# Patient Record
Sex: Male | Born: 1935 | Race: White | Hispanic: No | Marital: Married | Smoking: Former smoker
Health system: Southern US, Community
[De-identification: ages and names within clinical notes are randomized; demographics above are authoritative.]

## PROBLEM LIST (undated history)

## (undated) DIAGNOSIS — R7303 Prediabetes: Secondary | ICD-10-CM

## (undated) DIAGNOSIS — D689 Coagulation defect, unspecified: Secondary | ICD-10-CM

## (undated) DIAGNOSIS — G4730 Sleep apnea, unspecified: Secondary | ICD-10-CM

## (undated) DIAGNOSIS — I480 Paroxysmal atrial fibrillation: Secondary | ICD-10-CM

## (undated) DIAGNOSIS — M1990 Unspecified osteoarthritis, unspecified site: Secondary | ICD-10-CM

## (undated) DIAGNOSIS — N189 Chronic kidney disease, unspecified: Secondary | ICD-10-CM

## (undated) DIAGNOSIS — C801 Malignant (primary) neoplasm, unspecified: Secondary | ICD-10-CM

## (undated) DIAGNOSIS — I493 Ventricular premature depolarization: Secondary | ICD-10-CM

## (undated) DIAGNOSIS — I10 Essential (primary) hypertension: Secondary | ICD-10-CM

## (undated) DIAGNOSIS — Z8711 Personal history of peptic ulcer disease: Secondary | ICD-10-CM

## (undated) DIAGNOSIS — K5909 Other constipation: Secondary | ICD-10-CM

## (undated) DIAGNOSIS — R011 Cardiac murmur, unspecified: Secondary | ICD-10-CM

## (undated) DIAGNOSIS — Z8719 Personal history of other diseases of the digestive system: Secondary | ICD-10-CM

## (undated) DIAGNOSIS — T7840XA Allergy, unspecified, initial encounter: Secondary | ICD-10-CM

## (undated) DIAGNOSIS — M069 Rheumatoid arthritis, unspecified: Secondary | ICD-10-CM

## (undated) DIAGNOSIS — E785 Hyperlipidemia, unspecified: Secondary | ICD-10-CM

## (undated) DIAGNOSIS — N3020 Other chronic cystitis without hematuria: Secondary | ICD-10-CM

## (undated) DIAGNOSIS — Z9989 Dependence on other enabling machines and devices: Secondary | ICD-10-CM

## (undated) DIAGNOSIS — G4733 Obstructive sleep apnea (adult) (pediatric): Secondary | ICD-10-CM

## (undated) DIAGNOSIS — I4510 Unspecified right bundle-branch block: Secondary | ICD-10-CM

## (undated) DIAGNOSIS — Z7901 Long term (current) use of anticoagulants: Secondary | ICD-10-CM

## (undated) DIAGNOSIS — Z86718 Personal history of other venous thrombosis and embolism: Secondary | ICD-10-CM

## (undated) DIAGNOSIS — R310 Gross hematuria: Secondary | ICD-10-CM

## (undated) DIAGNOSIS — I509 Heart failure, unspecified: Secondary | ICD-10-CM

## (undated) DIAGNOSIS — Z87442 Personal history of urinary calculi: Secondary | ICD-10-CM

## (undated) DIAGNOSIS — H269 Unspecified cataract: Secondary | ICD-10-CM

## (undated) DIAGNOSIS — I428 Other cardiomyopathies: Secondary | ICD-10-CM

## (undated) DIAGNOSIS — Z974 Presence of external hearing-aid: Secondary | ICD-10-CM

## (undated) DIAGNOSIS — K219 Gastro-esophageal reflux disease without esophagitis: Secondary | ICD-10-CM

## (undated) DIAGNOSIS — E7800 Pure hypercholesterolemia, unspecified: Secondary | ICD-10-CM

## (undated) DIAGNOSIS — N35811 Other urethral stricture, male, meatal: Secondary | ICD-10-CM

## (undated) DIAGNOSIS — IMO0002 Reserved for concepts with insufficient information to code with codable children: Secondary | ICD-10-CM

## (undated) DIAGNOSIS — H3530 Unspecified macular degeneration: Secondary | ICD-10-CM

## (undated) DIAGNOSIS — N401 Enlarged prostate with lower urinary tract symptoms: Secondary | ICD-10-CM

## (undated) DIAGNOSIS — D649 Anemia, unspecified: Secondary | ICD-10-CM

## (undated) HISTORY — DX: Unspecified osteoarthritis, unspecified site: M19.90

## (undated) HISTORY — DX: Gastro-esophageal reflux disease without esophagitis: K21.9

## (undated) HISTORY — PX: JOINT REPLACEMENT: SHX530

## (undated) HISTORY — PX: TONSILLECTOMY AND ADENOIDECTOMY: SUR1326

## (undated) HISTORY — DX: Sleep apnea, unspecified: G47.30

## (undated) HISTORY — DX: Allergy, unspecified, initial encounter: T78.40XA

## (undated) HISTORY — PX: BUNIONECTOMY: SHX129

## (undated) HISTORY — DX: Unspecified right bundle-branch block: I45.10

## (undated) HISTORY — DX: Rheumatoid arthritis, unspecified: M06.9

## (undated) HISTORY — DX: Paroxysmal atrial fibrillation: I48.0

## (undated) HISTORY — DX: Prediabetes: R73.03

## (undated) HISTORY — PX: EYE SURGERY: SHX253

## (undated) HISTORY — DX: Reserved for concepts with insufficient information to code with codable children: IMO0002

## (undated) HISTORY — DX: Ventricular premature depolarization: I49.3

## (undated) HISTORY — DX: Hyperlipidemia, unspecified: E78.5

## (undated) HISTORY — DX: Heart failure, unspecified: I50.9

## (undated) HISTORY — DX: Cardiac murmur, unspecified: R01.1

## (undated) HISTORY — PX: TOTAL KNEE ARTHROPLASTY: SHX125

## (undated) HISTORY — PX: KNEE SURGERY: SHX244

## (undated) HISTORY — DX: Anemia, unspecified: D64.9

## (undated) HISTORY — DX: Long term (current) use of anticoagulants: Z79.01

## (undated) HISTORY — PX: DENTAL SURGERY: SHX609

## (undated) HISTORY — DX: Unspecified cataract: H26.9

## (undated) HISTORY — DX: Coagulation defect, unspecified: D68.9

---

## 1999-02-21 ENCOUNTER — Encounter: Payer: Self-pay | Admitting: Endocrinology

## 1999-02-21 ENCOUNTER — Ambulatory Visit (HOSPITAL_COMMUNITY): Admission: RE | Admit: 1999-02-21 | Discharge: 1999-02-21 | Payer: Self-pay | Admitting: Endocrinology

## 1999-05-09 ENCOUNTER — Encounter: Admission: RE | Admit: 1999-05-09 | Discharge: 1999-05-09 | Payer: Self-pay | Admitting: Endocrinology

## 1999-05-09 ENCOUNTER — Encounter: Payer: Self-pay | Admitting: Endocrinology

## 2000-04-26 ENCOUNTER — Encounter: Payer: Self-pay | Admitting: Neurology

## 2000-04-26 ENCOUNTER — Ambulatory Visit (HOSPITAL_COMMUNITY): Admission: RE | Admit: 2000-04-26 | Discharge: 2000-04-26 | Payer: Self-pay | Admitting: Neurology

## 2000-05-20 ENCOUNTER — Ambulatory Visit (HOSPITAL_COMMUNITY): Admission: RE | Admit: 2000-05-20 | Discharge: 2000-05-20 | Payer: Self-pay | Admitting: Gastroenterology

## 2000-05-20 ENCOUNTER — Encounter (INDEPENDENT_AMBULATORY_CARE_PROVIDER_SITE_OTHER): Payer: Self-pay | Admitting: *Deleted

## 2001-07-21 ENCOUNTER — Encounter: Payer: Self-pay | Admitting: Neurological Surgery

## 2001-07-21 ENCOUNTER — Ambulatory Visit (HOSPITAL_COMMUNITY): Admission: RE | Admit: 2001-07-21 | Discharge: 2001-07-21 | Payer: Self-pay | Admitting: Neurological Surgery

## 2001-08-14 ENCOUNTER — Encounter: Admission: RE | Admit: 2001-08-14 | Discharge: 2001-08-14 | Payer: Self-pay | Admitting: Neurological Surgery

## 2001-08-14 ENCOUNTER — Encounter: Payer: Self-pay | Admitting: Neurological Surgery

## 2001-12-28 ENCOUNTER — Inpatient Hospital Stay (HOSPITAL_COMMUNITY): Admission: AD | Admit: 2001-12-28 | Discharge: 2001-12-29 | Payer: Self-pay | Admitting: Endocrinology

## 2002-03-29 ENCOUNTER — Ambulatory Visit (HOSPITAL_COMMUNITY): Admission: RE | Admit: 2002-03-29 | Discharge: 2002-03-29 | Payer: Self-pay | Admitting: Gastroenterology

## 2002-03-29 ENCOUNTER — Encounter: Payer: Self-pay | Admitting: Gastroenterology

## 2002-06-22 ENCOUNTER — Encounter: Payer: Self-pay | Admitting: Urology

## 2002-06-28 ENCOUNTER — Ambulatory Visit (HOSPITAL_COMMUNITY): Admission: RE | Admit: 2002-06-28 | Discharge: 2002-06-28 | Payer: Self-pay | Admitting: Urology

## 2002-06-28 ENCOUNTER — Encounter: Payer: Self-pay | Admitting: Urology

## 2002-06-29 ENCOUNTER — Encounter (INDEPENDENT_AMBULATORY_CARE_PROVIDER_SITE_OTHER): Payer: Self-pay | Admitting: Specialist

## 2002-06-29 ENCOUNTER — Inpatient Hospital Stay (HOSPITAL_COMMUNITY): Admission: RE | Admit: 2002-06-29 | Discharge: 2002-07-03 | Payer: Self-pay | Admitting: Urology

## 2003-04-26 ENCOUNTER — Encounter: Admission: RE | Admit: 2003-04-26 | Discharge: 2003-07-06 | Payer: Self-pay | Admitting: Endocrinology

## 2004-07-04 ENCOUNTER — Observation Stay (HOSPITAL_COMMUNITY): Admission: EM | Admit: 2004-07-04 | Discharge: 2004-07-05 | Payer: Self-pay | Admitting: Emergency Medicine

## 2004-07-11 ENCOUNTER — Inpatient Hospital Stay (HOSPITAL_COMMUNITY): Admission: EM | Admit: 2004-07-11 | Discharge: 2004-07-13 | Payer: Self-pay | Admitting: Emergency Medicine

## 2007-08-28 LAB — IFOBT (OCCULT BLOOD): IFOBT: NEGATIVE

## 2008-04-25 ENCOUNTER — Inpatient Hospital Stay (HOSPITAL_COMMUNITY): Admission: AD | Admit: 2008-04-25 | Discharge: 2008-04-28 | Payer: Self-pay | Admitting: Cardiology

## 2008-04-25 ENCOUNTER — Encounter: Payer: Self-pay | Admitting: Cardiology

## 2008-05-20 ENCOUNTER — Ambulatory Visit (HOSPITAL_COMMUNITY): Admission: RE | Admit: 2008-05-20 | Discharge: 2008-05-20 | Payer: Self-pay | Admitting: Cardiology

## 2008-05-27 ENCOUNTER — Ambulatory Visit (HOSPITAL_COMMUNITY): Admission: RE | Admit: 2008-05-27 | Discharge: 2008-05-27 | Payer: Self-pay | Admitting: Orthopedic Surgery

## 2008-08-02 ENCOUNTER — Encounter: Admission: RE | Admit: 2008-08-02 | Discharge: 2008-08-02 | Payer: Self-pay | Admitting: Orthopedic Surgery

## 2008-09-02 ENCOUNTER — Encounter: Admission: RE | Admit: 2008-09-02 | Discharge: 2008-09-02 | Payer: Self-pay | Admitting: Orthopedic Surgery

## 2009-02-16 ENCOUNTER — Ambulatory Visit (HOSPITAL_COMMUNITY): Admission: RE | Admit: 2009-02-16 | Discharge: 2009-02-16 | Payer: Self-pay | Admitting: Orthopedic Surgery

## 2009-02-28 ENCOUNTER — Ambulatory Visit (HOSPITAL_COMMUNITY): Admission: RE | Admit: 2009-02-28 | Discharge: 2009-02-28 | Payer: Self-pay | Admitting: Orthopedic Surgery

## 2009-06-14 ENCOUNTER — Encounter: Admission: RE | Admit: 2009-06-14 | Discharge: 2009-06-14 | Payer: Self-pay | Admitting: Cardiology

## 2009-07-20 ENCOUNTER — Encounter: Admission: RE | Admit: 2009-07-20 | Discharge: 2009-07-20 | Payer: Self-pay | Admitting: Endocrinology

## 2009-11-11 ENCOUNTER — Ambulatory Visit: Payer: Self-pay | Admitting: Vascular Surgery

## 2009-11-11 ENCOUNTER — Encounter (INDEPENDENT_AMBULATORY_CARE_PROVIDER_SITE_OTHER): Payer: Self-pay | Admitting: Emergency Medicine

## 2009-11-11 ENCOUNTER — Emergency Department (HOSPITAL_BASED_OUTPATIENT_CLINIC_OR_DEPARTMENT_OTHER): Admission: EM | Admit: 2009-11-11 | Discharge: 2009-11-11 | Payer: Self-pay | Admitting: Emergency Medicine

## 2009-11-11 ENCOUNTER — Ambulatory Visit (HOSPITAL_COMMUNITY): Admission: RE | Admit: 2009-11-11 | Discharge: 2009-11-11 | Payer: Self-pay | Admitting: Emergency Medicine

## 2009-12-04 ENCOUNTER — Ambulatory Visit: Payer: Self-pay | Admitting: Cardiology

## 2009-12-11 ENCOUNTER — Ambulatory Visit: Payer: Self-pay | Admitting: Cardiology

## 2009-12-11 ENCOUNTER — Encounter: Admission: RE | Admit: 2009-12-11 | Discharge: 2009-12-11 | Payer: Self-pay | Admitting: Cardiology

## 2009-12-13 ENCOUNTER — Ambulatory Visit (HOSPITAL_COMMUNITY): Admission: RE | Admit: 2009-12-13 | Discharge: 2009-12-13 | Payer: Self-pay | Admitting: Cardiology

## 2009-12-27 ENCOUNTER — Ambulatory Visit: Payer: Self-pay | Admitting: Cardiology

## 2010-01-24 ENCOUNTER — Ambulatory Visit: Payer: Self-pay | Admitting: Cardiology

## 2010-01-29 ENCOUNTER — Ambulatory Visit: Payer: Self-pay | Admitting: Cardiology

## 2010-02-23 ENCOUNTER — Ambulatory Visit: Payer: Self-pay | Admitting: Cardiology

## 2010-03-21 ENCOUNTER — Ambulatory Visit: Payer: Self-pay | Admitting: Cardiology

## 2010-04-09 ENCOUNTER — Ambulatory Visit: Payer: Self-pay | Admitting: Cardiology

## 2010-04-11 LAB — HM COLONOSCOPY

## 2010-04-25 ENCOUNTER — Ambulatory Visit: Payer: Self-pay | Admitting: Cardiovascular Disease

## 2010-05-27 ENCOUNTER — Encounter: Payer: Self-pay | Admitting: Orthopedic Surgery

## 2010-05-30 ENCOUNTER — Ambulatory Visit: Payer: Self-pay | Admitting: Cardiology

## 2010-05-30 LAB — DIFFERENTIAL
Basophils Absolute: 0 10*3/uL (ref 0.0–0.1)
Basophils Relative: 0 % (ref 0–1)
Eosinophils Relative: 4 % (ref 0–5)
Lymphocytes Relative: 18 % (ref 12–46)
Lymphs Abs: 1.2 10*3/uL (ref 0.7–4.0)
Monocytes Absolute: 0.6 10*3/uL (ref 0.1–1.0)
Monocytes Relative: 9 % (ref 3–12)
Neutro Abs: 4.7 10*3/uL (ref 1.7–7.7)

## 2010-05-30 LAB — CBC
HCT: 42.9 % (ref 39.0–52.0)
MCH: 34.2 pg — ABNORMAL HIGH (ref 26.0–34.0)
MCHC: 32.9 g/dL (ref 30.0–36.0)
RBC: 4.12 MIL/uL — ABNORMAL LOW (ref 4.22–5.81)
RDW: 14 % (ref 11.5–15.5)
WBC: 6.7 10*3/uL (ref 4.0–10.5)

## 2010-05-30 LAB — URINALYSIS, ROUTINE W REFLEX MICROSCOPIC
Bilirubin Urine: NEGATIVE
Hgb urine dipstick: NEGATIVE
Ketones, ur: 15 mg/dL — AB
Specific Gravity, Urine: 1.037 — ABNORMAL HIGH (ref 1.005–1.030)
pH: 5 (ref 5.0–8.0)

## 2010-05-30 LAB — COMPREHENSIVE METABOLIC PANEL
AST: 33 U/L (ref 0–37)
Albumin: 4 g/dL (ref 3.5–5.2)
BUN: 21 mg/dL (ref 6–23)
Calcium: 8.9 mg/dL (ref 8.4–10.5)
Creatinine, Ser: 1.05 mg/dL (ref 0.4–1.5)
Glucose, Bld: 102 mg/dL — ABNORMAL HIGH (ref 70–99)
Sodium: 142 mEq/L (ref 135–145)
Total Bilirubin: 0.9 mg/dL (ref 0.3–1.2)
Total Protein: 6.5 g/dL (ref 6.0–8.3)

## 2010-05-30 LAB — URINE CULTURE: Culture  Setup Time: 201201241537

## 2010-05-30 LAB — PROTIME-INR: Prothrombin Time: 27.9 seconds — ABNORMAL HIGH (ref 11.6–15.2)

## 2010-05-30 LAB — SURGICAL PCR SCREEN: MRSA, PCR: NEGATIVE

## 2010-05-30 LAB — URINE MICROSCOPIC-ADD ON

## 2010-06-04 ENCOUNTER — Inpatient Hospital Stay (HOSPITAL_COMMUNITY)
Admission: RE | Admit: 2010-06-04 | Discharge: 2010-06-07 | DRG: 470 | Disposition: A | Discharge status: Skilled Nursing Facility | Payer: Medicare Other | Attending: Orthopedic Surgery | Admitting: Orthopedic Surgery

## 2010-06-04 DIAGNOSIS — I4891 Unspecified atrial fibrillation: Secondary | ICD-10-CM | POA: Diagnosis present

## 2010-06-04 DIAGNOSIS — M87059 Idiopathic aseptic necrosis of unspecified femur: Secondary | ICD-10-CM | POA: Diagnosis present

## 2010-06-04 DIAGNOSIS — R339 Retention of urine, unspecified: Secondary | ICD-10-CM | POA: Diagnosis present

## 2010-06-04 DIAGNOSIS — K5900 Constipation, unspecified: Secondary | ICD-10-CM | POA: Diagnosis not present

## 2010-06-04 DIAGNOSIS — R112 Nausea with vomiting, unspecified: Secondary | ICD-10-CM | POA: Diagnosis not present

## 2010-06-04 DIAGNOSIS — Z7901 Long term (current) use of anticoagulants: Secondary | ICD-10-CM

## 2010-06-04 DIAGNOSIS — M1610 Unilateral primary osteoarthritis, unspecified hip: Principal | ICD-10-CM | POA: Diagnosis present

## 2010-06-04 DIAGNOSIS — D62 Acute posthemorrhagic anemia: Secondary | ICD-10-CM | POA: Diagnosis not present

## 2010-06-04 DIAGNOSIS — N138 Other obstructive and reflux uropathy: Secondary | ICD-10-CM | POA: Diagnosis present

## 2010-06-04 DIAGNOSIS — I959 Hypotension, unspecified: Secondary | ICD-10-CM | POA: Diagnosis not present

## 2010-06-04 DIAGNOSIS — K219 Gastro-esophageal reflux disease without esophagitis: Secondary | ICD-10-CM | POA: Diagnosis present

## 2010-06-04 DIAGNOSIS — N401 Enlarged prostate with lower urinary tract symptoms: Secondary | ICD-10-CM | POA: Diagnosis present

## 2010-06-04 DIAGNOSIS — M169 Osteoarthritis of hip, unspecified: Principal | ICD-10-CM | POA: Diagnosis present

## 2010-06-04 DIAGNOSIS — E7800 Pure hypercholesterolemia, unspecified: Secondary | ICD-10-CM | POA: Diagnosis present

## 2010-06-04 DIAGNOSIS — Z7982 Long term (current) use of aspirin: Secondary | ICD-10-CM

## 2010-06-04 DIAGNOSIS — I10 Essential (primary) hypertension: Secondary | ICD-10-CM | POA: Diagnosis present

## 2010-06-04 LAB — PROTIME-INR
INR: 1.1 (ref 0.00–1.49)
Prothrombin Time: 14.4 seconds (ref 11.6–15.2)

## 2010-06-04 LAB — APTT: aPTT: 34 seconds (ref 24–37)

## 2010-06-04 LAB — ABO/RH: ABO/RH(D): O POS

## 2010-06-05 LAB — CBC
HCT: 35.6 % — ABNORMAL LOW (ref 39.0–52.0)
Hemoglobin: 11.8 g/dL — ABNORMAL LOW (ref 13.0–17.0)
MCHC: 33.1 g/dL (ref 30.0–36.0)
Platelets: 163 10*3/uL (ref 150–400)
RBC: 3.44 MIL/uL — ABNORMAL LOW (ref 4.22–5.81)
RDW: 13.7 % (ref 11.5–15.5)
WBC: 11.4 10*3/uL — ABNORMAL HIGH (ref 4.0–10.5)

## 2010-06-05 LAB — BASIC METABOLIC PANEL
BUN: 17 mg/dL (ref 6–23)
CO2: 32 mEq/L (ref 19–32)
Chloride: 103 mEq/L (ref 96–112)
GFR calc Af Amer: >60 mL/min (ref >60)
Potassium: 4.3 mEq/L (ref 3.5–5.1)

## 2010-06-05 LAB — PROTIME-INR: Prothrombin Time: 13.5 seconds (ref 11.6–15.2)

## 2010-06-06 LAB — BASIC METABOLIC PANEL
BUN: 14 mg/dL (ref 6–23)
CO2: 29 mEq/L (ref 19–32)
GFR calc Af Amer: >60 mL/min (ref >60)
GFR calc non Af Amer: >60 mL/min (ref >60)
Glucose, Bld: 135 mg/dL — ABNORMAL HIGH (ref 70–99)
Sodium: 137 mEq/L (ref 135–145)

## 2010-06-06 LAB — CBC
HCT: 26.6 % — ABNORMAL LOW (ref 39.0–52.0)
Hemoglobin: 8.9 g/dL — ABNORMAL LOW (ref 13.0–17.0)
WBC: 10 10*3/uL (ref 4.0–10.5)

## 2010-06-06 LAB — PROTIME-INR: Prothrombin Time: 16.3 seconds — ABNORMAL HIGH (ref 11.6–15.2)

## 2010-06-06 NOTE — Op Note (Signed)
NAME:  Connor [name], Connor [name] NO.:  [id number]  MEDICAL RECORD NO.:  [id number]          PATIENT TYPE:  INP  LOCATION:  5018                         FACILITY:  [facility]  PHYSICIAN:  Elana Alm. Thurston Hole, M.D. DATE OF BIRTH:  Oct 17, 1933  DATE OF PROCEDURE:  06/04/2010 DATE OF DISCHARGE:                              OPERATIVE REPORT   PREOPERATIVE DIAGNOSIS:  Right hip degenerative joint disease.  POSTOPERATIVE DIAGNOSIS:  Right hip degenerative joint disease.  PROCEDURE:  Right total hip replacement using DePuy Summit Press-Fit total hip prosthesis with 58-mm Press-Fit Pinnacle cup with two locking screws and 10-degree polyethylene liner.  Femoral component #5 Press-Fit Summit stem with a high offset neck with +5 x 36-mm femoral head.  SURGEON:  Elana Alm. Thurston Hole, MD  ASSISTANT:  Julien Girt, PA  ANESTHESIA:  General.  OPERATIVE TIME:  One hour and 20 minutes.  ESTIMATED BLOOD LOSS:  250 mL.  COMPLICATIONS:  None.  DESCRIPTION OF PROCEDURE:  Connor [name] was brought to the operating room on June 04, 2010, placed on the operating table in supine position. He received vancomycin 1 g IV preoperatively for prophylaxis.  After being placed under general anesthesia, he had a Foley catheter placed under sterile conditions.  His right hip was examined.  He had flexion to 90, extension to zero, internal and external rotation of 20 degrees with approximately 1 inch of shortening of the right leg compared to the left.  He was then turned to the right lateral decubitus position and secured on the bed with a Stulberg frame.  His right hip and leg was prepped using sterile DuraPrep and draped using sterile technique.  Time- out procedure was called, and the correct right hip identified. Initially, after being sterilely prepped and draped through a 15-cm posterolateral greater trochanteric incision, initial exposure was made. The underlying subcutaneous tissues were  incised along with skin incision.  Iliotibial band and gluteus maximus fascia was incised longitudinally revealing the underlying sciatic nerve which was carefully protected.  The short external rotators of the hip and hip capsule were released off the femoral neck insertion and tagged and then the hip was posteriorly dislocated.  He was found to have a deformed femoral head with grade 3 and 4 DJD.  Femoral neck cut was then made one- half to 2 cm above the lesser trochanter in the appropriate manner of anteversion, abduction, inclination.  At this point, the acetabulum was carefully exposed with retractors.  Degenerative acetabular labrum was resected.  After this was done, then grade 3 and 4 DJD was noted in the acetabulum.  Sequential acetabular reaming was then carried out in the appropriate manner of abduction, anteversion, and inclination up to a 57- mm reamer and then a 58-mm acetabular trial was hammered into position with an excellent fit.  It was then removed and the actual 58-mm Pinnacle cup was hammered into position in the appropriate manner of anteversion, abduction, and inclination with an excellent fit and then two locking screws, one in the 10 o'clock and one in the 12 o'clock position were placed.  A 10-degree polyethylene liner was then placed  with the 10-degree lip in the posterolateral position.  At this point, the proximal femur was exposed.  Sequential reaming was carried out of the femoral canal up to a #5 size and broaching to a #5 size, and with the #5 broach high offset in place and a +5 x 36-mm femoral head, the hip was reduced.  Taken through excellent range of motion with excellent stability up to 80 degrees of internal rotation in both neutral and 30 degrees of adduction and also stable in abduction and external rotation, and leg lengths were equalized.  At this point, it was felt the femoral trial was excellent size, fit, stability.  It was then removed.   The femoral canal was then jet lavage irrigated and then the #5 Summit femoral stem was hammered into position with an excellent fit in the appropriate manner of anteversion with a high offset neck and then a +5 x 36-mm femoral head was placed on the femoral neck and hammered onto position with an excellent Morse taper fit.  The hip was then reduced, again taken through a range of motion, found to be stable and leg lengths equal.  At this point, it was felt that all the components were of excellent size, fit, and stability.  The wound was further irrigated, and the short external rotators of the hip and hip capsule were reattached to their femoral neck insertion through two drill holes in the greater trochanter.  Iliotibial band and gluteus maximus fascia was then closed with #1 Ethibond suture, subcutaneous tissues closed with 0 and 2-0 Vicryl, subcuticular layer closed with 4-0 Monocryl.  Sterile dressings were then applied and an abduction pillow.  The patient was then turned supine, checked for leg lengths that were equal, rotation equal, pulses 2+ and symmetric.  He was then awakened, extubated, taken to recovery room in stable condition.  Needle and sponge counts correct x2 at the end of the case.     Rima Blizzard A. Thurston Hole, M.D.     RAW/MEDQ  D:  06/04/2010  T:  06/05/2010  Job:  [id number]  Electronically Signed by Salvatore Marvel M.D. on 06/06/2010 10:46:35 AM

## 2010-06-07 LAB — BASIC METABOLIC PANEL
BUN: 11 mg/dL (ref 6–23)
CO2: 27 mEq/L (ref 19–32)
Chloride: 101 mEq/L (ref 96–112)
GFR calc non Af Amer: >60 mL/min (ref >60)
Glucose, Bld: 125 mg/dL — ABNORMAL HIGH (ref 70–99)
Potassium: 3.7 mEq/L (ref 3.5–5.1)
Sodium: 137 mEq/L (ref 135–145)

## 2010-06-07 LAB — CBC
HCT: 23.8 % — ABNORMAL LOW (ref 39.0–52.0)
Hemoglobin: 8.1 g/dL — ABNORMAL LOW (ref 13.0–17.0)
MCH: 34.2 pg — ABNORMAL HIGH (ref 26.0–34.0)
MCHC: 34 g/dL (ref 30.0–36.0)
MCV: 100.4 fL — ABNORMAL HIGH (ref 78.0–100.0)
RBC: 2.37 MIL/uL — ABNORMAL LOW (ref 4.22–5.81)

## 2010-06-07 LAB — GLUCOSE, CAPILLARY

## 2010-06-07 LAB — PROTIME-INR: Prothrombin Time: 19.4 seconds — ABNORMAL HIGH (ref 11.6–15.2)

## 2010-06-08 ENCOUNTER — Emergency Department (HOSPITAL_COMMUNITY): Payer: Medicare Other

## 2010-06-08 ENCOUNTER — Inpatient Hospital Stay (HOSPITAL_COMMUNITY)
Admission: EM | Admit: 2010-06-08 | Discharge: 2010-06-14 | DRG: 309 | Disposition: A | Discharge status: Rehabilitation Facility | Payer: Medicare Other | Attending: Family Medicine | Admitting: Family Medicine

## 2010-06-08 DIAGNOSIS — N138 Other obstructive and reflux uropathy: Secondary | ICD-10-CM | POA: Diagnosis present

## 2010-06-08 DIAGNOSIS — Z7982 Long term (current) use of aspirin: Secondary | ICD-10-CM

## 2010-06-08 DIAGNOSIS — N401 Enlarged prostate with lower urinary tract symptoms: Secondary | ICD-10-CM | POA: Diagnosis present

## 2010-06-08 DIAGNOSIS — M1610 Unilateral primary osteoarthritis, unspecified hip: Secondary | ICD-10-CM | POA: Diagnosis present

## 2010-06-08 DIAGNOSIS — I4892 Unspecified atrial flutter: Principal | ICD-10-CM | POA: Diagnosis present

## 2010-06-08 DIAGNOSIS — Z7901 Long term (current) use of anticoagulants: Secondary | ICD-10-CM

## 2010-06-08 DIAGNOSIS — Z96649 Presence of unspecified artificial hip joint: Secondary | ICD-10-CM

## 2010-06-08 DIAGNOSIS — I824Y9 Acute embolism and thrombosis of unspecified deep veins of unspecified proximal lower extremity: Secondary | ICD-10-CM | POA: Diagnosis present

## 2010-06-08 DIAGNOSIS — Z8249 Family history of ischemic heart disease and other diseases of the circulatory system: Secondary | ICD-10-CM

## 2010-06-08 DIAGNOSIS — I10 Essential (primary) hypertension: Secondary | ICD-10-CM | POA: Diagnosis present

## 2010-06-08 DIAGNOSIS — R791 Abnormal coagulation profile: Secondary | ICD-10-CM | POA: Diagnosis present

## 2010-06-08 DIAGNOSIS — Z8711 Personal history of peptic ulcer disease: Secondary | ICD-10-CM

## 2010-06-08 DIAGNOSIS — D62 Acute posthemorrhagic anemia: Secondary | ICD-10-CM | POA: Diagnosis present

## 2010-06-08 DIAGNOSIS — E876 Hypokalemia: Secondary | ICD-10-CM | POA: Diagnosis present

## 2010-06-08 DIAGNOSIS — Z87891 Personal history of nicotine dependence: Secondary | ICD-10-CM

## 2010-06-08 DIAGNOSIS — M169 Osteoarthritis of hip, unspecified: Secondary | ICD-10-CM | POA: Diagnosis present

## 2010-06-08 DIAGNOSIS — I4891 Unspecified atrial fibrillation: Secondary | ICD-10-CM | POA: Diagnosis present

## 2010-06-08 DIAGNOSIS — I4510 Unspecified right bundle-branch block: Secondary | ICD-10-CM | POA: Diagnosis present

## 2010-06-08 DIAGNOSIS — R945 Abnormal results of liver function studies: Secondary | ICD-10-CM | POA: Diagnosis present

## 2010-06-08 DIAGNOSIS — Z86718 Personal history of other venous thrombosis and embolism: Secondary | ICD-10-CM

## 2010-06-08 DIAGNOSIS — E7800 Pure hypercholesterolemia, unspecified: Secondary | ICD-10-CM | POA: Diagnosis present

## 2010-06-08 DIAGNOSIS — T45515A Adverse effect of anticoagulants, initial encounter: Secondary | ICD-10-CM | POA: Diagnosis present

## 2010-06-08 DIAGNOSIS — I959 Hypotension, unspecified: Secondary | ICD-10-CM | POA: Diagnosis present

## 2010-06-08 DIAGNOSIS — E785 Hyperlipidemia, unspecified: Secondary | ICD-10-CM | POA: Diagnosis present

## 2010-06-08 DIAGNOSIS — Z880 Allergy status to penicillin: Secondary | ICD-10-CM

## 2010-06-08 DIAGNOSIS — K5900 Constipation, unspecified: Secondary | ICD-10-CM | POA: Diagnosis present

## 2010-06-08 DIAGNOSIS — R339 Retention of urine, unspecified: Secondary | ICD-10-CM | POA: Diagnosis present

## 2010-06-08 DIAGNOSIS — K219 Gastro-esophageal reflux disease without esophagitis: Secondary | ICD-10-CM | POA: Diagnosis present

## 2010-06-08 HISTORY — DX: Personal history of other venous thrombosis and embolism: Z86.718

## 2010-06-08 HISTORY — DX: Essential (primary) hypertension: I10

## 2010-06-08 LAB — DIFFERENTIAL
Basophils Absolute: 0 K/uL (ref 0.0–0.1)
Basophils Relative: 0 % (ref 0–1)
Eosinophils Absolute: 0.1 K/uL (ref 0.0–0.7)
Eosinophils Relative: 1 % (ref 0–5)
Lymphocytes Relative: 8 % — ABNORMAL LOW (ref 12–46)
Lymphs Abs: 0.6 K/uL — ABNORMAL LOW (ref 0.7–4.0)
Monocytes Absolute: 0.9 K/uL (ref 0.1–1.0)
Monocytes Relative: 11 % (ref 3–12)
Neutro Abs: 6.2 K/uL (ref 1.7–7.7)
Neutrophils Relative %: 79 % — ABNORMAL HIGH (ref 43–77)

## 2010-06-08 LAB — TYPE AND SCREEN: ABO/RH(D): O POS

## 2010-06-08 LAB — CBC
HCT: 29.4 % — ABNORMAL LOW (ref 39.0–52.0)
MCHC: 33.3 g/dL (ref 30.0–36.0)
MCV: 97 fL (ref 78.0–100.0)
Platelets: 155 10*3/uL (ref 150–400)
RDW: 15.8 % — ABNORMAL HIGH (ref 11.5–15.5)

## 2010-06-08 LAB — POCT CARDIAC MARKERS
CKMB, poc: <1 ng/mL — ABNORMAL LOW (ref 1.0–8.0)
Troponin i, poc: <0.05 ng/mL (ref 0.00–0.09)

## 2010-06-08 LAB — BASIC METABOLIC PANEL
BUN: 9 mg/dL (ref 6–23)
CO2: 30 mEq/L (ref 19–32)
Calcium: 8.1 mg/dL — ABNORMAL LOW (ref 8.4–10.5)
Chloride: 101 mEq/L (ref 96–112)
Creatinine, Ser: 0.71 mg/dL (ref 0.4–1.5)

## 2010-06-08 LAB — PROTIME-INR: INR: 1.82 — ABNORMAL HIGH (ref 0.00–1.49)

## 2010-06-09 DIAGNOSIS — M7989 Other specified soft tissue disorders: Secondary | ICD-10-CM

## 2010-06-09 LAB — COMPREHENSIVE METABOLIC PANEL
ALT: 68 U/L — ABNORMAL HIGH (ref 0–53)
Calcium: 8.2 mg/dL — ABNORMAL LOW (ref 8.4–10.5)
GFR calc Af Amer: >60 mL/min (ref >60)
Glucose, Bld: 110 mg/dL — ABNORMAL HIGH (ref 70–99)
Sodium: 143 mEq/L (ref 135–145)
Total Protein: 5 g/dL — ABNORMAL LOW (ref 6.0–8.3)

## 2010-06-09 LAB — DIFFERENTIAL
Lymphs Abs: 1.1 10*3/uL (ref 0.7–4.0)
Monocytes Relative: 14 % — ABNORMAL HIGH (ref 3–12)
Neutro Abs: 4.7 10*3/uL (ref 1.7–7.7)
Neutrophils Relative %: 66 % (ref 43–77)

## 2010-06-09 LAB — CARDIAC PANEL(CRET KIN+CKTOT+MB+TROPI)
CK, MB: 1 ng/mL (ref 0.3–4.0)
Relative Index: 0.5 (ref 0.0–2.5)
Relative Index: 0.5 (ref 0.0–2.5)
Total CK: 219 U/L (ref 7–232)
Troponin I: 0.04 ng/mL (ref 0.00–0.06)

## 2010-06-09 LAB — CBC
HCT: 28 % — ABNORMAL LOW (ref 39.0–52.0)
Hemoglobin: 9.3 g/dL — ABNORMAL LOW (ref 13.0–17.0)
MCH: 32.2 pg (ref 26.0–34.0)
MCV: 96.9 fL (ref 78.0–100.0)
RBC: 2.89 MIL/uL — ABNORMAL LOW (ref 4.22–5.81)

## 2010-06-09 LAB — PROTIME-INR: Prothrombin Time: 24.1 seconds — ABNORMAL HIGH (ref 11.6–15.2)

## 2010-06-10 DIAGNOSIS — I959 Hypotension, unspecified: Secondary | ICD-10-CM

## 2010-06-10 DIAGNOSIS — I4892 Unspecified atrial flutter: Secondary | ICD-10-CM

## 2010-06-10 LAB — CBC
HCT: 31.2 % — ABNORMAL LOW (ref 39.0–52.0)
MCH: 32.5 pg (ref 26.0–34.0)
MCV: 100.3 fL — ABNORMAL HIGH (ref 78.0–100.0)
RBC: 3.11 MIL/uL — ABNORMAL LOW (ref 4.22–5.81)
WBC: 7.2 10*3/uL (ref 4.0–10.5)

## 2010-06-10 LAB — BASIC METABOLIC PANEL
BUN: 8 mg/dL (ref 6–23)
CO2: 30 mEq/L (ref 19–32)
Chloride: 102 mEq/L (ref 96–112)
Creatinine, Ser: 0.71 mg/dL (ref 0.4–1.5)
Glucose, Bld: 118 mg/dL — ABNORMAL HIGH (ref 70–99)
Potassium: 3.3 mEq/L — ABNORMAL LOW (ref 3.5–5.1)

## 2010-06-10 LAB — DIFFERENTIAL
Eosinophils Absolute: 0.3 10*3/uL (ref 0.0–0.7)
Monocytes Absolute: 0.8 10*3/uL (ref 0.1–1.0)
Neutro Abs: 5.1 10*3/uL (ref 1.7–7.7)

## 2010-06-10 LAB — PROTIME-INR: Prothrombin Time: 31.4 seconds — ABNORMAL HIGH (ref 11.6–15.2)

## 2010-06-11 DIAGNOSIS — R072 Precordial pain: Secondary | ICD-10-CM

## 2010-06-11 DIAGNOSIS — I80299 Phlebitis and thrombophlebitis of other deep vessels of unspecified lower extremity: Secondary | ICD-10-CM

## 2010-06-11 LAB — COMPREHENSIVE METABOLIC PANEL
AST: 58 U/L — ABNORMAL HIGH (ref 0–37)
BUN: 8 mg/dL (ref 6–23)
CO2: 28 mEq/L (ref 19–32)
Calcium: 7.9 mg/dL — ABNORMAL LOW (ref 8.4–10.5)
Chloride: 105 mEq/L (ref 96–112)
Creatinine, Ser: 0.67 mg/dL (ref 0.4–1.5)
GFR calc non Af Amer: >60 mL/min (ref >60)
Glucose, Bld: 102 mg/dL — ABNORMAL HIGH (ref 70–99)
Total Bilirubin: 1.4 mg/dL — ABNORMAL HIGH (ref 0.3–1.2)

## 2010-06-11 LAB — CBC
HCT: 27.9 % — ABNORMAL LOW (ref 39.0–52.0)
MCH: 32.1 pg (ref 26.0–34.0)
MCHC: 31.9 g/dL (ref 30.0–36.0)
MCV: 100.7 fL — ABNORMAL HIGH (ref 78.0–100.0)
Platelets: 222 10*3/uL (ref 150–400)
RDW: 16 % — ABNORMAL HIGH (ref 11.5–15.5)
WBC: 8.7 10*3/uL (ref 4.0–10.5)

## 2010-06-11 LAB — TSH: TSH: 2.026 u[IU]/mL (ref 0.350–4.500)

## 2010-06-11 LAB — HEMOGLOBIN AND HEMATOCRIT, BLOOD
HCT: 28.4 % — ABNORMAL LOW (ref 39.0–52.0)
Hemoglobin: 9.1 g/dL — ABNORMAL LOW (ref 13.0–17.0)

## 2010-06-12 ENCOUNTER — Inpatient Hospital Stay (HOSPITAL_COMMUNITY): Payer: Medicare Other

## 2010-06-12 ENCOUNTER — Encounter (HOSPITAL_COMMUNITY): Payer: Self-pay | Admitting: Radiology

## 2010-06-12 DIAGNOSIS — I4892 Unspecified atrial flutter: Principal | ICD-10-CM | POA: Insufficient documentation

## 2010-06-12 DIAGNOSIS — I4891 Unspecified atrial fibrillation: Secondary | ICD-10-CM

## 2010-06-12 LAB — URINALYSIS, MICROSCOPIC ONLY
Bilirubin Urine: NEGATIVE
Hgb urine dipstick: NEGATIVE
Ketones, ur: NEGATIVE mg/dL
Protein, ur: NEGATIVE mg/dL
Urine Glucose, Fasting: NEGATIVE mg/dL

## 2010-06-12 LAB — COMPREHENSIVE METABOLIC PANEL
AST: 74 U/L — ABNORMAL HIGH (ref 0–37)
CO2: 31 mEq/L (ref 19–32)
Calcium: 8.1 mg/dL — ABNORMAL LOW (ref 8.4–10.5)
Creatinine, Ser: 0.73 mg/dL (ref 0.4–1.5)
GFR calc Af Amer: >60 mL/min (ref >60)
GFR calc non Af Amer: >60 mL/min (ref >60)
Glucose, Bld: 132 mg/dL — ABNORMAL HIGH (ref 70–99)
Total Protein: 5.1 g/dL — ABNORMAL LOW (ref 6.0–8.3)

## 2010-06-12 LAB — CBC
Hemoglobin: 9 g/dL — ABNORMAL LOW (ref 13.0–17.0)
MCH: 33 pg (ref 26.0–34.0)
MCHC: 32.5 g/dL (ref 30.0–36.0)
RDW: 16.2 % — ABNORMAL HIGH (ref 11.5–15.5)

## 2010-06-12 LAB — PROTIME-INR: Prothrombin Time: 28 seconds — ABNORMAL HIGH (ref 11.6–15.2)

## 2010-06-12 MED ORDER — IOHEXOL 300 MG/ML  SOLN
100.0000 mL | Freq: Once | INTRAMUSCULAR | Status: AC | PRN
  Administered 2010-06-12: 100 mL via INTRAVENOUS

## 2010-06-13 LAB — BASIC METABOLIC PANEL
Chloride: 99 mEq/L (ref 96–112)
Creatinine, Ser: 0.78 mg/dL (ref 0.4–1.5)
GFR calc Af Amer: >60 mL/min (ref >60)
Potassium: 4 mEq/L (ref 3.5–5.1)
Sodium: 140 mEq/L (ref 135–145)

## 2010-06-13 LAB — CBC
MCH: 32.2 pg (ref 26.0–34.0)
Platelets: 286 10*3/uL (ref 150–400)
RBC: 3.17 MIL/uL — ABNORMAL LOW (ref 4.22–5.81)
WBC: 10.1 10*3/uL (ref 4.0–10.5)

## 2010-06-13 LAB — HEPATIC FUNCTION PANEL
Albumin: 2.2 g/dL — ABNORMAL LOW (ref 3.5–5.2)
Alkaline Phosphatase: 54 U/L (ref 39–117)
Total Protein: 5.6 g/dL — ABNORMAL LOW (ref 6.0–8.3)

## 2010-06-13 LAB — CROSSMATCH
ABO/RH(D): O POS
Antibody Screen: NEGATIVE
Unit division: 0

## 2010-06-13 LAB — PROTIME-INR
INR: 2.19 — ABNORMAL HIGH (ref 0.00–1.49)
Prothrombin Time: 24.5 seconds — ABNORMAL HIGH (ref 11.6–15.2)

## 2010-06-14 ENCOUNTER — Inpatient Hospital Stay (HOSPITAL_COMMUNITY)
Admission: RE | Admit: 2010-06-14 | Discharge: 2010-06-20 | DRG: 945 | Disposition: A | Discharge status: Home Health | Payer: Medicare Other | Source: Other Acute Inpatient Hospital | Attending: Physical Medicine & Rehabilitation | Admitting: Physical Medicine & Rehabilitation

## 2010-06-14 DIAGNOSIS — Z7982 Long term (current) use of aspirin: Secondary | ICD-10-CM

## 2010-06-14 DIAGNOSIS — M5137 Other intervertebral disc degeneration, lumbosacral region: Secondary | ICD-10-CM

## 2010-06-14 DIAGNOSIS — R339 Retention of urine, unspecified: Secondary | ICD-10-CM

## 2010-06-14 DIAGNOSIS — I4892 Unspecified atrial flutter: Secondary | ICD-10-CM

## 2010-06-14 DIAGNOSIS — Z96649 Presence of unspecified artificial hip joint: Secondary | ICD-10-CM

## 2010-06-14 DIAGNOSIS — Z7901 Long term (current) use of anticoagulants: Secondary | ICD-10-CM

## 2010-06-14 DIAGNOSIS — E876 Hypokalemia: Secondary | ICD-10-CM

## 2010-06-14 DIAGNOSIS — M51379 Other intervertebral disc degeneration, lumbosacral region without mention of lumbar back pain or lower extremity pain: Secondary | ICD-10-CM

## 2010-06-14 DIAGNOSIS — M1610 Unilateral primary osteoarthritis, unspecified hip: Secondary | ICD-10-CM

## 2010-06-14 DIAGNOSIS — E785 Hyperlipidemia, unspecified: Secondary | ICD-10-CM

## 2010-06-14 DIAGNOSIS — Z5189 Encounter for other specified aftercare: Principal | ICD-10-CM

## 2010-06-14 DIAGNOSIS — Z87442 Personal history of urinary calculi: Secondary | ICD-10-CM

## 2010-06-14 DIAGNOSIS — E7800 Pure hypercholesterolemia, unspecified: Secondary | ICD-10-CM

## 2010-06-14 DIAGNOSIS — Z8249 Family history of ischemic heart disease and other diseases of the circulatory system: Secondary | ICD-10-CM

## 2010-06-14 DIAGNOSIS — N138 Other obstructive and reflux uropathy: Secondary | ICD-10-CM

## 2010-06-14 DIAGNOSIS — Z87891 Personal history of nicotine dependence: Secondary | ICD-10-CM

## 2010-06-14 DIAGNOSIS — I10 Essential (primary) hypertension: Secondary | ICD-10-CM

## 2010-06-14 DIAGNOSIS — D62 Acute posthemorrhagic anemia: Secondary | ICD-10-CM

## 2010-06-14 DIAGNOSIS — M5030 Other cervical disc degeneration, unspecified cervical region: Secondary | ICD-10-CM

## 2010-06-14 DIAGNOSIS — Z96659 Presence of unspecified artificial knee joint: Secondary | ICD-10-CM

## 2010-06-14 DIAGNOSIS — I4891 Unspecified atrial fibrillation: Secondary | ICD-10-CM

## 2010-06-14 DIAGNOSIS — N401 Enlarged prostate with lower urinary tract symptoms: Secondary | ICD-10-CM

## 2010-06-14 DIAGNOSIS — I824Y9 Acute embolism and thrombosis of unspecified deep veins of unspecified proximal lower extremity: Secondary | ICD-10-CM

## 2010-06-14 DIAGNOSIS — Z880 Allergy status to penicillin: Secondary | ICD-10-CM

## 2010-06-14 DIAGNOSIS — M169 Osteoarthritis of hip, unspecified: Secondary | ICD-10-CM

## 2010-06-14 LAB — COMPREHENSIVE METABOLIC PANEL
BUN: 11 mg/dL (ref 6–23)
CO2: 31 mEq/L (ref 19–32)
Calcium: 7.8 mg/dL — ABNORMAL LOW (ref 8.4–10.5)
Creatinine, Ser: 0.79 mg/dL (ref 0.4–1.5)
GFR calc non Af Amer: >60 mL/min (ref >60)
Glucose, Bld: 111 mg/dL — ABNORMAL HIGH (ref 70–99)

## 2010-06-14 LAB — CBC
MCH: 32.3 pg (ref 26.0–34.0)
MCV: 100 fL (ref 78.0–100.0)
Platelets: 299 10*3/uL (ref 150–400)
RDW: 16.7 % — ABNORMAL HIGH (ref 11.5–15.5)
WBC: 7.4 10*3/uL (ref 4.0–10.5)

## 2010-06-15 DIAGNOSIS — S68419A Complete traumatic amputation of unspecified hand at wrist level, initial encounter: Secondary | ICD-10-CM

## 2010-06-15 DIAGNOSIS — M1610 Unilateral primary osteoarthritis, unspecified hip: Secondary | ICD-10-CM

## 2010-06-15 LAB — COMPREHENSIVE METABOLIC PANEL
AST: 58 U/L — ABNORMAL HIGH (ref 0–37)
Albumin: 2.1 g/dL — ABNORMAL LOW (ref 3.5–5.2)
Calcium: 8.1 mg/dL — ABNORMAL LOW (ref 8.4–10.5)
Creatinine, Ser: 0.74 mg/dL (ref 0.4–1.5)
GFR calc Af Amer: >60 mL/min (ref >60)
GFR calc non Af Amer: >60 mL/min (ref >60)
Sodium: 140 mEq/L (ref 135–145)
Total Protein: 4.9 g/dL — ABNORMAL LOW (ref 6.0–8.3)

## 2010-06-15 LAB — URINE CULTURE
Colony Count: 15000
Culture  Setup Time: 201202071949

## 2010-06-15 LAB — DIFFERENTIAL
Lymphocytes Relative: 14 % (ref 12–46)
Lymphs Abs: 1 10*3/uL (ref 0.7–4.0)
Neutrophils Relative %: 69 % (ref 43–77)

## 2010-06-15 LAB — CBC
Platelets: 327 10*3/uL (ref 150–400)
RBC: 2.71 MIL/uL — ABNORMAL LOW (ref 4.22–5.81)
RDW: 16.4 % — ABNORMAL HIGH (ref 11.5–15.5)
WBC: 7 10*3/uL (ref 4.0–10.5)

## 2010-06-15 LAB — PROTIME-INR
INR: 2.62 — ABNORMAL HIGH (ref 0.00–1.49)
Prothrombin Time: 28.1 seconds — ABNORMAL HIGH (ref 11.6–15.2)

## 2010-06-17 LAB — PROTIME-INR
INR: 3.08 — ABNORMAL HIGH (ref 0.00–1.49)
Prothrombin Time: 31.8 seconds — ABNORMAL HIGH (ref 11.6–15.2)

## 2010-06-17 NOTE — H&P (Addendum)
NAME:  Connor [name], Connor [name] NO.:  [id number]  MEDICAL RECORD NO.:  [id number]           PATIENT TYPE:  I  LOCATION:  4039                         FACILITY:  [facility]  PHYSICIAN:  Ranelle Oyster, M.D.DATE OF BIRTH:  [birth year]  DATE OF ADMISSION:  06/14/2010 DATE OF DISCHARGE:                             HISTORY & PHYSICAL   CHIEF COMPLAINT:  Lower extremity pain.  HISTORY OF PRESENT ILLNESS:  A 77 year old male with history of hypertension, atrial flutter, DJD of the right hip, underwent a right total hip replacement on June 05, 2010, was discharged to a nursing facility on June 07, 2010, but was readmitted to the hospital on June 08, 2010 with palpitations.  Further workup revealed atrial flutter.  He was treated initially with amiodarone, has some hypotension.  [facility] was consulted for recurrent atrial fibrillation and bladder, underwent TEE as well as cardioversion on June 13, 2010 per Dr. Shirlee Latch.  He was noted to have right lower extremity edema at admission.  A right lower extremity Doppler showed positive thrombus in the right popliteal vein to the posterior tibial, started on IV heparin.  CT angiogram of the chest showed no evidence of pulmonary embolism, but it did show some bilateral lower lobe atelectasis.  He had some distal esophageal thickening suspicious for esophagitis.  He had elevated liver function tests, abnormal ultrasound of the gallbladder showing cholelithiasis, but no cholecystitis.  He had acute blood loss anemia.  Cardiology recommended discontinuing aspirin. Physical therapy and occupational therapy were ordered because persistent functional deficits in combination with his medical problems. Physical Medicine and rehabilitation was consulted and felt to be good rehab candidate.  He is on partial weightbearing precautions on the right lower extremity.  REVIEW OF SYSTEMS:  Positive for pain in hip as well as  weakness as well as swelling in the right lower extremity.  PAST HISTORY: 1. Benign prostatic hypertrophy with uropathy,  I and O cath in the     past. 2. Recurrent AFib, status post ablation. 3. History of GI bleed in 2003. 4. 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 5. Renal calculi. 6. Degenerative disk disease C4 through C7 C-spine. 7. 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  FAMILY HISTORY:  CAD and AFib.  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  EtOH, glass of wine nightly.  FUNCTIONAL HISTORY:  Independent driving prior to June 05, 2010.  HOME MEDICATIONS:  Amiodarone, Coumadin, metoprolol, Cardizem, Lipitor, I-Caps, MVI, aspirin, Dyazide.  RECENT LABORATORY DATA:  LFTs; AST 45, ALT 77, total bili 1.4.  BUN on June 14, 2010 11, creatinine 0.79, sodium 138, potassium 3.7. Hemoglobin 8.7, platelets 299,000 and white count is 7.4.  PHYSICAL EXAMINATION:  VITAL SIGNS:  Blood pressure 118/66, pulse 83, respirations 18, temperature is 97.4. GENERAL:  No acute stress. EYES:  Anicteric, noninjected. EXTERNAL ENT:  Normal. NECK:  Supple without adenopathy. LUNGS:  Respiratory effort is good.  Lungs are clear. HEART:  Regular rate and rhythm.  No rubs, murmurs or extra sounds. ABDOMEN:  Positive bowel sounds, soft, nontender to  palpation. EXTREMITIES:  Pedal pulses are intact.  He does have lower extremity edema.  Right lower extremity with 3+ pitting, left lower extremity was 1+. NEUROLOGIC:  His motor strength is 2- at the right hip flexor and knee extensor, 4 at the TA and gastroc, and 4/5 throughout.  In the left lower extremity, he has 5/5 bilateral deltoid, biceps, triceps and finger flexors.  Sensation is intact bilateral upper and lower extremity.  Mood, memory, affect, and judgment are all intact.  Deep tendon reflexes are normal, although difficult to elicit at bilateral knees due to scarring  from previous surgery.  POST ADMISSION PHYSICIAN EVALUATION: 1. Functional deficits secondary to right total hip replacement with     right lower extremity swelling resulting from DVT. 2. The patient was admitted to receive collaborative interdisciplinary     care between the podiatrist, rehab nursing staff and therapy team. 3. The patient's level of medical complexity and substantial therapy     needs in context of that medical necessity cannot be provided at a     lesser intensity of care. 4. The patient has experienced substantial functional loss from     baseline.  At the time of preadmission screening, the patient was     at a min assist bed mobility, min assist transfers, min-to-guard 22     feet with rolling walker, min assist upper body and max assist     lower body, ADLs.  Currently, the patient is at a supervision level     bed mobility, min assist transfers, min-to-guard rolling walker,     min assist upper body, mass assist lower body dressing and mid-to-     guard toilet transfers.  Judging by the patient's physical exam,     diagnosis and functional history, the patient has displayed the     ability to make functional progress which will result in measurable     gains while on inpatient rehab.  These gains will be of substantial     and practical use upon discharge to home in facilitating mobility,     self-care and independence.  Interim changes of medical status     since preadmission screening are detailed in the history of present     illness. 5. Physiatrist will provide 24-hour management and medical needs as     well as oversight of therapy plan/treatment and provide guidance     appropriate regarding interactions of the two. 6. A 24-hour rehab nursing will assess in management of skin, bowel,     bladder, and help to integrate therapy, techniques, education, etc. 7. PT will assess and treat for pre-gait training, gait training,     endurance, safety, edema  reduction.  Goals are for modified     independent level with all mobility. 8. OT will assess and treat for ADLs, safety endurance.  Goals are for     modified independent with all ADLs while maintaining safe hip     precautions. 9. Case management and social work will assess and treat for     psychosocial issues and discharge planning. 10.Team conference will be held weekly to assess the patient     progress/goals and to determine barriers to discharge. 11.The patient has demonstrated sufficient medical stability and     exercise capacity to tolerate at least 3 hours of therapy per day     at least 5 days per week. 12.Estimated length of stay is 5-7 days.  Prognosis for further  functional improvement is good.  MEDICAL PROBLEM LIST AND PLAN: 1. Deep venous thrombosis, on Coumadin for deep venous thrombosis     treatment. 2. Atrial fibrillation and flutter, normal sinus rhythm on amiodarone     and beta-blocker, rate control.  He is also on Coumadin for his     AFib. 3. Acute blood loss anemia.  Off aspirin per Cardiology. 4. Dyslipidemia.  Continue Crestor. 5. Benign prostatic hypertrophy.  Continue Flomax.  Monitor for     retention. 6. Lower extremity edema elevation.  Compression stocking. 7. Hypokalemia. 8. History of gastrointestinal, on PPI daily.  Motivation and mood appear to be adequate per Inpatient Rehab.  Rehab Medicine Services explained, questions were answered.     Erick Colace, M.D.   ______________________________ Ranelle Oyster, M.D.    AEK/MEDQ  D:  06/14/2010  T:  06/15/2010  Job:  [id number]  cc:   Reather Littler, M.D. Robert A. Thurston Hole, M.D. Petra Kuba, M.D. [facility], M.D. Cassell Clement, M.D.  Electronically Signed by Claudette Laws M.D. on 06/15/2010 04:29:20 PM Electronically Signed by Faith Rogue M.D. on 06/20/2010 09:57:15 AM

## 2010-06-18 ENCOUNTER — Inpatient Hospital Stay (HOSPITAL_COMMUNITY): Payer: Medicare Other

## 2010-06-18 LAB — GLUCOSE, CAPILLARY: Glucose-Capillary: 105 mg/dL — ABNORMAL HIGH (ref 70–99)

## 2010-06-18 LAB — PROTIME-INR: Prothrombin Time: 31 seconds — ABNORMAL HIGH (ref 11.6–15.2)

## 2010-06-19 LAB — CBC
MCH: 32.1 pg (ref 26.0–34.0)
MCHC: 31.6 g/dL (ref 30.0–36.0)
MCV: 101.7 fL — ABNORMAL HIGH (ref 78.0–100.0)
Platelets: 407 10*3/uL — ABNORMAL HIGH (ref 150–400)
RBC: 2.99 MIL/uL — ABNORMAL LOW (ref 4.22–5.81)

## 2010-06-19 LAB — COMPREHENSIVE METABOLIC PANEL
ALT: 47 U/L (ref 0–53)
Alkaline Phosphatase: 64 U/L (ref 39–117)
BUN: 9 mg/dL (ref 6–23)
CO2: 31 mEq/L (ref 19–32)
Calcium: 8.5 mg/dL (ref 8.4–10.5)
GFR calc non Af Amer: >60 mL/min (ref >60)
Glucose, Bld: 99 mg/dL (ref 70–99)
Potassium: 3.8 mEq/L (ref 3.5–5.1)
Sodium: 144 mEq/L (ref 135–145)

## 2010-06-19 NOTE — Discharge Summary (Addendum)
  NAME:  Connor [name], Connor [name] NO.:  [id number]  MEDICAL RECORD NO.:  [id number]           PATIENT TYPE:  I  LOCATION:  4039                         FACILITY:  [facility]  PHYSICIAN:  Tarry Kos, MD       DATE OF BIRTH:  [birth year]  DATE OF ADMISSION:  06/08/2010 DATE OF DISCHARGE:                              DISCHARGE SUMMARY   DISCHARGE DIAGNOSES: 1. Atrial flutter with rapid ventricular response, recurrent, status     post direct current cardioversion, currently rate controlled. 2. Newly diagnosed right lower extremity deep vein thrombosis. 3. Recent total hip replacement. 4. Chronic anticoagulation with Coumadin.  SUMMARY OF HOSPITAL COURSE:  Connor [name] is a pleasant 77 year old male who was admitted with Aflutter with RVR.  His rate was hard to control. Cardiology was consulted and did a cardioversion on him 2-3 days ago. He has been controlled since then with amiodarone and his beta-blocker. It has planned from him to follow up with Cardiology in 6 weeks after he has been therapeutic with an INR of 2-3 for 4 weeks for a possible ablation.  Also during his hospitalization, he was diagnosed with a new DVT.  His INR was subtherapeutic when he was admitted and on top of that, he recently had a total hip replacement.  He has been rate controlled for about 48 hours now.  PHYSICAL EXAMINATION:  VITAL SIGNS:  He has afebrile.  Vital signs have been stable. GENERAL:  He has been alert and oriented x4 in no apparent distress. CARDIAC:  Regular rate and rhythm without murmurs, rub, or gallops. CHEST:  Clear to auscultation bilaterally.  No wheezes or rales. ABDOMEN:  Soft, nontender, nondistended.  Positive bowel sounds.  No hepatomegaly. EXTREMITIES:  No clubbing, cyanosis, or edema. PSYCHIATRY:  Normal mood and affect. NEUROLOGIC:  No focal neurologic deficits. SKIN:  No rashes.  The patient has been discharged to inpatient rehab for aggressive physical therapy.   He will need a followup with primary care physician in 1 week.  He is being discharged on the following medications: 1. Coumadin treatment per pharmacy.  He received 7.5 mg of Coumadin     yesterday, 5 mg a day before. 2. Stop aspirin.  Please see discharge med rec sheet for full details of the rest of his medications.  He is to follow up with Cardiology in 4 weeks for ablation and his primary care physician in 1 week.          ______________________________ Tarry Kos, MD     RD/MEDQ  D:  06/14/2010  T:  06/15/2010  Job:  [id number]  Electronically Signed by Tarry Kos MD on 08/30/2010 06:26:45 AM

## 2010-06-20 DIAGNOSIS — M1610 Unilateral primary osteoarthritis, unspecified hip: Secondary | ICD-10-CM

## 2010-06-20 DIAGNOSIS — S68419A Complete traumatic amputation of unspecified hand at wrist level, initial encounter: Secondary | ICD-10-CM

## 2010-06-20 LAB — PROTIME-INR: Prothrombin Time: 27.7 seconds — ABNORMAL HIGH (ref 11.6–15.2)

## 2010-06-20 NOTE — Consult Note (Signed)
NAME:  Connor [name], Connor [name] NO.:  [id number]  MEDICAL RECORD NO.:  [id number]           PATIENT TYPE:  I  LOCATION:  2926                         FACILITY:  [facility]  PHYSICIAN:  Hillis Range, MD       DATE OF BIRTH:  1934/03/14  DATE OF CONSULTATION: DATE OF DISCHARGE:                                CONSULTATION   REQUESTING PHYSICIAN:  C. Duane Lope, MD  REASON FOR CONSULTATION:  Atrial flutter.  HISTORY OF PRESENT ILLNESS:  Mr. Barrows is a pleasant 77 year old gentleman with a history of recurrent typical-appearing atrial flutter, hypertension and recent right total hip replacement who is now admitted with symptomatic atrial flutter.  The patient appears to have had atrial flutter initially diagnosed in March 2006, for which, he required cardioversion.  He was treated initially with a low-dosed amiodarone and subsequently developed recurrent atrial flutter in December 2009.  He again required cardioversion.  Most recently, he had atrial flutter in August 2011, for which, he was again cardioverted and continued on amiodarone 100 mg daily.  He has chronically been treated with Coumadin. Most recently, he underwent right hip replacement by Dr. Thurston Hole on June 05, 2010.  He was discharged on June 07, 2010 and appears to have probably been in sinus rhythm at that time.  His Coumadin was interrupted for surgery and he was placed on Lovenox 30 mg twice daily. He reports that on June 08, 2010, he developed tachypalpitations with fatigue.  He was therefore brought to [facility] where he was found to have typical-appearing atrial flutter with ventricular rates in the 120s.  He was therefore admitted for further evaluation.  The patient also reports moderate pain and swelling along his right leg and flank.  He feels that this has been progressive over the past few days. He is otherwise without complaint.  PAST MEDICAL HISTORY: 1. Recurrent atrial  flutter. 2. Right bundle-branch block. 3. Hypertension. 4. Hyperlipidemia. 5. Status post right hip replacement on June 05, 2010. 6. BPH. 7. Peptic ulcer disease. 8. Anemia. 9. No prior history of ischemia with a preserved ejection fraction on     echo in December 2009.  MEDICATIONS:  Reviewed in the [facility]  ALLERGIES:  PENICILLIN.  SOCIAL HISTORY:  The patient lives in [place] with his spouse.  He has a history of tobacco, but quit 30 years.  He drinks socially.  FAMILY HISTORY:  Notable for coronary artery disease.  REVIEW OF SYSTEMS:  All systems were reviewed and negative except as outlined in the HPI above.  PHYSICAL EXAMINATION:  Telemetry reveals atrial flutter with 2:1 AV conduction and ventricular rates in the 110s to 120s. VITAL SIGNS:  Blood pressure 107/56, heart rate 120, respirations 17, sats 93% on 2 liters afebrile. GENERAL:  The patient is a well-appearing male in no acute distress.  He is alert and oriented x3. HEENT:  Normocephalic, atraumatic.  Sclerae clear.  Conjunctivae pale. Oropharynx clear. NECK:  Supple.  JVP 8 cm. LUNGS:  Clear. HEART:  Tachycardic, irregular rhythm.  No murmurs, rubs or gallops. GI:  Soft, nontender, nondistended.  Positive bowel  sounds. EXTREMITIES:  No clubbing or cyanosis.  He has 3+ pitting edema with moderate edema of his right hip and thigh, which extends down into the leg.  He has moderate ecchymosis as well along the right thigh and flank.  He has 2+ DP/PT pulses bilaterally. SKIN:  As above. MUSCULOSKELETAL:  As above. PSYCHIATRY:  Euthymic mood.  Full affect.  LABORATORY DATA:  Potassium 4, creatinine 0.6, INR 3.  Hematocrit 27.  EKG reveals typical-appearing atrial flutter with 2:1 AV conduction with a right bundle-branch block and a ventricular rate of 120 beats per minute.  Chest x-ray from June 08, 2010 reveals no acute airspace disease.  IMPRESSION:  Mr. Decandia is a very pleasant 77 year old  gentleman who now presents with symptomatic atrial flutter with 2:1 atrioventricular conduction.  He has a relatively soft blood pressure which has limited the ability to control heart rates.  The patient is presently resting comfortably and appears to be asymptomatic.  I think the most prominent issue at this time is the patient has a moderately enlarged right leg with significant edema and ecchymosis along the upper thigh and flank. This along with his hematocrit suggest to me that he has had significant postoperative bleeding within his right hip.  His INR is 3 at this time and he has recently been treated with Lovenox.  I think that before we proceed to cardioversion or ablation, we need to be assured that his right hip is stable and will not require significant interruption of his Coumadin.  I have spoken with Dr. Thurston Hole and also his assistant.  At this time, our plan is to shoot for an INR closer to 2 and observe the patient with serial hematocrits and physical exams.  If the patient's hematocrit in exam remained stable, then I think that we would be able to continue Coumadin for 4 weeks.  I therefore would plan on TEE-guided cardioversion later this week.  If, however, he has further bleeding and an interruption of his Coumadin would be required, then I think that we will have to rate control the patient until he can be placed on Coumadin for 4 weeks.  In the interim, I will continue amiodarone for rate control.  I do not feel that he will require more aggressive heart rate control as he is presently asymptomatic with heart rates in 110s. Once the bleeding and issues with his hip are completely resolved, he can return in the elective outpatient setting for catheter ablation of atrial flutter at that time, which we will arrange prior to discharge. I would anticipate that the patient would likely require inpatient rehab once plan has been established over the next few  days.     Hillis Range, MD     JA/MEDQ  D:  06/11/2010  T:  06/12/2010  Job:  [id number]  cc:   C. Duane Lope, M.D. Robert A. Thurston Hole, M.D.  Electronically Signed by Hillis Range MD on 06/20/2010 10:23:01 PM

## 2010-06-26 NOTE — Discharge Summary (Signed)
  NAME:  Connor [name], Connor [name] NO.:  [id number]  MEDICAL RECORD NO.:  [id number]           PATIENT TYPE:  I  LOCATION:  5018                         FACILITY:  [facility]  PHYSICIAN:  Rexine Gowens A. Thurston Hole, M.D. DATE OF BIRTH:  10/12/1933  DATE OF ADMISSION:  06/04/2010 DATE OF DISCHARGE:                              DISCHARGE SUMMARY   IT IS AN ADDENDUM TO GO WITH DISCHARGE SUMMARY [phone].  IT IS ONLY HIS DISCHARGE MEDICATIONS.  DISCHARGE MEDICATIONS: 1. Lovenox 30 mg one injection subcu q.12 h. until INR is 2.0 or     higher for 2 consecutive daily blood draws. 2. Oxycodone/APAP 5/325 one to two q.4 h. p.r.n. pain. 3. Amiodarone 200 mg 1 tablet daily. 4. Aspirin 81 mg 1 tablet daily. 5. Diltiazem CD 240 mg daily. 6. Flomax 0.4 mg 1 tablet daily. 7. Acai Vitamin 1 tablet daily. 8. Lipitor 10 mg 1 tablet daily. 9. Metoprolol 25 mg 1 tablet twice a day. 10.Multivitamin 1 tablet daily. 11.Omeprazole 20 mg 1 tablet daily. 12.Colace 100 mg 1 tablet twice a day. 13.Triamterene/hydrochlorothiazide 37.5/25 half a tablet every other     day, hold this medication if blood pressure is less than 120/80. 14.Vitamin C 500 mg daily. 15.Vitamin D 5000 units daily. 16.Coumadin 5 mg every day, except for Sundays, but adjust     appropriately to maintain his INR between 2.0 and 3.0 for     difficulty with his heart or cardiac arrhythmia.  Please call Dr.     Patty Sermons for orthopedic issues.  Please call Dr. Thurston Hole.  Dr.     Jonetta Osgood office number is [phone].     Kirstin Shepperson, P.A.   ______________________________ Elana Alm Thurston Hole, M.D.    KS/MEDQ  D:  06/07/2010  T:  06/07/2010  Job:  [id number]  Electronically Signed by Julien Girt P.A. on 06/12/2010 12:45:22 PM Electronically Signed by Salvatore Marvel M.D. on 06/26/2010 08:34:40 AM

## 2010-06-26 NOTE — Discharge Summary (Signed)
NAME:  Connor [name], Connor [name] NO.:  [id number]  MEDICAL RECORD NO.:  [id number]           PATIENT TYPE:  I  LOCATION:  5018                         FACILITY:  [facility]  PHYSICIAN:  Dorthie Santini A. Thurston Hole, M.D. DATE OF BIRTH:  1933-10-28  DATE OF ADMISSION:  06/04/2010 DATE OF DISCHARGE:  06/07/2010                              DISCHARGE SUMMARY   ADMITTING DIAGNOSES: 1. End-stage degenerative joint disease, right hip. 2. Benign prostatic hypertrophy with chronic urinary retention that     the patient self cath. 3. Hypertension. 4. Atrial fibrillation. 5. Gastroesophageal reflux disease. 6. History of gastrointestinal ulcer. 7. High cholesterol.  DISCHARGE DIAGNOSES: 1. End-stage degenerative joint disease, right hip. 2. Status post right total hip replacement. 3. Acute blood loss anemia, status post transfusion of 2 units of     packed red blood cells on June 07, 2010. 4. Hypertension, currently hypotensive.  This is being treated with     transfusion. 5. History of atrial fibrillation, currently normal sinus rhythm. 6. High cholesterol. 7. Gastroesophageal reflux disease with a history of a     gastrointestinal ulcer. 8. Benign prostatic hypertrophy with a history of the patient self     cathing 3 to 4 days a week with a 16 catheter to keep his ureters     open.  HISTORY OF PRESENT ILLNESS:  The patient is a 77 year old white male with a history of end-stage DJD of his right hip and avascular necrosis. He has failed conservative care including anti-inflammatories and interarticular cortisone injections.  Risks, benefits and possible complications of a right total hip were discussed in detail with the patient.  He understands these and is without question.  The procedures in-house on June 04, 2010, the patient underwent a right total hip replacement with a posterior approach by Dr. Thurston Hole.  He tolerated the procedure well.  He was admitted for pain control, DVT  prophylaxis and physical therapy.  Postop day #1, the patient was unable to get up the first time with physical therapy and the second time with physical therapy ambulated 6 feet.  His catheter was left in due to his many difficulties with his prostate and urinary retention.  He did have some nausea and vomiting and he was started on Reglan 10 mg IV q. 8 for 48 hours.  He was given a 500 mL bolus twice during postop day #1 and was given milk of magnesia to help with constipation.  Postop day #2, the patient's hemoglobin was down to 8.9.  He still continued to struggle with dizziness with ambulation, max ambulation, bed to chair transfers with significant dizziness.  Postop day #3, the patient continued to be dizzy.  His hemoglobin was down to 8.1.  He was transfused 2 units of packed red blood cells and is being transferred to skilled nursing, 50% weightbearing.    He will need physical therapy daily, occupational therapy daily.  He is on posterior total hip precautions.  He will need H and H done on June 08, 2010 to check the results of his transfusion that is being done today.  At this point  in time, the patient may need catheters to self cath himself if he has difficulty with urinary retention.  He states at home, he uses 16. He will need to follow up with Dr. Thurston Hole on June 18, 2010 for suture removal and x-rays.  Please call our office with increased pain, increased redness about the wound, increased swelling, increased drainage or a temp greater than 101.  Office number is [phone].  IT IS AN ADDENDUM TO GO WITH DISCHARGE SUMMARY #[id number].  IT IS ONLY HIS DISCHARGE MEDICATIONS.  DISCHARGE MEDICATIONS: 1. Lovenox 30 mg one injection subcu q.12 h. until INR is 2.0 or     higher for 2 consecutive daily blood draws. 2. Oxycodone/APAP 5/325 one to two q.4 h. p.r.n. pain. 3. Amiodarone 200 mg 1 tablet daily. 4. Aspirin 81 mg 1 tablet daily. 5. Diltiazem CD 240 mg  daily. 6. Flomax 0.4 mg 1 tablet daily. 7. Acai Vitamin 1 tablet daily. 8. Lipitor 10 mg 1 tablet daily. 9. Metoprolol 25 mg 1 tablet twice a day. 10.Multivitamin 1 tablet daily. 11.Omeprazole 20 mg 1 tablet daily. 12.Colace 100 mg 1 tablet twice a day. 13.Triamterene/hydrochlorothiazide 37.5/25 half a tablet every other     day, hold this medication if blood pressure is less than 120/80. 14.Vitamin C 500 mg daily. 15.Vitamin D 5000 units daily. 16.Coumadin 5 mg every day, except for Sundays, but adjust     appropriately to maintain his INR between 2.0 and 3.0 for     difficulty with his heart or cardiac arrhythmia.  Please call Dr.     Patty Sermons for orthopedic issues.  Please call Dr. Thurston Hole.  Dr.     Jonetta Osgood office number is [phone].     Kirstin Shepperson, P.A.   ______________________________ Elana Alm Thurston Hole, M.D.    KS/MEDQ  D:  06/07/2010  T:  06/07/2010  Job:  [id number]  Electronically Signed by Julien Girt P.A. on 06/12/2010 12:45:15 PM Electronically Signed by Salvatore Marvel M.D. on 06/26/2010 08:34:30 AM

## 2010-06-27 NOTE — Consult Note (Signed)
NAME:  Connor [name], Connor [name] NO.:  [id number]  MEDICAL RECORD NO.:  [id number]           PATIENT TYPE:  I  LOCATION:  2926                         FACILITY:  [facility]  PHYSICIAN:  Bevelyn Buckles. Daylyn Azbill, MDDATE OF BIRTH:  [birth year]  DATE OF CONSULTATION:  06/10/2010 DATE OF DISCHARGE:                                CONSULTATION   PRIMARY CARDIOLOGIST:  Cassell Clement, MD.  CONSULTING PHYSICIAN:  Hind I Eda Paschal, MD of the [facility].  REASON FOR CONSULTATION:  Recurrent atrial flutter with rapid ventricular response and hypotension.  HISTORY OF PRESENT ILLNESS:  [name] is a very pleasant 77 year old male with a history of hypertension, hyperlipidemia, and right bundle- branch block.  There is no known history of coronary artery disease.  He had a Myoview in 2005, which showed normal ejection fraction and no evidence of ischemia.  According to the notes, he first developed atrial flutter in March 2006. This was a brief episode.  He then, however, had a recurrence several days later and underwent direct current cardioversion.  He was started on low-dose amiodarone at that time.  He had a recurrent episode of atrial flutter in December 2009.  His amiodarone was increased briefly and he underwent repeat cardioversion on May 20, 2008.  He again had a recurrence of atrial flutter in August 2011, at which time he underwent direct current cardioversion.  Amiodarone was continued at 100 mg per day.  On June 05, 2010, he underwent a right hip replacement by Dr. Thurston Hole. He was discharged on June 07, 2010.  He was in rehab on June 08, 2010, when he became weak and presyncopal.  He came to the emergency room and was found to have atrial flutter with rapid ventricular response with a rate of 125.  His systolic blood pressures have been borderline.  While in the hospital, he has been continued with atrial flutter with rapid ventricular response  and has not been able to receive rate control agents due to his borderline blood pressure.  He is not complaining of any chest pain or dyspnea.  REVIEW OF SYSTEMS:  He does have pain in his right hip and other joints due to his arthritis and recent surgery.  He denies any fevers or chills.  No nausea or vomiting.  No frank syncope.  No bleeding. Remainder of review of systems, all systems are negative except for HPI and problem list.  PROBLEM LIST: 1. Recurrent atrial flutter as described above. 2. Right bundle-branch block. 3. Hypertension. 4. Hyperlipidemia. 5. Degenerative joint disease.     a.     Status post right hip replacement on June 05, 2010. 6. History of peptic ulcer disease. 7. Benign prostatic hypertrophy with history of chronic urinary     retention. 8. Anemia. 9. No history of coronary artery disease.     a.     Myoview in 2005 showed normal ejection fraction with no      evidence of ischemia.     b.     Echocardiogram of December 2009 showed an EF of 50-55% with      no  significant wall motion abnormalities.  CURRENT MEDICATIONS: 1. He is on amiodarone 200 mg a day. 2. Vitamin C. 3. Aspirin 81 a day. 4. Coumadin. 5. Diltiazem 30 mg q.6. 6. Lopressor 25 mg p.o. b.i.d. 7. Protonix 40 mg a day. 8. Potassium 20 mEq 4 times a day. 9. Crestor 5 mg a day. 10.Flomax 0.4 a day.  He is getting IV fluids.  ALLERGIES:  PENICILLIN.  SOCIAL HISTORY:  He is married.  Lives with his wife.  Has a history of tobacco, but quit 30 years ago.  Drinks socially.  FAMILY HISTORY:  Father had a series of heart attacks beginning this late 71s and died at age 10 due to heart attack and a brother, who had a history of atrial fibrillation and status post ablation.  PHYSICAL EXAM:  GENERAL:  He is fatigued appearing, in no acute distress. VITAL SIGNS:  Respirations are unlabored.  Blood pressure currently 108/65.  His heart rate is ranging from 105-125.  He is satting 94%  on room air.  He is afebrile. HEENT:  Normal. NECK:  Supple.  No obvious JVD.  Carotids are 2+ bilaterally without bruits.  There is no lymphadenopathy or thyromegaly. CARDIAC:  PMI is not displaced.  He is regular and tachycardic.  No obvious murmurs. LUNGS:  Clear. ABDOMEN:  Soft, nontender, nondistended.  No hepatosplenomegaly.  No bruits.  No mass. EXTREMITIES:  Warm.  No cyanosis or clubbing.  There is no swelling on the left.  On the right leg, he is status post recent hip replacement and has 1-2+ edema.  I do not feel cord. NEURO:  Alert and oriented x3.  Cranial nerves II-XII are intact.  Moves all 4 extremities without difficulty.  Affect is pleasant.  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 Magnesium  is 2.2.  White count 7.2, hemoglobin 10.1, platelet 237. Sodium 140, potassium 3.3, BUN 8, creatinine 0.71.  Troponin is 0.04. INR is 3.03.  ASSESSMENT: 1. Recurrent atrial flutter with rapid ventricular response,     symptomatic. 2. Hypotension. 3. Recent right hip replacement. 4. Hypokalemia.  PLAN/DISCUSSION:  Given his hypotension and rapid risk of ventricular response, we will transfer to Step-Down.  We will start him on IV amiodarone for rate control.  We will treat him with IV fluid to support his blood pressure. We will check a 2-D echo.  If his RV is significantly abnormal, we will need CT scan to rule out pulmonary embolus.  We will stop his potassium. I will discuss with EP regarding possible ablation.  We appreciate the consult.  We will follow with you.     Bevelyn Buckles. Barbie Croston, MD     DRB/MEDQ  D:  06/10/2010  T:  06/10/2010  Job:  [id number]  Electronically Signed by Arvilla Meres MD on 06/27/2010 01:57:19 PM

## 2010-06-29 NOTE — Procedures (Signed)
  NAME:  Connor [name], Connor [name] NO.:  [id number]  MEDICAL RECORD NO.:  [id number]           PATIENT TYPE:  I  LOCATION:  2033                         FACILITY:  [facility]  PHYSICIAN:  Marca Ancona, MD      DATE OF BIRTH:  [birth year]  DATE OF PROCEDURE:  06/13/2010 DATE OF DISCHARGE:                           CARDIAC CATHETERIZATION   PROCEDURE NOTE:  The patient underwent informed consent for a TEE cardioversion.  PROCEDURE:  After TEE ruled out left atrial appendage thrombus, the patient was sedated by Anesthesiology using 110 mg of IV propofol.  The patient was then cardioverted to normal sinus rhythm using 150-joule biphasic waveform.  Pads were anterior and posterior.  The patient's INR of note was 2.1 on the day of the procedure and there were no complications.     Marca Ancona, MD     DM/MEDQ  D:  06/13/2010  T:  06/14/2010  Job:  [id number]  cc:   Cassell Clement, M.D. Hillis Range, MD  Electronically Signed by Marca Ancona MD on 06/29/2010 10:36:31 AM

## 2010-07-02 ENCOUNTER — Ambulatory Visit (INDEPENDENT_AMBULATORY_CARE_PROVIDER_SITE_OTHER): Payer: Medicare Other | Admitting: Cardiology

## 2010-07-02 DIAGNOSIS — Z7901 Long term (current) use of anticoagulants: Secondary | ICD-10-CM

## 2010-07-02 DIAGNOSIS — I4892 Unspecified atrial flutter: Secondary | ICD-10-CM

## 2010-07-02 DIAGNOSIS — Z79899 Other long term (current) drug therapy: Secondary | ICD-10-CM

## 2010-07-02 DIAGNOSIS — I119 Hypertensive heart disease without heart failure: Secondary | ICD-10-CM

## 2010-07-12 NOTE — H&P (Signed)
NAME:  Connor [name], Connor [name] NO.:  [id number]  MEDICAL RECORD NO.:  [id number]           PATIENT TYPE:  E  LOCATION:  [facility]                         FACILITY:  [facility]  PHYSICIAN:  Emeline General, MD    DATE OF BIRTH:  June 16, 1933  DATE OF ADMISSION:  06/08/2010 DATE OF DISCHARGE:                             HISTORY & PHYSICAL   PRIMARY CARE PHYSICIAN:  Reather Littler, MD  CARDIOLOGIST:  Cassell Clement, MD  ORTHOPEDIC SURGEON:  Elana Alm. Thurston Hole, MD  CHIEF COMPLAINT:  Palpitation/irregular heart beat.  HISTORY OF PRESENT ILLNESS:  Connor [name] is a 77 year old man, who was discharged from the hospital yesterday after he underwent total right hip replacement by Dr. Thurston Hole, and he was subsequently sent to skilled nursing facility for rehabilitation.  As per the patient, he had an episode today of dizziness, palpitation associated with nausea and generalized weakness, and he was found to be tachycardic with heart rate in the 119 and was sent to the ER for further evaluation.  Noted that the patient had history of chronic AFib/atrial flutter as per him, he had cardioversion twice in the past.  Last cardioversion was done in August 2011.  As per the patient, he has been experiencing fluctuations in heart rate over the last month and he called Dr. Patty Sermons office and his Toprol dose was increased; however, he was seen by Dr. Patty Sermons in the office about 2 weeks ago for preop evaluation and no change in his medication were done and everything was under control as per him at that time.  The patient currently denies any dizziness, chest pain, or shortness of breath at this time.  He was seen by the ED physician. Initially, his heart rate was fluctuating around 110 and 112, and he was given a bolus of fluid and Cardizem IV bolus of 10 mg and observed in the ED with some improvement in his heart rate.  However, his heart rate increased again while he is still in the ER and  they gave him p.o. mets. The plan was to discharge him back to the nursing home facility if his heart stabilize.  However while he is still in the ER, his blood pressure dropped to a systolic of 90s to 100s and heart rate came up again to 112-117, and it was felt that he needed to come into the hospital for observation and further management.  PAST MEDICAL HISTORY:  Significant for; 1. Atrial fibrillation/Aflutter status post cardioversion x2 followed     by Dr. Patty Sermons. 2. End-stage degenerative joint disease of the right hip status post     right total hip replacement by Dr. Thurston Hole on June 05, 2010,     discharged from the hospital yesterday. 3. Benign prostatic hypertrophy with history of chronic urinary     retention that the patient self catheterize. 4. Hypertension. 5. Gastroesophageal reflux disease. 6. History of gastrointestinal ulcer. 7. Hyperlipidemia. 8. Anemia.  ALLERGIES:  He is allergic to PENICILLINS.  HOME MEDICATIONS: 1. Lovenox 30 mg q.12 h. subcu. 2. Coumadin 5 mg daily except for Sundays in which he does not take  any. 3. Vitamin D3 5000 units p.o. 1 capsule daily. 4. Vitamin C 500 mg p.o. daily. 5. Triamterene/hydrochlorothiazide 37.5/25 mg, takes 0.5 mg every     other day. 6. Stool softener over-the-counter 1 cap daily. 7. Omeprazole 20 mg p.o. daily. 8. Multivitamin 1 tablet p.o. daily. 9. Metoprolol tartrate 25 mg 1 tablet p.o. twice daily. 10.Lipitor 10 mg 1 tab daily. 11.Flomax 0.4 mg 1 cap daily. 12.Diltiazem CD/XT 240 mg 1 cap daily. 13.Celebrex 200 mg 1 cap every morning. 14.Aspirin 81 mg p.o. daily. 15.Amiodarone 200 mg q.a.m.  SOCIAL HISTORY:  He is married, lives with his wife.  Currently, lives with SNF for rehab.  Smokes in the past, quit about 30 years ago. Drinks alcohol occasionally.  No illicit drug use.  FAMILY HISTORY:  Denies family history of cardiac disease.  REVIEW OF SYSTEMS:  CHEST:  No shortness of breath.  No  cough. CARDIOVASCULAR:  Significant for palpitations and dizziness as above. No chest pain. ABDOMEN:  No nausea.  No vomiting.  No change in his bowel habit. GENITOURINARY:  Self-catheterize for urinary retention on and off.  No dysuria or flank pain or suprapubic pain. CONSTITUTIONAL:  No fever or chills. JOINTS:  He is complaining of pain at the surgical sites with movement.  PHYSICAL EXAMINATION:  VITAL SIGNS:  Systolic blood pressure fluctuates between 90s-102, pulse fluctuates between 100 and 117, temperature 98.2, and O2 sat 99% on oxygen. GENERAL:  He is alert, not in acute distress. NECK:  Supple.  No JVD. LUNGS:  Clear to auscultation bilaterally. CARDIOVASCULAR:  S1 and S2.  Regular rhythm and rate. ABDOMEN:  Obese, soft, and nontender. EXTREMITIES:  His right lower extremity is status post total right hip replacement, dressing in place, and noted that his right lower extremity is swollen.  There is no erythema noted and pulse are palpable.  Left lower extremity with no edema.  LABORATORY DATA:  Potassium 3.2, CO2 of 30, glucose 134, BUN 9, creatinine 0.7, calcium 8.1, and PT is 1.8.  Cardiac markers, troponin is less than 0.05.  Hemoglobin 9.8, hematocrit 29.4, platelets 155, and WBC 7.8.  RADIOLOGY/IMAGING:  Chest x-ray showed no active disease.  EKG showed A flutter with RVR rate of 116.  ASSESSMENT AND PLAN: 1. Atrial fibrillation/atrial flutter with RVR.  The patient will be     admitted to the telemetry floor. 2. Unfortunately given his hypotension, I do not have any room to     increase any of his rate control agents including Cardizem and     metoprolol. 3. I will gently hydrate him and make sure his pain is adequately     controlled.  However, avoid narcotics at this time given his     hypotension. 4. Continue with Lovenox full dose and Coumadin. 5. Given his hypotension and tachycardia, we will consult [facility]. 6. End-stage right hip  degenerative joint disease status post right     total hip replacement.  Plan as per orthopedics.  He was supposed     to follow with Dr. Thurston Hole as an outpatient. 7. Swollen right lower extremity.  I will order for right lower     extremity Duplex and check his D-dimer.  The patient is already     covered with Coumadin and Lovenox. 8. Hypertension.  The patient is currently hypotensive as above. 9. History of gastroesophageal reflux disease/gastrointestinal ulcer.     We will place the patient on PPI. 10.DVT prophylaxis.  The  patient is already on Coumadin. 11.Code status.  The patient is full code. 12.History of benign prostatic hypertrophy with chronic urinary     retention.  Continue self cath.    ______________________________ Baltazar Najjar, MD   ______________________________ Emeline General, MD    SA/MEDQ  D:  06/08/2010  T:  06/08/2010  Job:  [id number]  cc:   Reather Littler, M.D. Cassell Clement, M.D.  Electronically Signed by Hannah Beat MD on 07/11/2010 09:24:26 PM

## 2010-07-16 ENCOUNTER — Encounter (INDEPENDENT_AMBULATORY_CARE_PROVIDER_SITE_OTHER): Payer: Medicare Other

## 2010-07-16 DIAGNOSIS — Z7901 Long term (current) use of anticoagulants: Secondary | ICD-10-CM

## 2010-07-16 DIAGNOSIS — I4891 Unspecified atrial fibrillation: Secondary | ICD-10-CM

## 2010-07-18 NOTE — Discharge Summary (Signed)
NAME:  Connor [name], Connor [name] NO.:  [id number]  MEDICAL RECORD NO.:  [id number]           PATIENT TYPE:  I  LOCATION:  4039                         FACILITY:  [facility]  PHYSICIAN:  Ranelle Oyster, M.D.DATE OF BIRTH:  November 25, 1933  DATE OF ADMISSION:  06/14/2010 DATE OF DISCHARGE:  06/20/2010                              DISCHARGE SUMMARY   DISCHARGE DIAGNOSES: 1. 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 2. Atrial flutter, atrial fibrillation status post cardioversion. 3. Acute blood loss anemia. 4. Dyslipidemia. 5. Right lower extremity deep venous thrombosis. 6. Hypokalemia, resolved. 7. History of gastrointestinal bleed. 8. Benign prostatic hypertrophy history.  HISTORY OF PRESENT ILLNESS:  Connor [name] is a 77 year old male with history of hypertension, A flutter, DJD right hip with right total hip replacement on June 05, 2010.  The patient was discharged to SNF on June 07, 2010, but readmitted on June 08, 2010, with palpitations due to A flutter.  He was treated with amiodarone with hypotension initially.  [facility] was consulted for input and recurrent AFib, A flutter.  He required TEE and cardioversion on June 13, 2010, by Dr. Shirlee Latch.  The patient also with reports of right lower extremity edema at admission, right lower extremity Dopplers were positive for thrombus in right popliteal to posterior tibial vein.  The patient was started on IV heparin and continues on Coumadin currently.  CTA chest done shows no evidence of PE and bilateral lower extremity atelectasis with mild distal esophageal thickening, suspicious for esophagitis.  The patient has had issues with abnormal LFTs.  An abdominal ultrasound done showed cholelithiasis without cholecystitis.  The patient with acute blood loss anemia with H and H drop to 8.7 and 26.9 and Cards recommends discontinuing aspirin for now.   They will follow up with the patient in 6 weeks.  Therapies are ongoing currently.  The patient with antalgic gait with decrease in endurance and has been able to maintain partial weightbearing status in the right lower extremity.  The patient was evaluated by rehab and felt that he would benefit from a CIIR program.  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  ALLERGIES:  PENICILLIN.  REVIEW OF SYSTEMS:  Positive for weakness as well as pain and swelling in right lower extremity.  FAMILY HISTORY:  Positive for coronary artery disease and AFib.  SOCIAL HISTORY:  The patient is married.  He is retired from [employer].  He quit tobacco 40 years ago.  He has a glass of wine at nights.  FUNCTIONAL HISTORY:  The patient was independent and driving prior to his hip surgery.  FUNCTIONAL STATUS:  The patient is min guard assist transfers, min assist ambulating 70 feet with rolling walker, min assist upper body care, max assist lower body care.  PHYSICAL EXAMINATION:  VITAL  SIGNS:  Blood pressure 118/66, pulse 83, respirations 18, temperature 97.4. GENERAL:  The patient is a well-nourished, well-developed male, in no acute distress currently. HEENT:  Eyes anicteric, noninjected.  Oral mucosa is pink and moist with good dentition.  Hearing decreased. NECK:  Supple without JVD or lymphadenopathy. LUNGS:  Clear to auscultation bilaterally without wheezes, rales, or rhonchi. HEART:  Regular rate and rhythm without murmurs or gallops. ABDOMEN:  Soft, nontender with positive bowel sounds. EXTREMITIES:  Bilateral lower extremity edema with 3+ pitting on right lower extremity and 1+ pitting on left lower  extremity.  Right hip incision is clean, dry, intact, healing well. NEUROLOGIC:  Cranial nerves notable for decreased hearing.  Motor strength is 2- at right hip flexures and knee extensors, 4/5 at TA and gastroc, left lower extremity is 4/5 throughout.  Bilateral upper extremities 5/5 at deltoids, biceps, triceps, and finger flexures. Sensation intact in bilateral upper and lower extremities.  Mood, memory, affect, and judgment are intact.  DTRs normal although difficult to elicit at bilateral knees due to scarring from prior surgery.  HOSPITAL COURSE:  Connor [name] was admitted to rehab on June 14, 2010, for inpatient therapies to consist of PT, OT at least 3 hours 5 days a week.  Past admission physiatrist rehab RN and therapy team have worked together to provide customized collaborative interdisciplinary care.  Rehab RN has worked with the patient on bowel and bladder program as well as pain management issues.  The patient was maintained on Coumadin throughout his stay with INR goal of 2-3 range.  Due to history of urinary retention, PVR checks were checked x3 with the patient trained bladder volumes at 40-210.  The patient felt like he was voiding without any hesitancy or difficulty.  He was maintained on Flomax 0.4 mg p.o. daily.  Followup labs done past admission showed abnormal LFTs with AST at 58, ALT at 88, T bili at 1.5.  CBC checked at admission showed H and H at 8.7 and 26.8.  Check of lytes revealed sodium 140, potassium 4.0, chloride 102, CO2 is 31, BUN 8, creatinine 0.74, glucose 107. Recheck H and H prior to discharge shows improvement with hemoglobin at 9.6, hematocrit 30.4, white count 6.4, platelets 407.  Abnormal LFTs have resolved with AST at 24, ALT at 47, T bili at 1.2, a phos of 64, albumin at 2.3.  Followup hip x-rays done per Ortho input shows no complications.  The patient's heart rate has been controlled, and he remains in normal sinus  rhythm.  During the patient's stay in rehab, weekly team conferences were held to monitor the patient's progress, set goals, as well as discuss barriers to discharge.  At admission, the patient was noted to be limited by decreased endurance, decrease in functional mobility, as well as his total hip precautions and partial weightbearing status.  The patient was at min assist overall for transfers and mobility.  He has currently progressed to being at modified independent level for transfers, modified independent level for ambulating household distances with use of rolling walker.  He requires min assist to navigate stairs without rails.  Family education was done with wife and she is able to perform min assist to navigate stairs safely.  OT has worked with the patient on ADL tasks.  Initially, the patient was supervision steady assist to complete bathing and dressing tasks.  Currently, the patient is able to perform bathing and dressing at modified independent level.  He is able to use  Adaptic equipment for lower body care.  The patient is maintaining his hip precaution and showing good safety awareness during self-care tasks.  Wife is to provide assistance as needed past discharge.  Further followup home health, physical therapy to continue past discharge.  Home health RN arranged for pro time draws with next pro time checked on thursday, June 21, 2010, with results to [facility] at [facility].  On June 20, 2010, the patient is discharged to home.  DISCHARGE MEDICATIONS: 1. Flomax 0.4 mg p.o. per day. 2. Lopressor 25 mg b.i.d. 3. Multivitamin one per day. 4. Amiodarone 200 mg p.o. per day. 5. Vitamin C 500 mg a day. 6. Omeprazole 20 mg a day. 7. Vitamin D 5000 units per day. 8. Ocuvite one p.o. per day. 9. Senokot-S two p.o. b.i.d. 10.Colace 100 mg p.o. b.i.d. 11.MiraLax 17 g in 8 ounces p.o. per day. 12.Lipitor 10 mg p.o. nightly. 13.Carafate 1 g p.o. t.i.d.  a.c. 14.Coumadin 5 mg p.o. per day. 15.OxyIR 5-10 mg p.o. q.4-6 hours p.r.n. pain #60 prescribed. 16.Robaxin 500 mg one p.o. q.i.d. p.r.n. muscle spasms.  DIET:  Regular.  ACTIVITY LEVEL:  Intermittent supervision.  Use walker for ambulation, partial weightbearing on right lower extremity with the right total hip precautions.  FOLLOWUPS:  The patient is to follow up with Dr. Patty Sermons and Dr. Shirlee Latch in the next few weeks.  Follow up with Dr. Thurston Hole for postop check in 2 weeks.  Follow up with Dr. Lucianne Muss for routine check in 2 weeks.  Follow up with Dr. Riley Kill as needed.     Delle Reining, P.A.   ______________________________ Ranelle Oyster, M.D.    PL/MEDQ  D:  06/19/2010  T:  06/20/2010  Job:  [id number]  cc:   Reather Littler, M.D. Marca Ancona, MD Elana Alm. Thurston Hole, M.D.  Electronically Signed by Osvaldo Shipper. on 06/20/2010 04:25:56 PM Electronically Signed by Faith Rogue M.D. on 07/18/2010 10:20:20 AM

## 2010-07-22 LAB — BASIC METABOLIC PANEL
BUN: 17 mg/dL (ref 6–23)
Calcium: 8.5 mg/dL (ref 8.4–10.5)
GFR calc non Af Amer: >60 mL/min (ref >60)
Glucose, Bld: 86 mg/dL (ref 70–99)

## 2010-07-27 ENCOUNTER — Encounter: Payer: Self-pay | Admitting: Cardiology

## 2010-08-01 ENCOUNTER — Other Ambulatory Visit (INDEPENDENT_AMBULATORY_CARE_PROVIDER_SITE_OTHER): Payer: Medicare Other | Admitting: *Deleted

## 2010-08-01 DIAGNOSIS — I4891 Unspecified atrial fibrillation: Secondary | ICD-10-CM

## 2010-08-01 DIAGNOSIS — Z7901 Long term (current) use of anticoagulants: Secondary | ICD-10-CM

## 2010-08-02 ENCOUNTER — Encounter: Payer: Self-pay | Admitting: Cardiology

## 2010-08-02 ENCOUNTER — Ambulatory Visit (INDEPENDENT_AMBULATORY_CARE_PROVIDER_SITE_OTHER): Payer: Medicare Other | Admitting: Cardiology

## 2010-08-02 VITALS — BP 124/80 | HR 72 | Wt 209.0 lb

## 2010-08-02 DIAGNOSIS — I119 Hypertensive heart disease without heart failure: Secondary | ICD-10-CM

## 2010-08-02 DIAGNOSIS — I4891 Unspecified atrial fibrillation: Secondary | ICD-10-CM

## 2010-08-02 DIAGNOSIS — K219 Gastro-esophageal reflux disease without esophagitis: Secondary | ICD-10-CM

## 2010-08-02 DIAGNOSIS — E785 Hyperlipidemia, unspecified: Secondary | ICD-10-CM | POA: Insufficient documentation

## 2010-08-02 DIAGNOSIS — M1990 Unspecified osteoarthritis, unspecified site: Secondary | ICD-10-CM

## 2010-08-02 DIAGNOSIS — N400 Enlarged prostate without lower urinary tract symptoms: Secondary | ICD-10-CM | POA: Insufficient documentation

## 2010-08-02 DIAGNOSIS — I10 Essential (primary) hypertension: Secondary | ICD-10-CM

## 2010-08-02 NOTE — Progress Notes (Signed)
HPI:   Current Outpatient Prescriptions  Medication Sig Dispense Refill  . amiodarone (PACERONE) 200 MG tablet Take 200 mg by mouth daily.        . Ascorbic Acid (VITAMIN C PO) Take 1 tablet by mouth daily.        Marland Kitchen atorvastatin (LIPITOR) 10 MG tablet Take 10 mg by mouth daily.        . Cholecalciferol (VITAMIN D3) 5000 UNITS TABS Take 5,000 Units by mouth daily.        Marland Kitchen diltiazem (CARDIZEM CD) 240 MG 24 hr capsule Take 240 mg by mouth daily.        [name] Calcium (STOOL SOFTENER PO) Take by mouth as needed.        . metoprolol tartrate (LOPRESSOR) 25 MG tablet Take 25 mg by mouth 2 (two) times daily.        . Multiple Vitamin (MULTIVITAMIN) tablet Take 1 tablet by mouth daily.        . Multiple Vitamins-Minerals (ICAPS PO) Take by mouth daily.        Marland Kitchen omeprazole (PRILOSEC) 20 MG capsule Take 20 mg by mouth daily.        . Tamsulosin HCl (FLOMAX) 0.4 MG CAPS Take 0.4 mg by mouth daily.        Marland Kitchen warfarin (COUMADIN) 5 MG tablet Take 5 mg by mouth as directed. 5 MG x 6 DAYS A WEEK, NO COUMADIN 1 DAY A WEEK         Allergies  Allergen Reactions  . Penicillins     Patient Active Problem List  Diagnoses  . Atrial fibrillation    History  Smoking status  . Former Smoker  . Types: Cigarettes  . Quit date: 05/06/1978  Smokeless tobacco  . Not on file    History  Alcohol Use     Family History  Problem Relation Age of Onset  . Heart attack Father   . Lung disease Mother   . Atrial fibrillation Brother     Review of Systems: The patient denies any heat or cold intolerance.  No weight gain or weight loss.  The patient denies headaches or blurry vision.  There is no cough or sputum production.  The patient denies dizziness.  There is no hematuria or hematochezia.  The patient denies any muscle aches or arthritis.  The patient denies any rash.  The patient denies frequent falling or instability.  There is no history of depression or anxiety.  All other systems were reviewed  and are negative.   Physical Exam: Filed Vitals:   08/02/10 1223  BP: 124/80  Pulse: 72      Assessment / Plan:

## 2010-08-02 NOTE — Assessment & Plan Note (Signed)
Patient has had no new urinary tract symptoms and he remains on Flomax for his BPH outlet obstruction

## 2010-08-02 NOTE — Assessment & Plan Note (Signed)
His blood pressure is satisfactory the present time.  He will continue present medication

## 2010-08-02 NOTE — Assessment & Plan Note (Signed)
The patient is doing well following his right Total hip replacement.  He is no longer having to take Celebrex.

## 2010-08-02 NOTE — Assessment & Plan Note (Signed)
The patient has had no further atrial fibrillation.  However he would like to have a consultation regarding possible ablation since he has a history of recurrent atrial fibrillation and atrial flutter in the past.  We will continue him on his same medication including Coumadin metoprolol and amiodarone and we will refer her for EP consultation with Dr. Johney Frame.

## 2010-08-02 NOTE — Progress Notes (Signed)
History of Present Illness: This pleasant 77 year old gentleman is seen back for a followup office visit.  He was recently hospitalized at [facility].  He underwent right hip replacement on 06/04/10.  Postoperatively he went into atrial flutter fibrillation which prolonged his hospital stay.  He underwent elective cardioversion by Dr. Johney Frame.  The possibility of an ablation procedure was discussed for the future.  The patient is interested in pursuing that idea since he has had at least 3 direct current cardioversions for his atrial fibrillation and His atrial flutter.Since last visit the patient has remained in normal sinus rhythm clinically and he has not been aware of any palpitations.  Current Outpatient Prescriptions  Medication Sig Dispense Refill  . amiodarone (PACERONE) 200 MG tablet Take 200 mg by mouth daily.        . Ascorbic Acid (VITAMIN C PO) Take 1 tablet by mouth daily.        Marland Kitchen atorvastatin (LIPITOR) 10 MG tablet Take 10 mg by mouth daily.        . Cholecalciferol (VITAMIN D3) 5000 UNITS TABS Take 5,000 Units by mouth daily.        Marland Kitchen diltiazem (CARDIZEM CD) 240 MG 24 hr capsule Take 240 mg by mouth daily.        [name] Calcium (STOOL SOFTENER PO) Take by mouth as needed.        . metoprolol tartrate (LOPRESSOR) 25 MG tablet Take 25 mg by mouth 2 (two) times daily.        . Multiple Vitamin (MULTIVITAMIN) tablet Take 1 tablet by mouth daily.        . Multiple Vitamins-Minerals (ICAPS PO) Take by mouth daily.        Marland Kitchen omeprazole (PRILOSEC) 20 MG capsule Take 20 mg by mouth daily.        . Tamsulosin HCl (FLOMAX) 0.4 MG CAPS Take 0.4 mg by mouth daily.        Marland Kitchen warfarin (COUMADIN) 5 MG tablet Take 5 mg by mouth as directed. 5 MG x 6 DAYS A WEEK, NO COUMADIN 1 DAY A WEEK         Allergies  Allergen Reactions  . Penicillins     Patient Active Problem List  Diagnoses  . Atrial fibrillation    History  Smoking status  . Former Smoker  . Types: Cigarettes  .  Quit date: 05/06/1978  Smokeless tobacco  . Not on file    History  Alcohol Use     Family History  Problem Relation Age of Onset  . Heart attack Father   . Lung disease Mother   . Atrial fibrillation Brother     Review of Systems: Constitutional: no fever chills diaphoresis or fatigue or change in weight.  Head and neck: no hearing loss, no epistaxis, no photophobia or visual disturbance. Respiratory: No cough, shortness of breath or wheezing. Cardiovascular: No chest pain peripheral edema, palpitations. Gastrointestinal: No abdominal distention, no abdominal pain, no change in bowel habits hematochezia or melena. Genitourinary: No dysuria, no frequency, no urgency, no nocturia. Musculoskeletal:No arthralgias, no back pain, no gait disturbance or myalgias. Neurological: No dizziness, no headaches, no numbness, no seizures, no syncope, no weakness, no tremors. Hematologic: No lymphadenopathy, no easy bruising. Psychiatric: No confusion, no hallucinations, no sleep disturbance.    Physical Exam: Filed Vitals:   08/02/10 1223  BP: 124/80  Pulse: 72   This is a well-developed well-nourished gentleman in no distress.Pupils equal and reactive.   Extraocular  Movements are full.  There is no scleral icterus.  The mouth and pharynx are normal.  The neck is supple.  The carotids reveal no bruits.  The jugular venous pressure is normal.  The thyroid is not enlarged.  There is no lymphadenopathy.The chest is clear to percussion and auscultation. There are no rales or rhonchi. Expansion of the chest is symmetrical.The precordium is quiet.  The first heart sound is normal.  The second heart sound is physiologically split.  There is no murmur gallop rub or click.  There is no abnormal lift or heave.The abdomen is soft and nontender. Bowel sounds are normal. The liver and spleen are not enlarged. There Are no abdominal masses. There are no bruits.The pedal pulses are good.  There is no  phlebitis.There is trace pretibial and ankle edema.  There is no cyanosis or clubbing.Strength is normal and symmetrical in all extremities.  There is no lateralizing weakness.  There are no sensory deficits.The skin is warm and dry.  There is no rash.  Assessment / Plan:

## 2010-08-02 NOTE — Progress Notes (Signed)
History of Present Illness:   Current Outpatient Prescriptions  Medication Sig Dispense Refill  . amiodarone (PACERONE) 200 MG tablet Take 200 mg by mouth daily.        . Ascorbic Acid (VITAMIN C PO) Take 1 tablet by mouth daily.        Marland Kitchen atorvastatin (LIPITOR) 10 MG tablet Take 10 mg by mouth daily.        . Cholecalciferol (VITAMIN D3) 5000 UNITS TABS Take 5,000 Units by mouth daily.        Marland Kitchen diltiazem (CARDIZEM CD) 240 MG 24 hr capsule Take 240 mg by mouth daily.        [name] Calcium (STOOL SOFTENER PO) Take by mouth as needed.        . metoprolol tartrate (LOPRESSOR) 25 MG tablet Take 25 mg by mouth 2 (two) times daily.        . Multiple Vitamin (MULTIVITAMIN) tablet Take 1 tablet by mouth daily.        . Multiple Vitamins-Minerals (ICAPS PO) Take by mouth daily.        Marland Kitchen omeprazole (PRILOSEC) 20 MG capsule Take 20 mg by mouth daily.        . Tamsulosin HCl (FLOMAX) 0.4 MG CAPS Take 0.4 mg by mouth daily.        Marland Kitchen warfarin (COUMADIN) 5 MG tablet Take 5 mg by mouth as directed. 5 MG x 6 DAYS A WEEK, NO COUMADIN 1 DAY A WEEK         Allergies  Allergen Reactions  . Penicillins     Patient Active Problem List  Diagnoses  . Atrial fibrillation    History  Smoking status  . Former Smoker  . Types: Cigarettes  . Quit date: 05/06/1978  Smokeless tobacco  . Not on file    History  Alcohol Use     Family History  Problem Relation Age of Onset  . Heart attack Father   . Lung disease Mother   . Atrial fibrillation Brother     Review of Systems: Constitutional: no fever chills diaphoresis or fatigue or change in weight.  Head and neck: no hearing loss, no epistaxis, no photophobia or visual disturbance. Respiratory: No cough, shortness of breath or wheezing. Cardiovascular: No chest pain peripheral edema, palpitations. Gastrointestinal: No abdominal distention, no abdominal pain, no change in bowel habits hematochezia or melena. Genitourinary: No dysuria, no  frequency, no urgency, no nocturia. Musculoskeletal:No arthralgias, no back pain, no gait disturbance or myalgias. Neurological: No dizziness, no headaches, no numbness, no seizures, no syncope, no weakness, no tremors. Hematologic: No lymphadenopathy, no easy bruising. Psychiatric: No confusion, no hallucinations, no sleep disturbance.    Physical Exam:    Assessment / Plan:

## 2010-08-20 ENCOUNTER — Institutional Professional Consult (permissible substitution): Payer: Medicare Other | Admitting: Internal Medicine

## 2010-08-20 LAB — CBC
HCT: 41.8 % (ref 39.0–52.0)
MCHC: 32.9 g/dL (ref 30.0–36.0)
MCV: 101.9 fL — ABNORMAL HIGH (ref 78.0–100.0)
Platelets: 265 10*3/uL (ref 150–400)
RDW: 13.3 % (ref 11.5–15.5)

## 2010-08-20 LAB — BODY FLUID CULTURE: Culture: NO GROWTH

## 2010-08-20 LAB — GRAM STAIN

## 2010-08-20 LAB — SYNOVIAL CELL COUNT + DIFF, W/ CRYSTALS
Crystals, Fluid: NONE SEEN
Lymphocytes-Synovial Fld: 4 % (ref 0–20)
Neutrophil, Synovial: 82 % — ABNORMAL HIGH (ref 0–25)
WBC, Synovial: 48 /mm3 (ref 0–200)

## 2010-08-20 LAB — DIFFERENTIAL
Basophils Relative: 0 % (ref 0–1)
Eosinophils Absolute: 0.1 10*3/uL (ref 0.0–0.7)
Eosinophils Relative: 2 % (ref 0–5)
Lymphs Abs: 1.2 10*3/uL (ref 0.7–4.0)
Monocytes Relative: 9 % (ref 3–12)
Neutrophils Relative %: 74 % (ref 43–77)

## 2010-08-20 LAB — COMPREHENSIVE METABOLIC PANEL
ALT: 41 U/L (ref 0–53)
AST: 28 U/L (ref 0–37)
Alkaline Phosphatase: 68 U/L (ref 39–117)
CO2: 28 mEq/L (ref 19–32)
Calcium: 9.1 mg/dL (ref 8.4–10.5)
GFR calc Af Amer: >60 mL/min (ref >60)
GFR calc non Af Amer: >60 mL/min (ref >60)
Glucose, Bld: 98 mg/dL (ref 70–99)
Potassium: 3.9 mEq/L (ref 3.5–5.1)
Sodium: 139 mEq/L (ref 135–145)
Total Protein: 7.1 g/dL (ref 6.0–8.3)

## 2010-08-20 LAB — PROTIME-INR: INR: 1.5 (ref 0.00–1.49)

## 2010-08-24 ENCOUNTER — Telehealth: Payer: Self-pay | Admitting: Cardiology

## 2010-08-24 NOTE — Telephone Encounter (Deleted)
Last OV, Lab work and EKG and EKG and EKG strips Fax [phone]

## 2010-08-27 ENCOUNTER — Ambulatory Visit (INDEPENDENT_AMBULATORY_CARE_PROVIDER_SITE_OTHER): Payer: Medicare Other | Admitting: Internal Medicine

## 2010-08-27 ENCOUNTER — Encounter: Payer: Self-pay | Admitting: *Deleted

## 2010-08-27 ENCOUNTER — Encounter: Payer: Self-pay | Admitting: Internal Medicine

## 2010-08-27 VITALS — BP 150/70 | HR 60 | Ht 71.0 in | Wt 215.0 lb

## 2010-08-27 DIAGNOSIS — I4892 Unspecified atrial flutter: Secondary | ICD-10-CM

## 2010-08-27 DIAGNOSIS — E785 Hyperlipidemia, unspecified: Secondary | ICD-10-CM

## 2010-08-27 DIAGNOSIS — I10 Essential (primary) hypertension: Secondary | ICD-10-CM

## 2010-08-27 NOTE — Progress Notes (Signed)
The patient presents today for routine electrophysiology followup.  Since last being seen by me in the hospital 2/12, the patient reports doing very well.  At that time, he was documented to have 2:1 typical appearing atrial flutter in the setting of acute hematoma and DVT s/p R hip replacement several weeks earlier.  He was cardioverted with plans to return to follow-up with me once medically stable.  He has recovered and is presently doing well.  He reports brief palpitations several days ago, but otherwise appears to be maintaining sinus rhythm with amiodarone.  Today, he denies symptoms of palpitations, chest pain, shortness of breath, orthopnea, PND, dizziness, presyncope, syncope, or neurologic sequela. The swelling in his R leg has improved.  The patient feels that he is tolerating medications without difficulties and is otherwise without complaint today.   Past Medical History  Diagnosis Date  . Hypertension   . Atrial flutter   . Hyperlipidemia   . Vitamin D deficiency   . RBBB (right bundle branch block)   . BPH (benign prostatic hyperplasia)   . Osteoarthritis   . Anemia   . Peptic ulcer disease    Past Surgical History  Procedure Date  . Total hip arthroplasty 06/04/10    RIGHT HIP  . Cardioversion FEB 2012    DR. Anais Koenen  . Total knee arthroplasty   . Transurethral resection of prostate     Current Outpatient Prescriptions  Medication Sig Dispense Refill  . amiodarone (PACERONE) 200 MG tablet Take 200 mg by mouth daily.        . Ascorbic Acid (VITAMIN C PO) Take 1 tablet by mouth daily.        Marland Kitchen atorvastatin (LIPITOR) 10 MG tablet Take 10 mg by mouth daily.        . Cholecalciferol (VITAMIN D3) 5000 UNITS TABS Take 5,000 Units by mouth daily.        Marland Kitchen diltiazem (CARDIZEM CD) 240 MG 24 hr capsule Take 240 mg by mouth daily.        [name] Calcium (STOOL SOFTENER PO) Take by mouth as needed.        . metoprolol tartrate (LOPRESSOR) 25 MG tablet Take 25 mg by mouth 2 (two)  times daily.        . Multiple Vitamin (MULTIVITAMIN) tablet Take 1 tablet by mouth daily.        . Multiple Vitamins-Minerals (ICAPS PO) Take by mouth daily.        Marland Kitchen omeprazole (PRILOSEC) 20 MG capsule Take 20 mg by mouth daily.        . Tamsulosin HCl (FLOMAX) 0.4 MG CAPS Take 0.4 mg by mouth daily.        Marland Kitchen warfarin (COUMADIN) 5 MG tablet Take 5 mg by mouth as directed. 5 MG x 6 DAYS A WEEK, NO COUMADIN 1 DAY A WEEK         Allergies  Allergen Reactions  . Penicillins     History   Social History  . Marital Status: Married    Spouse Name: N/A    Number of Children: N/A  . Years of Education: N/A   Occupational History  . Not on file.   Social History Main Topics  . Smoking status: Former Smoker    Types: Cigarettes    Quit date: 05/06/1978  . Smokeless tobacco: Never Used  . Alcohol Use: No  . Drug Use: No  . Sexually Active: Not on file   Other Topics Concern  .  Not on file   Social History Narrative   he patient lives in [place] with his spouse    Family History  Problem Relation Age of Onset  . Heart attack Father   . Lung disease Mother   . Atrial fibrillation Brother     ROS-  All systems are reviewed and are negative except as outlined in the HPI above  Physical Exam: Filed Vitals:   08/27/10 1135  BP: 150/70  Pulse: 60  Height: 5\' 11"  (1.803 m)  Weight: 215 lb (97.523 kg)    GEN- The patient is well appearing, alert and oriented x 3 today.   Head- normocephalic, atraumatic Eyes-  Sclera clear, conjunctiva pink Ears- hearing intact Oropharynx- clear Neck- supple, no JVP Lymph- no cervical lymphadenopathy Lungs- Clear to ausculation bilaterally, normal work of breathing Heart- Regular rate and rhythm, no murmurs, rubs or gallops, PMI not laterally displaced GI- soft, NT, ND, + BS Extremities- no clubbing, cyanosis, 1+ BLE edema MS- no significant deformity or atrophy,  Ambulating well s/p R THA Skin- no rash or lesion Psych- euthymic  mood, full affect Neuro- strength and sensation are intact  EKG today reveals sinus rhythm 60 bpm, PR 166, RBBB, otherwise normal ekg  Assessment and Plan:

## 2010-08-27 NOTE — Assessment & Plan Note (Signed)
Stable No change required today  

## 2010-08-27 NOTE — Assessment & Plan Note (Signed)
The patient has recurrent symptomatic typical appearing atrial flutter despite medical therapy with amiodarone.  I have looked extensively through his medical record and see only atrial flutter documented, without atrial fibrillation.  He is chronically anticoagulated with coumadin. Therapeutic strategies for atrial flutter including medicine and ablation were discussed in detail with the patient today. Risk, benefits, and alternatives to EP study and radiofrequency ablation were also discussed in detail today. These risks include but are not limited to stroke, bleeding, vascular damage, tamponade, perforation, damage to the heart and other structures, AV block requiring pacemaker, worsening renal function, and death. The patient understands these risk and wishes to proceed.  We will therefore proceed with catheter ablation once the patient has been adequately anticoagulated.  Given his prior R leg DVT, we will likely perform the majority of the procedure through the L leg.

## 2010-08-27 NOTE — Patient Instructions (Signed)

## 2010-09-03 ENCOUNTER — Ambulatory Visit (INDEPENDENT_AMBULATORY_CARE_PROVIDER_SITE_OTHER): Payer: Medicare Other | Admitting: *Deleted

## 2010-09-03 DIAGNOSIS — I4891 Unspecified atrial fibrillation: Secondary | ICD-10-CM

## 2010-09-04 HISTORY — PX: ATRIAL FLUTTER ABLATION: SHX5733

## 2010-09-11 ENCOUNTER — Ambulatory Visit (INDEPENDENT_AMBULATORY_CARE_PROVIDER_SITE_OTHER): Payer: Medicare Other | Admitting: *Deleted

## 2010-09-11 DIAGNOSIS — I4891 Unspecified atrial fibrillation: Secondary | ICD-10-CM

## 2010-09-11 LAB — POCT INR: INR: 2.3

## 2010-09-18 ENCOUNTER — Encounter: Payer: Medicare Other | Admitting: *Deleted

## 2010-09-18 ENCOUNTER — Other Ambulatory Visit (INDEPENDENT_AMBULATORY_CARE_PROVIDER_SITE_OTHER): Payer: Medicare Other | Admitting: *Deleted

## 2010-09-18 DIAGNOSIS — I4892 Unspecified atrial flutter: Secondary | ICD-10-CM

## 2010-09-18 LAB — CBC WITH DIFFERENTIAL/PLATELET
Eosinophils Relative: 2.8 % (ref 0.0–5.0)
HCT: 42.7 % (ref 39.0–52.0)
Hemoglobin: 14.4 g/dL (ref 13.0–17.0)
Lymphocytes Relative: 21.4 % (ref 12.0–46.0)
Lymphs Abs: 1 10*3/uL (ref 0.7–4.0)
Monocytes Relative: 12.7 % — ABNORMAL HIGH (ref 3.0–12.0)
Neutro Abs: 3 10*3/uL (ref 1.4–7.7)
Platelets: 135 10*3/uL — ABNORMAL LOW (ref 150.0–400.0)
WBC: 4.8 10*3/uL (ref 4.5–10.5)

## 2010-09-18 LAB — PROTIME-INR: Prothrombin Time: 20.6 s — ABNORMAL HIGH (ref 10.2–12.4)

## 2010-09-18 LAB — BASIC METABOLIC PANEL
BUN: 18 mg/dL (ref 6–23)
CO2: 30 mEq/L (ref 19–32)
Calcium: 8.7 mg/dL (ref 8.4–10.5)
Chloride: 106 mEq/L (ref 96–112)
Creatinine, Ser: 0.7 mg/dL (ref 0.4–1.5)
Glucose, Bld: 67 mg/dL — ABNORMAL LOW (ref 70–99)

## 2010-09-18 NOTE — Assessment & Plan Note (Signed)
[facility]                                 ON-CALL NOTE   RAY, [name]                         MRN:          [id number]  DATE:04/08/2010                            DOB:          1933/06/11    PRIMARY CARDIOLOGIST:  Cassell Clement, MD   PROBLEM:  The patient contacted me this past weekend, December 4, with  complaint of tachy palpitations.  Of note, however, he denies any  significant associated symptoms.  He was on a regimen of amiodarone 200  mg daily, diltiazem 240 mg daily, Toprol XL 25 mg daily.  He had already  taken 1 additional Toprol tablet, prior to contacting me.  He was also  undergoing evaluation with a colonoscopy, scheduled this week, and had  been taken off Coumadin on November 30.   PLAN:  I advised him to take a second additional tablet of Toprol XL 25  mg, and to call me back in approximately 1 hour.  He did this, and  reported amelioration of his heart rate, down to the 80 bpm range.  No  further recommendations were made, and I instructed him to relay this  information with Dr. Patty Sermons, at the beginning of this week.  He was  in agreement with this recommendation.     Gene Serpe, PA-C     GS/MedQ  DD: 04/11/2010  DT: 04/12/2010  Job #: [id number]

## 2010-09-18 NOTE — Discharge Summary (Signed)
NAME:  Connor [name], Connor [name] NO.:  [id number]   MEDICAL RECORD NO.:  [id number]          PATIENT TYPE:  INP   LOCATION:  3737                         FACILITY:  [facility]   PHYSICIAN:  Cassell Clement, M.D. DATE OF BIRTH:  [birth year]   DATE OF ADMISSION:  04/25/2008  DATE OF DISCHARGE:  04/28/2008                               DISCHARGE SUMMARY   DISCHARGE DIAGNOSES:  1. New-onset atrial flutter.  2. Old right bundle-branch block.  3. Essential hypertension.  4. History of hypercholesterolemia.  5. History of benign prostatic hypertrophy.   OPERATIONS PERFORMED:  Two-dimensional echocardiogram.   HISTORY:  This 77 year old married Caucasian gentleman presented to my  office on April 25, 2008, with complaint of weakness and irregular  pulse.  He had been shoveling snow on April 23, 2008, and after  taking just a few shovels from his porch steps, he noted dizziness and  weakness, but no chest pain.  He felt like he might pass out, so he sat  down on the front steps.  Symptoms improved, he went into the house and  rested.  He rested over the weekend and when the symptoms of irregular  pulse had not converted, he came to the office and was noted to be in  new onset of atrial flutter.  He has a remote history of having been  hospitalized with atrial flutter in March 2006, and at that time, he  converted with medication, did not have to be shocked.  Since then, he  has been on low-dose amiodarone and recently the dosage was decreased to  100 mg a day.  He does not have any history of known coronary artery  disease.  He had a Cardiolite stress test on July 15, 2003, which was  negative for ischemia.  He had a known right bundle-branch block as far  back as 2005, and he has a family history of coronary artery disease and  on personal history of hypercholesterolemia.  He avoids caffeine.  He  has 1 glass of wine or 1 beer each night with supper.  He quit smoking  35  years ago.   PHYSICAL EXAMINATION:  VITAL SIGNS:  On admission, blood pressure is  104/70, pulse is 84 and irregular, and weight 202.  GENERAL APPEARANCE:  Well-developed and well-nourished gentleman, in no  distress.  HEENT:  Negative.  NECK:  Jugular venous pressure normal.  Carotids normal.  CHEST:  Clear.  HEART:  No murmur, gallop, rub, or click.  ABDOMEN:  Soft and nontender.  EXTREMITIES:  No phlebitis.  No edema.   HOSPITAL COURSE:  He was admitted to telemetry.  We started him on IV  amiodarone.  We also started him on subcutaneous Lovenox and started him  on Coumadin on admission.  It was noted that he had already been in  atrial flutter for 48 hours prior to admission.  A 2-dimensional echo  was obtained, which showed left ventricular systolic function to the  lower limits of normal with an ejection fraction of 50-55% and there  were no regional wall motion abnormalities.  The left  atrium was normal,  and there were no thrombi seen in the left atrium.  He did have mild  aortic valve sclerosis with mild aortic valve regurgitation, and he had  normal pulmonary artery pressure.   In the hospital, he was continuously monitored on telemetry.  Initially,  he was given IV amiodarone and failed to convert.  The following day, we  stopped the IV amiodarone, continued oral amiodarone, and began on IV  Cardizem drip.  The patient did not convert on IV Cardizem either  although it did control his ventricular response.  His amiodarone in the  hospital was increased to 400 mg b.i.d.  By April 28, 2008, his INR  was beginning to respond to Coumadin and was up to 1.6 after having  received 10 mg of Coumadin a day before.  It was felt that the patient  could be safely discharged home with Lovenox bridging for the next 36  hours, and we will empirically continue warfarin 5 mg a day, and we will  plan to see him back in the office on Monday, May 02, 2008, after  the Christmas  holiday for an office visit, protime, and EKG.  The  patient in the hospital was successfully demonstrated that he could give  himself his Lovenox.  The patient had no chest pain in the hospital, and  was clinically stable at discharge.   ACCESSORY LABORATORY DATA:  Hemoglobin 14.6, white count 7200, and  platelet count of 144,000.  Sodium 142, potassium 3.8, BUN 12,  creatinine 0.75, and blood sugar 140.  INR at discharge is 1.6 and  protime 20.  Liver function studies were normal.  Cardiac enzymes showed  normal CK-MBs 0.9 and 0.8 with slightly elevated troponin of 0.09 and  0.10 consistent with arrhythmia.  B-type natriuretic peptide was 64.  TSH was 1.56.  Free T4 was 0.98.  Chest x-ray showed normal heart size  and no heart failure or infiltrate or other abnormalities.   The patient is being discharged on April 28, 2008.  He will be seen  in the office on May 02, 2008, at 1:45 p.m. for office visit,  protime, and an EKG.   DISCHARGE MEDICATIONS:  1. Amiodarone 200 mg twice a day.  2. Aspirin 81 mg daily..  3. Warfarin 5 mg daily or as directed.  4. Flomax 0.4 mg daily.  5. Lipitor 10 mg daily.  6. Toprol-XL 25 mg daily.  7. Vitamin C 500 mg daily.  8. Zinc 1 daily.  9. Stool softener 1 daily.  10.Multivitamin 1 daily.  11.Lovenox 90 mg subcu once this evening and twice tomorrow every 12      hours.  12.He will also have Cardizem 30 mg tablets, 1 every 6 hours on hand      if needed for fast heart rate.   CONDITION ON DISCHARGE:  Improved.   Time spent in discharge planning and explanation to the patient, greater  than 30 minutes.           ______________________________  Cassell Clement, M.D.     TB/MEDQ  D:  04/28/2008  T:  04/28/2008  Job:  [id number]   cc:   Reather Littler, M.D.

## 2010-09-18 NOTE — Op Note (Signed)
NAME:  [name], Connor [name] NO.:  [id number]   MEDICAL RECORD NO.:  [id number]          PATIENT TYPE:  OIB   LOCATION:  2899                         FACILITY:  [facility]   PHYSICIAN:  Cassell Clement, M.D. DATE OF BIRTH:  26-Jun-1933   DATE OF PROCEDURE:  05/20/2008  DATE OF DISCHARGE:  05/20/2008                               OPERATIVE REPORT   PROCEDURE:  Electrical cardioversion.   HISTORY:  This 77 year old gentleman has had atrial flutter.  He has  been anticoagulated for an adequate period of time and comes in now for  elective cardioversion.   The patient was given IV anesthesia.  Following which, he was given a  single 50-J shock using the AP pads and converted promptly to normal  sinus rhythm.  The patient tolerated the procedure well.  There were no  neurologic complications.  The patient will continue his present  medications except for a reduction in his diltiazem and this will be  discussed with his wife.  He has an appointment to return to the office  next week for followup.           ______________________________  Cassell Clement, M.D.     TB/MEDQ  D:  05/20/2008  T:  05/21/2008  Job:  [id number]

## 2010-09-18 NOTE — H&P (Signed)
NAME:  Connor [name], HUYNH NO.:  [id number]   MEDICAL RECORD NO.:  [id number]          PATIENT TYPE:  INP   LOCATION:  3731                         FACILITY:  [facility]   PHYSICIAN:  Cassell Clement, M.D. DATE OF BIRTH:  04-13-1934   DATE OF ADMISSION:  04/25/2008  DATE OF DISCHARGE:                              HISTORY & PHYSICAL   CHIEF COMPLAINT:  Weakness and irregular pulse.   HISTORY:  This is a 77 year old married Caucasian gentleman admitted  with new onset of atrial flutter.  The patient was shoveling snow on  April 23, 2008 afternoon and after shoveling just about 6 shovels  full, he developed dizziness and weakness but no chest pain.  He felt  like he might pass out and so he sat down on the front steps until his  symptoms improved and then he went in house and rested.  There was no  chest pain but he felt anxious.  He was not particularly short of  breath.  Over the weekend, he rested and came to the office today as a  work-in and his EKG shows new atrial flutter with a pattern of a right  bundle-branch block.  Of note is the fact that the patient was  hospitalized on July 04, 2004 with new onset of atrial flutter at that  time.  He converted without having to be electrically cardioverted and  since that time he has been on low-dose amiodarone which has gradually  been reduced in his dosage and now he has been on just 100 mg a day.   The patient does not have any history of known coronary artery disease.  His last Cardiolite stress test was July 15, 2003 and was negative for  ischemia at that time.  He has had a known right bundle branch block as  far back as 2005.  He has had a history of hypercholesterolemia and a  family history of coronary artery disease.   His present medications are:  1. Lipitor 10 mg daily.  2. Aspirin 81 mg daily.  3. Amiodarone 100 mg daily.  4. Vitamin C one daily.  5. Zinc one daily.  6. Multivitamin daily.  7. Flomax  0.4 mg daily.  8. Toprol-XL 25 mg generic daily.  9. He was recently begun on an antihypertensive pill from Dr. Lucianne Muss      which he thinks may be spironolactone but he is not sure.  He is      taking half to one dose a day.   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  He quit smoking  about 35 years ago.  He drinks one beer or one glass of wine each night  with supper.  He avoids caffeine and he drinks decaff coffee.  The  patient is married, has two children.   Previous surgery includes five knee surgeries including two total knee  replacements one on each knee.  He has also had a TURP for BPH.  He  gives a history  of having had a bleeding ulcer in 2004 but was  hospitalized briefly and did not require transfusion.   REVIEW OF SYSTEMS:  GASTROINTESTINAL:  No recent history of any  dyspepsia or peptic ulcer symptoms.  GENITOURINARY:  Good urinary flow  now status post TURP and on Flomax.  He denies any cough or sputum  production.  He denies any fever, chills or constitutional symptoms.  All others systems negative in detail.   FAMILY HISTORY:  Father began having heart attacks in his late 66s and  succumbed to his third or fourth heart attack at age 48.  The patient  also has a 76 year old brother who has had atrial fibrillation and has  had EP ablation at [facility].   ALLERGIES:  The patient is allergic to PENICILLIN.   PHYSICAL EXAMINATION:  VITAL SIGNS:  His weight is 202, blood pressure  104/70, pulse is 84 and irregularly irregular.  GENERAL APPEARANCE:  A well-developed, well-nourished tanned gentleman  in no acute distress.  HEENT:  Pupils equal and reactive.  Extraocular movements are full.  Sclerae nonicteric.  Mouth and pharynx normal.  NECK:  Carotids normal.  Thyroid normal.  No lymphadenopathy.  CHEST:  Clear.  HEART:  No murmur, gallop, rub, or click.  Rhythm is irregular.  ABDOMEN:   Soft without hepatosplenomegaly or masses.  Bowel sounds are  active.  EXTREMITIES:  No phlebitis or edema.  Pedal pulses are good.  There is  no cyanosis or clubbing.  SKIN:  No skin rash.  NEUROLOGIC:  Physiologic.   IMPRESSION:  1. New atrial flutter.  2. Right bundle branch block, old.  3. Essential hypertension.  4. History of hypercholesterolemia.  5. History of BPH.   DISPOSITION:  We are admitting to telemetry.  We will start on IV  amiodarone.  We will also start Lovenox as we coumadinize him.  It has  been 48 hours since the onset of the arrhythmia.  We will plan to get a  2-D echo.  Although, he has had no chest pain, he has had weakness and  we will check one set of cardiac enzymes.  We will also check thyroid  function studies.           ______________________________  Cassell Clement, M.D.     TB/MEDQ  D:  04/25/2008  T:  04/26/2008  Job:  [id number]   cc:   Reather Littler, M.D.

## 2010-09-21 NOTE — Procedures (Signed)
[place]  Patient:    Connor [name], Connor [name]                    MRN: [id number] Proc. Date: 05/20/00 Adm. Date:  [id number] Attending:  Louie Bun CC:         Reather Littler, M.D.   Procedure Report  PROCEDURE:  Colonoscopy with polypectomy.  INDICATION FOR PROCEDURE:  Recent onset of rectal bleeding in a 77 year old pt with no recent colon screening.  DESCRIPTION OF PROCEDURE:  The patient was placed in the left lateral decubitus position and placed on the pulse monitor with continuous low-flow oxygen delivered by nasal cannula.  He was sedated with 70 mg of IV Demerol and 7 mg IV Versed.  The Olympus video colonoscope was inserted into the rectum and advanced to the cecum, confirmed by transillumination at McBurneys point and visualization into the ileocecal valve and appendiceal orifice.  The prep was good distally but somewhat suboptimal in the ascending colon and cecum, and I could not rule out small lesions less than 1 cm in those areas in all locations.  Otherwise, the cecum, ascending, transverse, and descending colon appeared normal with no masses, polyps, diverticula, or other mucosal abnormalities.  Within the sigmoid colon there were seen a few scattered diverticula and no other abnormalities.  In the rectum at 10 cm was about a 6 mm sessile polyp, which was fulgurated by hot biopsy, but the remainder of the rectum appeared normal.  There were small internal hemorrhoids seen on retroflexed view with no stigma of hemorrhage.  The colonoscope was then withdrawn and the patient returned to the recovery room in stable condition. He tolerated the procedure well, and there were no immediate complications.  IMPRESSION: 1. Internal hemorrhoids. 2. Small rectal polyp.  PLAN:  Await histology for determination of future interval and method for future colon screening. DD:  05/20/00 TD:  05/20/00 Job: [id number]

## 2010-09-21 NOTE — Op Note (Signed)
NAME:  Connor [name], Connor [name]                       ACCOUNT NO.:  [id number]   MEDICAL RECORD NO.:  [id number]                   PATIENT TYPE:  INP   LOCATION:  5511                                 FACILITY:  [facility]   PHYSICIAN:  Petra Kuba, M.D.                 DATE OF BIRTH:  [birth year]   DATE OF PROCEDURE:  12/28/2001  DATE OF DISCHARGE:                                 OPERATIVE REPORT   PROCEDURE:  Esophagogastroduodenoscopy with biopsy.   INDICATIONS:  Melena, decreased hemoglobin.  Consent was signed after risks,  benefits, methods, and options thoroughly discussed with both the wife and  the patient.   MEDICATIONS:  Demerol 70 mg, Versed 7.5 mg.   DESCRIPTION OF PROCEDURE:  The video endoscope was inserted by direct  vision.  The proximal and midesophagus were normal.  In the distal esophagus  was a small hiatal hernia.  The scope passed into the stomach, no blood was  seen, advanced to the antrum.  There were multiple small antral erosions and  a small linear antral ulcer.  The scope was inserted through the pylorus  into a normal duodenal bulb except for some small, shallow bulb erosions and  around the C-loop to a normal second portion of the duodenum.  No blood was  seen distally.  The scope was withdrawn back to the bulb, and a good look  there ruled out significant ulcer disease in that location.  The scope was  withdrawn back to the stomach and retroflexed.  Angularis, cardia, and  fundus were all normal except for the hiatal hernia being confirmed in the  cardia.  Lesser and greater curve were evaluated on retroflex and then  straight visualization without additional findings.  The scope was  straightened.  The antrum was washed and watched.  The shallow ulcer could  not be made to bleed.  One biopsy for the CLOtest was obtained to rule out  Helicobacter.  Air and water were suctioned and the scope then slowly  withdrawn.  Again a good look at the esophagus was  normal.  The scope was  removed.  The patient tolerated the procedure well.  There was no obvious  immediate complication.   ENDOSCOPIC DIAGNOSES:  1. Small hiatal hernia.  2. Antral and bulb erosions, status post CLO biopsy to rule out     Helicobacter.  3. Small antral linear ulcer.  4. Otherwise within normal limits EGD without any heme being seen.   PLAN:  Pump inhibitors b.i.d.  No aspirin or nonsteroidals for some time.  Will follow labs, stool color and BUN, and decide any other workup and  plans; however, with a recent colonoscopy not showing diverticula or  significant findings, probably do not need any further workup unless  bleeding continues.  Might need a small bowel follow-through or a nuclear  bleeding scan.  Might need other arthritis medicines like Darvocet, Vicodin,  Ultram, possibly could even try a cox inhibitor but would use long-term pump  inhibitors as long as arthritis pills are used.  Also  would think about if aspirin prevention is needed in the future, possibly  considering Plavix, which would be less caustic to the stomach.  Certainly  long-term pump inhibitors if the aspirin is needed as well, and if  Helicobacter-positive, would treat.                                               Petra Kuba, M.D.    MEM/MEDQ  D:  12/28/2001  T:  12/31/2001  Job:  [id number]   cc:   Reather Littler, M.D.  [address]  [place]  [place] [address]  Fax: [phone]   Barrie Folk, M.D.  [phone]  [place]  [place] [address]  Fax: [phone]

## 2010-09-21 NOTE — H&P (Signed)
NAME:  Connor [name], Connor [name] NO.:  [id number]   MEDICAL RECORD NO.:  [id number]          PATIENT TYPE:  EMS   LOCATION:  MAJO                         FACILITY:  [facility]   PHYSICIAN:  Cassell Clement, M.D. DATE OF BIRTH:  09-Mar-1934   DATE OF ADMISSION:  07/04/2004  DATE OF DISCHARGE:                                HISTORY & PHYSICAL   CHIEF COMPLAINT:  Rapid heart action and dizziness.   HISTORY:  This is a 77 year old married, Caucasian gentleman, admitted  through the emergency room after he experienced new onset of atrial flutter,  chest tightness, weakness, and dizziness. He does not have any prior history  of atrial flutter. We had seen him in our office on July 12, 2003, for  evaluation for possible cardiovascular disease because of a strong family  history. At that time he was on blood pressure medication in the form of  generic Ziac 5 mg daily and was on Lipitor 10 mg a day and an 81 mg aspirin.  His workup at that time included a subsequent treadmill Cardiolite stress  test which showed no evidence of ischemia and showed an ejection fraction of  49% and no wall motion abnormalities.   The patient was in his usual state of health last evening when at about  10:00 he noted that his heart rate beating very fast at 140. He did not have  any associated chest pain at the time and it did not prevent him from  getting a normal night's sleep. When he woke up this morning his blood  pressure was normal and his pulse was regular at about 80, but then over the  course of the day his pulse accelerated to 146. He went to see Dr. Lucianne Muss in  the office and was referred to the emergency room for new atrial flutter. He  noted some slight chest tightness and also felt quite dizzy. His wife drove  him to the emergency room and he was busy getting in the car, and this  improved when he was able to recline the seat back for several minutes.  After arrival in the emergency room he was  placed on telemetry and was in  atrial flutter with a 2 to 1 ventricular response and the response was 146  with an atrial rate of approximately 300.  He was started on IV Cardizem and  promptly converted to normal sinus rhythm. At the present time he is pain  free and is asymptomatic.   His family history reveals that his father began having heart attacks in his  late 28s and succumbed to his third or fourth heart attack at age 20.  The  patient has a 76 year old brother who has had atrial fibrillation and has  had EP ablation attempts done at [facility]. Apparently the attempt was partially  successful.   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 He quit  smoking about 30 years ago. He does have about one glass of wine per night.  He does drink some caffeine in the form of diet [facility] and he did have  one yesterday.   REVIEW OF SYSTEMS:  He has a history of kidney stones, PTH, and is followed  by Dr. Wanda Plump. He had a TURP in 2004.  The patient was hospitalized in  2004 with a bleeding ulcer which did not require transfusion. The patient  has not been experiencing any recent dyspepsia or peptic ulcer symptoms. The  remainder of the review of systems is negative.   ALLERGIES:  The patient is allergic to PENICILLIN.   PHYSICAL EXAMINATION:  VITAL SIGNS: His blood pressure on arrival was  117/72, pulse 146. After IV Cardizem and he converted to sinus rhythm, his  pulse was 75 and his blood pressure was 120/73.  HEENT/NECK: Unremarkable. Carotids reveal no bruits. Jugular venous pressure  normal. Thyroid normal.  CHEST: Clear.  HEART: Normal first and second heart sounds without murmurs, rubs, gallops,  or clicks.  ABDOMEN: Soft without hepatosplenomegaly or masses.  EXTREMITIES: Good peripheral pulses. No edema. No phlebitis.   His first electrocardiogram shows atrial flutter with  intermittent right  bundle branch block and a rapid ventricular response of 146.  The second EKG  is normal sinus rhythm and is within normal limits.  Chest x-ray shows  normal heart size and no active disease. His initial cardiac enzymes are  negative times one. His potassium is borderline low at 3.6.   IMPRESSION:  1.  Paroxysmal atrial flutter, resolved.  2.  Chest pain, resolved.  3.  History of hypertension.  4.  History of hypercholesterolemia.  5.  Borderline hypokalemia.   DISPOSITION:  Admit to observation bed on telemetry. Will continue the IV  heparin that has been started. Will stop the IV Cardizem and switch to oral  Lopressor. Will get serial enzymes and EKGs. Will rule out other causes of  atrial arrhythmias such as hyperthyroidism. Expect that the arrhythmia may  have been triggered by the drinking of the [facility] yesterday. He will  try to avoid caffeinated drinks in the future.      TB/MEDQ  D:  07/04/2004  T:  07/04/2004  Job:  [id number]   cc:   Reather Littler, M.D.  [address]  [place]  [place] [address]  Fax: [phone]

## 2010-09-21 NOTE — Consult Note (Signed)
NAME:  Connor [name], Connor [name]                       ACCOUNT NO.:  [id number]   MEDICAL RECORD NO.:  [id number]                   PATIENT TYPE:  INP   LOCATION:  5511                                 FACILITY:  [facility]   PHYSICIAN:  Domingo Sep                          DATE OF BIRTH:  01/06/1934   DATE OF CONSULTATION:  12/28/2001  DATE OF DISCHARGE:  12/29/2001                                   CONSULTATION   HISTORY:  Patient with an essentially negative GI history who has been on  both aspirin and Daypro for some time, noticed on Saturday some increased  black diarrhea with some increased weakness, dizziness and some upper tract  symptoms presented to Dr. Lucianne Muss today who diagnosed him with GI bleeding,  admitted him to the hospital and asked me to see the patient.  The patient  has had no previous GI problems.  He did have an uneventful screening  colonoscopy last year where hyperplastic polyps only were seen.  There were  no diverticula based on the nurse's report.  He has had some weakness and  dizziness today.  He has not been on any Pepto-Bismol.  He did take some  Tums for his upper tract symptoms.   PAST MEDICAL HISTORY:  Pertinent for bilateral knee replacement and some  arthritis.  Some history of hypertension and a history of arrhythmias.  High  cholesterol, bunionectomy and a vasectomy.   FAMILY HISTORY:  Negative for any obvious GI problems.   SOCIAL HISTORY:  He does not smoke but does drink socially and will use some  over-the-counter medicines plus a daily aspirin.   ALLERGIES:  PENICILLIN.   CURRENT MEDICATIONS:  Daypro, Lipitor, beta blocker, aspirin and Flomax.   REVIEW OF SYSTEMS:  Negative except above.  He has no swelling problems.   PHYSICAL EXAMINATION:  VITAL SIGNS:  See chart.  GENERAL:  No acute distress.  HEENT:  Sclerae are nonicteric.  NECK:  Supple without adenopathy.  LUNGS:  Clear.  Regular rate and rhythm.  ABDOMEN:  Soft, nontender.  Good bowel  sounds.   LABORATORY DATA:  CBC pertinent for hemoglobin 11.2 with an MCV of 104,  platelet count of 173.   ASSESSMENT:  Gastrointestinal bleeding, probably upper, in a patient on both  aspirin and Daypro.   PLAN:  The risks, benefits and methods of endoscopy were discussed.  We  compared it to a colonoscopy.  It was discussed with both the patient and  his wife and will proceed this afternoon with further workup and plans  pending those findings.                                              Domingo Sep   MM/MEDQ  D:  12/28/2001  T:  12/30/2001  Job:  [id number]

## 2010-09-21 NOTE — Discharge Summary (Signed)
   NAME:  Connor [name], Connor [name] NO.:  [id number]   MEDICAL RECORD NO.:  [id number]                    PATIENT TYPE:   LOCATION:                                       FACILITY:   PHYSICIAN:  Boston Service, M.D.             DATE OF BIRTH:   DATE OF ADMISSION:  06/29/2002  DATE OF DISCHARGE:  07/02/2002                                 DISCHARGE SUMMARY   IDENTIFYING INFORMATION:  Indications, medications, allergies, tobacco,  EtOH, Past Medical History, Social History, Physical Exam, and Review of  Systems were all outlined in the Admitting Note.   HOSPITAL COURSE:  The patient was admitted June 29, 2002, cystoscopy,  TURP.  The patient had a pleasantly uneventful postoperative recovery.  Ambulatory on a regular diet by postop day #1.  CBI was continued, IV was  tapered by postop day #3.  Pathology report had returned, BPH, urine was  clear, Foley catheter was removed early on the morning of July 03, 2002.  The patient voiding pink urine without irritative symptoms.   DISCHARGE MEDICATIONS:  He was discharged home on Cipro, Pyridium, and  Vicodin.   FOLLOW UP:  Follow up office visit with Dr. Wanda Plump in about two weeks,  [phone], the patient will call sooner if there is any questions or  problems.                                               Boston Service, M.D.    RH/MEDQ  D:  07/20/2002  T:  07/20/2002  Job:  [id number]

## 2010-09-21 NOTE — Op Note (Signed)
NAME:  Connor [name], Connor [name]                          ACCOUNT NO.:  [id number]   MEDICAL RECORD NO.:  [id number]                   PATIENT TYPE:  INP   LOCATION:  0004                                 FACILITY:  [facility]   PHYSICIAN:  Boston Service, M.D.             DATE OF BIRTH:  [birth year]   DATE OF PROCEDURE:  06/29/2002  DATE OF DISCHARGE:                                 OPERATIVE REPORT   PREOPERATIVE DIAGNOSES:  1. Benign prostatic hypertrophy.  PSA 1.1.  2. Obstructive uropathy.  Postvoid residual 210 mL with two episodes of     acute cystitis in the last two years.   POSTOPERATIVE DIAGNOSES:  1. Benign prostatic hypertrophy.  PSA 1.1.  2. Obstructive uropathy.  Postvoid residual 210 mL with two episodes of     acute cystitis in the last two years.   PROCEDURE:  1. Cystoscopy under anesthesia.  2. Transurethral resection of the prostate.  3. Transurethral resection VaporTrode.   ANESTHESIA:  Spinal.   DRAINS:  24 French Foley.   DESCRIPTION OF PROCEDURE:  The patient was prepped and draped in the dorsal  lithotomy position after institution of adequate level of spinal anesthesia.  Care was taken in positioning the patient due to right and left total knee.  Urethra was dilated with sequential Sissy Hoff sounds.  The 5 French  continuous-flow resectoscope sheath was then gently inserted.  Normal  urethra.  Short, tight prostatic urethra with obstructive median lobe,  trabeculated bladder, clear efflux right and left orifice.  No other  intravesical pathology identified.  Loop was then used to create a single  furrow at the 6 o'clock position to allow free efflux of chips.  Resection  was then begun at the 10 o'clock position on the right lobe and carried down  to the 6 o'clock position.  Begun again at the 2 o'clock position on the  left lobe and carried down in the 6 o'clock position.  Small amount of  tissue was resected anteriorly.  No capsular perforations were noted.   Once  all resectable tissue had been removed, care was taken to irrigate all chips  free from the bladder.  Loop was replaced with the VaporTrode element which  was used to establish adequate hemostasis.  A small shelf of tissue had been  created at the bladder neck which was lightly incised with the VaporTrode  element at the 5 and 7 o'clock position, taking care to avoid injury to the  prostatic capsule, trigone, or distal orifices.  Once  adequate hemostasis had been obtained, VaporTrode element was withdrawn.  The patient was given a B&O suppository; 24 French three-way Foley catheter  was inserted and left to light traction and patient was returned to recovery  in satisfactory condition.  Boston Service, M.D.    RH/MEDQ  D:  06/29/2002  T:  06/29/2002  Job:  [id number]   cc:   Reather Littler, M.D.  [address]  [place]  [place] [address]  Fax: [phone]

## 2010-09-21 NOTE — H&P (Signed)
NAME:  BOY, DELAMATER NO.:  [id number]   MEDICAL RECORD NO.:  [id number]                   PATIENT TYPE:  INP   LOCATION:  0004                                 FACILITY:  [facility]   PHYSICIAN:  Boston Service, M.D.             DATE OF BIRTH:  Aug 19, 1933   DATE OF ADMISSION:  06/29/2002  DATE OF DISCHARGE:                                HISTORY & PHYSICAL   LOCAL MEDICAL DOCTOR:  Reather Littler, M.D.   UROLOGIST:  Boston Service, M.D.   PREOPERATIVE DIAGNOSIS:  A 77 year old male, obstructive uropathy, BPH,  elevated post void residual.  Symptoms persist despite alpha blockade.   HISTORY OF PRESENT ILLNESS:  1. In 1999 - BUN 16, creatinine 0.7, PSA 0.7.  Prescription Cardura and     Flomax.  IVP:  13.6 cm left kidney, 13.4 cm right kidney.  Cysto showed     elevated median lobe.  2. In 2001 - PSA 1.8.  Prescription for an enterococcal UTI.  Urine at that     time showed no red cells.  3. In 2003 - Repeat enterococcal UTI.  PVR at that time 370 mL.  PSA 1.1.     Ultrasound:  11.4 cm right kidney, 11.1 cm left kidney; simple cyst;     otherwise normal upper tracts.  4. In 2004 - Despite increasing doses of alpha blockade therapy PVR remained     about 210 mL.  Discussed with the patient the various options available     to [place].  To the best of my ability discussed the risks and benefits as     well as alternatives to TURP.  The patient understands and agrees to     proceed.   MEDICATIONS:  Lipitor, Protonix, Cardura, bisoprolol.   ALLERGIES:  PENICILLIN.   HABITS:  Tobacco:  Quit in 1980.  ETOH:  Occasional.   PAST MEDICAL HISTORY:  1. Right total knee - 1994.  2. Left total knee - 1997.   FAMILY HISTORY:  Positive for ASCAD and COPD.  A 44 year old wife, two  healthy children.   PHYSICAL EXAMINATION AND REVIEW OF SYSTEMS:  GENERAL:  A 77 year old white  male.  VITAL SIGNS:  Blood pressure 144/87, pulse 83.  Weight today 207.  HEENT AND  NECK:  Negative adenopathy, negative bruit.  LUNGS:  Clear to P&A.  HEART:  Regular rate and rhythm without murmur or gallop.  ABDOMEN:  Positive bowel sounds, soft.  GENITOURINARY:  Bilaterally descended testes, non-nodular prostate.  NEUROLOGIC:  Grossly intact.    LABORATORY AND X-RAY STUDIES:  Pending at the time of this dictation.   PLAN:  Will proceed later this a.m. with cystoscopy under anesthesia, TURP,  TUR VaporTrode.  Boston Service, M.D.    RH/MEDQ  D:  06/29/2002  T:  06/29/2002  Job:  [id number]

## 2010-09-21 NOTE — H&P (Signed)
NAME:  NHAT, HEARNE NO.:  [id number]   MEDICAL RECORD NO.:  [id number]          PATIENT TYPE:  INP   LOCATION:  1827                         FACILITY:  [facility]   PHYSICIAN:  Cassell Clement, M.D. DATE OF BIRTH:  [birth year]   DATE OF ADMISSION:  07/11/2004  DATE OF DISCHARGE:                                HISTORY & PHYSICAL   CHIEF COMPLAINT:  Recurrent atrial flutter.   HISTORY:  This is a 77 year old married Caucasian gentleman who was  hospitalized from July 04, 2004 through July 05, 2004 with paroxysmal atrial  flutter which resolved while he was still in the emergency room waiting for  a bed upstairs.  He had a negative cardiac work-up and was able to be  discharged after observation overnight the night morning, after enzymes  returned negative.  The patient's atrial flutter at that time was thought to  have been related to having drank some caffeinated [facility] earlier that  day.   The patient does not have any known history of coronary disease.  He had a  Cardiolite stress test July 15, 2003 which was normal.  He does have a past  history of hypertension and hypercholesterolemia.   The patient was discharged the morning after admission back in sinus rhythm  and had an echocardiogram in our office the same day on July 05, 2004 which  showed normal ejection fraction of 55-60% and there was evidence of impaired  diastolic relaxation.  He had a normal left atrial size and he had mild  aortic valve sclerosis with mild aortic insufficiency, and normal pulmonary  artery pressures.  The left atrial size was 34 mm, no thrombi were seen on  echo.  The patient was discharged to home on Metoprolol 25 mg b.i.d.,  Lipitor 10 mg daily, Ecotrin 81 mg daily and was also given Skelaxin for  muscle spasm, which he has not had to take since discharge.   Last evening at supper, he had about 2 glasses at wine. At approximately 10  p.m., he noted that his pulse was  more irregular, and he also noted that his  blood pressure was lower. He did not have any chest pain or dyspnea.  This  morning, his pulse was still irregular and he suspected that he was back in  atrial arrhythmia, and came into the office where his EKG confirmed the  presence of atrial flutter with a controlled ventricular response.   PAST MEDICAL HISTORY:  He is followed medically by Dr. Lucianne Muss. He was  hospitalized a year ago with a bleeding ulcer which did not require  transfusion.  He has a history of kidney stones. He has not had any recent  change in bowel habits, hematochezia or melena.  He has had occasional  dizziness associated with rapid heart action.   FAMILY HISTORY:  Positive family history of coronary disease.  His father  had a series of heart attacks beginning in his late 27s and succumbed to his  third or fourth myocardial infarction at age 55.  He also has a brother age  62 who  has atrial fibrillation. His mother died of a lung condition at age  23.   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   PAST SURGICAL HISTORY:  Bilateral total knee replacements by Dr. Hadassah Pais.  He had a TURP in 2004 by Dr. Wanda Plump for BPH.   REVIEW OF SYSTEMS:  The remainder of the review of systems is negative and  detailed.   ALLERGIES:  HE IS ALLERGIC TO PENICILLIN.   PHYSICAL EXAMINATION:  VITAL SIGNS:  Weight is 219, blood pressure 95/70,  pulse is 95 and irregularly-irregular.  GENERAL:  He is in no acute distress.  SKIN:  Warm and dry.  LUNGS:  Clear.  CARDIOVASCULAR:  No murmurs, rubs or gallops.  ABDOMEN:  Soft without hepatosplenomegaly or masses.  EXTREMITIES:  No phlebitis or edema.  Pedal pulses are good.   LABORATORY DATA:  EKG shows atrial flutter with ventricular response of 95.  There are no ischemic changes.   IMPRESSION:  1.  Recurrent atrial flutter.  2.  History of hyperlipidemia.  3.   History of essential hypertension.  4.  Remote history of bleeding ulcer.   DISPOSITION:  The patient was admitted to telemetry.  We are going to start  him on IV heparin and start him on IV amiodarone loading, and will also  start him on Coumadin protocol.      TB/MEDQ  D:  07/11/2004  T:  07/11/2004  Job:  [id number]   cc:   Reather Littler, M.D.  [address], [address]  [place]  [place] [address]  Fax: [phone]

## 2010-09-21 NOTE — Discharge Summary (Signed)
NAME:  Connor [name], Connor [name] NO.:  [id number]   MEDICAL RECORD NO.:  [id number]          PATIENT TYPE:  INP   LOCATION:  3742                         FACILITY:  [facility]   PHYSICIAN:  Cassell Clement, M.D. DATE OF BIRTH:  Aug 26, 1933   DATE OF ADMISSION:  07/04/2004  DATE OF DISCHARGE:  07/05/2004                                 DISCHARGE SUMMARY   FINAL DIAGNOSES:  1.  Atrial flutter, resolved  2.  Chest pressure, resolved.  3.  History of hypercholesterolemia.  4.  History of hypertension.   OPERATIONS PERFORMED:  None.   HISTORY:  This is a 77 year old gentleman admitted with chest discomfort and  new onset of atrial flutter. He has no prior history of known atrial  arrhythmia. He had a Cardiolite stress test July 15, 2003 which was normal.  He has a past history of hypertension and hypercholesterolemia. He has been  on Lipitor and had been on Ziac 5 mg daily until a month ago when he was  switched to lisinopril. He does not get any regular exercise but is active  with yard work. He has not smoked for 30 years. He has about one glass of  wine per night, and he does drink some caffeine and had drunk a diet  [facility] the afternoon prior to the onset of his symptoms. Last evening,  he developed a fluttering in his chest which has subsided, and then this  morning on the day of admission, the tachycardia resumed, and he went to see  Dr. Lucianne Muss who documented a regular supraventricular tachycardia and referred  to the emergency room.   PHYSICAL EXAMINATION:  VITAL SIGNS:  On physical exam, his blood pressure on  arrival was 117/72, pulse was 146 in atrial flutter. He was given IV  Cardizem, and very shortly after starting the IV Cardizem, blood pressure  was 120/73 and his pulse had dropped to rhythm and was 75 per minute. The  exam was unremarkable.  CARDIAC:  The cardiac exam shows no murmur, gallop, rub, or click.  ABDOMEN:  The abdomen is soft and nontender. The  extremities showed no  phlebitis or edema.   Initial EKG showed atrial flutter with intermittent right bundle branch  block, and his follow-up EKG showed normal sinus rhythm and was within  normal limits. Chest x-ray showed no active disease.   HOSPITAL COURSE:  The patient was admitted on observation status. His  potassium on admission was 3.6, and he was given extra potassium. Serial  enzymes were obtained and showed no evidence of myocardial infarction. EKG  the following morning was normal. The patient had thyroid function studies  which were normal.   The patient was able to be discharged the morning after admission back in  sinus rhythm.   DISCHARGE MEDICATIONS:  Discharge medications are as follows:  1.  Metoprolol 25 mg twice a day.  2.  Lipitor 10 mg daily.  3.  Ecotrin 81 mg daily.  4.  Skelaxin 800 mg 3 times a day as needed for muscle spasm of the left  shoulder secondary to a recent fall one week earlier.  5.  He will also use Tylenol as needed for pain.   ACTIVITIES:  To be quiet activity at home for the next several days.   DIET:  He will be on low cholesterol, no added salt diet, and he is to avoid  caffeine including caffeine in cola drinks. For the time being, we will stop  his lisinopril although we may be able to restart it later. We are going to  arrange for _______________ echocardiogram in our office, and I have asked  him to return in 1 week for a follow-up office visit.      TB/MEDQ  D:  07/05/2004  T:  07/05/2004  Job:  [id number]   cc:   Reather Littler, M.D.  [address]  [place]  [place] [address]  Fax: [phone]

## 2010-09-25 ENCOUNTER — Ambulatory Visit (HOSPITAL_COMMUNITY)
Admission: RE | Admit: 2010-09-25 | Discharge: 2010-09-26 | Disposition: A | Discharge status: Home / Self Care | Payer: Medicare Other | Source: Ambulatory Visit | Attending: Internal Medicine | Admitting: Internal Medicine

## 2010-09-25 DIAGNOSIS — N400 Enlarged prostate without lower urinary tract symptoms: Secondary | ICD-10-CM | POA: Insufficient documentation

## 2010-09-25 DIAGNOSIS — D649 Anemia, unspecified: Secondary | ICD-10-CM | POA: Insufficient documentation

## 2010-09-25 DIAGNOSIS — I4510 Unspecified right bundle-branch block: Secondary | ICD-10-CM | POA: Insufficient documentation

## 2010-09-25 DIAGNOSIS — I4891 Unspecified atrial fibrillation: Secondary | ICD-10-CM

## 2010-09-25 DIAGNOSIS — I4892 Unspecified atrial flutter: Secondary | ICD-10-CM | POA: Insufficient documentation

## 2010-09-25 DIAGNOSIS — Z7901 Long term (current) use of anticoagulants: Secondary | ICD-10-CM | POA: Insufficient documentation

## 2010-09-25 DIAGNOSIS — E559 Vitamin D deficiency, unspecified: Secondary | ICD-10-CM | POA: Insufficient documentation

## 2010-09-25 DIAGNOSIS — E785 Hyperlipidemia, unspecified: Secondary | ICD-10-CM | POA: Insufficient documentation

## 2010-09-25 DIAGNOSIS — Z8711 Personal history of peptic ulcer disease: Secondary | ICD-10-CM | POA: Insufficient documentation

## 2010-09-25 DIAGNOSIS — I10 Essential (primary) hypertension: Secondary | ICD-10-CM | POA: Insufficient documentation

## 2010-09-25 DIAGNOSIS — Z79899 Other long term (current) drug therapy: Secondary | ICD-10-CM | POA: Insufficient documentation

## 2010-09-25 LAB — PROTIME-INR
INR: 1.73 — ABNORMAL HIGH (ref 0.00–1.49)
Prothrombin Time: 20.4 seconds — ABNORMAL HIGH (ref 11.6–15.2)

## 2010-09-26 LAB — BASIC METABOLIC PANEL
Calcium: 8.6 mg/dL (ref 8.4–10.5)
Creatinine, Ser: 0.75 mg/dL (ref 0.4–1.5)
GFR calc non Af Amer: >60 mL/min (ref >60)
Glucose, Bld: 95 mg/dL (ref 70–99)
Sodium: 142 mEq/L (ref 135–145)

## 2010-09-26 LAB — PROTIME-INR: Prothrombin Time: 20.4 seconds — ABNORMAL HIGH (ref 11.6–15.2)

## 2010-09-28 ENCOUNTER — Telehealth: Payer: Self-pay | Admitting: Internal Medicine

## 2010-09-28 ENCOUNTER — Ambulatory Visit (INDEPENDENT_AMBULATORY_CARE_PROVIDER_SITE_OTHER): Payer: Medicare Other

## 2010-09-28 DIAGNOSIS — R0989 Other specified symptoms and signs involving the circulatory and respiratory systems: Secondary | ICD-10-CM

## 2010-09-28 NOTE — Telephone Encounter (Signed)
I spoke with the pt and his left groin was used for 09/25/10 EP study and Ablation.  The pt does c/o some swelling and bruising at area.  The pt said that the bruising is "spotty along the groin area". The biggest bruise is about an inch and a half wide and the length of a tennis ball.  The pt also said the groin area is "puffy".  The pt denies any hard areas at groin site.  The pt denies any pain, bleeding or drainage at site. I made the pt aware that he could come into the office today for a groin check and he will come in around 12:30.

## 2010-09-28 NOTE — Telephone Encounter (Signed)
The pt came into the office and left groin assessed.  The pt does have bruising from the outside hip area down into the thigh area.  The bruise is purple and yellow.  The pt does have a small hematoma the size of a nickel at the puncture site.  No bruit present.  The pt denies pain when the groin area is pressed.  No drainage.  I did have Dr Tenny Craw also look at the groin and no further testing recommended at this time.

## 2010-09-28 NOTE — Telephone Encounter (Signed)
Pt having swelling  around groin area. Pt wants to talk to a nurse.

## 2010-09-28 NOTE — Op Note (Signed)
NAME:  Connor [name], Connor [name] NO.:  [id number]  MEDICAL RECORD NO.:  [id number]           PATIENT TYPE:  O  LOCATION:  3707                         FACILITY:  [facility]  PHYSICIAN:  Hillis Range, MD       DATE OF BIRTH:  Sep 29, 1933  DATE OF PROCEDURE: DATE OF DISCHARGE:                              OPERATIVE REPORT   SURGEON:  Hillis Range, MD  PREPROCEDURE DIAGNOSIS:  Typical appearing atrial flutter.  POSTPROCEDURE DIAGNOSES:  Typical appearing atrial flutter.  PROCEDURES: 1. Comprehensive EP study. 2. Coronary sinus pacing and recording. 3. Mapping of SVT. 4. Radiofrequency ablation of SVT. 5. Cardioversion. 6. Ibutilide infusion.  INTRODUCTION:  Connor [name] is a pleasant 77 year old gentleman with a history of symptomatic recurrent typical appearing atrial flutter who presents today for EP study and radiofrequency ablation.  He has had multiple episodes of atrial flutter for which he has previously been maintained on amiodarone and Coumadin.  Most recently, he presented in February with 2:1 typical appearing atrial flutter requiring cardioversion.  He now presents for EP study and radiofrequency ablation.  DESCRIPTION OF PROCEDURE:  Informed written consent was obtained and the patient was brought to the electrophysiology lab in the fasting state. He was adequately sedated with intravenous Versed and fentanyl as outlined in the nursing report.  The patient's left groin was prepped and draped in the usual sterile fashion by the EP lab staff.  Using a percutaneous Seldinger technique, two 6-French and one 8-French hemostasis sheaths were placed into the left common femoral vein.  A 6- French decapolar Polaris X catheter was introduced through the left common femoral vein and advanced into the coronary sinus for recording and pacing from this location.  A 6-French quadripolar Josephson catheter was introduced through the left common femoral vein and advanced  into the right ventricle for recording and pacing.  This catheter was then pulled back to the His bundle location.  The patient presented to the electrophysiology lab in sinus rhythm with a right bundle-branch QRS morphology.  His PR interval was 167 milliseconds with a QRS duration of 154 milliseconds and a QT interval 458 milliseconds. His RR intervals 818 milliseconds.  His AH interval measured 71 milliseconds with an HV interval of 56 milliseconds.  Ventricular pacing was performed which revealed VA dissociation at baseline.  Atrial pacing was then performed which revealed an AV Wenckebach cycle length of 470 milliseconds.  There was no evidence of PR greater than RR.  Atrial pacing was continued down to a cycle length of 220 milliseconds at which time the patient had inducible atrial flutter.  The surface EKG was consistent with typical appearing atrial flutter with positive flutter waves in the lead V1 and negative flutter waves in the inferior leads. Entrainment was performed from the left atrium which revealed a long post pacing interval.  The tachycardia was spontaneously converted into atrial fibrillation with entrainment mapping from the cavotricuspid isthmus.  Atrial fibrillation spontaneously terminated.  Attempts to induce atrial flutter were again made, however, the patient degenerated into atrial fibrillation with atrial pacing at 600 milliseconds.  As the patient clinically  has known typical appearing atrial flutter and has not had a history of documented atrial fibrillation, I felt that was most prudent today to proceed with cavotricuspid isthmus ablation.  A 7- [place] [employer] II XP 8-mm ablation catheter was therefore introduced through the left common femoral vein into the right atrium.  Mapping was performed along the usual cavotricuspid isthmus. The ablation catheter was placed along the isthmus and a series of radiofrequency applications were  delivered with a target temperature of 60 degrees at 50 watts for 120 seconds.  A total of 4 radiofrequency applications were delivered.  Following extensive ablation along the cavotricuspid isthmus, the patient was successfully cardioverted to sinus rhythm with a single synchronized 360-joule cardioversion shock with cardioversion electrodes in the anterior and posterior thoracic configuration.  The patient converted to sinus rhythm, but then quickly returned to atrial fibrillation.  A second 360-joule shock was therefore delivered and the patient was again cardioverted to sinus rhythm. Atrial pacing was then performed, however, before I could perform differential atrial pacing.  The patient again degenerated into atrial fibrillation.  I therefore elected to infuse ibutilide.  A baseline QT interval was measured at 458 milliseconds.  A 1 mg of ibutilide was infused over 15 minutes with a serial QT interval measurements obtained without significant QT prolongation observed.  After a 15-minute waiting period, the patient was again cardioverted to sinus rhythm with a 360- joule shock.  Atrial pacing was then performed and the ablation catheter was positioned on the free wall side of the isthmus line.  A stimulus to earliest atrial activation recorded across the isthmus was observed to measure 189 milliseconds.  It was therefore felt that the patient likely had isthmus block.  A 7-French duodecapolar Halo catheter was introduced through the left common femoral vein and advanced into the right atrium. This catheter was then positioned around the cavotricuspid isthmus. Differential atrial pacing from the low lateral right atrium confirmed unidirectional isthmus block with a stimulus to earliest atrial activation recording 190 milliseconds.  Unfortunately, prior to measuring bidirectional block by pacing from the free wall of the right atrium, the patient again degenerated into atrial  fibrillation.  It was felt that he likely had bidirectional isthmus block and that further cardioversion attempts would provide unnecessary risk for the patient at this time.  I therefore elected to conclude the procedure.  I think that cavotricuspid isthmus block has been achieved.  As ibutilide has been infused, hopefully, the patient will spontaneously convert to sinus rhythm.  We will monitor him closely on the floor with serial QT measurements overnight.  The procedure was therefore considered completed.  All catheters were removed and sheaths were aspirated and flushed.  The sheaths were removed and hemostasis was assured.  There were no early apparent complications.  CONCLUSIONS: 1. Sinus rhythm with a right bundle-branch block QRS morphology upon     presentation. 2. Inducible right atrial flutter which degenerated into atrial     fibrillation making mapping today quite difficult.  The patient     clinically has had typical right atrial flutter and I therefore     elected to perform cavotricuspid isthmus ablation. 3. Cavotricuspid isthmus ablation was performed with isthmus block     achieved. 4. Incessant atrial fibrillation during and post ablation requiring     cardioversion as well as ibutilide infusion. 5. No early apparent complications.     Hillis Range, MD    JA/MEDQ  D:  09/25/2010  T:  09/26/2010  Job:  [id number]  cc:   Cassell Clement, M.D.  Electronically Signed by Hillis Range MD on 09/28/2010 05:55:46 PM

## 2010-10-03 ENCOUNTER — Ambulatory Visit (INDEPENDENT_AMBULATORY_CARE_PROVIDER_SITE_OTHER): Payer: Medicare Other | Admitting: *Deleted

## 2010-10-03 DIAGNOSIS — I4891 Unspecified atrial fibrillation: Secondary | ICD-10-CM

## 2010-10-08 ENCOUNTER — Other Ambulatory Visit: Payer: Self-pay | Admitting: Cardiology

## 2010-10-08 DIAGNOSIS — I4891 Unspecified atrial fibrillation: Secondary | ICD-10-CM

## 2010-10-08 NOTE — Telephone Encounter (Signed)
escribe request  

## 2010-10-12 ENCOUNTER — Ambulatory Visit (INDEPENDENT_AMBULATORY_CARE_PROVIDER_SITE_OTHER): Payer: Medicare Other | Admitting: *Deleted

## 2010-10-12 DIAGNOSIS — I4891 Unspecified atrial fibrillation: Secondary | ICD-10-CM

## 2010-10-12 LAB — POCT INR: INR: 2.5
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
## 2010-10-29 ENCOUNTER — Telehealth: Payer: Self-pay | Admitting: Cardiology

## 2010-10-29 ENCOUNTER — Ambulatory Visit (INDEPENDENT_AMBULATORY_CARE_PROVIDER_SITE_OTHER): Payer: Medicare Other | Admitting: *Deleted

## 2010-10-29 DIAGNOSIS — I4891 Unspecified atrial fibrillation: Secondary | ICD-10-CM

## 2010-10-31 ENCOUNTER — Ambulatory Visit (INDEPENDENT_AMBULATORY_CARE_PROVIDER_SITE_OTHER): Payer: Medicare Other | Admitting: Internal Medicine

## 2010-10-31 ENCOUNTER — Encounter: Payer: Self-pay | Admitting: Internal Medicine

## 2010-10-31 DIAGNOSIS — I4892 Unspecified atrial flutter: Secondary | ICD-10-CM

## 2010-10-31 DIAGNOSIS — I10 Essential (primary) hypertension: Secondary | ICD-10-CM

## 2010-10-31 NOTE — Assessment & Plan Note (Signed)
Doing well s/p ablation without recurrence We will stop diltiazem today.  Due to DVT 2/12, we will continue coumadin x 4 more weeks.  At that point, he will have received 6 months treatment for his DVT and coumadin could be stopped.  If he develops afib down the road, he would again require coumadin at that time.

## 2010-10-31 NOTE — Patient Instructions (Signed)
Your physician recommends that you schedule a follow-up appointment with Dr Patty Sermons as scheduled  Continue Coumadin for one more month then stop  Your physician has recommended you make the following change in your medication:STOP diltiazem today  Watch the SALT in your diet  Continue to monitor your Blood Pressure  If goes up let Dr Patty Sermons know

## 2010-10-31 NOTE — Assessment & Plan Note (Signed)
Stable No change required today  He will follow his BP closely and contact Dr Patty Sermons if elevated.

## 2010-10-31 NOTE — Progress Notes (Signed)
The patient presents today for routine electrophysiology followup.  Since his recent atrial flutter ablation, the patient reports doing very well.   He denies procedure related complications.  He has had no symptoms of arrhythmia.  Today, he denies symptoms of palpitations, chest pain, shortness of breath, orthopnea, PND, lower extremity edema, dizziness, presyncope, syncope, or neurologic sequela.  The patient feels that he is tolerating medications without difficulties and is otherwise without complaint today.   Past Medical History  Diagnosis Date  . Hypertension   . Atrial flutter   . Hyperlipidemia   . Vitamin D deficiency   . RBBB (right bundle branch block)   . BPH (benign prostatic hyperplasia)   . Osteoarthritis   . Anemia   . Peptic ulcer disease   . DVT (deep venous thrombosis) 2/12    in setting of R hip surgery, he developed R femoral DVT   Past Surgical History  Procedure Date  . Total hip arthroplasty 06/04/10    RIGHT HIP  . Total knee arthroplasty   . Transurethral resection of prostate   . Atrial flutter ablation 5/12    by JA    Current Outpatient Prescriptions  Medication Sig Dispense Refill  . Ascorbic Acid (VITAMIN C PO) Take 1 tablet by mouth daily.        Marland Kitchen atorvastatin (LIPITOR) 10 MG tablet Take 10 mg by mouth daily.        . Calcium Carbonate (CALCIUM 500 PO) 1 tab po qd       . Cholecalciferol (VITAMIN D3) 5000 UNITS TABS Take 5,000 Units by mouth daily.        [name] Calcium (STOOL SOFTENER PO) Take by mouth as needed.        . metoprolol tartrate (LOPRESSOR) 25 MG tablet Take 25 mg by mouth 2 (two) times daily.        . Multiple Vitamin (MULTIVITAMIN) tablet Take 1 tablet by mouth daily.        . Multiple Vitamins-Minerals (ICAPS PO) Take by mouth daily.        Marland Kitchen omeprazole (PRILOSEC) 20 MG capsule Take 20 mg by mouth daily.        . Tamsulosin HCl (FLOMAX) 0.4 MG CAPS Take 0.4 mg by mouth daily.        Marland Kitchen DISCONTD: diltiazem (CARDIZEM CD) 240 MG  24 hr capsule TAKE 1 CAPSULE BY MOUTH EVERY DAY  30 capsule  11  . DISCONTD: warfarin (COUMADIN) 5 MG tablet Take 5 mg by mouth as directed. 5 MG x 6 DAYS A WEEK, NO COUMADIN 1 DAY A WEEK       . DISCONTD: amiodarone (PACERONE) 200 MG tablet Take 200 mg by mouth daily.          Allergies  Allergen Reactions  . Penicillins     History   Social History  . Marital Status: Married    Spouse Name: N/A    Number of Children: N/A  . Years of Education: N/A   Occupational History  . Not on file.   Social History Main Topics  . Smoking status: Former Smoker    Types: Cigarettes    Quit date: 05/06/1978  . Smokeless tobacco: Never Used  . Alcohol Use: No  . Drug Use: No  . Sexually Active: Not on file   Other Topics Concern  . Not on file   Social History Narrative   he patient lives in [place] with his spouse    Family History  Problem Relation Age of Onset  . Heart attack Father   . Lung disease Mother   . Atrial fibrillation Brother    Physical Exam: Filed Vitals:   10/31/10 1208  BP: 133/79  Pulse: 62  Resp: 14  Height: 5\' 11"  (1.803 m)  Weight: 216 lb (97.977 kg)    GEN- The patient is well appearing, alert and oriented x 3 today.   Head- normocephalic, atraumatic Eyes-  Sclera clear, conjunctiva pink Ears- hearing intact Oropharynx- clear Neck- supple, no JVP Lymph- no cervical lymphadenopathy Lungs- Clear to ausculation bilaterally, normal work of breathing Heart- Regular rate and rhythm, no murmurs, rubs or gallops, PMI not laterally displaced GI- soft, NT, ND, + BS Extremities- no clubbing, cyanosis, 1+ BLE edema MS- multiple joint deformities, s/p multiple surgeries Skin- no rash or lesion Psych- euthymic mood, full affect Neuro- strength and sensation are intact  ekg today reveals sinus rhythm 62 bpm, RBBB  Assessment and Plan:

## 2010-11-05 ENCOUNTER — Telehealth: Payer: Self-pay | Admitting: Cardiology

## 2010-11-05 NOTE — Telephone Encounter (Signed)
Patient needs appointment for July.  Patient is out of town.  Just saw dr. Johney Frame, had an ablasion.

## 2010-11-05 NOTE — Telephone Encounter (Signed)
Scheduled appointment

## 2010-11-26 ENCOUNTER — Ambulatory Visit (INDEPENDENT_AMBULATORY_CARE_PROVIDER_SITE_OTHER): Payer: Medicare Other | Admitting: *Deleted

## 2010-11-26 DIAGNOSIS — I4891 Unspecified atrial fibrillation: Secondary | ICD-10-CM

## 2010-12-03 NOTE — Telephone Encounter (Signed)
No note required for this encounter.

## 2010-12-04 NOTE — Telephone Encounter (Signed)
No note necessary for this encounter. °

## 2010-12-07 ENCOUNTER — Encounter: Payer: Self-pay | Admitting: Cardiology

## 2010-12-07 ENCOUNTER — Ambulatory Visit (INDEPENDENT_AMBULATORY_CARE_PROVIDER_SITE_OTHER): Payer: Medicare Other | Admitting: *Deleted

## 2010-12-07 ENCOUNTER — Ambulatory Visit (INDEPENDENT_AMBULATORY_CARE_PROVIDER_SITE_OTHER): Payer: Medicare Other | Admitting: Cardiology

## 2010-12-07 VITALS — BP 120/80 | HR 67 | Wt 217.0 lb

## 2010-12-07 DIAGNOSIS — I10 Essential (primary) hypertension: Secondary | ICD-10-CM

## 2010-12-07 DIAGNOSIS — K219 Gastro-esophageal reflux disease without esophagitis: Secondary | ICD-10-CM

## 2010-12-07 DIAGNOSIS — I4892 Unspecified atrial flutter: Secondary | ICD-10-CM

## 2010-12-07 DIAGNOSIS — I4891 Unspecified atrial fibrillation: Secondary | ICD-10-CM

## 2010-12-07 DIAGNOSIS — R002 Palpitations: Secondary | ICD-10-CM

## 2010-12-07 LAB — POCT INR: INR: 1.9

## 2010-12-07 MED ORDER — ASPIRIN 81 MG PO CHEW: 81.0000 mg | CHEWABLE_TABLET | Freq: Every day | ORAL | Status: DC

## 2010-12-07 NOTE — Assessment & Plan Note (Signed)
The patient denies any recent symptoms of dyspepsia or GERD.  He has not had any evidence of hematochezia or melena.

## 2010-12-07 NOTE — Assessment & Plan Note (Signed)
The patient has been checking his blood pressure daily at home.  Generally has been good.  Occasionally   The blood pressure  Will run slightly high usually secondary to dietary indiscretion regarding salt.

## 2010-12-07 NOTE — Progress Notes (Signed)
Connor [name] Date of Birth:  [birth year] [facility] [address]   [address] [place], [place]  [address] [phone]           Fax   [phone]  History of Present Illness: This pleasant 77 year old Caucasian gentleman is seen for followup office visit.  He has a past history of recurrent atrial flutter and fibrillation.  On Sep 25, 2010 the patient underwent a flutter ablation by Dr. Johney Frame.  Since that time he has done well.  He has had no recurrence of his atrial fibrillation or flutter.  He has had some occasional PVCs which she has noted when he tries to take his blood pressure on his machine at home and it registers and arrhythmia error.  He has not been experiencing any chest pain or shortness of breath.  His had no dizziness or syncope.  He has been on warfarin up until now.  He has a history of a known right bundle-branch block which is asymptomatic.  He had a normal one-day treadmill Cardiolite stress test on 07/15/03.  His last echocardiogram was 12/04/09 and showed normal ejection fraction of 55-60% and biatrial enlargement and mild aortic sclerosis with mild aortic insufficiency and normal pulmonary artery pressure.  Current Outpatient Prescriptions  Medication Sig Dispense Refill  . Ascorbic Acid (VITAMIN C PO) Take 1 tablet by mouth daily.        Marland Kitchen atorvastatin (LIPITOR) 10 MG tablet Take 10 mg by mouth daily.        . Calcium Carbonate (CALCIUM 500 PO) 1 tab po qd       . Cholecalciferol (VITAMIN D3) 5000 UNITS TABS Take 5,000 Units by mouth daily.        [name] Calcium (STOOL SOFTENER PO) Take by mouth as needed.        . metoprolol tartrate (LOPRESSOR) 25 MG tablet Take 25 mg by mouth 2 (two) times daily.        . Multiple Vitamin (MULTIVITAMIN) tablet Take 1 tablet by mouth daily.        . Multiple Vitamins-Minerals (ICAPS PO) Take by mouth daily.        Marland Kitchen omeprazole (PRILOSEC) 20 MG capsule Take 20 mg by mouth daily.        . Tamsulosin  HCl (FLOMAX) 0.4 MG CAPS Take 0.4 mg by mouth daily.        Marland Kitchen aspirin (ASPIRIN CHILDRENS) 81 MG chewable tablet Chew 1 tablet (81 mg total) by mouth daily.  36 tablet  11    Allergies  Allergen Reactions  . Penicillins     Patient Active Problem List  Diagnoses  . Atrial flutter  . Hyperlipidemia  . Hypertension  . Osteoarthritis  . BPH (benign prostatic hyperplasia)  . GERD (gastroesophageal reflux disease)    History  Smoking status  . Former Smoker  . Types: Cigarettes  . Quit date: 05/06/1978  Smokeless tobacco  . Never Used    History  Alcohol Use No    Family History  Problem Relation Age of Onset  . Heart attack Father   . Lung disease Mother   . Atrial fibrillation Brother     Review of Systems: Constitutional: no fever chills diaphoresis or fatigue or change in weight.  Head and neck: no hearing loss, no epistaxis, no photophobia or visual disturbance. Respiratory: No cough, shortness of breath or wheezing. Cardiovascular: No chest pain peripheral edema, palpitations. Gastrointestinal: No abdominal distention, no abdominal pain, no  change in bowel habits hematochezia or melena. Genitourinary: No dysuria, no frequency, no urgency, no nocturia. Musculoskeletal:No arthralgias, no back pain, no gait disturbance or myalgias. Neurological: No dizziness, no headaches, no numbness, no seizures, no syncope, no weakness, no tremors. Hematologic: No lymphadenopathy, no easy bruising. Psychiatric: No confusion, no hallucinations, no sleep disturbance.    Physical Exam: Filed Vitals:   12/07/10 1152  BP: 120/80  Pulse: 67    The general appearance reveals a well-developed well-nourished gentleman in no distress.The head and neck exam reveals pupils equal and reactive.  Extraocular movements are full.  There is no scleral icterus.  The mouth and pharynx are normal.  The neck is supple.  The carotids reveal no bruits.  The jugular venous pressure is normal.  The   thyroid is not enlarged.  There is no lymphadenopathy.  The chest is clear to percussion and auscultation.  There are no rales or rhonchi.  Expansion of the chest is symmetrical.  The precordium is quiet.  The first heart sound is normal.  The second heart sound is physiologically split.  There is no murmur gallop rub or click.  There is no abnormal lift or heave.  The abdomen is soft and nontender.  The bowel sounds are normal.  The liver and spleen are not enlarged.  There are no abdominal masses.  There are no abdominal bruits.  Extremities reveal good pedal pulses.  There is no phlebitis or edema.  There is no cyanosis or clubbing.  Strength is normal and symmetrical in all extremities.  There is no lateralizing weakness.  There are no sensory deficits.  The skin is warm and dry.  There is no rash.    EKG today shows normal sinus rhythm with trigeminy PVC.  His underlying right bundle branch block is unchanged.   Assessment / Plan:   Stop warfarin now and switch to aspirin 81 mg one daily. We reviewed his PVCs.  They are asymptomatic and are benign.  He will cut down on caffeine in the form of iced tea.  He does limit his alcohol already. The patient will return in 3 months for followup office visit and EKG, or sooner p.r.n.

## 2010-12-07 NOTE — Patient Instructions (Signed)
Stop warfarin.  Start ASA 81 daily. Avoid cafffeine to decrease PVCs

## 2010-12-07 NOTE — Assessment & Plan Note (Signed)
The patient had successful ablation of his atrial flutter on Sep 25, 2010.  He's had no recurrent atrial flutter or fibrillation.

## 2010-12-24 ENCOUNTER — Encounter: Payer: Medicare Other | Admitting: *Deleted

## 2011-01-05 DIAGNOSIS — I480 Paroxysmal atrial fibrillation: Secondary | ICD-10-CM

## 2011-01-05 HISTORY — DX: Paroxysmal atrial fibrillation: I48.0

## 2011-01-10 ENCOUNTER — Telehealth: Payer: Self-pay | Admitting: Nurse Practitioner

## 2011-01-10 NOTE — Telephone Encounter (Signed)
Pt called this evening stating that for the past few hrs his heart rhythm has been irregular with rates in the low 100's.  He is asymptomatic.  He has a h/o aflutter and is s/p rfca and there's also a reported h/o a. Fib though pt. Was taken off of coumadin in early August.  I rec that he take is pm dose of BB tonight and call back in the am so that we can bring him into the office for an ecg and determine his rhythm.  If however, he becomes symptomatic tonight, then he should come into the ED.  He verbalized understanding.

## 2011-01-11 ENCOUNTER — Encounter: Payer: Self-pay | Admitting: Cardiology

## 2011-01-11 ENCOUNTER — Telehealth: Payer: Self-pay | Admitting: Cardiology

## 2011-01-11 ENCOUNTER — Ambulatory Visit (INDEPENDENT_AMBULATORY_CARE_PROVIDER_SITE_OTHER): Payer: Medicare Other | Admitting: Cardiology

## 2011-01-11 VITALS — BP 98/58 | HR 111 | Ht 72.0 in | Wt 213.0 lb

## 2011-01-11 DIAGNOSIS — I4892 Unspecified atrial flutter: Secondary | ICD-10-CM

## 2011-01-11 DIAGNOSIS — I4891 Unspecified atrial fibrillation: Secondary | ICD-10-CM

## 2011-01-11 MED ORDER — DILTIAZEM HCL ER COATED BEADS 240 MG PO CP24: 240.0000 mg | ORAL_CAPSULE | Freq: Every day | ORAL | Status: DC

## 2011-01-11 MED ORDER — RIVAROXABAN 20 MG PO TABS: 20.0000 mg | ORAL_TABLET | Freq: Every day | ORAL | Status: DC

## 2011-01-11 NOTE — Patient Instructions (Addendum)
We will start you on Xarelto 20 mg daily. Stop ASA.  Resume diltiazem 240 mg daily.  We will get a follow up with Dr Patty Sermons in 1 week.

## 2011-01-11 NOTE — Telephone Encounter (Signed)
Scheduled to see Dr [place] today

## 2011-01-11 NOTE — Telephone Encounter (Signed)
Pt calling c/o heart being out of rhythm and beating fast.  Rinaldo Cloud spoke with pt last night and advised pt to call office this AM and get appt to come in to see Dr. Patty Sermons and get a EKG this morning to determine cause of pt tachycardia and heart out of rhythm.

## 2011-01-12 DIAGNOSIS — I4819 Other persistent atrial fibrillation: Secondary | ICD-10-CM | POA: Insufficient documentation

## 2011-01-12 NOTE — Assessment & Plan Note (Signed)
He is status post ablation in May of this year.

## 2011-01-12 NOTE — Progress Notes (Signed)
Connor [name] Date of Birth:  [birth year] [facility] / [facility] [address]   [address] [place], [place]  [address] [phone]           Fax   [phone]  History of Present Illness: This pleasant 77 year old Caucasian gentleman is seen as a work in visit. He has a history of atrial flutter and had a cardioversion last January. He had recurrence and underwent an atrial flutter ablation at the end of May by Dr. Johney Frame. He has been off of antiarrhythmic drug therapy, diltiazem, and Coumadin. Yesterday he was driving back from [place] when he felt a weird sensation in his chest. He became mildly sweaty. He checked his pulse and could tell that he was out of rhythm. He feels somewhat hot and flushed. He's had no chest pain or shortness of breath. He has had some discomfort in his left shoulder and arm. On evaluation today he was found to be in atrial fibrillation with a rapid ventricular response.  Current Outpatient Prescriptions  Medication Sig Dispense Refill  . Ascorbic Acid (VITAMIN C PO) Take 1 tablet by mouth daily.        Marland Kitchen aspirin (ASPIRIN CHILDRENS) 81 MG chewable tablet Chew 1 tablet (81 mg total) by mouth daily.  36 tablet  11  . atorvastatin (LIPITOR) 10 MG tablet Take 10 mg by mouth daily.        . Calcium Carbonate (CALCIUM 500 PO) 1 tab po qd       . Cholecalciferol (VITAMIN D3) 5000 UNITS TABS Take 5,000 Units by mouth daily.        [name] Calcium (STOOL SOFTENER PO) Take by mouth as needed.        . metoprolol tartrate (LOPRESSOR) 25 MG tablet Take 25 mg by mouth 2 (two) times daily.        . Multiple Vitamin (MULTIVITAMIN) tablet Take 1 tablet by mouth daily.        . Multiple Vitamins-Minerals (ICAPS PO) Take by mouth daily.        Marland Kitchen omeprazole (PRILOSEC) 20 MG capsule Take 20 mg by mouth daily.        . Tamsulosin HCl (FLOMAX) 0.4 MG CAPS Take 0.4 mg by mouth daily.        Marland Kitchen diltiazem (CARDIZEM CD) 240 MG 24 hr capsule Take 1 capsule (240 mg  total) by mouth daily.  30 capsule  11  . Rivaroxaban (XARELTO) 20 MG TABS Take 20 mg by mouth daily.  30 tablet  6    Allergies  Allergen Reactions  . Penicillins     Patient Active Problem List  Diagnoses  . Atrial flutter  . Hyperlipidemia  . Hypertension  . Osteoarthritis  . BPH (benign prostatic hyperplasia)  . GERD (gastroesophageal reflux disease)    History  Smoking status  . Former Smoker  . Types: Cigarettes  . Quit date: 05/06/1978  Smokeless tobacco  . Never Used    History  Alcohol Use No    Family History  Problem Relation Age of Onset  . Heart attack Father   . Lung disease Mother   . Atrial fibrillation Brother   . Arrhythmia Sister     Review of Systems: Constitutional: no fever chills diaphoresis or fatigue or change in weight.  Head and neck: no hearing loss, no epistaxis, no photophobia or visual disturbance. Respiratory: No cough, shortness of breath or wheezing. Cardiovascular: No chest pain peripheral edema, palpitations. Gastrointestinal: No abdominal distention,  no abdominal pain, no change in bowel habits hematochezia or melena. Genitourinary: No dysuria, no frequency, no urgency, no nocturia. Musculoskeletal:No arthralgias, no back pain, no gait disturbance or myalgias. Neurological: No dizziness, no headaches, no numbness, no seizures, no syncope, no weakness, no tremors. Hematologic: No lymphadenopathy, no easy bruising. Psychiatric: No confusion, no hallucinations, no sleep disturbance.    Physical Exam: Filed Vitals:   01/11/11 1405  BP: 98/58  Pulse: 111    The general appearance reveals a well-developed well-nourished gentleman in no distress.The head and neck exam reveals pupils equal and reactive.  Extraocular movements are full.  There is no scleral icterus.  The mouth and pharynx are normal.  The neck is supple.  The carotids reveal no bruits.  The jugular venous pressure is normal.  The  thyroid is not enlarged.  There  is no lymphadenopathy.  The chest is clear to percussion and auscultation.  There are no rales or rhonchi.  Expansion of the chest is symmetrical.  The precordium is quiet. S1 and S2 are normal. Pulse is irregular.  There is no murmur gallop rub or click.  There is no abnormal lift or heave.  The abdomen is soft and nontender.  The bowel sounds are normal.  The liver and spleen are not enlarged.  There are no abdominal masses.  There are no abdominal bruits.  Extremities reveal good pedal pulses.  There is no phlebitis or edema.  There is no cyanosis or clubbing.  Strength is normal and symmetrical in all extremities.  There is no lateralizing weakness.  There are no sensory deficits.  The skin is warm and dry.  There is no rash.  ECG today shows atrial for relation with a ventricular response of 111 beats per minute.  His underlying right bundle branch block is unchanged.   Assessment / Plan:

## 2011-01-12 NOTE — Assessment & Plan Note (Signed)
Patient is now in atrial fibrillation with a rapid ventricular response. He is mildly symptomatic. Duration is 24 hours or less. We will start him on Xarelto 20 mg per day for anticoagulation. We will resume diltiazem 240 mg daily in addition to his metoprolol for rate control. He will follow up with Dr. Patty Sermons in one week and if he is still in atrial fibrillation we will consider him for a cardioversion. He may need referral back to Dr. Johney Frame to consider an atrial fibrillation ablation.

## 2011-01-14 ENCOUNTER — Encounter: Payer: Self-pay | Admitting: Cardiology

## 2011-01-18 ENCOUNTER — Ambulatory Visit (INDEPENDENT_AMBULATORY_CARE_PROVIDER_SITE_OTHER): Payer: Medicare Other | Admitting: Cardiology

## 2011-01-18 ENCOUNTER — Encounter: Payer: Self-pay | Admitting: Cardiology

## 2011-01-18 VITALS — BP 120/80 | HR 65 | Ht 72.0 in | Wt 213.4 lb

## 2011-01-18 DIAGNOSIS — E785 Hyperlipidemia, unspecified: Secondary | ICD-10-CM

## 2011-01-18 DIAGNOSIS — I4892 Unspecified atrial flutter: Secondary | ICD-10-CM

## 2011-01-18 DIAGNOSIS — I493 Ventricular premature depolarization: Secondary | ICD-10-CM | POA: Insufficient documentation

## 2011-01-18 DIAGNOSIS — I4891 Unspecified atrial fibrillation: Secondary | ICD-10-CM

## 2011-01-18 DIAGNOSIS — I4949 Other premature depolarization: Secondary | ICD-10-CM

## 2011-01-18 DIAGNOSIS — E7800 Pure hypercholesterolemia, unspecified: Secondary | ICD-10-CM

## 2011-01-18 NOTE — Assessment & Plan Note (Signed)
The patient reports that after he was seen in our office last week his pulse remained in her regular until 2 days ago when he noticed that he felt better when he checked his pulse it was now regular.He reports that prior to going into atrial fibrillation last week he had drunk a bottle of Dr. Reino Kent which contains caffeine.  The patient has not been having any problems from his anticoagulation with Xarelto.  We will continue him on Xarelto 20 mg daily.  We will continue his present dose of metoprolol and diltiazem as well.

## 2011-01-18 NOTE — Assessment & Plan Note (Signed)
His EKG today reviewed by me shows restoration of normal sinus rhythm with a ventricular response of 65.  He has a right bundle branch block.  He has occasional unifocal PVCs.  He has not been aware of the PVCs except that when he takes his blood pressure with his machine at home sometimes the blood pressure does not record properly because of the interference from the PVC.  The patient will continue to avoid caffeine

## 2011-01-18 NOTE — Progress Notes (Signed)
Connor [name] Date of Birth:  1933-07-14 [facility] Cardiology / [facility] [address]   [address] [place], [place]  [address] [phone]           Fax   [phone]  History of Present Illness: This pleasant 77 year old gentleman is seen back for a scheduled followup office visit.  He has a past history of recurrent atrial flutter and fibrillation.  On may 20 seconds, 2012 the patient underwent a flutter ablation by Dr. Johney Frame.  He had done well until a week ago when he noted that his heart was racing and he came in and saw Dr. [place] in my absence and was found to be in recurrent atrial fibrillation.  He was placed on diltiazem and Xarelto and returns today for a followup visit.  The patient does not have any history of ischemic heart disease.  He had a normal treadmill Cardiolite stress test on 07/15/03.  He had an echocardiogram on 12/04/09 which showed an ejection fraction of 55-60% and biatrial enlargement and mild aortic sclerosis with mild aortic insufficiency and normal pulmonary artery pressure.  Current Outpatient Prescriptions  Medication Sig Dispense Refill  . Ascorbic Acid (VITAMIN C PO) Take 1 tablet by mouth daily.        Marland Kitchen atorvastatin (LIPITOR) 10 MG tablet Take 10 mg by mouth daily.        . Calcium Carbonate (CALCIUM 500 PO) 1 tab po qd       . Cholecalciferol (VITAMIN D3) 5000 UNITS TABS Take 5,000 Units by mouth daily.        Marland Kitchen diltiazem (CARDIZEM CD) 240 MG 24 hr capsule Take 1 capsule (240 mg total) by mouth daily.  30 capsule  11  . Docusate Calcium (STOOL SOFTENER PO) Take by mouth daily.       . metoprolol tartrate (LOPRESSOR) 25 MG tablet Take 25 mg by mouth 2 (two) times daily.        . Multiple Vitamin (MULTIVITAMIN) tablet Take 1 tablet by mouth daily.        . Multiple Vitamins-Minerals (ICAPS PO) Take by mouth daily.        . Omega-3 Fatty Acids (OMEGA 3 PO) Take by mouth daily.        Marland Kitchen omeprazole (PRILOSEC) 20 MG capsule Take 20 mg by mouth daily.         . Rivaroxaban (XARELTO) 20 MG TABS Take 20 mg by mouth daily.  30 tablet  6  . Tamsulosin HCl (FLOMAX) 0.4 MG CAPS Take 0.4 mg by mouth daily.          Allergies  Allergen Reactions  . Penicillins     Patient Active Problem List  Diagnoses  . Atrial flutter  . Hyperlipidemia  . Hypertension  . Osteoarthritis  . BPH (benign prostatic hyperplasia)  . GERD (gastroesophageal reflux disease)  . Atrial fibrillation with rapid ventricular response    History  Smoking status  . Former Smoker  . Types: Cigarettes  . Quit date: 05/06/1978  Smokeless tobacco  . Never Used    History  Alcohol Use No    Family History  Problem Relation Age of Onset  . Heart attack Father   . Lung disease Mother   . Atrial fibrillation Brother   . Arrhythmia Sister     Review of Systems: Constitutional: no fever chills diaphoresis or fatigue or change in weight.  Head and neck: no hearing loss, no epistaxis, no photophobia or visual disturbance. Respiratory:  No cough, shortness of breath or wheezing. Cardiovascular: No chest pain peripheral edema, palpitations. Gastrointestinal: No abdominal distention, no abdominal pain, no change in bowel habits hematochezia or melena. Genitourinary: No dysuria, no frequency, no urgency, no nocturia. Musculoskeletal:No arthralgias, no back pain, no gait disturbance or myalgias. Neurological: No dizziness, no headaches, no numbness, no seizures, no syncope, no weakness, no tremors. Hematologic: No lymphadenopathy, no easy bruising. Psychiatric: No confusion, no hallucinations, no sleep disturbance.    Physical Exam: Filed Vitals:   01/18/11 1035  BP: 120/80  Pulse: 65  The general appearance reveals a well-developed well-nourished gentleman in no distress.The head and neck exam reveals pupils equal and reactive.  Extraocular movements are full.  There is no scleral icterus.  The mouth and pharynx are normal.  The neck is supple.  The carotids  reveal no bruits.  The jugular venous pressure is normal.  The  thyroid is not enlarged.  There is no lymphadenopathy.  The chest is clear to percussion and auscultation.  There are no rales or rhonchi.  Expansion of the chest is symmetrical.  The precordium is quiet.  The first heart sound is normal.  The second heart sound is physiologically split.  There is no murmur gallop rub or click.  There is no abnormal lift or heave.  The abdomen is soft and nontender.  The bowel sounds are normal.  The liver and spleen are not enlarged.  There are no abdominal masses.  There are no abdominal bruits.  Extremities reveal good pedal pulses.  There is no phlebitis or edema.  There is no cyanosis or clubbing.  Strength is normal and symmetrical in all extremities.  There is no lateralizing weakness.  There are no sensory deficits.  The skin is warm and dry.  There is no rash.     Assessment / Plan: Continue present medication including Xarelto And diltiazem.

## 2011-01-18 NOTE — Assessment & Plan Note (Signed)
The patient has a past history of hypercholesterolemia.  He remains on Lipitor 10 mg daily.  He is not having any myalgias or statin side effects

## 2011-01-28 ENCOUNTER — Telehealth: Payer: Self-pay | Admitting: Cardiology

## 2011-01-28 NOTE — Telephone Encounter (Signed)
Agree with plan 

## 2011-01-28 NOTE — Telephone Encounter (Signed)
Pt said his heart went into afib over the weekend heart 91 and up to 129 on sat please call

## 2011-01-28 NOTE — Telephone Encounter (Signed)
Did convert with extra Metoprolol and heart rate in 80's now.  Discussed with  Dr. Patty Sermons and will increase metoprolol 25 mg 2 tabs twice daily and let us know how he does on that dose. Advised wife, patient driving.

## 2011-01-30 ENCOUNTER — Other Ambulatory Visit: Payer: Self-pay | Admitting: *Deleted

## 2011-01-30 ENCOUNTER — Telehealth: Payer: Self-pay | Admitting: Cardiology

## 2011-01-30 NOTE — Telephone Encounter (Signed)
Advised patient and he will call Friday with update

## 2011-01-30 NOTE — Telephone Encounter (Signed)
Patient taking 25 mg two twice daily, will increase to two three times a day per  Dr. Patty Sermons

## 2011-01-30 NOTE — Telephone Encounter (Signed)
Heart rate this am 92 blood pressure 142/86. Heart rate upper 80's-90's.  This is with the blood pressure machine.  Still having irregular beats (3 strong beats and then "like a pause").  Does feel like he is back out of rhythm, very sluggish and just sitting around.  Is suppose to go to the [employer].  Did increase Metoprolol to 2 twice a day.  Going to [facility] with family October 11th and is hoping to be back in rhythm either by cardioversion or it converting on its own.  Please advise

## 2011-01-30 NOTE — Telephone Encounter (Signed)
Agree with plan 

## 2011-01-30 NOTE — Telephone Encounter (Signed)
Continue present dose of diltiazem and increase metoprolol to 50 mg twice a day

## 2011-01-30 NOTE — Telephone Encounter (Signed)
Pt to call with update on medication

## 2011-02-01 ENCOUNTER — Telehealth: Payer: Self-pay | Admitting: Cardiology

## 2011-02-01 NOTE — Telephone Encounter (Signed)
Pt calling with update from med

## 2011-02-01 NOTE — Telephone Encounter (Signed)
Heart rate low 70's, but thinks he's still out of rhythm.  Per  Dr. Patty Sermons continue same dose of medications and call back Monday with update

## 2011-02-04 ENCOUNTER — Telehealth: Payer: Self-pay | Admitting: Cardiology

## 2011-02-04 DIAGNOSIS — I119 Hypertensive heart disease without heart failure: Secondary | ICD-10-CM

## 2011-02-04 DIAGNOSIS — I4891 Unspecified atrial fibrillation: Secondary | ICD-10-CM

## 2011-02-04 NOTE — Telephone Encounter (Signed)
Advised and scheduled appointment with Lawson Fiscal NP tomorrow

## 2011-02-04 NOTE — Telephone Encounter (Signed)
Pt is calling about BP readings and heart rate

## 2011-02-04 NOTE — Telephone Encounter (Signed)
Heart rate 58 and blood pressure 140/60

## 2011-02-04 NOTE — Telephone Encounter (Signed)
Heart rate 58 this am. This was his heart rate right after taking his metoprolol.  Running in 60's over weekend.  States he's feeling better except for decreased stamina.  Metoprolol 25 mg 2 three times a day.  Thinks he may have converted Thursday or Friday.  Is going to check his heart rate now and will call him back.

## 2011-02-04 NOTE — Telephone Encounter (Signed)
It sounds like he may have converted if he is still not feeling well.  We might try to have him seen this week by myself or Lawson Fiscal

## 2011-02-05 ENCOUNTER — Encounter: Payer: Self-pay | Admitting: Nurse Practitioner

## 2011-02-05 ENCOUNTER — Ambulatory Visit (INDEPENDENT_AMBULATORY_CARE_PROVIDER_SITE_OTHER): Payer: Medicare Other | Admitting: Nurse Practitioner

## 2011-02-05 VITALS — BP 92/60 | HR 64 | Ht 72.0 in | Wt 218.0 lb

## 2011-02-05 DIAGNOSIS — I4891 Unspecified atrial fibrillation: Secondary | ICD-10-CM

## 2011-02-05 MED ORDER — METOPROLOL TARTRATE 25 MG PO TABS: 25.0000 mg | ORAL_TABLET | Freq: Two times a day (BID) | ORAL | Status: DC

## 2011-02-05 NOTE — Progress Notes (Signed)
Connor [name] Date of Birth: [birth year]   History of Present Illness: Mr. Wildeman is seen back today for a work in visit. He is seen for Dr. Patty Sermons. He has had another episode of recurrent atrial fib about a week ago. This is his 2nd time since his ablation. Metoprolol was increased to a TID dosing schedule. He is back in rhythm. He notes that he is more fatigued with the higher dose of beta blocker. He remains on Xarelto. He has had friends tell him this was a bad drug. No bleeding or adverse effects reported. His ablation was back in March.   Current Outpatient Prescriptions on File Prior to Visit  Medication Sig Dispense Refill  . Ascorbic Acid (VITAMIN C PO) Take 1 tablet by mouth daily.        Marland Kitchen atorvastatin (LIPITOR) 10 MG tablet Take 10 mg by mouth daily.        . Calcium Carbonate (CALCIUM 500 PO) 1 tab po qd       . Cholecalciferol (VITAMIN D3) 5000 UNITS TABS Take 5,000 Units by mouth daily.        Marland Kitchen diltiazem (CARDIZEM CD) 240 MG 24 hr capsule Take 1 capsule (240 mg total) by mouth daily.  30 capsule  11  . Docusate Calcium (STOOL SOFTENER PO) Take by mouth daily.       . Multiple Vitamin (MULTIVITAMIN) tablet Take 1 tablet by mouth daily.        . Multiple Vitamins-Minerals (ICAPS PO) Take by mouth daily.        . Omega-3 Fatty Acids (OMEGA 3 PO) Take by mouth daily.        Marland Kitchen omeprazole (PRILOSEC) 20 MG capsule Take 20 mg by mouth daily.        . Rivaroxaban (XARELTO) 20 MG TABS Take 20 mg by mouth daily.  30 tablet  6  . Tamsulosin HCl (FLOMAX) 0.4 MG CAPS Take 0.4 mg by mouth daily.        Marland Kitchen DISCONTD: metoprolol tartrate (LOPRESSOR) 25 MG tablet Take 25 mg by mouth as directed. 2 tablets three times a day        Allergies  Allergen Reactions  . Penicillins     Past Medical History  Diagnosis Date  . Hypertension   . Atrial flutter     s/p ablation  . Hyperlipidemia   . Vitamin D deficiency   . RBBB (right bundle branch block)   . BPH (benign prostatic  hyperplasia)   . Osteoarthritis   . Anemia   . Peptic ulcer disease   . DVT (deep venous thrombosis) 2/12    in setting of R hip surgery, he developed R femoral DVT  . Chronic anticoagulation     Past Surgical History  Procedure Date  . Total hip arthroplasty 06/04/10    RIGHT HIP  . Total knee arthroplasty   . Transurethral resection of prostate   . Atrial flutter ablation 5/12    by JA    History  Smoking status  . Former Smoker  . Types: Cigarettes  . Quit date: 05/06/1978  Smokeless tobacco  . Never Used    History  Alcohol Use No    Family History  Problem Relation Age of Onset  . Heart attack Father   . Lung disease Mother   . Atrial fibrillation Brother   . Arrhythmia Sister     Review of Systems: The review of systems is positive for recurrent arrhythmia, now  back in sinus. He says he has less stamina. Tries to stay active.  All other systems were reviewed and are negative.  Physical Exam: BP 92/60  Pulse 64  Ht 6' (1.829 m)  Wt 218 lb (98.884 kg)  BMI 29.57 kg/m2 Patient is very pleasant and in no acute distress. Skin is warm and dry. Color is normal.  HEENT is unremarkable. Normocephalic/atraumatic. PERRL. Sclera are nonicteric. Neck is supple. No masses. No JVD. Lungs are clear. Cardiac exam shows a regular rate and rhythm. Abdomen is soft. Extremities are without edema. Gait and ROM are intact. No gross neurologic deficits noted.   LABORATORY DATA: EKG with sinus with RBBB. Tracing is unchanged.    Assessment / Plan:

## 2011-02-05 NOTE — Patient Instructions (Addendum)
You are in a regular rhythm today OK to cut the metoprolol back to just two times a day. This should help your stamina Stay on your other medicines Let us know if you have recurrent arrhythmia. Keep your November appointment with Dr. Patty Sermons

## 2011-02-05 NOTE — Assessment & Plan Note (Signed)
He has had 2 episodes since his ablation. He doesn't feel as well on the extra beta blocker. I have cut him back to just BID dosing. He will continue to monitor. He may need to see Dr. Johney Frame again if he continues to have recurrence. We reviewed the indications for Xarelto. He will see Dr. Patty Sermons as scheduled next month. Patient is agreeable to this plan and will call if any problems develop in the interim.

## 2011-02-07 LAB — PROTIME-INR
INR: 1.1 (ref 0.00–1.49)
INR: 1.2 (ref 0.00–1.49)
INR: 1.6 — ABNORMAL HIGH (ref 0.00–1.49)
Prothrombin Time: 14 seconds (ref 11.6–15.2)
Prothrombin Time: 14.4 s (ref 11.6–15.2)
Prothrombin Time: 16 s — ABNORMAL HIGH (ref 11.6–15.2)

## 2011-02-07 LAB — TROPONIN I: Troponin I: 0.09 ng/mL — ABNORMAL HIGH (ref 0.00–0.06)

## 2011-02-07 LAB — CARDIAC PANEL(CRET KIN+CKTOT+MB+TROPI)
CK, MB: 0.8 ng/mL (ref 0.3–4.0)
Relative Index: INVALID (ref 0.0–2.5)
Total CK: 97 U/L (ref 7–232)

## 2011-02-07 LAB — COMPREHENSIVE METABOLIC PANEL
AST: 18 U/L (ref 0–37)
Albumin: 3.4 g/dL — ABNORMAL LOW (ref 3.5–5.2)
Calcium: 8.8 mg/dL (ref 8.4–10.5)
Chloride: 105 mEq/L (ref 96–112)
Creatinine, Ser: 0.75 mg/dL (ref 0.4–1.5)
GFR calc Af Amer: >60 mL/min (ref >60)
Total Protein: 6.1 g/dL (ref 6.0–8.3)

## 2011-02-07 LAB — CK TOTAL AND CKMB (NOT AT ARMC)
CK, MB: 0.9 ng/mL (ref 0.3–4.0)
Relative Index: INVALID (ref 0.0–2.5)
Total CK: 82 U/L (ref 7–232)

## 2011-02-07 LAB — B-NATRIURETIC PEPTIDE (CONVERTED LAB): Pro B Natriuretic peptide (BNP): 64 pg/mL (ref 0.0–100.0)

## 2011-02-07 LAB — CBC
HCT: 43.6 % (ref 39.0–52.0)
Platelets: 144 10*3/uL — ABNORMAL LOW (ref 150–400)
WBC: 7.2 10*3/uL (ref 4.0–10.5)

## 2011-02-07 LAB — APTT

## 2011-02-07 LAB — TSH: TSH: 1.567 u[IU]/mL (ref 0.350–4.500)

## 2011-02-08 NOTE — Progress Notes (Signed)
Addended by: Royanne Foots on: 02/08/2011 10:04 AM   Modules accepted: Orders

## 2011-02-18 ENCOUNTER — Other Ambulatory Visit: Payer: Self-pay | Admitting: Cardiology

## 2011-03-11 ENCOUNTER — Encounter: Payer: Self-pay | Admitting: Cardiology

## 2011-03-11 ENCOUNTER — Ambulatory Visit (INDEPENDENT_AMBULATORY_CARE_PROVIDER_SITE_OTHER): Payer: Medicare Other | Admitting: Cardiology

## 2011-03-11 VITALS — BP 128/88 | HR 63 | Ht 72.0 in | Wt 217.0 lb

## 2011-03-11 DIAGNOSIS — I119 Hypertensive heart disease without heart failure: Secondary | ICD-10-CM

## 2011-03-11 DIAGNOSIS — I10 Essential (primary) hypertension: Secondary | ICD-10-CM

## 2011-03-11 DIAGNOSIS — I4892 Unspecified atrial flutter: Secondary | ICD-10-CM

## 2011-03-11 DIAGNOSIS — E785 Hyperlipidemia, unspecified: Secondary | ICD-10-CM

## 2011-03-11 DIAGNOSIS — I4891 Unspecified atrial fibrillation: Secondary | ICD-10-CM

## 2011-03-11 DIAGNOSIS — M1990 Unspecified osteoarthritis, unspecified site: Secondary | ICD-10-CM

## 2011-03-11 DIAGNOSIS — I480 Paroxysmal atrial fibrillation: Secondary | ICD-10-CM

## 2011-03-11 MED ORDER — METOPROLOL TARTRATE 50 MG PO TABS: 50.0000 mg | ORAL_TABLET | Freq: Two times a day (BID) | ORAL | Status: DC

## 2011-03-11 NOTE — Assessment & Plan Note (Signed)
The patient has a history of osteoarthritis.  Since we last saw him he had to get another cortisone shot in his left hip several weeks ago.  He has noted significant improvement.

## 2011-03-11 NOTE — Assessment & Plan Note (Signed)
His last episode of atrial flutter fibrillation occurred over the past weekend.  He did not have to go to the hospital.  He took extra medication and rested and by that evening he was back in sinus rhythm.

## 2011-03-11 NOTE — Assessment & Plan Note (Signed)
The patient has a history of dyslipidemia.  His lipids are followed by his endocrinologist Dr. Lucianne Muss.

## 2011-03-11 NOTE — Assessment & Plan Note (Signed)
The patient checks his blood pressure at home several times a day.  Generally his pressures have been running in the satisfactory range on his current therapy which includes diltiazem and metoprolol tartrate

## 2011-03-11 NOTE — Patient Instructions (Signed)
Sent new Rx for Metoprolol 50 mg 1 twice a day instead of taking 25 mg 2 twice a day- [facility] [place]  Your physician recommends that you schedule a follow-up appointment in: 3 months

## 2011-03-11 NOTE — Progress Notes (Signed)
[name] Date of Birth:  1933/11/23 [facility] / [facility] [address]   [address] [place], [place]  [address] [phone]           Fax   [phone]  History of Present Illness: This pleasant 77 year old gentleman is seen for a scheduled followup office visit the is a past history of paroxysmal atrial fibrillation.  He had an ablation of his atrial flutter fibrillation in March 2012.  Since then he has had 3 recurrences of atrial flutter fibrillation with rapid response.  His most recent episode was this past weekend but it lasted less than a day.  The other episodes lasted several days.  He feels that he is improving.  Current Outpatient Prescriptions  Medication Sig Dispense Refill  . Ascorbic Acid (VITAMIN C PO) Take 1 tablet by mouth daily.        Marland Kitchen atorvastatin (LIPITOR) 10 MG tablet Take 10 mg by mouth daily.        . Calcium Carbonate (CALCIUM 500 PO) 1 tab po qd       . Cholecalciferol (VITAMIN D3) 5000 UNITS TABS Take 5,000 Units by mouth daily.        Marland Kitchen diltiazem (CARDIZEM CD) 240 MG 24 hr capsule Take 1 capsule (240 mg total) by mouth daily.  30 capsule  11  . Docusate Calcium (STOOL SOFTENER PO) Take by mouth daily.       . metoprolol tartrate (LOPRESSOR) 50 MG tablet Take 1 tablet (50 mg total) by mouth 2 (two) times daily.  180 tablet  3  . Multiple Vitamin (MULTIVITAMIN) tablet Take 1 tablet by mouth daily.        . Multiple Vitamins-Minerals (ICAPS PO) Take by mouth daily.        . Omega-3 Fatty Acids (OMEGA 3 PO) Take by mouth daily.        Marland Kitchen omeprazole (PRILOSEC) 20 MG capsule Take 20 mg by mouth daily.        . Rivaroxaban (XARELTO) 20 MG TABS Take 20 mg by mouth daily.  30 tablet  6  . Tamsulosin HCl (FLOMAX) 0.4 MG CAPS Take 0.4 mg by mouth daily.        Marland Kitchen DISCONTD: metoprolol tartrate (LOPRESSOR) 25 MG tablet Take 1 tablet (25 mg total) by mouth 2 (two) times daily. 2 tablets three times a day      . DISCONTD: metoprolol tartrate  (LOPRESSOR) 25 MG tablet Take 25 mg by mouth 2 (two) times daily. 2 bid         Allergies  Allergen Reactions  . Penicillins     Patient Active Problem List  Diagnoses  . Atrial flutter  . Hyperlipidemia  . Hypertension  . Osteoarthritis  . BPH (benign prostatic hyperplasia)  . GERD (gastroesophageal reflux disease)  . Atrial fibrillation with rapid ventricular response  . PVC (premature ventricular contraction)    History  Smoking status  . Former Smoker  . Types: Cigarettes  . Quit date: 05/06/1978  Smokeless tobacco  . Never Used    History  Alcohol Use No    Family History  Problem Relation Age of Onset  . Heart attack Father   . Lung disease Mother   . Atrial fibrillation Brother   . Arrhythmia Sister     Review of Systems: Constitutional: no fever chills diaphoresis or fatigue or change in weight.  Head and neck: no hearing loss, no epistaxis, no photophobia or visual disturbance. Respiratory: No cough,  shortness of breath or wheezing. Cardiovascular: No chest pain peripheral edema, palpitations. Gastrointestinal: No abdominal distention, no abdominal pain, no change in bowel habits hematochezia or melena. Genitourinary: No dysuria, no frequency, no urgency, no nocturia. Musculoskeletal:No arthralgias, no back pain, no gait disturbance or myalgias. Neurological: No dizziness, no headaches, no numbness, no seizures, no syncope, no weakness, no tremors. Hematologic: No lymphadenopathy, no easy bruising. Psychiatric: No confusion, no hallucinations, no sleep disturbance.    Physical Exam: Filed Vitals:   03/11/11 1011  BP: 128/88  Pulse: 63   Gen. appearance reveals a well-developed well-nourished gentleman in no distress.Pupils equal and reactive.   Extraocular Movements are full.  There is no scleral icterus.  The mouth and pharynx are normal.  The neck is supple.  The carotids reveal no bruits.  The jugular venous pressure is normal.  The thyroid is  not enlarged.  There is no lymphadenopathy.  The chest is clear to percussion and auscultation. There are no rales or rhonchi. Expansion of the chest is symmetrical.  The precordium is quiet.  The first heart sound is normal.  The second heart sound is physiologically split.  There is no murmur gallop rub or click.  There is no abnormal lift or heave.  The abdomen is soft and nontender. Bowel sounds are normal. The liver and spleen are not enlarged. There Are no abdominal masses. There are no bruits.  The pedal pulses are good.  There is no phlebitis or edema.  There is no cyanosis or clubbing. Strength is normal and symmetrical in all extremities.  There is no lateralizing weakness.  There are no sensory deficits.   EKG today shows normal sinus rhythm right bundle branch block and no acute changes   Assessment / Plan:  continue same medication.  Recheck in 3 months for office visit and EKG.  He is now taking the 50 mg metoprolol tartrate tablets twice a day rather than the 25 mg tablet

## 2011-05-20 ENCOUNTER — Telehealth: Payer: Self-pay | Admitting: Cardiology

## 2011-05-20 NOTE — Telephone Encounter (Signed)
New Msg: Pt calling wanting to speak with nurse regarding pt having some issues with pt medication. Please return pt call to discuss further.

## 2011-05-20 NOTE — Telephone Encounter (Signed)
Scheduled appointment for patient with Lawson Fiscal NP, advised of date and time

## 2011-05-20 NOTE — Telephone Encounter (Signed)
Consider office visit with Connor [name] this week to evaluate his fatigue.

## 2011-05-20 NOTE — Telephone Encounter (Signed)
Blood pressure at home as been running 120's1-140's/ 60's-70's  HR 54-70's When he gets up and starts moving around gets very worn out and increased fatigue in general.  States he's not in A Fib.  His exercised has decreased secondary to back problems and not sure if this is the problem.  Denies any shortness of breath Appointment 06/11/11. Will forward to  Dr. Patty Sermons for review

## 2011-05-22 ENCOUNTER — Ambulatory Visit (INDEPENDENT_AMBULATORY_CARE_PROVIDER_SITE_OTHER): Payer: Medicare Other | Admitting: Nurse Practitioner

## 2011-05-22 ENCOUNTER — Encounter: Payer: Self-pay | Admitting: Nurse Practitioner

## 2011-05-22 VITALS — BP 114/76 | HR 62 | Ht 72.0 in | Wt 218.0 lb

## 2011-05-22 DIAGNOSIS — R5383 Other fatigue: Secondary | ICD-10-CM | POA: Insufficient documentation

## 2011-05-22 DIAGNOSIS — R5381 Other malaise: Secondary | ICD-10-CM

## 2011-05-22 LAB — CBC WITH DIFFERENTIAL/PLATELET
Basophils Absolute: 0 10*3/uL (ref 0.0–0.1)
Basophils Relative: 0.6 % (ref 0.0–3.0)
Eosinophils Absolute: 0.1 10*3/uL (ref 0.0–0.7)
Eosinophils Relative: 2.7 % (ref 0.0–5.0)
HCT: 43.7 % (ref 39.0–52.0)
Hemoglobin: 14.8 g/dL (ref 13.0–17.0)
Lymphocytes Relative: 21.7 % (ref 12.0–46.0)
Lymphs Abs: 1.2 10*3/uL (ref 0.7–4.0)
MCHC: 34 g/dL (ref 30.0–36.0)
MCV: 102.4 fl — ABNORMAL HIGH (ref 78.0–100.0)
Monocytes Absolute: 0.7 10*3/uL (ref 0.1–1.0)
Monocytes Relative: 13.2 % — ABNORMAL HIGH (ref 3.0–12.0)
Neutro Abs: 3.4 10*3/uL (ref 1.4–7.7)
Neutrophils Relative %: 61.8 % (ref 43.0–77.0)
Platelets: 137 10*3/uL — ABNORMAL LOW (ref 150.0–400.0)
RBC: 4.26 Mil/uL (ref 4.22–5.81)
RDW: 14 % (ref 11.5–14.6)
WBC: 5.4 10*3/uL (ref 4.5–10.5)

## 2011-05-22 LAB — BASIC METABOLIC PANEL
BUN: 18 mg/dL (ref 6–23)
CO2: 28 mEq/L (ref 19–32)
Calcium: 8.8 mg/dL (ref 8.4–10.5)
Chloride: 108 mEq/L (ref 96–112)
Creatinine, Ser: 0.8 mg/dL (ref 0.4–1.5)
GFR: 95.94 mL/min (ref >60.00)
Glucose, Bld: 86 mg/dL (ref 70–99)
Potassium: 4.2 mEq/L (ref 3.5–5.1)
Sodium: 145 mEq/L (ref 135–145)

## 2011-05-22 NOTE — Assessment & Plan Note (Signed)
His symptoms are somewhat worrisome. He is not one to complain. Has a remote smoking history. Does have hyperlipidemia, treated with Lipitor. Father died of an MI at age 76. We will update his stress test. I would like to try and walk him on the treadmill, but may have to convert to a lexiscan. We will check labs today. He is to keep his appointment with Dr. Patty Sermons for early February. Patient is agreeable to this plan and will call if any problems develop in the interim.

## 2011-05-22 NOTE — Patient Instructions (Signed)
We are going to arrange for a stress test and recheck your labs.  Keep your next appointment with Dr. Patty Sermons.  Call the [facility] office at [phone] if you have any questions, problems or concerns.

## 2011-05-22 NOTE — Progress Notes (Signed)
Connor [name] Date of Birth: [birth year] Medical Record #[id number]  History of Present Illness: [name] is seen today for a work in visit. He is seen for Dr. Patty Sermons. He has hyperlipidemia and history of atrial fib/flutter. He had an atrial flutter ablation back in March. He has had a couple of recurrent spells of atrial fib that are short lived and do not seem too bothersome for him. He remains on chronic anticoagulation with Xarelto.   He comes in today. He is complaining of fatigue that is only exertional in nature. He notes that he just "wears out" with very little activity. He can exert himself for about 10 minutes and then it takes about 15 minutes to recover. He tried to walk with his wife last week and had to stop. He denies any chest pain or tightness. Not short of breath. Feels fine at rest. He is worried about having CAD. Says that these spells are different from his atrial fib. Last stress test was in 2005.   Current Outpatient Prescriptions on File Prior to Visit  Medication Sig Dispense Refill  . atorvastatin (LIPITOR) 10 MG tablet Take 10 mg by mouth daily.        . Calcium Carbonate (CALCIUM 500 PO) Take 500 mg by mouth 2 (two) times daily.       . Cholecalciferol (VITAMIN D3) 5000 UNITS TABS Take 5,000 Units by mouth daily.        Marland Kitchen diltiazem (CARDIZEM CD) 240 MG 24 hr capsule Take 1 capsule (240 mg total) by mouth daily.  30 capsule  11  . Docusate Calcium (STOOL SOFTENER PO) Take by mouth daily.       . metoprolol tartrate (LOPRESSOR) 50 MG tablet Take 1 tablet (50 mg total) by mouth 2 (two) times daily.  180 tablet  3  . Multiple Vitamins-Minerals (ICAPS PO) Take by mouth daily.        . Omega-3 Fatty Acids (OMEGA 3 PO) Take by mouth daily.        Marland Kitchen omeprazole (PRILOSEC) 20 MG capsule Take 20 mg by mouth daily.        . Rivaroxaban (XARELTO) 20 MG TABS Take 20 mg by mouth daily.  30 tablet  6  . Tamsulosin HCl (FLOMAX) 0.4 MG CAPS Take 0.4 mg by mouth daily.           Allergies  Allergen Reactions  . Penicillins     Past Medical History  Diagnosis Date  . Hypertension   . Atrial flutter     s/p ablation in March 2012  . Hyperlipidemia   . Vitamin D deficiency   . RBBB (right bundle branch block)   . BPH (benign prostatic hyperplasia)   . Osteoarthritis   . Anemia   . Peptic ulcer disease   . DVT (deep venous thrombosis) 2/12    in setting of R hip surgery, he developed R femoral DVT  . Chronic anticoagulation     on Xarelto    Past Surgical History  Procedure Date  . Total hip arthroplasty 06/04/10    RIGHT HIP  . Total knee arthroplasty   . Transurethral resection of prostate   . Atrial flutter ablation 5/12    by JA    History  Smoking status  . Former Smoker  . Types: Cigarettes  . Quit date: 05/06/1978  Smokeless tobacco  . Never Used    History  Alcohol Use No    Family History  Problem Relation  Age of Onset  . Heart attack Father   . Lung disease Mother   . Atrial fibrillation Brother   . Arrhythmia Sister     Review of Systems: The review of systems is positive for exertional fatigue. Does have some back pain that can limit him.  All other systems were reviewed and are negative.  Physical Exam: BP 114/76  Pulse 62  Ht 6' (1.829 m)  Wt 218 lb (98.884 kg)  BMI 29.57 kg/m2 Patient is very pleasant and in no acute distress. Skin is warm and dry. Color is normal.  HEENT is unremarkable. Normocephalic/atraumatic. PERRL. Sclera are nonicteric. Neck is supple. No masses. No JVD. Lungs are clear. Cardiac exam shows a regular rate and rhythm. Occasional ectopic noted. Abdomen is soft. Extremities are without edema. Gait and ROM are intact. No gross neurologic deficits noted.   LABORATORY DATA:   Assessment / Plan:

## 2011-05-23 ENCOUNTER — Ambulatory Visit (HOSPITAL_COMMUNITY): Payer: Medicare Other | Attending: Cardiology | Admitting: Radiology

## 2011-05-23 VITALS — BP 145/90 | Ht 72.0 in | Wt 220.0 lb

## 2011-05-23 DIAGNOSIS — R5383 Other fatigue: Secondary | ICD-10-CM

## 2011-05-23 DIAGNOSIS — I493 Ventricular premature depolarization: Secondary | ICD-10-CM

## 2011-05-23 DIAGNOSIS — I10 Essential (primary) hypertension: Secondary | ICD-10-CM | POA: Insufficient documentation

## 2011-05-23 DIAGNOSIS — E785 Hyperlipidemia, unspecified: Secondary | ICD-10-CM | POA: Insufficient documentation

## 2011-05-23 DIAGNOSIS — I4949 Other premature depolarization: Secondary | ICD-10-CM

## 2011-05-23 DIAGNOSIS — I4891 Unspecified atrial fibrillation: Secondary | ICD-10-CM

## 2011-05-23 DIAGNOSIS — I4510 Unspecified right bundle-branch block: Secondary | ICD-10-CM | POA: Insufficient documentation

## 2011-05-23 DIAGNOSIS — R5381 Other malaise: Secondary | ICD-10-CM | POA: Insufficient documentation

## 2011-05-23 DIAGNOSIS — Z87891 Personal history of nicotine dependence: Secondary | ICD-10-CM | POA: Insufficient documentation

## 2011-05-23 DIAGNOSIS — R42 Dizziness and giddiness: Secondary | ICD-10-CM | POA: Insufficient documentation

## 2011-05-23 DIAGNOSIS — I4892 Unspecified atrial flutter: Secondary | ICD-10-CM

## 2011-05-23 DIAGNOSIS — Z8249 Family history of ischemic heart disease and other diseases of the circulatory system: Secondary | ICD-10-CM | POA: Insufficient documentation

## 2011-05-23 DIAGNOSIS — R0789 Other chest pain: Secondary | ICD-10-CM | POA: Insufficient documentation

## 2011-05-23 LAB — TSH: TSH: 1.25 u[IU]/mL (ref 0.35–5.50)

## 2011-05-23 MED ORDER — TECHNETIUM TC 99M TETROFOSMIN IV KIT
11.0000 | PACK | Freq: Once | INTRAVENOUS | Status: AC | PRN
  Administered 2011-05-23: 11 via INTRAVENOUS

## 2011-05-23 MED ORDER — TECHNETIUM TC 99M TETROFOSMIN IV KIT
33.0000 | PACK | Freq: Once | INTRAVENOUS | Status: AC | PRN
  Administered 2011-05-23: 33 via INTRAVENOUS

## 2011-05-23 NOTE — Progress Notes (Signed)
Connor [name]  Cardiology Nuclear Med Study  Connor [name] is a 78 y.o. male [id number] 02/18/1934   Nuclear Med Background Indication for Stress Test:  Evaluation for Ischemia History:  Atrial Fibrillation/Flutter; '05 OZH:[id number], EF=49%; '11 Echo:EF=55-60%, Mild AI; 212 Cardioversion; 3/12 Ablation Cardiac Risk Factors: Family History - CAD, History of Smoking, Hypertension, Lipids and RBBB  Symptoms:  Chest Tightness (last episode of chest discomfort was only with atrial fibrillation), Fatigue, Fatigue with Exertion and Light-Headedness   Nuclear Pre-Procedure Caffeine/Decaff Intake:  None NPO After: 8:00am   Lungs:  Clear.  O2 SAT 97% on RA. IV 0.9% NS with Angio Cath:  20g  IV Site: R Antecubital  IV Started by:  Bonnita Levan, RN  Chest Size (in):  46 Cup Size: n/a  Height: 6' (1.829 m)  Weight:  220 lb (99.791 kg)  BMI:  Body mass index is 29.84 kg/(m^2). Tech Comments:  Metoprolol held x 24 hours.    Nuclear Med Study 1 or 2 day study: 1 day  Stress Test Type:  Stress  Reading MD: Olga Millers, MD  Order Authorizing Provider:  Cassell Clement, MD  Resting Radionuclide: Technetium 69m Tetrofosmin  Resting Radionuclide Dose: 11.0 mCi   Stress Radionuclide:  Technetium 78m Tetrofosmin  Stress Radionuclide Dose: 33.0 mCi           Stress Protocol Rest HR: 86 Stress HR: 150  Rest BP: Sitting 145/90  Standing 133/86 Stress BP: 156/82  Exercise Time (min): 4:15 METS: 5.5   Predicted Max HR: 145 bpm % Max HR: 103.45 bpm Rate Pressure Product: 46962   Dose of Adenosine (mg):  n/a Dose of Lexiscan: n/a mg  Dose of Atropine (mg): n/a Dose of Dobutamine: n/a mcg/kg/min (at max HR)  Stress Test Technologist: Smiley Houseman, CMA-N  Nuclear Technologist:  Domenic Polite, CNMT     Rest Procedure:  Myocardial perfusion imaging was performed at rest 45 minutes following the intravenous  administration of Technetium 68m Tetrofosmin.  Rest ECG: RBBB with nonspecific ST-T wave changes and frequent PVC's with occasional couplets.  Stress Procedure:  The patient exercised for 4:15 on the treadmill utilizing the Bruce protocol.  The patient stopped due to fatigue and denied any chest pain.  There were more diffuse ST-T wave changes with exercise.  Technetium 67m Tetrofosmin was injected at peak exercise and myocardial perfusion imaging was performed after a brief delay.  Stress ECG: No significant ST segment change suggestive of ischemia.  QPS Raw Data Images:  Acquisition technically good; evidence of LVE. Stress Images:  There is decreased uptake in the inferior wall and apex. Rest Images:  There is decreased uptake in the inferior wall and apex; inferior defect less prominent compared to the stress images. Subtraction (SDS):  These findings are consistent with prior inferior and apical infarct vs thinning; mild inferior ischemia. Transient Ischemic Dilatation (Normal <1.22):  1.04 Lung/Heart Ratio (Normal <0.45):  0.36  Quantitative Gated Spect Images QGS EDV:  n/a QGS ESV:  n/a QGS cine images:  Study not gated QGS EF: Study not gated  Impression Exercise Capacity:  Poor exercise capacity. BP Response:  Normal blood pressure response. Clinical Symptoms:  No chest pain. ECG Impression:  No significant ST segment change suggestive of ischemia; frequent PVCs, PACs and brief PAT. 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  Olga Millers

## 2011-05-24 ENCOUNTER — Telehealth: Payer: Self-pay | Admitting: Cardiology

## 2011-05-24 ENCOUNTER — Telehealth: Payer: Self-pay | Admitting: *Deleted

## 2011-05-24 NOTE — Telephone Encounter (Signed)
Fu call °Patient returning your call °

## 2011-05-24 NOTE — Telephone Encounter (Signed)
Called patient to discuss test results and appointment,left message for him to call

## 2011-05-24 NOTE — Telephone Encounter (Signed)
Called patient and discussed stress test with him.  He will come Tuesday and see Lawson Fiscal NP for follow up

## 2011-05-27 ENCOUNTER — Telehealth: Payer: Self-pay | Admitting: *Deleted

## 2011-05-27 NOTE — Telephone Encounter (Signed)
Advised of labs 

## 2011-05-27 NOTE — Telephone Encounter (Signed)
Message copied by Burnell Blanks on Mon May 27, 2011  5:30 PM ------      Message from: Rosalio Macadamia      Created: Fri May 24, 2011  9:20 AM       Ok to report. Labs are satisfactory. Will see how his stress test turns out.

## 2011-05-28 ENCOUNTER — Other Ambulatory Visit: Payer: Self-pay | Admitting: Nurse Practitioner

## 2011-05-28 ENCOUNTER — Encounter (HOSPITAL_COMMUNITY): Payer: Self-pay | Admitting: Respiratory Therapy

## 2011-05-28 ENCOUNTER — Encounter: Payer: Self-pay | Admitting: Nurse Practitioner

## 2011-05-28 ENCOUNTER — Ambulatory Visit (INDEPENDENT_AMBULATORY_CARE_PROVIDER_SITE_OTHER): Payer: Medicare Other | Admitting: Nurse Practitioner

## 2011-05-28 VITALS — BP 98/70 | HR 62 | Ht 72.0 in | Wt 216.0 lb

## 2011-05-28 DIAGNOSIS — Z01818 Encounter for other preprocedural examination: Secondary | ICD-10-CM

## 2011-05-28 DIAGNOSIS — Z79899 Other long term (current) drug therapy: Secondary | ICD-10-CM

## 2011-05-28 DIAGNOSIS — R5381 Other malaise: Secondary | ICD-10-CM

## 2011-05-28 DIAGNOSIS — I10 Essential (primary) hypertension: Secondary | ICD-10-CM

## 2011-05-28 DIAGNOSIS — R5383 Other fatigue: Secondary | ICD-10-CM

## 2011-05-28 DIAGNOSIS — T148XXA Other injury of unspecified body region, initial encounter: Secondary | ICD-10-CM

## 2011-05-28 DIAGNOSIS — E785 Hyperlipidemia, unspecified: Secondary | ICD-10-CM

## 2011-05-28 DIAGNOSIS — R9439 Abnormal result of other cardiovascular function study: Secondary | ICD-10-CM

## 2011-05-28 DIAGNOSIS — I4891 Unspecified atrial fibrillation: Secondary | ICD-10-CM

## 2011-05-28 LAB — BASIC METABOLIC PANEL
BUN: 15 mg/dL (ref 6–23)
CO2: 30 mEq/L (ref 19–32)
Calcium: 9.2 mg/dL (ref 8.4–10.5)
Chloride: 103 mEq/L (ref 96–112)
Creatinine, Ser: 0.7 mg/dL (ref 0.4–1.5)
GFR: 116.78 mL/min (ref >60.00)
Glucose, Bld: 112 mg/dL — ABNORMAL HIGH (ref 70–99)
Potassium: 4 mEq/L (ref 3.5–5.1)
Sodium: 142 mEq/L (ref 135–145)

## 2011-05-28 LAB — CBC WITH DIFFERENTIAL/PLATELET
Basophils Absolute: 0 10*3/uL (ref 0.0–0.1)
Basophils Relative: 0.4 % (ref 0.0–3.0)
Eosinophils Absolute: 0.2 10*3/uL (ref 0.0–0.7)
Eosinophils Relative: 2.3 % (ref 0.0–5.0)
HCT: 46.7 % (ref 39.0–52.0)
Hemoglobin: 15.7 g/dL (ref 13.0–17.0)
Lymphocytes Relative: 17.3 % (ref 12.0–46.0)
Lymphs Abs: 1.2 10*3/uL (ref 0.7–4.0)
MCHC: 33.7 g/dL (ref 30.0–36.0)
MCV: 103 fl — ABNORMAL HIGH (ref 78.0–100.0)
Monocytes Absolute: 0.6 10*3/uL (ref 0.1–1.0)
Monocytes Relative: 8.4 % (ref 3.0–12.0)
Neutro Abs: 4.9 10*3/uL (ref 1.4–7.7)
Neutrophils Relative %: 71.6 % (ref 43.0–77.0)
Platelets: 148 10*3/uL — ABNORMAL LOW (ref 150.0–400.0)
RBC: 4.53 Mil/uL (ref 4.22–5.81)
RDW: 14.1 % (ref 11.5–14.6)
WBC: 6.9 10*3/uL (ref 4.5–10.5)

## 2011-05-28 LAB — APTT: aPTT: 35.3 s — ABNORMAL HIGH (ref 21.7–28.8)

## 2011-05-28 LAB — PROTIME-INR
INR: 1.1 ratio — ABNORMAL HIGH (ref 0.8–1.0)
Prothrombin Time: 11.8 s (ref 10.2–12.4)

## 2011-05-28 NOTE — Assessment & Plan Note (Signed)
He is treated with Lipitor.

## 2011-05-28 NOTE — Assessment & Plan Note (Signed)
His stress test is abnormal. Findings are shared with the patient and his wife. Cardiac catheterization is recommended for further evaluation and management. The procedure, risks and benefits have been shared with the patient and he is willing to proceed. He is scheduled for 1:30 on Friday February 1st with Dr. Swazil[place]. Xarelto will be held on Wednesday and Thursday prior to the procedure on Friday. We did discuss the addition of a platelet inhibitor if stenting is performed. He is felt to be committed to long term Xarelto therapy. Patient is agreeable to this plan and will call if any problems develop in the interim.

## 2011-05-28 NOTE — Assessment & Plan Note (Signed)
Blood pressure has been lower. He will continue to monitor. May need medication adjustment after catheterization is performed.

## 2011-05-28 NOTE — Progress Notes (Signed)
 Connor [name] Date of Birth: 10/22/1933 Medical Record #[id number]  History of Present Illness: Mr. Riedesel is seen back today for a follow up visit. He is seen with Dr. Brackbill. I recently saw him with the complaint of exertional fatigue that he thought was out of proportion. 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 There is mild inferior ischemia. He will be referred for cardiac catheterization. His study was not gated due to PVC's and PAC's. Last EF per echo was normal in 2009. He does have a history of atrial fib and had an ablation back in March. He has also had prior DVT. He is maintained on Xarelto.   He does not have chest pain. Has had exertional fatigue for some time. Not short of breath. Feels fine at rest. He does not feel like this is related to his atrial fib. His symptoms have persisted.   Current Outpatient Prescriptions on File Prior to Visit  Medication Sig Dispense Refill  . atorvastatin (LIPITOR) 10 MG tablet Take 10 mg by mouth daily.        . Calcium Carbonate (CALCIUM 500 PO) Take 500 mg by mouth 2 (two) times daily.       . Cholecalciferol (VITAMIN D3) 5000 UNITS TABS Take 5,000 Units by mouth daily.        . diltiazem (CARDIZEM CD) 240 MG 24 hr capsule Take 1 capsule (240 mg total) by mouth daily.  30 capsule  11  . Docusate Calcium (STOOL SOFTENER PO) Take by mouth daily.       . metoprolol tartrate (LOPRESSOR) 50 MG tablet Take 1 tablet (50 mg total) by mouth 2 (two) times daily.  180 tablet  3  . Multiple Vitamins-Minerals (ICAPS PO) Take by mouth daily.        . Omega-3 Fatty Acids (OMEGA 3 PO) Take by mouth daily.        . omeprazole (PRILOSEC) 20 MG capsule Take 20 mg by mouth daily.        . Rivaroxaban (XARELTO) 20 MG TABS Take 20 mg by mouth daily.  30 tablet  6  . Tamsulosin HCl (FLOMAX) 0.4 MG CAPS  Take 0.4 mg by mouth daily.          Allergies  Allergen Reactions  . Penicillins     Past Medical History  Diagnosis Date  . Hypertension   . Atrial flutter     s/p ablation in March 2012  . Hyperlipidemia   . Vitamin D deficiency   . RBBB (right bundle branch block)   . BPH (benign prostatic hyperplasia)   . Osteoarthritis   . Anemia   . Peptic ulcer disease   . DVT (deep venous thrombosis) 2/12    in setting of R hip surgery, he developed R femoral DVT  . Chronic anticoagulation     on Xarelto    Past Surgical History  Procedure Date  . Total hip arthroplasty 06/04/10    RIGHT HIP  . Total knee arthroplasty   . Transurethral resection of prostate   . Atrial flutter ablation 5/12    by JA    History  Smoking status  . Former Smoker  . Types: Cigarettes  . Quit date: 05/06/1978  Smokeless tobacco  . Never Used    History  Alcohol Use No    Family History  Problem   Relation Age of Onset  . Heart attack Father   . Lung disease Mother   . Atrial fibrillation Brother   . Arrhythmia Sister     Review of Systems: The review of systems is positive for exertional fatigue. No chest pain or shortness of breath. Has some palpitations.  All other systems were reviewed and are negative.  Physical Exam: Ht 6' (1.829 m)  Wt 216 lb (97.977 kg)  BMI 29.29 kg/m2 Patient is very pleasant and in no acute distress. Skin is warm and dry. Color is normal.  HEENT is unremarkable. Normocephalic/atraumatic. PERRL. Sclera are nonicteric. Neck is supple. No masses. No JVD. Lungs are clear. Cardiac exam shows a regular rate and rhythm. Abdomen is soft. Extremities are without edema. Gait and ROM are intact. No gross neurologic deficits noted.   LABORATORY DATA: EKG today shows sinus with a RBBB  Other labs are pending.    Assessment / Plan:  

## 2011-05-28 NOTE — Assessment & Plan Note (Signed)
He is in sinus today. He remains on Xarelto.

## 2011-05-28 NOTE — Patient Instructions (Signed)
We need to arrange for a cardiac catheterization with Dr. Swaziland for next Friday, February 1st.  You are scheduled for a cardiac catheterization on Friday, February 1st with Dr. Swaziland or associate.  Go to [facility] 2nd Floor Short Stay on Friday, February 1st at 11:30am. No food or drink after midnight on Thursday. You may take your medications with a sip of water on the day of your procedure.  No Xarelto on Wednesday January 30th and Thursday January 31st.    Coronary Angiography Coronary angiography is an X-ray procedure used to look at the arteries in the heart. In this procedure, a dye is injected through a long, hollow tube (catheter). The catheter is about the size of a piece of cooked spaghetti. The catheter injects a dye into an artery in your groin. X-rays are then taken to show if there is a blockage in the arteries of your heart. BEFORE THE PROCEDURE   Let your caregiver know if you have allergies to shellfish or contrast dye. Also let your caregiver know if you have kidney problems or failure.   Do not eat or drink starting from midnight up to the time of the procedure, or as directed.   You may drink enough water to take your medications the morning of the procedure if you were instructed to do so.   You should be at the hospital or outpatient facility where the procedure is to be done 60 minutes prior to the procedure or as directed.  PROCEDURE  You may be given an IV medication to help you relax before the procedure.   You will be prepared for the procedure by washing and shaving the area where the catheter will be inserted. This is usually done in the groin but may be done in the fold of your arm by your elbow.   A medicine will be given to numb your groin where the catheter will be inserted.   A specially trained doctor will insert the catheter into an artery in your groin. The catheter is guided by using a special type of X-ray (fluoroscopy) to the blood vessel  being examined.   A special dye is then injected into the catheter and X-rays are taken. The dye helps to show where any narrowing or blockages are located in the heart arteries.  AFTER THE PROCEDURE   After the procedure you will be kept in bed lying flat for several hours. You will be instructed to not bend or cross your legs.   The groin insertion site will be watched and checked frequently.   The pulse in your feet will be checked frequently.   Additional blood tests, X-rays and an EKG may be done.   You may stay in the hospital overnight for observation.  SEEK IMMEDIATE MEDICAL CARE IF:   You develop chest pain, shortness of breath, feel faint, or pass out.   There is bleeding, swelling, or drainage from the catheter insertion site.   You develop pain, discoloration, coldness, or severe bruising in the leg or area where the catheter was inserted.   You have a fever.  Document Released: 10/27/2002 Document Revised: 01/02/2011 Document Reviewed: 12/16/2007 [facility] Patient Information 2012 [place], [place].

## 2011-05-29 ENCOUNTER — Telehealth: Payer: Self-pay | Admitting: *Deleted

## 2011-05-29 NOTE — Telephone Encounter (Signed)
Message copied by Burnell Blanks on Wed May 29, 2011  9:03 AM ------      Message from: Rosalio Macadamia      Created: Wed May 29, 2011  7:43 AM       Ok to report. Labs are satisfactory. For cath next week.

## 2011-05-29 NOTE — Telephone Encounter (Signed)
Advised of labs 

## 2011-05-30 ENCOUNTER — Telehealth: Payer: Self-pay | Admitting: Cardiology

## 2011-05-30 NOTE — Telephone Encounter (Signed)
Spoke with pt. He is asking what to do if he goes into Atrial fib on the days he is holding Xarelto prior to cath.  He is currently in sinus rhythm but reports he has gone into afib in the past and it usually lasts 4-5 hours.  I told pt he should call us to let us know if this happened so Dr. Patty Sermons would be aware and symptoms could be evaluated.

## 2011-05-30 NOTE — Telephone Encounter (Signed)
New problem Pt is having procedure next Friday. He had some questions. Please call

## 2011-06-03 ENCOUNTER — Telehealth: Payer: Self-pay | Admitting: Cardiology

## 2011-06-03 MED ORDER — AZITHROMYCIN 250 MG PO TABS: ORAL_TABLET | ORAL | Status: AC

## 2011-06-03 NOTE — Telephone Encounter (Signed)
Advised patient

## 2011-06-03 NOTE — Telephone Encounter (Signed)
Please call in  a Z-Pak and use plain Mucinex

## 2011-06-03 NOTE — Telephone Encounter (Signed)
Cold, congestion, and headache that couldn't get rid of until this afternoon.  Denies fever or chest congestion, all in head.  Denies fever and drainage clear.  Rx something or just wait. Concerned because of cath scheduled for Friday

## 2011-06-03 NOTE — Telephone Encounter (Signed)
Heart cath scheduled for Friday 06-07-11, has developed a cold , pls call

## 2011-06-06 ENCOUNTER — Telehealth: Payer: Self-pay | Admitting: Cardiology

## 2011-06-06 DIAGNOSIS — I4891 Unspecified atrial fibrillation: Secondary | ICD-10-CM

## 2011-06-06 MED ORDER — ENOXAPARIN SODIUM 100 MG/ML ~~LOC~~ SOLN: 100.0000 mg | Freq: Once | SUBCUTANEOUS | Status: DC

## 2011-06-06 NOTE — Telephone Encounter (Signed)
Discussed with Lawson Fiscal, NP and she recommended patient go ahead and take an extra Metoprolol now and forward to  Dr. Patty Sermons for any other recommendations. She feels he needs to have cardiac cath tomorrow.

## 2011-06-06 NOTE — Telephone Encounter (Signed)
His weight is about 100 kg.  I want him to get and take 100mg  Lovenox SQ Stat, [just one dose].  This will protect him from stroke while off xarelto. Proceed with cath tomorrow. Agree with extra metoprolol for rate control.

## 2011-06-06 NOTE — Telephone Encounter (Signed)
New Msg: Pt calling wanting to speak to nurse about pt being in a-fib. Pt said he has been in a-fib since yesterday. Pt HR got down to 60 last night and pt thought he was doing fine. This AM when pt woke up his HR was 109. Pt stopped taking xarelto last night to prepare for upcoming CATH and pt is concerned about heart being out of rhythm without a blood thinner. Please return pt call to discuss further.

## 2011-06-06 NOTE — Telephone Encounter (Signed)
Would take extra dose of Metoprolol. Send message to Dr. Patty Sermons. He is for cath tomorrow with Dr. Swaziland.

## 2011-06-06 NOTE — Telephone Encounter (Signed)
Patient knows when he is in Afib, feels lightheaded.  Last night heart rate 94 so he took an extra Metoprolol.  Heart rate this am 109, blood pressure 127/93.  8:14 am hr 70 and blood pressure 110/60 but thinks he's still in Afib.  Will discuss with Lawson Fiscal NP and call patient back

## 2011-06-06 NOTE — Telephone Encounter (Signed)
Advised patient and called lovenox to pharmacy

## 2011-06-07 ENCOUNTER — Ambulatory Visit (HOSPITAL_COMMUNITY)
Admission: RE | Admit: 2011-06-07 | Discharge: 2011-06-07 | Disposition: A | Discharge status: Home / Self Care | Payer: Medicare Other | Source: Ambulatory Visit | Attending: Cardiology | Admitting: Cardiology

## 2011-06-07 ENCOUNTER — Telehealth: Payer: Self-pay | Admitting: *Deleted

## 2011-06-07 ENCOUNTER — Encounter (HOSPITAL_COMMUNITY)
Admission: RE | Disposition: A | Discharge status: Home / Self Care | Payer: Self-pay | Source: Ambulatory Visit | Attending: Cardiology

## 2011-06-07 DIAGNOSIS — Z86718 Personal history of other venous thrombosis and embolism: Secondary | ICD-10-CM | POA: Insufficient documentation

## 2011-06-07 DIAGNOSIS — I10 Essential (primary) hypertension: Secondary | ICD-10-CM | POA: Insufficient documentation

## 2011-06-07 DIAGNOSIS — Z96659 Presence of unspecified artificial knee joint: Secondary | ICD-10-CM | POA: Insufficient documentation

## 2011-06-07 DIAGNOSIS — Z860100 Personal history of colon polyps, unspecified: Secondary | ICD-10-CM | POA: Insufficient documentation

## 2011-06-07 DIAGNOSIS — I4891 Unspecified atrial fibrillation: Secondary | ICD-10-CM | POA: Insufficient documentation

## 2011-06-07 DIAGNOSIS — I4510 Unspecified right bundle-branch block: Secondary | ICD-10-CM | POA: Insufficient documentation

## 2011-06-07 DIAGNOSIS — Z79899 Other long term (current) drug therapy: Secondary | ICD-10-CM | POA: Insufficient documentation

## 2011-06-07 DIAGNOSIS — E785 Hyperlipidemia, unspecified: Secondary | ICD-10-CM | POA: Insufficient documentation

## 2011-06-07 DIAGNOSIS — I4949 Other premature depolarization: Secondary | ICD-10-CM | POA: Insufficient documentation

## 2011-06-07 DIAGNOSIS — Z96649 Presence of unspecified artificial hip joint: Secondary | ICD-10-CM | POA: Insufficient documentation

## 2011-06-07 DIAGNOSIS — R9439 Abnormal result of other cardiovascular function study: Secondary | ICD-10-CM | POA: Insufficient documentation

## 2011-06-07 DIAGNOSIS — R5381 Other malaise: Secondary | ICD-10-CM | POA: Insufficient documentation

## 2011-06-07 DIAGNOSIS — R9430 Abnormal result of cardiovascular function study, unspecified: Secondary | ICD-10-CM

## 2011-06-07 DIAGNOSIS — Z01818 Encounter for other preprocedural examination: Secondary | ICD-10-CM

## 2011-06-07 DIAGNOSIS — Z7901 Long term (current) use of anticoagulants: Secondary | ICD-10-CM | POA: Insufficient documentation

## 2011-06-07 HISTORY — PX: LEFT HEART CATHETERIZATION WITH CORONARY ANGIOGRAM: SHX5451

## 2011-06-07 SURGERY — LEFT HEART CATHETERIZATION WITH CORONARY ANGIOGRAM
Anesthesia: LOCAL

## 2011-06-07 MED ORDER — FENTANYL CITRATE 0.05 MG/ML IJ SOLN
INTRAMUSCULAR | Status: AC
  Filled 2011-06-07: qty 2

## 2011-06-07 MED ORDER — SODIUM CHLORIDE 0.9 % IV SOLN: 250.0000 mL | INTRAVENOUS | Status: DC | PRN

## 2011-06-07 MED ORDER — NITROGLYCERIN 0.2 MG/ML ON CALL CATH LAB
INTRAVENOUS | Status: AC
  Filled 2011-06-07: qty 1

## 2011-06-07 MED ORDER — SODIUM CHLORIDE 0.9 % IV SOLN
INTRAVENOUS | Status: DC
  Administered 2011-06-07: 50 mL/h via INTRAVENOUS

## 2011-06-07 MED ORDER — VERAPAMIL HCL 2.5 MG/ML IV SOLN
INTRAVENOUS | Status: AC
  Filled 2011-06-07: qty 2

## 2011-06-07 MED ORDER — LIDOCAINE HCL (PF) 1 % IJ SOLN
INTRAMUSCULAR | Status: AC
  Filled 2011-06-07: qty 30

## 2011-06-07 MED ORDER — SODIUM CHLORIDE 0.9 % IJ SOLN: 3.0000 mL | Freq: Two times a day (BID) | INTRAMUSCULAR | Status: DC

## 2011-06-07 MED ORDER — SODIUM CHLORIDE 0.9 % IV SOLN: 1.0000 mL/kg/h | INTRAVENOUS | Status: DC

## 2011-06-07 MED ORDER — DIAZEPAM 5 MG PO TABS
10.0000 mg | ORAL_TABLET | ORAL | Status: AC
  Administered 2011-06-07: 10 mg via ORAL
  Filled 2011-06-07 (×2): qty 2

## 2011-06-07 MED ORDER — SODIUM CHLORIDE 0.9 % IJ SOLN: 3.0000 mL | INTRAMUSCULAR | Status: DC | PRN

## 2011-06-07 MED ORDER — HEPARIN SODIUM (PORCINE) 1000 UNIT/ML IJ SOLN
INTRAMUSCULAR | Status: AC
  Filled 2011-06-07: qty 1

## 2011-06-07 MED ORDER — MIDAZOLAM HCL 2 MG/2ML IJ SOLN
INTRAMUSCULAR | Status: AC
  Filled 2011-06-07: qty 2

## 2011-06-07 MED ORDER — HEPARIN (PORCINE) IN NACL 2-0.9 UNIT/ML-% IJ SOLN
INTRAMUSCULAR | Status: AC
  Filled 2011-06-07: qty 2000

## 2011-06-07 MED ORDER — ASPIRIN 81 MG PO CHEW
324.0000 mg | CHEWABLE_TABLET | ORAL | Status: AC
  Administered 2011-06-07: 324 mg via ORAL
  Filled 2011-06-07: qty 4

## 2011-06-07 MED ORDER — ONDANSETRON HCL 4 MG/2ML IJ SOLN: 4.0000 mg | Freq: Four times a day (QID) | INTRAMUSCULAR | Status: DC | PRN

## 2011-06-07 MED ORDER — ACETAMINOPHEN 325 MG PO TABS: 650.0000 mg | ORAL_TABLET | ORAL | Status: DC | PRN

## 2011-06-07 NOTE — Op Note (Signed)
Cardiac Catheterization Procedure Note  Name: Connor [name] MRN: [id number] DOB: 04-05-1934  Procedure: Left Heart Cath, Selective Coronary Angiography, LV angiography  Indication: 78 year old white male who presents with symptoms of fatigue. Stress Myoview study suggesting inferior wall ischemia.   Procedural Details: The right wrist was prepped, draped, and anesthetized with 1% lidocaine. Using the modified Seldinger technique, a 5 French sheath was introduced into the right radial artery. 3 mg of verapamil was administered through the sheath, weight-based unfractionated heparin was administered intravenously. Standard Judkins catheters were used for selective coronary angiography and left ventriculography. Catheter exchanges were performed over an exchange length guidewire. There were no immediate procedural complications. A TR band was used for radial hemostasis at the completion of the procedure.  The patient was transferred to the post catheterization recovery area for further monitoring.  Procedural Findings: Hemodynamics: AO 112/68 with a mean of 87 mmHg LV 113/10 mmHg  Coronary angiography: Coronary dominance: right  Left mainstem: Normal  Left anterior descending (LAD): Normal. First and second diagonal branches are normal.  Left circumflex (LCx): This is a large vessel that gives rise to an early first marginal branch and then a larger second marginal branch then bifurcates. It then terminates in the AV groove. It also gives rise to a large atrial branch. It is normal throughout.  Right coronary artery (RCA): Normal.  Left ventriculography: Left ventricular systolic function is low normal, LVEF is estimated at 50 %, there are frequent PVCs, there is no significant mitral regurgitation   Final Conclusions:   1. Normal coronary anatomy. 2. Low normal left ventricular function.  Recommendations: Continue medical therapy.  Peter Swaziland MD,FACC 06/07/2011, 7:57 PM

## 2011-06-07 NOTE — H&P (View-Only) (Signed)
Connor [name] Date of Birth: [birth year] Medical Record #[id number]  History of Present Illness: Connor [name] is seen back today for a follow up visit. He is seen with Dr. Patty Sermons. I recently saw him with the complaint of exertional fatigue that he thought was out of proportion. 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 There is mild inferior ischemia. He will be referred for cardiac catheterization. His study was not gated due to PVC's and PAC's. Last EF per echo was normal in 2009. He does have a history of atrial fib and had an ablation back in March. He has also had prior DVT. He is maintained on Xarelto.   He does not have chest pain. Has had exertional fatigue for some time. Not short of breath. Feels fine at rest. He does not feel like this is related to his atrial fib. His symptoms have persisted.   Current Outpatient Prescriptions on File Prior to Visit  Medication Sig Dispense Refill  . atorvastatin (LIPITOR) 10 MG tablet Take 10 mg by mouth daily.        . Calcium Carbonate (CALCIUM 500 PO) Take 500 mg by mouth 2 (two) times daily.       . Cholecalciferol (VITAMIN D3) 5000 UNITS TABS Take 5,000 Units by mouth daily.        Marland Kitchen diltiazem (CARDIZEM CD) 240 MG 24 hr capsule Take 1 capsule (240 mg total) by mouth daily.  30 capsule  11  . Docusate Calcium (STOOL SOFTENER PO) Take by mouth daily.       . metoprolol tartrate (LOPRESSOR) 50 MG tablet Take 1 tablet (50 mg total) by mouth 2 (two) times daily.  180 tablet  3  . Multiple Vitamins-Minerals (ICAPS PO) Take by mouth daily.        . Omega-3 Fatty Acids (OMEGA 3 PO) Take by mouth daily.        Marland Kitchen omeprazole (PRILOSEC) 20 MG capsule Take 20 mg by mouth daily.        . Rivaroxaban (XARELTO) 20 MG TABS Take 20 mg by mouth daily.  30 tablet  6  . Tamsulosin HCl (FLOMAX) 0.4 MG CAPS  Take 0.4 mg by mouth daily.          Allergies  Allergen Reactions  . Penicillins     Past Medical History  Diagnosis Date  . Hypertension   . Atrial flutter     s/p ablation in March 2012  . Hyperlipidemia   . Vitamin D deficiency   . RBBB (right bundle branch block)   . BPH (benign prostatic hyperplasia)   . Osteoarthritis   . Anemia   . Peptic ulcer disease   . DVT (deep venous thrombosis) 2/12    in setting of R hip surgery, he developed R femoral DVT  . Chronic anticoagulation     on Xarelto    Past Surgical History  Procedure Date  . Total hip arthroplasty 06/04/10    RIGHT HIP  . Total knee arthroplasty   . Transurethral resection of prostate   . Atrial flutter ablation 5/12    by JA    History  Smoking status  . Former Smoker  . Types: Cigarettes  . Quit date: 05/06/1978  Smokeless tobacco  . Never Used    History  Alcohol Use No    Family History  Problem  Relation Age of Onset  . Heart attack Father   . Lung disease Mother   . Atrial fibrillation Brother   . Arrhythmia Sister     Review of Systems: The review of systems is positive for exertional fatigue. No chest pain or shortness of breath. Has some palpitations.  All other systems were reviewed and are negative.  Physical Exam: Ht 6' (1.829 m)  Wt 216 lb (97.977 kg)  BMI 29.29 kg/m2 Patient is very pleasant and in no acute distress. Skin is warm and dry. Color is normal.  HEENT is unremarkable. Normocephalic/atraumatic. PERRL. Sclera are nonicteric. Neck is supple. No masses. No JVD. Lungs are clear. Cardiac exam shows a regular rate and rhythm. Abdomen is soft. Extremities are without edema. Gait and ROM are intact. No gross neurologic deficits noted.   LABORATORY DATA: EKG today shows sinus with a RBBB  Other labs are pending.    Assessment / Plan:

## 2011-06-07 NOTE — Telephone Encounter (Signed)
PT CALLED THIS AM WITH QUESTIONS RE MEDS PRIOR TO CATH   WAS WANDERING IF  LOVENOX WOULD BE ENOUGH  TO PREVENT STROKE PRIOR TO HEART CATH  CONT TO C/O ELEVATED HEART RATE   AS WELL . REVIEWED PT'S RECORD  AND BASED ON PREVIOUS MESSAGE  MD  STATED  1 STAT DOSE OF LOVENOX  WOULD BE ENOUGH AND TO TAKE  AN EXTRA  METOPROLOL AS  NEEDED  FOR RATE CONTROL IN FORMED PT OF ABOVE AND VERBALIZED UNDERSTANDING ALSO  WAS WANTING  DIRECTIONS  RE MEDS AFTER  CATH PT AWARE  COULD ASK DR Swaziland WHOM IS DOING CATH FOR  DIRECTIONS WITH REGARDS TO MEDS .

## 2011-06-07 NOTE — Interval H&P Note (Signed)
History and Physical Interval Note:  06/07/2011 7:31 PM  Connor [name]  has presented today for surgery, with the diagnosis of abnormal stress test  The various methods of treatment have been discussed with the patient and family. After consideration of risks, benefits and other options for treatment, the patient has consented to  Procedure(s): LEFT HEART CATHETERIZATION WITH CORONARY ANGIOGRAM as a surgical intervention .  The patients' history has been reviewed, patient examined, no change in status, stable for surgery.  I have reviewed the patients' chart and labs.  Questions were answered to the patient's satisfaction.     [name] [place] MD,FACC

## 2011-06-11 ENCOUNTER — Encounter: Payer: Self-pay | Admitting: Cardiology

## 2011-06-11 ENCOUNTER — Ambulatory Visit (INDEPENDENT_AMBULATORY_CARE_PROVIDER_SITE_OTHER): Payer: Medicare Other | Admitting: Cardiology

## 2011-06-11 VITALS — BP 123/70 | HR 72 | Resp 18 | Ht 72.0 in | Wt 219.8 lb

## 2011-06-11 DIAGNOSIS — I4892 Unspecified atrial flutter: Secondary | ICD-10-CM

## 2011-06-11 DIAGNOSIS — R5383 Other fatigue: Secondary | ICD-10-CM

## 2011-06-11 DIAGNOSIS — E785 Hyperlipidemia, unspecified: Secondary | ICD-10-CM

## 2011-06-11 DIAGNOSIS — R5381 Other malaise: Secondary | ICD-10-CM

## 2011-06-11 DIAGNOSIS — I10 Essential (primary) hypertension: Secondary | ICD-10-CM

## 2011-06-11 DIAGNOSIS — I480 Paroxysmal atrial fibrillation: Secondary | ICD-10-CM

## 2011-06-11 DIAGNOSIS — I4891 Unspecified atrial fibrillation: Secondary | ICD-10-CM

## 2011-06-11 NOTE — Patient Instructions (Signed)
Your physician recommends that you continue on your current medications as directed. Please refer to the Current Medication list given to you today. Your physician recommends that you schedule a follow-up appointment in: 2 months ov with Lawson Fiscal NP or  Dr. Patty Sermons

## 2011-06-11 NOTE — Assessment & Plan Note (Signed)
The patient has not had any recurrence of his atrial flutter fibrillation.  He will be seeing Dr. Johney Frame in followup for his arrhythmias in about a month.  2 days prior to his heart catheterization his pulse was running fast and he felt that he was probably back in atrial fibrillation.  However when he arrived for cardiac catheterization he was noted to be in sinus rhythm but with frequent PVCs.  Subsequently his PVCs have quieted down and he has not noticed them recently.

## 2011-06-11 NOTE — Assessment & Plan Note (Signed)
The patient's fatigue complaints seem to have been clearing without specific therapy.  As noted, all of his recent lab work was unremarkable.  He has started walking in the neighborhood again and has been walking up to a half a mile at a time with no symptoms.  He has not noted any dyspnea on exertion now or any excessive fatigue.

## 2011-06-11 NOTE — Assessment & Plan Note (Signed)
His blood pressure and pulse have been stable on current therapy.

## 2011-06-11 NOTE — Progress Notes (Signed)
Connor [name] Date of Birth:  Jan 15, 1934 [facility] [address] [place], [place]  [address] [phone]         Fax   [phone]  History of Present Illness: This pleasant elderly gentleman is seen for a scheduled followup office visit.  He has a past history of paroxysmal atrial fibrillation.  He had successful ablation of his atrial flutter fibrillation in May 2012.  Recently he was seen in our office complaining of lack of energy and feeling tired.  Blood work showed no cause for his symptoms.  We wondered about an ischemic equivalent and today nuclear stress test which suggested reversible inferior wall ischemia.  He went on to cardiac catheterization last week which showed clean coronaries and an ejection fraction of approximately 50% by catheter.  He was noted to be having frequent PVCs.  Current Outpatient Prescriptions  Medication Sig Dispense Refill  . atorvastatin (LIPITOR) 10 MG tablet Take 10 mg by mouth daily.        . Calcium Carbonate (CALCIUM 500 PO) Take 500 mg by mouth 2 (two) times daily.       . Cholecalciferol (VITAMIN D3) 5000 UNITS TABS Take 5,000 Units by mouth daily.        Marland Kitchen diltiazem (DILACOR XR) 240 MG 24 hr capsule Take 240 mg by mouth daily.      Connor [name] Calcium (STOOL SOFTENER PO) Take 1 capsule by mouth daily.       . metoprolol (LOPRESSOR) 50 MG tablet Take 50 mg by mouth 2 (two) times daily.      . Multiple Vitamin (MULITIVITAMIN) LIQD Take 5 mLs by mouth daily.      . Multiple Vitamins-Minerals (ICAPS PO) Take 1 tablet by mouth daily.       . Omega-3 Fatty Acids (OMEGA 3 PO) Take 1 capsule by mouth daily.       Marland Kitchen omeprazole (PRILOSEC) 20 MG capsule Take 20 mg by mouth daily.        . Rivaroxaban (XARELTO) 20 MG TABS Take 1 tablet by mouth daily.      . Tamsulosin HCl (FLOMAX) 0.4 MG CAPS Take 0.4 mg by mouth daily.          Allergies  Allergen Reactions  . Penicillins     Patient Active Problem List  Diagnoses  .  Atrial flutter  . Hyperlipidemia  . Hypertension  . Osteoarthritis  . BPH (benign prostatic hyperplasia)  . GERD (gastroesophageal reflux disease)  . Atrial fibrillation with rapid ventricular response  . PVC (premature ventricular contraction)  . Fatigue    History  Smoking status  . Former Smoker  . Types: Cigarettes  . Quit date: 05/06/1978  Smokeless tobacco  . Never Used    History  Alcohol Use No    Family History  Problem Relation Age of Onset  . Heart attack Father   . Lung disease Mother   . Atrial fibrillation Brother   . Arrhythmia Sister     Review of Systems: Constitutional: no fever chills diaphoresis or fatigue or change in weight.  Head and neck: no hearing loss, no epistaxis, no photophobia or visual disturbance. Respiratory: No cough, shortness of breath or wheezing. Cardiovascular: No chest pain peripheral edema, palpitations. Gastrointestinal: No abdominal distention, no abdominal pain, no change in bowel habits hematochezia or melena. Genitourinary: No dysuria, no frequency, no urgency, no nocturia. Musculoskeletal:No arthralgias, no back pain, no gait disturbance or myalgias. Neurological: No dizziness, no headaches, no  numbness, no seizures, no syncope, no weakness, no tremors. Hematologic: No lymphadenopathy, no easy bruising. Psychiatric: No confusion, no hallucinations, no sleep disturbance.    Physical Exam: Filed Vitals:   06/11/11 1038  BP: 123/70  Pulse: 72  Resp: 18   the general appearance reveals a well-developed well-nourished gentleman in no distress.The head and neck exam reveals pupils equal and reactive.  Extraocular movements are full.  There is no scleral icterus.  The mouth and pharynx are normal.  The neck is supple.  The carotids reveal no bruits.  The jugular venous pressure is normal.  The  thyroid is not enlarged.  There is no lymphadenopathy.  The chest is clear to percussion and auscultation.  There are no rales or  rhonchi.  Expansion of the chest is symmetrical.  The precordium is quiet.  The first heart sound is normal.  The second heart sound is physiologically split.  There is no murmur gallop rub or click.  There is no abnormal lift or heave.  The abdomen is soft and nontender.  The bowel sounds are normal.  The liver and spleen are not enlarged.  There are no abdominal masses.  There are no abdominal bruits.  Extremities reveal good pedal pulses.  There is no phlebitis or edema.  There is no cyanosis or clubbing.  Strength is normal and symmetrical in all extremities.  There is no lateralizing weakness.  There are no sensory deficits.  The skin is warm and dry.  There is no rash.     Assessment / Plan: Patient is to continue same medication.  He'll return in 2 months for followup office visit with Lawson Fiscal or myself.  Continue regular walking exercise.  I believe that he is deconditioned and this should improve with continued exercise.

## 2011-06-11 NOTE — Assessment & Plan Note (Signed)
The patient remains on low-dose Lipitor.  He was very pleased that his recent cardiac catheterization showed clean coronaries.

## 2011-06-14 ENCOUNTER — Telehealth: Payer: Self-pay | Admitting: Physician Assistant

## 2011-06-14 NOTE — Telephone Encounter (Signed)
Was paged by answering service regarding Mr. Kuhrt. Called back, he explained that he took his BP today which was 133/60, HR of 38. He states that he feels fine. More specifically, he denies lightheadedness, fatigue, weakness, shortness of breath, palpitations and chest pain. Takes Lopressor 50mg  BID. He has taken his morning dose. I advised to hold evening dose. Continue to monitor HR and BP. If he develops the above symptoms and BP does not improve, he will go to the ED.   Jacqulyn Bath, PA-C  06/14/2011 6:58 PM

## 2011-06-15 ENCOUNTER — Emergency Department (HOSPITAL_BASED_OUTPATIENT_CLINIC_OR_DEPARTMENT_OTHER)
Admission: EM | Admit: 2011-06-15 | Discharge: 2011-06-15 | Disposition: A | Discharge status: Home / Self Care | Payer: Medicare Other | Attending: Emergency Medicine | Admitting: Emergency Medicine

## 2011-06-15 ENCOUNTER — Encounter (HOSPITAL_BASED_OUTPATIENT_CLINIC_OR_DEPARTMENT_OTHER): Payer: Self-pay

## 2011-06-15 ENCOUNTER — Other Ambulatory Visit: Payer: Self-pay

## 2011-06-15 ENCOUNTER — Telehealth: Payer: Self-pay | Admitting: Physician Assistant

## 2011-06-15 DIAGNOSIS — I498 Other specified cardiac arrhythmias: Secondary | ICD-10-CM | POA: Insufficient documentation

## 2011-06-15 DIAGNOSIS — Z79899 Other long term (current) drug therapy: Secondary | ICD-10-CM | POA: Insufficient documentation

## 2011-06-15 DIAGNOSIS — I10 Essential (primary) hypertension: Secondary | ICD-10-CM | POA: Insufficient documentation

## 2011-06-15 DIAGNOSIS — R001 Bradycardia, unspecified: Secondary | ICD-10-CM

## 2011-06-15 DIAGNOSIS — Z86718 Personal history of other venous thrombosis and embolism: Secondary | ICD-10-CM | POA: Insufficient documentation

## 2011-06-15 DIAGNOSIS — E785 Hyperlipidemia, unspecified: Secondary | ICD-10-CM | POA: Insufficient documentation

## 2011-06-15 LAB — BASIC METABOLIC PANEL
CO2: 27 mEq/L (ref 19–32)
Chloride: 107 mEq/L (ref 96–112)
Glucose, Bld: 105 mg/dL — ABNORMAL HIGH (ref 70–99)

## 2011-06-15 NOTE — ED Provider Notes (Signed)
History     CSN: [id number]  Arrival date & time 06/15/11  1130   First MD Initiated Contact with Patient 06/15/11 1156      Chief Complaint  Patient presents with  . Hypertension    (Consider location/radiation/quality/duration/timing/severity/associated sxs/prior treatment) HPI Comments: Patient presents with concern over his blood pressure heart rate. She's been on metoprolol and diltiazem for several years. Last evening he felt to recheck his heart rate found to be 38. He called his cardiologist Dr. Lanell Matar and was told by his PA to skip his evening metoprolol dose which he did. This morning when he woke up as her rate was in the 70s. 30 minutes after taking his metoprolol and Cardizem his heart rate was again in the 30s. He called his cardiologist again is referred to the ED. He denies any chest pain, shortness of breath, lightheadedness, nausea, vomiting, dizziness. he denies any recent illnesses no fevers, vomiting or diarrhea. He denies any recent medication changes. Notably he did have a cardiac catheterization on February 1 that was normal.  The history is provided by the patient.    Past Medical History  Diagnosis Date  . Hypertension   . Atrial flutter     s/p ablation in March 2012  . Hyperlipidemia   . Vitamin d deficiency   . RBBB (right bundle branch block)   . BPH (benign prostatic hyperplasia)   . Osteoarthritis   . Anemia   . Peptic ulcer disease   . DVT (deep venous thrombosis) 2/12    in setting of R hip surgery, he developed R femoral DVT  . Chronic anticoagulation     on Xarelto  . Abnormal nuclear cardiac imaging test 1/13    Moderate size inferior and apical defect with partial reversibility in the inferior wall suggestive of prior inferior and apical infarct vs thinning; mild inferior ischemia. Not gated due to ectopy.     Past Surgical History  Procedure Date  . Total hip arthroplasty 06/04/10    RIGHT HIP  . Total knee arthroplasty   .  Transurethral resection of prostate   . Atrial flutter ablation 5/12    by JA  . Cardiac catheterization     Family History  Problem Relation Age of Onset  . Heart attack Father   . Lung disease Mother   . Atrial fibrillation Brother   . Arrhythmia Sister     History  Substance Use Topics  . Smoking status: Former Smoker    Types: Cigarettes    Quit date: 05/06/1978  . Smokeless tobacco: Never Used  . Alcohol Use: No      Review of Systems  Constitutional: Negative for fever, activity change and appetite change.  HENT: Negative for congestion and rhinorrhea.   Respiratory: Negative for cough, chest tightness and shortness of breath.   Gastrointestinal: Negative for nausea, vomiting and abdominal pain.  Genitourinary: Negative for dysuria and hematuria.  Musculoskeletal: Negative for back pain.  Skin: Negative for rash.  Neurological: Negative for dizziness, weakness, light-headedness and headaches.    Allergies  Penicillins  Home Medications   Current Outpatient Rx  Name Route Sig Dispense Refill  . ATORVASTATIN CALCIUM 10 MG PO TABS Oral Take 10 mg by mouth daily.      Marland Kitchen CALCIUM 500 PO Oral Take 500 mg by mouth 2 (two) times daily.     Marland Kitchen VITAMIN D3 5000 UNITS PO TABS Oral Take 5,000 Units by mouth daily.      Marland Kitchen DILTIAZEM  HCL ER 240 MG PO CP24 Oral Take 240 mg by mouth daily.    . STOOL SOFTENER PO Oral Take 1 capsule by mouth daily.     Marland Kitchen METOPROLOL TARTRATE 50 MG PO TABS Oral Take 50 mg by mouth 2 (two) times daily.    . ADULT MULTIVITAMIN LIQUID CH Oral Take 5 mLs by mouth daily.    . ICAPS PO Oral Take 1 tablet by mouth daily.     . OMEGA 3 PO Oral Take 1 capsule by mouth daily.     Marland Kitchen OMEPRAZOLE 20 MG PO CPDR Oral Take 20 mg by mouth daily.      Marland Kitchen RIVAROXABAN 20 MG PO TABS Oral Take 1 tablet by mouth daily.    Marland Kitchen TAMSULOSIN HCL 0.4 MG PO CAPS Oral Take 0.4 mg by mouth daily.        BP 106/70  Pulse 37  Temp(Src) 97.8 F (36.6 C) (Oral)  Resp 17  Ht 6'  (1.829 m)  Wt 213 lb (96.616 kg)  BMI 28.89 kg/m2  SpO2 97%  Physical Exam  Constitutional: He is oriented to person, place, and time. He appears well-developed and well-nourished. No distress.  HENT:  Head: Normocephalic and atraumatic.  Mouth/Throat: Oropharynx is clear and moist. No oropharyngeal exudate.  Eyes: Conjunctivae are normal. Pupils are equal, round, and reactive to light.  Neck: Normal range of motion.  Cardiovascular: Normal rate and normal heart sounds.        Irregular rhythm  Pulmonary/Chest: Effort normal and breath sounds normal. No respiratory distress.  Abdominal: Soft. Bowel sounds are normal.  Musculoskeletal: Normal range of motion. He exhibits no edema and no tenderness.  Neurological: He is alert and oriented to person, place, and time. No cranial nerve deficit.  Skin: Skin is warm.    ED Course  Procedures (including critical care time)  Labs Reviewed  BASIC METABOLIC PANEL - Abnormal; Notable for the following:    Glucose, Bld 105 (*)    All other components within normal limits  TROPONIN I   No results found.   No diagnosis found.    MDM  A symptomatic bradycardia.  No chest pain, shortness of breath, lightheadedness or dizziness.  Patient's heart rate has been in the 60s with frequent PVCs. Remains asymptomatic. He did have several instances where his heart rate drifted to the 30s and then came back up in a few seconds. He remains asymptomatic.  I discussed the patient's presentation with Dr. Gala Romney who was covering for Dr. Patty Sermons.  He agrees to discontinue the patient's metoprolol until he is seen in the office next week. Will continue the diltiazem.   Date: 06/15/2011  Rate: 65  Rhythm: normal sinus rhythm and premature ventricular contractions (PVC)  QRS Axis: normal  Intervals: normal  ST/T Wave abnormalities: normal  Conduction Disutrbances:right bundle branch block  Narrative Interpretation: Multiple PVCs, biphasic T waves  V3  Old EKG Reviewed: changes noted    Date: 06/15/2011  Rate: 65  Rhythm: normal sinus rhythm  QRS Axis: normal  Intervals: normal  ST/T Wave abnormalities: normal  Conduction Disutrbances:right bundle branch block  Narrative Interpretation: frequent PVCs  Old EKG Reviewed: unchanged         Glynn Octave, MD 06/15/11 1323

## 2011-06-15 NOTE — ED Notes (Signed)
Pt states that he wants to have his blood pressure checked, and his pulse.  Pt states that he has hx of afib and heart cath Friday a week ago.  Pt states that he is taking metoprolol and diltiazem.  Pt was advised to skip metoprolol last night which he did and his HR returned to normal per pt.  Pt states that he took his metoprolol and diltiazem this morning and his HR 30 post med was 35.

## 2011-06-15 NOTE — Telephone Encounter (Signed)
HR last night 38 Called PA Held evening dose of Metoprolol HR improved This am:  HR 73 and BP 137/67 Took Metoprolol 50 and Dilt 240 mg and 0:30 later and HR 37, 35 (sitting) and HR 77 with standing No dizzy, lightheaded, no near syncope, no chest pain, SOB.  Feels good. No recent viral illnesses or vomiting or diarrhea.  Had URI few weeks ago.   I have asked him to go to an urgent care to have an ECG and BP check with his machine.  He agrees.  If HR really is in the 30s, he will hold metoprolol until we can get him in to office for follow up.  Please call patient Monday 06/17/11 for follow up. Connor Newcomer, PA-C  10:33 AM 06/15/2011

## 2011-06-17 ENCOUNTER — Ambulatory Visit (INDEPENDENT_AMBULATORY_CARE_PROVIDER_SITE_OTHER): Payer: Medicare Other | Admitting: Physician Assistant

## 2011-06-17 ENCOUNTER — Encounter: Payer: Self-pay | Admitting: Physician Assistant

## 2011-06-17 VITALS — BP 122/70 | HR 90 | Ht 72.0 in | Wt 220.8 lb

## 2011-06-17 DIAGNOSIS — R001 Bradycardia, unspecified: Secondary | ICD-10-CM

## 2011-06-17 DIAGNOSIS — I498 Other specified cardiac arrhythmias: Secondary | ICD-10-CM

## 2011-06-17 MED ORDER — METOPROLOL TARTRATE 50 MG PO TABS: ORAL_TABLET | ORAL | Status: DC

## 2011-06-17 NOTE — Progress Notes (Signed)
HPI:  This is a 78 year old white male patient who has history of paroxysmal atrial for ablation and underwent successful ablation for his atrial flutter in May 2012. He recently had a cardiac catheter after nuclear stress test suggested reversible inferior wall ischemia. Cardiac catheterization showed clean coronary arteries ejection fraction 50%. He also has frequent PVCs.  The patient has been taking Cardizem and metoprolol for over a year for paroxysmal atrial fibrillation. On Friday he took his blood pressure and his heart rate was down to 30. He held his metoprolol and ended up going to the [facility] emergency room on [address]. They asked him to stop his metoprolol completely because of heart rate fluctuations down into the 30s. He comes in today for followup.  The patient's heart rate has been stable since he's been off the metoprolol. He does have mild weakness. The patient is worried about rapid heart rates which she's had in the past.   Allergies  Allergen Reactions  . Penicillins     Current Outpatient Prescriptions on File Prior to Visit  Medication Sig Dispense Refill  . atorvastatin (LIPITOR) 10 MG tablet Take 10 mg by mouth daily.        . Calcium Carbonate (CALCIUM 500 PO) Take 500 mg by mouth 2 (two) times daily.       . Cholecalciferol (VITAMIN D3) 5000 UNITS TABS Take 5,000 Units by mouth daily.        Marland Kitchen diltiazem (DILACOR XR) 240 MG 24 hr capsule Take 240 mg by mouth daily.      [name] Calcium (STOOL SOFTENER PO) Take 1 capsule by mouth daily.       . metoprolol (LOPRESSOR) 50 MG tablet Take 50 mg by mouth 2 (two) times daily.      . Multiple Vitamin (MULITIVITAMIN) LIQD Take 5 mLs by mouth daily.      . Multiple Vitamins-Minerals (ICAPS PO) Take 1 tablet by mouth daily.       . Omega-3 Fatty Acids (OMEGA 3 PO) Take 1 capsule by mouth daily.       Marland Kitchen omeprazole (PRILOSEC) 20 MG capsule Take 20 mg by mouth daily.        . Rivaroxaban (XARELTO) 20 MG TABS Take 1 tablet by  mouth daily.      . Tamsulosin HCl (FLOMAX) 0.4 MG CAPS Take 0.4 mg by mouth daily.          Past Medical History  Diagnosis Date  . Hypertension   . Atrial flutter     s/p ablation in March 2012  . Hyperlipidemia   . Vitamin d deficiency   . RBBB (right bundle branch block)   . BPH (benign prostatic hyperplasia)   . Osteoarthritis   . Anemia   . Peptic ulcer disease   . DVT (deep venous thrombosis) 2/12    in setting of R hip surgery, he developed R femoral DVT  . Chronic anticoagulation     on Xarelto  . Abnormal nuclear cardiac imaging test 1/13    Moderate size inferior and apical defect with partial reversibility in the inferior wall suggestive of prior inferior and apical infarct vs thinning; mild inferior ischemia. Not gated due to ectopy.     Past Surgical History  Procedure Date  . Total hip arthroplasty 06/04/10    RIGHT HIP  . Total knee arthroplasty   . Transurethral resection of prostate   . Atrial flutter ablation 5/12    by JA  . Cardiac catheterization  Family History  Problem Relation Age of Onset  . Heart attack Father   . Lung disease Mother   . Atrial fibrillation Brother   . Arrhythmia Sister     History   Social History  . Marital Status: Married    Spouse Name: N/A    Number of Children: N/A  . Years of Education: N/A   Occupational History  . Not on file.   Social History Main Topics  . Smoking status: Former Smoker    Types: Cigarettes    Quit date: 05/06/1978  . Smokeless tobacco: Never Used  . Alcohol Use: No  . Drug Use: No  . Sexually Active: Not on file   Other Topics Concern  . Not on file   Social History Narrative   he patient lives in [place] with his spouse    ROS:   PHYSICAL EXAM: Well-nournished, in no acute distress. Neck: No JVD, HJR, Bruit, or thyroid enlargement Lungs: No tachypnea, clear without wheezing, rales, or rhonchi Cardiovascular: RRR, 2/6 systolic murmur at the left sternal border, no  gallops, bruit, thrill, or heave. Abdomen: BS normal. Soft without organomegaly, masses, lesions or tenderness. Extremities: without cyanosis, clubbing or edema. Good distal pulses bilateral SKin: Warm, no lesions or rashes  Musculoskeletal: No deformities Neuro: no focal signs  BP 122/70  Pulse 90  Ht 6' (1.829 m)  Wt 220 lb 12.8 oz (100.154 kg)  BMI 29.95 kg/m2   [id number] sinus rhythm with PVCs, right bundle branch block, and nonspecific T-wave changes

## 2011-06-17 NOTE — Assessment & Plan Note (Signed)
Stable

## 2011-06-17 NOTE — Assessment & Plan Note (Signed)
This is a chronic problem for this patient

## 2011-06-17 NOTE — Telephone Encounter (Signed)
CALLED PT THIS AM  RE MESSAGE FROM [facility] PAC  PT  STATES HEART RATE HAS IMPROVED TO 70'S  OVER WEEKEND  RAN  IN THE LOW 30'S  B/P RUNNING [id number]/57-80 IS COMPLAINING OF  LIGHTHEADEDNESS AND DIZZINESS  IS TAKING  METOPROLOL 50 MG BID  PT WISHES TO BE SEEN  DR BRACKBILL FIRST AVAILABLE  ISN'T TIL April  APPT MADE WITH MICHELE LENZE PAC  FOR TODAY AT  12:00 .Connor [name]

## 2011-06-17 NOTE — Assessment & Plan Note (Signed)
Patient has history of atrial fibrillation with rapid ventricular response. He is currently in normal sinus rhythm and has had heart rates down in the 30s on diltiazem and metoprolol. I will resume low-dose metoprolol at 25 mg b.i.d. He is asked to hold the dose if his heart rate is less than 50. If he has resting heart rate greater than 100 he can take an extra 25 mg of metoprolol. We will make him an appointment to see Dr. Johney Frame back in the next week or so.

## 2011-06-17 NOTE — Patient Instructions (Addendum)
Your physician has recommended you make the following change in your medication: Change your Lopressor to 25 mg twice daily.  This will be a half a tablet of the 50 mg you have twice daily.  HOLD IF heart rate is less than 50 and TAKE EXTRA 25 IF heart rate is higher than 100 at rest.     Your physician recommends that you see Dr. Johney Frame back July 08, 2011 at 3:00pm.

## 2011-06-18 ENCOUNTER — Ambulatory Visit: Payer: Medicare Other | Admitting: Nurse Practitioner

## 2011-06-18 NOTE — Telephone Encounter (Signed)
Patient actually saw [facility] yesterday and stated he was feeling better.  Dr. Patty Sermons reviewed note and agreed with her plan.  Advised patient and he will call back if any more problems

## 2011-06-18 NOTE — Telephone Encounter (Signed)
Okay to work in some time.

## 2011-06-19 ENCOUNTER — Other Ambulatory Visit: Payer: Self-pay | Admitting: *Deleted

## 2011-06-19 ENCOUNTER — Telehealth: Payer: Self-pay | Admitting: Cardiology

## 2011-06-19 NOTE — Telephone Encounter (Signed)
The patient has known bigeminy PVCs at times.  I suspect that when his rate suddenly drops it is because he is having bigeminy PVCs and the PVCs are not being detected.  If these findings persist we can have him come in while it is occurring and perform an EKG or consider having the patient wear a 24 hour or 48 hour Holter monitor to evaluate his symptoms

## 2011-06-19 NOTE — Telephone Encounter (Signed)
New msg: Pt calling wanting to speak with nurse regarding pt heart fluctuating from high rate to low rate. Pt would like to know how he should proceed.   Please return pt call to discuss further.

## 2011-06-19 NOTE — Telephone Encounter (Signed)
Fu call Patient calling back again 

## 2011-06-19 NOTE — Telephone Encounter (Signed)
Will forward to  Dr. Brackbill for review 

## 2011-06-19 NOTE — Telephone Encounter (Signed)
Advised patient and scheduled appointment with  Dr. Patty Sermons in the morning

## 2011-06-20 ENCOUNTER — Encounter: Payer: Self-pay | Admitting: Cardiology

## 2011-06-20 ENCOUNTER — Ambulatory Visit (INDEPENDENT_AMBULATORY_CARE_PROVIDER_SITE_OTHER): Payer: Medicare Other | Admitting: Cardiology

## 2011-06-20 VITALS — BP 144/92 | HR 48 | Ht 72.0 in | Wt 215.0 lb

## 2011-06-20 DIAGNOSIS — I4949 Other premature depolarization: Secondary | ICD-10-CM

## 2011-06-20 DIAGNOSIS — I10 Essential (primary) hypertension: Secondary | ICD-10-CM

## 2011-06-20 DIAGNOSIS — I493 Ventricular premature depolarization: Secondary | ICD-10-CM

## 2011-06-20 DIAGNOSIS — I4892 Unspecified atrial flutter: Secondary | ICD-10-CM

## 2011-06-20 NOTE — Assessment & Plan Note (Signed)
He reports that his recent blood pressures at home have been in the 150/80 range.  However I checked his blood pressure myself today and got 110/70 bilaterally.

## 2011-06-20 NOTE — Assessment & Plan Note (Signed)
The patient has had no recurrence of atrial flutter fibrillation.  He is on long-term anticoagulation with Xarelto.  He has not had any TIA or stroke symptoms.

## 2011-06-20 NOTE — Patient Instructions (Signed)
Your physician recommends that you continue on your current medications as directed. Please refer to the Current Medication list given to you today. Keep your scheduled appointments Increase your walking

## 2011-06-20 NOTE — Progress Notes (Signed)
Connor [name] Date of Birth:  Apr 19, 1934 [facility] [address] [place], [place]  [address] [phone]         Fax   [phone]  History of Present Illness: This pleasant 78 year old gentleman is seen for a work in office visit.  He has been very concerned about wide fluctuations in his blood pressure and his pulse reading.  He has a machine at home which has shown that his heart rate will change abruptly from 75 down to 37 and back again.  His blood pressure readings have also been somewhat erratic.  He has not been doing any regular exercise because he has been very concerned about his heart.  He does have a history of known asymptomatic PVCs in the past.  He has been avoiding caffeine and chocolate.  He does not have any history of heart disease.  He had a recent abnormal nuclear stress test which led to a cardiac catheterization.  The cardiac catheterization did not show any significant coronary artery disease.  The patient has a past history of atrial flutter and has a past history of a ablation procedure by Dr. Johney Frame.  He is scheduled to see Dr. Johney Frame for a followup visit next month.  Current Outpatient Prescriptions  Medication Sig Dispense Refill  . atorvastatin (LIPITOR) 10 MG tablet Take 10 mg by mouth daily.        . Calcium Carbonate (CALCIUM 500 PO) Take 500 mg by mouth 2 (two) times daily.       . Cholecalciferol (VITAMIN D3) 5000 UNITS TABS Take 5,000 Units by mouth daily.        Marland Kitchen diltiazem (DILACOR XR) 240 MG 24 hr capsule Take 240 mg by mouth daily.      [name] Calcium (STOOL SOFTENER PO) Take 1 capsule by mouth daily.       . metoprolol (LOPRESSOR) 50 MG tablet Take 25 mg twice daily; HOLD if heart rate is less than 50 bpm.  Take and extra 25 mg if resting heart rate is greater than 100 bpm  45 tablet  3  . Multiple Vitamin (MULITIVITAMIN) LIQD Take 5 mLs by mouth daily.      . Multiple Vitamins-Minerals (ICAPS PO) Take 1 tablet by mouth  daily.       . Omega-3 Fatty Acids (OMEGA 3 PO) Take 1 capsule by mouth daily.       Marland Kitchen omeprazole (PRILOSEC) 20 MG capsule Take 20 mg by mouth daily.        . Rivaroxaban (XARELTO) 20 MG TABS Take 1 tablet by mouth daily.      . Tamsulosin HCl (FLOMAX) 0.4 MG CAPS Take 0.4 mg by mouth daily.          Allergies  Allergen Reactions  . Penicillins     Patient Active Problem List  Diagnoses  . Atrial flutter  . Hyperlipidemia  . Hypertension  . Osteoarthritis  . BPH (benign prostatic hyperplasia)  . GERD (gastroesophageal reflux disease)  . Atrial fibrillation with rapid ventricular response  . PVC (premature ventricular contraction)  . Fatigue    History  Smoking status  . Former Smoker  . Types: Cigarettes  . Quit date: 05/06/1978  Smokeless tobacco  . Never Used    History  Alcohol Use No    Family History  Problem Relation Age of Onset  . Heart attack Father   . Lung disease Mother   . Atrial fibrillation Brother   . Arrhythmia  Sister     Review of Systems: Constitutional: no fever chills diaphoresis or fatigue or change in weight.  Head and neck: no hearing loss, no epistaxis, no photophobia or visual disturbance. Respiratory: No cough, shortness of breath or wheezing. Cardiovascular: No chest pain peripheral edema, palpitations. Gastrointestinal: No abdominal distention, no abdominal pain, no change in bowel habits hematochezia or melena. Genitourinary: No dysuria, no frequency, no urgency, no nocturia. Musculoskeletal:No arthralgias, no back pain, no gait disturbance or myalgias. Neurological: No dizziness, no headaches, no numbness, no seizures, no syncope, no weakness, no tremors. Hematologic: No lymphadenopathy, no easy bruising. Psychiatric: No confusion, no hallucinations, no sleep disturbance.    Physical Exam: Filed Vitals:   06/20/11 1043  BP: 144/92  Pulse: 48   on recheck by me his blood pressure was 110/70 bilaterally sitting using a  standard cuff size.  His heart rate is 65 by EKG with occasional PVC.Pupils equal and reactive.   Extraocular Movements are full.  There is no scleral icterus.  The mouth and pharynx are normal.  The neck is supple.  The carotids reveal no bruits.  The jugular venous pressure is normal.  The thyroid is not enlarged.  There is no lymphadenopathy.  The chest is clear to percussion and auscultation. There are no rales or rhonchi. Expansion of the chest is symmetrical.  The precordium is quiet.  The first heart sound is normal.  The second heart sound is physiologically split.  There is no murmur gallop rub or click.  There is no abnormal lift or heave.  The abdomen is soft and nontender. Bowel sounds are normal. The liver and spleen are not enlarged. There Are no abdominal masses. There are no bruits.  The pedal pulses are good.  There is no phlebitis or edema.  There is no cyanosis or clubbing. Neurologic is within normal limits  EKG today shows normal sinus rhythm at 65 per minute with occasional PVCs and he has a pattern of her right bundle branch block which is old.   Assessment / Plan:  Continue current medication which includes diltiazem SR 240 mg once a day and metoprolol 25 mg twice a day. Keep appointment with Dr. Johney Frame in March and keep appointment to see Lawson Fiscal or myself in April.

## 2011-06-20 NOTE — Assessment & Plan Note (Signed)
The EKG and rhythm strip today demonstrate that he is in a normal sinus rhythm at 65 per minute.  However he is having occasional unifocal PVCs with a compensatory pause.  I went over his EKG with him and his wife in great detail.  I explained that the compensatory pause following the PVC was fooling his monitor into thinking that his overall heart rate was only 37 or so.  We discussed have a heart rate monitor is not really reliable if he is having frequent PVCs.  We talked about how the PVCs are benign and don't require any specific therapy and often go away by themselves.  I've encouraged him to get back into a regular walking program and to spend less time checking his blood pressure and pulse.

## 2011-07-08 ENCOUNTER — Ambulatory Visit: Payer: Medicare Other | Admitting: Internal Medicine

## 2011-07-15 ENCOUNTER — Ambulatory Visit (INDEPENDENT_AMBULATORY_CARE_PROVIDER_SITE_OTHER): Payer: Medicare Other | Admitting: Internal Medicine

## 2011-07-15 ENCOUNTER — Encounter: Payer: Self-pay | Admitting: Internal Medicine

## 2011-07-15 DIAGNOSIS — I493 Ventricular premature depolarization: Secondary | ICD-10-CM

## 2011-07-15 DIAGNOSIS — I4949 Other premature depolarization: Secondary | ICD-10-CM

## 2011-07-15 DIAGNOSIS — I4891 Unspecified atrial fibrillation: Secondary | ICD-10-CM

## 2011-07-15 DIAGNOSIS — I480 Paroxysmal atrial fibrillation: Secondary | ICD-10-CM

## 2011-07-15 DIAGNOSIS — I10 Essential (primary) hypertension: Secondary | ICD-10-CM

## 2011-07-15 NOTE — Progress Notes (Signed)
PCP:  Reather Littler, MD, MD Primary Cardiologist:  Dr Patty Sermons  The patient presents today for electrophysiology followup.  Since last being seen by me, he has been diagnosed with afib.  He presented with fatigue 9/12 and had an ekg performed by Dr [place] which documented afib.  He was initiated on xarelto.  He has occasional PVCs for which he is minimally symptomatic.  He has occasional "low heart rate" readings on his BP machine and finds that when he actually checks his pulse, it is 60s-70s.  He felt that he was in afib when he went for cath recently, but was told by Dr Sw[place] that he was in sinus rhythm at that time.  He feels that his rhythm is mostly stable.  His energy and exercise tolerance continue to improve.   Today, he denies symptoms of palpitations, chest pain, shortness of breath, orthopnea, PND, lower extremity edema (above baseline), dizziness, presyncope, syncope, or neurologic sequela.  The patient feels that he is tolerating medications without difficulties and is otherwise without complaint today.   Past Medical History  Diagnosis Date  . Hypertension   . Atrial flutter     s/p ablation in March 2012  . Hyperlipidemia   . Vitamin d deficiency   . RBBB (right bundle branch block)   . BPH (benign prostatic hyperplasia)   . Osteoarthritis   . Anemia   . Peptic ulcer disease   . DVT (deep venous thrombosis) 2/12    in setting of R hip surgery, he developed R femoral DVT  . Chronic anticoagulation     on Xarelto fo afib  . Abnormal nuclear cardiac imaging test 1/13    Moderate size inferior and apical defect with partial reversibility in the inferior wall suggestive of prior inferior and apical infarct vs thinning; mild inferior ischemia. Not gated due to ectopy.   . Paroxysmal atrial fibrillation 9/12    documented by Dr Otho Perl on office EKG 9/12  . Premature ventricular contraction    Past Surgical History  Procedure Date  . Total hip arthroplasty 06/04/10    RIGHT HIP  .  Total knee arthroplasty   . Transurethral resection of prostate   . Atrial flutter ablation 5/12    by JA  . Cardiac catheterization     Current Outpatient Prescriptions  Medication Sig Dispense Refill  . atorvastatin (LIPITOR) 10 MG tablet Take 10 mg by mouth daily.        . Calcium Carbonate (CALCIUM 500 PO) Take 500 mg by mouth 2 (two) times daily.       . Cholecalciferol (VITAMIN D3) 5000 UNITS TABS Take 5,000 Units by mouth daily.        Marland Kitchen diltiazem (DILACOR XR) 240 MG 24 hr capsule Take 240 mg by mouth daily.      [name] Calcium (STOOL SOFTENER PO) Take 1 capsule by mouth daily.       . metoprolol (LOPRESSOR) 50 MG tablet Take 25 mg twice daily; HOLD if heart rate is less than 50 bpm.  Take and extra 25 mg if resting heart rate is greater than 100 bpm  45 tablet  3  . Multiple Vitamin (MULITIVITAMIN) LIQD Take 5 mLs by mouth daily.      . Multiple Vitamins-Minerals (ICAPS PO) Take 1 tablet by mouth daily.       . Omega-3 Fatty Acids (OMEGA 3 PO) Take 1 capsule by mouth daily.       Marland Kitchen omeprazole (PRILOSEC) 20 MG capsule Take  20 mg by mouth daily.        . Rivaroxaban (XARELTO) 20 MG TABS Take 1 tablet by mouth daily.      . Tamsulosin HCl (FLOMAX) 0.4 MG CAPS Take 0.4 mg by mouth daily.          Allergies  Allergen Reactions  . Penicillins     History   Social History  . Marital Status: Married    Spouse Name: N/A    Number of Children: N/A  . Years of Education: N/A   Occupational History  . Not on file.   Social History Main Topics  . Smoking status: Former Smoker    Types: Cigarettes    Quit date: 05/06/1978  . Smokeless tobacco: Never Used  . Alcohol Use: No  . Drug Use: No  . Sexually Active: Not on file   Other Topics Concern  . Not on file   Social History Narrative   he patient lives in [place] with his spouse    Family History  Problem Relation Age of Onset  . Heart attack Father   . Lung disease Mother   . Atrial fibrillation Brother     . Arrhythmia Sister     ROS-  All systems are reviewed and are negative except as outlined in the HPI above  Physical Exam: Filed Vitals:   07/15/11 1104  BP: 136/54  Pulse: 68  Resp: 18  Height: 6' (1.829 m)  Weight: 217 lb 12.8 oz (98.793 kg)    GEN- The patient is well appearing, alert and oriented x 3 today.   Head- normocephalic, atraumatic Eyes-  Sclera clear, conjunctiva pink Ears- hearing intact Oropharynx- clear Lungs- Clear to ausculation bilaterally, normal work of breathing Heart- Regular rate and rhythm, no murmurs, rubs or gallops, PMI not laterally displaced GI- soft, NT, ND, + BS Extremities- no clubbing, cyanosis, trace R>L edema MS- no significant deformity or atrophy Skin- no rash or lesion Psych- euthymic mood, full affect Neuro- strength and sensation are intact  Assessment and Plan:

## 2011-07-15 NOTE — Assessment & Plan Note (Signed)
Stable No change required today  

## 2011-07-15 NOTE — Patient Instructions (Signed)
Your physician wants you to follow-up in: 6 months with Dr. Allred. You will receive a reminder letter in the mail two months in advance. If you don't receive a letter, please call our office to schedule the follow-up appointment.  

## 2011-07-15 NOTE — Assessment & Plan Note (Signed)
Stable/ asymptomatic Continue metoprolol at low dosing

## 2011-07-15 NOTE — Assessment & Plan Note (Signed)
Presently, stable off of AAD.  He is appropriately anticoagulted with xarelto. Dr Patty Sermons to follow creatinine clearance on this medicine for dosing.  If his afib burden increases then we should consider an antiarrhythmic medicine. Options would include flecainide (would use 50mg  dosing given RBBB) or tikosyn Will monitor for now.

## 2011-08-12 ENCOUNTER — Other Ambulatory Visit: Payer: Self-pay | Admitting: *Deleted

## 2011-08-12 MED ORDER — RIVAROXABAN 20 MG PO TABS: 1.0000 | ORAL_TABLET | Freq: Every day | ORAL | Status: DC

## 2011-08-13 ENCOUNTER — Ambulatory Visit: Payer: Medicare Other | Admitting: Nurse Practitioner

## 2011-09-11 ENCOUNTER — Encounter: Payer: Self-pay | Admitting: Cardiology

## 2011-09-11 ENCOUNTER — Ambulatory Visit (INDEPENDENT_AMBULATORY_CARE_PROVIDER_SITE_OTHER): Payer: Medicare Other | Admitting: Cardiology

## 2011-09-11 VITALS — BP 118/70 | HR 60 | Ht 72.0 in | Wt 218.0 lb

## 2011-09-11 DIAGNOSIS — I4949 Other premature depolarization: Secondary | ICD-10-CM

## 2011-09-11 DIAGNOSIS — I119 Hypertensive heart disease without heart failure: Secondary | ICD-10-CM

## 2011-09-11 DIAGNOSIS — I10 Essential (primary) hypertension: Secondary | ICD-10-CM

## 2011-09-11 DIAGNOSIS — I493 Ventricular premature depolarization: Secondary | ICD-10-CM

## 2011-09-11 DIAGNOSIS — M545 Low back pain, unspecified: Secondary | ICD-10-CM

## 2011-09-11 DIAGNOSIS — I4892 Unspecified atrial flutter: Secondary | ICD-10-CM

## 2011-09-11 DIAGNOSIS — E7800 Pure hypercholesterolemia, unspecified: Secondary | ICD-10-CM

## 2011-09-11 DIAGNOSIS — M1990 Unspecified osteoarthritis, unspecified site: Secondary | ICD-10-CM

## 2011-09-11 MED ORDER — METOPROLOL SUCCINATE ER 50 MG PO TB24: ORAL_TABLET | ORAL | Status: DC

## 2011-09-11 NOTE — Assessment & Plan Note (Signed)
His blood pressure has been maintaining in normal levels except for an occasional outliner which is slightly high.

## 2011-09-11 NOTE — Progress Notes (Signed)
Connor [name] Date of Birth:  [birth year] [facility] [address] [place], [place]  [address] [phone]         Fax   [phone]  History of Present Illness: This pleasant 78 year old gentleman is seen for a scheduled followup office visit to has a past history of paroxysmal atrial fib.  He is on long-term Xarelto.  He has had a previous ablation procedure by Dr. Johney Frame for his atrial flutter.  The patient does not have any history of ischemic heart disease.  He did have a previously abnormal nuclear stress test leading to cardiac catheterization which did not show any significant coronary artery disease.  The patient has a history of high blood pressure and hypercholesterolemia.  He has a known chronic right bundle branch block on EKG.  Had a past history of DVT in the setting of right hip surgery.  Current Outpatient Prescriptions  Medication Sig Dispense Refill  . atorvastatin (LIPITOR) 10 MG tablet Take 10 mg by mouth daily.        . Calcium Carbonate (CALCIUM 500 PO) Take 500 mg by mouth 2 (two) times daily.       . Cholecalciferol (VITAMIN D3) 5000 UNITS TABS Take 5,000 Units by mouth daily.        Marland Kitchen diltiazem (DILACOR XR) 240 MG 24 hr capsule Take 240 mg by mouth daily.      [name] Calcium (STOOL SOFTENER PO) Take 1 capsule by mouth daily.       . methocarbamol (ROBAXIN) 500 MG tablet Take 500 mg by mouth 3 (three) times daily.      . Multiple Vitamin (MULITIVITAMIN) LIQD Take 5 mLs by mouth daily.      . Multiple Vitamins-Minerals (ICAPS PO) Take 1 tablet by mouth daily.       . Omega-3 Fatty Acids (OMEGA 3 PO) Take 1 capsule by mouth daily.       Marland Kitchen omeprazole (PRILOSEC) 20 MG capsule Take 20 mg by mouth daily.        . Rivaroxaban (XARELTO) 20 MG TABS Take 1 tablet by mouth daily.  30 tablet  7  . Tamsulosin HCl (FLOMAX) 0.4 MG CAPS Take 0.4 mg by mouth daily.        . traMADol (ULTRAM) 50 MG tablet Take 50 mg by mouth every 6 (six) hours as needed.       . metoprolol succinate (TOPROL-XL) 50 MG 24 hr tablet Take 1 tablet in the morning and 1/2 in the evening  135 tablet  3    Allergies  Allergen Reactions  . Penicillins     Patient Active Problem List  Diagnoses  . Atrial flutter  . Hyperlipidemia  . Hypertension  . Osteoarthritis  . BPH (benign prostatic hyperplasia)  . GERD (gastroesophageal reflux disease)  . Paroxysmal atrial fibrillation  . PVC (premature ventricular contraction)  . Fatigue  . Low back pain    History  Smoking status  . Former Smoker  . Types: Cigarettes  . Quit date: 05/06/1978  Smokeless tobacco  . Never Used    History  Alcohol Use No    Family History  Problem Relation Age of Onset  . Heart attack Father   . Lung disease Mother   . Atrial fibrillation Brother   . Arrhythmia Sister     Review of Systems: Constitutional: no fever chills diaphoresis or fatigue or change in weight.  Head and neck: no hearing loss, no epistaxis, no photophobia or  visual disturbance. Respiratory: No cough, shortness of breath or wheezing. Cardiovascular: No chest pain peripheral edema, palpitations. Gastrointestinal: No abdominal distention, no abdominal pain, no change in bowel habits hematochezia or melena. Genitourinary: No dysuria, no frequency, no urgency, no nocturia. Musculoskeletal:No arthralgias, no back pain, no gait disturbance or myalgias. Neurological: No dizziness, no headaches, no numbness, no seizures, no syncope, no weakness, no tremors. Hematologic: No lymphadenopathy, no easy bruising. Psychiatric: No confusion, no hallucinations, no sleep disturbance.    Physical Exam: Filed Vitals:   09/11/11 1055  BP: 118/70  Pulse: 60   the general appearance reveals a well-developed well-nourished gentleman in no distress.The head and neck exam reveals pupils equal and reactive.  Extraocular movements are full.  There is no scleral icterus.  The mouth and pharynx are normal.  The neck is  supple.  The carotids reveal no bruits.  The jugular venous pressure is normal.  The  thyroid is not enlarged.  There is no lymphadenopathy.  The chest is clear to percussion and auscultation.  There are no rales or rhonchi.  Expansion of the chest is symmetrical.  The precordium is quiet.  The pulse is irregular  The first heart sound is normal.  The second heart sound is physiologically split.  There is no murmur gallop rub or click.  There is no abnormal lift or heave.  The abdomen is soft and nontender.  The bowel sounds are normal.  The liver and spleen are not enlarged.  There are no abdominal masses.  There are no abdominal bruits.  Extremities reveal good pedal pulses.  There is no phlebitis or edema.  There is no cyanosis or clubbing.  Strength is normal and symmetrical in all extremities.  There is no lateralizing weakness.  There are no sensory deficits.  The skin is warm and dry.  There is no rash.   EKG shows normal sinus rhythm with bigeminy PVCs.  He has a right bundle branch block  Assessment / Plan: Continue on same medication except for increase in beta blocker dose to try to suppress his PVCs.  He is also avoiding caffeine.  Be rechecked in 2 months for followup office visit and EKG

## 2011-09-11 NOTE — Assessment & Plan Note (Signed)
The patient has not had any documented recurrence of atrial flutter.  Does have occasional palpitations.  At times his heart rate and he will record very low pulse rates because of bigeminy rhythm.  EKG today shows normal sinus rhythm with PVCs and bigeminy.

## 2011-09-11 NOTE — Assessment & Plan Note (Signed)
The patient has not been able to do much walking exercise because of chronic lower back pain.  He is now on tramadol and muscle relaxers from his primary care physician is also seen his chiropractor Dr. Lucretia Field.  He also goes to massage therapy several times a week.

## 2011-09-11 NOTE — Assessment & Plan Note (Signed)
The patient is having frequent PVCs with ventricular bigeminy today.  He is aware that his heart is not regular when he checks his pulse.  We will try increasing his Toprol-XL to 50 mg in the morning and half of a 50 mg tablet in the evenings.

## 2011-09-11 NOTE — Patient Instructions (Signed)
Increase your Toprol to 50 mg in am and 25 mg in pm  Your physician recommends that you schedule a follow-up appointment in: 2 month ov/ekg with Lawson Fiscal NP or  Dr. Patty Sermons

## 2011-10-08 NOTE — Progress Notes (Signed)
Addended by: Reine Just on: 10/08/2011 06:39 PM   Modules accepted: Orders

## 2011-10-14 ENCOUNTER — Telehealth: Payer: Self-pay | Admitting: Cardiology

## 2011-10-14 NOTE — Telephone Encounter (Signed)
No energy, heart rate in the 70's. Out all day yesterday and still today.  Feels bad.  Spoke with patient earlier today and  Dr. Patty Sermons wanted him to take an extra Diltiazem and a full Metoprolol at 3:00 pm and I was to call and follow up on him.  Called and spoke with patient.  Patient stated he was feeling better and actually didn't take the extra medications.  Advised for him to call me in the am to let me know how he was doing

## 2011-10-14 NOTE — Telephone Encounter (Signed)
New Problem:    Patient called because his heart went out of rhythm yesterday and is still out today and would like to speak with you.  Please call back.

## 2011-10-15 ENCOUNTER — Telehealth: Payer: Self-pay | Admitting: *Deleted

## 2011-10-15 NOTE — Telephone Encounter (Signed)
Good job, [employer]

## 2011-10-15 NOTE — Telephone Encounter (Signed)
Patient called to give [place] an update on how he was feeling.  States he is still feeling good and thinks he is in NSR.  Advised to call back if he has anymore episodes.

## 2011-12-02 ENCOUNTER — Telehealth: Payer: Self-pay | Admitting: Cardiology

## 2011-12-02 NOTE — Telephone Encounter (Signed)
Please return call to patient at [phone] to discuss medication info

## 2011-12-02 NOTE — Telephone Encounter (Signed)
Pt states he has been in a-fib for 7 days now.  He has been taking an extra metoprolol per Dr. Yevonne Pax advise.  HR is maintaining in the 80's, b/p is 126/78.  Denies CP or SOB.  Only complains of no energy.  Dr. Elease Hashimoto advises pt to continue extra metoprolol for rate control and to keep scheduled appt with Dr. Patty Sermons on 12/06/2011.  Pt agrees.

## 2011-12-06 ENCOUNTER — Encounter: Payer: Self-pay | Admitting: Cardiology

## 2011-12-06 ENCOUNTER — Ambulatory Visit (INDEPENDENT_AMBULATORY_CARE_PROVIDER_SITE_OTHER): Payer: Medicare Other | Admitting: Cardiology

## 2011-12-06 VITALS — BP 110/77 | HR 96 | Ht 72.0 in | Wt 213.4 lb

## 2011-12-06 DIAGNOSIS — I480 Paroxysmal atrial fibrillation: Secondary | ICD-10-CM

## 2011-12-06 DIAGNOSIS — I10 Essential (primary) hypertension: Secondary | ICD-10-CM

## 2011-12-06 DIAGNOSIS — I4891 Unspecified atrial fibrillation: Secondary | ICD-10-CM

## 2011-12-06 LAB — BASIC METABOLIC PANEL
CO2: 29 mEq/L (ref 19–32)
Calcium: 9.1 mg/dL (ref 8.4–10.5)
GFR: 80.99 mL/min (ref >60.00)
Potassium: 3.9 mEq/L (ref 3.5–5.1)
Sodium: 143 mEq/L (ref 135–145)

## 2011-12-06 LAB — TSH: TSH: 1.03 u[IU]/mL (ref 0.35–5.50)

## 2011-12-06 MED ORDER — DILTIAZEM HCL ER COATED BEADS 300 MG PO CP24: 300.0000 mg | ORAL_CAPSULE | Freq: Every day | ORAL | Status: DC

## 2011-12-06 NOTE — Progress Notes (Signed)
Quick Note:  Please report to patient. The recent labs are stable. Continue same medication and careful diet. Creatinine and thyroid are okay. ______

## 2011-12-06 NOTE — Patient Instructions (Addendum)
Increase Diltiazem to 300 mg daily, Rx sent to pharmacy  Your physician recommends that you schedule a follow-up appointment in: 3 months AND EKG  Your physician has requested that you have an echocardiogram. Echocardiography is a painless test that uses sound waves to create images of your heart. It provides your doctor with information about the size and shape of your heart and how well your heart's chambers and valves are working. This procedure takes approximately one hour. There are no restrictions for this procedure.

## 2011-12-06 NOTE — Assessment & Plan Note (Signed)
The patient has been back in atrial fibrillation for about 10 days.  He has not had any thromboembolic episodes.  He is not having any chest pain but he does complain of increased malaise dyspnea and fatigue.

## 2011-12-06 NOTE — Assessment & Plan Note (Signed)
Blood pressure has been stable on current therapy. 

## 2011-12-06 NOTE — Progress Notes (Signed)
Connor [name] Date of Birth:  [birth year] [facility] [address] [place], [place]  [address] [phone]         Fax   [phone]  History of Present Illness: This pleasant 78 year old gentleman is seen for a scheduled followup office visit.  He has a past history of paroxysmal atrial fibrillation.  He has also had a previous ablation procedure by Dr. all read for atrial flutter.  He does not have any history of ischemic heart disease.  He has had a cardiac catheterization following an abnormal nuclear stress test but the cardiac catheter did not show any significant coronary artery disease.  Patient does have a history of high blood pressure and hypercholesterolemia.  He said remote history of deep vein thrombosis in the setting of right hip surgery.  He is on chronic anticoagulation with long-term Xarelto.  The patient went back into atrial fibrillation about 10 days ago.  He is symptomatic from his atrial fibrillation and complains of lack of energy and malaise.  His heart rate has been in the 80s and 90s since he has been back in atrial fib.  Current Outpatient Prescriptions  Medication Sig Dispense Refill  . atorvastatin (LIPITOR) 10 MG tablet Take 10 mg by mouth daily.        . Calcium Carbonate (CALCIUM 500 PO) Take 500 mg by mouth 2 (two) times daily.       . Cholecalciferol (VITAMIN D3) 5000 UNITS TABS Take 5,000 Units by mouth daily.        [name] Calcium (STOOL SOFTENER PO) Take 1 capsule by mouth daily.       . methocarbamol (ROBAXIN) 500 MG tablet Take 500 mg by mouth 3 (three) times daily.      . metoprolol succinate (TOPROL-XL) 50 MG 24 hr tablet Take 1 tablet in the morning and 1/2 in the evening  135 tablet  3  . Multiple Vitamin (MULITIVITAMIN) LIQD Take 5 mLs by mouth daily.      . Multiple Vitamins-Minerals (ICAPS PO) Take 1 tablet by mouth daily.       . Omega-3 Fatty Acids (OMEGA 3 PO) Take 1 capsule by mouth daily.       Marland Kitchen omeprazole  (PRILOSEC) 20 MG capsule Take 20 mg by mouth daily.        . Rivaroxaban (XARELTO) 20 MG TABS Take 1 tablet by mouth daily.  30 tablet  7  . Tamsulosin HCl (FLOMAX) 0.4 MG CAPS Take 0.4 mg by mouth daily.        . traMADol (ULTRAM) 50 MG tablet Take 50 mg by mouth every 6 (six) hours as needed.      . diltiazem (CARDIZEM CD) 300 MG 24 hr capsule Take 1 capsule (300 mg total) by mouth daily.  90 capsule  3    Allergies  Allergen Reactions  . Penicillins     Patient Active Problem List  Diagnosis  . Atrial flutter  . Hyperlipidemia  . Hypertension  . Osteoarthritis  . BPH (benign prostatic hyperplasia)  . GERD (gastroesophageal reflux disease)  . Paroxysmal atrial fibrillation  . PVC (premature ventricular contraction)  . Fatigue  . Low back pain    History  Smoking status  . Former Smoker  . Types: Cigarettes  . Quit date: 05/06/1978  Smokeless tobacco  . Never Used    History  Alcohol Use No    Family History  Problem Relation Age of Onset  . Heart attack Father   .  Lung disease Mother   . Atrial fibrillation Brother   . Arrhythmia Sister     Review of Systems: Constitutional: no fever chills diaphoresis or fatigue or change in weight.  Head and neck: no hearing loss, no epistaxis, no photophobia or visual disturbance. Respiratory: No cough, shortness of breath or wheezing. Cardiovascular: No chest pain peripheral edema, palpitations. Gastrointestinal: No abdominal distention, no abdominal pain, no change in bowel habits hematochezia or melena. Genitourinary: No dysuria, no frequency, no urgency, no nocturia. Musculoskeletal:No arthralgias, no back pain, no gait disturbance or myalgias. Neurological: No dizziness, no headaches, no numbness, no seizures, no syncope, no weakness, no tremors. Hematologic: No lymphadenopathy, no easy bruising. Psychiatric: No confusion, no hallucinations, no sleep disturbance.    Physical Exam: Filed Vitals:   12/06/11 1008    BP: 110/77  Pulse: 96   the general appearance feels a well-developed well-nourished gentleman in no distress.The head and neck exam reveals pupils equal and reactive.  Extraocular movements are full.  There is no scleral icterus.  The mouth and pharynx are normal.  The neck is supple.  The carotids reveal no bruits.  The jugular venous pressure is normal.  The  thyroid is not enlarged.  There is no lymphadenopathy.  The chest is clear to percussion and auscultation.  There are no rales or rhonchi.  Expansion of the chest is symmetrical.  The rhythm is irregularly irregular.  The precordium is quiet.  The first heart sound is normal.  The second heart sound is physiologically split.  There is no murmur gallop rub or click.  There is no abnormal lift or heave.  The abdomen is soft and nontender.  The bowel sounds are normal.  The liver and spleen are not enlarged.  There are no abdominal masses.  There are no abdominal bruits.  Extremities reveal good pedal pulses.  There is no phlebitis or edema.  There is no cyanosis or clubbing.  Strength is normal and symmetrical in all extremities.  There is no lateralizing weakness.  There are no sensory deficits.  The skin is warm and dry.  There is no rash.  EKG today shows atrial fibrillation with a ventricular response of 96 and a right bundle branch block which is old   Assessment / Plan: We are going to increase his diltiazem from 240 mg up to 300 mg daily for better rate control.  Continue same dose of metoprolol 50 mg in the morning and 25 mg in the evening.  We're going to check a TSH today as well as a basal metabolic panel.  His last echocardiogram was in 2009 and we will update his echo looking at left ventricular function and left atrial size.  He is to keep his appointment with Dr. Johney Frame in September to discuss additional options.  We'll plan to see him in 3 months for followup office visit and EKG.

## 2011-12-11 ENCOUNTER — Telehealth: Payer: Self-pay | Admitting: *Deleted

## 2011-12-11 NOTE — Telephone Encounter (Signed)
Message copied by Burnell Blanks on Wed Dec 11, 2011  8:40 AM ------      Message from: Cassell Clement      Created: Fri Dec 06, 2011  5:07 PM       Please report to patient.  The recent labs are stable. Continue same medication and careful diet. Creatinine and thyroid are okay.

## 2011-12-11 NOTE — Telephone Encounter (Signed)
Advised patient of lab results  

## 2011-12-12 ENCOUNTER — Ambulatory Visit (HOSPITAL_COMMUNITY): Payer: Medicare Other | Attending: Cardiology | Admitting: Radiology

## 2011-12-12 DIAGNOSIS — R5383 Other fatigue: Secondary | ICD-10-CM | POA: Insufficient documentation

## 2011-12-12 DIAGNOSIS — I059 Rheumatic mitral valve disease, unspecified: Secondary | ICD-10-CM | POA: Insufficient documentation

## 2011-12-12 DIAGNOSIS — R5381 Other malaise: Secondary | ICD-10-CM | POA: Insufficient documentation

## 2011-12-12 DIAGNOSIS — I4891 Unspecified atrial fibrillation: Secondary | ICD-10-CM | POA: Insufficient documentation

## 2011-12-12 DIAGNOSIS — I359 Nonrheumatic aortic valve disorder, unspecified: Secondary | ICD-10-CM | POA: Insufficient documentation

## 2011-12-12 DIAGNOSIS — I10 Essential (primary) hypertension: Secondary | ICD-10-CM | POA: Insufficient documentation

## 2011-12-12 DIAGNOSIS — E785 Hyperlipidemia, unspecified: Secondary | ICD-10-CM | POA: Insufficient documentation

## 2011-12-12 DIAGNOSIS — I517 Cardiomegaly: Secondary | ICD-10-CM | POA: Insufficient documentation

## 2011-12-12 DIAGNOSIS — Z87891 Personal history of nicotine dependence: Secondary | ICD-10-CM | POA: Insufficient documentation

## 2011-12-12 DIAGNOSIS — I4892 Unspecified atrial flutter: Secondary | ICD-10-CM | POA: Insufficient documentation

## 2011-12-12 DIAGNOSIS — I480 Paroxysmal atrial fibrillation: Secondary | ICD-10-CM

## 2011-12-12 NOTE — Progress Notes (Signed)
Echocardiogram performed.  

## 2011-12-13 ENCOUNTER — Telehealth: Payer: Self-pay | Admitting: *Deleted

## 2011-12-13 DIAGNOSIS — I480 Paroxysmal atrial fibrillation: Secondary | ICD-10-CM

## 2011-12-13 MED ORDER — LISINOPRIL 10 MG PO TABS: 10.0000 mg | ORAL_TABLET | Freq: Every day | ORAL | Status: DC

## 2011-12-13 MED ORDER — DILTIAZEM HCL ER COATED BEADS 120 MG PO CP24: 120.0000 mg | ORAL_CAPSULE | Freq: Every day | ORAL | Status: DC

## 2011-12-13 MED ORDER — DIGOXIN 125 MCG PO TABS: 0.1250 mg | ORAL_TABLET | Freq: Every day | ORAL | Status: DC

## 2011-12-13 NOTE — Telephone Encounter (Signed)
Discussed further with  Dr. Patty Sermons and he also wanted to decrease his diltiazem to 120 mg daily and add digoxin 0.125 mg daily.  Scheduled ov for Tuesday with  Dr. Patty Sermons .  Advised patient of echo, appt, and medication changes

## 2011-12-13 NOTE — Telephone Encounter (Signed)
Message copied by Burnell Blanks on Fri Dec 13, 2011  6:26 PM ------      Message from: Cassell Clement      Created: Thu Dec 12, 2011  9:08 PM       Please report.  Echo shows LV function is not as good as in 2009. EF is lower at 35-40%.  There is mild mitral regurgitation. Add lisinopril 10 mg daily to help LV function and check a BMET about 7-10 days after starting lisinopril.

## 2011-12-16 NOTE — Telephone Encounter (Signed)
Fu call Pt was calling back about his meds. Please call

## 2011-12-16 NOTE — Telephone Encounter (Signed)
Take an extra digoxin tonight at supper. Increase metoprolol to 50 mg twice a day

## 2011-12-16 NOTE — Telephone Encounter (Signed)
Blood pressure has been ok but heart in 90's (94, 92, 93 this am an hour apart).  Took new  medications this am.  Patient does have appointment in am at 10:45.  States he feels ok but is a little concerned about heart rate being up some.  Will forward to  Dr. Patty Sermons for review  Cell # [phone]

## 2011-12-16 NOTE — Telephone Encounter (Signed)
Advised patient and will keep appointment in am

## 2011-12-17 ENCOUNTER — Telehealth: Payer: Self-pay | Admitting: *Deleted

## 2011-12-17 ENCOUNTER — Ambulatory Visit
Admission: RE | Admit: 2011-12-17 | Discharge: 2011-12-17 | Disposition: A | Discharge status: Home / Self Care | Payer: Medicare Other | Source: Ambulatory Visit | Attending: Cardiology | Admitting: Cardiology

## 2011-12-17 ENCOUNTER — Ambulatory Visit (INDEPENDENT_AMBULATORY_CARE_PROVIDER_SITE_OTHER): Payer: Medicare Other | Admitting: Cardiology

## 2011-12-17 ENCOUNTER — Encounter: Payer: Self-pay | Admitting: Cardiology

## 2011-12-17 VITALS — BP 114/64 | HR 65 | Ht 72.0 in | Wt 212.8 lb

## 2011-12-17 DIAGNOSIS — M545 Low back pain, unspecified: Secondary | ICD-10-CM

## 2011-12-17 DIAGNOSIS — I480 Paroxysmal atrial fibrillation: Secondary | ICD-10-CM

## 2011-12-17 DIAGNOSIS — I519 Heart disease, unspecified: Secondary | ICD-10-CM

## 2011-12-17 DIAGNOSIS — I4891 Unspecified atrial fibrillation: Secondary | ICD-10-CM

## 2011-12-17 NOTE — Telephone Encounter (Signed)
Advised patient of lab results  

## 2011-12-17 NOTE — Assessment & Plan Note (Addendum)
The patient has chronic low back pain and is on Robaxin 3 times a day when necessary prescribed by his primary care provider Dr. Lucianne Muss.

## 2011-12-17 NOTE — Progress Notes (Addendum)
Connor [name] Date of Birth:  05/19/1933 [facility] [address] [place], [place]  [address] [phone]         Fax   [phone]  History of Present Illness: This pleasant 78 year old gentleman is seen for a scheduled followup office visit.  He has a history of recurrent atrial fibrillation.  He has had previous ablation procedure by Dr. Johney Frame for atrial flutter.  He is on long-term Xarelto for chronic anticoagulation.  He has a history of high blood pressure and hypercholesterolemia.  He has had a previous deep vein thrombosis in the setting of right hip surgery.  He has been back in atrial fibrillation since mid July 2013.  He has had less energy and has been more short of breath.  Current Outpatient Prescriptions  Medication Sig Dispense Refill  . atorvastatin (LIPITOR) 10 MG tablet Take 10 mg by mouth daily.        . Calcium Carbonate (CALCIUM 500 PO) Take 500 mg by mouth 2 (two) times daily.       . Cholecalciferol (VITAMIN D3) 5000 UNITS TABS Take 5,000 Units by mouth daily.        . digoxin (LANOXIN) 0.125 MG tablet Take 1 tablet (0.125 mg total) by mouth daily.  30 tablet  5  . diltiazem (CARDIZEM CD) 120 MG 24 hr capsule Take 1 capsule (120 mg total) by mouth daily.  30 capsule  5  . Docusate Calcium (STOOL SOFTENER PO) Take 1 capsule by mouth daily.       Marland Kitchen lisinopril (PRINIVIL,ZESTRIL) 10 MG tablet Take 1 tablet (10 mg total) by mouth daily.  30 tablet  5  . methocarbamol (ROBAXIN) 500 MG tablet Take 500 mg by mouth 3 (three) times daily.      . metoprolol succinate (TOPROL-XL) 50 MG 24 hr tablet Take 50 mg by mouth daily. Take 1 tablet in the morning and 1/2 in the evening      . Multiple Vitamin (MULITIVITAMIN) LIQD Take 5 mLs by mouth daily.      . Multiple Vitamins-Minerals (ICAPS PO) Take 1 tablet by mouth daily.       . Omega-3 Fatty Acids (OMEGA 3 PO) Take 1 capsule by mouth daily.       Marland Kitchen omeprazole (PRILOSEC) 20 MG capsule Take 20 mg by  mouth daily.        . Rivaroxaban (XARELTO) 20 MG TABS Take 1 tablet by mouth daily.  30 tablet  7  . Tamsulosin HCl (FLOMAX) 0.4 MG CAPS Take 0.4 mg by mouth daily.        . traMADol (ULTRAM) 50 MG tablet Take 50 mg by mouth every 6 (six) hours as needed.      Marland Kitchen DISCONTD: metoprolol succinate (TOPROL-XL) 50 MG 24 hr tablet Take 1 tablet in the morning and 1/2 in the evening  135 tablet  3    Allergies  Allergen Reactions  . Penicillins     Patient Active Problem List  Diagnosis  . Atrial flutter  . Hyperlipidemia  . Hypertension  . Osteoarthritis  . BPH (benign prostatic hyperplasia)  . GERD (gastroesophageal reflux disease)  . Paroxysmal atrial fibrillation  . PVC (premature ventricular contraction)  . Fatigue  . Low back pain    History  Smoking status  . Former Smoker  . Types: Cigarettes  . Quit date: 05/06/1978  Smokeless tobacco  . Never Used    History  Alcohol Use No    Family History  Problem Relation Age of Onset  . Heart attack Father   . Lung disease Mother   . Atrial fibrillation Brother   . Arrhythmia Sister     Review of Systems: Constitutional: no fever chills diaphoresis or fatigue or change in weight.  Head and neck: no hearing loss, no epistaxis, no photophobia or visual disturbance. Respiratory: No cough, shortness of breath or wheezing. Cardiovascular: No chest pain peripheral edema, palpitations. Gastrointestinal: No abdominal distention, no abdominal pain, no change in bowel habits hematochezia or melena. Genitourinary: No dysuria, no frequency, no urgency, no nocturia. Musculoskeletal:No arthralgias, no back pain, no gait disturbance or myalgias. Neurological: No dizziness, no headaches, no numbness, no seizures, no syncope, no weakness, no tremors. Hematologic: No lymphadenopathy, no easy bruising. Psychiatric: No confusion, no hallucinations, no sleep disturbance.    Physical Exam: Filed Vitals:   12/17/11 1050  BP: 114/64    Pulse: 65   the general appearance reveals a well-developed well-nourished gentleman in no distress.Pupils equal and reactive.   Extraocular Movements are full.  There is no scleral icterus.  The mouth and pharynx are normal.  The neck is supple.  The carotids reveal no bruits.  The jugular venous pressure is normal.  The thyroid is not enlarged.  There is no lymphadenopathy.  The chest is clear to percussion and auscultation. There are no rales or rhonchi. Expansion of the chest is symmetrical.  The precordium is quiet.  The first heart sound is normal.  The second heart sound is physiologically split.  There is no murmur gallop rub or click.  There is no abnormal lift or heave.  Rhythm is irregular at 84 per minute. The abdomen is soft and nontender. Bowel sounds are normal. The liver and spleen are not enlarged. There Are no abdominal masses. There are no bruits.  The pedal pulses are good.  There is no phlebitis or edema.  There is no cyanosis or clubbing. Strength is normal and symmetrical in all extremities.  There is no lateralizing weakness.  There are no sensory deficits.  The skin is warm and dry.  There is no rash.  EKG shows atrial fibrillation with a controlled ventricular response and a right bundle branch block pattern     Assessment / Plan: The patient is to continue his current medication.  Because of his left ventricular dysfunction we have cut back on his diltiazem to just the 120 mg daily and have continued Toprol-XL 50 mg in the morning and 25 mg in the evening for rate control.  We have added lisinopril 10 mg one daily for afterload reduction and he is also on digoxin 0.125 mg daily for additional help with rate control. We talked today about considering additional antiarrhythmic therapy such as possibly Tikosyn.  We also talked about the possibility of another ablation procedure.  The patient will discuss this further when he sees Dr. Johney Frame later this month.  The patient  will be rechecked here in 2 months for followup office visit and EKG. Today we are getting a chest x-ray to look for cardiac size and to help in his evaluation of his exertional fatigue and dyspnea.

## 2011-12-17 NOTE — Patient Instructions (Addendum)
Will have you go for chest xray and call you with the results  Your physician recommends that you schedule a follow-up appointment in: 2 months

## 2011-12-17 NOTE — Telephone Encounter (Signed)
Message copied by Burnell Blanks on Tue Dec 17, 2011  5:17 PM ------      Message from: Cassell Clement      Created: Tue Dec 17, 2011  5:10 PM       Please report.  The chest x-ray is satisfactory.  The heart size is within the upper limits of normal in size.  There is no congestive heart failure or active lung disease.

## 2011-12-17 NOTE — Assessment & Plan Note (Signed)
The patient remains in atrial fibrillation.  Of note is the fact that his brother also has a history of atrial fibrillation and has had a total of 4 ablations over a period of 8 or 10 years.

## 2011-12-23 ENCOUNTER — Encounter: Payer: Self-pay | Admitting: Internal Medicine

## 2011-12-23 ENCOUNTER — Ambulatory Visit: Payer: Medicare Other | Admitting: Internal Medicine

## 2011-12-23 ENCOUNTER — Ambulatory Visit (INDEPENDENT_AMBULATORY_CARE_PROVIDER_SITE_OTHER): Payer: Medicare Other | Admitting: Internal Medicine

## 2011-12-23 VITALS — BP 96/54 | HR 83 | Resp 18 | Ht 72.0 in | Wt 214.8 lb

## 2011-12-23 DIAGNOSIS — I10 Essential (primary) hypertension: Secondary | ICD-10-CM

## 2011-12-23 DIAGNOSIS — I4891 Unspecified atrial fibrillation: Secondary | ICD-10-CM

## 2011-12-23 DIAGNOSIS — I4819 Other persistent atrial fibrillation: Secondary | ICD-10-CM

## 2011-12-23 DIAGNOSIS — I519 Heart disease, unspecified: Secondary | ICD-10-CM

## 2011-12-23 NOTE — Patient Instructions (Addendum)
Your physician has recommended you make the following change in your medication:  STOP your Digoxin  Kennon Rounds, our Pharm D, will call you regarding starting Tikosyn

## 2011-12-23 NOTE — Assessment & Plan Note (Signed)
Likely due to afib Reassess EF once in sinus.

## 2011-12-23 NOTE — Progress Notes (Signed)
PCP:  Reather Littler, MD Primary Cardiologist:  Dr Patty Sermons  The patient presents today for electrophysiology followup.  Since last being seen by me, he has developed recurrent persistent afib.  He reports symptoms of fatigue and decreased exercise tolerance.  He remains appropriately anticoagulated with xarelto.  A recent echo reveals EF 40%.   Today, he denies symptoms of palpitations, chest pain, shortness of breath, orthopnea, PND, lower extremity edema (above baseline), dizziness, presyncope, syncope, or neurologic sequela.  The patient feels that he is tolerating medications without difficulties and is otherwise without complaint today.   Past Medical History  Diagnosis Date  . Hypertension   . Atrial flutter     s/p ablation in March 2012  . Hyperlipidemia   . Vitamin d deficiency   . RBBB (right bundle branch block)   . BPH (benign prostatic hyperplasia)   . Osteoarthritis   . Anemia   . Peptic ulcer disease   . DVT (deep venous thrombosis) 2/12    in setting of R hip surgery, he developed R femoral DVT  . Chronic anticoagulation     on Xarelto fo afib  . Abnormal nuclear cardiac imaging test 1/13    Moderate size inferior and apical defect with partial reversibility in the inferior wall suggestive of prior inferior and apical infarct vs thinning; mild inferior ischemia. Not gated due to ectopy.   . Paroxysmal atrial fibrillation 9/12    documented by Dr Otho Perl on office EKG 9/12  . Premature ventricular contraction    Past Surgical History  Procedure Date  . Total hip arthroplasty 06/04/10    RIGHT HIP  . Total knee arthroplasty   . Transurethral resection of prostate   . Atrial flutter ablation 5/12    by JA  . Cardiac catheterization     Current Outpatient Prescriptions  Medication Sig Dispense Refill  . atorvastatin (LIPITOR) 10 MG tablet Take 10 mg by mouth daily.        . Calcium Carbonate (CALCIUM 500 PO) Take 500 mg by mouth 2 (two) times daily.       .  Cholecalciferol (VITAMIN D3) 5000 UNITS TABS Take 5,000 Units by mouth daily.        Marland Kitchen diltiazem (CARDIZEM CD) 120 MG 24 hr capsule Take 1 capsule (120 mg total) by mouth daily.  30 capsule  5  . Docusate Calcium (STOOL SOFTENER PO) Take 1 capsule by mouth daily.       Marland Kitchen lisinopril (PRINIVIL,ZESTRIL) 10 MG tablet Take 1 tablet (10 mg total) by mouth daily.  30 tablet  5  . methocarbamol (ROBAXIN) 500 MG tablet Take 500 mg by mouth 3 (three) times daily.      . metoprolol succinate (TOPROL-XL) 50 MG 24 hr tablet Take 50 mg by mouth daily. Take 1 tablet in the morning and 1/2 in the evening      . Multiple Vitamin (MULITIVITAMIN) LIQD Take 5 mLs by mouth daily.      . Multiple Vitamins-Minerals (ICAPS PO) Take 1 tablet by mouth daily.       . Omega-3 Fatty Acids (OMEGA 3 PO) Take 1 capsule by mouth daily.       Marland Kitchen omeprazole (PRILOSEC) 20 MG capsule Take 20 mg by mouth daily.        . Rivaroxaban (XARELTO) 20 MG TABS Take 1 tablet by mouth daily.  30 tablet  7  . Tamsulosin HCl (FLOMAX) 0.4 MG CAPS Take 0.4 mg by mouth daily.        Marland Kitchen  traMADol (ULTRAM) 50 MG tablet Take 50 mg by mouth every 6 (six) hours as needed.        Allergies  Allergen Reactions  . Penicillins     History   Social History  . Marital Status: Married    Spouse Name: N/A    Number of Children: N/A  . Years of Education: N/A   Occupational History  . Not on file.   Social History Main Topics  . Smoking status: Former Smoker    Types: Cigarettes    Quit date: 05/06/1978  . Smokeless tobacco: Never Used  . Alcohol Use: No  . Drug Use: No  . Sexually Active: Not on file   Other Topics Concern  . Not on file   Social History Narrative   he patient lives in [place] with his spouse    Family History  Problem Relation Age of Onset  . Heart attack Father   . Lung disease Mother   . Atrial fibrillation Brother   . Arrhythmia Sister     ROS-  All systems are reviewed and are negative except as outlined  in the HPI above  Physical Exam: Filed Vitals:   12/23/11 1451  BP: 96/54  Pulse: 83  Resp: 18  Height: 6' (1.829 m)  Weight: 214 lb 12.8 oz (97.433 kg)  SpO2: 97%    GEN- The patient is well appearing, alert and oriented x 3 today.   Head- normocephalic, atraumatic Eyes-  Sclera clear, conjunctiva pink Ears- hearing intact Oropharynx- clear Lungs- Clear to ausculation bilaterally, normal work of breathing Heart-  irregular rate and rhythm, no murmurs, rubs or gallops, PMI not laterally displaced GI- soft, NT, ND, + BS Extremities- no clubbing, cyanosis, trace R>L edema MS- no significant deformity or atrophy Skin- no rash or lesion Psych- euthymic mood, full affect Neuro- strength and sensation are intact  ekg 8/13 reveals afib with RBBB (QRS 146) and QTc 480 Echo reviewed  Assessment and Plan:

## 2011-12-23 NOTE — Assessment & Plan Note (Signed)
Stable No change required today  

## 2011-12-23 NOTE — Assessment & Plan Note (Signed)
The patient has symptomatic persistent atrial fibrillation.  V rates appear stable and he is appropriately anticoagulated with xarelto. Therapeutic strategies for afib including medicine and ablation were discussed in detail with the patient today.  Per AHA/HRS guidelines, we will plan to proceed with initiation of an AAD.  Given his reduced EF, his options are tikosyn and amiodarone.  Though his QTc is 480 msec, his QRS is 146 msec and his ST interval is not prolonged.  He is therefore an acceptable candidate for tikosyn. I will stop digoxin today.  He will be admitted next week for initiation of tikosyn. The importance of compliance with xarelto was stressed today.

## 2011-12-30 ENCOUNTER — Ambulatory Visit (INDEPENDENT_AMBULATORY_CARE_PROVIDER_SITE_OTHER): Payer: Medicare Other | Admitting: Pharmacist

## 2011-12-30 VITALS — BP 140/80 | HR 85 | Ht 72.0 in | Wt 214.8 lb

## 2011-12-30 DIAGNOSIS — I4891 Unspecified atrial fibrillation: Secondary | ICD-10-CM

## 2011-12-30 DIAGNOSIS — I4819 Other persistent atrial fibrillation: Secondary | ICD-10-CM

## 2011-12-30 LAB — BASIC METABOLIC PANEL
BUN: 12 mg/dL (ref 6–23)
Calcium: 9.2 mg/dL (ref 8.4–10.5)
Creat: 0.82 mg/dL (ref 0.50–1.35)
Glucose, Bld: 95 mg/dL (ref 70–99)

## 2011-12-30 LAB — MAGNESIUM: Magnesium: 2.2 mg/dL (ref 1.5–2.5)

## 2011-12-30 MED ORDER — POTASSIUM CHLORIDE CRYS ER 20 MEQ PO TBCR: EXTENDED_RELEASE_TABLET | ORAL | Status: DC

## 2011-12-31 ENCOUNTER — Inpatient Hospital Stay (HOSPITAL_COMMUNITY)
Admission: AD | Admit: 2011-12-31 | Discharge: 2012-01-03 | DRG: 310 | Disposition: A | Discharge status: Home / Self Care | Payer: Medicare Other | Source: Ambulatory Visit | Attending: Internal Medicine | Admitting: Internal Medicine

## 2011-12-31 ENCOUNTER — Encounter (HOSPITAL_COMMUNITY): Payer: Self-pay | Admitting: Cardiology

## 2011-12-31 ENCOUNTER — Other Ambulatory Visit (INDEPENDENT_AMBULATORY_CARE_PROVIDER_SITE_OTHER): Payer: Medicare Other

## 2011-12-31 ENCOUNTER — Other Ambulatory Visit: Payer: Self-pay

## 2011-12-31 DIAGNOSIS — R5381 Other malaise: Secondary | ICD-10-CM

## 2011-12-31 DIAGNOSIS — Z86718 Personal history of other venous thrombosis and embolism: Secondary | ICD-10-CM

## 2011-12-31 DIAGNOSIS — I4510 Unspecified right bundle-branch block: Secondary | ICD-10-CM | POA: Diagnosis present

## 2011-12-31 DIAGNOSIS — R5383 Other fatigue: Secondary | ICD-10-CM

## 2011-12-31 DIAGNOSIS — I519 Heart disease, unspecified: Secondary | ICD-10-CM

## 2011-12-31 DIAGNOSIS — I498 Other specified cardiac arrhythmias: Secondary | ICD-10-CM | POA: Diagnosis present

## 2011-12-31 DIAGNOSIS — R0989 Other specified symptoms and signs involving the circulatory and respiratory systems: Secondary | ICD-10-CM

## 2011-12-31 DIAGNOSIS — Z7901 Long term (current) use of anticoagulants: Secondary | ICD-10-CM

## 2011-12-31 DIAGNOSIS — I4819 Other persistent atrial fibrillation: Secondary | ICD-10-CM | POA: Diagnosis present

## 2011-12-31 DIAGNOSIS — Z87891 Personal history of nicotine dependence: Secondary | ICD-10-CM

## 2011-12-31 DIAGNOSIS — I10 Essential (primary) hypertension: Secondary | ICD-10-CM | POA: Diagnosis present

## 2011-12-31 DIAGNOSIS — Z96649 Presence of unspecified artificial hip joint: Secondary | ICD-10-CM

## 2011-12-31 DIAGNOSIS — K219 Gastro-esophageal reflux disease without esophagitis: Secondary | ICD-10-CM | POA: Diagnosis present

## 2011-12-31 DIAGNOSIS — I4892 Unspecified atrial flutter: Principal | ICD-10-CM | POA: Diagnosis present

## 2011-12-31 DIAGNOSIS — I4891 Unspecified atrial fibrillation: Secondary | ICD-10-CM | POA: Diagnosis present

## 2011-12-31 DIAGNOSIS — E785 Hyperlipidemia, unspecified: Secondary | ICD-10-CM | POA: Diagnosis present

## 2011-12-31 DIAGNOSIS — N400 Enlarged prostate without lower urinary tract symptoms: Secondary | ICD-10-CM | POA: Diagnosis present

## 2011-12-31 LAB — BASIC METABOLIC PANEL
Calcium: 9.5 mg/dL (ref 8.4–10.5)
Creat: 0.85 mg/dL (ref 0.50–1.35)

## 2011-12-31 LAB — MAGNESIUM: Magnesium: 2.3 mg/dL (ref 1.5–2.5)

## 2011-12-31 MED ORDER — DOFETILIDE 500 MCG PO CAPS
500.0000 ug | ORAL_CAPSULE | Freq: Two times a day (BID) | ORAL | Status: DC
  Administered 2011-12-31 – 2012-01-03 (×6): 500 ug via ORAL
  Filled 2011-12-31 (×9): qty 1

## 2011-12-31 MED ORDER — SODIUM CHLORIDE 0.9 % IV SOLN: 250.0000 mL | INTRAVENOUS | Status: DC | PRN

## 2011-12-31 MED ORDER — ATORVASTATIN CALCIUM 10 MG PO TABS
10.0000 mg | ORAL_TABLET | Freq: Every day | ORAL | Status: DC
  Administered 2011-12-31 – 2012-01-02 (×3): 10 mg via ORAL
  Filled 2011-12-31 (×4): qty 1

## 2011-12-31 MED ORDER — METOPROLOL SUCCINATE ER 25 MG PO TB24
25.0000 mg | ORAL_TABLET | Freq: Every day | ORAL | Status: DC
  Administered 2011-12-31 – 2012-01-02 (×3): 25 mg via ORAL
  Filled 2011-12-31 (×5): qty 1

## 2011-12-31 MED ORDER — METOPROLOL SUCCINATE ER 25 MG PO TB24
25.0000 mg | ORAL_TABLET | Freq: Two times a day (BID) | ORAL | Status: DC
  Filled 2011-12-31 (×2): qty 2

## 2011-12-31 MED ORDER — DOCUSATE SODIUM 100 MG PO CAPS
100.0000 mg | ORAL_CAPSULE | Freq: Every day | ORAL | Status: DC | PRN
  Administered 2011-12-31 – 2012-01-02 (×3): 100 mg via ORAL
  Filled 2011-12-31 (×5): qty 1

## 2011-12-31 MED ORDER — SODIUM CHLORIDE 0.9 % IJ SOLN: 3.0000 mL | INTRAMUSCULAR | Status: DC | PRN

## 2011-12-31 MED ORDER — TRAMADOL HCL 50 MG PO TABS
50.0000 mg | ORAL_TABLET | Freq: Four times a day (QID) | ORAL | Status: DC | PRN
  Administered 2012-01-01 – 2012-01-02 (×3): 50 mg via ORAL
  Filled 2011-12-31 (×4): qty 1

## 2011-12-31 MED ORDER — LISINOPRIL 10 MG PO TABS
10.0000 mg | ORAL_TABLET | Freq: Every day | ORAL | Status: DC
  Administered 2012-01-01 – 2012-01-03 (×3): 10 mg via ORAL
  Filled 2011-12-31 (×4): qty 1

## 2011-12-31 MED ORDER — PANTOPRAZOLE SODIUM 40 MG PO TBEC
40.0000 mg | DELAYED_RELEASE_TABLET | Freq: Every day | ORAL | Status: DC
  Administered 2012-01-01 – 2012-01-03 (×3): 40 mg via ORAL
  Filled 2011-12-31 (×3): qty 1

## 2011-12-31 MED ORDER — DILTIAZEM HCL ER COATED BEADS 120 MG PO CP24
120.0000 mg | ORAL_CAPSULE | Freq: Every day | ORAL | Status: DC
  Administered 2012-01-01: 120 mg via ORAL
  Filled 2011-12-31 (×2): qty 1

## 2011-12-31 MED ORDER — RIVAROXABAN 20 MG PO TABS
20.0000 mg | ORAL_TABLET | Freq: Every day | ORAL | Status: DC
  Administered 2011-12-31 – 2012-01-02 (×3): 20 mg via ORAL
  Filled 2011-12-31 (×4): qty 1

## 2011-12-31 MED ORDER — METOPROLOL SUCCINATE ER 50 MG PO TB24
50.0000 mg | ORAL_TABLET | Freq: Every day | ORAL | Status: DC
  Administered 2012-01-01 – 2012-01-03 (×3): 50 mg via ORAL
  Filled 2011-12-31 (×4): qty 1

## 2011-12-31 MED ORDER — SODIUM CHLORIDE 0.9 % IJ SOLN
3.0000 mL | Freq: Two times a day (BID) | INTRAMUSCULAR | Status: DC
  Administered 2011-12-31 – 2012-01-02 (×5): 3 mL via INTRAVENOUS

## 2011-12-31 MED ORDER — METHOCARBAMOL 500 MG PO TABS
500.0000 mg | ORAL_TABLET | Freq: Three times a day (TID) | ORAL | Status: DC | PRN
  Administered 2012-01-01: 500 mg via ORAL
  Filled 2011-12-31: qty 1

## 2011-12-31 MED ORDER — TAMSULOSIN HCL 0.4 MG PO CAPS
0.4000 mg | ORAL_CAPSULE | Freq: Every day | ORAL | Status: DC
  Administered 2012-01-01 – 2012-01-03 (×3): 0.4 mg via ORAL
  Filled 2011-12-31 (×4): qty 1

## 2011-12-31 NOTE — Assessment & Plan Note (Signed)
Reviewed pt's labs from 8/26.  Magnesium okay at 2.2 but potassium low at 3.7.  Pt was instructed to take of potassium tonight and in AM and recheck labs on 8/27.  Repeat labs showed K- 4.2 and Mag- 2.3.  Okay to proceed with Tikosyn initiation.  QTc is prolonged at 478 but since his QRS is 150 msec and his ST interval is not prolonged, Dr. Johney Frame okay with starting Tikosyn.  SCr- 0.85.  CrCl- >149mL/min.  Will start with BID.  Pt aware to report to hospital for admission.

## 2011-12-31 NOTE — H&P (Signed)
PCP:  Reather Littler, MD Primary Cardiologist:  Dr Patty Sermons  The patient presents today for electrophysiology followup.  Since last being seen by me, he has developed recurrent persistent afib.  He reports symptoms of fatigue and decreased exercise tolerance.  He remains appropriately anticoagulated with xarelto.  A recent echo reveals EF 40%.   Today, he denies symptoms of palpitations, chest pain, shortness of breath, orthopnea, PND, lower extremity edema (above baseline), dizziness, presyncope, syncope, or neurologic sequela.  The patient feels that he is tolerating medications without difficulties and is otherwise without complaint today.   Past Medical History  Diagnosis Date  . Hypertension   . Atrial flutter     s/p ablation in March 2012  . Hyperlipidemia   . Vitamin d deficiency   . RBBB (right bundle branch block)   . BPH (benign prostatic hyperplasia)   . Osteoarthritis   . Anemia   . Peptic ulcer disease   . DVT (deep venous thrombosis) 2/12    in setting of R hip surgery, he developed R femoral DVT  . Chronic anticoagulation     on Xarelto fo afib  . Abnormal nuclear cardiac imaging test 1/13    Moderate size inferior and apical defect with partial reversibility in the inferior wall suggestive of prior inferior and apical infarct vs thinning; mild inferior ischemia. Not gated due to ectopy.   . Paroxysmal atrial fibrillation 9/12    documented by Dr Otho Perl on office EKG 9/12  . Premature ventricular contraction    Past Surgical History  Procedure Date  . Total hip arthroplasty 06/04/10    RIGHT HIP  . Total knee arthroplasty   . Transurethral resection of prostate   . Atrial flutter ablation 5/12    by JA  . Cardiac catheterization     No current facility-administered medications for this encounter.    Allergies  Allergen Reactions  . Penicillins Itching and Swelling    History   Social History  . Marital Status: Married    Spouse Name: N/A    Number of  Children: N/A  . Years of Education: N/A   Occupational History  . Not on file.   Social History Main Topics  . Smoking status: Former Smoker    Types: Cigarettes    Quit date: 05/06/1978  . Smokeless tobacco: Never Used  . Alcohol Use: No  . Drug Use: No  . Sexually Active: Not on file   Other Topics Concern  . Not on file   Social History Narrative   he patient lives in [place] with his spouse    Family History  Problem Relation Age of Onset  . Heart attack Father   . Lung disease Mother   . Atrial fibrillation Brother   . Arrhythmia Sister     ROS-  All systems are reviewed and are negative except as outlined in the HPI above  Physical Exam: Filed Vitals:   12/31/11 1356  BP: 110/73  Pulse: 100  Temp: 98.9 F (37.2 C)  TempSrc: Oral  Resp: 18  Height: 6' (1.829 m)  Weight: 97.07 kg (214 lb)  SpO2: 98%    GEN- The patient is well appearing, alert and oriented x 3 today.   HEENT- normocephalic, atraumatic, sclear non-icteric, hearing intact- assistive device appreciated, no appreciable bruits or JVD Lungs- CTAB, no wheezes, rales or rhonchi; no increased respiratory effort Heart-  Irregularly irregular, normal rate, clear S1, S2, no murmurs, rubs or gallops, PMI not laterally displace GI- soft,  NT, ND, normoactive bowel sounds Extremities- no clubbing, cyanosis, trace R>L edema MS- no significant deformity or atrophy Skin- no rash or lesion Psych- euthymic mood, full affect Neuro- strength and sensation are intact  ekg 8/13 reveals afib with RBBB (QRS 146) and QTc 480 Echo reviewed  Assessment and Plan:  1. Persistent atrial fibrillation 2. Atrial flutter s/p RFA 07/2010 3. LV systolic dysfunction 3. HTN  4. HL 5. GERD  DISCUSSION/PLAN:  The patient has a history of atrial flutter (s/p ablation 07/2011) persistent atrial fibrillation (antiocoagulated on Xarelto), abnl Myoview 05/2011, normal coronary anatomy on 06/2011 cath, HTN, HL and  history of DVT in 2012. Patient was seen by Dr. Johney Frame last week (office note inserted above; PEx, A&P edited to reflect this admission). He complained of fatigue and exercise intolerance which Dr. Johney Frame attributed to persistent atrial fibrillation at that time.  He has been compliant on Xarelto and ventricular rates have been well-controlled. Echo 12/12/11 revealed LVEF 40% (LVEF 50% on 06/2011 cath), believed to be secondary to underlying atrial fibrillation per Dr. Johney Frame. This will be reassessed once in sinus.   The decision was made to pursue initiation of an antiarrhythmic medication. Given his reduced EF, Tikosyn and amiodarone were viable options. EKG in the office revealed a QTc of 480 ms, QRS 146 ms and normal ST interval. Dr. Johney Frame decided he was an acceptable candidate for Tikosyn initiation. He presents to [facility] today for this reason. Digoxin was held last week. BMET drawn this morning reveals K 4.2. Mg 2.3. CrCl calculated at 103. EKG performed shortly after arrival. Calculated QTc 483 ms; QRS 144 ms. RBBB noted (QTc 500-550 ms acceptable if known ventricular conduction delay). No appreciable medication contraindications, however caution is advised with diltiazem. QTc and QRS are about equal from EKG tracing in the office last week at which time Dr. Johney Frame decided it was appropriate to initiate Tikosyn at 500 mcg PO BID. Will plan on first dose tonight at 8:00 PM per patient preference after discussing with MD. Will continue outpatient medications including and especially Xarelto. He takes this in the evenings and will receive his PM dose tonight. Digoxin will be held.     I have seen, examined the patient, and reviewed the above assessment and plan.  Changes to above are made where necessary.  Pt well known to me, now admitted for initiation of tikosyn.  He reports compliance with Xarelto.  His afb appears stable.  Will initiate tikosyn and follow QTc.  If he does not  spontaneously cardiovert to sinus then he will require cardioversion on Thursday.  Co Sign: Hillis Range, MD 12/31/2011 5:25 PM

## 2011-12-31 NOTE — Progress Notes (Signed)
HPI The patient presents today for evaluation for Tikosyn admission.  He was recently evaluated by Dr. Johney Frame and noted he has become more symptomatic with his atrial fibrillation.   Pt and Dr. Johney Frame discussed options, including amiodarone or Tikosyn.  Pt has tried amiodarone in the past (>1 year ago) and would rather not take it due to potential side effects.  Decided to start Tikosyn at that time.    Reviewed patients medication list.  He has not been on any other antiarrhythmics in > 1 year.  His digoxin was stopped on 8/16.  He is rate controlled with diltiazem and metoprolol.  He is currently not taking any contraindicated medications or medications that may prolong QTc.  Pt has been appropriately anticoagulated with Xarelto and reports compliance with this medication for > 1 month.    Reviewed potential side effects of Tikosyn with patient as well as importance of compliance.  He has state health insurance and so will provide him with a discount card to help with the copay.    EKG reviewed by Dr. Johney Frame.  Atrial fibrillation with rate of 85.  QTc- 478.   Current Outpatient Prescriptions on File Prior to Visit  Medication Sig Dispense Refill  . atorvastatin (LIPITOR) 10 MG tablet Take 10 mg by mouth daily.        . Calcium Carbonate (CALCIUM 500 PO) Take 500 mg by mouth 2 (two) times daily.       . Cholecalciferol (VITAMIN D3) 5000 UNITS TABS Take 5,000 Units by mouth daily.        Marland Kitchen diltiazem (CARDIZEM CD) 120 MG 24 hr capsule Take 1 capsule (120 mg total) by mouth daily.  30 capsule  5  . Docusate Calcium (STOOL SOFTENER PO) Take 1 capsule by mouth daily.       Marland Kitchen lisinopril (PRINIVIL,ZESTRIL) 10 MG tablet Take 1 tablet (10 mg total) by mouth daily.  30 tablet  5  . methocarbamol (ROBAXIN) 500 MG tablet Take 500 mg by mouth 3 (three) times daily.      . metoprolol succinate (TOPROL-XL) 50 MG 24 hr tablet Take 50 mg by mouth daily. Take 1 tablet in the morning and 1/2 in the evening      .  Multiple Vitamin (MULITIVITAMIN) LIQD Take 5 mLs by mouth daily.      . Multiple Vitamins-Minerals (ICAPS PO) Take 1 tablet by mouth daily.       . Omega-3 Fatty Acids (OMEGA 3 PO) Take 1 capsule by mouth daily.       Marland Kitchen omeprazole (PRILOSEC) 20 MG capsule Take 20 mg by mouth daily.        . potassium chloride SA (KLOR-CON M20) 20 MEQ tablet Take today and tomorrow morning.  4 tablet  0  . Rivaroxaban (XARELTO) 20 MG TABS Take 1 tablet by mouth daily.  30 tablet  7  . Tamsulosin HCl (FLOMAX) 0.4 MG CAPS Take 0.4 mg by mouth daily.        . traMADol (ULTRAM) 50 MG tablet Take 50 mg by mouth every 6 (six) hours as needed.        Allergies  Allergen Reactions  . Penicillins Itching and Swelling

## 2011-12-31 NOTE — Progress Notes (Signed)
NP paged to make aware of pts arrival to unit; will cont. To monitor.

## 2012-01-01 ENCOUNTER — Other Ambulatory Visit: Payer: Self-pay

## 2012-01-01 LAB — BASIC METABOLIC PANEL
BUN: 11 mg/dL (ref 6–23)
CO2: 29 mEq/L (ref 19–32)
Calcium: 9.1 mg/dL (ref 8.4–10.5)
Creatinine, Ser: 0.75 mg/dL (ref 0.50–1.35)
GFR calc non Af Amer: 88 mL/min — ABNORMAL LOW (ref >90)
Glucose, Bld: 102 mg/dL — ABNORMAL HIGH (ref 70–99)
Sodium: 139 mEq/L (ref 135–145)

## 2012-01-01 MED ORDER — POTASSIUM CHLORIDE CRYS ER 20 MEQ PO TBCR
20.0000 meq | EXTENDED_RELEASE_TABLET | Freq: Every day | ORAL | Status: DC
  Administered 2012-01-01 – 2012-01-02 (×2): 20 meq via ORAL
  Filled 2012-01-01 (×2): qty 1

## 2012-01-01 MED ORDER — POTASSIUM CHLORIDE CRYS ER 20 MEQ PO TBCR
EXTENDED_RELEASE_TABLET | ORAL | Status: AC
  Administered 2012-01-01: 09:00:00
  Filled 2012-01-01: qty 2

## 2012-01-01 NOTE — Progress Notes (Signed)
Pt c/o muscle cramping in neck; pt takes Robaxin at home; Robaxin given at this time; will cont. To monitor.

## 2012-01-01 NOTE — Progress Notes (Signed)
Pt up ambulating in hallway independently with wife at this time; will cont. To monitor.

## 2012-01-01 NOTE — Progress Notes (Signed)
Pt requesting stool softner this AM; pt given PO Colace; will cont. To monitor.

## 2012-01-01 NOTE — Progress Notes (Signed)
Patient converted to [name] 55. Patient in bed and resting. No complaints voiced.ekg confirm [name] R.N. Aware.

## 2012-01-01 NOTE — Progress Notes (Signed)
Pt up ambulating in hallway at this time without difficulty; will cont. To monitor.

## 2012-01-01 NOTE — Progress Notes (Signed)
   SUBJECTIVE: The patient is doing well today.  At this time, he denies chest pain, shortness of breath, or any new concerns.     Marland Kitchen atorvastatin  10 mg Oral Daily  . dofetilide  500 mcg Oral Q12H  . lisinopril  10 mg Oral Daily  . metoprolol succinate  25 mg Oral QHS  . metoprolol succinate  50 mg Oral Daily  . pantoprazole  40 mg Oral Q1200  . potassium chloride SA      . potassium chloride  20 mEq Oral Daily  . Rivaroxaban  20 mg Oral Daily  . sodium chloride  3 mL Intravenous Q12H  . Tamsulosin HCl  0.4 mg Oral Daily  . DISCONTD: diltiazem  120 mg Oral Daily  . DISCONTD: metoprolol succinate  25-50 mg Oral BID      OBJECTIVE: Physical Exam: Filed Vitals:   12/31/11 1356 12/31/11 1955 01/01/12 0408 01/01/12 0755  BP: 110/73 103/70 119/70 111/72  Pulse: 100 79 60 66  Temp: 98.9 F (37.2 C) 97.9 F (36.6 C) 97.8 F (36.6 C)   TempSrc: Oral Oral Oral   Resp: 18 18 20 20   Height: 6' (1.829 m)     Weight: 214 lb (97.07 kg)     SpO2: 98% 95% 93%     Intake/Output Summary (Last 24 hours) at 01/01/12 3086 Last data filed at 01/01/12 0400  Gross per 24 hour  Intake   1080 ml  Output    675 ml  Net    405 ml    Telemetry reveals sinus rhythm this am  GEN- The patient is well appearing, alert and oriented x 3 today.   Head- normocephalic, atraumatic Eyes-  Sclera clear, conjunctiva pink Ears- hearing intact Oropharynx- clear Neck- supple, no JVP Lymph- no cervical lymphadenopathy Lungs- Clear to ausculation bilaterally, normal work of breathing Heart- Regular rate and rhythm, no murmurs, rubs or gallops, PMI not laterally displaced GI- soft, NT, ND, + BS Extremities- no clubbing, cyanosis, or edema   LABS: Basic Metabolic Panel:  Basename 01/01/12 0600 12/31/11 0918  NA 139 144  K 3.8 4.2  CL 104 107  CO2 29 29  GLUCOSE 102* 100*  BUN 11 11  CREATININE 0.75 0.85  CALCIUM 9.1 9.5  MG 2.2 2.3  PHOS -- --  ASSESSMENT AND PLAN:  Principal Problem:  *Persistent atrial fibrillation Active Problems:  Hyperlipidemia  Hypertension  GERD (gastroesophageal reflux disease)  Left ventricular dysfunction  1. afib- converted to sinus with tikosyn ekg this am is reviewed and reveals sinus bradycardia 55 bpm with RBBB and QTc 484 Stop diltiazem for now  2. htn- stable  3. Hypokalemia- add KDur and follow  Hillis Range, MD 01/01/2012 8:29 AM

## 2012-01-01 NOTE — Care Management Note (Unsigned)
    Page 1 of 1   01/01/2012     10:35:16 AM   CARE MANAGEMENT NOTE 01/01/2012  Patient:  Connor [name], Connor [name]   Account Number:  [id number]  Date Initiated:  01/01/2012  Documentation initiated by:  SIMMONS,Alaina Donati  Subjective/Objective Assessment:   ADMITTED FOR TIKOSYN INITIATION; LIVES AT HOME WITH WIFE- MARGARET; WAS IPTA; NO DME; USES [facility] IN [place] FOR RX.     Action/Plan:   DISCHARGE PLANNING DISCUSSED AT BEDSIDE.   Anticipated DC Date:  01/03/2012   Anticipated DC Plan:  HOME/SELF CARE      DC Planning Services  CM consult      Choice offered to / List presented to:             Status of service:  In process, will continue to follow Medicare Important Message given?   (If response is "NO", the following Medicare IM given date fields will be blank) Date Medicare IM given:   Date Additional Medicare IM given:    Discharge Disposition:    Per UR Regulation:  Reviewed for med. necessity/level of care/duration of stay  If discussed at Long Length of Stay Meetings, dates discussed:    Comments:  01/01/12  1033  Kathyann Spaugh SIMMONS RN, BSN [phone] NCM CONTACTED PT'S PHARMACY AND THEY DO HAVE TIKOSYN IN STOCK- PT AWARE; MD- PLEASE WRITE RX FOR 7 DAYS SUPPLY OF TIKOSYN TO BE SENT TO MAIN PHARMACY AT D/C.  NCM WILL FOLLOW.

## 2012-01-02 DIAGNOSIS — I4892 Unspecified atrial flutter: Principal | ICD-10-CM

## 2012-01-02 LAB — BASIC METABOLIC PANEL
BUN: 13 mg/dL (ref 6–23)
CO2: 30 mEq/L (ref 19–32)
Calcium: 8.9 mg/dL (ref 8.4–10.5)
Glucose, Bld: 102 mg/dL — ABNORMAL HIGH (ref 70–99)
Sodium: 140 mEq/L (ref 135–145)

## 2012-01-02 LAB — CBC
MCH: 33.1 pg (ref 26.0–34.0)
MCHC: 33.3 g/dL (ref 30.0–36.0)
MCV: 99.5 fL (ref 78.0–100.0)
Platelets: 132 10*3/uL — ABNORMAL LOW (ref 150–400)
RBC: 4.23 MIL/uL (ref 4.22–5.81)

## 2012-01-02 MED ORDER — POTASSIUM CHLORIDE CRYS ER 20 MEQ PO TBCR
20.0000 meq | EXTENDED_RELEASE_TABLET | Freq: Two times a day (BID) | ORAL | Status: DC
  Administered 2012-01-02 – 2012-01-03 (×2): 20 meq via ORAL
  Filled 2012-01-02 (×2): qty 1

## 2012-01-02 MED ORDER — OCUVITE-LUTEIN PO CAPS
1.0000 | ORAL_CAPSULE | Freq: Every day | ORAL | Status: DC
  Administered 2012-01-02 – 2012-01-03 (×2): 1 via ORAL
  Filled 2012-01-02 (×2): qty 1

## 2012-01-02 MED ORDER — DOCUSATE SODIUM 100 MG PO CAPS
100.0000 mg | ORAL_CAPSULE | Freq: Two times a day (BID) | ORAL | Status: DC
  Administered 2012-01-02 – 2012-01-03 (×2): 100 mg via ORAL
  Filled 2012-01-02 (×3): qty 1

## 2012-01-02 NOTE — Progress Notes (Signed)
K 3.89 this AM; pt on Tikosyn; PA paged to make aware; will await callback.

## 2012-01-02 NOTE — Progress Notes (Signed)
Spoke with PA; pt already on schedule K; will administer scheduled K; will cont. To monitor.

## 2012-01-02 NOTE — Progress Notes (Signed)
   Subjective:  Remaining in NSR. K is slightly low 3.9  Objective:  Vital Signs in the last 24 hours: Temp:  [97.7 F (36.5 C)-98.3 F (36.8 C)] 97.9 F (36.6 C) (08/29 0519) Pulse Rate:  [58-64] 64  (08/29 0816) Resp:  [18] 18  (08/29 0816) BP: (98-111)/(59-72) 102/64 mmHg (08/29 0816) SpO2:  [95 %-99 %] 95 % (08/29 0816)  Intake/Output from previous day: 08/28 0701 - 08/29 0700 In: 1320 [P.O.:1320] Out: 2050 [Urine:2050] Intake/Output from this shift: Total I/O In: 480 [P.O.:480] Out: 700 [Urine:700]     . atorvastatin  10 mg Oral Daily  . dofetilide  500 mcg Oral Q12H  . lisinopril  10 mg Oral Daily  . metoprolol succinate  25 mg Oral QHS  . metoprolol succinate  50 mg Oral Daily  . pantoprazole  40 mg Oral Q1200  . potassium chloride  20 mEq Oral BID  . Rivaroxaban  20 mg Oral Daily  . sodium chloride  3 mL Intravenous Q12H  . Tamsulosin HCl  0.4 mg Oral Daily  . DISCONTD: potassium chloride  20 mEq Oral Daily      Physical Exam: The patient appears to be in no distress.  Head and neck exam reveals that the pupils are equal and reactive.  The extraocular movements are full.  There is no scleral icterus.  Mouth and pharynx are benign.  No lymphadenopathy.  No carotid bruits.  The jugular venous pressure is normal.  Thyroid is not enlarged or tender.  Chest is clear to percussion and auscultation.  No rales or rhonchi.  Expansion of the chest is symmetrical.  Heart reveals no abnormal lift or heave.  First and second heart sounds are normal.  There is no murmur gallop rub or click.  The abdomen is soft and nontender.  Bowel sounds are normoactive.  There is no hepatosplenomegaly or mass.  There are no abdominal bruits.  Extremities reveal no phlebitis or edema.  Pedal pulses are good.  There is no cyanosis or clubbing.  Neurologic exam is normal strength and no lateralizing weakness.  No sensory deficits.  Integument reveals no rash  Lab  Results:  Basename 01/02/12 0500  WBC 6.6  HGB 14.0  PLT 132*    Basename 01/02/12 0500 01/01/12 0600  NA 140 139  K 3.9 3.8  CL 104 104  CO2 30 29  GLUCOSE 102* 102*  BUN 13 11  CREATININE 0.84 0.75   No results found for this basename: TROPONINI:2,CK,MB:2 in the last 72 hours Hepatic Function Panel No results found for this basename: PROT,ALBUMIN,AST,ALT,ALKPHOS,BILITOT,BILIDIR,IBILI in the last 72 hours No results found for this basename: CHOL in the last 72 hours No results found for this basename: PROTIME in the last 72 hours  Imaging: Imaging results have been reviewed  Cardiac Studies: Telemetry shows NSR. QTc okay Assessment/Plan:  Paroxysmal AF now in NSR on Tikosyn.  Borderline low K  Plan: increase Kdur ot BID. Probably home in am.   LOS: 2 days    Cassell Clement 01/02/2012, 9:02 AM

## 2012-01-03 LAB — BASIC METABOLIC PANEL
BUN: 13 mg/dL (ref 6–23)
CO2: 31 mEq/L (ref 19–32)
Calcium: 9.2 mg/dL (ref 8.4–10.5)
Chloride: 105 mEq/L (ref 96–112)
Creatinine, Ser: 0.92 mg/dL (ref 0.50–1.35)

## 2012-01-03 MED ORDER — POTASSIUM CHLORIDE CRYS ER 20 MEQ PO TBCR: 20.0000 meq | EXTENDED_RELEASE_TABLET | Freq: Two times a day (BID) | ORAL | Status: DC

## 2012-01-03 MED ORDER — DOFETILIDE 500 MCG PO CAPS: 500.0000 ug | ORAL_CAPSULE | Freq: Two times a day (BID) | ORAL | Status: DC

## 2012-01-03 NOTE — Progress Notes (Signed)
D/c instructions along with medlist and scripts provided. Tikosyn prescription filled and given to patient. Patient refused transport to lobby. Ambulated with wife. Connor [name]

## 2012-01-03 NOTE — Progress Notes (Addendum)
   Subjective:  Remaining in NSR. Has completed 3 days of inpatient Tikosyn loading. Heart rate  60.  QTc 440 msec. K is 4.2 Feels well and anxious to go home.  Objective:  Vital Signs in the last 24 hours: Temp:  [97.6 F (36.4 C)-98.1 F (36.7 C)] 97.6 F (36.4 C) (08/30 0535) Pulse Rate:  [58-72] 61  (08/30 0535) Resp:  [18] 18  (08/30 0535) BP: (97-117)/(59-64) 102/60 mmHg (08/30 0535) SpO2:  [94 %-97 %] 95 % (08/30 0535)  Intake/Output from previous day: 08/29 0701 - 08/30 0700 In: 1080 [P.O.:1080] Out: 1551 [Urine:1550; Stool:1] Intake/Output from this shift:       . atorvastatin  10 mg Oral Daily  . docusate sodium  100 mg Oral BID  . dofetilide  500 mcg Oral Q12H  . lisinopril  10 mg Oral Daily  . metoprolol succinate  25 mg Oral QHS  . metoprolol succinate  50 mg Oral Daily  . multivitamin-lutein  1 capsule Oral Daily  . pantoprazole  40 mg Oral Q1200  . potassium chloride  20 mEq Oral BID  . Rivaroxaban  20 mg Oral Daily  . sodium chloride  3 mL Intravenous Q12H  . Tamsulosin HCl  0.4 mg Oral Daily  . DISCONTD: potassium chloride  20 mEq Oral Daily      Physical Exam: The patient appears to be in no distress.  Head and neck exam reveals that the pupils are equal and reactive.  The extraocular movements are full.  There is no scleral icterus.  Mouth and pharynx are benign.  No lymphadenopathy.  No carotid bruits.  The jugular venous pressure is normal.  Thyroid is not enlarged or tender.  Chest is clear to percussion and auscultation.  No rales or rhonchi.  Expansion of the chest is symmetrical.  Heart reveals no abnormal lift or heave.  First and second heart sounds are normal.  There is no murmur gallop rub or click.  The abdomen is soft and nontender.  Bowel sounds are normoactive.  There is no hepatosplenomegaly or mass.  There are no abdominal bruits.  Extremities reveal no phlebitis or edema.  Pedal pulses are good.  There is no cyanosis or  clubbing.  Neurologic exam is normal strength and no lateralizing weakness.  No sensory deficits.  Integument reveals no rash  Lab Results:  Basename 01/02/12 0500  WBC 6.6  HGB 14.0  PLT 132*    Basename 01/03/12 0540 01/02/12 0500  NA 143 140  K 4.2 3.9  CL 105 104  CO2 31 30  GLUCOSE 98 102*  BUN 13 13  CREATININE 0.92 0.84   No results found for this basename: TROPONINI:2,CK,MB:2 in the last 72 hours Hepatic Function Panel No results found for this basename: PROT,ALBUMIN,AST,ALT,ALKPHOS,BILITOT,BILIDIR,IBILI in the last 72 hours No results found for this basename: CHOL in the last 72 hours No results found for this basename: PROTIME in the last 72 hours  Imaging: Imaging results have been reviewed  Cardiac Studies: Telemetry shows NSR. QTc 0.440 Assessment/Plan:  Paroxysmal Atrial flutter now in NSR on Tikosyn.  Okay for discharge. Prescriptions written for Tikosyn in shadow chart. Has appt to see Dr. Johney Frame on Sept 9. Recheck BMET and Mg then.   LOS: 3 days    Cassell Clement 01/03/2012, 8:01 AM

## 2012-01-03 NOTE — Discharge Summary (Signed)
CARDIOLOGY DISCHARGE SUMMARY   Patient ID: Connor [name] MRN: [id number] DOB/AGE: [birth year] 78 y.o.  Admit date: 12/31/2011 Discharge date: 01/03/2012  Primary Discharge Diagnosis:  Atrial flutter Secondary Discharge Diagnosis:  Past Medical History  Diagnosis Date  . Hypertension   . Atrial flutter     s/p ablation in March 2012  . Hyperlipidemia   . Vitamin d deficiency   . RBBB (right bundle branch block)   . BPH (benign prostatic hyperplasia)   . Osteoarthritis   . Anemia   . Peptic ulcer disease   . DVT (deep venous thrombosis) 2/12    in setting of R hip surgery, he developed R femoral DVT  . Chronic anticoagulation     on Xarelto fo afib  . Abnormal nuclear cardiac imaging test 1/13    Moderate size inferior and apical defect with partial reversibility in the inferior wall suggestive of prior inferior and apical infarct vs thinning; mild inferior ischemia. Not gated due to ectopy.   . Paroxysmal atrial fibrillation 9/12    documented by Dr Otho Perl on office EKG 9/12  . Premature ventricular contraction    Procedures: Serial ECGs  Hospital Course: Connor [name] is a 78 year old male with a history of persistent atrial fibrillation/flutter. He has been compliant with his Xarelto and was evaluated by Dr. Johney Frame. Tikosyn was indicated and he came to the hospital for Tikosyn loading on 12/31/2011.  His potassium was 3.7 on admission and was supplemented. It was supplemented and followed closely during his hospital stay was 4.2 at discharge. This will be followed as an outpatient. His magnesium was in the appropriate range of 2.2 and remained greater than 2.0 during his stay. His ECGs were followed closely. His QTC did not prolong on a dose of 500 mcg every 12 hours.  He spontaneously converted to sinus rhythm/sinus bradycardia with a right bundle branch block. His diltiazem was discontinued but he tolerated the Toprol at his home dose. His discharge and had also been  discontinued. He ambulated and had no problems with ambulation.  He remained in sinus rhythm and had no ventricular tachycardia/torsades points on the Tikosyn. On 01/03/2012, he was seen by Dr. Patty Sermons. He was maintaining sinus rhythm and ambulating without difficulty. Dr. Patty Sermons considered stable for discharge, to follow up as an outpatient.  Labs:   Lab Results  Component Value Date   WBC 6.6 01/02/2012   HGB 14.0 01/02/2012   HCT 42.1 01/02/2012   MCV 99.5 01/02/2012   PLT 132* 01/02/2012    Lab 01/03/12 0540  NA 143  K 4.2  CL 105  CO2 31  BUN 13  CREATININE 0.92  CALCIUM 9.2  PROT --  BILITOT --  ALKPHOS --  ALT --  AST --  GLUCOSE 98   EKG: 01-Jan-2012 22:24:35   Sinus rhythm with occasional Premature ventricular complexes Right bundle branch block Abnormal ECG Since previous tracing Premature ventricular complexes are new. 105mm/s 47mm/mV 100Hz  8.0.1 12SL 241 HD CID: [id number] Referred by: Confirmed By: PETER [place] MD Vent. rate 60 BPM PR interval 162 ms QRS duration 148 ms QT/QTc 498/498 ms P-R-T axes 41 31 -2  FOLLOW UP PLANS AND APPOINTMENTS Allergies  Allergen Reactions  . Penicillins Itching and Swelling   Medication List  As of 01/03/2012  9:31 AM   STOP taking these medications         digoxin 0.125 MG tablet      diltiazem 120 MG 24 hr capsule  TAKE these medications         atorvastatin 10 MG tablet   Commonly known as: LIPITOR   Take 10 mg by mouth daily.      CALCIUM 500 PO   Take 500 mg by mouth 2 (two) times daily.      dofetilide 500 MCG capsule   Commonly known as: TIKOSYN   Take 1 capsule (500 mcg total) by mouth every 12 (twelve) hours.      FLOMAX 0.4 MG Caps   Generic drug: Tamsulosin HCl   Take 0.4 mg by mouth daily.      ICAPS PO   Take 1 tablet by mouth daily.      lisinopril 10 MG tablet   Commonly known as: PRINIVIL,ZESTRIL   Take 10 mg by mouth daily.      methocarbamol 500 MG tablet   Commonly known as:  ROBAXIN   Take 500 mg by mouth 3 (three) times daily as needed. For pain      metoprolol succinate 50 MG 24 hr tablet   Commonly known as: TOPROL-XL   Take 25-50 mg by mouth daily. Take 1 tablet in the morning and 1/2 in the evening      multivitamin Liqd   Take 5 mLs by mouth daily.      OMEGA 3 PO   Take 1 capsule by mouth daily.      omeprazole 20 MG capsule   Commonly known as: PRILOSEC   Take 20 mg by mouth daily.      potassium chloride SA 20 MEQ tablet   Commonly known as: K-DUR,KLOR-CON   Take 1 tablet (20 mEq total) by mouth 2 (two) times daily.      Rivaroxaban 20 MG Tabs   Commonly known as: XARELTO   Take 1 tablet by mouth daily.      STOOL SOFTENER PO   Take 1 capsule by mouth daily.      traMADol 50 MG tablet   Commonly known as: ULTRAM   Take 50 mg by mouth every 6 (six) hours as needed. For pain      Vitamin D3 5000 UNITS Tabs   Take 5,000 Units by mouth daily.            Discharge Orders    Future Appointments: Provider: Department: Dept Phone: Center:   01/13/2012 10:00 AM Hillis Range, MD [facility] [phone] [facility]   02/24/2012 10:15 AM Cassell Clement, MD [facility] [phone] None     Follow-up Information    Follow up with Hillis Range, MD. (September 9th at 10:00 am)    Contact information:   [address] [place], [address] [place] [address] [phone]          BRING ALL MEDICATIONS WITH YOU TO FOLLOW UP APPOINTMENTS  Time spent with patient to include physician time: 35 min Signed: Theodore Demark 01/03/2012, 9:15 AM Co-Sign MD

## 2012-01-07 ENCOUNTER — Telehealth: Payer: Self-pay | Admitting: Cardiology

## 2012-01-07 NOTE — Telephone Encounter (Signed)
Connor [name] out of rhythm on Saturday and thinks was still out this am. Does seem like it has settled down and heart rate 67 and blood pressure 114/70. Doesn't always know when he is out of rhythm with his heart beating but has decreased energy, feels ok right now.  Is out of town and heading back to town now.  Is taking Tikosyn 8 am and 8 pm.  Will forward to  Dr. Patty Sermons for review

## 2012-01-07 NOTE — Telephone Encounter (Signed)
New Problem:    Called in wanting to let you know that his heart had gone out of rhythm this past Saturday while he was in the hospital receiving dofetilide (TIKOSYN) 500 MCG capsule.  Please call back.

## 2012-01-07 NOTE — Telephone Encounter (Signed)
When called patient back he stated heart rate up to 105 but he had just taken a Metoprolol. Discussed with  Dr. Patty Sermons and ok to use Metoprolol as needed.  Will have patient rest and continue current regimen.  He will call back tomorrow with update.  If he continues to be out of rhythm will get him in to be seen sometime this week

## 2012-01-07 NOTE — Telephone Encounter (Signed)
The patient should just make sure that he takes his Tikosyn twice a day as directed.  He is on the maximum dose of Tikosyn.  Often the Tikosyn will put it back in rhythm if he drops out of rhythm briefly.  If he still thinks he is out of rhythm Wednesday consider office visit.

## 2012-01-08 ENCOUNTER — Telehealth: Payer: Self-pay | Admitting: Cardiology

## 2012-01-08 NOTE — Telephone Encounter (Signed)
Agree with plan as noted.

## 2012-01-08 NOTE — Telephone Encounter (Signed)
Pt rtn  call to melinda, with update on a-fib,  pls call [phone]

## 2012-01-08 NOTE — Telephone Encounter (Signed)
Heart rate 99 this am before meds.  About an hour later it was only down to 93 but still feels like it is out of rhythm. Spoke with patient around 10:30 am.   Discussed with  Dr. Patty Sermons and will have patient use extra Metoprolol 25 mg up to twice a day if heart rate above 85 and keep appointment with Dr Johney Frame on Monday 01/13/12. When called patient back this afternoon heart rate down to 83  Patient will call back with update tomorrow

## 2012-01-09 ENCOUNTER — Telehealth: Payer: Self-pay | Admitting: Cardiology

## 2012-01-09 NOTE — Telephone Encounter (Signed)
Good news.

## 2012-01-09 NOTE — Telephone Encounter (Signed)
Spoke with patient and about 9:00 am this morning he went back into NSR.  Just wanted to let  Dr. Patty Sermons know, will forward to him.

## 2012-01-09 NOTE — Telephone Encounter (Signed)
Pt rtn call to melinda °

## 2012-01-13 ENCOUNTER — Ambulatory Visit (INDEPENDENT_AMBULATORY_CARE_PROVIDER_SITE_OTHER): Payer: Medicare Other | Admitting: Internal Medicine

## 2012-01-13 ENCOUNTER — Encounter: Payer: Self-pay | Admitting: Internal Medicine

## 2012-01-13 VITALS — BP 110/73 | HR 68 | Ht 72.0 in | Wt 210.1 lb

## 2012-01-13 DIAGNOSIS — I519 Heart disease, unspecified: Secondary | ICD-10-CM

## 2012-01-13 DIAGNOSIS — I4891 Unspecified atrial fibrillation: Secondary | ICD-10-CM

## 2012-01-13 DIAGNOSIS — I4819 Other persistent atrial fibrillation: Secondary | ICD-10-CM

## 2012-01-13 LAB — BASIC METABOLIC PANEL
CO2: 28 mEq/L (ref 19–32)
Calcium: 9.2 mg/dL (ref 8.4–10.5)
Creatinine, Ser: 0.8 mg/dL (ref 0.4–1.5)
Glucose, Bld: 98 mg/dL (ref 70–99)

## 2012-01-13 MED ORDER — METOPROLOL TARTRATE 25 MG PO TABS: ORAL_TABLET | ORAL | Status: DC

## 2012-01-13 NOTE — Patient Instructions (Addendum)
Your physician recommends that you schedule a follow-up appointment in: 4 weeks with Dr Johney Frame   Your physician has requested that you have a lower or upper extremity venous duplex. This test is an ultrasound of the veins in the legs or arms. It looks at venous blood flow that carries blood from the heart to the legs or arms. Allow one hour for a Lower Venous exam. Allow thirty minutes for an Upper Venous exam. There are no restrictions or special instructions.---Lower  right ---Pt had DVT 09/2010    Your physician recommends that you return for lab work BMP/MAG   Your physician has recommended you make the following change in your medication:  1) Start Lopressor 25 mg ---take 1 tablet every 6 hours as needed for irregular heart beats

## 2012-01-13 NOTE — Assessment & Plan Note (Signed)
Improved but not resolved with tikosyn Our options are to continue tikosyn, switch to amiodarone, or consider ablation He would like to continue tikosyn for now. Check BMET and Mg today Metoprolol 25mg  q6 prn during afib   Given prior DVT of R femoral vein, I am not certain that he would be a candidate for ablation.  I will order R femoral venous mapping to further assess this in case we decide to pursue ablation down the road.

## 2012-01-13 NOTE — Assessment & Plan Note (Signed)
Hopefully will resolve with sinus rhythm Medical therapy per Dr Patty Sermons

## 2012-01-13 NOTE — Progress Notes (Signed)
PCP:  Connor Littler, MD  The patient presents today for routine electrophysiology followup.  Since starting tikosyn, he reports ongoing intermittent afib.  He feels that episodes are improved.  During afib, he reports feeling washed out and very fatigued.  Today, he denies symptoms of  chest pain, shortness of breath, orthopnea, PND, lower extremity edema, dizziness, presyncope, syncope, or neurologic sequela.  The patient feels that he is tolerating medications without difficulties and is otherwise without complaint today.   Past Medical History  Diagnosis Date  . Hypertension   . Atrial flutter     s/p ablation in March 2012  . Hyperlipidemia   . Vitamin d deficiency   . RBBB (right bundle branch block)   . BPH (benign prostatic hyperplasia)   . Osteoarthritis   . Anemia   . Peptic ulcer disease   . DVT (deep venous thrombosis) 2/12    in setting of R hip surgery, he developed R femoral DVT  . Chronic anticoagulation     on Xarelto fo afib  . Abnormal nuclear cardiac imaging test 1/13    Moderate size inferior and apical defect with partial reversibility in the inferior wall suggestive of prior inferior and apical infarct vs thinning; mild inferior ischemia. Not gated due to ectopy.   . Paroxysmal atrial fibrillation 9/12    documented by Dr Otho Perl on office EKG 9/12  . Premature ventricular contraction    Past Surgical History  Procedure Date  . Total hip arthroplasty 06/04/10    RIGHT HIP  . Total knee arthroplasty   . Transurethral resection of prostate   . Atrial flutter ablation 5/12    by JA  . Cardiac catheterization     Current Outpatient Prescriptions  Medication Sig Dispense Refill  . atorvastatin (LIPITOR) 10 MG tablet Take 10 mg by mouth daily.        . Calcium Carbonate (CALCIUM 500 PO) Take 500 mg by mouth 2 (two) times daily.       . Cholecalciferol (VITAMIN D3) 5000 UNITS TABS Take 5,000 Units by mouth daily.        [name] Calcium (STOOL SOFTENER PO)  Take 1 capsule by mouth daily.       Marland Kitchen dofetilide (TIKOSYN) 500 MCG capsule Take 1 capsule (500 mcg total) by mouth every 12 (twelve) hours.  60 capsule  11  . lisinopril (PRINIVIL,ZESTRIL) 10 MG tablet Take 10 mg by mouth daily.      . methocarbamol (ROBAXIN) 500 MG tablet Take 500 mg by mouth 3 (three) times daily as needed. For pain      . metoprolol succinate (TOPROL-XL) 50 MG 24 hr tablet Take 25-50 mg by mouth daily. Take 1 tablet in the morning and 1/2 in the evening      . Multiple Vitamin (MULITIVITAMIN) LIQD Take 5 mLs by mouth daily.      . Multiple Vitamins-Minerals (ICAPS PO) Take 1 tablet by mouth daily.       . Omega-3 Fatty Acids (OMEGA 3 PO) Take 1 capsule by mouth daily.       Marland Kitchen omeprazole (PRILOSEC) 20 MG capsule Take 20 mg by mouth daily.        . potassium chloride SA (K-DUR,KLOR-CON) 20 MEQ tablet Take 1 tablet (20 mEq total) by mouth 2 (two) times daily.  60 tablet  11  . Rivaroxaban (XARELTO) 20 MG TABS Take 1 tablet by mouth daily.  30 tablet  7  . Tamsulosin HCl (FLOMAX) 0.4 MG  CAPS Take 0.4 mg by mouth daily.        . traMADol (ULTRAM) 50 MG tablet Take 50 mg by mouth every 6 (six) hours as needed. For pain        Allergies  Allergen Reactions  . Penicillins Itching and Swelling    History   Social History  . Marital Status: Married    Spouse Name: N/A    Number of Children: N/A  . Years of Education: N/A   Occupational History  . Not on file.   Social History Main Topics  . Smoking status: Former Smoker    Types: Cigarettes    Quit date: 05/06/1978  . Smokeless tobacco: Never Used  . Alcohol Use: 3.0 oz/week    5 Cans of beer per week  . Drug Use: No  . Sexually Active: Not on file   Other Topics Concern  . Not on file   Social History Narrative   he patient lives in [place] with his spouse    Family History  Problem Relation Age of Onset  . Heart attack Father   . Lung disease Mother   . Atrial fibrillation Brother   . Arrhythmia  Sister    Physical Exam: Filed Vitals:   01/13/12 1025  BP: 110/73  Pulse: 68  Height: 6' (1.829 m)  Weight: 210 lb 1.9 oz (95.31 kg)    GEN- The patient is well appearing, alert and oriented x 3 today.   Head- normocephalic, atraumatic Eyes-  Sclera clear, conjunctiva pink Ears- hearing intact Oropharynx- clear Neck- supple, no JVP Lymph- no cervical lymphadenopathy Lungs- Clear to ausculation bilaterally, normal work of breathing Heart- Regular rate and rhythm, no murmurs, rubs or gallops, PMI not laterally displaced GI- soft, NT, ND, + BS Extremities- no clubbing, cyanosis, +1 edema  ekg today reveals sinus rhythm 68 bpm, RBBB, Qtc 510 (QRS duration is 148)  Assessment and Plan:

## 2012-01-17 ENCOUNTER — Telehealth: Payer: Self-pay | Admitting: Internal Medicine

## 2012-01-17 NOTE — Telephone Encounter (Signed)
New Problem:     Patient would like an ultra sound of his left leg on Monday as well.  Please call back.

## 2012-01-17 NOTE — Telephone Encounter (Signed)
Spoke with pt, he is aware of appt Monday for dopplers. Let the tech know the pt would like both legs scanned.

## 2012-01-20 ENCOUNTER — Encounter (INDEPENDENT_AMBULATORY_CARE_PROVIDER_SITE_OTHER): Payer: Medicare Other

## 2012-01-20 DIAGNOSIS — I4819 Other persistent atrial fibrillation: Secondary | ICD-10-CM

## 2012-01-20 DIAGNOSIS — Z86718 Personal history of other venous thrombosis and embolism: Secondary | ICD-10-CM

## 2012-01-20 DIAGNOSIS — I87009 Postthrombotic syndrome without complications of unspecified extremity: Secondary | ICD-10-CM

## 2012-01-20 DIAGNOSIS — I519 Heart disease, unspecified: Secondary | ICD-10-CM

## 2012-02-17 ENCOUNTER — Ambulatory Visit (INDEPENDENT_AMBULATORY_CARE_PROVIDER_SITE_OTHER): Payer: Medicare Other | Admitting: Internal Medicine

## 2012-02-17 ENCOUNTER — Encounter: Payer: Self-pay | Admitting: Internal Medicine

## 2012-02-17 VITALS — BP 114/70 | HR 64 | Ht 72.0 in | Wt 213.0 lb

## 2012-02-17 DIAGNOSIS — I4891 Unspecified atrial fibrillation: Secondary | ICD-10-CM

## 2012-02-17 DIAGNOSIS — I4892 Unspecified atrial flutter: Secondary | ICD-10-CM

## 2012-02-17 DIAGNOSIS — I519 Heart disease, unspecified: Secondary | ICD-10-CM

## 2012-02-17 DIAGNOSIS — I4819 Other persistent atrial fibrillation: Secondary | ICD-10-CM

## 2012-02-17 NOTE — Assessment & Plan Note (Signed)
EF is chronically depressed euvolemic today, though chronic venous stasis changes Support hose advised Follow-up with Dr Patty Sermons for further management

## 2012-02-17 NOTE — Assessment & Plan Note (Signed)
Doing well with tikosyn No changes today  Consider ablation if his afib burden increases

## 2012-02-17 NOTE — Patient Instructions (Signed)
Your physician wants you to follow-up in: 6 months with Dr. Allred. You will receive a reminder letter in the mail two months in advance. If you don't receive a letter, please call our office to schedule the follow-up appointment.  

## 2012-02-17 NOTE — Progress Notes (Signed)
Primary Cardiologist:  Dr Patty Sermons  PCP:  Reather Littler, MD  The patient presents today for routine electrophysiology followup.  Since his last visit, he feels that his afib has improved.   Today, he denies symptoms of  chest pain, shortness of breath, orthopnea, PND, lower extremity edema, dizziness, presyncope, syncope, or neurologic sequela.  The patient feels that he is tolerating medications without difficulties and is otherwise without complaint today.   Past Medical History  Diagnosis Date  . Hypertension   . Atrial flutter     s/p ablation in March 2012  . Hyperlipidemia   . Vitamin D deficiency   . RBBB (right bundle branch block)   . BPH (benign prostatic hyperplasia)   . Osteoarthritis   . Anemia   . Peptic ulcer disease   . DVT (deep venous thrombosis) 2/12    in setting of R hip surgery, he developed R femoral DVT  . Chronic anticoagulation     on Xarelto fo afib  . Abnormal nuclear cardiac imaging test 1/13    Moderate size inferior and apical defect with partial reversibility in the inferior wall suggestive of prior inferior and apical infarct vs thinning; mild inferior ischemia. Not gated due to ectopy.   . Paroxysmal atrial fibrillation 9/12    documented by Dr Otho Perl on office EKG 9/12  . Premature ventricular contraction    Past Surgical History  Procedure Date  . Total hip arthroplasty 06/04/10    RIGHT HIP  . Total knee arthroplasty   . Transurethral resection of prostate   . Atrial flutter ablation 5/12    by JA  . Cardiac catheterization     Current Outpatient Prescriptions  Medication Sig Dispense Refill  . atorvastatin (LIPITOR) 10 MG tablet Take 10 mg by mouth daily.        . Calcium Carbonate (CALCIUM 500 PO) Take 500 mg by mouth 2 (two) times daily.       . Cholecalciferol (VITAMIN D3) 5000 UNITS TABS Take 5,000 Units by mouth daily.        [name] Calcium (STOOL SOFTENER PO) Take 1 capsule by mouth daily.       Marland Kitchen dofetilide (TIKOSYN) 500 MCG  capsule Take 1 capsule (500 mcg total) by mouth every 12 (twelve) hours.  60 capsule  11  . lisinopril (PRINIVIL,ZESTRIL) 10 MG tablet Take 10 mg by mouth daily.      . methocarbamol (ROBAXIN) 500 MG tablet Take 500 mg by mouth 3 (three) times daily as needed. For pain      . metoprolol succinate (TOPROL-XL) 50 MG 24 hr tablet Take 25-50 mg by mouth daily. Take 1 tablet in the morning and 1/2 in the evening      . metoprolol tartrate (LOPRESSOR) 25 MG tablet Take 1 tablet every 6 hours as needed for fast heart rates  30 tablet  2  . Multiple Vitamin (MULITIVITAMIN) LIQD Take 5 mLs by mouth daily.      . Multiple Vitamins-Minerals (ICAPS PO) Take 1 tablet by mouth daily.       . Omega-3 Fatty Acids (OMEGA 3 PO) Take 1 capsule by mouth daily.       Marland Kitchen omeprazole (PRILOSEC) 20 MG capsule Take 20 mg by mouth daily.        . potassium chloride SA (K-DUR,KLOR-CON) 20 MEQ tablet Take 1 tablet (20 mEq total) by mouth 2 (two) times daily.  60 tablet  11  . Rivaroxaban (XARELTO) 20 MG TABS Take 1 tablet  by mouth daily.  30 tablet  7  . Tamsulosin HCl (FLOMAX) 0.4 MG CAPS Take 0.4 mg by mouth daily.        . traMADol (ULTRAM) 50 MG tablet Take 50 mg by mouth every 6 (six) hours as needed. For pain        Allergies  Allergen Reactions  . Penicillins Itching and Swelling    History   Social History  . Marital Status: Married    Spouse Name: N/A    Number of Children: N/A  . Years of Education: N/A   Occupational History  . Not on file.   Social History Main Topics  . Smoking status: Former Smoker    Types: Cigarettes    Quit date: 05/06/1978  . Smokeless tobacco: Never Used  . Alcohol Use: 3.0 oz/week    5 Cans of beer per week  . Drug Use: No  . Sexually Active: Not on file   Other Topics Concern  . Not on file   Social History Narrative   he patient lives in [place] with his spouse    Family History  Problem Relation Age of Onset  . Heart attack Father   . Lung disease  Mother   . Atrial fibrillation Brother   . Arrhythmia Sister    Physical Exam: Filed Vitals:   02/17/12 1215  BP: 114/70  Pulse: 64  Height: 6' (1.829 m)  Weight: 213 lb (96.616 kg)    GEN- The patient is well appearing, alert and oriented x 3 today.   Head- normocephalic, atraumatic Eyes-  Sclera clear, conjunctiva pink Ears- hearing intact Oropharynx- clear Neck- supple, no JVP Lymph- no cervical lymphadenopathy Lungs- Clear to ausculation bilaterally, normal work of breathing Heart- Regular rate and rhythm, no murmurs, rubs or gallops, PMI not laterally displaced GI- soft, NT, ND, + BS Extremities- no clubbing, cyanosis, +1 edema  ekg today reveals sinus rhythm 68 bpm, RBBB, Qtc 511 (QRS duration is 140)  Echo reviewed  Assessment and Plan:

## 2012-02-24 ENCOUNTER — Encounter: Payer: Self-pay | Admitting: Cardiology

## 2012-02-24 ENCOUNTER — Ambulatory Visit (INDEPENDENT_AMBULATORY_CARE_PROVIDER_SITE_OTHER): Payer: Medicare Other | Admitting: Cardiology

## 2012-02-24 VITALS — BP 109/70 | HR 69 | Ht 72.0 in | Wt 214.8 lb

## 2012-02-24 DIAGNOSIS — E7800 Pure hypercholesterolemia, unspecified: Secondary | ICD-10-CM

## 2012-02-24 DIAGNOSIS — E785 Hyperlipidemia, unspecified: Secondary | ICD-10-CM

## 2012-02-24 DIAGNOSIS — I480 Paroxysmal atrial fibrillation: Secondary | ICD-10-CM

## 2012-02-24 DIAGNOSIS — I4891 Unspecified atrial fibrillation: Secondary | ICD-10-CM

## 2012-02-24 DIAGNOSIS — I119 Hypertensive heart disease without heart failure: Secondary | ICD-10-CM

## 2012-02-24 DIAGNOSIS — I4510 Unspecified right bundle-branch block: Secondary | ICD-10-CM

## 2012-02-24 DIAGNOSIS — I519 Heart disease, unspecified: Secondary | ICD-10-CM

## 2012-02-24 DIAGNOSIS — I10 Essential (primary) hypertension: Secondary | ICD-10-CM

## 2012-02-24 DIAGNOSIS — I4892 Unspecified atrial flutter: Secondary | ICD-10-CM

## 2012-02-24 NOTE — Assessment & Plan Note (Signed)
He checks his blood pressure at home several times a day.  Her pressure has been remaining stable

## 2012-02-24 NOTE — Progress Notes (Signed)
Connor [name] Date of Birth:  1934-04-14 [facility] [address] [place], [place]  [address] [phone]  Fax   [phone]  HPI:  This pleasant 77 year old gentleman is seen for a scheduled followup office visit. He has a history of recurrent atrial fibrillation. He has had previous ablation procedure by Dr. Johney Frame for atrial flutter. He is on long-term Xarelto for chronic anticoagulation. He has a history of high blood pressure and hypercholesterolemia. He has had a previous deep vein thrombosis in the setting of right hip surgery.  He went back into atrial fibrillation in mid July 2013. He has  less energy and has been more short of breath whenever he is not in sinus rhythm.  Since his last visit with me he was hospitalized for Tikosyn loading.  He converted after initial doses and has been on Tikosyn.  He had only one recent episode of recurrent atrial fibrillation which occurred on and [place]   this occurred on September 27 when he was riding a horse which was out of control and he became very frightened.  He remained in atrial fibrillation for a day and a half and then went back into sinus rhythm. Since then he has had no further problems.  Current Outpatient Prescriptions  Medication Sig Dispense Refill  . atorvastatin (LIPITOR) 10 MG tablet Take 10 mg by mouth daily.        . Calcium Carbonate (CALCIUM 500 PO) Take 500 mg by mouth 2 (two) times daily.       . Cholecalciferol (VITAMIN D3) 5000 UNITS TABS Take 5,000 Units by mouth daily.        [name] Calcium (STOOL SOFTENER PO) Take 1 capsule by mouth daily.       Marland Kitchen dofetilide (TIKOSYN) 500 MCG capsule Take 1 capsule (500 mcg total) by mouth every 12 (twelve) hours.  60 capsule  11  . lisinopril (PRINIVIL,ZESTRIL) 10 MG tablet Take 10 mg by mouth daily.      . methocarbamol (ROBAXIN) 500 MG tablet Take 500 mg by mouth 3 (three) times daily as needed. For pain      .  metoprolol succinate (TOPROL-XL) 50 MG 24 hr tablet Take 25-50 mg by mouth daily. Take 1 tablet in the morning and 1/2 in the evening      . metoprolol tartrate (LOPRESSOR) 25 MG tablet Take 1 tablet every 6 hours as needed for fast heart rates  30 tablet  2  . Multiple Vitamin (MULITIVITAMIN) LIQD Take 5 mLs by mouth daily.      . Multiple Vitamins-Minerals (ICAPS PO) Take 1 tablet by mouth daily.       . Omega-3 Fatty Acids (OMEGA 3 PO) Take 1 capsule by mouth daily.       Marland Kitchen omeprazole (PRILOSEC) 20 MG capsule Take 20 mg by mouth daily.        . potassium chloride SA (K-DUR,KLOR-CON) 20 MEQ tablet Take 1 tablet (20 mEq total) by mouth 2 (two) times daily.  60 tablet  11  . Rivaroxaban (XARELTO) 20 MG TABS Take 1 tablet by mouth daily.  30 tablet  7  . Tamsulosin HCl (FLOMAX) 0.4 MG CAPS Take 0.4 mg by mouth daily.        . traMADol (ULTRAM) 50 MG tablet Take 50 mg by mouth every 6 (six) hours as needed. For pain        Allergies  Allergen Reactions  . Penicillins  Itching and Swelling    Patient Active Problem List  Diagnosis  . Atrial flutter  . Hyperlipidemia  . Hypertension  . Osteoarthritis  . BPH (benign prostatic hyperplasia)  . GERD (gastroesophageal reflux disease)  . Persistent atrial fibrillation  . PVC (premature ventricular contraction)  . Fatigue  . Low back pain  . Left ventricular dysfunction    History  Smoking status  . Former Smoker  . Types: Cigarettes  . Quit date: 05/06/1978  Smokeless tobacco  . Never Used    History  Alcohol Use  . 3.0 oz/week  . 5 Cans of beer per week    Family History  Problem Relation Age of Onset  . Heart attack Father   . Lung disease Mother   . Atrial fibrillation Brother   . Arrhythmia Sister     Review of Systems: The patient denies any heat or cold intolerance.  No weight gain or weight loss.  The patient denies headaches or blurry vision.  There is no cough or sputum production.  The patient denies dizziness.   There is no hematuria or hematochezia.  The patient denies any muscle aches or arthritis.  The patient denies any rash.  The patient denies frequent falling or instability.  There is no history of depression or anxiety.  All other systems were reviewed and are negative.   Physical Exam: Filed Vitals:   02/24/12 1012  BP: 109/70  Pulse: 69   general appearance reveals a well-developed well-nourished gentleman in no distress.The head and neck exam reveals pupils equal and reactive.  Extraocular movements are full.  There is no scleral icterus.  The mouth and pharynx are normal.  The neck is supple.  The carotids reveal no bruits.  The jugular venous pressure is normal.  The  thyroid is not enlarged.  There is no lymphadenopathy.  The chest is clear to percussion and auscultation.  There are no rales or rhonchi.  Expansion of the chest is symmetrical.  The precordium is quiet.  The first heart sound is normal.  The second heart sound is physiologically split.  There is no murmur gallop rub or click.  There is no abnormal lift or heave.  The abdomen is soft and nontender.  The bowel sounds are normal.  The liver and spleen are not enlarged.  There are no abdominal masses.  There are no abdominal bruits.  Extremities reveal good pedal pulses.  There is no phlebitis or edema.  There is no cyanosis or clubbing.  Strength is normal and symmetrical in all extremities.  There is no lateralizing weakness.  There are no sensory deficits.  The skin is warm and dry.  There is no rash.   EKG today confirms normal sinus rhythm with right bundle branch block   Assessment / Plan: Recheck in 3 months for followup office visit EKG basal metabolic panel and serum magnesium.  Get echocardiogram before that visit to evaluate LV function

## 2012-02-24 NOTE — Assessment & Plan Note (Signed)
Patient has hyperlipidemia and is on Lipitor 10 mg a day.  Is not having any myalgias from Lipitor

## 2012-02-24 NOTE — Assessment & Plan Note (Signed)
The patient is tolerating his Tikosyn.  He is on Xarelto for chronic long-term anticoagulation.  He has been in rhythm since September 28

## 2012-02-24 NOTE — Patient Instructions (Addendum)
Your physician recommends that you continue on your current medications as directed. Please refer to the Current Medication list given to you today.  Your physician recommends that you schedule a follow-up appointment in: 3 month of EKG/bmet/magnesium  Your physician has requested that you have an echocardiogram. Echocardiography is a painless test that uses sound waves to create images of your heart. It provides your doctor with information about the size and shape of your heart and how well your heart's chambers and valves are working. This procedure takes approximately one hour. There are no restrictions for this procedure.  (BEFORE 3 MONTH OV)

## 2012-02-24 NOTE — Assessment & Plan Note (Signed)
His most recent echocardiogram had shown a drop in ejection fraction felt possibly secondary to atrial fibrillation with inadequate rate control.  Our hope was that now that he is back in normal sinus rhythm his ejection fraction will improve again.  We'll plan to recheck an echo prior to his next visit in 3 months.  He is not having any symptoms to suggest congestive heart failure

## 2012-02-26 ENCOUNTER — Encounter: Payer: Self-pay | Admitting: Cardiology

## 2012-03-10 ENCOUNTER — Ambulatory Visit: Payer: Medicare Other | Admitting: Cardiology

## 2012-04-13 ENCOUNTER — Other Ambulatory Visit: Payer: Self-pay | Admitting: *Deleted

## 2012-04-13 MED ORDER — RIVAROXABAN 20 MG PO TABS: 20.0000 mg | ORAL_TABLET | Freq: Every day | ORAL | Status: DC

## 2012-04-15 ENCOUNTER — Other Ambulatory Visit: Payer: Self-pay

## 2012-04-15 MED ORDER — RIVAROXABAN 20 MG PO TABS: 20.0000 mg | ORAL_TABLET | Freq: Every day | ORAL | Status: DC

## 2012-05-13 ENCOUNTER — Telehealth: Payer: Self-pay | Admitting: Cardiology

## 2012-05-13 NOTE — Telephone Encounter (Signed)
[phone] insurance id number [id number]

## 2012-05-13 NOTE — Telephone Encounter (Signed)
New problem:   [employer] as of [employer] . Need an authorization on file .  Xarelto.

## 2012-05-14 NOTE — Telephone Encounter (Signed)
Good until May 14, 2013 Advised patient

## 2012-05-20 ENCOUNTER — Ambulatory Visit (HOSPITAL_COMMUNITY): Payer: Medicare Other | Attending: Cardiovascular Disease | Admitting: Radiology

## 2012-05-20 DIAGNOSIS — I10 Essential (primary) hypertension: Secondary | ICD-10-CM | POA: Insufficient documentation

## 2012-05-20 DIAGNOSIS — I4892 Unspecified atrial flutter: Secondary | ICD-10-CM

## 2012-05-20 DIAGNOSIS — I369 Nonrheumatic tricuspid valve disorder, unspecified: Secondary | ICD-10-CM | POA: Insufficient documentation

## 2012-05-20 DIAGNOSIS — I480 Paroxysmal atrial fibrillation: Secondary | ICD-10-CM

## 2012-05-20 DIAGNOSIS — I4891 Unspecified atrial fibrillation: Secondary | ICD-10-CM

## 2012-05-20 DIAGNOSIS — I519 Heart disease, unspecified: Secondary | ICD-10-CM | POA: Insufficient documentation

## 2012-05-20 DIAGNOSIS — I080 Rheumatic disorders of both mitral and aortic valves: Secondary | ICD-10-CM | POA: Insufficient documentation

## 2012-05-20 NOTE — Progress Notes (Signed)
Echocardiogram performed.  

## 2012-05-28 ENCOUNTER — Ambulatory Visit (INDEPENDENT_AMBULATORY_CARE_PROVIDER_SITE_OTHER): Payer: Medicare Other | Admitting: Cardiology

## 2012-05-28 ENCOUNTER — Encounter: Payer: Self-pay | Admitting: Cardiology

## 2012-05-28 VITALS — BP 133/64 | HR 71 | Resp 18 | Ht 72.0 in | Wt 217.4 lb

## 2012-05-28 DIAGNOSIS — I10 Essential (primary) hypertension: Secondary | ICD-10-CM

## 2012-05-28 DIAGNOSIS — I519 Heart disease, unspecified: Secondary | ICD-10-CM

## 2012-05-28 DIAGNOSIS — I119 Hypertensive heart disease without heart failure: Secondary | ICD-10-CM

## 2012-05-28 DIAGNOSIS — I4891 Unspecified atrial fibrillation: Secondary | ICD-10-CM

## 2012-05-28 DIAGNOSIS — I480 Paroxysmal atrial fibrillation: Secondary | ICD-10-CM | POA: Insufficient documentation

## 2012-05-28 LAB — BASIC METABOLIC PANEL
BUN: 17 mg/dL (ref 6–23)
CO2: 29 mEq/L (ref 19–32)
Calcium: 8.8 mg/dL (ref 8.4–10.5)
Creatinine, Ser: 0.8 mg/dL (ref 0.4–1.5)
Glucose, Bld: 98 mg/dL (ref 70–99)

## 2012-05-28 NOTE — Patient Instructions (Addendum)
Your physician recommends that you continue on your current medications as directed. Please refer to the Current Medication list given to you today.  Your physician recommends that you schedule a follow-up appointment in: 3 MONTH OV/EKG/BMET/MAGNESIUM   Will obtain labs today and call you with the results (BMET/MAGNESIUM )

## 2012-05-28 NOTE — Progress Notes (Signed)
Connor [name] Date of Birth:  December 24, 1933 [facility] [address] [place], [place]  [address] [phone]         Fax   [phone]  History of Present Illness: This pleasant 79 year old gentleman is seen for a scheduled followup office visit. He has a history of recurrent atrial fibrillation. He has had previous ablation procedure by Dr. Johney Frame for atrial flutter. He is on long-term Xarelto for chronic anticoagulation. He has a history of high blood pressure and hypercholesterolemia. He has had a previous deep vein thrombosis in the setting of right hip surgery. He went back into atrial fibrillation in mid July 2013. He has less energy and has been more short of breath whenever he is not in sinus rhythm. Since his last visit with me he was hospitalized for Tikosyn loading. He converted after initial doses and has been on Tikosyn. He had only one recent episode of recurrent atrial fibrillation which occurred on and [place]. this occurred on September 27 when he was riding a horse which was out of control and he became very frightened. He remained in atrial fibrillation for a day and a half and then went back into sinus rhythm.  In December he had an episode of atrial fibrillation after pulling a wheelbarrow full of heavy logs up a hill.  It lasted for a few hours and then resolved.  Last week he had another brief episode which resolved spontaneously after taking an extra metoprolol tartrate.   Current Outpatient Prescriptions  Medication Sig Dispense Refill  . atorvastatin (LIPITOR) 10 MG tablet Take 10 mg by mouth daily.        . Calcium Carbonate (CALCIUM 500 PO) Take 500 mg by mouth 2 (two) times daily.       . Cholecalciferol (VITAMIN D3) 5000 UNITS TABS Take 5,000 Units by mouth daily.        [name] Calcium (STOOL SOFTENER PO) Take 1 capsule by mouth daily.       Marland Kitchen dofetilide (TIKOSYN) 500 MCG capsule Take 1 capsule (500  mcg total) by mouth every 12 (twelve) hours.  60 capsule  11  . lisinopril (PRINIVIL,ZESTRIL) 10 MG tablet Take 10 mg by mouth daily.      . methocarbamol (ROBAXIN) 500 MG tablet Take 500 mg by mouth 3 (three) times daily as needed. For pain      . metoprolol succinate (TOPROL-XL) 50 MG 24 hr tablet Take 25-50 mg by mouth daily. Take 1 tablet in the morning and 1/2 in the evening      . metoprolol tartrate (LOPRESSOR) 25 MG tablet Take 1 tablet every 6 hours as needed for fast heart rates  30 tablet  2  . Multiple Vitamin (MULITIVITAMIN) LIQD Take 5 mLs by mouth daily.      . Multiple Vitamins-Minerals (ICAPS PO) Take 1 tablet by mouth daily.       . Omega-3 Fatty Acids (OMEGA 3 PO) Take 1 capsule by mouth daily.       Marland Kitchen omeprazole (PRILOSEC) 20 MG capsule Take 20 mg by mouth daily.        . potassium chloride SA (K-DUR,KLOR-CON) 20 MEQ tablet Take 1 tablet (20 mEq total) by mouth 2 (two) times daily.  60 tablet  11  . Rivaroxaban (XARELTO) 20 MG TABS Take 1 tablet (20 mg total) by mouth daily.  30 tablet  3  . Tamsulosin HCl (FLOMAX) 0.4 MG CAPS Take  0.4 mg by mouth daily.        . traMADol (ULTRAM) 50 MG tablet Take 50 mg by mouth every 6 (six) hours as needed. For pain        Allergies  Allergen Reactions  . Penicillins Itching and Swelling    Patient Active Problem List  Diagnosis  . Atrial flutter  . Hyperlipidemia  . Hypertension  . Osteoarthritis  . BPH (benign prostatic hyperplasia)  . GERD (gastroesophageal reflux disease)  . Persistent atrial fibrillation  . PVC (premature ventricular contraction)  . Fatigue  . Low back pain  . Left ventricular dysfunction    History  Smoking status  . Former Smoker  . Types: Cigarettes  . Quit date: 05/06/1978  Smokeless tobacco  . Never Used    History  Alcohol Use  . 3.0 oz/week  . 5 Cans of beer per week    Family History  Problem Relation Age of Onset  . Heart attack Father   . Lung disease Mother   . Atrial  fibrillation Brother   . Arrhythmia Sister     Review of Systems: Constitutional: no fever chills diaphoresis or fatigue or change in weight.  Head and neck: no hearing loss, no epistaxis, no photophobia or visual disturbance. Respiratory: No cough, shortness of breath or wheezing. Cardiovascular: No chest pain peripheral edema, palpitations. Gastrointestinal: No abdominal distention, no abdominal pain, no change in bowel habits hematochezia or melena. Genitourinary: No dysuria, no frequency, no urgency, no nocturia. Musculoskeletal:No arthralgias, no back pain, no gait disturbance or myalgias. Neurological: No dizziness, no headaches, no numbness, no seizures, no syncope, no weakness, no tremors. Hematologic: No lymphadenopathy, no easy bruising. Psychiatric: No confusion, no hallucinations, no sleep disturbance.    Physical Exam: Filed Vitals:   05/28/12 1003  BP: 133/64  Pulse: 71  Resp: 18   the general appearance reveals a well-developed well-nourished gentleman in no distress.The head and neck exam reveals pupils equal and reactive.  Extraocular movements are full.  There is no scleral icterus.  The mouth and pharynx are normal.  The neck is supple.  The carotids reveal no bruits.  The jugular venous pressure is normal.  The  thyroid is not enlarged.  There is no lymphadenopathy.  The chest is clear to percussion and auscultation.  There are no rales or rhonchi.  Expansion of the chest is symmetrical.  The precordium is quiet.  The first heart sound is normal.  The second heart sound is physiologically split.  There is no murmur gallop rub or click.  There is no abnormal lift or heave.  The abdomen is soft and nontender.  The bowel sounds are normal.  The liver and spleen are not enlarged.  There are no abdominal masses.  There are no abdominal bruits.  Extremities reveal good pedal pulses.  There is no phlebitis or edema.  There is no cyanosis or clubbing.  Strength is normal and  symmetrical in all extremities.  There is no lateralizing weakness.  There are no sensory deficits.  The skin is warm and dry.  There is no rash.  EKG today shows normal sinus rhythm with occasional PVC and a right bundle branch block and his QTC is 529 ms which is satisfactory in view of his right bundle branch block   Assessment / Plan: Continue same medication and be rechecked in 4 months for followup office visit EKG basal metabolic panel and magnesium level

## 2012-05-28 NOTE — Assessment & Plan Note (Signed)
The patient had a recent echocardiogram done approximately 3 months after restoration of normal sinus rhythm to see whether his ejection fraction would have improved.  His echocardiogram done on 05/20/12 showed a mildly dilated left ventricular cavity with an ejection fraction of approximately 40% as gauged by his fractional shortening of 19%.  The ejection fraction was not actually noted on the present report.

## 2012-05-28 NOTE — Assessment & Plan Note (Signed)
The patient has infrequent episodes of paroxysmal atrial fibrillation.  When these do occur he takes a metoprolol tartrate and within 4 hours he will usually convert back to sinus rhythm.  He remains on Tikosyn with which he has done very well.

## 2012-05-28 NOTE — Assessment & Plan Note (Signed)
Blood pressure has been remaining stable on current regimen.  No dizziness or syncope

## 2012-05-28 NOTE — Progress Notes (Signed)
Quick Note:  Please report to patient. The recent labs are stable. Continue same medication and careful diet. ______ 

## 2012-05-29 ENCOUNTER — Telehealth: Payer: Self-pay | Admitting: *Deleted

## 2012-05-29 NOTE — Telephone Encounter (Signed)
Advised patient of lab results  

## 2012-05-29 NOTE — Telephone Encounter (Signed)
Message copied by Burnell Blanks on Fri May 29, 2012  6:03 PM ------      Message from: Cassell Clement      Created: Thu May 28, 2012  8:56 PM       Please report to patient.  The recent labs are stable. Continue same medication and careful diet.

## 2012-06-04 ENCOUNTER — Other Ambulatory Visit: Payer: Self-pay

## 2012-06-04 MED ORDER — LISINOPRIL 10 MG PO TABS: 10.0000 mg | ORAL_TABLET | Freq: Every day | ORAL | Status: DC

## 2012-06-20 ENCOUNTER — Other Ambulatory Visit: Payer: Self-pay

## 2012-06-29 ENCOUNTER — Telehealth: Payer: Self-pay | Admitting: Cardiology

## 2012-06-29 DIAGNOSIS — R001 Bradycardia, unspecified: Secondary | ICD-10-CM

## 2012-06-29 NOTE — Telephone Encounter (Signed)
Advised patient and scheduled monitor  

## 2012-06-29 NOTE — Telephone Encounter (Signed)
blood pressure 132/50 heart rate is 47. Heart rate has been low on and off, usually has been around 65. Is feeling fatigue and no energy. States he feels like his heart is in NSR. Will forward to Dawayne Patricia NP for review

## 2012-06-29 NOTE — Telephone Encounter (Signed)
New Problem    Heart rate has been fluctuating lately and pt states he just hasnt been feeling well. Would like to speak to nurse.

## 2012-06-29 NOTE — Telephone Encounter (Signed)
Do you think he needs a Holter?

## 2012-06-30 ENCOUNTER — Encounter (INDEPENDENT_AMBULATORY_CARE_PROVIDER_SITE_OTHER): Payer: Medicare Other

## 2012-06-30 ENCOUNTER — Encounter: Payer: Self-pay | Admitting: Cardiology

## 2012-06-30 ENCOUNTER — Telehealth: Payer: Self-pay | Admitting: *Deleted

## 2012-06-30 DIAGNOSIS — I498 Other specified cardiac arrhythmias: Secondary | ICD-10-CM

## 2012-06-30 NOTE — Telephone Encounter (Signed)
24 hr holter monitor placed on Pt 06/30/12 TK

## 2012-07-21 NOTE — Telephone Encounter (Signed)
Advised patient of holter monitor, per  Dr. Patty Sermons NSR with occasional PAC's and PVC's. No VT, atrial flutter/fib

## 2012-08-25 ENCOUNTER — Encounter: Payer: Self-pay | Admitting: Cardiology

## 2012-08-25 ENCOUNTER — Ambulatory Visit (INDEPENDENT_AMBULATORY_CARE_PROVIDER_SITE_OTHER): Payer: Medicare Other | Admitting: Cardiology

## 2012-08-25 ENCOUNTER — Other Ambulatory Visit (INDEPENDENT_AMBULATORY_CARE_PROVIDER_SITE_OTHER): Payer: Medicare Other

## 2012-08-25 VITALS — BP 118/74 | HR 73 | Ht 72.0 in | Wt 215.1 lb

## 2012-08-25 DIAGNOSIS — M545 Low back pain, unspecified: Secondary | ICD-10-CM

## 2012-08-25 DIAGNOSIS — I480 Paroxysmal atrial fibrillation: Secondary | ICD-10-CM

## 2012-08-25 DIAGNOSIS — I519 Heart disease, unspecified: Secondary | ICD-10-CM

## 2012-08-25 DIAGNOSIS — I10 Essential (primary) hypertension: Secondary | ICD-10-CM

## 2012-08-25 DIAGNOSIS — I119 Hypertensive heart disease without heart failure: Secondary | ICD-10-CM

## 2012-08-25 DIAGNOSIS — I4891 Unspecified atrial fibrillation: Secondary | ICD-10-CM

## 2012-08-25 LAB — BASIC METABOLIC PANEL
CO2: 30 mEq/L (ref 19–32)
Chloride: 101 mEq/L (ref 96–112)
Creatinine, Ser: 0.9 mg/dL (ref 0.4–1.5)

## 2012-08-25 NOTE — Patient Instructions (Addendum)
Will obtain labs today and call you with the results (bmet/magnesium)  Your physician recommends that you continue on your current medications as directed. Please refer to the Current Medication list given to you today.  Your physician recommends that you schedule a follow-up appointment in: 3 month ov/bmet/magnesium

## 2012-08-25 NOTE — Assessment & Plan Note (Signed)
The patient is maintaining normal sinus rhythm today.  He estimates that every 2 or 3 weeks he has a brief episode of atrial fibrillation which lasts 10-24 hours and then resolves.  He does not get too upset when it happens now because he knows that they will go back into rhythm sooner rather than later.  He continues to take his normal medications and also uses supplemental metoprolol tartrate when he is out of rhythm.  His exercise tolerance is good.  He works out at [employer] several days a week.  We talked about the fact that it would be fine for him to get his heart rate up to about 120 but that should be maximum for him.

## 2012-08-25 NOTE — Assessment & Plan Note (Signed)
The patient is not having symptoms of CHF.

## 2012-08-25 NOTE — Assessment & Plan Note (Signed)
Blood pressures remaining stable on current therapy. 

## 2012-08-25 NOTE — Progress Notes (Signed)
[name] Date of Birth:  [birth year] [facility] [address] [place], [place]  [address] [phone]         Fax   [phone]  History of Present Illness: This pleasant 79 year old gentleman is seen for a scheduled followup office visit. He has a history of recurrent atrial fibrillation. He has had previous ablation procedure by Dr. Johney Frame for atrial flutter. He is on long-term Xarelto for chronic anticoagulation. He has a history of high blood pressure and hypercholesterolemia. He has had a previous deep vein thrombosis in the setting of right hip surgery. He went back into atrial fibrillation in mid July 2013. He has less energy and has been more short of breath whenever he is not in sinus rhythm.  More recently he was hospitalized for Tikosyn loading. He converted after initial doses and has been on Tikosyn.  He has been tolerating the Tikosyn well.  Current Outpatient Prescriptions  Medication Sig Dispense Refill  . atorvastatin (LIPITOR) 10 MG tablet Take 10 mg by mouth daily.        . Calcium Carbonate (CALCIUM 500 PO) Take 500 mg by mouth 2 (two) times daily.       . Cholecalciferol (VITAMIN D3) 5000 UNITS TABS Take 5,000 Units by mouth daily.        [name] Calcium (STOOL SOFTENER PO) Take 1 capsule by mouth daily.       Marland Kitchen dofetilide (TIKOSYN) 500 MCG capsule Take 1 capsule (500 mcg total) by mouth every 12 (twelve) hours.  60 capsule  11  . lisinopril (PRINIVIL,ZESTRIL) 10 MG tablet Take 1 tablet (10 mg total) by mouth daily.  30 tablet  6  . metoprolol succinate (TOPROL-XL) 50 MG 24 hr tablet Take 25-50 mg by mouth daily. Take 1 tablet in the morning and 1/2 in the evening      . metoprolol tartrate (LOPRESSOR) 25 MG tablet Take 1 tablet every 6 hours as needed for fast heart rates  30 tablet  2  . Multiple Vitamin (MULITIVITAMIN) LIQD Take 5 mLs by mouth daily.      . Multiple Vitamins-Minerals (ICAPS PO) Take 1 tablet by mouth daily.       .  Omega-3 Fatty Acids (OMEGA 3 PO) Take 1 capsule by mouth daily.       Marland Kitchen omeprazole (PRILOSEC) 20 MG capsule Take 20 mg by mouth daily.        . potassium chloride SA (K-DUR,KLOR-CON) 20 MEQ tablet Take 1 tablet (20 mEq total) by mouth 2 (two) times daily.  60 tablet  11  . Rivaroxaban (XARELTO) 20 MG TABS Take 1 tablet (20 mg total) by mouth daily.  30 tablet  3  . Tamsulosin HCl (FLOMAX) 0.4 MG CAPS Take 0.4 mg by mouth daily.        . traMADol (ULTRAM) 50 MG tablet Take 50 mg by mouth every 6 (six) hours as needed. For pain      . methocarbamol (ROBAXIN) 500 MG tablet Take 500 mg by mouth 3 (three) times daily as needed. For pain       No current facility-administered medications for this visit.    Allergies  Allergen Reactions  . Penicillins Itching and Swelling    Patient Active Problem List  Diagnosis  . Atrial flutter  . Hyperlipidemia  . Hypertension  . Osteoarthritis  . BPH (benign prostatic hyperplasia)  . GERD (gastroesophageal reflux disease)  . Persistent atrial fibrillation  . PVC (premature ventricular contraction)  .  Fatigue  . Low back pain  . Left ventricular dysfunction  . PAF (paroxysmal atrial fibrillation)    History  Smoking status  . Former Smoker  . Types: Cigarettes  . Quit date: 05/06/1978  Smokeless tobacco  . Never Used    History  Alcohol Use  . 3.0 oz/week  . 5 Cans of beer per week    Family History  Problem Relation Age of Onset  . Heart attack Father   . Lung disease Mother   . Atrial fibrillation Brother   . Arrhythmia Sister     Review of Systems: Constitutional: no fever chills diaphoresis or fatigue or change in weight.  Head and neck: no hearing loss, no epistaxis, no photophobia or visual disturbance. Respiratory: No cough, shortness of breath or wheezing. Cardiovascular: No chest pain peripheral edema, palpitations. Gastrointestinal: No abdominal distention, no abdominal pain, no change in bowel habits hematochezia or  melena. Genitourinary: No dysuria, no frequency, no urgency, no nocturia. Musculoskeletal:No arthralgias, no back pain, no gait disturbance or myalgias. Neurological: No dizziness, no headaches, no numbness, no seizures, no syncope, no weakness, no tremors. Hematologic: No lymphadenopathy, no easy bruising. Psychiatric: No confusion, no hallucinations, no sleep disturbance.    Physical Exam: Filed Vitals:   08/25/12 1029  BP: 118/74  Pulse: 73   the general appearance reveals a well-developed elderly gentleman in no distress.The head and neck exam reveals pupils equal and reactive.  Extraocular movements are full.  There is no scleral icterus.  The mouth and pharynx are normal.  The neck is supple.  The carotids reveal no bruits.  The jugular venous pressure is normal.  The  thyroid is not enlarged.  There is no lymphadenopathy.  The chest is clear to percussion and auscultation.  There are no rales or rhonchi.  Expansion of the chest is symmetrical.  The precordium is quiet.  The first heart sound is normal.  The second heart sound is physiologically split.  There is no murmur gallop rub or click.  There is no abnormal lift or heave.  The abdomen is soft and nontender.  The bowel sounds are normal.  The liver and spleen are not enlarged.  There are no abdominal masses.  There are no abdominal bruits.  Extremities reveal good pedal pulses.  There is no phlebitis or edema.  There is no cyanosis or clubbing.  Strength is normal and symmetrical in all extremities.  There is no lateralizing weakness.  There are no sensory deficits.  The skin is warm and dry.  There is no rash.  EKG today shows normal sinus rhythm with right bundle branch block and his QTC is 524.  Assessment / Plan: Continue same medication.  Blood work today pending for [employer].  Recheck in 3 months for office visit EKG basal metabolic panel and magnesium level.

## 2012-08-25 NOTE — Progress Notes (Signed)
Quick Note:  Please report to patient. The recent labs are stable. Continue same medication and careful diet. ______ 

## 2012-08-25 NOTE — Assessment & Plan Note (Addendum)
He had been taking both tramadol and Robaxin given to him by Dr. Lucianne Muss for his low back spasms and pain.  However he found that that combination caused him to be too tired and so he has stopped taking the Robaxin.

## 2012-08-27 ENCOUNTER — Telehealth: Payer: Self-pay | Admitting: *Deleted

## 2012-08-27 NOTE — Telephone Encounter (Signed)
Message copied by Burnell Blanks on Thu Aug 27, 2012 12:27 PM ------      Message from: Cassell Clement      Created: Tue Aug 25, 2012  9:53 PM       Please report to patient.  The recent labs are stable. Continue same medication and careful diet. ------

## 2012-08-27 NOTE — Telephone Encounter (Signed)
Advised wife of labs 

## 2012-09-24 ENCOUNTER — Other Ambulatory Visit: Payer: Self-pay | Admitting: Dermatology

## 2012-10-27 ENCOUNTER — Inpatient Hospital Stay (HOSPITAL_BASED_OUTPATIENT_CLINIC_OR_DEPARTMENT_OTHER)
Admission: EM | Admit: 2012-10-27 | Discharge: 2012-10-28 | DRG: 310 | Disposition: A | Discharge status: Home / Self Care | Payer: Medicare Other | Attending: Cardiology | Admitting: Cardiology

## 2012-10-27 ENCOUNTER — Encounter (HOSPITAL_BASED_OUTPATIENT_CLINIC_OR_DEPARTMENT_OTHER): Payer: Self-pay | Admitting: *Deleted

## 2012-10-27 ENCOUNTER — Emergency Department (HOSPITAL_BASED_OUTPATIENT_CLINIC_OR_DEPARTMENT_OTHER): Payer: Medicare Other

## 2012-10-27 DIAGNOSIS — M1990 Unspecified osteoarthritis, unspecified site: Secondary | ICD-10-CM | POA: Diagnosis present

## 2012-10-27 DIAGNOSIS — K219 Gastro-esophageal reflux disease without esophagitis: Secondary | ICD-10-CM | POA: Diagnosis present

## 2012-10-27 DIAGNOSIS — E785 Hyperlipidemia, unspecified: Secondary | ICD-10-CM | POA: Diagnosis present

## 2012-10-27 DIAGNOSIS — W010XXA Fall on same level from slipping, tripping and stumbling without subsequent striking against object, initial encounter: Secondary | ICD-10-CM | POA: Diagnosis present

## 2012-10-27 DIAGNOSIS — Z96659 Presence of unspecified artificial knee joint: Secondary | ICD-10-CM

## 2012-10-27 DIAGNOSIS — Z86718 Personal history of other venous thrombosis and embolism: Secondary | ICD-10-CM

## 2012-10-27 DIAGNOSIS — S20219A Contusion of unspecified front wall of thorax, initial encounter: Secondary | ICD-10-CM | POA: Diagnosis present

## 2012-10-27 DIAGNOSIS — N400 Enlarged prostate without lower urinary tract symptoms: Secondary | ICD-10-CM | POA: Diagnosis present

## 2012-10-27 DIAGNOSIS — I4510 Unspecified right bundle-branch block: Secondary | ICD-10-CM | POA: Diagnosis present

## 2012-10-27 DIAGNOSIS — Z87891 Personal history of nicotine dependence: Secondary | ICD-10-CM

## 2012-10-27 DIAGNOSIS — I4891 Unspecified atrial fibrillation: Secondary | ICD-10-CM

## 2012-10-27 DIAGNOSIS — D649 Anemia, unspecified: Secondary | ICD-10-CM | POA: Diagnosis present

## 2012-10-27 DIAGNOSIS — E559 Vitamin D deficiency, unspecified: Secondary | ICD-10-CM | POA: Diagnosis present

## 2012-10-27 DIAGNOSIS — I10 Essential (primary) hypertension: Secondary | ICD-10-CM

## 2012-10-27 DIAGNOSIS — K279 Peptic ulcer, site unspecified, unspecified as acute or chronic, without hemorrhage or perforation: Secondary | ICD-10-CM | POA: Diagnosis present

## 2012-10-27 DIAGNOSIS — I4892 Unspecified atrial flutter: Secondary | ICD-10-CM | POA: Diagnosis present

## 2012-10-27 DIAGNOSIS — S20212A Contusion of left front wall of thorax, initial encounter: Secondary | ICD-10-CM

## 2012-10-27 DIAGNOSIS — Z7901 Long term (current) use of anticoagulants: Secondary | ICD-10-CM

## 2012-10-27 DIAGNOSIS — Z96649 Presence of unspecified artificial hip joint: Secondary | ICD-10-CM

## 2012-10-27 LAB — CBC WITH DIFFERENTIAL/PLATELET
Eosinophils Relative: 3 % (ref 0–5)
HCT: 42.7 % (ref 39.0–52.0)
Lymphocytes Relative: 19 % (ref 12–46)
Lymphs Abs: 1.5 10*3/uL (ref 0.7–4.0)
MCV: 100.7 fL — ABNORMAL HIGH (ref 78.0–100.0)
Monocytes Absolute: 1 10*3/uL (ref 0.1–1.0)
Platelets: 126 10*3/uL — ABNORMAL LOW (ref 150–400)
RBC: 4.24 MIL/uL (ref 4.22–5.81)
WBC: 7.7 10*3/uL (ref 4.0–10.5)

## 2012-10-27 LAB — URINE MICROSCOPIC-ADD ON

## 2012-10-27 LAB — COMPREHENSIVE METABOLIC PANEL
ALT: 23 U/L (ref 0–53)
CO2: 25 mEq/L (ref 19–32)
Calcium: 9.5 mg/dL (ref 8.4–10.5)
Chloride: 104 mEq/L (ref 96–112)
GFR calc Af Amer: >90 mL/min (ref >90)
GFR calc non Af Amer: 85 mL/min — ABNORMAL LOW (ref >90)
Glucose, Bld: 127 mg/dL — ABNORMAL HIGH (ref 70–99)
Sodium: 139 mEq/L (ref 135–145)
Total Bilirubin: 0.5 mg/dL (ref 0.3–1.2)

## 2012-10-27 LAB — PROTIME-INR: INR: 1.48 (ref 0.00–1.49)

## 2012-10-27 LAB — URINALYSIS, ROUTINE W REFLEX MICROSCOPIC
Bilirubin Urine: NEGATIVE
Glucose, UA: NEGATIVE mg/dL
Hgb urine dipstick: NEGATIVE
Specific Gravity, Urine: 1.007 (ref 1.005–1.030)
Urobilinogen, UA: 0.2 mg/dL (ref 0.0–1.0)
pH: 5.5 (ref 5.0–8.0)

## 2012-10-27 MED ORDER — SODIUM CHLORIDE 0.9 % IV BOLUS (SEPSIS)
500.0000 mL | Freq: Once | INTRAVENOUS | Status: AC
  Administered 2012-10-27: 500 mL via INTRAVENOUS

## 2012-10-27 MED ORDER — METOPROLOL TARTRATE 1 MG/ML IV SOLN
5.0000 mg | Freq: Once | INTRAVENOUS | Status: DC
  Filled 2012-10-27: qty 5

## 2012-10-27 MED ORDER — METOPROLOL TARTRATE 1 MG/ML IV SOLN
5.0000 mg | Freq: Once | INTRAVENOUS | Status: AC
  Administered 2012-10-27: 5 mg via INTRAVENOUS
  Filled 2012-10-27: qty 5

## 2012-10-27 MED ORDER — SODIUM CHLORIDE 0.9 % IV SOLN
250.0000 mL | INTRAVENOUS | Status: DC | PRN
  Administered 2012-10-27: 250 mL via INTRAVENOUS

## 2012-10-27 NOTE — ED Notes (Signed)
Pt sts he was in the process of standing up and tripped and fell landing on his right side. Pt is c/o right side rib pain and sts that the fall caused him to go into a-fib. Pt took 1 quick acting metoprolol 25mg  when he realized he was in a-fib.

## 2012-10-27 NOTE — ED Notes (Signed)
Report called ro Reden,RN on 2000 at [facility]

## 2012-10-27 NOTE — ED Notes (Signed)
MD notified metoprolol held at this time d/t blood pressure.  Will recheck following fluid bolus.

## 2012-10-27 NOTE — ED Notes (Signed)
Patient transported to X-ray via stretcher on monitor with tech.

## 2012-10-27 NOTE — H&P (Signed)
Connor [name] is an 79 y.o. male.   Chief Complaint: fall, found to have atrial fibrillation + RVR HPI: [name] is a very pleasant 79 yo man with PMH of atrial flutter s/p prior ablation by dr. Johney Frame on chronic anticoagulation with xarelto followed by Dr. Patty Sermons who was last seen 4/14 doing fairly well who fell and tripped in his backyard and hit his side/ribs. In the ER at [place] his x-rays were unrevealing but his HR was 130s-150s in atrial fibrillation with BP as low as 89 sbp after receiving metoprolol without resolution/improved rate control leading to transfer to [place] for further management/evaluation. He is on rate-control therapy with tikosyn. Previously, his atrial fibrillation comes on around every 2-3 weeks and lasts for 1/2 - 1 day before resolution. He tells me his atrial fibrillation has been doing well - last time in afib approximately 1 month ago. He actually is pretty asymptomatic and he typically takes a metoprolol or two extra (short-acting) and lies down and it goes away. Otherwise, no infectious symptoms, no chest pain, no fever/chills, no nausea/vomiting/diarrhea. Occasional heat spells in the early morning but no change in diet. No weight loss or weight gain. No polyuria. No change in vision. No change in exercise tolerance.   Past Medical History  Diagnosis Date  . Hypertension   . Atrial flutter     s/p ablation in March 2012  . Hyperlipidemia   . Vitamin D deficiency   . RBBB (right bundle branch block)   . BPH (benign prostatic hyperplasia)   . Osteoarthritis   . Anemia   . Peptic ulcer disease   . DVT (deep venous thrombosis) 2/12    in setting of R hip surgery, he developed R femoral DVT  . Chronic anticoagulation     on Xarelto fo afib  . Abnormal nuclear cardiac imaging test 1/13    Moderate size inferior and apical defect with partial reversibility in the inferior wall suggestive of prior inferior and apical infarct vs thinning; mild inferior  ischemia. Not gated due to ectopy.   . Paroxysmal atrial fibrillation 9/12    documented by Dr Otho Perl on office EKG 9/12  . Premature ventricular contraction     Past Surgical History  Procedure Laterality Date  . Total hip arthroplasty  06/04/10    RIGHT HIP  . Total knee arthroplasty    . Transurethral resection of prostate    . Atrial flutter ablation  5/12    by JA  . Cardiac catheterization      Family History  Problem Relation Age of Onset  . Heart attack Father   . Lung disease Mother   . Atrial fibrillation Brother   . Arrhythmia Sister    Social History:  reports that he quit smoking about 34 years ago. His smoking use included Cigarettes. He smoked 0.00 packs per day. He has never used smokeless tobacco. He reports that he drinks about 3.0 ounces of alcohol per week. He reports that he does not use illicit drugs.  Allergies:  Allergies  Allergen Reactions  . Penicillins Itching and Swelling     (Not in a hospital admission)  Results for orders placed during the hospital encounter of 10/27/12 (from the past 48 hour(s))  CBC WITH DIFFERENTIAL     Status: Abnormal   Collection Time    10/27/12  9:11 PM      Result Value Range   WBC 7.7  4.0 - 10.5 K/uL   RBC 4.24  4.22 - 5.81 MIL/uL   Hemoglobin 14.5  13.0 - 17.0 g/dL   HCT 40.9  81.1 - 91.4 %   MCV 100.7 (*) 78.0 - 100.0 fL   MCH 34.2 (*) 26.0 - 34.0 pg   MCHC 34.0  30.0 - 36.0 g/dL   RDW 78.2  95.6 - 21.3 %   Platelets 126 (*) 150 - 400 K/uL   Neutrophils Relative % 65  43 - 77 %   Neutro Abs 5.0  1.7 - 7.7 K/uL   Lymphocytes Relative 19  12 - 46 %   Lymphs Abs 1.5  0.7 - 4.0 K/uL   Monocytes Relative 13 (*) 3 - 12 %   Monocytes Absolute 1.0  0.1 - 1.0 K/uL   Eosinophils Relative 3  0 - 5 %   Eosinophils Absolute 0.2  0.0 - 0.7 K/uL   Basophils Relative 0  0 - 1 %   Basophils Absolute 0.0  0.0 - 0.1 K/uL  COMPREHENSIVE METABOLIC PANEL     Status: Abnormal   Collection Time    10/27/12  9:11 PM       Result Value Range   Sodium 139  135 - 145 mEq/L   Potassium 3.8  3.5 - 5.1 mEq/L   Chloride 104  96 - 112 mEq/L   CO2 25  19 - 32 mEq/L   Glucose, Bld 127 (*) 70 - 99 mg/dL   BUN 15  6 - 23 mg/dL   Creatinine, Ser 0.86  0.50 - 1.35 mg/dL   Calcium 9.5  8.4 - 57.8 mg/dL   Total Protein 6.8  6.0 - 8.3 g/dL   Albumin 3.8  3.5 - 5.2 g/dL   AST 27  0 - 37 U/L   ALT 23  0 - 53 U/L   Alkaline Phosphatase 67  39 - 117 U/L   Total Bilirubin 0.5  0.3 - 1.2 mg/dL   GFR calc non Af Amer 85 (*) >90 mL/min   GFR calc Af Amer >90  >90 mL/min   Comment:            The eGFR has been calculated     using the CKD EPI equation.     This calculation has not been     validated in all clinical     situations.     eGFR's persistently     <90 mL/min signify     possible Chronic Kidney Disease.  TROPONIN I     Status: None   Collection Time    10/27/12  9:11 PM      Result Value Range   Troponin I <0.30  <0.30 ng/mL   Comment:            Due to the release kinetics of cTnI,     a negative result within the first hours     of the onset of symptoms does not rule out     myocardial infarction with certainty.     If myocardial infarction is still suspected,     repeat the test at appropriate intervals.  PROTIME-INR     Status: Abnormal   Collection Time    10/27/12  9:11 PM      Result Value Range   Prothrombin Time 17.5 (*) 11.6 - 15.2 seconds   INR 1.48  0.00 - 1.49  APTT     Status: Abnormal   Collection Time    10/27/12  9:11 PM      Result  Value Range   aPTT 52 (*) 24 - 37 seconds   Comment:            IF BASELINE aPTT IS ELEVATED,     SUGGEST PATIENT RISK ASSESSMENT     BE USED TO DETERMINE APPROPRIATE     ANTICOAGULANT THERAPY.  URINALYSIS, ROUTINE W REFLEX MICROSCOPIC     Status: Abnormal   Collection Time    10/27/12  9:29 PM      Result Value Range   Color, Urine YELLOW  YELLOW   APPearance CLEAR  CLEAR   Specific Gravity, Urine 1.007  1.005 - 1.030   pH 5.5  5.0 - 8.0    Glucose, UA NEGATIVE  NEGATIVE mg/dL   Hgb urine dipstick NEGATIVE  NEGATIVE   Bilirubin Urine NEGATIVE  NEGATIVE   Ketones, ur NEGATIVE  NEGATIVE mg/dL   Protein, ur NEGATIVE  NEGATIVE mg/dL   Urobilinogen, UA 0.2  0.0 - 1.0 mg/dL   Nitrite NEGATIVE  NEGATIVE   Leukocytes, UA TRACE (*) NEGATIVE  URINE MICROSCOPIC-ADD ON     Status: None   Collection Time    10/27/12  9:29 PM      Result Value Range   Squamous Epithelial / LPF RARE  RARE   WBC, UA 3-6  <3 WBC/hpf   RBC / HPF 0-2  <3 RBC/hpf   Bacteria, UA RARE  RARE   Dg Ribs Unilateral W/chest Right  10/27/2012   *RADIOLOGY REPORT*  Clinical Data: Fall, anterior right rib pain.  RIGHT RIBS AND CHEST - 3+ VIEW  Comparison: 12/17/2011  Findings: Heart is normal size.  Lingular scar or atelectasis. Increased density projects over the right apex felt to represent bony overgrowth at the first costochondral junction, stable.  No acute/confluent airspace opacities.  No effusions or pneumothorax. No visible right rib fracture.  IMPRESSION: No acute cardiopulmonary disease.   Original Report Authenticated By: Charlett Nose, M.D.    Review of Systems  Constitutional: Positive for chills. Negative for fever, weight loss, malaise/fatigue and diaphoresis.  HENT: Negative for hearing loss, neck pain and tinnitus.   Eyes: Negative for blurred vision, double vision and photophobia.  Respiratory: Negative for cough, hemoptysis and sputum production.   Cardiovascular: Positive for leg swelling. Negative for chest pain, palpitations and orthopnea.  Gastrointestinal: Positive for heartburn. Negative for nausea, vomiting and abdominal pain.  Genitourinary: Negative for dysuria, urgency and frequency.  Musculoskeletal: Negative for myalgias.  Skin: Negative for itching and rash.  Neurological: Negative for dizziness, tingling, tremors and headaches.  Endo/Heme/Allergies: Negative for environmental allergies and polydipsia.  Psychiatric/Behavioral: Negative  for depression, suicidal ideas and substance abuse.    Blood pressure 94/65, pulse 103, temperature 98.3 F (36.8 C), temperature source Oral, resp. rate 18, weight 97.523 kg (215 lb), SpO2 95.00%. Physical Exam  Nursing note and vitals reviewed. Constitutional: He is oriented to person, place, and time. He appears well-developed and well-nourished. No distress.  HENT:  Head: Normocephalic and atraumatic.  Nose: Nose normal.  Mouth/Throat: Oropharynx is clear and moist. No oropharyngeal exudate.  Eyes: Conjunctivae and EOM are normal. Pupils are equal, round, and reactive to light. No scleral icterus.  Neck: Normal range of motion. Neck supple. No JVD present. No tracheal deviation present. No thyromegaly present.  Cardiovascular: Intact distal pulses.   No murmur heard. Irregularly irregular; tachycardic  Respiratory: Effort normal and breath sounds normal. No respiratory distress. He has no wheezes. He exhibits tenderness.  Right chest wall/ribs  GI: Soft. Bowel  sounds are normal. He exhibits no distension. There is no tenderness. There is no rebound.  Musculoskeletal: Normal range of motion. He exhibits edema. He exhibits no tenderness.  Trace edema in feet/ankles  Neurological: He is alert and oriented to person, place, and time. No cranial nerve deficit. Coordination normal.  Skin: Skin is warm and dry. No rash noted. He is not diaphoretic. No erythema.  Psychiatric: He has a normal mood and affect. His behavior is normal. Judgment and thought content normal.    Labs reviewed; wbc 7.7, h/h 14.5/42.7, plt 126, troponin < 0.3, na 139, K 3.8, bun/cr 15/0.8, calcium 9.5, ast/alt 27/23, urinalysis with 3-6 wbc ECG with atrial fibrillation + RVR 4/14 ECG with NSR, RBBB Rib x-ray unrevealing Echo 1/14 reviewed; mildly dilated LV, mild LVH, EF 40%    Problem List Atrial fibrillation + RVR Mechanical fall from tripping/rib pain, right side Dyslipidemia Hypertension Borderline  hypotension GERD LV dysfunction with last known Echo EF 40%  Assessment/Plan 79 yo man with pmh of htn, hld, GERD, paroxysmal atrial fibrillation here with atrial fibrillation + RVR after coming in for a mechanical fall tripping on waterhose with unrevealing rib x-ray. He arrived with HR closer to 80s but even with going to the bathroom HR picked up to 140s. I suspect pain and the trip to hospital/ER are his triggering event of atrial fibrillation. He's asymptomatic and denies any triggers of infection, thyroid disease or heart failure. It is prudent to evaluate for thyroid disease and infection and monitor him overnight. Will obtain baseline ECG, continue tikosyn and xarelto, trend troponins x2. Likely no need for repeat echo given completion in 1/14 unless in persistent atrial fibrillation. Will continue home strategy of toprol XL, tikosyn and short acting metoprolol for breakthrough. If high rates, will add on dilitiazem IV or PO.  - continue home xarelto dosing - continue tikosyn; check ECG 2 hours after dosing while in hospital, replete K/Mg, K was 3.8, 20 meq written for on arrival - check tsh, urinalysis reviewed, last echo was 1/14 so deferred repeat - toprol Xl 50 in AM/25 mg in pm - metoprolol tartrate 25 q6h PRN HR > 110 written for; consider addition of dilitiazem for higher rate episodes - will make NPO in case he needs DCCV - continue lisinopril, PPI, all other home medications   Shanecia Hoganson 10/27/2012, 10:28 PM

## 2012-10-27 NOTE — ED Provider Notes (Signed)
History    CSN: [id number] Arrival date & time 10/27/12  1951  First MD Initiated Contact with Patient 10/27/12 2027     Chief Complaint  Patient presents with  . Fall   (Consider location/radiation/quality/duration/timing/severity/associated sxs/prior Treatment) HPI Pt had a trip and fall from standing onto his R side. C/o R chest wall pain with deep breathing and palpation. No head or neck injury. No focal weakness or numbness. No palpitations or SOB. Pt with history of afib and RVR took PO metoprolol before coming into ED.  Past Medical History  Diagnosis Date  . Hypertension   . Atrial flutter     s/p ablation in March 2012  . Hyperlipidemia   . Vitamin D deficiency   . RBBB (right bundle branch block)   . BPH (benign prostatic hyperplasia)   . Osteoarthritis   . Anemia   . Peptic ulcer disease   . DVT (deep venous thrombosis) 2/12    in setting of R hip surgery, he developed R femoral DVT  . Chronic anticoagulation     on Xarelto fo afib  . Abnormal nuclear cardiac imaging test 1/13    Moderate size inferior and apical defect with partial reversibility in the inferior wall suggestive of prior inferior and apical infarct vs thinning; mild inferior ischemia. Not gated due to ectopy.   . Paroxysmal atrial fibrillation 9/12    documented by Dr Otho Perl on office EKG 9/12  . Premature ventricular contraction    Past Surgical History  Procedure Laterality Date  . Total hip arthroplasty  06/04/10    RIGHT HIP  . Total knee arthroplasty    . Transurethral resection of prostate    . Atrial flutter ablation  5/12    by JA  . Cardiac catheterization     Family History  Problem Relation Age of Onset  . Heart attack Father   . Lung disease Mother   . Atrial fibrillation Brother   . Arrhythmia Sister    History  Substance Use Topics  . Smoking status: Former Smoker    Types: Cigarettes    Quit date: 05/06/1978  . Smokeless tobacco: Never Used  . Alcohol Use: 3.0  oz/week    5 Cans of beer per week    Review of Systems  Constitutional: Negative for fever and chills.  HENT: Negative for neck pain.   Respiratory: Negative for cough and shortness of breath.   Cardiovascular: Positive for chest pain. Negative for palpitations and leg swelling.  Gastrointestinal: Negative for nausea, vomiting and abdominal pain.  Musculoskeletal: Negative for back pain.  Skin: Negative for rash and wound.  Neurological: Negative for dizziness, syncope, weakness, light-headedness, numbness and headaches.  All other systems reviewed and are negative.    Allergies  Penicillins  Home Medications   Current Outpatient Rx  Name  Route  Sig  Dispense  Refill  . atorvastatin (LIPITOR) 10 MG tablet   Oral   Take 10 mg by mouth daily.           . Calcium Carbonate (CALCIUM 500 PO)   Oral   Take 500 mg by mouth 2 (two) times daily.          . Cholecalciferol (VITAMIN D3) 5000 UNITS TABS   Oral   Take 5,000 Units by mouth daily.           [name] Calcium (STOOL SOFTENER PO)   Oral   Take 1 capsule by mouth daily.          Marland Kitchen  dofetilide (TIKOSYN) 500 MCG capsule   Oral   Take 1 capsule (500 mcg total) by mouth every 12 (twelve) hours.   60 capsule   11   . lisinopril (PRINIVIL,ZESTRIL) 10 MG tablet   Oral   Take 1 tablet (10 mg total) by mouth daily.   30 tablet   6   . methocarbamol (ROBAXIN) 500 MG tablet   Oral   Take 500 mg by mouth 3 (three) times daily as needed. For pain         . metoprolol succinate (TOPROL-XL) 50 MG 24 hr tablet   Oral   Take 25-50 mg by mouth daily. Take 1 tablet in the morning and 1/2 in the evening         . metoprolol tartrate (LOPRESSOR) 25 MG tablet      Take 1 tablet every 6 hours as needed for fast heart rates   30 tablet   2   . Multiple Vitamin (MULITIVITAMIN) LIQD   Oral   Take 5 mLs by mouth daily.         . Multiple Vitamins-Minerals (ICAPS PO)   Oral   Take 1 tablet by mouth daily.           . Omega-3 Fatty Acids (OMEGA 3 PO)   Oral   Take 1 capsule by mouth daily.          Marland Kitchen omeprazole (PRILOSEC) 20 MG capsule   Oral   Take 20 mg by mouth daily.           . potassium chloride SA (K-DUR,KLOR-CON) 20 MEQ tablet   Oral   Take 1 tablet (20 mEq total) by mouth 2 (two) times daily.   60 tablet   11   . Rivaroxaban (XARELTO) 20 MG TABS   Oral   Take 1 tablet (20 mg total) by mouth daily.   30 tablet   3   . Tamsulosin HCl (FLOMAX) 0.4 MG CAPS   Oral   Take 0.4 mg by mouth daily.           . traMADol (ULTRAM) 50 MG tablet   Oral   Take 50 mg by mouth every 6 (six) hours as needed. For pain          BP 94/65  Pulse 103  Temp(Src) 98.3 F (36.8 C) (Oral)  Resp 18  Wt 215 lb (97.523 kg)  BMI 29.15 kg/m2  SpO2 95% Physical Exam  Nursing note and vitals reviewed. Constitutional: He is oriented to person, place, and time. He appears well-developed and well-nourished. No distress.  HENT:  Head: Normocephalic and atraumatic.  Mouth/Throat: Oropharynx is clear and moist.  No evidence of head or neck injury  Eyes: EOM are normal. Pupils are equal, round, and reactive to light.  Neck: Normal range of motion. Neck supple.  Cardiovascular:  Tachy irreg irreg  Pulmonary/Chest: Effort normal and breath sounds normal. No respiratory distress. He has no wheezes. He has no rales. He exhibits tenderness (TTP over R ant lower rib. no crepitance or deformity).  Abdominal: Soft. Bowel sounds are normal. He exhibits no distension and no mass. There is no tenderness. There is no rebound and no guarding.  Musculoskeletal: Normal range of motion. He exhibits no edema and no tenderness.  No swelling or pain  Neurological: He is alert and oriented to person, place, and time.  5/5 motor in all ext, sensation intact  Skin: Skin is warm and dry. No rash noted. No erythema.  Psychiatric: He has a normal mood and affect. His behavior is normal.    ED Course   Procedures (including critical care time) Labs Reviewed  CBC WITH DIFFERENTIAL - Abnormal; Notable for the following:    MCV 100.7 (*)    MCH 34.2 (*)    Platelets 126 (*)    Monocytes Relative 13 (*)    All other components within normal limits  COMPREHENSIVE METABOLIC PANEL - Abnormal; Notable for the following:    Glucose, Bld 127 (*)    GFR calc non Af Amer 85 (*)    All other components within normal limits  PROTIME-INR - Abnormal; Notable for the following:    Prothrombin Time 17.5 (*)    All other components within normal limits  APTT - Abnormal; Notable for the following:    aPTT 52 (*)    All other components within normal limits  URINALYSIS, ROUTINE W REFLEX MICROSCOPIC - Abnormal; Notable for the following:    Leukocytes, UA TRACE (*)    All other components within normal limits  TROPONIN I  URINE MICROSCOPIC-ADD ON   Dg Ribs Unilateral W/chest Right  10/27/2012   *RADIOLOGY REPORT*  Clinical Data: Fall, anterior right rib pain.  RIGHT RIBS AND CHEST - 3+ VIEW  Comparison: 12/17/2011  Findings: Heart is normal size.  Lingular scar or atelectasis. Increased density projects over the right apex felt to represent bony overgrowth at the first costochondral junction, stable.  No acute/confluent airspace opacities.  No effusions or pneumothorax. No visible right rib fracture.  IMPRESSION: No acute cardiopulmonary disease.   Original Report Authenticated By: Charlett Nose, M.D.   1. Atrial fibrillation with RVR   2. Chest wall contusion, left, initial encounter      Date: 10/27/2012  Rate: 116  Rhythm: atrial fibrillation  QRS Axis: normal  Intervals: QRS prolonged  ST/T Wave abnormalities: nonspecific T wave changes  Conduction Disutrbances:right bundle branch block  Narrative Interpretation:   Old EKG Reviewed: changes noted  Now in afib with RVR  MDM   Discussed with Leeann Must, MD who will accept in transfer for afib and RVR. Pt with drop in BP but is now  improved with IVF's. Do not believe urgent cardioversion needed.   Loren Racer, MD 10/27/12 2230

## 2012-10-28 ENCOUNTER — Encounter (HOSPITAL_COMMUNITY)
Admission: EM | Disposition: A | Discharge status: Home / Self Care | Payer: Self-pay | Source: Home / Self Care | Attending: Cardiology

## 2012-10-28 ENCOUNTER — Inpatient Hospital Stay (HOSPITAL_COMMUNITY): Payer: Medicare Other | Admitting: Anesthesiology

## 2012-10-28 ENCOUNTER — Encounter (HOSPITAL_COMMUNITY): Payer: Self-pay | Admitting: Anesthesiology

## 2012-10-28 ENCOUNTER — Encounter (HOSPITAL_COMMUNITY): Payer: Self-pay | Admitting: Certified Registered Nurse Anesthetist

## 2012-10-28 ENCOUNTER — Other Ambulatory Visit: Payer: Self-pay | Admitting: Physician Assistant

## 2012-10-28 DIAGNOSIS — I4891 Unspecified atrial fibrillation: Secondary | ICD-10-CM

## 2012-10-28 HISTORY — PX: CARDIOVERSION: SHX1299

## 2012-10-28 LAB — LIPID PANEL
HDL: 52 mg/dL (ref >39)
Total CHOL/HDL Ratio: 3.1 RATIO
VLDL: 17 mg/dL (ref 0–40)

## 2012-10-28 LAB — PROTIME-INR
INR: 1.43 (ref 0.00–1.49)
Prothrombin Time: 17.1 seconds — ABNORMAL HIGH (ref 11.6–15.2)

## 2012-10-28 LAB — APTT: aPTT: 45 seconds — ABNORMAL HIGH (ref 24–37)

## 2012-10-28 LAB — TSH: TSH: 1.249 u[IU]/mL (ref 0.350–4.500)

## 2012-10-28 LAB — MAGNESIUM: Magnesium: 2.2 mg/dL (ref 1.5–2.5)

## 2012-10-28 LAB — HEMOGLOBIN A1C: Hgb A1c MFr Bld: 5.8 % — ABNORMAL HIGH (ref <5.7)

## 2012-10-28 LAB — TROPONIN I: Troponin I: <0.3 ng/mL (ref <0.30)

## 2012-10-28 SURGERY — CARDIOVERSION
Anesthesia: General | Wound class: Clean

## 2012-10-28 MED ORDER — PROPOFOL 10 MG/ML IV BOLUS
INTRAVENOUS | Status: DC | PRN
  Administered 2012-10-28: 90 mg via INTRAVENOUS

## 2012-10-28 MED ORDER — POTASSIUM CHLORIDE CRYS ER 10 MEQ PO TBCR
10.0000 meq | EXTENDED_RELEASE_TABLET | Freq: Once | ORAL | Status: DC
  Filled 2012-10-28: qty 1

## 2012-10-28 MED ORDER — OMEGA-3-ACID ETHYL ESTERS 1 G PO CAPS
1.0000 | ORAL_CAPSULE | Freq: Two times a day (BID) | ORAL | Status: DC
  Administered 2012-10-28: 1 g via ORAL
  Filled 2012-10-28 (×3): qty 1

## 2012-10-28 MED ORDER — LIDOCAINE HCL (CARDIAC) 20 MG/ML IV SOLN
INTRAVENOUS | Status: DC | PRN
  Administered 2012-10-28: 50 mg via INTRAVENOUS

## 2012-10-28 MED ORDER — ATORVASTATIN CALCIUM 10 MG PO TABS: 10.0000 mg | ORAL_TABLET | Freq: Every day | ORAL | Status: DC

## 2012-10-28 MED ORDER — LISINOPRIL 10 MG PO TABS
10.0000 mg | ORAL_TABLET | Freq: Every day | ORAL | Status: DC
  Administered 2012-10-28: 10 mg via ORAL
  Filled 2012-10-28: qty 1

## 2012-10-28 MED ORDER — TAMSULOSIN HCL 0.4 MG PO CAPS
0.4000 mg | ORAL_CAPSULE | Freq: Every day | ORAL | Status: DC
  Administered 2012-10-28: 0.4 mg via ORAL
  Filled 2012-10-28: qty 1

## 2012-10-28 MED ORDER — PANTOPRAZOLE SODIUM 40 MG PO TBEC
40.0000 mg | DELAYED_RELEASE_TABLET | Freq: Every day | ORAL | Status: DC
  Administered 2012-10-28: 40 mg via ORAL
  Filled 2012-10-28: qty 1

## 2012-10-28 MED ORDER — METOPROLOL TARTRATE 25 MG PO TABS
25.0000 mg | ORAL_TABLET | Freq: Four times a day (QID) | ORAL | Status: DC | PRN
  Administered 2012-10-28: 25 mg via ORAL
  Filled 2012-10-28: qty 1

## 2012-10-28 MED ORDER — METOPROLOL SUCCINATE ER 25 MG PO TB24
25.0000 mg | ORAL_TABLET | Freq: Every day | ORAL | Status: DC
  Filled 2012-10-28: qty 1

## 2012-10-28 MED ORDER — SODIUM CHLORIDE 0.9 % IJ SOLN
3.0000 mL | Freq: Two times a day (BID) | INTRAMUSCULAR | Status: DC
  Administered 2012-10-28 (×2): 3 mL via INTRAVENOUS

## 2012-10-28 MED ORDER — POTASSIUM CHLORIDE CRYS ER 20 MEQ PO TBCR: EXTENDED_RELEASE_TABLET | ORAL | Status: DC

## 2012-10-28 MED ORDER — TRAMADOL HCL 50 MG PO TABS: 50.0000 mg | ORAL_TABLET | Freq: Four times a day (QID) | ORAL | Status: DC | PRN

## 2012-10-28 MED ORDER — POTASSIUM CHLORIDE CRYS ER 20 MEQ PO TBCR
20.0000 meq | EXTENDED_RELEASE_TABLET | Freq: Two times a day (BID) | ORAL | Status: DC
  Administered 2012-10-28 (×2): 20 meq via ORAL
  Filled 2012-10-28 (×3): qty 1

## 2012-10-28 MED ORDER — NITROGLYCERIN 0.4 MG SL SUBL: 0.4000 mg | SUBLINGUAL_TABLET | SUBLINGUAL | Status: DC | PRN

## 2012-10-28 MED ORDER — ATORVASTATIN CALCIUM 40 MG PO TABS
40.0000 mg | ORAL_TABLET | Freq: Every day | ORAL | Status: DC
  Administered 2012-10-28: 40 mg via ORAL
  Filled 2012-10-28: qty 1

## 2012-10-28 MED ORDER — ONDANSETRON HCL 4 MG/2ML IJ SOLN: 4.0000 mg | Freq: Four times a day (QID) | INTRAMUSCULAR | Status: DC | PRN

## 2012-10-28 MED ORDER — ASPIRIN EC 81 MG PO TBEC
81.0000 mg | DELAYED_RELEASE_TABLET | Freq: Every day | ORAL | Status: DC
Start: 2012-10-28 — End: 2012-10-28
  Administered 2012-10-28: 81 mg via ORAL
  Filled 2012-10-28: qty 1

## 2012-10-28 MED ORDER — DOFETILIDE 500 MCG PO CAPS
500.0000 ug | ORAL_CAPSULE | Freq: Two times a day (BID) | ORAL | Status: DC
  Administered 2012-10-28: 500 ug via ORAL
  Filled 2012-10-28 (×3): qty 1

## 2012-10-28 MED ORDER — METHOCARBAMOL 500 MG PO TABS
500.0000 mg | ORAL_TABLET | Freq: Three times a day (TID) | ORAL | Status: DC | PRN
  Filled 2012-10-28: qty 1

## 2012-10-28 MED ORDER — METOPROLOL SUCCINATE ER 25 MG PO TB24: 25.0000 mg | ORAL_TABLET | Freq: Every day | ORAL | Status: DC

## 2012-10-28 MED ORDER — METOPROLOL SUCCINATE ER 50 MG PO TB24
50.0000 mg | ORAL_TABLET | Freq: Every day | ORAL | Status: DC
  Administered 2012-10-28: 50 mg via ORAL
  Filled 2012-10-28: qty 1

## 2012-10-28 MED ORDER — ACETAMINOPHEN 325 MG PO TABS: 650.0000 mg | ORAL_TABLET | ORAL | Status: DC | PRN

## 2012-10-28 MED ORDER — VITAMIN D3 25 MCG (1000 UNIT) PO TABS
5000.0000 [IU] | ORAL_TABLET | Freq: Every day | ORAL | Status: DC
  Administered 2012-10-28: 5000 [IU] via ORAL
  Filled 2012-10-28: qty 5

## 2012-10-28 MED ORDER — SODIUM CHLORIDE 0.9 % IJ SOLN: 3.0000 mL | INTRAMUSCULAR | Status: DC | PRN

## 2012-10-28 MED ORDER — RIVAROXABAN 20 MG PO TABS
20.0000 mg | ORAL_TABLET | Freq: Every day | ORAL | Status: DC
  Administered 2012-10-28: 20 mg via ORAL
  Filled 2012-10-28: qty 1

## 2012-10-28 MED ORDER — DOCUSATE SODIUM 100 MG PO CAPS: 100.0000 mg | ORAL_CAPSULE | Freq: Two times a day (BID) | ORAL | Status: DC | PRN

## 2012-10-28 NOTE — Transfer of Care (Signed)
Immediate Anesthesia Transfer of Care Note  Patient: Connor [name]  Procedure(s) Performed: Procedure(s): CARDIOVERSION (N/A)  Patient Location: 2020   Anesthesia Type:General  Level of Consciousness: awake, alert  and oriented  Airway & Oxygen Therapy: Patient Spontanous Breathing  Post-op Assessment: report to [place], [place] [address]  Post vital signs: Reviewed and stable  Complications: No apparent anesthesia complications

## 2012-10-28 NOTE — Care Management Note (Unsigned)
    Page 1 of 1   10/28/2012     3:37:33 PM   CARE MANAGEMENT NOTE 10/28/2012  Patient:  Connor [name], Connor [name]   Account Number:  [id number]  Date Initiated:  10/28/2012  Documentation initiated by:  Quamir Willemsen  Subjective/Objective Assessment:   PT ADM ON 10/27/12 WITH FALL, AFIB.  PTA, PT INDEPENDENT, LIVES AT HOME WITH SPOUSE.     Action/Plan:   WILL FOLLOW FOR HOME NEEDS AS PT PROGRESSES.   Anticipated DC Date:  10/29/2012   Anticipated DC Plan:  HOME/SELF CARE      DC Planning Services  CM consult      Choice offered to / List presented to:             Status of service:  In process, will continue to follow Medicare Important Message given?   (If response is "NO", the following Medicare IM given date fields will be blank) Date Medicare IM given:   Date Additional Medicare IM given:    Discharge Disposition:    Per UR Regulation:  Reviewed for med. necessity/level of care/duration of stay  If discussed at Long Length of Stay Meetings, dates discussed:    Comments:

## 2012-10-28 NOTE — Progress Notes (Signed)
Pt assisted to side of bed to eat at this time; pt denies pain; wife at bedside; pt poss d/c home this afternoon; order to call PA at 1630 to let know of pt's status; will cont. To monitor.

## 2012-10-28 NOTE — CV Procedure (Addendum)
   Cardioversion Report  Connor [name] [id number] 6/25/20142:07 PM Reather Littler, MD  Procedure Performed:  1. DCCV   Operator: Verne Carrow, MD  Indication: Atrial fibrillation with RVR.  Procedure Details: Appropriate informed consent was obtained. The patient has been adequately anticoagulated with long term Xarelto. Sedation per anesthesia. He was DCCV x 1 with 150 J of biphasic energy. There were no apparent complications. Post DCCV sinus with rate 65 bpm. He remained hemodynamically stable. EKG is pending.  Recommendations: Will continue current therapy including Tikosyn and Xarelto.  Discharge home after 2 hours post DCCV (4:30pm) if stable. Follow up with Dr. Johney Frame 4 weeks.        Complications:  None; patient tolerated the procedure well.

## 2012-10-28 NOTE — Anesthesia Postprocedure Evaluation (Signed)
  Anesthesia Post-op Note  Patient: Connor [name]  Procedure(s) Performed: Procedure(s): CARDIOVERSION (N/A)  Patient Location: Nursing Unit  Anesthesia Type:General  Level of Consciousness: awake, alert  and oriented  Airway and Oxygen Therapy: Patient Spontanous Breathing  Post-op Pain: none  Post-op Assessment: Post-op Vital signs reviewed, Patient's Cardiovascular Status Stable, Respiratory Function Stable, Patent Airway and No signs of Nausea or vomiting  Post-op Vital Signs: Reviewed and stable  Complications: No apparent anesthesia complications

## 2012-10-28 NOTE — Progress Notes (Signed)
Pt ambulated 350 feet with RN; steady gait noted; HR SR 70's; pt to d/c home with wife.

## 2012-10-28 NOTE — Progress Notes (Signed)
Pt given d/c instructions; pt and wife both verbalized understanding via teach back method; IV and tele monitor d/c at this time; will cont. To monitor.

## 2012-10-28 NOTE — Progress Notes (Signed)
Pt s/p bedside cardioversion; pt now SR HR 70's; pt awake and alert; SPB 86 at this time; pt to eat, then ambulate around unit, then possible d/c home; will cont. To monitor.

## 2012-10-28 NOTE — Discharge Summary (Signed)
See full note this am and cardioversion note. cdm

## 2012-10-28 NOTE — Progress Notes (Signed)
    SUBJECTIVE: No complaints.   BP 108/69  Pulse 86  Temp(Src) 98.2 F (36.8 C) (Oral)  Resp 18  Ht 6' (1.829 m)  Wt 216 lb 4.8 oz (98.113 kg)  BMI 29.33 kg/m2  SpO2 93%  Intake/Output Summary (Last 24 hours) at 10/28/12 0708 Last data filed at 10/28/12 0540  Gross per 24 hour  Intake      0 ml  Output   1025 ml  Net  -1025 ml    PHYSICAL EXAM General: Well developed, well nourished, in no acute distress. Alert and oriented x 3.  Psych:  Good affect, responds appropriately Neck: No JVD. No masses noted.  Lungs: Clear bilaterally with no wheezes or rhonci noted.  Heart: RRR with no murmurs noted. Abdomen: Bowel sounds are present. Soft, non-tender.  Extremities: No lower extremity edema.   LABS: Basic Metabolic Panel:  Recent Labs  14/78/29 2111 10/28/12 0210  NA 139  --   K 3.8  --   CL 104  --   CO2 25  --   GLUCOSE 127*  --   BUN 15  --   CREATININE 0.80  --   CALCIUM 9.5  --   MG  --  2.2   CBC:  Recent Labs  10/27/12 2111  WBC 7.7  NEUTROABS 5.0  HGB 14.5  HCT 42.7  MCV 100.7*  PLT 126*   Cardiac Enzymes:  Recent Labs  10/27/12 2111 10/28/12 0219  TROPONINI <0.30 <0.30   Fasting Lipid Panel:  Recent Labs  10/28/12 0210  CHOL 162  HDL 52  LDLCALC 93  TRIG 87  CHOLHDL 3.1    Current Meds: . aspirin EC  81 mg Oral Daily  . atorvastatin  40 mg Oral q1800  . cholecalciferol  5,000 Units Oral Daily  . dofetilide  500 mcg Oral Q12H  . lisinopril  10 mg Oral Daily  . metoprolol succinate  25 mg Oral Q supper  . metoprolol succinate  50 mg Oral Daily  . omega-3 acid ethyl esters  1 capsule Oral BID  . pantoprazole  40 mg Oral Daily  . potassium chloride  10 mEq Oral Once  . potassium chloride SA  20 mEq Oral BID  . Rivaroxaban  20 mg Oral Q supper  . sodium chloride  3 mL Intravenous Q12H  . tamsulosin  0.4 mg Oral Daily     ASSESSMENT AND PLAN:  1. Atrial fibrillation with RVR:  He remains in atrial fibrillation this am.  Rates 110-120 resting. He has been on Tikosyn. He has been anti-coagulated with Xarelto. He is NPO. Will plan DCCV this am. R/B reviewed with pt.   MCALHANY,CHRISTOPHER  6/25/20147:08 AM

## 2012-10-28 NOTE — Anesthesia Preprocedure Evaluation (Signed)
Anesthesia Evaluation  Patient identified by MRN, date of birth, ID band Patient awake    Airway Mallampati: II  Neck ROM: Full    Dental   Pulmonary neg pulmonary ROS,  breath sounds clear to auscultation        Cardiovascular hypertension, + dysrhythmias Rhythm:Irregular Rate:Abnormal     Neuro/Psych negative neurological ROS  negative psych ROS   GI/Hepatic Neg liver ROS, PUD, GERD-  ,  Endo/Other  negative endocrine ROS  Renal/GU negative Renal ROS     Musculoskeletal negative musculoskeletal ROS (+)   Abdominal   Peds  Hematology negative hematology ROS (+)   Anesthesia Other Findings   Reproductive/Obstetrics                           Anesthesia Physical Anesthesia Plan  ASA: III  Anesthesia Plan: General   Post-op Pain Management:    Induction: Intravenous  Airway Management Planned: Mask  Additional Equipment:   Intra-op Plan:   Post-operative Plan: Extubation in OR  Informed Consent:   Dental advisory given  Plan Discussed with: CRNA and Surgeon  Anesthesia Plan Comments:         Anesthesia Quick Evaluation

## 2012-10-28 NOTE — Discharge Summary (Signed)
Discharge Summary   Patient ID: Connor [name] MRN: [id number], DOB/AGE: 1933/07/06 79 y.o. Admit date: 10/27/2012 D/C date:     10/28/2012  Primary Cardiologist: Brackbill  Primary Discharge Diagnoses:  1. Paroxysmal atrial fibrillation - recurrent afib after mechanical fall, s/p DCCV this admission - maintained on Tikosyn and Xarelto  Secondary Discharge Diagnoses:  1. HTN 2. Atrial flutter - s/p ablation in March 2012 3. HLD 4. Vit D deficiency 5. RBBB 6. BPH 7. OA 8. Anemia 9. PUD 10. DVT 06/2010 - in setting of R hip surgery, he developed R femoral DVT 11. PVC 12. Abnormal nuclear cardiac imaging test 05/2011 but f/u cath with normal coronary anatomy 06/2011  Hospital Course: Mr. Wilbon is a 79 y/o M with history of PAF on Tikosyn/Xarelto, atrial flutter, HTN, normal coronaries 06/2011 who presented to [facility] with a fall on 10/27/12. He fell and tripped in his backyard and hit his side/ribs. In the ER at [facility] his x-rays were unrevealing but his HR was 130s-150s in atrial fibrillation with BP as low as 89 sbp after receiving metoprolol without resolution/improved rate control. He was subsequently transferred to [facility] for further management/evaluation. He has actually been doing well from a-fib standpoint before this, with last episode noted 1 month ago. K was 3.8 thus was repleted. He was observed overnight but did not spontaneously convert to NSR. TSH was WNL. He reported compliance with Xarelto prior to admission. Thus he was set up for cardioversion and underwent successful DCCV to NSR this afternoon. Dr. Clifton Hridhaan has seen and examined him today and feels he is stable for discharge. We will increase his KCl slightly given K 3.8 on admission (last OP K 3.9 two months ago, and would desire to keep >4.0). Otherwise no other medication changes are recommended at this time.  Will check a BMET in 1 week to ensure KCl remains in acceptable range. He will keep his 4-week f/u  appt with Dr. Patty Sermons.  Discharge Vitals: Blood pressure 108/69, pulse 86, temperature 97.7 F (36.5 C), temperature source Oral, resp. rate 18, height 6' (1.829 m), weight 216 lb 4.8 oz (98.113 kg), SpO2 93.00%.  Labs: Lab Results  Component Value Date   WBC 7.7 10/27/2012   HGB 14.5 10/27/2012   HCT 42.7 10/27/2012   MCV 100.7* 10/27/2012   PLT 126* 10/27/2012     Recent Labs Lab 10/27/12 2111  NA 139  K 3.8  CL 104  CO2 25  BUN 15  CREATININE 0.80  CALCIUM 9.5  PROT 6.8  BILITOT 0.5  ALKPHOS 67  ALT 23  AST 27  GLUCOSE 127*    Recent Labs  10/27/12 2111 10/28/12 0219 10/28/12 0730  TROPONINI <0.30 <0.30 <0.30   Lab Results  Component Value Date   CHOL 162 10/28/2012   HDL 52 10/28/2012   LDLCALC 93 10/28/2012   TRIG 87 10/28/2012     Diagnostic Studies/Procedures   DCCV as above  Dg Ribs Unilateral W/chest Right  10/27/2012   *RADIOLOGY REPORT*  Clinical Data: Fall, anterior right rib pain.  RIGHT RIBS AND CHEST - 3+ VIEW  Comparison: 12/17/2011  Findings: Heart is normal size.  Lingular scar or atelectasis. Increased density projects over the right apex felt to represent bony overgrowth at the first costochondral junction, stable.  No acute/confluent airspace opacities.  No effusions or pneumothorax. No visible right rib fracture.  IMPRESSION: No acute cardiopulmonary disease.   Original Report Authenticated By: Charlett Nose, M.D.  Discharge Medications     Medication List    TAKE these medications       acetaminophen 500 MG tablet  Commonly known as:  TYLENOL  Take 1,000 mg by mouth every 6 (six) hours as needed for pain.     atorvastatin 10 MG tablet  Commonly known as:  LIPITOR  Take 10 mg by mouth daily.     dofetilide 500 MCG capsule  Commonly known as:  TIKOSYN  Take 1 capsule (500 mcg total) by mouth every 12 (twelve) hours.     FLOMAX 0.4 MG Caps  Generic drug:  tamsulosin  Take 0.4 mg by mouth daily.     ICAPS PO  Take 1 tablet by  mouth daily.     lisinopril 10 MG tablet  Commonly known as:  PRINIVIL,ZESTRIL  Take 1 tablet (10 mg total) by mouth daily.     methocarbamol 500 MG tablet  Commonly known as:  ROBAXIN  Take 500 mg by mouth at bedtime as needed (pain).     metoprolol succinate 50 MG 24 hr tablet  Commonly known as:  TOPROL-XL  Take 25-50 mg by mouth daily. Take 1 tablet in the morning and 1/2 in the evening     metoprolol tartrate 25 MG tablet  Commonly known as:  LOPRESSOR  Take 1 tablet every 6 hours as needed for fast heart rates     multivitamin Liqd  Take 5 mLs by mouth daily.     OMEGA 3 PO  Take 1 capsule by mouth daily.     omeprazole 20 MG capsule  Commonly known as:  PRILOSEC  Take 20 mg by mouth daily.     OVER THE COUNTER MEDICATION  Place 3 drops into both eyes as needed (dry eyes). "thera tears"     potassium chloride SA 20 MEQ tablet  Commonly known as:  K-DUR,KLOR-CON  Take 2 tablets ( ) by mouth in the morning and 1 tablet ( ) in the evening.     Rivaroxaban 20 MG Tabs  Commonly known as:  XARELTO  Take 1 tablet (20 mg total) by mouth daily.     STOOL SOFTENER PO  Take 1 capsule by mouth daily.     traMADol 50 MG tablet  Commonly known as:  ULTRAM  Take 50 mg by mouth every 6 (six) hours as needed. For pain     Vitamin D3 5000 UNITS Tabs  Take 5,000 Units by mouth daily.        Disposition   The patient will be discharged in stable condition to home. Discharge Orders   Future Appointments Provider Department Dept Phone   11/04/2012 7:30 AM [facility] Main Office [place]) [phone]   11/24/2012 10:00 AM Cassell Clement, MD [facility] Main Office [place]) [phone]   11/24/2012 10:15 AM [facility] Main Office [place]) [phone]   Future Orders Complete By Expires     Diet - low sodium heart healthy  As directed     Increase activity slowly  As directed       Follow-up Information    Follow up with [facility]. (Labwork only on 11/04/12 - can come anytime between 7:30am and 4:45pm)    Contact information:   [address] [place] [address] [phone]       Follow up with Cassell Clement, MD. (Keep appointment with Dr. Patty Sermons on 11/24/12 at 10am)    Contact information:   [address] [place] [address] [phone]          Duration of Discharge Encounter: Greater than 30 minutes including physician and PA time.  Signed, Ronie Spies PA-C 10/28/2012, 2:53 PM

## 2012-10-28 NOTE — Preoperative (Signed)
Beta Blockers   Reason not to administer Beta Blockers:Not Applicable 

## 2012-10-30 ENCOUNTER — Encounter (HOSPITAL_COMMUNITY): Payer: Self-pay | Admitting: Cardiovascular Disease

## 2012-11-04 ENCOUNTER — Other Ambulatory Visit (INDEPENDENT_AMBULATORY_CARE_PROVIDER_SITE_OTHER): Payer: Medicare Other

## 2012-11-04 DIAGNOSIS — I4891 Unspecified atrial fibrillation: Secondary | ICD-10-CM

## 2012-11-04 LAB — BASIC METABOLIC PANEL
CO2: 24 mEq/L (ref 19–32)
Chloride: 105 mEq/L (ref 96–112)
Creatinine, Ser: 0.9 mg/dL (ref 0.4–1.5)
Potassium: 4.6 mEq/L (ref 3.5–5.1)

## 2012-11-04 NOTE — Progress Notes (Signed)
Quick Note:  Please report to patient. The recent labs are stable. Continue same medication and careful diet. ______ 

## 2012-11-05 ENCOUNTER — Telehealth: Payer: Self-pay | Admitting: *Deleted

## 2012-11-05 DIAGNOSIS — E876 Hypokalemia: Secondary | ICD-10-CM

## 2012-11-05 MED ORDER — POTASSIUM CHLORIDE CRYS ER 20 MEQ PO TBCR: EXTENDED_RELEASE_TABLET | ORAL | Status: DC

## 2012-11-05 NOTE — Telephone Encounter (Signed)
Advised patient of lab results and send over new K+ Rx

## 2012-11-05 NOTE — Telephone Encounter (Signed)
Message copied by Burnell Blanks on Thu Nov 05, 2012 10:30 AM ------      Message from: Cassell Clement      Created: Wed Nov 04, 2012  9:30 PM       Please report to patient.  The recent labs are stable. Continue same medication and careful diet. ------

## 2012-11-12 ENCOUNTER — Encounter: Payer: Medicare Other | Admitting: Endocrinology

## 2012-11-12 ENCOUNTER — Encounter: Payer: Self-pay | Admitting: Endocrinology

## 2012-11-12 NOTE — Progress Notes (Signed)
Patient ID: Connor [name], male   DOB: 1933-12-27, 79 y.o.   MRN: [id number] History of Present Illness:  No visits with results within 1 Week(s) from this visit. Latest known visit with results is:  Appointment on 11/04/2012  Component Date Value Range Status  . Sodium 11/04/2012 140  135 - 145 mEq/L Final  . Potassium 11/04/2012 4.6  3.5 - 5.1 mEq/L Final  . Chloride 11/04/2012 105  96 - 112 mEq/L Final  . CO2 11/04/2012 24  19 - 32 mEq/L Final  . Glucose, Bld 11/04/2012 109* 70 - 99 mg/dL Final  . BUN 40/98/1191 18  6 - 23 mg/dL Final  . Creatinine, Ser 11/04/2012 0.9  0.4 - 1.5 mg/dL Final  . Calcium 47/82/9562 9.1  8.4 - 10.5 mg/dL Final  . GFR 13/12/6576 92.98  >60.00 mL/min Final      Medication List       This list is accurate as of: 11/12/12 12:09 PM.  Always use your most recent med list.               acetaminophen 500 MG tablet  Commonly known as:  TYLENOL  Take 1,000 mg by mouth every 6 (six) hours as needed for pain.     atorvastatin 10 MG tablet  Commonly known as:  LIPITOR  Take 10 mg by mouth daily.     dofetilide 500 MCG capsule  Commonly known as:  TIKOSYN  Take 1 capsule (500 mcg total) by mouth every 12 (twelve) hours.     FLOMAX 0.4 MG Caps  Generic drug:  tamsulosin  Take 0.4 mg by mouth daily.     ICAPS PO  Take 1 tablet by mouth daily.     lisinopril 10 MG tablet  Commonly known as:  PRINIVIL,ZESTRIL  Take 1 tablet (10 mg total) by mouth daily.     methocarbamol 500 MG tablet  Commonly known as:  ROBAXIN  Take 500 mg by mouth at bedtime as needed (pain).     metoprolol succinate 50 MG 24 hr tablet  Commonly known as:  TOPROL-XL  Take 25-50 mg by mouth daily. Take 1 tablet in the morning and 1/2 in the evening     metoprolol tartrate 25 MG tablet  Commonly known as:  LOPRESSOR  Take 1 tablet every 6 hours as needed for fast heart rates     multivitamin Liqd  Take 5 mLs by mouth daily.     OMEGA 3 PO  Take 1 capsule by mouth  daily.     omeprazole 20 MG capsule  Commonly known as:  PRILOSEC  Take 20 mg by mouth daily.     OVER THE COUNTER MEDICATION  Place 3 drops into both eyes as needed (dry eyes). "thera tears"     potassium chloride SA 20 MEQ tablet  Commonly known as:  K-DUR,KLOR-CON  Take 2 tablets ( ) by mouth in the morning and 1 tablet ( ) in the evening.     Rivaroxaban 20 MG Tabs  Commonly known as:  XARELTO  Take 1 tablet (20 mg total) by mouth daily.     STOOL SOFTENER PO  Take 1 capsule by mouth daily.     traMADol 50 MG tablet  Commonly known as:  ULTRAM  Take 50 mg by mouth every 6 (six) hours as needed. For pain     Vitamin D3 5000 UNITS Tabs  Take 5,000 Units by mouth daily.        Allergies:  Allergies  Allergen Reactions  . Penicillins Itching and Swelling    Past Medical History  Diagnosis Date  . Hypertension   . Atrial flutter     s/p ablation in March 2012  . Hyperlipidemia   . Vitamin D deficiency   . RBBB (right bundle branch block)   . BPH (benign prostatic hyperplasia)   . Osteoarthritis   . Anemia   . Peptic ulcer disease   . DVT (deep venous thrombosis) 2/12    in setting of R hip surgery, he developed R femoral DVT  . Chronic anticoagulation     on Xarelto for afib  . Abnormal nuclear cardiac imaging test 1/13    Abnormal stress test 05/2011, but cardiac cath 06/2011 showing normal coronary anatomy.  . Paroxysmal atrial fibrillation 9/12    a. documented by Dr Otho Perl on office EKG 9/12. b. maintained on Tikosyn and Xarelto. c. s/p DCCV 10/2012.  Marland Kitchen Premature ventricular contraction     Past Surgical History  Procedure Laterality Date  . Total hip arthroplasty  06/04/10    RIGHT HIP  . Total knee arthroplasty    . Transurethral resection of prostate    . Atrial flutter ablation  5/12    by JA  . Cardiac catheterization    . Cardioversion N/A 10/28/2012    Procedure: CARDIOVERSION;  Surgeon: Kathleene Hazel, MD;  Location: [facility] OR;   Service: Cardiovascular;  Laterality: N/A;    Family History  Problem Relation Age of Onset  . Heart attack Father   . Lung disease Mother   . Atrial fibrillation Brother   . Arrhythmia Sister     Social History:  reports that he quit smoking about 34 years ago. His smoking use included Cigarettes. He smoked 0.00 packs per day. He has never used smokeless tobacco. He reports that he drinks about 3.0 ounces of alcohol per week. He reports that he does not use illicit drugs.  @ROS @  @EXAM @   Assessment/Plan:  This encounter was created in error - please disregard.

## 2012-11-19 ENCOUNTER — Other Ambulatory Visit: Payer: Self-pay | Admitting: Cardiology

## 2012-11-24 ENCOUNTER — Other Ambulatory Visit (INDEPENDENT_AMBULATORY_CARE_PROVIDER_SITE_OTHER): Payer: Medicare Other

## 2012-11-24 ENCOUNTER — Encounter: Payer: Self-pay | Admitting: Cardiology

## 2012-11-24 ENCOUNTER — Ambulatory Visit (INDEPENDENT_AMBULATORY_CARE_PROVIDER_SITE_OTHER): Payer: Medicare Other | Admitting: Cardiology

## 2012-11-24 VITALS — BP 100/62 | HR 60 | Ht 71.5 in | Wt 215.0 lb

## 2012-11-24 DIAGNOSIS — I4891 Unspecified atrial fibrillation: Secondary | ICD-10-CM

## 2012-11-24 DIAGNOSIS — I480 Paroxysmal atrial fibrillation: Secondary | ICD-10-CM

## 2012-11-24 DIAGNOSIS — M1990 Unspecified osteoarthritis, unspecified site: Secondary | ICD-10-CM

## 2012-11-24 DIAGNOSIS — Z79899 Other long term (current) drug therapy: Secondary | ICD-10-CM

## 2012-11-24 DIAGNOSIS — I519 Heart disease, unspecified: Secondary | ICD-10-CM

## 2012-11-24 DIAGNOSIS — R0989 Other specified symptoms and signs involving the circulatory and respiratory systems: Secondary | ICD-10-CM

## 2012-11-24 NOTE — Assessment & Plan Note (Signed)
The patient is not having any symptoms of CHF 

## 2012-11-24 NOTE — Assessment & Plan Note (Signed)
Patient still has some residual right chest wall discomfort from his recent fall.  [facility] done at [facility] did not reveal any rib fracture

## 2012-11-24 NOTE — Assessment & Plan Note (Signed)
No further episodes of paroxysmal atrial fibrillation in the past several weeks.  No TIA symptoms.  Remains on Xarelto chronic anticoagulation

## 2012-11-24 NOTE — Patient Instructions (Addendum)
Your physician recommends that you continue on your current medications as directed. Please refer to the Current Medication list given to you today.  Your physician wants you to follow-up in: 3 month ov/bmet/mag/ekg You will receive a reminder letter in the mail two months in advance. If you don't receive a letter, please call our office to schedule the follow-up appointment.

## 2012-11-24 NOTE — Progress Notes (Signed)
[name] Date of Birth:  17-Aug-1933 [facility] [address] [place], [place]  [address] [phone]         Fax   [phone]  History of Present Illness:  This pleasant 79 year old gentleman is seen for a scheduled followup office visit. He has a history of recurrent atrial fibrillation. He has had previous ablation procedure by Dr. Johney Frame for atrial flutter. He is on long-term Xarelto for chronic anticoagulation. He has a history of high blood pressure and hypercholesterolemia. He has had a previous deep vein thrombosis in the setting of right hip surgery. He went back into atrial fibrillation in mid July 2013. He has less energy and has been more short of breath whenever he is not in sinus rhythm.  The patient is now on Tikosyn. He converted after initial doses and has been on Tikosyn. He has been tolerating the Tikosyn well.  The patient was hospitalized briefly on 10/27/12 after he fell and landed on his right chest.  He was concerned that he might have fractured a rib and went [facility] where he was noted to be in atrial fibrillation with rapid ventricular response.  He was transferred to [facility] and the following day underwent direct-current cardioversion successfully and was discharged home later that same day.  Since then he has had one additional brief episode of palpitations lasting about 12 hours on July 7.  Since then no further episodes.  Current Outpatient Prescriptions  Medication Sig Dispense Refill  . acetaminophen (TYLENOL) 500 MG tablet Take 1,000 mg by mouth every 6 (six) hours as needed for pain.      Marland Kitchen atorvastatin (LIPITOR) 10 MG tablet Take 10 mg by mouth daily.        . Cholecalciferol (VITAMIN D3) 5000 UNITS TABS Take 5,000 Units by mouth daily.        [name] Calcium (STOOL SOFTENER PO) Take 1 capsule by mouth daily.       Marland Kitchen dofetilide (TIKOSYN) 500 MCG capsule Take 1 capsule (500 mcg total) by mouth every 12  (twelve) hours.  60 capsule  11  . lisinopril (PRINIVIL,ZESTRIL) 10 MG tablet Take 1 tablet (10 mg total) by mouth daily.  30 tablet  6  . methocarbamol (ROBAXIN) 500 MG tablet Take 500 mg by mouth at bedtime as needed (pain).      . metoprolol succinate (TOPROL-XL) 50 MG 24 hr tablet Take 25-50 mg by mouth daily. Take 1 tablet in the morning and 1/2 in the evening      . metoprolol tartrate (LOPRESSOR) 25 MG tablet Take 1 tablet every 6 hours as needed for fast heart rates  30 tablet  2  . Multiple Vitamin (MULITIVITAMIN) LIQD Take 5 mLs by mouth daily.      . Multiple Vitamins-Minerals (ICAPS PO) Take 1 tablet by mouth daily.       . Omega-3 Fatty Acids (OMEGA 3 PO) Take 1 capsule by mouth daily.       Marland Kitchen omeprazole (PRILOSEC) 20 MG capsule Take 20 mg by mouth daily.        Marland Kitchen OVER THE COUNTER MEDICATION Place 3 drops into both eyes as needed (dry eyes). "thera tears"      . potassium chloride SA (K-DUR,KLOR-CON) 20 MEQ tablet Take 2 tablets ( ) by mouth in the morning and 1 tablet ( ) in the evening.  90 tablet  3  . Rivaroxaban (XARELTO) 20 MG TABS Take 1 tablet (20  mg total) by mouth daily.  30 tablet  3  . Tamsulosin HCl (FLOMAX) 0.4 MG CAPS Take 0.4 mg by mouth daily.        . traMADol (ULTRAM) 50 MG tablet Take 50 mg by mouth every 6 (six) hours as needed. For pain       No current facility-administered medications for this visit.    Allergies  Allergen Reactions  . Penicillins Itching and Swelling    Patient Active Problem List   Diagnosis Date Noted  . Atrial flutter     Priority: High  . Hypertension 08/02/2010    Priority: Medium  . Atrial fibrillation with RVR 10/27/2012  . PAF (paroxysmal atrial fibrillation) 05/28/2012  . Left ventricular dysfunction 12/17/2011  . Low back pain 09/11/2011  . Fatigue 05/22/2011  . PVC (premature ventricular contraction) 01/18/2011  . Persistent atrial fibrillation 01/12/2011  . Hyperlipidemia 08/02/2010  . Osteoarthritis  08/02/2010  . BPH (benign prostatic hyperplasia) 08/02/2010  . GERD (gastroesophageal reflux disease) 08/02/2010    History  Smoking status  . Former Smoker  . Types: Cigarettes  . Quit date: 05/06/1978  Smokeless tobacco  . Never Used    History  Alcohol Use  . 3.0 oz/week  . 5 Cans of beer per week    Family History  Problem Relation Age of Onset  . Heart attack Father   . Lung disease Mother   . Atrial fibrillation Brother   . Arrhythmia Sister     Review of Systems: Constitutional: no fever chills diaphoresis or fatigue or change in weight.  Head and neck: no hearing loss, no epistaxis, no photophobia or visual disturbance. Respiratory: No cough, shortness of breath or wheezing. Cardiovascular: No chest pain peripheral edema, palpitations. Gastrointestinal: No abdominal distention, no abdominal pain, no change in bowel habits hematochezia or melena. Genitourinary: No dysuria, no frequency, no urgency, no nocturia. Musculoskeletal:No arthralgias, no back pain, no gait disturbance or myalgias. Neurological: No dizziness, no headaches, no numbness, no seizures, no syncope, no weakness, no tremors. Hematologic: No lymphadenopathy, no easy bruising. Psychiatric: No confusion, no hallucinations, no sleep disturbance.    Physical Exam: Filed Vitals:   11/24/12 1023  BP: 100/62  Pulse: 60   the general appearance reveals a well-developed well-nourished gentleman in no distress.The head and neck exam reveals pupils equal and reactive.  Extraocular movements are full.  There is no scleral icterus.  The mouth and pharynx are normal.  The neck is supple.  The carotids reveal no bruits.  The jugular venous pressure is normal.  The  thyroid is not enlarged.  There is no lymphadenopathy.  The chest is clear to percussion and auscultation.  There are no rales or rhonchi.  Expansion of the chest is symmetrical.  The precordium is quiet.  The first heart sound is normal.  The second  heart sound is physiologically split.  There is no murmur gallop rub or click.  There is no abnormal lift or heave.  The abdomen is soft and nontender.  The bowel sounds are normal.  The liver and spleen are not enlarged.  There are no abdominal masses.  There are no abdominal bruits.  Extremities reveal good pedal pulses.  There is no phlebitis or edema.  There is no cyanosis or clubbing.  Strength is normal and symmetrical in all extremities.  There is no lateralizing weakness.  There are no sensory deficits.  The skin is warm and dry.  There is no rash.    Assessment /  Plan: Continue on same medication.  His urologist gave him a sample of Viagra for treatment of erectile dysfunction.  This will be okay for him to try along with his current cardiac meds. Recheck in 3 months for office visit EKG basal metabolic panel and magnesium level

## 2012-12-08 ENCOUNTER — Telehealth: Payer: Self-pay | Admitting: Cardiology

## 2012-12-08 NOTE — Telephone Encounter (Signed)
New Prob    Pt states his heart has been in afib twice within the last 3 days. Pt states he is also experiencing cold sweats. He is concerned and would like to speak to a nurse regarding this.

## 2012-12-08 NOTE — Telephone Encounter (Signed)
Patient states he has had two occurrences since 11/24/12 visit where his heart went out of rhythm. Also he has been experiencing intermittent periods of lightheadedness, cold sweats, nausea while standing up (but not necessarily exerting himself). When he sits down it subsides. BP normally runs 120-130's systolically and HR 80. Past few days it has been running 98/61, 102/58, 102/62 with HR 51, 62, 62.  Episodes of heart being "out of rhythm" were on Thursday 7/31, 7pm-Friday 8/1, noon. He took his extra Metoprolol 25mg  twice during that time.  The second episode was Sunday 8/3, 4am-noon. He took extra Metoprolol twice during that time period also. Patient states this started after his 11/24/12 appointment with Dr. Patty Sermons.  Dr. Patty Sermons advises patient to HOLD Lisinopril.   Patient agreed to treatment plan. He will also monitor his BP for the next couple of days and call office back on Thursday afternoon or Friday morning to provide update on current BP and symptoms (lightheadedness, nausea, cold sweats). Patient was advised to call office back sooner or go to ED (if after hours) should he develop worsening symptoms.

## 2012-12-09 ENCOUNTER — Other Ambulatory Visit: Payer: Self-pay | Admitting: Cardiology

## 2012-12-09 ENCOUNTER — Other Ambulatory Visit: Payer: Self-pay

## 2012-12-10 ENCOUNTER — Telehealth: Payer: Self-pay | Admitting: Cardiology

## 2012-12-10 ENCOUNTER — Other Ambulatory Visit: Payer: Self-pay | Admitting: Cardiology

## 2012-12-10 NOTE — Telephone Encounter (Signed)
Will forward to MD for review and recommendations  

## 2012-12-10 NOTE — Telephone Encounter (Signed)
Fax Received. Refill Completed. Connor [name] (R.M.A)   

## 2012-12-10 NOTE — Telephone Encounter (Signed)
Advise him to continue same meds. Thanks.

## 2012-12-10 NOTE — Telephone Encounter (Signed)
New Prob  Pt states he was advised by Dr Patty Sermons to call and give an update on how he is doing since his medication has changed. Pt states that his BP has been staying pretty consistent. He said he just took his BP and it as 119/65 hr 67 and this morning it was 125/73 hr 65.

## 2012-12-11 NOTE — Telephone Encounter (Signed)
Left message for pt that Dr Patty Sermons is aware of BP and HR - and to continue on same meds.  Requested he call back if questions or concerns

## 2012-12-16 ENCOUNTER — Other Ambulatory Visit: Payer: Self-pay | Admitting: *Deleted

## 2012-12-16 DIAGNOSIS — E876 Hypokalemia: Secondary | ICD-10-CM

## 2012-12-16 MED ORDER — POTASSIUM CHLORIDE CRYS ER 20 MEQ PO TBCR: EXTENDED_RELEASE_TABLET | ORAL | Status: DC

## 2012-12-19 ENCOUNTER — Other Ambulatory Visit: Payer: Self-pay | Admitting: Cardiology

## 2012-12-23 NOTE — Telephone Encounter (Signed)
Spoke with patient and confirmed he was not taking Lisinopril anymore

## 2013-01-05 ENCOUNTER — Telehealth: Payer: Self-pay | Admitting: Endocrinology

## 2013-01-05 ENCOUNTER — Other Ambulatory Visit: Payer: Self-pay | Admitting: *Deleted

## 2013-01-05 MED ORDER — TRAMADOL HCL 50 MG PO TABS: 50.0000 mg | ORAL_TABLET | Freq: Four times a day (QID) | ORAL | Status: DC | PRN

## 2013-01-05 NOTE — Telephone Encounter (Signed)
rx sent

## 2013-01-05 NOTE — Telephone Encounter (Signed)
rx printed and waiting for signature 

## 2013-01-15 ENCOUNTER — Other Ambulatory Visit: Payer: Self-pay | Admitting: Endocrinology

## 2013-01-15 ENCOUNTER — Other Ambulatory Visit: Payer: Self-pay | Admitting: Cardiology

## 2013-01-15 MED ORDER — METHOCARBAMOL 500 MG PO TABS: 500.0000 mg | ORAL_TABLET | Freq: Every evening | ORAL | Status: DC | PRN

## 2013-01-18 ENCOUNTER — Encounter: Payer: Self-pay | Admitting: Endocrinology

## 2013-01-18 ENCOUNTER — Ambulatory Visit (INDEPENDENT_AMBULATORY_CARE_PROVIDER_SITE_OTHER): Payer: Medicare Other | Admitting: Endocrinology

## 2013-01-18 VITALS — BP 124/80 | HR 64 | Temp 98.2°F | Resp 12 | Ht 71.75 in | Wt 218.8 lb

## 2013-01-18 DIAGNOSIS — D7589 Other specified diseases of blood and blood-forming organs: Secondary | ICD-10-CM

## 2013-01-18 DIAGNOSIS — E042 Nontoxic multinodular goiter: Secondary | ICD-10-CM

## 2013-01-18 DIAGNOSIS — M47812 Spondylosis without myelopathy or radiculopathy, cervical region: Secondary | ICD-10-CM | POA: Insufficient documentation

## 2013-01-18 DIAGNOSIS — E7800 Pure hypercholesterolemia, unspecified: Secondary | ICD-10-CM

## 2013-01-18 DIAGNOSIS — R7301 Impaired fasting glucose: Secondary | ICD-10-CM

## 2013-01-18 DIAGNOSIS — R42 Dizziness and giddiness: Secondary | ICD-10-CM

## 2013-01-18 NOTE — Patient Instructions (Addendum)
May take methocarbamol 3x a day

## 2013-01-18 NOTE — Progress Notes (Signed)
Patient ID: Connor [name], male   DOB: 1933-11-24, 79 y.o.   MRN: [id number]  Chief complaint: Dizziness  History of Present Illness: He has had pain in the neck area off and on for several months. Has some associated stiffness. Did not have any radiation to his arms. He thinks that when he has the neck pain and stiffness  he feels lightheaded which is more of a sensation of feeling a little unsteady However he thinks that the lightheaded feeling resolves when he is not having the neck pain He has used Robaxin and tramadol with relief of the neck pain but still continues to have this His neck. Also somewhat relieved by doing massage and he goes to a chiropractor but he feels that he had the most benefit when he had physical therapy done Not taking any nonsteroidal drugs because of history of GI bleeding  He has had some difficulty losing weight but is still exercising at the gym 3 days a week Does have a history of borderline glucose levels in the past      Medication List       This list is accurate as of: 01/18/13  1:34 PM.  Always use your most recent med list.               acetaminophen 500 MG tablet  Commonly known as:  TYLENOL  Take 1,000 mg by mouth every 6 (six) hours as needed for pain.     atorvastatin 10 MG tablet  Commonly known as:  LIPITOR  Take 10 mg by mouth daily.     FLOMAX 0.4 MG Caps capsule  Generic drug:  tamsulosin  Take 0.4 mg by mouth daily.     ICAPS PO  Take 1 tablet by mouth daily.     methocarbamol 500 MG tablet  Commonly known as:  ROBAXIN  Take 1 tablet (500 mg total) by mouth at bedtime as needed (pain).     metoprolol succinate 50 MG 24 hr tablet  Commonly known as:  TOPROL-XL  TAKE 1 TABLET IN THE MORNING AND 1/2 IN THE EVENING     metoprolol tartrate 25 MG tablet  Commonly known as:  LOPRESSOR  Take 1 tablet every 6 hours as needed for fast heart rates     multivitamin Liqd  Take 5 mLs by mouth daily.     OMEGA 3 PO  Take 1  capsule by mouth daily.     omeprazole 20 MG capsule  Commonly known as:  PRILOSEC  Take 20 mg by mouth daily.     OVER THE COUNTER MEDICATION  Place 3 drops into both eyes as needed (dry eyes). "thera tears"     potassium chloride SA 20 MEQ tablet  Commonly known as:  K-DUR,KLOR-CON  Take 2 tablets ( ) by mouth in the morning and 1 tablet ( ) in the evening.     Rivaroxaban 20 MG Tabs tablet  Commonly known as:  XARELTO  Take 1 tablet (20 mg total) by mouth daily.     STOOL SOFTENER PO  Take 1 capsule by mouth daily.     TIKOSYN 500 MCG capsule  Generic drug:  dofetilide  TAKE 1 CAPSULE BY MOUTH EVERY 12 HOURS     traMADol 50 MG tablet  Commonly known as:  ULTRAM  Take 1 tablet (50 mg total) by mouth every 6 (six) hours as needed. For pain     Vitamin D3 5000 UNITS Tabs  Take 5,000 Units by mouth  daily.        Allergies:  Allergies  Allergen Reactions  . Penicillins Itching and Swelling    Past Medical History  Diagnosis Date  . Hypertension   . Atrial flutter     s/p ablation in March 2012  . Hyperlipidemia   . Vitamin D deficiency   . RBBB (right bundle branch block)   . BPH (benign prostatic hyperplasia)   . Osteoarthritis   . Anemia   . Peptic ulcer disease   . DVT (deep venous thrombosis) 2/12    in setting of R hip surgery, he developed R femoral DVT  . Chronic anticoagulation     on Xarelto for afib  . Abnormal nuclear cardiac imaging test 1/13    Abnormal stress test 05/2011, but cardiac cath 06/2011 showing normal coronary anatomy.  . Paroxysmal atrial fibrillation 9/12    a. documented by Dr Otho Perl on office EKG 9/12. b. maintained on Tikosyn and Xarelto. c. s/p DCCV 10/2012.  Marland Kitchen Premature ventricular contraction     Past Surgical History  Procedure Laterality Date  . Total hip arthroplasty  06/04/10    RIGHT HIP  . Total knee arthroplasty    . Transurethral resection of prostate    . Atrial flutter ablation  5/12    by JA  . Cardiac  catheterization    . Cardioversion N/A 10/28/2012    Procedure: CARDIOVERSION;  Surgeon: Kathleene Hazel, MD;  Location: [facility] OR;  Service: Cardiovascular;  Laterality: N/A;    Family History  Problem Relation Age of Onset  . Heart attack Father   . Lung disease Mother   . Atrial fibrillation Brother   . Arrhythmia Sister     Social History:  reports that he quit smoking about 34 years ago. His smoking use included Cigarettes. He smoked 0.00 packs per day. He has never used smokeless tobacco. He reports that he drinks about 3.0 ounces of alcohol per week. He reports that he does not use illicit drugs.  Review of Systems   History of mild hypertension History of atrial arrhythmias managed by cardiologist History of hypercholesterolemia  LABS:  No visits with results within 1 Week(s) from this visit. Latest known visit with results is:  Appointment on 11/04/2012  Component Date Value Range Status  . Sodium 11/04/2012 140  135 - 145 mEq/L Final  . Potassium 11/04/2012 4.6  3.5 - 5.1 mEq/L Final  . Chloride 11/04/2012 105  96 - 112 mEq/L Final  . CO2 11/04/2012 24  19 - 32 mEq/L Final  . Glucose, Bld 11/04/2012 109* 70 - 99 mg/dL Final  . BUN 29/56/2130 18  6 - 23 mg/dL Final  . Creatinine, Ser 11/04/2012 0.9  0.4 - 1.5 mg/dL Final  . Calcium 86/57/8469 9.1  8.4 - 10.5 mg/dL Final  . GFR 62/95/2841 92.98  >60.00 mL/min Final    EXAM:  BP 124/80  Pulse 64  Temp(Src) 98.2 F (36.8 C)  Resp 12  Ht 5' 11.75" (1.822 m)  Wt 218 lb 12.8 oz (99.247 kg)  BMI 29.9 kg/m2  SpO2 92%  He has mild neck muscle spasm on the right side but minimal restriction of movement Carotid bruit not present Pulse is regular.  Assessment/Plan:   Cervical spondylosis causing local pain. Not clear why this is associated with an ataxic-type symptom unless there is some compromise of the vertebral blood flow. No signs or symptoms suggestive of radiculopathy or cord compression  He will start  physical therapy  and take muscle relaxants and analgesics as before  [facility] 01/18/2013, 1:34 PM

## 2013-01-20 DIAGNOSIS — E042 Nontoxic multinodular goiter: Secondary | ICD-10-CM | POA: Insufficient documentation

## 2013-02-03 ENCOUNTER — Ambulatory Visit: Payer: Medicare Other | Attending: Endocrinology | Admitting: Physical Therapy

## 2013-02-03 DIAGNOSIS — Z96659 Presence of unspecified artificial knee joint: Secondary | ICD-10-CM | POA: Insufficient documentation

## 2013-02-03 DIAGNOSIS — IMO0001 Reserved for inherently not codable concepts without codable children: Secondary | ICD-10-CM | POA: Insufficient documentation

## 2013-02-03 DIAGNOSIS — M542 Cervicalgia: Secondary | ICD-10-CM | POA: Insufficient documentation

## 2013-02-10 ENCOUNTER — Ambulatory Visit: Payer: Medicare Other | Admitting: Physical Therapy

## 2013-02-12 ENCOUNTER — Ambulatory Visit: Payer: Medicare Other | Admitting: Physical Therapy

## 2013-02-16 ENCOUNTER — Ambulatory Visit: Payer: Medicare Other | Admitting: Physical Therapy

## 2013-02-23 ENCOUNTER — Ambulatory Visit: Payer: Medicare Other | Admitting: Physical Therapy

## 2013-03-01 ENCOUNTER — Ambulatory Visit (INDEPENDENT_AMBULATORY_CARE_PROVIDER_SITE_OTHER): Payer: Medicare Other | Admitting: Cardiology

## 2013-03-01 ENCOUNTER — Encounter: Payer: Self-pay | Admitting: Cardiology

## 2013-03-01 VITALS — BP 122/70 | HR 64 | Ht 71.0 in | Wt 220.0 lb

## 2013-03-01 DIAGNOSIS — I10 Essential (primary) hypertension: Secondary | ICD-10-CM

## 2013-03-01 DIAGNOSIS — I480 Paroxysmal atrial fibrillation: Secondary | ICD-10-CM

## 2013-03-01 DIAGNOSIS — Z72820 Sleep deprivation: Secondary | ICD-10-CM

## 2013-03-01 DIAGNOSIS — Z79899 Other long term (current) drug therapy: Secondary | ICD-10-CM

## 2013-03-01 DIAGNOSIS — E7800 Pure hypercholesterolemia, unspecified: Secondary | ICD-10-CM

## 2013-03-01 DIAGNOSIS — E785 Hyperlipidemia, unspecified: Secondary | ICD-10-CM

## 2013-03-01 DIAGNOSIS — I119 Hypertensive heart disease without heart failure: Secondary | ICD-10-CM

## 2013-03-01 DIAGNOSIS — Z7689 Persons encountering health services in other specified circumstances: Secondary | ICD-10-CM

## 2013-03-01 DIAGNOSIS — I4891 Unspecified atrial fibrillation: Secondary | ICD-10-CM

## 2013-03-01 LAB — BASIC METABOLIC PANEL
BUN: 13 mg/dL (ref 6–23)
CO2: 28 mEq/L (ref 19–32)
Chloride: 107 mEq/L (ref 96–112)
GFR: 107.33 mL/min (ref >60.00)
Glucose, Bld: 97 mg/dL (ref 70–99)
Potassium: 4.1 mEq/L (ref 3.5–5.1)
Sodium: 141 mEq/L (ref 135–145)

## 2013-03-01 NOTE — Assessment & Plan Note (Signed)
The patient has a history of hyperlipidemia and this is monitored by Dr. Lucianne Muss

## 2013-03-01 NOTE — Progress Notes (Signed)
Quick Note:  Please report to patient. The recent labs are stable. Continue same medication and careful diet. ______ 

## 2013-03-01 NOTE — Assessment & Plan Note (Signed)
The patient continues to have occasional spells of atrial fibrillation usually occurring if he gets overtired.  He had 2 episodes in September and 2 episodes in October.

## 2013-03-01 NOTE — Progress Notes (Signed)
[name] Date of Birth:  1934-03-21 [address] [place], [place]  [address] [phone]         Fax   [phone]  History of Present Illness:  This pleasant 79 year old gentleman is seen for a scheduled followup office visit. He has a history of recurrent atrial fibrillation. He has had previous ablation procedure by Dr. Johney Frame for atrial flutter. He is on long-term Xarelto for chronic anticoagulation. He has a history of high blood pressure and hypercholesterolemia. He has had a previous deep vein thrombosis in the setting of right hip surgery. He went back into atrial fibrillation in mid July 2013. He has less energy and has been more short of breath whenever he is not in sinus rhythm.  The patient is now on Tikosyn. He converted after initial doses and has been on Tikosyn. He has been tolerating the Tikosyn well.  The patient was hospitalized briefly on 10/27/12 after he fell and landed on his right chest.  He was concerned that he might have fractured a rib and went [facility] where he was noted to be in atrial fibrillation with rapid ventricular response.  He was transferred to [facility] and the following day underwent direct-current cardioversion successfully and was discharged home later that same day.  The patient has had 2 brief occurrences of atrial fibrillation in October most recently on 02/27/13 when he was out of rhythm for about 4 hours.  He tolerates his atrial fibrillation okay.  Current Outpatient Prescriptions  Medication Sig Dispense Refill  . acetaminophen (TYLENOL) 500 MG tablet Take 1,000 mg by mouth every 6 (six) hours as needed for pain.      Marland Kitchen atorvastatin (LIPITOR) 10 MG tablet Take 10 mg by mouth daily.        . Cholecalciferol (VITAMIN D3) 5000 UNITS TABS Take 5,000 Units by mouth daily.        [name] Calcium (STOOL SOFTENER PO) Take 1 capsule by mouth daily.       . methocarbamol (ROBAXIN) 500 MG tablet Take 1  tablet (500 mg total) by mouth at bedtime as needed (pain).  30 tablet  1  . metoprolol succinate (TOPROL-XL) 50 MG 24 hr tablet TAKE 1 TABLET IN THE MORNING AND 1/2 IN THE EVENING  135 tablet  4  . metoprolol tartrate (LOPRESSOR) 25 MG tablet Take 1 tablet every 6 hours as needed for fast heart rates  30 tablet  2  . Multiple Vitamin (MULITIVITAMIN) LIQD Take 5 mLs by mouth daily.      . Multiple Vitamins-Minerals (ICAPS PO) Take 1 tablet by mouth daily.       . Omega-3 Fatty Acids (OMEGA 3 PO) Take 1 capsule by mouth daily.       Marland Kitchen omeprazole (PRILOSEC) 20 MG capsule Take 20 mg by mouth daily.        Marland Kitchen OVER THE COUNTER MEDICATION Place 3 drops into both eyes as needed (dry eyes). "thera tears"      . potassium chloride SA (K-DUR,KLOR-CON) 20 MEQ tablet Take 2 tablets ( ) by mouth in the morning and 1 tablet ( ) in the evening.  270 tablet  3  . Rivaroxaban (XARELTO) 20 MG TABS Take 1 tablet (20 mg total) by mouth daily.  30 tablet  3  . Tamsulosin HCl (FLOMAX) 0.4 MG CAPS Take 0.4 mg by mouth daily.        Marland Kitchen TIKOSYN 500 MCG capsule TAKE 1  CAPSULE BY MOUTH EVERY 12 HOURS  60 capsule  5  . traMADol (ULTRAM) 50 MG tablet Take 1 tablet (50 mg total) by mouth every 6 (six) hours as needed. For pain  30 tablet  5   No current facility-administered medications for this visit.    Allergies  Allergen Reactions  . Penicillins Itching and Swelling    Patient Active Problem List   Diagnosis Date Noted  . Atrial flutter     Priority: High  . Hypertension 08/02/2010    Priority: Medium  . Multinodular goiter 01/20/2013  . Cervical spondylosis without myelopathy 01/18/2013  . Atrial fibrillation with RVR 10/27/2012  . PAF (paroxysmal atrial fibrillation) 05/28/2012  . Left ventricular dysfunction 12/17/2011  . Low back pain 09/11/2011  . Fatigue 05/22/2011  . PVC (premature ventricular contraction) 01/18/2011  . Persistent atrial fibrillation 01/12/2011  . Hyperlipidemia 08/02/2010    . Osteoarthritis 08/02/2010  . BPH (benign prostatic hyperplasia) 08/02/2010  . GERD (gastroesophageal reflux disease) 08/02/2010    History  Smoking status  . Former Smoker  . Types: Cigarettes  . Quit date: 05/06/1978  Smokeless tobacco  . Never Used    History  Alcohol Use  . 3.0 oz/week  . 5 Cans of beer per week    Family History  Problem Relation Age of Onset  . Heart attack Father   . Lung disease Mother   . Atrial fibrillation Brother   . Arrhythmia Sister     Review of Systems: Constitutional: no fever chills diaphoresis or fatigue or change in weight.  Head and neck: no hearing loss, no epistaxis, no photophobia or visual disturbance. Respiratory: No cough, shortness of breath or wheezing. Cardiovascular: No chest pain peripheral edema, palpitations. Gastrointestinal: No abdominal distention, no abdominal pain, no change in bowel habits hematochezia or melena. Genitourinary: No dysuria, no frequency, no urgency, no nocturia. Musculoskeletal:No arthralgias, no back pain, no gait disturbance or myalgias. Neurological: No dizziness, no headaches, no numbness, no seizures, no syncope, no weakness, no tremors. Hematologic: No lymphadenopathy, no easy bruising. Psychiatric: No confusion, no hallucinations, no sleep disturbance.    Physical Exam: Filed Vitals:   03/01/13 1054  BP: 122/70  Pulse: 64   the general appearance reveals a well-developed well-nourished gentleman in no distress.The head and neck exam reveals pupils equal and reactive.  Extraocular movements are full.  There is no scleral icterus.  The mouth and pharynx are normal.  The neck is supple.  The carotids reveal no bruits.  The jugular venous pressure is normal.  The  thyroid is not enlarged.  There is no lymphadenopathy.  The chest is clear to percussion and auscultation.  There are no rales or rhonchi.  Expansion of the chest is symmetrical.  The precordium is quiet.  The first heart sound is  normal.  The second heart sound is physiologically split.  There is no murmur gallop rub or click.  There is no abnormal lift or heave.  The abdomen is soft and nontender.  The bowel sounds are normal.  The liver and spleen are not enlarged.  There are no abdominal masses.  There are no abdominal bruits.  Extremities reveal good pedal pulses.  There is no phlebitis or edema.  There is no cyanosis or clubbing.  Strength is normal and symmetrical in all extremities.  There is no lateralizing weakness.  There are no sensory deficits.  The skin is warm and dry.  There is no rash.  EKG today shows sinus rhythm  at 64 per minute.  The QTc interval is 515 ms.  The patient has a right bundle branch block pattern.  Since last tracing on 10/28/12, no significant change.  Assessment / Plan: Continue on same medication.   Recheck in 3 months for office visit EKG basal metabolic panel and magnesium level

## 2013-03-01 NOTE — Assessment & Plan Note (Signed)
Blood pressure was remaining stable on current therapy.  He brought in a list of his blood pressure readings which are satisfactory

## 2013-03-01 NOTE — Patient Instructions (Signed)
Will obtain labs today and call you with the results (bmet,magnesium)  Your physician recommends that you continue on your current medications as directed. Please refer to the Current Medication list given to you today.  Your physician wants you to follow-up in: 3 month ov/bmet/mag/ekg You will receive a reminder letter in the mail two months in advance. If you don't receive a letter, please call our office to schedule the follow-up appointment.   Appointment Wednesday December 3 at 10:00 for a 10:15 with Dr Vassie Loll, [phone]  Work on [employer] loss/diet

## 2013-03-02 ENCOUNTER — Ambulatory Visit: Payer: Medicare Other | Admitting: Physical Therapy

## 2013-03-02 ENCOUNTER — Telehealth: Payer: Self-pay | Admitting: *Deleted

## 2013-03-02 NOTE — Telephone Encounter (Signed)
Advised patient of lab results  

## 2013-03-02 NOTE — Telephone Encounter (Signed)
Message copied by Burnell Blanks on Tue Mar 02, 2013 10:21 AM ------      Message from: Cassell Clement      Created: Mon Mar 01, 2013  8:45 PM       Please report to patient.  The recent labs are stable. Continue same medication and careful diet. ------

## 2013-03-09 ENCOUNTER — Telehealth: Payer: Self-pay | Admitting: Endocrinology

## 2013-03-09 ENCOUNTER — Other Ambulatory Visit: Payer: Self-pay | Admitting: *Deleted

## 2013-03-09 ENCOUNTER — Ambulatory Visit: Payer: Medicare Other | Attending: Endocrinology | Admitting: Physical Therapy

## 2013-03-09 DIAGNOSIS — IMO0001 Reserved for inherently not codable concepts without codable children: Secondary | ICD-10-CM | POA: Insufficient documentation

## 2013-03-09 DIAGNOSIS — Z96659 Presence of unspecified artificial knee joint: Secondary | ICD-10-CM | POA: Insufficient documentation

## 2013-03-09 DIAGNOSIS — M542 Cervicalgia: Secondary | ICD-10-CM | POA: Insufficient documentation

## 2013-03-09 MED ORDER — METHOCARBAMOL 500 MG PO TABS: 500.0000 mg | ORAL_TABLET | Freq: Every evening | ORAL | Status: DC | PRN

## 2013-03-09 NOTE — Telephone Encounter (Signed)
rx sent

## 2013-03-11 ENCOUNTER — Other Ambulatory Visit: Payer: Self-pay

## 2013-03-12 ENCOUNTER — Telehealth: Payer: Self-pay | Admitting: Endocrinology

## 2013-03-12 NOTE — Telephone Encounter (Signed)
rx verbally called in to pharmacy. 

## 2013-03-12 NOTE — Telephone Encounter (Signed)
Needs RF on Tramadol. Says pharmacy called in request for rf on Wednesday. Please call / Sherri

## 2013-03-16 ENCOUNTER — Ambulatory Visit: Payer: Medicare Other | Admitting: Physical Therapy

## 2013-03-23 ENCOUNTER — Ambulatory Visit: Payer: Medicare Other | Admitting: Physical Therapy

## 2013-03-29 ENCOUNTER — Telehealth: Payer: Self-pay | Admitting: Cardiology

## 2013-03-29 NOTE — Telephone Encounter (Signed)
New message     Pt in afib--pt has had several metoprolol within the last 24hrs but still in afib. Can he double up on rx?

## 2013-03-29 NOTE — Telephone Encounter (Signed)
Patient went out of rhythm yesterday around 5:00 pm with heart rate around 112. Has had 4 Metoprolol tartrate 25 mg since then. Heart rate now 79, blood pressure 129/92. Discussed with  Dr. Patty Sermons and will have patient increase his Toprol 50 mg to full tablet and ok to use another Metoprolol tartrate 25 mg if needed. Call back if he doesn't convert. Advised patient

## 2013-03-29 NOTE — Telephone Encounter (Signed)
Will forward to  Dr. Brackbill for review 

## 2013-03-30 ENCOUNTER — Encounter: Payer: Self-pay | Admitting: Cardiology

## 2013-04-06 ENCOUNTER — Ambulatory Visit: Payer: Medicare Other | Attending: Endocrinology | Admitting: Physical Therapy

## 2013-04-06 DIAGNOSIS — IMO0001 Reserved for inherently not codable concepts without codable children: Secondary | ICD-10-CM | POA: Insufficient documentation

## 2013-04-06 DIAGNOSIS — Z96659 Presence of unspecified artificial knee joint: Secondary | ICD-10-CM | POA: Insufficient documentation

## 2013-04-06 DIAGNOSIS — M542 Cervicalgia: Secondary | ICD-10-CM | POA: Insufficient documentation

## 2013-04-07 ENCOUNTER — Ambulatory Visit (INDEPENDENT_AMBULATORY_CARE_PROVIDER_SITE_OTHER): Payer: Medicare Other | Admitting: Pulmonary Disease

## 2013-04-07 ENCOUNTER — Encounter: Payer: Self-pay | Admitting: Pulmonary Disease

## 2013-04-07 VITALS — BP 124/76 | HR 73 | Ht 72.0 in | Wt 220.0 lb

## 2013-04-07 DIAGNOSIS — G4733 Obstructive sleep apnea (adult) (pediatric): Secondary | ICD-10-CM

## 2013-04-07 NOTE — Assessment & Plan Note (Signed)
Schedule sleep study  We discussed benefits of treating obstructive sleep apnea on atrial fibrillation  Given excessive daytime somnolence, narrow pharyngeal exam, witnessed apneas & loud snoring, obstructive sleep apnea is very likely & an overnight polysomnogram will be scheduled as a split study. The pathophysiology of obstructive sleep apnea , it's cardiovascular consequences & modes of treatment including CPAP were discused with the patient in detail & they evidenced understanding.

## 2013-04-07 NOTE — Progress Notes (Signed)
Subjective:    Patient ID: Connor [name], male    DOB: [birth year], 79 y.o.   MRN: [id number]  HPI   79 year old with recurrent atrial fibrillation presents for evaluation of obstructive sleep apnea.  He  had  ablation procedure by Dr. Johney Frame for atrial flutter and underwent cardioversion in June 2014. He continues to have recurrences of atrial fibrillation. He is on  Xarelto . He has a history of high blood pressure and hypercholesterolemia. He  had a DVT after right hip replacement in feb 2012 with resolution on duplex  in 01/2012.  Echo - nml LV fn Wife has noted pauses in her snoring and this prompted an evaluation. Bedtime is around 11 PM, sleep latency is minimal, he sleeps on his right side or his back with one concave pillow. He is 2 nocturnal awakenings including bathroom visit and is out of bed at 7:30 AM feeling rested without morning headaches or dryness of mouth. He denies weight gain in the last few years. He reports nasal injury as a child and chronic nasal blockage which is helped by using Breathe Right strips. There is no history suggestive of cataplexy, sleep paralysis or parasomnias He can tell when his heart is out of rhythm by feeling of palpitations and weakness. Epworth sleepiness score is 50 24  Past Medical History  Diagnosis Date  . Hypertension   . Atrial flutter     s/p ablation in March 2012  . Hyperlipidemia   . Vitamin D deficiency   . RBBB (right bundle branch block)   . BPH (benign prostatic hyperplasia)   . Osteoarthritis   . Anemia   . Peptic ulcer disease   . DVT (deep venous thrombosis) 2/12    in setting of R hip surgery, he developed R femoral DVT  . Chronic anticoagulation     on Xarelto for afib  . Abnormal nuclear cardiac imaging test 1/13    Abnormal stress test 05/2011, but cardiac cath 06/2011 showing normal coronary anatomy.  . Paroxysmal atrial fibrillation 9/12    a. documented by Dr Otho Perl on office EKG 9/12. b. maintained on Tikosyn  and Xarelto. c. s/p DCCV 10/2012.  Marland Kitchen Premature ventricular contraction     Past Surgical History  Procedure Laterality Date  . Total hip arthroplasty  06/04/10    RIGHT HIP  . Total knee arthroplasty    . Transurethral resection of prostate    . Atrial flutter ablation  5/12    by JA  . Cardiac catheterization    . Cardioversion N/A 10/28/2012    Procedure: CARDIOVERSION;  Surgeon: Kathleene Hazel, MD;  Location: [facility] OR;  Service: Cardiovascular;  Laterality: N/A;    Allergies  Allergen Reactions  . Penicillins Itching and Swelling    History   Social History  . Marital Status: Married    Spouse Name: N/A    Number of Children: N/A  . Years of Education: N/A   Occupational History  . Not on file.   Social History Main Topics  . Smoking status: Former Smoker -- 1.00 packs/day for 30 years    Types: Cigarettes    Quit date: 05/06/1978  . Smokeless tobacco: Never Used  . Alcohol Use: 3.0 oz/week    5 Cans of beer per week  . Drug Use: No  . Sexual Activity: Not on file   Other Topics Concern  . Not on file   Social History Narrative   he patient lives in [place] with  his spouse    Family History  Problem Relation Age of Onset  . Heart attack Father   . Lung disease Mother   . Atrial fibrillation Brother   . Arrhythmia Sister       Review of Systems  Constitutional: Negative for fever and unexpected weight change.  HENT: Negative for congestion, dental problem, ear pain, nosebleeds, postnasal drip, rhinorrhea, sinus pressure, sneezing, sore throat and trouble swallowing.   Eyes: Negative for redness and itching.  Respiratory: Negative for cough, chest tightness, shortness of breath and wheezing.   Cardiovascular: Positive for leg swelling. Negative for palpitations.  Gastrointestinal: Negative for nausea and vomiting.  Genitourinary: Negative for dysuria.  Musculoskeletal: Negative for joint swelling.  Skin: Negative for rash.  Hematological:  Bruises/bleeds easily.  Psychiatric/Behavioral: Negative for dysphoric mood. The patient is not nervous/anxious.        Objective:   Physical Exam  Gen. Pleasant, well-nourished, in no distress, normal affect ENT - no lesions, no post nasal drip Neck: No JVD, no thyromegaly, no carotid bruits Lungs: no use of accessory muscles, no dullness to percussion, clear without rales or rhonchi  Cardiovascular: Rhythm irregular, heart sounds  normal, no murmurs or gallops, no peripheral edema Abdomen: soft and non-tender, no hepatosplenomegaly, BS normal. Musculoskeletal: No deformities, no cyanosis or clubbing Neuro:  alert, non focal       Assessment & Plan:

## 2013-04-07 NOTE — Patient Instructions (Addendum)
Schedule sleep study  We discussed benefits of treating obstructive sleep apnea on atrial fibrillation

## 2013-04-13 ENCOUNTER — Ambulatory Visit: Payer: Medicare Other | Admitting: Physical Therapy

## 2013-04-15 ENCOUNTER — Ambulatory Visit: Payer: Medicare Other | Admitting: Physical Therapy

## 2013-04-20 ENCOUNTER — Ambulatory Visit: Payer: Medicare Other | Admitting: Physical Therapy

## 2013-04-23 ENCOUNTER — Other Ambulatory Visit: Payer: Self-pay | Admitting: Cardiology

## 2013-05-04 ENCOUNTER — Other Ambulatory Visit: Payer: Self-pay | Admitting: *Deleted

## 2013-05-04 MED ORDER — METHOCARBAMOL 500 MG PO TABS: 500.0000 mg | ORAL_TABLET | Freq: Every evening | ORAL | Status: DC | PRN

## 2013-05-19 ENCOUNTER — Ambulatory Visit (HOSPITAL_BASED_OUTPATIENT_CLINIC_OR_DEPARTMENT_OTHER): Payer: Medicare Other | Attending: Pulmonary Disease | Admitting: Radiology

## 2013-05-19 VITALS — Ht 72.0 in | Wt 220.0 lb

## 2013-05-19 DIAGNOSIS — I4891 Unspecified atrial fibrillation: Secondary | ICD-10-CM | POA: Insufficient documentation

## 2013-05-19 DIAGNOSIS — R0989 Other specified symptoms and signs involving the circulatory and respiratory systems: Secondary | ICD-10-CM | POA: Insufficient documentation

## 2013-05-19 DIAGNOSIS — G4733 Obstructive sleep apnea (adult) (pediatric): Secondary | ICD-10-CM

## 2013-05-19 DIAGNOSIS — G4761 Periodic limb movement disorder: Secondary | ICD-10-CM | POA: Insufficient documentation

## 2013-05-19 DIAGNOSIS — R0609 Other forms of dyspnea: Secondary | ICD-10-CM | POA: Insufficient documentation

## 2013-05-20 ENCOUNTER — Telehealth: Payer: Self-pay | Admitting: Cardiology

## 2013-05-20 NOTE — Telephone Encounter (Signed)
New problem    Pt's dentist would like to know whether pt can stay on Xarelto before and after his procedure?   Pt is having a bone grafting 1/29.   Cynda Familia, DDS #  [phone]

## 2013-05-20 NOTE — Telephone Encounter (Signed)
I spoke with pt & he is aware Dr. Mare Ferrari is out of the office at this time.  We will call pt back with Dr. Sherryl Barters orders about the Xarelto & send fax to his dentist Pt agrees with this plan  Forwarded to Dr. Mare Ferrari & Eulis Foster RN

## 2013-05-21 NOTE — Telephone Encounter (Signed)
Advised patient and sent to Dr Georges Lynch

## 2013-05-21 NOTE — Telephone Encounter (Signed)
Would omit xarelto the day before the procedure and the day of the procedure then restart one day after the procedure.

## 2013-05-24 ENCOUNTER — Telehealth: Payer: Self-pay | Admitting: Cardiology

## 2013-05-24 NOTE — Telephone Encounter (Signed)
New message   Have a couple of more questions to ask before upcoming dental appt.

## 2013-05-24 NOTE — Telephone Encounter (Signed)
Patient wanting to discuss upcoming dental procedure and Xarelto. Patient has history of going in and out of AFib. Is concerned about going into Afib while holding Xarelto for procedure. Advised patient if he should go out of rhythm while holding Xarelto to resume and reschedule surgery. Patient verbalized understanding.

## 2013-05-24 NOTE — Telephone Encounter (Signed)
Agree with advice given

## 2013-05-26 ENCOUNTER — Other Ambulatory Visit: Payer: Self-pay | Admitting: Cardiology

## 2013-05-26 DIAGNOSIS — N400 Enlarged prostate without lower urinary tract symptoms: Secondary | ICD-10-CM

## 2013-05-27 ENCOUNTER — Telehealth: Payer: Self-pay | Admitting: Pulmonary Disease

## 2013-05-27 ENCOUNTER — Encounter: Payer: Self-pay | Admitting: Cardiology

## 2013-05-27 DIAGNOSIS — G4730 Sleep apnea, unspecified: Secondary | ICD-10-CM

## 2013-05-27 DIAGNOSIS — G4733 Obstructive sleep apnea (adult) (pediatric): Secondary | ICD-10-CM

## 2013-05-27 DIAGNOSIS — G4710 Hypersomnia, unspecified: Secondary | ICD-10-CM

## 2013-05-27 NOTE — Sleep Study (Signed)
[place]   NAME: Connor [name]  DATE OF BIRTH: 10/28/1933  MEDICAL RECORD [id number]  LOCATION: [facility]   PHYSICIAN: Nina Hoar V.   DATE OF STUDY: 05/19/13   SLEEP STUDY TYPE: Nocturnal Polysomnogram   REFERRING PHYSICIAN: Rigoberto Noel, MD   INDICATION FOR STUDY:  80 year old with recurrent atrial fibrillation presented with witnessed apneas & snoring. At the time of this study ,they weighed 220 pounds with a height of 6 ft 0 inches and the BMI of 30, neck size of 17 inches. Epworth sleepiness score was 9   This nocturnal polysomnogram was performed with a sleep technologist in attendance. EEG, EOG,EMG and respiratory parameters recorded. Sleep stages, arousals, limb movements and respiratory data was scored according to criteria laid out by the American Academy of sleep medicine.   SLEEP ARCHITECTURE: Lights out was at 22-16 PM and lights on was at 4-59 AM. Total sleep time was 318 minutes with a sleep period time of 360 minutes and a sleep efficiency of 79 %. Sleep latency was 41 minutes with latency to REM sleep of 78 minutes and wake after sleep onset of 43 minutes. . Sleep stages as a percentage of total sleep time was N1 -10 %,N2- 75 % and REM sleep 15 % ( 48 minutes) . The longest period of REM sleep was around 4-30 AM.   AROUSAL DATA : There were 122  arousals with an arousal index of 23 events per hour. Most of these were spontaneous & 47 were associated with respiratory events  RESPIRATORY DATA: There were 10 obstructive apneas, 25 central apneas, 2 mixed apneas and 57 hypopneas with apnea -hypopnea index of 18 events per hour. There were 16 RERAs with an RDI of  21 events per hour. There was no relation to sleep stage or body position. Supine sleep was noted  MOVEMENT/PARASOMNIA: There were 318 PLMS with a PLM index of 60 events per hour. The PLM arousal index was 5.7 per hour.  OXYGEN DATA: The lowest desaturation was  81 % during nREM sleep and the desaturation index was 21 per hour.   CARDIAC DATA: The low heart rate was 35 beats per minute. The high heart rate recorded was an artifact. No arrhythmias were noted   DISCUSSION -Loud snoring was noted . She did not meet criteria for CPAP intervention. She was desensitized with a large full face mask  IMPRESSION :  1. Moderate obstructive sleep apnea with hypopneas causing sleep fragmentation and moderate oxygen desaturation.  2. No evidence of cardiac arrhythmias or behavioral disturbance during sleep.  3. Sleep efficiency was acceptable 4.Moderate periodic limb movements were noted, with arousals, significance unclear - wouldlike to see if they improve with CPAP therapy  RECOMMENDATION:  1. Treatment options for this degree of sleep disordered breathing include weight loss and/ or CPAP therapy. 2. Patient should be cautioned against driving when sleepy  3. They should be asked to avoid medications with sedative side effects    Jermond Burkemper V.  Diplomate, Tax adviser of Sleep Medicine    ELECTRONICALLY SIGNED ON: 05/27/2013  [facility]  PH: [phone] FX: [phone] [place]

## 2013-05-27 NOTE — Telephone Encounter (Signed)
Moderate OSA with AHI 21/h Suggest CPAP titration study, then OV

## 2013-05-27 NOTE — Telephone Encounter (Signed)
I spoke with patient about results and he verbalized understanding and had no questions and order has been placed

## 2013-05-27 NOTE — Progress Notes (Signed)
This encounter was created in error - please disregard.

## 2013-06-01 ENCOUNTER — Telehealth: Payer: Self-pay | Admitting: Cardiology

## 2013-06-01 NOTE — Telephone Encounter (Signed)
New message   Procedure on tomorrow has questions regarding medication.

## 2013-06-01 NOTE — Telephone Encounter (Signed)
Patient had episode of A fib last night and is scheduled for dental procedure tomorrow. He is concerned with going into Afib tomorrow evening and not getting his Xarelto tomorrow night. Wants to know if he should take Xarelto if that should happen. Will forward to  Dr. Mare Ferrari for review.

## 2013-06-01 NOTE — Telephone Encounter (Signed)
Advised patient

## 2013-06-01 NOTE — Telephone Encounter (Signed)
Yes if he goes into atrial fib tomorrow evening he should take his xarelto.

## 2013-06-11 ENCOUNTER — Telehealth: Payer: Self-pay | Admitting: Cardiology

## 2013-06-11 NOTE — Telephone Encounter (Signed)
Calling stating his insurance needs prior auth for his Xarelto.  Will send to Samantha Crimes to get prior authorization.

## 2013-06-11 NOTE — Telephone Encounter (Signed)
New message  [employer] will not approve xarelto anyone. Please advise.

## 2013-06-14 ENCOUNTER — Other Ambulatory Visit: Payer: Self-pay | Admitting: *Deleted

## 2013-06-14 MED ORDER — ATORVASTATIN CALCIUM 10 MG PO TABS: 10.0000 mg | ORAL_TABLET | Freq: Every day | ORAL | Status: DC

## 2013-06-14 NOTE — Telephone Encounter (Signed)
[employer] rx approved xarelto through 06/14/2014 PA #[id number]

## 2013-06-23 ENCOUNTER — Ambulatory Visit (HOSPITAL_BASED_OUTPATIENT_CLINIC_OR_DEPARTMENT_OTHER): Payer: Medicare Other

## 2013-06-30 ENCOUNTER — Other Ambulatory Visit: Payer: Self-pay | Admitting: Cardiology

## 2013-07-08 ENCOUNTER — Other Ambulatory Visit: Payer: Self-pay | Admitting: Internal Medicine

## 2013-07-09 ENCOUNTER — Telehealth: Payer: Self-pay | Admitting: Cardiology

## 2013-07-09 NOTE — Telephone Encounter (Signed)
Spoke with patient earlier today and he had been in A fib for a while. Had put patient on hold to discuss with  Dr. Mare Ferrari and while he was on hold stated he converted.

## 2013-07-09 NOTE — Telephone Encounter (Signed)
New message     Patient need to discuss with nurse Metoprolol 25 mg . C/O Afib now.

## 2013-07-19 ENCOUNTER — Other Ambulatory Visit: Payer: Self-pay | Admitting: *Deleted

## 2013-07-19 MED ORDER — TRAMADOL HCL 50 MG PO TABS: 50.0000 mg | ORAL_TABLET | Freq: Four times a day (QID) | ORAL | Status: DC | PRN

## 2013-07-20 ENCOUNTER — Encounter (HOSPITAL_BASED_OUTPATIENT_CLINIC_OR_DEPARTMENT_OTHER): Payer: Medicare Other

## 2013-07-27 ENCOUNTER — Encounter: Payer: Self-pay | Admitting: Cardiology

## 2013-07-28 ENCOUNTER — Ambulatory Visit (HOSPITAL_BASED_OUTPATIENT_CLINIC_OR_DEPARTMENT_OTHER): Payer: Medicare Other | Attending: Pulmonary Disease | Admitting: Radiology

## 2013-07-28 VITALS — Ht 72.0 in | Wt 220.0 lb

## 2013-07-28 DIAGNOSIS — G4733 Obstructive sleep apnea (adult) (pediatric): Secondary | ICD-10-CM

## 2013-08-03 ENCOUNTER — Telehealth: Payer: Self-pay | Admitting: Pulmonary Disease

## 2013-08-03 DIAGNOSIS — G4733 Obstructive sleep apnea (adult) (pediatric): Secondary | ICD-10-CM

## 2013-08-03 DIAGNOSIS — G4730 Sleep apnea, unspecified: Secondary | ICD-10-CM

## 2013-08-03 DIAGNOSIS — G4710 Hypersomnia, unspecified: Secondary | ICD-10-CM

## 2013-08-03 NOTE — Telephone Encounter (Signed)
I spoke with patient about results and he verbalized understanding and had no questions. Pt reports he will call once he gets set up bc he is going on vacation.  Nothing further needed

## 2013-08-03 NOTE — Telephone Encounter (Signed)
Titration study reviewed. Order sent to DME for  CPAP of 11 centimeters with a large full face mask. Schedule followup in one month with download

## 2013-08-03 NOTE — Sleep Study (Signed)
[place]  NAME: Connor [name]  DATE OF BIRTH: June 18, 1933  MEDICAL RECORD NUMBER [id number]  LOCATION: [facility]  PHYSICIAN: ALVA,RAKESH V.  DATE OF STUDY-  07/28/13  SLEEP STUDY TYPE: CPAP titration study               REFERRING PHYSICIAN: Rigoberto Noel, MD  INDICATION FOR STUDY: 80 year old with recurrent atrial fibrillation and moderate obstructive sleep apnea.overnight polysomnogram showed moderate OSA with AHI 21/h  At the time of this study ,they weighed 220 pounds with a height of  6 ft 0 inches and the BMI of 30, neck size of 17 inches. Epworth sleepiness score was 9   This CPAP titration polysomnogram was performed with a sleep technologist in attendance. EEG, EOG,EMG and respiratory parameters recorded. Sleep stages, arousals, limb movements and respiratory data was scored according to criteria laid out by the American Academy of sleep medicine.  SLEEP ARCHITECTURE: Lights out was at 2312 PM and lights on was at 508 AM. Total sleep time was 257 minutes with sleep latency of 7.5 minutes with latency to REM sleep of 41 minutes and wake after sleep onset of 92 minutes. Sleep efficiency was poor at 72 with frequent long periods of awakening. Sleep stages as a percentage of total sleep time was N1 -11%,N2- 72% and REM sleep 16.5 % ( 42 minutes) . The longest period of REM sleep was around 3 AM.   AROUSAL DATA : There were 53 arousals with an arousal index of 12.3 events per hour. Of these 40 were spontaneous, and 13 were associated with respiratory events and 0 were associated periodic limb movements  RESPIRATORY DATA: CPAP was initiated at 5 centimeters and titrated to a final level of less than centimeters due to respiratory events and snoring. At the final level of 11 centimeters, there were 0 obstructive apneas, 0 central apneas, 0 mixed apneas and 1 hypopneas with apnea -hypopnea index of 1 events per hour.  There was no relation to  sleep stage or body position. Titration was optimal.  MOVEMENT/PARASOMNIA: There were 4 PLMS with a PLM index of 0.9 events per hour. The PLM arousal index was 0 events per hour.  OXYGEN DATA: The lowest desaturation was 67 % during non-REM sleep and the desaturation index was 12 per hour. The saturations stayed below 88% for 2 minutes.  CARDIAC DATA: The low heart rate was 30 beats per minute. The high heart rate recorded was an artifact. No arrhythmias were noted   IMPRESSION :  1. moderate obstructive sleep apnea with hypopneas causing sleep fragmentation and mild oxygen desaturation. 2. this was corrected by CPAP of 11 centimeters with a large full face mask. Titration was optimal. 3. No evidence of cardiac arrhythmias or behavioral disturbance during sleep. 4.Periodic limb movements were not noted.  RECOMMENDATION:    1. The treatment options for this degree of sleep disordered breathing includes weight loss and/or CPAP therapy. CPAP can be initiated at 11 centimeters with a large full face mask and compliance monitored at this level. 2. Patient should be cautioned against driving when sleepy 3. They should be asked to avoid medications with sedative side effects  ALVA,RAKESH V. Diplomate, Tax adviser of Sleep Medicine  ELECTRONICALLY SIGNED ON:  04/06/2013, 1:24 PM [place] PH: [phone]   FX: [phone] [place]

## 2013-08-04 ENCOUNTER — Telehealth: Payer: Self-pay | Admitting: Cardiology

## 2013-08-04 NOTE — Telephone Encounter (Signed)
New Message  Pt called states that he has  been in Afib for two days. Requests a call back to discuss

## 2013-08-04 NOTE — Telephone Encounter (Signed)
Agree with advice given

## 2013-08-04 NOTE — Telephone Encounter (Signed)
Out of rhythm from Monday went back in this morning. This afternoon patient had 3 episodes of right sided chest pain lasting 5-10 seconds about 1 minute apart. Pain level about 3-4 and he described it as being close to the surface pain. Patient denied any radiating pain or shortness of breath. Patient since then has done normal activities with no pain/problems. Patient was planning to out of town tomorrow but now wants to wait and see how he does.  Did explain  Dr. Mare Ferrari out of the office this week and this did not sound cardiac related. He will call back Friday with update. Advised patient if he had any chest pain that came back that did not subside to go to ED, verbalized understanding.

## 2013-08-06 NOTE — Telephone Encounter (Signed)
New message    Patient calling back stating he doing fine.

## 2013-08-06 NOTE — Telephone Encounter (Signed)
Spoke with wife and advised if any changes/problems to call back.

## 2013-08-12 ENCOUNTER — Ambulatory Visit: Payer: Medicare Other | Admitting: Cardiology

## 2013-08-23 ENCOUNTER — Telehealth: Payer: Self-pay | Admitting: Pulmonary Disease

## 2013-08-23 NOTE — Telephone Encounter (Signed)
I called spoke with pt. appt scheduled. Nothing further needed

## 2013-08-25 ENCOUNTER — Encounter: Payer: Self-pay | Admitting: Cardiology

## 2013-08-25 ENCOUNTER — Ambulatory Visit (INDEPENDENT_AMBULATORY_CARE_PROVIDER_SITE_OTHER): Payer: Medicare Other | Admitting: Cardiology

## 2013-08-25 VITALS — BP 120/62 | HR 82 | Ht 72.0 in | Wt 222.0 lb

## 2013-08-25 DIAGNOSIS — G4733 Obstructive sleep apnea (adult) (pediatric): Secondary | ICD-10-CM

## 2013-08-25 DIAGNOSIS — Z72820 Sleep deprivation: Secondary | ICD-10-CM

## 2013-08-25 DIAGNOSIS — I480 Paroxysmal atrial fibrillation: Secondary | ICD-10-CM

## 2013-08-25 DIAGNOSIS — I4891 Unspecified atrial fibrillation: Secondary | ICD-10-CM

## 2013-08-25 DIAGNOSIS — Z7689 Persons encountering health services in other specified circumstances: Secondary | ICD-10-CM

## 2013-08-25 DIAGNOSIS — I119 Hypertensive heart disease without heart failure: Secondary | ICD-10-CM

## 2013-08-25 DIAGNOSIS — I519 Heart disease, unspecified: Secondary | ICD-10-CM

## 2013-08-25 NOTE — Progress Notes (Signed)
Connor [name] Date of Birth:  10-12-1933 [address] [place],   [address] [phone]         Fax   [phone]  History of Present Illness:  This pleasant 80 year old gentleman is seen for a scheduled followup office visit. He has a history of recurrent atrial fibrillation. He has had previous ablation procedure by Dr. Rayann Heman for atrial flutter. He is on long-term Xarelto for chronic anticoagulation. He has a history of high blood pressure and hypercholesterolemia. He has had a previous deep vein thrombosis in the setting of right hip surgery.  He has less energy and has been more short of breath whenever he is not in sinus rhythm.  The patient is now on Tikosyn. He converted after initial doses and has been on Tikosyn. He has been tolerating the Tikosyn well.  The patient was hospitalized briefly on 10/27/12 after he fell and landed on his right chest.  He was concerned that he might have fractured a rib and went [facility] [place] [facility] where he was noted to be in atrial fibrillation with rapid ventricular response.  He was transferred to [facility] and the following day underwent direct-current cardioversion successfully and was discharged home later that same day.  The patient has had 2 brief occurrences of atrial fibrillation in October most recently on 02/27/13 when he was out of rhythm for about 4 hours.  Today he brought in a computerized compendium of each episode of atrial fib and how long it lasts.  He takes extra metoprolol to help bring it back to sinus rhythm.  Current Outpatient Prescriptions  Medication Sig Dispense Refill  . acetaminophen (TYLENOL) 500 MG tablet Take 1,000 mg by mouth every 6 (six) hours as needed for pain.      Marland Kitchen atorvastatin (LIPITOR) 10 MG tablet Take 1 tablet (10 mg total) by mouth daily.  30 tablet  5  . Cholecalciferol (VITAMIN D3) 5000 UNITS TABS Take 5,000 Units by mouth daily.        Mariane Baumgarten Calcium (STOOL SOFTENER  PO) Take 1 capsule by mouth daily.       . methocarbamol (ROBAXIN) 500 MG tablet Take 1 tablet (500 mg total) by mouth at bedtime as needed (pain).  30 tablet  3  . metoprolol succinate (TOPROL-XL) 50 MG 24 hr tablet TAKE 1 TABLET IN THE MORNING AND 1/2 IN THE EVENING  135 tablet  4  . metoprolol tartrate (LOPRESSOR) 25 MG tablet TAKE 1 TABLET EVERY 6 HOURS AS NEEDED FOR FAST HEART RATES  30 tablet  0  . Multiple Vitamin (MULITIVITAMIN) LIQD Take 5 mLs by mouth daily.      . Multiple Vitamins-Minerals (ICAPS PO) Take 1 tablet by mouth daily.       . Omega-3 Fatty Acids (OMEGA 3 PO) Take 1 capsule by mouth daily.       Marland Kitchen omeprazole (PRILOSEC) 20 MG capsule Take 20 mg by mouth daily.        Marland Kitchen OVER THE COUNTER MEDICATION Place 3 drops into both eyes as needed (dry eyes). "thera tears"      . potassium chloride SA (K-DUR,KLOR-CON) 20 MEQ tablet Take 2 tablets (34meq) by mouth in the morning and 1 tablet (53meq) in the evening.  270 tablet  3  . tamsulosin (FLOMAX) 0.4 MG CAPS capsule TAKE 1 CAPSULE 30 MINUTES AFTER THE SAME MEAL EACH DAY ORALLY ONCE A DAY PER UROLOGIST  30 capsule  6  .  TIKOSYN 500 MCG capsule TAKE 1 CAPSULE BY MOUTH EVERY 12 HOURS  60 capsule  4  . traMADol (ULTRAM) 50 MG tablet Take 1 tablet (50 mg total) by mouth every 6 (six) hours as needed. For pain  60 tablet  2  . XARELTO 20 MG TABS tablet TAKE 1 TABLET EVERY DAY  30 tablet  7   No current facility-administered medications for this visit.    Allergies  Allergen Reactions  . Penicillins Itching and Swelling    Patient Active Problem List   Diagnosis Date Noted  . Atrial flutter     Priority: High  . Hypertension 08/02/2010    Priority: Medium  . OSA (obstructive sleep apnea) 04/07/2013  . Multinodular goiter 01/20/2013  . Cervical spondylosis without myelopathy 01/18/2013  . Atrial fibrillation with RVR 10/27/2012  . PAF (paroxysmal atrial fibrillation) 05/28/2012  . Left ventricular dysfunction 12/17/2011  .  Low back pain 09/11/2011  . Fatigue 05/22/2011  . PVC (premature ventricular contraction) 01/18/2011  . Persistent atrial fibrillation 01/12/2011  . Hyperlipidemia 08/02/2010  . Osteoarthritis 08/02/2010  . BPH (benign prostatic hyperplasia) 08/02/2010  . GERD (gastroesophageal reflux disease) 08/02/2010    History  Smoking status  . Former Smoker -- 1.00 packs/day for 30 years  . Types: Cigarettes  . Quit date: 05/06/1978  Smokeless tobacco  . Never Used    History  Alcohol Use  . 3.0 oz/week  . 5 Cans of beer per week    Family History  Problem Relation Age of Onset  . Heart attack Father   . Lung disease Mother   . Atrial fibrillation Brother   . Arrhythmia Sister     Review of Systems: Constitutional: no fever chills diaphoresis or fatigue or change in weight.  Head and neck: no hearing loss, no epistaxis, no photophobia or visual disturbance. Respiratory: No cough, shortness of breath or wheezing. Cardiovascular: No chest pain peripheral edema, palpitations. Gastrointestinal: No abdominal distention, no abdominal pain, no change in bowel habits hematochezia or melena. Genitourinary: No dysuria, no frequency, no urgency, no nocturia. Musculoskeletal:No arthralgias, no back pain, no gait disturbance or myalgias. Neurological: No dizziness, no headaches, no numbness, no seizures, no syncope, no weakness, no tremors. Hematologic: No lymphadenopathy, no easy bruising. Psychiatric: No confusion, no hallucinations, no sleep disturbance.    Physical Exam: Filed Vitals:   08/25/13 1608  BP: 120/62  Pulse: 82   the general appearance reveals a well-developed well-nourished gentleman in no distress.The head and neck exam reveals pupils equal and reactive.  Extraocular movements are full.  There is no scleral icterus.  The mouth and pharynx are normal.  The neck is supple.  The carotids reveal no bruits.  The jugular venous pressure is normal.  The  thyroid is not  enlarged.  There is no lymphadenopathy.  The chest is clear to percussion and auscultation.  There are no rales or rhonchi.  Expansion of the chest is symmetrical.  The precordium is quiet.  The first heart sound is normal.  The second heart sound is physiologically split.  There is no murmur gallop rub or click.  There is no abnormal lift or heave.  The abdomen is soft and nontender.  The bowel sounds are normal.  The liver and spleen are not enlarged.  There are no abdominal masses.  There are no abdominal bruits.  Extremities reveal good pedal pulses.  There is no phlebitis or edema.  There is no cyanosis or clubbing.  Strength is normal  and symmetrical in all extremities.  There is no lateralizing weakness.  There are no sensory deficits.  The skin is warm and dry.  There is no rash.  EKG today shows normal sinus rhythm at 82 per minute.  PR interval 166 ms.  QRS duration 146 ms and he has a right bundle branch block.  His QTc interval is 521 ms.  Assessment / Plan: Continue on same medication.  We are checking a CBC, basal metabolic panel, and magnesium level today.  Continue current dose of Tikosyn. Recheck in 3 months for office visit EKG basal metabolic panel and magnesium level

## 2013-08-25 NOTE — Assessment & Plan Note (Signed)
He has sleep apnea.  He will have a followup visit with Dr. Elsworth Soho in one month after starting his CPAP machine.  So far he appears to be tolerating the machine without difficulty.

## 2013-08-25 NOTE — Assessment & Plan Note (Signed)
There is no pattern to his atrial fibrillation.  Sometimes it starts in the morning and sometimes in the evening.  He has been staying off all alcohol and beer.  He did not start his CPAP machine until just last week.  Hopefully this will make an improvement in the decreasing frequency of atrial fib going forward.

## 2013-08-25 NOTE — Assessment & Plan Note (Signed)
The patient has not been experiencing any symptoms of congestive heart failure.  Exercise tolerance is good.

## 2013-08-25 NOTE — Patient Instructions (Signed)
Will obtain labs today and call you with the results (bemt,magnesium,cbc)  Work harder on diet and weight loss  Your physician recommends that you continue on your current medications as directed. Please refer to the Current Medication list given to you today.  Your physician wants you to follow-up in: 3 month ov/ekg/bmet/magnesium level You will receive a reminder letter in the mail two months in advance. If you don't receive a letter, please call our office to schedule the follow-up appointment.

## 2013-08-26 ENCOUNTER — Telehealth: Payer: Self-pay | Admitting: *Deleted

## 2013-08-26 LAB — CBC WITH DIFFERENTIAL/PLATELET
BASOS PCT: 0.3 % (ref 0.0–3.0)
Basophils Absolute: 0 10*3/uL (ref 0.0–0.1)
Eosinophils Absolute: 0.2 10*3/uL (ref 0.0–0.7)
Eosinophils Relative: 3.5 % (ref 0.0–5.0)
HEMATOCRIT: 45 % (ref 39.0–52.0)
Hemoglobin: 15.1 g/dL (ref 13.0–17.0)
LYMPHS ABS: 1.4 10*3/uL (ref 0.7–4.0)
Lymphocytes Relative: 25 % (ref 12.0–46.0)
MCHC: 33.5 g/dL (ref 30.0–36.0)
MCV: 101.1 fl — AB (ref 78.0–100.0)
MONO ABS: 0.6 10*3/uL (ref 0.1–1.0)
Monocytes Relative: 11 % (ref 3.0–12.0)
NEUTROS PCT: 60.2 % (ref 43.0–77.0)
Neutro Abs: 3.3 10*3/uL (ref 1.4–7.7)
Platelets: 123 10*3/uL — ABNORMAL LOW (ref 150.0–400.0)
RBC: 4.45 Mil/uL (ref 4.22–5.81)
RDW: 14.3 % (ref 11.5–14.6)
WBC: 5.5 10*3/uL (ref 4.5–10.5)

## 2013-08-26 LAB — BASIC METABOLIC PANEL
BUN: 13 mg/dL (ref 6–23)
CHLORIDE: 104 meq/L (ref 96–112)
CO2: 31 mEq/L (ref 19–32)
Calcium: 8.8 mg/dL (ref 8.4–10.5)
Creatinine, Ser: 0.8 mg/dL (ref 0.4–1.5)
GFR: 100.96 mL/min (ref >60.00)
GLUCOSE: 99 mg/dL (ref 70–99)
Potassium: 4.1 mEq/L (ref 3.5–5.1)
SODIUM: 141 meq/L (ref 135–145)

## 2013-08-26 LAB — MAGNESIUM: MAGNESIUM: 2.2 mg/dL (ref 1.5–2.5)

## 2013-08-26 NOTE — Telephone Encounter (Signed)
Mailed copy of labs and left message to call if any questions  

## 2013-08-26 NOTE — Telephone Encounter (Signed)
Message copied by Earvin Hansen on Thu Aug 26, 2013 12:17 PM ------      Message from: Darlin Coco      Created: Thu Aug 26, 2013 11:16 AM       labs are satisfactory.  Continue same medication. ------

## 2013-08-31 ENCOUNTER — Ambulatory Visit: Payer: Medicare Other | Admitting: Cardiology

## 2013-09-03 ENCOUNTER — Other Ambulatory Visit: Payer: Self-pay | Admitting: *Deleted

## 2013-09-03 MED ORDER — METHOCARBAMOL 500 MG PO TABS: 500.0000 mg | ORAL_TABLET | Freq: Every evening | ORAL | Status: DC | PRN

## 2013-09-03 NOTE — Telephone Encounter (Signed)
Rx for Methocarbamol 500mg  approved,printed and faxed to the pharmacy.//AB/CMA

## 2013-09-14 ENCOUNTER — Other Ambulatory Visit: Payer: Medicare Other

## 2013-09-14 ENCOUNTER — Other Ambulatory Visit (INDEPENDENT_AMBULATORY_CARE_PROVIDER_SITE_OTHER): Payer: Medicare Other

## 2013-09-14 DIAGNOSIS — D7589 Other specified diseases of blood and blood-forming organs: Secondary | ICD-10-CM

## 2013-09-14 DIAGNOSIS — R7301 Impaired fasting glucose: Secondary | ICD-10-CM

## 2013-09-14 DIAGNOSIS — E042 Nontoxic multinodular goiter: Secondary | ICD-10-CM

## 2013-09-14 LAB — CBC WITH DIFFERENTIAL/PLATELET
Basophils Absolute: 0 10*3/uL (ref 0.0–0.1)
Basophils Relative: 0.5 % (ref 0.0–3.0)
Eosinophils Absolute: 0.2 10*3/uL (ref 0.0–0.7)
Eosinophils Relative: 3.9 % (ref 0.0–5.0)
HEMATOCRIT: 43.5 % (ref 39.0–52.0)
HEMOGLOBIN: 14.5 g/dL (ref 13.0–17.0)
LYMPHS ABS: 1.2 10*3/uL (ref 0.7–4.0)
Lymphocytes Relative: 22.1 % (ref 12.0–46.0)
MCHC: 33.3 g/dL (ref 30.0–36.0)
MCV: 100.7 fl — ABNORMAL HIGH (ref 78.0–100.0)
MONOS PCT: 11.3 % (ref 3.0–12.0)
Monocytes Absolute: 0.6 10*3/uL (ref 0.1–1.0)
NEUTROS ABS: 3.4 10*3/uL (ref 1.4–7.7)
Neutrophils Relative %: 62.2 % (ref 43.0–77.0)
Platelets: 127 10*3/uL — ABNORMAL LOW (ref 150.0–400.0)
RBC: 4.32 Mil/uL (ref 4.22–5.81)
RDW: 14.2 % (ref 11.5–15.5)
WBC: 5.4 10*3/uL (ref 4.0–10.5)

## 2013-09-14 LAB — TSH: TSH: 0.85 u[IU]/mL (ref 0.35–4.50)

## 2013-09-16 ENCOUNTER — Other Ambulatory Visit: Payer: Medicare Other

## 2013-09-16 ENCOUNTER — Other Ambulatory Visit: Payer: Self-pay | Admitting: *Deleted

## 2013-09-16 ENCOUNTER — Ambulatory Visit (INDEPENDENT_AMBULATORY_CARE_PROVIDER_SITE_OTHER): Payer: Medicare Other | Admitting: Endocrinology

## 2013-09-16 ENCOUNTER — Encounter: Payer: Self-pay | Admitting: Endocrinology

## 2013-09-16 ENCOUNTER — Ambulatory Visit: Payer: Medicare Other

## 2013-09-16 VITALS — BP 124/60 | HR 60 | Temp 97.9°F | Resp 14 | Ht 71.75 in | Wt 219.4 lb

## 2013-09-16 DIAGNOSIS — E785 Hyperlipidemia, unspecified: Secondary | ICD-10-CM

## 2013-09-16 DIAGNOSIS — N400 Enlarged prostate without lower urinary tract symptoms: Secondary | ICD-10-CM

## 2013-09-16 DIAGNOSIS — I10 Essential (primary) hypertension: Secondary | ICD-10-CM

## 2013-09-16 DIAGNOSIS — Z23 Encounter for immunization: Secondary | ICD-10-CM

## 2013-09-16 DIAGNOSIS — Z0000 Encounter for general adult medical examination without abnormal findings: Secondary | ICD-10-CM

## 2013-09-16 LAB — URINALYSIS, ROUTINE W REFLEX MICROSCOPIC
Bilirubin Urine: NEGATIVE
Ketones, ur: NEGATIVE
NITRITE: NEGATIVE
PH: 5.5 (ref 5.0–8.0)
Specific Gravity, Urine: >=1.03 — AB (ref 1.000–1.030)
Total Protein, Urine: NEGATIVE
URINE GLUCOSE: NEGATIVE
Urobilinogen, UA: 0.2 (ref 0.0–1.0)

## 2013-09-16 LAB — COMPREHENSIVE METABOLIC PANEL
ALBUMIN: 3.9 g/dL (ref 3.5–5.2)
ALT: 26 U/L (ref 0–53)
AST: 26 U/L (ref 0–37)
Alkaline Phosphatase: 57 U/L (ref 39–117)
BILIRUBIN TOTAL: 1.1 mg/dL (ref 0.2–1.2)
BUN: 16 mg/dL (ref 6–23)
CO2: 21 mEq/L (ref 19–32)
Calcium: 9.1 mg/dL (ref 8.4–10.5)
Chloride: 111 mEq/L (ref 96–112)
Creatinine, Ser: 0.8 mg/dL (ref 0.4–1.5)
GFR: 103.97 mL/min (ref >60.00)
GLUCOSE: 94 mg/dL (ref 70–99)
POTASSIUM: 4.4 meq/L (ref 3.5–5.1)
SODIUM: 144 meq/L (ref 135–145)
TOTAL PROTEIN: 6.4 g/dL (ref 6.0–8.3)

## 2013-09-16 LAB — LIPID PANEL
Cholesterol: 146 mg/dL (ref 0–200)
HDL: 47.7 mg/dL (ref >39.00)
LDL Cholesterol: 85 mg/dL (ref 0–99)
Total CHOL/HDL Ratio: 3
Triglycerides: 69 mg/dL (ref 0.0–149.0)
VLDL: 13.8 mg/dL (ref 0.0–40.0)

## 2013-09-16 MED ORDER — PNEUMOCOCCAL 13-VAL CONJ VACC IM SUSP: 0.5000 mL | INTRAMUSCULAR | Status: DC

## 2013-09-16 NOTE — Patient Instructions (Signed)
Aerobic exercise and some weights 3 times a week  Balanced meals with reduced amounts of simple sugars and carbohydrates, have some protein at each meal Stool Hemoccult test to be completed

## 2013-09-16 NOTE — Progress Notes (Signed)
Patient ID: Connor [name], male   DOB: August 30, 1933, 80 y.o.   MRN: [id number]     Subjective:   Patient here for annual preventive exam and management of chronic problems.    Risk factors:     Does have a history of mild impaired fasting glucose levels in the past  Mild obesity: He has had recent difficulty losing weight , trying to be irregular with exercising at the gym up to 3 days a week. He thinks he is controlling portions and trying to work on reducing carbohydrates  Hypercholesterolemia: has had LDL levels up to 189.  Has been careful with reducing saturated fats.  LDL controlled with Lipitor for several years   Roster of Physicians Providing Medical Care to Patient:  See "snapshot" section of EMR  PREVENTIVE CARE:  Diet: As above Exercise: Goes to the gym using bike, weights, treadmill up to 40 minutes, 2-3/7 days a week History of falls: None   Annual hemoccults:           2014   vitamin D supplement:                               5000 units daily   Colonoscopy/sigmoidoscopy 2011      Yearly flu vaccine:                      yes    Zostavax:  2008   Pneumovax:  2000  Tetanus booster:   ? 1992    Lipid screening, last levels:  Lab Results  Component Value Date   CHOL 162 10/28/2012   HDL 52 10/28/2012   LDLCALC 93 10/28/2012   TRIG 87 10/28/2012   CHOLHDL 3.1 10/28/2012   Has been maintained on Lipitor for several years for hypercholesterolemia with baseline LDL levels up to 189   Activities of Daily Living: In his present state of health, has no difficulty performing daily activities   Home Safety: Has smoke detector and wears seat belts.   Depression Screen  See separate section  Review of Systems:      Has history of mild hypertension, treated with metoprolol ER   Checking blood pressure almost daily at home and recent readings are ranging from 107-134/57-64, keeping a record of his cellphone and these were reviewed today. Only rarely will have a blood  pressure outside this range, has lower blood pressure when he has atrial fibrillation     Eyes: Normal.        ENT: Decreased hearing especially if background noise present. Using bilateral hearing aids                    Thyroid:  No cold or heat intolerance.  No unusual fatigue.  Has had a small multinodular goiter since at least 1998, has been euthyroid  Lab Results  Component Value Date   TSH 0.85 09/14/2013       No chest pain on exertion.                     Has periodic palpitations from recurrent atrial fibrillation. is taking Tikosyn and is on Xarelto followed by cardiologist regularly He was taken next the metoprolol then he has rapid heart rate associated with palpitations      No swelling of feet except mildly at the end of the day     No shortness of breath on  exertion.     Bowel habits:  No change.       Rectal bleeding/black stools: Not present.     No numbness or tingling in hands or feet .       Prior history of meatal stenosis, no difficulty with urine flow now and continues on Flomax for BPH from urologist, no significant nocturia or frequency     Episodes of lightheadedness/dizziness since about 2000. No symptoms recently. Occasionally may feel transiently off balance. He was having more symptoms on his last visit in 9/14 but he feels that with physical therapy and traction his symptoms are much better  Has had back pain from degenerated discs at L2-3, 3-4 and 4-5 levels and scoliosis. Has been taking  tramadol for relief and also massage therapy helps.  Minimal knee joint pain recently  Weight control: Recently not losing weight:  Wt Readings from Last 3 Encounters:  09/16/13 219 lb 6.4 oz (99.519 kg)  08/25/13 222 lb (100.699 kg)  07/28/13 220 lb (99.791 kg)        Medication List       This list is accurate as of: 09/16/13  9:51 AM.  Always use your most recent med list.               acetaminophen 500 MG tablet  Commonly known as:  TYLENOL   Take 1,000 mg by mouth every 6 (six) hours as needed for pain.     atorvastatin 10 MG tablet  Commonly known as:  LIPITOR  Take 1 tablet (10 mg total) by mouth daily.     ICAPS PO  Take 1 tablet by mouth daily.     methocarbamol 500 MG tablet  Commonly known as:  ROBAXIN  Take 1 tablet (500 mg total) by mouth at bedtime as needed (pain).     metoprolol succinate 50 MG 24 hr tablet  Commonly known as:  TOPROL-XL  TAKE 1 TABLET IN THE MORNING AND 1/2 IN THE EVENING     metoprolol tartrate 25 MG tablet  Commonly known as:  LOPRESSOR  TAKE 1 TABLET EVERY 6 HOURS AS NEEDED FOR FAST HEART RATES     multivitamin Liqd  Take 5 mLs by mouth daily.     OMEGA 3 PO  Take 1 capsule by mouth daily.     omeprazole 20 MG capsule  Commonly known as:  PRILOSEC  Take 20 mg by mouth daily.     OVER THE COUNTER MEDICATION  Place 3 drops into both eyes as needed (dry eyes). "thera tears"     potassium chloride SA 20 MEQ tablet  Commonly known as:  K-DUR,KLOR-CON  Take 2 tablets (7meq) by mouth in the morning and 1 tablet (33meq) in the evening.     STOOL SOFTENER PO  Take 1 capsule by mouth every 3 (three) days.     tamsulosin 0.4 MG Caps capsule  Commonly known as:  FLOMAX  TAKE 1 CAPSULE 30 MINUTES AFTER THE SAME MEAL EACH DAY ORALLY ONCE A DAY PER UROLOGIST     TIKOSYN 500 MCG capsule  Generic drug:  dofetilide  TAKE 1 CAPSULE BY MOUTH EVERY 12 HOURS     traMADol 50 MG tablet  Commonly known as:  ULTRAM  Take 1 tablet (50 mg total) by mouth every 6 (six) hours as needed. For pain     Vitamin D3 5000 UNITS Tabs  Take 5,000 Units by mouth daily.     XARELTO 20 MG Tabs tablet  Generic drug:  rivaroxaban  TAKE 1 TABLET EVERY DAY        Allergies:  Allergies  Allergen Reactions  . Penicillins Itching and Swelling    Past Medical History  Diagnosis Date  . Hypertension   . Atrial flutter     s/p ablation in March 2012  . Hyperlipidemia   . Vitamin D deficiency   .  RBBB (right bundle branch block)   . BPH (benign prostatic hyperplasia)   . Osteoarthritis   . Anemia   . Peptic ulcer disease   . DVT (deep venous thrombosis) 2/12    in setting of R hip surgery, he developed R femoral DVT  . Chronic anticoagulation     on Xarelto for afib  . Abnormal nuclear cardiac imaging test 1/13    Abnormal stress test 05/2011, but cardiac cath 06/2011 showing normal coronary anatomy.  . Paroxysmal atrial fibrillation 9/12    a. documented by Dr Barrett Shell on office EKG 9/12. b. maintained on Tikosyn and Xarelto. c. s/p DCCV 10/2012.  Marland Kitchen Premature ventricular contraction     Past Surgical History  Procedure Laterality Date  . Total hip arthroplasty  06/04/10    RIGHT HIP  . Total knee arthroplasty    . Transurethral resection of prostate    . Atrial flutter ablation  5/12    by JA  . Cardiac catheterization    . Cardioversion N/A 10/28/2012    Procedure: CARDIOVERSION;  Surgeon: Burnell Blanks, MD;  Location: [facility] OR;  Service: Cardiovascular;  Laterality: N/A;    Family History  Problem Relation Age of Onset  . Heart attack Father   . Lung disease Mother   . Atrial fibrillation Brother   . Arrhythmia Sister     Social History:  reports that he quit smoking about 35 years ago. His smoking use included Cigarettes. He has a 30 pack-year smoking history. He has never used smokeless tobacco. He reports that he drinks about 3 ounces of alcohol per week. He reports that he does not use illicit drugs.    EXAM:  BP 124/60  Pulse 60  Temp(Src) 97.9 F (36.6 C)  Resp 14  Ht 5' 11.75" (1.822 m)  Wt 219 lb 6.4 oz (99.519 kg)  BMI 29.98 kg/m2  SpO2 96%  GENERAL:  Large build, no abdominal obesity .  No pallor, clubbing, lymphadenopathy or edema.  Skin:  no rash or pigmentation.   EYES:  Externally normal.  Fundii:  normal discs and vessels.  ENT:  oral cavity and pharynx normal. Tympanic membranes are opaque, otherwise intact.   THYROID:   just  palpable on the lower right lobe, firm  CAROTIDS:  Normal character; no bruit.  HEART:  Normal apex, S1 split, S2 normal, no S3/S4 ; no murmur.  CHEST:  Normal shape Lungs:  Vescicular breath sounds bilaterally.  No crepitations/ wheeze.  ABDOMEN:  No distention. no abnormal epigastric pulsation.  Liver and spleen not palpable.  No other mass or tenderness.  RECTAL exam : Deferred to urologist  NEUROLOGICAL:  1+ ankle jerk right side and minimal on left.  Monofilament sensation normal on the toes  SPINE AND JOINTS:  scoliosis lumbar spine. Also clawfoot bilaterally    PERIPHERAL PULSES: pedal pulses normal    Assessment/Plan:   During the course of the visit the patient was educated and counseled about appropriate screening and preventive services including:   Diabetes screening  Nutrition counseling low Carbs sweets portions  Vaccines: Prevnar given  today, discussed benefits Screening for colon cancer: Hemoccult given today Not a candidate for PSA testing Needs followup for annual exam once a year Continue regular exercise regimen at least 3 days a week  Active medical problems:  Hyperlipidemia: His lipids are under good level and he will continue Lipitor 10 mg daily for hypercholesterolemia  Hypertension: Blood pressure is well controlled with metoprolol  Dizziness/lightheadedness: Apparently related to cervical spondylosis as treatment of this has helped symptomatically and he will continue home traction  BPH: To continue followup with urologist  Atrial fibrillation: He will followup with cardiologist as scheduled  History of multinodular goiter: This is less prominent now and TSH is normal  Elayne Snare 09/16/2013, 9:51 AM

## 2013-09-17 NOTE — Progress Notes (Signed)
Quick Note:  Please let patient know that the sugar, lipid result is normal ______

## 2013-09-22 ENCOUNTER — Other Ambulatory Visit: Payer: Self-pay | Admitting: Cardiology

## 2013-09-22 ENCOUNTER — Ambulatory Visit (INDEPENDENT_AMBULATORY_CARE_PROVIDER_SITE_OTHER): Payer: Medicare Other | Admitting: Pulmonary Disease

## 2013-09-22 ENCOUNTER — Encounter: Payer: Self-pay | Admitting: Pulmonary Disease

## 2013-09-22 VITALS — BP 124/74 | HR 67 | Ht 71.25 in | Wt 222.8 lb

## 2013-09-22 DIAGNOSIS — G4733 Obstructive sleep apnea (adult) (pediatric): Secondary | ICD-10-CM

## 2013-09-22 NOTE — Progress Notes (Signed)
   Subjective:    Patient ID: Connor [name], male    DOB: August 22, 1933, 80 y.o.   MRN: [id number]  HPI  80 year old with recurrent atrial fibrillation presents for FU of obstructive sleep apnea.  He had ablation procedure by Dr. Rayann Heman for atrial flutter and underwent cardioversion in June 2014. He continues to have recurrences of atrial fibrillation. He is on Xarelto . He has a history of high blood pressure and hypercholesterolemia. He had a DVT after right hip replacement in feb 2012 with resolution on duplex in 01/2012.  Echo - nml LV fn  Wife has noted pauses in her snoring and this prompted an evaluation. Bedtime is around 11 PM, sleep latency is minimal, he sleeps on his right side or his back with one concave pillow. He is 2 nocturnal awakenings including bathroom visit and is out of bed at 7:30 AM feeling rested without morning headaches or dryness of mouth. He denies weight gain in the last few years.  He reports nasal injury as a child and chronic nasal blockage which is helped by using Breathe Right strips.    Chief Complaint  Patient presents with  . Follow-up    Pt reports he wears CPAP everynight. Has slight more energy during day and not as sleepy. Still would like to take an afternoon nap.     He can tell when his heart is out of rhythm by feeling of palpitations and weakness.  Epworth sleepiness score is 5/ 24  PSG 05/2013 Moderate OSA with AHI 21/h 07/2013 CPAP of 11 centimeters with a large full face mask Download -residual AHI 5/h on 11 cm, no leak, good usage 6h   Review of Systems neg for any significant sore throat, dysphagia, itching, sneezing, nasal congestion or excess/ purulent secretions, fever, chills, sweats, unintended wt loss, pleuritic or exertional cp, hempoptysis, orthopnea pnd or change in chronic leg swelling. Also denies presyncope, palpitations, heartburn, abdominal pain, nausea, vomiting, diarrhea or change in bowel or urinary habits,  dysuria,hematuria, rash, arthralgias, visual complaints, headache, numbness weakness or ataxia.     Objective:   Physical Exam  Gen. Pleasant, obese, in no distress ENT - no lesions, no post nasal drip Neck: No JVD, no thyromegaly, no carotid bruits Lungs: no use of accessory muscles, no dullness to percussion, decreased without rales or rhonchi  Cardiovascular: Rhythm regular, heart sounds  normal, no murmurs or gallops, no peripheral edema Musculoskeletal: No deformities, no cyanosis or clubbing , no tremors       Assessment & Plan:

## 2013-09-22 NOTE — Assessment & Plan Note (Signed)
Your CPAP is set at 11 cm Ok to trial nasal mask in 3 months  Weight loss encouraged, compliance with goal of at least 4-6 hrs every night is the expectation. Advised against medications with sedative side effects Cautioned against driving when sleepy - understanding that sleepiness will vary on a day to day basis

## 2013-09-22 NOTE — Patient Instructions (Signed)
Your CPAP is set at 11 cm Ok to trial nasal mask in 3 months

## 2013-09-23 ENCOUNTER — Other Ambulatory Visit: Payer: Self-pay | Admitting: Cardiology

## 2013-10-04 ENCOUNTER — Other Ambulatory Visit: Payer: Self-pay | Admitting: *Deleted

## 2013-10-04 ENCOUNTER — Other Ambulatory Visit: Payer: Medicare Other

## 2013-10-04 ENCOUNTER — Other Ambulatory Visit: Payer: Self-pay | Admitting: Pulmonary Disease

## 2013-10-04 DIAGNOSIS — Z1211 Encounter for screening for malignant neoplasm of colon: Secondary | ICD-10-CM

## 2013-10-04 LAB — FECAL OCCULT BLOOD, IMMUNOCHEMICAL: Fecal Occult Bld: NEGATIVE

## 2013-10-04 NOTE — Progress Notes (Signed)
Error

## 2013-10-06 ENCOUNTER — Other Ambulatory Visit: Payer: Self-pay | Admitting: Cardiology

## 2013-10-11 ENCOUNTER — Other Ambulatory Visit: Payer: Self-pay | Admitting: *Deleted

## 2013-10-11 MED ORDER — TRAMADOL HCL 50 MG PO TABS: 50.0000 mg | ORAL_TABLET | Freq: Four times a day (QID) | ORAL | Status: DC | PRN

## 2013-10-12 ENCOUNTER — Telehealth: Payer: Self-pay | Admitting: *Deleted

## 2013-10-12 NOTE — Telephone Encounter (Signed)
Rx sent 

## 2013-10-12 NOTE — Telephone Encounter (Signed)
Need RX refill on tramadol going out of town tomorrow the morning been waiting he said since Friday

## 2013-11-24 ENCOUNTER — Encounter: Payer: Self-pay | Admitting: Cardiology

## 2013-11-24 ENCOUNTER — Ambulatory Visit (INDEPENDENT_AMBULATORY_CARE_PROVIDER_SITE_OTHER): Payer: Medicare Other | Admitting: Cardiology

## 2013-11-24 VITALS — BP 131/76 | HR 64 | Ht 71.25 in | Wt 221.0 lb

## 2013-11-24 DIAGNOSIS — I10 Essential (primary) hypertension: Secondary | ICD-10-CM

## 2013-11-24 DIAGNOSIS — I480 Paroxysmal atrial fibrillation: Secondary | ICD-10-CM

## 2013-11-24 DIAGNOSIS — I4891 Unspecified atrial fibrillation: Secondary | ICD-10-CM

## 2013-11-24 DIAGNOSIS — I119 Hypertensive heart disease without heart failure: Secondary | ICD-10-CM

## 2013-11-24 DIAGNOSIS — Z79899 Other long term (current) drug therapy: Secondary | ICD-10-CM

## 2013-11-24 DIAGNOSIS — G4733 Obstructive sleep apnea (adult) (pediatric): Secondary | ICD-10-CM

## 2013-11-24 LAB — BASIC METABOLIC PANEL
BUN: 14 mg/dL (ref 6–23)
CALCIUM: 9 mg/dL (ref 8.4–10.5)
CO2: 29 mEq/L (ref 19–32)
Chloride: 104 mEq/L (ref 96–112)
Creatinine, Ser: 0.8 mg/dL (ref 0.4–1.5)
GFR: 98.02 mL/min (ref >60.00)
GLUCOSE: 88 mg/dL (ref 70–99)
Potassium: 3.9 mEq/L (ref 3.5–5.1)
Sodium: 141 mEq/L (ref 135–145)

## 2013-11-24 LAB — MAGNESIUM: Magnesium: 2.1 mg/dL (ref 1.5–2.5)

## 2013-11-24 NOTE — Assessment & Plan Note (Signed)
He has felt much better since being diagnosed with obstructive sleep apnea and using a CPAP machine.  He has more energy.

## 2013-11-24 NOTE — Patient Instructions (Signed)
Will obtain labs today and call you with the results (bmet/magnesium)  Your physician recommends that you continue on your current medications as directed. Please refer to the Current Medication list given to you today.  Your physician recommends that you schedule a follow-up appointment in: 3 month ov/ekg/bmet/magnesium

## 2013-11-24 NOTE — Assessment & Plan Note (Signed)
He brought in a list of his daily blood pressures which generally has been in the 132/70 range.  He is not having a dizziness or syncope

## 2013-11-24 NOTE — Assessment & Plan Note (Signed)
The patient has occasional short episodes of atrial fibrillation.  He brought in a list of her often these are recurring.  They are occurring at intervals of about every 15-24 days.  The last 6-7 hours and occasionally up to 24 hours.  He takes extra beta blocker and rests and then goes back into rhythm.

## 2013-11-24 NOTE — Progress Notes (Signed)
Connor [name] Date of Birth:  [birth year] [place] Connor [name], Connor [name]  [address] [phone]        Fax   [phone]   History of Present Illness: This pleasant 80 year old gentleman is seen for a scheduled followup office visit. He has a history of recurrent atrial fibrillation. He has had previous ablation procedure by Dr. Rayann Heman for atrial flutter. He is on long-term Xarelto for chronic anticoagulation. He has a history of high blood pressure and hypercholesterolemia. He has had a previous deep vein thrombosis in the setting of right hip surgery. He has less energy and has been more short of breath whenever he is not in sinus rhythm. The patient is now on Tikosyn. He converted after initial doses and has been on Tikosyn. He has been tolerating the Tikosyn well. The patient was hospitalized briefly on 10/27/12 after he fell and landed on his right chest. He was concerned that he might have fractured a rib and [facility] where he was noted to be in atrial fibrillation with rapid ventricular response. He was transferred to [facility] and the following day underwent direct-current cardioversion successfully and was discharged home later that same day. The patient has had 2 brief occurrences of atrial fibrillation in October most recently on 02/27/13 when he was out of rhythm for about 4 hours. Today he brought in a computerized compendium of each episode of atrial fib and how long it lasts. He takes extra metoprolol to help bring it back to sinus rhythm.   Current Outpatient Prescriptions  Medication Sig Dispense Refill  . atorvastatin (LIPITOR) 10 MG tablet Take 1 tablet (10 mg total) by mouth daily.  30 tablet  5  . Cholecalciferol (VITAMIN D3) 5000 UNITS TABS Take 5,000 Units by mouth daily.        Mariane Baumgarten Calcium (STOOL SOFTENER PO) Take 1 capsule by mouth every 3 (three) days.       . methocarbamol (ROBAXIN) 500 MG tablet  Take 1 tablet (500 mg total) by mouth at bedtime as needed (pain).  30 tablet  3  . metoprolol succinate (TOPROL-XL) 50 MG 24 hr tablet TAKE 1 TABLET IN THE MORNING AND 1/2 IN THE EVENING  135 tablet  4  . metoprolol tartrate (LOPRESSOR) 25 MG tablet TAKE 1 TABLET EVERY 6 HOURS AS NEEDED FOR FAST HEART RATES  30 tablet  0  . Multiple Vitamin (MULITIVITAMIN) LIQD Take 5 mLs by mouth daily.      . Multiple Vitamins-Minerals (ICAPS PO) Take 1 tablet by mouth daily.       . Omega-3 Fatty Acids (OMEGA 3 PO) Take 1 capsule by mouth daily.       Marland Kitchen omeprazole (PRILOSEC) 20 MG capsule Take 20 mg by mouth daily.        Marland Kitchen OVER THE COUNTER MEDICATION Place 3 drops into both eyes as needed (dry eyes). "thera tears"      . potassium chloride SA (K-DUR,KLOR-CON) 20 MEQ tablet Take 2 tablets (20meq) by mouth in the morning and 1 tablet (64meq) in the evening.  270 tablet  3  . tamsulosin (FLOMAX) 0.4 MG CAPS capsule TAKE 1 CAPSULE 30 MINUTES AFTER THE SAME MEAL EACH DAY ORALLY ONCE A DAY PER UROLOGIST  30 capsule  6  . TIKOSYN 500 MCG capsule TAKE 1 CAPSULE BY MOUTH EVERY 12 HOURS  60 capsule  1  . TIKOSYN 500 MCG capsule TAKE  1 CAPSULE BY MOUTH EVERY 12 HOURS  60 capsule  4  . traMADol (ULTRAM) 50 MG tablet Take 1 tablet (50 mg total) by mouth every 6 (six) hours as needed. For pain  60 tablet  2  . XARELTO 20 MG TABS tablet TAKE 1 TABLET EVERY DAY  30 tablet  7   Current Facility-Administered Medications  Medication Dose Route Frequency Provider Last Rate Last Dose  . pneumococcal 13-valent conjugate vaccine (PREVNAR 13) injection 0.5 mL  0.5 mL Intramuscular Tomorrow-1000 Elayne Snare, MD        Allergies  Allergen Reactions  . Penicillins Itching and Swelling    Patient Active Problem List   Diagnosis Date Noted  . Atrial flutter     Priority: High  . Hypertension 08/02/2010    Priority: Medium  . OSA (obstructive sleep apnea) 04/07/2013  . Multinodular goiter 01/20/2013  . Cervical spondylosis  without myelopathy 01/18/2013  . Atrial fibrillation with RVR 10/27/2012  . PAF (paroxysmal atrial fibrillation) 05/28/2012  . Left ventricular dysfunction 12/17/2011  . Low back pain 09/11/2011  . Fatigue 05/22/2011  . PVC (premature ventricular contraction) 01/18/2011  . Persistent atrial fibrillation 01/12/2011  . Hyperlipidemia 08/02/2010  . Osteoarthritis 08/02/2010  . BPH (benign prostatic hyperplasia) 08/02/2010  . GERD (gastroesophageal reflux disease) 08/02/2010    History  Smoking status  . Former Smoker -- 1.00 packs/day for 30 years  . Types: Cigarettes  . Quit date: 05/06/1978  Smokeless tobacco  . Never Used    History  Alcohol Use  . 3.0 oz/week  . 5 Cans of beer per week    Family History  Problem Relation Age of Onset  . Heart attack Father   . Lung disease Mother   . Atrial fibrillation Brother   . Arrhythmia Sister     Review of Systems: Constitutional: no fever chills diaphoresis or fatigue or change in weight.  Head and neck: no hearing loss, no epistaxis, no photophobia or visual disturbance. Respiratory: No cough, shortness of breath or wheezing. Cardiovascular: No chest pain peripheral edema, palpitations. Gastrointestinal: No abdominal distention, no abdominal pain, no change in bowel habits hematochezia or melena. Genitourinary: No dysuria, no frequency, no urgency, no nocturia. Musculoskeletal:No arthralgias, no back pain, no gait disturbance or myalgias. Neurological: No dizziness, no headaches, no numbness, no seizures, no syncope, no weakness, no tremors. Hematologic: No lymphadenopathy, no easy bruising. Psychiatric: No confusion, no hallucinations, no sleep disturbance.    Physical Exam: Filed Vitals:   11/24/13 1007  BP: 131/76  Pulse: 64   the general appearance reveals a well-developed well-nourished gentleman in no distress.  He wears hearing aids.The head and neck exam reveals pupils equal and reactive.  Extraocular  movements are full.  There is no scleral icterus.  The mouth and pharynx are normal.  The neck is supple.  The carotids reveal no bruits.  The jugular venous pressure is normal.  The  thyroid is not enlarged.  There is no lymphadenopathy.  The chest is clear to percussion and auscultation.  There are no rales or rhonchi.  Expansion of the chest is symmetrical.  The precordium is quiet.  The first heart sound is normal.  The second heart sound is physiologically split.  There is no murmur gallop rub or click.  There is no abnormal lift or heave.  The abdomen is soft and nontender.  The bowel sounds are normal.  The liver and spleen are not enlarged.  There are no abdominal masses.  There are no abdominal bruits.  Extremities reveal good pedal pulses.  There is no phlebitis or edema.  There is no cyanosis or clubbing.  Strength is normal and symmetrical in all extremities.  There is no lateralizing weakness.  There are no sensory deficits.  The skin is warm and dry.  There is no rash.  EKG shows normal sinus rhythm with frequent PVCs.  He has a right bundle branch block.  His QTc interval is 509 ms.   Assessment / Plan: 1. paroxysmal atrial fibrillation, status post previous ablation procedure for atrial flutter 2. essential hypertension without heart failure. 3. Hypercholesterolemia 4. Osteoarthritis 5. obstructive sleep apnea 6. right bundle branch block  Disposition: Continue same dose of medication including Tikosyn.  We are checking electrolytes and magnesium today.  Recheck in 3 months for followup office visit EKG basal metabolic panel and magnesium. His next visit will be after their planned trip out to [place] and [place] in September. The patient has been busy in his garden and brought in some nice vegetables today.

## 2013-11-25 ENCOUNTER — Telehealth: Payer: Self-pay | Admitting: *Deleted

## 2013-11-25 DIAGNOSIS — E876 Hypokalemia: Secondary | ICD-10-CM

## 2013-11-25 MED ORDER — POTASSIUM CHLORIDE CRYS ER 20 MEQ PO TBCR: EXTENDED_RELEASE_TABLET | ORAL | Status: DC

## 2013-11-25 NOTE — Telephone Encounter (Signed)
Advised patient of lab results and change in medication.

## 2013-11-25 NOTE — Telephone Encounter (Signed)
Message copied by Earvin Hansen on Thu Nov 25, 2013  5:55 PM ------      Message from: Darlin Coco      Created: Wed Nov 24, 2013  7:20 PM       Please report.  The serum magnesium is normal.  The potassium is slightly low for Tikosyn at 3.9.      Increase potassium to 4 tablets a day (a total of 80 mEq daily) ------

## 2013-12-19 ENCOUNTER — Other Ambulatory Visit: Payer: Self-pay | Admitting: Cardiology

## 2013-12-19 DIAGNOSIS — N400 Enlarged prostate without lower urinary tract symptoms: Secondary | ICD-10-CM

## 2013-12-20 ENCOUNTER — Other Ambulatory Visit: Payer: Self-pay | Admitting: *Deleted

## 2013-12-20 MED ORDER — METHOCARBAMOL 500 MG PO TABS: 500.0000 mg | ORAL_TABLET | Freq: Every evening | ORAL | Status: DC | PRN

## 2013-12-20 MED ORDER — TRAMADOL HCL 50 MG PO TABS: 50.0000 mg | ORAL_TABLET | Freq: Four times a day (QID) | ORAL | Status: DC | PRN

## 2013-12-28 ENCOUNTER — Telehealth: Payer: Self-pay | Admitting: Cardiology

## 2013-12-28 DIAGNOSIS — Z79899 Other long term (current) drug therapy: Secondary | ICD-10-CM

## 2013-12-28 NOTE — Telephone Encounter (Signed)
Left message to call back  

## 2013-12-28 NOTE — Telephone Encounter (Signed)
New message      Talk to [facility] regarding his potassium

## 2013-12-29 NOTE — Telephone Encounter (Signed)
Follow up ° ° ° °Returned Connor [name]'s call °

## 2013-12-29 NOTE — Telephone Encounter (Signed)
Left message to call back  

## 2013-12-30 ENCOUNTER — Other Ambulatory Visit (INDEPENDENT_AMBULATORY_CARE_PROVIDER_SITE_OTHER): Payer: Medicare Other

## 2013-12-30 DIAGNOSIS — Z79899 Other long term (current) drug therapy: Secondary | ICD-10-CM

## 2013-12-30 LAB — BASIC METABOLIC PANEL
BUN: 15 mg/dL (ref 6–23)
CO2: 28 mEq/L (ref 19–32)
CREATININE: 0.9 mg/dL (ref 0.4–1.5)
Calcium: 8.9 mg/dL (ref 8.4–10.5)
Chloride: 106 mEq/L (ref 96–112)
GFR: 92.7 mL/min (ref >60.00)
Glucose, Bld: 110 mg/dL — ABNORMAL HIGH (ref 70–99)
POTASSIUM: 4.3 meq/L (ref 3.5–5.1)
Sodium: 140 mEq/L (ref 135–145)

## 2013-12-30 NOTE — Addendum Note (Signed)
Addended by: Alvina Filbert B on: 12/30/2013 10:58 AM   Modules accepted: Orders

## 2013-12-30 NOTE — Progress Notes (Signed)
Quick Note:  Please report to patient. The recent labs are stable. Continue same medication and careful diet. BS higher. Watch sweets and carbs ______

## 2013-12-30 NOTE — Telephone Encounter (Signed)
Follow up     Talk to Melinda----pt left a different number for you to call him

## 2013-12-30 NOTE — Telephone Encounter (Signed)
Patient is concerned about the increase in his K+ after last labs. Since then he has 3 episodes of Afib lasting 36 hours which is unusual for him (normally lasts a few hours). Patient not currently in Afib, and is taking Xarelto. Discussed with  Dr. Mare Ferrari and will have patient come today for labs, advised patient.

## 2014-01-11 ENCOUNTER — Other Ambulatory Visit: Payer: Self-pay | Admitting: *Deleted

## 2014-01-11 MED ORDER — ATORVASTATIN CALCIUM 10 MG PO TABS: 10.0000 mg | ORAL_TABLET | Freq: Every day | ORAL | Status: DC

## 2014-02-10 ENCOUNTER — Other Ambulatory Visit: Payer: Self-pay | Admitting: Cardiology

## 2014-02-21 ENCOUNTER — Ambulatory Visit (INDEPENDENT_AMBULATORY_CARE_PROVIDER_SITE_OTHER): Payer: Medicare Other | Admitting: Cardiology

## 2014-02-21 ENCOUNTER — Encounter: Payer: Self-pay | Admitting: Cardiology

## 2014-02-21 ENCOUNTER — Other Ambulatory Visit: Payer: Medicare Other | Admitting: *Deleted

## 2014-02-21 VITALS — BP 124/64 | HR 70 | Ht 71.0 in | Wt 224.0 lb

## 2014-02-21 DIAGNOSIS — Z79899 Other long term (current) drug therapy: Secondary | ICD-10-CM

## 2014-02-21 DIAGNOSIS — E785 Hyperlipidemia, unspecified: Secondary | ICD-10-CM

## 2014-02-21 DIAGNOSIS — G4733 Obstructive sleep apnea (adult) (pediatric): Secondary | ICD-10-CM

## 2014-02-21 DIAGNOSIS — E876 Hypokalemia: Secondary | ICD-10-CM

## 2014-02-21 DIAGNOSIS — I119 Hypertensive heart disease without heart failure: Secondary | ICD-10-CM

## 2014-02-21 DIAGNOSIS — I480 Paroxysmal atrial fibrillation: Secondary | ICD-10-CM

## 2014-02-21 LAB — BASIC METABOLIC PANEL
BUN: 15 mg/dL (ref 6–23)
CO2: 25 mEq/L (ref 19–32)
Calcium: 9 mg/dL (ref 8.4–10.5)
Chloride: 104 mEq/L (ref 96–112)
Creatinine, Ser: 0.8 mg/dL (ref 0.4–1.5)
GFR: 96.58 mL/min (ref >60.00)
Glucose, Bld: 98 mg/dL (ref 70–99)
POTASSIUM: 4.3 meq/L (ref 3.5–5.1)
SODIUM: 142 meq/L (ref 135–145)

## 2014-02-21 LAB — MAGNESIUM: Magnesium: 2.1 mg/dL (ref 1.5–2.5)

## 2014-02-21 NOTE — Progress Notes (Signed)
[name] Date of Birth:  Dec 30, 1933 [address], [place]  [id number] [phone]        Fax   [phone]   History of Present Illness: This pleasant 80 year old gentleman is seen for a scheduled followup office visit. He has a history of recurrent atrial fibrillation. He has had previous ablation procedure by Dr. Rayann Heman for atrial flutter. He is on long-term Xarelto for chronic anticoagulation. He has a history of high blood pressure and hypercholesterolemia. He has had a previous deep vein thrombosis in the setting of right hip surgery. He has less energy and has been more short of breath whenever he is not in sinus rhythm. The patient is now on Tikosyn. He converted after initial doses and has been on Tikosyn. He has been tolerating the Tikosyn well. The patient was hospitalized briefly on 10/27/12 after he fell and landed on his right chest. He was concerned that he might have fractured a rib and went [facility] where he was noted to be in atrial fibrillation with rapid ventricular response. He was transferred to [facility] and the following day underwent direct-current cardioversion successfully and was discharged home later that same day.  The patient brought in a diary of his episodes when he is in atrial fibrillation.  Typically he will stay out of rhythm for about 12-18 hours and then go back into rhythm.  He takes extra metoprolol on a when necessary basis. The patient has a history of obstructive sleep apnea and uses a CPAP machine 5-7 hours per night.   Current Outpatient Prescriptions  Medication Sig Dispense Refill  . atorvastatin (LIPITOR) 10 MG tablet Take 1 tablet (10 mg total) by mouth daily.  30 tablet  5  . Cholecalciferol (VITAMIN D3) 5000 UNITS TABS Take 5,000 Units by mouth daily.        Mariane Baumgarten Calcium (STOOL SOFTENER PO) Take 1 capsule by mouth every 3 (three) days.       . methocarbamol  (ROBAXIN) 500 MG tablet Take 1 tablet (500 mg total) by mouth at bedtime as needed (pain).  30 tablet  3  . metoprolol succinate (TOPROL-XL) 50 MG 24 hr tablet TAKE 1 TABLET IN THE MORNING AND 1/2 IN THE EVENING  135 tablet  4  . metoprolol tartrate (LOPRESSOR) 25 MG tablet TAKE 1 TABLET EVERY 6 HOURS AS NEEDED FOR FAST HEART RATES  30 tablet  11  . Multiple Vitamin (MULITIVITAMIN) LIQD Take 5 mLs by mouth daily.      . Multiple Vitamins-Minerals (ICAPS PO) Take 1 tablet by mouth daily.       . Omega-3 Fatty Acids (OMEGA 3 PO) Take 1 capsule by mouth daily.       Marland Kitchen omeprazole (PRILOSEC) 20 MG capsule Take 20 mg by mouth daily.        Marland Kitchen OVER THE COUNTER MEDICATION Place 3 drops into both eyes as needed (dry eyes). "thera tears"      . potassium chloride SA (K-DUR,KLOR-CON) 20 MEQ tablet Take 2 tablets (18meq) by mouth in the morning and in the evening.  360 tablet  3  . tamsulosin (FLOMAX) 0.4 MG CAPS capsule TAKE ONE CAPSULE BY MOUTH 30 MINUTES AFTER SAME MEAL EACH DAY  30 capsule  6  . TIKOSYN 500 MCG capsule TAKE 1 CAPSULE BY MOUTH EVERY 12 HOURS  60 capsule  1  . TIKOSYN 500 MCG capsule TAKE 1 CAPSULE  BY MOUTH EVERY 12 HOURS  60 capsule  4  . traMADol (ULTRAM) 50 MG tablet Take 1 tablet (50 mg total) by mouth every 6 (six) hours as needed. For pain  60 tablet  2  . XARELTO 20 MG TABS tablet TAKE 1 TABLET EVERY DAY  30 tablet  7   Current Facility-Administered Medications  Medication Dose Route Frequency Provider Last Rate Last Dose  . pneumococcal 13-valent conjugate vaccine (PREVNAR 13) injection 0.5 mL  0.5 mL Intramuscular Tomorrow-1000 Elayne Snare, MD        Allergies  Allergen Reactions  . Penicillins Itching and Swelling    Patient Active Problem List   Diagnosis Date Noted  . Atrial flutter     Priority: High  . Hypertension 08/02/2010    Priority: Medium  . OSA (obstructive sleep apnea) 04/07/2013  . Multinodular goiter 01/20/2013  . Cervical spondylosis without myelopathy  01/18/2013  . Atrial fibrillation with RVR 10/27/2012  . PAF (paroxysmal atrial fibrillation) 05/28/2012  . Left ventricular dysfunction 12/17/2011  . Low back pain 09/11/2011  . Fatigue 05/22/2011  . PVC (premature ventricular contraction) 01/18/2011  . Persistent atrial fibrillation 01/12/2011  . Hyperlipidemia 08/02/2010  . Osteoarthritis 08/02/2010  . BPH (benign prostatic hyperplasia) 08/02/2010  . GERD (gastroesophageal reflux disease) 08/02/2010    History  Smoking status  . Former Smoker -- 1.00 packs/day for 30 years  . Types: Cigarettes  . Quit date: 05/06/1978  Smokeless tobacco  . Never Used    History  Alcohol Use  . 3.0 oz/week  . 5 Cans of beer per week    Family History  Problem Relation Age of Onset  . Heart attack Father   . Lung disease Mother   . Atrial fibrillation Brother   . Arrhythmia Sister     Review of Systems: Constitutional: no fever chills diaphoresis or fatigue or change in weight.  Head and neck: no hearing loss, no epistaxis, no photophobia or visual disturbance. Respiratory: No cough, shortness of breath or wheezing. Cardiovascular: No chest pain peripheral edema, palpitations. Gastrointestinal: No abdominal distention, no abdominal pain, no change in bowel habits hematochezia or melena. Genitourinary: No dysuria, no frequency, no urgency, no nocturia. Musculoskeletal:No arthralgias, no back pain, no gait disturbance or myalgias. Neurological: No dizziness, no headaches, no numbness, no seizures, no syncope, no weakness, no tremors. Hematologic: No lymphadenopathy, no easy bruising. Psychiatric: No confusion, no hallucinations, no sleep disturbance.    Physical Exam: Filed Vitals:   02/21/14 1047  BP: 124/64  Pulse: 70   the general appearance reveals a well-developed well-nourished gentleman in no distress.  He wears hearing aids.The head and neck exam reveals pupils equal and reactive.  Extraocular movements are full.  There  is no scleral icterus.  The mouth and pharynx are normal.  The neck is supple.  The carotids reveal no bruits.  The jugular venous pressure is normal.  The  thyroid is not enlarged.  There is no lymphadenopathy.  The chest is clear to percussion and auscultation.  There are no rales or rhonchi.  Expansion of the chest is symmetrical.  The precordium is quiet.  The first heart sound is normal.  The second heart sound is physiologically split.  There is no murmur gallop rub or click.  There is no abnormal lift or heave.  The abdomen is soft and nontender.  The bowel sounds are normal.  The liver and spleen are not enlarged.  There are no abdominal masses.  There are  no abdominal bruits.  Extremities reveal good pedal pulses.  There is no phlebitis or edema.  There is no cyanosis or clubbing.  Strength is normal and symmetrical in all extremities.  There is no lateralizing weakness.  There are no sensory deficits.  The skin is warm and dry.  There is no rash.  EKG shows normal sinus rhythm with frequent PVCs.  He has a right bundle branch block.  His QTc interval is 509 ms.   Assessment / Plan: 1. paroxysmal atrial fibrillation, status post previous ablation procedure for atrial flutter 2. essential hypertension without heart failure. 3. Hypercholesterolemia 4. Osteoarthritis 5. obstructive sleep apnea 6. right bundle branch block  Disposition: Continue same dose of medication including Tikosyn.  We are checking electrolytes and magnesium today.  Recheck in 3 months for followup office visit EKG basal metabolic panel and magnesium. Since we last saw him they had a wonderful trip to [place] and [place] in September.

## 2014-02-21 NOTE — Assessment & Plan Note (Signed)
The patient is not having any side effects from his Lipitor

## 2014-02-21 NOTE — Assessment & Plan Note (Signed)
Overall the patient feels more rested now that he is using his CPAP machine

## 2014-02-21 NOTE — Progress Notes (Signed)
Quick Note:  Please report to patient. The recent labs are stable. Continue same medication and careful diet. ______ 

## 2014-02-21 NOTE — Assessment & Plan Note (Signed)
His episodes of arrhythmia occur every 3-4 weeks.  Otherwise he is staying in normal sinus rhythm on Tikosyn.  We're checking electrolytes today.

## 2014-02-21 NOTE — Patient Instructions (Addendum)
Will obtain labs today and call you with the results (BMET/MAG)  Your physician recommends that you continue on your current medications as directed. Please refer to the Current Medication list given to you today.  Your physician wants you to follow-up in: 3 MONTHS OV/BMET/MAGNESIUM/EKG You will receive a reminder letter in the mail two months in advance. If you don't receive a letter, please call our office to schedule the follow-up appointment.

## 2014-02-28 ENCOUNTER — Other Ambulatory Visit: Payer: Self-pay | Admitting: Cardiology

## 2014-03-02 ENCOUNTER — Other Ambulatory Visit: Payer: Self-pay | Admitting: Cardiology

## 2014-03-07 ENCOUNTER — Other Ambulatory Visit: Payer: Self-pay | Admitting: *Deleted

## 2014-03-07 MED ORDER — TRAMADOL HCL 50 MG PO TABS: 50.0000 mg | ORAL_TABLET | Freq: Four times a day (QID) | ORAL | Status: DC | PRN

## 2014-03-21 ENCOUNTER — Encounter: Payer: Self-pay | Admitting: Pulmonary Disease

## 2014-03-21 ENCOUNTER — Ambulatory Visit (INDEPENDENT_AMBULATORY_CARE_PROVIDER_SITE_OTHER): Payer: Medicare Other | Admitting: Pulmonary Disease

## 2014-03-21 VITALS — BP 116/78 | HR 78 | Ht 72.0 in | Wt 234.8 lb

## 2014-03-21 DIAGNOSIS — G4733 Obstructive sleep apnea (adult) (pediatric): Secondary | ICD-10-CM

## 2014-03-21 NOTE — Assessment & Plan Note (Signed)
Your CPAP is set at 11 cm Call us as needed Supplies will be renewed x 1 year Can try nasal pillows in the future  Weight loss encouraged, compliance with goal of at least 4-6 hrs every night is the expectation. Advised against medications with sedative side effects Cautioned against driving when sleepy - understanding that sleepiness will vary on a day to day basis

## 2014-03-21 NOTE — Patient Instructions (Signed)
Your CPAP is set at 11 cm Call us as needed Supplies will be renewed x 1 year Can try nasal pillows in the future

## 2014-03-21 NOTE — Progress Notes (Signed)
   Subjective:    Patient ID: Connor [name], male    DOB: 15-Dec-1933, 80 y.o.   MRN: [id number]  HPI   80 year old with recurrent atrial fibrillation presents for FU of obstructive sleep apnea.  He had ablation procedure by Dr. Rayann Heman for atrial flutter and underwent cardioversion in June 2014. He continues to have atrial fibrillation. He is on Xarelto . He has a history of high blood pressure and hypercholesterolemia. He had a DVT after right hip replacement in feb 2012 with resolution on duplex in 01/2012.  Echo - nml LV fn  He reports nasal injury as a child and chronic nasal blockage which is helped by using Breathe Right strips.   PSG 05/2013 Moderate OSA with AHI 21/h 07/2013 CPAP of 11 centimeters with a large full face mask Download -residual AHI 5/h on 11 cm, no leak, good usage 6h   03/21/2014  Chief Complaint  Patient presents with  . Follow-up    f/u OSA; wears CPAP every night approx 5-6 hours nightly at 11cmH20; cough, chest congestion; right eye redness   Download 03/2014 on 11 cm >> residual AHI 4/h, good usage , avg 5.5 h, no leak  Tried nasal mask & pillows but did not persist back to FF mask Pr ok, no dryness   Review of Systems neg for any significant sore throat, dysphagia, itching, sneezing, nasal congestion or excess/ purulent secretions, fever, chills, sweats, unintended wt loss, pleuritic or exertional cp, hempoptysis, orthopnea pnd or change in chronic leg swelling. Also denies presyncope, palpitations, heartburn, abdominal pain, nausea, vomiting, diarrhea or change in bowel or urinary habits, dysuria,hematuria, rash, arthralgias, visual complaints, headache, numbness weakness or ataxia.     Objective:   Physical Exam  Gen. Pleasant, well-nourished, in no distress ENT - no lesions, no post nasal drip Neck: No JVD, no thyromegaly, no carotid bruits Lungs: no use of accessory muscles, no dullness to percussion, clear without rales or rhonchi    Cardiovascular: Rhythm regular, heart sounds  normal, no murmurs or gallops, no peripheral edema Musculoskeletal: No deformities, no cyanosis or clubbing         Assessment & Plan:

## 2014-03-23 ENCOUNTER — Telehealth: Payer: Self-pay | Admitting: Cardiology

## 2014-03-23 ENCOUNTER — Encounter: Payer: Self-pay | Admitting: Cardiology

## 2014-03-23 ENCOUNTER — Other Ambulatory Visit: Payer: Self-pay

## 2014-03-23 MED ORDER — DOFETILIDE 500 MCG PO CAPS: ORAL_CAPSULE | ORAL | Status: DC

## 2014-03-23 NOTE — Telephone Encounter (Signed)
New message    Patient calling wants prescription changes to one month supply to 3 months supply - Tikosyn.

## 2014-03-28 ENCOUNTER — Other Ambulatory Visit: Payer: Self-pay | Admitting: *Deleted

## 2014-03-28 DIAGNOSIS — I480 Paroxysmal atrial fibrillation: Secondary | ICD-10-CM

## 2014-03-28 NOTE — Telephone Encounter (Signed)
F/U  Patient returning call. Please contact at [phone]

## 2014-03-29 MED ORDER — DOFETILIDE 500 MCG PO CAPS: ORAL_CAPSULE | ORAL | Status: DC

## 2014-04-14 ENCOUNTER — Encounter (HOSPITAL_COMMUNITY): Payer: Self-pay | Admitting: Cardiology

## 2014-04-25 ENCOUNTER — Other Ambulatory Visit: Payer: Self-pay | Admitting: *Deleted

## 2014-04-25 MED ORDER — METHOCARBAMOL 500 MG PO TABS: 500.0000 mg | ORAL_TABLET | Freq: Every evening | ORAL | Status: DC | PRN

## 2014-05-18 ENCOUNTER — Encounter: Payer: Self-pay | Admitting: Cardiology

## 2014-05-18 ENCOUNTER — Encounter: Payer: Self-pay | Admitting: Nurse Practitioner

## 2014-05-18 ENCOUNTER — Ambulatory Visit (INDEPENDENT_AMBULATORY_CARE_PROVIDER_SITE_OTHER): Payer: Medicare Other | Admitting: Nurse Practitioner

## 2014-05-18 VITALS — BP 110/80 | HR 63 | Ht 71.0 in | Wt 224.8 lb

## 2014-05-18 DIAGNOSIS — I4891 Unspecified atrial fibrillation: Secondary | ICD-10-CM

## 2014-05-18 DIAGNOSIS — I480 Paroxysmal atrial fibrillation: Secondary | ICD-10-CM | POA: Diagnosis not present

## 2014-05-18 LAB — BASIC METABOLIC PANEL
BUN: 18 mg/dL (ref 6–23)
CALCIUM: 9.2 mg/dL (ref 8.4–10.5)
CO2: 33 meq/L — AB (ref 19–32)
CREATININE: 0.96 mg/dL (ref 0.40–1.50)
Chloride: 105 mEq/L (ref 96–112)
GFR: 80.47 mL/min (ref >60.00)
GLUCOSE: 136 mg/dL — AB (ref 70–99)
Potassium: 4.5 mEq/L (ref 3.5–5.1)
Sodium: 141 mEq/L (ref 135–145)

## 2014-05-18 LAB — MAGNESIUM: Magnesium: 2.2 mg/dL (ref 1.5–2.5)

## 2014-05-18 NOTE — Patient Instructions (Addendum)
Labs today: BMET, Magnesium  Please keep appointment on Wednesday 05/25/14 at 8:00 a.m. with Dr. Rayann Heman  Your appointment with Dr. Mare Ferrari has been rescheduled to 06/07/14 at 10:45 a.m.  Thank you for choosing [facility]!!

## 2014-05-18 NOTE — Progress Notes (Signed)
Primary Cardiologist:  Dr Mare Ferrari  PCP:  Elayne Snare, MD  The patient presents today for urgent electrophysiology evaluation for persistent afib since Monday. H/o aflutter ablation 5/12 and tikosyn loading 12/31/11. Pt typically will have a breakthrough episode every few weeks, but typically lasts around 12 hours before he returns to [place]. He will use metoprolol tartrate every 8 hours while in afib, in addition to his tikosyn and long acting metoprolol. He does not know of any specific trigger.  Follows his heart rate on his phone app and is very weak with episodes.  Ekg now shows SR at 63 bpm. He was surprised he was back in rhythm. He would like to discuss with Dr. Rayann Heman options to help stay in SR with less afib burden. Dr. Jackalyn Lombard note on last visit(01/13/12), suggested another ablation vrs amiodarone. Continues on xarelto without bleeding issues. Wearing Cpap consistently. No alcohol use.   Today, he denies symptoms of  chest pain, shortness of breath, orthopnea, PND, lower extremity edema, dizziness, presyncope, syncope, or neurologic sequela. Weakness with afib episodes. The patient feels that he is tolerating medications without difficulties and is otherwise without complaint today.   Past Medical History  Diagnosis Date  . Hypertension   . Atrial flutter     s/p ablation in March 2012  . Hyperlipidemia   . Vitamin D deficiency   . RBBB (right bundle branch block)   . BPH (benign prostatic hyperplasia)   . Osteoarthritis   . Anemia   . Peptic ulcer disease   . DVT (deep venous thrombosis) 2/12    in setting of R hip surgery, he developed R femoral DVT  . Chronic anticoagulation     on Xarelto for afib  . Abnormal nuclear cardiac imaging test 1/13    Abnormal stress test 05/2011, but cardiac cath 06/2011 showing normal coronary anatomy.  . Paroxysmal atrial fibrillation 9/12    a. documented by Dr Barrett Shell on office EKG 9/12. b. maintained on Tikosyn and Xarelto. c. s/p DCCV 10/2012.  Marland Kitchen  Premature ventricular contraction    Past Surgical History  Procedure Laterality Date  . Total hip arthroplasty  06/04/10    RIGHT HIP  . Total knee arthroplasty    . Transurethral resection of prostate    . Atrial flutter ablation  5/12    by JA  . Cardiac catheterization    . Cardioversion N/A 10/28/2012    Procedure: CARDIOVERSION;  Surgeon: Burnell Blanks, MD;  Location: [facility];  Service: Cardiovascular;  Laterality: N/A;  . Left heart catheterization with coronary angiogram N/A 06/07/2011    Procedure: LEFT HEART CATHETERIZATION WITH CORONARY ANGIOGRAM;  Surgeon: Peter M Martinique, MD;  Location: [facility] CATH LAB;  Service: Cardiovascular;  Laterality: N/A;    Current Outpatient Prescriptions  Medication Sig Dispense Refill  . atorvastatin (LIPITOR) 10 MG tablet Take 1 tablet (10 mg total) by mouth daily. 30 tablet 5  . Cholecalciferol (VITAMIN D3) 5000 UNITS TABS Take 5,000 Units by mouth daily.      Mariane Baumgarten Calcium (STOOL SOFTENER PO) Take 1 capsule by mouth every 3 (three) days.     Marland Kitchen dofetilide (TIKOSYN) 500 MCG capsule TAKE 1 CAPSULE BY MOUTH EVERY 12 HOURS 180 capsule 3  . methocarbamol (ROBAXIN) 500 MG tablet Take 1 tablet (500 mg total) by mouth at bedtime as needed (pain). 30 tablet 3  . metoprolol succinate (TOPROL-XL) 50 MG 24 hr tablet TAKE 1 TABLET IN THE MORNING AND 1/2 IN THE EVENING 135 tablet  1  . metoprolol tartrate (LOPRESSOR) 25 MG tablet TAKE 1 TABLET EVERY 6 HOURS AS NEEDED FOR FAST HEART RATES 30 tablet 11  . Multiple Vitamin (MULITIVITAMIN) LIQD Take 5 mLs by mouth daily.    . Multiple Vitamins-Minerals (ICAPS PO) Take 1 tablet by mouth daily.     . Omega-3 Fatty Acids (OMEGA 3 PO) Take 1 capsule by mouth daily.     Marland Kitchen omeprazole (PRILOSEC) 20 MG capsule Take 20 mg by mouth daily.      Marland Kitchen OVER THE COUNTER MEDICATION Place 3 drops into both eyes as needed (dry eyes). "thera tears"    . potassium chloride SA (K-DUR,KLOR-CON) 20 MEQ tablet Take 2 tablets (64meq)  by mouth in the morning and in the evening. 360 tablet 3  . tamsulosin (FLOMAX) 0.4 MG CAPS capsule TAKE ONE CAPSULE BY MOUTH 30 MINUTES AFTER SAME MEAL EACH DAY 30 capsule 6  . traMADol (ULTRAM) 50 MG tablet Take 1 tablet (50 mg total) by mouth every 6 (six) hours as needed. For pain 60 tablet 2  . XARELTO 20 MG TABS tablet TAKE 1 TABLET BY MOUTH EVERY DAY 30 tablet 7   Current Facility-Administered Medications  Medication Dose Route Frequency Provider Last Rate Last Dose  . pneumococcal 13-valent conjugate vaccine (PREVNAR 13) injection 0.5 mL  0.5 mL Intramuscular Tomorrow-1000 Elayne Snare, MD        Allergies  Allergen Reactions  . Penicillins Itching and Swelling    History   Social History  . Marital Status: Married    Spouse Name: N/A    Number of Children: N/A  . Years of Education: N/A   Occupational History  . Not on file.   Social History Main Topics  . Smoking status: Former Smoker -- 1.00 packs/day for 30 years    Types: Cigarettes    Quit date: 05/06/1978  . Smokeless tobacco: Never Used  . Alcohol Use: 3.0 oz/week    5 Cans of beer per week  . Drug Use: No  . Sexual Activity: Not on file   Other Topics Concern  . Not on file   Social History Narrative   he patient lives in [place] with his spouse    Family History  Problem Relation Age of Onset  . Heart attack Father   . Lung disease Mother   . Atrial fibrillation Brother   . Arrhythmia Sister    Physical Exam: Filed Vitals:   05/18/14 1441  BP: 110/80  Pulse: 63  Height: 5\' 11"  (1.803 m)  Weight: 224 lb 12.8 oz (101.969 kg)    GEN- The patient is well appearing, alert and oriented x 3 today.   Head- normocephalic, atraumatic Eyes-  Sclera clear, conjunctiva pink Ears- hearing intact Oropharynx- clear Neck- supple, no JVP Lymph- no cervical lymphadenopathy Lungs- Clear to ausculation bilaterally, normal work of breathing Heart- Regular rate and rhythm, no murmurs, rubs or gallops,  PMI not laterally displaced GI- soft, NT, ND, + BS Extremities- no clubbing, cyanosis, +1 edema  ekg today reveals sinus rhythm 63 bpm, RBBB,PR 164 ms, QRS 140 ms, QTc 485 ms    Assessment and Plan:  Persistent afib Bmet and Mag today F/u with Dr. Rayann Heman to discuss other options for management of afib 05/25/14 No change in meds Continue xarelto Continue cpap F/u Dr. Mare Ferrari as scheduled 2/2

## 2014-05-18 NOTE — Telephone Encounter (Signed)
Called patient about his concerns. Patient's been in A. Fib since Monday morning. Patient states, "I usually come out of A. Fib by now and convert back." Patient's BP 128/73 and HR 107 at home. Patient is on Tikosyn, Xarelto, and Metoprolol. Patient is compliant with his medications. Patient states that he is weak and cannot do anything but sit around and wait for his rhythm to change. Consulted Dr. Aundra Dubin (DOD), he advised for the patient to see Dr. Mare Ferrari or Dr. Rayann Heman this week. These doctors are not in clinic this week, so he suggested the patient be seen in the A. Fib clinic with Roderic Palau NP. Patient was able to get an appointment this afternoon with A. Fib clinic. Patient verbalized understanding and agreed to plan.

## 2014-05-24 ENCOUNTER — Ambulatory Visit: Payer: Medicare Other | Admitting: Cardiology

## 2014-05-25 ENCOUNTER — Encounter: Payer: Self-pay | Admitting: Internal Medicine

## 2014-05-25 ENCOUNTER — Ambulatory Visit (INDEPENDENT_AMBULATORY_CARE_PROVIDER_SITE_OTHER): Payer: Medicare Other | Admitting: Internal Medicine

## 2014-05-25 VITALS — BP 118/68 | HR 67 | Ht 72.0 in | Wt 236.0 lb

## 2014-05-25 DIAGNOSIS — I4891 Unspecified atrial fibrillation: Secondary | ICD-10-CM

## 2014-05-25 DIAGNOSIS — I483 Typical atrial flutter: Secondary | ICD-10-CM

## 2014-05-25 DIAGNOSIS — G4733 Obstructive sleep apnea (adult) (pediatric): Secondary | ICD-10-CM

## 2014-05-25 DIAGNOSIS — I10 Essential (primary) hypertension: Secondary | ICD-10-CM

## 2014-05-25 NOTE — Patient Instructions (Signed)
Your physician has requested that you have an echocardiogram. Echocardiography is a painless test that uses sound waves to create images of your heart. It provides your doctor with information about the size and shape of your heart and how well your heart's chambers and valves are working. This procedure takes approximately one hour. There are no restrictions for this procedure.  Follow up with Dr. Mare Ferrari as scheduled  Cardiac Ablation  Cardiac ablation is a procedure to stop some heart tissue from causing problems. The heart has many electrical connections. Sometimes these connections cause the heart to beat very fast or irregularly. Removing some of the problem areas can improve heart rhythm or make it normal. Ablation is done for people who:   Have Wolff-Parkinson-White syndrome.  Have other fast heart rhythms (tachycardia).  Have taken medicines for an abnormal heart rhythm (arrhythmia) and the medicines had:  No success.  Side effects.  May have a type of heartbeat that could cause death. BEFORE THE PROCEDURE   Follow instructions from your doctor about eating and drinking before the procedure.  Take your medicines as told by your doctor. Take them at regular times with water unless told differently by your doctor.  If you are taking diabetes medicine, ask your doctor how to take it. Ask if there are any special instructions you should follow. Your doctor may change how much insulin you take the day of the procedure. PROCEDURE   A special type of X-ray will be used. The X-ray helps your doctor see images of your heart during the procedure.  A small cut (incision) will be made in your neck or groin.  An IV tube will be started before the procedure begins.  You will be given a numbing medicine (anesthetic) or a medicine to help you relax (sedative).  The skin on your neck or groin will be numbed.  A needle will be put into a large vein in your neck or groin.  A thin,  flexible tube (catheter) will be put in to reach your heart.  A dye will be put in the tube. The dye will show up on X-rays. It will help your doctor see the area of the heart that needs treatment.  When the heart tissue that is causing problems is found, the tip of the tube will send an electrical current to it. This will stop it from causing problems.  The tube will be taken out.  Pressure will be put on the area where the tube was. This will keep it from bleeding. A bandage will be placed over the area. AFTER THE PROCEDURE  You will be taken to a recovery area. Your blood pressure, heart rate, and breathing will be watched. The area where the tube was will also be watched for bleeding.  You will need to lie still for 4-6 hours. This keeps the area where the tube was from bleeding. Document Released: 12/23/2012 Document Revised: 09/06/2013 Document Reviewed: 12/23/2012 [facility] Patient Information 2015 [place], [place]. This information is not intended to replace advice given to you by your health care provider. Make sure you discuss any questions you have with your health care provider.

## 2014-05-25 NOTE — Progress Notes (Signed)
Electrophysiology Office Note   Date:  05/25/2014   ID:  Connor [name], DOB [birth year], MRN [id number]  PCP:  Elayne Snare, MD  Cardiologist:  Dr Mare Ferrari Primary Electrophysiologist:  [name], MD    Chief Complaint  Patient presents with  . Atrial Fibrillation     History of Present Illness: Connor [name] is a 81 y.o. male who presents today for electrophysiology evaluation.   He has paroxysmal atrial fibrillation.  His afib is increasing in frequency and duration.  Presently he has afib about every 2 weeks, typically lasting between 1 and 3 days.  He has taken [place] since 2013.  He underwent atrial flutter ablation by me in 2012.  He has tried amiodarone previously but doesn't recall problems with this.   He remains very active.  He finds that ETOH is a trigger for AF and has significantly reduced ETOH intake.  He is compliant with CPAP.  During AF he reports symptoms of increased urination, palpitations, and weakness.  His exercise tolerance is much reduced in AF.  He recently presented to the AF clinic with AF Roderic Palau NPs note is reviewed).  He has since returned to sinus rhythm.  Today, he denies symptoms of chest pain, shortness of breath, orthopnea, PND, lower extremity edema, claudication, dizziness, presyncope, syncope, bleeding, or neurologic sequela. The patient is tolerating medications without difficulties and is otherwise without complaint today.    Past Medical History  Diagnosis Date  . Hypertension   . Atrial flutter     s/p ablation in March 2012  . Hyperlipidemia   . Vitamin D deficiency   . RBBB (right bundle branch block)   . BPH (benign prostatic hyperplasia)   . Osteoarthritis   . Anemia   . Peptic ulcer disease   . DVT (deep venous thrombosis) 2/12    in setting of R hip surgery, he developed R femoral DVT  . Chronic anticoagulation     on Xarelto for afib  . Abnormal nuclear cardiac imaging test 1/13    Abnormal stress test 05/2011, but  cardiac cath 06/2011 showing normal coronary anatomy.  . Paroxysmal atrial fibrillation 9/12    a. documented by Dr Barrett Shell on office EKG 9/12. b. maintained on Tikosyn and Xarelto. c. s/p DCCV 10/2012.  Marland Kitchen Premature ventricular contraction   . Obstructive sleep apnea     compliant with CPAP.  managed by Dr Elsworth Soho.   Past Surgical History  Procedure Laterality Date  . Total hip arthroplasty  06/04/10    RIGHT HIP  . Total knee arthroplasty    . Transurethral resection of prostate    . Atrial flutter ablation  5/12    by JA  . Cardiac catheterization    . Cardioversion N/A 10/28/2012    Procedure: CARDIOVERSION;  Surgeon: Burnell Blanks, MD;  Location: [place];  Service: Cardiovascular;  Laterality: N/A;  . Left heart catheterization with coronary angiogram N/A 06/07/2011    Procedure: LEFT HEART CATHETERIZATION WITH CORONARY ANGIOGRAM;  Surgeon: Peter M Martinique, MD;  Location: [facility] CATH LAB;  Service: Cardiovascular;  Laterality: N/A;     Current Outpatient Prescriptions  Medication Sig Dispense Refill  . atorvastatin (LIPITOR) 10 MG tablet Take 1 tablet (10 mg total) by mouth daily. 30 tablet 5  . Cholecalciferol (VITAMIN D3) 5000 UNITS TABS Take 5,000 Units by mouth daily.      Connor [name] Calcium (STOOL SOFTENER PO) Take 1 capsule by mouth daily as needed (constipation).     Marland Kitchen  dofetilide (TIKOSYN) 500 MCG capsule TAKE 1 CAPSULE BY MOUTH EVERY 12 HOURS 180 capsule 3  . methocarbamol (ROBAXIN) 500 MG tablet Take 1 tablet (500 mg total) by mouth at bedtime as needed (pain). 30 tablet 3  . metoprolol succinate (TOPROL-XL) 50 MG 24 hr tablet TAKE 1 TABLET IN THE MORNING AND 1/2 IN THE EVENING (Patient taking differently: TAKE 1 TABLET BY MOUTH IN THE MORNING AND 1/2 TABLET BY MOUTH IN THE EVENING) 135 tablet 1  . metoprolol tartrate (LOPRESSOR) 25 MG tablet TAKE 1 TABLET EVERY 6 HOURS AS NEEDED FOR FAST HEART RATES (Patient taking differently: TAKE 1 TABLET BY MOUTH EVERY 6 HOURS AS NEEDED FOR  FAST HEART RATES) 30 tablet 11  . Multiple Vitamin (MULITIVITAMIN) LIQD Take 5 mLs by mouth daily.    . Multiple Vitamins-Minerals (ICAPS PO) Take 1 tablet by mouth daily.     . Omega-3 Fatty Acids (OMEGA 3 PO) Take 1 capsule by mouth daily.     Marland Kitchen omeprazole (PRILOSEC) 20 MG capsule Take 20 mg by mouth daily.      Marland Kitchen OVER THE COUNTER MEDICATION Place 3 drops into both eyes daily as needed (dry eyes). "thera tears"    . potassium chloride SA (K-DUR,KLOR-CON) 20 MEQ tablet Take 2 tablets (15meq) by mouth in the morning and in the evening. 360 tablet 3  . tamsulosin (FLOMAX) 0.4 MG CAPS capsule TAKE ONE CAPSULE BY MOUTH 30 MINUTES AFTER SAME MEAL EACH DAY 30 capsule 6  . traMADol (ULTRAM) 50 MG tablet Take 1 tablet (50 mg total) by mouth every 6 (six) hours as needed. For pain 60 tablet 2  . XARELTO 20 MG TABS tablet TAKE 1 TABLET BY MOUTH EVERY DAY 30 tablet 7   Current Facility-Administered Medications  Medication Dose Route Frequency Provider Last Rate Last Dose  . pneumococcal 13-valent conjugate vaccine (PREVNAR 13) injection 0.5 mL  0.5 mL Intramuscular Tomorrow-1000 Elayne Snare, MD        Allergies:   Penicillins   Social History:  The patient  reports that he quit smoking about 36 years ago. His smoking use included Cigarettes. He has a 30 pack-year smoking history. He has never used smokeless tobacco. He reports that he drinks about 3.0 oz of alcohol per week. He reports that he does not use illicit drugs.   Family History:  The patient's family history includes Arrhythmia in his sister; Atrial fibrillation in his brother; Heart attack in his father; Lung disease in his mother.    ROS:  Please see the history of present illness.   All other systems are reviewed and negative.    PHYSICAL EXAM: VS:  BP 118/68 mmHg  Pulse 67  Ht 6' (1.829 m)  Wt 236 lb (107.049 kg)  BMI 32.00 kg/m2 , BMI Body mass index is 32 kg/(m^2). GEN: Well nourished, well developed, in no acute distress HEENT:  normal Neck: no JVD, carotid bruits, or masses Cardiac: RRR; no murmurs, rubs, or gallops,no edema  Respiratory:  clear to auscultation bilaterally, normal work of breathing GI: soft, nontender, nondistended, + BS MS: no deformity or atrophy Skin: warm and dry  Neuro:  Strength and sensation are intact Psych: euthymic mood, full affect  EKG:  EKG is ordered today. The ekg ordered today shows sinus rhythm with RBBB, Qtc 502   Recent Labs: 09/14/2013: Hemoglobin 14.5; Platelets 127.0*; TSH 0.85 09/16/2013: ALT 26 05/18/2014: BUN 18; Creatinine 0.96; Magnesium 2.2; Potassium 4.5; Sodium 141    Lipid Panel  Component Value Date/Time   CHOL 146 09/16/2013 1231   TRIG 69.0 09/16/2013 1231   HDL 47.70 09/16/2013 1231   CHOLHDL 3 09/16/2013 1231   VLDL 13.8 09/16/2013 1231   LDLCALC 85 09/16/2013 1231     Wt Readings from Last 3 Encounters:  05/25/14 236 lb (107.049 kg)  05/18/14 224 lb 12.8 oz (101.969 kg)  03/21/14 234 lb 12.8 oz (106.505 kg)      Other studies Reviewed: Additional studies/ records that were reviewed today include: echo 2014  Review of the above records today demonstrates: LA 40 mm   ASSESSMENT AND PLAN:  1.  Paroxysmal atrial fibrillation The patient has symptomatic recurrent afib.  He has failed medical therapy with tikosyn.  He is s/p prior CTI ablation by me.  I will repeat an echo at this time (last echo was 2 years ago) to evaluate for structural changes.  If his EF is depressed then we could consider GENETIC AF study.  His AF is an unstable/ worsening problem. Therapeutic strategies for afib including medicine and ablation were discussed in detail with the patient today.  We discussed amiodarone as perhaps our only alternative.  Risk, benefits, and alternatives to EP study and radiofrequency ablation for afib were also discussed in detail today. These risks include but are not limited to stroke, bleeding, vascular damage, tamponade, perforation, damage  to the esophagus, lungs, and other structures, pulmonary vein stenosis, worsening renal function, and death. The patient understands these risk and wishes to further contemplate his options.  He will discuss this further with Dr Mare Ferrari when he sees him in the next few days.  I think that he would be a good candidate for ablation.  chads2vasc score is at least 3.  Continue xarelto  2. OSA Compliance with CPAP is advised  3. HTN Stable No change required today     Current medicines are reviewed at length with the patient today.   The patient does not have concerns regarding his medicines.  The following changes were made today:  none  Labs/ tests ordered today include:  Orders Placed This Encounter  Procedures  . EKG 12-Lead  . 2D Echocardiogram without contrast    Follow-up with Dr Mare Ferrari as scheduled He will contact my office if he decides to consider ablation.  Army Fossa, MD  05/25/2014 3:39 PM     [address] [place] [address] [phone] (office) [phone] (fax)

## 2014-06-01 ENCOUNTER — Ambulatory Visit (HOSPITAL_COMMUNITY): Payer: Medicare Other | Attending: Internal Medicine | Admitting: Cardiology

## 2014-06-01 DIAGNOSIS — I4891 Unspecified atrial fibrillation: Secondary | ICD-10-CM | POA: Diagnosis present

## 2014-06-01 NOTE — Progress Notes (Signed)
Echo performed. 

## 2014-06-03 ENCOUNTER — Other Ambulatory Visit: Payer: Self-pay | Admitting: *Deleted

## 2014-06-03 MED ORDER — TRAMADOL HCL 50 MG PO TABS: 50.0000 mg | ORAL_TABLET | Freq: Four times a day (QID) | ORAL | Status: DC | PRN

## 2014-06-07 ENCOUNTER — Ambulatory Visit (INDEPENDENT_AMBULATORY_CARE_PROVIDER_SITE_OTHER): Payer: Medicare Other | Admitting: Cardiology

## 2014-06-07 ENCOUNTER — Encounter: Payer: Self-pay | Admitting: Cardiology

## 2014-06-07 VITALS — BP 104/72 | HR 65 | Ht 72.0 in | Wt 226.0 lb

## 2014-06-07 DIAGNOSIS — I119 Hypertensive heart disease without heart failure: Secondary | ICD-10-CM

## 2014-06-07 DIAGNOSIS — I480 Paroxysmal atrial fibrillation: Secondary | ICD-10-CM

## 2014-06-07 NOTE — Patient Instructions (Signed)
Your physician recommends that you continue on your current medications as directed. Please refer to the Current Medication list given to you today.  Your physician recommends that you schedule a follow-up appointment in: 3 month ov/bmet/magnesium

## 2014-06-07 NOTE — Progress Notes (Signed)
Cardiology Office Note   Date:  06/07/2014   ID:  Connor [name], DOB 1933/05/22, MRN [id number]  PCP:  Connor Snare, MD  Cardiologist:   Connor Coco, MD   No chief complaint on file.     History of Present Illness: Connor [name] is a 81 y.o. male who presents for scheduled follow-up office visit. This pleasant 81 year old gentleman is seen for a scheduled followup office visit. He has a history of recurrent atrial fibrillation. He has had previous ablation procedure by Dr. Rayann [name] for atrial flutter. He is on long-term Xarelto for chronic anticoagulation. He has a history of high blood pressure and hypercholesterolemia. He has had a previous deep vein thrombosis in the setting of right hip surgery. He has less energy and has been more short of breath whenever he is not in sinus rhythm. The patient is now on Tikosyn. He converted after initial doses and has been on Tikosyn. He has been tolerating the Tikosyn well. The patient was hospitalized briefly on 10/27/12 after he fell and landed on his right chest. He was concerned that he might have fractured a rib and [facility] where he was noted to be in atrial fibrillation with rapid ventricular response. He was transferred to [facility] and the following day underwent direct-current cardioversion successfully and was discharged home later that same day. The patient brought in a diary of his episodes when he is in atrial fibrillation. Typically he will stay out of rhythm for about 12-18 hours and then go back into rhythm. He takes extra metoprolol on a when necessary basis. The patient has a history of obstructive sleep apnea and uses a CPAP machine 5-7 hours per night. He has now been in normal sinus rhythm for 21 consecutive days.  His last episode of atrial fibrillation was on 05/16/2014.  The episode lasted until 05/18/2014 and he actually came to the office for evaluation but converted in the elevator on the way  up to the office.  He was seen at that time by Connor [name] and a week later was seen by Dr. Rayann [name]. Overall his episodes of atrial fibrillation are becoming more frequent.  He is being considered for the Genetic AF study.  He is also considering undergoing atrial fibrillation ablation.  His initial ablation procedure was for atrial flutter.  Of note is the fact that his brother has had 3 ablation procedures for atrial fibrillation, all at [facility]. The patient had a recent echocardiogram on 06/01/14 which showed his ejection fraction to be 35-40%.   Past Medical History  Diagnosis Date  . Hypertension   . Atrial flutter     s/p ablation in March 2012  . Hyperlipidemia   . Vitamin D deficiency   . RBBB (right bundle branch block)   . BPH (benign prostatic hyperplasia)   . Osteoarthritis   . Anemia   . Peptic ulcer disease   . DVT (deep venous thrombosis) 2/12    in setting of R hip surgery, he developed R femoral DVT  . Chronic anticoagulation     on Xarelto for afib  . Abnormal nuclear cardiac imaging test 1/13    Abnormal stress test 05/2011, but cardiac cath 06/2011 showing normal coronary anatomy.  . Paroxysmal atrial fibrillation 9/12    a. documented by Dr Barrett Shell on office EKG 9/12. b. maintained on Tikosyn and Xarelto. c. s/p DCCV 10/2012.  Connor [name] Premature ventricular contraction   . Obstructive sleep apnea  compliant with CPAP.  managed by Dr Connor [name].    Past Surgical History  Procedure Laterality Date  . Total hip arthroplasty  06/04/10    RIGHT HIP  . Total knee arthroplasty    . Transurethral resection of prostate    . Atrial flutter ablation  5/12    by JA  . Cardiac catheterization    . Cardioversion N/A 10/28/2012    Procedure: CARDIOVERSION;  Surgeon: Burnell Blanks, MD;  Location: [place];  Service: Cardiovascular;  Laterality: N/A;  . Left heart catheterization with coronary angiogram N/A 06/07/2011    Procedure: LEFT HEART CATHETERIZATION WITH CORONARY ANGIOGRAM;   Surgeon: Peter M Martinique, MD;  Location: [facility] CATH LAB;  Service: Cardiovascular;  Laterality: N/A;     Current Outpatient Prescriptions  Medication Sig Dispense Refill  . atorvastatin (LIPITOR) 10 MG tablet Take 1 tablet (10 mg total) by mouth daily. 30 tablet 5  . Cholecalciferol (VITAMIN D3) 5000 UNITS TABS Take 5,000 Units by mouth daily.      Connor [name] Calcium (STOOL SOFTENER PO) Take 1 capsule by mouth daily as needed (constipation).     . dofetilide (TIKOSYN) 500 MCG capsule TAKE 1 CAPSULE BY MOUTH EVERY 12 HOURS 180 capsule 3  . methocarbamol (ROBAXIN) 500 MG tablet Take 1 tablet (500 mg total) by mouth at bedtime as needed (pain). 30 tablet 3  . metoprolol succinate (TOPROL-XL) 50 MG 24 hr tablet TAKE 1 TABLET IN THE MORNING AND 1/2 IN THE EVENING (Patient taking differently: TAKE 1 TABLET BY MOUTH IN THE MORNING AND 1/2 TABLET BY MOUTH IN THE EVENING) 135 tablet 1  . metoprolol tartrate (LOPRESSOR) 25 MG tablet TAKE 1 TABLET EVERY 6 HOURS AS NEEDED FOR FAST HEART RATES (Patient taking differently: TAKE 1 TABLET BY MOUTH EVERY 6 HOURS AS NEEDED FOR FAST HEART RATES) 30 tablet 11  . Multiple Vitamin (MULITIVITAMIN) LIQD Take 5 mLs by mouth daily.    . Multiple Vitamins-Minerals (ICAPS PO) Take 1 tablet by mouth daily.     . Omega-3 Fatty Acids (OMEGA 3 PO) Take 1 capsule by mouth daily.     Connor [name] omeprazole (PRILOSEC) 20 MG capsule Take 20 mg by mouth daily.      Connor [name] OVER THE COUNTER MEDICATION Place 3 drops into both eyes daily as needed (dry eyes). "thera tears"    . potassium chloride SA (K-DUR,KLOR-CON) 20 MEQ tablet Take 2 tablets (48meq) by mouth in the morning and in the evening. 360 tablet 3  . tamsulosin (FLOMAX) 0.4 MG CAPS capsule TAKE ONE CAPSULE BY MOUTH 30 MINUTES AFTER SAME MEAL EACH DAY 30 capsule 6  . traMADol (ULTRAM) 50 MG tablet Take 1 tablet (50 mg total) by mouth every 6 (six) hours as needed. For pain 60 tablet 2  . XARELTO 20 MG TABS tablet TAKE 1 TABLET BY MOUTH EVERY  DAY 30 tablet 7   Current Facility-Administered Medications  Medication Dose Route Frequency Provider Last Rate Last Dose  . pneumococcal 13-valent conjugate vaccine (PREVNAR 13) injection 0.5 mL  0.5 mL Intramuscular Tomorrow-1000 Connor Snare, MD        Allergies:   Penicillins    Social History:  The patient  reports that he quit smoking about 36 years ago. His smoking use included Cigarettes. He has a 30 pack-year smoking history. He has never used smokeless tobacco. He reports that he drinks about 3.0 oz of alcohol per week. He reports that he does not use illicit drugs.   Family  History:  The patient's family history includes Arrhythmia in his sister; Atrial fibrillation in his brother; Heart attack in his father; Lung disease in his mother. his brother has had 3 ablation procedures for atrial fibrillation at [facility]   ROS:  Please see the history of present illness.   Otherwise, review of systems are positive for none.   All other systems are reviewed and negative.    PHYSICAL EXAM: VS:  BP 104/72 mmHg  Pulse 65  Ht 6' (1.829 m)  Wt 226 lb (102.513 kg)  BMI 30.64 kg/m2 , BMI Body mass index is 30.64 kg/(m^2). GEN: Well nourished, well developed, in no acute distress HEENT: normal Neck: no JVD, carotid bruits, or masses Cardiac: RRR; no murmurs, rubs, or gallops,no edema  Respiratory:  clear to auscultation bilaterally, normal work of breathing GI: soft, nontender, nondistended, + BS MS: no deformity or atrophy Skin: warm and dry, no rash Neuro:  Strength and sensation are intact Psych: euthymic mood, full affect   EKG:  EKG is not ordered today. His rhythm is regular today   Recent Labs: 09/14/2013: Hemoglobin 14.5; Platelets 127.0*; TSH 0.85 09/16/2013: ALT 26 05/18/2014: BUN 18; Creatinine 0.96; Magnesium 2.2; Potassium 4.5; Sodium 141    Lipid Panel    Component Value Date/Time   CHOL 146 09/16/2013 1231   TRIG 69.0 09/16/2013 1231   HDL 47.70 09/16/2013 1231    CHOLHDL 3 09/16/2013 1231   VLDL 13.8 09/16/2013 1231   LDLCALC 85 09/16/2013 1231      Wt Readings from Last 3 Encounters:  06/07/14 226 lb (102.513 kg)  05/25/14 236 lb (107.049 kg)  05/18/14 224 lb 12.8 oz (101.969 kg)      Other studies Reviewed: Additional studies/ records that were reviewed today include: Echocardiogram of 06/01/14. Review of the above records demonstrates: Ejection fraction 35-40%   ASSESSMENT AND PLAN:  1. paroxysmal atrial fibrillation, status post previous ablation procedure for atrial flutter 2. essential hypertension without heart failure. 3. Hypercholesterolemia 4. Osteoarthritis 5. obstructive sleep apnea 6. right bundle branch block 7.  Left ventricular systolic dysfunction with ejection fraction 35-40% by echo   Current medicines are reviewed at length with the patient today.  The patient does not have concerns regarding medicines.  The following changes have been made:  no change  Labs/ tests ordered today include: None  No orders of the defined types were placed in this encounter.     Disposition:   FU with Dr. Mare Ferrari in 3 months for  follow-up office visit and serum magnesium and basal metabolic panel. The patient would be willing to consider the genetic AF study.  He would also be willing to consider ablation procedure.  I suggested that he contact Dr. Rayann [name] to discuss options further.   Signed, Connor Coco, MD  06/07/2014 1:28 PM    [facility] [address], [place], [place]  [address] Phone: [phone]; Fax: [phone]

## 2014-06-13 ENCOUNTER — Encounter: Payer: Self-pay | Admitting: Cardiology

## 2014-06-13 ENCOUNTER — Other Ambulatory Visit: Payer: Self-pay | Admitting: *Deleted

## 2014-06-13 MED ORDER — RIVAROXABAN 20 MG PO TABS
20.0000 mg | ORAL_TABLET | Freq: Every day | ORAL | Status: DC
Start: 2014-06-13 — End: 2015-06-22

## 2014-06-22 NOTE — Progress Notes (Signed)
Pt scheduled for Genetic AF screening visit 06/27/14 @ 0830.

## 2014-06-27 NOTE — Progress Notes (Addendum)
Genetic AF Informed Consent  Subject Name: Connor [name]  Subject met inclusion and exclusion criteria. The informed consent form, study requirements and expectations were reviewed with the subject and questions and concerns were addressed prior to the signing of the consent form. The subject verbalized understanding of the trail requirements. The subject agreed to participate in the Genetic AF trial and signed the informed consent. The informed consent was obtained prior to performance of any protocol-specific procedures for the subject. A copy of the signed informed consent was given to the subject and a copy was placed in the subject's medical record.   Pamala Duffel  06/27/2014 @ 0845

## 2014-06-29 ENCOUNTER — Other Ambulatory Visit: Payer: Self-pay | Admitting: Cardiology

## 2014-06-30 ENCOUNTER — Other Ambulatory Visit: Payer: Self-pay

## 2014-06-30 MED ORDER — ATORVASTATIN CALCIUM 10 MG PO TABS: 10.0000 mg | ORAL_TABLET | Freq: Every day | ORAL | Status: DC

## 2014-07-06 ENCOUNTER — Encounter: Payer: Self-pay | Admitting: Cardiology

## 2014-07-11 ENCOUNTER — Encounter: Payer: Self-pay | Admitting: Internal Medicine

## 2014-07-11 ENCOUNTER — Ambulatory Visit (INDEPENDENT_AMBULATORY_CARE_PROVIDER_SITE_OTHER): Payer: Medicare Other | Admitting: Internal Medicine

## 2014-07-11 VITALS — BP 118/76 | HR 66 | Ht 72.0 in | Wt 229.8 lb

## 2014-07-11 DIAGNOSIS — G4733 Obstructive sleep apnea (adult) (pediatric): Secondary | ICD-10-CM

## 2014-07-11 DIAGNOSIS — I4891 Unspecified atrial fibrillation: Secondary | ICD-10-CM

## 2014-07-11 DIAGNOSIS — I10 Essential (primary) hypertension: Secondary | ICD-10-CM

## 2014-07-11 DIAGNOSIS — I519 Heart disease, unspecified: Secondary | ICD-10-CM

## 2014-07-11 NOTE — Patient Instructions (Signed)
Your physician recommends that you schedule a follow-up appointment in: 4 months with Dr Rayann Heman

## 2014-07-11 NOTE — Progress Notes (Signed)
Electrophysiology Office Note   Date:  07/11/2014   ID:  Connor [name], DOB 1933/09/08, MRN [id number]  PCP:  Elayne Snare, MD  Cardiologist:  Dr Mare Ferrari Primary Electrophysiologist:  [name], MD    Chief Complaint  Patient presents with  . Follow-up    AFIB RVR     History of Present Illness: Connor [name] is a 81 y.o. male who presents today for electrophysiology follow-up.   He has paroxysmal atrial fibrillation.   He has taken [place] since 2013.  He underwent atrial flutter ablation by me in 2012.  He has tried amiodarone previously but doesn't recall problems with this.   He remains very active.   He is compliant with CPAP.  During AF he reports symptoms of increased urination, palpitations, and weakness.  His exercise tolerance is much reduced in AF.  He has had very little afib since I saw him last. Today, he denies symptoms of chest pain, shortness of breath, orthopnea, PND, lower extremity edema, claudication, dizziness, presyncope, syncope, bleeding, or neurologic sequela. The patient is tolerating medications without difficulties and is otherwise without complaint today.    Past Medical History  Diagnosis Date  . Hypertension   . Atrial flutter     s/p ablation in March 2012  . Hyperlipidemia   . Vitamin D deficiency   . RBBB (right bundle branch block)   . BPH (benign prostatic hyperplasia)   . Osteoarthritis   . Anemia   . Peptic ulcer disease   . DVT (deep venous thrombosis) 2/12    in setting of R hip surgery, he developed R femoral DVT  . Chronic anticoagulation     on Xarelto for afib  . Abnormal nuclear cardiac imaging test 1/13    Abnormal stress test 05/2011, but cardiac cath 06/2011 showing normal coronary anatomy.  . Paroxysmal atrial fibrillation 9/12    a. documented by Dr Barrett Shell on office EKG 9/12. b. maintained on Tikosyn and Xarelto. c. s/p DCCV 10/2012.  Marland Kitchen Premature ventricular contraction   . Obstructive sleep apnea     compliant with  CPAP.  managed by Dr Elsworth Soho.   Past Surgical History  Procedure Laterality Date  . Total hip arthroplasty  06/04/10    RIGHT HIP  . Total knee arthroplasty    . Transurethral resection of prostate    . Atrial flutter ablation  5/12    by JA  . Cardiac catheterization    . Cardioversion N/A 10/28/2012    Procedure: CARDIOVERSION;  Surgeon: Burnell Blanks, MD;  Location: [facility];  Service: Cardiovascular;  Laterality: N/A;  . Left heart catheterization with coronary angiogram N/A 06/07/2011    Procedure: LEFT HEART CATHETERIZATION WITH CORONARY ANGIOGRAM;  Surgeon: Peter M Martinique, MD;  Location: [facility] CATH LAB;  Service: Cardiovascular;  Laterality: N/A;     Current Outpatient Prescriptions  Medication Sig Dispense Refill  . atorvastatin (LIPITOR) 10 MG tablet Take 1 tablet (10 mg total) by mouth daily. 30 tablet 5  . Cholecalciferol (VITAMIN D3) 5000 UNITS TABS Take 5,000 Units by mouth daily.      . clindamycin (CLEOCIN T) 1 % external solution Apply to scalp daily as needed  2  . Docusate Calcium (STOOL SOFTENER PO) Take 1 capsule by mouth daily as needed (constipation).     . dofetilide (TIKOSYN) 500 MCG capsule TAKE 1 CAPSULE BY MOUTH EVERY 12 HOURS 180 capsule 3  . methocarbamol (ROBAXIN) 500 MG tablet Take 1 tablet (500 mg total)  by mouth at bedtime as needed (pain). 30 tablet 3  . metoprolol succinate (TOPROL-XL) 50 MG 24 hr tablet TAKE 1 TABLET IN THE MORNING AND 1/2 IN THE EVENING (Patient taking differently: TAKE 1 TABLET BY MOUTH IN THE MORNING AND 1/2 TABLET BY MOUTH IN THE EVENING) 135 tablet 1  . metoprolol tartrate (LOPRESSOR) 25 MG tablet TAKE 1 TABLET EVERY 6 HOURS AS NEEDED FOR FAST HEART RATES (Patient taking differently: TAKE 1 TABLET BY MOUTH EVERY 6 HOURS AS NEEDED FOR FAST HEART RATES) 30 tablet 11  . Multiple Vitamin (MULITIVITAMIN) LIQD Take 5 mLs by mouth daily.    . Multiple Vitamins-Minerals (ICAPS PO) Take 1 tablet by mouth daily.     . Omega-3 Fatty Acids  (OMEGA 3 PO) Take 1 capsule by mouth daily.     Marland Kitchen omeprazole (PRILOSEC) 20 MG capsule Take 20 mg by mouth daily.      Marland Kitchen OVER THE COUNTER MEDICATION Place 3 drops into both eyes daily as needed (dry eyes). "thera tears"    . potassium chloride SA (K-DUR,KLOR-CON) 20 MEQ tablet Take 2 tablets (39meq) by mouth in the morning and in the evening. 360 tablet 3  . rivaroxaban (XARELTO) 20 MG TABS tablet Take 1 tablet (20 mg total) by mouth daily. 90 tablet 3  . tamsulosin (FLOMAX) 0.4 MG CAPS capsule TAKE ONE CAPSULE BY MOUTH 30 MINUTES AFTER SAME MEAL EACH DAY 30 capsule 6  . traMADol (ULTRAM) 50 MG tablet Take 1 tablet (50 mg total) by mouth every 6 (six) hours as needed. For pain 60 tablet 2   Current Facility-Administered Medications  Medication Dose Route Frequency Provider Last Rate Last Dose  . pneumococcal 13-valent conjugate vaccine (PREVNAR 13) injection 0.5 mL  0.5 mL Intramuscular Tomorrow-1000 Elayne Snare, MD        Allergies:   Penicillins   Social History:  The patient  reports that he quit smoking about 36 years ago. His smoking use included Cigarettes. He has a 30 pack-year smoking history. He has never used smokeless tobacco. He reports that he drinks about 3.0 oz of alcohol per week. He reports that he does not use illicit drugs.   Family History:  The patient's family history includes Arrhythmia in his sister; Atrial fibrillation in his brother; Heart attack in his father; Lung disease in his mother.    ROS:  Please see the history of present illness.   All other systems are reviewed and negative.    PHYSICAL EXAM: VS:  BP 118/76 mmHg  Pulse 66  Ht 6' (1.829 m)  Wt 229 lb 12.8 oz (104.237 kg)  BMI 31.16 kg/m2 , BMI Body mass index is 31.16 kg/(m^2). GEN: Well nourished, well developed, in no acute distress HEENT: normal Neck: no JVD, carotid bruits, or masses Cardiac: RRR; no murmurs, rubs, or gallops,no edema  Respiratory:  clear to auscultation bilaterally, normal work  of breathing GI: soft, nontender, nondistended, + BS MS: no deformity or atrophy Skin: warm and dry  Neuro:  Strength and sensation are intact Psych: euthymic mood, full affect  EKG:  EKG is ordered today. The ekg ordered today shows sinus rhythm 66 bpm, PR 174 msec, with RBBB, Qtc 505   Recent Labs: 09/14/2013: Hemoglobin 14.5; Platelets 127.0*; TSH 0.85 09/16/2013: ALT 26 05/18/2014: BUN 18; Creatinine 0.96; Magnesium 2.2; Potassium 4.5; Sodium 141    Lipid Panel     Component Value Date/Time   CHOL 146 09/16/2013 1231   TRIG 69.0 09/16/2013 1231  HDL 47.70 09/16/2013 1231   CHOLHDL 3 09/16/2013 1231   VLDL 13.8 09/16/2013 1231   LDLCALC 85 09/16/2013 1231     Wt Readings from Last 3 Encounters:  07/11/14 229 lb 12.8 oz (104.237 kg)  06/07/14 226 lb (102.513 kg)  05/25/14 236 lb (107.049 kg)      Other studies Reviewed: Additional studies/ records that were reviewed today include: echo 2014  Review of the above records today demonstrates: LA 40 mm   ASSESSMENT AND PLAN:  1.  Paroxysmal atrial fibrillation The patient has symptomatic recurrent afib.  Presently, he feels that his afib is reasonably controlled with tikosyn.  He has been screened for the GENETIC AF study and indeed has the appropriate genetic marker.  Presently, however he would like to continue his current regimen with tikosyn.  Should his afib burden increase then he would consider either genetic AF study enrollment or ablation.  chads2vasc score is at least 3.  Continue xarelto  2. OSA Compliance with CPAP is advised  3. HTN Stable No change required today  4. Nonischemic CM Not presently on an ace inhibitor This should be considered when he sees Dr Mare Ferrari in follow-up  Current medicines are reviewed at length with the patient today.   The patient does not have concerns regarding his medicines.  The following changes were made today:  none  Follow-up with Dr Mare Ferrari as scheduled I  will see again in 4 months  Signed, [name], MD  07/11/2014 10:31 PM     [facility] [address] [place] [address] [phone] (office) [phone] (fax)

## 2014-07-23 ENCOUNTER — Other Ambulatory Visit: Payer: Self-pay | Admitting: Cardiology

## 2014-08-31 ENCOUNTER — Other Ambulatory Visit: Payer: Self-pay | Admitting: Cardiology

## 2014-09-06 ENCOUNTER — Other Ambulatory Visit: Payer: Medicare Other

## 2014-09-06 ENCOUNTER — Ambulatory Visit: Payer: Medicare Other | Admitting: Cardiology

## 2014-09-16 ENCOUNTER — Ambulatory Visit (INDEPENDENT_AMBULATORY_CARE_PROVIDER_SITE_OTHER): Payer: Medicare Other | Admitting: Cardiology

## 2014-09-16 ENCOUNTER — Other Ambulatory Visit (INDEPENDENT_AMBULATORY_CARE_PROVIDER_SITE_OTHER): Payer: Medicare Other | Admitting: *Deleted

## 2014-09-16 ENCOUNTER — Encounter: Payer: Self-pay | Admitting: Cardiology

## 2014-09-16 VITALS — BP 116/68 | HR 62 | Ht 72.0 in | Wt 228.4 lb

## 2014-09-16 DIAGNOSIS — I480 Paroxysmal atrial fibrillation: Secondary | ICD-10-CM | POA: Diagnosis not present

## 2014-09-16 DIAGNOSIS — I519 Heart disease, unspecified: Secondary | ICD-10-CM | POA: Diagnosis not present

## 2014-09-16 DIAGNOSIS — I119 Hypertensive heart disease without heart failure: Secondary | ICD-10-CM | POA: Diagnosis not present

## 2014-09-16 LAB — MAGNESIUM: Magnesium: 2.2 mg/dL (ref 1.5–2.5)

## 2014-09-16 LAB — BASIC METABOLIC PANEL
BUN: 9 mg/dL (ref 6–23)
CO2: 31 mEq/L (ref 19–32)
CREATININE: 0.79 mg/dL (ref 0.40–1.50)
Calcium: 8.9 mg/dL (ref 8.4–10.5)
Chloride: 105 mEq/L (ref 96–112)
GFR: 100.68 mL/min (ref >60.00)
Glucose, Bld: 109 mg/dL — ABNORMAL HIGH (ref 70–99)
POTASSIUM: 4.1 meq/L (ref 3.5–5.1)
SODIUM: 138 meq/L (ref 135–145)

## 2014-09-16 NOTE — Progress Notes (Signed)
Cardiology Office Note   Date:  09/16/2014   ID:  Connor [name], DOB Nov 17, 1933, MRN [id number]  PCP:  Elayne Snare, MD  Cardiologist: Darlin Coco MD  No chief complaint on file.     History of Present Illness: Connor [name] is a 81 y.o. male who presents for a four-month follow-up visit  Connor [name] is a 81 y.o. male who presents for scheduled follow-up office visit. This pleasant 81 year old gentleman is seen for a scheduled followup office visit. He has a history of recurrent atrial fibrillation. He has had previous ablation procedure by Dr. Rayann Heman for atrial flutter. He is on long-term Xarelto for chronic anticoagulation. He has a history of high blood pressure and hypercholesterolemia. He has had a previous deep vein thrombosis in the setting of right hip surgery. He has less energy and has been more short of breath whenever he is not in sinus rhythm. The patient is now on Tikosyn. He converted after initial doses and has been on Tikosyn. He has been tolerating the Tikosyn well. The patient was hospitalized briefly on 10/27/12 after he fell and landed on his right chest. He was concerned that he might have fractured a rib and went [facility] where he was noted to be in atrial fibrillation with rapid ventricular response. He was transferred to [facility] and the following day underwent direct-current cardioversion successfully and was discharged home later that same day. The patient brought in a diary of his episodes when he is in atrial fibrillation. Typically he will stay out of rhythm for about 12-18 hours and then go back into rhythm. He takes extra metoprolol on a when necessary basis. The patient has a history of obstructive sleep apnea and uses a CPAP machine 5-7 hours per night. Since last visit he has been in normal sinus rhythm most of the time.  His longest period out of rhythm was a period of 49 hours. Overall his episodes of atrial  fibrillation are becoming more frequent. He is being considered for the Genetic AF study.  He dates that he qualified for the genetic AF study but has not decided whether to participate. Marland Kitchen His initial ablation procedure was for atrial flutter. Of note is the fact that his brother has had 3 ablation procedures for atrial fibrillation, all at [facility]. The patient had a recent echocardiogram on 06/01/14 which showed his ejection fraction to be 35-40%. The patient denies any chest discomfort.  He rides an exercise bike.  He notes that his resting heart rate is slower now that he is in better overall condition.  He goes to the gym 3 times a week. He will be having some endodontal dental surgery next week.  He will leave off his Xarelto for a day prior to the procedure.   Past Medical History  Diagnosis Date  . Hypertension   . Atrial flutter     s/p ablation in March 2012  . Hyperlipidemia   . Vitamin D deficiency   . RBBB (right bundle branch block)   . BPH (benign prostatic hyperplasia)   . Osteoarthritis   . Anemia   . Peptic ulcer disease   . DVT (deep venous thrombosis) 2/12    in setting of R hip surgery, he developed R femoral DVT  . Chronic anticoagulation     on Xarelto for afib  . Abnormal nuclear cardiac imaging test 1/13    Abnormal stress test 05/2011, but cardiac cath 06/2011 showing  normal coronary anatomy.  . Paroxysmal atrial fibrillation 9/12    a. documented by Dr Barrett Shell on office EKG 9/12. b. maintained on Tikosyn and Xarelto. c. s/p DCCV 10/2012.  Marland Kitchen Premature ventricular contraction   . Obstructive sleep apnea     compliant with CPAP.  managed by Dr Elsworth Soho.    Past Surgical History  Procedure Laterality Date  . Total hip arthroplasty  06/04/10    RIGHT HIP  . Total knee arthroplasty    . Transurethral resection of prostate    . Atrial flutter ablation  5/12    by JA  . Cardiac catheterization    . Cardioversion N/A 10/28/2012    Procedure: CARDIOVERSION;  Surgeon:  Burnell Blanks, MD;  Location: [facility];  Service: Cardiovascular;  Laterality: N/A;  . Left heart catheterization with coronary angiogram N/A 06/07/2011    Procedure: LEFT HEART CATHETERIZATION WITH CORONARY ANGIOGRAM;  Surgeon: Peter M Martinique, MD;  Location: [facility] CATH LAB;  Service: Cardiovascular;  Laterality: N/A;     Current Outpatient Prescriptions  Medication Sig Dispense Refill  . atorvastatin (LIPITOR) 10 MG tablet Take 1 tablet (10 mg total) by mouth daily. 30 tablet 5  . Cholecalciferol (VITAMIN D3) 5000 UNITS TABS Take 5,000 Units by mouth daily.      . clindamycin (CLEOCIN T) 1 % external solution Apply 1 application topically daily as needed (for bumps in scalp). Apply to scalp daily as needed  2  . Docusate Calcium (STOOL SOFTENER PO) Take 1 capsule by mouth daily as needed (constipation).     . dofetilide (TIKOSYN) 500 MCG capsule TAKE 1 CAPSULE BY MOUTH EVERY 12 HOURS 180 capsule 3  . methocarbamol (ROBAXIN) 500 MG tablet Take 1 tablet (500 mg total) by mouth at bedtime as needed (pain). 30 tablet 3  . metoprolol succinate (TOPROL-XL) 50 MG 24 hr tablet TAKE 1 TABLET IN THE MORNING AND 1/2 IN THE EVENING 135 tablet 1  . Multiple Vitamin (MULITIVITAMIN) LIQD Take 5 mLs by mouth daily.    . Multiple Vitamins-Minerals (ICAPS PO) Take 1 tablet by mouth daily.     . Omega-3 Fatty Acids (OMEGA 3 PO) Take 1 capsule by mouth daily.     Marland Kitchen omeprazole (PRILOSEC) 20 MG capsule Take 20 mg by mouth daily.      Marland Kitchen OVER THE COUNTER MEDICATION Place 3 drops into both eyes daily as needed (dry eyes). "thera tears"    . potassium chloride SA (K-DUR,KLOR-CON) 20 MEQ tablet Take 2 tablets (56meq) by mouth in the morning and in the evening. 360 tablet 3  . rivaroxaban (XARELTO) 20 MG TABS tablet Take 1 tablet (20 mg total) by mouth daily. 90 tablet 3  . tamsulosin (FLOMAX) 0.4 MG CAPS capsule TAKE ONE CAPSULE BY MOUTH ONCE DAILY 30 MINUTES AFTER THE SAME MEAL 30 capsule 6  . traMADol (ULTRAM) 50 MG  tablet Take 1 tablet (50 mg total) by mouth every 6 (six) hours as needed. For pain 60 tablet 2   Current Facility-Administered Medications  Medication Dose Route Frequency Provider Last Rate Last Dose  . pneumococcal 13-valent conjugate vaccine (PREVNAR 13) injection 0.5 mL  0.5 mL Intramuscular Tomorrow-1000 Elayne Snare, MD        Allergies:   Penicillins    Social History:  The patient  reports that he quit smoking about 36 years ago. His smoking use included Cigarettes. He has a 30 pack-year smoking history. He has never used smokeless tobacco. He reports that he drinks  about 3.0 oz of alcohol per week. He reports that he does not use illicit drugs.   Family History:  The patient's family history includes Arrhythmia in his sister; Atrial fibrillation in his brother; Heart attack in his father; Lung disease in his mother.    ROS:  Please see the history of present illness.   Otherwise, review of systems are positive for none.   All other systems are reviewed and negative.    PHYSICAL EXAM: VS:  BP 116/68 mmHg  Pulse 62  Ht 6' (1.829 m)  Wt 228 lb 6.4 oz (103.602 kg)  BMI 30.97 kg/m2 , BMI Body mass index is 30.97 kg/(m^2). GEN: Well nourished, well developed, in no acute distress HEENT: normal Neck: no JVD, carotid bruits, or masses Cardiac: RRR; no murmurs, rubs, or gallops,no edema  Respiratory:  clear to auscultation bilaterally, normal work of breathing GI: soft, nontender, nondistended, + BS MS: no deformity or atrophy Skin: warm and dry, no rash Neuro:  Strength and sensation are intact Psych: euthymic mood, full affect   EKG:  EKG is not ordered today.   Recent Labs: 05/18/2014: BUN 18; Creatinine 0.96; Magnesium 2.2; Potassium 4.5; Sodium 141    Lipid Panel    Component Value Date/Time   CHOL 146 09/16/2013 1231   TRIG 69.0 09/16/2013 1231   HDL 47.70 09/16/2013 1231   CHOLHDL 3 09/16/2013 1231   VLDL 13.8 09/16/2013 1231   LDLCALC 85 09/16/2013 1231        Wt Readings from Last 3 Encounters:  09/16/14 228 lb 6.4 oz (103.602 kg)  07/11/14 229 lb 12.8 oz (104.237 kg)  06/07/14 226 lb (102.513 kg)       ASSESSMENT AND PLAN:  1. paroxysmal atrial fibrillation, status post previous ablation procedure for atrial flutter 2. essential hypertension without heart failure. 3. Hypercholesterolemia 4. Osteoarthritis 5. obstructive sleep apnea 6. right bundle branch block 7. Left ventricular systolic dysfunction with ejection fraction 35-40% by echo   Current medicines are reviewed at length with the patient today. The patient does not have concerns regarding medicines.  The following changes have been made: no change  Labs/ tests ordered today include: None  No orders of the defined types were placed in this encounter.    Current medicines are reviewed at length with the patient today.  The patient does not have concerns regarding medicines.  The following changes have been made:  no change  Labs/ tests ordered today include:   Orders Placed This Encounter  Procedures  . Basic metabolic panel  . Magnesium   Plan: Continue current medication.  Blood work today is pending.  Recheck in 4 months for office visit basal metabolic panel and magnesium level.  Berna Spare MD 09/16/2014 1:14 PM    [facility] [place], [place],   [address] Phone: [phone]; Fax: [phone]

## 2014-09-16 NOTE — Patient Instructions (Signed)
Medication Instructions:  Your physician recommends that you continue on your current medications as directed. Please refer to the Current Medication list given to you today.  Labwork: NONE  Testing/Procedures: NONE  Follow-Up: Your physician recommends that you schedule a follow-up appointment in: 4 MONTH OV/BMET/MAGNESIUM

## 2014-09-17 NOTE — Progress Notes (Signed)
Quick Note:  Please report to patient. The recent labs are stable. Continue same medication and careful diet. ______ 

## 2014-09-20 ENCOUNTER — Ambulatory Visit (INDEPENDENT_AMBULATORY_CARE_PROVIDER_SITE_OTHER): Payer: Medicare Other | Admitting: Endocrinology

## 2014-09-20 ENCOUNTER — Encounter: Payer: Self-pay | Admitting: Endocrinology

## 2014-09-20 VITALS — BP 144/100 | HR 80 | Ht 71.75 in | Wt 228.0 lb

## 2014-09-20 DIAGNOSIS — E042 Nontoxic multinodular goiter: Secondary | ICD-10-CM | POA: Diagnosis not present

## 2014-09-20 DIAGNOSIS — E559 Vitamin D deficiency, unspecified: Secondary | ICD-10-CM | POA: Diagnosis not present

## 2014-09-20 DIAGNOSIS — E7800 Pure hypercholesterolemia, unspecified: Secondary | ICD-10-CM

## 2014-09-20 DIAGNOSIS — R7301 Impaired fasting glucose: Secondary | ICD-10-CM

## 2014-09-20 DIAGNOSIS — Z0000 Encounter for general adult medical examination without abnormal findings: Secondary | ICD-10-CM | POA: Diagnosis not present

## 2014-09-20 DIAGNOSIS — R42 Dizziness and giddiness: Secondary | ICD-10-CM

## 2014-09-20 LAB — LIPID PANEL
Cholesterol: 158 mg/dL (ref 0–200)
HDL: 50.8 mg/dL (ref >39.00)
LDL Cholesterol: 89 mg/dL (ref 0–99)
NONHDL: 107.2
TRIGLYCERIDES: 90 mg/dL (ref 0.0–149.0)
Total CHOL/HDL Ratio: 3
VLDL: 18 mg/dL (ref 0.0–40.0)

## 2014-09-20 LAB — VITAMIN D 25 HYDROXY (VIT D DEFICIENCY, FRACTURES): VITD: 74.39 ng/mL (ref 30.00–100.00)

## 2014-09-20 LAB — COMPREHENSIVE METABOLIC PANEL
ALBUMIN: 4.3 g/dL (ref 3.5–5.2)
ALT: 27 U/L (ref 0–53)
AST: 29 U/L (ref 0–37)
Alkaline Phosphatase: 60 U/L (ref 39–117)
BUN: 15 mg/dL (ref 6–23)
CO2: 30 mEq/L (ref 19–32)
Calcium: 9.4 mg/dL (ref 8.4–10.5)
Chloride: 106 mEq/L (ref 96–112)
Creatinine, Ser: 0.8 mg/dL (ref 0.40–1.50)
GFR: 99.23 mL/min (ref >60.00)
Glucose, Bld: 111 mg/dL — ABNORMAL HIGH (ref 70–99)
POTASSIUM: 4.2 meq/L (ref 3.5–5.1)
SODIUM: 140 meq/L (ref 135–145)
TOTAL PROTEIN: 7 g/dL (ref 6.0–8.3)
Total Bilirubin: 1.1 mg/dL (ref 0.2–1.2)

## 2014-09-20 LAB — POCT URINALYSIS DIPSTICK
Bilirubin, UA: NEGATIVE
Glucose, UA: NEGATIVE
Ketones, UA: NEGATIVE
NITRITE UA: NEGATIVE
PH UA: 5
RBC UA: NEGATIVE
Spec Grav, UA: >=1.03
UROBILINOGEN UA: NEGATIVE

## 2014-09-20 LAB — CBC WITH DIFFERENTIAL/PLATELET
BASOS ABS: 0 10*3/uL (ref 0.0–0.1)
BASOS PCT: 0.4 % (ref 0.0–3.0)
EOS ABS: 0.2 10*3/uL (ref 0.0–0.7)
EOS PCT: 3.4 % (ref 0.0–5.0)
HCT: 44.5 % (ref 39.0–52.0)
HEMOGLOBIN: 15.1 g/dL (ref 13.0–17.0)
LYMPHS PCT: 20.7 % (ref 12.0–46.0)
Lymphs Abs: 1 10*3/uL (ref 0.7–4.0)
MCHC: 34 g/dL (ref 30.0–36.0)
MCV: 98.3 fl (ref 78.0–100.0)
MONOS PCT: 11.1 % (ref 3.0–12.0)
Monocytes Absolute: 0.6 10*3/uL (ref 0.1–1.0)
NEUTROS ABS: 3.2 10*3/uL (ref 1.4–7.7)
Neutrophils Relative %: 64.4 % (ref 43.0–77.0)
Platelets: 138 10*3/uL — ABNORMAL LOW (ref 150.0–400.0)
RBC: 4.53 Mil/uL (ref 4.22–5.81)
RDW: 14.9 % (ref 11.5–15.5)
WBC: 5 10*3/uL (ref 4.0–10.5)

## 2014-09-20 LAB — TSH: TSH: 1.2 u[IU]/mL (ref 0.35–4.50)

## 2014-09-20 LAB — HEMOGLOBIN A1C: HEMOGLOBIN A1C: 5.8 % (ref 4.6–6.5)

## 2014-09-20 NOTE — Patient Instructions (Signed)
  Take  5000 units of Vitamin D3 daily   Low saturated fat diet, low intake of sugar and salt  Regular aerobic exercise 3 days a week  Regular dental and eye exams as recommended by the specialists  Make sure smoke detectors are functional at home Wear seat belts all the time

## 2014-09-20 NOTE — Progress Notes (Signed)
Quick Note:  Please let patient know that the fasting glucose is above normal at 111 indicating prediabetes but overall test is normal. Would recommend consultation with dietitian for meal planning Cholesterol is good Vitamin D level is on the high end, need to reduce the supplement of 5000 units to 3 times a week  ______

## 2014-09-20 NOTE — Progress Notes (Addendum)
Patient ID: Connor [name], male   DOB: 09-03-1933, 81 y.o.   MRN: [id number]     Subjective:   Patient here for annual preventive exam and management of chronic problems.    Risk factors:     Does have a history of mild impaired fasting glucose levels in the past, had fasting glucose of 102 in 2013 but normal A1c  Mild obesity: He has had difficulty losing weight  Hypercholesterolemia  Recurrent atrial fibrillation  Sleep apnea  Mild systolic LV dysfunction  Roster of Physicians Providing Medical Care to Patient:  See "snapshot" section of EMR  PREVENTIVE CARE:  Diet: As above Exercise: Goes to the gym using bike, weights, treadmill up to 40 minutes, 2-3/7 days a week History of falls: None   Annual hemoccults:           2014   vitamin D supplement:                               5000 units daily   Colonoscopy/sigmoidoscopy 2011      Yearly flu vaccine:                      yes    Zostavax:  2008   Pneumovax:  2000  Tetanus booster:   ? 1992    Lipid screening, last levels:  Lab Results  Component Value Date   CHOL 146 09/16/2013   HDL 47.70 09/16/2013   LDLCALC 85 09/16/2013   TRIG 69.0 09/16/2013   CHOLHDL 3 09/16/2013   Has been maintained on Lipitor for several years for hypercholesterolemia with baseline LDL levels up to 189  Activities of Daily Living: In his present state of health, has no difficulty performing daily activities   Home Safety: Has smoke detector and wears seat belts.   Depression Screen  See separate section  Review of Systems:   Weight gain: He has difficulty losing the weight which he had gained over wintertime and is about 10 pounds more than last year.  This is despite trying to be regular with exercising at the gym 3 days a week for about 40 minutes. He thinks he is controlling portions more recently.  Previously had tried Weight Wat[employer] programs He is able to increase his heart rate up to 100 with exercise, less so when he first  started  Wt Readings from Last 3 Encounters:  09/20/14 228 lb (103.42 kg)  09/16/14 228 lb 6.4 oz (103.602 kg)  07/11/14 229 lb 12.8 oz (104.237 kg)      Has history of mild hypertension, treated with metoprolol ER 50 milligrams in the morning--25 milligram in the evening Taking this partly because of a history of atrial fibrillation   Checking blood pressure almost daily at home and recent readings are ranging from 114-145/67-81; keeping a record of his cellphone and these were reviewed today.   HYPERLIPIDEMIA: : has had LDL levels up to 189 prior to treatment.   Has been careful with reducing saturated fats.  LDL controlled with Lipitor for several years    Lab Results  Component Value Date   CHOL 158 09/20/2014   HDL 50.80 09/20/2014   LDLCALC 89 09/20/2014   TRIG 90.0 09/20/2014   CHOLHDL 3 09/20/2014       Eyes: Normal, followed by ophthalmologist every 6 months.        ENT: Decreased hearing especially if background noise present. Using  bilateral hearing aids                    Thyroid:    No unusual fatigue.  Has had a small multinodular goiter since at least 1998, has been euthyroid  Lab Results  Component Value Date   TSH 0.85 09/14/2013       No chest pain on exertion.                      Has periodic palpitations from recurrent atrial fibrillation. is taking Tikosyn and is on Xarelto followed by cardiologist regularly He takes  extra metoprolol when he has rapid heart rate associated with palpitations, episodes occurring about 1-2x per month upto 2 days in duration, has not required recent hospitalization       No swelling of feet except mildly at the end of the day     No shortness of breath on exertion.     Bowel habits:  No change.   He has only rare heartburn.  Taking OTC Prilosec mostly prophylactically because of remote history of GI bleed possibly related to Daypro     Rectal bleeding/black stools: Not present.     Prior history of meatal stenosis  and doing self-catheterization rarely now, no difficulty with urine flow now and continues on Flomax for BPH from urologist who he will be seeing in 8/16. Has no significant nocturia or frequency/urgency.     Neurological: Episodes of lightheadedness/dizziness since about 2000.  Minimal symptoms recently when he will feel mild symptoms and a sensation of being off balance Previously had been sent for physical therapy for the cervical spine.  He thinks that when he presses on the back of the neck on the muscle this relieves his symptoms No numbness or tingling in his feet  Has had back pain from degenerated discs at L2-3, 3-4 and 4-5 levels and scoliosis. Has been taking  tramadol for relief and also massage therapy helps.  Minimal knee joint pain recently, has had knee replacement several years ago bilaterally      Medication List       This list is accurate as of: 09/20/14  9:16 AM.  Always use your most recent med list.               atorvastatin 10 MG tablet  Commonly known as:  LIPITOR  Take 1 tablet (10 mg total) by mouth daily.     clindamycin 1 % external solution  Commonly known as:  CLEOCIN T  Apply 1 application topically daily as needed (for bumps in scalp). Apply to scalp daily as needed     dofetilide 500 MCG capsule  Commonly known as:  TIKOSYN  TAKE 1 CAPSULE BY MOUTH EVERY 12 HOURS     ICAPS PO  Take 1 tablet by mouth daily.     methocarbamol 500 MG tablet  Commonly known as:  ROBAXIN  Take 1 tablet (500 mg total) by mouth at bedtime as needed (pain).     metoprolol succinate 25 MG 24 hr tablet  Commonly known as:  TOPROL-XL  Take 25 mg by mouth daily.     metoprolol succinate 50 MG 24 hr tablet  Commonly known as:  TOPROL-XL  TAKE 1 TABLET IN THE MORNING AND 1/2 IN THE EVENING     multivitamin Liqd  Take 5 mLs by mouth daily.     OMEGA 3 PO  Take 1 capsule by mouth daily.  omeprazole 20 MG capsule  Commonly known as:  PRILOSEC  Take 20 mg by  mouth daily.     OVER THE COUNTER MEDICATION  Place 3 drops into both eyes daily as needed (dry eyes). "thera tears"     potassium chloride SA 20 MEQ tablet  Commonly known as:  K-DUR,KLOR-CON  Take 2 tablets (44meq) by mouth in the morning and in the evening.     rivaroxaban 20 MG Tabs tablet  Commonly known as:  XARELTO  Take 1 tablet (20 mg total) by mouth daily.     STOOL SOFTENER PO  Take 1 capsule by mouth daily as needed (constipation).     tamsulosin 0.4 MG Caps capsule  Commonly known as:  FLOMAX  TAKE ONE CAPSULE BY MOUTH ONCE DAILY 30 MINUTES AFTER THE SAME MEAL     traMADol 50 MG tablet  Commonly known as:  ULTRAM  Take 1 tablet (50 mg total) by mouth every 6 (six) hours as needed. For pain     Vitamin D3 5000 UNITS Tabs  Take 5,000 Units by mouth daily.        Allergies:  Allergies  Allergen Reactions  . Penicillins Itching and Swelling    Past Medical History  Diagnosis Date  . Hypertension   . Atrial flutter     s/p ablation in March 2012  . Hyperlipidemia   . Vitamin D deficiency   . RBBB (right bundle branch block)   . BPH (benign prostatic hyperplasia)   . Osteoarthritis   . Anemia   . Peptic ulcer disease   . DVT (deep venous thrombosis) 2/12    in setting of R hip surgery, he developed R femoral DVT  . Chronic anticoagulation     on Xarelto for afib  . Abnormal nuclear cardiac imaging test 1/13    Abnormal stress test 05/2011, but cardiac cath 06/2011 showing normal coronary anatomy.  . Paroxysmal atrial fibrillation 9/12    a. documented by Dr Barrett Shell on office EKG 9/12. b. maintained on Tikosyn and Xarelto. c. s/p DCCV 10/2012.  Marland Kitchen Premature ventricular contraction   . Obstructive sleep apnea     compliant with CPAP.  managed by Dr Elsworth Soho.    Past Surgical History  Procedure Laterality Date  . Total hip arthroplasty  06/04/10    RIGHT HIP  . Total knee arthroplasty    . Transurethral resection of prostate    . Atrial flutter ablation   5/12    by JA  . Cardiac catheterization    . Cardioversion N/A 10/28/2012    Procedure: CARDIOVERSION;  Surgeon: Burnell Blanks, MD;  Location: [facility];  Service: Cardiovascular;  Laterality: N/A;  . Left heart catheterization with coronary angiogram N/A 06/07/2011    Procedure: LEFT HEART CATHETERIZATION WITH CORONARY ANGIOGRAM;  Surgeon: Peter M Martinique, MD;  Location[facility] CATH LAB;  Service: Cardiovascular;  Laterality: N/A;    Family History  Problem Relation Age of Onset  . Heart attack Father   . Lung disease Mother   . Atrial fibrillation Brother   . Arrhythmia Sister     Social History:  reports that he quit smoking about 36 years ago. His smoking use included Cigarettes. He has a 30 pack-year smoking history. He has never used smokeless tobacco. He reports that he drinks about 3.0 oz of alcohol per week. He reports that he does not use illicit drugs.    EXAM:  BP 144/100 mmHg  Pulse 80  Ht 5'  11.75" (1.822 m)  Wt 228 lb (103.42 kg)  BMI 31.15 kg/m2  SpO2 98%  GENERAL:  Large build, well-nourished, pleasant, looks well  No pallor, clubbing, lymphadenopathy or peripheral edema.   Skin:  no rash or abnormal pigmentation.   EYES:  Externally normal.  Fundii:  normal discs and vessels.  ENT:  oral cavity and pharynx normal. Tympanic membranes are dull, mostly on the left    THYROID:   just palpable on the lower right lobe, firm , slightly nodular  CAROTIDS:  Normal character; no bruit.  HEART:  Normal apex, S1 split, S2 normal, no S3/S4 ; no murmur.  CHEST:  Normal shape.  Lungs:  Vescicular breath sounds bilaterally.  No crepitations/ wheeze.  ABDOMEN:  No distention. no abnormal epigastric pulsation.  Liver and spleen not palpable.  No other mass or tenderness. No evident hernia  RECTAL exam :   Deferred to urologist  NEUROLOGICAL:   No muscle atrophy. Muscle power grossly normal in all 4 extremities  Biceps reflexes are normal, and reflexes absent    SPINE AND JOINTS:  Mild scoliosis lumbar spine. Also clawfoot bilaterally    PERIPHERAL PULSES: pedal pulses normal    Assessment/Plan:   During the course of the visit the patient was educated and counseled about appropriate screening and preventive services including:   Diabetes screening which will be done today  Nutrition counseling : low fat diet, reduced portions  Vaccines:  These are all up-to-date  Screening for colon cancer: Hemoccult given today.  Colonoscopy most likely will be due in 2017, to be contacted by gastroenterologist Not a candidate for PSA testing Needs followup for annual exam once a year Continue regular exercise regimen at least 3 days a week  Active medical problems:  Hyperlipidemia: His lipids will be checked today, previously under good level and he will continue Lipitor 10 mg daily for hypercholesterolemia  History of impaired fasting glucose: Will check fasting glucose and x-rays A1c today  Hypertension: Blood pressure is well controlled with metoprolol alone and he is monitoring regularly at home  Dizziness/lightheadedness: Appears to be related to cervical spondylosis and has minimal symptoms.  May need to resume home traction or physical therapy if symptoms recur  BPH and history of meatal stenosis: To continue followup with urologist  Atrial fibrillation: He will followup with cardiologist as scheduled, symptoms controlled with metoprolol when he has recurrences.  History of multinodular goiter: This is a relatively small and TSH is to be checked today   History of vitamin D deficiency: We will need follow-up level  Weight gain: He is compliant with exercise and is trying diet also.  Consider dietary consultation of fasting glucose is high  Chronic PPI therapy: He has not had any gastric bleed which was possibly related to Daypro in the past and he can reduce Prilosec to 3 times a week to prevent any possible long-term  effects  Connor [name] 09/20/2014, 9:16 AM   Addendum:  the fasting glucose is above normal at 111 indicating prediabetes but overall test is normal.  Would recommend consultation with dietitian for meal planning Cholesterol is good Vitamin D level is on the high end, need to reduce the supplement of 5000 units to 3 times a week

## 2014-09-20 NOTE — Addendum Note (Signed)
Addended by: Elayne Snare on: 09/20/2014 05:22 PM   Modules accepted: Level of Service

## 2014-09-30 ENCOUNTER — Other Ambulatory Visit: Payer: Medicare Other

## 2014-10-04 ENCOUNTER — Other Ambulatory Visit: Payer: Self-pay | Admitting: *Deleted

## 2014-10-04 MED ORDER — TAMSULOSIN HCL 0.4 MG PO CAPS: ORAL_CAPSULE | ORAL | Status: DC

## 2014-10-04 MED ORDER — ATORVASTATIN CALCIUM 10 MG PO TABS: 10.0000 mg | ORAL_TABLET | Freq: Every day | ORAL | Status: DC

## 2014-10-06 ENCOUNTER — Other Ambulatory Visit: Payer: Self-pay | Admitting: *Deleted

## 2014-10-06 ENCOUNTER — Other Ambulatory Visit (INDEPENDENT_AMBULATORY_CARE_PROVIDER_SITE_OTHER): Payer: Medicare Other

## 2014-10-06 DIAGNOSIS — Z1211 Encounter for screening for malignant neoplasm of colon: Secondary | ICD-10-CM

## 2014-10-06 LAB — FECAL OCCULT BLOOD, IMMUNOCHEMICAL: Fecal Occult Bld: NEGATIVE

## 2014-11-14 ENCOUNTER — Encounter: Payer: Self-pay | Admitting: Internal Medicine

## 2014-11-14 ENCOUNTER — Ambulatory Visit (INDEPENDENT_AMBULATORY_CARE_PROVIDER_SITE_OTHER): Payer: Medicare Other | Admitting: Internal Medicine

## 2014-11-14 VITALS — BP 124/68 | HR 59 | Ht 72.0 in | Wt 226.4 lb

## 2014-11-14 DIAGNOSIS — I480 Paroxysmal atrial fibrillation: Secondary | ICD-10-CM

## 2014-11-14 DIAGNOSIS — I481 Persistent atrial fibrillation: Secondary | ICD-10-CM

## 2014-11-14 DIAGNOSIS — I4819 Other persistent atrial fibrillation: Secondary | ICD-10-CM

## 2014-11-14 NOTE — Patient Instructions (Signed)
Medication Instructions: - no changes  Labwork: - none  Procedures/Testing: - none  Follow-Up: - Your physician recommends that you schedule a follow-up appointment in: 6 months with Roderic Palau, NP in the A-Fib clinic  Any Additional Special Instructions Will Be Listed Below (If Applicable). - none

## 2014-11-14 NOTE — Progress Notes (Signed)
Electrophysiology Office Note   Date:  11/14/2014   ID:  Connor [name], DOB 1934/01/15, MRN [id number]  PCP:  Elayne Snare, MD  Cardiologist:  Dr Mare Ferrari Primary Electrophysiologist:  [name], MD    Chief Complaint  Patient presents with  . AFIB w/RVR     History of Present Illness: Connor [name] is a 81 y.o. male who presents today for electrophysiology follow-up.   He has paroxysmal atrial fibrillation.   He has taken [place] since 2013.  He underwent atrial flutter ablation by me in 2012.  He has tried amiodarone previously but doesn't recall problems with this.   He remains very active.   He is compliant with CPAP.  During AF he reports symptoms of increased urination, palpitations, and weakness.  His exercise tolerance is much reduced in AF.  His afib burden remains controlled.  He does not wish to make changes today. Today, he denies symptoms of chest pain, shortness of breath, orthopnea, PND, lower extremity edema, claudication, dizziness, presyncope, syncope, bleeding, or neurologic sequela. The patient is tolerating medications without difficulties and is otherwise without complaint today.    Past Medical History  Diagnosis Date  . Hypertension   . Atrial flutter     s/p ablation in March 2012  . Hyperlipidemia   . Vitamin D deficiency   . RBBB (right bundle branch block)   . BPH (benign prostatic hyperplasia)   . Osteoarthritis   . Anemia   . Peptic ulcer disease   . DVT (deep venous thrombosis) 2/12    in setting of R hip surgery, he developed R femoral DVT  . Chronic anticoagulation     on Xarelto for afib  . Abnormal nuclear cardiac imaging test 1/13    Abnormal stress test 05/2011, but cardiac cath 06/2011 showing normal coronary anatomy.  . Paroxysmal atrial fibrillation 9/12    a. documented by Dr Barrett Shell on office EKG 9/12. b. maintained on Tikosyn and Xarelto. c. s/p DCCV 10/2012.  Marland Kitchen Premature ventricular contraction   . Obstructive sleep apnea    compliant with CPAP.  managed by Dr Elsworth Soho.   Past Surgical History  Procedure Laterality Date  . Total hip arthroplasty  06/04/10    RIGHT HIP  . Total knee arthroplasty    . Transurethral resection of prostate    . Atrial flutter ablation  5/12    by JA  . Cardiac catheterization    . Cardioversion N/A 10/28/2012    Procedure: CARDIOVERSION;  Surgeon: Burnell Blanks, MD;  Location: [facility];  Service: Cardiovascular;  Laterality: N/A;  . Left heart catheterization with coronary angiogram N/A 06/07/2011    Procedure: LEFT HEART CATHETERIZATION WITH CORONARY ANGIOGRAM;  Surgeon: Peter M Martinique, MD;  Location: [facility] CATH LAB;  Service: Cardiovascular;  Laterality: N/A;     Current Outpatient Prescriptions  Medication Sig Dispense Refill  . atorvastatin (LIPITOR) 10 MG tablet Take 1 tablet (10 mg total) by mouth daily. 90 tablet 1  . Cholecalciferol (VITAMIN D3) 5000 UNITS TABS Take 5,000 Units by mouth as directed. Pt takes 1 tablet by mouth every 4 days    . clindamycin (CLEOCIN T) 1 % external solution Apply 1 application topically daily as needed (for bumps in scalp). Apply to scalp daily as needed  2  . Docusate Calcium (STOOL SOFTENER PO) Take 1 capsule by mouth daily as needed (constipation).     . dofetilide (TIKOSYN) 500 MCG capsule TAKE 1 CAPSULE BY MOUTH EVERY 12 HOURS  180 capsule 3  . methocarbamol (ROBAXIN) 500 MG tablet Take 1 tablet (500 mg total) by mouth at bedtime as needed (pain). 30 tablet 3  . metoprolol succinate (TOPROL-XL) 50 MG 24 hr tablet Take 50 mg by mouth in the morning and take 25 mg by mouth in the evening. Take with or immediately following a meal.    . metoprolol tartrate (LOPRESSOR) 25 MG tablet Take 25 mg by mouth daily as needed (AFIB).    . Multiple Vitamin (MULITIVITAMIN) LIQD Take 5 mLs by mouth daily.    . Multiple Vitamins-Minerals (ICAPS PO) Take 1 tablet by mouth daily.     . Omega-3 Fatty Acids (OMEGA 3 PO) Take 1 capsule by mouth daily.     Marland Kitchen  omeprazole (PRILOSEC) 20 MG capsule Take 20 mg by mouth as directed. Pt takes 1 tablet by mouth every 4 days    . OVER THE COUNTER MEDICATION Place 3 drops into both eyes daily as needed (dry eyes). "thera tears"    . potassium chloride SA (K-DUR,KLOR-CON) 20 MEQ tablet Take 2 tablets (80meq) by mouth in the morning and in the evening. 360 tablet 3  . rivaroxaban (XARELTO) 20 MG TABS tablet Take 1 tablet (20 mg total) by mouth daily. 90 tablet 3  . tamsulosin (FLOMAX) 0.4 MG CAPS capsule TAKE ONE CAPSULE BY MOUTH ONCE DAILY 30 MINUTES AFTER THE SAME MEAL 90 capsule 1  . traMADol (ULTRAM) 50 MG tablet Take 1 tablet (50 mg total) by mouth every 6 (six) hours as needed. For pain 60 tablet 2   Current Facility-Administered Medications  Medication Dose Route Frequency Provider Last Rate Last Dose  . pneumococcal 13-valent conjugate vaccine (PREVNAR 13) injection 0.5 mL  0.5 mL Intramuscular Tomorrow-1000 Elayne Snare, MD        Allergies:   Penicillins   Social History:  The patient  reports that he quit smoking about 36 years ago. His smoking use included Cigarettes. He has a 30 pack-year smoking history. He has never used smokeless tobacco. He reports that he drinks about 3.0 oz of alcohol per week. He reports that he does not use illicit drugs.   Family History:  The patient's family history includes Arrhythmia in his sister; Atrial fibrillation in his brother; Heart attack in his father; Lung disease in his mother. There is no history of Diabetes.    ROS:  Please see the history of present illness.   All other systems are reviewed and negative.    PHYSICAL EXAM: VS:  BP 124/68 mmHg  Pulse 59  Ht 6' (1.829 m)  Wt 102.694 kg (226 lb 6.4 oz)  BMI 30.70 kg/m2 , BMI Body mass index is 30.7 kg/(m^2). GEN: Well nourished, well developed, in no acute distress HEENT: normal Neck: no JVD, carotid bruits, or masses Cardiac: RRR; no murmurs, rubs, or gallops,no edema  Respiratory:  clear to  auscultation bilaterally, normal work of breathing GI: soft, nontender, nondistended, + BS MS: no deformity or atrophy Skin: warm and dry  Neuro:  Strength and sensation are intact Psych: euthymic mood, full affect  EKG:  EKG is ordered today. The ekg ordered today shows sinus rhythm 59 bpm, PR 166 msec, with RBBB, Qtc 495   Recent Labs: 09/16/2014: Magnesium 2.2 09/20/2014: ALT 27; BUN 15; Creatinine, Ser 0.80; Hemoglobin 15.1; Platelets 138.0*; Potassium 4.2; Sodium 140; TSH 1.20    Lipid Panel     Component Value Date/Time   CHOL 158 09/20/2014 1005   TRIG 90.0  09/20/2014 1005   HDL 50.80 09/20/2014 1005   CHOLHDL 3 09/20/2014 1005   VLDL 18.0 09/20/2014 1005   LDLCALC 89 09/20/2014 1005     Wt Readings from Last 3 Encounters:  11/14/14 102.694 kg (226 lb 6.4 oz)  09/20/14 103.42 kg (228 lb)  09/16/14 103.602 kg (228 lb 6.4 oz)      Other studies Reviewed: Additional studies/ records that were reviewed today include: echo 2014  Review of the above records today demonstrates: LA 40 mm   ASSESSMENT AND PLAN:  1.  Paroxysmal atrial fibrillation The patient has symptomatic recurrent afib.  Presently, he feels that his afib is reasonably controlled with tikosyn.  No changes at this time. chads2vasc score is at least 3.  Continue xarelto  2. OSA Compliance with CPAP is advised  3. HTN Stable No change required today  4. Nonischemic CM Per Dr Mare Ferrari  Current medicines are reviewed at length with the patient today.   The patient does not have concerns regarding his medicines.  The following changes were made today:  none  Follow-up with Dr Mare Ferrari as scheduled Follow-up with Roderic Palau NP in the AF clinic every 6 months and I will see when needed  Signed, [name], MD  11/14/2014 10:56 AM     [facility] [place] [id number] [phone] (office) [phone] (fax)

## 2014-11-24 ENCOUNTER — Encounter: Payer: Self-pay | Admitting: Dietician

## 2014-11-24 ENCOUNTER — Encounter: Payer: Medicare Other | Attending: Endocrinology | Admitting: Dietician

## 2014-11-24 VITALS — Ht 72.0 in | Wt 221.0 lb

## 2014-11-24 DIAGNOSIS — Z713 Dietary counseling and surveillance: Secondary | ICD-10-CM | POA: Insufficient documentation

## 2014-11-24 DIAGNOSIS — R7309 Other abnormal glucose: Secondary | ICD-10-CM | POA: Insufficient documentation

## 2014-11-24 DIAGNOSIS — R7303 Prediabetes: Secondary | ICD-10-CM

## 2014-11-24 NOTE — Patient Instructions (Signed)
Continue to exercise regularly.  This will help maintain your blood sugar and can help you lose weight.   Be mindful of portion sizes of carbohydrates, protein, and fat. Baked or grill rather than fry.

## 2014-11-25 NOTE — Progress Notes (Signed)
Diabetes Self-Management Education  Visit Type: First/Initial  Appt. Start Time: 1430 Appt. End Time: 2376  11/25/2014  [name], identified by name and date of birth, is a 81 y.o. male with a diagnosis of Diabetes: Pre-Diabetes.  Other people present during visit:  Patient, Spouse/SO (wife)  Patient with newly diagnosed Pre Diabetes with HgbA1C of 5.8% 09/20/14.    Hx includes OSA, HTN, GERD, A. Fib.  He reports that he lost to 200 lbs 5-6 years ago with weight watcher and gradually increased to current weight of 221 lbs with increased eating out.  Exercise varies due to A fib and he does not tolerate heat.  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  ASSESSMENT  Height 6' (1.829 m), weight 221 lb (100.245 kg). Body mass index is 29.97 kg/(m^2).  Initial Visit Information:  Are you currently following a meal plan?: No   Are you taking your medications as prescribed?: Yes Are you checking your feet?: No   How often do you need to have someone help you when you read instructions, pamphlets, or other written materials from your doctor or pharmacy?: 1 - Never What is the last grade level you completed in school?: 4 years college plus graduate work  Psychosocial:   Patient Belief/Attitude about Diabetes: Motivated to manage diabetes (Patient is motivated to avoid full blown diabetes) Self-care barriers: Hard of hearing (hearing aid and wife is present) Self-management support: Doctor's office, Family Other persons present: Patient, Spouse/SO (wife) Patient Concerns: Nutrition/Meal planning, Healthy Lifestyle, Weight Control (What do I need to avoid?) Special Needs: None Preferred Learning Style: No preference indicated Learning Readiness: Ready  Complications:   Last HgB A1C per patient/outside source: 5.8 mg/dL (09/20/14) How often do you check your blood sugar?: Not recommended by provider Have you had a dilated eye exam in the past 12 months?:  Yes Have you had a dental exam in the past 12 months?: Yes  Diet Intake:  Breakfast: breakfast bar OR muffin and decaf coffee with splenda OR 2 eggs, bacon, toast with butter/jam, bacon when traveling Snack (morning): none Lunch: tomato sandwich and chips OR leftovers OR occasional out to eat Hamburer and soft serve ice cream OR occasional lunch group:   1/2 Roast beef French dip or salad Snack (afternoon): none Dinner: meat, vegetables OR greek restaurant (steak and cheese or shrimp and salad), dessert a couple of times per week. Snack (evening): ice cream occasionally Beverage(s): decaf coffee with splenda, unsweetened iced tea, water, lemonade, chai tea each evening  Exercise:  Exercise: Light (walking / raking leaves) (gym:  15 minutes on bike, 15 minutes on treadmilk, plus resistance training, yard work/gardening ) Light Exercise amount of time (min / week): 150  Individualized Plan for Diabetes Self-Management Training:   Learning Objective:  Patient will have a greater understanding of diabetes self-management.  Patient education plan per assessed needs and concerns is to attend individual sessions for     Education Topics Reviewed with Patient Today:  Definition of diabetes, type 1 and 2, and the diagnosis of diabetes Role of diet in the treatment of diabetes and the relationship between the three main macronutrients and blood glucose level, Food label reading, portion sizes and measuring food. Role of exercise on diabetes management, blood pressure control and cardiac health.   Interpreting lab values - A1C, lipid, urine microalbumina.   Relationship between chronic complications and blood glucose control       PATIENTS GOALS/Plan (Developed by  the patient):  Nutrition: Follow meal plan discussed, General guidelines for healthy choices and portions discussed Physical Activity: Exercise 5-7 days per week, 30 minutes per day Medications: Not Applicable Monitoring : Not  Applicable  Plan:   Patient Instructions  Continue to exercise regularly.  This will help maintain your blood sugar and can help you lose weight.   Be mindful of portion sizes of carbohydrates, protein, and fat. Baked or grill rather than fry.   Expected Outcomes:  Demonstrated interest in learning. Expect positive outcomes  Education material provided: Living Well with Diabetes, Food label handouts and Meal plan card  If problems or questions, patient to contact team via:  Phone and Email  Future DSME appointment: PRN

## 2014-12-13 ENCOUNTER — Other Ambulatory Visit: Payer: Self-pay | Admitting: Cardiology

## 2015-01-11 ENCOUNTER — Other Ambulatory Visit: Payer: Self-pay | Admitting: *Deleted

## 2015-01-11 MED ORDER — METHOCARBAMOL 500 MG PO TABS: 500.0000 mg | ORAL_TABLET | Freq: Every evening | ORAL | Status: DC | PRN

## 2015-02-03 ENCOUNTER — Encounter: Payer: Self-pay | Admitting: Cardiology

## 2015-02-03 ENCOUNTER — Ambulatory Visit (INDEPENDENT_AMBULATORY_CARE_PROVIDER_SITE_OTHER): Payer: Medicare Other | Admitting: Cardiology

## 2015-02-03 VITALS — BP 112/72 | HR 86 | Ht 72.0 in | Wt 218.8 lb

## 2015-02-03 DIAGNOSIS — I480 Paroxysmal atrial fibrillation: Secondary | ICD-10-CM

## 2015-02-03 DIAGNOSIS — I519 Heart disease, unspecified: Secondary | ICD-10-CM | POA: Diagnosis not present

## 2015-02-03 DIAGNOSIS — I119 Hypertensive heart disease without heart failure: Secondary | ICD-10-CM | POA: Diagnosis not present

## 2015-02-03 LAB — BASIC METABOLIC PANEL
BUN: 16 mg/dL (ref 6–23)
CHLORIDE: 106 meq/L (ref 96–112)
CO2: 28 meq/L (ref 19–32)
CREATININE: 0.77 mg/dL (ref 0.40–1.50)
Calcium: 9.1 mg/dL (ref 8.4–10.5)
GFR: 103.6 mL/min (ref >60.00)
Glucose, Bld: 94 mg/dL (ref 70–99)
Potassium: 4.3 mEq/L (ref 3.5–5.1)
Sodium: 143 mEq/L (ref 135–145)

## 2015-02-03 LAB — MAGNESIUM: Magnesium: 2.1 mg/dL (ref 1.5–2.5)

## 2015-02-03 NOTE — Progress Notes (Signed)
Cardiology Office Note   Date:  02/03/2015   ID:  Connor [name], Connor [name] August 04, 1933, MRN [id number]  PCP:  Elayne Snare, MD  Cardiologist: Darlin Coco MD  No chief complaint on file.     History of Present Illness: Connor [name] is a 81 y.o. male who presents for scheduled follow-up visit.  He has a history of recurrent atrial fibrillation. He has had previous ablation procedure by Dr. Rayann Heman for atrial flutter. He is on long-term Xarelto for chronic anticoagulation. He has a history of high blood pressure and hypercholesterolemia. He has had a previous deep vein thrombosis in the setting of right hip surgery. He has less energy and has been more short of breath whenever he is not in sinus rhythm. The patient is now on Tikosyn. He converted after initial doses and has been on Tikosyn. He has been tolerating the Tikosyn well.  It has been 49 days since his last episode of atrial fibrillation.  The patient was hospitalized briefly on 10/27/12 after he fell and landed on his right chest. He was concerned that he might have fractured a rib and [facility] where he was noted to be in atrial fibrillation with rapid ventricular response. He was transferred to [facility] and the following day underwent direct-current cardioversion successfully and was discharged home later that same day. The patient brought in a diary of his episodes when he is in atrial fibrillation. Typically he will stay out of rhythm for about 12-18 hours and then go back into rhythm. He takes extra metoprolol on a when necessary basis. The patient has a history of obstructive sleep apnea and uses a CPAP machine 5-7 hours per night.   . His initial ablation procedure was for atrial flutter. Of note is the fact that his brother has had 3 ablation procedures for atrial fibrillation, all at [facility]. The patient had a recent echocardiogram on 06/01/14 which showed his ejection fraction to be 35-40%. The  patient denies any chest discomfort. He rides an exercise bike. He notes that his resting heart rate is slower now that he is in better overall condition. He goes to the gym 3 times a week.  He has been more careful with his diet and has intentionally lost 8 pounds since last visit.   Past Medical History  Diagnosis Date  . Hypertension   . Atrial flutter     s/p ablation in March 2012  . Hyperlipidemia   . Vitamin D deficiency   . RBBB (right bundle branch block)   . BPH (benign prostatic hyperplasia)   . Osteoarthritis   . Anemia   . Peptic ulcer disease   . DVT (deep venous thrombosis) 2/12    in setting of R hip surgery, he developed R femoral DVT  . Chronic anticoagulation     on Xarelto for afib  . Abnormal nuclear cardiac imaging test 1/13    Abnormal stress test 05/2011, but cardiac cath 06/2011 showing normal coronary anatomy.  . Paroxysmal atrial fibrillation 9/12    a. documented by Dr Barrett Shell on office EKG 9/12. b. maintained on Tikosyn and Xarelto. c. s/p DCCV 10/2012.  Marland Kitchen Premature ventricular contraction   . Obstructive sleep apnea     compliant with CPAP.  managed by Dr Elsworth Soho.  . Prediabetes     Past Surgical History  Procedure Laterality Date  . Total hip arthroplasty  06/04/10    RIGHT HIP  . Total knee arthroplasty    .  Transurethral resection of prostate    . Atrial flutter ablation  5/12    by JA  . Cardiac catheterization    . Cardioversion N/A 10/28/2012    Procedure: CARDIOVERSION;  Surgeon: Burnell Blanks, MD;  Location: [facility];  Service: Cardiovascular;  Laterality: N/A;  . Left heart catheterization with coronary angiogram N/A 06/07/2011    Procedure: LEFT HEART CATHETERIZATION WITH CORONARY ANGIOGRAM;  Surgeon: Peter M Martinique, MD;  [facility];  Service: Cardiovascular;  Laterality: N/A;     Current Outpatient Prescriptions  Medication Sig Dispense Refill  . atorvastatin (LIPITOR) 10 MG tablet Take 1 tablet (10 mg total) by mouth  daily. 90 tablet 1  . Cholecalciferol (VITAMIN D3) 5000 UNITS TABS Take 5,000 Units by mouth as directed. Pt takes 1 tablet by mouth every 4 days    . clindamycin (CLEOCIN T) 1 % external solution Apply 1 application topically daily as needed (for bumps in scalp). Apply to scalp daily as needed  2  . Docusate Calcium (STOOL SOFTENER PO) Take 1 capsule by mouth daily as needed (constipation).     . dofetilide (TIKOSYN) 500 MCG capsule TAKE 1 CAPSULE BY MOUTH EVERY 12 HOURS 180 capsule 3  . methocarbamol (ROBAXIN) 500 MG tablet Take 1 tablet (500 mg total) by mouth at bedtime as needed (pain). 30 tablet 3  . metoprolol succinate (TOPROL-XL) 50 MG 24 hr tablet Take 50 mg by mouth in the morning and take 25 mg by mouth in the evening. Take with or immediately following a meal.    . metoprolol tartrate (LOPRESSOR) 25 MG tablet Take 25 mg by mouth daily as needed (AFIB).    . Multiple Vitamin (MULITIVITAMIN) LIQD Take 5 mLs by mouth daily.    . Multiple Vitamins-Minerals (ICAPS PO) Take 1 tablet by mouth daily.     . Omega-3 Fatty Acids (OMEGA 3 PO) Take 1 capsule by mouth daily.     Marland Kitchen omeprazole (PRILOSEC) 20 MG capsule Take 20 mg by mouth as directed. Pt takes 1 tablet by mouth every 4 days    . OVER THE COUNTER MEDICATION Place 3 drops into both eyes daily as needed (dry eyes). "thera tears"    . potassium chloride SA (K-DUR,KLOR-CON) 20 MEQ tablet Take 2 tablets (22meq) by mouth in the morning and in the evening. 360 tablet 3  . rivaroxaban (XARELTO) 20 MG TABS tablet Take 1 tablet (20 mg total) by mouth daily. 90 tablet 3  . tamsulosin (FLOMAX) 0.4 MG CAPS capsule TAKE ONE CAPSULE BY MOUTH ONCE DAILY 30 MINUTES AFTER THE SAME MEAL 90 capsule 1  . traMADol (ULTRAM) 50 MG tablet Take 50 mg by mouth every 6 (six) hours as needed (pain).     Current Facility-Administered Medications  Medication Dose Route Frequency Provider Last Rate Last Dose  . pneumococcal 13-valent conjugate vaccine (PREVNAR 13)  injection 0.5 mL  0.5 mL Intramuscular Tomorrow-1000 Elayne Snare, MD        Allergies:   Penicillins    Social History:  The patient  reports that he quit smoking about 36 years ago. His smoking use included Cigarettes. He has a 30 pack-year smoking history. He has never used smokeless tobacco. He reports that he drinks about 3.0 oz of alcohol per week. He reports that he does not use illicit drugs.   Family History:  The patient's family history includes Arrhythmia in his sister; Atrial fibrillation in his brother; Heart attack in his father; Lung disease in  his mother. There is no history of Diabetes.    ROS:  Please see the history of present illness.   Otherwise, review of systems are positive for none.   All other systems are reviewed and negative.    PHYSICAL EXAM: VS:  BP 112/72 mmHg  Pulse 86  Ht 6' (1.829 m)  Wt 218 lb 12.8 oz (99.247 kg)  BMI 29.67 kg/m2 , BMI Body mass index is 29.67 kg/(m^2). GEN: Well nourished, well developed, in no acute distress HEENT: normal Neck: no JVD, carotid bruits, or masses Cardiac: RRR; no murmurs, rubs, or gallops,no edema  Respiratory:  clear to auscultation bilaterally, normal work of breathing GI: soft, nontender, nondistended, + BS MS: no deformity or atrophy Skin: warm and dry, no rash Neuro:  Strength and sensation are intact Psych: euthymic mood, full affect   EKG:  EKG is not ordered today.   Recent Labs: 09/20/2014: ALT 27; Hemoglobin 15.1; Platelets 138.0*; TSH 1.20 02/03/2015: BUN 16; Creatinine, Ser 0.77; Magnesium 2.1; Potassium 4.3; Sodium 143    Lipid Panel    Component Value Date/Time   CHOL 158 09/20/2014 1005   TRIG 90.0 09/20/2014 1005   HDL 50.80 09/20/2014 1005   CHOLHDL 3 09/20/2014 1005   VLDL 18.0 09/20/2014 1005   LDLCALC 89 09/20/2014 1005      Wt Readings from Last 3 Encounters:  02/03/15 218 lb 12.8 oz (99.247 kg)  11/24/14 221 lb (100.245 kg)  11/14/14 226 lb 6.4 oz (102.694 kg)         ASSESSMENT AND PLAN:  1. Paroxysmal atrial fibrillation The patient has symptomatic recurrent afib. Presently, he feels that his afib is reasonably controlled with tikosyn.  He has not had any atrial fibrillation for the past 49 days. No changes at this time. chads2vasc score is at least 3. Continue xarelto  2. OSA Compliance with CPAP is advised  3. HTN Stable No change required today  4. Nonischemic CM Continue current medication   Current medicines are reviewed at length with the patient today.  The patient does not have concerns regarding medicines.  The following changes have been made:  no change  Labs/ tests ordered today include:  No orders of the defined types were placed in this encounter.   Disposition: Continue current medication.  Recheck in 4 months for office visit basal metabolic panel and magnesium level.  Also EKG.  Berna Spare MD 02/03/2015 [facility] [place], [place], [place]  [address] Phone: [phone]; Fax: [phone]

## 2015-02-03 NOTE — Patient Instructions (Addendum)
**Note De-Identified Michelle Wnek Obfuscation** Medication Instructions:  Your physician recommends that you continue on your current medications as directed. Please refer to the Current Medication list given to you today.  Labwork: bmet/magnesium  Testing/Procedures: none  Follow-Up: Your physician recommends that you schedule a follow-up appointment in: 4 month ov/bmet/magnesium with Tera Helper NP

## 2015-02-03 NOTE — Progress Notes (Signed)
Quick Note:  Please report to patient. The recent labs are stable. Continue same medication and careful diet. ______ 

## 2015-02-06 ENCOUNTER — Other Ambulatory Visit: Payer: Self-pay | Admitting: Chiropractic Medicine

## 2015-02-06 ENCOUNTER — Ambulatory Visit
Admission: RE | Admit: 2015-02-06 | Discharge: 2015-02-06 | Disposition: A | Discharge status: Home / Self Care | Payer: Medicare Other | Source: Ambulatory Visit | Attending: Chiropractic Medicine | Admitting: Chiropractic Medicine

## 2015-02-06 DIAGNOSIS — M542 Cervicalgia: Secondary | ICD-10-CM

## 2015-02-14 ENCOUNTER — Other Ambulatory Visit: Payer: Self-pay | Admitting: Cardiology

## 2015-02-15 ENCOUNTER — Other Ambulatory Visit: Payer: Self-pay | Admitting: *Deleted

## 2015-02-15 MED ORDER — TRAMADOL HCL 50 MG PO TABS: 50.0000 mg | ORAL_TABLET | Freq: Four times a day (QID) | ORAL | Status: DC | PRN

## 2015-02-26 ENCOUNTER — Other Ambulatory Visit: Payer: Self-pay | Admitting: Cardiology

## 2015-03-21 ENCOUNTER — Encounter: Payer: Self-pay | Admitting: Adult Health

## 2015-03-21 ENCOUNTER — Ambulatory Visit (INDEPENDENT_AMBULATORY_CARE_PROVIDER_SITE_OTHER): Payer: Medicare Other | Admitting: Adult Health

## 2015-03-21 VITALS — BP 110/66 | HR 64 | Temp 98.6°F | Ht 72.0 in | Wt 228.0 lb

## 2015-03-21 DIAGNOSIS — G4733 Obstructive sleep apnea (adult) (pediatric): Secondary | ICD-10-CM | POA: Diagnosis not present

## 2015-03-21 NOTE — Progress Notes (Signed)
Reviewed & agree with plan  

## 2015-03-21 NOTE — Assessment & Plan Note (Signed)
Well controlled on nocturnal C Pap  Plan  Continue on CPAP At bedtime   Goal is to wear each night for 6hrs .  Do not drive if sleepy.  Work on weight loss.  follow up Dr. Elsworth Soho  In 1 year and As needed

## 2015-03-21 NOTE — Patient Instructions (Signed)
Continue on CPAP At bedtime   Goal is to wear each night for 6hrs .  Do not drive if sleepy.  Work on weight loss.  follow up Dr. Elsworth Soho  In 1 year and As needed

## 2015-03-21 NOTE — Progress Notes (Signed)
Subjective:    Patient ID: Connor [name], male    DOB: May 21, 1933, 81 y.o.   MRN: [id number]  HPI 80 yo male with Moderate OSA   TEST  AHI 21/hr   03/21/2015 Follow up : OSA   Patient returns for a one-year follow-up Patient has moderate sleep apnea and is controlled on C Pap at bedtime.  He says that he does very well with his C Pap machine.  Feels rested  With no significant daytime sleepiness.  He says he uses his C Pap every night and averages about 5 hours.  He denies any mask issues  Download from October 15 of March 19 2015 shows excellent compliance with average usage at 5 hours on a set pressure of 11 cm H2O.  Minimal leaks AHI of 3.0  He denies any chest pain orthopnea, PND or leg swelling    Past Medical History  Diagnosis Date  . Hypertension   . Atrial flutter [place])     s/p ablation in March 2012  . Hyperlipidemia   . Vitamin D deficiency   . RBBB (right bundle branch block)   . BPH (benign prostatic hyperplasia)   . Osteoarthritis   . Anemia   . Peptic ulcer disease   . DVT (deep venous thrombosis) ([place]) 2/12    in setting of R hip surgery, he developed R femoral DVT  . Chronic anticoagulation     on Xarelto for afib  . Abnormal nuclear cardiac imaging test 1/13    Abnormal stress test 05/2011, but cardiac cath 06/2011 showing normal coronary anatomy.  . Paroxysmal atrial fibrillation ([place]) 9/12    a. documented by Dr Barrett Shell on office EKG 9/12. b. maintained on Tikosyn and Xarelto. c. s/p DCCV 10/2012.  Marland Kitchen Premature ventricular contraction   . Obstructive sleep apnea     compliant with CPAP.  managed by Dr Elsworth Soho.  . Prediabetes    Current Outpatient Prescriptions on File Prior to Visit  Medication Sig Dispense Refill  . atorvastatin (LIPITOR) 10 MG tablet Take 1 tablet (10 mg total) by mouth daily. 90 tablet 1  . Cholecalciferol (VITAMIN D3) 5000 UNITS TABS Take 5,000 Units by mouth as directed. Pt takes 1 tablet by mouth every 4 days    . clindamycin  (CLEOCIN T) 1 % external solution Apply 1 application topically daily as needed (for bumps in scalp). Apply to scalp daily as needed  2  . dofetilide (TIKOSYN) 500 MCG capsule TAKE 1 CAPSULE BY MOUTH EVERY 12 HOURS 180 capsule 3  . methocarbamol (ROBAXIN) 500 MG tablet Take 1 tablet (500 mg total) by mouth at bedtime as needed (pain). 30 tablet 3  . metoprolol succinate (TOPROL-XL) 50 MG 24 hr tablet Take 50 mg by mouth in the morning and take 25 mg by mouth in the evening. Take with or immediately following a meal.    . metoprolol tartrate (LOPRESSOR) 25 MG tablet TAKE 1 TABLET EVERY 6 HOURS AS NEEDED FOR FAST HEART RATES 30 tablet 3  . Multiple Vitamin (MULITIVITAMIN) LIQD Take 5 mLs by mouth daily.    . Multiple Vitamins-Minerals (ICAPS PO) Take 1 tablet by mouth daily.     . Omega-3 Fatty Acids (OMEGA 3 PO) Take 1 capsule by mouth daily.     Marland Kitchen omeprazole (PRILOSEC) 20 MG capsule Take 20 mg by mouth as directed. Pt takes 1 tablet by mouth every 4 days    . OVER THE COUNTER MEDICATION Place 3 drops into both  eyes daily as needed (dry eyes). "thera tears"    . potassium chloride SA (K-DUR,KLOR-CON) 20 MEQ tablet Take 2 tablets (72meq) by mouth in the morning and in the evening. 360 tablet 3  . rivaroxaban (XARELTO) 20 MG TABS tablet Take 1 tablet (20 mg total) by mouth daily. 90 tablet 3  . tamsulosin (FLOMAX) 0.4 MG CAPS capsule TAKE ONE CAPSULE BY MOUTH ONCE DAILY 30 MINUTES AFTER THE SAME MEAL 90 capsule 1  . traMADol (ULTRAM) 50 MG tablet Take 1 tablet (50 mg total) by mouth every 6 (six) hours as needed (pain). 60 tablet 5  . Docusate Calcium (STOOL SOFTENER PO) Take 1 capsule by mouth daily as needed (constipation).      Current Facility-Administered Medications on File Prior to Visit  Medication Dose Route Frequency Provider Last Rate Last Dose  . pneumococcal 13-valent conjugate vaccine (PREVNAR 13) injection 0.5 mL  0.5 mL Intramuscular Tomorrow-1000 Elayne Snare, MD         Review of  Systems  Constitutional:   No  weight loss, night sweats,  Fevers, chills, fatigue, or  lassitude.  HEENT:   No headaches,  Difficulty swallowing,  Tooth/dental problems, or  Sore throat,                No sneezing, itching, ear ache, nasal congestion, post nasal drip,   CV:  No chest pain,  Orthopnea, PND, swelling in lower extremities, anasarca, dizziness, palpitations, syncope.   GI  No heartburn, indigestion, abdominal pain, nausea, vomiting, diarrhea, change in bowel habits, loss of appetite, bloody stools.   Resp: No shortness of breath with exertion or at rest.  No excess mucus, no productive cough,  No non-productive cough,  No coughing up of blood.  No change in color of mucus.  No wheezing.  No chest wall deformity  Skin: no rash or lesions.  GU: no dysuria, change in color of urine, no urgency or frequency.  No flank pain, no hematuria   MS:  No joint pain or swelling.  No decreased range of motion.  No back pain.  Psych:  No change in mood or affect. No depression or anxiety.  No memory loss.          Objective:   Physical Exam  GEN: A/Ox3; pleasant , NAD, overweight   VS reviewed   HEENT:  [place]/AT,  EACs-clear, TMs-wnl, NOSE-clear, THROAT-clear, no lesions, no postnasal drip or exudate noted. . Class 2-3 MP airway   NECK:  Supple w/ fair ROM; no JVD; normal carotid impulses w/o bruits; no thyromegaly or nodules palpated; no lymphadenopathy.  RESP  Clear  P & A; w/o, wheezes/ rales/ or rhonchi.no accessory muscle use, no dullness to percussion  CARD:  RRR, no m/r/g  , no peripheral edema, pulses intact, no cyanosis or clubbing.  GI:   Soft & nt; nml bowel sounds; no organomegaly or masses detected.  Musco: Warm bil, no deformities or joint swelling noted.   Neuro: alert, no focal deficits noted.    Skin: Warm, no lesions or rashes        Assessment & Plan:

## 2015-03-26 ENCOUNTER — Other Ambulatory Visit: Payer: Self-pay | Admitting: Cardiology

## 2015-03-27 ENCOUNTER — Encounter: Payer: Self-pay | Admitting: Cardiology

## 2015-03-29 ENCOUNTER — Other Ambulatory Visit: Payer: Self-pay | Admitting: Cardiology

## 2015-03-29 ENCOUNTER — Other Ambulatory Visit: Payer: Self-pay | Admitting: Endocrinology

## 2015-04-04 ENCOUNTER — Encounter: Payer: Self-pay | Admitting: Endocrinology

## 2015-04-05 ENCOUNTER — Encounter: Payer: Self-pay | Admitting: Adult Health

## 2015-04-15 ENCOUNTER — Other Ambulatory Visit: Payer: Self-pay | Admitting: Endocrinology

## 2015-05-07 DIAGNOSIS — Z9989 Dependence on other enabling machines and devices: Secondary | ICD-10-CM

## 2015-05-07 DIAGNOSIS — G4733 Obstructive sleep apnea (adult) (pediatric): Secondary | ICD-10-CM

## 2015-05-07 HISTORY — DX: Dependence on other enabling machines and devices: Z99.89

## 2015-05-07 HISTORY — DX: Obstructive sleep apnea (adult) (pediatric): G47.33

## 2015-05-15 ENCOUNTER — Inpatient Hospital Stay (HOSPITAL_COMMUNITY): Admission: RE | Admit: 2015-05-15 | Payer: Medicare Other | Source: Ambulatory Visit | Admitting: Nurse Practitioner

## 2015-05-25 ENCOUNTER — Ambulatory Visit (HOSPITAL_COMMUNITY)
Admission: RE | Admit: 2015-05-25 | Discharge: 2015-05-25 | Disposition: A | Discharge status: Home / Self Care | Payer: Medicare Other | Source: Ambulatory Visit | Attending: Nurse Practitioner | Admitting: Nurse Practitioner

## 2015-05-25 ENCOUNTER — Encounter (HOSPITAL_COMMUNITY): Payer: Self-pay | Admitting: Nurse Practitioner

## 2015-05-25 VITALS — BP 132/76 | HR 57 | Ht 72.0 in | Wt 227.2 lb

## 2015-05-25 DIAGNOSIS — R7303 Prediabetes: Secondary | ICD-10-CM | POA: Insufficient documentation

## 2015-05-25 DIAGNOSIS — Z86718 Personal history of other venous thrombosis and embolism: Secondary | ICD-10-CM | POA: Insufficient documentation

## 2015-05-25 DIAGNOSIS — Z880 Allergy status to penicillin: Secondary | ICD-10-CM | POA: Diagnosis not present

## 2015-05-25 DIAGNOSIS — Z7902 Long term (current) use of antithrombotics/antiplatelets: Secondary | ICD-10-CM | POA: Insufficient documentation

## 2015-05-25 DIAGNOSIS — E559 Vitamin D deficiency, unspecified: Secondary | ICD-10-CM | POA: Insufficient documentation

## 2015-05-25 DIAGNOSIS — E785 Hyperlipidemia, unspecified: Secondary | ICD-10-CM | POA: Diagnosis not present

## 2015-05-25 DIAGNOSIS — I4510 Unspecified right bundle-branch block: Secondary | ICD-10-CM | POA: Diagnosis not present

## 2015-05-25 DIAGNOSIS — Z96659 Presence of unspecified artificial knee joint: Secondary | ICD-10-CM | POA: Diagnosis not present

## 2015-05-25 DIAGNOSIS — I10 Essential (primary) hypertension: Secondary | ICD-10-CM | POA: Diagnosis not present

## 2015-05-25 DIAGNOSIS — I4819 Other persistent atrial fibrillation: Secondary | ICD-10-CM

## 2015-05-25 DIAGNOSIS — Z8249 Family history of ischemic heart disease and other diseases of the circulatory system: Secondary | ICD-10-CM | POA: Diagnosis not present

## 2015-05-25 DIAGNOSIS — Z79899 Other long term (current) drug therapy: Secondary | ICD-10-CM | POA: Insufficient documentation

## 2015-05-25 DIAGNOSIS — Z87891 Personal history of nicotine dependence: Secondary | ICD-10-CM | POA: Diagnosis not present

## 2015-05-25 DIAGNOSIS — G4733 Obstructive sleep apnea (adult) (pediatric): Secondary | ICD-10-CM | POA: Diagnosis not present

## 2015-05-25 DIAGNOSIS — R5382 Chronic fatigue, unspecified: Secondary | ICD-10-CM | POA: Insufficient documentation

## 2015-05-25 DIAGNOSIS — Z96641 Presence of right artificial hip joint: Secondary | ICD-10-CM | POA: Insufficient documentation

## 2015-05-25 DIAGNOSIS — I481 Persistent atrial fibrillation: Secondary | ICD-10-CM | POA: Insufficient documentation

## 2015-05-25 LAB — BASIC METABOLIC PANEL
Anion gap: 6 (ref 5–15)
BUN: 14 mg/dL (ref 6–20)
CHLORIDE: 108 mmol/L (ref 101–111)
CO2: 28 mmol/L (ref 22–32)
Calcium: 8.9 mg/dL (ref 8.9–10.3)
Creatinine, Ser: 0.72 mg/dL (ref 0.61–1.24)
GFR calc Af Amer: >60 mL/min (ref >60)
GFR calc non Af Amer: >60 mL/min (ref >60)
GLUCOSE: 111 mg/dL — AB (ref 65–99)
POTASSIUM: 4.5 mmol/L (ref 3.5–5.1)
Sodium: 142 mmol/L (ref 135–145)

## 2015-05-25 LAB — CBC
HEMATOCRIT: 43.9 % (ref 39.0–52.0)
HEMOGLOBIN: 14.8 g/dL (ref 13.0–17.0)
MCH: 34.2 pg — AB (ref 26.0–34.0)
MCHC: 33.7 g/dL (ref 30.0–36.0)
MCV: 101.4 fL — AB (ref 78.0–100.0)
Platelets: 100 10*3/uL — ABNORMAL LOW (ref 150–400)
RBC: 4.33 MIL/uL (ref 4.22–5.81)
RDW: 13.6 % (ref 11.5–15.5)
WBC: 4 10*3/uL (ref 4.0–10.5)

## 2015-05-25 LAB — MAGNESIUM: Magnesium: 2 mg/dL (ref 1.7–2.4)

## 2015-05-25 NOTE — Progress Notes (Signed)
Patient ID: GENE WEATHERBY, male   DOB: 04-01-1934, 82 y.o.   MRN: [id number]     Primary Care Physician: Elayne Snare, MD Referring Physician: Dr.  [name] AHKING PRETZER is a 82 y.o. male with a h/o persistent afib, on tikosyn. He keeps charts that document his afib. He is usually In afib for less than 48 hours and will go long periods of time afib free. Back in the fall he had 60 days afib free but that spell was broken at Christmas when he had eggnog and believes there was more "nog" than egg. On previous visits with Dr. Rayann Heman, he was given the option of ablation vrs amiodarone but for now he prefers to stay the course with tikosyn. He also c/o of chronic fatigue. Discussed changing torpoll to 25 mg in am(from 50 mg ) and continuing with 25 mg pm, but he is hesitant to change anything that may increase afib burden or aggravate BP control. QTc is table on tikosyn.  Today, he denies symptoms of palpitations, chest pain, shortness of breath, orthopnea, PND, lower extremity edema, dizziness, presyncope, syncope, or neurologic sequela. The patient is tolerating medications without difficulties and is otherwise without complaint today.   Past Medical History  Diagnosis Date  . Hypertension   . Atrial flutter [facility])     s/p ablation in March 2012  . Hyperlipidemia   . Vitamin D deficiency   . RBBB (right bundle branch block)   . BPH (benign prostatic hyperplasia)   . Osteoarthritis   . Anemia   . Peptic ulcer disease   . DVT (deep venous thrombosis) ([place]) 2/12    in setting of R hip surgery, he developed R femoral DVT  . Chronic anticoagulation     on Xarelto for afib  . Abnormal nuclear cardiac imaging test 1/13    Abnormal stress test 05/2011, but cardiac cath 06/2011 showing normal coronary anatomy.  . Paroxysmal atrial fibrillation ([place]) 9/12    a. documented by Dr Barrett Shell on office EKG 9/12. b. maintained on Tikosyn and Xarelto. c. s/p DCCV 10/2012.  Marland Kitchen Premature ventricular contraction     . Obstructive sleep apnea     compliant with CPAP.  managed by Dr Elsworth Soho.  . Prediabetes    Past Surgical History  Procedure Laterality Date  . Total hip arthroplasty  06/04/10    RIGHT HIP  . Total knee arthroplasty    . Transurethral resection of prostate    . Atrial flutter ablation  5/12    by JA  . Cardiac catheterization    . Cardioversion N/A 10/28/2012    Procedure: CARDIOVERSION;  Surgeon: Burnell Blanks, MD;  Location: [facility];  Service: Cardiovascular;  Laterality: N/A;  . Left heart catheterization with coronary angiogram N/A 06/07/2011    Procedure: LEFT HEART CATHETERIZATION WITH CORONARY ANGIOGRAM;  Surgeon: Peter M Martinique, MD;  Location: [facility] CATH LAB;  Service: Cardiovascular;  Laterality: N/A;    Current Outpatient Prescriptions  Medication Sig Dispense Refill  . atorvastatin (LIPITOR) 10 MG tablet TAKE 1 TABLET (10 MG TOTAL) BY MOUTH DAILY. 90 tablet 1  . Cholecalciferol (VITAMIN D3) 5000 UNITS TABS Take 5,000 Units by mouth as directed. Pt takes 1 tablet by mouth every 4 days    . clindamycin (CLEOCIN T) 1 % external solution Apply 1 application topically daily as needed (for bumps in scalp). Apply to scalp daily as needed  2  . Docusate Calcium (STOOL SOFTENER PO) Take 1 capsule by  mouth daily as needed (constipation).     Marland Kitchen KLOR-CON M20 20 MEQ tablet TAKE 2 TABLETS (40MEQ) BY MOUTH IN THE MORNING AND 2 TABLETS (40MEQ) IN THE EVENING. 360 tablet 1  . methocarbamol (ROBAXIN) 500 MG tablet Take 1 tablet (500 mg total) by mouth at bedtime as needed (pain). 30 tablet 3  . metoprolol succinate (TOPROL-XL) 50 MG 24 hr tablet Take 50 mg by mouth in the morning and take 25 mg by mouth in the evening. Take with or immediately following a meal.    . metoprolol tartrate (LOPRESSOR) 25 MG tablet TAKE 1 TABLET EVERY 6 HOURS AS NEEDED FOR FAST HEART RATES 30 tablet 3  . Multiple Vitamin (MULITIVITAMIN) LIQD Take 5 mLs by mouth daily.    . Multiple Vitamins-Minerals (ICAPS PO)  Take 1 tablet by mouth daily.     . Omega-3 Fatty Acids (OMEGA 3 PO) Take 1 capsule by mouth daily.     Marland Kitchen omeprazole (PRILOSEC) 20 MG capsule Take 20 mg by mouth as directed. Pt takes 1 tablet by mouth every 4 days    . OVER THE COUNTER MEDICATION Place 3 drops into both eyes daily as needed (dry eyes). "thera tears"    . potassium chloride SA (K-DUR,KLOR-CON) 20 MEQ tablet Take 2 tablets (8meq) by mouth in the morning and in the evening. 360 tablet 3  . rivaroxaban (XARELTO) 20 MG TABS tablet Take 1 tablet (20 mg total) by mouth daily. 90 tablet 3  . tamsulosin (FLOMAX) 0.4 MG CAPS capsule TAKE ONE CAPSULE BY MOUTH ONCE DAILY 30 MINUTES AFTER THE SAME MEAL 90 capsule 1  . traMADol (ULTRAM) 50 MG tablet Take 1 tablet (50 mg total) by mouth every 6 (six) hours as needed (pain). 60 tablet 5  . TIKOSYN 500 MCG capsule TAKE 1 CAPSULE BY MOUTH EVERY 12 HOURS 180 capsule 2   Current Facility-Administered Medications  Medication Dose Route Frequency Provider Last Rate Last Dose  . pneumococcal 13-valent conjugate vaccine (PREVNAR 13) injection 0.5 mL  0.5 mL Intramuscular Tomorrow-1000 Elayne Snare, MD        Allergies  Allergen Reactions  . Penicillins Itching and Swelling    Social History   Social History  . Marital Status: Married    Spouse Name: N/A  . Number of Children: N/A  . Years of Education: N/A   Occupational History  . Not on file.   Social History Main Topics  . Smoking status: Former Smoker -- 1.00 packs/day for 30 years    Types: Cigarettes    Quit date: 05/06/1978  . Smokeless tobacco: Never Used  . Alcohol Use: 3.0 oz/week    5 Cans of beer per week     Comment: he has just about quit  . Drug Use: No  . Sexual Activity: Not on file   Other Topics Concern  . Not on file   Social History Narrative   he patient lives in [place] with his spouse.  Retired.    Family History  Problem Relation Age of Onset  . Heart attack Father   . Lung disease Mother   .  Atrial fibrillation Brother   . Arrhythmia Sister   . Diabetes Neg Hx     ROS- All systems are reviewed and negative except as per the HPI above  Physical Exam: Filed Vitals:   05/25/15 1002  BP: 132/76  Pulse: 57  Height: 6' (1.829 m)  Weight: 227 lb 3.2 oz (103.057 kg)    GEN-  The patient is well appearing, alert and oriented x 3 today.   Head- normocephalic, atraumatic Eyes-  Sclera clear, conjunctiva pink Ears- hearing intact Oropharynx- clear Neck- supple, no JVP Lymph- no cervical lymphadenopathy Lungs- Clear to ausculation bilaterally, normal work of breathing Heart- Regular rate and rhythm, no murmurs, rubs or gallops, PMI not laterally displaced GI- soft, NT, ND, + BS Extremities- no clubbing, cyanosis, or edema MS- no significant deformity or atrophy Skin- no rash or lesion Psych- euthymic mood, full affect Neuro- strength and sensation are intact  EKG- Sinus brady at  57 bpm, pr int 160 ms, qrs itn 136 ms, qtc 488 ms(qtc stable) Epic records reviewed  Assessment and Plan: 1. Persistent afib Has some afib burden but is content with current treatment Continue tikosyn, metoprolol Continue [employer], [place], cbc today  2. Fatigue  May be multifactorial but may be related to BB Suggested decreasing am dose of metoprolol to 25 am instead of 50 mg He prefers to stay the course for now  3. OAS Wearing cpap  F/u in 3 months  Butch Penny C. Robbie Nangle, [facility] [address] [place], [place] [address] [phone]

## 2015-06-06 ENCOUNTER — Ambulatory Visit (INDEPENDENT_AMBULATORY_CARE_PROVIDER_SITE_OTHER): Payer: Medicare Other | Admitting: Nurse Practitioner

## 2015-06-06 ENCOUNTER — Telehealth: Payer: Self-pay | Admitting: *Deleted

## 2015-06-06 ENCOUNTER — Encounter: Payer: Self-pay | Admitting: Nurse Practitioner

## 2015-06-06 VITALS — BP 130/74 | HR 68 | Ht 72.0 in | Wt 227.1 lb

## 2015-06-06 DIAGNOSIS — I10 Essential (primary) hypertension: Secondary | ICD-10-CM

## 2015-06-06 DIAGNOSIS — I519 Heart disease, unspecified: Secondary | ICD-10-CM | POA: Diagnosis not present

## 2015-06-06 DIAGNOSIS — I480 Paroxysmal atrial fibrillation: Secondary | ICD-10-CM | POA: Diagnosis not present

## 2015-06-06 DIAGNOSIS — E785 Hyperlipidemia, unspecified: Secondary | ICD-10-CM | POA: Diagnosis not present

## 2015-06-06 NOTE — Progress Notes (Signed)
CARDIOLOGY OFFICE NOTE  Date:  06/06/2015    [name] Date of Birth: 26-Nov-1933 Medical Record [id number]  PCP:  Elayne Snare, MD  Cardiologist:  Mare Ferrari - would like to establish with Dr. Acie Fredrickson going forward    Chief Complaint  Patient presents with  . Cardiomyopathy  . Atrial Fibrillation  . Hypertension    4 month check - seen for Dr. Mare Ferrari    History of Present Illness: Connor [name] is a 82 y.o. male who presents today for a follow up visit. Seen for Dr. Mare Ferrari.   He has a history of symptomatic PAF and has had previous ablation procedure by Dr. Rayann Heman for atrial flutter. He remains on long-term Xarelto for chronic anticoagulation. He has a history of high blood pressure and hypercholesterolemia. He has had a previous deep vein thrombosis in the setting of right hip surgery. The patient was hospitalized briefly on 10/27/12 after he fell and landed on his right chest. He was concerned that he might have fractured a rib and went to the ER where he was noted to be in atrial fibrillation with rapid ventricular response. He was transferred to [facility] and the following day underwent direct-current cardioversion successfully and was discharged home later that same day. Other issues include OSA - on CPAP, HTN, RBBB, BPH, prediabetes and PUD. Echo from 2016 showed his ejection fraction to be 35-40%.  Last seen here in September - felt to be doing well.   Saw Roderic Palau just a few days ago - stable visit. Still with breakthru arrhythmia. Labs done and were stable.   Comes back today. Here alone. He continues to do well. Continues to have symptomatic breakthru AF. He is planning on having colonoscopy in late February - was told to hold Xarelto for 3 days prior - he is worried about what to do if he goes in AF. No chest pain. Breathing is good. Remains active. He is happy with how he is doing.   Past Medical History  Diagnosis Date  . Hypertension   .  Atrial flutter [facility])     s/p ablation in March 2012  . Hyperlipidemia   . Vitamin D deficiency   . RBBB (right bundle branch block)   . BPH (benign prostatic hyperplasia)   . Osteoarthritis   . Anemia   . Peptic ulcer disease   . DVT (deep venous thrombosis) ([place]) 2/12    in setting of R hip surgery, he developed R femoral DVT  . Chronic anticoagulation     on Xarelto for afib  . Abnormal nuclear cardiac imaging test 1/13    Abnormal stress test 05/2011, but cardiac cath 06/2011 showing normal coronary anatomy.  . Paroxysmal atrial fibrillation ([place]) 9/12    a. documented by Dr Barrett Shell on office EKG 9/12. b. maintained on Tikosyn and Xarelto. c. s/p DCCV 10/2012.  Marland Kitchen Premature ventricular contraction   . Obstructive sleep apnea     compliant with CPAP.  managed by Dr Elsworth Soho.  . Prediabetes     Past Surgical History  Procedure Laterality Date  . Total hip arthroplasty  06/04/10    RIGHT HIP  . Total knee arthroplasty    . Transurethral resection of prostate    . Atrial flutter ablation  5/12    by JA  . Cardiac catheterization    . Cardioversion N/A 10/28/2012    Procedure: CARDIOVERSION;  Surgeon: Burnell Blanks, MD;  Location: [facility];  Service: Cardiovascular;  Laterality: N/A;  . Left heart catheterization with coronary angiogram N/A 06/07/2011    Procedure: LEFT HEART CATHETERIZATION WITH CORONARY ANGIOGRAM;  Surgeon: Peter M Martinique, MD;  Location: [facility] CATH LAB;  Service: Cardiovascular;  Laterality: N/A;     Medications: Current Outpatient Prescriptions  Medication Sig Dispense Refill  . atorvastatin (LIPITOR) 10 MG tablet TAKE 1 TABLET (10 MG TOTAL) BY MOUTH DAILY. 90 tablet 1  . Cholecalciferol (VITAMIN D3) 5000 UNITS TABS Take 5,000 Units by mouth as directed. Pt takes 1 tablet by mouth every 4 days    . clindamycin (CLEOCIN T) 1 % external solution Apply 1 application topically daily as needed (for bumps in scalp). Apply to scalp daily as needed  2  . Docusate Calcium  (STOOL SOFTENER PO) Take 1 capsule by mouth daily as needed (constipation).     Marland Kitchen KLOR-CON M20 20 MEQ tablet TAKE 2 TABLETS (40MEQ) BY MOUTH IN THE MORNING AND 2 TABLETS (40MEQ) IN THE EVENING. 360 tablet 1  . methocarbamol (ROBAXIN) 500 MG tablet Take 1 tablet (500 mg total) by mouth at bedtime as needed (pain). 30 tablet 3  . metoprolol succinate (TOPROL-XL) 50 MG 24 hr tablet Take 50 mg by mouth in the morning and take 25 mg by mouth in the evening. Take with or immediately following a meal.    . metoprolol tartrate (LOPRESSOR) 25 MG tablet TAKE 1 TABLET EVERY 6 HOURS AS NEEDED FOR FAST HEART RATES 30 tablet 3  . Multiple Vitamin (MULITIVITAMIN) LIQD Take 5 mLs by mouth daily.    . Multiple Vitamins-Minerals (ICAPS PO) Take 1 tablet by mouth daily.     . Omega-3 Fatty Acids (OMEGA 3 PO) Take 1 capsule by mouth daily.     Marland Kitchen omeprazole (PRILOSEC) 20 MG capsule Take 20 mg by mouth as directed. Pt takes 1 tablet by mouth every 4 days    . OVER THE COUNTER MEDICATION Place 3 drops into both eyes daily as needed (dry eyes). "thera tears"    . potassium chloride SA (K-DUR,KLOR-CON) 20 MEQ tablet Take 2 tablets (66meq) by mouth in the morning and in the evening. 360 tablet 3  . rivaroxaban (XARELTO) 20 MG TABS tablet Take 1 tablet (20 mg total) by mouth daily. 90 tablet 3  . tamsulosin (FLOMAX) 0.4 MG CAPS capsule TAKE ONE CAPSULE BY MOUTH ONCE DAILY 30 MINUTES AFTER THE SAME MEAL 90 capsule 1  . TIKOSYN 500 MCG capsule TAKE 1 CAPSULE BY MOUTH EVERY 12 HOURS 180 capsule 2  . traMADol (ULTRAM) 50 MG tablet Take 1 tablet (50 mg total) by mouth every 6 (six) hours as needed (pain). 60 tablet 5   Current Facility-Administered Medications  Medication Dose Route Frequency Provider Last Rate Last Dose  . pneumococcal 13-valent conjugate vaccine (PREVNAR 13) injection 0.5 mL  0.5 mL Intramuscular Tomorrow-1000 Elayne Snare, MD        Allergies: Allergies  Allergen Reactions  . Penicillins Itching and  Swelling    Social History: The patient  reports that he quit smoking about 37 years ago. His smoking use included Cigarettes. He has a 30 pack-year smoking history. He has never used smokeless tobacco. He reports that he drinks about 3.0 oz of alcohol per week. He reports that he does not use illicit drugs.   Family History: The patient's family history includes Arrhythmia in his sister; Atrial fibrillation in his brother; Heart attack in his father; Lung disease in his mother. There is no history of Diabetes.  Review of Systems: Please see the history of present illness.   Otherwise, the review of systems is positive for none.   All other systems are reviewed and negative.   Physical Exam: VS:  BP 130/74 mmHg  Pulse 68  Ht 6' (1.829 m)  Wt 227 lb 1.9 oz (103.021 kg)  BMI 30.80 kg/m2 .  BMI Body mass index is 30.8 kg/(m^2).  Wt Readings from Last 3 Encounters:  06/06/15 227 lb 1.9 oz (103.021 kg)  05/25/15 227 lb 3.2 oz (103.057 kg)  03/21/15 228 lb (103.42 kg)    General: Pleasant. Well developed, well nourished and in no acute distress.  HEENT: Normal. Neck: Supple, no JVD, carotid bruits, or masses noted.  Cardiac: Regular rate and rhythm. No murmurs, rubs, or gallops. No edema.  Respiratory:  Lungs are clear to auscultation bilaterally with normal work of breathing.  GI: Soft and nontender.  MS: No deformity or atrophy. Gait and ROM intact. Skin: Warm and dry. Color is normal.  Neuro:  Strength and sensation are intact and no gross focal deficits noted.  Psych: Alert, appropriate and with normal affect.   LABORATORY DATA:  EKG:  EKG is not ordered today.  Lab Results  Component Value Date   WBC 4.0 05/25/2015   HGB 14.8 05/25/2015   HCT 43.9 05/25/2015   PLT 100* 05/25/2015   GLUCOSE 111* 05/25/2015   CHOL 158 09/20/2014   TRIG 90.0 09/20/2014   HDL 50.80 09/20/2014   LDLCALC 89 09/20/2014   ALT 27 09/20/2014   AST 29 09/20/2014   NA 142 05/25/2015   K 4.5  05/25/2015   CL 108 05/25/2015   CREATININE 0.72 05/25/2015   BUN 14 05/25/2015   CO2 28 05/25/2015   TSH 1.20 09/20/2014   INR 1.43 10/28/2012   HGBA1C 5.8 09/20/2014    BNP (last 3 results) No results for input(s): BNP in the last 8760 hours.  ProBNP (last 3 results) No results for input(s): PROBNP in the last 8760 hours.   Other Studies Reviewed Today: Echo Study Conclusions from 05/2014  - Left ventricle: The cavity size was normal. Systolic function was moderately reduced. The estimated ejection fraction was in the range of 35% to 40%. Diffuse hypokinesis. Doppler parameters are consistent with abnormal left ventricular relaxation (grade 1 diastolic dysfunction). - Aortic valve: There was mild regurgitation.  Assessment/Plan: 1. PAF - on AAD therapy and has had past ablation - he remains on anticoagulation. He is happy with how he is doing at this time. No changes made.  2. HTN - BP ok  3. HLD - on statin therapy  4. Chronic anticoagulation - no problems noted. I have told him that if he were to have recurrent AF while off Xarelto for his procedure - to restart. I think we could consider virtual colonoscopy or just not proceed since this was totally elective and he has almost "aged out" of testing. He has no current GI issues.   5. Mild LV dysfunction - he is compensated at this time. Consider recheck later this year.   6. OSA - on CPAP  Current medicines are reviewed with the patient today.  The patient does not have concerns regarding medicines other than what has been noted above.  The following changes have been made:  See above.  Labs/ tests ordered today include:   No orders of the defined types were placed in this encounter.     Disposition:   FU with Dr. Acie Fredrickson in  June as new patient. Continue with AF clinic follow up.    Patient is agreeable to this plan and will call if any problems develop in the interim.   Signed: Burtis Junes, RN,  ANP-C 06/06/2015 12:01 PM  [facility] [address] [place], [place]  [address] Phone: [phone] Fax: [phone]

## 2015-06-06 NOTE — Telephone Encounter (Signed)
S/w pt is aware of new Dr. Acie Fredrickson appointment and agreeable.

## 2015-06-06 NOTE — Patient Instructions (Signed)
We will be checking the following labs today - NONE   Medication Instructions:    Continue with your current medicines.     Testing/Procedures To Be Arranged:  N/A  Follow-Up:   See Dr. Acie Fredrickson in June    Other Special Instructions:   If you have recurrent AF when off your Xarelto for your colonoscopy - restart Xarelto and reschedule the colonoscopy.     If you need a refill on your cardiac medications before your next appointment, please call your pharmacy.   Call the [place] office at [phone] if you have any questions, problems or concerns.

## 2015-06-22 ENCOUNTER — Other Ambulatory Visit: Payer: Self-pay | Admitting: Cardiology

## 2015-07-14 ENCOUNTER — Ambulatory Visit (INDEPENDENT_AMBULATORY_CARE_PROVIDER_SITE_OTHER): Payer: Medicare Other | Admitting: Cardiovascular Disease

## 2015-07-14 ENCOUNTER — Encounter: Payer: Self-pay | Admitting: Cardiovascular Disease

## 2015-07-14 VITALS — BP 94/68 | HR 60 | Ht 72.0 in | Wt 226.0 lb

## 2015-07-14 DIAGNOSIS — I4891 Unspecified atrial fibrillation: Secondary | ICD-10-CM

## 2015-07-14 DIAGNOSIS — I519 Heart disease, unspecified: Secondary | ICD-10-CM

## 2015-07-14 DIAGNOSIS — E785 Hyperlipidemia, unspecified: Secondary | ICD-10-CM

## 2015-07-14 DIAGNOSIS — I5022 Chronic systolic (congestive) heart failure: Secondary | ICD-10-CM

## 2015-07-14 DIAGNOSIS — I480 Paroxysmal atrial fibrillation: Secondary | ICD-10-CM

## 2015-07-14 LAB — COMPREHENSIVE METABOLIC PANEL
ALBUMIN: 3.9 g/dL (ref 3.6–5.1)
ALK PHOS: 55 U/L (ref 40–115)
ALT: 20 U/L (ref 9–46)
AST: 19 U/L (ref 10–35)
BILIRUBIN TOTAL: 1.1 mg/dL (ref 0.2–1.2)
BUN: 16 mg/dL (ref 7–25)
CALCIUM: 8.7 mg/dL (ref 8.6–10.3)
CO2: 26 mmol/L (ref 20–31)
Chloride: 106 mmol/L (ref 98–110)
Creat: 0.83 mg/dL (ref 0.70–1.18)
Glucose, Bld: 96 mg/dL (ref 65–99)
Potassium: 4.7 mmol/L (ref 3.5–5.3)
Sodium: 143 mmol/L (ref 135–146)
Total Protein: 6.5 g/dL (ref 6.1–8.1)

## 2015-07-14 LAB — LIPID PANEL
CHOLESTEROL: 150 mg/dL (ref 125–200)
HDL: 53 mg/dL (ref >=40)
LDL CALC: 82 mg/dL (ref <130)
TRIGLYCERIDES: 77 mg/dL (ref <150)
Total CHOL/HDL Ratio: 2.8 Ratio (ref <=5.0)
VLDL: 15 mg/dL (ref <30)

## 2015-07-14 LAB — MAGNESIUM: Magnesium: 2.2 mg/dL (ref 1.5–2.5)

## 2015-07-14 NOTE — Patient Instructions (Addendum)
Medication Instructions:  Your physician recommends that you continue on your current medications as directed. Please refer to the Current Medication list given to you today.   Labwork: TODAY - cholesterol, magnesium level, complete metabolic panel   Testing/Procedures: None Ordered   Follow-Up: Your physician wants you to follow-up in: 4 months with Dr. Acie Fredrickson.  You will receive a reminder letter in the mail two months in advance. If you don't receive a letter, please call our office to schedule the follow-up appointment.    If you need a refill on your cardiac medications before your next appointment, please call your pharmacy.   Thank you for choosing [facility]! Christen Bame, RN [phone]

## 2015-07-14 NOTE — Progress Notes (Signed)
Cardiology Office Note   Date:  07/14/2015   ID:  Connor [name], DOB 16-May-1933, MRN [id number]  PCP:  Elayne Snare, MD  Cardiologist:   Thayer Headings, MD   - previous patient of [facility]   Chief Complaint  Patient presents with  . Follow-up    paroxysmal atrial fib     Problem list 1. PAF - on AAD therapy and has had past ablation - he remains on anticoagulation. He is happy with how he is doing at this time. No changes made.  2. HTN - BP ok  3. HLD - on statin therapy  4. Chronic anticoagulation - no problems noted. I have told him that if he were to have recurrent AF while off Xarelto for his procedure - to restart. I think we could consider virtual colonoscopy or just not proceed since this was totally elective and he has almost "aged out" of testing. He has no current GI issues.   5. Mild LV dysfunction - he is compensated at this time. Consider recheck later this year.   6. OSA - on CPAP   History of Present Illness: Connor [name] is a 82 y.o. male who presents for paroxysmal atrial fib  Was seen with wife, Connor [name] today  Was last seen by Roderic Palau, NP   He is on tikosyn  Still has PAF - is fairly symptomatic.   Usually has to cancel his schedule  Retired form the [employer] some ,     Past Medical History  Diagnosis Date  . Hypertension   . Atrial flutter [facility])     s/p ablation in March 2012  . Hyperlipidemia   . Vitamin D deficiency   . RBBB (right bundle branch block)   . BPH (benign prostatic hyperplasia)   . Osteoarthritis   . Anemia   . Peptic ulcer disease   . DVT (deep venous thrombosis) ([place]) 2/12    in setting of R hip surgery, he developed R femoral DVT  . Chronic anticoagulation     on Xarelto for afib  . Abnormal nuclear cardiac imaging test 1/13    Abnormal stress test 05/2011, but cardiac cath 06/2011 showing normal coronary anatomy.  . Paroxysmal atrial fibrillation ([place]) 9/12    a.  documented by Dr Barrett Shell on office EKG 9/12. b. maintained on Tikosyn and Xarelto. c. s/p DCCV 10/2012.  Marland Kitchen Premature ventricular contraction   . Obstructive sleep apnea     compliant with CPAP.  managed by Dr Elsworth Soho.  . Prediabetes     Past Surgical History  Procedure Laterality Date  . Total hip arthroplasty  06/04/10    RIGHT HIP  . Total knee arthroplasty    . Transurethral resection of prostate    . Atrial flutter ablation  5/12    by JA  . Cardiac catheterization    . Cardioversion N/A 10/28/2012    Procedure: CARDIOVERSION;  Surgeon: Burnell Blanks, MD;  Location: [facility];  Service: Cardiovascular;  Laterality: N/A;  . Left heart catheterization with coronary angiogram N/A 06/07/2011    Procedure: LEFT HEART CATHETERIZATION WITH CORONARY ANGIOGRAM;  Surgeon: Peter M Martinique, MD;  Location[facility] C[facility] LAB;  Service: Cardiovascular;  Laterality: N/A;     Current Outpatient Prescriptions  Medication Sig Dispense Refill  . atorvastatin (LIPITOR) 10 MG tablet TAKE 1 TABLET (10 MG TOTAL) BY MOUTH DAILY. 90 tablet 1  . Cholecalciferol (VITAMIN D3) 5000 UNITS TABS Take  5,000 Units by mouth as directed. Pt takes 1 tablet by mouth every 4 days    . clindamycin (CLEOCIN T) 1 % external solution Apply 1 application topically daily as needed (for bumps in scalp). Apply to scalp daily as needed  2  . Docusate Calcium (STOOL SOFTENER PO) Take 1 capsule by mouth daily as needed (constipation).     Marland Kitchen KLOR-CON M20 20 MEQ tablet TAKE 2 TABLETS (40MEQ) BY MOUTH IN THE MORNING AND 2 TABLETS (40MEQ) IN THE EVENING. 360 tablet 1  . methocarbamol (ROBAXIN) 500 MG tablet Take 1 tablet (500 mg total) by mouth at bedtime as needed (pain). 30 tablet 3  . metoprolol succinate (TOPROL-XL) 50 MG 24 hr tablet Take 50 mg by mouth in the morning and take 25 mg by mouth in the evening. Take with or immediately following a meal.    . metoprolol tartrate (LOPRESSOR) 25 MG tablet TAKE 1 TABLET EVERY 6 HOURS AS NEEDED FOR  FAST HEART RATES 30 tablet 3  . Multiple Vitamin (MULITIVITAMIN) LIQD Take 5 mLs by mouth daily.    . Multiple Vitamins-Minerals (ICAPS PO) Take 1 tablet by mouth daily.     . Omega-3 Fatty Acids (OMEGA 3 PO) Take 1 capsule by mouth daily.     Marland Kitchen omeprazole (PRILOSEC) 20 MG capsule Take 20 mg by mouth as directed. Pt takes 1 tablet by mouth every 4 days    . OVER THE COUNTER MEDICATION Place 3 drops into both eyes daily as needed (dry eyes). "thera tears"    . tamsulosin (FLOMAX) 0.4 MG CAPS capsule TAKE ONE CAPSULE BY MOUTH ONCE DAILY 30 MINUTES AFTER THE SAME MEAL 90 capsule 1  . TIKOSYN 500 MCG capsule TAKE 1 CAPSULE BY MOUTH EVERY 12 HOURS 180 capsule 2  . traMADol (ULTRAM) 50 MG tablet Take 1 tablet (50 mg total) by mouth every 6 (six) hours as needed (pain). 60 tablet 5  . XARELTO 20 MG TABS tablet TAKE 1 TABLET BY MOUTH EVERY DAY 90 tablet 3   Current Facility-Administered Medications  Medication Dose Route Frequency Provider Last Rate Last Dose  . pneumococcal 13-valent conjugate vaccine (PREVNAR 13) injection 0.5 mL  0.5 mL Intramuscular Tomorrow-1000 Elayne Snare, MD        Allergies:   Penicillins    Social History:  The patient  reports that he quit smoking about 37 years ago. His smoking use included Cigarettes. He has a 30 pack-year smoking history. He has never used smokeless tobacco. He reports that he drinks about 3.0 oz of alcohol per week. He reports that he does not use illicit drugs.   Family History:  The patient's family history includes Arrhythmia in his sister; Atrial fibrillation in his brother; Heart attack in his father; Lung disease in his mother. There is no history of Diabetes.    ROS:  Please see the history of present illness.    Review of Systems: Constitutional:  denies fever, chills, diaphoresis, appetite change and fatigue.  HEENT: denies photophobia, eye pain, redness, hearing loss, ear pain, congestion, sore throat, rhinorrhea, sneezing, neck pain,  neck stiffness and tinnitus.  Respiratory: denies SOB, DOE, cough, chest tightness, and wheezing.  Cardiovascular: denies chest pain, palpitations and leg swelling.  Gastrointestinal: denies nausea, vomiting, abdominal pain, diarrhea, constipation, blood in stool.  Genitourinary: denies dysuria, urgency, frequency, hematuria, flank pain and difficulty urinating.  Musculoskeletal: denies  myalgias, back pain, joint swelling, arthralgias and gait problem.   Skin: denies pallor, rash and wound.  Neurological: denies dizziness, seizures, syncope, weakness, light-headedness, numbness and headaches.   Hematological: denies adenopathy, easy bruising, personal or family bleeding history.  Psychiatric/ Behavioral: denies suicidal ideation, mood changes, confusion, nervousness, sleep disturbance and agitation.       All other systems are reviewed and negative.    PHYSICAL EXAM: VS:  BP 94/68 mmHg  Pulse 60  Ht 6' (1.829 m)  Wt 226 lb (102.513 kg)  BMI 30.64 kg/m2 , BMI Body mass index is 30.64 kg/(m^2). GEN: Well nourished, well developed, in no acute distress HEENT: normal Neck: no JVD, carotid bruits, or masses Cardiac: RRR; no murmurs, rubs, or gallops,no edema  Respiratory:  clear to auscultation bilaterally, normal work of breathing GI: soft, nontender, nondistended, + BS MS: no deformity or atrophy Skin: warm and dry, no rash Neuro:  Strength and sensation are intact Psych: normal   EKG:  EKG is ordered today. The ekg ordered today demonstrates  NSR at 60.   RBBB    Recent Labs: 09/20/2014: ALT 27; TSH 1.20 05/25/2015: BUN 14; Creatinine, Ser 0.72; Hemoglobin 14.8; Magnesium 2.0; Platelets 100*; Potassium 4.5; Sodium 142    Lipid Panel    Component Value Date/Time   CHOL 158 09/20/2014 1005   TRIG 90.0 09/20/2014 1005   HDL 50.80 09/20/2014 1005   CHOLHDL 3 09/20/2014 1005   VLDL 18.0 09/20/2014 1005   LDLCALC 89 09/20/2014 1005      Wt Readings from Last 3  Encounters:  07/14/15 226 lb (102.513 kg)  06/06/15 227 lb 1.9 oz (103.021 kg)  05/25/15 227 lb 3.2 oz (103.057 kg)      Other studies Reviewed: Additional studies/ records that were reviewed today include: . Review of the above records demonstrates:    ASSESSMENT AND PLAN:  1. PAF - on Tikosyn  therapy and has had past  Flutter ablation - he remains on anticoagulation.   He would like to consider A-Fib . His LA size is normal by echo in jan. 2016. Will have hims see Dr. Rayann Heman for follow up   2. HTN - BP ok  3. HLD - on statin therapy  4. Chronic anticoagulation - no problems noted. I have told him that if he were to have recurrent AF while off Xarelto for his procedure - to restart. I think we could consider virtual colonoscopy or just not proceed since this was totally elective and he has almost "aged out" of testing. He has no current GI issues.   5. Mild LV dysfunction / Chronic systolic CHF  - he is compensated at this time.  Not on an ARB due to his hypotension  6. OSA - on CPAP   Current medicines are reviewed at length with the patient today.  The patient does not have concerns regarding medicines.  The following changes have been made:  no change  Labs/ tests ordered today include:  No orders of the defined types were placed in this encounter.     Disposition:   FU with me in 6 months      Margery Szostak, Wonda Cheng, MD  07/14/2015 10:11 AM    [facility] [address], [place], [place]  [address] Phone: [phone]; Fax: [phone]   [facility]  [address] [place], [place]  [address] [phone]   Fax [phone]

## 2015-07-20 ENCOUNTER — Encounter: Payer: Self-pay | Admitting: Internal Medicine

## 2015-07-20 ENCOUNTER — Ambulatory Visit (INDEPENDENT_AMBULATORY_CARE_PROVIDER_SITE_OTHER): Payer: Medicare Other | Admitting: Internal Medicine

## 2015-07-20 VITALS — BP 124/76 | HR 78 | Ht 72.0 in | Wt 228.2 lb

## 2015-07-20 DIAGNOSIS — I10 Essential (primary) hypertension: Secondary | ICD-10-CM

## 2015-07-20 DIAGNOSIS — I480 Paroxysmal atrial fibrillation: Secondary | ICD-10-CM

## 2015-07-20 DIAGNOSIS — I519 Heart disease, unspecified: Secondary | ICD-10-CM | POA: Diagnosis not present

## 2015-07-20 DIAGNOSIS — G4733 Obstructive sleep apnea (adult) (pediatric): Secondary | ICD-10-CM

## 2015-07-20 NOTE — Patient Instructions (Addendum)
Medication Instructions:  Your physician recommends that you continue on your current medications as directed. Please refer to the Current Medication list given to you today.   Labwork: Your physician recommends that you return for lab work on: 08/15/15 anytime.  You do not have to fast.  BMP/CBC   Testing/Procedures: Your physician has requested that you have an echocardiogram. Echocardiography is a painless test that uses sound waves to create images of your heart. It provides your doctor with information about the size and shape of your heart and how well your heart's chambers and valves are working. This procedure takes approximately one hour. There are no restrictions for this procedure.   Your physician has requested that you have cardiac CT. Cardiac computed tomography (CT) is a painless test that uses an x-ray machine to take clear, detailed pictures of your heart. For further information please visit [employer]. Please follow instruction sheet as given.---week prior to ablation   Your physician has recommended that you have an ablation. Catheter ablation is a medical procedure used to treat some cardiac arrhythmias (irregular heartbeats). During catheter ablation, a long, thin, flexible tube is put into a blood vessel in your groin (upper thigh), or neck. This tube is called an ablation catheter. It is then guided to your heart through the blood vessel. Radio frequency waves destroy small areas of heart tissue where abnormal heartbeats may cause an arrhythmia to start. Please see the instruction sheet given to you today.---08/29/15  Please arrive at the [facility] on 08/29/15 at 5:30am.  Do not eat or drink after midnight the night before your procedure and no medications the morning of the procedure  Follow-Up:  Your physician recommends that you schedule a follow-up appointment in: 4 weeks from 08/29/15 with Roderic Palau, NP and 3 months  from 08/29/15 with Dr Rayann Heman   Any Other Special Instructions Will Be Listed Below (If Applicable).  Cardiac Ablation Cardiac ablation is a procedure to disable a small amount of heart tissue in very specific places. The heart has many electrical connections. Sometimes these connections are abnormal and can cause the heart to beat very fast or irregularly. By disabling some of the problem areas, heart rhythm can be improved or made normal. Ablation is done for people who:   Have Wolff-Parkinson-White syndrome.   Have other fast heart rhythms (tachycardia).   Have taken medicines for an abnormal heart rhythm (arrhythmia) that resulted in:   No success.   Side effects.   May have a high-risk heartbeat that could result in death.  LET [facility] CARE PROVIDER KNOW ABOUT:   Any allergies you have or any previous reactions you have had to X-ray dye, food (such as seafood), medicine, or tape.   All medicines you are taking, including vitamins, herbs, eye drops, creams, and over-the-counter medicines.   Previous problems you or members of your family have had with the use of anesthetics.   Any blood disorders you have.   Previous surgeries or procedures (such as a kidney transplant) you have had.   Medical conditions you have (such as kidney failure).  RISKS AND COMPLICATIONS Generally, cardiac ablation is a safe procedure. However, problems can occur and include:   Increased risk of cancer. Depending on how long it takes to do the ablation, the dose of radiation can be high.  Bruising and bleeding where a thin, flexible tube (catheter) was inserted during the procedure.   Bleeding into the chest, especially into  the sac that surrounds the heart (serious).  Need for a permanent pacemaker if the normal electrical system is damaged.   The procedure may not be fully effective, and this may not be recognized for months. Repeat ablation procedures are sometimes  required. BEFORE THE PROCEDURE   Follow any instructions from your health care provider regarding eating and drinking before the procedure.   Take your medicines as directed at regular times with water, unless instructed otherwise by your health care provider. If you are taking diabetes medicine, including insulin, ask how you are to take it and if there are any special instructions you should follow. It is common to adjust insulin dosing the day of the ablation.  PROCEDURE  An ablation is usually performed in a catheterization laboratory with the guidance of fluoroscopy. Fluoroscopy is a type of X-ray that helps your health care provider see images of your heart during the procedure.   An ablation is a minimally invasive procedure. This means a small cut (incision) is made in either your neck or groin. Your health care provider will decide where to make the incision based on your medical history and physical exam.  An IV tube will be started before the procedure begins. You will be given an anesthetic or medicine to help you relax (sedative).  The skin on your neck or groin will be numbed. A needle will be inserted into a large vein in your neck or groin and catheters will be threaded to your heart.  A special dye that shows up on fluoroscopy pictures may be injected through the catheter. The dye helps your health care provider see the area of the heart that needs treatment.  The catheter has electrodes on the tip. When the area of heart tissue that is causing the arrhythmia is found, the catheter tip will send an electrical current to the area and "scar" the tissue. Three types of energy can be used to ablate the heart tissue:   Heat (radiofrequency energy).   Laser energy.   Extreme cold (cryoablation).   When the area of the heart has been ablated, the catheter will be taken out. Pressure will be held on the insertion site. This will help the insertion site clot and keep it from  bleeding. A bandage will be placed on the insertion site.  AFTER THE PROCEDURE   After the procedure, you will be taken to a recovery area where your vital signs (blood pressure, heart rate, and breathing) will be monitored. The insertion site will also be monitored for bleeding.   You will need to lie still for 4-6 hours. This is to ensure you do not bleed from the catheter insertion site.    This information is not intended to replace advice given to you by your health care provider. Make sure you discuss any questions you have with your health care provider.   Document Released: 09/08/2008 Document Revised: 05/13/2014 Document Reviewed: 09/14/2012 Elsevier Interactive Patient Education [employer].      If you need a refill on your cardiac medications before your next appointment, please call your pharmacy.

## 2015-07-20 NOTE — Progress Notes (Signed)
Electrophysiology Office Note   Date:  07/20/2015   ID:  Connor [name], DOB 12-22-1933, MRN [id number]  PCP:  Elayne Snare, MD  Cardiologist:  Dr Acie Fredrickson (previously Mare Ferrari) Primary Electrophysiologist:  [name], MD    Chief Complaint  Patient presents with  . Atrial Fibrillation     History of Present Illness: Connor [name] is a 82 y.o. male who presents today for electrophysiology follow-up.   He has paroxysmal atrial fibrillation.   He has taken [place] since 2013.  He underwent atrial flutter ablation by me in 2012.  He has tried amiodarone previously but doesn't recall problems with this.   He remains very active.   He is compliant with CPAP.  During AF he reports symptoms of increased urination, palpitations, and weakness.  His exercise tolerance is much reduced in AF.  His afib burden has increased since I saw him last. He now has afib every 7-10 days. Today, he denies symptoms of chest pain, shortness of breath, orthopnea, PND, lower extremity edema, claudication, dizziness, presyncope, syncope, bleeding, or neurologic sequela. The patient is tolerating medications without difficulties and is otherwise without complaint today.    Past Medical History  Diagnosis Date  . Hypertension   . Atrial flutter [facility])     s/p ablation in March 2012  . Hyperlipidemia   . Vitamin D deficiency   . RBBB (right bundle branch block)   . BPH (benign prostatic hyperplasia)   . Osteoarthritis   . Anemia   . Peptic ulcer disease   . DVT (deep venous thrombosis) ([place]) 2/12    in setting of R hip surgery, he developed R femoral DVT  . Chronic anticoagulation     on Xarelto for afib  . Abnormal nuclear cardiac imaging test 1/13    Abnormal stress test 05/2011, but cardiac cath 06/2011 showing normal coronary anatomy.  . Paroxysmal atrial fibrillation ([place]) 9/12    a. documented by Dr Barrett Shell on office EKG 9/12. b. maintained on Tikosyn and Xarelto. c. s/p DCCV 10/2012.  Marland Kitchen Premature  ventricular contraction   . Obstructive sleep apnea     compliant with CPAP.  managed by Dr Elsworth Soho.  . Prediabetes    Past Surgical History  Procedure Laterality Date  . Total hip arthroplasty  06/04/10    RIGHT HIP  . Total knee arthroplasty    . Transurethral resection of prostate    . Atrial flutter ablation  5/12    by JA  . Cardiac catheterization    . Cardioversion N/A 10/28/2012    Procedure: CARDIOVERSION;  Surgeon: Burnell Blanks, MD;  Location: [place];  Service: Cardiovascular;  Laterality: N/A;  . Left heart catheterization with coronary angiogram N/A 06/07/2011    Procedure: LEFT HEART CATHETERIZATION WITH CORONARY ANGIOGRAM;  Surgeon: Peter M Martinique, MD;  Location: [facility] CATH LAB;  Service: Cardiovascular;  Laterality: N/A;     Current Outpatient Prescriptions  Medication Sig Dispense Refill  . atorvastatin (LIPITOR) 10 MG tablet TAKE 1 TABLET (10 MG TOTAL) BY MOUTH DAILY. 90 tablet 1  . Cholecalciferol (VITAMIN D3) 5000 UNITS TABS Take 5,000 Units by mouth as directed. Pt takes 1 tablet by mouth every 4 days    . clindamycin (CLEOCIN T) 1 % external solution Apply 1 application topically daily as needed (for bumps in scalp). Apply to scalp daily as needed  2  . Docusate Calcium (STOOL SOFTENER PO) Take 1 capsule by mouth daily as needed (constipation).     Marland Kitchen  KLOR-CON M20 20 MEQ tablet TAKE 2 TABLETS (40MEQ) BY MOUTH IN THE MORNING AND 2 TABLETS (40MEQ) IN THE EVENING. 360 tablet 1  . methocarbamol (ROBAXIN) 500 MG tablet Take 1 tablet (500 mg total) by mouth at bedtime as needed (pain). 30 tablet 3  . metoprolol succinate (TOPROL-XL) 50 MG 24 hr tablet Take 50 mg by mouth in the morning and take 25 mg by mouth in the evening. Take with or immediately following a meal.    . metoprolol tartrate (LOPRESSOR) 25 MG tablet TAKE 1 TABLET EVERY 6 HOURS AS NEEDED FOR FAST HEART RATES 30 tablet 3  . Multiple Vitamin (MULITIVITAMIN) LIQD Take 5 mLs by mouth daily.    . Multiple  Vitamins-Minerals (ICAPS PO) Take 1 tablet by mouth daily.     . Omega-3 Fatty Acids (OMEGA 3 PO) Take 1 capsule by mouth daily.     Marland Kitchen omeprazole (PRILOSEC) 20 MG capsule Take 20 mg by mouth as directed. Pt takes 1 tablet by mouth every 4 days    . OVER THE COUNTER MEDICATION Place 3 drops into both eyes daily as needed (dry eyes). "thera tears"    . tamsulosin (FLOMAX) 0.4 MG CAPS capsule TAKE ONE CAPSULE BY MOUTH ONCE DAILY 30 MINUTES AFTER THE SAME MEAL 90 capsule 1  . TIKOSYN 500 MCG capsule TAKE 1 CAPSULE BY MOUTH EVERY 12 HOURS 180 capsule 2  . traMADol (ULTRAM) 50 MG tablet Take 1 tablet (50 mg total) by mouth every 6 (six) hours as needed (pain). 60 tablet 5  . XARELTO 20 MG TABS tablet TAKE 1 TABLET BY MOUTH EVERY DAY 90 tablet 3   Current Facility-Administered Medications  Medication Dose Route Frequency Provider Last Rate Last Dose  . pneumococcal 13-valent conjugate vaccine (PREVNAR 13) injection 0.5 mL  0.5 mL Intramuscular Tomorrow-1000 Elayne Snare, MD        Allergies:   Penicillins   Social History:  The patient  reports that he quit smoking about 37 years ago. His smoking use included Cigarettes. He has a 30 pack-year smoking history. He has never used smokeless tobacco. He reports that he drinks about 3.0 oz of alcohol per week. He reports that he does not use illicit drugs.   Family History:  The patient's family history includes Arrhythmia in his sister; Atrial fibrillation in his brother; Heart attack in his father; Lung disease in his mother. There is no history of Diabetes.    ROS:  Please see the history of present illness.   All other systems are reviewed and negative.    PHYSICAL EXAM: VS:  BP 124/76 mmHg  Pulse 78  Ht 6' (1.829 m)  Wt 228 lb 3.2 oz (103.511 kg)  BMI 30.94 kg/m2 , BMI Body mass index is 30.94 kg/(m^2). GEN: Well nourished, well developed, in no acute distress HEENT: normal Neck: no JVD, carotid bruits, or masses Cardiac: RRR; no murmurs,  rubs, or gallops,no edema  Respiratory:  clear to auscultation bilaterally, normal work of breathing GI: soft, nontender, nondistended, + BS MS: no deformity or atrophy Skin: warm and dry  Neuro:  Strength and sensation are intact Psych: euthymic mood, full affect  EKG:  EKG is ordered today. The ekg ordered today shows sinus rhythm 78 bpm, PR 160 msec, with RBBB, Qtc 515   Recent Labs: 09/20/2014: TSH 1.20 05/25/2015: Hemoglobin 14.8; Platelets 100* 07/14/2015: ALT 20; BUN 16; Creat 0.83; Magnesium 2.2; Potassium 4.7; Sodium 143    Lipid Panel  Component Value Date/Time   CHOL 150 07/14/2015 1038   TRIG 77 07/14/2015 1038   HDL 53 07/14/2015 1038   CHOLHDL 2.8 07/14/2015 1038   VLDL 15 07/14/2015 1038   LDLCALC 82 07/14/2015 1038     Wt Readings from Last 3 Encounters:  07/20/15 228 lb 3.2 oz (103.511 kg)  07/14/15 226 lb (102.513 kg)  06/06/15 227 lb 1.9 oz (103.021 kg)      Other studies Reviewed: Additional studies/ records that were reviewed today include: echo 2014  Review of the above records today demonstrates: LA 40 mm   ASSESSMENT AND PLAN:  1.  Paroxysmal atrial fibrillation The patient has symptomatic recurrent afib. He has failed medical therapy with Phyllis Ginger and has previously tried amiodarone.  He is s/p prior atrial flutter ablation.  He had positive gene marker for GENETIC AF but declined the study. Therapeutic strategies for afib including medicine and ablation were discussed in detail with the patient today. Risk, benefits, and alternatives to EP study and radiofrequency ablation for afib were also discussed in detail today. These risks include but are not limited to stroke, bleeding, vascular damage, tamponade, perforation, damage to the esophagus, lungs, and other structures, pulmonary vein stenosis, worsening renal function, and death. The patient understands these risk and wishes to proceed.  We will therefore proceed with catheter ablation at the  next available time.  Will obtain cardiac CT rather than TEE prior to ablation.  2. OSA Compliance with CPAP is advised  3. HTN Stable No change required today  4. Nonischemic CM Repeat echo at this time  Current medicines are reviewed at length with the patient today.   The patient does not have concerns regarding his medicines.  The following changes were made today:  none   Signed, [name], MD  07/20/2015 2:31 PM     [address] [place] [address] [phone] (office) [phone] (fax)

## 2015-07-27 ENCOUNTER — Encounter: Payer: Self-pay | Admitting: Internal Medicine

## 2015-08-08 ENCOUNTER — Other Ambulatory Visit (INDEPENDENT_AMBULATORY_CARE_PROVIDER_SITE_OTHER): Payer: Medicare Other | Admitting: *Deleted

## 2015-08-08 ENCOUNTER — Ambulatory Visit (HOSPITAL_COMMUNITY): Payer: Medicare Other | Attending: Cardiology

## 2015-08-08 ENCOUNTER — Other Ambulatory Visit: Payer: Self-pay

## 2015-08-08 DIAGNOSIS — I4891 Unspecified atrial fibrillation: Secondary | ICD-10-CM | POA: Diagnosis present

## 2015-08-08 DIAGNOSIS — I480 Paroxysmal atrial fibrillation: Secondary | ICD-10-CM | POA: Diagnosis not present

## 2015-08-08 DIAGNOSIS — I351 Nonrheumatic aortic (valve) insufficiency: Secondary | ICD-10-CM | POA: Diagnosis not present

## 2015-08-08 DIAGNOSIS — I119 Hypertensive heart disease without heart failure: Secondary | ICD-10-CM | POA: Diagnosis not present

## 2015-08-08 DIAGNOSIS — I493 Ventricular premature depolarization: Secondary | ICD-10-CM | POA: Insufficient documentation

## 2015-08-08 DIAGNOSIS — G4733 Obstructive sleep apnea (adult) (pediatric): Secondary | ICD-10-CM | POA: Insufficient documentation

## 2015-08-08 DIAGNOSIS — Z87891 Personal history of nicotine dependence: Secondary | ICD-10-CM | POA: Insufficient documentation

## 2015-08-08 DIAGNOSIS — Z8249 Family history of ischemic heart disease and other diseases of the circulatory system: Secondary | ICD-10-CM | POA: Diagnosis not present

## 2015-08-08 DIAGNOSIS — I4510 Unspecified right bundle-branch block: Secondary | ICD-10-CM | POA: Diagnosis not present

## 2015-08-08 DIAGNOSIS — E785 Hyperlipidemia, unspecified: Secondary | ICD-10-CM | POA: Insufficient documentation

## 2015-08-08 LAB — BASIC METABOLIC PANEL
BUN: 12 mg/dL (ref 7–25)
CALCIUM: 8.5 mg/dL — AB (ref 8.6–10.3)
CO2: 25 mmol/L (ref 20–31)
CREATININE: 0.69 mg/dL — AB (ref 0.70–1.18)
Chloride: 104 mmol/L (ref 98–110)
GLUCOSE: 120 mg/dL — AB (ref 65–99)
Potassium: 4.1 mmol/L (ref 3.5–5.3)
SODIUM: 141 mmol/L (ref 135–146)

## 2015-08-11 ENCOUNTER — Other Ambulatory Visit (INDEPENDENT_AMBULATORY_CARE_PROVIDER_SITE_OTHER): Payer: Medicare Other | Admitting: *Deleted

## 2015-08-11 DIAGNOSIS — I4891 Unspecified atrial fibrillation: Secondary | ICD-10-CM

## 2015-08-11 LAB — CBC WITH DIFFERENTIAL/PLATELET
BASOS ABS: 56 {cells}/uL (ref 0–200)
Basophils Relative: 1 %
EOS PCT: 6 %
Eosinophils Absolute: 336 cells/uL (ref 15–500)
HCT: 44.8 % (ref 38.5–50.0)
Hemoglobin: 15 g/dL (ref 13.2–17.1)
LYMPHS PCT: 23 %
Lymphs Abs: 1288 cells/uL (ref 850–3900)
MCH: 34 pg — AB (ref 27.0–33.0)
MCHC: 33.5 g/dL (ref 32.0–36.0)
MCV: 101.6 fL — ABNORMAL HIGH (ref 80.0–100.0)
MONOS PCT: 12 %
MPV: 11.9 fL (ref 7.5–12.5)
Monocytes Absolute: 672 cells/uL (ref 200–950)
NEUTROS PCT: 58 %
Neutro Abs: 3248 cells/uL (ref 1500–7800)
PLATELETS: 159 10*3/uL (ref 140–400)
RBC: 4.41 MIL/uL (ref 4.20–5.80)
RDW: 13.6 % (ref 11.0–15.0)
WBC: 5.6 10*3/uL (ref 3.8–10.8)

## 2015-08-11 NOTE — Addendum Note (Signed)
Addended by: Eulis Foster on: 08/11/2015 11:15 AM   Modules accepted: Orders

## 2015-08-15 ENCOUNTER — Other Ambulatory Visit: Payer: Medicare Other

## 2015-08-21 ENCOUNTER — Ambulatory Visit (HOSPITAL_COMMUNITY)
Admission: RE | Admit: 2015-08-21 | Discharge: 2015-08-21 | Disposition: A | Discharge status: Home / Self Care | Payer: Medicare Other | Source: Ambulatory Visit | Attending: Internal Medicine | Admitting: Internal Medicine

## 2015-08-21 ENCOUNTER — Encounter (HOSPITAL_COMMUNITY): Payer: Self-pay

## 2015-08-21 DIAGNOSIS — R918 Other nonspecific abnormal finding of lung field: Secondary | ICD-10-CM | POA: Insufficient documentation

## 2015-08-21 DIAGNOSIS — I480 Paroxysmal atrial fibrillation: Secondary | ICD-10-CM

## 2015-08-21 DIAGNOSIS — I517 Cardiomegaly: Secondary | ICD-10-CM | POA: Insufficient documentation

## 2015-08-21 IMAGING — CT CT HEART MORP W/ CTA COR W/ SCORE W/ CA W/CM &/OR W/O CM
1 of 10 series · 1 of 20 positions shown, 2 images · non-contrast
Comparison: Chest CT 06/12/2010.

CLINICAL DATA: Pre Afib Ablation

EXAM:
Cardiac CTA
MEDICATIONS:
None
TECHNIQUE: The patient was scanned on a [employer] [REDACTED]ice scanner. Gantry
rotation speed was 270 msecs. Collimation was .9mm. A 100 kV
prospective scan was triggered in the descending thoracic aorta at
111 HU's with 5% padding centered around 78% of the R-R interval.
Average HR during the scan was 68 bpm. The 3D data set was
interpreted on a dedicated work station using MPR, MIP and VRT
modes. A total of 80cc of contrast was used.

[Series 300: locator · axial · 0.35mm/px · z∈[-173,-173]mm · 1 of 1 slices shown, 2 images]
[im 1/1  vessel]
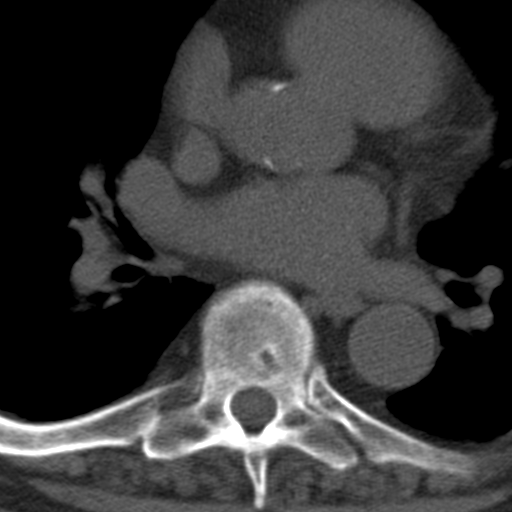
[im 1/1  lung]
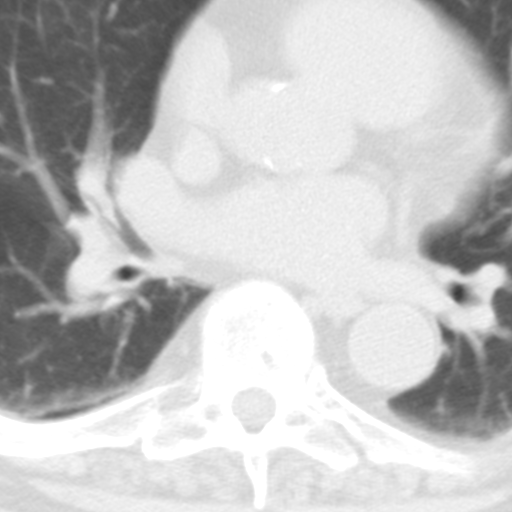

[1 of 20 positions shown; findings below may reference images not displayed]

FINDINGS: There was mild biatrial enlargement. The was a likely PFO. There was
no ASD. The RV appeared mildly dilated. The pulmonary veins drained
into the LA normally with no anomalies. The TRISH was

Small and without thrombus. The esophagus coursed closest to the
left inferior pulmonary vein. Measurements for the pulmonary veins
were as follows

LUPV:  Ostium 16.2 mm Area 2.34 cm2

LLPV:  Ostium 15.6 mm Area 2.2 cm2

RUPV:  22.4 mm Area 3.0 cm2

RLPV: 20.9 mm Area 3.0 cm2 there was a large superior branch to this
vessel mimicking a RMPV
IMPRESSION: 1) Normal pulmonary vein anatomy with no anomalies

2) Possible PFO

3) Kamhulu Imanu with no thrombus

4) Esophagus courses closest to LLPV

5) Mild biatrial enlargement

6) Mild RV enlargement

Surbhi Maltos

EXAM:
OVER-READ INTERPRETATION  CT CHEST

The following report is an over-read performed by radiologist Dr.
over-read does not include interpretation of cardiac or coronary
anatomy or pathology. The coronary calcium score/coronary CTA
interpretation by the cardiologist is attached.
FINDINGS: Small cluster of peribronchovascular micronodules are noted in the
anterior aspect of the right lower lobe, most likely to represent
areas of mucoid impaction. Within the visualized portions of the
thorax there are no other suspicious appearing pulmonary nodules or
masses, there is no acute consolidative airspace disease, no pleural
effusions, no pneumothorax and no lymphadenopathy. Visualized
portions of the upper abdomen are unremarkable. Schmorl's nodes in
several thoracic vertebral bodies incidentally noted. There are no
aggressive appearing lytic or blastic lesions noted in the
visualized portions of the skeleton.
IMPRESSION: 1. Small cluster of benign appearing peribronchovascular
micronodules in the right lower lobe are most compatible with areas
of mucoid impaction within terminal bronchioles no imaging follow-up
is recommended. This recommendation follows the consensus statement:
Guidelines for Management of Incidental Pulmonary Nodules Detected
on CT Images:From the [HOSPITAL] 8599; published online
before print (10.1148/radiol.9113161622).
2. No other significant incidental cardiac finding noted.

## 2015-08-21 MED ORDER — IOPAMIDOL (ISOVUE-370) INJECTION 76%
INTRAVENOUS | Status: AC
  Administered 2015-08-21: 80 mL
  Filled 2015-08-21: qty 100

## 2015-08-24 ENCOUNTER — Ambulatory Visit (HOSPITAL_COMMUNITY): Payer: Medicare Other | Admitting: Nurse Practitioner

## 2015-08-29 ENCOUNTER — Ambulatory Visit (HOSPITAL_COMMUNITY): Payer: Medicare Other | Admitting: Certified Registered Nurse Anesthetist

## 2015-08-29 ENCOUNTER — Encounter (HOSPITAL_COMMUNITY): Payer: Self-pay | Admitting: General Practice

## 2015-08-29 ENCOUNTER — Ambulatory Visit (HOSPITAL_COMMUNITY)
Admission: RE | Admit: 2015-08-29 | Discharge: 2015-08-30 | Disposition: A | Discharge status: Home / Self Care | Payer: Medicare Other | Source: Ambulatory Visit | Attending: Internal Medicine | Admitting: Internal Medicine

## 2015-08-29 ENCOUNTER — Encounter (HOSPITAL_COMMUNITY)
Admission: RE | Disposition: A | Discharge status: Home / Self Care | Payer: Self-pay | Source: Ambulatory Visit | Attending: Internal Medicine

## 2015-08-29 DIAGNOSIS — Z87891 Personal history of nicotine dependence: Secondary | ICD-10-CM | POA: Insufficient documentation

## 2015-08-29 DIAGNOSIS — N400 Enlarged prostate without lower urinary tract symptoms: Secondary | ICD-10-CM | POA: Diagnosis not present

## 2015-08-29 DIAGNOSIS — I4891 Unspecified atrial fibrillation: Secondary | ICD-10-CM | POA: Diagnosis present

## 2015-08-29 DIAGNOSIS — Z96659 Presence of unspecified artificial knee joint: Secondary | ICD-10-CM | POA: Insufficient documentation

## 2015-08-29 DIAGNOSIS — K219 Gastro-esophageal reflux disease without esophagitis: Secondary | ICD-10-CM | POA: Diagnosis not present

## 2015-08-29 DIAGNOSIS — I4510 Unspecified right bundle-branch block: Secondary | ICD-10-CM | POA: Diagnosis not present

## 2015-08-29 DIAGNOSIS — I10 Essential (primary) hypertension: Secondary | ICD-10-CM | POA: Diagnosis not present

## 2015-08-29 DIAGNOSIS — I4892 Unspecified atrial flutter: Secondary | ICD-10-CM | POA: Diagnosis present

## 2015-08-29 DIAGNOSIS — E785 Hyperlipidemia, unspecified: Secondary | ICD-10-CM | POA: Diagnosis not present

## 2015-08-29 DIAGNOSIS — R7303 Prediabetes: Secondary | ICD-10-CM | POA: Diagnosis not present

## 2015-08-29 DIAGNOSIS — M1990 Unspecified osteoarthritis, unspecified site: Secondary | ICD-10-CM | POA: Diagnosis not present

## 2015-08-29 DIAGNOSIS — Z7901 Long term (current) use of anticoagulants: Secondary | ICD-10-CM | POA: Insufficient documentation

## 2015-08-29 DIAGNOSIS — I480 Paroxysmal atrial fibrillation: Secondary | ICD-10-CM | POA: Insufficient documentation

## 2015-08-29 DIAGNOSIS — Z96641 Presence of right artificial hip joint: Secondary | ICD-10-CM | POA: Insufficient documentation

## 2015-08-29 DIAGNOSIS — G4733 Obstructive sleep apnea (adult) (pediatric): Secondary | ICD-10-CM | POA: Diagnosis present

## 2015-08-29 DIAGNOSIS — Z86718 Personal history of other venous thrombosis and embolism: Secondary | ICD-10-CM | POA: Diagnosis not present

## 2015-08-29 DIAGNOSIS — Z79899 Other long term (current) drug therapy: Secondary | ICD-10-CM | POA: Diagnosis not present

## 2015-08-29 HISTORY — PX: ABLATION OF DYSRHYTHMIC FOCUS: SHX254

## 2015-08-29 HISTORY — PX: ELECTROPHYSIOLOGIC STUDY: SHX172A

## 2015-08-29 LAB — CBC
HCT: 42.9 % (ref 39.0–52.0)
Hemoglobin: 14 g/dL (ref 13.0–17.0)
MCH: 32.7 pg (ref 26.0–34.0)
MCHC: 32.6 g/dL (ref 30.0–36.0)
MCV: 100.2 fL — ABNORMAL HIGH (ref 78.0–100.0)
PLATELETS: 126 10*3/uL — AB (ref 150–400)
RBC: 4.28 MIL/uL (ref 4.22–5.81)
RDW: 13.6 % (ref 11.5–15.5)
WBC: 5.6 10*3/uL (ref 4.0–10.5)

## 2015-08-29 LAB — BASIC METABOLIC PANEL
ANION GAP: 10 (ref 5–15)
BUN: 9 mg/dL (ref 6–20)
CALCIUM: 8.8 mg/dL — AB (ref 8.9–10.3)
CHLORIDE: 107 mmol/L (ref 101–111)
CO2: 24 mmol/L (ref 22–32)
CREATININE: 0.75 mg/dL (ref 0.61–1.24)
Glucose, Bld: 97 mg/dL (ref 65–99)
Potassium: 3.9 mmol/L (ref 3.5–5.1)
Sodium: 141 mmol/L (ref 135–145)

## 2015-08-29 LAB — POCT ACTIVATED CLOTTING TIME
ACTIVATED CLOTTING TIME: 322 s
ACTIVATED CLOTTING TIME: 332 s
ACTIVATED CLOTTING TIME: 209 s
Activated Clotting Time: 199 seconds

## 2015-08-29 SURGERY — ATRIAL FIBRILLATION ABLATION
Anesthesia: General

## 2015-08-29 MED ORDER — SODIUM CHLORIDE 0.9% FLUSH: 3.0000 mL | INTRAVENOUS | Status: DC | PRN

## 2015-08-29 MED ORDER — HYDROCODONE-ACETAMINOPHEN 5-325 MG PO TABS
1.0000 | ORAL_TABLET | ORAL | Status: DC | PRN
  Administered 2015-08-29: 2 via ORAL

## 2015-08-29 MED ORDER — RIVAROXABAN 20 MG PO TABS
20.0000 mg | ORAL_TABLET | Freq: Every day | ORAL | Status: DC
  Administered 2015-08-29: 20 mg via ORAL
  Filled 2015-08-29: qty 1

## 2015-08-29 MED ORDER — EPHEDRINE SULFATE 50 MG/ML IJ SOLN
INTRAMUSCULAR | Status: DC | PRN
  Administered 2015-08-29 (×2): 10 mg via INTRAVENOUS
  Administered 2015-08-29: 5 mg via INTRAVENOUS
  Administered 2015-08-29: 10 mg via INTRAVENOUS
  Administered 2015-08-29: 5 mg via INTRAVENOUS
  Administered 2015-08-29: 10 mg via INTRAVENOUS

## 2015-08-29 MED ORDER — HYDROCODONE-ACETAMINOPHEN 5-325 MG PO TABS
ORAL_TABLET | ORAL | Status: AC
  Filled 2015-08-29: qty 2

## 2015-08-29 MED ORDER — IOPAMIDOL (ISOVUE-370) INJECTION 76%
INTRAVENOUS | Status: DC | PRN
  Administered 2015-08-29: 3 mL

## 2015-08-29 MED ORDER — ACETAMINOPHEN 325 MG PO TABS
650.0000 mg | ORAL_TABLET | ORAL | Status: DC | PRN
  Administered 2015-08-29 – 2015-08-30 (×2): 650 mg via ORAL
  Filled 2015-08-29 (×2): qty 2

## 2015-08-29 MED ORDER — SODIUM CHLORIDE 0.9 % IV SOLN
INTRAVENOUS | Status: DC
  Administered 2015-08-29 (×2): via INTRAVENOUS

## 2015-08-29 MED ORDER — LIDOCAINE HCL (CARDIAC) 20 MG/ML IV SOLN
INTRAVENOUS | Status: DC | PRN
  Administered 2015-08-29: 60 mg via INTRAVENOUS

## 2015-08-29 MED ORDER — ONDANSETRON HCL 4 MG/2ML IJ SOLN: 4.0000 mg | Freq: Four times a day (QID) | INTRAMUSCULAR | Status: DC | PRN

## 2015-08-29 MED ORDER — PHENYLEPHRINE HCL 10 MG/ML IJ SOLN
INTRAMUSCULAR | Status: DC | PRN
  Administered 2015-08-29: 80 ug via INTRAVENOUS
  Administered 2015-08-29: 120 ug via INTRAVENOUS
  Administered 2015-08-29: 80 ug via INTRAVENOUS

## 2015-08-29 MED ORDER — HEPARIN SODIUM (PORCINE) 1000 UNIT/ML IJ SOLN
INTRAMUSCULAR | Status: DC | PRN
  Administered 2015-08-29: 12000 [IU] via INTRAVENOUS
  Administered 2015-08-29: 1000 [IU] via INTRAVENOUS

## 2015-08-29 MED ORDER — HEPARIN SODIUM (PORCINE) 1000 UNIT/ML IJ SOLN
INTRAMUSCULAR | Status: DC | PRN
  Administered 2015-08-29 (×2): 1000 [IU] via INTRAVENOUS

## 2015-08-29 MED ORDER — SODIUM CHLORIDE 0.9 % IV SOLN: 250.0000 mL | INTRAVENOUS | Status: DC | PRN

## 2015-08-29 MED ORDER — METOPROLOL SUCCINATE ER 25 MG PO TB24
25.0000 mg | ORAL_TABLET | Freq: Every day | ORAL | Status: DC
  Administered 2015-08-29: 18:00:00 25 mg via ORAL
  Filled 2015-08-29 (×2): qty 1

## 2015-08-29 MED ORDER — TAMSULOSIN HCL 0.4 MG PO CAPS
0.4000 mg | ORAL_CAPSULE | Freq: Every day | ORAL | Status: DC
  Administered 2015-08-29: 18:00:00 0.4 mg via ORAL
  Filled 2015-08-29: qty 1

## 2015-08-29 MED ORDER — PROTAMINE SULFATE 10 MG/ML IV SOLN
INTRAVENOUS | Status: DC | PRN
  Administered 2015-08-29: 30 mg via INTRAVENOUS

## 2015-08-29 MED ORDER — HEPARIN SODIUM (PORCINE) 1000 UNIT/ML IJ SOLN
INTRAMUSCULAR | Status: AC
  Filled 2015-08-29: qty 1

## 2015-08-29 MED ORDER — DOBUTAMINE IN D5W 4-5 MG/ML-% IV SOLN
INTRAVENOUS | Status: DC | PRN
  Administered 2015-08-29: 20 ug/kg/min via INTRAVENOUS

## 2015-08-29 MED ORDER — OFF THE BEAT BOOK
Freq: Once | Status: AC
  Administered 2015-08-29: 21:00:00
  Filled 2015-08-29: qty 1

## 2015-08-29 MED ORDER — TRAMADOL HCL 50 MG PO TABS: 50.0000 mg | ORAL_TABLET | Freq: Four times a day (QID) | ORAL | Status: DC | PRN

## 2015-08-29 MED ORDER — MIDAZOLAM HCL 5 MG/5ML IJ SOLN
INTRAMUSCULAR | Status: DC | PRN
  Administered 2015-08-29: 1 mg via INTRAVENOUS

## 2015-08-29 MED ORDER — METHOCARBAMOL 500 MG PO TABS: 500.0000 mg | ORAL_TABLET | Freq: Every evening | ORAL | Status: DC | PRN

## 2015-08-29 MED ORDER — BUPIVACAINE HCL (PF) 0.25 % IJ SOLN
INTRAMUSCULAR | Status: DC | PRN
  Administered 2015-08-29: 15 mL

## 2015-08-29 MED ORDER — BUPIVACAINE HCL (PF) 0.25 % IJ SOLN
INTRAMUSCULAR | Status: AC
  Filled 2015-08-29: qty 30

## 2015-08-29 MED ORDER — FENTANYL CITRATE (PF) 100 MCG/2ML IJ SOLN
INTRAMUSCULAR | Status: DC | PRN
  Administered 2015-08-29 (×3): 50 ug via INTRAVENOUS
  Administered 2015-08-29 (×2): 25 ug via INTRAVENOUS
  Administered 2015-08-29: 50 ug via INTRAVENOUS

## 2015-08-29 MED ORDER — SODIUM CHLORIDE 0.9% FLUSH
3.0000 mL | Freq: Two times a day (BID) | INTRAVENOUS | Status: DC
  Administered 2015-08-29 (×2): 3 mL via INTRAVENOUS

## 2015-08-29 MED ORDER — POTASSIUM CHLORIDE CRYS ER 20 MEQ PO TBCR
40.0000 meq | EXTENDED_RELEASE_TABLET | Freq: Two times a day (BID) | ORAL | Status: DC
Start: 2015-08-29 — End: 2015-08-30
  Administered 2015-08-29 – 2015-08-30 (×2): 40 meq via ORAL
  Filled 2015-08-29 (×3): qty 2

## 2015-08-29 MED ORDER — IOPAMIDOL (ISOVUE-370) INJECTION 76%
INTRAVENOUS | Status: AC
  Filled 2015-08-29: qty 50

## 2015-08-29 MED ORDER — ONDANSETRON HCL 4 MG/2ML IJ SOLN
INTRAMUSCULAR | Status: DC | PRN
  Administered 2015-08-29: 4 mg via INTRAVENOUS

## 2015-08-29 MED ORDER — PROPOFOL 10 MG/ML IV BOLUS
INTRAVENOUS | Status: DC | PRN
  Administered 2015-08-29: 180 mg via INTRAVENOUS

## 2015-08-29 MED ORDER — DOFETILIDE 500 MCG PO CAPS
500.0000 ug | ORAL_CAPSULE | Freq: Two times a day (BID) | ORAL | Status: DC
  Administered 2015-08-29 – 2015-08-30 (×2): 500 ug via ORAL
  Filled 2015-08-29 (×2): qty 1

## 2015-08-29 SURGICAL SUPPLY — 18 items
BAG SNAP BAND KOVER 36X36 (MISCELLANEOUS) ×2 IMPLANT
BLANKET WARM UNDERBOD FULL ACC (MISCELLANEOUS) ×2 IMPLANT
CATH NAVISTAR SMARTTOUCH DF (ABLATOR) ×2 IMPLANT
CATH SOUNDSTAR 3D IMAGING (CATHETERS) ×2 IMPLANT
CATH VARIABLE LASSO NAV 2515 (CATHETERS) ×2 IMPLANT
CATH WEBSTER BI DIR CS D-F CRV (CATHETERS) ×4 IMPLANT
COVER SWIFTLINK CONNECTOR (BAG) ×2 IMPLANT
NEEDLE TRANSEP BRK 71CM 407200 (NEEDLE) ×2 IMPLANT
PACK EP LATEX FREE (CUSTOM PROCEDURE TRAY) ×1
PACK EP LF (CUSTOM PROCEDURE TRAY) ×1 IMPLANT
PAD DEFIB LIFELINK (PAD) ×2 IMPLANT
PATCH CARTO3 (PAD) ×4 IMPLANT
SHEATH AVANTI 11F 11CM (SHEATH) ×2 IMPLANT
SHEATH PINNACLE 7F 10CM (SHEATH) ×4 IMPLANT
SHEATH PINNACLE 9F 10CM (SHEATH) ×2 IMPLANT
SHEATH SWARTZ TS SL2 63CM 8.5F (SHEATH) ×2 IMPLANT
SHIELD RADPAD SCOOP 12X17 (MISCELLANEOUS) ×2 IMPLANT
TUBING SMART ABLATE COOLFLOW (TUBING) ×2 IMPLANT

## 2015-08-29 NOTE — Anesthesia Preprocedure Evaluation (Signed)
Anesthesia Evaluation  Patient identified by MRN, date of birth, ID band Patient awake    Reviewed: Allergy & Precautions, NPO status , Patient's Chart, lab work & pertinent test results  Airway Mallampati: II   Neck ROM: full    Dental   Pulmonary sleep apnea , former smoker,    breath sounds clear to auscultation       Cardiovascular hypertension, + dysrhythmias Atrial Fibrillation  Rhythm:irregular Rate:Normal     Neuro/Psych    GI/Hepatic PUD, GERD  ,  Endo/Other    Renal/GU      Musculoskeletal  (+) Arthritis ,   Abdominal   Peds  Hematology   Anesthesia Other Findings   Reproductive/Obstetrics                             Anesthesia Physical Anesthesia Plan  ASA: III  Anesthesia Plan: MAC   Post-op Pain Management:    Induction: Intravenous  Airway Management Planned: Simple Face Mask  Additional Equipment:   Intra-op Plan:   Post-operative Plan:   Informed Consent: I have reviewed the patients History and Physical, chart, labs and discussed the procedure including the risks, benefits and alternatives for the proposed anesthesia with the patient or authorized representative who has indicated his/her understanding and acceptance.     Plan Discussed with: CRNA, Anesthesiologist and Surgeon  Anesthesia Plan Comments:         Anesthesia Quick Evaluation

## 2015-08-29 NOTE — Progress Notes (Signed)
Dr. Rayann Heman notified of ACT. OK to pull

## 2015-08-29 NOTE — Progress Notes (Signed)
Site area: rt groin   Site Prior to Removal:  Level 0 Pressure Applied For:   15 minutes Manual:   yes Patient Status During Pull:  Stable   Post Pull Site:  Level   0 Post Pull Instructions Given:  yes Post Pull Pulses Present:  yes Dressing Applied:  Small tegaderm Bedrest begins @  [id number] Comments:  IV saline locked

## 2015-08-29 NOTE — Discharge Instructions (Signed)
No driving for 4 days. No lifting over 5 lbs for 1 week. No sexual activity for 1 week. You may return to work in 1 week. Keep procedure site clean & dry. If you notice increased pain, swelling, bleeding or pus, call/return!  You may shower, but no soaking baths/hot tubs/pools for 1 week.  ° ° °You have an appointment set up with the Atrial Fibrillation Clinic.  Multiple studies have shown that being followed by a dedicated atrial fibrillation clinic in addition to the standard care you receive from your other physicians improves health. We believe that enrollment in the atrial fibrillation clinic will allow us to better care for you.  ° °The phone number to the Atrial Fibrillation Clinic is [phone]. The clinic is staffed Monday through Friday from 8:30am to 5pm. ° °Parking Directions: The clinic is located in the Heart and Vascular Building connected to [facility]. °1)From [address] turn on to [address] and go to the 3rd entrance  (Heart and Vascular entrance) on the right. °2)Look to the right for Heart &Vascular Parking Garage. °3)A code for the entrance is required please call the clinic to receive this.   °4)Take the elevators to the 1st floor. Registration is in the room with the glass walls at the end of the hallway. ° °If you have any trouble parking or locating the clinic, please don’t hesitate to call [phone]. ° ° °

## 2015-08-29 NOTE — Progress Notes (Signed)
Ate [place] sandwich. States back pain is gone. Neck stiffness. Offered pillow roll.

## 2015-08-29 NOTE — Progress Notes (Signed)
Wife in to see 

## 2015-08-29 NOTE — Transfer of Care (Signed)
Immediate Anesthesia Transfer of Care Note  Patient: Connor [name]  Procedure(s) Performed: Procedure(s): Atrial Fibrillation Ablation (N/A)  Patient Location: PACU  Anesthesia Type:General  Level of Consciousness: awake, alert , oriented and patient cooperative  Airway & Oxygen Therapy: Patient Spontanous Breathing and Patient connected to face mask oxygen  Post-op Assessment: Report given to RN, Post -op Vital signs reviewed and stable, Patient moving all extremities and Patient moving all extremities X 4  Post vital signs: Reviewed and stable  Last Vitals:  Filed Vitals:   08/29/15 0616  BP: 124/85  Pulse: 76  Temp: 36.5 C  Resp: 18    Complications: No apparent anesthesia complications

## 2015-08-29 NOTE — Discharge Summary (Signed)
ELECTROPHYSIOLOGY PROCEDURE DISCHARGE SUMMARY    Patient ID: Connor [name],  MRN: [id number], DOB/AGE: [birth year] 82 y.o.  Admit date: 08/29/2015 Discharge date: 08/30/2015  Primary Care Physician: Elayne Snare, MD Primary Cardiologist: Connor [name] Electrophysiologist: Connor Grayer, MD  Primary Discharge Diagnosis:  Paroxysmal atrial fibrillation status post ablation this admission  Secondary Discharge Diagnosis:  1.  Hypertension 2.  OSA 3.  Hyperlipidemia 4.  Osteoarthritis 5.  Prior DVT 6.  Pre-diabetes  Procedures This Admission:  1.  Electrophysiology study and radiofrequency catheter ablation on 08/29/15 by Connor Connor [name].  This study demonstrated sinus rhythm upon presentation; complete bidirectional cavotricuspid isthmus block confirmed from a prior ablation procedure; successful electrical isolation and anatomical encircling of all four pulmonary veins with radiofrequency current; no inducible arrhythmias following ablation both on and off of dobutamine; no early apparent complications.    Brief HPI: Connor [name] is a 82 y.o. male with a history of paroxysmal atrial fibrillation.  They have failed medical therapy with Tikosyn and amiodarone. Risks, benefits, and alternatives to catheter ablation of atrial fibrillation were reviewed with the patient who wished to proceed.  The patient underwent cardiac CT prior to the procedure which demonstrated no LAA thrombus.    Hospital Course:  The patient was admitted and underwent EPS/RFCA of atrial fibrillation with details as outlined above.  They were monitored on telemetry overnight which demonstrated normal sinus rhythm with RBBB without any recurrent afib.  Groin was without complication on the day of discharge.  The patient was examined and considered to be stable for discharge.  Wound care and restrictions were reviewed with the patient.  The patient Connor [name] be seen back by Connor Palau, NP in 4 weeks and Connor [name] in 12 weeks for  post ablation follow up.   This patients CHA2DS2-VASc Score and unadjusted Ischemic Stroke Rate (% per year) is equal to 3.2 % stroke rate/year from a score of 3 Above score calculated as 1 point each if present [CHF, HTN, DM, Vascular=MI/PAD/Aortic Plaque, Age if 65-74, or Male] Above score calculated as 2 points each if present [Age > 75, or Stroke/TIA/TE]   Physical Exam: Filed Vitals:   08/29/15 1917 08/29/15 2000 08/30/15 0559 08/30/15 0729  BP: 94/51  111/66   Pulse: 89  82   Temp: 98.3 F (36.8 C)  98.9 F (37.2 C) 98.2 F (36.8 C)  TempSrc: Oral  Oral Oral  Resp: 21 20 18    Height:      Weight:   220 lb 10.9 oz (100.1 kg)   SpO2: 96%  94%     GEN- The patient is well appearing, alert and oriented x 3 today.   HEENT: normocephalic, atraumatic; sclera clear, conjunctiva pink; hearing intact; oropharynx clear; neck supple  Lungs- Clear to ausculation bilaterally, normal work of breathing.  No wheezes, rales, rhonchi Heart- Regular rate and rhythm, no murmurs, rubs or gallops  GI- soft, non-tender, non-distended, bowel sounds present  Extremities- no clubbing, cyanosis, or edema; DP/PT/radial pulses 2+ bilaterally, groin without hematoma/bruit MS- no significant deformity or atrophy Skin- warm and dry, no rash or lesion Psych- euthymic mood, full affect Neuro- strength and sensation are intact   Labs:   Lab Results  Component Value Date   WBC 5.6 08/29/2015   HGB 14.0 08/29/2015   HCT 42.9 08/29/2015   MCV 100.2* 08/29/2015   PLT 126* 08/29/2015     Recent Labs Lab 08/29/15 0624  NA 141  K 3.9  CL 107  CO2 24  BUN 9  CREATININE 0.75  CALCIUM 8.8*  GLUCOSE 97     Discharge Medications:    Medication List    TAKE these medications        acetaminophen 500 MG tablet  Commonly known as:  TYLENOL  Take 1,000 mg by mouth 2 (two) times daily as needed for moderate pain.     atorvastatin 10 MG tablet  Commonly known as:  LIPITOR  TAKE 1 TABLET (10  MG TOTAL) BY MOUTH DAILY.     clindamycin 1 % external solution  Commonly known as:  CLEOCIN T  Apply 1 application topically daily as needed (for bumps in scalp). Apply to scalp daily as needed     [employer] SLEEP AID NIGHTTIME PO  Take 1 tablet by mouth at bedtime.     ICAPS PO  Take 1 tablet by mouth daily.     KLOR-CON M20 20 MEQ tablet  Generic drug:  potassium chloride SA  TAKE 2 TABLETS (40MEQ) BY MOUTH IN THE MORNING AND 2 TABLETS (40MEQ) IN THE EVENING.     methocarbamol 500 MG tablet  Commonly known as:  ROBAXIN  Take 1 tablet (500 mg total) by mouth at bedtime as needed (pain).     metoprolol succinate 50 MG 24 hr tablet  Commonly known as:  TOPROL-XL  Take 1 tablet (50 mg total) by mouth daily.     metoprolol tartrate 25 MG tablet  Commonly known as:  LOPRESSOR  TAKE 1 TABLET EVERY 6 HOURS AS NEEDED FOR FAST HEART RATES     multivitamin Liqd  Take 5 mLs by mouth daily.     OMEGA 3 PO  Take 3 capsules by mouth daily.     omeprazole 20 MG capsule  Commonly known as:  PRILOSEC  Take 20 mg by mouth as directed. Pt takes 1 tablet by mouth every 4 days     OVER THE COUNTER MEDICATION  Place 3 drops into both eyes daily as needed (dry eyes). "thera tears"     STOOL SOFTENER PO  Take 1 capsule by mouth daily as needed (constipation).     tamsulosin 0.4 MG Caps capsule  Commonly known as:  FLOMAX  TAKE ONE CAPSULE BY MOUTH ONCE DAILY 30 MINUTES AFTER THE SAME MEAL     TIKOSYN 500 MCG capsule  Generic drug:  dofetilide  TAKE 1 CAPSULE BY MOUTH EVERY 12 HOURS     traMADol 50 MG tablet  Commonly known as:  ULTRAM  Take 1 tablet (50 mg total) by mouth every 6 (six) hours as needed (pain).     Vitamin D3 5000 units Tabs  Take 5,000 Units by mouth as directed. Pt takes 1 tablet by mouth every 4 days     XARELTO 20 MG Tabs tablet  Generic drug:  rivaroxaban  TAKE 1 TABLET BY MOUTH EVERY DAY        Disposition:  Discharge Instructions    Diet - low sodium  heart healthy    Complete by:  As directed      Increase activity slowly    Complete by:  As directed           Follow-up Information    Follow up with [facility] On 09/27/2015.   Specialty:  Cardiology   Why:  at [facility] information:   [id number] [place] [phone]      Follow up with Jeneen Rinks  Allred, MD On 11/29/2015.   Specialty:  Cardiology   Why:  at 9:30AM    Contact information:   [place] [address] [phone]       Duration of Discharge Encounter: Greater than 30 minutes including physician time.  Signed, Chanetta Marshall, NP 08/30/2015 8:08 AM    I have seen and examined this patient with Almyra Deforest.  Agree with above, note added to reflect my findings.  On exam, regular rhythm, no murmurs, lungs clear.  Had AF ablation without issue by Connor. Rayann [name].  Feeling well this AM, plan for discharge today with follow up in 1 month in AF clinic.    Jahni Nazar M. Lennart Gladish MD 08/30/2015 9:24 AM

## 2015-08-29 NOTE — H&P (Signed)
Chief Complaint  Patient presents with  . Atrial Fibrillation    History of Present Illness: Connor [name] is a 82 y.o. male who presents today for afib ablation. He has paroxysmal atrial fibrillation. He has taken [place] since 2013. He underwent atrial flutter ablation by me in 2012. He has tried amiodarone previously but doesn't recall problems with this. He remains very active. He is compliant with CPAP. During AF he reports symptoms of increased urination, palpitations, and weakness. His exercise tolerance is much reduced in AF. His afib burden has increased since I saw him last. He now has afib every 7-10 days. Today, he denies symptoms of chest pain, shortness of breath, orthopnea, PND, lower extremity edema, claudication, dizziness, presyncope, syncope, bleeding, or neurologic sequela. The patient is tolerating medications without difficulties and is otherwise without complaint today.    Past Medical History  Diagnosis Date  . Hypertension   . Atrial flutter [facility])     s/p ablation in March 2012  . Hyperlipidemia   . Vitamin D deficiency   . RBBB (right bundle branch block)   . BPH (benign prostatic hyperplasia)   . Osteoarthritis   . Anemia   . Peptic ulcer disease   . DVT (deep venous thrombosis) ([place]) 2/12    in setting of R hip surgery, he developed R femoral DVT  . Chronic anticoagulation     on Xarelto for afib  . Abnormal nuclear cardiac imaging test 1/13    Abnormal stress test 05/2011, but cardiac cath 06/2011 showing normal coronary anatomy.  . Paroxysmal atrial fibrillation ([place]) 9/12    a. documented by Dr Barrett Shell on office EKG 9/12. b. maintained on Tikosyn and Xarelto. c. s/p DCCV 10/2012.  Marland Kitchen Premature ventricular contraction   . Obstructive sleep apnea     compliant with CPAP. managed by Dr Elsworth Soho.  . Prediabetes    Past Surgical History  Procedure Laterality Date  .  Total hip arthroplasty  06/04/10    RIGHT HIP  . Total knee arthroplasty    . Transurethral resection of prostate    . Atrial flutter ablation  5/12    by JA  . Cardiac catheterization    . Cardioversion N/A 10/28/2012    Procedure: CARDIOVERSION; Surgeon: Burnell Blanks, MD; Location: [place]; Service: Cardiovascular; Laterality: N/A;  . Left heart catheterization with coronary angiogram N/A 06/07/2011    Procedure: LEFT HEART CATHETERIZATION WITH CORONARY ANGIOGRAM; Surgeon: Peter M Martinique, MD; Location: [facility] CATH LAB; Service: Cardiovascular; Laterality: N/A;     Current Outpatient Prescriptions  Medication Sig Dispense Refill  . atorvastatin (LIPITOR) 10 MG tablet TAKE 1 TABLET (10 MG TOTAL) BY MOUTH DAILY. 90 tablet 1  . Cholecalciferol (VITAMIN D3) 5000 UNITS TABS Take 5,000 Units by mouth as directed. Pt takes 1 tablet by mouth every 4 days    . clindamycin (CLEOCIN T) 1 % external solution Apply 1 application topically daily as needed (for bumps in scalp). Apply to scalp daily as needed  2  . Docusate Calcium (STOOL SOFTENER PO) Take 1 capsule by mouth daily as needed (constipation).     Marland Kitchen KLOR-CON M20 20 MEQ tablet TAKE 2 TABLETS (40MEQ) BY MOUTH IN THE MORNING AND 2 TABLETS (40MEQ) IN THE EVENING. 360 tablet 1  . methocarbamol (ROBAXIN) 500 MG tablet Take 1 tablet (500 mg total) by mouth at bedtime as needed (pain). 30 tablet 3  . metoprolol succinate (TOPROL-XL) 50 MG 24 hr tablet Take 50 mg by mouth in  the morning and take 25 mg by mouth in the evening. Take with or immediately following a meal.    . metoprolol tartrate (LOPRESSOR) 25 MG tablet TAKE 1 TABLET EVERY 6 HOURS AS NEEDED FOR FAST HEART RATES 30 tablet 3  . Multiple Vitamin (MULITIVITAMIN) LIQD Take 5 mLs by mouth daily.    . Multiple Vitamins-Minerals (ICAPS PO) Take 1 tablet by mouth daily.     . Omega-3 Fatty  Acids (OMEGA 3 PO) Take 1 capsule by mouth daily.     Marland Kitchen omeprazole (PRILOSEC) 20 MG capsule Take 20 mg by mouth as directed. Pt takes 1 tablet by mouth every 4 days    . OVER THE COUNTER MEDICATION Place 3 drops into both eyes daily as needed (dry eyes). "thera tears"    . tamsulosin (FLOMAX) 0.4 MG CAPS capsule TAKE ONE CAPSULE BY MOUTH ONCE DAILY 30 MINUTES AFTER THE SAME MEAL 90 capsule 1  . TIKOSYN 500 MCG capsule TAKE 1 CAPSULE BY MOUTH EVERY 12 HOURS 180 capsule 2  . traMADol (ULTRAM) 50 MG tablet Take 1 tablet (50 mg total) by mouth every 6 (six) hours as needed (pain). 60 tablet 5  . XARELTO 20 MG TABS tablet TAKE 1 TABLET BY MOUTH EVERY DAY 90 tablet 3   Current Facility-Administered Medications  Medication Dose Route Frequency Provider Last Rate Last Dose  . pneumococcal 13-valent conjugate vaccine (PREVNAR 13) injection 0.5 mL 0.5 mL Intramuscular Tomorrow-1000 Elayne Snare, MD      Allergies: Penicillins   Social History: The patient  reports that he quit smoking about 37 years ago. His smoking use included Cigarettes. He has a 30 pack-year smoking history. He has never used smokeless tobacco. He reports that he drinks about 3.0 oz of alcohol per week. He reports that he does not use illicit drugs.   Family History: The patient's family history includes Arrhythmia in his sister; Atrial fibrillation in his brother; Heart attack in his father; Lung disease in his mother. There is no history of Diabetes.    ROS: Please see the history of present illness. All other systems are reviewed and negative.    PHYSICAL EXAM: Filed Vitals:   08/29/15 0616  BP: 124/85  Pulse: 76  Temp: 97.7 F (36.5 C)  Resp: 18   GEN: Well nourished, well developed, in no acute distress  HEENT: normal  Neck: no JVD, carotid bruits, or masses Cardiac: RRR; no murmurs, rubs, or gallops,no edema  Respiratory: clear to auscultation  bilaterally, normal work of breathing GI: soft, nontender, nondistended, + BS MS: no deformity or atrophy  Skin: warm and dry  Neuro: Strength and sensation are intact Psych: euthymic mood, full affect    Wt Readings from Last 3 Encounters:  07/20/15 228 lb 3.2 oz (103.511 kg)  07/14/15 226 lb (102.513 kg)  06/06/15 227 lb 1.9 oz (103.021 kg)      Other studies Reviewed: Additional studies/ records that were reviewed today include: cardiac CT Review of the above records today demonstrates: LA 40 mm   ASSESSMENT AND PLAN:  1. Paroxysmal atrial fibrillation The patient has symptomatic recurrent afib. He has failed medical therapy with Phyllis Ginger and has previously tried amiodarone. He is s/p prior atrial flutter ablation. He had positive gene marker for GENETIC AF but declined the study. Therapeutic strategies for afib including medicine and ablation were discussed in detail with the patient today. Risk, benefits, and alternatives to EP study and radiofrequency ablation for afib were also discussed in detail  today. These risks include but are not limited to stroke, bleeding, vascular damage, tamponade, perforation, damage to the esophagus, lungs, and other structures, pulmonary vein stenosis, worsening renal function, and death. The patient understands these risk and wishes to proceed.   [name] MD, [facility] 08/29/2015 7:25 AM

## 2015-08-29 NOTE — Anesthesia Procedure Notes (Signed)
Procedure Name: LMA Insertion Date/Time: 08/29/2015 7:55 AM Performed by: Shirlyn Goltz Pre-anesthesia Checklist: Patient identified, Emergency Drugs available, Suction available and Patient being monitored Patient Re-evaluated:Patient Re-evaluated prior to inductionOxygen Delivery Method: Circle system utilized Preoxygenation: Pre-oxygenation with 100% oxygen Intubation Type: IV induction Ventilation: Mask ventilation without difficulty LMA: LMA inserted LMA Size: 5.0 Number of attempts: 1 Placement Confirmation: ETT inserted through vocal cords under direct vision,  positive ETCO2 and breath sounds checked- equal and bilateral Tube secured with: Tape Dental Injury: Teeth and Oropharynx as per pre-operative assessment

## 2015-08-29 NOTE — Anesthesia Postprocedure Evaluation (Signed)
Anesthesia Post Note  Patient: Connor [name]  Procedure(s) Performed: Procedure(s) (LRB): Atrial Fibrillation Ablation (N/A)  Patient location during evaluation: PACU Anesthesia Type: General Level of consciousness: awake and alert and patient cooperative Pain management: pain level controlled Vital Signs Assessment: post-procedure vital signs reviewed and stable Respiratory status: spontaneous breathing and respiratory function stable Cardiovascular status: stable Anesthetic complications: no    Last Vitals:  Filed Vitals:   08/29/15 0616 08/29/15 1030  BP: 124/85 122/73  Pulse: 76 85  Temp: 36.5 C   Resp: 18 11    Last Pain: There were no vitals filed for this visit.               [place]

## 2015-08-30 ENCOUNTER — Encounter (HOSPITAL_COMMUNITY): Payer: Self-pay | Admitting: Internal Medicine

## 2015-08-30 DIAGNOSIS — I10 Essential (primary) hypertension: Secondary | ICD-10-CM | POA: Diagnosis not present

## 2015-08-30 DIAGNOSIS — I483 Typical atrial flutter: Secondary | ICD-10-CM | POA: Diagnosis not present

## 2015-08-30 DIAGNOSIS — G4733 Obstructive sleep apnea (adult) (pediatric): Secondary | ICD-10-CM | POA: Diagnosis not present

## 2015-08-30 DIAGNOSIS — E785 Hyperlipidemia, unspecified: Secondary | ICD-10-CM | POA: Diagnosis not present

## 2015-08-30 DIAGNOSIS — I480 Paroxysmal atrial fibrillation: Secondary | ICD-10-CM | POA: Diagnosis not present

## 2015-08-30 DIAGNOSIS — I4510 Unspecified right bundle-branch block: Secondary | ICD-10-CM

## 2015-08-30 MED ORDER — METOPROLOL SUCCINATE ER 50 MG PO TB24: 50.0000 mg | ORAL_TABLET | Freq: Every day | ORAL | Status: DC

## 2015-08-30 MED ORDER — METOPROLOL SUCCINATE ER 50 MG PO TB24
50.0000 mg | ORAL_TABLET | Freq: Every day | ORAL | Status: DC
  Administered 2015-08-30: 50 mg via ORAL
  Filled 2015-08-30: qty 1

## 2015-08-30 NOTE — Progress Notes (Signed)
D/C instructions reviewed with patient.  He is HOH so he read the instructions and asked appropriate questions which were answered.  Pt demonstrated good knowledge of the material, knows when to call MD or 911.  IV/TELE out by NT Tai.  Waiting for wife.

## 2015-08-30 NOTE — Progress Notes (Signed)
Patient Name: Connor [name] Date of Encounter: 08/30/2015  Primary Cardiologist: Dr. Nahser/Dr. Allred  Patient Profile 82 yo male with h/o OSA on CPAP, HTN, HLD and h/o aflutter s/p ablation 2012 has recent increased afib burden with fatigue and palpitation. He presented on 4/25 for afib ablation.    Principal Problem:   A-fib [facility]) Active Problems:   h/o Atrial flutter ([place]) s/p ablation 2012   Hyperlipidemia   Hypertension   GERD (gastroesophageal reflux disease)   OSA (obstructive sleep apnea)   RBBB    SUBJECTIVE  He denies any SOB, no significant chest discomfort.   CURRENT MEDS . dofetilide  500 mcg Oral BID  . metoprolol succinate  25 mg Oral Daily  . potassium chloride SA  40 mEq Oral BID  . rivaroxaban  20 mg Oral Q supper  . sodium chloride flush  3 mL Intravenous Q12H  . tamsulosin  0.4 mg Oral QPC supper    OBJECTIVE  Filed Vitals:   08/29/15 1917 08/29/15 2000 08/30/15 0559 08/30/15 0729  BP: 94/51  111/66   Pulse: 89  82   Temp: 98.3 F (36.8 C)  98.9 F (37.2 C) 98.2 F (36.8 C)  TempSrc: Oral  Oral Oral  Resp: 21 20 18    Height:      Weight:   220 lb 10.9 oz (100.1 kg)   SpO2: 96%  94%     Intake/Output Summary (Last 24 hours) at 08/30/15 0808 Last data filed at 08/30/15 0730  Gross per 24 hour  Intake   1720 ml  Output     25 ml  Net   1695 ml   Filed Weights   08/29/15 0616 08/30/15 0559  Weight: 220 lb (99.791 kg) 220 lb 10.9 oz (100.1 kg)    PHYSICAL EXAM  General: Pleasant, NAD. Neuro: Alert and oriented X 3. Moves all extremities spontaneously. Psych: Normal affect. HEENT:  Normal  Neck: Supple without bruits or JVD. Lungs:  Resp regular and unlabored, CTA. Heart: RRR no s3, s4, or murmurs. Abdomen: Soft, non-tender, non-distended, BS + x 4. R femoral cath site stable, no bleeding or hematoma Extremities: No clubbing, cyanosis or edema. DP/PT/Radials 2+ and equal bilaterally.  Accessory Clinical  Findings  CBC  Recent Labs  08/29/15 0624  WBC 5.6  HGB 14.0  HCT 42.9  MCV 100.2*  PLT 123XX123*   Basic Metabolic Panel  Recent Labs  08/29/15 0624  NA 141  K 3.9  CL 107  CO2 24  GLUCOSE 97  BUN 9  CREATININE 0.75  CALCIUM 8.8*    TELE NSR with RBBB throughout the entire night, no obvious recurrent afib    ECG  NSR with RBBB  Echocardiogram 08/08/2015  LV EF: 45% - 50%  ------------------------------------------------------------------- Indications: Atrial Fibrillation (I48.91).  ------------------------------------------------------------------- History: PMH: Atrial flutter. Atrial fibrillation. Risk factors: Right Bundle Branch Block, Deep Vein Thrombosis, PVC&'s, Obstructive Sleep Apnea, History of ETOH Abuse. Family history of coronary artery disease. Former tobacco use. Hypertension. Dyslipidemia.  ------------------------------------------------------------------- Study Conclusions  - Left ventricle: The cavity size was normal. Wall thickness was  normal. Systolic function was mildly reduced. The estimated  ejection fraction was in the range of 45% to 50%. There is  hypokinesis of the basal-midinferior myocardium. Doppler  parameters are consistent with abnormal left ventricular  relaxation (grade 1 diastolic dysfunction). - Aortic valve: There was trivial regurgitation. - Left atrium: The atrium was mildly dilated. Anterior-posterior  dimension: 42 mm. - Right ventricle: The  cavity size was mildly dilated. Wall  thickness was normal. - Right atrium: The atrium was mildly dilated.  Impressions:  - EF improved when compared to prior (35%)    Radiology/Studies  Ct Heart Morp W/cta Cor W/score W/ca W/cm &/or Wo/cm  08/21/2015  ADDENDUM REPORT: 08/21/2015 13:54 CLINICAL DATA:  Pre Afib Ablation EXAM: Cardiac CTA MEDICATIONS: None TECHNIQUE: The patient was scanned on a [employer]ips [id number] slice scanner. Gantry rotation speed  was 270 msecs. Collimation was .48mm. A 100 kV prospective scan was triggered in the descending thoracic aorta at 111 HU's with 5% padding centered around 78% of the R-R interval. Average HR during the scan was 68 bpm. The 3D data set was interpreted on a dedicated work station using MPR, MIP and VRT modes. A total of 80cc of contrast was used. FINDINGS: There was mild biatrial enlargement. The was a likely PFO. There was no ASD. The RV appeared mildly dilated. The pulmonary veins drained into the LA normally with no anomalies. The LAA was Small and without thrombus. The esophagus coursed closest to the left inferior pulmonary vein. Measurements for the pulmonary veins were as follows LUPV:  Ostium 16.2 mm Area 2.34 cm2 LLPV:  Ostium 15.6 mm Area 2.2 cm2 RUPV:  22.4 mm Area 3.0 cm2 RLPV: 20.9 mm Area 3.0 cm2 there was a large superior branch to this vessel mimicking a RMPV IMPRESSION: 1) Normal pulmonary vein anatomy with no anomalies 2) Possible PFO 3) Small LAA with no thrombus 4) Esophagus courses closest to LLPV 5) Mild biatrial enlargement 6) Mild RV enlargement [place] Rouge Electronically Signed   By: Jenkins Rouge M.D.   On: 08/21/2015 13:54  08/21/2015  EXAM: OVER-READ INTERPRETATION  CT CHEST The following report is an over-read performed by radiologist Dr. Rebekah [facility] on 08/21/2015. This over-read does not include interpretation of cardiac or coronary anatomy or pathology. The coronary calcium score/coronary CTA interpretation by the cardiologist is attached. COMPARISON:  Chest CT 06/12/2010. FINDINGS: Small cluster of peribronchovascular micronodules are noted in the anterior aspect of the right lower lobe, most likely to represent areas of mucoid impaction. Within the visualized portions of the thorax there are no other suspicious appearing pulmonary nodules or masses, there is no acute consolidative airspace disease, no pleural effusions, no pneumothorax and no  lymphadenopathy. Visualized portions of the upper abdomen are unremarkable. Schmorl's nodes in several thoracic vertebral bodies incidentally noted. There are no aggressive appearing lytic or blastic lesions noted in the visualized portions of the skeleton. IMPRESSION: 1. Small cluster of benign appearing peribronchovascular micronodules in the right lower lobe are most compatible with areas of mucoid impaction within terminal bronchioles no imaging follow-up is recommended. This recommendation follows the consensus statement: Guidelines for Management of Incidental Pulmonary Nodules Detected on CT Images:From the Fleischner Society 2017; published online before print (10.1148/radiol.IJ:2314499). 2. No other significant incidental cardiac finding noted. Electronically Signed: By: Vinnie Langton M.D. On: 08/21/2015 13:15    EP study with Atrial fibrillation ablation PREPROCEDURE DIAGNOSES: 1. Paroxysmal atrial fibrillation.  POSTPROCEDURE DIAGNOSES: 1. Paroxysmal atrial fibrillation.  PROCEDURES: 1. Comprehensive electrophysiologic study. 2. Coronary sinus pacing and recording. 3. Three-dimensional mapping of atrial fibrillation  4. Ablation of atrial fibrillation  5. Intracardiac echocardiography. 6. Transseptal puncture of an intact septum. 7. Arrhythmia induction with pacing with dobutamine infusion  CONCLUSIONS: 1. Sinus rhythm upon presentation.  2. 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 Successful electrical isolation and anatomical encircling of  all four pulmonary veins with radiofrequency current. 4. No inducible arrhythmias following ablation both on and off of dobutamine 5. No early apparent complications.    ASSESSMENT AND PLAN  1. PAF s/p ablation  - CT Morph 08/21/2015 normal pulm vein anatomy with no anomalies, possible PFO. Small cluster of benign appearing peribronchovascular micronodules in the right lower  lobe are most compatible with areas of mucoid impaction within terminal bronchioles no imaging follow-up is recommended  - EP study with afib ablation 08/29/2015, see above  - no sign of complication, patient seen with Dr. Curt Bears, no discomfort. Will discharge today.   2. H/o aflutter ablation: note during EP study, complete bidirectional cavotricuspid isthmus block confirmed from prior ablation procedure   Signed, Woodward Ku Pager: [phone]  I have seen and examined this patient with Almyra Deforest.  Agree with above, note added to reflect my findings.  On exam, regular rhythm, no murmurs, lungs clear.  Had AF ablation yesterday with Dr. Rayann Heman.  No complications post procedure.  Plan for discharge today with follow up in AF clinic in one month.      Will M. Camnitz MD 08/30/2015 9:25 AM

## 2015-08-31 ENCOUNTER — Encounter: Payer: Self-pay | Admitting: Internal Medicine

## 2015-09-12 ENCOUNTER — Other Ambulatory Visit: Payer: Self-pay | Admitting: *Deleted

## 2015-09-12 MED ORDER — POTASSIUM CHLORIDE CRYS ER 20 MEQ PO TBCR: EXTENDED_RELEASE_TABLET | ORAL | Status: DC

## 2015-09-15 ENCOUNTER — Other Ambulatory Visit: Payer: Medicare Other

## 2015-09-20 ENCOUNTER — Ambulatory Visit (INDEPENDENT_AMBULATORY_CARE_PROVIDER_SITE_OTHER): Payer: Medicare Other | Admitting: Endocrinology

## 2015-09-20 ENCOUNTER — Encounter: Payer: Self-pay | Admitting: Endocrinology

## 2015-09-20 ENCOUNTER — Other Ambulatory Visit: Payer: Self-pay | Admitting: *Deleted

## 2015-09-20 ENCOUNTER — Other Ambulatory Visit (INDEPENDENT_AMBULATORY_CARE_PROVIDER_SITE_OTHER): Payer: Medicare Other

## 2015-09-20 ENCOUNTER — Encounter: Payer: Medicare Other | Admitting: Endocrinology

## 2015-09-20 VITALS — BP 126/82 | HR 95 | Temp 98.0°F | Resp 16 | Ht 71.75 in | Wt 221.6 lb

## 2015-09-20 DIAGNOSIS — E785 Hyperlipidemia, unspecified: Secondary | ICD-10-CM

## 2015-09-20 DIAGNOSIS — D7589 Other specified diseases of blood and blood-forming organs: Secondary | ICD-10-CM

## 2015-09-20 DIAGNOSIS — Z23 Encounter for immunization: Secondary | ICD-10-CM | POA: Diagnosis not present

## 2015-09-20 DIAGNOSIS — E042 Nontoxic multinodular goiter: Secondary | ICD-10-CM

## 2015-09-20 DIAGNOSIS — R42 Dizziness and giddiness: Secondary | ICD-10-CM | POA: Diagnosis not present

## 2015-09-20 DIAGNOSIS — R7301 Impaired fasting glucose: Secondary | ICD-10-CM | POA: Diagnosis not present

## 2015-09-20 DIAGNOSIS — E559 Vitamin D deficiency, unspecified: Secondary | ICD-10-CM | POA: Diagnosis not present

## 2015-09-20 DIAGNOSIS — Z0000 Encounter for general adult medical examination without abnormal findings: Secondary | ICD-10-CM

## 2015-09-20 DIAGNOSIS — E7800 Pure hypercholesterolemia, unspecified: Secondary | ICD-10-CM

## 2015-09-20 LAB — LIPID PANEL
Cholesterol: 155 mg/dL (ref 0–200)
HDL: 46.6 mg/dL (ref >39.00)
LDL CALC: 96 mg/dL (ref 0–99)
NONHDL: 108.08
Total CHOL/HDL Ratio: 3
Triglycerides: 61 mg/dL (ref 0.0–149.0)
VLDL: 12.2 mg/dL (ref 0.0–40.0)

## 2015-09-20 LAB — CBC
HEMATOCRIT: 42.6 % (ref 39.0–52.0)
HEMOGLOBIN: 14.2 g/dL (ref 13.0–17.0)
MCHC: 33.3 g/dL (ref 30.0–36.0)
MCV: 100.4 fl — AB (ref 78.0–100.0)
Platelets: 115 10*3/uL — ABNORMAL LOW (ref 150.0–400.0)
RBC: 4.25 Mil/uL (ref 4.22–5.81)
RDW: 14.2 % (ref 11.5–15.5)
WBC: 5.3 10*3/uL (ref 4.0–10.5)

## 2015-09-20 LAB — URINALYSIS, ROUTINE W REFLEX MICROSCOPIC
Bilirubin Urine: NEGATIVE
HGB URINE DIPSTICK: NEGATIVE
KETONES UR: 15 — AB
NITRITE: NEGATIVE
RBC / HPF: NONE SEEN (ref 0–?)
SPECIFIC GRAVITY, URINE: 1.025 (ref 1.000–1.030)
Total Protein, Urine: NEGATIVE
URINE GLUCOSE: NEGATIVE
UROBILINOGEN UA: 0.2 (ref 0.0–1.0)
pH: 5.5 (ref 5.0–8.0)

## 2015-09-20 LAB — TSH: TSH: 1.37 u[IU]/mL (ref 0.35–4.50)

## 2015-09-20 LAB — COMPREHENSIVE METABOLIC PANEL
ALK PHOS: 54 U/L (ref 39–117)
ALT: 17 U/L (ref 0–53)
AST: 21 U/L (ref 0–37)
Albumin: 4 g/dL (ref 3.5–5.2)
BUN: 18 mg/dL (ref 6–23)
CHLORIDE: 107 meq/L (ref 96–112)
CO2: 28 mEq/L (ref 19–32)
Calcium: 9 mg/dL (ref 8.4–10.5)
Creatinine, Ser: 0.79 mg/dL (ref 0.40–1.50)
GFR: 100.42 mL/min (ref >60.00)
GLUCOSE: 94 mg/dL (ref 70–99)
POTASSIUM: 4.4 meq/L (ref 3.5–5.1)
SODIUM: 141 meq/L (ref 135–145)
TOTAL PROTEIN: 6.4 g/dL (ref 6.0–8.3)
Total Bilirubin: 1.1 mg/dL (ref 0.2–1.2)

## 2015-09-20 LAB — VITAMIN D 25 HYDROXY (VIT D DEFICIENCY, FRACTURES): VITD: 46.7 ng/mL (ref 30.00–100.00)

## 2015-09-20 LAB — VITAMIN B12: Vitamin B-12: 245 pg/mL (ref 211–911)

## 2015-09-20 NOTE — Progress Notes (Signed)
Quick Note:  Please let patient know that most of the labs are okay. Since B-12 level is low normal would like to try OTC B-12, 500 g daily ______

## 2015-09-20 NOTE — Progress Notes (Signed)
Patient ID: Connor [name], male   DOB: 08-04-1933, 82 y.o.   MRN: [id number]     Subjective:   Patient here for annual preventive exam and management of chronic problems.    Risk factors:     Does have a history of mild impaired fasting glucose levels in the past, had fasting glucose of 102 in 2013 but normal A1c  Mild obesity: He has had difficulty losing weight   Hypercholesterolemia  Recurrent atrial fibrillation  Sleep apnea  Mild systolic LV dysfunction  Roster of Physicians Providing Medical Care to Patient:  See "snapshot" section of EMR  PREVENTIVE CARE:   Diet: reducing portions, avoiding higher fat foods Exercise: Goes to the gym using bike, weights, treadmill up to 40 minutes, 2-3/7 days a week; not doing this since his atrial ablation last month History of falls: None   Annual hemoccults:           2016  vitamin D supplement:                               5000 units 2/7   Colonoscopy/sigmoidoscopy 2011  Prevnar  2015  Yearly flu vaccine:                      yes    Zostavax:  2008   Pneumovax:  2000  Tetanus booster:   ? 1992    Lipid screening, last levels:  Lab Results  Component Value Date   CHOL 155 09/20/2015   HDL 46.60 09/20/2015   LDLCALC 96 09/20/2015   TRIG 61.0 09/20/2015   CHOLHDL 3 09/20/2015   Has been maintained on Lipitor for several years for hypercholesterolemia with baseline LDL levels up to 189  Activities of Daily Living: In his present state of health, has no difficulty performing daily activities   Home Safety: Has smoke detector and wears seat belts.   Depression Screen  See relevant section in EMR  Review of Systems:    Weight gain: He has difficulty losing the weight in the past  This year has been able to lose 7 pounds with consistent diet and exercise   Wt Readings from Last 3 Encounters:  09/20/15 221 lb 9.6 oz (100.517 kg)  08/30/15 220 lb 10.9 oz (100.1 kg)  07/20/15 228 lb 3.2 oz (103.511 kg)      Has  history of mild hypertension, treated with metoprolol ER 50 mg daily Taking this partly because of a history of atrial fibrillation   Checking blood pressure almost daily at home and recent readings are ranging from 121-149/68-80, however using the [employer] wrist monitor now since his arm monitor was inaccurate when he was having atrial fibrillation  HYPERLIPIDEMIA: : has had LDL levels up to 189 prior to treatment.   Has been careful with reducing high-fat meals.  LDL controlled with Lipitor for several years    Lab Results  Component Value Date   CHOL 155 09/20/2015   HDL 46.60 09/20/2015   LDLCALC 96 09/20/2015   TRIG 61.0 09/20/2015   CHOLHDL 3 09/20/2015       Eyes: Normal, followed by ophthalmologist every 6 months.        ENT: Decreased hearing especially if background noise present. Using bilateral hearing aids with some improvement                    Thyroid:    No unusual  fatigue.  Has had a small multinodular goiter since at least 1998, has been euthyroid  Lab Results  Component Value Date   TSH 1.37 09/20/2015       No chest pain on exertion.                      Has periodic palpitations from recurrent atrial fibrillation. is taking Tikosyn and is on Xarelto followed by cardiologist regularly He has not had any issues since he had the ablation done in 4/17      No swelling of feet       No shortness of breath on exertion.     Bowel habits:  No change.   He has only rare heartburn.  Taking OTC Prilosec mostly prophylactically because of remote history of GI bleed possibly related to Daypro     Rectal bleeding/black stools: Not present.     Prior history of meatal stenosis and doing self-catheterization previously, no difficulty with urine flow now and continues on Flomax for BPH from urologist who he will be seeing in 8/16. Has no significant nocturia or frequency/urgency. Commonly.  May occur.  Lungs are twice at night    Neurological: Episodes of  lightheadedness/dizziness since about 2000.     Periodically he will feel symptoms of slightly dizzy but not swimmy headed and a  sensation of being off balance Previously had been sent for physical therapy for the cervical spine.  He thinks that when he presses on the back of the neck on the muscle this relieves his symptoms  No numbness or tingling in his feet  Has had back pain from degenerated discs at L2-3, 3-4 and 4-5 levels and scoliosis. Has been taking Tylenol or  tramadol for relief         Medication List       This list is accurate as of: 09/20/15  1:43 PM.  Always use your most recent med list.               acetaminophen 500 MG tablet  Commonly known as:  TYLENOL  Take 1,000 mg by mouth 2 (two) times daily as needed for moderate pain.     atorvastatin 10 MG tablet  Commonly known as:  LIPITOR  TAKE 1 TABLET (10 MG TOTAL) BY MOUTH DAILY.     clindamycin 1 % external solution  Commonly known as:  CLEOCIN T  Apply 1 application topically daily as needed (for bumps in scalp). Apply to scalp daily as needed     CVS SLEEP AID NIGHTTIME PO  Take 1 tablet by mouth at bedtime.     ICAPS PO  Take 1 tablet by mouth daily.     methocarbamol 500 MG tablet  Commonly known as:  ROBAXIN  Take 1 tablet (500 mg total) by mouth at bedtime as needed (pain).     metoprolol succinate 50 MG 24 hr tablet  Commonly known as:  TOPROL-XL  Take 1 tablet (50 mg total) by mouth daily.     metoprolol tartrate 25 MG tablet  Commonly known as:  LOPRESSOR  TAKE 1 TABLET EVERY 6 HOURS AS NEEDED FOR FAST HEART RATES     multivitamin Liqd  Take 5 mLs by mouth daily.     OMEGA 3 PO  Take 3 capsules by mouth daily.     omeprazole 20 MG capsule  Commonly known as:  PRILOSEC  Take 20 mg by mouth daily. Pt takes 1 tablet by  mouth every day     OVER THE COUNTER MEDICATION  Place 3 drops into both eyes daily as needed (dry eyes). "thera tears"     potassium chloride SA 20 MEQ tablet   Commonly known as:  KLOR-CON M20  TAKE 2 TABLETS (40MEQ) BY MOUTH IN THE MORNING AND 2 TABLETS (40MEQ) IN THE EVENING.     STOOL SOFTENER PO  Take 1 capsule by mouth daily as needed (constipation). Reported on 09/20/2015     tamsulosin 0.4 MG Caps capsule  Commonly known as:  FLOMAX  TAKE ONE CAPSULE BY MOUTH ONCE DAILY 30 MINUTES AFTER THE SAME MEAL     TIKOSYN 500 MCG capsule  Generic drug:  dofetilide  TAKE 1 CAPSULE BY MOUTH EVERY 12 HOURS     traMADol 50 MG tablet  Commonly known as:  ULTRAM  Take 1 tablet (50 mg total) by mouth every 6 (six) hours as needed (pain).     Vitamin D3 5000 units Tabs  Take 5,000 Units by mouth as directed. Pt takes 1 tablet by mouth every 4 days     XARELTO 20 MG Tabs tablet  Generic drug:  rivaroxaban  TAKE 1 TABLET BY MOUTH EVERY DAY        Allergies:  Allergies  Allergen Reactions  . Penicillins Itching and Swelling    Has patient had a PCN reaction causing immediate rash, facial/tongue/throat swelling, SOB or lightheadedness with hypotension: Yes Has patient had a PCN reaction causing severe rash involving mucus membranes or skin necrosis: No Has patient had a PCN reaction that required hospitalization No Has patient had a PCN reaction occurring within the last 10 years: No If all of the above answers are "NO", then may proceed with Cephalosporin use.     Past Medical History  Diagnosis Date  . Hypertension   . Atrial flutter [facility])     s/p ablation in March 2012  . Hyperlipidemia   . Vitamin D deficiency   . RBBB (right bundle branch block)   . BPH (benign prostatic hyperplasia)   . Osteoarthritis   . Anemia   . Peptic ulcer disease   . DVT (deep venous thrombosis) ([place]) 2/12    in setting of R hip surgery, he developed R femoral DVT  . Chronic anticoagulation     on Xarelto for afib  . Abnormal nuclear cardiac imaging test 1/13    Abnormal stress test 05/2011, but cardiac cath 06/2011 showing normal coronary anatomy.  .  Paroxysmal atrial fibrillation ([place]) 9/12    a. documented by Dr Barrett Shell on office EKG 9/12. b. maintained on Tikosyn and Xarelto. c. s/p DCCV 10/2012.  Marland Kitchen Premature ventricular contraction   . Obstructive sleep apnea     compliant with CPAP.  managed by Dr Elsworth Soho.  . Prediabetes     Past Surgical History  Procedure Laterality Date  . Total hip arthroplasty  06/04/10    RIGHT HIP  . Total knee arthroplasty    . Transurethral resection of prostate    . Atrial flutter ablation  5/12    by JA  . Cardiac catheterization    . Cardioversion N/A 10/28/2012    Procedure: CARDIOVERSION;  Surgeon: Burnell Blanks, MD;  Location: [place];  Service: Cardiovascular;  Laterality: N/A;  . Left heart catheterization with coronary angiogram N/A 06/07/2011    Procedure: LEFT HEART CATHETERIZATION WITH CORONARY ANGIOGRAM;  Surgeon: Peter M Martinique, MD;  Location: [facility] CATH LAB;  Service: Cardiovascular;  Laterality: N/A;  .  Ablation of dysrhythmic focus  08/29/2015  . Electrophysiologic study N/A 08/29/2015    Procedure: Atrial Fibrillation Ablation;  Surgeon: [name], MD;  Location: [place] CV LAB;  Service: Cardiovascular;  Laterality: N/A;    Family History  Problem Relation Age of Onset  . Heart attack Father   . Lung disease Mother   . Atrial fibrillation Brother   . Arrhythmia Sister   . Diabetes Neg Hx     Social History:  reports that he quit smoking about 37 years ago. His smoking use included Cigarettes. He has a 30 pack-year smoking history. He has never used smokeless tobacco. He reports that he drinks about 3.0 oz of alcohol per week. He reports that he does not use illicit drugs.    EXAM:  BP 126/82 mmHg  Pulse 95  Temp(Src) 98 F (36.7 C)  Resp 16  Ht 5' 11.75" (1.822 m)  Wt 221 lb 9.6 oz (100.517 kg)  BMI 30.28 kg/m2  SpO2 97%  Blood pressure with patient's home wrist monitor = 142/86  GENERAL:  Large build, well-nourished, pleasant, looks well  No pallor, clubbing,  lymphadenopathy or peripheral edema.   Skin:  no rash or pigmentation.   EYES:  Externally normal.  Fundii:  normal discs and vessels, limited exam because of smaller pupils.  ENT:  oral cavity and pharynx normal. Tympanic membranes are dull, especially on the left    THYROID:   just palpable on the lower right lobe, firm , slightly nodular  CAROTIDS:  Normal character; no bruit.  HEART:  Normal apex, S1 normal, S2 normal, no S3/S4 ; no murmur.  CHEST:  Normal shape.  Lungs:  Vescicular breath sounds bilaterally.  No crepitations/ wheeze.  ABDOMEN:  No distention. no abnormal epigastric pulsation.   Liver and spleen not palpable.  No other mass or tenderness.  RECTAL exam :   Deferred to urologist  NEUROLOGICAL:  Romberg sign: Initially positive, on second attempt nearly normal Muscle power grossly normal in all 4 extremities  Biceps reflexes are normal, and ankle reflexes absent  Vibration sense moderately reduced in the toes  SPINE AND JOINTS:  Mild scoliosis lumbar spine. Has clawfoot bilaterally    PERIPHERAL PULSES: pedal pulses normal    Assessment/Plan:   During the course of the visit the patient was educated and counseled about appropriate screening and preventive services including:   Diabetes screening which will be done today  Nutrition counseling : low fat diet  Vaccines:  PNEUMOVAX vaccine booster given today Screening for colon cancer:   Colonoscopy will be scheduled by the patient, has been postponed this year because of his ablation procedure Not a candidate for PSA testing with his age Needs followup for annual physical exam once a year Resume regular exercise regimen at least 3 days a week when allowed by cardiologist  Active medical problems and recommendations:  Hyperlipidemia: His lipids will be checked today, recently under good level and he will continue Lipitor 10 mg daily for hypercholesterolemia  History of impaired fasting glucose: Will check  fasting glucose  A1c today  Hypertension: This is relatively mild Blood pressure is well controlled with metoprolol alone.  However advised him to start using the arm coffee instead of the wrist instrument as this is giving falsely high readings at times  Dizziness/lightheadedness: Probably be related to cervical spondylosis but also may have decreased position sense.  He wants to go to a physical therapist for treatment.  Also will check B12 level  BPH and history of meatal stenosis: Symptoms are decreased now To continue followup with urologist annually  Atrial fibrillation: Appears to be controlled now after ablation procedure.  Heart rate controlled with metoprolol   Small multinodular goiter:  TSH is to be checked today   History of vitamin D deficiency: We will need follow-up level today.  Also previously has had some decreased calcium levels and may need to consider calcium supplementation  Weight gain: He is losing weight now with consistent diet and exercise over the last few months  Chronic PPI therapy: He can go back to taking this 2-3 times a week when allowed by cardiologist  [facility] 09/20/2015, 1:43 PM

## 2015-09-24 ENCOUNTER — Other Ambulatory Visit: Payer: Self-pay | Admitting: Endocrinology

## 2015-09-27 ENCOUNTER — Encounter (HOSPITAL_COMMUNITY): Payer: Self-pay | Admitting: Nurse Practitioner

## 2015-09-27 ENCOUNTER — Ambulatory Visit (HOSPITAL_COMMUNITY)
Admission: RE | Admit: 2015-09-27 | Discharge: 2015-09-27 | Disposition: A | Discharge status: Home / Self Care | Payer: Medicare Other | Source: Ambulatory Visit | Attending: Nurse Practitioner | Admitting: Nurse Practitioner

## 2015-09-27 VITALS — BP 122/60 | HR 76 | Ht 72.0 in | Wt 222.8 lb

## 2015-09-27 DIAGNOSIS — Z880 Allergy status to penicillin: Secondary | ICD-10-CM | POA: Diagnosis not present

## 2015-09-27 DIAGNOSIS — Z7901 Long term (current) use of anticoagulants: Secondary | ICD-10-CM | POA: Diagnosis not present

## 2015-09-27 DIAGNOSIS — N400 Enlarged prostate without lower urinary tract symptoms: Secondary | ICD-10-CM | POA: Diagnosis not present

## 2015-09-27 DIAGNOSIS — I4891 Unspecified atrial fibrillation: Secondary | ICD-10-CM | POA: Diagnosis present

## 2015-09-27 DIAGNOSIS — I4510 Unspecified right bundle-branch block: Secondary | ICD-10-CM | POA: Insufficient documentation

## 2015-09-27 DIAGNOSIS — Z86718 Personal history of other venous thrombosis and embolism: Secondary | ICD-10-CM | POA: Insufficient documentation

## 2015-09-27 DIAGNOSIS — I10 Essential (primary) hypertension: Secondary | ICD-10-CM | POA: Diagnosis not present

## 2015-09-27 DIAGNOSIS — Z79899 Other long term (current) drug therapy: Secondary | ICD-10-CM | POA: Insufficient documentation

## 2015-09-27 DIAGNOSIS — Z79891 Long term (current) use of opiate analgesic: Secondary | ICD-10-CM | POA: Diagnosis not present

## 2015-09-27 DIAGNOSIS — Z9889 Other specified postprocedural states: Secondary | ICD-10-CM | POA: Insufficient documentation

## 2015-09-27 DIAGNOSIS — Z8249 Family history of ischemic heart disease and other diseases of the circulatory system: Secondary | ICD-10-CM | POA: Insufficient documentation

## 2015-09-27 DIAGNOSIS — G4733 Obstructive sleep apnea (adult) (pediatric): Secondary | ICD-10-CM | POA: Insufficient documentation

## 2015-09-27 DIAGNOSIS — Z836 Family history of other diseases of the respiratory system: Secondary | ICD-10-CM | POA: Diagnosis not present

## 2015-09-27 DIAGNOSIS — E559 Vitamin D deficiency, unspecified: Secondary | ICD-10-CM | POA: Diagnosis not present

## 2015-09-27 DIAGNOSIS — M1990 Unspecified osteoarthritis, unspecified site: Secondary | ICD-10-CM | POA: Insufficient documentation

## 2015-09-27 DIAGNOSIS — I480 Paroxysmal atrial fibrillation: Secondary | ICD-10-CM | POA: Insufficient documentation

## 2015-09-27 DIAGNOSIS — E785 Hyperlipidemia, unspecified: Secondary | ICD-10-CM | POA: Diagnosis not present

## 2015-09-27 DIAGNOSIS — Z833 Family history of diabetes mellitus: Secondary | ICD-10-CM | POA: Diagnosis not present

## 2015-09-27 DIAGNOSIS — Z96641 Presence of right artificial hip joint: Secondary | ICD-10-CM | POA: Diagnosis not present

## 2015-09-27 DIAGNOSIS — Z87891 Personal history of nicotine dependence: Secondary | ICD-10-CM | POA: Diagnosis not present

## 2015-09-27 DIAGNOSIS — R7303 Prediabetes: Secondary | ICD-10-CM | POA: Insufficient documentation

## 2015-09-27 NOTE — Progress Notes (Signed)
Patient ID: Connor [name], male   DOB: 05/02/1934, 82 y.o.   MRN: [id number]     Primary Care Physician: Elayne Snare, MD Referring Physician: Dr. [name] OMIR GRIFFIN is a 82 y.o. male with a h/o PAF that recently underwent repeat afib ablation 08/29/15 for f/u in the afib clinic.He reports just one epiosode of afib a few days after ablation. Otherwise staying in [place]. Continues on tikosyn/xarelto without issues.No groin or swallowing issues since procedure.   Today, he denies symptoms of palpitations, chest pain, shortness of breath, orthopnea, PND, lower extremity edema, dizziness, presyncope, syncope, or neurologic sequela. The patient is tolerating medications without difficulties and is otherwise without complaint today.   Past Medical History  Diagnosis Date  . Hypertension   . Atrial flutter [facility])     s/p ablation in March 2012  . Hyperlipidemia   . Vitamin D deficiency   . RBBB (right bundle branch block)   . BPH (benign prostatic hyperplasia)   . Osteoarthritis   . Anemia   . Peptic ulcer disease   . DVT (deep venous thrombosis) ([place]) 2/12    in setting of R hip surgery, he developed R femoral DVT  . Chronic anticoagulation     on Xarelto for afib  . Abnormal nuclear cardiac imaging test 1/13    Abnormal stress test 05/2011, but cardiac cath 06/2011 showing normal coronary anatomy.  . Paroxysmal atrial fibrillation ([place]) 9/12    a. documented by Dr Barrett Shell on office EKG 9/12. b. maintained on Tikosyn and Xarelto. c. s/p DCCV 10/2012.  Marland Kitchen Premature ventricular contraction   . Obstructive sleep apnea     compliant with CPAP.  managed by Dr Elsworth Soho.  . Prediabetes    Past Surgical History  Procedure Laterality Date  . Total hip arthroplasty  06/04/10    RIGHT HIP  . Total knee arthroplasty    . Transurethral resection of prostate    . Atrial flutter ablation  5/12    by JA  . Cardiac catheterization    . Cardioversion N/A 10/28/2012    Procedure: CARDIOVERSION;  Surgeon:  Burnell Blanks, MD;  Location: [place];  Service: Cardiovascular;  Laterality: N/A;  . Left heart catheterization with coronary angiogram N/A 06/07/2011    Procedure: LEFT HEART CATHETERIZATION WITH CORONARY ANGIOGRAM;  Surgeon: Peter M Martinique, MD;  Location: [facility] CATH LAB;  Service: Cardiovascular;  Laterality: N/A;  . Ablation of dysrhythmic focus  08/29/2015  . Electrophysiologic study N/A 08/29/2015    Procedure: Atrial Fibrillation Ablation;  Surgeon: [name], MD;  Location: [place] CV LAB;  Service: Cardiovascular;  Laterality: N/A;    Current Outpatient Prescriptions  Medication Sig Dispense Refill  . acetaminophen (TYLENOL) 500 MG tablet Take 1,000 mg by mouth 2 (two) times daily as needed for moderate pain.    Marland Kitchen atorvastatin (LIPITOR) 10 MG tablet TAKE 1 TABLET BY MOUTH EVERY DAY 90 tablet 1  . Cholecalciferol (VITAMIN D3) 5000 UNITS TABS Take 5,000 Units by mouth as directed. Pt takes 1 tablet by mouth every 4 days    . DiphenhydrAMINE HCl, Sleep, ([facility]S SLEEP AID NIGHTTIME PO) Take 1 tablet by mouth at bedtime.    Mariane Baumgarten Calcium (STOOL SOFTENER PO) Take 1 capsule by mouth daily as needed (constipation). Reported on 09/20/2015    . methocarbamol (ROBAXIN) 500 MG tablet Take 1 tablet (500 mg total) by mouth at bedtime as needed (pain). 30 tablet 3  . metoprolol succinate (TOPROL-XL) 50 MG  24 hr tablet Take 1 tablet (50 mg total) by mouth daily. 90 tablet 3  . metoprolol tartrate (LOPRESSOR) 25 MG tablet TAKE 1 TABLET EVERY 6 HOURS AS NEEDED FOR FAST HEART RATES 30 tablet 3  . Multiple Vitamin (MULITIVITAMIN) LIQD Take 5 mLs by mouth daily.    . Multiple Vitamins-Minerals (ICAPS PO) Take 1 tablet by mouth daily.     . Omega-3 Fatty Acids (OMEGA 3 PO) Take 3 capsules by mouth daily.     Marland Kitchen omeprazole (PRILOSEC) 20 MG capsule Take 20 mg by mouth daily. Pt takes 1 tablet by mouth every day    . OVER THE COUNTER MEDICATION Place 3 drops into both eyes daily as needed (dry eyes).  "thera tears"    . potassium chloride SA (KLOR-CON M20) 20 MEQ tablet TAKE 2 TABLETS (40MEQ) BY MOUTH IN THE MORNING AND 2 TABLETS (40MEQ) IN THE EVENING. 360 tablet 2  . tamsulosin (FLOMAX) 0.4 MG CAPS capsule TAKE ONE CAPSULE BY MOUTH ONCE DAILY 30 MINUTES AFTER THE SAME MEAL 90 capsule 1  . TIKOSYN 500 MCG capsule TAKE 1 CAPSULE BY MOUTH EVERY 12 HOURS 180 capsule 2  . traMADol (ULTRAM) 50 MG tablet Take 1 tablet (50 mg total) by mouth every 6 (six) hours as needed (pain). 60 tablet 5  . vitamin B-12 (CYANOCOBALAMIN) 500 MCG tablet Take 500 mcg by mouth daily.    Alveda Reasons 20 MG TABS tablet TAKE 1 TABLET BY MOUTH EVERY DAY 90 tablet 3  . clindamycin (CLEOCIN T) 1 % external solution Apply 1 application topically daily as needed (for bumps in scalp). Apply to scalp daily as needed  2   No current facility-administered medications for this encounter.    Allergies  Allergen Reactions  . Penicillins Itching and Swelling    Has patient had a PCN reaction causing immediate rash, facial/tongue/throat swelling, SOB or lightheadedness with hypotension: Yes Has patient had a PCN reaction causing severe rash involving mucus membranes or skin necrosis: No Has patient had a PCN reaction that required hospitalization No Has patient had a PCN reaction occurring within the last 10 years: No If all of the above answers are "NO", then may proceed with Cephalosporin use.     Social History   Social History  . Marital Status: Married    Spouse Name: N/A  . Number of Children: N/A  . Years of Education: N/A   Occupational History  . Not on file.   Social History Main Topics  . Smoking status: Former Smoker -- 1.00 packs/day for 30 years    Types: Cigarettes    Quit date: 05/06/1978  . Smokeless tobacco: Never Used  . Alcohol Use: 3.0 oz/week    5 Cans of beer per week     Comment: he has just about quit  . Drug Use: No  . Sexual Activity: Not on file   Other Topics Concern  . Not on file     Social History Narrative   he patient lives in [place] with his spouse.  Retired.    Family History  Problem Relation Age of Onset  . Heart attack Father   . Lung disease Mother   . Atrial fibrillation Brother   . Arrhythmia Sister   . Diabetes Neg Hx     ROS- All systems are reviewed and negative except as per the HPI above  Physical Exam: Filed Vitals:   09/27/15 1011  BP: 122/60  Pulse: 76  Height: 6' (1.829 m)  Weight: 222 lb 12.8 oz (101.061 kg)    GEN- The patient is well appearing, alert and oriented x 3 today.   Head- normocephalic, atraumatic Eyes-  Sclera clear, conjunctiva pink Ears- hearing intact Oropharynx- clear Neck- supple, no JVP Lymph- no cervical lymphadenopathy Lungs- Clear to ausculation bilaterally, normal work of breathing Heart- Regular rate and rhythm, no murmurs, rubs or gallops, PMI not laterally displaced GI- soft, NT, ND, + BS Extremities- no clubbing, cyanosis, or edema MS- no significant deformity or atrophy Skin- no rash or lesion Psych- euthymic mood, full affect Neuro- strength and sensation are intact  EKG- NSR with RBBB, pr int 154 ms, qrs int 146 ms, qtc 501 ms(qtc stable for pt) Epic records reviewed  Assessment and Plan: 1. PAF Stable after ablation maintaining SR Continue tikosyn,xarelto CMP drawn 5/17 with K+at 4.4, Mag stable at 2.2 3/13  F/u with Dr. Rayann Heman 7/26  afib clinic as needed

## 2015-10-03 ENCOUNTER — Telehealth: Payer: Self-pay | Admitting: Endocrinology

## 2015-10-03 ENCOUNTER — Encounter: Payer: Self-pay | Admitting: Endocrinology

## 2015-10-03 NOTE — Telephone Encounter (Signed)
Detailed message left on patients answering machine

## 2015-10-03 NOTE — Telephone Encounter (Signed)
Pl have him call

## 2015-10-03 NOTE — Telephone Encounter (Signed)
FYI  Please see below

## 2015-10-03 NOTE — Telephone Encounter (Signed)
[facility] called and said they were unable to reach the PT to schedule therapy after several attempts.

## 2015-10-05 ENCOUNTER — Ambulatory Visit: Payer: Medicare Other | Admitting: Cardiovascular Disease

## 2015-10-08 ENCOUNTER — Other Ambulatory Visit: Payer: Self-pay | Admitting: Endocrinology

## 2015-10-13 ENCOUNTER — Other Ambulatory Visit: Payer: Self-pay

## 2015-10-13 MED ORDER — TRAMADOL HCL 50 MG PO TABS: 50.0000 mg | ORAL_TABLET | Freq: Four times a day (QID) | ORAL | Status: DC | PRN

## 2015-11-29 ENCOUNTER — Encounter: Payer: Self-pay | Admitting: Internal Medicine

## 2015-11-29 ENCOUNTER — Ambulatory Visit (INDEPENDENT_AMBULATORY_CARE_PROVIDER_SITE_OTHER): Payer: Medicare Other | Admitting: Internal Medicine

## 2015-11-29 VITALS — BP 120/82 | HR 77 | Ht 72.0 in | Wt 220.6 lb

## 2015-11-29 DIAGNOSIS — I519 Heart disease, unspecified: Secondary | ICD-10-CM

## 2015-11-29 DIAGNOSIS — G4733 Obstructive sleep apnea (adult) (pediatric): Secondary | ICD-10-CM | POA: Diagnosis not present

## 2015-11-29 DIAGNOSIS — I480 Paroxysmal atrial fibrillation: Secondary | ICD-10-CM | POA: Diagnosis not present

## 2015-11-29 DIAGNOSIS — I5022 Chronic systolic (congestive) heart failure: Secondary | ICD-10-CM

## 2015-11-29 MED ORDER — METOPROLOL SUCCINATE ER 50 MG PO TB24: ORAL_TABLET | ORAL | 3 refills | Status: DC

## 2015-11-29 NOTE — Patient Instructions (Signed)
Medication Instructions:  Your physician has recommended you make the following change in your medication:  1) Decrease Metoprolol to 25 mg daily   Labwork: None ordered   Testing/Procedures: Your physician has requested that you have an echocardiogram. Echocardiography is a painless test that uses sound waves to create images of your heart. It provides your doctor with information about the size and shape of your heart and how well your heart's chambers and valves are working. This procedure takes approximately one hour. There are no restrictions for this procedure. ----in 3 months prior to the office visit    Follow-Up: Your physician recommends that you schedule a follow-up appointment in: 3 months with Dr Rayann Heman   Any Other Special Instructions Will Be Listed Below (If Applicable).     If you need a refill on your cardiac medications before your next appointment, please call your pharmacy.

## 2015-11-29 NOTE — Progress Notes (Signed)
Electrophysiology Office Note   Date:  11/29/2015   ID:  Connor [name], DOB December 03, 1933, MRN [id number]  PCP:  Elayne Snare, MD  Cardiologist:   (previously Mare Ferrari) Primary Electrophysiologist:  [name], MD    Chief Complaint  Patient presents with  . Atrial Fibrillation     History of Present Illness: Connor [name] is a 82 y.o. male who presents today for electrophysiology follow-up.  Since his recent ablation, he has done very well.  He denies any symptoms of afib.  Denies procedure related complications.  Today, he denies symptoms of chest pain, shortness of breath, orthopnea, PND, lower extremity edema, claudication, dizziness, presyncope, syncope, bleeding, or neurologic sequela. The patient is tolerating medications without difficulties and is otherwise without complaint today.    Past Medical History:  Diagnosis Date  . Abnormal nuclear cardiac imaging test 1/13   Abnormal stress test 05/2011, but cardiac cath 06/2011 showing normal coronary anatomy.  . Anemia   . Atrial flutter [facility])    s/p ablation in March 2012  . BPH (benign prostatic hyperplasia)   . Chronic anticoagulation    on Xarelto for afib  . DVT (deep venous thrombosis) ([place]) 2/12   in setting of R hip surgery, he developed R femoral DVT  . Hyperlipidemia   . Hypertension   . Obstructive sleep apnea    compliant with CPAP.  managed by Dr Elsworth Soho.  . Osteoarthritis   . Paroxysmal atrial fibrillation ([place]) 9/12   a. documented by Dr Barrett Shell on office EKG 9/12. b. maintained on Tikosyn and Xarelto. c. s/p DCCV 10/2012.  Marland Kitchen Peptic ulcer disease   . Prediabetes   . Premature ventricular contraction   . RBBB (right bundle branch block)   . Vitamin D deficiency    Past Surgical History:  Procedure Laterality Date  . ABLATION OF DYSRHYTHMIC FOCUS  08/29/2015  . atrial flutter ablation  5/12   by JA  . CARDIAC CATHETERIZATION    . CARDIOVERSION N/A 10/28/2012   Procedure: CARDIOVERSION;  Surgeon:  Burnell Blanks, MD;  Location: [place];  Service: Cardiovascular;  Laterality: N/A;  . ELECTROPHYSIOLOGIC STUDY N/A 08/29/2015   Procedure: Atrial Fibrillation Ablation;  Surgeon: [name], MD;  Location: [place] CV LAB;  Service: Cardiovascular;  Laterality: N/A;  . LEFT HEART CATHETERIZATION WITH CORONARY ANGIOGRAM N/A 06/07/2011   Procedure: LEFT HEART CATHETERIZATION WITH CORONARY ANGIOGRAM;  Surgeon: Peter M Martinique, MD;  Location: [facility] C[facility] LAB;  Service: Cardiovascular;  Laterality: N/A;  . TOTAL HIP ARTHROPLASTY  06/04/10   RIGHT HIP  . TOTAL KNEE ARTHROPLASTY    . TRANSURETHRAL RESECTION OF PROSTATE       Current Outpatient Prescriptions  Medication Sig Dispense Refill  . acetaminophen (TYLENOL) 500 MG tablet Take 1,000 mg by mouth 2 (two) times daily as needed for moderate pain.    Marland Kitchen atorvastatin (LIPITOR) 10 MG tablet TAKE 1 TABLET BY MOUTH EVERY DAY 90 tablet 1  . Cholecalciferol (VITAMIN D3) 5000 UNITS TABS Take 5,000 Units by mouth as directed. Pt takes 1 tablet by mouth every 4 days    . clindamycin (CLEOCIN T) 1 % external solution Apply 1 application topically daily as needed (for bumps in scalp). Apply to scalp daily as needed  2  . DiphenhydrAMINE HCl, Sleep, ([employer] SLEEP AID NIGHTTIME PO) Take 1 tablet by mouth at bedtime.    Mariane Baumgarten Calcium (STOOL SOFTENER PO) Take 1 capsule by mouth daily as needed (constipation). Reported on 09/20/2015    .  methocarbamol (ROBAXIN) 500 MG tablet Take 1 tablet (500 mg total) by mouth at bedtime as needed (pain). 30 tablet 3  . metoprolol succinate (TOPROL-XL) 50 MG 24 hr tablet Take 1 tablet (50 mg total) by mouth daily. 90 tablet 3  . metoprolol tartrate (LOPRESSOR) 25 MG tablet TAKE 1 TABLET BY MOUTH EVERY 6 HOURS AS NEEDED FOR FAST HEART RATES    . Multiple Vitamin (MULITIVITAMIN) LIQD Take 5 mLs by mouth daily.    . Multiple Vitamins-Minerals (ICAPS PO) Take 1 tablet by mouth daily.     . Omega-3 Fatty Acids (OMEGA 3 PO) Take  3 capsules by mouth daily.     Marland Kitchen omeprazole (PRILOSEC) 20 MG capsule Take 20 mg by mouth daily. Pt takes 1 tablet by mouth every day    . OVER THE COUNTER MEDICATION Place 3 drops into both eyes daily as needed (dry eyes). "thera tears"    . potassium chloride SA (KLOR-CON M20) 20 MEQ tablet TAKE 2 TABLETS (40MEQ) BY MOUTH IN THE MORNING AND 2 TABLETS (40MEQ) IN THE EVENING. 360 tablet 2  . tamsulosin (FLOMAX) 0.4 MG CAPS capsule TAKE ONE CAPSULE BY MOUTH ONCE DAILY 30 MINUTES AFTER THE SAME MEAL 90 capsule 1  . TIKOSYN 500 MCG capsule TAKE 1 CAPSULE BY MOUTH EVERY 12 HOURS 180 capsule 2  . traMADol (ULTRAM) 50 MG tablet Take 1 tablet (50 mg total) by mouth every 6 (six) hours as needed (pain). 60 tablet 5  . vitamin B-12 (CYANOCOBALAMIN) 500 MCG tablet Take 500 mcg by mouth daily.    Alveda Reasons 20 MG TABS tablet TAKE 1 TABLET BY MOUTH EVERY DAY 90 tablet 3   No current facility-administered medications for this visit.     Allergies:   Penicillins   Social History:  The patient  reports that he quit smoking about 37 years ago. His smoking use included Cigarettes. He has a 30.00 pack-year smoking history. He has never used smokeless tobacco. He reports that he drinks about 3.0 oz of alcohol per week . He reports that he does not use drugs.   Family History:  The patient's family history includes Arrhythmia in his sister; Atrial fibrillation in his brother; Heart attack in his father; Lung disease in his mother.    ROS:  Please see the history of present illness.   All other systems are reviewed and negative.    PHYSICAL EXAM: VS:  BP 120/82   Pulse 77   Ht 6' (1.829 m)   Wt 220 lb 9.6 oz (100.1 kg)   BMI 29.92 kg/m  , BMI Body mass index is 29.92 kg/m. GEN: Well nourished, well developed, in no acute distress  HEENT: normal  Neck: no JVD, carotid bruits, or masses Cardiac: RRR; no murmurs, rubs, or gallops,no edema  Respiratory:  clear to auscultation bilaterally, normal work of  breathing GI: soft, nontender, nondistended, + BS MS: no deformity or atrophy  Skin: warm and dry  Neuro:  Strength and sensation are intact Psych: euthymic mood, full affect  EKG:  EKG is ordered today. The ekg ordered today shows sinus rhythm 77 bpm, PR 166 msec, with RBBB, Qtc 518   Recent Labs: 07/14/2015: Magnesium 2.2 09/20/2015: ALT 17; BUN 18; Creatinine, Ser 0.79; Hemoglobin 14.2; Platelets 115.0; Potassium 4.4; Sodium 141; TSH 1.37    Lipid Panel     Component Value Date/Time   CHOL 155 09/20/2015 0908   TRIG 61.0 09/20/2015 0908   HDL 46.60 09/20/2015 0908  CHOLHDL 3 09/20/2015 0908   VLDL 12.2 09/20/2015 0908   LDLCALC 96 09/20/2015 0908     Wt Readings from Last 3 Encounters:  11/29/15 220 lb 9.6 oz (100.1 kg)  09/27/15 222 lb 12.8 oz (101.1 kg)  09/20/15 221 lb 9.6 oz (100.5 kg)      Other studies Reviewed: Additional studies/ records that were reviewed today include: echo 2014  Review of the above records today demonstrates: LA 40 mm   ASSESSMENT AND PLAN:  1.  Paroxysmal atrial fibrillation Doing very well s/p ablation Reduce toprol to 25mg  daily No other changes today chad2vasc is at least 4.  He has also had prior DVT.  Would continue life long anticoagulation Continue tikosyn for now  2. OSA Compliance with CPAP is advised  3. HTN Stable No change required today  4. Nonischemic CM Repeat echo upon follow-up in 3 months If not improved, will titrate medical therapy  Current medicines are reviewed at length with the patient today.   The patient does not have concerns regarding his medicines.  The following changes were made today:  none   Signed, [name], MD  11/29/2015 10:15 AM     [facility] [address] [place] [address] [phone] (office) [phone] (fax)

## 2015-12-20 ENCOUNTER — Other Ambulatory Visit: Payer: Self-pay | Admitting: *Deleted

## 2015-12-20 MED ORDER — DOFETILIDE 500 MCG PO CAPS: ORAL_CAPSULE | ORAL | 3 refills | Status: DC

## 2015-12-21 ENCOUNTER — Telehealth: Payer: Self-pay | Admitting: Endocrinology

## 2015-12-21 NOTE — Telephone Encounter (Signed)
See note below

## 2015-12-21 NOTE — Telephone Encounter (Signed)
Neoma Laming, Could you please review the status of this referral. Dr. Dwyane Dee had placed the referral back in May of 2017. Thanks!   (pt advised this message has been sent to our patient care coordinator to review)

## 2015-12-21 NOTE — Telephone Encounter (Signed)
I had tried to do that before.  Please check with the referral coordinator about the status

## 2015-12-21 NOTE — Telephone Encounter (Signed)
PT requests a referral to Delrae Alfred (Physical Therapy) in [facility], [phone]

## 2015-12-22 NOTE — Telephone Encounter (Signed)
I called Connor [name], [facility] [phone] patient  has been seen by   Delena Bali - 12-21-2015 guy conway is no longer at practice

## 2015-12-22 NOTE — Telephone Encounter (Signed)
I'm not sure what the patient is asking for

## 2015-12-22 NOTE — Telephone Encounter (Signed)
To be advised.  

## 2016-01-17 ENCOUNTER — Telehealth: Payer: Self-pay | Admitting: Endocrinology

## 2016-01-17 NOTE — Telephone Encounter (Signed)
Please send a message to the referral Department  to see if they can do this.  I had already tried once before

## 2016-01-17 NOTE — Telephone Encounter (Signed)
Patient called back and said he still wants to see Delrae Alfred, he is at the [facility] next to [facility].    Phone # is [phone].  Can you please put in another referral with the above information?

## 2016-02-09 ENCOUNTER — Encounter: Payer: Self-pay | Admitting: Internal Medicine

## 2016-02-13 ENCOUNTER — Other Ambulatory Visit: Payer: Self-pay | Admitting: *Deleted

## 2016-02-13 DIAGNOSIS — I480 Paroxysmal atrial fibrillation: Secondary | ICD-10-CM

## 2016-02-26 ENCOUNTER — Other Ambulatory Visit: Payer: Medicare Other | Admitting: *Deleted

## 2016-02-26 ENCOUNTER — Ambulatory Visit (HOSPITAL_COMMUNITY): Payer: Medicare Other | Attending: Cardiovascular Disease

## 2016-02-26 ENCOUNTER — Other Ambulatory Visit: Payer: Self-pay

## 2016-02-26 DIAGNOSIS — I519 Heart disease, unspecified: Secondary | ICD-10-CM | POA: Diagnosis present

## 2016-02-26 DIAGNOSIS — G4733 Obstructive sleep apnea (adult) (pediatric): Secondary | ICD-10-CM | POA: Insufficient documentation

## 2016-02-26 DIAGNOSIS — I4510 Unspecified right bundle-branch block: Secondary | ICD-10-CM | POA: Insufficient documentation

## 2016-02-26 DIAGNOSIS — I480 Paroxysmal atrial fibrillation: Secondary | ICD-10-CM | POA: Diagnosis not present

## 2016-02-26 DIAGNOSIS — I071 Rheumatic tricuspid insufficiency: Secondary | ICD-10-CM | POA: Insufficient documentation

## 2016-02-26 DIAGNOSIS — I351 Nonrheumatic aortic (valve) insufficiency: Secondary | ICD-10-CM | POA: Insufficient documentation

## 2016-02-26 DIAGNOSIS — D649 Anemia, unspecified: Secondary | ICD-10-CM | POA: Insufficient documentation

## 2016-02-26 DIAGNOSIS — I340 Nonrheumatic mitral (valve) insufficiency: Secondary | ICD-10-CM | POA: Diagnosis not present

## 2016-02-26 DIAGNOSIS — E785 Hyperlipidemia, unspecified: Secondary | ICD-10-CM | POA: Diagnosis not present

## 2016-02-26 DIAGNOSIS — I371 Nonrheumatic pulmonary valve insufficiency: Secondary | ICD-10-CM | POA: Insufficient documentation

## 2016-02-26 DIAGNOSIS — I10 Essential (primary) hypertension: Secondary | ICD-10-CM | POA: Diagnosis not present

## 2016-02-26 LAB — BASIC METABOLIC PANEL
BUN: 11 mg/dL (ref 7–25)
CHLORIDE: 107 mmol/L (ref 98–110)
CO2: 28 mmol/L (ref 20–31)
Calcium: 8.6 mg/dL (ref 8.6–10.3)
Creat: 0.69 mg/dL — ABNORMAL LOW (ref 0.70–1.18)
Glucose, Bld: 101 mg/dL — ABNORMAL HIGH (ref 65–99)
POTASSIUM: 4.2 mmol/L (ref 3.5–5.3)
Sodium: 143 mmol/L (ref 135–146)

## 2016-02-26 LAB — MAGNESIUM: Magnesium: 2.4 mg/dL (ref 1.5–2.5)

## 2016-02-29 ENCOUNTER — Encounter: Payer: Self-pay | Admitting: Internal Medicine

## 2016-02-29 ENCOUNTER — Ambulatory Visit (INDEPENDENT_AMBULATORY_CARE_PROVIDER_SITE_OTHER): Payer: Medicare Other | Admitting: Internal Medicine

## 2016-02-29 VITALS — BP 112/68 | HR 72 | Ht 72.0 in | Wt 233.2 lb

## 2016-02-29 DIAGNOSIS — G4733 Obstructive sleep apnea (adult) (pediatric): Secondary | ICD-10-CM

## 2016-02-29 DIAGNOSIS — I519 Heart disease, unspecified: Secondary | ICD-10-CM

## 2016-02-29 DIAGNOSIS — I480 Paroxysmal atrial fibrillation: Secondary | ICD-10-CM

## 2016-02-29 NOTE — Patient Instructions (Signed)
Medication Instructions:  Your physician recommends that you continue on your current medications as directed. Please refer to the Current Medication list given to you today.   Labwork: None ordered   Testing/Procedures: None ordered   Follow-Up:  Your physician recommends that you schedule a follow-up appointment in: 3 months with Donna Carroll, NP and 6 months with Dr Allred   Any Other Special Instructions Will Be Listed Below (If Applicable).     If you need a refill on your cardiac medications before your next appointment, please call your pharmacy.   

## 2016-02-29 NOTE — Progress Notes (Signed)
Electrophysiology Office Note   Date:  02/29/2016   ID:  Connor [name], DOB 06-25-1933, MRN [id number]  PCP:  Elayne Snare, MD  Cardiologist:   (previously Mare Ferrari) Primary Electrophysiologist:  [name], MD    Chief Complaint  Patient presents with  . Atrial Fibrillation     History of Present Illness: Connor [name] is a 82 y.o. male who presents today for electrophysiology follow-up.  Since his last visit, he has done very well.  He has rare palpitations but feels that overall he is maintaining sinus rhythm.  Today, he denies symptoms of chest pain, shortness of breath, orthopnea, PND, lower extremity edema, claudication, dizziness, presyncope, syncope, bleeding, or neurologic sequela. The patient is tolerating medications without difficulties and is otherwise without complaint today.    Past Medical History:  Diagnosis Date  . Abnormal nuclear cardiac imaging test 1/13   Abnormal stress test 05/2011, but cardiac cath 06/2011 showing normal coronary anatomy.  . Anemia   . Atrial flutter [facility])    s/p ablation in March 2012  . BPH (benign prostatic hyperplasia)   . Chronic anticoagulation    on Xarelto for afib  . DVT (deep venous thrombosis) ([place]) 2/12   in setting of R hip surgery, he developed R femoral DVT  . Hyperlipidemia   . Hypertension   . Obstructive sleep apnea    compliant with CPAP.  managed by Dr Elsworth Soho.  . Osteoarthritis   . Paroxysmal atrial fibrillation ([place]) 9/12   a. documented by Dr Barrett Shell on office EKG 9/12. b. maintained on Tikosyn and Xarelto. c. s/p DCCV 10/2012.  Marland Kitchen Peptic ulcer disease   . Prediabetes   . Premature ventricular contraction   . RBBB (right bundle branch block)   . Vitamin D deficiency    Past Surgical History:  Procedure Laterality Date  . ABLATION OF DYSRHYTHMIC FOCUS  08/29/2015  . atrial flutter ablation  5/12   by JA  . CARDIAC CATHETERIZATION    . CARDIOVERSION N/A 10/28/2012   Procedure: CARDIOVERSION;  Surgeon:  Burnell Blanks, MD;  Location: [place];  Service: Cardiovascular;  Laterality: N/A;  . ELECTROPHYSIOLOGIC STUDY N/A 08/29/2015   Procedure: Atrial Fibrillation Ablation;  Surgeon: [name], MD;  Location: [place] CV LAB;  Service: Cardiovascular;  Laterality: N/A;  . LEFT HEART CATHETERIZATION WITH CORONARY ANGIOGRAM N/A 06/07/2011   Procedure: LEFT HEART CATHETERIZATION WITH CORONARY ANGIOGRAM;  Surgeon: Peter M Martinique, MD;  Location: [facility] CATH LAB;  Service: Cardiovascular;  Laterality: N/A;  . TOTAL HIP ARTHROPLASTY  06/04/10   RIGHT HIP  . TOTAL KNEE ARTHROPLASTY    . TRANSURETHRAL RESECTION OF PROSTATE       Current Outpatient Prescriptions  Medication Sig Dispense Refill  . acetaminophen (TYLENOL) 500 MG tablet Take 1,000 mg by mouth 2 (two) times daily as needed for moderate pain.    Marland Kitchen atorvastatin (LIPITOR) 10 MG tablet TAKE 1 TABLET BY MOUTH EVERY DAY 90 tablet 1  . Cholecalciferol (VITAMIN D3) 5000 UNITS TABS Take 5,000 Units by mouth as directed. Pt takes 1 tablet by mouth every 4 days    . clindamycin (CLEOCIN T) 1 % external solution Apply 1 application topically daily as needed (for bumps in scalp). Apply to scalp daily as needed  2  . dofetilide (TIKOSYN) 500 MCG capsule TAKE 1 CAPSULE BY MOUTH EVERY 12 HOURS 180 capsule 3  . methocarbamol (ROBAXIN) 500 MG tablet Take 1 tablet (500 mg total) by mouth at bedtime as needed (  pain). 30 tablet 3  . metoprolol succinate (TOPROL-XL) 50 MG 24 hr tablet Take 50 mg by mouth daily. Take with or immediately following a meal.    . metoprolol tartrate (LOPRESSOR) 25 MG tablet TAKE 1 TABLET BY MOUTH EVERY 6 HOURS AS NEEDED FOR FAST HEART RATES    . Multiple Vitamin (MULITIVITAMIN) LIQD Take 5 mLs by mouth daily.    . Multiple Vitamins-Minerals (ICAPS PO) Take 1 tablet by mouth daily.     . Omega-3 Fatty Acids (OMEGA 3 PO) Take 3 capsules by mouth daily.     Marland Kitchen omeprazole (PRILOSEC) 20 MG capsule Take 20 mg by mouth daily.     Marland Kitchen OVER  THE COUNTER MEDICATION Place 3 drops into both eyes daily as needed (dry eyes). "thera tears"    . potassium chloride SA (KLOR-CON M20) 20 MEQ tablet TAKE 2 TABLETS (40MEQ) BY MOUTH IN THE MORNING AND 2 TABLETS (40MEQ) IN THE EVENING. 360 tablet 2  . tamsulosin (FLOMAX) 0.4 MG CAPS capsule TAKE ONE CAPSULE BY MOUTH ONCE DAILY 30 MINUTES AFTER THE SAME MEAL 90 capsule 1  . traMADol (ULTRAM) 50 MG tablet Take 1 tablet (50 mg total) by mouth every 6 (six) hours as needed (pain). 60 tablet 5  . vitamin B-12 (CYANOCOBALAMIN) 500 MCG tablet Take 500 mcg by mouth daily.    Alveda Reasons 20 MG TABS tablet TAKE 1 TABLET BY MOUTH EVERY DAY 90 tablet 3   No current facility-administered medications for this visit.     Allergies:   Penicillins   Social History:  The patient  reports that he quit smoking about 37 years ago. His smoking use included Cigarettes. He has a 30.00 pack-year smoking history. He has never used smokeless tobacco. He reports that he drinks about 3.0 oz of alcohol per week . He reports that he does not use drugs.   Family History:  The patient's family history includes Arrhythmia in his sister; Atrial fibrillation in his brother; Heart attack in his father; Lung disease in his mother.    ROS:  Please see the history of present illness.   All other systems are reviewed and negative.    PHYSICAL EXAM: VS:  BP 112/68   Pulse 72   Ht 6' (1.829 m)   Wt 233 lb 3.2 oz (105.8 kg)   BMI 31.63 kg/m  , BMI Body mass index is 31.63 kg/m. GEN: Well nourished, well developed, in no acute distress  HEENT: normal  Neck: no JVD, carotid bruits, or masses Cardiac: RRR; no murmurs, rubs, or gallops,no edema  Respiratory:  clear to auscultation bilaterally, normal work of breathing GI: soft, nontender, nondistended, + BS MS: no deformity or atrophy  Skin: warm and dry  Neuro:  Strength and sensation are intact Psych: euthymic mood, full affect  EKG:  EKG is ordered today. The ekg ordered  today shows sinus rhythm 72 bpm, PR 162 msec, with RBBB, Qtc 510 (stable)   Recent Labs: 09/20/2015: ALT 17; Hemoglobin 14.2; Platelets 115.0; TSH 1.37 02/26/2016: BUN 11; Creat 0.69; Magnesium 2.4; Potassium 4.2; Sodium 143    Lipid Panel     Component Value Date/Time   CHOL 155 09/20/2015 0908   TRIG 61.0 09/20/2015 0908   HDL 46.60 09/20/2015 0908   CHOLHDL 3 09/20/2015 0908   VLDL 12.2 09/20/2015 0908   LDLCALC 96 09/20/2015 0908     Wt Readings from Last 3 Encounters:  02/29/16 233 lb 3.2 oz (105.8 kg)  11/29/15 220 lb  9.6 oz (100.1 kg)  09/27/15 222 lb 12.8 oz (101.1 kg)     ASSESSMENT AND PLAN:  1.  Paroxysmal atrial fibrillation Doing very well s/p ablation maintaining sinus rhythm We tried to reduce metoprolol previously but he increased it back due to his BP.  Feels about the same No other changes today chad2vasc is at least 4.  He has also had prior DVT.  Would continue life long anticoagulation Continue tikosyn for now per patients request.  2. OSA Compliance with CPAP is advised  3. HTN Stable No change required today  4. Nonischemic CM Repeat Echo reveals EF 45-50% No changes at this time. Consider addition of losartan on follow-up which may also help with atrial remodeling.  Current medicines are reviewed at length with the patient today.   The patient does not have concerns regarding his medicines.  The following changes were made today:  none   Signed, [name], MD  02/29/2016 10:48 AM     [facility] [address] [place] [address] [phone] (office) [phone] (fax)

## 2016-03-19 ENCOUNTER — Encounter: Payer: Self-pay | Admitting: Pulmonary Disease

## 2016-03-20 ENCOUNTER — Encounter: Payer: Self-pay | Admitting: Pulmonary Disease

## 2016-03-20 ENCOUNTER — Ambulatory Visit (INDEPENDENT_AMBULATORY_CARE_PROVIDER_SITE_OTHER): Payer: Medicare Other | Admitting: Pulmonary Disease

## 2016-03-20 DIAGNOSIS — I4891 Unspecified atrial fibrillation: Secondary | ICD-10-CM

## 2016-03-20 DIAGNOSIS — G4733 Obstructive sleep apnea (adult) (pediatric): Secondary | ICD-10-CM

## 2016-03-20 NOTE — Patient Instructions (Signed)
CPAP supplies will be renewed x 1 year 

## 2016-03-20 NOTE — Progress Notes (Signed)
   Subjective:    Patient ID: Connor [name], male    DOB: Feb 12, 1934, 82 y.o.   MRN: [id number]  HPI  82 year old with recurrent atrial fibrillation presents for FU of obstructive sleep apnea.  He had ablation for atrial flutter  in June 2014 And again for atrial fibrillation and February 2017 He had a DVT after right hip replacement in feb 2012 with resolution on duplex in 01/2012.  He reports nasal injury as a child and chronic nasal blockage    03/20/2016  Chief Complaint  Patient presents with  . Follow-up    pt wears cpap avg 4-6hr nightly, feels pressure & mask are okay. pt waking feeling well rested. [id number]   He is maintained on CPAP 11 cm with a full face mask He has settled down with this and does not report any problems with mask and pressure. No problems with nasal congestion or dryness. He has invested in a  so clean machine and likes it Tried nasal mask & pillows in the past but did not persist, back to FF mask  He has good and bad nights with the machine CPAP download confirms average compliance more than 4 hours per night with no residual events and mild leak     Significant tests/ events  PSG 05/2013 Moderate OSA with AHI 21/h 07/2013 CPAP of 11 centimeters with a large full face mask Download -residual AHI 5/h on 11 cm, no leak, good usage 6h    Review of Systems neg for any significant sore throat, dysphagia, itching, sneezing, nasal congestion or excess/ purulent secretions, fever, chills, sweats, unintended wt loss, pleuritic or exertional cp, hempoptysis, orthopnea pnd or change in chronic leg swelling. Also denies presyncope, palpitations, heartburn, abdominal pain, nausea, vomiting, diarrhea or change in bowel or urinary habits, dysuria,hematuria, rash, arthralgias, visual complaints, headache, numbness weakness or ataxia.     Objective:   Physical Exam   Gen. Pleasant, well-nourished, in no distress ENT - no lesions, no post nasal  drip Neck: No JVD, no thyromegaly, no carotid bruits Lungs: no use of accessory muscles, no dullness to percussion, clear without rales or rhonchi  Cardiovascular: Rhythm regular, heart sounds  normal, no murmurs or gallops, no peripheral edema Musculoskeletal: No deformities, no cyanosis or clubbing         Assessment & Plan:

## 2016-03-20 NOTE — Assessment & Plan Note (Signed)
Benefits of CPAP were discussed

## 2016-03-20 NOTE — Addendum Note (Signed)
Addended by: Maryanna Shape A on: 03/20/2016 12:09 PM   Modules accepted: Orders

## 2016-03-20 NOTE — Assessment & Plan Note (Signed)
CPAP supplies will be renewed for a year He does have reasonable compliance and has benefited from the CPAP machine  compliance with goal of at least 4-6 hrs every night is the expectation. Advised against medications with sedative side effects Cautioned against driving when sleepy - understanding that sleepiness will vary on a day to day basis

## 2016-04-07 ENCOUNTER — Other Ambulatory Visit: Payer: Self-pay | Admitting: Endocrinology

## 2016-04-20 ENCOUNTER — Encounter (HOSPITAL_COMMUNITY): Payer: Self-pay | Admitting: Emergency Medicine

## 2016-04-20 ENCOUNTER — Observation Stay (HOSPITAL_COMMUNITY): Payer: Medicare Other

## 2016-04-20 ENCOUNTER — Observation Stay (HOSPITAL_COMMUNITY)
Admission: EM | Admit: 2016-04-20 | Discharge: 2016-04-22 | Disposition: A | Discharge status: Home / Self Care | Payer: Medicare Other | Attending: Internal Medicine | Admitting: Internal Medicine

## 2016-04-20 DIAGNOSIS — Z880 Allergy status to penicillin: Secondary | ICD-10-CM | POA: Diagnosis not present

## 2016-04-20 DIAGNOSIS — I428 Other cardiomyopathies: Secondary | ICD-10-CM | POA: Diagnosis not present

## 2016-04-20 DIAGNOSIS — Z8249 Family history of ischemic heart disease and other diseases of the circulatory system: Secondary | ICD-10-CM | POA: Insufficient documentation

## 2016-04-20 DIAGNOSIS — I10 Essential (primary) hypertension: Secondary | ICD-10-CM | POA: Diagnosis present

## 2016-04-20 DIAGNOSIS — Z96652 Presence of left artificial knee joint: Secondary | ICD-10-CM | POA: Insufficient documentation

## 2016-04-20 DIAGNOSIS — S76312A Strain of muscle, fascia and tendon of the posterior muscle group at thigh level, left thigh, initial encounter: Secondary | ICD-10-CM | POA: Insufficient documentation

## 2016-04-20 DIAGNOSIS — I70202 Unspecified atherosclerosis of native arteries of extremities, left leg: Secondary | ICD-10-CM | POA: Insufficient documentation

## 2016-04-20 DIAGNOSIS — Z96641 Presence of right artificial hip joint: Secondary | ICD-10-CM | POA: Insufficient documentation

## 2016-04-20 DIAGNOSIS — I472 Ventricular tachycardia: Secondary | ICD-10-CM | POA: Insufficient documentation

## 2016-04-20 DIAGNOSIS — I4510 Unspecified right bundle-branch block: Secondary | ICD-10-CM | POA: Diagnosis not present

## 2016-04-20 DIAGNOSIS — R7303 Prediabetes: Secondary | ICD-10-CM | POA: Diagnosis not present

## 2016-04-20 DIAGNOSIS — W000XXA Fall on same level due to ice and snow, initial encounter: Secondary | ICD-10-CM | POA: Diagnosis not present

## 2016-04-20 DIAGNOSIS — Z8711 Personal history of peptic ulcer disease: Secondary | ICD-10-CM | POA: Insufficient documentation

## 2016-04-20 DIAGNOSIS — G4733 Obstructive sleep apnea (adult) (pediatric): Secondary | ICD-10-CM | POA: Insufficient documentation

## 2016-04-20 DIAGNOSIS — I480 Paroxysmal atrial fibrillation: Secondary | ICD-10-CM | POA: Diagnosis not present

## 2016-04-20 DIAGNOSIS — S8012XA Contusion of left lower leg, initial encounter: Secondary | ICD-10-CM

## 2016-04-20 DIAGNOSIS — E559 Vitamin D deficiency, unspecified: Secondary | ICD-10-CM | POA: Diagnosis not present

## 2016-04-20 DIAGNOSIS — M1990 Unspecified osteoarthritis, unspecified site: Secondary | ICD-10-CM | POA: Diagnosis not present

## 2016-04-20 DIAGNOSIS — I4891 Unspecified atrial fibrillation: Secondary | ICD-10-CM | POA: Diagnosis not present

## 2016-04-20 DIAGNOSIS — N400 Enlarged prostate without lower urinary tract symptoms: Secondary | ICD-10-CM | POA: Diagnosis not present

## 2016-04-20 DIAGNOSIS — E042 Nontoxic multinodular goiter: Secondary | ICD-10-CM | POA: Diagnosis not present

## 2016-04-20 DIAGNOSIS — E785 Hyperlipidemia, unspecified: Secondary | ICD-10-CM | POA: Diagnosis not present

## 2016-04-20 DIAGNOSIS — Z87891 Personal history of nicotine dependence: Secondary | ICD-10-CM | POA: Insufficient documentation

## 2016-04-20 DIAGNOSIS — R58 Hemorrhage, not elsewhere classified: Secondary | ICD-10-CM | POA: Insufficient documentation

## 2016-04-20 DIAGNOSIS — Z86718 Personal history of other venous thrombosis and embolism: Secondary | ICD-10-CM | POA: Diagnosis not present

## 2016-04-20 DIAGNOSIS — M47812 Spondylosis without myelopathy or radiculopathy, cervical region: Secondary | ICD-10-CM | POA: Diagnosis not present

## 2016-04-20 DIAGNOSIS — Z7901 Long term (current) use of anticoagulants: Secondary | ICD-10-CM | POA: Insufficient documentation

## 2016-04-20 DIAGNOSIS — R55 Syncope and collapse: Secondary | ICD-10-CM | POA: Diagnosis not present

## 2016-04-20 DIAGNOSIS — K219 Gastro-esophageal reflux disease without esophagitis: Secondary | ICD-10-CM | POA: Insufficient documentation

## 2016-04-20 DIAGNOSIS — S7012XA Contusion of left thigh, initial encounter: Secondary | ICD-10-CM | POA: Diagnosis not present

## 2016-04-20 DIAGNOSIS — T148XXA Other injury of unspecified body region, initial encounter: Secondary | ICD-10-CM | POA: Diagnosis present

## 2016-04-20 LAB — TROPONIN I
TROPONIN I: 0.04 ng/mL — AB (ref <0.03)
TROPONIN I: 0.04 ng/mL — AB (ref <0.03)
Troponin I: 0.04 ng/mL (ref <0.03)
Troponin I: 0.04 ng/mL (ref <0.03)

## 2016-04-20 LAB — BASIC METABOLIC PANEL
ANION GAP: 4 — AB (ref 5–15)
BUN: 13 mg/dL (ref 6–20)
CO2: 29 mmol/L (ref 22–32)
Calcium: 8.5 mg/dL — ABNORMAL LOW (ref 8.9–10.3)
Chloride: 108 mmol/L (ref 101–111)
Creatinine, Ser: 0.76 mg/dL (ref 0.61–1.24)
GFR calc Af Amer: >60 mL/min (ref >60)
GFR calc non Af Amer: >60 mL/min (ref >60)
GLUCOSE: 121 mg/dL — AB (ref 65–99)
POTASSIUM: 4.3 mmol/L (ref 3.5–5.1)
Sodium: 141 mmol/L (ref 135–145)

## 2016-04-20 LAB — URINALYSIS, ROUTINE W REFLEX MICROSCOPIC
Bilirubin Urine: NEGATIVE
GLUCOSE, UA: NEGATIVE mg/dL
Hgb urine dipstick: NEGATIVE
KETONES UR: NEGATIVE mg/dL
NITRITE: NEGATIVE
PROTEIN: NEGATIVE mg/dL
SQUAMOUS EPITHELIAL / LPF: NONE SEEN
Specific Gravity, Urine: 1.016 (ref 1.005–1.030)
pH: 5 (ref 5.0–8.0)

## 2016-04-20 LAB — CBC
HCT: 40.9 % (ref 39.0–52.0)
HEMOGLOBIN: 13.4 g/dL (ref 13.0–17.0)
MCH: 33.3 pg (ref 26.0–34.0)
MCHC: 32.8 g/dL (ref 30.0–36.0)
MCV: 101.5 fL — AB (ref 78.0–100.0)
Platelets: 141 10*3/uL — ABNORMAL LOW (ref 150–400)
RBC: 4.03 MIL/uL — ABNORMAL LOW (ref 4.22–5.81)
RDW: 13.4 % (ref 11.5–15.5)
WBC: 12.6 10*3/uL — ABNORMAL HIGH (ref 4.0–10.5)

## 2016-04-20 LAB — POC OCCULT BLOOD, ED: FECAL OCCULT BLD: NEGATIVE

## 2016-04-20 LAB — HEMOGLOBIN AND HEMATOCRIT, BLOOD
HCT: 37.4 % — ABNORMAL LOW (ref 39.0–52.0)
HCT: 33.9 % — ABNORMAL LOW (ref 39.0–52.0)
HEMOGLOBIN: 11.3 g/dL — AB (ref 13.0–17.0)
Hemoglobin: 12.4 g/dL — ABNORMAL LOW (ref 13.0–17.0)

## 2016-04-20 LAB — I-STAT CG4 LACTIC ACID, ED: Lactic Acid, Venous: 0.84 mmol/L (ref 0.5–1.9)

## 2016-04-20 LAB — CK: Total CK: 95 U/L (ref 49–397)

## 2016-04-20 MED ORDER — METOPROLOL SUCCINATE ER 50 MG PO TB24
50.0000 mg | ORAL_TABLET | Freq: Every day | ORAL | Status: DC
  Administered 2016-04-21 – 2016-04-22 (×2): 50 mg via ORAL
  Filled 2016-04-20 (×2): qty 1

## 2016-04-20 MED ORDER — DOFETILIDE 500 MCG PO CAPS
500.0000 ug | ORAL_CAPSULE | Freq: Two times a day (BID) | ORAL | Status: DC
  Administered 2016-04-20 – 2016-04-22 (×4): 500 ug via ORAL
  Filled 2016-04-20 (×4): qty 1

## 2016-04-20 MED ORDER — SODIUM CHLORIDE 0.9% FLUSH
3.0000 mL | Freq: Two times a day (BID) | INTRAVENOUS | Status: DC
  Administered 2016-04-20 – 2016-04-22 (×4): 3 mL via INTRAVENOUS

## 2016-04-20 MED ORDER — TRAMADOL HCL 50 MG PO TABS: 50.0000 mg | ORAL_TABLET | Freq: Four times a day (QID) | ORAL | Status: DC | PRN

## 2016-04-20 MED ORDER — ATORVASTATIN CALCIUM 10 MG PO TABS
10.0000 mg | ORAL_TABLET | Freq: Every day | ORAL | Status: DC
  Administered 2016-04-21 – 2016-04-22 (×2): 10 mg via ORAL
  Filled 2016-04-20 (×2): qty 1

## 2016-04-20 MED ORDER — MORPHINE SULFATE (PF) 2 MG/ML IV SOLN
2.0000 mg | INTRAVENOUS | Status: DC | PRN
  Administered 2016-04-20: 2 mg via INTRAVENOUS
  Filled 2016-04-20: qty 1

## 2016-04-20 MED ORDER — POLYETHYLENE GLYCOL 3350 17 G PO PACK: 17.0000 g | PACK | Freq: Every day | ORAL | Status: DC | PRN

## 2016-04-20 MED ORDER — SODIUM CHLORIDE 0.9 % IV BOLUS (SEPSIS)
1000.0000 mL | Freq: Once | INTRAVENOUS | Status: AC
  Administered 2016-04-20: 1000 mL via INTRAVENOUS

## 2016-04-20 MED ORDER — ACETAMINOPHEN 500 MG PO TABS: 1000.0000 mg | ORAL_TABLET | Freq: Two times a day (BID) | ORAL | Status: DC | PRN

## 2016-04-20 MED ORDER — PANTOPRAZOLE SODIUM 40 MG PO TBEC
40.0000 mg | DELAYED_RELEASE_TABLET | Freq: Every day | ORAL | Status: DC
  Administered 2016-04-21 – 2016-04-22 (×2): 40 mg via ORAL
  Filled 2016-04-20 (×2): qty 1

## 2016-04-20 MED ORDER — POTASSIUM CHLORIDE CRYS ER 20 MEQ PO TBCR
40.0000 meq | EXTENDED_RELEASE_TABLET | Freq: Every day | ORAL | Status: DC
  Administered 2016-04-21 – 2016-04-22 (×2): 40 meq via ORAL
  Filled 2016-04-20 (×2): qty 2

## 2016-04-20 MED ORDER — SODIUM CHLORIDE 0.9 % IV SOLN
INTRAVENOUS | Status: AC
  Administered 2016-04-20: 18:00:00 via INTRAVENOUS

## 2016-04-20 MED ORDER — MORPHINE SULFATE (PF) 4 MG/ML IV SOLN
2.0000 mg | Freq: Once | INTRAVENOUS | Status: AC
  Administered 2016-04-20: 2 mg via INTRAVENOUS
  Filled 2016-04-20: qty 1

## 2016-04-20 MED ORDER — TRAMADOL HCL 50 MG PO TABS
50.0000 mg | ORAL_TABLET | Freq: Once | ORAL | Status: AC
Start: 2016-04-20 — End: 2016-04-20
  Administered 2016-04-20: 50 mg via ORAL
  Filled 2016-04-20: qty 1

## 2016-04-20 MED ORDER — TAMSULOSIN HCL 0.4 MG PO CAPS
0.4000 mg | ORAL_CAPSULE | Freq: Every day | ORAL | Status: DC
  Administered 2016-04-21 – 2016-04-22 (×2): 0.4 mg via ORAL
  Filled 2016-04-20 (×2): qty 1

## 2016-04-20 NOTE — H&P (Signed)
History and Physical    Connor [name] [id number] DOB: [birth year] DOA: 04/20/2016  PCP: Elayne Snare, MD   Patient coming from: Home  Chief Complaint: near syncope with increased pain of the left thigh  HPI: Connor [name] is a 82 y.o. male with medical history significant of with previous history of paroxysmal Afib/ Aflutter, status two ablations, on anticoagulation therapy with Xarelto, hypertension, hyperlipidemia, remote DVT(2012),  false-positive stress test was normal coronaries in 2013, OSA on CPAP who presented to the emergency department with complaints of her dizziness and near fainting episode at home as well as increased pain and swelling of the left thigh. Patient reported that approximately a week ago he came into the house after walking on the snow and and fell striking his left hamstring. He had been seen by Dr. Noemi Chapel, recommended observe the site of injury carefully and start taking antibiotics if developed infection. He noticed that swelling and papin were improving over the apst few days, however, this morning patient woke with severe cramping in the leg. He attempted to get up from the bed and became extremely dizzy, sweaty, weak and felt near passing out  ED Course: In the emergency department patient had hypotension and was unable to tolerate orthostatic blood pressure checks due to fainting. After resting a while his orthostatic BP was significantly dropping while;e standing  - 83/62 mmHg and patient was very symptomatic. CBC reveal any hemoglobin drip but showed elevated white blood cell count - 12,600 Patient was given a bolus of 2 L normal saline and the most recent blood pressure improved to 113/80 mmHg UA showed trace of leukocytes and rare bacteria and the specimen was submitted for culture, equivocal blood was negative, lactic acid was normal at 0.84  Review of Systems: As per HPI otherwise 10 point review of systems negative.    Past Medical History:    Diagnosis Date  . Abnormal nuclear cardiac imaging test 1/13   Abnormal stress test 05/2011, but cardiac cath 06/2011 showing normal coronary anatomy.  . Anemia   . Atrial flutter [facility])    s/p ablation in March 2012  . BPH (benign prostatic hyperplasia)   . Chronic anticoagulation    on Xarelto for afib  . DVT (deep venous thrombosis) ([place]) 2/12   in setting of R hip surgery, he developed R femoral DVT  . Hyperlipidemia   . Hypertension   . Obstructive sleep apnea    compliant with CPAP.  managed by Dr Elsworth Soho.  . Osteoarthritis   . Paroxysmal atrial fibrillation ([place]) 9/12   a. documented by Dr Barrett Shell on office EKG 9/12. b. maintained on Tikosyn and Xarelto. c. s/p DCCV 10/2012.  Marland Kitchen Peptic ulcer disease   . Prediabetes   . Premature ventricular contraction   . RBBB (right bundle branch block)   . Vitamin D deficiency     Past Surgical History:  Procedure Laterality Date  . ABLATION OF DYSRHYTHMIC FOCUS  08/29/2015  . atrial flutter ablation  5/12   by JA  . CARDIAC CATHETERIZATION    . CARDIOVERSION N/A 10/28/2012   Procedure: CARDIOVERSION;  Surgeon: Burnell Blanks, MD;  Location: [place];  Service: Cardiovascular;  Laterality: N/A;  . ELECTROPHYSIOLOGIC STUDY N/A 08/29/2015   Procedure: Atrial Fibrillation Ablation;  Surgeon: [name], MD;  Location: [place] CV LAB;  Service: Cardiovascular;  Laterality: N/A;  . LEFT HEART CATHETERIZATION WITH CORONARY ANGIOGRAM N/A 06/07/2011   Procedure: LEFT HEART CATHETERIZATION WITH CORONARY ANGIOGRAM;  Surgeon: Collier Salina  M Martinique, MD;  Location: [facility] CATH LAB;  Service: Cardiovascular;  Laterality: N/A;  . TOTAL HIP ARTHROPLASTY  06/04/10   RIGHT HIP  . TOTAL KNEE ARTHROPLASTY    . TRANSURETHRAL RESECTION OF PROSTATE       reports that he quit smoking about 37 years ago. His smoking use included Cigarettes. He has a 30.00 pack-year smoking history. He has never used smokeless tobacco. He reports that he drinks about 3.0 oz of alcohol  per week . He reports that he does not use drugs.  Allergies  Allergen Reactions  . Penicillins Itching, Swelling and Other (See Comments)    Has patient had a PCN reaction causing immediate rash, facial/tongue/throat swelling, SOB or lightheadedness with hypotension: Yes Has patient had a PCN reaction causing severe rash involving mucus membranes or skin necrosis: No Has patient had a PCN reaction that required hospitalization No Has patient had a PCN reaction occurring within the last 10 years: No If all of the above answers are "NO", then may proceed with Cephalosporin use.     Family History  Problem Relation Age of Onset  . Heart attack Father   . Lung disease Mother   . Arrhythmia Sister   . Atrial fibrillation Brother   . Diabetes Neg Hx    Unacceptable: Noncontributory, unremarkable, or negative. Acceptable: Family history reviewed and not pertinent (If you reviewed it)  Prior to Admission medications   Medication Sig Start Date End Date Taking? Authorizing Provider  acetaminophen (TYLENOL) 500 MG tablet Take 1,000 mg by mouth 2 (two) times daily as needed for moderate pain.   Yes Historical Provider, MD  atorvastatin (LIPITOR) 10 MG tablet TAKE 1 TABLET BY MOUTH EVERY DAY Patient taking differently: TAKE 10 MG BY MOUTH EVERY DAY 09/25/15  Yes Elayne Snare, MD  Cholecalciferol (VITAMIN D3) 5000 UNITS TABS Take 5,000 Units by mouth See admin instructions. Take 5000 units by mouth every 4 days   Yes Historical Provider, MD  dofetilide (TIKOSYN) 500 MCG capsule TAKE 1 CAPSULE BY MOUTH EVERY 12 HOURS Patient taking differently: Take 500 mcg by mouth every 12 (twelve) hours.  12/20/15  Yes [name], MD  methocarbamol (ROBAXIN) 500 MG tablet Take 1 tablet (500 mg total) by mouth at bedtime as needed (pain). 01/11/15  Yes Elayne Snare, MD  metoprolol succinate (TOPROL-XL) 50 MG 24 hr tablet Take 50 mg by mouth daily. Take with or immediately following a meal.   Yes Historical Provider,  MD  Multiple Vitamin (MULITIVITAMIN) LIQD Take 5 mLs by mouth daily.   Yes Historical Provider, MD  Multiple Vitamins-Minerals (ICAPS PO) Take 1 tablet by mouth every evening.    Yes Historical Provider, MD  Omega-3 Fatty Acids (OMEGA 3 PO) Take 3 capsules by mouth every evening.    Yes Historical Provider, MD  omeprazole (PRILOSEC) 20 MG capsule Take 20 mg by mouth daily.    Yes Darlin Coco, MD  OVER THE COUNTER MEDICATION Place 3 drops into both eyes daily as needed (dry eyes). "thera tears"   Yes Historical Provider, MD  potassium chloride SA (KLOR-CON M20) 20 MEQ tablet TAKE 2 TABLETS (40MEQ) BY MOUTH IN THE MORNING AND 2 TABLETS (40MEQ) IN THE EVENING. 09/12/15  Yes Thayer Headings, MD  tamsulosin (FLOMAX) 0.4 MG CAPS capsule TAKE ONE CAPSULE BY MOUTH ONCE DAILY 30 MINUTES AFTER SAME MEAL Patient taking differently: TAKE 0.4 MG BY MOUTH ONCE DAILY 30 MINUTES AFTER SAME MEAL 04/08/16  Yes Elayne Snare, MD  traMADol (  ULTRAM) 50 MG tablet Take 1 tablet (50 mg total) by mouth every 6 (six) hours as needed (pain). 10/13/15  Yes Elayne Snare, MD  vitamin B-12 (CYANOCOBALAMIN) 500 MCG tablet Take 500 mcg by mouth at bedtime.    Yes Historical Provider, MD  XARELTO 20 MG TABS tablet TAKE 1 TABLET BY MOUTH EVERY DAY Patient taking differently: TAKE 20 MG BY MOUTH EVERY DAY 06/23/15  Yes Darlin Coco, MD  clindamycin (CLEOCIN T) 1 % external solution Apply 1 application topically daily as needed (for bumps in scalp). Apply to scalp daily as needed 06/23/14   Historical Provider, MD    Physical Exam: Vitals:   04/20/16 1500 04/20/16 1530 04/20/16 1600 04/20/16 1630  BP: 100/67 113/84 131/83 113/80  Pulse: 83 80 87 80  Resp: 16 11 14 15   Temp:      TempSrc:      SpO2: 96% 96% 99% 95%      Constitutional: NAD, calm, comfortable Vitals:   04/20/16 1500 04/20/16 1530 04/20/16 1600 04/20/16 1630  BP: 100/67 113/84 131/83 113/80  Pulse: 83 80 87 80  Resp: 16 11 14 15   Temp:      TempSrc:        SpO2: 96% 96% 99% 95%   Eyes: PERRL, lids and conjunctivae normal ENMT: Mucous membranes are moist. Posterior pharynx clear of any exudate or lesions.Normal dentition.  Neck: normal, supple, no masses, no thyromegaly Respiratory: clear to auscultation bilaterally, no wheezing, no crackles. Normal respiratory effort. No accessory muscle use.  Cardiovascular: Regular rate and rhythm, no murmurs / rubs / gallops. No extremity edema. 2+ pedal pulses. No carotid bruits.  Abdomen: no tenderness, no masses palpated. No hepatosplenomegaly. Bowel sounds positive.  Musculoskeletal: no clubbing / cyanosis. No joint deformity upper and lower extremities. Good ROM, no contractures. Left thigh has large posterior hematoma and ecchymosis, area is very tense and tender to touch.  Skin: no rashes, lesions, ulcers. No induration Neurologic: CN 2-12 grossly intact. Sensation intact, DTR normal. Strength 5/5 in all 4.  Psychiatric: Normal judgment and insight. Alert and oriented x 3. Normal mood.    Labs on Admission: I have personally reviewed following labs and imaging studies  CBC:  Recent Labs Lab 04/20/16 1329  WBC 12.6*  HGB 13.4  HCT 40.9  MCV 101.5*  PLT [id number]*   Basic Metabolic Panel:  Recent Labs Lab 04/20/16 1329  NA 141  K 4.3  CL 108  CO2 29  GLUCOSE 121*  BUN 13  CREATININE 0.76  CALCIUM 8.5*    Recent Labs Lab 04/20/16 1329  TROPONINI 0.04*   Urine analysis:    Component Value Date/Time   COLORURINE YELLOW 04/20/2016 [place] 04/20/2016 1423   LABSPEC 1.016 04/20/2016 1423   PHURINE 5.0 04/20/2016 1423   GLUCOSEU NEGATIVE 04/20/2016 [place] 09/20/2015 0908   HGBUR NEGATIVE 04/20/2016 [place] 04/20/2016 1423   BILIRUBINUR Neg 09/20/2014 1127   KETONESUR NEGATIVE 04/20/2016 1423   PROTEINUR NEGATIVE 04/20/2016 1423   UROBILINOGEN 0.2 09/20/2015 0908   NITRITE NEGATIVE 04/20/2016 1423   LEUKOCYTESUR TRACE (A)  04/20/2016 1423     Radiological Exams on Admission: No results found.  EKG: Independently reviewed: sinus rhythm, no acute ischemic changes  Assessment and Plan   Near Syncope Probably associated with large intramuscular hematoma.  Continue telemetry monitoring to exclude arrhythmia as an underlying cause of near syncopal episode  Repeat orthostatic vital signs  Last  echocardiogram in January 2016 showed reduced area to 35-40 % with diffuse hypokinesis  Patientmight benefit from repeat echo if CT is negative for active bleeding  Large left thigh hematoma, increasing in size and pain CT of the left thigh without contrast ordered to rule out bleeding Orthopedic service - Dr. French Ana was contacted and will see patient Hold Xarelto. Monitor HH q 6 hours  PAF - currently Telemetry revealed SR. Continue home meds, monitor, on telemetry  HTN  Since patient is hypotensive currently, will hold home meds and restart when he stabilizes  Hyperlippidemia Continue statin therapy    DVT prophylaxis: none Code Status: full Family Communication: I spoke with wife at bedside  Disposition Plan: Home when stable Consults called: Dr. Noemi Chapel Admission status: observation  York Grice MD [facility] Pager [phone]  If 7PM-7AM, please contact night-coverage [email or web address] Password [facility]  04/20/2016, 4:58 PM

## 2016-04-20 NOTE — ED Triage Notes (Signed)
Per [facility] patient from home for near syncope.  Patient moved from seated to standing position when he experienced at least 15 minutes of severe diaphoresis and weakness, feeling like he was going to pass out. Patient denies LOC.  Patient alert and oriented at this time.  Patient received 324 mg aspirin PTA.  Patient denies chest pain but states he has been having pain is the back of his upper left leg since he woke up. Patient takes xarelto.

## 2016-04-20 NOTE — ED Notes (Signed)
Pt was unable to stand for orthostatic vitals. He complained of getting hot and sweaty and felt like he was "going to faint."

## 2016-04-20 NOTE — Progress Notes (Signed)
Patient arrived in the unit accompanied by two RN and spouse  from ER via stretcher. Orientation to the unit given. Patient verbalizes understanding.

## 2016-04-20 NOTE — ED Provider Notes (Signed)
[place] DEPT Provider Note   CSN: [id number] Arrival date & time: 04/20/16  1253     History   Chief Complaint Chief Complaint  Patient presents with  . Near Syncope    HPI Connor [name] is a 82 y.o. male.  Patient c/o episode feeling faint, lightheaded as if may pass out shortly prior to coming to ED today.  States last weekend, had come inside with wet/snow on shoe, and had stepped forward with left foot and left foot slide forward quickly, did split, pulling his left hamstring muscle. States since then had developed bruising to posterior upper leg. Today, had been sitting/lying down, and went to stand up to have spouse check on the area of bruising. States area also has been persistently sore/painful.  Upon standing, and having pain, felt faint, lightheaded, as if may pass out. Then felt sweaty. Denies associated palpitations or sense of fast or irregular heart beat. Denies any associated or recent cp or discomfort. No sob. No nv.  States symptoms persistent as few minutes, improved upon sitting back down, and now are resolved. Denies fever or chills. States otherwise felt fine earlier today.     Near Syncope  Pertinent negatives include no chest pain, no abdominal pain, no headaches and no shortness of breath.    Past Medical History:  Diagnosis Date  . Abnormal nuclear cardiac imaging test 1/13   Abnormal stress test 05/2011, but cardiac cath 06/2011 showing normal coronary anatomy.  . Anemia   . Atrial flutter [facility])    s/p ablation in March 2012  . BPH (benign prostatic hyperplasia)   . Chronic anticoagulation    on Xarelto for afib  . DVT (deep venous thrombosis) ([place]) 2/12   in setting of R hip surgery, he developed R femoral DVT  . Hyperlipidemia   . Hypertension   . Obstructive sleep apnea    compliant with CPAP.  managed by Dr Elsworth Soho.  . Osteoarthritis   . Paroxysmal atrial fibrillation ([place]) 9/12   a. documented by Dr Barrett Shell on office EKG 9/12. b.  maintained on Tikosyn and Xarelto. c. s/p DCCV 10/2012.  Marland Kitchen Peptic ulcer disease   . Prediabetes   . Premature ventricular contraction   . RBBB (right bundle branch block)   . Vitamin D deficiency     Patient Active Problem List   Diagnosis Date Noted  . RBBB 08/30/2015  . A-fib ([place]) 08/29/2015  . Chronic systolic dysfunction of left ventricle 07/11/2014  . OSA (obstructive sleep apnea) 04/07/2013  . Multinodular goiter 01/20/2013  . Cervical spondylosis without myelopathy 01/18/2013  . Atrial fibrillation with RVR ([place]) 10/27/2012  . PAF (paroxysmal atrial fibrillation) ([place]) 05/28/2012  . Left ventricular dysfunction 12/17/2011  . Low back pain 09/11/2011  . Fatigue 05/22/2011  . PVC (premature ventricular contraction) 01/18/2011  . Persistent atrial fibrillation ([place]) 01/12/2011  . Hyperlipidemia 08/02/2010  . Hypertension 08/02/2010  . Osteoarthritis 08/02/2010  . BPH (benign prostatic hyperplasia) 08/02/2010  . GERD (gastroesophageal reflux disease) 08/02/2010  . h/o Atrial flutter ([place]) s/p ablation 2012     Past Surgical History:  Procedure Laterality Date  . ABLATION OF DYSRHYTHMIC FOCUS  08/29/2015  . atrial flutter ablation  5/12   by JA  . CARDIAC CATHETERIZATION    . CARDIOVERSION N/A 10/28/2012   Procedure: CARDIOVERSION;  Surgeon: Burnell Blanks, MD;  Location: [place];  Service: Cardiovascular;  Laterality: N/A;  . ELECTROPHYSIOLOGIC STUDY N/A 08/29/2015   Procedure: Atrial Fibrillation Ablation;  Surgeon:  [name], MD;  Location: [place] CV LAB;  Service: Cardiovascular;  Laterality: N/A;  . LEFT HEART CATHETERIZATION WITH CORONARY ANGIOGRAM N/A 06/07/2011   Procedure: LEFT HEART CATHETERIZATION WITH CORONARY ANGIOGRAM;  Surgeon: Peter M Martinique, MD;  Location: [facility] CATH LAB;  Service: Cardiovascular;  Laterality: N/A;  . TOTAL HIP ARTHROPLASTY  06/04/10   RIGHT HIP  . TOTAL KNEE ARTHROPLASTY    . TRANSURETHRAL RESECTION OF PROSTATE          Home Medications    Prior to Admission medications   Medication Sig Start Date End Date Taking? Authorizing Provider  acetaminophen (TYLENOL) 500 MG tablet Take 1,000 mg by mouth 2 (two) times daily as needed for moderate pain.   Yes Historical Provider, MD  atorvastatin (LIPITOR) 10 MG tablet TAKE 1 TABLET BY MOUTH EVERY DAY Patient taking differently: TAKE 10 MG BY MOUTH EVERY DAY 09/25/15  Yes Elayne Snare, MD  Cholecalciferol (VITAMIN D3) 5000 UNITS TABS Take 5,000 Units by mouth See admin instructions. Take 5000 units by mouth every 4 days   Yes Historical Provider, MD  dofetilide (TIKOSYN) 500 MCG capsule TAKE 1 CAPSULE BY MOUTH EVERY 12 HOURS Patient taking differently: Take 500 mcg by mouth every 12 (twelve) hours.  12/20/15  Yes [name], MD  methocarbamol (ROBAXIN) 500 MG tablet Take 1 tablet (500 mg total) by mouth at bedtime as needed (pain). 01/11/15  Yes Elayne Snare, MD  metoprolol succinate (TOPROL-XL) 50 MG 24 hr tablet Take 50 mg by mouth daily. Take with or immediately following a meal.   Yes Historical Provider, MD  Multiple Vitamin (MULITIVITAMIN) LIQD Take 5 mLs by mouth daily.   Yes Historical Provider, MD  Multiple Vitamins-Minerals (ICAPS PO) Take 1 tablet by mouth every evening.    Yes Historical Provider, MD  Omega-3 Fatty Acids (OMEGA 3 PO) Take 3 capsules by mouth every evening.    Yes Historical Provider, MD  omeprazole (PRILOSEC) 20 MG capsule Take 20 mg by mouth daily.    Yes Darlin Coco, MD  OVER THE COUNTER MEDICATION Place 3 drops into both eyes daily as needed (dry eyes). "thera tears"   Yes Historical Provider, MD  potassium chloride SA (KLOR-CON M20) 20 MEQ tablet TAKE 2 TABLETS (40MEQ) BY MOUTH IN THE MORNING AND 2 TABLETS (40MEQ) IN THE EVENING. 09/12/15  Yes Thayer Headings, MD  tamsulosin (FLOMAX) 0.4 MG CAPS capsule TAKE ONE CAPSULE BY MOUTH ONCE DAILY 30 MINUTES AFTER SAME MEAL Patient taking differently: TAKE 0.4 MG BY MOUTH ONCE DAILY 30  MINUTES AFTER SAME MEAL 04/08/16  Yes Elayne Snare, MD  traMADol (ULTRAM) 50 MG tablet Take 1 tablet (50 mg total) by mouth every 6 (six) hours as needed (pain). 10/13/15  Yes Elayne Snare, MD  vitamin B-12 (CYANOCOBALAMIN) 500 MCG tablet Take 500 mcg by mouth at bedtime.    Yes Historical Provider, MD  XARELTO 20 MG TABS tablet TAKE 1 TABLET BY MOUTH EVERY DAY Patient taking differently: TAKE 20 MG BY MOUTH EVERY DAY 06/23/15  Yes Darlin Coco, MD  clindamycin (CLEOCIN T) 1 % external solution Apply 1 application topically daily as needed (for bumps in scalp). Apply to scalp daily as needed 06/23/14   Historical Provider, MD    Family History Family History  Problem Relation Age of Onset  . Heart attack Father   . Lung disease Mother   . Arrhythmia Sister   . Atrial fibrillation Brother   . Diabetes Neg Hx  Social History Social History  Substance Use Topics  . Smoking status: Former Smoker    Packs/day: 1.00    Years: 30.00    Types: Cigarettes    Quit date: 05/06/1978  . Smokeless tobacco: Never Used  . Alcohol use 3.0 oz/week    5 Cans of beer per week     Comment: he has just about quit     Allergies   Penicillins   Review of Systems Review of Systems  Constitutional: Negative for chills and fever.  HENT: Negative for sore throat.   Eyes: Negative for visual disturbance.  Respiratory: Negative for shortness of breath.   Cardiovascular: Positive for near-syncope. Negative for chest pain.  Gastrointestinal: Negative for abdominal pain and blood in stool.  Genitourinary: Negative for flank pain.  Musculoskeletal: Negative for back pain.  Skin: Negative for rash.  Neurological: Negative for weakness, numbness and headaches.  Hematological:       On xarelto.   Psychiatric/Behavioral: Negative for confusion.     Physical Exam Updated Vital Signs BP 112/64   Pulse 86   Temp 97.7 F (36.5 C)   Resp 14   SpO2 93%   Physical Exam  Constitutional: He is oriented  to person, place, and time. He appears well-developed and well-nourished. No distress.  HENT:  Head: Atraumatic.  Eyes: Conjunctivae are normal.  Neck: Neck supple. No tracheal deviation present.  Cardiovascular: Normal rate, regular rhythm, normal heart sounds and intact distal pulses.   No murmur heard. Pulmonary/Chest: Effort normal and breath sounds normal. No accessory muscle usage. No respiratory distress.  Abdominal: Soft. He exhibits no distension. There is no tenderness.  Musculoskeletal: He exhibits no edema.  Large area ecchymosis to posterior left thigh. Compartments of left post leg are markedly swollen compared to right however remain soft, not tense. Distal pulses palp. No pain w passive rom at left hip or knee.   Neurological: He is alert and oriented to person, place, and time.  Speech clear/fluent. Motor intact bil. sens grossly intact.   Skin: Skin is warm and dry. No rash noted. He is not diaphoretic.  Psychiatric: He has a normal mood and affect.  Nursing note and vitals reviewed.    ED Treatments / Results  Labs (all labs ordered are listed, but only abnormal results are displayed) Results for orders placed or performed during the hospital encounter of 04/20/16  Troponin I  Result Value Ref Range   Troponin I 0.04 (HH) <0.03 ng/mL  Basic metabolic panel  Result Value Ref Range   Sodium 141 135 - 145 mmol/L   Potassium 4.3 3.5 - 5.1 mmol/L   Chloride 108 101 - 111 mmol/L   CO2 29 22 - 32 mmol/L   Glucose, Bld 121 (H) 65 - 99 mg/dL   BUN 13 6 - 20 mg/dL   Creatinine, Ser 0.76 0.61 - 1.24 mg/dL   Calcium 8.5 (L) 8.9 - 10.3 mg/dL   GFR calc non Af Amer >60 >60 mL/min   GFR calc Af Amer >60 >60 mL/min   Anion gap 4 (L) 5 - 15  CBC  Result Value Ref Range   WBC 12.6 (H) 4.0 - 10.5 K/uL   RBC 4.03 (L) 4.22 - 5.81 MIL/uL   Hemoglobin 13.4 13.0 - 17.0 g/dL   HCT 40.9 39.0 - 52.0 %   MCV 101.5 (H) 78.0 - 100.0 fL   MCH 33.3 26.0 - 34.0 pg   MCHC 32.8 30.0 -  36.0 g/dL  RDW 13.4 11.5 - 15.5 %   Platelets 141 (L) 150 - 400 K/uL  Urinalysis, Routine w reflex microscopic  Result Value Ref Range   Color, Urine YELLOW YELLOW   APPearance CLEAR CLEAR   Specific Gravity, Urine 1.016 1.005 - 1.030   pH 5.0 5.0 - 8.0   Glucose, UA NEGATIVE NEGATIVE mg/dL   Hgb urine dipstick NEGATIVE NEGATIVE   Bilirubin Urine NEGATIVE NEGATIVE   Ketones, ur NEGATIVE NEGATIVE mg/dL   Protein, ur NEGATIVE NEGATIVE mg/dL   Nitrite NEGATIVE NEGATIVE   Leukocytes, UA TRACE (A) NEGATIVE   RBC / HPF 0-5 0 - 5 RBC/hpf   WBC, UA 6-30 0 - 5 WBC/hpf   Bacteria, UA RARE (A) NONE SEEN   Squamous Epithelial / LPF NONE SEEN NONE SEEN   Mucous PRESENT    Hyaline Casts, UA PRESENT     EKG  EKG Interpretation None       Radiology No results found.  Procedures Procedures (including critical care time)  Medications Ordered in ED Medications  traMADol (ULTRAM) tablet 50 mg (50 mg Oral Given 04/20/16 1332)     Initial Impression / Assessment and Plan / ED Course  I have reviewed the triage vital signs and the nursing notes.  Pertinent labs & imaging results that were available during my care of the patient were reviewed by me and considered in my medical decision making (see chart for details).  Clinical Course     Iv ns. Labs.   Reviewed nursing notes and prior charts for additional history.   On recheck, rn indicates bp is now low.  Pt denies new c/o or change from prior.   Wbc is elevated on labs. 6-30 wbc noted on ua.  Will cx ua, add bl cx, check lactate.  Iv ns bolus.   Additional hx from pt - he indicates today he had done some stretching of left hamstring as it was feeling improved - he now notes since stretching earlier that the area has become more swollen and painful that it had been previously.  I suspect with the stretching, probably aggravation of, or opening up of, prior muscle tear with resultant bleeding.    Iv ns boluses.   Given low bp,  near syncope, will admit for obs, serial hgb checks.   Discussed pt with [facility], Dr Janne Napoleon - she will admit.     Final Clinical Impressions(s) / ED Diagnoses   Final diagnoses:  None    New Prescriptions New Prescriptions   No medications on file     Lajean Saver, MD 04/20/16 1529

## 2016-04-21 ENCOUNTER — Encounter: Payer: Self-pay | Admitting: Internal Medicine

## 2016-04-21 DIAGNOSIS — I10 Essential (primary) hypertension: Secondary | ICD-10-CM | POA: Diagnosis not present

## 2016-04-21 DIAGNOSIS — E785 Hyperlipidemia, unspecified: Secondary | ICD-10-CM | POA: Diagnosis not present

## 2016-04-21 DIAGNOSIS — I4891 Unspecified atrial fibrillation: Secondary | ICD-10-CM | POA: Diagnosis not present

## 2016-04-21 DIAGNOSIS — R55 Syncope and collapse: Secondary | ICD-10-CM | POA: Diagnosis not present

## 2016-04-21 LAB — BASIC METABOLIC PANEL
ANION GAP: 8 (ref 5–15)
BUN: 8 mg/dL (ref 6–20)
CALCIUM: 8 mg/dL — AB (ref 8.9–10.3)
CO2: 26 mmol/L (ref 22–32)
CREATININE: 0.7 mg/dL (ref 0.61–1.24)
Chloride: 108 mmol/L (ref 101–111)
GLUCOSE: 101 mg/dL — AB (ref 65–99)
Potassium: 3.6 mmol/L (ref 3.5–5.1)
Sodium: 142 mmol/L (ref 135–145)

## 2016-04-21 LAB — CBC
HCT: 35.5 % — ABNORMAL LOW (ref 39.0–52.0)
Hemoglobin: 11.6 g/dL — ABNORMAL LOW (ref 13.0–17.0)
MCH: 33.4 pg (ref 26.0–34.0)
MCHC: 32.7 g/dL (ref 30.0–36.0)
MCV: 102.3 fL — AB (ref 78.0–100.0)
PLATELETS: 139 10*3/uL — AB (ref 150–400)
RBC: 3.47 MIL/uL — AB (ref 4.22–5.81)
RDW: 13.8 % (ref 11.5–15.5)
WBC: 8.3 10*3/uL (ref 4.0–10.5)

## 2016-04-21 LAB — HEMOGLOBIN AND HEMATOCRIT, BLOOD
HCT: 35.5 % — ABNORMAL LOW (ref 39.0–52.0)
HCT: 34.9 % — ABNORMAL LOW (ref 39.0–52.0)
HCT: 32.2 % — ABNORMAL LOW (ref 39.0–52.0)
Hemoglobin: 11.6 g/dL — ABNORMAL LOW (ref 13.0–17.0)
Hemoglobin: 11.3 g/dL — ABNORMAL LOW (ref 13.0–17.0)
Hemoglobin: 10.9 g/dL — ABNORMAL LOW (ref 13.0–17.0)

## 2016-04-21 LAB — MAGNESIUM: MAGNESIUM: 1.8 mg/dL (ref 1.7–2.4)

## 2016-04-21 LAB — TROPONIN I: Troponin I: 0.04 ng/mL (ref <0.03)

## 2016-04-21 MED ORDER — MAGNESIUM SULFATE 2 GM/50ML IV SOLN
2.0000 g | Freq: Once | INTRAVENOUS | Status: AC
  Administered 2016-04-21: 2 g via INTRAVENOUS
  Filled 2016-04-21: qty 50

## 2016-04-21 MED ORDER — POTASSIUM CHLORIDE CRYS ER 20 MEQ PO TBCR
20.0000 meq | EXTENDED_RELEASE_TABLET | Freq: Once | ORAL | Status: AC
  Administered 2016-04-21: 20 meq via ORAL

## 2016-04-21 NOTE — Progress Notes (Signed)
Patient had a 6 beat run of vtach. MD notified. Order received for Magnesium level and po dose potassium. Patient stable. Vitals WNL. Will continue to monitor patient. Hermina Barters Rn 04/21/2016 6:33 AM

## 2016-04-21 NOTE — Progress Notes (Signed)
Pt. Refused ice to be applied to affected area.   Will continue to monitor.   Lekeshia Kram, RN

## 2016-04-21 NOTE — Progress Notes (Signed)
PROGRESS NOTE    Connor [name]  [id number] DOB: [birth year] DOA: 04/20/2016 PCP: Elayne Snare, MD   Chief Complaint  Patient presents with  . Near Syncope     Brief Narrative:  82 year old male with PAF on Xarelto, HTN, hyperlipidemia,Recent slip on ice last week pulling his left hamstring. Presented with cramping and swollen left leg and hematoma. Orthopedics consulted and appreciated.  Assessment & Plan   Near Syncope -Likely secondary to hematoma -Last echocardiogram 02/26/2016 showed an EF of [id number], grade 1 diastolic dysfunction, no regional wall motion abnormalities, no defect or patent foramen ovale. -Orthostatic vitals positive upon standing, BP dropped to 89/61. -Continue to monitor closely  Large left thigh hematoma -CT of the left thigh: Large posterior intramuscular hematoma measuring 8.5 x 8.4 x 21.2 cm, no acute or healing fracture. -Patient currently neurologically intact -Hematoma/bruising has been outlined -Xarelto held -H&H remaining stable -Orthopedics, Dr. French Ana, consulted and appreciated -PT consulted  Paroxysmal atrial fibrillation -Currently in sinus rhythm -CHADSVASC -Xarelto held. Spoke with Dr. French Ana, orthopedics, recommended holding for approximately 3-4 days.  Essential hypertension -Home medications currently held  Hyperlippidemia -Continue statin  NSVT -Overnight, patient had 6 beat run of NSVT and was asymptomatic. -Potassium was replaced. Magnesium 1.8 -Will replace magnesium and monitor  DVT Prophylaxis  SCDs  Code Status: Full  Family Communication: Wife at bedside  Disposition Plan: In observation. Continue to monitor blood pressure, hematoma, hemoglobin.  Consultants Orthopedic surgery  Procedures  None  Antibiotics   Anti-infectives    None      Subjective:   Connor [name] seen and examined today.  Denies current chest pain, shortness breath, abdominal pain, nausea or vomiting, diarrhea constipation.  Does continue to feel some pain in the left thigh, feels that the swelling in the back of his thigh has actually improved and is "no longer as tense."   Objective:   Vitals:   04/21/16 0918 04/21/16 0920 04/21/16 0923 04/21/16 1201  BP:    107/63  Pulse: 96 97 99 82  Resp:    20  Temp:    98.5 F (36.9 C)  TempSrc:    Oral  SpO2:    96%  Weight:      Height:        Intake/Output Summary (Last 24 hours) at 04/21/16 1207 Last data filed at 04/21/16 0955  Gross per 24 hour  Intake          4501.33 ml  Output             1301 ml  Net          3200.33 ml   Filed Weights   04/20/16 1816 04/21/16 0450  Weight: 103.4 kg (227 lb 15.3 oz) 103.6 kg (228 lb 6.4 oz)    Exam  General: Well developed, well nourished, NAD, appears stated age  HEENT: NCAT, mucous membranes moist.   Cardiovascular: S1 S2 auscultated, no rubs, murmurs or gallops. Regular rate and rhythm.  Respiratory: Clear to auscultation bilaterally with equal chest rise  Abdomen: Soft, nontender, nondistended, + bowel sounds  Extremities: warm dry without cyanosis clubbing. Left thigh- large posterior ecchymosis/hematoma, area is outlined.   Neuro: AAOx3, nonfocal  Psych: Normal affect and demeanor with intact judgement and insight  Data Reviewed: I have personally reviewed following labs and imaging studies  CBC:  Recent Labs Lab 04/20/16 1329 04/20/16 1752 04/20/16 2218 04/21/16 0429 04/21/16 1022  WBC 12.6*  --   --  8.3  --  HGB 13.4 12.4* 11.3* 11.6* 11.6*  HCT 40.9 37.4* 33.9* 35.5* 35.5*  MCV 101.5*  --   --  102.3*  --   PLT 141*  --   --  139*  --    Basic Metabolic Panel:  Recent Labs Lab 04/20/16 1329 04/21/16 0429  NA 141 142  K 4.3 3.6  CL 108 108  CO2 29 26  GLUCOSE 121* 101*  BUN 13 8  CREATININE 0.76 0.70  CALCIUM 8.5* 8.0*  MG  --  1.8   GFR: Estimated Creatinine Clearance: 91.7 mL/min (by C-G formula based on SCr of 0.7 mg/dL). Liver Function Tests: No results for  input(s): AST, ALT, ALKPHOS, BILITOT, PROT, ALBUMIN in the last 168 hours. No results for input(s): LIPASE, AMYLASE in the last 168 hours. No results for input(s): AMMONIA in the last 168 hours. Coagulation Profile: No results for input(s): INR, PROTIME in the last 168 hours. Cardiac Enzymes:  Recent Labs Lab 04/20/16 1329 04/20/16 1553 04/20/16 1835 04/20/16 2218 04/21/16 0429  CKTOTAL  --  95  --   --   --   TROPONINI 0.04* 0.04* 0.04* 0.04* 0.04*   BNP (last 3 results) No results for input(s): PROBNP in the last 8760 hours. HbA1C: No results for input(s): HGBA1C in the last 72 hours. CBG: No results for input(s): GLUCAP in the last 168 hours. Lipid Profile: No results for input(s): CHOL, HDL, LDLCALC, TRIG, CHOLHDL, LDLDIRECT in the last 72 hours. Thyroid Function Tests: No results for input(s): TSH, T4TOTAL, FREET4, T3FREE, THYROIDAB in the last 72 hours. Anemia Panel: No results for input(s): VITAMINB12, FOLATE, FERRITIN, TIBC, IRON, RETICCTPCT in the last 72 hours. Urine analysis:    Component Value Date/Time   COLORURINE YELLOW 04/20/2016 1423   APPEARANCEUR CLEAR 04/20/2016 1423   LABSPEC 1.016 04/20/2016 1423   PHURINE 5.0 04/20/2016 1423   GLUCOSEU NEGATIVE 04/20/2016 1423   GLUCOSEU NEGATIVE 09/20/2015 0908   HGBUR NEGATIVE 04/20/2016 1423   BILIRUBINUR NEGATIVE 04/20/2016 1423   BILIRUBINUR Neg 09/20/2014 1127   KETONESUR NEGATIVE 04/20/2016 1423   PROTEINUR NEGATIVE 04/20/2016 1423   UROBILINOGEN 0.2 09/20/2015 0908   NITRITE NEGATIVE 04/20/2016 1423   LEUKOCYTESUR TRACE (A) 04/20/2016 1423   Sepsis Labs: @LABRCNTIP (procalcitonin:4,lacticidven:4)  ) Recent Results (from the past 240 hour(s))  Urine culture     Status: None (Preliminary result)   Collection Time: 04/20/16  2:23 PM  Result Value Ref Range Status   Specimen Description URINE, RANDOM  Final   Special Requests NONE  Final   Culture CULTURE REINCUBATED FOR BETTER GROWTH  Final   Report  Status PENDING  Incomplete      Radiology Studies: Ct Femur Left Wo Contrast  Result Date: 04/20/2016 CLINICAL DATA:  Fall 2 weeks ago. Hematoma posterior the left femur. Pain. EXAM: CT OF THE LOWER LEFT EXTREMITY WITHOUT CONTRAST TECHNIQUE: Multidetector CT imaging of the lower left extremity was performed according to the standard protocol. COMPARISON:  None. FINDINGS: Bones/Joint/Cartilage No acute fracture is present. Mild degenerative changes present the left hip with some sclerosis along the acetabulum. The femoral head is intact. Left total knee arthroplasty is noted. Ligaments Suboptimally assessed by CT. Muscles and Tendons A large posterior intramuscular hematoma is noted within the left hamstring measuring 8.5 x 8.4 x 21.2 cm. This is significantly posterior to the vasculature without evidence for compartment syndrome. Atherosclerotic calcifications are noted within the left femoral artery. Soft tissues Visualized pelvis is intact. Diverticular present within the sigmoid colon without  evidence for diverticulitis. IMPRESSION: 1. Large posterior intramuscular hematoma measuring 8.5 x 8.4 x 21.2 cm. 2. Atherosclerosis. 3. No acute or healing fracture. 4. Left total knee arthroplasty. 5. Degenerative changes at the left hip. Electronically Signed   By: San Morelle M.D.   On: 04/20/2016 18:35     Scheduled Meds: . atorvastatin  10 mg Oral Daily  . dofetilide  500 mcg Oral Q12H  . metoprolol succinate  50 mg Oral Daily  . pantoprazole  40 mg Oral Daily  . potassium chloride SA  40 mEq Oral Daily  . sodium chloride flush  3 mL Intravenous Q12H  . tamsulosin  0.4 mg Oral Daily   Continuous Infusions:   LOS: 0 days   Time Spent in minutes   30 minutes  Arliene Rosenow D.O. on 04/21/2016 at 12:07 PM  Between 7am to 7pm - Pager - [phone]  After 7pm go to [email or web address] - password [email or web address]  And look for the night coverage person covering for me after hours  [facility] Office  [phone]

## 2016-04-21 NOTE — Consult Note (Signed)
Reason for Consult:left leg hematoma  Referring Physician: Elon Jester, DO Connor [name] is an 82 y.o. male.  HPI: He has seen Dr. Noemi Chapel about a week ago following a slip on the ice and pulled Left hamstring.  He said he was doing ok and swelling was improving then yesterday while sitting on the couch attempted to keep stretching it out.  He had immediate pain increased swelling and felt like he was about to "pass out".  EMS was called and was brought to the ED for evaluation.  CT scan confirmed hematoma Left posterior thigh.  Xarelto was held.    Past Medical History:  Diagnosis Date  . Abnormal nuclear cardiac imaging test 1/13   Abnormal stress test 05/2011, but cardiac cath 06/2011 showing normal coronary anatomy.  . Anemia   . Atrial flutter [facility])    s/p ablation in March 2012  . BPH (benign prostatic hyperplasia)   . Chronic anticoagulation    on Xarelto for afib  . DVT (deep venous thrombosis) ([place]) 2/12   in setting of R hip surgery, he developed R femoral DVT  . Hyperlipidemia   . Hypertension   . Obstructive sleep apnea    compliant with CPAP.  managed by Dr Elsworth Soho.  . Osteoarthritis   . Paroxysmal atrial fibrillation ([place]) 9/12   a. documented by Dr Barrett Shell on office EKG 9/12. b. maintained on Tikosyn and Xarelto. c. s/p DCCV 10/2012.  Marland Kitchen Peptic ulcer disease   . Prediabetes   . Premature ventricular contraction   . RBBB (right bundle branch block)   . Vitamin D deficiency     Past Surgical History:  Procedure Laterality Date  . ABLATION OF DYSRHYTHMIC FOCUS  08/29/2015  . atrial flutter ablation  5/12   by JA  . CARDIAC CATHETERIZATION    . CARDIOVERSION N/A 10/28/2012   Procedure: CARDIOVERSION;  Surgeon: Burnell Blanks, MD;  Location: [facility];  Service: Cardiovascular;  Laterality: N/A;  . ELECTROPHYSIOLOGIC STUDY N/A 08/29/2015   Procedure: Atrial Fibrillation Ablation;  Surgeon: [name], MD;  Location: [place] CV LAB;  Service: Cardiovascular;   Laterality: N/A;  . LEFT HEART CATHETERIZATION WITH CORONARY ANGIOGRAM N/A 06/07/2011   Procedure: LEFT HEART CATHETERIZATION WITH CORONARY ANGIOGRAM;  Surgeon: Peter M Martinique, MD;  Location: [facility] C[facility] LAB;  Service: Cardiovascular;  Laterality: N/A;  . TOTAL HIP ARTHROPLASTY  06/04/10   RIGHT HIP  . TOTAL KNEE ARTHROPLASTY    . TRANSURETHRAL RESECTION OF PROSTATE      Family History  Problem Relation Age of Onset  . Heart attack Father   . Lung disease Mother   . Arrhythmia Sister   . Atrial fibrillation Brother   . Diabetes Neg Hx     Social History:  reports that he quit smoking about 37 years ago. His smoking use included Cigarettes. He has a 30.00 pack-year smoking history. He has never used smokeless tobacco. He reports that he drinks about 3.0 oz of alcohol per week . He reports that he does not use drugs.  Allergies:  Allergies  Allergen Reactions  . Penicillins Itching, Swelling and Other (See Comments)    Has patient had a PCN reaction causing immediate rash, facial/tongue/throat swelling, SOB or lightheadedness with hypotension: Yes Has patient had a PCN reaction causing severe rash involving mucus membranes or skin necrosis: No Has patient had a PCN reaction that required hospitalization No Has patient had a PCN reaction occurring within the last 10 years: No If all  of the above answers are "NO", then may proceed with Cephalosporin use.     Medications: I have reviewed the patient's current medications.  Results for orders placed or performed during the hospital encounter of 04/20/16 (from the past 48 hour(s))  Troponin I     Status: Abnormal   Collection Time: 04/20/16  1:29 PM  Result Value Ref Range   Troponin I 0.04 (HH) <0.03 ng/mL    Comment: CRITICAL RESULT CALLED TO, READ BACK BY AND VERIFIED WITH: REID,K. RN @ 1419 04/20/16 BY EDENS,C.   Basic metabolic panel     Status: Abnormal   Collection Time: 04/20/16  1:29 PM  Result Value Ref Range   Sodium 141 135  - 145 mmol/L   Potassium 4.3 3.5 - 5.1 mmol/L   Chloride 108 101 - 111 mmol/L   CO2 29 22 - 32 mmol/L   Glucose, Bld 121 (H) 65 - 99 mg/dL   BUN 13 6 - 20 mg/dL   Creatinine, Ser 0.76 0.61 - 1.24 mg/dL   Calcium 8.5 (L) 8.9 - 10.3 mg/dL   GFR calc non Af Amer >60 >60 mL/min   GFR calc Af Amer >60 >60 mL/min    Comment: (NOTE) The eGFR has been calculated using the CKD EPI equation. This calculation has not been validated in all clinical situations. eGFR's persistently <60 mL/min signify possible Chronic Kidney Disease.    Anion gap 4 (L) 5 - 15  CBC     Status: Abnormal   Collection Time: 04/20/16  1:29 PM  Result Value Ref Range   WBC 12.6 (H) 4.0 - 10.5 K/uL   RBC 4.03 (L) 4.22 - 5.81 MIL/uL   Hemoglobin 13.4 13.0 - 17.0 g/dL   HCT 40.9 39.0 - 52.0 %   MCV 101.5 (H) 78.0 - 100.0 fL   MCH 33.3 26.0 - 34.0 pg   MCHC 32.8 30.0 - 36.0 g/dL   RDW 13.4 11.5 - 15.5 %   Platelets 141 (L) 150 - 400 K/uL  Urinalysis, Routine w reflex microscopic     Status: Abnormal   Collection Time: 04/20/16  2:23 PM  Result Value Ref Range   Color, Urine YELLOW YELLOW   APPearance CLEAR CLEAR   Specific Gravity, Urine 1.016 1.005 - 1.030   pH 5.0 5.0 - 8.0   Glucose, UA NEGATIVE NEGATIVE mg/dL   Hgb urine dipstick NEGATIVE NEGATIVE   Bilirubin Urine NEGATIVE NEGATIVE   Ketones, ur NEGATIVE NEGATIVE mg/dL   Protein, ur NEGATIVE NEGATIVE mg/dL   Nitrite NEGATIVE NEGATIVE   Leukocytes, UA TRACE (A) NEGATIVE   RBC / HPF 0-5 0 - 5 RBC/hpf   WBC, UA 6-30 0 - 5 WBC/hpf   Bacteria, UA RARE (A) NONE SEEN   Squamous Epithelial / LPF NONE SEEN NONE SEEN   Mucous PRESENT    Hyaline Casts, UA PRESENT   Urine culture     Status: None (Preliminary result)   Collection Time: 04/20/16  2:23 PM  Result Value Ref Range   Specimen Description URINE, RANDOM    Special Requests NONE    Culture CULTURE REINCUBATED FOR BETTER GROWTH    Report Status PENDING   POC occult blood, ED Provider will collect      Status: None   Collection Time: 04/20/16  3:19 PM  Result Value Ref Range   Fecal Occult Bld NEGATIVE NEGATIVE  I-Stat CG4 Lactic Acid, ED     Status: None   Collection Time: 04/20/16  3:53 PM  Result Value Ref Range   Lactic Acid, Venous 0.84 0.5 - 1.9 mmol/L  CK     Status: None   Collection Time: 04/20/16  3:53 PM  Result Value Ref Range   Total CK 95 49 - 397 U/L  Troponin I (q 6hr x 3)     Status: Abnormal   Collection Time: 04/20/16  3:53 PM  Result Value Ref Range   Troponin I 0.04 (HH) <0.03 ng/mL    Comment: CRITICAL VALUE NOTED.  VALUE IS CONSISTENT WITH PREVIOUSLY REPORTED AND CALLED VALUE.  Hemoglobin and hematocrit, blood     Status: Abnormal   Collection Time: 04/20/16  5:52 PM  Result Value Ref Range   Hemoglobin 12.4 (L) 13.0 - 17.0 g/dL   HCT 37.4 (L) 39.0 - 52.0 %  Troponin I     Status: Abnormal   Collection Time: 04/20/16  6:35 PM  Result Value Ref Range   Troponin I 0.04 (HH) <0.03 ng/mL    Comment: CRITICAL VALUE NOTED.  VALUE IS CONSISTENT WITH PREVIOUSLY REPORTED AND CALLED VALUE.  Hemoglobin and hematocrit, blood     Status: Abnormal   Collection Time: 04/20/16 10:18 PM  Result Value Ref Range   Hemoglobin 11.3 (L) 13.0 - 17.0 g/dL   HCT 33.9 (L) 39.0 - 52.0 %  Troponin I (q 6hr x 3)     Status: Abnormal   Collection Time: 04/20/16 10:18 PM  Result Value Ref Range   Troponin I 0.04 (HH) <0.03 ng/mL    Comment: CRITICAL VALUE NOTED.  VALUE IS CONSISTENT WITH PREVIOUSLY REPORTED AND CALLED VALUE.  Troponin I (q 6hr x 3)     Status: Abnormal   Collection Time: 04/21/16  4:29 AM  Result Value Ref Range   Troponin I 0.04 (HH) <0.03 ng/mL    Comment: CRITICAL VALUE NOTED.  VALUE IS CONSISTENT WITH PREVIOUSLY REPORTED AND CALLED VALUE.  CBC     Status: Abnormal   Collection Time: 04/21/16  4:29 AM  Result Value Ref Range   WBC 8.3 4.0 - 10.5 K/uL   RBC 3.47 (L) 4.22 - 5.81 MIL/uL   Hemoglobin 11.6 (L) 13.0 - 17.0 g/dL   HCT 35.5 (L) 39.0 - 52.0 %    MCV 102.3 (H) 78.0 - 100.0 fL   MCH 33.4 26.0 - 34.0 pg   MCHC 32.7 30.0 - 36.0 g/dL   RDW 13.8 11.5 - 15.5 %   Platelets 139 (L) 150 - 400 K/uL  Magnesium     Status: None   Collection Time: 04/21/16  4:29 AM  Result Value Ref Range   Magnesium 1.8 1.7 - 2.4 mg/dL  Basic metabolic panel     Status: Abnormal   Collection Time: 04/21/16  4:29 AM  Result Value Ref Range   Sodium 142 135 - 145 mmol/L   Potassium 3.6 3.5 - 5.1 mmol/L   Chloride 108 101 - 111 mmol/L   CO2 26 22 - 32 mmol/L   Glucose, Bld 101 (H) 65 - 99 mg/dL   BUN 8 6 - 20 mg/dL   Creatinine, Ser 0.70 0.61 - 1.24 mg/dL   Calcium 8.0 (L) 8.9 - 10.3 mg/dL   GFR calc non Af Amer >60 >60 mL/min   GFR calc Af Amer >60 >60 mL/min    Comment: (NOTE) The eGFR has been calculated using the CKD EPI equation. This calculation has not been validated in all clinical situations. eGFR's persistently <60 mL/min signify possible Chronic Kidney Disease.  Anion gap 8 5 - 15  Hemoglobin and hematocrit, blood     Status: Abnormal   Collection Time: 04/21/16 10:22 AM  Result Value Ref Range   Hemoglobin 11.6 (L) 13.0 - 17.0 g/dL   HCT 35.5 (L) 39.0 - 52.0 %    Ct Femur Left Wo Contrast  Result Date: 04/20/2016 CLINICAL DATA:  Fall 2 weeks ago. Hematoma posterior the left femur. Pain. EXAM: CT OF THE LOWER LEFT EXTREMITY WITHOUT CONTRAST TECHNIQUE: Multidetector CT imaging of the lower left extremity was performed according to the standard protocol. COMPARISON:  None. FINDINGS: Bones/Joint/Cartilage No acute fracture is present. Mild degenerative changes present the left hip with some sclerosis along the acetabulum. The femoral head is intact. Left total knee arthroplasty is noted. Ligaments Suboptimally assessed by CT. Muscles and Tendons A large posterior intramuscular hematoma is noted within the left hamstring measuring 8.5 x 8.4 x 21.2 cm. This is significantly posterior to the vasculature without evidence for compartment  syndrome. Atherosclerotic calcifications are noted within the left femoral artery. Soft tissues Visualized pelvis is intact. Diverticular present within the sigmoid colon without evidence for diverticulitis. IMPRESSION: 1. Large posterior intramuscular hematoma measuring 8.5 x 8.4 x 21.2 cm. 2. Atherosclerosis. 3. No acute or healing fracture. 4. Left total knee arthroplasty. 5. Degenerative changes at the left hip. Electronically Signed   By: San Morelle M.D.   On: 04/20/2016 18:35    Review of Systems  Constitutional: Negative for chills and fever.  Respiratory: Negative for shortness of breath.   Cardiovascular: Positive for leg swelling. Negative for chest pain.  Musculoskeletal: Positive for myalgias.  Endo/Heme/Allergies: Bruises/bleeds easily.   Blood pressure 107/63, pulse 82, temperature 98.5 F (36.9 C), temperature source Oral, resp. rate 20, height 6' (1.829 m), weight 103.6 kg (228 lb 6.4 oz), SpO2 96 %. Physical Exam  Constitutional: He is oriented to person, place, and time. He appears well-developed and well-nourished. No distress.  Cardiovascular: Intact distal pulses.   Respiratory: Effort normal. No respiratory distress.  Musculoskeletal:  Compartments soft, left posterior thigh TTP, diffuse ecchymosis,   Neurological: He is alert and oriented to person, place, and time.  Skin: Skin is warm and dry. Bruising and ecchymosis noted.  Psychiatric: He has a normal mood and affect. His behavior is normal.    Assessment/Plan: Hematoma and acute pain/bleeding from hamstring intramuscular tear on xarelto for afib.  Pain is improving today, denies any fevers, chills, sweats, swelling slightly improved today.  Would benefit if able to hold xarelto 3-4 days.  Discussed signs of infection to watch for with hematoma and to call office if concerns, otherwise take it easy and f/u prn.  Chriss Czar 04/21/2016, 12:50 PM

## 2016-04-21 NOTE — Evaluation (Signed)
Physical Therapy Evaluation Patient Details Name: Connor [name] MRN: [id number] DOB: 10-01-1933 Today's Date: 04/21/2016   History of Present Illness  Admitted with near syncope; Mr. Byer had been stretching his L hamstring due to a cramp (had recently pulled it after a fall), causing an intramuscular tear and bleeding leading to a hematoma; Orthostatic on admit; history of a-fib and had been on blood thinners  Clinical Impression   Pt admitted with above diagnosis. Pt currently with functional limitations due to the deficits listed below (see PT Problem List). Overall moving well; Will follow while in  Hospital for activity tolerance and assess for benefit of using a cane;  Orthostatic BPs much improved; Pt will benefit from skilled PT to increase their independence and safety with mobility to allow discharge to the venue listed below.       Follow Up Recommendations Outpatient PT    Equipment Recommendations  Kasandra Knudsen (May not need cane; will monitor for progress)    Recommendations for Other Services       Precautions / Restrictions        Mobility  Bed Mobility Overal bed mobility: Needs Assistance Bed Mobility: Supine to Sit     Supine to sit: Supervision     General bed mobility comments: Cues to self-monitor for activity tolerance  Transfers Overall transfer level: Needs assistance Equipment used: None Transfers: Sit to/from Stand Sit to Stand: Min guard         General transfer comment: [place] for safety; cues to self-monitor for activity tolerance; noted he steadied himself, reaching out for table  Ambulation/Gait Ambulation/Gait assistance: Min guard Ambulation Distance (Feet): 200 Feet Assistive device:  (pushing IV pole) Gait Pattern/deviations: Step-through pattern     General Gait Details: Cues to self-monitor for activity tolerance; Noting some dependence on UE support from IV pole; no reports of dizziness  Stairs            Wheelchair  Mobility    Modified Rankin (Stroke Patients Only)       Balance                                             Pertinent Vitals/Pain Pain Assessment: Faces Faces Pain Scale: Hurts little more Pain Location: L posterior thigh once sitting EOB Pain Descriptors / Indicators: Aching;Discomfort Pain Intervention(s): Monitored during session    Home Living Family/patient expects to be discharged to:: Private residence Living Arrangements: Spouse/significant other Available Help at Discharge: Family;Available PRN/intermittently Type of Home: House Home Access: Stairs to enter Entrance Stairs-Rails: None Entrance Stairs-Number of Steps: 2 Home Layout: One level        Prior Function Level of Independence: Independent               Hand Dominance        Extremity/Trunk Assessment   Upper Extremity Assessment Upper Extremity Assessment: Overall WFL for tasks assessed    Lower Extremity Assessment Lower Extremity Assessment: LLE deficits/detail LLE Deficits / Details: Hip ROM WFL for simple mobility activities; quite ecchymotic post thigh       Communication   Communication: HOH  Cognition Arousal/Alertness: Awake/alert Behavior During Therapy: WFL for tasks assessed/performed Overall Cognitive Status: Within Functional Limits for tasks assessed                      General Comments General comments (  skin integrity, edema, etc.):    04/21/16 1500  Orthostatic Lying   BP- Lying 114/67  Pulse- Lying 85  Orthostatic Sitting  BP- Sitting 130/78  Pulse- Sitting 93  Orthostatic Standing at 0 minutes  BP- Standing at 0 minutes 118/65  Pulse- Standing at 0 minutes 99  Orthostatic Standing at 3 minutes  BP- Standing at 3 minutes 114/78  Pulse- Standing at 3 minutes 105       Exercises     Assessment/Plan    PT Assessment Patient needs continued PT services  PT Problem List Decreased activity tolerance;Decreased  balance;Decreased knowledge of use of DME          PT Treatment Interventions DME instruction;Gait training;Stair training;Functional mobility training;Therapeutic activities;Therapeutic exercise;Balance training;Patient/family education    PT Goals (Current goals can be found in the Care Plan section)  Acute Rehab PT Goals Patient Stated Goal: chasing grandkids PT Goal Formulation: With patient Time For Goal Achievement: 04/28/16 Potential to Achieve Goals: Good    Frequency Min 3X/week   Barriers to discharge        Co-evaluation               End of Session   Activity Tolerance: Patient tolerated treatment well Patient left: in chair;with call bell/phone within reach Nurse Communication: Mobility status    Functional Assessment Tool Used: Clinical Judgement Functional Limitation: Mobility: Walking and moving around Mobility: Walking and Moving Around Current Status [id number]): At least 1 percent but less than 20 percent impaired, limited or restricted Mobility: Walking and Moving Around Goal Status [phone]): 0 percent impaired, limited or restricted    Time: [id number] PT Time Calculation (min) (ACUTE ONLY): 31 min   Charges:   PT Evaluation $PT Eval Low Complexity: 1 Procedure PT Treatments $Gait Training: 8-22 mins   PT G Codes:   PT G-Codes **NOT FOR INPATIENT CLASS** Functional Assessment Tool Used: Clinical Judgement Functional Limitation: Mobility: Walking and moving around Mobility: Walking and Moving Around Current Status [id number]): At least 1 percent but less than 20 percent impaired, limited or restricted Mobility: Walking and Moving Around Goal Status [phone]): 0 percent impaired, limited or restricted    Colletta Maryland 04/21/2016, 5:06 PM  Roney Marion, [place] Pager [phone] Office [phone]

## 2016-04-22 ENCOUNTER — Encounter: Payer: Self-pay | Admitting: Internal Medicine

## 2016-04-22 ENCOUNTER — Encounter (HOSPITAL_COMMUNITY): Payer: Self-pay | Admitting: Nurse Practitioner

## 2016-04-22 DIAGNOSIS — R55 Syncope and collapse: Secondary | ICD-10-CM

## 2016-04-22 DIAGNOSIS — S8012XA Contusion of left lower leg, initial encounter: Secondary | ICD-10-CM | POA: Diagnosis not present

## 2016-04-22 DIAGNOSIS — I4891 Unspecified atrial fibrillation: Secondary | ICD-10-CM | POA: Diagnosis not present

## 2016-04-22 DIAGNOSIS — S76312A Strain of muscle, fascia and tendon of the posterior muscle group at thigh level, left thigh, initial encounter: Secondary | ICD-10-CM | POA: Diagnosis not present

## 2016-04-22 DIAGNOSIS — I480 Paroxysmal atrial fibrillation: Secondary | ICD-10-CM | POA: Diagnosis not present

## 2016-04-22 DIAGNOSIS — S7012XA Contusion of left thigh, initial encounter: Secondary | ICD-10-CM | POA: Diagnosis not present

## 2016-04-22 DIAGNOSIS — G4733 Obstructive sleep apnea (adult) (pediatric): Secondary | ICD-10-CM

## 2016-04-22 LAB — BASIC METABOLIC PANEL
ANION GAP: 8 (ref 5–15)
BUN: 9 mg/dL (ref 6–20)
CALCIUM: 8.3 mg/dL — AB (ref 8.9–10.3)
CO2: 27 mmol/L (ref 22–32)
Chloride: 107 mmol/L (ref 101–111)
Creatinine, Ser: 0.72 mg/dL (ref 0.61–1.24)
Glucose, Bld: 110 mg/dL — ABNORMAL HIGH (ref 65–99)
Potassium: 3.8 mmol/L (ref 3.5–5.1)
SODIUM: 142 mmol/L (ref 135–145)

## 2016-04-22 LAB — CBC
HEMATOCRIT: 34.7 % — AB (ref 39.0–52.0)
Hemoglobin: 11.6 g/dL — ABNORMAL LOW (ref 13.0–17.0)
MCH: 33.7 pg (ref 26.0–34.0)
MCHC: 33.4 g/dL (ref 30.0–36.0)
MCV: 100.9 fL — ABNORMAL HIGH (ref 78.0–100.0)
Platelets: 138 10*3/uL — ABNORMAL LOW (ref 150–400)
RBC: 3.44 MIL/uL — ABNORMAL LOW (ref 4.22–5.81)
RDW: 13.7 % (ref 11.5–15.5)
WBC: 8.1 10*3/uL (ref 4.0–10.5)

## 2016-04-22 LAB — MAGNESIUM: MAGNESIUM: 2.3 mg/dL (ref 1.7–2.4)

## 2016-04-22 LAB — HEMOGLOBIN AND HEMATOCRIT, BLOOD
HEMATOCRIT: 34 % — AB (ref 39.0–52.0)
HEMOGLOBIN: 10.9 g/dL — AB (ref 13.0–17.0)

## 2016-04-22 LAB — URINE CULTURE: Culture: >=100000 — AB

## 2016-04-22 MED ORDER — RIVAROXABAN 20 MG PO TABS: 20.0000 mg | ORAL_TABLET | Freq: Every day | ORAL | 3 refills | Status: DC

## 2016-04-22 NOTE — Consult Note (Signed)
ELECTROPHYSIOLOGY CONSULT NOTE    Patient ID: Connor [name] MRN: [id number], DOB/AGE: 1933/08/30 82 y.o.  Admit date: 04/20/2016 Date of Consult: 04/22/2016   Primary Physician: Elayne Snare, MD Primary Cardiologist: Dr. Acie Fredrickson Electrophysiologist: Dr. Rayann Heman Requesting MD: Dr. Carles Collet  Reason for Consultation: syncope  HPI: Connor [name] is a 82 y.o. male with PMHx of PAFib, AFlutter (ablated 2012), HTN. OSA uses CPAP, remote DVT, HLD, NICM admitted to [facility] 04/20/16 after a near syncopal event at home.    He reports last week after it had snowed, having slipped coming in from outside with snow on his shoes on a smooth floor, ultimately he states did the "splits".  He did not strike his hamstring but felt it pull with significant pain.  This improved over a couple days did see his PMD and was felt stable.  The day he came in he was lying on the sofa, feeling fine when he started getting severe cramps in the same hamstring, without thinking about it, used a belt to help stretch out the muscle to relieve the cramping, had acute worsening of the pain, stood to have his wife look at the back of his leg and became immediatley weak, clammy and sat back down, still feeling very weak and near faint laid down and his wife called 911.  He remained feeling very weak he reports being told his BP was low, nothing was mentioned in regards to HR or rhythm, he had no perception of palpitations, no CP or SOB.  Since the ER this weak near faint feeling has remained resolved.   Admit LABS: K+ 4.3 >> 3.8 today BUN/Creat 13/0.76 Trop I: 0.04 x 5 H/H 13/40 >>> 10.9/34.0 today WBC 12.6 >> 8.1 today Plts 141 Mag today is 2.3   Past Medical History:  Diagnosis Date  . Abnormal nuclear cardiac imaging test 1/13   Abnormal stress test 05/2011, but cardiac cath 06/2011 showing normal coronary anatomy.  . Anemia   . Atrial flutter [facility])    s/p ablation in March 2012  . BPH (benign prostatic hyperplasia)   .  Chronic anticoagulation    on Xarelto for afib  . DVT (deep venous thrombosis) ([place]) 2/12   in setting of R hip surgery, he developed R femoral DVT  . Hyperlipidemia   . Hypertension   . Obstructive sleep apnea    compliant with CPAP.  managed by Dr Elsworth Soho.  . Osteoarthritis   . Paroxysmal atrial fibrillation ([place]) 9/12   a. documented by Dr Barrett Shell on office EKG 9/12. b. maintained on Tikosyn and Xarelto. c. s/p DCCV 10/2012.  Marland Kitchen Peptic ulcer disease   . Prediabetes   . Premature ventricular contraction   . RBBB (right bundle branch block)   . Vitamin D deficiency      Surgical History:  Past Surgical History:  Procedure Laterality Date  . ABLATION OF DYSRHYTHMIC FOCUS  08/29/2015  . atrial flutter ablation  5/12   by JA  . CARDIAC CATHETERIZATION    . CARDIOVERSION N/A 10/28/2012   Procedure: CARDIOVERSION;  Surgeon: Burnell Blanks, MD;  Location: [facility];  Service: Cardiovascular;  Laterality: N/A;  . ELECTROPHYSIOLOGIC STUDY N/A 08/29/2015   Procedure: Atrial Fibrillation Ablation;  Surgeon: [name], MD;  Location: [facility] CV LAB;  Service: Cardiovascular;  Laterality: N/A;  . LEFT HEART CATHETERIZATION WITH CORONARY ANGIOGRAM N/A 06/07/2011   Procedure: LEFT HEART CATHETERIZATION WITH CORONARY ANGIOGRAM;  Surgeon: Peter M Martinique, MD;  Location: [facility] CATH LAB;  Service: Cardiovascular;  Laterality: N/A;  . TOTAL HIP ARTHROPLASTY  06/04/10   RIGHT HIP  . TOTAL KNEE ARTHROPLASTY    . TRANSURETHRAL RESECTION OF PROSTATE       Prescriptions Prior to Admission  Medication Sig Dispense Refill Last Dose  . acetaminophen (TYLENOL) 500 MG tablet Take 1,000 mg by mouth 2 (two) times daily as needed for moderate pain.   04/20/2016 at Unknown time  . atorvastatin (LIPITOR) 10 MG tablet TAKE 1 TABLET BY MOUTH EVERY DAY (Patient taking differently: TAKE 10 MG BY MOUTH EVERY DAY) 90 tablet 1 04/20/2016 at Unknown time  . Cholecalciferol (VITAMIN D3) 5000 UNITS TABS Take 5,000 Units by  mouth See admin instructions. Take 5000 units by mouth every 4 days   04/19/2016 at Unknown time  . dofetilide (TIKOSYN) 500 MCG capsule TAKE 1 CAPSULE BY MOUTH EVERY 12 HOURS (Patient taking differently: Take 500 mcg by mouth every 12 (twelve) hours. ) 180 capsule 3 04/20/2016 at Unknown time  . methocarbamol (ROBAXIN) 500 MG tablet Take 1 tablet (500 mg total) by mouth at bedtime as needed (pain). 30 tablet 3 2 months ago at Unknown  . metoprolol succinate (TOPROL-XL) 50 MG 24 hr tablet Take 50 mg by mouth daily. Take with or immediately following a meal.   04/20/2016 at Unknown time  . Multiple Vitamin (MULITIVITAMIN) LIQD Take 5 mLs by mouth daily.   Past Week at Unknown time  . Multiple Vitamins-Minerals (ICAPS PO) Take 1 tablet by mouth every evening.    04/19/2016 at Unknown time  . Omega-3 Fatty Acids (OMEGA 3 PO) Take 3 capsules by mouth every evening.    04/19/2016 at Unknown time  . omeprazole (PRILOSEC) 20 MG capsule Take 20 mg by mouth daily.    04/20/2016 at Unknown time  . OVER THE COUNTER MEDICATION Place 3 drops into both eyes daily as needed (dry eyes). "thera tears"   Past Week at Unknown time  . potassium chloride SA (KLOR-CON M20) 20 MEQ tablet TAKE 2 TABLETS (40MEQ) BY MOUTH IN THE MORNING AND 2 TABLETS (40MEQ) IN THE EVENING. 360 tablet 2 04/20/2016 at AM dose  . tamsulosin (FLOMAX) 0.4 MG CAPS capsule TAKE ONE CAPSULE BY MOUTH ONCE DAILY 30 MINUTES AFTER SAME MEAL (Patient taking differently: TAKE 0.4 MG BY MOUTH ONCE DAILY 30 MINUTES AFTER SAME MEAL) 90 capsule 1 04/20/2016 at Unknown time  . traMADol (ULTRAM) 50 MG tablet Take 1 tablet (50 mg total) by mouth every 6 (six) hours as needed (pain). 60 tablet 5 04/19/2016 at Unknown time  . vitamin B-12 (CYANOCOBALAMIN) 500 MCG tablet Take 500 mcg by mouth at bedtime.    04/19/2016 at Unknown time  . XARELTO 20 MG TABS tablet TAKE 1 TABLET BY MOUTH EVERY DAY (Patient taking differently: TAKE 20 MG BY MOUTH EVERY DAY) 90 tablet 3  04/19/2016 at 1930  . clindamycin (CLEOCIN T) 1 % external solution Apply 1 application topically daily as needed (for bumps in scalp). Apply to scalp daily as needed  2 Unknown at Unknown    Inpatient Medications:  . atorvastatin  10 mg Oral Daily  . dofetilide  500 mcg Oral Q12H  . metoprolol succinate  50 mg Oral Daily  . pantoprazole  40 mg Oral Daily  . potassium chloride SA  40 mEq Oral Daily  . sodium chloride flush  3 mL Intravenous Q12H  . tamsulosin  0.4 mg Oral Daily    Allergies:  Allergies  Allergen Reactions  . Penicillins  Itching, Swelling and Other (See Comments)    Has patient had a PCN reaction causing immediate rash, facial/tongue/throat swelling, SOB or lightheadedness with hypotension: Yes Has patient had a PCN reaction causing severe rash involving mucus membranes or skin necrosis: No Has patient had a PCN reaction that required hospitalization No Has patient had a PCN reaction occurring within the last 10 years: No If all of the above answers are "NO", then may proceed with Cephalosporin use.     Social History   Social History  . Marital status: Married    Spouse name: N/A  . Number of children: N/A  . Years of education: N/A   Occupational History  . Not on file.   Social History Main Topics  . Smoking status: Former Smoker    Packs/day: 1.00    Years: 30.00    Types: Cigarettes    Quit date: 05/06/1978  . Smokeless tobacco: Never Used  . Alcohol use 3.0 oz/week    5 Cans of beer per week     Comment: he has just about quit  . Drug use: No  . Sexual activity: Not on file   Other Topics Concern  . Not on file   Social History Narrative   he patient lives in [place] with his spouse.  Retired.     Family History  Problem Relation Age of Onset  . Heart attack Father   . Lung disease Mother   . Arrhythmia Sister   . Atrial fibrillation Brother   . Diabetes Neg Hx      Review of Systems: All other systems reviewed and are  otherwise negative except as noted above.  Physical Exam: Vitals:   04/21/16 1201 04/21/16 2126 04/22/16 0524 04/22/16 0950  BP: 107/63 (!) 122/58 110/65 118/66  Pulse: 82 88 87 81  Resp: 20 18 18    Temp: 98.5 F (36.9 C) 98.4 F (36.9 C) 98.2 F (36.8 C)   TempSrc: Oral Oral Oral   SpO2: 96% 97% 94%   Weight:   225 lb 1.6 oz (102.1 kg)   Height:        GEN- The patient is well appearing, alert and oriented x 3 today.   HEENT: normocephalic, atraumatic; sclera clear, conjunctiva pink; hearing intact; oropharynx clear; neck supple, no JVP Lymph- no cervical lymphadenopathy Lungs- Clear to ausculation bilaterally, normal work of breathing.  No wheezes, rales, rhonchi Heart- Regular rate and rhythm, no murmurs, rubs or gallops, PMI not laterally displaced GI- soft, non-tender, non-distended Extremities- no clubbing, cyanosis, or edema, large area of ecchymosis L hamstring that consumes the entire back of his left thigh, soft, non-tender to light palpation MS- no significant deformity or atrophy Skin- warm and dry, no rash or lesion Psych- euthymic mood, full affect Neuro- no gross deficits observed  Labs:   Lab Results  Component Value Date   WBC 8.1 04/22/2016   HGB 10.9 (L) 04/22/2016   HCT 34.0 (L) 04/22/2016   MCV 100.9 (H) 04/22/2016   PLT 138 (L) 04/22/2016    Recent Labs Lab 04/22/16 0549  NA 142  K 3.8  CL 107  CO2 27  BUN 9  CREATININE 0.72  CALCIUM 8.3*  GLUCOSE 110*      Radiology/Studies:  Ct Femur Left Wo Contrast Result Date: 04/20/2016 CLINICAL DATA:  Fall 2 weeks ago. Hematoma posterior the left femur. Pain. EXAM: CT OF THE LOWER LEFT EXTREMITY WITHOUT CONTRAST TECHNIQUE: Multidetector CT imaging of the lower left extremity was performed according  to the standard protocol. COMPARISON:  None. FINDINGS: Bones/Joint/Cartilage No acute fracture is present. Mild degenerative changes present the left hip with some sclerosis along the acetabulum. The  femoral head is intact. Left total knee arthroplasty is noted. Ligaments Suboptimally assessed by CT. Muscles and Tendons A large posterior intramuscular hematoma is noted within the left hamstring measuring 8.5 x 8.4 x 21.2 cm. This is significantly posterior to the vasculature without evidence for compartment syndrome. Atherosclerotic calcifications are noted within the left femoral artery. Soft tissues Visualized pelvis is intact. Diverticular present within the sigmoid colon without evidence for diverticulitis. IMPRESSION: 1. Large posterior intramuscular hematoma measuring 8.5 x 8.4 x 21.2 cm. 2. Atherosclerosis. 3. No acute or healing fracture. 4. Left total knee arthroplasty. 5. Degenerative changes at the left hip. Electronically Signed   By: San Morelle M.D.   On: 04/20/2016 18:35    EKG: SR, RBBB QRS duration 148-166ms, given this QTc is acceptable TELEMETRY: SR, occ PVC's, NSVT x5 beats noted 02/26/16: TTE Study Conclusions - Left ventricle: The cavity size was normal. There was mild   concentric hypertrophy. Systolic function was mildly reduced. The   estimated ejection fraction was in the range of 45% to 50%. Wall   motion was normal; there were no regional wall motion   abnormalities. Doppler parameters are consistent with abnormal   left ventricular relaxation (grade 1 diastolic dysfunction). - Aortic valve: Transvalvular velocity was within the normal range.   There was no stenosis. There was mild regurgitation. - Mitral valve: Transvalvular velocity was within the normal range.   There was no evidence for stenosis. There was mild regurgitation. - Left atrium: The atrium was moderately dilated. - Right ventricle: The cavity size was normal. Wall thickness was   normal. Systolic function was normal. - Atrial septum: No defect or patent foramen ovale was identified   by color flow Doppler. - Tricuspid valve: There was mild regurgitation. - Pulmonary arteries: Systolic  pressure was mildly increased. PA   peak pressure: 41 mm Hg (S).  08/29/15: EP/Ablation,Dr. Allred  CONCLUSIONS: 1. Sinus rhythm upon presentation.   2. 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 Successful electrical isolation and anatomical encircling of all four pulmonary veins with radiofrequency current. 4. No inducible arrhythmias following ablation both on and off of dobutamine 5. No early apparent complications.  Assessment and Plan:   1. Near syncope     Associated with severe LE pain, occurred upon standing     Recurrent near syncope in ER with standing to reported BP 83/62 (supine 93/62) treated with IVF     Likely vaso-vagal in response to pain  2. Mechanical slip/fall     L hamstring hematoma     A/c held, yesterday's note from orthopedic surgery recommends 3-4 days if remains uncomplicated  3. PAFib     CHA2DS2Vasc is at least 4, on Tikosyn (Xarelto held here)     QTc is stable     Calc creat clearance 118, continue 591mcg BID      K+ 3.8 (replaced already)      Mag 2.3      Med list reviewed  4. HTN     Stable       Signed, Tommye Standard, PA-C 04/22/2016 12:58 PM   I have seen and examined this patient with Tommye Standard.  Agree with above, note added to reflect my findings.  On exam, regula rhythm, no murmurs, lungs clear. Presented after an episode  of syncope. Per the patient, had an episode of severe pain due to leg cramping. It isl ikely that his episode was a vagal response to the pain. At this time, would continue current management for hematoma on left hamstring.  As there is a reversible cause, would not restrict driving.  Mabel Roll M. Ayush Boulet MD 04/22/2016 8:45 PM

## 2016-04-22 NOTE — Discharge Summary (Signed)
Physician Discharge Summary  Connor [name] [id number] DOB: 1933-08-23 DOA: 04/20/2016  PCP: Elayne Snare, MD  Admit date: 04/20/2016 Discharge date: 04/22/2016  Admitted From: Home Disposition:  home  Recommendations for Outpatient Follow-up:  1. Follow up with PCP in 1-2 weeks 2. Please obtain CBC on 12/21 or 12/22   Home Health:No Equipment/Devices:no  Discharge Condition:Stable CODE STATUS: FULL Diet recommendation: Heart Healthy   Brief/Interim Summary: 82 year old male with PAF on Xarelto, HTN, hyperlipidemia,Recent slip on ice last week pulling his left hamstring. Presented with cramping and swollen left leg and hematoma.   He had been seen by Dr. Noemi Chapel, recommended observe the site of injury carefully and start taking antibiotics if developed infection. He attempted to get up from the bed and became extremely dizzy, sweaty, weak and felt near passing out.  He was initially hypotensive in ED and could not tolerated orthostatics.  He was given 2L crystalloid with improvement.  He was seen by ortho during the hospitalization who recommended holding anticoagulation x 3-4 days.  Cardiology saw pt and did not recommend any changes.  Discharge Diagnoses:  Near Syncope -Likely secondary to hematoma and vagal response from pain -Last echocardiogram 02/26/2016 showed an EF of [id number], grade 1 diastolic dysfunction, no regional wall motion abnormalities, no defect or patent foramen ovale. -Orthostatic vitals positive upon standing, BP dropped to 89/61. -Clinically improved after IVF  Large left thigh hematoma -CT of the left thigh: Large posterior intramuscular hematoma measuring 8.5 x 8.4 x 21.2 cm, no acute or healing fracture. -Patient currently neurologically intact -Hematoma/bruising has been outlined and remained stable -Xarelto held -H&H remaining stable -Orthopedics, Dr. French Ana, consulted and appreciated -restart Xarelto 04/23/16 evening -f/u PCP for Hgb check on  12/21 or 12/22  Paroxysmal atrial fibrillation -Currently in sinus rhythm -CHADSVASC 4 -Xarelto held. Spoke with Dr. French Ana, orthopedics, recommended holding for approximately 3-4 days. -appreciate cardiology eval -continue Tikosyn  Essential hypertension -Home medications held initially -metoprolol succinate restarted and pt tolerated well  Hyperlippidemia -Continue statin  NSVT -Overnight, patient had 6 beat run of NSVT and was asymptomatic. -Potassium was replaced. Magnesium 1.8 -replace magnesium and monitor   Discharge Instructions  Discharge Instructions    Diet - low sodium heart healthy    Complete by:  As directed    Increase activity slowly    Complete by:  As directed      Allergies as of 04/22/2016      Reactions   Penicillins Itching, Swelling, Other (See Comments)   Has patient had a PCN reaction causing immediate rash, facial/tongue/throat swelling, SOB or lightheadedness with hypotension: Yes Has patient had a PCN reaction causing severe rash involving mucus membranes or skin necrosis: No Has patient had a PCN reaction that required hospitalization No Has patient had a PCN reaction occurring within the last 10 years: No If all of the above answers are "NO", then may proceed with Cephalosporin use.      Medication List    TAKE these medications   acetaminophen 500 MG tablet Commonly known as:  TYLENOL Take 1,000 mg by mouth 2 (two) times daily as needed for moderate pain.   atorvastatin 10 MG tablet Commonly known as:  LIPITOR TAKE 1 TABLET BY MOUTH EVERY DAY What changed:  See the new instructions.   clindamycin 1 % external solution Commonly known as:  CLEOCIN T Apply 1 application topically daily as needed (for bumps in scalp). Apply to scalp daily as needed   dofetilide 500 MCG capsule  Commonly known as:  TIKOSYN TAKE 1 CAPSULE BY MOUTH EVERY 12 HOURS What changed:  how much to take  how to take this  when to take  this  additional instructions   ICAPS PO Take 1 tablet by mouth every evening.   methocarbamol 500 MG tablet Commonly known as:  ROBAXIN Take 1 tablet (500 mg total) by mouth at bedtime as needed (pain).   metoprolol succinate 50 MG 24 hr tablet Commonly known as:  TOPROL-XL Take 50 mg by mouth daily. Take with or immediately following a meal.   multivitamin Liqd Take 5 mLs by mouth daily.   OMEGA 3 PO Take 3 capsules by mouth every evening.   omeprazole 20 MG capsule Commonly known as:  PRILOSEC Take 20 mg by mouth daily.   OVER THE COUNTER MEDICATION Place 3 drops into both eyes daily as needed (dry eyes). "thera tears"   potassium chloride SA 20 MEQ tablet Commonly known as:  KLOR-CON M20 TAKE 2 TABLETS (40MEQ) BY MOUTH IN THE MORNING AND 2 TABLETS (40MEQ) IN THE EVENING.   rivaroxaban 20 MG Tabs tablet Commonly known as:  XARELTO Take 1 tablet (20 mg total) by mouth daily. Start taking on:  04/23/2016 What changed:  See the new instructions.   tamsulosin 0.4 MG Caps capsule Commonly known as:  FLOMAX TAKE ONE CAPSULE BY MOUTH ONCE DAILY 30 MINUTES AFTER SAME MEAL What changed:  See the new instructions.   traMADol 50 MG tablet Commonly known as:  ULTRAM Take 1 tablet (50 mg total) by mouth every 6 (six) hours as needed (pain).   vitamin B-12 500 MCG tablet Commonly known as:  CYANOCOBALAMIN Take 500 mcg by mouth at bedtime.   Vitamin D3 5000 units Tabs Take 5,000 Units by mouth See admin instructions. Take 5000 units by mouth every 4 days       Allergies  Allergen Reactions  . Penicillins Itching, Swelling and Other (See Comments)    Has patient had a PCN reaction causing immediate rash, facial/tongue/throat swelling, SOB or lightheadedness with hypotension: Yes Has patient had a PCN reaction causing severe rash involving mucus membranes or skin necrosis: No Has patient had a PCN reaction that required hospitalization No Has patient had a PCN  reaction occurring within the last 10 years: No If all of the above answers are "NO", then may proceed with Cephalosporin use.     Consultations:  [facility] cardiology   Procedures/Studies: Ct Femur Left Wo Contrast  Result Date: 04/20/2016 CLINICAL DATA:  Fall 2 weeks ago. Hematoma posterior the left femur. Pain. EXAM: CT OF THE LOWER LEFT EXTREMITY WITHOUT CONTRAST TECHNIQUE: Multidetector CT imaging of the lower left extremity was performed according to the standard protocol. COMPARISON:  None. FINDINGS: Bones/Joint/Cartilage No acute fracture is present. Mild degenerative changes present the left hip with some sclerosis along the acetabulum. The femoral head is intact. Left total knee arthroplasty is noted. Ligaments Suboptimally assessed by CT. Muscles and Tendons A large posterior intramuscular hematoma is noted within the left hamstring measuring 8.5 x 8.4 x 21.2 cm. This is significantly posterior to the vasculature without evidence for compartment syndrome. Atherosclerotic calcifications are noted within the left femoral artery. Soft tissues Visualized pelvis is intact. Diverticular present within the sigmoid colon without evidence for diverticulitis. IMPRESSION: 1. Large posterior intramuscular hematoma measuring 8.5 x 8.4 x 21.2 cm. 2. Atherosclerosis. 3. No acute or healing fracture. 4. Left total knee arthroplasty. 5. Degenerative changes at the left hip. Electronically  Signed   By: San Morelle M.D.   On: 04/20/2016 18:35         Discharge Exam: Vitals:   04/22/16 0950 04/22/16 1200  BP: 118/66 109/66  Pulse: 81 83  Resp:  20  Temp:  98.1 F (36.7 C)   Vitals:   04/21/16 2126 04/22/16 0524 04/22/16 0950 04/22/16 1200  BP: (!) 122/58 110/65 118/66 109/66  Pulse: 88 87 81 83  Resp: 18 18  20   Temp: 98.4 F (36.9 C) 98.2 F (36.8 C)  98.1 F (36.7 C)  TempSrc: Oral Oral  Oral  SpO2: 97% 94%    Weight:  102.1 kg (225 lb 1.6 oz)    Height:         General: Pt is alert, awake, not in acute distress Cardiovascular: RRR, S1/S2 +, no rubs, no gallops Respiratory: CTA bilaterally, no wheezing, no rhonchi Abdominal: Soft, NT, ND, bowel sounds + Extremities: large L-thigh hematoma without crepitance. Cap refill < 2 sec   The results of significant diagnostics from this hospitalization (including imaging, microbiology, ancillary and laboratory) are listed below for reference.    Significant Diagnostic Studies: Ct Femur Left Wo Contrast  Result Date: 04/20/2016 CLINICAL DATA:  Fall 2 weeks ago. Hematoma posterior the left femur. Pain. EXAM: CT OF THE LOWER LEFT EXTREMITY WITHOUT CONTRAST TECHNIQUE: Multidetector CT imaging of the lower left extremity was performed according to the standard protocol. COMPARISON:  None. FINDINGS: Bones/Joint/Cartilage No acute fracture is present. Mild degenerative changes present the left hip with some sclerosis along the acetabulum. The femoral head is intact. Left total knee arthroplasty is noted. Ligaments Suboptimally assessed by CT. Muscles and Tendons A large posterior intramuscular hematoma is noted within the left hamstring measuring 8.5 x 8.4 x 21.2 cm. This is significantly posterior to the vasculature without evidence for compartment syndrome. Atherosclerotic calcifications are noted within the left femoral artery. Soft tissues Visualized pelvis is intact. Diverticular present within the sigmoid colon without evidence for diverticulitis. IMPRESSION: 1. Large posterior intramuscular hematoma measuring 8.5 x 8.4 x 21.2 cm. 2. Atherosclerosis. 3. No acute or healing fracture. 4. Left total knee arthroplasty. 5. Degenerative changes at the left hip. Electronically Signed   By: San Morelle M.D.   On: 04/20/2016 18:35     Microbiology: Recent Results (from the past 240 hour(s))  Urine culture     Status: Abnormal   Collection Time: 04/20/16  2:23 PM  Result Value Ref Range Status   Specimen  Description URINE, RANDOM  Final   Special Requests NONE  Final   Culture >=100,000 COLONIES/mL ENTEROCOCCUS FAECALIS (A)  Final   Report Status 04/22/2016 FINAL  Final   Organism ID, Bacteria ENTEROCOCCUS FAECALIS (A)  Final      Susceptibility   Enterococcus faecalis - MIC*    AMPICILLIN <=2 SENSITIVE Sensitive     LEVOFLOXACIN 1 SENSITIVE Sensitive     NITROFURANTOIN <=16 SENSITIVE Sensitive     VANCOMYCIN 1 SENSITIVE Sensitive     * >=100,000 COLONIES/mL ENTEROCOCCUS FAECALIS     Labs: Basic Metabolic Panel:  Recent Labs Lab 04/20/16 1329 04/21/16 0429 04/22/16 0549  NA 141 142 142  K 4.3 3.6 3.8  CL 108 108 107  CO2 29 26 27   GLUCOSE 121* 101* 110*  BUN 13 8 9   CREATININE 0.76 0.70 0.72  CALCIUM 8.5* 8.0* 8.3*  MG  --  1.8 2.3   Liver Function Tests: No results for input(s): AST, ALT, ALKPHOS, BILITOT, PROT, ALBUMIN  in the last 168 hours. No results for input(s): LIPASE, AMYLASE in the last 168 hours. No results for input(s): AMMONIA in the last 168 hours. CBC:  Recent Labs Lab 04/20/16 1329  04/21/16 0429 04/21/16 1022 04/21/16 1613 04/21/16 2240 04/22/16 0549 04/22/16 0951  WBC 12.6*  --  8.3  --   --   --  8.1  --   HGB 13.4  < > 11.6* 11.6* 11.3* 10.9* 11.6* 10.9*  HCT 40.9  < > 35.5* 35.5* 34.9* 32.2* 34.7* 34.0*  MCV 101.5*  --  102.3*  --   --   --  100.9*  --   PLT 141*  --  139*  --   --   --  138*  --   < > = values in this interval not displayed. Cardiac Enzymes:  Recent Labs Lab 04/20/16 1329 04/20/16 1553 04/20/16 1835 04/20/16 2218 04/21/16 0429  CKTOTAL  --  95  --   --   --   TROPONINI 0.04* 0.04* 0.04* 0.04* 0.04*   BNP: Invalid input(s): POCBNP CBG: No results for input(s): GLUCAP in the last 168 hours.  Time coordinating discharge:  Greater than 30 minutes  Signed:  Loyalty Arentz, DO [facility] Pager: [phone] 04/22/2016, 2:48 PM

## 2016-04-22 NOTE — Progress Notes (Signed)
Pt. Had a 5 beat run of VTach. MD notified. Waiting on possible orders. Pt. Asymptomatic, asleep. Will continue to monitor.   Laurice Kimmons, RN

## 2016-04-22 NOTE — Progress Notes (Signed)
Pt due tikosyn this am.  QTc 503 notified Dr Tat and also unable to locate cardiology assisgn.  Instruction to hold on to med and spoke with cardiology who will be seeing pt this am.  Will continue to monitor.  Karie Kirks, Therapist, sports.

## 2016-04-22 NOTE — Progress Notes (Signed)
Patient with no complaints or concerns during 7pm - 7am shift.  Dorion Petillo, RN 

## 2016-04-22 NOTE — Progress Notes (Signed)
Physical Therapy Treatment/Discharge Summary Patient Details Name: Connor [name] MRN: [id number] DOB: Mar 27, 1934 Today's Date: 04/22/2016    History of Present Illness Admitted with near syncope; Mr. Pilley had been stretching his L hamstring due to a cramp (had recently pulled it after a fall), causing an intramuscular tear and bleeding leading to a hematoma; Orthostatic on admit; history of a-fib and had been on blood thinners    PT Comments    Pt reports feeling confident with his mobility at this time. Able to ambulate 400 ft with SPC modified independent. Pt does have slower pattern due to reports of tenderness through the Lt hamstring musculature. Based upon the patient's current mobility level, will D/C PT services. Recommending OPPT regarding hamstring injury. Please re-order PT services if any changes in status occur. Pt and spouse in agreement and deny any questions or concerns.   Follow Up Recommendations  Outpatient PT     Equipment Recommendations  None recommended by PT (Pt reports having cane at home)    Recommendations for Other Services       Precautions / Restrictions Precautions Precautions: None Restrictions Weight Bearing Restrictions: No    Mobility  Bed Mobility Overal bed mobility: Needs Assistance Bed Mobility: Supine to Sit;Sit to Supine     Supine to sit: Independent Sit to supine: Independent   General bed mobility comments: no assist needed  Transfers Overall transfer level: Needs assistance Equipment used: None Transfers: Sit to/from Stand Sit to Stand: Independent         General transfer comment: pt guarded with his mobility but no assistance needed.   Ambulation/Gait Ambulation/Gait assistance: Modified independent (Device/Increase time) Ambulation Distance (Feet): 400 Feet (+12' X1) Assistive device: Straight cane Gait Pattern/deviations: Step-through pattern;Decreased stride length Gait velocity: decreased   General Gait  Details: no loss of balance during ambulation, ambulating to bathroom without device.    Stairs Stairs:  (declined, reports feeling confident)          Wheelchair Mobility    Modified Rankin (Stroke Patients Only)       Balance Overall balance assessment: Needs assistance Sitting-balance support: No upper extremity supported Sitting balance-Leahy Scale: Normal     Standing balance support: No upper extremity supported Standing balance-Leahy Scale: Fair Standing balance comment: using SPC for longer ambulation                    Cognition Arousal/Alertness: Awake/alert Behavior During Therapy: WFL for tasks assessed/performed Overall Cognitive Status: Within Functional Limits for tasks assessed                      Exercises      General Comments        Pertinent Vitals/Pain Pain Assessment: Faces Faces Pain Scale: Hurts a little bit Pain Location: Lt hamstring region, no pain at rest Pain Descriptors / Indicators: Sore Pain Intervention(s): Monitored during session    Home Living                      Prior Function            PT Goals (current goals can now be found in the care plan section) Acute Rehab PT Goals Patient Stated Goal: get up and moving again PT Goal Formulation: With patient Time For Goal Achievement: 04/28/16 Potential to Achieve Goals: Good Progress towards PT goals: Goals met and updated - see care plan    Frequency    Min 3X/week  PT Plan Current plan remains appropriate    Co-evaluation             End of Session Equipment Utilized During Treatment: Gait belt Activity Tolerance: Patient tolerated treatment well Patient left: in bed;with call bell/phone within reach;with family/visitor present     Time: 7121-9758 PT Time Calculation (min) (ACUTE ONLY): 24 min  Charges:  $Gait Training: 23-37 mins                    G Codes:  Functional Assessment Tool Used: Clinical  Judgement Functional Limitation: Mobility: Walking and moving around Mobility: Walking and Moving Around Current Status ([id number]): At least 1 percent but less than 20 percent impaired, limited or restricted Mobility: Walking and Moving Around Goal Status [phone]): At least 1 percent but less than 20 percent impaired, limited or restricted Mobility: Walking and Moving Around Discharge Status [phone]): At least 1 percent but less than 20 percent impaired, limited or restricted   Cassell Clement, PT, CSCS Pager [phone] Office [phone]  04/22/2016, 12:40 PM

## 2016-04-22 NOTE — Progress Notes (Signed)
Patient given discharge instructions and all questions answered.  Patient discharged via wheelchair with all belongings.   

## 2016-04-22 NOTE — Progress Notes (Signed)
At 1010 informed Dr.Tat pt's QTc 496 ms per EKG from 503 from tele strip and if ok to go ahead and give tikosyn.   MD instructed ok to give.  Karie Kirks, Therapist, sports.

## 2016-04-23 ENCOUNTER — Telehealth: Payer: Self-pay | Admitting: Endocrinology

## 2016-04-26 ENCOUNTER — Encounter: Payer: Self-pay | Admitting: Endocrinology

## 2016-04-26 ENCOUNTER — Ambulatory Visit (INDEPENDENT_AMBULATORY_CARE_PROVIDER_SITE_OTHER): Payer: Medicare Other | Admitting: Endocrinology

## 2016-04-26 VITALS — BP 104/74 | HR 88 | Temp 98.0°F | Resp 16 | Ht 71.75 in | Wt 223.4 lb

## 2016-04-26 DIAGNOSIS — I951 Orthostatic hypotension: Secondary | ICD-10-CM | POA: Diagnosis not present

## 2016-04-26 DIAGNOSIS — D500 Iron deficiency anemia secondary to blood loss (chronic): Secondary | ICD-10-CM | POA: Diagnosis not present

## 2016-04-26 LAB — CBC WITH DIFFERENTIAL/PLATELET
Basophils Absolute: 0 10*3/uL (ref 0.0–0.1)
Basophils Relative: 0.4 % (ref 0.0–3.0)
EOS PCT: 4.2 % (ref 0.0–5.0)
Eosinophils Absolute: 0.3 10*3/uL (ref 0.0–0.7)
HEMATOCRIT: 36.7 % — AB (ref 39.0–52.0)
Hemoglobin: 12.7 g/dL — ABNORMAL LOW (ref 13.0–17.0)
LYMPHS ABS: 0.8 10*3/uL (ref 0.7–4.0)
LYMPHS PCT: 11.3 % — AB (ref 12.0–46.0)
MCHC: 34.6 g/dL (ref 30.0–36.0)
MCV: 100.1 fl — ABNORMAL HIGH (ref 78.0–100.0)
MONOS PCT: 10.8 % (ref 3.0–12.0)
Monocytes Absolute: 0.8 10*3/uL (ref 0.1–1.0)
NEUTROS ABS: 5.1 10*3/uL (ref 1.4–7.7)
NEUTROS PCT: 73.3 % (ref 43.0–77.0)
Platelets: 195 10*3/uL (ref 150.0–400.0)
RBC: 3.67 Mil/uL — AB (ref 4.22–5.81)
RDW: 14.3 % (ref 11.5–15.5)
WBC: 6.9 10*3/uL (ref 4.0–10.5)

## 2016-04-26 LAB — COMPREHENSIVE METABOLIC PANEL
ALK PHOS: 54 U/L (ref 39–117)
ALT: 21 U/L (ref 0–53)
AST: 19 U/L (ref 0–37)
Albumin: 3.9 g/dL (ref 3.5–5.2)
BUN: 14 mg/dL (ref 6–23)
CO2: 31 meq/L (ref 19–32)
Calcium: 9 mg/dL (ref 8.4–10.5)
Chloride: 105 mEq/L (ref 96–112)
Creatinine, Ser: 0.72 mg/dL (ref 0.40–1.50)
GFR: 111.6 mL/min (ref >60.00)
GLUCOSE: 128 mg/dL — AB (ref 70–99)
POTASSIUM: 4.2 meq/L (ref 3.5–5.1)
Sodium: 141 mEq/L (ref 135–145)
Total Bilirubin: 1.7 mg/dL — ABNORMAL HIGH (ref 0.2–1.2)
Total Protein: 6.9 g/dL (ref 6.0–8.3)

## 2016-04-26 NOTE — Patient Instructions (Signed)
Take 325mg iron daily.

## 2016-04-26 NOTE — Progress Notes (Signed)
Subjective:    Patient ID: Connor [name], male    DOB: 11/09/1933, 82 y.o.   MRN: [id number]  HPI   Chief complaint: Follow-up of hospitalization  The patient had a syncopal episode related to blood loss, hypokalemia and large amount of bleeding in his left leg secondary to being on Xarelto. He had fallen and injured his leg and then apparently started bleeding after he was doing a lot of stretching to relieve cramps. His Xarelto was stopped for 3 days and restarted on 19th He does not feel lightheaded or week now He does check his blood pressure periodically at home and is still on metoprolol  No recent issues with palpitations or recurrent atrial fibrillation    Allergies as of 04/26/2016      Reactions   Penicillins Itching, Swelling, Other (See Comments)   Has patient had a PCN reaction causing immediate rash, facial/tongue/throat swelling, SOB or lightheadedness with hypotension: Yes Has patient had a PCN reaction causing severe rash involving mucus membranes or skin necrosis: No Has patient had a PCN reaction that required hospitalization No Has patient had a PCN reaction occurring within the last 10 years: No If all of the above answers are "NO", then may proceed with Cephalosporin use.      Medication List       Accurate as of 04/26/16 10:49 AM. Always use your most recent med list.          acetaminophen 500 MG tablet Commonly known as:  TYLENOL Take 1,000 mg by mouth 2 (two) times daily as needed for moderate pain.   atorvastatin 10 MG tablet Commonly known as:  LIPITOR TAKE 1 TABLET BY MOUTH EVERY DAY   cephALEXin 500 MG capsule Commonly known as:  KEFLEX Take 500 mg by mouth 4 (four) times daily.   clindamycin 1 % external solution Commonly known as:  CLEOCIN T Apply 1 application topically daily as needed (for bumps in scalp). Apply to scalp daily as needed   dofetilide 500 MCG capsule Commonly known as:  TIKOSYN TAKE 1 CAPSULE BY MOUTH EVERY 12  HOURS   ICAPS PO Take 1 tablet by mouth every evening.   methocarbamol 500 MG tablet Commonly known as:  ROBAXIN Take 1 tablet (500 mg total) by mouth at bedtime as needed (pain).   metoprolol succinate 50 MG 24 hr tablet Commonly known as:  TOPROL-XL Take 50 mg by mouth daily. Take with or immediately following a meal.   multivitamin Liqd Take 5 mLs by mouth daily.   OMEGA 3 PO Take 3 capsules by mouth every evening.   omeprazole 20 MG capsule Commonly known as:  PRILOSEC Take 20 mg by mouth daily.   OVER THE COUNTER MEDICATION Place 3 drops into both eyes daily as needed (dry eyes). "thera tears"   potassium chloride SA 20 MEQ tablet Commonly known as:  KLOR-CON M20 TAKE 2 TABLETS (40MEQ) BY MOUTH IN THE MORNING AND 2 TABLETS (40MEQ) IN THE EVENING.   rivaroxaban 20 MG Tabs tablet Commonly known as:  XARELTO Take 1 tablet (20 mg total) by mouth daily.   tamsulosin 0.4 MG Caps capsule Commonly known as:  FLOMAX TAKE ONE CAPSULE BY MOUTH ONCE DAILY 30 MINUTES AFTER SAME MEAL   traMADol 50 MG tablet Commonly known as:  ULTRAM Take 1 tablet (50 mg total) by mouth every 6 (six) hours as needed (pain).   vitamin B-12 500 MCG tablet Commonly known as:  CYANOCOBALAMIN Take 500 mcg by mouth  at bedtime.   Vitamin D3 5000 units Tabs Take 5,000 Units by mouth See admin instructions. Take 5000 units by mouth every 4 days      Past Medical History:  Diagnosis Date  . Abnormal nuclear cardiac imaging test 1/13   Abnormal stress test 05/2011, but cardiac cath 06/2011 showing normal coronary anatomy.  . Anemia   . Atrial flutter [facility])    s/p ablation in March 2012  . BPH (benign prostatic hyperplasia)   . Chronic anticoagulation    on Xarelto for afib  . DVT (deep venous thrombosis) ([place]) 2/12   in setting of R hip surgery, he developed R femoral DVT  . Hyperlipidemia   . Hypertension   . Obstructive sleep apnea    compliant with CPAP.  managed by Dr Elsworth Soho.  .  Osteoarthritis   . Paroxysmal atrial fibrillation ([place]) 9/12   a. documented by Dr Barrett Shell on office EKG 9/12. b. maintained on Tikosyn and Xarelto. c. s/p DCCV 10/2012.  Marland Kitchen Peptic ulcer disease   . Prediabetes   . Premature ventricular contraction   . RBBB (right bundle branch block)   . Vitamin D deficiency      Review of Systems     Objective:   Physical Exam   BP 122/64   Pulse 88   Temp 98 F (36.7 C)   Resp 16   Ht 5' 11.75" (1.822 m)   Wt 223 lb 6 oz (101.3 kg)   BMI 30.51 kg/m   STANDING blood pressure = 104/ 74 He has an extensive bruising area on his left leg starting from his high down to his lower leg along with swelling. No areas of redness, warmth or tenderness Heart rate is regular, 96 No ankle edema      Assessment & Plan:    Need to reassess his hemoglobin has had gone down below 11 during hospitalization from his extensive bleeding.  Also recommended that he start taking iron once a day to replenish his iron stores  Since he is not having any signs of increased swelling or bleeding in his legs he can continue throughout the  Continue follow-up with cardiologist for his arrhythmia and need to determine whether he needs to stay on Xarelto long-term  He will call if he develops lightheadedness, will not reduce his metoprolol currently because of tendency to tachycardia     Brietta Manso   ADDENDUM: Hemoglobin improved  Lab Results  Component Value Date   WBC 6.9 04/26/2016   HGB 12.7 (L) 04/26/2016   HCT 36.7 (L) 04/26/2016   MCV 100.1 (H) 04/26/2016   PLT 195.0 04/26/2016

## 2016-05-02 ENCOUNTER — Other Ambulatory Visit (HOSPITAL_COMMUNITY): Payer: Self-pay | Admitting: Orthopedic Surgery

## 2016-05-02 DIAGNOSIS — M7989 Other specified soft tissue disorders: Principal | ICD-10-CM

## 2016-05-02 DIAGNOSIS — M79605 Pain in left leg: Secondary | ICD-10-CM

## 2016-05-03 ENCOUNTER — Ambulatory Visit (HOSPITAL_COMMUNITY)
Admission: RE | Admit: 2016-05-03 | Discharge: 2016-05-03 | Disposition: A | Discharge status: Home / Self Care | Payer: Medicare Other | Source: Ambulatory Visit | Attending: Endocrinology | Admitting: Endocrinology

## 2016-05-03 DIAGNOSIS — S8982XA Other specified injuries of left lower leg, initial encounter: Secondary | ICD-10-CM | POA: Insufficient documentation

## 2016-05-03 DIAGNOSIS — M7989 Other specified soft tissue disorders: Secondary | ICD-10-CM | POA: Diagnosis present

## 2016-05-03 DIAGNOSIS — M79605 Pain in left leg: Secondary | ICD-10-CM | POA: Diagnosis present

## 2016-05-03 NOTE — Progress Notes (Signed)
VASCULAR LAB PRELIMINARY  PRELIMINARY  PRELIMINARY  PRELIMINARY  Left lower extremity venous duplex completed.    Preliminary report:  Left:  No evidence of DVT, superficial thrombosis, or Baker's cyst.  Dewel Lotter, RVS 05/03/2016, 11:58 AM

## 2016-05-13 MED ORDER — TRAMADOL HCL 50 MG PO TABS: 50.0000 mg | ORAL_TABLET | Freq: Four times a day (QID) | ORAL | 5 refills | Status: DC | PRN

## 2016-05-13 NOTE — Telephone Encounter (Signed)
New prescription a new of   traMADol (ULTRAM) 50 MG tablet 60 tablet   [id number] - [place], [place] - [place] [phone] (Phone) [phone] (Fax)

## 2016-05-13 NOTE — Telephone Encounter (Signed)
MD signed the rx for tramadol and rx has been faxed to [facility] in [place].

## 2016-05-13 NOTE — Telephone Encounter (Signed)
Rx printed and given to MD to sign.

## 2016-05-30 ENCOUNTER — Encounter (HOSPITAL_COMMUNITY): Payer: Self-pay | Admitting: Nurse Practitioner

## 2016-05-30 ENCOUNTER — Ambulatory Visit (HOSPITAL_COMMUNITY)
Admission: RE | Admit: 2016-05-30 | Discharge: 2016-05-30 | Disposition: A | Discharge status: Home / Self Care | Payer: Medicare Other | Source: Ambulatory Visit | Attending: Nurse Practitioner | Admitting: Nurse Practitioner

## 2016-05-30 VITALS — BP 126/78 | HR 80 | Ht 71.75 in | Wt 231.4 lb

## 2016-05-30 DIAGNOSIS — R7303 Prediabetes: Secondary | ICD-10-CM | POA: Diagnosis not present

## 2016-05-30 DIAGNOSIS — I480 Paroxysmal atrial fibrillation: Secondary | ICD-10-CM | POA: Insufficient documentation

## 2016-05-30 DIAGNOSIS — Z880 Allergy status to penicillin: Secondary | ICD-10-CM | POA: Insufficient documentation

## 2016-05-30 DIAGNOSIS — Z86718 Personal history of other venous thrombosis and embolism: Secondary | ICD-10-CM | POA: Insufficient documentation

## 2016-05-30 DIAGNOSIS — I4510 Unspecified right bundle-branch block: Secondary | ICD-10-CM | POA: Diagnosis not present

## 2016-05-30 DIAGNOSIS — E785 Hyperlipidemia, unspecified: Secondary | ICD-10-CM | POA: Insufficient documentation

## 2016-05-30 DIAGNOSIS — G4733 Obstructive sleep apnea (adult) (pediatric): Secondary | ICD-10-CM | POA: Diagnosis not present

## 2016-05-30 DIAGNOSIS — N400 Enlarged prostate without lower urinary tract symptoms: Secondary | ICD-10-CM | POA: Insufficient documentation

## 2016-05-30 DIAGNOSIS — Z7901 Long term (current) use of anticoagulants: Secondary | ICD-10-CM | POA: Diagnosis not present

## 2016-05-30 DIAGNOSIS — M1990 Unspecified osteoarthritis, unspecified site: Secondary | ICD-10-CM | POA: Insufficient documentation

## 2016-05-30 DIAGNOSIS — I10 Essential (primary) hypertension: Secondary | ICD-10-CM | POA: Diagnosis not present

## 2016-05-30 DIAGNOSIS — Z87891 Personal history of nicotine dependence: Secondary | ICD-10-CM | POA: Insufficient documentation

## 2016-05-30 DIAGNOSIS — I4892 Unspecified atrial flutter: Secondary | ICD-10-CM | POA: Insufficient documentation

## 2016-05-30 DIAGNOSIS — E559 Vitamin D deficiency, unspecified: Secondary | ICD-10-CM | POA: Diagnosis not present

## 2016-05-30 DIAGNOSIS — Z9889 Other specified postprocedural states: Secondary | ICD-10-CM | POA: Insufficient documentation

## 2016-05-30 DIAGNOSIS — Z96641 Presence of right artificial hip joint: Secondary | ICD-10-CM | POA: Diagnosis not present

## 2016-05-30 LAB — BASIC METABOLIC PANEL
Anion gap: 7 (ref 5–15)
BUN: 13 mg/dL (ref 6–20)
CALCIUM: 8.6 mg/dL — AB (ref 8.9–10.3)
CHLORIDE: 106 mmol/L (ref 101–111)
CO2: 28 mmol/L (ref 22–32)
CREATININE: 0.71 mg/dL (ref 0.61–1.24)
GFR calc Af Amer: >60 mL/min (ref >60)
GFR calc non Af Amer: >60 mL/min (ref >60)
GLUCOSE: 111 mg/dL — AB (ref 65–99)
Potassium: 4.1 mmol/L (ref 3.5–5.1)
SODIUM: 141 mmol/L (ref 135–145)

## 2016-05-30 LAB — MAGNESIUM: Magnesium: 2.2 mg/dL (ref 1.7–2.4)

## 2016-05-30 NOTE — Progress Notes (Signed)
Patient ID: Connor [name], male   DOB: 1933-08-04, 83 y.o.   MRN: [id number]     Primary Care Physician: Elayne Snare, MD Referring Physician: Dr. [name] Connor [name] is a 83 y.o. male with a h/o PAF that recently underwent repeat afib ablation 08/29/15 for f/u in the afib clinic. He reports just one epiosode of afib a few days after ablation. Otherwise staying in SR, very little afib. Continues on tikosyn/xarelto without issues.He did have a torn hamstring with bleeding into left leg which caused hypotension and near syncope and was admitted to [facility]. Xarelto was held x 4 days and has resolved.  Today, he denies symptoms of palpitations, chest pain, shortness of breath, orthopnea, PND, lower extremity edema, dizziness, presyncope, syncope, or neurologic sequela. The patient is tolerating medications without difficulties and is otherwise without complaint today.   Past Medical History:  Diagnosis Date  . Abnormal nuclear cardiac imaging test 1/13   Abnormal stress test 05/2011, but cardiac cath 06/2011 showing normal coronary anatomy.  . Anemia   . Atrial flutter [facility])    s/p ablation in March 2012  . BPH (benign prostatic hyperplasia)   . Chronic anticoagulation    on Xarelto for afib  . DVT (deep venous thrombosis) ([place]) 2/12   in setting of R hip surgery, he developed R femoral DVT  . Hyperlipidemia   . Hypertension   . Obstructive sleep apnea    compliant with CPAP.  managed by Dr Elsworth Soho.  . Osteoarthritis   . Paroxysmal atrial fibrillation ([place]) 9/12   a. documented by Dr Barrett Shell on office EKG 9/12. b. maintained on Tikosyn and Xarelto. c. s/p DCCV 10/2012.  Marland Kitchen Peptic ulcer disease   . Prediabetes   . Premature ventricular contraction   . RBBB (right bundle branch block)   . Vitamin D deficiency    Past Surgical History:  Procedure Laterality Date  . ABLATION OF DYSRHYTHMIC FOCUS  08/29/2015  . atrial flutter ablation  5/12   by JA  . CARDIAC CATHETERIZATION    . CARDIOVERSION  N/A 10/28/2012   Procedure: CARDIOVERSION;  Surgeon: Burnell Blanks, MD;  Location: [place];  Service: Cardiovascular;  Laterality: N/A;  . ELECTROPHYSIOLOGIC STUDY N/A 08/29/2015   Procedure: Atrial Fibrillation Ablation;  Surgeon: [name], MD;  Location: [place] CV LAB;  Service: Cardiovascular;  Laterality: N/A;  . LEFT HEART CATHETERIZATION WITH CORONARY ANGIOGRAM N/A 06/07/2011   Procedure: LEFT HEART CATHETERIZATION WITH CORONARY ANGIOGRAM;  Surgeon: Peter M Martinique, MD;  Location: [facility] CATH LAB;  Service: Cardiovascular;  Laterality: N/A;  . TOTAL HIP ARTHROPLASTY  06/04/10   RIGHT HIP  . TOTAL KNEE ARTHROPLASTY    . TRANSURETHRAL RESECTION OF PROSTATE      Current Outpatient Prescriptions  Medication Sig Dispense Refill  . atorvastatin (LIPITOR) 10 MG tablet TAKE 1 TABLET BY MOUTH EVERY DAY (Patient taking differently: TAKE 10 MG BY MOUTH EVERY DAY) 90 tablet 1  . Cholecalciferol (VITAMIN D3) 5000 UNITS TABS Take 5,000 Units by mouth See admin instructions. Take 5000 units by mouth every 4 days    . clindamycin (CLEOCIN T) 1 % external solution Apply 1 application topically daily as needed (for bumps in scalp). Apply to scalp daily as needed  2  . dofetilide (TIKOSYN) 500 MCG capsule TAKE 1 CAPSULE BY MOUTH EVERY 12 HOURS (Patient taking differently: Take 500 mcg by mouth every 12 (twelve) hours. ) 180 capsule 3  . methocarbamol (ROBAXIN) 500 MG tablet Take  1 tablet (500 mg total) by mouth at bedtime as needed (pain). 30 tablet 3  . metoprolol succinate (TOPROL-XL) 50 MG 24 hr tablet Take 50 mg by mouth daily. Take with or immediately following a meal.    . Multiple Vitamin (MULITIVITAMIN) LIQD Take 5 mLs by mouth daily.    . Multiple Vitamins-Minerals (ICAPS PO) Take 1 tablet by mouth every evening.     . Omega-3 Fatty Acids (OMEGA 3 PO) Take 3 capsules by mouth every evening.     Marland Kitchen omeprazole (PRILOSEC) 20 MG capsule Take 20 mg by mouth daily.     Marland Kitchen OVER THE COUNTER  MEDICATION Place 3 drops into both eyes daily as needed (dry eyes). "thera tears"    . potassium chloride SA (KLOR-CON M20) 20 MEQ tablet TAKE 2 TABLETS (40MEQ) BY MOUTH IN THE MORNING AND 2 TABLETS (40MEQ) IN THE EVENING. 360 tablet 2  . rivaroxaban (XARELTO) 20 MG TABS tablet Take 1 tablet (20 mg total) by mouth daily. 90 tablet 3  . tamsulosin (FLOMAX) 0.4 MG CAPS capsule TAKE ONE CAPSULE BY MOUTH ONCE DAILY 30 MINUTES AFTER SAME MEAL (Patient taking differently: TAKE 0.4 MG BY MOUTH ONCE DAILY 30 MINUTES AFTER SAME MEAL) 90 capsule 1  . traMADol (ULTRAM) 50 MG tablet Take 1 tablet (50 mg total) by mouth every 6 (six) hours as needed (pain). 60 tablet 5  . vitamin B-12 (CYANOCOBALAMIN) 500 MCG tablet Take 500 mcg by mouth at bedtime.     Marland Kitchen acetaminophen (TYLENOL) 500 MG tablet Take 1,000 mg by mouth 2 (two) times daily as needed for moderate pain.     No current facility-administered medications for this encounter.     Allergies  Allergen Reactions  . Penicillins Itching, Swelling and Other (See Comments)    Has patient had a PCN reaction causing immediate rash, facial/tongue/throat swelling, SOB or lightheadedness with hypotension: Yes Has patient had a PCN reaction causing severe rash involving mucus membranes or skin necrosis: No Has patient had a PCN reaction that required hospitalization No Has patient had a PCN reaction occurring within the last 10 years: No If all of the above answers are "NO", then may proceed with Cephalosporin use.     Social History   Social History  . Marital status: Married    Spouse name: N/A  . Number of children: N/A  . Years of education: N/A   Occupational History  . Not on file.   Social History Main Topics  . Smoking status: Former Smoker    Packs/day: 1.00    Years: 30.00    Types: Cigarettes    Quit date: 05/06/1978  . Smokeless tobacco: Never Used  . Alcohol use 3.0 oz/week    5 Cans of beer per week     Comment: he has just about  quit  . Drug use: No  . Sexual activity: Not on file   Other Topics Concern  . Not on file   Social History Narrative   he patient lives in [place] with his spouse.  Retired.    Family History  Problem Relation Age of Onset  . Heart attack Father   . Lung disease Mother   . Arrhythmia Sister   . Atrial fibrillation Brother   . Diabetes Neg Hx     ROS- All systems are reviewed and negative except as per the HPI above  Physical Exam: Vitals:   05/30/16 0952  BP: 126/78  Pulse: 80  Weight: 231 lb 6.4 oz (  105 kg)  Height: 5' 11.75" (1.822 m)    GEN- The patient is well appearing, alert and oriented x 3 today.   Head- normocephalic, atraumatic Eyes-  Sclera clear, conjunctiva pink Ears- hearing intact Oropharynx- clear Neck- supple, no JVP Lymph- no cervical lymphadenopathy Lungs- Clear to ausculation bilaterally, normal work of breathing Heart- Regular rate and rhythm, no murmurs, rubs or gallops, PMI not laterally displaced GI- soft, NT, ND, + BS Extremities- no clubbing, cyanosis, or edema MS- no significant deformity or atrophy Skin- no rash or lesion Psych- euthymic mood, full affect Neuro- strength and sensation are intact  EKG- NSR with RBBB, pr int 158 ms, qrs int 142 ms, qtc 523 ms(qtc stable for pt with RBBB) Epic records reviewed  Assessment and Plan: 1. PAF Maintaining SR Continue tikosyn,xarelto Bmet/mag today  F/u with Dr. Rayann Heman 4/30  afib clinic as needed

## 2016-06-09 ENCOUNTER — Other Ambulatory Visit: Payer: Self-pay | Admitting: Cardiovascular Disease

## 2016-06-09 ENCOUNTER — Other Ambulatory Visit: Payer: Self-pay | Admitting: Endocrinology

## 2016-06-10 ENCOUNTER — Other Ambulatory Visit: Payer: Self-pay | Admitting: *Deleted

## 2016-06-10 MED ORDER — RIVAROXABAN 20 MG PO TABS: 20.0000 mg | ORAL_TABLET | Freq: Every day | ORAL | 1 refills | Status: DC

## 2016-06-10 NOTE — Telephone Encounter (Signed)
Pt last saw Dr Rayann Heman on 02/29/16, last labs on 05/30/16 Creat 0.71, weight 105kg, CrCl 123.24.  Pt is on the appropriate dosage of Xarelto 20mg  QD, will refill rx x 48mos.

## 2016-06-11 ENCOUNTER — Other Ambulatory Visit: Payer: Self-pay

## 2016-06-11 MED ORDER — RIVAROXABAN 20 MG PO TABS: 20.0000 mg | ORAL_TABLET | Freq: Every day | ORAL | 1 refills | Status: DC

## 2016-06-21 ENCOUNTER — Encounter: Payer: Self-pay | Admitting: Internal Medicine

## 2016-06-24 ENCOUNTER — Telehealth: Payer: Self-pay | Admitting: Internal Medicine

## 2016-06-24 ENCOUNTER — Other Ambulatory Visit: Payer: Self-pay | Admitting: *Deleted

## 2016-06-24 MED ORDER — METOPROLOL SUCCINATE ER 50 MG PO TB24: 50.0000 mg | ORAL_TABLET | Freq: Every day | ORAL | 2 refills | Status: DC

## 2016-06-24 NOTE — Telephone Encounter (Signed)
New Message:   She needs clinicail information regarding his Dofetilide.

## 2016-06-25 ENCOUNTER — Telehealth: Payer: Self-pay | Admitting: Endocrinology

## 2016-06-25 NOTE — Telephone Encounter (Signed)
Pt called in requesting referral to the [facility] on [address] near [facility].  He said he has been there before and would like to be referred back for his neck.

## 2016-06-28 ENCOUNTER — Other Ambulatory Visit: Payer: Self-pay | Admitting: Endocrinology

## 2016-06-28 DIAGNOSIS — R42 Dizziness and giddiness: Secondary | ICD-10-CM

## 2016-06-28 DIAGNOSIS — M47812 Spondylosis without myelopathy or radiculopathy, cervical region: Secondary | ICD-10-CM

## 2016-06-28 NOTE — Telephone Encounter (Signed)
done

## 2016-07-01 NOTE — Telephone Encounter (Signed)
Follow Up:    Pt said the insurance company said they have not received the form that they need about his Tikosyn

## 2016-07-02 NOTE — Telephone Encounter (Signed)
Follow up Call   [facility]) is calling in reference to a Prior Auth for Dofetilide . Please call at [phone] and reference # is [id number].   Thanks

## 2016-07-03 NOTE — Telephone Encounter (Addendum)
**Note De-Identified Michaelyn Wall Obfuscation** I called [employer] and did urgent tier exception over the phone with Mitzi Hansen. Per Mitzi Hansen we will have response within 24 hours.

## 2016-07-03 NOTE — Telephone Encounter (Signed)
Spoke with [facility] rep and they faxed over a prior authorization form over a week ago for Tikosyn which needs to be completed before a tier exception can be done. I have not seen a prior auth form, it was sent to the [phone] number. They are requiring this to be completed first.

## 2016-07-03 NOTE — Telephone Encounter (Signed)
Patient called and stated that he has been trying to get a tier reduction on the tikosyn for over one week now. He stated that there is a time frame on this and if it is not completed he will not get his tikosyn. Patient also requested a refill on metoprolol tartrate 25 mg which he stated that he takes one tablet by mouth q6hrs for fast hr. It is not on his current med list but it is listed in medication history. Okay to refill this? Please advise. Thanks, MI

## 2016-07-03 NOTE — Telephone Encounter (Signed)
Faxed exception form on 06/27/16 and then again today.  Please ask Pharm D if they have any samples and put out front for patient.  I have the form if needed

## 2016-07-03 NOTE — Telephone Encounter (Signed)
I have spoke with patient and he has one week supply on hand so he may need some samples if there are any available.

## 2016-07-03 NOTE — Telephone Encounter (Signed)
Form I have is for tier exception.  Will forward to Dotsero Via to see if she has the PA

## 2016-07-04 NOTE — Telephone Encounter (Signed)
Spoke with patient today. Advised him that tier exception for Tikosyn has been denied. He would pay $128.00 for a 90 days supply, which is a large increase from last year. I advised him I would submit an appeal and try to get it lowered. He was grateful for the call.

## 2016-07-09 ENCOUNTER — Ambulatory Visit: Payer: Medicare Other | Attending: Endocrinology | Admitting: Physical Therapy

## 2016-07-09 ENCOUNTER — Encounter: Payer: Self-pay | Admitting: Physical Therapy

## 2016-07-09 DIAGNOSIS — M542 Cervicalgia: Secondary | ICD-10-CM | POA: Insufficient documentation

## 2016-07-09 DIAGNOSIS — R252 Cramp and spasm: Secondary | ICD-10-CM

## 2016-07-09 NOTE — Therapy (Signed)
[address] [place], [place], [address] Phone: [phone]   Fax:  [phone]  Physical Therapy Evaluation  Patient Details  Name: Connor [name] MRN: [id number] Date of Birth: Nov 09, 1933 Referring Provider: Ruffin Frederick  Encounter Date: 07/09/2016      PT End of Session - 07/09/16 0955    Visit Number 1   Date for PT Re-Evaluation 09/08/16   PT Start Time 0930   PT Stop Time 1019   PT Time Calculation (min) 49 min   Activity Tolerance Patient tolerated treatment well   Behavior During Therapy [facility] for tasks assessed/performed      Past Medical History:  Diagnosis Date  . Abnormal nuclear cardiac imaging test 1/13   Abnormal stress test 05/2011, but cardiac cath 06/2011 showing normal coronary anatomy.  . Anemia   . Atrial flutter [facility])    s/p ablation in March 2012  . BPH (benign prostatic hyperplasia)   . Chronic anticoagulation    on Xarelto for afib  . DVT (deep venous thrombosis) ([place]) 2/12   in setting of R hip surgery, he developed R femoral DVT  . Hyperlipidemia   . Hypertension   . Obstructive sleep apnea    compliant with CPAP.  managed by Dr Elsworth Soho.  . Osteoarthritis   . Paroxysmal atrial fibrillation ([place]) 9/12   a. documented by Dr Barrett Shell on office EKG 9/12. b. maintained on Tikosyn and Xarelto. c. s/p DCCV 10/2012.  Marland Kitchen Peptic ulcer disease   . Prediabetes   . Premature ventricular contraction   . RBBB (right bundle branch block)   . Vitamin D deficiency     Past Surgical History:  Procedure Laterality Date  . ABLATION OF DYSRHYTHMIC FOCUS  08/29/2015  . atrial flutter ablation  5/12   by JA  . CARDIAC CATHETERIZATION    . CARDIOVERSION N/A 10/28/2012   Procedure: CARDIOVERSION;  Surgeon: Burnell Blanks, MD;  Location: [place];  Service: Cardiovascular;  Laterality: N/A;  . ELECTROPHYSIOLOGIC STUDY N/A 08/29/2015   Procedure: Atrial Fibrillation Ablation;  Surgeon: [name], MD;  Location: [place] CV LAB;  Service: Cardiovascular;  Laterality: N/A;  . LEFT HEART CATHETERIZATION WITH CORONARY ANGIOGRAM N/A 06/07/2011   Procedure: LEFT HEART CATHETERIZATION WITH CORONARY ANGIOGRAM;  Surgeon: Peter M Martinique, MD;  Location: [facility] CATH LAB;  Service: Cardiovascular;  Laterality: N/A;  . TOTAL HIP ARTHROPLASTY  06/04/10   RIGHT HIP  . TOTAL KNEE ARTHROPLASTY    . TRANSURETHRAL RESECTION OF PROSTATE      There were no vitals filed for this visit.       Subjective Assessment - 07/09/16 0932    Subjective Patient has a history of neck pain.  X-ray shows DDD and spondylosis.  He reports that over the past 6 months with he has had increased neck pain.   Limitations Sitting;Reading;House hold activities   Patient Stated Goals have less pain   Currently in Pain? Yes   Pain Score 2    Pain Location Neck   Pain Orientation Right   Pain Descriptors / Indicators Aching;Sore;Tightness;Spasm   Pain Type Chronic pain   Pain Onset More than a month ago   Pain Frequency Intermittent   Aggravating Factors  sitting, sleeping will increase the pain up to 5/10   Pain Relieving Factors moving, pressure on the spasm helps and can decrease the pain to 0/10   Effect of Pain on Daily Activities difficulty after sitting  and sleeping            [facility] PT Assessment - 07/09/16 0001      Assessment   Medical Diagnosis neck pain   Referring Provider Ruffin Frederick   Onset Date/Surgical Date 06/11/16   Hand Dominance Right   Prior Therapy about 3 years ago     Precautions   Precautions None     Balance Screen   Has the patient fallen in the past 6 months No   Has the patient had a decrease in activity level because of a fear of falling?  No   Is the patient reluctant to leave their home because of a fear of falling?  No     Home Environment   Additional Comments does housework and yardwork     Prior Function   Level of Independence Independent   Vocation Retired    Leisure goes to [employer] 2-3x/week,      Mining engineer Comments fwd head, rounded shoulders     ROM / Strength   AROM / PROM / Strength AROM;Strength     AROM   Overall AROM Comments cervical ROM flexion WNL's, extension decreased 100%, rotation decreased 50%, side bending decreased 50%, he had some pain iwth rotation to the painful side and side bending away from the painful side, shoulder AROM WFL's     Strength   Overall Strength Comments 4+/5 without any increase of pain     Flexibility   Soft Tissue Assessment /Muscle Length --  some tightness in the right UE     Palpation   Palpation comment has tightness with some knots in the right upper trap and the right cervical parapsinals                   [facility] Adult PT Treatment/Exercise - 07/09/16 0001      Modalities   Modalities Moist Heat;Electrical Stimulation     Moist Heat Therapy   Number Minutes Moist Heat 15 Minutes   Moist Heat Location Cervical     Electrical Stimulation   Electrical Stimulation Location right cervical area   Electrical Stimulation Action IFC   Electrical Stimulation Parameters sitting   Electrical Stimulation Goals Pain                PT Education - 07/09/16 0955    Education provided Yes   Education Details cervcial and scapular retraction   Person(s) Educated Patient   Methods Explanation;Demonstration;Handout   Comprehension Verbalized understanding          PT Short Term Goals - 07/09/16 0959      PT SHORT TERM GOAL #1   Title independent with initial HEP   Time 2   Period Weeks   Status New           PT Long Term Goals - 07/09/16 1000      PT LONG TERM GOAL #1   Title understand posture and body mechanics   Time 8   Period Weeks   Status New     PT LONG TERM GOAL #2   Title increase cervical ROM 25%   Time 8   Period Weeks   Status New     PT LONG TERM GOAL #3   Title report 25% easier to back up the car   Time 8    Period Weeks   Status New     PT LONG TERM GOAL #4   Title decrease pain 50%  Time 8   Period Weeks   Status New     PT LONG TERM GOAL #5   Title perform gym exercises safely and independently for scapula stabilization   Time 8   Period Weeks   Status New               Plan - 07/09/16 [id number]    Clinical Impression Statement Patient with a history of neck pain, has DDD and spondylosis, he has mostly right neck pain, he has forward head and rounded shoulders.  Has significant spasms in the right upper trap and neck area.  He has some tightness in the right UE.     Rehab Potential Good   PT Frequency 2x / week   PT Duration 8 weeks   PT Treatment/Interventions Cryotherapy;Electrical Stimulation;Moist Heat;Traction;Ultrasound;Patient/family education;Therapeutic exercise;Therapeutic activities;Manual techniques   PT Next Visit Plan May try traction, gentle PROM of the neck , scapular stabilization   Consulted and Agree with Plan of Care Patient      Patient will benefit from skilled therapeutic intervention in order to improve the following deficits and impairments:  Decreased range of motion, Increased muscle spasms, Impaired flexibility, Postural dysfunction, Improper body mechanics, Pain  Visit Diagnosis: Cervicalgia - Plan: PT plan of care cert/re-cert  Cramp and spasm - Plan: PT plan of care cert/re-cert      G-Codes - [id number]    Functional Assessment Tool Used (Outpatient Only) foto 54% limitation   Functional Limitation Changing and maintaining body position   Changing and Maintaining Body Position Current Status [id number]) At least 40 percent but less than 60 percent impaired, limited or restricted   Changing and Maintaining Body Position Goal Status [id number]) At least 40 percent but less than 60 percent impaired, limited or restricted       Problem List Patient Active Problem List   Diagnosis Date Noted  . Near syncope 04/20/2016  . Leg hematoma, left,  initial encounter 04/20/2016  . Bleeding   . Ecchymosis   . Left hamstring muscle strain   . Muscle tear   . RBBB 08/30/2015  . A-fib ([place]) 08/29/2015  . Chronic systolic dysfunction of left ventricle 07/11/2014  . OSA (obstructive sleep apnea) 04/07/2013  . Multinodular goiter 01/20/2013  . Cervical spondylosis without myelopathy 01/18/2013  . Atrial fibrillation with RVR ([place]) 10/27/2012  . PAF (paroxysmal atrial fibrillation) ([place]) 05/28/2012  . Left ventricular dysfunction 12/17/2011  . Low back pain 09/11/2011  . Fatigue 05/22/2011  . PVC (premature ventricular contraction) 01/18/2011  . Persistent atrial fibrillation ([place]) 01/12/2011  . Hyperlipidemia 08/02/2010  . Hypertension 08/02/2010  . Osteoarthritis 08/02/2010  . BPH (benign prostatic hyperplasia) 08/02/2010  . GERD (gastroesophageal reflux disease) 08/02/2010  . h/o Atrial flutter [facility]) s/p ablation 2012     Lindia Garms W., PT 07/09/2016, 10:27 AM  [address] [place], [place], [address] Phone: [phone]   Fax:  [phone]  Name: Connor [name] MRN: [id number] Date of Birth: February 17, 1934

## 2016-07-10 NOTE — Telephone Encounter (Signed)
Received Tier Exception and he got his Tikosyn for $24 for 3 month supply as opposed to $128 for 3 months.  He was very Patent attorney.

## 2016-07-11 ENCOUNTER — Other Ambulatory Visit: Payer: Self-pay | Admitting: *Deleted

## 2016-07-11 ENCOUNTER — Ambulatory Visit: Payer: Medicare Other | Admitting: Physical Therapy

## 2016-07-11 ENCOUNTER — Encounter: Payer: Self-pay | Admitting: Physical Therapy

## 2016-07-11 DIAGNOSIS — R252 Cramp and spasm: Secondary | ICD-10-CM

## 2016-07-11 DIAGNOSIS — M542 Cervicalgia: Secondary | ICD-10-CM

## 2016-07-11 MED ORDER — METOPROLOL TARTRATE 25 MG PO TABS: ORAL_TABLET | ORAL | 6 refills | Status: DC

## 2016-07-11 NOTE — Therapy (Signed)
[place] [address] [place], [place], [address] Phone: [phone]   Fax:  [phone]  Physical Therapy Treatment  Patient Details  Name: Connor [name] MRN: [id number] Date of Birth: July 25, 1933 Referring Provider: Ruffin Frederick  Encounter Date: 07/11/2016      PT End of Session - 07/11/16 1507    Visit Number 2   Date for PT Re-Evaluation 09/08/16   PT Start Time 2979   PT Stop Time 1540   PT Time Calculation (min) 62 min   Activity Tolerance Patient tolerated treatment well   Behavior During Therapy [facility] for tasks assessed/performed      Past Medical History:  Diagnosis Date  . Abnormal nuclear cardiac imaging test 1/13   Abnormal stress test 05/2011, but cardiac cath 06/2011 showing normal coronary anatomy.  . Anemia   . Atrial flutter [facility])    s/p ablation in March 2012  . BPH (benign prostatic hyperplasia)   . Chronic anticoagulation    on Xarelto for afib  . DVT (deep venous thrombosis) ([place]) 2/12   in setting of R hip surgery, he developed R femoral DVT  . Hyperlipidemia   . Hypertension   . Obstructive sleep apnea    compliant with CPAP.  managed by Dr Elsworth Soho.  . Osteoarthritis   . Paroxysmal atrial fibrillation ([place]) 9/12   a. documented by Dr Barrett Shell on office EKG 9/12. b. maintained on Tikosyn and Xarelto. c. s/p DCCV 10/2012.  Marland Kitchen Peptic ulcer disease   . Prediabetes   . Premature ventricular contraction   . RBBB (right bundle branch block)   . Vitamin D deficiency     Past Surgical History:  Procedure Laterality Date  . ABLATION OF DYSRHYTHMIC FOCUS  08/29/2015  . atrial flutter ablation  5/12   by JA  . CARDIAC CATHETERIZATION    . CARDIOVERSION N/A 10/28/2012   Procedure: CARDIOVERSION;  Surgeon: Burnell Blanks, MD;  Location: [place];  Service: Cardiovascular;  Laterality: N/A;  . ELECTROPHYSIOLOGIC STUDY N/A 08/29/2015   Procedure: Atrial Fibrillation Ablation;  Surgeon: [name],  MD;  Location: [place] CV LAB;  Service: Cardiovascular;  Laterality: N/A;  . LEFT HEART CATHETERIZATION WITH CORONARY ANGIOGRAM N/A 06/07/2011   Procedure: LEFT HEART CATHETERIZATION WITH CORONARY ANGIOGRAM;  Surgeon: Peter M Martinique, MD;  Location: [facility] CATH LAB;  Service: Cardiovascular;  Laterality: N/A;  . TOTAL HIP ARTHROPLASTY  06/04/10   RIGHT HIP  . TOTAL KNEE ARTHROPLASTY    . TRANSURETHRAL RESECTION OF PROSTATE      There were no vitals filed for this visit.      Subjective Assessment - 07/11/16 1444    Subjective Patient reports that he thought the estim helped   Currently in Pain? Yes   Pain Score 2    Pain Location Neck   Pain Orientation Right                         [facility] Adult PT Treatment/Exercise - 07/11/16 0001      Exercises   Exercises Neck     Neck Exercises: Machines for Strengthening   UBE (Upper Arm Bike) Level 5 x 4 minutes   Cybex Row 20# 2x15   Other Machines for Strengthening lat pulls 25# 2x15     Neck Exercises: Theraband   Scapula Retraction 20 reps;Red   Shoulder Extension 20 reps;Red     Neck Exercises: Standing   Neck  Retraction 20 reps   Other Standing Exercises overhead weighted ball lifts     Modalities   Modalities Traction     Moist Heat Therapy   Number Minutes Moist Heat 15 Minutes   Moist Heat Location Cervical     Electrical Stimulation   Electrical Stimulation Location right cervical area   Electrical Stimulation Action IFC   Electrical Stimulation Parameters supine   Electrical Stimulation Goals Pain     Traction   Type of Traction Cervical   Min (lbs) 15   Hold Time static   Time 15                  PT Short Term Goals - 07/09/16 0959      PT SHORT TERM GOAL #1   Title independent with initial HEP   Time 2   Period Weeks   Status New           PT Long Term Goals - 07/09/16 1000      PT LONG TERM GOAL #1   Title understand posture and body mechanics   Time 8   Period  Weeks   Status New     PT LONG TERM GOAL #2   Title increase cervical ROM 25%   Time 8   Period Weeks   Status New     PT LONG TERM GOAL #3   Title report 25% easier to back up the car   Time 8   Period Weeks   Status New     PT LONG TERM GOAL #4   Title decrease pain 50%   Time 8   Period Weeks   Status New     PT LONG TERM GOAL #5   Title perform gym exercises safely and independently for scapula stabilization   Time 8   Period Weeks   Status New               Plan - 07/11/16 1508    Clinical Impression Statement Patient with fatigue doing exercises.  No increase of pain.  Some difficulty with cervical retraction, needing tactile and verbal cues   PT Next Visit Plan continue to add stabilization as tolerated, see if traction helped   Consulted and Agree with Plan of Care Patient      Patient will benefit from skilled therapeutic intervention in order to improve the following deficits and impairments:  Decreased range of motion, Increased muscle spasms, Impaired flexibility, Postural dysfunction, Improper body mechanics, Pain  Visit Diagnosis: Cervicalgia  Cramp and spasm     Problem List Patient Active Problem List   Diagnosis Date Noted  . Near syncope 04/20/2016  . Leg hematoma, left, initial encounter 04/20/2016  . Bleeding   . Ecchymosis   . Left hamstring muscle strain   . Muscle tear   . RBBB 08/30/2015  . A-fib ([place]) 08/29/2015  . Chronic systolic dysfunction of left ventricle 07/11/2014  . OSA (obstructive sleep apnea) 04/07/2013  . Multinodular goiter 01/20/2013  . Cervical spondylosis without myelopathy 01/18/2013  . Atrial fibrillation with RVR ([place]) 10/27/2012  . PAF (paroxysmal atrial fibrillation) ([place]) 05/28/2012  . Left ventricular dysfunction 12/17/2011  . Low back pain 09/11/2011  . Fatigue 05/22/2011  . PVC (premature ventricular contraction) 01/18/2011  . Persistent atrial fibrillation ([place]) 01/12/2011  . Hyperlipidemia  08/02/2010  . Hypertension 08/02/2010  . Osteoarthritis 08/02/2010  . BPH (benign prostatic hyperplasia) 08/02/2010  . GERD (gastroesophageal reflux disease) 08/02/2010  . h/o Atrial flutter ([place])  s/p ablation 2012     Sumner Boast., PT 07/11/2016, 3:10 PM  [place], [place], [address] Phone: [phone]   Fax:  [phone]  Name: Connor [name] MRN: [id number] Date of Birth: 03/14/1934

## 2016-07-15 ENCOUNTER — Encounter (HOSPITAL_COMMUNITY): Payer: Self-pay | Admitting: Nurse Practitioner

## 2016-07-15 ENCOUNTER — Ambulatory Visit (HOSPITAL_COMMUNITY)
Admission: RE | Admit: 2016-07-15 | Discharge: 2016-07-15 | Disposition: A | Discharge status: Home / Self Care | Payer: Medicare Other | Source: Ambulatory Visit | Attending: Nurse Practitioner | Admitting: Nurse Practitioner

## 2016-07-15 VITALS — BP 122/78 | HR 73 | Ht 71.75 in | Wt 229.4 lb

## 2016-07-15 DIAGNOSIS — E785 Hyperlipidemia, unspecified: Secondary | ICD-10-CM | POA: Diagnosis not present

## 2016-07-15 DIAGNOSIS — E559 Vitamin D deficiency, unspecified: Secondary | ICD-10-CM | POA: Insufficient documentation

## 2016-07-15 DIAGNOSIS — Z9889 Other specified postprocedural states: Secondary | ICD-10-CM | POA: Insufficient documentation

## 2016-07-15 DIAGNOSIS — Z7901 Long term (current) use of anticoagulants: Secondary | ICD-10-CM | POA: Diagnosis not present

## 2016-07-15 DIAGNOSIS — I10 Essential (primary) hypertension: Secondary | ICD-10-CM | POA: Diagnosis not present

## 2016-07-15 DIAGNOSIS — G4733 Obstructive sleep apnea (adult) (pediatric): Secondary | ICD-10-CM | POA: Diagnosis not present

## 2016-07-15 DIAGNOSIS — Z86718 Personal history of other venous thrombosis and embolism: Secondary | ICD-10-CM | POA: Insufficient documentation

## 2016-07-15 DIAGNOSIS — Z880 Allergy status to penicillin: Secondary | ICD-10-CM | POA: Diagnosis not present

## 2016-07-15 DIAGNOSIS — R7303 Prediabetes: Secondary | ICD-10-CM | POA: Insufficient documentation

## 2016-07-15 DIAGNOSIS — N400 Enlarged prostate without lower urinary tract symptoms: Secondary | ICD-10-CM | POA: Diagnosis not present

## 2016-07-15 DIAGNOSIS — Z87891 Personal history of nicotine dependence: Secondary | ICD-10-CM | POA: Diagnosis not present

## 2016-07-15 DIAGNOSIS — Z96651 Presence of right artificial knee joint: Secondary | ICD-10-CM | POA: Insufficient documentation

## 2016-07-15 DIAGNOSIS — I480 Paroxysmal atrial fibrillation: Secondary | ICD-10-CM | POA: Insufficient documentation

## 2016-07-15 DIAGNOSIS — Z96641 Presence of right artificial hip joint: Secondary | ICD-10-CM | POA: Insufficient documentation

## 2016-07-15 DIAGNOSIS — M1990 Unspecified osteoarthritis, unspecified site: Secondary | ICD-10-CM | POA: Diagnosis not present

## 2016-07-15 DIAGNOSIS — I4510 Unspecified right bundle-branch block: Secondary | ICD-10-CM | POA: Diagnosis not present

## 2016-07-15 DIAGNOSIS — I4892 Unspecified atrial flutter: Secondary | ICD-10-CM | POA: Insufficient documentation

## 2016-07-15 LAB — BASIC METABOLIC PANEL
Anion gap: 5 (ref 5–15)
BUN: 12 mg/dL (ref 6–20)
CO2: 25 mmol/L (ref 22–32)
CREATININE: 0.62 mg/dL (ref 0.61–1.24)
Calcium: 8.6 mg/dL — ABNORMAL LOW (ref 8.9–10.3)
Chloride: 110 mmol/L (ref 101–111)
GFR calc Af Amer: >60 mL/min (ref >60)
GLUCOSE: 112 mg/dL — AB (ref 65–99)
Potassium: 4.3 mmol/L (ref 3.5–5.1)
SODIUM: 140 mmol/L (ref 135–145)

## 2016-07-15 LAB — TSH: TSH: 1.264 u[IU]/mL (ref 0.350–4.500)

## 2016-07-15 LAB — MAGNESIUM: MAGNESIUM: 2.2 mg/dL (ref 1.7–2.4)

## 2016-07-15 MED ORDER — METOPROLOL SUCCINATE ER 50 MG PO TB24: ORAL_TABLET | ORAL | 2 refills | Status: DC

## 2016-07-15 NOTE — Progress Notes (Addendum)
Patient ID: Connor [name], male   DOB: 30-Aug-1933, 83 y.o.   MRN: [id number]     Primary Care Physician: Connor Snare, MD Referring Physician: Dr. [name] [name] is a 83 y.o. male with a h/o PAF that recently underwent repeat afib ablation 08/29/15 for f/u in the afib clinic. He reports just one epiosode of afib a few days after ablation. Otherwise staying in SR, very little afib. Continues on tikosyn/xarelto without issues.He did have a torn hamstring with bleeding into left leg which caused hypotension and near syncope and was admitted to [facility]. Xarelto was held x 4 days and has resolved.  He asked to be seen in  the afib clinic, 3/12, for wakening with afib this am. He took his am meds and now is in SR. He said this has happened 3-4 early mornings over the last month.He is using his cpap and wore the entire night last night and still woke up with afib. He is taking his tikosyn/xarelto as prescribed.  Today, he denies symptoms of palpitations, chest pain, shortness of breath, orthopnea, PND, lower extremity edema, dizziness, presyncope, syncope, or neurologic sequela. The patient is tolerating medications without difficulties and is otherwise without complaint today.   Past Medical History:  Diagnosis Date  . Abnormal nuclear cardiac imaging test 1/13   Abnormal stress test 05/2011, but cardiac cath 06/2011 showing normal coronary anatomy.  . Anemia   . Atrial flutter [facility])    s/p ablation in March 2012  . BPH (benign prostatic hyperplasia)   . Chronic anticoagulation    on Xarelto for afib  . DVT (deep venous thrombosis) ([place]) 2/12   in setting of R hip surgery, he developed R femoral DVT  . Hyperlipidemia   . Hypertension   . Obstructive sleep apnea    compliant with CPAP.  managed by Dr Elsworth Soho.  . Osteoarthritis   . Paroxysmal atrial fibrillation ([place]) 9/12   a. documented by Dr Barrett Shell on office EKG 9/12. b. maintained on Tikosyn and Xarelto. c. s/p DCCV 10/2012.  Marland Kitchen Peptic ulcer  disease   . Prediabetes   . Premature ventricular contraction   . RBBB (right bundle branch block)   . Vitamin D deficiency    Past Surgical History:  Procedure Laterality Date  . ABLATION OF DYSRHYTHMIC FOCUS  08/29/2015  . atrial flutter ablation  5/12   by JA  . CARDIAC CATHETERIZATION    . CARDIOVERSION N/A 10/28/2012   Procedure: CARDIOVERSION;  Surgeon: Burnell Blanks, MD;  Location: [place];  Service: Cardiovascular;  Laterality: N/A;  . ELECTROPHYSIOLOGIC STUDY N/A 08/29/2015   Procedure: Atrial Fibrillation Ablation;  Surgeon: [name], MD;  Location: [place] CV LAB;  Service: Cardiovascular;  Laterality: N/A;  . LEFT HEART CATHETERIZATION WITH CORONARY ANGIOGRAM N/A 06/07/2011   Procedure: LEFT HEART CATHETERIZATION WITH CORONARY ANGIOGRAM;  Surgeon: Peter M Martinique, MD;  Location: [facility] CATH LAB;  Service: Cardiovascular;  Laterality: N/A;  . TOTAL HIP ARTHROPLASTY  06/04/10   RIGHT HIP  . TOTAL KNEE ARTHROPLASTY    . TRANSURETHRAL RESECTION OF PROSTATE      Current Outpatient Prescriptions  Medication Sig Dispense Refill  . acetaminophen (TYLENOL) 500 MG tablet Take 1,000 mg by mouth 2 (two) times daily as needed for moderate pain.    Marland Kitchen atorvastatin (LIPITOR) 10 MG tablet TAKE 1 TABLET BY MOUTH EVERY DAY 90 tablet 1  . Cholecalciferol (VITAMIN D3) 5000 UNITS TABS Take 5,000 Units by mouth See admin instructions.  Take 5000 units by mouth every 4 days    . clindamycin (CLEOCIN T) 1 % external solution Apply 1 application topically daily as needed (for bumps in scalp). Apply to scalp daily as needed  2  . dofetilide (TIKOSYN) 500 MCG capsule TAKE 1 CAPSULE BY MOUTH EVERY 12 HOURS (Patient taking differently: Take 500 mcg by mouth every 12 (twelve) hours. ) 180 capsule 3  . KLOR-CON M20 20 MEQ tablet TAKE 2 TABLETS (40MEQ) BY MOUTH IN THE MORNING AND 2 TABLETS (40MEQ) IN THE EVENING. 360 tablet 0  . methocarbamol (ROBAXIN) 500 MG tablet Take 1 tablet (500 mg total) by  mouth at bedtime as needed (pain). 30 tablet 3  . metoprolol succinate (TOPROL-XL) 50 MG 24 hr tablet Take 1 tablet (50mg ) in the morning by mouth and 1/2 tablet (25mg )  at bedtime. 90 tablet 2  . metoprolol tartrate (LOPRESSOR) 25 MG tablet TAKE 1 TABLET BY MOUTH EVERY 6 HOURS AS NEEDED FOR FAST HEART RATES 30 tablet 6  . Multiple Vitamin (MULITIVITAMIN) LIQD Take 5 mLs by mouth daily.    . Multiple Vitamins-Minerals (ICAPS PO) Take 1 tablet by mouth every evening.     . Omega-3 Fatty Acids (OMEGA 3 PO) Take 3 capsules by mouth every evening.     Marland Kitchen omeprazole (PRILOSEC) 20 MG capsule Take 20 mg by mouth daily.     Marland Kitchen OVER THE COUNTER MEDICATION Place 3 drops into both eyes daily as needed (dry eyes). "thera tears"    . rivaroxaban (XARELTO) 20 MG TABS tablet Take 1 tablet (20 mg total) by mouth daily. 90 tablet 1  . tamsulosin (FLOMAX) 0.4 MG CAPS capsule TAKE ONE CAPSULE BY MOUTH ONCE DAILY 30 MINUTES AFTER SAME MEAL (Patient taking differently: TAKE 0.4 MG BY MOUTH ONCE DAILY 30 MINUTES AFTER SAME MEAL) 90 capsule 1  . traMADol (ULTRAM) 50 MG tablet Take 1 tablet (50 mg total) by mouth every 6 (six) hours as needed (pain). 60 tablet 5  . vitamin B-12 (CYANOCOBALAMIN) 500 MCG tablet Take 500 mcg by mouth at bedtime.      No current facility-administered medications for this encounter.     Allergies  Allergen Reactions  . Penicillins Itching, Swelling and Other (See Comments)    Has patient had a PCN reaction causing immediate rash, facial/tongue/throat swelling, SOB or lightheadedness with hypotension: Yes Has patient had a PCN reaction causing severe rash involving mucus membranes or skin necrosis: No Has patient had a PCN reaction that required hospitalization No Has patient had a PCN reaction occurring within the last 10 years: No If all of the above answers are "NO", then may proceed with Cephalosporin use.     Social History   Social History  . Marital status: Married    Spouse  name: N/A  . Number of children: N/A  . Years of education: N/A   Occupational History  . Not on file.   Social History Main Topics  . Smoking status: Former Smoker    Packs/day: 1.00    Years: 30.00    Types: Cigarettes    Quit date: 05/06/1978  . Smokeless tobacco: Never Used  . Alcohol use 3.0 oz/week    5 Cans of beer per week     Comment: he has just about quit  . Drug use: No  . Sexual activity: Not on file   Other Topics Concern  . Not on file   Social History Narrative   he patient lives in [place] with  his spouse.  Retired.    Family History  Problem Relation Age of Onset  . Heart attack Father   . Lung disease Mother   . Arrhythmia Sister   . Atrial fibrillation Brother   . Diabetes Neg Hx     ROS- All systems are reviewed and negative except as per the HPI above  Physical Exam: Vitals:   07/15/16 1053  BP: 122/78  Pulse: 73  Weight: 229 lb 6.4 oz (104.1 kg)  Height: 5' 11.75" (1.822 m)    GEN- The patient is well appearing, alert and oriented x 3 today.   Head- normocephalic, atraumatic Eyes-  Sclera clear, conjunctiva pink Ears- hearing intact Oropharynx- clear Neck- supple, no JVP Lymph- no cervical lymphadenopathy Lungs- Clear to ausculation bilaterally, normal work of breathing Heart- Regular rate and rhythm, no murmurs, rubs or gallops, PMI not laterally displaced GI- soft, NT, ND, + BS Extremities- no clubbing, cyanosis, or edema MS- no significant deformity or atrophy Skin- no rash or lesion Psych- euthymic mood, full affect Neuro- strength and sensation are intact  EKG- NSR with RBBB, pr int 172 ms, qrs int 142 ms, qtc 506 ms(qtc stable for pt with RBBB) Epic records reviewed  Assessment and Plan: 1. PAF Maintaining SR for most part but has noted afib on awakening in the am 3-4x in the last month Continue tikosyn,xarelto Contiue metoprolol SR 50 am and an extra  25 mg in pm Bmet/mag/tsh today Continue cpap  F/u in 2  weeks  F/u with Dr. Rayann Heman 4/30  afib clinic as needed

## 2016-07-15 NOTE — Addendum Note (Signed)
Encounter addended by: Sherran Needs, NP on: 07/15/2016  2:08 PM<BR>    Actions taken: Sign clinical note

## 2016-07-15 NOTE — Patient Instructions (Signed)
Your physician has recommended you make the following change in your medication:  1)Increase metoprolol to 50mg  in the morning and 25mg  (1/2 tablet of your 50mg  tablet) in the evening at bedtime.

## 2016-07-16 ENCOUNTER — Encounter: Payer: Self-pay | Admitting: Physical Therapy

## 2016-07-16 ENCOUNTER — Ambulatory Visit: Payer: Medicare Other | Admitting: Physical Therapy

## 2016-07-16 DIAGNOSIS — M542 Cervicalgia: Secondary | ICD-10-CM | POA: Diagnosis not present

## 2016-07-16 DIAGNOSIS — R252 Cramp and spasm: Secondary | ICD-10-CM

## 2016-07-16 NOTE — Therapy (Signed)
[place], [place], [address] Phone: [phone]   Fax:  [phone]  Physical Therapy Treatment  Patient Details  Name: Connor [name] MRN: [id number] Date of Birth: 03/21/1934 Referring Provider: Ruffin Frederick  Encounter Date: 07/16/2016      PT End of Session - 07/16/16 1559    Visit Number 3   Date for PT Re-Evaluation 09/08/16   PT Start Time 1522   PT Stop Time 1620   PT Time Calculation (min) 58 min   Activity Tolerance Patient tolerated treatment well   Behavior During Therapy [facility] for tasks assessed/performed      Past Medical History:  Diagnosis Date  . Abnormal nuclear cardiac imaging test 1/13   Abnormal stress test 05/2011, but cardiac cath 06/2011 showing normal coronary anatomy.  . Anemia   . Atrial flutter [facility])    s/p ablation in March 2012  . BPH (benign prostatic hyperplasia)   . Chronic anticoagulation    on Xarelto for afib  . DVT (deep venous thrombosis) ([place]) 2/12   in setting of R hip surgery, he developed R femoral DVT  . Hyperlipidemia   . Hypertension   . Obstructive sleep apnea    compliant with CPAP.  managed by Dr Elsworth Soho.  . Osteoarthritis   . Paroxysmal atrial fibrillation ([place]) 9/12   a. documented by Dr Barrett Shell on office EKG 9/12. b. maintained on Tikosyn and Xarelto. c. s/p DCCV 10/2012.  Marland Kitchen Peptic ulcer disease   . Prediabetes   . Premature ventricular contraction   . RBBB (right bundle branch block)   . Vitamin D deficiency     Past Surgical History:  Procedure Laterality Date  . ABLATION OF DYSRHYTHMIC FOCUS  08/29/2015  . atrial flutter ablation  5/12   by JA  . CARDIAC CATHETERIZATION    . CARDIOVERSION N/A 10/28/2012   Procedure: CARDIOVERSION;  Surgeon: Burnell Blanks, MD;  Location: [place];  Service: Cardiovascular;  Laterality: N/A;  . ELECTROPHYSIOLOGIC STUDY N/A 08/29/2015   Procedure: Atrial Fibrillation Ablation;  Surgeon: [name], MD;  Location: [place] CV LAB;  Service: Cardiovascular;  Laterality: N/A;  . LEFT HEART CATHETERIZATION WITH CORONARY ANGIOGRAM N/A 06/07/2011   Procedure: LEFT HEART CATHETERIZATION WITH CORONARY ANGIOGRAM;  Surgeon: Peter M Martinique, MD;  Location: [facility] CATH LAB;  Service: Cardiovascular;  Laterality: N/A;  . TOTAL HIP ARTHROPLASTY  06/04/10   RIGHT HIP  . TOTAL KNEE ARTHROPLASTY    . TRANSURETHRAL RESECTION OF PROSTATE      There were no vitals filed for this visit.      Subjective Assessment - 07/16/16 1522    Subjective I think it is helping some.   Currently in Pain? Yes   Pain Score 1    Pain Location Neck   Pain Orientation Right   Aggravating Factors  sitting   Pain Relieving Factors the treatment                         [facility] Adult PT Treatment/Exercise - 07/16/16 0001      Neck Exercises: Machines for Strengthening   UBE (Upper Arm Bike) Level 5 x 4 minutes   Cybex Row 20# 2x15   Other Machines for Strengthening lat pulls 25# 2x15     Neck Exercises: Theraband   Scapula Retraction 20 reps;Red   Scapula Retraction Limitations tehn did this with the pulley system 10#  Shoulder Extension 20 reps;Red   Shoulder Extension Limitations then with pulley system 10#      Neck Exercises: Standing   Neck Retraction 20 reps   Other Standing Exercises overhead weighted ball lifts     Modalities   Modalities Electrical Stimulation;Moist Heat;Traction     Moist Heat Therapy   Number Minutes Moist Heat 15 Minutes   Moist Heat Location Cervical     Electrical Stimulation   Electrical Stimulation Location right cervical area   Electrical Stimulation Action IFC   Electrical Stimulation Parameters supine   Electrical Stimulation Goals Pain     Traction   Type of Traction Cervical   Min (lbs) 15   Hold Time static   Time 15                  PT Short Term Goals - 07/16/16 1601      PT SHORT TERM GOAL #1   Title independent with  initial HEP   Status Achieved           PT Long Term Goals - 07/09/16 1000      PT LONG TERM GOAL #1   Title understand posture and body mechanics   Time 8   Period Weeks   Status New     PT LONG TERM GOAL #2   Title increase cervical ROM 25%   Time 8   Period Weeks   Status New     PT LONG TERM GOAL #3   Title report 25% easier to back up the car   Time 8   Period Weeks   Status New     PT LONG TERM GOAL #4   Title decrease pain 50%   Time 8   Period Weeks   Status New     PT LONG TERM GOAL #5   Title perform gym exercises safely and independently for scapula stabilization   Time 8   Period Weeks   Status New               Plan - 07/16/16 1600    Clinical Impression Statement The upper back and the RC mms are weak.  Reports that the cervical traction feels good.  He reports that he is trying to do some of the things we are doing here at the gym   PT Next Visit Plan continue to add stabilization as tolerated   Consulted and Agree with Plan of Care Patient      Patient will benefit from skilled therapeutic intervention in order to improve the following deficits and impairments:  Decreased range of motion, Increased muscle spasms, Impaired flexibility, Postural dysfunction, Improper body mechanics, Pain  Visit Diagnosis: Cervicalgia  Cramp and spasm     Problem List Patient Active Problem List   Diagnosis Date Noted  . Near syncope 04/20/2016  . Leg hematoma, left, initial encounter 04/20/2016  . Bleeding   . Ecchymosis   . Left hamstring muscle strain   . Muscle tear   . RBBB 08/30/2015  . A-fib ([place]) 08/29/2015  . Chronic systolic dysfunction of left ventricle 07/11/2014  . OSA (obstructive sleep apnea) 04/07/2013  . Multinodular goiter 01/20/2013  . Cervical spondylosis without myelopathy 01/18/2013  . Atrial fibrillation with RVR ([place]) 10/27/2012  . PAF (paroxysmal atrial fibrillation) ([place]) 05/28/2012  . Left ventricular  dysfunction 12/17/2011  . Low back pain 09/11/2011  . Fatigue 05/22/2011  . PVC (premature ventricular contraction) 01/18/2011  . Persistent atrial fibrillation ([place]) 01/12/2011  .  Hyperlipidemia 08/02/2010  . Hypertension 08/02/2010  . Osteoarthritis 08/02/2010  . BPH (benign prostatic hyperplasia) 08/02/2010  . GERD (gastroesophageal reflux disease) 08/02/2010  . h/o Atrial flutter [facility]) s/p ablation 2012     Kiarra Kidd W., PT 07/16/2016, 4:01 PM  [place], [place], [address] Phone: [phone]   Fax:  [phone]  Name: Connor [name] MRN: [id number] Date of Birth: 07/26/1933

## 2016-07-18 ENCOUNTER — Ambulatory Visit: Payer: Medicare Other | Admitting: Physical Therapy

## 2016-07-18 ENCOUNTER — Encounter: Payer: Self-pay | Admitting: Physical Therapy

## 2016-07-18 DIAGNOSIS — M542 Cervicalgia: Secondary | ICD-10-CM

## 2016-07-18 DIAGNOSIS — R252 Cramp and spasm: Secondary | ICD-10-CM

## 2016-07-18 NOTE — Therapy (Signed)
[address] [place], [place], [address] Phone: [phone]   Fax:  [phone]  Physical Therapy Treatment  Patient Details  Name: Connor [name] MRN: [id number] Date of Birth: 06-17-1933 Referring Provider: Ruffin Frederick  Encounter Date: 07/18/2016      PT End of Session - 07/18/16 1024    Visit Number 4   Date for PT Re-Evaluation 09/08/16   PT Start Time 1003   PT Stop Time 1100   PT Time Calculation (min) 57 min      Past Medical History:  Diagnosis Date  . Abnormal nuclear cardiac imaging test 1/13   Abnormal stress test 05/2011, but cardiac cath 06/2011 showing normal coronary anatomy.  . Anemia   . Atrial flutter [facility])    s/p ablation in March 2012  . BPH (benign prostatic hyperplasia)   . Chronic anticoagulation    on Xarelto for afib  . DVT (deep venous thrombosis) ([place]) 2/12   in setting of R hip surgery, he developed R femoral DVT  . Hyperlipidemia   . Hypertension   . Obstructive sleep apnea    compliant with CPAP.  managed by Dr Elsworth Soho.  . Osteoarthritis   . Paroxysmal atrial fibrillation ([place]) 9/12   a. documented by Dr Barrett Shell on office EKG 9/12. b. maintained on Tikosyn and Xarelto. c. s/p DCCV 10/2012.  Marland Kitchen Peptic ulcer disease   . Prediabetes   . Premature ventricular contraction   . RBBB (right bundle branch block)   . Vitamin D deficiency     Past Surgical History:  Procedure Laterality Date  . ABLATION OF DYSRHYTHMIC FOCUS  08/29/2015  . atrial flutter ablation  5/12   by JA  . CARDIAC CATHETERIZATION    . CARDIOVERSION N/A 10/28/2012   Procedure: CARDIOVERSION;  Surgeon: Burnell Blanks, MD;  Location: [place];  Service: Cardiovascular;  Laterality: N/A;  . ELECTROPHYSIOLOGIC STUDY N/A 08/29/2015   Procedure: Atrial Fibrillation Ablation;  Surgeon: [name], MD;  Location: [place] CV LAB;  Service: Cardiovascular;  Laterality: N/A;  . LEFT HEART CATHETERIZATION  WITH CORONARY ANGIOGRAM N/A 06/07/2011   Procedure: LEFT HEART CATHETERIZATION WITH CORONARY ANGIOGRAM;  Surgeon: Peter M Martinique, MD;  Location: [facility] CATH LAB;  Service: Cardiovascular;  Laterality: N/A;  . TOTAL HIP ARTHROPLASTY  06/04/10   RIGHT HIP  . TOTAL KNEE ARTHROPLASTY    . TRANSURETHRAL RESECTION OF PROSTATE      There were no vitals filed for this visit.      Subjective Assessment - 07/18/16 1007    Subjective dizzy this morning   Currently in Pain? Yes   Pain Score 2    Pain Location Neck   Pain Orientation Right            [facility] PT Assessment - 07/18/16 0001      AROM   Overall AROM Comments cerv flex WFLs, ext decreased 25% wih RT dev, RT rot decreased 50% with pain and left rotation decreased 25%                     [facility] Adult PT Treatment/Exercise - 07/18/16 0001      Neck Exercises: Machines for Strengthening   UBE (Upper Arm Bike) L 3 3 fwd/3 back   Cybex Row 25# 2 sets 15   Other Machines for Strengthening lat pulls 25# 2x15     Neck Exercises: Theraband   Scapula Retraction 15 reps  3 ways with 10# pulleys     Neck Exercises: Standing   Neck Retraction 20 reps  head on ball on wall   Other Standing Exercises standing snow angels 10 times for posture  2#     Modalities   Modalities Electrical Stimulation;Moist Heat;Traction     Moist Heat Therapy   Number Minutes Moist Heat 15 Minutes   Moist Heat Location Cervical     Electrical Stimulation   Electrical Stimulation Location right cervical area   Electrical Stimulation Action IFC   Electrical Stimulation Parameters supine   Electrical Stimulation Goals Pain     Traction   Type of Traction Cervical   Min (lbs) 15   Hold Time static   Time 15                  PT Short Term Goals - 07/16/16 1601      PT SHORT TERM GOAL #1   Title independent with initial HEP   Status Achieved           PT Long Term Goals - 07/18/16 1011      PT LONG TERM GOAL #1    Title understand posture and body mechanics   Status On-going     PT LONG TERM GOAL #2   Title increase cervical ROM 25%   Status On-going     PT LONG TERM GOAL #3   Title report 25% easier to back up the car   Status On-going     PT LONG TERM GOAL #4   Title decrease pain 50%   Status Partially Met     PT LONG TERM GOAL #5   Title perform gym exercises safely and independently for scapula stabilization   Status On-going               Plan - 07/18/16 1024    Clinical Impression Statement pt progressing with cerv ROM and all goals. Pt reports decreased pain but still feels dizziness, but reports tx helping esp modalities. Pt with RT dev with cerv AROM   PT Next Visit Plan cerv stab ex . may try manual tech to neck for RT dev and increase ROM      Patient will benefit from skilled therapeutic intervention in order to improve the following deficits and impairments:  Decreased range of motion, Increased muscle spasms, Impaired flexibility, Postural dysfunction, Improper body mechanics, Pain  Visit Diagnosis: Cervicalgia  Cramp and spasm     Problem List Patient Active Problem List   Diagnosis Date Noted  . Near syncope 04/20/2016  . Leg hematoma, left, initial encounter 04/20/2016  . Bleeding   . Ecchymosis   . Left hamstring muscle strain   . Muscle tear   . RBBB 08/30/2015  . A-fib ([place]) 08/29/2015  . Chronic systolic dysfunction of left ventricle 07/11/2014  . OSA (obstructive sleep apnea) 04/07/2013  . Multinodular goiter 01/20/2013  . Cervical spondylosis without myelopathy 01/18/2013  . Atrial fibrillation with RVR ([place]) 10/27/2012  . PAF (paroxysmal atrial fibrillation) ([place]) 05/28/2012  . Left ventricular dysfunction 12/17/2011  . Low back pain 09/11/2011  . Fatigue 05/22/2011  . PVC (premature ventricular contraction) 01/18/2011  . Persistent atrial fibrillation ([place]) 01/12/2011  . Hyperlipidemia 08/02/2010  . Hypertension 08/02/2010  .  Osteoarthritis 08/02/2010  . BPH (benign prostatic hyperplasia) 08/02/2010  . GERD (gastroesophageal reflux disease) 08/02/2010  . h/o Atrial flutter ([place]) s/p ablation 2012     Aliviya Schoeller,ANGIE PTA 07/18/2016, 10:26 AM  [address]  [address] [place], [place], [address] Phone: [phone]   Fax:  [phone]  Name: Connor [name] MRN: [id number] Date of Birth: 06/14/1933

## 2016-07-19 ENCOUNTER — Telehealth: Payer: Self-pay | Admitting: Pulmonary Disease

## 2016-07-19 DIAGNOSIS — G4733 Obstructive sleep apnea (adult) (pediatric): Secondary | ICD-10-CM

## 2016-07-19 NOTE — Telephone Encounter (Signed)
Spoke with pt. He is wanting to switch DMEs. States that he is dissatisfied with [employer] and wants to be switched to [facility]. Order has been placed. Nothing further was needed.

## 2016-07-22 ENCOUNTER — Encounter: Payer: Self-pay | Admitting: Physical Therapy

## 2016-07-22 ENCOUNTER — Ambulatory Visit: Payer: Medicare Other | Admitting: Physical Therapy

## 2016-07-22 DIAGNOSIS — M542 Cervicalgia: Secondary | ICD-10-CM

## 2016-07-22 DIAGNOSIS — R252 Cramp and spasm: Secondary | ICD-10-CM

## 2016-07-22 NOTE — Therapy (Signed)
[place] [address] [place], [place], [address] Phone: [phone]   Fax:  [phone]  Physical Therapy Treatment  Patient Details  Name: Connor [name] MRN: [id number] Date of Birth: [birth year] Referring Provider: Ruffin Frederick  Encounter Date: 07/22/2016      PT End of Session - 07/22/16 1051    Visit Number 5   Date for PT Re-Evaluation 09/08/16   PT Start Time 1011   PT Stop Time 1115   PT Time Calculation (min) 64 min   Activity Tolerance Patient tolerated treatment well   Behavior During Therapy [facility] for tasks assessed/performed      Past Medical History:  Diagnosis Date  . Abnormal nuclear cardiac imaging test 1/13   Abnormal stress test 05/2011, but cardiac cath 06/2011 showing normal coronary anatomy.  . Anemia   . Atrial flutter [facility])    s/p ablation in March 2012  . BPH (benign prostatic hyperplasia)   . Chronic anticoagulation    on Xarelto for afib  . DVT (deep venous thrombosis) ([place]) 2/12   in setting of R hip surgery, he developed R femoral DVT  . Hyperlipidemia   . Hypertension   . Obstructive sleep apnea    compliant with CPAP.  managed by Dr Elsworth Soho.  . Osteoarthritis   . Paroxysmal atrial fibrillation ([place]) 9/12   a. documented by Dr Barrett Shell on office EKG 9/12. b. maintained on Tikosyn and Xarelto. c. s/p DCCV 10/2012.  Marland Kitchen Peptic ulcer disease   . Prediabetes   . Premature ventricular contraction   . RBBB (right bundle branch block)   . Vitamin D deficiency     Past Surgical History:  Procedure Laterality Date  . ABLATION OF DYSRHYTHMIC FOCUS  08/29/2015  . atrial flutter ablation  5/12   by JA  . CARDIAC CATHETERIZATION    . CARDIOVERSION N/A 10/28/2012   Procedure: CARDIOVERSION;  Surgeon: Burnell Blanks, MD;  Location: [place];  Service: Cardiovascular;  Laterality: N/A;  . ELECTROPHYSIOLOGIC STUDY N/A 08/29/2015   Procedure: Atrial Fibrillation Ablation;  Surgeon: [name], MD;  Location: [place] CV LAB;  Service: Cardiovascular;  Laterality: N/A;  . LEFT HEART CATHETERIZATION WITH CORONARY ANGIOGRAM N/A 06/07/2011   Procedure: LEFT HEART CATHETERIZATION WITH CORONARY ANGIOGRAM;  Surgeon: Peter M Martinique, MD;  [facility] CATH LAB;  Service: Cardiovascular;  Laterality: N/A;  . TOTAL HIP ARTHROPLASTY  06/04/10   RIGHT HIP  . TOTAL KNEE ARTHROPLASTY    . TRANSURETHRAL RESECTION OF PROSTATE      There were no vitals filed for this visit.      Subjective Assessment - 07/22/16 1020    Subjective Reports that he had a bad day on Friday and is unsure of why   Currently in Pain? Yes   Pain Score 2    Pain Location Neck   Pain Orientation Right                         [facility] Adult PT Treatment/Exercise - 07/22/16 0001      Neck Exercises: Machines for Strengthening   UBE (Upper Arm Bike) L 3 3 fwd/3 back   Cybex Row 25# 2 sets 15   Other Machines for Strengthening lat pulls 25# 2x15   Other Machines for Strengthening scapular stabilization on the pulleys with 10# 2 ways     Neck Exercises: Theraband   Shoulder External Rotation 20 reps;Red  Shoulder External Rotation Limitations arms at 90 degrees abduction     Neck Exercises: Standing   Neck Retraction 20 reps   Other Standing Exercises standing snow angels 10 times for posture     Moist Heat Therapy   Number Minutes Moist Heat 15 Minutes   Moist Heat Location Cervical     Electrical Stimulation   Electrical Stimulation Location right cervical area   Electrical Stimulation Action IFC   Electrical Stimulation Parameters supine   Electrical Stimulation Goals Pain     Traction   Type of Traction Cervical   Min (lbs) 17   Hold Time static   Time 15     Manual Therapy   Manual Therapy Soft tissue mobilization   Soft tissue mobilization use of vibration for STM to the right upper trap, and cervical parapsinals                  PT Short Term Goals -  07/16/16 1601      PT SHORT TERM GOAL #1   Title independent with initial HEP   Status Achieved           PT Long Term Goals - 07/18/16 1011      PT LONG TERM GOAL #1   Title understand posture and body mechanics   Status On-going     PT LONG TERM GOAL #2   Title increase cervical ROM 25%   Status On-going     PT LONG TERM GOAL #3   Title report 25% easier to back up the car   Status On-going     PT LONG TERM GOAL #4   Title decrease pain 50%   Status Partially Met     PT LONG TERM GOAL #5   Title perform gym exercises safely and independently for scapula stabilization   Status On-going               Plan - 07/22/16 1051    Clinical Impression Statement Has a significant knot and tenderness in the right upper trap and cervical area   PT Next Visit Plan could try more aggressive STM   Consulted and Agree with Plan of Care Patient      Patient will benefit from skilled therapeutic intervention in order to improve the following deficits and impairments:  Decreased range of motion, Increased muscle spasms, Impaired flexibility, Postural dysfunction, Improper body mechanics, Pain  Visit Diagnosis: Cervicalgia  Cramp and spasm     Problem List Patient Active Problem List   Diagnosis Date Noted  . Near syncope 04/20/2016  . Leg hematoma, left, initial encounter 04/20/2016  . Bleeding   . Ecchymosis   . Left hamstring muscle strain   . Muscle tear   . RBBB 08/30/2015  . A-fib ([place]) 08/29/2015  . Chronic systolic dysfunction of left ventricle 07/11/2014  . OSA (obstructive sleep apnea) 04/07/2013  . Multinodular goiter 01/20/2013  . Cervical spondylosis without myelopathy 01/18/2013  . Atrial fibrillation with RVR ([place]) 10/27/2012  . PAF (paroxysmal atrial fibrillation) ([place]) 05/28/2012  . Left ventricular dysfunction 12/17/2011  . Low back pain 09/11/2011  . Fatigue 05/22/2011  . PVC (premature ventricular contraction) 01/18/2011  . Persistent  atrial fibrillation ([place]) 01/12/2011  . Hyperlipidemia 08/02/2010  . Hypertension 08/02/2010  . Osteoarthritis 08/02/2010  . BPH (benign prostatic hyperplasia) 08/02/2010  . GERD (gastroesophageal reflux disease) 08/02/2010  . h/o Atrial flutter ([place]) s/p ablation 2012     Tanaisha Pittman W., PT 07/22/2016, 10:53 AM  [facility]  [address] [place], [place], [address] Phone: [phone]   Fax:  [phone]  Name: Connor [name] MRN: [id number] Date of Birth: 05/02/1934

## 2016-07-25 ENCOUNTER — Telehealth: Payer: Self-pay

## 2016-07-25 ENCOUNTER — Ambulatory Visit: Payer: Medicare Other | Admitting: Physical Therapy

## 2016-07-25 ENCOUNTER — Encounter: Payer: Self-pay | Admitting: Physical Therapy

## 2016-07-25 DIAGNOSIS — R252 Cramp and spasm: Secondary | ICD-10-CM

## 2016-07-25 DIAGNOSIS — M542 Cervicalgia: Secondary | ICD-10-CM

## 2016-07-25 NOTE — Therapy (Signed)
[address] [place], [place], [address] Phone: [phone]   Fax:  [phone]  Physical Therapy Treatment  Patient Details  Name: Connor [name] MRN: [id number] Date of Birth: 1933/06/09 Referring Provider: Ruffin Frederick  Encounter Date: 07/25/2016      PT End of Session - 07/25/16 1054    Visit Number 6   Date for PT Re-Evaluation 09/08/16   PT Start Time 1010   PT Stop Time 1118   PT Time Calculation (min) 68 min   Activity Tolerance Patient tolerated treatment well   Behavior During Therapy [facility] for tasks assessed/performed      Past Medical History:  Diagnosis Date  . Abnormal nuclear cardiac imaging test 1/13   Abnormal stress test 05/2011, but cardiac cath 06/2011 showing normal coronary anatomy.  . Anemia   . Atrial flutter [facility])    s/p ablation in March 2012  . BPH (benign prostatic hyperplasia)   . Chronic anticoagulation    on Xarelto for afib  . DVT (deep venous thrombosis) ([place]) 2/12   in setting of R hip surgery, he developed R femoral DVT  . Hyperlipidemia   . Hypertension   . Obstructive sleep apnea    compliant with CPAP.  managed by Dr Elsworth Soho.  . Osteoarthritis   . Paroxysmal atrial fibrillation ([place]) 9/12   a. documented by Dr Barrett Shell on office EKG 9/12. b. maintained on Tikosyn and Xarelto. c. s/p DCCV 10/2012.  Marland Kitchen Peptic ulcer disease   . Prediabetes   . Premature ventricular contraction   . RBBB (right bundle branch block)   . Vitamin D deficiency     Past Surgical History:  Procedure Laterality Date  . ABLATION OF DYSRHYTHMIC FOCUS  08/29/2015  . atrial flutter ablation  5/12   by JA  . CARDIAC CATHETERIZATION    . CARDIOVERSION N/A 10/28/2012   Procedure: CARDIOVERSION;  Surgeon: Burnell Blanks, MD;  Location: [place];  Service: Cardiovascular;  Laterality: N/A;  . ELECTROPHYSIOLOGIC STUDY N/A 08/29/2015   Procedure: Atrial Fibrillation Ablation;  Surgeon: [name], MD;  Location: [place] CV LAB;  Service: Cardiovascular;  Laterality: N/A;  . LEFT HEART CATHETERIZATION WITH CORONARY ANGIOGRAM N/A 06/07/2011   Procedure: LEFT HEART CATHETERIZATION WITH CORONARY ANGIOGRAM;  Surgeon: Peter M Martinique, MD;  Location: [facility] CATH LAB;  Service: Cardiovascular;  Laterality: N/A;  . TOTAL HIP ARTHROPLASTY  06/04/10   RIGHT HIP  . TOTAL KNEE ARTHROPLASTY    . TRANSURETHRAL RESECTION OF PROSTATE      There were no vitals filed for this visit.      Subjective Assessment - 07/25/16 1011    Subjective I think I felt quite a bit better.   Currently in Pain? Yes   Pain Score 2    Pain Location Neck   Pain Orientation Right   Pain Relieving Factors treatment does help                         [facility] Adult PT Treatment/Exercise - 07/25/16 0001      Neck Exercises: Machines for Strengthening   UBE (Upper Arm Bike) L 5 3 fwd/3 back   Cybex Row 25# 2 sets 15   Other Machines for Strengthening lat pulls 25# 2x15   Other Machines for Strengthening scapular stabilization on the pulleys with 10# 2 ways     Neck Exercises: Theraband   Shoulder External Rotation 20  reps;Red   Shoulder External Rotation Limitations arms at 90 degrees abduction   Other Theraband Exercises PNF patterns with red tband     Neck Exercises: Standing   Other Standing Exercises standing snow angels 10 times for posture   Other Standing Exercises wand exercises for shoulder stretches, rhythmic stabilization with 6# ball     Neck Exercises: Supine   Other Supine Exercise head on ball cervivcal retractions     Moist Heat Therapy   Number Minutes Moist Heat 15 Minutes   Moist Heat Location Cervical     Electrical Stimulation   Electrical Stimulation Location right cervical area   Electrical Stimulation Action IFC   Electrical Stimulation Parameters supine   Electrical Stimulation Goals Pain     Traction   Type of Traction Cervical   Min (lbs) 17   Hold Time  static   Time 15     Manual Therapy   Manual Therapy Soft tissue mobilization   Soft tissue mobilization use of vibration for STM to the right upper trap, and cervical parapsinals                  PT Short Term Goals - 07/16/16 1601      PT SHORT TERM GOAL #1   Title independent with initial HEP   Status Achieved           PT Long Term Goals - 07/25/16 1137      PT LONG TERM GOAL #1   Title understand posture and body mechanics   Status Achieved               Plan - 07/25/16 1136    Clinical Impression Statement Reports that he feel that he is getting better, less stiffness per him iwth ADL's visually he is still limited with his ROM.  Still very tight and tender i the right upper trap and cervical area   PT Next Visit Plan Continue to work on strength and stability   Consulted and Agree with Plan of Care Patient      Patient will benefit from skilled therapeutic intervention in order to improve the following deficits and impairments:  Decreased range of motion, Increased muscle spasms, Impaired flexibility, Postural dysfunction, Improper body mechanics, Pain  Visit Diagnosis: Cervicalgia  Cramp and spasm     Problem List Patient Active Problem List   Diagnosis Date Noted  . Near syncope 04/20/2016  . Leg hematoma, left, initial encounter 04/20/2016  . Bleeding   . Ecchymosis   . Left hamstring muscle strain   . Muscle tear   . RBBB 08/30/2015  . A-fib ([place]) 08/29/2015  . Chronic systolic dysfunction of left ventricle 07/11/2014  . OSA (obstructive sleep apnea) 04/07/2013  . Multinodular goiter 01/20/2013  . Cervical spondylosis without myelopathy 01/18/2013  . Atrial fibrillation with RVR ([place]) 10/27/2012  . PAF (paroxysmal atrial fibrillation) ([place]) 05/28/2012  . Left ventricular dysfunction 12/17/2011  . Low back pain 09/11/2011  . Fatigue 05/22/2011  . PVC (premature ventricular contraction) 01/18/2011  . Persistent atrial  fibrillation ([place]) 01/12/2011  . Hyperlipidemia 08/02/2010  . Hypertension 08/02/2010  . Osteoarthritis 08/02/2010  . BPH (benign prostatic hyperplasia) 08/02/2010  . GERD (gastroesophageal reflux disease) 08/02/2010  . h/o Atrial flutter [facility]) s/p ablation 2012     Sumner Boast., PT 07/25/2016, 11:38 AM  [place] [address] [place], [place], [address] Phone: [phone]   Fax:  [phone]  Name:  MALON BRANTON MRN: [id number] Date of Birth: [birth year]

## 2016-07-25 NOTE — Telephone Encounter (Signed)
**Note De-Identified Anzlee Hinesley Obfuscation** The pt is advised that we received a fax from [facility] stating that they approved his tier exception from [employer]. Auth # [id number] valid 06/21/16 through 05/05/17.  He verbalized understanding and thanked [place] for our assistance.

## 2016-07-29 ENCOUNTER — Ambulatory Visit: Payer: Medicare Other | Admitting: Physical Therapy

## 2016-07-29 ENCOUNTER — Encounter: Payer: Self-pay | Admitting: Physical Therapy

## 2016-07-29 DIAGNOSIS — M542 Cervicalgia: Secondary | ICD-10-CM | POA: Diagnosis not present

## 2016-07-29 DIAGNOSIS — R252 Cramp and spasm: Secondary | ICD-10-CM

## 2016-07-29 NOTE — Therapy (Signed)
[place], [place], [address] Phone: [phone]   Fax:  [phone]  Physical Therapy Treatment  Patient Details  Name: Connor [name] MRN: [id number] Date of Birth: 1933-06-20 Referring Provider: Ruffin Frederick  Encounter Date: 07/29/2016      PT End of Session - 07/29/16 1134    Visit Number 7   Date for PT Re-Evaluation 09/08/16   PT Start Time 1100   PT Stop Time 1200   PT Time Calculation (min) 60 min   Activity Tolerance Patient tolerated treatment well   Behavior During Therapy [facility] for tasks assessed/performed      Past Medical History:  Diagnosis Date  . Abnormal nuclear cardiac imaging test 1/13   Abnormal stress test 05/2011, but cardiac cath 06/2011 showing normal coronary anatomy.  . Anemia   . Atrial flutter [facility])    s/p ablation in March 2012  . BPH (benign prostatic hyperplasia)   . Chronic anticoagulation    on Xarelto for afib  . DVT (deep venous thrombosis) ([place]) 2/12   in setting of R hip surgery, he developed R femoral DVT  . Hyperlipidemia   . Hypertension   . Obstructive sleep apnea    compliant with CPAP.  managed by Dr Elsworth Soho.  . Osteoarthritis   . Paroxysmal atrial fibrillation ([place]) 9/12   a. documented by Dr Barrett Shell on office EKG 9/12. b. maintained on Tikosyn and Xarelto. c. s/p DCCV 10/2012.  Marland Kitchen Peptic ulcer disease   . Prediabetes   . Premature ventricular contraction   . RBBB (right bundle branch block)   . Vitamin D deficiency     Past Surgical History:  Procedure Laterality Date  . ABLATION OF DYSRHYTHMIC FOCUS  08/29/2015  . atrial flutter ablation  5/12   by JA  . CARDIAC CATHETERIZATION    . CARDIOVERSION N/A 10/28/2012   Procedure: CARDIOVERSION;  Surgeon: Burnell Blanks, MD;  Location: [place];  Service: Cardiovascular;  Laterality: N/A;  . ELECTROPHYSIOLOGIC STUDY N/A 08/29/2015   Procedure: Atrial Fibrillation Ablation;  Surgeon: [name], MD;  Location: [place] CV LAB;  Service: Cardiovascular;  Laterality: N/A;  . LEFT HEART CATHETERIZATION WITH CORONARY ANGIOGRAM N/A 06/07/2011   Procedure: LEFT HEART CATHETERIZATION WITH CORONARY ANGIOGRAM;  Surgeon: Peter M Martinique, MD;  Location: [facility] CATH LAB;  Service: Cardiovascular;  Laterality: N/A;  . TOTAL HIP ARTHROPLASTY  06/04/10   RIGHT HIP  . TOTAL KNEE ARTHROPLASTY    . TRANSURETHRAL RESECTION OF PROSTATE      There were no vitals filed for this visit.      Subjective Assessment - 07/29/16 1102    Subjective "Only a little soreness in my neck"   Currently in Pain? Yes   Pain Score 2    Pain Location Neck                         [facility] Adult PT Treatment/Exercise - 07/29/16 0001      Neck Exercises: Machines for Strengthening   UBE (Upper Arm Bike) L 5 3 fwd/3 back   Cybex Row 25# 2 sets 15   Other Machines for Strengthening lat pulls 25# 2x15   Other Machines for Strengthening scapular stabilization on the pulleys with 10# 2 ways     Neck Exercises: Theraband   Shoulder External Rotation 15 reps  x2, yellow   Horizontal ADduction 20 reps;Red   Other Theraband  Exercises PNF patterns with red tband     Moist Heat Therapy   Number Minutes Moist Heat 15 Minutes   Moist Heat Location Cervical     Electrical Stimulation   Electrical Stimulation Location right cervical area   Electrical Stimulation Action IFC   Electrical Stimulation Parameters supine     Traction   Type of Traction Cervical   Min (lbs) 17   Hold Time static   Time 15     Manual Therapy   Manual Therapy Soft tissue mobilization   Soft tissue mobilization use of vibration for STM to the right upper trap, and cervical paraspinals                  PT Short Term Goals - 07/16/16 1601      PT SHORT TERM GOAL #1   Title independent with initial HEP   Status Achieved           PT Long Term Goals - 07/29/16 1134      PT LONG TERM GOAL #1   Title  understand posture and body mechanics   Status Achieved     PT LONG TERM GOAL #2   Title increase cervical ROM 25%   Status On-going     PT LONG TERM GOAL #3   Title report 25% easier to back up the car   Status On-going     PT LONG TERM GOAL #4   Title decrease pain 50%   Status Partially Met     PT LONG TERM GOAL #5   Title perform gym exercises safely and independently for scapula stabilization   Status On-going               Plan - 07/29/16 1135    Clinical Impression Statement Pt reports that he is getting better overall, some tightness noted in the upper trap area that increased with exercises. Reports no pain only a little soreness.   Rehab Potential Good   PT Frequency 2x / week   PT Duration 8 weeks   PT Treatment/Interventions Cryotherapy;Electrical Stimulation;Moist Heat;Traction;Ultrasound;Patient/family education;Therapeutic exercise;Therapeutic activities;Manual techniques   PT Next Visit Plan Continue to work on strength and stability      Patient will benefit from skilled therapeutic intervention in order to improve the following deficits and impairments:  Decreased range of motion, Increased muscle spasms, Impaired flexibility, Postural dysfunction, Improper body mechanics, Pain  Visit Diagnosis: Cervicalgia  Cramp and spasm     Problem List Patient Active Problem List   Diagnosis Date Noted  . Near syncope 04/20/2016  . Leg hematoma, left, initial encounter 04/20/2016  . Bleeding   . Ecchymosis   . Left hamstring muscle strain   . Muscle tear   . RBBB 08/30/2015  . A-fib ([place]) 08/29/2015  . Chronic systolic dysfunction of left ventricle 07/11/2014  . OSA (obstructive sleep apnea) 04/07/2013  . Multinodular goiter 01/20/2013  . Cervical spondylosis without myelopathy 01/18/2013  . Atrial fibrillation with RVR ([place]) 10/27/2012  . PAF (paroxysmal atrial fibrillation) ([place]) 05/28/2012  . Left ventricular dysfunction 12/17/2011  . Low  back pain 09/11/2011  . Fatigue 05/22/2011  . PVC (premature ventricular contraction) 01/18/2011  . Persistent atrial fibrillation ([place]) 01/12/2011  . Hyperlipidemia 08/02/2010  . Hypertension 08/02/2010  . Osteoarthritis 08/02/2010  . BPH (benign prostatic hyperplasia) 08/02/2010  . GERD (gastroesophageal reflux disease) 08/02/2010  . h/o Atrial flutter [facility]) s/p ablation 2012     Scot Jun, PTA 07/29/2016, 11:37 AM  [place] [address] [place], [place], [address] Phone: [phone]   Fax:  [phone]  Name: Connor [name] MRN: [id number] Date of Birth: 09-15-1933

## 2016-07-30 ENCOUNTER — Encounter (HOSPITAL_COMMUNITY): Payer: Self-pay | Admitting: Nurse Practitioner

## 2016-07-30 ENCOUNTER — Ambulatory Visit (HOSPITAL_COMMUNITY)
Admission: RE | Admit: 2016-07-30 | Discharge: 2016-07-30 | Disposition: A | Discharge status: Home / Self Care | Payer: Medicare Other | Source: Ambulatory Visit | Attending: Nurse Practitioner | Admitting: Nurse Practitioner

## 2016-07-30 VITALS — BP 124/82 | HR 74 | Ht 71.75 in | Wt 227.0 lb

## 2016-07-30 DIAGNOSIS — R7303 Prediabetes: Secondary | ICD-10-CM | POA: Diagnosis not present

## 2016-07-30 DIAGNOSIS — I4892 Unspecified atrial flutter: Secondary | ICD-10-CM | POA: Diagnosis not present

## 2016-07-30 DIAGNOSIS — E785 Hyperlipidemia, unspecified: Secondary | ICD-10-CM | POA: Diagnosis not present

## 2016-07-30 DIAGNOSIS — G4733 Obstructive sleep apnea (adult) (pediatric): Secondary | ICD-10-CM | POA: Diagnosis not present

## 2016-07-30 DIAGNOSIS — Z7901 Long term (current) use of anticoagulants: Secondary | ICD-10-CM | POA: Diagnosis not present

## 2016-07-30 DIAGNOSIS — I4510 Unspecified right bundle-branch block: Secondary | ICD-10-CM | POA: Diagnosis not present

## 2016-07-30 DIAGNOSIS — E559 Vitamin D deficiency, unspecified: Secondary | ICD-10-CM | POA: Diagnosis not present

## 2016-07-30 DIAGNOSIS — I480 Paroxysmal atrial fibrillation: Secondary | ICD-10-CM | POA: Diagnosis present

## 2016-07-30 DIAGNOSIS — I10 Essential (primary) hypertension: Secondary | ICD-10-CM | POA: Diagnosis not present

## 2016-07-30 DIAGNOSIS — Z86718 Personal history of other venous thrombosis and embolism: Secondary | ICD-10-CM | POA: Insufficient documentation

## 2016-07-30 DIAGNOSIS — Z87891 Personal history of nicotine dependence: Secondary | ICD-10-CM | POA: Insufficient documentation

## 2016-07-30 DIAGNOSIS — Z9889 Other specified postprocedural states: Secondary | ICD-10-CM | POA: Insufficient documentation

## 2016-07-30 DIAGNOSIS — N400 Enlarged prostate without lower urinary tract symptoms: Secondary | ICD-10-CM | POA: Insufficient documentation

## 2016-07-30 DIAGNOSIS — M1990 Unspecified osteoarthritis, unspecified site: Secondary | ICD-10-CM | POA: Diagnosis not present

## 2016-07-30 MED ORDER — METOPROLOL SUCCINATE ER 50 MG PO TB24: ORAL_TABLET | ORAL | 6 refills | Status: DC

## 2016-07-30 NOTE — Progress Notes (Signed)
Patient ID: Connor [name], male   DOB: 12-06-1933, 83 y.o.   MRN: [id number]     Primary Care Physician: Elayne Snare, MD Referring Physician: Dr. [name] Connor [name] is a 83 y.o. male with a h/o PAF that recently underwent repeat afib ablation 08/29/15 for f/u in the afib clinic. He reports just one epiosode of afib a few days after ablation. Otherwise staying in SR, very little afib. Continues on tikosyn/xarelto without issues.He did have a torn hamstring with bleeding into left leg which caused hypotension and near syncope and was admitted to [facility]. Xarelto was held x 4 days and has resolved.  He asked to be seen in  the afib clinic, 3/12, for wakening with afib this am. He took his am meds and now is in SR. He said this has happened 3-4 early mornings over the last month.He is using his cpap and wore the entire night last night and still woke up with afib. He is taking his tikosyn/xarelto as prescribed. Small dose of BB at bedtime was added.   Today, he denies symptoms of palpitations, chest pain, shortness of breath, orthopnea, PND, lower extremity edema, dizziness, presyncope, syncope, or neurologic sequela. The patient is tolerating medications without difficulties and is otherwise without complaint today.   Past Medical History:  Diagnosis Date  . Abnormal nuclear cardiac imaging test 1/13   Abnormal stress test 05/2011, but cardiac cath 06/2011 showing normal coronary anatomy.  . Anemia   . Atrial flutter [facility])    s/p ablation in March 2012  . BPH (benign prostatic hyperplasia)   . Chronic anticoagulation    on Xarelto for afib  . DVT (deep venous thrombosis) ([place]) 2/12   in setting of R hip surgery, he developed R femoral DVT  . Hyperlipidemia   . Hypertension   . Obstructive sleep apnea    compliant with CPAP.  managed by Dr Elsworth Soho.  . Osteoarthritis   . Paroxysmal atrial fibrillation ([place]) 9/12   a. documented by Dr Barrett Shell on office EKG 9/12. b. maintained on Tikosyn and  Xarelto. c. s/p DCCV 10/2012.  Marland Kitchen Peptic ulcer disease   . Prediabetes   . Premature ventricular contraction   . RBBB (right bundle branch block)   . Vitamin D deficiency    Past Surgical History:  Procedure Laterality Date  . ABLATION OF DYSRHYTHMIC FOCUS  08/29/2015  . atrial flutter ablation  5/12   by JA  . CARDIAC CATHETERIZATION    . CARDIOVERSION N/A 10/28/2012   Procedure: CARDIOVERSION;  Surgeon: Burnell Blanks, MD;  Location: [place];  Service: Cardiovascular;  Laterality: N/A;  . ELECTROPHYSIOLOGIC STUDY N/A 08/29/2015   Procedure: Atrial Fibrillation Ablation;  Surgeon: [name], MD;  Location: [place] CV LAB;  Service: Cardiovascular;  Laterality: N/A;  . LEFT HEART CATHETERIZATION WITH CORONARY ANGIOGRAM N/A 06/07/2011   Procedure: LEFT HEART CATHETERIZATION WITH CORONARY ANGIOGRAM;  Surgeon: Peter M Martinique, MD;  Location: [facility] CATH LAB;  Service: Cardiovascular;  Laterality: N/A;  . TOTAL HIP ARTHROPLASTY  06/04/10   RIGHT HIP  . TOTAL KNEE ARTHROPLASTY    . TRANSURETHRAL RESECTION OF PROSTATE      Current Outpatient Prescriptions  Medication Sig Dispense Refill  . acetaminophen (TYLENOL) 500 MG tablet Take 1,000 mg by mouth 2 (two) times daily as needed for moderate pain.    Marland Kitchen atorvastatin (LIPITOR) 10 MG tablet TAKE 1 TABLET BY MOUTH EVERY DAY 90 tablet 1  . Cholecalciferol (VITAMIN D3) 5000 UNITS  TABS Take 5,000 Units by mouth See admin instructions. Take 5000 units by mouth every 4 days    . clindamycin (CLEOCIN T) 1 % external solution Apply 1 application topically daily as needed (for bumps in scalp). Apply to scalp daily as needed  2  . dofetilide (TIKOSYN) 500 MCG capsule TAKE 1 CAPSULE BY MOUTH EVERY 12 HOURS (Patient taking differently: Take 500 mcg by mouth every 12 (twelve) hours. ) 180 capsule 3  . KLOR-CON M20 20 MEQ tablet TAKE 2 TABLETS (40MEQ) BY MOUTH IN THE MORNING AND 2 TABLETS (40MEQ) IN THE EVENING. 360 tablet 0  . methocarbamol (ROBAXIN) 500  MG tablet Take 1 tablet (500 mg total) by mouth at bedtime as needed (pain). 30 tablet 3  . metoprolol succinate (TOPROL-XL) 50 MG 24 hr tablet Take 1 tablet (50mg ) in the morning by mouth and 1/2 tablet (25mg )  at bedtime. 45 tablet 6  . metoprolol tartrate (LOPRESSOR) 25 MG tablet TAKE 1 TABLET BY MOUTH EVERY 6 HOURS AS NEEDED FOR FAST HEART RATES 30 tablet 6  . Multiple Vitamin (MULITIVITAMIN) LIQD Take 5 mLs by mouth daily.    . Multiple Vitamins-Minerals (ICAPS PO) Take 1 tablet by mouth every evening.     . Omega-3 Fatty Acids (OMEGA 3 PO) Take 3 capsules by mouth every evening.     Marland Kitchen omeprazole (PRILOSEC) 20 MG capsule Take 20 mg by mouth daily.     Marland Kitchen OVER THE COUNTER MEDICATION Place 3 drops into both eyes daily as needed (dry eyes). "thera tears"    . rivaroxaban (XARELTO) 20 MG TABS tablet Take 1 tablet (20 mg total) by mouth daily. 90 tablet 1  . tamsulosin (FLOMAX) 0.4 MG CAPS capsule TAKE ONE CAPSULE BY MOUTH ONCE DAILY 30 MINUTES AFTER SAME MEAL (Patient taking differently: TAKE 0.4 MG BY MOUTH ONCE DAILY 30 MINUTES AFTER SAME MEAL) 90 capsule 1  . traMADol (ULTRAM) 50 MG tablet Take 1 tablet (50 mg total) by mouth every 6 (six) hours as needed (pain). 60 tablet 5  . vitamin B-12 (CYANOCOBALAMIN) 500 MCG tablet Take 500 mcg by mouth at bedtime.      No current facility-administered medications for this encounter.     Allergies  Allergen Reactions  . Penicillins Itching, Swelling and Other (See Comments)    Has patient had a PCN reaction causing immediate rash, facial/tongue/throat swelling, SOB or lightheadedness with hypotension: Yes Has patient had a PCN reaction causing severe rash involving mucus membranes or skin necrosis: No Has patient had a PCN reaction that required hospitalization No Has patient had a PCN reaction occurring within the last 10 years: No If all of the above answers are "NO", then may proceed with Cephalosporin use.     Social History   Social  History  . Marital status: Married    Spouse name: N/A  . Number of children: N/A  . Years of education: N/A   Occupational History  . Not on file.   Social History Main Topics  . Smoking status: Former Smoker    Packs/day: 1.00    Years: 30.00    Types: Cigarettes    Quit date: 05/06/1978  . Smokeless tobacco: Never Used  . Alcohol use 3.0 oz/week    5 Cans of beer per week     Comment: he has just about quit  . Drug use: No  . Sexual activity: Not on file   Other Topics Concern  . Not on file   Social History  Narrative   he patient lives in [place] with his spouse.  Retired.    Family History  Problem Relation Age of Onset  . Heart attack Father   . Lung disease Mother   . Arrhythmia Sister   . Atrial fibrillation Brother   . Diabetes Neg Hx     ROS- All systems are reviewed and negative except as per the HPI above  Physical Exam: Vitals:   07/30/16 1006  BP: 124/82  Pulse: 74  Weight: 227 lb (103 kg)  Height: 5' 11.75" (1.822 m)    GEN- The patient is well appearing, alert and oriented x 3 today.   Head- normocephalic, atraumatic Eyes-  Sclera clear, conjunctiva pink Ears- hearing intact Oropharynx- clear Neck- supple, no JVP Lymph- no cervical lymphadenopathy Lungs- Clear to ausculation bilaterally, normal work of breathing Heart- Regular rate and rhythm, no murmurs, rubs or gallops, PMI not laterally displaced GI- soft, NT, ND, + BS Extremities- no clubbing, cyanosis, or edema MS- no significant deformity or atrophy Skin- no rash or lesion Psych- euthymic mood, full affect Neuro- strength and sensation are intact  EKG- NSR with RBBB, pr int 164 ms, qrs int 144 ms, qtc 508 ms (stable for pt on tikosyn with RBBB) Epic records reviewed  Assessment and Plan: 1. PAF Maintaining SR for most part but has noted afib on awakening in the am 3-4x in the last month, resolved with extra BB Continue tikosyn,xarelto Contiue metoprolol SR 50 am and  25 mg  in pm Bmet/mag/tsh checked last visit and stable for tikosyn labs Continue cpap  F/u with Dr. Rayann Heman 4/30  afib clinic as needed  Butch Penny C. Joeanna Howdyshell, [facility] [address] [place], [place] [address] [phone]

## 2016-07-31 ENCOUNTER — Ambulatory Visit: Payer: Medicare Other | Admitting: Physical Therapy

## 2016-07-31 ENCOUNTER — Encounter: Payer: Self-pay | Admitting: Pulmonary Disease

## 2016-07-31 ENCOUNTER — Encounter: Payer: Self-pay | Admitting: Physical Therapy

## 2016-07-31 DIAGNOSIS — R252 Cramp and spasm: Secondary | ICD-10-CM

## 2016-07-31 DIAGNOSIS — M542 Cervicalgia: Secondary | ICD-10-CM | POA: Diagnosis not present

## 2016-07-31 NOTE — Therapy (Signed)
[place], [place], [address] Phone: [phone]   Fax:  [phone]  Physical Therapy Treatment  Patient Details  Name: Connor [name] MRN: [id number] Date of Birth: 06/28/1933 Referring Provider: Ruffin Frederick  Encounter Date: 07/31/2016      PT End of Session - 07/31/16 1042    Visit Number 8   Date for PT Re-Evaluation 09/08/16   PT Start Time 1005   PT Stop Time 1108   PT Time Calculation (min) 63 min   Activity Tolerance Patient tolerated treatment well   Behavior During Therapy [facility] for tasks assessed/performed      Past Medical History:  Diagnosis Date  . Abnormal nuclear cardiac imaging test 1/13   Abnormal stress test 05/2011, but cardiac cath 06/2011 showing normal coronary anatomy.  . Anemia   . Atrial flutter [facility])    s/p ablation in March 2012  . BPH (benign prostatic hyperplasia)   . Chronic anticoagulation    on Xarelto for afib  . DVT (deep venous thrombosis) ([place]) 2/12   in setting of R hip surgery, he developed R femoral DVT  . Hyperlipidemia   . Hypertension   . Obstructive sleep apnea    compliant with CPAP.  managed by Dr Elsworth Soho.  . Osteoarthritis   . Paroxysmal atrial fibrillation ([place]) 9/12   a. documented by Dr Barrett Shell on office EKG 9/12. b. maintained on Tikosyn and Xarelto. c. s/p DCCV 10/2012.  Marland Kitchen Peptic ulcer disease   . Prediabetes   . Premature ventricular contraction   . RBBB (right bundle branch block)   . Vitamin D deficiency     Past Surgical History:  Procedure Laterality Date  . ABLATION OF DYSRHYTHMIC FOCUS  08/29/2015  . atrial flutter ablation  5/12   by JA  . CARDIAC CATHETERIZATION    . CARDIOVERSION N/A 10/28/2012   Procedure: CARDIOVERSION;  Surgeon: Burnell Blanks, MD;  Location: [place];  Service: Cardiovascular;  Laterality: N/A;  . ELECTROPHYSIOLOGIC STUDY N/A 08/29/2015   Procedure: Atrial Fibrillation Ablation;  Surgeon: [name], MD;  Location: [place] CV LAB;  Service: Cardiovascular;  Laterality: N/A;  . LEFT HEART CATHETERIZATION WITH CORONARY ANGIOGRAM N/A 06/07/2011   Procedure: LEFT HEART CATHETERIZATION WITH CORONARY ANGIOGRAM;  Surgeon: Peter M Martinique, MD;  Location: [facility] CATH LAB;  Service: Cardiovascular;  Laterality: N/A;  . TOTAL HIP ARTHROPLASTY  06/04/10   RIGHT HIP  . TOTAL KNEE ARTHROPLASTY    . TRANSURETHRAL RESECTION OF PROSTATE      There were no vitals filed for this visit.      Subjective Assessment - 07/31/16 1014    Subjective Feeling good.   Currently in Pain? Yes   Pain Score 2    Pain Location Neck   Aggravating Factors  turning head                         [facility] Adult PT Treatment/Exercise - 07/31/16 0001      Neck Exercises: Machines for Strengthening   Airodyne for Upper Extremity Motion NuStep Level 5 x 6 minutes   Cybex Row 25# 2 sets 15   Other Machines for Strengthening lat pulls 25# 2x15   Other Machines for Strengthening scapular stabilization on the pulleys with 10# 2 ways     Neck Exercises: Theraband   Shoulder External Rotation 15 reps   Shoulder External Rotation Limitations arms at 90  degrees abduction   Other Theraband Exercises PNF patterns with red tband     Neck Exercises: Standing   Other Standing Exercises standing snow angels 10 times for posture   Other Standing Exercises wand exercises for shoulder stretches, rhythmic stabilization with 6# ball     Neck Exercises: Supine   Other Supine Exercise head on ball cervivcal retractions     Moist Heat Therapy   Number Minutes Moist Heat 15 Minutes   Moist Heat Location Cervical     Electrical Stimulation   Electrical Stimulation Location right cervical area   Electrical Stimulation Action IFC   Electrical Stimulation Parameters supine   Electrical Stimulation Goals Pain     Traction   Type of Traction Cervical   Min (lbs) 17   Hold Time static   Time 15                   PT Short Term Goals - 07/16/16 1601      PT SHORT TERM GOAL #1   Title independent with initial HEP   Status Achieved           PT Long Term Goals - 07/31/16 1044      PT LONG TERM GOAL #2   Title increase cervical ROM 25%   Status Achieved     PT LONG TERM GOAL #3   Title report 25% easier to back up the car   Status Achieved               Plan - 07/31/16 1043    Clinical Impression Statement Pateint continues to report improvement, he is stiff with a knot in the right upper trap.  He reports he feels that he is doing better with his posture and backing up the car   PT Next Visit Plan Continue to work on strength and stability   Consulted and Agree with Plan of Care Patient      Patient will benefit from skilled therapeutic intervention in order to improve the following deficits and impairments:  Decreased range of motion, Increased muscle spasms, Impaired flexibility, Postural dysfunction, Improper body mechanics, Pain  Visit Diagnosis: Cervicalgia  Cramp and spasm     Problem List Patient Active Problem List   Diagnosis Date Noted  . Near syncope 04/20/2016  . Leg hematoma, left, initial encounter 04/20/2016  . Bleeding   . Ecchymosis   . Left hamstring muscle strain   . Muscle tear   . RBBB 08/30/2015  . A-fib ([place]) 08/29/2015  . Chronic systolic dysfunction of left ventricle 07/11/2014  . OSA (obstructive sleep apnea) 04/07/2013  . Multinodular goiter 01/20/2013  . Cervical spondylosis without myelopathy 01/18/2013  . Atrial fibrillation with RVR ([place]) 10/27/2012  . PAF (paroxysmal atrial fibrillation) ([place]) 05/28/2012  . Left ventricular dysfunction 12/17/2011  . Low back pain 09/11/2011  . Fatigue 05/22/2011  . PVC (premature ventricular contraction) 01/18/2011  . Persistent atrial fibrillation ([place]) 01/12/2011  . Hyperlipidemia 08/02/2010  . Hypertension 08/02/2010  . Osteoarthritis 08/02/2010  . BPH (benign  prostatic hyperplasia) 08/02/2010  . GERD (gastroesophageal reflux disease) 08/02/2010  . h/o Atrial flutter [facility]) s/p ablation 2012     Sumner Boast., PT 07/31/2016, 10:45 AM  [place], [place], [address] Phone: [phone]   Fax:  [phone]  Name: HANCEL ION MRN: [id number] Date of Birth: 1933-10-27

## 2016-08-05 ENCOUNTER — Telehealth: Payer: Self-pay | Admitting: Pulmonary Disease

## 2016-08-05 NOTE — Telephone Encounter (Signed)
Per Dr. Elsworth Soho, CPAP settings are set at 11cm. Seems to be working well for patient. Wants the patient to be more consistent with using it 4-6hrs a night.

## 2016-08-06 NOTE — Telephone Encounter (Signed)
Spoke with patient. He is aware of results.

## 2016-08-12 ENCOUNTER — Ambulatory Visit: Payer: Medicare Other | Attending: Endocrinology | Admitting: Physical Therapy

## 2016-08-12 ENCOUNTER — Encounter: Payer: Self-pay | Admitting: Physical Therapy

## 2016-08-12 DIAGNOSIS — R252 Cramp and spasm: Secondary | ICD-10-CM | POA: Insufficient documentation

## 2016-08-12 DIAGNOSIS — M542 Cervicalgia: Secondary | ICD-10-CM | POA: Insufficient documentation

## 2016-08-12 NOTE — Therapy (Signed)
[place], [place], [address] Phone: [phone]   Fax:  [phone]  Physical Therapy Treatment  Patient Details  Name: Connor [name] MRN: [id number] Date of Birth: [birth year] Referring Provider: Ruffin Frederick  Encounter Date: 08/12/2016      PT End of Session - 08/12/16 1048    Visit Number 9   Date for PT Re-Evaluation 09/08/16   PT Start Time 1011   PT Stop Time 1116   PT Time Calculation (min) 65 min   Activity Tolerance Patient tolerated treatment well   Behavior During Therapy [facility] for tasks assessed/performed      Past Medical History:  Diagnosis Date  . Abnormal nuclear cardiac imaging test 1/13   Abnormal stress test 05/2011, but cardiac cath 06/2011 showing normal coronary anatomy.  . Anemia   . Atrial flutter [facility])    s/p ablation in March 2012  . BPH (benign prostatic hyperplasia)   . Chronic anticoagulation    on Xarelto for afib  . DVT (deep venous thrombosis) ([place]) 2/12   in setting of R hip surgery, he developed R femoral DVT  . Hyperlipidemia   . Hypertension   . Obstructive sleep apnea    compliant with CPAP.  managed by Dr Elsworth Soho.  . Osteoarthritis   . Paroxysmal atrial fibrillation ([place]) 9/12   a. documented by Dr Barrett Shell on office EKG 9/12. b. maintained on Tikosyn and Xarelto. c. s/p DCCV 10/2012.  Marland Kitchen Peptic ulcer disease   . Prediabetes   . Premature ventricular contraction   . RBBB (right bundle branch block)   . Vitamin D deficiency     Past Surgical History:  Procedure Laterality Date  . ABLATION OF DYSRHYTHMIC FOCUS  08/29/2015  . atrial flutter ablation  5/12   by JA  . CARDIAC CATHETERIZATION    . CARDIOVERSION N/A 10/28/2012   Procedure: CARDIOVERSION;  Surgeon: Burnell Blanks, MD;  Location: [place];  Service: Cardiovascular;  Laterality: N/A;  . ELECTROPHYSIOLOGIC STUDY N/A 08/29/2015   Procedure: Atrial Fibrillation Ablation;  Surgeon: [name],  MD;  Location: [place] CV LAB;  Service: Cardiovascular;  Laterality: N/A;  . LEFT HEART CATHETERIZATION WITH CORONARY ANGIOGRAM N/A 06/07/2011   Procedure: LEFT HEART CATHETERIZATION WITH CORONARY ANGIOGRAM;  Surgeon: Peter M Martinique, MD;  Location: [facility] CATH LAB;  Service: Cardiovascular;  Laterality: N/A;  . TOTAL HIP ARTHROPLASTY  06/04/10   RIGHT HIP  . TOTAL KNEE ARTHROPLASTY    . TRANSURETHRAL RESECTION OF PROSTATE      There were no vitals filed for this visit.      Subjective Assessment - 08/12/16 1010    Subjective I am much better than when I started but still some pain and stiffness, I think going on vacation last week did not help as I did a lot and did not exercise as much as I should have   Currently in Pain? Yes   Pain Score 3    Pain Location Neck   Pain Orientation Right                         [facility] Adult PT Treatment/Exercise - 08/12/16 0001      Neck Exercises: Machines for Strengthening   UBE (Upper Arm Bike) L 5 3 fwd/3 back   Cybex Row 25# 2 sets 15   Other Machines for Strengthening lat pulls 25# 2x15   Other Machines for  Strengthening scapular stabilization on the pulleys with 10# 2 ways, triceps 35#, biceps 15#     Neck Exercises: Theraband   Shoulder External Rotation 15 reps   Shoulder External Rotation Limitations arms at 90 degrees abduction     Neck Exercises: Standing   Other Standing Exercises standing snow angels 10 times for posture     Neck Exercises: Supine   Other Supine Exercise head on ball cervivcal retractions     Moist Heat Therapy   Number Minutes Moist Heat 15 Minutes   Moist Heat Location Cervical     Electrical Stimulation   Electrical Stimulation Location right cervical area   Electrical Stimulation Action IFC   Electrical Stimulation Parameters supine   Electrical Stimulation Goals Pain     Traction   Type of Traction Cervical   Min (lbs) 18   Hold Time static   Time 15     Manual Therapy   Manual  Therapy Soft tissue mobilization   Manual therapy comments RT cerv   Soft tissue mobilization STM to the right upper trap and right cervical parapsinals                  PT Short Term Goals - 07/16/16 1601      PT SHORT TERM GOAL #1   Title independent with initial HEP   Status Achieved           PT Long Term Goals - 08/12/16 1049      PT LONG TERM GOAL #4   Title decrease pain 50%   Status Partially Met     PT LONG TERM GOAL #5   Title perform gym exercises safely and independently for scapula stabilization   Status Partially Met               Plan - 08/12/16 1048    Clinical Impression Statement Had some increased stiffness with spasms today after being gone a week, reports that he also did a lot of yardwork.  ROM is much improved with less c/o dizziness   PT Next Visit Plan Continue to work on strength and stability   Consulted and Agree with Plan of Care Patient      Patient will benefit from skilled therapeutic intervention in order to improve the following deficits and impairments:  Decreased range of motion, Increased muscle spasms, Impaired flexibility, Postural dysfunction, Improper body mechanics, Pain  Visit Diagnosis: Cervicalgia  Cramp and spasm     Problem List Patient Active Problem List   Diagnosis Date Noted  . Near syncope 04/20/2016  . Leg hematoma, left, initial encounter 04/20/2016  . Bleeding   . Ecchymosis   . Left hamstring muscle strain   . Muscle tear   . RBBB 08/30/2015  . A-fib ([place]) 08/29/2015  . Chronic systolic dysfunction of left ventricle 07/11/2014  . OSA (obstructive sleep apnea) 04/07/2013  . Multinodular goiter 01/20/2013  . Cervical spondylosis without myelopathy 01/18/2013  . Atrial fibrillation with RVR ([place]) 10/27/2012  . PAF (paroxysmal atrial fibrillation) ([place]) 05/28/2012  . Left ventricular dysfunction 12/17/2011  . Low back pain 09/11/2011  . Fatigue 05/22/2011  . PVC (premature  ventricular contraction) 01/18/2011  . Persistent atrial fibrillation ([place]) 01/12/2011  . Hyperlipidemia 08/02/2010  . Hypertension 08/02/2010  . Osteoarthritis 08/02/2010  . BPH (benign prostatic hyperplasia) 08/02/2010  . GERD (gastroesophageal reflux disease) 08/02/2010  . h/o Atrial flutter [facility]) s/p ablation 2012     Delmos Velaquez W., PT 08/12/2016, 10:50 AM  [place] [address] [place], [place], [address] Phone: [phone]   Fax:  [phone]  Name: Connor [name] MRN: [id number] Date of Birth: Aug 07, 1933

## 2016-08-22 ENCOUNTER — Ambulatory Visit: Payer: Medicare Other | Admitting: Physical Therapy

## 2016-08-22 ENCOUNTER — Encounter: Payer: Self-pay | Admitting: Physical Therapy

## 2016-08-22 DIAGNOSIS — M542 Cervicalgia: Secondary | ICD-10-CM

## 2016-08-22 DIAGNOSIS — R252 Cramp and spasm: Secondary | ICD-10-CM

## 2016-08-22 NOTE — Therapy (Signed)
[place], [place], [address] Phone: [phone]   Fax:  [phone]  Physical Therapy Treatment  Patient Details  Name: Connor [name] MRN: [id number] Date of Birth: 06-29-1933 Referring Provider: Ruffin Frederick  Encounter Date: 08/22/2016      PT End of Session - 08/22/16 1120    Visit Number 10   Date for PT Re-Evaluation 09/08/16   PT Start Time 7628   PT Stop Time 1147   PT Time Calculation (min) 56 min   Activity Tolerance Patient tolerated treatment well   Behavior During Therapy [facility] for tasks assessed/performed      Past Medical History:  Diagnosis Date  . Abnormal nuclear cardiac imaging test 1/13   Abnormal stress test 05/2011, but cardiac cath 06/2011 showing normal coronary anatomy.  . Anemia   . Atrial flutter [facility])    s/p ablation in March 2012  . BPH (benign prostatic hyperplasia)   . Chronic anticoagulation    on Xarelto for afib  . DVT (deep venous thrombosis) ([place]) 2/12   in setting of R hip surgery, he developed R femoral DVT  . Hyperlipidemia   . Hypertension   . Obstructive sleep apnea    compliant with CPAP.  managed by Dr Elsworth Soho.  . Osteoarthritis   . Paroxysmal atrial fibrillation ([place]) 9/12   a. documented by Dr Barrett Shell on office EKG 9/12. b. maintained on Tikosyn and Xarelto. c. s/p DCCV 10/2012.  Marland Kitchen Peptic ulcer disease   . Prediabetes   . Premature ventricular contraction   . RBBB (right bundle branch block)   . Vitamin D deficiency     Past Surgical History:  Procedure Laterality Date  . ABLATION OF DYSRHYTHMIC FOCUS  08/29/2015  . atrial flutter ablation  5/12   by JA  . CARDIAC CATHETERIZATION    . CARDIOVERSION N/A 10/28/2012   Procedure: CARDIOVERSION;  Surgeon: Burnell Blanks, MD;  Location: [place];  Service: Cardiovascular;  Laterality: N/A;  . ELECTROPHYSIOLOGIC STUDY N/A 08/29/2015   Procedure: Atrial Fibrillation Ablation;  Surgeon: [name], MD;  Location: [place] CV LAB;  Service: Cardiovascular;  Laterality: N/A;  . LEFT HEART CATHETERIZATION WITH CORONARY ANGIOGRAM N/A 06/07/2011   Procedure: LEFT HEART CATHETERIZATION WITH CORONARY ANGIOGRAM;  Surgeon: Peter M Martinique, MD;  Location: [facility] CATH LAB;  Service: Cardiovascular;  Laterality: N/A;  . TOTAL HIP ARTHROPLASTY  06/04/10   RIGHT HIP  . TOTAL KNEE ARTHROPLASTY    . TRANSURETHRAL RESECTION OF PROSTATE      There were no vitals filed for this visit.      Subjective Assessment - 08/22/16 1052    Subjective Patient has been out of town for the past 10 days.  He reports that he is overall feeling better, less pain, reports some spasms with some light headedness.   Currently in Pain? Yes   Pain Score 2    Pain Location Neck   Pain Orientation Right   Aggravating Factors  stress   Pain Relieving Factors treatment                         [facility] Adult PT Treatment/Exercise - 08/22/16 0001      Neck Exercises: Machines for Strengthening   UBE (Upper Arm Bike) L 6 3 fwd/3 back   Cybex Row 25# 2 sets 15   Other Machines for Strengthening lat pulls 25# 2x15   Other  Machines for Strengthening scapular stabilization on the pulleys with 10# 2 ways, triceps 35#, biceps 15#     Neck Exercises: Theraband   Other Theraband Exercises PNF patterns with red tband     Neck Exercises: Standing   Other Standing Exercises standing snow angels 10 times for posture   Other Standing Exercises wand exercises for shoulder stretches, rhythmic stabilization with 6# ball     Neck Exercises: Seated   Other Seated Exercise 4# bent over row, extension and 2# reverse flies     Neck Exercises: Supine   Other Supine Exercise head on ball cervivcal retractions     Moist Heat Therapy   Number Minutes Moist Heat 15 Minutes   Moist Heat Location Cervical     Electrical Stimulation   Electrical Stimulation Location right cervical area   Electrical Stimulation Action  IFC   Electrical Stimulation Parameters supine   Electrical Stimulation Goals Pain     Traction   Type of Traction Cervical   Min (lbs) 18   Hold Time static   Time 15                  PT Short Term Goals - 07/16/16 1601      PT SHORT TERM GOAL #1   Title independent with initial HEP   Status Achieved           PT Long Term Goals - 08/25/16 1125      PT LONG TERM GOAL #4   Title decrease pain 50%   Status Achieved     PT LONG TERM GOAL #5   Title perform gym exercises safely and independently for scapula stabilization   Status Partially Met               Plan - August 25, 2016 1121    Clinical Impression Statement Patient overall reports feeling well, no issues with ROM, still some spasms and tightness.  He is going to return to the gym next week.   PT Next Visit Plan We will place him on hold, he is to try to return safely to the gym   Consulted and Agree with Plan of Care Patient      Patient will benefit from skilled therapeutic intervention in order to improve the following deficits and impairments:  Decreased range of motion, Increased muscle spasms, Impaired flexibility, Postural dysfunction, Improper body mechanics, Pain  Visit Diagnosis: Cervicalgia  Cramp and spasm       G-Codes - Aug 25, 2016 1126    Functional Assessment Tool Used (Outpatient Only) foto 42% limitation   Functional Limitation Changing and maintaining body position   Changing and Maintaining Body Position Current Status (Z6606) At least 40 percent but less than 60 percent impaired, limited or restricted   Changing and Maintaining Body Position Goal Status (T0160) At least 40 percent but less than 60 percent impaired, limited or restricted      Problem List Patient Active Problem List   Diagnosis Date Noted  . Near syncope 04/20/2016  . Leg hematoma, left, initial encounter 04/20/2016  . Bleeding   . Ecchymosis   . Left hamstring muscle strain   . Muscle tear   . RBBB  08/30/2015  . A-fib ([place]) 08/29/2015  . Chronic systolic dysfunction of left ventricle 07/11/2014  . OSA (obstructive sleep apnea) 04/07/2013  . Multinodular goiter 01/20/2013  . Cervical spondylosis without myelopathy 01/18/2013  . Atrial fibrillation with RVR ([place]) 10/27/2012  . PAF (paroxysmal atrial fibrillation) ([place]) 05/28/2012  .  Left ventricular dysfunction 12/17/2011  . Low back pain 09/11/2011  . Fatigue 05/22/2011  . PVC (premature ventricular contraction) 01/18/2011  . Persistent atrial fibrillation ([place]) 01/12/2011  . Hyperlipidemia 08/02/2010  . Hypertension 08/02/2010  . Osteoarthritis 08/02/2010  . BPH (benign prostatic hyperplasia) 08/02/2010  . GERD (gastroesophageal reflux disease) 08/02/2010  . h/o Atrial flutter [facility]) s/p ablation 2012     ALBRIGHT,MICHAEL W., PT 08/22/2016, 11:27 AM  [place], [place], [address] Phone: [phone]   Fax:  [phone]  Name: Connor [name] MRN: [id number] Date of Birth: 1934/03/07

## 2016-09-02 ENCOUNTER — Ambulatory Visit (INDEPENDENT_AMBULATORY_CARE_PROVIDER_SITE_OTHER): Payer: Medicare Other | Admitting: Internal Medicine

## 2016-09-02 ENCOUNTER — Encounter: Payer: Self-pay | Admitting: Internal Medicine

## 2016-09-02 VITALS — BP 122/60 | HR 77 | Ht 72.0 in | Wt 229.0 lb

## 2016-09-02 DIAGNOSIS — G4733 Obstructive sleep apnea (adult) (pediatric): Secondary | ICD-10-CM

## 2016-09-02 DIAGNOSIS — I480 Paroxysmal atrial fibrillation: Secondary | ICD-10-CM | POA: Diagnosis not present

## 2016-09-02 DIAGNOSIS — I519 Heart disease, unspecified: Secondary | ICD-10-CM

## 2016-09-02 DIAGNOSIS — I10 Essential (primary) hypertension: Secondary | ICD-10-CM | POA: Diagnosis not present

## 2016-09-02 NOTE — Progress Notes (Signed)
Electrophysiology Office Note   Date:  09/02/2016   ID:  Connor, [name] August 05, 1933, MRN [id number]  PCP:  Elayne Snare, MD  Cardiologist:   (previously Mare Ferrari) Primary Electrophysiologist:  [name], MD    Chief Complaint  Patient presents with  . PAF (paroxysmal atrial fibrillation) (HCC)     History of Present Illness: Connor [name] is a 83 y.o. male who presents today for electrophysiology follow-up.  Since his last visit, he has done very well.  He has rare palpitations but feels that overall he is maintaining sinus rhythm.  Today, he denies symptoms of chest pain, shortness of breath, orthopnea, PND, lower extremity edema, claudication, dizziness, presyncope, syncope, bleeding, or neurologic sequela. The patient is tolerating medications without difficulties and is otherwise without complaint today.    Past Medical History:  Diagnosis Date  . Abnormal nuclear cardiac imaging test 1/13   Abnormal stress test 05/2011, but cardiac cath 06/2011 showing normal coronary anatomy.  . Anemia   . Atrial flutter [facility])    s/p ablation in March 2012  . BPH (benign prostatic hyperplasia)   . Chronic anticoagulation    on Xarelto for afib  . DVT (deep venous thrombosis) ([place]) 2/12   in setting of R hip surgery, he developed R femoral DVT  . Hyperlipidemia   . Hypertension   . Obstructive sleep apnea    compliant with CPAP.  managed by Dr Elsworth Soho.  . Osteoarthritis   . Paroxysmal atrial fibrillation ([place]) 9/12   a. documented by Dr Barrett Shell on office EKG 9/12. b. maintained on Tikosyn and Xarelto. c. s/p DCCV 10/2012.  Marland Kitchen Peptic ulcer disease   . Prediabetes   . Premature ventricular contraction   . RBBB (right bundle branch block)   . Vitamin D deficiency    Past Surgical History:  Procedure Laterality Date  . ABLATION OF DYSRHYTHMIC FOCUS  08/29/2015  . atrial flutter ablation  5/12   by JA  . CARDIAC CATHETERIZATION    . CARDIOVERSION N/A 10/28/2012   Procedure:  CARDIOVERSION;  Surgeon: Burnell Blanks, MD;  Location: [place];  Service: Cardiovascular;  Laterality: N/A;  . ELECTROPHYSIOLOGIC STUDY N/A 08/29/2015   Procedure: Atrial Fibrillation Ablation;  Surgeon: [name], MD;  Location: [place] CV LAB;  Service: Cardiovascular;  Laterality: N/A;  . LEFT HEART CATHETERIZATION WITH CORONARY ANGIOGRAM N/A 06/07/2011   Procedure: LEFT HEART CATHETERIZATION WITH CORONARY ANGIOGRAM;  Surgeon: Peter M Martinique, MD;  Location: [facility] CATH LAB;  Service: Cardiovascular;  Laterality: N/A;  . TOTAL HIP ARTHROPLASTY  06/04/10   RIGHT HIP  . TOTAL KNEE ARTHROPLASTY    . TRANSURETHRAL RESECTION OF PROSTATE       Current Outpatient Prescriptions  Medication Sig Dispense Refill  . acetaminophen (TYLENOL) 500 MG tablet Take 1,000 mg by mouth 2 (two) times daily as needed for moderate pain.    Marland Kitchen atorvastatin (LIPITOR) 10 MG tablet TAKE 1 TABLET BY MOUTH EVERY DAY 90 tablet 1  . Cholecalciferol (VITAMIN D3) 5000 UNITS TABS Take 5,000 Units by mouth See admin instructions. Take 5000 units by mouth every 4 days    . clindamycin (CLEOCIN T) 1 % external solution Apply 1 application topically daily as needed (for bumps in scalp). Apply to scalp daily as needed  2  . dofetilide (TIKOSYN) 500 MCG capsule TAKE 1 CAPSULE BY MOUTH EVERY 12 HOURS (Patient taking differently: Take 500 mcg by mouth every 12 (twelve) hours. ) 180 capsule 3  . KLOR-CON  M20 20 MEQ tablet TAKE 2 TABLETS (40MEQ) BY MOUTH IN THE MORNING AND 2 TABLETS (40MEQ) IN THE EVENING. 360 tablet 0  . methocarbamol (ROBAXIN) 500 MG tablet Take 1 tablet (500 mg total) by mouth at bedtime as needed (pain). 30 tablet 3  . metoprolol succinate (TOPROL-XL) 50 MG 24 hr tablet Take 1 tablet (50mg ) in the morning by mouth and 1/2 tablet (25mg )  at bedtime. 45 tablet 6  . metoprolol tartrate (LOPRESSOR) 25 MG tablet TAKE 1 TABLET BY MOUTH EVERY 6 HOURS AS NEEDED FOR FAST HEART RATES 30 tablet 6  . Multiple Vitamin  (MULITIVITAMIN) LIQD Take 5 mLs by mouth daily.    . Multiple Vitamins-Minerals (ICAPS PO) Take 1 tablet by mouth every evening.     . Omega-3 Fatty Acids (OMEGA 3 PO) Take 3 capsules by mouth every evening.     Marland Kitchen omeprazole (PRILOSEC) 20 MG capsule Take 20 mg by mouth daily.     Marland Kitchen OVER THE COUNTER MEDICATION Place 3 drops into both eyes daily as needed (dry eyes). "thera tears"    . rivaroxaban (XARELTO) 20 MG TABS tablet Take 1 tablet (20 mg total) by mouth daily. 90 tablet 1  . tamsulosin (FLOMAX) 0.4 MG CAPS capsule TAKE ONE CAPSULE BY MOUTH ONCE DAILY 30 MINUTES AFTER SAME MEAL (Patient taking differently: TAKE 0.4 MG BY MOUTH ONCE DAILY 30 MINUTES AFTER SAME MEAL) 90 capsule 1  . traMADol (ULTRAM) 50 MG tablet Take 1 tablet (50 mg total) by mouth every 6 (six) hours as needed (pain). 60 tablet 5  . vitamin B-12 (CYANOCOBALAMIN) 500 MCG tablet Take 500 mcg by mouth at bedtime.      No current facility-administered medications for this visit.     Allergies:   Penicillins   Social History:  The patient  reports that he quit smoking about 38 years ago. His smoking use included Cigarettes. He has a 30.00 pack-year smoking history. He has never used smokeless tobacco. He reports that he drinks about 3.0 oz of alcohol per week . He reports that he does not use drugs.   Family History:  The patient's family history includes Arrhythmia in his sister; Atrial fibrillation in his brother; Heart attack in his father; Lung disease in his mother.    ROS:  Please see the history of present illness.   All other systems are reviewed and negative.    PHYSICAL EXAM: VS:  BP 122/60   Pulse 77   Ht 6' (1.829 m)   Wt 229 lb (103.9 kg)   SpO2 98%   BMI 31.06 kg/m  , BMI Body mass index is 31.06 kg/m. GEN: Well nourished, well developed, in no acute distress  HEENT: normal  Neck: no JVD, carotid bruits, or masses Cardiac: RRR; no murmurs, rubs, or gallops,no edema  Respiratory:  clear to  auscultation bilaterally, normal work of breathing GI: soft, nontender, nondistended, + BS MS: no deformity or atrophy  Skin: warm and dry  Neuro:  Strength and sensation are intact Psych: euthymic mood, full affect  EKG:  EKG tracing from 07/30/16 is reviewed personally and shows sinus rhythm 74 bpm, PR 164 msec, with RBBB, Qtc 508 (stable)   Recent Labs: 04/26/2016: ALT 21; Hemoglobin 12.7; Platelets 195.0 07/15/2016: BUN 12; Creatinine, Ser 0.62; Magnesium 2.2; Potassium 4.3; Sodium 140; TSH 1.264    Lipid Panel     Component Value Date/Time   CHOL 155 09/20/2015 0908   TRIG 61.0 09/20/2015 0908   HDL 46.60  09/20/2015 0908   CHOLHDL 3 09/20/2015 0908   VLDL 12.2 09/20/2015 0908   LDLCALC 96 09/20/2015 0908     Wt Readings from Last 3 Encounters:  09/02/16 229 lb (103.9 kg)  07/30/16 227 lb (103 kg)  07/15/16 229 lb 6.4 oz (104.1 kg)     ASSESSMENT AND PLAN:  1.  Paroxysmal atrial fibrillation Doing very well s/p ablation maintaining sinus rhythm [place]ia tracings are reviewed today He has rare afib but mostly sinus rhythm I think we should therefore keep him on tikosyn going forward.  ekg/ labs from 3/18 are reviewed today No other changes today chad2vasc is at least 4.  He has also had prior DVT.  Would continue life long anticoagulation Continue xarelto  2. OSA Compliance with CPAP is advised  3. HTN Stable No change required today  4. Nonischemic CM Repeat Echo reveals EF 45-50% No changes at this time. Consider addition of losartan on follow-up which may also help with atrial remodeling--> he does not wish to make changes today.  Current medicines are reviewed at length with the patient today.   The patient does not have concerns regarding his medicines.  The following changes were made today:  none   Return to see me in 3 months  Signed, [name], MD  09/02/2016 10:23 AM     [facility] [address] [place]   [address] [phone] (office) [phone] (fax)

## 2016-09-02 NOTE — Patient Instructions (Signed)
Medication Instructions:  Your physician recommends that you continue on your current medications as directed. Please refer to the Current Medication list given to you today.   Follow-Up: Your physician wants you to follow-up in: 91 MONTHS with Dr. Rayann Heman. You will receive a reminder letter in the mail two months in advance. If you don't receive a letter, please call our office to schedule the follow-up appointment.     Any Other Special Instructions Will Be Listed Below (If Applicable).     If you need a refill on your cardiac medications before your next appointment, please call your pharmacy.

## 2016-09-12 ENCOUNTER — Other Ambulatory Visit: Payer: Self-pay | Admitting: Endocrinology

## 2016-09-12 DIAGNOSIS — D508 Other iron deficiency anemias: Secondary | ICD-10-CM

## 2016-09-12 DIAGNOSIS — E7800 Pure hypercholesterolemia, unspecified: Secondary | ICD-10-CM

## 2016-09-12 DIAGNOSIS — R7301 Impaired fasting glucose: Secondary | ICD-10-CM

## 2016-09-12 DIAGNOSIS — E042 Nontoxic multinodular goiter: Secondary | ICD-10-CM

## 2016-09-12 DIAGNOSIS — E559 Vitamin D deficiency, unspecified: Secondary | ICD-10-CM

## 2016-09-13 ENCOUNTER — Encounter: Payer: Self-pay | Admitting: Endocrinology

## 2016-09-16 ENCOUNTER — Other Ambulatory Visit: Payer: Medicare Other

## 2016-09-16 ENCOUNTER — Other Ambulatory Visit: Payer: Self-pay | Admitting: Cardiovascular Disease

## 2016-09-19 ENCOUNTER — Encounter: Payer: Medicare Other | Admitting: Endocrinology

## 2016-09-29 ENCOUNTER — Other Ambulatory Visit: Payer: Self-pay | Admitting: Endocrinology

## 2016-10-17 ENCOUNTER — Ambulatory Visit (HOSPITAL_COMMUNITY)
Admission: RE | Admit: 2016-10-17 | Discharge: 2016-10-17 | Disposition: A | Discharge status: Home / Self Care | Payer: Medicare Other | Source: Ambulatory Visit | Attending: Nurse Practitioner | Admitting: Nurse Practitioner

## 2016-10-17 ENCOUNTER — Encounter (HOSPITAL_COMMUNITY): Payer: Self-pay | Admitting: Nurse Practitioner

## 2016-10-17 VITALS — BP 98/72 | HR 58 | Ht 72.0 in | Wt 228.0 lb

## 2016-10-17 DIAGNOSIS — R7303 Prediabetes: Secondary | ICD-10-CM | POA: Diagnosis not present

## 2016-10-17 DIAGNOSIS — Z9889 Other specified postprocedural states: Secondary | ICD-10-CM | POA: Diagnosis not present

## 2016-10-17 DIAGNOSIS — I10 Essential (primary) hypertension: Secondary | ICD-10-CM | POA: Insufficient documentation

## 2016-10-17 DIAGNOSIS — Z86718 Personal history of other venous thrombosis and embolism: Secondary | ICD-10-CM | POA: Diagnosis not present

## 2016-10-17 DIAGNOSIS — E785 Hyperlipidemia, unspecified: Secondary | ICD-10-CM | POA: Diagnosis not present

## 2016-10-17 DIAGNOSIS — N400 Enlarged prostate without lower urinary tract symptoms: Secondary | ICD-10-CM | POA: Diagnosis not present

## 2016-10-17 DIAGNOSIS — M1990 Unspecified osteoarthritis, unspecified site: Secondary | ICD-10-CM | POA: Insufficient documentation

## 2016-10-17 DIAGNOSIS — E559 Vitamin D deficiency, unspecified: Secondary | ICD-10-CM | POA: Insufficient documentation

## 2016-10-17 DIAGNOSIS — Z87891 Personal history of nicotine dependence: Secondary | ICD-10-CM | POA: Diagnosis not present

## 2016-10-17 DIAGNOSIS — G4733 Obstructive sleep apnea (adult) (pediatric): Secondary | ICD-10-CM | POA: Diagnosis not present

## 2016-10-17 DIAGNOSIS — I4510 Unspecified right bundle-branch block: Secondary | ICD-10-CM | POA: Diagnosis not present

## 2016-10-17 DIAGNOSIS — Z7901 Long term (current) use of anticoagulants: Secondary | ICD-10-CM | POA: Insufficient documentation

## 2016-10-17 DIAGNOSIS — I480 Paroxysmal atrial fibrillation: Secondary | ICD-10-CM

## 2016-10-17 LAB — BASIC METABOLIC PANEL
ANION GAP: 5 (ref 5–15)
BUN: 14 mg/dL (ref 6–20)
CALCIUM: 8.5 mg/dL — AB (ref 8.9–10.3)
CO2: 27 mmol/L (ref 22–32)
Chloride: 107 mmol/L (ref 101–111)
Creatinine, Ser: 0.82 mg/dL (ref 0.61–1.24)
Glucose, Bld: 110 mg/dL — ABNORMAL HIGH (ref 65–99)
Potassium: 4.1 mmol/L (ref 3.5–5.1)
Sodium: 139 mmol/L (ref 135–145)

## 2016-10-17 LAB — MAGNESIUM: MAGNESIUM: 2.3 mg/dL (ref 1.7–2.4)

## 2016-10-17 NOTE — Progress Notes (Signed)
Patient ID: Connor [name], male   DOB: August 19, 1933, 83 y.o.   MRN: [id number]     Primary Care Physician: Elayne Snare, MD Referring Physician: Dr. [name] Connor [name] is a 83 y.o. male with a h/o PAF that recently underwent repeat afib ablation 08/29/15 for f/u in the afib clinic. He reports just one epiosode of afib a few days after ablation. Otherwise staying in SR, very little afib. Continues on tikosyn/xarelto without issues.He did have a torn hamstring with bleeding into left leg which caused hypotension and near syncope and was admitted to [facility]. Xarelto was held x 4 days and has resolved.  He asked to be seen in  the afib clinic, 3/12, for wakening with afib this am. He took his am meds and now is in SR. He said this has happened 3-4 early mornings over the last month.He is using his cpap and wore the entire night last night and still woke up with afib. He is taking his tikosyn/xarelto as prescribed. Small dose of BB at bedtime was added.  Pt asked to be in afib clinic, 6/14, for increased afib burden over several weeks, occurs several times a week . He has a Investment banker, operational and brings in afib strips for for review. Will last on a average of 2 hours. He continues on max dose of tikosyn and is limited on BB dose due to hypotension.  Today, he denies symptoms of palpitations, chest pain, shortness of breath, orthopnea, PND, lower extremity edema, dizziness, presyncope, syncope, or neurologic sequela. The patient is tolerating medications without difficulties and is otherwise without complaint today.   Past Medical History:  Diagnosis Date  . Abnormal nuclear cardiac imaging test 1/13   Abnormal stress test 05/2011, but cardiac cath 06/2011 showing normal coronary anatomy.  . Anemia   . Atrial flutter [facility])    s/p ablation in March 2012  . BPH (benign prostatic hyperplasia)   . Chronic anticoagulation    on Xarelto for afib  . DVT (deep venous thrombosis) ([place]) 2/12   in setting of R hip  surgery, he developed R femoral DVT  . Hyperlipidemia   . Hypertension   . Obstructive sleep apnea    compliant with CPAP.  managed by Dr Elsworth Soho.  . Osteoarthritis   . Paroxysmal atrial fibrillation ([place]) 9/12   a. documented by Dr Barrett Shell on office EKG 9/12. b. maintained on Tikosyn and Xarelto. c. s/p DCCV 10/2012.  Marland Kitchen Peptic ulcer disease   . Prediabetes   . Premature ventricular contraction   . RBBB (right bundle branch block)   . Vitamin D deficiency    Past Surgical History:  Procedure Laterality Date  . ABLATION OF DYSRHYTHMIC FOCUS  08/29/2015  . atrial flutter ablation  5/12   by JA  . CARDIAC CATHETERIZATION    . CARDIOVERSION N/A 10/28/2012   Procedure: CARDIOVERSION;  Surgeon: Burnell Blanks, MD;  Location: [place];  Service: Cardiovascular;  Laterality: N/A;  . ELECTROPHYSIOLOGIC STUDY N/A 08/29/2015   Procedure: Atrial Fibrillation Ablation;  Surgeon: [name], MD;  Location: [place] CV LAB;  Service: Cardiovascular;  Laterality: N/A;  . LEFT HEART CATHETERIZATION WITH CORONARY ANGIOGRAM N/A 06/07/2011   Procedure: LEFT HEART CATHETERIZATION WITH CORONARY ANGIOGRAM;  Surgeon: Peter M Martinique, MD;  Location: [facility] CATH LAB;  Service: Cardiovascular;  Laterality: N/A;  . TOTAL HIP ARTHROPLASTY  06/04/10   RIGHT HIP  . TOTAL KNEE ARTHROPLASTY    . TRANSURETHRAL RESECTION OF PROSTATE  Current Outpatient Prescriptions  Medication Sig Dispense Refill  . acetaminophen (TYLENOL) 500 MG tablet Take 1,000 mg by mouth 2 (two) times daily as needed for moderate pain.    Marland Kitchen atorvastatin (LIPITOR) 10 MG tablet TAKE 1 TABLET BY MOUTH EVERY DAY 90 tablet 1  . Cholecalciferol (VITAMIN D3) 5000 UNITS TABS Take 5,000 Units by mouth See admin instructions. Take 5000 units by mouth every 4 days    . clindamycin (CLEOCIN T) 1 % external solution Apply 1 application topically daily as needed (for bumps in scalp). Apply to scalp daily as needed  2  . dofetilide (TIKOSYN) 500 MCG capsule  TAKE 1 CAPSULE BY MOUTH EVERY 12 HOURS (Patient taking differently: Take 500 mcg by mouth every 12 (twelve) hours. ) 180 capsule 3  . KLOR-CON M20 20 MEQ tablet TAKE 2 TABLETS (40MEQ) BY MOUTH IN THE MORNING AND 2 TABLETS (40MEQ) IN THE EVENING. 360 tablet 0  . methocarbamol (ROBAXIN) 500 MG tablet Take 1 tablet (500 mg total) by mouth at bedtime as needed (pain). 30 tablet 3  . metoprolol succinate (TOPROL-XL) 50 MG 24 hr tablet Take 1 tablet (50mg ) in the morning by mouth and 1/2 tablet (25mg )  at bedtime. 45 tablet 6  . metoprolol tartrate (LOPRESSOR) 25 MG tablet TAKE 1 TABLET BY MOUTH EVERY 6 HOURS AS NEEDED FOR FAST HEART RATES 30 tablet 6  . Multiple Vitamin (MULITIVITAMIN) LIQD Take 5 mLs by mouth daily.    . Multiple Vitamins-Minerals (ICAPS PO) Take 1 tablet by mouth every evening.     . Omega-3 Fatty Acids (OMEGA 3 PO) Take 3 capsules by mouth every evening.     Marland Kitchen omeprazole (PRILOSEC) 20 MG capsule Take 20 mg by mouth daily.     Marland Kitchen OVER THE COUNTER MEDICATION Place 3 drops into both eyes daily as needed (dry eyes). "thera tears"    . rivaroxaban (XARELTO) 20 MG TABS tablet Take 1 tablet (20 mg total) by mouth daily. 90 tablet 1  . tamsulosin (FLOMAX) 0.4 MG CAPS capsule TAKE ONE CAPSULE BY MOUTH EVERY DAY AFTER SAME MEAL 90 capsule 1  . traMADol (ULTRAM) 50 MG tablet Take 1 tablet (50 mg total) by mouth every 6 (six) hours as needed (pain). 60 tablet 5  . vitamin B-12 (CYANOCOBALAMIN) 500 MCG tablet Take 500 mcg by mouth at bedtime.      No current facility-administered medications for this encounter.     Allergies  Allergen Reactions  . Penicillins Itching, Swelling and Other (See Comments)    Has patient had a PCN reaction causing immediate rash, facial/tongue/throat swelling, SOB or lightheadedness with hypotension: Yes Has patient had a PCN reaction causing severe rash involving mucus membranes or skin necrosis: No Has patient had a PCN reaction that required hospitalization  No Has patient had a PCN reaction occurring within the last 10 years: No If all of the above answers are "NO", then may proceed with Cephalosporin use.     Social History   Social History  . Marital status: Married    Spouse name: N/A  . Number of children: N/A  . Years of education: N/A   Occupational History  . Not on file.   Social History Main Topics  . Smoking status: Former Smoker    Packs/day: 1.00    Years: 30.00    Types: Cigarettes    Quit date: 05/06/1978  . Smokeless tobacco: Never Used  . Alcohol use 3.0 oz/week    5 Cans of beer  per week     Comment: he has just about quit  . Drug use: No  . Sexual activity: Not on file   Other Topics Concern  . Not on file   Social History Narrative   he patient lives in [place] with his spouse.  Retired.    Family History  Problem Relation Age of Onset  . Heart attack Father   . Lung disease Mother   . Arrhythmia Sister   . Atrial fibrillation Brother   . Diabetes Neg Hx     ROS- All systems are reviewed and negative except as per the HPI above  Physical Exam: Vitals:   10/17/16 0908  BP: 98/72  Pulse: (!) 58  Weight: 228 lb (103.4 kg)  Height: 6' (1.829 m)    GEN- The patient is well appearing, alert and oriented x 3 today.   Head- normocephalic, atraumatic Eyes-  Sclera clear, conjunctiva pink Ears- hearing intact Oropharynx- clear Neck- supple, no JVP Lymph- no cervical lymphadenopathy Lungs- Clear to ausculation bilaterally, normal work of breathing Heart- Regular rate and rhythm, no murmurs, rubs or gallops, PMI not laterally displaced GI- soft, NT, ND, + BS Extremities- no clubbing, cyanosis, or edema MS- no significant deformity or atrophy Skin- no rash or lesion Psych- euthymic mood, full affect Neuro- strength and sensation are intact  EKG- NSR with RBBB, pr int 160 ms, qrs int 142 ms, qtc 484 ms (stable for pt on tikosyn with RBBB) Epic records reviewed  Assessment and Plan: 1.  PAF Increased afib burden S/p fluuter ablation in 2012 and afib ablation 4/17 Continue tikosyn,xarelto Contiue metoprolol SR 50 am and  25 mg in pm, cannot increase BB dose due to soft BP's (98/72 today) Bmet/mag today Continue cpap  Will request appointment with Dr. Rayann Heman to discuss repeat ablation  Butch Penny C. Maximiliano Cromartie, [facility] [address] [place], [place] [address] [phone]

## 2016-11-04 ENCOUNTER — Encounter: Payer: Self-pay | Admitting: *Deleted

## 2016-11-04 ENCOUNTER — Ambulatory Visit (INDEPENDENT_AMBULATORY_CARE_PROVIDER_SITE_OTHER): Payer: Medicare Other | Admitting: Internal Medicine

## 2016-11-04 ENCOUNTER — Other Ambulatory Visit (INDEPENDENT_AMBULATORY_CARE_PROVIDER_SITE_OTHER): Payer: Medicare Other

## 2016-11-04 ENCOUNTER — Other Ambulatory Visit: Payer: Medicare Other

## 2016-11-04 ENCOUNTER — Encounter: Payer: Self-pay | Admitting: Internal Medicine

## 2016-11-04 VITALS — BP 108/70 | HR 71 | Ht 72.0 in | Wt 223.2 lb

## 2016-11-04 DIAGNOSIS — I10 Essential (primary) hypertension: Secondary | ICD-10-CM

## 2016-11-04 DIAGNOSIS — I519 Heart disease, unspecified: Secondary | ICD-10-CM | POA: Diagnosis not present

## 2016-11-04 DIAGNOSIS — Z01812 Encounter for preprocedural laboratory examination: Secondary | ICD-10-CM | POA: Diagnosis not present

## 2016-11-04 DIAGNOSIS — E559 Vitamin D deficiency, unspecified: Secondary | ICD-10-CM

## 2016-11-04 DIAGNOSIS — I480 Paroxysmal atrial fibrillation: Secondary | ICD-10-CM

## 2016-11-04 DIAGNOSIS — R7301 Impaired fasting glucose: Secondary | ICD-10-CM | POA: Diagnosis not present

## 2016-11-04 DIAGNOSIS — E7800 Pure hypercholesterolemia, unspecified: Secondary | ICD-10-CM

## 2016-11-04 DIAGNOSIS — D508 Other iron deficiency anemias: Secondary | ICD-10-CM | POA: Diagnosis not present

## 2016-11-04 DIAGNOSIS — E042 Nontoxic multinodular goiter: Secondary | ICD-10-CM | POA: Diagnosis not present

## 2016-11-04 DIAGNOSIS — G4733 Obstructive sleep apnea (adult) (pediatric): Secondary | ICD-10-CM | POA: Diagnosis not present

## 2016-11-04 LAB — URINALYSIS, ROUTINE W REFLEX MICROSCOPIC
Bilirubin Urine: NEGATIVE
Ketones, ur: NEGATIVE
NITRITE: NEGATIVE
Total Protein, Urine: NEGATIVE
Urine Glucose: NEGATIVE
Urobilinogen, UA: 0.2 (ref 0.0–1.0)
pH: 5.5 (ref 5.0–8.0)

## 2016-11-04 LAB — COMPREHENSIVE METABOLIC PANEL
ALK PHOS: 52 U/L (ref 39–117)
ALT: 18 U/L (ref 0–53)
AST: 20 U/L (ref 0–37)
Albumin: 4.1 g/dL (ref 3.5–5.2)
BILIRUBIN TOTAL: 1 mg/dL (ref 0.2–1.2)
BUN: 16 mg/dL (ref 6–23)
CO2: 29 meq/L (ref 19–32)
Calcium: 9.3 mg/dL (ref 8.4–10.5)
Chloride: 107 mEq/L (ref 96–112)
Creatinine, Ser: 0.94 mg/dL (ref 0.40–1.50)
GFR: 81.93 mL/min (ref >60.00)
GLUCOSE: 119 mg/dL — AB (ref 70–99)
Potassium: 4.5 mEq/L (ref 3.5–5.1)
SODIUM: 143 meq/L (ref 135–145)
TOTAL PROTEIN: 7 g/dL (ref 6.0–8.3)

## 2016-11-04 LAB — CBC
HCT: 44.8 % (ref 39.0–52.0)
Hemoglobin: 15 g/dL (ref 13.0–17.0)
MCHC: 33.4 g/dL (ref 30.0–36.0)
MCV: 102.2 fl — AB (ref 78.0–100.0)
Platelets: 122 10*3/uL — ABNORMAL LOW (ref 150.0–400.0)
RBC: 4.38 Mil/uL (ref 4.22–5.81)
RDW: 14.5 % (ref 11.5–15.5)
WBC: 5.5 10*3/uL (ref 4.0–10.5)

## 2016-11-04 LAB — VITAMIN D 25 HYDROXY (VIT D DEFICIENCY, FRACTURES): VITD: 48.5 ng/mL (ref 30.00–100.00)

## 2016-11-04 LAB — LIPID PANEL
CHOL/HDL RATIO: 3
Cholesterol: 143 mg/dL (ref 0–200)
HDL: 48.6 mg/dL (ref >39.00)
LDL Cholesterol: 77 mg/dL (ref 0–99)
NONHDL: 93.99
Triglycerides: 85 mg/dL (ref 0.0–149.0)
VLDL: 17 mg/dL (ref 0.0–40.0)

## 2016-11-04 LAB — IBC PANEL
IRON: 82 ug/dL (ref 42–165)
Saturation Ratios: 26.1 % (ref 20.0–50.0)
TRANSFERRIN: 224 mg/dL (ref 212.0–360.0)

## 2016-11-04 LAB — HEMOGLOBIN A1C: HEMOGLOBIN A1C: 5.7 % (ref 4.6–6.5)

## 2016-11-04 LAB — TSH: TSH: 1.55 u[IU]/mL (ref 0.35–4.50)

## 2016-11-04 NOTE — Progress Notes (Signed)
PCP: Elayne Snare, MD  Connor [name] is a 83 y.o. male who presents today for routine electrophysiology followup.  Since last being seen in our clinic, the patient reports doing reasonably well.  He has had increased afib episodes.  [name] confirms afib multiple times per week, lasting up to 12 hours.  He reports fatigue and feeling "washed out" with his afib.  Today, he denies symptoms of palpitations, chest pain, shortness of breath,  lower extremity edema, dizziness, presyncope, or syncope.  The patient is otherwise without complaint today.   Past Medical History:  Diagnosis Date  . Abnormal nuclear cardiac imaging test 1/13   Abnormal stress test 05/2011, but cardiac cath 06/2011 showing normal coronary anatomy.  . Anemia   . Atrial flutter [facility])    s/p ablation in March 2012  . BPH (benign prostatic hyperplasia)   . Chronic anticoagulation    on Xarelto for afib  . DVT (deep venous thrombosis) ([place]) 2/12   in setting of R hip surgery, he developed R femoral DVT  . Hyperlipidemia   . Hypertension   . Obstructive sleep apnea    compliant with CPAP.  managed by Dr Elsworth Soho.  . Osteoarthritis   . Paroxysmal atrial fibrillation ([place]) 9/12   a. documented by Dr Barrett Shell on office EKG 9/12. b. maintained on Tikosyn and Xarelto. c. s/p DCCV 10/2012.  Marland Kitchen Peptic ulcer disease   . Prediabetes   . Premature ventricular contraction   . RBBB (right bundle branch block)   . Vitamin D deficiency    Past Surgical History:  Procedure Laterality Date  . ABLATION OF DYSRHYTHMIC FOCUS  08/29/2015  . atrial flutter ablation  5/12   by JA  . CARDIAC CATHETERIZATION    . CARDIOVERSION N/A 10/28/2012   Procedure: CARDIOVERSION;  Surgeon: Burnell Blanks, MD;  Location: [place];  Service: Cardiovascular;  Laterality: N/A;  . ELECTROPHYSIOLOGIC STUDY N/A 08/29/2015   Procedure: Atrial Fibrillation Ablation;  Surgeon: [name], MD;  Location: [place] CV LAB;  Service: Cardiovascular;  Laterality:  N/A;  . LEFT HEART CATHETERIZATION WITH CORONARY ANGIOGRAM N/A 06/07/2011   Procedure: LEFT HEART CATHETERIZATION WITH CORONARY ANGIOGRAM;  Surgeon: Peter M Martinique, MD;  Location: [facility] CATH LAB;  Service: Cardiovascular;  Laterality: N/A;  . TOTAL HIP ARTHROPLASTY  06/04/10   RIGHT HIP  . TOTAL KNEE ARTHROPLASTY    . TRANSURETHRAL RESECTION OF PROSTATE      ROS- all systems are reviewed and negatives except as per HPI above  Current Outpatient Prescriptions  Medication Sig Dispense Refill  . acetaminophen (TYLENOL) 500 MG tablet Take 1,000 mg by mouth 2 (two) times daily as needed for moderate pain.    Marland Kitchen atorvastatin (LIPITOR) 10 MG tablet TAKE 1 TABLET BY MOUTH EVERY DAY 90 tablet 1  . Cholecalciferol (VITAMIN D3) 5000 UNITS TABS Take 5,000 Units by mouth See admin instructions. Take 5000 units by mouth every 4 days    . clindamycin (CLEOCIN T) 1 % external solution Apply 1 application topically daily as needed (for bumps in scalp). Apply to scalp daily as needed  2  . dofetilide (TIKOSYN) 500 MCG capsule TAKE 1 CAPSULE BY MOUTH EVERY 12 HOURS 180 capsule 3  . KLOR-CON M20 20 MEQ tablet TAKE 2 TABLETS (40MEQ) BY MOUTH IN THE MORNING AND 2 TABLETS (40MEQ) IN THE EVENING. 360 tablet 0  . metoprolol succinate (TOPROL-XL) 50 MG 24 hr tablet Take 1 tablet (50mg ) in the morning by mouth and 1/2  tablet (25mg )  at bedtime. 45 tablet 6  . metoprolol tartrate (LOPRESSOR) 25 MG tablet TAKE 1 TABLET BY MOUTH EVERY 6 HOURS AS NEEDED FOR FAST HEART RATES 30 tablet 6  . Multiple Vitamin (MULITIVITAMIN) LIQD Take 5 mLs by mouth daily.    . Multiple Vitamins-Minerals (ICAPS PO) Take 1 tablet by mouth every evening.     . Omega-3 Fatty Acids (OMEGA 3 PO) Take 3 capsules by mouth every evening.     Marland Kitchen omeprazole (PRILOSEC) 20 MG capsule Take 20 mg by mouth daily.     Marland Kitchen OVER THE COUNTER MEDICATION Place 3 drops into both eyes daily as needed (dry eyes). "thera tears"    . rivaroxaban (XARELTO) 20 MG TABS tablet  Take 1 tablet (20 mg total) by mouth daily. 90 tablet 1  . tamsulosin (FLOMAX) 0.4 MG CAPS capsule TAKE ONE CAPSULE BY MOUTH EVERY DAY AFTER SAME MEAL 90 capsule 1  . traMADol (ULTRAM) 50 MG tablet Take 1 tablet (50 mg total) by mouth every 6 (six) hours as needed (pain). 60 tablet 5  . vitamin B-12 (CYANOCOBALAMIN) 500 MCG tablet Take 500 mcg by mouth at bedtime.      No current facility-administered medications for this visit.     Physical Exam: Vitals:   11/04/16 0857  BP: 108/70  Pulse: 71  SpO2: 97%  Weight: 223 lb 3.2 oz (101.2 kg)  Height: 6' (1.829 m)    GEN- The patient is well appearing, alert and oriented x 3 today.   Head- normocephalic, atraumatic Eyes-  Sclera clear, conjunctiva pink Ears- hearing intact Oropharynx- clear Lungs- Clear to ausculation bilaterally, normal work of breathing Heart- Regular rate and rhythm, no murmurs, rubs or gallops, PMI not laterally displaced GI- soft, NT, ND, + BS Extremities- no clubbing, cyanosis, or edema  EKG tracing ordered today is personally reviewed and shows sinus rhythm 71 bpm, RBBB, QTc 545 msec  Assessment and Plan:  1. Paroxysmal atrial fibrillation Recurrent symptomatic afib  He is s/p ablation 4/17 He has failed medical therapy with tikosyn Therapeutic strategies for afib including medicine and ablation were discussed in detail with the patient today. Risk, benefits, and alternatives to EP study and radiofrequency ablation for afib were also discussed in detail today. These risks include but are not limited to stroke, bleeding, vascular damage, tamponade, perforation, damage to the esophagus, lungs, and other structures, pulmonary vein stenosis, worsening renal function, and death. The patient understands these risk and wishes to proceed.  We will therefore proceed with catheter ablation at the next available time.  Anticipate cardiac CT prior to ablation to exclude LAA thrombus.  The importance of compliance with  xarelto was discussed with him.  chad2vasc is at least 4.  He has also had prior DVT.  Would continue life long anticoagulation  2. OSA Compliance with CPAP is advised  3. HTN Stable No change required today  4. Nonischemic CM Repeat Echo reveals EF 45-50% No changes at this time. Consider addition of losartan in the future No changes today   [name] MD, [facility] 11/04/2016 9:13 AM

## 2016-11-04 NOTE — Patient Instructions (Addendum)
Medication Instructions:    Your physician recommends that you continue on your current medications as directed. Please refer to the Current Medication list given to you today.  - If you need a refill on your cardiac medications before your next appointment, please call your pharmacy.   Labwork:  Your physician recommends that you return for pre procedure lab work between 11/21/16 - 11/29/16 for BMET and CBC w/ diff.  Testing/Procedures: Your physician has requested that you have cardiac CT. Cardiac computed tomography (CT) is a painless test that uses an x-ray machine to take clear, detailed pictures of your heart. For further information please visit [email or web address].   The office will call you to arrange this testing to be done within 7 days prior to your  procedure on 8/2  Your physician has recommended that you have an ablation. Catheter ablation is a medical procedure used to treat some cardiac arrhythmias (irregular heartbeats). During catheter ablation, a long, thin, flexible tube is put into a blood vessel in your groin (upper thigh), or neck. This tube is called an ablation catheter. It is then guided to your heart through the blood vessel. Radio frequency waves destroy small areas of heart tissue where abnormal heartbeats may cause an arrhythmia to start. Please see the instruction sheet given to you today.  Follow-Up:  Your physician recommends that you schedule a follow-up appointment in: 4 weeks, after your procedure on 12/05/16, with Roderic Palau in the AFib clinic.   Your physician recommends that you schedule a follow-up appointment in: 3 months, after your procedure on 12/05/16,  with Dr. Rayann Heman.  Thank you for choosing [facility]!!    Any Other Special Instructions Will Be Listed Below (If Applicable).   Cardiac CT Angiogram A cardiac CT angiogram is a procedure to look at the heart and the area around the heart. It may be done to help find the cause of chest pains  or other symptoms of heart disease. During this procedure, a large X-ray machine, called a CT scanner, takes detailed pictures of the heart and the surrounding area after a dye (contrast material) has been injected into blood vessels in the area. The procedure is also sometimes called a coronary CT angiogram, coronary artery scanning, or CTA. A cardiac CT angiogram allows the health care provider to see how well blood is flowing to and from the heart. The health care provider will be able to see if there are any problems, such as:  Blockage or narrowing of the coronary arteries in the heart.  Fluid around the heart.  Signs of weakness or disease in the muscles, valves, and tissues of the heart.  Tell a health care provider about:  Any allergies you have. This is especially important if you have had a previous allergic reaction to contrast dye.  All medicines you are taking, including vitamins, herbs, eye drops, creams, and over-the-counter medicines.  Any blood disorders you have.  Any surgeries you have had.  Any medical conditions you have.  Whether you are pregnant or may be pregnant.  Any anxiety disorders, chronic pain, or other conditions you have that may increase your stress or prevent you from lying still. What are the risks? Generally, this is a safe procedure. However, problems may occur, including:  Bleeding.  Infection.  Allergic reactions to medicines or dyes.  Damage to other structures or organs.  Kidney damage from the dye or contrast that is used.  Increased risk of cancer from radiation exposure. This risk is  low. Talk with your health care provider about: ? The risks and benefits of testing. ? How you can receive the lowest dose of radiation.  What happens before the procedure?  Wear comfortable clothing and remove any jewelry, glasses, dentures, and hearing aids.  Follow instructions from your health care provider about eating and drinking. This may  include: ? For 12 hours before the test - avoid caffeine. This includes tea, coffee, soda, energy drinks, and diet pills. Drink plenty of water or other fluids that do not have caffeine in them. Being well-hydrated can prevent complications. ? For 4-6 hours before the test - stop eating and drinking. The contrast dye can cause nausea, but this is less likely if your stomach is empty.  Ask your health care provider about changing or stopping your regular medicines. This is especially important if you are taking diabetes medicines, blood thinners, or medicines to treat erectile dysfunction. What happens during the procedure?  Hair on your chest may need to be removed so that small sticky patches called electrodes can be placed on your chest. These will transmit information that helps to monitor your heart during the test.  An IV tube will be inserted into one of your veins.  You might be given a medicine to control your heart rate during the test. This will help to ensure that good images are obtained.  You will be asked to lie on an exam table. This table will slide in and out of the CT machine during the procedure.  Contrast dye will be injected into the IV tube. You might feel warm, or you may get a metallic taste in your mouth.  You will be given a medicine (nitroglycerin) to relax (dilate) the arteries in your heart.  The table that you are lying on will move into the CT machine tunnel for the scan.  The person running the machine will give you instructions while the scans are being done. You may be asked to: ? Keep your arms above your head. ? Hold your breath. ? Stay very still, even if the table is moving.  When the scanning is complete, you will be moved out of the machine.  The IV tube will be removed. The procedure may vary among health care providers and hospitals. What happens after the procedure?  You might feel warm, or you may get a metallic taste in your mouth from the  contrast dye.  You may have a headache from the nitroglycerin.  After the procedure, drink water or other fluids to wash (flush) the contrast material out of your body.  Contact a health care provider if you have any symptoms of allergy to the contrast. These symptoms include: ? Shortness of breath. ? Rash or hives. ? A racing heartbeat.  Most people can return to their normal activities right after the procedure. Ask your health care provider what activities are safe for you.  It is up to you to get the results of your procedure. Ask your health care provider, or the department that is doing the procedure, when your results will be ready. Summary  A cardiac CT angiogram is a procedure to look at the heart and the area around the heart. It may be done to help find the cause of chest pains or other symptoms of heart disease.  During this procedure, a large X-ray machine, called a CT scanner, takes detailed pictures of the heart and the surrounding area after a dye (contrast material) has been injected  into blood vessels in the area.  Ask your health care provider about changing or stopping your regular medicines before the procedure. This is especially important if you are taking diabetes medicines, blood thinners, or medicines to treat erectile dysfunction.  After the procedure, drink water or other fluids to wash (flush) the contrast material out of your body. This information is not intended to replace advice given to you by your health care provider. Make sure you discuss any questions you have with your health care provider. Document Released: 04/04/2008 Document Revised: 03/11/2016 Document Reviewed: 03/11/2016 Elsevier Interactive Patient Education  2017 [employer].

## 2016-11-07 NOTE — Progress Notes (Signed)
Patient ID: Connor [name], male   DOB: 1933-07-11, 83 y.o.   MRN: [id number]     Subjective:   Patient here for annual preventive exam and management of chronic problems.    Risk factors:     Increasing age , now over 50  History of mild impaired fasting glucose levels   Mild obesity: He has had difficulty losing weight   Hypercholesterolemia  Recurrent atrial fibrillation  Sleep apnea  Mild systolic LV dysfunction   PREVENTIVE CARE:   Diet: Used to trying to reduce portions, eating more low fat meals  Exercise: Goes to the gym using bike, weights,bike/ treadmill up to 40 minutes, 2-3/7 days a week.  Has not been consistent with this   History of falls: Only once, accidental   Annual hemoccults:           2016  vitamin D3 supplement:                               5000 units 2/7   Colonoscopy/sigmoidoscopy 2011  Prevnar  2015  Yearly flu vaccine:                      yes    Zostavax:  2008   Pneumovax:  2000  Tetanus booster:   ? 1992     Lab Results  Component Value Date   OCCULTBLD NEGATIVE 04/20/2016   OCCULTBLD Negative 10/06/2014   OCCULTBLD Negative 09/16/2013    Lipid Management, last levels: Has been maintained on Lipitor 10 mg for several years for hypercholesterolemia with baseline LDL levels up to 189  Lab Results  Component Value Date   CHOL 143 11/04/2016   HDL 48.60 11/04/2016   LDLCALC 77 11/04/2016   TRIG 85.0 11/04/2016   CHOLHDL 3 11/04/2016     Activities of Daily Living: In his present state of health, has no difficulty performing daily activities   Home Safety: Has smoke detector and wears seat belts.   Depression Screen  See relevant section in EMR   Review of Systems:    Weight gain: He has difficulty losing the weight in the past Now maintaining a relatively good level   Wt Readings from Last 3 Encounters:  11/08/16 222 lb 3.2 oz (100.8 kg)  11/04/16 223 lb 3.2 oz (101.2 kg)  10/17/16 228 lb (103.4 kg)       Has  history of mild hypertension, treated with metoprolol ER 50 mg daily Taking this partly because of a history of atrial fibrillation   Checking blood pressure almost daily at home  Blood sugar readings are fluctuating but not consistently high or low Using an arm monitor that is linked to his smart phone     Eyes: Normal, followed by ophthalmologist every 6 months.        ENT: Decreased hearing especially if background noise present. Using bilateral hearing aids with fair results                   Thyroid:     Has had a small multinodular goiter since at least 1998, has been euthyroid  Lab Results  Component Value Date   TSH 1.55 11/04/2016       No chest pain on exertion.                      Has periodic palpitations from recurrent atrial fibrillation. is Continuing  to be taking Tikosyn and is on Xarelto followed by cardiologist regularly He now is using portable device to record his EKG rhythm strip linked to his smart phone He gets fatigue when he has episodes when he has fast atrial fibrillation and is due to get another ablation done next month      No swelling of legs.       No shortness of breath on exertion.     Bowel habits: Fairly regular   Taking OTC Prilosec mostly prophylactically because of remote history of GI bleed possibly related to Daypro  No heartburn recently Hemoccult normal in 12/17     Rectal bleeding/black stools: Not present.   Prior history of meatal stenosis and doing self-catheterization rarely, no difficulty with urine flow now and continues on Flomax for BPH from urologist   Nocturia occasionally Has no  frequency/urgency.   PSA 1.5 and is being checked by urologist annually at least     Neurological: Episodes of lightheadedness/dizziness since about 2000.     Periodically he will feel symptoms of slightly dizzy but not swimmy headed and a  sensation of being off balance This may be worsened when he has neck pain Again recently had been  sent for physical therapy for the cervical spine.   He thinks that when he presses on the back of the neck on the muscle this relieves his symptoms  No numbness or tingling in his feet  Has had back pain from degenerated discs at L2-3, 3-4 and 4-5 levels and scoliosis. Better recently, takes tramadol only occasionally   Neck pain: This is recently better after treatment, using and contour pillow and occasionally home traction Takes Tylenol in the morning      Allergies as of 11/08/2016      Reactions   Penicillins Itching, Swelling, Other (See Comments)   Has patient had a PCN reaction causing immediate rash, facial/tongue/throat swelling, SOB or lightheadedness with hypotension: Yes Has patient had a PCN reaction causing severe rash involving mucus membranes or skin necrosis: No Has patient had a PCN reaction that required hospitalization No Has patient had a PCN reaction occurring within the last 10 years: No If all of the above answers are "NO", then may proceed with Cephalosporin use.      Medication List       Accurate as of 11/08/16 11:16 AM. Always use your most recent med list.          acetaminophen 500 MG tablet Commonly known as:  TYLENOL Take 1,000 mg by mouth 2 (two) times daily as needed for moderate pain.   atorvastatin 10 MG tablet Commonly known as:  LIPITOR TAKE 1 TABLET BY MOUTH EVERY DAY   clindamycin 1 % external solution Commonly known as:  CLEOCIN T Apply 1 application topically daily as needed (for bumps in scalp). Apply to scalp daily as needed   dofetilide 500 MCG capsule Commonly known as:  TIKOSYN TAKE 1 CAPSULE BY MOUTH EVERY 12 HOURS   ICAPS PO Take 1 tablet by mouth every evening.   KLOR-CON M20 20 MEQ tablet Generic drug:  potassium chloride SA TAKE 2 TABLETS (40MEQ) BY MOUTH IN THE MORNING AND 2 TABLETS (40MEQ) IN THE EVENING.   metoprolol succinate 50 MG 24 hr tablet Commonly known as:  TOPROL-XL Take 1 tablet (50mg ) in the morning  by mouth and 1/2 tablet (25mg )  at bedtime.   metoprolol tartrate 25 MG tablet Commonly known as:  LOPRESSOR TAKE 1 TABLET BY  MOUTH EVERY 6 HOURS AS NEEDED FOR FAST HEART RATES   multivitamin Liqd Take 5 mLs by mouth daily.   OMEGA 3 PO Take 3 capsules by mouth every evening.   omeprazole 20 MG capsule Commonly known as:  PRILOSEC Take 20 mg by mouth daily.   OVER THE COUNTER MEDICATION Place 3 drops into both eyes daily as needed (dry eyes). "thera tears"   rivaroxaban 20 MG Tabs tablet Commonly known as:  XARELTO Take 1 tablet (20 mg total) by mouth daily.   tamsulosin 0.4 MG Caps capsule Commonly known as:  FLOMAX TAKE ONE CAPSULE BY MOUTH EVERY DAY AFTER SAME MEAL   traMADol 50 MG tablet Commonly known as:  ULTRAM Take 1 tablet (50 mg total) by mouth every 6 (six) hours as needed (pain).   vitamin B-12 500 MCG tablet Commonly known as:  CYANOCOBALAMIN Take 500 mcg by mouth at bedtime.   Vitamin D3 5000 units Tabs Take 5,000 Units by mouth See admin instructions. Take 5000 units by mouth every 4 days       Allergies:  Allergies  Allergen Reactions  . Penicillins Itching, Swelling and Other (See Comments)    Has patient had a PCN reaction causing immediate rash, facial/tongue/throat swelling, SOB or lightheadedness with hypotension: Yes Has patient had a PCN reaction causing severe rash involving mucus membranes or skin necrosis: No Has patient had a PCN reaction that required hospitalization No Has patient had a PCN reaction occurring within the last 10 years: No If all of the above answers are "NO", then may proceed with Cephalosporin use.     Past Medical History:  Diagnosis Date  . Abnormal nuclear cardiac imaging test 1/13   Abnormal stress test 05/2011, but cardiac cath 06/2011 showing normal coronary anatomy.  . Anemia   . Atrial flutter [facility])    s/p ablation in March 2012  . BPH (benign prostatic hyperplasia)   . Chronic anticoagulation    on  Xarelto for afib  . DVT (deep venous thrombosis) ([place]) 2/12   in setting of R hip surgery, he developed R femoral DVT  . Hyperlipidemia   . Hypertension   . Obstructive sleep apnea    compliant with CPAP.  managed by Dr Elsworth Soho.  . Osteoarthritis   . Paroxysmal atrial fibrillation ([place]) 9/12   a. documented by Dr Barrett Shell on office EKG 9/12. b. maintained on Tikosyn and Xarelto. c. s/p DCCV 10/2012.  Marland Kitchen Peptic ulcer disease   . Prediabetes   . Premature ventricular contraction   . RBBB (right bundle branch block)   . Vitamin D deficiency     Past Surgical History:  Procedure Laterality Date  . ABLATION OF DYSRHYTHMIC FOCUS  08/29/2015  . atrial flutter ablation  5/12   by JA  . CARDIAC CATHETERIZATION    . CARDIOVERSION N/A 10/28/2012   Procedure: CARDIOVERSION;  Surgeon: Burnell Blanks, MD;  Location: [place];  Service: Cardiovascular;  Laterality: N/A;  . ELECTROPHYSIOLOGIC STUDY N/A 08/29/2015   Procedure: Atrial Fibrillation Ablation;  Surgeon: [name], MD;  Location: [place] CV LAB;  Service: Cardiovascular;  Laterality: N/A;  . LEFT HEART CATHETERIZATION WITH CORONARY ANGIOGRAM N/A 06/07/2011   Procedure: LEFT HEART CATHETERIZATION WITH CORONARY ANGIOGRAM;  Surgeon: Peter M Martinique, MD;  Location: [facility] CATH LAB;  Service: Cardiovascular;  Laterality: N/A;  . TOTAL HIP ARTHROPLASTY  06/04/10   RIGHT HIP  . TOTAL KNEE ARTHROPLASTY    . TRANSURETHRAL RESECTION OF PROSTATE  Family History  Problem Relation Age of Onset  . Heart attack Father   . Lung disease Mother   . Arrhythmia Sister   . Atrial fibrillation Brother   . Diabetes Neg Hx     Social History:  reports that he quit smoking about 38 years ago. His smoking use included Cigarettes. He has a 30.00 pack-year smoking history. He has never used smokeless tobacco. He reports that he drinks about 3.0 oz of alcohol per week . He reports that he does not use drugs.    EXAM:  BP 124/78   Pulse (!) 58   Ht 6'  (1.829 m)   Wt 222 lb 3.2 oz (100.8 kg)   SpO2 96%   BMI 30.14 kg/m    GENERAL:  Large build, well-nourished, Normal communication, well looking   No pallor, clubbing, cervical lymphadenopathy or peripheral edema.    Skin:  no rash or unusual pigmentation.   EYES:  Externally normal.  Fundii:  normal discs and vessels, limited exam On the left  ENT:  oral cavity and pharynx normal.   THYROID:  Not clearly palpable  CAROTIDS:  Normal character; no bruit.  HEART: Heart rate regular with intermittent missed beats  Normal apex, S1 normal, S2 normal, no S3/S4 ; no murmur.  CHEST:  Normal shape.  Lungs:  Vescicular breath sounds bilaterally.  No crepitations/ wheeze.  ABDOMEN:  No distention. no abnormal epigastric pulsation.   Liver and spleen not palpable.  No other mass or tenderness.  RECTAL exam :   Deferred to urologist  NEUROLOGICAL: Gait normal  Romberg sign:normal  Biceps reflexes are normal, and ankle reflexes absent   SPINE AND JOINTS:  Mild scoliosis lumbar spine with some prominence of the L2-L3 spines. Has lateral deviation of great toe joints bilaterally    PERIPHERAL PULSES: pedal pulses normal    Assessment/Plan:     Discussed today appropriate screening and preventive services including:   Diabetes screening Done, will need to be repeated in 3 months Nutrition counseling : low fat diet  Vaccines: Will give him tetanus booster/DTaP, otherwise all up to date  Screening for colon cancer:   Colonoscopy not indicated at his age, also would be difficult to do with his taking anticoagulants Last stool Hemoccult normal in December  Needs followup for annual physical exam once a year Resume regular exercise regimen at least 3 days a week when allowed by cardiologist  History of anemia previously related to bleeding episode: Resolved  Active medical problems and recommendations:  Hyperlipidemia: His lipids are consistently well controlled with LDL 77 on  Lipitor 10 mg daily.  He will continue this   History of impaired fasting glucose: Glucose is 119 fasting which is higher than usual.  However A1c is still normal at 5.7 indicating primarily fasting hyperglycemia without diabetes and stable level of control.  He needs continuing efforts to keep his weight down and exercise regularly.  Will recheck in 3 months, currently not a candidate for metformin   Hypertension: Blood pressure is consistently well controlled with metoprolol alone.  He is also monitoring at home appears with an arm cuff although getting somewhat erratically readings at times   Dizziness/lightheadedness: This is chronic and likely  related to cervical spondylosis with spur formation and vascular compromise occasionally.  Romberg sign negative.  His symptoms are relatively mild and usually treated with physical therapy occasional needed for muscle relaxants    BPH and history of meatal stenosis: Doing well symptomatically.  To continue followup with urologist annually  Atrial fibrillation: Recurrent, apparently needing another ablation procedure.  Heart rate usually controlled with metoprolol   Small multinodular goiter:  Not clearly palpable now and TSH normal now    History of vitamin D deficiency: Has been stable with low-dose supplementation and will continue.  Although he had a mild decrease in calcium this is back to normal now   Chronic PPI therapy: He can continue this although may need to do this only every other day, probably needs prophylaxis because of continuing Xarelto  Total visit time for evaluation and management of active medical problems, review of medical records, review of home blood pressure and EKG records, counseling, evaluation and discussion about Labs and medications = 25 minutes   Chad Tiznado 11/08/2016, 11:16 AM

## 2016-11-08 ENCOUNTER — Encounter: Payer: Self-pay | Admitting: Endocrinology

## 2016-11-08 ENCOUNTER — Ambulatory Visit (INDEPENDENT_AMBULATORY_CARE_PROVIDER_SITE_OTHER): Payer: Medicare Other | Admitting: Endocrinology

## 2016-11-08 VITALS — BP 124/78 | HR 58 | Ht 72.0 in | Wt 222.2 lb

## 2016-11-08 DIAGNOSIS — Z0000 Encounter for general adult medical examination without abnormal findings: Secondary | ICD-10-CM

## 2016-11-08 DIAGNOSIS — I10 Essential (primary) hypertension: Secondary | ICD-10-CM

## 2016-11-08 DIAGNOSIS — R42 Dizziness and giddiness: Secondary | ICD-10-CM | POA: Diagnosis not present

## 2016-11-08 DIAGNOSIS — R7301 Impaired fasting glucose: Secondary | ICD-10-CM | POA: Diagnosis not present

## 2016-11-08 DIAGNOSIS — E559 Vitamin D deficiency, unspecified: Secondary | ICD-10-CM | POA: Diagnosis not present

## 2016-11-08 DIAGNOSIS — Z23 Encounter for immunization: Secondary | ICD-10-CM

## 2016-11-08 DIAGNOSIS — E7800 Pure hypercholesterolemia, unspecified: Secondary | ICD-10-CM | POA: Diagnosis not present

## 2016-11-17 ENCOUNTER — Encounter: Payer: Self-pay | Admitting: Internal Medicine

## 2016-11-19 ENCOUNTER — Other Ambulatory Visit: Payer: Self-pay | Admitting: *Deleted

## 2016-11-19 DIAGNOSIS — I4891 Unspecified atrial fibrillation: Secondary | ICD-10-CM

## 2016-11-20 ENCOUNTER — Encounter: Payer: Self-pay | Admitting: Internal Medicine

## 2016-11-21 ENCOUNTER — Other Ambulatory Visit: Payer: Medicare Other | Admitting: *Deleted

## 2016-11-21 DIAGNOSIS — I480 Paroxysmal atrial fibrillation: Secondary | ICD-10-CM

## 2016-11-21 DIAGNOSIS — Z01812 Encounter for preprocedural laboratory examination: Secondary | ICD-10-CM

## 2016-11-21 LAB — CBC WITH DIFFERENTIAL/PLATELET
Basophils Absolute: 0 10*3/uL (ref 0.0–0.2)
Basos: 0 %
EOS (ABSOLUTE): 0.3 10*3/uL (ref 0.0–0.4)
EOS: 6 %
HEMATOCRIT: 42.2 % (ref 37.5–51.0)
Hemoglobin: 14.4 g/dL (ref 13.0–17.7)
Immature Grans (Abs): 0 10*3/uL (ref 0.0–0.1)
Immature Granulocytes: 0 %
LYMPHS ABS: 1.3 10*3/uL (ref 0.7–3.1)
Lymphs: 28 %
MCH: 34.1 pg — ABNORMAL HIGH (ref 26.6–33.0)
MCHC: 34.1 g/dL (ref 31.5–35.7)
MCV: 100 fL — ABNORMAL HIGH (ref 79–97)
MONOS ABS: 0.7 10*3/uL (ref 0.1–0.9)
Monocytes: 14 %
Neutrophils Absolute: 2.4 10*3/uL (ref 1.4–7.0)
Neutrophils: 52 %
Platelets: 129 10*3/uL — ABNORMAL LOW (ref 150–379)
RBC: 4.22 x10E6/uL (ref 4.14–5.80)
RDW: 14.3 % (ref 12.3–15.4)
WBC: 4.7 10*3/uL (ref 3.4–10.8)

## 2016-11-21 LAB — BASIC METABOLIC PANEL
BUN / CREAT RATIO: 22 (ref 10–24)
BUN: 18 mg/dL (ref 8–27)
CHLORIDE: 107 mmol/L — AB (ref 96–106)
CO2: 23 mmol/L (ref 20–29)
CREATININE: 0.82 mg/dL (ref 0.76–1.27)
Calcium: 8.7 mg/dL (ref 8.6–10.2)
GFR calc Af Amer: 97 mL/min/{1.73_m2} (ref >59)
GFR calc non Af Amer: 84 mL/min/{1.73_m2} (ref >59)
GLUCOSE: 115 mg/dL — AB (ref 65–99)
POTASSIUM: 4.3 mmol/L (ref 3.5–5.2)
SODIUM: 145 mmol/L — AB (ref 134–144)

## 2016-12-02 ENCOUNTER — Encounter (HOSPITAL_COMMUNITY): Payer: Self-pay

## 2016-12-02 ENCOUNTER — Ambulatory Visit (HOSPITAL_COMMUNITY)
Admission: RE | Admit: 2016-12-02 | Discharge: 2016-12-02 | Disposition: A | Discharge status: Home / Self Care | Payer: Medicare Other | Source: Ambulatory Visit | Attending: Internal Medicine | Admitting: Internal Medicine

## 2016-12-02 DIAGNOSIS — I288 Other diseases of pulmonary vessels: Secondary | ICD-10-CM | POA: Diagnosis not present

## 2016-12-02 DIAGNOSIS — J984 Other disorders of lung: Secondary | ICD-10-CM | POA: Insufficient documentation

## 2016-12-02 DIAGNOSIS — I4891 Unspecified atrial fibrillation: Secondary | ICD-10-CM

## 2016-12-02 DIAGNOSIS — R938 Abnormal findings on diagnostic imaging of other specified body structures: Secondary | ICD-10-CM | POA: Insufficient documentation

## 2016-12-02 MED ORDER — NITROGLYCERIN 0.4 MG SL SUBL
0.4000 mg | SUBLINGUAL_TABLET | SUBLINGUAL | Status: DC | PRN
  Filled 2016-12-02: qty 25

## 2016-12-02 MED ORDER — IOPAMIDOL (ISOVUE-370) INJECTION 76%
INTRAVENOUS | Status: AC
  Administered 2016-12-02: 80 mL
  Filled 2016-12-02: qty 100

## 2016-12-02 MED ORDER — NITROGLYCERIN 0.4 MG SL SUBL
SUBLINGUAL_TABLET | SUBLINGUAL | Status: AC
  Administered 2016-12-02: 0.4 mg
  Filled 2016-12-02: qty 1

## 2016-12-04 ENCOUNTER — Other Ambulatory Visit: Payer: Self-pay | Admitting: Endocrinology

## 2016-12-04 ENCOUNTER — Other Ambulatory Visit: Payer: Self-pay | Admitting: Internal Medicine

## 2016-12-04 ENCOUNTER — Encounter (HOSPITAL_COMMUNITY): Payer: Self-pay | Admitting: Anesthesiology

## 2016-12-04 NOTE — Anesthesia Preprocedure Evaluation (Addendum)
Anesthesia Evaluation  Patient identified by MRN, date of birth, ID band Patient awake    Reviewed: Allergy & Precautions, NPO status , Patient's Chart, lab work & pertinent test results, reviewed documented beta blocker date and time   Airway Mallampati: I  TM Distance: >3 FB Neck ROM: Full    Dental  (+) Teeth Intact, Caps   Pulmonary sleep apnea and Continuous Positive Airway Pressure Ventilation , former smoker,    Pulmonary exam normal breath sounds clear to auscultation       Cardiovascular hypertension, Pt. on medications and Pt. on home beta blockers + DVT  Normal cardiovascular exam+ dysrhythmias Atrial Fibrillation  Rhythm:Irregular Rate:Normal  Hx/o DVT s/p hip surgery    Neuro/Psych  Neuromuscular disease negative psych ROS   GI/Hepatic PUD, GERD  Medicated and Controlled,  Endo/Other  Obesity Hyperlipidemia   Renal/GU    BPH     Musculoskeletal  (+) Arthritis , Osteoarthritis,    Abdominal   Peds  Hematology  (+) anemia , Anticoagulated    Anesthesia Other Findings   Reproductive/Obstetrics                            Anesthesia Physical Anesthesia Plan  ASA: III  Anesthesia Plan: General   Post-op Pain Management:    Induction: Intravenous  PONV Risk Score and Plan: 2 and Ondansetron, Dexamethasone, Treatment may vary due to age or medical condition and Promethazine  Airway Management Planned: LMA  Additional Equipment:   Intra-op Plan:   Post-operative Plan: Extubation in OR  Informed Consent: I have reviewed the patients History and Physical, chart, labs and discussed the procedure including the risks, benefits and alternatives for the proposed anesthesia with the patient or authorized representative who has indicated his/her understanding and acceptance.   Dental advisory given  Plan Discussed with: Anesthesiologist, Surgeon and CRNA  Anesthesia Plan  Comments:        Anesthesia Quick Evaluation

## 2016-12-05 ENCOUNTER — Encounter (HOSPITAL_COMMUNITY)
Admission: RE | Disposition: A | Discharge status: Home / Self Care | Payer: Self-pay | Source: Ambulatory Visit | Attending: Internal Medicine

## 2016-12-05 ENCOUNTER — Ambulatory Visit (HOSPITAL_COMMUNITY): Payer: Medicare Other | Admitting: Anesthesiology

## 2016-12-05 ENCOUNTER — Encounter (HOSPITAL_COMMUNITY): Payer: Self-pay | Admitting: Anesthesiology

## 2016-12-05 ENCOUNTER — Ambulatory Visit (HOSPITAL_COMMUNITY)
Admission: RE | Admit: 2016-12-05 | Discharge: 2016-12-06 | Disposition: A | Discharge status: Home / Self Care | Payer: Medicare Other | Source: Ambulatory Visit | Attending: Internal Medicine | Admitting: Internal Medicine

## 2016-12-05 DIAGNOSIS — Z79899 Other long term (current) drug therapy: Secondary | ICD-10-CM | POA: Insufficient documentation

## 2016-12-05 DIAGNOSIS — Z96641 Presence of right artificial hip joint: Secondary | ICD-10-CM | POA: Diagnosis not present

## 2016-12-05 DIAGNOSIS — M1990 Unspecified osteoarthritis, unspecified site: Secondary | ICD-10-CM | POA: Diagnosis not present

## 2016-12-05 DIAGNOSIS — N400 Enlarged prostate without lower urinary tract symptoms: Secondary | ICD-10-CM | POA: Diagnosis not present

## 2016-12-05 DIAGNOSIS — Z7983 Long term (current) use of bisphosphonates: Secondary | ICD-10-CM | POA: Insufficient documentation

## 2016-12-05 DIAGNOSIS — E785 Hyperlipidemia, unspecified: Secondary | ICD-10-CM | POA: Diagnosis not present

## 2016-12-05 DIAGNOSIS — G4733 Obstructive sleep apnea (adult) (pediatric): Secondary | ICD-10-CM | POA: Diagnosis not present

## 2016-12-05 DIAGNOSIS — I429 Cardiomyopathy, unspecified: Secondary | ICD-10-CM | POA: Insufficient documentation

## 2016-12-05 DIAGNOSIS — I10 Essential (primary) hypertension: Secondary | ICD-10-CM | POA: Diagnosis not present

## 2016-12-05 DIAGNOSIS — E669 Obesity, unspecified: Secondary | ICD-10-CM | POA: Diagnosis not present

## 2016-12-05 DIAGNOSIS — Z86718 Personal history of other venous thrombosis and embolism: Secondary | ICD-10-CM | POA: Diagnosis not present

## 2016-12-05 DIAGNOSIS — I480 Paroxysmal atrial fibrillation: Secondary | ICD-10-CM | POA: Insufficient documentation

## 2016-12-05 DIAGNOSIS — Z7901 Long term (current) use of anticoagulants: Secondary | ICD-10-CM | POA: Insufficient documentation

## 2016-12-05 DIAGNOSIS — R7303 Prediabetes: Secondary | ICD-10-CM | POA: Insufficient documentation

## 2016-12-05 DIAGNOSIS — Z6831 Body mass index (BMI) 31.0-31.9, adult: Secondary | ICD-10-CM | POA: Diagnosis not present

## 2016-12-05 DIAGNOSIS — Z87891 Personal history of nicotine dependence: Secondary | ICD-10-CM | POA: Diagnosis not present

## 2016-12-05 DIAGNOSIS — K219 Gastro-esophageal reflux disease without esophagitis: Secondary | ICD-10-CM | POA: Insufficient documentation

## 2016-12-05 HISTORY — PX: ATRIAL FIBRILLATION ABLATION: EP1191

## 2016-12-05 LAB — POCT ACTIVATED CLOTTING TIME: Activated Clotting Time: 186 seconds

## 2016-12-05 SURGERY — ATRIAL FIBRILLATION ABLATION
Anesthesia: General

## 2016-12-05 MED ORDER — FENTANYL CITRATE (PF) 100 MCG/2ML IJ SOLN
INTRAMUSCULAR | Status: DC | PRN
  Administered 2016-12-05 (×4): 25 ug via INTRAVENOUS

## 2016-12-05 MED ORDER — TRAMADOL HCL 50 MG PO TABS: 50.0000 mg | ORAL_TABLET | Freq: Four times a day (QID) | ORAL | Status: DC | PRN

## 2016-12-05 MED ORDER — ADENOSINE 6 MG/2ML IV SOLN
INTRAVENOUS | Status: DC | PRN
  Administered 2016-12-05 (×2): 12 mg via INTRAVENOUS

## 2016-12-05 MED ORDER — LIDOCAINE HCL (PF) 1 % IJ SOLN
INTRAMUSCULAR | Status: DC | PRN
  Administered 2016-12-05: 20 mL

## 2016-12-05 MED ORDER — SODIUM CHLORIDE 0.9 % IV SOLN
INTRAVENOUS | Status: DC
  Administered 2016-12-05 (×3): via INTRAVENOUS

## 2016-12-05 MED ORDER — HEPARIN SODIUM (PORCINE) 1000 UNIT/ML IJ SOLN
INTRAMUSCULAR | Status: DC | PRN
  Administered 2016-12-05: 12000 [IU] via INTRAVENOUS
  Administered 2016-12-05: 2000 [IU] via INTRAVENOUS

## 2016-12-05 MED ORDER — HEPARIN SODIUM (PORCINE) 1000 UNIT/ML IJ SOLN
INTRAMUSCULAR | Status: AC
  Filled 2016-12-05: qty 1

## 2016-12-05 MED ORDER — ADENOSINE 6 MG/2ML IV SOLN
INTRAVENOUS | Status: AC
  Filled 2016-12-05: qty 8

## 2016-12-05 MED ORDER — IOPAMIDOL (ISOVUE-370) INJECTION 76%
INTRAVENOUS | Status: AC
  Filled 2016-12-05: qty 50

## 2016-12-05 MED ORDER — PANTOPRAZOLE SODIUM 40 MG PO TBEC
40.0000 mg | DELAYED_RELEASE_TABLET | Freq: Every day | ORAL | Status: DC
  Administered 2016-12-05 – 2016-12-06 (×2): 40 mg via ORAL
  Filled 2016-12-05 (×2): qty 1

## 2016-12-05 MED ORDER — DEXAMETHASONE SODIUM PHOSPHATE 10 MG/ML IJ SOLN
INTRAMUSCULAR | Status: DC | PRN
  Administered 2016-12-05: 10 mg via INTRAVENOUS

## 2016-12-05 MED ORDER — SODIUM CHLORIDE 0.9% FLUSH: 3.0000 mL | INTRAVENOUS | Status: DC | PRN

## 2016-12-05 MED ORDER — IOPAMIDOL (ISOVUE-370) INJECTION 76%
INTRAVENOUS | Status: DC | PRN
  Administered 2016-12-05: 2 mL

## 2016-12-05 MED ORDER — HYDROCODONE-ACETAMINOPHEN 5-325 MG PO TABS
ORAL_TABLET | ORAL | Status: AC
  Filled 2016-12-05: qty 1

## 2016-12-05 MED ORDER — ONDANSETRON HCL 4 MG/2ML IJ SOLN: 4.0000 mg | Freq: Four times a day (QID) | INTRAMUSCULAR | Status: DC | PRN

## 2016-12-05 MED ORDER — EPHEDRINE SULFATE 50 MG/ML IJ SOLN
INTRAMUSCULAR | Status: DC | PRN
  Administered 2016-12-05: 10 mg via INTRAVENOUS
  Administered 2016-12-05 (×2): 5 mg via INTRAVENOUS

## 2016-12-05 MED ORDER — LIDOCAINE HCL (PF) 1 % IJ SOLN
INTRAMUSCULAR | Status: AC
  Filled 2016-12-05: qty 30

## 2016-12-05 MED ORDER — DOFETILIDE 500 MCG PO CAPS
500.0000 ug | ORAL_CAPSULE | Freq: Two times a day (BID) | ORAL | Status: DC
  Administered 2016-12-05 – 2016-12-06 (×2): 500 ug via ORAL
  Filled 2016-12-05 (×2): qty 1

## 2016-12-05 MED ORDER — METOPROLOL SUCCINATE ER 25 MG PO TB24
25.0000 mg | ORAL_TABLET | Freq: Every day | ORAL | Status: DC
  Administered 2016-12-05: 23:00:00 25 mg via ORAL
  Filled 2016-12-05: qty 1

## 2016-12-05 MED ORDER — SODIUM CHLORIDE 0.9% FLUSH
3.0000 mL | Freq: Two times a day (BID) | INTRAVENOUS | Status: DC
  Administered 2016-12-05: 3 mL via INTRAVENOUS

## 2016-12-05 MED ORDER — TAMSULOSIN HCL 0.4 MG PO CAPS
0.4000 mg | ORAL_CAPSULE | Freq: Every day | ORAL | Status: DC
  Administered 2016-12-05: 0.4 mg via ORAL
  Filled 2016-12-05: qty 1

## 2016-12-05 MED ORDER — PROPOFOL 10 MG/ML IV BOLUS
INTRAVENOUS | Status: DC | PRN
  Administered 2016-12-05: 100 mg via INTRAVENOUS
  Administered 2016-12-05: 40 mg via INTRAVENOUS
  Administered 2016-12-05 (×2): 30 mg via INTRAVENOUS

## 2016-12-05 MED ORDER — LIDOCAINE HCL (CARDIAC) 20 MG/ML IV SOLN
INTRAVENOUS | Status: DC | PRN
  Administered 2016-12-05: 60 mg via INTRAVENOUS

## 2016-12-05 MED ORDER — PROTAMINE SULFATE 10 MG/ML IV SOLN
INTRAVENOUS | Status: DC | PRN
  Administered 2016-12-05: 30 mg via INTRAVENOUS

## 2016-12-05 MED ORDER — DOCUSATE SODIUM 100 MG PO CAPS
100.0000 mg | ORAL_CAPSULE | Freq: Two times a day (BID) | ORAL | Status: DC | PRN
Start: 2016-12-05 — End: 2016-12-06
  Administered 2016-12-05: 23:00:00 100 mg via ORAL
  Filled 2016-12-05: qty 1

## 2016-12-05 MED ORDER — ONDANSETRON HCL 4 MG/2ML IJ SOLN
INTRAMUSCULAR | Status: DC | PRN
  Administered 2016-12-05: 4 mg via INTRAVENOUS

## 2016-12-05 MED ORDER — DEXTROSE 5 % IV SOLN
INTRAVENOUS | Status: DC | PRN
  Administered 2016-12-05: 20 ug/min via INTRAVENOUS

## 2016-12-05 MED ORDER — METOPROLOL SUCCINATE ER 50 MG PO TB24
50.0000 mg | ORAL_TABLET | Freq: Every day | ORAL | Status: DC
  Administered 2016-12-06: 50 mg via ORAL
  Filled 2016-12-05: qty 1

## 2016-12-05 MED ORDER — HEPARIN SODIUM (PORCINE) 1000 UNIT/ML IJ SOLN
INTRAMUSCULAR | Status: DC | PRN
  Administered 2016-12-05: 1000 [IU] via INTRAVENOUS

## 2016-12-05 MED ORDER — ACETAMINOPHEN 325 MG PO TABS
ORAL_TABLET | ORAL | Status: AC
  Filled 2016-12-05: qty 2

## 2016-12-05 MED ORDER — HEPARIN (PORCINE) IN NACL 2-0.9 UNIT/ML-% IJ SOLN
INTRAMUSCULAR | Status: AC | PRN
  Administered 2016-12-05: 500 mL

## 2016-12-05 MED ORDER — PHENYLEPHRINE HCL 10 MG/ML IJ SOLN
INTRAMUSCULAR | Status: DC | PRN
  Administered 2016-12-05 (×2): 40 ug via INTRAVENOUS
  Administered 2016-12-05: 80 ug via INTRAVENOUS
  Administered 2016-12-05 (×2): 120 ug via INTRAVENOUS
  Administered 2016-12-05: 40 ug via INTRAVENOUS
  Administered 2016-12-05: 80 ug via INTRAVENOUS

## 2016-12-05 MED ORDER — HYDROCODONE-ACETAMINOPHEN 5-325 MG PO TABS
1.0000 | ORAL_TABLET | ORAL | Status: DC | PRN
  Administered 2016-12-05: 1 via ORAL

## 2016-12-05 MED ORDER — RIVAROXABAN 20 MG PO TABS
20.0000 mg | ORAL_TABLET | Freq: Every day | ORAL | Status: DC
  Administered 2016-12-05: 20 mg via ORAL
  Filled 2016-12-05: qty 1

## 2016-12-05 MED ORDER — POTASSIUM CHLORIDE CRYS ER 20 MEQ PO TBCR
40.0000 meq | EXTENDED_RELEASE_TABLET | Freq: Every day | ORAL | Status: DC
  Administered 2016-12-05 – 2016-12-06 (×2): 40 meq via ORAL
  Filled 2016-12-05 (×2): qty 2

## 2016-12-05 MED ORDER — ISOPROTERENOL HCL 0.2 MG/ML IJ SOLN
INTRAMUSCULAR | Status: AC
Start: 2016-12-05 — End: ?
  Filled 2016-12-05: qty 5

## 2016-12-05 MED ORDER — SODIUM CHLORIDE 0.9 % IV SOLN: 250.0000 mL | INTRAVENOUS | Status: DC | PRN

## 2016-12-05 MED ORDER — ACETAMINOPHEN 325 MG PO TABS
650.0000 mg | ORAL_TABLET | ORAL | Status: DC | PRN
  Administered 2016-12-05: 650 mg via ORAL
  Filled 2016-12-05 (×2): qty 2

## 2016-12-05 SURGICAL SUPPLY — 20 items
BAG SNAP BAND KOVER 36X36 (MISCELLANEOUS) ×3 IMPLANT
BLANKET WARM UNDERBOD FULL ACC (MISCELLANEOUS) ×3 IMPLANT
CATH MAPPNG PENTARAY F 2-6-2MM (CATHETERS) ×2 IMPLANT
CATH NAVISTAR SMARTTOUCH DF (ABLATOR) ×3 IMPLANT
CATH SOUNDSTAR ECO REPROCESSED (CATHETERS) ×3 IMPLANT
CATH WEBSTER BI DIR CS D-F CRV (CATHETERS) ×3 IMPLANT
COVER SWIFTLINK CONNECTOR (BAG) ×3 IMPLANT
NDL TRANSSPETAL BRK-XS 71CM (NEEDLE) ×1 IMPLANT
NEEDLE TRANSEP BRK 71CM 407200 (NEEDLE) ×3 IMPLANT
NEEDLE TRANSSPETAL BRK-XS 71CM (NEEDLE) ×2
PACK EP LATEX FREE (CUSTOM PROCEDURE TRAY) ×2
PACK EP LF (CUSTOM PROCEDURE TRAY) ×1 IMPLANT
PAD DEFIB LIFELINK (PAD) ×3 IMPLANT
PATCH CARTO3 (PAD) ×3 IMPLANT
PENTARAY F 2-6-2MM (CATHETERS) ×6
SHEATH AVANTI 11F 11CM (SHEATH) ×3 IMPLANT
SHEATH PINNACLE 7F 10CM (SHEATH) ×6 IMPLANT
SHEATH PINNACLE 9F 10CM (SHEATH) ×3 IMPLANT
SHEATH SWARTZ TS SL2 63CM 8.5F (SHEATH) ×3 IMPLANT
TUBING SMART ABLATE COOLFLOW (TUBING) ×3 IMPLANT

## 2016-12-05 NOTE — H&P (Signed)
Connor [name] is a 83 y.o. male who presents today for repeat afib ablation.  Since last being seen in our clinic, the patient reports doing reasonably well.  He has had increased afib episodes.  Connor [name] confirms afib multiple times per week, lasting up to 12 hours.  He reports fatigue and feeling "washed out" with his afib.  Today, he denies symptoms of palpitations, chest pain, shortness of breath,  lower extremity edema, dizziness, presyncope, or syncope.  The patient is otherwise without complaint today.       Past Medical History:  Diagnosis Date  . Abnormal nuclear cardiac imaging test 1/13   Abnormal stress test 05/2011, but cardiac cath 06/2011 showing normal coronary anatomy.  . Anemia   . Atrial flutter [facility])    s/p ablation in March 2012  . BPH (benign prostatic hyperplasia)   . Chronic anticoagulation    on Xarelto for afib  . DVT (deep venous thrombosis) ([place]) 2/12   in setting of R hip surgery, he developed R femoral DVT  . Hyperlipidemia   . Hypertension   . Obstructive sleep apnea    compliant with CPAP.  managed by Dr Elsworth Soho.  . Osteoarthritis   . Paroxysmal atrial fibrillation ([place]) 9/12   a. documented by Dr Barrett Shell on office EKG 9/12. b. maintained on Tikosyn and Xarelto. c. s/p DCCV 10/2012.  Marland Kitchen Peptic ulcer disease   . Prediabetes   . Premature ventricular contraction   . RBBB (right bundle branch block)   . Vitamin D deficiency         Past Surgical History:  Procedure Laterality Date  . ABLATION OF DYSRHYTHMIC FOCUS  08/29/2015  . atrial flutter ablation  5/12   by JA  . CARDIAC CATHETERIZATION    . CARDIOVERSION N/A 10/28/2012   Procedure: CARDIOVERSION;  Surgeon: Burnell Blanks, MD;  Location: [place];  Service: Cardiovascular;  Laterality: N/A;  . ELECTROPHYSIOLOGIC STUDY N/A 08/29/2015   Procedure: Atrial Fibrillation Ablation;  Surgeon: [name], MD;  Location: [place] CV LAB;  Service: Cardiovascular;  Laterality:  N/A;  . LEFT HEART CATHETERIZATION WITH CORONARY ANGIOGRAM N/A 06/07/2011   Procedure: LEFT HEART CATHETERIZATION WITH CORONARY ANGIOGRAM;  Surgeon: Peter M Martinique, MD;  Location[facility] CATH LAB;  Service: Cardiovascular;  Laterality: N/A;  . TOTAL HIP ARTHROPLASTY  06/04/10   RIGHT HIP  . TOTAL KNEE ARTHROPLASTY    . TRANSURETHRAL RESECTION OF PROSTATE      ROS- all systems are reviewed and negatives except as per HPI above        Current Outpatient Prescriptions  Medication Sig Dispense Refill  . acetaminophen (TYLENOL) 500 MG tablet Take 1,000 mg by mouth 2 (two) times daily as needed for moderate pain.    Marland Kitchen atorvastatin (LIPITOR) 10 MG tablet TAKE 1 TABLET BY MOUTH EVERY DAY 90 tablet 1  . Cholecalciferol (VITAMIN D3) 5000 UNITS TABS Take 5,000 Units by mouth See admin instructions. Take 5000 units by mouth every 4 days    . clindamycin (CLEOCIN T) 1 % external solution Apply 1 application topically daily as needed (for bumps in scalp). Apply to scalp daily as needed  2  . dofetilide (TIKOSYN) 500 MCG capsule TAKE 1 CAPSULE BY MOUTH EVERY 12 HOURS 180 capsule 3  . KLOR-CON M20 20 MEQ tablet TAKE 2 TABLETS (40MEQ) BY MOUTH IN THE MORNING AND 2 TABLETS (40MEQ) IN THE EVENING. 360 tablet 0  . metoprolol succinate (TOPROL-XL) 50 MG 24 hr tablet Take 1  tablet (50mg ) in the morning by mouth and 1/2 tablet (25mg )  at bedtime. 45 tablet 6  . metoprolol tartrate (LOPRESSOR) 25 MG tablet TAKE 1 TABLET BY MOUTH EVERY 6 HOURS AS NEEDED FOR FAST HEART RATES 30 tablet 6  . Multiple Vitamin (MULITIVITAMIN) LIQD Take 5 mLs by mouth daily.    . Multiple Vitamins-Minerals (ICAPS PO) Take 1 tablet by mouth every evening.     . Omega-3 Fatty Acids (OMEGA 3 PO) Take 3 capsules by mouth every evening.     Marland Kitchen omeprazole (PRILOSEC) 20 MG capsule Take 20 mg by mouth daily.     Marland Kitchen OVER THE COUNTER MEDICATION Place 3 drops into both eyes daily as needed (dry eyes). "thera tears"    . rivaroxaban  (XARELTO) 20 MG TABS tablet Take 1 tablet (20 mg total) by mouth daily. 90 tablet 1  . tamsulosin (FLOMAX) 0.4 MG CAPS capsule TAKE ONE CAPSULE BY MOUTH EVERY DAY AFTER SAME MEAL 90 capsule 1  . traMADol (ULTRAM) 50 MG tablet Take 1 tablet (50 mg total) by mouth every 6 (six) hours as needed (pain). 60 tablet 5  . vitamin B-12 (CYANOCOBALAMIN) 500 MCG tablet Take 500 mcg by mouth at bedtime.      No current facility-administered medications for this visit.     Physical Exam: Vitals:   12/05/16 0553  BP: 128/78  Pulse: 93  Temp: 97.9 F (36.6 C)    GEN- The patient is well appearing, alert and oriented x 3 today.   Head- normocephalic, atraumatic Eyes-  Sclera clear, conjunctiva pink Ears- hearing intact Oropharynx- clear Lungs- Clear to ausculation bilaterally, normal work of breathing Heart- Regular rate and rhythm, no murmurs, rubs or gallops, PMI not laterally displaced GI- soft, NT, ND, + BS Extremities- no clubbing, cyanosis, or edema   Assessment and Plan:  1. Paroxysmal atrial fibrillation Recurrent symptomatic afib  He is s/p ablation 4/17 He has failed medical therapy with tikosyn Therapeutic strategies for afib including medicine and ablation were discussed in detail with the patient today. Risk, benefits, and alternatives to EP study and radiofrequency ablation for afib were also discussed in detail today. These risks include but are not limited to stroke, bleeding, vascular damage, tamponade, perforation, damage to the esophagus, lungs, and other structures, pulmonary vein stenosis, worsening renal function, and death. The patient understands these risk and wishes to proceed.  We will therefore proceed with catheter ablation today.  He reports compliance with xarelto without interruption.  CT reviewed with him today.  [name] MD, [facility] 12/05/2016 7:31 AM

## 2016-12-05 NOTE — Progress Notes (Signed)
Site area: rt groin fv sheaths x3 Site Prior to Removal:  Level  0 Pressure Applied For: 20 minutes Manual:   yes Patient Status During Pull:  stable Post Pull Site:  Level 0 Post Pull Instructions Given:  yes Post Pull Pulses Present: palpable Dressing Applied:  Gauze and tegaderm Bedrest begins @ [id number] Comments: IV saline locked

## 2016-12-05 NOTE — Discharge Summary (Signed)
ELECTROPHYSIOLOGY PROCEDURE DISCHARGE SUMMARY    Patient ID: Connor [name],  MRN: [id number], DOB/AGE: [birth year] 83 y.o.  Admit date: 12/05/2016 Discharge date: 12/06/2016  Primary Care Physician: Elayne Snare, MD  Electrophysiologist: [name], MD  Primary Discharge Diagnosis:  1. Paroxysmal Afib     CHA2DS2Vasc is at least 4, on xarelto  Secondary Discharge Diagnosis:  1. HTN 2. OSA w/CPAP 3. NICM   Procedures This Admission:  1.  Electrophysiology study and radiofrequency catheter ablation on 12/05/16 by Dr [name].   This study demonstrated CONCLUSIONS: 1. Sinus rhythm upon presentation.   2. Intracardiac echo reveals a moderate sized left atrium with four separate pulmonary veins without evidence of pulmonary vein stenosis. 3. The right superior and inferior pulmonary veins were quiescent from the prior ablation procedure and did not require additional ablation today. 4.  There was return of conduction within the left superior pulmonary vein anteriorly and also along the left inferior pulmonary vein at the carina.   Successful re-isolation and anatomically encircingly of the left pulmonary veins using a WACA technique.  5.  No inducible arrhythmias following ablation both on and off of Isuprel.  Adenosine was also administered to confirm block within the PVs 6. CTI block confirmed from a prior ablation proceduer 7. No early apparent complications.   Brief HPI: Connor [name] is a 83 y.o. male with a history of paroxysmal atrial fibrillation.  They have failed medical therapy with Tikosyn. Risks, benefits, and alternatives to catheter ablation of atrial fibrillation were reviewed with the patient who wished to proceed.  The patient underwent cardiac CT prior to the procedure which demonstrated no LAA thrombus.    Hospital Course:  The patient was admitted and underwent EPS/RFCA of atrial fibrillation with details as outlined above.  They were monitored on  telemetry overnight which demonstrated SR 70's.  Right groin was without complication on the day of discharge.  The patient was examined by Dr. Rayann Heman and considered to be stable for discharge.  Wound care and restrictions were reviewed with the patient.  The patient will be seen back by Roderic Palau, NP in 4 weeks and Dr Rayann Heman in 12 weeks for post ablation follow up.     Physical Exam: Vitals:   12/05/16 2259 12/06/16 0035 12/06/16 0238 12/06/16 0719  BP: 139/76 134/62 122/66 137/82  Pulse: 89 76 74 75  Resp:  17 17 15   Temp:  98.2 F (36.8 C) 97.8 F (36.6 C) (!) 97.5 F (36.4 C)  TempSrc:  Oral Oral Oral  SpO2: 95% 94% 97% 94%  Weight:   234 lb 2.1 oz (106.2 kg)   Height:        GEN- The patient is well appearing, alert and oriented x 3 today.   HEENT: normocephalic, atraumatic; sclera clear, conjunctiva pink; hearing intact; oropharynx clear; neck supple  Lungs- CTA b/l, normal work of breathing.  No wheezes, rales, rhonchi Heart- RRR, no murmurs, rubs or gallops  GI- soft, non-tender Extremities- no clubbing, cyanosis, or edema; DP/PT/radial pulses 2+ bilaterally, Right groin without hematoma/bruit MS- no significant deformity or atrophy Skin- warm and dry, no rash or lesion Psych- euthymic mood, full affect Neuro- strength and sensation are intact   Labs:   Lab Results  Component Value Date   WBC 4.7 11/21/2016   HGB 14.4 11/21/2016   HCT 42.2 11/21/2016   MCV 100 (H) 11/21/2016   PLT 129 (L) 11/21/2016     Recent Labs  Lab 12/06/16 0405  NA 142  K 4.1  CL 110  CO2 26  BUN 12  CREATININE 0.72  CALCIUM 8.4*  GLUCOSE 118*     Discharge Medications:  Allergies as of 12/06/2016      Reactions   Penicillins Itching, Swelling, Other (See Comments)   Has patient had a PCN reaction causing immediate rash, facial/tongue/throat swelling, SOB or lightheadedness with hypotension: Yes Has patient had a PCN reaction causing severe rash involving mucus membranes or  skin necrosis: No Has patient had a PCN reaction that required hospitalization No Has patient had a PCN reaction occurring within the last 10 years: No If all of the above answers are "NO", then may proceed with Cephalosporin use.      Medication List    TAKE these medications   acetaminophen 500 MG tablet Commonly known as:  TYLENOL Take 1,000 mg by mouth 2 (two) times daily as needed for moderate pain.   atorvastatin 10 MG tablet Commonly known as:  LIPITOR TAKE 1 TABLET BY MOUTH EVERY DAY   bacitracin-polymyxin b ointment Commonly known as:  POLYSPORIN Apply 1 application topically daily as needed (wound care).   clindamycin 1 % external solution Commonly known as:  CLEOCIN T Apply 1 application topically daily as needed (for bumps in scalp). Apply to scalp   docusate sodium 100 MG capsule Commonly known as:  COLACE Take 100 mg by mouth every evening.   dofetilide 500 MCG capsule Commonly known as:  TIKOSYN TAKE 1 CAPSULE BY MOUTH EVERY 12 HOURS   MULTIVITAMIN Liqd Take 5 mLs by mouth 2 (two) times a week.   ICAPS PO Take 1 tablet by mouth 2 (two) times daily.   KLOR-CON M20 20 MEQ tablet Generic drug:  potassium chloride SA TAKE 2 TABLETS (40MEQ) BY MOUTH IN THE MORNING AND 2 TABLETS (40MEQ) IN THE EVENING.   metoprolol succinate 50 MG 24 hr tablet Commonly known as:  TOPROL-XL Take 1 tablet (50mg ) in the morning by mouth and 1/2 tablet (25mg )  at bedtime.   metoprolol tartrate 25 MG tablet Commonly known as:  LOPRESSOR TAKE 1 TABLET BY MOUTH EVERY 6 HOURS AS NEEDED FOR FAST HEART RATES   OMEGA 3 PO Take 3 capsules by mouth every evening.   omeprazole 20 MG capsule Commonly known as:  PRILOSEC Take 20 mg by mouth daily.   tamsulosin 0.4 MG Caps capsule Commonly known as:  FLOMAX TAKE ONE CAPSULE BY MOUTH EVERY DAY AFTER SAME MEAL   THERATEARS OP Apply 1 drop to eye daily as needed (dry eyes).   traMADol 50 MG tablet Commonly known as:   ULTRAM Take 1 tablet (50 mg total) by mouth every 6 (six) hours as needed (pain). What changed:  when to take this  reasons to take this   vitamin B-12 500 MCG tablet Commonly known as:  CYANOCOBALAMIN Take 500 mcg by mouth at bedtime.   Vitamin D3 5000 units Tabs Take 5,000 Units by mouth See admin instructions. Take 5000 units by mouth every 4 days   XARELTO 20 MG Tabs tablet Generic drug:  rivaroxaban TAKE 1 TABLET (20 MG TOTAL) BY MOUTH DAILY.       Disposition: Home  Follow-up Information     [facility] Follow up on 01/08/2017.   Specialty:  Cardiology Why:  9:00AM Contact information: [address] [id number] [place] [phone]       [name], MD Follow up on 03/11/2017.  Specialty:  Cardiology Why:  8:00AM Contact information: [place] [address] [phone]           Duration of Discharge Encounter: Greater than 30 minutes including physician time.  Signed, Tommye Standard, PA-C 12/06/2016 7:48 AM   I have seen, examined the patient, and reviewed the above assessment and plan.  On exam RRR.  Changes to above are made where necessary. Routine post afib ablation care.  DC to home.   Co Sign: [name], MD 12/06/2016 8:03 AM

## 2016-12-05 NOTE — Anesthesia Postprocedure Evaluation (Signed)
Anesthesia Post Note  Patient: Connor [name]  Procedure(s) Performed: Procedure(s) (LRB): Atrial Fibrillation Ablation (N/A)     Patient location during evaluation: PACU Anesthesia Type: General Level of consciousness: awake and alert and oriented Pain management: pain level controlled Vital Signs Assessment: post-procedure vital signs reviewed and stable Respiratory status: spontaneous breathing, nonlabored ventilation and respiratory function stable Cardiovascular status: blood pressure returned to baseline and stable Postop Assessment: no signs of nausea or vomiting Anesthetic complications: no    Last Vitals:  Vitals:   12/05/16 1055 12/05/16 1100  BP: 115/76 122/70  Pulse: 86 86  Resp: 15 12  Temp:      Last Pain:  Vitals:   12/05/16 1057  TempSrc:   PainSc: 3                  Serena Petterson A.

## 2016-12-05 NOTE — Transfer of Care (Signed)
Immediate Anesthesia Transfer of Care Note  Patient: Connor [name]  Procedure(s) Performed: Procedure(s): Atrial Fibrillation Ablation (N/A)  Patient Location: Cath Lab  Anesthesia Type:General  Level of Consciousness: awake, alert , oriented and patient cooperative  Airway & Oxygen Therapy: Patient Spontanous Breathing and Patient connected to nasal cannula oxygen  Post-op Assessment: Report given to RN and Post -op Vital signs reviewed and stable  Post vital signs: Reviewed and stable  Last Vitals:  Vitals:   12/05/16 0553 12/05/16 1024  BP: 128/78   Pulse: 93   Temp: 36.6 C 36.5 C    Last Pain:  Vitals:   12/05/16 1024  TempSrc: Temporal         Complications: No apparent anesthesia complications

## 2016-12-06 ENCOUNTER — Other Ambulatory Visit: Payer: Self-pay | Admitting: Cardiovascular Disease

## 2016-12-06 ENCOUNTER — Encounter (HOSPITAL_COMMUNITY): Payer: Self-pay | Admitting: Internal Medicine

## 2016-12-06 DIAGNOSIS — I480 Paroxysmal atrial fibrillation: Secondary | ICD-10-CM | POA: Diagnosis not present

## 2016-12-06 DIAGNOSIS — Z7901 Long term (current) use of anticoagulants: Secondary | ICD-10-CM | POA: Diagnosis not present

## 2016-12-06 DIAGNOSIS — E785 Hyperlipidemia, unspecified: Secondary | ICD-10-CM | POA: Diagnosis not present

## 2016-12-06 DIAGNOSIS — N400 Enlarged prostate without lower urinary tract symptoms: Secondary | ICD-10-CM | POA: Diagnosis not present

## 2016-12-06 LAB — BASIC METABOLIC PANEL
Anion gap: 6 (ref 5–15)
BUN: 12 mg/dL (ref 6–20)
CHLORIDE: 110 mmol/L (ref 101–111)
CO2: 26 mmol/L (ref 22–32)
Calcium: 8.4 mg/dL — ABNORMAL LOW (ref 8.9–10.3)
Creatinine, Ser: 0.72 mg/dL (ref 0.61–1.24)
GFR calc non Af Amer: >60 mL/min (ref >60)
GLUCOSE: 118 mg/dL — AB (ref 65–99)
Potassium: 4.1 mmol/L (ref 3.5–5.1)
Sodium: 142 mmol/L (ref 135–145)

## 2016-12-06 LAB — MAGNESIUM: Magnesium: 2.1 mg/dL (ref 1.7–2.4)

## 2016-12-06 LAB — POCT ACTIVATED CLOTTING TIME
ACTIVATED CLOTTING TIME: 301 s
Activated Clotting Time: 312 seconds

## 2016-12-06 MED ORDER — OFF THE BEAT BOOK
Freq: Once | Status: AC
  Administered 2016-12-06: 09:00:00
  Filled 2016-12-06: qty 1

## 2016-12-06 NOTE — Care Management Note (Signed)
Case Management Note  Patient Details  Name: Connor [name] MRN: [id number] Date of Birth: 1933-08-11  Subjective/Objective:   From home with wife, pta indep, s/p afib ablation for dc today, he has already been on xarelto and tikosyn, no needs.               Action/Plan: NCM will follow for dc needs.  Expected Discharge Date:  12/06/16               Expected Discharge Plan:  Home/Self Care  In-House Referral:     Discharge planning Services  CM Consult  Post Acute Care Choice:    Choice offered to:     DME Arranged:    DME Agency:     HH Arranged:    HH Agency:     Status of Service:  Completed, signed off  If discussed at [employer] of Stay Meetings, dates discussed:    Additional Comments:  Zenon Mayo, RN 12/06/2016, 9:18 AM

## 2016-12-06 NOTE — Discharge Instructions (Signed)
Procedure site care instructions No driving for 4 days. No lifting over 5 lbs for 1 week. No vigorous or sexual activity for 1 week. You may return to work on 12/12/16. Keep procedure site clean & dry. If you notice increased pain, swelling, bleeding or pus, call/return!  You may shower, but no soaking baths/hot tubs/pools for 1 week.     You have an appointment set up with the [facility].  Multiple studies have shown that being followed by a dedicated atrial fibrillation clinic in addition to the standard care you receive from your other physicians improves health. We believe that enrollment in the atrial fibrillation clinic will allow [place] to better care for you.   The phone number to the [facility] is [phone]. The clinic is staffed Monday through Friday from 8:30am to 5pm.  Parking Directions: The clinic is located in the Heart and Vascular Building connected to [facility]. 1)From [address] turn on to [address] and go to the 3rd entrance  (Heart and Vascular entrance) on the right. 2)Look to the right for Heart &Vascular Parking Garage. 3)A code for the entrance is required please call the clinic to receive this.   4)Take the elevators to the 1st floor. Registration is in the room with the glass walls at the end of the hallway.  If you have any trouble parking or locating the clinic, please dont hesitate to call [phone].

## 2016-12-28 ENCOUNTER — Other Ambulatory Visit (HOSPITAL_COMMUNITY): Payer: Self-pay | Admitting: Nurse Practitioner

## 2016-12-28 ENCOUNTER — Other Ambulatory Visit: Payer: Self-pay | Admitting: Internal Medicine

## 2017-01-08 ENCOUNTER — Ambulatory Visit (HOSPITAL_COMMUNITY)
Admission: RE | Admit: 2017-01-08 | Discharge: 2017-01-08 | Disposition: A | Discharge status: Home / Self Care | Payer: Medicare Other | Source: Ambulatory Visit | Attending: Nurse Practitioner | Admitting: Nurse Practitioner

## 2017-01-08 ENCOUNTER — Encounter (HOSPITAL_COMMUNITY): Payer: Self-pay | Admitting: Nurse Practitioner

## 2017-01-08 VITALS — BP 118/62 | HR 74 | Ht 72.0 in | Wt 225.6 lb

## 2017-01-08 DIAGNOSIS — N400 Enlarged prostate without lower urinary tract symptoms: Secondary | ICD-10-CM | POA: Diagnosis not present

## 2017-01-08 DIAGNOSIS — G4733 Obstructive sleep apnea (adult) (pediatric): Secondary | ICD-10-CM | POA: Diagnosis not present

## 2017-01-08 DIAGNOSIS — M1990 Unspecified osteoarthritis, unspecified site: Secondary | ICD-10-CM | POA: Diagnosis not present

## 2017-01-08 DIAGNOSIS — R7303 Prediabetes: Secondary | ICD-10-CM | POA: Diagnosis not present

## 2017-01-08 DIAGNOSIS — I4892 Unspecified atrial flutter: Secondary | ICD-10-CM | POA: Insufficient documentation

## 2017-01-08 DIAGNOSIS — Z96641 Presence of right artificial hip joint: Secondary | ICD-10-CM | POA: Diagnosis not present

## 2017-01-08 DIAGNOSIS — I480 Paroxysmal atrial fibrillation: Secondary | ICD-10-CM | POA: Insufficient documentation

## 2017-01-08 DIAGNOSIS — I4891 Unspecified atrial fibrillation: Secondary | ICD-10-CM | POA: Diagnosis present

## 2017-01-08 DIAGNOSIS — E559 Vitamin D deficiency, unspecified: Secondary | ICD-10-CM | POA: Diagnosis not present

## 2017-01-08 DIAGNOSIS — Z7901 Long term (current) use of anticoagulants: Secondary | ICD-10-CM | POA: Diagnosis not present

## 2017-01-08 DIAGNOSIS — Z79899 Other long term (current) drug therapy: Secondary | ICD-10-CM | POA: Diagnosis not present

## 2017-01-08 DIAGNOSIS — I4510 Unspecified right bundle-branch block: Secondary | ICD-10-CM | POA: Insufficient documentation

## 2017-01-08 DIAGNOSIS — Z9889 Other specified postprocedural states: Secondary | ICD-10-CM | POA: Insufficient documentation

## 2017-01-08 DIAGNOSIS — Z880 Allergy status to penicillin: Secondary | ICD-10-CM | POA: Insufficient documentation

## 2017-01-08 DIAGNOSIS — I10 Essential (primary) hypertension: Secondary | ICD-10-CM | POA: Insufficient documentation

## 2017-01-08 DIAGNOSIS — E785 Hyperlipidemia, unspecified: Secondary | ICD-10-CM | POA: Insufficient documentation

## 2017-01-08 DIAGNOSIS — Z96659 Presence of unspecified artificial knee joint: Secondary | ICD-10-CM | POA: Insufficient documentation

## 2017-01-08 DIAGNOSIS — Z87891 Personal history of nicotine dependence: Secondary | ICD-10-CM | POA: Insufficient documentation

## 2017-01-08 MED ORDER — POTASSIUM CHLORIDE CRYS ER 20 MEQ PO TBCR: EXTENDED_RELEASE_TABLET | ORAL | 0 refills | Status: DC

## 2017-01-08 NOTE — Progress Notes (Signed)
Patient ID: Connor [name], male   DOB: 05-25-1933, 84 y.o.   MRN: [id number]     Primary Care Physician: Elayne Snare, MD Referring Physician: Dr. [name] [name] is a 83 y.o. male with a h/o PAF that recently underwent repeat afib ablation 08/29/15 for f/u in the afib clinic. He reports just one epiosode of afib a few days after ablation. Otherwise staying in SR, very little afib. Continues on tikosyn/xarelto without issues.He did have a torn hamstring with bleeding into left leg which caused hypotension and near syncope and was admitted to [facility]. Xarelto was held x 4 days and has resolved.  He asked to be seen in  the afib clinic, 3/12, for wakening with afib this am. He took his am meds and now is in SR. He said this has happened 3-4 early mornings over the last month.He is using his cpap and wore the entire night last night and still woke up with afib. He is taking his tikosyn/xarelto as prescribed. Small dose of BB at bedtime was added.  Pt asked to be in afib clinic, 6/14, for increased afib burden over several weeks, occurs several times a week . He has a Investment banker, operational and brings in afib strips for for review. Will last on a average of 2 hours. He continues on max dose of tikosyn and is limited on BB dose due to hypotension.  F/u afib clinic, 9/5. He underwent repeat ablation 8/2, and has done well. Has a Kardia device and it has shown a few episodes of nonsustained afib but pt unaware. He is in SR today and feels his afib burden is low.No swallowing or groin issues.  Today, he denies symptoms of palpitations, chest pain, shortness of breath, orthopnea, PND, lower extremity edema, dizziness, presyncope, syncope, or neurologic sequela. The patient is tolerating medications without difficulties and is otherwise without complaint today.   Past Medical History:  Diagnosis Date  . Abnormal nuclear cardiac imaging test 1/13   Abnormal stress test 05/2011, but cardiac cath 06/2011 showing  normal coronary anatomy.  . Anemia   . Atrial flutter [facility])    s/p ablation in March 2012  . BPH (benign prostatic hyperplasia)   . Chronic anticoagulation    on Xarelto for afib  . DVT (deep venous thrombosis) ([place]) 2/12   in setting of R hip surgery, he developed R femoral DVT  . Hyperlipidemia   . Hypertension   . Obstructive sleep apnea    compliant with CPAP.  managed by Dr Elsworth Soho.  . Osteoarthritis   . Paroxysmal atrial fibrillation ([place]) 9/12   a. documented by Dr Barrett Shell on office EKG 9/12. b. maintained on Tikosyn and Xarelto. c. s/p DCCV 10/2012.  Marland Kitchen Peptic ulcer disease   . Prediabetes   . Premature ventricular contraction   . RBBB (right bundle branch block)   . Vitamin D deficiency    Past Surgical History:  Procedure Laterality Date  . ABLATION OF DYSRHYTHMIC FOCUS  08/29/2015  . ATRIAL FIBRILLATION ABLATION  12/05/2016  . ATRIAL FIBRILLATION ABLATION N/A 12/05/2016   Procedure: Atrial Fibrillation Ablation;  Surgeon: Connor Grayer, MD;  Location: [facility];  Service: Cardiovascular;  Laterality: N/A;  . ATRIAL FLUTTER ABLATION  09/2010   by Greggory Brandy  . CARDIAC CATHETERIZATION    . CARDIOVERSION N/A 10/28/2012   Procedure: CARDIOVERSION;  Surgeon: Burnell Blanks, MD;  Location: [facility];  Service: Cardiovascular;  Laterality: N/A;  . ELECTROPHYSIOLOGIC STUDY N/A  08/29/2015   Procedure: Atrial Fibrillation Ablation;  Surgeon: Connor Grayer, MD;  Location: [facility];  Service: Cardiovascular;  Laterality: N/A;  . JOINT REPLACEMENT    . LEFT HEART CATHETERIZATION WITH CORONARY ANGIOGRAM N/A 06/07/2011   Procedure: LEFT HEART CATHETERIZATION WITH CORONARY ANGIOGRAM;  Surgeon: Peter M Martinique, MD;  Location: [facility] CATH LAB;  Service: Cardiovascular;  Laterality: N/A;  . TOTAL HIP ARTHROPLASTY  06/04/10   RIGHT HIP  . TOTAL KNEE ARTHROPLASTY    . TRANSURETHRAL RESECTION OF PROSTATE      Current Outpatient Prescriptions  Medication Sig Dispense Refill  .  acetaminophen (TYLENOL) 500 MG tablet Take 1,000 mg by mouth 2 (two) times daily as needed for moderate pain.    Marland Kitchen atorvastatin (LIPITOR) 10 MG tablet TAKE 1 TABLET BY MOUTH EVERY DAY 90 tablet 1  . Carboxymethylcellulose Sodium (THERATEARS OP) Apply 1 drop to eye daily as needed (dry eyes).    . Cholecalciferol (VITAMIN D3) 5000 UNITS TABS Take 5,000 Units by mouth See admin instructions. Take 5000 units by mouth every 4 days    . clindamycin (CLEOCIN T) 1 % external solution Apply 1 application topically daily as needed (for bumps in scalp). Apply to scalp  2  . docusate sodium (COLACE) 100 MG capsule Take 100 mg by mouth every evening.    . dofetilide (TIKOSYN) 500 MCG capsule TAKE 1 CAPSULE BY MOUTH EVERY 12 HOURS 180 capsule 3  . metoprolol succinate (TOPROL-XL) 50 MG 24 hr tablet TAKE 1 TABLET BY MOUTH IN THE MORNING AND 1/2 TABLET AT BEDTIME 45 tablet 6  . Multiple Vitamins-Minerals (ICAPS PO) Take 1 tablet by mouth 2 (two) times daily.     . Multiple Vitamins-Minerals (MULTIVITAMIN) LIQD Take 5 mLs by mouth 2 (two) times a week.    . Omega-3 Fatty Acids (OMEGA 3 PO) Take 3 capsules by mouth every evening.     Marland Kitchen omeprazole (PRILOSEC) 20 MG capsule Take 20 mg by mouth daily.     . potassium chloride SA (KLOR-CON M20) 20 MEQ tablet TAKE 2 TABLETS (40MEQ) BY MOUTH IN THE MORNING AND 2 TABLETS (40MEQ) IN THE EVENING. 360 tablet 0  . tamsulosin (FLOMAX) 0.4 MG CAPS capsule TAKE ONE CAPSULE BY MOUTH EVERY DAY AFTER SAME MEAL 90 capsule 1  . vitamin B-12 (CYANOCOBALAMIN) 500 MCG tablet Take 500 mcg by mouth at bedtime.     Alveda Reasons 20 MG TABS tablet TAKE 1 TABLET (20 MG TOTAL) BY MOUTH DAILY. 90 tablet 3  . bacitracin-polymyxin b (POLYSPORIN) ointment Apply 1 application topically daily as needed (wound care).    . metoprolol tartrate (LOPRESSOR) 25 MG tablet TAKE 1 TABLET BY MOUTH EVERY 6 HOURS AS NEEDED FOR FAST HEART RATES (Patient not taking: Reported on 01/08/2017) 30 tablet 6  . traMADol  (ULTRAM) 50 MG tablet Take 1 tablet (50 mg total) by mouth every 6 (six) hours as needed (pain). (Patient not taking: Reported on 01/08/2017) 60 tablet 5   No current facility-administered medications for this encounter.     Allergies  Allergen Reactions  . Penicillins Itching, Swelling and Other (See Comments)    Has patient had a PCN reaction causing immediate rash, facial/tongue/throat swelling, SOB or lightheadedness with hypotension: Yes Has patient had a PCN reaction causing severe rash involving mucus membranes or skin necrosis: No Has patient had a PCN reaction that required hospitalization No Has patient had a PCN reaction occurring within the last 10 years: No If all of the  above answers are "NO", then may proceed with Cephalosporin use.     Social History   Social History  . Marital status: Married    Spouse name: N/A  . Number of children: N/A  . Years of education: N/A   Occupational History  . Not on file.   Social History Main Topics  . Smoking status: Former Smoker    Packs/day: 1.00    Years: 30.00    Types: Cigarettes    Quit date: 05/06/1978  . Smokeless tobacco: Never Used  . Alcohol use 3.0 oz/week    5 Cans of beer per week     Comment: he has just about quit  . Drug use: No  . Sexual activity: Not on file   Other Topics Concern  . Not on file   Social History Narrative   he patient lives in [place] with his spouse.  Retired.    Family History  Problem Relation Age of Onset  . Heart attack Father   . Lung disease Mother   . Arrhythmia Sister   . Atrial fibrillation Brother   . Diabetes Neg Hx     ROS- All systems are reviewed and negative except as per the HPI above  Physical Exam: Vitals:   01/08/17 0859  BP: 118/62  Pulse: 74  Weight: 225 lb 9.6 oz (102.3 kg)  Height: 6' (1.829 m)    GEN- The patient is well appearing, alert and oriented x 3 today.   Head- normocephalic, atraumatic Eyes-  Sclera clear, conjunctiva pink Ears-  hearing intact Oropharynx- clear Neck- supple, no JVP Lymph- no cervical lymphadenopathy Lungs- Clear to ausculation bilaterally, normal work of breathing Heart- Regular rate and rhythm, no murmurs, rubs or gallops, PMI not laterally displaced GI- soft, NT, ND, + BS Extremities- no clubbing, cyanosis, or edema MS- no significant deformity or atrophy Skin- no rash or lesion Psych- euthymic mood, full affect Neuro- strength and sensation are intact  EKG- NSR with frequent PVC's, pr int 162 ms, qrs int 142 ms, qtc 530 ms (stable for pt on tikosyn with RBBB) Epic records reviewed  Assessment and Plan: 1. Paroxysmal atrial fibrillation  Maintaining  SR Increased afib burden prompted redo atrial ablation 8/2 S/p flutter ablation in 2012 and afib ablation 4/17 Continue tikosyn,xarelto for chadsvasc score of at least 3 Contiue metoprolol SR 50 am and  25 mg in pm  Bmet/mag done one month ago and stable with K+ at 4.1, mag at 2.1 Continue cpap  F/u with Dr. Rayann Heman 11/5, afib clinic as needed  Butch Penny C. Giorgi Debruin, [facility] [address] [place], [place] [address] [phone]

## 2017-02-04 ENCOUNTER — Other Ambulatory Visit: Payer: Medicare Other

## 2017-02-07 ENCOUNTER — Ambulatory Visit: Payer: Medicare Other | Admitting: Endocrinology

## 2017-02-11 ENCOUNTER — Telehealth: Payer: Self-pay | Admitting: Internal Medicine

## 2017-02-11 NOTE — Telephone Encounter (Signed)
Cipro and Bactrim are both contraindicated with Tikosyn - pt will need to contact his prescribing MD for alternative antibiotics.

## 2017-02-11 NOTE — Telephone Encounter (Signed)
Pt c/o medication issue:  1. Name of Medication: Cipro and Bactrim interactions the tikosyn   2. How are you currently taking this medication (dosage and times per day)? tikosyn 500mg   3. Are you having a reaction (difficulty breathing--STAT)? no  4. What is your medication issue? Drug interaction, [facility] [place]

## 2017-02-11 NOTE — Telephone Encounter (Signed)
Megan-Can you help with this?

## 2017-02-11 NOTE — Telephone Encounter (Signed)
Heather at [facility] is aware Cipro and Bactrim are both contraindicated with Tikosyn - pt will need to contact his prescribing MD for alternative antibiotics.

## 2017-02-24 ENCOUNTER — Other Ambulatory Visit (INDEPENDENT_AMBULATORY_CARE_PROVIDER_SITE_OTHER): Payer: Medicare Other

## 2017-02-24 DIAGNOSIS — R7301 Impaired fasting glucose: Secondary | ICD-10-CM

## 2017-02-24 LAB — BASIC METABOLIC PANEL
BUN: 15 mg/dL (ref 6–23)
CO2: 28 mEq/L (ref 19–32)
Calcium: 8.9 mg/dL (ref 8.4–10.5)
Chloride: 108 mEq/L (ref 96–112)
Creatinine, Ser: 0.72 mg/dL (ref 0.40–1.50)
GFR: 111.36 mL/min (ref >60.00)
Glucose, Bld: 111 mg/dL — ABNORMAL HIGH (ref 70–99)
Potassium: 4.3 mEq/L (ref 3.5–5.1)
Sodium: 141 mEq/L (ref 135–145)

## 2017-02-24 LAB — HEMOGLOBIN A1C: HEMOGLOBIN A1C: 5.9 % (ref 4.6–6.5)

## 2017-02-26 ENCOUNTER — Ambulatory Visit (INDEPENDENT_AMBULATORY_CARE_PROVIDER_SITE_OTHER): Payer: Medicare Other | Admitting: Endocrinology

## 2017-02-26 ENCOUNTER — Encounter: Payer: Self-pay | Admitting: Endocrinology

## 2017-02-26 VITALS — BP 126/80 | HR 76 | Ht 72.0 in | Wt 226.8 lb

## 2017-02-26 DIAGNOSIS — E559 Vitamin D deficiency, unspecified: Secondary | ICD-10-CM | POA: Diagnosis not present

## 2017-02-26 DIAGNOSIS — I10 Essential (primary) hypertension: Secondary | ICD-10-CM

## 2017-02-26 DIAGNOSIS — R7301 Impaired fasting glucose: Secondary | ICD-10-CM | POA: Diagnosis not present

## 2017-02-26 DIAGNOSIS — D7589 Other specified diseases of blood and blood-forming organs: Secondary | ICD-10-CM

## 2017-02-26 DIAGNOSIS — E7800 Pure hypercholesterolemia, unspecified: Secondary | ICD-10-CM | POA: Diagnosis not present

## 2017-02-26 DIAGNOSIS — E049 Nontoxic goiter, unspecified: Secondary | ICD-10-CM

## 2017-02-26 NOTE — Progress Notes (Signed)
Patient ID: Connor [name], male   DOB: Jun 01, 1933, 83 y.o.   MRN: [id number]           Chief complaint: Follow-up of blood sugars  History of Present Illness:  He has had long-standing impaired fasting glucose with usually borderline A1c for prediabetes On his last visit fasting glucose was 118 and he was told to start working on his diet and exercising Although he has not lost any weight since then he has been trying to exercise when he can Not following any particular diet but generally watching portions  Fasting glucose is relatively better now but A1c is still in early prediabetes stage   Wt Readings from Last 3 Encounters:  02/26/17 226 lb 12.8 oz (102.9 kg)  01/08/17 225 lb 9.6 oz (102.3 kg)  12/06/16 234 lb 2.1 oz (106.2 kg)    Lab Results  Component Value Date   HGBA1C 5.9 02/24/2017   HGBA1C 5.7 11/04/2016   HGBA1C 5.8 09/20/2014   Lab Results  Component Value Date   LDLCALC 77 11/04/2016   CREATININE 0.72 02/24/2017      Allergies as of 02/26/2017      Reactions   Penicillins Itching, Swelling, Other (See Comments)   Has patient had a PCN reaction causing immediate rash, facial/tongue/throat swelling, SOB or lightheadedness with hypotension: Yes Has patient had a PCN reaction causing severe rash involving mucus membranes or skin necrosis: No Has patient had a PCN reaction that required hospitalization No Has patient had a PCN reaction occurring within the last 10 years: No If all of the above answers are "NO", then may proceed with Cephalosporin use.      Medication List       Accurate as of 02/26/17 10:16 AM. Always use your most recent med list.          acetaminophen 500 MG tablet Commonly known as:  TYLENOL Take 1,000 mg by mouth 2 (two) times daily as needed for moderate pain.   atorvastatin 10 MG tablet Commonly known as:  LIPITOR TAKE 1 TABLET BY MOUTH EVERY DAY   bacitracin-polymyxin b ointment Commonly known as:  POLYSPORIN Apply  1 application topically daily as needed (wound care).   clindamycin 1 % external solution Commonly known as:  CLEOCIN T Apply 1 application topically daily as needed (for bumps in scalp). Apply to scalp   docusate sodium 100 MG capsule Commonly known as:  COLACE Take 100 mg by mouth every evening.   dofetilide 500 MCG capsule Commonly known as:  TIKOSYN TAKE 1 CAPSULE BY MOUTH EVERY 12 HOURS   MULTIVITAMIN Liqd Take 5 mLs by mouth 2 (two) times a week.   ICAPS PO Take 1 tablet by mouth 2 (two) times daily.   metoprolol succinate 50 MG 24 hr tablet Commonly known as:  TOPROL-XL TAKE 1 TABLET BY MOUTH IN THE MORNING AND 1/2 TABLET AT BEDTIME   metoprolol tartrate 25 MG tablet Commonly known as:  LOPRESSOR TAKE 1 TABLET BY MOUTH EVERY 6 HOURS AS NEEDED FOR FAST HEART RATES   OMEGA 3 PO Take 3 capsules by mouth every evening.   omeprazole 20 MG capsule Commonly known as:  PRILOSEC Take 20 mg by mouth daily.   potassium chloride SA 20 MEQ tablet Commonly known as:  KLOR-CON M20 TAKE 2 TABLETS (40MEQ) BY MOUTH IN THE MORNING AND 2 TABLETS (40MEQ) IN THE EVENING.   tamsulosin 0.4 MG Caps capsule Commonly known as:  FLOMAX TAKE ONE CAPSULE BY MOUTH  EVERY DAY AFTER SAME MEAL   THERATEARS OP Apply 1 drop to eye daily as needed (dry eyes).   traMADol 50 MG tablet Commonly known as:  ULTRAM Take 1 tablet (50 mg total) by mouth every 6 (six) hours as needed (pain).   vitamin B-12 500 MCG tablet Commonly known as:  CYANOCOBALAMIN Take 500 mcg by mouth at bedtime.   Vitamin D3 5000 units Tabs Take 5,000 Units by mouth See admin instructions. Take 5000 units by mouth every 4 days   XARELTO 20 MG Tabs tablet Generic drug:  rivaroxaban TAKE 1 TABLET (20 MG TOTAL) BY MOUTH DAILY.       Allergies:  Allergies  Allergen Reactions  . Penicillins Itching, Swelling and Other (See Comments)    Has patient had a PCN reaction causing immediate rash, facial/tongue/throat  swelling, SOB or lightheadedness with hypotension: Yes Has patient had a PCN reaction causing severe rash involving mucus membranes or skin necrosis: No Has patient had a PCN reaction that required hospitalization No Has patient had a PCN reaction occurring within the last 10 years: No If all of the above answers are "NO", then may proceed with Cephalosporin use.     Past Medical History:  Diagnosis Date  . Abnormal nuclear cardiac imaging test 1/13   Abnormal stress test 05/2011, but cardiac cath 06/2011 showing normal coronary anatomy.  . Anemia   . Atrial flutter [facility])    s/p ablation in March 2012  . BPH (benign prostatic hyperplasia)   . Chronic anticoagulation    on Xarelto for afib  . DVT (deep venous thrombosis) ([place]) 2/12   in setting of R hip surgery, he developed R femoral DVT  . Hyperlipidemia   . Hypertension   . Obstructive sleep apnea    compliant with CPAP.  managed by Dr Elsworth Soho.  . Osteoarthritis   . Paroxysmal atrial fibrillation ([place]) 9/12   a. documented by Dr Barrett Shell on office EKG 9/12. b. maintained on Tikosyn and Xarelto. c. s/p DCCV 10/2012.  Marland Kitchen Peptic ulcer disease   . Prediabetes   . Premature ventricular contraction   . RBBB (right bundle branch block)   . Vitamin D deficiency     Past Surgical History:  Procedure Laterality Date  . ABLATION OF DYSRHYTHMIC FOCUS  08/29/2015  . ATRIAL FIBRILLATION ABLATION  12/05/2016  . ATRIAL FIBRILLATION ABLATION N/A 12/05/2016   Procedure: Atrial Fibrillation Ablation;  Surgeon: [name], MD;  Location: [place] CV LAB;  Service: Cardiovascular;  Laterality: N/A;  . ATRIAL FLUTTER ABLATION  09/2010   by Greggory Brandy  . CARDIAC CATHETERIZATION    . CARDIOVERSION N/A 10/28/2012   Procedure: CARDIOVERSION;  Surgeon: Burnell Blanks, MD;  Location: [place];  Service: Cardiovascular;  Laterality: N/A;  . ELECTROPHYSIOLOGIC STUDY N/A 08/29/2015   Procedure: Atrial Fibrillation Ablation;  Surgeon: [name], MD;   Location: [place] CV LAB;  Service: Cardiovascular;  Laterality: N/A;  . JOINT REPLACEMENT    . LEFT HEART CATHETERIZATION WITH CORONARY ANGIOGRAM N/A 06/07/2011   Procedure: LEFT HEART CATHETERIZATION WITH CORONARY ANGIOGRAM;  Surgeon: Peter M Martinique, MD;  Location: [facility] CATH LAB;  Service: Cardiovascular;  Laterality: N/A;  . TOTAL HIP ARTHROPLASTY  06/04/10   RIGHT HIP  . TOTAL KNEE ARTHROPLASTY    . TRANSURETHRAL RESECTION OF PROSTATE      Family History  Problem Relation Age of Onset  . Heart attack Father   . Lung disease Mother   . Arrhythmia Sister   .  Atrial fibrillation Brother   . Diabetes Neg Hx     Social History:  reports that he quit smoking about 38 years ago. His smoking use included Cigarettes. He has a 30.00 pack-year smoking history. He has never used smokeless tobacco. He reports that he drinks about 3.0 oz of alcohol per week . He reports that he does not use drugs.   REVIEW of systems  Over 2 weeks ago he had marked hematuria, no etiology found and he is due to go for a cystoscopy with his urologist Recently urine clear  BLOOD pressure at home usually fairly good with no recent tachycardia     Lab on 02/24/2017  Component Date Value Ref Range Status  . Hgb A1c MFr Bld 02/24/2017 5.9  4.6 - 6.5 % Final   Glycemic Control Guidelines for People with Diabetes:Non Diabetic:  <6%Goal of Therapy: <7%Additional Action Suggested:  >8%   . Sodium 02/24/2017 141  135 - 145 mEq/L Final  . Potassium 02/24/2017 4.3  3.5 - 5.1 mEq/L Final  . Chloride 02/24/2017 108  96 - 112 mEq/L Final  . CO2 02/24/2017 28  19 - 32 mEq/L Final  . Glucose, Bld 02/24/2017 111* 70 - 99 mg/dL Final  . BUN 02/24/2017 15  6 - 23 mg/dL Final  . Creatinine, Ser 02/24/2017 0.72  0.40 - 1.50 mg/dL Final  . Calcium 02/24/2017 8.9  8.4 - 10.5 mg/dL Final  . GFR 02/24/2017 111.36  >60.00 mL/min Final    EXAM:  BP 126/80   Pulse 76   Ht 6' (1.829 m)   Wt 226 lb 12.8 oz (102.9 kg)   SpO2  95%   BMI 30.76 kg/m   Physical Exam  Exam not indicated  Assessment/Plan:   PREDIABETES with impaired fasting glucose   Stressed importance of weight loss, regular exercise and cutting back on carbohydrates and I faxed  Will reassess at his next visit for his physical:  Hemoccult cards given, he is due for his annual Hemoccults   Areil Ottey 02/26/2017, 10:16 AM

## 2017-02-26 NOTE — Patient Instructions (Signed)
Low fat lo carb diet

## 2017-03-10 ENCOUNTER — Ambulatory Visit (INDEPENDENT_AMBULATORY_CARE_PROVIDER_SITE_OTHER): Payer: Medicare Other | Admitting: Internal Medicine

## 2017-03-10 ENCOUNTER — Encounter: Payer: Self-pay | Admitting: Internal Medicine

## 2017-03-10 VITALS — BP 127/78 | HR 74 | Ht 72.0 in | Wt 227.2 lb

## 2017-03-10 DIAGNOSIS — I428 Other cardiomyopathies: Secondary | ICD-10-CM | POA: Diagnosis not present

## 2017-03-10 DIAGNOSIS — I480 Paroxysmal atrial fibrillation: Secondary | ICD-10-CM

## 2017-03-10 DIAGNOSIS — G4733 Obstructive sleep apnea (adult) (pediatric): Secondary | ICD-10-CM

## 2017-03-10 DIAGNOSIS — I10 Essential (primary) hypertension: Secondary | ICD-10-CM | POA: Diagnosis not present

## 2017-03-10 NOTE — Patient Instructions (Signed)
Medication Instructions:  Your physician recommends that you continue on your current medications as directed. Please refer to the Current Medication list given to you today.  -- If you need a refill on your cardiac medications before your next appointment, please call your pharmacy. --  Labwork: None ordered  Testing/Procedures: Your physician has requested that you have an echocardiogram. Echocardiography is a painless test that uses sound waves to create images of your heart. It provides your doctor with information about the size and shape of your heart and how well your heart's chambers and valves are working. This procedure takes approximately one hour. There are no restrictions for this procedure.    Follow-Up: Your physician wants you to follow-up in: 3 months with Dr. Rayann Heman.  You will receive a reminder letter in the mail two months in advance. If you don't receive a letter, please call our office to schedule the follow-up appointment.  Thank you for choosing [facility]!!   Frederik Schmidt, RN [phone]  Any Other Special Instructions Will Be Listed Below (If Applicable).

## 2017-03-10 NOTE — Progress Notes (Signed)
PCP: Elayne Snare, MD    Connor [name] is a 83 y.o. male who presents today for routine electrophysiology followup.  Since his recent afib ablation, the patient reports doing very well.  he denies procedure related complications and is pleased with the results of the procedure.  Today, he denies symptoms of palpitations, chest pain, shortness of breath,  lower extremity edema, dizziness, presyncope, or syncope.  The patient is otherwise without complaint today.   Past Medical History:  Diagnosis Date  . Abnormal nuclear cardiac imaging test 1/13   Abnormal stress test 05/2011, but cardiac cath 06/2011 showing normal coronary anatomy.  . Anemia   . Atrial flutter [facility])    s/p ablation in March 2012  . BPH (benign prostatic hyperplasia)   . Chronic anticoagulation    on Xarelto for afib  . DVT (deep venous thrombosis) ([place]) 2/12   in setting of R hip surgery, he developed R femoral DVT  . Hyperlipidemia   . Hypertension   . Obstructive sleep apnea    compliant with CPAP.  managed by Dr Elsworth Soho.  . Osteoarthritis   . Paroxysmal atrial fibrillation ([place]) 9/12   a. documented by Dr Barrett Shell on office EKG 9/12. b. maintained on Tikosyn and Xarelto. c. s/p DCCV 10/2012.  Marland Kitchen Peptic ulcer disease   . Prediabetes   . Premature ventricular contraction   . RBBB (right bundle branch block)   . Vitamin D deficiency    Past Surgical History:  Procedure Laterality Date  . ABLATION OF DYSRHYTHMIC FOCUS  08/29/2015  . ATRIAL FIBRILLATION ABLATION  12/05/2016  . ATRIAL FLUTTER ABLATION  09/2010   by Greggory Brandy  . CARDIAC CATHETERIZATION    . JOINT REPLACEMENT    . TOTAL HIP ARTHROPLASTY  06/04/10   RIGHT HIP  . TOTAL KNEE ARTHROPLASTY    . TRANSURETHRAL RESECTION OF PROSTATE      ROS- all systems are personally reviewed and negatives except as per HPI above  Current Outpatient Medications  Medication Sig Dispense Refill  . acetaminophen (TYLENOL) 500 MG tablet Take 1,000 mg by mouth 2 (two) times  daily as needed for moderate pain.    Marland Kitchen atorvastatin (LIPITOR) 10 MG tablet TAKE 1 TABLET BY MOUTH EVERY DAY 90 tablet 1  . bacitracin-polymyxin b (POLYSPORIN) ointment Apply 1 application topically daily as needed (wound care).    . Carboxymethylcellulose Sodium (THERATEARS OP) Apply 1 drop to eye daily as needed (dry eyes).    . Cholecalciferol (VITAMIN D3) 5000 UNITS TABS Take 5,000 Units by mouth See admin instructions. Take 5000 units by mouth every 4 days    . clindamycin (CLEOCIN T) 1 % external solution Apply 1 application topically daily as needed (for bumps in scalp). Apply to scalp  2  . docusate sodium (COLACE) 100 MG capsule Take 100 mg daily as needed by mouth (constipation).     . dofetilide (TIKOSYN) 500 MCG capsule TAKE 1 CAPSULE BY MOUTH EVERY 12 HOURS 180 capsule 3  . metoprolol succinate (TOPROL-XL) 50 MG 24 hr tablet TAKE 1 TABLET BY MOUTH IN THE MORNING AND 1/2 TABLET AT BEDTIME 45 tablet 6  . metoprolol tartrate (LOPRESSOR) 25 MG tablet TAKE 1 TABLET BY MOUTH EVERY 6 HOURS AS NEEDED FOR FAST HEART RATES 30 tablet 6  . Multiple Vitamins-Minerals (ICAPS PO) Take 1 tablet by mouth 2 (two) times daily.     . Multiple Vitamins-Minerals (MULTIVITAMIN) LIQD Take 5 mLs by mouth 2 (two) times a week.    Marland Kitchen  Omega-3 Fatty Acids (OMEGA 3 PO) Take 3 capsules by mouth every evening.     Marland Kitchen omeprazole (PRILOSEC) 20 MG capsule Take 20 mg by mouth daily.     . potassium chloride SA (KLOR-CON M20) 20 MEQ tablet TAKE 2 TABLETS (40MEQ) BY MOUTH IN THE MORNING AND 2 TABLETS (40MEQ) IN THE EVENING. 360 tablet 0  . tamsulosin (FLOMAX) 0.4 MG CAPS capsule TAKE ONE CAPSULE BY MOUTH EVERY DAY AFTER SAME MEAL 90 capsule 1  . vitamin B-12 (CYANOCOBALAMIN) 500 MCG tablet Take 500 mcg by mouth at bedtime.     Alveda Reasons 20 MG TABS tablet TAKE 1 TABLET (20 MG TOTAL) BY MOUTH DAILY. 90 tablet 3  . traMADol (ULTRAM) 50 MG tablet Take 1 tablet (50 mg total) by mouth every 6 (six) hours as needed (pain). (Patient  not taking: Reported on 01/08/2017) 60 tablet 5   No current facility-administered medications for this visit.     Physical Exam: Vitals:   03/10/17 0955  BP: 127/78  Pulse: 74  SpO2: 94%  Weight: 227 lb 3.2 oz (103.1 kg)  Height: 6' (1.829 m)    GEN- The patient is well appearing, alert and oriented x 3 today.   Head- normocephalic, atraumatic Eyes-  Sclera clear, conjunctiva pink Ears- hearing intact Oropharynx- clear Lungs- Clear to ausculation bilaterally, normal work of breathing Heart- Regular rate and rhythm, no murmurs, rubs or gallops, PMI not laterally displaced GI- soft, NT, ND, + BS Extremities- no clubbing, cyanosis, or edema  EKG tracing ordered today is personally reviewed and shows sinus rhythm 74 bpm, PR 168 msec, RBBB  Assessment and Plan:  1. Paroxysmal atrial fibrillation Doing well s/p ablation chads2vasc score is 4.  He also had prior DVT Would continue long term anticoagulation Continue tikosyn for now.  2. OSA Compliance with CPAP advised  3. Nonischemic CM Hopefully, EF will recover with sinus rhythm Repeat echo upon return  4. HTN Stable No change required today  Return to see me in 3 months  [name] MD, [facility] 03/10/2017 10:30 AM

## 2017-03-21 ENCOUNTER — Ambulatory Visit: Payer: Medicare Other | Admitting: Adult Health

## 2017-03-24 ENCOUNTER — Encounter: Payer: Self-pay | Admitting: Adult Health

## 2017-03-24 ENCOUNTER — Ambulatory Visit: Payer: Medicare Other | Admitting: Adult Health

## 2017-03-24 DIAGNOSIS — G4733 Obstructive sleep apnea (adult) (pediatric): Secondary | ICD-10-CM | POA: Diagnosis not present

## 2017-03-24 DIAGNOSIS — E669 Obesity, unspecified: Secondary | ICD-10-CM | POA: Insufficient documentation

## 2017-03-24 NOTE — Assessment & Plan Note (Signed)
Well controlled on CPAP   Plan  Patient Instructions  Continue on CPAP At bedtime  .  Work on healthy weight .  Do not drive if sleepy .  follow up Dr. Elsworth Soho  In 1 year and As needed

## 2017-03-24 NOTE — Progress Notes (Signed)
@Patient  ID: Connor [name], male    DOB: 1933-12-21, 83 y.o.   MRN: [id number]  Chief Complaint  Patient presents with  . Follow-up    OSA    Referring provider: Elayne Snare, MD  HPI: 83 year old male followed for obstructive sleep apnea Patient has recurrent atrial fib.  Status post ablation June 2014 and again February 2017, August 2018.  DVT after right hip placement February 2012 Nasal injury as a child with chronic nasal blockage.   Significant tests/ events  PSG 05/2013 Moderate OSA with AHI 21/h 07/2013 CPAP of 11 centimeters with a large full face mask Download -residual AHI 5/h on 11 cm, no leak, good usage 6h  03/24/2017 Follow up : OSA  Patient presents for a one-year follow-up.  Patient has known moderate sleep apnea.  He is on CPAP at bedtime.  Patient says he is doing okay on CPAP wears it most nights.  He tries to wear it for at least 4 hours each night.   he feels somewhat rested.   Does not like CPAP , but says he wears it each night and gets in 4hr .  Download shows good compliance at 97%.  Average usage at 4 hours.  Patient is on CPAP 11 cm H2O.  AHI 1.4.  Minimal leaks.  Allergies  Allergen Reactions  . Penicillins Itching, Swelling and Other (See Comments)    Has patient had a PCN reaction causing immediate rash, facial/tongue/throat swelling, SOB or lightheadedness with hypotension: Yes Has patient had a PCN reaction causing severe rash involving mucus membranes or skin necrosis: No Has patient had a PCN reaction that required hospitalization No Has patient had a PCN reaction occurring within the last 10 years: No If all of the above answers are "NO", then may proceed with Cephalosporin use.     Immunization History  Administered Date(s) Administered  . Influenza Split 02/03/2013  . Influenza, High Dose Seasonal PF 02/03/2017  . Influenza,inj,Quad PF,6+ Mos 02/28/2015  . Influenza-Unspecified 02/24/2014, 01/09/2016  . Pneumococcal Conjugate-13  09/16/2013  . Pneumococcal Polysaccharide-23 09/20/2015  . Tdap 11/08/2016  . Zoster 06/16/2006    Past Medical History:  Diagnosis Date  . Abnormal nuclear cardiac imaging test 1/13   Abnormal stress test 05/2011, but cardiac cath 06/2011 showing normal coronary anatomy.  . Anemia   . Atrial flutter [facility])    s/p ablation in March 2012  . BPH (benign prostatic hyperplasia)   . Chronic anticoagulation    on Xarelto for afib  . DVT (deep venous thrombosis) ([place]) 2/12   in setting of R hip surgery, he developed R femoral DVT  . Hyperlipidemia   . Hypertension   . Obstructive sleep apnea    compliant with CPAP.  managed by Dr Elsworth Soho.  . Osteoarthritis   . Paroxysmal atrial fibrillation ([place]) 9/12   a. documented by Dr Barrett Shell on office EKG 9/12. b. maintained on Tikosyn and Xarelto. c. s/p DCCV 10/2012.  Marland Kitchen Peptic ulcer disease   . Prediabetes   . Premature ventricular contraction   . RBBB (right bundle branch block)   . Vitamin D deficiency     Tobacco History: Social History   Tobacco Use  Smoking Status Former Smoker  . Packs/day: 1.00  . Years: 30.00  . Pack years: 30.00  . Types: Cigarettes  . Last attempt to quit: 05/06/1978  . Years since quitting: 38.9  Smokeless Tobacco Never Used   Counseling given: Not Answered   Outpatient Encounter Medications  as of 03/24/2017  Medication Sig  . acetaminophen (TYLENOL) 500 MG tablet Take 1,000 mg by mouth 2 (two) times daily as needed for moderate pain.  Marland Kitchen atorvastatin (LIPITOR) 10 MG tablet TAKE 1 TABLET BY MOUTH EVERY DAY  . bacitracin-polymyxin b (POLYSPORIN) ointment Apply 1 application topically daily as needed (wound care).  . Carboxymethylcellulose Sodium (THERATEARS OP) Apply 1 drop to eye daily as needed (dry eyes).  . Cholecalciferol (VITAMIN D3) 5000 UNITS TABS Take 5,000 Units by mouth See admin instructions. Take 5000 units by mouth every 4 days  . clindamycin (CLEOCIN T) 1 % external solution Apply 1 application  topically daily as needed (for bumps in scalp). Apply to scalp  . Cranberry 1000 MG CAPS Take 2 capsules at bedtime by mouth.  . docusate sodium (COLACE) 100 MG capsule Take 100 mg daily as needed by mouth (constipation).   . dofetilide (TIKOSYN) 500 MCG capsule TAKE 1 CAPSULE BY MOUTH EVERY 12 HOURS  . metoprolol succinate (TOPROL-XL) 50 MG 24 hr tablet TAKE 1 TABLET BY MOUTH IN THE MORNING AND 1/2 TABLET AT BEDTIME  . metoprolol tartrate (LOPRESSOR) 25 MG tablet TAKE 1 TABLET BY MOUTH EVERY 6 HOURS AS NEEDED FOR FAST HEART RATES  . Multiple Vitamins-Minerals (ICAPS PO) Take 1 tablet by mouth 2 (two) times daily.   . Multiple Vitamins-Minerals (MULTIVITAMIN) LIQD Take 5 mLs by mouth 2 (two) times a week.  . Omega-3 Fatty Acids (OMEGA 3 PO) Take 3 capsules by mouth every evening.   Marland Kitchen omeprazole (PRILOSEC) 20 MG capsule Take 20 mg by mouth daily.   . potassium chloride SA (KLOR-CON M20) 20 MEQ tablet TAKE 2 TABLETS (40MEQ) BY MOUTH IN THE MORNING AND 2 TABLETS (40MEQ) IN THE EVENING.  . tamsulosin (FLOMAX) 0.4 MG CAPS capsule TAKE ONE CAPSULE BY MOUTH EVERY DAY AFTER SAME MEAL  . traMADol (ULTRAM) 50 MG tablet Take 1 tablet (50 mg total) by mouth every 6 (six) hours as needed (pain).  . vitamin B-12 (CYANOCOBALAMIN) 500 MCG tablet Take 500 mcg by mouth at bedtime.   Alveda Reasons 20 MG TABS tablet TAKE 1 TABLET (20 MG TOTAL) BY MOUTH DAILY.   No facility-administered encounter medications on file as of 03/24/2017.      Review of Systems  Constitutional:   No  weight loss, night sweats,  Fevers, chills,fatigue, or  lassitude.  HEENT:   No headaches,  Difficulty swallowing,  Tooth/dental problems, or  Sore throat,                No sneezing, itching, ear ache, nasal congestion, post nasal drip,   CV:  No chest pain,  Orthopnea, PND, swelling in lower extremities, anasarca, dizziness, palpitations, syncope.   GI  No heartburn, indigestion, abdominal pain, nausea, vomiting, diarrhea, change in  bowel habits, loss of appetite, bloody stools.   Resp: No shortness of breath with exertion or at rest.  No excess mucus, no productive cough,  No non-productive cough,  No coughing up of blood.  No change in color of mucus.  No wheezing.  No chest wall deformity  Skin: no rash or lesions.  GU: no dysuria, change in color of urine, no urgency or frequency.  No flank pain, no hematuria   MS:  No joint pain or swelling.  No decreased range of motion.  No back pain.    Physical Exam  BP 122/66 (BP Location: Left Arm, Cuff Size: Normal)   Pulse 72   Ht 6' (1.829 m)  Wt 232 lb (105.2 kg)   SpO2 92%   BMI 31.46 kg/m   GEN: A/Ox3; pleasant , NAD, obese    HEENT:  [place]/AT,  EACs-clear, TMs-wnl, NOSE-clear, THROAT-clear, no lesions, no postnasal drip or exudate noted.   NECK:  Supple w/ fair ROM; no JVD; normal carotid impulses w/o bruits; no thyromegaly or nodules palpated; no lymphadenopathy.    RESP  Clear  P & A; w/o, wheezes/ rales/ or rhonchi. no accessory muscle use, no dullness to percussion  CARD:  RRR, no m/r/g, tr  peripheral edema, pulses intact, no cyanosis or clubbing.  GI:   Soft & nt; nml bowel sounds; no organomegaly or masses detected.   Musco: Warm bil, no deformities or joint swelling noted.   Neuro: alert, no focal deficits noted.    Skin: Warm, no lesions or rashes    Lab Results:  CBC  BMET   BNP No results found for: BNP  ProBNP  Imaging: No results found.   Assessment & Plan:   OSA (obstructive sleep apnea) Well controlled on CPAP   Plan  Patient Instructions  Continue on CPAP At bedtime  .  Work on healthy weight .  Do not drive if sleepy .  follow up Dr. Elsworth Soho  In 1 year and As needed       Obesity Wt loss      Rexene Edison, NP 03/24/2017

## 2017-03-24 NOTE — Patient Instructions (Signed)
Continue on CPAP At bedtime  .  Work on healthy weight .  Do not drive if sleepy .  follow up Dr. Elsworth Soho  In 1 year and As needed

## 2017-03-24 NOTE — Assessment & Plan Note (Signed)
Wt loss  

## 2017-04-03 ENCOUNTER — Other Ambulatory Visit: Payer: Self-pay | Admitting: Endocrinology

## 2017-04-09 ENCOUNTER — Other Ambulatory Visit (INDEPENDENT_AMBULATORY_CARE_PROVIDER_SITE_OTHER): Payer: Medicare Other

## 2017-04-09 DIAGNOSIS — Z1211 Encounter for screening for malignant neoplasm of colon: Secondary | ICD-10-CM

## 2017-04-11 LAB — FECAL OCCULT BLOOD, IMMUNOCHEMICAL: Fecal Occult Bld: NEGATIVE

## 2017-04-17 ENCOUNTER — Encounter: Payer: Self-pay | Admitting: Endocrinology

## 2017-05-06 HISTORY — PX: CATARACT EXTRACTION W/ INTRAOCULAR LENS  IMPLANT, BILATERAL: SHX1307

## 2017-05-26 ENCOUNTER — Encounter: Payer: Self-pay | Admitting: *Deleted

## 2017-05-27 ENCOUNTER — Other Ambulatory Visit: Payer: Self-pay | Admitting: Endocrinology

## 2017-05-28 ENCOUNTER — Other Ambulatory Visit: Payer: Self-pay

## 2017-06-11 ENCOUNTER — Other Ambulatory Visit: Payer: Self-pay

## 2017-06-11 ENCOUNTER — Encounter: Payer: Self-pay | Admitting: Internal Medicine

## 2017-06-11 ENCOUNTER — Ambulatory Visit: Payer: Medicare Other | Admitting: Internal Medicine

## 2017-06-11 ENCOUNTER — Ambulatory Visit (HOSPITAL_COMMUNITY): Payer: Medicare Other | Attending: Cardiology

## 2017-06-11 VITALS — BP 124/76 | HR 75 | Ht 72.0 in | Wt 229.0 lb

## 2017-06-11 DIAGNOSIS — I481 Persistent atrial fibrillation: Secondary | ICD-10-CM

## 2017-06-11 DIAGNOSIS — I428 Other cardiomyopathies: Secondary | ICD-10-CM

## 2017-06-11 DIAGNOSIS — I4819 Other persistent atrial fibrillation: Secondary | ICD-10-CM

## 2017-06-11 DIAGNOSIS — I10 Essential (primary) hypertension: Secondary | ICD-10-CM | POA: Diagnosis not present

## 2017-06-11 DIAGNOSIS — I420 Dilated cardiomyopathy: Secondary | ICD-10-CM | POA: Diagnosis not present

## 2017-06-11 DIAGNOSIS — I5030 Unspecified diastolic (congestive) heart failure: Secondary | ICD-10-CM | POA: Insufficient documentation

## 2017-06-11 DIAGNOSIS — I480 Paroxysmal atrial fibrillation: Secondary | ICD-10-CM

## 2017-06-11 NOTE — Progress Notes (Signed)
PCP: Elayne Snare, MD   Primary EP: Dr Nena Polio III is a 84 y.o. male who presents today for routine electrophysiology followup.  Since last being seen in our clinic, the patient reports doing very well.  Today, he denies symptoms of palpitations, chest pain, shortness of breath,  lower extremity edema, dizziness, presyncope, or syncope.  The patient is otherwise without complaint today.   Past Medical History:  Diagnosis Date  . Abnormal nuclear cardiac imaging test 1/13   Abnormal stress test 05/2011, but cardiac cath 06/2011 showing normal coronary anatomy.  . Anemia   . Atrial flutter [facility])    s/p ablation in March 2012  . BPH (benign prostatic hyperplasia)   . Chronic anticoagulation    on Xarelto for afib  . DVT (deep venous thrombosis) ([place]) 2/12   in setting of R hip surgery, he developed R femoral DVT  . Hyperlipidemia   . Hypertension   . Obstructive sleep apnea    compliant with CPAP.  managed by Dr Elsworth Soho.  . Osteoarthritis   . Paroxysmal atrial fibrillation ([place]) 9/12   a. documented by Dr Barrett Shell on office EKG 9/12. b. maintained on Tikosyn and Xarelto. c. s/p DCCV 10/2012.  Marland Kitchen Peptic ulcer disease   . Prediabetes   . Premature ventricular contraction   . RBBB (right bundle branch block)   . Vitamin D deficiency    Past Surgical History:  Procedure Laterality Date  . ABLATION OF DYSRHYTHMIC FOCUS  08/29/2015  . ATRIAL FIBRILLATION ABLATION  12/05/2016  . ATRIAL FIBRILLATION ABLATION N/A 12/05/2016   Procedure: Atrial Fibrillation Ablation;  Surgeon: [name], MD;  Location: [place] CV LAB;  Service: Cardiovascular;  Laterality: N/A;  . ATRIAL FLUTTER ABLATION  09/2010   by Greggory Brandy  . CARDIAC CATHETERIZATION    . CARDIOVERSION N/A 10/28/2012   Procedure: CARDIOVERSION;  Surgeon: Burnell Blanks, MD;  Location: [place];  Service: Cardiovascular;  Laterality: N/A;  . ELECTROPHYSIOLOGIC STUDY N/A 08/29/2015   Procedure: Atrial Fibrillation Ablation;   Surgeon: [name], MD;  Location: [place] CV LAB;  Service: Cardiovascular;  Laterality: N/A;  . JOINT REPLACEMENT    . LEFT HEART CATHETERIZATION WITH CORONARY ANGIOGRAM N/A 06/07/2011   Procedure: LEFT HEART CATHETERIZATION WITH CORONARY ANGIOGRAM;  Surgeon: Peter M Martinique, MD;  Location: [facility] CATH LAB;  Service: Cardiovascular;  Laterality: N/A;  . TOTAL HIP ARTHROPLASTY  06/04/10   RIGHT HIP  . TOTAL KNEE ARTHROPLASTY    . TRANSURETHRAL RESECTION OF PROSTATE      ROS- all systems are reviewed and negatives except as per HPI above  Current Outpatient Medications  Medication Sig Dispense Refill  . acetaminophen (TYLENOL) 500 MG tablet Take 1,000 mg by mouth 2 (two) times daily as needed for moderate pain.    Marland Kitchen atorvastatin (LIPITOR) 10 MG tablet TAKE 1 TABLET BY MOUTH EVERY DAY 90 tablet 1  . bacitracin-polymyxin b (POLYSPORIN) ointment Apply 1 application topically daily as needed (wound care).    . Carboxymethylcellulose Sodium (THERATEARS OP) Apply 1 drop to eye daily as needed (dry eyes).    . Cholecalciferol (VITAMIN D3) 5000 UNITS TABS Take 5,000 Units by mouth See admin instructions. Take 5000 units by mouth every 4 days    . clindamycin (CLEOCIN T) 1 % external solution Apply 1 application topically daily as needed (for bumps in scalp). Apply to scalp  2  . Cranberry 1000 MG CAPS Take 2 capsules at bedtime by mouth.    Marland Kitchen  docusate sodium (COLACE) 100 MG capsule Take 100 mg daily as needed by mouth (constipation).     . dofetilide (TIKOSYN) 500 MCG capsule TAKE 1 CAPSULE BY MOUTH EVERY 12 HOURS 180 capsule 3  . metoprolol succinate (TOPROL-XL) 50 MG 24 hr tablet TAKE 1 TABLET BY MOUTH IN THE MORNING AND 1/2 TABLET AT BEDTIME 45 tablet 6  . metoprolol tartrate (LOPRESSOR) 25 MG tablet TAKE 1 TABLET BY MOUTH EVERY 6 HOURS AS NEEDED FOR FAST HEART RATES 30 tablet 6  . Multiple Vitamins-Minerals (ICAPS PO) Take 1 tablet by mouth 2 (two) times daily.     . Multiple Vitamins-Minerals  (MULTIVITAMIN) LIQD Take 5 mLs by mouth 2 (two) times a week.    . Omega-3 Fatty Acids (OMEGA 3 PO) Take 3 capsules by mouth every evening.     Marland Kitchen omeprazole (PRILOSEC) 20 MG capsule Take 20 mg by mouth daily.     . potassium chloride SA (KLOR-CON M20) 20 MEQ tablet TAKE 2 TABLETS (40MEQ) BY MOUTH IN THE MORNING AND 2 TABLETS (40MEQ) IN THE EVENING. 360 tablet 0  . tamsulosin (FLOMAX) 0.4 MG CAPS capsule TAKE ONE CAPSULE BY MOUTH EVERY DAY AFTER SAME MEAL 90 capsule 1  . traMADol (ULTRAM) 50 MG tablet Take 1 tablet (50 mg total) by mouth every 6 (six) hours as needed (pain). 60 tablet 5  . vitamin B-12 (CYANOCOBALAMIN) 500 MCG tablet Take 500 mcg by mouth at bedtime.     Alveda Reasons 20 MG TABS tablet TAKE 1 TABLET (20 MG TOTAL) BY MOUTH DAILY. 90 tablet 3   No current facility-administered medications for this visit.     Physical Exam: Vitals:   06/11/17 1121  BP: 124/76  Pulse: 75  Weight: 229 lb (103.9 kg)  Height: 6' (1.829 m)    GEN- The patient is well appearing, alert and oriented x 3 today.   Head- normocephalic, atraumatic Eyes-  Sclera clear, conjunctiva pink Ears- hearing intact Oropharynx- clear Lungs- Clear to ausculation bilaterally, normal work of breathing Heart- Regular rate and rhythm, no murmurs, rubs or gallops, PMI not laterally displaced GI- soft, NT, ND, + BS Extremities- no clubbing, cyanosis, or edema  EKG tracing ordered today is personally reviewed and shows sinus rhythm, RBBB, PR 160 msec, QRS 146 msec, Qtc 527 msec  Assessment and Plan:  1. Paroxysmal atrial fibrillation Doing well s/p ablation chads2vasc score is 4.  Continue long term anticoagulation stop tikosyn today  2. Nonischemic CM Echo today pending Hopefully his EF will continue to improve  3. HTN Stable No change required today  4. OSA Compliance with CPAP advised  5. Hypokalemia Bmet, mg today Hopefully we could wean KDur once off tikosyn  Return to see me in 3  months  [name] MD, [facility] 06/11/2017 12:14 PM

## 2017-06-11 NOTE — Patient Instructions (Signed)
Medication Instructions:  Your physician has recommended you make the following change in your medication:   STOP: Tikosyn  Labwork: TODAY: BMET, MAG  Testing/Procedures: None ordered  Follow-Up: Your physician recommends that you schedule a follow-up appointment in: 3 months with Dr. Rayann Heman   Any Other Special Instructions Will Be Listed Below (If Applicable).     If you need a refill on your cardiac medications before your next appointment, please call your pharmacy.

## 2017-06-12 LAB — BASIC METABOLIC PANEL
BUN/Creatinine Ratio: 14 (ref 10–24)
BUN: 11 mg/dL (ref 8–27)
CALCIUM: 8.9 mg/dL (ref 8.6–10.2)
CHLORIDE: 107 mmol/L — AB (ref 96–106)
CO2: 25 mmol/L (ref 20–29)
Creatinine, Ser: 0.78 mg/dL (ref 0.76–1.27)
GFR, EST AFRICAN AMERICAN: 98 mL/min/{1.73_m2} (ref >59)
GFR, EST NON AFRICAN AMERICAN: 85 mL/min/{1.73_m2} (ref >59)
Glucose: 106 mg/dL — ABNORMAL HIGH (ref 65–99)
POTASSIUM: 4.7 mmol/L (ref 3.5–5.2)
SODIUM: 146 mmol/L — AB (ref 134–144)

## 2017-06-12 LAB — MAGNESIUM: Magnesium: 2.4 mg/dL — ABNORMAL HIGH (ref 1.6–2.3)

## 2017-06-16 ENCOUNTER — Other Ambulatory Visit (HOSPITAL_COMMUNITY): Payer: Self-pay | Admitting: *Deleted

## 2017-06-16 MED ORDER — POTASSIUM CHLORIDE CRYS ER 20 MEQ PO TBCR: EXTENDED_RELEASE_TABLET | ORAL | 0 refills | Status: DC

## 2017-07-24 ENCOUNTER — Telehealth: Payer: Self-pay | Admitting: Endocrinology

## 2017-07-30 ENCOUNTER — Other Ambulatory Visit (HOSPITAL_COMMUNITY): Payer: Self-pay | Admitting: Nurse Practitioner

## 2017-08-05 ENCOUNTER — Other Ambulatory Visit (HOSPITAL_COMMUNITY): Payer: Self-pay | Admitting: Nurse Practitioner

## 2017-09-09 ENCOUNTER — Other Ambulatory Visit (HOSPITAL_COMMUNITY): Payer: Self-pay | Admitting: Nurse Practitioner

## 2017-09-15 ENCOUNTER — Other Ambulatory Visit: Payer: Self-pay | Admitting: Endocrinology

## 2017-09-17 ENCOUNTER — Ambulatory Visit: Payer: Medicare Other | Admitting: Internal Medicine

## 2017-09-24 ENCOUNTER — Ambulatory Visit: Payer: Medicare Other | Admitting: Internal Medicine

## 2017-09-24 ENCOUNTER — Encounter: Payer: Self-pay | Admitting: Internal Medicine

## 2017-09-24 VITALS — BP 126/72 | HR 72 | Ht 72.0 in | Wt 229.0 lb

## 2017-09-24 DIAGNOSIS — I10 Essential (primary) hypertension: Secondary | ICD-10-CM | POA: Diagnosis not present

## 2017-09-24 DIAGNOSIS — I480 Paroxysmal atrial fibrillation: Secondary | ICD-10-CM | POA: Diagnosis not present

## 2017-09-24 DIAGNOSIS — I428 Other cardiomyopathies: Secondary | ICD-10-CM

## 2017-09-24 DIAGNOSIS — G4733 Obstructive sleep apnea (adult) (pediatric): Secondary | ICD-10-CM | POA: Diagnosis not present

## 2017-09-24 MED ORDER — POTASSIUM CHLORIDE CRYS ER 20 MEQ PO TBCR: 20.0000 meq | EXTENDED_RELEASE_TABLET | Freq: Every day | ORAL | 3 refills | Status: DC

## 2017-09-24 MED ORDER — METOPROLOL SUCCINATE ER 50 MG PO TB24: 50.0000 mg | ORAL_TABLET | Freq: Every day | ORAL | 3 refills | Status: DC

## 2017-09-24 NOTE — Patient Instructions (Addendum)
Medication Instructions:  Your physician has recommended you make the following change in your medication:  1.  Decrease your potassium 20 meq-Take only ONE tablet by mouth daily. 2.  Decrease your Toprol XL 50 mg- take one tablet by mouth daily  Labwork: Please come in for repeat blood work in 2 weeks:  BMET. Please schedule.  Testing/Procedures: None ordered.  Follow-Up: Your physician wants you to follow-up in: 3 months with Dr. Rayann Heman.      Any Other Special Instructions Will Be Listed Below (If Applicable).  If you need a refill on your cardiac medications before your next appointment, please call your pharmacy.

## 2017-09-24 NOTE — Progress Notes (Signed)
PCP: Elayne Snare, MD   Primary EP: Dr Nena Polio III is a 84 y.o. male who presents today for routine electrophysiology followup.  Since last being seen in our clinic, the patient reports doing very well.  No afib.  + fatigue.  He is compliant with CPAP.  Today, he denies symptoms of palpitations, chest pain, shortness of breath,  lower extremity edema, dizziness, presyncope, or syncope.  The patient is otherwise without complaint today.   Past Medical History:  Diagnosis Date  . Abnormal nuclear cardiac imaging test 1/13   Abnormal stress test 05/2011, but cardiac cath 06/2011 showing normal coronary anatomy.  . Anemia   . Atrial flutter [facility])    s/p ablation in March 2012  . BPH (benign prostatic hyperplasia)   . Chronic anticoagulation    on Xarelto for afib  . DVT (deep venous thrombosis) ([place]) 2/12   in setting of R hip surgery, he developed R femoral DVT  . Hyperlipidemia   . Hypertension   . Obstructive sleep apnea    compliant with CPAP.  managed by Dr Elsworth Soho.  . Osteoarthritis   . Paroxysmal atrial fibrillation ([place]) 9/12   a. documented by Dr Barrett Shell on office EKG 9/12. b. maintained on Tikosyn and Xarelto. c. s/p DCCV 10/2012.  Marland Kitchen Peptic ulcer disease   . Prediabetes   . Premature ventricular contraction   . RBBB (right bundle branch block)   . Vitamin D deficiency    Past Surgical History:  Procedure Laterality Date  . ABLATION OF DYSRHYTHMIC FOCUS  08/29/2015  . ATRIAL FIBRILLATION ABLATION  12/05/2016  . ATRIAL FIBRILLATION ABLATION N/A 12/05/2016   Procedure: Atrial Fibrillation Ablation;  Surgeon: [name], MD;  Location: [place] CV LAB;  Service: Cardiovascular;  Laterality: N/A;  . ATRIAL FLUTTER ABLATION  09/2010   by Greggory Brandy  . CARDIAC CATHETERIZATION    . CARDIOVERSION N/A 10/28/2012   Procedure: CARDIOVERSION;  Surgeon: Burnell Blanks, MD;  Location: [place];  Service: Cardiovascular;  Laterality: N/A;  . ELECTROPHYSIOLOGIC STUDY N/A  08/29/2015   Procedure: Atrial Fibrillation Ablation;  Surgeon: [name], MD;  Location: [place] CV LAB;  Service: Cardiovascular;  Laterality: N/A;  . JOINT REPLACEMENT    . LEFT HEART CATHETERIZATION WITH CORONARY ANGIOGRAM N/A 06/07/2011   Procedure: LEFT HEART CATHETERIZATION WITH CORONARY ANGIOGRAM;  Surgeon: Peter M Martinique, MD;  Location: [facility] CATH LAB;  Service: Cardiovascular;  Laterality: N/A;  . TOTAL HIP ARTHROPLASTY  06/04/10   RIGHT HIP  . TOTAL KNEE ARTHROPLASTY    . TRANSURETHRAL RESECTION OF PROSTATE      ROS- all systems are reviewed and negatives except as per HPI above  Current Outpatient Medications  Medication Sig Dispense Refill  . acetaminophen (TYLENOL) 500 MG tablet Take 1,000 mg by mouth 2 (two) times daily as needed for moderate pain.    Marland Kitchen atorvastatin (LIPITOR) 10 MG tablet TAKE 1 TABLET BY MOUTH EVERY DAY 90 tablet 1  . bacitracin-polymyxin b (POLYSPORIN) ointment Apply 1 application topically daily as needed (wound care).    . Carboxymethylcellulose Sodium (THERATEARS OP) Apply 1 drop to eye daily as needed (dry eyes).    . Cholecalciferol (VITAMIN D3) 5000 UNITS TABS Take 5,000 Units by mouth See admin instructions. Take 5000 units by mouth every 4 days    . clindamycin (CLEOCIN T) 1 % external solution Apply 1 application topically daily as needed (for bumps in scalp). Apply to scalp  2  .  Cranberry 1000 MG CAPS Take 2 capsules at bedtime by mouth.    . docusate sodium (COLACE) 100 MG capsule Take 100 mg daily as needed by mouth (constipation).     . metoprolol succinate (TOPROL-XL) 50 MG 24 hr tablet TAKE 1 TABLET BY MOUTH IN THE MORNING AND 1/2 TABLET AT BEDTIME 45 tablet 6  . metoprolol tartrate (LOPRESSOR) 25 MG tablet TAKE 1 TABLET BY MOUTH EVERY 6 HOURS AS NEEDED FOR FAST HEART RATES 30 tablet 6  . Multiple Vitamins-Minerals (ICAPS PO) Take 1 tablet by mouth 2 (two) times daily.     . Multiple Vitamins-Minerals (MULTIVITAMIN) LIQD Take 5 mLs by mouth  2 (two) times a week.    . Omega-3 Fatty Acids (OMEGA 3 PO) Take 3 capsules by mouth every evening.     Marland Kitchen omeprazole (PRILOSEC) 20 MG capsule Take 20 mg by mouth daily.     . potassium chloride SA (KLOR-CON M20) 20 MEQ tablet TAKE 2 TABLETS (40MEQ) BY MOUTH IN THE MORNING AND 2 TABLETS (40MEQ) IN THE EVENING. 360 tablet 2  . tamsulosin (FLOMAX) 0.4 MG CAPS capsule TAKE ONE CAPSULE BY MOUTH EVERY DAY AFTER SAME MEAL 90 capsule 1  . vitamin B-12 (CYANOCOBALAMIN) 500 MCG tablet Take 500 mcg by mouth at bedtime.     Alveda Reasons 20 MG TABS tablet TAKE 1 TABLET (20 MG TOTAL) BY MOUTH DAILY. 90 tablet 3   No current facility-administered medications for this visit.     Physical Exam: Vitals:   09/24/17 1127  BP: 126/72  Pulse: 72  Weight: 229 lb (103.9 kg)  Height: 6' (1.829 m)    GEN- The patient is well appearing, alert and oriented x 3 today.   Head- normocephalic, atraumatic Eyes-  Sclera clear, conjunctiva pink Ears- hearing intact Oropharynx- clear Lungs- Clear to ausculation bilaterally, normal work of breathing Heart- Regular rate and rhythm, no murmurs, rubs or gallops, PMI not laterally displaced GI- soft, NT, ND, + BS Extremities- no clubbing, cyanosis, or edema  Wt Readings from Last 3 Encounters:  09/24/17 229 lb (103.9 kg)  06/11/17 229 lb (103.9 kg)  03/24/17 232 lb (105.2 kg)    EKG tracing ordered today is personally reviewed and shows sinus rhythm with PVCs, RBBB, QTc 473 msec  Assessment and Plan:  1. Paroxysmal atrial fibrillation Doing well s/p ablation off tikosyn chads2vasc score is 4.  Kardia tracings reviewed today personally with him.  No afib detected. Reduce toprol to 50mg  daily.  Consider reducing further upon return  2. Nonischemic CM EF 45-50% by echo 06/11/17 (reviewed with patient today)  3. HTN Stable No change required today  4. OSA Compliance with CPAP encouraged  5. Hypokalemia Labs 06/11/17 reviewed Reduce KDur to 20 meq daily.   Return in 2 weeks for BMET Consider stopping KDur upon return if K remains within range  Return in 3 months  [name] MD, [facility] 09/24/2017 11:40 AM

## 2017-10-14 ENCOUNTER — Other Ambulatory Visit: Payer: Medicare Other

## 2017-10-14 DIAGNOSIS — G4733 Obstructive sleep apnea (adult) (pediatric): Secondary | ICD-10-CM

## 2017-10-14 DIAGNOSIS — I428 Other cardiomyopathies: Secondary | ICD-10-CM

## 2017-10-14 DIAGNOSIS — I10 Essential (primary) hypertension: Secondary | ICD-10-CM

## 2017-10-14 DIAGNOSIS — I480 Paroxysmal atrial fibrillation: Secondary | ICD-10-CM

## 2017-10-14 LAB — BASIC METABOLIC PANEL
BUN / CREAT RATIO: 22 (ref 10–24)
BUN: 16 mg/dL (ref 8–27)
CO2: 24 mmol/L (ref 20–29)
CREATININE: 0.72 mg/dL — AB (ref 0.76–1.27)
Calcium: 8.6 mg/dL (ref 8.6–10.2)
Chloride: 106 mmol/L (ref 96–106)
GFR calc Af Amer: 101 mL/min/{1.73_m2} (ref >59)
GFR calc non Af Amer: 87 mL/min/{1.73_m2} (ref >59)
GLUCOSE: 92 mg/dL (ref 65–99)
POTASSIUM: 4.3 mmol/L (ref 3.5–5.2)
SODIUM: 145 mmol/L — AB (ref 134–144)

## 2017-10-22 ENCOUNTER — Telehealth (HOSPITAL_COMMUNITY): Payer: Self-pay | Admitting: *Deleted

## 2017-10-22 NOTE — Telephone Encounter (Signed)
Patient called in stating he has been at the beach and has been in afib for the last 5-6 days. States his HR on his [name] is irregular HR is mostly controlled. His metoprolol was recently reduced to once a day. He will try adding the 1/2 tablet back in the morning (taking 50 at bedtime) and see if this along with returning to his normal diet will help encourage return to rhythm. If he is still out of rhythm on Monday we will bring in for EKG. Pt has been off tikosyn since February. Pt is asymptomatic with this episode of afib - states he only knew b/c of his BP machine.Pt in agreement with plan to try increasing metoprolol over weekend.

## 2017-10-27 ENCOUNTER — Encounter (HOSPITAL_COMMUNITY): Payer: Self-pay | Admitting: Nurse Practitioner

## 2017-10-27 ENCOUNTER — Ambulatory Visit (HOSPITAL_COMMUNITY)
Admission: RE | Admit: 2017-10-27 | Discharge: 2017-10-27 | Disposition: A | Discharge status: Home / Self Care | Payer: Medicare Other | Source: Ambulatory Visit | Attending: Nurse Practitioner | Admitting: Nurse Practitioner

## 2017-10-27 VITALS — BP 112/68 | HR 78 | Ht 72.0 in | Wt 229.0 lb

## 2017-10-27 DIAGNOSIS — N400 Enlarged prostate without lower urinary tract symptoms: Secondary | ICD-10-CM | POA: Insufficient documentation

## 2017-10-27 DIAGNOSIS — E785 Hyperlipidemia, unspecified: Secondary | ICD-10-CM | POA: Diagnosis not present

## 2017-10-27 DIAGNOSIS — Z96641 Presence of right artificial hip joint: Secondary | ICD-10-CM | POA: Diagnosis not present

## 2017-10-27 DIAGNOSIS — Z7901 Long term (current) use of anticoagulants: Secondary | ICD-10-CM | POA: Insufficient documentation

## 2017-10-27 DIAGNOSIS — I480 Paroxysmal atrial fibrillation: Secondary | ICD-10-CM | POA: Insufficient documentation

## 2017-10-27 DIAGNOSIS — Z87891 Personal history of nicotine dependence: Secondary | ICD-10-CM | POA: Diagnosis not present

## 2017-10-27 DIAGNOSIS — Z9889 Other specified postprocedural states: Secondary | ICD-10-CM | POA: Diagnosis not present

## 2017-10-27 DIAGNOSIS — I4510 Unspecified right bundle-branch block: Secondary | ICD-10-CM | POA: Insufficient documentation

## 2017-10-27 DIAGNOSIS — E559 Vitamin D deficiency, unspecified: Secondary | ICD-10-CM | POA: Insufficient documentation

## 2017-10-27 DIAGNOSIS — Z96659 Presence of unspecified artificial knee joint: Secondary | ICD-10-CM | POA: Insufficient documentation

## 2017-10-27 DIAGNOSIS — Z8249 Family history of ischemic heart disease and other diseases of the circulatory system: Secondary | ICD-10-CM | POA: Diagnosis not present

## 2017-10-27 DIAGNOSIS — R7303 Prediabetes: Secondary | ICD-10-CM | POA: Diagnosis not present

## 2017-10-27 DIAGNOSIS — M1990 Unspecified osteoarthritis, unspecified site: Secondary | ICD-10-CM | POA: Diagnosis not present

## 2017-10-27 DIAGNOSIS — Z8711 Personal history of peptic ulcer disease: Secondary | ICD-10-CM | POA: Insufficient documentation

## 2017-10-27 DIAGNOSIS — Z86718 Personal history of other venous thrombosis and embolism: Secondary | ICD-10-CM | POA: Insufficient documentation

## 2017-10-27 DIAGNOSIS — I10 Essential (primary) hypertension: Secondary | ICD-10-CM | POA: Insufficient documentation

## 2017-10-27 DIAGNOSIS — Z880 Allergy status to penicillin: Secondary | ICD-10-CM | POA: Insufficient documentation

## 2017-10-27 DIAGNOSIS — G4733 Obstructive sleep apnea (adult) (pediatric): Secondary | ICD-10-CM | POA: Diagnosis not present

## 2017-10-27 DIAGNOSIS — Z79899 Other long term (current) drug therapy: Secondary | ICD-10-CM | POA: Insufficient documentation

## 2017-10-27 DIAGNOSIS — Z836 Family history of other diseases of the respiratory system: Secondary | ICD-10-CM | POA: Diagnosis not present

## 2017-10-27 NOTE — Progress Notes (Signed)
Patient ID: Connor [name], male   DOB: [birth year], 84 y.o.   MRN: [id number]     Primary Care Physician: Connor Snare, MD Referring Physician: Dr. Thompson [name] [name] is a 84 y.o. male with a h/o PAF that recently underwent repeat afib ablation 08/29/15 for f/u in the afib clinic. He reports just one epiosode of afib a few days after ablation. Otherwise staying in SR, very little afib. Continues on tikosyn/xarelto without issues.He did have a torn hamstring with bleeding into left leg which caused hypotension and near syncope and was admitted to [facility]. Xarelto was held x 4 days and has resolved.  He asked to be seen in  the afib clinic, 3/12, for wakening with afib this am. He took his am meds and now is in SR. He said this has happened 3-4 early mornings over the last month.He is using his cpap and wore the entire night last night and still woke up with afib. He is taking his tikosyn/xarelto as prescribed. Small dose of BB at bedtime was added.  Pt asked to be in afib clinic, 6/14, for increased afib burden over several weeks, occurs several times a week . He has a Investment banker, operational and brings in afib strips for for review. Will last on a average of 2 hours. He continues on max dose of tikosyn and is limited on BB dose due to hypotension.  F/u afib clinic, 9/5. He underwent repeat ablation 8/2, and has done well. Has a Kardia device and it has shown a few episodes of nonsustained afib but pt unaware. He is in SR today and feels his afib burden is low.No swallowing or groin issues.  F/u in afib clinic 6/24. He went back into afib while on vacation at the beach last week. His metoprolol was increased back to usual dose(it was reduced on visit with Dr. Rayann [name], 5/22). Today, he is back in SR. He feels well.  Today, he denies symptoms of palpitations, chest pain, shortness of breath, orthopnea, PND, lower extremity edema, dizziness, presyncope, syncope, or neurologic sequela. The patient is tolerating  medications without difficulties and is otherwise without complaint today.   Past Medical History:  Diagnosis Date  . Abnormal nuclear cardiac imaging test 1/13   Abnormal stress test 05/2011, but cardiac cath 06/2011 showing normal coronary anatomy.  . Anemia   . Atrial flutter [facility])    s/p ablation in March 2012  . BPH (benign prostatic hyperplasia)   . Chronic anticoagulation    on Xarelto for afib  . DVT (deep venous thrombosis) ([place]) 2/12   in setting of R hip surgery, he developed R femoral DVT  . Hyperlipidemia   . Hypertension   . Obstructive sleep apnea    compliant with CPAP.  managed by Dr Connor [name].  . Osteoarthritis   . Paroxysmal atrial fibrillation ([place]) 9/12   a. documented by Dr Connor [name] on office EKG 9/12. b. maintained on Tikosyn and Xarelto. c. s/p DCCV 10/2012.  Marland Kitchen Peptic ulcer disease   . Prediabetes   . Premature ventricular contraction   . RBBB (right bundle branch block)   . Vitamin D deficiency    Past Surgical History:  Procedure Laterality Date  . ABLATION OF DYSRHYTHMIC FOCUS  08/29/2015  . ATRIAL FIBRILLATION ABLATION  12/05/2016  . ATRIAL FIBRILLATION ABLATION N/A 12/05/2016   Procedure: Atrial Fibrillation Ablation;  Surgeon: Connor Grayer, MD;  Location: [place] CV LAB;  Service: Cardiovascular;  Laterality: N/A;  . ATRIAL  FLUTTER ABLATION  09/2010   by Connor [name]  . CARDIAC CATHETERIZATION    . CARDIOVERSION N/A 10/28/2012   Procedure: CARDIOVERSION;  Surgeon: Connor Blanks, MD;  Location: [place];  Service: Cardiovascular;  Laterality: N/A;  . ELECTROPHYSIOLOGIC STUDY N/A 08/29/2015   Procedure: Atrial Fibrillation Ablation;  Surgeon: Connor Grayer, MD;  Location: [place] CV LAB;  Service: Cardiovascular;  Laterality: N/A;  . JOINT REPLACEMENT    . LEFT HEART CATHETERIZATION WITH CORONARY ANGIOGRAM N/A 06/07/2011   Procedure: LEFT HEART CATHETERIZATION WITH CORONARY ANGIOGRAM;  Surgeon: Connor M Martinique, MD;  Location: [facility] CATH LAB;  Service:  Cardiovascular;  Laterality: N/A;  . TOTAL HIP ARTHROPLASTY  06/04/10   RIGHT HIP  . TOTAL KNEE ARTHROPLASTY    . TRANSURETHRAL RESECTION OF PROSTATE      Current Outpatient Medications  Medication Sig Dispense Refill  . acetaminophen (TYLENOL) 500 MG tablet Take 1,000 mg by mouth 2 (two) times daily as needed for moderate pain.    Marland Kitchen atorvastatin (LIPITOR) 10 MG tablet TAKE 1 TABLET BY MOUTH EVERY DAY 90 tablet 1  . Carboxymethylcellulose Sodium (THERATEARS OP) Apply 1 drop to eye daily as needed (dry eyes).    . Cholecalciferol (VITAMIN D3) 5000 UNITS TABS Take 5,000 Units by mouth See admin instructions. Take 5000 units by mouth every 4 days    . clindamycin (CLEOCIN T) 1 % external solution Apply 1 application topically daily as needed (for bumps in scalp). Apply to scalp  2  . Cranberry 1000 MG CAPS Take 2 capsules at bedtime by mouth.    . docusate sodium (COLACE) 100 MG capsule Take 100 mg daily as needed by mouth (constipation).     . metoprolol succinate (TOPROL-XL) 50 MG 24 hr tablet Take 1 tablet (50 mg total) by mouth daily. Take with or immediately following a meal. (Patient taking differently: Take 50 mg by mouth at bedtime. ) 90 tablet 3  . metoprolol tartrate (LOPRESSOR) 25 MG tablet TAKE 1 TABLET BY MOUTH EVERY 6 HOURS AS NEEDED FOR FAST HEART RATES 30 tablet 6  . Multiple Vitamins-Minerals (ICAPS PO) Take 1 tablet by mouth 2 (two) times daily.     . Multiple Vitamins-Minerals (MULTIVITAMIN) LIQD Take 5 mLs by mouth 2 (two) times a week.    . Omega-3 Fatty Acids (OMEGA 3 PO) Take 3 capsules by mouth every evening.     Marland Kitchen omeprazole (PRILOSEC) 20 MG capsule Take 20 mg by mouth daily.     . potassium chloride SA (K-DUR,KLOR-CON) 20 MEQ tablet Take 1 tablet (20 mEq total) by mouth daily. 90 tablet 3  . tamsulosin (FLOMAX) 0.4 MG CAPS capsule TAKE ONE CAPSULE BY MOUTH EVERY DAY AFTER SAME MEAL 90 capsule 1  . vitamin B-12 (CYANOCOBALAMIN) 500 MCG tablet Take 500 mcg by mouth at  bedtime.     Alveda Reasons 20 MG TABS tablet TAKE 1 TABLET (20 MG TOTAL) BY MOUTH DAILY. 90 tablet 3   No current facility-administered medications for this encounter.     Allergies  Allergen Reactions  . Penicillins Itching, Swelling and Other (See Comments)    Has patient had a PCN reaction causing immediate rash, facial/tongue/throat swelling, SOB or lightheadedness with hypotension: Yes Has patient had a PCN reaction causing severe rash involving mucus membranes or skin necrosis: No Has patient had a PCN reaction that required hospitalization No Has patient had a PCN reaction occurring within the last 10 years: No If all of the above answers are "NO",  then may proceed with Cephalosporin use.     Social History   Socioeconomic History  . Marital status: Married    Spouse name: Not on file  . Number of children: Not on file  . Years of education: Not on file  . Highest education level: Not on file  Occupational History  . Not on file  Social Needs  . Financial resource strain: Not on file  . Food insecurity:    Worry: Not on file    Inability: Not on file  . Transportation needs:    Medical: Not on file    Non-medical: Not on file  Tobacco Use  . Smoking status: Former Smoker    Packs/day: 1.00    Years: 30.00    Pack years: 30.00    Types: Cigarettes    Last attempt to quit: 05/06/1978    Years since quitting: 39.5  . Smokeless tobacco: Never Used  Substance and Sexual Activity  . Alcohol use: Yes    Alcohol/week: 3.0 oz    Types: 5 Cans of beer per week    Comment: he has just about quit  . Drug use: No  . Sexual activity: Not on file  Lifestyle  . Physical activity:    Days per week: Not on file    Minutes per session: Not on file  . Stress: Not on file  Relationships  . Social connections:    Talks on phone: Not on file    Gets together: Not on file    Attends religious service: Not on file    Active member of club or organization: Not on file    Attends  meetings of clubs or organizations: Not on file    Relationship status: Not on file  . Intimate partner violence:    Fear of current or ex partner: Not on file    Emotionally abused: Not on file    Physically abused: Not on file    Forced sexual activity: Not on file  Other Topics Concern  . Not on file  Social History Narrative   he patient lives in [place] with his spouse.  Retired.    Family History  Problem Relation Age of Onset  . Heart attack Father   . Lung disease Mother   . Arrhythmia Sister   . Atrial fibrillation Brother   . Diabetes Neg Hx     ROS- All systems are reviewed and negative except as per the HPI above  Physical Exam: Vitals:   10/27/17 1514  BP: 112/68  Pulse: 78  Weight: 229 lb (103.9 kg)  Height: 6' (1.829 m)    GEN- The patient is well appearing, alert and oriented x 3 today.   Head- normocephalic, atraumatic Eyes-  Sclera clear, conjunctiva pink Ears- hearing intact Oropharynx- clear Neck- supple, no JVP Lymph- no cervical lymphadenopathy Lungs- Clear to ausculation bilaterally, normal work of breathing Heart- Regular rate and rhythm, no murmurs, rubs or gallops, PMI not laterally displaced GI- soft, NT, ND, + BS Extremities- no clubbing, cyanosis, or edema MS- no significant deformity or atrophy Skin- no rash or lesion Psych- euthymic mood, full affect Neuro- strength and sensation are intact  EKG- NSR with frequent PVC's, pr int 162 ms, qrs int 142 ms, qtc 530 ms (stable for pt on tikosyn with RBBB) Epic records reviewed  Assessment and Plan: 1. Paroxysmal atrial fibrillation s/p ablation Went into   afib at beach last week but with increase  in BB dose, returned to  SR Maintaining  SR since then Off Tikosyn  Continue xarelto for chadsvasc score of at least 3 Contiue metoprolol SR 25  am and  50 mg in pm  Continue cpap  F/u with Dr. Rayann [name] as scheduled in August   [place] C. Harla Mensch, [facility] [address] [place], [place] [address] [phone]

## 2017-11-11 ENCOUNTER — Other Ambulatory Visit: Payer: Self-pay | Admitting: Internal Medicine

## 2017-11-11 ENCOUNTER — Other Ambulatory Visit: Payer: Self-pay | Admitting: Endocrinology

## 2017-11-12 NOTE — Telephone Encounter (Signed)
Xarelto 20mg  refill request received; pt is 84 yrs old, Wt-103.9kg, Crea-0.72 on 10/14/17, last seen by Roderic Palau, NP on 10/27/17, CrCl-118.42ml/min; will send in refill to requested pharmacy.

## 2017-11-17 ENCOUNTER — Other Ambulatory Visit (INDEPENDENT_AMBULATORY_CARE_PROVIDER_SITE_OTHER): Payer: Medicare Other

## 2017-11-17 DIAGNOSIS — E049 Nontoxic goiter, unspecified: Secondary | ICD-10-CM

## 2017-11-17 DIAGNOSIS — D7589 Other specified diseases of blood and blood-forming organs: Secondary | ICD-10-CM

## 2017-11-17 DIAGNOSIS — E7800 Pure hypercholesterolemia, unspecified: Secondary | ICD-10-CM | POA: Diagnosis not present

## 2017-11-17 DIAGNOSIS — R7301 Impaired fasting glucose: Secondary | ICD-10-CM | POA: Diagnosis not present

## 2017-11-17 DIAGNOSIS — I10 Essential (primary) hypertension: Secondary | ICD-10-CM | POA: Diagnosis not present

## 2017-11-17 LAB — LIPID PANEL
CHOLESTEROL: 148 mg/dL (ref 0–200)
HDL: 50.2 mg/dL (ref >39.00)
LDL CALC: 84 mg/dL (ref 0–99)
NONHDL: 98.15
TRIGLYCERIDES: 69 mg/dL (ref 0.0–149.0)
Total CHOL/HDL Ratio: 3
VLDL: 13.8 mg/dL (ref 0.0–40.0)

## 2017-11-17 LAB — COMPREHENSIVE METABOLIC PANEL
ALBUMIN: 3.9 g/dL (ref 3.5–5.2)
ALT: 17 U/L (ref 0–53)
AST: 21 U/L (ref 0–37)
Alkaline Phosphatase: 54 U/L (ref 39–117)
BUN: 18 mg/dL (ref 6–23)
CHLORIDE: 108 meq/L (ref 96–112)
CO2: 27 mEq/L (ref 19–32)
Calcium: 8.8 mg/dL (ref 8.4–10.5)
Creatinine, Ser: 0.77 mg/dL (ref 0.40–1.50)
GFR: 102.87 mL/min (ref >60.00)
Glucose, Bld: 112 mg/dL — ABNORMAL HIGH (ref 70–99)
POTASSIUM: 4 meq/L (ref 3.5–5.1)
SODIUM: 142 meq/L (ref 135–145)
Total Bilirubin: 1.2 mg/dL (ref 0.2–1.2)
Total Protein: 6.5 g/dL (ref 6.0–8.3)

## 2017-11-17 LAB — URINALYSIS, ROUTINE W REFLEX MICROSCOPIC
Bilirubin Urine: NEGATIVE
NITRITE: NEGATIVE
Specific Gravity, Urine: >=1.03 — AB (ref 1.000–1.030)
Urine Glucose: NEGATIVE
Urobilinogen, UA: 0.2 (ref 0.0–1.0)
pH: 5.5 (ref 5.0–8.0)

## 2017-11-17 LAB — CBC
HCT: 41.5 % (ref 39.0–52.0)
Hemoglobin: 13.9 g/dL (ref 13.0–17.0)
MCHC: 33.5 g/dL (ref 30.0–36.0)
MCV: 102.7 fl — ABNORMAL HIGH (ref 78.0–100.0)
Platelets: 115 10*3/uL — ABNORMAL LOW (ref 150.0–400.0)
RBC: 4.04 Mil/uL — ABNORMAL LOW (ref 4.22–5.81)
RDW: 14.2 % (ref 11.5–15.5)
WBC: 4.9 10*3/uL (ref 4.0–10.5)

## 2017-11-17 LAB — HEMOGLOBIN A1C: HEMOGLOBIN A1C: 5.8 % (ref 4.6–6.5)

## 2017-11-17 LAB — TSH: TSH: 1.34 u[IU]/mL (ref 0.35–4.50)

## 2017-11-19 NOTE — Progress Notes (Signed)
Patient ID: Connor [name], male   DOB: 1934-01-02, 84 y.o.   MRN: [id number]     Subjective:    Connor [name] is here for annual preventive exam and management of chronic problems.      CHIEF COMPLAINT: Pain in finger joints  1.  For the last few months he has had pain in his finger joints particularly the second and third fingers and more on the right.  These tend to throb especially at night and are less painful during the day.  Has not taken any treatment for this  2.  Low back pain: This has been intermittent but he has more pain in right side with muscle spasm in the lower back on lying down or sitting down sometimes and not as much while walking.  No radiating pain down the leg    Risk factors for health:     Increasing age , over 73  History of mild impaired fasting glucose levels   Mild obesity: He has had difficulty losing weight   Hypercholesterolemia  Recurrent atrial fibrillation  Sleep apnea  Mild systolic LV dysfunction   PREVENTIVE CARE:   Diet: Used to trying to reduce portions, eating more low fat meals  Exercise: Was going to the gym using bike, weights,bike/ treadmill up to 40 minutes, 2-3/7 days a week.  Has not done this for over a month  History of falls: None recently  Annual hemoccults:           04/2017  Vitamin D3 supplement:                               5000 units 2/7   Colonoscopy/sigmoidoscopy 2011  Prevnar  2015  Yearly flu vaccine:                      yes    Zostavax:  2008   Pneumovax:  2017 Tetanus booster:     10/2016    Lab Results  Component Value Date   OCCULTBLD Negative 04/09/2017   OCCULTBLD NEGATIVE 04/20/2016   OCCULTBLD Negative 10/06/2014    HYPERLIPIDEMIA:  Has been maintained on Lipitor 10 mg for several years for hypercholesterolemia with baseline LDL levels up to 189 Recent lipids excellent  Lab Results  Component Value Date   CHOL 148 11/17/2017   HDL 50.20 11/17/2017   LDLCALC 84 11/17/2017   TRIG  69.0 11/17/2017   CHOLHDL 3 11/17/2017      Depression Screen  See relevant section in EMR   Review of Systems:    Weight gain: He has difficulty losing the weight This is not stable but he has not been exercising recently   Wt Readings from Last 3 Encounters:  11/20/17 227 lb 9.6 oz (103.2 kg)  10/27/17 229 lb (103.9 kg)  09/24/17 229 lb (103.9 kg)       Has history of mild hypertension, treated long-term with metoprolol ER 50 mg daily Blood pressure well controlled at home Using an arm monitor that is linked to his smart phone     Eyes: Normal vision, followed by ophthalmologist every 6 months.        ENT: Decreased hearing especially if background noise present. Using bilateral hearing aids with  good results                   Thyroid:     Has had a small multinodular  goiter since at least 1998, has been euthyroid, this has been minimally enlarged recently  Lab Results  Component Value Date   TSH 1.34 11/17/2017       No chest pain on exertion.                      Has periodic palpitations from recurrent atrial fibrillation. Controlled with Tikosyn and is on Xarelto followed by cardiologist regularly He now is using portable device to record his EKG rhythm strip linked to his smart phone He gets fatigue when he has episodes when he has fast atrial fibrillation but has not had any recurrence lately      No swelling of legs.       No shortness of breath on exertion.     Bowel habits: Fairly regular   Taking OTC Prilosec mostly prophylactically because of remote history of GI bleed possibly related to Daypro  No heartburn      Rectal bleeding/black stools: Not present.   Prior history of meatal stenosis and doing self-catheterization rarely, no difficulty with urine flow now and continues on Flomax for BPH from urologist   Nocturia may occur at times Has no  frequency/urgency.   PSA  is being checked by urologist periodically, last 1.87 done in 07/2016      Neurological: Episodes of lightheadedness/dizziness since about 2000.     Periodically he will feel symptoms of slightly dizzy but not swimmy headed and a  sensation of being off balance Has had negative vestibular evaluation previously .   He thinks that when he presses on the back of the neck on the muscle this relieves his symptoms Neck pain: This is recently better after treatment, using and contour pillow and occasionally home traction Takes Tylenol as needed   No numbness or tingling in his feet  Has had back pain from degenerated discs at L2-3, 3-4 and 4-5 levels and scoliosis. Better recently, takes tramadol only occasionally         Allergies as of 11/20/2017      Reactions   Penicillins Itching, Swelling, Other (See Comments)   Has patient had a PCN reaction causing immediate rash, facial/tongue/throat swelling, SOB or lightheadedness with hypotension: Yes Has patient had a PCN reaction causing severe rash involving mucus membranes or skin necrosis: No Has patient had a PCN reaction that required hospitalization No Has patient had a PCN reaction occurring within the last 10 years: No If all of the above answers are "NO", then may proceed with Cephalosporin use.      Medication List        Accurate as of 11/20/17 10:15 AM. Always use your most recent med list.          acetaminophen 500 MG tablet Commonly known as:  TYLENOL Take 1,000 mg by mouth 2 (two) times daily as needed for moderate pain.   atorvastatin 10 MG tablet Commonly known as:  LIPITOR TAKE 1 TABLET BY MOUTH EVERY DAY   clindamycin 1 % external solution Commonly known as:  CLEOCIN T Apply 1 application topically daily as needed (for bumps in scalp). Apply to scalp   Cranberry 1000 MG Caps Take 2 capsules at bedtime by mouth.   docusate sodium 100 MG capsule Commonly known as:  COLACE Take 100 mg daily as needed by mouth (constipation).   MULTIVITAMIN Liqd Take 5 mLs by mouth 2 (two) times  a week.   ICAPS PO Take 1 tablet by mouth 2 (  two) times daily.   metoprolol succinate 50 MG 24 hr tablet Commonly known as:  TOPROL-XL Take 1 tablet (50 mg total) by mouth daily. Take with or immediately following a meal.   metoprolol tartrate 25 MG tablet Commonly known as:  LOPRESSOR TAKE 1 TABLET BY MOUTH EVERY 6 HOURS AS NEEDED FOR FAST HEART RATES   OMEGA 3 PO Take 3 capsules by mouth every evening.   omeprazole 20 MG capsule Commonly known as:  PRILOSEC Take 20 mg by mouth daily.   potassium chloride SA 20 MEQ tablet Commonly known as:  K-DUR,KLOR-CON Take 1 tablet (20 mEq total) by mouth daily.   tamsulosin 0.4 MG Caps capsule Commonly known as:  FLOMAX TAKE ONE CAPSULE BY MOUTH EVERY DAY AFTER SAME MEAL   THERATEARS OP Apply 1 drop to eye daily as needed (dry eyes).   vitamin B-12 500 MCG tablet Commonly known as:  CYANOCOBALAMIN Take 500 mcg by mouth at bedtime.   Vitamin D3 5000 units Tabs Take 5,000 Units by mouth See admin instructions. Take 5000 units by mouth every 4 days   XARELTO 20 MG Tabs tablet Generic drug:  rivaroxaban TAKE 1 TABLET BY MOUTH EVERY DAY       Allergies:  Allergies  Allergen Reactions  . Penicillins Itching, Swelling and Other (See Comments)    Has patient had a PCN reaction causing immediate rash, facial/tongue/throat swelling, SOB or lightheadedness with hypotension: Yes Has patient had a PCN reaction causing severe rash involving mucus membranes or skin necrosis: No Has patient had a PCN reaction that required hospitalization No Has patient had a PCN reaction occurring within the last 10 years: No If all of the above answers are "NO", then may proceed with Cephalosporin use.     Past Medical History:  Diagnosis Date  . Abnormal nuclear cardiac imaging test 1/13   Abnormal stress test 05/2011, but cardiac cath 06/2011 showing normal coronary anatomy.  . Anemia   . Atrial flutter [facility])    s/p ablation in March 2012  .  BPH (benign prostatic hyperplasia)   . Chronic anticoagulation    on Xarelto for afib  . DVT (deep venous thrombosis) ([place]) 2/12   in setting of R hip surgery, he developed R femoral DVT  . Hyperlipidemia   . Hypertension   . Obstructive sleep apnea    compliant with CPAP.  managed by Dr Elsworth Soho.  . Osteoarthritis   . Paroxysmal atrial fibrillation ([place]) 9/12   a. documented by Dr Barrett Shell on office EKG 9/12. b. maintained on Tikosyn and Xarelto. c. s/p DCCV 10/2012.  Marland Kitchen Peptic ulcer disease   . Prediabetes   . Premature ventricular contraction   . RBBB (right bundle branch block)   . Vitamin D deficiency     Past Surgical History:  Procedure Laterality Date  . ABLATION OF DYSRHYTHMIC FOCUS  08/29/2015  . ATRIAL FIBRILLATION ABLATION  12/05/2016  . ATRIAL FIBRILLATION ABLATION N/A 12/05/2016   Procedure: Atrial Fibrillation Ablation;  Surgeon: [name], MD;  Location: [facility];  Service: Cardiovascular;  Laterality: N/A;  . ATRIAL FLUTTER ABLATION  09/2010   by Greggory Brandy  . CARDIAC CATHETERIZATION    . CARDIOVERSION N/A 10/28/2012   Procedure: CARDIOVERSION;  Surgeon: Burnell Blanks, MD;  Location: [facility];  Service: Cardiovascular;  Laterality: N/A;  . ELECTROPHYSIOLOGIC STUDY N/A 08/29/2015   Procedure: Atrial Fibrillation Ablation;  Surgeon: [name], MD;  Location: [place] [facility];  Service: Cardiovascular;  Laterality:  N/A;  . JOINT REPLACEMENT    . LEFT HEART CATHETERIZATION WITH CORONARY ANGIOGRAM N/A 06/07/2011   Procedure: LEFT HEART CATHETERIZATION WITH CORONARY ANGIOGRAM;  Surgeon: Peter M Martinique, MD;  Location: [facility] CATH LAB;  Service: Cardiovascular;  Laterality: N/A;  . TOTAL HIP ARTHROPLASTY  06/04/10   RIGHT HIP  . TOTAL KNEE ARTHROPLASTY    . TRANSURETHRAL RESECTION OF PROSTATE      Family History  Problem Relation Age of Onset  . Heart attack Father   . Lung disease Mother   . Arrhythmia Sister   . Atrial fibrillation Brother   . Diabetes Neg Hx       Social History:  reports that he quit smoking about 39 years ago. His smoking use included cigarettes. He has a 30.00 pack-year smoking history. He has never used smokeless tobacco. He reports that he drinks about 3.0 oz of alcohol per week. He reports that he does not use drugs.    EXAM:  BP (!) 144/70 (BP Location: Left Arm, Patient Position: Sitting, Cuff Size: Normal)   Pulse 78   Ht 6' (1.829 m)   Wt 227 lb 9.6 oz (103.2 kg)   SpO2 96%   BMI 30.87 kg/m    GENERAL:  Large build, well-nourished, pleasant and cooperative   No pallor, clubbing, cervical lymphadenopathy or peripheral edema.    Skin:  no rash or other skin lesions  EYES:  Externally normal.    Fundii:  normal discs and vessels, limited exam On the left  ENT:  oral cavity and pharynx normal.   THYROID: Minimally palpable on the right, smooth  CAROTIDS:  Normal character; no bruit.  HEART: Heart rate regular  Normal apex, S1 normal, S2 normal, no S3/S4 ; no murmur.  CHEST:  Normal shape.  Lungs:  Vescicular breath sounds bilaterally.   No crepitations/ wheeze.  ABDOMEN:  No distention.  Liver and spleen not palpable.  No other mass or tenderness.  RECTAL exam :   Deferred to urologist  NEUROLOGICAL: Gait normal Motor power grossly normal on both upper and lower extremities   Biceps reflexes are normal, and ankle reflexes absent   SPINE AND JOINTS:  Mild scoliosis lumbar spine with some prominence of the L2-L3 spines. Mild paravertebral lumbar spasm on the right side present  He has enlargement and some deformity of the proximal thumb joints bilaterally especially the right and no significant swelling or tenderness of the second and third phalanges  Has lateral deviation of great toe joints bilaterally    PERIPHERAL PULSES: pedal pulses normal    Assessment/Plan:     ACUTE symptoms and problems:  JOINT pain in fingers: This is related to osteoarthritis and he will be treated with  diclofenac gel since he has only limited joints that are painful  He is asking about lumbar  paravertebral spasm which is likely related to his chronic DJD and disc disease, recommended that he start his back exercises as before as well as start regular walking, may take Tylenol or tramadol as needed   ANNUAL screening and preventive services:   Diabetes screening this indicates impaired fasting glucose, stable Nutrition counseling : low fat diet  Vaccines: Up-to-date  Screening for colon cancer:   Colonoscopy not indicated at his age Last stool Hemoccult normal in December  Exercise: Resume regular exercise regimen at least 3 days a week which he has not been doing   Active medical problems and recommendations:  Hyperlipidemia: His lipids are consistently well controlled  with LDL 77 on Lipitor 10 mg daily.  He will continue this   History of impaired fasting glucose: Glucose is 112 fasting which is higher than usual.  However A1c is still normal at 5.8 indicating primarily impaired fasting glucose without diabetes and stable level of control.  He needs continuing efforts to keep his weight down and exercise needs to be restarted.     Hypertension: Mild and blood pressure is consistently well controlled with metoprolol.  He is also monitoring at home regularly.  Also to follow-up with cardiologist regularly   Dizziness/lightheadedness: This is only mild and related to cervical spondylosis with spur formation and possible vascular compromise.  He says that he does better with self manipulation of the neck and has no significant symptoms   BPH and history of meatal stenosis: Doing well symptomatically using Flomax.  To continue followup with urologist annually   Atrial fibrillation: Recurrent, apparently needing another ablation procedure.  Heart rate usually controlled with metoprolol    Previous Small multinodular goiter: Just palpable on the right, TSH within normal     History of vitamin D deficiency: Has been on vitamin D3, 5000 unitsand will continue.     Chronic PPI therapy: He can take his Prilosec now as needed for heartburn and reflux since he has been on this for several years with no recurrence of GI bleeding   Hypercholesterolemia: This is again consistently controlled with LDL 84, no history of coronary artery disease     Elayne Snare 11/20/2017, 10:15 AM

## 2017-11-20 ENCOUNTER — Ambulatory Visit (INDEPENDENT_AMBULATORY_CARE_PROVIDER_SITE_OTHER): Payer: Medicare Other | Admitting: Endocrinology

## 2017-11-20 ENCOUNTER — Encounter: Payer: Self-pay | Admitting: Endocrinology

## 2017-11-20 VITALS — BP 144/70 | HR 78 | Ht 72.0 in | Wt 227.6 lb

## 2017-11-20 DIAGNOSIS — Z0000 Encounter for general adult medical examination without abnormal findings: Secondary | ICD-10-CM

## 2017-11-20 DIAGNOSIS — R42 Dizziness and giddiness: Secondary | ICD-10-CM | POA: Diagnosis not present

## 2017-11-20 DIAGNOSIS — M25542 Pain in joints of left hand: Secondary | ICD-10-CM

## 2017-11-20 DIAGNOSIS — E7800 Pure hypercholesterolemia, unspecified: Secondary | ICD-10-CM | POA: Diagnosis not present

## 2017-11-20 DIAGNOSIS — R7301 Impaired fasting glucose: Secondary | ICD-10-CM

## 2017-11-20 DIAGNOSIS — M25541 Pain in joints of right hand: Secondary | ICD-10-CM | POA: Diagnosis not present

## 2017-11-20 MED ORDER — DICLOFENAC SODIUM 1 % TD GEL: 2.0000 g | Freq: Two times a day (BID) | TRANSDERMAL | 1 refills | Status: DC

## 2017-11-20 NOTE — Patient Instructions (Addendum)
Check on B 12 supplement, 500mg  daily  May start taking omeprazole as needed for any heartburn or with spicy foods Continue to avoid aspirin type products or ibuprofen OTC  Make sure you have healthcare power of attorney and living will updated  Restart exercise program at the gym  Start doing the lower back exercises given by physical therapist  Start using the diclofenac gel on the painful joints of the hand as needed once or twice a day  Continue low-fat and moderate amounts of carbohydrate in meals and snacks

## 2017-12-04 ENCOUNTER — Other Ambulatory Visit (HOSPITAL_COMMUNITY): Payer: Self-pay | Admitting: Orthopedic Surgery

## 2017-12-04 ENCOUNTER — Ambulatory Visit (HOSPITAL_COMMUNITY)
Admission: RE | Admit: 2017-12-04 | Discharge: 2017-12-04 | Disposition: A | Discharge status: Home / Self Care | Payer: Medicare Other | Source: Ambulatory Visit | Attending: Neurology | Admitting: Neurology

## 2017-12-04 DIAGNOSIS — M79604 Pain in right leg: Secondary | ICD-10-CM | POA: Diagnosis not present

## 2017-12-04 DIAGNOSIS — I871 Compression of vein: Secondary | ICD-10-CM | POA: Insufficient documentation

## 2017-12-04 DIAGNOSIS — M7989 Other specified soft tissue disorders: Secondary | ICD-10-CM | POA: Diagnosis not present

## 2017-12-04 DIAGNOSIS — M79661 Pain in right lower leg: Secondary | ICD-10-CM | POA: Insufficient documentation

## 2017-12-04 NOTE — Progress Notes (Signed)
Preliminary notes--Right lower extremity venous duplex exam completed. 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   Incidental findings:  1, 1.29x2.99x2.00cm heterogenous cystic structre seen at popliteal fossa extended to prox portion calf medially. 2, 3.92x2.01x2.70cm heterogenous non-vascular oval shaped structure at the superior aspect of calf laterally, unknown etiology.  Result called Dr. Antony Contras office and spoke with Matthew Saras (PA.), patient was instructed to go to see Dr. Noemi Chapel immediately.  Hongying Anddy Wingert (RDMS RVT) 12/04/17 3:01 PM

## 2017-12-10 ENCOUNTER — Encounter: Payer: Self-pay | Admitting: Sports Medicine

## 2017-12-10 ENCOUNTER — Ambulatory Visit: Payer: Medicare Other | Admitting: Sports Medicine

## 2017-12-10 VITALS — BP 138/70 | Ht 72.0 in | Wt 223.0 lb

## 2017-12-10 DIAGNOSIS — I482 Chronic atrial fibrillation, unspecified: Secondary | ICD-10-CM

## 2017-12-10 DIAGNOSIS — M7121 Synovial cyst of popliteal space [Baker], right knee: Secondary | ICD-10-CM | POA: Diagnosis not present

## 2017-12-10 DIAGNOSIS — S86111A Strain of other muscle(s) and tendon(s) of posterior muscle group at lower leg level, right leg, initial encounter: Secondary | ICD-10-CM | POA: Diagnosis not present

## 2017-12-10 DIAGNOSIS — Z7901 Long term (current) use of anticoagulants: Secondary | ICD-10-CM

## 2017-12-10 NOTE — Progress Notes (Signed)
Connor [name] - 84 y.o. male MRN [id number]  Date of birth: 07-24-1933   Chief complaint: Right calf injury  SUBJECTIVE:    History of present illness: Patient is a 84 year old Caucasian male with a past medical history significant for atrial fibrillation on chronic Xarelto who presents today with a chief complaint of right calf injury.  He states that approximately 2 weeks ago, he was landscaping in his yard up an incline when he felt a sudden onset pain in his right posterior calf.  He did not fall to the ground however he did report limping afterwards.  He also reported some ecchymoses and swelling of the knee and calf.  He presented to Dr. Archie Endo office of orthopedics who ordered an ultrasound.  The ultrasound was to rule out a DVT versus ruptured Baker's cyst.  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  They also revealed a 2 x 4 cm oval-shaped structure in the calf laterally as well as a second proximal medial calf structure.  Patient presents today for an ultrasound and possible intervention.  He states that his pain is primarily located in the right lateral calf and centrally.  It has improved approximately 60 to 70% from the initial injury.  He now only has 1 out of 10 pain especially with ambulation.  At rest he has no pain.  He states his swelling has improved significantly also.  He feels he is almost back to baseline.  Denies any numbness or tingling.  Denies any knee pain or posterior knee pain.  Denies any hip pain or ankle pain.  Is never had anything like this before.  He reports medication compliance.  He does report bruising which is normal for him being on blood thinners.   Review of systems:  As stated above   Past medical history was reviewed in epic.  Pertinent positives include atrial fibrillation on Xarelto. Medication list was reviewed by myself. Surgical history was reviewed and unchanged. Social history: He is a former  smoker who quit in 1980.  He is retired. Family history was reviewed.  OBJECTIVE:  Physical exam: Vital signs are reviewed. BP 138/70   Ht 6' (1.829 m)   Wt 223 lb (101.2 kg)   BMI 30.24 kg/m   Gen.: Alert, oriented, appears stated age, in no apparent distress HEENT: Moist oral mucosa, conjunctive are clear Respiratory: Normal respirations, able to speak in full sentences Cardiac: Regular rate, distal pulses 2+ Integumentary: Ecchymoses noted in the right distal gastrocnemius at the gastrosoleus junction Gait: normal without associated limp Musculoskeletal: Inspection of the right knee and calf demonstrate no significant swelling.  There is ecchymosis noted at the gastrosoleus junction and medially.  No obvious deformity.  He does have tenderness to palpation in the mid belly of the lateral gastroc.  He has full range of motion and ankle dorsiflexion and plantarflexion.  Full range of motion in knee flexion extension.  Strength testing 5 out of 5 in knee flexion extension.  5 out of 5 in dorsiflexion plantarflexion.  Negative anterior posterior drawer.  Negative Lockman test.  Negative varus/valgus stress.  Negative McMurray test.  Negative Thompson's test.  Edema +1.  Sensation is intact to light touch L4-S1.  Limited diagnostic ultrasound was performed of the posterior knee and gastrocnemius.  It demonstrated a 2 cm x 2 cm muscle defect in the mid belly of the lateral gastrocnemius.  There was increased neovascularization in this area.  There was also a  2 x 1 cm cystic lesion in the posterior knee consistent with a very small Baker's cyst.  The patient was not tender at this area.  His tenderness was consistent at the defect in the lateral gastrocnemius muscle.  Medial gastroc was unremarkable.  Soleus was unremarkable.    ASSESSMENT & PLAN: 1.  Right lateral gastrocnemius strain 2.  Baker's cyst 3.  Chronic atrial fibrillation 4.  Chronic anticoagulation   Plan: Given the patient's  history, physical exam, and sonographic findings, I do believe he is suffering from a gastrocnemius strain consistent with Dr. Archie Endo opinion.  He does not have any lesions amenable to injection or aspiration today.  His pain has improved significantly since his initial injury.  He reports 60 to 70% improvement with only a 1 out of 10 pain when ambulating.  I did discuss wearing a compression sleeve however the patient given his rapid improvement expresses a desire not to have this.  He is to continue his calf stretches and exercises twice daily for the next 2 weeks.  If he is still having symptoms after 2 weeks, he is to follow-up as already scheduled with Dr. Noemi Chapel.  He is to avoid anti-inflammatories as he is on blood thinners.  He may take Tylenol as needed for discomfort.  He is in understanding of this plan.  Patient seen alongside Dr. Micheline Chapman who agrees with the above plan of care.  Clydene Laming, DO Sports Medicine Fellow [facility]

## 2017-12-22 ENCOUNTER — Ambulatory Visit: Payer: Medicare Other | Admitting: Internal Medicine

## 2018-01-02 ENCOUNTER — Encounter: Payer: Self-pay | Admitting: Internal Medicine

## 2018-01-02 ENCOUNTER — Ambulatory Visit: Payer: Medicare Other | Admitting: Internal Medicine

## 2018-01-02 VITALS — BP 118/76 | HR 72 | Ht 72.0 in | Wt 225.6 lb

## 2018-01-02 DIAGNOSIS — I428 Other cardiomyopathies: Secondary | ICD-10-CM | POA: Diagnosis not present

## 2018-01-02 DIAGNOSIS — I10 Essential (primary) hypertension: Secondary | ICD-10-CM

## 2018-01-02 DIAGNOSIS — I519 Heart disease, unspecified: Secondary | ICD-10-CM | POA: Diagnosis not present

## 2018-01-02 DIAGNOSIS — I480 Paroxysmal atrial fibrillation: Secondary | ICD-10-CM

## 2018-01-02 NOTE — Patient Instructions (Signed)
Medication Instructions:  Your physician has recommended you make the following change in your medication:   1. Stop your potassium supplement  Labwork: You will have labs drawn: BMP in 2 weeks  Testing/Procedures: None ordered.  Follow-Up: Your physician wants you to follow-up in: 6 months with Dr Rayann Heman. You will receive a reminder letter in the mail two months in advance. If you don't receive a letter, please call our office to schedule the follow-up appointment.   Any Other Special Instructions Will Be Listed Below (If Applicable).     If you need a refill on your cardiac medications before your next appointment, please call your pharmacy.

## 2018-01-02 NOTE — Progress Notes (Signed)
PCP: Elayne Snare, MD   Primary EP: Dr Nena Polio III is a 84 y.o. male who presents today for routine electrophysiology followup.  Since last being seen in our clinic, the patient reports doing very well.  He has rare palpitations.  His [name] appears to show only PACs during these episodes. Today, he denies symptoms of  chest pain, shortness of breath,  lower extremity edema, dizziness, presyncope, or syncope.  The patient is otherwise without complaint today.   Past Medical History:  Diagnosis Date  . Abnormal nuclear cardiac imaging test 1/13   Abnormal stress test 05/2011, but cardiac cath 06/2011 showing normal coronary anatomy.  . Anemia   . Atrial flutter [facility])    s/p ablation in March 2012  . BPH (benign prostatic hyperplasia)   . Chronic anticoagulation    on Xarelto for afib  . DVT (deep venous thrombosis) ([place]) 2/12   in setting of R hip surgery, he developed R femoral DVT  . Hyperlipidemia   . Hypertension   . Obstructive sleep apnea    compliant with CPAP.  managed by Dr Elsworth Soho.  . Osteoarthritis   . Paroxysmal atrial fibrillation ([place]) 9/12   a. documented by Dr Barrett Shell on office EKG 9/12. b. maintained on Tikosyn and Xarelto. c. s/p DCCV 10/2012.  Marland Kitchen Peptic ulcer disease   . Prediabetes   . Premature ventricular contraction   . RBBB (right bundle branch block)   . Vitamin D deficiency    Past Surgical History:  Procedure Laterality Date  . ABLATION OF DYSRHYTHMIC FOCUS  08/29/2015  . ATRIAL FIBRILLATION ABLATION  12/05/2016  . ATRIAL FIBRILLATION ABLATION N/A 12/05/2016   Procedure: Atrial Fibrillation Ablation;  Surgeon: [name], MD;  Location: [place] CV LAB;  Service: Cardiovascular;  Laterality: N/A;  . ATRIAL FLUTTER ABLATION  09/2010   by Greggory Brandy  . CARDIAC CATHETERIZATION    . CARDIOVERSION N/A 10/28/2012   Procedure: CARDIOVERSION;  Surgeon: Burnell Blanks, MD;  Location: [place];  Service: Cardiovascular;  Laterality: N/A;  .  ELECTROPHYSIOLOGIC STUDY N/A 08/29/2015   Procedure: Atrial Fibrillation Ablation;  Surgeon: [name], MD;  Location: [place] CV LAB;  Service: Cardiovascular;  Laterality: N/A;  . JOINT REPLACEMENT    . LEFT HEART CATHETERIZATION WITH CORONARY ANGIOGRAM N/A 06/07/2011   Procedure: LEFT HEART CATHETERIZATION WITH CORONARY ANGIOGRAM;  Surgeon: Peter M Martinique, MD;  Location: [facility] CATH LAB;  Service: Cardiovascular;  Laterality: N/A;  . TOTAL HIP ARTHROPLASTY  06/04/10   RIGHT HIP  . TOTAL KNEE ARTHROPLASTY    . TRANSURETHRAL RESECTION OF PROSTATE      ROS- all systems are reviewed and negatives except as per HPI above  Current Outpatient Medications  Medication Sig Dispense Refill  . acetaminophen (TYLENOL) 500 MG tablet Take 1,000 mg by mouth 2 (two) times daily as needed for moderate pain.    Marland Kitchen atorvastatin (LIPITOR) 10 MG tablet TAKE 1 TABLET BY MOUTH EVERY DAY 90 tablet 1  . Carboxymethylcellulose Sodium (THERATEARS OP) Apply 1 drop to eye daily as needed (dry eyes).    . Cholecalciferol (VITAMIN D3) 5000 UNITS TABS Take 5,000 Units by mouth See admin instructions. Take 5000 units by mouth every 4 days    . clindamycin (CLEOCIN T) 1 % external solution Apply 1 application topically daily as needed (for bumps in scalp). Apply to scalp  2  . Cranberry 1000 MG CAPS Take 2 capsules at bedtime by mouth.    Marland Kitchen  diclofenac sodium (VOLTAREN) 1 % GEL Apply 2 g topically 2 (two) times daily. 100 g 1  . docusate sodium (COLACE) 100 MG capsule Take 100 mg daily as needed by mouth (constipation).     . metoprolol tartrate (LOPRESSOR) 25 MG tablet TAKE 1 TABLET BY MOUTH EVERY 6 HOURS AS NEEDED FOR FAST HEART RATES 30 tablet 6  . Multiple Vitamins-Minerals (ICAPS PO) Take 1 tablet by mouth 2 (two) times daily.     . Multiple Vitamins-Minerals (MULTIVITAMIN) LIQD Take 5 mLs by mouth 2 (two) times a week.    . Omega-3 Fatty Acids (OMEGA 3 PO) Take 3 capsules by mouth every evening.     Marland Kitchen omeprazole  (PRILOSEC) 20 MG capsule Take 20 mg by mouth daily.     . potassium chloride SA (K-DUR,KLOR-CON) 20 MEQ tablet Take 1 tablet (20 mEq total) by mouth daily. 90 tablet 3  . tamsulosin (FLOMAX) 0.4 MG CAPS capsule TAKE ONE CAPSULE BY MOUTH EVERY DAY AFTER SAME MEAL 90 capsule 1  . vitamin B-12 (CYANOCOBALAMIN) 500 MCG tablet Take 500 mcg by mouth at bedtime.     Alveda Reasons 20 MG TABS tablet TAKE 1 TABLET BY MOUTH EVERY DAY 90 tablet 3  . metoprolol succinate (TOPROL-XL) 50 MG 24 hr tablet Take 1 tablet (50 mg total) by mouth daily. Take with or immediately following a meal. 90 tablet 3   No current facility-administered medications for this visit.     Physical Exam: Vitals:   01/02/18 0950  BP: 118/76  Pulse: 72  SpO2: 94%  Weight: 225 lb 9.6 oz (102.3 kg)  Height: 6' (1.829 m)    GEN- The patient is well appearing, alert and oriented x 3 today.   Head- normocephalic, atraumatic Eyes-  Sclera clear, conjunctiva pink Ears- hearing intact Oropharynx- clear Lungs- Clear to ausculation bilaterally, normal work of breathing Heart- Regular rate and rhythm, no murmurs, rubs or gallops, PMI not laterally displaced GI- soft, NT, ND, + BS Extremities- no clubbing, cyanosis, or edema  Wt Readings from Last 3 Encounters:  01/02/18 225 lb 9.6 oz (102.3 kg)  12/10/17 223 lb (101.2 kg)  11/20/17 227 lb 9.6 oz (103.2 kg)    EKG tracing ordered today is personally reviewed and shows sinus rhythm, RBBB  Assessment and Plan:  1. Paroxysmal atrial fibrillation Doing well post ablatino off Sherron Flemings personally reviewed today and reveals PACs chads2vasc score is 4.  Continue xarelto continue toprol (palpitations increased with lower dose)  2. Nonischemic CM EF 45-50% by echo 06/11/17  3. HTN Stable No change required today  4. OSA Compliance with CPAP encouraged  5. Previously low K Stop KDur Return for BMET in 2 weeks  Return in 6 months  [name] MD,  [facility] 01/02/2018 10:27 AM

## 2018-01-09 ENCOUNTER — Other Ambulatory Visit: Payer: Self-pay | Admitting: Endocrinology

## 2018-01-09 NOTE — Telephone Encounter (Signed)
Okay to refill? 

## 2018-01-09 NOTE — Telephone Encounter (Signed)
Is this okay to refill? 

## 2018-01-16 ENCOUNTER — Other Ambulatory Visit: Payer: Medicare Other | Admitting: *Deleted

## 2018-01-16 DIAGNOSIS — I519 Heart disease, unspecified: Secondary | ICD-10-CM

## 2018-01-16 DIAGNOSIS — I10 Essential (primary) hypertension: Secondary | ICD-10-CM

## 2018-01-16 DIAGNOSIS — I428 Other cardiomyopathies: Secondary | ICD-10-CM

## 2018-01-16 DIAGNOSIS — I480 Paroxysmal atrial fibrillation: Secondary | ICD-10-CM

## 2018-01-16 LAB — BASIC METABOLIC PANEL
BUN/Creatinine Ratio: 23 (ref 10–24)
BUN: 18 mg/dL (ref 8–27)
CALCIUM: 8.7 mg/dL (ref 8.6–10.2)
CO2: 24 mmol/L (ref 20–29)
CREATININE: 0.77 mg/dL (ref 0.76–1.27)
Chloride: 105 mmol/L (ref 96–106)
GFR, EST AFRICAN AMERICAN: 98 mL/min/{1.73_m2} (ref >59)
GFR, EST NON AFRICAN AMERICAN: 85 mL/min/{1.73_m2} (ref >59)
Glucose: 97 mg/dL (ref 65–99)
Potassium: 4 mmol/L (ref 3.5–5.2)
Sodium: 143 mmol/L (ref 134–144)

## 2018-03-13 ENCOUNTER — Telehealth: Payer: Self-pay | Admitting: Endocrinology

## 2018-03-13 ENCOUNTER — Other Ambulatory Visit: Payer: Self-pay | Admitting: Endocrinology

## 2018-03-13 NOTE — Telephone Encounter (Signed)
Denies fever, pain on left side that started last week. States last 3-4 days has not bothered him that much. Still having some tenderness in side. States he is eating soft food and liquids. Denies trouble with BMs

## 2018-03-13 NOTE — Telephone Encounter (Signed)
Please advise on note below.   

## 2018-03-13 NOTE — Telephone Encounter (Signed)
Spoke with pt to inform.  

## 2018-03-13 NOTE — Telephone Encounter (Signed)
No need for antibiotic unless he is having significant pain

## 2018-03-13 NOTE — Telephone Encounter (Signed)
Patient called re: Patient wants Dr. Dwyane Dee to know that he is having a flare up of Diverticulitis. Please call patient if there is any treatment Dr. Dwyane Dee might want to do, if any. Patient states the Diverticulitis is not too bad. Patient ph# [phone]

## 2018-03-13 NOTE — Telephone Encounter (Signed)
Please find out exactly what symptoms he is having and for how long, any fever?

## 2018-03-13 NOTE — Telephone Encounter (Signed)
Please advise 

## 2018-03-24 ENCOUNTER — Ambulatory Visit: Payer: Medicare Other | Admitting: Pulmonary Disease

## 2018-04-13 ENCOUNTER — Ambulatory Visit: Payer: Medicare Other | Admitting: Pulmonary Disease

## 2018-04-13 ENCOUNTER — Encounter: Payer: Self-pay | Admitting: Pulmonary Disease

## 2018-04-13 DIAGNOSIS — G4733 Obstructive sleep apnea (adult) (pediatric): Secondary | ICD-10-CM

## 2018-04-13 DIAGNOSIS — I4819 Other persistent atrial fibrillation: Secondary | ICD-10-CM | POA: Diagnosis not present

## 2018-04-13 NOTE — Assessment & Plan Note (Signed)
Appears to be in sinus rhythm now continues on anticoagulation

## 2018-04-13 NOTE — Progress Notes (Signed)
   Subjective:    Patient ID: Connor [name], male    DOB: 04/17/1934, 84 y.o.   MRN: [id number]  HPI  84 yo man for FU of obstructive sleep apnea PMH-  recurrent atrial fib.  Status post ablation June 2014 and again February 2017, August 2018.  DVT after right hip placement February 2012 Nasal injury as a child with chronic nasal blockage.   Chief Complaint  Patient presents with  . Follow-up    69yr f/u for OSA. Was recently diagnosed with RA.    Annual follow-up, he denies any problems with CPAP mask or pressure.  Feels rested on waking up.  Unfortunately he has developed increased nocturia and urinary urgency due to prostate enlargement and in spite of using Proscar and Flomax his symptoms are not controlled.  He is due to see his urologist soon. He denies palpitations. CPAP report was reviewed and obtained and shows decreased compliance sporadically about 4 hours every night with no residual events and no leak  He inquired about alternatives to CPAP therapy and we discussed this  Significant tests/ events  PSG 05/2013 Moderate OSA with AHI 21/h 07/2013 CPAP of 11 centimeters with a large full face mask    Review of Systems Patient denies significant dyspnea,cough, hemoptysis,  chest pain, palpitations, pedal edema, orthopnea, paroxysmal nocturnal dyspnea, lightheadedness, nausea, vomiting, abdominal or  leg pains      Objective:   Physical Exam  Gen. Pleasant, well-nourished, in no distress ENT - no thrush, no post nasal drip Neck: No JVD, no thyromegaly, no carotid bruits Lungs: no use of accessory muscles, no dullness to percussion, clear without rales or rhonchi  Cardiovascular: Rhythm regular, heart sounds  normal, no murmurs or gallops, no peripheral edema Musculoskeletal: No deformities, no cyanosis or clubbing        Assessment & Plan:

## 2018-04-13 NOTE — Patient Instructions (Signed)
CPAP is set at 11 cm and seems to be working well. CPAP supplies will be renewed for a year

## 2018-04-13 NOTE — Assessment & Plan Note (Signed)
CPAP seems to be working well on current settings.  What is decreasing his compliance currently is Rx problems with his prostate and urinary urgency and nocturia hopefully his urologist can help him out with that and he can get back on track.  compliance with goal of at least 4-6 hrs every night is the expectation. Advised against medications with sedative side effects Cautioned against driving when sleepy - understanding that sleepiness will vary on a day to day basis

## 2018-05-10 ENCOUNTER — Other Ambulatory Visit: Payer: Self-pay | Admitting: Endocrinology

## 2018-05-22 ENCOUNTER — Other Ambulatory Visit: Payer: Medicare Other

## 2018-05-25 ENCOUNTER — Ambulatory Visit: Payer: Medicare Other | Admitting: Endocrinology

## 2018-06-08 ENCOUNTER — Telehealth: Payer: Self-pay | Admitting: Internal Medicine

## 2018-06-08 NOTE — Telephone Encounter (Signed)
New Message      [facility] Pre-operative Risk Assessment    Request for surgical clearance:  1. What type of surgery is being performed? Turp    2. When is this surgery scheduled? TBD    3. What type of clearance is required (medical clearance vs. Pharmacy clearance to hold med vs. Both)? Both  4. Are there any medications that need to be held prior to surgery and how long? Xarelto 72 hours prior to procedure   5. Practice name and name of physician performing surgery? [facility] Dr. Preston Fleeting   6. What is your office phone number [phone]   7.   What is your office fax number [phone]  8.   Anesthesia type (None, local, MAC, general) ? General    Avaletta L Williams 06/08/2018, 12:43 PM  _________________________________________________________________   (provider comments below)

## 2018-06-11 NOTE — Telephone Encounter (Signed)
Patient with diagnosis of afib on Xarelto for anticoagulation.    Procedure: TURP Date of procedure: TBD  CHADS2-VASc score of  4 (CHF, HTN, AGE, DM2, stroke/tia x 2, CAD, AGE, male)  Per office protocol, patient can hold Xarelto for 3 days prior to procedure.   Patient does have a history of provoked DVT post hip surgery. Xarelto should be resumed as soon as safely possible.

## 2018-06-11 NOTE — Telephone Encounter (Signed)
Pharm please address Xarelto 

## 2018-06-12 ENCOUNTER — Other Ambulatory Visit: Payer: Self-pay | Admitting: Urology

## 2018-06-12 NOTE — Telephone Encounter (Signed)
   Primary Cardiologist: No primary care provider on file.  Chart reviewed as part of pre-operative protocol coverage. Patient was contacted 06/12/2018 in reference to pre-operative risk assessment for pending surgery as outlined below.  Connor [name] was last seen on 01/02/18 by Dr. Rayann Heman.  Since that day, Connor [name] has done well. He denies any new symptoms and changes in his medical history since that visit.   Per our pharmacy staff: Patient with diagnosis of afib on Xarelto for anticoagulation.    Procedure: TURP Date of procedure: TBD  CHADS2-VASc score of  4 (CHF, HTN, AGE, DM2, stroke/tia x 2, CAD, AGE, male)  Per office protocol, patient can hold Xarelto for 3 days prior to procedure.   Patient does have a history of provoked DVT post hip surgery. Xarelto should be resumed as soon as safely possible.   Therefore, based on ACC/AHA guidelines, the patient would be at acceptable risk for the planned procedure without further cardiovascular testing.   I will route this recommendation to the requesting party via Epic fax function and remove from pre-op pool.  Please call with questions.  Tami Lin Duke, PA 06/12/2018, 9:34 AM

## 2018-06-18 ENCOUNTER — Ambulatory Visit: Payer: Medicare Other | Admitting: Family Medicine

## 2018-06-18 MED ORDER — METOPROLOL TARTRATE 25 MG PO TABS: ORAL_TABLET | ORAL | 6 refills | Status: DC

## 2018-06-23 ENCOUNTER — Ambulatory Visit (HOSPITAL_COMMUNITY)
Admission: RE | Admit: 2018-06-23 | Discharge: 2018-06-23 | Disposition: A | Discharge status: Home / Self Care | Payer: Medicare Other | Source: Ambulatory Visit | Attending: Nurse Practitioner | Admitting: Nurse Practitioner

## 2018-06-23 ENCOUNTER — Encounter (HOSPITAL_COMMUNITY): Payer: Self-pay | Admitting: Nurse Practitioner

## 2018-06-23 ENCOUNTER — Other Ambulatory Visit: Payer: Medicare Other

## 2018-06-23 VITALS — BP 104/66 | HR 94 | Ht 72.0 in | Wt 224.0 lb

## 2018-06-23 DIAGNOSIS — E785 Hyperlipidemia, unspecified: Secondary | ICD-10-CM | POA: Insufficient documentation

## 2018-06-23 DIAGNOSIS — D649 Anemia, unspecified: Secondary | ICD-10-CM | POA: Diagnosis not present

## 2018-06-23 DIAGNOSIS — R000 Tachycardia, unspecified: Secondary | ICD-10-CM | POA: Diagnosis not present

## 2018-06-23 DIAGNOSIS — Z87891 Personal history of nicotine dependence: Secondary | ICD-10-CM | POA: Diagnosis not present

## 2018-06-23 DIAGNOSIS — Z79899 Other long term (current) drug therapy: Secondary | ICD-10-CM | POA: Insufficient documentation

## 2018-06-23 DIAGNOSIS — G4733 Obstructive sleep apnea (adult) (pediatric): Secondary | ICD-10-CM | POA: Diagnosis not present

## 2018-06-23 DIAGNOSIS — N400 Enlarged prostate without lower urinary tract symptoms: Secondary | ICD-10-CM | POA: Insufficient documentation

## 2018-06-23 DIAGNOSIS — Z8711 Personal history of peptic ulcer disease: Secondary | ICD-10-CM | POA: Insufficient documentation

## 2018-06-23 DIAGNOSIS — E559 Vitamin D deficiency, unspecified: Secondary | ICD-10-CM | POA: Diagnosis not present

## 2018-06-23 DIAGNOSIS — I480 Paroxysmal atrial fibrillation: Secondary | ICD-10-CM | POA: Diagnosis not present

## 2018-06-23 DIAGNOSIS — R7303 Prediabetes: Secondary | ICD-10-CM | POA: Insufficient documentation

## 2018-06-23 DIAGNOSIS — I10 Essential (primary) hypertension: Secondary | ICD-10-CM | POA: Diagnosis not present

## 2018-06-23 DIAGNOSIS — Z86718 Personal history of other venous thrombosis and embolism: Secondary | ICD-10-CM | POA: Insufficient documentation

## 2018-06-23 DIAGNOSIS — I4510 Unspecified right bundle-branch block: Secondary | ICD-10-CM | POA: Diagnosis not present

## 2018-06-23 DIAGNOSIS — Z880 Allergy status to penicillin: Secondary | ICD-10-CM | POA: Diagnosis not present

## 2018-06-23 DIAGNOSIS — Z823 Family history of stroke: Secondary | ICD-10-CM | POA: Insufficient documentation

## 2018-06-23 DIAGNOSIS — Z8249 Family history of ischemic heart disease and other diseases of the circulatory system: Secondary | ICD-10-CM | POA: Diagnosis not present

## 2018-06-23 DIAGNOSIS — Z7901 Long term (current) use of anticoagulants: Secondary | ICD-10-CM | POA: Diagnosis not present

## 2018-06-23 NOTE — Progress Notes (Signed)
Patient ID: JASEAN AMBROSIA III, male   DOB: [birth year], 85 y.o.   MRN: [id number]     Primary Care Physician: Elayne Snare, MD Referring Physician: Dr. [name] HERNANDEZ LOSASSO III is a 85 y.o. male with a h/o PAF that recently underwent repeat afib ablation 08/29/15 for f/u in the afib clinic. He reports just one epiosode of afib a few days after ablation. Otherwise staying in SR, very little afib. Continues on tikosyn/xarelto without issues.He did have a torn hamstring with bleeding into left leg which caused hypotension and near syncope and was admitted to [facility]. Xarelto was held x 4 days and has resolved.  He asked to be seen in  the afib clinic, 3/12, for wakening with afib this am. He took his am meds and now is in SR. He said this has happened 3-4 early mornings over the last month.He is using his cpap and wore the entire night last night and still woke up with afib. He is taking his tikosyn/xarelto as prescribed. Small dose of BB at bedtime was added.  Pt asked to be in afib clinic, 6/14, for increased afib burden over several weeks, occurs several times a week . He has a Investment banker, operational and brings in afib strips for for review. Will last on a average of 2 hours. He continues on max dose of tikosyn and is limited on BB dose due to hypotension.  F/u afib clinic, 9/518. He underwent repeat ablation 8/2, and has done well. Has a Kardia device and it has shown a few episodes of nonsustained afib but pt unaware. He is in SR today and feels his afib burden is low.No swallowing or groin issues.  F/u in afib clinic 10/27/17. He went back into afib while on vacation at the beach last week. His metoprolol was increased back to usual dose(it was reduced on visit with Dr. Rayann Heman, 5/22). Today, he is back in SR. He feels well.  F/u in afib clinic 06/23/18, at pt's request for noting increased HR's in the last 2-3 months. He has now been dx with RA but has not been able to take his Humara for recent UTI's and is  scheduled for TURP 3/2. This has caused significant increase in his pain, mostly in his hands and shoulders.He hopes to be able to restart Humara after the surgery. He also has been on prednisone for this period of time for control of the pain. He continues on his daily metoprolol succinate and will take an occasional metoprolol tartrate. Despite this, his HR's appear to be running pretty consistent in the 90's.Prior to a few month's ago, he would run in the 50/60 's bpm. He just wanted to  have this checked with his pending surgery. His EKG does show SR today.he is not aware of the higher HR's notices on his watch.  Today, he denies symptoms of palpitations, chest pain, shortness of breath, orthopnea, PND, lower extremity edema, dizziness, presyncope, syncope, or neurologic sequela. The patient is tolerating medications without difficulties and is otherwise without complaint today.   Past Medical History:  Diagnosis Date  . Abnormal nuclear cardiac imaging test 1/13   Abnormal stress test 05/2011, but cardiac cath 06/2011 showing normal coronary anatomy.  . Anemia   . Atrial flutter [facility])    s/p ablation in March 2012  . BPH (benign prostatic hyperplasia)   . Chronic anticoagulation    on Xarelto for afib  . DVT (deep venous thrombosis) ([place]) 2/12   in setting  of R hip surgery, he developed R femoral DVT  . Hyperlipidemia   . Hypertension   . Obstructive sleep apnea    compliant with CPAP.  managed by Dr Elsworth Soho.  . Osteoarthritis   . Paroxysmal atrial fibrillation ([place]) 9/12   a. documented by Dr Barrett Shell on office EKG 9/12. b. maintained on Tikosyn and Xarelto. c. s/p DCCV 10/2012.  Marland Kitchen Peptic ulcer disease   . Prediabetes   . Premature ventricular contraction   . RBBB (right bundle branch block)   . Vitamin D deficiency    Past Surgical History:  Procedure Laterality Date  . ABLATION OF DYSRHYTHMIC FOCUS  08/29/2015  . ATRIAL FIBRILLATION ABLATION  12/05/2016  . ATRIAL FIBRILLATION ABLATION  N/A 12/05/2016   Procedure: Atrial Fibrillation Ablation;  Surgeon: [name], MD;  Location: [place] CV LAB;  Service: Cardiovascular;  Laterality: N/A;  . ATRIAL FLUTTER ABLATION  09/2010   by Greggory Brandy  . CARDIAC CATHETERIZATION    . CARDIOVERSION N/A 10/28/2012   Procedure: CARDIOVERSION;  Surgeon: Burnell Blanks, MD;  Location: [place];  Service: Cardiovascular;  Laterality: N/A;  . ELECTROPHYSIOLOGIC STUDY N/A 08/29/2015   Procedure: Atrial Fibrillation Ablation;  Surgeon: [name], MD;  Location: [place] CV LAB;  Service: Cardiovascular;  Laterality: N/A;  . JOINT REPLACEMENT    . LEFT HEART CATHETERIZATION WITH CORONARY ANGIOGRAM N/A 06/07/2011   Procedure: LEFT HEART CATHETERIZATION WITH CORONARY ANGIOGRAM;  Surgeon: Peter M Martinique, MD;  Location: [facility] CATH LAB;  Service: Cardiovascular;  Laterality: N/A;  . TOTAL HIP ARTHROPLASTY  06/04/10   RIGHT HIP  . TOTAL KNEE ARTHROPLASTY    . TRANSURETHRAL RESECTION OF PROSTATE      Current Outpatient Medications  Medication Sig Dispense Refill  . acetaminophen (TYLENOL) 500 MG tablet Take 1,000 mg by mouth 2 (two) times daily as needed for moderate pain.    Marland Kitchen atorvastatin (LIPITOR) 10 MG tablet TAKE 1 TABLET BY MOUTH EVERY DAY 90 tablet 1  . Carboxymethylcellulose Sodium (THERATEARS OP) Apply 1 drop to eye daily as needed (dry eyes).    . Cholecalciferol (VITAMIN D3) 5000 UNITS TABS Take 5,000 Units by mouth See admin instructions. Take 5000 units by mouth every 4 days    . clindamycin (CLEOCIN T) 1 % external solution Apply 1 application topically daily as needed (for bumps in scalp). Apply to scalp  2  . Cranberry 1000 MG CAPS Take 2 capsules at bedtime by mouth.    . diclofenac sodium (VOLTAREN) 1 % GEL APPLY 2 G TOPICALLY 2 (TWO) TIMES DAILY. 100 g 1  . docusate sodium (COLACE) 100 MG capsule Take 100 mg daily as needed by mouth (constipation).     . finasteride (PROSCAR) 5 MG tablet Take 5 mg by mouth daily.  3  . HUMIRA PEN  40 MG/0.4ML PNKT Inject ONE pen SUBCUTANEOUSLY ONCE every TWO WEEKS  6  . metoprolol succinate (TOPROL-XL) 50 MG 24 hr tablet Take 25 mg in the morning and 50 mg in the evening    . metoprolol tartrate (LOPRESSOR) 25 MG tablet TAKE 1 TABLET BY MOUTH EVERY 6 HOURS AS NEEDED FOR FAST HEART RATES 30 tablet 6  . Multiple Vitamins-Minerals (ICAPS PO) Take 1 tablet by mouth 2 (two) times daily.     . Multiple Vitamins-Minerals (MULTIVITAMIN) LIQD Take 5 mLs by mouth 2 (two) times a week.    . Omega-3 Fatty Acids (OMEGA 3 PO) Take 3 capsules by mouth every evening.     Marland Kitchen  omeprazole (PRILOSEC) 20 MG capsule Take 20 mg by mouth daily.     . predniSONE (STERAPRED UNI-PAK 48 TAB) 5 MG (48) TBPK tablet TAKE AS DIRECTED PER INSTRUCTIONS INSIDE PACKAGING    . tamsulosin (FLOMAX) 0.4 MG CAPS capsule TAKE ONE CAPSULE BY MOUTH EVERY DAY AFTER SAME MEAL 90 capsule 1  . vitamin B-12 (CYANOCOBALAMIN) 500 MCG tablet Take 500 mcg by mouth at bedtime.     Alveda Reasons 20 MG TABS tablet TAKE 1 TABLET BY MOUTH EVERY DAY 90 tablet 3   No current facility-administered medications for this encounter.     Allergies  Allergen Reactions  . Penicillins Itching, Swelling and Other (See Comments)    Has patient had a PCN reaction causing immediate rash, facial/tongue/throat swelling, SOB or lightheadedness with hypotension: Yes Has patient had a PCN reaction causing severe rash involving mucus membranes or skin necrosis: No Has patient had a PCN reaction that required hospitalization No Has patient had a PCN reaction occurring within the last 10 years: No If all of the above answers are "NO", then may proceed with Cephalosporin use.     Social History   Socioeconomic History  . Marital status: Married    Spouse name: Not on file  . Number of children: Not on file  . Years of education: Not on file  . Highest education level: Not on file  Occupational History  . Not on file  Social Needs  . Financial resource strain:  Not on file  . Food insecurity:    Worry: Not on file    Inability: Not on file  . Transportation needs:    Medical: Not on file    Non-medical: Not on file  Tobacco Use  . Smoking status: Former Smoker    Packs/day: 1.00    Years: 30.00    Pack years: 30.00    Types: Cigarettes    Last attempt to quit: 05/06/1978    Years since quitting: 40.1  . Smokeless tobacco: Never Used  Substance and Sexual Activity  . Alcohol use: Yes    Alcohol/week: 5.0 standard drinks    Types: 5 Cans of beer per week    Comment: he has just about quit  . Drug use: No  . Sexual activity: Not on file  Lifestyle  . Physical activity:    Days per week: Not on file    Minutes per session: Not on file  . Stress: Not on file  Relationships  . Social connections:    Talks on phone: Not on file    Gets together: Not on file    Attends religious service: Not on file    Active member of club or organization: Not on file    Attends meetings of clubs or organizations: Not on file    Relationship status: Not on file  . Intimate partner violence:    Fear of current or ex partner: Not on file    Emotionally abused: Not on file    Physically abused: Not on file    Forced sexual activity: Not on file  Other Topics Concern  . Not on file  Social History Narrative   he patient lives in [place] with his spouse.  Retired.    Family History  Problem Relation Age of Onset  . Heart attack Father   . Lung disease Mother   . Arrhythmia Sister   . Atrial fibrillation Brother   . Diabetes Neg Hx     ROS- All systems are  reviewed and negative except as per the HPI above  Physical Exam: Vitals:   06/23/18 1529  BP: 104/66  Pulse: 94  Weight: 101.6 kg  Height: 6' (1.829 m)    GEN- The patient is well appearing, alert and oriented x 3 today.   Head- normocephalic, atraumatic Eyes-  Sclera clear, conjunctiva pink Ears- hearing intact Oropharynx- clear Neck- supple, no JVP Lymph- no cervical  lymphadenopathy Lungs- Clear to ausculation bilaterally, normal work of breathing Heart- Regular rate and rhythm, no murmurs, rubs or gallops, PMI not laterally displaced GI- soft, NT, ND, + BS Extremities- no clubbing, cyanosis, or edema MS- no significant deformity or atrophy Skin- no rash or lesion Psych- euthymic mood, full affect Neuro- strength and sensation are intact  EKG- NSR at 94 bpm, pr int 148 ms, qrs int 142 ms, qtc 480 ms  Assessment and Plan: 1. Paroxysmal atrial fibrillation s/p ablation Pt has been running faster HR's over the last 2-3 months, but he  is in SR, confirmed with Dr. Rayann Heman I think HR may be driven by pain from his RA, off Humara with his pending TURP and issues with  UTI"s No change in current rate control, make sure to take BB prior to surgery to prevent tachycardia Pt has previoulsy  received instructions when to stop anticoagulation  afib clinic as needed   Butch Penny C. Henery Betzold, [facility] [address] [place], [place] [address] [phone]

## 2018-06-26 ENCOUNTER — Ambulatory Visit: Payer: Medicare Other | Admitting: Endocrinology

## 2018-06-28 NOTE — Patient Instructions (Addendum)
It was very nice to meet you today, I hope that all goes well with your upcoming operation!  I know you will be relieved to have this over with  Once you are through your operation, you might want to have the Shingrix shingles vaccine given at your drug store  This is a 2 shot series with 2 doses given 2-6 months apart  Let's plan to do a physical and routine labs here in July.   Let me know if you need anything in the meantime

## 2018-06-28 NOTE — Progress Notes (Signed)
[place] [facility] [address], [place], [place] [address] [phone]  Date:  07/02/2018   Name:  Connor [name]   DOB:  01-03-1934   MRN:  [id number]  PCP:  Connor Mclean, MD    Chief Complaint: New Patient (Initial Visit) Connor [name] care)   History of Present Illness:  Connor [name] is a 85 y.o. very pleasant male patient who presents with the following:  Connor [name] here as a new patient to establish care History of atrial fib, status post ablation in 2012, and then repeat in 2017, 12/2016.  His most recent ablation has kept him in sinus rhythm since it was performed. He is on Xarelto, no longer has to take Tikosyn Also history of hypertension, sleep apnea, hyperlipidemia, BPH, seronegative rheumatoid arthritis and osteoarthritis, DVT following hip replacement 2012  It looks like Dr. Diona [name] plans to do a TURP procedure soon; per notes, he is having very bothersome nocturia which is interfering with his ability to sleep He has not been able to use his CPAP for the last couple of months due to his urinary issues  He also has had to hold his humira due to urinary issues and UTI The operation will be next week He is currently wearing a catheter  He is a patient of pulmonology, Dr. Elsworth [name].  Sees him annually in general  Cardiology, Dr. Rayann [name] Rheumatology, Dr. Lenna [name].   He sees Dr. Ubaldo [name] annually for a skin check Connor [name] is his urologist Dr. Noemi [name] is his ortho   Flu shot is up-to-date, he has had both pneumonia vaccines.  Zostavax was 10 years ago, mentioned Shingrix.  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 and have a place there   Patient Active Problem List   Diagnosis Date Noted  . Gastrocnemius strain, right, initial encounter 12/10/2017  . Baker's cyst of knee, right 12/10/2017  . Obesity 03/24/2017  . Paroxysmal atrial fibrillation ([place]) 12/05/2016  . Near syncope 04/20/2016  . Leg hematoma, left, initial encounter 04/20/2016  . Bleeding   . Ecchymosis   . Left hamstring muscle strain   . Muscle tear   . RBBB 08/30/2015  . A-fib ([place]) 08/29/2015  . Chronic systolic dysfunction of left ventricle 07/11/2014  . OSA (obstructive sleep apnea) 04/07/2013  . Multinodular goiter 01/20/2013  . Cervical spondylosis without myelopathy 01/18/2013  . Atrial fibrillation with RVR ([place]) 10/27/2012  . PAF (paroxysmal atrial fibrillation) ([place]) 05/28/2012  . Left ventricular dysfunction 12/17/2011  . Low back pain 09/11/2011  . Fatigue 05/22/2011  . PVC (premature ventricular contraction) 01/18/2011  . Persistent atrial fibrillation 01/12/2011  . Hyperlipidemia 08/02/2010  . Hypertension 08/02/2010  . Osteoarthritis 08/02/2010  . BPH (benign prostatic hyperplasia) 08/02/2010  . GERD (gastroesophageal reflux disease) 08/02/2010  . h/o Atrial flutter ([place]) s/p ablation 2012     Past Medical History:  Diagnosis Date  . Benign localized prostatic hyperplasia with lower urinary tract symptoms (LUTS)    urologist-- dr Connor [name]  .  Chronic anticoagulation    Xarelto for afib  . Chronic constipation   . Chronic cystitis   . Gross hematuria   . History of DVT of lower extremity 06/08/2010   right femoral dvt dx post op total hip 05-08-2010  . History of gastric ulcer    remote  . History of kidney stones   . Hyperlipidemia   . Hypertension   . NICM (nonischemic cardiomyopathy) ([place])    per echo 06-11-2017,  ef 45-50% with diffuse hypokinesis,  G1DD  . OSA on CPAP    compliant with CPAP.  managed by Dr  Connor [name].  . Osteoarthritis   . Other urethral stricture, male, meatal    chronic;  pt self dilates  . Paroxysmal atrial fibrillation [facility]) cardiologist-- dr Connor [name]   first dx 2009/   a. documented by Dr Connor [name] on office EKG 9/12. b. maintained on Tikosyn and Xarelto. c. s/p DCCV 's.  d.  x3  EP w/ ablation atrial fib last one 12-05-2016  . Prediabetes   . Premature ventricular contraction   . PVC's (premature ventricular contractions)   . RA (rheumatoid arthritis) [facility])    rheumatologist--- dr Connor [name],  treated with Humira  . RBBB (right bundle branch block)   . Rheumatoid arthritis ([place])   . Vitamin D deficiency   . Wears hearing aid in both ears     Past Surgical History:  Procedure Laterality Date  . ABLATION OF DYSRHYTHMIC FOCUS  08/29/2015  . ATRIAL FIBRILLATION ABLATION  12/05/2016  . ATRIAL FIBRILLATION ABLATION N/A 12/05/2016   Procedure: Atrial Fibrillation Ablation;  Surgeon: Connor Grayer, MD;  Location: [place] CV LAB;  Service: Cardiovascular;  Laterality: N/A;  . ATRIAL FLUTTER ABLATION  09/2010   by Connor [name]  . BUNIONECTOMY Right 1990s  . CARDIOVERSION N/A 10/28/2012   Procedure: CARDIOVERSION;  Surgeon: Connor Blanks, MD;  Location: [place];  Service: Cardiovascular;  Laterality: N/A;  . CATARACT EXTRACTION W/ INTRAOCULAR LENS  IMPLANT, BILATERAL  2019  . ELECTROPHYSIOLOGIC STUDY N/A 08/29/2015   Procedure: Atrial Fibrillation Ablation;  Surgeon: Connor Grayer, MD;  Location: [place] CV LAB;  Service: Cardiovascular;  Laterality: N/A;  . KNEE SURGERY Bilateral x3   1960s & 1970s   open  . LEFT HEART CATHETERIZATION WITH CORONARY ANGIOGRAM N/A 06/07/2011   Procedure: LEFT HEART CATHETERIZATION WITH CORONARY ANGIOGRAM;  Surgeon: Connor M Martinique, MD;  Location: [facility] CATH LAB;  Service: Cardiovascular;  Laterality: N/A;    Normal coronary arteries,  low normal LVF  . TONSILLECTOMY AND ADENOIDECTOMY  age 62  . TOTAL HIP ARTHROPLASTY Right 06/04/10    dr Connor [name]  @[facility]   .  TOTAL KNEE ARTHROPLASTY Bilateral right 1995;  left 1997  . TRANSURETHRAL RESECTION OF PROSTATE  06-29-2002   dr humphries  @[facility]     Social History   Tobacco Use  . Smoking status: Former Smoker    Packs/day: 1.00    Years: 30.00    Pack years: 30.00    Types: Cigarettes    Last attempt to quit: 05/06/1978    Years since quitting: 40.1  . Smokeless tobacco: Never Used  Substance Use Topics  . Alcohol use: Yes    Comment: occasionally wine and beer  . Drug use: Never    Family History  Problem Relation Age of Onset  . Heart attack Father   . Lung disease Mother   . Arrhythmia Sister   . Atrial fibrillation Brother   . Diabetes Neg Hx  Allergies  Allergen Reactions  . Penicillins Itching, Swelling and Other (See Comments)    Has patient had a PCN reaction causing immediate rash, facial/tongue/throat swelling, SOB or lightheadedness with hypotension: Yes Has patient had a PCN reaction causing severe rash involving mucus membranes or skin necrosis: No Has patient had a PCN reaction that required hospitalization No Has patient had a PCN reaction occurring within the last 10 years: No If all of the above answers are "NO", then may proceed with Cephalosporin use.     Medication list has been reviewed and updated.  Current Outpatient Medications on File Prior to Visit  Medication Sig Dispense Refill  . acetaminophen (TYLENOL) 500 MG tablet Take 1,000 mg by mouth 2 (two) times daily as needed for moderate pain.    Marland Kitchen atorvastatin (LIPITOR) 10 MG tablet TAKE 1 TABLET BY MOUTH EVERY DAY (Patient taking differently: Take 10 mg by mouth every morning. ) 90 tablet 1  . Carboxymethylcellulose Sodium (THERATEARS OP) Apply 1 drop to eye daily as needed (dry eyes).    . Cholecalciferol (VITAMIN D3) 5000 UNITS TABS Take 5,000 Units by mouth See admin instructions. Take 5000 units by mouth every 4 days    . clindamycin (CLEOCIN T) 1 % external solution Apply 1 application topically daily  as needed (for bumps in scalp). Apply to scalp  2  . Cranberry 1000 MG CAPS Take 2 capsules at bedtime by mouth.    . diclofenac sodium (VOLTAREN) 1 % GEL APPLY 2 G TOPICALLY 2 (TWO) TIMES DAILY. (Patient taking differently: Apply 2 g topically 2 (two) times daily as needed. ) 100 g 1  . finasteride (PROSCAR) 5 MG tablet Take 5 mg by mouth every morning.   3  . HUMIRA PEN 40 MG/0.4ML PNKT   6  . metoprolol succinate (TOPROL-XL) 50 MG 24 hr tablet Take by mouth 2 (two) times daily. Take 25 mg in the morning and 50 mg in the evening     . metoprolol tartrate (LOPRESSOR) 25 MG tablet TAKE 1 TABLET BY MOUTH EVERY 6 HOURS AS NEEDED FOR FAST HEART RATES 30 tablet 6  . Multiple Vitamins-Minerals (ICAPS PO) Take 1 tablet by mouth 2 (two) times daily.     . Omega-3 Fatty Acids (OMEGA 3 PO) Take 3 capsules by mouth every evening.     Marland Kitchen omeprazole (PRILOSEC) 20 MG capsule Take 20 mg by mouth as needed.     . predniSONE (STERAPRED UNI-PAK 48 TAB) 5 MG (48) TBPK tablet every morning.     . senna (SENOKOT) 8.6 MG tablet Take 1 tablet by mouth at bedtime.    . tamsulosin (FLOMAX) 0.4 MG CAPS capsule TAKE ONE CAPSULE BY MOUTH EVERY DAY AFTER SAME MEAL (Patient taking differently: Take 0.4 mg by mouth every morning. ) 90 capsule 1  . vitamin B-12 (CYANOCOBALAMIN) 500 MCG tablet Take 500 mcg by mouth at bedtime.     Alveda Reasons 20 MG TABS tablet TAKE 1 TABLET BY MOUTH EVERY DAY 90 tablet 3   No current facility-administered medications on file prior to visit.     Review of Systems:  As per HPI- otherwise negative. No fever or chills  Physical Examination: Vitals:   07/02/18 0956  BP: 122/68  Pulse: 98  Resp: 16  Temp: 97.8 F (36.6 C)  SpO2: 95%   Vitals:   07/02/18 0956  Weight: 219 lb (99.3 kg)  Height: 6' (1.829 m)   Body mass index is 29.7 kg/m. Ideal Body Weight:  Weight in (lb) to have BMI = 25: 183.9  GEN: WDWN, NAD, Non-toxic, A & O x 3, looks well and younger than age  79:  Atraumatic, Normocephalic. Neck supple. No masses, No LAD. Ears and Nose: No external deformity. CV: RRR, No M/G/R. No JVD. No thrill. No extra heart sounds. PULM: CTA B, no wheezes, crackles, rhonchi. No retractions. No resp. distress. No accessory muscle use. ABD: S, NT, ND, +BS. No rebound. No HSM. EXTR: No c/c/e NEURO Normal gait.  PSYCH: Normally interactive. Conversant. Not depressed or anxious appearing.  Calm demeanor.    Assessment and Plan: Hyperlipidemia, unspecified hyperlipidemia type  Essential hypertension  Primary osteoarthritis, unspecified site  Seronegative rheumatoid arthritis ([place])  Paroxysmal atrial fibrillation ([place])  Immunization due  Here today to establish care as a new primary care patient He is in NSR today, continue Xarelto, metoprolol Pressures well controlled Recommended shingrix once he is through with his prostatectomy  He plans to see me for a physical in July  Signed Lamar Blinks, MD

## 2018-07-01 ENCOUNTER — Other Ambulatory Visit: Payer: Self-pay

## 2018-07-01 ENCOUNTER — Encounter (HOSPITAL_BASED_OUTPATIENT_CLINIC_OR_DEPARTMENT_OTHER): Payer: Self-pay | Admitting: *Deleted

## 2018-07-01 NOTE — Progress Notes (Addendum)
Spoke w/ pt via phone for pre-op interview.  Npo after mn.  Arrive at [facility].  Needs istat.  Current ekg in chart and epic.  Will take am meds w/ sips of water dos.  Cardiac telephone clearance , dr Rayann Heman, dated 06-12-2018 in chart and epic.  Per pt aware to stop xarelto 3 days prior to surgery.  Dr Jerrilyn Cairo note dated 12-06-2017 in chart and epic.  ECHO dated 06-11-2017 in chart.  Reviewed [facility] guidelines ,  Pt to bring medication's in prescription bottles.  Also ,  Pt to bring cpap.

## 2018-07-02 ENCOUNTER — Ambulatory Visit: Payer: Medicare Other | Admitting: Family Medicine

## 2018-07-02 ENCOUNTER — Other Ambulatory Visit: Payer: Self-pay

## 2018-07-02 ENCOUNTER — Encounter: Payer: Self-pay | Admitting: Family Medicine

## 2018-07-02 ENCOUNTER — Encounter (HOSPITAL_BASED_OUTPATIENT_CLINIC_OR_DEPARTMENT_OTHER): Payer: Self-pay

## 2018-07-02 ENCOUNTER — Emergency Department (HOSPITAL_BASED_OUTPATIENT_CLINIC_OR_DEPARTMENT_OTHER)
Admission: EM | Admit: 2018-07-02 | Discharge: 2018-07-02 | Disposition: A | Discharge status: Home / Self Care | Payer: Medicare Other | Attending: Emergency Medicine | Admitting: Emergency Medicine

## 2018-07-02 VITALS — BP 122/68 | HR 98 | Temp 97.8°F | Resp 16 | Ht 72.0 in | Wt 219.0 lb

## 2018-07-02 DIAGNOSIS — M1991 Primary osteoarthritis, unspecified site: Secondary | ICD-10-CM

## 2018-07-02 DIAGNOSIS — M0600 Rheumatoid arthritis without rheumatoid factor, unspecified site: Secondary | ICD-10-CM

## 2018-07-02 DIAGNOSIS — Z96641 Presence of right artificial hip joint: Secondary | ICD-10-CM | POA: Insufficient documentation

## 2018-07-02 DIAGNOSIS — I10 Essential (primary) hypertension: Secondary | ICD-10-CM

## 2018-07-02 DIAGNOSIS — E785 Hyperlipidemia, unspecified: Secondary | ICD-10-CM | POA: Diagnosis not present

## 2018-07-02 DIAGNOSIS — Z7901 Long term (current) use of anticoagulants: Secondary | ICD-10-CM | POA: Insufficient documentation

## 2018-07-02 DIAGNOSIS — Z23 Encounter for immunization: Secondary | ICD-10-CM

## 2018-07-02 DIAGNOSIS — I480 Paroxysmal atrial fibrillation: Secondary | ICD-10-CM

## 2018-07-02 DIAGNOSIS — T839XXA Unspecified complication of genitourinary prosthetic device, implant and graft, initial encounter: Secondary | ICD-10-CM | POA: Diagnosis present

## 2018-07-02 DIAGNOSIS — Z87891 Personal history of nicotine dependence: Secondary | ICD-10-CM | POA: Diagnosis not present

## 2018-07-02 DIAGNOSIS — Z96652 Presence of left artificial knee joint: Secondary | ICD-10-CM | POA: Insufficient documentation

## 2018-07-02 DIAGNOSIS — R1030 Lower abdominal pain, unspecified: Secondary | ICD-10-CM | POA: Diagnosis not present

## 2018-07-02 DIAGNOSIS — Z96651 Presence of right artificial knee joint: Secondary | ICD-10-CM | POA: Insufficient documentation

## 2018-07-02 DIAGNOSIS — Y69 Unspecified misadventure during surgical and medical care: Secondary | ICD-10-CM | POA: Insufficient documentation

## 2018-07-02 LAB — URINALYSIS, ROUTINE W REFLEX MICROSCOPIC
Bilirubin Urine: NEGATIVE
Glucose, UA: NEGATIVE mg/dL
Ketones, ur: NEGATIVE mg/dL
Nitrite: POSITIVE — AB
Protein, ur: 30 mg/dL — AB
SPECIFIC GRAVITY, URINE: 1.01 (ref 1.005–1.030)
pH: 6 (ref 5.0–8.0)

## 2018-07-02 LAB — URINALYSIS, MICROSCOPIC (REFLEX): WBC, UA: >50 WBC/hpf (ref 0–5)

## 2018-07-02 NOTE — ED Notes (Signed)
Seth Bake, RN irrigated pt's catheter , changed the draining bag. Pt is comfortable.

## 2018-07-02 NOTE — ED Triage Notes (Signed)
Pt states he had foley placed 5 weeks ago-cath not draining-attempted to irrigate PTA with no results-NAD-steady gait

## 2018-07-02 NOTE — ED Notes (Addendum)
irrigated cath , 150 cc urine returned , switched from leg bag to drainage bag  With instruction, u/a collected from new bag , no blood or clots noted

## 2018-07-02 NOTE — ED Provider Notes (Signed)
Emergency Department Provider Note   I have reviewed the triage vital signs and the nursing notes.   HISTORY  Chief Complaint Foley cath not draining   HPI Connor [name] is a 85 y.o. male who has a history of BPH status post TURP many years ago but with recurrence of hypertrophy so has had a indwelling Foley catheter for proximally 5 weeks and plan for another TURP on Monday.  Patient had decreased urine and increased suprapubic pain earlier tonight so came here for further evaluation.  Attempted to irrigate at home and it seemed to help. No other associated or modifying symptoms.    Past Medical History:  Diagnosis Date  . Benign localized prostatic hyperplasia with lower urinary tract symptoms (LUTS)    urologist-- dr Diona Fanti  . Chronic anticoagulation    Xarelto for afib  . Chronic constipation   . Chronic cystitis   . Gross hematuria   . History of DVT of lower extremity 06/08/2010   right femoral dvt dx post op total hip 05-08-2010  . History of gastric ulcer    remote  . History of kidney stones   . Hyperlipidemia   . Hypertension   . NICM (nonischemic cardiomyopathy) ([place])    per echo 06-11-2017,  ef 45-50% with diffuse hypokinesis,  G1DD  . OSA on CPAP    compliant with CPAP.  managed by Dr Elsworth Soho.  . Osteoarthritis   . Other urethral stricture, male, meatal    chronic;  pt self dilates  . Paroxysmal atrial fibrillation [facility]) cardiologist-- dr Rayann Heman   first dx 2009/   a. documented by Dr Barrett Shell on office EKG 9/12. b. maintained on Tikosyn and Xarelto. c. s/p DCCV 's.  d.  x3  EP w/ ablation atrial fib last one 12-05-2016  . Prediabetes   . Premature ventricular contraction   . PVC's (premature ventricular contractions)   . RA (rheumatoid arthritis) [facility])    rheumatologist--- dr aTrudie Reed,  treated with Humira  . RBBB (right bundle branch block)   . Rheumatoid arthritis ([place])   . Vitamin D deficiency   . Wears hearing aid in both ears     Patient  Active Problem List   Diagnosis Date Noted  . Gastrocnemius strain, right, initial encounter 12/10/2017  . Baker's cyst of knee, right 12/10/2017  . Obesity 03/24/2017  . Paroxysmal atrial fibrillation ([place]) 12/05/2016  . Near syncope 04/20/2016  . Leg hematoma, left, initial encounter 04/20/2016  . Bleeding   . Ecchymosis   . Left hamstring muscle strain   . Muscle tear   . RBBB 08/30/2015  . A-fib ([place]) 08/29/2015  . Chronic systolic dysfunction of left ventricle 07/11/2014  . OSA (obstructive sleep apnea) 04/07/2013  . Multinodular goiter 01/20/2013  . Cervical spondylosis without myelopathy 01/18/2013  . Atrial fibrillation with RVR ([place]) 10/27/2012  . PAF (paroxysmal atrial fibrillation) ([place]) 05/28/2012  . Left ventricular dysfunction 12/17/2011  . Low back pain 09/11/2011  . Fatigue 05/22/2011  . PVC (premature ventricular contraction) 01/18/2011  . Persistent atrial fibrillation 01/12/2011  . Hyperlipidemia 08/02/2010  . Hypertension 08/02/2010  . Osteoarthritis 08/02/2010  . BPH (benign prostatic hyperplasia) 08/02/2010  . GERD (gastroesophageal reflux disease) 08/02/2010  . h/o Atrial flutter ([place]) s/p ablation 2012     Past Surgical History:  Procedure Laterality Date  . ABLATION OF DYSRHYTHMIC FOCUS  08/29/2015  . ATRIAL FIBRILLATION ABLATION  12/05/2016  . ATRIAL FIBRILLATION ABLATION N/A 12/05/2016   Procedure: Atrial Fibrillation  Ablation;  Surgeon: [name], MD;  Location: [place] CV LAB;  Service: Cardiovascular;  Laterality: N/A;  . ATRIAL FLUTTER ABLATION  09/2010   by Greggory Brandy  . BUNIONECTOMY Right 1990s  . CARDIOVERSION N/A 10/28/2012   Procedure: CARDIOVERSION;  Surgeon: Burnell Blanks, MD;  Location: [place];  Service: Cardiovascular;  Laterality: N/A;  . CATARACT EXTRACTION W/ INTRAOCULAR LENS  IMPLANT, BILATERAL  2019  . ELECTROPHYSIOLOGIC STUDY N/A 08/29/2015   Procedure: Atrial Fibrillation Ablation;  Surgeon: [name], MD;  Location:  [place] CV LAB;  Service: Cardiovascular;  Laterality: N/A;  . KNEE SURGERY Bilateral x3   1960s & 1970s   open  . LEFT HEART CATHETERIZATION WITH CORONARY ANGIOGRAM N/A 06/07/2011   Procedure: LEFT HEART CATHETERIZATION WITH CORONARY ANGIOGRAM;  Surgeon: Peter M Martinique, MD;  Location: [facility] CATH LAB;  Service: Cardiovascular;  Laterality: N/A;    Normal coronary arteries,  low normal LVF  . TONSILLECTOMY AND ADENOIDECTOMY  age 59  . TOTAL HIP ARTHROPLASTY Right 06/04/10    dr Noemi Chapel  @[facility]   . TOTAL KNEE ARTHROPLASTY Bilateral right 1995;  left 1997  . TRANSURETHRAL RESECTION OF PROSTATE  06-29-2002   dr humphries  @[facility]     Current Outpatient Rx  . Order #: [id number] Class: Historical Med  . Order #: [id number] Class: Normal  . Order #: [id number] Class: Historical Med  . Order #: [id number] Class: Historical Med  . Order #: [id number] Class: Historical Med  . Order #: [id number] Class: Historical Med  . Order #: [id number] Class: Normal  . Order #: [id number] Class: Historical Med  . Order #: [id number] Class: Historical Med  . Order #: [id number] Class: Historical Med  . Order #: [id number] Class: Normal  . Order #: [id number] Class: Historical Med  . Order #: [id number] Class: Historical Med  . Order #: [id number] Class: Historical Med  . Order #: [id number] Class: Historical Med  . Order #: [id number] Class: Historical Med  . Order #: [id number] Class: Normal  . Order #: [id number] Class: Historical Med  . Order #: [id number] Class: Normal    Allergies Penicillins  Family History  Problem Relation Age of Onset  . Heart attack Father   . Lung disease Mother   . Arrhythmia Sister   . Atrial fibrillation Brother   . Diabetes Neg Hx     Social History Social History   Tobacco Use  . Smoking status: Former Smoker    Packs/day: 1.00    Years: 30.00    Pack years: 30.00    Types: Cigarettes    Last attempt to quit: 05/06/1978    Years since quitting: 40.1  . Smokeless tobacco: Never Used  Substance Use Topics    . Alcohol use: Yes    Comment: occasionally wine and beer  . Drug use: Never    Review of Systems  All other systems negative except as documented in the HPI. All pertinent positives and negatives as reviewed in the HPI. ____________________________________________   PHYSICAL EXAM:  VITAL SIGNS: ED Triage Vitals  Enc Vitals Group     BP 07/02/18 2049 (!) 150/84     Pulse Rate 07/02/18 2049 (!) 115     Resp 07/02/18 2049 20     Temp 07/02/18 2049 97.8 F (36.6 C)     Temp Source 07/02/18 2049 Oral     SpO2 07/02/18 2049 96 %     Weight 07/02/18 2048 225 lb 12.8 oz (102.4 kg)     Height 07/02/18 2048 6' (1.829 m)     Head Circumference --  Peak Flow --      Pain Score 07/02/18 2047 0     Pain Loc --      Pain Edu? --      Excl. in [place]? --     Constitutional: Alert and oriented. Well appearing and in no acute distress. Eyes: Conjunctivae are normal. PERRL. EOMI. Head: Atraumatic. Nose: No congestion/rhinnorhea. Mouth/Throat: Mucous membranes are moist.  Oropharynx non-erythematous. Neck: No stridor.  No meningeal signs.   Cardiovascular: Normal rate, regular rhythm. Good peripheral circulation. Grossly normal heart sounds.   Respiratory: Normal respiratory effort.  No retractions. Lungs CTAB. Gastrointestinal: Soft and nontender. No distention.  Musculoskeletal: No lower extremity tenderness nor edema. No gross deformities of extremities. Neurologic:  Normal speech and language. No gross focal neurologic deficits are appreciated.  Skin:  Skin is warm, dry and intact. No rash noted.   ____________________________________________   LABS (all labs ordered are listed, but only abnormal results are displayed)  Labs Reviewed  URINALYSIS, ROUTINE W REFLEX MICROSCOPIC - Abnormal; Notable for the following components:      Result Value   APPearance CLOUDY (*)    Hgb urine dipstick LARGE (*)    Protein, ur 30 (*)    Nitrite POSITIVE (*)    Leukocytes,Ua LARGE (*)     All other components within normal limits  URINALYSIS, MICROSCOPIC (REFLEX) - Abnormal; Notable for the following components:   Bacteria, UA MANY (*)    All other components within normal limits  URINE CULTURE   ____________________________________________   INITIAL IMPRESSION / ASSESSMENT AND PLAN / ED COURSE  Patient already had catheter irrigated and flushed and working appropriately prior to my evaluation.  He Artie has Bactrim at home which he started today as a presurgical antibiotic so I will let Dr. Diona Fanti know via epic communication that his urine looks infected.  Culture sent.  No change in management from ER management standpoint this time.  Pertinent labs & imaging results that were available during my care of the patient were reviewed by me and considered in my medical decision making (see chart for details).  ____________________________________________  FINAL CLINICAL IMPRESSION(S) / ED DIAGNOSES  Final diagnoses:  Problem with Foley catheter, initial encounter ([place])     MEDICATIONS GIVEN DURING THIS VISIT:  Medications - No data to display   NEW OUTPATIENT MEDICATIONS STARTED DURING THIS VISIT:  Discharge Medication List as of 07/02/2018  9:24 PM      Note:  This note was prepared with assistance of Dragon voice recognition software. Occasional wrong-word or sound-a-like substitutions may have occurred due to the inherent limitations of voice recognition software.   Merrily Pew, MD 07/02/18 [phone]

## 2018-07-05 LAB — URINE CULTURE: Special Requests: NORMAL

## 2018-07-05 NOTE — H&P (Signed)
H&P  Chief Complaint: Urinary retention  History of Present Illness: 85 yo male with retention, catheter dependent. He had a TURP in 2004, and has had worsening ss's since then w/ current retention. He presents for TURP.  Past Medical History:  Diagnosis Date  . Benign localized prostatic hyperplasia with lower urinary tract symptoms (LUTS)    urologist-- dr Diona Fanti  . Chronic anticoagulation    Xarelto for afib  . Chronic constipation   . Chronic cystitis   . Gross hematuria   . History of DVT of lower extremity 06/08/2010   right femoral dvt dx post op total hip 05-08-2010  . History of gastric ulcer    remote  . History of kidney stones   . Hyperlipidemia   . Hypertension   . NICM (nonischemic cardiomyopathy) ([place])    per echo 06-11-2017,  ef 45-50% with diffuse hypokinesis,  G1DD  . OSA on CPAP    compliant with CPAP.  managed by Dr Elsworth Soho.  . Osteoarthritis   . Other urethral stricture, male, meatal    chronic;  pt self dilates  . Paroxysmal atrial fibrillation [facility]) cardiologist-- dr Rayann Heman   first dx 2009/   a. documented by Dr Barrett Shell on office EKG 9/12. b. maintained on Tikosyn and Xarelto. c. s/p DCCV 's.  d.  x3  EP w/ ablation atrial fib last one 12-05-2016  . Prediabetes   . Premature ventricular contraction   . PVC's (premature ventricular contractions)   . RA (rheumatoid arthritis) [facility])    rheumatologist--- dr aTrudie Reed,  treated with Humira  . RBBB (right bundle branch block)   . Rheumatoid arthritis ([place])   . Vitamin D deficiency   . Wears hearing aid in both ears     Past Surgical History:  Procedure Laterality Date  . ABLATION OF DYSRHYTHMIC FOCUS  08/29/2015  . ATRIAL FIBRILLATION ABLATION  12/05/2016  . ATRIAL FIBRILLATION ABLATION N/A 12/05/2016   Procedure: Atrial Fibrillation Ablation;  Surgeon: [name], MD;  Location: [facility];  Service: Cardiovascular;  Laterality: N/A;  . ATRIAL FLUTTER ABLATION  09/2010   by Greggory Brandy  . BUNIONECTOMY  Right 1990s  . CARDIOVERSION N/A 10/28/2012   Procedure: CARDIOVERSION;  Surgeon: Burnell Blanks, MD;  Location: [facility];  Service: Cardiovascular;  Laterality: N/A;  . CATARACT EXTRACTION W/ INTRAOCULAR LENS  IMPLANT, BILATERAL  2019  . ELECTROPHYSIOLOGIC STUDY N/A 08/29/2015   Procedure: Atrial Fibrillation Ablation;  Surgeon: [name], MD;  Location: [facility];  Service: Cardiovascular;  Laterality: N/A;  . KNEE SURGERY Bilateral x3   1960s & 1970s   open  . LEFT HEART CATHETERIZATION WITH CORONARY ANGIOGRAM N/A 06/07/2011   Procedure: LEFT HEART CATHETERIZATION WITH CORONARY ANGIOGRAM;  Surgeon: Peter M Martinique, MD;  Location: [facility] CATH LAB;  Service: Cardiovascular;  Laterality: N/A;    Normal coronary arteries,  low normal LVF  . TONSILLECTOMY AND ADENOIDECTOMY  age 31  . TOTAL HIP ARTHROPLASTY Right 06/04/10    dr Noemi Chapel  @[facility]   . TOTAL KNEE ARTHROPLASTY Bilateral right 1995;  left 1997  . TRANSURETHRAL RESECTION OF PROSTATE  06-29-2002   dr humphries  @[facility]     Home Medications:  Allergies as of 07/05/2018      Reactions   Penicillins Itching, Swelling, Other (See Comments)   Has patient had a PCN reaction causing immediate rash, facial/tongue/throat swelling, SOB or lightheadedness with hypotension: Yes Has patient had a PCN reaction causing severe rash involving mucus membranes or  skin necrosis: No Has patient had a PCN reaction that required hospitalization No Has patient had a PCN reaction occurring within the last 10 years: No If all of the above answers are "NO", then may proceed with Cephalosporin use.      Medication List    Notice   Cannot display discharge medications because the patient has not yet been admitted.     Allergies:  Allergies  Allergen Reactions  . Penicillins Itching, Swelling and Other (See Comments)    Has patient had a PCN reaction causing immediate rash, facial/tongue/throat swelling, SOB or lightheadedness with hypotension: Yes Has  patient had a PCN reaction causing severe rash involving mucus membranes or skin necrosis: No Has patient had a PCN reaction that required hospitalization No Has patient had a PCN reaction occurring within the last 10 years: No If all of the above answers are "NO", then may proceed with Cephalosporin use.     Family History  Problem Relation Age of Onset  . Heart attack Father   . Lung disease Mother   . Arrhythmia Sister   . Atrial fibrillation Brother   . Diabetes Neg Hx     Social History:  reports that he quit smoking about 40 years ago. His smoking use included cigarettes. He has a 30.00 pack-year smoking history. He has never used smokeless tobacco. He reports current alcohol use. He reports that he does not use drugs.  ROS: A complete review of systems was performed.  All systems are negative except for pertinent findings as noted.  Physical Exam:  Vital signs in last 24 hours:   Constitutional:  Alert and oriented, No acute distress Cardiovascular: Regular rate  Respiratory: Normal respiratory effort GI: Abdomen is soft, nontender, nondistended, no abdominal masses. No CVAT.  Genitourinary: Normal male phallus, testes are descended bilaterally and non-tender and without masses, scrotum is normal in appearance without lesions or masses, perineum is normal on inspection. Lymphatic: No lymphadenopathy Neurologic: Grossly intact, no focal deficits Psychiatric: Normal mood and affect  Laboratory Data:  No results for input(s): WBC, HGB, HCT, PLT in the last 72 hours.  No results for input(s): NA, K, CL, GLUCOSE, BUN, CALCIUM, CREATININE in the last 72 hours.  Invalid input(s): CO3   No results found for this or any previous visit (from the past 24 hour(s)). Recent Results (from the past 240 hour(s))  Urine C&S     Status: Abnormal   Collection Time: 07/02/18  9:08 PM  Result Value Ref Range Status   Specimen Description   Final    URINE, CATHETERIZED Performed at  [facility], [address] [place], [place] [address]    Special Requests   Final    Normal Performed at [facility], [address] [place], [place] [address]    Culture (A)  Final    >=100,000 COLONIES/mL ENTEROBACTER INTERMEDIUS >=100,000 COLONIES/mL ENTEROCOCCUS FAECALIS    Report Status 07/05/2018 FINAL  Final   Organism ID, Bacteria ENTEROBACTER INTERMEDIUS (A)  Final   Organism ID, Bacteria ENTEROCOCCUS FAECALIS (A)  Final      Susceptibility   Enterococcus faecalis - MIC*    AMPICILLIN <=2 SENSITIVE Sensitive     LEVOFLOXACIN 1 SENSITIVE Sensitive     NITROFURANTOIN <=16 SENSITIVE Sensitive     VANCOMYCIN 1 SENSITIVE Sensitive     * >=100,000 COLONIES/mL ENTEROCOCCUS FAECALIS   Enterobacter intermedius - MIC*    AMPICILLIN >=32 RESISTANT Resistant     CEFAZOLIN <=  4 SENSITIVE Sensitive     CEFTRIAXONE <=1 SENSITIVE Sensitive     CIPROFLOXACIN <=0.25 SENSITIVE Sensitive     GENTAMICIN <=1 SENSITIVE Sensitive     IMIPENEM <=0.25 SENSITIVE Sensitive     NITROFURANTOIN 64 INTERMEDIATE Intermediate     TRIMETH/SULFA <=20 SENSITIVE Sensitive     AMPICILLIN/SULBACTAM 4 SENSITIVE Sensitive     PIP/TAZO <=4 SENSITIVE Sensitive     * >=100,000 COLONIES/mL ENTEROBACTER INTERMEDIUS    Renal Function: No results for input(s): CREATININE in the last 168 hours. CrCl cannot be calculated (Patient's most recent lab result is older than the maximum 21 days allowed.).  Radiologic Imaging: No results found.  Impression/Assessment:  BPH with retention  Plan:  TURP

## 2018-07-06 ENCOUNTER — Ambulatory Visit (HOSPITAL_BASED_OUTPATIENT_CLINIC_OR_DEPARTMENT_OTHER): Payer: Medicare Other | Admitting: Anesthesiology

## 2018-07-06 ENCOUNTER — Observation Stay (HOSPITAL_BASED_OUTPATIENT_CLINIC_OR_DEPARTMENT_OTHER)
Admission: RE | Admit: 2018-07-06 | Discharge: 2018-07-07 | Disposition: A | Discharge status: Home / Self Care | Payer: Medicare Other | Attending: Urology | Admitting: Urology

## 2018-07-06 ENCOUNTER — Encounter (HOSPITAL_BASED_OUTPATIENT_CLINIC_OR_DEPARTMENT_OTHER)
Admission: RE | Disposition: A | Discharge status: Home / Self Care | Payer: Self-pay | Source: Home / Self Care | Attending: Urology

## 2018-07-06 ENCOUNTER — Other Ambulatory Visit: Payer: Self-pay

## 2018-07-06 ENCOUNTER — Ambulatory Visit: Payer: Medicare Other | Admitting: Internal Medicine

## 2018-07-06 ENCOUNTER — Encounter (HOSPITAL_BASED_OUTPATIENT_CLINIC_OR_DEPARTMENT_OTHER): Payer: Self-pay | Admitting: Emergency Medicine

## 2018-07-06 ENCOUNTER — Telehealth: Payer: Self-pay

## 2018-07-06 DIAGNOSIS — Z96641 Presence of right artificial hip joint: Secondary | ICD-10-CM | POA: Insufficient documentation

## 2018-07-06 DIAGNOSIS — I480 Paroxysmal atrial fibrillation: Secondary | ICD-10-CM | POA: Insufficient documentation

## 2018-07-06 DIAGNOSIS — Z96653 Presence of artificial knee joint, bilateral: Secondary | ICD-10-CM | POA: Insufficient documentation

## 2018-07-06 DIAGNOSIS — R7303 Prediabetes: Secondary | ICD-10-CM | POA: Diagnosis not present

## 2018-07-06 DIAGNOSIS — C61 Malignant neoplasm of prostate: Secondary | ICD-10-CM | POA: Diagnosis not present

## 2018-07-06 DIAGNOSIS — N401 Enlarged prostate with lower urinary tract symptoms: Secondary | ICD-10-CM | POA: Diagnosis not present

## 2018-07-06 DIAGNOSIS — Z9841 Cataract extraction status, right eye: Secondary | ICD-10-CM | POA: Insufficient documentation

## 2018-07-06 DIAGNOSIS — Z87891 Personal history of nicotine dependence: Secondary | ICD-10-CM | POA: Diagnosis not present

## 2018-07-06 DIAGNOSIS — I110 Hypertensive heart disease with heart failure: Secondary | ICD-10-CM | POA: Diagnosis not present

## 2018-07-06 DIAGNOSIS — Z87442 Personal history of urinary calculi: Secondary | ICD-10-CM | POA: Diagnosis not present

## 2018-07-06 DIAGNOSIS — G4733 Obstructive sleep apnea (adult) (pediatric): Secondary | ICD-10-CM | POA: Diagnosis not present

## 2018-07-06 DIAGNOSIS — I509 Heart failure, unspecified: Secondary | ICD-10-CM | POA: Diagnosis not present

## 2018-07-06 DIAGNOSIS — N138 Other obstructive and reflux uropathy: Secondary | ICD-10-CM | POA: Diagnosis present

## 2018-07-06 DIAGNOSIS — N320 Bladder-neck obstruction: Secondary | ICD-10-CM | POA: Diagnosis not present

## 2018-07-06 DIAGNOSIS — E785 Hyperlipidemia, unspecified: Secondary | ICD-10-CM | POA: Diagnosis not present

## 2018-07-06 DIAGNOSIS — Z79899 Other long term (current) drug therapy: Secondary | ICD-10-CM | POA: Diagnosis not present

## 2018-07-06 DIAGNOSIS — I4510 Unspecified right bundle-branch block: Secondary | ICD-10-CM | POA: Insufficient documentation

## 2018-07-06 DIAGNOSIS — M069 Rheumatoid arthritis, unspecified: Secondary | ICD-10-CM | POA: Diagnosis not present

## 2018-07-06 DIAGNOSIS — Z836 Family history of other diseases of the respiratory system: Secondary | ICD-10-CM | POA: Insufficient documentation

## 2018-07-06 DIAGNOSIS — Z7901 Long term (current) use of anticoagulants: Secondary | ICD-10-CM | POA: Diagnosis not present

## 2018-07-06 DIAGNOSIS — Z86718 Personal history of other venous thrombosis and embolism: Secondary | ICD-10-CM | POA: Diagnosis not present

## 2018-07-06 DIAGNOSIS — Z8719 Personal history of other diseases of the digestive system: Secondary | ICD-10-CM | POA: Diagnosis not present

## 2018-07-06 DIAGNOSIS — I493 Ventricular premature depolarization: Secondary | ICD-10-CM | POA: Insufficient documentation

## 2018-07-06 DIAGNOSIS — Z9842 Cataract extraction status, left eye: Secondary | ICD-10-CM | POA: Diagnosis not present

## 2018-07-06 DIAGNOSIS — N3020 Other chronic cystitis without hematuria: Secondary | ICD-10-CM | POA: Diagnosis not present

## 2018-07-06 DIAGNOSIS — K5909 Other constipation: Secondary | ICD-10-CM | POA: Diagnosis not present

## 2018-07-06 DIAGNOSIS — Z8249 Family history of ischemic heart disease and other diseases of the circulatory system: Secondary | ICD-10-CM | POA: Insufficient documentation

## 2018-07-06 DIAGNOSIS — E559 Vitamin D deficiency, unspecified: Secondary | ICD-10-CM | POA: Insufficient documentation

## 2018-07-06 DIAGNOSIS — I428 Other cardiomyopathies: Secondary | ICD-10-CM | POA: Insufficient documentation

## 2018-07-06 DIAGNOSIS — R338 Other retention of urine: Secondary | ICD-10-CM | POA: Diagnosis not present

## 2018-07-06 DIAGNOSIS — Z880 Allergy status to penicillin: Secondary | ICD-10-CM | POA: Insufficient documentation

## 2018-07-06 HISTORY — DX: Personal history of peptic ulcer disease: Z87.11

## 2018-07-06 HISTORY — DX: Personal history of other diseases of the digestive system: Z87.19

## 2018-07-06 HISTORY — DX: Gross hematuria: R31.0

## 2018-07-06 HISTORY — DX: Obstructive sleep apnea (adult) (pediatric): G47.33

## 2018-07-06 HISTORY — DX: Other chronic cystitis without hematuria: N30.20

## 2018-07-06 HISTORY — DX: Dependence on other enabling machines and devices: Z99.89

## 2018-07-06 HISTORY — DX: Personal history of urinary calculi: Z87.442

## 2018-07-06 HISTORY — DX: Other urethral stricture, male, meatal: N35.811

## 2018-07-06 HISTORY — DX: Presence of external hearing-aid: Z97.4

## 2018-07-06 HISTORY — DX: Rheumatoid arthritis, unspecified: M06.9

## 2018-07-06 HISTORY — DX: Other constipation: K59.09

## 2018-07-06 HISTORY — DX: Benign prostatic hyperplasia with lower urinary tract symptoms: N40.1

## 2018-07-06 HISTORY — PX: TRANSURETHRAL RESECTION OF PROSTATE: SHX73

## 2018-07-06 HISTORY — DX: Other cardiomyopathies: I42.8

## 2018-07-06 HISTORY — DX: Personal history of other venous thrombosis and embolism: Z86.718

## 2018-07-06 HISTORY — DX: Ventricular premature depolarization: I49.3

## 2018-07-06 LAB — POCT I-STAT 4, (NA,K, GLUC, HGB,HCT)
Glucose, Bld: 127 mg/dL — ABNORMAL HIGH (ref 70–99)
HCT: 37 % — ABNORMAL LOW (ref 39.0–52.0)
Hemoglobin: 12.6 g/dL — ABNORMAL LOW (ref 13.0–17.0)
Potassium: 3.9 mmol/L (ref 3.5–5.1)
Sodium: 138 mmol/L (ref 135–145)

## 2018-07-06 SURGERY — TURP (TRANSURETHRAL RESECTION OF PROSTATE)
Anesthesia: General | Site: Prostate

## 2018-07-06 MED ORDER — LACTATED RINGERS IV SOLN
INTRAVENOUS | Status: DC
  Administered 2018-07-06: 08:00:00 via INTRAVENOUS
  Filled 2018-07-06: qty 1000

## 2018-07-06 MED ORDER — CIPROFLOXACIN IN D5W 400 MG/200ML IV SOLN
400.0000 mg | INTRAVENOUS | Status: AC
  Administered 2018-07-06: 400 mg via INTRAVENOUS
  Filled 2018-07-06: qty 200

## 2018-07-06 MED ORDER — ONDANSETRON HCL 4 MG/2ML IJ SOLN
INTRAMUSCULAR | Status: DC | PRN
  Administered 2018-07-06: 4 mg via INTRAVENOUS

## 2018-07-06 MED ORDER — METOPROLOL SUCCINATE ER 25 MG PO TB24
25.0000 mg | ORAL_TABLET | Freq: Two times a day (BID) | ORAL | Status: DC
  Administered 2018-07-06 – 2018-07-07 (×2): 25 mg via ORAL
  Filled 2018-07-06: qty 1

## 2018-07-06 MED ORDER — HYDROCODONE-ACETAMINOPHEN 5-325 MG PO TABS
1.0000 | ORAL_TABLET | ORAL | Status: DC | PRN
  Filled 2018-07-06: qty 2

## 2018-07-06 MED ORDER — ONDANSETRON HCL 4 MG/2ML IJ SOLN
INTRAMUSCULAR | Status: AC
  Filled 2018-07-06: qty 2

## 2018-07-06 MED ORDER — PANTOPRAZOLE SODIUM 40 MG PO TBEC
40.0000 mg | DELAYED_RELEASE_TABLET | Freq: Every day | ORAL | Status: DC
  Administered 2018-07-07: 40 mg via ORAL
  Filled 2018-07-06: qty 1

## 2018-07-06 MED ORDER — PROPOFOL 10 MG/ML IV BOLUS
INTRAVENOUS | Status: DC | PRN
  Administered 2018-07-06: 180 mg via INTRAVENOUS

## 2018-07-06 MED ORDER — DEXAMETHASONE SODIUM PHOSPHATE 10 MG/ML IJ SOLN
INTRAMUSCULAR | Status: DC | PRN
  Administered 2018-07-06: 4 mg via INTRAVENOUS

## 2018-07-06 MED ORDER — SODIUM CHLORIDE 0.9 % IR SOLN
3000.0000 mL | Status: DC
  Administered 2018-07-06: 3000 mL
  Filled 2018-07-06: qty 3000

## 2018-07-06 MED ORDER — ZOLPIDEM TARTRATE 5 MG PO TABS
5.0000 mg | ORAL_TABLET | Freq: Every evening | ORAL | Status: DC | PRN
  Filled 2018-07-06: qty 1

## 2018-07-06 MED ORDER — CIPROFLOXACIN IN D5W 400 MG/200ML IV SOLN
INTRAVENOUS | Status: AC
  Filled 2018-07-06: qty 200

## 2018-07-06 MED ORDER — FENTANYL CITRATE (PF) 100 MCG/2ML IJ SOLN
INTRAMUSCULAR | Status: DC | PRN
  Administered 2018-07-06 (×2): 50 ug via INTRAVENOUS

## 2018-07-06 MED ORDER — LIDOCAINE 2% (20 MG/ML) 5 ML SYRINGE
INTRAMUSCULAR | Status: AC
  Filled 2018-07-06: qty 5

## 2018-07-06 MED ORDER — SENNA 8.6 MG PO TABS
1.0000 | ORAL_TABLET | Freq: Every day | ORAL | Status: DC
  Administered 2018-07-06: 8.6 mg via ORAL
  Filled 2018-07-06 (×3): qty 1

## 2018-07-06 MED ORDER — SODIUM CHLORIDE 0.45 % IV SOLN
INTRAVENOUS | Status: DC
  Administered 2018-07-06: 12:00:00 via INTRAVENOUS
  Filled 2018-07-06 (×2): qty 1000

## 2018-07-06 MED ORDER — FENTANYL CITRATE (PF) 100 MCG/2ML IJ SOLN
INTRAMUSCULAR | Status: AC
  Filled 2018-07-06: qty 2

## 2018-07-06 MED ORDER — PREDNISONE 5 MG (48) PO TBPK
ORAL_TABLET | Freq: Every morning | ORAL | Status: DC
  Administered 2018-07-07: 08:00:00 via ORAL
  Filled 2018-07-06: qty 48

## 2018-07-06 MED ORDER — BELLADONNA ALKALOIDS-OPIUM 16.2-60 MG RE SUPP
1.0000 | Freq: Four times a day (QID) | RECTAL | Status: DC | PRN
  Administered 2018-07-06: 1 via RECTAL
  Filled 2018-07-06: qty 1

## 2018-07-06 MED ORDER — PROMETHAZINE HCL 25 MG/ML IJ SOLN
6.2500 mg | INTRAMUSCULAR | Status: DC | PRN
  Filled 2018-07-06: qty 1

## 2018-07-06 MED ORDER — ACETAMINOPHEN 325 MG PO TABS
650.0000 mg | ORAL_TABLET | ORAL | Status: DC | PRN
  Filled 2018-07-06: qty 2

## 2018-07-06 MED ORDER — DEXAMETHASONE SODIUM PHOSPHATE 10 MG/ML IJ SOLN
INTRAMUSCULAR | Status: AC
  Filled 2018-07-06: qty 1

## 2018-07-06 MED ORDER — POLYVINYL ALCOHOL 1.4 % OP SOLN
1.0000 [drp] | Freq: Every day | OPHTHALMIC | Status: DC | PRN
  Filled 2018-07-06: qty 15

## 2018-07-06 MED ORDER — SODIUM CHLORIDE 0.9 % IR SOLN
Status: DC | PRN
  Administered 2018-07-06: 6000 mL via INTRAVESICAL

## 2018-07-06 MED ORDER — PHENYLEPHRINE 40 MCG/ML (10ML) SYRINGE FOR IV PUSH (FOR BLOOD PRESSURE SUPPORT)
PREFILLED_SYRINGE | INTRAVENOUS | Status: AC
  Filled 2018-07-06: qty 20

## 2018-07-06 MED ORDER — ONDANSETRON HCL 4 MG/2ML IJ SOLN
4.0000 mg | INTRAMUSCULAR | Status: DC | PRN
  Filled 2018-07-06: qty 2

## 2018-07-06 MED ORDER — BELLADONNA ALKALOIDS-OPIUM 16.2-60 MG RE SUPP
RECTAL | Status: AC
  Filled 2018-07-06: qty 1

## 2018-07-06 MED ORDER — FENTANYL CITRATE (PF) 100 MCG/2ML IJ SOLN
25.0000 ug | INTRAMUSCULAR | Status: DC | PRN
  Administered 2018-07-06 (×2): 25 ug via INTRAVENOUS
  Filled 2018-07-06: qty 1

## 2018-07-06 MED ORDER — PHENYLEPHRINE HCL 10 MG/ML IJ SOLN
INTRAMUSCULAR | Status: DC | PRN
  Administered 2018-07-06 (×8): 100 ug via INTRAVENOUS

## 2018-07-06 MED ORDER — TAMSULOSIN HCL 0.4 MG PO CAPS
0.4000 mg | ORAL_CAPSULE | Freq: Every morning | ORAL | Status: DC
  Filled 2018-07-06: qty 1

## 2018-07-06 MED ORDER — METOPROLOL TARTRATE 25 MG PO TABS
25.0000 mg | ORAL_TABLET | Freq: Four times a day (QID) | ORAL | Status: DC | PRN
  Filled 2018-07-06: qty 1

## 2018-07-06 MED ORDER — BELLADONNA ALKALOIDS-OPIUM 16.2-30 MG RE SUPP
RECTAL | Status: AC
  Filled 2018-07-06: qty 1

## 2018-07-06 MED ORDER — ATORVASTATIN CALCIUM 10 MG PO TABS
10.0000 mg | ORAL_TABLET | Freq: Every morning | ORAL | Status: DC
  Administered 2018-07-07: 10 mg via ORAL
  Filled 2018-07-06: qty 1

## 2018-07-06 MED ORDER — PROPOFOL 10 MG/ML IV BOLUS
INTRAVENOUS | Status: AC
  Filled 2018-07-06: qty 20

## 2018-07-06 MED ORDER — LIDOCAINE HCL (CARDIAC) PF 100 MG/5ML IV SOSY
PREFILLED_SYRINGE | INTRAVENOUS | Status: DC | PRN
  Administered 2018-07-06: 60 mg via INTRAVENOUS

## 2018-07-06 MED ORDER — LEVOFLOXACIN 500 MG PO TABS
500.0000 mg | ORAL_TABLET | Freq: Every day | ORAL | Status: DC
  Administered 2018-07-06 – 2018-07-07 (×2): 500 mg via ORAL
  Filled 2018-07-06 (×3): qty 1

## 2018-07-06 SURGICAL SUPPLY — 28 items
BAG DRAIN URO-CYSTO SKYTR STRL (DRAIN) ×3 IMPLANT
BAG URINE DRAINAGE (UROLOGICAL SUPPLIES) ×3 IMPLANT
BAG URINE LEG 19OZ MD ST LTX (BAG) IMPLANT
CATH FOLEY 2WAY SLVR  5CC 20FR (CATHETERS)
CATH FOLEY 2WAY SLVR  5CC 22FR (CATHETERS)
CATH FOLEY 2WAY SLVR 30CC 22FR (CATHETERS) IMPLANT
CATH FOLEY 2WAY SLVR 5CC 20FR (CATHETERS) IMPLANT
CATH FOLEY 2WAY SLVR 5CC 22FR (CATHETERS) IMPLANT
CATH FOLEY 3WAY 30CC 22F (CATHETERS) IMPLANT
CATH HEMA 3WAY 30CC 24FR COUDE (CATHETERS) ×3 IMPLANT
CATH HEMA 3WAY 30CC 24FR RND (CATHETERS) IMPLANT
CLOTH BEACON ORANGE TIMEOUT ST (SAFETY) ×3 IMPLANT
ELECT REM PT RETURN 9FT ADLT (ELECTROSURGICAL) ×3
ELECTRODE REM PT RTRN 9FT ADLT (ELECTROSURGICAL) ×1 IMPLANT
EVACUATOR MICROVAS BLADDER (UROLOGICAL SUPPLIES) IMPLANT
GLOVE BIO SURGEON STRL SZ8 (GLOVE) ×3 IMPLANT
GOWN STRL REUS W/TWL XL LVL3 (GOWN DISPOSABLE) ×3 IMPLANT
HOLDER FOLEY CATH W/STRAP (MISCELLANEOUS) ×3 IMPLANT
KIT TURNOVER CYSTO (KITS) ×3 IMPLANT
LOOP CUT BIPOLAR 24F LRG (ELECTROSURGICAL) ×3 IMPLANT
MANIFOLD NEPTUNE II (INSTRUMENTS) ×3 IMPLANT
PACK CYSTO (CUSTOM PROCEDURE TRAY) ×3 IMPLANT
PLUG CATH AND CAP STER (CATHETERS) IMPLANT
SYR 30ML LL (SYRINGE) ×3 IMPLANT
SYRINGE IRR TOOMEY STRL 70CC (SYRINGE) ×3 IMPLANT
TUBE CONNECTING 12'X1/4 (SUCTIONS) ×1
TUBE CONNECTING 12X1/4 (SUCTIONS) ×2 IMPLANT
TUBING UROLOGY SET (TUBING) ×3 IMPLANT

## 2018-07-06 NOTE — Anesthesia Postprocedure Evaluation (Signed)
Anesthesia Post Note  Patient: Connor [name]  Procedure(s) Performed: TRANSURETHRAL RESECTION OF THE PROSTATE (TURP) (N/A Prostate)     Patient location during evaluation: PACU Anesthesia Type: General Level of consciousness: awake and alert Pain management: pain level controlled Vital Signs Assessment: post-procedure vital signs reviewed and stable Respiratory status: spontaneous breathing, nonlabored ventilation, respiratory function stable and patient connected to nasal cannula oxygen Cardiovascular status: blood pressure returned to baseline and stable Postop Assessment: no apparent nausea or vomiting Anesthetic complications: no    Last Vitals:  Vitals:   07/06/18 1117 07/06/18 1130  BP: 124/79 124/78  Pulse:  83  Resp:  19  Temp: (!) 36.3 C   SpO2:  97%    Last Pain:  Vitals:   07/06/18 1140  TempSrc:   PainSc: 5                  Laxmi Choung S

## 2018-07-06 NOTE — Anesthesia Procedure Notes (Signed)
Procedure Name: LMA Insertion Date/Time: 07/06/2018 10:22 AM Performed by: Jonna Munro, CRNA Pre-anesthesia Checklist: Patient identified, Emergency Drugs available, Suction available, Patient being monitored and Timeout performed Patient Re-evaluated:Patient Re-evaluated prior to induction Oxygen Delivery Method: Circle system utilized Preoxygenation: Pre-oxygenation with 100% oxygen Induction Type: IV induction LMA: LMA inserted LMA Size: 5.0 Number of attempts: 1 Placement Confirmation: positive ETCO2 and breath sounds checked- equal and bilateral Tube secured with: Tape Dental Injury: Teeth and Oropharynx as per pre-operative assessment

## 2018-07-06 NOTE — Op Note (Signed)
Preoperative diagnosis: 1. Bladder outlet obstruction secondary to BPH  Postoperative diagnosis:  1. Bladder outlet obstruction secondary to BPH  Procedure:  1. Cystoscopy 2. Transurethral resection of the prostate  Surgeon: Lillette Boxer. Bette Brienza, M.D.  Anesthesia: General  Complications: None  Drain: Foley catheter  EBL: Minimal  Specimens: 1. Prostate chips  Disposition of specimens: Pathology  Indication: Connor [name] is a patient with bladder outlet obstruction secondary to benign prostatic hyperplasia.  He underwent TURP approximately 16 years ago.  He has had recurrent retention, recently necessitating Foley catheter drainage.  He has had difficulty with bleeding at times and clots obstructing his catheter.  This most recently happened this weekend.  Because of this, it was recommended that he undergo repeat TURP from obstructing regrowth of the prostate.  . We have discussed the potential benefits and risks of the procedure, side effects of the proposed treatment, the likelihood of the patient achieving the goals of the procedure, and any potential problems that might occur during the procedure or recuperation. Informed consent has been obtained.  Description of procedure:  The patient was identified in the holding area. He received preoperative antibiotics. He was then taken to the operating room. General anesthetic was administered.  The patient was then placed in the dorsal lithotomy position, prepped and draped in the usual sterile fashion. Timeout was then performed.  A resectoscope sheath was placed using the visual obturator.  The bladder was then systematically examined in its entirety.  There was no evidence of urothelial abnormalities except for posterior cystic changes consistent with catheter related trauma.  There were scattered cellules.  Mild trabeculation was noted.  The resectoscope and cutting loop were then placed.  The ureteral orifices were  identified so as to be avoided during the procedure.  These were fairly close to the bladder neck following his initial resection.  Additionally, verumontanum was somewhat obscured.  The prostate adenoma was then resected utilizing loop cautery resection with the bipolar cutting loop.  The prostate adenoma from the bladder neck back to the verumontanum was resected beginning at the six o'clock position and then extended to include the right and left lobes of the prostate and anterior prostate, respectively. Care was taken not to resect distal to the verumontanum or have a significant amount of apical tissue resected.  Hemostasis was then achieved with the cautery and the bladder was emptied and reinspected with no significant bleeding noted at the end of the procedure.  Resected chips were irrigated from the bladder with the Toomey syringe and sent to pathology.  A 3 way 37 French hematuria catheter was then placed into the bladder and placed on continuous bladder irrigation.  The patient appeared to tolerate the procedure well and without complications. The patient was able to be awakened and transferred to the recovery unit in satisfactory condition. He tolerated the procedure well.

## 2018-07-06 NOTE — Addendum Note (Signed)
Encounter addended by: Sherran Needs, NP on: 07/06/2018 8:34 AM  Actions taken: LOS modified

## 2018-07-06 NOTE — Telephone Encounter (Signed)
Post ED Visit - Positive Culture Follow-up  Culture report reviewed by antimicrobial stewardship pharmacist: [facility] Team []  Elenor Quinones, Pharm.D. [x]  Heide Guile, Pharm.D., BCPS AQ-ID []  Parks Neptune, Pharm.D., BCPS []  Alycia Rossetti, Pharm.D., BCPS []  [place], Pharm.D., BCPS, AAHIVP []  Legrand Como, Pharm.D., BCPS, AAHIVP []  Salome Arnt, PharmD, BCPS []  Johnnette Gourd, PharmD, BCPS []  Hughes Better, PharmD, BCPS []  Leeroy Cha, PharmD []  Laqueta Linden, PharmD, BCPS []  Albertina Parr, PharmD  [facility] Team []  Leodis Sias, PharmD []  Lindell Spar, PharmD []  Royetta Asal, PharmD []  Graylin Shiver, Rph []  Rema Fendt) Glennon Mac, PharmD []  Arlyn Dunning, PharmD []  Netta Cedars, PharmD []  Dia Sitter, PharmD []  Leone Haven, PharmD []  Gretta Arab, PharmD []  Theodis Shove, PharmD []  Peggyann Juba, PharmD []  Reuel Boom, PharmD   Positive urine culture  and no further patient follow-up is required at this time.  Genia Del 07/06/2018, 9:23 AM

## 2018-07-06 NOTE — Discharge Instructions (Signed)
Transurethral Resection of the Prostate ° °Care After ° °Refer to this sheet in the next few weeks. These discharge instructions provide you with general information on caring for yourself after you leave the hospital. Your caregiver may also give you specific instructions. Your treatment has been planned according to the most current medical practices available, but unavoidable complications sometimes occur. If you have any problems or questions after discharge, please call your caregiver. ° °HOME CARE INSTRUCTIONS  ° °Medications °· You may receive medicine for pain management. As your level of discomfort decreases, adjustments in your pain medicines may be made.  °· Take all medicines as directed.  °· You may be given a medicine (antibiotic) to kill germs following surgery. Finish all medicines. Let your caregiver know if you have any side effects or problems from the medicine.  °· If you are on aspirin, it would be best not to restart the aspirin until the blood in the urine clears °Hygiene °· You can take a shower after surgery.  °· You should not take a bath while you still have the urethral catheter. °Activity °· You will be encouraged to get out of bed as much as possible and increase your activity level as tolerated.  °· Spend the first week in and around your home. For 3 weeks, avoid the following:  °· Straining.  °· Running.  °· Strenuous work.  °· Walks longer than a few blocks.  °· Riding for extended periods.  °· Sexual relations.  °· Do not lift heavy objects (more than 20 pounds) for at least 1 month. When lifting, use your arms instead of your abdominal muscles.  °· You will be encouraged to walk as tolerated. Do not exert yourself. Increase your activity level slowly. Remember that it is important to keep moving after an operation of any type. This cuts down on the possibility of developing blood clots.  °· Your caregiver will tell you when you can resume driving and light housework. Discuss this  at your first office visit after discharge. °Diet °· No special diet is ordered after a TURP. However, if you are on a special diet for another medical problem, it should be continued.  °· Normal fluid intake is usually recommended.  °· Avoid alcohol and caffeinated drinks for 2 weeks. They irritate the bladder. Decaffeinated drinks are okay.  °· Avoid spicy foods.  °Bladder Function °· For the first 10 days, empty the bladder whenever you feel a definite desire. Do not try to hold the urine for long periods of time.  °· Urinating once or twice a night even after you are healed is not uncommon.  °· You may see some recurrence of blood in the urine after discharge from the hospital. This usually happens within 2 weeks after the procedure.If this occurs, force fluids again as you did in the hospital and reduce your activity.  °Bowel Function °· You may experience some constipation after surgery. This can be minimized by increasing fluids and fiber in your diet. Drink enough water and fluids to keep your urine clear or pale yellow.  °· A stool softener may be prescribed for use at home. Do not strain to move your bowels.  °· If you are requiring increased pain medicine, it is important that you take stool softeners to prevent constipation. This will help to promote proper healing by reducing the need to strain to move your bowels.  °Sexual Activity °· Semen movement in the opposite direction and into the bladder (  retrograde ejaculation) may occur. Since the semen passes into the bladder, cloudy urine can occur the first time you urinate after intercourse. Or, you may not have an ejaculation during erection. Ask your caregiver when you can resume sexual activity. Retrograde ejaculation and reduced semen discharge should not reduce one's pleasure of intercourse.  °Postoperative Visit °· Arrange the date and time of your after surgery visit with your caregiver.  °Return to Work °· After your recovery is complete, you will  be able to return to work and resume all activities. Your caregiver will inform you when you can return to work.  °Foley Catheter Care °A soft, flexible tube (Foley catheter) may have been placed in your bladder to drain urine and fluid. Follow these instructions: °Taking Care of the Catheter °· Keep the area where the catheter leaves your body clean.  °· Attach the catheter to the leg so there is no tension on the catheter.  °· Keep the drainage bag below the level of the bladder, but keep it OFF the floor.  °· Do not take long soaking baths. Your caregiver will give instructions about showering.  °· Wash your hands before touching ANYTHING related to the catheter or bag.  °· Using mild soap and warm water on a washcloth:  °· Clean the area closest to the catheter insertion site using a circular motion around the catheter.  °· Clean the catheter itself by wiping AWAY from the insertion site for several inches down the tube.  °· NEVER wipe upward as this could sweep bacteria up into the urethra (tube in your body that normally drains the bladder) and cause infection.  °· Place a small amount of sterile lubricant at the tip of the penis where the catheter is entering.  °Taking Care of the Drainage Bags °· Two drainage bags may be taken home: a large overnight drainage bag, and a smaller leg bag which fits underneath clothing.  °· It is okay to wear the overnight bag at any time, but NEVER wear the smaller leg bag at night.  °· Keep the drainage bag well below the level of your bladder. This prevents backflow of urine into the bladder and allows the urine to drain freely.  °· Anchor the tubing to your leg to prevent pulling or tension on the catheter. Use tape or a leg strap provided by the hospital.  °· Empty the drainage bag when it is 1/2 to 3/4 full. Wash your hands before and after touching the bag.  °· Periodically check the tubing for kinks to make sure there is no pressure on the tubing which could restrict  the flow of urine.  °Changing the Drainage Bags °· Cleanse both ends of the clean bag with alcohol before changing.  °· Pinch off the rubber catheter to avoid urine spillage during the disconnection.  °· Disconnect the dirty bag and connect the clean one.  °· Empty the dirty bag carefully to avoid a urine spill.  °· Attach the new bag to the leg with tape or a leg strap.  °Cleaning the Drainage Bags °· Whenever a drainage bag is disconnected, it must be cleaned quickly so it is ready for the next use.  °· Wash the bag in warm, soapy water.  °· Rinse the bag thoroughly with warm water.  °· Soak the bag for 30 minutes in a solution of white vinegar and water (1 cup vinegar to 1 quart warm water).  °· Rinse with warm water.  °SEEK MEDICAL   CARE IF:  °· You have chills or night sweats.  °· You are leaking around your catheter or have problems with your catheter. It is not uncommon to have sporadic leakage around your catheter as a result of bladder spasms. If the leakage stops, there is not much need for concern. If you are uncertain, call your caregiver.  °· You develop side effects that you think are coming from your medicines.  °SEEK IMMEDIATE MEDICAL CARE IF:  °· You are suddenly unable to urinate. Check to see if there are any kinks in the drainage tubing that may cause this. If you cannot find any kinks, call your caregiver immediately. This is an emergency.  °· You develop shortness of breath or chest pains.  °· Bleeding persists or clots develop in your urine.  °· You have a fever.  °· You develop pain in your back or over your lower belly (abdomen).  °· You develop pain or swelling in your legs.  °· Any problems you are having get worse rather than better.  °MAKE SURE YOU:  °· Understand these instructions.  °· Will watch your condition.  °· Will get help right away if you are not doing well or get worse.  °Document Released: 04/22/2005 Document Revised: 01/02/2011 Document Reviewed: 12/14/2008 °[employer]®  Patient Information ©2012 [employer].Transurethral Resection of the Prostate °Care After °Refer to this sheet in the next few weeks. These discharge instructions provide you with general information on caring for yourself after you leave the hospital. Your caregiver may also give you specific instructions. Your treatment has been planned according to the most current medical practices available, but unavoidable complications sometimes occur. If you have any problems or questions after discharge, please call your caregiver. °

## 2018-07-06 NOTE — Anesthesia Preprocedure Evaluation (Signed)
Anesthesia Evaluation  Patient identified by MRN, date of birth, ID band Patient awake    Reviewed: Allergy & Precautions, NPO status , Patient's Chart, lab work & pertinent test results  Airway Mallampati: II  TM Distance: >3 FB Neck ROM: Full    Dental no notable dental hx.    Pulmonary neg pulmonary ROS, former smoker,    Pulmonary exam normal breath sounds clear to auscultation       Cardiovascular hypertension, +CHF  Normal cardiovascular exam+ dysrhythmias Atrial Fibrillation  Rhythm:Irregular Rate:Normal     Neuro/Psych negative neurological ROS  negative psych ROS   GI/Hepatic negative GI ROS, Neg liver ROS,   Endo/Other  negative endocrine ROS  Renal/GU negative Renal ROS  negative genitourinary   Musculoskeletal  (+) Arthritis , Rheumatoid disorders,    Abdominal   Peds negative pediatric ROS (+)  Hematology negative hematology ROS (+)   Anesthesia Other Findings   Reproductive/Obstetrics negative OB ROS                             Anesthesia Physical Anesthesia Plan  ASA: III  Anesthesia Plan: General   Post-op Pain Management:    Induction: Intravenous  PONV Risk Score and Plan: 2 and Ondansetron, Dexamethasone and Treatment may vary due to age or medical condition  Airway Management Planned: LMA  Additional Equipment:   Intra-op Plan:   Post-operative Plan: Extubation in OR  Informed Consent: I have reviewed the patients History and Physical, chart, labs and discussed the procedure including the risks, benefits and alternatives for the proposed anesthesia with the patient or authorized representative who has indicated his/her understanding and acceptance.     Dental advisory given  Plan Discussed with: CRNA and Surgeon  Anesthesia Plan Comments:         Anesthesia Quick Evaluation

## 2018-07-06 NOTE — Transfer of Care (Signed)
Immediate Anesthesia Transfer of Care Note  Patient: Connor [name]  Procedure(s) Performed: TRANSURETHRAL RESECTION OF THE PROSTATE (TURP) (N/A Prostate)  Patient Location: PACU  Anesthesia Type:General  Level of Consciousness: awake, alert  and oriented  Airway & Oxygen Therapy: Patient Spontanous Breathing and Patient connected to face mask oxygen  Post-op Assessment: Report given to RN and Post -op Vital signs reviewed and stable  Post vital signs: Reviewed and stable  Last Vitals:  Vitals Value Taken Time  BP    Temp    Pulse    Resp    SpO2      Last Pain:  Vitals:   07/06/18 0730  TempSrc: Oral  PainSc: 0-No pain         Complications: No apparent anesthesia complications

## 2018-07-06 NOTE — Interval H&P Note (Signed)
History and Physical Interval Note:  07/06/2018 10:02 AM  Connor [name]  has presented today for surgery, with the diagnosis of BENIGN PROSTATIC HYPERPLASIA  The various methods of treatment have been discussed with the patient and family. After consideration of risks, benefits and other options for treatment, the patient has consented to  Procedure(s) with comments: [place] (TURP) (N/A) - 45 MINS as a surgical intervention .  The patient's history has been reviewed, patient examined, no change in status, stable for surgery.  I have reviewed the patient's chart and labs.  Questions were answered to the patient's satisfaction.     Lillette Boxer Sumner Boesch

## 2018-07-07 DIAGNOSIS — N401 Enlarged prostate with lower urinary tract symptoms: Secondary | ICD-10-CM | POA: Diagnosis not present

## 2018-07-07 NOTE — Discharge Summary (Signed)
Physician Discharge Summary  Patient ID: Connor [name] MRN: [id number] DOB/AGE: Jun 02, 1933 85 y.o.  Admit date: 07/06/2018 Discharge date: 07/07/2018  Admission Diagnoses:  Enlarged prostate with urinary obstruction  Discharge Diagnoses:  Principal Problem:   Enlarged prostate with urinary obstruction   Past Medical History:  Diagnosis Date  . Benign localized prostatic hyperplasia with lower urinary tract symptoms (LUTS)    urologist-- dr Diona Fanti  . Chronic anticoagulation    Xarelto for afib  . Chronic constipation   . Chronic cystitis   . Gross hematuria   . History of DVT of lower extremity 06/08/2010   right femoral dvt dx post op total hip 05-08-2010  . History of gastric ulcer    remote  . History of kidney stones   . Hyperlipidemia   . Hypertension   . NICM (nonischemic cardiomyopathy) ([place])    per echo 06-11-2017,  ef 45-50% with diffuse hypokinesis,  G1DD  . OSA on CPAP    compliant with CPAP.  managed by Dr Elsworth Soho.  . Osteoarthritis   . Other urethral stricture, male, meatal    chronic;  pt self dilates  . Paroxysmal atrial fibrillation [facility]) cardiologist-- dr Rayann Heman   first dx 2009/   a. documented by Dr Barrett Shell on office EKG 9/12. b. maintained on Tikosyn and Xarelto. c. s/p DCCV 's.  d.  x3  EP w/ ablation atrial fib last one 12-05-2016  . Prediabetes   . Premature ventricular contraction   . PVC's (premature ventricular contractions)   . RA (rheumatoid arthritis) [facility])    rheumatologist--- dr aTrudie Reed,  treated with Humira  . RBBB (right bundle branch block)   . Rheumatoid arthritis ([place])   . Vitamin D deficiency   . Wears hearing aid in both ears     Surgeries: Procedure(s): TRANSURETHRAL RESECTION OF THE PROSTATE (TURP) on 07/06/2018   Consultants (if any):   Discharged Condition: Improved  Hospital Course: Connor [name] is an 85 y.o. male who was admitted 07/06/2018 with a diagnosis of Enlarged prostate with urinary obstruction and went to  the operating room on 07/06/2018 and underwent the above named procedures.  The foley was removed on 3/3 and he voided without complaints.  He was felt to be ready for D/C home.   He was given perioperative antibiotics:  Anti-infectives (From admission, onward)   Start     Dose/Rate Route Frequency Ordered Stop   07/06/18 1300  levofloxacin (LEVAQUIN) tablet 500 mg    Note to Pharmacy:  1st dose @ 1300   500 mg Oral Daily 07/06/18 1259     07/06/18 0737  ciprofloxacin (CIPRO) IVPB 400 mg     400 mg 200 mL/hr over 60 Minutes Intravenous 60 min pre-op 07/06/18 0737 07/06/18 1044    .  He was given sequential compression devices for DVT prophylaxis.  He benefited maximally from the hospital stay and there were no complications.    Recent vital signs:  Vitals:   07/07/18 0100 07/07/18 0455  BP: 116/72 124/75  Pulse: (!) 109 87  Resp: 16 17  Temp: 98 F (36.7 C) 97.6 F (36.4 C)  SpO2: 95% 95%    Recent laboratory studies:  Lab Results  Component Value Date   HGB 12.6 (L) 07/06/2018   HGB 13.9 11/17/2017   HGB 14.4 11/21/2016   Lab Results  Component Value Date   WBC 4.9 11/17/2017   PLT 115.0 Repeated and verified X2. (L) 11/17/2017   Lab Results  Component Value Date   INR 1.43 10/28/2012   Lab Results  Component Value Date   NA 138 07/06/2018   K 3.9 07/06/2018   CL 105 01/16/2018   CO2 24 01/16/2018   BUN 18 01/16/2018   CREATININE 0.77 01/16/2018   GLUCOSE 127 (H) 07/06/2018    Discharge Medications:   Allergies as of 07/07/2018      Reactions   Penicillins Itching, Swelling, Other (See Comments)   Has patient had a PCN reaction causing immediate rash, facial/tongue/throat swelling, SOB or lightheadedness with hypotension: Yes Has patient had a PCN reaction causing severe rash involving mucus membranes or skin necrosis: No Has patient had a PCN reaction that required hospitalization No Has patient had a PCN reaction occurring within the last 10 years:  No If all of the above answers are "NO", then may proceed with Cephalosporin use.      Medication List    STOP taking these medications   tamsulosin 0.4 MG Caps capsule Commonly known as:  FLOMAX     TAKE these medications   acetaminophen 500 MG tablet Commonly known as:  TYLENOL Take 1,000 mg by mouth 2 (two) times daily as needed for moderate pain.   atorvastatin 10 MG tablet Commonly known as:  LIPITOR TAKE 1 TABLET BY MOUTH EVERY DAY What changed:  when to take this   clindamycin 1 % external solution Commonly known as:  CLEOCIN T Apply 1 application topically daily as needed (for bumps in scalp). Apply to scalp   Cranberry 1000 MG Caps Take 2 capsules at bedtime by mouth.   diclofenac sodium 1 % Gel Commonly known as:  VOLTAREN APPLY 2 G TOPICALLY 2 (TWO) TIMES DAILY. What changed:    when to take this  reasons to take this   finasteride 5 MG tablet Commonly known as:  PROSCAR Take 5 mg by mouth every morning.   HUMIRA PEN 40 MG/0.4ML Pnkt Generic drug:  Adalimumab   ICAPS PO Take 1 tablet by mouth 2 (two) times daily.   metoprolol succinate 50 MG 24 hr tablet Commonly known as:  TOPROL-XL Take by mouth 2 (two) times daily. Take 25 mg in the morning and 50 mg in the evening   metoprolol tartrate 25 MG tablet Commonly known as:  LOPRESSOR TAKE 1 TABLET BY MOUTH EVERY 6 HOURS AS NEEDED FOR FAST HEART RATES   OMEGA 3 PO Take 3 capsules by mouth every evening.   omeprazole 20 MG capsule Commonly known as:  PRILOSEC Take 20 mg by mouth as needed.   predniSONE 5 MG (48) Tbpk tablet Commonly known as:  STERAPRED UNI-PAK 48 TAB every morning.   senna 8.6 MG tablet Commonly known as:  SENOKOT Take 1 tablet by mouth at bedtime.   THERATEARS OP Apply 1 drop to eye daily as needed (dry eyes).   vitamin B-12 500 MCG tablet Commonly known as:  CYANOCOBALAMIN Take 500 mcg by mouth at bedtime.   Vitamin D3 125 MCG (5000 UT) Tabs Take 5,000 Units by  mouth See admin instructions. Take 5000 units by mouth every 4 days   XARELTO 20 MG Tabs tablet Generic drug:  rivaroxaban TAKE 1 TABLET BY MOUTH EVERY DAY       Diagnostic Studies: No results found.  Disposition: Discharge disposition: 01-Home or Self Care       Discharge Instructions    Discharge patient   Complete by:  As directed    Discharge disposition:  01-Home or Self Care  Discharge patient date:  07/07/2018      Follow-up Information    Jed Limerick, NP.   Specialty:  Urology Why:  3.23.2020 @ [address] information: [place] [address] [phone]            Signed: Irine Seal 07/07/2018, 7:27 AM

## 2018-07-07 NOTE — Progress Notes (Signed)
Patient discharged by Dr. Jeffie Pollock. Patient concerned about antibiotic prescription that patient discussed with Dr. Shirley Muscat yesterday. Called made to on call service and discussed with Dr. Jeffie Pollock. Nurse on call has sent a message to Dr. Diona Fanti.

## 2018-07-08 ENCOUNTER — Ambulatory Visit: Payer: Medicare Other | Admitting: Internal Medicine

## 2018-07-08 ENCOUNTER — Encounter (HOSPITAL_BASED_OUTPATIENT_CLINIC_OR_DEPARTMENT_OTHER): Payer: Self-pay | Admitting: Urology

## 2018-07-08 VITALS — BP 108/72 | HR 112 | Ht 72.0 in | Wt 226.0 lb

## 2018-07-08 DIAGNOSIS — G4733 Obstructive sleep apnea (adult) (pediatric): Secondary | ICD-10-CM

## 2018-07-08 DIAGNOSIS — I480 Paroxysmal atrial fibrillation: Secondary | ICD-10-CM | POA: Diagnosis not present

## 2018-07-08 DIAGNOSIS — I428 Other cardiomyopathies: Secondary | ICD-10-CM | POA: Diagnosis not present

## 2018-07-08 DIAGNOSIS — I10 Essential (primary) hypertension: Secondary | ICD-10-CM

## 2018-07-08 NOTE — Progress Notes (Signed)
PCP: Darreld Mclean, MD   Primary EP: Dr Nena Polio III is a 85 y.o. male who presents today for routine electrophysiology followup.  Since last being seen in our clinic, the patient reports doing very well.  He continues to have palpitations intermittently. He brings in his Kardia strips for review which appear to be atrial tachycardia. He admits that he has been on prednisone for the last two months for his RA. Of note, he just had prostate surgery and is presently off Xarelto. Today, he denies symptoms of  chest pain, shortness of breath,  lower extremity edema, dizziness, presyncope, or syncope.  The patient is otherwise without complaint today.   Past Medical History:  Diagnosis Date  . Benign localized prostatic hyperplasia with lower urinary tract symptoms (LUTS)    urologist-- dr Diona Fanti  . Chronic anticoagulation    Xarelto for afib  . Chronic constipation   . Chronic cystitis   . Gross hematuria   . History of DVT of lower extremity 06/08/2010   right femoral dvt dx post op total hip 05-08-2010  . History of gastric ulcer    remote  . History of kidney stones   . Hyperlipidemia   . Hypertension   . NICM (nonischemic cardiomyopathy) ([place])    per echo 06-11-2017,  ef 45-50% with diffuse hypokinesis,  G1DD  . OSA on CPAP    compliant with CPAP.  managed by Dr Elsworth Soho.  . Osteoarthritis   . Other urethral stricture, male, meatal    chronic;  pt self dilates  . Paroxysmal atrial fibrillation [facility]) cardiologist-- dr Rayann Heman   first dx 2009/   a. documented by Dr Barrett Shell on office EKG 9/12. b. maintained on Tikosyn and Xarelto. c. s/p DCCV 's.  d.  x3  EP w/ ablation atrial fib last one 12-05-2016  . Prediabetes   . Premature ventricular contraction   . PVC's (premature ventricular contractions)   . RA (rheumatoid arthritis) [facility])    rheumatologist--- dr aTrudie Reed,  treated with Humira  . RBBB (right bundle branch block)   . Rheumatoid arthritis ([place])   .  Vitamin D deficiency   . Wears hearing aid in both ears    Past Surgical History:  Procedure Laterality Date  . ABLATION OF DYSRHYTHMIC FOCUS  08/29/2015  . ATRIAL FIBRILLATION ABLATION  12/05/2016  . ATRIAL FIBRILLATION ABLATION N/A 12/05/2016   Procedure: Atrial Fibrillation Ablation;  Surgeon: [name], MD;  Location: [facility];  Service: Cardiovascular;  Laterality: N/A;  . ATRIAL FLUTTER ABLATION  09/2010   by Greggory Brandy  . BUNIONECTOMY Right 1990s  . CARDIOVERSION N/A 10/28/2012   Procedure: CARDIOVERSION;  Surgeon: Burnell Blanks, MD;  Location: [place];  Service: Cardiovascular;  Laterality: N/A;  . CATARACT EXTRACTION W/ INTRAOCULAR LENS  IMPLANT, BILATERAL  2019  . ELECTROPHYSIOLOGIC STUDY N/A 08/29/2015   Procedure: Atrial Fibrillation Ablation;  Surgeon: [name], MD;  Location: [place];  Service: Cardiovascular;  Laterality: N/A;  . KNEE SURGERY Bilateral x3   1960s & 1970s   open  . LEFT HEART CATHETERIZATION WITH CORONARY ANGIOGRAM N/A 06/07/2011   Procedure: LEFT HEART CATHETERIZATION WITH CORONARY ANGIOGRAM;  Surgeon: Peter M Martinique, MD;  Location: [facility] C[facility];  Service: Cardiovascular;  Laterality: N/A;    Normal coronary arteries,  low normal LVF  . TONSILLECTOMY AND ADENOIDECTOMY  age 45  . TOTAL HIP ARTHROPLASTY Right 06/04/10    dr Noemi Chapel  @  [facility]  . TOTAL KNEE ARTHROPLASTY Bilateral right 1995;  left 1997  . TRANSURETHRAL RESECTION OF PROSTATE  06-29-2002   dr humphries  @[facility]   . TRANSURETHRAL RESECTION OF PROSTATE N/A 07/06/2018   Procedure: TRANSURETHRAL RESECTION OF THE PROSTATE (TURP);  Surgeon: Franchot Gallo, MD;  Location: [facility];  Service: Urology;  Laterality: N/A;  23 MINS    ROS- all systems are reviewed and negatives except as per HPI above  Current Outpatient Medications  Medication Sig Dispense Refill  . acetaminophen (TYLENOL) 500 MG tablet Take 1,000 mg by mouth 2 (two) times daily as needed for moderate  pain.    Marland Kitchen atorvastatin (LIPITOR) 10 MG tablet Take 10 mg by mouth daily.    . Carboxymethylcellulose Sodium (THERATEARS OP) Apply 1 drop to eye daily as needed (dry eyes).    . Cholecalciferol (VITAMIN D3) 5000 UNITS TABS Take 5,000 Units by mouth See admin instructions. Take 5000 units by mouth every 4 days    . clindamycin (CLEOCIN T) 1 % external solution Apply 1 application topically daily as needed (for bumps in scalp). Apply to scalp  2  . Cranberry 1000 MG CAPS Take 2 capsules at bedtime by mouth.    . diclofenac sodium (VOLTAREN) 1 % GEL APPLY 2 G TOPICALLY 2 (TWO) TIMES DAILY. (Patient taking differently: Apply 2 g topically 2 (two) times daily as needed. ) 100 g 1  . finasteride (PROSCAR) 5 MG tablet Take 5 mg by mouth every morning.   3  . HUMIRA PEN 40 MG/0.4ML PNKT   6  . metoprolol succinate (TOPROL-XL) 50 MG 24 hr tablet Take by mouth 2 (two) times daily. Take 25 mg in the morning and 50 mg in the evening     . metoprolol tartrate (LOPRESSOR) 25 MG tablet TAKE 1 TABLET BY MOUTH EVERY 6 HOURS AS NEEDED FOR FAST HEART RATES 30 tablet 6  . Multiple Vitamins-Minerals (ICAPS PO) Take 1 tablet by mouth 2 (two) times daily.     . Omega-3 Fatty Acids (OMEGA 3 PO) Take 3 capsules by mouth every evening.     Marland Kitchen omeprazole (PRILOSEC) 20 MG capsule Take 20 mg by mouth as needed.     . predniSONE (STERAPRED UNI-PAK 48 TAB) 5 MG (48) TBPK tablet every morning.     . senna (SENOKOT) 8.6 MG tablet Take 1 tablet by mouth at bedtime.    . vitamin B-12 (CYANOCOBALAMIN) 500 MCG tablet Take 500 mcg by mouth at bedtime.     Alveda Reasons 20 MG TABS tablet TAKE 1 TABLET BY MOUTH EVERY DAY 90 tablet 3   No current facility-administered medications for this visit.     Physical Exam: Vitals:   07/08/18 1105  BP: 108/72  Pulse: (!) 112  SpO2: 96%  Weight: 226 lb (102.5 kg)  Height: 6' (1.829 m)    GEN- The patient is well appearing, alert and oriented x 3 today.   HEENT-head normocephalic,  atraumatic, sclera clear, conjunctiva pink, hearing intact, trachea midline. Lungs- Clear to ausculation bilaterally, normal work of breathing Heart- Regular rate and rhythm with brief runs of ectopy, no murmurs, rubs or gallops  GI- soft, NT, ND, + BS Extremities- no clubbing, cyanosis, or edema MS- no significant deformity or atrophy Skin- no rash or lesion Psych- euthymic mood, full affect Neuro- strength and sensation are intact   Wt Readings from Last 3 Encounters:  07/08/18 226 lb (102.5 kg)  07/06/18 220 lb 6.4 oz (100 kg)  07/02/18 225 lb 12.8 oz (102.4 kg)    EKG tracing ordered today is personally reviewed and shows SR which converts to atrial tachycardia HR 112, QRS 146, QTc 502, RBBB  Assessment and Plan:  1. Paroxysmal atrial fibrillation Doing well post ablatino off Sherron Flemings personally reviewed today and reveals paroxysmal episodes of atrial tachycardia with rates 90s-110s. Otherwise SR. Patient has been on prednisone for the last two months for his RA and has also recently had prostate surgery. These stressors may be driving tachycardia. Would not change therapy today as patient is asymptomatic.  chads2vasc score is 4.  Continue xarelto when cleared by urology.  continue toprol 25 mg AM and 50 mg PM. Lopressor 25 mg PRN for HR >100.  2. Nonischemic CM EF 45-50% on echo 06/21/17. No sings of fluid overload.  3. HTN Stable, no changes today.  4. OSA Compliance with CPAP encouraged.  5. Previously low K K+ on Bmet 07/06/18 stable.  Follow up in Afib clinic in 6 weeks.  [name] MD, [facility] 07/08/2018 2:00 PM

## 2018-07-08 NOTE — Patient Instructions (Addendum)
Medication Instructions:  Your physician recommends that you continue on your current medications as directed. Please refer to the Current Medication list given to you today.  Labwork: None ordered.  Testing/Procedures: None ordered.  Follow-Up: Your physician wants you to follow-up in: 6 weeks with Ricky at the AFIB clinic.   Any Other Special Instructions Will Be Listed Below (If Applicable).  If you need a refill on your cardiac medications before your next appointment, please call your pharmacy.

## 2018-07-31 ENCOUNTER — Other Ambulatory Visit (HOSPITAL_COMMUNITY): Payer: Self-pay | Admitting: Urology

## 2018-07-31 DIAGNOSIS — C61 Malignant neoplasm of prostate: Secondary | ICD-10-CM

## 2018-08-11 ENCOUNTER — Encounter (HOSPITAL_COMMUNITY): Payer: Self-pay

## 2018-08-13 ENCOUNTER — Other Ambulatory Visit (HOSPITAL_COMMUNITY): Payer: Self-pay | Admitting: Nurse Practitioner

## 2018-08-17 ENCOUNTER — Encounter (HOSPITAL_COMMUNITY): Payer: Self-pay

## 2018-08-18 ENCOUNTER — Encounter (HOSPITAL_COMMUNITY)
Admission: RE | Admit: 2018-08-18 | Discharge: 2018-08-18 | Disposition: A | Discharge status: Home / Self Care | Payer: Medicare Other | Source: Ambulatory Visit | Attending: Urology | Admitting: Urology

## 2018-08-18 ENCOUNTER — Other Ambulatory Visit: Payer: Self-pay

## 2018-08-18 DIAGNOSIS — C61 Malignant neoplasm of prostate: Secondary | ICD-10-CM | POA: Insufficient documentation

## 2018-08-18 MED ORDER — TECHNETIUM TC 99M MEDRONATE IV KIT
21.8000 | PACK | Freq: Once | INTRAVENOUS | Status: AC | PRN
  Administered 2018-08-18: 21.8 via INTRAVENOUS

## 2018-08-19 ENCOUNTER — Encounter (HOSPITAL_COMMUNITY): Payer: Self-pay | Admitting: Physician Assistant

## 2018-08-19 ENCOUNTER — Ambulatory Visit (HOSPITAL_COMMUNITY)
Admission: RE | Admit: 2018-08-19 | Discharge: 2018-08-19 | Disposition: A | Discharge status: Home / Self Care | Payer: Medicare Other | Source: Ambulatory Visit | Attending: Physician Assistant | Admitting: Physician Assistant

## 2018-08-19 VITALS — BP 117/71 | HR 89 | Ht 72.0 in | Wt 219.0 lb

## 2018-08-19 DIAGNOSIS — I10 Essential (primary) hypertension: Secondary | ICD-10-CM

## 2018-08-19 DIAGNOSIS — I480 Paroxysmal atrial fibrillation: Secondary | ICD-10-CM | POA: Diagnosis not present

## 2018-08-19 NOTE — Progress Notes (Signed)
Electrophysiology TeleHealth Note   Due to national recommendations of social distancing due to [place] 19, Audio/video telehealth visit is felt to be most appropriate for this patient at this time.  See MyChart message from today for patient consent regarding telehealth for the Atrial Fibrillation Clinic.    Date:  08/19/2018   ID:  Connor [name], DOB 02-17-1934, MRN [id number]  Location: home  Provider location: [address] [place], [place] [address] Evaluation Performed: Follow up  PCP:  Copland, Gay Filler, MD  Primary Electrophysiologist: Dr Rayann Heman   CC: Follow up for atrial fibrillation    History of Present Illness: Connor [name] is a 85 y.o. male who presents via audio/video conferencing for a telehealth visit today. Patient reports that since his last visit he has done well. He has not had to use his PRN BB. He uses his [name] regularly which shows SR with PACs. Of note, he had a CT and bone scan yesterday to further evaluate his prostate cancer. He has been having blood in his urine intermittently ever since his prostate procedure in March. Per patient, urologist is aware and states it is normal. Patient stopped his Xarelto on his own 2 days ago in anticipation of his scans.  Today, he denies symptoms of palpitations, chest pain, shortness of breath, orthopnea, PND, lower extremity edema, claudication, dizziness, presyncope, syncope, or neurologic sequela. The patient is tolerating medications without difficulties and is otherwise without complaint today.   he denies symptoms of cough, fevers, chills, or new SOB worrisome for COVID 19.     Atrial Fibrillation Risk Factors:  he does have symptoms or diagnosis of sleep apnea. he is compliant with CPAP therapy. he does not have a history of rheumatic fever. he does not have a history of alcohol use. The patient does not have a history of early familial atrial fibrillation or other arrhythmias.  he has a BMI of  Body mass index is 29.7 kg/m.Marland Kitchen Filed Weights   08/19/18 1001  Weight: 99.3 kg    Past Medical History:  Diagnosis Date  . Benign localized prostatic hyperplasia with lower urinary tract symptoms (LUTS)    urologist-- dr Diona Fanti  . Chronic anticoagulation    Xarelto for afib  . Chronic constipation   . Chronic cystitis   . Gross hematuria   . History of DVT of lower extremity 06/08/2010   right femoral dvt dx post op total hip 05-08-2010  . History of gastric ulcer    remote  . History of kidney stones   . Hyperlipidemia   . Hypertension   . NICM (nonischemic cardiomyopathy) ([place])    per echo 06-11-2017,  ef 45-50% with diffuse hypokinesis,  G1DD  . OSA on CPAP    compliant with CPAP.  managed by Dr Elsworth Soho.  . Osteoarthritis   . Other urethral stricture, male, meatal    chronic;  pt self dilates  . Paroxysmal atrial fibrillation [facility]) cardiologist-- dr Rayann Heman   first dx 2009/   a. documented by Dr Barrett Shell on office EKG 9/12. b. maintained on Tikosyn and Xarelto. c. s/p DCCV 's.  d.  x3  EP w/ ablation atrial fib last one 12-05-2016  . Prediabetes   . Premature ventricular contraction   . PVC's (premature ventricular contractions)   . RA (rheumatoid arthritis) [facility])    rheumatologist--- dr aTrudie Reed,  treated with Humira  . RBBB (right bundle branch block)   . Rheumatoid arthritis ([place])   .  Vitamin D deficiency   . Wears hearing aid in both ears    Past Surgical History:  Procedure Laterality Date  . ABLATION OF DYSRHYTHMIC FOCUS  08/29/2015  . ATRIAL FIBRILLATION ABLATION  12/05/2016  . ATRIAL FIBRILLATION ABLATION N/A 12/05/2016   Procedure: Atrial Fibrillation Ablation;  Surgeon: [name], MD;  Location: [place] [facility];  Service: Cardiovascular;  Laterality: N/A;  . ATRIAL FLUTTER ABLATION  09/2010   by Greggory Brandy  . BUNIONECTOMY Right 1990s  . CARDIOVERSION N/A 10/28/2012   Procedure: CARDIOVERSION;  Surgeon: Burnell Blanks, MD;  Location: [place];  Service:  Cardiovascular;  Laterality: N/A;  . CATARACT EXTRACTION W/ INTRAOCULAR LENS  IMPLANT, BILATERAL  2019  . ELECTROPHYSIOLOGIC STUDY N/A 08/29/2015   Procedure: Atrial Fibrillation Ablation;  Surgeon: [name], MD;  Location: [place] [facility];  Service: Cardiovascular;  Laterality: N/A;  . KNEE SURGERY Bilateral x3   1960s & 1970s   open  . LEFT HEART CATHETERIZATION WITH CORONARY ANGIOGRAM N/A 06/07/2011   Procedure: LEFT HEART CATHETERIZATION WITH CORONARY ANGIOGRAM;  Surgeon: Peter M Martinique, MD;  Location[facility];  Service: Cardiovascular;  Laterality: N/A;    Normal coronary arteries,  low normal LVF  . TONSILLECTOMY AND ADENOIDECTOMY  age 7  . TOTAL HIP ARTHROPLASTY Right 06/04/10    dr Noemi Chapel  @[facility]   . TOTAL KNEE ARTHROPLASTY Bilateral right 1995;  left 1997  . TRANSURETHRAL RESECTION OF PROSTATE  06-29-2002   dr humphries  @[facility]   . TRANSURETHRAL RESECTION OF PROSTATE N/A 07/06/2018   Procedure: TRANSURETHRAL RESECTION OF THE PROSTATE (TURP);  Surgeon: Franchot Gallo, MD;  Location: [facility];  Service: Urology;  Laterality: N/A;  45 MINS     Current Outpatient Medications  Medication Sig Dispense Refill  . acetaminophen (TYLENOL) 500 MG tablet Take 1,000 mg by mouth 2 (two) times daily as needed for moderate pain.    Marland Kitchen atorvastatin (LIPITOR) 10 MG tablet Take 10 mg by mouth daily.    . Carboxymethylcellulose Sodium (THERATEARS OP) Apply 1 drop to eye daily as needed (dry eyes).    . Cholecalciferol (VITAMIN D3) 5000 UNITS TABS Take 5,000 Units by mouth See admin instructions. Take 5000 units by mouth every 4 days    . clindamycin (CLEOCIN T) 1 % external solution Apply 1 application topically daily as needed (for bumps in scalp). Apply to scalp  2  . Cranberry 1000 MG CAPS Take 2 capsules at bedtime by mouth.    . diclofenac sodium (VOLTAREN) 1 % GEL APPLY 2 G TOPICALLY 2 (TWO) TIMES DAILY. (Patient taking differently: Apply 2 g topically 2 (two) times daily as  needed. ) 100 g 1  . finasteride (PROSCAR) 5 MG tablet Take 5 mg by mouth every morning.   3  . metoprolol succinate (TOPROL-XL) 50 MG 24 hr tablet Take one half tablet in the morning and 1 tablet at night    . Multiple Vitamins-Minerals (ICAPS PO) Take 1 tablet by mouth 2 (two) times daily.     . Omega-3 Fatty Acids (OMEGA 3 PO) Take 3 capsules by mouth every evening.     Marland Kitchen omeprazole (PRILOSEC) 20 MG capsule Take 20 mg by mouth as needed.     . predniSONE (STERAPRED UNI-PAK 48 TAB) 5 MG (48) TBPK tablet every morning.     . senna (SENOKOT) 8.6 MG tablet Take 1 tablet by mouth at bedtime.    . vitamin B-12 (CYANOCOBALAMIN) 500 MCG tablet Take 500 mcg  by mouth at bedtime.     Marland Kitchen HUMIRA PEN 40 MG/0.4ML PNKT   6  . metoprolol tartrate (LOPRESSOR) 25 MG tablet TAKE 1 TABLET BY MOUTH EVERY 6 HOURS AS NEEDED FOR FAST HEART RATES (Patient not taking: Reported on 08/19/2018) 30 tablet 6  . XARELTO 20 MG TABS tablet TAKE 1 TABLET BY MOUTH EVERY DAY (Patient not taking: Reported on 08/19/2018) 90 tablet 3   No current facility-administered medications for this encounter.     Allergies:   Penicillins   Social History:  The patient  reports that he quit smoking about 40 years ago. His smoking use included cigarettes. He has a 30.00 pack-year smoking history. He has never used smokeless tobacco. He reports current alcohol use. He reports that he does not use drugs.   Family History:  The patient's  family history includes Arrhythmia in his sister; Atrial fibrillation in his brother; Heart attack in his father; Lung disease in his mother.    ROS:  Please see the history of present illness.   All other systems are personally reviewed and negative.   Exam: Well appearing, alert and conversant, regular work of breathing,  good skin color  Recent Labs: 11/17/2017: ALT 17; Platelets 115.0 Repeated and verified X2.; TSH 1.34 01/16/2018: BUN 18; Creatinine, Ser 0.77 07/06/2018: Hemoglobin 12.6; Potassium 3.9;  Sodium 138  personally reviewed    Other studies personally reviewed: Additional studies/ records that were reviewed today include: Epic notes   The patient presents wearable device technology report for my review today. On my review, the patient presents Kardia tracings from the last 4 weeks. The tracings reveal SR with PACs. No afib.   ASSESSMENT AND PLAN:  1. Paroxysmal atrial fibrillation S/p ablation 12/2016. Doing well off AAD. He has not had any episodes of tachycardia noted on Kardia. Continue Toprol and Lopressor as currently dosed. Continue Xarelto 20 mg daily. Encouraged patient to speak with urologist regarding bleeding before stopping Xarelto. We discussed the risks and benefits of anticoagulation as well as his stroke risk.  This patients CHA2DS2-VASc Score and unadjusted Ischemic Stroke Rate (% per year) is equal to 4.8 % stroke rate/year from a score of 4  Above score calculated as 1 point each if present [CHF, HTN, DM, Vascular=MI/PAD/Aortic Plaque, Age if 65-74, or Male] Above score calculated as 2 points each if present [Age > 75, or Stroke/TIA/TE]  2. Nonischemic CM EF 45-50% on echo. No symptoms of overt HF.  3. HTN Stable, no changes today  4. OSA Compliant with CPAP therapy.   COVID screen The patient does not have any symptoms that suggest any further testing/ screening at this time.  Social distancing reinforced today.    Follow-up in AF clinic in 3 months.   Current medicines are reviewed at length with the patient today.   The patient does not have concerns regarding his medicines.  The following changes were made today:  none  Labs/ tests ordered today include:  No orders of the defined types were placed in this encounter.   Patient Risk:  after full review of this patients clinical status, I feel that they are at moderate risk at this time.   Today, I have spent 19 minutes with the patient with telehealth technology discussing atrial  fibrillation, anticoagulation, and COVID-19 precautions.    Gwenlyn Perking PA-C 08/19/2018 10:21 AM  [facility] [address] [place], [place] [address] [phone]

## 2018-08-21 ENCOUNTER — Telehealth (HOSPITAL_COMMUNITY): Payer: Self-pay | Admitting: *Deleted

## 2018-08-21 ENCOUNTER — Encounter (HOSPITAL_COMMUNITY): Payer: Self-pay

## 2018-08-21 NOTE — Telephone Encounter (Signed)
Patient called in stating he returned to NSR overnight. His HR is back in the 70s. Will call if further issues.

## 2018-08-24 ENCOUNTER — Telehealth (HOSPITAL_COMMUNITY): Payer: Self-pay | Admitting: *Deleted

## 2018-08-24 NOTE — Telephone Encounter (Signed)
error 

## 2018-08-24 NOTE — Telephone Encounter (Signed)
Patient was unable to upload his kardia strips to [employer] - emailed them to this RN - I have forwarded them to DR. Allred email. Per patient he also sent a message via mychart to patient. He confirms today he is still in afib. Would like Dr. Jackalyn Lombard advice on what he recommends next. Will send to Dr. Rayann Heman for review.

## 2018-08-26 ENCOUNTER — Telehealth: Payer: Self-pay | Admitting: Internal Medicine

## 2018-08-26 ENCOUNTER — Other Ambulatory Visit (HOSPITAL_COMMUNITY): Payer: Self-pay | Admitting: Urology

## 2018-08-26 DIAGNOSIS — C61 Malignant neoplasm of prostate: Secondary | ICD-10-CM

## 2018-08-26 NOTE — Telephone Encounter (Signed)
   Primary Cardiologist: [name], MD  Chart reviewed as part of pre-operative protocol coverage. Patient was contacted 08/26/2018 in reference to pre-operative risk assessment for pending surgery as outlined below.  Connor [name] was last seen on 08/19/18 by Malka So.  Since that day, Connor [name] has done well. He had an episode of Afib following his last clinic visit, which he treated with PRN lopressor. He is now back in sinus rhythm.  Per our pharmacy staff: Patient with diagnosis of afib on xarelto for anticoagulation.    Procedure: CT directed biopsy of a lymph node Date of procedure: TBD  CHADS2-VASc score of  4 (CHF, HTN, AGE, DM2, stroke/tia x 2, CAD, AGE, male) crcl 9ml/min  Per office protocol, patient can hold Xarelto for 1 day prior to procedure  If he continues to have issues with Afib, he will contact Dr. Jackalyn Lombard office.  Therefore, based on ACC/AHA guidelines, the patient would be at acceptable risk for the planned procedure without further cardiovascular testing.   I will route this recommendation to the requesting party via Epic fax function and remove from pre-op pool.  Please call with questions.  Ledora Bottcher, PA 08/26/2018, 4:37 PM

## 2018-08-26 NOTE — Telephone Encounter (Signed)
Patient with diagnosis of afib on xarelto for anticoagulation.    Procedure: CT directed biopsy of a lymph node Date of procedure: TBD  CHADS2-VASc score of  4 (CHF, HTN, AGE, DM2, stroke/tia x 2, CAD, AGE, male) crcl 57ml/min  Per office protocol, patient can hold Xarelto for 1 day prior to procedure.

## 2018-08-26 NOTE — Telephone Encounter (Signed)
Please comment on holding xarelto.

## 2018-08-26 NOTE — Telephone Encounter (Signed)
     [facility] Pre-operative Risk Assessment    Request for surgical clearance:  1. What type of surgery is being performed? CT directed biopsy of a lymph node  2. When is this surgery scheduled? TBD  3. What type of clearance is required (medical clearance vs. Pharmacy clearance to hold med vs. Both)? Pharmacy clearance  4. Are there any medications that need to be held prior to surgery and how long? Xarelto to be off 24 hours prior to biopsy  5. Practice name and name of physician performing surgery? [facility], Dr Preston Fleeting  6. What is your office phone number [phone] ext [phone]   7.   What is your office fax number [phone]  8.   Anesthesia type (None, local, MAC, general) ? none   Connor [name] 08/26/2018, 2:46 PM  _________________________________________________________________   (provider comments below)

## 2018-08-28 ENCOUNTER — Telehealth: Payer: Self-pay | Admitting: Internal Medicine

## 2018-08-28 NOTE — Telephone Encounter (Signed)
Dr. Diona Fanti called to speak with you about this mutual patient. The number on the call log is his personal cell

## 2018-08-28 NOTE — Telephone Encounter (Signed)
Dr. Diona Fanti wanted to discuss a Lovenox bridge for the pt to prepare for an upcoming procedure.

## 2018-08-31 ENCOUNTER — Other Ambulatory Visit: Payer: Self-pay | Admitting: Urology

## 2018-08-31 ENCOUNTER — Encounter (HOSPITAL_COMMUNITY): Payer: Self-pay

## 2018-08-31 ENCOUNTER — Telehealth: Payer: Self-pay | Admitting: Internal Medicine

## 2018-08-31 NOTE — Telephone Encounter (Signed)
° ° °  Dr Diona Fanti- Urologist requesting to speak directly with Dr Rayann Heman  Please call [phone]

## 2018-08-31 NOTE — Patient Instructions (Addendum)
Connor [name]   Your procedure is scheduled on:09/03/18    Report to [facility] Main  Entrance.   Report to admitting at 9:30 AM    Call this number if you have problems the morning of surgery [phone]    Remember: Do not eat food or drink liquids :After Midnight           . BRUSH YOUR TEETH MORNING OF SURGERY AND RINSE YOUR MOUTH OUT, NO CHEWING GUM CANDY OR MINTS.     Take these medicines the morning of surgery with A SIP OF WATER: Metoprolo tartrate(Toprol XL),Prednisone(Sterapred uni pak),                                                                                                                              fiaisteride(Proscar),Omeprazole(prilosec), eye drops,.                   Please bring C-Pap mask and tubing with you to                                                                                                                                                                                                                                                                                     hospital                                You may not have any metal on your body including  piercings  Do not wear jewelry,  lotions, powders or deodorant                         Men may shave face and neck.   Do not bring valuables to the hospital. [place].  Contacts, dentures or bridgework may not be worn into surgery.  Bring only essentials. Belongings will be held in a locker until you leave the PACU.               Family is not able to go to your room because of Covid 19.    Patients discharged the day of surgery will not be allowed to drive home. IF YOU ARE HAVING SURGERY AND GOING HOME THE SAME DAY, YOU MUST HAVE AN ADULT TO DRIVE                 YOU HOME AND BE WITH YOU FOR 24 HOURS. YOU MAY GO HOME BY TAXI OR UBER OR ORTHERWISE, BUT AN ADULT MUST ACCOMPANY YOU HOME AND STAY WITH YOU                FOR 24 HOURS.   Name and phone number of your driver:  [name] [phone]   Special Instructions: N/A        [facility] - Preparing for Surgery Before surgery, you can play an important role.  Because skin is not sterile, your skin needs to be as free of germs as possible.  You can reduce the number of germs on your skin by washing with CHG (chlorahexidine gluconate) soap before surgery.  CHG is an antiseptic cleaner which kills germs and bonds with the skin to continue killing germs even after washing. Please DO NOT use if you have an allergy to CHG or antibacterial soaps.  If your skin becomes reddened/irritated stop using the CHG and inform your nurse when you arrive at Short Stay. Do not shave (including legs and underarms) for at least 48 hours prior to the first CHG shower.  You may shave your face/neck. Please follow these instructions carefully:   1.  Shower with CHG Soap the night before surgery and the  morning of Surgery.  2.  If you choose to wash your hair, wash your hair first as usual with your  normal  shampoo.  3.  After you shampoo, rinse your hair and body thoroughly to remove the  shampoo.                           4.  Use CHG as you would any other liquid soap.  You can apply chg directly  to the skin and wash                       Gently with a scrungie or clean washcloth.  5.  Apply the CHG Soap to your body ONLY FROM THE NECK DOWN.   Do not use on face/ open                           Wound or open sores. Avoid contact with eyes, ears mouth and genitals (private parts).  Wash face,  Genitals (private parts) with your normal soap.             6.  Wash thoroughly, paying special attention to the area where your surgery  will be performed.  7.  Thoroughly rinse your body with warm water from the neck down.  8.  DO NOT shower/wash with your normal soap after using and rinsing off  the CHG Soap.                9.  Pat yourself  dry with a clean towel.            10.  Wear clean pajamas.            11.  Place clean sheets on your bed the night of your first shower and do not  sleep with pets.  Day of Surgery : Do not apply any lotions/deodorants the morning of surgery.   Please wear clean clothes to the hospital/surgery center.  FAILURE TO FOLLOW THESE INSTRUCTIONS MAY RESULT IN THE CANCELLATION OF YOUR SURGERY PATIENT SIGNATURE_________________________________  NURSE SIGNATURE__________________________________  ________________________________________________________________________

## 2018-08-31 NOTE — Progress Notes (Signed)
SPOKE W/  _ patient      SCREENING SYMPTOMS OF COVID 19:   COUGH--denies   RUNNY NOSE--- denies   SORE THROAT---denies   NASAL CONGESTION----denies   SNEEZING----denies   SHORTNESS OF BREATH---denies   DIFFICULTY BREATHING---denies   TEMP >100.4-----denies   UNEXPLAINED BODY ACHES------denies    HAVE YOU OR ANY FAMILY MEMBER TRAVELLED PAST 14 DAYS OUT OF THE   COUNTY---denies  STATE----denies  COUNTRY----denies   HAVE YOU OR ANY FAMILY MEMBER BEEN EXPOSED TO ANYONE WITH COVID 19?   denies   

## 2018-08-31 NOTE — Telephone Encounter (Signed)
Closing as duplicate.  Dr. Rayann Heman notified of call.  He will return call when able.

## 2018-09-01 ENCOUNTER — Other Ambulatory Visit: Payer: Self-pay

## 2018-09-01 ENCOUNTER — Encounter (HOSPITAL_COMMUNITY): Payer: Self-pay

## 2018-09-01 ENCOUNTER — Other Ambulatory Visit: Payer: Self-pay | Admitting: Radiology

## 2018-09-01 ENCOUNTER — Encounter (HOSPITAL_COMMUNITY)
Admission: RE | Admit: 2018-09-01 | Discharge: 2018-09-01 | Disposition: A | Discharge status: Home / Self Care | Payer: Medicare Other | Source: Ambulatory Visit | Attending: Urology | Admitting: Urology

## 2018-09-01 DIAGNOSIS — Z01812 Encounter for preprocedural laboratory examination: Secondary | ICD-10-CM | POA: Insufficient documentation

## 2018-09-01 DIAGNOSIS — E559 Vitamin D deficiency, unspecified: Secondary | ICD-10-CM | POA: Diagnosis not present

## 2018-09-01 DIAGNOSIS — I428 Other cardiomyopathies: Secondary | ICD-10-CM | POA: Diagnosis not present

## 2018-09-01 DIAGNOSIS — Z8546 Personal history of malignant neoplasm of prostate: Secondary | ICD-10-CM | POA: Diagnosis not present

## 2018-09-01 DIAGNOSIS — Z7901 Long term (current) use of anticoagulants: Secondary | ICD-10-CM | POA: Diagnosis not present

## 2018-09-01 DIAGNOSIS — Z96653 Presence of artificial knee joint, bilateral: Secondary | ICD-10-CM | POA: Diagnosis not present

## 2018-09-01 DIAGNOSIS — Z9842 Cataract extraction status, left eye: Secondary | ICD-10-CM | POA: Diagnosis not present

## 2018-09-01 DIAGNOSIS — Z86718 Personal history of other venous thrombosis and embolism: Secondary | ICD-10-CM | POA: Diagnosis not present

## 2018-09-01 DIAGNOSIS — R7303 Prediabetes: Secondary | ICD-10-CM | POA: Diagnosis not present

## 2018-09-01 DIAGNOSIS — D649 Anemia, unspecified: Secondary | ICD-10-CM | POA: Diagnosis not present

## 2018-09-01 DIAGNOSIS — Z96641 Presence of right artificial hip joint: Secondary | ICD-10-CM | POA: Diagnosis not present

## 2018-09-01 DIAGNOSIS — E785 Hyperlipidemia, unspecified: Secondary | ICD-10-CM | POA: Diagnosis not present

## 2018-09-01 DIAGNOSIS — K5909 Other constipation: Secondary | ICD-10-CM | POA: Diagnosis not present

## 2018-09-01 DIAGNOSIS — R319 Hematuria, unspecified: Secondary | ICD-10-CM | POA: Diagnosis present

## 2018-09-01 DIAGNOSIS — R310 Gross hematuria: Secondary | ICD-10-CM | POA: Diagnosis not present

## 2018-09-01 DIAGNOSIS — Z87891 Personal history of nicotine dependence: Secondary | ICD-10-CM | POA: Diagnosis not present

## 2018-09-01 DIAGNOSIS — Z880 Allergy status to penicillin: Secondary | ICD-10-CM | POA: Diagnosis not present

## 2018-09-01 DIAGNOSIS — Z8711 Personal history of peptic ulcer disease: Secondary | ICD-10-CM | POA: Diagnosis not present

## 2018-09-01 DIAGNOSIS — C61 Malignant neoplasm of prostate: Secondary | ICD-10-CM | POA: Diagnosis not present

## 2018-09-01 DIAGNOSIS — Z9841 Cataract extraction status, right eye: Secondary | ICD-10-CM | POA: Diagnosis not present

## 2018-09-01 DIAGNOSIS — I10 Essential (primary) hypertension: Secondary | ICD-10-CM | POA: Diagnosis not present

## 2018-09-01 DIAGNOSIS — Z87442 Personal history of urinary calculi: Secondary | ICD-10-CM | POA: Diagnosis not present

## 2018-09-01 DIAGNOSIS — C775 Secondary and unspecified malignant neoplasm of intrapelvic lymph nodes: Secondary | ICD-10-CM | POA: Diagnosis not present

## 2018-09-01 DIAGNOSIS — I480 Paroxysmal atrial fibrillation: Secondary | ICD-10-CM | POA: Diagnosis not present

## 2018-09-01 DIAGNOSIS — G4733 Obstructive sleep apnea (adult) (pediatric): Secondary | ICD-10-CM | POA: Diagnosis not present

## 2018-09-01 DIAGNOSIS — M069 Rheumatoid arthritis, unspecified: Secondary | ICD-10-CM | POA: Diagnosis not present

## 2018-09-01 LAB — CBC
HCT: 36.8 % — ABNORMAL LOW (ref 39.0–52.0)
Hemoglobin: 11.4 g/dL — ABNORMAL LOW (ref 13.0–17.0)
MCH: 31.8 pg (ref 26.0–34.0)
MCHC: 31 g/dL (ref 30.0–36.0)
MCV: 102.8 fL — ABNORMAL HIGH (ref 80.0–100.0)
Platelets: 179 10*3/uL (ref 150–400)
RBC: 3.58 MIL/uL — ABNORMAL LOW (ref 4.22–5.81)
RDW: 14.1 % (ref 11.5–15.5)
WBC: 11.9 10*3/uL — ABNORMAL HIGH (ref 4.0–10.5)
nRBC: 0 % (ref 0.0–0.2)

## 2018-09-01 LAB — BASIC METABOLIC PANEL
Anion gap: 6 (ref 5–15)
BUN: 13 mg/dL (ref 8–23)
CO2: 28 mmol/L (ref 22–32)
Calcium: 8.7 mg/dL — ABNORMAL LOW (ref 8.9–10.3)
Chloride: 107 mmol/L (ref 98–111)
Creatinine, Ser: 0.54 mg/dL — ABNORMAL LOW (ref 0.61–1.24)
GFR calc Af Amer: >60 mL/min (ref >60)
GFR calc non Af Amer: >60 mL/min (ref >60)
Glucose, Bld: 145 mg/dL — ABNORMAL HIGH (ref 70–99)
Potassium: 4.1 mmol/L (ref 3.5–5.1)
Sodium: 141 mmol/L (ref 135–145)

## 2018-09-01 NOTE — Progress Notes (Signed)
SMM:OCAREQJ Copland LOV 07/08/18  CARDIOLOGIST:Dr Allred  INFO IN Epic:Cardiac clearance 08/26/18  INFO ON CHART:yes   BLOOD THINNERS AND LAST DOSES: Xarelto stopped 4/28 am dose. Instructed to stop by Dr. Alan Ripper office ____________________________________  PATIENT SYMPTOMS AT TIME OF PREOP:none   Pt instructed to bring c-pap mask and tubing.   Pt instructed to go to Radiology at 9:15 on 4/30 for Bx of lymph node

## 2018-09-02 NOTE — Anesthesia Preprocedure Evaluation (Addendum)
Anesthesia Evaluation  Patient identified by MRN, date of birth, ID band Patient awake    Reviewed: Allergy & Precautions, NPO status , Patient's Chart, lab work & pertinent test results  Airway Mallampati: II  TM Distance: >3 FB     Dental  (+) Dental Advisory Given   Pulmonary sleep apnea , former smoker,    breath sounds clear to auscultation       Cardiovascular hypertension, Pt. on medications and Pt. on home beta blockers + dysrhythmias Atrial Fibrillation  Rhythm:Regular Rate:Normal  Technically difficult study with poor acoustic windows. Mildlydilated LV with EF 45-50%, diffuse hypokinesis. Normal RV sizeand systolic function. No significant valvular abnormalities.   Neuro/Psych negative neurological ROS     GI/Hepatic Neg liver ROS, GERD  ,  Endo/Other  negative endocrine ROS  Renal/GU negative Renal ROS     Musculoskeletal  (+) Arthritis ,   Abdominal   Peds  Hematology  (+) anemia ,   Anesthesia Other Findings   Reproductive/Obstetrics                          Lab Results  Component Value Date   WBC 11.9 (H) 09/01/2018   HGB 11.4 (L) 09/01/2018   HCT 36.8 (L) 09/01/2018   MCV 102.8 (H) 09/01/2018   PLT 179 09/01/2018   Lab Results  Component Value Date   CREATININE 0.54 (L) 09/01/2018   BUN 13 09/01/2018   NA 141 09/01/2018   K 4.1 09/01/2018   CL 107 09/01/2018   CO2 28 09/01/2018    Anesthesia Physical Anesthesia Plan  ASA: III  Anesthesia Plan: General   Post-op Pain Management:    Induction: Intravenous  PONV Risk Score and Plan: 2 and Dexamethasone, Ondansetron and Treatment may vary due to age or medical condition  Airway Management Planned: Oral ETT  Additional Equipment:   Intra-op Plan:   Post-operative Plan: Extubation in OR  Informed Consent: I have reviewed the patients History and Physical, chart, labs and discussed the procedure  including the risks, benefits and alternatives for the proposed anesthesia with the patient or authorized representative who has indicated his/her understanding and acceptance.     Dental advisory given  Plan Discussed with: CRNA  Anesthesia Plan Comments:        Anesthesia Quick Evaluation

## 2018-09-02 NOTE — Progress Notes (Signed)
Anesthesia Chart Review   Case:  [id number] Date/Time:  09/03/18 1145   Procedure:  CYSTOSCOPY WITH BIOPSY (N/A ) - 55 MINS   Anesthesia type:  Choice   Pre-op diagnosis:  PROSTATE CANCER, GROSS HEMATURIA   Location:  [place] / [facility]   Surgeon:  Franchot Gallo, MD      DISCUSSION: 85 yo former smoker 30 pack years (quit 05/06/78) with h/o HLD, A-fib (on Xarelto), OSA w/CPAP, pre-diabetic, right DVT after total hip 05/2010, NICM, HTN, RBBB, RA, prostate cancer, gross hematuria scheduled for above procedure 09/03/18 with Dr. Franchot Gallo.    Per Pharmacist, Marcelle Overlie, [place], "Per office protocol, patient can hold Xarelto for 1 day prior to procedure." Last dose Xarelto 09/01/18 am.   Per cardiology pt is acceptable risk to proceed with procedure.  Telephone note per Fabian Sharp, PA states, "Connor [name] was last seen on 08/19/18 by Malka So.  Since that day, Connor [name] has done well. He had an episode of Afib following his last clinic visit, which he treated with PRN lopressor. He is now back in sinus rhythm.  If he continues to have issues with Afib, he will contact Dr. Jackalyn Lombard office. Therefore, based on ACC/AHA guidelines, the patient would be at acceptable risk for the planned procedure without further cardiovascular testing."  Pt can proceed with planned procedure barring acute status change.  VS: BP (!) 147/92   Pulse 86   Temp 36.7 C (Oral)   Resp 16   Ht 6' (1.829 m)   Wt 102.3 kg   SpO2 97%   BMI 30.60 kg/m   PROVIDERS: Copland, Gay Filler, MD is PCP   [name], MD is Cardiologist  LABS: Labs reviewed: Acceptable for surgery. (all labs ordered are listed, but only abnormal results are displayed)  Labs Reviewed  CBC - Abnormal; Notable for the following components:      Result Value   WBC 11.9 (*)    RBC 3.58 (*)    Hemoglobin 11.4 (*)    HCT 36.8 (*)    MCV 102.8 (*)    All other components within normal limits  BASIC METABOLIC  PANEL - Abnormal; Notable for the following components:   Glucose, Bld 145 (*)    Creatinine, Ser 0.54 (*)    Calcium 8.7 (*)    All other components within normal limits     IMAGES:   EKG: 07/08/2018 Rate 112 bpm Sinus tachycardia with premature atrial complexes with aberrant conduction Right bundle branch block  Abnormal ECG   CV: Echo 06/11/17 Study Conclusions  - Left ventricle: The cavity size was mildly dilated. Wall   thickness was normal. Systolic function was mildly reduced. The   estimated ejection fraction was in the range of 45% to 50%.   Diffuse hypokinesis. Doppler parameters are consistent with   abnormal left ventricular relaxation (grade 1 diastolic   dysfunction). - Aortic valve: There was no stenosis. There was trivial   regurgitation. - Aorta: Borderline dilated aortic root. Aortic root dimension: 37   mm (ED). - Mitral valve: There was trivial regurgitation. - Left atrium: The atrium was mildly dilated. - Right ventricle: Poorly visualized. The cavity size was normal.   Systolic function was normal. - Right atrium: The atrium was mildly dilated. - Tricuspid valve: Peak RV-RA gradient (S): 26 mm Hg. - Pulmonary arteries: PA peak pressure: 29 mm Hg (S). - Inferior vena cava: The vessel was normal in size. The  respirophasic diameter changes were in the normal range (>= 50%),   consistent with normal central venous pressure.  Impressions:  - Technically difficult study with poor acoustic windows. Mildly   dilated LV with EF 45-50%, diffuse hypokinesis. Normal RV size   and systolic function. No significant valvular abnormalities. Past Medical History:  Diagnosis Date  . Benign localized prostatic hyperplasia with lower urinary tract symptoms (LUTS)    urologist-- dr Diona Fanti  . Chronic anticoagulation    Xarelto for afib  . Chronic constipation   . Chronic cystitis   . Gross hematuria   . History of DVT of lower extremity 06/08/2010   right  femoral dvt dx post op total hip 05-08-2010  . History of gastric ulcer    remote  . History of kidney stones    one stone. remote hx  . Hyperlipidemia   . Hypertension    Dx by Dr. Lance Muss around age  70   . NICM (nonischemic cardiomyopathy) ([place])    per echo 06-11-2017,  ef 45-50% with diffuse hypokinesis,  G1DD  . OSA on CPAP 2017   compliant with CPAP.  managed by Dr Elsworth Soho.  . Osteoarthritis   . Other urethral stricture, male, meatal    chronic;  pt self dilates  . Paroxysmal atrial fibrillation [facility]) cardiologist-- dr Rayann Heman   first dx 2009/   a. documented by Dr Barrett Shell on office EKG 9/12. b. maintained on Tikosyn and Xarelto. c. s/p DCCV 's.  d.  x3  EP w/ ablation atrial fib last one 12-05-2016  . Prediabetes   . Premature ventricular contraction   . PVC's (premature ventricular contractions)   . RA (rheumatoid arthritis) [facility])    rheumatologist--- Dr Page Spiro,  treated with Humira  . RBBB (right bundle branch block)    Hx of a fib, ablation Aug 2018  . Rheumatoid arthritis ([place])   . Vitamin D deficiency   . Wears hearing aid in both ears     Past Surgical History:  Procedure Laterality Date  . ABLATION OF DYSRHYTHMIC FOCUS  08/29/2015  . ATRIAL FIBRILLATION ABLATION  12/05/2016  . ATRIAL FIBRILLATION ABLATION N/A 12/05/2016   Procedure: Atrial Fibrillation Ablation;  Surgeon: [name], MD;  Location: [place] CV LAB;  Service: Cardiovascular;  Laterality: N/A;  . ATRIAL FLUTTER ABLATION  09/2010   by Greggory Brandy  . BUNIONECTOMY Right 1990s  . CARDIOVERSION N/A 10/28/2012   Procedure: CARDIOVERSION;  Surgeon: Burnell Blanks, MD;  Location: [place];  Service: Cardiovascular;  Laterality: N/A;  . CATARACT EXTRACTION W/ INTRAOCULAR LENS  IMPLANT, BILATERAL  2019  . ELECTROPHYSIOLOGIC STUDY N/A 08/29/2015   Procedure: Atrial Fibrillation Ablation;  Surgeon: [name], MD;  Location: [place] CV LAB;  Service: Cardiovascular;  Laterality: N/A;  . KNEE SURGERY  Bilateral x3   1960s & 1970s   open  . LEFT HEART CATHETERIZATION WITH CORONARY ANGIOGRAM N/A 06/07/2011   Procedure: LEFT HEART CATHETERIZATION WITH CORONARY ANGIOGRAM;  Surgeon: Peter M Martinique, MD;  Location: [facility] CATH LAB;  Service: Cardiovascular;  Laterality: N/A;    Normal coronary arteries,  low normal LVF  . TONSILLECTOMY AND ADENOIDECTOMY  age 41  . TOTAL HIP ARTHROPLASTY Right 06/04/10    dr Noemi Chapel  @[facility]   . TOTAL KNEE ARTHROPLASTY Bilateral right 1995;  left 1997  . TRANSURETHRAL RESECTION OF PROSTATE  06-29-2002   dr humphries  @[facility]   . TRANSURETHRAL RESECTION OF PROSTATE N/A 07/06/2018   Procedure: TRANSURETHRAL RESECTION OF THE PROSTATE (  TURP);  Surgeon: Franchot Gallo, MD;  Location: [facility];  Service: Urology;  Laterality: N/A;  45 MINS    MEDICATIONS: . acetaminophen (TYLENOL) 500 MG tablet  . atorvastatin (LIPITOR) 10 MG tablet  . Carboxymethylcellulose Sodium (THERATEARS OP)  . Cholecalciferol (VITAMIN D3) 5000 UNITS TABS  . clindamycin (CLEOCIN T) 1 % external solution  . Cranberry 1000 MG CAPS  . diclofenac sodium (VOLTAREN) 1 % GEL  . finasteride (PROSCAR) 5 MG tablet  . HUMIRA PEN 40 MG/0.4ML PNKT  . metoprolol succinate (TOPROL-XL) 50 MG 24 hr tablet  . metoprolol tartrate (LOPRESSOR) 25 MG tablet  . Multiple Vitamins-Minerals (PRESERVISION AREDS 2) CAPS  . Omega-3 Fatty Acids (OMEGA 3 PO)  . omeprazole (PRILOSEC) 20 MG capsule  . predniSONE (DELTASONE) 5 MG tablet  . senna (SENOKOT) 8.6 MG tablet  . vitamin B-12 (CYANOCOBALAMIN) 500 MCG tablet  . XARELTO 20 MG TABS tablet   No current facility-administered medications for this encounter.      Maia Plan [facility] Pre-Surgical Testing [phone] 09/02/18 3:04 PM

## 2018-09-03 ENCOUNTER — Ambulatory Visit (HOSPITAL_COMMUNITY)
Admission: RE | Admit: 2018-09-03 | Discharge: 2018-09-03 | Disposition: A | Discharge status: Home / Self Care | Payer: Medicare Other | Attending: Urology | Admitting: Urology

## 2018-09-03 ENCOUNTER — Ambulatory Visit (HOSPITAL_COMMUNITY): Payer: Medicare Other | Admitting: Certified Registered"

## 2018-09-03 ENCOUNTER — Other Ambulatory Visit: Payer: Self-pay

## 2018-09-03 ENCOUNTER — Encounter (HOSPITAL_COMMUNITY): Payer: Self-pay

## 2018-09-03 ENCOUNTER — Encounter (HOSPITAL_COMMUNITY)
Admission: RE | Disposition: A | Discharge status: Home / Self Care | Payer: Self-pay | Source: Home / Self Care | Attending: Urology

## 2018-09-03 ENCOUNTER — Observation Stay (HOSPITAL_COMMUNITY)
Admission: RE | Admit: 2018-09-03 | Discharge: 2018-09-03 | Disposition: A | Discharge status: Home / Self Care | Payer: Medicare Other | Source: Ambulatory Visit | Attending: Urology | Admitting: Urology

## 2018-09-03 ENCOUNTER — Ambulatory Visit (HOSPITAL_COMMUNITY): Payer: Medicare Other | Admitting: Physician Assistant

## 2018-09-03 ENCOUNTER — Ambulatory Visit (HOSPITAL_COMMUNITY)
Admission: RE | Admit: 2018-09-03 | Discharge: 2018-09-03 | Disposition: A | Discharge status: Home / Self Care | Payer: Medicare Other | Source: Ambulatory Visit | Attending: Urology | Admitting: Urology

## 2018-09-03 DIAGNOSIS — Z880 Allergy status to penicillin: Secondary | ICD-10-CM | POA: Insufficient documentation

## 2018-09-03 DIAGNOSIS — C775 Secondary and unspecified malignant neoplasm of intrapelvic lymph nodes: Secondary | ICD-10-CM | POA: Insufficient documentation

## 2018-09-03 DIAGNOSIS — C61 Malignant neoplasm of prostate: Secondary | ICD-10-CM

## 2018-09-03 DIAGNOSIS — Z87891 Personal history of nicotine dependence: Secondary | ICD-10-CM | POA: Insufficient documentation

## 2018-09-03 DIAGNOSIS — I10 Essential (primary) hypertension: Secondary | ICD-10-CM | POA: Insufficient documentation

## 2018-09-03 DIAGNOSIS — Z8711 Personal history of peptic ulcer disease: Secondary | ICD-10-CM | POA: Insufficient documentation

## 2018-09-03 DIAGNOSIS — E785 Hyperlipidemia, unspecified: Secondary | ICD-10-CM | POA: Insufficient documentation

## 2018-09-03 DIAGNOSIS — Z96653 Presence of artificial knee joint, bilateral: Secondary | ICD-10-CM | POA: Insufficient documentation

## 2018-09-03 DIAGNOSIS — R310 Gross hematuria: Secondary | ICD-10-CM | POA: Insufficient documentation

## 2018-09-03 DIAGNOSIS — Z836 Family history of other diseases of the respiratory system: Secondary | ICD-10-CM | POA: Insufficient documentation

## 2018-09-03 DIAGNOSIS — Z8249 Family history of ischemic heart disease and other diseases of the circulatory system: Secondary | ICD-10-CM | POA: Insufficient documentation

## 2018-09-03 DIAGNOSIS — R7303 Prediabetes: Secondary | ICD-10-CM | POA: Insufficient documentation

## 2018-09-03 DIAGNOSIS — Z7901 Long term (current) use of anticoagulants: Secondary | ICD-10-CM | POA: Diagnosis not present

## 2018-09-03 DIAGNOSIS — K5909 Other constipation: Secondary | ICD-10-CM | POA: Insufficient documentation

## 2018-09-03 DIAGNOSIS — Z8546 Personal history of malignant neoplasm of prostate: Secondary | ICD-10-CM | POA: Insufficient documentation

## 2018-09-03 DIAGNOSIS — Z96641 Presence of right artificial hip joint: Secondary | ICD-10-CM | POA: Insufficient documentation

## 2018-09-03 DIAGNOSIS — Z86718 Personal history of other venous thrombosis and embolism: Secondary | ICD-10-CM | POA: Insufficient documentation

## 2018-09-03 DIAGNOSIS — I428 Other cardiomyopathies: Secondary | ICD-10-CM | POA: Insufficient documentation

## 2018-09-03 DIAGNOSIS — Z9842 Cataract extraction status, left eye: Secondary | ICD-10-CM | POA: Insufficient documentation

## 2018-09-03 DIAGNOSIS — E559 Vitamin D deficiency, unspecified: Secondary | ICD-10-CM | POA: Insufficient documentation

## 2018-09-03 DIAGNOSIS — M069 Rheumatoid arthritis, unspecified: Secondary | ICD-10-CM | POA: Insufficient documentation

## 2018-09-03 DIAGNOSIS — Z87442 Personal history of urinary calculi: Secondary | ICD-10-CM | POA: Insufficient documentation

## 2018-09-03 DIAGNOSIS — N138 Other obstructive and reflux uropathy: Secondary | ICD-10-CM

## 2018-09-03 DIAGNOSIS — G4733 Obstructive sleep apnea (adult) (pediatric): Secondary | ICD-10-CM | POA: Insufficient documentation

## 2018-09-03 DIAGNOSIS — Z9841 Cataract extraction status, right eye: Secondary | ICD-10-CM | POA: Insufficient documentation

## 2018-09-03 DIAGNOSIS — I480 Paroxysmal atrial fibrillation: Secondary | ICD-10-CM | POA: Insufficient documentation

## 2018-09-03 DIAGNOSIS — D649 Anemia, unspecified: Secondary | ICD-10-CM | POA: Insufficient documentation

## 2018-09-03 DIAGNOSIS — N401 Enlarged prostate with lower urinary tract symptoms: Secondary | ICD-10-CM

## 2018-09-03 HISTORY — PX: CYSTOSCOPY WITH BIOPSY: SHX5122

## 2018-09-03 LAB — CBC WITH DIFFERENTIAL/PLATELET
Abs Immature Granulocytes: 0.03 10*3/uL (ref 0.00–0.07)
Basophils Absolute: 0 10*3/uL (ref 0.0–0.1)
Basophils Relative: 0 %
Eosinophils Absolute: 0 10*3/uL (ref 0.0–0.5)
Eosinophils Relative: 0 %
HCT: 35.4 % — ABNORMAL LOW (ref 39.0–52.0)
Hemoglobin: 11 g/dL — ABNORMAL LOW (ref 13.0–17.0)
Immature Granulocytes: 0 %
Lymphocytes Relative: 7 %
Lymphs Abs: 0.7 10*3/uL (ref 0.7–4.0)
MCH: 31.7 pg (ref 26.0–34.0)
MCHC: 31.1 g/dL (ref 30.0–36.0)
MCV: 102 fL — ABNORMAL HIGH (ref 80.0–100.0)
Monocytes Absolute: 0.4 10*3/uL (ref 0.1–1.0)
Monocytes Relative: 4 %
Neutro Abs: 8 10*3/uL — ABNORMAL HIGH (ref 1.7–7.7)
Neutrophils Relative %: 89 %
Platelets: 170 10*3/uL (ref 150–400)
RBC: 3.47 MIL/uL — ABNORMAL LOW (ref 4.22–5.81)
RDW: 14.1 % (ref 11.5–15.5)
WBC: 9.1 10*3/uL (ref 4.0–10.5)
nRBC: 0 % (ref 0.0–0.2)

## 2018-09-03 LAB — PROTIME-INR
INR: 0.9 (ref 0.8–1.2)
Prothrombin Time: 12.2 seconds (ref 11.4–15.2)

## 2018-09-03 SURGERY — CYSTOSCOPY, WITH BIOPSY
Anesthesia: General

## 2018-09-03 MED ORDER — ONDANSETRON HCL 4 MG/2ML IJ SOLN
INTRAMUSCULAR | Status: DC | PRN
  Administered 2018-09-03: 4 mg via INTRAVENOUS

## 2018-09-03 MED ORDER — MIDAZOLAM HCL 2 MG/2ML IJ SOLN
INTRAMUSCULAR | Status: AC
  Filled 2018-09-03: qty 4

## 2018-09-03 MED ORDER — CIPROFLOXACIN IN D5W 400 MG/200ML IV SOLN
400.0000 mg | INTRAVENOUS | Status: AC
  Administered 2018-09-03: 30 mg via INTRAVENOUS
  Filled 2018-09-03: qty 200

## 2018-09-03 MED ORDER — DEXAMETHASONE SODIUM PHOSPHATE 10 MG/ML IJ SOLN
INTRAMUSCULAR | Status: AC
  Filled 2018-09-03: qty 1

## 2018-09-03 MED ORDER — SUCCINYLCHOLINE CHLORIDE 200 MG/10ML IV SOSY
PREFILLED_SYRINGE | INTRAVENOUS | Status: DC | PRN
  Administered 2018-09-03: 160 mg via INTRAVENOUS

## 2018-09-03 MED ORDER — PHENYLEPHRINE 40 MCG/ML (10ML) SYRINGE FOR IV PUSH (FOR BLOOD PRESSURE SUPPORT)
PREFILLED_SYRINGE | INTRAVENOUS | Status: DC | PRN
  Administered 2018-09-03 (×6): 80 ug via INTRAVENOUS

## 2018-09-03 MED ORDER — SODIUM CHLORIDE 0.9 % IR SOLN
Status: DC | PRN
  Administered 2018-09-03 (×2): 3000 mL via INTRAVESICAL

## 2018-09-03 MED ORDER — LIDOCAINE 2% (20 MG/ML) 5 ML SYRINGE
INTRAMUSCULAR | Status: AC
  Filled 2018-09-03: qty 5

## 2018-09-03 MED ORDER — LACTATED RINGERS IV SOLN
INTRAVENOUS | Status: DC | PRN
  Administered 2018-09-03: 13:00:00 via INTRAVENOUS

## 2018-09-03 MED ORDER — SODIUM CHLORIDE 0.9 % IV SOLN
INTRAVENOUS | Status: DC
  Administered 2018-09-03: 10:00:00 via INTRAVENOUS

## 2018-09-03 MED ORDER — FENTANYL CITRATE (PF) 250 MCG/5ML IJ SOLN
INTRAMUSCULAR | Status: AC
  Filled 2018-09-03: qty 5

## 2018-09-03 MED ORDER — ONDANSETRON HCL 4 MG/2ML IJ SOLN: 4.0000 mg | Freq: Once | INTRAMUSCULAR | Status: DC | PRN

## 2018-09-03 MED ORDER — LIDOCAINE HCL (PF) 1 % IJ SOLN
INTRAMUSCULAR | Status: AC | PRN
  Administered 2018-09-03: 10 mL

## 2018-09-03 MED ORDER — PROPOFOL 10 MG/ML IV BOLUS
INTRAVENOUS | Status: AC
  Filled 2018-09-03: qty 20

## 2018-09-03 MED ORDER — FENTANYL CITRATE (PF) 100 MCG/2ML IJ SOLN
INTRAMUSCULAR | Status: AC
  Filled 2018-09-03: qty 2

## 2018-09-03 MED ORDER — ONDANSETRON HCL 4 MG/2ML IJ SOLN
INTRAMUSCULAR | Status: AC
  Filled 2018-09-03: qty 2

## 2018-09-03 MED ORDER — MIDAZOLAM HCL 2 MG/2ML IJ SOLN
INTRAMUSCULAR | Status: AC | PRN
  Administered 2018-09-03 (×4): 1 mg via INTRAVENOUS

## 2018-09-03 MED ORDER — PROPOFOL 10 MG/ML IV BOLUS
INTRAVENOUS | Status: DC | PRN
  Administered 2018-09-03: 20 mg via INTRAVENOUS

## 2018-09-03 MED ORDER — FENTANYL CITRATE (PF) 100 MCG/2ML IJ SOLN
INTRAMUSCULAR | Status: DC | PRN
  Administered 2018-09-03 (×2): 50 ug via INTRAVENOUS

## 2018-09-03 MED ORDER — FENTANYL CITRATE (PF) 100 MCG/2ML IJ SOLN: 25.0000 ug | INTRAMUSCULAR | Status: DC | PRN

## 2018-09-03 MED ORDER — LIDOCAINE HCL URETHRAL/MUCOSAL 2 % EX GEL
CUTANEOUS | Status: AC
  Filled 2018-09-03: qty 5

## 2018-09-03 MED ORDER — LIDOCAINE 2% (20 MG/ML) 5 ML SYRINGE
INTRAMUSCULAR | Status: DC | PRN
  Administered 2018-09-03: 60 mg via INTRAVENOUS

## 2018-09-03 MED ORDER — SUCCINYLCHOLINE CHLORIDE 200 MG/10ML IV SOSY
PREFILLED_SYRINGE | INTRAVENOUS | Status: AC
  Filled 2018-09-03: qty 10

## 2018-09-03 MED ORDER — FLUMAZENIL 0.5 MG/5ML IV SOLN
INTRAVENOUS | Status: AC
  Filled 2018-09-03: qty 5

## 2018-09-03 MED ORDER — EPHEDRINE SULFATE-NACL 50-0.9 MG/10ML-% IV SOSY
PREFILLED_SYRINGE | INTRAVENOUS | Status: DC | PRN
  Administered 2018-09-03 (×2): 5 mg via INTRAVENOUS

## 2018-09-03 MED ORDER — FENTANYL CITRATE (PF) 100 MCG/2ML IJ SOLN
INTRAMUSCULAR | Status: AC | PRN
  Administered 2018-09-03 (×2): 50 ug via INTRAVENOUS

## 2018-09-03 MED ORDER — BELLADONNA ALKALOIDS-OPIUM 16.2-30 MG RE SUPP
RECTAL | Status: AC
  Filled 2018-09-03: qty 1

## 2018-09-03 MED ORDER — NALOXONE HCL 0.4 MG/ML IJ SOLN
INTRAMUSCULAR | Status: AC
  Filled 2018-09-03: qty 1

## 2018-09-03 MED ORDER — DEXAMETHASONE SODIUM PHOSPHATE 10 MG/ML IJ SOLN
INTRAMUSCULAR | Status: DC | PRN
  Administered 2018-09-03: 5 mg via INTRAVENOUS

## 2018-09-03 SURGICAL SUPPLY — 25 items
BAG URINE DRAINAGE (UROLOGICAL SUPPLIES) ×2 IMPLANT
BAG URINE LEG 500ML (DRAIN) ×2 IMPLANT
BAG URO CATCHER STRL LF (MISCELLANEOUS) ×3 IMPLANT
CATH FOLEY 2WAY SLVR  5CC 20FR (CATHETERS) ×2
CATH FOLEY 2WAY SLVR 30CC 24FR (CATHETERS) IMPLANT
CATH FOLEY 2WAY SLVR 5CC 20FR (CATHETERS) IMPLANT
COVER WAND RF STERILE (DRAPES) IMPLANT
ELECT REM PT RETURN 15FT ADLT (MISCELLANEOUS) ×3 IMPLANT
EVACUATOR MICROVAS BLADDER (UROLOGICAL SUPPLIES) IMPLANT
GLOVE BIOGEL M 8.0 STRL (GLOVE) ×3 IMPLANT
GOWN STRL REUS W/ TWL XL LVL3 (GOWN DISPOSABLE) ×1 IMPLANT
GOWN STRL REUS W/TWL XL LVL3 (GOWN DISPOSABLE) ×5 IMPLANT
KIT TURNOVER KIT A (KITS) IMPLANT
LOOP CUT BIPOLAR 24F LRG (ELECTROSURGICAL) ×2 IMPLANT
LOOP MONOPOLAR YLW (ELECTROSURGICAL) IMPLANT
MANIFOLD NEPTUNE II (INSTRUMENTS) ×3 IMPLANT
NDL SAFETY ECLIPSE 18X1.5 (NEEDLE) ×1 IMPLANT
NEEDLE HYPO 18GX1.5 SHARP (NEEDLE) ×2
PACK CYSTO (CUSTOM PROCEDURE TRAY) ×3 IMPLANT
SET ASPIRATION TUBING (TUBING) IMPLANT
SYRINGE IRR TOOMEY STRL 70CC (SYRINGE) IMPLANT
TUBING CONNECTING 10 (TUBING) ×2 IMPLANT
TUBING CONNECTING 10' (TUBING) ×1
TUBING UROLOGY SET (TUBING) ×3 IMPLANT
WATER STERILE IRR 3000ML UROMA (IV SOLUTION) ×3 IMPLANT

## 2018-09-03 NOTE — H&P (Signed)
H&P  Chief Complaint: Persistent hematuria/bladder abnormality  History of Present Illness: Elderly male w/ new dx of PCa found incidentally on TURP 3.2.2020. GS 4+4 path, fairly high volume I resected chips. CT following TURP revealed solitary enlarged right pelvic node as well as rt posterior bladder neck lesion not seen @ time of TURP. He recently presented again following TURP for persistent hematuria (he is on Xarelto). When that med stopped hematuria clears. Cysto in office revealed 2-3 nodules near rt bladder neck possible contiguous with prostatic resection. HE presents today for image-directed LN bx by IR as well as repeat cysto/fulguration of prostatic fossa, possible bx of bladder lesion(s).  Past Medical History:  Diagnosis Date  . Benign localized prostatic hyperplasia with lower urinary tract symptoms (LUTS)    urologist-- dr Diona Fanti  . Chronic anticoagulation    Xarelto for afib  . Chronic constipation   . Chronic cystitis   . Gross hematuria   . History of DVT of lower extremity 06/08/2010   right femoral dvt dx post op total hip 05-08-2010  . History of gastric ulcer    remote  . History of kidney stones    one stone. remote hx  . Hyperlipidemia   . Hypertension    Dx by Dr. Lance Muss around age  59   . NICM (nonischemic cardiomyopathy) ([place])    per echo 06-11-2017,  ef 45-50% with diffuse hypokinesis,  G1DD  . OSA on CPAP 2017   compliant with CPAP.  managed by Dr Elsworth Soho.  . Osteoarthritis   . Other urethral stricture, male, meatal    chronic;  pt self dilates  . Paroxysmal atrial fibrillation [facility]) cardiologist-- dr Rayann Heman   first dx 2009/   a. documented by Dr Barrett Shell on office EKG 9/12. b. maintained on Tikosyn and Xarelto. c. s/p DCCV 's.  d.  x3  EP w/ ablation atrial fib last one 12-05-2016  . Prediabetes   . Premature ventricular contraction   . PVC's (premature ventricular contractions)   . RA (rheumatoid arthritis) [facility])    rheumatologist--- Dr Page Spiro,  treated with Humira  . RBBB (right bundle branch block)    Hx of a fib, ablation Aug 2018  . Rheumatoid arthritis ([place])   . Vitamin D deficiency   . Wears hearing aid in both ears     Past Surgical History:  Procedure Laterality Date  . ABLATION OF DYSRHYTHMIC FOCUS  08/29/2015  . ATRIAL FIBRILLATION ABLATION  12/05/2016  . ATRIAL FIBRILLATION ABLATION N/A 12/05/2016   Procedure: Atrial Fibrillation Ablation;  Surgeon: [name], MD;  Location: [place] CV LAB;  Service: Cardiovascular;  Laterality: N/A;  . ATRIAL FLUTTER ABLATION  09/2010   by Greggory Brandy  . BUNIONECTOMY Right 1990s  . CARDIOVERSION N/A 10/28/2012   Procedure: CARDIOVERSION;  Surgeon: Burnell Blanks, MD;  Location: [place];  Service: Cardiovascular;  Laterality: N/A;  . CATARACT EXTRACTION W/ INTRAOCULAR LENS  IMPLANT, BILATERAL  2019  . ELECTROPHYSIOLOGIC STUDY N/A 08/29/2015   Procedure: Atrial Fibrillation Ablation;  Surgeon: [name], MD;  Location: [place] CV LAB;  Service: Cardiovascular;  Laterality: N/A;  . KNEE SURGERY Bilateral x3   1960s & 1970s   open  . LEFT HEART CATHETERIZATION WITH CORONARY ANGIOGRAM N/A 06/07/2011   Procedure: LEFT HEART CATHETERIZATION WITH CORONARY ANGIOGRAM;  Surgeon: Peter M Martinique, MD;  Location: [facility] CATH LAB;  Service: Cardiovascular;  Laterality: N/A;    Normal coronary arteries,  low normal LVF  .  TONSILLECTOMY AND ADENOIDECTOMY  age 33  . TOTAL HIP ARTHROPLASTY Right 06/04/10    dr Noemi Chapel  @[facility]   . TOTAL KNEE ARTHROPLASTY Bilateral right 1995;  left 1997  . TRANSURETHRAL RESECTION OF PROSTATE  06-29-2002   dr humphries  @[facility]   . TRANSURETHRAL RESECTION OF PROSTATE N/A 07/06/2018   Procedure: TRANSURETHRAL RESECTION OF THE PROSTATE (TURP);  Surgeon: Franchot Gallo, MD;  Location: [facility];  Service: Urology;  Laterality: N/A;  45 MINS    Home Medications:  Allergies as of 09/03/2018      Reactions   Penicillins Itching, Swelling, Other (See  Comments)   Did it involve swelling of the face/tongue/throat, SOB, or low BP? No Did it involve sudden or severe rash/hives, skin peeling, or any reaction on the inside of your mouth or nose? No Did you need to seek medical attention at a hospital or doctor's office? No When did it last happen?30 + years If all above answers are "NO", may proceed with cephalosporin use.      Medication List    Notice   Cannot display discharge medications because the patient has not yet been admitted.     Allergies:  Allergies  Allergen Reactions  . Penicillins Itching, Swelling and Other (See Comments)    Did it involve swelling of the face/tongue/throat, SOB, or low BP? No Did it involve sudden or severe rash/hives, skin peeling, or any reaction on the inside of your mouth or nose? No Did you need to seek medical attention at a hospital or doctor's office? No When did it last happen?30 + years If all above answers are "NO", may proceed with cephalosporin use.      Family History  Problem Relation Age of Onset  . Heart attack Father   . Lung disease Mother   . Arrhythmia Sister   . Atrial fibrillation Brother   . Diabetes Neg Hx     Social History:  reports that he quit smoking about 40 years ago. His smoking use included cigarettes. He has a 30.00 pack-year smoking history. He has never used smokeless tobacco. He reports current alcohol use. He reports that he does not use drugs.  ROS: A complete review of systems was performed.  All systems are negative except for pertinent findings as noted.  Physical Exam:  Vital signs in last 24 hours:   Constitutional:  Alert and oriented, No acute distress Cardiovascular: Regular rate  Respiratory: Normal respiratory effort GI: Abdomen is soft, nontender, nondistended, no abdominal masses. No CVAT.  Genitourinary: Normal male phallus, testes are descended bilaterally and non-tender and without masses, scrotum is normal in  appearance without lesions or masses, perineum is normal on inspection. Lymphatic: No lymphadenopathy Neurologic: Grossly intact, no focal deficits Psychiatric: Normal mood and affect  Laboratory Data:  Recent Labs    09/01/18 1030  WBC 11.9*  HGB 11.4*  HCT 36.8*  PLT 179    Recent Labs    09/01/18 1030  NA 141  K 4.1  CL 107  GLUCOSE 145*  BUN 13  CALCIUM 8.7*  CREATININE 0.54*     No results found for this or any previous visit (from the past 24 hour(s)). No results found for this or any previous visit (from the past 240 hour(s)).  Renal Function: Recent Labs    09/01/18 1030  CREATININE 0.54*   Estimated Creatinine Clearance: 88.1 mL/min (A) (by C-G formula based on SCr of 0.54 mg/dL (L)).  Radiologic Imaging: No results found.  Impression/Assessment:  1. Rt pelvic adenopathy w/ new dx of high risk PCa  2. Persistent hematuria following TURP~7 weeks ago w/ small bladder lesions  Plan:  1. Image directed LN bx by IR  2. Cysto, fulguration of prostatic fossa, possible bladder bx

## 2018-09-03 NOTE — Procedures (Signed)
Interventional Radiology Procedure Note  Procedure: CT guided biopsy right external iliac chain LN  Complications: None  Estimated Blood Loss: None  Recommendations: - DC home   Signed,  Criselda Peaches, MD

## 2018-09-03 NOTE — Transfer of Care (Signed)
Immediate Anesthesia Transfer of Care Note  Patient: Connor [name]  Procedure(s) Performed: CYSTOSCOPY WITH FULGURATION OFURETHRA (N/A )  Patient Location: PACU  Anesthesia Type:General  Level of Consciousness: awake and responds to stimulation  Airway & Oxygen Therapy: Patient Spontanous Breathing and Patient connected to face mask  Post-op Assessment: Report given to RN and Post -op Vital signs reviewed and stable  Post vital signs: Reviewed and stable  Last Vitals:  Vitals Value Taken Time  BP 136/88 09/03/2018  1:23 PM  Temp 36.4 C 09/03/2018  1:23 PM  Pulse 79 09/03/2018  1:26 PM  Resp 21 09/03/2018  1:26 PM  SpO2 100 % 09/03/2018  1:26 PM  Vitals shown include unvalidated device data.  Last Pain:  Vitals:   09/03/18 1220  PainSc: 0-No pain         Complications: No apparent anesthesia complications

## 2018-09-03 NOTE — H&P (Signed)
Chief Complaint: Patient was seen in consultation today for lymphadenopathy  Referring Physician(s): Wade Hampton  Supervising Physician: Jacqulynn Cadet  Patient Status: [facility] - Out-pt  History of Present Illness: Connor [name] is a 85 y.o. male with past medical history of history of DVT and a fib on Xarelto at home and recent diagnosis of prostate cancer presents to [facility] for lymph node biopsy at the request of Dr. Zannie Cove. Patient is also scheduled for cystoscopy with Dr. Diona Fanti today due to gross hematuria.  Patient presents today in his usual state of health. He denies new complaints.  Denies fever, chills, shortness of breath, abdominal pain, nausea, or vomiting.  He held his Xarelto appropriately.  He was not bridged with lovenox.  Past Medical History:  Diagnosis Date  . Benign localized prostatic hyperplasia with lower urinary tract symptoms (LUTS)    urologist-- dr Diona Fanti  . Chronic anticoagulation    Xarelto for afib  . Chronic constipation   . Chronic cystitis   . Gross hematuria   . History of DVT of lower extremity 06/08/2010   right femoral dvt dx post op total hip 05-08-2010  . History of gastric ulcer    remote  . History of kidney stones    one stone. remote hx  . Hyperlipidemia   . Hypertension    Dx by Dr. Lance Muss around age  70   . NICM (nonischemic cardiomyopathy) ([place])    per echo 06-11-2017,  ef 45-50% with diffuse hypokinesis,  G1DD  . OSA on CPAP 2017   compliant with CPAP.  managed by Dr Elsworth Soho.  . Osteoarthritis   . Other urethral stricture, male, meatal    chronic;  pt self dilates  . Paroxysmal atrial fibrillation [facility]) cardiologist-- dr Rayann Heman   first dx 2009/   a. documented by Dr Barrett Shell on office EKG 9/12. b. maintained on Tikosyn and Xarelto. c. s/p DCCV 's.  d.  x3  EP w/ ablation atrial fib last one 12-05-2016  . Prediabetes   . Premature ventricular contraction   . PVC's (premature ventricular  contractions)   . RA (rheumatoid arthritis) [facility])    rheumatologist--- Dr Page Spiro,  treated with Humira  . RBBB (right bundle branch block)    Hx of a fib, ablation Aug 2018  . Rheumatoid arthritis ([place])   . Vitamin D deficiency   . Wears hearing aid in both ears     Past Surgical History:  Procedure Laterality Date  . ABLATION OF DYSRHYTHMIC FOCUS  08/29/2015  . ATRIAL FIBRILLATION ABLATION  12/05/2016  . ATRIAL FIBRILLATION ABLATION N/A 12/05/2016   Procedure: Atrial Fibrillation Ablation;  Surgeon: [name], MD;  Location: [facility] [facility];  Service: Cardiovascular;  Laterality: N/A;  . ATRIAL FLUTTER ABLATION  09/2010   by Greggory Brandy  . BUNIONECTOMY Right 1990s  . CARDIOVERSION N/A 10/28/2012   Procedure: CARDIOVERSION;  Surgeon: Burnell Blanks, MD;  Location: [place];  Service: Cardiovascular;  Laterality: N/A;  . CATARACT EXTRACTION W/ INTRAOCULAR LENS  IMPLANT, BILATERAL  2019  . ELECTROPHYSIOLOGIC STUDY N/A 08/29/2015   Procedure: Atrial Fibrillation Ablation;  Surgeon: [name], MD;  Location: [place] [facility];  Service: Cardiovascular;  Laterality: N/A;  . KNEE SURGERY Bilateral x3   1960s & 1970s   open  . LEFT HEART CATHETERIZATION WITH CORONARY ANGIOGRAM N/A 06/07/2011   Procedure: LEFT HEART CATHETERIZATION WITH CORONARY ANGIOGRAM;  Surgeon: Peter M Martinique, MD;  Location: [facility];  Service:  Cardiovascular;  Laterality: N/A;    Normal coronary arteries,  low normal LVF  . TONSILLECTOMY AND ADENOIDECTOMY  age 83  . TOTAL HIP ARTHROPLASTY Right 06/04/10    dr Noemi Chapel  @[facility]   . TOTAL KNEE ARTHROPLASTY Bilateral right 1995;  left 1997  . TRANSURETHRAL RESECTION OF PROSTATE  06-29-2002   dr humphries  @[facility]   . TRANSURETHRAL RESECTION OF PROSTATE N/A 07/06/2018   Procedure: TRANSURETHRAL RESECTION OF THE PROSTATE (TURP);  Surgeon: Franchot Gallo, MD;  Location: [facility];  Service: Urology;  Laterality: N/A;  71 MINS    Allergies: Penicillins   Medications: Prior to Admission medications   Medication Sig Start Date End Date Taking? Authorizing Provider  acetaminophen (TYLENOL) 500 MG tablet Take 1,000-1,500 mg by mouth 2 (two) times daily as needed for moderate pain.    Yes [provider]  atorvastatin (LIPITOR) 10 MG tablet Take 10 mg by mouth daily.   Yes [provider]  Carboxymethylcellulose Sodium (THERATEARS OP) Apply 1 drop to eye daily as needed (dry eyes).   Yes [provider]  Cholecalciferol (VITAMIN D3) 5000 UNITS TABS Take 5,000 Units by mouth See admin instructions. Take 5000 units by mouth every 4 days   Yes [provider]  clindamycin (CLEOCIN T) 1 % external solution Apply 1 application topically daily as needed (for bumps in scalp). Apply to scalp 06/23/14  Yes [provider]  finasteride (PROSCAR) 5 MG tablet Take 5 mg by mouth every morning.  03/11/18  Yes [provider]  metoprolol succinate (TOPROL-XL) 50 MG 24 hr tablet Take 25-50 mg by mouth See admin instructions. Take 25 mg in the morning and 50 mg in the evening   Yes [provider]  metoprolol tartrate (LOPRESSOR) 25 MG tablet TAKE 1 TABLET BY MOUTH EVERY 6 HOURS AS NEEDED FOR FAST HEART RATES Patient taking differently: Take 25 mg by mouth every 6 (six) hours as needed (heart rate over 100).  06/18/18  Yes Allred, Jeneen Rinks, MD  Omega-3 Fatty Acids (OMEGA 3 PO) Take 3 capsules by mouth every evening.    Yes [provider]  omeprazole (PRILOSEC) 20 MG capsule Take 20 mg by mouth daily as needed (acid reflux).    Yes Darlin Coco, MD  predniSONE (DELTASONE) 5 MG tablet Take 15 mg by mouth daily.   Yes [provider]  senna (SENOKOT) 8.6 MG tablet Take 2 tablets by mouth every evening.    Yes [provider]  vitamin B-12 (CYANOCOBALAMIN) 500 MCG tablet Take 500 mcg by mouth every evening.    Yes [provider]  Cranberry 1000 MG CAPS Take 3,000 mg by mouth  every evening.     [provider]  diclofenac sodium (VOLTAREN) 1 % GEL APPLY 2 G TOPICALLY 2 (TWO) TIMES DAILY. Patient not taking: Reported on 08/31/2018 01/09/18   Elayne Snare, MD  HUMIRA PEN 40 MG/0.4ML PNKT Inject 40 mg as directed every 14 (fourteen) days.  03/26/18   [provider]  Multiple Vitamins-Minerals (PRESERVISION AREDS 2) CAPS Take 1 capsule by mouth 2 (two) times a day.    [provider]  XARELTO 20 MG TABS tablet TAKE 1 TABLET BY MOUTH EVERY DAY Patient taking differently: Take 20 mg by mouth every evening.  11/12/17   [name], MD     Family History  Problem Relation Age of Onset  . Heart attack Father   . Lung disease Mother   . Arrhythmia Sister   .  Atrial fibrillation Brother   . Diabetes Neg Hx     Social History   Socioeconomic History  . Marital status: Married    Spouse name: Not on file  . Number of children: Not on file  . Years of education: Not on file  . Highest education level: Not on file  Occupational History  . Not on file  Social Needs  . Financial resource strain: Not on file  . Food insecurity:    Worry: Not on file    Inability: Not on file  . Transportation needs:    Medical: Not on file    Non-medical: Not on file  Tobacco Use  . Smoking status: Former Smoker    Packs/day: 1.00    Years: 30.00    Pack years: 30.00    Types: Cigarettes    Last attempt to quit: 05/06/1978    Years since quitting: 40.3  . Smokeless tobacco: Never Used  Substance and Sexual Activity  . Alcohol use: Yes    Comment: occasionally wine and beer  . Drug use: Never  . Sexual activity: Not on file  Lifestyle  . Physical activity:    Days per week: Not on file    Minutes per session: Not on file  . Stress: Not on file  Relationships  . Social connections:    Talks on phone: Not on file    Gets together: Not on file    Attends religious service: Not on file    Active member of club or organization: Not on file     Attends meetings of clubs or organizations: Not on file    Relationship status: Not on file  Other Topics Concern  . Not on file  Social History Narrative   he patient lives in [place] with his spouse.  Retired.     Review of Systems: A 12 point ROS discussed and pertinent positives are indicated in the HPI above.  All other systems are negative.  Review of Systems  Constitutional: Negative for fatigue and fever.  Respiratory: Negative for cough and shortness of breath.   Cardiovascular: Negative for chest pain.  Gastrointestinal: Negative for abdominal pain, nausea and vomiting.  Genitourinary: Positive for hematuria.  Musculoskeletal: Negative for back pain.  Psychiatric/Behavioral: Negative for behavioral problems and confusion.    Vital Signs: BP 135/81   Pulse 88   Temp (!) 97.2 F (36.2 C) (Oral)   Resp 18   SpO2 96%   Physical Exam Vitals signs and nursing note reviewed.  Cardiovascular:     Rate and Rhythm: Normal rate and regular rhythm.  Pulmonary:     Effort: Pulmonary effort is normal. No respiratory distress.     Breath sounds: Normal breath sounds.  Musculoskeletal: Normal range of motion.  Skin:    General: Skin is warm and dry.  Neurological:     General: No focal deficit present.     Mental Status: He is alert and oriented to person, place, and time.  Psychiatric:        Mood and Affect: Mood normal.        Behavior: Behavior normal.        Thought Content: Thought content normal.        Judgment: Judgment normal.      MD Evaluation Airway: WNL Heart: WNL Abdomen: WNL Chest/ Lungs: WNL ASA  Classification: 3 Mallampati/Airway Score: One   Imaging: Nm Bone Scan Whole Body  Result Date: 08/18/2018 CLINICAL DATA:  Prostate cancer. Evaluate  for bony metastatic disease. EXAM: NUCLEAR MEDICINE WHOLE BODY BONE SCAN TECHNIQUE: Whole body anterior and posterior images were obtained approximately 3 hours after intravenous injection of  radiopharmaceutical. RADIOPHARMACEUTICALS:  21.8 mCi Technetium-77m MDP IV COMPARISON:  None. FINDINGS: Bilateral knee replacements. Degenerative changes in the feet, left hip, left greater than right sternal clavicular joint, and AC joints. Degenerative changes in the spine, particularly the lumbar spine but also involving the cervical and thoracic spine. The patient is status post right hip replacement. No other bony or soft tissue abnormalities. IMPRESSION: No scintigraphic evidence of bony metastatic disease. Right hip replacement and bilateral knee replacements. Scattered degenerative changes. Electronically Signed   By: Dorise Bullion [name] M.D   On: 08/18/2018 15:15    Labs:  CBC: Recent Labs    11/17/17 0911 07/06/18 0755 09/01/18 1030 09/03/18 0955  WBC 4.9  --  11.9* 9.1  HGB 13.9 12.6* 11.4* 11.0*  HCT 41.5 37.0* 36.8* 35.4*  PLT 115.0 Repeated and verified X2.*  --  179 170    COAGS: Recent Labs    09/03/18 0955  INR 0.9    BMP: Recent Labs    10/14/17 1128 11/17/17 0911 01/16/18 0921 07/06/18 0755 09/01/18 1030  NA 145* 142 143 138 141  K 4.3 4.0 4.0 3.9 4.1  CL 106 108 105  --  107  CO2 24 27 24   --  28  GLUCOSE 92 112* 97 127* 145*  BUN 16 18 18   --  13  CALCIUM 8.6 8.8 8.7  --  8.7*  CREATININE 0.72* 0.77 0.77  --  0.54*  GFRNONAA 87  --  85  --  >60  GFRAA 101  --  98  --  >60    LIVER FUNCTION TESTS: Recent Labs    11/17/17 0911  BILITOT 1.2  AST 21  ALT 17  ALKPHOS 54  PROT 6.5  ALBUMIN 3.9    TUMOR MARKERS: No results for input(s): AFPTM, CEA, CA199, CHROMGRNA in the last 8760 hours.  Assessment and Plan: Lymphadenopathy Patient with past medical history of prostate cancer presents with complaint of lymphadenopathy.  IR consulted for biopsy at the request of Dr. Diona Fanti. Case reviewed by Dr. Kathlene Cote who approves patient for procedure.  Patient presents today in their usual state of health.  He has been NPO and is not currently on  blood thinners as he had held his Xarelto x1 day. States he has had several runs of a fib in the past week, none today.  On physical exam today he is in NSR.    Risks and benefits of biopsy was discussed with the patient and/or patient's family including, but not limited to bleeding, infection, damage to adjacent structures or low yield requiring additional tests.  All of the questions were answered and there is agreement to proceed.  Consent signed and in chart.  Thank you for this interesting consult.  I greatly enjoyed meeting LEDFORD GOODSON [name] and look forward to participating in their care.  A copy of this report was sent to the requesting provider on this date.  Electronically Signed: Docia Barrier, PA 09/03/2018, 10:44 AM   I spent a total of  30 Minutes   in face to face in clinical consultation, greater than 50% of which was counseling/coordinating care for lymphadenopathy.

## 2018-09-03 NOTE — Op Note (Signed)
Preoperative diagnosis: Recurrent hematuria following TURP, question of bladder lesion  Postoperative diagnosis: Hematuria most likely from resected prostate, no evidence of bladder lesion  Principal procedure: Cystoscopy, fulguration of prostatic urethra  Surgeon: Elianis Fischbach  Anesthesia: General  Complications: None  Drains: 81 French Foley catheter  Specimen: None  Indications: 85 year old male status post TURP on July 06, 2018.  He has had recurrent gross hematuria which typically clears after he stops his Xarelto.  However, this is been persistent over the past couple of months.  Recent CT scan performed after he had incidental findings of prostate cancer, high-grade, and his prostate chips revealed a polypoid lesion at the bladder neck as well as one large pelvic lymph node.  He presented today earlier for interventional radiology to biopsy this percutaneously.  He presents here for cystoscopy, fulguration of bleeding from his prostatic urethra as well as possible prostate biopsy  Findings: Mild narrowing of the pendulous urethra.  Easily passed with the 26 French resectoscope.  Prostatic urethra was well resected but did have friable tissue consistent with recent TURP.  Bladder was trabeculated, but there were no lesions noted in the area showing up as abnormal on the CT scan.  Description of procedure: The patient was properly identified in the holding area.  He received preoperative Cipro.  He was taken to the operating room where general anesthetic was administered.  He was placed in the dorsolithotomy position.  Genitalia and perineum were prepped and draped.  Proper timeout was performed.  Urethral meatus dilated to 32 [place] with [employer] sounds.  At this point, the resectoscope sheath was passed using the visual obturator.  Careful inspection of the entire bladder and bladder neck revealed the above-mentioned findings but no evidence of bladder lesions.  At this point, the obturator  was removed and the resectoscope was placed with the cutting loop.  The entire prostatic urethra was cauterized.  There was minimal nodularity which may well have been recent growth, especially on the right prostatic urethra.  However, this was nonobstructive and was not resected.  Following complete fulguration of the urethra, the scope was then removed.  A 20 French Foley catheter was placed, the balloon filled with 10 cc of water and this was hooked to bag drainage.  The patient tolerated procedure well.  He was awakened and taken to the PACU in stable condition.  The patient's wife was called remotely with the results of the procedure.

## 2018-09-03 NOTE — Interval H&P Note (Signed)
History and Physical Interval Note:  09/03/2018 12:31 PM  Connor [name]  has presented today for surgery, with the diagnosis of PROSTATE CANCER, GROSS HEMATURIA.  The various methods of treatment have been discussed with the patient and family. After consideration of risks, benefits and other options for treatment, the patient has consented to  Procedure(s) with comments: CYSTOSCOPY WITH BIOPSY (N/A) - 30 MINS as a surgical intervention.  The patient's history has been reviewed, patient examined, no change in status, stable for surgery.  I have reviewed the patient's chart and labs.  Questions were answered to the patient's satisfaction.     Lillette Boxer Ayra Hodgdon

## 2018-09-03 NOTE — Anesthesia Postprocedure Evaluation (Signed)
Anesthesia Post Note  Patient: Connor [name]  Procedure(s) Performed: CYSTOSCOPY WITH FULGURATION OFURETHRA (N/A )     Patient location during evaluation: PACU Anesthesia Type: General Level of consciousness: awake and alert Pain management: pain level controlled Vital Signs Assessment: post-procedure vital signs reviewed and stable Respiratory status: spontaneous breathing, nonlabored ventilation, respiratory function stable and patient connected to nasal cannula oxygen Cardiovascular status: blood pressure returned to baseline and stable Postop Assessment: no apparent nausea or vomiting Anesthetic complications: no    Last Vitals:  Vitals:   09/03/18 1345 09/03/18 1400  BP: (!) 145/92 139/87  Pulse: 85 91  Resp: 20 16  Temp: (!) 36.3 C (!) 36.4 C  SpO2: 98% 98%    Last Pain:  Vitals:   09/03/18 1400  PainSc: 0-No pain                 Tiajuana Amass

## 2018-09-03 NOTE — Anesthesia Procedure Notes (Signed)
Procedure Name: Intubation Date/Time: 09/03/2018 12:42 PM Performed by: Silas Sacramento, CRNA Pre-anesthesia Checklist: Patient identified, Emergency Drugs available, Suction available and Patient being monitored Patient Re-evaluated:Patient Re-evaluated prior to induction Oxygen Delivery Method: Circle system utilized Preoxygenation: Pre-oxygenation with 100% oxygen Induction Type: IV induction, Rapid sequence and Cricoid Pressure applied Laryngoscope Size: Mac and 4 Grade View: Grade I Tube type: Oral Tube size: 7.5 mm Number of attempts: 1 Airway Equipment and Method: Stylet Placement Confirmation: ETT inserted through vocal cords under direct vision,  positive ETCO2 and breath sounds checked- equal and bilateral Secured at: 24 cm Tube secured with: Tape Dental Injury: Teeth and Oropharynx as per pre-operative assessment

## 2018-09-03 NOTE — Discharge Instructions (Signed)
1. You may see some blood in the urine and may have some burning with urination for 48-72 hours. You also may notice that you have to urinate more frequently or urgently after your procedure which is normal.  2. You should call should you develop an inability urinate, fever > 101, persistent nausea and vomiting that prevents you from eating or drinking to stay hydrated.  3. If you have a catheter, you will be taught how to take care of the catheter by the nursing staff prior to discharge from the hospital.  You may periodically feel a strong urge to void with the catheter in place.  This is a bladder spasm and most often can occur when having a bowel movement or moving around. It is typically self-limited and usually will stop after a few minutes.  You may use some Vaseline or Neosporin around the tip of the catheter to reduce friction at the tip of the penis. You may also see some blood in the urine.  A very small amount of blood can make the urine look quite red.  As long as the catheter is draining well, there usually is not a problem.  However, if the catheter is not draining well and is bloody, you should call the office [phone]) to notify us. 4. It is okay to remove the catheter as instructed on Saturday morning if the urine is clear.

## 2018-09-04 ENCOUNTER — Encounter (HOSPITAL_COMMUNITY): Payer: Self-pay | Admitting: Urology

## 2018-09-10 ENCOUNTER — Telehealth: Payer: Self-pay | Admitting: Medical Oncology

## 2018-09-10 ENCOUNTER — Encounter: Payer: Self-pay | Admitting: Medical Oncology

## 2018-09-10 NOTE — Telephone Encounter (Signed)
I called pt to introduce myself as the Prostate Nurse Navigator and the coordinator of the [facility]state [place]. I confirmed his referral to clinic for 09/11/18. I discussed the clinic will be held via WebEx visit due to the COVID-19 restrictions. I discussed the clinic format and the physicians he will see.   Together we did a trial WebEx and we were able to connect. I will call the patient after conference to let him know we are ready to begin the WebEx. He voiced understanding of the above.

## 2018-09-11 ENCOUNTER — Encounter: Payer: Self-pay | Admitting: Medical Oncology

## 2018-09-11 ENCOUNTER — Ambulatory Visit
Admission: RE | Admit: 2018-09-11 | Discharge: 2018-09-11 | Disposition: A | Discharge status: Home / Self Care | Payer: Medicare Other | Source: Ambulatory Visit | Attending: Radiation Oncology | Admitting: Radiation Oncology

## 2018-09-11 ENCOUNTER — Encounter: Payer: Self-pay | Admitting: Radiation Oncology

## 2018-09-11 ENCOUNTER — Inpatient Hospital Stay: Payer: Medicare Other | Attending: Oncology | Admitting: Oncology

## 2018-09-11 DIAGNOSIS — C61 Malignant neoplasm of prostate: Secondary | ICD-10-CM | POA: Insufficient documentation

## 2018-09-11 NOTE — Consult Note (Signed)
Telehealth Visit     09/11/2018   --------------------------------------------------------------------------------   Connor [name]  MRN: [id number]  PRIMARY CARE:  Elayne Snare, MD  DOB: [birth year], 85 year old Male  REFERRING:  Lillette Boxer. Diona Fanti, MD  SSN: -[id number]  PROVIDER:  Franchot Gallo, M.D.    TREATING:  Raynelle Bring, M.D.    LOCATION:  [facility] [phone]   --------------------------------------------------------------------------------   CC/HPI: CC: Prostate Cancer   Physician requesting consult: Dr. Franchot Gallo  PCP: Dr. Elayne Snare  TELEHEALTH visit with Prostate Cancer [facility]   Connor [name] is an 85 year old gentleman with a history of atrial fibrillation on Xarelto. Connor [name] has a history of BPH s/p TURP in 2004. His PSA in 2017 was 2.44 and 1.87 in 2018. Connor [name] developed worsening LUTS and gross hematuria with urinary retention in early 2020. This led to a TURP on 07/06/18 with 9 g of prostate tissue removed. 10-15% of the tissue was positive for Gleason 4+4=8 adenocarcinoma. His PSA post-TURP was 2.6. Connor [name] underwent staging studies including a bone scan and CT scan of the abdomen and pelvis on 08/18/18. His bone scan was negative but his CT scan demonstrated a 2.9 cm right external iliac lymph node. In addition, multiple tiny sclerotic lesions were noted on his CT scan that were stable compared to 2018 and again without uptake on his bone scan to suggest metastatic disease. Connor [name] underwent a CT guided biopsy of his right external iliac lymph node on 09/03/18 that confirmed metastatic Gleason 4+4=8 adenocarcinoma.   Family history: None.   Imaging studies:  Bone scan (08/18/18) and CT abdomen/pelvis (08/18/18)   PMH: Connor [name] has a history of atrial fibrillation.  PSH: No abdominal surgeries.   TNM stage: cT1b N1 M0  PSA: 2.6  Gleason score: 4+4=8   Urinary function: Connor [name] is on finasteride. Currently, Connor [name] is voiding well. Connor [name] still has some intermittent  hematuria.     ALLERGIES: Penicillins    MEDICATIONS: Finasteride 5 mg tablet 1 tablet PO Daily  Daily Multivitamin tablet Oral  Humira  ICaps CAPS Oral  Lipitor 10 mg tablet Oral  Metoprolol Tartrate 50 mg tablet Oral  Omega 3 CAPS Oral  Omeprazole 20 mg tablet, delayed release 0 Oral  Prednisone  Sildenafil Citrate 20 mg tablet 1-5 tabs as needed  Tylenol  Vitamin B12  Vitamin D3  Xarelto 20 mg tablet Oral     GU PSH: Cysto Dilate Stricture (M or F) - 05/29/2018 Cystoscopy - 08/24/2018 Cystoscopy Fulguration - 09/03/2018 Cystoscopy TURP - 2008 Insert Bladder Cath; Complex - 05/29/2018 Locm 300-399Mg/Ml Iodine,1Ml - 08/18/2018 Remove Prostate Regrowth - 07/06/2018      PSH Notes: Total Hip Replacement, Knee Replacement, Transurethral Resection Of Prostate (TURP)   NON-GU PSH: Revise Knee Joint - 2008 Total Hip Replacement - 2012    GU PMH: Prostate Cancer, Newly diagnosed prostate cancer, intermediate/high risk. Connor [name] has persistent bleeding after his TURP, but is on Xarelto. Connor [name] does have some nodularity near his prostate that I think may be satellite lesions. Additionally, Connor [name] has 1 enlarged pelvic lymph node. Connor [name] will need treatment for his prostate cancer. - 08/24/2018, Stage T1b prostate cancer, noted on recent TURP from March 2nd. His last PSA a couple of years ago was 1.87., - 07/30/2018 BPH w/LUTS - 07/13/2018, - 06/08/2018 (Worsening), Symptoms are getting worse, Connor [name] does have the larger residual urine volume and obstructing prostatic regrowth., - 05/29/2018 (Worsening), 15 years out from TURP. Connor [name] is voiding with were  symptoms and has a moderate residual urine volume, - 04/15/2018 (Stable), Stablle voiding sx's, - 03/06/2017 Urinary Tract Inf, Unspec site - 07/13/2018, Pyuria, - 2014 Incomplete bladder emptying (Worsening) - 06/15/2018 Urinary Retention - 06/05/2018 Gross hematuria (Improving) - 05/29/2018, (Worsening), This may be due to cystitis, - 04/15/2018 (Improving), Probably from  combination of prior TURP (irregular prostatic fossa) as well as blood thinner. Resolved, - 03/06/2017, - 02/19/2017 Meatal stenosis (other urethral stricture), Connor [name] does have strictures along his urethra. - 05/29/2018, Managed with self dilation, - 04/15/2018 (Stable), Pt doing self dilations, - 03/06/2017 (Stable), - 02/19/2017 (Stable), Connor [name] rarely performs self dilation and voids well, - 2017 Urinary incontinence, Unspec - 05/29/2018, This may be urgency in nature, but the patient is has a hard time telling, - 04/15/2018 BPH w/o LUTS (Stable), Connor [name] is 13 years out from TURP and voiding adequately - 2017, Benign prostatic hypertrophy without lower urinary tract symptoms, - 2016 ED due to arterial insufficiency (Stable), Connor [name] does well with sildenafil - 2017, Erectile dysfunction due to arterial insufficiency, - 2016 Bladder-neck stenosis/contracture, Bladder neck obstruction - 2016 Urethral Stricture, Unspec, Meatal stenosis - 2016 Chronic cystitis (w/o hematuria), Chronic cystitis - 2015 Dysuria, Dysuria - 2014 Hematospermia, Hematospermia - 2014      PMH Notes:  2006-08-04 16:25:32 - Note: Peptic Ulcer  2010-07-20 13:10:49 - Note: Venous Thrombosis Of The Deep Vessels Of The Distal Lower Extremity   NON-GU PMH: Encounter for general adult medical examination without abnormal findings, Encounter for preventive health examination - 2016 Cardiac murmur, unspecified, Murmurs - 2014 Personal history of other diseases of the circulatory system, History of hypertension - 2014 Personal history of other endocrine, nutritional and metabolic disease, History of hypercholesterolemia - 2014 Unspecified atrial fibrillation, Atrial Fibrillation - 2014 Rheumatoid Arthritis    FAMILY HISTORY: Death of family member - Runs In [place] Status Number - Runs In Family   SOCIAL HISTORY: Marital Status: Married Preferred Language: English; Ethnicity: Not Hispanic Or Latino; Race: White Current Smoking  Status: Patient does not smoke anymore.   Tobacco Use Assessment Completed: Used Tobacco in last 30 days? Types of alcohol consumed: Wine.  Drinks 1 caffeinated drink per day.    REVIEW OF SYSTEMS:    GU Review Male:   Patient denies frequent urination, hard to postpone urination, burning/ pain with urination, get up at night to urinate, leakage of urine, stream starts and stops, trouble starting your streams, and have to strain to urinate .  Gastrointestinal (Lower):   Patient denies diarrhea and constipation.  Gastrointestinal (Upper):   Patient denies nausea and vomiting.  Constitutional:   Patient denies fever, night sweats, weight loss, and fatigue.  Skin:   Patient denies skin rash/ lesion and itching.  Eyes:   Patient denies blurred vision and double vision.  Ears/ Nose/ Throat:   Patient denies sore throat and sinus problems.  Hematologic/Lymphatic:   Patient denies swollen glands and easy bruising.  Cardiovascular:   Patient denies leg swelling and chest pains.  Respiratory:   Patient denies cough and shortness of breath.  Endocrine:   Patient denies excessive thirst.  Musculoskeletal:   Patient denies back pain and joint pain.  Neurological:   Patient denies headaches and dizziness.  Psychologic:   Patient denies depression and anxiety.   PAST DATA REVIEWED:  Source Of History:  Patient  Lab Test Review:   PSA  Records Review:   Previous Patient Records  X-Ray Review: C.T. Abdomen/Pelvis: Reviewed Films.  Bone Scan: Reviewed  Films.     07/30/18 07/10/16 01/17/16 11/23/13 09/02/11 10/24/09 01/11/09 12/12/08  PSA  Total PSA 2.60 ng/mL 1.87 ng/dl 2.44 ng/dl 1.48  1.20  1.54  1.65  3.01     PROCEDURES:          Teleheatlh This patient encounter is appropriate and reasonable under the circumstances given the patient's particular presentation at this time. The patient has been advised of the potential risks and limitations of this mode of treatment (including, but not  limited to, the absence of in-person examination) and has agreed to be treated in a remote fashion in spite of them.   Any and all of the patient's/patient's family's questions on this issue have been answered, and I have made no promises or guarantees to the patient. The patient has also been advised to contact this office for worsening conditions or problems, and seek emergency medical treatment and/or call 911 if the patient deems either necessary.    ASSESSMENT:      ICD-10 Details  1 GU:   Prostate Cancer - C61      PLAN:           Document Letter(s):  Created for Patient: Clinical Summary         Notes:   1. Oligometastatic prostate cancer: I had a detailed discussion with Connor [name] and his wife today. Connor [name] has met with Dr. Tammi Klippel and Dr. Alen Blew earlier today. We discussed the natural history of lymph node positive prostate cancer in the context of his age and life expectancy. The patient was counseled about the natural history of prostate cancer and the standard treatment options that are available for prostate cancer. It was explained to him how his age and life expectancy, clinical stage, Gleason score, and PSA affect his prognosis, the decision to proceed with additional staging studies, as well as how that information influences recommended treatment strategies. We discussed the roles for active surveillance, radiation therapy, surgical therapy, androgen deprivation, as well as ablative therapy options for the treatment of prostate cancer as appropriate to his individual cancer situation. We discussed the risks and benefits of these options with regard to their impact on cancer control and also in terms of potential adverse events, complications, and impact on quality of life particularly related to urinary and sexual function. The patient was encouraged to ask questions throughout the discussion today and all questions were answered to his stated satisfaction. In addition, the patient  was provided with and/or directed to appropriate resources and literature for further education about prostate cancer and treatment options.   Has been recommended that Connor [name] proceed with systemic therapy with androgen deprivation. Dr. Tammi Klippel also had a discussion with him about the potential benefits of radiation therapy. There is a possibility of cure with lymph node positive disease although the patient is realistic about the chance that Connor [name] could have micrometastatic disease elsewhere that may not be cured with radiation therapy. Connor [name] understands the potential benefits of radiation with regard to local control of his tumor as well. However, we also discussed some of the potential risks of radiation therapy including the fact that it may not improve his survival and the potential risk for radiation related side effects especially on anticoagulation.   Currently, Connor [name] does wish to begin treatment systemically with androgen deprivation with plans to re-evaluate his situation with regard to his voiding and his response to therapy in a couple or a few months with further discussion about radiation therapy at that time. Currently,  Connor [name] appears inclined to proceed with radiation therapy if Connor [name] is doing well at that time.   CC: Dr. Franchot Gallo  Dr. Elayne Snare  Dr. Zola Button  Dr. Tyler Pita    E & M CODE: I spent at least 32 minutes face to face with the patient, more than 50% of that time was spent on counseling and/or coordinating care.

## 2018-09-11 NOTE — Progress Notes (Signed)
Radiation Oncology         [phone] ________________________________  Multidisciplinary Prostate Cancer Clinic Initial Outpatient Consultation- Conducted via WebEx due to current COVID-19 concerns for limiting patient exposure  Name: Connor [name] MRN: [id number]  Date: 09/11/2018  DOB: 28-Mar-1934  WJ:[id number], Gay Filler, MD  Franchot Gallo, MD   REFERRING PHYSICIAN: Franchot Gallo, MD  DIAGNOSIS: 85 y.o. gentleman with Stage T1b adenocarcinoma of the prostate with Gleason score of 4+4, and oligometastatic disease in a right external iliac lymph node with a PSA of 2.6 (5.2 adjusted for finasteride).    ICD-10-CM   1. Malignant neoplasm of prostate ([place]) C61     HISTORY OF PRESENT ILLNESS: Connor [name] is a 85 y.o. male with a diagnosis of prostate cancer found incidentally at the time of recent TURP in 07/2018. He is an established urology patient, followed by Dr. Diona Fanti for BPH with BOO. He originally underwent TURP approximately 16 years ago but began having recurrent retention in 06/2018.  He has continued taking finasteride daily.  He underwent repeat TURP on 07/06/2018 with pathology from the prostate chips showing Gleason 4+4 prostate cancer in 10 to 15% of the tissue. His PSA has remained low, with a most recent PSA of 2.6 on 07/30/2018.  TURP specimen images:        A CT A/P and bone scan were performed on 08/18/2018 for disease staging. Bone scan showed no evidence of osseous metastatic disease. The CT scan, however, revealed one solitary enlarged lymph node in the right pelvic region as well as a suspicious lesion in the right posterior bladder near the bladder neck.            He proceeded to CT-guided biopsy of the lymph node on 09/03/2018, which unfortunately confirmed metastatic prostate adenocarcinoma, Gleason 4+4.    Apr 30 CT biopsy of node metastatic prostate cancer        He also had cystoscopy with fulguration of the prostatic  urethra under the care and direction of Dr. Diona Fanti on 09/03/2018 as well and fortunately, this did not reveal any suspicious bladder lesions in the area showing up as abnormal on the CT scan and confirmed that the most likely source of the persistent hematuria was from his resected prostate.   The patient reviewed the biopsy and imaging results with his urologist and he has kindly been referred today to the multidisciplinary prostate cancer clinic for presentation of pathology and radiology studies in our conference for discussion of potential radiation treatment options and clinical evaluation.  PREVIOUS RADIATION THERAPY: No  PAST MEDICAL HISTORY:  Past Medical History:  Diagnosis Date   Benign localized prostatic hyperplasia with lower urinary tract symptoms (LUTS)    urologist-- dr Diona Fanti   Chronic anticoagulation    Xarelto for afib   Chronic constipation    Chronic cystitis    Gross hematuria    History of DVT of lower extremity 06/08/2010   right femoral dvt dx post op total hip 05-08-2010   History of gastric ulcer    remote   History of kidney stones    one stone. remote hx   Hyperlipidemia    Hypertension    Dx by Dr. Lance Muss around age  62    NICM (nonischemic cardiomyopathy) ([place])    per echo 06-11-2017,  ef 45-50% with diffuse hypokinesis,  G1DD   OSA on CPAP 2017   compliant with CPAP.  managed by Dr Elsworth Soho.   Osteoarthritis  Other urethral stricture, male, meatal    chronic;  pt self dilates   Paroxysmal atrial fibrillation [facility]) cardiologist-- dr Rayann Heman   first dx 2009/   a. documented by Dr Barrett Shell on office EKG 9/12. b. maintained on Tikosyn and Xarelto. c. s/p DCCV 's.  d.  x3  EP w/ ablation atrial fib last one 12-05-2016   Prediabetes    Premature ventricular contraction    PVC's (premature ventricular contractions)    RA (rheumatoid arthritis) ([place])    rheumatologist--- Dr Page Spiro,  treated with Humira   RBBB (right bundle  branch block)    Hx of a fib, ablation Aug 2018   Rheumatoid arthritis ([place])    Vitamin D deficiency    Wears hearing aid in both ears       PAST SURGICAL HISTORY: Past Surgical History:  Procedure Laterality Date   ABLATION OF DYSRHYTHMIC FOCUS  08/29/2015   ATRIAL FIBRILLATION ABLATION  12/05/2016   ATRIAL FIBRILLATION ABLATION N/A 12/05/2016   Procedure: Atrial Fibrillation Ablation;  Surgeon: [name], MD;  Location: [place] [facility];  Service: Cardiovascular;  Laterality: N/A;   ATRIAL FLUTTER ABLATION  09/2010   by [facility] Right 1990s   CARDIOVERSION N/A 10/28/2012   Procedure: CARDIOVERSION;  Surgeon: Burnell Blanks, MD;  Location: [place];  Service: Cardiovascular;  Laterality: N/A;   CATARACT EXTRACTION W/ INTRAOCULAR LENS  IMPLANT, BILATERAL  2019   CYSTOSCOPY WITH BIOPSY N/A 09/03/2018   Procedure: CYSTOSCOPY WITH FULGURATION Fran Lowes;  Surgeon: Franchot Gallo, MD;  Location: [facility];  Service: Urology;  Laterality: N/A;  30 MINS   ELECTROPHYSIOLOGIC STUDY N/A 08/29/2015   Procedure: Atrial Fibrillation Ablation;  Surgeon: [name], MD;  Location: [place] [facility];  Service: Cardiovascular;  Laterality: N/A;   KNEE SURGERY Bilateral x3   1960s & 1970s   open   LEFT HEART CATHETERIZATION WITH CORONARY ANGIOGRAM N/A 06/07/2011   Procedure: LEFT HEART CATHETERIZATION WITH CORONARY ANGIOGRAM;  Surgeon: Peter M Martinique, MD;  Location: [facility] CATH LAB;  Service: Cardiovascular;  Laterality: N/A;    Normal coronary arteries,  low normal LVF   TONSILLECTOMY AND ADENOIDECTOMY  age 89   TOTAL HIP ARTHROPLASTY Right 06/04/10    dr Noemi Chapel  @[facility]    TOTAL KNEE ARTHROPLASTY Bilateral right 1995;  left [place]  06-29-2002   dr humphries  @[facility]    TRANSURETHRAL RESECTION OF PROSTATE N/A 07/06/2018   Procedure: TRANSURETHRAL RESECTION OF THE PROSTATE (TURP);  Surgeon: Franchot Gallo, MD;  Location: [facility];  Service: Urology;  Laterality: N/A;  29 MINS    FAMILY HISTORY:  Family History  Problem Relation Age of Onset   Heart attack Father    Lung disease Mother    Arrhythmia Sister    Atrial fibrillation Brother    Diabetes Neg Hx     SOCIAL HISTORY:  Social History   Socioeconomic History   Marital status: Married    Spouse name: Joycelyn Schmid   Number of children: Not on file   Years of education: Not on file   Highest education level: Not on file  Occupational History   Not on file  Social Needs   Financial resource strain: Not on file   Food insecurity:    Worry: Not on file    Inability: Not on file   Transportation needs:    Medical: Not on file    Non-medical: Not on file  Tobacco  Use   Smoking status: Former Smoker    Packs/day: 1.00    Years: 30.00    Pack years: 30.00    Types: Cigarettes    Last attempt to quit: 05/06/1978    Years since quitting: 40.3   Smokeless tobacco: Never Used  Substance and Sexual Activity   Alcohol use: Yes    Comment: occasionally wine and beer   Drug use: Never   Sexual activity: Not on file  Lifestyle   Physical activity:    Days per week: Not on file    Minutes per session: Not on file   Stress: Not on file  Relationships   Social connections:    Talks on phone: Not on file    Gets together: Not on file    Attends religious service: Not on file    Active member of club or organization: Not on file    Attends meetings of clubs or organizations: Not on file    Relationship status: Not on file   Intimate partner violence:    Fear of current or ex partner: Not on file    Emotionally abused: Not on file    Physically abused: Not on file    Forced sexual activity: Not on file  Other Topics Concern   Not on file  Social History Narrative   he patient lives in [place] with his spouse.  Retired.    ALLERGIES: Penicillins  MEDICATIONS:  Current Outpatient Medications  Medication Sig  Dispense Refill   acetaminophen (TYLENOL) 500 MG tablet Take 1,000-1,500 mg by mouth 2 (two) times daily as needed for moderate pain.      atorvastatin (LIPITOR) 10 MG tablet Take 10 mg by mouth daily.     Carboxymethylcellulose Sodium (THERATEARS OP) Apply 1 drop to eye daily as needed (dry eyes).     Cholecalciferol (VITAMIN D3) 5000 UNITS TABS Take 5,000 Units by mouth See admin instructions. Take 5000 units by mouth every 4 days     clindamycin (CLEOCIN T) 1 % external solution Apply 1 application topically daily as needed (for bumps in scalp). Apply to scalp  2   Cranberry 1000 MG CAPS Take 3,000 mg by mouth every evening.      finasteride (PROSCAR) 5 MG tablet Take 5 mg by mouth every morning.   3   HUMIRA PEN 40 MG/0.4ML PNKT Inject 40 mg as directed every 14 (fourteen) days.   6   metoprolol succinate (TOPROL-XL) 50 MG 24 hr tablet Take 25-50 mg by mouth See admin instructions. Take 25 mg in the morning and 50 mg in the evening     metoprolol tartrate (LOPRESSOR) 25 MG tablet TAKE 1 TABLET BY MOUTH EVERY 6 HOURS AS NEEDED FOR FAST HEART RATES (Patient taking differently: Take 25 mg by mouth every 6 (six) hours as needed (heart rate over 100). ) 30 tablet 6   Multiple Vitamins-Minerals (PRESERVISION AREDS 2) CAPS Take 1 capsule by mouth 2 (two) times a day.     Omega-3 Fatty Acids (OMEGA 3 PO) Take 3 capsules by mouth every evening.      omeprazole (PRILOSEC) 20 MG capsule Take 20 mg by mouth daily as needed (acid reflux).      predniSONE (DELTASONE) 5 MG tablet Take 15 mg by mouth daily.     senna (SENOKOT) 8.6 MG tablet Take 2 tablets by mouth every evening.      vitamin B-12 (CYANOCOBALAMIN) 500 MCG tablet Take 500 mcg by mouth every evening.  XARELTO 20 MG TABS tablet TAKE 1 TABLET BY MOUTH EVERY DAY (Patient taking differently: Take 20 mg by mouth every evening. ) 90 tablet 3   No current facility-administered medications for this encounter.     REVIEW OF  SYSTEMS:  On review of systems, the patient reports that he is doing well overall.  He is continued with intermittent hematuria but without clots and denies dysuria.  He reports a good flow of stream without straining and feels that he is emptying his bladder well on voiding.  He has continued taking finasteride daily.  He denies any chest pain, shortness of breath, cough, fevers, chills, night sweats, unintended weight changes. He denies any bowel disturbances, and denies abdominal pain, nausea or vomiting. He denies any new musculoskeletal or joint aches or pains. A complete review of systems is obtained and is otherwise negative.  PHYSICAL EXAM:  Wt Readings from Last 3 Encounters:  09/01/18 225 lb 9.6 oz (102.3 kg)  08/19/18 219 lb (99.3 kg)  07/08/18 226 lb (102.5 kg)   Temp Readings from Last 3 Encounters:  09/03/18 (!) 97.5 F (36.4 C)  09/03/18 (!) 97.2 F (36.2 C) (Oral)  09/01/18 98 F (36.7 C) (Oral)   BP Readings from Last 3 Encounters:  09/03/18 139/87  09/03/18 138/86  09/01/18 (!) 147/92   Pulse Readings from Last 3 Encounters:  09/03/18 91  09/03/18 83  09/01/18 86   In general this is a well appearing Caucasian gentleman in no acute distress. He's alert and oriented x4 and appropriate throughout the examination. Cardiopulmonary assessment is negative for acute distress and he exhibits normal effort.   KPS = 90  100 - Normal; no complaints; no evidence of disease. 90   - Able to carry on normal activity; minor signs or symptoms of disease. 80   - Normal activity with effort; some signs or symptoms of disease. 68   - Cares for self; unable to carry on normal activity or to do active work. 60   - Requires occasional assistance, but is able to care for most of his personal needs. 50   - Requires considerable assistance and frequent medical care. 9   - Disabled; requires special care and assistance. 50   - Severely disabled; hospital admission is indicated although  death not imminent. 82   - Very sick; hospital admission necessary; active supportive treatment necessary. 10   - Moribund; fatal processes progressing rapidly. 0     - Dead  Karnofsky DA, Abelmann [place], Craver LS and [facility]al [facility] [phone]) The use of the nitrogen mustards in the palliative treatment of carcinoma: with particular reference to bronchogenic carcinoma Cancer 1 [phone]  LABORATORY DATA:  Lab Results  Component Value Date   WBC 9.1 09/03/2018   HGB 11.0 (L) 09/03/2018   HCT 35.4 (L) 09/03/2018   MCV 102.0 (H) 09/03/2018   PLT 170 09/03/2018   Lab Results  Component Value Date   NA 141 09/01/2018   K 4.1 09/01/2018   CL 107 09/01/2018   CO2 28 09/01/2018   Lab Results  Component Value Date   ALT 17 11/17/2017   AST 21 11/17/2017   ALKPHOS 54 11/17/2017   BILITOT 1.2 11/17/2017     RADIOGRAPHY: Nm Bone Scan Whole Body  Result Date: 08/18/2018 CLINICAL DATA:  Prostate cancer. Evaluate for bony metastatic disease. EXAM: NUCLEAR MEDICINE WHOLE BODY BONE SCAN TECHNIQUE: Whole body anterior and posterior images were obtained approximately 3 hours after intravenous injection of radiopharmaceutical.  RADIOPHARMACEUTICALS:  21.8 mCi Technetium-35m MDP IV COMPARISON:  None. FINDINGS: Bilateral knee replacements. Degenerative changes in the feet, left hip, left greater than right sternal clavicular joint, and AC joints. Degenerative changes in the spine, particularly the lumbar spine but also involving the cervical and thoracic spine. The patient is status post right hip replacement. No other bony or soft tissue abnormalities. IMPRESSION: No scintigraphic evidence of bony metastatic disease. Right hip replacement and bilateral knee replacements. Scattered degenerative changes. Electronically Signed   By: Dorise Bullion [name] M.D   On: 08/18/2018 15:15   Ct Biopsy  Result Date: 09/03/2018 INDICATION: 85 year old male with a history of prostate cancer and enlarged enhancing right external  iliac station lymph node. He presents for CT-guided core biopsy to evaluate for metastatic disease. EXAM: CT-guided core biopsy right external iliac station lymph node MEDICATIONS: None. ANESTHESIA/SEDATION: Moderate (conscious) sedation was employed during this procedure. A total of Versed 2 mg and Fentanyl 100 mcg was administered intravenously. Moderate Sedation Time: 18 minutes. The patient's level of consciousness and vital signs were monitored continuously by radiology nursing throughout the procedure under my direct supervision. FLUOROSCOPY TIME:  None COMPLICATIONS: None immediate. PROCEDURE: Informed written consent was obtained from the patient after a thorough discussion of the procedural risks, benefits and alternatives. All questions were addressed. Maximal Sterile Barrier Technique was utilized including caps, mask, sterile gowns, sterile gloves, sterile drape, hand hygiene and skin antiseptic. A timeout was performed prior to the initiation of the procedure. A planning axial CT scan was performed. The lymph node in question was successfully identified. The overlying skin was sterilely prepped and draped in the standard fashion using chlorhexidine skin prep. Local anesthesia was attained by infiltration with 1% lidocaine. A small dermatotomy was made. Under intermittent CT guidance, a 22 gauge Chiba needle was advanced into the retroperitoneal fat overlying the right iliopsoas musculature. Hydro dissection was then performed using approximately 15 mL sterile saline. This was done in an effort to displace the cecum and create a safer window to target the external iliac lymph node. Once the cecum was safely displaced, the Chiba needle was exchanged for a 17 gauge introducer needle which was successfully advanced into the margin of the lymph node. 18 gauge core biopsies were then coaxially obtained using the bio Pince automated biopsy device. The introducer needle was removed. Post biopsy axial CT imaging  demonstrates no evidence of complication. IMPRESSION: Technically successful CT-guided core biopsy of right external iliac station lymph node. Hydro dissection was utilized to displace the cecum and create a safe window for biopsy. Signed, Criselda Peaches, MD, [facility] Electronically Signed   By: Jacqulynn Cadet M.D.   On: 09/03/2018 13:03      IMPRESSION/PLAN:  This visit was conducted via WebEx to spare the patient unnecessary potential exposure in the healthcare setting during the current COVID-19 pandemic. 1. 85 y.o. gentleman with Stage T1b adenocarcinoma of the prostate with Gleason Score of 4+4, and oligometastatic disease in a right external iliac lymph node with a PSA of 2.6 (5.2 adjusted for finasteride).  We discussed the patient's workup and outlined the nature of prostate cancer in the setting of lymph node positive oligometastatic disease.  Accordingly, he is eligible for a variety of potential treatment options including LT-ADT alone or in combination with 8 weeks of external radiation to include the prostate and pelvic lymph nodes in an effort to obtain more durable control of his disease. We discussed the  available radiation techniques, and focused on the details and logistics and delivery. We discussed and outlined the risks, benefits, short and long-term effects associated with radiotherapy and compared and contrasted these with prostatectomy. We discussed the role of SpaceOAR in reducing the rectal toxicity associated with radiotherapy. We also detailed the role of ADT in the treatment of high risk prostate cancer and outlined the associated side effects that could be expected with this therapy.  At the end of the conversation the patient is interested in moving forward with 8 weeks of external beam therapy directed to the prostate and surrounding lymph nodes in combination with LT-ADT. We will contact [facility] to make arrangements for a follow-up appointment with Dr. Diona Fanti to start ADT now with plans to coordinate for fiducial marker and SpaceOAR gel placement prior to simulation to reduce rectal toxicity from radiotherapy.  We would anticipate beginning radiotherapy approximately 2 months after the start of ADT.  We will share our discussion with Dr. Diona Fanti and move forward with treatment planning accordingly.  Given current concerns for patient exposure during the COVID-19 pandemic, this encounter was conducted via WebEx. The patient has given verbal consent for this type of encounter. The time spent during this encounter was 40 minutes. The attendants for this meeting include Tyler Pita MD, Ashlyn Bruning PA-C, [place], patient, Parth Mccormac and wife, Joycelyn Schmid. During the encounter, Tyler Pita MD, Ashlyn Bruning PA-C, and scribe, Wilburn Mylar were located at [place].  Patient Mckale Haffey and wife, Joycelyn Schmid were located at home.   Nicholos Johns, PA-C    Tyler Pita, MD  [facility] Direct Dial: [phone]   Fax: [phone] [email or web address]   Skype   [email or web address]   This document serves as a record of services personally performed by Tyler Pita, MD and Freeman Caldron, PA-C. It was created on their behalf by Wilburn Mylar, a trained medical scribe. The creation of this record is based on the scribe's personal observations and the provider's statements to them. This document has been checked and approved by the attending provider.

## 2018-09-11 NOTE — Progress Notes (Signed)
Hematology and Oncology  for Telemedicine Visits  Connor [name] [id number] 19-Oct-1933 85 y.o. 09/11/2018 8:24 AM Copland, Gay Filler, MDCopland, Gay Filler, MD   I connected with Connor [name] on 09/11/18 at  8:30 AM EDT by video enabled telemedicine visit and verified that I am speaking with the correct person using two identifiers.   I discussed the limitations, risks, security and privacy concerns of performing an evaluation and management service by telemedicine and the availability of in-person appointments. I also discussed with the patient that there may be a patient responsible charge related to this service. The patient expressed understanding and agreed to proceed.  Other persons participating in the visit and their role in the encounter:    Patient's location: Home Provider's location: Office   Reason for the request:    Prostate cancer  HPI: I was asked by Dr. Diona Fanti to evaluate Connor [name] for the diagnosis of prostate cancer.  He is an 85 year old man with history of atrial fibrillation and managed by Dr. Diona Fanti for BPH.  He had PSA in the past which has been relatively low measuring 22.4 and 3.6.  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  Imaging studies including bone scan did not show any evidence of metastatic disease.  CT scan of the abdomen pelvis showed 2.9 x 2.1 cm external iliac lymph node which is new since imaging from 2018.  This was highly suspicious for metastatic disease.  He underwent a CT-guided biopsy on September 03, 2018 which showed metastatic adenocarcinoma consistent with prostate cancer with a Gleason score 4+4=8.  His case was discussed today in the prostate cancer multidisciplinary clinic for future treatment options.  Clinically, he is asymptomatic at this time from his prostate cancer.  He did have  urinary issues including frequency and pneumaturia which has resolved at this time.  He remains reasonably active working in the yard and used to working out before [facility] is closed.  He does not report any headaches, blurry vision, syncope or seizures. Does not report any fevers, chills or sweats.  Does not report any cough, wheezing or hemoptysis.  Does not report any chest pain, palpitation, orthopnea or leg edema.  Does not report any nausea, vomiting or abdominal pain.  Does not report any constipation or diarrhea.  Does not report any skeletal complaints.    Does not report frequency, urgency or hematuria.  Does not report any skin rashes or lesions. Does not report any heat or cold intolerance.  Does not report any lymphadenopathy or petechiae.  Does not report any anxiety or depression.  Remaining review of systems is negative.    Past Medical History:  Diagnosis Date  . Benign localized prostatic hyperplasia with lower urinary tract symptoms (LUTS)    urologist-- dr Diona Fanti  . Chronic anticoagulation    Xarelto for afib  . Chronic constipation   . Chronic cystitis   . Gross hematuria   . History of DVT of lower extremity 06/08/2010   right femoral dvt dx post op total hip 05-08-2010  . History of gastric ulcer    remote  . History of kidney stones    one stone. remote hx  . Hyperlipidemia   . Hypertension    Dx by Dr. Lance Muss around age  89   . NICM (nonischemic cardiomyopathy) ([place])  per echo 06-11-2017,  ef 45-50% with diffuse hypokinesis,  G1DD  . OSA on CPAP 2017   compliant with CPAP.  managed by Dr Elsworth Soho.  . Osteoarthritis   . Other urethral stricture, male, meatal    chronic;  pt self dilates  . Paroxysmal atrial fibrillation [facility]) cardiologist-- dr Rayann Heman   first dx 2009/   a. documented by Dr Barrett Shell on office EKG 9/12. b. maintained on Tikosyn and Xarelto. c. s/p DCCV 's.  d.  x3  EP w/ ablation atrial fib last one 12-05-2016  . Prediabetes   . Premature  ventricular contraction   . PVC's (premature ventricular contractions)   . RA (rheumatoid arthritis) [facility])    rheumatologist--- Dr Page Spiro,  treated with Humira  . RBBB (right bundle branch block)    Hx of a fib, ablation Aug 2018  . Rheumatoid arthritis ([place])   . Vitamin D deficiency   . Wears hearing aid in both ears   :  Past Surgical History:  Procedure Laterality Date  . ABLATION OF DYSRHYTHMIC FOCUS  08/29/2015  . ATRIAL FIBRILLATION ABLATION  12/05/2016  . ATRIAL FIBRILLATION ABLATION N/A 12/05/2016   Procedure: Atrial Fibrillation Ablation;  Surgeon: [name], MD;  Location: [place] [facility];  Service: Cardiovascular;  Laterality: N/A;  . ATRIAL FLUTTER ABLATION  09/2010   by Greggory Brandy  . BUNIONECTOMY Right 1990s  . CARDIOVERSION N/A 10/28/2012   Procedure: CARDIOVERSION;  Surgeon: Burnell Blanks, MD;  Location: [place];  Service: Cardiovascular;  Laterality: N/A;  . CATARACT EXTRACTION W/ INTRAOCULAR LENS  IMPLANT, BILATERAL  2019  . CYSTOSCOPY WITH BIOPSY N/A 09/03/2018   Procedure: CYSTOSCOPY WITH FULGURATION [id number];  Surgeon: Franchot Gallo, MD;  Location: [facility];  Service: Urology;  Laterality: N/A;  30 MINS  . ELECTROPHYSIOLOGIC STUDY N/A 08/29/2015   Procedure: Atrial Fibrillation Ablation;  Surgeon: [name], MD;  Location: [place] [facility];  Service: Cardiovascular;  Laterality: N/A;  . KNEE SURGERY Bilateral x3   1960s & 1970s   open  . LEFT HEART CATHETERIZATION WITH CORONARY ANGIOGRAM N/A 06/07/2011   Procedure: LEFT HEART CATHETERIZATION WITH CORONARY ANGIOGRAM;  Surgeon: Peter M Martinique, MD;  Location: [facility] CATH LAB;  Service: Cardiovascular;  Laterality: N/A;    Normal coronary arteries,  low normal LVF  . TONSILLECTOMY AND ADENOIDECTOMY  age 63  . TOTAL HIP ARTHROPLASTY Right 06/04/10    dr Noemi Chapel  @[facility]   . TOTAL KNEE ARTHROPLASTY Bilateral right 1995;  left 1997  . TRANSURETHRAL RESECTION OF PROSTATE  06-29-2002   dr humphries  @[facility]   .  TRANSURETHRAL RESECTION OF PROSTATE N/A 07/06/2018   Procedure: TRANSURETHRAL RESECTION OF THE PROSTATE (TURP);  Surgeon: Franchot Gallo, MD;  Location: [facility];  Service: Urology;  Laterality: N/A;  45 MINS  :   Current Outpatient Medications:  .  acetaminophen (TYLENOL) 500 MG tablet, Take 1,000-1,500 mg by mouth 2 (two) times daily as needed for moderate pain. , Disp: , Rfl:  .  atorvastatin (LIPITOR) 10 MG tablet, Take 10 mg by mouth daily., Disp: , Rfl:  .  Carboxymethylcellulose Sodium (THERATEARS OP), Apply 1 drop to eye daily as needed (dry eyes)., Disp: , Rfl:  .  Cholecalciferol (VITAMIN D3) 5000 UNITS TABS, Take 5,000 Units by mouth See admin instructions. Take 5000 units by mouth every 4 days, Disp: , Rfl:  .  clindamycin (CLEOCIN T) 1 % external solution, Apply 1 application topically daily as needed (for bumps in scalp).  Apply to scalp, Disp: , Rfl: 2 .  Cranberry 1000 MG CAPS, Take 3,000 mg by mouth every evening. , Disp: , Rfl:  .  finasteride (PROSCAR) 5 MG tablet, Take 5 mg by mouth every morning. , Disp: , Rfl: 3 .  HUMIRA PEN 40 MG/0.4ML PNKT, Inject 40 mg as directed every 14 (fourteen) days. , Disp: , Rfl: 6 .  metoprolol succinate (TOPROL-XL) 50 MG 24 hr tablet, Take 25-50 mg by mouth See admin instructions. Take 25 mg in the morning and 50 mg in the evening, Disp: , Rfl:  .  metoprolol tartrate (LOPRESSOR) 25 MG tablet, TAKE 1 TABLET BY MOUTH EVERY 6 HOURS AS NEEDED FOR FAST HEART RATES (Patient taking differently: Take 25 mg by mouth every 6 (six) hours as needed (heart rate over 100). ), Disp: 30 tablet, Rfl: 6 .  Multiple Vitamins-Minerals (PRESERVISION AREDS 2) CAPS, Take 1 capsule by mouth 2 (two) times a day., Disp: , Rfl:  .  Omega-3 Fatty Acids (OMEGA 3 PO), Take 3 capsules by mouth every evening. , Disp: , Rfl:  .  omeprazole (PRILOSEC) 20 MG capsule, Take 20 mg by mouth daily as needed (acid reflux). , Disp: , Rfl:  .  predniSONE (DELTASONE) 5  MG tablet, Take 15 mg by mouth daily., Disp: , Rfl:  .  senna (SENOKOT) 8.6 MG tablet, Take 2 tablets by mouth every evening. , Disp: , Rfl:  .  vitamin B-12 (CYANOCOBALAMIN) 500 MCG tablet, Take 500 mcg by mouth every evening. , Disp: , Rfl:  .  XARELTO 20 MG TABS tablet, TAKE 1 TABLET BY MOUTH EVERY DAY (Patient taking differently: Take 20 mg by mouth every evening. ), Disp: 90 tablet, Rfl: 3:  Allergies  Allergen Reactions  . Penicillins Itching, Swelling and Other (See Comments)    Did it involve swelling of the face/tongue/throat, SOB, or low BP? No Did it involve sudden or severe rash/hives, skin peeling, or any reaction on the inside of your mouth or nose? No Did you need to seek medical attention at a hospital or doctor's office? No When did it last happen?30 + years If all above answers are "NO", may proceed with cephalosporin use.    :  Family History  Problem Relation Age of Onset  . Heart attack Father   . Lung disease Mother   . Arrhythmia Sister   . Atrial fibrillation Brother   . Diabetes Neg Hx   :  Social History   Socioeconomic History  . Marital status: Married    Spouse name: Not on file  . Number of children: Not on file  . Years of education: Not on file  . Highest education level: Not on file  Occupational History  . Not on file  Social Needs  . Financial resource strain: Not on file  . Food insecurity:    Worry: Not on file    Inability: Not on file  . Transportation needs:    Medical: Not on file    Non-medical: Not on file  Tobacco Use  . Smoking status: Former Smoker    Packs/day: 1.00    Years: 30.00    Pack years: 30.00    Types: Cigarettes    Last attempt to quit: 05/06/1978    Years since quitting: 40.3  . Smokeless tobacco: Never Used  Substance and Sexual Activity  . Alcohol use: Yes    Comment: occasionally wine and beer  . Drug use: Never  . Sexual activity: Not  on file  Lifestyle  . Physical activity:    Days per  week: Not on file    Minutes per session: Not on file  . Stress: Not on file  Relationships  . Social connections:    Talks on phone: Not on file    Gets together: Not on file    Attends religious service: Not on file    Active member of club or organization: Not on file    Attends meetings of clubs or organizations: Not on file    Relationship status: Not on file  . Intimate partner violence:    Fear of current or ex partner: Not on file    Emotionally abused: Not on file    Physically abused: Not on file    Forced sexual activity: Not on file  Other Topics Concern  . Not on file  Social History Narrative   he patient lives in [place] with his spouse.  Retired.  :  Pertinent items are noted in HPI.    CBC    Component Value Date/Time   WBC 9.1 09/03/2018 0955   RBC 3.47 (L) 09/03/2018 0955   HGB 11.0 (L) 09/03/2018 0955   HGB 14.4 11/21/2016 0826   HCT 35.4 (L) 09/03/2018 0955   HCT 42.2 11/21/2016 0826   PLT 170 09/03/2018 0955   PLT 129 (L) 11/21/2016 0826   MCV 102.0 (H) 09/03/2018 0955   MCV 100 (H) 11/21/2016 0826   MCH 31.7 09/03/2018 0955   MCHC 31.1 09/03/2018 0955   RDW 14.1 09/03/2018 0955   RDW 14.3 11/21/2016 0826   LYMPHSABS 0.7 09/03/2018 0955   LYMPHSABS 1.3 11/21/2016 0826   MONOABS 0.4 09/03/2018 0955   EOSABS 0.0 09/03/2018 0955   EOSABS 0.3 11/21/2016 0826   BASOSABS 0.0 09/03/2018 0955   BASOSABS 0.0 11/21/2016 0826     Chemistry      Component Value Date/Time   NA 141 09/01/2018 1030   NA 143 01/16/2018 0921   K 4.1 09/01/2018 1030   CL 107 09/01/2018 1030   CO2 28 09/01/2018 1030   BUN 13 09/01/2018 1030   BUN 18 01/16/2018 0921   CREATININE 0.54 (L) 09/01/2018 1030   CREATININE 0.69 (L) 02/26/2016 1022      Component Value Date/Time   CALCIUM 8.7 (L) 09/01/2018 1030   ALKPHOS 54 11/17/2017 0911   AST 21 11/17/2017 0911   ALT 17 11/17/2017 0911   BILITOT 1.2 11/17/2017 0911       Nm Bone Scan Whole Body  Result  Date: 08/18/2018 CLINICAL DATA:  Prostate cancer. Evaluate for bony metastatic disease. EXAM: NUCLEAR MEDICINE WHOLE BODY BONE SCAN TECHNIQUE: Whole body anterior and posterior images were obtained approximately 3 hours after intravenous injection of radiopharmaceutical. RADIOPHARMACEUTICALS:  21.8 mCi Technetium-38m MDP IV COMPARISON:  None. FINDINGS: Bilateral knee replacements. Degenerative changes in the feet, left hip, left greater than right sternal clavicular joint, and AC joints. Degenerative changes in the spine, particularly the lumbar spine but also involving the cervical and thoracic spine. The patient is status post right hip replacement. No other bony or soft tissue abnormalities. IMPRESSION: No scintigraphic evidence of bony metastatic disease. Right hip replacement and bilateral knee replacements. Scattered degenerative changes. Electronically Signed   By: Dorise Bullion III M.D   On: 08/18/2018 15:15   Ct Biopsy  Result Date: 09/03/2018 INDICATION: 85 year old male with a history of prostate cancer and enlarged enhancing right external iliac station lymph node. He presents for CT-guided core  biopsy to evaluate for metastatic disease. EXAM: CT-guided core biopsy right external iliac station lymph node MEDICATIONS: None. ANESTHESIA/SEDATION: Moderate (conscious) sedation was employed during this procedure. A total of Versed 2 mg and Fentanyl 100 mcg was administered intravenously. Moderate Sedation Time: 18 minutes. The patient's level of consciousness and vital signs were monitored continuously by radiology nursing throughout the procedure under my direct supervision. FLUOROSCOPY TIME:  None COMPLICATIONS: None immediate. PROCEDURE: Informed written consent was obtained from the patient after a thorough discussion of the procedural risks, benefits and alternatives. All questions were addressed. Maximal Sterile Barrier Technique was utilized including caps, mask, sterile gowns, sterile gloves,  sterile drape, hand hygiene and skin antiseptic. A timeout was performed prior to the initiation of the procedure. A planning axial CT scan was performed. The lymph node in question was successfully identified. The overlying skin was sterilely prepped and draped in the standard fashion using chlorhexidine skin prep. Local anesthesia was attained by infiltration with 1% lidocaine. A small dermatotomy was made. Under intermittent CT guidance, a 22 gauge Chiba needle was advanced into the retroperitoneal fat overlying the right iliopsoas musculature. Hydro dissection was then performed using approximately 15 mL sterile saline. This was done in an effort to displace the cecum and create a safer window to target the external iliac lymph node. Once the cecum was safely displaced, the Chiba needle was exchanged for a 17 gauge introducer needle which was successfully advanced into the margin of the lymph node. 18 gauge core biopsies were then coaxially obtained using the bio Pince automated biopsy device. The introducer needle was removed. Post biopsy axial CT imaging demonstrates no evidence of complication. IMPRESSION: Technically successful CT-guided core biopsy of right external iliac station lymph node. Hydro dissection was utilized to displace the cecum and create a safe window for biopsy. Signed, Criselda Peaches, MD, [facility] Electronically Signed   By: Jacqulynn Cadet M.D.   On: 09/03/2018 13:03    Assessment and Plan:   85 year old man with the following:  1.  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  He presented with a low PSA ranging around 2.6-3.2 with documented Gleason score 4+4 = 8.  The natural course of this disease was reviewed today.  Imaging studies were discussed with the reviewing radiologist as well as pathology was reviewed with the reading pathologist.   Treatment options would include systemic therapy alone with androgen deprivation, androgen deprivation therapy with local therapy with radiation treatment as well as the option of escalating systemic therapy with androgen deprivation with additional agents.  These options include systemic chemotherapy, oral androgen synthesis inhibitor and androgen receptor antagonists.   After discussion today, I recommended proceeding with androgen deprivation therapy in the immediate future which will be continued likely indefinitely.  I have also recommended consideration for definitive radiation therapy to the prostate and the lymph node with curative intent.  Escalation of therapy is deferred at this time given his low volume disease as well as his age.  He understands though that additional therapy may be needed in the future given his high risk disease.  From a surveillance standpoint, I will require imaging studies to follow-up on his cancer given his PSA is not indicative of his tumor growth which is rather poorly differentiated.  He also understands that systemic chemotherapy may be needed in the future if he develops recurrent disease.  2.  Prognosis and goals of care:  Although he has advanced disease curative option still exist and aggressive therapy is warranted.    Thank you for the referral.  A copy of this consult has been forwarded to the requesting physician.   I discussed the assessment and treatment plan with the patient. The patient was provided an opportunity to ask questions and all were answered. The patient agreed with the plan and demonstrated an understanding of the instructions.   The patient was advised to call back or seek an in-person evaluation if the symptoms worsen or if the condition fails to improve as anticipated.  I provided 40 minutes of face-to-face video visit time during this encounter, and > 50% was dedicated to reviewing his disease status, imaging studies review,  treatment options and answering questions regarding future plan of care.  Zola Button, MD 09/11/2018 8:24 AM

## 2018-09-14 NOTE — Progress Notes (Signed)
                               Care Plan Summary  Name: Mr. Lary Eckardt lll DOB: 21-Aug-1933  Your Medical Team:   Urologist -  Dr. Raynelle Bring, [facility]  Radiation Oncologist - Dr. Tyler Pita, [facility]   Medical Oncologist - Dr. Zola Button, [place]  Recommendations: 1) Androgen deprivation (hormone therapy) with radiation    * These recommendations are based on information available as of today's consult.      Recommendations may change depending on the results of further tests or exams.    Next Steps: 1) Dr. Alan Ripper office will schedule the hormone injection     When appointments need to be scheduled, you will be contacted by [facility] and/or [facility].  Questions?  Please do not hesitate to call Cira Rue, RN, BSN, OCN at [phone] any questions or concerns.  Shirlean Mylar is your Oncology Nurse Navigator and is available to assist you while you're receiving your medical care at [facility].

## 2018-09-16 ENCOUNTER — Telehealth: Payer: Self-pay | Admitting: *Deleted

## 2018-09-16 NOTE — Telephone Encounter (Signed)
Called patient to inform of ADT appt. May 15- arrival time- 9:45 am @ Dr. Alan Ripper Office, lvm for a return call

## 2018-09-17 ENCOUNTER — Encounter: Payer: Self-pay | Admitting: General Practice

## 2018-09-17 NOTE — Progress Notes (Signed)
[place] Spiritual Care Note  Reached Mr Gesner by phone as Patient and [place] f/u after Prostate Multidisciplinary Clinic. He was in good spirits, citing positive attitude, support from family and friends, and wrapping up a meaningful beach vacation with family (wife, son, granddaughter and her SO). Per pt, no questions or concerns at this time, but he is aware of ongoing Team availability. Please also page if needs arise or circumstances change. Thank you.   [place], [place], [facility] Pager [phone] Voicemail [phone]

## 2018-09-18 ENCOUNTER — Encounter: Payer: Self-pay | Admitting: Medical Oncology

## 2018-09-29 ENCOUNTER — Telehealth: Payer: Self-pay | Admitting: Medical Oncology

## 2018-09-29 NOTE — Telephone Encounter (Signed)
Spoke with Connor [name] as PMDC follow up. He states clinic went well and was very informative.He received Firmagon 5/15 and has tolerated it well. He is scheduled for fiducial markers and SpaceOar gel 6/18. We discussed the radiation planning and is aware Enid Derry will call him to scheduled. Encouraged him to call me with questions or concerns.

## 2018-10-01 ENCOUNTER — Telehealth: Payer: Self-pay | Admitting: Internal Medicine

## 2018-10-01 NOTE — Telephone Encounter (Signed)
Patient with diagnosis of atrial fibrillation on Xarelto for anticoagulation.    Procedure: space oar with gold seed marker Date of procedure: 10/22/2018  CHADS2-VASc score of  4 (CHF, HTN, AGE, DM2, stroke/tia x 2, CAD, AGE, male)  CrCl 150.3 Platelet count 170  Per office protocol, patient can hold Xarelto for 3 days prior to procedure.

## 2018-10-01 NOTE — Telephone Encounter (Signed)
New Message       [facility] Pre-operative Risk Assessment    Request for surgical clearance:  1. What type of surgery is being performed? In office procedure for space oar with gold seed markers    2. When is this surgery scheduled? 10/22/18  3. What type of clearance is required (medical clearance vs. Pharmacy clearance to hold med vs. Both)? Pharmacy   4. Are there any medications that need to be held prior to surgery and how long? Xarelto 3 days prior   5. Practice name and name of physician performing surgery? [facility] Dr Franchot Gallo    6. What is your office phone number [phone]   7.   What is your office fax number [phone]  8.   Anesthesia type (None, local, MAC, general) ? Local    Connor [name] 10/01/2018, 10:39 AM  _________________________________________________________________   (provider comments below)

## 2018-10-01 NOTE — Telephone Encounter (Signed)
   Primary Cardiologist: [name], MD  Chart reviewed as part of pre-operative protocol coverage. Patient was contacted 10/01/2018 in reference to pre-operative risk assessment for pending surgery as outlined below.  QUATAVIOUS ROSSA III was last seen on 08/19/18 by Malka So.  Since that day, HOVANES HYMAS III has done well.  Per our pharmacy staff: Patient with diagnosis of atrial fibrillation on Xarelto for anticoagulation.    Procedure: space oar with gold seed marker Date of procedure: 10/22/2018  CHADS2-VASc score of  4 (CHF, HTN, AGE, DM2, stroke/tia x 2, CAD, AGE, male)  CrCl 150.3 Platelet count 170  Per office protocol, patient can hold Xarelto for 3 days prior to procedure.   Therefore, based on ACC/AHA guidelines, the patient would be at acceptable risk for the planned procedure without further cardiovascular testing.   I will route this recommendation to the requesting party via Epic fax function and remove from pre-op pool.  Please call with questions.  Ledora Bottcher, PA 10/01/2018, 2:37 PM

## 2018-10-01 NOTE — Telephone Encounter (Signed)
Please comment on xarelto. 

## 2018-10-09 ENCOUNTER — Ambulatory Visit: Payer: Medicare Other | Admitting: Radiation Oncology

## 2018-10-16 ENCOUNTER — Other Ambulatory Visit: Payer: Self-pay | Admitting: Urology

## 2018-10-16 ENCOUNTER — Telehealth: Payer: Self-pay | Admitting: *Deleted

## 2018-10-16 DIAGNOSIS — C61 Malignant neoplasm of prostate: Secondary | ICD-10-CM

## 2018-10-16 NOTE — Telephone Encounter (Signed)
Called patient to offer sim appt. For 10-27-18 @ 11 am, due to a cancellation, lvm for a return call

## 2018-10-27 ENCOUNTER — Ambulatory Visit: Payer: Medicare Other | Admitting: Radiation Oncology

## 2018-10-31 ENCOUNTER — Other Ambulatory Visit: Payer: Self-pay | Admitting: Internal Medicine

## 2018-10-31 ENCOUNTER — Other Ambulatory Visit: Payer: Self-pay | Admitting: Endocrinology

## 2018-11-02 NOTE — Telephone Encounter (Signed)
Prescription refill request for Xarelto received.   Last office visit: Dr. Rayann Heman (07-08-2018) Weight: 102.5 kg (07-08-2018) Age: 85 y.o. Scr:  0.54 (09-01-2018) CrCl: 153 ml/min  Prescription refill request sent.

## 2018-11-03 ENCOUNTER — Ambulatory Visit: Payer: Medicare Other | Admitting: Radiation Oncology

## 2018-11-05 ENCOUNTER — Ambulatory Visit (HOSPITAL_COMMUNITY)
Admission: RE | Admit: 2018-11-05 | Discharge: 2018-11-05 | Disposition: A | Discharge status: Home / Self Care | Payer: Medicare Other | Source: Ambulatory Visit | Attending: Urology | Admitting: Urology

## 2018-11-05 ENCOUNTER — Other Ambulatory Visit: Payer: Self-pay

## 2018-11-05 ENCOUNTER — Ambulatory Visit
Admission: RE | Admit: 2018-11-05 | Discharge: 2018-11-05 | Disposition: A | Discharge status: Home / Self Care | Payer: Medicare Other | Source: Ambulatory Visit | Attending: Radiation Oncology | Admitting: Radiation Oncology

## 2018-11-05 DIAGNOSIS — C61 Malignant neoplasm of prostate: Secondary | ICD-10-CM | POA: Insufficient documentation

## 2018-11-05 DIAGNOSIS — Z510 Encounter for antineoplastic radiation therapy: Secondary | ICD-10-CM | POA: Insufficient documentation

## 2018-11-05 NOTE — Progress Notes (Signed)
  Radiation Oncology         [phone] ________________________________  Name: Connor [name] MRN: [id number]  Date: 11/05/2018  DOB: [birth year]  SIMULATION AND TREATMENT PLANNING NOTE    ICD-10-CM   1. Malignant neoplasm of prostate ([place])  C61     DIAGNOSIS:  85 y.o. gentleman with Stage T1b adenocarcinoma of the prostate with Gleason score of 4+4, and oligometastatic disease in a right external iliac lymph node with a PSA of 2.6 (5.2 adjusted for finasteride).  NARRATIVE:  The patient was brought to the [facility].  Identity was confirmed.  All relevant records and images related to the planned course of therapy were reviewed.  The patient freely provided informed written consent to proceed with treatment after reviewing the details related to the planned course of therapy. The consent form was witnessed and verified by the simulation staff.  Then, the patient was set-up in a stable reproducible supine position for radiation therapy.  A vacuum lock pillow device was custom fabricated to position his legs in a reproducible immobilized position.  Then, I performed a urethrogram under sterile conditions to identify the prostatic bed.  CT images were obtained.  Surface markings were placed.  The CT images were loaded into the planning software.  Then the prostate bed target, pelvic lymph node target and avoidance structures including the rectum, bladder, bowel and hips were contoured.  Treatment planning then occurred.  The radiation prescription was entered and confirmed.  A total of one complex treatment devices were fabricated. I have requested : Intensity Modulated Radiotherapy (IMRT) is medically necessary for this case for the following reason:  Rectal sparing.Marland Kitchen  PLAN:  The patient will receive 45 Gy in 25 fractions of 1.8 Gy, followed by a boost to the prostate and positive node to a total dose of 75 Gy with 15 fractions of 2 Gy.   ________________________________   Sheral Apley Tammi Klippel, M.D.

## 2018-11-10 NOTE — Progress Notes (Signed)
[place] [facility] [address] [place], [place] [address] [phone]  Date:  11/12/2018   Name:  Connor [name]   DOB:  January 19, 1934   MRN:  [id number]  PCP:  Darreld Mclean, MD    Chief Complaint: Suture / Staple Removal (11th day of sutures, cut finger with knife)   History of Present Illness:  Connor [name] is a 85 y.o. very pleasant male patient who presents with the following:  Gentleman who I last saw in February of this year to establish care He has history of atrial fibrillation status post ablation x3.  He takes Xarelto Also history of hypertension, sleep apnea, hyperlipidemia, seronegative rheumatoid arthritis and osteoarthritis, DVT following hip replacement 2012  Diagnosed with prostate cancer this year following a TURP for enlarged prostate on March 2  He is a patient of pulmonology, Dr. Elsworth Soho.  Sees him annually in general  Cardiology, Dr. Rayann Heman Rheumatology, Dr. Lenna Gilford.   He sees Dr. Ubaldo Glassing annually for a skin check [name] is his urologist Dr. Noemi Chapel is his ortho   He was diagnosed with stage T1b adenocarcinoma the prostate with a positive lymph node in the right iliac area He is currently being treated by oncology and also radiation oncology- he will start next week, and will 40 treatments.    Here today for suture removal- about 11 days ago he was cutting some corn at home and it slipped- cut his left index finger, dorsal aspect.   Occurred [place] 6[place]28 [place] and had 4 SI sutures placed  It has healed well and he does not have any pain, numbness or change in strength of the finger   Patient Active Problem List   Diagnosis Date Noted  . Malignant neoplasm of prostate ([place]) 09/11/2018  . Enlarged prostate with urinary obstruction 07/06/2018  . Gastrocnemius strain, right, initial encounter 12/10/2017  . Baker's cyst of knee, right 12/10/2017  .  Obesity 03/24/2017  . Paroxysmal atrial fibrillation ([place]) 12/05/2016  . Near syncope 04/20/2016  . Leg hematoma, left, initial encounter 04/20/2016  . Bleeding   . Ecchymosis   . Left hamstring muscle strain   . Muscle tear   . RBBB 08/30/2015  . A-fib ([place]) 08/29/2015  . Chronic systolic dysfunction of left ventricle 07/11/2014  . OSA (obstructive sleep apnea) 04/07/2013  . Multinodular goiter 01/20/2013  . Cervical spondylosis without myelopathy 01/18/2013  . Atrial fibrillation with RVR ([place]) 10/27/2012  . PAF (paroxysmal atrial fibrillation) ([place]) 05/28/2012  . Left ventricular dysfunction 12/17/2011  . Low back pain 09/11/2011  . Fatigue 05/22/2011  . PVC (premature ventricular contraction) 01/18/2011  . Persistent atrial fibrillation 01/12/2011  . Hyperlipidemia 08/02/2010  . Hypertension 08/02/2010  . Osteoarthritis 08/02/2010  . BPH (benign prostatic hyperplasia) 08/02/2010  . GERD (gastroesophageal reflux disease) 08/02/2010  . h/o Atrial flutter ([place]) s/p ablation 2012     Past Medical History:  Diagnosis Date  . Benign localized prostatic hyperplasia with lower urinary tract symptoms (LUTS)    urologist-- dr Diona Fanti  . Chronic anticoagulation    Xarelto for afib  . Chronic constipation   . Chronic cystitis   . Gross hematuria   . History of DVT of lower extremity 06/08/2010   right femoral dvt dx post op total hip 05-08-2010  . History of gastric ulcer    remote  .  History of kidney stones    one stone. remote hx  . Hyperlipidemia   . Hypertension    Dx by Dr. Lance Muss around age  35   . NICM (nonischemic cardiomyopathy) ([place])    per echo 06-11-2017,  ef 45-50% with diffuse hypokinesis,  G1DD  . OSA on CPAP 2017   compliant with CPAP.  managed by Dr Elsworth Soho.  . Osteoarthritis   . Other urethral stricture, male, meatal    chronic;  pt self dilates  . Paroxysmal atrial fibrillation [facility]) cardiologist-- dr Rayann Heman   first dx 2009/   a. documented by Dr  Barrett Shell on office EKG 9/12. b. maintained on Tikosyn and Xarelto. c. s/p DCCV 's.  d.  x3  EP w/ ablation atrial fib last one 12-05-2016  . Prediabetes   . Premature ventricular contraction   . PVC's (premature ventricular contractions)   . RA (rheumatoid arthritis) [facility])    rheumatologist--- Dr Page Spiro,  treated with Humira  . RBBB (right bundle branch block)    Hx of a fib, ablation Aug 2018  . Rheumatoid arthritis ([place])   . Vitamin D deficiency   . Wears hearing aid in both ears     Past Surgical History:  Procedure Laterality Date  . ABLATION OF DYSRHYTHMIC FOCUS  08/29/2015  . ATRIAL FIBRILLATION ABLATION  12/05/2016  . ATRIAL FIBRILLATION ABLATION N/A 12/05/2016   Procedure: Atrial Fibrillation Ablation;  Surgeon: [name], MD;  Location: [place] [facility];  Service: Cardiovascular;  Laterality: N/A;  . ATRIAL FLUTTER ABLATION  09/2010   by Greggory Brandy  . BUNIONECTOMY Right 1990s  . CARDIOVERSION N/A 10/28/2012   Procedure: CARDIOVERSION;  Surgeon: Burnell Blanks, MD;  Location: [place];  Service: Cardiovascular;  Laterality: N/A;  . CATARACT EXTRACTION W/ INTRAOCULAR LENS  IMPLANT, BILATERAL  2019  . CYSTOSCOPY WITH BIOPSY N/A 09/03/2018   Procedure: CYSTOSCOPY WITH FULGURATION RSWNIOEVO;  Surgeon: Franchot Gallo, MD;  Location: [facility];  Service: Urology;  Laterality: N/A;  30 MINS  . ELECTROPHYSIOLOGIC STUDY N/A 08/29/2015   Procedure: Atrial Fibrillation Ablation;  Surgeon: [name], MD;  Location: [place] [facility];  Service: Cardiovascular;  Laterality: N/A;  . KNEE SURGERY Bilateral x3   1960s & 1970s   open  . LEFT HEART CATHETERIZATION WITH CORONARY ANGIOGRAM N/A 06/07/2011   Procedure: LEFT HEART CATHETERIZATION WITH CORONARY ANGIOGRAM;  Surgeon: Peter M Martinique, MD;  Location: [facility] CATH LAB;  Service: Cardiovascular;  Laterality: N/A;    Normal coronary arteries,  low normal LVF  . TONSILLECTOMY AND ADENOIDECTOMY  age 73  . TOTAL HIP ARTHROPLASTY Right 06/04/10     dr Noemi Chapel  @[facility]   . TOTAL KNEE ARTHROPLASTY Bilateral right 1995;  left 1997  . TRANSURETHRAL RESECTION OF PROSTATE  06-29-2002   dr humphries  @[facility]   . TRANSURETHRAL RESECTION OF PROSTATE N/A 07/06/2018   Procedure: TRANSURETHRAL RESECTION OF THE PROSTATE (TURP);  Surgeon: Franchot Gallo, MD;  Location: [facility];  Service: Urology;  Laterality: N/A;  105 MINS    Social History   Tobacco Use  . Smoking status: Former Smoker    Packs/day: 1.00    Years: 30.00    Pack years: 30.00    Types: Cigarettes    Quit date: 05/06/1978    Years since quitting: 40.5  . Smokeless tobacco: Never Used  Substance Use Topics  . Alcohol use: Yes    Comment: occasionally wine and beer  . Drug use: Never  Family History  Problem Relation Age of Onset  . Heart attack Father   . Lung disease Mother   . Arrhythmia Sister   . Atrial fibrillation Brother   . Diabetes Neg Hx     Allergies  Allergen Reactions  . Penicillins Itching, Swelling and Other (See Comments)    Did it involve swelling of the face/tongue/throat, SOB, or low BP? No Did it involve sudden or severe rash/hives, skin peeling, or any reaction on the inside of your mouth or nose? No Did you need to seek medical attention at a hospital or doctor's office? No When did it last happen?30 + years If all above answers are "NO", may proceed with cephalosporin use.      Medication list has been reviewed and updated.  Current Outpatient Medications on File Prior to Visit  Medication Sig Dispense Refill  . acetaminophen (TYLENOL) 500 MG tablet Take 1,000-1,500 mg by mouth 2 (two) times daily as needed for moderate pain.     Marland Kitchen atorvastatin (LIPITOR) 10 MG tablet Take 10 mg by mouth daily.    . Carboxymethylcellulose Sodium (THERATEARS OP) Apply 1 drop to eye daily as needed (dry eyes).    . Cholecalciferol (VITAMIN D3) 5000 UNITS TABS Take 5,000 Units by mouth See admin instructions. Take 5000 units by mouth every  4 days    . clindamycin (CLEOCIN T) 1 % external solution Apply 1 application topically daily as needed (for bumps in scalp). Apply to scalp  2  . Cranberry 1000 MG CAPS Take 3,000 mg by mouth every evening.     . finasteride (PROSCAR) 5 MG tablet Take 5 mg by mouth every morning.   3  . HUMIRA PEN 40 MG/0.4ML PNKT Inject 40 mg as directed every 14 (fourteen) days.   6  . metoprolol succinate (TOPROL-XL) 50 MG 24 hr tablet Take 25-50 mg by mouth See admin instructions. Take 25 mg in the morning and 50 mg in the evening    . metoprolol tartrate (LOPRESSOR) 25 MG tablet TAKE 1 TABLET BY MOUTH EVERY 6 HOURS AS NEEDED FOR FAST HEART RATES (Patient taking differently: Take 25 mg by mouth every 6 (six) hours as needed (heart rate over 100). ) 30 tablet 6  . Multiple Vitamins-Minerals (PRESERVISION AREDS 2) CAPS Take 1 capsule by mouth 2 (two) times a day.    . Omega-3 Fatty Acids (OMEGA 3 PO) Take 3 capsules by mouth every evening.     Marland Kitchen omeprazole (PRILOSEC) 20 MG capsule Take 20 mg by mouth daily as needed (acid reflux).     . predniSONE (DELTASONE) 5 MG tablet Take 15 mg by mouth daily.    . rivaroxaban (XARELTO) 20 MG TABS tablet Take 1 tablet (20 mg total) by mouth every evening. 90 tablet 1  . senna (SENOKOT) 8.6 MG tablet Take 2 tablets by mouth every evening.     . vitamin B-12 (CYANOCOBALAMIN) 500 MCG tablet Take 500 mcg by mouth every evening.      No current facility-administered medications on file prior to visit.     Review of Systems:  As per HPI- otherwise negative. No fever or chills   Physical Examination: Vitals:   11/12/18 0934  BP: 120/78  Pulse: 85  Resp: 16  Temp: 97.6 F (36.4 C)  SpO2: 95%   Vitals:   11/12/18 0934  Weight: 220 lb (99.8 kg)  Height: 6' (1.829 m)   Body mass index is 29.84 kg/m. Ideal Body Weight: Weight in (lb)  to have BMI = 25: 183.9  GEN: WDWN, NAD, Non-toxic, A & O x 3 HEENT: Atraumatic, Normocephalic. Neck supple. No masses, No  LAD. Ears and Nose: No external deformity. CV: RRR, No M/G/R. No JVD. No thrill. No extra heart sounds. PULM: CTA B, no wheezes, crackles, rhonchi. No retractions. No resp. distress. No accessory muscle use. EXTR: No c/c/e NEURO Normal gait.  PSYCH: Normally interactive. Conversant. Not depressed or anxious appearing.  Calm demeanor.  Left index finger displays a healing laceration on the dorsal aspect overlying the middle phalanx. Ready for suture removal Removed #4 SI sutures,  Placed steri strips Finger is NV intact, no weakness  Assessment and Plan:   ICD-10-CM   1. Laceration of left index finger without foreign body without damage to nail, initial encounter  S61.211A   2. Hyperlipidemia, unspecified hyperlipidemia type  E78.5 atorvastatin (LIPITOR) 10 MG tablet  3. Walking troubles  R26.2    Suture removal today- healing well.  Keep steri strips until healed over. Let me know if any sign of infection Refilled chl meds Completed HD parking form  Follow-up: No follow-ups on file.  Meds ordered this encounter  Medications  . atorvastatin (LIPITOR) 10 MG tablet    Sig: Take 1 tablet (10 mg total) by mouth daily.    Dispense:  90 tablet    Refill:  3   No orders of the defined types were placed in this encounter.      Signed Lamar Blinks, MD

## 2018-11-12 ENCOUNTER — Ambulatory Visit (INDEPENDENT_AMBULATORY_CARE_PROVIDER_SITE_OTHER): Payer: Medicare Other | Admitting: Family Medicine

## 2018-11-12 ENCOUNTER — Other Ambulatory Visit: Payer: Self-pay

## 2018-11-12 ENCOUNTER — Encounter: Payer: Self-pay | Admitting: Family Medicine

## 2018-11-12 VITALS — BP 120/78 | HR 85 | Temp 97.6°F | Resp 16 | Ht 72.0 in | Wt 220.0 lb

## 2018-11-12 DIAGNOSIS — S61211A Laceration without foreign body of left index finger without damage to nail, initial encounter: Secondary | ICD-10-CM | POA: Diagnosis not present

## 2018-11-12 DIAGNOSIS — E785 Hyperlipidemia, unspecified: Secondary | ICD-10-CM | POA: Diagnosis not present

## 2018-11-12 DIAGNOSIS — R262 Difficulty in walking, not elsewhere classified: Secondary | ICD-10-CM | POA: Diagnosis not present

## 2018-11-12 MED ORDER — ATORVASTATIN CALCIUM 10 MG PO TABS: 10.0000 mg | ORAL_TABLET | Freq: Every day | ORAL | 3 refills | Status: DC

## 2018-11-12 NOTE — Patient Instructions (Signed)
Great to see you today!  Take care and I will see you for your physical soon  Continue to keep a steri- strip on your wound until healed over.  If any sign of infection or other concern please let me know

## 2018-11-16 DIAGNOSIS — Z510 Encounter for antineoplastic radiation therapy: Secondary | ICD-10-CM | POA: Diagnosis not present

## 2018-11-17 ENCOUNTER — Other Ambulatory Visit: Payer: Self-pay

## 2018-11-17 ENCOUNTER — Ambulatory Visit
Admission: RE | Admit: 2018-11-17 | Discharge: 2018-11-17 | Disposition: A | Discharge status: Home / Self Care | Payer: Medicare Other | Source: Ambulatory Visit | Attending: Radiation Oncology | Admitting: Radiation Oncology

## 2018-11-17 DIAGNOSIS — Z510 Encounter for antineoplastic radiation therapy: Secondary | ICD-10-CM | POA: Diagnosis not present

## 2018-11-18 ENCOUNTER — Ambulatory Visit
Admission: RE | Admit: 2018-11-18 | Discharge: 2018-11-18 | Disposition: A | Discharge status: Home / Self Care | Payer: Medicare Other | Source: Ambulatory Visit | Attending: Radiation Oncology | Admitting: Radiation Oncology

## 2018-11-18 DIAGNOSIS — Z510 Encounter for antineoplastic radiation therapy: Secondary | ICD-10-CM | POA: Diagnosis not present

## 2018-11-19 ENCOUNTER — Other Ambulatory Visit: Payer: Self-pay

## 2018-11-19 ENCOUNTER — Ambulatory Visit
Admission: RE | Admit: 2018-11-19 | Discharge: 2018-11-19 | Disposition: A | Discharge status: Home / Self Care | Payer: Medicare Other | Source: Ambulatory Visit | Attending: Radiation Oncology | Admitting: Radiation Oncology

## 2018-11-19 DIAGNOSIS — Z510 Encounter for antineoplastic radiation therapy: Secondary | ICD-10-CM | POA: Diagnosis not present

## 2018-11-20 ENCOUNTER — Ambulatory Visit
Admission: RE | Admit: 2018-11-20 | Discharge: 2018-11-20 | Disposition: A | Discharge status: Home / Self Care | Payer: Medicare Other | Source: Ambulatory Visit | Attending: Radiation Oncology | Admitting: Radiation Oncology

## 2018-11-20 ENCOUNTER — Other Ambulatory Visit: Payer: Self-pay

## 2018-11-20 DIAGNOSIS — Z510 Encounter for antineoplastic radiation therapy: Secondary | ICD-10-CM | POA: Diagnosis not present

## 2018-11-21 NOTE — Progress Notes (Addendum)
[place] at [facility] [address], [place], [place] [address] [phone]  Date:  11/23/2018   Name:  Connor [name]   DOB:  Sep 10, 1933   MRN:  [id number]  PCP:  Darreld Mclean, MD    Chief Complaint: Annual Exam   History of Present Illness:  Connor [name] is a 85 y.o. very pleasant male patient who presents with the following:  Following up today for chronic medical issues/ physical exam  Pt seen 2 weeks ago for SR for finger laceration -this is healing well He has history of A. fib status post ablation x3, maintained on Xarelto He occasionally notes symptoms of atrial fib  Hypertension, hyperlipidemia, sleep apnea, seronegative RA and osteoarthritis, DVT following hip replacement 2012 Takes Humira for his RA. He is taking prednisone but tapering off gradually per his rheumatologist He underwent a TURP procedure in March, was diagnosed with prostate cancer and does have mets to a groin lymph node He just started radiation treatment- had 5th treatment today He has 35 more to go No far no adverse topical effects noted  His radiation oncologist is Dr. Tammi Klippel   Dr. Rayann Heman is his cardiologist  Dr. Ubaldo Glassing is his dermatologist  Sees Dr. Elsworth Soho annually for CPAP Dr. Trudie Reed is his rheumatologist    He has noted a "neck problem" for years - he was sent to see neurology and then PT - did get better He originally had this issue over 10 years ago, present off and on since then.  He uses a special pillow at night and does some stretches which often help He did PT for this in the past which did help- would like to   We got plain films of his neck back in 2016: IMPRESSION: Diffuse degenerative disc and facet disease. Bilateral multi level neural foraminal narrowing as above. Slight anterolisthesis of C3 on C4. No acute bony abnormality.  Due for lipids today He is feeling fatigued, likely related to lupron.  Also having  hot flashes - he plans to discuss with his urologist.  If no good options for treatment will try an SSRI for him if he likes  He is not exercising much due to recent pandemic and cancer treatment  He does not sleep that well - he does use CPAP.  Got out of the habit of using it prior to TURBT as he was up and down so much  He gets some LE edema - worsens as the day goes on.  No SOB  Echo last year: Left ventricle: The cavity size was mildly dilated. Wall   thickness was normal. Systolic function was mildly reduced. The   estimated ejection fraction was in the range of 45% to 50%.   Diffuse hypokinesis. Doppler parameters are consistent with   abnormal left ventricular relaxation (grade 1 diastolic   dysfunction).  Patient Active Problem List   Diagnosis Date Noted  . Malignant neoplasm of prostate ([place]) 09/11/2018  . Enlarged prostate with urinary obstruction 07/06/2018  . Gastrocnemius strain, right, initial encounter 12/10/2017  . Baker's cyst of knee, right 12/10/2017  . Obesity 03/24/2017  . Paroxysmal atrial fibrillation ([place]) 12/05/2016  . Near syncope 04/20/2016  . Leg hematoma, left, initial encounter 04/20/2016  . Bleeding   . Ecchymosis   . Left hamstring muscle strain   . Muscle tear   . RBBB 08/30/2015  . A-fib ([place]) 08/29/2015  . Chronic systolic dysfunction  of left ventricle 07/11/2014  . OSA (obstructive sleep apnea) 04/07/2013  . Multinodular goiter 01/20/2013  . Cervical spondylosis without myelopathy 01/18/2013  . Atrial fibrillation with RVR ([place]) 10/27/2012  . PAF (paroxysmal atrial fibrillation) ([place]) 05/28/2012  . Left ventricular dysfunction 12/17/2011  . Low back pain 09/11/2011  . Fatigue 05/22/2011  . PVC (premature ventricular contraction) 01/18/2011  . Persistent atrial fibrillation 01/12/2011  . Hyperlipidemia 08/02/2010  . Hypertension 08/02/2010  . Osteoarthritis 08/02/2010  . BPH (benign prostatic hyperplasia) 08/02/2010  . GERD  (gastroesophageal reflux disease) 08/02/2010  . h/o Atrial flutter ([place]) s/p ablation 2012     Past Medical History:  Diagnosis Date  . Benign localized prostatic hyperplasia with lower urinary tract symptoms (LUTS)    urologist-- dr Diona Fanti  . Chronic anticoagulation    Xarelto for afib  . Chronic constipation   . Chronic cystitis   . Gross hematuria   . History of DVT of lower extremity 06/08/2010   right femoral dvt dx post op total hip 05-08-2010  . History of gastric ulcer    remote  . History of kidney stones    one stone. remote hx  . Hyperlipidemia   . Hypertension    Dx by Dr. Lance Muss around age  57   . NICM (nonischemic cardiomyopathy) ([place])    per echo 06-11-2017,  ef 45-50% with diffuse hypokinesis,  G1DD  . OSA on CPAP 2017   compliant with CPAP.  managed by Dr Elsworth Soho.  . Osteoarthritis   . Other urethral stricture, male, meatal    chronic;  pt self dilates  . Paroxysmal atrial fibrillation [facility]) cardiologist-- dr Rayann Heman   first dx 2009/   a. documented by Dr Barrett Shell on office EKG 9/12. b. maintained on Tikosyn and Xarelto. c. s/p DCCV 's.  d.  x3  EP w/ ablation atrial fib last one 12-05-2016  . Prediabetes   . Premature ventricular contraction   . PVC's (premature ventricular contractions)   . RA (rheumatoid arthritis) [facility])    rheumatologist--- Dr Page Spiro,  treated with Humira  . RBBB (right bundle branch block)    Hx of a fib, ablation Aug 2018  . Rheumatoid arthritis ([place])   . Vitamin D deficiency   . Wears hearing aid in both ears     Past Surgical History:  Procedure Laterality Date  . ABLATION OF DYSRHYTHMIC FOCUS  08/29/2015  . ATRIAL FIBRILLATION ABLATION  12/05/2016  . ATRIAL FIBRILLATION ABLATION N/A 12/05/2016   Procedure: Atrial Fibrillation Ablation;  Surgeon: [name], MD;  Location: [place] [facility];  Service: Cardiovascular;  Laterality: N/A;  . ATRIAL FLUTTER ABLATION  09/2010   by Greggory Brandy  . BUNIONECTOMY Right 1990s  .  CARDIOVERSION N/A 10/28/2012   Procedure: CARDIOVERSION;  Surgeon: Burnell Blanks, MD;  Location: [facility];  Service: Cardiovascular;  Laterality: N/A;  . CATARACT EXTRACTION W/ INTRAOCULAR LENS  IMPLANT, BILATERAL  2019  . CYSTOSCOPY WITH BIOPSY N/A 09/03/2018   Procedure: CYSTOSCOPY WITH FULGURATION [id number];  Surgeon: Franchot Gallo, MD;  Location: [facility];  Service: Urology;  Laterality: N/A;  30 MINS  . ELECTROPHYSIOLOGIC STUDY N/A 08/29/2015   Procedure: Atrial Fibrillation Ablation;  Surgeon: [name], MD;  Location: [place] [facility];  Service: Cardiovascular;  Laterality: N/A;  . KNEE SURGERY Bilateral x3   1960s & 1970s   open  . LEFT HEART CATHETERIZATION WITH CORONARY ANGIOGRAM N/A 06/07/2011   Procedure: LEFT HEART CATHETERIZATION WITH CORONARY ANGIOGRAM;  Surgeon:  Peter M Martinique, MD;  Location: [facility] CATH LAB;  Service: Cardiovascular;  Laterality: N/A;    Normal coronary arteries,  low normal LVF  . TONSILLECTOMY AND ADENOIDECTOMY  age 37  . TOTAL HIP ARTHROPLASTY Right 06/04/10    dr Noemi Chapel  @[facility]   . TOTAL KNEE ARTHROPLASTY Bilateral right 1995;  left 1997  . TRANSURETHRAL RESECTION OF PROSTATE  06-29-2002   dr humphries  @[facility]   . TRANSURETHRAL RESECTION OF PROSTATE N/A 07/06/2018   Procedure: TRANSURETHRAL RESECTION OF THE PROSTATE (TURP);  Surgeon: Franchot Gallo, MD;  Location: [facility];  Service: Urology;  Laterality: N/A;  67 MINS    Social History   Tobacco Use  . Smoking status: Former Smoker    Packs/day: 1.00    Years: 30.00    Pack years: 30.00    Types: Cigarettes    Quit date: 05/06/1978    Years since quitting: 40.5  . Smokeless tobacco: Never Used  Substance Use Topics  . Alcohol use: Yes    Comment: occasionally wine and beer  . Drug use: Never    Family History  Problem Relation Age of Onset  . Heart attack Father   . Lung disease Mother   . Arrhythmia Sister   . Atrial fibrillation Brother   . Diabetes Neg Hx      Allergies  Allergen Reactions  . Penicillins Itching, Swelling and Other (See Comments)    Did it involve swelling of the face/tongue/throat, SOB, or low BP? No Did it involve sudden or severe rash/hives, skin peeling, or any reaction on the inside of your mouth or nose? No Did you need to seek medical attention at a hospital or doctor's office? No When did it last happen?30 + years If all above answers are "NO", may proceed with cephalosporin use.      Medication list has been reviewed and updated.  Current Outpatient Medications on File Prior to Visit  Medication Sig Dispense Refill  . acetaminophen (TYLENOL) 500 MG tablet Take 1,000-1,500 mg by mouth 2 (two) times daily as needed for moderate pain.     Marland Kitchen atorvastatin (LIPITOR) 10 MG tablet Take 1 tablet (10 mg total) by mouth daily. 90 tablet 3  . Carboxymethylcellulose Sodium (THERATEARS OP) Apply 1 drop to eye daily as needed (dry eyes).    . Cholecalciferol (VITAMIN D3) 5000 UNITS TABS Take 5,000 Units by mouth See admin instructions. Take 5000 units by mouth every 4 days    . clindamycin (CLEOCIN T) 1 % external solution Apply 1 application topically daily as needed (for bumps in scalp). Apply to scalp  2  . Cranberry 1000 MG CAPS Take 3,000 mg by mouth every evening.     . finasteride (PROSCAR) 5 MG tablet Take 5 mg by mouth every morning.   3  . HUMIRA PEN 40 MG/0.4ML PNKT Inject 40 mg as directed every 14 (fourteen) days.   6  . metoprolol succinate (TOPROL-XL) 50 MG 24 hr tablet Take 25-50 mg by mouth See admin instructions. Take 25 mg in the morning and 50 mg in the evening    . metoprolol tartrate (LOPRESSOR) 25 MG tablet TAKE 1 TABLET BY MOUTH EVERY 6 HOURS AS NEEDED FOR FAST HEART RATES (Patient taking differently: Take 25 mg by mouth every 6 (six) hours as needed (heart rate over 100). ) 30 tablet 6  . Multiple Vitamins-Minerals (PRESERVISION AREDS 2) CAPS Take 1 capsule by mouth 2 (two) times a day.    Marland Kitchen  Omega-3 Fatty Acids (OMEGA 3 PO) Take 3 capsules by mouth every evening.     Marland Kitchen omeprazole (PRILOSEC) 20 MG capsule Take 20 mg by mouth daily as needed (acid reflux).     . predniSONE (DELTASONE) 5 MG tablet Take 15 mg by mouth daily.    . rivaroxaban (XARELTO) 20 MG TABS tablet Take 1 tablet (20 mg total) by mouth every evening. 90 tablet 1  . vitamin B-12 (CYANOCOBALAMIN) 500 MCG tablet Take 500 mcg by mouth every evening.      No current facility-administered medications on file prior to visit.     Review of Systems:  As per HPI- otherwise negative.   Physical Examination: Vitals:   11/23/18 1303  BP: 124/80  Pulse: 78  Resp: 18  Temp: 97.8 F (36.6 C)  SpO2: 98%   Vitals:   11/23/18 1303  Weight: 222 lb (100.7 kg)  Height: 6' (1.829 m)   Body mass index is 30.11 kg/m. Ideal Body Weight: Weight in (lb) to have BMI = 25: 183.9  GEN: WDWN, NAD, Non-toxic, A & O x 3, tall build, looks well  HEENT: Atraumatic, Normocephalic. Neck supple. No masses, No LAD.  TM wnl bilaterally  Ears and Nose: No external deformity. CV: RRR, No M/G/R. No JVD. No thrill. No extra heart sounds. PULM: CTA B, no wheezes, crackles, rhonchi. No retractions. No resp. distress. No accessory muscle use. ABD: S, NT, ND, +BS. No rebound. No HSM. EXTR: No c/c/trace edema bilateral ankles NEURO Normal gait.  PSYCH: Normally interactive. Conversant. Not depressed or anxious appearing.  Calm demeanor.  Normal cervical range of motion.  He has mild tenderness in the right sided paracervical muscles Normal strength, sensation, deep tendon reflexes of all limbs  BP Readings from Last 3 Encounters:  11/23/18 124/80  11/12/18 120/78  09/03/18 139/87   Wt Readings from Last 3 Encounters:  11/23/18 222 lb (100.7 kg)  11/12/18 220 lb (99.8 kg)  09/01/18 225 lb 9.6 oz (102.3 kg)    Assessment and Plan:   ICD-10-CM   1. Physical exam  Z00.00   2. Mixed hyperlipidemia  E78.2 Lipid panel  3. Essential  hypertension  I10 CBC    Comprehensive metabolic panel  4. Seronegative rheumatoid arthritis ([place])  M06.00   5. Paroxysmal atrial fibrillation ([facility])  I48.0   6. Malignant neoplasm of prostate ([place])  C61   7. Leg edema  R60.0 B Nat Peptide  8. Chronic neck pain  M54.2 Ambulatory referral to Physical Therapy   G89.29    Here today for physical exam Labs pain as above Referral back to physical therapy for chronic neck issues BNP pending, suggested compression stockings or socks Recommended Shingrix after completion of radiation  Follow-up: No follow-ups on file.  No orders of the defined types were placed in this encounter.  Orders Placed This Encounter  Procedures  . Lipid panel  . CBC  . Comprehensive metabolic panel  . B Nat Peptide  . Ambulatory referral to Physical Therapy    @SIGN @    Signed Lamar Blinks, MD  Received his labs 7/21- message to pt  Results for orders placed or performed in visit on 11/23/18  Lipid panel  Result Value Ref Range   Cholesterol 149 0 - 200 mg/dL   Triglycerides 94.0 0.0 - 149.0 mg/dL   HDL 60.90 >39.00 mg/dL   VLDL 18.8 0.0 - 40.0 mg/dL   LDL Cholesterol 69 0 - 99 mg/dL   Total CHOL/HDL Ratio 2  NonHDL 87.64   CBC  Result Value Ref Range   WBC 5.4 4.0 - 10.5 K/uL   RBC 3.52 (L) 4.22 - 5.81 Mil/uL   Platelets 188.0 150.0 - 400.0 K/uL   Hemoglobin 9.2 (L) 13.0 - 17.0 g/dL   HCT 30.1 (L) 39.0 - 52.0 %   MCV 85.5 78.0 - 100.0 fl   MCHC 30.6 30.0 - 36.0 g/dL   RDW 16.5 (H) 11.5 - 15.5 %  Comprehensive metabolic panel  Result Value Ref Range   Sodium 142 135 - 145 mEq/L   Potassium 3.8 3.5 - 5.1 mEq/L   Chloride 107 96 - 112 mEq/L   CO2 28 19 - 32 mEq/L   Glucose, Bld 132 (H) 70 - 99 mg/dL   BUN 17 6 - 23 mg/dL   Creatinine, Ser 0.64 0.40 - 1.50 mg/dL   Total Bilirubin 0.6 0.2 - 1.2 mg/dL   Alkaline Phosphatase 60 39 - 117 U/L   AST 14 0 - 37 U/L   ALT 13 0 - 53 U/L   Total Protein 6.2 6.0 - 8.3 g/dL   Albumin 4.1  3.5 - 5.2 g/dL   Calcium 8.5 8.4 - 10.5 mg/dL   GFR 119.51 >60.00 mL/min  B Nat Peptide  Result Value Ref Range   Pro B Natriuretic peptide (BNP) 70.0 0.0 - 100.0 pg/mL

## 2018-11-23 ENCOUNTER — Ambulatory Visit
Admission: RE | Admit: 2018-11-23 | Discharge: 2018-11-23 | Disposition: A | Discharge status: Home / Self Care | Payer: Medicare Other | Source: Ambulatory Visit | Attending: Radiation Oncology | Admitting: Radiation Oncology

## 2018-11-23 ENCOUNTER — Ambulatory Visit (INDEPENDENT_AMBULATORY_CARE_PROVIDER_SITE_OTHER): Payer: Medicare Other | Admitting: Family Medicine

## 2018-11-23 ENCOUNTER — Other Ambulatory Visit: Payer: Self-pay

## 2018-11-23 ENCOUNTER — Encounter: Payer: Self-pay | Admitting: Family Medicine

## 2018-11-23 VITALS — BP 124/80 | HR 78 | Temp 97.8°F | Resp 18 | Ht 72.0 in | Wt 222.0 lb

## 2018-11-23 DIAGNOSIS — M0600 Rheumatoid arthritis without rheumatoid factor, unspecified site: Secondary | ICD-10-CM

## 2018-11-23 DIAGNOSIS — Z0000 Encounter for general adult medical examination without abnormal findings: Secondary | ICD-10-CM | POA: Diagnosis not present

## 2018-11-23 DIAGNOSIS — G8929 Other chronic pain: Secondary | ICD-10-CM

## 2018-11-23 DIAGNOSIS — Z510 Encounter for antineoplastic radiation therapy: Secondary | ICD-10-CM | POA: Diagnosis not present

## 2018-11-23 DIAGNOSIS — I10 Essential (primary) hypertension: Secondary | ICD-10-CM | POA: Diagnosis not present

## 2018-11-23 DIAGNOSIS — R600 Localized edema: Secondary | ICD-10-CM

## 2018-11-23 DIAGNOSIS — M542 Cervicalgia: Secondary | ICD-10-CM

## 2018-11-23 DIAGNOSIS — I480 Paroxysmal atrial fibrillation: Secondary | ICD-10-CM

## 2018-11-23 DIAGNOSIS — C61 Malignant neoplasm of prostate: Secondary | ICD-10-CM

## 2018-11-23 DIAGNOSIS — E782 Mixed hyperlipidemia: Secondary | ICD-10-CM | POA: Diagnosis not present

## 2018-11-23 LAB — COMPREHENSIVE METABOLIC PANEL
ALT: 13 U/L (ref 0–53)
AST: 14 U/L (ref 0–37)
Albumin: 4.1 g/dL (ref 3.5–5.2)
Alkaline Phosphatase: 60 U/L (ref 39–117)
BUN: 17 mg/dL (ref 6–23)
CO2: 28 mEq/L (ref 19–32)
Calcium: 8.5 mg/dL (ref 8.4–10.5)
Chloride: 107 mEq/L (ref 96–112)
Creatinine, Ser: 0.64 mg/dL (ref 0.40–1.50)
GFR: 119.51 mL/min (ref >60.00)
Glucose, Bld: 132 mg/dL — ABNORMAL HIGH (ref 70–99)
Potassium: 3.8 mEq/L (ref 3.5–5.1)
Sodium: 142 mEq/L (ref 135–145)
Total Bilirubin: 0.6 mg/dL (ref 0.2–1.2)
Total Protein: 6.2 g/dL (ref 6.0–8.3)

## 2018-11-23 LAB — CBC
HCT: 30.1 % — ABNORMAL LOW (ref 39.0–52.0)
Hemoglobin: 9.2 g/dL — ABNORMAL LOW (ref 13.0–17.0)
MCHC: 30.6 g/dL (ref 30.0–36.0)
MCV: 85.5 fl (ref 78.0–100.0)
Platelets: 188 10*3/uL (ref 150.0–400.0)
RBC: 3.52 Mil/uL — ABNORMAL LOW (ref 4.22–5.81)
RDW: 16.5 % — ABNORMAL HIGH (ref 11.5–15.5)
WBC: 5.4 10*3/uL (ref 4.0–10.5)

## 2018-11-23 LAB — LIPID PANEL
Cholesterol: 149 mg/dL (ref 0–200)
HDL: 60.9 mg/dL (ref >39.00)
LDL Cholesterol: 69 mg/dL (ref 0–99)
NonHDL: 87.64
Total CHOL/HDL Ratio: 2
Triglycerides: 94 mg/dL (ref 0.0–149.0)
VLDL: 18.8 mg/dL (ref 0.0–40.0)

## 2018-11-23 LAB — BRAIN NATRIURETIC PEPTIDE: Pro B Natriuretic peptide (BNP): 70 pg/mL (ref 0.0–100.0)

## 2018-11-23 NOTE — Patient Instructions (Addendum)
Good to see you today I hope that the rest of your radiation goes ok I will be in touch with your labs asap We will refer you back to PT for your neck  I will be in touch with your labs asap  I think your ankle swelling is likely due to mild leakage of fluid from your legs Wearing knee high compression socks may help I will do a blood test today to make sure no sign of swelling due to heart failure  I would suggest that you have the shingles vaccine once you complete radiation therapy  Take care!    Health Maintenance After Age 44 After age 10, you are at a higher risk for certain long-term diseases and infections as well as injuries from falls. Falls are a major cause of broken bones and head injuries in people who are older than age 65. Getting regular preventive care can help to keep you healthy and well. Preventive care includes getting regular testing and making lifestyle changes as recommended by your health care provider. Talk with your health care provider about:  Which screenings and tests you should have. A screening is a test that checks for a disease when you have no symptoms.  A diet and exercise plan that is right for you. What should I know about screenings and tests to prevent falls? Screening and testing are the best ways to find a health problem early. Early diagnosis and treatment give you the best chance of managing medical conditions that are common after age 1. Certain conditions and lifestyle choices may make you more likely to have a fall. Your health care provider may recommend:  Regular vision checks. Poor vision and conditions such as cataracts can make you more likely to have a fall. If you wear glasses, make sure to get your prescription updated if your vision changes.  Medicine review. Work with your health care provider to regularly review all of the medicines you are taking, including over-the-counter medicines. Ask your health care provider about any side  effects that may make you more likely to have a fall. Tell your health care provider if any medicines that you take make you feel dizzy or sleepy.  Osteoporosis screening. Osteoporosis is a condition that causes the bones to get weaker. This can make the bones weak and cause them to break more easily.  Blood pressure screening. Blood pressure changes and medicines to control blood pressure can make you feel dizzy.  Strength and balance checks. Your health care provider may recommend certain tests to check your strength and balance while standing, walking, or changing positions.  Foot health exam. Foot pain and numbness, as well as not wearing proper footwear, can make you more likely to have a fall.  Depression screening. You may be more likely to have a fall if you have a fear of falling, feel emotionally low, or feel unable to do activities that you used to do.  Alcohol use screening. Using too much alcohol can affect your balance and may make you more likely to have a fall. What actions can I take to lower my risk of falls? General instructions  Talk with your health care provider about your risks for falling. Tell your health care provider if: ? You fall. Be sure to tell your health care provider about all falls, even ones that seem minor. ? You feel dizzy, sleepy, or off-balance.  Take over-the-counter and prescription medicines only as told by your health care provider. These  include any supplements.  Eat a healthy diet and maintain a healthy weight. A healthy diet includes low-fat dairy products, low-fat (lean) meats, and fiber from whole grains, beans, and lots of fruits and vegetables. Home safety  Remove any tripping hazards, such as rugs, cords, and clutter.  Install safety equipment such as grab bars in bathrooms and safety rails on stairs.  Keep rooms and walkways well-lit. Activity   Follow a regular exercise program to stay fit. This will help you maintain your  balance. Ask your health care provider what types of exercise are appropriate for you.  If you need a cane or walker, use it as recommended by your health care provider.  Wear supportive shoes that have nonskid soles. Lifestyle  Do not drink alcohol if your health care provider tells you not to drink.  If you drink alcohol, limit how much you have: ? 0-1 drink a day for women. ? 0-2 drinks a day for men.  Be aware of how much alcohol is in your drink. In the [place] one drink equals one typical bottle of beer (12 oz), one-half glass of wine (5 oz), or one shot of hard liquor (1 oz).  Do not use any products that contain nicotine or tobacco, such as cigarettes and e-cigarettes. If you need help quitting, ask your health care provider. Summary  Having a healthy lifestyle and getting preventive care can help to protect your health and wellness after age 62.  Screening and testing are the best way to find a health problem early and help you avoid having a fall. Early diagnosis and treatment give you the best chance for managing medical conditions that are more common for people who are older than age 68.  Falls are a major cause of broken bones and head injuries in people who are older than age 92. Take precautions to prevent a fall at home.  Work with your health care provider to learn what changes you can make to improve your health and wellness and to prevent falls. This information is not intended to replace advice given to you by your health care provider. Make sure you discuss any questions you have with your health care provider. Document Released: 03/05/2017 Document Revised: 08/13/2018 Document Reviewed: 03/05/2017 Elsevier Patient Education  2020 [employer].

## 2018-11-24 ENCOUNTER — Encounter: Payer: Self-pay | Admitting: Family Medicine

## 2018-11-24 ENCOUNTER — Ambulatory Visit
Admission: RE | Admit: 2018-11-24 | Discharge: 2018-11-24 | Disposition: A | Discharge status: Home / Self Care | Payer: Medicare Other | Source: Ambulatory Visit | Attending: Radiation Oncology | Admitting: Radiation Oncology

## 2018-11-24 ENCOUNTER — Other Ambulatory Visit: Payer: Self-pay

## 2018-11-24 DIAGNOSIS — Z510 Encounter for antineoplastic radiation therapy: Secondary | ICD-10-CM | POA: Diagnosis not present

## 2018-11-25 ENCOUNTER — Ambulatory Visit
Admission: RE | Admit: 2018-11-25 | Discharge: 2018-11-25 | Disposition: A | Discharge status: Home / Self Care | Payer: Medicare Other | Source: Ambulatory Visit | Attending: Radiation Oncology | Admitting: Radiation Oncology

## 2018-11-25 ENCOUNTER — Other Ambulatory Visit: Payer: Self-pay

## 2018-11-25 DIAGNOSIS — Z510 Encounter for antineoplastic radiation therapy: Secondary | ICD-10-CM | POA: Diagnosis not present

## 2018-11-26 ENCOUNTER — Other Ambulatory Visit: Payer: Self-pay

## 2018-11-26 ENCOUNTER — Ambulatory Visit
Admission: RE | Admit: 2018-11-26 | Discharge: 2018-11-26 | Disposition: A | Discharge status: Home / Self Care | Payer: Medicare Other | Source: Ambulatory Visit | Attending: Radiation Oncology | Admitting: Radiation Oncology

## 2018-11-26 ENCOUNTER — Encounter: Payer: Self-pay | Admitting: Family Medicine

## 2018-11-26 DIAGNOSIS — Z510 Encounter for antineoplastic radiation therapy: Secondary | ICD-10-CM | POA: Diagnosis not present

## 2018-11-27 ENCOUNTER — Other Ambulatory Visit: Payer: Self-pay

## 2018-11-27 ENCOUNTER — Ambulatory Visit
Admission: RE | Admit: 2018-11-27 | Discharge: 2018-11-27 | Disposition: A | Discharge status: Home / Self Care | Payer: Medicare Other | Source: Ambulatory Visit | Attending: Radiation Oncology | Admitting: Radiation Oncology

## 2018-11-27 DIAGNOSIS — Z510 Encounter for antineoplastic radiation therapy: Secondary | ICD-10-CM | POA: Diagnosis not present

## 2018-11-30 ENCOUNTER — Ambulatory Visit
Admission: RE | Admit: 2018-11-30 | Discharge: 2018-11-30 | Disposition: A | Discharge status: Home / Self Care | Payer: Medicare Other | Source: Ambulatory Visit | Attending: Radiation Oncology | Admitting: Radiation Oncology

## 2018-11-30 ENCOUNTER — Other Ambulatory Visit: Payer: Self-pay

## 2018-11-30 DIAGNOSIS — Z510 Encounter for antineoplastic radiation therapy: Secondary | ICD-10-CM | POA: Diagnosis not present

## 2018-12-01 ENCOUNTER — Other Ambulatory Visit: Payer: Self-pay

## 2018-12-01 ENCOUNTER — Ambulatory Visit: Payer: Medicare Other | Admitting: Physical Therapy

## 2018-12-01 ENCOUNTER — Encounter: Payer: Self-pay | Admitting: Physical Therapy

## 2018-12-01 ENCOUNTER — Ambulatory Visit
Admission: RE | Admit: 2018-12-01 | Discharge: 2018-12-01 | Disposition: A | Discharge status: Home / Self Care | Payer: Medicare Other | Source: Ambulatory Visit | Attending: Radiation Oncology | Admitting: Radiation Oncology

## 2018-12-01 ENCOUNTER — Ambulatory Visit: Payer: Medicare Other | Attending: Family Medicine | Admitting: Physical Therapy

## 2018-12-01 DIAGNOSIS — R42 Dizziness and giddiness: Secondary | ICD-10-CM

## 2018-12-01 DIAGNOSIS — R2681 Unsteadiness on feet: Secondary | ICD-10-CM | POA: Diagnosis present

## 2018-12-01 DIAGNOSIS — R252 Cramp and spasm: Secondary | ICD-10-CM | POA: Diagnosis present

## 2018-12-01 DIAGNOSIS — Z510 Encounter for antineoplastic radiation therapy: Secondary | ICD-10-CM | POA: Diagnosis not present

## 2018-12-01 DIAGNOSIS — M542 Cervicalgia: Secondary | ICD-10-CM | POA: Insufficient documentation

## 2018-12-01 NOTE — Therapy (Signed)
[place] [address] [place], [place], [address] Phone: [phone]   Fax:  [phone]  Physical Therapy Evaluation  Patient Details  Name: Connor [name] MRN: [id number] Date of Birth: 13-Jul-1933 Referring Provider (PT): [place]   Encounter Date: 12/01/2018  PT End of Session - 12/01/18 1703    Visit Number  1    Date for PT Re-Evaluation  02/01/19    PT Start Time  9622    PT Stop Time  1704    PT Time Calculation (min)  54 min    Activity Tolerance  Patient tolerated treatment well    Behavior During Therapy  [facility] for tasks assessed/performed       Past Medical History:  Diagnosis Date  . Benign localized prostatic hyperplasia with lower urinary tract symptoms (LUTS)    urologist-- dr Diona Fanti  . Chronic anticoagulation    Xarelto for afib  . Chronic constipation   . Chronic cystitis   . Gross hematuria   . History of DVT of lower extremity 06/08/2010   right femoral dvt dx post op total hip 05-08-2010  . History of gastric ulcer    remote  . History of kidney stones    one stone. remote hx  . Hyperlipidemia   . Hypertension    Dx by Dr. Lance Muss around age  21   . NICM (nonischemic cardiomyopathy) ([place])    per echo 06-11-2017,  ef 45-50% with diffuse hypokinesis,  G1DD  . OSA on CPAP 2017   compliant with CPAP.  managed by Dr Elsworth Soho.  . Osteoarthritis   . Other urethral stricture, male, meatal    chronic;  pt self dilates  . Paroxysmal atrial fibrillation [facility]) cardiologist-- dr Rayann Heman   first dx 2009/   a. documented by Dr Barrett Shell on office EKG 9/12. b. maintained on Tikosyn and Xarelto. c. s/p DCCV 's.  d.  x3  EP w/ ablation atrial fib last one 12-05-2016  . Prediabetes   . Premature ventricular contraction   . PVC's (premature ventricular contractions)   . RA (rheumatoid arthritis) [facility])    rheumatologist--- Dr Page Spiro,  treated with Humira  . RBBB (right bundle branch block)    Hx of a fib, ablation Aug 2018  . Rheumatoid arthritis ([place])   . Vitamin D deficiency   . Wears hearing aid in both ears     Past Surgical History:  Procedure Laterality Date  . ABLATION OF DYSRHYTHMIC FOCUS  08/29/2015  . ATRIAL FIBRILLATION ABLATION  12/05/2016  . ATRIAL FIBRILLATION ABLATION N/A 12/05/2016   Procedure: Atrial Fibrillation Ablation;  Surgeon: [name], MD;  Location: [place] [facility];  Service: Cardiovascular;  Laterality: N/A;  . ATRIAL FLUTTER ABLATION  09/2010   by Greggory Brandy  . BUNIONECTOMY Right 1990s  . CARDIOVERSION N/A 10/28/2012   Procedure: CARDIOVERSION;  Surgeon: Burnell Blanks, MD;  Location: [place];  Service: Cardiovascular;  Laterality: N/A;  . CATARACT EXTRACTION W/ INTRAOCULAR LENS  IMPLANT, BILATERAL  2019  . CYSTOSCOPY WITH BIOPSY N/A 09/03/2018   Procedure: CYSTOSCOPY WITH FULGURATION [id number];  Surgeon: Franchot Gallo, MD;  [facility];  Service: Urology;  Laterality: N/A;  30 MINS  . ELECTROPHYSIOLOGIC STUDY N/A 08/29/2015   Procedure: Atrial Fibrillation Ablation;  Surgeon: [name], MD;  Location: [place] [facility];  Service: Cardiovascular;  Laterality: N/A;  . KNEE SURGERY Bilateral x3   1960s & 1970s   open  .  LEFT HEART CATHETERIZATION WITH CORONARY ANGIOGRAM N/A 06/07/2011   Procedure: LEFT HEART CATHETERIZATION WITH CORONARY ANGIOGRAM;  Surgeon: Peter M Martinique, MD;  Location: [facility] CATH LAB;  Service: Cardiovascular;  Laterality: N/A;    Normal coronary arteries,  low normal LVF  . TONSILLECTOMY AND ADENOIDECTOMY  age 87  . TOTAL HIP ARTHROPLASTY Right 06/04/10    dr Noemi Chapel  @[facility]   . TOTAL KNEE ARTHROPLASTY Bilateral right 1995;  left 1997  . TRANSURETHRAL RESECTION OF PROSTATE  06-29-2002   dr humphries  @[facility]   . TRANSURETHRAL RESECTION OF PROSTATE N/A 07/06/2018   Procedure: TRANSURETHRAL RESECTION OF THE PROSTATE (TURP);  Surgeon: Franchot Gallo, MD;  Location: [facility];  Service: Urology;  Laterality: N/A;   45 MINS    There were no vitals filed for this visit.   Subjective Assessment - 12/01/18 1616    Subjective  Patient is familiar to me as I have seen him in the past for neck pain, the last time was over 2 years ago.  He has DDD and stenosis.  He reports that over the past few months he has been having neck pain again.  Reports that he feels that he has not been able to go to the gym. He also reports that he had to have prostate surgery in March.  He reports that he has prostate cancer.  Some of the medicines that he is on is causing some fatigue and other issues.  He does report dizziness, reports that BPPV ruled out.    Limitations  Sitting;Reading;House hold activities    Patient Stated Goals  have less pain    Currently in Pain?  Yes    Pain Score  2     Pain Location  Neck    Pain Orientation  Right    Pain Descriptors / Indicators  Sore    Pain Type  Acute pain    Pain Radiating Towards  denies    Pain Onset  More than a month ago    Aggravating Factors   stress, sitting, sleeping in different positions, pain can be up to 5/10    Pain Relieving Factors  unsure    Effect of Pain on Daily Activities  difficulty sleeping, reading         [facility] PT Assessment - 12/01/18 0001      Assessment   Medical Diagnosis  neck pain, dizziness    Referring Provider (PT)  [place]    Onset Date/Surgical Date  11/01/18    Prior Therapy  2 years ago      Precautions   Precautions  None      Balance Screen   Has the patient fallen in the past 6 months  No    Has the patient had a decrease in activity level because of a fear of falling?   No    Is the patient reluctant to leave their home because of a fear of falling?   No      Home Environment   Additional Comments  does housework and yardwork      Prior Function   Level of Independence  Independent    Vocation  Retired    Leisure  was going to the gym prior to February 2020      Posture/Postural Control   Posture Comments  fwd  head, rounded shoulders      AROM   Overall AROM Comments  cervical flexion and rotation decreased 50%, extension decreased 100% side bending decreased  75%, shoulderw WFL's      Strength   Overall Strength Comments  shoulder strength 4-/5, elbows 4+/5      Palpation   Palpation comment  has tightness with some knots in the right upper trap and the right cervical parapsinals      Standardized Balance Assessment   Standardized Balance Assessment  Timed Up and Go Test;Berg Balance Test      Berg Balance Test   Sit to Stand  Able to stand without using hands and stabilize independently    Standing Unsupported  Able to stand 2 minutes with supervision    Sitting with Back Unsupported but Feet Supported on Floor or Stool  Able to sit safely and securely 2 minutes    Stand to Sit  Sits safely with minimal use of hands    Transfers  Able to transfer safely, minor use of hands    Standing Unsupported with Eyes Closed  Able to stand 10 seconds with supervision    Standing Unsupported with Feet Together  Able to place feet together independently and stand for 1 minute with supervision    From Standing, Reach Forward with Outstretched Arm  Can reach confidently >25 cm (10")    From Standing Position, Pick up Object from Floor  Able to pick up shoe safely and easily    From Standing Position, Turn to Look Behind Over each Shoulder  Turn sideways only but maintains balance    Turn 360 Degrees  Able to turn 360 degrees safely one side only in 4 seconds or less    Standing Unsupported, Alternately Place Feet on Step/Stool  Able to stand independently and complete 8 steps >20 seconds    Standing Unsupported, One Foot in Front  Able to plae foot ahead of the other independently and hold 30 seconds    Standing on One Leg  Able to lift leg independently and hold 5-10 seconds    Total Score  47      Timed Up and Go Test   Normal TUG (seconds)  16                Objective measurements  completed on examination: See above findings.      [place] Adult PT Treatment/Exercise - 12/01/18 0001      Manual Therapy   Manual Therapy  Soft tissue mobilization    Soft tissue mobilization  STM to the right upper trap and right cervical parapsinals               PT Short Term Goals - 12/01/18 1706      PT SHORT TERM GOAL #1   Title  independent with initial HEP    Time  2    Period  Weeks    Status  New        PT Long Term Goals - 12/01/18 1706      PT LONG TERM GOAL #1   Title  understand posture and body mechanics    Time  8    Period  Weeks    Status  New      PT LONG TERM GOAL #2   Title  increase cervical ROM 25%    Time  8    Period  Weeks    Status  New      PT LONG TERM GOAL #3   Title  report 25% easier to back up the car    Time  8    Period  Weeks  Status  New      PT LONG TERM GOAL #4   Title  decrease pain 50%    Time  8    Period  Weeks    Status  New      PT LONG TERM GOAL #5   Title  increase berg balance score to 50/56 to decrease fall risk    Time  8    Period  Weeks    Status  New             Plan - 12/01/18 1704    Clinical Impression Statement  Patient has had some neck issues for a number of years, we last saw him a little over two years ago for the same.  He reports that he has had to stop the gym, due to covid plus he is being treated for prostate cancer.  He reports some balance issues and dizziness as well.  He has significant loss of cervical motions, has knots and tightness in the upper trap and the cervical area especially onthe right    Stability/Clinical Decision Making  Stable/Uncomplicated    Clinical Decision Making  Low    Rehab Potential  Good    PT Frequency  2x / week    PT Duration  8 weeks    PT Treatment/Interventions  Cryotherapy;Electrical Stimulation;Moist Heat;Traction;Ultrasound;Patient/family education;Therapeutic exercise;Therapeutic activities;Manual techniques;Functional mobility  training;Balance training;Neuromuscular re-education;Dry needling    PT Next Visit Plan  work on the ROM and pain in the neck, may treat balance issues    Consulted and Agree with Plan of Care  Patient       Patient will benefit from skilled therapeutic intervention in order to improve the following deficits and impairments:  Decreased range of motion, Increased muscle spasms, Impaired flexibility, Postural dysfunction, Improper body mechanics, Pain, Decreased strength, Decreased balance, Difficulty walking  Visit Diagnosis: 1. Cervicalgia   2. Cramp and spasm   3. Dizziness and giddiness   4. Unsteadiness on feet        Problem List Patient Active Problem List   Diagnosis Date Noted  . Malignant neoplasm of prostate ([place]) 09/11/2018  . Enlarged prostate with urinary obstruction 07/06/2018  . Gastrocnemius strain, right, initial encounter 12/10/2017  . Baker's cyst of knee, right 12/10/2017  . Obesity 03/24/2017  . Paroxysmal atrial fibrillation ([place]) 12/05/2016  . Near syncope 04/20/2016  . Leg hematoma, left, initial encounter 04/20/2016  . Bleeding   . Ecchymosis   . Left hamstring muscle strain   . Muscle tear   . RBBB 08/30/2015  . A-fib ([place]) 08/29/2015  . Chronic systolic dysfunction of left ventricle 07/11/2014  . OSA (obstructive sleep apnea) 04/07/2013  . Multinodular goiter 01/20/2013  . Cervical spondylosis without myelopathy 01/18/2013  . Atrial fibrillation with RVR ([place]) 10/27/2012  . PAF (paroxysmal atrial fibrillation) ([place]) 05/28/2012  . Left ventricular dysfunction 12/17/2011  . Low back pain 09/11/2011  . Fatigue 05/22/2011  . PVC (premature ventricular contraction) 01/18/2011  . Persistent atrial fibrillation 01/12/2011  . Hyperlipidemia 08/02/2010  . Hypertension 08/02/2010  . Osteoarthritis 08/02/2010  . BPH (benign prostatic hyperplasia) 08/02/2010  . GERD (gastroesophageal reflux disease) 08/02/2010  . h/o Atrial flutter [facility]) s/p ablation  2012     Sumner Boast., PT 12/01/2018, 5:09 PM  [place] [address] [place], [place], [address] Phone: [phone]   Fax:  [phone]  Name: Connor [name] MRN: [id number] Date of Birth: February 05, 1934

## 2018-12-02 ENCOUNTER — Ambulatory Visit
Admission: RE | Admit: 2018-12-02 | Discharge: 2018-12-02 | Disposition: A | Discharge status: Home / Self Care | Payer: Medicare Other | Source: Ambulatory Visit | Attending: Radiation Oncology | Admitting: Radiation Oncology

## 2018-12-02 ENCOUNTER — Other Ambulatory Visit: Payer: Self-pay

## 2018-12-02 DIAGNOSIS — Z510 Encounter for antineoplastic radiation therapy: Secondary | ICD-10-CM | POA: Diagnosis not present

## 2018-12-03 ENCOUNTER — Other Ambulatory Visit: Payer: Self-pay

## 2018-12-03 ENCOUNTER — Ambulatory Visit
Admission: RE | Admit: 2018-12-03 | Discharge: 2018-12-03 | Disposition: A | Discharge status: Home / Self Care | Payer: Medicare Other | Source: Ambulatory Visit | Attending: Radiation Oncology | Admitting: Radiation Oncology

## 2018-12-03 DIAGNOSIS — Z510 Encounter for antineoplastic radiation therapy: Secondary | ICD-10-CM | POA: Diagnosis not present

## 2018-12-04 ENCOUNTER — Ambulatory Visit
Admission: RE | Admit: 2018-12-04 | Discharge: 2018-12-04 | Disposition: A | Discharge status: Home / Self Care | Payer: Medicare Other | Source: Ambulatory Visit | Attending: Radiation Oncology | Admitting: Radiation Oncology

## 2018-12-04 ENCOUNTER — Other Ambulatory Visit: Payer: Self-pay

## 2018-12-04 DIAGNOSIS — Z510 Encounter for antineoplastic radiation therapy: Secondary | ICD-10-CM | POA: Diagnosis not present

## 2018-12-07 ENCOUNTER — Ambulatory Visit
Admission: RE | Admit: 2018-12-07 | Discharge: 2018-12-07 | Disposition: A | Discharge status: Home / Self Care | Payer: Medicare Other | Source: Ambulatory Visit | Attending: Radiation Oncology | Admitting: Radiation Oncology

## 2018-12-07 ENCOUNTER — Other Ambulatory Visit: Payer: Self-pay

## 2018-12-07 DIAGNOSIS — Z510 Encounter for antineoplastic radiation therapy: Secondary | ICD-10-CM | POA: Insufficient documentation

## 2018-12-07 DIAGNOSIS — C61 Malignant neoplasm of prostate: Secondary | ICD-10-CM | POA: Diagnosis not present

## 2018-12-08 ENCOUNTER — Ambulatory Visit
Admission: RE | Admit: 2018-12-08 | Discharge: 2018-12-08 | Disposition: A | Discharge status: Home / Self Care | Payer: Medicare Other | Source: Ambulatory Visit | Attending: Radiation Oncology | Admitting: Radiation Oncology

## 2018-12-08 ENCOUNTER — Other Ambulatory Visit: Payer: Self-pay

## 2018-12-08 DIAGNOSIS — Z510 Encounter for antineoplastic radiation therapy: Secondary | ICD-10-CM | POA: Diagnosis not present

## 2018-12-09 ENCOUNTER — Encounter: Payer: Self-pay | Admitting: Physical Therapy

## 2018-12-09 ENCOUNTER — Ambulatory Visit: Payer: Medicare Other | Attending: Family Medicine | Admitting: Physical Therapy

## 2018-12-09 ENCOUNTER — Ambulatory Visit
Admission: RE | Admit: 2018-12-09 | Discharge: 2018-12-09 | Disposition: A | Discharge status: Home / Self Care | Payer: Medicare Other | Source: Ambulatory Visit | Attending: Radiation Oncology | Admitting: Radiation Oncology

## 2018-12-09 ENCOUNTER — Other Ambulatory Visit: Payer: Self-pay

## 2018-12-09 DIAGNOSIS — R252 Cramp and spasm: Secondary | ICD-10-CM | POA: Insufficient documentation

## 2018-12-09 DIAGNOSIS — R42 Dizziness and giddiness: Secondary | ICD-10-CM | POA: Insufficient documentation

## 2018-12-09 DIAGNOSIS — Z510 Encounter for antineoplastic radiation therapy: Secondary | ICD-10-CM | POA: Diagnosis not present

## 2018-12-09 DIAGNOSIS — R2681 Unsteadiness on feet: Secondary | ICD-10-CM | POA: Diagnosis present

## 2018-12-09 DIAGNOSIS — M542 Cervicalgia: Secondary | ICD-10-CM

## 2018-12-09 NOTE — Therapy (Signed)
[place] [address] [place], [place], [address] Phone: [phone]   Fax:  [phone]  Physical Therapy Treatment  Patient Details  Name: Connor [name] MRN: [id number] Date of Birth: Dec 12, 1933 Referring Provider (PT): [place]   Encounter Date: 12/09/2018  PT End of Session - 12/09/18 1600    Visit Number  2    Date for PT Re-Evaluation  02/01/19    PT Start Time  3532    PT Stop Time  1618    PT Time Calculation (min)  48 min    Activity Tolerance  Patient tolerated treatment well    Behavior During Therapy  [facility] for tasks assessed/performed       Past Medical History:  Diagnosis Date  . Benign localized prostatic hyperplasia with lower urinary tract symptoms (LUTS)    urologist-- dr Diona Fanti  . Chronic anticoagulation    Xarelto for afib  . Chronic constipation   . Chronic cystitis   . Gross hematuria   . History of DVT of lower extremity 06/08/2010   right femoral dvt dx post op total hip 05-08-2010  . History of gastric ulcer    remote  . History of kidney stones    one stone. remote hx  . Hyperlipidemia   . Hypertension    Dx by Dr. Lance Muss around age  17   . NICM (nonischemic cardiomyopathy) ([place])    per echo 06-11-2017,  ef 45-50% with diffuse hypokinesis,  G1DD  . OSA on CPAP 2017   compliant with CPAP.  managed by Dr Elsworth Soho.  . Osteoarthritis   . Other urethral stricture, male, meatal    chronic;  pt self dilates  . Paroxysmal atrial fibrillation [facility]) cardiologist-- dr Rayann Heman   first dx 2009/   a. documented by Dr Barrett Shell on office EKG 9/12. b. maintained on Tikosyn and Xarelto. c. s/p DCCV 's.  d.  x3  EP w/ ablation atrial fib last one 12-05-2016  . Prediabetes   . Premature ventricular contraction   . PVC's (premature ventricular contractions)   . RA (rheumatoid arthritis) [facility])    rheumatologist--- Dr Page Spiro,  treated with Humira  . RBBB (right bundle branch block)    Hx  of a fib, ablation Aug 2018  . Rheumatoid arthritis ([place])   . Vitamin D deficiency   . Wears hearing aid in both ears     Past Surgical History:  Procedure Laterality Date  . ABLATION OF DYSRHYTHMIC FOCUS  08/29/2015  . ATRIAL FIBRILLATION ABLATION  12/05/2016  . ATRIAL FIBRILLATION ABLATION N/A 12/05/2016   Procedure: Atrial Fibrillation Ablation;  Surgeon: [name], MD;  Location: [place] [facility];  Service: Cardiovascular;  Laterality: N/A;  . ATRIAL FLUTTER ABLATION  09/2010   by Greggory Brandy  . BUNIONECTOMY Right 1990s  . CARDIOVERSION N/A 10/28/2012   Procedure: CARDIOVERSION;  Surgeon: Burnell Blanks, MD;  Location: [place];  Service: Cardiovascular;  Laterality: N/A;  . CATARACT EXTRACTION W/ INTRAOCULAR LENS  IMPLANT, BILATERAL  2019  . CYSTOSCOPY WITH BIOPSY N/A 09/03/2018   Procedure: CYSTOSCOPY WITH FULGURATION [id number];  Surgeon: Franchot Gallo, MD;  [facility];  Service: Urology;  Laterality: N/A;  30 MINS  . ELECTROPHYSIOLOGIC STUDY N/A 08/29/2015   Procedure: Atrial Fibrillation Ablation;  Surgeon: [name], MD;  Location: [place] [facility];  Service: Cardiovascular;  Laterality: N/A;  . KNEE SURGERY Bilateral x3   1960s & 1970s   open  .  LEFT HEART CATHETERIZATION WITH CORONARY ANGIOGRAM N/A 06/07/2011   Procedure: LEFT HEART CATHETERIZATION WITH CORONARY ANGIOGRAM;  Surgeon: Peter M Martinique, MD;  Location: [facility] CATH LAB;  Service: Cardiovascular;  Laterality: N/A;    Normal coronary arteries,  low normal LVF  . TONSILLECTOMY AND ADENOIDECTOMY  age 70  . TOTAL HIP ARTHROPLASTY Right 06/04/10    dr Noemi Chapel  @[facility]   . TOTAL KNEE ARTHROPLASTY Bilateral right 1995;  left 1997  . TRANSURETHRAL RESECTION OF PROSTATE  06-29-2002   dr humphries  @[facility]   . TRANSURETHRAL RESECTION OF PROSTATE N/A 07/06/2018   Procedure: TRANSURETHRAL RESECTION OF THE PROSTATE (TURP);  Surgeon: Franchot Gallo, MD;  Location: [facility];  Service: Urology;  Laterality: N/A;  45  MINS    There were no vitals filed for this visit.  Subjective Assessment - 12/09/18 1558    Subjective  Patient reports that he is not doing well today, alittle more pain    Currently in Pain?  Yes    Pain Score  4     Pain Location  Neck    Pain Orientation  Right    Aggravating Factors   stress                       [facility] Adult PT Treatment/Exercise - 12/09/18 0001      Moist Heat Therapy   Number Minutes Moist Heat  15 Minutes    Moist Heat Location  Cervical      Electrical Stimulation   Electrical Stimulation Location  right cervical area    Electrical Stimulation Action  IFC    Electrical Stimulation Parameters  supine    Electrical Stimulation Goals  Pain      Traction   Type of Traction  Cervical    Min (lbs)  15    Hold Time  static    Time  10      Manual Therapy   Manual Therapy  Soft tissue mobilization    Manual therapy comments  some passive ROM to end range of cervical rotation and side bending    Soft tissue mobilization  STM to the right upper trap and right cervical parapsinals               PT Short Term Goals - 12/09/18 1601      PT SHORT TERM GOAL #1   Title  independent with initial HEP    Status  On-going        PT Long Term Goals - 12/01/18 1706      PT LONG TERM GOAL #1   Title  understand posture and body mechanics    Time  8    Period  Weeks    Status  New      PT LONG TERM GOAL #2   Title  increase cervical ROM 25%    Time  8    Period  Weeks    Status  New      PT LONG TERM GOAL #3   Title  report 25% easier to back up the car    Time  8    Period  Weeks    Status  New      PT LONG TERM GOAL #4   Title  decrease pain 50%    Time  8    Period  Weeks    Status  New      PT LONG TERM GOAL #5   Title  increase berg balance score to 50/56 to decrease fall risk    Time  8    Period  Weeks    Status  New            Plan - 12/09/18 1600    Clinical Impression Statement  Patient has a  pretty good sized knot in the right upper trap and into the neck area that is very tender, he had pain with the PROM, will need to try some exercises next visit    PT Next Visit Plan  work on the ROM and pain in the neck, may treat balance issues    Consulted and Agree with Plan of Care  Patient       Patient will benefit from skilled therapeutic intervention in order to improve the following deficits and impairments:  Decreased range of motion, Increased muscle spasms, Impaired flexibility, Postural dysfunction, Improper body mechanics, Pain, Decreased strength, Decreased balance, Difficulty walking  Visit Diagnosis: 1. Cervicalgia   2. Cramp and spasm   3. Dizziness and giddiness   4. Unsteadiness on feet        Problem List Patient Active Problem List   Diagnosis Date Noted  . Malignant neoplasm of prostate ([place]) 09/11/2018  . Enlarged prostate with urinary obstruction 07/06/2018  . Gastrocnemius strain, right, initial encounter 12/10/2017  . Baker's cyst of knee, right 12/10/2017  . Obesity 03/24/2017  . Paroxysmal atrial fibrillation ([place]) 12/05/2016  . Near syncope 04/20/2016  . Leg hematoma, left, initial encounter 04/20/2016  . Bleeding   . Ecchymosis   . Left hamstring muscle strain   . Muscle tear   . RBBB 08/30/2015  . A-fib ([place]) 08/29/2015  . Chronic systolic dysfunction of left ventricle 07/11/2014  . OSA (obstructive sleep apnea) 04/07/2013  . Multinodular goiter 01/20/2013  . Cervical spondylosis without myelopathy 01/18/2013  . Atrial fibrillation with RVR ([place]) 10/27/2012  . PAF (paroxysmal atrial fibrillation) ([place]) 05/28/2012  . Left ventricular dysfunction 12/17/2011  . Low back pain 09/11/2011  . Fatigue 05/22/2011  . PVC (premature ventricular contraction) 01/18/2011  . Persistent atrial fibrillation 01/12/2011  . Hyperlipidemia 08/02/2010  . Hypertension 08/02/2010  . Osteoarthritis 08/02/2010  . BPH (benign prostatic hyperplasia) 08/02/2010   . GERD (gastroesophageal reflux disease) 08/02/2010  . h/o Atrial flutter [facility]) s/p ablation 2012     Sumner Boast., PT 12/09/2018, 4:07 PM  [place], [place], [address] Phone: [phone]   Fax:  [phone]  Name: Connor [name] MRN: [id number] Date of Birth: 27-Sep-1933

## 2018-12-10 ENCOUNTER — Other Ambulatory Visit: Payer: Self-pay

## 2018-12-10 ENCOUNTER — Ambulatory Visit
Admission: RE | Admit: 2018-12-10 | Discharge: 2018-12-10 | Disposition: A | Discharge status: Home / Self Care | Payer: Medicare Other | Source: Ambulatory Visit | Attending: Radiation Oncology | Admitting: Radiation Oncology

## 2018-12-10 DIAGNOSIS — Z510 Encounter for antineoplastic radiation therapy: Secondary | ICD-10-CM | POA: Diagnosis not present

## 2018-12-11 ENCOUNTER — Other Ambulatory Visit: Payer: Self-pay

## 2018-12-11 ENCOUNTER — Ambulatory Visit
Admission: RE | Admit: 2018-12-11 | Discharge: 2018-12-11 | Disposition: A | Discharge status: Home / Self Care | Payer: Medicare Other | Source: Ambulatory Visit | Attending: Radiation Oncology | Admitting: Radiation Oncology

## 2018-12-11 DIAGNOSIS — Z510 Encounter for antineoplastic radiation therapy: Secondary | ICD-10-CM | POA: Diagnosis not present

## 2018-12-14 ENCOUNTER — Other Ambulatory Visit: Payer: Self-pay

## 2018-12-14 ENCOUNTER — Ambulatory Visit
Admission: RE | Admit: 2018-12-14 | Discharge: 2018-12-14 | Disposition: A | Discharge status: Home / Self Care | Payer: Medicare Other | Source: Ambulatory Visit | Attending: Radiation Oncology | Admitting: Radiation Oncology

## 2018-12-14 DIAGNOSIS — Z510 Encounter for antineoplastic radiation therapy: Secondary | ICD-10-CM | POA: Diagnosis not present

## 2018-12-15 ENCOUNTER — Ambulatory Visit
Admission: RE | Admit: 2018-12-15 | Discharge: 2018-12-15 | Disposition: A | Discharge status: Home / Self Care | Payer: Medicare Other | Source: Ambulatory Visit | Attending: Radiation Oncology | Admitting: Radiation Oncology

## 2018-12-15 ENCOUNTER — Other Ambulatory Visit: Payer: Self-pay

## 2018-12-15 DIAGNOSIS — Z510 Encounter for antineoplastic radiation therapy: Secondary | ICD-10-CM | POA: Diagnosis not present

## 2018-12-16 ENCOUNTER — Other Ambulatory Visit: Payer: Self-pay

## 2018-12-16 ENCOUNTER — Ambulatory Visit
Admission: RE | Admit: 2018-12-16 | Discharge: 2018-12-16 | Disposition: A | Discharge status: Home / Self Care | Payer: Medicare Other | Source: Ambulatory Visit | Attending: Radiation Oncology | Admitting: Radiation Oncology

## 2018-12-16 DIAGNOSIS — Z510 Encounter for antineoplastic radiation therapy: Secondary | ICD-10-CM | POA: Diagnosis not present

## 2018-12-17 ENCOUNTER — Ambulatory Visit
Admission: RE | Admit: 2018-12-17 | Discharge: 2018-12-17 | Disposition: A | Discharge status: Home / Self Care | Payer: Medicare Other | Source: Ambulatory Visit | Attending: Radiation Oncology | Admitting: Radiation Oncology

## 2018-12-17 ENCOUNTER — Other Ambulatory Visit: Payer: Self-pay

## 2018-12-17 DIAGNOSIS — Z510 Encounter for antineoplastic radiation therapy: Secondary | ICD-10-CM | POA: Diagnosis not present

## 2018-12-18 ENCOUNTER — Ambulatory Visit
Admission: RE | Admit: 2018-12-18 | Discharge: 2018-12-18 | Disposition: A | Discharge status: Home / Self Care | Payer: Medicare Other | Source: Ambulatory Visit | Attending: Radiation Oncology | Admitting: Radiation Oncology

## 2018-12-18 ENCOUNTER — Other Ambulatory Visit: Payer: Self-pay

## 2018-12-18 DIAGNOSIS — Z510 Encounter for antineoplastic radiation therapy: Secondary | ICD-10-CM | POA: Diagnosis not present

## 2018-12-21 ENCOUNTER — Ambulatory Visit
Admission: RE | Admit: 2018-12-21 | Discharge: 2018-12-21 | Disposition: A | Discharge status: Home / Self Care | Payer: Medicare Other | Source: Ambulatory Visit | Attending: Radiation Oncology | Admitting: Radiation Oncology

## 2018-12-21 ENCOUNTER — Other Ambulatory Visit: Payer: Self-pay

## 2018-12-21 DIAGNOSIS — Z510 Encounter for antineoplastic radiation therapy: Secondary | ICD-10-CM | POA: Diagnosis not present

## 2018-12-22 ENCOUNTER — Encounter: Payer: Self-pay | Admitting: Physical Therapy

## 2018-12-22 ENCOUNTER — Ambulatory Visit
Admission: RE | Admit: 2018-12-22 | Discharge: 2018-12-22 | Disposition: A | Discharge status: Home / Self Care | Payer: Medicare Other | Source: Ambulatory Visit | Attending: Radiation Oncology | Admitting: Radiation Oncology

## 2018-12-22 ENCOUNTER — Ambulatory Visit: Payer: Medicare Other | Admitting: Physical Therapy

## 2018-12-22 ENCOUNTER — Other Ambulatory Visit: Payer: Self-pay

## 2018-12-22 DIAGNOSIS — M542 Cervicalgia: Secondary | ICD-10-CM

## 2018-12-22 DIAGNOSIS — R42 Dizziness and giddiness: Secondary | ICD-10-CM

## 2018-12-22 DIAGNOSIS — R2681 Unsteadiness on feet: Secondary | ICD-10-CM

## 2018-12-22 DIAGNOSIS — R252 Cramp and spasm: Secondary | ICD-10-CM

## 2018-12-22 DIAGNOSIS — Z510 Encounter for antineoplastic radiation therapy: Secondary | ICD-10-CM | POA: Diagnosis not present

## 2018-12-22 NOTE — Therapy (Signed)
[address] [place] [place], [place], [address] Phone: [phone]   Fax:  [phone]  Physical Therapy Treatment  Patient Details  Name: Connor [name] MRN: [id number] Date of Birth: 1933/05/29 Referring Provider (PT): [place]   Encounter Date: 12/22/2018  PT End of Session - 12/22/18 1522    Visit Number  3    Date for PT Re-Evaluation  02/01/19    PT Start Time  1440    PT Stop Time  1530    PT Time Calculation (min)  50 min    Activity Tolerance  Patient tolerated treatment well    Behavior During Therapy  [facility] for tasks assessed/performed       Past Medical History:  Diagnosis Date  . Benign localized prostatic hyperplasia with lower urinary tract symptoms (LUTS)    urologist-- dr Diona Fanti  . Chronic anticoagulation    Xarelto for afib  . Chronic constipation   . Chronic cystitis   . Gross hematuria   . History of DVT of lower extremity 06/08/2010   right femoral dvt dx post op total hip 05-08-2010  . History of gastric ulcer    remote  . History of kidney stones    one stone. remote hx  . Hyperlipidemia   . Hypertension    Dx by Dr. Lance Muss around age  85   . NICM (nonischemic cardiomyopathy) ([facility])    per echo 06-11-2017,  ef 45-50% with diffuse hypokinesis,  G1DD  . OSA on CPAP 2017   compliant with CPAP.  managed by Dr Elsworth Soho.  . Osteoarthritis   . Other urethral stricture, male, meatal    chronic;  pt self dilates  . Paroxysmal atrial fibrillation [facility]) cardiologist-- dr Rayann Heman   first dx 2009/   a. documented by Dr Barrett Shell on office EKG 9/12. b. maintained on Tikosyn and Xarelto. c. s/p DCCV 's.  d.  x3  EP w/ ablation atrial fib last one 12-05-2016  . Prediabetes   . Premature ventricular contraction   . PVC's (premature ventricular contractions)   . RA (rheumatoid arthritis) [facility])    rheumatologist--- Dr Page Spiro,  treated with Humira  . RBBB (right bundle branch block)    Hx  of a fib, ablation Aug 2018  . Rheumatoid arthritis ([place])   . Vitamin D deficiency   . Wears hearing aid in both ears     Past Surgical History:  Procedure Laterality Date  . ABLATION OF DYSRHYTHMIC FOCUS  08/29/2015  . ATRIAL FIBRILLATION ABLATION  12/05/2016  . ATRIAL FIBRILLATION ABLATION N/A 12/05/2016   Procedure: Atrial Fibrillation Ablation;  Surgeon: [name], MD;  Location: [place] CV [facility];  Service: Cardiovascular;  Laterality: N/A;  . ATRIAL FLUTTER ABLATION  09/2010   by Greggory Brandy  . BUNIONECTOMY Right 1990s  . CARDIOVERSION N/A 10/28/2012   Procedure: CARDIOVERSION;  Surgeon: Burnell Blanks, MD;  Location: [place];  Service: Cardiovascular;  Laterality: N/A;  . CATARACT EXTRACTION W/ INTRAOCULAR LENS  IMPLANT, BILATERAL  2019  . CYSTOSCOPY WITH BIOPSY N/A 09/03/2018   Procedure: CYSTOSCOPY WITH FULGURATION [id number];  Surgeon: Franchot Gallo, MD;  Location: [facility];  Service: Urology;  Laterality: N/A;  30 MINS  . ELECTROPHYSIOLOGIC STUDY N/A 08/29/2015   Procedure: Atrial Fibrillation Ablation;  Surgeon: [name], MD;  Location: [place] CV LAB;  Service: Cardiovascular;  Laterality: N/A;  . KNEE SURGERY Bilateral x3   1960s & 1970s   open  .  LEFT HEART CATHETERIZATION WITH CORONARY ANGIOGRAM N/A 06/07/2011   Procedure: LEFT HEART CATHETERIZATION WITH CORONARY ANGIOGRAM;  Surgeon: Peter M Martinique, MD;  Location: [facility] CATH LAB;  Service: Cardiovascular;  Laterality: N/A;    Normal coronary arteries,  low normal LVF  . TONSILLECTOMY AND ADENOIDECTOMY  age  85  . TOTAL HIP ARTHROPLASTY Right 06/04/10    dr Noemi Chapel  @[facility]   . TOTAL KNEE ARTHROPLASTY Bilateral right 1995;  left 1997  . TRANSURETHRAL RESECTION OF PROSTATE  06-29-2002   dr humphries  @[facility]   . TRANSURETHRAL RESECTION OF PROSTATE N/A 07/06/2018   Procedure: TRANSURETHRAL RESECTION OF THE PROSTATE (TURP);  Surgeon: Franchot Gallo, MD;  Location: [facility];  Service: Urology;  Laterality: N/A;  45  MINS    There were no vitals filed for this visit.  Subjective Assessment - 12/22/18 1449    Subjective  Patient reports that he is feeling looser in the neck, but still is off balance and "lightheaded"    Currently in Pain?  Yes    Pain Score  4     Pain Location  Neck                       [facility] Adult PT Treatment/Exercise - 12/22/18 0001      High Level Balance   High Level Balance Comments  step over side to side, ball toss with stepping, ball toss on the airex, head turns on the airex      Neck Exercises: Machines for Strengthening   Other Machines for Strengthening  seated rows 20# 2x10    Other Machines for Strengthening  Nustep Level 4 x 6 minutes      Neck Exercises: Theraband   Scapula Retraction  20 reps;Red    Shoulder Extension  20 reps;Red    Shoulder External Rotation  20 reps;Red      Moist Heat Therapy   Number Minutes Moist Heat  10 Minutes    Moist Heat Location  Cervical      Electrical Stimulation   Electrical Stimulation Location  right cervical area    Electrical Stimulation Action  IFC    Electrical Stimulation Parameters  sitting    Electrical Stimulation Goals  Pain      Manual Therapy   Manual Therapy  Soft tissue mobilization    Manual therapy comments  some passive ROM to end range of cervical rotation and side bending    Soft tissue mobilization  STM to the right upper trap and right cervical parapsinals               PT Short Term Goals - 12/22/18 1525      PT SHORT TERM GOAL #1   Title  independent with initial HEP    Status  Achieved        PT Long Term Goals - 12/01/18 1706      PT LONG TERM GOAL #1   Title  understand posture and body mechanics    Time  8    Period  Weeks    Status  New      PT LONG TERM GOAL #2   Title  increase cervical ROM 25%    Time  8    Period  Weeks    Status  New      PT LONG TERM GOAL #3   Title  report 25% easier to back up the car    Time  8  Period  Weeks     Status  New      PT LONG TERM GOAL #4   Title  decrease pain 50%    Time  8    Period  Weeks    Status  New      PT LONG TERM GOAL #5   Title  increase berg balance score to 50/56 to decrease fall risk    Time  8    Period  Weeks    Status  New            Plan - 12/22/18 1522    Clinical Impression Statement  During working on balance we were going pretty rapidly ball toss and side stepping, he became very short of breath and reported "a hot flash".  He did need to sit and rest and take off his mask for a while in a seperate room.  Otherwise he did great witht he balance so maybe his lightheadedness is the tension in his neck.    PT Next Visit Plan  work on scapular stability and the tension in his neck    Consulted and Agree with Plan of Care  Patient       Patient will benefit from skilled therapeutic intervention in order to improve the following deficits and impairments:  Decreased range of motion, Increased muscle spasms, Impaired flexibility, Postural dysfunction, Improper body mechanics, Pain, Decreased strength, Decreased balance, Difficulty walking  Visit Diagnosis: 1. Cervicalgia   2. Cramp and spasm   3. Dizziness and giddiness   4. Unsteadiness on feet        Problem List Patient Active Problem List   Diagnosis Date Noted  . Malignant neoplasm of prostate ([place]) 09/11/2018  . Enlarged prostate with urinary obstruction 07/06/2018  . Gastrocnemius strain, right, initial encounter 12/10/2017  . Baker's cyst of knee, right 12/10/2017  . Obesity 03/24/2017  . Paroxysmal atrial fibrillation ([place]) 12/05/2016  . Near syncope 04/20/2016  . Leg hematoma, left, initial encounter 04/20/2016  . Bleeding   . Ecchymosis   . Left hamstring muscle strain   . Muscle tear   . RBBB 08/30/2015  . A-fib ([place]) 08/29/2015  . Chronic systolic dysfunction of left ventricle 07/11/2014  . OSA (obstructive sleep apnea) 04/07/2013  . Multinodular goiter 01/20/2013  . Cervical  spondylosis without myelopathy 01/18/2013  . Atrial fibrillation with RVR ([place]) 10/27/2012  . PAF (paroxysmal atrial fibrillation) ([place]) 05/28/2012  . Left ventricular dysfunction 12/17/2011  . Low back pain 09/11/2011  . Fatigue 05/22/2011  . PVC (premature ventricular contraction) 01/18/2011  . Persistent atrial fibrillation 01/12/2011  . Hyperlipidemia 08/02/2010  . Hypertension 08/02/2010  . Osteoarthritis 08/02/2010  . BPH (benign prostatic hyperplasia) 08/02/2010  . GERD (gastroesophageal reflux disease) 08/02/2010  . h/o Atrial flutter [facility]) s/p ablation 2012     Sumner Boast., PT 12/22/2018, 3:26 PM  [place] [place], [place], [address] Phone: [phone]   Fax:  [phone]  Name: NADAV SWINDELL MRN: [id number] Date of Birth: 30-Jul-1933

## 2018-12-23 ENCOUNTER — Other Ambulatory Visit: Payer: Self-pay

## 2018-12-23 ENCOUNTER — Ambulatory Visit
Admission: RE | Admit: 2018-12-23 | Discharge: 2018-12-23 | Disposition: A | Discharge status: Home / Self Care | Payer: Medicare Other | Source: Ambulatory Visit | Attending: Radiation Oncology | Admitting: Radiation Oncology

## 2018-12-23 DIAGNOSIS — Z510 Encounter for antineoplastic radiation therapy: Secondary | ICD-10-CM | POA: Diagnosis not present

## 2018-12-24 ENCOUNTER — Other Ambulatory Visit: Payer: Self-pay

## 2018-12-24 ENCOUNTER — Ambulatory Visit: Payer: Medicare Other | Admitting: Physical Therapy

## 2018-12-24 ENCOUNTER — Encounter: Payer: Self-pay | Admitting: Physical Therapy

## 2018-12-24 ENCOUNTER — Ambulatory Visit
Admission: RE | Admit: 2018-12-24 | Discharge: 2018-12-24 | Disposition: A | Discharge status: Home / Self Care | Payer: Medicare Other | Source: Ambulatory Visit | Attending: Radiation Oncology | Admitting: Radiation Oncology

## 2018-12-24 DIAGNOSIS — R252 Cramp and spasm: Secondary | ICD-10-CM

## 2018-12-24 DIAGNOSIS — Z510 Encounter for antineoplastic radiation therapy: Secondary | ICD-10-CM | POA: Diagnosis not present

## 2018-12-24 DIAGNOSIS — R2681 Unsteadiness on feet: Secondary | ICD-10-CM

## 2018-12-24 DIAGNOSIS — R42 Dizziness and giddiness: Secondary | ICD-10-CM

## 2018-12-24 DIAGNOSIS — M542 Cervicalgia: Secondary | ICD-10-CM

## 2018-12-24 NOTE — Therapy (Signed)
[address] [place], [place], [address] Phone: [phone]   Fax:  [phone]  Physical Therapy Treatment  Patient Details  Name: Connor [name] MRN: [id number] Date of Birth: 1933-12-10 Referring Provider (PT): [place]   Encounter Date: 12/24/2018  PT End of Session - 12/24/18 1508    Visit Number  4    Date for PT Re-Evaluation  02/01/19    PT Start Time  9024    PT Stop Time  1523    PT Time Calculation (min)  45 min    Activity Tolerance  Patient tolerated treatment well    Behavior During Therapy  [facility] for tasks assessed/performed       Past Medical History:  Diagnosis Date  . Benign localized prostatic hyperplasia with lower urinary tract symptoms (LUTS)    urologist-- dr Diona Fanti  . Chronic anticoagulation    Xarelto for afib  . Chronic constipation   . Chronic cystitis   . Gross hematuria   . History of DVT of lower extremity 06/08/2010   right femoral dvt dx post op total hip 05-08-2010  . History of gastric ulcer    remote  . History of kidney stones    one stone. remote hx  . Hyperlipidemia   . Hypertension    Dx by Dr. Lance Muss around age  85   . NICM (nonischemic cardiomyopathy) ([place])    per echo 06-11-2017,  ef 45-50% with diffuse hypokinesis,  G1DD  . OSA on CPAP 2017   compliant with CPAP.  managed by Dr Elsworth Soho.  . Osteoarthritis   . Other urethral stricture, male, meatal    chronic;  pt self dilates  . Paroxysmal atrial fibrillation [facility]) cardiologist-- dr Rayann Heman   first dx 2009/   a. documented by Dr Barrett Shell on office EKG 9/12. b. maintained on Tikosyn and Xarelto. c. s/p DCCV 's.  d.  x3  EP w/ ablation atrial fib last one 12-05-2016  . Prediabetes   . Premature ventricular contraction   . PVC's (premature ventricular contractions)   . RA (rheumatoid arthritis) [facility])    rheumatologist--- Dr Page Spiro,  treated with Humira  . RBBB (right bundle branch block)    Hx  of a fib, ablation Aug 2018  . Rheumatoid arthritis ([place])   . Vitamin D deficiency   . Wears hearing aid in both ears     Past Surgical History:  Procedure Laterality Date  . ABLATION OF DYSRHYTHMIC FOCUS  08/29/2015  . ATRIAL FIBRILLATION ABLATION  12/05/2016  . ATRIAL FIBRILLATION ABLATION N/A 12/05/2016   Procedure: Atrial Fibrillation Ablation;  Surgeon: [name], MD;  Location: [place] [facility];  Service: Cardiovascular;  Laterality: N/A;  . ATRIAL FLUTTER ABLATION  09/2010   by Greggory Brandy  . BUNIONECTOMY Right 1990s  . CARDIOVERSION N/A 10/28/2012   Procedure: CARDIOVERSION;  Surgeon: Burnell Blanks, MD;  Location: [place];  Service: Cardiovascular;  Laterality: N/A;  . CATARACT EXTRACTION W/ INTRAOCULAR LENS  IMPLANT, BILATERAL  2019  . CYSTOSCOPY WITH BIOPSY N/A 09/03/2018   Procedure: CYSTOSCOPY WITH FULGURATION [id number];  Surgeon: Franchot Gallo, MD;  [facility];  Service: Urology;  Laterality: N/A;  30 MINS  . ELECTROPHYSIOLOGIC STUDY N/A 08/29/2015   Procedure: Atrial Fibrillation Ablation;  Surgeon: [name], MD;  Location: [place] CV LAB;  Service: Cardiovascular;  Laterality: N/A;  . KNEE SURGERY Bilateral x3   1960s & 1970s   open  .  LEFT HEART CATHETERIZATION WITH CORONARY ANGIOGRAM N/A 06/07/2011   Procedure: LEFT HEART CATHETERIZATION WITH CORONARY ANGIOGRAM;  Surgeon: Peter M Martinique, MD;  Location: [facility] CATH LAB;  Service: Cardiovascular;  Laterality: N/A;    Normal coronary arteries,  low normal LVF  . TONSILLECTOMY AND ADENOIDECTOMY  age 85  . TOTAL HIP ARTHROPLASTY Right 06/04/10    dr Noemi Chapel  @[facility]   . TOTAL KNEE ARTHROPLASTY Bilateral right 1995;  left 1997  . TRANSURETHRAL RESECTION OF PROSTATE  06-29-2002   dr humphries  @[facility]   . TRANSURETHRAL RESECTION OF PROSTATE N/A 07/06/2018   Procedure: TRANSURETHRAL RESECTION OF THE PROSTATE (TURP);  Surgeon: Franchot Gallo, MD;  Location: [facility];  Service: Urology;  Laterality: N/A;  45  MINS    There were no vitals filed for this visit.  Subjective Assessment - 12/24/18 1506    Subjective  Patient reports that his neck is tight and tender today, feels like this is where the lightheadedness is coming from    Currently in Pain?  Yes    Pain Score  5     Pain Location  Neck    Pain Relieving Factors  I think the treatment helps some                       [facility] Adult PT Treatment/Exercise - 12/24/18 0001      Moist Heat Therapy   Number Minutes Moist Heat  10 Minutes    Moist Heat Location  Cervical      Electrical Stimulation   Electrical Stimulation Location  right cervical area    Electrical Stimulation Action  IFC    Electrical Stimulation Parameters  supine while on traction    Electrical Stimulation Goals  Pain      Traction   Type of Traction  Cervical    Min (lbs)  17    Hold Time  static    Time  14      Manual Therapy   Manual Therapy  Soft tissue mobilization    Manual therapy comments  some passive ROM to end range of cervical rotation and side bending    Soft tissue mobilization  STM to the right upper trap and right cervical parapsinals               PT Short Term Goals - 12/22/18 1525      PT SHORT TERM GOAL #1   Title  independent with initial HEP    Status  Achieved        PT Long Term Goals - 12/24/18 1511      PT LONG TERM GOAL #1   Title  understand posture and body mechanics    Status  On-going      PT LONG TERM GOAL #2   Title  increase cervical ROM 25%    Status  On-going      PT LONG TERM GOAL #3   Title  report 25% easier to back up the car    Status  On-going            Plan - 12/24/18 1509    Clinical Impression Statement  Patient did great last treatment with his balance so we focused more on the neck issues as he feels this is where the lightheadedness comes from.  He is very tight with spasms and trigger points present    PT Next Visit Plan  work on scapular stability and the  tension in  his neck    Consulted and Agree with Plan of Care  Patient       Patient will benefit from skilled therapeutic intervention in order to improve the following deficits and impairments:  Decreased range of motion, Increased muscle spasms, Impaired flexibility, Postural dysfunction, Improper body mechanics, Pain, Decreased strength, Decreased balance, Difficulty walking  Visit Diagnosis: Cervicalgia  Cramp and spasm  Dizziness and giddiness  Unsteadiness on feet     Problem List Patient Active Problem List   Diagnosis Date Noted  . Malignant neoplasm of prostate ([place]) 09/11/2018  . Enlarged prostate with urinary obstruction 07/06/2018  . Gastrocnemius strain, right, initial encounter 12/10/2017  . Baker's cyst of knee, right 12/10/2017  . Obesity 03/24/2017  . Paroxysmal atrial fibrillation ([place]) 12/05/2016  . Near syncope 04/20/2016  . Leg hematoma, left, initial encounter 04/20/2016  . Bleeding   . Ecchymosis   . Left hamstring muscle strain   . Muscle tear   . RBBB 08/30/2015  . A-fib ([place]) 08/29/2015  . Chronic systolic dysfunction of left ventricle 07/11/2014  . OSA (obstructive sleep apnea) 04/07/2013  . Multinodular goiter 01/20/2013  . Cervical spondylosis without myelopathy 01/18/2013  . Atrial fibrillation with RVR ([place]) 10/27/2012  . PAF (paroxysmal atrial fibrillation) ([place]) 05/28/2012  . Left ventricular dysfunction 12/17/2011  . Low back pain 09/11/2011  . Fatigue 05/22/2011  . PVC (premature ventricular contraction) 01/18/2011  . Persistent atrial fibrillation 01/12/2011  . Hyperlipidemia 08/02/2010  . Hypertension 08/02/2010  . Osteoarthritis 08/02/2010  . BPH (benign prostatic hyperplasia) 08/02/2010  . GERD (gastroesophageal reflux disease) 08/02/2010  . h/o Atrial flutter [facility]) s/p ablation 2012     Sumner Boast., PT 12/24/2018, 3:12 PM  [address]  [place], [place], [address] Phone: [phone]   Fax:  [phone]  Name: TYLIK TREESE MRN: [id number] Date of Birth: 09-05-1933

## 2018-12-25 ENCOUNTER — Other Ambulatory Visit: Payer: Self-pay | Admitting: Radiation Oncology

## 2018-12-25 ENCOUNTER — Telehealth: Payer: Self-pay

## 2018-12-25 ENCOUNTER — Ambulatory Visit
Admission: RE | Admit: 2018-12-25 | Discharge: 2018-12-25 | Disposition: A | Discharge status: Home / Self Care | Payer: Medicare Other | Source: Ambulatory Visit | Attending: Radiation Oncology | Admitting: Radiation Oncology

## 2018-12-25 ENCOUNTER — Other Ambulatory Visit: Payer: Self-pay

## 2018-12-25 DIAGNOSIS — Z510 Encounter for antineoplastic radiation therapy: Secondary | ICD-10-CM | POA: Diagnosis not present

## 2018-12-25 MED ORDER — TAMSULOSIN HCL 0.4 MG PO CAPS: 0.4000 mg | ORAL_CAPSULE | Freq: Every day | ORAL | 5 refills | Status: AC

## 2018-12-28 ENCOUNTER — Encounter: Payer: Self-pay | Admitting: Physical Therapy

## 2018-12-28 ENCOUNTER — Ambulatory Visit
Admission: RE | Admit: 2018-12-28 | Discharge: 2018-12-28 | Disposition: A | Discharge status: Home / Self Care | Payer: Medicare Other | Source: Ambulatory Visit | Attending: Radiation Oncology | Admitting: Radiation Oncology

## 2018-12-28 ENCOUNTER — Ambulatory Visit: Payer: Medicare Other | Admitting: Physical Therapy

## 2018-12-28 ENCOUNTER — Other Ambulatory Visit: Payer: Self-pay

## 2018-12-28 DIAGNOSIS — R42 Dizziness and giddiness: Secondary | ICD-10-CM

## 2018-12-28 DIAGNOSIS — M542 Cervicalgia: Secondary | ICD-10-CM | POA: Diagnosis not present

## 2018-12-28 DIAGNOSIS — R252 Cramp and spasm: Secondary | ICD-10-CM

## 2018-12-28 DIAGNOSIS — Z510 Encounter for antineoplastic radiation therapy: Secondary | ICD-10-CM | POA: Diagnosis not present

## 2018-12-28 DIAGNOSIS — R2681 Unsteadiness on feet: Secondary | ICD-10-CM

## 2018-12-28 NOTE — Therapy (Signed)
[address] [place], [place], [address] Phone: [phone]   Fax:  [phone]  Physical Therapy Treatment  Patient Details  Name: Connor [name] MRN: [id number] Date of Birth: 14-Feb-1934 Referring Provider (PT): [place]   Encounter Date: 12/28/2018  PT End of Session - 12/28/18 [id number]    Visit Number  5    Date for PT Re-Evaluation  02/01/19    PT Start Time  [id number]    PT Stop Time  1615    PT Time Calculation (min)  52 min    Activity Tolerance  Patient tolerated treatment well    Behavior During Therapy  [facility] for tasks assessed/performed       Past Medical History:  Diagnosis Date  . Benign localized prostatic hyperplasia with lower urinary tract symptoms (LUTS)    urologist-- dr Diona Fanti  . Chronic anticoagulation    Xarelto for afib  . Chronic constipation   . Chronic cystitis   . Gross hematuria   . History of DVT of lower extremity 06/08/2010   right femoral dvt dx post op total hip 05-08-2010  . History of gastric ulcer    remote  . History of kidney stones    one stone. remote hx  . Hyperlipidemia   . Hypertension    Dx by Dr. Lance Muss around age  57   . NICM (nonischemic cardiomyopathy) ([place])    per echo 06-11-2017,  ef 45-50% with diffuse hypokinesis,  G1DD  . OSA on CPAP 2017   compliant with CPAP.  managed by Dr Elsworth Soho.  . Osteoarthritis   . Other urethral stricture, male, meatal    chronic;  pt self dilates  . Paroxysmal atrial fibrillation [facility]) cardiologist-- dr Rayann Heman   first dx 2009/   a. documented by Dr Barrett Shell on office EKG 9/12. b. maintained on Tikosyn and Xarelto. c. s/p DCCV 's.  d.  x3  EP w/ ablation atrial fib last one 12-05-2016  . Prediabetes   . Premature ventricular contraction   . PVC's (premature ventricular contractions)   . RA (rheumatoid arthritis) [facility])    rheumatologist--- Dr Page Spiro,  treated with Humira  . RBBB (right bundle branch block)    Hx  of a fib, ablation Aug 2018  . Rheumatoid arthritis ([place])   . Vitamin D deficiency   . Wears hearing aid in both ears     Past Surgical History:  Procedure Laterality Date  . ABLATION OF DYSRHYTHMIC FOCUS  08/29/2015  . ATRIAL FIBRILLATION ABLATION  12/05/2016  . ATRIAL FIBRILLATION ABLATION N/A 12/05/2016   Procedure: Atrial Fibrillation Ablation;  Surgeon: [name], MD;  Location: [place] CV LAB;  Service: Cardiovascular;  Laterality: N/A;  . ATRIAL FLUTTER ABLATION  09/2010   by Greggory Brandy  . BUNIONECTOMY Right 1990s  . CARDIOVERSION N/A 10/28/2012   Procedure: CARDIOVERSION;  Surgeon: Burnell Blanks, MD;  Location: [place];  Service: Cardiovascular;  Laterality: N/A;  . CATARACT EXTRACTION W/ INTRAOCULAR LENS  IMPLANT, BILATERAL  2019  . CYSTOSCOPY WITH BIOPSY N/A 09/03/2018   Procedure: CYSTOSCOPY WITH FULGURATION [id number];  Surgeon: Franchot Gallo, MD;  [facility];  Service: Urology;  Laterality: N/A;  30 MINS  . ELECTROPHYSIOLOGIC STUDY N/A 08/29/2015   Procedure: Atrial Fibrillation Ablation;  Surgeon: [name], MD;  Location: [place] CV LAB;  Service: Cardiovascular;  Laterality: N/A;  . KNEE SURGERY Bilateral x3   1960s & 1970s   open  .  LEFT HEART CATHETERIZATION WITH CORONARY ANGIOGRAM N/A 06/07/2011   Procedure: LEFT HEART CATHETERIZATION WITH CORONARY ANGIOGRAM;  Surgeon: Peter M Martinique, MD;  Location: [facility] CATH LAB;  Service: Cardiovascular;  Laterality: N/A;    Normal coronary arteries,  low normal LVF  . TONSILLECTOMY AND ADENOIDECTOMY  age 83  . TOTAL HIP ARTHROPLASTY Right 06/04/10    dr Noemi Chapel  @[facility]   . TOTAL KNEE ARTHROPLASTY Bilateral right 1995;  left 1997  . TRANSURETHRAL RESECTION OF PROSTATE  06-29-2002   dr humphries  @[facility]   . TRANSURETHRAL RESECTION OF PROSTATE N/A 07/06/2018   Procedure: TRANSURETHRAL RESECTION OF THE PROSTATE (TURP);  Surgeon: Franchot Gallo, MD;  Location[facility];  Service: Urology;  Laterality: N/A;  45  MINS    There were no vitals filed for this visit.  Subjective Assessment - 12/28/18 1552    Subjective  I think everything centers on my neck tightness    Currently in Pain?  Yes    Pain Score  5     Pain Location  Neck    Pain Orientation  Right                       [facility] Adult PT Treatment/Exercise - 12/28/18 0001      Moist Heat Therapy   Number Minutes Moist Heat  10 Minutes    Moist Heat Location  Cervical      Electrical Stimulation   Electrical Stimulation Location  right cervical area    Electrical Stimulation Action  IFC    Electrical Stimulation Parameters  supine    Electrical Stimulation Goals  Pain      Traction   Type of Traction  Cervical    Min (lbs)  19    Hold Time  static    Time  12      Manual Therapy   Manual Therapy  Soft tissue mobilization    Manual therapy comments  some passive ROM to end range of cervical rotation and side bending    Soft tissue mobilization  STM to the right upper trap and right cervical parapsinals               PT Short Term Goals - 12/22/18 1525      PT SHORT TERM GOAL #1   Title  independent with initial HEP    Status  Achieved        PT Long Term Goals - 12/24/18 1511      PT LONG TERM GOAL #1   Title  understand posture and body mechanics    Status  On-going      PT LONG TERM GOAL #2   Title  increase cervical ROM 25%    Status  On-going      PT LONG TERM GOAL #3   Title  report 25% easier to back up the car    Status  On-going            Plan - 12/28/18 1553    Clinical Impression Statement  Patient with some knots in the right upper cervical area, really tried to work on these a little more aggressively, he feels that this is where the pain and dizziness emenate from    PT Next Visit Plan  work on scapular stability and the tension in his neck    Consulted and Agree with Plan of Care  Patient       Patient will benefit from skilled therapeutic intervention  in  order to improve the following deficits and impairments:  Decreased range of motion, Increased muscle spasms, Impaired flexibility, Postural dysfunction, Improper body mechanics, Pain, Decreased strength, Decreased balance, Difficulty walking  Visit Diagnosis: Cervicalgia  Cramp and spasm  Dizziness and giddiness  Unsteadiness on feet     Problem List Patient Active Problem List   Diagnosis Date Noted  . Malignant neoplasm of prostate ([place]) 09/11/2018  . Enlarged prostate with urinary obstruction 07/06/2018  . Gastrocnemius strain, right, initial encounter 12/10/2017  . Baker's cyst of knee, right 12/10/2017  . Obesity 03/24/2017  . Paroxysmal atrial fibrillation ([place]) 12/05/2016  . Near syncope 04/20/2016  . Leg hematoma, left, initial encounter 04/20/2016  . Bleeding   . Ecchymosis   . Left hamstring muscle strain   . Muscle tear   . RBBB 08/30/2015  . A-fib ([place]) 08/29/2015  . Chronic systolic dysfunction of left ventricle 07/11/2014  . OSA (obstructive sleep apnea) 04/07/2013  . Multinodular goiter 01/20/2013  . Cervical spondylosis without myelopathy 01/18/2013  . Atrial fibrillation with RVR ([place]) 10/27/2012  . PAF (paroxysmal atrial fibrillation) ([place]) 05/28/2012  . Left ventricular dysfunction 12/17/2011  . Low back pain 09/11/2011  . Fatigue 05/22/2011  . PVC (premature ventricular contraction) 01/18/2011  . Persistent atrial fibrillation 01/12/2011  . Hyperlipidemia 08/02/2010  . Hypertension 08/02/2010  . Osteoarthritis 08/02/2010  . BPH (benign prostatic hyperplasia) 08/02/2010  . GERD (gastroesophageal reflux disease) 08/02/2010  . h/o Atrial flutter [facility]) s/p ablation 2012     Sumner Boast., PT 12/28/2018, 3:55 PM  [place], [place], [address] Phone: [phone]   Fax:  [phone]  Name: LORRY IZQUIERDO MRN: [id number] Date of Birth: [birth year]

## 2018-12-29 ENCOUNTER — Other Ambulatory Visit: Payer: Self-pay

## 2018-12-29 ENCOUNTER — Ambulatory Visit
Admission: RE | Admit: 2018-12-29 | Discharge: 2018-12-29 | Disposition: A | Discharge status: Home / Self Care | Payer: Medicare Other | Source: Ambulatory Visit | Attending: Radiation Oncology | Admitting: Radiation Oncology

## 2018-12-29 DIAGNOSIS — Z510 Encounter for antineoplastic radiation therapy: Secondary | ICD-10-CM | POA: Diagnosis not present

## 2018-12-30 ENCOUNTER — Telehealth: Payer: Medicare Other | Admitting: Internal Medicine

## 2018-12-30 ENCOUNTER — Ambulatory Visit
Admission: RE | Admit: 2018-12-30 | Discharge: 2018-12-30 | Disposition: A | Discharge status: Home / Self Care | Payer: Medicare Other | Source: Ambulatory Visit | Attending: Radiation Oncology | Admitting: Radiation Oncology

## 2018-12-30 ENCOUNTER — Other Ambulatory Visit: Payer: Self-pay

## 2018-12-30 DIAGNOSIS — Z510 Encounter for antineoplastic radiation therapy: Secondary | ICD-10-CM | POA: Diagnosis not present

## 2018-12-31 ENCOUNTER — Ambulatory Visit: Payer: Medicare Other | Admitting: Physical Therapy

## 2018-12-31 ENCOUNTER — Other Ambulatory Visit: Payer: Self-pay

## 2018-12-31 ENCOUNTER — Ambulatory Visit
Admission: RE | Admit: 2018-12-31 | Discharge: 2018-12-31 | Disposition: A | Discharge status: Home / Self Care | Payer: Medicare Other | Source: Ambulatory Visit | Attending: Radiation Oncology | Admitting: Radiation Oncology

## 2018-12-31 DIAGNOSIS — Z510 Encounter for antineoplastic radiation therapy: Secondary | ICD-10-CM | POA: Diagnosis not present

## 2019-01-01 ENCOUNTER — Other Ambulatory Visit: Payer: Self-pay

## 2019-01-01 ENCOUNTER — Ambulatory Visit
Admission: RE | Admit: 2019-01-01 | Discharge: 2019-01-01 | Disposition: A | Discharge status: Home / Self Care | Payer: Medicare Other | Source: Ambulatory Visit | Attending: Radiation Oncology | Admitting: Radiation Oncology

## 2019-01-01 DIAGNOSIS — Z510 Encounter for antineoplastic radiation therapy: Secondary | ICD-10-CM | POA: Diagnosis not present

## 2019-01-04 ENCOUNTER — Ambulatory Visit
Admission: RE | Admit: 2019-01-04 | Discharge: 2019-01-04 | Disposition: A | Discharge status: Home / Self Care | Payer: Medicare Other | Source: Ambulatory Visit | Attending: Radiation Oncology | Admitting: Radiation Oncology

## 2019-01-04 ENCOUNTER — Other Ambulatory Visit: Payer: Self-pay

## 2019-01-04 ENCOUNTER — Encounter: Payer: Self-pay | Admitting: Family Medicine

## 2019-01-04 DIAGNOSIS — Z510 Encounter for antineoplastic radiation therapy: Secondary | ICD-10-CM | POA: Diagnosis not present

## 2019-01-04 DIAGNOSIS — D649 Anemia, unspecified: Secondary | ICD-10-CM

## 2019-01-04 DIAGNOSIS — D619 Aplastic anemia, unspecified: Secondary | ICD-10-CM

## 2019-01-05 ENCOUNTER — Encounter: Payer: Self-pay | Admitting: Physical Therapy

## 2019-01-05 ENCOUNTER — Ambulatory Visit
Admission: RE | Admit: 2019-01-05 | Discharge: 2019-01-05 | Disposition: A | Discharge status: Home / Self Care | Payer: Medicare Other | Source: Ambulatory Visit | Attending: Radiation Oncology | Admitting: Radiation Oncology

## 2019-01-05 ENCOUNTER — Other Ambulatory Visit: Payer: Self-pay

## 2019-01-05 ENCOUNTER — Ambulatory Visit: Payer: Medicare Other | Attending: Family Medicine | Admitting: Physical Therapy

## 2019-01-05 DIAGNOSIS — M542 Cervicalgia: Secondary | ICD-10-CM

## 2019-01-05 DIAGNOSIS — Z510 Encounter for antineoplastic radiation therapy: Secondary | ICD-10-CM | POA: Insufficient documentation

## 2019-01-05 DIAGNOSIS — C61 Malignant neoplasm of prostate: Secondary | ICD-10-CM | POA: Diagnosis not present

## 2019-01-05 DIAGNOSIS — R2681 Unsteadiness on feet: Secondary | ICD-10-CM | POA: Diagnosis present

## 2019-01-05 DIAGNOSIS — R42 Dizziness and giddiness: Secondary | ICD-10-CM

## 2019-01-05 DIAGNOSIS — R252 Cramp and spasm: Secondary | ICD-10-CM | POA: Diagnosis present

## 2019-01-05 NOTE — Therapy (Signed)
[address] [place], [place], [address] Phone: [phone]   Fax:  [phone]  Physical Therapy Treatment  Patient Details  Name: Connor [name] MRN: [id number] Date of Birth: [birth year] Referring Provider (PT): [place]   Encounter Date: 01/05/2019  PT End of Session - 01/05/19 1508    Visit Number  6    Date for PT Re-Evaluation  02/01/19    PT Start Time  1433    PT Stop Time  1526    PT Time Calculation (min)  53 min    Activity Tolerance  Patient tolerated treatment well    Behavior During Therapy  [facility] for tasks assessed/performed       Past Medical History:  Diagnosis Date  . Benign localized prostatic hyperplasia with lower urinary tract symptoms (LUTS)    urologist-- dr Diona Fanti  . Chronic anticoagulation    Xarelto for afib  . Chronic constipation   . Chronic cystitis   . Gross hematuria   . History of DVT of lower extremity 06/08/2010   right femoral dvt dx post op total hip 05-08-2010  . History of gastric ulcer    remote  . History of kidney stones    one stone. remote hx  . Hyperlipidemia   . Hypertension    Dx by Dr. Lance Muss around age  9   . NICM (nonischemic cardiomyopathy) ([place])    per echo 06-11-2017,  ef 45-50% with diffuse hypokinesis,  G1DD  . OSA on CPAP 2017   compliant with CPAP.  managed by Dr Elsworth Soho.  . Osteoarthritis   . Other urethral stricture, male, meatal    chronic;  pt self dilates  . Paroxysmal atrial fibrillation [facility]) cardiologist-- dr Rayann Heman   first dx 2009/   a. documented by Dr Barrett Shell on office EKG 9/12. b. maintained on Tikosyn and Xarelto. c. s/p DCCV 's.  d.  x3  EP w/ ablation atrial fib last one 12-05-2016  . Prediabetes   . Premature ventricular contraction   . PVC's (premature ventricular contractions)   . RA (rheumatoid arthritis) [facility])    rheumatologist--- Dr Page Spiro,  treated with Humira  . RBBB (right bundle branch block)    Hx  of a fib, ablation Aug 2018  . Rheumatoid arthritis ([place])   . Vitamin D deficiency   . Wears hearing aid in both ears     Past Surgical History:  Procedure Laterality Date  . ABLATION OF DYSRHYTHMIC FOCUS  08/29/2015  . ATRIAL FIBRILLATION ABLATION  12/05/2016  . ATRIAL FIBRILLATION ABLATION N/A 12/05/2016   Procedure: Atrial Fibrillation Ablation;  Surgeon: [name], MD;  Location: [place] CV LAB;  Service: Cardiovascular;  Laterality: N/A;  . ATRIAL FLUTTER ABLATION  09/2010   by Greggory Brandy  . BUNIONECTOMY Right 1990s  . CARDIOVERSION N/A 10/28/2012   Procedure: CARDIOVERSION;  Surgeon: Burnell Blanks, MD;  Location: [place];  Service: Cardiovascular;  Laterality: N/A;  . CATARACT EXTRACTION W/ INTRAOCULAR LENS  IMPLANT, BILATERAL  2019  . CYSTOSCOPY WITH BIOPSY N/A 09/03/2018   Procedure: CYSTOSCOPY WITH FULGURATION [id number];  Surgeon: Franchot Gallo, MD;  [facility];  Service: Urology;  Laterality: N/A;  30 MINS  . ELECTROPHYSIOLOGIC STUDY N/A 08/29/2015   Procedure: Atrial Fibrillation Ablation;  Surgeon: [name], MD;  Location: [place] CV LAB;  Service: Cardiovascular;  Laterality: N/A;  . KNEE SURGERY Bilateral x3   1960s & 1970s   open  .  LEFT HEART CATHETERIZATION WITH CORONARY ANGIOGRAM N/A 06/07/2011   Procedure: LEFT HEART CATHETERIZATION WITH CORONARY ANGIOGRAM;  Surgeon: Peter M Martinique, MD;  Location: [facility] CATH LAB;  Service: Cardiovascular;  Laterality: N/A;    Normal coronary arteries,  low normal LVF  . TONSILLECTOMY AND ADENOIDECTOMY  age 82  . TOTAL HIP ARTHROPLASTY Right 06/04/10    dr Noemi Chapel  @[facility]   . TOTAL KNEE ARTHROPLASTY Bilateral right 1995;  left 1997  . TRANSURETHRAL RESECTION OF PROSTATE  06-29-2002   dr humphries  @[facility]   . TRANSURETHRAL RESECTION OF PROSTATE N/A 07/06/2018   Procedure: TRANSURETHRAL RESECTION OF THE PROSTATE (TURP);  Surgeon: Franchot Gallo, MD;  Location: [facility];  Service: Urology;  Laterality: N/A;  45  MINS    There were no vitals filed for this visit.  Subjective Assessment - 01/05/19 1506    Subjective  I really think the treatment on my neck is what helps the pain and the dizziness, reports some issues with fatigue and reports that he was found to be low in iron    Currently in Pain?  Yes    Pain Score  6     Pain Location  Neck    Pain Orientation  Right    Pain Descriptors / Indicators  Spasm;Tightness                       [facility] Adult PT Treatment/Exercise - 01/05/19 0001      Moist Heat Therapy   Number Minutes Moist Heat  15 Minutes      Electrical Stimulation   Electrical Stimulation Location  right cervical area    Electrical Stimulation Action  IFC    Electrical Stimulation Parameters  supine    Electrical Stimulation Goals  Pain      Traction   Type of Traction  Cervical    Min (lbs)  19    Hold Time  static    Time  15      Manual Therapy   Manual Therapy  Soft tissue mobilization    Manual therapy comments  some passive ROM to end range of cervical rotation and side bending    Soft tissue mobilization  STM to the right upper trap and right cervical parapsinals, use of vibration and use of US/estim combo               PT Short Term Goals - 12/22/18 1525      PT SHORT TERM GOAL #1   Title  independent with initial HEP    Status  Achieved        PT Long Term Goals - 01/05/19 1513      PT LONG TERM GOAL #1   Title  understand posture and body mechanics    Status  Achieved            Plan - 01/05/19 1511    Clinical Impression Statement  Patient had reported neck pain and a feeling of unsteadiness, I really worked in the past on challenging his balance and he was solid, he then talked to me about the neck issues causing the unsteadiness, we have tried to focus on the neck tightness and spasms, he feels that this helps but also reports continued dizziness.    PT Next Visit Plan  see if our current treatment helps him, I  do the traction and the heat/estim at the same time    Consulted and Agree with Plan of Care  Patient       Patient will benefit from skilled therapeutic intervention in order to improve the following deficits and impairments:  Decreased range of motion, Increased muscle spasms, Impaired flexibility, Postural dysfunction, Improper body mechanics, Pain, Decreased strength, Decreased balance, Difficulty walking  Visit Diagnosis: Cervicalgia  Cramp and spasm  Dizziness and giddiness  Unsteadiness on feet     Problem List Patient Active Problem List   Diagnosis Date Noted  . Malignant neoplasm of prostate ([place]) 09/11/2018  . Enlarged prostate with urinary obstruction 07/06/2018  . Gastrocnemius strain, right, initial encounter 12/10/2017  . Baker's cyst of knee, right 12/10/2017  . Obesity 03/24/2017  . Paroxysmal atrial fibrillation ([place]) 12/05/2016  . Near syncope 04/20/2016  . Leg hematoma, left, initial encounter 04/20/2016  . Bleeding   . Ecchymosis   . Left hamstring muscle strain   . Muscle tear   . RBBB 08/30/2015  . A-fib ([place]) 08/29/2015  . Chronic systolic dysfunction of left ventricle 07/11/2014  . OSA (obstructive sleep apnea) 04/07/2013  . Multinodular goiter 01/20/2013  . Cervical spondylosis without myelopathy 01/18/2013  . Atrial fibrillation with RVR ([place]) 10/27/2012  . PAF (paroxysmal atrial fibrillation) ([place]) 05/28/2012  . Left ventricular dysfunction 12/17/2011  . Low back pain 09/11/2011  . Fatigue 05/22/2011  . PVC (premature ventricular contraction) 01/18/2011  . Persistent atrial fibrillation 01/12/2011  . Hyperlipidemia 08/02/2010  . Hypertension 08/02/2010  . Osteoarthritis 08/02/2010  . BPH (benign prostatic hyperplasia) 08/02/2010  . GERD (gastroesophageal reflux disease) 08/02/2010  . h/o Atrial flutter [facility]) s/p ablation 2012     Sumner Boast., PT 01/05/2019, 3:14 PM  [address] [place], [place], [address] Phone: [phone]   Fax:  [phone]  Name: Connor [name] MRN: [id number] Date of Birth: 1934/01/18

## 2019-01-06 ENCOUNTER — Other Ambulatory Visit (INDEPENDENT_AMBULATORY_CARE_PROVIDER_SITE_OTHER): Payer: Medicare Other

## 2019-01-06 ENCOUNTER — Ambulatory Visit
Admission: RE | Admit: 2019-01-06 | Discharge: 2019-01-06 | Disposition: A | Discharge status: Home / Self Care | Payer: Medicare Other | Source: Ambulatory Visit | Attending: Radiation Oncology | Admitting: Radiation Oncology

## 2019-01-06 ENCOUNTER — Other Ambulatory Visit: Payer: Self-pay

## 2019-01-06 ENCOUNTER — Encounter: Payer: Self-pay | Admitting: Urology

## 2019-01-06 DIAGNOSIS — D649 Anemia, unspecified: Secondary | ICD-10-CM

## 2019-01-06 DIAGNOSIS — Z510 Encounter for antineoplastic radiation therapy: Secondary | ICD-10-CM | POA: Diagnosis not present

## 2019-01-07 ENCOUNTER — Ambulatory Visit: Payer: Medicare Other | Admitting: Physical Therapy

## 2019-01-07 ENCOUNTER — Other Ambulatory Visit: Payer: Self-pay

## 2019-01-07 ENCOUNTER — Ambulatory Visit
Admission: RE | Admit: 2019-01-07 | Discharge: 2019-01-07 | Disposition: A | Discharge status: Home / Self Care | Payer: Medicare Other | Source: Ambulatory Visit | Attending: Radiation Oncology | Admitting: Radiation Oncology

## 2019-01-07 ENCOUNTER — Encounter: Payer: Self-pay | Admitting: Physical Therapy

## 2019-01-07 DIAGNOSIS — Z510 Encounter for antineoplastic radiation therapy: Secondary | ICD-10-CM | POA: Diagnosis not present

## 2019-01-07 DIAGNOSIS — R42 Dizziness and giddiness: Secondary | ICD-10-CM

## 2019-01-07 DIAGNOSIS — M542 Cervicalgia: Secondary | ICD-10-CM

## 2019-01-07 DIAGNOSIS — R252 Cramp and spasm: Secondary | ICD-10-CM

## 2019-01-07 LAB — FERRITIN: Ferritin: 13.1 ng/mL — ABNORMAL LOW (ref 22.0–322.0)

## 2019-01-07 NOTE — Therapy (Signed)
[address] [place], [place], [address] Phone: [phone]   Fax:  [phone]  Physical Therapy Treatment  Patient Details  Name: Connor [name] MRN: [id number] Date of Birth: 1933/06/28 Referring Provider (PT): [place]   Encounter Date: 01/07/2019  PT End of Session - 01/07/19 1609    Visit Number  7    Date for PT Re-Evaluation  02/01/19    PT Start Time  [id number]    PT Stop Time  [id number]    PT Time Calculation (min)  47 min       Past Medical History:  Diagnosis Date  . Benign localized prostatic hyperplasia with lower urinary tract symptoms (LUTS)    urologist-- dr Diona Fanti  . Chronic anticoagulation    Xarelto for afib  . Chronic constipation   . Chronic cystitis   . Gross hematuria   . History of DVT of lower extremity 06/08/2010   right femoral dvt dx post op total hip 05-08-2010  . History of gastric ulcer    remote  . History of kidney stones    one stone. remote hx  . Hyperlipidemia   . Hypertension    Dx by Dr. Lance Muss around age  85   . NICM (nonischemic cardiomyopathy) ([place])    per echo 06-11-2017,  ef 45-50% with diffuse hypokinesis,  G1DD  . OSA on CPAP 2017   compliant with CPAP.  managed by Dr Elsworth Soho.  . Osteoarthritis   . Other urethral stricture, male, meatal    chronic;  pt self dilates  . Paroxysmal atrial fibrillation [facility]) cardiologist-- dr Rayann Heman   first dx 2009/   a. documented by Dr Barrett Shell on office EKG 9/12. b. maintained on Tikosyn and Xarelto. c. s/p DCCV 's.  d.  x3  EP w/ ablation atrial fib last one 12-05-2016  . Prediabetes   . Premature ventricular contraction   . PVC's (premature ventricular contractions)   . RA (rheumatoid arthritis) [facility])    rheumatologist--- Dr Page Spiro,  treated with Humira  . RBBB (right bundle branch block)    Hx of a fib, ablation Aug 2018  . Rheumatoid arthritis ([place])   . Vitamin D deficiency   . Wears hearing aid in both  ears     Past Surgical History:  Procedure Laterality Date  . ABLATION OF DYSRHYTHMIC FOCUS  08/29/2015  . ATRIAL FIBRILLATION ABLATION  12/05/2016  . ATRIAL FIBRILLATION ABLATION N/A 12/05/2016   Procedure: Atrial Fibrillation Ablation;  Surgeon: [name], MD;  Location: [place] CV LAB;  Service: Cardiovascular;  Laterality: N/A;  . ATRIAL FLUTTER ABLATION  09/2010   by Greggory Brandy  . BUNIONECTOMY Right 1990s  . CARDIOVERSION N/A 10/28/2012   Procedure: CARDIOVERSION;  Surgeon: Burnell Blanks, MD;  Location: [place];  Service: Cardiovascular;  Laterality: N/A;  . CATARACT EXTRACTION W/ INTRAOCULAR LENS  IMPLANT, BILATERAL  2019  . CYSTOSCOPY WITH BIOPSY N/A 09/03/2018   Procedure: CYSTOSCOPY WITH FULGURATION [id number];  Surgeon: Franchot Gallo, MD;  [facility];  Service: Urology;  Laterality: N/A;  30 MINS  . ELECTROPHYSIOLOGIC STUDY N/A 08/29/2015   Procedure: Atrial Fibrillation Ablation;  Surgeon: [name], MD;  Location: [place] CV LAB;  Service: Cardiovascular;  Laterality: N/A;  . KNEE SURGERY Bilateral x3   1960s & 1970s   open  . LEFT HEART CATHETERIZATION WITH CORONARY ANGIOGRAM N/A 06/07/2011   Procedure: LEFT HEART CATHETERIZATION WITH CORONARY ANGIOGRAM;  Surgeon:  Peter M Martinique, MD;  Location: [facility] CATH LAB;  Service: Cardiovascular;  Laterality: N/A;    Normal coronary arteries,  low normal LVF  . TONSILLECTOMY AND ADENOIDECTOMY  age 85  . TOTAL HIP ARTHROPLASTY Right 06/04/10    dr Noemi Chapel  @[facility]   . TOTAL KNEE ARTHROPLASTY Bilateral right 1995;  left 1997  . TRANSURETHRAL RESECTION OF PROSTATE  06-29-2002   dr humphries  @[facility]   . TRANSURETHRAL RESECTION OF PROSTATE N/A 07/06/2018   Procedure: TRANSURETHRAL RESECTION OF THE PROSTATE (TURP);  Surgeon: Franchot Gallo, MD;  Location: [facility];  Service: Urology;  Laterality: N/A;  45 MINS    There were no vitals filed for this visit.  Subjective Assessment - 01/07/19 1531    Subjective  no  reports of pain, radiation treatment today fatigues easily because of it. tension R side of neck.    Currently in Pain?  No/denies    Pain Score  0-No pain                       [facility] Adult PT Treatment/Exercise - 01/07/19 0001      Neck Exercises: Machines for Strengthening   UBE (Upper Arm Bike)  lvl 3 44fwd/3bck      Neck Exercises: Seated   Other Seated Exercise  Shoulder shrugs 2x15 4#;       Moist Heat Therapy   Number Minutes Moist Heat  15 Minutes    Moist Heat Location  Shoulder;Cervical   Rt. shoulder     Electrical Stimulation   Electrical Stimulation Location  right cervical area    Electrical Stimulation Action  IFC    Electrical Stimulation Parameters  sitting    Electrical Stimulation Goals  Pain      Manual Therapy   Manual Therapy  Soft tissue mobilization    Manual therapy comments  some passive ROM to end range of cervical rotation and side bending    Soft tissue mobilization  STM to the right upper trap and right cervical parapsinals, use of vibration and use of US/estim combo   significant knots in trapezius, and scalenes              PT Short Term Goals - 12/22/18 1525      PT SHORT TERM GOAL #1   Title  independent with initial HEP    Status  Achieved        PT Long Term Goals - 01/05/19 1513      PT LONG TERM GOAL #1   Title  understand posture and body mechanics    Status  Achieved            Plan - 01/07/19 1610    Clinical Impression Statement  Pt. reported no pain during exercises however trigger points in trapezius and scalenes elicited pain. Pt will continue to benefit from PT to address tension in Rt side of neck causing unsteadiness and ROM difficulties    PT Treatment/Interventions  Cryotherapy;Electrical Stimulation;Moist Heat;Traction;Ultrasound;Patient/family education;Therapeutic exercise;Therapeutic activities;Manual techniques;Functional mobility training;Balance training;Neuromuscular  re-education;Dry needling    PT Next Visit Plan  Continue to address neck tension       Patient will benefit from skilled therapeutic intervention in order to improve the following deficits and impairments:  Decreased range of motion, Increased muscle spasms, Impaired flexibility, Postural dysfunction, Improper body mechanics, Pain, Decreased strength, Decreased balance, Difficulty walking  Visit Diagnosis: Cervicalgia  Cramp and spasm  Dizziness and giddiness  Problem List Patient Active Problem List   Diagnosis Date Noted  . Malignant neoplasm of prostate ([place]) 09/11/2018  . Enlarged prostate with urinary obstruction 07/06/2018  . Gastrocnemius strain, right, initial encounter 12/10/2017  . Baker's cyst of knee, right 12/10/2017  . Obesity 03/24/2017  . Paroxysmal atrial fibrillation ([place]) 12/05/2016  . Near syncope 04/20/2016  . Leg hematoma, left, initial encounter 04/20/2016  . Bleeding   . Ecchymosis   . Left hamstring muscle strain   . Muscle tear   . RBBB 08/30/2015  . A-fib ([place]) 08/29/2015  . Chronic systolic dysfunction of left ventricle 07/11/2014  . OSA (obstructive sleep apnea) 04/07/2013  . Multinodular goiter 01/20/2013  . Cervical spondylosis without myelopathy 01/18/2013  . Atrial fibrillation with RVR ([place]) 10/27/2012  . PAF (paroxysmal atrial fibrillation) ([place]) 05/28/2012  . Left ventricular dysfunction 12/17/2011  . Low back pain 09/11/2011  . Fatigue 05/22/2011  . PVC (premature ventricular contraction) 01/18/2011  . Persistent atrial fibrillation 01/12/2011  . Hyperlipidemia 08/02/2010  . Hypertension 08/02/2010  . Osteoarthritis 08/02/2010  . BPH (benign prostatic hyperplasia) 08/02/2010  . GERD (gastroesophageal reflux disease) 08/02/2010  . h/o Atrial flutter [facility]) s/p ablation 2012     Dylan Brina Umeda, SPTA 01/07/2019, 4:13 PM  [place] [place], [place],  [address] Phone: [phone]   Fax:  [phone]  Name: TAJUAN BREITBACH MRN: [id number] Date of Birth: January 31, 1934

## 2019-01-08 ENCOUNTER — Telehealth: Payer: Self-pay | Admitting: Physician Assistant

## 2019-01-08 ENCOUNTER — Ambulatory Visit
Admission: RE | Admit: 2019-01-08 | Discharge: 2019-01-08 | Disposition: A | Discharge status: Home / Self Care | Payer: Medicare Other | Source: Ambulatory Visit | Attending: Radiation Oncology | Admitting: Radiation Oncology

## 2019-01-08 ENCOUNTER — Other Ambulatory Visit: Payer: Self-pay

## 2019-01-08 ENCOUNTER — Encounter: Payer: Self-pay | Admitting: Family Medicine

## 2019-01-08 DIAGNOSIS — Z510 Encounter for antineoplastic radiation therapy: Secondary | ICD-10-CM | POA: Diagnosis not present

## 2019-01-08 NOTE — Telephone Encounter (Signed)
   The patient called the answering service after-hours today. Chart reviewed. Returned call - patient had robo-call screening service on his return number so did not immediately ring to patient. He checked his HR as he does routinely earlier and it was 60. He became concerned because he does not usually see numbers that low. He otherwise was totally asymptomatic. After he made the call, he went to check vitals again and noticed HR was 82-83. He rechecked his HR with his Kardia monitor and the HR was also in the 80s. He is unsure if the first reading was accurate, especially since feeling totally fine. I advised him to continue periodically checking his HR over the next day and let us know if he notices any further readings <50bpm. Also reviewed symptoms of bradycardia and warnings signs. The patient verbalized understanding and gratitude.  Charlie Pitter

## 2019-01-11 ENCOUNTER — Encounter: Payer: Self-pay | Admitting: Urology

## 2019-01-12 ENCOUNTER — Ambulatory Visit
Admission: RE | Admit: 2019-01-12 | Discharge: 2019-01-12 | Disposition: A | Discharge status: Home / Self Care | Payer: Medicare Other | Source: Ambulatory Visit | Attending: Radiation Oncology | Admitting: Radiation Oncology

## 2019-01-12 ENCOUNTER — Encounter: Payer: Self-pay | Admitting: Radiation Oncology

## 2019-01-12 ENCOUNTER — Other Ambulatory Visit: Payer: Self-pay

## 2019-01-12 DIAGNOSIS — Z510 Encounter for antineoplastic radiation therapy: Secondary | ICD-10-CM | POA: Diagnosis not present

## 2019-01-12 NOTE — Progress Notes (Signed)
  Radiation Oncology         [phone]) [phone] ________________________________  Name: Connor [name] MRN: [id number]  Date: 01/12/2019  DOB: Aug 18, 1933  End of Treatment Note  Diagnosis:   85 y.o. gentleman with Stage T1b adenocarcinoma of the prostate with Gleason score of 4+4, and oligometastatic disease in a right external iliac lymph node with a PSA of 2.6 (5.2 adjusted for finasteride).     Indication for treatment:  Curative, Definitive Radiotherapy       Radiation treatment dates:   11/17/18-01/12/19  Site/dose:  1. The prostate, seminal vesicles, and pelvic lymph nodes were initially treated to 45 Gy in 25 fractions of 1.8 Gy  2. The prostate and positive lymph node were boosted to 75 Gy with 15 additional fractions of 2.0 Gy   Beams/energy:  1. The prostate, seminal vesicles, and pelvic lymph nodes were initially treated using VMAT intensity modulated radiotherapy delivering 6 megavolt photons. Image guidance was performed with CB-CT studies prior to each fraction. He was immobilized with a body fix lower extremity mold.  2. the prostate only was boosted using VMAT intensity modulated radiotherapy delivering 6 megavolt photons. Image guidance was performed with CB-CT studies prior to each fraction. He was immobilized with a body fix lower extremity mold.  Narrative: The patient tolerated radiation treatment relatively well.   The patient experienced some minor urinary irritation and modest fatigue.  Patient reported nocturia up to 7 times nightly. Patient denied any issues with his bowels. Patient reported moderate fatigue. Patient reported some dysuria. Patient denied any hematuria. Patient stated that he empties his bladder sometime Patient reported a moderate stream Patient denied any leakage .Reported some urgency  Plan: The patient has completed radiation treatment. He will return to radiation oncology clinic for routine followup in one month. I advised him to call or return sooner  if he has any questions or concerns related to his recovery or treatment. ________________________________  Sheral Apley. Tammi Klippel, M.D.

## 2019-01-13 ENCOUNTER — Ambulatory Visit: Payer: Medicare Other | Admitting: Physical Therapy

## 2019-01-13 ENCOUNTER — Encounter: Payer: Self-pay | Admitting: Physical Therapy

## 2019-01-13 DIAGNOSIS — R252 Cramp and spasm: Secondary | ICD-10-CM

## 2019-01-13 DIAGNOSIS — R2681 Unsteadiness on feet: Secondary | ICD-10-CM

## 2019-01-13 DIAGNOSIS — M542 Cervicalgia: Secondary | ICD-10-CM | POA: Diagnosis not present

## 2019-01-13 DIAGNOSIS — R42 Dizziness and giddiness: Secondary | ICD-10-CM

## 2019-01-13 NOTE — Therapy (Signed)
[address] [place], [place], [address] Phone: [phone]   Fax:  [phone]  Physical Therapy Treatment  Patient Details  Name: Connor [name] MRN: [id number] Date of Birth: Sep 22, 1933 Referring Provider (PT): [place]   Encounter Date: 01/13/2019  PT End of Session - 01/13/19 1610    Visit Number  8    Date for PT Re-Evaluation  02/01/19    PT Start Time  1525    PT Stop Time  1610    PT Time Calculation (min)  45 min    Activity Tolerance  Patient tolerated treatment well    Behavior During Therapy  [facility] for tasks assessed/performed       Past Medical History:  Diagnosis Date  . Benign localized prostatic hyperplasia with lower urinary tract symptoms (LUTS)    urologist-- dr Diona Fanti  . Chronic anticoagulation    Xarelto for afib  . Chronic constipation   . Chronic cystitis   . Gross hematuria   . History of DVT of lower extremity 06/08/2010   right femoral dvt dx post op total hip 05-08-2010  . History of gastric ulcer    remote  . History of kidney stones    one stone. remote hx  . Hyperlipidemia   . Hypertension    Dx by Dr. Lance Muss around age  87   . NICM (nonischemic cardiomyopathy) ([place])    per echo 06-11-2017,  ef 45-50% with diffuse hypokinesis,  G1DD  . OSA on CPAP 2017   compliant with CPAP.  managed by Dr Elsworth Soho.  . Osteoarthritis   . Other urethral stricture, male, meatal    chronic;  pt self dilates  . Paroxysmal atrial fibrillation [facility]) cardiologist-- dr Rayann Heman   first dx 2009/   a. documented by Dr Barrett Shell on office EKG 9/12. b. maintained on Tikosyn and Xarelto. c. s/p DCCV 's.  d.  x3  EP w/ ablation atrial fib last one 12-05-2016  . Prediabetes   . Premature ventricular contraction   . PVC's (premature ventricular contractions)   . RA (rheumatoid arthritis) [facility])    rheumatologist--- Dr Page Spiro,  treated with Humira  . RBBB (right bundle branch block)    Hx  of a fib, ablation Aug 2018  . Rheumatoid arthritis ([place])   . Vitamin D deficiency   . Wears hearing aid in both ears     Past Surgical History:  Procedure Laterality Date  . ABLATION OF DYSRHYTHMIC FOCUS  08/29/2015  . ATRIAL FIBRILLATION ABLATION  12/05/2016  . ATRIAL FIBRILLATION ABLATION N/A 12/05/2016   Procedure: Atrial Fibrillation Ablation;  Surgeon: [name], MD;  Location: [place] CV LAB;  Service: Cardiovascular;  Laterality: N/A;  . ATRIAL FLUTTER ABLATION  09/2010   by Greggory Brandy  . BUNIONECTOMY Right 1990s  . CARDIOVERSION N/A 10/28/2012   Procedure: CARDIOVERSION;  Surgeon: Burnell Blanks, MD;  Location: [place];  Service: Cardiovascular;  Laterality: N/A;  . CATARACT EXTRACTION W/ INTRAOCULAR LENS  IMPLANT, BILATERAL  2019  . CYSTOSCOPY WITH BIOPSY N/A 09/03/2018   Procedure: CYSTOSCOPY WITH FULGURATION [id number];  Surgeon: Franchot Gallo, MD;  [facility];  Service: Urology;  Laterality: N/A;  30 MINS  . ELECTROPHYSIOLOGIC STUDY N/A 08/29/2015   Procedure: Atrial Fibrillation Ablation;  Surgeon: [name], MD;  Location: [place] CV LAB;  Service: Cardiovascular;  Laterality: N/A;  . KNEE SURGERY Bilateral x3   1960s & 1970s   open  .  LEFT HEART CATHETERIZATION WITH CORONARY ANGIOGRAM N/A 06/07/2011   Procedure: LEFT HEART CATHETERIZATION WITH CORONARY ANGIOGRAM;  Surgeon: Peter M Martinique, MD;  Location: [facility] CATH LAB;  Service: Cardiovascular;  Laterality: N/A;    Normal coronary arteries,  low normal LVF  . TONSILLECTOMY AND ADENOIDECTOMY  age 81  . TOTAL HIP ARTHROPLASTY Right 06/04/10    dr Noemi Chapel  _0   . TOTAL KNEE ARTHROPLASTY Bilateral right 1995;  left 1997  . TRANSURETHRAL RESECTION OF PROSTATE  06-29-2002   dr humphries  _1   . TRANSURETHRAL RESECTION OF PROSTATE N/A 07/06/2018   Procedure: TRANSURETHRAL RESECTION OF THE PROSTATE (TURP);  Surgeon: Franchot Gallo, MD;  Location: [facility];  Service: Urology;  Laterality: N/A;  45  MINS    There were no vitals filed for this visit.  Subjective Assessment - 01/13/19 1600    Subjective  Patient reports that he was very sore after the last treatment.    Currently in Pain?  Yes    Pain Score  3     Pain Location  Neck    Pain Orientation  Right    Pain Descriptors / Indicators  Sore    Aggravating Factors   unsure                       [facility] Adult PT Treatment/Exercise - 01/13/19 0001      Moist Heat Therapy   Number Minutes Moist Heat  15 Minutes    Moist Heat Location  Shoulder;Cervical      Electrical Stimulation   Electrical Stimulation Location  right cervical area    Electrical Stimulation Action  IFC    Electrical Stimulation Parameters  supine    Electrical Stimulation Goals  Pain      Manual Therapy   Manual Therapy  Soft tissue mobilization    Manual therapy comments  some passive ROM to end range of cervical rotation and side bending    Soft tissue mobilization  worked more on a deep knot in the right lateral and anterior area that is veyr tender               PT Short Term Goals - 12/22/18 1525      PT SHORT TERM GOAL #1   Title  independent with initial HEP    Status  Achieved        PT Long Term Goals - 01/13/19 1611      PT LONG TERM GOAL #1   Title  understand posture and body mechanics    Status  Achieved      PT LONG TERM GOAL #2   Title  increase cervical ROM 25%    Status  Partially Met      PT LONG TERM GOAL #3   Title  report 25% easier to back up the car    Status  Partially Met            Plan - 01/13/19 1610    Clinical Impression Statement  Patient has a small and very tender knot in the right anterior/lateral cervical area, he reports that "that is the spot", so I worked on this area, after he said he felt really good    PT Next Visit Plan  Continue to address neck tension    Consulted and Agree with Plan of Care  Patient       Patient will benefit from skilled therapeutic  intervention in order to improve the following  deficits and impairments:  Decreased range of motion, Increased muscle spasms, Impaired flexibility, Postural dysfunction, Improper body mechanics, Pain, Decreased strength, Decreased balance, Difficulty walking  Visit Diagnosis: Cervicalgia  Cramp and spasm  Dizziness and giddiness  Unsteadiness on feet     Problem List Patient Active Problem List   Diagnosis Date Noted  . Malignant neoplasm of prostate ([place]) 09/11/2018  . Enlarged prostate with urinary obstruction 07/06/2018  . Gastrocnemius strain, right, initial encounter 12/10/2017  . Baker's cyst of knee, right 12/10/2017  . Obesity 03/24/2017  . Paroxysmal atrial fibrillation ([place]) 12/05/2016  . Near syncope 04/20/2016  . Leg hematoma, left, initial encounter 04/20/2016  . Bleeding   . Ecchymosis   . Left hamstring muscle strain   . Muscle tear   . RBBB 08/30/2015  . A-fib ([place]) 08/29/2015  . Chronic systolic dysfunction of left ventricle 07/11/2014  . OSA (obstructive sleep apnea) 04/07/2013  . Multinodular goiter 01/20/2013  . Cervical spondylosis without myelopathy 01/18/2013  . Atrial fibrillation with RVR ([place]) 10/27/2012  . PAF (paroxysmal atrial fibrillation) ([place]) 05/28/2012  . Left ventricular dysfunction 12/17/2011  . Low back pain 09/11/2011  . Fatigue 05/22/2011  . PVC (premature ventricular contraction) 01/18/2011  . Persistent atrial fibrillation 01/12/2011  . Hyperlipidemia 08/02/2010  . Hypertension 08/02/2010  . Osteoarthritis 08/02/2010  . BPH (benign prostatic hyperplasia) 08/02/2010  . GERD (gastroesophageal reflux disease) 08/02/2010  . h/o Atrial flutter [facility]) s/p ablation 2012     Sumner Boast., PT 01/13/2019, 4:12 PM  [address] [place], [place], [address] Phone: [phone]   Fax:  [phone]  Name: Connor [name] MRN: [id number] Date of Birth:  Dec 09, 1933

## 2019-01-20 ENCOUNTER — Ambulatory Visit: Payer: Medicare Other | Admitting: Physical Therapy

## 2019-01-20 ENCOUNTER — Other Ambulatory Visit: Payer: Self-pay

## 2019-01-20 ENCOUNTER — Encounter: Payer: Self-pay | Admitting: Physical Therapy

## 2019-01-20 DIAGNOSIS — R2681 Unsteadiness on feet: Secondary | ICD-10-CM

## 2019-01-20 DIAGNOSIS — R42 Dizziness and giddiness: Secondary | ICD-10-CM

## 2019-01-20 DIAGNOSIS — R252 Cramp and spasm: Secondary | ICD-10-CM

## 2019-01-20 DIAGNOSIS — M542 Cervicalgia: Secondary | ICD-10-CM

## 2019-01-20 NOTE — Therapy (Signed)
[address] [place], [place], [address] Phone: [phone]   Fax:  [phone]  Physical Therapy Treatment  Patient Details  Name: Connor [name] MRN: [id number] Date of Birth: 07/06/1933 Referring Provider (PT): [place]   Encounter Date: 01/20/2019  PT End of Session - 01/20/19 1511    Visit Number  9    Date for PT Re-Evaluation  02/01/19    PT Start Time  3329    PT Stop Time  1527    PT Time Calculation (min)  44 min    Activity Tolerance  Patient tolerated treatment well       Past Medical History:  Diagnosis Date  . Benign localized prostatic hyperplasia with lower urinary tract symptoms (LUTS)    urologist-- dr Diona Fanti  . Chronic anticoagulation    Xarelto for afib  . Chronic constipation   . Chronic cystitis   . Gross hematuria   . History of DVT of lower extremity 06/08/2010   right femoral dvt dx post op total hip 05-08-2010  . History of gastric ulcer    remote  . History of kidney stones    one stone. remote hx  . Hyperlipidemia   . Hypertension    Dx by Dr. Lance Muss around age  23   . NICM (nonischemic cardiomyopathy) ([place])    per echo 06-11-2017,  ef 45-50% with diffuse hypokinesis,  G1DD  . OSA on CPAP 2017   compliant with CPAP.  managed by Dr Elsworth Soho.  . Osteoarthritis   . Other urethral stricture, male, meatal    chronic;  pt self dilates  . Paroxysmal atrial fibrillation [facility]) cardiologist-- dr Rayann Heman   first dx 2009/   a. documented by Dr Barrett Shell on office EKG 9/12. b. maintained on Tikosyn and Xarelto. c. s/p DCCV 's.  d.  x3  EP w/ ablation atrial fib last one 12-05-2016  . Prediabetes   . Premature ventricular contraction   . PVC's (premature ventricular contractions)   . RA (rheumatoid arthritis) [facility])    rheumatologist--- Dr Page Spiro,  treated with Humira  . RBBB (right bundle branch block)    Hx of a fib, ablation Aug 2018  . Rheumatoid arthritis ([place])    . Vitamin D deficiency   . Wears hearing aid in both ears     Past Surgical History:  Procedure Laterality Date  . ABLATION OF DYSRHYTHMIC FOCUS  08/29/2015  . ATRIAL FIBRILLATION ABLATION  12/05/2016  . ATRIAL FIBRILLATION ABLATION N/A 12/05/2016   Procedure: Atrial Fibrillation Ablation;  Surgeon: [name], MD;  Location: [place] CV LAB;  Service: Cardiovascular;  Laterality: N/A;  . ATRIAL FLUTTER ABLATION  09/2010   by Greggory Brandy  . BUNIONECTOMY Right 1990s  . CARDIOVERSION N/A 10/28/2012   Procedure: CARDIOVERSION;  Surgeon: Burnell Blanks, MD;  Location: [place];  Service: Cardiovascular;  Laterality: N/A;  . CATARACT EXTRACTION W/ INTRAOCULAR LENS  IMPLANT, BILATERAL  2019  . CYSTOSCOPY WITH BIOPSY N/A 09/03/2018   Procedure: CYSTOSCOPY WITH FULGURATION [id number];  Surgeon: Franchot Gallo, MD;  [facility];  Service: Urology;  Laterality: N/A;  30 MINS  . ELECTROPHYSIOLOGIC STUDY N/A 08/29/2015   Procedure: Atrial Fibrillation Ablation;  Surgeon: [name], MD;  Location: [place] CV LAB;  Service: Cardiovascular;  Laterality: N/A;  . KNEE SURGERY Bilateral x3   1960s & 1970s   open  . LEFT HEART CATHETERIZATION WITH CORONARY ANGIOGRAM N/A 06/07/2011  Procedure: LEFT HEART CATHETERIZATION WITH CORONARY ANGIOGRAM;  Surgeon: Peter M Martinique, MD;  Location: [facility] CATH LAB;  Service: Cardiovascular;  Laterality: N/A;    Normal coronary arteries,  low normal LVF  . TONSILLECTOMY AND ADENOIDECTOMY  age 66  . TOTAL HIP ARTHROPLASTY Right 06/04/10    dr Noemi Chapel  '@[facility]'   . TOTAL KNEE ARTHROPLASTY Bilateral right 1995;  left 1997  . TRANSURETHRAL RESECTION OF PROSTATE  06-29-2002   dr humphries  '@[facility]'   . TRANSURETHRAL RESECTION OF PROSTATE N/A 07/06/2018   Procedure: TRANSURETHRAL RESECTION OF THE PROSTATE (TURP);  Surgeon: Franchot Gallo, MD;  Location: [facility];  Service: Urology;  Laterality: N/A;  45 MINS    There were no vitals filed for this  visit.  Subjective Assessment - 01/20/19 1506    Subjective  Patient reports that he has been feeling better, less pain and dizziness    Currently in Pain?  Yes    Pain Score  2     Pain Location  Neck    Pain Orientation  Right    Pain Descriptors / Indicators  Tightness    Pain Relieving Factors  treatment helps                       [facility] Adult PT Treatment/Exercise - 01/20/19 0001      Moist Heat Therapy   Number Minutes Moist Heat  15 Minutes    Moist Heat Location  Shoulder;Cervical      Electrical Stimulation   Electrical Stimulation Location  right cervical area    Electrical Stimulation Action  IFC    Electrical Stimulation Parameters  supine    Electrical Stimulation Goals  Pain      Traction   Type of Traction  Cervical    Min (lbs)  19    Hold Time  static    Time  15      Manual Therapy   Manual Therapy  Soft tissue mobilization    Manual therapy comments  some passive ROM to end range of cervical rotation and side bending    Soft tissue mobilization  worked more on a deep knot in the right lateral and anterior area that is veyr tender               PT Short Term Goals - 12/22/18 1525      PT SHORT TERM GOAL #1   Title  independent with initial HEP    Status  Achieved        PT Long Term Goals - 01/20/19 1514      PT LONG TERM GOAL #1   Title  understand posture and body mechanics    Status  Achieved      PT LONG TERM GOAL #2   Title  increase cervical ROM 25%    Status  Partially Met      PT LONG TERM GOAL #3   Title  report 25% easier to back up the car    Status  Partially Met      PT LONG TERM GOAL #4   Title  decrease pain 50%    Status  Partially Met            Plan - 01/20/19 1513    Clinical Impression Statement  Patient is doing much better overall, less pain, less dizziness.  He is still tight but less knots, there is still the small knot in the right side of the  neck, traps feel looser.  Reports  only occasional unsteady gait    PT Next Visit Plan  see a few more weeks and may look at [place] and Agree with Plan of Care  Patient       Patient will benefit from skilled therapeutic intervention in order to improve the following deficits and impairments:  Decreased range of motion, Increased muscle spasms, Impaired flexibility, Postural dysfunction, Improper body mechanics, Pain, Decreased strength, Decreased balance, Difficulty walking  Visit Diagnosis: Cervicalgia  Cramp and spasm  Dizziness and giddiness  Unsteadiness on feet     Problem List Patient Active Problem List   Diagnosis Date Noted  . Malignant neoplasm of prostate ([place]) 09/11/2018  . Enlarged prostate with urinary obstruction 07/06/2018  . Gastrocnemius strain, right, initial encounter 12/10/2017  . Baker's cyst of knee, right 12/10/2017  . Obesity 03/24/2017  . Paroxysmal atrial fibrillation ([place]) 12/05/2016  . Near syncope 04/20/2016  . Leg hematoma, left, initial encounter 04/20/2016  . Bleeding   . Ecchymosis   . Left hamstring muscle strain   . Muscle tear   . RBBB 08/30/2015  . A-fib ([place]) 08/29/2015  . Chronic systolic dysfunction of left ventricle 07/11/2014  . OSA (obstructive sleep apnea) 04/07/2013  . Multinodular goiter 01/20/2013  . Cervical spondylosis without myelopathy 01/18/2013  . Atrial fibrillation with RVR ([place]) 10/27/2012  . PAF (paroxysmal atrial fibrillation) ([place]) 05/28/2012  . Left ventricular dysfunction 12/17/2011  . Low back pain 09/11/2011  . Fatigue 05/22/2011  . PVC (premature ventricular contraction) 01/18/2011  . Persistent atrial fibrillation 01/12/2011  . Hyperlipidemia 08/02/2010  . Hypertension 08/02/2010  . Osteoarthritis 08/02/2010  . BPH (benign prostatic hyperplasia) 08/02/2010  . GERD (gastroesophageal reflux disease) 08/02/2010  . h/o Atrial flutter [facility]) s/p ablation 2012     , W., PT 01/20/2019, 3:16 PM  [address] [place], [place], [address] Phone: [phone]   Fax:  [phone]  Name: Connor [name] MRN: [id number] Date of Birth: 1934/01/21

## 2019-01-26 ENCOUNTER — Encounter: Payer: Self-pay | Admitting: Physical Therapy

## 2019-01-26 ENCOUNTER — Other Ambulatory Visit: Payer: Self-pay

## 2019-01-26 ENCOUNTER — Ambulatory Visit: Payer: Medicare Other | Admitting: Physical Therapy

## 2019-01-26 DIAGNOSIS — R42 Dizziness and giddiness: Secondary | ICD-10-CM

## 2019-01-26 DIAGNOSIS — M542 Cervicalgia: Secondary | ICD-10-CM | POA: Diagnosis not present

## 2019-01-26 DIAGNOSIS — R2681 Unsteadiness on feet: Secondary | ICD-10-CM

## 2019-01-26 DIAGNOSIS — R252 Cramp and spasm: Secondary | ICD-10-CM

## 2019-01-26 NOTE — Therapy (Signed)
[address] [place], [place], [address] Phone: [phone]   Fax:  [phone] Progress Note Reporting Period 12/22/18 to 01/26/19 for the first 10 visits   See note below for Objective Data and Assessment of Progress/Goals.      Physical Therapy Treatment  Patient Details  Name: Connor [name] MRN: [id number] Date of Birth: 04-26-1934 Referring Provider (PT): [place]   Encounter Date: 01/26/2019  PT End of Session - 01/26/19 6433    Visit Number  10    Date for PT Re-Evaluation  02/01/19    PT Start Time  1050    PT Stop Time  1145    PT Time Calculation (min)  55 min    Activity Tolerance  Patient tolerated treatment well    Behavior During Therapy  [facility] for tasks assessed/performed       Past Medical History:  Diagnosis Date  . Benign localized prostatic hyperplasia with lower urinary tract symptoms (LUTS)    urologist-- dr Diona Fanti  . Chronic anticoagulation    Xarelto for afib  . Chronic constipation   . Chronic cystitis   . Gross hematuria   . History of DVT of lower extremity 06/08/2010   right femoral dvt dx post op total hip 05-08-2010  . History of gastric ulcer    remote  . History of kidney stones    one stone. remote hx  . Hyperlipidemia   . Hypertension    Dx by Dr. Lance Muss around age  56   . NICM (nonischemic cardiomyopathy) ([place])    per echo 06-11-2017,  ef 45-50% with diffuse hypokinesis,  G1DD  . OSA on CPAP 2017   compliant with CPAP.  managed by Dr Elsworth Soho.  . Osteoarthritis   . Other urethral stricture, male, meatal    chronic;  pt self dilates  . Paroxysmal atrial fibrillation [facility]) cardiologist-- dr Rayann Heman   first dx 2009/   a. documented by Dr Barrett Shell on office EKG 9/12. b. maintained on Tikosyn and Xarelto. c. s/p DCCV 's.  d.  x3  EP w/ ablation atrial fib last one 12-05-2016  . Prediabetes   . Premature ventricular contraction   . PVC's (premature  ventricular contractions)   . RA (rheumatoid arthritis) [facility])    rheumatologist--- Dr Page Spiro,  treated with Humira  . RBBB (right bundle branch block)    Hx of a fib, ablation Aug 2018  . Rheumatoid arthritis ([place])   . Vitamin D deficiency   . Wears hearing aid in both ears     Past Surgical History:  Procedure Laterality Date  . ABLATION OF DYSRHYTHMIC FOCUS  08/29/2015  . ATRIAL FIBRILLATION ABLATION  12/05/2016  . ATRIAL FIBRILLATION ABLATION N/A 12/05/2016   Procedure: Atrial Fibrillation Ablation;  Surgeon: [name], MD;  Location: [place] CV LAB;  Service: Cardiovascular;  Laterality: N/A;  . ATRIAL FLUTTER ABLATION  09/2010   by Greggory Brandy  . BUNIONECTOMY Right 1990s  . CARDIOVERSION N/A 10/28/2012   Procedure: CARDIOVERSION;  Surgeon: Burnell Blanks, MD;  Location: [place];  Service: Cardiovascular;  Laterality: N/A;  . CATARACT EXTRACTION W/ INTRAOCULAR LENS  IMPLANT, BILATERAL  2019  . CYSTOSCOPY WITH BIOPSY N/A 09/03/2018   Procedure: CYSTOSCOPY WITH FULGURATION IRJJOACZY;  Surgeon: Franchot Gallo, MD;  [facility];  Service: Urology;  Laterality: N/A;  30 MINS  . ELECTROPHYSIOLOGIC STUDY N/A 08/29/2015   Procedure: Atrial Fibrillation Ablation;  Surgeon: Jeneen Rinks  Allred, MD;  Location: [place] CV LAB;  Service: Cardiovascular;  Laterality: N/A;  . KNEE SURGERY Bilateral x3   1960s & 1970s   open  . LEFT HEART CATHETERIZATION WITH CORONARY ANGIOGRAM N/A 06/07/2011   Procedure: LEFT HEART CATHETERIZATION WITH CORONARY ANGIOGRAM;  Surgeon: Peter M Martinique, MD;  Location: [facility] CATH LAB;  Service: Cardiovascular;  Laterality: N/A;    Normal coronary arteries,  low normal LVF  . TONSILLECTOMY AND ADENOIDECTOMY  age 25  . TOTAL HIP ARTHROPLASTY Right 06/04/10    dr Noemi Chapel  _0   . TOTAL KNEE ARTHROPLASTY Bilateral right 1995;  left 1997  . TRANSURETHRAL RESECTION OF PROSTATE  06-29-2002   dr humphries  _1   . TRANSURETHRAL RESECTION OF PROSTATE N/A 07/06/2018    Procedure: TRANSURETHRAL RESECTION OF THE PROSTATE (TURP);  Surgeon: Franchot Gallo, MD;  Location: [facility];  Service: Urology;  Laterality: N/A;  45 MINS    There were no vitals filed for this visit.  Subjective Assessment - 01/26/19 1136    Subjective  Patient reeports still doing well, just get stiff and tight which he feels causes some of his balance issues    Currently in Pain?  Yes    Pain Score  3     Pain Location  Neck    Pain Orientation  Right    Pain Descriptors / Indicators  Tightness;Spasm    Aggravating Factors   unsure                       [facility] Adult PT Treatment/Exercise - 01/26/19 0001      Manual Therapy   Manual Therapy  Soft tissue mobilization    Manual therapy comments  some passive ROM to end range of cervical rotation and side bending and some contract relax techniques    Soft tissue mobilization  worked more on a deep knot in the right lateral and anterior area that is veyr tender               PT Short Term Goals - 12/22/18 1525      PT SHORT TERM GOAL #1   Title  independent with initial HEP    Status  Achieved        PT Long Term Goals - 01/26/19 1140      PT LONG TERM GOAL #1   Title  understand posture and body mechanics    Status  Achieved      PT LONG TERM GOAL #2   Title  increase cervical ROM 25%    Status  Achieved      PT LONG TERM GOAL #3   Title  report 25% easier to back up the car    Status  Achieved      PT LONG TERM GOAL #4   Title  decrease pain 50%    Status  Partially Met            Plan - 01/26/19 1138    Clinical Impression Statement  Patient still gets tight weekly, I questioned him about posture and if he is watching TV in a position where the head is turned, He is unsure of why the tightness comes.  It is better and he is demonstrating better ROM overall    PT Next Visit Plan  will continue to see him and try to see if we can get him doing all at home     Consulted and Agree with Plan of  Care  Patient       Patient will benefit from skilled therapeutic intervention in order to improve the following deficits and impairments:  Decreased range of motion, Increased muscle spasms, Impaired flexibility, Postural dysfunction, Improper body mechanics, Pain, Decreased strength, Decreased balance, Difficulty walking  Visit Diagnosis: Cervicalgia  Cramp and spasm  Dizziness and giddiness  Unsteadiness on feet     Problem List Patient Active Problem List   Diagnosis Date Noted  . Malignant neoplasm of prostate ([place]) 09/11/2018  . Enlarged prostate with urinary obstruction 07/06/2018  . Gastrocnemius strain, right, initial encounter 12/10/2017  . Baker's cyst of knee, right 12/10/2017  . Obesity 03/24/2017  . Paroxysmal atrial fibrillation ([place]) 12/05/2016  . Near syncope 04/20/2016  . Leg hematoma, left, initial encounter 04/20/2016  . Bleeding   . Ecchymosis   . Left hamstring muscle strain   . Muscle tear   . RBBB 08/30/2015  . A-fib ([place]) 08/29/2015  . Chronic systolic dysfunction of left ventricle 07/11/2014  . OSA (obstructive sleep apnea) 04/07/2013  . Multinodular goiter 01/20/2013  . Cervical spondylosis without myelopathy 01/18/2013  . Atrial fibrillation with RVR ([place]) 10/27/2012  . PAF (paroxysmal atrial fibrillation) ([place]) 05/28/2012  . Left ventricular dysfunction 12/17/2011  . Low back pain 09/11/2011  . Fatigue 05/22/2011  . PVC (premature ventricular contraction) 01/18/2011  . Persistent atrial fibrillation 01/12/2011  . Hyperlipidemia 08/02/2010  . Hypertension 08/02/2010  . Osteoarthritis 08/02/2010  . BPH (benign prostatic hyperplasia) 08/02/2010  . GERD (gastroesophageal reflux disease) 08/02/2010  . h/o Atrial flutter [facility]) s/p ablation 2012     Sumner Boast., PT 01/26/2019, 11:41 AM  [address] [place], [place],  [address] Phone: [phone]   Fax:  [phone]  Name: Connor [name] MRN: [id number] Date of Birth: 01-01-1934

## 2019-01-27 NOTE — Progress Notes (Signed)
Left patient voicemail today to inform or remind about available free support services at [facility].  Patient encouraged to reach out to me by phone for more information about support groups or to schedule a one-on-one counseling appointment.  Verlan [facility] [place] Counseling Intern Voicemail: [phone]

## 2019-01-28 ENCOUNTER — Ambulatory Visit: Payer: Medicare Other | Admitting: Physical Therapy

## 2019-01-28 ENCOUNTER — Encounter: Payer: Self-pay | Admitting: Physical Therapy

## 2019-01-28 ENCOUNTER — Other Ambulatory Visit: Payer: Self-pay

## 2019-01-28 DIAGNOSIS — R2681 Unsteadiness on feet: Secondary | ICD-10-CM

## 2019-01-28 DIAGNOSIS — M542 Cervicalgia: Secondary | ICD-10-CM

## 2019-01-28 DIAGNOSIS — R252 Cramp and spasm: Secondary | ICD-10-CM

## 2019-01-28 DIAGNOSIS — R42 Dizziness and giddiness: Secondary | ICD-10-CM

## 2019-01-28 NOTE — Therapy (Signed)
[address] [place], [place], [address] Phone: [phone]   Fax:  [phone]  Physical Therapy Treatment  Patient Details  Name: Connor [name] MRN: [id number] Date of Birth: 09/22/1933 Referring Provider (PT): [place]   Encounter Date: 01/28/2019  PT End of Session - 01/28/19 1124    Visit Number  11    Date for PT Re-Evaluation  02/01/19    PT Start Time  6962    PT Stop Time  1145    PT Time Calculation (min)  53 min    Activity Tolerance  Patient tolerated treatment well       Past Medical History:  Diagnosis Date  . Benign localized prostatic hyperplasia with lower urinary tract symptoms (LUTS)    urologist-- dr Diona Fanti  . Chronic anticoagulation    Xarelto for afib  . Chronic constipation   . Chronic cystitis   . Gross hematuria   . History of DVT of lower extremity 06/08/2010   right femoral dvt dx post op total hip 05-08-2010  . History of gastric ulcer    remote  . History of kidney stones    one stone. remote hx  . Hyperlipidemia   . Hypertension    Dx by Dr. Lance Muss around age  85   . NICM (nonischemic cardiomyopathy) ([place])    per echo 06-11-2017,  ef 45-50% with diffuse hypokinesis,  G1DD  . OSA on CPAP 2017   compliant with CPAP.  managed by Dr Elsworth Soho.  . Osteoarthritis   . Other urethral stricture, male, meatal    chronic;  pt self dilates  . Paroxysmal atrial fibrillation [facility]) cardiologist-- dr Rayann Heman   first dx 2009/   a. documented by Dr Barrett Shell on office EKG 9/12. b. maintained on Tikosyn and Xarelto. c. s/p DCCV 's.  d.  x3  EP w/ ablation atrial fib last one 12-05-2016  . Prediabetes   . Premature ventricular contraction   . PVC's (premature ventricular contractions)   . RA (rheumatoid arthritis) [facility])    rheumatologist--- Dr Page Spiro,  treated with Humira  . RBBB (right bundle branch block)    Hx of a fib, ablation Aug 2018  . Rheumatoid arthritis ([place])    . Vitamin D deficiency   . Wears hearing aid in both ears     Past Surgical History:  Procedure Laterality Date  . ABLATION OF DYSRHYTHMIC FOCUS  08/29/2015  . ATRIAL FIBRILLATION ABLATION  12/05/2016  . ATRIAL FIBRILLATION ABLATION N/A 12/05/2016   Procedure: Atrial Fibrillation Ablation;  Surgeon: [name], MD;  Location: [place] CV LAB;  Service: Cardiovascular;  Laterality: N/A;  . ATRIAL FLUTTER ABLATION  09/2010   by Greggory Brandy  . BUNIONECTOMY Right 1990s  . CARDIOVERSION N/A 10/28/2012   Procedure: CARDIOVERSION;  Surgeon: Burnell Blanks, MD;  Location: [place];  Service: Cardiovascular;  Laterality: N/A;  . CATARACT EXTRACTION W/ INTRAOCULAR LENS  IMPLANT, BILATERAL  2019  . CYSTOSCOPY WITH BIOPSY N/A 09/03/2018   Procedure: CYSTOSCOPY WITH FULGURATION [id number];  Surgeon: Franchot Gallo, MD;  [facility];  Service: Urology;  Laterality: N/A;  30 MINS  . ELECTROPHYSIOLOGIC STUDY N/A 08/29/2015   Procedure: Atrial Fibrillation Ablation;  Surgeon: [name], MD;  Location: [place] CV LAB;  Service: Cardiovascular;  Laterality: N/A;  . KNEE SURGERY Bilateral x3   1960s & 1970s   open  . LEFT HEART CATHETERIZATION WITH CORONARY ANGIOGRAM N/A 06/07/2011  Procedure: LEFT HEART CATHETERIZATION WITH CORONARY ANGIOGRAM;  Surgeon: Peter M Martinique, MD;  Location: [facility] CATH LAB;  Service: Cardiovascular;  Laterality: N/A;    Normal coronary arteries,  low normal LVF  . TONSILLECTOMY AND ADENOIDECTOMY  age 85  . TOTAL HIP ARTHROPLASTY Right 06/04/10    dr Noemi Chapel  _0   . TOTAL KNEE ARTHROPLASTY Bilateral right 1995;  left 1997  . TRANSURETHRAL RESECTION OF PROSTATE  06-29-2002   dr humphries  _1   . TRANSURETHRAL RESECTION OF PROSTATE N/A 07/06/2018   Procedure: TRANSURETHRAL RESECTION OF THE PROSTATE (TURP);  Surgeon: Franchot Gallo, MD;  Location: [facility];  Service: Urology;  Laterality: N/A;  45 MINS    There were no vitals filed for this  visit.  Subjective Assessment - 01/28/19 1122    Subjective  Patient reports that he feels like the current treatment is doing well and he reports having less balance issues and less unsteadiness    Currently in Pain?  Yes    Pain Score  2     Pain Location  Neck    Pain Orientation  Right    Pain Descriptors / Indicators  Tightness;Spasm                       [facility] Adult PT Treatment/Exercise - 01/28/19 0001      Moist Heat Therapy   Number Minutes Moist Heat  15 Minutes    Moist Heat Location  Shoulder;Cervical      Electrical Stimulation   Electrical Stimulation Location  right cervical area    Electrical Stimulation Action  IFC    Electrical Stimulation Parameters  supine    Electrical Stimulation Goals  Pain      Traction   Type of Traction  Cervical    Min (lbs)  19    Hold Time  static    Time  15      Manual Therapy   Manual Therapy  Soft tissue mobilization    Manual therapy comments  some passive ROM to end range of cervical rotation and side bending and some contract relax techniques    Soft tissue mobilization  worked more on a deep knot in the right lateral and anterior area that is veyr tender               PT Short Term Goals - 12/22/18 1525      PT SHORT TERM GOAL #1   Title  independent with initial HEP    Status  Achieved        PT Long Term Goals - 01/26/19 1140      PT LONG TERM GOAL #1   Title  understand posture and body mechanics    Status  Achieved      PT LONG TERM GOAL #2   Title  increase cervical ROM 25%    Status  Achieved      PT LONG TERM GOAL #3   Title  report 25% easier to back up the car    Status  Achieved      PT LONG TERM GOAL #4   Title  decrease pain 50%    Status  Partially Met            Plan - 01/28/19 1124    Clinical Impression Statement  Patient witht he tightness in the right lateral and anterior neck area, there is tenderness here.  His ROM is better and he reports easier to  back up the car.  Gave tband to do HEP    PT Next Visit Plan  again see if he will do HEP    Consulted and Agree with Plan of Care  Patient       Patient will benefit from skilled therapeutic intervention in order to improve the following deficits and impairments:  Decreased range of motion, Increased muscle spasms, Impaired flexibility, Postural dysfunction, Improper body mechanics, Pain, Decreased strength, Decreased balance, Difficulty walking  Visit Diagnosis: Cervicalgia  Cramp and spasm  Dizziness and giddiness  Unsteadiness on feet     Problem List Patient Active Problem List   Diagnosis Date Noted  . Malignant neoplasm of prostate ([place]) 09/11/2018  . Enlarged prostate with urinary obstruction 07/06/2018  . Gastrocnemius strain, right, initial encounter 12/10/2017  . Baker's cyst of knee, right 12/10/2017  . Obesity 03/24/2017  . Paroxysmal atrial fibrillation ([place]) 12/05/2016  . Near syncope 04/20/2016  . Leg hematoma, left, initial encounter 04/20/2016  . Bleeding   . Ecchymosis   . Left hamstring muscle strain   . Muscle tear   . RBBB 08/30/2015  . A-fib ([place]) 08/29/2015  . Chronic systolic dysfunction of left ventricle 07/11/2014  . OSA (obstructive sleep apnea) 04/07/2013  . Multinodular goiter 01/20/2013  . Cervical spondylosis without myelopathy 01/18/2013  . Atrial fibrillation with RVR ([place]) 10/27/2012  . PAF (paroxysmal atrial fibrillation) ([place]) 05/28/2012  . Left ventricular dysfunction 12/17/2011  . Low back pain 09/11/2011  . Fatigue 05/22/2011  . PVC (premature ventricular contraction) 01/18/2011  . Persistent atrial fibrillation 01/12/2011  . Hyperlipidemia 08/02/2010  . Hypertension 08/02/2010  . Osteoarthritis 08/02/2010  . BPH (benign prostatic hyperplasia) 08/02/2010  . GERD (gastroesophageal reflux disease) 08/02/2010  . h/o Atrial flutter [facility]) s/p ablation 2012     Sumner Boast., PT 01/28/2019, 11:28 AM  [address] [place], [place], [address] Phone: [phone]   Fax:  [phone]  Name: IMARI SIVERTSEN MRN: [id number] Date of Birth: February 10, 1934

## 2019-02-03 ENCOUNTER — Other Ambulatory Visit: Payer: Self-pay

## 2019-02-03 ENCOUNTER — Encounter: Payer: Self-pay | Admitting: Physical Therapy

## 2019-02-03 ENCOUNTER — Ambulatory Visit: Payer: Medicare Other | Admitting: Physical Therapy

## 2019-02-03 DIAGNOSIS — M542 Cervicalgia: Secondary | ICD-10-CM

## 2019-02-03 DIAGNOSIS — R2681 Unsteadiness on feet: Secondary | ICD-10-CM

## 2019-02-03 DIAGNOSIS — R252 Cramp and spasm: Secondary | ICD-10-CM

## 2019-02-03 NOTE — Therapy (Signed)
[address] [place], [place], [address] Phone: [phone]   Fax:  [phone]  Physical Therapy Treatment  Patient Details  Name: Connor [name] MRN: [id number] Date of Birth: [birth year] Referring Provider (PT): [place]   Encounter Date: 02/03/2019  PT End of Session - 02/03/19 1330    Visit Number  12    PT Start Time  [id number]    PT Stop Time  1350    PT Time Calculation (min)  45 min    Activity Tolerance  Patient tolerated treatment well    Behavior During Therapy  [facility] for tasks assessed/performed       Past Medical History:  Diagnosis Date  . Benign localized prostatic hyperplasia with lower urinary tract symptoms (LUTS)    urologist-- dr Diona Fanti  . Chronic anticoagulation    Xarelto for afib  . Chronic constipation   . Chronic cystitis   . Gross hematuria   . History of DVT of lower extremity 06/08/2010   right femoral dvt dx post op total hip 05-08-2010  . History of gastric ulcer    remote  . History of kidney stones    one stone. remote hx  . Hyperlipidemia   . Hypertension    Dx by Dr. Lance Muss around age  64   . NICM (nonischemic cardiomyopathy) ([place])    per echo 06-11-2017,  ef 45-50% with diffuse hypokinesis,  G1DD  . OSA on CPAP 2017   compliant with CPAP.  managed by Dr Elsworth Soho.  . Osteoarthritis   . Other urethral stricture, male, meatal    chronic;  pt self dilates  . Paroxysmal atrial fibrillation [facility]) cardiologist-- dr Rayann Heman   first dx 2009/   a. documented by Dr Barrett Shell on office EKG 9/12. b. maintained on Tikosyn and Xarelto. c. s/p DCCV 's.  d.  x3  EP w/ ablation atrial fib last one 12-05-2016  . Prediabetes   . Premature ventricular contraction   . PVC's (premature ventricular contractions)   . RA (rheumatoid arthritis) [facility])    rheumatologist--- Dr Page Spiro,  treated with Humira  . RBBB (right bundle branch block)    Hx of a fib, ablation Aug 2018  .  Rheumatoid arthritis ([place])   . Vitamin D deficiency   . Wears hearing aid in both ears     Past Surgical History:  Procedure Laterality Date  . ABLATION OF DYSRHYTHMIC FOCUS  08/29/2015  . ATRIAL FIBRILLATION ABLATION  12/05/2016  . ATRIAL FIBRILLATION ABLATION N/A 12/05/2016   Procedure: Atrial Fibrillation Ablation;  Surgeon: [name], MD;  Location: [place] CV LAB;  Service: Cardiovascular;  Laterality: N/A;  . ATRIAL FLUTTER ABLATION  09/2010   by Greggory Brandy  . BUNIONECTOMY Right 1990s  . CARDIOVERSION N/A 10/28/2012   Procedure: CARDIOVERSION;  Surgeon: Burnell Blanks, MD;  Location: [place];  Service: Cardiovascular;  Laterality: N/A;  . CATARACT EXTRACTION W/ INTRAOCULAR LENS  IMPLANT, BILATERAL  2019  . CYSTOSCOPY WITH BIOPSY N/A 09/03/2018   Procedure: CYSTOSCOPY WITH FULGURATION [id number];  Surgeon: Franchot Gallo, MD;  [facility];  Service: Urology;  Laterality: N/A;  30 MINS  . ELECTROPHYSIOLOGIC STUDY N/A 08/29/2015   Procedure: Atrial Fibrillation Ablation;  Surgeon: [name], MD;  Location: [place] CV LAB;  Service: Cardiovascular;  Laterality: N/A;  . KNEE SURGERY Bilateral x3   1960s & 1970s   open  . LEFT HEART CATHETERIZATION WITH CORONARY ANGIOGRAM N/A  06/07/2011   Procedure: LEFT HEART CATHETERIZATION WITH CORONARY ANGIOGRAM;  Surgeon: Peter M Martinique, MD;  Location: [facility] CATH LAB;  Service: Cardiovascular;  Laterality: N/A;    Normal coronary arteries,  low normal LVF  . TONSILLECTOMY AND ADENOIDECTOMY  age 21  . TOTAL HIP ARTHROPLASTY Right 06/04/10    dr Noemi Chapel  @[facility]   . TOTAL KNEE ARTHROPLASTY Bilateral right 1995;  left 1997  . TRANSURETHRAL RESECTION OF PROSTATE  06-29-2002   dr humphries  @[facility]   . TRANSURETHRAL RESECTION OF PROSTATE N/A 07/06/2018   Procedure: TRANSURETHRAL RESECTION OF THE PROSTATE (TURP);  Surgeon: Franchot Gallo, MD;  Location: [facility];  Service: Urology;  Laterality: N/A;  45 MINS    There were no vitals  filed for this visit.  Subjective Assessment - 02/03/19 1329    Subjective  Patietn reports that he is feeling better, still tight and sore at times in the right neck area    Currently in Pain?  Yes    Pain Score  2     Pain Location  Neck    Pain Orientation  Right    Effect of Pain on Daily Activities  reading                       [place] Adult PT Treatment/Exercise - 02/03/19 0001      Moist Heat Therapy   Number Minutes Moist Heat  15 Minutes    Moist Heat Location  Shoulder;Cervical      Electrical Stimulation   Electrical Stimulation Location  right cervical area    Electrical Stimulation Action  IFC    Electrical Stimulation Parameters  supine    Electrical Stimulation Goals  Pain      Traction   Type of Traction  Cervical    Min (lbs)  20    Hold Time  static    Time  15      Manual Therapy   Manual Therapy  Soft tissue mobilization    Manual therapy comments  some passive ROM to end range of cervical rotation and side bending and some contract relax techniques    Soft tissue mobilization  worked more on a deep knot in the right lateral and anterior area that is veyr tender               PT Short Term Goals - 12/22/18 1525      PT SHORT TERM GOAL #1   Title  independent with initial HEP    Status  Achieved        PT Long Term Goals - 02/03/19 1333      PT LONG TERM GOAL #1   Title  understand posture and body mechanics    Status  Achieved      PT LONG TERM GOAL #2   Title  increase cervical ROM 25%    Status  Achieved      PT LONG TERM GOAL #3   Title  report 25% easier to back up the car    Status  Achieved      PT LONG TERM GOAL #4   Title  decrease pain 50%    Status  Achieved      PT LONG TERM GOAL #5   Title  increase berg balance score to 50/56 to decrease fall risk    Status  Achieved            Plan - 02/03/19 1331  Clinical Impression Statement  Patient reports that he is doing well.  He is going to  the beach next week and feels like he can be let go.  He reports that he can back up the car and is able to sleep better.    PT Next Visit Plan  reports doing great with the HEP, will place on hold and d/c in 3 weeks    Consulted and Agree with Plan of Care  Patient       Patient will benefit from skilled therapeutic intervention in order to improve the following deficits and impairments:  Decreased range of motion, Increased muscle spasms, Impaired flexibility, Postural dysfunction, Improper body mechanics, Pain, Decreased strength, Decreased balance, Difficulty walking  Visit Diagnosis: Cervicalgia  Unsteadiness on feet  Cramp and spasm     Problem List Patient Active Problem List   Diagnosis Date Noted  . Malignant neoplasm of prostate ([place]) 09/11/2018  . Enlarged prostate with urinary obstruction 07/06/2018  . Gastrocnemius strain, right, initial encounter 12/10/2017  . Baker's cyst of knee, right 12/10/2017  . Obesity 03/24/2017  . Paroxysmal atrial fibrillation ([place]) 12/05/2016  . Near syncope 04/20/2016  . Leg hematoma, left, initial encounter 04/20/2016  . Bleeding   . Ecchymosis   . Left hamstring muscle strain   . Muscle tear   . RBBB 08/30/2015  . A-fib ([place]) 08/29/2015  . Chronic systolic dysfunction of left ventricle 07/11/2014  . OSA (obstructive sleep apnea) 04/07/2013  . Multinodular goiter 01/20/2013  . Cervical spondylosis without myelopathy 01/18/2013  . Atrial fibrillation with RVR ([place]) 10/27/2012  . PAF (paroxysmal atrial fibrillation) ([place]) 05/28/2012  . Left ventricular dysfunction 12/17/2011  . Low back pain 09/11/2011  . Fatigue 05/22/2011  . PVC (premature ventricular contraction) 01/18/2011  . Persistent atrial fibrillation 01/12/2011  . Hyperlipidemia 08/02/2010  . Hypertension 08/02/2010  . Osteoarthritis 08/02/2010  . BPH (benign prostatic hyperplasia) 08/02/2010  . GERD (gastroesophageal reflux disease) 08/02/2010  . h/o Atrial  flutter [facility]) s/p ablation 2012     Sumner Boast., PT 02/03/2019, 1:35 PM  [place], [place], [address] Phone: [phone]   Fax:  [phone]  Name: Connor [name] MRN: [id number] Date of Birth: 01-Feb-1934

## 2019-02-11 ENCOUNTER — Ambulatory Visit
Admission: RE | Admit: 2019-02-11 | Discharge: 2019-02-11 | Disposition: A | Discharge status: Home / Self Care | Payer: Medicare Other | Source: Ambulatory Visit | Attending: Urology | Admitting: Urology

## 2019-02-11 ENCOUNTER — Other Ambulatory Visit: Payer: Self-pay

## 2019-02-11 ENCOUNTER — Encounter: Payer: Self-pay | Admitting: Urology

## 2019-02-11 DIAGNOSIS — C61 Malignant neoplasm of prostate: Secondary | ICD-10-CM

## 2019-02-11 NOTE — Progress Notes (Signed)
Radiation Oncology         [id number]) [phone] ________________________________  Name: Connor [name] MRN: [id number]  Date: 02/11/2019  DOB: 1934/05/03  Post Treatment Note  CC: Copland, Gay Filler, MD  Copland, Gay Filler, MD  Diagnosis:   85 y.o. gentleman with Stage T1b adenocarcinoma of the prostate with Gleason score of 4+4, and oligometastatic disease in a right external iliac lymph node with a PSA of 2.6 (5.2 adjusted for finasteride).     Interval Since Last Radiation:  4 weeks  11/17/18-01/12/19: 1. The prostate, seminal vesicles, and pelvic lymph nodes were initially treated to 45 Gy in 25 fractions of 1.8 Gy  2. The prostate and positive lymph node were boosted to 75 Gy with 15 additional fractions of 2.0 Gy   Narrative:  I spoke with the patient to conduct his routine scheduled 1 month follow up visit via [place] virtual visit to spare the patient unnecessary potential exposure in the healthcare setting during the current COVID-19 pandemic.  The patient was notified in advance and gave permission to proceed with this visit format. He tolerated radiation treatment relatively well with minor urinary irritation and modest fatigue.  He reported nocturia up to 7 times nightly, occasional dysuria and urgency. He denied any issues with his bowels. Patient reported moderate fatigue. He denied hematuria or incontinence and felt that he emptied his bladder well on voiding for the most part and maintained a moderate stream.  On review of systems, the patient states that he is doing very well overall.  He has noticed gradual improvement in his LUTS though he continues with increased frequency, urgency, hesitancy, intermittency and nocturia 3-4 times per night.  He also continues with occasional dysuria at the beginning of his stream but this is no longer requiring management with AZO.  He feels like the strength of his stream is gradually improving as well and he is only noticing occasional scant  gross hematuria at the very beginning of his stream.  He denies any recent fever, chills or night sweats.  He denies abdominal pain, nausea, vomiting, diarrhea or constipation.  Overall, he is quite pleased with his progress to date.  ALLERGIES:  is allergic to penicillins.  Meds: Current Outpatient Medications  Medication Sig Dispense Refill  . acetaminophen (TYLENOL) 500 MG tablet Take 1,000-1,500 mg by mouth 2 (two) times daily as needed for moderate pain.     Marland Kitchen atorvastatin (LIPITOR) 10 MG tablet Take 1 tablet (10 mg total) by mouth daily. 90 tablet 3  . Carboxymethylcellulose Sodium (THERATEARS OP) Apply 1 drop to eye daily as needed (dry eyes).    . Cholecalciferol (VITAMIN D3) 5000 UNITS TABS Take 5,000 Units by mouth See admin instructions. Take 5000 units by mouth every 4 days    . Cranberry 1000 MG CAPS Take 3,000 mg by mouth every evening.     . finasteride (PROSCAR) 5 MG tablet Take 5 mg by mouth every morning.   3  . HUMIRA PEN 40 MG/0.4ML PNKT Inject 40 mg as directed every 14 (fourteen) days. Patient states that he is taking  6  . metoprolol succinate (TOPROL-XL) 50 MG 24 hr tablet Take 25-50 mg by mouth See admin instructions. Take 25 mg in the morning and 50 mg in the evening    . metoprolol tartrate (LOPRESSOR) 25 MG tablet TAKE 1 TABLET BY MOUTH EVERY 6 HOURS AS NEEDED FOR FAST HEART RATES (Patient taking differently: Take 25 mg by mouth every 6 (six) hours  as needed (heart rate over 100). ) 30 tablet 6  . Multiple Vitamins-Minerals (PRESERVISION AREDS 2) CAPS Take 1 capsule by mouth 2 (two) times a day.    . Omega-3 Fatty Acids (OMEGA 3 PO) Take 3 capsules by mouth every evening.     Marland Kitchen omeprazole (PRILOSEC) 20 MG capsule Take 20 mg by mouth daily as needed (acid reflux). Patient states that he takes the medication every 4 days    . rivaroxaban (XARELTO) 20 MG TABS tablet Take 1 tablet (20 mg total) by mouth every evening. 90 tablet 1  . tamsulosin (FLOMAX) 0.4 MG CAPS capsule  Take 1 capsule (0.4 mg total) by mouth daily after supper. 30 capsule 5  . vitamin B-12 (CYANOCOBALAMIN) 500 MCG tablet Take 500 mcg by mouth every evening.     . clindamycin (CLEOCIN T) 1 % external solution Apply 1 application topically daily as needed (for bumps in scalp). Apply to scalp  2  . predniSONE (DELTASONE) 5 MG tablet Take 15 mg by mouth daily.     No current facility-administered medications for this encounter.     Physical Findings:  vitals were not taken for this visit.   /10 In general this is a well appearing caucasian male in no acute distress. He's alert and oriented x4 and appropriate throughout the examination. Cardiopulmonary assessment is negative for acute distress and he exhibits normal effort.   Lab Findings: Lab Results  Component Value Date   WBC 5.4 11/23/2018   HGB 9.2 (L) 11/23/2018   HCT 30.1 (L) 11/23/2018   MCV 85.5 11/23/2018   PLT 188.0 11/23/2018     Radiographic Findings: No results found.  Impression/Plan: 1. 85 y.o. gentleman with Stage T1b adenocarcinoma of the prostate with Gleason score of 4+4, and oligometastatic disease in a right external iliac lymph node with a PSA of 2.6 (5.2 adjusted for finasteride).    He continues to tolerate the ADT fairly well. He will continue to follow up with urology for ongoing PSA determinations and has an appointment scheduled with Dr. Diona Fanti on 02/22/19 for repeat 4 month Lupron. He understands what to expect with regards to PSA monitoring going forward. I will look forward to following his response to treatment via correspondence with urology, and would be happy to continue to participate in his care if clinically indicated. I talked to the patient about what to expect in the future, including his risk for erectile dysfunction and rectal bleeding. I encouraged him to call or return to the office if he has any questions regarding his previous radiation or possible radiation side effects. He was comfortable  with this plan and will follow up as needed.    Nicholos Johns, PA-C

## 2019-02-14 ENCOUNTER — Other Ambulatory Visit (HOSPITAL_COMMUNITY): Payer: Self-pay | Admitting: Internal Medicine

## 2019-02-16 MED ORDER — METOPROLOL SUCCINATE ER 50 MG PO TB24: ORAL_TABLET | ORAL | 1 refills | Status: DC

## 2019-04-20 ENCOUNTER — Encounter: Payer: Self-pay | Admitting: Pulmonary Disease

## 2019-04-20 ENCOUNTER — Ambulatory Visit (INDEPENDENT_AMBULATORY_CARE_PROVIDER_SITE_OTHER): Payer: Medicare Other | Admitting: Pulmonary Disease

## 2019-04-20 ENCOUNTER — Other Ambulatory Visit: Payer: Self-pay

## 2019-04-20 DIAGNOSIS — G4733 Obstructive sleep apnea (adult) (pediatric): Secondary | ICD-10-CM | POA: Diagnosis not present

## 2019-04-20 NOTE — Assessment & Plan Note (Signed)
He is committed to using the CPAP and improving his compliance.  He does not seem to be affected adversely by the mask or the pressure or dryness.  It seems to be mainly related to his urinary complaints and his recent prostate issues. We discussed correlation between OSA and atrial fibrillation Supplies will be renewed as needed   compliance with goal of at least 4-6 hrs every night is the expectation. Advised against medications with sedative side effects Cautioned against driving when sleepy - understanding that sleepiness will vary on a day to day basis

## 2019-04-20 NOTE — Progress Notes (Signed)
I connected with  Connor [name] on 04/20/19 by phone and verified that I am speaking with the correct person using two identifiers.   I discussed the limitations of evaluation and management by telemedicine. The patient expressed understanding and agreed to proceed.  85 yo for FU of obstructive sleep apnea PMH-  recurrent atrial fib. Status post ablation 10/2012 and again 06/2015, 12/2016. DVT after right hip placement February 2012 Nasal injury as a child with chronic nasal blockage  He had a sore throat over the weekend hence this visit was converted to a televisit.  No fevers or coughing.  No sick contacts.  He has been masking and social distancing appropriately during the pandemic. In the interim, he was diagnosed with rheumatoid arthritis and is maintained on Humira every 2 weeks and prednisone 5 mg daily.  This has affected his sleep due to pain, but currently his symptoms are better controlled. He was also diagnosed with prostate cancer and underwent prostatectomy and IMRT, last 12/2018.  CT biopsy of pelvic lymph node showed metastatic adenocarcinoma, he is struggling and still recovering from after effects of radiation to the prostate with urgency and hesitancy. This disturbs his sleep and is waking up every couple of hours. This is reflected on his CPAP compliance which used to be more than 4 hours every night but now has dropped some. He starts off every night using his CPAP but seems to take it off within a few hours. Download shows that CPAP 11 cm is still effective with few residual events and minimal leak  Weight is unchanged He has an ozone cleaner machine   Significant tests/ events reviewed  PSG 05/2013 Moderate OSA with AHI 21/h 07/2013 CPAP of 11 centimeters with a large full face mask       Total encounter time x 53m

## 2019-04-22 ENCOUNTER — Other Ambulatory Visit: Payer: Self-pay | Admitting: Internal Medicine

## 2019-04-22 NOTE — Telephone Encounter (Signed)
Xarelto 20mg  refill request received. Pt is 85years old, weight-100.7kg, Crea-0.64 on 11/23/2018, last seen by Dr. Rayann Heman on 3/4/20202, Diagnosis-Afib, CrCl-124.30ml/min; Dose is appropriate based on dosing criteria. Will send in refill to requested pharmacy.

## 2019-05-08 DIAGNOSIS — H353132 Nonexudative age-related macular degeneration, bilateral, intermediate dry stage: Secondary | ICD-10-CM | POA: Diagnosis not present

## 2019-05-19 ENCOUNTER — Ambulatory Visit: Payer: Medicare Other | Attending: Internal Medicine

## 2019-05-19 DIAGNOSIS — Z23 Encounter for immunization: Secondary | ICD-10-CM | POA: Insufficient documentation

## 2019-05-19 NOTE — Progress Notes (Signed)
   Covid-19 Vaccination Clinic  Name:  Connor [name]    MRN: [id number] DOB: [birth year]  05/19/2019  Mr. Slomka was observed post Covid-19 immunization for 15 minutes without incidence. He was provided with Vaccine Information Sheet and instruction to access the V-Safe system.   Mr. Callon was instructed to call 911 with any severe reactions post vaccine: Marland Kitchen Difficulty breathing  . Swelling of your face and throat  . A fast heartbeat  . A bad rash all over your body  . Dizziness and weakness    Immunizations Administered    Name Date Dose VIS Date Route   Pfizer COVID-19 Vaccine 05/19/2019 11:21 AM 0.3 mL 04/16/2019 Intramuscular   Manufacturer: [place]   Lot: [id number]   [place]: [id number]

## 2019-05-19 NOTE — Progress Notes (Signed)
Virtual Visit via Video Note  I connected with patient on 05/20/19 at  1:00 PM EST by audio enabled telemedicine application and verified that I am speaking with the correct person using two identifiers.   THIS ENCOUNTER IS A VIRTUAL VISIT DUE TO COVID-19 - PATIENT WAS NOT SEEN IN THE OFFICE. PATIENT HAS CONSENTED TO VIRTUAL VISIT / TELEMEDICINE VISIT   Location of patient: home  Location of provider: office  I discussed the limitations of evaluation and management by telemedicine and the availability of in person appointments. The patient expressed understanding and agreed to proceed.   Subjective:   Connor [name] is a 86 y.o. male who presents for Medicare Annual/Subsequent preventive examination.  Review of Systems:  Home Safety/Smoke Alarms: Feels safe in home. Smoke alarms in place.  Lives w/ wife and dog (golden doodle "Bella") in 1 story home w/ basement. Walk-in shower with grab bars.  Male:   PSA- No results found for: PSA      Objective:    Vitals: BP 120/78 Comment: pt reported vitals  Pulse 76   Temp 97.6 F (36.4 C)   Ht 6' (1.829 m)   Wt 218 lb 9.6 oz (99.2 kg)   BMI 29.65 kg/m   Body mass index is 29.65 kg/m.  Advanced Directives 05/20/2019 02/11/2019 12/01/2018 09/03/2018 09/01/2018 07/06/2018 12/05/2016  Does Patient Have a Medical Advance Directive? Yes Yes No Yes Yes Yes Yes  Type of Paramedic of [place];Living will Living will - Living will;Healthcare Power of Early;Living will [place];Living will  Does patient want to make changes to medical advance directive? No - Patient declined - - No - Patient declined No - Guardian declined - No - Patient declined  Copy of [place] in Chart? No - copy requested No - copy requested - No - copy requested - - No - copy requested  Would patient like information on creating a medical advance directive? - - No - Patient  declined - - - -  Pre-existing out of facility DNR order (yellow form or pink MOST form) - - - - - - -    Tobacco Social History   Tobacco Use  Smoking Status Former Smoker  . Packs/day: 1.00  . Years: 30.00  . Pack years: 30.00  . Types: Cigarettes  . Quit date: 05/06/1978  . Years since quitting: 41.0  Smokeless Tobacco Never Used     Counseling given: Not Answered   Clinical Intake: Pain : No/denies pain     Past Medical History:  Diagnosis Date  . Benign localized prostatic hyperplasia with lower urinary tract symptoms (LUTS)    urologist-- dr Diona Fanti  . Chronic anticoagulation    Xarelto for afib  . Chronic constipation   . Chronic cystitis   . Gross hematuria   . History of DVT of lower extremity 06/08/2010   right femoral dvt dx post op total hip 05-08-2010  . History of gastric ulcer    remote  . History of kidney stones    one stone. remote hx  . Hyperlipidemia   . Hypertension    Dx by Dr. Lance Muss around age  3   . NICM (nonischemic cardiomyopathy) ([place])    per echo 06-11-2017,  ef 45-50% with diffuse hypokinesis,  G1DD  . OSA on CPAP 2017   compliant with CPAP.  managed by Dr Elsworth Soho.  . Osteoarthritis   . Other urethral stricture,  male, meatal    chronic;  pt self dilates  . Paroxysmal atrial fibrillation [facility]) cardiologist-- dr Rayann Heman   first dx 2009/   a. documented by Dr Barrett Shell on office EKG 9/12. b. maintained on Tikosyn and Xarelto. c. s/p DCCV 's.  d.  x3  EP w/ ablation atrial fib last one 12-05-2016  . Prediabetes   . Premature ventricular contraction   . PVC's (premature ventricular contractions)   . RA (rheumatoid arthritis) [facility])    rheumatologist--- Dr Page Spiro,  treated with Humira  . RBBB (right bundle branch block)    Hx of a fib, ablation Aug 2018  . Rheumatoid arthritis ([place])   . Vitamin D deficiency   . Wears hearing aid in both ears    Past Surgical History:  Procedure Laterality Date  . ABLATION OF DYSRHYTHMIC FOCUS   08/29/2015  . ATRIAL FIBRILLATION ABLATION  12/05/2016  . ATRIAL FIBRILLATION ABLATION N/A 12/05/2016   Procedure: Atrial Fibrillation Ablation;  Surgeon: [name], MD;  Location: [place] CV LAB;  Service: Cardiovascular;  Laterality: N/A;  . ATRIAL FLUTTER ABLATION  09/2010   by Greggory Brandy  . BUNIONECTOMY Right 1990s  . CARDIOVERSION N/A 10/28/2012   Procedure: CARDIOVERSION;  Surgeon: Burnell Blanks, MD;  Location: [place];  Service: Cardiovascular;  Laterality: N/A;  . CATARACT EXTRACTION W/ INTRAOCULAR LENS  IMPLANT, BILATERAL  2019  . CYSTOSCOPY WITH BIOPSY N/A 09/03/2018   Procedure: CYSTOSCOPY WITH FULGURATION [id number];  Surgeon: Franchot Gallo, MD;  Location: [facility];  Service: Urology;  Laterality: N/A;  30 MINS  . ELECTROPHYSIOLOGIC STUDY N/A 08/29/2015   Procedure: Atrial Fibrillation Ablation;  Surgeon: [name], MD;  Location: [place] CV LAB;  Service: Cardiovascular;  Laterality: N/A;  . KNEE SURGERY Bilateral x3   1960s & 1970s   open  . LEFT HEART CATHETERIZATION WITH CORONARY ANGIOGRAM N/A 06/07/2011   Procedure: LEFT HEART CATHETERIZATION WITH CORONARY ANGIOGRAM;  Surgeon: Peter M Martinique, MD;  Location: [facility] C[facility] LAB;  Service: Cardiovascular;  Laterality: N/A;    Normal coronary arteries,  low normal LVF  . TONSILLECTOMY AND ADENOIDECTOMY  age 54  . TOTAL HIP ARTHROPLASTY Right 06/04/10    dr Noemi Chapel  @[facility]   . TOTAL KNEE ARTHROPLASTY Bilateral right 1995;  left 1997  . TRANSURETHRAL RESECTION OF PROSTATE  06-29-2002   dr humphries  @[facility]   . TRANSURETHRAL RESECTION OF PROSTATE N/A 07/06/2018   Procedure: TRANSURETHRAL RESECTION OF THE PROSTATE (TURP);  Surgeon: Franchot Gallo, MD;  Location: [facility];  Service: Urology;  Laterality: N/A;  54 MINS   Family History  Problem Relation Age of Onset  . Heart attack Father   . Lung disease Mother   . Arrhythmia Sister   . Atrial fibrillation Brother   . Diabetes Neg Hx    Social History    Socioeconomic History  . Marital status: Married    Spouse name: Joycelyn Schmid  . Number of children: Not on file  . Years of education: Not on file  . Highest education level: Not on file  Occupational History  . Not on file  Tobacco Use  . Smoking status: Former Smoker    Packs/day: 1.00    Years: 30.00    Pack years: 30.00    Types: Cigarettes    Quit date: 05/06/1978    Years since quitting: 41.0  . Smokeless tobacco: Never Used  Substance and Sexual Activity  . Alcohol use: Yes    Comment: occasionally wine and  beer  . Drug use: Never  . Sexual activity: Not on file  Other Topics Concern  . Not on file  Social History Narrative   he patient lives in [place] with his spouse.  Retired.   Social Determinants of Health   Financial Resource Strain:   . Difficulty of Paying Living Expenses: Not on file  Food Insecurity:   . Worried About Charity fundraiser in the Last Year: Not on file  . Ran Out of Food in the Last Year: Not on file  Transportation Needs:   . Lack of Transportation (Medical): Not on file  . Lack of Transportation (Non-Medical): Not on file  Physical Activity:   . Days of Exercise per Week: Not on file  . Minutes of Exercise per Session: Not on file  Stress:   . Feeling of Stress : Not on file  Social Connections:   . Frequency of Communication with Friends and Family: Not on file  . Frequency of Social Gatherings with Friends and Family: Not on file  . Attends Religious Services: Not on file  . Active Member of Clubs or Organizations: Not on file  . Attends Archivist Meetings: Not on file  . Marital Status: Not on file    Outpatient Encounter Medications as of 05/20/2019  Medication Sig  . acetaminophen (TYLENOL) 500 MG tablet Take 1,000-1,500 mg by mouth 2 (two) times daily as needed for moderate pain.   Marland Kitchen atorvastatin (LIPITOR) 10 MG tablet Take 1 tablet (10 mg total) by mouth daily.  . Carboxymethylcellulose Sodium (THERATEARS OP)  Apply 1 drop to eye daily as needed (dry eyes).  . Cholecalciferol (VITAMIN D3) 5000 UNITS TABS Take 5,000 Units by mouth See admin instructions. Take 5000 units by mouth every 4 days  . clindamycin (CLEOCIN T) 1 % external solution Apply 1 application topically daily as needed (for bumps in scalp). Apply to scalp  . Cranberry 1000 MG CAPS Take 3,000 mg by mouth every evening.   . finasteride (PROSCAR) 5 MG tablet Take 5 mg by mouth every morning.   Marland Kitchen HUMIRA PEN 40 MG/0.4ML PNKT Inject 40 mg as directed every 14 (fourteen) days. Patient states that he is taking  . metoprolol succinate (TOPROL-XL) 50 MG 24 hr tablet Take 1/2 tablet in the morning and take 1 tablet in the evening  . metoprolol tartrate (LOPRESSOR) 25 MG tablet TAKE 1 TABLET BY MOUTH EVERY 6 HOURS AS NEEDED FOR FAST HEART RATES (Patient taking differently: Take 25 mg by mouth every 6 (six) hours as needed (heart rate over 100). )  . Multiple Vitamins-Minerals (PRESERVISION AREDS 2) CAPS Take 1 capsule by mouth 2 (two) times a day.  . Omega-3 Fatty Acids (OMEGA 3 PO) Take 3 capsules by mouth every evening.   Marland Kitchen omeprazole (PRILOSEC) 20 MG capsule Take 20 mg by mouth daily as needed (acid reflux). Patient states that he takes the medication every 4 days  . solifenacin (VESICARE) 10 MG tablet Take 10 mg by mouth daily.  . tamsulosin (FLOMAX) 0.4 MG CAPS capsule Take 1 capsule (0.4 mg total) by mouth daily after supper.  . vitamin B-12 (CYANOCOBALAMIN) 500 MCG tablet Take 500 mcg by mouth every evening.   Alveda Reasons 20 MG TABS tablet TAKE 1 TABLET BY MOUTH EVERY DAY IN THE EVENING  . [DISCONTINUED] predniSONE (DELTASONE) 5 MG tablet Take 15 mg by mouth daily.   No facility-administered encounter medications on file as of 05/20/2019.    Activities of  Daily Living In your present state of health, do you have any difficulty performing the following activities: 05/20/2019 09/03/2018  Hearing? Y Y  Comment wearing bilateral hearing aids.  hearing aides bilat   Vision? N N  Difficulty concentrating or making decisions? N N  Walking or climbing stairs? N N  Dressing or bathing? N N  Doing errands, shopping? N -  Preparing Food and eating ? N -  Using the Toilet? N -  In the past six months, have you accidently leaked urine? N -  Do you have problems with loss of bowel control? N -  Managing your Medications? N -  Managing your Finances? N -  Housekeeping or managing your Housekeeping? N -  Some recent data might be hidden    Patient Care Team: Copland, Gay Filler, MD as PCP - General (Family Medicine) [name], MD as PCP - Cardiology (Cardiology) Elsie Saas, MD (Orthopedic Surgery)   Assessment:   This is a routine wellness examination for Ay[place]. Physical assessment deferred to PCP.  Exercise Activities and Dietary recommendations Current Exercise Habits: The patient does not participate in regular exercise at present   Diet (meal preparation, eat out, water intake, caffeinated beverages, dairy products, fruits and vegetables): 24 hr recall Breakfast: applesauce w/ flaxseed. Lunch: PB crackers and cheese Dinner:  Vegetable soup and fruit and corn bread.  Goals    . Increase physical activity       Fall Risk Fall Risk  05/20/2019 11/20/2017 11/08/2016 09/20/2015 11/24/2014  Falls in the past year? 0 No Yes No No  Number falls in past yr: 0 - 1 - -  Injury with Fall? 0 - - - -  Risk for fall due to : - - - Impaired balance/gait -  Follow up Education provided;Falls prevention discussed - Falls prevention discussed - -   Depression Screen PHQ 2/9 Scores 05/20/2019 11/20/2017 11/08/2016 09/20/2015  PHQ - 2 Score 0 0 0 0    Cognitive Function     6CIT Screen 05/20/2019  What Year? 0 points  What month? 0 points  What time? 0 points  Count back from 20 0 points  Months in reverse 0 points  Repeat phrase 0 points  Total Score 0    Immunization History  Administered Date(s) Administered  . Influenza  Split 02/03/2013  . Influenza, High Dose Seasonal PF 02/03/2017  . Influenza,inj,Quad PF,6+ Mos 02/28/2015  . Influenza-Unspecified 02/24/2014, 01/09/2016, 02/05/2017, 01/17/2018  . PFIZER SARS-COV-2 Vaccination 05/19/2019  . Pneumococcal Conjugate-13 09/16/2013  . Pneumococcal Polysaccharide-23 09/20/2015  . Tdap 11/08/2016  . Zoster 06/16/2006   Screening Tests Health Maintenance  Topic Date Due  . TETANUS/TDAP  11/09/2026  . INFLUENZA VACCINE  Completed  . PNA vac Low Risk Adult  Completed       Plan:   See you next year!  Continue to eat heart healthy diet (full of fruits, vegetables, whole grains, lean protein, water--limit salt, fat, and sugar intake) and increase physical activity as tolerated.  Continue doing brain stimulating activities (puzzles, reading, adult coloring books, staying active) to keep memory sharp.   Bring a copy of your living will and/or healthcare power of attorney to your next office visit.    I have personally reviewed and noted the following in the patient's chart:   . Medical and social history . Use of alcohol, tobacco or illicit drugs  . Current medications and supplements . Functional ability and status . Nutritional status . Physical activity . Advanced  directives . List of other physicians . Hospitalizations, surgeries, and ER visits in previous 12 months . Vitals . Screenings to include cognitive, depression, and falls . Referrals and appointments  In addition, I have reviewed and discussed with patient certain preventive protocols, quality metrics, and best practice recommendations. A written personalized care plan for preventive services as well as general preventive health recommendations were provided to patient.     Shela Nevin, [place]  05/20/2019

## 2019-05-20 ENCOUNTER — Encounter: Payer: Self-pay | Admitting: *Deleted

## 2019-05-20 ENCOUNTER — Other Ambulatory Visit: Payer: Self-pay

## 2019-05-20 ENCOUNTER — Ambulatory Visit (INDEPENDENT_AMBULATORY_CARE_PROVIDER_SITE_OTHER): Payer: Medicare PPO | Admitting: *Deleted

## 2019-05-20 VITALS — BP 120/78 | HR 76 | Temp 97.6°F | Ht 72.0 in | Wt 218.6 lb

## 2019-05-20 DIAGNOSIS — Z0000 Encounter for general adult medical examination without abnormal findings: Secondary | ICD-10-CM

## 2019-05-20 NOTE — Patient Instructions (Signed)
See you next year!  Continue to eat heart healthy diet (full of fruits, vegetables, whole grains, lean protein, water--limit salt, fat, and sugar intake) and increase physical activity as tolerated.  Continue doing brain stimulating activities (puzzles, reading, adult coloring books, staying active) to keep memory sharp.   Bring a copy of your living will and/or healthcare power of attorney to your next office visit.   Connor [name] , Thank you for taking time to come for your Medicare Wellness Visit. I appreciate your ongoing commitment to your health goals. Please review the following plan we discussed and let me know if I can assist you in the future.   These are the goals we discussed: Goals    . Increase physical activity       This is a list of the screening recommended for you and due dates:  Health Maintenance  Topic Date Due  . Tetanus Vaccine  11/09/2026  . Flu Shot  Completed  . Pneumonia vaccines  Completed    Preventive Care 47 Years and Older, Male Preventive care refers to lifestyle choices and visits with your health care provider that can promote health and wellness. This includes:  A yearly physical exam. This is also called an annual well check.  Regular dental and eye exams.  Immunizations.  Screening for certain conditions.  Healthy lifestyle choices, such as diet and exercise. What can I expect for my preventive care visit? Physical exam Your health care provider will check:  Height and weight. These may be used to calculate body mass index (BMI), which is a measurement that tells if you are at a healthy weight.  Heart rate and blood pressure.  Your skin for abnormal spots. Counseling Your health care provider may ask you questions about:  Alcohol, tobacco, and drug use.  Emotional well-being.  Home and relationship well-being.  Sexual activity.  Eating habits.  History of falls.  Memory and ability to understand (cognition).  Work and  work Statistician. What immunizations do I need?  Influenza (flu) vaccine  This is recommended every year. Tetanus, diphtheria, and pertussis (Tdap) vaccine  You may need a Td booster every 10 years. Varicella (chickenpox) vaccine  You may need this vaccine if you have not already been vaccinated. Zoster (shingles) vaccine  You may need this after age 54. Pneumococcal conjugate (PCV13) vaccine  One dose is recommended after age 72. Pneumococcal polysaccharide (PPSV23) vaccine  One dose is recommended after age 22. Measles, mumps, and rubella (MMR) vaccine  You may need at least one dose of MMR if you were born in 1957 or later. You may also need a second dose. Meningococcal conjugate (MenACWY) vaccine  You may need this if you have certain conditions. Hepatitis A vaccine  You may need this if you have certain conditions or if you travel or work in places where you may be exposed to hepatitis A. Hepatitis B vaccine  You may need this if you have certain conditions or if you travel or work in places where you may be exposed to hepatitis B. Haemophilus influenzae type b (Hib) vaccine  You may need this if you have certain conditions. You may receive vaccines as individual doses or as more than one vaccine together in one shot (combination vaccines). Talk with your health care provider about the risks and benefits of combination vaccines. What tests do I need? Blood tests  Lipid and cholesterol levels. These may be checked every 5 years, or more frequently depending on your  overall health.  Hepatitis C test.  Hepatitis B test. Screening  Lung cancer screening. You may have this screening every year starting at age 45 if you have a 30-pack-year history of smoking and currently smoke or have quit within the past 15 years.  Colorectal cancer screening. All adults should have this screening starting at age 40 and continuing until age 31. Your health care provider may recommend  screening at age 56 if you are at increased risk. You will have tests every 1-10 years, depending on your results and the type of screening test.  Prostate cancer screening. Recommendations will vary depending on your family history and other risks.  Diabetes screening. This is done by checking your blood sugar (glucose) after you have not eaten for a while (fasting). You may have this done every 1-3 years.  Abdominal aortic aneurysm (AAA) screening. You may need this if you are a current or former smoker.  Sexually transmitted disease (STD) testing. Follow these instructions at home: Eating and drinking  Eat a diet that includes fresh fruits and vegetables, whole grains, lean protein, and low-fat dairy products. Limit your intake of foods with high amounts of sugar, saturated fats, and salt.  Take vitamin and mineral supplements as recommended by your health care provider.  Do not drink alcohol if your health care provider tells you not to drink.  If you drink alcohol: ? Limit how much you have to 0-2 drinks a day. ? Be aware of how much alcohol is in your drink. In the [place] one drink equals one 12 oz bottle of beer (355 mL), one 5 oz glass of wine (148 mL), or one 1 oz glass of hard liquor (44 mL). Lifestyle  Take daily care of your teeth and gums.  Stay active. Exercise for at least 30 minutes on 5 or more days each week.  Do not use any products that contain nicotine or tobacco, such as cigarettes, e-cigarettes, and chewing tobacco. If you need help quitting, ask your health care provider.  If you are sexually active, practice safe sex. Use a condom or other form of protection to prevent STIs (sexually transmitted infections).  Talk with your health care provider about taking a low-dose aspirin or statin. What's next?  Visit your health care provider once a year for a well check visit.  Ask your health care provider how often you should have your eyes and teeth  checked.  Stay up to date on all vaccines. This information is not intended to replace advice given to you by your health care provider. Make sure you discuss any questions you have with your health care provider. Document Revised: 04/16/2018 Document Reviewed: 04/16/2018 Elsevier Patient Education  2020 [employer].

## 2019-06-07 DIAGNOSIS — H353132 Nonexudative age-related macular degeneration, bilateral, intermediate dry stage: Secondary | ICD-10-CM | POA: Diagnosis not present

## 2019-06-08 ENCOUNTER — Ambulatory Visit: Payer: Medicare PPO | Attending: Internal Medicine

## 2019-06-08 DIAGNOSIS — Z23 Encounter for immunization: Secondary | ICD-10-CM

## 2019-06-08 NOTE — Progress Notes (Signed)
   Covid-19 Vaccination Clinic  Name:  Connor [name]    MRN: [id number] DOB: [birth year]  06/08/2019  [name] was observed post Covid-19 immunization for 30 minutes based on pre-vaccination screening without incidence. He was provided with Vaccine Information Sheet and instruction to access the V-Safe system.   [name] was instructed to call 911 with any severe reactions post vaccine: Marland Kitchen Difficulty breathing  . Swelling of your face and throat  . A fast heartbeat  . A bad rash all over your body  . Dizziness and weakness    Immunizations Administered    Name Date Dose VIS Date Route   Pfizer COVID-19 Vaccine 06/08/2019  9:59 AM 0.3 mL 04/16/2019 Intramuscular   Manufacturer: [place]   Lot: [id number]   [place]: [id number]

## 2019-06-09 DIAGNOSIS — M7741 Metatarsalgia, right foot: Secondary | ICD-10-CM | POA: Diagnosis not present

## 2019-06-09 DIAGNOSIS — L602 Onychogryphosis: Secondary | ICD-10-CM | POA: Diagnosis not present

## 2019-06-09 DIAGNOSIS — L84 Corns and callosities: Secondary | ICD-10-CM | POA: Diagnosis not present

## 2019-06-09 DIAGNOSIS — M7742 Metatarsalgia, left foot: Secondary | ICD-10-CM | POA: Diagnosis not present

## 2019-06-09 DIAGNOSIS — I872 Venous insufficiency (chronic) (peripheral): Secondary | ICD-10-CM | POA: Diagnosis not present

## 2019-06-09 DIAGNOSIS — M2041 Other hammer toe(s) (acquired), right foot: Secondary | ICD-10-CM | POA: Diagnosis not present

## 2019-06-09 DIAGNOSIS — M2042 Other hammer toe(s) (acquired), left foot: Secondary | ICD-10-CM | POA: Diagnosis not present

## 2019-06-25 DIAGNOSIS — C61 Malignant neoplasm of prostate: Secondary | ICD-10-CM | POA: Diagnosis not present

## 2019-06-25 DIAGNOSIS — K5730 Diverticulosis of large intestine without perforation or abscess without bleeding: Secondary | ICD-10-CM | POA: Diagnosis not present

## 2019-06-28 DIAGNOSIS — R310 Gross hematuria: Secondary | ICD-10-CM | POA: Diagnosis not present

## 2019-06-28 DIAGNOSIS — C61 Malignant neoplasm of prostate: Secondary | ICD-10-CM | POA: Diagnosis not present

## 2019-06-28 DIAGNOSIS — Z5111 Encounter for antineoplastic chemotherapy: Secondary | ICD-10-CM | POA: Diagnosis not present

## 2019-06-28 DIAGNOSIS — R350 Frequency of micturition: Secondary | ICD-10-CM | POA: Diagnosis not present

## 2019-07-04 ENCOUNTER — Other Ambulatory Visit (HOSPITAL_COMMUNITY): Payer: Self-pay | Admitting: Internal Medicine

## 2019-07-07 DIAGNOSIS — H353132 Nonexudative age-related macular degeneration, bilateral, intermediate dry stage: Secondary | ICD-10-CM | POA: Diagnosis not present

## 2019-07-12 DIAGNOSIS — H40013 Open angle with borderline findings, low risk, bilateral: Secondary | ICD-10-CM | POA: Diagnosis not present

## 2019-07-20 DIAGNOSIS — Z6831 Body mass index (BMI) 31.0-31.9, adult: Secondary | ICD-10-CM | POA: Diagnosis not present

## 2019-07-20 DIAGNOSIS — M0600 Rheumatoid arthritis without rheumatoid factor, unspecified site: Secondary | ICD-10-CM | POA: Diagnosis not present

## 2019-07-20 DIAGNOSIS — M150 Primary generalized (osteo)arthritis: Secondary | ICD-10-CM | POA: Diagnosis not present

## 2019-07-20 DIAGNOSIS — E669 Obesity, unspecified: Secondary | ICD-10-CM | POA: Diagnosis not present

## 2019-07-20 DIAGNOSIS — M2550 Pain in unspecified joint: Secondary | ICD-10-CM | POA: Diagnosis not present

## 2019-08-01 ENCOUNTER — Encounter: Payer: Self-pay | Admitting: Family Medicine

## 2019-08-02 ENCOUNTER — Other Ambulatory Visit: Payer: Self-pay

## 2019-08-02 ENCOUNTER — Telehealth: Payer: Self-pay | Admitting: Internal Medicine

## 2019-08-02 MED ORDER — LISINOPRIL 10 MG PO TABS: 10.0000 mg | ORAL_TABLET | Freq: Every day | ORAL | 3 refills | Status: DC

## 2019-08-02 NOTE — Telephone Encounter (Signed)
Appt made to see Connor [name] this Thursday for blood pressure management

## 2019-08-02 NOTE — Telephone Encounter (Signed)
Pt c/o BP issue: STAT if pt c/o blurred vision, one-sided weakness or slurred speech  1. What are your last 5 BP readings?  167/90 HR65 this monring 181/99  3/28 10pm 183/109 3/28 9pm 151/91   3/28 9am 149/84   3/27 161/95   3/26   2. Are you having any other symptoms (ex. Dizziness, headache, blurred vision, passed out)? NO  3. What is your BP issue? High BP.

## 2019-08-03 ENCOUNTER — Ambulatory Visit: Payer: Medicare PPO | Admitting: Family Medicine

## 2019-08-05 ENCOUNTER — Ambulatory Visit: Payer: Medicare PPO | Admitting: Student

## 2019-08-05 ENCOUNTER — Encounter: Payer: Self-pay | Admitting: Student

## 2019-08-05 ENCOUNTER — Other Ambulatory Visit: Payer: Self-pay | Admitting: Student

## 2019-08-05 ENCOUNTER — Other Ambulatory Visit: Payer: Self-pay

## 2019-08-05 VITALS — BP 130/80 | HR 77 | Ht 72.0 in | Wt 224.8 lb

## 2019-08-05 DIAGNOSIS — I480 Paroxysmal atrial fibrillation: Secondary | ICD-10-CM | POA: Diagnosis not present

## 2019-08-05 DIAGNOSIS — I4891 Unspecified atrial fibrillation: Secondary | ICD-10-CM

## 2019-08-05 DIAGNOSIS — I428 Other cardiomyopathies: Secondary | ICD-10-CM | POA: Diagnosis not present

## 2019-08-05 DIAGNOSIS — I10 Essential (primary) hypertension: Secondary | ICD-10-CM

## 2019-08-05 DIAGNOSIS — G4733 Obstructive sleep apnea (adult) (pediatric): Secondary | ICD-10-CM | POA: Diagnosis not present

## 2019-08-05 LAB — BASIC METABOLIC PANEL
BUN/Creatinine Ratio: 21 (ref 10–24)
BUN: 15 mg/dL (ref 8–27)
CO2: 25 mmol/L (ref 20–29)
Calcium: 8.9 mg/dL (ref 8.6–10.2)
Chloride: 104 mmol/L (ref 96–106)
Creatinine, Ser: 0.71 mg/dL — ABNORMAL LOW (ref 0.76–1.27)
GFR calc Af Amer: 100 mL/min/{1.73_m2} (ref >59)
GFR calc non Af Amer: 87 mL/min/{1.73_m2} (ref >59)
Glucose: 109 mg/dL — ABNORMAL HIGH (ref 65–99)
Potassium: 4 mmol/L (ref 3.5–5.2)
Sodium: 143 mmol/L (ref 134–144)

## 2019-08-05 NOTE — Progress Notes (Signed)
PCP:  Darreld Mclean, MD Primary Cardiologist: [name], MD Electrophysiologist: Dr. [name] SHEPHARD GELMAN [name] is a 86 y.o. male with past medical history of PAF s/p ablation, NICM, HTN, and OSA who presents today for routine electrophysiology followup and check up for abnormal BP. They are seen for Dr. Rayann Heman.   Since last being seen in our clinic, the patient reports doing very well.  He saw his rheumatologist earlier this month and was noted to have a systolic BP in the Q000111Q. He began monitoring at home and got BPs in the 140-180s. He sent a mychart message to his PCP who prescribed lisinopril 10 mg. BP has been [id number] systolic since starting.  He still occasionally feels palpitations, but less frequent. Personal review of Kardia strips (to be scanned) appear to show short runs of atrial tachycardia.   The patient feels that he is tolerating medications without difficulties and is otherwise without complaint today.   Past Medical History:  Diagnosis Date  . Benign localized prostatic hyperplasia with lower urinary tract symptoms (LUTS)    urologist-- dr Diona Fanti  . Chronic anticoagulation    Xarelto for afib  . Chronic constipation   . Chronic cystitis   . Gross hematuria   . History of DVT of lower extremity 06/08/2010   right femoral dvt dx post op total hip 05-08-2010  . History of gastric ulcer    remote  . History of kidney stones    one stone. remote hx  . Hyperlipidemia   . Hypertension    Dx by Dr. Lance Muss around age  59   . NICM (nonischemic cardiomyopathy) ([place])    per echo 06-11-2017,  ef 45-50% with diffuse hypokinesis,  G1DD  . OSA on CPAP 2017   compliant with CPAP.  managed by Dr Elsworth Soho.  . Osteoarthritis   . Other urethral stricture, male, meatal    chronic;  pt self dilates  . Paroxysmal atrial fibrillation [facility]) cardiologist-- dr Rayann Heman   first dx 2009/   a. documented by Dr Barrett Shell on office EKG 9/12. b. maintained on Tikosyn and Xarelto. c. s/p  DCCV 's.  d.  x3  EP w/ ablation atrial fib last one 12-05-2016  . Prediabetes   . Premature ventricular contraction   . PVC's (premature ventricular contractions)   . RA (rheumatoid arthritis) [facility])    rheumatologist--- Dr Page Spiro,  treated with Humira  . RBBB (right bundle branch block)    Hx of a fib, ablation Aug 2018  . Rheumatoid arthritis ([place])   . Vitamin D deficiency   . Wears hearing aid in both ears    Past Surgical History:  Procedure Laterality Date  . ABLATION OF DYSRHYTHMIC FOCUS  08/29/2015  . ATRIAL FIBRILLATION ABLATION  12/05/2016  . ATRIAL FIBRILLATION ABLATION N/A 12/05/2016   Procedure: Atrial Fibrillation Ablation;  Surgeon: [name], MD;  Location: [place] [facility];  Service: Cardiovascular;  Laterality: N/A;  . ATRIAL FLUTTER ABLATION  09/2010   by Greggory Brandy  . BUNIONECTOMY Right 1990s  . CARDIOVERSION N/A 10/28/2012   Procedure: CARDIOVERSION;  Surgeon: Burnell Blanks, MD;  Location: [place];  Service: Cardiovascular;  Laterality: N/A;  . CATARACT EXTRACTION W/ INTRAOCULAR LENS  IMPLANT, BILATERAL  2019  . CYSTOSCOPY WITH BIOPSY N/A 09/03/2018   Procedure: CYSTOSCOPY WITH FULGURATION [id number];  Surgeon: Franchot Gallo, MD;  Location: [facility];  Service: Urology;  Laterality: N/A;  30 MINS  . ELECTROPHYSIOLOGIC STUDY N/A 08/29/2015  Procedure: Atrial Fibrillation Ablation;  Surgeon: [name], MD;  Location: [place] [facility];  Service: Cardiovascular;  Laterality: N/A;  . KNEE SURGERY Bilateral x3   1960s & 1970s   open  . LEFT HEART CATHETERIZATION WITH CORONARY ANGIOGRAM N/A 06/07/2011   Procedure: LEFT HEART CATHETERIZATION WITH CORONARY ANGIOGRAM;  Surgeon: Peter M Martinique, MD;  Location[facility] CATH LAB;  Service: Cardiovascular;  Laterality: N/A;    Normal coronary arteries,  low normal LVF  . TONSILLECTOMY AND ADENOIDECTOMY  age 18  . TOTAL HIP ARTHROPLASTY Right 06/04/10    dr Noemi Chapel  @[facility]   . TOTAL KNEE ARTHROPLASTY Bilateral right 1995;  left  1997  . TRANSURETHRAL RESECTION OF PROSTATE  06-29-2002   dr humphries  @[facility]   . TRANSURETHRAL RESECTION OF PROSTATE N/A 07/06/2018   Procedure: TRANSURETHRAL RESECTION OF THE PROSTATE (TURP);  Surgeon: Franchot Gallo, MD;  Location: [facility];  Service: Urology;  Laterality: N/A;  45 MINS    Current Outpatient Medications  Medication Sig Dispense Refill  . acetaminophen (TYLENOL) 500 MG tablet Take 1,000-1,500 mg by mouth 2 (two) times daily as needed for moderate pain.     Marland Kitchen atorvastatin (LIPITOR) 10 MG tablet Take 1 tablet (10 mg total) by mouth daily. 90 tablet 3  . Carboxymethylcellulose Sodium (THERATEARS OP) Apply 1 drop to eye daily as needed (dry eyes).    . Cholecalciferol (VITAMIN D3) 5000 UNITS TABS Take 5,000 Units by mouth See admin instructions. Take 5000 units by mouth every 4 days    . clindamycin (CLEOCIN T) 1 % external solution Apply 1 application topically daily as needed (for bumps in scalp). Apply to scalp  2  . Cranberry 1000 MG CAPS Take 3,000 mg by mouth every evening.     . finasteride (PROSCAR) 5 MG tablet Take 5 mg by mouth every morning.   3  . HUMIRA PEN 40 MG/0.4ML PNKT Inject 40 mg as directed every 14 (fourteen) days. Patient states that he is taking  6  . lisinopril (ZESTRIL) 10 MG tablet Take 1 tablet (10 mg total) by mouth daily. 30 tablet 3  . metoprolol succinate (TOPROL-XL) 50 MG 24 hr tablet TAKE 1/2 TABLET IN THE MORNING AND TAKE 1 TABLET IN THE EVENING 60 tablet 0  . metoprolol tartrate (LOPRESSOR) 25 MG tablet TAKE 1 TABLET BY MOUTH EVERY 6 HOURS AS NEEDED FOR FAST HEART RATES (Patient taking differently: Take 25 mg by mouth every 6 (six) hours as needed (heart rate over 100). ) 30 tablet 6  . Multiple Vitamins-Minerals (PRESERVISION AREDS 2) CAPS Take 1 capsule by mouth 2 (two) times a day.    . Omega-3 Fatty Acids (OMEGA 3 PO) Take 3 capsules by mouth every evening.     Marland Kitchen omeprazole (PRILOSEC) 20 MG capsule Take 20 mg by mouth daily  as needed (acid reflux). Patient states that he takes the medication every 4 days    . solifenacin (VESICARE) 10 MG tablet Take 10 mg by mouth daily.    . tamsulosin (FLOMAX) 0.4 MG CAPS capsule Take 1 capsule (0.4 mg total) by mouth daily after supper. 30 capsule 5  . vitamin B-12 (CYANOCOBALAMIN) 500 MCG tablet Take 500 mcg by mouth every evening.     Alveda Reasons 20 MG TABS tablet TAKE 1 TABLET BY MOUTH EVERY DAY IN THE EVENING 90 tablet 1   No current facility-administered medications for this visit.    Allergies  Allergen Reactions  . Penicillins Itching, Swelling and  Other (See Comments)    Did it involve swelling of the face/tongue/throat, SOB, or low BP? No Did it involve sudden or severe rash/hives, skin peeling, or any reaction on the inside of your mouth or nose? No Did you need to seek medical attention at a hospital or doctor's office? No When did it last happen?30 + years If all above answers are "NO", may proceed with cephalosporin use.      Social History   Socioeconomic History  . Marital status: Married    Spouse name: Joycelyn Schmid  . Number of children: Not on file  . Years of education: Not on file  . Highest education level: Not on file  Occupational History  . Not on file  Tobacco Use  . Smoking status: Former Smoker    Packs/day: 1.00    Years: 30.00    Pack years: 30.00    Types: Cigarettes    Quit date: 05/06/1978    Years since quitting: 41.2  . Smokeless tobacco: Never Used  Substance and Sexual Activity  . Alcohol use: Yes    Comment: occasionally wine and beer  . Drug use: Never  . Sexual activity: Not on file  Other Topics Concern  . Not on file  Social History Narrative   he patient lives in [place] with his spouse.  Retired.   Social Determinants of Health   Financial Resource Strain:   . Difficulty of Paying Living Expenses:   Food Insecurity:   . Worried About Charity fundraiser in the Last Year:   . Arboriculturist in the  Last Year:   Transportation Needs:   . Film/video editor (Medical):   Marland Kitchen Lack of Transportation (Non-Medical):   Physical Activity:   . Days of Exercise per Week:   . Minutes of Exercise per Session:   Stress:   . Feeling of Stress :   Social Connections:   . Frequency of Communication with Friends and Family:   . Frequency of Social Gatherings with Friends and Family:   . Attends Religious Services:   . Active Member of Clubs or Organizations:   . Attends Archivist Meetings:   Marland Kitchen Marital Status:   Intimate Partner Violence:   . Fear of Current or Ex-Partner:   . Emotionally Abused:   Marland Kitchen Physically Abused:   . Sexually Abused:      Review of Systems: General: No chills, fever, night sweats or weight changes  Cardiovascular:  No chest pain, dyspnea on exertion, edema, orthopnea, palpitations, paroxysmal nocturnal dyspnea Dermatological: No rash, lesions or masses Respiratory: No cough, dyspnea Urologic: No hematuria, dysuria Abdominal: No nausea, vomiting, diarrhea, bright red blood per rectum, melena, or hematemesis Neurologic: No visual changes, weakness, changes in mental status All other systems reviewed and are otherwise negative except as noted above.  Physical Exam: Vitals:   08/05/19 0933  BP: 130/80  Pulse: 77  SpO2: 93%  Weight: 224 lb 12.8 oz (102 kg)  Height: 6' (1.829 m)    GEN- The patient is well appearing, alert and oriented x 3 today.   HEENT: normocephalic, atraumatic; sclera clear, conjunctiva pink; hearing intact; oropharynx clear; neck supple, no JVP Lymph- no cervical lymphadenopathy Lungs- Clear to ausculation bilaterally, normal work of breathing.  No wheezes, rales, rhonchi Heart- Regular rate and rhythm, no murmurs, rubs or gallops, PMI not laterally displaced GI- soft, non-tender, non-distended, bowel sounds present, no hepatosplenomegaly Extremities- no clubbing, cyanosis, or edema; DP/PT/radial pulses 2+ bilaterally MS-  no  significant deformity or atrophy Skin- warm and dry, no rash or lesion Psych- euthymic mood, full affect Neuro- strength and sensation are intact  EKG is ordered. Personal review of EKG from today shows NSR at 77 bpm, RBBB QRS 144 ms, PR interval 164 ms.  Assessment and Plan:  1. HTN BP as high as 99991111 systolic at home. In general, systolic ranges from Q000111Q.  Recently started on lisinopril and this has levelled out. Can continue to adjust as needed per PCP. BMET today.    2. PAF Doing well post ablation off tikosyn Previous Kardia episodes have showed paroxysmal episodes of atrial tachycardia.  Continue xarelto for CHA2DS2VASC of at least 5    3. NICM Echo 06/2017 showed LVEF 45-50%.  Continue current medications. BP adjustment as above.   4. OSA Encouraged nightly use  His HTN has improved since starting lisinopril and his PAF/AT is well controlled. RTC 6 months. Sooner with symptoms.   Shirley Friar, PA-C  08/05/19 9:58 AM

## 2019-08-05 NOTE — Patient Instructions (Signed)
Medication Instructions:  none *If you need a refill on your cardiac medications before your next appointment, please call your pharmacy*   Lab Work:  TODAY BMET If you have labs (blood work) drawn today and your tests are completely normal, you will receive your results only by: Marland Kitchen MyChart Message (if you have MyChart) OR . A paper copy in the mail If you have any lab test that is abnormal or we need to change your treatment, we will call you to review the results.   Testing/Procedures: none   Follow-Up: At [facility], you and your health needs are our priority.  As part of our continuing mission to provide you with exceptional heart care, we have created designated Provider Care Teams.  These Care Teams include your primary Cardiologist (physician) and Advanced Practice Providers (APPs -  Physician Assistants and Nurse Practitioners) who all work together to provide you with the care you need, when you need it.   Your next appointment:   6 month(s)  The format for your next appointment:   Either In Person or Virtual  Provider:   Dr Rayann Heman   Other Instructions

## 2019-08-06 DIAGNOSIS — H353132 Nonexudative age-related macular degeneration, bilateral, intermediate dry stage: Secondary | ICD-10-CM | POA: Diagnosis not present

## 2019-08-13 ENCOUNTER — Other Ambulatory Visit: Payer: Self-pay | Admitting: Internal Medicine

## 2019-08-13 ENCOUNTER — Other Ambulatory Visit: Payer: Self-pay

## 2019-08-14 NOTE — Progress Notes (Addendum)
[facility] [address] [place], [place] [address] [phone]  Date:  08/16/2019   Name:  Connor [name]   DOB:  1933/11/09   MRN:  [id number]  PCP:  Darreld Mclean, MD    Chief Complaint: Hypertension (follow up medication)   History of Present Illness:  Connor [name] is a 86 y.o. very pleasant male patient who presents with the following:  Gentleman with history of atrial fibrillation, left ventricle dysfunction, prostate cancer, seronegative rheumatoid arthritis and osteoarthritis Last seen by myself in July I recently started him on lisinopril 10 mg due to elevated blood pressure readings He was seen by his cardiology provider at the beginning of the month as follows 1. HTN BP as high as [id number] systolic at home. In general, systolic ranges from [id number].  Recently started on lisinopril and this has levelled out. Can continue to adjust as needed per PCP. BMET today.  2. PAF Doing well post ablation off tikosyn Previous Kardia episodes have showed paroxysmal episodes of atrial tachycardia.  Continue xarelto for CHA2DS2VASC of at least 5   3. NICM Echo 06/2017 showed LVEF 45-50%.  Continue current medications. BP adjustment as above.  4. OSA Encouraged nightly use His HTN has improved since starting lisinopril and his PAF/AT is well controlled. RTC 6 months. Sooner with symptoms.   He underwent TURP procedure in March 2020 for prostate cancer, has completed radiation His lead urologist is Dr. Diona Fanti -his most recent follow-up visit was in February, it appears that he is currently under observation  He does a CT every 4 months  He is still receiving Lupron injections His cardiologist is Dr. Rayann Heman Dr. Lenna Gilford is his rheumatologist He sees pulmonology, Dr. Elsworth Soho annually for CPAP  He is using vesicare for his urinary sx and it has generally been helping.  However just yesterday he noted urgency and  thought he might have a UTI-he is not having dysuria or fever No visible hematuria since December of last year   He has been concerned about his blood sugars- taking some sort of OTC supplement to help control his blood sugar.  Advised that we will check his A1c today.  I do not think the supplement he is taking should be harmful, but also not necessarily helpful He has LE edema- this is long standing.  He wears compression hose when he can remember He also asked me about some sort of leg exercise machine he is seeing advertised.  This appears to be a device which moves the legs.  I again advised him that I do not think this will be harmful, but I am not sure how much it will help  Colon cancer screening- this was going to be done 1-2 years ago but was rescheduled for some reason.  Will order cologuard  Patient Active Problem List   Diagnosis Date Noted  . Malignant neoplasm of prostate ([place]) 09/11/2018  . Enlarged prostate with urinary obstruction 07/06/2018  . Gastrocnemius strain, right, initial encounter 12/10/2017  . Baker's cyst of knee, right 12/10/2017  . Obesity 03/24/2017  . Near syncope 04/20/2016  . Leg hematoma, left, initial encounter 04/20/2016  . Bleeding   . Ecchymosis   . Left hamstring muscle strain   . Muscle tear   . RBBB 08/30/2015  . Chronic systolic dysfunction of left ventricle 07/11/2014  . OSA (obstructive sleep apnea) 04/07/2013  . Multinodular goiter 01/20/2013  .  Cervical spondylosis without myelopathy 01/18/2013  . Atrial fibrillation with RVR ([place]) 10/27/2012  . PAF (paroxysmal atrial fibrillation) ([place]) 05/28/2012  . Left ventricular dysfunction 12/17/2011  . Low back pain 09/11/2011  . Fatigue 05/22/2011  . PVC (premature ventricular contraction) 01/18/2011  . Persistent atrial fibrillation ([place]) 01/12/2011  . Hyperlipidemia 08/02/2010  . Hypertension 08/02/2010  . Osteoarthritis 08/02/2010  . BPH (benign prostatic hyperplasia) 08/02/2010  . GERD  (gastroesophageal reflux disease) 08/02/2010  . h/o Atrial flutter ([place]) s/p ablation 2012     Past Medical History:  Diagnosis Date  . Benign localized prostatic hyperplasia with lower urinary tract symptoms (LUTS)    urologist-- dr Diona Fanti  . Chronic anticoagulation    Xarelto for afib  . Chronic constipation   . Chronic cystitis   . Gross hematuria   . History of DVT of lower extremity 06/08/2010   right femoral dvt dx post op total hip 05-08-2010  . History of gastric ulcer    remote  . History of kidney stones    one stone. remote hx  . Hyperlipidemia   . Hypertension    Dx by Dr. Lance Muss around age  75   . NICM (nonischemic cardiomyopathy) ([place])    per echo 06-11-2017,  ef 45-50% with diffuse hypokinesis,  G1DD  . OSA on CPAP 2017   compliant with CPAP.  managed by Dr Elsworth Soho.  . Osteoarthritis   . Other urethral stricture, male, meatal    chronic;  pt self dilates  . Paroxysmal atrial fibrillation [facility]) cardiologist-- dr Rayann Heman   first dx 2009/   a. documented by Dr Barrett Shell on office EKG 9/12. b. maintained on Tikosyn and Xarelto. c. s/p DCCV 's.  d.  x3  EP w/ ablation atrial fib last one 12-05-2016  . Prediabetes   . Premature ventricular contraction   . PVC's (premature ventricular contractions)   . RA (rheumatoid arthritis) [facility])    rheumatologist--- Dr Page Spiro,  treated with Humira  . RBBB (right bundle branch block)    Hx of a fib, ablation Aug 2018  . Rheumatoid arthritis ([place])   . Vitamin D deficiency   . Wears hearing aid in both ears     Past Surgical History:  Procedure Laterality Date  . ABLATION OF DYSRHYTHMIC FOCUS  08/29/2015  . ATRIAL FIBRILLATION ABLATION  12/05/2016  . ATRIAL FIBRILLATION ABLATION N/A 12/05/2016   Procedure: Atrial Fibrillation Ablation;  Surgeon: [name], MD;  Location: [facility];  Service: Cardiovascular;  Laterality: N/A;  . ATRIAL FLUTTER ABLATION  09/2010   by Greggory Brandy  . BUNIONECTOMY Right 1990s  .  CARDIOVERSION N/A 10/28/2012   Procedure: CARDIOVERSION;  Surgeon: Burnell Blanks, MD;  Location: [facility];  Service: Cardiovascular;  Laterality: N/A;  . CATARACT EXTRACTION W/ INTRAOCULAR LENS  IMPLANT, BILATERAL  2019  . CYSTOSCOPY WITH BIOPSY N/A 09/03/2018   Procedure: CYSTOSCOPY WITH FULGURATION [id number];  Surgeon: Franchot Gallo, MD;  Location: [facility];  Service: Urology;  Laterality: N/A;  30 MINS  . ELECTROPHYSIOLOGIC STUDY N/A 08/29/2015   Procedure: Atrial Fibrillation Ablation;  Surgeon: [name], MD;  Location: [facility];  Service: Cardiovascular;  Laterality: N/A;  . KNEE SURGERY Bilateral x3   1960s & 1970s   open  . LEFT HEART CATHETERIZATION WITH CORONARY ANGIOGRAM N/A 06/07/2011   Procedure: LEFT HEART CATHETERIZATION WITH CORONARY ANGIOGRAM;  Surgeon: Peter M Martinique, MD;  Location: [facility];  Service: Cardiovascular;  Laterality: N/A;  Normal coronary arteries,  low normal LVF  . TONSILLECTOMY AND ADENOIDECTOMY  age 70  . TOTAL HIP ARTHROPLASTY Right 06/04/10    dr Noemi Chapel  @[facility]   . TOTAL KNEE ARTHROPLASTY Bilateral right 1995;  left 1997  . TRANSURETHRAL RESECTION OF PROSTATE  06-29-2002   dr humphries  @[facility]   . TRANSURETHRAL RESECTION OF PROSTATE N/A 07/06/2018   Procedure: TRANSURETHRAL RESECTION OF THE PROSTATE (TURP);  Surgeon: Franchot Gallo, MD;  [facility];  Service: Urology;  Laterality: N/A;  32 MINS    Social History   Tobacco Use  . Smoking status: Former Smoker    Packs/day: 1.00    Years: 30.00    Pack years: 30.00    Types: Cigarettes    Quit date: 05/06/1978    Years since quitting: 41.3  . Smokeless tobacco: Never Used  Substance Use Topics  . Alcohol use: Yes    Comment: occasionally wine and beer  . Drug use: Never    Family History  Problem Relation Age of Onset  . Heart attack Father   . Lung disease Mother   . Arrhythmia Sister   . Atrial fibrillation Brother   . Diabetes Neg Hx      Allergies  Allergen Reactions  . Penicillins Itching, Swelling and Other (See Comments)    Did it involve swelling of the face/tongue/throat, SOB, or low BP? No Did it involve sudden or severe rash/hives, skin peeling, or any reaction on the inside of your mouth or nose? No Did you need to seek medical attention at a hospital or doctor's office? No When did it last happen?30 + years If all above answers are "NO", may proceed with cephalosporin use.      Medication list has been reviewed and updated.  Current Outpatient Medications on File Prior to Visit  Medication Sig Dispense Refill  . acetaminophen (TYLENOL) 500 MG tablet Take 1,000-1,500 mg by mouth 2 (two) times daily as needed for moderate pain.     Marland Kitchen atorvastatin (LIPITOR) 10 MG tablet Take 1 tablet (10 mg total) by mouth daily. 90 tablet 3  . Carboxymethylcellulose Sodium (THERATEARS OP) Apply 1 drop to eye daily as needed (dry eyes).    . Cholecalciferol (VITAMIN D3) 5000 UNITS TABS Take 5,000 Units by mouth See admin instructions. Take 5000 units by mouth every 4 days    . clindamycin (CLEOCIN T) 1 % external solution Apply 1 application topically daily as needed (for bumps in scalp). Apply to scalp  2  . Cranberry 1000 MG CAPS Take 3,000 mg by mouth every evening.     . finasteride (PROSCAR) 5 MG tablet Take 5 mg by mouth every morning.   3  . HUMIRA PEN 40 MG/0.4ML PNKT Inject 40 mg as directed every 7 (seven) days. Patient states that he is taking  6  . lisinopril (ZESTRIL) 10 MG tablet Take 1 tablet (10 mg total) by mouth daily. 30 tablet 3  . metoprolol succinate (TOPROL-XL) 50 MG 24 hr tablet TAKE 1/2 TABLET IN THE MORNING AND TAKE 1 TABLET IN THE EVENING 60 tablet 0  . Multiple Vitamins-Minerals (PRESERVISION AREDS 2) CAPS Take 1 capsule by mouth 2 (two) times a day.    . Omega-3 Fatty Acids (OMEGA 3 PO) Take 3 capsules by mouth every evening.     Marland Kitchen omeprazole (PRILOSEC) 20 MG capsule Take 20 mg by mouth daily  as needed (acid reflux). Patient states that he takes the medication every 4 days    .  solifenacin (VESICARE) 10 MG tablet Take 10 mg by mouth daily.    . tamsulosin (FLOMAX) 0.4 MG CAPS capsule Take 1 capsule (0.4 mg total) by mouth daily after supper. 30 capsule 5  . vitamin B-12 (CYANOCOBALAMIN) 500 MCG tablet Take 500 mcg by mouth every evening.     Alveda Reasons 20 MG TABS tablet TAKE 1 TABLET BY MOUTH EVERY DAY IN THE EVENING 90 tablet 1  . metoprolol tartrate (LOPRESSOR) 25 MG tablet TAKE 1 TABLET BY MOUTH EVERY 6 HOURS AS NEEDED FOR FAST HEART RATES 90 tablet 2   No current facility-administered medications on file prior to visit.    Review of Systems:  As per HPI- otherwise negative.   Physical Examination: Vitals:   08/16/19 0853  BP: 136/82  Pulse: 76  Resp: 17  Temp: (!) 97.1 F (36.2 C)  SpO2: 94%   Vitals:   08/16/19 0853  Weight: 223 lb (101.2 kg)  Height: 6' (1.829 m)   Body mass index is 30.24 kg/m. Ideal Body Weight: Weight in (lb) to have BMI = 25: 183.9  GEN: no acute distress.  Hard of hearing, otherwise looks well HEENT: Atraumatic, Normocephalic.  Ears and Nose: No external deformity. CV: RRR, No M/G/R. No JVD. No thrill. No extra heart sounds. PULM: CTA B, no wheezes, crackles, rhonchi. No retractions. No resp. distress. No accessory muscle use. ABD: S, NT, ND, +BS. No rebound. No HSM. EXTR: No c/c.  He has soft pitting edema of both LE which is baseline and longstanding PSYCH: Normally interactive. Conversant.   BP Readings from Last 3 Encounters:  08/16/19 136/82  08/05/19 130/80  05/20/19 120/78    Assessment and Plan: Mixed hyperlipidemia  Essential hypertension  Urinary urgency - Plan: Urine Culture, POCT urinalysis dipstick, nitrofurantoin, macrocrystal-monohydrate, (MACROBID) 100 MG capsule  Iron deficiency anemia, unspecified iron deficiency anemia type - Plan: CBC, Ferritin  Elevated glucose - Plan: Hemoglobin A1c  Here today  with a few concerns.  He has noted urinary urgency the last day or so, urine dipstick is suspicious for UTI.  Urine culture is pending, will start on Macrobid for the time being.  We chose this agent due to allergy and safety profile as well as medication interactions A1c pending as above Blood pressure under good control, continue current dose of lisinopril Noted iron deficiency anemia in the past, will follow up on this today Moderate medical decision making This visit occurred during the SARS-CoV-2 public health emergency.  Safety protocols were in place, including screening questions prior to the visit, additional usage of staff PPE, and extensive cleaning of exam room while observing appropriate contact time as indicated for disinfecting solutions.   Signed Lamar Blinks, MD  Received his labs as below, message to patient  Results for orders placed or performed in visit on 08/16/19  Urine Culture   Specimen: Urine  Result Value Ref Range   MICRO NUMBER: [id number]    SPECIMEN QUALITY: Adequate    Sample Source NOT GIVEN    STATUS: FINAL    ISOLATE 1:      Growth of mixed flora was isolated, suggesting probable contamination. No further testing will be performed. If clinically indicated, recollection using a method to minimize contamination, with prompt transfer to Urine Culture Transport Tube, is  recommended.   CBC  Result Value Ref Range   WBC 4.4 4.0 - 10.5 K/uL   RBC 3.83 (L) 4.22 - 5.81 Mil/uL   Platelets 125.0 (L) 150.0 - 400.0 K/uL  Hemoglobin 13.2 13.0 - 17.0 g/dL   HCT 39.9 39.0 - 52.0 %   MCV 104.1 (H) 78.0 - 100.0 fl   MCHC 33.1 30.0 - 36.0 g/dL   RDW 14.7 11.5 - 15.5 %  Ferritin  Result Value Ref Range   Ferritin 35.8 22.0 - 322.0 ng/mL  Hemoglobin A1c  Result Value Ref Range   Hgb A1c MFr Bld 5.9 4.6 - 6.5 %  POCT urinalysis dipstick  Result Value Ref Range   Color, UA yellow yellow   Clarity, UA clear clear   Glucose, UA negative negative mg/dL    Bilirubin, UA negative negative   Ketones, POC UA negative negative mg/dL   Spec Grav, UA 1.015 1.010 - 1.025   Blood, UA small (A) negative   pH, UA 6.0 5.0 - 8.0   Protein Ur, POC trace (A) negative mg/dL   Urobilinogen, UA 0.2 0.2 or 1.0 E.U./dL   Nitrite, UA Negative Negative   Leukocytes, UA Moderate (2+) (A) Negative   4/15; Received urine culture - negative Message to pt  May stop abx Will fax copy to his urologist as well

## 2019-08-16 ENCOUNTER — Ambulatory Visit: Payer: Medicare PPO | Admitting: Family Medicine

## 2019-08-16 ENCOUNTER — Encounter: Payer: Self-pay | Admitting: Family Medicine

## 2019-08-16 ENCOUNTER — Other Ambulatory Visit: Payer: Self-pay

## 2019-08-16 VITALS — BP 136/82 | HR 76 | Temp 97.1°F | Resp 17 | Ht 72.0 in | Wt 223.0 lb

## 2019-08-16 DIAGNOSIS — R7309 Other abnormal glucose: Secondary | ICD-10-CM | POA: Diagnosis not present

## 2019-08-16 DIAGNOSIS — E782 Mixed hyperlipidemia: Secondary | ICD-10-CM

## 2019-08-16 DIAGNOSIS — D509 Iron deficiency anemia, unspecified: Secondary | ICD-10-CM | POA: Diagnosis not present

## 2019-08-16 DIAGNOSIS — I10 Essential (primary) hypertension: Secondary | ICD-10-CM

## 2019-08-16 DIAGNOSIS — R3915 Urgency of urination: Secondary | ICD-10-CM | POA: Diagnosis not present

## 2019-08-16 LAB — POCT URINALYSIS DIP (MANUAL ENTRY)
Bilirubin, UA: NEGATIVE
Glucose, UA: NEGATIVE mg/dL
Ketones, POC UA: NEGATIVE mg/dL
Nitrite, UA: NEGATIVE
Spec Grav, UA: 1.015 (ref 1.010–1.025)
Urobilinogen, UA: 0.2 E.U./dL
pH, UA: 6 (ref 5.0–8.0)

## 2019-08-16 LAB — CBC
HCT: 39.9 % (ref 39.0–52.0)
Hemoglobin: 13.2 g/dL (ref 13.0–17.0)
MCHC: 33.1 g/dL (ref 30.0–36.0)
MCV: 104.1 fl — ABNORMAL HIGH (ref 78.0–100.0)
Platelets: 125 10*3/uL — ABNORMAL LOW (ref 150.0–400.0)
RBC: 3.83 Mil/uL — ABNORMAL LOW (ref 4.22–5.81)
RDW: 14.7 % (ref 11.5–15.5)
WBC: 4.4 10*3/uL (ref 4.0–10.5)

## 2019-08-16 LAB — HEMOGLOBIN A1C: Hgb A1c MFr Bld: 5.9 % (ref 4.6–6.5)

## 2019-08-16 LAB — FERRITIN: Ferritin: 35.8 ng/mL (ref 22.0–322.0)

## 2019-08-16 MED ORDER — NITROFURANTOIN MONOHYD MACRO 100 MG PO CAPS: 100.0000 mg | ORAL_CAPSULE | Freq: Two times a day (BID) | ORAL | 0 refills | Status: DC

## 2019-08-16 NOTE — Progress Notes (Signed)
Cologuard ordered for patient.  

## 2019-08-16 NOTE — Patient Instructions (Addendum)
It was great to see you again today!   Continue your current dose of lisinopril I will check on your iron and average blood sugars today- will be in touch with this report We will also order a Cologuard kit for you to do at home- if this is negative nothing further is needed for colon cancer screening  We are running a urine culture for you and I will be in touch with this report asap

## 2019-08-18 ENCOUNTER — Telehealth: Payer: Medicare PPO | Admitting: Internal Medicine

## 2019-08-18 LAB — URINE CULTURE
MICRO NUMBER:: 10351577
SPECIMEN QUALITY:: ADEQUATE

## 2019-08-19 ENCOUNTER — Encounter: Payer: Self-pay | Admitting: Family Medicine

## 2019-08-20 ENCOUNTER — Encounter: Payer: Self-pay | Admitting: Family Medicine

## 2019-08-23 ENCOUNTER — Encounter: Payer: Self-pay | Admitting: Family Medicine

## 2019-08-23 DIAGNOSIS — Z1211 Encounter for screening for malignant neoplasm of colon: Secondary | ICD-10-CM | POA: Diagnosis not present

## 2019-08-23 DIAGNOSIS — D696 Thrombocytopenia, unspecified: Secondary | ICD-10-CM | POA: Diagnosis not present

## 2019-08-23 LAB — COLOGUARD

## 2019-08-26 LAB — COLOGUARD: COLOGUARD: NEGATIVE

## 2019-08-30 ENCOUNTER — Encounter: Payer: Self-pay | Admitting: Family Medicine

## 2019-09-04 ENCOUNTER — Other Ambulatory Visit (HOSPITAL_COMMUNITY): Payer: Self-pay | Admitting: Internal Medicine

## 2019-09-05 DIAGNOSIS — H353132 Nonexudative age-related macular degeneration, bilateral, intermediate dry stage: Secondary | ICD-10-CM | POA: Diagnosis not present

## 2019-09-08 DIAGNOSIS — L84 Corns and callosities: Secondary | ICD-10-CM | POA: Diagnosis not present

## 2019-09-08 DIAGNOSIS — L602 Onychogryphosis: Secondary | ICD-10-CM | POA: Diagnosis not present

## 2019-09-08 DIAGNOSIS — I739 Peripheral vascular disease, unspecified: Secondary | ICD-10-CM | POA: Diagnosis not present

## 2019-10-05 DIAGNOSIS — H353132 Nonexudative age-related macular degeneration, bilateral, intermediate dry stage: Secondary | ICD-10-CM | POA: Diagnosis not present

## 2019-10-14 DIAGNOSIS — D485 Neoplasm of uncertain behavior of skin: Secondary | ICD-10-CM | POA: Diagnosis not present

## 2019-10-14 DIAGNOSIS — L814 Other melanin hyperpigmentation: Secondary | ICD-10-CM | POA: Diagnosis not present

## 2019-10-14 DIAGNOSIS — D692 Other nonthrombocytopenic purpura: Secondary | ICD-10-CM | POA: Diagnosis not present

## 2019-10-14 DIAGNOSIS — L821 Other seborrheic keratosis: Secondary | ICD-10-CM | POA: Diagnosis not present

## 2019-10-14 DIAGNOSIS — D2271 Melanocytic nevi of right lower limb, including hip: Secondary | ICD-10-CM | POA: Diagnosis not present

## 2019-10-14 DIAGNOSIS — D2272 Melanocytic nevi of left lower limb, including hip: Secondary | ICD-10-CM | POA: Diagnosis not present

## 2019-10-14 DIAGNOSIS — D225 Melanocytic nevi of trunk: Secondary | ICD-10-CM | POA: Diagnosis not present

## 2019-10-14 DIAGNOSIS — L820 Inflamed seborrheic keratosis: Secondary | ICD-10-CM | POA: Diagnosis not present

## 2019-10-14 DIAGNOSIS — D1801 Hemangioma of skin and subcutaneous tissue: Secondary | ICD-10-CM | POA: Diagnosis not present

## 2019-10-20 DIAGNOSIS — M2550 Pain in unspecified joint: Secondary | ICD-10-CM | POA: Diagnosis not present

## 2019-10-20 DIAGNOSIS — E663 Overweight: Secondary | ICD-10-CM | POA: Diagnosis not present

## 2019-10-20 DIAGNOSIS — Z79899 Other long term (current) drug therapy: Secondary | ICD-10-CM | POA: Diagnosis not present

## 2019-10-20 DIAGNOSIS — M150 Primary generalized (osteo)arthritis: Secondary | ICD-10-CM | POA: Diagnosis not present

## 2019-10-20 DIAGNOSIS — Z6829 Body mass index (BMI) 29.0-29.9, adult: Secondary | ICD-10-CM | POA: Diagnosis not present

## 2019-10-20 DIAGNOSIS — M0600 Rheumatoid arthritis without rheumatoid factor, unspecified site: Secondary | ICD-10-CM | POA: Diagnosis not present

## 2019-10-20 LAB — COMPREHENSIVE METABOLIC PANEL
Calcium: 9 (ref 8.7–10.7)
GFR calc non Af Amer: 84

## 2019-10-20 LAB — CBC AND DIFFERENTIAL
HCT: 42 (ref 41–53)
Hemoglobin: 13.8 (ref 13.5–17.5)
Platelets: 141 — AB (ref 150–399)
WBC: 4

## 2019-10-20 LAB — BASIC METABOLIC PANEL
BUN: 18 (ref 4–21)
Creatinine: 0.8 (ref 0.6–1.3)
Potassium: 3.9 (ref 3.4–5.3)
Sodium: 146 (ref 137–147)

## 2019-10-20 LAB — HEPATIC FUNCTION PANEL
ALT: 15 (ref 10–40)
AST: 20 (ref 14–40)
Alkaline Phosphatase: 73 (ref 25–125)
Bilirubin, Total: 0.8

## 2019-10-25 ENCOUNTER — Other Ambulatory Visit: Payer: Self-pay | Admitting: Family Medicine

## 2019-10-29 DIAGNOSIS — Z5111 Encounter for antineoplastic chemotherapy: Secondary | ICD-10-CM | POA: Diagnosis not present

## 2019-10-29 DIAGNOSIS — C61 Malignant neoplasm of prostate: Secondary | ICD-10-CM | POA: Diagnosis not present

## 2019-10-29 DIAGNOSIS — R6882 Decreased libido: Secondary | ICD-10-CM | POA: Diagnosis not present

## 2019-11-04 DIAGNOSIS — H353132 Nonexudative age-related macular degeneration, bilateral, intermediate dry stage: Secondary | ICD-10-CM | POA: Diagnosis not present

## 2019-11-10 ENCOUNTER — Other Ambulatory Visit: Payer: Self-pay | Admitting: Family Medicine

## 2019-11-10 DIAGNOSIS — E785 Hyperlipidemia, unspecified: Secondary | ICD-10-CM

## 2019-11-12 ENCOUNTER — Encounter: Payer: Self-pay | Admitting: Family Medicine

## 2019-11-21 NOTE — Progress Notes (Signed)
[facility] [place], [place], [place] [address] [phone]  Date:  11/24/2019   Name:  Connor [name]   DOB:  1933-08-03   MRN:  [id number]  PCP:  Darreld Mclean, MD    Chief Complaint: Annual Exam   History of Present Illness:  Connor [name] is a 86 y.o. very pleasant male patient who presents with the following: Here today for a CPE Gentleman with history of atrial fibrillation, left ventricle dysfunction, prostate cancer, seronegative rheumatoid arthritis and osteoarthritis He is taking his xarelto for a fib, which is intermittent No CP or pressure  Last seen by myself in April - he notes no major changes in that time period He notes that his hands and fingers are not as strong due to RA- his rheum is Dr Trudie Reed He is using humira weekly- he does a home injection in his leg   covid UTD shingrix- per pt this was done [facility] at [facility] Colon cancer screening - recent cologuard negative   Married to [place]   He brings me a card from urology dated 6/29- his PSA is undetectable His cardiologist is Dr Verlin Fester is overdue for follow-up, plans to see him soon  He is trying to walk for exercise- exercise is a bit more challenging due to the gym being closed due to pandemic He is using some flax seeds for hot flashes as recommended by his urologist  He is a former smoker but quit 40 years ago  Patient Active Problem List   Diagnosis Date Noted  . Malignant neoplasm of prostate ([facility]) 09/11/2018  . Enlarged prostate with urinary obstruction 07/06/2018  . Gastrocnemius strain, right, initial encounter 12/10/2017  . Baker's cyst of knee, right 12/10/2017  . Obesity 03/24/2017  . Near syncope 04/20/2016  . Leg hematoma, left, initial encounter 04/20/2016  . Bleeding   . Ecchymosis   . Left hamstring muscle strain   . Muscle tear   . RBBB 08/30/2015  . Chronic systolic dysfunction of left  ventricle 07/11/2014  . OSA (obstructive sleep apnea) 04/07/2013  . Multinodular goiter 01/20/2013  . Cervical spondylosis without myelopathy 01/18/2013  . Atrial fibrillation with RVR ([facility]) 10/27/2012  . PAF (paroxysmal atrial fibrillation) ([facility]) 05/28/2012  . Left ventricular dysfunction 12/17/2011  . Low back pain 09/11/2011  . Fatigue 05/22/2011  . PVC (premature ventricular contraction) 01/18/2011  . Persistent atrial fibrillation ([facility]) 01/12/2011  . Hyperlipidemia 08/02/2010  . Hypertension 08/02/2010  . Osteoarthritis 08/02/2010  . BPH (benign prostatic hyperplasia) 08/02/2010  . GERD (gastroesophageal reflux disease) 08/02/2010  . h/o Atrial flutter ([facility]) s/p ablation 2012     Past Medical History:  Diagnosis Date  . Benign localized prostatic hyperplasia with lower urinary tract symptoms (LUTS)    urologist-- dr Diona Fanti  . Chronic anticoagulation    Xarelto for afib  . Chronic constipation   . Chronic cystitis   . Gross hematuria   . History of DVT of lower extremity 06/08/2010   right femoral dvt dx post op total hip 05-08-2010  . History of gastric ulcer    remote  . History of kidney stones    one stone. remote hx  . Hyperlipidemia   . Hypertension    Dx by Dr. Lance Muss around age  68   . NICM (nonischemic cardiomyopathy) ([facility])    per echo 06-11-2017,  ef 45-50% with diffuse hypokinesis,  G1DD  .  OSA on CPAP 2017   compliant with CPAP.  managed by Dr Elsworth Soho.  . Osteoarthritis   . Other urethral stricture, male, meatal    chronic;  pt self dilates  . Paroxysmal atrial fibrillation [facility]) cardiologist-- dr Rayann Heman   first dx 2009/   a. documented by Dr Barrett [place] on office EKG 9/12. b. maintained on Tikosyn and Xarelto. c. s/p DCCV 's.  d.  x3  EP w/ ablation atrial fib last one 12-05-2016  . Prediabetes   . Premature ventricular contraction   . PVC's (premature ventricular contractions)   . RA (rheumatoid arthritis) [facility])    rheumatologist--- Dr Page Spiro,   treated with Humira  . RBBB (right bundle branch block)    Hx of a fib, ablation Aug 2018  . Rheumatoid arthritis ([facility])   . Vitamin D deficiency   . Wears hearing aid in both ears     Past Surgical History:  Procedure Laterality Date  . ABLATION OF DYSRHYTHMIC FOCUS  08/29/2015  . ATRIAL FIBRILLATION ABLATION  12/05/2016  . ATRIAL FIBRILLATION ABLATION N/A 12/05/2016   Procedure: Atrial Fibrillation Ablation;  Surgeon: [name], MD;  Location: [facility];  Service: Cardiovascular;  Laterality: N/A;  . ATRIAL FLUTTER ABLATION  09/2010   by Greggory Brandy  . BUNIONECTOMY Right 1990s  . CARDIOVERSION N/A 10/28/2012   Procedure: CARDIOVERSION;  Surgeon: Burnell Blanks, MD;  Location: [facility];  Service: Cardiovascular;  Laterality: N/A;  . CATARACT EXTRACTION W/ INTRAOCULAR LENS  IMPLANT, BILATERAL  2019  . CYSTOSCOPY WITH BIOPSY N/A 09/03/2018   Procedure: CYSTOSCOPY WITH FULGURATION [id number];  Surgeon: Franchot Gallo, MD;  Location: [facility];  Service: Urology;  Laterality: N/A;  30 MINS  . ELECTROPHYSIOLOGIC STUDY N/A 08/29/2015   Procedure: Atrial Fibrillation Ablation;  Surgeon: [name], MD;  Location: [facility];  Service: Cardiovascular;  Laterality: N/A;  . KNEE SURGERY Bilateral x3   1960s & 1970s   open  . LEFT HEART CATHETERIZATION WITH CORONARY ANGIOGRAM N/A 06/07/2011   Procedure: LEFT HEART CATHETERIZATION WITH CORONARY ANGIOGRAM;  Surgeon: Peter M Martinique, MD;  Location: [facility] C[facility];  Service: Cardiovascular;  Laterality: N/A;    Normal coronary arteries,  low normal LVF  . TONSILLECTOMY AND ADENOIDECTOMY  age 10  . TOTAL HIP ARTHROPLASTY Right 06/04/10    dr Noemi Chapel  @[facility]   . TOTAL KNEE ARTHROPLASTY Bilateral right 1995;  left 1997  . TRANSURETHRAL RESECTION OF PROSTATE  06-29-2002   dr humphries  @[facility]   . TRANSURETHRAL RESECTION OF PROSTATE N/A 07/06/2018   Procedure: TRANSURETHRAL RESECTION OF THE PROSTATE (TURP);  Surgeon: Franchot Gallo, MD;  Location:  [facility];  Service: Urology;  Laterality: N/A;  72 MINS    Social History   Tobacco Use  . Smoking status: Former Smoker    Packs/day: 1.00    Years: 30.00    Pack years: 30.00    Types: Cigarettes    Quit date: 05/06/1978    Years since quitting: 41.5  . Smokeless tobacco: Never Used  Vaping Use  . Vaping Use: Never used  Substance Use Topics  . Alcohol use: Yes    Comment: occasionally wine and beer  . Drug use: Never    Family History  Problem Relation Age of Onset  . Heart attack Father   . Lung disease Mother   . Arrhythmia Sister   . Atrial fibrillation Brother   . Diabetes Neg Hx     Allergies  Allergen  Reactions  . Penicillins Itching, Swelling and Other (See Comments)    Did it involve swelling of the face/tongue/throat, SOB, or low BP? No Did it involve sudden or severe rash/hives, skin peeling, or any reaction on the inside of your mouth or nose? No Did you need to seek medical attention at a hospital or doctor's office? No When did it last happen?30 + years If all above answers are "NO", may proceed with cephalosporin use.      Medication list has been reviewed and updated.  Current Outpatient Medications on File Prior to Visit  Medication Sig Dispense Refill  . acetaminophen (TYLENOL) 500 MG tablet Take 1,000-1,500 mg by mouth 2 (two) times daily as needed for moderate pain.     Marland Kitchen atorvastatin (LIPITOR) 10 MG tablet Take 1 tablet (10 mg total) by mouth daily. 90 tablet 3  . Carboxymethylcellulose Sodium (THERATEARS OP) Apply 1 drop to eye daily as needed (dry eyes).    . Cholecalciferol (VITAMIN D3) 5000 UNITS TABS Take 5,000 Units by mouth See admin instructions. Take 5000 units by mouth every 4 days    . clindamycin (CLEOCIN T) 1 % external solution Apply 1 application topically daily as needed (for bumps in scalp). Apply to scalp  2  . Cranberry 1000 MG CAPS Take 3,000 mg by mouth every evening.     . finasteride (PROSCAR) 5  MG tablet Take 5 mg by mouth every morning.   3  . HUMIRA PEN 40 MG/0.4ML PNKT Inject 40 mg as directed every 7 (seven) days. Patient states that he is taking  6  . lisinopril (ZESTRIL) 10 MG tablet TAKE 1 TABLET BY MOUTH EVERY DAY 90 tablet 0  . Metoprolol Succinate 50 MG CS24 Take 50 mg by mouth. Whole tablet in evenng and half tablet in morning    . metoprolol tartrate (LOPRESSOR) 25 MG tablet TAKE 1 TABLET BY MOUTH EVERY 6 HOURS AS NEEDED FOR FAST HEART RATES 90 tablet 2  . Multiple Vitamins-Minerals (PRESERVISION AREDS 2) CAPS Take 1 capsule by mouth 2 (two) times a day.    . Omega-3 Fatty Acids (OMEGA 3 PO) Take 3 capsules by mouth every evening.     Marland Kitchen omeprazole (PRILOSEC) 20 MG capsule Take 20 mg by mouth daily as needed (acid reflux). Patient states that he takes the medication every 4 days    . solifenacin (VESICARE) 10 MG tablet Take 10 mg by mouth daily.    . tamsulosin (FLOMAX) 0.4 MG CAPS capsule Take 1 capsule (0.4 mg total) by mouth daily after supper. 30 capsule 5  . vitamin B-12 (CYANOCOBALAMIN) 500 MCG tablet Take 500 mcg by mouth every evening.     Alveda Reasons 20 MG TABS tablet TAKE 1 TABLET BY MOUTH EVERY DAY IN THE EVENING 90 tablet 1   No current facility-administered medications on file prior to visit.    Review of Systems:  As per HPI- otherwise negative.   Physical Examination: Vitals:   11/24/19 1300  BP: 128/80  Pulse: 83  Resp: 17  SpO2: 98%   Vitals:   11/24/19 1300  Weight: 223 lb (101.2 kg)  Height: 6' (1.829 m)   Body mass index is 30.24 kg/m. Ideal Body Weight: Weight in (lb) to have BMI = 25: 183.9  GEN: no acute distress. Tall build, looks well and younger than age 66: Atraumatic, Normocephalic.  Bilateral TM wnl, oropharynx normal.  PEERL,EOMI.   Ears and Nose: No external deformity. CV: RRR, No M/G/R. No  JVD. No thrill. No extra heart sounds. PULM: CTA B, no wheezes, crackles, rhonchi. No retractions. No resp. distress. No accessory  muscle use. ABD: S, NT, ND, +BS. No rebound. No HSM. EXTR: No c/c.  He has trace to 1+ chronic edema of the lower extremities.  Patient notes this can be controlled with compression socks but he does not always want to wear them.  His legs do not cause him any discomfort PSYCH: Normally interactive. Conversant.    Assessment and Plan: Physical exam  Mixed hyperlipidemia  Essential hypertension  Malignant neoplasm of prostate ([facility])   Here today for routine physical.  Patient is overall doing well, does not need lab work today as he is up-to-date Blood pressure under good control Atrial fibrillation on proper anticoagulation He is seeing urology for his prostate cancer Encouraged him to continue exercise and healthy diet Plan to visit in 6 months This visit occurred during the SARS-CoV-2 public health emergency.  Safety protocols were in place, including screening questions prior to the visit, additional usage of staff PPE, and extensive cleaning of exam room while observing appropriate contact time as indicated for disinfecting solutions.    Signed Lamar Blinks, MD

## 2019-11-22 NOTE — Patient Instructions (Addendum)
It was good to see you again today!    Please see me in about 6 months for our next visit- be careful of the heat when you are out exercising.      Health Maintenance After Age 84 After age 75, you are at a higher risk for certain long-term diseases and infections as well as injuries from falls. Falls are a major cause of broken bones and head injuries in people who are older than age 27. Getting regular preventive care can help to keep you healthy and well. Preventive care includes getting regular testing and making lifestyle changes as recommended by your health care provider. Talk with your health care provider about:  Which screenings and tests you should have. A screening is a test that checks for a disease when you have no symptoms.  A diet and exercise plan that is right for you. What should I know about screenings and tests to prevent falls? Screening and testing are the best ways to find a health problem early. Early diagnosis and treatment give you the best chance of managing medical conditions that are common after age 61. Certain conditions and lifestyle choices may make you more likely to have a fall. Your health care provider may recommend:  Regular vision checks. Poor vision and conditions such as cataracts can make you more likely to have a fall. If you wear glasses, make sure to get your prescription updated if your vision changes.  Medicine review. Work with your health care provider to regularly review all of the medicines you are taking, including over-the-counter medicines. Ask your health care provider about any side effects that may make you more likely to have a fall. Tell your health care provider if any medicines that you take make you feel dizzy or sleepy.  Osteoporosis screening. Osteoporosis is a condition that causes the bones to get weaker. This can make the bones weak and cause them to break more easily.  Blood pressure screening. Blood pressure changes and  medicines to control blood pressure can make you feel dizzy.  Strength and balance checks. Your health care provider may recommend certain tests to check your strength and balance while standing, walking, or changing positions.  Foot health exam. Foot pain and numbness, as well as not wearing proper footwear, can make you more likely to have a fall.  Depression screening. You may be more likely to have a fall if you have a fear of falling, feel emotionally low, or feel unable to do activities that you used to do.  Alcohol use screening. Using too much alcohol can affect your balance and may make you more likely to have a fall. What actions can I take to lower my risk of falls? General instructions  Talk with your health care provider about your risks for falling. Tell your health care provider if: ? You fall. Be sure to tell your health care provider about all falls, even ones that seem minor. ? You feel dizzy, sleepy, or off-balance.  Take over-the-counter and prescription medicines only as told by your health care provider. These include any supplements.  Eat a healthy diet and maintain a healthy weight. A healthy diet includes low-fat dairy products, low-fat (lean) meats, and fiber from whole grains, beans, and lots of fruits and vegetables. Home safety  Remove any tripping hazards, such as rugs, cords, and clutter.  Install safety equipment such as grab bars in bathrooms and safety rails on stairs.  Keep rooms and walkways well-lit. Activity  Follow a regular exercise program to stay fit. This will help you maintain your balance. Ask your health care provider what types of exercise are appropriate for you.  If you need a cane or walker, use it as recommended by your health care provider.  Wear supportive shoes that have nonskid soles. Lifestyle  Do not drink alcohol if your health care provider tells you not to drink.  If you drink alcohol, limit how much you have: ? 0-1  drink a day for women. ? 0-2 drinks a day for men.  Be aware of how much alcohol is in your drink. In the [place] one drink equals one typical bottle of beer (12 oz), one-half glass of wine (5 oz), or one shot of hard liquor (1 oz).  Do not use any products that contain nicotine or tobacco, such as cigarettes and e-cigarettes. If you need help quitting, ask your health care provider. Summary  Having a healthy lifestyle and getting preventive care can help to protect your health and wellness after age 19.  Screening and testing are the best way to find a health problem early and help you avoid having a fall. Early diagnosis and treatment give you the best chance for managing medical conditions that are more common for people who are older than age 33.  Falls are a major cause of broken bones and head injuries in people who are older than age 49. Take precautions to prevent a fall at home.  Work with your health care provider to learn what changes you can make to improve your health and wellness and to prevent falls. This information is not intended to replace advice given to you by your health care provider. Make sure you discuss any questions you have with your health care provider. Document Revised: 08/13/2018 Document Reviewed: 03/05/2017 Elsevier Patient Education  2020 [employer].

## 2019-11-23 ENCOUNTER — Encounter: Payer: Self-pay | Admitting: Family Medicine

## 2019-11-24 ENCOUNTER — Encounter: Payer: Self-pay | Admitting: Family Medicine

## 2019-11-24 ENCOUNTER — Ambulatory Visit (INDEPENDENT_AMBULATORY_CARE_PROVIDER_SITE_OTHER): Payer: Medicare PPO | Admitting: Family Medicine

## 2019-11-24 ENCOUNTER — Other Ambulatory Visit: Payer: Self-pay

## 2019-11-24 VITALS — BP 128/80 | HR 83 | Resp 17 | Ht 72.0 in | Wt 223.0 lb

## 2019-11-24 DIAGNOSIS — C61 Malignant neoplasm of prostate: Secondary | ICD-10-CM | POA: Diagnosis not present

## 2019-11-24 DIAGNOSIS — E782 Mixed hyperlipidemia: Secondary | ICD-10-CM | POA: Diagnosis not present

## 2019-11-24 DIAGNOSIS — I10 Essential (primary) hypertension: Secondary | ICD-10-CM

## 2019-11-24 DIAGNOSIS — Z0000 Encounter for general adult medical examination without abnormal findings: Secondary | ICD-10-CM | POA: Diagnosis not present

## 2019-12-01 ENCOUNTER — Encounter: Payer: Self-pay | Admitting: Family Medicine

## 2019-12-04 DIAGNOSIS — H353132 Nonexudative age-related macular degeneration, bilateral, intermediate dry stage: Secondary | ICD-10-CM | POA: Diagnosis not present

## 2019-12-17 ENCOUNTER — Other Ambulatory Visit: Payer: Self-pay | Admitting: Internal Medicine

## 2019-12-20 DIAGNOSIS — L602 Onychogryphosis: Secondary | ICD-10-CM | POA: Diagnosis not present

## 2019-12-20 DIAGNOSIS — I739 Peripheral vascular disease, unspecified: Secondary | ICD-10-CM | POA: Diagnosis not present

## 2019-12-20 DIAGNOSIS — L84 Corns and callosities: Secondary | ICD-10-CM | POA: Diagnosis not present

## 2019-12-20 NOTE — Telephone Encounter (Signed)
Last OV 08/05/19 Scr 0.76 on 10/20/19 101.2kg ABW CrCl= 101 Xarelto 20mg  sent to pharmacy

## 2019-12-31 DIAGNOSIS — H35033 Hypertensive retinopathy, bilateral: Secondary | ICD-10-CM | POA: Diagnosis not present

## 2019-12-31 DIAGNOSIS — H40013 Open angle with borderline findings, low risk, bilateral: Secondary | ICD-10-CM | POA: Diagnosis not present

## 2019-12-31 DIAGNOSIS — H35013 Changes in retinal vascular appearance, bilateral: Secondary | ICD-10-CM | POA: Diagnosis not present

## 2019-12-31 DIAGNOSIS — H353132 Nonexudative age-related macular degeneration, bilateral, intermediate dry stage: Secondary | ICD-10-CM | POA: Diagnosis not present

## 2019-12-31 DIAGNOSIS — H524 Presbyopia: Secondary | ICD-10-CM | POA: Diagnosis not present

## 2020-01-03 DIAGNOSIS — H353132 Nonexudative age-related macular degeneration, bilateral, intermediate dry stage: Secondary | ICD-10-CM | POA: Diagnosis not present

## 2020-01-11 ENCOUNTER — Other Ambulatory Visit: Payer: Self-pay | Admitting: Family Medicine

## 2020-02-01 ENCOUNTER — Telehealth: Payer: Self-pay | Admitting: *Deleted

## 2020-02-01 NOTE — Telephone Encounter (Signed)
Per patient, he has been in afib for the past 20 days. Since he already has a follow up with Dr. Rayann Heman tomorrow, will defer to MD regarding final clearance. Callback pool to include cardiac clearance to the reason behind tomorrow's visit.

## 2020-02-01 NOTE — Telephone Encounter (Signed)
Patient with diagnosis of A Fib on Xarelto for anticoagulation.    Procedure: Periodontal surgery, no extractions Date of procedure: TBD  CHADS2-VASc score of  4 (CHF, HTN, AGE x 2)  CrCl 86 mL/min using adjusted body weight Platelet count 141K  Per office protocol, patient can hold Xarelto for 1 day prior to procedure.

## 2020-02-01 NOTE — Telephone Encounter (Signed)
   [facility] Pre-operative Risk Assessment    HEARTCARE STAFF: - Please ensure there is not already an duplicate clearance open for this procedure. - Under Visit Info/Reason for Call, type in Other and utilize the format Clearance MM/DD/YY or Clearance TBD. Do not use dashes or single digits. - If request is for dental extraction, please clarify the # of teeth to be extracted.  Request for surgical clearance:  1. What type of surgery is being performed? Periodontal surgery, no extractions  2. When is this surgery scheduled? TBD   3. What type of clearance is required (medical clearance vs. Pharmacy clearance to hold med vs. Both)? Both  4. Are there any medications that need to be held prior to surgery and how long?Xarelto 20 mg  5. Practice name and name of physician performing surgery? [facility]; Iven Finn, DMD   6. What is the office phone number? [phone]   7.   What is the office fax number? [phone]  8.   Anesthesia type (None, local, MAC, general) ? General with lidocaine   Royann Wildasin L 02/01/2020, 11:09 AM  _________________________________________________________________   (provider comments below)

## 2020-02-02 ENCOUNTER — Encounter: Payer: Self-pay | Admitting: Internal Medicine

## 2020-02-02 ENCOUNTER — Encounter: Payer: Self-pay | Admitting: Family Medicine

## 2020-02-02 ENCOUNTER — Other Ambulatory Visit: Payer: Self-pay

## 2020-02-02 ENCOUNTER — Ambulatory Visit: Payer: Medicare PPO | Admitting: Internal Medicine

## 2020-02-02 VITALS — BP 124/84 | HR 84 | Ht 72.0 in | Wt 224.6 lb

## 2020-02-02 DIAGNOSIS — I428 Other cardiomyopathies: Secondary | ICD-10-CM

## 2020-02-02 DIAGNOSIS — H353132 Nonexudative age-related macular degeneration, bilateral, intermediate dry stage: Secondary | ICD-10-CM | POA: Diagnosis not present

## 2020-02-02 DIAGNOSIS — I480 Paroxysmal atrial fibrillation: Secondary | ICD-10-CM

## 2020-02-02 DIAGNOSIS — G4733 Obstructive sleep apnea (adult) (pediatric): Secondary | ICD-10-CM | POA: Diagnosis not present

## 2020-02-02 DIAGNOSIS — I10 Essential (primary) hypertension: Secondary | ICD-10-CM | POA: Diagnosis not present

## 2020-02-02 NOTE — Patient Instructions (Addendum)
Medication Instructions:  Your physician recommends that you continue on your current medications as directed. Please refer to the Current Medication list given to you today.  *If you need a refill on your cardiac medications before your next appointment, please call your pharmacy*  Lab Work: None ordered.  If you have labs (blood work) drawn today and your tests are completely normal, you will receive your results only by: Marland Kitchen MyChart Message (if you have MyChart) OR . A paper copy in the mail If you have any lab test that is abnormal or we need to change your treatment, we will call you to review the results.  Testing/Procedures: Please schedule ECHO Your physician has requested that you have an echocardiogram. Echocardiography is a painless test that uses sound waves to create images of your heart. It provides your doctor with information about the size and shape of your heart and how well your heart's chambers and valves are working. This procedure takes approximately one hour. There are no restrictions for this procedure.   Follow-Up: At [facility], you and your health needs are our priority.  As part of our continuing mission to provide you with exceptional heart care, we have created designated Provider Care Teams.  These Care Teams include your primary Cardiologist (physician) and Advanced Practice Providers (APPs -  Physician Assistants and Nurse Practitioners) who all work together to provide you with the care you need, when you need it.  We recommend signing up for the patient portal called "MyChart".  Sign up information is provided on this After Visit Summary.  MyChart is used to connect with patients for Virtual Visits (Telemedicine).  Patients are able to view lab/test results, encounter notes, upcoming appointments, etc.  Non-urgent messages can be sent to your provider as well.   To learn more about what you can do with MyChart, go to [email or web address].    Your next  appointment:   Your physician wants you to follow-up in: 3 months with Dr. Rayann Heman.    Other Instructions:

## 2020-02-02 NOTE — H&P (View-Only) (Signed)
PCP: Darreld Mclean, MD   Primary EP: Dr Nena Polio III is a 86 y.o. male who presents today for routine electrophysiology followup.  Since last being seen in our clinic, the patient reports doing very well.  He is now persistently in afib.  + palpitations and fatigue.   Today, he denies symptoms of palpitations, chest pain, shortness of breath,  lower extremity edema, dizziness, presyncope, or syncope.  The patient is otherwise without complaint today.   Past Medical History:  Diagnosis Date  . Benign localized prostatic hyperplasia with lower urinary tract symptoms (LUTS)    urologist-- dr Diona Fanti  . Chronic anticoagulation    Xarelto for afib  . Chronic constipation   . Chronic cystitis   . Gross hematuria   . History of DVT of lower extremity 06/08/2010   right femoral dvt dx post op total hip 05-08-2010  . History of gastric ulcer    remote  . History of kidney stones    one stone. remote hx  . Hyperlipidemia   . Hypertension    Dx by Dr. Lance Muss around age  4   . NICM (nonischemic cardiomyopathy) ([place])    per echo 06-11-2017,  ef 45-50% with diffuse hypokinesis,  G1DD  . OSA on CPAP 2017   compliant with CPAP.  managed by Dr Elsworth Soho.  . Osteoarthritis   . Other urethral stricture, male, meatal    chronic;  pt self dilates  . Paroxysmal atrial fibrillation [facility]) cardiologist-- dr Rayann Heman   first dx 2009/   a. documented by Dr Barrett Shell on office EKG 9/12. b. maintained on Tikosyn and Xarelto. c. s/p DCCV 's.  d.  x3  EP w/ ablation atrial fib last one 12-05-2016  . Prediabetes   . Premature ventricular contraction   . PVC's (premature ventricular contractions)   . RA (rheumatoid arthritis) [facility])    rheumatologist--- Dr Page Spiro,  treated with Humira  . RBBB (right bundle branch block)    Hx of a fib, ablation Aug 2018  . Rheumatoid arthritis ([place])   . Vitamin D deficiency   . Wears hearing aid in both ears    Past Surgical History:  Procedure  Laterality Date  . ABLATION OF DYSRHYTHMIC FOCUS  08/29/2015  . ATRIAL FIBRILLATION ABLATION  12/05/2016  . ATRIAL FIBRILLATION ABLATION N/A 12/05/2016   Procedure: Atrial Fibrillation Ablation;  Surgeon: [name], MD;  Location: [place] CV LAB;  Service: Cardiovascular;  Laterality: N/A;  . ATRIAL FLUTTER ABLATION  09/2010   by Greggory Brandy  . BUNIONECTOMY Right 1990s  . CARDIOVERSION N/A 10/28/2012   Procedure: CARDIOVERSION;  Surgeon: Burnell Blanks, MD;  Location: [place];  Service: Cardiovascular;  Laterality: N/A;  . CATARACT EXTRACTION W/ INTRAOCULAR LENS  IMPLANT, BILATERAL  2019  . CYSTOSCOPY WITH BIOPSY N/A 09/03/2018   Procedure: CYSTOSCOPY WITH FULGURATION [id number];  Surgeon: Franchot Gallo, MD;  Location: [facility];  Service: Urology;  Laterality: N/A;  30 MINS  . ELECTROPHYSIOLOGIC STUDY N/A 08/29/2015   Procedure: Atrial Fibrillation Ablation;  Surgeon: [name], MD;  Location: [place] CV LAB;  Service: Cardiovascular;  Laterality: N/A;  . KNEE SURGERY Bilateral x3   1960s & 1970s   open  . LEFT HEART CATHETERIZATION WITH CORONARY ANGIOGRAM N/A 06/07/2011   Procedure: LEFT HEART CATHETERIZATION WITH CORONARY ANGIOGRAM;  Surgeon: Peter M Martinique, MD;  Location: [facility] CATH LAB;  Service: Cardiovascular;  Laterality: N/A;    Normal coronary arteries,  low  normal LVF  . TONSILLECTOMY AND ADENOIDECTOMY  age 38  . TOTAL HIP ARTHROPLASTY Right 06/04/10    dr Noemi Chapel  @[facility]   . TOTAL KNEE ARTHROPLASTY Bilateral right 1995;  left 1997  . TRANSURETHRAL RESECTION OF PROSTATE  06-29-2002   dr humphries  @[facility]   . TRANSURETHRAL RESECTION OF PROSTATE N/A 07/06/2018   Procedure: TRANSURETHRAL RESECTION OF THE PROSTATE (TURP);  Surgeon: Franchot Gallo, MD;  Location: [facility];  Service: Urology;  Laterality: N/A;  26 MINS    ROS- all systems are reviewed and negatives except as per HPI above  Current Outpatient Medications  Medication Sig Dispense Refill  .  acetaminophen (TYLENOL) 500 MG tablet Take 1,000-1,500 mg by mouth 2 (two) times daily as needed for moderate pain.     Marland Kitchen atorvastatin (LIPITOR) 10 MG tablet Take 1 tablet (10 mg total) by mouth daily. 90 tablet 3  . Carboxymethylcellulose Sodium (THERATEARS OP) Apply 1 drop to eye daily as needed (dry eyes).    . Cholecalciferol (VITAMIN D3) 5000 UNITS TABS Take 5,000 Units by mouth See admin instructions. Take 5000 units by mouth every 4 days    . clindamycin (CLEOCIN T) 1 % external solution Apply 1 application topically daily as needed (for bumps in scalp). Apply to scalp  2  . Cranberry 1000 MG CAPS Take 3,000 mg by mouth every evening.     Mariane Baumgarten Calcium (STOOL SOFTENER PO) Take 4 tablets by mouth daily.    . finasteride (PROSCAR) 5 MG tablet Take 5 mg by mouth every morning.   3  . HUMIRA PEN 40 MG/0.4ML PNKT Inject 40 mg as directed every 7 (seven) days. Patient states that he is taking  6  . lisinopril (ZESTRIL) 10 MG tablet TAKE 1 TABLET BY MOUTH EVERY DAY 90 tablet 1  . Metoprolol Succinate 50 MG CS24 Take 50 mg by mouth. Whole tablet in evenng and half tablet in morning    . metoprolol tartrate (LOPRESSOR) 25 MG tablet TAKE 1 TABLET BY MOUTH EVERY 6 HOURS AS NEEDED FOR FAST HEART RATES 90 tablet 2  . Multiple Vitamins-Minerals (PRESERVISION AREDS 2) CAPS Take 1 capsule by mouth 2 (two) times a day.    . Omega-3 Fatty Acids (OMEGA 3 PO) Take 3 capsules by mouth every evening.     Marland Kitchen omeprazole (PRILOSEC) 20 MG capsule Take 20 mg by mouth daily as needed (acid reflux). Patient states that he takes the medication every 4 days    . solifenacin (VESICARE) 10 MG tablet Take 10 mg by mouth daily.    . tamsulosin (FLOMAX) 0.4 MG CAPS capsule Take 1 capsule (0.4 mg total) by mouth daily after supper. 30 capsule 5  . vitamin B-12 (CYANOCOBALAMIN) 500 MCG tablet Take 500 mcg by mouth every evening.     Alveda Reasons 20 MG TABS tablet TAKE 1 TABLET BY MOUTH EVERY DAY IN THE EVENING 90 tablet 1    No current facility-administered medications for this visit.    Physical Exam: Vitals:   02/02/20 1135  BP: 124/84  Pulse: 84  SpO2: 96%  Weight: 224 lb 9.6 oz (101.9 kg)  Height: 6' (1.829 m)    GEN- The patient is well appearing, alert and oriented x 3 today.   Head- normocephalic, atraumatic Eyes-  Sclera clear, conjunctiva pink Ears- hearing intact Oropharynx- clear Lungs-   normal work of breathing Heart- irregular rate and rhythm  GI- soft,  Extremities- no clubbing, cyanosis, + edema  Wt Readings  from Last 3 Encounters:  02/02/20 224 lb 9.6 oz (101.9 kg)  11/24/19 223 lb (101.2 kg)  08/16/19 223 lb (101.2 kg)    EKG tracing ordered today is personally reviewed and shows afib, RBBB  Assessment and Plan:  1. Paroxysmal atrial fibrillation chads2vasc score is 4.  He is on xarelto The patient has symptomatic, recurrent persistent atrial fibrillation. he has failed medical therapy with tikosyn. Therapeutic strategies for afib including medicine and repeat ablation were discussed in detail with the patient today. Risk, benefits, and alternatives to EP study and radiofrequency ablation for afib were also discussed in detail today. These risks include but are not limited to stroke, bleeding, vascular damage, tamponade, perforation, damage to the esophagus, lungs, and other structures, pulmonary vein stenosis, worsening renal function, and death. The patient understands these risk and wishes to discuss this with his wife.  He will contact my office if he decides to proceed.  If he decides to proceed, we will obtain cardiac CT prior to the procedure to exclude LAA thrombus and further evaluate atrial anatomy. We will obtain an echo at this time.  2. Nonischemic CM EF 45-50% in 2019 Repeat echo at this time   3. HTN Stable No change required today  3. OSA Compliance with CPAP advised  4. preop He is having periodontal surgery.  This is a low risk procedure. He has  no CV symptoms Ok to proceed.  Hold xarelto 24 hours prior and resume as soon as able following the procedure.  Risks, benefits and potential toxicities for medications prescribed and/or refilled reviewed with patient today.   He will contact my office if he decides to proceed with ablation.  Otherwise, I will see him in 3 months  [name] MD, [facility] 02/02/2020 12:04 PM

## 2020-02-02 NOTE — Telephone Encounter (Signed)
   Primary Cardiologist: [name], MD  Chart reviewed as part of pre-operative protocol coverage. As below, prior APP has recommended patient have OV to discuss pre-op clearance and has routed to MD. Patient sees Dr. Rayann Heman today for this. MD/nursing staff should route final decision to requesting provider per usual protocol. No separate input needed from pre-op APP team, will remove from preop box.  Charlie Pitter, PA-C 02/02/2020, 8:59 AM

## 2020-02-02 NOTE — Progress Notes (Signed)
PCP: Darreld Mclean, MD   Primary EP: Dr Nena Polio III is a 86 y.o. male who presents today for routine electrophysiology followup.  Since last being seen in our clinic, the patient reports doing very well.  He is now persistently in afib.  + palpitations and fatigue.   Today, he denies symptoms of palpitations, chest pain, shortness of breath,  lower extremity edema, dizziness, presyncope, or syncope.  The patient is otherwise without complaint today.   Past Medical History:  Diagnosis Date  . Benign localized prostatic hyperplasia with lower urinary tract symptoms (LUTS)    urologist-- dr Diona Fanti  . Chronic anticoagulation    Xarelto for afib  . Chronic constipation   . Chronic cystitis   . Gross hematuria   . History of DVT of lower extremity 06/08/2010   right femoral dvt dx post op total hip 05-08-2010  . History of gastric ulcer    remote  . History of kidney stones    one stone. remote hx  . Hyperlipidemia   . Hypertension    Dx by Dr. Lance Muss around age  49   . NICM (nonischemic cardiomyopathy) ([place])    per echo 06-11-2017,  ef 45-50% with diffuse hypokinesis,  G1DD  . OSA on CPAP 2017   compliant with CPAP.  managed by Dr Elsworth Soho.  . Osteoarthritis   . Other urethral stricture, male, meatal    chronic;  pt self dilates  . Paroxysmal atrial fibrillation [facility]) cardiologist-- dr Rayann Heman   first dx 2009/   a. documented by Dr Barrett Shell on office EKG 9/12. b. maintained on Tikosyn and Xarelto. c. s/p DCCV 's.  d.  x3  EP w/ ablation atrial fib last one 12-05-2016  . Prediabetes   . Premature ventricular contraction   . PVC's (premature ventricular contractions)   . RA (rheumatoid arthritis) [facility])    rheumatologist--- Dr Page Spiro,  treated with Humira  . RBBB (right bundle branch block)    Hx of a fib, ablation Aug 2018  . Rheumatoid arthritis ([place])   . Vitamin D deficiency   . Wears hearing aid in both ears    Past Surgical History:  Procedure  Laterality Date  . ABLATION OF DYSRHYTHMIC FOCUS  08/29/2015  . ATRIAL FIBRILLATION ABLATION  12/05/2016  . ATRIAL FIBRILLATION ABLATION N/A 12/05/2016   Procedure: Atrial Fibrillation Ablation;  Surgeon: [name], MD;  Location: [place] CV LAB;  Service: Cardiovascular;  Laterality: N/A;  . ATRIAL FLUTTER ABLATION  09/2010   by Greggory Brandy  . BUNIONECTOMY Right 1990s  . CARDIOVERSION N/A 10/28/2012   Procedure: CARDIOVERSION;  Surgeon: Burnell Blanks, MD;  Location: [place];  Service: Cardiovascular;  Laterality: N/A;  . CATARACT EXTRACTION W/ INTRAOCULAR LENS  IMPLANT, BILATERAL  2019  . CYSTOSCOPY WITH BIOPSY N/A 09/03/2018   Procedure: CYSTOSCOPY WITH FULGURATION PPIRJJOAC;  Surgeon: Franchot Gallo, MD;  [facility];  Service: Urology;  Laterality: N/A;  30 MINS  . ELECTROPHYSIOLOGIC STUDY N/A 08/29/2015   Procedure: Atrial Fibrillation Ablation;  Surgeon: [name], MD;  Location: [place] CV LAB;  Service: Cardiovascular;  Laterality: N/A;  . KNEE SURGERY Bilateral x3   1960s & 1970s   open  . LEFT HEART CATHETERIZATION WITH CORONARY ANGIOGRAM N/A 06/07/2011   Procedure: LEFT HEART CATHETERIZATION WITH CORONARY ANGIOGRAM;  Surgeon: Peter M Martinique, MD;  Location[facility] CATH LAB;  Service: Cardiovascular;  Laterality: N/A;    Normal coronary arteries,  low  normal LVF  . TONSILLECTOMY AND ADENOIDECTOMY  age 89  . TOTAL HIP ARTHROPLASTY Right 06/04/10    dr Noemi Chapel  @[facility]   . TOTAL KNEE ARTHROPLASTY Bilateral right 1995;  left 1997  . TRANSURETHRAL RESECTION OF PROSTATE  06-29-2002   dr humphries  @[facility]   . TRANSURETHRAL RESECTION OF PROSTATE N/A 07/06/2018   Procedure: TRANSURETHRAL RESECTION OF THE PROSTATE (TURP);  Surgeon: Franchot Gallo, MD;  Location: [facility];  Service: Urology;  Laterality: N/A;  9 MINS    ROS- all systems are reviewed and negatives except as per HPI above  Current Outpatient Medications  Medication Sig Dispense Refill  .  acetaminophen (TYLENOL) 500 MG tablet Take 1,000-1,500 mg by mouth 2 (two) times daily as needed for moderate pain.     Marland Kitchen atorvastatin (LIPITOR) 10 MG tablet Take 1 tablet (10 mg total) by mouth daily. 90 tablet 3  . Carboxymethylcellulose Sodium (THERATEARS OP) Apply 1 drop to eye daily as needed (dry eyes).    . Cholecalciferol (VITAMIN D3) 5000 UNITS TABS Take 5,000 Units by mouth See admin instructions. Take 5000 units by mouth every 4 days    . clindamycin (CLEOCIN T) 1 % external solution Apply 1 application topically daily as needed (for bumps in scalp). Apply to scalp  2  . Cranberry 1000 MG CAPS Take 3,000 mg by mouth every evening.     Mariane Baumgarten Calcium (STOOL SOFTENER PO) Take 4 tablets by mouth daily.    . finasteride (PROSCAR) 5 MG tablet Take 5 mg by mouth every morning.   3  . HUMIRA PEN 40 MG/0.4ML PNKT Inject 40 mg as directed every 7 (seven) days. Patient states that he is taking  6  . lisinopril (ZESTRIL) 10 MG tablet TAKE 1 TABLET BY MOUTH EVERY DAY 90 tablet 1  . Metoprolol Succinate 50 MG CS24 Take 50 mg by mouth. Whole tablet in evenng and half tablet in morning    . metoprolol tartrate (LOPRESSOR) 25 MG tablet TAKE 1 TABLET BY MOUTH EVERY 6 HOURS AS NEEDED FOR FAST HEART RATES 90 tablet 2  . Multiple Vitamins-Minerals (PRESERVISION AREDS 2) CAPS Take 1 capsule by mouth 2 (two) times a day.    . Omega-3 Fatty Acids (OMEGA 3 PO) Take 3 capsules by mouth every evening.     Marland Kitchen omeprazole (PRILOSEC) 20 MG capsule Take 20 mg by mouth daily as needed (acid reflux). Patient states that he takes the medication every 4 days    . solifenacin (VESICARE) 10 MG tablet Take 10 mg by mouth daily.    . tamsulosin (FLOMAX) 0.4 MG CAPS capsule Take 1 capsule (0.4 mg total) by mouth daily after supper. 30 capsule 5  . vitamin B-12 (CYANOCOBALAMIN) 500 MCG tablet Take 500 mcg by mouth every evening.     Alveda Reasons 20 MG TABS tablet TAKE 1 TABLET BY MOUTH EVERY DAY IN THE EVENING 90 tablet 1    No current facility-administered medications for this visit.    Physical Exam: Vitals:   02/02/20 1135  BP: 124/84  Pulse: 84  SpO2: 96%  Weight: 224 lb 9.6 oz (101.9 kg)  Height: 6' (1.829 m)    GEN- The patient is well appearing, alert and oriented x 3 today.   Head- normocephalic, atraumatic Eyes-  Sclera clear, conjunctiva pink Ears- hearing intact Oropharynx- clear Lungs-   normal work of breathing Heart- irregular rate and rhythm  GI- soft,  Extremities- no clubbing, cyanosis, + edema  Wt Readings  from Last 3 Encounters:  02/02/20 224 lb 9.6 oz (101.9 kg)  11/24/19 223 lb (101.2 kg)  08/16/19 223 lb (101.2 kg)    EKG tracing ordered today is personally reviewed and shows afib, RBBB  Assessment and Plan:  1. Paroxysmal atrial fibrillation chads2vasc score is 4.  He is on xarelto The patient has symptomatic, recurrent persistent atrial fibrillation. he has failed medical therapy with tikosyn. Therapeutic strategies for afib including medicine and repeat ablation were discussed in detail with the patient today. Risk, benefits, and alternatives to EP study and radiofrequency ablation for afib were also discussed in detail today. These risks include but are not limited to stroke, bleeding, vascular damage, tamponade, perforation, damage to the esophagus, lungs, and other structures, pulmonary vein stenosis, worsening renal function, and death. The patient understands these risk and wishes to discuss this with his wife.  He will contact my office if he decides to proceed.  If he decides to proceed, we will obtain cardiac CT prior to the procedure to exclude LAA thrombus and further evaluate atrial anatomy. We will obtain an echo at this time.  2. Nonischemic CM EF 45-50% in 2019 Repeat echo at this time   3. HTN Stable No change required today  3. OSA Compliance with CPAP advised  4. preop He is having periodontal surgery.  This is a low risk procedure. He has  no CV symptoms Ok to proceed.  Hold xarelto 24 hours prior and resume as soon as able following the procedure.  Risks, benefits and potential toxicities for medications prescribed and/or refilled reviewed with patient today.   He will contact my office if he decides to proceed with ablation.  Otherwise, I will see him in 3 months  [name] MD, [facility] 02/02/2020 12:04 PM

## 2020-02-03 ENCOUNTER — Other Ambulatory Visit: Payer: Self-pay | Admitting: *Deleted

## 2020-02-03 DIAGNOSIS — I4891 Unspecified atrial fibrillation: Secondary | ICD-10-CM

## 2020-02-03 NOTE — Telephone Encounter (Signed)
Ablation scheduled along with lab work and Covid screening. Will send appointments and instructions via mychart.  Advised the patient if he has any questions to please call the office or mychart and I would be happy to help.   Patient verbalized understand.

## 2020-02-07 ENCOUNTER — Other Ambulatory Visit: Payer: Self-pay

## 2020-02-07 ENCOUNTER — Other Ambulatory Visit: Payer: Medicare PPO | Admitting: *Deleted

## 2020-02-07 ENCOUNTER — Other Ambulatory Visit (HOSPITAL_COMMUNITY): Payer: Medicare PPO

## 2020-02-07 DIAGNOSIS — I4891 Unspecified atrial fibrillation: Secondary | ICD-10-CM | POA: Diagnosis not present

## 2020-02-07 LAB — BASIC METABOLIC PANEL
BUN/Creatinine Ratio: 16 (ref 10–24)
BUN: 12 mg/dL (ref 8–27)
CO2: 27 mmol/L (ref 20–29)
Calcium: 8.7 mg/dL (ref 8.6–10.2)
Chloride: 105 mmol/L (ref 96–106)
Creatinine, Ser: 0.75 mg/dL — ABNORMAL LOW (ref 0.76–1.27)
GFR calc Af Amer: 98 mL/min/{1.73_m2} (ref >59)
GFR calc non Af Amer: 85 mL/min/{1.73_m2} (ref >59)
Glucose: 103 mg/dL — ABNORMAL HIGH (ref 65–99)
Potassium: 3.8 mmol/L (ref 3.5–5.2)
Sodium: 145 mmol/L — ABNORMAL HIGH (ref 134–144)

## 2020-02-07 LAB — CBC WITH DIFFERENTIAL/PLATELET
Basophils Absolute: 0 10*3/uL (ref 0.0–0.2)
Basos: 1 %
EOS (ABSOLUTE): 0.2 10*3/uL (ref 0.0–0.4)
Eos: 4 %
Hematocrit: 41.3 % (ref 37.5–51.0)
Hemoglobin: 14.3 g/dL (ref 13.0–17.7)
Immature Grans (Abs): 0 10*3/uL (ref 0.0–0.1)
Immature Granulocytes: 0 %
Lymphocytes Absolute: 1.2 10*3/uL (ref 0.7–3.1)
Lymphs: 27 %
MCH: 35 pg — ABNORMAL HIGH (ref 26.6–33.0)
MCHC: 34.6 g/dL (ref 31.5–35.7)
MCV: 101 fL — ABNORMAL HIGH (ref 79–97)
Monocytes Absolute: 0.7 10*3/uL (ref 0.1–0.9)
Monocytes: 16 %
Neutrophils Absolute: 2.3 10*3/uL (ref 1.4–7.0)
Neutrophils: 52 %
Platelets: 144 10*3/uL — ABNORMAL LOW (ref 150–450)
RBC: 4.09 x10E6/uL — ABNORMAL LOW (ref 4.14–5.80)
RDW: 12.3 % (ref 11.6–15.4)
WBC: 4.3 10*3/uL (ref 3.4–10.8)

## 2020-02-09 DIAGNOSIS — I4819 Other persistent atrial fibrillation: Secondary | ICD-10-CM | POA: Diagnosis not present

## 2020-02-14 ENCOUNTER — Telehealth: Payer: Self-pay | Admitting: *Deleted

## 2020-02-14 ENCOUNTER — Encounter: Payer: Self-pay | Admitting: *Deleted

## 2020-02-14 NOTE — Telephone Encounter (Signed)
Called office and pt is going to have only 1 tooth pulled and they will use local can use either with/without EPI which ever is appropriate for the pt. Please let them know if EPI should NOT be used.

## 2020-02-14 NOTE — Telephone Encounter (Signed)
° °  [facility] Pre-operative Risk Assessment    HEARTCARE STAFF: - Please ensure there is not already an duplicate clearance open for this procedure. - Under Visit Info/Reason for Call, type in Other and utilize the format Clearance MM/DD/YY or Clearance TBD. Do not use dashes or single digits. - If request is for dental extraction, please clarify the # of teeth to be extracted.  Request for surgical clearance:  1. What type of surgery is being performed?  PERIODONTAL SURGERY   2. When is this surgery scheduled?  TBD   3. What type of clearance is required (medical clearance vs. Pharmacy clearance to hold med vs. Both)?  BOTH  4. Are there any medications that need to be held prior to surgery and how long? [place]   5. Practice name and name of physician performing surgery?  [facility] /    6. What is the office phone number?  [phone]   7.   What is the office fax number?  [phone]  8.   Anesthesia type (None, local, MAC, general) ?    Jeanann Lewandowsky 02/14/2020, 1:13 PM  _________________________________________________________________   (provider comments below)

## 2020-02-15 NOTE — Telephone Encounter (Signed)
   Primary Cardiologist: [name], MD  Chart reviewed as part of pre-operative protocol coverage. Simple dental extractions are considered low risk procedures per guidelines and generally do not require any specific cardiac clearance. It is also generally accepted that for simple extractions and dental cleanings, there is no need to interrupt blood thinner therapy.   SBE prophylaxis is not required for the patient.  Please do not use epinephrine unless you feel it is necessary for the procedure.  I will route this recommendation to the requesting party via Epic fax function and remove from pre-op pool.  Please call with questions.  Deberah Pelton, NP 02/15/2020, 2:01 PM

## 2020-02-22 ENCOUNTER — Telehealth (HOSPITAL_COMMUNITY): Payer: Self-pay | Admitting: Emergency Medicine

## 2020-02-22 NOTE — Telephone Encounter (Signed)
Attempted to call patient regarding upcoming cardiac CT appointment. °Left message on voicemail with name and callback number °Malka Bocek RN Navigator Cardiac Imaging °[facility] °[phone] Office °[phone] Cell ° °

## 2020-02-23 ENCOUNTER — Telehealth (HOSPITAL_COMMUNITY): Payer: Self-pay | Admitting: Emergency Medicine

## 2020-02-23 NOTE — Telephone Encounter (Signed)
Reaching out to patient to offer assistance regarding upcoming cardiac imaging study; pt verbalizes understanding of appt date/time, parking situation and where to check in, pre-test NPO status and medications ordered, and verified current allergies; name and call back number provided for further questions should they arise Marchia Bond RN Navigator Cardiac Imaging [facility] [phone] office [phone] cell  Pt instructed to check HR in AM at 6am (ideally when he would take breakthru metoprolol) and if HR is >55 bpm he would take the breakthru dose of metoprolol. Pt verbalized understanding Clarise Cruz

## 2020-02-24 ENCOUNTER — Telehealth: Payer: Self-pay | Admitting: Internal Medicine

## 2020-02-24 ENCOUNTER — Ambulatory Visit (HOSPITAL_COMMUNITY)
Admission: RE | Admit: 2020-02-24 | Discharge: 2020-02-24 | Disposition: A | Discharge status: Home / Self Care | Payer: Medicare PPO | Source: Ambulatory Visit | Attending: Internal Medicine | Admitting: Internal Medicine

## 2020-02-24 ENCOUNTER — Other Ambulatory Visit: Payer: Self-pay

## 2020-02-24 DIAGNOSIS — I4891 Unspecified atrial fibrillation: Secondary | ICD-10-CM | POA: Diagnosis not present

## 2020-02-24 MED ORDER — IOHEXOL 350 MG/ML SOLN
80.0000 mL | Freq: Once | INTRAVENOUS | Status: AC | PRN
  Administered 2020-02-24: 80 mL via INTRAVENOUS

## 2020-02-24 MED ORDER — METOPROLOL TARTRATE 5 MG/5ML IV SOLN: 5.0000 mg | INTRAVENOUS | Status: DC | PRN

## 2020-02-24 MED ORDER — METOPROLOL TARTRATE 5 MG/5ML IV SOLN
INTRAVENOUS | Status: AC
  Administered 2020-02-24: 5 mg via INTRAVENOUS
  Filled 2020-02-24: qty 5

## 2020-02-24 NOTE — Telephone Encounter (Signed)
[name] is calling stating  Connor [name] came for his CT today and stated he has not received an information from our office in regards to his upcoming ablation. She states he is not even sure when he is supposed to arrive. Please advise.

## 2020-02-24 NOTE — Telephone Encounter (Signed)
Spoke to the patient about assisting him in locating the instruction letter for his upcoming procedure in mychart. He verbalized understanding but is driving, will call him back around 1:30 pm to make sure he finds and understands the letter.

## 2020-02-24 NOTE — Telephone Encounter (Signed)
Patient able to locate letter in [place], all questions were answered and patient verbalized understanding.

## 2020-02-25 ENCOUNTER — Ambulatory Visit (HOSPITAL_COMMUNITY): Payer: Medicare PPO | Attending: Cardiology

## 2020-02-25 DIAGNOSIS — I480 Paroxysmal atrial fibrillation: Secondary | ICD-10-CM

## 2020-02-25 DIAGNOSIS — I428 Other cardiomyopathies: Secondary | ICD-10-CM | POA: Diagnosis not present

## 2020-02-25 DIAGNOSIS — I10 Essential (primary) hypertension: Secondary | ICD-10-CM | POA: Diagnosis not present

## 2020-02-25 DIAGNOSIS — G4733 Obstructive sleep apnea (adult) (pediatric): Secondary | ICD-10-CM

## 2020-02-25 LAB — ECHOCARDIOGRAM COMPLETE: S' Lateral: 4.3 cm

## 2020-02-28 ENCOUNTER — Other Ambulatory Visit (HOSPITAL_COMMUNITY)
Admission: RE | Admit: 2020-02-28 | Discharge: 2020-02-28 | Disposition: A | Discharge status: Home / Self Care | Payer: Medicare PPO | Source: Ambulatory Visit | Attending: Internal Medicine | Admitting: Internal Medicine

## 2020-02-28 DIAGNOSIS — Z20822 Contact with and (suspected) exposure to covid-19: Secondary | ICD-10-CM | POA: Diagnosis not present

## 2020-02-28 DIAGNOSIS — Z01812 Encounter for preprocedural laboratory examination: Secondary | ICD-10-CM | POA: Insufficient documentation

## 2020-02-28 LAB — SARS CORONAVIRUS 2 (TAT 6-24 HRS): SARS Coronavirus 2: NEGATIVE

## 2020-02-28 NOTE — Progress Notes (Signed)
Instructed patient on the following items: Arrival time 0530 Nothing to eat or drink after midnight No meds AM of procedure Responsible person to drive you home and stay with you for 24 hrs  Have you missed any doses of anti-coagulant Xarelto- no doses missed, will take dose tonight

## 2020-02-29 ENCOUNTER — Ambulatory Visit (HOSPITAL_COMMUNITY): Payer: Medicare PPO | Admitting: Certified Registered Nurse Anesthetist

## 2020-02-29 ENCOUNTER — Ambulatory Visit (HOSPITAL_COMMUNITY)
Admission: RE | Admit: 2020-02-29 | Discharge: 2020-02-29 | Disposition: A | Discharge status: Home / Self Care | Payer: Medicare PPO | Attending: Internal Medicine | Admitting: Internal Medicine

## 2020-02-29 ENCOUNTER — Encounter (HOSPITAL_COMMUNITY)
Admission: RE | Disposition: A | Discharge status: Home / Self Care | Payer: Self-pay | Source: Home / Self Care | Attending: Internal Medicine

## 2020-02-29 DIAGNOSIS — I10 Essential (primary) hypertension: Secondary | ICD-10-CM | POA: Diagnosis not present

## 2020-02-29 DIAGNOSIS — Z96653 Presence of artificial knee joint, bilateral: Secondary | ICD-10-CM | POA: Insufficient documentation

## 2020-02-29 DIAGNOSIS — I428 Other cardiomyopathies: Secondary | ICD-10-CM | POA: Insufficient documentation

## 2020-02-29 DIAGNOSIS — G4733 Obstructive sleep apnea (adult) (pediatric): Secondary | ICD-10-CM | POA: Diagnosis not present

## 2020-02-29 DIAGNOSIS — Z7901 Long term (current) use of anticoagulants: Secondary | ICD-10-CM | POA: Diagnosis not present

## 2020-02-29 DIAGNOSIS — I119 Hypertensive heart disease without heart failure: Secondary | ICD-10-CM | POA: Diagnosis not present

## 2020-02-29 DIAGNOSIS — I4510 Unspecified right bundle-branch block: Secondary | ICD-10-CM | POA: Diagnosis not present

## 2020-02-29 DIAGNOSIS — E785 Hyperlipidemia, unspecified: Secondary | ICD-10-CM | POA: Insufficient documentation

## 2020-02-29 DIAGNOSIS — N401 Enlarged prostate with lower urinary tract symptoms: Secondary | ICD-10-CM | POA: Insufficient documentation

## 2020-02-29 DIAGNOSIS — N35811 Other urethral stricture, male, meatal: Secondary | ICD-10-CM | POA: Insufficient documentation

## 2020-02-29 DIAGNOSIS — Z96641 Presence of right artificial hip joint: Secondary | ICD-10-CM | POA: Insufficient documentation

## 2020-02-29 DIAGNOSIS — I4819 Other persistent atrial fibrillation: Secondary | ICD-10-CM | POA: Diagnosis not present

## 2020-02-29 DIAGNOSIS — Z79899 Other long term (current) drug therapy: Secondary | ICD-10-CM | POA: Insufficient documentation

## 2020-02-29 DIAGNOSIS — Z86718 Personal history of other venous thrombosis and embolism: Secondary | ICD-10-CM | POA: Insufficient documentation

## 2020-02-29 DIAGNOSIS — M1990 Unspecified osteoarthritis, unspecified site: Secondary | ICD-10-CM | POA: Diagnosis not present

## 2020-02-29 DIAGNOSIS — R7303 Prediabetes: Secondary | ICD-10-CM | POA: Diagnosis not present

## 2020-02-29 DIAGNOSIS — R310 Gross hematuria: Secondary | ICD-10-CM | POA: Diagnosis not present

## 2020-02-29 DIAGNOSIS — M069 Rheumatoid arthritis, unspecified: Secondary | ICD-10-CM | POA: Insufficient documentation

## 2020-02-29 DIAGNOSIS — I480 Paroxysmal atrial fibrillation: Secondary | ICD-10-CM | POA: Diagnosis not present

## 2020-02-29 HISTORY — PX: ATRIAL FIBRILLATION ABLATION: EP1191

## 2020-02-29 SURGERY — ATRIAL FIBRILLATION ABLATION
Anesthesia: General

## 2020-02-29 MED ORDER — OXYCODONE HCL 5 MG PO TABS: 5.0000 mg | ORAL_TABLET | Freq: Once | ORAL | Status: DC | PRN

## 2020-02-29 MED ORDER — SUGAMMADEX SODIUM 200 MG/2ML IV SOLN
INTRAVENOUS | Status: DC | PRN
  Administered 2020-02-29: 100 mg via INTRAVENOUS
  Administered 2020-02-29: 200 mg via INTRAVENOUS

## 2020-02-29 MED ORDER — ROCURONIUM BROMIDE 100 MG/10ML IV SOLN
INTRAVENOUS | Status: DC | PRN
  Administered 2020-02-29: 20 mg via INTRAVENOUS
  Administered 2020-02-29: 40 mg via INTRAVENOUS

## 2020-02-29 MED ORDER — PHENYLEPHRINE HCL-NACL 10-0.9 MG/250ML-% IV SOLN
INTRAVENOUS | Status: DC | PRN
  Administered 2020-02-29: 50 ug/min via INTRAVENOUS

## 2020-02-29 MED ORDER — HEPARIN SODIUM (PORCINE) 1000 UNIT/ML IJ SOLN
INTRAMUSCULAR | Status: DC | PRN
  Administered 2020-02-29: 1000 [IU] via INTRAVENOUS
  Administered 2020-02-29: 14000 [IU] via INTRAVENOUS

## 2020-02-29 MED ORDER — SODIUM CHLORIDE 0.9 % IV SOLN: 250.0000 mL | INTRAVENOUS | Status: DC | PRN

## 2020-02-29 MED ORDER — ACETAMINOPHEN 325 MG PO TABS
650.0000 mg | ORAL_TABLET | ORAL | Status: DC | PRN
  Filled 2020-02-29: qty 2

## 2020-02-29 MED ORDER — HEPARIN SODIUM (PORCINE) 1000 UNIT/ML IJ SOLN
INTRAMUSCULAR | Status: AC
  Filled 2020-02-29: qty 1

## 2020-02-29 MED ORDER — PROPOFOL 10 MG/ML IV BOLUS
INTRAVENOUS | Status: DC | PRN
  Administered 2020-02-29: 20 mg via INTRAVENOUS
  Administered 2020-02-29: 150 mg via INTRAVENOUS

## 2020-02-29 MED ORDER — OXYCODONE HCL 5 MG/5ML PO SOLN: 5.0000 mg | Freq: Once | ORAL | Status: DC | PRN

## 2020-02-29 MED ORDER — PROTAMINE SULFATE 10 MG/ML IV SOLN
INTRAVENOUS | Status: DC | PRN
  Administered 2020-02-29: 40 mg via INTRAVENOUS

## 2020-02-29 MED ORDER — SODIUM CHLORIDE 0.9% FLUSH: 3.0000 mL | INTRAVENOUS | Status: DC | PRN

## 2020-02-29 MED ORDER — HYDROMORPHONE HCL 1 MG/ML IJ SOLN: 0.2500 mg | INTRAMUSCULAR | Status: DC | PRN

## 2020-02-29 MED ORDER — HEPARIN SODIUM (PORCINE) 1000 UNIT/ML IJ SOLN
INTRAMUSCULAR | Status: DC | PRN
  Administered 2020-02-29: 1000 [IU] via INTRAVENOUS
  Administered 2020-02-29: 3000 [IU] via INTRAVENOUS

## 2020-02-29 MED ORDER — ONDANSETRON HCL 4 MG/2ML IJ SOLN: 4.0000 mg | Freq: Once | INTRAMUSCULAR | Status: DC | PRN

## 2020-02-29 MED ORDER — ONDANSETRON HCL 4 MG/2ML IJ SOLN: 4.0000 mg | Freq: Four times a day (QID) | INTRAMUSCULAR | Status: DC | PRN

## 2020-02-29 MED ORDER — HEPARIN (PORCINE) IN NACL 1000-0.9 UT/500ML-% IV SOLN
INTRAVENOUS | Status: DC | PRN
  Administered 2020-02-29: 500 mL

## 2020-02-29 MED ORDER — LIDOCAINE 2% (20 MG/ML) 5 ML SYRINGE
INTRAMUSCULAR | Status: DC | PRN
  Administered 2020-02-29: 60 mg via INTRAVENOUS

## 2020-02-29 MED ORDER — PHENYLEPHRINE 40 MCG/ML (10ML) SYRINGE FOR IV PUSH (FOR BLOOD PRESSURE SUPPORT)
PREFILLED_SYRINGE | INTRAVENOUS | Status: DC | PRN
  Administered 2020-02-29 (×2): 80 ug via INTRAVENOUS
  Administered 2020-02-29 (×3): 40 ug via INTRAVENOUS
  Administered 2020-02-29 (×3): 80 ug via INTRAVENOUS

## 2020-02-29 MED ORDER — ONDANSETRON HCL 4 MG/2ML IJ SOLN
INTRAMUSCULAR | Status: DC | PRN
  Administered 2020-02-29: 4 mg via INTRAVENOUS

## 2020-02-29 MED ORDER — HYDROCODONE-ACETAMINOPHEN 5-325 MG PO TABS: 1.0000 | ORAL_TABLET | ORAL | Status: DC | PRN

## 2020-02-29 MED ORDER — DEXAMETHASONE SODIUM PHOSPHATE 10 MG/ML IJ SOLN
INTRAMUSCULAR | Status: DC | PRN
  Administered 2020-02-29: 5 mg via INTRAVENOUS

## 2020-02-29 MED ORDER — SODIUM CHLORIDE 0.9% FLUSH: 3.0000 mL | Freq: Two times a day (BID) | INTRAVENOUS | Status: DC

## 2020-02-29 MED ORDER — FENTANYL CITRATE (PF) 250 MCG/5ML IJ SOLN
INTRAMUSCULAR | Status: DC | PRN
Start: 2020-02-29 — End: 2020-02-29
  Administered 2020-02-29 (×2): 50 ug via INTRAVENOUS

## 2020-02-29 MED ORDER — SODIUM CHLORIDE 0.9 % IV SOLN: INTRAVENOUS | Status: DC

## 2020-02-29 SURGICAL SUPPLY — 19 items
BLANKET WARM UNDERBOD FULL ACC (MISCELLANEOUS) ×3 IMPLANT
CATH 8FR REPROCESSED SOUNDSTAR (CATHETERS) ×3 IMPLANT
CATH MAPPNG PENTARAY F 2-6-2MM (CATHETERS) ×1 IMPLANT
CATH SMTCH THERMOCOOL SF DF (CATHETERS) ×3 IMPLANT
CATH WEBSTER BI DIR CS D-F CRV (CATHETERS) ×3 IMPLANT
CLOSURE PERCLOSE PROSTYLE (VASCULAR PRODUCTS) ×9 IMPLANT
COVER SWIFTLINK CONNECTOR (BAG) ×3 IMPLANT
MAT PREVALON FULL STRYKER (MISCELLANEOUS) ×3 IMPLANT
NEEDLE BAYLIS TRANSSEPTAL 71CM (NEEDLE) ×3 IMPLANT
PACK EP LATEX FREE (CUSTOM PROCEDURE TRAY) ×3
PACK EP LF (CUSTOM PROCEDURE TRAY) ×1 IMPLANT
PAD PRO RADIOLUCENT 2001M-C (PAD) ×3 IMPLANT
PATCH CARTO3 (PAD) ×3 IMPLANT
PENTARAY F 2-6-2MM (CATHETERS) ×3
SHEATH PINNACLE 7F 10CM (SHEATH) ×6 IMPLANT
SHEATH PINNACLE 9F 10CM (SHEATH) ×3 IMPLANT
SHEATH PROBE COVER 6X72 (BAG) ×3 IMPLANT
SHEATH SWARTZ TS SL2 63CM 8.5F (SHEATH) ×3 IMPLANT
TUBING SMART ABLATE COOLFLOW (TUBING) ×3 IMPLANT

## 2020-02-29 NOTE — Anesthesia Procedure Notes (Signed)
Procedure Name: Intubation Date/Time: 02/29/2020 7:47 AM Performed by: Janene Harvey, CRNA Pre-anesthesia Checklist: Patient identified, Emergency Drugs available, Suction available and Patient being monitored Patient Re-evaluated:Patient Re-evaluated prior to induction Oxygen Delivery Method: Circle system utilized Preoxygenation: Pre-oxygenation with 100% oxygen Induction Type: IV induction Ventilation: Mask ventilation without difficulty Laryngoscope Size: Mac and 4 Grade View: Grade I Tube type: Oral Tube size: 7.5 mm Number of attempts: 1 Airway Equipment and Method: Stylet and Oral airway Placement Confirmation: ETT inserted through vocal cords under direct vision,  positive ETCO2 and breath sounds checked- equal and bilateral Secured at: 22 cm Tube secured with: Tape Dental Injury: Teeth and Oropharynx as per pre-operative assessment

## 2020-02-29 NOTE — Anesthesia Postprocedure Evaluation (Signed)
Anesthesia Post Note  Patient: Connor [name]  Procedure(s) Performed: ATRIAL FIBRILLATION ABLATION (N/A )     Patient location during evaluation: PACU Anesthesia Type: General Level of consciousness: awake and alert Pain management: pain level controlled Vital Signs Assessment: post-procedure vital signs reviewed and stable Respiratory status: spontaneous breathing, nonlabored ventilation, respiratory function stable and patient connected to nasal cannula oxygen Cardiovascular status: blood pressure returned to baseline and stable Postop Assessment: no apparent nausea or vomiting Anesthetic complications: no   No complications documented.  Last Vitals:  Vitals:   02/29/20 1400 02/29/20 1415  BP: (!) 155/91   Pulse: 85 76  Resp: (!) 21 17  Temp:    SpO2: 95% 96%    Last Pain:  Vitals:   02/29/20 1047  TempSrc:   PainSc: 0-No pain                 Belenda Cruise P Ritaj Dullea

## 2020-02-29 NOTE — Discharge Instructions (Signed)
Post procedure care instructions No driving for 4 days. No lifting over 5 lbs for 1 week. No vigorous or sexual activity for 1 week. You may return to work/your usual activities on 03/07/2020. Keep procedure site clean & dry. If you notice increased pain, swelling, bleeding or pus, call/return!  You may shower, but no soaking baths/hot tubs/pools for 1 week.    You have an appointment set up with the [facility].  Multiple studies have shown that being followed by a dedicated atrial fibrillation clinic in addition to the standard care you receive from your other physicians improves health. We believe that enrollment in the atrial fibrillation clinic will allow [place] to better care for you.   The phone number to the [facility] is [phone]. The clinic is staffed Monday through Friday from 8:30amCardiac Ablation, Care After  This sheet gives you information about how to care for yourself after your procedure. Your health care provider may also give you more specific instructions. If you have problems or questions, contact your health care provider. What can I expect after the procedure? After the procedure, it is common to have:  Bruising around your puncture site.  Tenderness around your puncture site.  Skipped heartbeats.  Tiredness (fatigue).  Follow these instructions at home: Puncture site care   Follow instructions from your health care provider about how to take care of your puncture site. Make sure you: ? If present, leave stitches (sutures), skin glue, or adhesive strips in place. These skin closures may need to stay in place for up to 2 weeks. If adhesive strip edges start to loosen and curl up, you may trim the loose edges. Do not remove adhesive strips completely unless your health care provider tells you to do that. ? If a large square bandage is present, this may be removed 24 hours after surgery.   Check your puncture site every day for signs of  infection. Check for: ? Redness, swelling, or pain. ? Fluid or blood. If your puncture site starts to bleed, lie down on your back, apply firm pressure to the area, and contact your health care provider. ? Warmth. ? Pus or a bad smell. Driving  Do not drive for at least 4 days after your procedure or however long your health care provider recommends. (Do not resume driving if you have previously been instructed not to drive for other health reasons.)  Do not drive or use heavy machinery while taking prescription pain medicine. Activity  Avoid activities that take a lot of effort for at least 7 days after your procedure.  Do not lift anything that is heavier than 5 lb (4.5 kg) for one week.   No sexual activity for 1 week.   Return to your normal activities as told by your health care provider. Ask your health care provider what activities are safe for you. General instructions  Take over-the-counter and prescription medicines only as told by your health care provider.  Do not use any products that contain nicotine or tobacco, such as cigarettes and e-cigarettes. If you need help quitting, ask your health care provider.  You may shower after 24 hours, but Do not take baths, swim, or use a hot tub for 1 week.   Do not drink alcohol for 24 hours after your procedure.  Keep all follow-up visits as told by your health care provider. This is important. Contact a health care provider if:  You have redness, mild swelling, or pain around your  puncture site.  You have fluid or blood coming from your puncture site that stops after applying firm pressure to the area.  Your puncture site feels warm to the touch.  You have pus or a bad smell coming from your puncture site.  You have a fever.  You have chest pain or discomfort that spreads to your neck, jaw, or arm.  You are sweating a lot.  You feel nauseous.  You have a fast or irregular heartbeat.  You have shortness of  breath.  You are dizzy or light-headed and feel the need to lie down.  You have pain or numbness in the arm or leg closest to your puncture site. Get help right away if:  Your puncture site suddenly swells.  Your puncture site is bleeding and the bleeding does not stop after applying firm pressure to the area. These symptoms may represent a serious problem that is an emergency. Do not wait to see if the symptoms will go away. Get medical help right away. Call your local emergency services (911 in the [place] Do not drive yourself to the hospital. Summary  After the procedure, it is normal to have bruising and tenderness at the puncture site in your groin, neck, or forearm.  Check your puncture site every day for signs of infection.  Get help right away if your puncture site is bleeding and the bleeding does not stop after applying firm pressure to the area. This is a medical emergency. This information is not intended to replace advice given to you by your health care provider. Make sure you discuss any questions you have with your health care provider.   to 5pm.  Parking Directions: The clinic is located in the Heart and Vascular Building connected to [facility]. 1)From [address] turn on to [address] and go to the 3rd entrance  (Heart and Vascular entrance) on the right. 2)Look to the right for Heart &Vascular Parking Garage. 3)A code for the entrance is required, for November is 3008.   4)Take the elevators to the 1st floor. Registration is in the room with the glass walls at the end of the hallway.  If you have any trouble parking or locating the clinic, please don't hesitate to call [phone].Cardiac Ablation, Care After  This sheet gives you information about how to care for yourself after your procedure. Your health care provider may also give you more specific instructions. If you have problems or questions, contact your health care provider. What can I  expect after the procedure? After the procedure, it is common to have:  Bruising around your puncture site.  Tenderness around your puncture site.  Skipped heartbeats.  Tiredness (fatigue).  Follow these instructions at home: Puncture site care   Follow instructions from your health care provider about how to take care of your puncture site. Make sure you: ? If present, leave stitches (sutures), skin glue, or adhesive strips in place. These skin closures may need to stay in place for up to 2 weeks. If adhesive strip edges start to loosen and curl up, you may trim the loose edges. Do not remove adhesive strips completely unless your health care provider tells you to do that. ? If a large square bandage is present, this may be removed 24 hours after surgery.   Check your puncture site every day for signs of infection. Check for: ? Redness, swelling, or pain. ? Fluid or blood. If your puncture site starts to bleed, lie down on  your back, apply firm pressure to the area, and contact your health care provider. ? Warmth. ? Pus or a bad smell. Driving  Do not drive for at least 4 days after your procedure or however long your health care provider recommends. (Do not resume driving if you have previously been instructed not to drive for other health reasons.)  Do not drive or use heavy machinery while taking prescription pain medicine. Activity  Avoid activities that take a lot of effort for at least 7 days after your procedure.  Do not lift anything that is heavier than 5 lb (4.5 kg) for one week.   No sexual activity for 1 week.   Return to your normal activities as told by your health care provider. Ask your health care provider what activities are safe for you. General instructions  Take over-the-counter and prescription medicines only as told by your health care provider.  Do not use any products that contain nicotine or tobacco, such as cigarettes and e-cigarettes. If you need  help quitting, ask your health care provider.  You may shower after 24 hours, but Do not take baths, swim, or use a hot tub for 1 week.   Do not drink alcohol for 24 hours after your procedure.  Keep all follow-up visits as told by your health care provider. This is important. Contact a health care provider if:  You have redness, mild swelling, or pain around your puncture site.  You have fluid or blood coming from your puncture site that stops after applying firm pressure to the area.  Your puncture site feels warm to the touch.  You have pus or a bad smell coming from your puncture site.  You have a fever.  You have chest pain or discomfort that spreads to your neck, jaw, or arm.  You are sweating a lot.  You feel nauseous.  You have a fast or irregular heartbeat.  You have shortness of breath.  You are dizzy or light-headed and feel the need to lie down.  You have pain or numbness in the arm or leg closest to your puncture site. Get help right away if:  Your puncture site suddenly swells.  Your puncture site is bleeding and the bleeding does not stop after applying firm pressure to the area. These symptoms may represent a serious problem that is an emergency. Do not wait to see if the symptoms will go away. Get medical help right away. Call your local emergency services (911 in the [place] Do not drive yourself to the hospital. Summary  After the procedure, it is normal to have bruising and tenderness at the puncture site in your groin, neck, or forearm.  Check your puncture site every day for signs of infection.  Get help right away if your puncture site is bleeding and the bleeding does not stop after applying firm pressure to the area. This is a medical emergency. This information is not intended to replace advice given to you by your health care provider. Make sure you discuss any questions you have with your health care provider.

## 2020-02-29 NOTE — Anesthesia Preprocedure Evaluation (Addendum)
Anesthesia Evaluation  Patient identified by MRN, date of birth, ID band Patient awake    Reviewed: Patient's Chart, lab work & pertinent test results  Airway Mallampati: II  TM Distance: >3 FB Neck ROM: Full    Dental  (+) Teeth Intact   Pulmonary sleep apnea and Continuous Positive Airway Pressure Ventilation , former smoker,    Pulmonary exam normal        Cardiovascular hypertension, Pt. on medications and Pt. on home beta blockers + DVT  + dysrhythmias Atrial Fibrillation  Rhythm:Irregular Rate:Normal     Neuro/Psych negative neurological ROS  negative psych ROS   GI/Hepatic Neg liver ROS, GERD  ,  Endo/Other  Pre-diabetic  Renal/GU    History of renal stones    Musculoskeletal  (+) Arthritis , Rheumatoid disorders,    Abdominal (+)  Abdomen: soft. Bowel sounds: normal.  Peds  Hematology negative hematology ROS (+)   Anesthesia Other Findings   Reproductive/Obstetrics                            Anesthesia Physical Anesthesia Plan  ASA: III  Anesthesia Plan: General   Post-op Pain Management:    Induction: Intravenous  PONV Risk Score and Plan: 2 and Ondansetron and Dexamethasone  Airway Management Planned: Mask and Oral ETT  Additional Equipment: None  Intra-op Plan:   Post-operative Plan: Extubation in OR  Informed Consent: I have reviewed the patients History and Physical, chart, labs and discussed the procedure including the risks, benefits and alternatives for the proposed anesthesia with the patient or authorized representative who has indicated his/her understanding and acceptance.     Dental advisory given  Plan Discussed with: CRNA  Anesthesia Plan Comments: (Lab Results      Component                Value               Date                      WBC                      4.3                 02/07/2020                HGB                      14.3                 02/07/2020                HCT                      41.3                02/07/2020                MCV                      101 (H)             02/07/2020                PLT  144 (L)             02/07/2020            ECHO 02/25/20: 1. Left ventricular ejection fraction, by estimation, is 50 to 55%. The  left ventricle has low normal function. The left ventricle has no regional  wall motion abnormalities. The left ventricular internal cavity size was  mildly dilated. Left ventricular  diastolic parameters are consistent with Grade I diastolic dysfunction  (impaired relaxation).  2. Right ventricular systolic function is normal. The right ventricular  size is mildly enlarged. There is mildly elevated pulmonary artery  systolic pressure. The estimated right ventricular systolic pressure is  28.9 mmHg.  3. Left atrial size was mildly dilated.  4. Right atrial size was mildly dilated.  5. The mitral valve is normal in structure. Trivial mitral valve  regurgitation. No evidence of mitral stenosis.  6. The aortic valve is normal in structure. Aortic valve regurgitation is  not visualized. No aortic stenosis is present.  7. Aortic dilatation noted. There is mild dilatation at the level of the  sinuses of Valsalva, measuring 38 mm.  8. The inferior vena cava is normal in size with greater than 50%  respiratory variability, suggesting right atrial pressure of 3 mmHg.  Comparison(s): Prior images reviewed side by side. The left ventricular  function has improved. 06/11/17 EF 45-50%. )       Anesthesia Quick Evaluation

## 2020-02-29 NOTE — Transfer of Care (Signed)
Immediate Anesthesia Transfer of Care Note  Patient: [name]  Procedure(s) Performed: ATRIAL FIBRILLATION ABLATION (N/A )  Patient Location: Cath Lab  Anesthesia Type:General  Level of Consciousness: drowsy and patient cooperative  Airway & Oxygen Therapy: Patient Spontanous Breathing and Patient connected to nasal cannula oxygen  Post-op Assessment: Report given to RN and Post -op Vital signs reviewed and stable  Post vital signs: Reviewed  Last Vitals:  Vitals Value Taken Time  BP 133/87 02/29/20 1014  Temp 36.5 C 02/29/20 1013  Pulse 81 02/29/20 1016  Resp 18 02/29/20 1016  SpO2 95 % 02/29/20 1016  Vitals shown include unvalidated device data.  Last Pain:  Vitals:   02/29/20 1013  TempSrc: Temporal  PainSc: Asleep      Patients Stated Pain Goal: 3 (19/91/44 [id number])  Complications: No complications documented.

## 2020-02-29 NOTE — Interval H&P Note (Signed)
History and Physical Interval Note:  02/29/2020 7:25 AM  Connor [name]  has presented today for surgery, with the diagnosis of afib.  The various methods of treatment have been discussed with the patient and family. After consideration of risks, benefits and other options for treatment, the patient has consented to  Procedure(s): ATRIAL FIBRILLATION ABLATION (N/A) as a surgical intervention.  The patient's history has been reviewed, patient examined, no change in status, stable for surgery.  I have reviewed the patient's chart and labs.  Questions were answered to the patient's satisfaction.    Risk, benefits, and alternatives to EP study and radiofrequency ablation for afib were also discussed in detail today. These risks include but are not limited to stroke, bleeding, vascular damage, tamponade, perforation, damage to the esophagus, lungs, and other structures, pulmonary vein stenosis, worsening renal function, and death. The patient understands these risk and wishes to proceed.    He reports compliance with xarelto without interruption.  Echo and Cardiac CT are reviewed with him at length today.   [name]

## 2020-02-29 NOTE — Progress Notes (Signed)
Discharge instructions reviewed with pt and his wife (via telephone) both voice understanding.  

## 2020-03-01 ENCOUNTER — Encounter (HOSPITAL_COMMUNITY): Payer: Self-pay | Admitting: Internal Medicine

## 2020-03-01 LAB — POCT ACTIVATED CLOTTING TIME
Activated Clotting Time: 307 seconds
Activated Clotting Time: 340 seconds
Activated Clotting Time: 329 seconds

## 2020-03-03 DIAGNOSIS — H353132 Nonexudative age-related macular degeneration, bilateral, intermediate dry stage: Secondary | ICD-10-CM | POA: Diagnosis not present

## 2020-03-10 DIAGNOSIS — Z79899 Other long term (current) drug therapy: Secondary | ICD-10-CM | POA: Diagnosis not present

## 2020-03-10 DIAGNOSIS — Z5111 Encounter for antineoplastic chemotherapy: Secondary | ICD-10-CM | POA: Diagnosis not present

## 2020-03-10 DIAGNOSIS — C61 Malignant neoplasm of prostate: Secondary | ICD-10-CM | POA: Diagnosis not present

## 2020-03-20 DIAGNOSIS — I739 Peripheral vascular disease, unspecified: Secondary | ICD-10-CM | POA: Diagnosis not present

## 2020-03-20 DIAGNOSIS — B351 Tinea unguium: Secondary | ICD-10-CM | POA: Diagnosis not present

## 2020-03-20 DIAGNOSIS — I872 Venous insufficiency (chronic) (peripheral): Secondary | ICD-10-CM | POA: Diagnosis not present

## 2020-03-20 DIAGNOSIS — L602 Onychogryphosis: Secondary | ICD-10-CM | POA: Diagnosis not present

## 2020-03-20 DIAGNOSIS — L84 Corns and callosities: Secondary | ICD-10-CM | POA: Diagnosis not present

## 2020-03-28 ENCOUNTER — Ambulatory Visit (HOSPITAL_COMMUNITY)
Admission: RE | Admit: 2020-03-28 | Discharge: 2020-03-28 | Disposition: A | Discharge status: Home / Self Care | Payer: Medicare PPO | Source: Ambulatory Visit | Attending: Physician Assistant | Admitting: Physician Assistant

## 2020-03-28 ENCOUNTER — Other Ambulatory Visit: Payer: Self-pay

## 2020-03-28 VITALS — BP 156/94 | HR 77 | Ht 72.0 in | Wt 228.4 lb

## 2020-03-28 DIAGNOSIS — Z79899 Other long term (current) drug therapy: Secondary | ICD-10-CM | POA: Diagnosis not present

## 2020-03-28 DIAGNOSIS — Z8249 Family history of ischemic heart disease and other diseases of the circulatory system: Secondary | ICD-10-CM | POA: Diagnosis not present

## 2020-03-28 DIAGNOSIS — E785 Hyperlipidemia, unspecified: Secondary | ICD-10-CM | POA: Insufficient documentation

## 2020-03-28 DIAGNOSIS — Z87891 Personal history of nicotine dependence: Secondary | ICD-10-CM | POA: Diagnosis not present

## 2020-03-28 DIAGNOSIS — I428 Other cardiomyopathies: Secondary | ICD-10-CM | POA: Diagnosis not present

## 2020-03-28 DIAGNOSIS — E669 Obesity, unspecified: Secondary | ICD-10-CM | POA: Insufficient documentation

## 2020-03-28 DIAGNOSIS — D6869 Other thrombophilia: Secondary | ICD-10-CM | POA: Insufficient documentation

## 2020-03-28 DIAGNOSIS — Z9989 Dependence on other enabling machines and devices: Secondary | ICD-10-CM | POA: Insufficient documentation

## 2020-03-28 DIAGNOSIS — Z7901 Long term (current) use of anticoagulants: Secondary | ICD-10-CM | POA: Insufficient documentation

## 2020-03-28 DIAGNOSIS — I480 Paroxysmal atrial fibrillation: Secondary | ICD-10-CM | POA: Diagnosis not present

## 2020-03-28 DIAGNOSIS — I10 Essential (primary) hypertension: Secondary | ICD-10-CM | POA: Diagnosis not present

## 2020-03-28 DIAGNOSIS — Z6830 Body mass index (BMI) 30.0-30.9, adult: Secondary | ICD-10-CM | POA: Diagnosis not present

## 2020-03-28 DIAGNOSIS — Z86718 Personal history of other venous thrombosis and embolism: Secondary | ICD-10-CM | POA: Insufficient documentation

## 2020-03-28 DIAGNOSIS — G4733 Obstructive sleep apnea (adult) (pediatric): Secondary | ICD-10-CM | POA: Insufficient documentation

## 2020-03-28 NOTE — Progress Notes (Signed)
Primary Care Physician: Darreld Mclean, MD Primary Electrophysiologist: Dr Rayann Heman Referring Physician: Dr Nena Polio III is a 86 y.o. male with a history of HTN, HLD, NICM, OSA, and atrial fibrillation who presents for follow up in the [facility]. Patient is on Xarelto for a CHADS2VASC score of 4. Patient has had afib ablations in 2017 and 2018. His most recent ablation was on 02/29/20 with Dr Rayann Heman. Patient reports he has felt well since ablation. He brings in his Kardia strips for review which shows 3-4 episodes since ablation, longest lasting 48 hours. These were rate controlled. His overall afib burden has improved. He denies CP, swallowing, or groin issues.   Today, he denies symptoms of palpitations, chest pain, shortness of breath, orthopnea, PND, lower extremity edema, dizziness, presyncope, syncope, snoring, daytime somnolence, bleeding, or neurologic sequela. The patient is tolerating medications without difficulties and is otherwise without complaint today.    Atrial Fibrillation Risk Factors:  he does have symptoms or diagnosis of sleep apnea. he is compliant with CPAP therapy. he does not have a history of rheumatic fever. he does not have a history of alcohol use.   he has a BMI of Body mass index is 30.98 kg/m.Marland Kitchen Filed Weights   03/28/20 0918  Weight: 103.6 kg    Family History  Problem Relation Age of Onset  . Heart attack Father   . Lung disease Mother   . Arrhythmia Sister   . Atrial fibrillation Brother   . Diabetes Neg Hx      Atrial Fibrillation Management history:  Previous antiarrhythmic drugs: dofetilide  Previous cardioversions: 2014 Previous ablations: 2017, 2018, 02/29/20 CHADS2VASC score: 4 Anticoagulation history: Xarelto   Past Medical History:  Diagnosis Date  . Benign localized prostatic hyperplasia with lower urinary tract symptoms (LUTS)    urologist-- dr Diona Fanti  . Chronic  anticoagulation    Xarelto for afib  . Chronic constipation   . Chronic cystitis   . Gross hematuria   . History of DVT of lower extremity 06/08/2010   right femoral dvt dx post op total hip 05-08-2010  . History of gastric ulcer    remote  . History of kidney stones    one stone. remote hx  . Hyperlipidemia   . Hypertension    Dx by Dr. Lance Muss around age  32   . NICM (nonischemic cardiomyopathy) ([place])    per echo 06-11-2017,  ef 45-50% with diffuse hypokinesis,  G1DD  . OSA on CPAP 2017   compliant with CPAP.  managed by Dr Elsworth Soho.  . Osteoarthritis   . Other urethral stricture, male, meatal    chronic;  pt self dilates  . Paroxysmal atrial fibrillation [facility]) cardiologist-- dr Rayann Heman   first dx 2009/   a. documented by Dr Barrett Shell on office EKG 9/12. b. maintained on Tikosyn and Xarelto. c. s/p DCCV 's.  d.  x3  EP w/ ablation atrial fib last one 12-05-2016  . Prediabetes   . Premature ventricular contraction   . PVC's (premature ventricular contractions)   . RA (rheumatoid arthritis) [facility])    rheumatologist--- Dr Page Spiro,  treated with Humira  . RBBB (right bundle branch block)    Hx of a fib, ablation Aug 2018  . Rheumatoid arthritis ([place])   . Vitamin D deficiency   . Wears hearing aid in both ears    Past Surgical History:  Procedure Laterality Date  . ABLATION OF DYSRHYTHMIC  FOCUS  08/29/2015  . ATRIAL FIBRILLATION ABLATION  12/05/2016  . ATRIAL FIBRILLATION ABLATION N/A 12/05/2016   Procedure: Atrial Fibrillation Ablation;  Surgeon: [name], MD;  Location: [place] [facility];  Service: Cardiovascular;  Laterality: N/A;  . ATRIAL FIBRILLATION ABLATION N/A 02/29/2020   Procedure: ATRIAL FIBRILLATION ABLATION;  Surgeon: [name], MD;  Location: [place] [facility];  Service: Cardiovascular;  Laterality: N/A;  . ATRIAL FLUTTER ABLATION  09/2010   by Greggory Brandy  . BUNIONECTOMY Right 1990s  . CARDIOVERSION N/A 10/28/2012   Procedure: CARDIOVERSION;  Surgeon: Burnell Blanks, MD;  Location: [place];  Service: Cardiovascular;  Laterality: N/A;  . CATARACT EXTRACTION W/ INTRAOCULAR LENS  IMPLANT, BILATERAL  2019  . CYSTOSCOPY WITH BIOPSY N/A 09/03/2018   Procedure: CYSTOSCOPY WITH FULGURATION HUDJSHFWY;  Surgeon: Franchot Gallo, MD;  Location: [facility];  Service: Urology;  Laterality: N/A;  30 MINS  . ELECTROPHYSIOLOGIC STUDY N/A 08/29/2015   Procedure: Atrial Fibrillation Ablation;  Surgeon: [name], MD;  Location: [place] [facility];  Service: Cardiovascular;  Laterality: N/A;  . KNEE SURGERY Bilateral x3   1960s & 1970s   open  . LEFT HEART CATHETERIZATION WITH CORONARY ANGIOGRAM N/A 06/07/2011   Procedure: LEFT HEART CATHETERIZATION WITH CORONARY ANGIOGRAM;  Surgeon: Peter M Martinique, MD;  Location: [facility] CATH LAB;  Service: Cardiovascular;  Laterality: N/A;    Normal coronary arteries,  low normal LVF  . TONSILLECTOMY AND ADENOIDECTOMY  age 64  . TOTAL HIP ARTHROPLASTY Right 06/04/10    dr Noemi Chapel  @[facility]   . TOTAL KNEE ARTHROPLASTY Bilateral right 1995;  left 1997  . TRANSURETHRAL RESECTION OF PROSTATE  06-29-2002   dr humphries  @[facility]   . TRANSURETHRAL RESECTION OF PROSTATE N/A 07/06/2018   Procedure: TRANSURETHRAL RESECTION OF THE PROSTATE (TURP);  Surgeon: Franchot Gallo, MD;  Location: [facility];  Service: Urology;  Laterality: N/A;  45 MINS    Current Outpatient Medications  Medication Sig Dispense Refill  . acetaminophen (TYLENOL) 500 MG tablet Take 1,000-1,500 mg by mouth 2 (two) times daily as needed for moderate pain.     Marland Kitchen atorvastatin (LIPITOR) 10 MG tablet Take 1 tablet (10 mg total) by mouth daily. 90 tablet 3  . Carboxymethylcellulose Sodium (THERATEARS OP) Apply 1 drop to eye daily as needed (dry eyes).    . Cholecalciferol (VITAMIN D3) 5000 UNITS TABS Take 5,000 Units by mouth See admin instructions. Every 4 days    . clindamycin (CLEOCIN T) 1 % external solution Apply 1 application topically daily as needed (for bumps in  scalp). Apply to scalp  2  . Cranberry 1000 MG CAPS Take 2,000-3,000 mg by mouth See admin instructions. Take 20000 mg in the morning and 3000 mg in the evening    . Docusate Calcium (STOOL SOFTENER PO) Take 2 tablets by mouth at bedtime.     . finasteride (PROSCAR) 5 MG tablet Take 5 mg by mouth every morning.   3  . HUMIRA PEN 40 MG/0.4ML PNKT Inject 40 mg as directed every 7 (seven) days. Every Tuesday  6  . lisinopril (ZESTRIL) 10 MG tablet TAKE 1 TABLET BY MOUTH EVERY DAY (Patient taking differently: Take 10 mg by mouth daily. ) 90 tablet 1  . metoprolol succinate (TOPROL-XL) 50 MG 24 hr tablet Taking 1/2 tablet in the am and 1 tablet by mouth in the evening    . metoprolol tartrate (LOPRESSOR) 25 MG tablet TAKE 1 TABLET BY MOUTH EVERY 6 HOURS AS  NEEDED FOR FAST HEART RATES (Patient taking differently: Take 25 mg by mouth every 6 (six) hours as needed (FOR FAST HEART RATES). ) 90 tablet 2  . Multiple Vitamins-Minerals (PRESERVISION AREDS 2) CAPS Take 1 capsule by mouth 2 (two) times a day.    . Omega-3 Fatty Acids (OMEGA 3 PO) Take 3 capsules by mouth every evening.     Marland Kitchen omeprazole (PRILOSEC) 20 MG capsule Take 20 mg by mouth daily.     . solifenacin (VESICARE) 10 MG tablet Take 10 mg by mouth at bedtime.     . tamsulosin (FLOMAX) 0.4 MG CAPS capsule Take 1 capsule (0.4 mg total) by mouth daily after supper. 30 capsule 5  . vitamin B-12 (CYANOCOBALAMIN) 500 MCG tablet Take 500 mcg by mouth every evening.     Alveda Reasons 20 MG TABS tablet TAKE 1 TABLET BY MOUTH EVERY DAY IN THE EVENING (Patient taking differently: Take 20 mg by mouth every evening. ) 90 tablet 1   No current facility-administered medications for this encounter.    Allergies  Allergen Reactions  . Penicillins Itching, Swelling and Other (See Comments)    Did it involve swelling of the face/tongue/throat, SOB, or low BP? No Did it involve sudden or severe rash/hives, skin peeling, or any reaction on the inside of your mouth  or nose? No Did you need to seek medical attention at a hospital or doctor's office? No When did it last happen?30 + years If all above answers are "NO", may proceed with cephalosporin use.      Social History   Socioeconomic History  . Marital status: Married    Spouse name: Joycelyn Schmid  . Number of children: Not on file  . Years of education: Not on file  . Highest education level: Not on file  Occupational History  . Not on file  Tobacco Use  . Smoking status: Former Smoker    Packs/day: 1.00    Years: 30.00    Pack years: 30.00    Types: Cigarettes    Quit date: 05/06/1978    Years since quitting: 41.9  . Smokeless tobacco: Never Used  Vaping Use  . Vaping Use: Never used  Substance and Sexual Activity  . Alcohol use: Yes    Comment: occasionally wine and beer  . Drug use: Never  . Sexual activity: Not on file  Other Topics Concern  . Not on file  Social History Narrative   he patient lives in [place] with his spouse.  Retired.   Social Determinants of Health   Financial Resource Strain:   . Difficulty of Paying Living Expenses: Not on file  Food Insecurity:   . Worried About Charity fundraiser in the Last Year: Not on file  . Ran Out of Food in the Last Year: Not on file  Transportation Needs:   . Lack of Transportation (Medical): Not on file  . Lack of Transportation (Non-Medical): Not on file  Physical Activity:   . Days of Exercise per Week: Not on file  . Minutes of Exercise per Session: Not on file  Stress:   . Feeling of Stress : Not on file  Social Connections:   . Frequency of Communication with Friends and Family: Not on file  . Frequency of Social Gatherings with Friends and Family: Not on file  . Attends Religious Services: Not on file  . Active Member of Clubs or Organizations: Not on file  . Attends Archivist Meetings: Not on file  .  Marital Status: Not on file  Intimate Partner Violence:   . Fear of Current or  Ex-Partner: Not on file  . Emotionally Abused: Not on file  . Physically Abused: Not on file  . Sexually Abused: Not on file     ROS- All systems are reviewed and negative except as per the HPI above.  Physical Exam: Vitals:   03/28/20 0918  BP: (!) 156/94  Pulse: 77  Weight: 103.6 kg  Height: 6' (1.829 m)    GEN- The patient is well appearing obese elderly male, alert and oriented x 3 today.   Head- normocephalic, atraumatic Eyes-  Sclera clear, conjunctiva pink Ears- hearing intact Oropharynx- clear Neck- supple  Lungs- Clear to ausculation bilaterally, normal work of breathing Heart- Regular rate and rhythm, no murmurs, rubs or gallops  GI- soft, NT, ND, + BS Extremities- no clubbing, cyanosis, or edema MS- no significant deformity or atrophy Skin- no rash or lesion Psych- euthymic mood, full affect Neuro- strength and sensation are intact  Wt Readings from Last 3 Encounters:  03/28/20 103.6 kg  02/29/20 101.6 kg  02/02/20 101.9 kg    EKG today demonstrates SR HR 77, RBBB, PR 164, QRS 140, QTc 516  Echo 02/25/20 demonstrated  1. Left ventricular ejection fraction, by estimation, is 50 to 55%. The  left ventricle has low normal function. The left ventricle has no regional  wall motion abnormalities. The left ventricular internal cavity size was  mildly dilated. Left ventricular  diastolic parameters are consistent with Grade I diastolic dysfunction  (impaired relaxation).  2. Right ventricular systolic function is normal. The right ventricular  size is mildly enlarged. There is mildly elevated pulmonary artery  systolic pressure. The estimated right ventricular systolic pressure is  74.1 mmHg.  3. Left atrial size was mildly dilated.  4. Right atrial size was mildly dilated.  5. The mitral valve is normal in structure. Trivial mitral valve  regurgitation. No evidence of mitral stenosis.  6. The aortic valve is normal in structure. Aortic valve  regurgitation is  not visualized. No aortic stenosis is present.  7. Aortic dilatation noted. There is mild dilatation at the level of the  sinuses of Valsalva, measuring 38 mm.  8. The inferior vena cava is normal in size with greater than 50%  respiratory variability, suggesting right atrial pressure of 3 mmHg.   Comparison(s): Prior images reviewed side by side. The left ventricular  function has improved. 06/11/17 EF 45-50%.   Epic records are reviewed at length today  CHA2DS2-VASc Score = 4  The patient's score is based upon: CHF History: 1 HTN History: 1 Diabetes History: 0 Stroke History: 0 Vascular Disease History: 0 Age Score: 2 Gender Score: 0      ASSESSMENT AND PLAN: 1. Paroxysmal Atrial Fibrillation (ICD10:  I48.0) The patient's CHA2DS2-VASc score is 4, indicating a 4.8% annual risk of stroke.   S/p repeat ablation 02/29/20 Reassured patient that some afib is expected post ablation although overall burden is less since ablation.  Continue Xarelto 20 mg daily Continue Toprol 25 mg AM and 50 mg PM Kardia for home monitoring.   2. Secondary Hypercoagulable State (ICD10:  D68.69) The patient is at significant risk for stroke/thromboembolism based upon his CHA2DS2-VASc Score of 4.  Continue Rivaroxaban (Xarelto).   3. Obesity Body mass index is 30.98 kg/m. Lifestyle modification was discussed at length including regular exercise and weight reduction.  4. Obstructive sleep apnea The importance of adequate treatment of sleep apnea was  discussed today in order to improve our ability to maintain sinus rhythm long term. Patient reports compliance with CPAP therapy.  5. NICM EF 50-55% No signs or symptoms of fluid overload.  6. HTN Elevated today, patient reports good control at home. No changes today.    Follow up with Dr Rayann Heman as scheduled.    [facility] [address] [place], [place]  [id number] [phone] 03/28/2020 9:48 AM

## 2020-04-02 DIAGNOSIS — H353132 Nonexudative age-related macular degeneration, bilateral, intermediate dry stage: Secondary | ICD-10-CM | POA: Diagnosis not present

## 2020-04-04 ENCOUNTER — Encounter: Payer: Self-pay | Admitting: Family Medicine

## 2020-04-20 DIAGNOSIS — Z79899 Other long term (current) drug therapy: Secondary | ICD-10-CM | POA: Diagnosis not present

## 2020-04-20 DIAGNOSIS — E669 Obesity, unspecified: Secondary | ICD-10-CM | POA: Diagnosis not present

## 2020-04-20 DIAGNOSIS — M0600 Rheumatoid arthritis without rheumatoid factor, unspecified site: Secondary | ICD-10-CM | POA: Diagnosis not present

## 2020-04-20 DIAGNOSIS — M2550 Pain in unspecified joint: Secondary | ICD-10-CM | POA: Diagnosis not present

## 2020-04-20 DIAGNOSIS — M150 Primary generalized (osteo)arthritis: Secondary | ICD-10-CM | POA: Diagnosis not present

## 2020-04-20 DIAGNOSIS — Z6830 Body mass index (BMI) 30.0-30.9, adult: Secondary | ICD-10-CM | POA: Diagnosis not present

## 2020-05-02 DIAGNOSIS — H353132 Nonexudative age-related macular degeneration, bilateral, intermediate dry stage: Secondary | ICD-10-CM | POA: Diagnosis not present

## 2020-05-03 ENCOUNTER — Ambulatory Visit: Payer: Medicare PPO | Admitting: Family Medicine

## 2020-05-03 ENCOUNTER — Encounter: Payer: Self-pay | Admitting: Family Medicine

## 2020-05-03 ENCOUNTER — Other Ambulatory Visit: Payer: Self-pay

## 2020-05-03 VITALS — Resp 15 | Ht 72.0 in | Wt 225.0 lb

## 2020-05-03 DIAGNOSIS — R42 Dizziness and giddiness: Secondary | ICD-10-CM

## 2020-05-03 NOTE — Progress Notes (Signed)
[facility] [employer] [place], [place], [place] [address] [phone]  Date:  05/03/2020   Name:  Connor [name]   DOB:  1933/08/12   MRN:  [id number]  PCP:  Darreld Mclean, MD    Chief Complaint: Dizziness (Felt off balance this morning, "falling", never experienced before denies chest pain/)   History of Present Illness:  Connor [name] is a 86 y.o. very pleasant male patient who presents with the following:  Pt here today with concern of vertigo sx Last seen by myself in July for a CPE Gentleman with history of atrial fibrillation, left ventricle dysfunction, prostate cancer, seronegative rheumatoid arthritis and osteoarthritis He is taking his xarelto for a fib, which is intermittent He got up out of bed around 0500 this am and felt dizzy, he describes this as a sensation of movement as opposed to lightheadedness He held onto the wall and made it to the bathroom  Never had this in the past  (He has had lightheadedness but never vertigo like this)  When he stays still he feels fine - vertigo sx with movement only.  However, he notes that his vertigo symptoms now seem to be resolved No CP or SOB, no headache or other neurologic symptoms note No a fib since his most recent ablation 2 months ago He is heading to the beach now and wanted to make sure he was okay  covid UPTD Flu shot UTD Most recent labs done in October of this year   Patient Active Problem List   Diagnosis Date Noted  . Secondary hypercoagulable state ([place]) 03/28/2020  . Malignant neoplasm of prostate ([place]) 09/11/2018  . Enlarged prostate with urinary obstruction 07/06/2018  . Gastrocnemius strain, right, initial encounter 12/10/2017  . Baker's cyst of knee, right 12/10/2017  . Obesity 03/24/2017  . Paroxysmal atrial fibrillation ([place]) 12/05/2016  . Near syncope 04/20/2016  . Leg hematoma, left, initial encounter 04/20/2016  .  Bleeding   . Ecchymosis   . Left hamstring muscle strain   . Muscle tear   . RBBB 08/30/2015  . Chronic systolic dysfunction of left ventricle 07/11/2014  . OSA (obstructive sleep apnea) 04/07/2013  . Multinodular goiter 01/20/2013  . Cervical spondylosis without myelopathy 01/18/2013  . Atrial fibrillation with RVR ([place]) 10/27/2012  . PAF (paroxysmal atrial fibrillation) ([facility]) 05/28/2012  . Left ventricular dysfunction 12/17/2011  . Low back pain 09/11/2011  . Fatigue 05/22/2011  . PVC (premature ventricular contraction) 01/18/2011  . Persistent atrial fibrillation ([facility]) 01/12/2011  . Hyperlipidemia 08/02/2010  . Hypertension 08/02/2010  . Osteoarthritis 08/02/2010  . BPH (benign prostatic hyperplasia) 08/02/2010  . GERD (gastroesophageal reflux disease) 08/02/2010  . h/o Atrial flutter ([facility]) s/p ablation 2012     Past Medical History:  Diagnosis Date  . Benign localized prostatic hyperplasia with lower urinary tract symptoms (LUTS)    urologist-- dr Diona Fanti  . Chronic anticoagulation    Xarelto for afib  . Chronic constipation   . Chronic cystitis   . Gross hematuria   . History of DVT of lower extremity 06/08/2010   right femoral dvt dx post op total hip 05-08-2010  . History of gastric ulcer    remote  . History of kidney stones    one stone. remote hx  . Hyperlipidemia   . Hypertension    Dx by Dr. Lance Muss around age  60   . NICM (nonischemic cardiomyopathy) ([facility] [place])  per echo 06-11-2017,  ef 45-50% with diffuse hypokinesis,  G1DD  . OSA on CPAP 2017   compliant with CPAP.  managed by Dr Elsworth Soho.  . Osteoarthritis   . Other urethral stricture, male, meatal    chronic;  pt self dilates  . Paroxysmal atrial fibrillation [facility]) cardiologist-- dr Rayann Heman   first dx 2009/   a. documented by Dr Barrett Shell on office EKG 9/12. b. maintained on Tikosyn and Xarelto. c. s/p DCCV 's.  d.  x3  EP w/ ablation atrial fib last one 12-05-2016  . Prediabetes   . Premature ventricular  contraction   . PVC's (premature ventricular contractions)   . RA (rheumatoid arthritis) [facility])    rheumatologist--- Dr Page Spiro,  treated with Humira  . RBBB (right bundle branch block)    Hx of a fib, ablation Aug 2018  . Rheumatoid arthritis ([facility])   . Vitamin D deficiency   . Wears hearing aid in both ears     Past Surgical History:  Procedure Laterality Date  . ABLATION OF DYSRHYTHMIC FOCUS  08/29/2015  . ATRIAL FIBRILLATION ABLATION  12/05/2016  . ATRIAL FIBRILLATION ABLATION N/A 12/05/2016   Procedure: Atrial Fibrillation Ablation;  Surgeon: [name], MD;  Location: [facility];  Service: Cardiovascular;  Laterality: N/A;  . ATRIAL FIBRILLATION ABLATION N/A 02/29/2020   Procedure: ATRIAL FIBRILLATION ABLATION;  Surgeon: [name], MD;  Location: [facility];  Service: Cardiovascular;  Laterality: N/A;  . ATRIAL FLUTTER ABLATION  09/2010   by Greggory Brandy  . BUNIONECTOMY Right 1990s  . CARDIOVERSION N/A 10/28/2012   Procedure: CARDIOVERSION;  Surgeon: Burnell Blanks, MD;  Location: [facility];  Service: Cardiovascular;  Laterality: N/A;  . CATARACT EXTRACTION W/ INTRAOCULAR LENS  IMPLANT, BILATERAL  2019  . CYSTOSCOPY WITH BIOPSY N/A 09/03/2018   Procedure: CYSTOSCOPY WITH FULGURATION [id number];  Surgeon: Franchot Gallo, MD;  Location: [facility];  Service: Urology;  Laterality: N/A;  30 MINS  . ELECTROPHYSIOLOGIC STUDY N/A 08/29/2015   Procedure: Atrial Fibrillation Ablation;  Surgeon: [name], MD;  Location: [facility];  Service: Cardiovascular;  Laterality: N/A;  . KNEE SURGERY Bilateral x3   1960s & 1970s   open  . LEFT HEART CATHETERIZATION WITH CORONARY ANGIOGRAM N/A 06/07/2011   Procedure: LEFT HEART CATHETERIZATION WITH CORONARY ANGIOGRAM;  Surgeon: Peter M Martinique, MD;  Location[facility] C[facility];  Service: Cardiovascular;  Laterality: N/A;    Normal coronary arteries,  low normal LVF  . TONSILLECTOMY AND ADENOIDECTOMY  age 41  . TOTAL HIP ARTHROPLASTY  Right 06/04/10    dr Noemi Chapel  @[facility]   . TOTAL KNEE ARTHROPLASTY Bilateral right 1995;  left 1997  . TRANSURETHRAL RESECTION OF PROSTATE  06-29-2002   dr humphries  @[facility]   . TRANSURETHRAL RESECTION OF PROSTATE N/A 07/06/2018   Procedure: TRANSURETHRAL RESECTION OF THE PROSTATE (TURP);  Surgeon: Franchot Gallo, MD;  Location: [facility];  Service: Urology;  Laterality: N/A;  4 MINS    Social History   Tobacco Use  . Smoking status: Former Smoker    Packs/day: 1.00    Years: 30.00    Pack years: 30.00    Types: Cigarettes    Quit date: 05/06/1978    Years since quitting: 42.0  . Smokeless tobacco: Never Used  Vaping Use  . Vaping Use: Never used  Substance Use Topics  . Alcohol use: Yes    Comment: occasionally wine and beer  . Drug use: Never  Family History  Problem Relation Age of Onset  . Heart attack Father   . Lung disease Mother   . Arrhythmia Sister   . Atrial fibrillation Brother   . Diabetes Neg Hx     Allergies  Allergen Reactions  . Penicillins Itching, Swelling and Other (See Comments)    Did it involve swelling of the face/tongue/throat, SOB, or low BP? No Did it involve sudden or severe rash/hives, skin peeling, or any reaction on the inside of your mouth or nose? No Did you need to seek medical attention at a hospital or doctor's office? No When did it last happen?30 + years If all above answers are "NO", may proceed with cephalosporin use.      Medication list has been reviewed and updated.  Current Outpatient Medications on File Prior to Visit  Medication Sig Dispense Refill  . acetaminophen (TYLENOL) 500 MG tablet Take 1,000-1,500 mg by mouth 2 (two) times daily as needed for moderate pain.     Marland Kitchen atorvastatin (LIPITOR) 10 MG tablet Take 1 tablet (10 mg total) by mouth daily. 90 tablet 3  . Carboxymethylcellulose Sodium (THERATEARS OP) Apply 1 drop to eye daily as needed (dry eyes).    . Cholecalciferol (VITAMIN D3) 5000 UNITS  TABS Take 5,000 Units by mouth See admin instructions. Every 4 days    . clindamycin (CLEOCIN T) 1 % external solution Apply 1 application topically daily as needed (for bumps in scalp). Apply to scalp  2  . Cranberry 1000 MG CAPS Take 2,000-3,000 mg by mouth See admin instructions. Take 09323 mg in the morning and 3000 mg in the evening    . Docusate Calcium (STOOL SOFTENER PO) Take 2 tablets by mouth at bedtime.     . finasteride (PROSCAR) 5 MG tablet Take 5 mg by mouth every morning.   3  . HUMIRA PEN 40 MG/0.4ML PNKT Inject 40 mg as directed every 7 (seven) days. Every Tuesday  6  . lisinopril (ZESTRIL) 10 MG tablet TAKE 1 TABLET BY MOUTH EVERY DAY (Patient taking differently: Take 10 mg by mouth daily.) 90 tablet 1  . metoprolol succinate (TOPROL-XL) 50 MG 24 hr tablet Taking 1/2 tablet in the am and 1 tablet by mouth in the evening    . metoprolol tartrate (LOPRESSOR) 25 MG tablet TAKE 1 TABLET BY MOUTH EVERY 6 HOURS AS NEEDED FOR FAST HEART RATES (Patient taking differently: Take 25 mg by mouth every 6 (six) hours as needed (FOR FAST HEART RATES).) 90 tablet 2  . Multiple Vitamins-Minerals (PRESERVISION AREDS 2) CAPS Take 1 capsule by mouth 2 (two) times a day.    . Omega-3 Fatty Acids (OMEGA 3 PO) Take 3 capsules by mouth every evening.    Marland Kitchen omeprazole (PRILOSEC) 20 MG capsule Take 20 mg by mouth daily.    . solifenacin (VESICARE) 10 MG tablet Take 10 mg by mouth at bedtime.     . tamsulosin (FLOMAX) 0.4 MG CAPS capsule Take 1 capsule (0.4 mg total) by mouth daily after supper. 30 capsule 5  . vitamin B-12 (CYANOCOBALAMIN) 500 MCG tablet Take 500 mcg by mouth every evening.     Carlena Hurl 20 MG TABS tablet TAKE 1 TABLET BY MOUTH EVERY DAY IN THE EVENING (Patient taking differently: Take 20 mg by mouth every evening.) 90 tablet 1   No current facility-administered medications on file prior to visit.    Review of Systems:  As per HPI- otherwise negative.   Physical Examination: Vitals:  05/03/20 1014  Resp: 15  SpO2: 97%   Vitals:   05/03/20 1014  Weight: 225 lb (102.1 kg)  Height: 6' (1.829 m)   Body mass index is 30.52 kg/m. Ideal Body Weight: Weight in (lb) to have BMI = 25: 183.9  GEN: no acute distress.  Tall build, looks well and his normal self-hard of hearing HEENT: Atraumatic, Normocephalic.   Bilateral TM wnl, oropharynx normal.  PEERL,EOMI.   Ears and Nose: No external deformity. CV: RRR, No M/G/R. No JVD. No thrill. No extra heart sounds. PULM: CTA B, no wheezes, crackles, rhonchi. No retractions. No resp. distress. No accessory muscle use. ABD: S, NT, ND. No rebound. No HSM. EXTR: No c/c/e PSYCH: Normally interactive. Conversant.  Normal Romberg Patient has difficulty getting into tandem stance due to hip pain Normal strength, sensation, DTR of all extremities.  Normal facial movement and sensation Orthostatic testing reassuring as below-slight increase in pulse with standing is present Negative Dix-Hallpike bilaterally  Orthostatic VS for the past 72 hrs (Last 3 readings):  Orthostatic BP Patient Position BP Location Cuff Size Orthostatic Pulse  05/03/20 1016 124/82 Standing Left Arm Normal 87  05/03/20 1015 140/82 Supine Left Arm Normal 79  05/03/20 1014 138/84 Sitting Left Arm Normal 76     Assessment and Plan: Vertigo  Patient seen today due to concern of vertigo which he noted early this morning.  He had symptoms likely consistent with BPPV Symptoms were transient and now resolved, he currently feels well.  Neurologic exam is normal Advised patient that his symptoms were likely due to to an inner ear vertigo.  Since he is feeling well, for the time being we will observe closely.  He is asked to please contact me or otherwise seek care if his symptoms return, or if any other problems  This visit occurred during the SARS-CoV-2 public health emergency.  Safety protocols were in place, including screening questions prior to the visit,  additional usage of staff PPE, and extensive cleaning of exam room while observing appropriate contact time as indicated for disinfecting solutions.    Signed Lamar Blinks, MD

## 2020-05-03 NOTE — Patient Instructions (Signed)
It was good to see you today - I hope you have a great time at the beach!  It sounds like you had an episode of inner ear vertigo which has now cleared up. However, it anything else happens or if symptoms return please let me know!

## 2020-05-04 ENCOUNTER — Encounter: Payer: Self-pay | Admitting: Family Medicine

## 2020-05-04 DIAGNOSIS — R42 Dizziness and giddiness: Secondary | ICD-10-CM

## 2020-05-10 ENCOUNTER — Ambulatory Visit: Payer: Medicare PPO | Admitting: Internal Medicine

## 2020-05-10 ENCOUNTER — Telehealth: Payer: Self-pay | Admitting: Neurology

## 2020-05-10 ENCOUNTER — Ambulatory Visit: Payer: Medicare PPO | Admitting: Neurology

## 2020-05-10 ENCOUNTER — Encounter: Payer: Self-pay | Admitting: Neurology

## 2020-05-10 VITALS — BP 165/91 | HR 74 | Ht 72.0 in | Wt 225.0 lb

## 2020-05-10 DIAGNOSIS — R42 Dizziness and giddiness: Secondary | ICD-10-CM | POA: Diagnosis not present

## 2020-05-10 DIAGNOSIS — H81399 Other peripheral vertigo, unspecified ear: Secondary | ICD-10-CM | POA: Diagnosis not present

## 2020-05-10 DIAGNOSIS — I951 Orthostatic hypotension: Secondary | ICD-10-CM

## 2020-05-10 NOTE — Telephone Encounter (Signed)
Francine Graven Berkley Harvey: [phone] (exp. 05/10/20 to 06/09/20) order sent to GI. They will reach out to the patient to schedule.

## 2020-05-10 NOTE — Progress Notes (Signed)
Subjective:    Patient ID: Connor [name] is a 87 y.o. male.  HPI     Star Age, MD, PhD [facility] [address] [place], [place] [address]  Dear Dr. Lorelei Pont,   I saw your patient, Connor [name], upon your kind request in my neurologic clinic today for initial consultation of his dizziness, concern for positional vertigo.  The patient is unaccompanied today.  As you know, Connor [name] is a 87 year old right-handed gentleman with an underlying complex medical history of paroxysmal A. fib with status post ablation x 3, hearing loss, vitamin D deficiency, rheumatoid arthritis, right bundle branch block, obstructive sleep apnea on CPAP, nonischemic cardiomyopathy, hypertension, hyperlipidemia, kidney stones, history of gastric ulcer, DVT, PVCs and borderline obesity, who reports new onset vertiginous symptoms since last week.  He reports that he woke up around 5 AM and the room was spinning.  He had to hold onto the wall.  He made it to the bathroom and felt a little better.  He has had intermittent daily symptoms since then, brief spinning sensation, no sudden onset of one-sided weakness or numbness or tingling or droopy face or slurring of speech, no sudden onset of headache, no tinnitus.  He is rather hard of hearing which is not new.  He has bilateral hearing aids but only the left one is working.   I reviewed your office note from 05/03/2020.  He was given home exercises.  He was advised to try meclizine as needed but also cautioned as it can cause sleepiness. He has not had any physical therapy and no recent head imaging.   He has not fallen thankfully.  He admits that he does not sleep well.  He used his CPAP only 3-1/2 hours last night.  He reports that he does not like his CPAP machine ("I hate it").  He sees Dr. Elsworth Soho for this and has not scheduled a follow-up appointment.  He is typically seen once a year and is due for an appointment.  He  also admits that he does not drink a whole lot of water.  Yesterday he had 8 ounces of water.  On average day he may have 12 ounces of water.  He drinks tea, typically 1 glass/day and occasional soda. He does feel lightheaded occasionally upon standing up.  This is different from his spinning sensation.  He currently denies any vertigo type symptoms at this moment.  He had remote MR imaging of the head, he had a brain MRI with and without contrast and MR angiogram of the head without contrast on 12/242001 and I was able to review the report: [place] STEM, CEREBELLUM, AND CEREBRAL  HEMISPHERES. IN COMPARING THIS EXAM CLOSELY WITH THE EXAM OF 02/21/99, I CANNOT IDENTIFY ANY  DEFINITE NEW INSULT.  LEFT MAXILLARY AND BILATERAL ETHMOID SINUSITIS.  INTRACRANIAL MR ANGIOGRAPHY: IMPRESSION:  NO CHANGE. NORMAL INTRACRANIAL MR ANGIOGRAPHY.  His Past Medical History Is Significant For: Past Medical History:  Diagnosis Date  . Benign localized prostatic hyperplasia with lower urinary tract symptoms (LUTS)    urologist-- dr Diona Fanti  . Chronic anticoagulation    Xarelto for afib  . Chronic constipation   . Chronic cystitis   . Gross hematuria   . History of DVT of lower extremity 06/08/2010   right femoral dvt dx post op total hip 05-08-2010  . History of gastric ulcer    remote  .  History of kidney stones    one stone. remote hx  . Hyperlipidemia   . Hypertension    Dx by Dr. Lance Muss around age  52   . NICM (nonischemic cardiomyopathy) ([place])    per echo 06-11-2017,  ef 45-50% with diffuse hypokinesis,  G1DD  . OSA on CPAP 2017   compliant with CPAP.  managed by Dr Elsworth Soho.  . Osteoarthritis   . Other urethral stricture, male, meatal    chronic;  pt self dilates  . Paroxysmal atrial fibrillation [facility]) cardiologist-- dr Rayann Heman   first dx 2009/   a. documented by Dr Barrett Shell on office EKG 9/12. b. maintained on Tikosyn and  Xarelto. c. s/p DCCV 's.  d.  x3  EP w/ ablation atrial fib last one 12-05-2016  . Prediabetes   . Premature ventricular contraction   . PVC's (premature ventricular contractions)   . RA (rheumatoid arthritis) [facility])    rheumatologist--- Dr Page Spiro,  treated with Humira  . RBBB (right bundle branch block)    Hx of a fib, ablation Aug 2018  . Rheumatoid arthritis ([place])   . Vitamin D deficiency   . Wears hearing aid in both ears     His Past Surgical History Is Significant For: Past Surgical History:  Procedure Laterality Date  . ABLATION OF DYSRHYTHMIC FOCUS  08/29/2015  . ATRIAL FIBRILLATION ABLATION  12/05/2016  . ATRIAL FIBRILLATION ABLATION N/A 12/05/2016   Procedure: Atrial Fibrillation Ablation;  Surgeon: [name], MD;  Location: [place] CV [facility];  Service: Cardiovascular;  Laterality: N/A;  . ATRIAL FIBRILLATION ABLATION N/A 02/29/2020   Procedure: ATRIAL FIBRILLATION ABLATION;  Surgeon: [name], MD;  Location: [facility];  Service: Cardiovascular;  Laterality: N/A;  . ATRIAL FLUTTER ABLATION  09/2010   by Greggory Brandy  . BUNIONECTOMY Right 1990s  . CARDIOVERSION N/A 10/28/2012   Procedure: CARDIOVERSION;  Surgeon: Burnell Blanks, MD;  Location: [facility];  Service: Cardiovascular;  Laterality: N/A;  . CATARACT EXTRACTION W/ INTRAOCULAR LENS  IMPLANT, BILATERAL  2019  . CYSTOSCOPY WITH BIOPSY N/A 09/03/2018   Procedure: CYSTOSCOPY WITH FULGURATION [id number];  Surgeon: Franchot Gallo, MD;  Location: [facility];  Service: Urology;  Laterality: N/A;  30 MINS  . ELECTROPHYSIOLOGIC STUDY N/A 08/29/2015   Procedure: Atrial Fibrillation Ablation;  Surgeon: [name], MD;  Location: [facility];  Service: Cardiovascular;  Laterality: N/A;  . KNEE SURGERY Bilateral x3   1960s & 1970s   open  . LEFT HEART CATHETERIZATION WITH CORONARY ANGIOGRAM N/A 06/07/2011   Procedure: LEFT HEART CATHETERIZATION WITH CORONARY ANGIOGRAM;  Surgeon: Peter M Martinique, MD;  Location[facility]  CATH LAB;  Service: Cardiovascular;  Laterality: N/A;    Normal coronary arteries,  low normal LVF  . TONSILLECTOMY AND ADENOIDECTOMY  age 79  . TOTAL HIP ARTHROPLASTY Right 06/04/10    dr Noemi Chapel  @[facility]   . TOTAL KNEE ARTHROPLASTY Bilateral right 1995;  left 1997  . TRANSURETHRAL RESECTION OF PROSTATE  06-29-2002   dr humphries  @[facility]   . TRANSURETHRAL RESECTION OF PROSTATE N/A 07/06/2018   Procedure: TRANSURETHRAL RESECTION OF THE PROSTATE (TURP);  Surgeon: Franchot Gallo, MD;  Location[facility];  Service: Urology;  Laterality: N/A;  30 MINS    His Family History Is Significant For: Family History  Problem Relation Age of Onset  . Heart attack Father   . Lung disease Mother   . Arrhythmia Sister   . Atrial fibrillation Brother   .  Diabetes Neg Hx     His Social History Is Significant For: Social History   Socioeconomic History  . Marital status: Married    Spouse name: Joycelyn Schmid  . Number of children: Not on file  . Years of education: Not on file  . Highest education level: Not on file  Occupational History  . Not on file  Tobacco Use  . Smoking status: Former Smoker    Packs/day: 1.00    Years: 30.00    Pack years: 30.00    Types: Cigarettes    Quit date: 05/06/1978    Years since quitting: 42.0  . Smokeless tobacco: Never Used  Vaping Use  . Vaping Use: Never used  Substance and Sexual Activity  . Alcohol use: Yes    Comment: occasionally wine and beer  . Drug use: Never  . Sexual activity: Not on file  Other Topics Concern  . Not on file  Social History Narrative   he patient lives in [place] with his spouse.  Retired.   Social Determinants of Health   Financial Resource Strain: Not on file  Food Insecurity: Not on file  Transportation Needs: Not on file  Physical Activity: Not on file  Stress: Not on file  Social Connections: Not on file    His Allergies Are:  Allergies  Allergen Reactions  . Penicillins Itching, Swelling and Other  (See Comments)    Did it involve swelling of the face/tongue/throat, SOB, or low BP? No Did it involve sudden or severe rash/hives, skin peeling, or any reaction on the inside of your mouth or nose? No Did you need to seek medical attention at a hospital or doctor's office? No When did it last happen?30 + years If all above answers are "NO", may proceed with cephalosporin use.    :   His Current Medications Are:  Outpatient Encounter Medications as of 05/10/2020  Medication Sig  . acetaminophen (TYLENOL) 500 MG tablet Take 1,000-1,500 mg by mouth 2 (two) times daily as needed for moderate pain.   Marland Kitchen atorvastatin (LIPITOR) 10 MG tablet Take 1 tablet (10 mg total) by mouth daily.  . Carboxymethylcellulose Sodium (THERATEARS OP) Apply 1 drop to eye daily as needed (dry eyes).  . Cholecalciferol (VITAMIN D3) 5000 UNITS TABS Take 5,000 Units by mouth See admin instructions. Every 4 days  . clindamycin (CLEOCIN T) 1 % external solution Apply 1 application topically daily as needed (for bumps in scalp). Apply to scalp  . Cranberry 1000 MG CAPS Take 2,000-3,000 mg by mouth See admin instructions. Take 20000 mg in the morning and 3000 mg in the evening  . Docusate Calcium (STOOL SOFTENER PO) Take 2 tablets by mouth at bedtime.   . finasteride (PROSCAR) 5 MG tablet Take 5 mg by mouth every morning.   Marland Kitchen HUMIRA PEN 40 MG/0.4ML PNKT Inject 40 mg as directed every 7 (seven) days. Every Tuesday  . lisinopril (ZESTRIL) 10 MG tablet TAKE 1 TABLET BY MOUTH EVERY DAY (Patient taking differently: Take 10 mg by mouth daily.)  . metoprolol succinate (TOPROL-XL) 50 MG 24 hr tablet Taking 1/2 tablet in the am and 1 tablet by mouth in the evening  . metoprolol tartrate (LOPRESSOR) 25 MG tablet TAKE 1 TABLET BY MOUTH EVERY 6 HOURS AS NEEDED FOR FAST HEART RATES (Patient taking differently: Take 25 mg by mouth every 6 (six) hours as needed (FOR FAST HEART RATES).)  . Multiple Vitamins-Minerals (PRESERVISION AREDS  2) CAPS Take 1 capsule by mouth 2 (two)  times a day.  . Omega-3 Fatty Acids (OMEGA 3 PO) Take 3 capsules by mouth every evening.  Marland Kitchen omeprazole (PRILOSEC) 20 MG capsule Take 20 mg by mouth daily.  . solifenacin (VESICARE) 10 MG tablet Take 10 mg by mouth at bedtime.   . tamsulosin (FLOMAX) 0.4 MG CAPS capsule Take 1 capsule (0.4 mg total) by mouth daily after supper.  . vitamin B-12 (CYANOCOBALAMIN) 500 MCG tablet Take 500 mcg by mouth every evening.   Carlena Hurl 20 MG TABS tablet TAKE 1 TABLET BY MOUTH EVERY DAY IN THE EVENING (Patient taking differently: Take 20 mg by mouth every evening.)   No facility-administered encounter medications on file as of 05/10/2020.  :   Review of Systems:  Out of a complete 14 point review of systems, all are reviewed and negative with the exception of these symptoms as listed below:  Review of Systems  Neurological:       Pt presents today to discuss vertiginous symptoms for the past week. Pt is on a cpap currently. He denies any recent head imaging studies.    Objective:  Neurological Exam  Physical Exam Physical Examination:   Vitals:   05/10/20 0940  BP: (!) 165/91  Pulse: 74   On orthostatic testing: Lying blood pressure and pulse 171/96 with a pulse of 73, sitting 165/91 with a pulse of 74, standing 151/89 with a pulse of 77.  He did have mild and brief lightheadedness upon standing.  No spinning sensation.  General Examination: The patient is a very pleasant 87 y.o. male in no acute distress. He appears well-developed and well-nourished and well groomed.   HEENT: Normocephalic, atraumatic, pupils are equal, round and reactive to light and accommodation. Funduscopic exam is normal with sharp disc margins noted.  He is status post cataract repairs bilaterally.  Extraocular tracking is well preserved, no nystagmus, no vertiginous symptoms or nystagmus upon sudden change in head position.  Hearing is very impaired, he has bilateral hearing aids in  place.  Tympanic membrane is clear on the left with clear ear canal on the left, on the right he has dry cerumen, not impacted.  Tympanic membrane on the right partially visible.  Face is symmetric with normal facial animation and normal facial sensation. Speech is clear with no dysarthria noted. There is no hypophonia. There is no lip, neck/head, jaw or voice tremor. Neck is supple with full range of passive and active motion. There are no carotid bruits on auscultation. Oropharynx exam reveals: moderate mouth dryness, adequate dental hygiene and moderate airway crowding, tongue protrudes centrally and palate elevates symmetrically.  Chest: Clear to auscultation without wheezing, rhonchi or crackles noted.  Heart: S1+S2+0, regular and normal without murmurs, rubs or gallops noted.   Abdomen: Soft, non-tender and non-distended with normal bowel sounds appreciated on auscultation.  Extremities: There is 2+ edema noted in both lower extremities, non-pitting.  Skin: Warm and dry without trophic changes noted.  Musculoskeletal: exam reveals no obvious joint deformities, tenderness or joint swelling or erythema.   Neurologically:  Mental status: The patient is awake, alert and oriented in all 4 spheres. His immediate and remote memory, attention, language skills and fund of knowledge are appropriate. There is no evidence of aphasia, agnosia, apraxia or anomia. Speech is clear with normal prosody and enunciation. Thought process is linear. Mood is normal and affect is normal.  Cranial nerves II - XII are as described above under HEENT exam. In addition: shoulder shrug is normal with equal shoulder  height noted. Motor exam: Normal bulk, strength and tone is noted. There is no drift, tremor or rebound. Romberg is not tested due to safety concerns, reflexes are 1+ in the upper extremities and absent in the lower extremities, possibly due to swelling.  Fine motor skills and coordination: intact with normal  finger taps, normal hand movements, normal rapid alternating patting, normal foot taps and normal foot agility.  Cerebellar testing: No dysmetria or intention tremor. There is no truncal or gait ataxia.  Sensory exam: intact to light touch.  Gait, station and balance: He stands without difficulty but does have brief lightheadedness upon standing, no spinning sensation.  He has no walking aid. No veering to one side is noted. No leaning to one side is noted. Posture is age-appropriate and stance is narrow based. Gait shows no ataxia, no shuffling, has preserved arm swing.  Assessment and Plan:   In summary, Connor [name] is a very pleasant 87 y.o.-year old male with an underlying complex medical history of paroxysmal A. fib, hearing loss, vitamin D deficiency, rheumatoid arthritis, right bundle branch block, obstructive sleep apnea on CPAP, nonischemic cardiomyopathy, hypertension, hyperlipidemia, kidney stones, history of gastric ulcer, DVT, PVCs and borderline obesity, who presents for evaluation of his new onset vertigo of approximately 1 weeks' duration. Symptoms started fairly abruptly, has not had a prior history of vertigo.  Since he does have multiple vascular risk factors, would like to exclude a structural cause of his new symptoms.  Current examination is without any telltale vertiginous findings but he does have orthostatic lightheadedness and a 20 point drop in his systolic blood pressure between lying down and standing, which is significant and he is symptomatic from this.  Of note, triggers for his vertigo may include poor sleep consolidation, chronic sleep deprivation, chronic suboptimal hydration.  He is strongly encouraged to hydrate better with water, change positions slowly.  He has not tried any meclizine but I would be cautious with any medication at this time.  He is not keen on starting any new medication.  I would like to do a brain MRI with and without contrast.  We will do a CMP  to make sure his kidney function is okay prior to proceeding with an MRI.  In addition, I do believe he may benefit from vestibular rehab through PT.  I made a referral in that regard.  He is agreeable to working with PT.  He is advised to make a follow-up appointment with his sleep specialist, Dr. Elsworth Soho for follow-up of his sleep apnea.   So long as his brain MRI is without acute concerns, we we will call him with the results and follow-up in this clinic on an as-needed basis.  I answered all his questions today and he was in agreement.    Thank you very much for allowing me to participate in the care of this nice patient. If I can be of any further assistance to you please do not hesitate to call me at [phone].  Sincerely,   Star Age, MD, PhD

## 2020-05-10 NOTE — Patient Instructions (Addendum)
It was nice to meet you today.  I do believe you have positional vertigo.  Your trigger may be sleep deprivation and dehydration as you are not sleeping very well and you may not be hydrating well enough with water.    Your neurological exam is without any concerning findings thankfully.  Please remember, that vertigo can recur without warning, triggers could be stress, sleep deprivation, dehydration and even taking a bumpy car ride, sometimes an acute illness, even a common (viral) cold. It can last hours or days. Please change positions slowly and always stay well-hydrated. Physical therapy with particular attention to vestibular rehabilitation can be very helpful. While there is no specific medication that helps with vertigo, some people get relief with as needed use of meclizine. Certain medications can exacerbate vertigo.   I would not recommend any new medications in your case.  I would like to refer you to physical therapy at neuro rehab for vestibular rehab. They can work with your positional vertigo and also give you exercises.  If you have not heard about the referral to physical therapy in the next few days, please call our office back so we can investigate the hold-up.  In addition, I would like for you to have a brain MRI with and without contrast.  In preparation for your MRI we need to make sure your kidney function is okay.  I will order one blood test today before you leave.  Your systolic blood pressure dropped by 20 points when you stood up, compared to lying down.  This is generally considered a significant drop in your talk number of your blood pressure.  It may tie in with not always hydrating well enough.  Please change positions slowly, do not turn quickly or bend over suddenly.  Please try to hydrate better with water, 6 to 8 cups of water per day are recommended generally speaking, 8 ounce size each.  Unless you have restrictions in your fluid intake as per cardiology, you should  try to drink at least 6 cups of water.  Please check with your cardiologist if you are unsure as to your fluid intake recommendation from their end.

## 2020-05-11 LAB — COMPREHENSIVE METABOLIC PANEL
ALT: 15 IU/L (ref 0–44)
AST: 14 IU/L (ref 0–40)
Albumin/Globulin Ratio: 2 (ref 1.2–2.2)
Albumin: 4.3 g/dL (ref 3.6–4.6)
Alkaline Phosphatase: 82 IU/L (ref 44–121)
BUN/Creatinine Ratio: 17 (ref 10–24)
BUN: 12 mg/dL (ref 8–27)
Bilirubin Total: 0.8 mg/dL (ref 0.0–1.2)
CO2: 26 mmol/L (ref 20–29)
Calcium: 9 mg/dL (ref 8.6–10.2)
Chloride: 105 mmol/L (ref 96–106)
Creatinine, Ser: 0.7 mg/dL — ABNORMAL LOW (ref 0.76–1.27)
GFR calc Af Amer: 100 mL/min/{1.73_m2} (ref >59)
GFR calc non Af Amer: 87 mL/min/{1.73_m2} (ref >59)
Globulin, Total: 2.2 g/dL (ref 1.5–4.5)
Glucose: 107 mg/dL — ABNORMAL HIGH (ref 65–99)
Potassium: 3.9 mmol/L (ref 3.5–5.2)
Sodium: 145 mmol/L — ABNORMAL HIGH (ref 134–144)
Total Protein: 6.5 g/dL (ref 6.0–8.5)

## 2020-05-17 ENCOUNTER — Other Ambulatory Visit: Payer: Self-pay

## 2020-05-17 ENCOUNTER — Encounter: Payer: Self-pay | Admitting: Physical Therapy

## 2020-05-17 ENCOUNTER — Ambulatory Visit: Payer: Medicare PPO | Attending: Neurology | Admitting: Physical Therapy

## 2020-05-17 DIAGNOSIS — R42 Dizziness and giddiness: Secondary | ICD-10-CM | POA: Insufficient documentation

## 2020-05-17 DIAGNOSIS — R2681 Unsteadiness on feet: Secondary | ICD-10-CM | POA: Insufficient documentation

## 2020-05-17 DIAGNOSIS — R2689 Other abnormalities of gait and mobility: Secondary | ICD-10-CM | POA: Insufficient documentation

## 2020-05-17 DIAGNOSIS — R262 Difficulty in walking, not elsewhere classified: Secondary | ICD-10-CM | POA: Diagnosis present

## 2020-05-17 NOTE — Therapy (Signed)
[facility] [address] [place], [place], [address] Phone: [phone]   Fax:  [phone]  Physical Therapy Evaluation  Patient Details  Name: Connor [name] MRN: [id number] Date of Birth: February 16, 1934 Referring Provider (PT): Huston Foley, MD   Encounter Date: 05/17/2020   PT End of Session - 05/17/20 1557    Visit Number 1    Number of Visits 13    Date for PT Re-Evaluation 06/28/20    Authorization Type [employer]    Progress Note Due on Visit 10    PT Start Time 1610    PT Stop Time 1655    PT Time Calculation (min) 45 min    Activity Tolerance Patient tolerated treatment well    Behavior During Therapy [facility] for tasks assessed/performed           Past Medical History:  Diagnosis Date  . Benign localized prostatic hyperplasia with lower urinary tract symptoms (LUTS)    urologist-- dr Retta Diones  . Chronic anticoagulation    Xarelto for afib  . Chronic constipation   . Chronic cystitis   . Gross hematuria   . History of DVT of lower extremity 06/08/2010   right femoral dvt dx post op total hip 05-08-2010  . History of gastric ulcer    remote  . History of kidney stones    one stone. remote hx  . Hyperlipidemia   . Hypertension    Dx by Dr. Champ Mungo around age  76   . NICM (nonischemic cardiomyopathy) (HCC)    per echo 06-11-2017,  ef 45-50% with diffuse hypokinesis,  G1DD  . OSA on CPAP 2017   compliant with CPAP.  managed by Dr Vassie Loll.  . Osteoarthritis   . Other urethral stricture, male, meatal    chronic;  pt self dilates  . Paroxysmal atrial fibrillation [facility]) cardiologist-- dr Johney Frame   first dx 2009/   a. documented by Dr Otho Perl on office EKG 9/12. b. maintained on Tikosyn and Xarelto. c. s/p DCCV 's.  d.  x3  EP w/ ablation atrial fib last one 12-05-2016  . Prediabetes   . Premature ventricular contraction   . PVC's (premature ventricular contractions)   . RA (rheumatoid arthritis)  [facility])    rheumatologist--- Dr Fontaine No,  treated with Humira  . RBBB (right bundle branch block)    Hx of a fib, ablation Aug 2018  . Rheumatoid arthritis (HCC)   . Vitamin D deficiency   . Wears hearing aid in both ears     Past Surgical History:  Procedure Laterality Date  . ABLATION OF DYSRHYTHMIC FOCUS  08/29/2015  . ATRIAL FIBRILLATION ABLATION  12/05/2016  . ATRIAL FIBRILLATION ABLATION N/A 12/05/2016   Procedure: Atrial Fibrillation Ablation;  Surgeon: Hillis Range, MD;  Location: [facility] INVASIVE CV LAB;  Service: Cardiovascular;  Laterality: N/A;  . ATRIAL FIBRILLATION ABLATION N/A 02/29/2020   Procedure: ATRIAL FIBRILLATION ABLATION;  Surgeon: Hillis Range, MD;  Location: [facility] INVASIVE CV LAB;  Service: Cardiovascular;  Laterality: N/A;  . ATRIAL FLUTTER ABLATION  09/2010   by Fawn Kirk  . BUNIONECTOMY Right 1990s  . CARDIOVERSION N/A 10/28/2012   Procedure: CARDIOVERSION;  Surgeon: Kathleene Hazel, MD;  Location: [facility] OR;  Service: Cardiovascular;  Laterality: N/A;  . CATARACT EXTRACTION W/ INTRAOCULAR LENS  IMPLANT, BILATERAL  2019  . CYSTOSCOPY WITH BIOPSY N/A 09/03/2018   Procedure: CYSTOSCOPY WITH FULGURATION QASUORVIF;  Surgeon: Marcine Matar, MD;  Location: [facility] ORS;  Service:  Urology;  Laterality: N/A;  30 MINS  . ELECTROPHYSIOLOGIC STUDY N/A 08/29/2015   Procedure: Atrial Fibrillation Ablation;  Surgeon: [name], MD;  Location: [place] CV LAB;  Service: Cardiovascular;  Laterality: N/A;  . KNEE SURGERY Bilateral x3   1960s & 1970s   open  . LEFT HEART CATHETERIZATION WITH CORONARY ANGIOGRAM N/A 06/07/2011   Procedure: LEFT HEART CATHETERIZATION WITH CORONARY ANGIOGRAM;  Surgeon: Peter M Martinique, MD;  Location: [facility] CATH LAB;  Service: Cardiovascular;  Laterality: N/A;    Normal coronary arteries,  low normal LVF  . TONSILLECTOMY AND ADENOIDECTOMY  age 24  . TOTAL HIP ARTHROPLASTY Right 06/04/10    dr Noemi Chapel  @[facility]   . TOTAL KNEE ARTHROPLASTY Bilateral right 1995;  left  1997  . TRANSURETHRAL RESECTION OF PROSTATE  06-29-2002   dr humphries  @[facility]   . TRANSURETHRAL RESECTION OF PROSTATE N/A 07/06/2018   Procedure: TRANSURETHRAL RESECTION OF THE PROSTATE (TURP);  Surgeon: Franchot Gallo, MD;  Location: [facility];  Service: Urology;  Laterality: N/A;  45 MINS    There were no vitals filed for this visit.    Subjective Assessment - 05/17/20 1606    Subjective Pt reports vertigo primarily in the morning when he gets up. Pt states it started 3 wks ago. Pt states he felt that he was going to fall. Pt states it felt a little better later that evening but not all the way better. Pt states he went to the doctor and was checked out -- everything seemed okay. Pt states doctor had him doing some motion things while lying down but nothing was triggered. Pt states every morning since that day he's had the vertigo issues which improves throughout the day. Pt is to get an MRI this coming Saturday. Pt states the office took his BP and there were no issues.    Pertinent History paroxysmal A. fib with status post ablation x 3, hearing loss, vitamin D deficiency, rheumatoid arthritis, right bundle branch block, obstructive sleep apnea on CPAP, nonischemic cardiomyopathy, hypertension, hyperlipidemia, kidney stones, history of gastric ulcer, DVT, PVCs and borderline obesity    How long can you sit comfortably? n/a    How long can you stand comfortably? n/a    How long can you walk comfortably? n/a    Patient Stated Goals Improve morning dizziness              [facility] PT Assessment - 05/17/20 0001      Assessment   Medical Diagnosis H81.399 (ICD-10-CM) - Other peripheral vertigo, unspecified ear  H81.399 (ICD-10-CM) - Peripheral positional vertigo, unspecified laterality    Referring Provider (PT) Star Age, MD    Onset Date/Surgical Date --   3 weeks ago   Hand Dominance Right    Prior Therapy PT for neck pain ~20 years ago      Precautions   Precautions  Fall      Balance Screen   Has the patient fallen in the past 6 months No    Has the patient had a decrease in activity level because of a fear of falling?  No    Is the patient reluctant to leave their home because of a fear of falling?  No      Home Environment   Living Environment Private residence    Living Arrangements Spouse/significant other    Available Help at Discharge [place] to enter    Entrance Stairs-Number of Steps 2  Home Layout One level    Home Equipment None      Prior Function   Level of Independence Independent    Vocation Retired    Lawyer, fishing, play golf, yard work      Observation/Other Assessments   Focus on Therapeutic Outcomes (FOTO)  --      Ambulation/Gait   Ambulation Distance (Feet) [place] device None    Gait Pattern Step-through pattern    Ambulation Surface Level;Indoor    Gait velocity 9.72 sec = 3.37 ft/sec      High Level Balance   High Level Balance Comments mCTSIB score 107: 30 sec EO firm, 30 sec EC firm, 30 sec EO on foam, 17 sec EC on foam      Functional Gait  Assessment   Gait assessed  Yes    Gait Level Surface Walks 20 ft in less than 7 sec but greater than 5.5 sec, uses assistive device, slower speed, mild gait deviations, or deviates 6-10 in outside of the 12 in walkway width.    Change in Gait Speed Able to smoothly change walking speed without loss of balance or gait deviation. Deviate no more than 6 in outside of the 12 in walkway width.    Gait with Horizontal Head Turns Performs head turns smoothly with slight change in gait velocity (eg, minor disruption to smooth gait path), deviates 6-10 in outside 12 in walkway width, or uses an assistive device.    Gait with Vertical Head Turns Performs task with slight change in gait velocity (eg, minor disruption to smooth gait path), deviates 6 - 10 in outside 12 in walkway width or uses assistive device    Gait and Pivot Turn  Pivot turns safely within 3 sec and stops quickly with no loss of balance.    Step Over Obstacle Is able to step over one shoe box (4.5 in total height) without changing gait speed. No evidence of imbalance.    Gait with Narrow Base of Support Ambulates 4-7 steps.    Gait with Eyes Closed Walks 20 ft, uses assistive device, slower speed, mild gait deviations, deviates 6-10 in outside 12 in walkway width. Ambulates 20 ft in less than 9 sec but greater than 7 sec.    Ambulating Backwards Walks 20 ft, uses assistive device, slower speed, mild gait deviations, deviates 6-10 in outside 12 in walkway width.    Steps Alternating feet, must use rail.   Limited due to chronic knee pain   Total Score 21                  Vestibular Assessment - 05/17/20 0001      Symptom Behavior   Type of Dizziness  Unsteady with head/body turns;Imbalance;"World moves"    Frequency of Dizziness Every morning    Duration of Dizziness half an hour to an hour    Symptom Nature Intermittent    Aggravating Factors Mornings;Supine to sit;Sit to stand    Relieving Factors --   Increased time   Progression of Symptoms No change since onset      Oculomotor Exam   Oculomotor Alignment Normal    Ocular ROM wfl    Spontaneous Absent    Gaze-induced  Absent    Head shaking Horizontal Absent    Head Shaking Vertical Absent    Smooth Pursuits Intact    Saccades Slow    Comment HIT: (-) bilat      Vestibulo-Ocular Reflex  VOR 1 Head Only (x 1 viewing) WFL   "maybe a little bit"   VOR 2 Head and Object (x 2 viewing) WFL    VOR to Slow Head Movement Normal   mild increase in symptoms   VOR Cancellation Normal   Mild increase in symptoms     Visual Acuity   Static line 8    Dynamic line 6      Positional Testing   Sidelying Test Sidelying Right;Sidelying Left    Horizontal Canal Testing Horizontal Canal Right;Horizontal Canal Left      Sidelying Right   Sidelying Right Duration 0      Sidelying Left    Sidelying Left Duration 0      Horizontal Canal Right   Horizontal Canal Right Duration 0      Horizontal Canal Left   Horizontal Canal Left Duration 0              Objective measurements completed on examination: See above findings.                 PT Short Term Goals - 05/17/20 1713      PT SHORT TERM GOAL #1   Title independent with initial HEP    Time 3    Period Weeks    Status New    Target Date 06/07/20      PT SHORT TERM GOAL #2   Title Pt will demo improved mCTSIB score to at least 115    Baseline mCTSIB score 107    Time 3    Period Weeks    Status New    Target Date 06/07/20      PT SHORT TERM GOAL #3   Title Pt will report 50% improvement of his morning unsteadiness    Baseline Near falls in the morning    Time 3    Period Weeks    Status New    Target Date 06/07/20             PT Long Term Goals - 05/17/20 1717      PT LONG TERM GOAL #1   Title Pt will be independent with final HEP    Time 6    Period Weeks    Status New    Target Date 06/28/20      PT LONG TERM GOAL #2   Title Pt will have mCTSIB of 120; able to maintain balance in all conditions    Time 6    Period Weeks    Status New    Target Date 06/28/20      PT LONG TERM GOAL #3   Title Pt will have improved FGA score of at least 25/30 to demo decreased risk of falls    Baseline 21/30    Time 6    Period Weeks    Status New    Target Date 06/28/20                  Plan - 05/17/20 1705    Clinical Impression Statement Pt is an 87 y/o M presenting to OPPT due to complaints of morning dizziness. Pt reports increased feelings of imbalance in the morning that improves throughout the day for the last 3 weeks. On assessment, pt found to have decreased vestibular integration with balance (as seen by his mCTSIB), decreased gaze stabilization, and decreased dynamic gait (FGA score of 21/30 with high risk of falls). Pt is otherwise (-) for BPPV testing of all  canals. Pt is to get an MRI this weekend. Pt would benefit from PT to optimize his level of function and stability for safe mobility and decrease risk of falls.    Personal Factors and Comorbidities Age;Comorbidity 1;Comorbidity 2;Comorbidity 3+    Comorbidities paroxysmal A. fib with status post ablation x 3, hearing loss, vitamin D deficiency, rheumatoid arthritis, right bundle branch block, obstructive sleep apnea on CPAP, nonischemic cardiomyopathy, hypertension, hyperlipidemia, kidney stones, history of gastric ulcer, DVT, PVCs, borderline obesity, and history of bilat TKA and R THA.    Examination-Activity Limitations Stand;Sit;Locomotion Level;Transfers    Examination-Participation Restrictions Community Activity    Stability/Clinical Decision Making Evolving/Moderate complexity    Clinical Decision Making Moderate    Rehab Potential Good    PT Frequency 2x / week    PT Duration 6 weeks    PT Treatment/Interventions ADLs/Self Care Home Management;Canalith Repostioning;Electrical Stimulation;Aquatic Therapy;Moist Heat;Gait training;Stair training;Functional mobility training;Therapeutic activities;DME Instruction;Therapeutic exercise;Neuromuscular re-education;Balance training;Patient/family education;Manual techniques;Taping;Vestibular    PT Next Visit Plan Initiate balance/vestibular HEP -- focus on gaze stabilization and vestibular integration with balance. Work on dynamic gait.    Consulted and Agree with Plan of Care Patient           Patient will benefit from skilled therapeutic intervention in order to improve the following deficits and impairments:  Difficulty walking,Dizziness,Decreased activity tolerance,Decreased balance,Decreased mobility,Decreased strength  Visit Diagnosis: Unsteadiness on feet  Dizziness and giddiness  Other abnormalities of gait and mobility  Difficulty in walking, not elsewhere classified     Problem List Patient Active Problem List   Diagnosis  Date Noted  . Secondary hypercoagulable state ([place]) 03/28/2020  . Malignant neoplasm of prostate ([place]) 09/11/2018  . Enlarged prostate with urinary obstruction 07/06/2018  . Gastrocnemius strain, right, initial encounter 12/10/2017  . Baker's cyst of knee, right 12/10/2017  . Obesity 03/24/2017  . Paroxysmal atrial fibrillation ([place]) 12/05/2016  . Near syncope 04/20/2016  . Leg hematoma, left, initial encounter 04/20/2016  . Bleeding   . Ecchymosis   . Left hamstring muscle strain   . Muscle tear   . RBBB 08/30/2015  . Chronic systolic dysfunction of left ventricle 07/11/2014  . OSA (obstructive sleep apnea) 04/07/2013  . Multinodular goiter 01/20/2013  . Cervical spondylosis without myelopathy 01/18/2013  . Atrial fibrillation with RVR ([place]) 10/27/2012  . PAF (paroxysmal atrial fibrillation) ([place]) 05/28/2012  . Left ventricular dysfunction 12/17/2011  . Low back pain 09/11/2011  . Fatigue 05/22/2011  . PVC (premature ventricular contraction) 01/18/2011  . Persistent atrial fibrillation ([place]) 01/12/2011  . Hyperlipidemia 08/02/2010  . Hypertension 08/02/2010  . Osteoarthritis 08/02/2010  . BPH (benign prostatic hyperplasia) 08/02/2010  . GERD (gastroesophageal reflux disease) 08/02/2010  . h/o Atrial flutter [facility]) s/p ablation 2012     [facility] [place] PT, DPT 05/17/2020, 5:22 PM  [place] [address] [place], [place], [address] Phone: [phone]   Fax:  [phone]  Name: Connor [name] MRN: [id number] Date of Birth: 1933-09-10

## 2020-05-20 ENCOUNTER — Other Ambulatory Visit: Payer: Self-pay

## 2020-05-20 ENCOUNTER — Ambulatory Visit
Admission: RE | Admit: 2020-05-20 | Discharge: 2020-05-20 | Disposition: A | Discharge status: Home / Self Care | Payer: Medicare PPO | Source: Ambulatory Visit | Attending: Neurology | Admitting: Neurology

## 2020-05-20 DIAGNOSIS — R42 Dizziness and giddiness: Secondary | ICD-10-CM

## 2020-05-20 DIAGNOSIS — H81399 Other peripheral vertigo, unspecified ear: Secondary | ICD-10-CM | POA: Diagnosis not present

## 2020-05-20 DIAGNOSIS — I951 Orthostatic hypotension: Secondary | ICD-10-CM

## 2020-05-20 MED ORDER — GADOBENATE DIMEGLUMINE 529 MG/ML IV SOLN
20.0000 mL | Freq: Once | INTRAVENOUS | Status: AC | PRN
  Administered 2020-05-20: 20 mL via INTRAVENOUS

## 2020-05-22 ENCOUNTER — Ambulatory Visit: Payer: Self-pay | Admitting: *Deleted

## 2020-05-23 ENCOUNTER — Ambulatory Visit: Payer: Medicare PPO | Admitting: Physical Therapy

## 2020-05-23 ENCOUNTER — Other Ambulatory Visit: Payer: Self-pay

## 2020-05-23 ENCOUNTER — Telehealth: Payer: Self-pay

## 2020-05-23 ENCOUNTER — Encounter: Payer: Self-pay | Admitting: Physical Therapy

## 2020-05-23 DIAGNOSIS — R2689 Other abnormalities of gait and mobility: Secondary | ICD-10-CM | POA: Diagnosis not present

## 2020-05-23 DIAGNOSIS — R262 Difficulty in walking, not elsewhere classified: Secondary | ICD-10-CM

## 2020-05-23 DIAGNOSIS — R2681 Unsteadiness on feet: Secondary | ICD-10-CM | POA: Diagnosis not present

## 2020-05-23 DIAGNOSIS — R42 Dizziness and giddiness: Secondary | ICD-10-CM

## 2020-05-23 NOTE — Therapy (Signed)
[facility] [address] [place], [place], [address] Phone: [phone]   Fax:  [phone]  Physical Therapy Treatment  Patient Details  Name: Connor [name]  MRN: [id number] Date of Birth: [birth year] Referring Provider (PT): Star Age, MD   Encounter Date: 05/23/2020   PT End of Session - 05/23/20 1051    Visit Number 2    Number of Visits 13    Date for PT Re-Evaluation 06/28/20    Authorization Type [employer] -- paper auth requested 05/17/20    Progress Note Due on Visit 10    PT Start Time 1055    PT Stop Time 1140    PT Time Calculation (min) 45 min    Equipment Utilized During Treatment Gait belt    Activity Tolerance Patient tolerated treatment well    Behavior During Therapy [facility] for tasks assessed/performed           Past Medical History:  Diagnosis Date  . Benign localized prostatic hyperplasia with lower urinary tract symptoms (LUTS)    urologist-- dr Diona Fanti  . Chronic anticoagulation    Xarelto for afib  . Chronic constipation   . Chronic cystitis   . Gross hematuria   . History of DVT of lower extremity 06/08/2010   right femoral dvt dx post op total hip 05-08-2010  . History of gastric ulcer    remote  . History of kidney stones    one stone. remote hx  . Hyperlipidemia   . Hypertension    Dx by Dr. Lance Muss around age  89   . NICM (nonischemic cardiomyopathy) ([place])    per echo 06-11-2017,  ef 45-50% with diffuse hypokinesis,  G1DD  . OSA on CPAP 2017   compliant with CPAP.  managed by Dr Elsworth Soho.  . Osteoarthritis   . Other urethral stricture, male, meatal    chronic;  pt self dilates  . Paroxysmal atrial fibrillation [facility]) cardiologist-- dr Rayann Heman   first dx 2009/   a. documented by Dr Barrett Shell on office EKG 9/12. b. maintained on Tikosyn and Xarelto. c. s/p DCCV 's.  d.  x3  EP w/ ablation atrial fib last one 12-05-2016  . Prediabetes   . Premature ventricular  contraction   . PVC's (premature ventricular contractions)   . RA (rheumatoid arthritis) [facility])    rheumatologist--- Dr Page Spiro,  treated with Humira  . RBBB (right bundle branch block)    Hx of a fib, ablation Aug 2018  . Rheumatoid arthritis ([place])   . Vitamin D deficiency   . Wears hearing aid in both ears     Past Surgical History:  Procedure Laterality Date  . ABLATION OF DYSRHYTHMIC FOCUS  08/29/2015  . ATRIAL FIBRILLATION ABLATION  12/05/2016  . ATRIAL FIBRILLATION ABLATION N/A 12/05/2016   Procedure: Atrial Fibrillation Ablation;  Surgeon: [name], MD;  Location: [facility];  Service: Cardiovascular;  Laterality: N/A;  . ATRIAL FIBRILLATION ABLATION N/A 02/29/2020   Procedure: ATRIAL FIBRILLATION ABLATION;  Surgeon: [name], MD;  Location: [place] [facility];  Service: Cardiovascular;  Laterality: N/A;  . ATRIAL FLUTTER ABLATION  09/2010   by Greggory Brandy  . BUNIONECTOMY Right 1990s  . CARDIOVERSION N/A 10/28/2012   Procedure: CARDIOVERSION;  Surgeon: Burnell Blanks, MD;  Location: [facility];  Service: Cardiovascular;  Laterality: N/A;  . CATARACT EXTRACTION W/ INTRAOCULAR LENS  IMPLANT, BILATERAL  2019  . CYSTOSCOPY WITH BIOPSY N/A 09/03/2018   Procedure:  CYSTOSCOPY WITH FULGURATION [id number];  Surgeon: Franchot Gallo, MD;  Location: [facility];  Service: Urology;  Laterality: N/A;  30 MINS  . ELECTROPHYSIOLOGIC STUDY N/A 08/29/2015   Procedure: Atrial Fibrillation Ablation;  Surgeon: [name], MD;  Location: [facility];  Service: Cardiovascular;  Laterality: N/A;  . KNEE SURGERY Bilateral x3   1960s & 1970s   open  . LEFT HEART CATHETERIZATION WITH CORONARY ANGIOGRAM N/A 06/07/2011   Procedure: LEFT HEART CATHETERIZATION WITH CORONARY ANGIOGRAM;  Surgeon: Peter M Martinique, MD;  Location: [facility];  Service: Cardiovascular;  Laterality: N/A;    Normal coronary arteries,  low normal LVF  . TONSILLECTOMY AND ADENOIDECTOMY  age 72  . TOTAL HIP ARTHROPLASTY  Right 06/04/10    dr Noemi Chapel  @[facility]   . TOTAL KNEE ARTHROPLASTY Bilateral right 1995;  left 1997  . TRANSURETHRAL RESECTION OF PROSTATE  06-29-2002   dr humphries  @[facility]   . TRANSURETHRAL RESECTION OF PROSTATE N/A 07/06/2018   Procedure: TRANSURETHRAL RESECTION OF THE PROSTATE (TURP);  Surgeon: Franchot Gallo, MD;  Location: [facility];  Service: Urology;  Laterality: N/A;  45 MINS    There were no vitals filed for this visit.   Subjective Assessment - 05/23/20 1058    Subjective Pt states he got his MRI on Saturday. Pt is waiting for results. Pt reports nothing new or different.    Pertinent History paroxysmal A. fib with status post ablation x 3, hearing loss, vitamin D deficiency, rheumatoid arthritis, right bundle branch block, obstructive sleep apnea on CPAP, nonischemic cardiomyopathy, hypertension, hyperlipidemia, kidney stones, history of gastric ulcer, DVT, PVCs and borderline obesity    How long can you sit comfortably? n/a    How long can you stand comfortably? n/a    How long can you walk comfortably? n/a    Patient Stated Goals Improve morning dizziness    Currently in Pain? No/denies                              Vestibular Treatment/Exercise - 05/23/20 0001      Vestibular Treatment/Exercise   Gaze Exercises X1 Viewing Horizontal;X1 Viewing Vertical;Eye/Head Exercise Horizontal;Eye/Head Exercise Vertical      X1 Viewing Horizontal   Foot Position Feet apart & then together    Reps 2    Comments x30 sec      X1 Viewing Vertical   Foot Position Feet apart & then together    Reps 2    Comments x30 sec      Eye/Head Exercise Horizontal   Foot Position Feet together    Comments x30 sec saccades; x30 sec saccades with head movement      Eye/Head Exercise Vertical   Foot Position Feet together    Comments x30 sec saccades; x30 saccades with head movement              Balance Exercises - 05/23/20 0001      Balance Exercises:  Standing   Standing Eyes Opened Wide (BOA);Foam/compliant surface   head turns x30 sec, head nod x30 sec, trunk rotation x30 sec   Standing Eyes Closed Wide (BOA);Foam/compliant surface;2 reps;30 secs    Gait with Head Turns Forward;Intermittent upper extremity support;Retro;4 reps   in // bars   Tandem Gait Forward;Intermittent upper extremity support;4 reps   in // bars   Retro Gait 4 reps   in // bars   Sidestepping Foam/compliant support;3 reps  Other Standing Exercises On foam: rotate ball L<>R 2x30 sec; ball up/down 2x30 sec; ball circles CW & CCW x30 sec    Other Standing Exercises Comments side stepping on foam + forward tapping on cone bilat               PT Short Term Goals - 05/17/20 1713      PT SHORT TERM GOAL #1   Title independent with initial HEP    Time 3    Period Weeks    Status New    Target Date 06/07/20      PT SHORT TERM GOAL #2   Title Pt will demo improved mCTSIB score to at least 115    Baseline mCTSIB score 107    Time 3    Period Weeks    Status New    Target Date 06/07/20      PT SHORT TERM GOAL #3   Title Pt will report 50% improvement of his morning unsteadiness    Baseline Near falls in the morning    Time 3    Period Weeks    Status New    Target Date 06/07/20             PT Long Term Goals - 05/17/20 1717      PT LONG TERM GOAL #1   Title Pt will be independent with final HEP    Time 6    Period Weeks    Status New    Target Date 06/28/20      PT LONG TERM GOAL #2   Title Pt will have mCTSIB of 120; able to maintain balance in all conditions    Time 6    Period Weeks    Status New    Target Date 06/28/20      PT LONG TERM GOAL #3   Title Pt will have improved FGA score of at least 25/30 to demo decreased risk of falls    Baseline 21/30    Time 6    Period Weeks    Status New    Target Date 06/28/20                 Plan - 05/23/20 1151    Clinical Impression Statement Treatment session focused on  establishing balance and vestibular HEP. Rest of session focused on improving pt's dynamic gait and balance. Pt with multiple LOBs ambulating with head turns. Pt would benefit from continued PT to progress towards his goals.    Personal Factors and Comorbidities Age;Comorbidity 1;Comorbidity 2;Comorbidity 3+    Comorbidities paroxysmal A. fib with status post ablation x 3, hearing loss, vitamin D deficiency, rheumatoid arthritis, right bundle branch block, obstructive sleep apnea on CPAP, nonischemic cardiomyopathy, hypertension, hyperlipidemia, kidney stones, history of gastric ulcer, DVT, PVCs, borderline obesity, and history of bilat TKA and R THA.    Examination-Activity Limitations Stand;Sit;Locomotion Level;Transfers    Examination-Participation Restrictions Community Activity    Stability/Clinical Decision Making Evolving/Moderate complexity    Rehab Potential Good    PT Frequency 2x / week    PT Duration 6 weeks    PT Treatment/Interventions ADLs/Self Care Home Management;Canalith Repostioning;Electrical Stimulation;Aquatic Therapy;Moist Heat;Gait training;Stair training;Functional mobility training;Therapeutic activities;DME Instruction;Therapeutic exercise;Neuromuscular re-education;Balance training;Patient/family education;Manual techniques;Taping;Vestibular    PT Next Visit Plan Initiate balance/vestibular HEP -- focus on gaze stabilization and vestibular integration with balance. Work on dynamic gait.    Consulted and Agree with Plan of Care Patient  Patient will benefit from skilled therapeutic intervention in order to improve the following deficits and impairments:  Difficulty walking,Dizziness,Decreased activity tolerance,Decreased balance,Decreased mobility,Decreased strength  Visit Diagnosis: Unsteadiness on feet  Dizziness and giddiness  Other abnormalities of gait and mobility  Difficulty in walking, not elsewhere classified     Problem List Patient Active  Problem List   Diagnosis Date Noted  . Secondary hypercoagulable state ([place]) 03/28/2020  . Malignant neoplasm of prostate ([place]) 09/11/2018  . Enlarged prostate with urinary obstruction 07/06/2018  . Gastrocnemius strain, right, initial encounter 12/10/2017  . Baker's cyst of knee, right 12/10/2017  . Obesity 03/24/2017  . Paroxysmal atrial fibrillation ([place]) 12/05/2016  . Near syncope 04/20/2016  . Leg hematoma, left, initial encounter 04/20/2016  . Bleeding   . Ecchymosis   . Left hamstring muscle strain   . Muscle tear   . RBBB 08/30/2015  . Chronic systolic dysfunction of left ventricle 07/11/2014  . OSA (obstructive sleep apnea) 04/07/2013  . Multinodular goiter 01/20/2013  . Cervical spondylosis without myelopathy 01/18/2013  . Atrial fibrillation with RVR ([place]) 10/27/2012  . PAF (paroxysmal atrial fibrillation) ([place]) 05/28/2012  . Left ventricular dysfunction 12/17/2011  . Low back pain 09/11/2011  . Fatigue 05/22/2011  . PVC (premature ventricular contraction) 01/18/2011  . Persistent atrial fibrillation ([place]) 01/12/2011  . Hyperlipidemia 08/02/2010  . Hypertension 08/02/2010  . Osteoarthritis 08/02/2010  . BPH (benign prostatic hyperplasia) 08/02/2010  . GERD (gastroesophageal reflux disease) 08/02/2010  . h/o Atrial [facility]) s/p ablation 2012     [facility] Beatrix Shipper [place] PT, DPT 05/23/2020, 11:52 AM  [facility] [address] [place], [place], [address] Phone: [phone]   Fax:  [phone]  Name: Connor [name] MRN: [id number] Date of Birth: 1933-08-25

## 2020-05-23 NOTE — Telephone Encounter (Signed)
I called patient.  I discussed his MRI results and recommendations.  Patient will follow-up with his primary care and with [place] as needed.  Patient verbalized understanding of results.  Patient had no further questions or concerns.

## 2020-05-23 NOTE — Progress Notes (Signed)
Please call patient and advise him that his brain MRI with and without contrast showed no acute findings, but he did have chronic changes.  The brain scan showed an overall normal structure of the brain and moderate volume loss which we call atrophy. There were changes in the deeper structures of the brain, which we call white matter changes or microvascular changes. These were reported as moderate in His case. These are tiny white spots, that occur with time and are seen in a variety of conditions, including with normal aging, chronic hypertension, chronic headaches, especially migraine HAs, chronic diabetes, chronic hyperlipidemia. These are not strokes and no mass or lesion or contrast enhancement was seen which is reassuring.  There was a small area in the right temple area of the brain with evidence of prior minimal hemorrhage meaning old blood products which was deemed unlikely of any clinical significance.  Not sure what could have caused this, maybe prior trauma, high blood pressure, there are many possibilities, but it does not look recent or acute.    Again, there were no acute findings, such as a stroke, or mass or recent blood products. No further action is required on this test at this time, other than re-enforcing the importance of good blood pressure control, good cholesterol control, good blood sugar control, and weight management.  As discussed during our appointment, I can see him back in clinic on an as-needed basis.  Star Age, MD, PhD

## 2020-05-23 NOTE — Telephone Encounter (Signed)
-----   Message from Star Age, MD sent at 05/23/2020  9:27 AM EST ----- Please call patient and advise him that his brain MRI with and without contrast showed no acute findings, but he did have chronic changes.  The brain scan showed an overall normal structure of the brain and moderate volume loss which we call atrophy. There were changes in the deeper structures of the brain, which we call white matter changes or microvascular changes. These were reported as moderate in His case. These are tiny white spots, that occur with time and are seen in a variety of conditions, including with normal aging, chronic hypertension, chronic headaches, especially migraine HAs, chronic diabetes, chronic hyperlipidemia. These are not strokes and no mass or lesion or contrast enhancement was seen which is reassuring.  There was a small area in the right temple area of the brain with evidence of prior minimal hemorrhage meaning old blood products which was deemed unlikely of any clinical significance.  Not sure what could have caused this, maybe prior trauma, high blood pressure, there are many possibilities, but it does not look recent or acute.    Again, there were no acute findings, such as a stroke, or mass or recent blood products. No further action is required on this test at this time, other than re-enforcing the importance of good blood pressure control, good cholesterol control, good blood sugar control, and weight management.  As discussed during our appointment, I can see him back in clinic on an as-needed basis.  Star Age, MD, PhD

## 2020-05-25 ENCOUNTER — Ambulatory Visit: Payer: Medicare PPO | Admitting: Physical Therapy

## 2020-05-25 ENCOUNTER — Other Ambulatory Visit: Payer: Self-pay

## 2020-05-25 DIAGNOSIS — R42 Dizziness and giddiness: Secondary | ICD-10-CM | POA: Diagnosis not present

## 2020-05-25 DIAGNOSIS — R262 Difficulty in walking, not elsewhere classified: Secondary | ICD-10-CM

## 2020-05-25 DIAGNOSIS — R2689 Other abnormalities of gait and mobility: Secondary | ICD-10-CM

## 2020-05-25 DIAGNOSIS — R2681 Unsteadiness on feet: Secondary | ICD-10-CM

## 2020-05-25 NOTE — Therapy (Signed)
[facility] [address] [place], [place], [address] Phone: [phone]   Fax:  [phone]  Physical Therapy Treatment  Patient Details  Name: Connor [name] MRN: [id number] Date of Birth: August 19, 1933 Referring Provider (PT): Star Age, MD   Encounter Date: 05/25/2020   PT End of Session - 05/25/20 1159    Visit Number 3    Number of Visits 13    Date for PT Re-Evaluation 06/28/20    Authorization Type [employer] -- paper auth requested 05/17/20    Progress Note Due on Visit 10    PT Start Time 1100    PT Stop Time 1145    PT Time Calculation (min) 45 min    Equipment Utilized During Treatment Gait belt    Activity Tolerance Patient tolerated treatment well    Behavior During Therapy [facility] for tasks assessed/performed           Past Medical History:  Diagnosis Date  . Benign localized prostatic hyperplasia with lower urinary tract symptoms (LUTS)    urologist-- dr Diona Fanti  . Chronic anticoagulation    Xarelto for afib  . Chronic constipation   . Chronic cystitis   . Gross hematuria   . History of DVT of lower extremity 06/08/2010   right femoral dvt dx post op total hip 05-08-2010  . History of gastric ulcer    remote  . History of kidney stones    one stone. remote hx  . Hyperlipidemia   . Hypertension    Dx by Dr. Lance Muss around age  89   . NICM (nonischemic cardiomyopathy) ([place])    per echo 06-11-2017,  ef 45-50% with diffuse hypokinesis,  G1DD  . OSA on CPAP 2017   compliant with CPAP.  managed by Dr Elsworth Soho.  . Osteoarthritis   . Other urethral stricture, male, meatal    chronic;  pt self dilates  . Paroxysmal atrial fibrillation [facility]) cardiologist-- dr Rayann Heman   first dx 2009/   a. documented by Dr Barrett Shell on office EKG 9/12. b. maintained on Tikosyn and Xarelto. c. s/p DCCV 's.  d.  x3  EP w/ ablation atrial fib last one 12-05-2016  . Prediabetes   . Premature ventricular  contraction   . PVC's (premature ventricular contractions)   . RA (rheumatoid arthritis) [facility])    rheumatologist--- Dr Page Spiro,  treated with Humira  . RBBB (right bundle branch block)    Hx of a fib, ablation Aug 2018  . Rheumatoid arthritis ([place])   . Vitamin D deficiency   . Wears hearing aid in both ears     Past Surgical History:  Procedure Laterality Date  . ABLATION OF DYSRHYTHMIC FOCUS  08/29/2015  . ATRIAL FIBRILLATION ABLATION  12/05/2016  . ATRIAL FIBRILLATION ABLATION N/A 12/05/2016   Procedure: Atrial Fibrillation Ablation;  Surgeon: [name], MD;  Location: [facility] CV LAB;  Service: Cardiovascular;  Laterality: N/A;  . ATRIAL FIBRILLATION ABLATION N/A 02/29/2020   Procedure: ATRIAL FIBRILLATION ABLATION;  Surgeon: [name], MD;  Location: [place] CV LAB;  Service: Cardiovascular;  Laterality: N/A;  . ATRIAL FLUTTER ABLATION  09/2010   by Greggory Brandy  . BUNIONECTOMY Right 1990s  . CARDIOVERSION N/A 10/28/2012   Procedure: CARDIOVERSION;  Surgeon: Burnell Blanks, MD;  Location: [place];  Service: Cardiovascular;  Laterality: N/A;  . CATARACT EXTRACTION W/ INTRAOCULAR LENS  IMPLANT, BILATERAL  2019  . CYSTOSCOPY WITH BIOPSY N/A 09/03/2018   Procedure: CYSTOSCOPY  WITH FULGURATION OFURETHRA;  Surgeon: Marcine Matar, MD;  Location: [facility];  Service: Urology;  Laterality: N/A;  30 MINS  . ELECTROPHYSIOLOGIC STUDY N/A 08/29/2015   Procedure: Atrial Fibrillation Ablation;  Surgeon: Hillis Range, MD;  Location: [facility] INVASIVE CV LAB;  Service: Cardiovascular;  Laterality: N/A;  . KNEE SURGERY Bilateral x3   1960s & 1970s   open  . LEFT HEART CATHETERIZATION WITH CORONARY ANGIOGRAM N/A 06/07/2011   Procedure: LEFT HEART CATHETERIZATION WITH CORONARY ANGIOGRAM;  Surgeon: Peter M Swaziland, MD;  Location: [facility] CATH LAB;  Service: Cardiovascular;  Laterality: N/A;    Normal coronary arteries,  low normal LVF  . TONSILLECTOMY AND ADENOIDECTOMY  age 47  . TOTAL HIP ARTHROPLASTY  Right 06/04/10    dr Thurston Hole  @[facility]   . TOTAL KNEE ARTHROPLASTY Bilateral right 1995;  left 1997  . TRANSURETHRAL RESECTION OF PROSTATE  06-29-2002   dr humphries  @[facility]   . TRANSURETHRAL RESECTION OF PROSTATE N/A 07/06/2018   Procedure: TRANSURETHRAL RESECTION OF THE PROSTATE (TURP);  Surgeon: Marcine Matar, MD;  Location: [facility];  Service: Urology;  Laterality: N/A;  45 MINS    There were no vitals filed for this visit.   Subjective Assessment - 05/25/20 1103    Subjective Pt states he felt that his imbalance was a little worse this morning. Pt states he was called about MRI results and there was nothing acute.    Pertinent History paroxysmal A. fib with status post ablation x 3, hearing loss, vitamin D deficiency, rheumatoid arthritis, right bundle branch block, obstructive sleep apnea on CPAP, nonischemic cardiomyopathy, hypertension, hyperlipidemia, kidney stones, history of gastric ulcer, DVT, PVCs and borderline obesity    How long can you sit comfortably? n/a    How long can you stand comfortably? n/a    How long can you walk comfortably? n/a    Patient Stated Goals Improve morning dizziness    Currently in Pain? No/denies                              Vestibular Treatment/Exercise - 05/25/20 0001      X1 Viewing Horizontal   Foot Position Feet together on solid ground & then on foam    Comments x30 sec each      X1 Viewing Vertical   Foot Position Feet together on foam    Reps 2    Comments x30 sec      Eye/Head Exercise Horizontal   Foot Position Feet together on foam    Comments x30 sec saccades; x30 sec turning to look at a target in front and behind bilat      Eye/Head Exercise Vertical   Foot Position Feet together on foam    Comments x30 sec saccades; 2x30 sec saccades + head movement with 2 targets up and down              Balance Exercises - 05/25/20 0001      Balance Exercises: Standing   Standing Eyes Closed Wide  (BOA);Foam/compliant surface;2 reps;30 secs;Head turns    Gait with Head Turns Forward;3 reps   x40'   Retro Gait 2 reps   x40'   Marching Solid surface;Static;Head turns   2x30 sec with head nods & then head turns (more challeneged with up/down)   Other Standing Exercises sit<>stand eyes closed x10    Other Standing Exercises Comments Forward gait with eyes closed 3x40'  PT Education - 05/25/20 1155    Education Details Discussed updated HEP. Discussed how decreased visual input and more compliant surfaces can affect/challenge balance system    Person(s) Educated Patient    Methods Explanation;Handout    Comprehension Verbalized understanding            PT Short Term Goals - 05/17/20 1713      PT SHORT TERM GOAL #1   Title independent with initial HEP    Time 3    Period Weeks    Status New    Target Date 06/07/20      PT SHORT TERM GOAL #2   Title Pt will demo improved mCTSIB score to at least 115    Baseline mCTSIB score 107    Time 3    Period Weeks    Status New    Target Date 06/07/20      PT SHORT TERM GOAL #3   Title Pt will report 50% improvement of his morning unsteadiness    Baseline Near falls in the morning    Time 3    Period Weeks    Status New    Target Date 06/07/20             PT Long Term Goals - 05/17/20 1717      PT LONG TERM GOAL #1   Title Pt will be independent with final HEP    Time 6    Period Weeks    Status New    Target Date 06/28/20      PT LONG TERM GOAL #2   Title Pt will have mCTSIB of 120; able to maintain balance in all conditions    Time 6    Period Weeks    Status New    Target Date 06/28/20      PT LONG TERM GOAL #3   Title Pt will have improved FGA score of at least 25/30 to demo decreased risk of falls    Baseline 21/30    Time 6    Period Weeks    Status New    Target Date 06/28/20                 Plan - 05/25/20 1157    Clinical Impression Statement Today's treatment focused on  continuing to challenge pt's vestibular and balance system. Pt challenged with vision removed and with head turns. Pt with improving dynamic gait from previous session. Pt demos good compliance to his HEP.    Personal Factors and Comorbidities Age;Comorbidity 1;Comorbidity 2;Comorbidity 3+    Comorbidities paroxysmal A. fib with status post ablation x 3, hearing loss, vitamin D deficiency, rheumatoid arthritis, right bundle branch block, obstructive sleep apnea on CPAP, nonischemic cardiomyopathy, hypertension, hyperlipidemia, kidney stones, history of gastric ulcer, DVT, PVCs, borderline obesity, and history of bilat TKA and R THA.    Examination-Activity Limitations Stand;Sit;Locomotion Level;Transfers    Examination-Participation Restrictions Community Activity    Stability/Clinical Decision Making Evolving/Moderate complexity    Rehab Potential Good    PT Frequency 2x / week    PT Duration 6 weeks    PT Treatment/Interventions ADLs/Self Care Home Management;Canalith Repostioning;Electrical Stimulation;Aquatic Therapy;Moist Heat;Gait training;Stair training;Functional mobility training;Therapeutic activities;DME Instruction;Therapeutic exercise;Neuromuscular re-education;Balance training;Patient/family education;Manual techniques;Taping;Vestibular    PT Next Visit Plan Continue to focus on gaze stabilization, balance with vision removed on compliant surfaces, and vestibular integration. Work on dynamic gait and weight shifting (consider walking on foam).    PT Home Exercise Plan Access Code: [email or web address]  Consulted and Agree with Plan of Care Patient           Patient will benefit from skilled therapeutic intervention in order to improve the following deficits and impairments:  Difficulty walking,Dizziness,Decreased activity tolerance,Decreased balance,Decreased mobility,Decreased strength  Visit Diagnosis: Unsteadiness on feet  Dizziness and giddiness  Other abnormalities of gait and  mobility  Difficulty in walking, not elsewhere classified     Problem List Patient Active Problem List   Diagnosis Date Noted  . Secondary hypercoagulable state ([place]) 03/28/2020  . Malignant neoplasm of prostate ([place]) 09/11/2018  . Enlarged prostate with urinary obstruction 07/06/2018  . Gastrocnemius strain, right, initial encounter 12/10/2017  . Baker's cyst of knee, right 12/10/2017  . Obesity 03/24/2017  . Paroxysmal atrial fibrillation ([place]) 12/05/2016  . Near syncope 04/20/2016  . Leg hematoma, left, initial encounter 04/20/2016  . Bleeding   . Ecchymosis   . Left hamstring muscle strain   . Muscle tear   . RBBB 08/30/2015  . Chronic systolic dysfunction of left ventricle 07/11/2014  . OSA (obstructive sleep apnea) 04/07/2013  . Multinodular goiter 01/20/2013  . Cervical spondylosis without myelopathy 01/18/2013  . Atrial fibrillation with RVR ([place]) 10/27/2012  . PAF (paroxysmal atrial fibrillation) ([place]) 05/28/2012  . Left ventricular dysfunction 12/17/2011  . Low back pain 09/11/2011  . Fatigue 05/22/2011  . PVC (premature ventricular contraction) 01/18/2011  . Persistent atrial fibrillation ([place]) 01/12/2011  . Hyperlipidemia 08/02/2010  . Hypertension 08/02/2010  . Osteoarthritis 08/02/2010  . BPH (benign prostatic hyperplasia) 08/02/2010  . GERD (gastroesophageal reflux disease) 08/02/2010  . h/o Atrial flutter [facility]) s/p ablation 2012     Janis Sol April Beatrix Shipper [place] PT, DPT 05/25/2020, 12:00 PM  [facility] [address] [place], [place], [address] Phone: [phone]   Fax:  [phone]  Name: Connor [name] MRN: [id number] Date of Birth: 05-09-1933

## 2020-05-25 NOTE — Patient Instructions (Signed)
Access Code: [email or web address] URL: [email or web address] Date: 05/25/2020 Prepared by: Estill Bamberg April Thurnell Garbe  Exercises Standing Gaze Stabilization with Head Rotation - 1 x daily - 7 x weekly - 2 sets - 30 sec hold Standing Gaze Stabilization with Head Nod - 1 x daily - 7 x weekly - 2 sets - 30 sec hold Standing on Foam Gaze Stabilization with Two Near Targets and Head Rotation - 1 x daily - 7 x weekly - 1 sets - 30 sec hold Standing on Foam Gaze Stabilization with Two Near Targets and Head Nod - 1 x daily - 7 x weekly - 1 sets - 30 sec hold Standing Balance with Eyes Closed on Foam - 1 x daily - 7 x weekly - 2 sets - 30 sec hold Narrow Stance with Eyes Closed and Head Rotation on Foam Pad - 1 x daily - 7 x weekly - 30 sec hold Narrow Stance with Eyes Closed and Head Nods on Foam Pad - 1 x daily - 7 x weekly - 30 sec hold Walking with Head Rotation - 1 x daily - 7 x weekly - 3 sets - 10 reps

## 2020-05-28 NOTE — Progress Notes (Signed)
[facility] [address] [place], [place] [address] [phone]  Date:  05/31/2020   Name:  Connor [name]   DOB:  1934/04/25   MRN:  [id number]  PCP:  Darreld Mclean, MD    Chief Complaint: 6 mont follow up and Dizziness   History of Present Illness:  Connor [name] is a 87 y.o. very pleasant male patient who presents with the following:  Here today for a follow up visit .  Gentleman with history of atrial fibrillation, left ventricle dysfunction, prostate cancer, seronegative rheumatoid arthritis and osteoarthritis He is taking his xarelto for a fib,which is intermittent Last seen by myself for vertigo on 12/29 We then had him see neurology for this issue- they recommended an MRI to exclude any structural cause and then rehab    MR brain 1/15:  IMPRESSION: This MRI of the brain with and without contrast shows the following: 1.   T2/flair hyperintense foci in the pons and hemispheres most consistent with moderate chronic microvascular ischemic change.  None of the foci appear to be acute. 2.   Generalized cortical atrophy most pronounced in the medial temporal lobes. 3.   No acute findings.  Normal enhancement pattern.  He is doing PT twice a week and is also doing home exercises - he is a bit better as far as his vertigo now  He is trying to walk more for exercise   He also notes difficulty with his hearing aids.  He got a new set of hearing aids from [place] not too long ago, but notes they are not working out well.  He is still having quite a bit of difficulty with his hearing  We note that his blood pressure is high, it has been elevated on his last couple of visits-he is taking lisinopril 10 mg  BP Readings from Last 3 Encounters:  05/31/20 (!) 168/98  05/10/20 (!) 165/91  03/28/20 (!) 156/94    Patient Active Problem List   Diagnosis Date Noted  . Secondary hypercoagulable state ([place])  03/28/2020  . Malignant neoplasm of prostate ([place]) 09/11/2018  . Enlarged prostate with urinary obstruction 07/06/2018  . Gastrocnemius strain, right, initial encounter 12/10/2017  . Baker's cyst of knee, right 12/10/2017  . Obesity 03/24/2017  . Paroxysmal atrial fibrillation ([place]) 12/05/2016  . Near syncope 04/20/2016  . Leg hematoma, left, initial encounter 04/20/2016  . Bleeding   . Ecchymosis   . Left hamstring muscle strain   . Muscle tear   . RBBB 08/30/2015  . Chronic systolic dysfunction of left ventricle 07/11/2014  . OSA (obstructive sleep apnea) 04/07/2013  . Multinodular goiter 01/20/2013  . Cervical spondylosis without myelopathy 01/18/2013  . Atrial fibrillation with RVR ([place]) 10/27/2012  . PAF (paroxysmal atrial fibrillation) ([place]) 05/28/2012  . Left ventricular dysfunction 12/17/2011  . Low back pain 09/11/2011  . Fatigue 05/22/2011  . PVC (premature ventricular contraction) 01/18/2011  . Persistent atrial fibrillation ([place]) 01/12/2011  . Hyperlipidemia 08/02/2010  . Hypertension 08/02/2010  . Osteoarthritis 08/02/2010  . BPH (benign prostatic hyperplasia) 08/02/2010  . GERD (gastroesophageal reflux disease) 08/02/2010  . h/o Atrial flutter ([place]) s/p ablation 2012     Past Medical History:  Diagnosis Date  . Benign localized prostatic hyperplasia with lower urinary tract symptoms (LUTS)    urologist-- dr Diona Fanti  . Chronic anticoagulation    Xarelto for afib  . Chronic constipation   .  Chronic cystitis   . Gross hematuria   . History of DVT of lower extremity 06/08/2010   right femoral dvt dx post op total hip 05-08-2010  . History of gastric ulcer    remote  . History of kidney stones    one stone. remote hx  . Hyperlipidemia   . Hypertension    Dx by Dr. Lance Muss around age  70   . NICM (nonischemic cardiomyopathy) ([place])    per echo 06-11-2017,  ef 45-50% with diffuse hypokinesis,  G1DD  . OSA on CPAP 2017   compliant with CPAP.  managed by  Dr Elsworth Soho.  . Osteoarthritis   . Other urethral stricture, male, meatal    chronic;  pt self dilates  . Paroxysmal atrial fibrillation [facility]) cardiologist-- dr Rayann Heman   first dx 2009/   a. documented by Dr Barrett Shell on office EKG 9/12. b. maintained on Tikosyn and Xarelto. c. s/p DCCV 's.  d.  x3  EP w/ ablation atrial fib last one 12-05-2016  . Prediabetes   . Premature ventricular contraction   . PVC's (premature ventricular contractions)   . RA (rheumatoid arthritis) [facility])    rheumatologist--- Dr Page Spiro,  treated with Humira  . RBBB (right bundle branch block)    Hx of a fib, ablation Aug 2018  . Rheumatoid arthritis ([place])   . Vitamin D deficiency   . Wears hearing aid in both ears     Past Surgical History:  Procedure Laterality Date  . ABLATION OF DYSRHYTHMIC FOCUS  08/29/2015  . ATRIAL FIBRILLATION ABLATION  12/05/2016  . ATRIAL FIBRILLATION ABLATION N/A 12/05/2016   Procedure: Atrial Fibrillation Ablation;  Surgeon: [name], MD;  Location: [place] CV LAB;  Service: Cardiovascular;  Laterality: N/A;  . ATRIAL FIBRILLATION ABLATION N/A 02/29/2020   Procedure: ATRIAL FIBRILLATION ABLATION;  Surgeon: [name], MD;  Location: [facility] CV LAB;  Service: Cardiovascular;  Laterality: N/A;  . ATRIAL FLUTTER ABLATION  09/2010   by Greggory Brandy  . BUNIONECTOMY Right 1990s  . CARDIOVERSION N/A 10/28/2012   Procedure: CARDIOVERSION;  Surgeon: Burnell Blanks, MD;  Location: [facility];  Service: Cardiovascular;  Laterality: N/A;  . CATARACT EXTRACTION W/ INTRAOCULAR LENS  IMPLANT, BILATERAL  2019  . CYSTOSCOPY WITH BIOPSY N/A 09/03/2018   Procedure: CYSTOSCOPY WITH FULGURATION [id number];  Surgeon: Franchot Gallo, MD;  [facility];  Service: Urology;  Laterality: N/A;  30 MINS  . ELECTROPHYSIOLOGIC STUDY N/A 08/29/2015   Procedure: Atrial Fibrillation Ablation;  Surgeon: [name], MD;  Location: [place] CV LAB;  Service: Cardiovascular;  Laterality: N/A;  . KNEE  SURGERY Bilateral x3   1960s & 1970s   open  . LEFT HEART CATHETERIZATION WITH CORONARY ANGIOGRAM N/A 06/07/2011   Procedure: LEFT HEART CATHETERIZATION WITH CORONARY ANGIOGRAM;  Surgeon: Peter M Martinique, MD;  Location: [facility] CATH LAB;  Service: Cardiovascular;  Laterality: N/A;    Normal coronary arteries,  low normal LVF  . TONSILLECTOMY AND ADENOIDECTOMY  age 3  . TOTAL HIP ARTHROPLASTY Right 06/04/10    dr Noemi Chapel  @[facility]   . TOTAL KNEE ARTHROPLASTY Bilateral right 1995;  left 1997  . TRANSURETHRAL RESECTION OF PROSTATE  06-29-2002   dr humphries  @[facility]   . TRANSURETHRAL RESECTION OF PROSTATE N/A 07/06/2018   Procedure: TRANSURETHRAL RESECTION OF THE PROSTATE (TURP);  Surgeon: Franchot Gallo, MD;  Location: [facility];  Service: Urology;  Laterality: N/A;  33 MINS    Social History   Tobacco Use  .  Smoking status: Former Smoker    Packs/day: 1.00    Years: 30.00    Pack years: 30.00    Types: Cigarettes    Quit date: 05/06/1978    Years since quitting: 42.0  . Smokeless tobacco: Never Used  Vaping Use  . Vaping Use: Never used  Substance Use Topics  . Alcohol use: Yes    Comment: occasionally wine and beer  . Drug use: Never    Family History  Problem Relation Age of Onset  . Heart attack Father   . Lung disease Mother   . Arrhythmia Sister   . Atrial fibrillation Brother   . Diabetes Neg Hx     Allergies  Allergen Reactions  . Penicillins Itching, Swelling and Other (See Comments)    Did it involve swelling of the face/tongue/throat, SOB, or low BP? No Did it involve sudden or severe rash/hives, skin peeling, or any reaction on the inside of your mouth or nose? No Did you need to seek medical attention at a hospital or doctor's office? No When did it last happen?30 + years If all above answers are "NO", may proceed with cephalosporin use.      Medication list has been reviewed and updated.  Current Outpatient Medications on File Prior to Visit   Medication Sig Dispense Refill  . acetaminophen (TYLENOL) 500 MG tablet Take 1,000-1,500 mg by mouth 2 (two) times daily as needed for moderate pain.     Marland Kitchen atorvastatin (LIPITOR) 10 MG tablet Take 1 tablet (10 mg total) by mouth daily. 90 tablet 3  . Carboxymethylcellulose Sodium (THERATEARS OP) Apply 1 drop to eye daily as needed (dry eyes).    . Cholecalciferol (VITAMIN D3) 5000 UNITS TABS Take 5,000 Units by mouth See admin instructions. Every 4 days    . clindamycin (CLEOCIN T) 1 % external solution Apply 1 application topically daily as needed (for bumps in scalp). Apply to scalp  2  . Cranberry 1000 MG CAPS Take 2,000-3,000 mg by mouth See admin instructions. Take 20000 mg in the morning and 3000 mg in the evening    . Docusate Calcium (STOOL SOFTENER PO) Take 2 tablets by mouth at bedtime.     . finasteride (PROSCAR) 5 MG tablet Take 5 mg by mouth every morning.   3  . HUMIRA PEN 40 MG/0.4ML PNKT Inject 40 mg as directed every 7 (seven) days. Every Tuesday  6  . metoprolol succinate (TOPROL-XL) 50 MG 24 hr tablet Taking 1/2 tablet in the am and 1 tablet by mouth in the evening    . metoprolol tartrate (LOPRESSOR) 25 MG tablet TAKE 1 TABLET BY MOUTH EVERY 6 HOURS AS NEEDED FOR FAST HEART RATES (Patient taking differently: Take 25 mg by mouth every 6 (six) hours as needed (FOR FAST HEART RATES).) 90 tablet 2  . Multiple Vitamins-Minerals (PRESERVISION AREDS 2) CAPS Take 1 capsule by mouth 2 (two) times a day.    . Omega-3 Fatty Acids (OMEGA 3 PO) Take 3 capsules by mouth every evening.    Marland Kitchen omeprazole (PRILOSEC) 20 MG capsule Take 20 mg by mouth daily.    . solifenacin (VESICARE) 10 MG tablet Take 10 mg by mouth at bedtime.     . tamsulosin (FLOMAX) 0.4 MG CAPS capsule Take 1 capsule (0.4 mg total) by mouth daily after supper. 30 capsule 5  . vitamin B-12 (CYANOCOBALAMIN) 500 MCG tablet Take 500 mcg by mouth every evening.     Alveda Reasons 20 MG TABS tablet  TAKE 1 TABLET BY MOUTH EVERY DAY IN  THE EVENING (Patient taking differently: Take 20 mg by mouth every evening.) 90 tablet 1   No current facility-administered medications on file prior to visit.    Review of Systems:  As per HPI- otherwise negative.   Physical Examination: Vitals:   05/31/20 1122  BP: (!) 168/98  Pulse: (!) 59  Resp: 17  SpO2: 97%   Vitals:   05/31/20 1122  Weight: 223 lb (101.2 kg)  Height: 6' (1.829 m)   Body mass index is 30.24 kg/m. Ideal Body Weight: Weight in (lb) to have BMI = 25: 183.9  GEN: no acute distress.  Overweight, looks well and his normal self  HEENT: Atraumatic, Normocephalic.  Both ear canals are widely patent and free of debris Ears and Nose: No external deformity. CV: RRR, No M/G/R. No JVD. No thrill. No extra heart sounds. PULM: CTA B, no wheezes, crackles, rhonchi. No retractions. No resp. distress. No accessory muscle use. ABD: S, NT, ND, +BS. No rebound. No HSM. EXTR: No c/c/e PSYCH: Normally interactive. Conversant.    Assessment and Plan: Essential hypertension - Plan: lisinopril (ZESTRIL) 20 MG tablet  Mixed hyperlipidemia - Plan: lisinopril (ZESTRIL) 20 MG tablet  Bilateral hearing loss, unspecified hearing loss type  Following up today BP a bit high- will increase his lisinopril to 20 mg  I have asked him to monitor his blood pressures at home, let me know how he responds to the 20 mg.  We discussed his hearing difficulty.  Advised him that an audiologist is probably his best bet for help with the situation.  He has an audiologist in [place] whom he is work with before, he plans to give them a call This visit occurred during the SARS-CoV-2 public health emergency.  Safety protocols were in place, including screening questions prior to the visit, additional usage of staff PPE, and extensive cleaning of exam room while observing appropriate contact time as indicated for disinfecting solutions.    Signed Lamar Blinks, MD

## 2020-05-29 ENCOUNTER — Ambulatory Visit: Payer: Medicare PPO | Admitting: Internal Medicine

## 2020-05-30 ENCOUNTER — Ambulatory Visit: Payer: Medicare PPO | Admitting: Physical Therapy

## 2020-05-30 ENCOUNTER — Other Ambulatory Visit: Payer: Self-pay

## 2020-05-30 DIAGNOSIS — R42 Dizziness and giddiness: Secondary | ICD-10-CM

## 2020-05-30 DIAGNOSIS — R2681 Unsteadiness on feet: Secondary | ICD-10-CM

## 2020-05-30 DIAGNOSIS — R2689 Other abnormalities of gait and mobility: Secondary | ICD-10-CM

## 2020-05-30 DIAGNOSIS — R262 Difficulty in walking, not elsewhere classified: Secondary | ICD-10-CM | POA: Diagnosis not present

## 2020-05-31 ENCOUNTER — Encounter: Payer: Self-pay | Admitting: Family Medicine

## 2020-05-31 ENCOUNTER — Ambulatory Visit: Payer: Medicare PPO | Admitting: Family Medicine

## 2020-05-31 VITALS — BP 168/98 | HR 59 | Resp 17 | Ht 72.0 in | Wt 223.0 lb

## 2020-05-31 DIAGNOSIS — I10 Essential (primary) hypertension: Secondary | ICD-10-CM | POA: Diagnosis not present

## 2020-05-31 DIAGNOSIS — E782 Mixed hyperlipidemia: Secondary | ICD-10-CM | POA: Diagnosis not present

## 2020-05-31 DIAGNOSIS — H9193 Unspecified hearing loss, bilateral: Secondary | ICD-10-CM

## 2020-05-31 MED ORDER — LISINOPRIL 20 MG PO TABS: 20.0000 mg | ORAL_TABLET | Freq: Every day | ORAL | 3 refills | Status: DC

## 2020-05-31 NOTE — Patient Instructions (Signed)
It was good to see you again today-we will increase your lisinopril to 20 mg daily to help bring down your BP Please let me now if this causes any side effects or problems I would recommend seeing your audiologist to discuss your hearing aids situation

## 2020-05-31 NOTE — Therapy (Signed)
[facility] [address] [place], [place], [address] Phone: [phone]   Fax:  [phone]  Physical Therapy Treatment  Patient Details  Name: Connor [name] MRN: [id number] Date of Birth: [birth year] Referring Provider (PT): Star Age, MD   Encounter Date: 05/30/2020   PT End of Session - 05/31/20 1756    Visit Number 4    Number of Visits 13    Date for PT Re-Evaluation 06/28/20    Authorization Type [employer] -- paper auth requested 05/17/20    Progress Note Due on Visit 10    PT Start Time 1101    PT Stop Time 1145    PT Time Calculation (min) 44 min    Equipment Utilized During Treatment Gait belt    Activity Tolerance Patient tolerated treatment well    Behavior During Therapy [facility] for tasks assessed/performed           Past Medical History:  Diagnosis Date  . Benign localized prostatic hyperplasia with lower urinary tract symptoms (LUTS)    urologist-- dr Diona Fanti  . Chronic anticoagulation    Xarelto for afib  . Chronic constipation   . Chronic cystitis   . Gross hematuria   . History of DVT of lower extremity 06/08/2010   right femoral dvt dx post op total hip 05-08-2010  . History of gastric ulcer    remote  . History of kidney stones    one stone. remote hx  . Hyperlipidemia   . Hypertension    Dx by Dr. Lance Muss around age  87   . NICM (nonischemic cardiomyopathy) ([place])    per echo 06-11-2017,  ef 45-50% with diffuse hypokinesis,  G1DD  . OSA on CPAP 2017   compliant with CPAP.  managed by Dr Elsworth Soho.  . Osteoarthritis   . Other urethral stricture, male, meatal    chronic;  pt self dilates  . Paroxysmal atrial fibrillation [facility]) cardiologist-- dr Rayann Heman   first dx 2009/   a. documented by Dr Barrett Shell on office EKG 9/12. b. maintained on Tikosyn and Xarelto. c. s/p DCCV 's.  d.  x3  EP w/ ablation atrial fib last one 12-05-2016  . Prediabetes   . Premature ventricular  contraction   . PVC's (premature ventricular contractions)   . RA (rheumatoid arthritis) [facility])    rheumatologist--- Dr Page Spiro,  treated with Humira  . RBBB (right bundle branch block)    Hx of a fib, ablation Aug 2018  . Rheumatoid arthritis ([place])   . Vitamin D deficiency   . Wears hearing aid in both ears     Past Surgical History:  Procedure Laterality Date  . ABLATION OF DYSRHYTHMIC FOCUS  08/29/2015  . ATRIAL FIBRILLATION ABLATION  12/05/2016  . ATRIAL FIBRILLATION ABLATION N/A 12/05/2016   Procedure: Atrial Fibrillation Ablation;  Surgeon: [name], MD;  Location: [place] CV LAB;  Service: Cardiovascular;  Laterality: N/A;  . ATRIAL FIBRILLATION ABLATION N/A 02/29/2020   Procedure: ATRIAL FIBRILLATION ABLATION;  Surgeon: [name], MD;  Location: [place] CV LAB;  Service: Cardiovascular;  Laterality: N/A;  . ATRIAL FLUTTER ABLATION  09/2010   by Greggory Brandy  . BUNIONECTOMY Right 1990s  . CARDIOVERSION N/A 10/28/2012   Procedure: CARDIOVERSION;  Surgeon: Burnell Blanks, MD;  Location: [place];  Service: Cardiovascular;  Laterality: N/A;  . CATARACT EXTRACTION W/ INTRAOCULAR LENS  IMPLANT, BILATERAL  2019  . CYSTOSCOPY WITH BIOPSY N/A 09/03/2018   Procedure: CYSTOSCOPY  WITH FULGURATION OFURETHRA;  Surgeon: Franchot Gallo, MD;  Location: [facility];  Service: Urology;  Laterality: N/A;  30 MINS  . ELECTROPHYSIOLOGIC STUDY N/A 08/29/2015   Procedure: Atrial Fibrillation Ablation;  Surgeon: [name], MD;  Location: [place] CV LAB;  Service: Cardiovascular;  Laterality: N/A;  . KNEE SURGERY Bilateral x3   1960s & 1970s   open  . LEFT HEART CATHETERIZATION WITH CORONARY ANGIOGRAM N/A 06/07/2011   Procedure: LEFT HEART CATHETERIZATION WITH CORONARY ANGIOGRAM;  Surgeon: Peter M Martinique, MD;  Location: [facility] CATH LAB;  Service: Cardiovascular;  Laterality: N/A;    Normal coronary arteries,  low normal LVF  . TONSILLECTOMY AND ADENOIDECTOMY  age 87  . TOTAL HIP ARTHROPLASTY  Right 06/04/10    dr Noemi Chapel  @[facility]   . TOTAL KNEE ARTHROPLASTY Bilateral right 1995;  left 1997  . TRANSURETHRAL RESECTION OF PROSTATE  06-29-2002   dr humphries  @[facility]   . TRANSURETHRAL RESECTION OF PROSTATE N/A 07/06/2018   Procedure: TRANSURETHRAL RESECTION OF THE PROSTATE (TURP);  Surgeon: Franchot Gallo, MD;  Location: [facility];  Service: Urology;  Laterality: N/A;  45 MINS    There were no vitals filed for this visit.   Subjective Assessment - 05/30/20 1103    Subjective Pt states he went to the dentist yesterday at 11:00 and he had episode after getting up from lying down; states if he gets up early in the morning he is dizzy but if he gets up later it is not as bad    Patient Stated Goals Improve morning dizziness    Currently in Pain? No/denies                                  Balance Exercises - 05/31/20 0001      Balance Exercises: Standing   Standing Eyes Opened Narrow base of support (BOS);Wide (BOA);Head turns;Foam/compliant surface;5 reps   horizontal & vertical head turns   Standing Eyes Closed Narrow base of support (BOS);Wide (BOA);Head turns;Foam/compliant surface;5 reps   horizontal and vertical head turns   Rockerboard Anterior/posterior;EO;EC;10 reps    Gait with Head Turns Forward;2 reps   30'   Retro Gait 1 rep   30' - solid surface   Step Over Hurdles / Cones 10 reps each leg - stepping over balance beam inside // bars    Marching Foam/compliant surface;Static;10 reps    Other Standing Exercises amb. 24' with abrupt stop and turn with CGA for safety - mild unsteadiness noted               PT Short Term Goals - 05/31/20 1818      PT SHORT TERM GOAL #1   Title independent with initial HEP    Time 3    Period Weeks    Status New    Target Date 06/07/20      PT SHORT TERM GOAL #2   Title Pt will demo improved mCTSIB score to at least 115    Baseline mCTSIB score 107    Time 3    Period Weeks    Status New     Target Date 06/07/20      PT SHORT TERM GOAL #3   Title Pt will report 50% improvement of his morning unsteadiness    Baseline Near falls in the morning    Time 3    Period Weeks    Status New  Target Date 06/07/20             PT Long Term Goals - 05/31/20 1818      PT LONG TERM GOAL #1   Title Pt will be independent with final HEP    Time 6    Period Weeks    Status New      PT LONG TERM GOAL #2   Title Pt will have mCTSIB of 120; able to maintain balance in all conditions    Time 6    Period Weeks    Status New      PT LONG TERM GOAL #3   Title Pt will have improved FGA score of at least 25/30 to demo decreased risk of falls    Baseline 21/30    Time 6    Period Weeks    Status New                 Plan - 05/31/20 1811    Clinical Impression Statement Skilled PT session focused on balance activities with increased vestibular input required.  Pt demonstrates mild unsteadiness with amb. with head turns and also with standing activities with EC.  Cont with POC.    Personal Factors and Comorbidities Age;Comorbidity 1;Comorbidity 2;Comorbidity 3+    Comorbidities paroxysmal A. fib with status post ablation x 3, hearing loss, vitamin D deficiency, rheumatoid arthritis, right bundle branch block, obstructive sleep apnea on CPAP, nonischemic cardiomyopathy, hypertension, hyperlipidemia, kidney stones, history of gastric ulcer, DVT, PVCs, borderline obesity, and history of bilat TKA and R THA.    Examination-Activity Limitations Stand;Sit;Locomotion Level;Transfers    Examination-Participation Restrictions Community Activity    Stability/Clinical Decision Making Evolving/Moderate complexity    Rehab Potential Good    PT Frequency 2x / week    PT Duration 6 weeks    PT Treatment/Interventions ADLs/Self Care Home Management;Canalith Repostioning;Electrical Stimulation;Aquatic Therapy;Moist Heat;Gait training;Stair training;Functional mobility training;Therapeutic  activities;DME Instruction;Therapeutic exercise;Neuromuscular re-education;Balance training;Patient/family education;Manual techniques;Taping;Vestibular    PT Next Visit Plan Continue to focus on gaze stabilization, balance with vision removed on compliant surfaces, and vestibular integration. Work on dynamic gait and weight shifting (consider walking on foam).    PT Home Exercise Plan Access Code: [email or web address]    Consulted and Agree with Plan of Care Patient           Patient will benefit from skilled therapeutic intervention in order to improve the following deficits and impairments:  Difficulty walking,Dizziness,Decreased activity tolerance,Decreased balance,Decreased mobility,Decreased strength  Visit Diagnosis: Unsteadiness on feet  Dizziness and giddiness  Other abnormalities of gait and mobility     Problem List Patient Active Problem List   Diagnosis Date Noted  . Secondary hypercoagulable state ([place]) 03/28/2020  . Malignant neoplasm of prostate ([place]) 09/11/2018  . Enlarged prostate with urinary obstruction 07/06/2018  . Gastrocnemius strain, right, initial encounter 12/10/2017  . Baker's cyst of knee, right 12/10/2017  . Obesity 03/24/2017  . Paroxysmal atrial fibrillation ([place]) 12/05/2016  . Near syncope 04/20/2016  . Leg hematoma, left, initial encounter 04/20/2016  . Bleeding   . Ecchymosis   . Left hamstring muscle strain   . Muscle tear   . RBBB 08/30/2015  . Chronic systolic dysfunction of left ventricle 07/11/2014  . OSA (obstructive sleep apnea) 04/07/2013  . Multinodular goiter 01/20/2013  . Cervical spondylosis without myelopathy 01/18/2013  . Atrial fibrillation with RVR ([place]) 10/27/2012  . PAF (paroxysmal atrial fibrillation) ([place]) 05/28/2012  . Left ventricular dysfunction 12/17/2011  . Low back pain 09/11/2011  .  Fatigue 05/22/2011  . PVC (premature ventricular contraction) 01/18/2011  . Persistent atrial fibrillation ([place]) 01/12/2011  .  Hyperlipidemia 08/02/2010  . Hypertension 08/02/2010  . Osteoarthritis 08/02/2010  . BPH (benign prostatic hyperplasia) 08/02/2010  . GERD (gastroesophageal reflux disease) 08/02/2010  . h/o Atrial flutter [facility]) s/p ablation 2012     Anwar Sakata Suzanne, PT 05/31/2020, 6:19 PM  [facility] [address] [place], [place], [address] Phone: [phone]   Fax:  [phone]  Name: EH SESAY MRN: [id number] Date of Birth: 1933-10-02

## 2020-06-01 ENCOUNTER — Other Ambulatory Visit: Payer: Self-pay

## 2020-06-01 ENCOUNTER — Encounter: Payer: Self-pay | Admitting: Physical Therapy

## 2020-06-01 ENCOUNTER — Ambulatory Visit: Payer: Medicare PPO | Admitting: Physical Therapy

## 2020-06-01 DIAGNOSIS — R2689 Other abnormalities of gait and mobility: Secondary | ICD-10-CM | POA: Diagnosis not present

## 2020-06-01 DIAGNOSIS — R2681 Unsteadiness on feet: Secondary | ICD-10-CM | POA: Diagnosis not present

## 2020-06-01 DIAGNOSIS — R42 Dizziness and giddiness: Secondary | ICD-10-CM | POA: Diagnosis not present

## 2020-06-01 DIAGNOSIS — R262 Difficulty in walking, not elsewhere classified: Secondary | ICD-10-CM | POA: Diagnosis not present

## 2020-06-01 DIAGNOSIS — H353132 Nonexudative age-related macular degeneration, bilateral, intermediate dry stage: Secondary | ICD-10-CM | POA: Diagnosis not present

## 2020-06-02 NOTE — Therapy (Signed)
[facility] [address] [place], [place], [address] Phone: [phone]   Fax:  [phone]  Physical Therapy Treatment  Patient Details  Name: Connor [name] MRN: [id number] Date of Birth: May 12, 1933 Referring Provider (PT): Star Age, MD   Encounter Date: 06/01/2020   PT End of Session - 06/02/20 1238    Visit Number 5    Number of Visits 13    Date for PT Re-Evaluation 06/28/20    Authorization Type [employer] -- paper Josem Kaufmann requested 05/17/20; 05-17-20 - 06-28-20    Authorization - Visit Number 5    Authorization - Number of Visits 12    Progress Note Due on Visit 10    PT Start Time 1020    PT Stop Time 1103    PT Time Calculation (min) 43 min    Equipment Utilized During Treatment --    Activity Tolerance Patient tolerated treatment well    Behavior During Therapy [facility] for tasks assessed/performed           Past Medical History:  Diagnosis Date  . Benign localized prostatic hyperplasia with lower urinary tract symptoms (LUTS)    urologist-- dr Diona Fanti  . Chronic anticoagulation    Xarelto for afib  . Chronic constipation   . Chronic cystitis   . Gross hematuria   . History of DVT of lower extremity 06/08/2010   right femoral dvt dx post op total hip 05-08-2010  . History of gastric ulcer    remote  . History of kidney stones    one stone. remote hx  . Hyperlipidemia   . Hypertension    Dx by Dr. Lance Muss around age  80   . NICM (nonischemic cardiomyopathy) ([place])    per echo 06-11-2017,  ef 45-50% with diffuse hypokinesis,  G1DD  . OSA on CPAP 2017   compliant with CPAP.  managed by Dr Elsworth Soho.  . Osteoarthritis   . Other urethral stricture, male, meatal    chronic;  pt self dilates  . Paroxysmal atrial fibrillation [facility]) cardiologist-- dr Rayann Heman   first dx 2009/   a. documented by Dr Barrett Shell on office EKG 9/12. b. maintained on Tikosyn and Xarelto. c. s/p DCCV 's.  d.  x3  EP w/  ablation atrial fib last one 12-05-2016  . Prediabetes   . Premature ventricular contraction   . PVC's (premature ventricular contractions)   . RA (rheumatoid arthritis) [facility])    rheumatologist--- Dr Page Spiro,  treated with Humira  . RBBB (right bundle branch block)    Hx of a fib, ablation Aug 2018  . Rheumatoid arthritis ([place])   . Vitamin D deficiency   . Wears hearing aid in both ears     Past Surgical History:  Procedure Laterality Date  . ABLATION OF DYSRHYTHMIC FOCUS  08/29/2015  . ATRIAL FIBRILLATION ABLATION  12/05/2016  . ATRIAL FIBRILLATION ABLATION N/A 12/05/2016   Procedure: Atrial Fibrillation Ablation;  Surgeon: [name], MD;  Location: [place] CV LAB;  Service: Cardiovascular;  Laterality: N/A;  . ATRIAL FIBRILLATION ABLATION N/A 02/29/2020   Procedure: ATRIAL FIBRILLATION ABLATION;  Surgeon: [name], MD;  Location: [place] CV LAB;  Service: Cardiovascular;  Laterality: N/A;  . ATRIAL FLUTTER ABLATION  09/2010   by Greggory Brandy  . BUNIONECTOMY Right 1990s  . CARDIOVERSION N/A 10/28/2012   Procedure: CARDIOVERSION;  Surgeon: Burnell Blanks, MD;  Location: [place];  Service: Cardiovascular;  Laterality: N/A;  . CATARACT EXTRACTION  W/ INTRAOCULAR LENS  IMPLANT, BILATERAL  2019  . CYSTOSCOPY WITH BIOPSY N/A 09/03/2018   Procedure: CYSTOSCOPY WITH FULGURATION UXNATFTDD;  Surgeon: Franchot Gallo, MD;  Location: [facility];  Service: Urology;  Laterality: N/A;  30 MINS  . ELECTROPHYSIOLOGIC STUDY N/A 08/29/2015   Procedure: Atrial Fibrillation Ablation;  Surgeon: [name], MD;  Location: [place] CV LAB;  Service: Cardiovascular;  Laterality: N/A;  . KNEE SURGERY Bilateral x3   1960s & 1970s   open  . LEFT HEART CATHETERIZATION WITH CORONARY ANGIOGRAM N/A 06/07/2011   Procedure: LEFT HEART CATHETERIZATION WITH CORONARY ANGIOGRAM;  Surgeon: Peter M Martinique, MD;  Location: [facility] CATH LAB;  Service: Cardiovascular;  Laterality: N/A;    Normal coronary arteries,   low normal LVF  . TONSILLECTOMY AND ADENOIDECTOMY  age 66  . TOTAL HIP ARTHROPLASTY Right 06/04/10    dr Noemi Chapel  @[facility]   . TOTAL KNEE ARTHROPLASTY Bilateral right 1995;  left 1997  . TRANSURETHRAL RESECTION OF PROSTATE  06-29-2002   dr humphries  @[facility]   . TRANSURETHRAL RESECTION OF PROSTATE N/A 07/06/2018   Procedure: TRANSURETHRAL RESECTION OF THE PROSTATE (TURP);  Surgeon: Franchot Gallo, MD;  Location: [facility];  Service: Urology;  Laterality: N/A;  45 MINS    There were no vitals filed for this visit.   Subjective Assessment - 06/01/20 1014    Subjective Pt states he turned on light when he got up at 5am and it seemed to make a difference in his ability to maintain balance better    Patient Stated Goals Improve morning dizziness    Currently in Pain? No/denies                             [facility] Adult PT Treatment/Exercise - 06/02/20 0001      Ambulation/Gait   Ambulation/Gait Yes    Ambulation Distance (Feet) 200 Feet    Assistive device None    Gait Pattern Within Functional Limits    Ambulation Surface Level;Indoor    Gait Comments pt amb. with horizontal and vertical head turns with SBA - no major LOB occurred with this activity               Balance Exercises - 06/02/20 0001      Balance Exercises: Standing   Rockerboard Anterior/posterior;EO;EC;10 reps;UE support;Head turns;Lateral    Gait with Head Turns Forward;2 reps   30'   Step Over Hurdles / Cones 5 reps stepping over and back of blue balance beam with UE support on // bars prn    Marching Foam/compliant surface;Static;10 reps    Other Standing Exercises sit to stand 5 reps with EC - feet on Airex - CGA for safety    Other Standing Exercises Comments pt performed marching on incline - EO 5 reps; EO with horizontal head turns and 5 reps with vertical head turns; EC 5 reps no head turns, then 5 reps horizontal and 5 reps vertical head turns with min to mod assist                PT Short Term Goals - 06/02/20 1243      PT SHORT TERM GOAL #1   Title independent with initial HEP    Time 3    Period Weeks    Status New    Target Date 06/07/20      PT SHORT TERM GOAL #2   Title Pt will demo improved mCTSIB score  to at least 115    Baseline mCTSIB score 107    Time 3    Period Weeks    Status New    Target Date 06/07/20      PT SHORT TERM GOAL #3   Title Pt will report 50% improvement of his morning unsteadiness    Baseline Near falls in the morning    Time 3    Period Weeks    Status New    Target Date 06/07/20             PT Long Term Goals - 06/02/20 1243      PT LONG TERM GOAL #1   Title Pt will be independent with final HEP    Time 6    Period Weeks    Status New      PT LONG TERM GOAL #2   Title Pt will have mCTSIB of 120; able to maintain balance in all conditions    Time 6    Period Weeks    Status New      PT LONG TERM GOAL #3   Title Pt will have improved FGA score of at least 25/30 to demo decreased risk of falls    Baseline 21/30    Time 6    Period Weeks    Status New                 Plan - 06/02/20 1240    Clinical Impression Statement Pt continues to have difficulty maintaining balance with EC with standing on compliant surfaces, indicative of vestibular hypofunction.  Pt's SLS balance appears to be better on LLE than on RLE.  Cont with POC.    Personal Factors and Comorbidities Age;Comorbidity 1;Comorbidity 2;Comorbidity 3+    Comorbidities paroxysmal A. fib with status post ablation x 3, hearing loss, vitamin D deficiency, rheumatoid arthritis, right bundle branch block, obstructive sleep apnea on CPAP, nonischemic cardiomyopathy, hypertension, hyperlipidemia, kidney stones, history of gastric ulcer, DVT, PVCs, borderline obesity, and history of bilat TKA and R THA.    Examination-Activity Limitations Stand;Sit;Locomotion Level;Transfers    Examination-Participation Restrictions Community Activity     Stability/Clinical Decision Making Evolving/Moderate complexity    Rehab Potential Good    PT Frequency 2x / week    PT Duration 6 weeks    PT Treatment/Interventions ADLs/Self Care Home Management;Canalith Repostioning;Electrical Stimulation;Aquatic Therapy;Moist Heat;Gait training;Stair training;Functional mobility training;Therapeutic activities;DME Instruction;Therapeutic exercise;Neuromuscular re-education;Balance training;Patient/family education;Manual techniques;Taping;Vestibular    PT Next Visit Plan Continue to focus on gaze stabilization, balance with vision removed on compliant surfaces, and vestibular integration. Work on dynamic gait and weight shifting (consider walking on foam).    PT Home Exercise Plan Access Code: [email or web address]    Consulted and Agree with Plan of Care Patient           Patient will benefit from skilled therapeutic intervention in order to improve the following deficits and impairments:  Difficulty walking,Dizziness,Decreased activity tolerance,Decreased balance,Decreased mobility,Decreased strength  Visit Diagnosis: Unsteadiness on feet  Other abnormalities of gait and mobility     Problem List Patient Active Problem List   Diagnosis Date Noted  . Secondary hypercoagulable state ([place]) 03/28/2020  . Malignant neoplasm of prostate ([place]) 09/11/2018  . Enlarged prostate with urinary obstruction 07/06/2018  . Gastrocnemius strain, right, initial encounter 12/10/2017  . Baker's cyst of knee, right 12/10/2017  . Obesity 03/24/2017  . Paroxysmal atrial fibrillation ([place]) 12/05/2016  . Near syncope 04/20/2016  . Leg hematoma, left, initial encounter 04/20/2016  .  Bleeding   . Ecchymosis   . Left hamstring muscle strain   . Muscle tear   . RBBB 08/30/2015  . Chronic systolic dysfunction of left ventricle 07/11/2014  . OSA (obstructive sleep apnea) 04/07/2013  . Multinodular goiter 01/20/2013  . Cervical spondylosis without myelopathy 01/18/2013  .  Atrial fibrillation with RVR ([place]) 10/27/2012  . PAF (paroxysmal atrial fibrillation) ([place]) 05/28/2012  . Left ventricular dysfunction 12/17/2011  . Low back pain 09/11/2011  . Fatigue 05/22/2011  . PVC (premature ventricular contraction) 01/18/2011  . Persistent atrial fibrillation ([place]) 01/12/2011  . Hyperlipidemia 08/02/2010  . Hypertension 08/02/2010  . Osteoarthritis 08/02/2010  . BPH (benign prostatic hyperplasia) 08/02/2010  . GERD (gastroesophageal reflux disease) 08/02/2010  . h/o Atrial flutter [facility]) s/p ablation 2012     Angline Schweigert Suzanne, PT 06/02/2020, 12:45 PM  [facility] [address] [place], [place], [address] Phone: [phone]   Fax:  [phone]  Name: Connor [name] MRN: [id number] Date of Birth: 11/08/1933

## 2020-06-03 ENCOUNTER — Other Ambulatory Visit: Payer: Self-pay | Admitting: Internal Medicine

## 2020-06-05 ENCOUNTER — Other Ambulatory Visit: Payer: Self-pay

## 2020-06-05 ENCOUNTER — Encounter: Payer: Self-pay | Admitting: Internal Medicine

## 2020-06-05 ENCOUNTER — Ambulatory Visit: Payer: Medicare PPO | Admitting: Internal Medicine

## 2020-06-05 VITALS — BP 148/88 | HR 80 | Ht 72.0 in | Wt 227.6 lb

## 2020-06-05 DIAGNOSIS — D6869 Other thrombophilia: Secondary | ICD-10-CM | POA: Diagnosis not present

## 2020-06-05 DIAGNOSIS — I4819 Other persistent atrial fibrillation: Secondary | ICD-10-CM | POA: Diagnosis not present

## 2020-06-05 DIAGNOSIS — I10 Essential (primary) hypertension: Secondary | ICD-10-CM | POA: Diagnosis not present

## 2020-06-05 DIAGNOSIS — G4733 Obstructive sleep apnea (adult) (pediatric): Secondary | ICD-10-CM | POA: Diagnosis not present

## 2020-06-05 NOTE — Telephone Encounter (Signed)
Prescription refill request for Xarelto received.   Indication: Afib Last office visit: [place], 03/28/2020 Weight: 101.2 kg  Age: 87 yo  Scr: 0.70, 05/10/2020 CrCl: 112 ml/min   Prescription refill sent.

## 2020-06-05 NOTE — Patient Instructions (Signed)
Medication Instructions:  Your physician recommends that you continue on your current medications as directed. Please refer to the Current Medication list given to you today.  *If you need a refill on your cardiac medications before your next appointment, please call your pharmacy*   Lab Work: None ordered If you have labs (blood work) drawn today and your tests are completely normal, you will receive your results only by: Marland Kitchen MyChart Message (if you have MyChart) OR . A paper copy in the mail If you have any lab test that is abnormal or we need to change your treatment, we will call you to review the results.   Testing/Procedures: None ordered   Follow-Up: At [facility], you and your health needs are our priority.  As part of our continuing mission to provide you with exceptional heart care, we have created designated Provider Care Teams.  These Care Teams include your primary Cardiologist (physician) and Advanced Practice Providers (APPs -  Physician Assistants and Nurse Practitioners) who all work together to provide you with the care you need, when you need it.  We recommend signing up for the patient portal called "MyChart".  Sign up information is provided on this After Visit Summary.  MyChart is used to connect with patients for Virtual Visits (Telemedicine).  Patients are able to view lab/test results, encounter notes, upcoming appointments, etc.  Non-urgent messages can be sent to your provider as well.   To learn more about what you can do with MyChart, go to [email or web address].    Your next appointment:   3 month(s)  The format for your next appointment:   In Person  Provider:   [name], MD    Thank you for choosing [facility]!!  [phone]   Other Instructions

## 2020-06-05 NOTE — Progress Notes (Signed)
PCP: Copland, Gay Filler, MD  Connor [name] is a 87 y.o. male who presents today for routine electrophysiology followup.  Since his recent afib ablation, the patient reports doing very well.  he denies procedure related complications and is pleased with the results of the procedure.  Today, he denies symptoms of palpitations, chest pain, shortness of breath,  lower extremity edema, or syncope.  He has had some vestibular disturbance for which he has seen neurology and is undergoing therapy.  The patient is otherwise without complaint today.   Past Medical History:  Diagnosis Date  . Benign localized prostatic hyperplasia with lower urinary tract symptoms (LUTS)    urologist-- dr Diona Fanti  . Chronic anticoagulation    Xarelto for afib  . Chronic constipation   . Chronic cystitis   . Gross hematuria   . History of DVT of lower extremity 06/08/2010   right femoral dvt dx post op total hip 05-08-2010  . History of gastric ulcer    remote  . History of kidney stones    one stone. remote hx  . Hyperlipidemia   . Hypertension    Dx by Dr. Lance Muss around age  68   . NICM (nonischemic cardiomyopathy) ([facility])    per echo 06-11-2017,  ef 45-50% with diffuse hypokinesis,  G1DD  . OSA on CPAP 2017   compliant with CPAP.  managed by Dr Elsworth Soho.  . Osteoarthritis   . Other urethral stricture, male, meatal    chronic;  pt self dilates  . Paroxysmal atrial fibrillation [facility]) cardiologist-- dr Rayann Heman   first dx 2009/   a. documented by Dr Barrett Shell on office EKG 9/12. b. maintained on Tikosyn and Xarelto. c. s/p DCCV 's.  d.  x3  EP w/ ablation atrial fib last one 12-05-2016  . Prediabetes   . Premature ventricular contraction   . PVC's (premature ventricular contractions)   . RA (rheumatoid arthritis) [facility])    rheumatologist--- Dr Page Spiro,  treated with Humira  . RBBB (right bundle branch block)    Hx of a fib, ablation Aug 2018  . Rheumatoid arthritis ([place])   . Vitamin D deficiency   .  Wears hearing aid in both ears    Past Surgical History:  Procedure Laterality Date  . ABLATION OF DYSRHYTHMIC FOCUS  08/29/2015  . ATRIAL FIBRILLATION ABLATION  12/05/2016  . ATRIAL FIBRILLATION ABLATION N/A 12/05/2016   Procedure: Atrial Fibrillation Ablation;  Surgeon: [name], MD;  Location: [place] CV LAB;  Service: Cardiovascular;  Laterality: N/A;  . ATRIAL FIBRILLATION ABLATION N/A 02/29/2020   Procedure: ATRIAL FIBRILLATION ABLATION;  Surgeon: [name], MD;  Location: [place] CV LAB;  Service: Cardiovascular;  Laterality: N/A;  . ATRIAL FLUTTER ABLATION  09/2010   by Greggory Brandy  . BUNIONECTOMY Right 1990s  . CARDIOVERSION N/A 10/28/2012   Procedure: CARDIOVERSION;  Surgeon: Burnell Blanks, MD;  Location: [place];  Service: Cardiovascular;  Laterality: N/A;  . CATARACT EXTRACTION W/ INTRAOCULAR LENS  IMPLANT, BILATERAL  2019  . CYSTOSCOPY WITH BIOPSY N/A 09/03/2018   Procedure: CYSTOSCOPY WITH FULGURATION [id number];  Surgeon: Franchot Gallo, MD;  Location: [facility];  Service: Urology;  Laterality: N/A;  30 MINS  . ELECTROPHYSIOLOGIC STUDY N/A 08/29/2015   Procedure: Atrial Fibrillation Ablation;  Surgeon: [name], MD;  Location: [place] CV LAB;  Service: Cardiovascular;  Laterality: N/A;  . KNEE SURGERY Bilateral x3   1960s & 1970s   open  . LEFT HEART CATHETERIZATION  WITH CORONARY ANGIOGRAM N/A 06/07/2011   Procedure: LEFT HEART CATHETERIZATION WITH CORONARY ANGIOGRAM;  Surgeon: Peter M Martinique, MD;  Location: [facility] CATH LAB;  Service: Cardiovascular;  Laterality: N/A;    Normal coronary arteries,  low normal LVF  . TONSILLECTOMY AND ADENOIDECTOMY  age 77  . TOTAL HIP ARTHROPLASTY Right 06/04/10    dr Noemi Chapel  @[facility]   . TOTAL KNEE ARTHROPLASTY Bilateral right 1995;  left 1997  . TRANSURETHRAL RESECTION OF PROSTATE  06-29-2002   dr humphries  @[facility]   . TRANSURETHRAL RESECTION OF PROSTATE N/A 07/06/2018   Procedure: TRANSURETHRAL RESECTION OF THE PROSTATE (TURP);  Surgeon:  Franchot Gallo, MD;  Location: [facility];  Service: Urology;  Laterality: N/A;  67 MINS    ROS- all systems are personally reviewed and negatives except as per HPI above  Current Outpatient Medications  Medication Sig Dispense Refill  . acetaminophen (TYLENOL) 500 MG tablet Take 1,000-1,500 mg by mouth 2 (two) times daily as needed for moderate pain.     Marland Kitchen atorvastatin (LIPITOR) 10 MG tablet Take 1 tablet (10 mg total) by mouth daily. 90 tablet 3  . Carboxymethylcellulose Sodium (THERATEARS OP) Apply 1 drop to eye daily as needed (dry eyes).    . Cholecalciferol (VITAMIN D3) 5000 UNITS TABS Take 5,000 Units by mouth See admin instructions. Every 4 days    . clindamycin (CLEOCIN T) 1 % external solution Apply 1 application topically daily as needed (for bumps in scalp). Apply to scalp  2  . Cranberry 1000 MG CAPS Take 2,000-3,000 mg by mouth See admin instructions. Take 20000 mg in the morning and 3000 mg in the evening    . Docusate Calcium (STOOL SOFTENER PO) Take 2 tablets by mouth at bedtime.     . finasteride (PROSCAR) 5 MG tablet Take 5 mg by mouth every morning.   3  . HUMIRA PEN 40 MG/0.4ML PNKT Inject 40 mg as directed every 7 (seven) days. Every Tuesday  6  . lisinopril (ZESTRIL) 20 MG tablet Take 1 tablet (20 mg total) by mouth daily. 90 tablet 3  . metoprolol succinate (TOPROL-XL) 50 MG 24 hr tablet Taking 1/2 tablet in the am and 1 tablet by mouth in the evening    . metoprolol tartrate (LOPRESSOR) 25 MG tablet TAKE 1 TABLET BY MOUTH EVERY 6 HOURS AS NEEDED FOR FAST HEART RATES 90 tablet 2  . Multiple Vitamins-Minerals (PRESERVISION AREDS 2) CAPS Take 1 capsule by mouth 2 (two) times a day.    . Omega-3 Fatty Acids (OMEGA 3 PO) Take 3 capsules by mouth every evening.    Marland Kitchen omeprazole (PRILOSEC) 20 MG capsule Take 20 mg by mouth daily.    . solifenacin (VESICARE) 10 MG tablet Take 10 mg by mouth at bedtime.     . tamsulosin (FLOMAX) 0.4 MG CAPS capsule Take 1  capsule (0.4 mg total) by mouth daily after supper. 30 capsule 5  . vitamin B-12 (CYANOCOBALAMIN) 500 MCG tablet Take 500 mcg by mouth every evening.     Alveda Reasons 20 MG TABS tablet TAKE 1 TABLET BY MOUTH EVERY DAY IN THE EVENING 90 tablet 1   No current facility-administered medications for this visit.    Physical Exam: Vitals:   06/05/20 1135  BP: (!) 148/88  Pulse: 80  SpO2: 96%  Weight: 227 lb 9.6 oz (103.2 kg)  Height: 6' (1.829 m)    GEN- The patient is well appearing, alert and oriented x 3 today.   Head-  normocephalic, atraumatic Eyes-  Sclera clear, conjunctiva pink Ears- hearing intact Oropharynx- clear Lungs- Clear to ausculation bilaterally, normal work of breathing Heart- Regular rate and rhythm, no murmurs, rubs or gallops, PMI not laterally displaced GI- soft, NT, ND, + BS Extremities- no clubbing, cyanosis, or edema  EKG tracing ordered today is personally reviewed and shows  Sinus rhythm , RBBB  Assessment and Plan:  1. Persistent atrial fibrillation Doing well s/p ablation chads2vasc score is 4.  Continue xarelto  2. OSA Compliance with CPAP advised  3. HTN Stable No change required today   Return to see me in 3 months  [name] MD, [facility] 06/05/2020 11:55 AM

## 2020-06-06 ENCOUNTER — Ambulatory Visit: Payer: Medicare PPO | Attending: Neurology | Admitting: Physical Therapy

## 2020-06-06 DIAGNOSIS — R42 Dizziness and giddiness: Secondary | ICD-10-CM | POA: Diagnosis not present

## 2020-06-06 DIAGNOSIS — R262 Difficulty in walking, not elsewhere classified: Secondary | ICD-10-CM | POA: Diagnosis not present

## 2020-06-06 DIAGNOSIS — R2689 Other abnormalities of gait and mobility: Secondary | ICD-10-CM | POA: Diagnosis not present

## 2020-06-06 DIAGNOSIS — R2681 Unsteadiness on feet: Secondary | ICD-10-CM | POA: Diagnosis not present

## 2020-06-07 NOTE — Progress Notes (Signed)
Subjective:   Connor [name] is a 87 y.o. male who presents for Medicare Annual/Subsequent preventive examination.  I connected with [place] today by telephone and verified that I am speaking with the correct person using two identifiers. Location patient: home Location provider: work Persons participating in the virtual visit: patient, Marine scientist.    I discussed the limitations, risks, security and privacy concerns of performing an evaluation and management service by telephone and the availability of in person appointments. I also discussed with the patient that there may be a patient responsible charge related to this service. The patient expressed understanding and verbally consented to this telephonic visit.    Interactive audio and video telecommunications were attempted between this provider and patient, however failed, due to patient having technical difficulties OR patient did not have access to video capability.  We continued and completed visit with audio only.  Some vital signs may be absent or patient reported.   Time Spent with patient on telephone encounter: 30 minutes  Review of Systems     Cardiac Risk Factors include: advanced age (>28men, >43 women);hypertension;dyslipidemia;male gender     Objective:    Today's Vitals   06/08/20 1023  Weight: 227 lb (103 kg)  Height: 6' (1.829 m)   Body mass index is 30.79 kg/m.  Advanced Directives 06/08/2020 05/17/2020 02/29/2020 05/20/2019 02/11/2019 12/01/2018 09/03/2018  Does Patient Have a Medical Advance Directive? Yes Yes Yes Yes Yes No Yes  Type of Advance Directive Living will [place];Living will [place];Living will [place];Living will Living will - Living will;Healthcare Power of Attorney  Does patient want to make changes to medical advance directive? - - No - Patient declined No - Patient declined - - No - Patient declined  Copy of Healthcare Power of Attorney  in Chart? - - No - copy requested No - copy requested No - copy requested - No - copy requested  Would patient like information on creating a medical advance directive? - - - - - No - Patient declined -  Pre-existing out of facility DNR order (yellow form or pink MOST form) - - - - - - -    Current Medications (verified) Outpatient Encounter Medications as of 06/08/2020  Medication Sig  . acetaminophen (TYLENOL) 500 MG tablet Take 1,000-1,500 mg by mouth 2 (two) times daily as needed for moderate pain.   Marland Kitchen atorvastatin (LIPITOR) 10 MG tablet Take 1 tablet (10 mg total) by mouth daily.  . Carboxymethylcellulose Sodium (THERATEARS OP) Apply 1 drop to eye daily as needed (dry eyes).  . Cholecalciferol (VITAMIN D3) 5000 UNITS TABS Take 5,000 Units by mouth See admin instructions. Every 4 days  . clindamycin (CLEOCIN T) 1 % external solution Apply 1 application topically daily as needed (for bumps in scalp). Apply to scalp  . Cranberry 1000 MG CAPS Take 2,000-3,000 mg by mouth See admin instructions. Take 20000 mg in the morning and 3000 mg in the evening  . Docusate Calcium (STOOL SOFTENER PO) Take 2 tablets by mouth at bedtime.   . finasteride (PROSCAR) 5 MG tablet Take 5 mg by mouth every morning.   Marland Kitchen HUMIRA PEN 40 MG/0.4ML PNKT Inject 40 mg as directed every 7 (seven) days. Every Tuesday  . lisinopril (ZESTRIL) 20 MG tablet Take 1 tablet (20 mg total) by mouth daily.  . metoprolol succinate (TOPROL-XL) 50 MG 24 hr tablet Taking 1/2 tablet in the am and 1 tablet by mouth in  the evening  . metoprolol tartrate (LOPRESSOR) 25 MG tablet TAKE 1 TABLET BY MOUTH EVERY 6 HOURS AS NEEDED FOR FAST HEART RATES  . Multiple Vitamins-Minerals (PRESERVISION AREDS 2) CAPS Take 1 capsule by mouth 2 (two) times a day.  . Omega-3 Fatty Acids (OMEGA 3 PO) Take 3 capsules by mouth every evening.  Marland Kitchen omeprazole (PRILOSEC) 20 MG capsule Take 20 mg by mouth daily.  . solifenacin (VESICARE) 10 MG tablet Take 10 mg by mouth  at bedtime.   . tamsulosin (FLOMAX) 0.4 MG CAPS capsule Take 1 capsule (0.4 mg total) by mouth daily after supper.  . vitamin B-12 (CYANOCOBALAMIN) 500 MCG tablet Take 500 mcg by mouth every evening.   Alveda Reasons 20 MG TABS tablet TAKE 1 TABLET BY MOUTH EVERY DAY IN THE EVENING   No facility-administered encounter medications on file as of 06/08/2020.    Allergies (verified) Penicillins   History: Past Medical History:  Diagnosis Date  . Benign localized prostatic hyperplasia with lower urinary tract symptoms (LUTS)    urologist-- dr Diona Fanti  . Chronic anticoagulation    Xarelto for afib  . Chronic constipation   . Chronic cystitis   . Gross hematuria   . History of DVT of lower extremity 06/08/2010   right femoral dvt dx post op total hip 05-08-2010  . History of gastric ulcer    remote  . History of kidney stones    one stone. remote hx  . Hyperlipidemia   . Hypertension    Dx by Dr. Lance Muss around age  49   . NICM (nonischemic cardiomyopathy) ([place])    per echo 06-11-2017,  ef 45-50% with diffuse hypokinesis,  G1DD  . OSA on CPAP 2017   compliant with CPAP.  managed by Dr Elsworth Soho.  . Osteoarthritis   . Other urethral stricture, male, meatal    chronic;  pt self dilates  . Paroxysmal atrial fibrillation [facility]) cardiologist-- dr Rayann Heman   first dx 2009/   a. documented by Dr Barrett Shell on office EKG 9/12. b. maintained on Tikosyn and Xarelto. c. s/p DCCV 's.  d.  x3  EP w/ ablation atrial fib last one 12-05-2016  . Prediabetes   . Premature ventricular contraction   . PVC's (premature ventricular contractions)   . RA (rheumatoid arthritis) [facility])    rheumatologist--- Dr Page Spiro,  treated with Humira  . RBBB (right bundle branch block)    Hx of a fib, ablation Aug 2018  . Rheumatoid arthritis ([place])   . Vitamin D deficiency   . Wears hearing aid in both ears    Past Surgical History:  Procedure Laterality Date  . ABLATION OF DYSRHYTHMIC FOCUS  08/29/2015  . ATRIAL  FIBRILLATION ABLATION  12/05/2016  . ATRIAL FIBRILLATION ABLATION N/A 12/05/2016   Procedure: Atrial Fibrillation Ablation;  Surgeon: [name], MD;  Location: [place] CV LAB;  Service: Cardiovascular;  Laterality: N/A;  . ATRIAL FIBRILLATION ABLATION N/A 02/29/2020   Procedure: ATRIAL FIBRILLATION ABLATION;  Surgeon: [name], MD;  Location: [employer] [place] [employer] CV LAB;  Service: Cardiovascular;  Laterality: N/A;  . ATRIAL FLUTTER ABLATION  09/2010   by Greggory Brandy  . BUNIONECTOMY Right 1990s  . CARDIOVERSION N/A 10/28/2012   Procedure: CARDIOVERSION;  Surgeon: Burnell Blanks, MD;  Location: [place];  Service: Cardiovascular;  Laterality: N/A;  . CATARACT EXTRACTION W/ INTRAOCULAR LENS  IMPLANT, BILATERAL  2019  . CYSTOSCOPY WITH BIOPSY N/A 09/03/2018   Procedure: CYSTOSCOPY WITH FULGURATION OFURETHRA;  Surgeon: Diona Fanti,  Annie Main, MD;  Location: [facility];  Service: Urology;  Laterality: N/A;  30 MINS  . ELECTROPHYSIOLOGIC STUDY N/A 08/29/2015   Procedure: Atrial Fibrillation Ablation;  Surgeon: [name], MD;  Location: [place] CV LAB;  Service: Cardiovascular;  Laterality: N/A;  . KNEE SURGERY Bilateral x3   1960s & 1970s   open  . LEFT HEART CATHETERIZATION WITH CORONARY ANGIOGRAM N/A 06/07/2011   Procedure: LEFT HEART CATHETERIZATION WITH CORONARY ANGIOGRAM;  Surgeon: Peter M Martinique, MD;  Location: [facility] CATH LAB;  Service: Cardiovascular;  Laterality: N/A;    Normal coronary arteries,  low normal LVF  . TONSILLECTOMY AND ADENOIDECTOMY  age 14  . TOTAL HIP ARTHROPLASTY Right 06/04/10    dr Noemi Chapel  @[facility]   . TOTAL KNEE ARTHROPLASTY Bilateral right 1995;  left 1997  . TRANSURETHRAL RESECTION OF PROSTATE  06-29-2002   dr humphries  @[facility]   . TRANSURETHRAL RESECTION OF PROSTATE N/A 07/06/2018   Procedure: TRANSURETHRAL RESECTION OF THE PROSTATE (TURP);  Surgeon: Franchot Gallo, MD;  Location: [facility];  Service: Urology;  Laterality: N/A;  70 MINS   Family History  Problem  Relation Age of Onset  . Heart attack Father   . Lung disease Mother   . Arrhythmia Sister   . Atrial fibrillation Brother   . Diabetes Neg Hx    Social History   Socioeconomic History  . Marital status: Married    Spouse name: Joycelyn Schmid  . Number of children: Not on file  . Years of education: Not on file  . Highest education level: Not on file  Occupational History  . Occupation: Retired  Tobacco Use  . Smoking status: Former Smoker    Packs/day: 1.00    Years: 30.00    Pack years: 30.00    Types: Cigarettes    Quit date: 05/06/1978    Years since quitting: 42.1  . Smokeless tobacco: Never Used  Vaping Use  . Vaping Use: Never used  Substance and Sexual Activity  . Alcohol use: Yes    Comment: occasionally wine and beer  . Drug use: Never  . Sexual activity: Not on file  Other Topics Concern  . Not on file  Social History Narrative   he patient lives in [place] with his spouse.  Retired.   Social Determinants of Health   Financial Resource Strain: Low Risk   . Difficulty of Paying Living Expenses: Not hard at all  Food Insecurity: No Food Insecurity  . Worried About Charity fundraiser in the Last Year: Never true  . Ran Out of Food in the Last Year: Never true  Transportation Needs: No Transportation Needs  . Lack of Transportation (Medical): No  . Lack of Transportation (Non-Medical): No  Physical Activity: Sufficiently Active  . Days of Exercise per Week: 7 days  . Minutes of Exercise per Session: 30 min  Stress: No Stress Concern Present  . Feeling of Stress : Not at all  Social Connections: Moderately Integrated  . Frequency of Communication with Friends and Family: More than three times a week  . Frequency of Social Gatherings with Friends and Family: Once a week  . Attends Religious Services: More than 4 times per year  . Active Member of Clubs or Organizations: No  . Attends Archivist Meetings: Never  . Marital Status: Married     Tobacco Counseling Counseling given: Not Answered   Clinical Intake:  Pre-visit preparation completed: Yes  Pain : No/denies pain  Nutritional Status: BMI > 30  Obese Nutritional Risks: None Diabetes: No  How often do you need to have someone help you when you read instructions, pamphlets, or other written materials from your doctor or pharmacy?: 1 - Never  Diabetic?No  Interpreter Needed?: No  Information entered by :: Caroleen Hamman LPN   Activities of Daily Living In your present state of health, do you have any difficulty performing the following activities: 06/08/2020  Hearing? N  Vision? N  Difficulty concentrating or making decisions? N  Walking or climbing stairs? N  Dressing or bathing? N  Doing errands, shopping? N  Preparing Food and eating ? N  Using the Toilet? N  In the past six months, have you accidently leaked urine? N  Do you have problems with loss of bowel control? N  Managing your Medications? N  Managing your Finances? N  Housekeeping or managing your Housekeeping? N  Some recent data might be hidden    Patient Care Team: Copland, Gay Filler, MD as PCP - General (Family Medicine) [name], MD as PCP - Cardiology (Cardiology) Elsie Saas, MD (Orthopedic Surgery)  Indicate any recent Medical Services you may have received from other than [facility] providers in the past year (date may be approximate).     Assessment:   This is a routine wellness examination for [place].  Hearing/Vision screen  Hearing Screening   125Hz  250Hz  500Hz  1000Hz  2000Hz  3000Hz  4000Hz  6000Hz  8000Hz   Right ear:           Left ear:           Comments: Wears hearing aids  Vision Screening Comments: Reading glasses Last eye exam-2021-Dr. Herbert Deaner  Dietary issues and exercise activities discussed: Current Exercise Habits: Home exercise routine, Type of exercise: walking, Time (Minutes): 30, Frequency (Times/Week): 7, Weekly Exercise (Minutes/Week): 210,  Intensity: Mild, Exercise limited by: None identified  Goals    . Increase physical activity      Depression Screen PHQ 2/9 Scores 06/08/2020 05/20/2019 11/20/2017 11/08/2016 09/20/2015 11/24/2014 09/20/2014  PHQ - 2 Score 0 0 0 0 0 0 0    Fall Risk Fall Risk  06/08/2020 05/20/2019 11/20/2017 11/08/2016 09/20/2015  Falls in the past year? 0 0 No Yes No  Number falls in past yr: 0 0 - 1 -  Injury with Fall? 0 0 - - -  Risk for fall due to : - - - - Impaired balance/gait  Follow up Falls prevention discussed Education provided;Falls prevention discussed - Falls prevention discussed -    FALL RISK PREVENTION PERTAINING TO THE HOME:  Any stairs in or around the home? Yes  If so, are there any without handrails? No  Home free of loose throw rugs in walkways, pet beds, electrical cords, etc? Yes  Adequate lighting in your home to reduce risk of falls? Yes   ASSISTIVE DEVICES UTILIZED TO PREVENT FALLS:  Life alert? No  Use of a cane, walker or w/c? No  Grab bars in the bathroom? Yes  Shower chair or bench in shower? No  Elevated toilet seat or a handicapped toilet? No   TIMED UP AND GO:  Was the test performed? No . Phone visit   Cognitive Function:Normal cognitive status assessed by  this Nurse Health Advisor. No abnormalities found.       6CIT Screen 05/20/2019  What Year? 0 points  What month? 0 points  What time? 0 points  Count back from 20 0 points  Months in reverse 0 points  Repeat phrase 0 points  Total Score 0    Immunizations Immunization History  Administered Date(s) Administered  . Influenza Split 02/03/2013  . Influenza, High Dose Seasonal PF 02/03/2017, 01/31/2020  . Influenza,inj,Quad PF,6+ Mos 02/28/2015  . Influenza-Unspecified 02/24/2014, 01/09/2016, 02/05/2017, 01/17/2018  . PFIZER(Purple Top)SARS-COV-2 Vaccination 05/19/2019, 06/08/2019, 01/31/2020  . Pneumococcal Conjugate-13 09/16/2013  . Pneumococcal Polysaccharide-23 09/20/2015  . Tdap 11/08/2016  .  Zoster 06/16/2006  . Zoster Recombinat (Shingrix) 01/19/2019, 04/21/2019    TDAP status: Up to date  Flu Vaccine status: Up to date  Pneumococcal vaccine status: Up to date  Covid-19 vaccine status: Completed vaccines  Qualifies for Shingles Vaccine? No   Zostavax completed Yes   Shingrix Completed?: Yes  Screening Tests Health Maintenance  Topic Date Due  . COVID-19 Vaccine (4 - Booster for Pfizer series) 07/30/2020  . TETANUS/TDAP  11/09/2026  . INFLUENZA VACCINE  Completed  . PNA vac Low Risk Adult  Completed    Health Maintenance  There are no preventive care reminders to display for this patient.  Colorectal cancer screening: No longer required.   Lung Cancer Screening: (Low Dose CT Chest recommended if Age 34-80 years, 30 pack-year currently smoking OR have quit w/in 15years.) does not qualify.     Additional Screening:  Hepatitis C Screening: does not qualify  Vision Screening: Recommended annual ophthalmology exams for early detection of glaucoma and other disorders of the eye. Is the patient up to date with their annual eye exam?  Yes  Who is the provider or what is the name of the office in which the patient attends annual eye exams? Dr. Herbert Deaner   Dental Screening: Recommended annual dental exams for proper oral hygiene  Community Resource Referral / Chronic Care Management: CRR required this visit?  No   CCM required this visit?  No      Plan:     I have personally reviewed and noted the following in the patient's chart:   . Medical and social history . Use of alcohol, tobacco or illicit drugs  . Current medications and supplements . Functional ability and status . Nutritional status . Physical activity . Advanced directives . List of other physicians . Hospitalizations, surgeries, and ER visits in previous 12 months . Vitals . Screenings to include cognitive, depression, and falls . Referrals and appointments  In addition, I have  reviewed and discussed with patient certain preventive protocols, quality metrics, and best practice recommendations. A written personalized care plan for preventive services as well as general preventive health recommendations were provided to patient.   Due to this being a telephonic visit, the after visit summary with patients personalized plan was offered to patient via mail or my-chart.  Patient would like to access on my-chart.    Marta Antu, LPN   08/08/4096  Nurse Health Advisor  Nurse Notes: None

## 2020-06-07 NOTE — Therapy (Signed)
[facility] [address] [place], [place], [address] Phone: [phone]   Fax:  [phone]  Physical Therapy Treatment  Patient Details  Name: Connor [name] MRN: [id number] Date of Birth: 09-20-1933 Referring Provider (PT): Star Age, MD   Encounter Date: 06/06/2020   PT End of Session - 06/07/20 1915    Visit Number 6    Number of Visits 13    Date for PT Re-Evaluation 06/28/20    Authorization Type [employer] -- paper Josem Kaufmann requested 05/17/20; 05-17-20 - 06-28-20    Authorization - Visit Number 6    Authorization - Number of Visits 12    Progress Note Due on Visit 10    PT Start Time 1102    PT Stop Time 1146    PT Time Calculation (min) 44 min    Activity Tolerance Patient tolerated treatment well    Behavior During Therapy [facility] for tasks assessed/performed           Past Medical History:  Diagnosis Date  . Benign localized prostatic hyperplasia with lower urinary tract symptoms (LUTS)    urologist-- dr Diona Fanti  . Chronic anticoagulation    Xarelto for afib  . Chronic constipation   . Chronic cystitis   . Gross hematuria   . History of DVT of lower extremity 06/08/2010   right femoral dvt dx post op total hip 05-08-2010  . History of gastric ulcer    remote  . History of kidney stones    one stone. remote hx  . Hyperlipidemia   . Hypertension    Dx by Dr. Lance Muss around age  33   . NICM (nonischemic cardiomyopathy) ([place])    per echo 06-11-2017,  ef 45-50% with diffuse hypokinesis,  G1DD  . OSA on CPAP 2017   compliant with CPAP.  managed by Dr Elsworth Soho.  . Osteoarthritis   . Other urethral stricture, male, meatal    chronic;  pt self dilates  . Paroxysmal atrial fibrillation [facility]) cardiologist-- dr Rayann Heman   first dx 2009/   a. documented by Dr Barrett Shell on office EKG 9/12. b. maintained on Tikosyn and Xarelto. c. s/p DCCV 's.  d.  x3  EP w/ ablation atrial fib last one 12-05-2016  .  Prediabetes   . Premature ventricular contraction   . PVC's (premature ventricular contractions)   . RA (rheumatoid arthritis) [facility])    rheumatologist--- Dr Page Spiro,  treated with Humira  . RBBB (right bundle branch block)    Hx of a fib, ablation Aug 2018  . Rheumatoid arthritis ([place])   . Vitamin D deficiency   . Wears hearing aid in both ears     Past Surgical History:  Procedure Laterality Date  . ABLATION OF DYSRHYTHMIC FOCUS  08/29/2015  . ATRIAL FIBRILLATION ABLATION  12/05/2016  . ATRIAL FIBRILLATION ABLATION N/A 12/05/2016   Procedure: Atrial Fibrillation Ablation;  Surgeon: [name], MD;  Location: [place] CV LAB;  Service: Cardiovascular;  Laterality: N/A;  . ATRIAL FIBRILLATION ABLATION N/A 02/29/2020   Procedure: ATRIAL FIBRILLATION ABLATION;  Surgeon: [name], MD;  Location: [place] CV LAB;  Service: Cardiovascular;  Laterality: N/A;  . ATRIAL FLUTTER ABLATION  09/2010   by Greggory Brandy  . BUNIONECTOMY Right 1990s  . CARDIOVERSION N/A 10/28/2012   Procedure: CARDIOVERSION;  Surgeon: Burnell Blanks, MD;  Location: [facility];  Service: Cardiovascular;  Laterality: N/A;  . CATARACT EXTRACTION W/ INTRAOCULAR LENS  IMPLANT, BILATERAL  2019  .  CYSTOSCOPY WITH BIOPSY N/A 09/03/2018   Procedure: CYSTOSCOPY WITH FULGURATION [id number];  Surgeon: Franchot Gallo, MD;  Location: [facility];  Service: Urology;  Laterality: N/A;  30 MINS  . ELECTROPHYSIOLOGIC STUDY N/A 08/29/2015   Procedure: Atrial Fibrillation Ablation;  Surgeon: [name], MD;  Location: [place] CV LAB;  Service: Cardiovascular;  Laterality: N/A;  . KNEE SURGERY Bilateral x3   1960s & 1970s   open  . LEFT HEART CATHETERIZATION WITH CORONARY ANGIOGRAM N/A 06/07/2011   Procedure: LEFT HEART CATHETERIZATION WITH CORONARY ANGIOGRAM;  Surgeon: Peter M Martinique, MD;  Location: [facility] CATH LAB;  Service: Cardiovascular;  Laterality: N/A;    Normal coronary arteries,  low normal LVF  . TONSILLECTOMY AND  ADENOIDECTOMY  age 79  . TOTAL HIP ARTHROPLASTY Right 06/04/10    dr Noemi Chapel  @[facility]   . TOTAL KNEE ARTHROPLASTY Bilateral right 1995;  left 1997  . TRANSURETHRAL RESECTION OF PROSTATE  06-29-2002   dr humphries  @[facility]   . TRANSURETHRAL RESECTION OF PROSTATE N/A 07/06/2018   Procedure: TRANSURETHRAL RESECTION OF THE PROSTATE (TURP);  Surgeon: Franchot Gallo, MD;  Location[facility];  Service: Urology;  Laterality: N/A;  45 MINS    There were no vitals filed for this visit.   Subjective Assessment - 06/07/20 1911    Subjective Pt reports he feels balance is getting a little better    Patient Stated Goals Improve morning dizziness    Currently in Pain? No/denies                             [facility] Adult PT Treatment/Exercise - 06/07/20 0001      Exercises   Exercises Knee/Hip      Knee/Hip Exercises: Standing   Hip Flexion Stengthening;Right;Left;10 reps;Knee bent;Knee straight;2 sets   green theraband used   Hip Abduction Stengthening;Right;Left;1 set;10 reps;Knee straight   green theraband   Hip Extension Stengthening;Right;Left;1 set;10 reps;Knee straight   with green theraband     Knee/Hip Exercises: Seated   Long Arc Quad Strengthening;Right;Left;1 set;10 reps   with green theraband   Hamstring Curl Strengthening;Right;Left;1 set;10 reps   with green theraband              Balance Exercises - 06/07/20 0001      Balance Exercises: Standing   Standing Eyes Opened Wide (BOA);Head turns;Foam/compliant surface;5 reps    Standing Eyes Closed Wide (BOA);Head turns;Foam/compliant surface;5 reps   horizontal and vertical head turns   Rockerboard Anterior/posterior;EO;EC;10 reps;UE support;Head turns;Lateral   performed with rocker board on treadmill for use of the bars for UE support   Balance Beam standing on blue balance beam - alternate lifting UE's for improved core stabilization;  pt performed horizontal and vertica head turns with minimal UE  support on // bars 5 reps each ; standing with EC 10 secs with UE support prn    Step Over Hurdles / Cones 5 reps stepping over and back of blue balance beam with UE support on // bars prn    Other Standing Exercises sit to stand 5 reps with EC - feet on Airex - CGA for safety    Other Standing Exercises Comments pt performed marching on incline - EO 5 reps; EO with horizontal head turns and 5 reps with vertical head turns; EC 5 reps no head turns, then 5 reps horizontal and 5 reps vertical head turns with min to mod assist  PT Short Term Goals - 06/07/20 1921      PT SHORT TERM GOAL #1   Title independent with initial HEP    Time 3    Period Weeks    Status New    Target Date 06/07/20      PT SHORT TERM GOAL #2   Title Pt will demo improved mCTSIB score to at least 115    Baseline mCTSIB score 107    Time 3    Period Weeks    Status New    Target Date 06/07/20      PT SHORT TERM GOAL #3   Title Pt will report 50% improvement of his morning unsteadiness    Baseline Near falls in the morning    Time 3    Period Weeks    Status New    Target Date 06/07/20             PT Long Term Goals - 06/07/20 1921      PT LONG TERM GOAL #1   Title Pt will be independent with final HEP    Time 6    Period Weeks    Status New      PT LONG TERM GOAL #2   Title Pt will have mCTSIB of 120; able to maintain balance in all conditions    Time 6    Period Weeks    Status New      PT LONG TERM GOAL #3   Title Pt will have improved FGA score of at least 25/30 to demo decreased risk of falls    Baseline 21/30    Time 6    Period Weeks    Status New                 Plan - 06/07/20 1916    Clinical Impression Statement Pt reported legs were tired after performing PRE's in standing with green theraband for bil. hip strengthening.  Pt demonstrating improvement with maintaining balance on compliant surfaces with EO - continues to have LOB with EC on compliant  surfaces.  Cont with POC.    Personal Factors and Comorbidities Age;Comorbidity 1;Comorbidity 2;Comorbidity 3+    Comorbidities paroxysmal A. fib with status post ablation x 3, hearing loss, vitamin D deficiency, rheumatoid arthritis, right bundle branch block, obstructive sleep apnea on CPAP, nonischemic cardiomyopathy, hypertension, hyperlipidemia, kidney stones, history of gastric ulcer, DVT, PVCs, borderline obesity, and history of bilat TKA and R THA.    Examination-Activity Limitations Stand;Sit;Locomotion Level;Transfers    Examination-Participation Restrictions Community Activity    Stability/Clinical Decision Making Evolving/Moderate complexity    Rehab Potential Good    PT Frequency 2x / week    PT Duration 6 weeks    PT Treatment/Interventions ADLs/Self Care Home Management;Canalith Repostioning;Electrical Stimulation;Aquatic Therapy;Moist Heat;Gait training;Stair training;Functional mobility training;Therapeutic activities;DME Instruction;Therapeutic exercise;Neuromuscular re-education;Balance training;Patient/family education;Manual techniques;Taping;Vestibular    PT Next Visit Plan Continue to focus on gaze stabilization, balance with vision removed on compliant surfaces, and vestibular integration. Work on dynamic gait and weight shifting (consider walking on foam).    PT Home Exercise Plan Access Code: [email or web address]; added hip strengthening with green theraband - Medbridge [email or web address]    Consulted and Agree with Plan of Care Patient           Patient will benefit from skilled therapeutic intervention in order to improve the following deficits and impairments:  Difficulty walking,Dizziness,Decreased activity tolerance,Decreased balance,Decreased mobility,Decreased strength  Visit Diagnosis: Unsteadiness on feet  Other abnormalities of gait  and mobility     Problem List Patient Active Problem List   Diagnosis Date Noted  . Secondary hypercoagulable state ([place]) 03/28/2020  .  Malignant neoplasm of prostate ([place]) 09/11/2018  . Enlarged prostate with urinary obstruction 07/06/2018  . Gastrocnemius strain, right, initial encounter 12/10/2017  . Baker's cyst of knee, right 12/10/2017  . Obesity 03/24/2017  . Paroxysmal atrial fibrillation ([place]) 12/05/2016  . Near syncope 04/20/2016  . Leg hematoma, left, initial encounter 04/20/2016  . Bleeding   . Ecchymosis   . Left hamstring muscle strain   . Muscle tear   . RBBB 08/30/2015  . Chronic systolic dysfunction of left ventricle 07/11/2014  . OSA (obstructive sleep apnea) 04/07/2013  . Multinodular goiter 01/20/2013  . Cervical spondylosis without myelopathy 01/18/2013  . Atrial fibrillation with RVR ([place]) 10/27/2012  . PAF (paroxysmal atrial fibrillation) ([place]) 05/28/2012  . Left ventricular dysfunction 12/17/2011  . Low back pain 09/11/2011  . Fatigue 05/22/2011  . PVC (premature ventricular contraction) 01/18/2011  . Persistent atrial fibrillation ([place]) 01/12/2011  . Hyperlipidemia 08/02/2010  . Hypertension 08/02/2010  . Osteoarthritis 08/02/2010  . BPH (benign prostatic hyperplasia) 08/02/2010  . GERD (gastroesophageal reflux disease) 08/02/2010  . h/o Atrial flutter [facility]) s/p ablation 2012     Nikholas Geffre Suzanne, PT 06/07/2020, 7:31 PM  [facility] [address] [place], [place], [address] Phone: [phone]   Fax:  [phone]  Name: Connor [name] MRN: [id number] Date of Birth: Aug 23, 1933

## 2020-06-08 ENCOUNTER — Ambulatory Visit (INDEPENDENT_AMBULATORY_CARE_PROVIDER_SITE_OTHER): Payer: Medicare PPO

## 2020-06-08 VITALS — Ht 72.0 in | Wt 227.0 lb

## 2020-06-08 DIAGNOSIS — Z0000 Encounter for general adult medical examination without abnormal findings: Secondary | ICD-10-CM

## 2020-06-08 NOTE — Patient Instructions (Signed)
Connor [name] , Thank you for taking time to complete your Medicare Wellness Visit. I appreciate your ongoing commitment to your health goals. Please review the following plan we discussed and let me know if I can assist you in the future.   Screening recommendations/referrals: Colonoscopy: No longer required Recommended yearly ophthalmology/optometry visit for glaucoma screening and checkup Recommended yearly dental visit for hygiene and checkup  Vaccinations: Influenza vaccine: Up to date Pneumococcal vaccine: Completed vaccines Tdap vaccine: Up to date-Due 11/09/2026 Shingles vaccine: Completed vaccines   Covid-19: Completed vaccines  Advanced directives: Copy in chart  Conditions/risks identified: See problem llist  Next appointment: Follow up in one year for your annual wellness visit. 06/14/2021 @ 11:00  Preventive Care 65 Years and Older, Male Preventive care refers to lifestyle choices and visits with your health care provider that can promote health and wellness. What does preventive care include?  A yearly physical exam. This is also called an annual well check.  Dental exams once or twice a year.  Routine eye exams. Ask your health care provider how often you should have your eyes checked.  Personal lifestyle choices, including:  Daily care of your teeth and gums.  Regular physical activity.  Eating a healthy diet.  Avoiding tobacco and drug use.  Limiting alcohol use.  Practicing safe sex.  Taking low doses of aspirin every day.  Taking vitamin and mineral supplements as recommended by your health care provider. What happens during an annual well check? The services and screenings done by your health care provider during your annual well check will depend on your age, overall health, lifestyle risk factors, and family history of disease. Counseling  Your health care provider may ask you questions about your:  Alcohol use.  Tobacco use.  Drug  use.  Emotional well-being.  Home and relationship well-being.  Sexual activity.  Eating habits.  History of falls.  Memory and ability to understand (cognition).  Work and work Statistician. Screening  You may have the following tests or measurements:  Height, weight, and BMI.  Blood pressure.  Lipid and cholesterol levels. These may be checked every 5 years, or more frequently if you are over 85 years old.  Skin check.  Lung cancer screening. You may have this screening every year starting at age 85 if you have a 30-pack-year history of smoking and currently smoke or have quit within the past 15 years.  Fecal occult blood test (FOBT) of the stool. You may have this test every year starting at age 85.  Flexible sigmoidoscopy or colonoscopy. You may have a sigmoidoscopy every 5 years or a colonoscopy every 10 years starting at age 85.  Prostate cancer screening. Recommendations will vary depending on your family history and other risks.  Hepatitis C blood test.  Hepatitis B blood test.  Sexually transmitted disease (STD) testing.  Diabetes screening. This is done by checking your blood sugar (glucose) after you have not eaten for a while (fasting). You may have this done every 1-3 years.  Abdominal aortic aneurysm (AAA) screening. You may need this if you are a current or former smoker.  Osteoporosis. You may be screened starting at age 85 if you are at high risk. Talk with your health care provider about your test results, treatment options, and if necessary, the need for more tests. Vaccines  Your health care provider may recommend certain vaccines, such as:  Influenza vaccine. This is recommended every year.  Tetanus, diphtheria, and acellular pertussis (Tdap, Td) vaccine.  You may need a Td booster every 10 years.  Zoster vaccine. You may need this after age 29.  Pneumococcal 13-valent conjugate (PCV13) vaccine. One dose is recommended after age  37.  Pneumococcal polysaccharide (PPSV23) vaccine. One dose is recommended after age 102. Talk to your health care provider about which screenings and vaccines you need and how often you need them. This information is not intended to replace advice given to you by your health care provider. Make sure you discuss any questions you have with your health care provider. Document Released: 05/19/2015 Document Revised: 01/10/2016 Document Reviewed: 02/21/2015 Elsevier Interactive Patient Education  2017 [place] Prevention in the Home Falls can cause injuries. They can happen to people of all ages. There are many things you can do to make your home safe and to help prevent falls. What can I do on the outside of my home?  Regularly fix the edges of walkways and driveways and fix any cracks.  Remove anything that might make you trip as you walk through a door, such as a raised step or threshold.  Trim any bushes or trees on the path to your home.  Use bright outdoor lighting.  Clear any walking paths of anything that might make someone trip, such as rocks or tools.  Regularly check to see if handrails are loose or broken. Make sure that both sides of any steps have handrails.  Any raised decks and porches should have guardrails on the edges.  Have any leaves, snow, or ice cleared regularly.  Use sand or salt on walking paths during winter.  Clean up any spills in your garage right away. This includes oil or grease spills. What can I do in the bathroom?  Use night lights.  Install grab bars by the toilet and in the tub and shower. Do not use towel bars as grab bars.  Use non-skid mats or decals in the tub or shower.  If you need to sit down in the shower, use a plastic, non-slip stool.  Keep the floor dry. Clean up any water that spills on the floor as soon as it happens.  Remove soap buildup in the tub or shower regularly.  Attach bath mats securely with double-sided  non-slip rug tape.  Do not have throw rugs and other things on the floor that can make you trip. What can I do in the bedroom?  Use night lights.  Make sure that you have a light by your bed that is easy to reach.  Do not use any sheets or blankets that are too big for your bed. They should not hang down onto the floor.  Have a firm chair that has side arms. You can use this for support while you get dressed.  Do not have throw rugs and other things on the floor that can make you trip. What can I do in the kitchen?  Clean up any spills right away.  Avoid walking on wet floors.  Keep items that you use a lot in easy-to-reach places.  If you need to reach something above you, use a strong step stool that has a grab bar.  Keep electrical cords out of the way.  Do not use floor polish or wax that makes floors slippery. If you must use wax, use non-skid floor wax.  Do not have throw rugs and other things on the floor that can make you trip. What can I do with my stairs?  Do not leave any  items on the stairs.  Make sure that there are handrails on both sides of the stairs and use them. Fix handrails that are broken or loose. Make sure that handrails are as long as the stairways.  Check any carpeting to make sure that it is firmly attached to the stairs. Fix any carpet that is loose or worn.  Avoid having throw rugs at the top or bottom of the stairs. If you do have throw rugs, attach them to the floor with carpet tape.  Make sure that you have a light switch at the top of the stairs and the bottom of the stairs. If you do not have them, ask someone to add them for you. What else can I do to help prevent falls?  Wear shoes that:  Do not have high heels.  Have rubber bottoms.  Are comfortable and fit you well.  Are closed at the toe. Do not wear sandals.  If you use a stepladder:  Make sure that it is fully opened. Do not climb a closed stepladder.  Make sure that both  sides of the stepladder are locked into place.  Ask someone to hold it for you, if possible.  Clearly mark and make sure that you can see:  Any grab bars or handrails.  First and last steps.  Where the edge of each step is.  Use tools that help you move around (mobility aids) if they are needed. These include:  Canes.  Walkers.  Scooters.  Crutches.  Turn on the lights when you go into a dark area. Replace any light bulbs as soon as they burn out.  Set up your furniture so you have a clear path. Avoid moving your furniture around.  If any of your floors are uneven, fix them.  If there are any pets around you, be aware of where they are.  Review your medicines with your doctor. Some medicines can make you feel dizzy. This can increase your chance of falling. Ask your doctor what other things that you can do to help prevent falls. This information is not intended to replace advice given to you by your health care provider. Make sure you discuss any questions you have with your health care provider. Document Released: 02/16/2009 Document Revised: 09/28/2015 Document Reviewed: 05/27/2014 Elsevier Interactive Patient Education  2017 [employer].

## 2020-06-10 ENCOUNTER — Encounter: Payer: Self-pay | Admitting: Family Medicine

## 2020-06-13 ENCOUNTER — Other Ambulatory Visit: Payer: Self-pay

## 2020-06-13 ENCOUNTER — Ambulatory Visit: Payer: Medicare PPO | Admitting: Physical Therapy

## 2020-06-13 DIAGNOSIS — R2681 Unsteadiness on feet: Secondary | ICD-10-CM

## 2020-06-13 DIAGNOSIS — R42 Dizziness and giddiness: Secondary | ICD-10-CM | POA: Diagnosis not present

## 2020-06-13 DIAGNOSIS — R2689 Other abnormalities of gait and mobility: Secondary | ICD-10-CM

## 2020-06-13 DIAGNOSIS — R262 Difficulty in walking, not elsewhere classified: Secondary | ICD-10-CM | POA: Diagnosis not present

## 2020-06-14 NOTE — Therapy (Signed)
[facility] [address] [place], [place], [address] Phone: [phone]   Fax:  [phone]  Physical Therapy Treatment  Patient Details  Name: Connor [name] MRN: [id number] Date of Birth: 20-Nov-1933 Referring Provider (PT): Star Age, MD   Encounter Date: 06/13/2020   PT End of Session - 06/14/20 1207    Visit Number 7    Number of Visits 13    Date for PT Re-Evaluation 06/28/20    Authorization Type [employer] -- paper Josem Kaufmann requested 05/17/20; 05-17-20 - 06-28-20    Authorization - Visit Number 7    Authorization - Number of Visits 12    Progress Note Due on Visit 10    PT Start Time 1103    PT Stop Time 1146    PT Time Calculation (min) 43 min    Activity Tolerance Patient tolerated treatment well    Behavior During Therapy [facility] for tasks assessed/performed           Past Medical History:  Diagnosis Date  . Benign localized prostatic hyperplasia with lower urinary tract symptoms (LUTS)    urologist-- dr Diona Fanti  . Chronic anticoagulation    Xarelto for afib  . Chronic constipation   . Chronic cystitis   . Gross hematuria   . History of DVT of lower extremity 06/08/2010   right femoral dvt dx post op total hip 05-08-2010  . History of gastric ulcer    remote  . History of kidney stones    one stone. remote hx  . Hyperlipidemia   . Hypertension    Dx by Dr. Lance Muss around age  67   . NICM (nonischemic cardiomyopathy) ([place])    per echo 06-11-2017,  ef 45-50% with diffuse hypokinesis,  G1DD  . OSA on CPAP 2017   compliant with CPAP.  managed by Dr Elsworth Soho.  . Osteoarthritis   . Other urethral stricture, male, meatal    chronic;  pt self dilates  . Paroxysmal atrial fibrillation [facility]) cardiologist-- dr Rayann Heman   first dx 2009/   a. documented by Dr Barrett Shell on office EKG 9/12. b. maintained on Tikosyn and Xarelto. c. s/p DCCV 's.  d.  x3  EP w/ ablation atrial fib last one 12-05-2016  .  Prediabetes   . Premature ventricular contraction   . PVC's (premature ventricular contractions)   . RA (rheumatoid arthritis) [facility])    rheumatologist--- Dr Page Spiro,  treated with Humira  . RBBB (right bundle branch block)    Hx of a fib, ablation Aug 2018  . Rheumatoid arthritis ([place])   . Vitamin D deficiency   . Wears hearing aid in both ears     Past Surgical History:  Procedure Laterality Date  . ABLATION OF DYSRHYTHMIC FOCUS  08/29/2015  . ATRIAL FIBRILLATION ABLATION  12/05/2016  . ATRIAL FIBRILLATION ABLATION N/A 12/05/2016   Procedure: Atrial Fibrillation Ablation;  Surgeon: [name], MD;  Location: [place] CV LAB;  Service: Cardiovascular;  Laterality: N/A;  . ATRIAL FIBRILLATION ABLATION N/A 02/29/2020   Procedure: ATRIAL FIBRILLATION ABLATION;  Surgeon: [name], MD;  Location: [place] CV LAB;  Service: Cardiovascular;  Laterality: N/A;  . ATRIAL FLUTTER ABLATION  09/2010   by Greggory Brandy  . BUNIONECTOMY Right 1990s  . CARDIOVERSION N/A 10/28/2012   Procedure: CARDIOVERSION;  Surgeon: Burnell Blanks, MD;  Location: [place];  Service: Cardiovascular;  Laterality: N/A;  . CATARACT EXTRACTION W/ INTRAOCULAR LENS  IMPLANT, BILATERAL  2019  .  CYSTOSCOPY WITH BIOPSY N/A 09/03/2018   Procedure: CYSTOSCOPY WITH FULGURATION [id number];  Surgeon: Franchot Gallo, MD;  Location: [facility];  Service: Urology;  Laterality: N/A;  30 MINS  . ELECTROPHYSIOLOGIC STUDY N/A 08/29/2015   Procedure: Atrial Fibrillation Ablation;  Surgeon: [name], MD;  Location: [place] CV LAB;  Service: Cardiovascular;  Laterality: N/A;  . KNEE SURGERY Bilateral x3   1960s & 1970s   open  . LEFT HEART CATHETERIZATION WITH CORONARY ANGIOGRAM N/A 06/07/2011   Procedure: LEFT HEART CATHETERIZATION WITH CORONARY ANGIOGRAM;  Surgeon: Peter M Martinique, MD;  Location: [facility] CATH LAB;  Service: Cardiovascular;  Laterality: N/A;    Normal coronary arteries,  low normal LVF  . TONSILLECTOMY AND  ADENOIDECTOMY  age 38  . TOTAL HIP ARTHROPLASTY Right 06/04/10    dr Noemi Chapel  @[facility]   . TOTAL KNEE ARTHROPLASTY Bilateral right 1995;  left 1997  . TRANSURETHRAL RESECTION OF PROSTATE  06-29-2002   dr humphries  @[facility]   . TRANSURETHRAL RESECTION OF PROSTATE N/A 07/06/2018   Procedure: TRANSURETHRAL RESECTION OF THE PROSTATE (TURP);  Surgeon: Franchot Gallo, MD;  Location: [facility];  Service: Urology;  Laterality: N/A;  45 MINS    There were no vitals filed for this visit.   Subjective Assessment - 06/13/20 1112    Subjective Pt reports he feels balance is getting a little better; pt asks "will I ever completely get over this?" - Pt was informed that answer to that question is unknown at this time    Patient Stated Goals Improve morning dizziness    Currently in Pain? No/denies                             [facility] Adult PT Treatment/Exercise - 06/14/20 0001      Ambulation/Gait   Ambulation/Gait Yes    Ambulation Distance (Feet) 115 Feet    Assistive device None    Gait Pattern Within Functional Limits    Ambulation Surface Level;Indoor    Gait Comments pt amb. with horizontal and vertical head turns with SBA - no major LOB occurred with this activity      High Level Balance   High Level Balance Activities Backward walking;Turns;Sudden stops;Head turns;Marching forwards               Balance Exercises - 06/14/20 0001      Balance Exercises: Standing   Standing Eyes Closed Wide (BOA);Head turns;Foam/compliant surface;5 reps   horizontal and vertical head turns   Rockerboard Anterior/posterior;EO;EC;10 reps;UE support;Head turns;Lateral   performed with rocker board on treadmill for use of the bars for UE support   Balance Beam standing on blue balance beam - alternate lifting UE's for improved core stabilization;  pt performed horizontal and vertica head turns with minimal UE support on // bars 5 reps each ; standing with EC 10 secs with UE support  prn    Gait with Head Turns Forward;2 reps   1 rep with horizontal head turns; 1 rep with vertical head turns   Marching Foam/compliant surface;Static;10 reps    Other Standing Exercises Comments pt performed marching on incline - EO 5 reps; EO with horizontal head turns and 5 reps with vertical head turns; EC 5 reps no head turns, then 5 reps horizontal and 5 reps vertical head turns with min to mod assist               PT Short Term Goals -  06/14/20 1210      PT SHORT TERM GOAL #1   Title independent with initial HEP    Time 3    Period Weeks    Status New    Target Date 06/07/20      PT SHORT TERM GOAL #2   Title Pt will demo improved mCTSIB score to at least 115    Baseline mCTSIB score 107    Time 3    Period Weeks    Status New    Target Date 06/07/20      PT SHORT TERM GOAL #3   Title Pt will report 50% improvement of his morning unsteadiness    Baseline Near falls in the morning    Time 3    Period Weeks    Status New    Target Date 06/07/20             PT Long Term Goals - 06/14/20 1211      PT LONG TERM GOAL #1   Title Pt will be independent with final HEP    Time 6    Period Weeks    Status New      PT LONG TERM GOAL #2   Title Pt will have mCTSIB of 120; able to maintain balance in all conditions    Time 6    Period Weeks    Status New      PT LONG TERM GOAL #3   Title Pt will have improved FGA score of at least 25/30 to demo decreased risk of falls    Baseline 21/30    Time 6    Period Weeks    Status New                 Plan - 06/14/20 1208    Clinical Impression Statement Pt continues to have most difficulty maintaining balance with standing on compliant surface with EC and also with head turns with EC.  Pt does well with balance activities on noncompliant surfaces and with those activities that do not oncorporate increased vestibular input.  Pt continues to demonstrate signs consistent with vestibular hypofunction but does well  with basic balance and gait.  Cont with POC.    Personal Factors and Comorbidities Age;Comorbidity 1;Comorbidity 2;Comorbidity 3+    Comorbidities paroxysmal A. fib with status post ablation x 3, hearing loss, vitamin D deficiency, rheumatoid arthritis, right bundle branch block, obstructive sleep apnea on CPAP, nonischemic cardiomyopathy, hypertension, hyperlipidemia, kidney stones, history of gastric ulcer, DVT, PVCs, borderline obesity, and history of bilat TKA and R THA.    Examination-Activity Limitations Stand;Sit;Locomotion Level;Transfers    Examination-Participation Restrictions Community Activity    Stability/Clinical Decision Making Evolving/Moderate complexity    Rehab Potential Good    PT Frequency 2x / week    PT Duration 6 weeks    PT Treatment/Interventions ADLs/Self Care Home Management;Canalith Repostioning;Electrical Stimulation;Aquatic Therapy;Moist Heat;Gait training;Stair training;Functional mobility training;Therapeutic activities;DME Instruction;Therapeutic exercise;Neuromuscular re-education;Balance training;Patient/family education;Manual techniques;Taping;Vestibular    PT Next Visit Plan Check STG's - Continue to focus on gaze stabilization, balance with vision removed on compliant surfaces, and vestibular integration. Work on dynamic gait and weight shifting (consider walking on foam).    PT Home Exercise Plan Access Code: [email or web address]; added hip strengthening with green theraband - Medbridge [email or web address]    Consulted and Agree with Plan of Care Patient           Patient will benefit from skilled therapeutic intervention in order to improve the following deficits and  impairments:  Difficulty walking,Dizziness,Decreased activity tolerance,Decreased balance,Decreased mobility,Decreased strength  Visit Diagnosis: Unsteadiness on feet  Other abnormalities of gait and mobility     Problem List Patient Active Problem List   Diagnosis Date Noted  . Secondary  hypercoagulable state ([place]) 03/28/2020  . Malignant neoplasm of prostate ([place]) 09/11/2018  . Enlarged prostate with urinary obstruction 07/06/2018  . Gastrocnemius strain, right, initial encounter 12/10/2017  . Baker's cyst of knee, right 12/10/2017  . Obesity 03/24/2017  . Paroxysmal atrial fibrillation ([place]) 12/05/2016  . Near syncope 04/20/2016  . Leg hematoma, left, initial encounter 04/20/2016  . Bleeding   . Ecchymosis   . Left hamstring muscle strain   . Muscle tear   . RBBB 08/30/2015  . Chronic systolic dysfunction of left ventricle 07/11/2014  . OSA (obstructive sleep apnea) 04/07/2013  . Multinodular goiter 01/20/2013  . Cervical spondylosis without myelopathy 01/18/2013  . Atrial fibrillation with RVR ([place]) 10/27/2012  . PAF (paroxysmal atrial fibrillation) ([place]) 05/28/2012  . Left ventricular dysfunction 12/17/2011  . Low back pain 09/11/2011  . Fatigue 05/22/2011  . PVC (premature ventricular contraction) 01/18/2011  . Persistent atrial fibrillation ([place]) 01/12/2011  . Hyperlipidemia 08/02/2010  . Hypertension 08/02/2010  . Osteoarthritis 08/02/2010  . BPH (benign prostatic hyperplasia) 08/02/2010  . GERD (gastroesophageal reflux disease) 08/02/2010  . h/o Atrial flutter [facility]) s/p ablation 2012     [place] Rico Suzanne, PT 06/14/2020, 12:11 PM  [facility] [address] [place], [place], [address] Phone: [phone]   Fax:  [phone]  Name: Connor [name] MRN: [id number] Date of Birth: 02/15/1934

## 2020-06-15 ENCOUNTER — Encounter: Payer: Self-pay | Admitting: Physical Therapy

## 2020-06-15 ENCOUNTER — Ambulatory Visit: Payer: Medicare PPO | Admitting: Physical Therapy

## 2020-06-15 ENCOUNTER — Other Ambulatory Visit: Payer: Self-pay

## 2020-06-15 DIAGNOSIS — R2681 Unsteadiness on feet: Secondary | ICD-10-CM

## 2020-06-15 DIAGNOSIS — R2689 Other abnormalities of gait and mobility: Secondary | ICD-10-CM | POA: Diagnosis not present

## 2020-06-15 DIAGNOSIS — R42 Dizziness and giddiness: Secondary | ICD-10-CM | POA: Diagnosis not present

## 2020-06-15 DIAGNOSIS — R262 Difficulty in walking, not elsewhere classified: Secondary | ICD-10-CM | POA: Diagnosis not present

## 2020-06-16 NOTE — Therapy (Signed)
[facility] [address] [place], [place], [address] Phone: [phone]   Fax:  [phone]  Physical Therapy Treatment  Patient Details  Name: Connor [name] MRN: [id number] Date of Birth: Dec 19, 1933 Referring Provider (PT): Star Age, MD   Encounter Date: 06/15/2020   PT End of Session - 06/16/20 1310    Visit Number 8    Number of Visits 13    Date for PT Re-Evaluation 06/28/20    Authorization Type [employer] -- paper Josem Kaufmann requested 05/17/20; 05-17-20 - 06-28-20    Authorization - Visit Number 8    Authorization - Number of Visits 12    Progress Note Due on Visit 10    PT Start Time 1104    PT Stop Time 1146    PT Time Calculation (min) 42 min    Activity Tolerance Patient tolerated treatment well    Behavior During Therapy [facility] for tasks assessed/performed           Past Medical History:  Diagnosis Date  . Benign localized prostatic hyperplasia with lower urinary tract symptoms (LUTS)    urologist-- dr Diona Fanti  . Chronic anticoagulation    Xarelto for afib  . Chronic constipation   . Chronic cystitis   . Gross hematuria   . History of DVT of lower extremity 06/08/2010   right femoral dvt dx post op total hip 05-08-2010  . History of gastric ulcer    remote  . History of kidney stones    one stone. remote hx  . Hyperlipidemia   . Hypertension    Dx by Dr. Lance Muss around age  25   . NICM (nonischemic cardiomyopathy) ([place])    per echo 06-11-2017,  ef 45-50% with diffuse hypokinesis,  G1DD  . OSA on CPAP 2017   compliant with CPAP.  managed by Dr Elsworth Soho.  . Osteoarthritis   . Other urethral stricture, male, meatal    chronic;  pt self dilates  . Paroxysmal atrial fibrillation [facility]) cardiologist-- dr Rayann Heman   first dx 2009/   a. documented by Dr Barrett Shell on office EKG 9/12. b. maintained on Tikosyn and Xarelto. c. s/p DCCV 's.  d.  x3  EP w/ ablation atrial fib last one 12-05-2016  .  Prediabetes   . Premature ventricular contraction   . PVC's (premature ventricular contractions)   . RA (rheumatoid arthritis) [facility])    rheumatologist--- Dr Page Spiro,  treated with Humira  . RBBB (right bundle branch block)    Hx of a fib, ablation Aug 2018  . Rheumatoid arthritis ([place])   . Vitamin D deficiency   . Wears hearing aid in both ears     Past Surgical History:  Procedure Laterality Date  . ABLATION OF DYSRHYTHMIC FOCUS  08/29/2015  . ATRIAL FIBRILLATION ABLATION  12/05/2016  . ATRIAL FIBRILLATION ABLATION N/A 12/05/2016   Procedure: Atrial Fibrillation Ablation;  Surgeon: [name], MD;  Location: [place] CV LAB;  Service: Cardiovascular;  Laterality: N/A;  . ATRIAL FIBRILLATION ABLATION N/A 02/29/2020   Procedure: ATRIAL FIBRILLATION ABLATION;  Surgeon: [name], MD;  Location: [place] CV LAB;  Service: Cardiovascular;  Laterality: N/A;  . ATRIAL FLUTTER ABLATION  09/2010   by Greggory Brandy  . BUNIONECTOMY Right 1990s  . CARDIOVERSION N/A 10/28/2012   Procedure: CARDIOVERSION;  Surgeon: Burnell Blanks, MD;  Location: [place];  Service: Cardiovascular;  Laterality: N/A;  . CATARACT EXTRACTION W/ INTRAOCULAR LENS  IMPLANT, BILATERAL  2019  .  CYSTOSCOPY WITH BIOPSY N/A 09/03/2018   Procedure: CYSTOSCOPY WITH FULGURATION [id number];  Surgeon: Franchot Gallo, MD;  Location: [facility];  Service: Urology;  Laterality: N/A;  30 MINS  . ELECTROPHYSIOLOGIC STUDY N/A 08/29/2015   Procedure: Atrial Fibrillation Ablation;  Surgeon: [name], MD;  Location: [place] CV LAB;  Service: Cardiovascular;  Laterality: N/A;  . KNEE SURGERY Bilateral x3   1960s & 1970s   open  . LEFT HEART CATHETERIZATION WITH CORONARY ANGIOGRAM N/A 06/07/2011   Procedure: LEFT HEART CATHETERIZATION WITH CORONARY ANGIOGRAM;  Surgeon: Peter M Martinique, MD;  Location: [facility] [place] CATH LAB;  Service: Cardiovascular;  Laterality: N/A;    Normal coronary arteries,  low normal LVF  . TONSILLECTOMY AND  ADENOIDECTOMY  age 50  . TOTAL HIP ARTHROPLASTY Right 06/04/10    dr Noemi Chapel  @[facility]   . TOTAL KNEE ARTHROPLASTY Bilateral right 1995;  left 1997  . TRANSURETHRAL RESECTION OF PROSTATE  06-29-2002   dr humphries  @[facility]   . TRANSURETHRAL RESECTION OF PROSTATE N/A 07/06/2018   Procedure: TRANSURETHRAL RESECTION OF THE PROSTATE (TURP);  Surgeon: Franchot Gallo, MD;  [facility];  Service: Urology;  Laterality: N/A;  45 MINS    There were no vitals filed for this visit.   Subjective Assessment - 06/16/20 1305    Subjective Pt reports no changes or problems    Patient Stated Goals Improve morning dizziness    Currently in Pain? No/denies                             [facility] Adult PT Treatment/Exercise - 06/16/20 0001      High Level Balance   High Level Balance Activities Backward walking;Sudden stops;Head turns;Marching forwards      Knee/Hip Exercises: Stretches   Active Hamstring Stretch Both;1 rep;30 seconds               Balance Exercises - 06/16/20 0001      Balance Exercises: Standing   Standing Eyes Closed Wide (BOA);Head turns;Foam/compliant surface;5 reps   horizontal and vertical head turns   Rockerboard Anterior/posterior;EO;EC;10 reps;UE support;Head turns;Lateral   performed with rocker board on treadmill for use of the bars for UE support   Marching Foam/compliant surface;Static;10 reps    Other Standing Exercises pt performed alternate tap downs from pillows to floor with head turn to 1 side only - 5 reps each leg:  performed alternate stepping forward/back and down/ back  5 reps each leg    Other Standing Exercises Comments pt performed marching on incline - EO 5 reps; EO with horizontal head turns and 5 reps with vertical head turns; EC 5 reps no head turns, then 5 reps horizontal and 5 reps vertical head turns with min to mod assist          Pt performed forward, back and alternating kicks 5 reps on each leg standing on blue  mat inside // bars for UE support prn  Pt performed marching forward/backward 3 reps on blue mat inside // bars  Pt performed SLS activity standing on blue mat - touching top of cones (4); then standing on floor - performed tipping cones over  and standing upright - 2 reps - with CGA for recovery of LOB     PT Short Term Goals - 06/16/20 1323      PT SHORT TERM GOAL #1   Title independent with initial HEP    Time 3  Period Weeks    Status New    Target Date 06/07/20      PT SHORT TERM GOAL #2   Title Pt will demo improved mCTSIB score to at least 115    Baseline mCTSIB score 107    Time 3    Period Weeks    Status New    Target Date 06/07/20      PT SHORT TERM GOAL #3   Title Pt will report 50% improvement of his morning unsteadiness    Baseline Near falls in the morning    Time 3    Period Weeks    Status New    Target Date 06/07/20             PT Long Term Goals - 06/16/20 1323      PT LONG TERM GOAL #1   Title Pt will be independent with final HEP    Time 6    Period Weeks    Status New      PT LONG TERM GOAL #2   Title Pt will have mCTSIB of 120; able to maintain balance in all conditions    Time 6    Period Weeks    Status New      PT LONG TERM GOAL #3   Title Pt will have improved FGA score of at least 25/30 to demo decreased risk of falls    Baseline 21/30    Time 6    Period Weeks    Status New                 Plan - 06/16/20 1311    Clinical Impression Statement Pt continues to demonstrate good balance with standing activities on noncompliant surfaces; pt does have difficulty performing higher level balance activities such as SLS and tandem stance; pt continues to deny having any dizziness. Cont with POC.    Personal Factors and Comorbidities Age;Comorbidity 1;Comorbidity 2;Comorbidity 3+    Comorbidities paroxysmal A. fib with status post ablation x 3, hearing loss, vitamin D deficiency, rheumatoid arthritis, right bundle branch  block, obstructive sleep apnea on CPAP, nonischemic cardiomyopathy, hypertension, hyperlipidemia, kidney stones, history of gastric ulcer, DVT, PVCs, borderline obesity, and history of bilat TKA and R THA.    Examination-Activity Limitations Stand;Sit;Locomotion Level;Transfers    Examination-Participation Restrictions Community Activity    Stability/Clinical Decision Making Evolving/Moderate complexity    Rehab Potential Good    PT Frequency 2x / week    PT Duration 6 weeks    PT Treatment/Interventions ADLs/Self Care Home Management;Canalith Repostioning;Electrical Stimulation;Aquatic Therapy;Moist Heat;Gait training;Stair training;Functional mobility training;Therapeutic activities;DME Instruction;Therapeutic exercise;Neuromuscular re-education;Balance training;Patient/family education;Manual techniques;Taping;Vestibular    PT Next Visit Plan Check STG's - Continue to focus on gaze stabilization, balance with vision removed on compliant surfaces, and vestibular integration. Work on dynamic gait and weight shifting (consider walking on foam).    PT Home Exercise Plan Access Code: [email or web address]; added hip strengthening with green theraband - Medbridge [email or web address]    Consulted and Agree with Plan of Care Patient           Patient will benefit from skilled therapeutic intervention in order to improve the following deficits and impairments:  Difficulty walking,Dizziness,Decreased activity tolerance,Decreased balance,Decreased mobility,Decreased strength  Visit Diagnosis: Unsteadiness on feet  Other abnormalities of gait and mobility     Problem List Patient Active Problem List   Diagnosis Date Noted  . Secondary hypercoagulable state ([place]) 03/28/2020  . Malignant neoplasm of prostate ([place]) 09/11/2018  . Enlarged  prostate with urinary obstruction 07/06/2018  . Gastrocnemius strain, right, initial encounter 12/10/2017  . Baker's cyst of knee, right 12/10/2017  . Obesity 03/24/2017  .  Paroxysmal atrial fibrillation ([place]) 12/05/2016  . Near syncope 04/20/2016  . Leg hematoma, left, initial encounter 04/20/2016  . Bleeding   . Ecchymosis   . Left hamstring muscle strain   . Muscle tear   . RBBB 08/30/2015  . Chronic systolic dysfunction of left ventricle 07/11/2014  . OSA (obstructive sleep apnea) 04/07/2013  . Multinodular goiter 01/20/2013  . Cervical spondylosis without myelopathy 01/18/2013  . Atrial fibrillation with RVR ([place]) 10/27/2012  . PAF (paroxysmal atrial fibrillation) ([place]) 05/28/2012  . Left ventricular dysfunction 12/17/2011  . Low back pain 09/11/2011  . Fatigue 05/22/2011  . PVC (premature ventricular contraction) 01/18/2011  . Persistent atrial fibrillation ([place]) 01/12/2011  . Hyperlipidemia 08/02/2010  . Hypertension 08/02/2010  . Osteoarthritis 08/02/2010  . BPH (benign prostatic hyperplasia) 08/02/2010  . GERD (gastroesophageal reflux disease) 08/02/2010  . h/o Atrial flutter [facility]) s/p ablation 2012     Evanny Ellerbe Suzanne, PT 06/16/2020, 1:24 PM  [facility] [address] [place], [place], [address] Phone: [phone]   Fax:  [phone]  Name: Connor [name] MRN: [id number] Date of Birth: 1933/11/02

## 2020-06-19 DIAGNOSIS — B351 Tinea unguium: Secondary | ICD-10-CM | POA: Diagnosis not present

## 2020-06-19 DIAGNOSIS — E1351 Other specified diabetes mellitus with diabetic peripheral angiopathy without gangrene: Secondary | ICD-10-CM | POA: Diagnosis not present

## 2020-06-20 ENCOUNTER — Ambulatory Visit: Payer: Medicare PPO | Admitting: Physical Therapy

## 2020-06-20 ENCOUNTER — Other Ambulatory Visit: Payer: Self-pay

## 2020-06-20 DIAGNOSIS — R2681 Unsteadiness on feet: Secondary | ICD-10-CM | POA: Diagnosis not present

## 2020-06-20 DIAGNOSIS — R42 Dizziness and giddiness: Secondary | ICD-10-CM | POA: Diagnosis not present

## 2020-06-20 DIAGNOSIS — R262 Difficulty in walking, not elsewhere classified: Secondary | ICD-10-CM | POA: Diagnosis not present

## 2020-06-20 DIAGNOSIS — R2689 Other abnormalities of gait and mobility: Secondary | ICD-10-CM | POA: Diagnosis not present

## 2020-06-21 NOTE — Therapy (Signed)
[facility] [address] [place], [place], [address] Phone: [phone]   Fax:  [phone]  Physical Therapy Treatment  Patient Details  Name: Connor [name] MRN: [id number] Date of Birth: October 29, 1933 Referring Provider (PT): Star Age, MD   Encounter Date: 06/20/2020   PT End of Session - 06/21/20 1737    Visit Number 9    Number of Visits 13    Date for PT Re-Evaluation 06/28/20    Authorization Type [employer] -- paper Josem Kaufmann requested 05/17/20; 05-17-20 - 06-28-20    Authorization - Visit Number 9    Authorization - Number of Visits 12    Progress Note Due on Visit 10    PT Start Time 1105    PT Stop Time 1150    PT Time Calculation (min) 45 min    Equipment Utilized During Treatment Gait belt    Activity Tolerance Patient tolerated treatment well    Behavior During Therapy [facility] for tasks assessed/performed           Past Medical History:  Diagnosis Date  . Benign localized prostatic hyperplasia with lower urinary tract symptoms (LUTS)    urologist-- dr Diona Fanti  . Chronic anticoagulation    Xarelto for afib  . Chronic constipation   . Chronic cystitis   . Gross hematuria   . History of DVT of lower extremity 06/08/2010   right femoral dvt dx post op total hip 05-08-2010  . History of gastric ulcer    remote  . History of kidney stones    one stone. remote hx  . Hyperlipidemia   . Hypertension    Dx by Dr. Lance Muss around age  46   . NICM (nonischemic cardiomyopathy) ([place])    per echo 06-11-2017,  ef 45-50% with diffuse hypokinesis,  G1DD  . OSA on CPAP 2017   compliant with CPAP.  managed by Dr Elsworth Soho.  . Osteoarthritis   . Other urethral stricture, male, meatal    chronic;  pt self dilates  . Paroxysmal atrial fibrillation [facility]) cardiologist-- dr Rayann Heman   first dx 2009/   a. documented by Dr Barrett Shell on office EKG 9/12. b. maintained on Tikosyn and Xarelto. c. s/p DCCV 's.  d.  x3  EP w/  ablation atrial fib last one 12-05-2016  . Prediabetes   . Premature ventricular contraction   . PVC's (premature ventricular contractions)   . RA (rheumatoid arthritis) [facility])    rheumatologist--- Dr Page Spiro,  treated with Humira  . RBBB (right bundle branch block)    Hx of a fib, ablation Aug 2018  . Rheumatoid arthritis ([place])   . Vitamin D deficiency   . Wears hearing aid in both ears     Past Surgical History:  Procedure Laterality Date  . ABLATION OF DYSRHYTHMIC FOCUS  08/29/2015  . ATRIAL FIBRILLATION ABLATION  12/05/2016  . ATRIAL FIBRILLATION ABLATION N/A 12/05/2016   Procedure: Atrial Fibrillation Ablation;  Surgeon: [name], MD;  Location: [place] CV LAB;  Service: Cardiovascular;  Laterality: N/A;  . ATRIAL FIBRILLATION ABLATION N/A 02/29/2020   Procedure: ATRIAL FIBRILLATION ABLATION;  Surgeon: [name], MD;  Location: [place] CV LAB;  Service: Cardiovascular;  Laterality: N/A;  . ATRIAL FLUTTER ABLATION  09/2010   by Greggory Brandy  . BUNIONECTOMY Right 1990s  . CARDIOVERSION N/A 10/28/2012   Procedure: CARDIOVERSION;  Surgeon: Burnell Blanks, MD;  Location: [facility];  Service: Cardiovascular;  Laterality: N/A;  . CATARACT  EXTRACTION W/ INTRAOCULAR LENS  IMPLANT, BILATERAL  2019  . CYSTOSCOPY WITH BIOPSY N/A 09/03/2018   Procedure: CYSTOSCOPY WITH FULGURATION [id number];  Surgeon: Franchot Gallo, MD;  [facility];  Service: Urology;  Laterality: N/A;  30 MINS  . ELECTROPHYSIOLOGIC STUDY N/A 08/29/2015   Procedure: Atrial Fibrillation Ablation;  Surgeon: [name], MD;  Location: [place] CV LAB;  Service: Cardiovascular;  Laterality: N/A;  . KNEE SURGERY Bilateral x3   1960s & 1970s   open  . LEFT HEART CATHETERIZATION WITH CORONARY ANGIOGRAM N/A 06/07/2011   Procedure: LEFT HEART CATHETERIZATION WITH CORONARY ANGIOGRAM;  Surgeon: Peter M Martinique, MD;  Location: [facility] CATH LAB;  Service: Cardiovascular;  Laterality: N/A;    Normal coronary arteries,   low normal LVF  . TONSILLECTOMY AND ADENOIDECTOMY  age 87  . TOTAL HIP ARTHROPLASTY Right 06/04/10    dr Noemi Chapel  _0   . TOTAL KNEE ARTHROPLASTY Bilateral right 1995;  left 1997  . TRANSURETHRAL RESECTION OF PROSTATE  06-29-2002   dr humphries  _1   . TRANSURETHRAL RESECTION OF PROSTATE N/A 07/06/2018   Procedure: TRANSURETHRAL RESECTION OF THE PROSTATE (TURP);  Surgeon: Franchot Gallo, MD;  Location: [facility];  Service: Urology;  Laterality: N/A;  45 MINS    There were no vitals filed for this visit.   Subjective Assessment - 06/20/20 1109    Subjective Pt reports he had a little fall this morning - went to bathroom - stood up and started falling backward as he was pulling up his pants, grabbed towel bar and it broke and fell backwards onto commode - did not get hurt    Patient Stated Goals Improve morning dizziness    Currently in Pain? No/denies                                  Balance Exercises - 06/21/20 0001      Balance Exercises: Standing   Standing Eyes Closed Wide (BOA);Head turns;Foam/compliant surface;5 reps   horizontal and vertical head turns   Rockerboard Anterior/posterior;EO;EC;10 reps;UE support;Head turns;Lateral   performed with rocker board on treadmill for use of the bars for UE support   Balance Beam standing on blue balance beam - alternate lifting UE's for improved core stabilization;  pt performed horizontal and vertica head turns with minimal UE support on // bars 5 reps each ; standing with EC 10 secs with UE support prn    Step Over Hurdles / Cones 5 reps - stepping over orange hurdle with UE support prn    Marching Foam/compliant surface;Static;10 reps    Other Standing Exercises pt performed alternate tap downs from pillows to floor with head turn to 1 side only - 5 reps each leg:  performed alternate stepping forward/back and down/ back  5 reps each leg    Other Standing Exercises Comments pt performed marching on  blue mat on incline - EO 5 reps; EO with horizontal head turns and 5 reps with vertical head turns; EC 5 reps no head turns, then 5 reps horizontal and 5 reps vertical head turns with min to mod assist               PT Short Term Goals - 06/21/20 1741      PT SHORT TERM GOAL #1   Title independent with initial HEP    Baseline met 06-20-20    Time 3    Period Weeks  Status Achieved    Target Date 06/07/20      PT SHORT TERM GOAL #2   Title Pt will demo improved mCTSIB score to at least 115    Baseline mCTSIB score 107    Time 3    Period Weeks    Status New    Target Date 06/07/20      PT SHORT TERM GOAL #3   Title Pt will report 50% improvement of his morning unsteadiness    Baseline Near falls in the morning    Time 3    Period Weeks    Status New    Target Date 06/07/20             PT Long Term Goals - 06/21/20 1742      PT LONG TERM GOAL #1   Title Pt will be independent with final HEP    Time 6    Period Weeks    Status New      PT LONG TERM GOAL #2   Title Pt will have mCTSIB of 120; able to maintain balance in all conditions    Time 6    Period Weeks    Status New      PT LONG TERM GOAL #3   Title Pt will have improved FGA score of at least 25/30 to demo decreased risk of falls    Baseline 21/30    Time 6    Period Weeks    Status New                 Plan - 06/21/20 1737    Clinical Impression Statement Pt continues to demonstrate good standing balance with standing on noncompliant surface with EO; pt has most difficulty with standing with EC on compliant surface, indicative of decreased vestibular input in maintaining balance.  Pt did have some unsteadiness with performing a quick turn but was able to recover LOB with CGA.  Cont with POC.    Personal Factors and Comorbidities Age;Comorbidity 1;Comorbidity 2;Comorbidity 3+    Comorbidities paroxysmal A. fib with status post ablation x 3, hearing loss, vitamin D deficiency, rheumatoid  arthritis, right bundle branch block, obstructive sleep apnea on CPAP, nonischemic cardiomyopathy, hypertension, hyperlipidemia, kidney stones, history of gastric ulcer, DVT, PVCs, borderline obesity, and history of bilat TKA and R THA.    Examination-Activity Limitations Stand;Sit;Locomotion Level;Transfers    Examination-Participation Restrictions Community Activity    Stability/Clinical Decision Making Evolving/Moderate complexity    Rehab Potential Good    PT Frequency 2x / week    PT Duration 6 weeks    PT Treatment/Interventions ADLs/Self Care Home Management;Canalith Repostioning;Electrical Stimulation;Aquatic Therapy;Moist Heat;Gait training;Stair training;Functional mobility training;Therapeutic activities;DME Instruction;Therapeutic exercise;Neuromuscular re-education;Balance training;Patient/family education;Manual techniques;Taping;Vestibular    PT Next Visit Plan Check STG's - Continue to focus on gaze stabilization, balance with vision removed on compliant surfaces, and vestibular integration. Work on dynamic gait and weight shifting (consider walking on foam).    PT Home Exercise Plan Access Code: [email or web address]; added hip strengthening with green theraband - Medbridge [email or web address]    Consulted and Agree with Plan of Care Patient           Patient will benefit from skilled therapeutic intervention in order to improve the following deficits and impairments:  Difficulty walking,Dizziness,Decreased activity tolerance,Decreased balance,Decreased mobility,Decreased strength  Visit Diagnosis: Unsteadiness on feet  Other abnormalities of gait and mobility     Problem List Patient Active Problem List   Diagnosis Date Noted  . Secondary hypercoagulable  state ([place]) 03/28/2020  . Malignant neoplasm of prostate ([place]) 09/11/2018  . Enlarged prostate with urinary obstruction 07/06/2018  . Gastrocnemius strain, right, initial encounter 12/10/2017  . Baker's cyst of knee, right 12/10/2017  .  Obesity 03/24/2017  . Paroxysmal atrial fibrillation ([place]) 12/05/2016  . Near syncope 04/20/2016  . Leg hematoma, left, initial encounter 04/20/2016  . Bleeding   . Ecchymosis   . Left hamstring muscle strain   . Muscle tear   . RBBB 08/30/2015  . Chronic systolic dysfunction of left ventricle 07/11/2014  . OSA (obstructive sleep apnea) 04/07/2013  . Multinodular goiter 01/20/2013  . Cervical spondylosis without myelopathy 01/18/2013  . Atrial fibrillation with RVR ([place]) 10/27/2012  . PAF (paroxysmal atrial fibrillation) ([place]) 05/28/2012  . Left ventricular dysfunction 12/17/2011  . Low back pain 09/11/2011  . Fatigue 05/22/2011  . PVC (premature ventricular contraction) 01/18/2011  . Persistent atrial fibrillation ([place]) 01/12/2011  . Hyperlipidemia 08/02/2010  . Hypertension 08/02/2010  . Osteoarthritis 08/02/2010  . BPH (benign prostatic hyperplasia) 08/02/2010  . GERD (gastroesophageal reflux disease) 08/02/2010  . h/o Atrial flutter [facility]) s/p ablation 2012     Abdurrahman Petersheim Suzanne, PT 06/21/2020, 5:43 PM  [facility] [address] [place], [place], [address] Phone: [phone]   Fax:  [phone]  Name: Connor [name] MRN: [id number] Date of Birth: 12-12-1933

## 2020-06-22 ENCOUNTER — Other Ambulatory Visit: Payer: Self-pay

## 2020-06-22 ENCOUNTER — Ambulatory Visit: Payer: Medicare PPO | Admitting: Physical Therapy

## 2020-06-22 DIAGNOSIS — R2681 Unsteadiness on feet: Secondary | ICD-10-CM

## 2020-06-22 DIAGNOSIS — R42 Dizziness and giddiness: Secondary | ICD-10-CM | POA: Diagnosis not present

## 2020-06-22 DIAGNOSIS — R2689 Other abnormalities of gait and mobility: Secondary | ICD-10-CM | POA: Diagnosis not present

## 2020-06-22 DIAGNOSIS — R262 Difficulty in walking, not elsewhere classified: Secondary | ICD-10-CM | POA: Diagnosis not present

## 2020-06-23 ENCOUNTER — Encounter: Payer: Self-pay | Admitting: Physical Therapy

## 2020-06-23 NOTE — Therapy (Signed)
[facility] [address] [place], [place], [address] Phone: [phone]   Fax:  [phone]  Physical Therapy Treatment & 10th visit progress note  Reporting period:  05-17-20 - 06-22-20 See below for progress towards goals  Patient Details  Name: Connor [name] MRN: [id number] Date of Birth: 07/05/1933 Referring Provider (PT): Star Age, MD   Encounter Date: 06/22/2020   PT End of Session - 06/23/20 1509    Visit Number 10    Number of Visits 13    Date for PT Re-Evaluation 06/28/20    Authorization Type [employer] -- paper Josem Kaufmann requested 05/17/20; 05-17-20 - 06-28-20    Authorization - Visit Number 10    Authorization - Number of Visits 12    Progress Note Due on Visit 10    PT Start Time 1105    PT Stop Time 1147    PT Time Calculation (min) 42 min    Equipment Utilized During Treatment Gait belt    Activity Tolerance Patient tolerated treatment well    Behavior During Therapy [facility] for tasks assessed/performed           Past Medical History:  Diagnosis Date  . Benign localized prostatic hyperplasia with lower urinary tract symptoms (LUTS)    urologist-- dr Diona Fanti  . Chronic anticoagulation    Xarelto for afib  . Chronic constipation   . Chronic cystitis   . Gross hematuria   . History of DVT of lower extremity 06/08/2010   right femoral dvt dx post op total hip 05-08-2010  . History of gastric ulcer    remote  . History of kidney stones    one stone. remote hx  . Hyperlipidemia   . Hypertension    Dx by Dr. Lance Muss around age  79   . NICM (nonischemic cardiomyopathy) ([place])    per echo 06-11-2017,  ef 45-50% with diffuse hypokinesis,  G1DD  . OSA on CPAP 2017   compliant with CPAP.  managed by Dr Elsworth Soho.  . Osteoarthritis   . Other urethral stricture, male, meatal    chronic;  pt self dilates  . Paroxysmal atrial fibrillation [facility]) cardiologist-- dr Rayann Heman   first dx 2009/   a.  documented by Dr Barrett Shell on office EKG 9/12. b. maintained on Tikosyn and Xarelto. c. s/p DCCV 's.  d.  x3  EP w/ ablation atrial fib last one 12-05-2016  . Prediabetes   . Premature ventricular contraction   . PVC's (premature ventricular contractions)   . RA (rheumatoid arthritis) [facility])    rheumatologist--- Dr Page Spiro,  treated with Humira  . RBBB (right bundle branch block)    Hx of a fib, ablation Aug 2018  . Rheumatoid arthritis ([place])   . Vitamin D deficiency   . Wears hearing aid in both ears     Past Surgical History:  Procedure Laterality Date  . ABLATION OF DYSRHYTHMIC FOCUS  08/29/2015  . ATRIAL FIBRILLATION ABLATION  12/05/2016  . ATRIAL FIBRILLATION ABLATION N/A 12/05/2016   Procedure: Atrial Fibrillation Ablation;  Surgeon: [name], MD;  Location: [place] CV LAB;  Service: Cardiovascular;  Laterality: N/A;  . ATRIAL FIBRILLATION ABLATION N/A 02/29/2020   Procedure: ATRIAL FIBRILLATION ABLATION;  Surgeon: [name], MD;  Location: [place] CV LAB;  Service: Cardiovascular;  Laterality: N/A;  . ATRIAL FLUTTER ABLATION  09/2010   by Greggory Brandy  . BUNIONECTOMY Right 1990s  . CARDIOVERSION N/A 10/28/2012   Procedure: CARDIOVERSION;  Surgeon: Burnell Blanks, MD;  Location: [facility];  Service: Cardiovascular;  Laterality: N/A;  . CATARACT EXTRACTION W/ INTRAOCULAR LENS  IMPLANT, BILATERAL  2019  . CYSTOSCOPY WITH BIOPSY N/A 09/03/2018   Procedure: CYSTOSCOPY WITH FULGURATION [id number];  Surgeon: Franchot Gallo, MD;  [facility];  Service: Urology;  Laterality: N/A;  30 MINS  . ELECTROPHYSIOLOGIC STUDY N/A 08/29/2015   Procedure: Atrial Fibrillation Ablation;  Surgeon: [name], MD;  Location: [place] CV LAB;  Service: Cardiovascular;  Laterality: N/A;  . KNEE SURGERY Bilateral x3   1960s & 1970s   open  . LEFT HEART CATHETERIZATION WITH CORONARY ANGIOGRAM N/A 06/07/2011   Procedure: LEFT HEART CATHETERIZATION WITH CORONARY ANGIOGRAM;  Surgeon: Peter M  Martinique, MD;  Location: [facility] CATH LAB;  Service: Cardiovascular;  Laterality: N/A;    Normal coronary arteries,  low normal LVF  . TONSILLECTOMY AND ADENOIDECTOMY  age 46  . TOTAL HIP ARTHROPLASTY Right 06/04/10    dr Noemi Chapel  '@[facility]'   . TOTAL KNEE ARTHROPLASTY Bilateral right 1995;  left 1997  . TRANSURETHRAL RESECTION OF PROSTATE  06-29-2002   dr humphries  '@[facility]'   . TRANSURETHRAL RESECTION OF PROSTATE N/A 07/06/2018   Procedure: TRANSURETHRAL RESECTION OF THE PROSTATE (TURP);  Surgeon: Franchot Gallo, MD;  Location: [facility];  Service: Urology;  Laterality: N/A;  45 MINS    There were no vitals filed for this visit.   Subjective Assessment - 06/23/20 1507    Subjective Pt reports no changes since previous visit this week    Patient Stated Goals Improve morning dizziness    Currently in Pain? No/denies                                  Balance Exercises - 06/23/20 0001      Balance Exercises: Standing   Standing Eyes Opened Narrow base of support (BOS);Foam/compliant surface;Head turns;5 reps   horizontal and vertical   Standing Eyes Closed Wide (BOA);Head turns;Foam/compliant surface;5 reps   horizontal and vertical head turns   Rockerboard Anterior/posterior;EO;EC;10 reps;UE support;Head turns;Lateral   performed with rocker board on treadmill for use of the bars for UE support   Marching Foam/compliant surface;Static;10 reps    Other Standing Exercises pt performed alternate tap downs from pillows to floor with head turn to 1 side only - 5 reps each leg:  performed alternate stepping forward/back and down/ back  5 reps each leg    Other Standing Exercises Comments pt performed marching on blue mat on incline - EO 5 reps; EO with horizontal head turns and 5 reps with vertical head turns; EC 5 reps no head turns, then 5 reps horizontal and 5 reps vertical head turns with min to mod assist               PT Short Term Goals - 06/23/20 1511      PT  SHORT TERM GOAL #1   Title independent with initial HEP    Baseline met 06-20-20    Time 3    Period Weeks    Status Achieved    Target Date 06/07/20      PT SHORT TERM GOAL #2   Title Pt will demo improved mCTSIB score to at least 115    Baseline mCTSIB score 107    Time 3    Period Weeks    Status Partially Met    Target Date 06/07/20  PT SHORT TERM GOAL #3   Title Pt will report 50% improvement of his morning unsteadiness    Baseline balance and vertigo vary - pt had fall earlier this week when donning clothes after using bathroom  - 06-22-20    Time 3    Period Weeks    Status Partially Met    Target Date 06/07/20             PT Long Term Goals - 06/23/20 1514      PT LONG TERM GOAL #1   Title Pt will be independent with final HEP    Time 6    Period Weeks    Status New      PT LONG TERM GOAL #2   Title Pt will have mCTSIB of 120; able to maintain balance in all conditions    Time 6    Period Weeks    Status New      PT LONG TERM GOAL #3   Title Pt will have improved FGA score of at least 25/30 to demo decreased risk of falls    Baseline 21/30    Time 6    Period Weeks    Status New                 Plan - 06/23/20 1510    Clinical Impression Statement This 10th visit progress note covers dates 05-17-20 - 06-22-20.  Pt has met STG #1 and partially met STG's #2 and 3; pt continues to have most difficulty maintaining balance on compliant surface with EC, indicative of decreased vestibular input in maintaining balance but demonstrates very good balance with EO with standing on noncompliant surfaces. Plan D/C next session.    Personal Factors and Comorbidities Age;Comorbidity 1;Comorbidity 2;Comorbidity 3+    Comorbidities paroxysmal A. fib with status post ablation x 3, hearing loss, vitamin D deficiency, rheumatoid arthritis, right bundle branch block, obstructive sleep apnea on CPAP, nonischemic cardiomyopathy, hypertension, hyperlipidemia, kidney  stones, history of gastric ulcer, DVT, PVCs, borderline obesity, and history of bilat TKA and R THA.    Examination-Activity Limitations Stand;Sit;Locomotion Level;Transfers    Examination-Participation Restrictions Community Activity    Stability/Clinical Decision Making Evolving/Moderate complexity    Rehab Potential Good    PT Frequency 2x / week    PT Duration 6 weeks    PT Treatment/Interventions ADLs/Self Care Home Management;Canalith Repostioning;Electrical Stimulation;Aquatic Therapy;Moist Heat;Gait training;Stair training;Functional mobility training;Therapeutic activities;DME Instruction;Therapeutic exercise;Neuromuscular re-education;Balance training;Patient/family education;Manual techniques;Taping;Vestibular    PT Next Visit Plan Check LTG's - Continue to focus on gaze stabilization, balance with vision removed on compliant surfaces, and vestibular integration. Work on dynamic gait and weight shifting (consider walking on foam).    PT Home Exercise Plan Access Code: [email or web address]; added hip strengthening with green theraband - Medbridge [email or web address]    Consulted and Agree with Plan of Care Patient           Patient will benefit from skilled therapeutic intervention in order to improve the following deficits and impairments:  Difficulty walking,Dizziness,Decreased activity tolerance,Decreased balance,Decreased mobility,Decreased strength  Visit Diagnosis: Unsteadiness on feet  Other abnormalities of gait and mobility     Problem List Patient Active Problem List   Diagnosis Date Noted  . Secondary hypercoagulable state ([place]) 03/28/2020  . Malignant neoplasm of prostate ([place]) 09/11/2018  . Enlarged prostate with urinary obstruction 07/06/2018  . Gastrocnemius strain, right, initial encounter 12/10/2017  . Baker's cyst of knee, right 12/10/2017  . Obesity 03/24/2017  . Paroxysmal atrial fibrillation (  [place]) 12/05/2016  . Near syncope 04/20/2016  . Leg hematoma, left, initial  encounter 04/20/2016  . Bleeding   . Ecchymosis   . Left hamstring muscle strain   . Muscle tear   . RBBB 08/30/2015  . Chronic systolic dysfunction of left ventricle 07/11/2014  . OSA (obstructive sleep apnea) 04/07/2013  . Multinodular goiter 01/20/2013  . Cervical spondylosis without myelopathy 01/18/2013  . Atrial fibrillation with RVR ([place]) 10/27/2012  . PAF (paroxysmal atrial fibrillation) ([place]) 05/28/2012  . Left ventricular dysfunction 12/17/2011  . Low back pain 09/11/2011  . Fatigue 05/22/2011  . PVC (premature ventricular contraction) 01/18/2011  . Persistent atrial fibrillation ([place]) 01/12/2011  . Hyperlipidemia 08/02/2010  . Hypertension 08/02/2010  . Osteoarthritis 08/02/2010  . BPH (benign prostatic hyperplasia) 08/02/2010  . GERD (gastroesophageal reflux disease) 08/02/2010  . h/o Atrial flutter [facility]) s/p ablation 2012     Chau Sawin Suzanne, PT 06/23/2020, 3:24 PM  [facility] [address] [place], [place], [address] Phone: [phone]   Fax:  [phone]  Name: Connor [name] MRN: [id number] Date of Birth: 1933-12-06

## 2020-06-26 ENCOUNTER — Encounter: Payer: Self-pay | Admitting: Pulmonary Disease

## 2020-06-26 ENCOUNTER — Ambulatory Visit: Payer: Medicare PPO | Admitting: Pulmonary Disease

## 2020-06-26 ENCOUNTER — Other Ambulatory Visit: Payer: Self-pay

## 2020-06-26 VITALS — BP 132/80 | HR 71 | Temp 97.8°F | Ht 68.0 in | Wt 224.8 lb

## 2020-06-26 DIAGNOSIS — I4891 Unspecified atrial fibrillation: Secondary | ICD-10-CM | POA: Diagnosis not present

## 2020-06-26 DIAGNOSIS — G4733 Obstructive sleep apnea (adult) (pediatric): Secondary | ICD-10-CM

## 2020-06-26 NOTE — Progress Notes (Signed)
   Subjective:    Patient ID: Connor [name], male    DOB: [birth year], 87 y.o.   MRN: [id number]  HPI  87 yo forFU ofobstructive sleep apnea PMH-recurrent atrial fib. Status post ablation 10/2012 and again 06/2015, 12/2016, 02/2020 DVT after right hip placement February 2012 Nasal injury as a child with chronic nasal blockage - rheumatoid arthritis and is maintained on Humira q week and prednisone 5 mg daily.   -prostate cancer and underwent prostatectomy and IMRT, last 12/2018.  CT biopsy of pelvic lymph node showed metastatic adenocarcinoma  Annual follow-up, he has developed vertigo and is undergoing rehab with neurology.  He completed his last shot for prostate cancer.  Underwent another ablation for atrial fibrillation 02/2020 He had slacked off on his CPAP usage but after visiting his cardiologist, has increased his usage.  This is reflected on his download today which shows appropriate usage over the last few weeks on CPAP 11 cm with good control of events and minimal leak.  He continues to use a full facemask and complains about mild leak  Significant tests/ events reviewed  PSG 05/2013 Moderate OSA with AHI 21/h 07/2013 CPAP of 11 centimeters with a large full face mask  Review of Systems neg for any significant sore throat, dysphagia, itching, sneezing, nasal congestion or excess/ purulent secretions, fever, chills, sweats, unintended wt loss, pleuritic or exertional cp, hempoptysis, orthopnea pnd or change in chronic leg swelling. Also denies presyncope, palpitations, heartburn, abdominal pain, nausea, vomiting, diarrhea or change in bowel or urinary habits, dysuria,hematuria, rash, arthralgias, visual complaints, headache, numbness weakness or ataxia.     Objective:   Physical Exam  Gen. Pleasant, well-nourished, in no distress ENT - no thrush, no pallor/icterus,no post nasal drip Neck: No JVD, no thyromegaly, no carotid bruits Lungs: no use of accessory muscles, no  dullness to percussion, clear without rales or rhonchi  Cardiovascular: Rhythm regular, heart sounds  normal, no murmurs or gallops, no peripheral edema Musculoskeletal: No deformities, no cyanosis or clubbing        Assessment & Plan:

## 2020-06-26 NOTE — Assessment & Plan Note (Signed)
Close cardiovascular benefits of CPAP including  the correlation between OSA and A. fib

## 2020-06-26 NOTE — Patient Instructions (Signed)
Trial of airfit F30 full face mask Try to use your machine at least 4-6 h every night

## 2020-06-26 NOTE — Assessment & Plan Note (Signed)
After his recent bout with atrial fibrillation, he is more committed to using his CPAP.  I will try to get a more comfortable by providing him an AirFit F30 full facemask.  Hopefully with a smaller implant over his face, he will be more comfortable and therefore more compliant  Weight loss encouraged, compliance with goal of at least 4-6 hrs every night is the expectation. Advised against medications with sedative side effects Cautioned against driving when sleepy - understanding that sleepiness will vary on a day to day basis

## 2020-06-27 ENCOUNTER — Encounter: Payer: Self-pay | Admitting: Physical Therapy

## 2020-06-27 ENCOUNTER — Ambulatory Visit: Payer: Medicare PPO | Admitting: Physical Therapy

## 2020-06-27 DIAGNOSIS — R2689 Other abnormalities of gait and mobility: Secondary | ICD-10-CM | POA: Diagnosis not present

## 2020-06-27 DIAGNOSIS — R262 Difficulty in walking, not elsewhere classified: Secondary | ICD-10-CM | POA: Diagnosis not present

## 2020-06-27 DIAGNOSIS — R2681 Unsteadiness on feet: Secondary | ICD-10-CM | POA: Diagnosis not present

## 2020-06-27 DIAGNOSIS — R42 Dizziness and giddiness: Secondary | ICD-10-CM | POA: Diagnosis not present

## 2020-06-27 NOTE — Therapy (Signed)
[facility] [address] [place], [place], [address] Phone: [phone]   Fax:  [phone]  Physical Therapy Treatment and D/C Summary  Patient Details  Name: Connor [name] MRN: [id number] Date of Birth: 25-Jul-1933 Referring Provider (PT): Star Age, MD   Encounter Date: 06/27/2020   PT End of Session - 06/27/20 1205    Visit Number 11    Number of Visits 13    Date for PT Re-Evaluation 06/28/20    Authorization Type [employer] -- paper Josem Kaufmann requested 05/17/20; 05-17-20 - 06-28-20    Authorization - Visit Number 11    Authorization - Number of Visits 12    PT Start Time 1102    PT Stop Time 1152    PT Time Calculation (min) 50 min    Activity Tolerance Patient tolerated treatment well    Behavior During Therapy [facility] for tasks assessed/performed           Past Medical History:  Diagnosis Date  . Benign localized prostatic hyperplasia with lower urinary tract symptoms (LUTS)    urologist-- dr Diona Fanti  . Chronic anticoagulation    Xarelto for afib  . Chronic constipation   . Chronic cystitis   . Gross hematuria   . History of DVT of lower extremity 06/08/2010   right femoral dvt dx post op total hip 05-08-2010  . History of gastric ulcer    remote  . History of kidney stones    one stone. remote hx  . Hyperlipidemia   . Hypertension    Dx by Dr. Lance Muss around age  74   . NICM (nonischemic cardiomyopathy) ([place])    per echo 06-11-2017,  ef 45-50% with diffuse hypokinesis,  G1DD  . OSA on CPAP 2017   compliant with CPAP.  managed by Dr Elsworth Soho.  . Osteoarthritis   . Other urethral stricture, male, meatal    chronic;  pt self dilates  . Paroxysmal atrial fibrillation [facility]) cardiologist-- dr Rayann Heman   first dx 2009/   a. documented by Dr Barrett Shell on office EKG 9/12. b. maintained on Tikosyn and Xarelto. c. s/p DCCV 's.  d.  x3  EP w/ ablation atrial fib last one 12-05-2016  . Prediabetes   .  Premature ventricular contraction   . PVC's (premature ventricular contractions)   . RA (rheumatoid arthritis) [facility])    rheumatologist--- Dr Page Connor,  treated with Humira  . RBBB (right bundle branch block)    Hx of a fib, ablation Aug 2018  . Rheumatoid arthritis ([place])   . Vitamin D deficiency   . Wears hearing aid in both ears     Past Surgical History:  Procedure Laterality Date  . ABLATION OF DYSRHYTHMIC FOCUS  08/29/2015  . ATRIAL FIBRILLATION ABLATION  12/05/2016  . ATRIAL FIBRILLATION ABLATION N/A 12/05/2016   Procedure: Atrial Fibrillation Ablation;  Surgeon: [name], MD;  Location: [facility];  Service: Cardiovascular;  Laterality: N/A;  . ATRIAL FIBRILLATION ABLATION N/A 02/29/2020   Procedure: ATRIAL FIBRILLATION ABLATION;  Surgeon: [name], MD;  Location: [facility];  Service: Cardiovascular;  Laterality: N/A;  . ATRIAL FLUTTER ABLATION  09/2010   by Greggory Brandy  . BUNIONECTOMY Right 1990s  . CARDIOVERSION N/A 10/28/2012   Procedure: CARDIOVERSION;  Surgeon: Burnell Blanks, MD;  Location: [facility];  Service: Cardiovascular;  Laterality: N/A;  . CATARACT EXTRACTION W/ INTRAOCULAR LENS  IMPLANT, BILATERAL  2019  . CYSTOSCOPY WITH BIOPSY N/A  09/03/2018   Procedure: CYSTOSCOPY WITH FULGURATION Fran Lowes;  Surgeon: Franchot Gallo, MD;  Location: [facility];  Service: Urology;  Laterality: N/A;  30 MINS  . ELECTROPHYSIOLOGIC STUDY N/A 08/29/2015   Procedure: Atrial Fibrillation Ablation;  Surgeon: [name], MD;  Location: [facility];  Service: Cardiovascular;  Laterality: N/A;  . KNEE SURGERY Bilateral x3   1960s & 1970s   open  . LEFT HEART CATHETERIZATION WITH CORONARY ANGIOGRAM N/A 06/07/2011   Procedure: LEFT HEART CATHETERIZATION WITH CORONARY ANGIOGRAM;  Surgeon: Peter M Martinique, MD;  Location: [facility] C[facility];  Service: Cardiovascular;  Laterality: N/A;    Normal coronary arteries,  low normal LVF  . TONSILLECTOMY AND ADENOIDECTOMY  age 73  .  TOTAL HIP ARTHROPLASTY Right 06/04/10    dr Noemi Chapel  '@[facility]'   . TOTAL KNEE ARTHROPLASTY Bilateral right 1995;  left 1997  . TRANSURETHRAL RESECTION OF PROSTATE  06-29-2002   dr humphries  '@[facility]'   . TRANSURETHRAL RESECTION OF PROSTATE N/A 07/06/2018   Procedure: TRANSURETHRAL RESECTION OF THE PROSTATE (TURP);  Surgeon: Franchot Gallo, MD;  Location: [facility];  Service: Urology;  Laterality: N/A;  45 MINS    There were no vitals filed for this visit.   Subjective Assessment - 06/27/20 1110    Subjective No falls or issues to report.  Veering and balance have improved overall.  Would like to have other exercises to continue to work on at D/C today.    Patient Stated Goals Improve morning dizziness    Currently in Pain? No/denies              [facility] PT Assessment - 06/27/20 1118      Functional Gait  Assessment   Gait assessed  Yes    Gait Level Surface Walks 20 ft in less than 7 sec but greater than 5.5 sec, uses assistive device, slower speed, mild gait deviations, or deviates 6-10 in outside of the 12 in walkway width.    Change in Gait Speed Able to smoothly change walking speed without loss of balance or gait deviation. Deviate no more than 6 in outside of the 12 in walkway width.    Gait with Horizontal Head Turns Performs head turns smoothly with no change in gait. Deviates no more than 6 in outside 12 in walkway width    Gait with Vertical Head Turns Performs head turns with no change in gait. Deviates no more than 6 in outside 12 in walkway width.    Gait and Pivot Turn Pivot turns safely within 3 sec and stops quickly with no loss of balance.    Step Over Obstacle Is able to step over one shoe box (4.5 in total height) without changing gait speed. No evidence of imbalance.    Gait with Narrow Base of Support Ambulates 4-7 steps.    Gait with Eyes Closed Walks 20 ft, slow speed, abnormal gait pattern, evidence for imbalance, deviates 10-15 in outside 12 in walkway  width. Requires more than 9 sec to ambulate 20 ft.    Ambulating Backwards Walks 20 ft, uses assistive device, slower speed, mild gait deviations, deviates 6-10 in outside 12 in walkway width.    Steps Alternating feet, must use rail.    Total Score 22    FGA comment: 30               Vestibular Assessment - 06/27/20 1118      Balancemaster   Balancemaster Comment MCTSIB: 30 seconds condition 1,  2, 3 + 13 seconds for condition 4.  Total of 103.                     Vestibular Treatment/Exercise - 06/27/20 1202      Vestibular Treatment/Exercise   Vestibular Treatment Provided Gaze      X1 Viewing Horizontal   Foot Position feet together on solid ground    Comments 60 seconds      X1 Viewing Vertical   Foot Position feet together on solid ground    Comments 60 seconds              Balance Exercises - 06/27/20 1202      Balance Exercises: Standing   Standing Eyes Closed Narrow base of support (BOS);Wide (BOA);Head turns;Foam/compliant surface;Other reps (comment)   10 reps head turns/nods   Tandem Gait Forward;Upper extremity support;2 reps   UE support on counter   Retro Gait Upper extremity support;2 reps;Limitations    Retro Gait Limitations with UE support on counter performed walking forwards and backwards with eyes closed             PT Education - 06/27/20 1204    Education Details progress towards goals, finalized balance and vestibular HEP.  Advised pt to perform strengthening exercises to tolerance due to increased knee pain.    Person(s) Educated Patient    Methods Explanation;Demonstration;Handout    Comprehension Verbalized understanding;Returned demonstration            PT Short Term Goals - 06/23/20 1511      PT SHORT TERM GOAL #1   Title independent with initial HEP    Baseline met 06-20-20    Time 3    Period Weeks    Status Achieved    Target Date 06/07/20      PT SHORT TERM GOAL #2   Title Pt will demo improved mCTSIB  score to at least 115    Baseline mCTSIB score 107    Time 3    Period Weeks    Status Partially Met    Target Date 06/07/20      PT SHORT TERM GOAL #3   Title Pt will report 50% improvement of his morning unsteadiness    Baseline balance and vertigo vary - pt had fall earlier this week when donning clothes after using bathroom  - 06-22-20    Time 3    Period Weeks    Status Partially Met    Target Date 06/07/20             PT Long Term Goals - 06/27/20 1111      PT LONG TERM GOAL #1   Title Pt will be independent with final HEP    Time 6    Period Weeks    Status Achieved      PT LONG TERM GOAL #2   Title Pt will have mCTSIB of 120; able to maintain balance in all conditions    Baseline 103; unable to hold condition 4 for 30 seconds    Time 6    Period Weeks    Status Partially Met      PT LONG TERM GOAL #3   Title Pt will have improved FGA score of at least 25/30 to demo decreased risk of falls    Baseline 21/30 > 22/30    Time 6    Period Weeks    Status Partially Met  Plan - 06/27/20 1206    Clinical Impression Statement Performed assessment of progress towards LTG.  Pt has made good progress and demonstrates independence with progressed vestibular and balance exercises on HEP; pt is limited in his ability to perform theraband strengthening exercises due to knee pain.  Pt has partially met FGA goal with improvement in balance when ambulating with head turns but continues to demonstrate balance impairments when vision removed and with narrow BOS; these two activities were added to HEP.  Pt has partially met MCTSIB goal; pt continues to demonstrate LOB when on compliant surface with vision removed; pt to continue to work on with HEP.  Pt is safe and ready for D/C today.    Personal Factors and Comorbidities Age;Comorbidity 1;Comorbidity 2;Comorbidity 3+    Comorbidities paroxysmal A. fib with status post ablation x 3, hearing loss, vitamin D  deficiency, rheumatoid arthritis, right bundle branch block, obstructive sleep apnea on CPAP, nonischemic cardiomyopathy, hypertension, hyperlipidemia, kidney stones, history of gastric ulcer, DVT, PVCs, borderline obesity, and history of bilat TKA and R THA.    Examination-Activity Limitations Stand;Sit;Locomotion Level;Transfers    Examination-Participation Restrictions Community Activity    Stability/Clinical Decision Making Evolving/Moderate complexity    Rehab Potential Good    PT Frequency 2x / week    PT Duration 6 weeks    PT Treatment/Interventions ADLs/Self Care Home Management;Canalith Repostioning;Electrical Stimulation;Aquatic Therapy;Moist Heat;Gait training;Stair training;Functional mobility training;Therapeutic activities;DME Instruction;Therapeutic exercise;Neuromuscular re-education;Balance training;Patient/family education;Manual techniques;Taping;Vestibular    PT Home Exercise Plan Access Code: [email or web address]; added hip strengthening with green theraband - Medbridge [email or web address]    Consulted and Agree with Plan of Care Patient           Patient will benefit from skilled therapeutic intervention in order to improve the following deficits and impairments:  Difficulty walking,Dizziness,Decreased activity tolerance,Decreased balance,Decreased mobility,Decreased strength  Visit Diagnosis: Unsteadiness on feet  Other abnormalities of gait and mobility  Dizziness and giddiness  Difficulty in walking, not elsewhere classified     Problem List Patient Active Problem List   Diagnosis Date Noted  . Secondary hypercoagulable state ([place]) 03/28/2020  . Malignant neoplasm of prostate ([place]) 09/11/2018  . Enlarged prostate with urinary obstruction 07/06/2018  . Gastrocnemius strain, right, initial encounter 12/10/2017  . Baker's cyst of knee, right 12/10/2017  . Obesity 03/24/2017  . Paroxysmal atrial fibrillation ([place]) 12/05/2016  . Near syncope 04/20/2016  . Leg hematoma, left,  initial encounter 04/20/2016  . Bleeding   . Ecchymosis   . Left hamstring muscle strain   . Muscle tear   . RBBB 08/30/2015  . Chronic systolic dysfunction of left ventricle 07/11/2014  . OSA (obstructive sleep apnea) 04/07/2013  . Multinodular goiter 01/20/2013  . Cervical spondylosis without myelopathy 01/18/2013  . Atrial fibrillation with RVR ([place]) 10/27/2012  . PAF (paroxysmal atrial fibrillation) ([place]) 05/28/2012  . Left ventricular dysfunction 12/17/2011  . Low back pain 09/11/2011  . Fatigue 05/22/2011  . PVC (premature ventricular contraction) 01/18/2011  . Persistent atrial fibrillation ([place]) 01/12/2011  . Hyperlipidemia 08/02/2010  . Hypertension 08/02/2010  . Osteoarthritis 08/02/2010  . BPH (benign prostatic hyperplasia) 08/02/2010  . GERD (gastroesophageal reflux disease) 08/02/2010  . h/o Atrial flutter ([place]) s/p ablation 2012      PHYSICAL THERAPY DISCHARGE SUMMARY  Visits from Start of Care: 11   Current functional level related to goals / functional outcomes: See LTG achievement and impression statement above.   Remaining deficits: Mild imbalance when vision removed and on compliant surfaces   Education /  Equipment: HEP  Plan: Patient agrees to discharge.  Patient goals were partially met. Patient is being discharged due to being pleased with the current functional level.  ?????     Rico Junker, PT, DPT 06/27/20    12:16 PM    [facility] [address] [place], [place], [address] Phone: [phone]   Fax:  [phone]  Name: Connor [name] MRN: [id number] Date of Birth: 03/23/1934

## 2020-06-27 NOTE — Patient Instructions (Addendum)
Gaze Stabilization: Standing Feet Together    Feet together, keeping eyes on target on wall __3__ feet away, tilt head down 15-30 and move head side to side for _60___ seconds. Repeat while moving head up and down for __60__ seconds. Do __2__ sessions per day.   Access Code: [email or web address] URL: [email or web address] Date: 06/27/2020 Prepared by: Misty Stanley  Exercises Walking with Eyes Closed and Counter Support - 1 x daily - 7 x weekly - 4 sets Walking Tandem Stance - 1 x daily - 7 x weekly - 4 sets Wide Stance with Eyes Closed and Head Rotation on Foam Pad - 1 x daily - 7 x weekly - 2 sets - 10 reps Wide Stance with Eyes Closed and Head Nods on Foam Pad - 1 x daily - 7 x weekly - 2 sets - 10 reps     added hip strengthening with green theraband - Medbridge [email or web address]

## 2020-06-29 ENCOUNTER — Ambulatory Visit: Payer: Medicare PPO | Admitting: Physical Therapy

## 2020-06-29 DIAGNOSIS — H40013 Open angle with borderline findings, low risk, bilateral: Secondary | ICD-10-CM | POA: Diagnosis not present

## 2020-07-01 DIAGNOSIS — H353132 Nonexudative age-related macular degeneration, bilateral, intermediate dry stage: Secondary | ICD-10-CM | POA: Diagnosis not present

## 2020-07-03 ENCOUNTER — Encounter: Payer: Self-pay | Admitting: Family Medicine

## 2020-07-03 DIAGNOSIS — M62838 Other muscle spasm: Secondary | ICD-10-CM

## 2020-07-05 DIAGNOSIS — G4733 Obstructive sleep apnea (adult) (pediatric): Secondary | ICD-10-CM | POA: Diagnosis not present

## 2020-07-07 ENCOUNTER — Other Ambulatory Visit: Payer: Self-pay | Admitting: Family Medicine

## 2020-07-10 DIAGNOSIS — C61 Malignant neoplasm of prostate: Secondary | ICD-10-CM | POA: Diagnosis not present

## 2020-07-12 ENCOUNTER — Ambulatory Visit: Payer: Self-pay | Admitting: Neurology

## 2020-07-13 ENCOUNTER — Ambulatory Visit: Payer: Medicare PPO | Attending: Family Medicine

## 2020-07-13 ENCOUNTER — Encounter: Payer: Self-pay | Admitting: Family Medicine

## 2020-07-13 ENCOUNTER — Other Ambulatory Visit: Payer: Self-pay

## 2020-07-13 DIAGNOSIS — E782 Mixed hyperlipidemia: Secondary | ICD-10-CM

## 2020-07-13 DIAGNOSIS — R262 Difficulty in walking, not elsewhere classified: Secondary | ICD-10-CM | POA: Diagnosis not present

## 2020-07-13 DIAGNOSIS — R2689 Other abnormalities of gait and mobility: Secondary | ICD-10-CM | POA: Diagnosis not present

## 2020-07-13 DIAGNOSIS — R252 Cramp and spasm: Secondary | ICD-10-CM | POA: Diagnosis not present

## 2020-07-13 DIAGNOSIS — R42 Dizziness and giddiness: Secondary | ICD-10-CM | POA: Diagnosis not present

## 2020-07-13 DIAGNOSIS — R2681 Unsteadiness on feet: Secondary | ICD-10-CM | POA: Insufficient documentation

## 2020-07-13 DIAGNOSIS — M542 Cervicalgia: Secondary | ICD-10-CM | POA: Diagnosis not present

## 2020-07-13 DIAGNOSIS — I10 Essential (primary) hypertension: Secondary | ICD-10-CM

## 2020-07-13 NOTE — Therapy (Signed)
[place]. [place], [place], [address] Phone: [phone]   Fax:  [phone]  Physical Therapy Evaluation  Patient Details  Name: Connor [name] MRN: [id number] Date of Birth: 1933/07/28 Referring Provider (PT): Lamar Blinks, MD   Encounter Date: 07/13/2020   PT End of Session - 07/13/20 1035    Visit Number 1    Number of Visits 12    Date for PT Re-Evaluation 09/07/20    Authorization Type [employer] - paper auth requested 07/13/20 - 09/07/20    Progress Note Due on Visit 10    PT Start Time 1018    PT Stop Time 1100    PT Time Calculation (min) 42 min           Past Medical History:  Diagnosis Date  . Benign localized prostatic hyperplasia with lower urinary tract symptoms (LUTS)    urologist-- dr Diona Fanti  . Chronic anticoagulation    Xarelto for afib  . Chronic constipation   . Chronic cystitis   . Gross hematuria   . History of DVT of lower extremity 06/08/2010   right femoral dvt dx post op total hip 05-08-2010  . History of gastric ulcer    remote  . History of kidney stones    one stone. remote hx  . Hyperlipidemia   . Hypertension    Dx by Dr. Lance Muss around age  36   . NICM (nonischemic cardiomyopathy) ([place])    per echo 06-11-2017,  ef 45-50% with diffuse hypokinesis,  G1DD  . OSA on CPAP 2017   compliant with CPAP.  managed by Dr Elsworth Soho.  . Osteoarthritis   . Other urethral stricture, male, meatal    chronic;  pt self dilates  . Paroxysmal atrial fibrillation [facility]) cardiologist-- dr Rayann Heman   first dx 2009/   a. documented by Dr Barrett Shell on office EKG 9/12. b. maintained on Tikosyn and Xarelto. c. s/p DCCV 's.  d.  x3  EP w/ ablation atrial fib last one 12-05-2016  . Prediabetes   . Premature ventricular contraction   . PVC's (premature ventricular contractions)   . RA (rheumatoid arthritis) [facility])    rheumatologist--- Dr Page Spiro,  treated with Humira  . RBBB (right  bundle branch block)    Hx of a fib, ablation Aug 2018  . Rheumatoid arthritis ([place])   . Vitamin D deficiency   . Wears hearing aid in both ears     Past Surgical History:  Procedure Laterality Date  . ABLATION OF DYSRHYTHMIC FOCUS  08/29/2015  . ATRIAL FIBRILLATION ABLATION  12/05/2016  . ATRIAL FIBRILLATION ABLATION N/A 12/05/2016   Procedure: Atrial Fibrillation Ablation;  Surgeon: [name], MD;  Location: [place] CV LAB;  Service: Cardiovascular;  Laterality: N/A;  . ATRIAL FIBRILLATION ABLATION N/A 02/29/2020   Procedure: ATRIAL FIBRILLATION ABLATION;  Surgeon: [name], MD;  Location: [place] CV LAB;  Service: Cardiovascular;  Laterality: N/A;  . ATRIAL FLUTTER ABLATION  09/2010   by Greggory Brandy  . BUNIONECTOMY Right 1990s  . CARDIOVERSION N/A 10/28/2012   Procedure: CARDIOVERSION;  Surgeon: Burnell Blanks, MD;  Location: [place];  Service: Cardiovascular;  Laterality: N/A;  . CATARACT EXTRACTION W/ INTRAOCULAR LENS  IMPLANT, BILATERAL  2019  . CYSTOSCOPY WITH BIOPSY N/A 09/03/2018   Procedure: CYSTOSCOPY WITH FULGURATION DTOIZTIWP;  Surgeon: Franchot Gallo, MD;  Location: [facility];  Service: Urology;  Laterality: N/A;  30 MINS  . ELECTROPHYSIOLOGIC STUDY  N/A 08/29/2015   Procedure: Atrial Fibrillation Ablation;  Surgeon: [name], MD;  Location: [place] CV LAB;  Service: Cardiovascular;  Laterality: N/A;  . KNEE SURGERY Bilateral x3   1960s & 1970s   open  . LEFT HEART CATHETERIZATION WITH CORONARY ANGIOGRAM N/A 06/07/2011   Procedure: LEFT HEART CATHETERIZATION WITH CORONARY ANGIOGRAM;  Surgeon: Peter M Martinique, MD;  Location: [facility] CATH LAB;  Service: Cardiovascular;  Laterality: N/A;    Normal coronary arteries,  low normal LVF  . TONSILLECTOMY AND ADENOIDECTOMY  age 83  . TOTAL HIP ARTHROPLASTY Right 06/04/10    dr Noemi Chapel  @[facility]   . TOTAL KNEE ARTHROPLASTY Bilateral right 1995;  left 1997  . TRANSURETHRAL RESECTION OF PROSTATE  06-29-2002   dr humphries  @[facility]   .  TRANSURETHRAL RESECTION OF PROSTATE N/A 07/06/2018   Procedure: TRANSURETHRAL RESECTION OF THE PROSTATE (TURP);  Surgeon: Franchot Gallo, MD;  [facility];  Service: Urology;  Laterality: N/A;  45 MINS    There were no vitals filed for this visit.    Subjective Assessment - 07/13/20 1026    Subjective Pt reports he started having a neck issue at the end of his therapy. He has a spot on the right side of his neck that hurts and gives him teh feeling of lightheadedness, but if he presses on it, it goes away. He just finished up some rehab for vertigo and balance over at neuro rehab.    Pertinent History paroxysmal A. fib with status post ablation x 3, hearing loss, vitamin D deficiency, rheumatoid arthritis, right bundle branch block, obstructive sleep apnea on CPAP, nonischemic cardiomyopathy, hypertension, hyperlipidemia, kidney stones, history of gastric ulcer, DVT, PVCs and borderline obesity    Diagnostic tests MRI brain 05/12/20: insignificant    Patient Stated Goals get neck straightened out and relaxed, get rid of knots in muscle, get rid of feeling lightheaded    Currently in Pain? Yes    Pain Score 1    3-4/10 at worst   Pain Location Neck    Pain Orientation Right    Pain Descriptors / Indicators Stabbing;Dull;Aching    Pain Type Chronic pain;Acute pain   acute on chronic   Pain Onset 1 to 4 weeks ago    Pain Frequency Intermittent    Aggravating Factors  affects balance with walking due to lightheadedness    Effect of Pain on Daily Activities can affect balance              [facility] PT Assessment - 07/13/20 0001      Assessment   Medical Diagnosis Muscles Spasms of Neck    Referring Provider (PT) Lamar Blinks, MD    Onset Date/Surgical Date 06/15/20    Hand Dominance Right    Next MD Visit unknown    Prior Therapy just finished neuro rehab for vertigo, for neck in OP in the past      Precautions   Precautions None      Restrictions   Weight  Bearing Restrictions No      Balance Screen   Has the patient fallen in the past 6 months No    Has the patient had a decrease in activity level because of a fear of falling?  No      Home Environment   Living Arrangements Spouse/significant other      Prior Function   Level of Independence Independent    Vocation Retired    Leisure walks, time with family  Observation/Other Assessments   Focus on Therapeutic Outcomes (FOTO)  56% ability, 64% predicted      Sensation   Additional Comments denies NT      Posture/Postural Control   Posture Comments decr cervical lordosis, forward head, sacral sitting, forward shoulder rounding      ROM / Strength   AROM / PROM / Strength AROM      AROM   AROM Assessment Site Cervical    Cervical Flexion 45    Cervical Extension 32    Cervical - Right Side Bend 25    Cervical - Left Side Bend 22    Cervical - Right Rotation 58    Cervical - Left Rotation 58      Palpation   Spinal mobility decreased mobility with R mid-upper C/S side glides, L C/S worse    Palpation comment TTP to R C/S PS C3-5, TrP C3 R                      Objective measurements completed on examination: See above findings.               PT Education - 07/13/20 1251    Education Details Diagnosis, prognosis, FOTO, HEP, POC    Person(s) Educated Patient    Methods Explanation;Demonstration;Tactile cues;Verbal cues;Handout    Comprehension Verbalized understanding;Returned demonstration;Verbal cues required;Tactile cues required            PT Short Term Goals - 07/13/20 1304      PT SHORT TERM GOAL #1   Title Pt will be I and compliant with initial HEP.    Baseline provided at eval 3/10    Time 3    Period Weeks    Status New    Target Date 08/03/20             PT Long Term Goals - 07/13/20 1304      PT LONG TERM GOAL #1   Title Pt will be independent with long term HEP for improved posture.    Time 8    Period Weeks     Status New    Target Date 09/07/20      PT LONG TERM GOAL #2   Title Pt will walk for 30 minutes with no report of lightheadedness, as needed for daily exercise and errands.    Time 8    Period Weeks    Status New    Target Date 08/03/20      PT LONG TERM GOAL #3   Title Pt will demo pain free cervical AROM in all planes, nearly symmetrical from side to side .    Baseline discomfort in R side of neck, discrepancy in lateral flexion    Time 8    Period Weeks    Status New    Target Date 09/07/20      PT LONG TERM GOAL #4   Title Pt will demo FOTO score of at least 64%, demonstrating significant change in subjective ability.    Baseline 56% ability    Time 8    Period Weeks    Status New    Target Date 09/07/20                  Plan - 07/13/20 1257    Clinical Impression Statement Pt is an 87 yo male who presents to OP PT with R-sided neck pain causing feeling of lightheadedness mainly with increased walking distance. Pt can pinpoint area and  abolish lightheadedness with pinpoint pressure to knot in muscle. Pt does ambulate downward gaze and forward flexed posture with increased pressue to cervical spine. He demonstrates loss of cervical lordosis, forward head, increased upper thoracic kyphosis, and forward rounding on shoulders. He reported feeling better with manual traction in sitting and with improved postural alignment. Pt was educated on importance of good posture, cervical positioning, diagnosis, prognosis, FOTO, POC, and HEP. He verbalized understanding and consent to tx. He would benefit from skilled PT 1-2x/week for 6-8 weeks to restore pain free mobility and PLOF.    Personal Factors and Comorbidities Comorbidity 3+;Age    Comorbidities paroxysmal A. fib with status post ablation x 3, hearing loss, vitamin D deficiency, rheumatoid arthritis, right bundle branch block, obstructive sleep apnea on CPAP, nonischemic cardiomyopathy, hypertension, hyperlipidemia, kidney  stones, history of gastric ulcer, DVT, PVCs, borderline obesity, and history of bilat TKA and R THA.    Examination-Activity Limitations Locomotion Level    Examination-Participation Restrictions Community Activity    Stability/Clinical Decision Making Evolving/Moderate complexity    Clinical Decision Making Moderate    Rehab Potential Good    PT Frequency --   1-2x/week   PT Duration --   6-8 weeks   PT Treatment/Interventions ADLs/Self Care Home Management;Electrical Stimulation;Aquatic Therapy;Moist Heat;Functional mobility training;Therapeutic activities;DME Instruction;Therapeutic exercise;Neuromuscular re-education;Balance training;Patient/family education;Manual techniques;Taping;Vestibular;Iontophoresis 4mg /ml Dexamethasone;Joint Manipulations;Spinal Manipulations;Passive range of motion;Dry needling;Traction    PT Next Visit Plan Assess response to HEP/update PRN, progress cervical mobility, postural alignment, DNF strengthening    PT Home Exercise Plan [email or web address]    Consulted and Agree with Plan of Care Patient           Patient will benefit from skilled therapeutic intervention in order to improve the following deficits and impairments:  Pain,Postural dysfunction,Increased fascial restricitons,Decreased activity tolerance,Decreased balance,Decreased mobility,Hypomobility,Improper body mechanics,Impaired perceived functional ability,Decreased range of motion  Visit Diagnosis: Cervicalgia - Plan: PT plan of care cert/re-cert  Cramp and spasm - Plan: PT plan of care cert/re-cert  Other abnormalities of gait and mobility - Plan: PT plan of care cert/re-cert     Problem List Patient Active Problem List   Diagnosis Date Noted  . Secondary hypercoagulable state ([place]) 03/28/2020  . Malignant neoplasm of prostate ([place]) 09/11/2018  . Enlarged prostate with urinary obstruction 07/06/2018  . Gastrocnemius strain, right, initial encounter 12/10/2017  . Baker's cyst of knee, right  12/10/2017  . Obesity 03/24/2017  . Paroxysmal atrial fibrillation ([place]) 12/05/2016  . Near syncope 04/20/2016  . Leg hematoma, left, initial encounter 04/20/2016  . Bleeding   . Ecchymosis   . Left hamstring muscle strain   . Muscle tear   . RBBB 08/30/2015  . Chronic systolic dysfunction of left ventricle 07/11/2014  . OSA (obstructive sleep apnea) 04/07/2013  . Multinodular goiter 01/20/2013  . Cervical spondylosis without myelopathy 01/18/2013  . Atrial fibrillation with RVR ([place]) 10/27/2012  . PAF (paroxysmal atrial fibrillation) ([place]) 05/28/2012  . Left ventricular dysfunction 12/17/2011  . Low back pain 09/11/2011  . Fatigue 05/22/2011  . PVC (premature ventricular contraction) 01/18/2011  . Persistent atrial fibrillation ([place]) 01/12/2011  . Hyperlipidemia 08/02/2010  . Hypertension 08/02/2010  . Osteoarthritis 08/02/2010  . BPH (benign prostatic hyperplasia) 08/02/2010  . GERD (gastroesophageal reflux disease) 08/02/2010  . h/o Atrial flutter [facility]) s/p ablation 2012     Izell Springbrook, PT, DPT 07/13/2020, 1:11 PM  [place]. [place], [place], [address] Phone: [phone]   Fax:  [phone]  Name: Connor [name] MRN: [id number] Date of Birth: 1934/05/03

## 2020-07-13 NOTE — Patient Instructions (Signed)
Access Code: [email or web address] URL: [email or web address] Date: 07/13/2020 Prepared by: Kathreen Cornfield  Exercises Seated Cervical Retraction - 3 x daily - 7 x weekly - 1 sets - 10 reps - 5 seconds hold Supine Chin Tuck - 1-2 x daily - 7 x weekly - 1 sets - 10 reps - 5 seconds hold Seated Thoracic Extension with Pectoralis Stretch - 2 x daily - 7 x weekly - 1-2 sets - 10 reps Open Books - 2 x daily - 7 x weekly - 1 sets - 15-20 reps Seated Cervical Rotation AROM - 1 x daily - 7 x weekly - 1 sets - 10 reps

## 2020-07-14 DIAGNOSIS — N400 Enlarged prostate without lower urinary tract symptoms: Secondary | ICD-10-CM | POA: Diagnosis not present

## 2020-07-14 DIAGNOSIS — Z8546 Personal history of malignant neoplasm of prostate: Secondary | ICD-10-CM | POA: Diagnosis not present

## 2020-07-14 MED ORDER — LISINOPRIL 40 MG PO TABS: 40.0000 mg | ORAL_TABLET | Freq: Every day | ORAL | 3 refills | Status: DC

## 2020-07-14 NOTE — Addendum Note (Signed)
Addended by: Lamar Blinks C on: 07/14/2020 04:15 PM   Modules accepted: Orders

## 2020-07-19 ENCOUNTER — Ambulatory Visit: Payer: Medicare PPO | Admitting: Physical Therapy

## 2020-07-19 ENCOUNTER — Other Ambulatory Visit: Payer: Self-pay

## 2020-07-19 ENCOUNTER — Encounter: Payer: Self-pay | Admitting: Physical Therapy

## 2020-07-19 DIAGNOSIS — R2681 Unsteadiness on feet: Secondary | ICD-10-CM | POA: Diagnosis not present

## 2020-07-19 DIAGNOSIS — R252 Cramp and spasm: Secondary | ICD-10-CM

## 2020-07-19 DIAGNOSIS — R42 Dizziness and giddiness: Secondary | ICD-10-CM | POA: Diagnosis not present

## 2020-07-19 DIAGNOSIS — R2689 Other abnormalities of gait and mobility: Secondary | ICD-10-CM | POA: Diagnosis not present

## 2020-07-19 DIAGNOSIS — R262 Difficulty in walking, not elsewhere classified: Secondary | ICD-10-CM | POA: Diagnosis not present

## 2020-07-19 DIAGNOSIS — M542 Cervicalgia: Secondary | ICD-10-CM | POA: Diagnosis not present

## 2020-07-19 NOTE — Therapy (Signed)
[place]. [place], [place], [address] Phone: [phone]   Fax:  [phone]  Physical Therapy Treatment  Patient Details  Name: Connor [name] MRN: [id number] Date of Birth: 05-22-1933 Referring Provider (PT): Lamar Blinks, MD   Encounter Date: 07/19/2020   PT End of Session - 07/19/20 0907    Visit Number 2    Number of Visits 12    Date for PT Re-Evaluation 09/07/20    Authorization Type [employer] - paper auth requested 07/13/20 - 09/07/20    PT Start Time 0837    PT Stop Time 0925    PT Time Calculation (min) 48 min    Activity Tolerance Patient tolerated treatment well    Behavior During Therapy [facility] for tasks assessed/performed           Past Medical History:  Diagnosis Date  . Benign localized prostatic hyperplasia with lower urinary tract symptoms (LUTS)    urologist-- dr Diona Fanti  . Chronic anticoagulation    Xarelto for afib  . Chronic constipation   . Chronic cystitis   . Gross hematuria   . History of DVT of lower extremity 06/08/2010   right femoral dvt dx post op total hip 05-08-2010  . History of gastric ulcer    remote  . History of kidney stones    one stone. remote hx  . Hyperlipidemia   . Hypertension    Dx by Dr. Lance Muss around age  38   . NICM (nonischemic cardiomyopathy) ([place])    per echo 06-11-2017,  ef 45-50% with diffuse hypokinesis,  G1DD  . OSA on CPAP 2017   compliant with CPAP.  managed by Dr Elsworth Soho.  . Osteoarthritis   . Other urethral stricture, male, meatal    chronic;  pt self dilates  . Paroxysmal atrial fibrillation [facility]) cardiologist-- dr Rayann Heman   first dx 2009/   a. documented by Dr Barrett Shell on office EKG 9/12. b. maintained on Tikosyn and Xarelto. c. s/p DCCV 's.  d.  x3  EP w/ ablation atrial fib last one 12-05-2016  . Prediabetes   . Premature ventricular contraction   . PVC's (premature ventricular contractions)   . RA (rheumatoid arthritis)  [facility])    rheumatologist--- Dr Page Spiro,  treated with Humira  . RBBB (right bundle branch block)    Hx of a fib, ablation Aug 2018  . Rheumatoid arthritis ([place])   . Vitamin D deficiency   . Wears hearing aid in both ears     Past Surgical History:  Procedure Laterality Date  . ABLATION OF DYSRHYTHMIC FOCUS  08/29/2015  . ATRIAL FIBRILLATION ABLATION  12/05/2016  . ATRIAL FIBRILLATION ABLATION N/A 12/05/2016   Procedure: Atrial Fibrillation Ablation;  Surgeon: [name], MD;  Location: [place] CV LAB;  Service: Cardiovascular;  Laterality: N/A;  . ATRIAL FIBRILLATION ABLATION N/A 02/29/2020   Procedure: ATRIAL FIBRILLATION ABLATION;  Surgeon: [name], MD;  Location: [place] CV LAB;  Service: Cardiovascular;  Laterality: N/A;  . ATRIAL FLUTTER ABLATION  09/2010   by Greggory Brandy  . BUNIONECTOMY Right 1990s  . CARDIOVERSION N/A 10/28/2012   Procedure: CARDIOVERSION;  Surgeon: Burnell Blanks, MD;  Location: [place];  Service: Cardiovascular;  Laterality: N/A;  . CATARACT EXTRACTION W/ INTRAOCULAR LENS  IMPLANT, BILATERAL  2019  . CYSTOSCOPY WITH BIOPSY N/A 09/03/2018   Procedure: CYSTOSCOPY WITH FULGURATION LOVFIEPPI;  Surgeon: Franchot Gallo, MD;  Location: [facility];  Service: Urology;  Laterality: N/A;  30 MINS  . ELECTROPHYSIOLOGIC STUDY N/A 08/29/2015   Procedure: Atrial Fibrillation Ablation;  Surgeon: [name], MD;  Location: [place] CV LAB;  Service: Cardiovascular;  Laterality: N/A;  . KNEE SURGERY Bilateral x3   1960s & 1970s   open  . LEFT HEART CATHETERIZATION WITH CORONARY ANGIOGRAM N/A 06/07/2011   Procedure: LEFT HEART CATHETERIZATION WITH CORONARY ANGIOGRAM;  Surgeon: Peter M Martinique, MD;  Location: [facility] CATH LAB;  Service: Cardiovascular;  Laterality: N/A;    Normal coronary arteries,  low normal LVF  . TONSILLECTOMY AND ADENOIDECTOMY  age 69  . TOTAL HIP ARTHROPLASTY Right 06/04/10    dr Noemi Chapel  $Remov'@[email or web address]$   . TOTAL KNEE ARTHROPLASTY Bilateral right 1995;  left  1997  . TRANSURETHRAL RESECTION OF PROSTATE  06-29-2002   dr humphries  $[email or web address]'@[email or web address]$   . TRANSURETHRAL RESECTION OF PROSTATE N/A 07/06/2018   Procedure: TRANSURETHRAL RESECTION OF THE PROSTATE (TURP);  Surgeon: Franchot Gallo, MD;  Location: [facility];  Service: Urology;  Laterality: N/A;  45 MINS    There were no vitals filed for this visit.   Subjective Assessment - 07/19/20 0837    Subjective Patient reports that he has done the HEP once since the last visit.  Reports that they do not hurt.    Currently in Pain? Yes    Pain Score 3     Pain Location Neck    Pain Orientation Right    Pain Descriptors / Indicators Spasm;Tightness    Pain Relieving Factors pressure on the right side of the neck helps                             [facility] Adult PT Treatment/Exercise - 07/19/20 0001      Modalities   Modalities Traction      Traction   Type of Traction Cervical    Max (lbs) 15    Hold Time static    Time 12      Manual Therapy   Manual Therapy Soft tissue mobilization    Soft tissue mobilization right cervical area and into the right upper trap                  PT Education - 07/19/20 0906    Education Details Went over the HEP and performed, he had a few questions and we worked  al ot on [employer] with these trying to maintain posture and increase his cervical and thoracic motion    Person(s) Educated Patient    Methods Explanation;Demonstration;Tactile cues;Verbal cues    Comprehension Verbalized understanding;Verbal cues required;Tactile cues required;Returned demonstration            PT Short Term Goals - 07/19/20 0912      PT SHORT TERM GOAL #1   Title Pt will be I and compliant with initial HEP.    Status Partially Met             PT Long Term Goals - 07/13/20 1304      PT LONG TERM GOAL #1   Title Pt will be independent with long term HEP for improved posture.    Time 8    Period Weeks    Status New    Target Date  09/07/20      PT LONG TERM GOAL #2   Title Pt will walk for 30 minutes with no report of lightheadedness, as needed for daily exercise and errands.  Time 8    Period Weeks    Status New    Target Date 08/03/20      PT LONG TERM GOAL #3   Title Pt will demo pain free cervical AROM in all planes, nearly symmetrical from side to side .    Baseline discomfort in R side of neck, discrepancy in lateral flexion    Time 8    Period Weeks    Status New    Target Date 09/07/20      PT LONG TERM GOAL #4   Title Pt will demo FOTO score of at least 64%, demonstrating significant change in subjective ability.    Baseline 56% ability    Time 8    Period Weeks    Status New    Target Date 09/07/20                 Plan - 07/19/20 0910    Clinical Impression Statement Patient had questions and dificulty with HEP, needed some cues to do, posture and ROM are the issues, I worked on the right cervical area today trying to relieve the spasm, he reports that he presses hard on this area to help, I also added traction, seemed to respond well, no increase of pain and reported feeling looser after    PT Next Visit Plan start gym exercises to help with posture and scapular stability, see how the traction did           Patient will benefit from skilled therapeutic intervention in order to improve the following deficits and impairments:  Pain,Postural dysfunction,Increased fascial restricitons,Decreased activity tolerance,Decreased balance,Decreased mobility,Hypomobility,Improper body mechanics,Impaired perceived functional ability,Decreased range of motion  Visit Diagnosis: Cervicalgia  Cramp and spasm     Problem List Patient Active Problem List   Diagnosis Date Noted  . Secondary hypercoagulable state ([place]) 03/28/2020  . Malignant neoplasm of prostate ([place]) 09/11/2018  . Enlarged prostate with urinary obstruction 07/06/2018  . Gastrocnemius strain, right, initial encounter  12/10/2017  . Baker's cyst of knee, right 12/10/2017  . Obesity 03/24/2017  . Paroxysmal atrial fibrillation ([place]) 12/05/2016  . Near syncope 04/20/2016  . Leg hematoma, left, initial encounter 04/20/2016  . Bleeding   . Ecchymosis   . Left hamstring muscle strain   . Muscle tear   . RBBB 08/30/2015  . Chronic systolic dysfunction of left ventricle 07/11/2014  . OSA (obstructive sleep apnea) 04/07/2013  . Multinodular goiter 01/20/2013  . Cervical spondylosis without myelopathy 01/18/2013  . Atrial fibrillation with RVR ([place]) 10/27/2012  . PAF (paroxysmal atrial fibrillation) ([place]) 05/28/2012  . Left ventricular dysfunction 12/17/2011  . Low back pain 09/11/2011  . Fatigue 05/22/2011  . PVC (premature ventricular contraction) 01/18/2011  . Persistent atrial fibrillation ([place]) 01/12/2011  . Hyperlipidemia 08/02/2010  . Hypertension 08/02/2010  . Osteoarthritis 08/02/2010  . BPH (benign prostatic hyperplasia) 08/02/2010  . GERD (gastroesophageal reflux disease) 08/02/2010  . h/o Atrial flutter [facility]) s/p ablation 2012     Shreyansh Tiffany W., PT 07/19/2020, 9:13 AM  [place]. [place], [place], [address] Phone: [phone]   Fax:  [phone]  Name: SHAHZAIB AZEVEDO MRN: [id number] Date of Birth: 1933-07-11

## 2020-07-25 ENCOUNTER — Other Ambulatory Visit: Payer: Self-pay

## 2020-07-25 ENCOUNTER — Ambulatory Visit: Payer: Medicare PPO | Admitting: Physical Therapy

## 2020-07-25 ENCOUNTER — Encounter: Payer: Self-pay | Admitting: Physical Therapy

## 2020-07-25 DIAGNOSIS — R252 Cramp and spasm: Secondary | ICD-10-CM

## 2020-07-25 DIAGNOSIS — M542 Cervicalgia: Secondary | ICD-10-CM | POA: Diagnosis not present

## 2020-07-25 DIAGNOSIS — R262 Difficulty in walking, not elsewhere classified: Secondary | ICD-10-CM

## 2020-07-25 DIAGNOSIS — R2681 Unsteadiness on feet: Secondary | ICD-10-CM | POA: Diagnosis not present

## 2020-07-25 DIAGNOSIS — R2689 Other abnormalities of gait and mobility: Secondary | ICD-10-CM | POA: Diagnosis not present

## 2020-07-25 DIAGNOSIS — R42 Dizziness and giddiness: Secondary | ICD-10-CM | POA: Diagnosis not present

## 2020-07-25 NOTE — Therapy (Signed)
[place] [place], [place], [address] Phone: [phone]   Fax:  [phone]  Physical Therapy Treatment  Patient Details  Name: Connor [name] MRN: [id number] Date of Birth: March 11, 1934 Referring Provider (PT): Lamar Blinks, MD   Encounter Date: 07/25/2020   PT End of Session - 07/25/20 1042    Visit Number 3    Number of Visits 12    Date for PT Re-Evaluation 09/07/20    Authorization Type [employer] - paper auth requested 07/13/20 - 09/07/20    PT Start Time 1005    PT Stop Time 1100    PT Time Calculation (min) 55 min    Activity Tolerance Patient tolerated treatment well    Behavior During Therapy [facility] for tasks assessed/performed           Past Medical History:  Diagnosis Date  . Benign localized prostatic hyperplasia with lower urinary tract symptoms (LUTS)    urologist-- dr Diona Fanti  . Chronic anticoagulation    Xarelto for afib  . Chronic constipation   . Chronic cystitis   . Gross hematuria   . History of DVT of lower extremity 06/08/2010   right femoral dvt dx post op total hip 05-08-2010  . History of gastric ulcer    remote  . History of kidney stones    one stone. remote hx  . Hyperlipidemia   . Hypertension    Dx by Dr. Lance Muss around age  71   . NICM (nonischemic cardiomyopathy) ([place])    per echo 06-11-2017,  ef 45-50% with diffuse hypokinesis,  G1DD  . OSA on CPAP 2017   compliant with CPAP.  managed by Dr Elsworth Soho.  . Osteoarthritis   . Other urethral stricture, male, meatal    chronic;  pt self dilates  . Paroxysmal atrial fibrillation [facility]) cardiologist-- dr Rayann Heman   first dx 2009/   a. documented by Dr Barrett Shell on office EKG 9/12. b. maintained on Tikosyn and Xarelto. c. s/p DCCV 's.  d.  x3  EP w/ ablation atrial fib last one 12-05-2016  . Prediabetes   . Premature ventricular contraction   . PVC's (premature ventricular contractions)   . RA (rheumatoid arthritis)  [facility])    rheumatologist--- Dr Page Spiro,  treated with Humira  . RBBB (right bundle branch block)    Hx of a fib, ablation Aug 2018  . Rheumatoid arthritis ([place])   . Vitamin D deficiency   . Wears hearing aid in both ears     Past Surgical History:  Procedure Laterality Date  . ABLATION OF DYSRHYTHMIC FOCUS  08/29/2015  . ATRIAL FIBRILLATION ABLATION  12/05/2016  . ATRIAL FIBRILLATION ABLATION N/A 12/05/2016   Procedure: Atrial Fibrillation Ablation;  Surgeon: [name], MD;  Location: [place] CV LAB;  Service: Cardiovascular;  Laterality: N/A;  . ATRIAL FIBRILLATION ABLATION N/A 02/29/2020   Procedure: ATRIAL FIBRILLATION ABLATION;  Surgeon: [name], MD;  Location: [place] CV LAB;  Service: Cardiovascular;  Laterality: N/A;  . ATRIAL FLUTTER ABLATION  09/2010   by Greggory Brandy  . BUNIONECTOMY Right 1990s  . CARDIOVERSION N/A 10/28/2012   Procedure: CARDIOVERSION;  Surgeon: Burnell Blanks, MD;  Location: [place];  Service: Cardiovascular;  Laterality: N/A;  . CATARACT EXTRACTION W/ INTRAOCULAR LENS  IMPLANT, BILATERAL  2019  . CYSTOSCOPY WITH BIOPSY N/A 09/03/2018   Procedure: CYSTOSCOPY WITH FULGURATION [id number];  Surgeon: Franchot Gallo, MD;  Location: [facility];  Service: Urology;  Laterality: N/A;  30 MINS  . ELECTROPHYSIOLOGIC STUDY N/A 08/29/2015   Procedure: Atrial Fibrillation Ablation;  Surgeon: [name], MD;  Location: [place] CV LAB;  Service: Cardiovascular;  Laterality: N/A;  . KNEE SURGERY Bilateral x3   1960s & 1970s   open  . LEFT HEART CATHETERIZATION WITH CORONARY ANGIOGRAM N/A 06/07/2011   Procedure: LEFT HEART CATHETERIZATION WITH CORONARY ANGIOGRAM;  Surgeon: Peter M Martinique, MD;  Location: [facility] CATH LAB;  Service: Cardiovascular;  Laterality: N/A;    Normal coronary arteries,  low normal LVF  . TONSILLECTOMY AND ADENOIDECTOMY  age 89  . TOTAL HIP ARTHROPLASTY Right 06/04/10    dr Noemi Chapel  @[facility]   . TOTAL KNEE ARTHROPLASTY Bilateral right 1995;  left  1997  . TRANSURETHRAL RESECTION OF PROSTATE  06-29-2002   dr humphries  @[facility]   . TRANSURETHRAL RESECTION OF PROSTATE N/A 07/06/2018   Procedure: TRANSURETHRAL RESECTION OF THE PROSTATE (TURP);  Surgeon: Franchot Gallo, MD;  Location: [facility];  Service: Urology;  Laterality: N/A;  45 MINS    There were no vitals filed for this visit.   Subjective Assessment - 07/25/20 1010    Subjective I think the pain and the dizziness are a little better after the last treatment    Currently in Pain? Yes    Pain Score 2     Pain Location Neck    Pain Orientation Right    Pain Descriptors / Indicators Tightness    Aggravating Factors  stress    Pain Relieving Factors the last treatment helped                             [facility] Adult PT Treatment/Exercise - 07/25/20 0001      Exercises   Exercises Neck      Neck Exercises: Machines for Strengthening   UBE (Upper Arm Bike) level 2 x 6 minutes    Cybex Row 20# 2x10    Lat Pull 20# 2x10      Neck Exercises: Theraband   Shoulder Extension 20 reps;Red    Rows 20 reps;Red    Shoulder External Rotation 20 reps;Red      Neck Exercises: Standing   Neck Retraction 10 reps;3 secs    Other Standing Exercises 5# shrugs with upper trap and levator stretches    Other Standing Exercises back to wall weighted ball overhead lift, W backs      Modalities   Modalities Traction      Traction   Type of Traction Cervical    Max (lbs) 15    Hold Time static    Time 12      Manual Therapy   Manual Therapy Soft tissue mobilization    Soft tissue mobilization right cervical area and into the right upper trap                    PT Short Term Goals - 07/25/20 1045      PT SHORT TERM GOAL #1   Title Pt will be I and compliant with initial HEP.    Status Achieved             PT Long Term Goals - 07/25/20 1045      PT LONG TERM GOAL #1   Title Pt will be independent with long term HEP for improved  posture.    Status On-going      PT LONG TERM GOAL #2  Title Pt will walk for 30 minutes with no report of lightheadedness, as needed for daily exercise and errands.    Status On-going                 Plan - 07/25/20 1043    Clinical Impression Statement Patient is feeling a little less pain and a little less dizziness.  He requires verbal, tactile and visual cues for all exercises for posture and form, he is very hard of hearing and I think this plus the masks make it difficult for him to follow instructions.    PT Next Visit Plan see ho gym exercises did, he struggled with the W backs    Consulted and Agree with Plan of Care Patient           Patient will benefit from skilled therapeutic intervention in order to improve the following deficits and impairments:  Pain,Postural dysfunction,Increased fascial restricitons,Decreased activity tolerance,Decreased balance,Decreased mobility,Hypomobility,Improper body mechanics,Impaired perceived functional ability,Decreased range of motion  Visit Diagnosis: Cervicalgia  Cramp and spasm  Other abnormalities of gait and mobility  Unsteadiness on feet  Dizziness and giddiness  Difficulty in walking, not elsewhere classified     Problem List Patient Active Problem List   Diagnosis Date Noted  . Secondary hypercoagulable state ([place]) 03/28/2020  . Malignant neoplasm of prostate ([place]) 09/11/2018  . Enlarged prostate with urinary obstruction 07/06/2018  . Gastrocnemius strain, right, initial encounter 12/10/2017  . Baker's cyst of knee, right 12/10/2017  . Obesity 03/24/2017  . Paroxysmal atrial fibrillation ([place]) 12/05/2016  . Near syncope 04/20/2016  . Leg hematoma, left, initial encounter 04/20/2016  . Bleeding   . Ecchymosis   . Left hamstring muscle strain   . Muscle tear   . RBBB 08/30/2015  . Chronic systolic dysfunction of left ventricle 07/11/2014  . OSA (obstructive sleep apnea) 04/07/2013  . Multinodular  goiter 01/20/2013  . Cervical spondylosis without myelopathy 01/18/2013  . Atrial fibrillation with RVR ([place]) 10/27/2012  . PAF (paroxysmal atrial fibrillation) ([place]) 05/28/2012  . Left ventricular dysfunction 12/17/2011  . Low back pain 09/11/2011  . Fatigue 05/22/2011  . PVC (premature ventricular contraction) 01/18/2011  . Persistent atrial fibrillation ([place]) 01/12/2011  . Hyperlipidemia 08/02/2010  . Hypertension 08/02/2010  . Osteoarthritis 08/02/2010  . BPH (benign prostatic hyperplasia) 08/02/2010  . GERD (gastroesophageal reflux disease) 08/02/2010  . h/o Atrial flutter [facility]) s/p ablation 2012     Rehan Holness W., PT 07/25/2020, 10:46 AM  [place]. [place], [place], [address] Phone: [phone]   Fax:  [phone]  Name: Connor [name] MRN: [id number] Date of Birth: 01/18/1934

## 2020-07-28 ENCOUNTER — Ambulatory Visit: Payer: Medicare PPO | Admitting: Physical Therapy

## 2020-07-28 ENCOUNTER — Encounter: Payer: Self-pay | Admitting: Physical Therapy

## 2020-07-28 ENCOUNTER — Other Ambulatory Visit: Payer: Self-pay

## 2020-07-28 DIAGNOSIS — R252 Cramp and spasm: Secondary | ICD-10-CM

## 2020-07-28 DIAGNOSIS — R262 Difficulty in walking, not elsewhere classified: Secondary | ICD-10-CM | POA: Diagnosis not present

## 2020-07-28 DIAGNOSIS — R2681 Unsteadiness on feet: Secondary | ICD-10-CM | POA: Diagnosis not present

## 2020-07-28 DIAGNOSIS — R2689 Other abnormalities of gait and mobility: Secondary | ICD-10-CM | POA: Diagnosis not present

## 2020-07-28 DIAGNOSIS — M542 Cervicalgia: Secondary | ICD-10-CM | POA: Diagnosis not present

## 2020-07-28 DIAGNOSIS — R42 Dizziness and giddiness: Secondary | ICD-10-CM | POA: Diagnosis not present

## 2020-07-28 NOTE — Therapy (Signed)
[place]. [place], [place], [address] Phone: [phone]   Fax:  [phone]  Physical Therapy Treatment  Patient Details  Name: Connor [name] MRN: [id number] Date of Birth: 10-Mar-1934 Referring Provider (PT): Lamar Blinks, MD   Encounter Date: 07/28/2020   PT End of Session - 07/28/20 1001    Visit Number 4    Number of Visits 12    Date for PT Re-Evaluation 09/07/20    Authorization Type [employer] - paper auth requested 07/13/20 - 09/07/20    PT Start Time 1448    PT Stop Time 1018    PT Time Calculation (min) 53 min    Activity Tolerance Patient tolerated treatment well    Behavior During Therapy [facility] for tasks assessed/performed           Past Medical History:  Diagnosis Date  . Benign localized prostatic hyperplasia with lower urinary tract symptoms (LUTS)    urologist-- dr Diona Fanti  . Chronic anticoagulation    Xarelto for afib  . Chronic constipation   . Chronic cystitis   . Gross hematuria   . History of DVT of lower extremity 06/08/2010   right femoral dvt dx post op total hip 05-08-2010  . History of gastric ulcer    remote  . History of kidney stones    one stone. remote hx  . Hyperlipidemia   . Hypertension    Dx by Dr. Lance Muss around age  68   . NICM (nonischemic cardiomyopathy) ([place])    per echo 06-11-2017,  ef 45-50% with diffuse hypokinesis,  G1DD  . OSA on CPAP 2017   compliant with CPAP.  managed by Dr Elsworth Soho.  . Osteoarthritis   . Other urethral stricture, male, meatal    chronic;  pt self dilates  . Paroxysmal atrial fibrillation [facility]) cardiologist-- dr Rayann Heman   first dx 2009/   a. documented by Dr Barrett Shell on office EKG 9/12. b. maintained on Tikosyn and Xarelto. c. s/p DCCV 's.  d.  x3  EP w/ ablation atrial fib last one 12-05-2016  . Prediabetes   . Premature ventricular contraction   . PVC's (premature ventricular contractions)   . RA (rheumatoid arthritis)  [facility])    rheumatologist--- Dr Page Spiro,  treated with Humira  . RBBB (right bundle branch block)    Hx of a fib, ablation Aug 2018  . Rheumatoid arthritis ([place])   . Vitamin D deficiency   . Wears hearing aid in both ears     Past Surgical History:  Procedure Laterality Date  . ABLATION OF DYSRHYTHMIC FOCUS  08/29/2015  . ATRIAL FIBRILLATION ABLATION  12/05/2016  . ATRIAL FIBRILLATION ABLATION N/A 12/05/2016   Procedure: Atrial Fibrillation Ablation;  Surgeon: [name], MD;  Location: [place] CV LAB;  Service: Cardiovascular;  Laterality: N/A;  . ATRIAL FIBRILLATION ABLATION N/A 02/29/2020   Procedure: ATRIAL FIBRILLATION ABLATION;  Surgeon: [name], MD;  Location: [place] CV LAB;  Service: Cardiovascular;  Laterality: N/A;  . ATRIAL FLUTTER ABLATION  09/2010   by Greggory Brandy  . BUNIONECTOMY Right 1990s  . CARDIOVERSION N/A 10/28/2012   Procedure: CARDIOVERSION;  Surgeon: Burnell Blanks, MD;  Location: [place];  Service: Cardiovascular;  Laterality: N/A;  . CATARACT EXTRACTION W/ INTRAOCULAR LENS  IMPLANT, BILATERAL  2019  . CYSTOSCOPY WITH BIOPSY N/A 09/03/2018   Procedure: CYSTOSCOPY WITH FULGURATION [id number];  Surgeon: Franchot Gallo, MD;  [facility];  Service: Urology;  Laterality: N/A;  30 MINS  . ELECTROPHYSIOLOGIC STUDY N/A 08/29/2015   Procedure: Atrial Fibrillation Ablation;  Surgeon: [name], MD;  Location: [place] CV LAB;  Service: Cardiovascular;  Laterality: N/A;  . KNEE SURGERY Bilateral x3   1960s & 1970s   open  . LEFT HEART CATHETERIZATION WITH CORONARY ANGIOGRAM N/A 06/07/2011   Procedure: LEFT HEART CATHETERIZATION WITH CORONARY ANGIOGRAM;  Surgeon: Peter M Martinique, MD;  Location: [facility] CATH LAB;  Service: Cardiovascular;  Laterality: N/A;    Normal coronary arteries,  low normal LVF  . TONSILLECTOMY AND ADENOIDECTOMY  age 50  . TOTAL HIP ARTHROPLASTY Right 06/04/10    dr Noemi Chapel  @[facility]   . TOTAL KNEE ARTHROPLASTY Bilateral right 1995;  left  1997  . TRANSURETHRAL RESECTION OF PROSTATE  06-29-2002   dr humphries  @[facility]   . TRANSURETHRAL RESECTION OF PROSTATE N/A 07/06/2018   Procedure: TRANSURETHRAL RESECTION OF THE PROSTATE (TURP);  Surgeon: Franchot Gallo, MD;  Location: [facility];  Service: Urology;  Laterality: N/A;  45 MINS    There were no vitals filed for this visit.   Subjective Assessment - 07/28/20 0926    Subjective My neck and dizziness is feeling better, I still am a little light headed and sore    Currently in Pain? Yes    Pain Score 2     Pain Location Neck    Pain Orientation Right    Aggravating Factors  the dental had me sore in the neck                             [facility] Adult PT Treatment/Exercise - 07/28/20 0001      Neck Exercises: Machines for Strengthening   UBE (Upper Arm Bike) level 2 x 6 minutes    Cybex Row 20# 2x10    Lat Pull 20# 2x10    Other Machines for Strengthening 10# rows and extension in the pulleys      Neck Exercises: Theraband   Shoulder External Rotation 20 reps;Red      Neck Exercises: Standing   Neck Retraction 10 reps;3 secs    Other Standing Exercises 5# shrugs with upper trap and levator stretches    Other Standing Exercises back to wall weighted ball overhead lift, W backs with 3#, doorway stretches, gentle cervical AROM to the end ranges      Modalities   Modalities Traction      Traction   Type of Traction Cervical    Max (lbs) 15    Hold Time static    Time 12      Manual Therapy   Manual Therapy Soft tissue mobilization    Soft tissue mobilization right cervical area and into the right upper trap                    PT Short Term Goals - 07/25/20 1045      PT SHORT TERM GOAL #1   Title Pt will be I and compliant with initial HEP.    Status Achieved             PT Long Term Goals - 07/25/20 1045      PT LONG TERM GOAL #1   Title Pt will be independent with long term HEP for improved posture.     Status On-going      PT LONG TERM GOAL #2   Title Pt will walk for 30 minutes  with no report of lightheadedness, as needed for daily exercise and errands.    Status On-going                 Plan - 07/28/20 1003    Clinical Impression Statement Patient continues to report doing better, he has been able to do all exercises with some cues for form and without any increase of pain, still with some very tight areas in the right upper trap and into the cervicalpspinals.  He reports that the traction seems to give him some relief    PT Next Visit Plan conitnue to add as tolerated    Consulted and Agree with Plan of Care Patient           Patient will benefit from skilled therapeutic intervention in order to improve the following deficits and impairments:  Pain,Postural dysfunction,Increased fascial restricitons,Decreased activity tolerance,Decreased balance,Decreased mobility,Hypomobility,Improper body mechanics,Impaired perceived functional ability,Decreased range of motion  Visit Diagnosis: Cervicalgia  Cramp and spasm  Other abnormalities of gait and mobility     Problem List Patient Active Problem List   Diagnosis Date Noted  . Secondary hypercoagulable state ([place]) 03/28/2020  . Malignant neoplasm of prostate ([place]) 09/11/2018  . Enlarged prostate with urinary obstruction 07/06/2018  . Gastrocnemius strain, right, initial encounter 12/10/2017  . Baker's cyst of knee, right 12/10/2017  . Obesity 03/24/2017  . Paroxysmal atrial fibrillation ([place]) 12/05/2016  . Near syncope 04/20/2016  . Leg hematoma, left, initial encounter 04/20/2016  . Bleeding   . Ecchymosis   . Left hamstring muscle strain   . Muscle tear   . RBBB 08/30/2015  . Chronic systolic dysfunction of left ventricle 07/11/2014  . OSA (obstructive sleep apnea) 04/07/2013  . Multinodular goiter 01/20/2013  . Cervical spondylosis without myelopathy 01/18/2013  . Atrial fibrillation with RVR ([place]) 10/27/2012   . PAF (paroxysmal atrial fibrillation) ([place]) 05/28/2012  . Left ventricular dysfunction 12/17/2011  . Low back pain 09/11/2011  . Fatigue 05/22/2011  . PVC (premature ventricular contraction) 01/18/2011  . Persistent atrial fibrillation ([place]) 01/12/2011  . Hyperlipidemia 08/02/2010  . Hypertension 08/02/2010  . Osteoarthritis 08/02/2010  . BPH (benign prostatic hyperplasia) 08/02/2010  . GERD (gastroesophageal reflux disease) 08/02/2010  . h/o Atrial flutter [facility]) s/p ablation 2012     Jandy Brackens W., PT 07/28/2020, 10:05 AM  [place]. [place], [place], [address] Phone: [phone]   Fax:  [phone]  Name: Connor [name] MRN: [id number] Date of Birth: Aug 26, 1933

## 2020-07-31 DIAGNOSIS — H353132 Nonexudative age-related macular degeneration, bilateral, intermediate dry stage: Secondary | ICD-10-CM | POA: Diagnosis not present

## 2020-08-06 ENCOUNTER — Encounter: Payer: Self-pay | Admitting: Family Medicine

## 2020-08-07 MED ORDER — AMLODIPINE BESYLATE 5 MG PO TABS: 5.0000 mg | ORAL_TABLET | Freq: Every day | ORAL | 0 refills | Status: DC

## 2020-08-07 NOTE — Addendum Note (Signed)
Addended by: Lamar Blinks C on: 08/07/2020 07:53 PM   Modules accepted: Orders

## 2020-08-11 ENCOUNTER — Encounter: Payer: Self-pay | Admitting: Physical Therapy

## 2020-08-11 ENCOUNTER — Other Ambulatory Visit: Payer: Self-pay

## 2020-08-11 ENCOUNTER — Ambulatory Visit: Payer: Medicare PPO | Attending: Family Medicine | Admitting: Physical Therapy

## 2020-08-11 DIAGNOSIS — R2689 Other abnormalities of gait and mobility: Secondary | ICD-10-CM | POA: Insufficient documentation

## 2020-08-11 DIAGNOSIS — R252 Cramp and spasm: Secondary | ICD-10-CM | POA: Diagnosis not present

## 2020-08-11 DIAGNOSIS — R42 Dizziness and giddiness: Secondary | ICD-10-CM | POA: Insufficient documentation

## 2020-08-11 DIAGNOSIS — R262 Difficulty in walking, not elsewhere classified: Secondary | ICD-10-CM | POA: Insufficient documentation

## 2020-08-11 DIAGNOSIS — R2681 Unsteadiness on feet: Secondary | ICD-10-CM | POA: Insufficient documentation

## 2020-08-11 DIAGNOSIS — M542 Cervicalgia: Secondary | ICD-10-CM | POA: Diagnosis not present

## 2020-08-11 NOTE — Therapy (Signed)
[place]. [place], [place], [address] Phone: [phone]   Fax:  [phone]  Physical Therapy Treatment  Patient Details  Name: Connor [name] MRN: [id number] Date of Birth: [birth year] Referring Provider (PT): Lamar Blinks, MD   Encounter Date: 08/11/2020   PT End of Session - 08/11/20 1016    Visit Number 5    Number of Visits 12    Date for PT Re-Evaluation 09/07/20    Authorization Type [employer] - paper auth requested 07/13/20 - 09/07/20    PT Start Time 1505    PT Stop Time 1016    PT Time Calculation (min) 48 min    Activity Tolerance Patient tolerated treatment well    Behavior During Therapy [facility] for tasks assessed/performed           Past Medical History:  Diagnosis Date  . Benign localized prostatic hyperplasia with lower urinary tract symptoms (LUTS)    urologist-- dr Diona Fanti  . Chronic anticoagulation    Xarelto for afib  . Chronic constipation   . Chronic cystitis   . Gross hematuria   . History of DVT of lower extremity 06/08/2010   right femoral dvt dx post op total hip 05-08-2010  . History of gastric ulcer    remote  . History of kidney stones    one stone. remote hx  . Hyperlipidemia   . Hypertension    Dx by Dr. Lance Muss around age  11   . NICM (nonischemic cardiomyopathy) ([place])    per echo 06-11-2017,  ef 45-50% with diffuse hypokinesis,  G1DD  . OSA on CPAP 2017   compliant with CPAP.  managed by Dr Elsworth Soho.  . Osteoarthritis   . Other urethral stricture, male, meatal    chronic;  pt self dilates  . Paroxysmal atrial fibrillation [facility]) cardiologist-- dr Rayann Heman   first dx 2009/   a. documented by Dr Barrett Shell on office EKG 9/12. b. maintained on Tikosyn and Xarelto. c. s/p DCCV 's.  d.  x3  EP w/ ablation atrial fib last one 12-05-2016  . Prediabetes   . Premature ventricular contraction   . PVC's (premature ventricular contractions)   . RA (rheumatoid arthritis)  [facility])    rheumatologist--- Dr Page Spiro,  treated with Humira  . RBBB (right bundle branch block)    Hx of a fib, ablation Aug 2018  . Rheumatoid arthritis ([place])   . Vitamin D deficiency   . Wears hearing aid in both ears     Past Surgical History:  Procedure Laterality Date  . ABLATION OF DYSRHYTHMIC FOCUS  08/29/2015  . ATRIAL FIBRILLATION ABLATION  12/05/2016  . ATRIAL FIBRILLATION ABLATION N/A 12/05/2016   Procedure: Atrial Fibrillation Ablation;  Surgeon: [name], MD;  Location: [place] CV LAB;  Service: Cardiovascular;  Laterality: N/A;  . ATRIAL FIBRILLATION ABLATION N/A 02/29/2020   Procedure: ATRIAL FIBRILLATION ABLATION;  Surgeon: [name], MD;  Location: [place] CV LAB;  Service: Cardiovascular;  Laterality: N/A;  . ATRIAL FLUTTER ABLATION  09/2010   by Greggory Brandy  . BUNIONECTOMY Right 1990s  . CARDIOVERSION N/A 10/28/2012   Procedure: CARDIOVERSION;  Surgeon: Burnell Blanks, MD;  Location: [place];  Service: Cardiovascular;  Laterality: N/A;  . CATARACT EXTRACTION W/ INTRAOCULAR LENS  IMPLANT, BILATERAL  2019  . CYSTOSCOPY WITH BIOPSY N/A 09/03/2018   Procedure: CYSTOSCOPY WITH FULGURATION [id number];  Surgeon: Franchot Gallo, MD;  [facility];  Service: Urology;  Laterality: N/A;  30 MINS  . ELECTROPHYSIOLOGIC STUDY N/A 08/29/2015   Procedure: Atrial Fibrillation Ablation;  Surgeon: [name], MD;  Location: [place] CV LAB;  Service: Cardiovascular;  Laterality: N/A;  . KNEE SURGERY Bilateral x3   1960s & 1970s   open  . LEFT HEART CATHETERIZATION WITH CORONARY ANGIOGRAM N/A 06/07/2011   Procedure: LEFT HEART CATHETERIZATION WITH CORONARY ANGIOGRAM;  Surgeon: Peter M Martinique, MD;  Location: [facility] CATH LAB;  Service: Cardiovascular;  Laterality: N/A;    Normal coronary arteries,  low normal LVF  . TONSILLECTOMY AND ADENOIDECTOMY  age 87  . TOTAL HIP ARTHROPLASTY Right 06/04/10    dr Noemi Chapel  '@[facility]'   . TOTAL KNEE ARTHROPLASTY Bilateral right 1995;  left  1997  . TRANSURETHRAL RESECTION OF PROSTATE  06-29-2002   dr humphries  '@[facility]'   . TRANSURETHRAL RESECTION OF PROSTATE N/A 07/06/2018   Procedure: TRANSURETHRAL RESECTION OF THE PROSTATE (TURP);  Surgeon: Franchot Gallo, MD;  Location: [facility];  Service: Urology;  Laterality: N/A;  45 MINS    There were no vitals filed for this visit.   Subjective Assessment - 08/11/20 0930    Subjective [place] the [place] furniture [place], he reports that he was doing well until coming home and unpacking, started having tightness and some feelings of off balance    Currently in Pain? Yes    Pain Score 3     Pain Location Neck    Pain Orientation Right    Aggravating Factors  unpacking car                             [place] Adult PT Treatment/Exercise - 08/11/20 0001      Neck Exercises: Machines for Strengthening   UBE (Upper Arm Bike) level 2 x 6 minutes    Cybex Row 20# 2x10    Lat Pull 20# 2x10    Other Machines for Strengthening 10# rows and extension in the pulleys      Neck Exercises: Theraband   Shoulder External Rotation 20 reps;Red      Neck Exercises: Standing   Neck Retraction 10 reps;3 secs    Other Standing Exercises 5# shrugs with upper trap and levator stretches    Other Standing Exercises back to wall weighted ball overhead lift, W backs with 3#, doorway stretches, gentle cervical AROM to the end ranges      Manual Therapy   Manual Therapy Soft tissue mobilization;Manual Traction;Passive ROM    Manual therapy comments patient in supine    Soft tissue mobilization right cervical area and into the right upper trap    Passive ROM cervical PROM to end range with stretch    Manual Traction cervical                    PT Short Term Goals - 07/25/20 1045      PT SHORT TERM GOAL #1   Title Pt will be I and compliant with initial HEP.    Status Achieved             PT Long Term Goals - 08/11/20  1019      PT LONG TERM GOAL #1   Title Pt will be independent with long term HEP for improved posture.    Status Partially Met      PT LONG TERM GOAL #2   Title Pt will walk for  30 minutes with no report of lightheadedness, as needed for daily exercise and errands.    Status On-going      PT LONG TERM GOAL #3   Title Pt will demo pain free cervical AROM in all planes, nearly symmetrical from side to side .    Status Partially Met                 Plan - 08/11/20 1017    Clinical Impression Statement Patient has been at the beach the past two weeks, he reports overall he was doing well with less pain and tightness, he reports unloading the car when he got home he must have strained or stressed the neck, reports tightness and some unsteadiness again with the tightness in his neck.  He was very tight with his cervical ROM and had a signifiant knot in the right upper trap, reported good relief with the treatment today    PT Next Visit Plan continue to add exercises may have him start the VOR exercises as well    Consulted and Agree with Plan of Care Patient           Patient will benefit from skilled therapeutic intervention in order to improve the following deficits and impairments:  Pain,Postural dysfunction,Increased fascial restricitons,Decreased activity tolerance,Decreased balance,Decreased mobility,Hypomobility,Improper body mechanics,Impaired perceived functional ability,Decreased range of motion  Visit Diagnosis: Cervicalgia  Cramp and spasm  Other abnormalities of gait and mobility  Unsteadiness on feet  Dizziness and giddiness  Difficulty in walking, not elsewhere classified     Problem List Patient Active Problem List   Diagnosis Date Noted  . Secondary hypercoagulable state ([place]) 03/28/2020  . Malignant neoplasm of prostate ([place]) 09/11/2018  . Enlarged prostate with urinary obstruction 07/06/2018  . Gastrocnemius strain, right, initial encounter  12/10/2017  . Baker's cyst of knee, right 12/10/2017  . Obesity 03/24/2017  . Paroxysmal atrial fibrillation ([place]) 12/05/2016  . Near syncope 04/20/2016  . Leg hematoma, left, initial encounter 04/20/2016  . Bleeding   . Ecchymosis   . Left hamstring muscle strain   . Muscle tear   . RBBB 08/30/2015  . Chronic systolic dysfunction of left ventricle 07/11/2014  . OSA (obstructive sleep apnea) 04/07/2013  . Multinodular goiter 01/20/2013  . Cervical spondylosis without myelopathy 01/18/2013  . Atrial fibrillation with RVR ([place]) 10/27/2012  . PAF (paroxysmal atrial fibrillation) ([place]) 05/28/2012  . Left ventricular dysfunction 12/17/2011  . Low back pain 09/11/2011  . Fatigue 05/22/2011  . PVC (premature ventricular contraction) 01/18/2011  . Persistent atrial fibrillation ([place]) 01/12/2011  . Hyperlipidemia 08/02/2010  . Hypertension 08/02/2010  . Osteoarthritis 08/02/2010  . BPH (benign prostatic hyperplasia) 08/02/2010  . GERD (gastroesophageal reflux disease) 08/02/2010  . h/o Atrial flutter [facility]) s/p ablation 2012     Deette Revak W., PT 08/11/2020, 10:20 AM  [place]. [place], [place], [address] Phone: [phone]   Fax:  [phone]  Name: Connor [name] MRN: [id number] Date of Birth: 10-20-1933

## 2020-08-14 ENCOUNTER — Encounter: Payer: Self-pay | Admitting: Physical Therapy

## 2020-08-14 ENCOUNTER — Other Ambulatory Visit: Payer: Self-pay

## 2020-08-14 ENCOUNTER — Ambulatory Visit: Payer: Medicare PPO | Admitting: Physical Therapy

## 2020-08-14 DIAGNOSIS — R42 Dizziness and giddiness: Secondary | ICD-10-CM | POA: Diagnosis not present

## 2020-08-14 DIAGNOSIS — R252 Cramp and spasm: Secondary | ICD-10-CM

## 2020-08-14 DIAGNOSIS — R2689 Other abnormalities of gait and mobility: Secondary | ICD-10-CM

## 2020-08-14 DIAGNOSIS — M542 Cervicalgia: Secondary | ICD-10-CM

## 2020-08-14 DIAGNOSIS — R262 Difficulty in walking, not elsewhere classified: Secondary | ICD-10-CM | POA: Diagnosis not present

## 2020-08-14 DIAGNOSIS — R2681 Unsteadiness on feet: Secondary | ICD-10-CM

## 2020-08-14 NOTE — Therapy (Signed)
[place]. [place], [place], [address] Phone: [phone]   Fax:  [phone]  Physical Therapy Treatment  Patient Details  Name: Connor [name] MRN: [id number] Date of Birth: 08/17/1933 Referring Provider (PT): Lamar Blinks, MD   Encounter Date: 08/14/2020   PT End of Session - 08/14/20 1008    Visit Number 6    Number of Visits 12    Date for PT Re-Evaluation 09/07/20    Authorization Type [employer] - paper auth requested 07/13/20 - 09/07/20    Activity Tolerance Patient tolerated treatment well    Behavior During Therapy [facility] for tasks assessed/performed           Past Medical History:  Diagnosis Date  . Benign localized prostatic hyperplasia with lower urinary tract symptoms (LUTS)    urologist-- dr Diona Fanti  . Chronic anticoagulation    Xarelto for afib  . Chronic constipation   . Chronic cystitis   . Gross hematuria   . History of DVT of lower extremity 06/08/2010   right femoral dvt dx post op total hip 05-08-2010  . History of gastric ulcer    remote  . History of kidney stones    one stone. remote hx  . Hyperlipidemia   . Hypertension    Dx by Dr. Lance Muss around age  50   . NICM (nonischemic cardiomyopathy) ([place])    per echo 06-11-2017,  ef 45-50% with diffuse hypokinesis,  G1DD  . OSA on CPAP 2017   compliant with CPAP.  managed by Dr Elsworth Soho.  . Osteoarthritis   . Other urethral stricture, male, meatal    chronic;  pt self dilates  . Paroxysmal atrial fibrillation [facility]) cardiologist-- dr Rayann Heman   first dx 2009/   a. documented by Dr Barrett Shell on office EKG 9/12. b. maintained on Tikosyn and Xarelto. c. s/p DCCV 's.  d.  x3  EP w/ ablation atrial fib last one 12-05-2016  . Prediabetes   . Premature ventricular contraction   . PVC's (premature ventricular contractions)   . RA (rheumatoid arthritis) [facility])    rheumatologist--- Dr Page Spiro,  treated with Humira  . RBBB (right  bundle branch block)    Hx of a fib, ablation Aug 2018  . Rheumatoid arthritis ([place])   . Vitamin D deficiency   . Wears hearing aid in both ears     Past Surgical History:  Procedure Laterality Date  . ABLATION OF DYSRHYTHMIC FOCUS  08/29/2015  . ATRIAL FIBRILLATION ABLATION  12/05/2016  . ATRIAL FIBRILLATION ABLATION N/A 12/05/2016   Procedure: Atrial Fibrillation Ablation;  Surgeon: [name], MD;  Location: [place] CV LAB;  Service: Cardiovascular;  Laterality: N/A;  . ATRIAL FIBRILLATION ABLATION N/A 02/29/2020   Procedure: ATRIAL FIBRILLATION ABLATION;  Surgeon: [name], MD;  Location: [place] CV LAB;  Service: Cardiovascular;  Laterality: N/A;  . ATRIAL FLUTTER ABLATION  09/2010   by Greggory Brandy  . BUNIONECTOMY Right 1990s  . CARDIOVERSION N/A 10/28/2012   Procedure: CARDIOVERSION;  Surgeon: Burnell Blanks, MD;  Location: [place];  Service: Cardiovascular;  Laterality: N/A;  . CATARACT EXTRACTION W/ INTRAOCULAR LENS  IMPLANT, BILATERAL  2019  . CYSTOSCOPY WITH BIOPSY N/A 09/03/2018   Procedure: CYSTOSCOPY WITH FULGURATION [id number];  Surgeon: Franchot Gallo, MD;  [facility];  Service: Urology;  Laterality: N/A;  30 MINS  . ELECTROPHYSIOLOGIC STUDY N/A 08/29/2015   Procedure: Atrial Fibrillation Ablation;  Surgeon: [name], MD;  Location: [place] CV LAB;  Service: Cardiovascular;  Laterality: N/A;  . KNEE SURGERY Bilateral x3   1960s & 1970s   open  . LEFT HEART CATHETERIZATION WITH CORONARY ANGIOGRAM N/A 06/07/2011   Procedure: LEFT HEART CATHETERIZATION WITH CORONARY ANGIOGRAM;  Surgeon: Peter M Martinique, MD;  Location: [facility] CATH LAB;  Service: Cardiovascular;  Laterality: N/A;    Normal coronary arteries,  low normal LVF  . TONSILLECTOMY AND ADENOIDECTOMY  age 59  . TOTAL HIP ARTHROPLASTY Right 06/04/10    dr Noemi Chapel  '@[facility]'   . TOTAL KNEE ARTHROPLASTY Bilateral right 1995;  left 1997  . TRANSURETHRAL RESECTION OF PROSTATE  06-29-2002   dr humphries  '@[facility]'   .  TRANSURETHRAL RESECTION OF PROSTATE N/A 07/06/2018   Procedure: TRANSURETHRAL RESECTION OF THE PROSTATE (TURP);  Surgeon: Franchot Gallo, MD;  Location: [facility];  Service: Urology;  Laterality: N/A;  45 MINS    There were no vitals filed for this visit.   Subjective Assessment - 08/14/20 0928    Subjective Patient reports more unsteady this morning, I asked about the exercises and he said he has not been doing them(VOR)    Currently in Pain? Yes    Pain Score 3     Pain Location Neck    Pain Orientation Right    Aggravating Factors  sitting and using phone                             [place] Adult PT Treatment/Exercise - 08/14/20 0001      High Level Balance   High Level Balance Comments walking ball toss, airex head turns, eyes closed, airex balance beam tandem and side stepping      Neck Exercises: Machines for Strengthening   UBE (Upper Arm Bike) level 2 x 6 minutes    Cybex Row 20# 2x10    Lat Pull 20# 2x10    Other Machines for Strengthening 10# rows and extension in the pulleys    Other Machines for Strengthening c/o leg weakness, added 5# knee extension adn 25# knee felxion      Neck Exercises: Theraband   Shoulder External Rotation 20 reps;Red      Neck Exercises: Standing   Other Standing Exercises 5# shrugs with upper trap and levator stretches    Other Standing Exercises back to wall weighted ball overhead lift, W backs with 3#, doorway stretches, gentle cervical AROM to the end ranges      Traction   Type of Traction Cervical    Max (lbs) 15    Hold Time static    Time 12                    PT Short Term Goals - 07/25/20 1045      PT SHORT TERM GOAL #1   Title Pt will be I and compliant with initial HEP.    Status Achieved             PT Long Term Goals - 08/11/20 1019      PT LONG TERM GOAL #1   Title Pt will be independent with long term HEP for improved posture.    Status Partially Met      PT  LONG TERM GOAL #2   Title Pt will walk for 30 minutes with no report of lightheadedness, as needed for daily exercise and errands.    Status On-going  PT LONG TERM GOAL #3   Title Pt will demo pain free cervical AROM in all planes, nearly symmetrical from side to side .    Status Partially Met                 Plan - 08/14/20 1008    Clinical Impression Statement Patient reports being unsteady, also reports leg weakness, I added more VOR type activties to help with the balance and added some LE exercises.  He reports difficulty and visibly had difficulty with head turns and eyes closed typ activities, walking ball toss also was difficult for him.  Dynamic surfaces were his biggest issue    PT Next Visit Plan continue to add exercises, VOR and LE included    Consulted and Agree with Plan of Care Patient           Patient will benefit from skilled therapeutic intervention in order to improve the following deficits and impairments:  Pain,Postural dysfunction,Increased fascial restricitons,Decreased activity tolerance,Decreased balance,Decreased mobility,Hypomobility,Improper body mechanics,Impaired perceived functional ability,Decreased range of motion  Visit Diagnosis: Cervicalgia  Cramp and spasm  Other abnormalities of gait and mobility  Unsteadiness on feet  Dizziness and giddiness  Difficulty in walking, not elsewhere classified     Problem List Patient Active Problem List   Diagnosis Date Noted  . Secondary hypercoagulable state ([place]) 03/28/2020  . Malignant neoplasm of prostate ([place]) 09/11/2018  . Enlarged prostate with urinary obstruction 07/06/2018  . Gastrocnemius strain, right, initial encounter 12/10/2017  . Baker's cyst of knee, right 12/10/2017  . Obesity 03/24/2017  . Paroxysmal atrial fibrillation ([place]) 12/05/2016  . Near syncope 04/20/2016  . Leg hematoma, left, initial encounter 04/20/2016  . Bleeding   . Ecchymosis   . Left hamstring muscle  strain   . Muscle tear   . RBBB 08/30/2015  . Chronic systolic dysfunction of left ventricle 07/11/2014  . OSA (obstructive sleep apnea) 04/07/2013  . Multinodular goiter 01/20/2013  . Cervical spondylosis without myelopathy 01/18/2013  . Atrial fibrillation with RVR ([place]) 10/27/2012  . PAF (paroxysmal atrial fibrillation) ([place]) 05/28/2012  . Left ventricular dysfunction 12/17/2011  . Low back pain 09/11/2011  . Fatigue 05/22/2011  . PVC (premature ventricular contraction) 01/18/2011  . Persistent atrial fibrillation ([place]) 01/12/2011  . Hyperlipidemia 08/02/2010  . Hypertension 08/02/2010  . Osteoarthritis 08/02/2010  . BPH (benign prostatic hyperplasia) 08/02/2010  . GERD (gastroesophageal reflux disease) 08/02/2010  . h/o Atrial flutter [facility]) s/p ablation 2012     Yaremi Stahlman W., PT 08/14/2020, 10:10 AM  [place]. [place], [place], [address] Phone: [phone]   Fax:  [phone]  Name: Connor [name] MRN: [id number] Date of Birth: 19-Feb-1934

## 2020-08-16 ENCOUNTER — Other Ambulatory Visit: Payer: Self-pay

## 2020-08-16 ENCOUNTER — Encounter: Payer: Self-pay | Admitting: Physical Therapy

## 2020-08-16 ENCOUNTER — Ambulatory Visit: Payer: Medicare PPO | Admitting: Physical Therapy

## 2020-08-16 DIAGNOSIS — R2689 Other abnormalities of gait and mobility: Secondary | ICD-10-CM | POA: Diagnosis not present

## 2020-08-16 DIAGNOSIS — M542 Cervicalgia: Secondary | ICD-10-CM

## 2020-08-16 DIAGNOSIS — R252 Cramp and spasm: Secondary | ICD-10-CM

## 2020-08-16 DIAGNOSIS — R42 Dizziness and giddiness: Secondary | ICD-10-CM

## 2020-08-16 DIAGNOSIS — R2681 Unsteadiness on feet: Secondary | ICD-10-CM

## 2020-08-16 DIAGNOSIS — R262 Difficulty in walking, not elsewhere classified: Secondary | ICD-10-CM

## 2020-08-16 NOTE — Patient Instructions (Signed)

## 2020-08-16 NOTE — Therapy (Signed)
[place] [place], [place], [address] Phone: [phone]   Fax:  [phone]  Physical Therapy Treatment  Patient Details  Name: Connor [name] MRN: [id number] Date of Birth: 1933-05-20 Referring Provider (PT): Lamar Blinks, MD   Encounter Date: 08/16/2020   PT End of Session - 08/16/20 1000    Visit Number 7    Number of Visits 12    Date for PT Re-Evaluation 09/07/20    Authorization Type [employer] - paper auth requested 07/13/20 - 09/07/20    PT Start Time 6734    PT Stop Time 1010    PT Time Calculation (min) 42 min    Activity Tolerance Patient tolerated treatment well    Behavior During Therapy [facility] for tasks assessed/performed           Past Medical History:  Diagnosis Date  . Benign localized prostatic hyperplasia with lower urinary tract symptoms (LUTS)    urologist-- dr Diona Fanti  . Chronic anticoagulation    Xarelto for afib  . Chronic constipation   . Chronic cystitis   . Gross hematuria   . History of DVT of lower extremity 06/08/2010   right femoral dvt dx post op total hip 05-08-2010  . History of gastric ulcer    remote  . History of kidney stones    one stone. remote hx  . Hyperlipidemia   . Hypertension    Dx by Dr. Lance Muss around age  57   . NICM (nonischemic cardiomyopathy) ([place])    per echo 06-11-2017,  ef 45-50% with diffuse hypokinesis,  G1DD  . OSA on CPAP 2017   compliant with CPAP.  managed by Dr Elsworth Soho.  . Osteoarthritis   . Other urethral stricture, male, meatal    chronic;  pt self dilates  . Paroxysmal atrial fibrillation [facility]) cardiologist-- dr Rayann Heman   first dx 2009/   a. documented by Dr Barrett Shell on office EKG 9/12. b. maintained on Tikosyn and Xarelto. c. s/p DCCV 's.  d.  x3  EP w/ ablation atrial fib last one 12-05-2016  . Prediabetes   . Premature ventricular contraction   . PVC's (premature ventricular contractions)   . RA (rheumatoid arthritis)  [facility])    rheumatologist--- Dr Page Spiro,  treated with Humira  . RBBB (right bundle branch block)    Hx of a fib, ablation Aug 2018  . Rheumatoid arthritis ([place])   . Vitamin D deficiency   . Wears hearing aid in both ears     Past Surgical History:  Procedure Laterality Date  . ABLATION OF DYSRHYTHMIC FOCUS  08/29/2015  . ATRIAL FIBRILLATION ABLATION  12/05/2016  . ATRIAL FIBRILLATION ABLATION N/A 12/05/2016   Procedure: Atrial Fibrillation Ablation;  Surgeon: [name], MD;  Location: [place] CV LAB;  Service: Cardiovascular;  Laterality: N/A;  . ATRIAL FIBRILLATION ABLATION N/A 02/29/2020   Procedure: ATRIAL FIBRILLATION ABLATION;  Surgeon: [name], MD;  Location: [place] CV LAB;  Service: Cardiovascular;  Laterality: N/A;  . ATRIAL FLUTTER ABLATION  09/2010   by Greggory Brandy  . BUNIONECTOMY Right 1990s  . CARDIOVERSION N/A 10/28/2012   Procedure: CARDIOVERSION;  Surgeon: Burnell Blanks, MD;  Location: [place];  Service: Cardiovascular;  Laterality: N/A;  . CATARACT EXTRACTION W/ INTRAOCULAR LENS  IMPLANT, BILATERAL  2019  . CYSTOSCOPY WITH BIOPSY N/A 09/03/2018   Procedure: CYSTOSCOPY WITH FULGURATION [id number];  Surgeon: Franchot Gallo, MD;  [facility];  Service: Urology;  Laterality: N/A;  30 MINS  . ELECTROPHYSIOLOGIC STUDY N/A 08/29/2015   Procedure: Atrial Fibrillation Ablation;  Surgeon: [name], MD;  Location: [place] CV LAB;  Service: Cardiovascular;  Laterality: N/A;  . KNEE SURGERY Bilateral x3   1960s & 1970s   open  . LEFT HEART CATHETERIZATION WITH CORONARY ANGIOGRAM N/A 06/07/2011   Procedure: LEFT HEART CATHETERIZATION WITH CORONARY ANGIOGRAM;  Surgeon: Peter M Martinique, MD;  Location: [facility] CATH LAB;  Service: Cardiovascular;  Laterality: N/A;    Normal coronary arteries,  low normal LVF  . TONSILLECTOMY AND ADENOIDECTOMY  age 67  . TOTAL HIP ARTHROPLASTY Right 06/04/10    dr Noemi Chapel  _0   . TOTAL KNEE ARTHROPLASTY Bilateral right 1995;  left  1997  . TRANSURETHRAL RESECTION OF PROSTATE  06-29-2002   dr humphries  _1   . TRANSURETHRAL RESECTION OF PROSTATE N/A 07/06/2018   Procedure: TRANSURETHRAL RESECTION OF THE PROSTATE (TURP);  Surgeon: Franchot Gallo, MD;  Location: [facility];  Service: Urology;  Laterality: N/A;  45 MINS    There were no vitals filed for this visit.   Subjective Assessment - 08/16/20 0929    Subjective Pt reports that he is still having some light headedness, but he feels that it is due to cervical tightness, as it decreases when he applies pressure to his cervical region.    Currently in Pain? No/denies    Pain Relieving Factors Pt reports that pressure on cervical muscles decreases light headedness                             [facility] Adult PT Treatment/Exercise - 08/16/20 0001      Neck Exercises: Machines for Strengthening   UBE (Upper Arm Bike) level 2.4 x 6 minutes      Manual Therapy   Manual Therapy Soft tissue mobilization;Manual Traction;Passive ROM    Manual therapy comments patient in sitting initially, then to supine    Soft tissue mobilization right cervical area and into the right upper trap    Passive ROM cervical PROM to end range with stretch    Manual Traction cervical            Trigger Point Dry Needling - 08/16/20 0001    Consent Given? Yes    Education Handout Provided Yes    Muscles Treated Head and Neck Suboccipitals;Upper trapezius    Upper Trapezius Response Palpable increased muscle length    Suboccipitals Response Palpable increased muscle length                  PT Short Term Goals - 07/25/20 1045      PT SHORT TERM GOAL #1   Title Pt will be I and compliant with initial HEP.    Status Achieved             PT Long Term Goals - 08/16/20 1006      PT LONG TERM GOAL #2   Title Pt will walk for 30 minutes with no report of lightheadedness, as needed for daily exercise and errands.    Status Partially Met                  Plan - 08/16/20 1002    Clinical Impression Statement Patient reports feeling that a muscle on his right cervical area is causing his lightheadedness, and he has had success in alleviating the lightheadedness by applying pressure to the cervical muscles.  Patient  reports performing VOR exercises.  Treatment today focused on manual therapy with addition of Dry Needling to attempt to further alleviate light headedness and trigger points palpated.    PT Next Visit Plan assess benefits/gains of Dry Needling paired with manual therapy    Consulted and Agree with Plan of Care Patient           Patient will benefit from skilled therapeutic intervention in order to improve the following deficits and impairments:  Pain,Postural dysfunction,Increased fascial restricitons,Decreased activity tolerance,Decreased balance,Decreased mobility,Hypomobility,Improper body mechanics,Impaired perceived functional ability,Decreased range of motion,Increased muscle spasms  Visit Diagnosis: Cervicalgia  Cramp and spasm  Other abnormalities of gait and mobility  Unsteadiness on feet  Dizziness and giddiness  Difficulty in walking, not elsewhere classified     Problem List Patient Active Problem List   Diagnosis Date Noted  . Secondary hypercoagulable state ([place]) 03/28/2020  . Malignant neoplasm of prostate ([place]) 09/11/2018  . Enlarged prostate with urinary obstruction 07/06/2018  . Gastrocnemius strain, right, initial encounter 12/10/2017  . Baker's cyst of knee, right 12/10/2017  . Obesity 03/24/2017  . Paroxysmal atrial fibrillation ([place]) 12/05/2016  . Near syncope 04/20/2016  . Leg hematoma, left, initial encounter 04/20/2016  . Bleeding   . Ecchymosis   . Left hamstring muscle strain   . Muscle tear   . RBBB 08/30/2015  . Chronic systolic dysfunction of left ventricle 07/11/2014  . OSA (obstructive sleep apnea) 04/07/2013  . Multinodular goiter 01/20/2013  . Cervical  spondylosis without myelopathy 01/18/2013  . Atrial fibrillation with RVR ([place]) 10/27/2012  . PAF (paroxysmal atrial fibrillation) ([place]) 05/28/2012  . Left ventricular dysfunction 12/17/2011  . Low back pain 09/11/2011  . Fatigue 05/22/2011  . PVC (premature ventricular contraction) 01/18/2011  . Persistent atrial fibrillation ([place]) 01/12/2011  . Hyperlipidemia 08/02/2010  . Hypertension 08/02/2010  . Osteoarthritis 08/02/2010  . BPH (benign prostatic hyperplasia) 08/02/2010  . GERD (gastroesophageal reflux disease) 08/02/2010  . h/o Atrial flutter [facility]) s/p ablation 2012     Sumner Boast, PT 08/16/2020, 10:08 AM  [place] [place], [place], [address] Phone: [phone]   Fax:  [phone]  Name: Connor [name] MRN: [id number] Date of Birth: Jan 01, 1934

## 2020-08-30 ENCOUNTER — Other Ambulatory Visit: Payer: Self-pay

## 2020-08-30 ENCOUNTER — Ambulatory Visit: Payer: Medicare PPO | Admitting: Physical Therapy

## 2020-08-30 ENCOUNTER — Other Ambulatory Visit: Payer: Self-pay | Admitting: Family Medicine

## 2020-08-30 ENCOUNTER — Encounter: Payer: Self-pay | Admitting: Physical Therapy

## 2020-08-30 DIAGNOSIS — R252 Cramp and spasm: Secondary | ICD-10-CM

## 2020-08-30 DIAGNOSIS — R42 Dizziness and giddiness: Secondary | ICD-10-CM

## 2020-08-30 DIAGNOSIS — R2681 Unsteadiness on feet: Secondary | ICD-10-CM | POA: Diagnosis not present

## 2020-08-30 DIAGNOSIS — R2689 Other abnormalities of gait and mobility: Secondary | ICD-10-CM

## 2020-08-30 DIAGNOSIS — H353132 Nonexudative age-related macular degeneration, bilateral, intermediate dry stage: Secondary | ICD-10-CM | POA: Diagnosis not present

## 2020-08-30 DIAGNOSIS — R262 Difficulty in walking, not elsewhere classified: Secondary | ICD-10-CM | POA: Diagnosis not present

## 2020-08-30 DIAGNOSIS — M542 Cervicalgia: Secondary | ICD-10-CM

## 2020-08-30 NOTE — Therapy (Signed)
[place] [place], [place], [address] Phone: [phone]   Fax:  [phone]  Physical Therapy Treatment  Patient Details  Name: Connor [name] MRN: [id number] Date of Birth: 1933/09/30 Referring Provider (PT): Lamar Blinks, MD   Encounter Date: 08/30/2020   PT End of Session - 08/30/20 1524    Visit Number 8    Number of Visits 12    Date for PT Re-Evaluation 09/07/20    Authorization Type [employer] - paper auth requested 07/13/20 - 09/07/20    PT Start Time 1438    PT Stop Time 1525    PT Time Calculation (min) 47 min    Activity Tolerance Patient tolerated treatment well    Behavior During Therapy [facility] for tasks assessed/performed           Past Medical History:  Diagnosis Date  . Benign localized prostatic hyperplasia with lower urinary tract symptoms (LUTS)    urologist-- dr Diona Fanti  . Chronic anticoagulation    Xarelto for afib  . Chronic constipation   . Chronic cystitis   . Gross hematuria   . History of DVT of lower extremity 06/08/2010   right femoral dvt dx post op total hip 05-08-2010  . History of gastric ulcer    remote  . History of kidney stones    one stone. remote hx  . Hyperlipidemia   . Hypertension    Dx by Dr. Lance Muss around age  23   . NICM (nonischemic cardiomyopathy) ([place])    per echo 06-11-2017,  ef 45-50% with diffuse hypokinesis,  G1DD  . OSA on CPAP 2017   compliant with CPAP.  managed by Dr Elsworth Soho.  . Osteoarthritis   . Other urethral stricture, male, meatal    chronic;  pt self dilates  . Paroxysmal atrial fibrillation [facility]) cardiologist-- dr Rayann Heman   first dx 2009/   a. documented by Dr Barrett Shell on office EKG 9/12. b. maintained on Tikosyn and Xarelto. c. s/p DCCV 's.  d.  x3  EP w/ ablation atrial fib last one 12-05-2016  . Prediabetes   . Premature ventricular contraction   . PVC's (premature ventricular contractions)   . RA (rheumatoid arthritis)  [facility])    rheumatologist--- Dr Page Spiro,  treated with Humira  . RBBB (right bundle branch block)    Hx of a fib, ablation Aug 2018  . Rheumatoid arthritis ([place])   . Vitamin D deficiency   . Wears hearing aid in both ears     Past Surgical History:  Procedure Laterality Date  . ABLATION OF DYSRHYTHMIC FOCUS  08/29/2015  . ATRIAL FIBRILLATION ABLATION  12/05/2016  . ATRIAL FIBRILLATION ABLATION N/A 12/05/2016   Procedure: Atrial Fibrillation Ablation;  Surgeon: [name], MD;  Location: [place] CV LAB;  Service: Cardiovascular;  Laterality: N/A;  . ATRIAL FIBRILLATION ABLATION N/A 02/29/2020   Procedure: ATRIAL FIBRILLATION ABLATION;  Surgeon: [name], MD;  Location: [place] CV LAB;  Service: Cardiovascular;  Laterality: N/A;  . ATRIAL FLUTTER ABLATION  09/2010   by Greggory Brandy  . BUNIONECTOMY Right 1990s  . CARDIOVERSION N/A 10/28/2012   Procedure: CARDIOVERSION;  Surgeon: Burnell Blanks, MD;  Location: [place];  Service: Cardiovascular;  Laterality: N/A;  . CATARACT EXTRACTION W/ INTRAOCULAR LENS  IMPLANT, BILATERAL  2019  . CYSTOSCOPY WITH BIOPSY N/A 09/03/2018   Procedure: CYSTOSCOPY WITH FULGURATION [id number];  Surgeon: Franchot Gallo, MD;  [facility];  Service: Urology;  Laterality: N/A;  30 MINS  . ELECTROPHYSIOLOGIC STUDY N/A 08/29/2015   Procedure: Atrial Fibrillation Ablation;  Surgeon: [name], MD;  Location: [place] CV LAB;  Service: Cardiovascular;  Laterality: N/A;  . KNEE SURGERY Bilateral x3   1960s & 1970s   open  . LEFT HEART CATHETERIZATION WITH CORONARY ANGIOGRAM N/A 06/07/2011   Procedure: LEFT HEART CATHETERIZATION WITH CORONARY ANGIOGRAM;  Surgeon: Peter M Martinique, MD;  Location: [facility] CATH LAB;  Service: Cardiovascular;  Laterality: N/A;    Normal coronary arteries,  low normal LVF  . TONSILLECTOMY AND ADENOIDECTOMY  age 25  . TOTAL HIP ARTHROPLASTY Right 06/04/10    dr Noemi Chapel  _0   . TOTAL KNEE ARTHROPLASTY Bilateral right 1995;  left  1997  . TRANSURETHRAL RESECTION OF PROSTATE  06-29-2002   dr humphries  _1   . TRANSURETHRAL RESECTION OF PROSTATE N/A 07/06/2018   Procedure: TRANSURETHRAL RESECTION OF THE PROSTATE (TURP);  Surgeon: Franchot Gallo, MD;  Location[facility];  Service: Urology;  Laterality: N/A;  45 MINS    There were no vitals filed for this visit.   Subjective Assessment - 08/30/20 1512    Subjective Reports that he is doing better, less dizziness and less neck pain, still tight, reports that he feels like he wants to try the dry needling again as he felt this really helped the tightness    Currently in Pain? Yes    Pain Score 3     Pain Location Neck    Pain Orientation Right    Pain Descriptors / Indicators Tightness    Aggravating Factors  stress                             [facility] Adult PT Treatment/Exercise - 08/30/20 0001      Neck Exercises: Machines for Strengthening   UBE (Upper Arm Bike) level 2.4 x 6 minutes    Cybex Row 20# 2x10    Lat Pull 20# 2x10    Other Machines for Strengthening 10# rows and extension in the pulleys    Other Machines for Strengthening 5# knee ext, 25# leg curls      Neck Exercises: Theraband   Shoulder External Rotation 20 reps;Red      Modalities   Modalities Electrical Stimulation;Moist Heat      Manual Therapy   Manual Therapy Soft tissue mobilization;Manual Traction;Passive ROM    Manual therapy comments patient in sitting initially, then to supine    Soft tissue mobilization right cervical area and into the right upper trap    Passive ROM cervical PROM to end range with stretch    Manual Traction cervical            Trigger Point Dry Needling - 08/30/20 0001    Consent Given? Yes    Upper Trapezius Response Palpable increased muscle length    Suboccipitals Response Palpable increased muscle length                  PT Short Term Goals - 07/25/20 1045      PT SHORT TERM GOAL #1   Title Pt will be  I and compliant with initial HEP.    Status Achieved             PT Long Term Goals - 08/16/20 1006      PT LONG TERM GOAL #2   Title Pt will walk for 30 minutes with no report of  lightheadedness, as needed for daily exercise and errands.    Status Partially Met                 Plan - 08/30/20 1524    Clinical Impression Statement Patient reports that he is doing better, less pain, less difficulty with dizziness, he did thinl that the dry needling helped the neclk tightness and in turn helped the dizziness, we did this today.    PT Next Visit Plan will continue to work toward decreased pain, tightness and less dizziness    Consulted and Agree with Plan of Care Patient           Patient will benefit from skilled therapeutic intervention in order to improve the following deficits and impairments:  Pain,Postural dysfunction,Increased fascial restricitons,Decreased activity tolerance,Decreased balance,Decreased mobility,Hypomobility,Improper body mechanics,Impaired perceived functional ability,Decreased range of motion,Increased muscle spasms  Visit Diagnosis: Cervicalgia  Cramp and spasm  Other abnormalities of gait and mobility  Unsteadiness on feet  Dizziness and giddiness     Problem List Patient Active Problem List   Diagnosis Date Noted  . Secondary hypercoagulable state ([place]) 03/28/2020  . Malignant neoplasm of prostate ([place]) 09/11/2018  . Enlarged prostate with urinary obstruction 07/06/2018  . Gastrocnemius strain, right, initial encounter 12/10/2017  . Baker's cyst of knee, right 12/10/2017  . Obesity 03/24/2017  . Paroxysmal atrial fibrillation ([place]) 12/05/2016  . Near syncope 04/20/2016  . Leg hematoma, left, initial encounter 04/20/2016  . Bleeding   . Ecchymosis   . Left hamstring muscle strain   . Muscle tear   . RBBB 08/30/2015  . Chronic systolic dysfunction of left ventricle 07/11/2014  . OSA (obstructive sleep apnea) 04/07/2013  .  Multinodular goiter 01/20/2013  . Cervical spondylosis without myelopathy 01/18/2013  . Atrial fibrillation with RVR ([place]) 10/27/2012  . PAF (paroxysmal atrial fibrillation) ([place]) 05/28/2012  . Left ventricular dysfunction 12/17/2011  . Low back pain 09/11/2011  . Fatigue 05/22/2011  . PVC (premature ventricular contraction) 01/18/2011  . Persistent atrial fibrillation ([place]) 01/12/2011  . Hyperlipidemia 08/02/2010  . Hypertension 08/02/2010  . Osteoarthritis 08/02/2010  . BPH (benign prostatic hyperplasia) 08/02/2010  . GERD (gastroesophageal reflux disease) 08/02/2010  . h/o Atrial flutter [facility]) s/p ablation 2012     Calie Buttrey W., PT 08/30/2020, 3:26 PM  [place]. [place], [place], [address] Phone: [phone]   Fax:  [phone]  Name: THORNTON DOHRMANN MRN: [id number] Date of Birth: 04/02/1934

## 2020-08-31 DIAGNOSIS — H353132 Nonexudative age-related macular degeneration, bilateral, intermediate dry stage: Secondary | ICD-10-CM | POA: Diagnosis not present

## 2020-09-01 ENCOUNTER — Encounter (INDEPENDENT_AMBULATORY_CARE_PROVIDER_SITE_OTHER): Payer: Medicare PPO | Admitting: Ophthalmology

## 2020-09-01 ENCOUNTER — Ambulatory Visit: Payer: Medicare PPO | Admitting: Physical Therapy

## 2020-09-01 ENCOUNTER — Other Ambulatory Visit: Payer: Self-pay

## 2020-09-01 DIAGNOSIS — H43813 Vitreous degeneration, bilateral: Secondary | ICD-10-CM | POA: Diagnosis not present

## 2020-09-01 DIAGNOSIS — H35033 Hypertensive retinopathy, bilateral: Secondary | ICD-10-CM | POA: Diagnosis not present

## 2020-09-01 DIAGNOSIS — I10 Essential (primary) hypertension: Secondary | ICD-10-CM

## 2020-09-01 DIAGNOSIS — H353132 Nonexudative age-related macular degeneration, bilateral, intermediate dry stage: Secondary | ICD-10-CM

## 2020-09-04 ENCOUNTER — Encounter: Payer: Self-pay | Admitting: Physical Therapy

## 2020-09-04 ENCOUNTER — Ambulatory Visit: Payer: Medicare PPO | Attending: Family Medicine | Admitting: Physical Therapy

## 2020-09-04 ENCOUNTER — Other Ambulatory Visit: Payer: Self-pay

## 2020-09-04 DIAGNOSIS — R262 Difficulty in walking, not elsewhere classified: Secondary | ICD-10-CM

## 2020-09-04 DIAGNOSIS — R2681 Unsteadiness on feet: Secondary | ICD-10-CM

## 2020-09-04 DIAGNOSIS — R252 Cramp and spasm: Secondary | ICD-10-CM | POA: Diagnosis not present

## 2020-09-04 DIAGNOSIS — R2689 Other abnormalities of gait and mobility: Secondary | ICD-10-CM

## 2020-09-04 DIAGNOSIS — M542 Cervicalgia: Secondary | ICD-10-CM | POA: Diagnosis not present

## 2020-09-04 DIAGNOSIS — R42 Dizziness and giddiness: Secondary | ICD-10-CM | POA: Diagnosis not present

## 2020-09-04 NOTE — Therapy (Signed)
[facility]. [place], [place], [address] Phone: [phone]   Fax:  [phone]  Physical Therapy Treatment  Patient Details  Name: Connor [name] MRN: [id number] Date of Birth: 07/06/1933 Referring Provider (PT): Lamar Blinks, MD   Encounter Date: 09/04/2020   PT End of Session - 09/04/20 0957    Visit Number 9    Number of Visits 12    Date for PT Re-Evaluation 09/07/20    Authorization Type [employer] - paper auth requested 07/13/20 - 09/07/20    PT Start Time 6962    PT Stop Time 1010    PT Time Calculation (min) 48 min    Activity Tolerance Patient tolerated treatment well    Behavior During Therapy [facility] for tasks assessed/performed           Past Medical History:  Diagnosis Date  . Benign localized prostatic hyperplasia with lower urinary tract symptoms (LUTS)    urologist-- dr Diona Fanti  . Chronic anticoagulation    Xarelto for afib  . Chronic constipation   . Chronic cystitis   . Gross hematuria   . History of DVT of lower extremity 06/08/2010   right femoral dvt dx post op total hip 05-08-2010  . History of gastric ulcer    remote  . History of kidney stones    one stone. remote hx  . Hyperlipidemia   . Hypertension    Dx by Dr. Lance Muss around age  59   . NICM (nonischemic cardiomyopathy) ([place])    per echo 06-11-2017,  ef 45-50% with diffuse hypokinesis,  G1DD  . OSA on CPAP 2017   compliant with CPAP.  managed by Dr Elsworth Soho.  . Osteoarthritis   . Other urethral stricture, male, meatal    chronic;  pt self dilates  . Paroxysmal atrial fibrillation [facility]) cardiologist-- dr Rayann Heman   first dx 2009/   a. documented by Dr Barrett Shell on office EKG 9/12. b. maintained on Tikosyn and Xarelto. c. s/p DCCV 's.  d.  x3  EP w/ ablation atrial fib last one 12-05-2016  . Prediabetes   . Premature ventricular contraction   . PVC's (premature ventricular contractions)   . RA (rheumatoid arthritis)  [facility])    rheumatologist--- Dr Page Spiro,  treated with Humira  . RBBB (right bundle branch block)    Hx of a fib, ablation Aug 2018  . Rheumatoid arthritis ([place])   . Vitamin D deficiency   . Wears hearing aid in both ears     Past Surgical History:  Procedure Laterality Date  . ABLATION OF DYSRHYTHMIC FOCUS  08/29/2015  . ATRIAL FIBRILLATION ABLATION  12/05/2016  . ATRIAL FIBRILLATION ABLATION N/A 12/05/2016   Procedure: Atrial Fibrillation Ablation;  Surgeon: [name], MD;  Location: [place] CV LAB;  Service: Cardiovascular;  Laterality: N/A;  . ATRIAL FIBRILLATION ABLATION N/A 02/29/2020   Procedure: ATRIAL FIBRILLATION ABLATION;  Surgeon: [name], MD;  Location: [place] CV LAB;  Service: Cardiovascular;  Laterality: N/A;  . ATRIAL FLUTTER ABLATION  09/2010   by Greggory Brandy  . BUNIONECTOMY Right 1990s  . CARDIOVERSION N/A 10/28/2012   Procedure: CARDIOVERSION;  Surgeon: Burnell Blanks, MD;  Location: [place];  Service: Cardiovascular;  Laterality: N/A;  . CATARACT EXTRACTION W/ INTRAOCULAR LENS  IMPLANT, BILATERAL  2019  . CYSTOSCOPY WITH BIOPSY N/A 09/03/2018   Procedure: CYSTOSCOPY WITH FULGURATION [id number];  Surgeon: Franchot Gallo, MD;  [facility];  Service: Urology;  Laterality: N/A;  30 MINS  . ELECTROPHYSIOLOGIC STUDY N/A 08/29/2015   Procedure: Atrial Fibrillation Ablation;  Surgeon: [name], MD;  Location: [place] CV LAB;  Service: Cardiovascular;  Laterality: N/A;  . KNEE SURGERY Bilateral x3   1960s & 1970s   open  . LEFT HEART CATHETERIZATION WITH CORONARY ANGIOGRAM N/A 06/07/2011   Procedure: LEFT HEART CATHETERIZATION WITH CORONARY ANGIOGRAM;  Surgeon: Peter M Martinique, MD;  Location: [facility] CATH LAB;  Service: Cardiovascular;  Laterality: N/A;    Normal coronary arteries,  low normal LVF  . TONSILLECTOMY AND ADENOIDECTOMY  age 72  . TOTAL HIP ARTHROPLASTY Right 06/04/10    dr Noemi Chapel  '@[facility]'   . TOTAL KNEE ARTHROPLASTY Bilateral right 1995;  left  1997  . TRANSURETHRAL RESECTION OF PROSTATE  06-29-2002   dr humphries  '@[facility]'   . TRANSURETHRAL RESECTION OF PROSTATE N/A 07/06/2018   Procedure: TRANSURETHRAL RESECTION OF THE PROSTATE (TURP);  Surgeon: Franchot Gallo, MD;  Location: [facility];  Service: Urology;  Laterality: N/A;  45 MINS    There were no vitals filed for this visit.   Subjective Assessment - 09/04/20 0955    Subjective Patient reports that he is doing better, less dizziness and less pain, however he reports increased stress last week has caused an increase over the past week.    Currently in Pain? Yes    Pain Score 4     Pain Location Neck    Pain Orientation Right    Aggravating Factors  stress                             [facility] Adult PT Treatment/Exercise - 09/04/20 0001      Neck Exercises: Machines for Strengthening   UBE (Upper Arm Bike) level 2.4 x 6 minutes    Cybex Row 20# 2x10    Lat Pull 20# 2x10    Other Machines for Strengthening 10# rows and extension in the pulleys    Other Machines for Strengthening 5# knee ext, 25# leg curls      Traction   Type of Traction Cervical    Max (lbs) 16    Hold Time static    Time 12      Manual Therapy   Manual Therapy Soft tissue mobilization    Soft tissue mobilization right cervical area and into the right upper trap                    PT Short Term Goals - 07/25/20 1045      PT SHORT TERM GOAL #1   Title Pt will be I and compliant with initial HEP.    Status Achieved             PT Long Term Goals - 09/04/20 1000      PT LONG TERM GOAL #1   Title Pt will be independent with long term HEP for improved posture.    Status Partially Met      PT LONG TERM GOAL #2   Title Pt will walk for 30 minutes with no report of lightheadedness, as needed for daily exercise and errands.    Status Partially Met      PT LONG TERM GOAL #3   Title Pt will demo pain free cervical AROM in all planes, nearly symmetrical  from side to side .    Status Achieved      PT LONG TERM GOAL #  4   Title Pt will demo FOTO score of at least 64%, demonstrating significant change in subjective ability.    Status Partially Met                 Plan - 09/04/20 0958    Clinical Impression Statement Patient has been doing well, today is visit 9 of 12, but the authorization runs out on Thursday, he reports new stressor with wife having surgery and then some cancerous cells found, he has increased neck tension.  He was responding very well with less pain and dizziness.  He has a lot of spasm in the right cervical and upper trap area, his cervical ROM is improved as well    PT Frequency 2x / week    PT Duration 6 weeks    PT Next Visit Plan will continue to work toward decreased pain, tightness and less dizziness    Consulted and Agree with Plan of Care Patient           Patient will benefit from skilled therapeutic intervention in order to improve the following deficits and impairments:  Pain,Postural dysfunction,Increased fascial restricitons,Decreased activity tolerance,Decreased balance,Decreased mobility,Hypomobility,Improper body mechanics,Impaired perceived functional ability,Decreased range of motion,Increased muscle spasms  Visit Diagnosis: Cervicalgia  Cramp and spasm  Other abnormalities of gait and mobility  Unsteadiness on feet  Dizziness and giddiness  Difficulty in walking, not elsewhere classified     Problem List Patient Active Problem List   Diagnosis Date Noted  . Secondary hypercoagulable state ([place]) 03/28/2020  . Malignant neoplasm of prostate ([place]) 09/11/2018  . Enlarged prostate with urinary obstruction 07/06/2018  . Gastrocnemius strain, right, initial encounter 12/10/2017  . Baker's cyst of knee, right 12/10/2017  . Obesity 03/24/2017  . Paroxysmal atrial fibrillation ([place]) 12/05/2016  . Near syncope 04/20/2016  . Leg hematoma, left, initial encounter 04/20/2016  . Bleeding    . Ecchymosis   . Left hamstring muscle strain   . Muscle tear   . RBBB 08/30/2015  . Chronic systolic dysfunction of left ventricle 07/11/2014  . OSA (obstructive sleep apnea) 04/07/2013  . Multinodular goiter 01/20/2013  . Cervical spondylosis without myelopathy 01/18/2013  . Atrial fibrillation with RVR ([place]) 10/27/2012  . PAF (paroxysmal atrial fibrillation) ([place]) 05/28/2012  . Left ventricular dysfunction 12/17/2011  . Low back pain 09/11/2011  . Fatigue 05/22/2011  . PVC (premature ventricular contraction) 01/18/2011  . Persistent atrial fibrillation ([place]) 01/12/2011  . Hyperlipidemia 08/02/2010  . Hypertension 08/02/2010  . Osteoarthritis 08/02/2010  . BPH (benign prostatic hyperplasia) 08/02/2010  . GERD (gastroesophageal reflux disease) 08/02/2010  . h/o Atrial flutter [facility]) s/p ablation 2012     Krisann Mckenna W., PT 09/04/2020, 10:04 AM  [facility] [place]. [place], [place], [address] Phone: [phone]   Fax:  [phone]  Name: Connor [name] MRN: [id number] Date of Birth: 1933/06/17

## 2020-09-05 ENCOUNTER — Other Ambulatory Visit (HOSPITAL_COMMUNITY): Payer: Self-pay | Admitting: Internal Medicine

## 2020-09-05 NOTE — Patient Instructions (Addendum)
Great to see you again today, take care! Your BP looks fine Let me know what I can do to help with Margaret's situation  We will get some labs for you today- we will look for any explanation for your fatigue.  Work on gradually increasing your exercise to build up your strength and stamina

## 2020-09-05 NOTE — Progress Notes (Addendum)
[facility] [place] at [facility] [address] [place], [place] [address] [phone]  Date:  09/07/2020   Name:  Connor [name]   DOB:  [birth year]   MRN:  [id number]  PCP:  Darreld Mclean, MD    Chief Complaint: Follow-up   History of Present Illness:  Connor [name] is a 87 y.o. very pleasant male patient who presents with the following:  Short-term follow-up today.  Patient last seen by myself in January [place] with history of atrial fibrillation, left ventricle dysfunction, prostate cancer, seronegative rheumatoid arthritis and osteoarthritis  He is taking his xarelto for a fib,which is intermittent Earlier this year he was having some problems with vertigo, he had an MRI brain which was normal, referral to physical therapy which is helping  I increased his dose of lisinopril in January-we then increased again to 30 mg in February as blood pressure was still high, and then to 40 mg in April He is now taking lisinopril 40 and amlodipine 5 mg  His BP is under ok control now- he saw his cardiologist yesterday Dr Rayann Heman, he was comfortable with current blood pressure range  COVID-19 fourth dose- he plans to do ASAP Shingrix is done Colonoscopy 2012- can ask patient about follow-up.  We did cologuard in 2021- negative.  He is status up-to-date  He is followed by urology for prostate cancer, most recent visit in March of this year.  PSA undetectable  No CP, however he notes that he tends to feel tired a lot of the time His vertigo is better He feels like he sleeps well at night - he uses cpap which he likes ok He notes that he has been less physically active during the pandemic, recently he and his wife are trying to get more exercise and do more walking.  He thinks he likely needs to rebuild his stamina  He also notes his [name] recently had a cholecystectomy, she was noted to have gallbladder cancer at that time.   She is going to have a partial liver resection soon per [facility] general surgery  Patient Active Problem List   Diagnosis Date Noted  . Secondary hypercoagulable state ([place]) 03/28/2020  . Malignant neoplasm of prostate ([place]) 09/11/2018  . Enlarged prostate with urinary obstruction 07/06/2018  . Gastrocnemius strain, right, initial encounter 12/10/2017  . Baker's cyst of knee, right 12/10/2017  . Obesity 03/24/2017  . Paroxysmal atrial fibrillation ([place]) 12/05/2016  . Near syncope 04/20/2016  . Leg hematoma, left, initial encounter 04/20/2016  . Bleeding   . Ecchymosis   . Left hamstring muscle strain   . Muscle tear   . RBBB 08/30/2015  . Chronic systolic dysfunction of left ventricle 07/11/2014  . OSA (obstructive sleep apnea) 04/07/2013  . Multinodular goiter 01/20/2013  . Cervical spondylosis without myelopathy 01/18/2013  . Atrial fibrillation with RVR ([place]) 10/27/2012  . Left ventricular dysfunction 12/17/2011  . Low back pain 09/11/2011  . Fatigue 05/22/2011  . PVC (premature ventricular contraction) 01/18/2011  . Persistent atrial fibrillation ([place]) 01/12/2011  . Hyperlipidemia 08/02/2010  . Hypertension 08/02/2010  . Osteoarthritis 08/02/2010  . BPH (benign prostatic hyperplasia) 08/02/2010  . GERD (gastroesophageal reflux disease) 08/02/2010  . h/o Atrial flutter ([place]) s/p ablation 2012     Past Medical History:  Diagnosis Date  . Benign localized prostatic hyperplasia with lower urinary tract symptoms (LUTS)    urologist-- dr Diona Fanti  . Chronic  anticoagulation    Xarelto for afib  . Chronic constipation   . Chronic cystitis   . Gross hematuria   . History of DVT of lower extremity 06/08/2010   right femoral dvt dx post op total hip 05-08-2010  . History of gastric ulcer    remote  . History of kidney stones    one stone. remote hx  . Hyperlipidemia   . Hypertension    Dx by Dr. Lance Muss around age  60   . NICM (nonischemic cardiomyopathy) ([place])     per echo 06-11-2017,  ef 45-50% with diffuse hypokinesis,  G1DD  . OSA on CPAP 2017   compliant with CPAP.  managed by Dr Elsworth Soho.  . Osteoarthritis   . Other urethral stricture, male, meatal    chronic;  pt self dilates  . Paroxysmal atrial fibrillation [facility]) cardiologist-- dr Rayann Heman   first dx 2009/   a. documented by Dr Barrett Shell on office EKG 9/12. b. maintained on Tikosyn and Xarelto. c. s/p DCCV 's.  d.  x3  EP w/ ablation atrial fib last one 12-05-2016  . Prediabetes   . Premature ventricular contraction   . PVC's (premature ventricular contractions)   . RA (rheumatoid arthritis) [facility])    rheumatologist--- Dr Page Spiro,  treated with Humira  . RBBB (right bundle branch block)    Hx of a fib, ablation Aug 2018  . Rheumatoid arthritis ([place])   . Vitamin D deficiency   . Wears hearing aid in both ears     Past Surgical History:  Procedure Laterality Date  . ABLATION OF DYSRHYTHMIC FOCUS  08/29/2015  . ATRIAL FIBRILLATION ABLATION  12/05/2016  . ATRIAL FIBRILLATION ABLATION N/A 12/05/2016   Procedure: Atrial Fibrillation Ablation;  Surgeon: [name], MD;  Location: [place] CV LAB;  Service: Cardiovascular;  Laterality: N/A;  . ATRIAL FIBRILLATION ABLATION N/A 02/29/2020   Procedure: ATRIAL FIBRILLATION ABLATION;  Surgeon: [name], MD;  Location: [place] CV LAB;  Service: Cardiovascular;  Laterality: N/A;  . ATRIAL FLUTTER ABLATION  09/2010   by Greggory Brandy  . BUNIONECTOMY Right 1990s  . CARDIOVERSION N/A 10/28/2012   Procedure: CARDIOVERSION;  Surgeon: [place] Blanks, MD;  Location: [place];  Service: Cardiovascular;  Laterality: N/A;  . CATARACT EXTRACTION W/ INTRAOCULAR LENS  IMPLANT, BILATERAL  2019  . CYSTOSCOPY WITH BIOPSY N/A 09/03/2018   Procedure: CYSTOSCOPY WITH FULGURATION [id number];  Surgeon: Franchot Gallo, MD;  Location: [facility];  Service: Urology;  Laterality: N/A;  30 MINS  . ELECTROPHYSIOLOGIC STUDY N/A 08/29/2015   Procedure: Atrial Fibrillation  Ablation;  Surgeon: [name], MD;  Location: [place] CV LAB;  Service: Cardiovascular;  Laterality: N/A;  . KNEE SURGERY Bilateral x3   1960s & 1970s   open  . LEFT HEART CATHETERIZATION WITH CORONARY ANGIOGRAM N/A 06/07/2011   Procedure: LEFT HEART CATHETERIZATION WITH CORONARY ANGIOGRAM;  Surgeon: Peter M Martinique, MD;  Location: [facility] CATH LAB;  Service: Cardiovascular;  Laterality: N/A;    Normal coronary arteries,  low normal LVF  . TONSILLECTOMY AND ADENOIDECTOMY  age 40  . TOTAL HIP ARTHROPLASTY Right 06/04/10    dr Noemi Chapel  @[facility]   . TOTAL KNEE ARTHROPLASTY Bilateral right 1995;  left 1997  . TRANSURETHRAL RESECTION OF PROSTATE  06-29-2002   dr humphries  @[facility]   . TRANSURETHRAL RESECTION OF PROSTATE N/A 07/06/2018   Procedure: TRANSURETHRAL RESECTION OF THE PROSTATE (TURP);  Surgeon: Franchot Gallo, MD;  Location: [facility];  Service: Urology;  Laterality: N/A;  86 MINS    Social History   Tobacco Use  . Smoking status: Former Smoker    Packs/day: 1.00    Years: 30.00    Pack years: 30.00    Types: Cigarettes    Quit date: 05/06/1978    Years since quitting: 42.3  . Smokeless tobacco: Never Used  Vaping Use  . Vaping Use: Never used  Substance Use Topics  . Alcohol use: Yes    Comment: occasionally wine and beer  . Drug use: Never    Family History  Problem Relation Age of Onset  . Heart attack Father   . Lung disease Mother   . Arrhythmia Sister   . Atrial fibrillation Brother   . Diabetes Neg Hx     Allergies  Allergen Reactions  . Penicillins Itching, Swelling and Other (See Comments)    Did it involve swelling of the face/tongue/throat, SOB, or low BP? No Did it involve sudden or severe rash/hives, skin peeling, or any reaction on the inside of your mouth or nose? No Did you need to seek medical attention at a hospital or doctor's office? No When did it last happen?30 + years If all above answers are "NO", may proceed with cephalosporin  use.      Medication list has been reviewed and updated.  Current Outpatient Medications on File Prior to Visit  Medication Sig Dispense Refill  . acetaminophen (TYLENOL) 500 MG tablet Take 1,000-1,500 mg by mouth 2 (two) times daily as needed for moderate pain.     Marland Kitchen amLODipine (NORVASC) 5 MG tablet TAKE 1 TABLET (5 MG TOTAL) BY MOUTH DAILY. 30 tablet 0  . atorvastatin (LIPITOR) 10 MG tablet Take 1 tablet (10 mg total) by mouth daily. 90 tablet 3  . Carboxymethylcellulose Sodium (THERATEARS OP) Apply 1 drop to eye daily as needed (dry eyes).    . Cholecalciferol (VITAMIN D3) 5000 UNITS TABS Take 5,000 Units by mouth See admin instructions. Every 4 days    . clindamycin (CLEOCIN T) 1 % external solution Apply 1 application topically daily as needed (for bumps in scalp). Apply to scalp  2  . Cranberry 1000 MG CAPS Take 2,000-3,000 mg by mouth See admin instructions. Take 20000 mg in the morning and 3000 mg in the evening    . Docusate Calcium (STOOL SOFTENER PO) Take 2 tablets by mouth at bedtime.     . finasteride (PROSCAR) 5 MG tablet Take 5 mg by mouth every morning.   3  . HUMIRA PEN 40 MG/0.4ML PNKT Inject 40 mg as directed every 7 (seven) days. Every Tuesday  6  . lisinopril (ZESTRIL) 40 MG tablet Take 1 tablet (40 mg total) by mouth daily. 90 tablet 3  . metoprolol succinate (TOPROL-XL) 50 MG 24 hr tablet TAKE 1/2 TABLET IN THE MORNING AND TAKE 1 TABLET IN THE EVENING 135 tablet 3  . Multiple Vitamins-Minerals (PRESERVISION AREDS 2) CAPS Take 1 capsule by mouth 2 (two) times a day.    . Omega-3 Fatty Acids (OMEGA 3 PO) Take 3 capsules by mouth every evening.    Marland Kitchen omeprazole (PRILOSEC) 20 MG capsule Take 20 mg by mouth daily.    . solifenacin (VESICARE) 10 MG tablet Take 10 mg by mouth at bedtime.     . tamsulosin (FLOMAX) 0.4 MG CAPS capsule Take 1 capsule (0.4 mg total) by mouth daily after supper. 30 capsule 5  . vitamin B-12 (CYANOCOBALAMIN) 500 MCG tablet Take 500 mcg by mouth  every evening.     Alveda Reasons 20 MG TABS tablet TAKE 1 TABLET BY MOUTH EVERY DAY IN THE EVENING 90 tablet 1   No current facility-administered medications on file prior to visit.    Review of Systems:  As per HPI- otherwise negative.   Physical Examination: Vitals:   09/07/20 0957  BP: 135/76  Pulse: 68  Temp: (!) 97.2 F (36.2 C)  SpO2: 96%   Vitals:   09/07/20 0957  Weight: 223 lb 12.8 oz (101.5 kg)  Height: 5\' 8"  (1.727 m)   Body mass index is 34.03 kg/m. Ideal Body Weight: Weight in (lb) to have BMI = 25: 164.1  GEN: no acute distress.  Looks well, his normal self.  Overweight, hard of hearing HEENT: Atraumatic, Normocephalic.  Ears and Nose: No external deformity. CV: RRR, No M/G/R. No JVD. No thrill. No extra heart sounds. PULM: CTA B, no wheezes, crackles, rhonchi. No retractions. No resp. distress. No accessory muscle use. ABD: S, NT, ND, +BS. No rebound. No HSM. EXTR: No c/c/e PSYCH: Normally interactive. Conversant.    Assessment and Plan: Essential hypertension - Plan: Basic metabolic panel, CANCELED: CBC  Fatigue, unspecified type - Plan: CBC, TSH, Vitamin B12  Bilateral hearing loss, unspecified hearing loss type  Class 1 obesity due to excess calories without serious comorbidity with body mass index (BMI) of 34.0 to 34.9 in adult  Stress due to illness of family member  Following up today.  Blood pressure is under good control on current regimen, continue amlodipine and lisinopril 40 Discussed fatigue, this may be related to deconditioning.  I would check a blood count, thyroid and B12 He is working on weight loss and trying to get more exercise Will plan further follow- up pending labs. We discussed his wife's recent cancer diagnosis, she is also my patient.  I offered support and invited them to contact me if I can help in any way This visit occurred during the SARS-CoV-2 public health emergency.  Safety protocols were in place, including  screening questions prior to the visit, additional usage of staff PPE, and extensive cleaning of exam room while observing appropriate contact time as indicated for disinfecting solutions.    Signed Lamar Blinks, MD  Received his labs as below, message to patient  Results for orders placed or performed in visit on 09/07/20  CBC  Result Value Ref Range   WBC 4.3 4.0 - 10.5 K/uL   RBC 3.86 (L) 4.22 - 5.81 Mil/uL   Platelets 126.0 (L) 150.0 - 400.0 K/uL   Hemoglobin 13.3 13.0 - 17.0 g/dL   HCT 40.0 39.0 - 52.0 %   MCV 103.7 (H) 78.0 - 100.0 fl   MCHC 33.3 30.0 - 36.0 g/dL   RDW 14.6 11.5 - 15.5 %  TSH  Result Value Ref Range   TSH 1.37 0.35 - 4.50 uIU/mL  Vitamin B12  Result Value Ref Range   Vitamin B-12 977 (H) 211 - 911 pg/mL  Basic metabolic panel  Result Value Ref Range   Sodium 143 135 - 145 mEq/L   Potassium 3.8 3.5 - 5.1 mEq/L   Chloride 106 96 - 112 mEq/L   CO2 29 19 - 32 mEq/L   Glucose, Bld 118 (H) 70 - 99 mg/dL   BUN 16 6 - 23 mg/dL   Creatinine, Ser 0.65 0.40 - 1.50 mg/dL   GFR 86.52 >60.00 mL/min   Calcium 8.7 8.4 - 10.5 mg/dL

## 2020-09-06 ENCOUNTER — Ambulatory Visit: Payer: Medicare PPO | Admitting: Internal Medicine

## 2020-09-06 ENCOUNTER — Encounter: Payer: Self-pay | Admitting: Internal Medicine

## 2020-09-06 ENCOUNTER — Other Ambulatory Visit: Payer: Self-pay

## 2020-09-06 VITALS — BP 140/76 | HR 70 | Ht 68.0 in | Wt 224.0 lb

## 2020-09-06 DIAGNOSIS — I10 Essential (primary) hypertension: Secondary | ICD-10-CM | POA: Diagnosis not present

## 2020-09-06 DIAGNOSIS — I4819 Other persistent atrial fibrillation: Secondary | ICD-10-CM | POA: Diagnosis not present

## 2020-09-06 DIAGNOSIS — G4733 Obstructive sleep apnea (adult) (pediatric): Secondary | ICD-10-CM | POA: Diagnosis not present

## 2020-09-06 NOTE — Progress Notes (Signed)
PCP: Darreld Mclean, MD   Primary EP: Dr Nena Polio III is a 87 y.o. male who presents today for routine electrophysiology followup.  Since last being seen in our clinic, the patient reports doing very well.  Today, he denies symptoms of palpitations, chest pain, shortness of breath,  lower extremity edema, dizziness, presyncope, or syncope.  The patient is otherwise without complaint today.   Past Medical History:  Diagnosis Date  . Benign localized prostatic hyperplasia with lower urinary tract symptoms (LUTS)    urologist-- dr Diona Fanti  . Chronic anticoagulation    Xarelto for afib  . Chronic constipation   . Chronic cystitis   . Gross hematuria   . History of DVT of lower extremity 06/08/2010   right femoral dvt dx post op total hip 05-08-2010  . History of gastric ulcer    remote  . History of kidney stones    one stone. remote hx  . Hyperlipidemia   . Hypertension    Dx by Dr. Lance Muss around age  69   . NICM (nonischemic cardiomyopathy) ([place])    per echo 06-11-2017,  ef 45-50% with diffuse hypokinesis,  G1DD  . OSA on CPAP 2017   compliant with CPAP.  managed by Dr Elsworth Soho.  . Osteoarthritis   . Other urethral stricture, male, meatal    chronic;  pt self dilates  . Paroxysmal atrial fibrillation [facility]) cardiologist-- dr Rayann Heman   first dx 2009/   a. documented by Dr Barrett Shell on office EKG 9/12. b. maintained on Tikosyn and Xarelto. c. s/p DCCV 's.  d.  x3  EP w/ ablation atrial fib last one 12-05-2016  . Prediabetes   . Premature ventricular contraction   . PVC's (premature ventricular contractions)   . RA (rheumatoid arthritis) [facility])    rheumatologist--- Dr Page Spiro,  treated with Humira  . RBBB (right bundle branch block)    Hx of a fib, ablation Aug 2018  . Rheumatoid arthritis ([place])   . Vitamin D deficiency   . Wears hearing aid in both ears    Past Surgical History:  Procedure Laterality Date  . ABLATION OF DYSRHYTHMIC FOCUS  08/29/2015  .  ATRIAL FIBRILLATION ABLATION  12/05/2016  . ATRIAL FIBRILLATION ABLATION N/A 12/05/2016   Procedure: Atrial Fibrillation Ablation;  Surgeon: [name], MD;  Location: [place] CV LAB;  Service: Cardiovascular;  Laterality: N/A;  . ATRIAL FIBRILLATION ABLATION N/A 02/29/2020   Procedure: ATRIAL FIBRILLATION ABLATION;  Surgeon: [name], MD;  Location: [place] CV LAB;  Service: Cardiovascular;  Laterality: N/A;  . ATRIAL FLUTTER ABLATION  09/2010   by Greggory Brandy  . BUNIONECTOMY Right 1990s  . CARDIOVERSION N/A 10/28/2012   Procedure: CARDIOVERSION;  Surgeon: Burnell Blanks, MD;  Location: [place];  Service: Cardiovascular;  Laterality: N/A;  . CATARACT EXTRACTION W/ INTRAOCULAR LENS  IMPLANT, BILATERAL  2019  . CYSTOSCOPY WITH BIOPSY N/A 09/03/2018   Procedure: CYSTOSCOPY WITH FULGURATION [id number];  Surgeon: Franchot Gallo, MD;  Location: [facility];  Service: Urology;  Laterality: N/A;  30 MINS  . ELECTROPHYSIOLOGIC STUDY N/A 08/29/2015   Procedure: Atrial Fibrillation Ablation;  Surgeon: [name], MD;  Location: [place] CV LAB;  Service: Cardiovascular;  Laterality: N/A;  . KNEE SURGERY Bilateral x3   1960s & 1970s   open  . LEFT HEART CATHETERIZATION WITH CORONARY ANGIOGRAM N/A 06/07/2011   Procedure: LEFT HEART CATHETERIZATION WITH CORONARY ANGIOGRAM;  Surgeon: Peter M Martinique, MD;  Location:  [place] CATH LAB;  Service: Cardiovascular;  Laterality: N/A;    Normal coronary arteries,  low normal LVF  . TONSILLECTOMY AND ADENOIDECTOMY  age 63  . TOTAL HIP ARTHROPLASTY Right 06/04/10    dr Noemi Chapel  @[facility]   . TOTAL KNEE ARTHROPLASTY Bilateral right 1995;  left 1997  . TRANSURETHRAL RESECTION OF PROSTATE  06-29-2002   dr humphries  @[facility]   . TRANSURETHRAL RESECTION OF PROSTATE N/A 07/06/2018   Procedure: TRANSURETHRAL RESECTION OF THE PROSTATE (TURP);  Surgeon: Franchot Gallo, MD;  Location: [facility];  Service: Urology;  Laterality: N/A;  56 MINS    ROS- all systems are  reviewed and negatives except as per HPI above  Current Outpatient Medications  Medication Sig Dispense Refill  . acetaminophen (TYLENOL) 500 MG tablet Take 1,000-1,500 mg by mouth 2 (two) times daily as needed for moderate pain.     Marland Kitchen amLODipine (NORVASC) 5 MG tablet TAKE 1 TABLET (5 MG TOTAL) BY MOUTH DAILY. 30 tablet 0  . atorvastatin (LIPITOR) 10 MG tablet Take 1 tablet (10 mg total) by mouth daily. 90 tablet 3  . Carboxymethylcellulose Sodium (THERATEARS OP) Apply 1 drop to eye daily as needed (dry eyes).    . Cholecalciferol (VITAMIN D3) 5000 UNITS TABS Take 5,000 Units by mouth See admin instructions. Every 4 days    . clindamycin (CLEOCIN T) 1 % external solution Apply 1 application topically daily as needed (for bumps in scalp). Apply to scalp  2  . Cranberry 1000 MG CAPS Take 2,000-3,000 mg by mouth See admin instructions. Take 20000 mg in the morning and 3000 mg in the evening    . Docusate Calcium (STOOL SOFTENER PO) Take 2 tablets by mouth at bedtime.     . finasteride (PROSCAR) 5 MG tablet Take 5 mg by mouth every morning.   3  . HUMIRA PEN 40 MG/0.4ML PNKT Inject 40 mg as directed every 7 (seven) days. Every Tuesday  6  . lisinopril (ZESTRIL) 40 MG tablet Take 1 tablet (40 mg total) by mouth daily. 90 tablet 3  . metoprolol succinate (TOPROL-XL) 50 MG 24 hr tablet TAKE 1/2 TABLET IN THE MORNING AND TAKE 1 TABLET IN THE EVENING 135 tablet 3  . metoprolol tartrate (LOPRESSOR) 25 MG tablet TAKE 1 TABLET BY MOUTH EVERY 6 HOURS AS NEEDED FOR FAST HEART RATES 90 tablet 2  . Multiple Vitamins-Minerals (PRESERVISION AREDS 2) CAPS Take 1 capsule by mouth 2 (two) times a day.    . Omega-3 Fatty Acids (OMEGA 3 PO) Take 3 capsules by mouth every evening.    Marland Kitchen omeprazole (PRILOSEC) 20 MG capsule Take 20 mg by mouth daily.    . solifenacin (VESICARE) 10 MG tablet Take 10 mg by mouth at bedtime.     . tamsulosin (FLOMAX) 0.4 MG CAPS capsule Take 1 capsule (0.4 mg total) by mouth daily after  supper. 30 capsule 5  . vitamin B-12 (CYANOCOBALAMIN) 500 MCG tablet Take 500 mcg by mouth every evening.     Alveda Reasons 20 MG TABS tablet TAKE 1 TABLET BY MOUTH EVERY DAY IN THE EVENING 90 tablet 1   No current facility-administered medications for this visit.    Physical Exam: Vitals:   09/06/20 1111  BP: 140/76  Pulse: 70  SpO2: 94%  Weight: 224 lb (101.6 kg)  Height: 5\' 8"  (1.727 m)    GEN- The patient is well appearing, alert and oriented x 3 today.   Head- normocephalic, atraumatic Eyes-  Sclera clear,  conjunctiva pink Ears- hearing intact Oropharynx- clear Lungs- Clear to ausculation bilaterally, normal work of breathing Heart- Regular rate and rhythm, no murmurs, rubs or gallops, PMI not laterally displaced GI- soft, NT, ND, + BS Extremities- no clubbing, cyanosis, or edema  Wt Readings from Last 3 Encounters:  09/06/20 224 lb (101.6 kg)  06/26/20 224 lb 12.8 oz (102 kg)  06/08/20 227 lb (103 kg)    EKG tracing ordered today is personally reviewed and shows sinus, RBBB  Assessment and Plan:  1. Persistent atrial fibrillation Doing well s/p ablation chads2vasc score is 4.  He is on xarelto  2. HTN Stable No change required today  3. OSA Compliance with CPAP advised   Risks, benefits and potential toxicities for medications prescribed and/or refilled reviewed with patient today.   AF clinic in 6 months I will see in 12 months  [name] MD, [facility] 09/06/2020 11:29 AM

## 2020-09-06 NOTE — Patient Instructions (Addendum)
Medication Instructions:  Your physician recommends that you continue on your current medications as directed. Please refer to the Current Medication list given to you today.  Labwork: None ordered.  Testing/Procedures: None ordered.  Follow-Up: Your physician wants you to follow-up in: 6 months with the Afib Clinic. They will contact you to schedule.    Any Other Special Instructions Will Be Listed Below (If Applicable).  If you need a refill on your cardiac medications before your next appointment, please call your pharmacy.         

## 2020-09-07 ENCOUNTER — Encounter: Payer: Self-pay | Admitting: Family Medicine

## 2020-09-07 ENCOUNTER — Ambulatory Visit: Payer: Medicare PPO | Admitting: Family Medicine

## 2020-09-07 VITALS — BP 135/76 | HR 68 | Temp 97.2°F | Ht 68.0 in | Wt 223.8 lb

## 2020-09-07 DIAGNOSIS — R5383 Other fatigue: Secondary | ICD-10-CM | POA: Diagnosis not present

## 2020-09-07 DIAGNOSIS — E6609 Other obesity due to excess calories: Secondary | ICD-10-CM

## 2020-09-07 DIAGNOSIS — Z6834 Body mass index (BMI) 34.0-34.9, adult: Secondary | ICD-10-CM | POA: Diagnosis not present

## 2020-09-07 DIAGNOSIS — I10 Essential (primary) hypertension: Secondary | ICD-10-CM

## 2020-09-07 DIAGNOSIS — H9193 Unspecified hearing loss, bilateral: Secondary | ICD-10-CM

## 2020-09-07 DIAGNOSIS — Z6379 Other stressful life events affecting family and household: Secondary | ICD-10-CM | POA: Diagnosis not present

## 2020-09-07 LAB — CBC
HCT: 40 % (ref 39.0–52.0)
Hemoglobin: 13.3 g/dL (ref 13.0–17.0)
MCHC: 33.3 g/dL (ref 30.0–36.0)
MCV: 103.7 fl — ABNORMAL HIGH (ref 78.0–100.0)
Platelets: 126 10*3/uL — ABNORMAL LOW (ref 150.0–400.0)
RBC: 3.86 Mil/uL — ABNORMAL LOW (ref 4.22–5.81)
RDW: 14.6 % (ref 11.5–15.5)
WBC: 4.3 10*3/uL (ref 4.0–10.5)

## 2020-09-07 LAB — VITAMIN B12: Vitamin B-12: 977 pg/mL — ABNORMAL HIGH (ref 211–911)

## 2020-09-07 LAB — BASIC METABOLIC PANEL
BUN: 16 mg/dL (ref 6–23)
CO2: 29 mEq/L (ref 19–32)
Calcium: 8.7 mg/dL (ref 8.4–10.5)
Chloride: 106 mEq/L (ref 96–112)
Creatinine, Ser: 0.65 mg/dL (ref 0.40–1.50)
GFR: 86.52 mL/min (ref >60.00)
Glucose, Bld: 118 mg/dL — ABNORMAL HIGH (ref 70–99)
Potassium: 3.8 mEq/L (ref 3.5–5.1)
Sodium: 143 mEq/L (ref 135–145)

## 2020-09-07 LAB — TSH: TSH: 1.37 u[IU]/mL (ref 0.35–4.50)

## 2020-09-08 ENCOUNTER — Ambulatory Visit: Payer: Medicare PPO | Admitting: Physical Therapy

## 2020-09-08 ENCOUNTER — Other Ambulatory Visit: Payer: Self-pay

## 2020-09-08 ENCOUNTER — Encounter: Payer: Self-pay | Admitting: Physical Therapy

## 2020-09-08 DIAGNOSIS — R2689 Other abnormalities of gait and mobility: Secondary | ICD-10-CM | POA: Diagnosis not present

## 2020-09-08 DIAGNOSIS — R2681 Unsteadiness on feet: Secondary | ICD-10-CM | POA: Diagnosis not present

## 2020-09-08 DIAGNOSIS — M542 Cervicalgia: Secondary | ICD-10-CM

## 2020-09-08 DIAGNOSIS — R42 Dizziness and giddiness: Secondary | ICD-10-CM | POA: Diagnosis not present

## 2020-09-08 DIAGNOSIS — R252 Cramp and spasm: Secondary | ICD-10-CM

## 2020-09-08 DIAGNOSIS — R262 Difficulty in walking, not elsewhere classified: Secondary | ICD-10-CM | POA: Diagnosis not present

## 2020-09-08 NOTE — Therapy (Signed)
[place] [place], [place], [address] Phone: [phone]   Fax:  [phone]  Physical Therapy Treatment  Patient Details  Name: Connor [name] MRN: [id number] Date of Birth: 1933/10/25 Referring Provider (PT): Lamar Blinks, MD   Encounter Date: 09/08/2020   PT End of Session - 09/08/20 1047    Visit Number 10    Number of Visits 21    Date for PT Re-Evaluation 10/23/20    Authorization Type [employer] -    Activity Tolerance Patient tolerated treatment well    Behavior During Therapy [facility] for tasks assessed/performed           Past Medical History:  Diagnosis Date  . Benign localized prostatic hyperplasia with lower urinary tract symptoms (LUTS)    urologist-- dr Diona Fanti  . Chronic anticoagulation    Xarelto for afib  . Chronic constipation   . Chronic cystitis   . Gross hematuria   . History of DVT of lower extremity 06/08/2010   right femoral dvt dx post op total hip 05-08-2010  . History of gastric ulcer    remote  . History of kidney stones    one stone. remote hx  . Hyperlipidemia   . Hypertension    Dx by Dr. Lance Muss around age  47   . NICM (nonischemic cardiomyopathy) ([place])    per echo 06-11-2017,  ef 45-50% with diffuse hypokinesis,  G1DD  . OSA on CPAP 2017   compliant with CPAP.  managed by Dr Elsworth Soho.  . Osteoarthritis   . Other urethral stricture, male, meatal    chronic;  pt self dilates  . Paroxysmal atrial fibrillation [facility]) cardiologist-- dr Rayann Heman   first dx 2009/   a. documented by Dr Barrett Shell on office EKG 9/12. b. maintained on Tikosyn and Xarelto. c. s/p DCCV 's.  d.  x3  EP w/ ablation atrial fib last one 12-05-2016  . Prediabetes   . Premature ventricular contraction   . PVC's (premature ventricular contractions)   . RA (rheumatoid arthritis) [facility])    rheumatologist--- Dr Page Spiro,  treated with Humira  . RBBB (right bundle branch block)    Hx of a fib,  ablation Aug 2018  . Rheumatoid arthritis ([place])   . Vitamin D deficiency   . Wears hearing aid in both ears     Past Surgical History:  Procedure Laterality Date  . ABLATION OF DYSRHYTHMIC FOCUS  08/29/2015  . ATRIAL FIBRILLATION ABLATION  12/05/2016  . ATRIAL FIBRILLATION ABLATION N/A 12/05/2016   Procedure: Atrial Fibrillation Ablation;  Surgeon: [name], MD;  Location: [place] CV LAB;  Service: Cardiovascular;  Laterality: N/A;  . ATRIAL FIBRILLATION ABLATION N/A 02/29/2020   Procedure: ATRIAL FIBRILLATION ABLATION;  Surgeon: [name], MD;  Location: [place] CV LAB;  Service: Cardiovascular;  Laterality: N/A;  . ATRIAL FLUTTER ABLATION  09/2010   by Greggory Brandy  . BUNIONECTOMY Right 1990s  . CARDIOVERSION N/A 10/28/2012   Procedure: CARDIOVERSION;  Surgeon: Burnell Blanks, MD;  Location: [place];  Service: Cardiovascular;  Laterality: N/A;  . CATARACT EXTRACTION W/ INTRAOCULAR LENS  IMPLANT, BILATERAL  2019  . CYSTOSCOPY WITH BIOPSY N/A 09/03/2018   Procedure: CYSTOSCOPY WITH FULGURATION [id number];  Surgeon: Franchot Gallo, MD;  Location: [facility];  Service: Urology;  Laterality: N/A;  30 MINS  . ELECTROPHYSIOLOGIC STUDY N/A 08/29/2015   Procedure: Atrial Fibrillation Ablation;  Surgeon: [name], MD;  Location: [place] CV LAB;  Service: Cardiovascular;  Laterality: N/A;  . KNEE SURGERY Bilateral x3   1960s & 1970s   open  . LEFT HEART CATHETERIZATION WITH CORONARY ANGIOGRAM N/A 06/07/2011   Procedure: LEFT HEART CATHETERIZATION WITH CORONARY ANGIOGRAM;  Surgeon: Peter M Martinique, MD;  Location: [facility] CATH LAB;  Service: Cardiovascular;  Laterality: N/A;    Normal coronary arteries,  low normal LVF  . TONSILLECTOMY AND ADENOIDECTOMY  age 43  . TOTAL HIP ARTHROPLASTY Right 06/04/10    dr Noemi Chapel  '@[facility]'   . TOTAL KNEE ARTHROPLASTY Bilateral right 1995;  left 1997  . TRANSURETHRAL RESECTION OF PROSTATE  06-29-2002   dr humphries  '@[facility]'   . TRANSURETHRAL RESECTION OF PROSTATE  N/A 07/06/2018   Procedure: TRANSURETHRAL RESECTION OF THE PROSTATE (TURP);  Surgeon: Franchot Gallo, MD;  Location: [facility];  Service: Urology;  Laterality: N/A;  45 MINS    There were no vitals filed for this visit.   Subjective Assessment - 09/08/20 1011    Subjective Reports that he had been doing good until late yesterday started to have increased stiffness and some pain    Currently in Pain? Yes    Pain Score 4     Pain Location Neck    Pain Orientation Right    Pain Relieving Factors the treatment help me                             [facility] Adult PT Treatment/Exercise - 09/08/20 0001      Neck Exercises: Machines for Strengthening   UBE (Upper Arm Bike) level 2.4 x 6 minutes    Other Machines for Strengthening 10# rows and extension in the pulleys      Neck Exercises: Standing   Neck Retraction 10 reps;3 secs    Other Standing Exercises 5# shrugs with upper trap and levator stretches      Manual Therapy   Manual Therapy Soft tissue mobilization    Soft tissue mobilization right cervical area and into the right upper trap    Passive ROM cervical PROM to end range with stretch    Manual Traction cervical                    PT Short Term Goals - 07/25/20 1045      PT SHORT TERM GOAL #1   Title Pt will be I and compliant with initial HEP.    Status Achieved             PT Long Term Goals - 09/04/20 1000      PT LONG TERM GOAL #1   Title Pt will be independent with long term HEP for improved posture.    Status Partially Met      PT LONG TERM GOAL #2   Title Pt will walk for 30 minutes with no report of lightheadedness, as needed for daily exercise and errands.    Status Partially Met      PT LONG TERM GOAL #3   Title Pt will demo pain free cervical AROM in all planes, nearly symmetrical from side to side .    Status Achieved      PT LONG TERM GOAL #4   Title Pt will demo FOTO score of at least 64%,  demonstrating significant change in subjective ability.    Status Partially Met                 Plan - 09/08/20  1048    Clinical Impression Statement Patient had some increased pain again yesterday, reports that he did well after our visit until last night, he is unsure of whay the increase of pain.  The right cervical parapsinals, the right SCM, upper trap and levator are in spasm today.    PT Next Visit Plan will continue to work toward decreased pain, tightness and less dizziness    Consulted and Agree with Plan of Care Patient           Patient will benefit from skilled therapeutic intervention in order to improve the following deficits and impairments:  Pain,Postural dysfunction,Increased fascial restricitons,Decreased activity tolerance,Decreased balance,Decreased mobility,Hypomobility,Improper body mechanics,Impaired perceived functional ability,Decreased range of motion,Increased muscle spasms  Visit Diagnosis: Cervicalgia  Cramp and spasm  Other abnormalities of gait and mobility  Unsteadiness on feet  Dizziness and giddiness     Problem List Patient Active Problem List   Diagnosis Date Noted  . Secondary hypercoagulable state ([place]) 03/28/2020  . Malignant neoplasm of prostate ([place]) 09/11/2018  . Enlarged prostate with urinary obstruction 07/06/2018  . Gastrocnemius strain, right, initial encounter 12/10/2017  . Baker's cyst of knee, right 12/10/2017  . Obesity 03/24/2017  . Paroxysmal atrial fibrillation ([place]) 12/05/2016  . Near syncope 04/20/2016  . Leg hematoma, left, initial encounter 04/20/2016  . Bleeding   . Ecchymosis   . Left hamstring muscle strain   . Muscle tear   . RBBB 08/30/2015  . Chronic systolic dysfunction of left ventricle 07/11/2014  . OSA (obstructive sleep apnea) 04/07/2013  . Multinodular goiter 01/20/2013  . Cervical spondylosis without myelopathy 01/18/2013  . Atrial fibrillation with RVR ([place]) 10/27/2012  . Left  ventricular dysfunction 12/17/2011  . Low back pain 09/11/2011  . Fatigue 05/22/2011  . PVC (premature ventricular contraction) 01/18/2011  . Persistent atrial fibrillation ([place]) 01/12/2011  . Hyperlipidemia 08/02/2010  . Hypertension 08/02/2010  . Osteoarthritis 08/02/2010  . BPH (benign prostatic hyperplasia) 08/02/2010  . GERD (gastroesophageal reflux disease) 08/02/2010  . h/o Atrial flutter [facility]) s/p ablation 2012     Dalayna Lauter W., PT 09/08/2020, 10:50 AM  [address] [place], [place], [address] Phone: [phone]   Fax:  [phone]  Name: Connor [name] MRN: [id number] Date of Birth: 03-Jun-1933

## 2020-09-09 ENCOUNTER — Encounter: Payer: Self-pay | Admitting: Family Medicine

## 2020-09-11 ENCOUNTER — Encounter: Payer: Self-pay | Admitting: Physical Therapy

## 2020-09-11 ENCOUNTER — Other Ambulatory Visit: Payer: Self-pay

## 2020-09-11 ENCOUNTER — Ambulatory Visit: Payer: Medicare PPO | Admitting: Physical Therapy

## 2020-09-11 DIAGNOSIS — R2681 Unsteadiness on feet: Secondary | ICD-10-CM

## 2020-09-11 DIAGNOSIS — R42 Dizziness and giddiness: Secondary | ICD-10-CM | POA: Diagnosis not present

## 2020-09-11 DIAGNOSIS — R2689 Other abnormalities of gait and mobility: Secondary | ICD-10-CM

## 2020-09-11 DIAGNOSIS — M542 Cervicalgia: Secondary | ICD-10-CM | POA: Diagnosis not present

## 2020-09-11 DIAGNOSIS — R252 Cramp and spasm: Secondary | ICD-10-CM

## 2020-09-11 DIAGNOSIS — R262 Difficulty in walking, not elsewhere classified: Secondary | ICD-10-CM | POA: Diagnosis not present

## 2020-09-11 NOTE — Therapy (Signed)
[place] [place], [place], [address] Phone: [phone]   Fax:  [phone]  Physical Therapy Treatment  Patient Details  Name: Connor [name] MRN: [id number] Date of Birth: 08-12-1933 Referring Provider (PT): Lamar Blinks, MD   Encounter Date: 09/11/2020   PT End of Session - 09/11/20 1007    Visit Number 11    Number of Visits 21    Date for PT Re-Evaluation 10/23/20    Authorization Type [employer] -    PT Start Time 0922    PT Stop Time 1019    PT Time Calculation (min) 57 min    Activity Tolerance Patient tolerated treatment well    Behavior During Therapy [facility] for tasks assessed/performed           Past Medical History:  Diagnosis Date  . Benign localized prostatic hyperplasia with lower urinary tract symptoms (LUTS)    urologist-- dr Diona Fanti  . Chronic anticoagulation    Xarelto for afib  . Chronic constipation   . Chronic cystitis   . Gross hematuria   . History of DVT of lower extremity 06/08/2010   right femoral dvt dx post op total hip 05-08-2010  . History of gastric ulcer    remote  . History of kidney stones    one stone. remote hx  . Hyperlipidemia   . Hypertension    Dx by Dr. Lance Muss around age  78   . NICM (nonischemic cardiomyopathy) ([place])    per echo 06-11-2017,  ef 45-50% with diffuse hypokinesis,  G1DD  . OSA on CPAP 2017   compliant with CPAP.  managed by Dr Elsworth Soho.  . Osteoarthritis   . Other urethral stricture, male, meatal    chronic;  pt self dilates  . Paroxysmal atrial fibrillation [facility]) cardiologist-- dr Rayann Heman   first dx 2009/   a. documented by Dr Barrett Shell on office EKG 9/12. b. maintained on Tikosyn and Xarelto. c. s/p DCCV 's.  d.  x3  EP w/ ablation atrial fib last one 12-05-2016  . Prediabetes   . Premature ventricular contraction   . PVC's (premature ventricular contractions)   . RA (rheumatoid arthritis) [facility])    rheumatologist--- Dr Page Spiro,  treated with Humira  . RBBB (right bundle branch block)    Hx of a fib, ablation Aug 2018  . Rheumatoid arthritis ([place])   . Vitamin D deficiency   . Wears hearing aid in both ears     Past Surgical History:  Procedure Laterality Date  . ABLATION OF DYSRHYTHMIC FOCUS  08/29/2015  . ATRIAL FIBRILLATION ABLATION  12/05/2016  . ATRIAL FIBRILLATION ABLATION N/A 12/05/2016   Procedure: Atrial Fibrillation Ablation;  Surgeon: [name], MD;  Location: [place] CV LAB;  Service: Cardiovascular;  Laterality: N/A;  . ATRIAL FIBRILLATION ABLATION N/A 02/29/2020   Procedure: ATRIAL FIBRILLATION ABLATION;  Surgeon: [name], MD;  Location: [place] CV LAB;  Service: Cardiovascular;  Laterality: N/A;  . ATRIAL FLUTTER ABLATION  09/2010   by Greggory Brandy  . BUNIONECTOMY Right 1990s  . CARDIOVERSION N/A 10/28/2012   Procedure: CARDIOVERSION;  Surgeon: Burnell Blanks, MD;  Location: [place];  Service: Cardiovascular;  Laterality: N/A;  . CATARACT EXTRACTION W/ INTRAOCULAR LENS  IMPLANT, BILATERAL  2019  . CYSTOSCOPY WITH BIOPSY N/A 09/03/2018   Procedure: CYSTOSCOPY WITH FULGURATION AJOINOMVE;  Surgeon: Franchot Gallo, MD;  Location: [facility];  Service: Urology;  Laterality: N/A;  30 MINS  .  ELECTROPHYSIOLOGIC STUDY N/A 08/29/2015   Procedure: Atrial Fibrillation Ablation;  Surgeon: [name], MD;  Location: [place] CV LAB;  Service: Cardiovascular;  Laterality: N/A;  . KNEE SURGERY Bilateral x3   1960s & 1970s   open  . LEFT HEART CATHETERIZATION WITH CORONARY ANGIOGRAM N/A 06/07/2011   Procedure: LEFT HEART CATHETERIZATION WITH CORONARY ANGIOGRAM;  Surgeon: Peter M Martinique, MD;  Location: [facility] CATH LAB;  Service: Cardiovascular;  Laterality: N/A;    Normal coronary arteries,  low normal LVF  . TONSILLECTOMY AND ADENOIDECTOMY  age 83  . TOTAL HIP ARTHROPLASTY Right 06/04/10    dr Noemi Chapel  '@[facility]'   . TOTAL KNEE ARTHROPLASTY Bilateral right 1995;  left 1997  . TRANSURETHRAL RESECTION OF  PROSTATE  06-29-2002   dr humphries  '@[facility]'   . TRANSURETHRAL RESECTION OF PROSTATE N/A 07/06/2018   Procedure: TRANSURETHRAL RESECTION OF THE PROSTATE (TURP);  Surgeon: Franchot Gallo, MD;  Location: [facility];  Service: Urology;  Laterality: N/A;  45 MINS    There were no vitals filed for this visit.   Subjective Assessment - 09/11/20 0924    Subjective I have been feeling good    Currently in Pain? Yes    Pain Score 2     Pain Location Neck    Pain Orientation Right                             [facility] Adult PT Treatment/Exercise - 09/11/20 0001      Ambulation/Gait   Gait Comments outside around the back building required 3 rest breaks due to fatigue      High Level Balance   High Level Balance Comments airex balance beam tandem walk and side stepping      Neck Exercises: Machines for Strengthening   UBE (Upper Arm Bike) level3 x 6 minutes    Cybex Row 25# 2x10    Lat Pull 25# 2x10    Other Machines for Strengthening 10# rows and extension in the pulleys    Other Machines for Strengthening 5# knee ext, 35# leg curls      Neck Exercises: Theraband   Shoulder Extension 20 reps;Green    Rows 20 reps;Green    Shoulder External Rotation 20 reps;Red      Neck Exercises: Standing   Other Standing Exercises back to wall weighted ball overhead lift, W backs with 3#, doorway stretches, gentle cervical AROM to the end ranges      Traction   Type of Traction Cervical    Max (lbs) 16    Hold Time static    Time 12                    PT Short Term Goals - 07/25/20 1045      PT SHORT TERM GOAL #1   Title Pt will be I and compliant with initial HEP.    Status Achieved             PT Long Term Goals - 09/04/20 1000      PT LONG TERM GOAL #1   Title Pt will be independent with long term HEP for improved posture.    Status Partially Met      PT LONG TERM GOAL #2   Title Pt will walk for 30 minutes with no report of  lightheadedness, as needed for daily exercise and errands.    Status Partially Met  PT LONG TERM GOAL #3   Title Pt will demo pain free cervical AROM in all planes, nearly symmetrical from side to side .    Status Achieved      PT LONG TERM GOAL #4   Title Pt will demo FOTO score of at least 64%, demonstrating significant change in subjective ability.    Status Partially Met                 Plan - 09/11/20 1007    Clinical Impression Statement started talking to patient about D/C and what to do on his own, regarding balance, endurance, and the neck pain and spasm.  He is limited with his endurance, he does not go to the gym due to covid but we can help what he is doing with an advanced HEP.  Had difficulty with the tandem balance.    PT Next Visit Plan talk further about d/c and finalize HEP    Consulted and Agree with Plan of Care Patient           Patient will benefit from skilled therapeutic intervention in order to improve the following deficits and impairments:  Pain,Postural dysfunction,Increased fascial restricitons,Decreased activity tolerance,Decreased balance,Decreased mobility,Hypomobility,Improper body mechanics,Impaired perceived functional ability,Decreased range of motion,Increased muscle spasms  Visit Diagnosis: Cervicalgia  Cramp and spasm  Other abnormalities of gait and mobility  Unsteadiness on feet  Dizziness and giddiness     Problem List Patient Active Problem List   Diagnosis Date Noted  . Secondary hypercoagulable state ([place]) 03/28/2020  . Malignant neoplasm of prostate ([place]) 09/11/2018  . Enlarged prostate with urinary obstruction 07/06/2018  . Gastrocnemius strain, right, initial encounter 12/10/2017  . Baker's cyst of knee, right 12/10/2017  . Obesity 03/24/2017  . Paroxysmal atrial fibrillation ([place]) 12/05/2016  . Near syncope 04/20/2016  . Leg hematoma, left, initial encounter 04/20/2016  . Bleeding   . Ecchymosis   . Left  hamstring muscle strain   . Muscle tear   . RBBB 08/30/2015  . Chronic systolic dysfunction of left ventricle 07/11/2014  . OSA (obstructive sleep apnea) 04/07/2013  . Multinodular goiter 01/20/2013  . Cervical spondylosis without myelopathy 01/18/2013  . Atrial fibrillation with RVR ([place]) 10/27/2012  . Left ventricular dysfunction 12/17/2011  . Low back pain 09/11/2011  . Fatigue 05/22/2011  . PVC (premature ventricular contraction) 01/18/2011  . Persistent atrial fibrillation ([place]) 01/12/2011  . Hyperlipidemia 08/02/2010  . Hypertension 08/02/2010  . Osteoarthritis 08/02/2010  . BPH (benign prostatic hyperplasia) 08/02/2010  . GERD (gastroesophageal reflux disease) 08/02/2010  . h/o Atrial flutter [facility]) s/p ablation 2012     Kristian Hazzard W., PT 09/11/2020, 10:10 AM  [place] [place], [place], [address] Phone: [phone]   Fax:  [phone]  Name: Connor [name] MRN: [id number] Date of Birth: 06/13/1933

## 2020-09-12 DIAGNOSIS — B351 Tinea unguium: Secondary | ICD-10-CM | POA: Diagnosis not present

## 2020-09-12 DIAGNOSIS — L84 Corns and callosities: Secondary | ICD-10-CM | POA: Diagnosis not present

## 2020-09-12 DIAGNOSIS — L603 Nail dystrophy: Secondary | ICD-10-CM | POA: Diagnosis not present

## 2020-09-12 DIAGNOSIS — G603 Idiopathic progressive neuropathy: Secondary | ICD-10-CM | POA: Diagnosis not present

## 2020-09-13 ENCOUNTER — Other Ambulatory Visit: Payer: Self-pay

## 2020-09-13 ENCOUNTER — Encounter: Payer: Self-pay | Admitting: Physical Therapy

## 2020-09-13 ENCOUNTER — Ambulatory Visit: Payer: Medicare PPO | Admitting: Physical Therapy

## 2020-09-13 DIAGNOSIS — R2681 Unsteadiness on feet: Secondary | ICD-10-CM | POA: Diagnosis not present

## 2020-09-13 DIAGNOSIS — R2689 Other abnormalities of gait and mobility: Secondary | ICD-10-CM | POA: Diagnosis not present

## 2020-09-13 DIAGNOSIS — R252 Cramp and spasm: Secondary | ICD-10-CM

## 2020-09-13 DIAGNOSIS — R42 Dizziness and giddiness: Secondary | ICD-10-CM | POA: Diagnosis not present

## 2020-09-13 DIAGNOSIS — M542 Cervicalgia: Secondary | ICD-10-CM | POA: Diagnosis not present

## 2020-09-13 DIAGNOSIS — R262 Difficulty in walking, not elsewhere classified: Secondary | ICD-10-CM | POA: Diagnosis not present

## 2020-09-13 NOTE — Therapy (Signed)
[place]. [place], [place], [address] Phone: [phone]   Fax:  [phone]  Physical Therapy Treatment  Patient Details  Name: Connor [name] MRN: [id number] Date of Birth: 03-12-1934 Referring Provider (PT): Lamar Blinks, MD   Encounter Date: 09/13/2020   PT End of Session - 09/13/20 1000    Visit Number 12    Date for PT Re-Evaluation 10/23/20    Activity Tolerance Patient tolerated treatment well    Behavior During Therapy [facility] for tasks assessed/performed           Past Medical History:  Diagnosis Date  . Benign localized prostatic hyperplasia with lower urinary tract symptoms (LUTS)    urologist-- dr Diona Fanti  . Chronic anticoagulation    Xarelto for afib  . Chronic constipation   . Chronic cystitis   . Gross hematuria   . History of DVT of lower extremity 06/08/2010   right femoral dvt dx post op total hip 05-08-2010  . History of gastric ulcer    remote  . History of kidney stones    one stone. remote hx  . Hyperlipidemia   . Hypertension    Dx by Dr. Lance Muss around age  87   . NICM (nonischemic cardiomyopathy) ([place])    per echo 06-11-2017,  ef 45-50% with diffuse hypokinesis,  G1DD  . OSA on CPAP 2017   compliant with CPAP.  managed by Dr Elsworth Soho.  . Osteoarthritis   . Other urethral stricture, male, meatal    chronic;  pt self dilates  . Paroxysmal atrial fibrillation [facility]) cardiologist-- dr Rayann Heman   first dx 2009/   a. documented by Dr Barrett Shell on office EKG 9/12. b. maintained on Tikosyn and Xarelto. c. s/p DCCV 's.  d.  x3  EP w/ ablation atrial fib last one 12-05-2016  . Prediabetes   . Premature ventricular contraction   . PVC's (premature ventricular contractions)   . RA (rheumatoid arthritis) [facility])    rheumatologist--- Dr Page Spiro,  treated with Humira  . RBBB (right bundle branch block)    Hx of a fib, ablation Aug 2018  . Rheumatoid arthritis ([place])   . Vitamin D  deficiency   . Wears hearing aid in both ears     Past Surgical History:  Procedure Laterality Date  . ABLATION OF DYSRHYTHMIC FOCUS  08/29/2015  . ATRIAL FIBRILLATION ABLATION  12/05/2016  . ATRIAL FIBRILLATION ABLATION N/A 12/05/2016   Procedure: Atrial Fibrillation Ablation;  Surgeon: [name], MD;  Location: [place] CV LAB;  Service: Cardiovascular;  Laterality: N/A;  . ATRIAL FIBRILLATION ABLATION N/A 02/29/2020   Procedure: ATRIAL FIBRILLATION ABLATION;  Surgeon: [name], MD;  Location: [place] CV LAB;  Service: Cardiovascular;  Laterality: N/A;  . ATRIAL FLUTTER ABLATION  09/2010   by Greggory Brandy  . BUNIONECTOMY Right 1990s  . CARDIOVERSION N/A 10/28/2012   Procedure: CARDIOVERSION;  Surgeon: Burnell Blanks, MD;  Location: [place];  Service: Cardiovascular;  Laterality: N/A;  . CATARACT EXTRACTION W/ INTRAOCULAR LENS  IMPLANT, BILATERAL  2019  . CYSTOSCOPY WITH BIOPSY N/A 09/03/2018   Procedure: CYSTOSCOPY WITH FULGURATION [id number];  Surgeon: Franchot Gallo, MD;  [facility];  Service: Urology;  Laterality: N/A;  30 MINS  . ELECTROPHYSIOLOGIC STUDY N/A 08/29/2015   Procedure: Atrial Fibrillation Ablation;  Surgeon: [name], MD;  Location: [place] CV LAB;  Service: Cardiovascular;  Laterality: N/A;  . KNEE SURGERY Bilateral x3   1960s &  1970s   open  . LEFT HEART CATHETERIZATION WITH CORONARY ANGIOGRAM N/A 06/07/2011   Procedure: LEFT HEART CATHETERIZATION WITH CORONARY ANGIOGRAM;  Surgeon: Peter M Martinique, MD;  Location: [facility] CATH LAB;  Service: Cardiovascular;  Laterality: N/A;    Normal coronary arteries,  low normal LVF  . TONSILLECTOMY AND ADENOIDECTOMY  age 32  . TOTAL HIP ARTHROPLASTY Right 06/04/10    dr Noemi Chapel  '@[facility]'   . TOTAL KNEE ARTHROPLASTY Bilateral right 1995;  left 1997  . TRANSURETHRAL RESECTION OF PROSTATE  06-29-2002   dr humphries  '@[facility]'   . TRANSURETHRAL RESECTION OF PROSTATE N/A 07/06/2018   Procedure: TRANSURETHRAL RESECTION OF THE PROSTATE  (TURP);  Surgeon: Franchot Gallo, MD;  Location: [facility];  Service: Urology;  Laterality: N/A;  45 MINS    There were no vitals filed for this visit.   Subjective Assessment - 09/13/20 0936    Subjective Still doing well    Currently in Pain? No/denies                             [facility] Adult PT Treatment/Exercise - 09/13/20 0001      Ambulation/Gait   Gait Comments outside around the back building required 2 rest breaks due to fatigue      Neck Exercises: Standing   Other Standing Exercises 8# shrugs with upper trap and levator stretches    Other Standing Exercises back to wall weighted ball overhead lift, W backs with 3#, doorway stretches, gentle cervical AROM to the end ranges      Traction   Type of Traction Cervical    Max (lbs) 16    Hold Time static    Time 12      Manual Therapy   Manual Therapy Soft tissue mobilization    Soft tissue mobilization right cervical area and into the right upper trap    Passive ROM cervical PROM to end range with stretch                    PT Short Term Goals - 07/25/20 1045      PT SHORT TERM GOAL #1   Title Pt will be I and compliant with initial HEP.    Status Achieved             PT Long Term Goals - 09/13/20 1002      PT LONG TERM GOAL #1   Title Pt will be independent with long term HEP for improved posture.    Status Achieved      PT LONG TERM GOAL #2   Title Pt will walk for 30 minutes with no report of lightheadedness, as needed for daily exercise and errands.    Status Achieved      PT LONG TERM GOAL #3   Title Pt will demo pain free cervical AROM in all planes, nearly symmetrical from side to side .    Status Achieved      PT LONG TERM GOAL #4   Title Pt will demo FOTO score of at least 64%, demonstrating significant change in subjective ability.    Status Achieved                 Plan - 09/13/20 1001    Clinical Impression Statement Went over the  HEP for cervical ROM, the tightness in the right side of the neck how to stretch safely and went over HEP with tband, also  touched on the walking and the VOR type exercises for the future as he had this issue a year ago.    PT Next Visit Plan D/C goals met    Consulted and Agree with Plan of Care Patient           Patient will benefit from skilled therapeutic intervention in order to improve the following deficits and impairments:  Pain,Postural dysfunction,Increased fascial restricitons,Decreased activity tolerance,Decreased balance,Decreased mobility,Hypomobility,Improper body mechanics,Impaired perceived functional ability,Decreased range of motion,Increased muscle spasms  Visit Diagnosis: Cervicalgia  Cramp and spasm  Other abnormalities of gait and mobility  Unsteadiness on feet  Dizziness and giddiness     Problem List Patient Active Problem List   Diagnosis Date Noted  . Secondary hypercoagulable state ([place]) 03/28/2020  . Malignant neoplasm of prostate ([place]) 09/11/2018  . Enlarged prostate with urinary obstruction 07/06/2018  . Gastrocnemius strain, right, initial encounter 12/10/2017  . Baker's cyst of knee, right 12/10/2017  . Obesity 03/24/2017  . Paroxysmal atrial fibrillation ([place]) 12/05/2016  . Near syncope 04/20/2016  . Leg hematoma, left, initial encounter 04/20/2016  . Bleeding   . Ecchymosis   . Left hamstring muscle strain   . Muscle tear   . RBBB 08/30/2015  . Chronic systolic dysfunction of left ventricle 07/11/2014  . OSA (obstructive sleep apnea) 04/07/2013  . Multinodular goiter 01/20/2013  . Cervical spondylosis without myelopathy 01/18/2013  . Atrial fibrillation with RVR ([place]) 10/27/2012  . Left ventricular dysfunction 12/17/2011  . Low back pain 09/11/2011  . Fatigue 05/22/2011  . PVC (premature ventricular contraction) 01/18/2011  . Persistent atrial fibrillation ([place]) 01/12/2011  . Hyperlipidemia 08/02/2010  . Hypertension 08/02/2010   . Osteoarthritis 08/02/2010  . BPH (benign prostatic hyperplasia) 08/02/2010  . GERD (gastroesophageal reflux disease) 08/02/2010  . h/o Atrial flutter [facility]) s/p ablation 2012     Yaneliz Radebaugh W., PT 09/13/2020, 10:03 AM  [place]. [place], [place], [address] Phone: [phone]   Fax:  [phone]  Name: ANEL PUROHIT MRN: [id number] Date of Birth: [birth year]

## 2020-09-26 ENCOUNTER — Other Ambulatory Visit: Payer: Self-pay | Admitting: Family Medicine

## 2020-09-29 DIAGNOSIS — H353132 Nonexudative age-related macular degeneration, bilateral, intermediate dry stage: Secondary | ICD-10-CM | POA: Diagnosis not present

## 2020-10-06 ENCOUNTER — Other Ambulatory Visit: Payer: Self-pay

## 2020-10-06 ENCOUNTER — Encounter (INDEPENDENT_AMBULATORY_CARE_PROVIDER_SITE_OTHER): Payer: Medicare PPO | Admitting: Ophthalmology

## 2020-10-06 DIAGNOSIS — H35033 Hypertensive retinopathy, bilateral: Secondary | ICD-10-CM

## 2020-10-06 DIAGNOSIS — H43813 Vitreous degeneration, bilateral: Secondary | ICD-10-CM

## 2020-10-06 DIAGNOSIS — H353132 Nonexudative age-related macular degeneration, bilateral, intermediate dry stage: Secondary | ICD-10-CM

## 2020-10-06 DIAGNOSIS — I10 Essential (primary) hypertension: Secondary | ICD-10-CM

## 2020-10-24 DIAGNOSIS — M2550 Pain in unspecified joint: Secondary | ICD-10-CM | POA: Diagnosis not present

## 2020-10-24 DIAGNOSIS — M150 Primary generalized (osteo)arthritis: Secondary | ICD-10-CM | POA: Diagnosis not present

## 2020-10-24 DIAGNOSIS — E669 Obesity, unspecified: Secondary | ICD-10-CM | POA: Diagnosis not present

## 2020-10-24 DIAGNOSIS — Z6830 Body mass index (BMI) 30.0-30.9, adult: Secondary | ICD-10-CM | POA: Diagnosis not present

## 2020-10-24 DIAGNOSIS — R5383 Other fatigue: Secondary | ICD-10-CM | POA: Diagnosis not present

## 2020-10-24 DIAGNOSIS — Z79899 Other long term (current) drug therapy: Secondary | ICD-10-CM | POA: Diagnosis not present

## 2020-10-24 DIAGNOSIS — M0600 Rheumatoid arthritis without rheumatoid factor, unspecified site: Secondary | ICD-10-CM | POA: Diagnosis not present

## 2020-10-29 DIAGNOSIS — H353132 Nonexudative age-related macular degeneration, bilateral, intermediate dry stage: Secondary | ICD-10-CM | POA: Diagnosis not present

## 2020-11-06 ENCOUNTER — Other Ambulatory Visit: Payer: Self-pay | Admitting: Family Medicine

## 2020-11-06 DIAGNOSIS — E785 Hyperlipidemia, unspecified: Secondary | ICD-10-CM

## 2020-11-15 ENCOUNTER — Other Ambulatory Visit: Payer: Self-pay | Admitting: Internal Medicine

## 2020-11-16 NOTE — Telephone Encounter (Signed)
Prescription refill request for Xarelto received.  Indication: PAF Last office visit: 09/06/20  Clearnce Hasten MD Weight: 101.6kg Age: 87 Scr: 0.65 on 09/07/20 CrCl: 121.57  Based on above findings Xarelto 20mg  daily is the appropriate dose.  Refill approved.

## 2020-11-28 DIAGNOSIS — H353132 Nonexudative age-related macular degeneration, bilateral, intermediate dry stage: Secondary | ICD-10-CM | POA: Diagnosis not present

## 2020-12-05 DIAGNOSIS — L84 Corns and callosities: Secondary | ICD-10-CM | POA: Diagnosis not present

## 2020-12-05 DIAGNOSIS — B351 Tinea unguium: Secondary | ICD-10-CM | POA: Diagnosis not present

## 2020-12-05 DIAGNOSIS — L603 Nail dystrophy: Secondary | ICD-10-CM | POA: Diagnosis not present

## 2020-12-05 DIAGNOSIS — G603 Idiopathic progressive neuropathy: Secondary | ICD-10-CM | POA: Diagnosis not present

## 2020-12-15 ENCOUNTER — Encounter (HOSPITAL_BASED_OUTPATIENT_CLINIC_OR_DEPARTMENT_OTHER): Payer: Self-pay

## 2020-12-15 ENCOUNTER — Emergency Department (HOSPITAL_BASED_OUTPATIENT_CLINIC_OR_DEPARTMENT_OTHER): Payer: Medicare PPO

## 2020-12-15 ENCOUNTER — Other Ambulatory Visit: Payer: Self-pay

## 2020-12-15 ENCOUNTER — Emergency Department (HOSPITAL_BASED_OUTPATIENT_CLINIC_OR_DEPARTMENT_OTHER)
Admission: EM | Admit: 2020-12-15 | Discharge: 2020-12-15 | Disposition: A | Discharge status: Home / Self Care | Payer: Medicare PPO | Attending: Emergency Medicine | Admitting: Emergency Medicine

## 2020-12-15 DIAGNOSIS — I5022 Chronic systolic (congestive) heart failure: Secondary | ICD-10-CM | POA: Insufficient documentation

## 2020-12-15 DIAGNOSIS — Z79899 Other long term (current) drug therapy: Secondary | ICD-10-CM | POA: Insufficient documentation

## 2020-12-15 DIAGNOSIS — S299XXA Unspecified injury of thorax, initial encounter: Secondary | ICD-10-CM | POA: Diagnosis present

## 2020-12-15 DIAGNOSIS — Z96641 Presence of right artificial hip joint: Secondary | ICD-10-CM | POA: Insufficient documentation

## 2020-12-15 DIAGNOSIS — S20212A Contusion of left front wall of thorax, initial encounter: Secondary | ICD-10-CM | POA: Insufficient documentation

## 2020-12-15 DIAGNOSIS — I4819 Other persistent atrial fibrillation: Secondary | ICD-10-CM | POA: Diagnosis not present

## 2020-12-15 DIAGNOSIS — Z8546 Personal history of malignant neoplasm of prostate: Secondary | ICD-10-CM | POA: Diagnosis not present

## 2020-12-15 DIAGNOSIS — Z7901 Long term (current) use of anticoagulants: Secondary | ICD-10-CM | POA: Insufficient documentation

## 2020-12-15 DIAGNOSIS — S2232XA Fracture of one rib, left side, initial encounter for closed fracture: Secondary | ICD-10-CM | POA: Diagnosis not present

## 2020-12-15 DIAGNOSIS — W07XXXA Fall from chair, initial encounter: Secondary | ICD-10-CM | POA: Insufficient documentation

## 2020-12-15 DIAGNOSIS — Z87891 Personal history of nicotine dependence: Secondary | ICD-10-CM | POA: Diagnosis not present

## 2020-12-15 DIAGNOSIS — I10 Essential (primary) hypertension: Secondary | ICD-10-CM | POA: Insufficient documentation

## 2020-12-15 DIAGNOSIS — Z96653 Presence of artificial knee joint, bilateral: Secondary | ICD-10-CM | POA: Insufficient documentation

## 2020-12-15 DIAGNOSIS — I119 Hypertensive heart disease without heart failure: Secondary | ICD-10-CM | POA: Diagnosis not present

## 2020-12-15 NOTE — ED Triage Notes (Signed)
Patient fell on Wednesday while sitting in chair on lawn, left arm rest on chair hit patient in left rib cage, small bruise per patient, denies sob or other symptoms

## 2020-12-15 NOTE — ED Provider Notes (Signed)
[facility] EMERGENCY DEPARTMENT Provider Note   CSN: [id number] Arrival date & time: 12/15/20  0930     History Chief Complaint  Patient presents with   Rib Injury    Connor [name] is a 87 y.o. male.  The history is provided by the patient.  Chest Pain Pain location:  L chest Pain quality: sharp   Pain radiates to:  Does not radiate Pain severity:  Mild Onset quality:  Sudden Duration:  3 days Timing:  Constant Progression:  Unchanged Chronicity:  New Context comment:  Fell while watering plants. Arm of chair struck his left chest wall. Relieved by:  Rest Exacerbated by: turning over in bed. Associated symptoms: no abdominal pain, no back pain, no cough, no fever, no lower extremity edema, no nausea, no palpitations, no shortness of breath and no vomiting       Past Medical History:  Diagnosis Date   Benign localized prostatic hyperplasia with lower urinary tract symptoms (LUTS)    urologist-- dr Diona Fanti   Chronic anticoagulation    Xarelto for afib   Chronic constipation    Chronic cystitis    Gross hematuria    History of DVT of lower extremity 06/08/2010   right femoral dvt dx post op total hip 05-08-2010   History of gastric ulcer    remote   History of kidney stones    one stone. remote hx   Hyperlipidemia    Hypertension    Dx by Dr. Lance Muss around age  54    NICM (nonischemic cardiomyopathy) ([place])    per echo 06-11-2017,  ef 45-50% with diffuse hypokinesis,  G1DD   OSA on CPAP 2017   compliant with CPAP.  managed by Dr Elsworth Soho.   Osteoarthritis    Other urethral stricture, male, meatal    chronic;  pt self dilates   Paroxysmal atrial fibrillation [facility]) cardiologist-- dr Rayann Heman   first dx 2009/   a. documented by Dr Barrett Shell on office EKG 9/12. b. maintained on Tikosyn and Xarelto. c. s/p DCCV 's.  d.  x3  EP w/ ablation atrial fib last one 12-05-2016   Prediabetes    Premature ventricular contraction    PVC's (premature ventricular  contractions)    RA (rheumatoid arthritis) ([place])    rheumatologist--- Dr Page Spiro,  treated with Humira   RBBB (right bundle branch block)    Hx of a fib, ablation Aug 2018   Rheumatoid arthritis ([place])    Vitamin D deficiency    Wears hearing aid in both ears     Patient Active Problem List   Diagnosis Date Noted   Secondary hypercoagulable state ([place]) 03/28/2020   Malignant neoplasm of prostate ([place]) 09/11/2018   Enlarged prostate with urinary obstruction 07/06/2018   Gastrocnemius strain, right, initial encounter 12/10/2017   Baker's cyst of knee, right 12/10/2017   Obesity 03/24/2017   Paroxysmal atrial fibrillation ([place]) 12/05/2016   Near syncope 04/20/2016   Leg hematoma, left, initial encounter 04/20/2016   Bleeding    Ecchymosis    Left hamstring muscle strain    Muscle tear    RBBB 0000000   Chronic systolic dysfunction of left ventricle 07/11/2014   OSA (obstructive sleep apnea) 04/07/2013   Multinodular goiter 01/20/2013   Cervical spondylosis without myelopathy 01/18/2013   Atrial fibrillation with RVR ([place]) 10/27/2012   Left ventricular dysfunction 12/17/2011   Low back pain 09/11/2011   Fatigue 05/22/2011   PVC (premature ventricular contraction) 01/18/2011  Persistent atrial fibrillation ([place]) 01/12/2011   Hyperlipidemia 08/02/2010   Hypertension 08/02/2010   Osteoarthritis 08/02/2010   BPH (benign prostatic hyperplasia) 08/02/2010   GERD (gastroesophageal reflux disease) 08/02/2010   h/o Atrial flutter ([place]) s/p ablation 2012     Past Surgical History:  Procedure Laterality Date   ABLATION OF DYSRHYTHMIC FOCUS  08/29/2015   ATRIAL FIBRILLATION ABLATION  12/05/2016   ATRIAL FIBRILLATION ABLATION N/A 12/05/2016   Procedure: Atrial Fibrillation Ablation;  Surgeon: [name], MD;  Location: [facility] CV LAB;  Service: Cardiovascular;  Laterality: N/A;   ATRIAL FIBRILLATION ABLATION N/A 02/29/2020   Procedure: ATRIAL FIBRILLATION ABLATION;   Surgeon: [name], MD;  Location: [facility] CV LAB;  Service: Cardiovascular;  Laterality: N/A;   ATRIAL FLUTTER ABLATION  09/2010   by Glenford Bayley Right 1990s   CARDIOVERSION N/A 10/28/2012   Procedure: CARDIOVERSION;  Surgeon: Burnell Blanks, MD;  Location: [facility];  Service: Cardiovascular;  Laterality: N/A;   CATARACT EXTRACTION W/ INTRAOCULAR LENS  IMPLANT, BILATERAL  2019   CYSTOSCOPY WITH BIOPSY N/A 09/03/2018   Procedure: CYSTOSCOPY WITH FULGURATION Fran Lowes;  Surgeon: Franchot Gallo, MD;  Location: [facility];  Service: Urology;  Laterality: N/A;  30 MINS   ELECTROPHYSIOLOGIC STUDY N/A 08/29/2015   Procedure: Atrial Fibrillation Ablation;  Surgeon: [name], MD;  Location: [facility] CV LAB;  Service: Cardiovascular;  Laterality: N/A;   KNEE SURGERY Bilateral x3   1960s & 1970s   open   LEFT HEART CATHETERIZATION WITH CORONARY ANGIOGRAM N/A 06/07/2011   Procedure: LEFT HEART CATHETERIZATION WITH CORONARY ANGIOGRAM;  Surgeon: Peter M Martinique, MD;  [facility] CATH LAB;  Service: Cardiovascular;  Laterality: N/A;    Normal coronary arteries,  low normal LVF   TONSILLECTOMY AND ADENOIDECTOMY  age 59   TOTAL HIP ARTHROPLASTY Right 06/04/10    dr Noemi Chapel  '@[facility]'$    TOTAL KNEE ARTHROPLASTY Bilateral right 1995;  left 1997   TRANSURETHRAL RESECTION OF PROSTATE  06-29-2002   dr humphries  '@[facility]'$    TRANSURETHRAL RESECTION OF PROSTATE N/A 07/06/2018   Procedure: TRANSURETHRAL RESECTION OF THE PROSTATE (TURP);  Surgeon: Franchot Gallo, MD;  Location: [facility];  Service: Urology;  Laterality: N/A;  71 MINS       Family History  Problem Relation Age of Onset   Heart attack Father    Lung disease Mother    Arrhythmia Sister    Atrial fibrillation Brother    Diabetes Neg Hx     Social History   Tobacco Use   Smoking status: Former    Packs/day: 1.00    Years: 30.00    Pack years: 30.00    Types: Cigarettes    Quit date: 05/06/1978    Years since  quitting: 42.6   Smokeless tobacco: Never  Vaping Use   Vaping Use: Never used  Substance Use Topics   Alcohol use: Yes    Comment: occasionally wine and beer   Drug use: Never    Home Medications Prior to Admission medications   Medication Sig Start Date End Date Taking? Authorizing Provider  acetaminophen (TYLENOL) 500 MG tablet Take 1,000-1,500 mg by mouth 2 (two) times daily as needed for moderate pain.     [provider]  amLODipine (NORVASC) 5 MG tablet Take 1 tablet (5 mg total) by mouth daily. 09/26/20   Copland, Gay Filler, MD  atorvastatin (LIPITOR) 10 MG tablet TAKE 1 TABLET BY MOUTH EVERY DAY 11/07/20   Copland, Janett Billow  C, MD  Carboxymethylcellulose Sodium (THERATEARS OP) Apply 1 drop to eye daily as needed (dry eyes).    [provider]  Cholecalciferol (VITAMIN D3) 5000 UNITS TABS Take 5,000 Units by mouth See admin instructions. Every 4 days    [provider]  clindamycin (CLEOCIN T) 1 % external solution Apply 1 application topically daily as needed (for bumps in scalp). Apply to scalp 06/23/14   [provider]  Cranberry 1000 MG CAPS Take 2,000-3,000 mg by mouth See admin instructions. Take 20000 mg in the morning and 3000 mg in the evening    [provider]  Docusate Calcium (STOOL SOFTENER PO) Take 2 tablets by mouth at bedtime.     [provider]  finasteride (PROSCAR) 5 MG tablet Take 5 mg by mouth every morning.  03/11/18   [provider]  HUMIRA PEN 40 MG/0.4ML PNKT Inject 40 mg as directed every 7 (seven) days. Every Tuesday 03/26/18   [provider]  lisinopril (ZESTRIL) 40 MG tablet Take 1 tablet (40 mg total) by mouth daily. 07/14/20   Copland, Gay Filler, MD  metoprolol succinate (TOPROL-XL) 50 MG 24 hr tablet TAKE 1/2 TABLET IN THE MORNING AND TAKE 1 TABLET IN THE EVENING 09/05/20   Allred, Jeneen Rinks, MD  Multiple Vitamins-Minerals (PRESERVISION AREDS 2) CAPS Take 1 capsule by mouth 2 (two) times a  day.    [provider]  Omega-3 Fatty Acids (OMEGA 3 PO) Take 3 capsules by mouth every evening.    [provider]  omeprazole (PRILOSEC) 20 MG capsule Take 20 mg by mouth daily.    Darlin Coco, MD  solifenacin (VESICARE) 10 MG tablet Take 10 mg by mouth at bedtime.     [provider]  tamsulosin (FLOMAX) 0.4 MG CAPS capsule Take 1 capsule (0.4 mg total) by mouth daily after supper. 12/25/18   Tyler Pita, MD  vitamin B-12 (CYANOCOBALAMIN) 500 MCG tablet Take 500 mcg by mouth every evening.     [provider]  XARELTO 20 MG TABS tablet TAKE 1 TABLET BY MOUTH EVERY DAY IN THE EVENING 11/16/20   [name], MD    Allergies    Penicillins  Review of Systems   Review of Systems  Constitutional:  Negative for chills and fever.  HENT:  Negative for ear pain and sore throat.   Eyes:  Negative for pain and visual disturbance.  Respiratory:  Negative for cough and shortness of breath.   Cardiovascular:  Positive for chest pain. Negative for palpitations.  Gastrointestinal:  Negative for abdominal pain, nausea and vomiting.  Genitourinary:  Negative for dysuria and hematuria.  Musculoskeletal:  Negative for arthralgias and back pain.  Skin:  Negative for color change and rash.  Neurological:  Negative for seizures and syncope.  All other systems reviewed and are negative.  Physical Exam Updated Vital Signs BP (!) 146/84 (BP Location: Right Arm)   Pulse 73   Temp 97.8 F (36.6 C) (Oral)   Resp 18   Ht 6' (1.829 m)   Wt 101.6 kg   SpO2 95%   BMI 30.38 kg/m   Physical Exam Vitals and nursing note reviewed.  Constitutional:      Appearance: Normal appearance.  HENT:     Head: Normocephalic and atraumatic.  Cardiovascular:     Rate and Rhythm: Normal rate and regular rhythm.     Heart sounds: Normal heart sounds.  Pulmonary:     Effort: Pulmonary effort is normal.  Breath sounds: Normal breath sounds.  Chest:        Comments: Slight bruise at the left anterior chest with overlying tenderness but no crepitus Musculoskeletal:     Cervical back: Normal range of motion.     Right lower leg: No edema.     Left lower leg: No edema.  Skin:    General: Skin is warm and dry.  Neurological:     General: No focal deficit present.     Mental Status: He is alert and oriented to person, place, and time.  Psychiatric:        Mood and Affect: Mood normal.        Behavior: Behavior normal.    ED Results / Procedures / Treatments   Labs (all labs ordered are listed, but only abnormal results are displayed) Labs Reviewed - No data to display  EKG None  Radiology DG Ribs Unilateral W/Chest Left  Result Date: 12/15/2020 CLINICAL DATA:  Fall on Wednesday EXAM: LEFT RIBS AND CHEST - 3+ VIEW COMPARISON:  Chest radiograph 11/26/2012 FINDINGS: The heart is at the upper limits of normal for size. The mediastinal contours are within normal limits. Patchy opacities in the left base likely reflect subsegmental atelectasis. There is no focal consolidation or pulmonary edema. There is no pleural effusion or pneumothorax. There is a subacute appearing fracture of the left eleventh rib. This does not correlate with the site of the BB placement. IMPRESSION: Subacute appearing fracture of the left eleventh rib, not at the site of BB placement. Correlate with point tenderness. No definite acute rib fracture identified. Electronically Signed   By: Valetta Mole MD   On: 12/15/2020 10:46    Procedures Procedures   Medications Ordered in ED Medications - No data to display  ED Course  I have reviewed the triage vital signs and the nursing notes.  Pertinent labs & imaging results that were available during my care of the patient were reviewed by me and considered in my medical decision making (see chart for details).    MDM Rules/Calculators/A&P                           [name] presents after a fall.  He sustained  a bruise to his left anterior chest.  No obvious fracture over the site of pain. Subacute 11th rib fracture does not correlate with his symptoms.  Vital signs are normal.  Exam does not suggest occult fracture, pulmonary contusion, cardiac contusion.  He was advised on symptomatic management and given return precautions. Final Clinical Impression(s) / ED Diagnoses Final diagnoses:  Contusion of left chest wall, initial encounter    Rx / DC Orders ED Discharge Orders     None        Arnaldo Natal, MD 12/15/20 1106

## 2020-12-20 ENCOUNTER — Ambulatory Visit: Payer: Medicare PPO | Admitting: Internal Medicine

## 2020-12-20 ENCOUNTER — Other Ambulatory Visit: Payer: Self-pay

## 2020-12-20 ENCOUNTER — Encounter: Payer: Self-pay | Admitting: Internal Medicine

## 2020-12-20 VITALS — BP 126/70 | HR 74 | Temp 98.0°F | Resp 16 | Ht 68.0 in | Wt 221.2 lb

## 2020-12-20 DIAGNOSIS — S20212D Contusion of left front wall of thorax, subsequent encounter: Secondary | ICD-10-CM | POA: Diagnosis not present

## 2020-12-20 MED ORDER — METHOCARBAMOL 500 MG PO TABS: 500.0000 mg | ORAL_TABLET | Freq: Every evening | ORAL | 0 refills | Status: DC | PRN

## 2020-12-20 NOTE — Patient Instructions (Signed)
Continue taking Tylenol as needed  Okay to take Robaxin, a muscle relaxant, at bedtime.  It may cause some drowsiness, be careful.  If you are not gradually better in the next few days let us know  Do not take ibuprofen, naproxen or any other anti-inflammatory

## 2020-12-20 NOTE — Progress Notes (Signed)
Subjective:    Patient ID: Connor [name], male    DOB: 02/22/1934, 87 y.o.   MRN: [id number]  DOS:  12/20/2020 Type of visit - description: ER follow-up  Went to the ER 12/15/2020, 3-day prior, had a accidental fall while sitting on a chair, landed on his left side, injured his chest wall. He denies any other injuries such as head or neck. At the x-ray, there was a fracture on the 11th left rib but did not correlate with the area of pain. He is here for a checkup. Pain is worse when he leans forward, takes a deep breath or turns in bed. Currently more comfortable sleeping on a recliner. On Tylenol only.  Review of Systems Denies fever chills No URI type of symptoms No nausea or vomiting  Past Medical History:  Diagnosis Date   Benign localized prostatic hyperplasia with lower urinary tract symptoms (LUTS)    urologist-- dr Diona Fanti   Chronic anticoagulation    Xarelto for afib   Chronic constipation    Chronic cystitis    Gross hematuria    History of DVT of lower extremity 06/08/2010   right femoral dvt dx post op total hip 05-08-2010   History of gastric ulcer    remote   History of kidney stones    one stone. remote hx   Hyperlipidemia    Hypertension    Dx by Dr. Lance Muss around age  16    NICM (nonischemic cardiomyopathy) ([place])    per echo 06-11-2017,  ef 45-50% with diffuse hypokinesis,  G1DD   OSA on CPAP 2017   compliant with CPAP.  managed by Dr Elsworth Soho.   Osteoarthritis    Other urethral stricture, male, meatal    chronic;  pt self dilates   Paroxysmal atrial fibrillation [facility]) cardiologist-- dr Rayann Heman   first dx 2009/   a. documented by Dr Barrett Shell on office EKG 9/12. b. maintained on Tikosyn and Xarelto. c. s/p DCCV 's.  d.  x3  EP w/ ablation atrial fib last one 12-05-2016   Prediabetes    Premature ventricular contraction    PVC's (premature ventricular contractions)    RA (rheumatoid arthritis) ([place])    rheumatologist--- Dr Page Spiro,  treated  with Humira   RBBB (right bundle branch block)    Hx of a fib, ablation Aug 2018   Rheumatoid arthritis ([place])    Vitamin D deficiency    Wears hearing aid in both ears     Past Surgical History:  Procedure Laterality Date   ABLATION OF DYSRHYTHMIC FOCUS  08/29/2015   ATRIAL FIBRILLATION ABLATION  12/05/2016   ATRIAL FIBRILLATION ABLATION N/A 12/05/2016   Procedure: Atrial Fibrillation Ablation;  Surgeon: [name], MD;  Location: [facility] CV LAB;  Service: Cardiovascular;  Laterality: N/A;   ATRIAL FIBRILLATION ABLATION N/A 02/29/2020   Procedure: ATRIAL FIBRILLATION ABLATION;  Surgeon: [name], MD;  Location: [place] CV LAB;  Service: Cardiovascular;  Laterality: N/A;   ATRIAL FLUTTER ABLATION  09/2010   by Glenford Bayley Right 1990s   CARDIOVERSION N/A 10/28/2012   Procedure: CARDIOVERSION;  Surgeon: Burnell Blanks, MD;  Location: [facility];  Service: Cardiovascular;  Laterality: N/A;   CATARACT EXTRACTION W/ INTRAOCULAR LENS  IMPLANT, BILATERAL  2019   CYSTOSCOPY WITH BIOPSY N/A 09/03/2018   Procedure: CYSTOSCOPY WITH FULGURATION Fran Lowes;  Surgeon: Franchot Gallo, MD;  [facility];  Service: Urology;  Laterality: N/A;  [place]  STUDY N/A 08/29/2015   Procedure: Atrial Fibrillation Ablation;  Surgeon: [name], MD;  Location: [place] CV LAB;  Service: Cardiovascular;  Laterality: N/A;   KNEE SURGERY Bilateral x3   1960s & 1970s   open   LEFT HEART CATHETERIZATION WITH CORONARY ANGIOGRAM N/A 06/07/2011   Procedure: LEFT HEART CATHETERIZATION WITH CORONARY ANGIOGRAM;  Surgeon: Peter M Martinique, MD;  Location: [facility] CATH LAB;  Service: Cardiovascular;  Laterality: N/A;    Normal coronary arteries,  low normal LVF   TONSILLECTOMY AND ADENOIDECTOMY  age 5   TOTAL HIP ARTHROPLASTY Right 06/04/10    dr Noemi Chapel  '@[facility]'$    TOTAL KNEE ARTHROPLASTY Bilateral right 1995;  left 1997   TRANSURETHRAL RESECTION OF PROSTATE  06-29-2002   dr humphries   '@[facility]'$    TRANSURETHRAL RESECTION OF PROSTATE N/A 07/06/2018   Procedure: TRANSURETHRAL RESECTION OF THE PROSTATE (TURP);  Surgeon: Franchot Gallo, MD;  Location: [facility];  Service: Urology;  Laterality: N/A;  45 MINS    Allergies as of 12/20/2020       Reactions   Penicillins Itching, Swelling, Other (See Comments)   Did it involve swelling of the face/tongue/throat, SOB, or low BP? No Did it involve sudden or severe rash/hives, skin peeling, or any reaction on the inside of your mouth or nose? No Did you need to seek medical attention at a hospital or doctor's office? No When did it last happen?      30 + years If all above answers are "NO", may proceed with cephalosporin use.        Medication List        Accurate as of December 20, 2020 11:59 PM. If you have any questions, ask your nurse or doctor.          acetaminophen 500 MG tablet Commonly known as: TYLENOL Take 1,000-1,500 mg by mouth 2 (two) times daily as needed for moderate pain.   amLODipine 5 MG tablet Commonly known as: NORVASC Take 1 tablet (5 mg total) by mouth daily.   atorvastatin 10 MG tablet Commonly known as: LIPITOR TAKE 1 TABLET BY MOUTH EVERY DAY   clindamycin 1 % external solution Commonly known as: CLEOCIN T Apply 1 application topically daily as needed (for bumps in scalp). Apply to scalp   Cranberry 1000 MG Caps Take 2,000-3,000 mg by mouth See admin instructions. Take 20000 mg in the morning and 3000 mg in the evening   finasteride 5 MG tablet Commonly known as: PROSCAR Take 5 mg by mouth every morning.   Humira Pen 40 MG/0.4ML Pnkt Generic drug: Adalimumab Inject 40 mg as directed every 7 (seven) days. Every Tuesday   lisinopril 40 MG tablet Commonly known as: ZESTRIL Take 1 tablet (40 mg total) by mouth daily.   methocarbamol 500 MG tablet Commonly known as: ROBAXIN Take 1 tablet (500 mg total) by mouth at bedtime as needed for muscle spasms. Started by: Kathlene November, MD   metoprolol succinate 50 MG 24 hr tablet Commonly known as: TOPROL-XL TAKE 1/2 TABLET IN THE MORNING AND TAKE 1 TABLET IN THE EVENING   OMEGA 3 PO Take 3 capsules by mouth every evening.   omeprazole 20 MG capsule Commonly known as: PRILOSEC Take 20 mg by mouth daily.   PreserVision AREDS 2 Caps Take 1 capsule by mouth 2 (two) times a day.   solifenacin 10 MG tablet Commonly known as: VESICARE Take 10 mg by mouth at bedtime.   STOOL SOFTENER PO Take  2 tablets by mouth at bedtime.   tamsulosin 0.4 MG Caps capsule Commonly known as: Flomax Take 1 capsule (0.4 mg total) by mouth daily after supper.   THERATEARS OP Apply 1 drop to eye daily as needed (dry eyes).   vitamin B-12 500 MCG tablet Commonly known as: CYANOCOBALAMIN Take 500 mcg by mouth every evening.   Vitamin D3 125 MCG (5000 UT) Tabs Take 5,000 Units by mouth See admin instructions. Every 4 days   Xarelto 20 MG Tabs tablet Generic drug: rivaroxaban TAKE 1 TABLET BY MOUTH EVERY DAY IN THE EVENING           Objective:   Physical Exam Chest:       Comments: Area of perceived pain is at the anterior left chest, area is TTP, no crepitus. He has a small ecchymosis there.  BP 126/70 (BP Location: Left Arm, Patient Position: Sitting, Cuff Size: Normal)   Pulse 74   Temp 98 F (36.7 C) (Oral)   Resp 16   Ht '5\' 8"'$  (1.727 m)   Wt 221 lb 4 oz (100.4 kg)   SpO2 93%   BMI 33.64 kg/m  General:   Well developed, NAD, BMI noted. HEENT:  Normocephalic . Face symmetric, atraumatic Lungs:  CTA B Normal respiratory effort, no intercostal retractions, no accessory muscle use. Heart: RRR,  no murmur.  Lower extremities: no pretibial edema bilaterally  Skin: Not pale. Not jaundice Neurologic:  alert & oriented X3.  Speech normal, gait appropriate for age and unassisted Psych--  Cognition and judgment appear intact.  Cooperative with normal attention span and concentration.  Behavior  appropriate. No anxious or depressed appearing.      Assessment      87 year old male, history of HTN, high cholesterol, A. fib, anticoagulated, OSA, GERD, history of prostate cancer, cerebral negative rheumatoid Tritus, DJD, presents for ER f/u:  Chest contusion: Developed pain at the left anterior chest after a fall.  X-rays done at the ER showed a subacute fracture but did not correlate with the site of pain consequently he has a chest contusion. He also experiencing some chest wall spasms associated with injury. Plan: Continue Tylenol, avoid NSAIDs, trial with Robaxin for spasms, he is aware of risk of somnolence and falls. Call if not gradually better    This visit occurred during the SARS-CoV-2 public health emergency.  Safety protocols were in place, including screening questions prior to the visit, additional usage of staff PPE, and extensive cleaning of exam room while observing appropriate contact time as indicated for disinfecting solutions.

## 2020-12-21 ENCOUNTER — Encounter (INDEPENDENT_AMBULATORY_CARE_PROVIDER_SITE_OTHER): Payer: Medicare PPO | Admitting: Ophthalmology

## 2020-12-21 DIAGNOSIS — I10 Essential (primary) hypertension: Secondary | ICD-10-CM | POA: Diagnosis not present

## 2020-12-21 DIAGNOSIS — H35033 Hypertensive retinopathy, bilateral: Secondary | ICD-10-CM

## 2020-12-21 DIAGNOSIS — H353132 Nonexudative age-related macular degeneration, bilateral, intermediate dry stage: Secondary | ICD-10-CM | POA: Diagnosis not present

## 2020-12-21 DIAGNOSIS — H43813 Vitreous degeneration, bilateral: Secondary | ICD-10-CM

## 2020-12-22 ENCOUNTER — Encounter (INDEPENDENT_AMBULATORY_CARE_PROVIDER_SITE_OTHER): Payer: Medicare PPO | Admitting: Ophthalmology

## 2020-12-28 DIAGNOSIS — H353132 Nonexudative age-related macular degeneration, bilateral, intermediate dry stage: Secondary | ICD-10-CM | POA: Diagnosis not present

## 2021-01-09 DIAGNOSIS — C61 Malignant neoplasm of prostate: Secondary | ICD-10-CM | POA: Diagnosis not present

## 2021-01-12 DIAGNOSIS — L821 Other seborrheic keratosis: Secondary | ICD-10-CM | POA: Diagnosis not present

## 2021-01-12 DIAGNOSIS — D1801 Hemangioma of skin and subcutaneous tissue: Secondary | ICD-10-CM | POA: Diagnosis not present

## 2021-01-12 DIAGNOSIS — L570 Actinic keratosis: Secondary | ICD-10-CM | POA: Diagnosis not present

## 2021-01-12 DIAGNOSIS — L814 Other melanin hyperpigmentation: Secondary | ICD-10-CM | POA: Diagnosis not present

## 2021-01-12 DIAGNOSIS — D3617 Benign neoplasm of peripheral nerves and autonomic nervous system of trunk, unspecified: Secondary | ICD-10-CM | POA: Diagnosis not present

## 2021-01-15 DIAGNOSIS — N401 Enlarged prostate with lower urinary tract symptoms: Secondary | ICD-10-CM | POA: Diagnosis not present

## 2021-01-15 DIAGNOSIS — R350 Frequency of micturition: Secondary | ICD-10-CM | POA: Diagnosis not present

## 2021-01-15 DIAGNOSIS — Z8546 Personal history of malignant neoplasm of prostate: Secondary | ICD-10-CM | POA: Diagnosis not present

## 2021-01-18 ENCOUNTER — Encounter (INDEPENDENT_AMBULATORY_CARE_PROVIDER_SITE_OTHER): Payer: Medicare PPO | Admitting: Ophthalmology

## 2021-01-18 ENCOUNTER — Encounter (INDEPENDENT_AMBULATORY_CARE_PROVIDER_SITE_OTHER): Payer: Self-pay

## 2021-01-27 DIAGNOSIS — H353132 Nonexudative age-related macular degeneration, bilateral, intermediate dry stage: Secondary | ICD-10-CM | POA: Diagnosis not present

## 2021-02-08 DIAGNOSIS — H903 Sensorineural hearing loss, bilateral: Secondary | ICD-10-CM | POA: Diagnosis not present

## 2021-02-26 DIAGNOSIS — H353132 Nonexudative age-related macular degeneration, bilateral, intermediate dry stage: Secondary | ICD-10-CM | POA: Diagnosis not present

## 2021-03-01 DIAGNOSIS — B351 Tinea unguium: Secondary | ICD-10-CM | POA: Diagnosis not present

## 2021-03-01 DIAGNOSIS — I739 Peripheral vascular disease, unspecified: Secondary | ICD-10-CM | POA: Diagnosis not present

## 2021-03-01 DIAGNOSIS — L603 Nail dystrophy: Secondary | ICD-10-CM | POA: Diagnosis not present

## 2021-03-10 NOTE — Progress Notes (Addendum)
[facility] [address] [place], [place] [address] [phone]  Date:  03/12/2021   Name:  Connor [name]   DOB:  03/08/1934   MRN:  [id number]  PCP:  Darreld Mclean, MD    Chief Complaint: No chief complaint on file.   History of Present Illness:  Connor [name] is a 87 y.o. very pleasant male patient who presents with the following:  Pt seen today for a periodic follow-up visit Last seen by myself in May- at that time his [name] had been dx with gallbladder cancer  Connor [name] reports Connor [name] is doing chemo which is hard- she is having some SE.  She is actually being seen today as well by RN for her Medicare wellness visit  History of atrial fibrillation, left ventricle dysfunction, prostate cancer, seronegative rheumatoid arthritis and osteoarthritis He does IM humira weekly   Cardiologist is Dr Rayann Heman  He wonders about his BP perhaps being too low- his SBP running in the 60s.  He wonders if he can stop the amlodipine.  He also does have LE edema- this goes away overnight and builds up when upright during the day No SOB or orthopnea He is using his CPAP some but he does not like it!  May use about 1 hour per night Echo a year ago:  1. Left ventricular ejection fraction, by estimation, is 50 to 55%. The  left ventricle has low normal function. The left ventricle has no regional  wall motion abnormalities. The left ventricular internal cavity size was  mildly dilated. Left ventricular  diastolic parameters are consistent with Grade I diastolic dysfunction  (impaired relaxation).  Most recent ablation about a year ago- he has staying in [place] since He has an a fib clinic appt this week  He does note that his energy feels low. He gets tired really easily during exercise   Recent TSH and B12 normal He notes fatigue is present with physical exercise- he is not otherwise sleepy or somnolent No CP or  tightness noted -he just gets tired more quickly He keeps track of his BP- last 90 day average 122/68  Wt Readings from Last 3 Encounters:  03/12/21 223 lb (101.2 kg)  12/20/20 221 lb 4 oz (100.4 kg)  12/15/20 224 lb (101.6 kg)    Sees urology for prostate cancer - last visit in September   Covid updated booster - 01/13/21 Flu shot - 01/13/21  Labs done in May- CBC and BMP, TSH Patient Active Problem List   Diagnosis Date Noted   Secondary hypercoagulable state ([place]) 03/28/2020   Malignant neoplasm of prostate ([place]) 09/11/2018   Enlarged prostate with urinary obstruction 07/06/2018   Gastrocnemius strain, right, initial encounter 12/10/2017   Baker's cyst of knee, right 12/10/2017   Obesity 03/24/2017   Paroxysmal atrial fibrillation ([place]) 12/05/2016   Near syncope 04/20/2016   Leg hematoma, left, initial encounter 04/20/2016   Bleeding    Ecchymosis    Left hamstring muscle strain    Muscle tear    RBBB 92/42/6834   Chronic systolic dysfunction of left ventricle 07/11/2014   OSA (obstructive sleep apnea) 04/07/2013   Multinodular goiter 01/20/2013   Cervical spondylosis without myelopathy 01/18/2013   Atrial fibrillation with RVR ([place]) 10/27/2012   Left ventricular dysfunction 12/17/2011   Low back pain 09/11/2011   Fatigue 05/22/2011   PVC (premature ventricular contraction) 01/18/2011   Persistent atrial fibrillation (  [place]) 01/12/2011   Hyperlipidemia 08/02/2010   Hypertension 08/02/2010   Osteoarthritis 08/02/2010   BPH (benign prostatic hyperplasia) 08/02/2010   GERD (gastroesophageal reflux disease) 08/02/2010   h/o Atrial flutter ([place]) s/p ablation 2012     Past Medical History:  Diagnosis Date   Benign localized prostatic hyperplasia with lower urinary tract symptoms (LUTS)    urologist-- dr Diona Fanti   Chronic anticoagulation    Xarelto for afib   Chronic constipation    Chronic cystitis    Gross hematuria    History of DVT of lower extremity 06/08/2010    right femoral dvt dx post op total hip 05-08-2010   History of gastric ulcer    remote   History of kidney stones    one stone. remote hx   Hyperlipidemia    Hypertension    Dx by Dr. Lance Muss around age  35    NICM (nonischemic cardiomyopathy) ([place])    per echo 06-11-2017,  ef 45-50% with diffuse hypokinesis,  G1DD   OSA on CPAP 2017   compliant with CPAP.  managed by Dr Elsworth Soho.   Osteoarthritis    Other urethral stricture, male, meatal    chronic;  pt self dilates   Paroxysmal atrial fibrillation [facility]) cardiologist-- dr Rayann Heman   first dx 2009/   a. documented by Dr Barrett Shell on office EKG 9/12. b. maintained on Tikosyn and Xarelto. c. s/p DCCV 's.  d.  x3  EP w/ ablation atrial fib last one 12-05-2016   Prediabetes    Premature ventricular contraction    PVC's (premature ventricular contractions)    RA (rheumatoid arthritis) ([place])    rheumatologist--- Dr Page Spiro,  treated with Humira   RBBB (right bundle branch block)    Hx of a fib, ablation Aug 2018   Rheumatoid arthritis ([place])    Vitamin D deficiency    Wears hearing aid in both ears     Past Surgical History:  Procedure Laterality Date   ABLATION OF DYSRHYTHMIC FOCUS  08/29/2015   ATRIAL FIBRILLATION ABLATION  12/05/2016   ATRIAL FIBRILLATION ABLATION N/A 12/05/2016   Procedure: Atrial Fibrillation Ablation;  Surgeon: [name], MD;  Location: [facility];  Service: Cardiovascular;  Laterality: N/A;   ATRIAL FIBRILLATION ABLATION N/A 02/29/2020   Procedure: ATRIAL FIBRILLATION ABLATION;  Surgeon: [name], MD;  Location: [facility];  Service: Cardiovascular;  Laterality: N/A;   ATRIAL FLUTTER ABLATION  09/2010   by [place] Bayley Right 1990s   CARDIOVERSION N/A 10/28/2012   Procedure: CARDIOVERSION;  Surgeon: Burnell Blanks, MD;  Location: [facility];  Service: Cardiovascular;  Laterality: N/A;   CATARACT EXTRACTION W/ INTRAOCULAR LENS  IMPLANT, BILATERAL  2019   CYSTOSCOPY WITH BIOPSY N/A  09/03/2018   Procedure: CYSTOSCOPY WITH FULGURATION Fran Lowes;  Surgeon: Franchot Gallo, MD;  Location: [facility];  Service: Urology;  Laterality: N/A;  30 MINS   ELECTROPHYSIOLOGIC STUDY N/A 08/29/2015   Procedure: Atrial Fibrillation Ablation;  Surgeon: [name], MD;  Location: [facility];  Service: Cardiovascular;  Laterality: N/A;   KNEE SURGERY Bilateral x3   1960s & 1970s   open   LEFT HEART CATHETERIZATION WITH CORONARY ANGIOGRAM N/A 06/07/2011   Procedure: LEFT HEART CATHETERIZATION WITH CORONARY ANGIOGRAM;  Surgeon: Peter M Martinique, MD;  Location: [facility];  Service: Cardiovascular;  Laterality: N/A;    Normal coronary arteries,  low normal LVF   TONSILLECTOMY AND ADENOIDECTOMY  age 71   TOTAL  HIP ARTHROPLASTY Right 06/04/10    dr Noemi Chapel  @[facility]    TOTAL KNEE ARTHROPLASTY Bilateral right 1995;  left [facility]  06-29-2002   dr humphries  @[facility]    TRANSURETHRAL RESECTION OF PROSTATE N/A 07/06/2018   Procedure: TRANSURETHRAL RESECTION OF THE PROSTATE (TURP);  Surgeon: Franchot Gallo, MD;  Location: [facility];  Service: Urology;  Laterality: N/A;  62 MINS    Social History   Tobacco Use   Smoking status: Former    Packs/day: 1.00    Years: 30.00    Pack years: 30.00    Types: Cigarettes    Quit date: 05/06/1978    Years since quitting: 42.8   Smokeless tobacco: Never  Vaping Use   Vaping Use: Never used  Substance Use Topics   Alcohol use: Yes    Comment: occasionally wine and beer   Drug use: Never    Family History  Problem Relation Age of Onset   Heart attack Father    Lung disease Mother    Arrhythmia Sister    Atrial fibrillation Brother    Diabetes Neg Hx     Allergies  Allergen Reactions   Penicillins Itching, Swelling and Other (See Comments)    Did it involve swelling of the face/tongue/throat, SOB, or low BP? No Did it involve sudden or severe rash/hives, skin peeling, or any reaction on the inside of  your mouth or nose? No Did you need to seek medical attention at a hospital or doctor's office? No When did it last happen?      30 + years If all above answers are "NO", may proceed with cephalosporin use.      Medication list has been reviewed and updated.  Current Outpatient Medications on File Prior to Visit  Medication Sig Dispense Refill   acetaminophen (TYLENOL) 500 MG tablet Take 1,000-1,500 mg by mouth 2 (two) times daily as needed for moderate pain.      amLODipine (NORVASC) 5 MG tablet Take 1 tablet (5 mg total) by mouth daily. 90 tablet 2   atorvastatin (LIPITOR) 10 MG tablet TAKE 1 TABLET BY MOUTH EVERY DAY 90 tablet 3   Carboxymethylcellulose Sodium (THERATEARS OP) Apply 1 drop to eye daily as needed (dry eyes).     Cholecalciferol (VITAMIN D3) 5000 UNITS TABS Take 5,000 Units by mouth See admin instructions. Every 4 days     clindamycin (CLEOCIN T) 1 % external solution Apply 1 application topically daily as needed (for bumps in scalp). Apply to scalp  2   Cranberry 1000 MG CAPS Take 2,000-3,000 mg by mouth See admin instructions. Take 20000 mg in the morning and 3000 mg in the evening     Docusate Calcium (STOOL SOFTENER PO) Take 2 tablets by mouth at bedtime.      finasteride (PROSCAR) 5 MG tablet Take 5 mg by mouth every morning.   3   HUMIRA PEN 40 MG/0.4ML PNKT Inject 40 mg as directed every 7 (seven) days. Every Tuesday  6   lisinopril (ZESTRIL) 40 MG tablet Take 1 tablet (40 mg total) by mouth daily. 90 tablet 3   methocarbamol (ROBAXIN) 500 MG tablet Take 1 tablet (500 mg total) by mouth at bedtime as needed for muscle spasms. 21 tablet 0   metoprolol succinate (TOPROL-XL) 50 MG 24 hr tablet TAKE 1/2 TABLET IN THE MORNING AND TAKE 1 TABLET IN THE EVENING 135 tablet 3   Multiple Vitamins-Minerals (PRESERVISION AREDS 2) CAPS Take 1  capsule by mouth 2 (two) times a day.     Omega-3 Fatty Acids (OMEGA 3 PO) Take 3 capsules by mouth every evening.     omeprazole  (PRILOSEC) 20 MG capsule Take 20 mg by mouth daily.     solifenacin (VESICARE) 10 MG tablet Take 10 mg by mouth at bedtime.      vitamin B-12 (CYANOCOBALAMIN) 500 MCG tablet Take 500 mcg by mouth every evening.      XARELTO 20 MG TABS tablet TAKE 1 TABLET BY MOUTH EVERY DAY IN THE EVENING 90 tablet 1   tamsulosin (FLOMAX) 0.4 MG CAPS capsule Take 1 capsule (0.4 mg total) by mouth daily after supper. (Patient not taking: Reported on 03/12/2021) 30 capsule 5   No current facility-administered medications on file prior to visit.    Review of Systems:  As per HPI- otherwise negative.   Physical Examination: Vitals:   03/12/21 0933  BP: 114/73  Pulse: 72  Resp: 16  Temp: 97.8 F (36.6 C)  SpO2: 98%   Vitals:   03/12/21 0933  Weight: 223 lb (101.2 kg)  Height: 5\' 8"  (1.727 m)   Body mass index is 33.91 kg/m. Ideal Body Weight: Weight in (lb) to have BMI = 25: 164.1  GEN: no acute distress.  Overweight, looks well HEENT: Atraumatic, Normocephalic.  Hearing aids bilaterally, PEERL Ears and Nose: No external deformity. CV: RRR, No M/G/R. No JVD. No thrill. No extra heart sounds. PULM: CTA B, no wheezes, crackles, rhonchi. No retractions. No resp. distress. No accessory muscle use. ABD: S, NT, ND, +BS. No rebound. No HSM. EXTR: No c/c/there is trace to 1+ edema present in bilateral ankles Patient states he had a foot exam by podiatry last week PSYCH: Normally interactive. Conversant.    Assessment and Plan: Essential hypertension - Plan: Comprehensive metabolic panel, CBC  Bilateral hearing loss, unspecified hearing loss type  Mixed hyperlipidemia - Plan: Lipid panel  Typical atrial flutter (HCC)  OSA (obstructive sleep apnea)  Lower extremity edema - Plan: B Nat Peptide  Muscular deconditioning - Plan: Ambulatory referral to Physical Therapy  Seen today for follow-up.  Blood pressures under good control, perhaps a bit low.  He is also having lower extremity edema.  We  will touch base with his cardiologist about potentially stopping amlodipine.  We will also check a BNP due to known history of mild heart failure Currently in sinus rhythm He is interested in working on endurance and balance with physical therapy, referral placed Will plan further follow- up pending labs.    Signed Lamar Blinks, MD  Received labs as below, message to patient  Results for orders placed or performed in visit on 03/12/21  Comprehensive metabolic panel  Result Value Ref Range   Sodium 143 135 - 145 mEq/L   Potassium 3.9 3.5 - 5.1 mEq/L   Chloride 106 96 - 112 mEq/L   CO2 31 19 - 32 mEq/L   Glucose, Bld 111 (H) 70 - 99 mg/dL   BUN 12 6 - 23 mg/dL   Creatinine, Ser 0.67 0.40 - 1.50 mg/dL   Total Bilirubin 0.7 0.2 - 1.2 mg/dL   Alkaline Phosphatase 78 39 - 117 U/L   AST 19 0 - 37 U/L   ALT 18 0 - 53 U/L   Total Protein 6.2 6.0 - 8.3 g/dL   Albumin 3.9 3.5 - 5.2 g/dL   GFR 85.43 >60.00 mL/min   Calcium 8.6 8.4 - 10.5 mg/dL  CBC  Result Value  Ref Range   WBC 4.2 4.0 - 10.5 K/uL   RBC 3.76 (L) 4.22 - 5.81 Mil/uL   Platelets 129.0 (L) 150.0 - 400.0 K/uL   Hemoglobin 13.1 13.0 - 17.0 g/dL   HCT 39.5 39.0 - 52.0 %   MCV 105.2 (H) 78.0 - 100.0 fl   MCHC 33.2 30.0 - 36.0 g/dL   RDW 13.8 11.5 - 15.5 %  Lipid panel  Result Value Ref Range   Cholesterol 156 0 - 200 mg/dL   Triglycerides 94.0 0.0 - 149.0 mg/dL   HDL 46.70 >39.00 mg/dL   VLDL 18.8 0.0 - 40.0 mg/dL   LDL Cholesterol 91 0 - 99 mg/dL   Total CHOL/HDL Ratio 3    NonHDL 109.55   B Nat Peptide  Result Value Ref Range   Pro B Natriuretic peptide (BNP) 59.0 0.0 - 100.0 pg/mL

## 2021-03-10 NOTE — Patient Instructions (Addendum)
Good to see you again today Please get the new covid booster if not done already- done!    Assuming all is well please see me in about 6 months  I will touch base with Dr Rayann Heman about coming off your amlodipine

## 2021-03-12 ENCOUNTER — Ambulatory Visit: Payer: Medicare PPO | Admitting: Family Medicine

## 2021-03-12 ENCOUNTER — Other Ambulatory Visit: Payer: Self-pay

## 2021-03-12 ENCOUNTER — Encounter: Payer: Self-pay | Admitting: Family Medicine

## 2021-03-12 VITALS — BP 114/73 | HR 72 | Temp 97.8°F | Resp 16 | Ht 68.0 in | Wt 223.0 lb

## 2021-03-12 DIAGNOSIS — R600 Localized edema: Secondary | ICD-10-CM | POA: Diagnosis not present

## 2021-03-12 DIAGNOSIS — I483 Typical atrial flutter: Secondary | ICD-10-CM

## 2021-03-12 DIAGNOSIS — H9193 Unspecified hearing loss, bilateral: Secondary | ICD-10-CM | POA: Diagnosis not present

## 2021-03-12 DIAGNOSIS — D7589 Other specified diseases of blood and blood-forming organs: Secondary | ICD-10-CM

## 2021-03-12 DIAGNOSIS — G4733 Obstructive sleep apnea (adult) (pediatric): Secondary | ICD-10-CM | POA: Diagnosis not present

## 2021-03-12 DIAGNOSIS — I10 Essential (primary) hypertension: Secondary | ICD-10-CM

## 2021-03-12 DIAGNOSIS — R29898 Other symptoms and signs involving the musculoskeletal system: Secondary | ICD-10-CM

## 2021-03-12 DIAGNOSIS — E782 Mixed hyperlipidemia: Secondary | ICD-10-CM | POA: Diagnosis not present

## 2021-03-12 LAB — COMPREHENSIVE METABOLIC PANEL
ALT: 18 U/L (ref 0–53)
AST: 19 U/L (ref 0–37)
Albumin: 3.9 g/dL (ref 3.5–5.2)
Alkaline Phosphatase: 78 U/L (ref 39–117)
BUN: 12 mg/dL (ref 6–23)
CO2: 31 mEq/L (ref 19–32)
Calcium: 8.6 mg/dL (ref 8.4–10.5)
Chloride: 106 mEq/L (ref 96–112)
Creatinine, Ser: 0.67 mg/dL (ref 0.40–1.50)
GFR: 85.43 mL/min (ref >60.00)
Glucose, Bld: 111 mg/dL — ABNORMAL HIGH (ref 70–99)
Potassium: 3.9 mEq/L (ref 3.5–5.1)
Sodium: 143 mEq/L (ref 135–145)
Total Bilirubin: 0.7 mg/dL (ref 0.2–1.2)
Total Protein: 6.2 g/dL (ref 6.0–8.3)

## 2021-03-12 LAB — LIPID PANEL
Cholesterol: 156 mg/dL (ref 0–200)
HDL: 46.7 mg/dL (ref >39.00)
LDL Cholesterol: 91 mg/dL (ref 0–99)
NonHDL: 109.55
Total CHOL/HDL Ratio: 3
Triglycerides: 94 mg/dL (ref 0.0–149.0)
VLDL: 18.8 mg/dL (ref 0.0–40.0)

## 2021-03-12 LAB — CBC
HCT: 39.5 % (ref 39.0–52.0)
Hemoglobin: 13.1 g/dL (ref 13.0–17.0)
MCHC: 33.2 g/dL (ref 30.0–36.0)
MCV: 105.2 fl — ABNORMAL HIGH (ref 78.0–100.0)
Platelets: 129 10*3/uL — ABNORMAL LOW (ref 150.0–400.0)
RBC: 3.76 Mil/uL — ABNORMAL LOW (ref 4.22–5.81)
RDW: 13.8 % (ref 11.5–15.5)
WBC: 4.2 10*3/uL (ref 4.0–10.5)

## 2021-03-12 LAB — BRAIN NATRIURETIC PEPTIDE: Pro B Natriuretic peptide (BNP): 59 pg/mL (ref 0.0–100.0)

## 2021-03-14 ENCOUNTER — Other Ambulatory Visit: Payer: Self-pay

## 2021-03-14 ENCOUNTER — Ambulatory Visit (HOSPITAL_COMMUNITY)
Admission: RE | Admit: 2021-03-14 | Discharge: 2021-03-14 | Disposition: A | Discharge status: Home / Self Care | Payer: Medicare PPO | Source: Ambulatory Visit | Attending: Physician Assistant | Admitting: Physician Assistant

## 2021-03-14 ENCOUNTER — Encounter (HOSPITAL_COMMUNITY): Payer: Self-pay | Admitting: Physician Assistant

## 2021-03-14 VITALS — BP 130/88 | HR 63 | Ht 68.0 in | Wt 222.6 lb

## 2021-03-14 DIAGNOSIS — Z8249 Family history of ischemic heart disease and other diseases of the circulatory system: Secondary | ICD-10-CM | POA: Diagnosis not present

## 2021-03-14 DIAGNOSIS — Z87891 Personal history of nicotine dependence: Secondary | ICD-10-CM | POA: Insufficient documentation

## 2021-03-14 DIAGNOSIS — Z713 Dietary counseling and surveillance: Secondary | ICD-10-CM | POA: Diagnosis not present

## 2021-03-14 DIAGNOSIS — Z6833 Body mass index (BMI) 33.0-33.9, adult: Secondary | ICD-10-CM | POA: Diagnosis not present

## 2021-03-14 DIAGNOSIS — D6869 Other thrombophilia: Secondary | ICD-10-CM | POA: Diagnosis not present

## 2021-03-14 DIAGNOSIS — I428 Other cardiomyopathies: Secondary | ICD-10-CM | POA: Insufficient documentation

## 2021-03-14 DIAGNOSIS — E669 Obesity, unspecified: Secondary | ICD-10-CM | POA: Insufficient documentation

## 2021-03-14 DIAGNOSIS — I10 Essential (primary) hypertension: Secondary | ICD-10-CM | POA: Insufficient documentation

## 2021-03-14 DIAGNOSIS — Z7901 Long term (current) use of anticoagulants: Secondary | ICD-10-CM | POA: Insufficient documentation

## 2021-03-14 DIAGNOSIS — I480 Paroxysmal atrial fibrillation: Secondary | ICD-10-CM | POA: Diagnosis not present

## 2021-03-14 DIAGNOSIS — G4733 Obstructive sleep apnea (adult) (pediatric): Secondary | ICD-10-CM | POA: Diagnosis not present

## 2021-03-14 NOTE — Progress Notes (Signed)
Primary Care Physician: Darreld Mclean, MD Primary Electrophysiologist: Dr Rayann Heman Referring Physician: Dr Nena Polio III is a 87 y.o. male with a history of HTN, HLD, NICM, OSA, and atrial fibrillation who presents for follow up in the [facility]. Patient is on Xarelto for a CHADS2VASC score of 4. Patient has had afib ablations in 2017 and 2018. His most recent ablation was on 02/29/20 with Dr Rayann Heman.   On follow up today, patient reports that he has done well since his last visit. He has not had any afib noted on his Kardia device. He denies any bleeding issues on anticoagulation.   Today, he denies symptoms of palpitations, chest pain, shortness of breath, orthopnea, PND, lower extremity edema, dizziness, presyncope, syncope, snoring, daytime somnolence, bleeding, or neurologic sequela. The patient is tolerating medications without difficulties and is otherwise without complaint today.    Atrial Fibrillation Risk Factors:  he does have symptoms or diagnosis of sleep apnea. he is compliant with CPAP therapy. he does not have a history of rheumatic fever. he does not have a history of alcohol use.   he has a BMI of Body mass index is 33.85 kg/m.Marland Kitchen Filed Weights   03/14/21 0958  Weight: 101 kg     Family History  Problem Relation Age of Onset   Heart attack Father    Lung disease Mother    Arrhythmia Sister    Atrial fibrillation Brother    Diabetes Neg Hx      Atrial Fibrillation Management history:  Previous antiarrhythmic drugs: dofetilide  Previous cardioversions: 2014 Previous ablations: 2017, 2018, 02/29/20 CHADS2VASC score: 4 Anticoagulation history: Xarelto   Past Medical History:  Diagnosis Date   Benign localized prostatic hyperplasia with lower urinary tract symptoms (LUTS)    urologist-- dr Diona Fanti   Chronic anticoagulation    Xarelto for afib   Chronic constipation    Chronic cystitis    Gross  hematuria    History of DVT of lower extremity 06/08/2010   right femoral dvt dx post op total hip 05-08-2010   History of gastric ulcer    remote   History of kidney stones    one stone. remote hx   Hyperlipidemia    Hypertension    Dx by Dr. Lance Muss around age  52    NICM (nonischemic cardiomyopathy) ([place])    per echo 06-11-2017,  ef 45-50% with diffuse hypokinesis,  G1DD   OSA on CPAP 2017   compliant with CPAP.  managed by Dr Elsworth Soho.   Osteoarthritis    Other urethral stricture, male, meatal    chronic;  pt self dilates   Paroxysmal atrial fibrillation [facility]) cardiologist-- dr Rayann Heman   first dx 2009/   a. documented by Dr Barrett Shell on office EKG 9/12. b. maintained on Tikosyn and Xarelto. c. s/p DCCV 's.  d.  x3  EP w/ ablation atrial fib last one 12-05-2016   Prediabetes    Premature ventricular contraction    PVC's (premature ventricular contractions)    RA (rheumatoid arthritis) ([place])    rheumatologist--- Dr Page Spiro,  treated with Humira   RBBB (right bundle branch block)    Hx of a fib, ablation Aug 2018   Rheumatoid arthritis ([place])    Vitamin D deficiency    Wears hearing aid in both ears    Past Surgical History:  Procedure Laterality Date   ABLATION OF DYSRHYTHMIC FOCUS  08/29/2015   ATRIAL FIBRILLATION  ABLATION  12/05/2016   ATRIAL FIBRILLATION ABLATION N/A 12/05/2016   Procedure: Atrial Fibrillation Ablation;  Surgeon: [name], MD;  Location: [place] [facility];  Service: Cardiovascular;  Laterality: N/A;   ATRIAL FIBRILLATION ABLATION N/A 02/29/2020   Procedure: ATRIAL FIBRILLATION ABLATION;  Surgeon: [name], MD;  Location: [place] [facility];  Service: Cardiovascular;  Laterality: N/A;   ATRIAL FLUTTER ABLATION  09/2010   by Glenford Bayley Right 1990s   CARDIOVERSION N/A 10/28/2012   Procedure: CARDIOVERSION;  Surgeon: Burnell Blanks, MD;  Location: [place];  Service: Cardiovascular;  Laterality: N/A;   CATARACT EXTRACTION W/ INTRAOCULAR  LENS  IMPLANT, BILATERAL  2019   CYSTOSCOPY WITH BIOPSY N/A 09/03/2018   Procedure: CYSTOSCOPY WITH FULGURATION Fran Lowes;  Surgeon: Franchot Gallo, MD;  Location: [facility];  Service: Urology;  Laterality: N/A;  30 MINS   ELECTROPHYSIOLOGIC STUDY N/A 08/29/2015   Procedure: Atrial Fibrillation Ablation;  Surgeon: [name], MD;  Location: [place] [facility];  Service: Cardiovascular;  Laterality: N/A;   KNEE SURGERY Bilateral x3   1960s & 1970s   open   LEFT HEART CATHETERIZATION WITH CORONARY ANGIOGRAM N/A 06/07/2011   Procedure: LEFT HEART CATHETERIZATION WITH CORONARY ANGIOGRAM;  Surgeon: Peter M Martinique, MD;  Location: [facility];  Service: Cardiovascular;  Laterality: N/A;    Normal coronary arteries,  low normal LVF   TONSILLECTOMY AND ADENOIDECTOMY  age 69   TOTAL HIP ARTHROPLASTY Right 06/04/10    dr Noemi Chapel  @[facility]    TOTAL KNEE ARTHROPLASTY Bilateral right 1995;  left [place]  06-29-2002   dr humphries  @[facility]    TRANSURETHRAL RESECTION OF PROSTATE N/A 07/06/2018   Procedure: TRANSURETHRAL RESECTION OF THE PROSTATE (TURP);  Surgeon: Franchot Gallo, MD;  [facility];  Service: Urology;  Laterality: N/A;  45 MINS    Current Outpatient Medications  Medication Sig Dispense Refill   acetaminophen (TYLENOL) 500 MG tablet Take 1,000-1,500 mg by mouth 2 (two) times daily as needed for moderate pain.      atorvastatin (LIPITOR) 10 MG tablet TAKE 1 TABLET BY MOUTH EVERY DAY 90 tablet 3   Carboxymethylcellulose Sodium (THERATEARS OP) Apply 1 drop to eye daily as needed (dry eyes).     Cholecalciferol (VITAMIN D3) 5000 UNITS TABS Take 5,000 Units by mouth See admin instructions. Every 4 days     clindamycin (CLEOCIN T) 1 % external solution Apply 1 application topically daily as needed (for bumps in scalp). Apply to scalp  2   Cranberry 1000 MG CAPS Take 2,000-3,000 mg by mouth See admin instructions. Take 20000 mg in the morning and 3000 mg  in the evening     Docusate Calcium (STOOL SOFTENER PO) Take 2 tablets by mouth at bedtime.      finasteride (PROSCAR) 5 MG tablet Take 5 mg by mouth every morning.   3   HUMIRA PEN 40 MG/0.4ML PNKT Inject 40 mg as directed every 7 (seven) days. Every Tuesday  6   lisinopril (ZESTRIL) 40 MG tablet Take 1 tablet (40 mg total) by mouth daily. 90 tablet 3   methocarbamol (ROBAXIN) 500 MG tablet Take 1 tablet (500 mg total) by mouth at bedtime as needed for muscle spasms. 21 tablet 0   metoprolol succinate (TOPROL-XL) 50 MG 24 hr tablet TAKE 1/2 TABLET IN THE MORNING AND TAKE 1 TABLET IN THE EVENING 135 tablet 3   Multiple Vitamins-Minerals (PRESERVISION AREDS 2) CAPS Take  1 capsule by mouth 2 (two) times a day.     Omega-3 Fatty Acids (OMEGA 3 PO) Take 3 capsules by mouth every evening.     omeprazole (PRILOSEC) 20 MG capsule Take 20 mg by mouth daily.     solifenacin (VESICARE) 10 MG tablet Take 10 mg by mouth at bedtime.      tamsulosin (FLOMAX) 0.4 MG CAPS capsule Take 1 capsule (0.4 mg total) by mouth daily after supper. 30 capsule 5   vitamin B-12 (CYANOCOBALAMIN) 500 MCG tablet Take 500 mcg by mouth every evening.      XARELTO 20 MG TABS tablet TAKE 1 TABLET BY MOUTH EVERY DAY IN THE EVENING 90 tablet 1   No current facility-administered medications for this encounter.    Allergies  Allergen Reactions   Penicillins Itching, Swelling and Other (See Comments)    Did it involve swelling of the face/tongue/throat, SOB, or low BP? No Did it involve sudden or severe rash/hives, skin peeling, or any reaction on the inside of your mouth or nose? No Did you need to seek medical attention at a hospital or doctor's office? No When did it last happen?      30 + years If all above answers are "NO", may proceed with cephalosporin use.      Social History   Socioeconomic History   Marital status: Married    Spouse name: Joycelyn Schmid   Number of children: Not on file   Years of education: Not on  file   Highest education level: Not on file  Occupational History   Occupation: Retired  Tobacco Use   Smoking status: Former    Packs/day: 1.00    Years: 30.00    Pack years: 30.00    Types: Cigarettes    Quit date: 05/06/1978    Years since quitting: 42.8   Smokeless tobacco: Never   Tobacco comments:    Former smoker 03/14/2021  Vaping Use   Vaping Use: Never used  Substance and Sexual Activity   Alcohol use: Yes    Comment: occasionally wine and beer   Drug use: Never   Sexual activity: Not on file  Other Topics Concern   Not on file  Social History Narrative   he patient lives in [place] with his spouse.  Retired.   Social Determinants of Health   Financial Resource Strain: Low Risk    Difficulty of Paying Living Expenses: Not hard at all  Food Insecurity: No Food Insecurity   Worried About Charity fundraiser in the Last Year: Never true   [place] in the Last Year: Never true  Transportation Needs: No Transportation Needs   Lack of Transportation (Medical): No   Lack of Transportation (Non-Medical): No  Physical Activity: Sufficiently Active   Days of Exercise per Week: 7 days   Minutes of Exercise per Session: 30 min  Stress: No Stress Concern Present   Feeling of Stress : Not at all  Social Connections: Moderately Integrated   Frequency of Communication with Friends and Family: More than three times a week   Frequency of Social Gatherings with Friends and Family: Once a week   Attends Religious Services: More than 4 times per year   Active Member of [employer] or Organizations: No   Attends Archivist Meetings: Never   Marital Status: Married  Human resources officer Violence: Not At Risk   Fear of Current or Ex-Partner: No   Emotionally Abused: No   Physically Abused: No  Sexually Abused: No     ROS- All systems are reviewed and negative except as per the HPI above.  Physical Exam: Vitals:   03/14/21 0958  BP: 130/88  Pulse: 63  Weight:  101 kg  Height: 5\' 8"  (1.727 m)    GEN- The patient is a well appearing obese elderly male, alert and oriented x 3 today.   HEENT-head normocephalic, atraumatic, sclera clear, conjunctiva pink, hearing intact, trachea midline. Lungs- Clear to ausculation bilaterally, normal work of breathing Heart- Regular rate and rhythm, no murmurs, rubs or gallops  GI- soft, NT, ND, + BS Extremities- no clubbing, cyanosis, or edema MS- no significant deformity or atrophy Skin- no rash or lesion Psych- euthymic mood, full affect Neuro- strength and sensation are intact   Wt Readings from Last 3 Encounters:  03/14/21 101 kg  03/12/21 101.2 kg  12/20/20 100.4 kg    EKG today demonstrates  SR, RBBB Vent. rate 63 BPM PR interval 182 ms QRS duration 138 ms QT/QTcB 462/472 ms  Echo 02/25/20 demonstrated  1. Left ventricular ejection fraction, by estimation, is 50 to 55%. The  left ventricle has low normal function. The left ventricle has no regional wall motion abnormalities. The left ventricular internal cavity size was mildly dilated. Left ventricular diastolic parameters are consistent with Grade I diastolic dysfunction (impaired relaxation).   2. Right ventricular systolic function is normal. The right ventricular  size is mildly enlarged. There is mildly elevated pulmonary artery  systolic pressure. The estimated right ventricular systolic pressure is  40.3 mmHg.   3. Left atrial size was mildly dilated.   4. Right atrial size was mildly dilated.   5. The mitral valve is normal in structure. Trivial mitral valve  regurgitation. No evidence of mitral stenosis.   6. The aortic valve is normal in structure. Aortic valve regurgitation is  not visualized. No aortic stenosis is present.   7. Aortic dilatation noted. There is mild dilatation at the level of the  sinuses of Valsalva, measuring 38 mm.   8. The inferior vena cava is normal in size with greater than 50%  respiratory variability,  suggesting right atrial pressure of 3 mmHg.   Comparison(s): Prior images reviewed side by side. The left ventricular function has improved. 06/11/17 EF 45-50%.   Epic records are reviewed at length today  CHA2DS2-VASc Score = 4  The patient's score is based upon: CHF History: 1 HTN History: 1 Diabetes History: 0 Stroke History: 0 Vascular Disease History: 0 Age Score: 2 Gender Score: 0      ASSESSMENT AND PLAN: 1. Paroxysmal Atrial Fibrillation (ICD10:  I48.0) The patient's CHA2DS2-VASc score is 4, indicating a 4.8% annual risk of stroke.   S/p repeat ablation 02/29/20 Patient maintaining SR. Continue Xarelto 20 mg daily Continue Toprol 25 mg AM and 50 mg PM Kardia for home monitoring.   2. Secondary Hypercoagulable State (ICD10:  D68.69) The patient is at significant risk for stroke/thromboembolism based upon his CHA2DS2-VASc Score of 4.  Continue Rivaroxaban (Xarelto).   3. Obesity Body mass index is 33.85 kg/m. Lifestyle modification was discussed and encouraged including regular physical activity and weight reduction.  4. Obstructive sleep apnea Patient reports compliance with CPAP therapy.  5. NICM EF 50-55% No signs or symptoms of fluid overload.  6. HTN Stable, no changes today.    Follow up in the AF clinic in 6 months. Will also have him establish care with a primary cardiologist.    Adline Peals PA-C Afib  [facility] [place], [place] [address] [phone] 03/14/2021 10:10 AM

## 2021-03-15 ENCOUNTER — Telehealth: Payer: Self-pay | Admitting: *Deleted

## 2021-03-15 NOTE — Telephone Encounter (Signed)
Per referral Dr. Lorelei Pont - called and gave upcoming appointments - confirmed.

## 2021-03-16 ENCOUNTER — Other Ambulatory Visit: Payer: Self-pay | Admitting: Family

## 2021-03-16 DIAGNOSIS — D7589 Other specified diseases of blood and blood-forming organs: Secondary | ICD-10-CM

## 2021-03-19 ENCOUNTER — Inpatient Hospital Stay: Payer: Medicare PPO | Attending: Hematology & Oncology

## 2021-03-19 ENCOUNTER — Other Ambulatory Visit: Payer: Self-pay

## 2021-03-19 ENCOUNTER — Inpatient Hospital Stay: Payer: Medicare PPO | Admitting: Family

## 2021-03-19 ENCOUNTER — Encounter: Payer: Self-pay | Admitting: Family

## 2021-03-19 VITALS — BP 101/60 | HR 78 | Temp 98.0°F | Resp 18 | Ht 68.0 in | Wt 219.1 lb

## 2021-03-19 DIAGNOSIS — D7589 Other specified diseases of blood and blood-forming organs: Secondary | ICD-10-CM | POA: Insufficient documentation

## 2021-03-19 DIAGNOSIS — R531 Weakness: Secondary | ICD-10-CM

## 2021-03-19 DIAGNOSIS — Z8546 Personal history of malignant neoplasm of prostate: Secondary | ICD-10-CM | POA: Diagnosis not present

## 2021-03-19 DIAGNOSIS — Z87891 Personal history of nicotine dependence: Secondary | ICD-10-CM | POA: Diagnosis not present

## 2021-03-19 DIAGNOSIS — R42 Dizziness and giddiness: Secondary | ICD-10-CM | POA: Diagnosis not present

## 2021-03-19 DIAGNOSIS — Z86718 Personal history of other venous thrombosis and embolism: Secondary | ICD-10-CM | POA: Insufficient documentation

## 2021-03-19 DIAGNOSIS — Z7901 Long term (current) use of anticoagulants: Secondary | ICD-10-CM | POA: Diagnosis not present

## 2021-03-19 DIAGNOSIS — Z923 Personal history of irradiation: Secondary | ICD-10-CM | POA: Insufficient documentation

## 2021-03-19 LAB — CBC WITH DIFFERENTIAL (CANCER CENTER ONLY)
Abs Immature Granulocytes: 0.02 10*3/uL (ref 0.00–0.07)
Basophils Absolute: 0 10*3/uL (ref 0.0–0.1)
Basophils Relative: 0 %
Eosinophils Absolute: 0.2 10*3/uL (ref 0.0–0.5)
Eosinophils Relative: 4 %
HCT: 42.1 % (ref 39.0–52.0)
Hemoglobin: 13.6 g/dL (ref 13.0–17.0)
Immature Granulocytes: 0 %
Lymphocytes Relative: 25 %
Lymphs Abs: 1.3 10*3/uL (ref 0.7–4.0)
MCH: 34.5 pg — ABNORMAL HIGH (ref 26.0–34.0)
MCHC: 32.3 g/dL (ref 30.0–36.0)
MCV: 106.9 fL — ABNORMAL HIGH (ref 80.0–100.0)
Monocytes Absolute: 0.6 10*3/uL (ref 0.1–1.0)
Monocytes Relative: 12 %
Neutro Abs: 3 10*3/uL (ref 1.7–7.7)
Neutrophils Relative %: 59 %
Platelet Count: 149 10*3/uL — ABNORMAL LOW (ref 150–400)
RBC: 3.94 MIL/uL — ABNORMAL LOW (ref 4.22–5.81)
RDW: 13.1 % (ref 11.5–15.5)
WBC Count: 5.2 10*3/uL (ref 4.0–10.5)
nRBC: 0 % (ref 0.0–0.2)

## 2021-03-19 LAB — RETICULOCYTES
Immature Retic Fract: 10.6 % (ref 2.3–15.9)
RBC.: 3.94 MIL/uL — ABNORMAL LOW (ref 4.22–5.81)
Retic Count, Absolute: 61.5 10*3/uL (ref 19.0–186.0)
Retic Ct Pct: 1.6 % (ref 0.4–3.1)

## 2021-03-19 LAB — CMP (CANCER CENTER ONLY)
ALT: 17 U/L (ref 0–44)
AST: 17 U/L (ref 15–41)
Albumin: 4 g/dL (ref 3.5–5.0)
Alkaline Phosphatase: 79 U/L (ref 38–126)
Anion gap: 7 (ref 5–15)
BUN: 15 mg/dL (ref 8–23)
CO2: 31 mmol/L (ref 22–32)
Calcium: 9.2 mg/dL (ref 8.9–10.3)
Chloride: 105 mmol/L (ref 98–111)
Creatinine: 0.84 mg/dL (ref 0.61–1.24)
GFR, Estimated: >60 mL/min (ref >60)
Glucose, Bld: 144 mg/dL — ABNORMAL HIGH (ref 70–99)
Potassium: 3.8 mmol/L (ref 3.5–5.1)
Sodium: 143 mmol/L (ref 135–145)
Total Bilirubin: 0.8 mg/dL (ref 0.3–1.2)
Total Protein: 6.4 g/dL — ABNORMAL LOW (ref 6.5–8.1)

## 2021-03-19 LAB — VITAMIN B12: Vitamin B-12: 941 pg/mL — ABNORMAL HIGH (ref 180–914)

## 2021-03-19 LAB — FOLATE: Folate: 8.9 ng/mL (ref >5.9)

## 2021-03-19 LAB — LACTATE DEHYDROGENASE: LDH: 184 U/L (ref 98–192)

## 2021-03-19 LAB — SAVE SMEAR(SSMR), FOR PROVIDER SLIDE REVIEW

## 2021-03-19 NOTE — Progress Notes (Addendum)
Hematology/Oncology Consultation   Name: Connor [name]      MRN: [id number]    Location: Room/bed info not found  Date: 03/19/2021 Time:8:50 AM   REFERRING PHYSICIAN: Lamar Blinks, MD   REASON FOR CONSULT:  Macrocytosis   DIAGNOSIS: Macrocytosis with and without anemia   HISTORY OF PRESENT ILLNESS:  Connor [name] is a very pleasant 87 yo caucasian gentleman with greater than 10 year history of intermittent macrocytosis with and without anemia.  He is currently taking PO B12 500 mcg daily.  WBC count 4.8, Hgb 14.4, MCV 102 and platelets 135.  No known family history of anemia or macrocytosis. He notes fatigue and low stamina.  He has occasional episodes of dizziness and states that neck muscle tightness can exacerbate. Massage helps resolve this. He has also bee in PT for vertigo.  He is currently in PT at this time for weakness.  He wears a CPAP at night but intermittently. He has a hard time tolerating this for a long period of time.  He has history of a flutter with 1 ablation as well as atrial fib with 3 ablations. He is currently on Xarelto.  He denies bleeding, bruising and petechiae.  He has recent history of prostate cancer treated with 40 fractions of radiation. He states that his latest PSA with urology was 0.015. No other history of cancer.  No history of diabetes or thyroid disease.  No known liver or spleen issues.  No fever, chills, n/v, cough, rash, SOB, chest pain, palpitations, abdominal pain or changes in bowel or bladder habits.  He takes a stool softener daily to help.  No numbness or tingling in his extremities at this time.  He has chronic swelling in his lower extremities. He states that this resolves overnight while he is laying down.  No falls or syncope reported.  His appetite is ok but he admits that he needs to better hydrate throughout the day.  He quit smoking over 40 years ago. No recreational drug use. Rare ETOH use socially.  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   ROS: All other 10 point review of systems is negative.   PAST MEDICAL HISTORY:   Past Medical History:  Diagnosis Date   Benign localized prostatic hyperplasia with lower urinary tract symptoms (LUTS)    urologist-- dr Diona Fanti   Chronic anticoagulation    Xarelto for afib   Chronic constipation    Chronic cystitis    Gross hematuria    History of DVT of lower extremity 06/08/2010   right femoral dvt dx post op total hip 05-08-2010   History of gastric ulcer    remote   History of kidney stones    one stone. remote hx   Hyperlipidemia    Hypertension    Dx by Dr. Lance Muss around age  51    NICM (nonischemic cardiomyopathy) ([place])    per echo 06-11-2017,  ef 45-50% with diffuse hypokinesis,  G1DD   OSA on CPAP 2017   compliant with CPAP.  managed by Dr Elsworth Soho.   Osteoarthritis    Other urethral stricture, male, meatal    chronic;  pt self dilates   Paroxysmal atrial fibrillation [facility]) cardiologist-- dr Rayann Heman   first dx 2009/   a. documented by Dr Barrett Shell on office EKG 9/12. b. maintained on Tikosyn and Xarelto. c. s/p DCCV 's.  d.  x3  EP w/ ablation atrial  fib last one 12-05-2016   Prediabetes    Premature ventricular contraction    PVC's (premature ventricular contractions)    RA (rheumatoid arthritis) ([place])    rheumatologist--- Dr Page Spiro,  treated with Humira   RBBB (right bundle branch block)    Hx of a fib, ablation Aug 2018   Rheumatoid arthritis ([place])    Vitamin D deficiency    Wears hearing aid in both ears     ALLERGIES: Allergies  Allergen Reactions   Penicillins Itching, Swelling and Other (See Comments)    Did it involve swelling of the face/tongue/throat, SOB, or low BP? No Did it involve sudden or severe rash/hives, skin peeling, or any reaction on the inside of your mouth or nose? No Did you need to seek medical attention at a hospital or doctor's office? No When did  it last happen?      30 + years If all above answers are "NO", may proceed with cephalosporin use.        MEDICATIONS:  Current Outpatient Medications on File Prior to Visit  Medication Sig Dispense Refill   acetaminophen (TYLENOL) 500 MG tablet Take 1,000-1,500 mg by mouth 2 (two) times daily as needed for moderate pain.      atorvastatin (LIPITOR) 10 MG tablet TAKE 1 TABLET BY MOUTH EVERY DAY 90 tablet 3   Carboxymethylcellulose Sodium (THERATEARS OP) Apply 1 drop to eye daily as needed (dry eyes).     Cholecalciferol (VITAMIN D3) 5000 UNITS TABS Take 5,000 Units by mouth See admin instructions. Every 4 days     clindamycin (CLEOCIN T) 1 % external solution Apply 1 application topically daily as needed (for bumps in scalp). Apply to scalp  2   Cranberry 1000 MG CAPS Take 2,000-3,000 mg by mouth See admin instructions. Take 20000 mg in the morning and 3000 mg in the evening     Docusate Calcium (STOOL SOFTENER PO) Take 2 tablets by mouth at bedtime.      finasteride (PROSCAR) 5 MG tablet Take 5 mg by mouth every morning.   3   HUMIRA PEN 40 MG/0.4ML PNKT Inject 40 mg as directed every 7 (seven) days. Every Tuesday  6   lisinopril (ZESTRIL) 40 MG tablet Take 1 tablet (40 mg total) by mouth daily. 90 tablet 3   methocarbamol (ROBAXIN) 500 MG tablet Take 1 tablet (500 mg total) by mouth at bedtime as needed for muscle spasms. 21 tablet 0   metoprolol succinate (TOPROL-XL) 50 MG 24 hr tablet TAKE 1/2 TABLET IN THE MORNING AND TAKE 1 TABLET IN THE EVENING 135 tablet 3   Multiple Vitamins-Minerals (PRESERVISION AREDS 2) CAPS Take 1 capsule by mouth 2 (two) times a day.     Omega-3 Fatty Acids (OMEGA 3 PO) Take 3 capsules by mouth every evening.     omeprazole (PRILOSEC) 20 MG capsule Take 20 mg by mouth daily.     solifenacin (VESICARE) 10 MG tablet Take 10 mg by mouth at bedtime.      tamsulosin (FLOMAX) 0.4 MG CAPS capsule Take 1 capsule (0.4 mg total) by mouth daily after supper. 30 capsule 5    vitamin B-12 (CYANOCOBALAMIN) 500 MCG tablet Take 500 mcg by mouth every evening.      XARELTO 20 MG TABS tablet TAKE 1 TABLET BY MOUTH EVERY DAY IN THE EVENING 90 tablet 1   No current facility-administered medications on file prior to visit.     PAST SURGICAL HISTORY Past Surgical History:  Procedure  Laterality Date   ABLATION OF DYSRHYTHMIC FOCUS  08/29/2015   ATRIAL FIBRILLATION ABLATION  12/05/2016   ATRIAL FIBRILLATION ABLATION N/A 12/05/2016   Procedure: Atrial Fibrillation Ablation;  Surgeon: [name], MD;  Location: [place] [facility];  Service: Cardiovascular;  Laterality: N/A;   ATRIAL FIBRILLATION ABLATION N/A 02/29/2020   Procedure: ATRIAL FIBRILLATION ABLATION;  Surgeon: [name], MD;  Location: [place] [facility];  Service: Cardiovascular;  Laterality: N/A;   ATRIAL FLUTTER ABLATION  09/2010   by Glenford Bayley Right 1990s   CARDIOVERSION N/A 10/28/2012   Procedure: CARDIOVERSION;  Surgeon: Burnell Blanks, MD;  Location: [place];  Service: Cardiovascular;  Laterality: N/A;   CATARACT EXTRACTION W/ INTRAOCULAR LENS  IMPLANT, BILATERAL  2019   CYSTOSCOPY WITH BIOPSY N/A 09/03/2018   Procedure: CYSTOSCOPY WITH FULGURATION Fran Lowes;  Surgeon: Franchot Gallo, MD;  Location: [facility];  Service: Urology;  Laterality: N/A;  30 MINS   ELECTROPHYSIOLOGIC STUDY N/A 08/29/2015   Procedure: Atrial Fibrillation Ablation;  Surgeon: [name], MD;  Location: [place] [facility];  Service: Cardiovascular;  Laterality: N/A;   KNEE SURGERY Bilateral x3   1960s & 1970s   open   LEFT HEART CATHETERIZATION WITH CORONARY ANGIOGRAM N/A 06/07/2011   Procedure: LEFT HEART CATHETERIZATION WITH CORONARY ANGIOGRAM;  Surgeon: Peter M Martinique, MD;  Location: [facility] CATH LAB;  Service: Cardiovascular;  Laterality: N/A;    Normal coronary arteries,  low normal LVF   TONSILLECTOMY AND ADENOIDECTOMY  age 81   TOTAL HIP ARTHROPLASTY Right 06/04/10    dr Noemi Chapel  @[facility]    TOTAL KNEE ARTHROPLASTY  Bilateral right 1995;  left [place]  06-29-2002   dr humphries  @[facility]    TRANSURETHRAL RESECTION OF PROSTATE N/A 07/06/2018   Procedure: TRANSURETHRAL RESECTION OF THE PROSTATE (TURP);  Surgeon: Franchot Gallo, MD;  Location: [facility];  Service: Urology;  Laterality: N/A;  51 MINS    FAMILY HISTORY: Family History  Problem Relation Age of Onset   Heart attack Father    Lung disease Mother    Arrhythmia Sister    Atrial fibrillation Brother    Diabetes Neg Hx     SOCIAL HISTORY:  reports that he quit smoking about 42 years ago. His smoking use included cigarettes. He has a 30.00 pack-year smoking history. He has never used smokeless tobacco. He reports current alcohol use. He reports that he does not use drugs.  PERFORMANCE STATUS: The patient's performance status is 1 - Symptomatic but completely ambulatory  PHYSICAL EXAM: Most Recent Vital Signs: There were no vitals taken for this visit. BP 101/60 (BP Location: Left Arm, Patient Position: Sitting)   Pulse 78   Temp 98 F (36.7 C) (Oral)   Resp 18   Ht 5\' 8"  (1.727 m)   Wt 219 lb 1.3 oz (99.4 kg)   SpO2 95%   BMI 33.31 kg/m   General Appearance:    Alert, cooperative, no distress, appears stated age  Head:    Normocephalic, without obvious abnormality, atraumatic  Eyes:    PERRL, conjunctiva/corneas clear, EOM's intact, fundi    benign, both eyes             Throat:   Lips, mucosa, and tongue normal; teeth and gums normal  Neck:   Supple, symmetrical, trachea midline, no adenopathy;       thyroid:  No enlargement/tenderness/nodules; no carotid   bruit or JVD  Back:  Symmetric, no curvature, ROM normal, no CVA tenderness  Lungs:     Clear to auscultation bilaterally, respirations unlabored  Chest wall:    No tenderness or deformity  Heart:    Regular rate and rhythm, S1 and S2 normal, no murmur, rub   or gallop  Abdomen:     Soft, non-tender, bowel sounds active  all four quadrants,    no masses, no organomegaly        Extremities:   Extremities normal, atraumatic, no cyanosis or edema  Pulses:   2+ and symmetric all extremities  Skin:   Skin color, texture, turgor normal, no rashes or lesions  Lymph nodes:   Cervical, supraclavicular, and axillary nodes normal  Neurologic:   CNII-XII intact. Normal strength, sensation and reflexes      throughout    LABORATORY DATA:  No results found for this or any previous visit (from the past 48 hour(s)).    RADIOGRAPHY: No results found.     PATHOLOGY: None  ASSESSMENT/PLAN: Mr. Challis is a very pleasant 87 yo caucasian gentleman with greater than 10 year history of intermittent macrocytosis with and without anemia.  B 12 is pending. Hgb is stable at 13.6, MCV 106.  Will review smear with MD when available.  Follow-up in 6 months. If everything is stable at that time we will release him back to his PCP.   All questions were answered. The patient knows to call the clinic with any problems, questions or concerns. We can certainly see the patient much sooner if necessary.   Lottie Dawson, NP   Addendum: Labs and blood smear reviewed with Dr. Marin Olp. No abnormality or evidence of malignancy noted. Cells appear to be adequate and well developed.  Follow-up in 6 months as mentioned above.

## 2021-03-20 ENCOUNTER — Telehealth: Payer: Self-pay | Admitting: *Deleted

## 2021-03-20 NOTE — Telephone Encounter (Signed)
No 03/19/21 LOS

## 2021-03-27 ENCOUNTER — Telehealth: Payer: Self-pay | Admitting: Family

## 2021-03-27 NOTE — Addendum Note (Signed)
Addended by: Theadore Nan on: 03/27/2021 12:15 PM   Modules accepted: Orders

## 2021-03-27 NOTE — Telephone Encounter (Signed)
Left message with call back number to go over recent lab results and blood smear review.

## 2021-03-28 DIAGNOSIS — H353132 Nonexudative age-related macular degeneration, bilateral, intermediate dry stage: Secondary | ICD-10-CM | POA: Diagnosis not present

## 2021-04-02 ENCOUNTER — Telehealth: Payer: Self-pay | Admitting: *Deleted

## 2021-04-02 NOTE — Telephone Encounter (Signed)
Per 03/19/21 los entered late Judson Roch - called and gave upcoming appointments

## 2021-04-03 ENCOUNTER — Encounter: Payer: Self-pay | Admitting: Physical Therapy

## 2021-04-03 ENCOUNTER — Ambulatory Visit: Payer: Medicare PPO | Attending: Family Medicine | Admitting: Physical Therapy

## 2021-04-03 ENCOUNTER — Other Ambulatory Visit: Payer: Self-pay

## 2021-04-03 VITALS — HR 73

## 2021-04-03 DIAGNOSIS — R262 Difficulty in walking, not elsewhere classified: Secondary | ICD-10-CM | POA: Diagnosis not present

## 2021-04-03 DIAGNOSIS — M6281 Muscle weakness (generalized): Secondary | ICD-10-CM | POA: Diagnosis not present

## 2021-04-03 DIAGNOSIS — R2681 Unsteadiness on feet: Secondary | ICD-10-CM | POA: Diagnosis not present

## 2021-04-03 DIAGNOSIS — R29898 Other symptoms and signs involving the musculoskeletal system: Secondary | ICD-10-CM | POA: Diagnosis not present

## 2021-04-03 NOTE — Therapy (Signed)
[address] [place], [place], [address] Phone: [phone]   Fax:  [phone]  Physical Therapy Evaluation  Patient Details  Name: Connor [name] MRN: [id number] Date of Birth: 1933/05/22 Referring Provider (PT): Copland   Encounter Date: 04/03/2021   PT End of Session - 04/03/21 1651     Visit Number 1    Number of Visits 12    Date for PT Re-Evaluation 05/18/21    Authorization Type [employer]    PT Start Time 1614    PT Stop Time 2947    PT Time Calculation (min) 38 min    Activity Tolerance Patient limited by fatigue    Behavior During Therapy [facility] for tasks assessed/performed             Past Medical History:  Diagnosis Date   Benign localized prostatic hyperplasia with lower urinary tract symptoms (LUTS)    urologist-- dr Diona Fanti   Chronic anticoagulation    Xarelto for afib   Chronic constipation    Chronic cystitis    Gross hematuria    History of DVT of lower extremity 06/08/2010   right femoral dvt dx post op total hip 05-08-2010   History of gastric ulcer    remote   History of kidney stones    one stone. remote hx   Hyperlipidemia    Hypertension    Dx by Dr. Lance Muss around age  18    NICM (nonischemic cardiomyopathy) ([place])    per echo 06-11-2017,  ef 45-50% with diffuse hypokinesis,  G1DD   OSA on CPAP 2017   compliant with CPAP.  managed by Dr Elsworth Soho.   Osteoarthritis    Other urethral stricture, male, meatal    chronic;  pt self dilates   Paroxysmal atrial fibrillation [facility]) cardiologist-- dr Rayann Heman   first dx 2009/   a. documented by Dr Barrett Shell on office EKG 9/12. b. maintained on Tikosyn and Xarelto. c. s/p DCCV 's.  d.  x3  EP w/ ablation atrial fib last one 12-05-2016   Prediabetes    Premature ventricular contraction    PVC's (premature ventricular contractions)    RA (rheumatoid arthritis) ([place])    rheumatologist--- Dr Page Spiro,  treated with Humira   RBBB (right  bundle branch block)    Hx of a fib, ablation Aug 2018   Rheumatoid arthritis ([place])    Vitamin D deficiency    Wears hearing aid in both ears     Past Surgical History:  Procedure Laterality Date   ABLATION OF DYSRHYTHMIC FOCUS  08/29/2015   ATRIAL FIBRILLATION ABLATION  12/05/2016   ATRIAL FIBRILLATION ABLATION N/A 12/05/2016   Procedure: Atrial Fibrillation Ablation;  Surgeon: [name], MD;  Location: [place] [facility];  Service: Cardiovascular;  Laterality: N/A;   ATRIAL FIBRILLATION ABLATION N/A 02/29/2020   Procedure: ATRIAL FIBRILLATION ABLATION;  Surgeon: [name], MD;  Location: [place] [facility];  Service: Cardiovascular;  Laterality: N/A;   ATRIAL FLUTTER ABLATION  09/2010   by Glenford Bayley Right 1990s   CARDIOVERSION N/A 10/28/2012   Procedure: CARDIOVERSION;  Surgeon: Burnell Blanks, MD;  Location: [place];  Service: Cardiovascular;  Laterality: N/A;   CATARACT EXTRACTION W/ INTRAOCULAR LENS  IMPLANT, BILATERAL  2019   CYSTOSCOPY WITH BIOPSY N/A 09/03/2018   Procedure: CYSTOSCOPY WITH FULGURATION Fran Lowes;  Surgeon: Franchot Gallo, MD;  [facility];  Service: Urology;  Laterality: N/A;  30 MINS  ELECTROPHYSIOLOGIC STUDY N/A 08/29/2015   Procedure: Atrial Fibrillation Ablation;  Surgeon: [name], MD;  Location: [place] [facility];  Service: Cardiovascular;  Laterality: N/A;   KNEE SURGERY Bilateral x3   1960s & 1970s   open   LEFT HEART CATHETERIZATION WITH CORONARY ANGIOGRAM N/A 06/07/2011   Procedure: LEFT HEART CATHETERIZATION WITH CORONARY ANGIOGRAM;  Surgeon: Peter M Martinique, MD;  Location: [facility] CATH LAB;  Service: Cardiovascular;  Laterality: N/A;    Normal coronary arteries,  low normal LVF   TONSILLECTOMY AND ADENOIDECTOMY  age 62   TOTAL HIP ARTHROPLASTY Right 06/04/10    dr Noemi Chapel  @[facility]    TOTAL KNEE ARTHROPLASTY Bilateral right 1995;  left 1997   TRANSURETHRAL RESECTION OF PROSTATE  06-29-2002   dr humphries  @[facility]    TRANSURETHRAL  RESECTION OF PROSTATE N/A 07/06/2018   Procedure: TRANSURETHRAL RESECTION OF THE PROSTATE (TURP);  Surgeon: Franchot Gallo, MD;  Location: [facility];  Service: Urology;  Laterality: N/A;  [place]:   04/03/21 1627  Pulse: 73  SpO2: 95%      Subjective Assessment - 04/03/21 1613     Subjective Patient reports that he feels that he is getting tired and fatigued "too easy", I can't walk or do much without needing to rest, "my heart is in rhythm and they say my heart is good".  "all my blood work is fine"    Pertinent History HOH, HTN, prediabetes, kidney stones, PVC's, past a fib, right TKA 27 years ago, left TKA 25 years ago    How long can you walk comfortably? less than 10 minutes straight, can do some yardwork as well but really have to stop and rest often    Patient Stated Goals increase stamina, endurance, strngth, feel more stable with walking    Currently in Pain? No/denies                [facility] PT Assessment - 04/03/21 0001       Assessment   Medical Diagnosis weakness, difficulty walking    Referring Provider (PT) Copland    Onset Date/Surgical Date 03/03/21    Prior Therapy for neck pain and dizziness      Precautions   Precautions Fall      Balance Screen   Has the patient fallen in the past 6 months No    Has the patient had a decrease in activity level because of a fear of falling?  Yes    Is the patient reluctant to leave their home because of a fear of falling?  No      Home Environment   Additional Comments does some housework and yardwork      Prior Function   Level of Independence Independent    Vocation Retired    Leisure likes to go to the [employer] Comments fwd head, rounded shoulders      ROM / Strength   AROM / PROM / Strength AROM;Strength      Strength   Overall Strength Comments LE's 4-/5, UE's 4/5      Transfers   Comments really has diffiuclty getting up from chair  needs several tries      Ambulation/Gait   Gait Comments no device, WBOS, when tired he was unstable      6 Minute Walk- Baseline   6 Minute Walk- Baseline yes    HR (bpm) 73    02 Sat (%  RA) 95 %    Modified Borg Scale for Dyspnea 0- Nothing at all      6 Minute walk- Post Test   6 Minute Walk Post Test yes    HR (bpm) 108    02 Sat (%RA) 96 %    Modified Borg Scale for Dyspnea 7- Severe shortness of breath or very hard breathing      6 minute walk test results    Aerobic Endurance Distance Walked 750    Endurance additional comments needed a short standing break and was very short of breath      Standardized Balance Assessment   Standardized Balance Assessment Timed Up and Go Test      Timed Up and Go Test   Normal TUG (seconds) 18                        Objective measurements completed on examination: See above findings.                  PT Short Term Goals - 04/03/21 1657       PT SHORT TERM GOAL #1   Title Pt will be I and compliant with initial HEP.    Time 3    Period Weeks    Status New               PT Long Term Goals - 04/03/21 1657       PT LONG TERM GOAL #1   Title Pt will be independent with long term HEP for improved posture.    Time 12    Period Weeks    Status New      PT LONG TERM GOAL #2   Title Pt will walk for 30 minutes with no report of lightheadedness, as needed for daily exercise and errands.    Period Weeks    Status New      PT LONG TERM GOAL #3   Title decrease TUG to 12 seconds    Time 12    Period Weeks    Status New      PT LONG TERM GOAL #4   Title walk 1000 feet without difficulty during 6 minute walk test    Time 12    Status New                    Plan - 04/03/21 1652     Clinical Impression Statement Patient reports that over the past year he has had decreased strength and increased fatigue, he reports difficulty with stairs, getting up from sitting, walking and doing  any yardwork.  He reports short of breath and having to take numerous rest breaks, he also reports being unsteady when tired.  He denies falls, He really struggled to get up from sitting, had to use hands, he was able to walk 750 feet with the 6 minute walk test but was "severely short of breath" he required one rest break.  As he fatigued he did start to be off balance and needed CGA to assure safe walking    Stability/Clinical Decision Making Stable/Uncomplicated    Rehab Potential Good    PT Frequency 2x / week    PT Duration 12 weeks    PT Treatment/Interventions ADLs/Self Care Home Management;Gait training;Stair training;Functional mobility training;Therapeutic activities;Therapeutic exercise;Balance training;Neuromuscular re-education;Patient/family education;Manual techniques    PT Next Visit Plan start to work on strength nd endurance    Consulted and Agree with  Plan of Care Patient             Patient will benefit from skilled therapeutic intervention in order to improve the following deficits and impairments:  Decreased coordination, Decreased range of motion, Difficulty walking, Decreased endurance, Cardiopulmonary status limiting activity, Decreased activity tolerance, Decreased strength, Decreased mobility, Decreased balance  Visit Diagnosis: Muscle weakness (generalized) - Plan: PT plan of care cert/re-cert  Difficulty in walking, not elsewhere classified - Plan: PT plan of care cert/re-cert  Unsteadiness on feet - Plan: PT plan of care cert/re-cert     Problem List Patient Active Problem List   Diagnosis Date Noted   Secondary hypercoagulable state ([place]) 03/28/2020   Malignant neoplasm of prostate ([place]) 09/11/2018   Enlarged prostate with urinary obstruction 07/06/2018   Gastrocnemius strain, right, initial encounter 12/10/2017   Baker's cyst of knee, right 12/10/2017   Obesity 03/24/2017   Paroxysmal atrial fibrillation ([place]) 12/05/2016   Near syncope 04/20/2016    Leg hematoma, left, initial encounter 04/20/2016   Bleeding    Ecchymosis    Left hamstring muscle strain    Muscle tear    RBBB 59/97/7414   Chronic systolic dysfunction of left ventricle 07/11/2014   OSA (obstructive sleep apnea) 04/07/2013   Multinodular goiter 01/20/2013   Cervical spondylosis without myelopathy 01/18/2013   Atrial fibrillation with RVR ([place]) 10/27/2012   Left ventricular dysfunction 12/17/2011   Low back pain 09/11/2011   Fatigue 05/22/2011   PVC (premature ventricular contraction) 01/18/2011   Persistent atrial fibrillation ([place]) 01/12/2011   Hyperlipidemia 08/02/2010   Hypertension 08/02/2010   Osteoarthritis 08/02/2010   BPH (benign prostatic hyperplasia) 08/02/2010   GERD (gastroesophageal reflux disease) 08/02/2010   h/o Atrial flutter [facility]) s/p ablation 2012     Sumner Boast, PT 04/03/2021, 5:02 PM  [address] [place], [place], [address] Phone: [phone]   Fax:  [phone]  Name: Connor [name] MRN: [id number] Date of Birth: 03-Aug-1933

## 2021-04-10 ENCOUNTER — Other Ambulatory Visit: Payer: Self-pay

## 2021-04-10 ENCOUNTER — Encounter: Payer: Self-pay | Admitting: Physical Therapy

## 2021-04-10 ENCOUNTER — Ambulatory Visit: Payer: Medicare PPO | Attending: Family Medicine | Admitting: Physical Therapy

## 2021-04-10 DIAGNOSIS — M6281 Muscle weakness (generalized): Secondary | ICD-10-CM | POA: Insufficient documentation

## 2021-04-10 DIAGNOSIS — R2681 Unsteadiness on feet: Secondary | ICD-10-CM | POA: Diagnosis not present

## 2021-04-10 DIAGNOSIS — M542 Cervicalgia: Secondary | ICD-10-CM | POA: Insufficient documentation

## 2021-04-10 DIAGNOSIS — R252 Cramp and spasm: Secondary | ICD-10-CM | POA: Insufficient documentation

## 2021-04-10 DIAGNOSIS — R262 Difficulty in walking, not elsewhere classified: Secondary | ICD-10-CM | POA: Diagnosis not present

## 2021-04-10 NOTE — Therapy (Signed)
[place] [place]. [place], [place], [address] Phone: [phone]   Fax:  [phone]  Physical Therapy Treatment  Patient Details  Name: Connor [name] MRN: [id number] Date of Birth: 30-Dec-1933 Referring Provider (PT): Copland   Encounter Date: 04/10/2021   PT End of Session - 04/10/21 1017     Visit Number 2    Number of Visits 12    Date for PT Re-Evaluation 05/18/21    Authorization Type [employer]    PT Start Time 0925    PT Stop Time 1008    PT Time Calculation (min) 43 min    Activity Tolerance Patient limited by fatigue    Behavior During Therapy [facility] for tasks assessed/performed             Past Medical History:  Diagnosis Date   Benign localized prostatic hyperplasia with lower urinary tract symptoms (LUTS)    urologist-- dr Diona Fanti   Chronic anticoagulation    Xarelto for afib   Chronic constipation    Chronic cystitis    Gross hematuria    History of DVT of lower extremity 06/08/2010   right femoral dvt dx post op total hip 05-08-2010   History of gastric ulcer    remote   History of kidney stones    one stone. remote hx   Hyperlipidemia    Hypertension    Dx by Dr. Lance Muss around age  16    NICM (nonischemic cardiomyopathy) ([place])    per echo 06-11-2017,  ef 45-50% with diffuse hypokinesis,  G1DD   OSA on CPAP 2017   compliant with CPAP.  managed by Dr Elsworth Soho.   Osteoarthritis    Other urethral stricture, male, meatal    chronic;  pt self dilates   Paroxysmal atrial fibrillation [facility]) cardiologist-- dr Rayann Heman   first dx 2009/   a. documented by Dr Barrett Shell on office EKG 9/12. b. maintained on Tikosyn and Xarelto. c. s/p DCCV 's.  d.  x3  EP w/ ablation atrial fib last one 12-05-2016   Prediabetes    Premature ventricular contraction    PVC's (premature ventricular contractions)    RA (rheumatoid arthritis) ([place])    rheumatologist--- Dr Page Spiro,  treated with Humira   RBBB (right  bundle branch block)    Hx of a fib, ablation Aug 2018   Rheumatoid arthritis ([place])    Vitamin D deficiency    Wears hearing aid in both ears     Past Surgical History:  Procedure Laterality Date   ABLATION OF DYSRHYTHMIC FOCUS  08/29/2015   ATRIAL FIBRILLATION ABLATION  12/05/2016   ATRIAL FIBRILLATION ABLATION N/A 12/05/2016   Procedure: Atrial Fibrillation Ablation;  Surgeon: [name], MD;  Location: [place] CV [facility];  Service: Cardiovascular;  Laterality: N/A;   ATRIAL FIBRILLATION ABLATION N/A 02/29/2020   Procedure: ATRIAL FIBRILLATION ABLATION;  Surgeon: [name], MD;  Location: [place] [facility];  Service: Cardiovascular;  Laterality: N/A;   ATRIAL FLUTTER ABLATION  09/2010   by Glenford Bayley Right 1990s   CARDIOVERSION N/A 10/28/2012   Procedure: CARDIOVERSION;  Surgeon: Burnell Blanks, MD;  Location: [place];  Service: Cardiovascular;  Laterality: N/A;   CATARACT EXTRACTION W/ INTRAOCULAR LENS  IMPLANT, BILATERAL  2019   CYSTOSCOPY WITH BIOPSY N/A 09/03/2018   Procedure: CYSTOSCOPY WITH FULGURATION Fran Lowes;  Surgeon: Franchot Gallo, MD;  Location: [facility];  Service: Urology;  Laterality: N/A;  30 MINS  ELECTROPHYSIOLOGIC STUDY N/A 08/29/2015   Procedure: Atrial Fibrillation Ablation;  Surgeon: [name], MD;  Location: [place] [facility];  Service: Cardiovascular;  Laterality: N/A;   KNEE SURGERY Bilateral x3   1960s & 1970s   open   LEFT HEART CATHETERIZATION WITH CORONARY ANGIOGRAM N/A 06/07/2011   Procedure: LEFT HEART CATHETERIZATION WITH CORONARY ANGIOGRAM;  Surgeon: Peter M Martinique, MD;  Location: [facility] CATH LAB;  Service: Cardiovascular;  Laterality: N/A;    Normal coronary arteries,  low normal LVF   TONSILLECTOMY AND ADENOIDECTOMY  age 87   TOTAL HIP ARTHROPLASTY Right 06/04/10    dr Noemi Chapel  @[facility]    TOTAL KNEE ARTHROPLASTY Bilateral right 1995;  left 1997   TRANSURETHRAL RESECTION OF PROSTATE  06-29-2002   dr humphries  @[facility]    TRANSURETHRAL  RESECTION OF PROSTATE N/A 07/06/2018   Procedure: TRANSURETHRAL RESECTION OF THE PROSTATE (TURP);  Surgeon: Franchot Gallo, MD;  Location: [facility];  Service: Urology;  Laterality: N/A;  45 MINS    There were no vitals filed for this visit.   Subjective Assessment - 04/10/21 0929     Subjective Reports his legs were very tired after the evaluation    Currently in Pain? No/denies                               [facility] Adult PT Treatment/Exercise - 04/10/21 0001       High Level Balance   High Level Balance Activities Side stepping    High Level Balance Comments cone toe touches, on airex ball toss, walking ball toss, sidestepping over objects, stepping on and off airex      Exercises   Exercises Knee/Hip      Knee/Hip Exercises: Aerobic   Recumbent Bike partial revs due to right knee tightness, for about a minute and then was able to go around    Nustep level 5 x 6 minutes      Knee/Hip Exercises: Machines for Strengthening   Cybex Knee Extension 5# 2x10    Cybex Knee Flexion 25# 2x10    Other Machine seated row, lats 20# 2x10                       PT Short Term Goals - 04/03/21 1657       PT SHORT TERM GOAL #1   Title Pt will be I and compliant with initial HEP.    Time 3    Period Weeks    Status New               PT Long Term Goals - 04/03/21 1657       PT LONG TERM GOAL #1   Title Pt will be independent with long term HEP for improved posture.    Time 12    Period Weeks    Status New      PT LONG TERM GOAL #2   Title Pt will walk for 30 minutes with no report of lightheadedness, as needed for daily exercise and errands.    Period Weeks    Status New      PT LONG TERM GOAL #3   Title decrease TUG to 12 seconds    Time 12    Period Weeks    Status New      PT LONG TERM GOAL #4   Title walk 1000 feet without difficulty during 6 minute walk test  Time 12    Status New                    Plan - 04/10/21 1017     Clinical Impression Statement Patient with tight knees on the bike, got better with time.  He fatigued with the aerobic piece, is off balance with some of the walking and higher level balance especially on the airex    PT Next Visit Plan start to work on strength nd endurance    Consulted and Agree with Plan of Care Patient             Patient will benefit from skilled therapeutic intervention in order to improve the following deficits and impairments:  Decreased coordination, Decreased range of motion, Difficulty walking, Decreased endurance, Cardiopulmonary status limiting activity, Decreased activity tolerance, Decreased strength, Decreased mobility, Decreased balance  Visit Diagnosis: Muscle weakness (generalized)  Difficulty in walking, not elsewhere classified  Unsteadiness on feet  Cervicalgia     Problem List Patient Active Problem List   Diagnosis Date Noted   Secondary hypercoagulable state ([place]) 03/28/2020   Malignant neoplasm of prostate ([place]) 09/11/2018   Enlarged prostate with urinary obstruction 07/06/2018   Gastrocnemius strain, right, initial encounter 12/10/2017   Baker's cyst of knee, right 12/10/2017   Obesity 03/24/2017   Paroxysmal atrial fibrillation ([place]) 12/05/2016   Near syncope 04/20/2016   Leg hematoma, left, initial encounter 04/20/2016   Bleeding    Ecchymosis    Left hamstring muscle strain    Muscle tear    RBBB 16/02/9603   Chronic systolic dysfunction of left ventricle 07/11/2014   OSA (obstructive sleep apnea) 04/07/2013   Multinodular goiter 01/20/2013   Cervical spondylosis without myelopathy 01/18/2013   Atrial fibrillation with RVR ([place]) 10/27/2012   Left ventricular dysfunction 12/17/2011   Low back pain 09/11/2011   Fatigue 05/22/2011   PVC (premature ventricular contraction) 01/18/2011   Persistent atrial fibrillation ([place]) 01/12/2011   Hyperlipidemia 08/02/2010   Hypertension  08/02/2010   Osteoarthritis 08/02/2010   BPH (benign prostatic hyperplasia) 08/02/2010   GERD (gastroesophageal reflux disease) 08/02/2010   h/o Atrial flutter [facility]) s/p ablation 2012     Sumner Boast, PT 04/10/2021, 10:19 AM  [place]. [place], [place], [address] Phone: [phone]   Fax:  [phone]  Name: Connor [name] MRN: [id number] Date of Birth: [birth year]

## 2021-04-12 ENCOUNTER — Encounter: Payer: Self-pay | Admitting: Physical Therapy

## 2021-04-12 ENCOUNTER — Ambulatory Visit: Payer: Medicare PPO | Admitting: Physical Therapy

## 2021-04-12 ENCOUNTER — Other Ambulatory Visit: Payer: Self-pay

## 2021-04-12 DIAGNOSIS — R262 Difficulty in walking, not elsewhere classified: Secondary | ICD-10-CM | POA: Diagnosis not present

## 2021-04-12 DIAGNOSIS — M542 Cervicalgia: Secondary | ICD-10-CM | POA: Diagnosis not present

## 2021-04-12 DIAGNOSIS — R2681 Unsteadiness on feet: Secondary | ICD-10-CM

## 2021-04-12 DIAGNOSIS — M6281 Muscle weakness (generalized): Secondary | ICD-10-CM

## 2021-04-12 DIAGNOSIS — R252 Cramp and spasm: Secondary | ICD-10-CM | POA: Diagnosis not present

## 2021-04-12 NOTE — Therapy (Signed)
[place]. [place], [place], [address] Phone: [phone]   Fax:  [phone]  Physical Therapy Treatment  Patient Details  Name: Connor [name] MRN: [id number] Date of Birth: [birth year] Referring Provider (PT): Copland   Encounter Date: 04/12/2021   PT End of Session - 04/12/21 1000     Visit Number 3    Number of Visits 12    Date for PT Re-Evaluation 05/18/21    Authorization Type [employer]    PT Start Time 0925    PT Stop Time 1010    PT Time Calculation (min) 45 min    Activity Tolerance Patient limited by fatigue    Behavior During Therapy [facility] for tasks assessed/performed             Past Medical History:  Diagnosis Date   Benign localized prostatic hyperplasia with lower urinary tract symptoms (LUTS)    urologist-- dr Diona Fanti   Chronic anticoagulation    Xarelto for afib   Chronic constipation    Chronic cystitis    Gross hematuria    History of DVT of lower extremity 06/08/2010   right femoral dvt dx post op total hip 05-08-2010   History of gastric ulcer    remote   History of kidney stones    one stone. remote hx   Hyperlipidemia    Hypertension    Dx by Dr. Lance Muss around age  87    NICM (nonischemic cardiomyopathy) ([place])    per echo 06-11-2017,  ef 45-50% with diffuse hypokinesis,  G1DD   OSA on CPAP 2017   compliant with CPAP.  managed by Dr Elsworth Soho.   Osteoarthritis    Other urethral stricture, male, meatal    chronic;  pt self dilates   Paroxysmal atrial fibrillation [facility]) cardiologist-- dr Rayann Heman   first dx 2009/   a. documented by Dr Barrett Shell on office EKG 9/12. b. maintained on Tikosyn and Xarelto. c. s/p DCCV 's.  d.  x3  EP w/ ablation atrial fib last one 12-05-2016   Prediabetes    Premature ventricular contraction    PVC's (premature ventricular contractions)    RA (rheumatoid arthritis) ([place])    rheumatologist--- Dr Page Spiro,  treated with Humira   RBBB (right  bundle branch block)    Hx of a fib, ablation Aug 2018   Rheumatoid arthritis ([place])    Vitamin D deficiency    Wears hearing aid in both ears     Past Surgical History:  Procedure Laterality Date   ABLATION OF DYSRHYTHMIC FOCUS  08/29/2015   ATRIAL FIBRILLATION ABLATION  12/05/2016   ATRIAL FIBRILLATION ABLATION N/A 12/05/2016   Procedure: Atrial Fibrillation Ablation;  Surgeon: [name], MD;  Location: [place] [facility];  Service: Cardiovascular;  Laterality: N/A;   ATRIAL FIBRILLATION ABLATION N/A 02/29/2020   Procedure: ATRIAL FIBRILLATION ABLATION;  Surgeon: [name], MD;  Location: [place] [facility];  Service: Cardiovascular;  Laterality: N/A;   ATRIAL FLUTTER ABLATION  09/2010   by Glenford Bayley Right 1990s   CARDIOVERSION N/A 10/28/2012   Procedure: CARDIOVERSION;  Surgeon: Burnell Blanks, MD;  Location: [place];  Service: Cardiovascular;  Laterality: N/A;   CATARACT EXTRACTION W/ INTRAOCULAR LENS  IMPLANT, BILATERAL  2019   CYSTOSCOPY WITH BIOPSY N/A 09/03/2018   Procedure: CYSTOSCOPY WITH FULGURATION Fran Lowes;  Surgeon: Franchot Gallo, MD;  [facility];  Service: Urology;  Laterality: N/A;  30 MINS  ELECTROPHYSIOLOGIC STUDY N/A 08/29/2015   Procedure: Atrial Fibrillation Ablation;  Surgeon: [name], MD;  Location: [place] [facility];  Service: Cardiovascular;  Laterality: N/A;   KNEE SURGERY Bilateral x3   85 & 1970s   open   LEFT HEART CATHETERIZATION WITH CORONARY ANGIOGRAM N/A 06/07/2011   Procedure: LEFT HEART CATHETERIZATION WITH CORONARY ANGIOGRAM;  Surgeon: Peter M Martinique, MD;  Location: [facility] CATH LAB;  Service: Cardiovascular;  Laterality: N/A;    Normal coronary arteries,  low normal LVF   TONSILLECTOMY AND ADENOIDECTOMY  age 20   TOTAL HIP ARTHROPLASTY Right 06/04/10    dr Noemi Chapel  @[facility]    TOTAL KNEE ARTHROPLASTY Bilateral right 1995;  left 1997   TRANSURETHRAL RESECTION OF PROSTATE  06-29-2002   dr humphries  @[facility]    TRANSURETHRAL  RESECTION OF PROSTATE N/A 07/06/2018   Procedure: TRANSURETHRAL RESECTION OF THE PROSTATE (TURP);  Surgeon: Franchot Gallo, MD;  Location: [facility];  Service: Urology;  Laterality: N/A;  45 MINS    There were no vitals filed for this visit.   Subjective Assessment - 04/12/21 0934     Subjective I was tired but did okay    Currently in Pain? No/denies                               [facility] Adult PT Treatment/Exercise - 04/12/21 0001       Ambulation/Gait   Gait Comments gait around the back building with 2 rest breaks, c/o fatigue lungs and legs      Knee/Hip Exercises: Aerobic   Recumbent Bike level2 x 5 minutes    Nustep level 5 x 6 minutes      Knee/Hip Exercises: Machines for Strengthening   Cybex Knee Extension 5# 2x10    Cybex Knee Flexion 25# 2x10    Other Machine seated row, lats 20# 2x10                     PT Education - 04/12/21 0959     Education Details Patient brought in pictures of a gym that he is thinking of going to, started the talk of the machines and what to avoid    Person(s) Educated Patient    Methods Explanation;Demonstration    Comprehension Verbalized understanding              PT Short Term Goals - 04/12/21 1003       PT SHORT TERM GOAL #1   Title Pt will be I and compliant with initial HEP.    Status On-going               PT Long Term Goals - 04/03/21 1657       PT LONG TERM GOAL #1   Title Pt will be independent with long term HEP for improved posture.    Time 12    Period Weeks    Status New      PT LONG TERM GOAL #2   Title Pt will walk for 30 minutes with no report of lightheadedness, as needed for daily exercise and errands.    Period Weeks    Status New      PT LONG TERM GOAL #3   Title decrease TUG to 12 seconds    Time 12    Period Weeks    Status New      PT LONG TERM GOAL #4   Title walk 1000  feet without difficulty during 6 minute walk test    Time 12     Status New                   Plan - 04/12/21 1001     Clinical Impression Statement Patient brought in pix of the gym he is thinking of going to, we went over the names and the safety with them and which to avoid, we did a walk around the back building and due to fatigue needed two rest breaks    PT Next Visit Plan start to work on strength nd endurance    Consulted and Agree with Plan of Care Patient             Patient will benefit from skilled therapeutic intervention in order to improve the following deficits and impairments:  Decreased coordination, Decreased range of motion, Difficulty walking, Decreased endurance, Cardiopulmonary status limiting activity, Decreased activity tolerance, Decreased strength, Decreased mobility, Decreased balance  Visit Diagnosis: Muscle weakness (generalized)  Difficulty in walking, not elsewhere classified  Unsteadiness on feet  Cervicalgia  Cramp and spasm     Problem List Patient Active Problem List   Diagnosis Date Noted   Secondary hypercoagulable state ([place]) 03/28/2020   Malignant neoplasm of prostate ([place]) 09/11/2018   Enlarged prostate with urinary obstruction 07/06/2018   Gastrocnemius strain, right, initial encounter 12/10/2017   Baker's cyst of knee, right 12/10/2017   Obesity 03/24/2017   Paroxysmal atrial fibrillation ([place]) 12/05/2016   Near syncope 04/20/2016   Leg hematoma, left, initial encounter 04/20/2016   Bleeding    Ecchymosis    Left hamstring muscle strain    Muscle tear    RBBB 42/70/6237   Chronic systolic dysfunction of left ventricle 07/11/2014   OSA (obstructive sleep apnea) 04/07/2013   Multinodular goiter 01/20/2013   Cervical spondylosis without myelopathy 01/18/2013   Atrial fibrillation with RVR ([place]) 10/27/2012   Left ventricular dysfunction 12/17/2011   Low back pain 09/11/2011   Fatigue 05/22/2011   PVC (premature ventricular contraction) 01/18/2011   Persistent atrial  fibrillation ([place]) 01/12/2011   Hyperlipidemia 08/02/2010   Hypertension 08/02/2010   Osteoarthritis 08/02/2010   BPH (benign prostatic hyperplasia) 08/02/2010   GERD (gastroesophageal reflux disease) 08/02/2010   h/o Atrial flutter [facility]) s/p ablation 2012     Sumner Boast, PT 04/12/2021, 10:05 AM  [address]. [place], [place], [address] Phone: [phone]   Fax:  [phone]  Name: GERMAINE SHENKER MRN: [id number] Date of Birth: [birth year]

## 2021-04-16 ENCOUNTER — Encounter: Payer: Medicare PPO | Admitting: Physical Therapy

## 2021-04-17 ENCOUNTER — Other Ambulatory Visit: Payer: Self-pay

## 2021-04-17 ENCOUNTER — Ambulatory Visit: Payer: Medicare PPO | Admitting: Physical Therapy

## 2021-04-17 ENCOUNTER — Encounter: Payer: Self-pay | Admitting: Physical Therapy

## 2021-04-17 DIAGNOSIS — R262 Difficulty in walking, not elsewhere classified: Secondary | ICD-10-CM

## 2021-04-17 DIAGNOSIS — M6281 Muscle weakness (generalized): Secondary | ICD-10-CM | POA: Diagnosis not present

## 2021-04-17 DIAGNOSIS — R2681 Unsteadiness on feet: Secondary | ICD-10-CM | POA: Diagnosis not present

## 2021-04-17 DIAGNOSIS — R252 Cramp and spasm: Secondary | ICD-10-CM | POA: Diagnosis not present

## 2021-04-17 DIAGNOSIS — M542 Cervicalgia: Secondary | ICD-10-CM | POA: Diagnosis not present

## 2021-04-17 NOTE — Therapy (Signed)
[place] [place], [place], [address] Phone: [phone]   Fax:  [phone]  Physical Therapy Treatment  Patient Details  Name: Connor [name] MRN: [id number] Date of Birth: 01/18/1934 Referring Provider (PT): Copland   Encounter Date: 04/17/2021   PT End of Session - 04/17/21 1141     Visit Number 4    Number of Visits 12    Date for PT Re-Evaluation 05/18/21    Authorization Type [employer]    PT Start Time 1011    PT Stop Time 1055    PT Time Calculation (min) 44 min    Activity Tolerance Patient limited by pain             Past Medical History:  Diagnosis Date   Benign localized prostatic hyperplasia with lower urinary tract symptoms (LUTS)    urologist-- dr Diona Fanti   Chronic anticoagulation    Xarelto for afib   Chronic constipation    Chronic cystitis    Gross hematuria    History of DVT of lower extremity 06/08/2010   right femoral dvt dx post op total hip 05-08-2010   History of gastric ulcer    remote   History of kidney stones    one stone. remote hx   Hyperlipidemia    Hypertension    Dx by Dr. Lance Muss around age  87    NICM (nonischemic cardiomyopathy) ([place])    per echo 06-11-2017,  ef 45-50% with diffuse hypokinesis,  G1DD   OSA on CPAP 2017   compliant with CPAP.  managed by Dr Elsworth Soho.   Osteoarthritis    Other urethral stricture, male, meatal    chronic;  pt self dilates   Paroxysmal atrial fibrillation [employer]) cardiologist-- dr Rayann Heman   first dx 2009/   a. documented by Dr Barrett Shell on office EKG 9/12. b. maintained on Tikosyn and Xarelto. c. s/p DCCV 's.  d.  x3  EP w/ ablation atrial fib last one 12-05-2016   Prediabetes    Premature ventricular contraction    PVC's (premature ventricular contractions)    RA (rheumatoid arthritis) ([place])    rheumatologist--- Dr Page Spiro,  treated with Humira   RBBB (right bundle branch block)    Hx of a fib, ablation Aug 2018    Rheumatoid arthritis ([place])    Vitamin D deficiency    Wears hearing aid in both ears     Past Surgical History:  Procedure Laterality Date   ABLATION OF DYSRHYTHMIC FOCUS  08/29/2015   ATRIAL FIBRILLATION ABLATION  12/05/2016   ATRIAL FIBRILLATION ABLATION N/A 12/05/2016   Procedure: Atrial Fibrillation Ablation;  Surgeon: [name], MD;  Location: [facility];  Service: Cardiovascular;  Laterality: N/A;   ATRIAL FIBRILLATION ABLATION N/A 02/29/2020   Procedure: ATRIAL FIBRILLATION ABLATION;  Surgeon: [name], MD;  Location: [facility];  Service: Cardiovascular;  Laterality: N/A;   ATRIAL FLUTTER ABLATION  09/2010   by Glenford Bayley Right 1990s   CARDIOVERSION N/A 10/28/2012   Procedure: CARDIOVERSION;  Surgeon: Burnell Blanks, MD;  Location: [facility];  Service: Cardiovascular;  Laterality: N/A;   CATARACT EXTRACTION W/ INTRAOCULAR LENS  IMPLANT, BILATERAL  2019   CYSTOSCOPY WITH BIOPSY N/A 09/03/2018   Procedure: CYSTOSCOPY WITH FULGURATION Fran Lowes;  Surgeon: Franchot Gallo, MD;  Location: [facility];  Service: Urology;  Laterality: N/A;  30 MINS   ELECTROPHYSIOLOGIC STUDY N/A 08/29/2015   Procedure: Atrial Fibrillation  Ablation;  Surgeon: [name], MD;  Location: [facility];  Service: Cardiovascular;  Laterality: N/A;   KNEE SURGERY Bilateral x3   1960s & 1970s   open   LEFT HEART CATHETERIZATION WITH CORONARY ANGIOGRAM N/A 06/07/2011   Procedure: LEFT HEART CATHETERIZATION WITH CORONARY ANGIOGRAM;  Surgeon: Peter M Martinique, MD;  Location: [facility];  Service: Cardiovascular;  Laterality: N/A;    Normal coronary arteries,  low normal LVF   TONSILLECTOMY AND ADENOIDECTOMY  age 87   TOTAL HIP ARTHROPLASTY Right 06/04/10    dr Noemi Chapel  '@[facility]'    TOTAL KNEE ARTHROPLASTY Bilateral right 1995;  left [place]  06-29-2002   dr humphries  '@[facility]'    TRANSURETHRAL RESECTION OF PROSTATE N/A 07/06/2018   Procedure: TRANSURETHRAL  RESECTION OF THE PROSTATE (TURP);  Surgeon: Franchot Gallo, MD;  Location: [facility];  Service: Urology;  Laterality: N/A;  45 MINS    There were no vitals filed for this visit.   Subjective Assessment - 04/17/21 1015     Subjective Patient reports that after the last vist hehad some left hip pain after getting out of car , reports that he still feels some pain but not sharp    Currently in Pain? Yes    Pain Score 3     Pain Location Hip    Pain Orientation Left    Pain Descriptors / Indicators Sore    Pain Type Acute pain    Aggravating Factors  getting out of car                               [facility] Adult PT Treatment/Exercise - 04/17/21 0001       High Level Balance   High Level Balance Activities Side stepping    High Level Balance Comments cone toe touches, on airex ball toss, walking ball toss, sidestepping over objects, stepping on and off airex      Knee/Hip Exercises: Stretches   Passive Hamstring Stretch Left;4 reps;20 seconds    Piriformis Stretch Left;4 reps;20 seconds      Knee/Hip Exercises: Standing   Hip Flexion Both;2 sets;10 reps    Hip Flexion Limitations 3#    Hip Abduction Both;2 sets;10 reps    Abduction Limitations 3#    Hip Extension Both;2 sets;10 reps    Extension Limitations 3#      Knee/Hip Exercises: Seated   Long Arc Quad Both;2 sets;10 reps    Long Arc Quad Weight 3 lbs.      Knee/Hip Exercises: Supine   Other Supine Knee/Hip Exercises feet on ball K2C, trunk rotation, small bridges and isometric abs                       PT Short Term Goals - 04/17/21 1143       PT SHORT TERM GOAL #1   Title Pt will be I and compliant with initial HEP.    Status Partially Met               PT Long Term Goals - 04/17/21 1144       PT LONG TERM GOAL #1   Title Pt will be independent with long term HEP for improved posture.    Status On-going                   Plan - 04/17/21  1142  Clinical Impression Statement Patient reports left buttock pain, not sure what the issue was but reports after the last PT treatment, i did not see anything new that I added, but he is very tight and sore in the left piriformis area.  With high level balance, he is very fearful of the dynamic surface standing, but once he gets going he feels better    PT Next Visit Plan see how his hip is doing and progress as tolerated    Consulted and Agree with Plan of Care Patient             Patient will benefit from skilled therapeutic intervention in order to improve the following deficits and impairments:  Decreased coordination, Decreased range of motion, Difficulty walking, Decreased endurance, Cardiopulmonary status limiting activity, Decreased activity tolerance, Decreased strength, Decreased mobility, Decreased balance  Visit Diagnosis: Muscle weakness (generalized)  Difficulty in walking, not elsewhere classified  Unsteadiness on feet     Problem List Patient Active Problem List   Diagnosis Date Noted   Secondary hypercoagulable state ([place]) 03/28/2020   Malignant neoplasm of prostate ([place]) 09/11/2018   Enlarged prostate with urinary obstruction 07/06/2018   Gastrocnemius strain, right, initial encounter 12/10/2017   Baker's cyst of knee, right 12/10/2017   Obesity 03/24/2017   Paroxysmal atrial fibrillation ([place]) 12/05/2016   Near syncope 04/20/2016   Leg hematoma, left, initial encounter 04/20/2016   Bleeding    Ecchymosis    Left hamstring muscle strain    Muscle tear    RBBB 36/04/2448   Chronic systolic dysfunction of left ventricle 07/11/2014   OSA (obstructive sleep apnea) 04/07/2013   Multinodular goiter 01/20/2013   Cervical spondylosis without myelopathy 01/18/2013   Atrial fibrillation with RVR ([place]) 10/27/2012   Left ventricular dysfunction 12/17/2011   Low back pain 09/11/2011   Fatigue 05/22/2011   PVC (premature ventricular contraction) 01/18/2011    Persistent atrial fibrillation ([place]) 01/12/2011   Hyperlipidemia 08/02/2010   Hypertension 08/02/2010   Osteoarthritis 08/02/2010   BPH (benign prostatic hyperplasia) 08/02/2010   GERD (gastroesophageal reflux disease) 08/02/2010   h/o Atrial flutter [facility]) s/p ablation 2012     Sumner Boast, PT 04/17/2021, 11:44 AM  [place]. [place], [place], [address] Phone: [phone]   Fax:  [phone]  Name: ZYRON DEELEY MRN: [id number] Date of Birth: [birth year]

## 2021-04-19 ENCOUNTER — Ambulatory Visit: Payer: Medicare PPO | Admitting: Physical Therapy

## 2021-04-19 ENCOUNTER — Encounter: Payer: Self-pay | Admitting: Physical Therapy

## 2021-04-19 ENCOUNTER — Other Ambulatory Visit: Payer: Self-pay

## 2021-04-19 DIAGNOSIS — M6281 Muscle weakness (generalized): Secondary | ICD-10-CM

## 2021-04-19 DIAGNOSIS — M542 Cervicalgia: Secondary | ICD-10-CM | POA: Diagnosis not present

## 2021-04-19 DIAGNOSIS — R2681 Unsteadiness on feet: Secondary | ICD-10-CM

## 2021-04-19 DIAGNOSIS — R262 Difficulty in walking, not elsewhere classified: Secondary | ICD-10-CM | POA: Diagnosis not present

## 2021-04-19 DIAGNOSIS — R252 Cramp and spasm: Secondary | ICD-10-CM | POA: Diagnosis not present

## 2021-04-19 NOTE — Therapy (Signed)
[place]. [place], [place], [address] Phone: [phone]   Fax:  [phone]  Physical Therapy Treatment  Patient Details  Name: Connor [name] MRN: [id number] Date of Birth: 16-Jul-1933 Referring Provider (PT): Copland   Encounter Date: 04/19/2021   PT End of Session - 04/19/21 1359     Visit Number 5    Number of Visits 12    Date for PT Re-Evaluation 05/18/21    Authorization Type [employer]    PT Start Time 1310    PT Stop Time 6629    PT Time Calculation (min) 47 min    Activity Tolerance Patient limited by pain    Behavior During Therapy [facility] for tasks assessed/performed             Past Medical History:  Diagnosis Date   Benign localized prostatic hyperplasia with lower urinary tract symptoms (LUTS)    urologist-- dr Diona Fanti   Chronic anticoagulation    Xarelto for afib   Chronic constipation    Chronic cystitis    Gross hematuria    History of DVT of lower extremity 06/08/2010   right femoral dvt dx post op total hip 05-08-2010   History of gastric ulcer    remote   History of kidney stones    one stone. remote hx   Hyperlipidemia    Hypertension    Dx by Dr. Lance Muss around age  38    NICM (nonischemic cardiomyopathy) ([place])    per echo 06-11-2017,  ef 45-50% with diffuse hypokinesis,  G1DD   OSA on CPAP 2017   compliant with CPAP.  managed by Dr Elsworth Soho.   Osteoarthritis    Other urethral stricture, male, meatal    chronic;  pt self dilates   Paroxysmal atrial fibrillation [facility]) cardiologist-- dr Rayann Heman   first dx 2009/   a. documented by Dr Barrett Shell on office EKG 9/12. b. maintained on Tikosyn and Xarelto. c. s/p DCCV 's.  d.  x3  EP w/ ablation atrial fib last one 12-05-2016   Prediabetes    Premature ventricular contraction    PVC's (premature ventricular contractions)    RA (rheumatoid arthritis) ([place])    rheumatologist--- Dr Page Spiro,  treated with Humira   RBBB (right  bundle branch block)    Hx of a fib, ablation Aug 2018   Rheumatoid arthritis ([place])    Vitamin D deficiency    Wears hearing aid in both ears     Past Surgical History:  Procedure Laterality Date   ABLATION OF DYSRHYTHMIC FOCUS  08/29/2015   ATRIAL FIBRILLATION ABLATION  12/05/2016   ATRIAL FIBRILLATION ABLATION N/A 12/05/2016   Procedure: Atrial Fibrillation Ablation;  Surgeon: [name], MD;  Location: [place] [facility];  Service: Cardiovascular;  Laterality: N/A;   ATRIAL FIBRILLATION ABLATION N/A 02/29/2020   Procedure: ATRIAL FIBRILLATION ABLATION;  Surgeon: [name], MD;  Location: [place] [facility];  Service: Cardiovascular;  Laterality: N/A;   ATRIAL FLUTTER ABLATION  09/2010   by Glenford Bayley Right 1990s   CARDIOVERSION N/A 10/28/2012   Procedure: CARDIOVERSION;  Surgeon: Burnell Blanks, MD;  Location: [facility];  Service: Cardiovascular;  Laterality: N/A;   CATARACT EXTRACTION W/ INTRAOCULAR LENS  IMPLANT, BILATERAL  2019   CYSTOSCOPY WITH BIOPSY N/A 09/03/2018   Procedure: CYSTOSCOPY WITH FULGURATION Fran Lowes;  Surgeon: Franchot Gallo, MD;  [facility];  Service: Urology;  Laterality: N/A;  30 MINS  ELECTROPHYSIOLOGIC STUDY N/A 08/29/2015   Procedure: Atrial Fibrillation Ablation;  Surgeon: [name], MD;  Location: [place] [facility];  Service: Cardiovascular;  Laterality: N/A;   KNEE SURGERY Bilateral x3   1960s & 1970s   open   LEFT HEART CATHETERIZATION WITH CORONARY ANGIOGRAM N/A 06/07/2011   Procedure: LEFT HEART CATHETERIZATION WITH CORONARY ANGIOGRAM;  Surgeon: Peter M Martinique, MD;  Location: [facility];  Service: Cardiovascular;  Laterality: N/A;    Normal coronary arteries,  low normal LVF   TONSILLECTOMY AND ADENOIDECTOMY  age 87   TOTAL HIP ARTHROPLASTY Right 06/04/10    dr Noemi Chapel  _0    TOTAL KNEE ARTHROPLASTY Bilateral right 1995;  left [place]  06-29-2002   dr humphries  _1    TRANSURETHRAL  RESECTION OF PROSTATE N/A 07/06/2018   Procedure: TRANSURETHRAL RESECTION OF THE PROSTATE (TURP);  Surgeon: Franchot Gallo, MD;  Location: [facility];  Service: Urology;  Laterality: N/A;  45 MINS    There were no vitals filed for this visit.   Subjective Assessment - 04/19/21 1313     Subjective Patient reports that the hip felt better after the last treatment    Currently in Pain? Yes    Pain Score 2     Pain Location Hip    Pain Orientation Left    Pain Descriptors / Indicators Sore;Tightness    Pain Relieving Factors the stretches seemed to help                               [facility] Adult PT Treatment/Exercise - 04/19/21 0001       High Level Balance   High Level Balance Activities Side stepping;Backward walking;Tandem walking;Negotiating over obstacles    High Level Balance Comments cone toe touches, on airex ball toss, walking ball toss, sidestepping over objects, stepping on and off airex, ball kicks      Knee/Hip Exercises: Stretches   Passive Hamstring Stretch Left;4 reps;20 seconds    Piriformis Stretch Left;4 reps;20 seconds    Gastroc Stretch Both;3 reps;20 seconds      Knee/Hip Exercises: Aerobic   Recumbent Bike level2 x 5 minutes    Nustep level 5 x 6 minutes      Knee/Hip Exercises: Machines for Strengthening   Other Machine seated row, lats 20# 2x10      Knee/Hip Exercises: Standing   Hip Flexion Both;2 sets;10 reps    Hip Flexion Limitations 3#    Hip Abduction Both;2 sets;10 reps    Abduction Limitations 3#    Hip Extension Both;2 sets;10 reps    Extension Limitations 3#      Knee/Hip Exercises: Supine   Other Supine Knee/Hip Exercises feet on ball K2C, trunk rotation, small bridges and isometric abs                       PT Short Term Goals - 04/17/21 1143       PT SHORT TERM GOAL #1   Title Pt will be I and compliant with initial HEP.    Status Partially Met               PT Long Term  Goals - 04/17/21 1144       PT LONG TERM GOAL #1   Title Pt will be independent with long term HEP for improved posture.    Status On-going  Plan - 04/19/21 1400     Clinical Impression Statement Still some left hip pain, very painful and tight left piriformis stretch.  He did well with the balance on solid surfaces, difficulty with tandem and on dynamic surfaces.  The left hip was causing him to have some limp today    PT Next Visit Plan be careful with the left hip    Consulted and Agree with Plan of Care Patient             Patient will benefit from skilled therapeutic intervention in order to improve the following deficits and impairments:  Decreased coordination, Decreased range of motion, Difficulty walking, Decreased endurance, Cardiopulmonary status limiting activity, Decreased activity tolerance, Decreased strength, Decreased mobility, Decreased balance  Visit Diagnosis: Muscle weakness (generalized)  Difficulty in walking, not elsewhere classified  Unsteadiness on feet  Cervicalgia     Problem List Patient Active Problem List   Diagnosis Date Noted   Secondary hypercoagulable state ([place]) 03/28/2020   Malignant neoplasm of prostate ([place]) 09/11/2018   Enlarged prostate with urinary obstruction 07/06/2018   Gastrocnemius strain, right, initial encounter 12/10/2017   Baker's cyst of knee, right 12/10/2017   Obesity 03/24/2017   Paroxysmal atrial fibrillation ([place]) 12/05/2016   Near syncope 04/20/2016   Leg hematoma, left, initial encounter 04/20/2016   Bleeding    Ecchymosis    Left hamstring muscle strain    Muscle tear    RBBB 16/02/9603   Chronic systolic dysfunction of left ventricle 07/11/2014   OSA (obstructive sleep apnea) 04/07/2013   Multinodular goiter 01/20/2013   Cervical spondylosis without myelopathy 01/18/2013   Atrial fibrillation with RVR ([place]) 10/27/2012   Left ventricular dysfunction 12/17/2011   Low back  pain 09/11/2011   Fatigue 05/22/2011   PVC (premature ventricular contraction) 01/18/2011   Persistent atrial fibrillation ([place]) 01/12/2011   Hyperlipidemia 08/02/2010   Hypertension 08/02/2010   Osteoarthritis 08/02/2010   BPH (benign prostatic hyperplasia) 08/02/2010   GERD (gastroesophageal reflux disease) 08/02/2010   h/o Atrial flutter [facility]) s/p ablation 2012     Sumner Boast, PT 04/19/2021, 2:17 PM  [place]. [place], [place], [address] Phone: [phone]   Fax:  [phone]  Name: Connor [name] MRN: [id number] Date of Birth: 1933-05-17

## 2021-04-20 DIAGNOSIS — H353132 Nonexudative age-related macular degeneration, bilateral, intermediate dry stage: Secondary | ICD-10-CM | POA: Diagnosis not present

## 2021-04-20 DIAGNOSIS — H35033 Hypertensive retinopathy, bilateral: Secondary | ICD-10-CM | POA: Diagnosis not present

## 2021-04-20 DIAGNOSIS — H40013 Open angle with borderline findings, low risk, bilateral: Secondary | ICD-10-CM | POA: Diagnosis not present

## 2021-04-20 DIAGNOSIS — H35013 Changes in retinal vascular appearance, bilateral: Secondary | ICD-10-CM | POA: Diagnosis not present

## 2021-04-24 ENCOUNTER — Encounter: Payer: Self-pay | Admitting: Physical Therapy

## 2021-04-24 ENCOUNTER — Ambulatory Visit: Payer: Medicare PPO | Admitting: Physical Therapy

## 2021-04-24 ENCOUNTER — Other Ambulatory Visit: Payer: Self-pay

## 2021-04-24 DIAGNOSIS — M6281 Muscle weakness (generalized): Secondary | ICD-10-CM

## 2021-04-24 DIAGNOSIS — R2681 Unsteadiness on feet: Secondary | ICD-10-CM

## 2021-04-24 DIAGNOSIS — R262 Difficulty in walking, not elsewhere classified: Secondary | ICD-10-CM

## 2021-04-24 DIAGNOSIS — R252 Cramp and spasm: Secondary | ICD-10-CM | POA: Diagnosis not present

## 2021-04-24 DIAGNOSIS — M542 Cervicalgia: Secondary | ICD-10-CM | POA: Diagnosis not present

## 2021-04-24 NOTE — Therapy (Signed)
[place]. [place], [place], [address] Phone: [phone]   Fax:  [phone]  Physical Therapy Treatment  Patient Details  Name: Connor [name] MRN: [id number] Date of Birth: April 11, 1934 Referring Provider (PT): Copland   Encounter Date: 04/24/2021   PT End of Session - 04/24/21 1051     Visit Number 6    Number of Visits 12    Date for PT Re-Evaluation 05/18/21    Authorization Type [employer]    PT Start Time 1013    PT Stop Time 1100    PT Time Calculation (min) 47 min    Activity Tolerance Patient tolerated treatment well    Behavior During Therapy [facility] for tasks assessed/performed             Past Medical History:  Diagnosis Date   Benign localized prostatic hyperplasia with lower urinary tract symptoms (LUTS)    urologist-- dr Diona Fanti   Chronic anticoagulation    Xarelto for afib   Chronic constipation    Chronic cystitis    Gross hematuria    History of DVT of lower extremity 06/08/2010   right femoral dvt dx post op total hip 05-08-2010   History of gastric ulcer    remote   History of kidney stones    one stone. remote hx   Hyperlipidemia    Hypertension    Dx by Dr. Lance Muss around age  81    NICM (nonischemic cardiomyopathy) ([place])    per echo 06-11-2017,  ef 45-50% with diffuse hypokinesis,  G1DD   OSA on CPAP 2017   compliant with CPAP.  managed by Dr Elsworth Soho.   Osteoarthritis    Other urethral stricture, male, meatal    chronic;  pt self dilates   Paroxysmal atrial fibrillation [facility]) cardiologist-- dr Rayann Heman   first dx 2009/   a. documented by Dr Barrett Shell on office EKG 9/12. b. maintained on Tikosyn and Xarelto. c. s/p DCCV 's.  d.  x3  EP w/ ablation atrial fib last one 12-05-2016   Prediabetes    Premature ventricular contraction    PVC's (premature ventricular contractions)    RA (rheumatoid arthritis) ([place])    rheumatologist--- Dr Page Spiro,  treated with Humira   RBBB  (right bundle branch block)    Hx of a fib, ablation Aug 2018   Rheumatoid arthritis ([place])    Vitamin D deficiency    Wears hearing aid in both ears     Past Surgical History:  Procedure Laterality Date   ABLATION OF DYSRHYTHMIC FOCUS  08/29/2015   ATRIAL FIBRILLATION ABLATION  12/05/2016   ATRIAL FIBRILLATION ABLATION N/A 12/05/2016   Procedure: Atrial Fibrillation Ablation;  Surgeon: [name], MD;  Location: [place] CV LAB;  Service: Cardiovascular;  Laterality: N/A;   ATRIAL FIBRILLATION ABLATION N/A 02/29/2020   Procedure: ATRIAL FIBRILLATION ABLATION;  Surgeon: [name], MD;  Location: [place] CV LAB;  Service: Cardiovascular;  Laterality: N/A;   ATRIAL FLUTTER ABLATION  09/2010   by Glenford Bayley Right 1990s   CARDIOVERSION N/A 10/28/2012   Procedure: CARDIOVERSION;  Surgeon: Burnell Blanks, MD;  Location: [place];  Service: Cardiovascular;  Laterality: N/A;   CATARACT EXTRACTION W/ INTRAOCULAR LENS  IMPLANT, BILATERAL  2019   CYSTOSCOPY WITH BIOPSY N/A 09/03/2018   Procedure: CYSTOSCOPY WITH FULGURATION Fran Lowes;  Surgeon: Franchot Gallo, MD;  [facility];  Service: Urology;  Laterality: N/A;  30 MINS  ELECTROPHYSIOLOGIC STUDY N/A 08/29/2015   Procedure: Atrial Fibrillation Ablation;  Surgeon: [name], MD;  Location: [place] CV LAB;  Service: Cardiovascular;  Laterality: N/A;   KNEE SURGERY Bilateral x3   1960s & 1970s   open   LEFT HEART CATHETERIZATION WITH CORONARY ANGIOGRAM N/A 06/07/2011   Procedure: LEFT HEART CATHETERIZATION WITH CORONARY ANGIOGRAM;  Surgeon: Peter M Martinique, MD;  Location: [facility] CATH LAB;  Service: Cardiovascular;  Laterality: N/A;    Normal coronary arteries,  low normal LVF   TONSILLECTOMY AND ADENOIDECTOMY  age 89   TOTAL HIP ARTHROPLASTY Right 06/04/10    dr Noemi Chapel  _0    TOTAL KNEE ARTHROPLASTY Bilateral right 1995;  left [place]  06-29-2002   dr humphries  _1    TRANSURETHRAL  RESECTION OF PROSTATE N/A 07/06/2018   Procedure: TRANSURETHRAL RESECTION OF THE PROSTATE (TURP);  Surgeon: Franchot Gallo, MD;  Location: [facility];  Service: Urology;  Laterality: N/A;  45 MINS    There were no vitals filed for this visit.   Subjective Assessment - 04/24/21 1019     Subjective Reports still hurting in the left hip, worse after sitting and then getting up to walk, unsure of a xause    Currently in Pain? Yes    Pain Score 2     Pain Location Hip    Pain Orientation Left    Pain Descriptors / Indicators Sharp    Aggravating Factors  sharp up to 8/10                               [facility] Adult PT Treatment/Exercise - 04/24/21 0001       High Level Balance   High Level Balance Activities Side stepping;Backward walking;Tandem walking;Negotiating over obstacles    High Level Balance Comments cone toe touches, on airex ball toss, walking ball toss, sidestepping over objects, stepping on and off airex, ball kicks, in pbars airex balance beam side stepping and tandem walking      Knee/Hip Exercises: Aerobic   Nustep level 5 x 6 minutes      Knee/Hip Exercises: Machines for Strengthening   Cybex Knee Extension 5# 2x10    Cybex Knee Flexion 25# 2x10      Knee/Hip Exercises: Supine   Other Supine Knee/Hip Exercises feet on ball K2C, trunk rotation, small bridges and isometric abs                       PT Short Term Goals - 04/17/21 1143       PT SHORT TERM GOAL #1   Title Pt will be I and compliant with initial HEP.    Status Partially Met               PT Long Term Goals - 04/17/21 1144       PT LONG TERM GOAL #1   Title Pt will be independent with long term HEP for improved posture.    Status On-going                   Plan - 04/24/21 1052     Clinical Impression Statement Patient continues to have some left hip pain, I tried to not do any stretches or any specific exercises on the hip to  see if this could alleviate the pain, he had no issues with any thing we did, he really  struggles on the tandem gait and the dynamic surfaces.  The last time i saw him i thought the piriformis stretch irritated the hip so I did not attmept any stretches today to see if the hip pain resolves.    PT Next Visit Plan see how the hip is doing. work on balance and him going to gym on his own    Consulted and Agree with Plan of Care Patient             Patient will benefit from skilled therapeutic intervention in order to improve the following deficits and impairments:  Decreased coordination, Decreased range of motion, Difficulty walking, Decreased endurance, Cardiopulmonary status limiting activity, Decreased activity tolerance, Decreased strength, Decreased mobility, Decreased balance  Visit Diagnosis: Muscle weakness (generalized)  Difficulty in walking, not elsewhere classified  Unsteadiness on feet     Problem List Patient Active Problem List   Diagnosis Date Noted   Secondary hypercoagulable state ([place]) 03/28/2020   Malignant neoplasm of prostate ([place]) 09/11/2018   Enlarged prostate with urinary obstruction 07/06/2018   Gastrocnemius strain, right, initial encounter 12/10/2017   Baker's cyst of knee, right 12/10/2017   Obesity 03/24/2017   Paroxysmal atrial fibrillation ([place]) 12/05/2016   Near syncope 04/20/2016   Leg hematoma, left, initial encounter 04/20/2016   Bleeding    Ecchymosis    Left hamstring muscle strain    Muscle tear    RBBB 36/43/8377   Chronic systolic dysfunction of left ventricle 07/11/2014   OSA (obstructive sleep apnea) 04/07/2013   Multinodular goiter 01/20/2013   Cervical spondylosis without myelopathy 01/18/2013   Atrial fibrillation with RVR ([place]) 10/27/2012   Left ventricular dysfunction 12/17/2011   Low back pain 09/11/2011   Fatigue 05/22/2011   PVC (premature ventricular contraction) 01/18/2011   Persistent atrial fibrillation ([place])  01/12/2011   Hyperlipidemia 08/02/2010   Hypertension 08/02/2010   Osteoarthritis 08/02/2010   BPH (benign prostatic hyperplasia) 08/02/2010   GERD (gastroesophageal reflux disease) 08/02/2010   h/o Atrial flutter [facility]) s/p ablation 2012     Sumner Boast, PT 04/24/2021, 11:04 AM  [place]. [place], [place], [address] Phone: [phone]   Fax:  [phone]  Name: ISIDORE MARGRAF MRN: [id number] Date of Birth: Sep 19, 1933

## 2021-04-26 ENCOUNTER — Other Ambulatory Visit: Payer: Self-pay

## 2021-04-26 ENCOUNTER — Encounter: Payer: Self-pay | Admitting: Physical Therapy

## 2021-04-26 ENCOUNTER — Ambulatory Visit: Payer: Medicare PPO | Admitting: Physical Therapy

## 2021-04-26 DIAGNOSIS — M6281 Muscle weakness (generalized): Secondary | ICD-10-CM

## 2021-04-26 DIAGNOSIS — R2681 Unsteadiness on feet: Secondary | ICD-10-CM | POA: Diagnosis not present

## 2021-04-26 DIAGNOSIS — E669 Obesity, unspecified: Secondary | ICD-10-CM | POA: Diagnosis not present

## 2021-04-26 DIAGNOSIS — R252 Cramp and spasm: Secondary | ICD-10-CM | POA: Diagnosis not present

## 2021-04-26 DIAGNOSIS — R262 Difficulty in walking, not elsewhere classified: Secondary | ICD-10-CM | POA: Diagnosis not present

## 2021-04-26 DIAGNOSIS — Z79899 Other long term (current) drug therapy: Secondary | ICD-10-CM | POA: Diagnosis not present

## 2021-04-26 DIAGNOSIS — M2550 Pain in unspecified joint: Secondary | ICD-10-CM | POA: Diagnosis not present

## 2021-04-26 DIAGNOSIS — Z6830 Body mass index (BMI) 30.0-30.9, adult: Secondary | ICD-10-CM | POA: Diagnosis not present

## 2021-04-26 DIAGNOSIS — M150 Primary generalized (osteo)arthritis: Secondary | ICD-10-CM | POA: Diagnosis not present

## 2021-04-26 DIAGNOSIS — M542 Cervicalgia: Secondary | ICD-10-CM | POA: Diagnosis not present

## 2021-04-26 DIAGNOSIS — M0600 Rheumatoid arthritis without rheumatoid factor, unspecified site: Secondary | ICD-10-CM | POA: Diagnosis not present

## 2021-04-26 NOTE — Therapy (Signed)
[place]. [place], [place], [address] Phone: [phone]   Fax:  [phone]  Physical Therapy Treatment  Patient Details  Name: Connor [name] MRN: [id number] Date of Birth: [birth year] Referring Provider (PT): Copland   Encounter Date: 04/26/2021   PT End of Session - 04/26/21 1437     Visit Number 7    Number of Visits 12    Date for PT Re-Evaluation 05/18/21    Authorization Type [employer]    PT Start Time 6967    PT Stop Time 8938    PT Time Calculation (min) 41 min    Activity Tolerance Patient tolerated treatment well    Behavior During Therapy [facility] for tasks assessed/performed             Past Medical History:  Diagnosis Date   Benign localized prostatic hyperplasia with lower urinary tract symptoms (LUTS)    urologist-- dr Diona Fanti   Chronic anticoagulation    Xarelto for afib   Chronic constipation    Chronic cystitis    Gross hematuria    History of DVT of lower extremity 06/08/2010   right femoral dvt dx post op total hip 05-08-2010   History of gastric ulcer    remote   History of kidney stones    one stone. remote hx   Hyperlipidemia    Hypertension    Dx by Dr. Lance Muss around age  69    NICM (nonischemic cardiomyopathy) ([place])    per echo 06-11-2017,  ef 45-50% with diffuse hypokinesis,  G1DD   OSA on CPAP 2017   compliant with CPAP.  managed by Dr Elsworth Soho.   Osteoarthritis    Other urethral stricture, male, meatal    chronic;  pt self dilates   Paroxysmal atrial fibrillation [facility]) cardiologist-- dr Rayann Heman   first dx 2009/   a. documented by Dr Barrett Shell on office EKG 9/12. b. maintained on Tikosyn and Xarelto. c. s/p DCCV 's.  d.  x3  EP w/ ablation atrial fib last one 12-05-2016   Prediabetes    Premature ventricular contraction    PVC's (premature ventricular contractions)    RA (rheumatoid arthritis) ([place])    rheumatologist--- Dr Page Spiro,  treated with Humira   RBBB  (right bundle branch block)    Hx of a fib, ablation Aug 2018   Rheumatoid arthritis ([place])    Vitamin D deficiency    Wears hearing aid in both ears     Past Surgical History:  Procedure Laterality Date   ABLATION OF DYSRHYTHMIC FOCUS  08/29/2015   ATRIAL FIBRILLATION ABLATION  12/05/2016   ATRIAL FIBRILLATION ABLATION N/A 12/05/2016   Procedure: Atrial Fibrillation Ablation;  Surgeon: [name], MD;  Location: [place] CV LAB;  Service: Cardiovascular;  Laterality: N/A;   ATRIAL FIBRILLATION ABLATION N/A 02/29/2020   Procedure: ATRIAL FIBRILLATION ABLATION;  Surgeon: [name], MD;  Location: [place] CV LAB;  Service: Cardiovascular;  Laterality: N/A;   ATRIAL FLUTTER ABLATION  09/2010   by Glenford Bayley Right 1990s   CARDIOVERSION N/A 10/28/2012   Procedure: CARDIOVERSION;  Surgeon: Burnell Blanks, MD;  Location: [place];  Service: Cardiovascular;  Laterality: N/A;   CATARACT EXTRACTION W/ INTRAOCULAR LENS  IMPLANT, BILATERAL  2019   CYSTOSCOPY WITH BIOPSY N/A 09/03/2018   Procedure: CYSTOSCOPY WITH FULGURATION Fran Lowes;  Surgeon: Franchot Gallo, MD;  [facility];  Service: Urology;  Laterality: N/A;  30 MINS  ELECTROPHYSIOLOGIC STUDY N/A 08/29/2015   Procedure: Atrial Fibrillation Ablation;  Surgeon: [name], MD;  Location: [place] CV LAB;  Service: Cardiovascular;  Laterality: N/A;   KNEE SURGERY Bilateral x3   1960s & 1970s   open   LEFT HEART CATHETERIZATION WITH CORONARY ANGIOGRAM N/A 06/07/2011   Procedure: LEFT HEART CATHETERIZATION WITH CORONARY ANGIOGRAM;  Surgeon: Peter M Martinique, MD;  Location: [facility] CATH LAB;  Service: Cardiovascular;  Laterality: N/A;    Normal coronary arteries,  low normal LVF   TONSILLECTOMY AND ADENOIDECTOMY  age 87   TOTAL HIP ARTHROPLASTY Right 06/04/10    dr Noemi Chapel  '@[facility]'    TOTAL KNEE ARTHROPLASTY Bilateral right 1995;  left [place]  06-29-2002   dr humphries  '@[facility]'    TRANSURETHRAL  RESECTION OF PROSTATE N/A 07/06/2018   Procedure: TRANSURETHRAL RESECTION OF THE PROSTATE (TURP);  Surgeon: Franchot Gallo, MD;  Location: [facility];  Service: Urology;  Laterality: N/A;  45 MINS    There were no vitals filed for this visit.   Subjective Assessment - 04/26/21 1400     Subjective My hip felt better after the last treatment    Currently in Pain? No/denies                               [facility] Adult PT Treatment/Exercise - 04/26/21 0001       Knee/Hip Exercises: Aerobic   Recumbent Bike level2 x 5 minutes    Nustep level 5 x 6 minutes      Knee/Hip Exercises: Machines for Strengthening   Cybex Knee Extension 5# 3x10    Cybex Knee Flexion 25# 3x10                       PT Short Term Goals - 04/17/21 1143       PT SHORT TERM GOAL #1   Title Pt will be I and compliant with initial HEP.    Status Partially Met               PT Long Term Goals - 04/26/21 1439       PT LONG TERM GOAL #1   Title Pt will be independent with long term HEP for improved posture.    Status On-going      PT LONG TERM GOAL #2   Title Pt will walk for 30 minutes with no report of lightheadedness, as needed for daily exercise and errands.    Status Partially Met      PT LONG TERM GOAL #3   Title decrease TUG to 12 seconds    Status On-going      PT LONG TERM GOAL #4   Title walk 1000 feet without difficulty during 6 minute walk test    Status On-going                   Plan - 04/26/21 1438     Clinical Impression Statement Patient reports that the left hip felt really good after the last treatment, I think it could have been the stretches, he is hesitating trying any gym on his own due to the recent increase of pain, we also noted difficulty with balance on dynamic surfaces, but other wise has anjle, hip and step strategies    PT Next Visit Plan see how the hip is doing. work on balance and him going to gym  on his  own    Consulted and Agree with Plan of Care Patient             Patient will benefit from skilled therapeutic intervention in order to improve the following deficits and impairments:  Decreased coordination, Decreased range of motion, Difficulty walking, Decreased endurance, Cardiopulmonary status limiting activity, Decreased activity tolerance, Decreased strength, Decreased mobility, Decreased balance  Visit Diagnosis: Muscle weakness (generalized)  Difficulty in walking, not elsewhere classified  Unsteadiness on feet     Problem List Patient Active Problem List   Diagnosis Date Noted   Secondary hypercoagulable state ([place]) 03/28/2020   Malignant neoplasm of prostate ([place]) 09/11/2018   Enlarged prostate with urinary obstruction 07/06/2018   Gastrocnemius strain, right, initial encounter 12/10/2017   Baker's cyst of knee, right 12/10/2017   Obesity 03/24/2017   Paroxysmal atrial fibrillation ([place]) 12/05/2016   Near syncope 04/20/2016   Leg hematoma, left, initial encounter 04/20/2016   Bleeding    Ecchymosis    Left hamstring muscle strain    Muscle tear    RBBB 09/64/3838   Chronic systolic dysfunction of left ventricle 07/11/2014   OSA (obstructive sleep apnea) 04/07/2013   Multinodular goiter 01/20/2013   Cervical spondylosis without myelopathy 01/18/2013   Atrial fibrillation with RVR ([place]) 10/27/2012   Left ventricular dysfunction 12/17/2011   Low back pain 09/11/2011   Fatigue 05/22/2011   PVC (premature ventricular contraction) 01/18/2011   Persistent atrial fibrillation ([place]) 01/12/2011   Hyperlipidemia 08/02/2010   Hypertension 08/02/2010   Osteoarthritis 08/02/2010   BPH (benign prostatic hyperplasia) 08/02/2010   GERD (gastroesophageal reflux disease) 08/02/2010   h/o Atrial flutter [facility]) s/p ablation 2012     Sumner Boast, PT 04/26/2021, 2:40 PM  [place]. [place], [place], [address] Phone: [phone]   Fax:  [phone]  Name: KAGE WILLMANN MRN: [id number] Date of Birth: 12/26/1933

## 2021-04-27 DIAGNOSIS — N401 Enlarged prostate with lower urinary tract symptoms: Secondary | ICD-10-CM | POA: Diagnosis not present

## 2021-04-27 DIAGNOSIS — R3912 Poor urinary stream: Secondary | ICD-10-CM | POA: Diagnosis not present

## 2021-04-27 DIAGNOSIS — H353132 Nonexudative age-related macular degeneration, bilateral, intermediate dry stage: Secondary | ICD-10-CM | POA: Diagnosis not present

## 2021-04-27 DIAGNOSIS — R3916 Straining to void: Secondary | ICD-10-CM | POA: Diagnosis not present

## 2021-05-01 ENCOUNTER — Ambulatory Visit: Payer: Medicare PPO | Admitting: Physical Therapy

## 2021-05-03 ENCOUNTER — Ambulatory Visit: Payer: Medicare PPO | Admitting: Physical Therapy

## 2021-05-03 ENCOUNTER — Encounter: Payer: Self-pay | Admitting: Physical Therapy

## 2021-05-03 ENCOUNTER — Other Ambulatory Visit: Payer: Self-pay

## 2021-05-03 DIAGNOSIS — M6281 Muscle weakness (generalized): Secondary | ICD-10-CM | POA: Diagnosis not present

## 2021-05-03 DIAGNOSIS — M542 Cervicalgia: Secondary | ICD-10-CM | POA: Diagnosis not present

## 2021-05-03 DIAGNOSIS — R2681 Unsteadiness on feet: Secondary | ICD-10-CM | POA: Diagnosis not present

## 2021-05-03 DIAGNOSIS — R262 Difficulty in walking, not elsewhere classified: Secondary | ICD-10-CM

## 2021-05-03 DIAGNOSIS — R252 Cramp and spasm: Secondary | ICD-10-CM | POA: Diagnosis not present

## 2021-05-03 NOTE — Therapy (Signed)
[place]. [place], [place], [address] Phone: [phone]   Fax:  [phone]  Physical Therapy Treatment  Patient Details  Name: Connor [name] MRN: [id number] Date of Birth: 1933/11/22 Referring Provider (PT): Copland   Encounter Date: 05/03/2021   PT End of Session - 05/03/21 1357     Visit Number 8    Number of Visits 12    Date for PT Re-Evaluation 05/18/21    Authorization Type [employer]    PT Start Time 1312    PT Stop Time 3785    PT Time Calculation (min) 46 min    Activity Tolerance Patient tolerated treatment well    Behavior During Therapy [facility] for tasks assessed/performed             Past Medical History:  Diagnosis Date   Benign localized prostatic hyperplasia with lower urinary tract symptoms (LUTS)    urologist-- dr Diona Fanti   Chronic anticoagulation    Xarelto for afib   Chronic constipation    Chronic cystitis    Gross hematuria    History of DVT of lower extremity 06/08/2010   right femoral dvt dx post op total hip 05-08-2010   History of gastric ulcer    remote   History of kidney stones    one stone. remote hx   Hyperlipidemia    Hypertension    Dx by Dr. Lance Muss around age  83    NICM (nonischemic cardiomyopathy) ([place])    per echo 06-11-2017,  ef 45-50% with diffuse hypokinesis,  G1DD   OSA on CPAP 2017   compliant with CPAP.  managed by Dr Elsworth Soho.   Osteoarthritis    Other urethral stricture, male, meatal    chronic;  pt self dilates   Paroxysmal atrial fibrillation [facility]) cardiologist-- dr Rayann Heman   first dx 2009/   a. documented by Dr Barrett Shell on office EKG 9/12. b. maintained on Tikosyn and Xarelto. c. s/p DCCV 's.  d.  x3  EP w/ ablation atrial fib last one 12-05-2016   Prediabetes    Premature ventricular contraction    PVC's (premature ventricular contractions)    RA (rheumatoid arthritis) ([place])    rheumatologist--- Dr Page Spiro,  treated with Humira   RBBB  (right bundle branch block)    Hx of a fib, ablation Aug 2018   Rheumatoid arthritis ([place])    Vitamin D deficiency    Wears hearing aid in both ears     Past Surgical History:  Procedure Laterality Date   ABLATION OF DYSRHYTHMIC FOCUS  08/29/2015   ATRIAL FIBRILLATION ABLATION  12/05/2016   ATRIAL FIBRILLATION ABLATION N/A 12/05/2016   Procedure: Atrial Fibrillation Ablation;  Surgeon: [name], MD;  Location: [place] CV LAB;  Service: Cardiovascular;  Laterality: N/A;   ATRIAL FIBRILLATION ABLATION N/A 02/29/2020   Procedure: ATRIAL FIBRILLATION ABLATION;  Surgeon: [name], MD;  Location: [place] CV LAB;  Service: Cardiovascular;  Laterality: N/A;   ATRIAL FLUTTER ABLATION  09/2010   by Glenford Bayley Right 1990s   CARDIOVERSION N/A 10/28/2012   Procedure: CARDIOVERSION;  Surgeon: Burnell Blanks, MD;  Location: [place];  Service: Cardiovascular;  Laterality: N/A;   CATARACT EXTRACTION W/ INTRAOCULAR LENS  IMPLANT, BILATERAL  2019   CYSTOSCOPY WITH BIOPSY N/A 09/03/2018   Procedure: CYSTOSCOPY WITH FULGURATION Fran Lowes;  Surgeon: Franchot Gallo, MD;  [facility];  Service: Urology;  Laterality: N/A;  30 MINS  ELECTROPHYSIOLOGIC STUDY N/A 08/29/2015   Procedure: Atrial Fibrillation Ablation;  Surgeon: [name], MD;  Location: [place] CV LAB;  Service: Cardiovascular;  Laterality: N/A;   KNEE SURGERY Bilateral x3   1960s & 1970s   open   LEFT HEART CATHETERIZATION WITH CORONARY ANGIOGRAM N/A 06/07/2011   Procedure: LEFT HEART CATHETERIZATION WITH CORONARY ANGIOGRAM;  Surgeon: Peter M Martinique, MD;  Location: [facility] CATH LAB;  Service: Cardiovascular;  Laterality: N/A;    Normal coronary arteries,  low normal LVF   TONSILLECTOMY AND ADENOIDECTOMY  age 18   TOTAL HIP ARTHROPLASTY Right 06/04/10    dr Noemi Chapel  _0    TOTAL KNEE ARTHROPLASTY Bilateral right 1995;  left [place]  06-29-2002   dr humphries  _1    TRANSURETHRAL  RESECTION OF PROSTATE N/A 07/06/2018   Procedure: TRANSURETHRAL RESECTION OF THE PROSTATE (TURP);  Surgeon: Franchot Gallo, MD;  Location: [facility];  Service: Urology;  Laterality: N/A;  45 MINS    There were no vitals filed for this visit.   Subjective Assessment - 05/03/21 1312     Subjective Doing better, still concerned about my hip and my stamina    Currently in Pain? No/denies                               [facility] Adult PT Treatment/Exercise - 05/03/21 0001       High Level Balance   High Level Balance Comments cone toe touches, then on airex      Knee/Hip Exercises: Aerobic   Recumbent Bike level\3 x 5 minutes    Nustep Level 5 x 7 minutes      Knee/Hip Exercises: Machines for Strengthening   Other Machine seated row, lats 20# 2x10, chest press 10# 2x10      Knee/Hip Exercises: Standing   Hip Flexion Both;2 sets;10 reps    Hip Flexion Limitations 3#    Hip Abduction Both;2 sets;10 reps    Abduction Limitations 3#    Hip Extension Both;2 sets;10 reps    Extension Limitations 3#    Walking with Sports Cord 30# all directions    Other Standing Knee Exercises 20# single arm farmer carry                       PT Short Term Goals - 04/17/21 1143       PT SHORT TERM GOAL #1   Title Pt will be I and compliant with initial HEP.    Status Partially Met               PT Long Term Goals - 05/03/21 1359       PT LONG TERM GOAL #1   Title Pt will be independent with long term HEP for improved posture.    Status On-going      PT LONG TERM GOAL #2   Title Pt will walk for 30 minutes with no report of lightheadedness, as needed for daily exercise and errands.    Status Partially Met      PT LONG TERM GOAL #3   Title decrease TUG to 12 seconds    Status On-going                   Plan - 05/03/21 1358     Clinical Impression Statement Patient struggled and got very tired with the resisted gait and  also the farmers carry, this caused him to have more difficulty getting up from the chair, with the resisted gait had two times he needed CGA    PT Next Visit Plan see if weather is good and go outside    [place] with Plan of Care Patient             Patient will benefit from skilled therapeutic intervention in order to improve the following deficits and impairments:  Decreased coordination, Decreased range of motion, Difficulty walking, Decreased endurance, Cardiopulmonary status limiting activity, Decreased activity tolerance, Decreased strength, Decreased mobility, Decreased balance  Visit Diagnosis: Muscle weakness (generalized)  Difficulty in walking, not elsewhere classified  Unsteadiness on feet     Problem List Patient Active Problem List   Diagnosis Date Noted   Secondary hypercoagulable state ([place]) 03/28/2020   Malignant neoplasm of prostate ([place]) 09/11/2018   Enlarged prostate with urinary obstruction 07/06/2018   Gastrocnemius strain, right, initial encounter 12/10/2017   Baker's cyst of knee, right 12/10/2017   Obesity 03/24/2017   Paroxysmal atrial fibrillation ([place]) 12/05/2016   Near syncope 04/20/2016   Leg hematoma, left, initial encounter 04/20/2016   Bleeding    Ecchymosis    Left hamstring muscle strain    Muscle tear    RBBB 76/72/0947   Chronic systolic dysfunction of left ventricle 07/11/2014   OSA (obstructive sleep apnea) 04/07/2013   Multinodular goiter 01/20/2013   Cervical spondylosis without myelopathy 01/18/2013   Atrial fibrillation with RVR ([place]) 10/27/2012   Left ventricular dysfunction 12/17/2011   Low back pain 09/11/2011   Fatigue 05/22/2011   PVC (premature ventricular contraction) 01/18/2011   Persistent atrial fibrillation ([place]) 01/12/2011   Hyperlipidemia 08/02/2010   Hypertension 08/02/2010   Osteoarthritis 08/02/2010   BPH (benign prostatic hyperplasia) 08/02/2010   GERD (gastroesophageal reflux disease)  08/02/2010   h/o Atrial flutter [facility]) s/p ablation 2012     Sumner Boast, PT 05/03/2021, 1:59 PM  [place]. [place], [place], [address] Phone: [phone]   Fax:  [phone]  Name: Connor [name] MRN: [id number] Date of Birth: 03-05-1934

## 2021-05-10 ENCOUNTER — Ambulatory Visit: Payer: Medicare PPO | Attending: Family Medicine | Admitting: Physical Therapy

## 2021-05-10 ENCOUNTER — Other Ambulatory Visit: Payer: Self-pay

## 2021-05-10 ENCOUNTER — Encounter: Payer: Self-pay | Admitting: Physical Therapy

## 2021-05-10 DIAGNOSIS — R2681 Unsteadiness on feet: Secondary | ICD-10-CM

## 2021-05-10 DIAGNOSIS — M542 Cervicalgia: Secondary | ICD-10-CM | POA: Diagnosis not present

## 2021-05-10 DIAGNOSIS — R262 Difficulty in walking, not elsewhere classified: Secondary | ICD-10-CM | POA: Diagnosis not present

## 2021-05-10 DIAGNOSIS — M6281 Muscle weakness (generalized): Secondary | ICD-10-CM | POA: Diagnosis not present

## 2021-05-10 DIAGNOSIS — R252 Cramp and spasm: Secondary | ICD-10-CM | POA: Diagnosis not present

## 2021-05-10 NOTE — Therapy (Signed)
[place] [place], [place], [address] Phone: [phone]   Fax:  [phone]  Physical Therapy Treatment  Patient Details  Name: Connor [name] MRN: [id number] Date of Birth: September 02, 1933 Referring Provider (PT): Copland   Encounter Date: 05/10/2021   PT End of Session - 05/10/21 1343     Visit Number 9    Number of Visits 12    Date for PT Re-Evaluation 05/18/21    Authorization Type [employer]    PT Start Time 1308    PT Stop Time 1350    PT Time Calculation (min) 42 min    Activity Tolerance Patient tolerated treatment well    Behavior During Therapy [facility] for tasks assessed/performed             Past Medical History:  Diagnosis Date   Benign localized prostatic hyperplasia with lower urinary tract symptoms (LUTS)    urologist-- dr Diona Fanti   Chronic anticoagulation    Xarelto for afib   Chronic constipation    Chronic cystitis    Gross hematuria    History of DVT of lower extremity 06/08/2010   right femoral dvt dx post op total hip 05-08-2010   History of gastric ulcer    remote   History of kidney stones    one stone. remote hx   Hyperlipidemia    Hypertension    Dx by Dr. Lance Muss around age  67    NICM (nonischemic cardiomyopathy) ([place])    per echo 06-11-2017,  ef 45-50% with diffuse hypokinesis,  G1DD   OSA on CPAP 2017   compliant with CPAP.  managed by Dr Elsworth Soho.   Osteoarthritis    Other urethral stricture, male, meatal    chronic;  pt self dilates   Paroxysmal atrial fibrillation [facility]) cardiologist-- dr Rayann Heman   first dx 2009/   a. documented by Dr Barrett Shell on office EKG 9/12. b. maintained on Tikosyn and Xarelto. c. s/p DCCV 's.  d.  x3  EP w/ ablation atrial fib last one 12-05-2016   Prediabetes    Premature ventricular contraction    PVC's (premature ventricular contractions)    RA (rheumatoid arthritis) ([place])    rheumatologist--- Dr Page Spiro,  treated with Humira   RBBB  (right bundle branch block)    Hx of a fib, ablation Aug 2018   Rheumatoid arthritis ([place])    Vitamin D deficiency    Wears hearing aid in both ears     Past Surgical History:  Procedure Laterality Date   ABLATION OF DYSRHYTHMIC FOCUS  08/29/2015   ATRIAL FIBRILLATION ABLATION  12/05/2016   ATRIAL FIBRILLATION ABLATION N/A 12/05/2016   Procedure: Atrial Fibrillation Ablation;  Surgeon: [name], MD;  Location: [place] CV LAB;  Service: Cardiovascular;  Laterality: N/A;   ATRIAL FIBRILLATION ABLATION N/A 02/29/2020   Procedure: ATRIAL FIBRILLATION ABLATION;  Surgeon: [name], MD;  Location: [place] CV LAB;  Service: Cardiovascular;  Laterality: N/A;   ATRIAL FLUTTER ABLATION  09/2010   by Glenford Bayley Right 1990s   CARDIOVERSION N/A 10/28/2012   Procedure: CARDIOVERSION;  Surgeon: Burnell Blanks, MD;  Location: [place];  Service: Cardiovascular;  Laterality: N/A;   CATARACT EXTRACTION W/ INTRAOCULAR LENS  IMPLANT, BILATERAL  2019   CYSTOSCOPY WITH BIOPSY N/A 09/03/2018   Procedure: CYSTOSCOPY WITH FULGURATION [place];  Surgeon: Franchot Gallo, MD;  Location: [facility];  Service: Urology;  Laterality: N/A;  30 MINS  ELECTROPHYSIOLOGIC STUDY N/A 08/29/2015   Procedure: Atrial Fibrillation Ablation;  Surgeon: [name], MD;  Location: [place] CV LAB;  Service: Cardiovascular;  Laterality: N/A;   KNEE SURGERY Bilateral x3   1960s & 1970s   open   LEFT HEART CATHETERIZATION WITH CORONARY ANGIOGRAM N/A 06/07/2011   Procedure: LEFT HEART CATHETERIZATION WITH CORONARY ANGIOGRAM;  Surgeon: Peter M Martinique, MD;  Location: [facility] CATH LAB;  Service: Cardiovascular;  Laterality: N/A;    Normal coronary arteries,  low normal LVF   TONSILLECTOMY AND ADENOIDECTOMY  age 47   TOTAL HIP ARTHROPLASTY Right 06/04/10    dr Noemi Chapel  '@[facility]'    TOTAL KNEE ARTHROPLASTY Bilateral right 1995;  left 1997   TRANSURETHRAL RESECTION OF PROSTATE  06-29-2002   dr humphries  '@[facility]'    TRANSURETHRAL  RESECTION OF PROSTATE N/A 07/06/2018   Procedure: TRANSURETHRAL RESECTION OF THE PROSTATE (TURP);  Surgeon: Franchot Gallo, MD;  Location: [facility];  Service: Urology;  Laterality: N/A;  45 MINS    There were no vitals filed for this visit.   Subjective Assessment - 05/10/21 1311     Subjective Reports that his wife fell on Friday and he has been busy and stressed, reports he is tired and dragging., reports that hip is doing better    Currently in Pain? No/denies                               [facility] Adult PT Treatment/Exercise - 05/10/21 0001       Ambulation/Gait   Gait Comments gait around the back building with 4 rest breaks, c/o fatigue lungs and legs      High Level Balance   High Level Balance Comments on airex marching, hip abduction, extension 2x10 in the pbars, on airex cone toe touches, ball toss      Knee/Hip Exercises: Aerobic   Recumbent Bike level\3 x 5 minutes    Nustep Level 5 x 7 minutes                       PT Short Term Goals - 04/17/21 1143       PT SHORT TERM GOAL #1   Title Pt will be I and compliant with initial HEP.    Status Partially Met               PT Long Term Goals - 05/10/21 1344       PT LONG TERM GOAL #1   Title Pt will be independent with long term HEP for improved posture.    Status On-going      PT LONG TERM GOAL #2   Title Pt will walk for 30 minutes with no report of lightheadedness, as needed for daily exercise and errands.    Status Partially Met                   Plan - 05/10/21 1343     Clinical Impression Statement Patient really struggled with stamina today, had to rest 4x on the walk around the back building, then once he sat had increased left hip pain, we finished with the bike to loosen this up as in the past stretching of the hip seemed to make him worse    PT Next Visit Plan will ask for more visits next week             Patient will  benefit from  skilled therapeutic intervention in order to improve the following deficits and impairments:  Decreased coordination, Decreased range of motion, Difficulty walking, Decreased endurance, Cardiopulmonary status limiting activity, Decreased activity tolerance, Decreased strength, Decreased mobility, Decreased balance  Visit Diagnosis: Muscle weakness (generalized)  Difficulty in walking, not elsewhere classified  Unsteadiness on feet     Problem List Patient Active Problem List   Diagnosis Date Noted   Secondary hypercoagulable state ([place]) 03/28/2020   Malignant neoplasm of prostate ([place]) 09/11/2018   Enlarged prostate with urinary obstruction 07/06/2018   Gastrocnemius strain, right, initial encounter 12/10/2017   Baker's cyst of knee, right 12/10/2017   Obesity 03/24/2017   Paroxysmal atrial fibrillation ([place]) 12/05/2016   Near syncope 04/20/2016   Leg hematoma, left, initial encounter 04/20/2016   Bleeding    Ecchymosis    Left hamstring muscle strain    Muscle tear    RBBB 90/30/0923   Chronic systolic dysfunction of left ventricle 07/11/2014   OSA (obstructive sleep apnea) 04/07/2013   Multinodular goiter 01/20/2013   Cervical spondylosis without myelopathy 01/18/2013   Atrial fibrillation with RVR ([place]) 10/27/2012   Left ventricular dysfunction 12/17/2011   Low back pain 09/11/2011   Fatigue 05/22/2011   PVC (premature ventricular contraction) 01/18/2011   Persistent atrial fibrillation ([place]) 01/12/2011   Hyperlipidemia 08/02/2010   Hypertension 08/02/2010   Osteoarthritis 08/02/2010   BPH (benign prostatic hyperplasia) 08/02/2010   GERD (gastroesophageal reflux disease) 08/02/2010   h/o Atrial flutter [facility]) s/p ablation 2012     Sumner Boast, PT 05/10/2021, 1:45 PM  [address] [place], [place], [address] Phone: [phone]   Fax:  [phone]  Name: Connor [name] MRN:  [id number] Date of Birth: Jul 22, 1933

## 2021-05-15 ENCOUNTER — Other Ambulatory Visit: Payer: Self-pay | Admitting: Internal Medicine

## 2021-05-15 ENCOUNTER — Ambulatory Visit: Payer: Medicare PPO | Admitting: Physical Therapy

## 2021-05-15 ENCOUNTER — Encounter: Payer: Self-pay | Admitting: Physical Therapy

## 2021-05-15 ENCOUNTER — Other Ambulatory Visit: Payer: Self-pay

## 2021-05-15 DIAGNOSIS — R2681 Unsteadiness on feet: Secondary | ICD-10-CM

## 2021-05-15 DIAGNOSIS — M6281 Muscle weakness (generalized): Secondary | ICD-10-CM

## 2021-05-15 DIAGNOSIS — R262 Difficulty in walking, not elsewhere classified: Secondary | ICD-10-CM | POA: Diagnosis not present

## 2021-05-15 DIAGNOSIS — R252 Cramp and spasm: Secondary | ICD-10-CM | POA: Diagnosis not present

## 2021-05-15 DIAGNOSIS — M542 Cervicalgia: Secondary | ICD-10-CM | POA: Diagnosis not present

## 2021-05-15 NOTE — Therapy (Signed)
[address]. [place], [place], [address] Phone: [phone]   Fax:  [phone] Progress Note Reporting Period 04/03/21 to 05/15/21 for visits 1-10  See note below for Objective Data and Assessment of Progress/Goals.     Physical Therapy Treatment  Patient Details  Name: Connor [name] MRN: [id number] Date of Birth: 07-27-1933 Referring Provider (PT): Copland   Encounter Date: 05/15/2021   PT End of Session - 05/15/21 1011     Visit Number 10    Date for PT Re-Evaluation 05/18/21    Authorization Type [employer]    PT Start Time 0928    PT Stop Time 1010    PT Time Calculation (min) 42 min    Activity Tolerance Patient tolerated treatment well    Behavior During Therapy [facility] for tasks assessed/performed             Past Medical History:  Diagnosis Date   Benign localized prostatic hyperplasia with lower urinary tract symptoms (LUTS)    urologist-- dr Diona Fanti   Chronic anticoagulation    Xarelto for afib   Chronic constipation    Chronic cystitis    Gross hematuria    History of DVT of lower extremity 06/08/2010   right femoral dvt dx post op total hip 05-08-2010   History of gastric ulcer    remote   History of kidney stones    one stone. remote hx   Hyperlipidemia    Hypertension    Dx by Dr. Lance Muss around age  23    NICM (nonischemic cardiomyopathy) ([place])    per echo 06-11-2017,  ef 45-50% with diffuse hypokinesis,  G1DD   OSA on CPAP 2017   compliant with CPAP.  managed by Dr Elsworth Soho.   Osteoarthritis    Other urethral stricture, male, meatal    chronic;  pt self dilates   Paroxysmal atrial fibrillation [facility]) cardiologist-- dr Rayann Heman   first dx 2009/   a. documented by Dr Barrett Shell on office EKG 9/12. b. maintained on Tikosyn and Xarelto. c. s/p DCCV 's.  d.  x3  EP w/ ablation atrial fib last one 12-05-2016   Prediabetes    Premature ventricular contraction    PVC's (premature  ventricular contractions)    RA (rheumatoid arthritis) ([place])    rheumatologist--- Dr Page Spiro,  treated with Humira   RBBB (right bundle branch block)    Hx of a fib, ablation Aug 2018   Rheumatoid arthritis ([place])    Vitamin D deficiency    Wears hearing aid in both ears     Past Surgical History:  Procedure Laterality Date   ABLATION OF DYSRHYTHMIC FOCUS  08/29/2015   ATRIAL FIBRILLATION ABLATION  12/05/2016   ATRIAL FIBRILLATION ABLATION N/A 12/05/2016   Procedure: Atrial Fibrillation Ablation;  Surgeon: [name], MD;  Location: [place] CV LAB;  Service: Cardiovascular;  Laterality: N/A;   ATRIAL FIBRILLATION ABLATION N/A 02/29/2020   Procedure: ATRIAL FIBRILLATION ABLATION;  Surgeon: [name], MD;  Location: [place] CV LAB;  Service: Cardiovascular;  Laterality: N/A;   ATRIAL FLUTTER ABLATION  09/2010   by Glenford Bayley Right 1990s   CARDIOVERSION N/A 10/28/2012   Procedure: CARDIOVERSION;  Surgeon: Burnell Blanks, MD;  Location: [place];  Service: Cardiovascular;  Laterality: N/A;   CATARACT EXTRACTION W/ INTRAOCULAR LENS  IMPLANT, BILATERAL  2019   CYSTOSCOPY WITH BIOPSY N/A 09/03/2018   Procedure: CYSTOSCOPY WITH FULGURATION [place];  Surgeon:  Franchot Gallo, MD;  [facility];  Service: Urology;  Laterality: N/A;  30 MINS   ELECTROPHYSIOLOGIC STUDY N/A 08/29/2015   Procedure: Atrial Fibrillation Ablation;  Surgeon: [name], MD;  Location: [place] CV LAB;  Service: Cardiovascular;  Laterality: N/A;   KNEE SURGERY Bilateral x3   1960s & 1970s   open   LEFT HEART CATHETERIZATION WITH CORONARY ANGIOGRAM N/A 06/07/2011   Procedure: LEFT HEART CATHETERIZATION WITH CORONARY ANGIOGRAM;  Surgeon: Peter M Martinique, MD;  Location: [facility] CATH LAB;  Service: Cardiovascular;  Laterality: N/A;    Normal coronary arteries,  low normal LVF   TONSILLECTOMY AND ADENOIDECTOMY  age 49   TOTAL HIP ARTHROPLASTY Right 06/04/10    dr Noemi Chapel  '@[facility]'    TOTAL KNEE  ARTHROPLASTY Bilateral right 1995;  left 1997   TRANSURETHRAL RESECTION OF PROSTATE  06-29-2002   dr humphries  '@[facility]'    TRANSURETHRAL RESECTION OF PROSTATE N/A 07/06/2018   Procedure: TRANSURETHRAL RESECTION OF THE PROSTATE (TURP);  Surgeon: Franchot Gallo, MD;  Location: [facility];  Service: Urology;  Laterality: N/A;  45 MINS    There were no vitals filed for this visit.   Subjective Assessment - 05/15/21 0928     Subjective Patient reports that he continued to have left hip pain after the last treatment, probably the walk that we did.  May call MD about this, he reports that he may need an injection    Currently in Pain? Yes    Pain Score 2     Pain Location Hip    Pain Orientation Left    Pain Descriptors / Indicators Sore    Aggravating Factors  walking and stretching                               [facility] Adult PT Treatment/Exercise - 05/15/21 0001       Knee/Hip Exercises: Aerobic   Recumbent Bike level\3 x 6 minutes    Nustep Level 5 x 7 minutes      Knee/Hip Exercises: Machines for Strengthening   Cybex Knee Extension 5# 3x10    Cybex Knee Flexion 25# 3x10    Other Machine seated row, lats 20# 2x10, chest press 10# 2x10, biceps, 25# triceps      Knee/Hip Exercises: Seated   Other Seated Knee/Hip Exercises hip adduction with ball, abduction with green tband, ball in lap isometrics                       PT Short Term Goals - 04/17/21 1143       PT SHORT TERM GOAL #1   Title Pt will be I and compliant with initial HEP.    Status Partially Met               PT Long Term Goals - 05/10/21 1344       PT LONG TERM GOAL #1   Title Pt will be independent with long term HEP for improved posture.    Status On-going      PT LONG TERM GOAL #2   Title Pt will walk for 30 minutes with no report of lightheadedness, as needed for daily exercise and errands.    Status Partially Met                   Plan -  05/15/21 1011     Clinical Impression Statement Patient with the  conitnued pain in the left hip, I tried to avoid weight bearing with motions today to see if we could help the pain, he reports that he may call the Md, he does not want a surgery, had no increase of hip pain with what we did today    PT Next Visit Plan assess hip and do [employer] renewal    Consulted and Agree with Plan of Care Patient             Patient will benefit from skilled therapeutic intervention in order to improve the following deficits and impairments:  Decreased coordination, Decreased range of motion, Difficulty walking, Decreased endurance, Cardiopulmonary status limiting activity, Decreased activity tolerance, Decreased strength, Decreased mobility, Decreased balance  Visit Diagnosis: Muscle weakness (generalized)  Difficulty in walking, not elsewhere classified  Unsteadiness on feet     Problem List Patient Active Problem List   Diagnosis Date Noted   Secondary hypercoagulable state ([place]) 03/28/2020   Malignant neoplasm of prostate ([place]) 09/11/2018   Enlarged prostate with urinary obstruction 07/06/2018   Gastrocnemius strain, right, initial encounter 12/10/2017   Baker's cyst of knee, right 12/10/2017   Obesity 03/24/2017   Paroxysmal atrial fibrillation ([place]) 12/05/2016   Near syncope 04/20/2016   Leg hematoma, left, initial encounter 04/20/2016   Bleeding    Ecchymosis    Left hamstring muscle strain    Muscle tear    RBBB 62/13/0865   Chronic systolic dysfunction of left ventricle 07/11/2014   OSA (obstructive sleep apnea) 04/07/2013   Multinodular goiter 01/20/2013   Cervical spondylosis without myelopathy 01/18/2013   Atrial fibrillation with RVR ([place]) 10/27/2012   Left ventricular dysfunction 12/17/2011   Low back pain 09/11/2011   Fatigue 05/22/2011   PVC (premature ventricular contraction) 01/18/2011   Persistent atrial fibrillation ([place]) 01/12/2011   Hyperlipidemia 08/02/2010    Hypertension 08/02/2010   Osteoarthritis 08/02/2010   BPH (benign prostatic hyperplasia) 08/02/2010   GERD (gastroesophageal reflux disease) 08/02/2010   h/o Atrial flutter [facility]) s/p ablation 2012     Sumner Boast, PT 05/15/2021, 11:38 AM  [address] [place], [place], [address] Phone: [phone]   Fax:  [phone]  Name: Connor [name] MRN: [id number] Date of Birth: March 26, 1934

## 2021-05-15 NOTE — Telephone Encounter (Signed)
Pt last saw Clint Fenton, PA on 03/14/21, last labs 03/19/21 Creat 0.84, age 88, weight 99.4kg, CrCl 90.39, based on CrCl pt is on appropriate dosage of Xarelto 20mg  QD for afib.  Will refill rx.

## 2021-05-17 ENCOUNTER — Encounter: Payer: Self-pay | Admitting: Physical Therapy

## 2021-05-17 ENCOUNTER — Ambulatory Visit: Payer: Medicare PPO | Admitting: Physical Therapy

## 2021-05-17 ENCOUNTER — Other Ambulatory Visit: Payer: Self-pay

## 2021-05-17 DIAGNOSIS — R262 Difficulty in walking, not elsewhere classified: Secondary | ICD-10-CM | POA: Diagnosis not present

## 2021-05-17 DIAGNOSIS — M542 Cervicalgia: Secondary | ICD-10-CM

## 2021-05-17 DIAGNOSIS — M6281 Muscle weakness (generalized): Secondary | ICD-10-CM | POA: Diagnosis not present

## 2021-05-17 DIAGNOSIS — R2681 Unsteadiness on feet: Secondary | ICD-10-CM | POA: Diagnosis not present

## 2021-05-17 DIAGNOSIS — R252 Cramp and spasm: Secondary | ICD-10-CM | POA: Diagnosis not present

## 2021-05-17 NOTE — Therapy (Signed)
[facility] [place], [place], [address] Phone: [phone]   Fax:  [phone]  Physical Therapy Treatment  Patient Details  Name: Connor [name] MRN: [id number] Date of Birth: 1934/03/30 Referring Provider (PT): Copland   Encounter Date: 05/17/2021   PT End of Session - 05/17/21 1004     Visit Number 11    Date for PT Re-Evaluation 05/18/21    Authorization Type [employer]    PT Start Time 0924    PT Stop Time 1006    PT Time Calculation (min) 42 min    Activity Tolerance Patient tolerated treatment well    Behavior During Therapy [facility] for tasks assessed/performed             Past Medical History:  Diagnosis Date   Benign localized prostatic hyperplasia with lower urinary tract symptoms (LUTS)    urologist-- dr Diona Fanti   Chronic anticoagulation    Xarelto for afib   Chronic constipation    Chronic cystitis    Gross hematuria    History of DVT of lower extremity 06/08/2010   right femoral dvt dx post op total hip 05-08-2010   History of gastric ulcer    remote   History of kidney stones    one stone. remote hx   Hyperlipidemia    Hypertension    Dx by Dr. Lance Muss around age  61    NICM (nonischemic cardiomyopathy) ([place])    per echo 06-11-2017,  ef 45-50% with diffuse hypokinesis,  G1DD   OSA on CPAP 2017   compliant with CPAP.  managed by Dr Elsworth Soho.   Osteoarthritis    Other urethral stricture, male, meatal    chronic;  pt self dilates   Paroxysmal atrial fibrillation [facility]) cardiologist-- dr Rayann Heman   first dx 2009/   a. documented by Dr Barrett Shell on office EKG 9/12. b. maintained on Tikosyn and Xarelto. c. s/p DCCV 's.  d.  x3  EP w/ ablation atrial fib last one 12-05-2016   Prediabetes    Premature ventricular contraction    PVC's (premature ventricular contractions)    RA (rheumatoid arthritis) ([place])    rheumatologist--- Dr Page Spiro,  treated with Humira   RBBB (right bundle branch  block)    Hx of a fib, ablation Aug 2018   Rheumatoid arthritis ([place])    Vitamin D deficiency    Wears hearing aid in both ears     Past Surgical History:  Procedure Laterality Date   ABLATION OF DYSRHYTHMIC FOCUS  08/29/2015   ATRIAL FIBRILLATION ABLATION  12/05/2016   ATRIAL FIBRILLATION ABLATION N/A 12/05/2016   Procedure: Atrial Fibrillation Ablation;  Surgeon: [name], MD;  Location: [place] [facility];  Service: Cardiovascular;  Laterality: N/A;   ATRIAL FIBRILLATION ABLATION N/A 02/29/2020   Procedure: ATRIAL FIBRILLATION ABLATION;  Surgeon: [name], MD;  Location: [place] [facility];  Service: Cardiovascular;  Laterality: N/A;   ATRIAL FLUTTER ABLATION  09/2010   by Glenford Bayley Right 1990s   CARDIOVERSION N/A 10/28/2012   Procedure: CARDIOVERSION;  Surgeon: Burnell Blanks, MD;  Location: [place];  Service: Cardiovascular;  Laterality: N/A;   CATARACT EXTRACTION W/ INTRAOCULAR LENS  IMPLANT, BILATERAL  2019   CYSTOSCOPY WITH BIOPSY N/A 09/03/2018   Procedure: CYSTOSCOPY WITH FULGURATION Fran Lowes;  Surgeon: Franchot Gallo, MD;  Location: [facility];  Service: Urology;  Laterality: N/A;  30 MINS   ELECTROPHYSIOLOGIC STUDY N/A 08/29/2015  Procedure: Atrial Fibrillation Ablation;  Surgeon: [name], MD;  Location: [place] [facility];  Service: Cardiovascular;  Laterality: N/A;   KNEE SURGERY Bilateral x3   1960s & 1970s   open   LEFT HEART CATHETERIZATION WITH CORONARY ANGIOGRAM N/A 06/07/2011   Procedure: LEFT HEART CATHETERIZATION WITH CORONARY ANGIOGRAM;  Surgeon: Peter M Martinique, MD;  Location: [facility] CATH LAB;  Service: Cardiovascular;  Laterality: N/A;    Normal coronary arteries,  low normal LVF   TONSILLECTOMY AND ADENOIDECTOMY  age 37   TOTAL HIP ARTHROPLASTY Right 06/04/10    dr Noemi Chapel  '@[facility]'    TOTAL KNEE ARTHROPLASTY Bilateral right 1995;  left 1997   TRANSURETHRAL RESECTION OF PROSTATE  06-29-2002   dr humphries  '@[facility]'    TRANSURETHRAL RESECTION OF  PROSTATE N/A 07/06/2018   Procedure: TRANSURETHRAL RESECTION OF THE PROSTATE (TURP);  Surgeon: Franchot Gallo, MD;  Location: [facility];  Service: Urology;  Laterality: N/A;  45 MINS    There were no vitals filed for this visit.   Subjective Assessment - 05/17/21 0923     Subjective My hip did okay after the last visit, it just gets stiff when I sit and then when I get up the hip hurts and takes me some time to get going    Currently in Pain? Yes    Pain Score 3     Pain Location Hip    Pain Orientation Left    Pain Descriptors / Indicators Sore;Tightness                [facility] PT Assessment - 05/17/21 0001       Timed Up and Go Test   Normal TUG (seconds) 12                           [facility] Adult PT Treatment/Exercise - 05/17/21 0001       High Level Balance   High Level Balance Comments on airex ball toss      Knee/Hip Exercises: Aerobic   Recumbent Bike level\3 x 6 minutes    Nustep Level 5 x 7 minutes      Knee/Hip Exercises: Machines for Strengthening   Cybex Knee Extension 5# 3x10    Cybex Knee Flexion 35# x10, 25# 2x10    Other Machine seated row, lats 25# 2x10, chest press 10# 2x10, biceps, 25# triceps      Knee/Hip Exercises: Seated   Other Seated Knee/Hip Exercises hip adduction with ball, abduction with green tband, ball in lap isometrics    Marching Both;2 sets;10 reps    Marching Weights 3 lbs.                       PT Short Term Goals - 04/17/21 1143       PT SHORT TERM GOAL #1   Title Pt will be I and compliant with initial HEP.    Status Partially Met               PT Long Term Goals - 05/17/21 1013       PT LONG TERM GOAL #1   Title Pt will be independent with long term HEP for improved posture.    Status Partially Met      PT LONG TERM GOAL #2   Title Pt will walk for 30 minutes with no report of lightheadedness, as needed for daily exercise and errands.    Status Partially  Met       PT LONG TERM GOAL #3   Title decrease TUG to 12 seconds    Status Partially Met      PT LONG TERM GOAL #4   Title walk 1000 feet without difficulty during 6 minute walk test    Status Partially Met      PT LONG TERM GOAL #5   Title increase berg balance score to 50/56 to decrease fall risk                   Plan - 05/17/21 1004     Clinical Impression Statement Patient overall is improving well, much faster with the TUG from the evaluation, he has had some left hip pain and it flares up at times with walking or certain activities, we are trying to work through this at this time, he reports that he is feeling stronger, He was doing well with the endurance, but this hip pain has caused [place] to stop some of the longer walks, we are now doing more motions and less weight bearing.  He has taken my advice and gone to a gym to look at gyms and has taken some pictures and we are working on him being safe with using them on his own    PT Next Visit Plan over this next period would want to assure that the left hip is okay and try to get him independent and safe with going to a gym on his own    Consulted and Agree with Plan of Care Patient             Patient will benefit from skilled therapeutic intervention in order to improve the following deficits and impairments:  Decreased coordination, Decreased range of motion, Difficulty walking, Decreased endurance, Cardiopulmonary status limiting activity, Decreased activity tolerance, Decreased strength, Decreased mobility, Decreased balance  Visit Diagnosis: Muscle weakness (generalized)  Difficulty in walking, not elsewhere classified  Unsteadiness on feet  Cervicalgia     Problem List Patient Active Problem List   Diagnosis Date Noted   Secondary hypercoagulable state ([place]) 03/28/2020   Malignant neoplasm of prostate ([place]) 09/11/2018   Enlarged prostate with urinary obstruction 07/06/2018   Gastrocnemius strain, right,  initial encounter 12/10/2017   Baker's cyst of knee, right 12/10/2017   Obesity 03/24/2017   Paroxysmal atrial fibrillation ([place]) 12/05/2016   Near syncope 04/20/2016   Leg hematoma, left, initial encounter 04/20/2016   Bleeding    Ecchymosis    Left hamstring muscle strain    Muscle tear    RBBB 98/26/4158   Chronic systolic dysfunction of left ventricle 07/11/2014   OSA (obstructive sleep apnea) 04/07/2013   Multinodular goiter 01/20/2013   Cervical spondylosis without myelopathy 01/18/2013   Atrial fibrillation with RVR ([place]) 10/27/2012   Left ventricular dysfunction 12/17/2011   Low back pain 09/11/2011   Fatigue 05/22/2011   PVC (premature ventricular contraction) 01/18/2011   Persistent atrial fibrillation ([place]) 01/12/2011   Hyperlipidemia 08/02/2010   Hypertension 08/02/2010   Osteoarthritis 08/02/2010   BPH (benign prostatic hyperplasia) 08/02/2010   GERD (gastroesophageal reflux disease) 08/02/2010   h/o Atrial flutter [facility]) s/p ablation 2012     Sumner Boast, PT 05/17/2021, 10:13 AM  [facility] [place], [place], [address] Phone: [phone]   Fax:  [phone]  Name: Connor [name] MRN: [id number] Date of Birth: May 09, 1933

## 2021-05-23 DIAGNOSIS — I739 Peripheral vascular disease, unspecified: Secondary | ICD-10-CM | POA: Diagnosis not present

## 2021-05-23 DIAGNOSIS — L603 Nail dystrophy: Secondary | ICD-10-CM | POA: Diagnosis not present

## 2021-05-23 DIAGNOSIS — B351 Tinea unguium: Secondary | ICD-10-CM | POA: Diagnosis not present

## 2021-05-23 DIAGNOSIS — L84 Corns and callosities: Secondary | ICD-10-CM | POA: Diagnosis not present

## 2021-05-27 DIAGNOSIS — H353132 Nonexudative age-related macular degeneration, bilateral, intermediate dry stage: Secondary | ICD-10-CM | POA: Diagnosis not present

## 2021-05-29 ENCOUNTER — Encounter: Payer: Self-pay | Admitting: Physical Therapy

## 2021-05-29 ENCOUNTER — Ambulatory Visit: Payer: Medicare PPO | Admitting: Physical Therapy

## 2021-05-29 ENCOUNTER — Other Ambulatory Visit: Payer: Self-pay

## 2021-05-29 DIAGNOSIS — R262 Difficulty in walking, not elsewhere classified: Secondary | ICD-10-CM

## 2021-05-29 DIAGNOSIS — M1612 Unilateral primary osteoarthritis, left hip: Secondary | ICD-10-CM | POA: Diagnosis not present

## 2021-05-29 DIAGNOSIS — M6281 Muscle weakness (generalized): Secondary | ICD-10-CM | POA: Diagnosis not present

## 2021-05-29 DIAGNOSIS — R252 Cramp and spasm: Secondary | ICD-10-CM | POA: Diagnosis not present

## 2021-05-29 DIAGNOSIS — M542 Cervicalgia: Secondary | ICD-10-CM

## 2021-05-29 DIAGNOSIS — R2681 Unsteadiness on feet: Secondary | ICD-10-CM | POA: Diagnosis not present

## 2021-05-29 DIAGNOSIS — M5416 Radiculopathy, lumbar region: Secondary | ICD-10-CM | POA: Diagnosis not present

## 2021-05-29 NOTE — Therapy (Signed)
[facility] [place], [place], [address] Phone: [phone]   Fax:  [phone]  Physical Therapy Treatment  Patient Details  Name: Connor [name] MRN: [id number] Date of Birth: 1933/09/17 Referring Provider (PT): Copland   Encounter Date: 05/29/2021   PT End of Session - 05/29/21 1612     Visit Number 12    Date for PT Re-Evaluation 07/17/21    Authorization Type [employer]    Authorization Time Period 1/12    PT Start Time 1525    PT Stop Time 1610    PT Time Calculation (min) 45 min    Activity Tolerance Patient tolerated treatment well    Behavior During Therapy [facility] for tasks assessed/performed             Past Medical History:  Diagnosis Date   Benign localized prostatic hyperplasia with lower urinary tract symptoms (LUTS)    urologist-- dr Diona Fanti   Chronic anticoagulation    Xarelto for afib   Chronic constipation    Chronic cystitis    Gross hematuria    History of DVT of lower extremity 06/08/2010   right femoral dvt dx post op total hip 05-08-2010   History of gastric ulcer    remote   History of kidney stones    one stone. remote hx   Hyperlipidemia    Hypertension    Dx by Dr. Lance Muss around age  88    NICM (nonischemic cardiomyopathy) ([place])    per echo 06-11-2017,  ef 45-50% with diffuse hypokinesis,  G1DD   OSA on CPAP 2017   compliant with CPAP.  managed by Dr Elsworth Soho.   Osteoarthritis    Other urethral stricture, male, meatal    chronic;  pt self dilates   Paroxysmal atrial fibrillation [facility]) cardiologist-- dr Rayann Heman   first dx 2009/   a. documented by Dr Barrett Shell on office EKG 9/12. b. maintained on Tikosyn and Xarelto. c. s/p DCCV 's.  d.  x3  EP w/ ablation atrial fib last one 12-05-2016   Prediabetes    Premature ventricular contraction    PVC's (premature ventricular contractions)    RA (rheumatoid arthritis) ([place])    rheumatologist--- Dr Page Spiro,  treated with Humira    RBBB (right bundle branch block)    Hx of a fib, ablation Aug 2018   Rheumatoid arthritis ([place])    Vitamin D deficiency    Wears hearing aid in both ears     Past Surgical History:  Procedure Laterality Date   ABLATION OF DYSRHYTHMIC FOCUS  08/29/2015   ATRIAL FIBRILLATION ABLATION  12/05/2016   ATRIAL FIBRILLATION ABLATION N/A 12/05/2016   Procedure: Atrial Fibrillation Ablation;  Surgeon: [name], MD;  Location: [place] [facility];  Service: Cardiovascular;  Laterality: N/A;   ATRIAL FIBRILLATION ABLATION N/A 02/29/2020   Procedure: ATRIAL FIBRILLATION ABLATION;  Surgeon: [name], MD;  Location: [place] [facility];  Service: Cardiovascular;  Laterality: N/A;   ATRIAL FLUTTER ABLATION  09/2010   by Glenford Bayley Right 1990s   CARDIOVERSION N/A 10/28/2012   Procedure: CARDIOVERSION;  Surgeon: Burnell Blanks, MD;  Location: [place];  Service: Cardiovascular;  Laterality: N/A;   CATARACT EXTRACTION W/ INTRAOCULAR LENS  IMPLANT, BILATERAL  2019   CYSTOSCOPY WITH BIOPSY N/A 09/03/2018   Procedure: CYSTOSCOPY WITH FULGURATION Fran [place]es;  Surgeon: Franchot Gallo, MD;  Location[facility];  Service: Urology;  Laterality: N/A;  30 MINS  ELECTROPHYSIOLOGIC STUDY N/A 08/29/2015   Procedure: Atrial Fibrillation Ablation;  Surgeon: [name], MD;  Location: [place] [facility];  Service: Cardiovascular;  Laterality: N/A;   KNEE SURGERY Bilateral x3   1960s & 1970s   open   LEFT HEART CATHETERIZATION WITH CORONARY ANGIOGRAM N/A 06/07/2011   Procedure: LEFT HEART CATHETERIZATION WITH CORONARY ANGIOGRAM;  Surgeon: Peter M Martinique, MD;  Location: [facility] CATH LAB;  Service: Cardiovascular;  Laterality: N/A;    Normal coronary arteries,  low normal LVF   TONSILLECTOMY AND ADENOIDECTOMY  age 88   TOTAL HIP ARTHROPLASTY Right 06/04/10    dr Noemi Chapel  '@[facility]'    TOTAL KNEE ARTHROPLASTY Bilateral right 1995;  left [place]  06-29-2002   dr humphries  '@[facility]'     TRANSURETHRAL RESECTION OF PROSTATE N/A 07/06/2018   Procedure: TRANSURETHRAL RESECTION OF THE PROSTATE (TURP);  Surgeon: Franchot Gallo, MD;  Location: [facility];  Service: Urology;  Laterality: N/A;  45 MINS    There were no vitals filed for this visit.   Subjective Assessment - 05/29/21 1533     Subjective I have been having left hip pain, He saw the orthopod today, gave prednisone and a new referral for PT to treat teh hip and back pain, has OA.    Currently in Pain? Yes    Pain Score 7     Pain Location Hip    Pain Orientation Left    Pain Descriptors / Indicators Aching    Aggravating Factors  reports that this morning had pain an 8/10 getting up from having coffee                               [facility] Adult PT Treatment/Exercise - 05/29/21 0001       Knee/Hip Exercises: Aerobic   Recumbent Bike level\3 x 6 minutes    Nustep Level 5 x 6 minutes      Knee/Hip Exercises: Machines for Strengthening   Cybex Knee Extension 5# 3x10    Cybex Knee Flexion 35# x10, 25# 2x10    Other Machine seated row, lats 25# 2x10, chest press 10# 2x10, biceps, 25# triceps      Knee/Hip Exercises: Standing   Hip Abduction Both;2 sets;10 reps    Abduction Limitations 3#    Hip Extension Both;2 sets;10 reps    Extension Limitations 3#      Knee/Hip Exercises: Supine   Bridges with [employer] 10 reps    Bridges with [employer] 10 reps    Other Supine Knee/Hip Exercises feet on ball K2C, trunk rotation, small bridges and isometric abs                       PT Short Term Goals - 04/17/21 1143       PT SHORT TERM GOAL #1   Title Pt will be I and compliant with initial HEP.    Status Partially Met               PT Long Term Goals - 05/17/21 1013       PT LONG TERM GOAL #1   Title Pt will be independent with long term HEP for improved posture.    Status Partially Met      PT LONG TERM GOAL #2   Title Pt will walk for 30 minutes  with no report of lightheadedness, as needed for daily  exercise and errands.    Status Partially Met      PT LONG TERM GOAL #3   Title decrease TUG to 12 seconds    Status Partially Met      PT LONG TERM GOAL #4   Title walk 1000 feet without difficulty during 6 minute walk test    Status Partially Met      PT LONG TERM GOAL #5   Title increase berg balance score to 50/56 to decrease fall risk                   Plan - 05/29/21 1612     Clinical Impression Statement Patient came in with a new order for the left hip and back, MD reports OA in the hip and some in the back and has some radiculopathy, has left buttock pain.  We will add some treatment with this if he tolerates    PT Next Visit Plan see if his hip does okay and work on it as needed per MD    Consulted and Agree with Plan of Care Patient             Patient will benefit from skilled therapeutic intervention in order to improve the following deficits and impairments:  Decreased coordination, Decreased range of motion, Difficulty walking, Decreased endurance, Cardiopulmonary status limiting activity, Decreased activity tolerance, Decreased strength, Decreased mobility, Decreased balance  Visit Diagnosis: Muscle weakness (generalized)  Difficulty in walking, not elsewhere classified  Unsteadiness on feet  Cervicalgia     Problem List Patient Active Problem List   Diagnosis Date Noted   Secondary hypercoagulable state ([place]) 03/28/2020   Malignant neoplasm of prostate ([place]) 09/11/2018   Enlarged prostate with urinary obstruction 07/06/2018   Gastrocnemius strain, right, initial encounter 12/10/2017   Baker's cyst of knee, right 12/10/2017   Obesity 03/24/2017   Paroxysmal atrial fibrillation ([place]) 12/05/2016   Near syncope 04/20/2016   Leg hematoma, left, initial encounter 04/20/2016   Bleeding    Ecchymosis    Left hamstring muscle strain    Muscle tear    RBBB 01/41/0301   Chronic systolic  dysfunction of left ventricle 07/11/2014   OSA (obstructive sleep apnea) 04/07/2013   Multinodular goiter 01/20/2013   Cervical spondylosis without myelopathy 01/18/2013   Atrial fibrillation with RVR ([place]) 10/27/2012   Left ventricular dysfunction 12/17/2011   Low back pain 09/11/2011   Fatigue 05/22/2011   PVC (premature ventricular contraction) 01/18/2011   Persistent atrial fibrillation ([place]) 01/12/2011   Hyperlipidemia 08/02/2010   Hypertension 08/02/2010   Osteoarthritis 08/02/2010   BPH (benign prostatic hyperplasia) 08/02/2010   GERD (gastroesophageal reflux disease) 08/02/2010   h/o Atrial flutter [facility]) s/p ablation 2012     Sumner Boast, PT 05/29/2021, 4:15 PM  [facility] [place], [place], [address] Phone: [phone]   Fax:  [phone]  Name: Connor [name] MRN: [id number] Date of Birth: [birth year]

## 2021-05-31 ENCOUNTER — Ambulatory Visit: Payer: Medicare PPO | Admitting: Physical Therapy

## 2021-05-31 ENCOUNTER — Other Ambulatory Visit: Payer: Self-pay

## 2021-05-31 ENCOUNTER — Encounter: Payer: Self-pay | Admitting: Physical Therapy

## 2021-05-31 DIAGNOSIS — R262 Difficulty in walking, not elsewhere classified: Secondary | ICD-10-CM

## 2021-05-31 DIAGNOSIS — M6281 Muscle weakness (generalized): Secondary | ICD-10-CM | POA: Diagnosis not present

## 2021-05-31 DIAGNOSIS — M542 Cervicalgia: Secondary | ICD-10-CM

## 2021-05-31 DIAGNOSIS — R252 Cramp and spasm: Secondary | ICD-10-CM | POA: Diagnosis not present

## 2021-05-31 DIAGNOSIS — R2681 Unsteadiness on feet: Secondary | ICD-10-CM

## 2021-05-31 NOTE — Therapy (Signed)
[place]. [place], [place], [address] Phone: [phone]   Fax:  [phone]  Physical Therapy Treatment  Patient Details  Name: Connor [name] MRN: [id number] Date of Birth: 19-Mar-1934 Referring Provider (PT): Copland   Encounter Date: 05/31/2021   PT End of Session - 05/31/21 1525     Visit Number 13    Date for PT Re-Evaluation 07/17/21    Authorization Type [employer]    Authorization Time Period 2/12    PT Start Time 1440    PT Stop Time 1527    PT Time Calculation (min) 47 min    Activity Tolerance Patient tolerated treatment well    Behavior During Therapy [facility] for tasks assessed/performed             Past Medical History:  Diagnosis Date   Benign localized prostatic hyperplasia with lower urinary tract symptoms (LUTS)    urologist-- dr Diona Fanti   Chronic anticoagulation    Xarelto for afib   Chronic constipation    Chronic cystitis    Gross hematuria    History of DVT of lower extremity 06/08/2010   right femoral dvt dx post op total hip 05-08-2010   History of gastric ulcer    remote   History of kidney stones    one stone. remote hx   Hyperlipidemia    Hypertension    Dx by Dr. Lance Muss around age  20    NICM (nonischemic cardiomyopathy) ([place])    per echo 06-11-2017,  ef 45-50% with diffuse hypokinesis,  G1DD   OSA on CPAP 2017   compliant with CPAP.  managed by Dr Elsworth Soho.   Osteoarthritis    Other urethral stricture, male, meatal    chronic;  pt self dilates   Paroxysmal atrial fibrillation [facility]) cardiologist-- dr Rayann Heman   first dx 2009/   a. documented by Dr Barrett Shell on office EKG 9/12. b. maintained on Tikosyn and Xarelto. c. s/p DCCV 's.  d.  x3  EP w/ ablation atrial fib last one 12-05-2016   Prediabetes    Premature ventricular contraction    PVC's (premature ventricular contractions)    RA (rheumatoid arthritis) ([place])    rheumatologist--- Dr Page Spiro,  treated with Humira    RBBB (right bundle branch block)    Hx of a fib, ablation Aug 2018   Rheumatoid arthritis ([place])    Vitamin D deficiency    Wears hearing aid in both ears     Past Surgical History:  Procedure Laterality Date   ABLATION OF DYSRHYTHMIC FOCUS  08/29/2015   ATRIAL FIBRILLATION ABLATION  12/05/2016   ATRIAL FIBRILLATION ABLATION N/A 12/05/2016   Procedure: Atrial Fibrillation Ablation;  Surgeon: [name], MD;  Location: [place] [facility];  Service: Cardiovascular;  Laterality: N/A;   ATRIAL FIBRILLATION ABLATION N/A 02/29/2020   Procedure: ATRIAL FIBRILLATION ABLATION;  Surgeon: [name], MD;  Location: [place] [facility];  Service: Cardiovascular;  Laterality: N/A;   ATRIAL FLUTTER ABLATION  09/2010   by Glenford Bayley Right 1990s   CARDIOVERSION N/A 10/28/2012   Procedure: CARDIOVERSION;  Surgeon: Burnell Blanks, MD;  Location: [place];  Service: Cardiovascular;  Laterality: N/A;   CATARACT EXTRACTION W/ INTRAOCULAR LENS  IMPLANT, BILATERAL  2019   CYSTOSCOPY WITH BIOPSY N/A 09/03/2018   Procedure: CYSTOSCOPY WITH FULGURATION Fran Lowes;  Surgeon: Franchot Gallo, MD;  Location: [facility];  Service: Urology;  Laterality: N/A;  30 MINS  ELECTROPHYSIOLOGIC STUDY N/A 08/29/2015   Procedure: Atrial Fibrillation Ablation;  Surgeon: [name], MD;  Location: [place] [facility];  Service: Cardiovascular;  Laterality: N/A;   KNEE SURGERY Bilateral x3   1960s & 1970s   open   LEFT HEART CATHETERIZATION WITH CORONARY ANGIOGRAM N/A 06/07/2011   Procedure: LEFT HEART CATHETERIZATION WITH CORONARY ANGIOGRAM;  Surgeon: Peter M Martinique, MD;  Location: [place] [facility];  Service: Cardiovascular;  Laterality: N/A;    Normal coronary arteries,  low normal LVF   TONSILLECTOMY AND ADENOIDECTOMY  age 76   TOTAL HIP ARTHROPLASTY Right 06/04/10    dr Noemi Chapel  '@[facility]'    TOTAL KNEE ARTHROPLASTY Bilateral right 1995;  left [place]  06-29-2002   dr humphries  '@[facility]'     TRANSURETHRAL RESECTION OF PROSTATE N/A 07/06/2018   Procedure: TRANSURETHRAL RESECTION OF THE PROSTATE (TURP);  Surgeon: Franchot Gallo, MD;  Location: [facility];  Service: Urology;  Laterality: N/A;  45 MINS    There were no vitals filed for this visit.   Subjective Assessment - 05/31/21 1446     Subjective I still have times that the hip really locks up, reports that he is now on prednisone.  Reports no pain right now    Currently in Pain? Yes    Pain Score 3     Pain Location Hip    Pain Orientation Left    Pain Descriptors / Indicators Aching;Sore    Aggravating Factors  just catches after sitting                               [facility] Adult PT Treatment/Exercise - 05/31/21 0001       Knee/Hip Exercises: Stretches   Other Knee/Hip Stretches passive left hip distraction, and knee to chest      Knee/Hip Exercises: Aerobic   Recumbent Bike level\4 x 6 minutes    Nustep Level 5 x 6 minutes      Knee/Hip Exercises: Machines for Strengthening   Cybex Knee Extension 5# 3x10    Cybex Knee Flexion 35# 2x10    Other Machine seated row, lats 25# 2x10, chest press 10# 2x10, biceps, 25# triceps      Knee/Hip Exercises: Standing   Hip Flexion Both;2 sets;10 reps    Hip Flexion Limitations 3#    Hip Abduction Both;2 sets;10 reps    Abduction Limitations 3#    Hip Extension Both;2 sets;10 reps    Extension Limitations 3#      Knee/Hip Exercises: Supine   Bridges with [employer] 15 reps    Bridges with Clamshell 15 reps    Other Supine Knee/Hip Exercises feet on ball K2C, trunk rotation, small bridges and isometric abs                       PT Short Term Goals - 04/17/21 1143       PT SHORT TERM GOAL #1   Title Pt will be I and compliant with initial HEP.    Status Partially Met               PT Long Term Goals - 05/31/21 1528       PT LONG TERM GOAL #1   Title Pt will be independent with long term HEP for  improved posture.    Status Partially Met      PT LONG TERM GOAL #  2   Title Pt will walk for 30 minutes with no report of lightheadedness, as needed for daily exercise and errands.    Status On-going                   Plan - 05/31/21 1526     Clinical Impression Statement Patient still with some left hip issues, he started prednisone, still diffiuclty getting up and walking after sitting.  He has had pain flare up in the past with walking and with stretching, so I am trying to go slow with this    PT Next Visit Plan see if his hip does okay and work on it as needed per MD    Consulted and Agree with Plan of Care Patient             Patient will benefit from skilled therapeutic intervention in order to improve the following deficits and impairments:  Decreased coordination, Decreased range of motion, Difficulty walking, Decreased endurance, Cardiopulmonary status limiting activity, Decreased activity tolerance, Decreased strength, Decreased mobility, Decreased balance  Visit Diagnosis: Muscle weakness (generalized)  Difficulty in walking, not elsewhere classified  Unsteadiness on feet  Cervicalgia     Problem List Patient Active Problem List   Diagnosis Date Noted   Secondary hypercoagulable state ([place]) 03/28/2020   Malignant neoplasm of prostate ([place]) 09/11/2018   Enlarged prostate with urinary obstruction 07/06/2018   Gastrocnemius strain, right, initial encounter 12/10/2017   Baker's cyst of knee, right 12/10/2017   Obesity 03/24/2017   Paroxysmal atrial fibrillation ([place]) 12/05/2016   Near syncope 04/20/2016   Leg hematoma, left, initial encounter 04/20/2016   Bleeding    Ecchymosis    Left hamstring muscle strain    Muscle tear    RBBB 79/98/7215   Chronic systolic dysfunction of left ventricle 07/11/2014   OSA (obstructive sleep apnea) 04/07/2013   Multinodular goiter 01/20/2013   Cervical spondylosis without myelopathy 01/18/2013   Atrial  fibrillation with RVR ([place]) 10/27/2012   Left ventricular dysfunction 12/17/2011   Low back pain 09/11/2011   Fatigue 05/22/2011   PVC (premature ventricular contraction) 01/18/2011   Persistent atrial fibrillation ([place]) 01/12/2011   Hyperlipidemia 08/02/2010   Hypertension 08/02/2010   Osteoarthritis 08/02/2010   BPH (benign prostatic hyperplasia) 08/02/2010   GERD (gastroesophageal reflux disease) 08/02/2010   h/o Atrial flutter [facility]) s/p ablation 2012     Sumner Boast, PT 05/31/2021, 3:29 PM  [place]. [place], [place], [address] Phone: [phone]   Fax:  [phone]  Name: KAILAND SEDA MRN: [id number] Date of Birth: 06/12/1933

## 2021-06-04 ENCOUNTER — Other Ambulatory Visit: Payer: Self-pay

## 2021-06-04 ENCOUNTER — Ambulatory Visit: Payer: Medicare PPO | Admitting: Physical Therapy

## 2021-06-04 ENCOUNTER — Encounter: Payer: Self-pay | Admitting: Physical Therapy

## 2021-06-04 DIAGNOSIS — R2681 Unsteadiness on feet: Secondary | ICD-10-CM | POA: Diagnosis not present

## 2021-06-04 DIAGNOSIS — R252 Cramp and spasm: Secondary | ICD-10-CM

## 2021-06-04 DIAGNOSIS — M542 Cervicalgia: Secondary | ICD-10-CM | POA: Diagnosis not present

## 2021-06-04 DIAGNOSIS — R262 Difficulty in walking, not elsewhere classified: Secondary | ICD-10-CM

## 2021-06-04 DIAGNOSIS — M6281 Muscle weakness (generalized): Secondary | ICD-10-CM | POA: Diagnosis not present

## 2021-06-04 NOTE — Therapy (Signed)
[place]. [place], [place], [address] Phone: [phone]   Fax:  [phone]  Physical Therapy Treatment  Patient Details  Name: Connor [name] MRN: [id number] Date of Birth: 12-03-1933 Referring Provider (PT): Copland   Encounter Date: 06/04/2021   PT End of Session - 06/04/21 1119     Visit Number 14    Date for PT Re-Evaluation 07/17/21    Authorization Type [employer]    Authorization Time Period 3/12    PT Start Time 1007    PT Stop Time 1055    PT Time Calculation (min) 48 min    Activity Tolerance Patient tolerated treatment well    Behavior During Therapy [facility] for tasks assessed/performed             Past Medical History:  Diagnosis Date   Benign localized prostatic hyperplasia with lower urinary tract symptoms (LUTS)    urologist-- dr Diona Fanti   Chronic anticoagulation    Xarelto for afib   Chronic constipation    Chronic cystitis    Gross hematuria    History of DVT of lower extremity 06/08/2010   right femoral dvt dx post op total hip 05-08-2010   History of gastric ulcer    remote   History of kidney stones    one stone. remote hx   Hyperlipidemia    Hypertension    Dx by Dr. Lance Muss around age  88    NICM (nonischemic cardiomyopathy) ([place])    per echo 06-11-2017,  ef 45-50% with diffuse hypokinesis,  G1DD   OSA on CPAP 2017   compliant with CPAP.  managed by Dr Elsworth Soho.  88 Osteoarthritis    Other urethral stricture, male, meatal    chronic;  pt self dilates   Paroxysmal atrial fibrillation [facility]) cardiologist-- dr Rayann Heman   first dx 2009/   a. documented by Dr Barrett Shell on office EKG 9/12. b. maintained on Tikosyn and Xarelto. c. s/p DCCV 's.  d.  x3  EP w/ ablation atrial fib last one 12-05-2016   Prediabetes    Premature ventricular contraction    PVC's (premature ventricular contractions)    RA (rheumatoid arthritis) ([place])    rheumatologist--- Dr Page Spiro,  treated with Humira    RBBB (right bundle branch block)    Hx of a fib, ablation Aug 2018   Rheumatoid arthritis ([place])    Vitamin D deficiency    Wears hearing aid in both ears     Past Surgical History:  Procedure Laterality Date   ABLATION OF DYSRHYTHMIC FOCUS  08/29/2015   ATRIAL FIBRILLATION ABLATION  12/05/2016   ATRIAL FIBRILLATION ABLATION N/A 12/05/2016   Procedure: Atrial Fibrillation Ablation;  Surgeon: [name], MD;  Location: [place] [facility];  Service: Cardiovascular;  Laterality: N/A;   ATRIAL FIBRILLATION ABLATION N/A 02/29/2020   Procedure: ATRIAL FIBRILLATION ABLATION;  Surgeon: [name], MD;  Location: [place] [facility];  Service: Cardiovascular;  Laterality: N/A;   ATRIAL FLUTTER ABLATION  09/2010   by Glenford Bayley Right 1990s   CARDIOVERSION N/A 10/28/2012   Procedure: CARDIOVERSION;  Surgeon: Burnell Blanks, MD;  Location: [place];  Service: Cardiovascular;  Laterality: N/A;   CATARACT EXTRACTION W/ INTRAOCULAR LENS  IMPLANT, BILATERAL  2019   CYSTOSCOPY WITH BIOPSY N/A 09/03/2018   Procedure: CYSTOSCOPY WITH FULGURATION Fran Lowes;  Surgeon: Franchot Gallo, MD;  [facility];  Service: Urology;  Laterality: N/A;  30 MINS  ELECTROPHYSIOLOGIC STUDY N/A 08/29/2015   Procedure: Atrial Fibrillation Ablation;  Surgeon: [name], MD;  Location: [place] [facility];  Service: Cardiovascular;  Laterality: N/A;   KNEE SURGERY Bilateral x3   1960s & 1970s   open   LEFT HEART CATHETERIZATION WITH CORONARY ANGIOGRAM N/A 06/07/2011   Procedure: LEFT HEART CATHETERIZATION WITH CORONARY ANGIOGRAM;  Surgeon: Peter M Martinique, MD;  Location: [facility] C[facility];  Service: Cardiovascular;  Laterality: N/A;    Normal coronary arteries,  low normal LVF   TONSILLECTOMY AND ADENOIDECTOMY  age 50   TOTAL HIP ARTHROPLASTY Right 06/04/10    dr Noemi Chapel  _0    TOTAL KNEE ARTHROPLASTY Bilateral right 1995;  left [place]  06-29-2002   dr humphries  _1     TRANSURETHRAL RESECTION OF PROSTATE N/A 07/06/2018   Procedure: TRANSURETHRAL RESECTION OF THE PROSTATE (TURP);  Surgeon: Franchot Gallo, MD;  Location: [facility];  Service: Urology;  Laterality: N/A;  45 MINS    There were no vitals filed for this visit.   Subjective Assessment - 06/04/21 1010     Subjective I was pretty good for a while but then started hurting, i startred taking prednisone but the doctor did not tell me how much to take    Currently in Pain? No/denies                               [facility] Adult PT Treatment/Exercise - 06/04/21 0001       Knee/Hip Exercises: Aerobic   Recumbent Bike level\4 x 6 minutes    Nustep Level 5 x 6 minutes      Knee/Hip Exercises: Machines for Strengthening   Cybex Knee Extension 5# 3x10    Cybex Knee Flexion 35# 2x10    Other Machine seated row, lats 25# 3x10, chest press 10# 2x10, biceps, 25# triceps      Knee/Hip Exercises: Supine   Bridges with [employer] 15 reps    Bridges with Clamshell 15 reps    Other Supine Knee/Hip Exercises feet on ball K2C, trunk rotation, small bridges and isometric abs      Manual Therapy   Manual therapy comments some passive left hip motions with some gentle distraction                       PT Short Term Goals - 04/17/21 1143       PT SHORT TERM GOAL #1   Title Pt will be I and compliant with initial HEP.    Status Partially Met               PT Long Term Goals - 05/31/21 1528       PT LONG TERM GOAL #1   Title Pt will be independent with long term HEP for improved posture.    Status Partially Met      PT LONG TERM GOAL #2   Title Pt will walk for 30 minutes with no report of lightheadedness, as needed for daily exercise and errands.    Status On-going                   Plan - 06/04/21 1120     Clinical Impression Statement Patient reports that he was doing well until he laid on the couch yesterday, reports after  about an hour he got up and the left hip hurt and  has continued to hurt, he reports that he is unsure how much prednisone to take.  I tried to decrease the weight bearing activities today and then tried some manual distraction of the hip    PT Next Visit Plan see if his hip does okay and work on it as needed per MD    Consulted and Agree with Plan of Care Patient             Patient will benefit from skilled therapeutic intervention in order to improve the following deficits and impairments:  Decreased coordination, Decreased range of motion, Difficulty walking, Decreased endurance, Cardiopulmonary status limiting activity, Decreased activity tolerance, Decreased strength, Decreased mobility, Decreased balance  Visit Diagnosis: Muscle weakness (generalized)  Difficulty in walking, not elsewhere classified  Unsteadiness on feet  Cervicalgia  Cramp and spasm     Problem List Patient Active Problem List   Diagnosis Date Noted   Secondary hypercoagulable state ([place]) 03/28/2020   Malignant neoplasm of prostate ([place]) 09/11/2018   Enlarged prostate with urinary obstruction 07/06/2018   Gastrocnemius strain, right, initial encounter 12/10/2017   Baker's cyst of knee, right 12/10/2017   Obesity 03/24/2017   Paroxysmal atrial fibrillation ([place]) 12/05/2016   Near syncope 04/20/2016   Leg hematoma, left, initial encounter 04/20/2016   Bleeding    Ecchymosis    Left hamstring muscle strain    Muscle tear    RBBB 25/36/6440   Chronic systolic dysfunction of left ventricle 07/11/2014   OSA (obstructive sleep apnea) 04/07/2013   Multinodular goiter 01/20/2013   Cervical spondylosis without myelopathy 01/18/2013   Atrial fibrillation with RVR ([place]) 10/27/2012   Left ventricular dysfunction 12/17/2011   Low back pain 09/11/2011   Fatigue 05/22/2011   PVC (premature ventricular contraction) 01/18/2011   Persistent atrial fibrillation ([place]) 01/12/2011   Hyperlipidemia 08/02/2010    Hypertension 08/02/2010   Osteoarthritis 08/02/2010   BPH (benign prostatic hyperplasia) 08/02/2010   GERD (gastroesophageal reflux disease) 08/02/2010   h/o Atrial flutter [facility]) s/p ablation 2012     Sumner Boast, PT 06/04/2021, 11:22 AM  [place]. [place], [place], [address] Phone: [phone]   Fax:  [phone]  Name: DEIGO ALONSO MRN: [id number] Date of Birth: Jul 13, 1933

## 2021-06-07 ENCOUNTER — Ambulatory Visit: Payer: Medicare PPO | Attending: Family Medicine | Admitting: Physical Therapy

## 2021-06-07 ENCOUNTER — Encounter: Payer: Self-pay | Admitting: Physical Therapy

## 2021-06-07 ENCOUNTER — Other Ambulatory Visit: Payer: Self-pay

## 2021-06-07 DIAGNOSIS — M542 Cervicalgia: Secondary | ICD-10-CM | POA: Diagnosis not present

## 2021-06-07 DIAGNOSIS — R262 Difficulty in walking, not elsewhere classified: Secondary | ICD-10-CM | POA: Diagnosis not present

## 2021-06-07 DIAGNOSIS — R2681 Unsteadiness on feet: Secondary | ICD-10-CM | POA: Insufficient documentation

## 2021-06-07 DIAGNOSIS — R252 Cramp and spasm: Secondary | ICD-10-CM | POA: Insufficient documentation

## 2021-06-07 DIAGNOSIS — M6281 Muscle weakness (generalized): Secondary | ICD-10-CM | POA: Diagnosis not present

## 2021-06-07 NOTE — Therapy (Signed)
[place] [place], [place], [address] Phone: [phone]   Fax:  [phone]  Physical Therapy Treatment  Patient Details  Name: Connor [name] MRN: [id number] Date of Birth: February 05, 1934 Referring Provider (PT): Copland   Encounter Date: 06/07/2021   PT End of Session - 06/07/21 1350     Visit Number 15    Date for PT Re-Evaluation 07/17/21    Authorization Type [employer]    Authorization Time Period 4/12    PT Start Time 1303    PT Stop Time 1404    PT Time Calculation (min) 61 min    Activity Tolerance Patient tolerated treatment well    Behavior During Therapy [facility] for tasks assessed/performed             Past Medical History:  Diagnosis Date   Benign localized prostatic hyperplasia with lower urinary tract symptoms (LUTS)    urologist-- dr Diona Fanti   Chronic anticoagulation    Xarelto for afib   Chronic constipation    Chronic cystitis    Gross hematuria    History of DVT of lower extremity 06/08/2010   right femoral dvt dx post op total hip 05-08-2010   History of gastric ulcer    remote   History of kidney stones    one stone. remote hx   Hyperlipidemia    Hypertension    Dx by Dr. Lance Muss around age  37    NICM (nonischemic cardiomyopathy) ([place])    per echo 06-11-2017,  ef 45-50% with diffuse hypokinesis,  G1DD   OSA on CPAP 2017   compliant with CPAP.  managed by Dr Elsworth Soho.   Osteoarthritis    Other urethral stricture, male, meatal    chronic;  pt self dilates   Paroxysmal atrial fibrillation [facility]) cardiologist-- dr Rayann Heman   first dx 2009/   a. documented by Dr Barrett Shell on office EKG 9/12. b. maintained on Tikosyn and Xarelto. c. s/p DCCV 's.  d.  x3  EP w/ ablation atrial fib last one 12-05-2016   Prediabetes    Premature ventricular contraction    PVC's (premature ventricular contractions)    RA (rheumatoid arthritis) ([place])    rheumatologist--- Dr Page Spiro,  treated with Humira    RBBB (right bundle branch block)    Hx of a fib, ablation Aug 2018   Rheumatoid arthritis ([place])    Vitamin D deficiency    Wears hearing aid in both ears     Past Surgical History:  Procedure Laterality Date   ABLATION OF DYSRHYTHMIC FOCUS  08/29/2015   ATRIAL FIBRILLATION ABLATION  12/05/2016   ATRIAL FIBRILLATION ABLATION N/A 12/05/2016   Procedure: Atrial Fibrillation Ablation;  Surgeon: [name], MD;  Location: [place] [facility];  Service: Cardiovascular;  Laterality: N/A;   ATRIAL FIBRILLATION ABLATION N/A 02/29/2020   Procedure: ATRIAL FIBRILLATION ABLATION;  Surgeon: [name], MD;  Location: [place] [facility];  Service: Cardiovascular;  Laterality: N/A;   ATRIAL FLUTTER ABLATION  09/2010   by Glenford Bayley Right 1990s   CARDIOVERSION N/A 10/28/2012   Procedure: CARDIOVERSION;  Surgeon: Burnell Blanks, MD;  Location: [place];  Service: Cardiovascular;  Laterality: N/A;   CATARACT EXTRACTION W/ INTRAOCULAR LENS  IMPLANT, BILATERAL  2019   CYSTOSCOPY WITH BIOPSY N/A 09/03/2018   Procedure: CYSTOSCOPY WITH FULGURATION Fran Lowes;  Surgeon: Franchot Gallo, MD;  Location: [facility];  Service: Urology;  Laterality: N/A;  30 MINS  ELECTROPHYSIOLOGIC STUDY N/A 08/29/2015   Procedure: Atrial Fibrillation Ablation;  Surgeon: [name], MD;  Location: [place] [facility];  Service: Cardiovascular;  Laterality: N/A;   KNEE SURGERY Bilateral x3   1960s & 1970s   open   LEFT HEART CATHETERIZATION WITH CORONARY ANGIOGRAM N/A 06/07/2011   Procedure: LEFT HEART CATHETERIZATION WITH CORONARY ANGIOGRAM;  Surgeon: Peter M Martinique, MD;  Location: [facility] C[facility];  Service: Cardiovascular;  Laterality: N/A;    Normal coronary arteries,  low normal LVF   TONSILLECTOMY AND ADENOIDECTOMY  age 30   TOTAL HIP ARTHROPLASTY Right 06/04/10    dr Noemi Chapel  '@[facility]'    TOTAL KNEE ARTHROPLASTY Bilateral right 1995;  left [place]  06-29-2002   dr humphries  '@[facility]'     TRANSURETHRAL RESECTION OF PROSTATE N/A 07/06/2018   Procedure: TRANSURETHRAL RESECTION OF THE PROSTATE (TURP);  Surgeon: Franchot Gallo, MD;  Location: [facility];  Service: Urology;  Laterality: N/A;  45 MINS    There were no vitals filed for this visit.   Subjective Assessment - 06/07/21 1334     Subjective I am still hurting in the hip, been taking the prednisone, not sure it s helping    Currently in Pain? Yes    Pain Score 5     Pain Location Hip    Pain Orientation Left    Aggravating Factors  standing, walking                               [facility] Adult PT Treatment/Exercise - 06/07/21 0001       Knee/Hip Exercises: Stretches   Piriformis Stretch Left;4 reps;20 seconds    Piriformis Stretch Limitations gentle    Other Knee/Hip Stretches passive left hip distraction, and knee to chest      Knee/Hip Exercises: Aerobic   Recumbent Bike level\4 x 6 minutes    Nustep Level 5 x 6 minutes      Knee/Hip Exercises: Machines for Strengthening   Other Machine seated rows 1x10 25#/1x10 35#; lats 1x15 25#, 1x10 35#      Knee/Hip Exercises: Supine   Other Supine Knee/Hip Exercises feet on ball K2C, trunk rotation, small bridges and isometric abs      Modalities   Modalities Moist Heat;Electrical Stimulation      Moist Heat Therapy   Number Minutes Moist Heat 10 Minutes    Moist Heat Location Hip      Electrical Stimulation   Electrical Stimulation Location left posterior hip area    Electrical Stimulation Action IFC    Electrical Stimulation Parameters supine    Electrical Stimulation Goals Pain      Manual Therapy   Manual therapy comments some passive left hip motions with some gentle distraction                       PT Short Term Goals - 04/17/21 1143       PT SHORT TERM GOAL #1   Title Pt will be I and compliant with initial HEP.    Status Partially Met               PT Long Term Goals - 06/07/21 1353        PT LONG TERM GOAL #1   Title Pt will be independent with long term HEP for improved posture.    Status Partially Met  PT LONG TERM GOAL #2   Title Pt will walk for 30 minutes with no report of lightheadedness, as needed for daily exercise and errands.    Status Partially Met      PT LONG TERM GOAL #3   Title decrease TUG to 12 seconds    Status Partially Met                   Plan - 06/07/21 1352     Clinical Impression Statement Patient reports that he has continued to have left hip pain, prednisone is not helping.  I added some stretches and added the estim to see if we can help this.  He reports that the gentle stretches and distraction feels good    PT Next Visit Plan see how the IFC did    Consulted and Agree with Plan of Care Patient             Patient will benefit from skilled therapeutic intervention in order to improve the following deficits and impairments:  Decreased coordination, Decreased range of motion, Difficulty walking, Decreased endurance, Cardiopulmonary status limiting activity, Decreased activity tolerance, Decreased strength, Decreased mobility, Decreased balance  Visit Diagnosis: Muscle weakness (generalized)  Difficulty in walking, not elsewhere classified  Unsteadiness on feet  Cervicalgia  Cramp and spasm     Problem List Patient Active Problem List   Diagnosis Date Noted   Secondary hypercoagulable state ([place]) 03/28/2020   Malignant neoplasm of prostate ([place]) 09/11/2018   Enlarged prostate with urinary obstruction 07/06/2018   Gastrocnemius strain, right, initial encounter 12/10/2017   Baker's cyst of knee, right 12/10/2017   Obesity 03/24/2017   Paroxysmal atrial fibrillation ([place]) 12/05/2016   Near syncope 04/20/2016   Leg hematoma, left, initial encounter 04/20/2016   Bleeding    Ecchymosis    Left hamstring muscle strain    Muscle tear    RBBB 95/74/7340   Chronic systolic dysfunction of left ventricle  07/11/2014   OSA (obstructive sleep apnea) 04/07/2013   Multinodular goiter 01/20/2013   Cervical spondylosis without myelopathy 01/18/2013   Atrial fibrillation with RVR ([place]) 10/27/2012   Left ventricular dysfunction 12/17/2011   Low back pain 09/11/2011   Fatigue 05/22/2011   PVC (premature ventricular contraction) 01/18/2011   Persistent atrial fibrillation ([place]) 01/12/2011   Hyperlipidemia 08/02/2010   Hypertension 08/02/2010   Osteoarthritis 08/02/2010   BPH (benign prostatic hyperplasia) 08/02/2010   GERD (gastroesophageal reflux disease) 08/02/2010   h/o Atrial flutter [facility]) s/p ablation 2012     Sumner Boast, PT 06/07/2021, 1:54 PM  [place]. [place], [place], [address] Phone: [phone]   Fax:  [phone]  Name: FAROOQ PETROVICH MRN: [id number] Date of Birth: 28-Dec-1933

## 2021-06-13 ENCOUNTER — Encounter: Payer: Self-pay | Admitting: Physical Therapy

## 2021-06-13 ENCOUNTER — Other Ambulatory Visit: Payer: Self-pay

## 2021-06-13 ENCOUNTER — Ambulatory Visit: Payer: Medicare PPO | Admitting: Physical Therapy

## 2021-06-13 DIAGNOSIS — R262 Difficulty in walking, not elsewhere classified: Secondary | ICD-10-CM | POA: Diagnosis not present

## 2021-06-13 DIAGNOSIS — R2681 Unsteadiness on feet: Secondary | ICD-10-CM | POA: Diagnosis not present

## 2021-06-13 DIAGNOSIS — M6281 Muscle weakness (generalized): Secondary | ICD-10-CM | POA: Diagnosis not present

## 2021-06-13 DIAGNOSIS — M542 Cervicalgia: Secondary | ICD-10-CM

## 2021-06-13 DIAGNOSIS — R252 Cramp and spasm: Secondary | ICD-10-CM | POA: Diagnosis not present

## 2021-06-13 NOTE — Therapy (Signed)
[place]. [place], [place], [address] Phone: [phone]   Fax:  [phone]  Physical Therapy Treatment  Patient Details  Name: Connor [name] MRN: [id number] Date of Birth: 02/08/1934 Referring Provider (PT): Copland   Encounter Date: 06/13/2021   PT End of Session - 06/13/21 1655     Visit Number 16    Date for PT Re-Evaluation 07/17/21    Authorization Type [employer]    Authorization Time Period 5/12/    PT Start Time 1614    PT Stop Time 1659    PT Time Calculation (min) 45 min    Activity Tolerance Patient tolerated treatment well    Behavior During Therapy [facility] for tasks assessed/performed             Past Medical History:  Diagnosis Date   Benign localized prostatic hyperplasia with lower urinary tract symptoms (LUTS)    urologist-- dr Diona Fanti   Chronic anticoagulation    Xarelto for afib   Chronic constipation    Chronic cystitis    Gross hematuria    History of DVT of lower extremity 06/08/2010   right femoral dvt dx post op total hip 05-08-2010   History of gastric ulcer    remote   History of kidney stones    one stone. remote hx   Hyperlipidemia    Hypertension    Dx by Dr. Lance Muss around age  70    NICM (nonischemic cardiomyopathy) ([place])    per echo 06-11-2017,  ef 45-50% with diffuse hypokinesis,  G1DD   OSA on CPAP 2017   compliant with CPAP.  managed by Dr Elsworth Soho.   Osteoarthritis    Other urethral stricture, male, meatal    chronic;  pt self dilates   Paroxysmal atrial fibrillation [facility]) cardiologist-- dr Rayann Heman   first dx 2009/   a. documented by Dr Barrett Shell on office EKG 9/12. b. maintained on Tikosyn and Xarelto. c. s/p DCCV 's.  d.  x3  EP w/ ablation atrial fib last one 12-05-2016   Prediabetes    Premature ventricular contraction    PVC's (premature ventricular contractions)    RA (rheumatoid arthritis) ([place])    rheumatologist--- Dr Page Spiro,  treated with Humira    RBBB (right bundle branch block)    Hx of a fib, ablation Aug 2018   Rheumatoid arthritis ([place])    Vitamin D deficiency    Wears hearing aid in both ears     Past Surgical History:  Procedure Laterality Date   ABLATION OF DYSRHYTHMIC FOCUS  08/29/2015   ATRIAL FIBRILLATION ABLATION  12/05/2016   ATRIAL FIBRILLATION ABLATION N/A 12/05/2016   Procedure: Atrial Fibrillation Ablation;  Surgeon: [name], MD;  Location: [place] [facility];  Service: Cardiovascular;  Laterality: N/A;   ATRIAL FIBRILLATION ABLATION N/A 02/29/2020   Procedure: ATRIAL FIBRILLATION ABLATION;  Surgeon: [name], MD;  Location: [place] [facility];  Service: Cardiovascular;  Laterality: N/A;   ATRIAL FLUTTER ABLATION  09/2010   by Glenford Bayley Right 1990s   CARDIOVERSION N/A 10/28/2012   Procedure: CARDIOVERSION;  Surgeon: Burnell Blanks, MD;  Location: [place];  Service: Cardiovascular;  Laterality: N/A;   CATARACT EXTRACTION W/ INTRAOCULAR LENS  IMPLANT, BILATERAL  2019   CYSTOSCOPY WITH BIOPSY N/A 09/03/2018   Procedure: CYSTOSCOPY WITH FULGURATION Fran Lowes;  Surgeon: Franchot Gallo, MD;  Location: [facility];  Service: Urology;  Laterality: N/A;  30 MINS  ELECTROPHYSIOLOGIC STUDY N/A 08/29/2015   Procedure: Atrial Fibrillation Ablation;  Surgeon: [name], MD;  Location: [place] [facility];  Service: Cardiovascular;  Laterality: N/A;   KNEE SURGERY Bilateral x3   1960s & 1970s   open   LEFT HEART CATHETERIZATION WITH CORONARY ANGIOGRAM N/A 06/07/2011   Procedure: LEFT HEART CATHETERIZATION WITH CORONARY ANGIOGRAM;  Surgeon: Peter M Martinique, MD;  Location: [facility] CATH LAB;  Service: Cardiovascular;  Laterality: N/A;    Normal coronary arteries,  low normal LVF   TONSILLECTOMY AND ADENOIDECTOMY  age 59   TOTAL HIP ARTHROPLASTY Right 06/04/10    dr Noemi Chapel  _0    TOTAL KNEE ARTHROPLASTY Bilateral right 1995;  left [place]  06-29-2002   dr humphries  _1     TRANSURETHRAL RESECTION OF PROSTATE N/A 07/06/2018   Procedure: TRANSURETHRAL RESECTION OF THE PROSTATE (TURP);  Surgeon: Franchot Gallo, MD;  Location[facility];  Service: Urology;  Laterality: N/A;  45 MINS    There were no vitals filed for this visit.   Subjective Assessment - 06/13/21 1615     Subjective Patient reports that he thought what we did lasst time helped but still hurting, he called the MD, he has an MRI scheduled for tmorrow    Currently in Pain? Yes    Pain Score 5     Pain Location Hip    Pain Orientation Left    Pain Descriptors / Indicators Sore;Aching    Pain Relieving Factors the last treatment helped                               [facility] Adult PT Treatment/Exercise - 06/13/21 0001       Knee/Hip Exercises: Stretches   Passive Hamstring Stretch Left;4 reps;20 seconds    Piriformis Stretch Left;4 reps;20 seconds    Piriformis Stretch Limitations gentle    Other Knee/Hip Stretches passive knee to chest    Other Knee/Hip Stretches passive left hip distraction, and knee to chest      Knee/Hip Exercises: Aerobic   Recumbent Bike level\4 x 6 minutes    Nustep Level 5 x 6 minutes      Knee/Hip Exercises: Machines for Strengthening   Cybex Knee Extension 5# 3x10    Cybex Knee Flexion 35# 2x10    Other Machine seated rows 2x10 25#; lats 2x15 25#, 1x10 35#      Knee/Hip Exercises: Supine   Other Supine Knee/Hip Exercises feet on ball K2C, trunk rotation, small bridges and isometric abs      Knee/Hip Exercises: Sidelying   Hip ABduction PROM                       PT Short Term Goals - 04/17/21 1143       PT SHORT TERM GOAL #1   Title Pt will be I and compliant with initial HEP.    Status Partially Met               PT Long Term Goals - 06/13/21 1658       PT LONG TERM GOAL #1   Title Pt will be independent with long term HEP for improved posture.    Status Partially Met      PT LONG TERM  GOAL #2   Title Pt will walk for 30 minutes with no report of lightheadedness, as needed for daily exercise and  errands.    Status Partially Met      PT LONG TERM GOAL #3   Title decrease TUG to 12 seconds    Status Partially Met                   Plan - 06/13/21 1656     Clinical Impression Statement Patient called the Md, the MD has scheduled an MRI for tomorrow, he is still having the pain, he does report feeling a little better.  Weight bearing activity and stretches seem to make it worse.    PT Next Visit Plan hopefully see what the MRI says and progress accordingly    Consulted and Agree with Plan of Care Patient             Patient will benefit from skilled therapeutic intervention in order to improve the following deficits and impairments:  Decreased coordination, Decreased range of motion, Difficulty walking, Decreased endurance, Cardiopulmonary status limiting activity, Decreased activity tolerance, Decreased strength, Decreased mobility, Decreased balance  Visit Diagnosis: Muscle weakness (generalized)  Difficulty in walking, not elsewhere classified  Unsteadiness on feet  Cervicalgia     Problem List Patient Active Problem List   Diagnosis Date Noted   Secondary hypercoagulable state ([place]) 03/28/2020   Malignant neoplasm of prostate ([place]) 09/11/2018   Enlarged prostate with urinary obstruction 07/06/2018   Gastrocnemius strain, right, initial encounter 12/10/2017   Baker's cyst of knee, right 12/10/2017   Obesity 03/24/2017   Paroxysmal atrial fibrillation ([place]) 12/05/2016   Near syncope 04/20/2016   Leg hematoma, left, initial encounter 04/20/2016   Bleeding    Ecchymosis    Left hamstring muscle strain    Muscle tear    RBBB 04/59/1368   Chronic systolic dysfunction of left ventricle 07/11/2014   OSA (obstructive sleep apnea) 04/07/2013   Multinodular goiter 01/20/2013   Cervical spondylosis without myelopathy 01/18/2013   Atrial  fibrillation with RVR ([place]) 10/27/2012   Left ventricular dysfunction 12/17/2011   Low back pain 09/11/2011   Fatigue 05/22/2011   PVC (premature ventricular contraction) 01/18/2011   Persistent atrial fibrillation ([place]) 01/12/2011   Hyperlipidemia 08/02/2010   Hypertension 08/02/2010   Osteoarthritis 08/02/2010   BPH (benign prostatic hyperplasia) 08/02/2010   GERD (gastroesophageal reflux disease) 08/02/2010   h/o Atrial flutter [facility]) s/p ablation 2012     Sumner Boast, PT 06/13/2021, 4:59 PM  [place]. [place], [place], [address] Phone: [phone]   Fax:  [phone]  Name: Connor [name] MRN: [id number] Date of Birth: 19-Jun-1933

## 2021-06-14 ENCOUNTER — Ambulatory Visit: Payer: Medicare PPO

## 2021-06-14 DIAGNOSIS — M545 Low back pain, unspecified: Secondary | ICD-10-CM | POA: Diagnosis not present

## 2021-06-14 DIAGNOSIS — M25552 Pain in left hip: Secondary | ICD-10-CM | POA: Diagnosis not present

## 2021-06-19 ENCOUNTER — Other Ambulatory Visit: Payer: Self-pay

## 2021-06-19 ENCOUNTER — Ambulatory Visit: Payer: Medicare PPO | Admitting: Physical Therapy

## 2021-06-19 ENCOUNTER — Encounter: Payer: Self-pay | Admitting: Physical Therapy

## 2021-06-19 DIAGNOSIS — M6281 Muscle weakness (generalized): Secondary | ICD-10-CM

## 2021-06-19 DIAGNOSIS — R262 Difficulty in walking, not elsewhere classified: Secondary | ICD-10-CM | POA: Diagnosis not present

## 2021-06-19 DIAGNOSIS — M542 Cervicalgia: Secondary | ICD-10-CM | POA: Diagnosis not present

## 2021-06-19 DIAGNOSIS — R2681 Unsteadiness on feet: Secondary | ICD-10-CM

## 2021-06-19 DIAGNOSIS — R252 Cramp and spasm: Secondary | ICD-10-CM | POA: Diagnosis not present

## 2021-06-19 NOTE — Therapy (Signed)
[place]. [place], [place], [address] Phone: [phone]   Fax:  [phone]  Physical Therapy Treatment  Patient Details  Name: Connor [name] MRN: [id number] Date of Birth: 1933-09-18 Referring Provider (PT): Copland   Encounter Date: 06/19/2021   PT End of Session - 06/19/21 1648     Visit Number 17    Date for PT Re-Evaluation 07/17/21    Authorization Type [employer]    Authorization Time Period 6/12    PT Start Time 1525    PT Stop Time 1611    PT Time Calculation (min) 46 min    Activity Tolerance Patient tolerated treatment well    Behavior During Therapy [facility] for tasks assessed/performed             Past Medical History:  Diagnosis Date   Benign localized prostatic hyperplasia with lower urinary tract symptoms (LUTS)    urologist-- dr Diona Fanti   Chronic anticoagulation    Xarelto for afib   Chronic constipation    Chronic cystitis    Gross hematuria    History of DVT of lower extremity 06/08/2010   right femoral dvt dx post op total hip 05-08-2010   History of gastric ulcer    remote   History of kidney stones    one stone. remote hx   Hyperlipidemia    Hypertension    Dx by Dr. Lance Muss around age  61    NICM (nonischemic cardiomyopathy) ([place])    per echo 06-11-2017,  ef 45-50% with diffuse hypokinesis,  G1DD   OSA on CPAP 2017   compliant with CPAP.  managed by Dr Elsworth Soho.   Osteoarthritis    Other urethral stricture, male, meatal    chronic;  pt self dilates   Paroxysmal atrial fibrillation [facility]) cardiologist-- dr Rayann Heman   first dx 2009/   a. documented by Dr Barrett Shell on office EKG 9/12. b. maintained on Tikosyn and Xarelto. c. s/p DCCV 's.  d.  x3  EP w/ ablation atrial fib last one 12-05-2016   Prediabetes    Premature ventricular contraction    PVC's (premature ventricular contractions)    RA (rheumatoid arthritis) ([place])    rheumatologist--- Dr Page Spiro,  treated with Humira    RBBB (right bundle branch block)    Hx of a fib, ablation Aug 2018   Rheumatoid arthritis ([place])    Vitamin D deficiency    Wears hearing aid in both ears     Past Surgical History:  Procedure Laterality Date   ABLATION OF DYSRHYTHMIC FOCUS  08/29/2015   ATRIAL FIBRILLATION ABLATION  12/05/2016   ATRIAL FIBRILLATION ABLATION N/A 12/05/2016   Procedure: Atrial Fibrillation Ablation;  Surgeon: [name], MD;  Location: [place] CV LAB;  Service: Cardiovascular;  Laterality: N/A;   ATRIAL FIBRILLATION ABLATION N/A 02/29/2020   Procedure: ATRIAL FIBRILLATION ABLATION;  Surgeon: [name], MD;  Location: [place] CV LAB;  Service: Cardiovascular;  Laterality: N/A;   ATRIAL FLUTTER ABLATION  09/2010   by Glenford Bayley Right 1990s   CARDIOVERSION N/A 10/28/2012   Procedure: CARDIOVERSION;  Surgeon: Burnell Blanks, MD;  Location: [place];  Service: Cardiovascular;  Laterality: N/A;   CATARACT EXTRACTION W/ INTRAOCULAR LENS  IMPLANT, BILATERAL  2019   CYSTOSCOPY WITH BIOPSY N/A 09/03/2018   Procedure: CYSTOSCOPY WITH FULGURATION Fran Lowes;  Surgeon: Franchot Gallo, MD;  Location: [facility];  Service: Urology;  Laterality: N/A;  30 MINS  ELECTROPHYSIOLOGIC STUDY N/A 08/29/2015   Procedure: Atrial Fibrillation Ablation;  Surgeon: [name], MD;  Location: [place] CV LAB;  Service: Cardiovascular;  Laterality: N/A;   KNEE SURGERY Bilateral x3   1960s & 1970s   open   LEFT HEART CATHETERIZATION WITH CORONARY ANGIOGRAM N/A 06/07/2011   Procedure: LEFT HEART CATHETERIZATION WITH CORONARY ANGIOGRAM;  Surgeon: Peter M Martinique, MD;  Location: [facility] CATH LAB;  Service: Cardiovascular;  Laterality: N/A;    Normal coronary arteries,  low normal LVF   TONSILLECTOMY AND ADENOIDECTOMY  age 51   TOTAL HIP ARTHROPLASTY Right 06/04/10    dr Noemi Chapel  '@[facility]'    TOTAL KNEE ARTHROPLASTY Bilateral right 1995;  left 1997   TRANSURETHRAL RESECTION OF PROSTATE  06-29-2002   dr humphries  '@[facility]'     TRANSURETHRAL RESECTION OF PROSTATE N/A 07/06/2018   Procedure: TRANSURETHRAL RESECTION OF THE PROSTATE (TURP);  Surgeon: Franchot Gallo, MD;  Location: [facility];  Service: Urology;  Laterality: N/A;  45 MINS    There were no vitals filed for this visit.   Subjective Assessment - 06/19/21 1535     Subjective Patient reports that he has a pinched nerve in the low back and a small "fracture" "an old one', reports that he will get an injection next Wednesday    Currently in Pain? Yes    Pain Score 1     Pain Location Hip    Pain Orientation Left    Aggravating Factors  i have been able to walk better this past week                               [facility] Adult PT Treatment/Exercise - 06/19/21 0001       Self-Care   Self-Care --    Other Self-Care Comments  --      Knee/Hip Exercises: Stretches   Piriformis Stretch Left;4 reps;20 seconds    Other Knee/Hip Stretches passive knee to chest      Knee/Hip Exercises: Aerobic   Recumbent Bike level\4 x 6 minutes    Nustep Level 5 x 6 minutes      Knee/Hip Exercises: Machines for Strengthening   Cybex Knee Extension 5# 3x10    Cybex Knee Flexion 35# 2x10    Other Machine seated rows and lats 35# 2x10      Knee/Hip Exercises: Supine   Other Supine Knee/Hip Exercises feet on ball K2C, trunk rotation, small bridges and isometric abs                       PT Short Term Goals - 04/17/21 1143       PT SHORT TERM GOAL #1   Title Pt will be I and compliant with initial HEP.    Status Partially Met               PT Long Term Goals - 06/13/21 1658       PT LONG TERM GOAL #1   Title Pt will be independent with long term HEP for improved posture.    Status Partially Met      PT LONG TERM GOAL #2   Title Pt will walk for 30 minutes with no report of lightheadedness, as needed for daily exercise and errands.    Status Partially Met      PT LONG TERM GOAL #3   Title decrease  TUG to 12 seconds  Status Partially Met                   Plan - 06/19/21 1703     Clinical Impression Statement PAtient reports that he saw MD about the MRI, has a pinched nerve in the back, also reports that he has an old fracture in teh back, no issue with the hip, he is pleased that he is feling better with what we have been doing, he will have an injection in the back next week.    PT Next Visit Plan since he is feeling better I will not change treatment until after his injection    Consulted and Agree with Plan of Care Patient             Patient will benefit from skilled therapeutic intervention in order to improve the following deficits and impairments:  Decreased coordination, Decreased range of motion, Difficulty walking, Decreased endurance, Cardiopulmonary status limiting activity, Decreased activity tolerance, Decreased strength, Decreased mobility, Decreased balance  Visit Diagnosis: Muscle weakness (generalized)  Difficulty in walking, not elsewhere classified  Unsteadiness on feet  Cervicalgia  Cramp and spasm     Problem List Patient Active Problem List   Diagnosis Date Noted   Secondary hypercoagulable state ([place]) 03/28/2020   Malignant neoplasm of prostate ([place]) 09/11/2018   Enlarged prostate with urinary obstruction 07/06/2018   Gastrocnemius strain, right, initial encounter 12/10/2017   Baker's cyst of knee, right 12/10/2017   Obesity 03/24/2017   Paroxysmal atrial fibrillation ([place]) 12/05/2016   Near syncope 04/20/2016   Leg hematoma, left, initial encounter 04/20/2016   Bleeding    Ecchymosis    Left hamstring muscle strain    Muscle tear    RBBB 59/45/8592   Chronic systolic dysfunction of left ventricle 07/11/2014   OSA (obstructive sleep apnea) 04/07/2013   Multinodular goiter 01/20/2013   Cervical spondylosis without myelopathy 01/18/2013   Atrial fibrillation with RVR ([place]) 10/27/2012   Left ventricular dysfunction  12/17/2011   Low back pain 09/11/2011   Fatigue 05/22/2011   PVC (premature ventricular contraction) 01/18/2011   Persistent atrial fibrillation ([place]) 01/12/2011   Hyperlipidemia 08/02/2010   Hypertension 08/02/2010   Osteoarthritis 08/02/2010   BPH (benign prostatic hyperplasia) 08/02/2010   GERD (gastroesophageal reflux disease) 08/02/2010   h/o Atrial flutter [facility]) s/p ablation 2012     Sumner Boast, PT 06/19/2021, 5:05 PM  [place]. [place], [place], [address] Phone: [phone]   Fax:  [phone]  Name: TOLBERT MATHESON MRN: [id number] Date of Birth: 1933/09/14

## 2021-06-20 ENCOUNTER — Encounter: Payer: Self-pay | Admitting: Physical Therapy

## 2021-06-20 ENCOUNTER — Ambulatory Visit: Payer: Medicare PPO | Admitting: Physical Therapy

## 2021-06-20 DIAGNOSIS — R2681 Unsteadiness on feet: Secondary | ICD-10-CM

## 2021-06-20 DIAGNOSIS — M542 Cervicalgia: Secondary | ICD-10-CM | POA: Diagnosis not present

## 2021-06-20 DIAGNOSIS — M6281 Muscle weakness (generalized): Secondary | ICD-10-CM | POA: Diagnosis not present

## 2021-06-20 DIAGNOSIS — R262 Difficulty in walking, not elsewhere classified: Secondary | ICD-10-CM | POA: Diagnosis not present

## 2021-06-20 DIAGNOSIS — R252 Cramp and spasm: Secondary | ICD-10-CM | POA: Diagnosis not present

## 2021-06-20 NOTE — Therapy (Signed)
[place]. [place], [place], [address] Phone: [phone]   Fax:  [phone]  Physical Therapy Treatment  Patient Details  Name: Connor [name] MRN: [id number] Date of Birth: 1933/07/12 Referring Provider (PT): Copland   Encounter Date: 06/20/2021   PT End of Session - 06/20/21 1523     Visit Number 18    Date for PT Re-Evaluation 07/17/21    Authorization Type [employer]    Authorization Time Period 7/12    PT Start Time 1440    PT Stop Time 1520    PT Time Calculation (min) 40 min    Activity Tolerance Patient tolerated treatment well    Behavior During Therapy [facility] for tasks assessed/performed             Past Medical History:  Diagnosis Date   Benign localized prostatic hyperplasia with lower urinary tract symptoms (LUTS)    urologist-- dr Diona Fanti   Chronic anticoagulation    Xarelto for afib   Chronic constipation    Chronic cystitis    Gross hematuria    History of DVT of lower extremity 06/08/2010   right femoral dvt dx post op total hip 05-08-2010   History of gastric ulcer    remote   History of kidney stones    one stone. remote hx   Hyperlipidemia    Hypertension    Dx by Dr. Lance Muss around age  52    NICM (nonischemic cardiomyopathy) ([place])    per echo 06-11-2017,  ef 45-50% with diffuse hypokinesis,  G1DD   OSA on CPAP 2017   compliant with CPAP.  managed by Dr Elsworth Soho.   Osteoarthritis    Other urethral stricture, male, meatal    chronic;  pt self dilates   Paroxysmal atrial fibrillation [facility]) cardiologist-- dr Rayann Heman   first dx 2009/   a. documented by Dr Barrett Shell on office EKG 9/12. b. maintained on Tikosyn and Xarelto. c. s/p DCCV 's.  d.  x3  EP w/ ablation atrial fib last one 12-05-2016   Prediabetes    Premature ventricular contraction    PVC's (premature ventricular contractions)    RA (rheumatoid arthritis) ([place])    rheumatologist--- Dr Page Spiro,  treated with Humira    RBBB (right bundle branch block)    Hx of a fib, ablation Aug 2018   Rheumatoid arthritis ([place])    Vitamin D deficiency    Wears hearing aid in both ears     Past Surgical History:  Procedure Laterality Date   ABLATION OF DYSRHYTHMIC FOCUS  08/29/2015   ATRIAL FIBRILLATION ABLATION  12/05/2016   ATRIAL FIBRILLATION ABLATION N/A 12/05/2016   Procedure: Atrial Fibrillation Ablation;  Surgeon: [name], MD;  Location: [place] CV LAB;  Service: Cardiovascular;  Laterality: N/A;   ATRIAL FIBRILLATION ABLATION N/A 02/29/2020   Procedure: ATRIAL FIBRILLATION ABLATION;  Surgeon: [name], MD;  Location: [place] CV LAB;  Service: Cardiovascular;  Laterality: N/A;   ATRIAL FLUTTER ABLATION  09/2010   by Glenford Bayley Right 1990s   CARDIOVERSION N/A 10/28/2012   Procedure: CARDIOVERSION;  Surgeon: Burnell Blanks, MD;  Location: [place];  Service: Cardiovascular;  Laterality: N/A;   CATARACT EXTRACTION W/ INTRAOCULAR LENS  IMPLANT, BILATERAL  2019   CYSTOSCOPY WITH BIOPSY N/A 09/03/2018   Procedure: CYSTOSCOPY WITH FULGURATION Fran Lowes;  Surgeon: Franchot Gallo, MD;  [facility];  Service: Urology;  Laterality: N/A;  30 MINS  ELECTROPHYSIOLOGIC STUDY N/A 08/29/2015   Procedure: Atrial Fibrillation Ablation;  Surgeon: [name], MD;  Location: [place] CV LAB;  Service: Cardiovascular;  Laterality: N/A;   KNEE SURGERY Bilateral x3   1960s & 1970s   open   LEFT HEART CATHETERIZATION WITH CORONARY ANGIOGRAM N/A 06/07/2011   Procedure: LEFT HEART CATHETERIZATION WITH CORONARY ANGIOGRAM;  Surgeon: Peter M Martinique, MD;  Location: [facility] CATH LAB;  Service: Cardiovascular;  Laterality: N/A;    Normal coronary arteries,  low normal LVF   TONSILLECTOMY AND ADENOIDECTOMY  age 64   TOTAL HIP ARTHROPLASTY Right 06/04/10    dr Noemi Chapel  '@[facility]'    TOTAL KNEE ARTHROPLASTY Bilateral right 1995;  left 1997   TRANSURETHRAL RESECTION OF PROSTATE  06-29-2002   dr humphries  '@[facility]'     TRANSURETHRAL RESECTION OF PROSTATE N/A 07/06/2018   Procedure: TRANSURETHRAL RESECTION OF THE PROSTATE (TURP);  Surgeon: Franchot Gallo, MD;  Location: [facility];  Service: Urology;  Laterality: N/A;  45 MINS    There were no vitals filed for this visit.   Subjective Assessment - 06/20/21 1441     Subjective Patient reports doing okay today    Currently in Pain? Yes    Pain Score 1     Pain Location Hip    Pain Orientation Left    Pain Descriptors / Indicators Sore                               [facility] Adult PT Treatment/Exercise - 06/20/21 0001       Knee/Hip Exercises: Stretches   Passive Hamstring Stretch Left;4 reps;20 seconds    Piriformis Stretch Left;4 reps;20 seconds    Other Knee/Hip Stretches passive knee to chest      Knee/Hip Exercises: Aerobic   Recumbent Bike level\4 x 6 minutes    Nustep Level 5 x 6 minutes      Knee/Hip Exercises: Machines for Strengthening   Other Machine seated rows and lats 35# 2x10, triceps 25#, biceps 10# all with cues to engage the core, 5# straight arm pulls for core      Knee/Hip Exercises: Supine   Bridges with [employer] 15 reps    Bridges with [employer] 15 reps    Other Supine Knee/Hip Exercises feet on ball K2C, trunk rotation, small bridges and isometric abs                       PT Short Term Goals - 04/17/21 1143       PT SHORT TERM GOAL #1   Title Pt will be I and compliant with initial HEP.    Status Partially Met               PT Long Term Goals - 06/20/21 1524       PT LONG TERM GOAL #1   Title Pt will be independent with long term HEP for improved posture.    Status Partially Met      PT LONG TERM GOAL #2   Title Pt will walk for 30 minutes with no report of lightheadedness, as needed for daily exercise and errands.    Status Partially Met      PT LONG TERM GOAL #3   Title decrease TUG to 12 seconds    Status Partially Met  Plan - 06/20/21 1523     Clinical Impression Statement Patient with some knee tightness iwth the bike, he reports that what we are doing is really helping, he is having less back and hip pain recently, feels stronger, I added some increased UE work with cues for core engagement and posture    PT Next Visit Plan since he is feeling better I will not change treatment until after his injection    Consulted and Agree with Plan of Care Patient             Patient will benefit from skilled therapeutic intervention in order to improve the following deficits and impairments:  Decreased coordination, Decreased range of motion, Difficulty walking, Decreased endurance, Cardiopulmonary status limiting activity, Decreased activity tolerance, Decreased strength, Decreased mobility, Decreased balance  Visit Diagnosis: Muscle weakness (generalized)  Difficulty in walking, not elsewhere classified  Unsteadiness on feet  Cervicalgia  Cramp and spasm     Problem List Patient Active Problem List   Diagnosis Date Noted   Secondary hypercoagulable state ([place]) 03/28/2020   Malignant neoplasm of prostate ([place]) 09/11/2018   Enlarged prostate with urinary obstruction 07/06/2018   Gastrocnemius strain, right, initial encounter 12/10/2017   Baker's cyst of knee, right 12/10/2017   Obesity 03/24/2017   Paroxysmal atrial fibrillation ([place]) 12/05/2016   Near syncope 04/20/2016   Leg hematoma, left, initial encounter 04/20/2016   Bleeding    Ecchymosis    Left hamstring muscle strain    Muscle tear    RBBB 75/17/0017   Chronic systolic dysfunction of left ventricle 07/11/2014   OSA (obstructive sleep apnea) 04/07/2013   Multinodular goiter 01/20/2013   Cervical spondylosis without myelopathy 01/18/2013   Atrial fibrillation with RVR ([place]) 10/27/2012   Left ventricular dysfunction 12/17/2011   Low back pain 09/11/2011   Fatigue 05/22/2011   PVC (premature ventricular contraction)  01/18/2011   Persistent atrial fibrillation ([place]) 01/12/2011   Hyperlipidemia 08/02/2010   Hypertension 08/02/2010   Osteoarthritis 08/02/2010   BPH (benign prostatic hyperplasia) 08/02/2010   GERD (gastroesophageal reflux disease) 08/02/2010   h/o Atrial flutter [facility]) s/p ablation 2012     Sumner Boast, PT 06/20/2021, 3:25 PM  [place]. [place], [place], [address] Phone: [phone]   Fax:  [phone]  Name: Connor [name] MRN: [id number] Date of Birth: 1933-05-13

## 2021-06-21 ENCOUNTER — Ambulatory Visit (INDEPENDENT_AMBULATORY_CARE_PROVIDER_SITE_OTHER): Payer: Medicare PPO

## 2021-06-21 VITALS — BP 136/78 | HR 73 | Temp 98.1°F | Resp 16 | Ht 68.0 in | Wt 229.0 lb

## 2021-06-21 DIAGNOSIS — Z0000 Encounter for general adult medical examination without abnormal findings: Secondary | ICD-10-CM | POA: Diagnosis not present

## 2021-06-21 NOTE — Patient Instructions (Signed)
Connor [name] , Thank you for taking time to come for your Medicare Wellness Visit. I appreciate your ongoing commitment to your health goals. Please review the following plan we discussed and let me know if I can assist you in the future.   Screening recommendations/referrals: Colonoscopy: No longer reuired Recommended yearly ophthalmology/optometry visit for glaucoma screening and checkup Recommended yearly dental visit for hygiene and checkup  Vaccinations: Influenza vaccine: Up to date Pneumococcal vaccine: Up to date Tdap vaccine: Up to date Shingles vaccine: Completed vaccines   Covid-19: Up to date  Advanced directives: Please bring a copy of Living Will and/or Healthcare Power of Attorney for your chart.   Conditions/risks identified: See problem list  Next appointment: Follow up in one year for your annual wellness visit.   Preventive Care 54 Years and Older, Male Preventive care refers to lifestyle choices and visits with your health care provider that can promote health and wellness. What does preventive care include? A yearly physical exam. This is also called an annual well check. Dental exams once or twice a year. Routine eye exams. Ask your health care provider how often you should have your eyes checked. Personal lifestyle choices, including: Daily care of your teeth and gums. Regular physical activity. Eating a healthy diet. Avoiding tobacco and drug use. Limiting alcohol use. Practicing safe sex. Taking low doses of aspirin every day. Taking vitamin and mineral supplements as recommended by your health care provider. What happens during an annual well check? The services and screenings done by your health care provider during your annual well check will depend on your age, overall health, lifestyle risk factors, and family history of disease. Counseling  Your health care provider may ask you questions about your: Alcohol use. Tobacco use. Drug use. Emotional  well-being. Home and relationship well-being. Sexual activity. Eating habits. History of falls. Memory and ability to understand (cognition). Work and work Statistician. Screening  You may have the following tests or measurements: Height, weight, and BMI. Blood pressure. Lipid and cholesterol levels. These may be checked every 5 years, or more frequently if you are over 32 years old. Skin check. Lung cancer screening. You may have this screening every year starting at age 41 if you have a 30-pack-year history of smoking and currently smoke or have quit within the past 15 years. Fecal occult blood test (FOBT) of the stool. You may have this test every year starting at age 72. Flexible sigmoidoscopy or colonoscopy. You may have a sigmoidoscopy every 5 years or a colonoscopy every 10 years starting at age 15. Prostate cancer screening. Recommendations will vary depending on your family history and other risks. Hepatitis C blood test. Hepatitis B blood test. Sexually transmitted disease (STD) testing. Diabetes screening. This is done by checking your blood sugar (glucose) after you have not eaten for a while (fasting). You may have this done every 1-3 years. Abdominal aortic aneurysm (AAA) screening. You may need this if you are a current or former smoker. Osteoporosis. You may be screened starting at age 67 if you are at high risk. Talk with your health care provider about your test results, treatment options, and if necessary, the need for more tests. Vaccines  Your health care provider may recommend certain vaccines, such as: Influenza vaccine. This is recommended every year. Tetanus, diphtheria, and acellular pertussis (Tdap, Td) vaccine. You may need a Td booster every 10 years. Zoster vaccine. You may need this after age 19. Pneumococcal 13-valent conjugate (PCV13) vaccine. One dose  is recommended after age 67. Pneumococcal polysaccharide (PPSV23) vaccine. One dose is recommended after  age 87. Talk to your health care provider about which screenings and vaccines you need and how often you need them. This information is not intended to replace advice given to you by your health care provider. Make sure you discuss any questions you have with your health care provider. Document Released: 05/19/2015 Document Revised: 01/10/2016 Document Reviewed: 02/21/2015 Elsevier Interactive Patient Education  2017 [place] Prevention in the Home Falls can cause injuries. They can happen to people of all ages. There are many things you can do to make your home safe and to help prevent falls. What can I do on the outside of my home? Regularly fix the edges of walkways and driveways and fix any cracks. Remove anything that might make you trip as you walk through a door, such as a raised step or threshold. Trim any bushes or trees on the path to your home. Use bright outdoor lighting. Clear any walking paths of anything that might make someone trip, such as rocks or tools. Regularly check to see if handrails are loose or broken. Make sure that both sides of any steps have handrails. Any raised decks and porches should have guardrails on the edges. Have any leaves, snow, or ice cleared regularly. Use sand or salt on walking paths during winter. Clean up any spills in your garage right away. This includes oil or grease spills. What can I do in the bathroom? Use night lights. Install grab bars by the toilet and in the tub and shower. Do not use towel bars as grab bars. Use non-skid mats or decals in the tub or shower. If you need to sit down in the shower, use a plastic, non-slip stool. Keep the floor dry. Clean up any water that spills on the floor as soon as it happens. Remove soap buildup in the tub or shower regularly. Attach bath mats securely with double-sided non-slip rug tape. Do not have throw rugs and other things on the floor that can make you trip. What can I do in the  bedroom? Use night lights. Make sure that you have a light by your bed that is easy to reach. Do not use any sheets or blankets that are too big for your bed. They should not hang down onto the floor. Have a firm chair that has side arms. You can use this for support while you get dressed. Do not have throw rugs and other things on the floor that can make you trip. What can I do in the kitchen? Clean up any spills right away. Avoid walking on wet floors. Keep items that you use a lot in easy-to-reach places. If you need to reach something above you, use a strong step stool that has a grab bar. Keep electrical cords out of the way. Do not use floor polish or wax that makes floors slippery. If you must use wax, use non-skid floor wax. Do not have throw rugs and other things on the floor that can make you trip. What can I do with my stairs? Do not leave any items on the stairs. Make sure that there are handrails on both sides of the stairs and use them. Fix handrails that are broken or loose. Make sure that handrails are as long as the stairways. Check any carpeting to make sure that it is firmly attached to the stairs. Fix any carpet that is loose or worn. Avoid  having throw rugs at the top or bottom of the stairs. If you do have throw rugs, attach them to the floor with carpet tape. Make sure that you have a light switch at the top of the stairs and the bottom of the stairs. If you do not have them, ask someone to add them for you. What else can I do to help prevent falls? Wear shoes that: Do not have high heels. Have rubber bottoms. Are comfortable and fit you well. Are closed at the toe. Do not wear sandals. If you use a stepladder: Make sure that it is fully opened. Do not climb a closed stepladder. Make sure that both sides of the stepladder are locked into place. Ask someone to hold it for you, if possible. Clearly mark and make sure that you can see: Any grab bars or  handrails. First and last steps. Where the edge of each step is. Use tools that help you move around (mobility aids) if they are needed. These include: Canes. Walkers. Scooters. Crutches. Turn on the lights when you go into a dark area. Replace any light bulbs as soon as they burn out. Set up your furniture so you have a clear path. Avoid moving your furniture around. If any of your floors are uneven, fix them. If there are any pets around you, be aware of where they are. Review your medicines with your doctor. Some medicines can make you feel dizzy. This can increase your chance of falling. Ask your doctor what other things that you can do to help prevent falls. This information is not intended to replace advice given to you by your health care provider. Make sure you discuss any questions you have with your health care provider. Document Released: 02/16/2009 Document Revised: 09/28/2015 Document Reviewed: 05/27/2014 Elsevier Interactive Patient Education  2017 [employer].

## 2021-06-21 NOTE — Progress Notes (Signed)
Subjective:   KEITA VALLEY III is a 88 y.o. male who presents for Medicare Annual/Subsequent preventive examination.    Review of Systems     Cardiac Risk Factors include: advanced age (>39men, >84 women);male gender;hypertension;dyslipidemia;obesity (BMI >30kg/m2)     Objective:    Today's Vitals   06/21/21 0933  BP: 136/78  Pulse: 73  Resp: 16  Temp: 98.1 F (36.7 C)  TempSrc: Oral  SpO2: 95%  Weight: 229 lb (103.9 kg)  Height: 5\' 8"  (1.727 m)   Body mass index is 34.82 kg/m.  Advanced Directives 06/21/2021 03/19/2021 12/15/2020 07/13/2020 06/08/2020 05/17/2020 02/29/2020  Does Patient Have a Medical Advance Directive? Yes Yes Yes Yes Yes Yes Yes  Type of Paramedic of [place];Living will Living will Living will [place];Living will Living will [place];Living will [place];Living will  Does patient want to make changes to medical advance directive? - No - Patient declined - - - - No - Patient declined  Copy of [place] in Chart? No - copy requested - - - - - No - copy requested  Would patient like information on creating a medical advance directive? - - No - Patient declined - - - -  Pre-existing out of facility DNR order (yellow form or pink MOST form) - - - - - - -    Current Medications (verified) Outpatient Encounter Medications as of 06/21/2021  Medication Sig   acetaminophen (TYLENOL) 500 MG tablet Take 1,000-1,500 mg by mouth 2 (two) times daily as needed for moderate pain.    atorvastatin (LIPITOR) 10 MG tablet TAKE 1 TABLET BY MOUTH EVERY DAY   calcium carbonate (TUMS - DOSED IN MG ELEMENTAL CALCIUM) 500 MG chewable tablet Chew 1 tablet by mouth daily.   Carboxymethylcellulose Sodium (THERATEARS OP) Apply 1 drop to eye daily as needed (dry eyes).   Cholecalciferol (VITAMIN D3) 5000 UNITS TABS Take 5,000 Units by mouth See admin instructions. Every 4 days    clindamycin (CLEOCIN T) 1 % external solution Apply 1 application topically daily as needed (for bumps in scalp). Apply to scalp   Cranberry 1000 MG CAPS Take 2,000-3,000 mg by mouth See admin instructions. Take 20000 mg in the morning and 3000 mg in the evening   Docusate Calcium (STOOL SOFTENER PO) Take 2 tablets by mouth at bedtime.    finasteride (PROSCAR) 5 MG tablet Take 5 mg by mouth every morning.    HUMIRA PEN 40 MG/0.4ML PNKT Inject 40 mg as directed every 7 (seven) days. Every Tuesday   lisinopril (ZESTRIL) 40 MG tablet Take 1 tablet (40 mg total) by mouth daily.   metoprolol succinate (TOPROL-XL) 50 MG 24 hr tablet TAKE 1/2 TABLET IN THE MORNING AND TAKE 1 TABLET IN THE EVENING   Multiple Vitamins-Minerals (PRESERVISION AREDS 2) CAPS Take 1 capsule by mouth 2 (two) times a day.   Omega-3 Fatty Acids (OMEGA 3 PO) Take 3 capsules by mouth every evening.   omeprazole (PRILOSEC) 20 MG capsule Take 20 mg by mouth daily.   rivaroxaban (XARELTO) 20 MG TABS tablet Take 1 tablet (20 mg total) by mouth daily with supper.   solifenacin (VESICARE) 10 MG tablet Take 10 mg by mouth at bedtime.    tamsulosin (FLOMAX) 0.4 MG CAPS capsule Take 1 capsule (0.4 mg total) by mouth daily after supper.   vitamin B-12 (CYANOCOBALAMIN) 500 MCG tablet Take 500 mcg by mouth every evening.  No facility-administered encounter medications on file as of 06/21/2021.    Allergies (verified) Penicillins   History: Past Medical History:  Diagnosis Date   Benign localized prostatic hyperplasia with lower urinary tract symptoms (LUTS)    urologist-- dr Diona Fanti   Chronic anticoagulation    Xarelto for afib   Chronic constipation    Chronic cystitis    Gross hematuria    History of DVT of lower extremity 06/08/2010   right femoral dvt dx post op total hip 05-08-2010   History of gastric ulcer    remote   History of kidney stones    one stone. remote hx   Hyperlipidemia    Hypertension    Dx by Dr.  Lance Muss around age  74    NICM (nonischemic cardiomyopathy) ([place])    per echo 06-11-2017,  ef 45-50% with diffuse hypokinesis,  G1DD   OSA on CPAP 2017   compliant with CPAP.  managed by Dr Elsworth Soho.   Osteoarthritis    Other urethral stricture, male, meatal    chronic;  pt self dilates   Paroxysmal atrial fibrillation [facility]) cardiologist-- dr Rayann Heman   first dx 2009/   a. documented by Dr Barrett Shell on office EKG 9/12. b. maintained on Tikosyn and Xarelto. c. s/p DCCV 's.  d.  x3  EP w/ ablation atrial fib last one 12-05-2016   Prediabetes    Premature ventricular contraction    PVC's (premature ventricular contractions)    RA (rheumatoid arthritis) ([place])    rheumatologist--- Dr Page Spiro,  treated with Humira   RBBB (right bundle branch block)    Hx of a fib, ablation Aug 2018   Rheumatoid arthritis ([place])    Vitamin D deficiency    Wears hearing aid in both ears    Past Surgical History:  Procedure Laterality Date   ABLATION OF DYSRHYTHMIC FOCUS  08/29/2015   ATRIAL FIBRILLATION ABLATION  12/05/2016   ATRIAL FIBRILLATION ABLATION N/A 12/05/2016   Procedure: Atrial Fibrillation Ablation;  Surgeon: [name], MD;  Location: [place] [facility];  Service: Cardiovascular;  Laterality: N/A;   ATRIAL FIBRILLATION ABLATION N/A 02/29/2020   Procedure: ATRIAL FIBRILLATION ABLATION;  Surgeon: [name], MD;  Location: [place] [facility];  Service: Cardiovascular;  Laterality: N/A;   ATRIAL FLUTTER ABLATION  09/2010   by [place]ford Bayley Right 1990s   CARDIOVERSION N/A 10/28/2012   Procedure: CARDIOVERSION;  Surgeon: Burnell Blanks, MD;  Location: [place];  Service: Cardiovascular;  Laterality: N/A;   CATARACT EXTRACTION W/ INTRAOCULAR LENS  IMPLANT, BILATERAL  2019   CYSTOSCOPY WITH BIOPSY N/A 09/03/2018   Procedure: CYSTOSCOPY WITH FULGURATION Fran Lowes;  Surgeon: Franchot Gallo, MD;  Location: [facility];  Service: Urology;  Laterality: N/A;  30 MINS   ELECTROPHYSIOLOGIC STUDY N/A  08/29/2015   Procedure: Atrial Fibrillation Ablation;  Surgeon: [name], MD;  Location: [place] [facility];  Service: Cardiovascular;  Laterality: N/A;   KNEE SURGERY Bilateral x3   1960s & 1970s   open   LEFT HEART CATHETERIZATION WITH CORONARY ANGIOGRAM N/A 06/07/2011   Procedure: LEFT HEART CATHETERIZATION WITH CORONARY ANGIOGRAM;  Surgeon: Peter M Martinique, MD;  [facility] CATH LAB;  Service: Cardiovascular;  Laterality: N/A;    Normal coronary arteries,  low normal LVF   TONSILLECTOMY AND ADENOIDECTOMY  age 13   TOTAL HIP ARTHROPLASTY Right 06/04/10    dr Noemi Chapel  @[facility]    TOTAL KNEE ARTHROPLASTY Bilateral right 1995;  left 1997  TRANSURETHRAL RESECTION OF PROSTATE  06-29-2002   dr humphries  @[facility]    TRANSURETHRAL RESECTION OF PROSTATE N/A 07/06/2018   Procedure: TRANSURETHRAL RESECTION OF THE PROSTATE (TURP);  Surgeon: Franchot Gallo, MD;  Location: [facility];  Service: Urology;  Laterality: N/A;  24 MINS   Family History  Problem Relation Age of Onset   Heart attack Father    Lung disease Mother    Arrhythmia Sister    Atrial fibrillation Brother    Diabetes Neg Hx    Social History   Socioeconomic History   Marital status: Married    Spouse name: Joycelyn Schmid   Number of children: Not on file   Years of education: Not on file   Highest education level: Not on file  Occupational History   Occupation: Retired  Tobacco Use   Smoking status: Former    Packs/day: 1.00    Years: 30.00    Pack years: 30.00    Types: Cigarettes    Quit date: 05/06/1978    Years since quitting: 43.1   Smokeless tobacco: Never   Tobacco comments:    Former smoker 03/14/2021  Vaping Use   Vaping Use: Never used  Substance and Sexual Activity   Alcohol use: Yes    Comment: occasionally wine and beer   Drug use: Never   Sexual activity: Not on file  Other Topics Concern   Not on file  Social History Narrative   he patient lives in [place] with his spouse.  Retired.    Social Determinants of Health   Financial Resource Strain: Low Risk    Difficulty of Paying Living Expenses: Not hard at all  Food Insecurity: No Food Insecurity   Worried About Charity fundraiser in the Last Year: Never true   [place] in the Last Year: Never true  Transportation Needs: No Transportation Needs   Lack of Transportation (Medical): No   Lack of Transportation (Non-Medical): No  Physical Activity: Insufficiently Active   Days of Exercise per Week: 2 days   Minutes of Exercise per Session: 50 min  Stress: Not on file  Social Connections: Moderately Isolated   Frequency of Communication with Friends and Family: More than three times a week   Frequency of Social Gatherings with Friends and Family: More than three times a week   Attends Religious Services: Never   Marine scientist or Organizations: No   Attends Archivist Meetings: Never   Marital Status: Married    Tobacco Counseling Counseling given: Not Answered Tobacco comments: Former smoker 03/14/2021   Clinical Intake:  Pre-visit preparation completed: Yes  Pain : No/denies pain     BMI - recorded: 34.82 Nutritional Status: BMI > 30  Obese Nutritional Risks: None Diabetes: No  How often do you need to have someone help you when you read instructions, pamphlets, or other written materials from your doctor or pharmacy?: 1 - Never  Diabetic?No  Interpreter Needed?: No  Information entered by :: Caroleen Hamman LPN   Activities of Daily Living In your present state of health, do you have any difficulty performing the following activities: 06/21/2021  Hearing? Y  Comment bilateral hearing aids  Vision? N  Difficulty concentrating or making decisions? N  Walking or climbing stairs? Y  Comment stairs  Dressing or bathing? N  Doing errands, shopping? N  Preparing Food and eating ? N  Using the Toilet? N  In the past six months, have you accidently leaked  urine? N  Do  you have problems with loss of bowel control? N  Managing your Medications? N  Managing your Finances? N  Housekeeping or managing your Housekeeping? N  Some recent data might be hidden    Patient Care Team: Copland, Gay Filler, MD as PCP - General (Family Medicine) [name], MD as PCP - Cardiology (Cardiology) Elsie Saas, MD (Orthopedic Surgery)  Indicate any recent Medical Services you may have received from other than [facility] providers in the past year (date may be approximate).     Assessment:   This is a routine wellness examination for [place].  Hearing/Vision screen Hearing Screening - Comments:: Bilateral hearing aids Vision Screening - Comments:: Last eye exam-Dr. Hecker-2023  Dietary issues and exercise activities discussed: Current Exercise Habits: Structured exercise class, Type of exercise: strength training/weights;walking, Time (Minutes): 45, Frequency (Times/Week): 2, Weekly Exercise (Minutes/Week): 90, Intensity: Mild, Exercise limited by: orthopedic condition(s) (physical therapy)   Goals Addressed             This Visit's Progress    Increase physical activity   Not on track      Depression Screen PHQ 2/9 Scores 06/21/2021 12/20/2020 09/07/2020 06/08/2020 05/20/2019 11/20/2017 11/08/2016  PHQ - 2 Score 0 0 0 0 0 0 0    Fall Risk Fall Risk  06/21/2021 12/20/2020 09/07/2020 06/08/2020 05/20/2019  Falls in the past year? 0 1 0 0 0  Number falls in past yr: 0 0 0 0 0  Injury with Fall? 0 1 0 0 0  Risk for fall due to : - - - - -  Follow up Falls prevention discussed Falls evaluation completed - Falls prevention discussed Education provided;Falls prevention discussed    FALL RISK PREVENTION PERTAINING TO THE HOME:  Any stairs in or around the home? Yes  If so, are there any without handrails? No  Home free of loose throw rugs in walkways, pet beds, electrical cords, etc? Yes  Adequate lighting in your home to reduce risk of falls? Yes   ASSISTIVE DEVICES  UTILIZED TO PREVENT FALLS:  Life alert? No  Use of a cane, walker or w/c? No  Grab bars in the bathroom? Yes  Shower chair or bench in shower? No  Elevated toilet seat or a handicapped toilet? No   TIMED UP AND GO:  Was the test performed? Yes .  Length of time to ambulate 10 feet: 11 sec.   Gait steady and fast without use of assistive device  Cognitive Function:Normal cognitive status assessed by direct observation by this Nurse Health Advisor. No abnormalities found.       6CIT Screen 05/20/2019  What Year? 0 points  What month? 0 points  What time? 0 points  Count back from 20 0 points  Months in reverse 0 points  Repeat phrase 0 points  Total Score 0    Immunizations Immunization History  Administered Date(s) Administered   Fluad Quad(high Dose 65+) 01/13/2021   Influenza Split 02/03/2013   Influenza, High Dose Seasonal PF 02/03/2017, 01/31/2020   Influenza,inj,Quad PF,6+ Mos 02/28/2015   Influenza-Unspecified 02/24/2014, 01/09/2016, 02/05/2017, 01/17/2018   PFIZER(Purple Top)SARS-COV-2 Vaccination 05/19/2019, 06/08/2019, 01/31/2020, 09/07/2020   Pfizer Covid-19 Vaccine Bivalent Booster 61yrs & up 01/13/2021   Pneumococcal Conjugate-13 09/16/2013   Pneumococcal Polysaccharide-23 09/20/2015   Tdap 11/08/2016   Zoster Recombinat (Shingrix) 01/19/2019, 04/21/2019   Zoster, Live 06/16/2006    TDAP status: Up to date  Flu Vaccine status: Up to date  Pneumococcal vaccine status: Up  to date  Covid-19 vaccine status: Completed vaccines  Qualifies for Shingles Vaccine? No   Zostavax completed Yes   Shingrix Completed?: Yes  Screening Tests Health Maintenance  Topic Date Due   TETANUS/TDAP  11/09/2026   Pneumonia Vaccine 76+ Years old  Completed   INFLUENZA VACCINE  Completed   COVID-19 Vaccine  Completed   Zoster Vaccines- Shingrix  Completed   HPV VACCINES  Aged Out    Health Maintenance  There are no preventive care reminders to display for this  patient.  Colorectal cancer screening: No longer required.   Lung Cancer Screening: (Low Dose CT Chest recommended if Age 4-80 years, 30 pack-year currently smoking OR have quit w/in 15years.) does not qualify.     Additional Screening:  Hepatitis C Screening: does not qualify  Vision Screening: Recommended annual ophthalmology exams for early detection of glaucoma and other disorders of the eye. Is the patient up to date with their annual eye exam?  Yes  Who is the provider or what is the name of the office in which the patient attends annual eye exams? Dr. Herbert Deaner  Dental Screening: Recommended annual dental exams for proper oral hygiene  Community Resource Referral / Chronic Care Management: CRR required this visit?  No   CCM required this visit?  No      Plan:     I have personally reviewed and noted the following in the patient's chart:   Medical and social history Use of alcohol, tobacco or illicit drugs  Current medications and supplements including opioid prescriptions. Patient is not currently taking opioid prescriptions. Functional ability and status Nutritional status Physical activity Advanced directives List of other physicians Hospitalizations, surgeries, and ER visits in previous 12 months Vitals Screenings to include cognitive, depression, and falls Referrals and appointments  In addition, I have reviewed and discussed with patient certain preventive protocols, quality metrics, and best practice recommendations. A written personalized care plan for preventive services as well as general preventive health recommendations were provided to patient.    Patient would like to access avs on my-chart.  Marta Antu, LPN   [id number]  Nurse Health Advisor  Nurse Notes: None

## 2021-06-25 ENCOUNTER — Encounter (INDEPENDENT_AMBULATORY_CARE_PROVIDER_SITE_OTHER): Payer: Medicare PPO | Admitting: Ophthalmology

## 2021-06-26 ENCOUNTER — Ambulatory Visit: Payer: Medicare PPO | Admitting: Physical Therapy

## 2021-06-26 ENCOUNTER — Other Ambulatory Visit: Payer: Self-pay

## 2021-06-26 ENCOUNTER — Encounter: Payer: Self-pay | Admitting: Physical Therapy

## 2021-06-26 DIAGNOSIS — R252 Cramp and spasm: Secondary | ICD-10-CM | POA: Diagnosis not present

## 2021-06-26 DIAGNOSIS — H353132 Nonexudative age-related macular degeneration, bilateral, intermediate dry stage: Secondary | ICD-10-CM | POA: Diagnosis not present

## 2021-06-26 DIAGNOSIS — M6281 Muscle weakness (generalized): Secondary | ICD-10-CM | POA: Diagnosis not present

## 2021-06-26 DIAGNOSIS — M542 Cervicalgia: Secondary | ICD-10-CM

## 2021-06-26 DIAGNOSIS — R2681 Unsteadiness on feet: Secondary | ICD-10-CM

## 2021-06-26 DIAGNOSIS — R262 Difficulty in walking, not elsewhere classified: Secondary | ICD-10-CM

## 2021-06-26 NOTE — Therapy (Signed)
[place] [place], [place], [address] Phone: [phone]   Fax:  [phone]  Physical Therapy Treatment  Patient Details  Name: Connor [name] MRN: [id number] Date of Birth: August 15, 1933 Referring Provider (PT): Copland   Encounter Date: 06/26/2021   PT End of Session - 06/26/21 1541     Visit Number 19    Date for PT Re-Evaluation 07/17/21    Authorization Type [employer]    Authorization Time Period 8/12    PT Start Time 1440    PT Stop Time 1520    PT Time Calculation (min) 40 min    Activity Tolerance Patient tolerated treatment well    Behavior During Therapy [facility] for tasks assessed/performed             Past Medical History:  Diagnosis Date   Benign localized prostatic hyperplasia with lower urinary tract symptoms (LUTS)    urologist-- dr Diona Fanti   Chronic anticoagulation    Xarelto for afib   Chronic constipation    Chronic cystitis    Gross hematuria    History of DVT of lower extremity 06/08/2010   right femoral dvt dx post op total hip 05-08-2010   History of gastric ulcer    remote   History of kidney stones    one stone. remote hx   Hyperlipidemia    Hypertension    Dx by Dr. Lance Muss around age  65    NICM (nonischemic cardiomyopathy) ([place])    per echo 06-11-2017,  ef 45-50% with diffuse hypokinesis,  G1DD   OSA on CPAP 2017   compliant with CPAP.  managed by Dr Elsworth Soho.   Osteoarthritis    Other urethral stricture, male, meatal    chronic;  pt self dilates   Paroxysmal atrial fibrillation [facility]) cardiologist-- dr Rayann Heman   first dx 2009/   a. documented by Dr Barrett Shell on office EKG 9/12. b. maintained on Tikosyn and Xarelto. c. s/p DCCV 's.  d.  x3  EP w/ ablation atrial fib last one 12-05-2016   Prediabetes    Premature ventricular contraction    PVC's (premature ventricular contractions)    RA (rheumatoid arthritis) ([place])    rheumatologist--- Dr Page Spiro,  treated with Humira    RBBB (right bundle branch block)    Hx of a fib, ablation Aug 2018   Rheumatoid arthritis ([place])    Vitamin D deficiency    Wears hearing aid in both ears     Past Surgical History:  Procedure Laterality Date   ABLATION OF DYSRHYTHMIC FOCUS  08/29/2015   ATRIAL FIBRILLATION ABLATION  12/05/2016   ATRIAL FIBRILLATION ABLATION N/A 12/05/2016   Procedure: Atrial Fibrillation Ablation;  Surgeon: [name], MD;  Location: [place] [facility];  Service: Cardiovascular;  Laterality: N/A;   ATRIAL FIBRILLATION ABLATION N/A 02/29/2020   Procedure: ATRIAL FIBRILLATION ABLATION;  Surgeon: [name], MD;  Location: [place] [facility];  Service: Cardiovascular;  Laterality: N/A;   ATRIAL FLUTTER ABLATION  09/2010   by Glenford Bayley Right 1990s   CARDIOVERSION N/A 10/28/2012   Procedure: CARDIOVERSION;  Surgeon: Burnell Blanks, MD;  Location: [place];  Service: Cardiovascular;  Laterality: N/A;   CATARACT EXTRACTION W/ INTRAOCULAR LENS  IMPLANT, BILATERAL  2019   CYSTOSCOPY WITH BIOPSY N/A 09/03/2018   Procedure: CYSTOSCOPY WITH FULGURATION Fran Lowes;  Surgeon: Franchot Gallo, MD;  Location: [facility];  Service: Urology;  Laterality: N/A;  30 MINS  ELECTROPHYSIOLOGIC STUDY N/A 08/29/2015   Procedure: Atrial Fibrillation Ablation;  Surgeon: [name], MD;  Location: [place] [facility];  Service: Cardiovascular;  Laterality: N/A;   KNEE SURGERY Bilateral x3   1960s & 1970s   open   LEFT HEART CATHETERIZATION WITH CORONARY ANGIOGRAM N/A 06/07/2011   Procedure: LEFT HEART CATHETERIZATION WITH CORONARY ANGIOGRAM;  Surgeon: Peter M Martinique, MD;  Location: [facility] C[facility];  Service: Cardiovascular;  Laterality: N/A;    Normal coronary arteries,  low normal LVF   TONSILLECTOMY AND ADENOIDECTOMY  age 35   TOTAL HIP ARTHROPLASTY Right 06/04/10    dr Noemi Chapel  _0    TOTAL KNEE ARTHROPLASTY Bilateral right 1995;  left [place]  06-29-2002   dr humphries  _1     TRANSURETHRAL RESECTION OF PROSTATE N/A 07/06/2018   Procedure: TRANSURETHRAL RESECTION OF THE PROSTATE (TURP);  Surgeon: Franchot Gallo, MD;  Location: [facility];  Service: Urology;  Laterality: N/A;  45 MINS    There were no vitals filed for this visit.   Subjective Assessment - 06/26/21 1447     Subjective Doing pretty good, get injection tomorrow.    Currently in Pain? No/denies                               [facility] Adult PT Treatment/Exercise - 06/26/21 0001       High Level Balance   High Level Balance Comments on airex ball toss      Knee/Hip Exercises: Stretches   Passive Hamstring Stretch Left;4 reps;20 seconds    Piriformis Stretch Left;4 reps;20 seconds    Gastroc Stretch Both;3 reps;20 seconds    Other Knee/Hip Stretches passive knee to chest      Knee/Hip Exercises: Aerobic   Nustep Level 5 x 6 minutes      Knee/Hip Exercises: Machines for Strengthening   Cybex Knee Extension 5# 3x10    Cybex Knee Flexion 35# 2x10    Other Machine seated rows and lats 35# 2x10, triceps 25#, biceps 10# all with cues to engage the core, 5# straight arm pulls for core                       PT Short Term Goals - 04/17/21 1143       PT SHORT TERM GOAL #1   Title Pt will be I and compliant with initial HEP.    Status Partially Met               PT Long Term Goals - 06/20/21 1524       PT LONG TERM GOAL #1   Title Pt will be independent with long term HEP for improved posture.    Status Partially Met      PT LONG TERM GOAL #2   Title Pt will walk for 30 minutes with no report of lightheadedness, as needed for daily exercise and errands.    Status Partially Met      PT LONG TERM GOAL #3   Title decrease TUG to 12 seconds    Status Partially Met                   Plan - 06/26/21 1541     Clinical Impression Statement Patient doing well, less pain and less issues, he will have an injection tomorrow in the  back/hip, I asked him to ask about the  PT and to not over do it and assure to follow instructions    PT Next Visit Plan since he is feeling better I will not change treatment until after his injection    Consulted and Agree with Plan of Care Patient             Patient will benefit from skilled therapeutic intervention in order to improve the following deficits and impairments:  Decreased coordination, Decreased range of motion, Difficulty walking, Decreased endurance, Cardiopulmonary status limiting activity, Decreased activity tolerance, Decreased strength, Decreased mobility, Decreased balance  Visit Diagnosis: Muscle weakness (generalized)  Difficulty in walking, not elsewhere classified  Unsteadiness on feet  Cervicalgia     Problem List Patient Active Problem List   Diagnosis Date Noted   Secondary hypercoagulable state ([place]) 03/28/2020   Malignant neoplasm of prostate ([place]) 09/11/2018   Enlarged prostate with urinary obstruction 07/06/2018   Gastrocnemius strain, right, initial encounter 12/10/2017   Baker's cyst of knee, right 12/10/2017   Obesity 03/24/2017   Paroxysmal atrial fibrillation ([place]) 12/05/2016   Near syncope 04/20/2016   Leg hematoma, left, initial encounter 04/20/2016   Bleeding    Ecchymosis    Left hamstring muscle strain    Muscle tear    RBBB 60/73/7106   Chronic systolic dysfunction of left ventricle 07/11/2014   OSA (obstructive sleep apnea) 04/07/2013   Multinodular goiter 01/20/2013   Cervical spondylosis without myelopathy 01/18/2013   Atrial fibrillation with RVR ([place]) 10/27/2012   Left ventricular dysfunction 12/17/2011   Low back pain 09/11/2011   Fatigue 05/22/2011   PVC (premature ventricular contraction) 01/18/2011   Persistent atrial fibrillation ([place]) 01/12/2011   Hyperlipidemia 08/02/2010   Hypertension 08/02/2010   Osteoarthritis 08/02/2010   BPH (benign prostatic hyperplasia) 08/02/2010   GERD (gastroesophageal reflux  disease) 08/02/2010   h/o Atrial flutter [facility]) s/p ablation 2012     Sumner Boast, PT 06/26/2021, 3:43 PM  [place]. [place], [place], [address] Phone: [phone]   Fax:  [phone]  Name: Connor [name] MRN: [id number] Date of Birth: [birth year]

## 2021-06-27 DIAGNOSIS — M8448XD Pathological fracture, other site, subsequent encounter for fracture with routine healing: Secondary | ICD-10-CM | POA: Diagnosis not present

## 2021-06-27 DIAGNOSIS — M25552 Pain in left hip: Secondary | ICD-10-CM | POA: Diagnosis not present

## 2021-06-28 ENCOUNTER — Other Ambulatory Visit: Payer: Self-pay

## 2021-06-28 ENCOUNTER — Ambulatory Visit: Payer: Medicare PPO | Admitting: Physical Therapy

## 2021-06-28 ENCOUNTER — Encounter: Payer: Self-pay | Admitting: Physical Therapy

## 2021-06-28 DIAGNOSIS — M542 Cervicalgia: Secondary | ICD-10-CM | POA: Diagnosis not present

## 2021-06-28 DIAGNOSIS — R2681 Unsteadiness on feet: Secondary | ICD-10-CM | POA: Diagnosis not present

## 2021-06-28 DIAGNOSIS — M6281 Muscle weakness (generalized): Secondary | ICD-10-CM | POA: Diagnosis not present

## 2021-06-28 DIAGNOSIS — R252 Cramp and spasm: Secondary | ICD-10-CM | POA: Diagnosis not present

## 2021-06-28 DIAGNOSIS — R262 Difficulty in walking, not elsewhere classified: Secondary | ICD-10-CM | POA: Diagnosis not present

## 2021-06-28 NOTE — Therapy (Signed)
[place]. [place], [place], [address] Phone: [phone]   Fax:  [phone] Progress Note Reporting Period 05/17/21 to 06/28/21  See note below for Objective Data and Assessment of Progress/Goals.     Physical Therapy Treatment  Patient Details  Name: Connor [name] MRN: [id number] Date of Birth: 06-01-1933 Referring Provider (PT): Copland   Encounter Date: 06/28/2021   PT End of Session - 06/28/21 1605     Visit Number 20    Date for PT Re-Evaluation 07/17/21    Authorization Type [employer]    Authorization Time Period 9/12    PT Start Time 1440    PT Stop Time 1525    PT Time Calculation (min) 45 min    Activity Tolerance Patient tolerated treatment well    Behavior During Therapy [facility] for tasks assessed/performed             Past Medical History:  Diagnosis Date   Benign localized prostatic hyperplasia with lower urinary tract symptoms (LUTS)    urologist-- dr Diona Fanti   Chronic anticoagulation    Xarelto for afib   Chronic constipation    Chronic cystitis    Gross hematuria    History of DVT of lower extremity 06/08/2010   right femoral dvt dx post op total hip 05-08-2010   History of gastric ulcer    remote   History of kidney stones    one stone. remote hx   Hyperlipidemia    Hypertension    Dx by Dr. Lance Muss around age  78    NICM (nonischemic cardiomyopathy) ([place])    per echo 06-11-2017,  ef 45-50% with diffuse hypokinesis,  G1DD   OSA on CPAP 2017   compliant with CPAP.  managed by Dr Elsworth Soho.   Osteoarthritis    Other urethral stricture, male, meatal    chronic;  pt self dilates   Paroxysmal atrial fibrillation [facility]) cardiologist-- dr Rayann Heman   first dx 2009/   a. documented by Dr Barrett Shell on office EKG 9/12. b. maintained on Tikosyn and Xarelto. c. s/p DCCV 's.  d.  x3  EP w/ ablation atrial fib last one 12-05-2016   Prediabetes    Premature ventricular contraction    PVC's  (premature ventricular contractions)    RA (rheumatoid arthritis) ([place])    rheumatologist--- Dr Page Spiro,  treated with Humira   RBBB (right bundle branch block)    Hx of a fib, ablation Aug 2018   Rheumatoid arthritis ([place])    Vitamin D deficiency    Wears hearing aid in both ears     Past Surgical History:  Procedure Laterality Date   ABLATION OF DYSRHYTHMIC FOCUS  08/29/2015   ATRIAL FIBRILLATION ABLATION  12/05/2016   ATRIAL FIBRILLATION ABLATION N/A 12/05/2016   Procedure: Atrial Fibrillation Ablation;  Surgeon: [name], MD;  Location: [place] [facility];  Service: Cardiovascular;  Laterality: N/A;   ATRIAL FIBRILLATION ABLATION N/A 02/29/2020   Procedure: ATRIAL FIBRILLATION ABLATION;  Surgeon: [name], MD;  Location: [place] [facility];  Service: Cardiovascular;  Laterality: N/A;   ATRIAL FLUTTER ABLATION  09/2010   by Glenford Bayley Right 1990s   CARDIOVERSION N/A 10/28/2012   Procedure: CARDIOVERSION;  Surgeon: Burnell Blanks, MD;  Location: [place];  Service: Cardiovascular;  Laterality: N/A;   CATARACT EXTRACTION W/ INTRAOCULAR LENS  IMPLANT, BILATERAL  2019   CYSTOSCOPY WITH BIOPSY N/A 09/03/2018   Procedure: CYSTOSCOPY WITH  FULGURATION [id number];  Surgeon: Franchot Gallo, MD;  [facility];  Service: Urology;  Laterality: N/A;  30 MINS   ELECTROPHYSIOLOGIC STUDY N/A 08/29/2015   Procedure: Atrial Fibrillation Ablation;  Surgeon: [name], MD;  Location: [place] [facility];  Service: Cardiovascular;  Laterality: N/A;   KNEE SURGERY Bilateral x3   1960s & 1970s   open   LEFT HEART CATHETERIZATION WITH CORONARY ANGIOGRAM N/A 06/07/2011   Procedure: LEFT HEART CATHETERIZATION WITH CORONARY ANGIOGRAM;  Surgeon: Peter M Martinique, MD;  Location: [facility] C[facility] LAB;  Service: Cardiovascular;  Laterality: N/A;    Normal coronary arteries,  low normal LVF   TONSILLECTOMY AND ADENOIDECTOMY  age 54   TOTAL HIP ARTHROPLASTY Right 06/04/10    dr Noemi Chapel  '@[facility]'    TOTAL  KNEE ARTHROPLASTY Bilateral right 1995;  left [place]  06-29-2002   dr humphries  '@[facility]'    TRANSURETHRAL RESECTION OF PROSTATE N/A 07/06/2018   Procedure: TRANSURETHRAL RESECTION OF THE PROSTATE (TURP);  Surgeon: Franchot Gallo, MD;  Location: [facility];  Service: Urology;  Laterality: N/A;  45 MINS    There were no vitals filed for this visit.   Subjective Assessment - 06/28/21 1446     Subjective Saw MD, probable sacral alar insufficeiency fracture, left hip OA and a lot of DDD issues in the spine.MD suggested continue PT    Currently in Pain? No/denies                               [facility] Adult PT Treatment/Exercise - 06/28/21 0001       Knee/Hip Exercises: Stretches   Passive Hamstring Stretch Left;4 reps;20 seconds    Gastroc Stretch Both;3 reps;20 seconds    Other Knee/Hip Stretches passive knee to chest      Knee/Hip Exercises: Aerobic   Recumbent Bike level 2 x 4 minutes      Knee/Hip Exercises: Machines for Strengthening   Cybex Knee Extension 5# 3x10    Cybex Knee Flexion 35# 3x10    Other Machine seated rows and lats 35# 2x10, triceps 25#, biceps 10# all with cues to engage the core, 5# straight arm pulls for core      Knee/Hip Exercises: Supine   Bridges with [employer] 15 reps    Bridges with [employer] 15 reps    Other Supine Knee/Hip Exercises feet on ball K2C, trunk rotation, small bridges and isometric abs                       PT Short Term Goals - 04/17/21 1143       PT SHORT TERM GOAL #1   Title Pt will be I and compliant with initial HEP.    Status Partially Met               PT Long Term Goals - 06/28/21 1607       PT LONG TERM GOAL #1   Title Pt will be independent with long term HEP for improved posture.    Status Partially Met      PT LONG TERM GOAL #2   Title Pt will walk for 30 minutes with no report of lightheadedness, as needed for daily exercise  and errands.    Status Partially Met      PT LONG TERM GOAL #3   Title decrease TUG to 12 seconds    Status  Partially Met                   Plan - 06/28/21 1605     Clinical Impression Statement Saw MD, decided against injection since he is doing better, the MRI did show a sacral ala insuffenciency fracture.  Sever OA left hip and severe DDD of the lumbar spine.  MD felt that since PT was helping that we should continue, the patient is going to start backing off of the prednisone    PT Next Visit Plan continue current treatment    Consulted and Agree with Plan of Care Patient             Patient will benefit from skilled therapeutic intervention in order to improve the following deficits and impairments:  Decreased coordination, Decreased range of motion, Difficulty walking, Decreased endurance, Cardiopulmonary status limiting activity, Decreased activity tolerance, Decreased strength, Decreased mobility, Decreased balance  Visit Diagnosis: Muscle weakness (generalized)  Difficulty in walking, not elsewhere classified  Unsteadiness on feet  Cervicalgia     Problem List Patient Active Problem List   Diagnosis Date Noted   Secondary hypercoagulable state ([place]) 03/28/2020   Malignant neoplasm of prostate ([place]) 09/11/2018   Enlarged prostate with urinary obstruction 07/06/2018   Gastrocnemius strain, right, initial encounter 12/10/2017   Baker's cyst of knee, right 12/10/2017   Obesity 03/24/2017   Paroxysmal atrial fibrillation ([place]) 12/05/2016   Near syncope 04/20/2016   Leg hematoma, left, initial encounter 04/20/2016   Bleeding    Ecchymosis    Left hamstring muscle strain    Muscle tear    RBBB 92/05/69   Chronic systolic dysfunction of left ventricle 07/11/2014   OSA (obstructive sleep apnea) 04/07/2013   Multinodular goiter 01/20/2013   Cervical spondylosis without myelopathy 01/18/2013   Atrial fibrillation with RVR ([place]) 10/27/2012   Left  ventricular dysfunction 12/17/2011   Low back pain 09/11/2011   Fatigue 05/22/2011   PVC (premature ventricular contraction) 01/18/2011   Persistent atrial fibrillation ([place]) 01/12/2011   Hyperlipidemia 08/02/2010   Hypertension 08/02/2010   Osteoarthritis 08/02/2010   BPH (benign prostatic hyperplasia) 08/02/2010   GERD (gastroesophageal reflux disease) 08/02/2010   h/o Atrial flutter [facility]) s/p ablation 2012     Sumner Boast, PT 06/28/2021, 4:07 PM  [place]. [place], [place], [address] Phone: [phone]   Fax:  [phone]  Name: Connor [name] MRN: [id number] Date of Birth: August 05, 1933

## 2021-06-29 NOTE — Progress Notes (Signed)
HPI: Follow-up atrial fibrillation, hypertension, hyperlipidemia.  Previously followed by Dr. Rayann Heman.  Patient had a normal cardiac catheterization in 2013.  Patient had atrial fibrillation ablation in 2017 and 18.  He again had atrial fibrillation ablation in October 2021 with Dr. Rayann Heman.  Last echocardiogram October 2021 showed normal LV function, mild left ventricular enlargement, grade 1 diastolic dysfunction, mild right ventricular enlargement, mild biatrial enlargement.  Patient denies dyspnea on exertion, orthopnea, PND, pedal edema, chest pain or syncope.  No bleeding.  Current Outpatient Medications  Medication Sig Dispense Refill   acetaminophen (TYLENOL) 500 MG tablet Take 1,000-1,500 mg by mouth 2 (two) times daily as needed for moderate pain.      atorvastatin (LIPITOR) 10 MG tablet TAKE 1 TABLET BY MOUTH EVERY DAY 90 tablet 3   calcium carbonate (TUMS - DOSED IN MG ELEMENTAL CALCIUM) 500 MG chewable tablet Chew 1 tablet by mouth daily.     Carboxymethylcellulose Sodium (THERATEARS OP) Apply 1 drop to eye daily as needed (dry eyes).     Cholecalciferol (VITAMIN D3) 5000 UNITS TABS Take 5,000 Units by mouth See admin instructions. Every 4 days     clindamycin (CLEOCIN T) 1 % external solution Apply 1 application topically daily as needed (for bumps in scalp). Apply to scalp  2   Cranberry 1000 MG CAPS Take 2,000-3,000 mg by mouth See admin instructions. Take 20000 mg in the morning and 3000 mg in the evening     Docusate Calcium (STOOL SOFTENER PO) Take 2 tablets by mouth at bedtime.      finasteride (PROSCAR) 5 MG tablet Take 5 mg by mouth every morning.   3   HUMIRA PEN 40 MG/0.4ML PNKT Inject 40 mg as directed every 7 (seven) days. Every Tuesday  6   lisinopril (ZESTRIL) 40 MG tablet TAKE 1 TABLET BY MOUTH EVERY DAY 90 tablet 3   metoprolol succinate (TOPROL-XL) 50 MG 24 hr tablet TAKE 1/2 TABLET IN THE MORNING AND TAKE 1 TABLET IN THE EVENING 135 tablet 3   Multiple  Vitamins-Minerals (PRESERVISION AREDS 2) CAPS Take 1 capsule by mouth 2 (two) times a day.     Omega-3 Fatty Acids (OMEGA 3 PO) Take 3 capsules by mouth every evening.     omeprazole (PRILOSEC) 20 MG capsule Take 20 mg by mouth daily.     rivaroxaban (XARELTO) 20 MG TABS tablet Take 1 tablet (20 mg total) by mouth daily with supper. 90 tablet 1   solifenacin (VESICARE) 10 MG tablet Take 10 mg by mouth at bedtime.      tamsulosin (FLOMAX) 0.4 MG CAPS capsule Take 1 capsule (0.4 mg total) by mouth daily after supper. 30 capsule 5   vitamin B-12 (CYANOCOBALAMIN) 500 MCG tablet Take 500 mcg by mouth every evening.      No current facility-administered medications for this visit.     Past Medical History:  Diagnosis Date   Benign localized prostatic hyperplasia with lower urinary tract symptoms (LUTS)    urologist-- dr Diona Fanti   Chronic anticoagulation    Xarelto for afib   Chronic constipation    Chronic cystitis    Gross hematuria    History of DVT of lower extremity 06/08/2010   right femoral dvt dx post op total hip 05-08-2010   History of gastric ulcer    remote   History of kidney stones    one stone. remote hx   Hyperlipidemia    Hypertension    Dx by  Dr. Lance Muss around age  35    NICM (nonischemic cardiomyopathy) ([place])    per echo 06-11-2017,  ef 45-50% with diffuse hypokinesis,  G1DD   OSA on CPAP 2017   compliant with CPAP.  managed by Dr Elsworth Soho.   Osteoarthritis    Other urethral stricture, male, meatal    chronic;  pt self dilates   Paroxysmal atrial fibrillation [facility]) cardiologist-- dr Rayann Heman   first dx 2009/   a. documented by Dr Barrett Shell on office EKG 9/12. b. maintained on Tikosyn and Xarelto. c. s/p DCCV 's.  d.  x3  EP w/ ablation atrial fib last one 12-05-2016   Prediabetes    Premature ventricular contraction    PVC's (premature ventricular contractions)    RA (rheumatoid arthritis) ([place])    rheumatologist--- Dr Page Spiro,  treated with Humira   RBBB (right  bundle branch block)    Hx of a fib, ablation Aug 2018   Rheumatoid arthritis ([place])    Vitamin D deficiency    Wears hearing aid in both ears     Past Surgical History:  Procedure Laterality Date   ABLATION OF DYSRHYTHMIC FOCUS  08/29/2015   ATRIAL FIBRILLATION ABLATION  12/05/2016   ATRIAL FIBRILLATION ABLATION N/A 12/05/2016   Procedure: Atrial Fibrillation Ablation;  Surgeon: [name], MD;  Location: [place] [facility];  Service: Cardiovascular;  Laterality: N/A;   ATRIAL FIBRILLATION ABLATION N/A 02/29/2020   Procedure: ATRIAL FIBRILLATION ABLATION;  Surgeon: [name], MD;  Location: [place] [facility];  Service: Cardiovascular;  Laterality: N/A;   ATRIAL FLUTTER ABLATION  09/2010   by Glenford Bayley Right 1990s   CARDIOVERSION N/A 10/28/2012   Procedure: CARDIOVERSION;  Surgeon: Burnell Blanks, MD;  Location: [place];  Service: Cardiovascular;  Laterality: N/A;   CATARACT EXTRACTION W/ INTRAOCULAR LENS  IMPLANT, BILATERAL  2019   CYSTOSCOPY WITH BIOPSY N/A 09/03/2018   Procedure: CYSTOSCOPY WITH FULGURATION Fran Lowes;  Surgeon: Franchot Gallo, MD;  Location: [facility];  Service: Urology;  Laterality: N/A;  30 MINS   ELECTROPHYSIOLOGIC STUDY N/A 08/29/2015   Procedure: Atrial Fibrillation Ablation;  Surgeon: [name], MD;  Location: [place] [facility];  Service: Cardiovascular;  Laterality: N/A;   KNEE SURGERY Bilateral x3   1960s & 1970s   open   LEFT HEART CATHETERIZATION WITH CORONARY ANGIOGRAM N/A 06/07/2011   Procedure: LEFT HEART CATHETERIZATION WITH CORONARY ANGIOGRAM;  Surgeon: Peter M Martinique, MD;  Location: [facility];  Service: Cardiovascular;  Laterality: N/A;    Normal coronary arteries,  low normal LVF   TONSILLECTOMY AND ADENOIDECTOMY  age 21   TOTAL HIP ARTHROPLASTY Right 06/04/10    dr Noemi Chapel  @[facility]    TOTAL KNEE ARTHROPLASTY Bilateral right 1995;  left 1997   TRANSURETHRAL RESECTION OF PROSTATE  06-29-2002   dr humphries  @[facility]    TRANSURETHRAL  RESECTION OF PROSTATE N/A 07/06/2018   Procedure: TRANSURETHRAL RESECTION OF THE PROSTATE (TURP);  Surgeon: Franchot Gallo, MD;  [facility];  Service: Urology;  Laterality: N/A;  26 MINS    Social History   Socioeconomic History   Marital status: Married    Spouse name: Joycelyn Schmid   Number of children: Not on file   Years of education: Not on file   Highest education level: Not on file  Occupational History   Occupation: Retired  Tobacco Use   Smoking status: Former    Packs/day: 1.00    Years: 30.00  Pack years: 30.00    Types: Cigarettes    Quit date: 05/06/1978    Years since quitting: 43.1   Smokeless tobacco: Never   Tobacco comments:    Former smoker 03/14/2021  Vaping Use   Vaping Use: Never used  Substance and Sexual Activity   Alcohol use: Yes    Comment: occasionally wine and beer   Drug use: Never   Sexual activity: Not on file  Other Topics Concern   Not on file  Social History Narrative   he patient lives in [place] with his spouse.  Retired.   Social Determinants of Health   Financial Resource Strain: Low Risk    Difficulty of Paying Living Expenses: Not hard at all  Food Insecurity: No Food Insecurity   Worried About Charity fundraiser in the Last Year: Never true   [place] in the Last Year: Never true  Transportation Needs: No Transportation Needs   Lack of Transportation (Medical): No   Lack of Transportation (Non-Medical): No  Physical Activity: Insufficiently Active   Days of Exercise per Week: 2 days   Minutes of Exercise per Session: 50 min  Stress: Not on file  Social Connections: Moderately Isolated   Frequency of Communication with Friends and Family: More than three times a week   Frequency of Social Gatherings with Friends and Family: More than three times a week   Attends Religious Services: Never   Marine scientist or Organizations: No   Attends Music therapist: Never    Marital Status: Married  Human resources officer Violence: Not At Risk   Fear of Current or Ex-Partner: No   Emotionally Abused: No   Physically Abused: No   Sexually Abused: No    Family History  Problem Relation Age of Onset   Heart attack Father    Lung disease Mother    Arrhythmia Sister    Atrial fibrillation Brother    Diabetes Neg Hx     ROS: no fevers or chills, productive cough, hemoptysis, dysphasia, odynophagia, melena, hematochezia, dysuria, hematuria, rash, seizure activity, orthopnea, PND, pedal edema, claudication. Remaining systems are negative.  Physical Exam: Well-developed well-nourished in no acute distress.  Skin is warm and dry.  HEENT is normal.  Neck is supple.  Chest is clear to auscultation with normal expansion.  Cardiovascular exam is regular rate and rhythm.  Abdominal exam nontender or distended. No masses palpated. Extremities show no edema. neuro grossly intact  A/P  1 paroxysmal atrial fibrillation-patient is status post ablation x3 most recently in 2021.  He remains in sinus rhythm.  We will continue Toprol and Xarelto.  2 hypertension-patient's blood pressure is controlled.  Continue present medical regimen.  3 obstructive sleep apnea-continue CPAP.  4 obesity-we discussed the importance of diet, exercise and weight loss.  5 history of mild nonischemic cardiomyopathy-LV function normal on most recent echocardiogram.  6 hyperlipidemia-continue statin.  Kirk Ruths, MD

## 2021-07-02 ENCOUNTER — Other Ambulatory Visit: Payer: Self-pay | Admitting: Family Medicine

## 2021-07-02 DIAGNOSIS — E782 Mixed hyperlipidemia: Secondary | ICD-10-CM

## 2021-07-02 DIAGNOSIS — I10 Essential (primary) hypertension: Secondary | ICD-10-CM

## 2021-07-03 ENCOUNTER — Other Ambulatory Visit: Payer: Self-pay

## 2021-07-03 ENCOUNTER — Ambulatory Visit: Payer: Medicare PPO | Admitting: Physical Therapy

## 2021-07-03 ENCOUNTER — Encounter: Payer: Self-pay | Admitting: Physical Therapy

## 2021-07-03 DIAGNOSIS — M6281 Muscle weakness (generalized): Secondary | ICD-10-CM | POA: Diagnosis not present

## 2021-07-03 DIAGNOSIS — R262 Difficulty in walking, not elsewhere classified: Secondary | ICD-10-CM | POA: Diagnosis not present

## 2021-07-03 DIAGNOSIS — R252 Cramp and spasm: Secondary | ICD-10-CM | POA: Diagnosis not present

## 2021-07-03 DIAGNOSIS — M542 Cervicalgia: Secondary | ICD-10-CM

## 2021-07-03 DIAGNOSIS — R2681 Unsteadiness on feet: Secondary | ICD-10-CM | POA: Diagnosis not present

## 2021-07-03 NOTE — Therapy (Signed)
[place] [place], [place], [address] Phone: [phone]   Fax:  [phone]  Physical Therapy Treatment  Patient Details  Name: Connor [name] MRN: [id number] Date of Birth: [birth year] Referring Provider (PT): [place]   Encounter Date: 07/03/2021   PT End of Session - 07/03/21 1614     Visit Number 21    Date for PT Re-Evaluation 07/17/21    Authorization Type [employer]    Authorization Time Period 10/12    PT Start Time 1440    PT Stop Time 1525    PT Time Calculation (min) 45 min    Activity Tolerance Patient tolerated treatment well    Behavior During Therapy [facility] for tasks assessed/performed             Past Medical History:  Diagnosis Date   Benign localized prostatic hyperplasia with lower urinary tract symptoms (LUTS)    urologist-- dr Diona Fanti   Chronic anticoagulation    Xarelto for afib   Chronic constipation    Chronic cystitis    Gross hematuria    History of DVT of lower extremity 06/08/2010   right femoral dvt dx post op total hip 05-08-2010   History of gastric ulcer    remote   History of kidney stones    one stone. remote hx   Hyperlipidemia    Hypertension    Dx by Dr. Lance Muss around age  78    NICM (nonischemic cardiomyopathy) ([place])    per echo 06-11-2017,  ef 45-50% with diffuse hypokinesis,  G1DD   OSA on CPAP 2017   compliant with CPAP.  managed by Dr Elsworth Soho.   Osteoarthritis    Other urethral stricture, male, meatal    chronic;  pt self dilates   Paroxysmal atrial fibrillation [facility]) cardiologist-- dr Rayann Heman   first dx 2009/   a. documented by Dr Barrett Shell on office EKG 9/12. b. maintained on Tikosyn and Xarelto. c. s/p DCCV 's.  d.  x3  EP w/ ablation atrial fib last one 12-05-2016   Prediabetes    Premature ventricular contraction    PVC's (premature ventricular contractions)    RA (rheumatoid arthritis) ([place])    rheumatologist--- Dr Page Spiro,  treated with  Humira   RBBB (right bundle branch block)    Hx of a fib, ablation Aug 2018   Rheumatoid arthritis ([place])    Vitamin D deficiency    Wears hearing aid in both ears     Past Surgical History:  Procedure Laterality Date   ABLATION OF DYSRHYTHMIC FOCUS  08/29/2015   ATRIAL FIBRILLATION ABLATION  12/05/2016   ATRIAL FIBRILLATION ABLATION N/A 12/05/2016   Procedure: Atrial Fibrillation Ablation;  Surgeon: [name], MD;  Location: [place] [facility];  Service: Cardiovascular;  Laterality: N/A;   ATRIAL FIBRILLATION ABLATION N/A 02/29/2020   Procedure: ATRIAL FIBRILLATION ABLATION;  Surgeon: [name], MD;  Location: [place] [facility];  Service: Cardiovascular;  Laterality: N/A;   ATRIAL FLUTTER ABLATION  09/2010   by Glenford Bayley Right 1990s   CARDIOVERSION N/A 10/28/2012   Procedure: CARDIOVERSION;  Surgeon: Burnell Blanks, MD;  Location: [place];  Service: Cardiovascular;  Laterality: N/A;   CATARACT EXTRACTION W/ INTRAOCULAR LENS  IMPLANT, BILATERAL  2019   CYSTOSCOPY WITH BIOPSY N/A 09/03/2018   Procedure: CYSTOSCOPY WITH FULGURATION Fran Lowes;  Surgeon: Franchot Gallo, MD;  Location: [facility];  Service: Urology;  Laterality: N/A;  30 MINS  ELECTROPHYSIOLOGIC STUDY N/A 08/29/2015   Procedure: Atrial Fibrillation Ablation;  Surgeon: [name], MD;  Location: [place] [facility];  Service: Cardiovascular;  Laterality: N/A;   KNEE SURGERY Bilateral x3   1960s & 1970s   open   LEFT HEART CATHETERIZATION WITH CORONARY ANGIOGRAM N/A 06/07/2011   Procedure: LEFT HEART CATHETERIZATION WITH CORONARY ANGIOGRAM;  Surgeon: Peter M Martinique, MD;  Location: [facility] CATH LAB;  Service: Cardiovascular;  Laterality: N/A;    Normal coronary arteries,  low normal LVF   TONSILLECTOMY AND ADENOIDECTOMY  age 74   TOTAL HIP ARTHROPLASTY Right 06/04/10    dr Noemi Chapel  _0    TOTAL KNEE ARTHROPLASTY Bilateral right 1995;  left [place]  06-29-2002   dr humphries  _1     TRANSURETHRAL RESECTION OF PROSTATE N/A 07/06/2018   Procedure: TRANSURETHRAL RESECTION OF THE PROSTATE (TURP);  Surgeon: Franchot Gallo, MD;  [facility];  Service: Urology;  Laterality: N/A;  45 MINS    There were no vitals filed for this visit.   Subjective Assessment - 07/03/21 1447     Subjective Doing okay, I did some cooking yesterday and standing starts the back to hurting    Currently in Pain? No/denies                               [facility] Adult PT Treatment/Exercise - 07/03/21 0001       Knee/Hip Exercises: Stretches   Passive Hamstring Stretch Left;4 reps;20 seconds    Piriformis Stretch Left;4 reps;20 seconds    Piriformis Stretch Limitations gentle    Gastroc Stretch Both;3 reps;20 seconds    Other Knee/Hip Stretches passive knee to chest      Knee/Hip Exercises: Aerobic   Nustep Level 5 x 6 minutes    Other Aerobic UBE level 3 x 4 minutes      Knee/Hip Exercises: Machines for Strengthening   Cybex Knee Extension 5# 3x10    Cybex Knee Flexion 35# 3x10    Other Machine seated rows and lats 35# 2x10, triceps 25#, biceps 10# all with cues to engage the core, 5# straight arm pulls for core      Knee/Hip Exercises: Supine   Bridges with [employer] 20 reps    Bridges with [employer] 20 reps    Other Supine Knee/Hip Exercises feet on ball K2C, trunk rotation, small bridges and isometric abs                       PT Short Term Goals - 04/17/21 1143       PT SHORT TERM GOAL #1   Title Pt will be I and compliant with initial HEP.    Status Partially Met               PT Long Term Goals - 07/03/21 1625       PT LONG TERM GOAL #1   Title Pt will be independent with long term HEP for improved posture.    Status Partially Met      PT LONG TERM GOAL #2   Title Pt will walk for 30 minutes with no report of lightheadedness, as needed for daily exercise and errands.    Status Partially Met                    Plan - 07/03/21 1615     Clinical  Impression Statement Patient seems to be doing well, coming off the prednisone, having less pain, but does have back and hip pain after standing and doing some cooking yesterday, I am still leery of his knees and hip issues. trying to avoid a lot of stress on them while helping the back    PT Next Visit Plan continue current treatment    Consulted and Agree with Plan of Care Patient             Patient will benefit from skilled therapeutic intervention in order to improve the following deficits and impairments:  Decreased coordination, Decreased range of motion, Difficulty walking, Decreased endurance, Cardiopulmonary status limiting activity, Decreased activity tolerance, Decreased strength, Decreased mobility, Decreased balance  Visit Diagnosis: Muscle weakness (generalized)  Difficulty in walking, not elsewhere classified  Unsteadiness on feet  Cervicalgia     Problem List Patient Active Problem List   Diagnosis Date Noted   Secondary hypercoagulable state ([place]) 03/28/2020   Malignant neoplasm of prostate ([place]) 09/11/2018   Enlarged prostate with urinary obstruction 07/06/2018   Gastrocnemius strain, right, initial encounter 12/10/2017   Baker's cyst of knee, right 12/10/2017   Obesity 03/24/2017   Paroxysmal atrial fibrillation ([place]) 12/05/2016   Near syncope 04/20/2016   Leg hematoma, left, initial encounter 04/20/2016   Bleeding    Ecchymosis    Left hamstring muscle strain    Muscle tear    RBBB 82/12/1386   Chronic systolic dysfunction of left ventricle 07/11/2014   OSA (obstructive sleep apnea) 04/07/2013   Multinodular goiter 01/20/2013   Cervical spondylosis without myelopathy 01/18/2013   Atrial fibrillation with RVR ([place]) 10/27/2012   Left ventricular dysfunction 12/17/2011   Low back pain 09/11/2011   Fatigue 05/22/2011   PVC (premature ventricular contraction) 01/18/2011   Persistent atrial  fibrillation ([place]) 01/12/2011   Hyperlipidemia 08/02/2010   Hypertension 08/02/2010   Osteoarthritis 08/02/2010   BPH (benign prostatic hyperplasia) 08/02/2010   GERD (gastroesophageal reflux disease) 08/02/2010   h/o Atrial flutter [facility]) s/p ablation 2012     Sumner Boast, PT 07/03/2021, 4:26 PM  [place] [place], [place], [address] Phone: [phone]   Fax:  [phone]  Name: Connor [name] MRN: [id number] Date of Birth: 03/10/1934

## 2021-07-04 ENCOUNTER — Encounter: Payer: Self-pay | Admitting: Cardiology

## 2021-07-04 ENCOUNTER — Ambulatory Visit: Payer: Medicare PPO | Admitting: Cardiology

## 2021-07-04 VITALS — BP 120/70 | HR 76 | Ht 72.0 in | Wt 228.0 lb

## 2021-07-04 DIAGNOSIS — I428 Other cardiomyopathies: Secondary | ICD-10-CM | POA: Diagnosis not present

## 2021-07-04 DIAGNOSIS — I10 Essential (primary) hypertension: Secondary | ICD-10-CM

## 2021-07-04 DIAGNOSIS — I480 Paroxysmal atrial fibrillation: Secondary | ICD-10-CM | POA: Diagnosis not present

## 2021-07-04 NOTE — Patient Instructions (Signed)
°  Follow-Up: °At [facility], you and your health needs are our priority.  As part of our continuing mission to provide you with exceptional heart care, we have created designated Provider Care Teams.  These Care Teams include your primary Cardiologist (physician) and Advanced Practice Providers (APPs -  Physician Assistants and Nurse Practitioners) who all work together to provide you with the care you need, when you need it. ° °We recommend signing up for the patient portal called "MyChart".  Sign up information is provided on this After Visit Summary.  MyChart is used to connect with patients for Virtual Visits (Telemedicine).  Patients are able to view lab/test results, encounter notes, upcoming appointments, etc.  Non-urgent messages can be sent to your provider as well.   °To learn more about what you can do with MyChart, go to [email or web address].   ° °Your next appointment:   °6 month(s) ° °The format for your next appointment:   °In Person ° °Provider:  Brian Crenshaw MD ° °}  ° ° °

## 2021-07-05 ENCOUNTER — Ambulatory Visit: Payer: Medicare PPO | Attending: Family Medicine | Admitting: Physical Therapy

## 2021-07-05 ENCOUNTER — Encounter: Payer: Self-pay | Admitting: Physical Therapy

## 2021-07-05 ENCOUNTER — Other Ambulatory Visit: Payer: Self-pay

## 2021-07-05 DIAGNOSIS — R262 Difficulty in walking, not elsewhere classified: Secondary | ICD-10-CM | POA: Diagnosis not present

## 2021-07-05 DIAGNOSIS — R2681 Unsteadiness on feet: Secondary | ICD-10-CM | POA: Insufficient documentation

## 2021-07-05 DIAGNOSIS — M542 Cervicalgia: Secondary | ICD-10-CM | POA: Diagnosis not present

## 2021-07-05 DIAGNOSIS — M6281 Muscle weakness (generalized): Secondary | ICD-10-CM | POA: Diagnosis not present

## 2021-07-05 NOTE — Therapy (Signed)
[facility] [address] ?[place], [place], [address] ?Phone: [phone]   Fax:  [phone] ? ?Physical Therapy Treatment ? ?Patient Details  ?Name: Connor [name] ?MRN: [id number] ?Date of Birth: 03-Nov-1933 ?Referring Provider (PT): Copland ? ? ?Encounter Date: 07/05/2021 ? ? PT End of Session - 07/05/21 1513   ? ? Visit Number 22   ? Date for PT Re-Evaluation 07/17/21   ? Authorization Time Period 11/12   ? PT Start Time 1438   ? PT Stop Time 1523   ? PT Time Calculation (min) 45 min   ? Activity Tolerance Patient tolerated treatment well   ? Behavior During Therapy [facility] for tasks assessed/performed   ? ?  ?  ? ?  ? ? ?Past Medical History:  ?Diagnosis Date  ? Benign localized prostatic hyperplasia with lower urinary tract symptoms (LUTS)   ? urologist-- dr Diona Fanti  ? Chronic anticoagulation   ? Xarelto for afib  ? Chronic constipation   ? Chronic cystitis   ? Gross hematuria   ? History of DVT of lower extremity 06/08/2010  ? right femoral dvt dx post op total hip 05-08-2010  ? History of gastric ulcer   ? remote  ? History of kidney stones   ? one stone. remote hx  ? Hyperlipidemia   ? Hypertension   ? Dx by Dr. Lance Muss around age  71   ? NICM (nonischemic cardiomyopathy) ([place])   ? per echo 06-11-2017,  ef 45-50% with diffuse hypokinesis,  G1DD  ? OSA on CPAP 2017  ? compliant with CPAP.  managed by Dr Elsworth Soho.  ? Osteoarthritis   ? Other urethral stricture, male, meatal   ? chronic;  pt self dilates  ? Paroxysmal atrial fibrillation [facility]) cardiologist-- dr Rayann Heman  ? first dx 2009/   a. documented by Dr Barrett Shell on office EKG 9/12. b. maintained on Tikosyn and Xarelto. c. s/p DCCV 's.  d.  x3  EP w/ ablation atrial fib last one 12-05-2016  ? Prediabetes   ? Premature ventricular contraction   ? PVC's (premature ventricular contractions)   ? RA (rheumatoid arthritis) ([place])   ? rheumatologist--- Dr Page Spiro,  treated with Humira  ? RBBB (right bundle branch  block)   ? Hx of a fib, ablation Aug 2018  ? Rheumatoid arthritis ([place])   ? Vitamin D deficiency   ? Wears hearing aid in both ears   ? ? ?Past Surgical History:  ?Procedure Laterality Date  ? ABLATION OF DYSRHYTHMIC FOCUS  08/29/2015  ? ATRIAL FIBRILLATION ABLATION  12/05/2016  ? ATRIAL FIBRILLATION ABLATION N/A 12/05/2016  ? Procedure: Atrial Fibrillation Ablation;  Surgeon: [name], MD;  Location: [place] [facility];  Service: Cardiovascular;  Laterality: N/A;  ? ATRIAL FIBRILLATION ABLATION N/A 02/29/2020  ? Procedure: ATRIAL FIBRILLATION ABLATION;  Surgeon: [name], MD;  Location: [place] [facility];  Service: Cardiovascular;  Laterality: N/A;  ? ATRIAL FLUTTER ABLATION  09/2010  ? by Greggory Brandy  ? BUNIONECTOMY Right 1990s  ? CARDIOVERSION N/A 10/28/2012  ? Procedure: CARDIOVERSION;  Surgeon: Burnell Blanks, MD;  Location: [facility];  Service: Cardiovascular;  Laterality: N/A;  ? CATARACT EXTRACTION W/ INTRAOCULAR LENS  IMPLANT, BILATERAL  2019  ? CYSTOSCOPY WITH BIOPSY N/A 09/03/2018  ? Procedure: CYSTOSCOPY WITH FULGURATION [id number];  Surgeon: Franchot Gallo, MD;  Location: [facility];  Service: Urology;  Laterality: N/A;  30 MINS  ? ELECTROPHYSIOLOGIC STUDY N/A 08/29/2015  ?  Procedure: Atrial Fibrillation Ablation;  Surgeon: [name], MD;  Location: [place] [facility];  Service: Cardiovascular;  Laterality: N/A;  ? KNEE SURGERY Bilateral x3   1960s & 1970s  ? open  ? LEFT HEART CATHETERIZATION WITH CORONARY ANGIOGRAM N/A 06/07/2011  ? Procedure: LEFT HEART CATHETERIZATION WITH CORONARY ANGIOGRAM;  Surgeon: Peter M Martinique, MD;  Location: [facility] C[facility] LAB;  Service: Cardiovascular;  Laterality: N/A;    Normal coronary arteries,  low normal LVF  ? TONSILLECTOMY AND ADENOIDECTOMY  age 68  ? TOTAL HIP ARTHROPLASTY Right 06/04/10    dr Noemi Chapel  '@[facility]'   ? TOTAL KNEE ARTHROPLASTY Bilateral right 1995;  left 1997  ? TRANSURETHRAL RESECTION OF PROSTATE  06-29-2002   dr humphries  '@[facility]'   ? TRANSURETHRAL RESECTION OF  PROSTATE N/A 07/06/2018  ? Procedure: TRANSURETHRAL RESECTION OF THE PROSTATE (TURP);  Surgeon: Franchot Gallo, MD;  Location: [facility];  Service: Urology;  Laterality: N/A;  74 MINS  ? ? ?There were no vitals filed for this visit. ? ? Subjective Assessment - 07/05/21 1442   ? ? Subjective hip and knees are doing well   ? Currently in Pain? No/denies   ? ?  ?  ? ?  ? ? ? ? ? ? ? ? ? ? ? ? ? ? ? ? ? ? ? ? [place] Adult PT Treatment/Exercise - 07/05/21 0001   ? ?  ? Knee/Hip Exercises: Aerobic  ? Nustep Level 5 x 6 minutes   ? Other Aerobic UBE level 3 x 4 minutes   ?  ? Knee/Hip Exercises: Machines for Strengthening  ? Cybex Knee Extension 5# 3x10   ? Cybex Knee Flexion 35# 3x10   ? Other Machine seated rows and lats 35# 2x10, triceps 25#, biceps 10# all with cues to engage the core, 5# straight arm pulls for core   ?  ? Knee/Hip Exercises: Supine  ? Bridges with [employer] 20 reps   ? Bridges with Clamshell 20 reps   ?  ? Knee/Hip Exercises: Sidelying  ? Clams 5# 2x10 both sides   ? ?  ?  ? ?  ? ? ? ? ? ? ? ? ? ? ? ? PT Short Term Goals - 04/17/21 1143   ? ?  ? PT SHORT TERM GOAL #1  ? Title Pt will be I and compliant with initial HEP.   ? Status Partially Met   ? ?  ?  ? ?  ? ? ? ? PT Long Term Goals - 07/03/21 1625   ? ?  ? PT LONG TERM GOAL #1  ? Title Pt will be independent with long term HEP for improved posture.   ? Status Partially Met   ?  ? PT LONG TERM GOAL #2  ? Title Pt will walk for 30 minutes with no report of lightheadedness, as needed for daily exercise and errands.   ? Status Partially Met   ? ?  ?  ? ?  ? ? ? ? ? ? ? ? Plan - 07/05/21 1524   ? ? Clinical Impression Statement I continue to try to add exercises for his strength of core and hips without increasing pain in the hip, back or knee, no pain with activity today will add to this as we go   ? PT Next Visit Plan renewal with [facility] next visit   ? Consulted and Agree with Plan of Care Patient   ? ?  ?  ? ?  ? ? ?  Patient will  benefit from skilled therapeutic intervention in order to improve the following deficits and impairments:  Decreased coordination, Decreased range of motion, Difficulty walking, Decreased endurance, Cardiopulmonary status limiting activity, Decreased activity tolerance, Decreased strength, Decreased mobility, Decreased balance ? ?Visit Diagnosis: ?Muscle weakness (generalized) ? ?Difficulty in walking, not elsewhere classified ? ?Unsteadiness on feet ? ?Cervicalgia ? ? ? ? ?Problem List ?Patient Active Problem List  ? Diagnosis Date Noted  ? Secondary hypercoagulable state ([place]) 03/28/2020  ? Malignant neoplasm of prostate ([place]) 09/11/2018  ? Enlarged prostate with urinary obstruction 07/06/2018  ? Gastrocnemius strain, right, initial encounter 12/10/2017  ? Baker's cyst of knee, right 12/10/2017  ? Obesity 03/24/2017  ? Paroxysmal atrial fibrillation ([place]) 12/05/2016  ? Near syncope 04/20/2016  ? Leg hematoma, left, initial encounter 04/20/2016  ? Bleeding   ? Ecchymosis   ? Left hamstring muscle strain   ? Muscle tear   ? RBBB 08/30/2015  ? Chronic systolic dysfunction of left ventricle 07/11/2014  ? OSA (obstructive sleep apnea) 04/07/2013  ? Multinodular goiter 01/20/2013  ? Cervical spondylosis without myelopathy 01/18/2013  ? Atrial fibrillation with RVR ([place]) 10/27/2012  ? Left ventricular dysfunction 12/17/2011  ? Low back pain 09/11/2011  ? Fatigue 05/22/2011  ? PVC (premature ventricular contraction) 01/18/2011  ? Persistent atrial fibrillation ([place]) 01/12/2011  ? Hyperlipidemia 08/02/2010  ? Hypertension 08/02/2010  ? Osteoarthritis 08/02/2010  ? BPH (benign prostatic hyperplasia) 08/02/2010  ? GERD (gastroesophageal reflux disease) 08/02/2010  ? h/o Atrial flutter [facility]) s/p ablation 2012   ? ? Sumner Boast, PT ?07/05/2021, 4:14 PM ? ?[facility] ?[place] ?[address] ?[place], [place], [address] ?Phone: [phone]   Fax:  [phone] ? ?Name: Connor [name] ?MRN: [id number] ?Date of Birth: January 24, 1934 ? ? ? ?

## 2021-07-10 ENCOUNTER — Other Ambulatory Visit: Payer: Self-pay

## 2021-07-10 ENCOUNTER — Ambulatory Visit: Payer: Medicare PPO | Admitting: Physical Therapy

## 2021-07-10 ENCOUNTER — Encounter (INDEPENDENT_AMBULATORY_CARE_PROVIDER_SITE_OTHER): Payer: Medicare PPO | Admitting: Ophthalmology

## 2021-07-10 DIAGNOSIS — H353132 Nonexudative age-related macular degeneration, bilateral, intermediate dry stage: Secondary | ICD-10-CM

## 2021-07-10 DIAGNOSIS — H35033 Hypertensive retinopathy, bilateral: Secondary | ICD-10-CM | POA: Diagnosis not present

## 2021-07-10 DIAGNOSIS — H43813 Vitreous degeneration, bilateral: Secondary | ICD-10-CM

## 2021-07-10 DIAGNOSIS — I10 Essential (primary) hypertension: Secondary | ICD-10-CM

## 2021-07-12 ENCOUNTER — Other Ambulatory Visit: Payer: Self-pay

## 2021-07-12 ENCOUNTER — Encounter: Payer: Self-pay | Admitting: Physical Therapy

## 2021-07-12 ENCOUNTER — Ambulatory Visit: Payer: Medicare PPO | Admitting: Physical Therapy

## 2021-07-12 DIAGNOSIS — R262 Difficulty in walking, not elsewhere classified: Secondary | ICD-10-CM | POA: Diagnosis not present

## 2021-07-12 DIAGNOSIS — M6281 Muscle weakness (generalized): Secondary | ICD-10-CM | POA: Diagnosis not present

## 2021-07-12 DIAGNOSIS — R2681 Unsteadiness on feet: Secondary | ICD-10-CM | POA: Diagnosis not present

## 2021-07-12 DIAGNOSIS — M542 Cervicalgia: Secondary | ICD-10-CM | POA: Diagnosis not present

## 2021-07-12 NOTE — Therapy (Signed)
[place] [place], [place], [address] Phone: [phone]   Fax:  [phone]  Physical Therapy Treatment  Patient Details  Name: Connor [name] MRN: [id number] Date of Birth: [birth year] Referring Provider (PT): Copland   Encounter Date: 07/12/2021   PT End of Session - 07/12/21 1702     Visit Number 23    Date for PT Re-Evaluation 07/17/21    Authorization Type [employer]    Authorization Time Period 12/12    PT Start Time 1445    PT Stop Time 9774    PT Time Calculation (min) 45 min    Activity Tolerance Patient tolerated treatment well    Behavior During Therapy [facility] for tasks assessed/performed             Past Medical History:  Diagnosis Date   Benign localized prostatic hyperplasia with lower urinary tract symptoms (LUTS)    urologist-- dr Diona Fanti   Chronic anticoagulation    Xarelto for afib   Chronic constipation    Chronic cystitis    Gross hematuria    History of DVT of lower extremity 06/08/2010   right femoral dvt dx post op total hip 05-08-2010   History of gastric ulcer    remote   History of kidney stones    one stone. remote hx   Hyperlipidemia    Hypertension    Dx by Dr. Lance Muss around age  24    NICM (nonischemic cardiomyopathy) ([place])    per echo 06-11-2017,  ef 45-50% with diffuse hypokinesis,  G1DD   OSA on CPAP 2017   compliant with CPAP.  managed by Dr Elsworth Soho.   Osteoarthritis    Other urethral stricture, male, meatal    chronic;  pt self dilates   Paroxysmal atrial fibrillation [facility] cardiologist-- dr Rayann Heman   first dx 2009/   a. documented by Dr Barrett Shell on office EKG 9/12. b. maintained on Tikosyn and Xarelto. c. s/p DCCV 's.  d.  x3  EP w/ ablation atrial fib last one 12-05-2016   Prediabetes    Premature ventricular contraction    PVC's (premature ventricular contractions)    RA (rheumatoid arthritis) ([place])    rheumatologist--- Dr Page Spiro,  treated with Humira    RBBB (right bundle branch block)    Hx of a fib, ablation Aug 2018   Rheumatoid arthritis ([place])    Vitamin D deficiency    Wears hearing aid in both ears     Past Surgical History:  Procedure Laterality Date   ABLATION OF DYSRHYTHMIC FOCUS  08/29/2015   ATRIAL FIBRILLATION ABLATION  12/05/2016   ATRIAL FIBRILLATION ABLATION N/A 12/05/2016   Procedure: Atrial Fibrillation Ablation;  Surgeon: [name], MD;  Location: [place] [facility];  Service: Cardiovascular;  Laterality: N/A;   ATRIAL FIBRILLATION ABLATION N/A 02/29/2020   Procedure: ATRIAL FIBRILLATION ABLATION;  Surgeon: [name], MD;  Location: [place] [facility];  Service: Cardiovascular;  Laterality: N/A;   ATRIAL FLUTTER ABLATION  09/2010   by Glenford Bayley Right 1990s   CARDIOVERSION N/A 10/28/2012   Procedure: CARDIOVERSION;  Surgeon: Burnell Blanks, MD;  Location: [place];  Service: Cardiovascular;  Laterality: N/A;   CATARACT EXTRACTION W/ INTRAOCULAR LENS  IMPLANT, BILATERAL  2019   CYSTOSCOPY WITH BIOPSY N/A 09/03/2018   Procedure: CYSTOSCOPY WITH FULGURATION [place];  Surgeon: Franchot Gallo, MD;  [facility];  Service: Urology;  Laterality: N/A;  30 MINS  ELECTROPHYSIOLOGIC STUDY N/A 08/29/2015   Procedure: Atrial Fibrillation Ablation;  Surgeon: [name], MD;  Location: [facility] [place] [facility];  Service: Cardiovascular;  Laterality: N/A;   KNEE SURGERY Bilateral x3   1960s & 1970s   open   LEFT HEART CATHETERIZATION WITH CORONARY ANGIOGRAM N/A 06/07/2011   Procedure: LEFT HEART CATHETERIZATION WITH CORONARY ANGIOGRAM;  Surgeon: Peter M Martinique, MD;  Location: [facility] CATH LAB;  Service: Cardiovascular;  Laterality: N/A;    Normal coronary arteries,  low normal LVF   TONSILLECTOMY AND ADENOIDECTOMY  age 10   TOTAL HIP ARTHROPLASTY Right 06/04/10    dr Noemi Chapel  '@[facility]'    TOTAL KNEE ARTHROPLASTY Bilateral right 1995;  left 1997   TRANSURETHRAL RESECTION OF PROSTATE  06-29-2002   dr humphries  '@[facility]'     TRANSURETHRAL RESECTION OF PROSTATE N/A 07/06/2018   Procedure: TRANSURETHRAL RESECTION OF THE PROSTATE (TURP);  Surgeon: Franchot Gallo, MD;  Location: [facility];  Service: Urology;  Laterality: N/A;  45 MINS    There were no vitals filed for this visit.   Subjective Assessment - 07/12/21 1445     Subjective I went to the gym today and did a tour, still looking at the machines, it can be overwhelming                               [facility] Adult PT Treatment/Exercise - 07/12/21 0001       Knee/Hip Exercises: Aerobic   Nustep Level 5 x 6 minutes    Other Aerobic UBE level 3 x 4 minutes      Knee/Hip Exercises: Machines for Strengthening   Cybex Knee Extension 10# 3x10, cues for TKE    Cybex Knee Flexion 35# 3x10    Other Machine seated rows and lats 35# 2x10, triceps 25#, biceps 10# all with cues to engage the core, 10# straight arm pulls for core      Knee/Hip Exercises: Supine   Bridges with [employer] 20 reps    Bridges with Clamshell 20 reps    Other Supine Knee/Hip Exercises feet on ball K2C, trunk rotation, small bridges and isometric abs      Knee/Hip Exercises: Sidelying   Clams 5# 2x10 both sides                       PT Short Term Goals - 04/17/21 1143       PT SHORT TERM GOAL #1   Title Pt will be I and compliant with initial HEP.    Status Partially Met               PT Long Term Goals - 07/12/21 1904       PT LONG TERM GOAL #1   Title Pt will be independent with long term HEP for improved posture.    Status Partially Met      PT LONG TERM GOAL #2   Title Pt will walk for 30 minutes with no report of lightheadedness, as needed for daily exercise and errands.    Status Partially Met      PT LONG TERM GOAL #3   Title decrease TUG to 12 seconds    Status Partially Met      PT LONG TERM GOAL #4   Title walk 1000 feet without difficulty during 6 minute walk test    Status Partially Met      PT  LONG  TERM GOAL #5   Title increase berg balance score to 50/56 to decrease fall risk    Status Achieved                   Plan - 07/12/21 1901     Clinical Impression Statement Overall patient is doing well.  He had some flare up of hip and back pain over the past 6 weeks that we worked with him on calming down, he saw MD and had MRI has an old lumbar compression fracture and an alar pelvic fracture with severe OA.  He has irritation with walking and stretching.  WE backed off and tried to continue to work on strength in different ways that did not use a lot of weight bearing, he also has bad knees so we have had to work around this.  He is overall feeling much better, and is back to looking at the gym to go to, he would like to safely progress to going to his gym, we will work on this over the next period and hoepfully be able to see him as he does this to assure his safety    PT Next Visit Plan request visits from [facility] and Agree with Plan of Care Patient             Patient will benefit from skilled therapeutic intervention in order to improve the following deficits and impairments:  Decreased coordination, Decreased range of motion, Difficulty walking, Decreased endurance, Cardiopulmonary status limiting activity, Decreased activity tolerance, Decreased strength, Decreased mobility, Decreased balance  Visit Diagnosis: Muscle weakness (generalized)  Difficulty in walking, not elsewhere classified  Unsteadiness on feet     Problem List Patient Active Problem List   Diagnosis Date Noted   Secondary hypercoagulable state ([place]) 03/28/2020   Malignant neoplasm of prostate ([place]) 09/11/2018   Enlarged prostate with urinary obstruction 07/06/2018   Gastrocnemius strain, right, initial encounter 12/10/2017   Baker's cyst of knee, right 12/10/2017   Obesity 03/24/2017   Paroxysmal atrial fibrillation ([place]) 12/05/2016   Near syncope 04/20/2016   Leg hematoma,  left, initial encounter 04/20/2016   Bleeding    Ecchymosis    Left hamstring muscle strain    Muscle tear    RBBB 81/38/8719   Chronic systolic dysfunction of left ventricle 07/11/2014   OSA (obstructive sleep apnea) 04/07/2013   Multinodular goiter 01/20/2013   Cervical spondylosis without myelopathy 01/18/2013   Atrial fibrillation with RVR ([place]) 10/27/2012   Left ventricular dysfunction 12/17/2011   Low back pain 09/11/2011   Fatigue 05/22/2011   PVC (premature ventricular contraction) 01/18/2011   Persistent atrial fibrillation ([place]) 01/12/2011   Hyperlipidemia 08/02/2010   Hypertension 08/02/2010   Osteoarthritis 08/02/2010   BPH (benign prostatic hyperplasia) 08/02/2010   GERD (gastroesophageal reflux disease) 08/02/2010   h/o Atrial flutter [facility]) s/p ablation 2012     Sumner Boast, PT 07/12/2021, 7:05 PM  [place] [place], [place], [address] Phone: [phone]   Fax:  [phone]  Name: Connor [name] MRN: [id number] Date of Birth: 06/13/1933

## 2021-07-16 ENCOUNTER — Ambulatory Visit: Payer: Medicare PPO | Admitting: Physical Therapy

## 2021-07-17 DIAGNOSIS — C61 Malignant neoplasm of prostate: Secondary | ICD-10-CM | POA: Diagnosis not present

## 2021-07-25 ENCOUNTER — Encounter: Payer: Self-pay | Admitting: Physical Therapy

## 2021-07-25 ENCOUNTER — Other Ambulatory Visit: Payer: Self-pay

## 2021-07-25 ENCOUNTER — Ambulatory Visit: Payer: Medicare PPO | Admitting: Physical Therapy

## 2021-07-25 DIAGNOSIS — M542 Cervicalgia: Secondary | ICD-10-CM

## 2021-07-25 DIAGNOSIS — R2681 Unsteadiness on feet: Secondary | ICD-10-CM | POA: Diagnosis not present

## 2021-07-25 DIAGNOSIS — M6281 Muscle weakness (generalized): Secondary | ICD-10-CM | POA: Diagnosis not present

## 2021-07-25 DIAGNOSIS — R262 Difficulty in walking, not elsewhere classified: Secondary | ICD-10-CM

## 2021-07-25 NOTE — Therapy (Signed)
[place] [address] ?[place], [place], [address] ?Phone: [phone]   Fax:  [phone] ? ?Physical Therapy Treatment ? ?Patient Details  ?Name: Connor [name] ?MRN: [id number] ?Date of Birth: 07/05/1933 ?Referring Provider (PT): [place] ? ? ?Encounter Date: 07/25/2021 ? ? PT End of Session - 07/25/21 1145   ? ? Visit Number 24   ? Date for PT Re-Evaluation 10/11/21   ? Authorization Type [employer]   ? Authorization Time Period 1/10   ? PT Start Time 1054   ? PT Stop Time 2751   ? PT Time Calculation (min) 48 min   ? Activity Tolerance Patient tolerated treatment well   ? Behavior During Therapy [facility] for tasks assessed/performed   ? ?  ?  ? ?  ? ? ?Past Medical History:  ?Diagnosis Date  ? Benign localized prostatic hyperplasia with lower urinary tract symptoms (LUTS)   ? urologist-- dr Diona Fanti  ? Chronic anticoagulation   ? Xarelto for afib  ? Chronic constipation   ? Chronic cystitis   ? Gross hematuria   ? History of DVT of lower extremity 06/08/2010  ? right femoral dvt dx post op total hip 05-08-2010  ? History of gastric ulcer   ? remote  ? History of kidney stones   ? one stone. remote hx  ? Hyperlipidemia   ? Hypertension   ? Dx by Dr. Lance Muss around age  72   ? NICM (nonischemic cardiomyopathy) ([place])   ? per echo 06-11-2017,  ef 45-50% with diffuse hypokinesis,  G1DD  ? OSA on CPAP 2017  ? compliant with CPAP.  managed by Dr Elsworth Soho.  ? Osteoarthritis   ? Other urethral stricture, male, meatal   ? chronic;  pt self dilates  ? Paroxysmal atrial fibrillation [facility]) cardiologist-- dr Rayann Heman  ? first dx 2009/   a. documented by Dr Barrett Shell on office EKG 9/12. b. maintained on Tikosyn and Xarelto. c. s/p DCCV 's.  d.  x3  EP w/ ablation atrial fib last one 12-05-2016  ? Prediabetes   ? Premature ventricular contraction   ? PVC's (premature ventricular contractions)   ? RA (rheumatoid arthritis) ([place])   ? rheumatologist--- Dr Page Spiro,  treated with Humira   ? RBBB (right bundle branch block)   ? Hx of a fib, ablation Aug 2018  ? Rheumatoid arthritis ([place])   ? Vitamin D deficiency   ? Wears hearing aid in both ears   ? ? ?Past Surgical History:  ?Procedure Laterality Date  ? ABLATION OF DYSRHYTHMIC FOCUS  08/29/2015  ? ATRIAL FIBRILLATION ABLATION  12/05/2016  ? ATRIAL FIBRILLATION ABLATION N/A 12/05/2016  ? Procedure: Atrial Fibrillation Ablation;  Surgeon: [name], MD;  Location: [place] CV [facility];  Service: Cardiovascular;  Laterality: N/A;  ? ATRIAL FIBRILLATION ABLATION N/A 02/29/2020  ? Procedure: ATRIAL FIBRILLATION ABLATION;  Surgeon: [name], MD;  Location: [place] CV [facility];  Service: Cardiovascular;  Laterality: N/A;  ? ATRIAL FLUTTER ABLATION  09/2010  ? by Greggory Brandy  ? BUNIONECTOMY Right 1990s  ? CARDIOVERSION N/A 10/28/2012  ? Procedure: CARDIOVERSION;  Surgeon: Burnell Blanks, MD;  Location: [place];  Service: Cardiovascular;  Laterality: N/A;  ? CATARACT EXTRACTION W/ INTRAOCULAR LENS  IMPLANT, BILATERAL  2019  ? CYSTOSCOPY WITH BIOPSY N/A 09/03/2018  ? Procedure: CYSTOSCOPY WITH FULGURATION [id number];  Surgeon: Franchot Gallo, MD;  Location: [facility];  Service: Urology;  Laterality: N/A;  30 MINS  ?  ELECTROPHYSIOLOGIC STUDY N/A 08/29/2015  ? Procedure: Atrial Fibrillation Ablation;  Surgeon: [name], MD;  Location: [place] [facility];  Service: Cardiovascular;  Laterality: N/A;  ? KNEE SURGERY Bilateral x3   1960s & 1970s  ? open  ? LEFT HEART CATHETERIZATION WITH CORONARY ANGIOGRAM N/A 06/07/2011  ? Procedure: LEFT HEART CATHETERIZATION WITH CORONARY ANGIOGRAM;  Surgeon: Peter M Martinique, MD;  [facility] CATH LAB;  Service: Cardiovascular;  Laterality: N/A;    Normal coronary arteries,  low normal LVF  ? TONSILLECTOMY AND ADENOIDECTOMY  age 16  ? TOTAL HIP ARTHROPLASTY Right 06/04/10    dr Noemi Chapel  '@[facility]'   ? TOTAL KNEE ARTHROPLASTY Bilateral right 1995;  left 1997  ? TRANSURETHRAL RESECTION OF PROSTATE  06-29-2002   dr humphries  '@[facility]'   ?  TRANSURETHRAL RESECTION OF PROSTATE N/A 07/06/2018  ? Procedure: TRANSURETHRAL RESECTION OF THE PROSTATE (TURP);  Surgeon: Franchot Gallo, MD;  Location[facility];  Service: Urology;  Laterality: N/A;  107 MINS  ? ? ?There were no vitals filed for this visit. ? ? Subjective Assessment - 07/25/21 1055   ? ? Subjective I was at the beach last week, had a little more pain in the morning   ? Currently in Pain? No/denies   ? ?  ?  ? ?  ? ? ? ? ? ? ? ? ? ? ? ? ? ? ? ? ? ? ? ? [place] Adult PT Treatment/Exercise - 07/25/21 0001   ? ?  ? Knee/Hip Exercises: Aerobic  ? Nustep Level 5 x 6 minutes   ? Other Aerobic UBE level 3 x 4 minutes   ?  ? Knee/Hip Exercises: Machines for Strengthening  ? Cybex Knee Flexion 35# 3x10   ? Other Machine seated rows and lats 35# 2x10, triceps 25#, biceps 10# all with cues to engage the core, 10# straight arm pulls for core   ?  ? Knee/Hip Exercises: Supine  ? Bridges with [employer] 20 reps   ? Bridges with Clamshell 20 reps   ? Other Supine Knee/Hip Exercises feet on ball K2C, trunk rotation, small bridges and isometric abs   ?  ? Knee/Hip Exercises: Sidelying  ? Clams 5# 2x10 both sides   ? ?  ?  ? ?  ? ? ? ? ? ? ? ? ? ? ? ? PT Short Term Goals - 04/17/21 1143   ? ?  ? PT SHORT TERM GOAL #1  ? Title Pt will be I and compliant with initial HEP.   ? Status Partially Met   ? ?  ?  ? ?  ? ? ? ? PT Long Term Goals - 07/25/21 1149   ? ?  ? PT LONG TERM GOAL #1  ? Title Pt will be independent with long term HEP for improved posture.   ? Status Partially Met   ? ?  ?  ? ?  ? ? ? ? ? ? ? ? Plan - 07/25/21 1145   ? ? Clinical Impression Statement Patient has been out the past two weeks, he reports a little bit of a backslide, he reports no real increase in pain just a regression in his fitness level, reports fatigue, he has not been to the gym, I spoke with him about our goals of getting him to start the gym program and be safe, I have encouraged him to go and try a few things,  then we will see him and try to  problem solve and assure that he is safe   ? PT Next Visit Plan will decrease to 1x/week   ? Consulted and Agree with Plan of Care Patient   ? ?  ?  ? ?  ? ? ?Patient will benefit from skilled therapeutic intervention in order to improve the following deficits and impairments:  Decreased coordination, Decreased range of motion, Difficulty walking, Decreased endurance, Cardiopulmonary status limiting activity, Decreased activity tolerance, Decreased strength, Decreased mobility, Decreased balance ? ?Visit Diagnosis: ?Muscle weakness (generalized) ? ?Difficulty in walking, not elsewhere classified ? ?Unsteadiness on feet ? ?Cervicalgia ? ? ? ? ?Problem List ?Patient Active Problem List  ? Diagnosis Date Noted  ? Secondary hypercoagulable state ([place]) 03/28/2020  ? Malignant neoplasm of prostate ([place]) 09/11/2018  ? Enlarged prostate with urinary obstruction 07/06/2018  ? Gastrocnemius strain, right, initial encounter 12/10/2017  ? Baker's cyst of knee, right 12/10/2017  ? Obesity 03/24/2017  ? Paroxysmal atrial fibrillation ([place]) 12/05/2016  ? Near syncope 04/20/2016  ? Leg hematoma, left, initial encounter 04/20/2016  ? Bleeding   ? Ecchymosis   ? Left hamstring muscle strain   ? Muscle tear   ? RBBB 08/30/2015  ? Chronic systolic dysfunction of left ventricle 07/11/2014  ? OSA (obstructive sleep apnea) 04/07/2013  ? Multinodular goiter 01/20/2013  ? Cervical spondylosis without myelopathy 01/18/2013  ? Atrial fibrillation with RVR ([place]) 10/27/2012  ? Left ventricular dysfunction 12/17/2011  ? Low back pain 09/11/2011  ? Fatigue 05/22/2011  ? PVC (premature ventricular contraction) 01/18/2011  ? Persistent atrial fibrillation ([place]) 01/12/2011  ? Hyperlipidemia 08/02/2010  ? Hypertension 08/02/2010  ? Osteoarthritis 08/02/2010  ? BPH (benign prostatic hyperplasia) 08/02/2010  ? GERD (gastroesophageal reflux disease) 08/02/2010  ? h/o Atrial flutter [facility]) s/p ablation 2012    ? ? Sumner Boast, PT ?07/25/2021, 11:50 AM ? ?[place] ?[address] ?[address] ?[place], [place], [address] ?Phone: [phone]   Fax:  [phone] ? ?Name: Mak Bonny Dods

## 2021-07-26 DIAGNOSIS — H353132 Nonexudative age-related macular degeneration, bilateral, intermediate dry stage: Secondary | ICD-10-CM | POA: Diagnosis not present

## 2021-07-27 DIAGNOSIS — E349 Endocrine disorder, unspecified: Secondary | ICD-10-CM | POA: Diagnosis not present

## 2021-07-27 DIAGNOSIS — N401 Enlarged prostate with lower urinary tract symptoms: Secondary | ICD-10-CM | POA: Diagnosis not present

## 2021-07-27 DIAGNOSIS — R300 Dysuria: Secondary | ICD-10-CM | POA: Diagnosis not present

## 2021-07-27 DIAGNOSIS — C61 Malignant neoplasm of prostate: Secondary | ICD-10-CM | POA: Diagnosis not present

## 2021-07-31 ENCOUNTER — Other Ambulatory Visit: Payer: Self-pay

## 2021-07-31 ENCOUNTER — Encounter: Payer: Self-pay | Admitting: Physical Therapy

## 2021-07-31 ENCOUNTER — Ambulatory Visit: Payer: Medicare PPO | Admitting: Physical Therapy

## 2021-07-31 DIAGNOSIS — M6281 Muscle weakness (generalized): Secondary | ICD-10-CM | POA: Diagnosis not present

## 2021-07-31 DIAGNOSIS — R2681 Unsteadiness on feet: Secondary | ICD-10-CM

## 2021-07-31 DIAGNOSIS — R262 Difficulty in walking, not elsewhere classified: Secondary | ICD-10-CM | POA: Diagnosis not present

## 2021-07-31 DIAGNOSIS — M542 Cervicalgia: Secondary | ICD-10-CM | POA: Diagnosis not present

## 2021-07-31 NOTE — Therapy (Signed)
[place] [place] ?[address] ?[place], [place], [address] ?Phone: [phone]   Fax:  [phone] ? ?Physical Therapy Treatment ? ?Patient Details  ?Name: Connor [name] ?MRN: [id number] ?Date of Birth: August 17, 1933 ?Referring Provider (PT): Cop[place] ? ? ?Encounter Date: 07/31/2021 ? ? PT End of Session - 07/31/21 1510   ? ? Visit Number 25   ? Date for PT Re-Evaluation 10/11/21   ? Authorization Type Hum[employer]   ? Authorization Time Period 2/10   ? PT Start Time 1440   ? PT Stop Time 1524   ? PT Time Calculation (min) 44 min   ? Activity Tolerance Patient tolerated treatment well   ? Behavior During Therapy [facility] for tasks assessed/performed   ? ?  ?  ? ?  ? ? ?Past Medical History:  ?Diagnosis Date  ? Benign localized prostatic hyperplasia with lower urinary tract symptoms (LUTS)   ? urologist-- dr Diona Fanti  ? Chronic anticoagulation   ? Xarelto for afib  ? Chronic constipation   ? Chronic cystitis   ? Gross hematuria   ? History of DVT of lower extremity 06/08/2010  ? right femoral dvt dx post op total hip 05-08-2010  ? History of gastric ulcer   ? remote  ? History of kidney stones   ? one stone. remote hx  ? Hyperlipidemia   ? Hypertension   ? Dx by Dr. Lance Muss around age  70   ? NICM (nonischemic cardiomyopathy) ([place])   ? per echo 06-11-2017,  ef 45-50% with diffuse hypokinesis,  G1DD  ? OSA on CPAP 2017  ? compliant with CPAP.  managed by Dr Elsworth Soho.  ? Osteoarthritis   ? Other urethral stricture, male, meatal   ? chronic;  pt self dilates  ? Paroxysmal atrial fibrillation [facility]) cardiologist-- dr Rayann Heman  ? first dx 2009/   a. documented by Dr Barrett Shell on office EKG 9/12. b. maintained on Tikosyn and Xarelto. c. s/p DCCV 's.  d.  x3  EP w/ ablation atrial fib last one 12-05-2016  ? Prediabetes   ? Premature ventricular contraction   ? PVC's (premature ventricular contractions)   ? RA (rheumatoid arthritis) ([place])   ? rheumatologist--- Dr Page Spiro,  treated with Humira   ? RBBB (right bundle branch block)   ? Hx of a fib, ablation Aug 2018  ? Rheumatoid arthritis ([place])   ? Vitamin D deficiency   ? Wears hearing aid in both ears   ? ? ?Past Surgical History:  ?Procedure Laterality Date  ? ABLATION OF DYSRHYTHMIC FOCUS  08/29/2015  ? ATRIAL FIBRILLATION ABLATION  12/05/2016  ? ATRIAL FIBRILLATION ABLATION N/A 12/05/2016  ? Procedure: Atrial Fibrillation Ablation;  Surgeon: [name], MD;  Location: [place] [facility];  Service: Cardiovascular;  Laterality: N/A;  ? ATRIAL FIBRILLATION ABLATION N/A 02/29/2020  ? Procedure: ATRIAL FIBRILLATION ABLATION;  Surgeon: [name], MD;  Location: [place] [facility];  Service: Cardiovascular;  Laterality: N/A;  ? ATRIAL FLUTTER ABLATION  09/2010  ? by Greggory Brandy  ? BUNIONECTOMY Right 1990s  ? CARDIOVERSION N/A 10/28/2012  ? Procedure: CARDIOVERSION;  Surgeon: Burnell Blanks, MD;  Location: [place];  Service: Cardiovascular;  Laterality: N/A;  ? CATARACT EXTRACTION W/ INTRAOCULAR LENS  IMPLANT, BILATERAL  2019  ? CYSTOSCOPY WITH BIOPSY N/A 09/03/2018  ? Procedure: CYSTOSCOPY WITH FULGURATION [id number];  Surgeon: Franchot Gallo, MD;  [facility];  Service: Urology;  Laterality: N/A;  30 MINS  ?  ELECTROPHYSIOLOGIC STUDY N/A 08/29/2015  ? Procedure: Atrial Fibrillation Ablation;  Surgeon: [name], MD;  Location: [place] [facility];  Service: Cardiovascular;  Laterality: N/A;  ? KNEE SURGERY Bilateral x3   1960s & 1970s  ? open  ? LEFT HEART CATHETERIZATION WITH CORONARY ANGIOGRAM N/A 06/07/2011  ? Procedure: LEFT HEART CATHETERIZATION WITH CORONARY ANGIOGRAM;  Surgeon: Peter M Martinique, MD;  Location: [facility] CATH LAB;  Service: Cardiovascular;  Laterality: N/A;    Normal coronary arteries,  low normal LVF  ? TONSILLECTOMY AND ADENOIDECTOMY  age 11  ? TOTAL HIP ARTHROPLASTY Right 06/04/10    dr Noemi Chapel  '@[facility]'   ? TOTAL KNEE ARTHROPLASTY Bilateral right 1995;  left 1997  ? TRANSURETHRAL RESECTION OF PROSTATE  06-29-2002   dr humphries  '@[facility]'   ?  TRANSURETHRAL RESECTION OF PROSTATE N/A 07/06/2018  ? Procedure: TRANSURETHRAL RESECTION OF THE PROSTATE (TURP);  Surgeon: Franchot Gallo, MD;  Location: [facility];  Service: Urology;  Laterality: N/A;  39 MINS  ? ? ?There were no vitals filed for this visit. ? ? Subjective Assessment - 07/31/21 1447   ? ? Subjective I have no cancer, no pain   ? Currently in Pain? No/denies   ? ?  ?  ? ?  ? ? ? ? ? ? ? ? ? ? ? ? ? ? ? ? ? ? ? ? [place] Adult PT Treatment/Exercise - 07/31/21 0001   ? ?  ? Knee/Hip Exercises: Aerobic  ? Nustep Level 5 x 6 minutes   ?  ? Knee/Hip Exercises: Machines for Strengthening  ? Other Machine seated rows and lats 35# 2x10, triceps 25#, biceps 10# all with cues to engage the core, 10# straight arm pulls for core   ?  ? Knee/Hip Exercises: Supine  ? Bridges with [employer] 20 reps   ? Bridges with [employer] 20 reps   ? Other Supine Knee/Hip Exercises feet on ball K2C, trunk rotation, small bridges and isometric abs   ?  ? Knee/Hip Exercises: Sidelying  ? Clams 5# 2x10 both sides   ? ?  ?  ? ?  ? ? ? ? ? ? ? ? ? ? ? ? PT Short Term Goals - 04/17/21 1143   ? ?  ? PT SHORT TERM GOAL #1  ? Title Pt will be I and compliant with initial HEP.   ? Status Partially Met   ? ?  ?  ? ?  ? ? ? ? PT Long Term Goals - 07/31/21 1513   ? ?  ? PT LONG TERM GOAL #1  ? Title Pt will be independent with long term HEP for improved posture.   ? Status Partially Met   ? ?  ?  ? ?  ? ? ? ? ? ? ? ? Plan - 07/31/21 1510   ? ? Clinical Impression Statement Patient got good news no cancer, reports he is doing better overall, reports difficulty standing for any length of time,  He tried the gym again ,m still feels unsure of this as he is not able to adjust the machines, reports that no one is there to help.  I worked with him and had him try to adjust the machines, he needed help and demonstration   ? PT Next Visit Plan will decrease to 1x/week   ? Consulted and Agree with Plan of Care Patient   ? ?  ?   ? ?  ? ? ?Patient will benefit  from skilled therapeutic intervention in order to improve the following deficits and impairments:  Decreased coordination, Decreased range of motion, Difficulty walking, Decreased endurance, Cardiopulmonary status limiting activity, Decreased activity tolerance, Decreased strength, Decreased mobility, Decreased balance ? ?Visit Diagnosis: ?Muscle weakness (generalized) ? ?Difficulty in walking, not elsewhere classified ? ?Unsteadiness on feet ? ? ? ? ?Problem List ?Patient Active Problem List  ? Diagnosis Date Noted  ? Secondary hypercoagulable state ([place]) 03/28/2020  ? Malignant neoplasm of prostate ([place]) 09/11/2018  ? Enlarged prostate with urinary obstruction 07/06/2018  ? Gastrocnemius strain, right, initial encounter 12/10/2017  ? Baker's cyst of knee, right 12/10/2017  ? Obesity 03/24/2017  ? Paroxysmal atrial fibrillation ([place]) 12/05/2016  ? Near syncope 04/20/2016  ? Leg hematoma, left, initial encounter 04/20/2016  ? Bleeding   ? Ecchymosis   ? Left hamstring muscle strain   ? Muscle tear   ? RBBB 08/30/2015  ? Chronic systolic dysfunction of left ventricle 07/11/2014  ? OSA (obstructive sleep apnea) 04/07/2013  ? Multinodular goiter 01/20/2013  ? Cervical spondylosis without myelopathy 01/18/2013  ? Atrial fibrillation with RVR ([place]) 10/27/2012  ? Left ventricular dysfunction 12/17/2011  ? Low back pain 09/11/2011  ? Fatigue 05/22/2011  ? PVC (premature ventricular contraction) 01/18/2011  ? Persistent atrial fibrillation ([place]) 01/12/2011  ? Hyperlipidemia 08/02/2010  ? Hypertension 08/02/2010  ? Osteoarthritis 08/02/2010  ? BPH (benign prostatic hyperplasia) 08/02/2010  ? GERD (gastroesophageal reflux disease) 08/02/2010  ? h/o Atrial flutter [facility]) s/p ablation 2012   ? ? Sumner Boast, PT ?07/31/2021, 3:28 PM ? ?[place] ?[address] ?[address] ?[place], [place], [address] ?Phone: [phone]   Fax:  [phone] ? ?Name:  Connor [name] ?MRN: [id number] ?Date of Birth: Dec 29, 1933 ? ? ? ?

## 2021-08-02 ENCOUNTER — Encounter: Payer: Self-pay | Admitting: Physical Therapy

## 2021-08-02 ENCOUNTER — Ambulatory Visit: Payer: Medicare PPO | Admitting: Physical Therapy

## 2021-08-02 DIAGNOSIS — R2681 Unsteadiness on feet: Secondary | ICD-10-CM

## 2021-08-02 DIAGNOSIS — M542 Cervicalgia: Secondary | ICD-10-CM

## 2021-08-02 DIAGNOSIS — M6281 Muscle weakness (generalized): Secondary | ICD-10-CM | POA: Diagnosis not present

## 2021-08-02 DIAGNOSIS — R262 Difficulty in walking, not elsewhere classified: Secondary | ICD-10-CM | POA: Diagnosis not present

## 2021-08-02 NOTE — Therapy (Signed)
[place] ?[place] ?[place], [place], [address] ?Phone: [phone]   Fax:  [phone] ? ?Physical Therapy Treatment ? ?Patient Details  ?Name: Connor [name] ?MRN: [id number] ?Date of Birth: 1933-12-30 ?Referring Provider (PT): [place] ? ? ?Encounter Date: 08/02/2021 ? ? PT End of Session - 08/02/21 1525   ? ? Visit Number 26   ? Date for PT Re-Evaluation 10/11/21   ? Authorization Type [employer]   ? Authorization Time Period 3/10   ? PT Start Time 1440   ? PT Stop Time 1520   ? PT Time Calculation (min) 40 min   ? Activity Tolerance Patient tolerated treatment well   ? Behavior During Therapy [facility] for tasks assessed/performed   ? ?  ?  ? ?  ? ? ?Past Medical History:  ?Diagnosis Date  ? Benign localized prostatic hyperplasia with lower urinary tract symptoms (LUTS)   ? urologist-- dr Diona Fanti  ? Chronic anticoagulation   ? Xarelto for afib  ? Chronic constipation   ? Chronic cystitis   ? Gross hematuria   ? History of DVT of lower extremity 06/08/2010  ? right femoral dvt dx post op total hip 05-08-2010  ? History of gastric ulcer   ? remote  ? History of kidney stones   ? one stone. remote hx  ? Hyperlipidemia   ? Hypertension   ? Dx by Dr. Lance Muss around age  69   ? NICM (nonischemic cardiomyopathy) ([place])   ? per echo 06-11-2017,  ef 45-50% with diffuse hypokinesis,  G1DD  ? OSA on CPAP 2017  ? compliant with CPAP.  managed by Dr Elsworth Soho.  ? Osteoarthritis   ? Other urethral stricture, male, meatal   ? chronic;  pt self dilates  ? Paroxysmal atrial fibrillation [facility]) cardiologist-- dr Rayann Heman  ? first dx 2009/   a. documented by Dr Barrett Shell on office EKG 9/12. b. maintained on Tikosyn and Xarelto. c. s/p DCCV 's.  d.  x3  EP w/ ablation atrial fib last one 12-05-2016  ? Prediabetes   ? Premature ventricular contraction   ? PVC's (premature ventricular contractions)   ? RA (rheumatoid arthritis) ([place])   ? rheumatologist--- Dr Page Spiro,  treated with Humira   ? RBBB (right bundle branch block)   ? Hx of a fib, ablation Aug 2018  ? Rheumatoid arthritis ([place])   ? Vitamin D deficiency   ? Wears hearing aid in both ears   ? ? ?Past Surgical History:  ?Procedure Laterality Date  ? ABLATION OF DYSRHYTHMIC FOCUS  08/29/2015  ? ATRIAL FIBRILLATION ABLATION  12/05/2016  ? ATRIAL FIBRILLATION ABLATION N/A 12/05/2016  ? Procedure: Atrial Fibrillation Ablation;  Surgeon: [name], MD;  Location: [place] CV LAB;  Service: Cardiovascular;  Laterality: N/A;  ? ATRIAL FIBRILLATION ABLATION N/A 02/29/2020  ? Procedure: ATRIAL FIBRILLATION ABLATION;  Surgeon: [name], MD;  Location: [place] CV LAB;  Service: Cardiovascular;  Laterality: N/A;  ? ATRIAL FLUTTER ABLATION  09/2010  ? by Greggory Brandy  ? BUNIONECTOMY Right 1990s  ? CARDIOVERSION N/A 10/28/2012  ? Procedure: CARDIOVERSION;  Surgeon: Burnell Blanks, MD;  Location: [place];  Service: Cardiovascular;  Laterality: N/A;  ? CATARACT EXTRACTION W/ INTRAOCULAR LENS  IMPLANT, BILATERAL  2019  ? CYSTOSCOPY WITH BIOPSY N/A 09/03/2018  ? Procedure: CYSTOSCOPY WITH FULGURATION [id number];  Surgeon: Franchot Gallo, MD;  Location: [facility];  Service: Urology;  Laterality: N/A;  30 MINS  ?  ELECTROPHYSIOLOGIC STUDY N/A 08/29/2015  ? Procedure: Atrial Fibrillation Ablation;  Surgeon: [name], MD;  Location: [place] CV LAB;  Service: Cardiovascular;  Laterality: N/A;  ? KNEE SURGERY Bilateral x3   1960s & 1970s  ? open  ? LEFT HEART CATHETERIZATION WITH CORONARY ANGIOGRAM N/A 06/07/2011  ? Procedure: LEFT HEART CATHETERIZATION WITH CORONARY ANGIOGRAM;  Surgeon: Peter M Martinique, MD;  Location: [facility] CATH LAB;  Service: Cardiovascular;  Laterality: N/A;    Normal coronary arteries,  low normal LVF  ? TONSILLECTOMY AND ADENOIDECTOMY  age 31  ? TOTAL HIP ARTHROPLASTY Right 06/04/10    dr Noemi Chapel  '@[facility]'   ? TOTAL KNEE ARTHROPLASTY Bilateral right 1995;  left 1997  ? TRANSURETHRAL RESECTION OF PROSTATE  06-29-2002   dr humphries  '@[facility]'   ?  TRANSURETHRAL RESECTION OF PROSTATE N/A 07/06/2018  ? Procedure: TRANSURETHRAL RESECTION OF THE PROSTATE (TURP);  Surgeon: Franchot Gallo, MD;  Location: [facility];  Service: Urology;  Laterality: N/A;  77 MINS  ? ? ?There were no vitals filed for this visit. ? ? Subjective Assessment - 08/02/21 1438   ? ? Subjective Doing okay   ? Currently in Pain? No/denies   ? ?  ?  ? ?  ? ? ? ? ? ? ? ? ? ? ? ? ? ? ? ? ? ? ? ? [place] Adult PT Treatment/Exercise - 08/02/21 0001   ? ?  ? Knee/Hip Exercises: Aerobic  ? Nustep Level 5 x 6 minutes   ?  ? Knee/Hip Exercises: Machines for Strengthening  ? Cybex Knee Flexion 35# 3x10   ? Other Machine seated rows and lats 35# 2x10, triceps 25#, biceps 10# all with cues to engage the core, 10# straight arm pulls for core   ?  ? Knee/Hip Exercises: Standing  ? Hip Abduction Both;2 sets;10 reps   ? Hip Extension Both;2 sets;10 reps   ?  ? Knee/Hip Exercises: Supine  ? Bridges with [employer] 20 reps   ? Bridges with [employer] 20 reps   ? Other Supine Knee/Hip Exercises feet on ball K2C, trunk rotation, small bridges and isometric abs   ?  ? Knee/Hip Exercises: Sidelying  ? Clams 5# 2x10 both sides   ? ?  ?  ? ?  ? ? ? ? ? ? ? ? ? ? ? ? PT Short Term Goals - 04/17/21 1143   ? ?  ? PT SHORT TERM GOAL #1  ? Title Pt will be I and compliant with initial HEP.   ? Status Partially Met   ? ?  ?  ? ?  ? ? ? ? PT Long Term Goals - 07/31/21 1513   ? ?  ? PT LONG TERM GOAL #1  ? Title Pt will be independent with long term HEP for improved posture.   ? Status Partially Met   ? ?  ?  ? ?  ? ? ? ? ? ? ? ? Plan - 08/02/21 1525   ? ? Clinical Impression Statement Patient hsa contacted the gym and we will be able to meet there to assure his safety on the equipment as the little place he goes to does not have trainers and is staffed on a volunteer basis.  His calves are very tight, he is having less hip/back pain, still some issues at times with the knees   ? PT Next Visit Plan will  decrease to 1x/week, try to set up gym activities   ?  Consulted and Agree with Plan of Care Patient   ? ?  ?  ? ?  ? ? ?Patient will benefit from skilled therapeutic intervention in order to improve the following deficits and impairments:  Decreased coordination, Decreased range of motion, Difficulty walking, Decreased endurance, Cardiopulmonary status limiting activity, Decreased activity tolerance, Decreased strength, Decreased mobility, Decreased balance ? ?Visit Diagnosis: ?Muscle weakness (generalized) ? ?Difficulty in walking, not elsewhere classified ? ?Unsteadiness on feet ? ?Cervicalgia ? ? ? ? ?Problem List ?Patient Active Problem List  ? Diagnosis Date Noted  ? Secondary hypercoagulable state ([place]) 03/28/2020  ? Malignant neoplasm of prostate ([place]) 09/11/2018  ? Enlarged prostate with urinary obstruction 07/06/2018  ? Gastrocnemius strain, right, initial encounter 12/10/2017  ? Baker's cyst of knee, right 12/10/2017  ? Obesity 03/24/2017  ? Paroxysmal atrial fibrillation ([place]) 12/05/2016  ? Near syncope 04/20/2016  ? Leg hematoma, left, initial encounter 04/20/2016  ? Bleeding   ? Ecchymosis   ? Left hamstring muscle strain   ? Muscle tear   ? RBBB 08/30/2015  ? Chronic systolic dysfunction of left ventricle 07/11/2014  ? OSA (obstructive sleep apnea) 04/07/2013  ? Multinodular goiter 01/20/2013  ? Cervical spondylosis without myelopathy 01/18/2013  ? Atrial fibrillation with RVR ([place]) 10/27/2012  ? Left ventricular dysfunction 12/17/2011  ? Low back pain 09/11/2011  ? Fatigue 05/22/2011  ? PVC (premature ventricular contraction) 01/18/2011  ? Persistent atrial fibrillation ([place]) 01/12/2011  ? Hyperlipidemia 08/02/2010  ? Hypertension 08/02/2010  ? Osteoarthritis 08/02/2010  ? BPH (benign prostatic hyperplasia) 08/02/2010  ? GERD (gastroesophageal reflux disease) 08/02/2010  ? h/o Atrial flutter [facility]) s/p ablation 2012   ? ? Sumner Boast, PT ?08/02/2021, 4:10 PM ? ?[place] ?[place] ?[address] ?[place], [place], [address] ?Phone: [phone]   Fax:  [phone] ? ?Name: Connor [name] ?MRN: [id number] ?Date of Birth: 08-01-1933 ? ? ? ?

## 2021-08-07 ENCOUNTER — Ambulatory Visit: Payer: Medicare PPO | Admitting: Physical Therapy

## 2021-08-09 ENCOUNTER — Encounter: Payer: Self-pay | Admitting: Physical Therapy

## 2021-08-09 ENCOUNTER — Ambulatory Visit: Payer: Medicare PPO | Attending: Family Medicine | Admitting: Physical Therapy

## 2021-08-09 DIAGNOSIS — M542 Cervicalgia: Secondary | ICD-10-CM | POA: Insufficient documentation

## 2021-08-09 DIAGNOSIS — R2681 Unsteadiness on feet: Secondary | ICD-10-CM | POA: Insufficient documentation

## 2021-08-09 DIAGNOSIS — R262 Difficulty in walking, not elsewhere classified: Secondary | ICD-10-CM | POA: Insufficient documentation

## 2021-08-09 DIAGNOSIS — M6281 Muscle weakness (generalized): Secondary | ICD-10-CM | POA: Insufficient documentation

## 2021-08-09 NOTE — Therapy (Signed)
[place] ?[place] [address] ?[place], [place], [address] ?Phone: [phone]   Fax:  [phone] ? ?Physical Therapy Treatment ? ?Patient Details  ?Name: Connor [name] ?MRN: [id number] ?Date of Birth: [birth year] ?Referring Provider (PT): Copland ? ? ?Encounter Date: 08/09/2021 ? ? PT End of Session - 08/09/21 1518   ? ? Visit Number 27   ? Date for PT Re-Evaluation 10/11/21   ? Authorization Type Hum[employer]   ? Authorization Time Period 4/10   ? PT Start Time 1440   ? PT Stop Time 1525   ? PT Time Calculation (min) 45 min   ? Activity Tolerance Patient tolerated treatment well   ? Behavior During Therapy [facility] for tasks assessed/performed   ? ?  ?  ? ?  ? ? ?Past Medical History:  ?Diagnosis Date  ? Benign localized prostatic hyperplasia with lower urinary tract symptoms (LUTS)   ? urologist-- dr Diona Fanti  ? Chronic anticoagulation   ? Xarelto for afib  ? Chronic constipation   ? Chronic cystitis   ? Gross hematuria   ? History of DVT of lower extremity 06/08/2010  ? right femoral dvt dx post op total hip 05-08-2010  ? History of gastric ulcer   ? remote  ? History of kidney stones   ? one stone. remote hx  ? Hyperlipidemia   ? Hypertension   ? Dx by Dr. Lance Muss around age  14   ? NICM (nonischemic cardiomyopathy) ([place])   ? per echo 06-11-2017,  ef 45-50% with diffuse hypokinesis,  G1DD  ? OSA on CPAP 2017  ? compliant with CPAP.  managed by Dr Elsworth Soho.  ? Osteoarthritis   ? Other urethral stricture, male, meatal   ? chronic;  pt self dilates  ? Paroxysmal atrial fibrillation [facility]) cardiologist-- dr Rayann Heman  ? first dx 2009/   a. documented by Dr Barrett Shell on office EKG 9/12. b. maintained on Tikosyn and Xarelto. c. s/p DCCV 's.  d.  x3  EP w/ ablation atrial fib last one 12-05-2016  ? Prediabetes   ? Premature ventricular contraction   ? PVC's (premature ventricular contractions)   ? RA (rheumatoid arthritis) ([place])   ? rheumatologist--- Dr Page Spiro,  treated with Humira   ? RBBB (right bundle branch block)   ? Hx of a fib, ablation Aug 2018  ? Rheumatoid arthritis ([place])   ? Vitamin D deficiency   ? Wears hearing aid in both ears   ? ? ?Past Surgical History:  ?Procedure Laterality Date  ? ABLATION OF DYSRHYTHMIC FOCUS  08/29/2015  ? ATRIAL FIBRILLATION ABLATION  12/05/2016  ? ATRIAL FIBRILLATION ABLATION N/A 12/05/2016  ? Procedure: Atrial Fibrillation Ablation;  Surgeon: [name], MD;  Location: [place] CV [facility];  Service: Cardiovascular;  Laterality: N/A;  ? ATRIAL FIBRILLATION ABLATION N/A 02/29/2020  ? Procedure: ATRIAL FIBRILLATION ABLATION;  Surgeon: [name], MD;  Location: [place] CV LAB;  Service: Cardiovascular;  Laterality: N/A;  ? ATRIAL FLUTTER ABLATION  09/2010  ? by Greggory Brandy  ? BUNIONECTOMY Right 1990s  ? CARDIOVERSION N/A 10/28/2012  ? Procedure: CARDIOVERSION;  Surgeon: Burnell Blanks, MD;  Location: [place];  Service: Cardiovascular;  Laterality: N/A;  ? CATARACT EXTRACTION W/ INTRAOCULAR LENS  IMPLANT, BILATERAL  2019  ? CYSTOSCOPY WITH BIOPSY N/A 09/03/2018  ? Procedure: CYSTOSCOPY WITH FULGURATION [id number];  Surgeon: Franchot Gallo, MD;  Location: [facility];  Service: Urology;  Laterality: N/A;  30 MINS  ?  ELECTROPHYSIOLOGIC STUDY N/A 08/29/2015  ? Procedure: Atrial Fibrillation Ablation;  Surgeon: [name], MD;  Location: [place] CV LAB;  Service: Cardiovascular;  Laterality: N/A;  ? KNEE SURGERY Bilateral x3   1960s & 1970s  ? open  ? LEFT HEART CATHETERIZATION WITH CORONARY ANGIOGRAM N/A 06/07/2011  ? Procedure: LEFT HEART CATHETERIZATION WITH CORONARY ANGIOGRAM;  Surgeon: Peter M Martinique, MD;  Location: [facility] CATH LAB;  Service: Cardiovascular;  Laterality: N/A;    Normal coronary arteries,  low normal LVF  ? TONSILLECTOMY AND ADENOIDECTOMY  age 61  ? TOTAL HIP ARTHROPLASTY Right 06/04/10    dr Noemi Chapel  '@[facility]'   ? TOTAL KNEE ARTHROPLASTY Bilateral right 1995;  left 1997  ? TRANSURETHRAL RESECTION OF PROSTATE  06-29-2002   dr humphries  '@[facility]'   ?  TRANSURETHRAL RESECTION OF PROSTATE N/A 07/06/2018  ? Procedure: TRANSURETHRAL RESECTION OF THE PROSTATE (TURP);  Surgeon: Franchot Gallo, MD;  Location: [facility];  Service: Urology;  Laterality: N/A;  51 MINS  ? ? ?There were no vitals filed for this visit. ? ? Subjective Assessment - 08/09/21 1439   ? ? Subjective Tired, doing some yardwork   ? Currently in Pain? No/denies   ? ?  ?  ? ?  ? ? ? ? ? ? ? ? ? ? ? ? ? ? ? ? ? ? ? ? [place] Adult PT Treatment/Exercise - 08/09/21 0001   ? ?  ? Knee/Hip Exercises: Aerobic  ? Nustep Level 5 x 6 minutes   ? Other Aerobic UBE level 3 x 4 minutes   ?  ? Knee/Hip Exercises: Machines for Strengthening  ? Cybex Knee Flexion 35# 3x10   ? Other Machine seated rows and lats 35# 2x10, triceps 25#, biceps 10# all with cues to engage the core, 10# straight arm pulls for core   ?  ? Knee/Hip Exercises: Supine  ? Short [employer] Both;2 sets;10 reps   ? Short Arc [employer] Limitations 3#   ? Bridges with [employer] 20 reps   ? Bridges with [employer] 20 reps   ? Other Supine Knee/Hip Exercises feet on ball K2C, trunk rotation, small bridges and isometric abs   ? ?  ?  ? ?  ? ? ? ? ? ? ? ? ? ? ? ? PT Short Term Goals - 04/17/21 1143   ? ?  ? PT SHORT TERM GOAL #1  ? Title Pt will be I and compliant with initial HEP.   ? Status Partially Met   ? ?  ?  ? ?  ? ? ? ? PT Long Term Goals - 08/09/21 1530   ? ?  ? PT LONG TERM GOAL #1  ? Title Pt will be independent with long term HEP for improved posture.   ?  ? PT LONG TERM GOAL #2  ? Title Pt will walk for 30 minutes with no report of lightheadedness, as needed for daily exercise and errands.   ? Status Partially Met   ? ?  ?  ? ?  ? ? ? ? ? ? ? ? Plan - 08/09/21 1523   ? ? Clinical Impression Statement Able to set up a meeting at the gym for May 3rd, we will have one more visit after that to discuss any issues.  He is having some stiffness and pain in the knees as well as the wrists. He is very active right now with  [place]   ? PT Next Visit Plan will decrease to 1x/week, try to set up gym activities   ? Consulted and Agree with Plan of Care Patient   ? ?  ?  ? ?  ? ? ?Patient will benefit from skilled therapeutic intervention in order to improve the following deficits and impairments:  Decreased coordination, Decreased range of motion, Difficulty walking, Decreased endurance, Cardiopulmonary status limiting activity, Decreased activity tolerance, Decreased strength, Decreased mobility, Decreased balance ? ?Visit Diagnosis: ?Muscle weakness (generalized) ? ?Difficulty in walking, not elsewhere classified ? ?Unsteadiness on feet ? ? ? ? ?Problem List ?Patient Active Problem List  ? Diagnosis Date Noted  ? Secondary hypercoagulable state ([place]) 03/28/2020  ? Malignant neoplasm of prostate ([place]) 09/11/2018  ? Enlarged prostate with urinary obstruction 07/06/2018  ? Gastrocnemius strain, right, initial encounter 12/10/2017  ? Baker's cyst of knee, right 12/10/2017  ? Obesity 03/24/2017  ? Paroxysmal atrial fibrillation ([place]) 12/05/2016  ? Near syncope 04/20/2016  ? Leg hematoma, left, initial encounter 04/20/2016  ? Bleeding   ? Ecchymosis   ? Left hamstring muscle strain   ? Muscle tear   ? RBBB 08/30/2015  ? Chronic systolic dysfunction of left ventricle 07/11/2014  ? OSA (obstructive sleep apnea) 04/07/2013  ? Multinodular goiter 01/20/2013  ? Cervical spondylosis without myelopathy 01/18/2013  ? Atrial fibrillation with RVR ([place]) 10/27/2012  ? Left ventricular dysfunction 12/17/2011  ? Low back pain 09/11/2011  ? Fatigue 05/22/2011  ? PVC (premature ventricular contraction) 01/18/2011  ? Persistent atrial fibrillation ([place]) 01/12/2011  ? Hyperlipidemia 08/02/2010  ? Hypertension 08/02/2010  ? Osteoarthritis 08/02/2010  ? BPH (benign prostatic hyperplasia) 08/02/2010  ? GERD (gastroesophageal reflux disease) 08/02/2010  ? h/o Atrial flutter [facility]) s/p ablation 2012   ? ? Sumner Boast,  PT ?08/09/2021, 3:30 PM ? ?[place] ?[address] ?[address] ?[place], [place], [address] ?Phone: [phone]   Fax:  [phone] ? ?Name: Connor [name] ?MRN: [id number] ?

## 2021-08-12 ENCOUNTER — Encounter: Payer: Self-pay | Admitting: Family Medicine

## 2021-08-13 ENCOUNTER — Ambulatory Visit: Payer: Medicare PPO | Admitting: Family Medicine

## 2021-08-13 ENCOUNTER — Encounter: Payer: Self-pay | Admitting: Family Medicine

## 2021-08-13 VITALS — BP 138/82 | HR 98 | Temp 98.0°F | Ht 72.0 in | Wt 226.4 lb

## 2021-08-13 DIAGNOSIS — R350 Frequency of micturition: Secondary | ICD-10-CM | POA: Diagnosis not present

## 2021-08-13 LAB — URINALYSIS, ROUTINE W REFLEX MICROSCOPIC

## 2021-08-13 MED ORDER — CEPHALEXIN 500 MG PO CAPS: 500.0000 mg | ORAL_CAPSULE | Freq: Four times a day (QID) | ORAL | 0 refills | Status: AC

## 2021-08-13 NOTE — Patient Instructions (Signed)
Stay hydrated.   Warning signs/symptoms: Uncontrollable nausea/vomiting, fevers, worsening symptoms despite treatment, confusion.  Give us around 2 business days to get culture back to you.  Let us know if you need anything. 

## 2021-08-13 NOTE — Progress Notes (Signed)
Chief Complaint  ?Patient presents with  ? Urinary Frequency  ? ? ?Connor [name] is a 88 y.o. male here for possible UTI. ? ?Duration: 4 days. ?Symptoms: Urgency, urinary frequency ?Denies: hematuria, urinary hesitancy, urinary retention, fever, flank pain on bilateral sides, nausea, and vomiting, vaginal discharge ?Hx of recurrent UTI? No, but has had them in the past ?Took AZO that did help some.  ? ?Past Medical History:  ?Diagnosis Date  ? Benign localized prostatic hyperplasia with lower urinary tract symptoms (LUTS)   ? urologist-- dr Diona Fanti  ? Chronic anticoagulation   ? Xarelto for afib  ? Chronic constipation   ? Chronic cystitis   ? Gross hematuria   ? History of DVT of lower extremity 06/08/2010  ? right femoral dvt dx post op total hip 05-08-2010  ? History of gastric ulcer   ? remote  ? History of kidney stones   ? one stone. remote hx  ? Hyperlipidemia   ? Hypertension   ? Dx by Dr. Lance Muss around age  53   ? NICM (nonischemic cardiomyopathy) ([place])   ? per echo 06-11-2017,  ef 45-50% with diffuse hypokinesis,  G1DD  ? OSA on CPAP 2017  ? compliant with CPAP.  managed by Dr Elsworth Soho.  ? Osteoarthritis   ? Other urethral stricture, male, meatal   ? chronic;  pt self dilates  ? Paroxysmal atrial fibrillation [facility]) cardiologist-- dr Rayann Heman  ? first dx 2009/   a. documented by Dr Barrett Shell on office EKG 9/12. b. maintained on Tikosyn and Xarelto. c. s/p DCCV 's.  d.  x3  EP w/ ablation atrial fib last one 12-05-2016  ? Prediabetes   ? Premature ventricular contraction   ? PVC's (premature ventricular contractions)   ? RA (rheumatoid arthritis) ([place])   ? rheumatologist--- Dr Page Spiro,  treated with Humira  ? RBBB (right bundle branch block)   ? Hx of a fib, ablation Aug 2018  ? Rheumatoid arthritis ([place])   ? Vitamin D deficiency   ? Wears hearing aid in both ears   ?  ? ?BP 138/82   Pulse 98   Temp 98 ?F (36.7 ?C) (Oral)   Ht 6' (1.829 m)   Wt 226 lb 6 oz (102.7 kg)   SpO2 97%   BMI 30.70 kg/m?   ?General: Awake, alert, appears stated age ?Heart: RRR ?Lungs: CTAB, normal respiratory effort, no accessory muscle usage ?Abd: BS+, soft, +discomfort over suprapubic region w palpation, ND, no masses or organomegaly ?MSK: No CVA tenderness, neg Lloyd's sign ?Psych: Age appropriate judgment and insight ? ?Frequent urination - Plan: Urinalysis, Urine Culture, cephALEXin (KEFLEX) 500 MG capsule ? ?Empirically tx w 7 d Keflex as he has hx of UTI's. Will ck Cx. Stay hydrated. Seek immediate care if pt starts to develop fevers, new/worsening symptoms, uncontrollable N/V. ?F/u prn. ?The patient voiced understanding and agreement to the plan. ? ?Shelda Pal, DO ?08/13/21 ?10:00 AM ? ?

## 2021-08-15 ENCOUNTER — Ambulatory Visit: Payer: Medicare PPO | Admitting: Physical Therapy

## 2021-08-15 ENCOUNTER — Encounter: Payer: Self-pay | Admitting: Physical Therapy

## 2021-08-15 DIAGNOSIS — M542 Cervicalgia: Secondary | ICD-10-CM | POA: Diagnosis not present

## 2021-08-15 DIAGNOSIS — R262 Difficulty in walking, not elsewhere classified: Secondary | ICD-10-CM | POA: Diagnosis not present

## 2021-08-15 DIAGNOSIS — M6281 Muscle weakness (generalized): Secondary | ICD-10-CM | POA: Diagnosis not present

## 2021-08-15 DIAGNOSIS — R2681 Unsteadiness on feet: Secondary | ICD-10-CM

## 2021-08-15 NOTE — Therapy (Signed)
[facility] ?[address] ?[place], [place], [address] ?Phone: [phone]   Fax:  [phone] ? ?Physical Therapy Treatment ? ?Patient Details  ?Name: Connor [name] ?MRN: [id number] ?Date of Birth: 03/18/1934 ?Referring Provider (PT): Copland ? ? ?Encounter Date: 08/15/2021 ? ? PT End of Session - 08/15/21 1515   ? ? Visit Number 28   ? Date for PT Re-Evaluation 10/11/21   ? Authorization Type [employer]   ? Authorization Time Period 5/10   ? PT Start Time 1430   ? PT Stop Time 1515   ? PT Time Calculation (min) 45 min   ? Activity Tolerance Patient tolerated treatment well   ? Behavior During Therapy W[facility] for tasks assessed/performed   ? ?  ?  ? ?  ? ? ?Past Medical History:  ?Diagnosis Date  ? Benign localized prostatic hyperplasia with lower urinary tract symptoms (LUTS)   ? urologist-- dr dahlstedt  ? Chronic anticoagulation   ? Xarelto for afib  ? Chronic constipation   ? Chronic cystitis   ? Gross hematuria   ? History of DVT of lower extremity 06/08/2010  ? right femoral dvt dx post op total hip 05-08-2010  ? History of gastric ulcer   ? remote  ? History of kidney stones   ? one stone. remote hx  ? Hyperlipidemia   ? Hypertension   ? Dx by Dr. Ajay Kuamr around age  52   ? NICM (nonischemic cardiomyopathy) (HCC)   ? per echo 06-11-2017,  ef 45-50% with diffuse hypokinesis,  G1DD  ? OSA on CPAP 2017  ? compliant with CPAP.  managed by Dr Alva.  ? Osteoarthritis   ? Other urethral stricture, male, meatal   ? chronic;  pt self dilates  ? Paroxysmal atrial fibrillation (HCC) cardiologist-- dr allred  ? first dx 2009/   a. documented by Dr Jodan on office EKG 9/12. b. maintained on Tikosyn and Xarelto. c. s/p DCCV 's.  d.  x3  EP w/ ablation atrial fib last one 12-05-2016  ? Prediabetes   ? Premature ventricular contraction   ? PVC's (premature ventricular contractions)   ? RA (rheumatoid arthritis) (HCC)   ? rheumatologist--- Dr A.. Hawkes,  treated with Humira   ? RBBB (right bundle branch block)   ? Hx of a fib, ablation Aug 2018  ? Rheumatoid arthritis (HCC)   ? Vitamin D deficiency   ? Wears hearing aid in both ears   ? ? ?Past Surgical History:  ?Procedure Laterality Date  ? ABLATION OF DYSRHYTHMIC FOCUS  08/29/2015  ? ATRIAL FIBRILLATION ABLATION  12/05/2016  ? ATRIAL FIBRILLATION ABLATION N/A 12/05/2016  ? Procedure: Atrial Fibrillation Ablation;  Surgeon: Allred, Eastyn, MD;  [facility];  Service: Cardiovascular;  Laterality: N/A;  ? ATRIAL FIBRILLATION ABLATION N/A 02/29/2020  ? Procedure: ATRIAL FIBRILLATION ABLATION;  Surgeon: Allred, Rosaire, MD;  [facility]: [facility];  Service: Cardiovascular;  Laterality: N/A;  ? ATRIAL FLUTTER ABLATION  09/2010  ? by JA  ? BUNIONECTOMY Right 1990s  ? CARDIOVERSION N/A 10/28/2012  ? Procedure: CARDIOVERSION;  Surgeon: Christopher D McAlhany, MD;  Location: [facility];  Service: Cardiovascular;  Laterality: N/A;  ? CATARACT EXTRACTION W/ INTRAOCULAR LENS  IMPLANT, BILATERAL  2019  ? CYSTOSCOPY WITH BIOPSY N/A 09/03/2018  ? Procedure: CYSTOSCOPY WITH FULGURATION OFURETHRA;  Surgeon: [name], Stephen, MD;  Location: [facility];  Service: Urology;  Laterality: N/A;  30 MINS  ?   ELECTROPHYSIOLOGIC STUDY N/A 08/29/2015  ? Procedure: Atrial Fibrillation Ablation;  Surgeon: Cardarius Allred, MD;  [facility];  Service: Cardiovascular;  Laterality: N/A;  ? KNEE SURGERY Bilateral x3   1960s & 1970s  ? open  ? LEFT HEART CATHETERIZATION WITH CORONARY ANGIOGRAM N/A 06/07/2011  ? Procedure: LEFT HEART CATHETERIZATION WITH CORONARY ANGIOGRAM;  Surgeon: Peter M Jordan, MD;  Location: [facility];  Service: Cardiovascular;  Laterality: N/A;    Normal coronary arteries,  low normal LVF  ? TONSILLECTOMY AND ADENOIDECTOMY  age 9  ? TOTAL HIP ARTHROPLASTY Right 06/04/10    dr wainer  @[facility]  ? TOTAL KNEE ARTHROPLASTY Bilateral right 1995;  left 1997  ? TRANSURETHRAL RESECTION OF PROSTATE  06-29-2002   dr humphries  @[facility]  ?  TRANSURETHRAL RESECTION OF PROSTATE N/A 07/06/2018  ? Procedure: TRANSURETHRAL RESECTION OF THE PROSTATE (TURP);  Surgeon: [name], Stephen, MD;  Location: [facility];  Service: Urology;  Laterality: N/A;  45 MINS  ? ? ?There were no vitals filed for this visit. ? ? Subjective Assessment - 08/15/21 1438   ? ? Subjective I am getting ready for a trip and company, I am tired, I feel like sometimes my knees are on the edge of "too much"   ? Currently in Pain? Yes   ? Pain Score 2    ? Pain Location Knee   ? Pain Orientation Right;Left   ? Pain Descriptors / Indicators Sore   ? Aggravating Factors  walking, activity   ? ?  ?  ? ?  ? ? ? ? ? ? ? ? ? ? ? ? ? ? ? ? ? ? ? ? [facility] Adult PT Treatment/Exercise - 08/15/21 0001   ? ?  ? Knee/Hip Exercises: Stretches  ? Passive Hamstring Stretch Both;5 reps;10 seconds   ? Knee: Self-Stretch to increase Flexion Both;3 reps;10 seconds   ? Piriformis Stretch Both;3 reps;20 seconds   ? Gastroc Stretch Both;3 reps;20 seconds   ?  ? Knee/Hip Exercises: Aerobic  ? Nustep Level 5 x 6 minutes   ? Other Aerobic UBE level 3 x 4 minutes   ?  ? Knee/Hip Exercises: Machines for Strengthening  ? Other Machine seated rows and lats 35# 2x10, triceps 25#, biceps 10# all with cues to engage the core, 10# straight arm pulls for core   ?  ? Knee/Hip Exercises: Seated  ? Long Arc Quad Both;2 sets;10 reps   ? Long Arc Quad Weight 3 lbs.   ?  ? Knee/Hip Exercises: Supine  ? Bridges with Ball Squeeze 20 reps   ? Bridges with Clamshell 20 reps   ? Other Supine Knee/Hip Exercises feet on ball K2C, trunk rotation, small bridges and isometric abs   ? ?  ?  ? ?  ? ? ? ? ? ? ? ? ? ? ? ? PT Short Term Goals - 04/17/21 1143   ? ?  ? PT SHORT TERM GOAL #1  ? Title Pt will be I and compliant with initial HEP.   ? Status Partially Met   ? ?  ?  ? ?  ? ? ? ? PT Long Term Goals - 08/15/21 1517   ? ?  ? PT LONG TERM GOAL #1  ? Title Pt will be independent with long term HEP for improved posture.   ?  Status Partially Met   ?  ? PT LONG TERM GOAL #2  ? Title Pt will   walk for 30 minutes with no report of lightheadedness, as needed for daily exercise and errands.   ? Status Partially Met   ?  ? PT LONG TERM GOAL #3  ? Title decrease TUG to 12 seconds   ? Status Partially Met   ? ?  ?  ? ?  ? ? ? ? ? ? ? ? Plan - 08/15/21 1516   ? ? Clinical Impression Statement Patient has been very busy as he is travelling to the beach and having company so he has been doing yard and hosuework, he is hurting a little more today in the knees, i elected to go a little easier and went back to stretches today   ? PT Next Visit Plan will resume activity after the next week and then hopefully will meet at the gym on May 3rd   ? Consulted and Agree with Plan of Care Patient   ? ?  ?  ? ?  ? ? ?Patient will benefit from skilled therapeutic intervention in order to improve the following deficits and impairments:  Decreased coordination, Decreased range of motion, Difficulty walking, Decreased endurance, Cardiopulmonary status limiting activity, Decreased activity tolerance, Decreased strength, Decreased mobility, Decreased balance ? ?Visit Diagnosis: ?Muscle weakness (generalized) ? ?Difficulty in walking, not elsewhere classified ? ?Unsteadiness on feet ? ? ? ? ?Problem List ?Patient Active Problem List  ? Diagnosis Date Noted  ? Secondary hypercoagulable state (HCC) 03/28/2020  ? Malignant neoplasm of prostate (HCC) 09/11/2018  ? Enlarged prostate with urinary obstruction 07/06/2018  ? Gastrocnemius strain, right, initial encounter 12/10/2017  ? Baker's cyst of knee, right 12/10/2017  ? Obesity 03/24/2017  ? Paroxysmal atrial fibrillation (HCC) 12/05/2016  ? Near syncope 04/20/2016  ? Leg hematoma, left, initial encounter 04/20/2016  ? Bleeding   ? Ecchymosis   ? Left hamstring muscle strain   ? Muscle tear   ? RBBB 08/30/2015  ? Chronic systolic dysfunction of left ventricle 07/11/2014  ? OSA (obstructive sleep apnea) 04/07/2013  ?  Multinodular goiter 01/20/2013  ? Cervical spondylosis without myelopathy 01/18/2013  ? Atrial fibrillation with RVR (HCC) 10/27/2012  ? Left ventricular dysfunction 12/17/2011  ? Low back pain 09/11/2011  ? Fat

## 2021-08-16 LAB — URINE CULTURE
MICRO NUMBER:: 13241981
SPECIMEN QUALITY:: ADEQUATE

## 2021-08-22 ENCOUNTER — Encounter: Payer: Self-pay | Admitting: Physical Therapy

## 2021-08-22 ENCOUNTER — Ambulatory Visit: Payer: Medicare PPO | Admitting: Physical Therapy

## 2021-08-22 DIAGNOSIS — M542 Cervicalgia: Secondary | ICD-10-CM | POA: Diagnosis not present

## 2021-08-22 DIAGNOSIS — R2681 Unsteadiness on feet: Secondary | ICD-10-CM | POA: Diagnosis not present

## 2021-08-22 DIAGNOSIS — R262 Difficulty in walking, not elsewhere classified: Secondary | ICD-10-CM | POA: Diagnosis not present

## 2021-08-22 DIAGNOSIS — M6281 Muscle weakness (generalized): Secondary | ICD-10-CM | POA: Diagnosis not present

## 2021-08-22 NOTE — Therapy (Signed)
[facility] ?[place], [place], [address] ?Phone: [phone]   Fax:  [phone] ? ?Physical Therapy Treatment ? ?Patient Details  ?Name: Connor [name] ?MRN: [id number] ?Date of Birth: [birth year] ?Referring Provider (PT): Copland ? ? ?Encounter Date: 08/22/2021 ? ? PT End of Session - 08/22/21 1145   ? ? Visit Number 29   ? Date for PT Re-Evaluation 10/11/21   ? Authorization Type [employer]   ? Authorization Time Period 6/10   ? PT Start Time 1055   ? PT Stop Time 1140   ? PT Time Calculation (min) 45 min   ? Activity Tolerance Patient tolerated treatment well   ? Behavior During Therapy [facility] for tasks assessed/performed   ? ?  ?  ? ?  ? ? ?Past Medical History:  ?Diagnosis Date  ? Benign localized prostatic hyperplasia with lower urinary tract symptoms (LUTS)   ? urologist-- dr Diona Fanti  ? Chronic anticoagulation   ? Xarelto for afib  ? Chronic constipation   ? Chronic cystitis   ? Gross hematuria   ? History of DVT of lower extremity 06/08/2010  ? right femoral dvt dx post op total hip 05-08-2010  ? History of gastric ulcer   ? remote  ? History of kidney stones   ? one stone. remote hx  ? Hyperlipidemia   ? Hypertension   ? Dx by Dr. Lance Muss around age  15   ? NICM (nonischemic cardiomyopathy) ([place])   ? per echo 06-11-2017,  ef 45-50% with diffuse hypokinesis,  G1DD  ? OSA on CPAP 2017  ? compliant with CPAP.  managed by Dr Elsworth Soho.  ? Osteoarthritis   ? Other urethral stricture, male, meatal   ? chronic;  pt self dilates  ? Paroxysmal atrial fibrillation [facility]) cardiologist-- dr Rayann Heman  ? first dx 2009/   a. documented by Dr Barrett Shell on office EKG 9/12. b. maintained on Tikosyn and Xarelto. c. s/p DCCV 's.  d.  x3  EP w/ ablation atrial fib last one 12-05-2016  ? Prediabetes   ? Premature ventricular contraction   ? PVC's (premature ventricular contractions)   ? RA (rheumatoid arthritis) ([place])   ? rheumatologist--- Dr Page Spiro,  treated with Humira   ? RBBB (right bundle branch block)   ? Hx of a fib, ablation Aug 2018  ? Rheumatoid arthritis ([place])   ? Vitamin D deficiency   ? Wears hearing aid in both ears   ? ? ?Past Surgical History:  ?Procedure Laterality Date  ? ABLATION OF DYSRHYTHMIC FOCUS  08/29/2015  ? ATRIAL FIBRILLATION ABLATION  12/05/2016  ? ATRIAL FIBRILLATION ABLATION N/A 12/05/2016  ? Procedure: Atrial Fibrillation Ablation;  Surgeon: [name], MD;  Location: [facility];  Service: Cardiovascular;  Laterality: N/A;  ? ATRIAL FIBRILLATION ABLATION N/A 02/29/2020  ? Procedure: ATRIAL FIBRILLATION ABLATION;  Surgeon: [name], MD;  Location: [facility];  Service: Cardiovascular;  Laterality: N/A;  ? ATRIAL FLUTTER ABLATION  09/2010  ? by Greggory Brandy  ? BUNIONECTOMY Right 1990s  ? CARDIOVERSION N/A 10/28/2012  ? Procedure: CARDIOVERSION;  Surgeon: Burnell Blanks, MD;  Location: [facility];  Service: Cardiovascular;  Laterality: N/A;  ? CATARACT EXTRACTION W/ INTRAOCULAR LENS  IMPLANT, BILATERAL  2019  ? CYSTOSCOPY WITH BIOPSY N/A 09/03/2018  ? Procedure: CYSTOSCOPY WITH FULGURATION [id number];  Surgeon: Franchot Gallo, MD;  Location: [facility];  Service: Urology;  Laterality: N/A;  30 MINS  ?  ELECTROPHYSIOLOGIC STUDY N/A 08/29/2015  ? Procedure: Atrial Fibrillation Ablation;  Surgeon: [name], MD;  Location: [facility];  Service: Cardiovascular;  Laterality: N/A;  ? KNEE SURGERY Bilateral x3   1960s & 1970s  ? open  ? LEFT HEART CATHETERIZATION WITH CORONARY ANGIOGRAM N/A 06/07/2011  ? Procedure: LEFT HEART CATHETERIZATION WITH CORONARY ANGIOGRAM;  Surgeon: Peter M Martinique, MD;  Location: [facility];  Service: Cardiovascular;  Laterality: N/A;    Normal coronary arteries,  low normal LVF  ? TONSILLECTOMY AND ADENOIDECTOMY  age 14  ? TOTAL HIP ARTHROPLASTY Right 06/04/10    dr Noemi Chapel  '@[facility]'   ? TOTAL KNEE ARTHROPLASTY Bilateral right 1995;  left 1997  ? TRANSURETHRAL RESECTION OF PROSTATE  06-29-2002   dr humphries  '@[facility]'   ?  TRANSURETHRAL RESECTION OF PROSTATE N/A 07/06/2018  ? Procedure: TRANSURETHRAL RESECTION OF THE PROSTATE (TURP);  Surgeon: Franchot Gallo, MD;  Location: [facility];  Service: Urology;  Laterality: N/A;  70 MINS  ? ? ?There were no vitals filed for this visit. ? ? Subjective Assessment - 08/22/21 1106   ? ? Subjective Had a very busy week, but I did okay with everything   ? Currently in Pain? No/denies   ? ?  ?  ? ?  ? ? ? ? ? ? ? ? ? ? ? ? ? ? ? ? ? ? ? ? [facility] Adult PT Treatment/Exercise - 08/22/21 0001   ? ?  ? Knee/Hip Exercises: Stretches  ? Passive Hamstring Stretch Both;5 reps;10 seconds   ? Piriformis Stretch Both;3 reps;20 seconds   ?  ? Knee/Hip Exercises: Aerobic  ? Nustep Level 5 x 7 minutes   ? Other Aerobic UBE level 3 x 4 minutes   ?  ? Knee/Hip Exercises: Machines for Strengthening  ? Cybex Knee Extension 10# 3x10, cues for TKE   ? Other Machine seated rows and lats 25# 2x10. chest press 10# 2x10, triceps 35# 2x10, straight arm pulls 10#   ?  ? Knee/Hip Exercises: Supine  ? Short Arc [employer] Both;2 sets;10 reps   ? Short Arc [employer] Limitations 3   ? Bridges with [employer] 20 reps   ? Bridges with Clamshell 20 reps   ? Other Supine Knee/Hip Exercises feet on ball K2C, trunk rotation, small bridges and isometric abs   ? ?  ?  ? ?  ? ? ? ? ? ? ? ? ? ? ? ? PT Short Term Goals - 04/17/21 1143   ? ?  ? PT SHORT TERM GOAL #1  ? Title Pt will be I and compliant with initial HEP.   ? Status Partially Met   ? ?  ?  ? ?  ? ? ? ? PT Long Term Goals - 08/22/21 1148   ? ?  ? PT LONG TERM GOAL #1  ? Title Pt will be independent with long term HEP for improved posture.   ? Status Partially Met   ?  ? PT LONG TERM GOAL #2  ? Title Pt will walk for 30 minutes with no report of lightheadedness, as needed for daily exercise and errands.   ? Status Partially Met   ?  ? PT LONG TERM GOAL #3  ? Baseline discomfort in R side of neck, discrepancy in lateral flexion   ? Status Achieved   ?  ? PT LONG  TERM GOAL #4  ? Title walk 1000 feet without difficulty during  6 minute walk test   ? Status Partially Met   ? ?  ?  ? ?  ? ? ? ? ? ? ? ? Plan - 08/22/21 1145   ? ? Clinical Impression Statement Patient had family in over the last 6 days, he has been very busy he reports holding up well, some knee pain but reports biggest issue was fatigue, he had mentioned previously having some blood work done and had very low testosterone.  He did report getting a cream recently to start that.  He reports pleased that he could do what he did, but again some pain and then fatigue.   ? PT Next Visit Plan I have set up a meeting with him at the gym to assure safe transition to gym and then will see him here to answer questions and assure he feels good about it   ? Consulted and Agree with Plan of Care Patient   ? ?  ?  ? ?  ? ? ?Patient will benefit from skilled therapeutic intervention in order to improve the following deficits and impairments:  Decreased coordination, Decreased range of motion, Difficulty walking, Decreased endurance, Cardiopulmonary status limiting activity, Decreased activity tolerance, Decreased strength, Decreased mobility, Decreased balance ? ?Visit Diagnosis: ?Muscle weakness (generalized) ? ?Difficulty in walking, not elsewhere classified ? ?Unsteadiness on feet ? ?Cervicalgia ? ? ? ? ?Problem List ?Patient Active Problem List  ? Diagnosis Date Noted  ? Secondary hypercoagulable state ([place]) 03/28/2020  ? Malignant neoplasm of prostate ([place]) 09/11/2018  ? Enlarged prostate with urinary obstruction 07/06/2018  ? Gastrocnemius strain, right, initial encounter 12/10/2017  ? Baker's cyst of knee, right 12/10/2017  ? Obesity 03/24/2017  ? Paroxysmal atrial fibrillation ([place]) 12/05/2016  ? Near syncope 04/20/2016  ? Leg hematoma, left, initial encounter 04/20/2016  ? Bleeding   ? Ecchymosis   ? Left hamstring muscle strain   ? Muscle tear   ? RBBB 08/30/2015  ? Chronic systolic dysfunction of left ventricle  07/11/2014  ? OSA (obstructive sleep apnea) 04/07/2013  ? Multinodular goiter 01/20/2013  ? Cervical spondylosis without myelopathy 01/18/2013  ? Atrial fibrillation with RVR ([place]) 10/27/2012  ? Left ventricula

## 2021-08-25 DIAGNOSIS — H353132 Nonexudative age-related macular degeneration, bilateral, intermediate dry stage: Secondary | ICD-10-CM | POA: Diagnosis not present

## 2021-08-28 DIAGNOSIS — I739 Peripheral vascular disease, unspecified: Secondary | ICD-10-CM | POA: Diagnosis not present

## 2021-08-28 DIAGNOSIS — L84 Corns and callosities: Secondary | ICD-10-CM | POA: Diagnosis not present

## 2021-08-28 DIAGNOSIS — B351 Tinea unguium: Secondary | ICD-10-CM | POA: Diagnosis not present

## 2021-08-28 DIAGNOSIS — L603 Nail dystrophy: Secondary | ICD-10-CM | POA: Diagnosis not present

## 2021-08-29 ENCOUNTER — Encounter: Payer: Self-pay | Admitting: Physical Therapy

## 2021-08-29 ENCOUNTER — Ambulatory Visit: Payer: Medicare PPO | Admitting: Physical Therapy

## 2021-08-29 DIAGNOSIS — M6281 Muscle weakness (generalized): Secondary | ICD-10-CM | POA: Diagnosis not present

## 2021-08-29 DIAGNOSIS — R2681 Unsteadiness on feet: Secondary | ICD-10-CM

## 2021-08-29 DIAGNOSIS — M542 Cervicalgia: Secondary | ICD-10-CM

## 2021-08-29 DIAGNOSIS — R262 Difficulty in walking, not elsewhere classified: Secondary | ICD-10-CM

## 2021-08-29 NOTE — Therapy (Signed)
[facility] ?[place], [place], [address] ?Phone: [phone]   Fax:  [phone] ?Progress Note ?Reporting Period 07/03/21 to 08/29/21 for visit 21-30 ? ?See note below for Objective Data and Assessment of Progress/Goals.  ? ?  ?Physical Therapy Treatment ? ?Patient Details  ?Name: Connor [name] ?MRN: [id number] ?Date of Birth: 1933/10/11 ?Referring Provider (PT): Cop[place] ? ? ?Encounter Date: 08/29/2021 ? ? PT End of Session - 08/29/21 1244   ? ? Visit Number 30   ? Date for PT Re-Evaluation 10/11/21   ? Authorization Type [employer]   ? Authorization Time Period 8/10   ? PT Start Time 1055   ? PT Stop Time 1140   ? PT Time Calculation (min) 45 min   ? Activity Tolerance Patient tolerated treatment well   ? Behavior During Therapy [facility] for tasks assessed/performed   ? ?  ?  ? ?  ? ? ?Past Medical History:  ?Diagnosis Date  ? Benign localized prostatic hyperplasia with lower urinary tract symptoms (LUTS)   ? urologist-- dr Diona Fanti  ? Chronic anticoagulation   ? Xarelto for afib  ? Chronic constipation   ? Chronic cystitis   ? Gross hematuria   ? History of DVT of lower extremity 06/08/2010  ? right femoral dvt dx post op total hip 05-08-2010  ? History of gastric ulcer   ? remote  ? History of kidney stones   ? one stone. remote hx  ? Hyperlipidemia   ? Hypertension   ? Dx by Dr. Lance Muss around age  88   ? NICM (nonischemic cardiomyopathy) ([place])   ? per echo 06-11-2017,  ef 45-50% with diffuse hypokinesis,  G1DD  ? OSA on CPAP 2017  ? compliant with CPAP.  managed by Dr Elsworth Soho.  ? Osteoarthritis   ? Other urethral stricture, male, meatal   ? chronic;  pt self dilates  ? Paroxysmal atrial fibrillation [facility]) cardiologist-- dr Rayann Heman  ? first dx 2009/   a. documented by Dr Barrett Shell on office EKG 9/12. b. maintained on Tikosyn and Xarelto. c. s/p DCCV 's.  d.  x3  EP w/ ablation atrial fib last one 12-05-2016  ? Prediabetes   ? Premature ventricular  contraction   ? PVC's (premature ventricular contractions)   ? RA (rheumatoid arthritis) ([place])   ? rheumatologist--- Dr Page Spiro,  treated with Humira  ? RBBB (right bundle branch block)   ? Hx of a fib, ablation Aug 2018  ? Rheumatoid arthritis ([place])   ? Vitamin D deficiency   ? Wears hearing aid in both ears   ? ? ?Past Surgical History:  ?Procedure Laterality Date  ? ABLATION OF DYSRHYTHMIC FOCUS  08/29/2015  ? ATRIAL FIBRILLATION ABLATION  12/05/2016  ? ATRIAL FIBRILLATION ABLATION N/A 12/05/2016  ? Procedure: Atrial Fibrillation Ablation;  Surgeon: [name], MD;  Location: [place] [facility];  Service: Cardiovascular;  Laterality: N/A;  ? ATRIAL FIBRILLATION ABLATION N/A 02/29/2020  ? Procedure: ATRIAL FIBRILLATION ABLATION;  Surgeon: [name], MD;  Location: [place];  Service: Cardiovascular;  Laterality: N/A;  ? ATRIAL FLUTTER ABLATION  09/2010  ? by Greggory Brandy  ? BUNIONECTOMY Right 1990s  ? CARDIOVERSION N/A 10/28/2012  ? Procedure: CARDIOVERSION;  Surgeon: Burnell Blanks, MD;  Location: [facility];  Service: Cardiovascular;  Laterality: N/A;  ? CATARACT EXTRACTION W/ INTRAOCULAR LENS  IMPLANT, BILATERAL  2019  ? CYSTOSCOPY WITH BIOPSY N/A 09/03/2018  ?  Procedure: CYSTOSCOPY WITH FULGURATION [id number];  Surgeon: Franchot Gallo, MD;  Location: [facility];  Service: Urology;  Laterality: N/A;  30 MINS  ? ELECTROPHYSIOLOGIC STUDY N/A 08/29/2015  ? Procedure: Atrial Fibrillation Ablation;  Surgeon: [name], MD;  Location: [facility];  Service: Cardiovascular;  Laterality: N/A;  ? KNEE SURGERY Bilateral x3   1960s & 1970s  ? open  ? LEFT HEART CATHETERIZATION WITH CORONARY ANGIOGRAM N/A 06/07/2011  ? Procedure: LEFT HEART CATHETERIZATION WITH CORONARY ANGIOGRAM;  Surgeon: Peter M Martinique, MD;  Location: [facility] C[facility] LAB;  Service: Cardiovascular;  Laterality: N/A;    Normal coronary arteries,  low normal LVF  ? TONSILLECTOMY AND ADENOIDECTOMY  age 88  ? TOTAL HIP ARTHROPLASTY Right 06/04/10    dr  Noemi Chapel  '@[facility]'   ? TOTAL KNEE ARTHROPLASTY Bilateral right 1995;  left 1997  ? TRANSURETHRAL RESECTION OF PROSTATE  06-29-2002   dr humphries  '@[facility]'   ? TRANSURETHRAL RESECTION OF PROSTATE N/A 07/06/2018  ? Procedure: TRANSURETHRAL RESECTION OF THE PROSTATE (TURP);  Surgeon: Franchot Gallo, MD;  Location: [facility];  Service: Urology;  Laterality: N/A;  35 MINS  ? ? ?There were no vitals filed for this visit. ? ? Subjective Assessment - 08/29/21 1107   ? ? Subjective been very busy, and am very tired but doing okay   ? Currently in Pain? No/denies   ? ?  ?  ? ?  ? ? ? ? ? ? ? ? ? ? ? ? ? ? ? ? ? ? ? ? [place] Adult PT Treatment/Exercise - 08/29/21 0001   ? ?  ? Ambulation/Gait  ? Gait Comments outside around the back parking island, about 3/4 of the way had to stop and rest due to fatigue in the legs, slow pace   ?  ? Knee/Hip Exercises: Aerobic  ? Nustep Level 5 x 7 minutes   ? Other Aerobic UBE level 4 x 4 minutes   ?  ? Knee/Hip Exercises: Machines for Strengthening  ? Cybex Knee Extension 10# 3x10, cues for TKE   ? Cybex Knee Flexion 35# 3x10   ? Other Machine seated rows and lats 25# 2x10. chest press 10# 2x10, triceps 35# 2x10, straight arm pulls 10#   ?  ? Knee/Hip Exercises: Supine  ? Bridges with [employer] 20 reps   ? Bridges with [employer] 20 reps   ? Other Supine Knee/Hip Exercises feet on ball K2C, trunk rotation, small bridges and isometric abs   ? ?  ?  ? ?  ? ? ? ? ? ? ? ? ? ? ? ? PT Short Term Goals - 04/17/21 1143   ? ?  ? PT SHORT TERM GOAL #1  ? Title Pt will be I and compliant with initial HEP.   ? Status Partially Met   ? ?  ?  ? ?  ? ? ? ? PT Long Term Goals - 08/29/21 1246   ? ?  ? PT LONG TERM GOAL #1  ? Title Pt will be independent with long term HEP for improved posture.   ? Status Partially Met   ? ?  ?  ? ?  ? ? ? ? ? ? ? ? Plan - 08/29/21 1244   ? ? Clinical Impression Statement Tried to start some walking again, no pain in the knees or the hips he did get very fatigued and  had sto stop and rest going around the parking island.  Overall  he has not been able to walk much lately due to his schedule and previously was having pain.   ? PT Next Visit Plan I will meet him at his gym next week and try to get him set and safe for on his own.   ? Consulted and Agree with Plan of Care Patient   ? ?  ?  ? ?  ? ? ?Patient will benefit from skilled therapeutic intervention in order to improve the following deficits and impairments:  Decreased coordination, Decreased range of motion, Difficulty walking, Decreased endurance, Cardiopulmonary status limiting activity, Decreased activity tolerance, Decreased strength, Decreased mobility, Decreased balance ? ?Visit Diagnosis: ?Muscle weakness (generalized) ? ?Difficulty in walking, not elsewhere classified ? ?Unsteadiness on feet ? ?Cervicalgia ? ? ? ? ?Problem List ?Patient Active Problem List  ? Diagnosis Date Noted  ? Secondary hypercoagulable state ([place]) 03/28/2020  ? Malignant neoplasm of prostate ([place]) 09/11/2018  ? Enlarged prostate with urinary obstruction 07/06/2018  ? Gastrocnemius strain, right, initial encounter 12/10/2017  ? Baker's cyst of knee, right 12/10/2017  ? Obesity 03/24/2017  ? Paroxysmal atrial fibrillation ([place]) 12/05/2016  ? Near syncope 04/20/2016  ? Leg hematoma, left, initial encounter 04/20/2016  ? Bleeding   ? Ecchymosis   ? Left hamstring muscle strain   ? Muscle tear   ? RBBB 08/30/2015  ? Chronic systolic dysfunction of left ventricle 07/11/2014  ? OSA (obstructive sleep apnea) 04/07/2013  ? Multinodular goiter 01/20/2013  ? Cervical spondylosis without myelopathy 01/18/2013  ? Atrial fibrillation with RVR ([place]) 10/27/2012  ? Left ventricular dysfunction 12/17/2011  ? Low back pain 09/11/2011  ? Fatigue 05/22/2011  ? PVC (premature ventricular contraction) 01/18/2011  ? Persistent atrial fibrillation ([place]) 01/12/2011  ? Hyperlipidemia 08/02/2010  ? Hypertension 08/02/2010  ? Osteoarthritis 08/02/2010  ? BPH (benign  prostatic hyperplasia) 08/02/2010  ? GERD (gastroesophageal reflux disease) 08/02/2010  ? h/o Atrial flutter [facility]) s/p ablation 2012   ? ? Sumner Boast, PT ?08/29/2021, 12:46 PM ? ?[place] ?Outpatient Re

## 2021-08-31 DIAGNOSIS — E349 Endocrine disorder, unspecified: Secondary | ICD-10-CM | POA: Diagnosis not present

## 2021-09-05 ENCOUNTER — Ambulatory Visit: Payer: Medicare PPO | Attending: Family Medicine | Admitting: Physical Therapy

## 2021-09-05 ENCOUNTER — Other Ambulatory Visit (HOSPITAL_COMMUNITY): Payer: Self-pay | Admitting: Internal Medicine

## 2021-09-05 ENCOUNTER — Encounter: Payer: Self-pay | Admitting: Physical Therapy

## 2021-09-05 DIAGNOSIS — M542 Cervicalgia: Secondary | ICD-10-CM | POA: Diagnosis not present

## 2021-09-05 DIAGNOSIS — R252 Cramp and spasm: Secondary | ICD-10-CM | POA: Diagnosis not present

## 2021-09-05 DIAGNOSIS — R262 Difficulty in walking, not elsewhere classified: Secondary | ICD-10-CM | POA: Diagnosis not present

## 2021-09-05 DIAGNOSIS — R2681 Unsteadiness on feet: Secondary | ICD-10-CM | POA: Diagnosis not present

## 2021-09-05 DIAGNOSIS — M6281 Muscle weakness (generalized): Secondary | ICD-10-CM | POA: Diagnosis not present

## 2021-09-05 NOTE — Therapy (Signed)
[facility] ?[address] ?[place], [place], [address] ?Phone: [phone]   Fax:  [phone] ? ?Physical Therapy Treatment ? ?Patient Details  ?Name: Connor [name] ?MRN: [id number] ?Date of Birth: 03/17/1934 ?Referring Provider (PT): Copland ? ? ?Encounter Date: 09/05/2021 ? ? PT End of Session - 09/05/21 1139   ? ? Visit Number 31   ? Date for PT Re-Evaluation 10/11/21   ? Authorization Type [employer]   ? Authorization Time Period 9/10   ? PT Start Time 1040   ? PT Stop Time 1131   ? PT Time Calculation (min) 51 min   ? Activity Tolerance Patient tolerated treatment well   ? Behavior During Therapy [facility] for tasks assessed/performed   ? ?  ?  ? ?  ? ? ?Past Medical History:  ?Diagnosis Date  ? Benign localized prostatic hyperplasia with lower urinary tract symptoms (LUTS)   ? urologist-- dr dahlstedt  ? Chronic anticoagulation   ? Xarelto for afib  ? Chronic constipation   ? Chronic cystitis   ? Gross hematuria   ? History of DVT of lower extremity 06/08/2010  ? right femoral dvt dx post op total hip 05-08-2010  ? History of gastric ulcer   ? remote  ? History of kidney stones   ? one stone. remote hx  ? Hyperlipidemia   ? Hypertension   ? Dx by Dr. Ajay Kuamr around age  52   ? NICM (nonischemic cardiomyopathy) ([facility])   ? per echo 06-11-2017,  ef 45-50% with diffuse hypokinesis,  G1DD  ? OSA on CPAP 2017  ? compliant with CPAP.  managed by Dr Alva.  ? Osteoarthritis   ? Other urethral stricture, male, meatal   ? chronic;  pt self dilates  ? Paroxysmal atrial fibrillation ([facility]) cardiologist-- dr allred  ? first dx 2009/   a. documented by Dr Jodan on office EKG 9/12. b. maintained on Tikosyn and Xarelto. c. s/p DCCV 's.  d.  x3  EP w/ ablation atrial fib last one 12-05-2016  ? Prediabetes   ? Premature ventricular contraction   ? PVC's (premature ventricular contractions)   ? RA (rheumatoid arthritis) ([facility])   ? rheumatologist--- Dr A.. Hawkes,  treated with Humira   ? RBBB (right bundle branch block)   ? Hx of a fib, ablation Aug 2018  ? Rheumatoid arthritis ([facility])   ? Vitamin D deficiency   ? Wears hearing aid in both ears   ? ? ?Past Surgical History:  ?Procedure Laterality Date  ? ABLATION OF DYSRHYTHMIC FOCUS  08/29/2015  ? ATRIAL FIBRILLATION ABLATION  12/05/2016  ? ATRIAL FIBRILLATION ABLATION N/A 12/05/2016  ? Procedure: Atrial Fibrillation Ablation;  Surgeon: Allred, Keylon, MD;  Location: [facility];  Service: Cardiovascular;  Laterality: N/A;  ? ATRIAL FIBRILLATION ABLATION N/A 02/29/2020  ? Procedure: ATRIAL FIBRILLATION ABLATION;  Surgeon: Allred, Trevaughn, MD;  Location: [facility];  Service: Cardiovascular;  Laterality: N/A;  ? ATRIAL FLUTTER ABLATION  09/2010  ? by JA  ? BUNIONECTOMY Right 1990s  ? CARDIOVERSION N/A 10/28/2012  ? Procedure: CARDIOVERSION;  Surgeon: Christopher D McAlhany, MD;  Location: [facility];  Service: Cardiovascular;  Laterality: N/A;  ? CATARACT EXTRACTION W/ INTRAOCULAR LENS  IMPLANT, BILATERAL  2019  ? CYSTOSCOPY WITH BIOPSY N/A 09/03/2018  ? Procedure: CYSTOSCOPY WITH FULGURATION OFURETHRA;  Surgeon: [name], Stephen, MD;  Location: [facility];  Service: Urology;  Laterality: N/A;  30 MINS  ?   ELECTROPHYSIOLOGIC STUDY N/A 08/29/2015  ? Procedure: Atrial Fibrillation Ablation;  Surgeon: Grayling Allred, MD;  Location: [facility];  Service: Cardiovascular;  Laterality: N/A;  ? KNEE SURGERY Bilateral x3   1960s & 1970s  ? open  ? LEFT HEART CATHETERIZATION WITH CORONARY ANGIOGRAM N/A 06/07/2011  ? Procedure: LEFT HEART CATHETERIZATION WITH CORONARY ANGIOGRAM;  Surgeon: Peter M Jordan, MD;  Location: [facility];  Service: Cardiovascular;  Laterality: N/A;    Normal coronary arteries,  low normal LVF  ? TONSILLECTOMY AND ADENOIDECTOMY  age 9  ? TOTAL HIP ARTHROPLASTY Right 06/04/10    dr wainer  @[facility]  ? TOTAL KNEE ARTHROPLASTY Bilateral right 1995;  left 1997  ? TRANSURETHRAL RESECTION OF PROSTATE  06-29-2002   dr humphries  @[facility]  ?  TRANSURETHRAL RESECTION OF PROSTATE N/A 07/06/2018  ? Procedure: TRANSURETHRAL RESECTION OF THE PROSTATE (TURP);  Surgeon: [name], Stephen, MD;  Location:  SURGERY CENTER;  Service: Urology;  Laterality: N/A;  45 MINS  ? ? ?There were no vitals filed for this visit. ? ? Subjective Assessment - 09/05/21 1135   ? ? Subjective I met patient at his small gym, he reports tired but no pain, he wanted to assure his safety and how to do the gym exercises and the equipment   ? ?  ?  ? ?  ? ? ? ? ? ? ? ? ? ? ? ? ? ? ? ? ? ? ? ? ? ? ? ? ? ? ? ? ? PT Education - 09/05/21 1136   ? ? Education Details Met patient at his gym, we went over all the machines that he is safe to do.  I showed him how to adjust the machines, the levels, the weights and the reps.  He had some difficulty with a few machines and a few machines I recommended him not doing, did the X-rider, biceps, abs, chest press, lats, rows, hip ab/adduction, knee extension, I cautioned him on knee extension as it is unable to decrease the ROM to decrease stress on the knees.  We tried the leg press but he could not get in the machine,m the hip machine he was able to get in but it was difficutl for him and I advised being very careful with this one just as a safety measure on the getting on and off   ? ?  ?  ? ?  ? ? ? PT Short Term Goals - 04/17/21 1143   ? ?  ? PT SHORT TERM GOAL #1  ? Title Pt will be I and compliant with initial HEP.   ? Status Partially Met   ? ?  ?  ? ?  ? ? ? ? PT Long Term Goals - 09/05/21 1142   ? ?  ? PT LONG TERM GOAL #1  ? Title Pt will be independent with long term HEP for improved posture.   ? Status Partially Met   ? ?  ?  ? ?  ? ? ? ? ? ? ? ? Plan - 09/05/21 1139   ? ? Clinical Impression Statement Patient had joined a gym and reports having difficulty doing the machines, he had brought in pix previously and I tried to demo on my mcahines and educate from the pix, he however still reported difficulty and that there is limited  staff that is there to help people.  I met him at this gym and we went over safety and use.    He had some issues and I strongly cautioned him on a few things and then asked him not to attempt a few of the machines due to fear of being unsafe and the stress on his knees   ? PT Next Visit Plan I gave him a handout of the machnies and what we did and asked him to attempt this at least once prior to returning to PT   ? Consulted and Agree with Plan of Care Patient   ? ?  ?  ? ?  ? ? ?Patient will benefit from skilled therapeutic intervention in order to improve the following deficits and impairments:  Decreased coordination, Decreased range of motion, Difficulty walking, Decreased endurance, Cardiopulmonary status limiting activity, Decreased activity tolerance, Decreased strength, Decreased mobility, Decreased balance ? ?Visit Diagnosis: ?Muscle weakness (generalized) ? ?Difficulty in walking, not elsewhere classified ? ?Unsteadiness on feet ? ?Cervicalgia ? ?Cramp and spasm ? ? ? ? ?Problem List ?Patient Active Problem List  ? Diagnosis Date Noted  ? Secondary hypercoagulable state ([facility]) 03/28/2020  ? Malignant neoplasm of prostate ([facility]) 09/11/2018  ? Enlarged prostate with urinary obstruction 07/06/2018  ? Gastrocnemius strain, right, initial encounter 12/10/2017  ? Baker's cyst of knee, right 12/10/2017  ? Obesity 03/24/2017  ? Paroxysmal atrial fibrillation ([facility]) 12/05/2016  ? Near syncope 04/20/2016  ? Leg hematoma, left, initial encounter 04/20/2016  ? Bleeding   ? Ecchymosis   ? Left hamstring muscle strain   ? Muscle tear   ? RBBB 08/30/2015  ? Chronic systolic dysfunction of left ventricle 07/11/2014  ? OSA (obstructive sleep apnea) 04/07/2013  ? Multinodular goiter 01/20/2013  ? Cervical spondylosis without myelopathy 01/18/2013  ? Atrial fibrillation with RVR ([facility]) 10/27/2012  ? Left ventricular dysfunction 12/17/2011  ? Low back pain 09/11/2011  ? Fatigue 05/22/2011  ? PVC (premature ventricular contraction)  01/18/2011  ? Persistent atrial fibrillation ([facility]) 01/12/2011  ? Hyperlipidemia 08/02/2010  ? Hypertension 08/02/2010  ? Osteoarthritis 08/02/2010  ? BPH (benign prostatic hyperplasia) 08/02/2010  ? GERD (

## 2021-09-05 NOTE — Telephone Encounter (Signed)
This is a A-Fib clinic pt, being seen for Dr. Rayann Heman. Please address ?

## 2021-09-07 DIAGNOSIS — E349 Endocrine disorder, unspecified: Secondary | ICD-10-CM | POA: Diagnosis not present

## 2021-09-07 DIAGNOSIS — R350 Frequency of micturition: Secondary | ICD-10-CM | POA: Diagnosis not present

## 2021-09-07 DIAGNOSIS — Z8546 Personal history of malignant neoplasm of prostate: Secondary | ICD-10-CM | POA: Diagnosis not present

## 2021-09-10 NOTE — Progress Notes (Addendum)
Therapist, music at [employer] ?[address], [address] ?[place], [place] [address] ?336 [phone] ?Fax [phone] ? ?Date:  09/12/2021  ? ?Name:  Connor [name]   DOB:  1934/01/06   MRN:  [id number] ? ?PCP:  Darreld Mclean, MD  ? ? ?Chief Complaint: 6 month follow up (Concerns/ questions: 1.pt is finishing PT today. 2. MRI showed and insufficiency fracture of the low back. /) ? ? ?History of Present Illness: ? ?Connor [name] is a 88 y.o. very pleasant male patient who presents with the following: ? ?Patient seen today for periodic follow-up ? ?History of atrial fibrillation, left ventricle dysfunction, prostate cancer, seronegative rheumatoid arthritis and osteoarthritis, sleep apnea.  Has some difficulty using his CPAP and typically uses it for just an hour or 2 nightly ?He does IM humira weekly for his RA ?I had him see hematology last fall for macrocytosis-appears to be benign ?Cardiologist is Dr Rayann Heman  ?He follows up with urology for history of prostate cancer- [name].  They are managing his PSA and is rx T for him. He feels better with T replacement; his level was basically 0, now up to 400  ?He notes PSA was zero.   ? ?His wife Joycelyn Schmid is going through treatment for gallbladder cancer- she seems to be doing ok, I am actually seeing her this afternoon ? ?Most recent visit with myself was in November-that time we had him try holding his amlodipine due to borderline low blood pressure and lower extremity swelling.  He is not sure if stopping amlodipine helped much ?BNP was normal at last labs.  He is not bothered by this chronic swelling, it does resolve overnight  ?He checks his BP at home sometimes - 116- 128/ 66- 70 ? ?BP Readings from Last 3 Encounters:  ?09/12/21 110/62  ?08/13/21 138/82  ?07/04/21 120/70  ? ?He was seen by my partner Dr. Nani Ravens last month for concern of UTI ? ?Seen by cardiology, Dr. Stanford Breed in March: ?1 paroxysmal atrial fibrillation-patient is status  post ablation x3 most recently in 2021.  He remains in sinus rhythm.  We will continue Toprol and Xarelto. ?2 hypertension-patient's blood pressure is controlled.  Continue present medical regimen. ?3 obstructive sleep apnea-continue CPAP. ?4 obesity-we discussed the importance of diet, exercise and weight loss. ?5 history of mild nonischemic cardiomyopathy-LV function normal on most recent echocardiogram. ?6 hyperlipidemia-continue statin. ? ?This was their establish care visit.  Dr Mare Ferrari retired  ?He has maintained SR for close to 2 years now  ? ?He is doing PT twice weekly for strength and stability- he does feel like this has helped him  ?He did an MRI of his lumbar spine per [facility]- they found a stress fracture of some sort in his lower back  ?Patient Active Problem List  ? Diagnosis Date Noted  ? Secondary hypercoagulable state ([place]) 03/28/2020  ? Malignant neoplasm of prostate ([place]) 09/11/2018  ? Enlarged prostate with urinary obstruction 07/06/2018  ? Gastrocnemius strain, right, initial encounter 12/10/2017  ? Baker's cyst of knee, right 12/10/2017  ? Obesity 03/24/2017  ? Paroxysmal atrial fibrillation ([place]) 12/05/2016  ? Near syncope 04/20/2016  ? Leg hematoma, left, initial encounter 04/20/2016  ? Bleeding   ? Ecchymosis   ? Left hamstring muscle strain   ? Muscle tear   ? RBBB 08/30/2015  ? Chronic systolic dysfunction of left ventricle 07/11/2014  ? OSA (obstructive sleep apnea) 04/07/2013  ? Multinodular goiter 01/20/2013  ?  Cervical spondylosis without myelopathy 01/18/2013  ? Atrial fibrillation with RVR ([place]) 10/27/2012  ? Left ventricular dysfunction 12/17/2011  ? Low back pain 09/11/2011  ? Fatigue 05/22/2011  ? PVC (premature ventricular contraction) 01/18/2011  ? Persistent atrial fibrillation ([place]) 01/12/2011  ? Hyperlipidemia 08/02/2010  ? Hypertension 08/02/2010  ? Osteoarthritis 08/02/2010  ? BPH (benign prostatic hyperplasia) 08/02/2010  ? GERD (gastroesophageal reflux disease) 08/02/2010   ? h/o Atrial [facility]) s/p ablation 2012   ? ? ?Past Medical History:  ?Diagnosis Date  ? Benign localized prostatic hyperplasia with lower urinary tract symptoms (LUTS)   ? urologist-- dr Diona Fanti  ? Chronic anticoagulation   ? Xarelto for afib  ? Chronic constipation   ? Chronic cystitis   ? Gross hematuria   ? History of DVT of lower extremity 06/08/2010  ? right femoral dvt dx post op total hip 05-08-2010  ? History of gastric ulcer   ? remote  ? History of kidney stones   ? one stone. remote hx  ? Hyperlipidemia   ? Hypertension   ? Dx by Dr. Lance Muss around age  44   ? NICM (nonischemic cardiomyopathy) ([place])   ? per echo 06-11-2017,  ef 45-50% with diffuse hypokinesis,  G1DD  ? OSA on CPAP 2017  ? compliant with CPAP.  managed by Dr Elsworth Soho.  ? Osteoarthritis   ? Other urethral stricture, male, meatal   ? chronic;  pt self dilates  ? Paroxysmal atrial fibrillation [facility] cardiologist-- dr Rayann Heman  ? first dx 2009/   a. documented by Dr Barrett Shell on office EKG 9/12. b. maintained on Tikosyn and Xarelto. c. s/p DCCV 's.  d.  x3  EP w/ ablation atrial fib last one 12-05-2016  ? Prediabetes   ? Premature ventricular contraction   ? PVC's (premature ventricular contractions)   ? RA (rheumatoid arthritis) ([place])   ? rheumatologist--- Dr Page Spiro,  treated with Humira  ? RBBB (right bundle branch block)   ? Hx of a fib, ablation Aug 2018  ? Rheumatoid arthritis ([place])   ? Vitamin D deficiency   ? Wears hearing aid in both ears   ? ? ?Past Surgical History:  ?Procedure Laterality Date  ? ABLATION OF DYSRHYTHMIC FOCUS  08/29/2015  ? ATRIAL FIBRILLATION ABLATION  12/05/2016  ? ATRIAL FIBRILLATION ABLATION N/A 12/05/2016  ? Procedure: Atrial Fibrillation Ablation;  Surgeon: [name], MD;  Location: [place] [facility];  Service: Cardiovascular;  Laterality: N/A;  ? ATRIAL FIBRILLATION ABLATION N/A 02/29/2020  ? Procedure: ATRIAL FIBRILLATION ABLATION;  Surgeon: [name], MD;  Location: [place] [facility];  Service:  Cardiovascular;  Laterality: N/A;  ? ATRIAL FLUTTER ABLATION  09/2010  ? by Greggory Brandy  ? BUNIONECTOMY Right 1990s  ? CARDIOVERSION N/A 10/28/2012  ? Procedure: CARDIOVERSION;  Surgeon: Burnell Blanks, MD;  Location: [place];  Service: Cardiovascular;  Laterality: N/A;  ? CATARACT EXTRACTION W/ INTRAOCULAR LENS  IMPLANT, BILATERAL  2019  ? CYSTOSCOPY WITH BIOPSY N/A 09/03/2018  ? Procedure: CYSTOSCOPY WITH FULGURATION [id number];  Surgeon: Franchot Gallo, MD;  Location: [facility];  Service: Urology;  Laterality: N/A;  30 MINS  ? ELECTROPHYSIOLOGIC STUDY N/A 08/29/2015  ? Procedure: Atrial Fibrillation Ablation;  Surgeon: [name], MD;  Location: [place] [facility];  Service: Cardiovascular;  Laterality: N/A;  ? KNEE SURGERY Bilateral x3   1960s & 1970s  ? open  ? LEFT HEART CATHETERIZATION WITH CORONARY ANGIOGRAM N/A 06/07/2011  ? Procedure: LEFT HEART CATHETERIZATION  WITH CORONARY ANGIOGRAM;  Surgeon: Peter M Martinique, MD;  Location: [facility] CATH LAB;  Service: Cardiovascular;  Laterality: N/A;    Normal coronary arteries,  low normal LVF  ? TONSILLECTOMY AND ADENOIDECTOMY  age 74  ? TOTAL HIP ARTHROPLASTY Right 06/04/10    dr Noemi Chapel  '@[facility]'$   ? TOTAL KNEE ARTHROPLASTY Bilateral right 1995;  left 1997  ? TRANSURETHRAL RESECTION OF PROSTATE  06-29-2002   dr humphries  '@[facility]'$   ? TRANSURETHRAL RESECTION OF PROSTATE N/A 07/06/2018  ? Procedure: TRANSURETHRAL RESECTION OF THE PROSTATE (TURP);  Surgeon: Franchot Gallo, MD;  Location: [facility];  Service: Urology;  Laterality: N/A;  83 MINS  ? ? ?Social History  ? ?Tobacco Use  ? Smoking status: Former  ?  Packs/day: 1.00  ?  Years: 30.00  ?  Pack years: 30.00  ?  Types: Cigarettes  ?  Quit date: 05/06/1978  ?  Years since quitting: 43.3  ? Smokeless tobacco: Never  ? Tobacco comments:  ?  Former smoker 03/14/2021  ?Vaping Use  ? Vaping Use: Never used  ?Substance Use Topics  ? Alcohol use: Yes  ?  Comment: occasionally wine and beer  ? Drug use: Never  ? ? ?Family  History  ?Problem Relation Age of Onset  ? Heart attack Father   ? Lung disease Mother   ? Arrhythmia Sister   ? Atrial fibrillation Brother   ? Diabetes Neg Hx   ? ? ?Allergies  ?Allergen Reactions  ? Peni

## 2021-09-10 NOTE — Patient Instructions (Addendum)
It was good to see you again today.  Assuming all is well please see me in about 6 months ? ?You can get a second dose of the Bivalant COVID-vaccine at your convenience ? ?Keep up your physical and mental exercise  ? ?We will get a chest film and basic labs today  ?

## 2021-09-11 ENCOUNTER — Ambulatory Visit (HOSPITAL_COMMUNITY): Payer: Medicare PPO | Admitting: Physician Assistant

## 2021-09-12 ENCOUNTER — Ambulatory Visit: Payer: Medicare PPO | Admitting: Family Medicine

## 2021-09-12 ENCOUNTER — Encounter: Payer: Self-pay | Admitting: Family Medicine

## 2021-09-12 ENCOUNTER — Ambulatory Visit (HOSPITAL_BASED_OUTPATIENT_CLINIC_OR_DEPARTMENT_OTHER)
Admission: RE | Admit: 2021-09-12 | Discharge: 2021-09-12 | Disposition: A | Discharge status: Home / Self Care | Payer: Medicare PPO | Source: Ambulatory Visit | Attending: Family Medicine | Admitting: Family Medicine

## 2021-09-12 ENCOUNTER — Encounter: Payer: Self-pay | Admitting: Physical Therapy

## 2021-09-12 ENCOUNTER — Ambulatory Visit: Payer: Medicare PPO | Admitting: Physical Therapy

## 2021-09-12 VITALS — BP 110/62 | HR 64 | Temp 97.7°F | Resp 18 | Ht 72.0 in | Wt 230.4 lb

## 2021-09-12 DIAGNOSIS — H9193 Unspecified hearing loss, bilateral: Secondary | ICD-10-CM

## 2021-09-12 DIAGNOSIS — R262 Difficulty in walking, not elsewhere classified: Secondary | ICD-10-CM | POA: Diagnosis not present

## 2021-09-12 DIAGNOSIS — J9811 Atelectasis: Secondary | ICD-10-CM

## 2021-09-12 DIAGNOSIS — R252 Cramp and spasm: Secondary | ICD-10-CM | POA: Diagnosis not present

## 2021-09-12 DIAGNOSIS — I10 Essential (primary) hypertension: Secondary | ICD-10-CM

## 2021-09-12 DIAGNOSIS — E782 Mixed hyperlipidemia: Secondary | ICD-10-CM

## 2021-09-12 DIAGNOSIS — R059 Cough, unspecified: Secondary | ICD-10-CM | POA: Diagnosis not present

## 2021-09-12 DIAGNOSIS — D7589 Other specified diseases of blood and blood-forming organs: Secondary | ICD-10-CM

## 2021-09-12 DIAGNOSIS — G4733 Obstructive sleep apnea (adult) (pediatric): Secondary | ICD-10-CM

## 2021-09-12 DIAGNOSIS — M6281 Muscle weakness (generalized): Secondary | ICD-10-CM

## 2021-09-12 DIAGNOSIS — M542 Cervicalgia: Secondary | ICD-10-CM | POA: Diagnosis not present

## 2021-09-12 DIAGNOSIS — R2681 Unsteadiness on feet: Secondary | ICD-10-CM | POA: Diagnosis not present

## 2021-09-12 DIAGNOSIS — I483 Typical atrial flutter: Secondary | ICD-10-CM

## 2021-09-12 LAB — CBC
HCT: 41.1 % (ref 39.0–52.0)
Hemoglobin: 13.4 g/dL (ref 13.0–17.0)
MCHC: 32.5 g/dL (ref 30.0–36.0)
MCV: 106.1 fl — ABNORMAL HIGH (ref 78.0–100.0)
Platelets: 129 10*3/uL — ABNORMAL LOW (ref 150.0–400.0)
RBC: 3.88 Mil/uL — ABNORMAL LOW (ref 4.22–5.81)
RDW: 14.9 % (ref 11.5–15.5)
WBC: 4.5 10*3/uL (ref 4.0–10.5)

## 2021-09-12 LAB — BASIC METABOLIC PANEL
BUN: 13 mg/dL (ref 6–23)
CO2: 29 mEq/L (ref 19–32)
Calcium: 8.6 mg/dL (ref 8.4–10.5)
Chloride: 108 mEq/L (ref 96–112)
Creatinine, Ser: 0.73 mg/dL (ref 0.40–1.50)
GFR: 82.95 mL/min (ref >60.00)
Glucose, Bld: 105 mg/dL — ABNORMAL HIGH (ref 70–99)
Potassium: 4.1 mEq/L (ref 3.5–5.1)
Sodium: 143 mEq/L (ref 135–145)

## 2021-09-12 NOTE — Therapy (Signed)
[facility] ?[place] ?[address] ?[place], [place], [address] ?Phone: [phone]   Fax:  [phone] ? ?Physical Therapy Treatment ? ?Patient Details  ?Name: Connor [name] ?MRN: [id number] ?Date of Birth: 1933-05-11 ?Referring Provider (PT): [place] ? ? ?Encounter Date: 09/12/2021 ? ? PT End of Session - 09/12/21 1442   ? ? Visit Number [id number]   ? Date for PT Re-Evaluation 10/11/21   ? Authorization Type [employer]   ? PT Start Time 3664   ? PT Stop Time 4034   ? PT Time Calculation (min) 50 min   ? Activity Tolerance Patient tolerated treatment well   ? Behavior During Therapy [facility] for tasks assessed/performed   ? ?  ?  ? ?  ? ? ?Past Medical History:  ?Diagnosis Date  ? Benign localized prostatic hyperplasia with lower urinary tract symptoms (LUTS)   ? urologist-- dr Diona Fanti  ? Chronic anticoagulation   ? Xarelto for afib  ? Chronic constipation   ? Chronic cystitis   ? Gross hematuria   ? History of DVT of lower extremity 06/08/2010  ? right femoral dvt dx post op total hip 05-08-2010  ? History of gastric ulcer   ? remote  ? History of kidney stones   ? one stone. remote hx  ? Hyperlipidemia   ? Hypertension   ? Dx by Dr. Lance Muss around age  32   ? NICM (nonischemic cardiomyopathy) ([place])   ? per echo 06-11-2017,  ef 45-50% with diffuse hypokinesis,  G1DD  ? OSA on CPAP 2017  ? compliant with CPAP.  managed by Dr Elsworth Soho.  ? Osteoarthritis   ? Other urethral stricture, male, meatal   ? chronic;  pt self dilates  ? Paroxysmal atrial fibrillation [facility]) cardiologist-- dr Rayann Heman  ? first dx 2009/   a. documented by Dr Barrett Shell on office EKG 9/12. b. maintained on Tikosyn and Xarelto. c. s/p DCCV 's.  d.  x3  EP w/ ablation atrial fib last one 12-05-2016  ? Prediabetes   ? Premature ventricular contraction   ? PVC's (premature ventricular contractions)   ? RA (rheumatoid arthritis) ([place])   ? rheumatologist--- Dr Page Spiro,  treated with Humira  ? RBBB (right bundle branch  block)   ? Hx of a fib, ablation Aug 2018  ? Rheumatoid arthritis ([place])   ? Vitamin D deficiency   ? Wears hearing aid in both ears   ? ? ?Past Surgical History:  ?Procedure Laterality Date  ? ABLATION OF DYSRHYTHMIC FOCUS  08/29/2015  ? ATRIAL FIBRILLATION ABLATION  12/05/2016  ? ATRIAL FIBRILLATION ABLATION N/A 12/05/2016  ? Procedure: Atrial Fibrillation Ablation;  Surgeon: [name], MD;  Location: [place] [facility];  Service: Cardiovascular;  Laterality: N/A;  ? ATRIAL FIBRILLATION ABLATION N/A 02/29/2020  ? Procedure: ATRIAL FIBRILLATION ABLATION;  Surgeon: [name], MD;  Location: [place] [facility];  Service: Cardiovascular;  Laterality: N/A;  ? ATRIAL FLUTTER ABLATION  09/2010  ? by Greggory Brandy  ? BUNIONECTOMY Right 1990s  ? CARDIOVERSION N/A 10/28/2012  ? Procedure: CARDIOVERSION;  Surgeon: Burnell Blanks, MD;  Location: [place];  Service: Cardiovascular;  Laterality: N/A;  ? CATARACT EXTRACTION W/ INTRAOCULAR LENS  IMPLANT, BILATERAL  2019  ? CYSTOSCOPY WITH BIOPSY N/A 09/03/2018  ? Procedure: CYSTOSCOPY WITH FULGURATION [id number];  Surgeon: Franchot Gallo, MD;  Location: [facility];  Service: Urology;  Laterality: N/A;  30 MINS  ? ELECTROPHYSIOLOGIC STUDY N/A 08/29/2015  ?  Procedure: Atrial Fibrillation Ablation;  Surgeon: [name], MD;  Location: [place] [facility];  Service: Cardiovascular;  Laterality: N/A;  ? KNEE SURGERY Bilateral x3   1960s & 1970s  ? open  ? LEFT HEART CATHETERIZATION WITH CORONARY ANGIOGRAM N/A 06/07/2011  ? Procedure: LEFT HEART CATHETERIZATION WITH CORONARY ANGIOGRAM;  Surgeon: Peter M Martinique, MD;  Location: [facility] CATH LAB;  Service: Cardiovascular;  Laterality: N/A;    Normal coronary arteries,  low normal LVF  ? TONSILLECTOMY AND ADENOIDECTOMY  age 89  ? TOTAL HIP ARTHROPLASTY Right 06/04/10    dr Noemi Chapel  _0   ? TOTAL KNEE ARTHROPLASTY Bilateral right 1995;  left 1997  ? TRANSURETHRAL RESECTION OF PROSTATE  06-29-2002   dr humphries  _1   ? TRANSURETHRAL RESECTION OF  PROSTATE N/A 07/06/2018  ? Procedure: TRANSURETHRAL RESECTION OF THE PROSTATE (TURP);  Surgeon: Franchot Gallo, MD;  Location: [facility];  Service: Urology;  Laterality: N/A;  27 MINS  ? ? ?There were no vitals filed for this visit. ? ? Subjective Assessment - 09/12/21 1357   ? ? Subjective I went to the gym and I did okay, I may have over done one exercises waiting on the next machine.   ? Currently in Pain? Yes   ? Pain Score 2    ? Pain Location Knee   ? Pain Orientation Right;Left   ? Pain Descriptors / Indicators Sore   ? ?  ?  ? ?  ? ? ? ? ? ? ? ? ? ? ? ? ? ? ? ? ? ? ? ? [place] Adult PT Treatment/Exercise - 09/12/21 0001   ? ?  ? Ambulation/Gait  ? Gait Comments outside same walk around the parking island but did it backward, was able to go further but still very difficult at the end, had to stop and rest in standing before going up a curb   ?  ? Knee/Hip Exercises: Stretches  ? Passive Hamstring Stretch Both;5 reps;10 seconds   ? Piriformis Stretch Both;3 reps;20 seconds   ?  ? Knee/Hip Exercises: Aerobic  ? Nustep Level 5 x 7 minutes   ? Other Aerobic UBE level 4 x 6 minutes   ?  ? Knee/Hip Exercises: Machines for Strengthening  ? Other Machine seated rows and lats 25# 2x10. chest press 10# 2x10, triceps 35# 2x10, straight arm pulls 10#   ? ?  ?  ? ?  ? ? ? ? ? ? ? ? ? ? PT Education - 09/12/21 1443   ? ? Education Details went over all the gym exdercises, he asked questions as he has been on his own x 2, went over them and he had pix, cautioned him on a few things.   ? Person(s) Educated Patient   ? Methods Explanation;Demonstration   ? Comprehension Verbalized understanding;Returned demonstration   ? ?  ?  ? ?  ? ? ? PT Short Term Goals - 04/17/21 1143   ? ?  ? PT SHORT TERM GOAL #1  ? Title Pt will be I and compliant with initial HEP.   ? Status Partially Met   ? ?  ?  ? ?  ? ? ? ? PT Long Term Goals - 09/12/21 1445   ? ?  ? PT LONG TERM GOAL #1  ? Title Pt will be independent with long  term HEP for improved posture.   ? Status Achieved   ?  ? PT LONG TERM GOAL #2  ?  Title Pt will walk for 30 minutes with no report of lightheadedness, as needed for daily exercise and errands.   ? Status Partially Met   ?  ? PT LONG TERM GOAL #3  ? Title decrease TUG to 12 seconds   ?  ? PT LONG TERM GOAL #4  ? Title walk 1000 feet without difficulty during 6 minute walk test   ? Status Achieved   ?  ? PT LONG TERM GOAL #5  ? Title increase berg balance score to 50/56 to decrease fall risk   ? Status Achieved   ? ?  ?  ? ?  ? ? ? ? ? ? ? ? Plan - 09/12/21 1443   ? ? Clinical Impression Statement Patient overall doing better, much less pain and no issues getting up from sitting.  He does have some knee pain at times and has diffiuclty walking distances > 300 feet due to "fatigue int he legs".  He reports that he feels good about the HEP and the gym exerciss and feels he will be able to do on his own   ? PT Next Visit Plan d/c goals met   ? Consulted and Agree with Plan of Care Patient   ? ?  ?  ? ?  ? ? ?Patient will benefit from skilled therapeutic intervention in order to improve the following deficits and impairments:  Decreased coordination, Decreased range of motion, Difficulty walking, Decreased endurance, Cardiopulmonary status limiting activity, Decreased activity tolerance, Decreased strength, Decreased mobility, Decreased balance ? ?Visit Diagnosis: ?Muscle weakness (generalized) ? ?Difficulty in walking, not elsewhere classified ? ?Unsteadiness on feet ? ? ? ? ?Problem List ?Patient Active Problem List  ? Diagnosis Date Noted  ? Secondary hypercoagulable state ([place]) 03/28/2020  ? Malignant neoplasm of prostate ([place]) 09/11/2018  ? Enlarged prostate with urinary obstruction 07/06/2018  ? Gastrocnemius strain, right, initial encounter 12/10/2017  ? Baker's cyst of knee, right 12/10/2017  ? Obesity 03/24/2017  ? Paroxysmal atrial fibrillation ([place]) 12/05/2016  ? Near syncope 04/20/2016  ? Leg hematoma, left,  initial encounter 04/20/2016  ? Bleeding   ? Ecchymosis   ? Left hamstring muscle strain   ? Muscle tear   ? RBBB 08/30/2015  ? Chronic systolic dysfunction of left ventricle 07/11/2014  ? OSA (obstructive sleep ap

## 2021-09-17 ENCOUNTER — Ambulatory Visit: Payer: Medicare PPO | Admitting: Family

## 2021-09-17 ENCOUNTER — Other Ambulatory Visit: Payer: Medicare PPO

## 2021-09-24 DIAGNOSIS — H353132 Nonexudative age-related macular degeneration, bilateral, intermediate dry stage: Secondary | ICD-10-CM | POA: Diagnosis not present

## 2021-09-28 ENCOUNTER — Inpatient Hospital Stay: Payer: Medicare PPO | Admitting: Family

## 2021-09-28 ENCOUNTER — Encounter: Payer: Self-pay | Admitting: Family

## 2021-09-28 ENCOUNTER — Inpatient Hospital Stay: Payer: Medicare PPO | Attending: Family

## 2021-09-28 ENCOUNTER — Other Ambulatory Visit: Payer: Self-pay

## 2021-09-28 VITALS — BP 116/59 | HR 64 | Temp 97.8°F | Resp 18 | Ht 72.0 in | Wt 232.4 lb

## 2021-09-28 DIAGNOSIS — R5383 Other fatigue: Secondary | ICD-10-CM | POA: Diagnosis not present

## 2021-09-28 DIAGNOSIS — D7589 Other specified diseases of blood and blood-forming organs: Secondary | ICD-10-CM | POA: Insufficient documentation

## 2021-09-28 DIAGNOSIS — E291 Testicular hypofunction: Secondary | ICD-10-CM | POA: Insufficient documentation

## 2021-09-28 LAB — CMP (CANCER CENTER ONLY)
ALT: 14 U/L (ref 0–44)
AST: 16 U/L (ref 15–41)
Albumin: 4 g/dL (ref 3.5–5.0)
Alkaline Phosphatase: 74 U/L (ref 38–126)
Anion gap: 6 (ref 5–15)
BUN: 14 mg/dL (ref 8–23)
CO2: 31 mmol/L (ref 22–32)
Calcium: 9 mg/dL (ref 8.9–10.3)
Chloride: 106 mmol/L (ref 98–111)
Creatinine: 0.79 mg/dL (ref 0.61–1.24)
GFR, Estimated: >60 mL/min (ref >60)
Glucose, Bld: 124 mg/dL — ABNORMAL HIGH (ref 70–99)
Potassium: 4 mmol/L (ref 3.5–5.1)
Sodium: 143 mmol/L (ref 135–145)
Total Bilirubin: 0.7 mg/dL (ref 0.3–1.2)
Total Protein: 6.5 g/dL (ref 6.5–8.1)

## 2021-09-28 LAB — CBC WITH DIFFERENTIAL (CANCER CENTER ONLY)
Abs Immature Granulocytes: 0.01 10*3/uL (ref 0.00–0.07)
Basophils Absolute: 0 10*3/uL (ref 0.0–0.1)
Basophils Relative: 0 %
Eosinophils Absolute: 0.2 10*3/uL (ref 0.0–0.5)
Eosinophils Relative: 3 %
HCT: 41.4 % (ref 39.0–52.0)
Hemoglobin: 13.5 g/dL (ref 13.0–17.0)
Immature Granulocytes: 0 %
Lymphocytes Relative: 19 %
Lymphs Abs: 0.9 10*3/uL (ref 0.7–4.0)
MCH: 34.8 pg — ABNORMAL HIGH (ref 26.0–34.0)
MCHC: 32.6 g/dL (ref 30.0–36.0)
MCV: 106.7 fL — ABNORMAL HIGH (ref 80.0–100.0)
Monocytes Absolute: 0.6 10*3/uL (ref 0.1–1.0)
Monocytes Relative: 12 %
Neutro Abs: 3.2 10*3/uL (ref 1.7–7.7)
Neutrophils Relative %: 66 %
Platelet Count: 133 10*3/uL — ABNORMAL LOW (ref 150–400)
RBC: 3.88 MIL/uL — ABNORMAL LOW (ref 4.22–5.81)
RDW: 13.8 % (ref 11.5–15.5)
WBC Count: 4.8 10*3/uL (ref 4.0–10.5)
nRBC: 0 % (ref 0.0–0.2)

## 2021-09-28 LAB — VITAMIN B12: Vitamin B-12: 941 pg/mL — ABNORMAL HIGH (ref 180–914)

## 2021-09-28 LAB — SAVE SMEAR(SSMR), FOR PROVIDER SLIDE REVIEW

## 2021-09-28 LAB — LACTATE DEHYDROGENASE: LDH: 188 U/L (ref 98–192)

## 2021-09-28 LAB — FOLATE: Folate: 7.3 ng/mL (ref >5.9)

## 2021-09-28 NOTE — Progress Notes (Signed)
Hematology and Oncology Follow Up Visit  Connor [name] [id number] 02-Feb-1934 88 y.o. 09/28/2021   Principle Diagnosis:  Macrocytosis with and without anemia  Mild thrombocytopenia   Current Therapy:   Observation   Interim History:  Connor [name] is here today for follow-up. He is doing well and states that he recently completed PT and has joined a rec center to continue working out with his wife.  He notes fatigue but states that he has low testosterone and recently started treatment. Hopefully this will help.  No blood loss noted. No abnormal bruising, no petechiae.  Hgb is stable at 13.5, MCV 106, platelets 133 and WBC count 4.8.  No fever, chills, n/v, cough, rash, SOB, chest pain, palpitations, abdominal pain or changes in bowel or bladder habits.  No swelling, tenderness, numbness or tingling in his extremities.  No falls or syncope.  He has maintained a good appetite and is staying well hydrated. His weight is stable at 232 lbs.   ECOG Performance Status: 1 - Symptomatic but completely ambulatory  Medications:  Allergies as of 09/28/2021       Reactions   Penicillins Itching, Swelling, Other (See Comments)   Did it involve swelling of the face/tongue/throat, SOB, or low BP? No Did it involve sudden or severe rash/hives, skin peeling, or any reaction on the inside of your mouth or nose? No Did you need to seek medical attention at a hospital or doctor's office? No When did it last happen?      30 + years If all above answers are "NO", may proceed with cephalosporin use.        Medication List        Accurate as of Sep 28, 2021 10:29 AM. If you have any questions, ask your nurse or doctor.          acetaminophen 500 MG tablet Commonly known as: TYLENOL Take 1,000-1,500 mg by mouth 2 (two) times daily as needed for moderate pain.   atorvastatin 10 MG tablet Commonly known as: LIPITOR TAKE 1 TABLET BY MOUTH EVERY DAY   calcium carbonate 500 MG chewable  tablet Commonly known as: TUMS - dosed in mg elemental calcium Chew 1 tablet by mouth daily.   CALCIUM PO Take by mouth.   clindamycin 1 % external solution Commonly known as: CLEOCIN T Apply 1 application topically daily as needed (for bumps in scalp). Apply to scalp   Cranberry 1000 MG Caps Take 2,000-3,000 mg by mouth See admin instructions. Take 20000 mg in the morning and 3000 mg in the evening   finasteride 5 MG tablet Commonly known as: PROSCAR Take 5 mg by mouth every morning.   Humira Pen 40 MG/0.4ML Pnkt Generic drug: Adalimumab Inject 40 mg as directed every 7 (seven) days. Every Tuesday   lisinopril 40 MG tablet Commonly known as: ZESTRIL TAKE 1 TABLET BY MOUTH EVERY DAY   metoprolol succinate 50 MG 24 hr tablet Commonly known as: TOPROL-XL TAKE 1/2 TABLET IN THE MORNING AND TAKE 1 TABLET IN THE EVENING   OMEGA 3 PO Take 3 capsules by mouth every evening.   omeprazole 20 MG capsule Commonly known as: PRILOSEC Take 20 mg by mouth daily.   PreserVision AREDS 2 Caps Take 1 capsule by mouth 2 (two) times a day.   rivaroxaban 20 MG Tabs tablet Commonly known as: Xarelto Take 1 tablet (20 mg total) by mouth daily with supper.   solifenacin 10 MG tablet Commonly known as: VESICARE Take 10  mg by mouth at bedtime.   STOOL SOFTENER PO Take 2 tablets by mouth at bedtime.   tamsulosin 0.4 MG Caps capsule Commonly known as: Flomax Take 1 capsule (0.4 mg total) by mouth daily after supper.   THERATEARS OP Apply 1 drop to eye daily as needed (dry eyes).   vitamin B-12 500 MCG tablet Commonly known as: CYANOCOBALAMIN Take 500 mcg by mouth every evening.   Vitamin D3 125 MCG (5000 UT) Tabs Take 5,000 Units by mouth See admin instructions. Every day        Allergies:  Allergies  Allergen Reactions   Penicillins Itching, Swelling and Other (See Comments)    Did it involve swelling of the face/tongue/throat, SOB, or low BP? No Did it involve sudden or  severe rash/hives, skin peeling, or any reaction on the inside of your mouth or nose? No Did you need to seek medical attention at a hospital or doctor's office? No When did it last happen?      30 + years If all above answers are "NO", may proceed with cephalosporin use.      Past Medical History, Surgical history, Social history, and Family History were reviewed and updated.  Review of Systems: All other 10 point review of systems is negative.   Physical Exam:  vitals were not taken for this visit.   Wt Readings from Last 3 Encounters:  09/12/21 230 lb 6.4 oz (104.5 kg)  08/13/21 226 lb 6 oz (102.7 kg)  07/04/21 228 lb (103.4 kg)    Ocular: Sclerae unicteric, pupils equal, round and reactive to light Ear-nose-throat: Oropharynx clear, dentition fair Lymphatic: No cervical or supraclavicular adenopathy Lungs no rales or rhonchi, good excursion bilaterally Heart regular rate and rhythm, no murmur appreciated Abd soft, nontender, positive bowel sounds MSK no focal spinal tenderness, no joint edema Neuro: non-focal, well-oriented, appropriate affect Breasts: Deferred   Lab Results  Component Value Date   WBC 4.8 09/28/2021   HGB 13.5 09/28/2021   HCT 41.4 09/28/2021   MCV 106.7 (H) 09/28/2021   PLT 133 (L) 09/28/2021   Lab Results  Component Value Date   FERRITIN 35.8 08/16/2019   IRON 82 11/04/2016   IRONPCTSAT 26.1 11/04/2016   Lab Results  Component Value Date   RETICCTPCT 1.6 03/19/2021   RBC 3.88 (L) 09/28/2021   No results found for: KPAFRELGTCHN, LAMBDASER, KAPLAMBRATIO No results found for: IGGSERUM, IGA, IGMSERUM No results found for: Odetta Pink, SPEI   Chemistry      Component Value Date/Time   NA 143 09/12/2021 1040   NA 145 (H) 05/10/2020 1053   K 4.1 09/12/2021 1040   CL 108 09/12/2021 1040   CO2 29 09/12/2021 1040   BUN 13 09/12/2021 1040   BUN 12 05/10/2020 1053   CREATININE 0.73  09/12/2021 1040   CREATININE 0.84 03/19/2021 0842   CREATININE 0.69 (L) 02/26/2016 1022      Component Value Date/Time   CALCIUM 8.6 09/12/2021 1040   ALKPHOS 79 03/19/2021 0842   AST 17 03/19/2021 0842   ALT 17 03/19/2021 0842   BILITOT 0.8 03/19/2021 0842       Impression and Plan: Connor [name] is a very pleasant 88 yo caucasian gentleman with greater than 10 year history of intermittent macrocytosis with and without anemia.  His counts remain stable and he is doing well at this time.  We will now follow-up as needed. He can contact our office with any questions  or concerns.  We can certainly see him again for any future heme/onc issues in the future.   Lottie Dawson, NP 5/26/202310:29 AM

## 2021-10-05 ENCOUNTER — Telehealth: Payer: Self-pay | Admitting: *Deleted

## 2021-10-05 NOTE — Telephone Encounter (Signed)
Per 09/28/24 -  Follow-up as needed.                            [place]                '20 10 7 5 3 1       [employer]  Schedules  Open Slots                      More                   Add providers to view schedules                                                                                                                         Important             Important                                                                                            Jun  2023   Su Mo Tu We Th Fr Sa  '28 29 30 31 1 2 3  4 '$ 5  $'6 7 8 9 10  11 12 13 14 15 16 17  18 19 20 21 22 23 24  25 26 27 28 29 30 1  2 3 4 5 6 7 8          'm$ Filters                                      Days/Times                       Dorene Grebe  Tue  Wed  Thu  Fri  Sat       Morning   Midday   Afternoon   Custom          Hide full days     Hide days with no template      Display visit restrictions                          Locations                                                        [place] only          All   Male   Male                               Language

## 2021-10-24 DIAGNOSIS — H353132 Nonexudative age-related macular degeneration, bilateral, intermediate dry stage: Secondary | ICD-10-CM | POA: Diagnosis not present

## 2021-10-25 DIAGNOSIS — Z79899 Other long term (current) drug therapy: Secondary | ICD-10-CM | POA: Diagnosis not present

## 2021-10-25 DIAGNOSIS — Z6831 Body mass index (BMI) 31.0-31.9, adult: Secondary | ICD-10-CM | POA: Diagnosis not present

## 2021-10-25 DIAGNOSIS — R5383 Other fatigue: Secondary | ICD-10-CM | POA: Diagnosis not present

## 2021-10-25 DIAGNOSIS — M1991 Primary osteoarthritis, unspecified site: Secondary | ICD-10-CM | POA: Diagnosis not present

## 2021-10-25 DIAGNOSIS — E669 Obesity, unspecified: Secondary | ICD-10-CM | POA: Diagnosis not present

## 2021-10-25 DIAGNOSIS — M0600 Rheumatoid arthritis without rheumatoid factor, unspecified site: Secondary | ICD-10-CM | POA: Diagnosis not present

## 2021-10-27 ENCOUNTER — Other Ambulatory Visit: Payer: Self-pay | Admitting: Family Medicine

## 2021-10-27 DIAGNOSIS — E785 Hyperlipidemia, unspecified: Secondary | ICD-10-CM

## 2021-11-08 DIAGNOSIS — R338 Other retention of urine: Secondary | ICD-10-CM | POA: Diagnosis not present

## 2021-11-08 DIAGNOSIS — R310 Gross hematuria: Secondary | ICD-10-CM | POA: Diagnosis not present

## 2021-11-13 ENCOUNTER — Encounter (HOSPITAL_BASED_OUTPATIENT_CLINIC_OR_DEPARTMENT_OTHER): Payer: Self-pay | Admitting: Emergency Medicine

## 2021-11-13 ENCOUNTER — Emergency Department (HOSPITAL_BASED_OUTPATIENT_CLINIC_OR_DEPARTMENT_OTHER)
Admission: EM | Admit: 2021-11-13 | Discharge: 2021-11-13 | Disposition: A | Discharge status: Home / Self Care | Payer: Medicare PPO | Attending: Emergency Medicine | Admitting: Emergency Medicine

## 2021-11-13 ENCOUNTER — Other Ambulatory Visit: Payer: Self-pay

## 2021-11-13 DIAGNOSIS — R319 Hematuria, unspecified: Secondary | ICD-10-CM | POA: Diagnosis not present

## 2021-11-13 DIAGNOSIS — Z7901 Long term (current) use of anticoagulants: Secondary | ICD-10-CM | POA: Diagnosis not present

## 2021-11-13 DIAGNOSIS — Z8546 Personal history of malignant neoplasm of prostate: Secondary | ICD-10-CM | POA: Insufficient documentation

## 2021-11-13 DIAGNOSIS — R310 Gross hematuria: Secondary | ICD-10-CM | POA: Diagnosis not present

## 2021-11-13 LAB — URINALYSIS, ROUTINE W REFLEX MICROSCOPIC

## 2021-11-13 LAB — URINALYSIS, MICROSCOPIC (REFLEX): RBC / HPF: >50 RBC/hpf (ref 0–5)

## 2021-11-13 NOTE — ED Triage Notes (Signed)
Pt arrives pov, steady gait c/o foley catheter not draining. Reports was draining prior to going to bed at 2300. Reports mild lower abdominal paressure

## 2021-11-13 NOTE — ED Provider Notes (Signed)
[facility] EMERGENCY DEPARTMENT Provider Note   CSN: [id number] Arrival date & time: 11/13/21  4132     History  Chief Complaint  Patient presents with   Foley Catheter Problem    Connor [name] is a 88 y.o. male.  Significant h/o prostate issues with cancer s/p radiation and multiple UTI's, most recently with hematuria and foley replaced a few days ago 2/2 UTI. Came in today as he had more blood in the bag than normal and no obvious drainage throughout the night with suprapubic pressure. Draining now, seems to feel better.      Home Medications Prior to Admission medications   Medication Sig Start Date End Date Taking? Authorizing Provider  acetaminophen (TYLENOL) 500 MG tablet Take 1,000-1,500 mg by mouth 2 (two) times daily as needed for moderate pain.     [provider]  atorvastatin (LIPITOR) 10 MG tablet TAKE 1 TABLET BY MOUTH EVERY DAY 10/29/21   Copland, Gay Filler, MD  calcium carbonate (TUMS - DOSED IN MG ELEMENTAL CALCIUM) 500 MG chewable tablet Chew 1 tablet by mouth daily.    [provider]  CALCIUM PO Take by mouth.    [provider]  Carboxymethylcellulose Sodium (THERATEARS OP) Apply 1 drop to eye daily as needed (dry eyes).    [provider]  Cholecalciferol (VITAMIN D3) 5000 UNITS TABS Take 5,000 Units by mouth See admin instructions. Every day    [provider]  clindamycin (CLEOCIN T) 1 % external solution Apply 1 application topically daily as needed (for bumps in scalp). Apply to scalp 06/23/14   [provider]  Cranberry 1000 MG CAPS Take 2,000-3,000 mg by mouth See admin instructions. Take 20000 mg in the morning and 3000 mg in the evening    [provider]  Docusate Calcium (STOOL SOFTENER PO) Take 2 tablets by mouth at bedtime.     [provider]  finasteride (PROSCAR) 5 MG tablet Take 5 mg by mouth every morning.  03/11/18   [provider]  HUMIRA PEN 40  MG/0.4ML PNKT Inject 40 mg as directed every 7 (seven) days. Every Tuesday 03/26/18   [provider]  lisinopril (ZESTRIL) 40 MG tablet TAKE 1 TABLET BY MOUTH EVERY DAY 07/02/21   Copland, Gay Filler, MD  metoprolol succinate (TOPROL-XL) 50 MG 24 hr tablet TAKE 1/2 TABLET IN THE MORNING AND TAKE 1 TABLET IN THE EVENING 09/05/21   Fenton, Clint R, PA  Multiple Vitamins-Minerals (PRESERVISION AREDS 2) CAPS Take 1 capsule by mouth 2 (two) times a day.    [provider]  Omega-3 Fatty Acids (OMEGA 3 PO) Take 3 capsules by mouth every evening.    [provider]  omeprazole (PRILOSEC) 20 MG capsule Take 20 mg by mouth daily.    Darlin Coco, MD  rivaroxaban (XARELTO) 20 MG TABS tablet Take 1 tablet (20 mg total) by mouth daily with supper. 05/15/21   Fenton, Clint R, PA  solifenacin (VESICARE) 10 MG tablet Take 10 mg by mouth at bedtime.     [provider]  tamsulosin (FLOMAX) 0.4 MG CAPS capsule Take 1 capsule (0.4 mg total) by mouth daily after supper. 12/25/18   Tyler Pita, MD  Testosterone 20.25 MG/ACT (1.62%) GEL Apply 1 Pump topically daily. 09/01/21   [provider]  vitamin B-12 (CYANOCOBALAMIN) 500 MCG tablet Take 500 mcg by mouth every evening.     [provider]      Allergies  Penicillins    Review of Systems   Review of Systems  Physical Exam Updated Vital Signs BP (!) 160/84   Pulse 81   Temp 98.2 F (36.8 C) (Oral)   Resp 20   Ht 6' (1.829 m)   Wt 103 kg   SpO2 95%   BMI 30.79 kg/m  Physical Exam Vitals and nursing note reviewed.  Constitutional:      Appearance: He is well-developed.  HENT:     Head: Normocephalic and atraumatic.     Mouth/Throat:     Mouth: Mucous membranes are moist.  Cardiovascular:     Rate and Rhythm: Normal rate.  Pulmonary:     Effort: Pulmonary effort is normal. No respiratory distress.  Abdominal:     General: Abdomen is flat. There is no distension.     Tenderness: There  is no abdominal tenderness.  Musculoskeletal:        General: Normal range of motion.     Cervical back: Normal range of motion.  Skin:    General: Skin is warm and dry.  Neurological:     Mental Status: He is alert.     ED Results / Procedures / Treatments   Labs (all labs ordered are listed, but only abnormal results are displayed) Labs Reviewed  URINE CULTURE  URINALYSIS, ROUTINE W REFLEX MICROSCOPIC    EKG None  Radiology No results found.  Procedures Procedures    Medications Ordered in ED Medications - No data to display  ED Course/ Medical Decision Making/ A&P                           Medical Decision Making  Some urine with blood in the bag. Per nursing, Catheter flushed easily.  Catheter bag changed, already with some output, will send to the lab mostly for culture as the UA will likely be unreadable.    Final Clinical Impression(s) / ED Diagnoses Final diagnoses:  Hematuria, unspecified type    Rx / DC Orders ED Discharge Orders     None         Jaylean Buenaventura, Corene Cornea, MD 11/13/21 [phone]

## 2021-11-13 NOTE — ED Notes (Signed)
Pt reports catheter placed on Thursday d/t hematuria.

## 2021-11-13 NOTE — ED Notes (Signed)
Pt discharged to home. Discharge instructions have been discussed with patient and/or family members. Pt verbally acknowledges understanding d/c instructions, and endorses comprehension to checkout at registration before leaving. Pt instructed to contact pcp when next avail.

## 2021-11-13 NOTE — ED Notes (Signed)
Changed foley drainage bag, flushed catheter. Flushed with 20 cc, 100 cc return

## 2021-11-14 LAB — URINE CULTURE: Culture: NO GROWTH

## 2021-11-16 DIAGNOSIS — R338 Other retention of urine: Secondary | ICD-10-CM | POA: Diagnosis not present

## 2021-11-16 DIAGNOSIS — R310 Gross hematuria: Secondary | ICD-10-CM | POA: Diagnosis not present

## 2021-11-22 DIAGNOSIS — R350 Frequency of micturition: Secondary | ICD-10-CM | POA: Diagnosis not present

## 2021-11-23 DIAGNOSIS — H353132 Nonexudative age-related macular degeneration, bilateral, intermediate dry stage: Secondary | ICD-10-CM | POA: Diagnosis not present

## 2021-11-28 DIAGNOSIS — B351 Tinea unguium: Secondary | ICD-10-CM | POA: Diagnosis not present

## 2021-11-28 DIAGNOSIS — I739 Peripheral vascular disease, unspecified: Secondary | ICD-10-CM | POA: Diagnosis not present

## 2021-11-28 DIAGNOSIS — L603 Nail dystrophy: Secondary | ICD-10-CM | POA: Diagnosis not present

## 2021-11-28 DIAGNOSIS — L84 Corns and callosities: Secondary | ICD-10-CM | POA: Diagnosis not present

## 2021-11-29 ENCOUNTER — Other Ambulatory Visit: Payer: Self-pay | Admitting: Physician Assistant

## 2021-11-29 NOTE — Telephone Encounter (Signed)
Prescription refill request for Xarelto received.  Indication:Afib Last office visit:3/23 Weight:103 kg Age:88 Scr:0.7 CrCl:112.4 ml/min  Prescription refilled

## 2021-11-29 NOTE — Telephone Encounter (Signed)
*  STAT* If patient is at the pharmacy, call can be transferred to refill team.   1. Which medications need to be refilled? (please list name of each medication and dose if known)   rivaroxaban (XARELTO) 20 MG TABS tablet  2. Which pharmacy/location (including street and city if local pharmacy) is medication to be sent to?  [id number] [place], [place] [address] 3. Do they need a 30 day or 90 day supply?  90 day  Patient stated he has about a week's worth of medication left.

## 2021-12-03 ENCOUNTER — Emergency Department (HOSPITAL_BASED_OUTPATIENT_CLINIC_OR_DEPARTMENT_OTHER): Payer: Medicare PPO

## 2021-12-03 ENCOUNTER — Encounter (HOSPITAL_BASED_OUTPATIENT_CLINIC_OR_DEPARTMENT_OTHER): Payer: Self-pay | Admitting: Emergency Medicine

## 2021-12-03 ENCOUNTER — Emergency Department (HOSPITAL_COMMUNITY)
Admission: EM | Admit: 2021-12-03 | Discharge: 2021-12-04 | Disposition: A | Discharge status: Home / Self Care | Payer: Medicare PPO | Source: Home / Self Care | Attending: Emergency Medicine | Admitting: Emergency Medicine

## 2021-12-03 ENCOUNTER — Emergency Department (HOSPITAL_BASED_OUTPATIENT_CLINIC_OR_DEPARTMENT_OTHER)
Admission: EM | Admit: 2021-12-03 | Discharge: 2021-12-03 | Disposition: A | Discharge status: Home / Self Care | Payer: Medicare PPO | Attending: Emergency Medicine | Admitting: Emergency Medicine

## 2021-12-03 ENCOUNTER — Encounter (HOSPITAL_BASED_OUTPATIENT_CLINIC_OR_DEPARTMENT_OTHER): Payer: Self-pay

## 2021-12-03 ENCOUNTER — Encounter (HOSPITAL_COMMUNITY): Payer: Self-pay | Admitting: Emergency Medicine

## 2021-12-03 ENCOUNTER — Emergency Department (HOSPITAL_BASED_OUTPATIENT_CLINIC_OR_DEPARTMENT_OTHER)
Admission: EM | Admit: 2021-12-03 | Discharge: 2021-12-03 | Disposition: A | Discharge status: Home / Self Care | Payer: Medicare PPO | Source: Home / Self Care | Attending: Emergency Medicine | Admitting: Emergency Medicine

## 2021-12-03 ENCOUNTER — Other Ambulatory Visit: Payer: Self-pay

## 2021-12-03 DIAGNOSIS — N3091 Cystitis, unspecified with hematuria: Secondary | ICD-10-CM | POA: Insufficient documentation

## 2021-12-03 DIAGNOSIS — N3090 Cystitis, unspecified without hematuria: Secondary | ICD-10-CM | POA: Insufficient documentation

## 2021-12-03 DIAGNOSIS — Z7901 Long term (current) use of anticoagulants: Secondary | ICD-10-CM | POA: Diagnosis not present

## 2021-12-03 DIAGNOSIS — Z79899 Other long term (current) drug therapy: Secondary | ICD-10-CM | POA: Insufficient documentation

## 2021-12-03 DIAGNOSIS — Y732 Prosthetic and other implants, materials and accessory gastroenterology and urology devices associated with adverse incidents: Secondary | ICD-10-CM | POA: Insufficient documentation

## 2021-12-03 DIAGNOSIS — I10 Essential (primary) hypertension: Secondary | ICD-10-CM | POA: Insufficient documentation

## 2021-12-03 DIAGNOSIS — R310 Gross hematuria: Secondary | ICD-10-CM

## 2021-12-03 DIAGNOSIS — Z466 Encounter for fitting and adjustment of urinary device: Secondary | ICD-10-CM | POA: Diagnosis not present

## 2021-12-03 DIAGNOSIS — R42 Dizziness and giddiness: Secondary | ICD-10-CM | POA: Insufficient documentation

## 2021-12-03 DIAGNOSIS — R319 Hematuria, unspecified: Secondary | ICD-10-CM | POA: Diagnosis present

## 2021-12-03 DIAGNOSIS — N3289 Other specified disorders of bladder: Secondary | ICD-10-CM | POA: Diagnosis not present

## 2021-12-03 DIAGNOSIS — S301XXA Contusion of abdominal wall, initial encounter: Secondary | ICD-10-CM | POA: Diagnosis not present

## 2021-12-03 DIAGNOSIS — T83098D Other mechanical complication of other indwelling urethral catheter, subsequent encounter: Secondary | ICD-10-CM | POA: Diagnosis not present

## 2021-12-03 DIAGNOSIS — T83091A Other mechanical complication of indwelling urethral catheter, initial encounter: Secondary | ICD-10-CM | POA: Diagnosis not present

## 2021-12-03 DIAGNOSIS — N329 Bladder disorder, unspecified: Secondary | ICD-10-CM | POA: Diagnosis not present

## 2021-12-03 DIAGNOSIS — R339 Retention of urine, unspecified: Secondary | ICD-10-CM | POA: Diagnosis not present

## 2021-12-03 DIAGNOSIS — T839XXD Unspecified complication of genitourinary prosthetic device, implant and graft, subsequent encounter: Secondary | ICD-10-CM

## 2021-12-03 DIAGNOSIS — K5730 Diverticulosis of large intestine without perforation or abscess without bleeding: Secondary | ICD-10-CM | POA: Diagnosis not present

## 2021-12-03 DIAGNOSIS — N281 Cyst of kidney, acquired: Secondary | ICD-10-CM | POA: Diagnosis not present

## 2021-12-03 LAB — COMPREHENSIVE METABOLIC PANEL
ALT: 17 U/L (ref 0–44)
AST: 18 U/L (ref 15–41)
Albumin: 3.8 g/dL (ref 3.5–5.0)
Alkaline Phosphatase: 58 U/L (ref 38–126)
Anion gap: 10 (ref 5–15)
BUN: 20 mg/dL (ref 8–23)
CO2: 23 mmol/L (ref 22–32)
Calcium: 8.9 mg/dL (ref 8.9–10.3)
Chloride: 105 mmol/L (ref 98–111)
Creatinine, Ser: 0.95 mg/dL (ref 0.61–1.24)
GFR, Estimated: >60 mL/min (ref >60)
Glucose, Bld: 146 mg/dL — ABNORMAL HIGH (ref 70–99)
Potassium: 4 mmol/L (ref 3.5–5.1)
Sodium: 138 mmol/L (ref 135–145)
Total Bilirubin: 0.9 mg/dL (ref 0.3–1.2)
Total Protein: 6.7 g/dL (ref 6.5–8.1)

## 2021-12-03 LAB — CBC WITH DIFFERENTIAL/PLATELET
Abs Immature Granulocytes: 0.01 10*3/uL (ref 0.00–0.07)
Abs Immature Granulocytes: 0.03 10*3/uL (ref 0.00–0.07)
Basophils Absolute: 0 10*3/uL (ref 0.0–0.1)
Basophils Absolute: 0 10*3/uL (ref 0.0–0.1)
Basophils Relative: 0 %
Basophils Relative: 0 %
Eosinophils Absolute: 0.2 10*3/uL (ref 0.0–0.5)
Eosinophils Absolute: 0 10*3/uL (ref 0.0–0.5)
Eosinophils Relative: 3 %
Eosinophils Relative: 0 %
HCT: 38.9 % — ABNORMAL LOW (ref 39.0–52.0)
HCT: 38.5 % — ABNORMAL LOW (ref 39.0–52.0)
Hemoglobin: 13.1 g/dL (ref 13.0–17.0)
Hemoglobin: 12.8 g/dL — ABNORMAL LOW (ref 13.0–17.0)
Immature Granulocytes: 0 %
Immature Granulocytes: 0 %
Lymphocytes Relative: 24 %
Lymphocytes Relative: 10 %
Lymphs Abs: 1.2 10*3/uL (ref 0.7–4.0)
Lymphs Abs: 0.9 10*3/uL (ref 0.7–4.0)
MCH: 34.9 pg — ABNORMAL HIGH (ref 26.0–34.0)
MCH: 34.8 pg — ABNORMAL HIGH (ref 26.0–34.0)
MCHC: 33.7 g/dL (ref 30.0–36.0)
MCHC: 33.2 g/dL (ref 30.0–36.0)
MCV: 103.7 fL — ABNORMAL HIGH (ref 80.0–100.0)
MCV: 104.6 fL — ABNORMAL HIGH (ref 80.0–100.0)
Monocytes Absolute: 0.6 10*3/uL (ref 0.1–1.0)
Monocytes Absolute: 1 10*3/uL (ref 0.1–1.0)
Monocytes Relative: 12 %
Monocytes Relative: 11 %
Neutro Abs: 2.9 10*3/uL (ref 1.7–7.7)
Neutro Abs: 7 10*3/uL (ref 1.7–7.7)
Neutrophils Relative %: 61 %
Neutrophils Relative %: 79 %
Platelets: 132 10*3/uL — ABNORMAL LOW (ref 150–400)
Platelets: 147 10*3/uL — ABNORMAL LOW (ref 150–400)
RBC: 3.75 MIL/uL — ABNORMAL LOW (ref 4.22–5.81)
RBC: 3.68 MIL/uL — ABNORMAL LOW (ref 4.22–5.81)
RDW: 13.8 % (ref 11.5–15.5)
RDW: 14 % (ref 11.5–15.5)
WBC: 4.9 10*3/uL (ref 4.0–10.5)
WBC: 8.9 10*3/uL (ref 4.0–10.5)
nRBC: 0 % (ref 0.0–0.2)
nRBC: 0 % (ref 0.0–0.2)

## 2021-12-03 LAB — URINALYSIS, ROUTINE W REFLEX MICROSCOPIC

## 2021-12-03 LAB — URINALYSIS, MICROSCOPIC (REFLEX): RBC / HPF: >50 RBC/hpf (ref 0–5)

## 2021-12-03 LAB — BASIC METABOLIC PANEL
Anion gap: 6 (ref 5–15)
BUN: 23 mg/dL (ref 8–23)
CO2: 24 mmol/L (ref 22–32)
Calcium: 8.7 mg/dL — ABNORMAL LOW (ref 8.9–10.3)
Chloride: 109 mmol/L (ref 98–111)
Creatinine, Ser: 0.78 mg/dL (ref 0.61–1.24)
GFR, Estimated: >60 mL/min (ref >60)
Glucose, Bld: 128 mg/dL — ABNORMAL HIGH (ref 70–99)
Potassium: 4 mmol/L (ref 3.5–5.1)
Sodium: 139 mmol/L (ref 135–145)

## 2021-12-03 MED ORDER — SULFAMETHOXAZOLE-TRIMETHOPRIM 800-160 MG PO TABS: 1.0000 | ORAL_TABLET | Freq: Two times a day (BID) | ORAL | 0 refills | Status: AC

## 2021-12-03 MED ORDER — SULFAMETHOXAZOLE-TRIMETHOPRIM 800-160 MG PO TABS
1.0000 | ORAL_TABLET | Freq: Once | ORAL | Status: AC
  Administered 2021-12-03: 1 via ORAL
  Filled 2021-12-03: qty 1

## 2021-12-03 NOTE — ED Notes (Signed)
Per ED MD orders, 40f urinary catheter irrigated again x 2 with 343m of sterile water each flush, immediate return of thinner amt of bloody/ pink drainage noted into urinary catheter drainage tubing. Pt tolerated very well. No further complaints of pain or discomfort noted at suprapubic area.

## 2021-12-03 NOTE — ED Triage Notes (Signed)
Urinary catheter in place, states thought he had a blockage today as he felt he was retaining and had decreased output, flushed catheter. On the way to ED had increased output, decreased pressure. Urine red, obvious blood, states is worsening since yesterday.

## 2021-12-03 NOTE — ED Triage Notes (Addendum)
Pt states hx of urinary blockage from bleeding. States he has been unable to urinate much since 7pm last night. Pt takes Xarelto.

## 2021-12-03 NOTE — ED Notes (Signed)
Urinary Drainage Bag changed to leg bag.

## 2021-12-03 NOTE — ED Notes (Signed)
Leg bag applied

## 2021-12-03 NOTE — ED Provider Notes (Addendum)
Emergency Department Provider Note   I have reviewed the triage vital signs and the nursing notes.   HISTORY  Chief Complaint Hematuria   HPI Connor [name] is a 88 y.o. male past medical history reviewed below including paroxysmal A-fib on Xarelto presents to the emergency department for evaluation of continued hematuria and urinary retention.  He was seen earlier this morning with hematuria and an 18 French Foley catheter was placed with resolution of retention symptoms.  He has had hematuria in the past and follows with [facility].  He returned home after blood work and abdominal imaging but noticed that he was having return of retention symptoms.  He attempted to flush his Foley catheter at home but was unable to get return of fluid.  He had increasing suprapubic pain and so returns to the ED for evaluation.  Since arrival in the emergency department he is begun passing large amounts of bloody urine and his abdominal symptoms/pain have significantly improved.    Past Medical History:  Diagnosis Date   Benign localized prostatic hyperplasia with lower urinary tract symptoms (LUTS)    urologist-- dr Diona Fanti   Chronic anticoagulation    Xarelto for afib   Chronic constipation    Chronic cystitis    Gross hematuria    History of DVT of lower extremity 06/08/2010   right femoral dvt dx post op total hip 05-08-2010   History of gastric ulcer    remote   History of kidney stones    one stone. remote hx   Hyperlipidemia    Hypertension    Dx by Dr. Lance Muss around age  16    NICM (nonischemic cardiomyopathy) ([place])    per echo 06-11-2017,  ef 45-50% with diffuse hypokinesis,  G1DD   OSA on CPAP 2017   compliant with CPAP.  managed by Dr Elsworth Soho.   Osteoarthritis    Other urethral stricture, male, meatal    chronic;  pt self dilates   Paroxysmal atrial fibrillation [facility]) cardiologist-- dr Rayann Heman   first dx 2009/   a. documented by Dr Barrett Shell on office EKG 9/12. b.  maintained on Tikosyn and Xarelto. c. s/p DCCV 's.  d.  x3  EP w/ ablation atrial fib last one 12-05-2016   Prediabetes    Premature ventricular contraction    PVC's (premature ventricular contractions)    RA (rheumatoid arthritis) ([place])    rheumatologist--- Dr Page Spiro,  treated with Humira   RBBB (right bundle branch block)    Hx of a fib, ablation Aug 2018   Rheumatoid arthritis ([place])    Vitamin D deficiency    Wears hearing aid in both ears     Review of Systems  Constitutional: No fever/chills Cardiovascular: Denies chest pain. Respiratory: Denies shortness of breath. Gastrointestinal: No abdominal pain.  No nausea, no vomiting.  No diarrhea.  No constipation. Genitourinary: Positive hematuria and suprapubic pain.  Musculoskeletal: Negative for back pain. Skin: Negative for rash. Neurological: Negative for headaches, focal weakness or numbness.   ____________________________________________   PHYSICAL EXAM:  VITAL SIGNS: ED Triage Vitals  Enc Vitals Group     BP 12/03/21 1108 130/80     Pulse Rate 12/03/21 1108 (!) 103     Resp 12/03/21 1108 19     Temp 12/03/21 1108 98 F (36.7 C)     Temp Source 12/03/21 1108 Oral     SpO2 12/03/21 1108 92 %   Constitutional: Alert and oriented. Well appearing and  in no acute distress. Eyes: Conjunctivae are normal.  Head: Atraumatic. Nose: No congestion/rhinnorhea. Mouth/Throat: Mucous membranes are moist.  Neck: No stridor.   Cardiovascular: Normal rate, regular rhythm. Good peripheral circulation. Grossly normal heart sounds.   Respiratory: Normal respiratory effort.  No retractions. Lungs CTAB. Gastrointestinal: Soft and nontender. No distention.  Genitourinary: 2 French Foley catheter in place with bright red blood in the leg bag attached. No visible clot.  Musculoskeletal:  No gross deformities of extremities. Neurologic:  Normal speech and language.  Skin:  Skin is warm, dry and intact. No rash  noted.  ____________________________________________  RADIOLOGY  CT Renal Stone Study  Result Date: 12/03/2021 CLINICAL DATA:  Flank pain with kidney stone suspected. Urinary blockage from bleeding. EXAM: CT ABDOMEN AND PELVIS WITHOUT CONTRAST TECHNIQUE: Multidetector CT imaging of the abdomen and pelvis was performed following the standard protocol without IV contrast. RADIATION DOSE REDUCTION: This exam was performed according to the departmental dose-optimization program which includes automated exposure control, adjustment of the mA and/or kV according to patient size and/or use of iterative reconstruction technique. COMPARISON:  Pelvis MRI 11/05/2018 FINDINGS: Lower chest:  No contributory findings. Hepatobiliary: No focal liver abnormality.No evidence of biliary obstruction or stone. Pancreas: Unremarkable. Spleen: Unremarkable. Adrenals/Urinary Tract: Negative adrenals. No hydronephrosis or stone. 4.5 cm left renal cyst. Moderate distension of the bladder with high-density mass in the low lumen measuring ~ 5 cm. The mass is at least partially from hematoma, but could obscure lumen based neoplasm. Diffuse thickening in the bladder wall with a small diverticulum at the dome, bladder wall thickening seen on 2020 pelvis MRI. Suboptimal inferior bladder visualization due to streak artifact. Stomach/Bowel: No obstruction. No visible bowel inflammation. Colonic diverticulosis. Vascular/Lymphatic: No acute vascular abnormality. No mass or adenopathy. Reproductive:Post treatment prostate with area partially obscured by artifact from the right hip Other: No ascites or pneumoperitoneum. Musculoskeletal: Right hip arthroplasty. Linear sclerosis at the left sacral ala suggesting prior insufficiency fracture. Severe lumbar spine degeneration with levoscoliosis. IMPRESSION: Distended urinary bladder with 5 cm internal high-density mass that is at least partially from hematoma given the history. No hydronephrosis.  Electronically Signed   By: Jorje Guild M.D.   On: 12/03/2021 06:30    ____________________________________________   PROCEDURES  Procedure(s) performed:   Procedures  None  ____________________________________________   INITIAL IMPRESSION / ASSESSMENT AND PLAN / ED COURSE  Pertinent labs & imaging results that were available during my care of the patient were reviewed by me and considered in my medical decision making (see chart for details).   This patient is Presenting for Evaluation of abdominal pain, which does require a range of treatment options, and is a complaint that involves a high risk of morbidity and mortality.  The Differential Diagnoses includes but is not exclusive to acute appendicitis, renal colic, testicular torsion, urinary tract infection, prostatitis,  diverticulitis, small bowel obstruction, colitis, abdominal aortic aneurysm, gastroenteritis, constipation etc.   Critical Interventions- Bladder irrigation  Reassessment after intervention:  Retention improved and urine flowing.    I did obtain Additional Historical Information from son at bedside.  I decided to review pertinent External Data, and in summary patient seen earlier today in the ED, note reviewed.   Clinical Laboratory Tests: Independently interpreted lab results from earlier this morning showing no acute kidney injury.  No anemia.  Hematuria on UA which was sent for culture.  Radiologic Tests: Independently interpreted CT imaging from earlier this morning, CT renal.  Agree with radiology interpretation.  Medical Decision Making: Summary:  Patient presents with continued hematuria.  He does not have clear evidence of acute urinary retention at this time although suspect he may be intermittently obstructing due to clot in the bladder.  Will perform hand irrigation at the bedside and consider larger gauge catheter pending the results of her initial attempted irrigation.   Reevaluation with  update and discussion with patient and son. Patient irrigated x 1000 ml with passage of many small clots. Urine flowing well. Plan for d/c with Urology follow up plan.   Consult: At the time of discharge the patient did have some return of retention and we attempted to irrigate without success.  Ultimately patient was changed to 37 [place] Foley catheter and a large amount of clot was able to pass and be irrigated.  He is now flowing much more freely.  I did discuss the case at this point with Dr. Jeffie Pollock who agrees with plan to hold Xarelto and discharged home with strict return precautions to the [facility] ED should symptoms return.  He will try and help coordinate a close follow-up appointment with urology as well.   Disposition: discharge  ____________________________________________  FINAL CLINICAL IMPRESSION(S) / ED DIAGNOSES  Final diagnoses:  Gross hematuria  Urinary retention    Note:  This document was prepared using Dragon voice recognition software and may include unintentional dictation errors.  Nanda Quinton, MD, [facility]    Leonell Lobdell, Wonda Olds, MD 12/03/21 1310    Blanka Rockholt, Wonda Olds, MD 12/03/21 1426

## 2021-12-03 NOTE — ED Notes (Signed)
ED Provider at bedside. 

## 2021-12-03 NOTE — ED Notes (Signed)
Pt began complaining of suprapubic pain, no drainage noted coming from urinary catheter. Attempted to irrigate catheter with 37m of sterile water, able to irrigate with water, but no return noted. Spoke with ED MD, orders rec to change to larger french catheter. Upon removal of current catheter large blood clot noted. Penis was cleaned and betadine applied. 271f10ml catheter inserted with no difficulties noted, client tolerated very well, immediate return of 15032mf hematuria noted in urinary drainage bag.

## 2021-12-03 NOTE — ED Notes (Signed)
Patient transported to CT 

## 2021-12-03 NOTE — Discharge Instructions (Signed)
You were seen in the emergency room today with blood in the urine and urinary retention.  We were able to irrigate the bladder and flush out some clots.  He can attempt flushing the catheter at home as well.  Please follow closely with your urologist.  If you develop return of urinary retention or inability to pass urine into the Foley catheter you need to return to the emergency department for assistance if you are unable to free this obstruction on your own.

## 2021-12-03 NOTE — ED Notes (Signed)
Given po fluids, son at bedside

## 2021-12-03 NOTE — ED Triage Notes (Signed)
Patient seen multiple times at ED over the past day for having a catheter placed for retention, and now catheter is clotting off. Patient has been flushing multiple times at home. Visible bloody urine in bag at this time.

## 2021-12-03 NOTE — ED Provider Triage Note (Signed)
Emergency Medicine Provider Triage Evaluation Note  Connor [name] , a 88 y.o. male  was evaluated in triage.  Pt complains of gross hematuria. [facility] and was discharged after bladder irrigation. He states that at home he tried to irrigate twice and feels like he is backing up again.  No fevers.  He is anticoagulated.  He states that he has been bleeding "a lot" with large clots.  Physical Exam  BP 128/74 (BP Location: Left Arm)   Pulse 100   Temp (!) 97.4 F (36.3 C) (Oral)   Resp 18   SpO2 97%  Gen:   Awake, no distress   Resp:  Normal effort  MSK:   Moves extremities without difficulty  Other:  Foley bag on leg with obvious blood.   Medical Decision Making  Medically screening exam initiated at 10:48 PM.  Appropriate orders placed.  [name] was informed that the remainder of the evaluation will be completed by another provider, this initial triage assessment does not replace that evaluation, and the importance of remaining in the ED until their evaluation is complete.     Lorin Glass, [place] 12/03/21 2250

## 2021-12-03 NOTE — ED Provider Notes (Signed)
[facility] EMERGENCY DEPARTMENT Provider Note   CSN: [id number] Arrival date & time: 12/03/21  0543     History  Chief Complaint  Patient presents with   Urinary Retention    Connor [name] is a 88 y.o. male.  The history is provided by the patient.  Hematuria This is a recurrent problem. The current episode started yesterday. The problem occurs constantly. The problem has not changed since onset.Pertinent negatives include no abdominal pain, no headaches and no shortness of breath. Associated symptoms comments: Now urinary retention since last night . Nothing aggravates the symptoms. Nothing relieves the symptoms. He has tried nothing for the symptoms. The treatment provided no relief.  Patient with a h/o of hematuria and retention on Xarelto presents with same.       Home Medications Prior to Admission medications   Medication Sig Start Date End Date Taking? Authorizing Provider  sulfamethoxazole-trimethoprim (BACTRIM DS) 800-160 MG tablet Take 1 tablet by mouth 2 (two) times daily for 7 days. 12/03/21 12/10/21 Yes Ciera Beckum, MD  acetaminophen (TYLENOL) 500 MG tablet Take 1,000-1,500 mg by mouth 2 (two) times daily as needed for moderate pain.     [provider]  atorvastatin (LIPITOR) 10 MG tablet TAKE 1 TABLET BY MOUTH EVERY DAY 10/29/21   Copland, Gay Filler, MD  calcium carbonate (TUMS - DOSED IN MG ELEMENTAL CALCIUM) 500 MG chewable tablet Chew 1 tablet by mouth daily.    [provider]  CALCIUM PO Take by mouth.    [provider]  Carboxymethylcellulose Sodium (THERATEARS OP) Apply 1 drop to eye daily as needed (dry eyes).    [provider]  Cholecalciferol (VITAMIN D3) 5000 UNITS TABS Take 5,000 Units by mouth See admin instructions. Every day    [provider]  clindamycin (CLEOCIN T) 1 % external solution Apply 1 application topically daily as needed (for bumps in scalp). Apply to scalp 06/23/14   [provider]  Cranberry 1000 MG CAPS Take 2,000-3,000 mg by mouth See admin instructions. Take 20000 mg in the morning and 3000 mg in the evening    [provider]  Docusate Calcium (STOOL SOFTENER PO) Take 2 tablets by mouth at bedtime.     [provider]  finasteride (PROSCAR) 5 MG tablet Take 5 mg by mouth every morning.  03/11/18   [provider]  HUMIRA PEN 40 MG/0.4ML PNKT Inject 40 mg as directed every 7 (seven) days. Every Tuesday 03/26/18   [provider]  lisinopril (ZESTRIL) 40 MG tablet TAKE 1 TABLET BY MOUTH EVERY DAY 07/02/21   Copland, Gay Filler, MD  metoprolol succinate (TOPROL-XL) 50 MG 24 hr tablet TAKE 1/2 TABLET IN THE MORNING AND TAKE 1 TABLET IN THE EVENING 09/05/21   Fenton, Clint R, PA  Multiple Vitamins-Minerals (PRESERVISION AREDS 2) CAPS Take 1 capsule by mouth 2 (two) times a day.    [provider]  Omega-3 Fatty Acids (OMEGA 3 PO) Take 3 capsules by mouth every evening.    [provider]  omeprazole (PRILOSEC) 20 MG capsule Take 20 mg by mouth daily.    Darlin Coco, MD  solifenacin (VESICARE) 10 MG tablet Take 10 mg by mouth at bedtime.     [provider]  tamsulosin (FLOMAX) 0.4 MG CAPS capsule Take 1 capsule (0.4 mg total) by mouth daily after supper. 12/25/18   Tyler Pita, MD  Testosterone 20.25 MG/ACT (1.62%) GEL Apply 1 Pump topically daily. 09/01/21  [provider]  vitamin B-12 (CYANOCOBALAMIN) 500 MCG tablet Take 500 mcg by mouth every evening.     [provider]  XARELTO 20 MG TABS tablet TAKE 1 TABLET BY MOUTH DAILY WITH SUPPER. 11/29/21   Fenton, Clint R, PA      Allergies    Penicillins    Review of Systems   Review of Systems  Constitutional:  Negative for fever.  HENT:  Negative for facial swelling.   Respiratory:  Negative for shortness of breath.   Gastrointestinal:  Negative for abdominal pain.  Genitourinary:  Positive for difficulty urinating and  hematuria.  Neurological:  Negative for headaches.  All other systems reviewed and are negative.   Physical Exam Updated Vital Signs BP (!) 146/70   Pulse 73   Temp 98.1 F (36.7 C) (Oral)   Resp 20   Ht 6' (1.829 m)   Wt 103 kg   SpO2 97%   BMI 30.79 kg/m  Physical Exam Vitals and nursing note reviewed. Exam conducted with a chaperone present.  Constitutional:      General: He is not in acute distress.    Appearance: He is well-developed. He is not diaphoretic.  HENT:     Head: Normocephalic and atraumatic.     Nose: Nose normal.  Eyes:     Conjunctiva/sclera: Conjunctivae normal.     Pupils: Pupils are equal, round, and reactive to light.  Cardiovascular:     Rate and Rhythm: Normal rate and regular rhythm.  Pulmonary:     Effort: Pulmonary effort is normal.     Breath sounds: Normal breath sounds. No wheezing or rales.  Abdominal:     General: Bowel sounds are normal.     Palpations: Abdomen is soft.     Tenderness: There is no abdominal tenderness. There is no guarding or rebound.  Musculoskeletal:        General: Normal range of motion.     Cervical back: Normal range of motion and neck supple.  Skin:    General: Skin is warm and dry.     Capillary Refill: Capillary refill takes less than 2 seconds.  Neurological:     Mental Status: He is alert and oriented to person, place, and time.     ED Results / Procedures / Treatments   Labs (all labs ordered are listed, but only abnormal results are displayed) Results for orders placed or performed during the hospital encounter of 12/03/21  CBC with Differential  Result Value Ref Range   WBC 4.9 4.0 - 10.5 K/uL   RBC 3.75 (L) 4.22 - 5.81 MIL/uL   Hemoglobin 13.1 13.0 - 17.0 g/dL   HCT 38.9 (L) 39.0 - 52.0 %   MCV 103.7 (H) 80.0 - 100.0 fL   MCH 34.9 (H) 26.0 - 34.0 pg   MCHC 33.7 30.0 - 36.0 g/dL   RDW 13.8 11.5 - 15.5 %   Platelets 132 (L) 150 - 400 K/uL   nRBC 0.0 0.0 - 0.2 %   Neutrophils Relative % 61 %    Neutro Abs 2.9 1.7 - 7.7 K/uL   Lymphocytes Relative 24 %   Lymphs Abs 1.2 0.7 - 4.0 K/uL   Monocytes Relative 12 %   Monocytes Absolute 0.6 0.1 - 1.0 K/uL   Eosinophils Relative 3 %   Eosinophils Absolute 0.2 0.0 - 0.5 K/uL   Basophils Relative 0 %   Basophils Absolute 0.0 0.0 - 0.1 K/uL   Immature Granulocytes 0 %  Abs Immature Granulocytes 0.01 0.00 - 0.07 K/uL  Basic metabolic panel  Result Value Ref Range   Sodium 139 135 - 145 mmol/L   Potassium 4.0 3.5 - 5.1 mmol/L   Chloride 109 98 - 111 mmol/L   CO2 24 22 - 32 mmol/L   Glucose, Bld 128 (H) 70 - 99 mg/dL   BUN 23 8 - 23 mg/dL   Creatinine, Ser 0.78 0.61 - 1.24 mg/dL   Calcium 8.7 (L) 8.9 - 10.3 mg/dL   GFR, Estimated >60 >60 mL/min   Anion gap 6 5 - 15   *Note: Due to a large number of results and/or encounters for the requested time period, some results have not been displayed. A complete set of results can be found in Results Review.   CT Renal Stone Study  Result Date: 12/03/2021 CLINICAL DATA:  Flank pain with kidney stone suspected. Urinary blockage from bleeding. EXAM: CT ABDOMEN AND PELVIS WITHOUT CONTRAST TECHNIQUE: Multidetector CT imaging of the abdomen and pelvis was performed following the standard protocol without IV contrast. RADIATION DOSE REDUCTION: This exam was performed according to the departmental dose-optimization program which includes automated exposure control, adjustment of the mA and/or kV according to patient size and/or use of iterative reconstruction technique. COMPARISON:  Pelvis MRI 11/05/2018 FINDINGS: Lower chest:  No contributory findings. Hepatobiliary: No focal liver abnormality.No evidence of biliary obstruction or stone. Pancreas: Unremarkable. Spleen: Unremarkable. Adrenals/Urinary Tract: Negative adrenals. No hydronephrosis or stone. 4.5 cm left renal cyst. Moderate distension of the bladder with high-density mass in the low lumen measuring ~ 5 cm. The mass is at least partially from  hematoma, but could obscure lumen based neoplasm. Diffuse thickening in the bladder wall with a small diverticulum at the dome, bladder wall thickening seen on 2020 pelvis MRI. Suboptimal inferior bladder visualization due to streak artifact. Stomach/Bowel: No obstruction. No visible bowel inflammation. Colonic diverticulosis. Vascular/Lymphatic: No acute vascular abnormality. No mass or adenopathy. Reproductive:Post treatment prostate with area partially obscured by artifact from the right hip Other: No ascites or pneumoperitoneum. Musculoskeletal: Right hip arthroplasty. Linear sclerosis at the left sacral ala suggesting prior insufficiency fracture. Severe lumbar spine degeneration with levoscoliosis. IMPRESSION: Distended urinary bladder with 5 cm internal high-density mass that is at least partially from hematoma given the history. No hydronephrosis. Electronically Signed   By: Jorje Guild M.D.   On: 12/03/2021 06:30     None  Radiology CT Renal Stone Study  Result Date: 12/03/2021 CLINICAL DATA:  Flank pain with kidney stone suspected. Urinary blockage from bleeding. EXAM: CT ABDOMEN AND PELVIS WITHOUT CONTRAST TECHNIQUE: Multidetector CT imaging of the abdomen and pelvis was performed following the standard protocol without IV contrast. RADIATION DOSE REDUCTION: This exam was performed according to the departmental dose-optimization program which includes automated exposure control, adjustment of the mA and/or kV according to patient size and/or use of iterative reconstruction technique. COMPARISON:  Pelvis MRI 11/05/2018 FINDINGS: Lower chest:  No contributory findings. Hepatobiliary: No focal liver abnormality.No evidence of biliary obstruction or stone. Pancreas: Unremarkable. Spleen: Unremarkable. Adrenals/Urinary Tract: Negative adrenals. No hydronephrosis or stone. 4.5 cm left renal cyst. Moderate distension of the bladder with high-density mass in the low lumen measuring ~ 5 cm. The mass is  at least partially from hematoma, but could obscure lumen based neoplasm. Diffuse thickening in the bladder wall with a small diverticulum at the dome, bladder wall thickening seen on 2020 pelvis MRI. Suboptimal inferior bladder visualization due to streak artifact. Stomach/Bowel: No obstruction. No  visible bowel inflammation. Colonic diverticulosis. Vascular/Lymphatic: No acute vascular abnormality. No mass or adenopathy. Reproductive:Post treatment prostate with area partially obscured by artifact from the right hip Other: No ascites or pneumoperitoneum. Musculoskeletal: Right hip arthroplasty. Linear sclerosis at the left sacral ala suggesting prior insufficiency fracture. Severe lumbar spine degeneration with levoscoliosis. IMPRESSION: Distended urinary bladder with 5 cm internal high-density mass that is at least partially from hematoma given the history. No hydronephrosis. Electronically Signed   By: Jorje Guild M.D.   On: 12/03/2021 06:30    Procedures Procedures    Medications Ordered in ED Medications  sulfamethoxazole-trimethoprim (BACTRIM DS) 800-160 MG per tablet 1 tablet (has no administration in time range)    ED Course/ Medical Decision Making/ A&P                           Medical Decision Making Patient with hematuria and retention.  Has been seen for this previously   Amount and/or Complexity of Data Reviewed Independent Historian: spouse    Details: see above External Data Reviewed: notes.    Details: previous notes reviewed Labs: ordered.    Details: all labs reviewed: normal white count 4.9 and hemoglobin 13.1 .  Normal sodium and potassium and creatinine .78, gross hematuria on urine Radiology: ordered and independent interpretation performed.    Details: CT be me enlarged bladder not stools, bladder lesion with clot,  ASCVD  Risk Prescription drug management. Risk Details: Foley catheter inserted with large volume of bloody urine.  Patient and wife informed of  bladder lesion and need for close follow up with urology for ongoing testing and treatment.  Moreover patient will need in office voiding trial.  No elevation of creatinine.  Normal hemoglobin.  Stable for discharge with close follow up.      Final Clinical Impression(s) / ED Diagnoses Final diagnoses:  Gross hematuria  Lesion of bladder  Cystitis  Urinary retention     Return for intractable cough, coughing up blood, fevers > 100.4 unrelieved by medication, shortness of breath, intractable vomiting, chest pain, shortness of breath, weakness, numbness, changes in speech, facial asymmetry, abdominal pain, passing out, Inability to tolerate liquids or food, cough, altered mental status or any concerns. No signs of systemic illness or infection. The patient is nontoxic-appearing on exam and vital signs are within normal limits.  I have reviewed the triage vital signs and the nursing notes. Pertinent labs & imaging results that were available during my care of the patient were reviewed by me and considered in my medical decision making (see chart for details). After history, exam, and medical workup I feel the patient has been appropriately medically screened and is safe for discharge home. Pertinent diagnoses were discussed with the patient. Patient was given return precautions.  Rx / DC Orders ED Discharge Orders          Ordered    sulfamethoxazole-trimethoprim (BACTRIM DS) 800-160 MG tablet  2 times daily        12/03/21 0642              Alyissa Whidbee, MD 12/03/21 3500

## 2021-12-04 DIAGNOSIS — R3914 Feeling of incomplete bladder emptying: Secondary | ICD-10-CM | POA: Diagnosis not present

## 2021-12-04 LAB — URINE CULTURE
Culture: NO GROWTH
Special Requests: NORMAL

## 2021-12-04 NOTE — ED Provider Notes (Signed)
[facility] DEPT Provider Note   CSN: [id number] Arrival date & time: 12/03/21  2218     History  Chief Complaint  Patient presents with   Urinary Catheter Problem    Connor [name] is a 88 y.o. male.  88 y/o male with hx of Afib on Xarelto presents to the ED for c/o persistent gross hematuria with intermittent retention.  This is his third visit to the ED in the past 24 hours.  Was at [facility] earlier for this same issue this afternoon.  Had catheter changed to 22Fr foley with subsequent irrigation with some improvement.  Went home and reports his catheter became clogged twice. Was able to irrigate it the first time with resolution, but unable to clear the clot during his second irrigation attempt. C/o some pressure in his suprapubic abdomen. Also notes some lightheadedness. No fevers, vomiting.  Urologist - Dr. Diona Fanti  The history is provided by the patient and a relative. No language interpreter was used.       Home Medications Prior to Admission medications   Medication Sig Start Date End Date Taking? Authorizing Provider  acetaminophen (TYLENOL) 500 MG tablet Take 1,000-1,500 mg by mouth 2 (two) times daily as needed for moderate pain.     [provider]  atorvastatin (LIPITOR) 10 MG tablet TAKE 1 TABLET BY MOUTH EVERY DAY Patient taking differently: Take 10 mg by mouth daily. 10/29/21   Copland, Gay Filler, MD  calcium carbonate (TUMS - DOSED IN MG ELEMENTAL CALCIUM) 500 MG chewable tablet Chew 1 tablet by mouth daily.    [provider]  CALCIUM PO Take by mouth.    [provider]  Carboxymethylcellulose Sodium (THERATEARS OP) Apply 1 drop to eye daily as needed (dry eyes).    [provider]  Cholecalciferol (VITAMIN D3) 5000 UNITS TABS Take 5,000 Units by mouth See admin instructions. Every day    [provider]  clindamycin (CLEOCIN T) 1 % external solution Apply 1 application topically daily  as needed (for bumps in scalp). Apply to scalp 06/23/14   [provider]  Cranberry 1000 MG CAPS Take 2,000-3,000 mg by mouth See admin instructions. Take 20000 mg in the morning and 3000 mg in the evening    [provider]  Docusate Calcium (STOOL SOFTENER PO) Take 2 tablets by mouth at bedtime.     [provider]  finasteride (PROSCAR) 5 MG tablet Take 5 mg by mouth every morning.  03/11/18   [provider]  HUMIRA PEN 40 MG/0.4ML PNKT Inject 40 mg as directed every 7 (seven) days. Every Tuesday 03/26/18   [provider]  lisinopril (ZESTRIL) 40 MG tablet TAKE 1 TABLET BY MOUTH EVERY DAY Patient taking differently: Take 40 mg by mouth daily. 07/02/21   Copland, Gay Filler, MD  metoprolol succinate (TOPROL-XL) 50 MG 24 hr tablet TAKE 1/2 TABLET IN THE MORNING AND TAKE 1 TABLET IN THE EVENING 09/05/21   Fenton, Clint R, PA  Multiple Vitamins-Minerals (PRESERVISION AREDS 2) CAPS Take 1 capsule by mouth 2 (two) times a day.    [provider]  Omega-3 Fatty Acids (OMEGA 3 PO) Take 3 capsules by mouth every evening.    [provider]  omeprazole (PRILOSEC) 20 MG capsule Take 20 mg by mouth daily.    Darlin Coco, MD  solifenacin (VESICARE) 10 MG tablet Take 10 mg by mouth at bedtime.     [provider]  sulfamethoxazole-trimethoprim (BACTRIM DS)  800-160 MG tablet Take 1 tablet by mouth 2 (two) times daily for 7 days. 12/03/21 12/10/21  Palumbo, April, MD  tamsulosin (FLOMAX) 0.4 MG CAPS capsule Take 1 capsule (0.4 mg total) by mouth daily after supper. 12/25/18   Tyler Pita, MD  Testosterone 20.25 MG/ACT (1.62%) GEL Apply 1 Pump topically daily. 09/01/21   [provider]  vitamin B-12 (CYANOCOBALAMIN) 500 MCG tablet Take 500 mcg by mouth every evening.     [provider]  XARELTO 20 MG TABS tablet TAKE 1 TABLET BY MOUTH DAILY WITH SUPPER. 11/29/21   Fenton, Clint R, PA      Allergies    Penicillins     Review of Systems   Review of Systems Ten systems reviewed and are negative for acute change, except as noted in the HPI.    Physical Exam Updated Vital Signs BP (!) 143/69   Pulse 68   Temp 98.1 F (36.7 C) (Oral)   Resp 15   SpO2 96%   Physical Exam Vitals and nursing note reviewed.  Constitutional:      General: He is not in acute distress.    Appearance: He is well-developed. He is not diaphoretic.     Comments: Nontoxic appearing and in NAD  HENT:     Head: Normocephalic and atraumatic.  Eyes:     General: No scleral icterus.    Conjunctiva/sclera: Conjunctivae normal.  Pulmonary:     Effort: Pulmonary effort is normal. No respiratory distress.     Comments: Respirations even and unlabored Genitourinary:    Comments: Dark red bloody urine in leg bag without visible clot. Musculoskeletal:        General: Normal range of motion.     Cervical back: Normal range of motion.  Skin:    General: Skin is warm and dry.     Coloration: Skin is not pale.     Findings: No erythema or rash.  Neurological:     Mental Status: He is alert and oriented to person, place, and time.  Psychiatric:        Behavior: Behavior normal.     ED Results / Procedures / Treatments   Labs (all labs ordered are listed, but only abnormal results are displayed) Labs Reviewed  COMPREHENSIVE METABOLIC PANEL - Abnormal; Notable for the following components:      Result Value   Glucose, Bld 146 (*)    All other components within normal limits  CBC WITH DIFFERENTIAL/PLATELET - Abnormal; Notable for the following components:   RBC 3.68 (*)    Hemoglobin 12.8 (*)    HCT 38.5 (*)    MCV 104.6 (*)    MCH 34.8 (*)    Platelets 147 (*)    All other components within normal limits    EKG None  Radiology CT Renal Stone Study  Result Date: 12/03/2021 CLINICAL DATA:  Flank pain with kidney stone suspected. Urinary blockage from bleeding. EXAM: CT ABDOMEN AND PELVIS WITHOUT CONTRAST  TECHNIQUE: Multidetector CT imaging of the abdomen and pelvis was performed following the standard protocol without IV contrast. RADIATION DOSE REDUCTION: This exam was performed according to the departmental dose-optimization program which includes automated exposure control, adjustment of the mA and/or kV according to patient size and/or use of iterative reconstruction technique. COMPARISON:  Pelvis MRI 11/05/2018 FINDINGS: Lower chest:  No contributory findings. Hepatobiliary: No focal liver abnormality.No evidence of biliary obstruction or stone. Pancreas: Unremarkable. Spleen: Unremarkable. Adrenals/Urinary Tract: Negative adrenals. No hydronephrosis or stone. 4.5  cm left renal cyst. Moderate distension of the bladder with high-density mass in the low lumen measuring ~ 5 cm. The mass is at least partially from hematoma, but could obscure lumen based neoplasm. Diffuse thickening in the bladder wall with a small diverticulum at the dome, bladder wall thickening seen on 2020 pelvis MRI. Suboptimal inferior bladder visualization due to streak artifact. Stomach/Bowel: No obstruction. No visible bowel inflammation. Colonic diverticulosis. Vascular/Lymphatic: No acute vascular abnormality. No mass or adenopathy. Reproductive:Post treatment prostate with area partially obscured by artifact from the right hip Other: No ascites or pneumoperitoneum. Musculoskeletal: Right hip arthroplasty. Linear sclerosis at the left sacral ala suggesting prior insufficiency fracture. Severe lumbar spine degeneration with levoscoliosis. IMPRESSION: Distended urinary bladder with 5 cm internal high-density mass that is at least partially from hematoma given the history. No hydronephrosis. Electronically Signed   By: Jorje Guild M.D.   On: 12/03/2021 06:30    Procedures Procedures    Medications Ordered in ED Medications - No data to display  ED Course/ Medical Decision Making/ A&P Clinical Course as of 12/04/21 0435  Tue  Dec 04, 2021  0040 RN given instructions for interval irrigation. [KH]  [phone] RN able to freely irrigate x 3 with gradual improvement in degree of hematuria. Will observe and repeat irrigation after 15-20 minutes x 2. [KH]  0132 Able to successfully irrigate foley on second occasion. Urine output less bloody. [VO]  1607 RN flushed foley with 67ms x2 times without difficulty. Blood tinged drainage. [KH]    Clinical Course User Index [KH] HAntonietta Breach PA-C                           Medical Decision Making  This patient presents to the ED for concern of hematuria, this involves an extensive number of treatment options, and is a complaint that carries with it a high risk of complications and morbidity.  The differential diagnosis includes cystitis vs bladder mass vs trauma   Co morbidities that complicate the patient evaluation  Afib Chronic anticoagulation   Additional history obtained:  Additional history obtained from son at bedside External records from outside source obtained and reviewed including prior CT scan from 12/02/21   Lab Tests:  I Ordered, and personally interpreted labs.  The pertinent results include:  Hgb 12.8, stable.   Cardiac Monitoring:  The patient was maintained on a cardiac monitor.  I personally viewed and interpreted the cardiac monitored which showed an underlying rhythm of: NSR   Critical Interventions:  Multiple rounds of bladder irrigation to ensure no recurrent blockage of foley catheter   Problem List / ED Course:  As above   Reevaluation:  After the interventions noted above, I reevaluated the patient and found that they have :improved   Social Determinants of Health:  Insured    Dispostion:  After consideration of the diagnostic results and the patients response to treatment, I feel that the patent would benefit from close outpatient Urology follow up. He has an appt scheduled for Wednesday. There were no issues with bladder  irrigation in the ED on 3 separate occassions. Patient encouraged to continue this at home, as necessary. Return precautions discussed and provided. Patient discharged in stable condition with no unaddressed concerns.          Final Clinical Impression(s) / ED Diagnoses Final diagnoses:  Problem with Foley catheter, subsequent encounter  Gross hematuria    Rx / DC Orders ED Discharge Orders  None         Antonietta Breach, PA-C 12/04/21 [place], April, MD 12/04/21 0530

## 2021-12-04 NOTE — ED Notes (Signed)
RN flushed foley with 18ms x2 times without difficulty. Blood tinged drainage.

## 2021-12-04 NOTE — ED Notes (Signed)
Irrigated foley catheter without difficulty. RN flushed foley with 97ms x3. Bloody drainage from foley after flush.

## 2021-12-04 NOTE — Discharge Instructions (Signed)
Continue irrigation at home as needed for management. Follow up with Urology as scheduled. Return for new or concerning symptoms.

## 2021-12-04 NOTE — ED Notes (Signed)
RN flushed foley with 72ms x2 without difficulty. Drainage less bloody than previous flush.

## 2021-12-05 ENCOUNTER — Telehealth: Payer: Self-pay

## 2021-12-05 NOTE — Telephone Encounter (Signed)
     Patient  visit on 12/03/2021  at [facility] was for Urinary Blockage  Have you been able to follow up with your primary care physician? yes  The patient was or was not able to obtain any needed medicine or equipment.yes  Are there diet recommendations that you are having difficulty following? na  At the emergency room visit patient was provided discharge instructions from the ED provider. yes   Patient expresses understanding of instructions and education provided has no other needs at this time. yes     [facility]  [phone] [address], [place], [place] [address] Phone: [phone] Email: [email or web address]

## 2021-12-06 ENCOUNTER — Encounter: Payer: Self-pay | Admitting: Cardiology

## 2021-12-10 DIAGNOSIS — N401 Enlarged prostate with lower urinary tract symptoms: Secondary | ICD-10-CM | POA: Diagnosis not present

## 2021-12-10 DIAGNOSIS — R310 Gross hematuria: Secondary | ICD-10-CM | POA: Diagnosis not present

## 2021-12-10 DIAGNOSIS — Z8546 Personal history of malignant neoplasm of prostate: Secondary | ICD-10-CM | POA: Diagnosis not present

## 2021-12-18 DIAGNOSIS — R310 Gross hematuria: Secondary | ICD-10-CM | POA: Diagnosis not present

## 2021-12-18 DIAGNOSIS — R338 Other retention of urine: Secondary | ICD-10-CM | POA: Diagnosis not present

## 2021-12-23 DIAGNOSIS — H353132 Nonexudative age-related macular degeneration, bilateral, intermediate dry stage: Secondary | ICD-10-CM | POA: Diagnosis not present

## 2021-12-26 NOTE — Progress Notes (Addendum)
HPI: Follow-up atrial fibrillation, hypertension, hyperlipidemia.  Previously followed by Dr. Rayann Heman.  Patient had a normal cardiac catheterization in 2013.  Patient had atrial fibrillation ablation in 2017 and 18.  He again had atrial fibrillation ablation in October 2021 with Dr. Rayann Heman.  Last echocardiogram October 2021 showed normal LV function, mild left ventricular enlargement, grade 1 diastolic dysfunction, mild right ventricular enlargement, mild biatrial enlargement.  Recently discharged following admission with recurrent hematuria.  Xarelto was discontinued.  Since last seen, he states he had 1 episode of atrial fibrillation for short amount of time recently but otherwise has done well.  He continues to have episodes of hematuria.  He denies dyspnea, chest pain or syncope.  Current Outpatient Medications  Medication Sig Dispense Refill   acetaminophen (TYLENOL) 500 MG tablet Take 1,000 mg by mouth 2 (two) times daily as needed for moderate pain.     atorvastatin (LIPITOR) 10 MG tablet TAKE 1 TABLET BY MOUTH EVERY DAY (Patient taking differently: Take 10 mg by mouth daily.) 90 tablet 3   calcium carbonate (TUMS - DOSED IN MG ELEMENTAL CALCIUM) 500 MG chewable tablet Chew 1 tablet by mouth daily as needed for heartburn.     Carboxymethylcellulose Sodium (THERATEARS OP) Apply 1 drop to eye daily as needed (dry eyes).     Cholecalciferol (VITAMIN D3) 5000 UNITS TABS Take 5,000 Units by mouth daily.     Cranberry 1000 MG CAPS Take 3,000 mg by mouth at bedtime.     Docusate Calcium (STOOL SOFTENER PO) Take 4 tablets by mouth at bedtime.     finasteride (PROSCAR) 5 MG tablet Take 5 mg by mouth every morning.   3   HUMIRA PEN 40 MG/0.4ML PNKT Inject 40 mg as directed every 7 (seven) days. Every Tuesday  6   lisinopril (ZESTRIL) 40 MG tablet TAKE 1 TABLET BY MOUTH EVERY DAY (Patient taking differently: Take 40 mg by mouth at bedtime.) 90 tablet 3   metoprolol succinate (TOPROL-XL) 50 MG 24  hr tablet TAKE 1/2 TABLET IN THE MORNING AND TAKE 1 TABLET IN THE EVENING (Patient taking differently: Take 25-50 mg by mouth See admin instructions. Take 1/2 tablet by mouth in the morning and 1 tablet in the evening) 135 tablet 3   Multiple Vitamins-Minerals (PRESERVISION AREDS 2) CAPS Take 1 capsule by mouth 2 (two) times a day.     omeprazole (PRILOSEC) 20 MG capsule Take 20 mg by mouth daily.     solifenacin (VESICARE) 10 MG tablet Take 10 mg by mouth at bedtime.      tamsulosin (FLOMAX) 0.4 MG CAPS capsule Take 1 capsule (0.4 mg total) by mouth daily after supper. 30 capsule 5   Testosterone 20.25 MG/ACT (1.62%) GEL Apply 1 Pump topically at bedtime. Apply to shoulders     vitamin B-12 (CYANOCOBALAMIN) 500 MCG tablet Take 500 mcg by mouth every evening.      No current facility-administered medications for this visit.     Past Medical History:  Diagnosis Date   Benign localized prostatic hyperplasia with lower urinary tract symptoms (LUTS)    urologist-- dr Diona Fanti   Chronic anticoagulation    Xarelto for afib   Chronic constipation    Chronic cystitis    Gross hematuria    History of DVT of lower extremity 06/08/2010   right femoral dvt dx post op total hip 05-08-2010   History of gastric ulcer    remote   History of kidney stones  one stone. remote hx   Hyperlipidemia    Hypertension    Dx by Dr. Lance Muss around age  28    NICM (nonischemic cardiomyopathy) ([place])    per echo 06-11-2017,  ef 45-50% with diffuse hypokinesis,  G1DD   OSA on CPAP 2017   compliant with CPAP.  managed by Dr Elsworth Soho.   Osteoarthritis    Other urethral stricture, male, meatal    chronic;  pt self dilates   Paroxysmal atrial fibrillation [place]) cardiologist-- dr Rayann Heman   first dx 2009/   a. documented by Dr Barrett Shell on office EKG 9/12. b. maintained on Tikosyn and Xarelto. c. s/p DCCV 's.  d.  x3  EP w/ ablation atrial fib last one 12-05-2016   Prediabetes    Premature ventricular contraction     PVC's (premature ventricular contractions)    RA (rheumatoid arthritis) ([place])    rheumatologist--- Dr Page Spiro,  treated with Humira   RBBB (right bundle branch block)    Hx of a fib, ablation Aug 2018   Rheumatoid arthritis ([place])    Vitamin D deficiency    Wears hearing aid in both ears     Past Surgical History:  Procedure Laterality Date   ABLATION OF DYSRHYTHMIC FOCUS  08/29/2015   ATRIAL FIBRILLATION ABLATION  12/05/2016   ATRIAL FIBRILLATION ABLATION N/A 12/05/2016   Procedure: Atrial Fibrillation Ablation;  Surgeon: [name], MD;  Location: [facility];  Service: Cardiovascular;  Laterality: N/A;   ATRIAL FIBRILLATION ABLATION N/A 02/29/2020   Procedure: ATRIAL FIBRILLATION ABLATION;  Surgeon: [name], MD;  Location: [facility];  Service: Cardiovascular;  Laterality: N/A;   ATRIAL FLUTTER ABLATION  09/2010   by Glenford Bayley Right 1990s   CARDIOVERSION N/A 10/28/2012   Procedure: CARDIOVERSION;  Surgeon: Burnell Blanks, MD;  Location: [facility];  Service: Cardiovascular;  Laterality: N/A;   CATARACT EXTRACTION W/ INTRAOCULAR LENS  IMPLANT, BILATERAL  2019   CYSTOSCOPY WITH BIOPSY N/A 09/03/2018   Procedure: CYSTOSCOPY WITH FULGURATION Fran Lowes;  Surgeon: Franchot Gallo, MD;  Location: [facility];  Service: Urology;  Laterality: N/A;  30 MINS   ELECTROPHYSIOLOGIC STUDY N/A 08/29/2015   Procedure: Atrial Fibrillation Ablation;  Surgeon: [name], MD;  Location: [facility];  Service: Cardiovascular;  Laterality: N/A;   KNEE SURGERY Bilateral x3   1960s & 1970s   open   LEFT HEART CATHETERIZATION WITH CORONARY ANGIOGRAM N/A 06/07/2011   Procedure: LEFT HEART CATHETERIZATION WITH CORONARY ANGIOGRAM;  Surgeon: Peter M Martinique, MD;  Location: [facility];  Service: Cardiovascular;  Laterality: N/A;    Normal coronary arteries,  low normal LVF   TONSILLECTOMY AND ADENOIDECTOMY  age 88   TOTAL HIP ARTHROPLASTY Right 06/04/10    dr Noemi Chapel  '@[facility]'$     TOTAL KNEE ARTHROPLASTY Bilateral right 1995;  left 1997   TRANSURETHRAL RESECTION OF PROSTATE  06-29-2002   dr humphries  '@[facility]'$    TRANSURETHRAL RESECTION OF PROSTATE N/A 07/06/2018   Procedure: TRANSURETHRAL RESECTION OF THE PROSTATE (TURP);  Surgeon: Franchot Gallo, MD;  Location: [facility];  Service: Urology;  Laterality: N/A;  54 MINS    Social History   Socioeconomic History   Marital status: Married    Spouse name: Joycelyn Schmid   Number of children: Not on file   Years of education: Not on file   Highest education level: Not on file  Occupational History   Occupation: Retired  Tobacco Use  Smoking status: Former    Packs/day: 1.00    Years: 30.00    Total pack years: 30.00    Types: Cigarettes    Quit date: 05/06/1978    Years since quitting: 43.7   Smokeless tobacco: Never   Tobacco comments:    Former smoker 03/14/2021  Vaping Use   Vaping Use: Never used  Substance and Sexual Activity   Alcohol use: Yes    Comment: occasionally wine and beer   Drug use: Never   Sexual activity: Not on file  Other Topics Concern   Not on file  Social History Narrative   he patient lives in [place] with his spouse.  Retired.   Social Determinants of Health   Financial Resource Strain: Low Risk  (06/21/2021)   Overall Financial Resource Strain (CARDIA)    Difficulty of Paying Living Expenses: Not hard at all  Food Insecurity: No Food Insecurity (01/08/2022)   Hunger Vital Sign    Worried About Running Out of Food in the Last Year: Never true    Ran Out of Food in the Last Year: Never true  Transportation Needs: No Transportation Needs (01/08/2022)   PRAPARE - Hydrologist (Medical): No    Lack of Transportation (Non-Medical): No  Physical Activity: Insufficiently Active (06/21/2021)   Exercise Vital Sign    Days of Exercise per Week: 2 days    Minutes of Exercise per Session: 50 min  Stress: No Stress Concern Present (06/08/2020)    [place]    Feeling of Stress : Not at all  Social Connections: Moderately Isolated (06/21/2021)   Social Connection and Isolation Panel [NHANES]    Frequency of Communication with Friends and Family: More than three times a week    Frequency of Social Gatherings with Friends and Family: More than three times a week    Attends Religious Services: Never    Marine scientist or Organizations: No    Attends Archivist Meetings: Never    Marital Status: Married  Human resources officer Violence: Not At Risk (06/21/2021)   Humiliation, Afraid, Rape, and Kick questionnaire    Fear of Current or Ex-Partner: No    Emotionally Abused: No    Physically Abused: No    Sexually Abused: No    Family History  Problem Relation Age of Onset   Heart attack Father    Lung disease Mother    Arrhythmia Sister    Atrial fibrillation Brother    Diabetes Neg Hx     ROS: no fevers or chills, productive cough, hemoptysis, dysphasia, odynophagia, melena, hematochezia, dysuria, hematuria, rash, seizure activity, orthopnea, PND, pedal edema, claudication. Remaining systems are negative.  Physical Exam: Well-developed well-nourished in no acute distress.  Skin is warm and dry.  HEENT is normal.  Neck is supple.  Chest is clear to auscultation with normal expansion.  Cardiovascular exam is regular rate and rhythm.  Abdominal exam nontender or distended. No masses palpated. Extremities show no edema. neuro grossly intact  ECG-normal sinus rhythm at a rate of 81, right bundle branch block.  Personally reviewed  A/P  1 paroxysmal atrial fibrillation-status post ablation x3.  He remains in sinus rhythm.  Continue Toprol.  Recently had problems with hematuria and Xarelto was held.  He continues to have these episodes and is scheduled to see urology.  We will continue to hold Xarelto due to bleeding.  2 hypertension-blood pressure  controlled.  Continue present medications.  3 hyperlipidemia-continue statin.  4 nonischemic cardiomyopathy-LV function has improved on most recent echocardiogram.  5 obesity-we again discussed the importance of weight loss.  6 obstructive sleep apnea-continue CPAP.  Kirk Ruths, MD

## 2022-01-02 DIAGNOSIS — C61 Malignant neoplasm of prostate: Secondary | ICD-10-CM | POA: Diagnosis not present

## 2022-01-03 ENCOUNTER — Encounter (HOSPITAL_COMMUNITY): Payer: Self-pay | Admitting: Emergency Medicine

## 2022-01-03 ENCOUNTER — Other Ambulatory Visit: Payer: Self-pay

## 2022-01-03 ENCOUNTER — Inpatient Hospital Stay (HOSPITAL_COMMUNITY)
Admission: EM | Admit: 2022-01-03 | Discharge: 2022-01-05 | DRG: 696 | Disposition: A | Discharge status: Home / Self Care | Payer: Medicare PPO | Attending: Internal Medicine | Admitting: Internal Medicine

## 2022-01-03 ENCOUNTER — Emergency Department (HOSPITAL_COMMUNITY): Payer: Medicare PPO

## 2022-01-03 DIAGNOSIS — I4892 Unspecified atrial flutter: Secondary | ICD-10-CM | POA: Diagnosis present

## 2022-01-03 DIAGNOSIS — Z974 Presence of external hearing-aid: Secondary | ICD-10-CM

## 2022-01-03 DIAGNOSIS — N2889 Other specified disorders of kidney and ureter: Secondary | ICD-10-CM | POA: Diagnosis not present

## 2022-01-03 DIAGNOSIS — M069 Rheumatoid arthritis, unspecified: Secondary | ICD-10-CM | POA: Diagnosis present

## 2022-01-03 DIAGNOSIS — Z86718 Personal history of other venous thrombosis and embolism: Secondary | ICD-10-CM

## 2022-01-03 DIAGNOSIS — Z79899 Other long term (current) drug therapy: Secondary | ICD-10-CM | POA: Diagnosis not present

## 2022-01-03 DIAGNOSIS — Z7901 Long term (current) use of anticoagulants: Secondary | ICD-10-CM

## 2022-01-03 DIAGNOSIS — G4733 Obstructive sleep apnea (adult) (pediatric): Secondary | ICD-10-CM | POA: Diagnosis present

## 2022-01-03 DIAGNOSIS — Z8546 Personal history of malignant neoplasm of prostate: Secondary | ICD-10-CM

## 2022-01-03 DIAGNOSIS — N3289 Other specified disorders of bladder: Secondary | ICD-10-CM | POA: Diagnosis present

## 2022-01-03 DIAGNOSIS — I4819 Other persistent atrial fibrillation: Secondary | ICD-10-CM | POA: Diagnosis present

## 2022-01-03 DIAGNOSIS — Z96653 Presence of artificial knee joint, bilateral: Secondary | ICD-10-CM | POA: Diagnosis present

## 2022-01-03 DIAGNOSIS — C61 Malignant neoplasm of prostate: Secondary | ICD-10-CM | POA: Diagnosis present

## 2022-01-03 DIAGNOSIS — Z8249 Family history of ischemic heart disease and other diseases of the circulatory system: Secondary | ICD-10-CM | POA: Diagnosis not present

## 2022-01-03 DIAGNOSIS — K7689 Other specified diseases of liver: Secondary | ICD-10-CM | POA: Diagnosis not present

## 2022-01-03 DIAGNOSIS — I5032 Chronic diastolic (congestive) heart failure: Secondary | ICD-10-CM | POA: Diagnosis present

## 2022-01-03 DIAGNOSIS — N281 Cyst of kidney, acquired: Secondary | ICD-10-CM | POA: Diagnosis not present

## 2022-01-03 DIAGNOSIS — Z96641 Presence of right artificial hip joint: Secondary | ICD-10-CM | POA: Diagnosis present

## 2022-01-03 DIAGNOSIS — N3021 Other chronic cystitis with hematuria: Secondary | ICD-10-CM | POA: Diagnosis present

## 2022-01-03 DIAGNOSIS — R310 Gross hematuria: Principal | ICD-10-CM | POA: Diagnosis present

## 2022-01-03 DIAGNOSIS — K5909 Other constipation: Secondary | ICD-10-CM | POA: Diagnosis present

## 2022-01-03 DIAGNOSIS — I428 Other cardiomyopathies: Secondary | ICD-10-CM | POA: Diagnosis present

## 2022-01-03 DIAGNOSIS — E785 Hyperlipidemia, unspecified: Secondary | ICD-10-CM | POA: Diagnosis present

## 2022-01-03 DIAGNOSIS — I110 Hypertensive heart disease with heart failure: Secondary | ICD-10-CM | POA: Diagnosis present

## 2022-01-03 DIAGNOSIS — Y842 Radiological procedure and radiotherapy as the cause of abnormal reaction of the patient, or of later complication, without mention of misadventure at the time of the procedure: Secondary | ICD-10-CM | POA: Diagnosis present

## 2022-01-03 DIAGNOSIS — I483 Typical atrial flutter: Secondary | ICD-10-CM | POA: Diagnosis not present

## 2022-01-03 DIAGNOSIS — I10 Essential (primary) hypertension: Secondary | ICD-10-CM | POA: Diagnosis not present

## 2022-01-03 DIAGNOSIS — Z880 Allergy status to penicillin: Secondary | ICD-10-CM

## 2022-01-03 DIAGNOSIS — N400 Enlarged prostate without lower urinary tract symptoms: Secondary | ICD-10-CM | POA: Diagnosis present

## 2022-01-03 DIAGNOSIS — Z87891 Personal history of nicotine dependence: Secondary | ICD-10-CM

## 2022-01-03 DIAGNOSIS — G4730 Sleep apnea, unspecified: Secondary | ICD-10-CM | POA: Diagnosis present

## 2022-01-03 DIAGNOSIS — Z8711 Personal history of peptic ulcer disease: Secondary | ICD-10-CM | POA: Diagnosis not present

## 2022-01-03 DIAGNOSIS — R319 Hematuria, unspecified: Secondary | ICD-10-CM | POA: Diagnosis not present

## 2022-01-03 DIAGNOSIS — K5730 Diverticulosis of large intestine without perforation or abscess without bleeding: Secondary | ICD-10-CM | POA: Diagnosis not present

## 2022-01-03 LAB — CBC WITH DIFFERENTIAL/PLATELET
Abs Immature Granulocytes: 0.03 10*3/uL (ref 0.00–0.07)
Basophils Absolute: 0 10*3/uL (ref 0.0–0.1)
Basophils Relative: 0 %
Eosinophils Absolute: 0.1 10*3/uL (ref 0.0–0.5)
Eosinophils Relative: 1 %
HCT: 39.1 % (ref 39.0–52.0)
Hemoglobin: 12.7 g/dL — ABNORMAL LOW (ref 13.0–17.0)
Immature Granulocytes: 1 %
Lymphocytes Relative: 19 %
Lymphs Abs: 1 10*3/uL (ref 0.7–4.0)
MCH: 34.7 pg — ABNORMAL HIGH (ref 26.0–34.0)
MCHC: 32.5 g/dL (ref 30.0–36.0)
MCV: 106.8 fL — ABNORMAL HIGH (ref 80.0–100.0)
Monocytes Absolute: 0.6 10*3/uL (ref 0.1–1.0)
Monocytes Relative: 11 %
Neutro Abs: 3.6 10*3/uL (ref 1.7–7.7)
Neutrophils Relative %: 68 %
Platelets: 177 10*3/uL (ref 150–400)
RBC: 3.66 MIL/uL — ABNORMAL LOW (ref 4.22–5.81)
RDW: 13.8 % (ref 11.5–15.5)
WBC: 5.2 10*3/uL (ref 4.0–10.5)
nRBC: 0 % (ref 0.0–0.2)

## 2022-01-03 LAB — COMPREHENSIVE METABOLIC PANEL
ALT: 18 U/L (ref 0–44)
AST: 20 U/L (ref 15–41)
Albumin: 4.1 g/dL (ref 3.5–5.0)
Alkaline Phosphatase: 64 U/L (ref 38–126)
Anion gap: 7 (ref 5–15)
BUN: 17 mg/dL (ref 8–23)
CO2: 25 mmol/L (ref 22–32)
Calcium: 9 mg/dL (ref 8.9–10.3)
Chloride: 109 mmol/L (ref 98–111)
Creatinine, Ser: 0.87 mg/dL (ref 0.61–1.24)
GFR, Estimated: >60 mL/min (ref >60)
Glucose, Bld: 119 mg/dL — ABNORMAL HIGH (ref 70–99)
Potassium: 3.7 mmol/L (ref 3.5–5.1)
Sodium: 141 mmol/L (ref 135–145)
Total Bilirubin: 0.9 mg/dL (ref 0.3–1.2)
Total Protein: 7.2 g/dL (ref 6.5–8.1)

## 2022-01-03 LAB — URINALYSIS, ROUTINE W REFLEX MICROSCOPIC: Specific Gravity, Urine: 1.021 (ref 1.005–1.030)

## 2022-01-03 LAB — PROTIME-INR
INR: 1.1 (ref 0.8–1.2)
Prothrombin Time: 13.9 seconds (ref 11.4–15.2)

## 2022-01-03 MED ORDER — OXYCODONE-ACETAMINOPHEN 5-325 MG PO TABS
1.0000 | ORAL_TABLET | Freq: Once | ORAL | Status: DC
  Filled 2022-01-03: qty 1

## 2022-01-03 MED ORDER — SODIUM CHLORIDE 0.9 % IV SOLN
1.0000 g | Freq: Once | INTRAVENOUS | Status: AC
  Administered 2022-01-03: 1 g via INTRAVENOUS
  Filled 2022-01-03: qty 10

## 2022-01-03 NOTE — ED Provider Triage Note (Signed)
Emergency Medicine Provider Triage Evaluation Note  Connor [name] , a 88 y.o. male  was evaluated in triage.  Pt complains of hematuria.  Patient states that this afternoon he went to the restroom to have a BM.  He states that he was placing a little pressure on his abdomen to go to the bathroom and then had an episode of hematuria.  Of recent about a month ago he had a Foley catheter placed for urinary retention and then had multiple episodes of hematuria and clots requiring ER visits.  His Foley catheter was eventually removed and he states he has not had any issues until today.  He is on Xarelto.  Denies abdominal pain..  Review of Systems  Positive:  Negative:   Physical Exam  BP (!) 168/91 (BP Location: Left Arm)   Pulse 95   Temp 98.1 F (36.7 C) (Oral)   Resp 20   SpO2 96%  Gen:   Awake, no distress   Resp:  Normal effort  MSK:   Moves extremities without difficulty  Other:  No abdominal tenderness to palpation.  Gross hematuria at bedside  Medical Decision Making  Medically screening exam initiated at 6:34 PM.  Appropriate orders placed.  Connor [name] was informed that the remainder of the evaluation will be completed by another provider, this initial triage assessment does not replace that evaluation, and the importance of remaining in the ED until their evaluation is complete.     Mickie Hillier, PA-C 01/03/22 1835

## 2022-01-03 NOTE — ED Notes (Signed)
Pt to CT scan.

## 2022-01-03 NOTE — ED Notes (Signed)
Irrigated patients foley. Continuing to have bright red blood output. Patient also refused percocet medication.

## 2022-01-03 NOTE — ED Triage Notes (Addendum)
Pt presents with gross hematuria since this afternoon.  Hx of prostate cancer.  Tx was 3 years ago.  Pt has had episodes before of urinary retention and bleeding that eventually resolved but then today around 1500 began having bleeding again and feeling that he hasn't been emptying his bladder fully.  Not currently taking xarelto

## 2022-01-03 NOTE — ED Provider Notes (Signed)
[facility] DEPT Provider Note   CSN: [id number] Arrival date & time: 01/03/22  1740     History  Chief Complaint  Patient presents with   Hematuria    Connor [name] is a 88 y.o. male.  Patient with a history of atrial fibrillation on Xarelto, previous prostate cancer status postradiation here with hematuria since this afternoon around 3 PM.  States he was trying to have a bowel movement and straining on the toilet.  When he went to urinate he saw bright red blood with clots.  Feels like he is not emptying his bladder fully.  Had a similar problem about a month ago requiring a Foley catheter which was removed by urology about 1 to 2 weeks ago.  He has not had any issues since then.  Reports he is still having bright red blood with clots and a full bladder sensation.  Feels like he cannot empty his bladder completely and has pressure.  No fever, chills, nausea or vomiting.  No chest pain or shortness of breath. During July he stated that the Foley catheter was initially misplaced within his prostate had to be positioned by urology.  He was also told he had a possible mass in his bladder but urology ruled that out. Not receiving any chemo or radiation currently.  The history is provided by the patient.  Hematuria       Home Medications Prior to Admission medications   Medication Sig Start Date End Date Taking? Authorizing Provider  acetaminophen (TYLENOL) 500 MG tablet Take 1,000-1,500 mg by mouth 2 (two) times daily as needed for moderate pain.     [provider]  atorvastatin (LIPITOR) 10 MG tablet TAKE 1 TABLET BY MOUTH EVERY DAY Patient taking differently: Take 10 mg by mouth daily. 10/29/21   Copland, Gay Filler, MD  calcium carbonate (TUMS - DOSED IN MG ELEMENTAL CALCIUM) 500 MG chewable tablet Chew 1 tablet by mouth daily.    [provider]  CALCIUM PO Take by mouth.    [provider]  Carboxymethylcellulose  Sodium (THERATEARS OP) Apply 1 drop to eye daily as needed (dry eyes).    [provider]  Cholecalciferol (VITAMIN D3) 5000 UNITS TABS Take 5,000 Units by mouth See admin instructions. Every day    [provider]  clindamycin (CLEOCIN T) 1 % external solution Apply 1 application topically daily as needed (for bumps in scalp). Apply to scalp 06/23/14   [provider]  Cranberry 1000 MG CAPS Take 2,000-3,000 mg by mouth See admin instructions. Take 20000 mg in the morning and 3000 mg in the evening    [provider]  Docusate Calcium (STOOL SOFTENER PO) Take 2 tablets by mouth at bedtime.     [provider]  finasteride (PROSCAR) 5 MG tablet Take 5 mg by mouth every morning.  03/11/18   [provider]  HUMIRA PEN 40 MG/0.4ML PNKT Inject 40 mg as directed every 7 (seven) days. Every Tuesday 03/26/18   [provider]  lisinopril (ZESTRIL) 40 MG tablet TAKE 1 TABLET BY MOUTH EVERY DAY Patient taking differently: Take 40 mg by mouth daily. 07/02/21   Copland, Gay Filler, MD  metoprolol succinate (TOPROL-XL) 50 MG 24 hr tablet TAKE 1/2 TABLET IN THE MORNING AND TAKE 1 TABLET IN THE EVENING 09/05/21   Fenton, Clint R, PA  Multiple Vitamins-Minerals (PRESERVISION AREDS 2) CAPS Take 1 capsule by mouth 2 (two) times a day.  [provider]  Omega-3 Fatty Acids (OMEGA 3 PO) Take 3 capsules by mouth every evening.    [provider]  omeprazole (PRILOSEC) 20 MG capsule Take 20 mg by mouth daily.    Darlin Coco, MD  solifenacin (VESICARE) 10 MG tablet Take 10 mg by mouth at bedtime.     [provider]  tamsulosin (FLOMAX) 0.4 MG CAPS capsule Take 1 capsule (0.4 mg total) by mouth daily after supper. 12/25/18   Tyler Pita, MD  Testosterone 20.25 MG/ACT (1.62%) GEL Apply 1 Pump topically daily. 09/01/21   [provider]  vitamin B-12 (CYANOCOBALAMIN) 500 MCG tablet Take 500 mcg by mouth every evening.      [provider]  XARELTO 20 MG TABS tablet TAKE 1 TABLET BY MOUTH DAILY WITH SUPPER. 11/29/21   Fenton, Clint R, PA      Allergies    Penicillins    Review of Systems   Review of Systems  Genitourinary:  Positive for hematuria.    Physical Exam Updated Vital Signs BP (!) 168/91 (BP Location: Left Arm)   Pulse 95   Temp 98.1 F (36.7 C) (Oral)   Resp 20   SpO2 96%  Physical Exam Vitals and nursing note reviewed.  Constitutional:      General: He is not in acute distress.    Appearance: He is well-developed.  HENT:     Head: Normocephalic and atraumatic.     Mouth/Throat:     Pharynx: No oropharyngeal exudate.  Eyes:     Conjunctiva/sclera: Conjunctivae normal.     Pupils: Pupils are equal, round, and reactive to light.  Neck:     Comments: No meningismus. Cardiovascular:     Rate and Rhythm: Normal rate and regular rhythm.     Heart sounds: Normal heart sounds. No murmur heard. Pulmonary:     Effort: Pulmonary effort is normal. No respiratory distress.     Breath sounds: Normal breath sounds.  Abdominal:     Palpations: Abdomen is soft.     Tenderness: There is no abdominal tenderness. There is no guarding or rebound.  Genitourinary:    Comments: Dried blood in underwear.  Abdomen is soft. No gross blood on rectal Musculoskeletal:        General: No tenderness. Normal range of motion.     Cervical back: Normal range of motion and neck supple.  Skin:    General: Skin is warm.  Neurological:     Mental Status: He is alert and oriented to person, place, and time.     Cranial Nerves: No cranial nerve deficit.     Motor: No abnormal muscle tone.     Coordination: Coordination normal.     Comments:  5/5 strength throughout. CN 2-12 intact.Equal grip strength.   Psychiatric:        Behavior: Behavior normal.     ED Results / Procedures / Treatments   Labs (all labs ordered are listed, but only abnormal results are displayed) Labs Reviewed   COMPREHENSIVE METABOLIC PANEL - Abnormal; Notable for the following components:      Result Value   Glucose, Bld 119 (*)    All other components within normal limits  CBC WITH DIFFERENTIAL/PLATELET - Abnormal; Notable for the following components:   RBC 3.66 (*)    Hemoglobin 12.7 (*)    MCV 106.8 (*)    MCH 34.7 (*)    All other components within normal limits  URINALYSIS, ROUTINE W REFLEX MICROSCOPIC -  Abnormal; Notable for the following components:   Color, Urine RED (*)    APPearance   (*)    Value: TEST NOT REPORTED DUE TO COLOR INTERFERENCE OF URINE PIGMENT   Glucose, UA   (*)    Value: TEST NOT REPORTED DUE TO COLOR INTERFERENCE OF URINE PIGMENT   Hgb urine dipstick   (*)    Value: TEST NOT REPORTED DUE TO COLOR INTERFERENCE OF URINE PIGMENT   Bilirubin Urine   (*)    Value: TEST NOT REPORTED DUE TO COLOR INTERFERENCE OF URINE PIGMENT   Ketones, ur   (*)    Value: TEST NOT REPORTED DUE TO COLOR INTERFERENCE OF URINE PIGMENT   Protein, ur   (*)    Value: TEST NOT REPORTED DUE TO COLOR INTERFERENCE OF URINE PIGMENT   Nitrite   (*)    Value: TEST NOT REPORTED DUE TO COLOR INTERFERENCE OF URINE PIGMENT   Leukocytes,Ua   (*)    Value: TEST NOT REPORTED DUE TO COLOR INTERFERENCE OF URINE PIGMENT   All other components within normal limits  URINE CULTURE  PROTIME-INR    EKG None  Radiology CT Renal Stone Study  Result Date: 01/03/2022 CLINICAL DATA:  Urinary frequency. EXAM: CT ABDOMEN AND PELVIS WITHOUT CONTRAST TECHNIQUE: Multidetector CT imaging of the abdomen and pelvis was performed following the standard protocol without IV contrast. RADIATION DOSE REDUCTION: This exam was performed according to the departmental dose-optimization program which includes automated exposure control, adjustment of the mA and/or kV according to patient size and/or use of iterative reconstruction technique. COMPARISON:  December 03, 2021 FINDINGS: Lower chest: Mild linear atelectasis is seen  within the left lung base. Hepatobiliary: 15 mm, 7 mm and 8 mm simple cysts are seen within the right lobe of the liver. No gallstones, gallbladder wall thickening, or biliary dilatation. Pancreas: Unremarkable. No pancreatic ductal dilatation or surrounding inflammatory changes. Spleen: Normal in size without focal abnormality. Adrenals/Urinary Tract: Adrenal glands are unremarkable. Kidneys are normal in size, without renal calculi or hydronephrosis. A 4.4 cm diameter cyst is seen within the anterolateral aspect of the mid left kidney. A Foley catheter is seen within the urinary bladder. Its distal tip extends into the anterior urinary bladder wall (axial CT image 65, CT series 2). A 5.4 cm x 3.7 cm x 3.5 cm well-defined area of increased attenuation (approximately 50.17 Hounsfield units) is seen within the dependent portion of the bladder lumen. This is mildly increased in size when compared to the prior study. A small amount of air is seen along the anterior bladder wall. Stomach/Bowel: Stomach is within normal limits. Appendix appears normal. No evidence of bowel wall thickening, distention, or inflammatory changes. Noninflamed diverticula are seen within the descending and sigmoid colon. Vascular/Lymphatic: Aortic atherosclerosis. No enlarged abdominal or pelvic lymph nodes. Reproductive: The prostate gland is surgically absent. Other: No abdominal wall hernia or abnormality. No abdominopelvic ascites. Musculoskeletal: A total right hip replacement is seen with associated streak artifact and subsequently limited evaluation of the adjacent osseous and soft tissue structures. Multilevel degenerative changes seen throughout the lumbar spine. IMPRESSION: 1. Findings likely consistent with a large blood clot within the urinary bladder lumen, mildly increased in size when compared to the prior study. Further evaluation with urology consultation and cystography is recommended, as an underlying neoplastic process  cannot be excluded. 2. Colonic diverticulosis. 3. Status post prostatectomy. 4. Total right hip replacement. 5. Simple cyst within the left kidney (Bosniak 1). No additional follow-up or  imaging is recommended. This recommendation follows ACR consensus guidelines: Management of the Incidental Renal Mass on CT: A White Paper of the ACR Incidental Findings Committee. J Am Coll Radiol [phone]. 6. Foley catheter positioning, as described above with a small amount of air along the anterior urinary bladder lumen. A small amount of air within the bladder wall cannot be excluded. 7. Aortic atherosclerosis. Aortic Atherosclerosis (ICD10-I70.0). Electronically Signed   By: Virgina Norfolk M.D.   On: 01/03/2022 21:48    Procedures Procedures    Medications Ordered in ED Medications - No data to display  ED Course/ Medical Decision Making/ A&P                           Medical Decision Making Amount and/or Complexity of Data Reviewed Labs: ordered. Decision-making details documented in ED Course. Radiology: ordered and independent interpretation performed. Decision-making details documented in ED Course. ECG/medicine tests: ordered and independent interpretation performed. Decision-making details documented in ED Course.  Risk Prescription drug management. Decision regarding hospitalization.  Gross hematuria on Xarelto with history of same.  Feels like he cannot empty his bladder completely.  Vitals are stable.  Abdomen is soft  Bladder scan greater than 450 cc.  We will place 22 Foley catheter and attempt irrigation.  Foley catheter placed with gross hematuria without clots.  Patient feels better.  Discussed with Dr. Junious Silk of urology.  He recommends hand irrigation to pink consistency but does not feel urine needs to clear completely.  Recommends holding Xarelto for several days.  Hemoglobin is stable.  Creatinine is stable.  Patient feeling improved.  Bladder appears to be draining  well but there is significant resistance met with attempted irrigation. Nursing staff reports with any saline that is injected was unable to be suctioned out.  Discussed with Dr. Junious Silk of urology..  He agrees likely blockage of the catheter from blood clot or bladder wall.  CT scan today shows persistent clot within the bladder which may be stable from January but slightly progressed.  Patient reports he had a cystoscopy that showed no mass in July. Also some air in the bladder likely due to instrumentation versus infection.  Empiric antibiotics begun.  Patient still having gross hematuria.  Vitals and hemoglobin remained stable.  3 way catheter discussed with Dr. Junious Silk.  He prefers to evaluate patient in person.  Dr. Hal Hope to admit patient       Final Clinical Impression(s) / ED Diagnoses Final diagnoses:  Gross hematuria    Rx / DC Orders ED Discharge Orders     None         Neftaly Inzunza, Annie Main, MD 01/04/22 0041

## 2022-01-03 NOTE — ED Notes (Addendum)
Pt up to use restroom multiple times, states he feels urinary urgency and frequency but is unable to empty his bladder.

## 2022-01-04 ENCOUNTER — Encounter (HOSPITAL_COMMUNITY): Payer: Self-pay | Admitting: Internal Medicine

## 2022-01-04 DIAGNOSIS — R310 Gross hematuria: Secondary | ICD-10-CM | POA: Diagnosis present

## 2022-01-04 DIAGNOSIS — N3289 Other specified disorders of bladder: Secondary | ICD-10-CM | POA: Diagnosis present

## 2022-01-04 DIAGNOSIS — Z96641 Presence of right artificial hip joint: Secondary | ICD-10-CM | POA: Diagnosis present

## 2022-01-04 DIAGNOSIS — R319 Hematuria, unspecified: Secondary | ICD-10-CM | POA: Diagnosis present

## 2022-01-04 DIAGNOSIS — Z7901 Long term (current) use of anticoagulants: Secondary | ICD-10-CM | POA: Diagnosis not present

## 2022-01-04 DIAGNOSIS — Z8711 Personal history of peptic ulcer disease: Secondary | ICD-10-CM | POA: Diagnosis not present

## 2022-01-04 DIAGNOSIS — Z880 Allergy status to penicillin: Secondary | ICD-10-CM | POA: Diagnosis not present

## 2022-01-04 DIAGNOSIS — I4892 Unspecified atrial flutter: Secondary | ICD-10-CM | POA: Diagnosis present

## 2022-01-04 DIAGNOSIS — G4733 Obstructive sleep apnea (adult) (pediatric): Secondary | ICD-10-CM | POA: Diagnosis present

## 2022-01-04 DIAGNOSIS — I483 Typical atrial flutter: Secondary | ICD-10-CM | POA: Diagnosis not present

## 2022-01-04 DIAGNOSIS — I110 Hypertensive heart disease with heart failure: Secondary | ICD-10-CM | POA: Diagnosis present

## 2022-01-04 DIAGNOSIS — Z86718 Personal history of other venous thrombosis and embolism: Secondary | ICD-10-CM | POA: Diagnosis not present

## 2022-01-04 DIAGNOSIS — I10 Essential (primary) hypertension: Secondary | ICD-10-CM

## 2022-01-04 DIAGNOSIS — N400 Enlarged prostate without lower urinary tract symptoms: Secondary | ICD-10-CM | POA: Diagnosis present

## 2022-01-04 DIAGNOSIS — Z8249 Family history of ischemic heart disease and other diseases of the circulatory system: Secondary | ICD-10-CM | POA: Diagnosis not present

## 2022-01-04 DIAGNOSIS — N3021 Other chronic cystitis with hematuria: Secondary | ICD-10-CM | POA: Diagnosis present

## 2022-01-04 DIAGNOSIS — Z79899 Other long term (current) drug therapy: Secondary | ICD-10-CM | POA: Diagnosis not present

## 2022-01-04 DIAGNOSIS — Y842 Radiological procedure and radiotherapy as the cause of abnormal reaction of the patient, or of later complication, without mention of misadventure at the time of the procedure: Secondary | ICD-10-CM | POA: Diagnosis present

## 2022-01-04 DIAGNOSIS — M069 Rheumatoid arthritis, unspecified: Secondary | ICD-10-CM | POA: Diagnosis present

## 2022-01-04 DIAGNOSIS — Z87891 Personal history of nicotine dependence: Secondary | ICD-10-CM | POA: Diagnosis not present

## 2022-01-04 DIAGNOSIS — I4819 Other persistent atrial fibrillation: Secondary | ICD-10-CM | POA: Diagnosis present

## 2022-01-04 DIAGNOSIS — Z96653 Presence of artificial knee joint, bilateral: Secondary | ICD-10-CM | POA: Diagnosis present

## 2022-01-04 DIAGNOSIS — Z8546 Personal history of malignant neoplasm of prostate: Secondary | ICD-10-CM | POA: Diagnosis not present

## 2022-01-04 DIAGNOSIS — I5032 Chronic diastolic (congestive) heart failure: Secondary | ICD-10-CM | POA: Diagnosis present

## 2022-01-04 DIAGNOSIS — I428 Other cardiomyopathies: Secondary | ICD-10-CM | POA: Diagnosis present

## 2022-01-04 DIAGNOSIS — G4730 Sleep apnea, unspecified: Secondary | ICD-10-CM | POA: Diagnosis present

## 2022-01-04 DIAGNOSIS — E785 Hyperlipidemia, unspecified: Secondary | ICD-10-CM | POA: Diagnosis present

## 2022-01-04 DIAGNOSIS — Z974 Presence of external hearing-aid: Secondary | ICD-10-CM | POA: Diagnosis not present

## 2022-01-04 LAB — CBC
HCT: 30 % — ABNORMAL LOW (ref 39.0–52.0)
HCT: 31.2 % — ABNORMAL LOW (ref 39.0–52.0)
Hemoglobin: 10 g/dL — ABNORMAL LOW (ref 13.0–17.0)
Hemoglobin: 10.2 g/dL — ABNORMAL LOW (ref 13.0–17.0)
MCH: 35.3 pg — ABNORMAL HIGH (ref 26.0–34.0)
MCH: 34.7 pg — ABNORMAL HIGH (ref 26.0–34.0)
MCHC: 33.3 g/dL (ref 30.0–36.0)
MCHC: 32.7 g/dL (ref 30.0–36.0)
MCV: 106 fL — ABNORMAL HIGH (ref 80.0–100.0)
MCV: 106.1 fL — ABNORMAL HIGH (ref 80.0–100.0)
Platelets: 134 10*3/uL — ABNORMAL LOW (ref 150–400)
Platelets: 136 10*3/uL — ABNORMAL LOW (ref 150–400)
RBC: 2.83 MIL/uL — ABNORMAL LOW (ref 4.22–5.81)
RBC: 2.94 MIL/uL — ABNORMAL LOW (ref 4.22–5.81)
RDW: 13.8 % (ref 11.5–15.5)
RDW: 13.8 % (ref 11.5–15.5)
WBC: 5.7 10*3/uL (ref 4.0–10.5)
WBC: 6.6 10*3/uL (ref 4.0–10.5)
nRBC: 0 % (ref 0.0–0.2)
nRBC: 0 % (ref 0.0–0.2)

## 2022-01-04 MED ORDER — CYANOCOBALAMIN 500 MCG PO TABS
500.0000 ug | ORAL_TABLET | Freq: Every evening | ORAL | Status: DC
Start: 2022-01-04 — End: 2022-01-05
  Administered 2022-01-04: 500 ug via ORAL
  Filled 2022-01-04 (×2): qty 1

## 2022-01-04 MED ORDER — SODIUM CHLORIDE 0.9 % IR SOLN
3000.0000 mL | Status: DC
  Administered 2022-01-04 (×8): 3000 mL

## 2022-01-04 MED ORDER — TAMSULOSIN HCL 0.4 MG PO CAPS
0.4000 mg | ORAL_CAPSULE | Freq: Every day | ORAL | Status: DC
  Administered 2022-01-04: 0.4 mg via ORAL
  Filled 2022-01-04: qty 1

## 2022-01-04 MED ORDER — ACETAMINOPHEN 325 MG PO TABS
650.0000 mg | ORAL_TABLET | Freq: Four times a day (QID) | ORAL | Status: DC | PRN
  Administered 2022-01-04 (×2): 650 mg via ORAL
  Filled 2022-01-04 (×2): qty 2

## 2022-01-04 MED ORDER — OXYBUTYNIN CHLORIDE 5 MG PO TABS
5.0000 mg | ORAL_TABLET | Freq: Three times a day (TID) | ORAL | Status: DC | PRN
  Administered 2022-01-04 – 2022-01-05 (×3): 5 mg via ORAL
  Filled 2022-01-04 (×3): qty 1

## 2022-01-04 MED ORDER — FESOTERODINE FUMARATE ER 4 MG PO TB24
4.0000 mg | ORAL_TABLET | Freq: Every day | ORAL | Status: DC
  Administered 2022-01-04 – 2022-01-05 (×2): 4 mg via ORAL
  Filled 2022-01-04 (×2): qty 1

## 2022-01-04 MED ORDER — METOPROLOL SUCCINATE ER 25 MG PO TB24
25.0000 mg | ORAL_TABLET | Freq: Two times a day (BID) | ORAL | Status: DC
  Administered 2022-01-04 – 2022-01-05 (×3): 25 mg via ORAL
  Filled 2022-01-04 (×3): qty 1

## 2022-01-04 MED ORDER — LISINOPRIL 20 MG PO TABS
40.0000 mg | ORAL_TABLET | Freq: Every day | ORAL | Status: DC
  Administered 2022-01-04: 40 mg via ORAL
  Filled 2022-01-04: qty 2

## 2022-01-04 MED ORDER — SODIUM CHLORIDE 0.9 % IV SOLN
2.0000 g | INTRAVENOUS | Status: DC
  Administered 2022-01-04: 2 g via INTRAVENOUS
  Filled 2022-01-04: qty 20

## 2022-01-04 MED ORDER — ATORVASTATIN CALCIUM 10 MG PO TABS
10.0000 mg | ORAL_TABLET | Freq: Every day | ORAL | Status: DC
  Administered 2022-01-04 – 2022-01-05 (×2): 10 mg via ORAL
  Filled 2022-01-04 (×2): qty 1

## 2022-01-04 MED ORDER — FINASTERIDE 5 MG PO TABS
5.0000 mg | ORAL_TABLET | Freq: Every morning | ORAL | Status: DC
  Administered 2022-01-04 – 2022-01-05 (×2): 5 mg via ORAL
  Filled 2022-01-04 (×2): qty 1

## 2022-01-04 MED ORDER — ACETAMINOPHEN 650 MG RE SUPP: 650.0000 mg | Freq: Four times a day (QID) | RECTAL | Status: DC | PRN

## 2022-01-04 NOTE — Progress Notes (Signed)
Subjective: Patient reports some bladder spasms.  Otherwise, no real complaints.  Objective: Vital signs in last 24 hours: Temp:  [97.6 F (36.4 C)-98.4 F (36.9 C)] 98.4 F (36.9 C) (09/01 1604) Pulse Rate:  [66-118] 92 (09/01 1604) Resp:  [12-22] 18 (09/01 1604) BP: (98-187)/(64-105) 122/71 (09/01 1604) SpO2:  [91 %-98 %] 96 % (09/01 1604)  Intake/Output from previous day: 08/31 0701 - 09/01 0700 In: 100 [IV Piggyback:100] Out: 900 [Urine:900] Intake/Output this shift: Total I/O In: -  Out: 20250 [Urine:20250]  Physical Exam:  Constitutional: Vital signs reviewed. WD WN in NAD   Eyes: PERRL, No scleral icterus.   Cardiovascular: RRR Pulmonary/Chest: Normal effort Extremities: No cyanosis or edema   Lab Results: Recent Labs    01/03/22 1853 01/04/22 0540 01/04/22 1208  HGB 12.7* 10.0* 10.2*  HCT 39.1 30.0* 31.2*   BMET Recent Labs    01/03/22 1853  NA 141  K 3.7  CL 109  CO2 25  GLUCOSE 119*  BUN 17  CREATININE 0.87  CALCIUM 9.0   Recent Labs    01/03/22 1853  INR 1.1   No results for input(s): "LABURIN" in the last 72 hours. Results for orders placed or performed during the hospital encounter of 12/03/21  Urine Culture     Status: None   Collection Time: 12/03/21  6:28 AM   Specimen: Urine, Clean Catch  Result Value Ref Range Status   Specimen Description   Final    URINE, CLEAN CATCH Performed at [facility], [address] [place], [place] [address]    Special Requests   Final    Normal Performed at [facility], [address] [place], [place] [address]    Culture   Final    NO GROWTH Performed at [facility], [place] [address] [place], [place] [address]    Report Status 12/04/2021 FINAL  Final   *Note: Due to a large number of results and/or encounters for the requested time period, some results have not been displayed. A complete set of results can be found in Results Review.     Studies/Results: CT Renal Stone Study  Result Date: 01/03/2022 CLINICAL DATA:  Urinary frequency. EXAM: CT ABDOMEN AND PELVIS WITHOUT CONTRAST TECHNIQUE: Multidetector CT imaging of the abdomen and pelvis was performed following the standard protocol without IV contrast. RADIATION DOSE REDUCTION: This exam was performed according to the departmental dose-optimization program which includes automated exposure control, adjustment of the mA and/or kV according to patient size and/or use of iterative reconstruction technique. COMPARISON:  December 03, 2021 FINDINGS: Lower chest: Mild linear atelectasis is seen within the left lung base. Hepatobiliary: 15 mm, 7 mm and 8 mm simple cysts are seen within the right lobe of the liver. No gallstones, gallbladder wall thickening, or biliary dilatation. Pancreas: Unremarkable. No pancreatic ductal dilatation or surrounding inflammatory changes. Spleen: Normal in size without focal abnormality. Adrenals/Urinary Tract: Adrenal glands are unremarkable. Kidneys are normal in size, without renal calculi or hydronephrosis. A 4.4 cm diameter cyst is seen within the anterolateral aspect of the mid left kidney. A Foley catheter is seen within the urinary bladder. Its distal tip extends into the anterior urinary bladder wall (axial CT image 65, CT series 2). A 5.4 cm x 3.7 cm x 3.5 cm well-defined area of increased attenuation (approximately 50.17 Hounsfield units) is seen within the dependent portion of the bladder lumen. This is mildly increased in size when compared to the  prior study. A small amount of air is seen along the anterior bladder wall. Stomach/Bowel: Stomach is within normal limits. Appendix appears normal. No evidence of bowel wall thickening, distention, or inflammatory changes. Noninflamed diverticula are seen within the descending and sigmoid colon. Vascular/Lymphatic: Aortic atherosclerosis. No enlarged abdominal or pelvic lymph nodes. Reproductive: The prostate  gland is surgically absent. Other: No abdominal wall hernia or abnormality. No abdominopelvic ascites. Musculoskeletal: A total right hip replacement is seen with associated streak artifact and subsequently limited evaluation of the adjacent osseous and soft tissue structures. Multilevel degenerative changes seen throughout the lumbar spine. IMPRESSION: 1. Findings likely consistent with a large blood clot within the urinary bladder lumen, mildly increased in size when compared to the prior study. Further evaluation with urology consultation and cystography is recommended, as an underlying neoplastic process cannot be excluded. 2. Colonic diverticulosis. 3. Status post prostatectomy. 4. Total right hip replacement. 5. Simple cyst within the left kidney (Bosniak 1). No additional follow-up or imaging is recommended. This recommendation follows ACR consensus guidelines: Management of the Incidental Renal Mass on CT: A White Paper of the ACR Incidental Findings Committee. J Am Coll Radiol [phone]. 6. Foley catheter positioning, as described above with a small amount of air along the anterior urinary bladder lumen. A small amount of air within the bladder wall cannot be excluded. 7. Aortic atherosclerosis. Aortic Atherosclerosis (ICD10-I70.0). Electronically Signed   By: Virgina Norfolk M.D.   On: 01/03/2022 21:48     I hand irrigated the patient's catheter which was on continuous bladder irrigation.  750 cc of saline was utilized.  Quite a few small clots were liberated.  Following irrigation, effluent was crystal-clear.  Assessment/Plan:  Gross hematuria, most likely secondary to radiation therapy.  Exacerbated by Xarelto which has now been discontinued.  He should clear fairly well now, having had the clots evacuated.  I will give him oxybutynin for his spasms  Follow-up this weekend by Dr. Abner Greenspan.  I discussed with him the fact that he may eventually need cystoscopy and cauterization of bladder  on an elective basis.   LOS: 0 days   Jorja Loa 01/04/2022, 6:03 PM

## 2022-01-04 NOTE — Consult Note (Addendum)
Consultation: gross hematuria  Requested by: Dr. Ezequiel Essex   History of Present Illness: Connor [name] is an 88 year old male with a history of TURP in 2020 when high-grade prostate cancer was noted on pathology.  He underwent long-term ADT and radiation.  His PSA has been low (undetectable Mar 2023) but has had recurrent gross hematuria over the past month. He had a catheter in July 2023 and required bladder irrigation and then passed a voiding trial.  He is on Xarelto for A-fib.  He has not had recurrence of hematuria until this evening when he was constipated and straining to have a bowel movement.  He then began develop clots and had clot retention.  Nursing placed a 70 [place] two-way catheter but could not keep it from occluding with clots with hand irrigation and urology was consulted.  CT scan of the abdomen and pelvis was obtained which was benign and showed a small clot or high dense material in the base of the bladder.  Past Medical History:  Diagnosis Date   Benign localized prostatic hyperplasia with lower urinary tract symptoms (LUTS)    urologist-- dr Diona Fanti   Chronic anticoagulation    Xarelto for afib   Chronic constipation    Chronic cystitis    Gross hematuria    History of DVT of lower extremity 06/08/2010   right femoral dvt dx post op total hip 05-08-2010   History of gastric ulcer    remote   History of kidney stones    one stone. remote hx   Hyperlipidemia    Hypertension    Dx by Dr. Lance Muss around age  47    NICM (nonischemic cardiomyopathy) ([place])    per echo 06-11-2017,  ef 45-50% with diffuse hypokinesis,  G1DD   OSA on CPAP 2017   compliant with CPAP.  managed by Dr Elsworth Soho.   Osteoarthritis    Other urethral stricture, male, meatal    chronic;  pt self dilates   Paroxysmal atrial fibrillation [facility]) cardiologist-- dr Rayann Heman   first dx 2009/   a. documented by Dr Barrett Shell on office EKG 9/12. b. maintained on Tikosyn and Xarelto. c. s/p DCCV 's.  d.  x3  EP  w/ ablation atrial fib last one 12-05-2016   Prediabetes    Premature ventricular contraction    PVC's (premature ventricular contractions)    RA (rheumatoid arthritis) ([place])    rheumatologist--- Dr Page Spiro,  treated with Humira   RBBB (right bundle branch block)    Hx of a fib, ablation Aug 2018   Rheumatoid arthritis ([place])    Vitamin D deficiency    Wears hearing aid in both ears    Past Surgical History:  Procedure Laterality Date   ABLATION OF DYSRHYTHMIC FOCUS  08/29/2015   ATRIAL FIBRILLATION ABLATION  12/05/2016   ATRIAL FIBRILLATION ABLATION N/A 12/05/2016   Procedure: Atrial Fibrillation Ablation;  Surgeon: [name], MD;  Location: [place] [facility];  Service: Cardiovascular;  Laterality: N/A;   ATRIAL FIBRILLATION ABLATION N/A 02/29/2020   Procedure: ATRIAL FIBRILLATION ABLATION;  Surgeon: [name], MD;  Location: [place] [facility];  Service: Cardiovascular;  Laterality: N/A;   ATRIAL FLUTTER ABLATION  09/2010   by Glenford Bayley Right 1990s   CARDIOVERSION N/A 10/28/2012   Procedure: CARDIOVERSION;  Surgeon: Burnell Blanks, MD;  Location: [place];  Service: Cardiovascular;  Laterality: N/A;   CATARACT EXTRACTION W/ INTRAOCULAR LENS  IMPLANT, BILATERAL  2019   CYSTOSCOPY WITH BIOPSY  N/A 09/03/2018   Procedure: CYSTOSCOPY WITH FULGURATION Fran Lowes;  Surgeon: Franchot Gallo, MD;  Location: [facility];  Service: Urology;  Laterality: N/A;  30 MINS   ELECTROPHYSIOLOGIC STUDY N/A 08/29/2015   Procedure: Atrial Fibrillation Ablation;  Surgeon: [name], MD;  Location: [place] [facility];  Service: Cardiovascular;  Laterality: N/A;   KNEE SURGERY Bilateral x3   1960s & 1970s   open   LEFT HEART CATHETERIZATION WITH CORONARY ANGIOGRAM N/A 06/07/2011   Procedure: LEFT HEART CATHETERIZATION WITH CORONARY ANGIOGRAM;  Surgeon: Peter M Martinique, MD;  Location: [facility] CATH [facility];  Service: Cardiovascular;  Laterality: N/A;    Normal coronary arteries,  low normal LVF    TONSILLECTOMY AND ADENOIDECTOMY  age 66   TOTAL HIP ARTHROPLASTY Right 06/04/10    dr Noemi Chapel  '@[facility]'$    TOTAL KNEE ARTHROPLASTY Bilateral right 1995;  left 1997   TRANSURETHRAL RESECTION OF PROSTATE  06-29-2002   dr humphries  '@[facility]'$    TRANSURETHRAL RESECTION OF PROSTATE N/A 07/06/2018   Procedure: TRANSURETHRAL RESECTION OF THE PROSTATE (TURP);  Surgeon: Franchot Gallo, MD;  Location[facility];  Service: Urology;  Laterality: N/A;  45 MINS    Home Medications:  (Not in a hospital admission)  Allergies:  Allergies  Allergen Reactions   Penicillins Itching, Swelling and Other (See Comments)    Did it involve swelling of the face/tongue/throat, SOB, or low BP? No Did it involve sudden or severe rash/hives, skin peeling, or any reaction on the inside of your mouth or nose? No Did you need to seek medical attention at a hospital or doctor's office? No When did it last happen?      30 + years If all above answers are "NO", may proceed with cephalosporin use.      Family History  Problem Relation Age of Onset   Heart attack Father    Lung disease Mother    Arrhythmia Sister    Atrial fibrillation Brother    Diabetes Neg Hx    Social History:  reports that he quit smoking about 43 years ago. His smoking use included cigarettes. He has a 30.00 pack-year smoking history. He has never used smokeless tobacco. He reports current alcohol use. He reports that he does not use drugs.  ROS: A complete review of systems was performed.  All systems are negative except for pertinent findings as noted. Review of Systems  All other systems reviewed and are negative.    Physical Exam:  Vital signs in last 24 hours: Temp:  [97.7 F (36.5 C)-98.1 F (36.7 C)] 97.7 F (36.5 C) (09/01 0051) Pulse Rate:  [82-118] 88 (09/01 0115) Resp:  [15-20] 15 (09/01 0115) BP: (142-187)/(73-105) 187/105 (09/01 0115) SpO2:  [93 %-98 %] 97 % (09/01 0115) General:  Alert and oriented, No acute  distress HEENT: Normocephalic, atraumatic Cardiovascular: Regular rate and rhythm Lungs: Regular rate and effort Abdomen: Soft, nontender, nondistended, no abdominal masses Back: No CVA tenderness Extremities: No edema Neurologic: Grossly intact GU: 22 French Foley in place.  Urine grossly bloody.  Procedure: The catheter at a 30 cc balloon and it looked to have 30 cc in it on the CT.  There for I took out about 20 cc.  The catheter then irrigated normally with equal return.  I irrigated out small early clots until he was light red.  I could never get him quite to pink or clear and then clots quickly reform.  Therefore the 22 French catheter balloon was deflated and  it was removed.  He was then prepped and draped again in the usual sterile fashion and a 20 [place] three-way hematuria catheter was placed.  I then irrigated several small clots and started CBI.  He remained clear to pink to Kool-Aid on CBI.  Irrigation revealed no further clots.  He initially was oozing a small amount of blood from around the catheter and I placed a washcloth there and this then subsided.  I then hand irrigated the catheter and again continued to get equal return and no new clots were noted.  Laboratory Data:  Results for orders placed or performed during the hospital encounter of 01/03/22 (from the past 24 hour(s))  Urinalysis, Routine w reflex microscopic Urine, Clean Catch     Status: Abnormal   Collection Time: 01/03/22  6:41 PM  Result Value Ref Range   Color, Urine RED (A) YELLOW   APPearance (A) CLEAR    TEST NOT REPORTED DUE TO COLOR INTERFERENCE OF URINE PIGMENT   Specific Gravity, Urine 1.021 1.005 - 1.030   pH  5.0 - 8.0    TEST NOT REPORTED DUE TO COLOR INTERFERENCE OF URINE PIGMENT   Glucose, UA (A) NEGATIVE mg/dL    TEST NOT REPORTED DUE TO COLOR INTERFERENCE OF URINE PIGMENT   Hgb urine dipstick (A) NEGATIVE    TEST NOT REPORTED DUE TO COLOR INTERFERENCE OF URINE PIGMENT   Bilirubin Urine (A)  NEGATIVE    TEST NOT REPORTED DUE TO COLOR INTERFERENCE OF URINE PIGMENT   Ketones, ur (A) NEGATIVE mg/dL    TEST NOT REPORTED DUE TO COLOR INTERFERENCE OF URINE PIGMENT   Protein, ur (A) NEGATIVE mg/dL    TEST NOT REPORTED DUE TO COLOR INTERFERENCE OF URINE PIGMENT   Nitrite (A) NEGATIVE    TEST NOT REPORTED DUE TO COLOR INTERFERENCE OF URINE PIGMENT   Leukocytes,Ua (A) NEGATIVE    TEST NOT REPORTED DUE TO COLOR INTERFERENCE OF URINE PIGMENT  Comprehensive metabolic panel     Status: Abnormal   Collection Time: 01/03/22  6:53 PM  Result Value Ref Range   Sodium 141 135 - 145 mmol/L   Potassium 3.7 3.5 - 5.1 mmol/L   Chloride 109 98 - 111 mmol/L   CO2 25 22 - 32 mmol/L   Glucose, Bld 119 (H) 70 - 99 mg/dL   BUN 17 8 - 23 mg/dL   Creatinine, Ser 0.87 0.61 - 1.24 mg/dL   Calcium 9.0 8.9 - 10.3 mg/dL   Total Protein 7.2 6.5 - 8.1 g/dL   Albumin 4.1 3.5 - 5.0 g/dL   AST 20 15 - 41 U/L   ALT 18 0 - 44 U/L   Alkaline Phosphatase 64 38 - 126 U/L   Total Bilirubin 0.9 0.3 - 1.2 mg/dL   GFR, Estimated >60 >60 mL/min   Anion gap 7 5 - 15  CBC with Differential     Status: Abnormal   Collection Time: 01/03/22  6:53 PM  Result Value Ref Range   WBC 5.2 4.0 - 10.5 K/uL   RBC 3.66 (L) 4.22 - 5.81 MIL/uL   Hemoglobin 12.7 (L) 13.0 - 17.0 g/dL   HCT 39.1 39.0 - 52.0 %   MCV 106.8 (H) 80.0 - 100.0 fL   MCH 34.7 (H) 26.0 - 34.0 pg   MCHC 32.5 30.0 - 36.0 g/dL   RDW 13.8 11.5 - 15.5 %   Platelets 177 150 - 400 K/uL   nRBC 0.0 0.0 - 0.2 %   Neutrophils  Relative % 68 %   Neutro Abs 3.6 1.7 - 7.7 K/uL   Lymphocytes Relative 19 %   Lymphs Abs 1.0 0.7 - 4.0 K/uL   Monocytes Relative 11 %   Monocytes Absolute 0.6 0.1 - 1.0 K/uL   Eosinophils Relative 1 %   Eosinophils Absolute 0.1 0.0 - 0.5 K/uL   Basophils Relative 0 %   Basophils Absolute 0.0 0.0 - 0.1 K/uL   Immature Granulocytes 1 %   Abs Immature Granulocytes 0.03 0.00 - 0.07 K/uL  Protime-INR     Status: None   Collection Time:  01/03/22  6:53 PM  Result Value Ref Range   Prothrombin Time 13.9 11.4 - 15.2 seconds   INR 1.1 0.8 - 1.2   *Note: Due to a large number of results and/or encounters for the requested time period, some results have not been displayed. A complete set of results can be found in Results Review.   No results found for this or any previous visit (from the past 240 hour(s)). Creatinine: Recent Labs    01/03/22 1853  CREATININE 0.87   CT images reviewed and office chart reviewed   Impression/Assessment/plan:  Gross hematuria-likely from prostate or base of bladder bleeding and patient with history of TURP followed by external beam radiation.  Continue CBI and titrate to keep the urine light pink.  I will notify Dr. Diona Fanti of his admission.  I did discuss the patient with his daughter Amy over the phone as she called in while we were irrigating.   Appreciate TRH assistance.   Festus Aloe 01/04/2022, 1:34 AM

## 2022-01-04 NOTE — Progress Notes (Signed)
Subjective: Denies pain, no nausea or emesis.  Objective: Vital signs in last 24 hours: Temp:  [97.6 F (36.4 C)-98.7 F (37.1 C)] 98.7 F (37.1 C) (09/02 0459) Pulse Rate:  [70-95] 95 (09/02 0459) Resp:  [15-20] 18 (09/02 0459) BP: (98-126)/(57-77) 98/57 (09/02 0459) SpO2:  [93 %-97 %] 97 % (09/02 0459) Weight:  [100.7 kg] 100.7 kg (09/01 2302)  Intake/Output from previous day: 09/01 0701 - 09/02 0700 In: 3340 [P.O.:240; IV Piggyback:100] Out: 62831 [Urine:27650] Intake/Output this shift: Total I/O In: 3340 [P.O.:240; Other:3000; IV Piggyback:100] Out: 4700 [Urine:4700]   Physical Exam:  General: Alert and oriented CV: RRR Lungs: Clear Abdomen: Soft, ND, NT GU: Foley draining clear yellow urine Ext: NT, No erythema  Lab Results: Recent Labs    01/03/22 1853 01/04/22 0540 01/04/22 1208  HGB 12.7* 10.0* 10.2*  HCT 39.1 30.0* 31.2*   BMET Recent Labs    01/03/22 1853 01/05/22 0353  NA 141 142  K 3.7 3.6  CL 109 108  CO2 25 29  GLUCOSE 119* 113*  BUN 17 12  CREATININE 0.87 0.71  CALCIUM 9.0 7.9*     Studies/Results: CT Renal Stone Study  Result Date: 01/03/2022 CLINICAL DATA:  Urinary frequency. EXAM: CT ABDOMEN AND PELVIS WITHOUT CONTRAST TECHNIQUE: Multidetector CT imaging of the abdomen and pelvis was performed following the standard protocol without IV contrast. RADIATION DOSE REDUCTION: This exam was performed according to the departmental dose-optimization program which includes automated exposure control, adjustment of the mA and/or kV according to patient size and/or use of iterative reconstruction technique. COMPARISON:  December 03, 2021 FINDINGS: Lower chest: Mild linear atelectasis is seen within the left lung base. Hepatobiliary: 15 mm, 7 mm and 8 mm simple cysts are seen within the right lobe of the liver. No gallstones, gallbladder wall thickening, or biliary dilatation. Pancreas: Unremarkable. No pancreatic ductal dilatation or surrounding  inflammatory changes. Spleen: Normal in size without focal abnormality. Adrenals/Urinary Tract: Adrenal glands are unremarkable. Kidneys are normal in size, without renal calculi or hydronephrosis. A 4.4 cm diameter cyst is seen within the anterolateral aspect of the mid left kidney. A Foley catheter is seen within the urinary bladder. Its distal tip extends into the anterior urinary bladder wall (axial CT image 65, CT series 2). A 5.4 cm x 3.7 cm x 3.5 cm well-defined area of increased attenuation (approximately 50.17 Hounsfield units) is seen within the dependent portion of the bladder lumen. This is mildly increased in size when compared to the prior study. A small amount of air is seen along the anterior bladder wall. Stomach/Bowel: Stomach is within normal limits. Appendix appears normal. No evidence of bowel wall thickening, distention, or inflammatory changes. Noninflamed diverticula are seen within the descending and sigmoid colon. Vascular/Lymphatic: Aortic atherosclerosis. No enlarged abdominal or pelvic lymph nodes. Reproductive: The prostate gland is surgically absent. Other: No abdominal wall hernia or abnormality. No abdominopelvic ascites. Musculoskeletal: A total right hip replacement is seen with associated streak artifact and subsequently limited evaluation of the adjacent osseous and soft tissue structures. Multilevel degenerative changes seen throughout the lumbar spine. IMPRESSION: 1. Findings likely consistent with a large blood clot within the urinary bladder lumen, mildly increased in size when compared to the prior study. Further evaluation with urology consultation and cystography is recommended, as an underlying neoplastic process cannot be excluded. 2. Colonic diverticulosis. 3. Status post prostatectomy. 4. Total right hip replacement. 5. Simple cyst within the left kidney (Bosniak 1). No additional follow-up or imaging is recommended.  This recommendation follows ACR consensus  guidelines: Management of the Incidental Renal Mass on CT: A White Paper of the ACR Incidental Findings Committee. J Am Coll Radiol [phone]. 6. Foley catheter positioning, as described above with a small amount of air along the anterior urinary bladder lumen. A small amount of air within the bladder wall cannot be excluded. 7. Aortic atherosclerosis. Aortic Atherosclerosis (ICD10-I70.0). Electronically Signed   By: Virgina Norfolk M.D.   On: 01/03/2022 21:48    Assessment/Plan: Gross hematuria-likely from prostate or base of bladder bleeding and patient with history of TURP followed by external beam radiation.    -Urine clear yellow with CBI clamped -Ok to discharge home with foley catheter in place. He has followup arranged for next week    LOS: 1 day   Matt R. Trisha Morandi MD 01/05/2022, 6:29 AM [facility]  Pager: [phone]

## 2022-01-04 NOTE — ED Notes (Signed)
Urology MD at bedside conversing with pt.

## 2022-01-04 NOTE — Progress Notes (Signed)
Patient states that he does not wear cpap at home and does not want to wear it here

## 2022-01-04 NOTE — H&P (Signed)
History and Physical    Connor [name] QIO:[id number] DOB: [birth year] DOA: 01/03/2022  PCP: Darreld Mclean, MD  Patient coming from: Home.  Chief Complaint: Blood in the urine.  HPI: Connor [name] is a 88 y.o. male with history of atrial fibrillation status post ablation presently on Xarelto, history of prostate cancer, hypertension, CHF presents to the ER after patient started having hematuria after straining during bowel movement.  Recently patient has been having recurrent hematuria had Foley catheter placed and had irrigation done last month following which patient's Foley catheter was eventually removed after voiding trial.  ED Course: In the ER CT scan of the abdomen pelvis shows clot in the urinary bladder.  Foley catheter was placed but patient started having persistent hematuria.  Urologist was consulted and patient was started on continuous irrigation.  Admitted for further observation.  Review of Systems: As per HPI, rest all negative.   Past Medical History:  Diagnosis Date   Benign localized prostatic hyperplasia with lower urinary tract symptoms (LUTS)    urologist-- dr Diona Fanti   Chronic anticoagulation    Xarelto for afib   Chronic constipation    Chronic cystitis    Gross hematuria    History of DVT of lower extremity 06/08/2010   right femoral dvt dx post op total hip 05-08-2010   History of gastric ulcer    remote   History of kidney stones    one stone. remote hx   Hyperlipidemia    Hypertension    Dx by Dr. Lance Muss around age  3    NICM (nonischemic cardiomyopathy) ([place])    per echo 06-11-2017,  ef 45-50% with diffuse hypokinesis,  G1DD   OSA on CPAP 2017   compliant with CPAP.  managed by Dr Elsworth Soho.   Osteoarthritis    Other urethral stricture, male, meatal    chronic;  pt self dilates   Paroxysmal atrial fibrillation [facility]) cardiologist-- dr Rayann Heman   first dx 2009/   a. documented by Dr Barrett Shell on office EKG 9/12. b. maintained on Tikosyn  and Xarelto. c. s/p DCCV 's.  d.  x3  EP w/ ablation atrial fib last one 12-05-2016   Prediabetes    Premature ventricular contraction    PVC's (premature ventricular contractions)    RA (rheumatoid arthritis) ([place])    rheumatologist--- Dr Page Spiro,  treated with Humira   RBBB (right bundle branch block)    Hx of a fib, ablation Aug 2018   Rheumatoid arthritis ([place])    Vitamin D deficiency    Wears hearing aid in both ears     Past Surgical History:  Procedure Laterality Date   ABLATION OF DYSRHYTHMIC FOCUS  08/29/2015   ATRIAL FIBRILLATION ABLATION  12/05/2016   ATRIAL FIBRILLATION ABLATION N/A 12/05/2016   Procedure: Atrial Fibrillation Ablation;  Surgeon: [name], MD;  Location: [place] [facility];  Service: Cardiovascular;  Laterality: N/A;   ATRIAL FIBRILLATION ABLATION N/A 02/29/2020   Procedure: ATRIAL FIBRILLATION ABLATION;  Surgeon: [name], MD;  Location: [place] [facility];  Service: Cardiovascular;  Laterality: N/A;   ATRIAL FLUTTER ABLATION  09/2010   by [place]ford Bayley Right 1990s   CARDIOVERSION N/A 10/28/2012   Procedure: CARDIOVERSION;  Surgeon: Burnell Blanks, MD;  Location: [place];  Service: Cardiovascular;  Laterality: N/A;   CATARACT EXTRACTION W/ INTRAOCULAR LENS  IMPLANT, BILATERAL  2019   CYSTOSCOPY WITH BIOPSY N/A 09/03/2018   Procedure: CYSTOSCOPY WITH FULGURATION  [email or web address];  Surgeon: Franchot Gallo, MD;  Location: [facility];  Service: Urology;  Laterality: N/A;  30 MINS   ELECTROPHYSIOLOGIC STUDY N/A 08/29/2015   Procedure: Atrial Fibrillation Ablation;  Surgeon: [name], MD;  Location: [place] [facility];  Service: Cardiovascular;  Laterality: N/A;   KNEE SURGERY Bilateral x3   1960s & 1970s   open   LEFT HEART CATHETERIZATION WITH CORONARY ANGIOGRAM N/A 06/07/2011   Procedure: LEFT HEART CATHETERIZATION WITH CORONARY ANGIOGRAM;  Surgeon: Peter M Martinique, MD;  Location: [facility] CATH LAB;  Service: Cardiovascular;  Laterality: N/A;     Normal coronary arteries,  low normal LVF   TONSILLECTOMY AND ADENOIDECTOMY  age 71   TOTAL HIP ARTHROPLASTY Right 06/04/10    dr Noemi Chapel  '@[facility]'$    TOTAL KNEE ARTHROPLASTY Bilateral right 1995;  left 1997   TRANSURETHRAL RESECTION OF PROSTATE  06-29-2002   dr humphries  '@[facility]'$    TRANSURETHRAL RESECTION OF PROSTATE N/A 07/06/2018   Procedure: TRANSURETHRAL RESECTION OF THE PROSTATE (TURP);  Surgeon: Franchot Gallo, MD;  Location: [facility];  Service: Urology;  Laterality: N/A;  48 MINS     reports that he quit smoking about 43 years ago. His smoking use included cigarettes. He has a 30.00 pack-year smoking history. He has never used smokeless tobacco. He reports current alcohol use. He reports that he does not use drugs.  Allergies  Allergen Reactions   Penicillins Itching, Swelling and Other (See Comments)    Did it involve swelling of the face/tongue/throat, SOB, or low BP? No Did it involve sudden or severe rash/hives, skin peeling, or any reaction on the inside of your mouth or nose? No Did you need to seek medical attention at a hospital or doctor's office? No When did it last happen?      30 + years If all above answers are "NO", may proceed with cephalosporin use.      Family History  Problem Relation Age of Onset   Heart attack Father    Lung disease Mother    Arrhythmia Sister    Atrial fibrillation Brother    Diabetes Neg Hx     Prior to Admission medications   Medication Sig Start Date End Date Taking? Authorizing Provider  acetaminophen (TYLENOL) 500 MG tablet Take 1,000 mg by mouth 2 (two) times daily as needed for moderate pain.   Yes [provider]  atorvastatin (LIPITOR) 10 MG tablet TAKE 1 TABLET BY MOUTH EVERY DAY Patient taking differently: Take 10 mg by mouth daily. 10/29/21  Yes Copland, Gay Filler, MD  calcium carbonate (TUMS - DOSED IN MG ELEMENTAL CALCIUM) 500 MG chewable tablet Chew 1 tablet by mouth daily as needed for heartburn.   Yes  [provider]  Carboxymethylcellulose Sodium (THERATEARS OP) Apply 1 drop to eye daily as needed (dry eyes).   Yes [provider]  Cholecalciferol (VITAMIN D3) 5000 UNITS TABS Take 5,000 Units by mouth daily.   Yes [provider]  Cranberry 1000 MG CAPS Take 3,000 mg by mouth at bedtime.   Yes [provider]  Docusate Calcium (STOOL SOFTENER PO) Take 4 tablets by mouth at bedtime.   Yes [provider]  finasteride (PROSCAR) 5 MG tablet Take 5 mg by mouth every morning.  03/11/18  Yes [provider]  HUMIRA PEN 40 MG/0.4ML PNKT Inject 40 mg as directed every 7 (seven) days. Every Tuesday 03/26/18  Yes [provider]  lisinopril (ZESTRIL) 40 MG tablet TAKE 1 TABLET  BY MOUTH EVERY DAY Patient taking differently: Take 40 mg by mouth at bedtime. 07/02/21  Yes Copland, Gay Filler, MD  metoprolol succinate (TOPROL-XL) 50 MG 24 hr tablet TAKE 1/2 TABLET IN THE MORNING AND TAKE 1 TABLET IN THE EVENING Patient taking differently: Take 25-50 mg by mouth See admin instructions. Take 1/2 tablet by mouth in the morning and 1 tablet in the evening 09/05/21  Yes Fenton, Clint R, PA  Multiple Vitamins-Minerals (PRESERVISION AREDS 2) CAPS Take 1 capsule by mouth 2 (two) times a day.   Yes [provider]  omeprazole (PRILOSEC) 20 MG capsule Take 20 mg by mouth daily.   Yes Darlin Coco, MD  solifenacin (VESICARE) 10 MG tablet Take 10 mg by mouth at bedtime.    Yes [provider]  tamsulosin (FLOMAX) 0.4 MG CAPS capsule Take 1 capsule (0.4 mg total) by mouth daily after supper. 12/25/18  Yes Tyler Pita, MD  Testosterone 20.25 MG/ACT (1.62%) GEL Apply 1 Pump topically at bedtime. Apply to shoulders 09/01/21  Yes [provider]  vitamin B-12 (CYANOCOBALAMIN) 500 MCG tablet Take 500 mcg by mouth every evening.    Yes [provider]  XARELTO 20 MG TABS tablet TAKE 1 TABLET BY MOUTH DAILY WITH SUPPER. 11/29/21   Yes Fenton, Clint R, PA    Physical Exam: Constitutional: Moderately built and nourished. Vitals:   01/04/22 0245 01/04/22 0315 01/04/22 0330 01/04/22 0345  BP: 117/66 137/79 124/73 132/68  Pulse: 74 79 72 71  Resp: 15 16 (!) 22 19  Temp:      TempSrc:      SpO2: 96% 95% 92% 93%   Eyes: Anicteric no pallor. ENMT: No discharge from the ears eyes nose and mouth. Neck: No mass felt.  No neck rigidity. Respiratory: No rhonchi or crepitations. Cardiovascular: S1-S2 heard. Abdomen: Soft nontender bowel sounds present. Musculoskeletal: Bilateral lower extremity nonpitting edema. Skin: No rash. Neurologic: Alert awake oriented to time place and person.  Moves all extremities. Psychiatric: Appears normal.  Normal affect.   Labs on Admission: I have personally reviewed following labs and imaging studies  CBC: Recent Labs  Lab 01/03/22 1853  WBC 5.2  NEUTROABS 3.6  HGB 12.7*  HCT 39.1  MCV 106.8*  PLT 258   Basic Metabolic Panel: Recent Labs  Lab 01/03/22 1853  NA 141  K 3.7  CL 109  CO2 25  GLUCOSE 119*  BUN 17  CREATININE 0.87  CALCIUM 9.0   GFR: CrCl cannot be calculated (Unknown ideal weight.). Liver Function Tests: Recent Labs  Lab 01/03/22 1853  AST 20  ALT 18  ALKPHOS 64  BILITOT 0.9  PROT 7.2  ALBUMIN 4.1   No results for input(s): "LIPASE", "AMYLASE" in the last 168 hours. No results for input(s): "AMMONIA" in the last 168 hours. Coagulation Profile: Recent Labs  Lab 01/03/22 1853  INR 1.1   Cardiac Enzymes: No results for input(s): "CKTOTAL", "CKMB", "CKMBINDEX", "TROPONINI" in the last 168 hours. BNP (last 3 results) Recent Labs    03/12/21 1004  PROBNP 59.0   HbA1C: No results for input(s): "HGBA1C" in the last 72 hours. CBG: No results for input(s): "GLUCAP" in the last 168 hours. Lipid Profile: No results for input(s): "CHOL", "HDL", "LDLCALC", "TRIG", "CHOLHDL", "LDLDIRECT" in the last 72 hours. Thyroid Function Tests: No  results for input(s): "TSH", "T4TOTAL", "FREET4", "T3FREE", "THYROIDAB" in the last 72 hours. Anemia Panel: No results for input(s): "VITAMINB12", "FOLATE", "FERRITIN", "TIBC", "IRON", "RETICCTPCT" in the last 72  hours. Urine analysis:    Component Value Date/Time   COLORURINE RED (A) 01/03/2022 1841   APPEARANCEUR (A) 01/03/2022 1841    TEST NOT REPORTED DUE TO COLOR INTERFERENCE OF URINE PIGMENT   LABSPEC 1.021 01/03/2022 1841   PHURINE  01/03/2022 1841    TEST NOT REPORTED DUE TO COLOR INTERFERENCE OF URINE PIGMENT   GLUCOSEU (A) 01/03/2022 1841    TEST NOT REPORTED DUE TO COLOR INTERFERENCE OF URINE PIGMENT   GLUCOSEU Color Interference (A) 08/13/2021 1003   HGBUR (A) 01/03/2022 1841    TEST NOT REPORTED DUE TO COLOR INTERFERENCE OF URINE PIGMENT   BILIRUBINUR (A) 01/03/2022 1841    TEST NOT REPORTED DUE TO COLOR INTERFERENCE OF URINE PIGMENT   BILIRUBINUR negative 08/16/2019 0922   BILIRUBINUR Neg 09/20/2014 1127   KETONESUR (A) 01/03/2022 1841    TEST NOT REPORTED DUE TO COLOR INTERFERENCE OF URINE PIGMENT   PROTEINUR (A) 01/03/2022 1841    TEST NOT REPORTED DUE TO COLOR INTERFERENCE OF URINE PIGMENT   UROBILINOGEN Color Interference (A) 08/13/2021 1003   NITRITE (A) 01/03/2022 1841    TEST NOT REPORTED DUE TO COLOR INTERFERENCE OF URINE PIGMENT   LEUKOCYTESUR (A) 01/03/2022 1841    TEST NOT REPORTED DUE TO COLOR INTERFERENCE OF URINE PIGMENT   Sepsis Labs: '@LABRCNTIP'$ (procalcitonin:4,lacticidven:4) )No results found for this or any previous visit (from the past 240 hour(s)).   Radiological Exams on Admission: CT Renal Stone Study  Result Date: 01/03/2022 CLINICAL DATA:  Urinary frequency. EXAM: CT ABDOMEN AND PELVIS WITHOUT CONTRAST TECHNIQUE: Multidetector CT imaging of the abdomen and pelvis was performed following the standard protocol without IV contrast. RADIATION DOSE REDUCTION: This exam was performed according to the departmental dose-optimization program which  includes automated exposure control, adjustment of the mA and/or kV according to patient size and/or use of iterative reconstruction technique. COMPARISON:  December 03, 2021 FINDINGS: Lower chest: Mild linear atelectasis is seen within the left lung base. Hepatobiliary: 15 mm, 7 mm and 8 mm simple cysts are seen within the right lobe of the liver. No gallstones, gallbladder wall thickening, or biliary dilatation. Pancreas: Unremarkable. No pancreatic ductal dilatation or surrounding inflammatory changes. Spleen: Normal in size without focal abnormality. Adrenals/Urinary Tract: Adrenal glands are unremarkable. Kidneys are normal in size, without renal calculi or hydronephrosis. A 4.4 cm diameter cyst is seen within the anterolateral aspect of the mid left kidney. A Foley catheter is seen within the urinary bladder. Its distal tip extends into the anterior urinary bladder wall (axial CT image 65, CT series 2). A 5.4 cm x 3.7 cm x 3.5 cm well-defined area of increased attenuation (approximately 50.17 Hounsfield units) is seen within the dependent portion of the bladder lumen. This is mildly increased in size when compared to the prior study. A small amount of air is seen along the anterior bladder wall. Stomach/Bowel: Stomach is within normal limits. Appendix appears normal. No evidence of bowel wall thickening, distention, or inflammatory changes. Noninflamed diverticula are seen within the descending and sigmoid colon. Vascular/Lymphatic: Aortic atherosclerosis. No enlarged abdominal or pelvic lymph nodes. Reproductive: The prostate gland is surgically absent. Other: No abdominal wall hernia or abnormality. No abdominopelvic ascites. Musculoskeletal: A total right hip replacement is seen with associated streak artifact and subsequently limited evaluation of the adjacent osseous and soft tissue structures. Multilevel degenerative changes seen throughout the lumbar spine. IMPRESSION: 1. Findings likely consistent with a  large blood clot within the urinary bladder lumen, mildly increased in size when  compared to the prior study. Further evaluation with urology consultation and cystography is recommended, as an underlying neoplastic process cannot be excluded. 2. Colonic diverticulosis. 3. Status post prostatectomy. 4. Total right hip replacement. 5. Simple cyst within the left kidney (Bosniak 1). No additional follow-up or imaging is recommended. This recommendation follows ACR consensus guidelines: Management of the Incidental Renal Mass on CT: A White Paper of the ACR Incidental Findings Committee. J Am Coll Radiol [phone]. 6. Foley catheter positioning, as described above with a small amount of air along the anterior urinary bladder lumen. A small amount of air within the bladder wall cannot be excluded. 7. Aortic atherosclerosis. Aortic Atherosclerosis (ICD10-I70.0). Electronically Signed   By: Virgina Norfolk M.D.   On: 01/03/2022 21:48     Assessment/Plan Principal Problem:   Hematuria Active Problems:   h/o Atrial flutter [facility]) s/p ablation 2012   Hypertension   OSA (obstructive sleep apnea)   Malignant neoplasm of prostate ([facility]CC)    Hematuria -appreciate urology consult.  Patient has been started on continuous bladder irrigation.  Holding Xarelto.  Patient agreeable to holding Xarelto. A-fib presently rate controlled in sinus rhythm.  Holding Xarelto due to hematuria patient agreeable.  Continue Toprol. Patient on lisinopril and Toprol. History of diastolic CHF appears compensated. History of sleep apnea on CPAP. History of prostate cancer being followed by urologist.   DVT prophylaxis: SCDs.  Avoiding anticoagulation in the setting of hematuria. Code Status: Full code. Family Communication: Discussed with patient. Disposition Plan: Home. Consults called: Urology. Admission status: Observation.   Rise Patience MD [facility] Pager [phone].  If 7PM-7AM, please  contact night-coverage [email or web address] Password [email or web address]  01/04/2022, 5:20 AM

## 2022-01-04 NOTE — Progress Notes (Signed)
PROGRESS NOTE    Connor [name]  URK:[id number] DOB: Sep 24, 1933 DOA: 01/03/2022 PCP: Darreld Mclean, MD  Outpatient Specialists:     Brief Narrative:  Patient was admitted earlier today with gross hematuria.  Urology input is appreciated.  Patient is on continuous bladder irrigation.  Urology team is directing care.  No new complaints today.  As per H&P done today: "Connor [name] is a 88 y.o. male with history of atrial fibrillation status post ablation presently on Xarelto, history of prostate cancer, hypertension, CHF presents to the ER after patient started having hematuria after straining during bowel movement.  Recently patient has been having recurrent hematuria had Foley catheter placed and had irrigation done last month following which patient's Foley catheter was eventually removed after voiding trial.   ED Course: In the ER CT scan of the abdomen pelvis shows clot in the urinary bladder.  Foley catheter was placed but patient started having persistent hematuria.  Urologist was consulted and patient was started on continuous irrigation.  Admitted for further observation".     Assessment & Plan:   Principal Problem:   Hematuria Active Problems:   h/o Atrial flutter ([place]) s/p ablation 2012   Hypertension   OSA (obstructive sleep apnea)   Malignant neoplasm of prostate (HCC)   DVT prophylaxis: Anticoagulation is on hold. Code Status: Full code Family Communication:  Disposition Plan: Once cleared by urology team.   Consultants:  Urology  Procedures:  Continue his blood diet irrigation.  Antimicrobials:  Rocephin.   Objective: Vitals:   01/04/22 0615 01/04/22 0630 01/04/22 0645 01/04/22 0700  BP: 130/81 118/67 114/69 126/77  Pulse: 71 70 72 87  Resp: '17 19 20 18  '$ Temp:      TempSrc:      SpO2: 92% 94% 93% 97%    Intake/Output Summary (Last 24 hours) at 01/04/2022 0829 Last data filed at 01/04/2022 0818 Gross per 24 hour  Intake 100 ml   Output 1400 ml  Net -1300 ml   There were no vitals filed for this visit.  Examination:  General exam: Appears calm and comfortable.  Patient is pale. Respiratory system: Clear to auscultation.  Cardiovascular system: S1 & S2, irregular.   Gastrointestinal system: Abdomen is obese, soft and nontender.   Central nervous system: Awake and alert.  Moves all EXTR.   Extremities: Fullness of the ankle.  Data Reviewed: I have personally reviewed following labs and imaging studies  CBC: Recent Labs  Lab 01/03/22 1853 01/04/22 0540  WBC 5.2 5.7  NEUTROABS 3.6  --   HGB 12.7* 10.0*  HCT 39.1 30.0*  MCV 106.8* 106.0*  PLT 177 831*   Basic Metabolic Panel: Recent Labs  Lab 01/03/22 1853  NA 141  K 3.7  CL 109  CO2 25  GLUCOSE 119*  BUN 17  CREATININE 0.87  CALCIUM 9.0   GFR: CrCl cannot be calculated (Unknown ideal weight.). Liver Function Tests: Recent Labs  Lab 01/03/22 1853  AST 20  ALT 18  ALKPHOS 64  BILITOT 0.9  PROT 7.2  ALBUMIN 4.1   No results for input(s): "LIPASE", "AMYLASE" in the last 168 hours. No results for input(s): "AMMONIA" in the last 168 hours. Coagulation Profile: Recent Labs  Lab 01/03/22 1853  INR 1.1   Cardiac Enzymes: No results for input(s): "CKTOTAL", "CKMB", "CKMBINDEX", "TROPONINI" in the last 168 hours. BNP (last 3 results) Recent Labs    03/12/21 1004  PROBNP 59.0   HbA1C: No results  for input(s): "HGBA1C" in the last 72 hours. CBG: No results for input(s): "GLUCAP" in the last 168 hours. Lipid Profile: No results for input(s): "CHOL", "HDL", "LDLCALC", "TRIG", "CHOLHDL", "LDLDIRECT" in the last 72 hours. Thyroid Function Tests: No results for input(s): "TSH", "T4TOTAL", "FREET4", "T3FREE", "THYROIDAB" in the last 72 hours. Anemia Panel: No results for input(s): "VITAMINB12", "FOLATE", "FERRITIN", "TIBC", "IRON", "RETICCTPCT" in the last 72 hours. Urine analysis:    Component Value Date/Time   COLORURINE RED (A)  01/03/2022 1841   APPEARANCEUR (A) 01/03/2022 1841    TEST NOT REPORTED DUE TO COLOR INTERFERENCE OF URINE PIGMENT   LABSPEC 1.021 01/03/2022 1841   PHURINE  01/03/2022 1841    TEST NOT REPORTED DUE TO COLOR INTERFERENCE OF URINE PIGMENT   GLUCOSEU (A) 01/03/2022 1841    TEST NOT REPORTED DUE TO COLOR INTERFERENCE OF URINE PIGMENT   GLUCOSEU Color Interference (A) 08/13/2021 1003   HGBUR (A) 01/03/2022 1841    TEST NOT REPORTED DUE TO COLOR INTERFERENCE OF URINE PIGMENT   BILIRUBINUR (A) 01/03/2022 1841    TEST NOT REPORTED DUE TO COLOR INTERFERENCE OF URINE PIGMENT   BILIRUBINUR negative 08/16/2019 0922   BILIRUBINUR Neg 09/20/2014 1127   KETONESUR (A) 01/03/2022 1841    TEST NOT REPORTED DUE TO COLOR INTERFERENCE OF URINE PIGMENT   PROTEINUR (A) 01/03/2022 1841    TEST NOT REPORTED DUE TO COLOR INTERFERENCE OF URINE PIGMENT   UROBILINOGEN Color Interference (A) 08/13/2021 1003   NITRITE (A) 01/03/2022 1841    TEST NOT REPORTED DUE TO COLOR INTERFERENCE OF URINE PIGMENT   LEUKOCYTESUR (A) 01/03/2022 1841    TEST NOT REPORTED DUE TO COLOR INTERFERENCE OF URINE PIGMENT   Sepsis Labs: '@LABRCNTIP'$ (procalcitonin:4,lacticidven:4)  )No results found for this or any previous visit (from the past 240 hour(s)).       Radiology Studies: CT Renal Stone Study  Result Date: 01/03/2022 CLINICAL DATA:  Urinary frequency. EXAM: CT ABDOMEN AND PELVIS WITHOUT CONTRAST TECHNIQUE: Multidetector CT imaging of the abdomen and pelvis was performed following the standard protocol without IV contrast. RADIATION DOSE REDUCTION: This exam was performed according to the departmental dose-optimization program which includes automated exposure control, adjustment of the mA and/or kV according to patient size and/or use of iterative reconstruction technique. COMPARISON:  December 03, 2021 FINDINGS: Lower chest: Mild linear atelectasis is seen within the left lung base. Hepatobiliary: 15 mm, 7 mm and 8 mm simple  cysts are seen within the right lobe of the liver. No gallstones, gallbladder wall thickening, or biliary dilatation. Pancreas: Unremarkable. No pancreatic ductal dilatation or surrounding inflammatory changes. Spleen: Normal in size without focal abnormality. Adrenals/Urinary Tract: Adrenal glands are unremarkable. Kidneys are normal in size, without renal calculi or hydronephrosis. A 4.4 cm diameter cyst is seen within the anterolateral aspect of the mid left kidney. A Foley catheter is seen within the urinary bladder. Its distal tip extends into the anterior urinary bladder wall (axial CT image 65, CT series 2). A 5.4 cm x 3.7 cm x 3.5 cm well-defined area of increased attenuation (approximately 50.17 Hounsfield units) is seen within the dependent portion of the bladder lumen. This is mildly increased in size when compared to the prior study. A small amount of air is seen along the anterior bladder wall. Stomach/Bowel: Stomach is within normal limits. Appendix appears normal. No evidence of bowel wall thickening, distention, or inflammatory changes. Noninflamed diverticula are seen within the descending and sigmoid colon. Vascular/Lymphatic: Aortic atherosclerosis. No enlarged abdominal or  pelvic lymph nodes. Reproductive: The prostate gland is surgically absent. Other: No abdominal wall hernia or abnormality. No abdominopelvic ascites. Musculoskeletal: A total right hip replacement is seen with associated streak artifact and subsequently limited evaluation of the adjacent osseous and soft tissue structures. Multilevel degenerative changes seen throughout the lumbar spine. IMPRESSION: 1. Findings likely consistent with a large blood clot within the urinary bladder lumen, mildly increased in size when compared to the prior study. Further evaluation with urology consultation and cystography is recommended, as an underlying neoplastic process cannot be excluded. 2. Colonic diverticulosis. 3. Status post  prostatectomy. 4. Total right hip replacement. 5. Simple cyst within the left kidney (Bosniak 1). No additional follow-up or imaging is recommended. This recommendation follows ACR consensus guidelines: Management of the Incidental Renal Mass on CT: A White Paper of the ACR Incidental Findings Committee. J Am Coll Radiol [phone]. 6. Foley catheter positioning, as described above with a small amount of air along the anterior urinary bladder lumen. A small amount of air within the bladder wall cannot be excluded. 7. Aortic atherosclerosis. Aortic Atherosclerosis (ICD10-I70.0). Electronically Signed   By: Virgina Norfolk M.D.   On: 01/03/2022 21:48        Scheduled Meds:  atorvastatin  10 mg Oral Daily   cyanocobalamin  500 mcg Oral QPM   fesoterodine  4 mg Oral Daily   finasteride  5 mg Oral q morning   lisinopril  40 mg Oral QHS   metoprolol succinate  25 mg Oral BID   tamsulosin  0.4 mg Oral QPC supper   Continuous Infusions:  sodium chloride irrigation       LOS: 0 days    Dana Allan, MD  [facility] Pager #: [phone] 7PM-7AM contact night coverage as above

## 2022-01-05 DIAGNOSIS — R319 Hematuria, unspecified: Secondary | ICD-10-CM | POA: Diagnosis not present

## 2022-01-05 DIAGNOSIS — I10 Essential (primary) hypertension: Secondary | ICD-10-CM | POA: Diagnosis not present

## 2022-01-05 DIAGNOSIS — R310 Gross hematuria: Secondary | ICD-10-CM | POA: Diagnosis not present

## 2022-01-05 DIAGNOSIS — G4733 Obstructive sleep apnea (adult) (pediatric): Secondary | ICD-10-CM

## 2022-01-05 DIAGNOSIS — C61 Malignant neoplasm of prostate: Secondary | ICD-10-CM

## 2022-01-05 DIAGNOSIS — I483 Typical atrial flutter: Secondary | ICD-10-CM | POA: Diagnosis not present

## 2022-01-05 LAB — CBC WITH DIFFERENTIAL/PLATELET
Abs Immature Granulocytes: 0.02 10*3/uL (ref 0.00–0.07)
Basophils Absolute: 0 10*3/uL (ref 0.0–0.1)
Basophils Relative: 0 %
Eosinophils Absolute: 0.2 10*3/uL (ref 0.0–0.5)
Eosinophils Relative: 3 %
HCT: 28.7 % — ABNORMAL LOW (ref 39.0–52.0)
Hemoglobin: 9.2 g/dL — ABNORMAL LOW (ref 13.0–17.0)
Immature Granulocytes: 0 %
Lymphocytes Relative: 16 %
Lymphs Abs: 1 10*3/uL (ref 0.7–4.0)
MCH: 34.5 pg — ABNORMAL HIGH (ref 26.0–34.0)
MCHC: 32.1 g/dL (ref 30.0–36.0)
MCV: 107.5 fL — ABNORMAL HIGH (ref 80.0–100.0)
Monocytes Absolute: 0.9 10*3/uL (ref 0.1–1.0)
Monocytes Relative: 14 %
Neutro Abs: 4.2 10*3/uL (ref 1.7–7.7)
Neutrophils Relative %: 67 %
Platelets: 145 10*3/uL — ABNORMAL LOW (ref 150–400)
RBC: 2.67 MIL/uL — ABNORMAL LOW (ref 4.22–5.81)
RDW: 14.3 % (ref 11.5–15.5)
WBC: 6.4 10*3/uL (ref 4.0–10.5)
nRBC: 0 % (ref 0.0–0.2)

## 2022-01-05 LAB — COMPREHENSIVE METABOLIC PANEL
ALT: 13 U/L (ref 0–44)
AST: 16 U/L (ref 15–41)
Albumin: 2.9 g/dL — ABNORMAL LOW (ref 3.5–5.0)
Alkaline Phosphatase: 43 U/L (ref 38–126)
Anion gap: 5 (ref 5–15)
BUN: 12 mg/dL (ref 8–23)
CO2: 29 mmol/L (ref 22–32)
Calcium: 7.9 mg/dL — ABNORMAL LOW (ref 8.9–10.3)
Chloride: 108 mmol/L (ref 98–111)
Creatinine, Ser: 0.71 mg/dL (ref 0.61–1.24)
GFR, Estimated: >60 mL/min (ref >60)
Glucose, Bld: 113 mg/dL — ABNORMAL HIGH (ref 70–99)
Potassium: 3.6 mmol/L (ref 3.5–5.1)
Sodium: 142 mmol/L (ref 135–145)
Total Bilirubin: 0.7 mg/dL (ref 0.3–1.2)
Total Protein: 5.2 g/dL — ABNORMAL LOW (ref 6.5–8.1)

## 2022-01-05 LAB — URINE CULTURE

## 2022-01-05 NOTE — Discharge Summary (Signed)
Physician Discharge Summary  Connor [name] UVO:[id number] DOB: 12/30/1933 DOA: 01/03/2022  PCP: Connor Mclean, MD  Admit date: 01/03/2022 Discharge date: 01/05/2022  Admitted From: Home Disposition: Home  Recommendations for Outpatient Follow-up:  Follow up with PCP in 1-2 weeks Follow-up with urology, Dr. Diona Fanti as scheduled on 01/09/2022 Discontinued Xarelto, has outpatient follow-up with cardiology on 01/09/2022, may need to consider complete discontinuation given recurrence of gross hematuria  Home Health: No Equipment/Devices: Foley catheter  Discharge Condition: Able CODE STATUS: Full code Diet recommendation: Heart healthy diet  History of present illness:  Connor [name] is a 88 year old male with past medical history significant for atrial fibrillation s/p ablation on Xarelto, history of prostate cancer, HTN, chronic diastolic congestive heart failure who presented to [facility] ED on 8/31 with hematuria after straining during a bowel movement.  This is now a recurrent event with recent episode in which Foley catheter was placed and underwent irrigation last month and was eventually removed following a voiding trial.  Likely complicated by continued Xarelto use.  In the ED, CT abdomen/pelvis notable for clots in the urinary bladder.  A Foley catheter was placed but patient started to have persistent hematuria.  Urology was consulted and patient was started on continuous bladder irrigation.  TRH consulted for admission for gross hematuria.  Hospital course:  Gross hematuria Patient presenting to ED with recurrent hematuria following bowel movement at home.  Similar to recent events in which required a Foley catheter, bladder irrigation which was eventually removed last month.  CT abdomen/pelvis notable for a blood clot in the urinary bladder.  Following Foley catheter insertion in the ED, patient developed gross hematuria and urology was consulted.  Patient underwent  complete bladder irrigation with improvement of hematuria.  Reevaluated by urology, Dr. Abner Greenspan on 9/2 given urine and now clear with occasional clots okay for discharge home.  Patient has outpatient follow-up with Dr. Diona Fanti on 01/09/2022.  We will continue Foley catheter until outpatient urology visit.  Will discontinue Xarelto for now, patient has appointment with cardiology scheduled on 01/09/2022, given recurrence of events may need to completely discontinue anticoagulation.  Chronic diastolic congestive heart failure, compensated Essential hypertension Continue metoprolol succinate 25 mg p.o. every morning, 50 mg p.o. every afternoon, lisinopril 40 mg p.o. daily, atorvastatin 10 mg p.o. daily.  Persistent atrial fibrillation Patient with previous ablation, currently on Xarelto for anticoagulation.  Continue metoprolol succinate.  We will discontinue Xarelto as above now recurrent gross hematuria.  Has outpatient follow-up with cardiology on 01/09/2022.  May need to fully discontinue anticoagulation now recurrent hematuria events.  BPH: Continue tamsulosin 0.4 mg p.o. daily  HLD: Atorvastatin 10 mg p.o. daily  Discharge Diagnoses:  Principal Problem:   Hematuria Active Problems:   h/o Atrial flutter (HCC) s/p ablation 2012   Hypertension   OSA (obstructive sleep apnea)   Malignant neoplasm of prostate ([facility])   Gross hematuria    Discharge Instructions  Discharge Instructions     Call MD for:  difficulty breathing, headache or visual disturbances   Complete by: As directed    Call MD for:  extreme fatigue   Complete by: As directed    Call MD for:  persistant dizziness or light-headedness   Complete by: As directed    Call MD for:  persistant nausea and vomiting   Complete by: As directed    Call MD for:  severe uncontrolled pain   Complete by: As directed    Call MD for:  temperature >100.4   Complete by: As directed    Continue foley catheter   Complete by: As directed     Diet - low sodium heart healthy   Complete by: As directed    Increase activity slowly   Complete by: As directed       Allergies as of 01/05/2022       Reactions   Penicillins Itching, Swelling, Other (See Comments)   Did it involve swelling of the face/tongue/throat, SOB, or low BP? No Did it involve sudden or severe rash/hives, skin peeling, or any reaction on the inside of your mouth or nose? No Did you need to seek medical attention at a hospital or doctor's office? No When did it last happen?      30 + years If all above answers are "NO", may proceed with cephalosporin use.        Medication List     STOP taking these medications    Xarelto 20 MG Tabs tablet Generic drug: rivaroxaban       TAKE these medications    acetaminophen 500 MG tablet Commonly known as: TYLENOL Take 1,000 mg by mouth 2 (two) times daily as needed for moderate pain.   atorvastatin 10 MG tablet Commonly known as: LIPITOR TAKE 1 TABLET BY MOUTH EVERY DAY   calcium carbonate 500 MG chewable tablet Commonly known as: TUMS - dosed in mg elemental calcium Chew 1 tablet by mouth daily as needed for heartburn.   Cranberry 1000 MG Caps Take 3,000 mg by mouth at bedtime.   finasteride 5 MG tablet Commonly known as: PROSCAR Take 5 mg by mouth every morning.   Humira Pen 40 MG/0.4ML Pnkt Generic drug: Adalimumab Inject 40 mg as directed every 7 (seven) days. Every Tuesday   lisinopril 40 MG tablet Commonly known as: ZESTRIL TAKE 1 TABLET BY MOUTH EVERY DAY What changed: when to take this   metoprolol succinate 50 MG 24 hr tablet Commonly known as: TOPROL-XL TAKE 1/2 TABLET IN THE MORNING AND TAKE 1 TABLET IN THE EVENING What changed: See the new instructions.   omeprazole 20 MG capsule Commonly known as: PRILOSEC Take 20 mg by mouth daily.   PreserVision AREDS 2 Caps Take 1 capsule by mouth 2 (two) times a day.   solifenacin 10 MG tablet Commonly known as: VESICARE Take 10 mg  by mouth at bedtime.   STOOL SOFTENER PO Take 4 tablets by mouth at bedtime.   tamsulosin 0.4 MG Caps capsule Commonly known as: Flomax Take 1 capsule (0.4 mg total) by mouth daily after supper.   Testosterone 20.25 MG/ACT (1.62%) Gel Apply 1 Pump topically at bedtime. Apply to shoulders   THERATEARS OP Apply 1 drop to eye daily as needed (dry eyes).   vitamin B-12 500 MCG tablet Commonly known as: CYANOCOBALAMIN Take 500 mcg by mouth every evening.   Vitamin D3 125 MCG (5000 UT) Tabs Take 5,000 Units by mouth daily.        Follow-up Information     Copland, Gay Filler, MD. Schedule an appointment as soon as possible for a visit in 1 week(s).   Specialty: Family Medicine Contact information: [address] [place] [address] [phone]         Franchot Gallo, MD. Daphane Shepherd on 01/09/2022.   Specialty: Urology Contact information: [address] [place] [address] [phone]                Allergies  Allergen  Reactions   Penicillins Itching, Swelling and Other (See Comments)    Did it involve swelling of the face/tongue/throat, SOB, or low BP? No Did it involve sudden or severe rash/hives, skin peeling, or any reaction on the inside of your mouth or nose? No Did you need to seek medical attention at a hospital or doctor's office? No When did it last happen?      30 + years If all above answers are "NO", may proceed with cephalosporin use.      Consultations: Urology, Dr. Junious Silk, Dr. Stormy Fabian, Dr. Diona Fanti   Procedures/Studies: CT Renal Stone Study  Result Date: 01/03/2022 CLINICAL DATA:  Urinary frequency. EXAM: CT ABDOMEN AND PELVIS WITHOUT CONTRAST TECHNIQUE: Multidetector CT imaging of the abdomen and pelvis was performed following the standard protocol without IV contrast. RADIATION DOSE REDUCTION: This exam was performed according to the departmental dose-optimization program which includes automated exposure control,  adjustment of the mA and/or kV according to patient size and/or use of iterative reconstruction technique. COMPARISON:  December 03, 2021 FINDINGS: Lower chest: Mild linear atelectasis is seen within the left lung base. Hepatobiliary: 15 mm, 7 mm and 8 mm simple cysts are seen within the right lobe of the liver. No gallstones, gallbladder wall thickening, or biliary dilatation. Pancreas: Unremarkable. No pancreatic ductal dilatation or surrounding inflammatory changes. Spleen: Normal in size without focal abnormality. Adrenals/Urinary Tract: Adrenal glands are unremarkable. Kidneys are normal in size, without renal calculi or hydronephrosis. A 4.4 cm diameter cyst is seen within the anterolateral aspect of the mid left kidney. A Foley catheter is seen within the urinary bladder. Its distal tip extends into the anterior urinary bladder wall (axial CT image 65, CT series 2). A 5.4 cm x 3.7 cm x 3.5 cm well-defined area of increased attenuation (approximately 50.17 Hounsfield units) is seen within the dependent portion of the bladder lumen. This is mildly increased in size when compared to the prior study. A small amount of air is seen along the anterior bladder wall. Stomach/Bowel: Stomach is within normal limits. Appendix appears normal. No evidence of bowel wall thickening, distention, or inflammatory changes. Noninflamed diverticula are seen within the descending and sigmoid colon. Vascular/Lymphatic: Aortic atherosclerosis. No enlarged abdominal or pelvic lymph nodes. Reproductive: The prostate gland is surgically absent. Other: No abdominal wall hernia or abnormality. No abdominopelvic ascites. Musculoskeletal: A total right hip replacement is seen with associated streak artifact and subsequently limited evaluation of the adjacent osseous and soft tissue structures. Multilevel degenerative changes seen throughout the lumbar spine. IMPRESSION: 1. Findings likely consistent with a large blood clot within the urinary  bladder lumen, mildly increased in size when compared to the prior study. Further evaluation with urology consultation and cystography is recommended, as an underlying neoplastic process cannot be excluded. 2. Colonic diverticulosis. 3. Status post prostatectomy. 4. Total right hip replacement. 5. Simple cyst within the left kidney (Bosniak 1). No additional follow-up or imaging is recommended. This recommendation follows ACR consensus guidelines: Management of the Incidental Renal Mass on CT: A White Paper of the ACR Incidental Findings Committee. J Am Coll Radiol [phone]. 6. Foley catheter positioning, as described above with a small amount of air along the anterior urinary bladder lumen. A small amount of air within the bladder wall cannot be excluded. 7. Aortic atherosclerosis. Aortic Atherosclerosis (ICD10-I70.0). Electronically Signed   By: Virgina Norfolk M.D.   On: 01/03/2022 21:48     Subjective: Patient seen examined bedside, resting comfortably.  No specific complaints this morning.  Seen by urology, Dr. Abner Greenspan and okay for discharge home with outpatient follow-up scheduled next week.  Foley remain in place.  No other questions or concerns at this time.  Denies fever/chills/night sweats, no nausea/vomiting/diarrhea, headache, no dizziness, no chest pain, no palpitation, no shortness of breath, no abdominal pain.  No acute events overnight per nursing staff.  Discharge Exam: Vitals:   01/04/22 2343 01/05/22 0459  BP: 106/64 (!) 98/57  Pulse: 85 95  Resp: 19 18  Temp: 98.5 F (36.9 C) 98.7 F (37.1 C)  SpO2: 96% 97%   Vitals:   01/04/22 2048 01/04/22 2302 01/04/22 2343 01/05/22 0459  BP: 108/71  106/64 (!) 98/57  Pulse:   85 95  Resp:   19 18  Temp:   98.5 F (36.9 C) 98.7 F (37.1 C)  TempSrc:   Oral Oral  SpO2:   96% 97%  Weight:  100.7 kg    Height:  '6\' 1"'$  (1.854 m)      Physical Exam: GEN: NAD, alert and oriented x 3, wd/wn HEENT: NCAT, PERRL, EOMI, sclera clear,  MMM PULM: CTAB w/o wheezes/crackles, normal respiratory effort, on room air CV: RRR w/o M/G/R GI: abd soft, NTND, NABS, no R/G/M GU: Foley catheter noted draining yellow urine with occasional small blood clots noted, free-flowing MSK: no peripheral edema, muscle strength globally intact 5/5 bilateral upper/lower extremities NEURO: CN II-XII intact, no focal deficits, sensation to light touch intact PSYCH: normal mood/affect Integumentary: dry/intact, no rashes or wounds    The results of significant diagnostics from this hospitalization (including imaging, microbiology, ancillary and laboratory) are listed below for reference.     Microbiology: No results found for this or any previous visit (from the past 240 hour(s)).   Labs: BNP (last 3 results) No results for input(s): "BNP" in the last 8760 hours. Basic Metabolic Panel: Recent Labs  Lab 01/03/22 1853 01/05/22 0353  NA 141 142  K 3.7 3.6  CL 109 108  CO2 25 29  GLUCOSE 119* 113*  BUN 17 12  CREATININE 0.87 0.71  CALCIUM 9.0 7.9*   Liver Function Tests: Recent Labs  Lab 01/03/22 1853 01/05/22 0353  AST 20 16  ALT 18 13  ALKPHOS 64 43  BILITOT 0.9 0.7  PROT 7.2 5.2*  ALBUMIN 4.1 2.9*   No results for input(s): "LIPASE", "AMYLASE" in the last 168 hours. No results for input(s): "AMMONIA" in the last 168 hours. CBC: Recent Labs  Lab 01/03/22 1853 01/04/22 0540 01/04/22 1208 01/05/22 0725  WBC 5.2 5.7 6.6 6.4  NEUTROABS 3.6  --   --  4.2  HGB 12.7* 10.0* 10.2* 9.2*  HCT 39.1 30.0* 31.2* 28.7*  MCV 106.8* 106.0* 106.1* 107.5*  PLT 177 134* 136* 145*   Cardiac Enzymes: No results for input(s): "CKTOTAL", "CKMB", "CKMBINDEX", "TROPONINI" in the last 168 hours. BNP: Invalid input(s): "POCBNP" CBG: No results for input(s): "GLUCAP" in the last 168 hours. D-Dimer No results for input(s): "DDIMER" in the last 72 hours. Hgb A1c No results for input(s): "HGBA1C" in the last 72 hours. Lipid Profile No  results for input(s): "CHOL", "HDL", "LDLCALC", "TRIG", "CHOLHDL", "LDLDIRECT" in the last 72 hours. Thyroid function studies No results for input(s): "TSH", "T4TOTAL", "T3FREE", "THYROIDAB" in the last 72 hours.  Invalid input(s): "FREET3" Anemia work up No results for input(s): "VITAMINB12", "FOLATE", "FERRITIN", "TIBC", "IRON", "RETICCTPCT" in the last 72 hours. Urinalysis    Component Value Date/Time   COLORURINE RED (A) 01/03/2022 1841   APPEARANCEUR (A)  01/03/2022 1841    TEST NOT REPORTED DUE TO COLOR INTERFERENCE OF URINE PIGMENT   LABSPEC 1.021 01/03/2022 1841   PHURINE  01/03/2022 1841    TEST NOT REPORTED DUE TO COLOR INTERFERENCE OF URINE PIGMENT   GLUCOSEU (A) 01/03/2022 1841    TEST NOT REPORTED DUE TO COLOR INTERFERENCE OF URINE PIGMENT   GLUCOSEU Color Interference (A) 08/13/2021 1003   HGBUR (A) 01/03/2022 1841    TEST NOT REPORTED DUE TO COLOR INTERFERENCE OF URINE PIGMENT   BILIRUBINUR (A) 01/03/2022 1841    TEST NOT REPORTED DUE TO COLOR INTERFERENCE OF URINE PIGMENT   BILIRUBINUR negative 08/16/2019 0922   BILIRUBINUR Neg 09/20/2014 1127   KETONESUR (A) 01/03/2022 1841    TEST NOT REPORTED DUE TO COLOR INTERFERENCE OF URINE PIGMENT   PROTEINUR (A) 01/03/2022 1841    TEST NOT REPORTED DUE TO COLOR INTERFERENCE OF URINE PIGMENT   UROBILINOGEN Color Interference (A) 08/13/2021 1003   NITRITE (A) 01/03/2022 1841    TEST NOT REPORTED DUE TO COLOR INTERFERENCE OF URINE PIGMENT   LEUKOCYTESUR (A) 01/03/2022 1841    TEST NOT REPORTED DUE TO COLOR INTERFERENCE OF URINE PIGMENT   Sepsis Labs Recent Labs  Lab 01/03/22 1853 01/04/22 0540 01/04/22 1208 01/05/22 0725  WBC 5.2 5.7 6.6 6.4   Microbiology No results found for this or any previous visit (from the past 240 hour(s)).   Time coordinating discharge: Over 30 minutes  SIGNED:   Donnamarie Poag [place] DO  [facility] 01/05/2022, 10:12 AM

## 2022-01-05 NOTE — Progress Notes (Signed)
Pt discharged to home at this time. Prior to dc, IV and tele was removed. Pt was given dc paperwork regarding medications, catheter care, and appointments. Pt and daughter verbalized understanding and stated no concerns at this time. Pt stable at time of dc and left in personal vehicle driven by daughter.

## 2022-01-05 NOTE — Progress Notes (Signed)
.   Transition of Care [facility]) Screening Note   Patient Details  Name: Connor [name] Date of Birth: 1933/07/06   Transition of [facility]) CM/SW Contact:    Illene Regulus, LCSW Phone Number: 01/05/2022, 10:28 AM    Transition of Care Department [facility]) has reviewed patient and no TOC needs have been identified at this time. We will continue to monitor patient advancement through interdisciplinary progression rounds. If new patient transition needs arise, please place a TOC consult.

## 2022-01-08 ENCOUNTER — Telehealth: Payer: Self-pay | Admitting: *Deleted

## 2022-01-08 ENCOUNTER — Encounter: Payer: Self-pay | Admitting: *Deleted

## 2022-01-08 NOTE — Patient Outreach (Signed)
  Care Coordination [facility] Note Transition Care Management Follow-up Telephone Call Date of discharge and from where: 01/05/22 [facility] How have you been since you were released from the hospital? "I am managing okay; things are going well so far; I am glad that I will be seeing the urologist and the cardiologist tomorrow; I am hoping they will take this catheter out" Any questions or concerns? No  Items Reviewed: Did the pt receive and understand the discharge instructions provided? Yes  Medications obtained and verified? Yes - verifies has stopped taking Xarelto Other?  N/A Any new allergies since your discharge? No  Dietary orders reviewed? Yes Do you have support at home? Yes  wife assisting as needed/ indicated  Home Care and Equipment/Supplies: Were home health services ordered? no If so, what is the name of the agency? N/A  Has the agency set up a time to come to the patient's home? not applicable Were any new equipment or medical supplies ordered?  No What is the name of the medical supply agency? N/A Were you able to get the supplies/equipment? not applicable Do you have any questions related to the use of the equipment or supplies? No N/A  Functional Questionnaire: (I = Independent and D = Dependent) ADLs: I  wife assisting as needed/ indicated  Bathing/Dressing- I  Meal Prep- I  wife assisting as needed/ indicated  Eating- I  Maintaining continence- I  wife assisting with foley catheter care/ maintenance as needed/ indicated  Transferring/Ambulation- I  Managing Meds- I  Follow up appointments reviewed:  PCP Hospital f/u appt confirmed? No  Scheduled to see - on - @ [facility] f/u appt confirmed? Yes  Scheduled to see cardiology provider and urology provider on Wednesday 01/09/22 @ 10:00 am and 12:45 pm respectively Are transportation arrangements needed? No  If their condition worsens, is the pt aware to call PCP or go to the Emergency Dept.?  Yes Was the patient provided with contact information for the PCP's office or ED? Yes Was to pt encouraged to call back with questions or concerns? Yes  SDOH assessments and interventions completed:   Yes  Care Coordination Interventions Activated:  Yes   Care Coordination Interventions:   reviewed basics of foley catheter care     Encounter Outcome:  Pt. Visit Completed    Oneta Rack, RN, BSN, CCRN Alumnus RN CM Care Coordination/ Transition of [place] Management [phone]: direct office

## 2022-01-09 ENCOUNTER — Ambulatory Visit: Payer: Medicare PPO | Attending: Cardiology | Admitting: Cardiology

## 2022-01-09 ENCOUNTER — Encounter: Payer: Self-pay | Admitting: Cardiology

## 2022-01-09 VITALS — BP 130/66 | HR 87 | Ht 72.0 in | Wt 224.6 lb

## 2022-01-09 DIAGNOSIS — I428 Other cardiomyopathies: Secondary | ICD-10-CM | POA: Diagnosis not present

## 2022-01-09 DIAGNOSIS — I480 Paroxysmal atrial fibrillation: Secondary | ICD-10-CM | POA: Diagnosis not present

## 2022-01-09 DIAGNOSIS — G4733 Obstructive sleep apnea (adult) (pediatric): Secondary | ICD-10-CM

## 2022-01-09 DIAGNOSIS — I10 Essential (primary) hypertension: Secondary | ICD-10-CM | POA: Diagnosis not present

## 2022-01-09 DIAGNOSIS — R310 Gross hematuria: Secondary | ICD-10-CM | POA: Diagnosis not present

## 2022-01-09 NOTE — Patient Instructions (Signed)
  Follow-Up: At [facility], you and your health needs are our priority.  As part of our continuing mission to provide you with exceptional heart care, we have created designated Provider Care Teams.  These Care Teams include your primary Cardiologist (physician) and Advanced Practice Providers (APPs -  Physician Assistants and Nurse Practitioners) who all work together to provide you with the care you need, when you need it.  We recommend signing up for the patient portal called "MyChart".  Sign up information is provided on this After Visit Summary.  MyChart is used to connect with patients for Virtual Visits (Telemedicine).  Patients are able to view lab/test results, encounter notes, upcoming appointments, etc.  Non-urgent messages can be sent to your provider as well.   To learn more about what you can do with MyChart, go to [email or web address].    Your next appointment:   6 month(s)  The format for your next appointment:   In Person  Provider:   Brian Crenshaw, MD    

## 2022-01-10 DIAGNOSIS — R338 Other retention of urine: Secondary | ICD-10-CM | POA: Diagnosis not present

## 2022-01-11 ENCOUNTER — Other Ambulatory Visit: Payer: Self-pay | Admitting: Urology

## 2022-01-11 DIAGNOSIS — R310 Gross hematuria: Secondary | ICD-10-CM | POA: Diagnosis not present

## 2022-01-11 DIAGNOSIS — Z8546 Personal history of malignant neoplasm of prostate: Secondary | ICD-10-CM | POA: Diagnosis not present

## 2022-01-14 ENCOUNTER — Encounter: Payer: Self-pay | Admitting: Cardiology

## 2022-01-17 NOTE — Telephone Encounter (Signed)
LMTCB

## 2022-01-21 NOTE — Progress Notes (Signed)
COVID Vaccine Completed: yes x5  Date of COVID positive in last 90 days:  PCP - Lamar Blinks, MD Cardiologist - Kirk Ruths, MD  Chest x-ray - 09/12/21 Epic EKG - 01/09/22 Epic Stress Test - 2013 ECHO - 02/25/20 Epic Cardiac Cath - 2013 Pacemaker/ICD device last checked: Spinal Cord Stimulator:  Bowel Prep -   Sleep Study -  CPAP -   Fasting Blood Sugar -  Checks Blood Sugar _____ times a day  Blood Thinner Instructions: Xarelto discontinued?? Aspirin Instructions: Last Dose:  Activity level:  Can go up a flight of stairs and perform activities of daily living without stopping and without symptoms of chest pain or shortness of breath.  Able to exercise without symptoms  Unable to go up a flight of stairs without symptoms of     Anesthesia review: a fib, HTN, PVC, OSA, RBBB, DVT, NICM  Patient denies shortness of breath, fever, cough and chest pain at PAT appointment  Patient verbalized understanding of instructions that were given to them at the PAT appointment. Patient was also instructed that they will need to review over the PAT instructions again at home before surgery.

## 2022-01-21 NOTE — Patient Instructions (Signed)
SURGICAL WAITING ROOM VISITATION Patients having surgery or a procedure may have no more than 2 support people in the waiting area - these visitors may rotate.   Children under the age of 21 must have an adult with them who is not the patient. If the patient needs to stay at the hospital during part of their recovery, the visitor guidelines for inpatient rooms apply. Pre-op nurse will coordinate an appropriate time for 1 support person to accompany patient in pre-op.  This support person may not rotate.    Please refer to the [facility] website for the visitor guidelines for Inpatients (after your surgery is over and you are in a regular room).    Your procedure is scheduled on: 01/23/22   Report to [facility] Main Entrance    Report to admitting at 12:45 AM   Call this number if you have problems the morning of surgery [phone]   Do not eat food :After Midnight.   After Midnight you may have the following liquids until 12:00 PM DAY OF SURGERY  Water Non-Citrus Juices (without pulp, NO RED) Carbonated Beverages Black Coffee (NO MILK/CREAM OR CREAMERS, sugar ok)  Clear Tea (NO MILK/CREAM OR CREAMERS, sugar ok) regular and decaf                             Plain Jell-O (NO RED)                                           Fruit ices (not with fruit pulp, NO RED)                                     Popsicles (NO RED)                                                               Sports drinks like Gatorade (NO RED)              FOLLOW BOWEL PREP AND ANY ADDITIONAL PRE OP INSTRUCTIONS YOU RECEIVED FROM YOUR SURGEON'S OFFICE!!!     Oral Hygiene is also important to reduce your risk of infection.                                    Remember - BRUSH YOUR TEETH THE MORNING OF SURGERY WITH YOUR REGULAR TOOTHPASTE   Take these medicines the morning of surgery with A SIP OF WATER: Tylenol, Atorvastatin, Finasteride, Metoprolol, Omeprazole                               You may not  have any metal on your body including jewelry, and body piercing             Do not wear lotions, powders, cologne, or deodorant              Men may shave face and neck.   Do not bring  valuables to the hospital. [place].  DO NOT [place]. PHARMACY WILL DISPENSE MEDICATIONS LISTED ON YOUR MEDICATION LIST TO YOU DURING YOUR ADMISSION [place]!    Patients discharged on the day of surgery will not be allowed to drive home.  Someone NEEDS to stay with you for the first 24 hours after anesthesia.   Special Instructions: Bring a copy of your healthcare power of attorney and living will documents the day of surgery if you haven't scanned them before.              Please read over the following fact sheets you were given: IF [place] [phone] Schneiders   If you received a COVID test during your pre-op visit  it is requested that you wear a mask when out in public, stay away from anyone that may not be feeling well and notify your surgeon if you develop symptoms. If you test positive for Covid or have been in contact with anyone that has tested positive in the last 10 days please notify you surgeon.     [place] - Preparing for Surgery Before surgery, you can play an important role.  Because skin is not sterile, your skin needs to be as free of germs as possible.  You can reduce the number of germs on your skin by washing with CHG (chlorahexidine gluconate) soap before surgery.  CHG is an antiseptic cleaner which kills germs and bonds with the skin to continue killing germs even after washing. Please DO NOT use if you have an allergy to CHG or antibacterial soaps.  If your skin becomes reddened/irritated stop using the CHG and inform your nurse when you arrive at Short Stay. Do not shave (including legs and underarms) for at least 48 hours prior to the  first CHG shower.  You may shave your face/neck.  Please follow these instructions carefully:  1.  Shower with CHG Soap the night before surgery and the  morning of surgery.  2.  If you choose to wash your hair, wash your hair first as usual with your normal  shampoo.  3.  After you shampoo, rinse your hair and body thoroughly to remove the shampoo.                             4.  Use CHG as you would any other liquid soap.  You can apply chg directly to the skin and wash.  Gently with a scrungie or clean washcloth.  5.  Apply the CHG Soap to your body ONLY FROM THE NECK DOWN.   Do   not use on face/ open                           Wound or open sores. Avoid contact with eyes, ears mouth and   genitals (private parts).                       Wash face,  Genitals (private parts) with your normal soap.             6.  Wash thoroughly, paying special attention to the area where your    surgery  will be performed.  7.  Thoroughly rinse your body with warm water from the neck down.  8.  DO NOT shower/wash with your normal soap after using and rinsing off the CHG Soap.                9.  Pat yourself dry with a clean towel.            10.  Wear clean pajamas.            11.  Place clean sheets on your bed the night of your first shower and do not  sleep with pets. Day of Surgery : Do not apply any lotions/deodorants the morning of surgery.  Please wear clean clothes to the hospital/surgery center.  FAILURE TO FOLLOW THESE INSTRUCTIONS MAY RESULT IN THE CANCELLATION OF YOUR SURGERY  PATIENT SIGNATURE_________________________________  NURSE SIGNATURE__________________________________  ________________________________________________________________________

## 2022-01-22 ENCOUNTER — Encounter (HOSPITAL_COMMUNITY): Payer: Self-pay

## 2022-01-22 ENCOUNTER — Encounter (HOSPITAL_COMMUNITY)
Admission: RE | Admit: 2022-01-22 | Discharge: 2022-01-22 | Disposition: A | Discharge status: Home / Self Care | Payer: Medicare PPO | Source: Ambulatory Visit | Attending: Urology | Admitting: Urology

## 2022-01-22 VITALS — BP 134/70 | HR 66 | Temp 98.3°F | Resp 16 | Ht 72.0 in | Wt 220.0 lb

## 2022-01-22 DIAGNOSIS — I4891 Unspecified atrial fibrillation: Secondary | ICD-10-CM | POA: Insufficient documentation

## 2022-01-22 DIAGNOSIS — Z01818 Encounter for other preprocedural examination: Secondary | ICD-10-CM | POA: Insufficient documentation

## 2022-01-22 DIAGNOSIS — H353132 Nonexudative age-related macular degeneration, bilateral, intermediate dry stage: Secondary | ICD-10-CM | POA: Diagnosis not present

## 2022-01-22 HISTORY — DX: Malignant (primary) neoplasm, unspecified: C80.1

## 2022-01-22 HISTORY — DX: Unspecified macular degeneration: H35.30

## 2022-01-22 HISTORY — DX: Pure hypercholesterolemia, unspecified: E78.00

## 2022-01-22 LAB — CBC
HCT: 33.9 % — ABNORMAL LOW (ref 39.0–52.0)
Hemoglobin: 10.5 g/dL — ABNORMAL LOW (ref 13.0–17.0)
MCH: 32.8 pg (ref 26.0–34.0)
MCHC: 31 g/dL (ref 30.0–36.0)
MCV: 105.9 fL — ABNORMAL HIGH (ref 80.0–100.0)
Platelets: 200 10*3/uL (ref 150–400)
RBC: 3.2 MIL/uL — ABNORMAL LOW (ref 4.22–5.81)
RDW: 13.6 % (ref 11.5–15.5)
WBC: 6 10*3/uL (ref 4.0–10.5)
nRBC: 0 % (ref 0.0–0.2)

## 2022-01-22 NOTE — Anesthesia Preprocedure Evaluation (Signed)
Anesthesia Evaluation  Patient identified by MRN, date of birth, ID band Patient awake    Reviewed: Allergy & Precautions, NPO status , Patient's Chart, lab work & pertinent test results, reviewed documented beta blocker date and time   Airway Mallampati: II  TM Distance: >3 FB Neck ROM: Full    Dental  (+) Dental Advisory Given   Pulmonary sleep apnea , former smoker,    Pulmonary exam normal breath sounds clear to auscultation       Cardiovascular hypertension, Pt. on medications and Pt. on home beta blockers pulmonary hypertension+ dysrhythmias  Rhythm:Irregular Rate:Normal  Echo 02/2020 1. Left ventricular ejection fraction, by estimation, is 50 to 55%. The left ventricle has low normal function. The left ventricle has no regional wall motion abnormalities. The left ventricular internal cavity size was mildly dilated. Left ventricular diastolic parameters are consistent with Grade I diastolic dysfunction (impaired relaxation).  2. Right ventricular systolic function is normal. The right ventricular size is mildly enlarged. There is mildly elevated pulmonary artery systolic pressure. The estimated right ventricular systolic pressure is 97.3 mmHg.  3. Left atrial size was mildly dilated.  4. Right atrial size was mildly dilated.  5. The mitral valve is normal in structure. Trivial mitral valve regurgitation. No evidence of mitral stenosis.  6. The aortic valve is normal in structure. Aortic valve regurgitation is not visualized. No aortic stenosis is present.  7. Aortic dilatation noted. There is mild dilatation at the level of the sinuses of Valsalva, measuring 38 mm.  8. The inferior vena cava is normal in size with greater than 50% respiratory variability, suggesting right atrial pressure of 3 mmHg.   Comparison(s): Prior images reviewed side by side. The left ventricular function has improved. 06/11/17 EF 45-50%.     Neuro/Psych  Neuromuscular disease    GI/Hepatic Neg liver ROS, GERD  ,  Endo/Other  negative endocrine ROS  Renal/GU negative Renal ROS     Musculoskeletal  (+) Arthritis ,   Abdominal   Peds  Hematology negative hematology ROS (+)   Anesthesia Other Findings   Reproductive/Obstetrics                           Anesthesia Physical Anesthesia Plan  ASA: 4  Anesthesia Plan: General   Post-op Pain Management: Tylenol PO (pre-op)* and Minimal or no pain anticipated   Induction: Intravenous  PONV Risk Score and Plan: 3 and Ondansetron, Dexamethasone and Treatment may vary due to age or medical condition  Airway Management Planned: LMA  Additional Equipment:   Intra-op Plan:   Post-operative Plan: Extubation in OR  Informed Consent: I have reviewed the patients History and Physical, chart, labs and discussed the procedure including the risks, benefits and alternatives for the proposed anesthesia with the patient or authorized representative who has indicated his/her understanding and acceptance.     Dental advisory given  Plan Discussed with: CRNA  Anesthesia Plan Comments: (See PAT note 01/22/22)      Anesthesia Quick Evaluation

## 2022-01-22 NOTE — Progress Notes (Signed)
Anesthesia Chart Review   Case: [id number] Date/Time: 01/23/22 1245   Procedure: CYSTOSCOPY WITH FULGERATION OF URETHRA AND BLADDER - 30 MINS   Anesthesia type: General   Pre-op diagnosis: GROSS HEMATURIA   Location: [place] / [facility] ORS   Surgeons: Franchot Gallo, MD       DISCUSSION:87 y.o. former smoker with h/o HTN, RA, OSA on CPAP, DVT s/p total hip 2012, RBBB, NICM, PAF, gross hematuria scheduled for above procedure 01/23/2022 with Dr. Franchot Gallo.   Pt last seen by cardiology 01/09/2022. Stable at this visit.   Xarelto has been held due to hematuria.   Anticipate pt can proceed with planned procedure barring acute status change.   VS: There were no vitals taken for this visit.  PROVIDERS: Copland, Gay Filler, MD is PCP   Kirk Ruths, MD is Cardiologist  LABS: Labs reviewed: Acceptable for surgery. (all labs ordered are listed, but only abnormal results are displayed)  Labs Reviewed - No data to display   IMAGES:   EKG:   CV: Echo 02/25/2020 1. Left ventricular ejection fraction, by estimation, is 50 to 55%. The  left ventricle has low normal function. The left ventricle has no regional  wall motion abnormalities. The left ventricular internal cavity size was  mildly dilated. Left ventricular  diastolic parameters are consistent with Grade I diastolic dysfunction  (impaired relaxation).   2. Right ventricular systolic function is normal. The right ventricular  size is mildly enlarged. There is mildly elevated pulmonary artery  systolic pressure. The estimated right ventricular systolic pressure is  16.5 mmHg.   3. Left atrial size was mildly dilated.   4. Right atrial size was mildly dilated.   5. The mitral valve is normal in structure. Trivial mitral valve  regurgitation. No evidence of mitral stenosis.   6. The aortic valve is normal in structure. Aortic valve regurgitation is  not visualized. No aortic stenosis is present.   7. Aortic  dilatation noted. There is mild dilatation at the level of the  sinuses of Valsalva, measuring 38 mm.   8. The inferior vena cava is normal in size with greater than 50%  respiratory variability, suggesting right atrial pressure of 3 mmHg Past Medical History:  Diagnosis Date   Benign localized prostatic hyperplasia with lower urinary tract symptoms (LUTS)    urologist-- dr Diona Fanti   Cancer [facility])    prostate   Chronic anticoagulation    Xarelto for afib   Chronic constipation    Chronic cystitis    Gross hematuria    History of DVT of lower extremity 06/08/2010   right femoral dvt dx post op total hip 05-08-2010   History of gastric ulcer    remote   History of kidney stones    one stone. remote hx   Hypercholesteremia    Hyperlipidemia    Hypertension    Dx by Dr. Lance Muss around age  33    Macular degeneration    dry   NICM (nonischemic cardiomyopathy) ([place])    per echo 06-11-2017,  ef 45-50% with diffuse hypokinesis,  G1DD   OSA on CPAP 2017   compliant with CPAP.  managed by Dr Elsworth Soho.   Osteoarthritis    Other urethral stricture, male, meatal    chronic;  pt self dilates   Paroxysmal atrial fibrillation [facility]) cardiologist-- dr Rayann Heman   first dx 2009/   a. documented by Dr Barrett Shell on office EKG 9/12. b. maintained on Tikosyn and Xarelto. c. s/p  DCCV 's.  d.  x3  EP w/ ablation atrial fib last one 12-05-2016   Prediabetes    Premature ventricular contraction    PVC's (premature ventricular contractions)    RA (rheumatoid arthritis) ([place])    rheumatologist--- Dr Page Spiro,  treated with Humira   RBBB (right bundle branch block)    Hx of a fib, ablation Aug 2018   Rheumatoid arthritis ([place])    Vitamin D deficiency    Wears hearing aid in both ears     Past Surgical History:  Procedure Laterality Date   ABLATION OF DYSRHYTHMIC FOCUS  08/29/2015   ATRIAL FIBRILLATION ABLATION  12/05/2016   ATRIAL FIBRILLATION ABLATION N/A 12/05/2016   Procedure: Atrial  Fibrillation Ablation;  Surgeon: [name], MD;  Location: [facility] CV LAB;  Service: Cardiovascular;  Laterality: N/A;   ATRIAL FIBRILLATION ABLATION N/A 02/29/2020   Procedure: ATRIAL FIBRILLATION ABLATION;  Surgeon: [name], MD;  Location: [facility] CV LAB;  Service: Cardiovascular;  Laterality: N/A;   ATRIAL FLUTTER ABLATION  09/2010   by [place]ford Bayley Right 1990s   CARDIOVERSION N/A 10/28/2012   Procedure: CARDIOVERSION;  Surgeon: Burnell Blanks, MD;  Location: [facility];  Service: Cardiovascular;  Laterality: N/A;   CATARACT EXTRACTION W/ INTRAOCULAR LENS  IMPLANT, BILATERAL  2019   CYSTOSCOPY WITH BIOPSY N/A 09/03/2018   Procedure: CYSTOSCOPY WITH FULGURATION Fran [place];  Surgeon: Franchot Gallo, MD;  Location: [facility] OR[facility];  Service: Urology;  Laterality: N/A;  [place] N/A 08/29/2015   Procedure: Atrial Fibrillation Ablation;  Surgeon: [name], MD;  Location: [facility] CV LAB;  Service: Cardiovascular;  Laterality: N/A;   KNEE SURGERY Bilateral x3   1960s & 1970s   open   LEFT HEART CATHETERIZATION WITH CORONARY ANGIOGRAM N/A 06/07/2011   Procedure: LEFT HEART CATHETERIZATION WITH CORONARY ANGIOGRAM;  Surgeon: Peter M Martinique, MD;  Location: [facility] CATH LAB;  Service: Cardiovascular;  Laterality: N/A;    Normal coronary arteries,  low normal LVF   TONSILLECTOMY AND ADENOIDECTOMY  age 71   TOTAL HIP ARTHROPLASTY Right 06/04/10    dr Noemi Chapel  '@[facility]'$    TOTAL KNEE ARTHROPLASTY Bilateral right 1995;  left [place]  06-29-2002   dr humphries  '@[facility]'$    TRANSURETHRAL RESECTION OF PROSTATE N/A 07/06/2018   Procedure: TRANSURETHRAL RESECTION OF THE PROSTATE (TURP);  Surgeon: Franchot Gallo, MD;  Location: [facility];  Service: Urology;  Laterality: N/A;  45 MINS    MEDICATIONS: No current facility-administered medications for this encounter.    acetaminophen (TYLENOL) 500 MG  tablet   atorvastatin (LIPITOR) 10 MG tablet   calcium carbonate (TUMS - DOSED IN MG ELEMENTAL CALCIUM) 500 MG chewable tablet   Carboxymethylcellulose Sodium (THERATEARS OP)   Cholecalciferol (VITAMIN D3) 5000 UNITS TABS   Cranberry 1000 MG CAPS   Docusate Calcium (STOOL SOFTENER PO)   finasteride (PROSCAR) 5 MG tablet   HUMIRA PEN 40 MG/0.4ML PNKT   lisinopril (ZESTRIL) 40 MG tablet   metoprolol succinate (TOPROL-XL) 50 MG 24 hr tablet   Multiple Vitamins-Minerals (PRESERVISION AREDS 2) CAPS   omeprazole (PRILOSEC) 20 MG capsule   solifenacin (VESICARE) 10 MG tablet   tamsulosin (FLOMAX) 0.4 MG CAPS capsule   Testosterone 20.25 MG/ACT (1.62%) GEL   vitamin B-12 (CYANOCOBALAMIN) 500 MCG tablet   rivaroxaban (XARELTO) 20 MG TABS tablet      [employer] [place], PA-C [employer]  Pre-Surgical Testing [phone]

## 2022-01-23 ENCOUNTER — Ambulatory Visit (HOSPITAL_BASED_OUTPATIENT_CLINIC_OR_DEPARTMENT_OTHER): Payer: Medicare PPO | Admitting: Physician Assistant

## 2022-01-23 ENCOUNTER — Encounter (HOSPITAL_COMMUNITY): Payer: Self-pay | Admitting: Urology

## 2022-01-23 ENCOUNTER — Ambulatory Visit (HOSPITAL_COMMUNITY): Payer: Medicare PPO | Admitting: Physician Assistant

## 2022-01-23 ENCOUNTER — Encounter (HOSPITAL_COMMUNITY)
Admission: RE | Disposition: A | Discharge status: Home / Self Care | Payer: Self-pay | Source: Home / Self Care | Attending: Urology

## 2022-01-23 ENCOUNTER — Other Ambulatory Visit: Payer: Self-pay

## 2022-01-23 ENCOUNTER — Ambulatory Visit (HOSPITAL_COMMUNITY)
Admission: RE | Admit: 2022-01-23 | Discharge: 2022-01-23 | Disposition: A | Discharge status: Home / Self Care | Payer: Medicare PPO | Attending: Urology | Admitting: Urology

## 2022-01-23 DIAGNOSIS — I10 Essential (primary) hypertension: Secondary | ICD-10-CM | POA: Insufficient documentation

## 2022-01-23 DIAGNOSIS — Z9889 Other specified postprocedural states: Secondary | ICD-10-CM | POA: Insufficient documentation

## 2022-01-23 DIAGNOSIS — Z87438 Personal history of other diseases of male genital organs: Secondary | ICD-10-CM | POA: Diagnosis not present

## 2022-01-23 DIAGNOSIS — Z8551 Personal history of malignant neoplasm of bladder: Secondary | ICD-10-CM | POA: Diagnosis not present

## 2022-01-23 DIAGNOSIS — N35919 Unspecified urethral stricture, male, unspecified site: Secondary | ICD-10-CM | POA: Insufficient documentation

## 2022-01-23 DIAGNOSIS — R310 Gross hematuria: Secondary | ICD-10-CM

## 2022-01-23 DIAGNOSIS — Z87891 Personal history of nicotine dependence: Secondary | ICD-10-CM | POA: Diagnosis not present

## 2022-01-23 DIAGNOSIS — N400 Enlarged prostate without lower urinary tract symptoms: Secondary | ICD-10-CM | POA: Insufficient documentation

## 2022-01-23 DIAGNOSIS — G4733 Obstructive sleep apnea (adult) (pediatric): Secondary | ICD-10-CM | POA: Insufficient documentation

## 2022-01-23 DIAGNOSIS — Z8546 Personal history of malignant neoplasm of prostate: Secondary | ICD-10-CM | POA: Diagnosis not present

## 2022-01-23 DIAGNOSIS — Z7901 Long term (current) use of anticoagulants: Secondary | ICD-10-CM | POA: Insufficient documentation

## 2022-01-23 DIAGNOSIS — Z9079 Acquired absence of other genital organ(s): Secondary | ICD-10-CM | POA: Diagnosis not present

## 2022-01-23 DIAGNOSIS — I480 Paroxysmal atrial fibrillation: Secondary | ICD-10-CM | POA: Diagnosis not present

## 2022-01-23 DIAGNOSIS — Z923 Personal history of irradiation: Secondary | ICD-10-CM | POA: Diagnosis not present

## 2022-01-23 DIAGNOSIS — C61 Malignant neoplasm of prostate: Secondary | ICD-10-CM | POA: Diagnosis not present

## 2022-01-23 DIAGNOSIS — I272 Pulmonary hypertension, unspecified: Secondary | ICD-10-CM | POA: Diagnosis not present

## 2022-01-23 HISTORY — PX: CYSTOSCOPY WITH FULGERATION: SHX6638

## 2022-01-23 SURGERY — CYSTOSCOPY, WITH BLADDER FULGURATION
Anesthesia: General

## 2022-01-23 MED ORDER — STERILE WATER FOR IRRIGATION IR SOLN
Status: DC | PRN
  Administered 2022-01-23: 500 mL

## 2022-01-23 MED ORDER — PROPOFOL 10 MG/ML IV BOLUS
INTRAVENOUS | Status: AC
  Filled 2022-01-23: qty 20

## 2022-01-23 MED ORDER — GLYCOPYRROLATE 0.2 MG/ML IJ SOLN
INTRAMUSCULAR | Status: DC | PRN
  Administered 2022-01-23: .2 mg via INTRAVENOUS

## 2022-01-23 MED ORDER — ONDANSETRON HCL 4 MG/2ML IJ SOLN
INTRAMUSCULAR | Status: DC | PRN
  Administered 2022-01-23: 4 mg via INTRAVENOUS

## 2022-01-23 MED ORDER — FENTANYL CITRATE PF 50 MCG/ML IJ SOSY
PREFILLED_SYRINGE | INTRAMUSCULAR | Status: AC
  Filled 2022-01-23: qty 1

## 2022-01-23 MED ORDER — LIDOCAINE 2% (20 MG/ML) 5 ML SYRINGE
INTRAMUSCULAR | Status: DC | PRN
  Administered 2022-01-23: 100 mg via INTRAVENOUS

## 2022-01-23 MED ORDER — SODIUM CHLORIDE 0.9 % IR SOLN
Status: DC | PRN
  Administered 2022-01-23: 6000 mL

## 2022-01-23 MED ORDER — ORAL CARE MOUTH RINSE: 15.0000 mL | Freq: Once | OROMUCOSAL | Status: AC

## 2022-01-23 MED ORDER — EPHEDRINE 5 MG/ML INJ
INTRAVENOUS | Status: AC
  Filled 2022-01-23: qty 5

## 2022-01-23 MED ORDER — ONDANSETRON HCL 4 MG/2ML IJ SOLN
INTRAMUSCULAR | Status: AC
  Filled 2022-01-23: qty 2

## 2022-01-23 MED ORDER — PROPOFOL 10 MG/ML IV BOLUS
INTRAVENOUS | Status: DC | PRN
  Administered 2022-01-23: 90 mg via INTRAVENOUS

## 2022-01-23 MED ORDER — 0.9 % SODIUM CHLORIDE (POUR BTL) OPTIME
TOPICAL | Status: DC | PRN
  Administered 2022-01-23: 1000 mL

## 2022-01-23 MED ORDER — FENTANYL CITRATE (PF) 100 MCG/2ML IJ SOLN
INTRAMUSCULAR | Status: AC
  Filled 2022-01-23: qty 2

## 2022-01-23 MED ORDER — LIDOCAINE HCL (PF) 2 % IJ SOLN
INTRAMUSCULAR | Status: AC
  Filled 2022-01-23: qty 5

## 2022-01-23 MED ORDER — SULFAMETHOXAZOLE-TRIMETHOPRIM 800-160 MG PO TABS: 1.0000 | ORAL_TABLET | Freq: Two times a day (BID) | ORAL | 0 refills | Status: DC

## 2022-01-23 MED ORDER — MEPERIDINE HCL 50 MG/ML IJ SOLN: 6.2500 mg | INTRAMUSCULAR | Status: DC | PRN

## 2022-01-23 MED ORDER — FENTANYL CITRATE PF 50 MCG/ML IJ SOSY
25.0000 ug | PREFILLED_SYRINGE | INTRAMUSCULAR | Status: DC | PRN
  Administered 2022-01-23: 50 ug via INTRAVENOUS

## 2022-01-23 MED ORDER — AMISULPRIDE (ANTIEMETIC) 5 MG/2ML IV SOLN: 5.0000 mg | Freq: Once | INTRAVENOUS | Status: DC | PRN

## 2022-01-23 MED ORDER — FENTANYL CITRATE (PF) 100 MCG/2ML IJ SOLN
INTRAMUSCULAR | Status: DC | PRN
  Administered 2022-01-23 (×2): 25 ug via INTRAVENOUS
  Administered 2022-01-23: 50 ug via INTRAVENOUS
  Administered 2022-01-23: 25 ug via INTRAVENOUS
  Administered 2022-01-23: 50 ug via INTRAVENOUS
  Administered 2022-01-23: 25 ug via INTRAVENOUS

## 2022-01-23 MED ORDER — DEXAMETHASONE SODIUM PHOSPHATE 10 MG/ML IJ SOLN
INTRAMUSCULAR | Status: AC
  Filled 2022-01-23: qty 1

## 2022-01-23 MED ORDER — CIPROFLOXACIN IN D5W 400 MG/200ML IV SOLN
400.0000 mg | INTRAVENOUS | Status: AC
  Administered 2022-01-23: 400 mg via INTRAVENOUS
  Filled 2022-01-23: qty 200

## 2022-01-23 MED ORDER — EPHEDRINE SULFATE-NACL 50-0.9 MG/10ML-% IV SOSY
PREFILLED_SYRINGE | INTRAVENOUS | Status: DC | PRN
  Administered 2022-01-23: 5 mg via INTRAVENOUS

## 2022-01-23 MED ORDER — DEXAMETHASONE SODIUM PHOSPHATE 4 MG/ML IJ SOLN
INTRAMUSCULAR | Status: DC | PRN
  Administered 2022-01-23: 5 mg via INTRAVENOUS

## 2022-01-23 MED ORDER — ACETAMINOPHEN 500 MG PO TABS
1000.0000 mg | ORAL_TABLET | Freq: Once | ORAL | Status: AC
  Administered 2022-01-23: 1000 mg via ORAL
  Filled 2022-01-23: qty 2

## 2022-01-23 MED ORDER — LACTATED RINGERS IV SOLN: INTRAVENOUS | Status: DC

## 2022-01-23 MED ORDER — CHLORHEXIDINE GLUCONATE 0.12 % MT SOLN
15.0000 mL | Freq: Once | OROMUCOSAL | Status: AC
  Administered 2022-01-23: 15 mL via OROMUCOSAL

## 2022-01-23 SURGICAL SUPPLY — 21 items
BAG URINE DRAIN 2000ML AR STRL (UROLOGICAL SUPPLIES) IMPLANT
BAG URINE LEG 500ML (DRAIN) IMPLANT
BAG URO CATCHER STRL LF (MISCELLANEOUS) ×1 IMPLANT
CATH FOLEY 2WAY SLVR  5CC 18FR (CATHETERS) ×1
CATH FOLEY 2WAY SLVR 30CC 24FR (CATHETERS) IMPLANT
CATH FOLEY 2WAY SLVR 5CC 18FR (CATHETERS) IMPLANT
DRAPE FOOT SWITCH (DRAPES) ×1 IMPLANT
EVACUATOR MICROVAS BLADDER (UROLOGICAL SUPPLIES) IMPLANT
GLOVE SURG LX STRL 8.0 MICRO (GLOVE) ×1 IMPLANT
GOWN STRL REUS W/ TWL XL LVL3 (GOWN DISPOSABLE) ×1 IMPLANT
GOWN STRL REUS W/TWL XL LVL3 (GOWN DISPOSABLE) ×1
KIT TURNOVER KIT A (KITS) IMPLANT
LOOP CUT BIPOLAR 24F LRG (ELECTROSURGICAL) ×1 IMPLANT
MANIFOLD NEPTUNE II (INSTRUMENTS) ×1 IMPLANT
NDL SAFETY ECLIP 18X1.5 (MISCELLANEOUS) ×1 IMPLANT
PACK CYSTO (CUSTOM PROCEDURE TRAY) ×1 IMPLANT
SYR TOOMEY IRRIG 70ML (MISCELLANEOUS) ×1
SYRINGE TOOMEY IRRIG 70ML (MISCELLANEOUS) IMPLANT
TUBING CONNECTING 10 (TUBING) ×1 IMPLANT
TUBING UROLOGY SET (TUBING) ×1 IMPLANT
WATER STERILE IRR 3000ML UROMA (IV SOLUTION) ×1 IMPLANT

## 2022-01-23 NOTE — Anesthesia Procedure Notes (Signed)
Procedure Name: LMA Insertion Date/Time: 01/23/2022 1:03 PM  Performed by: Claudia Desanctis, CRNAPre-anesthesia Checklist: Emergency Drugs available, Patient identified, Suction available and Patient being monitored Patient Re-evaluated:Patient Re-evaluated prior to induction Oxygen Delivery Method: Circle system utilized Preoxygenation: Pre-oxygenation with 100% oxygen Induction Type: IV induction Ventilation: Mask ventilation without difficulty LMA: LMA inserted LMA Size: 5.0 Number of attempts: 1 Placement Confirmation: positive ETCO2 and breath sounds checked- equal and bilateral Tube secured with: Tape Dental Injury: Teeth and Oropharynx as per pre-operative assessment

## 2022-01-23 NOTE — Op Note (Signed)
Preoperative diagnosis: History of bladder cancer and BPH, status post radiotherapy and TURP, recurrent gross hematuria  Postoperative diagnosis: Same  Principal procedure: Cystoscopy, fulguration of prostatic urethra  Surgeon: Boston Cookson  Anesthesia: General with LMA  Complications: None  Estimated blood loss: None  Specimen: None  Indications: 88 year old male presents at this time for fulguration of his prostatic urethra.  He has a history of BPH with a TURP both in 2004 and, more recently in 2020   He was found to have prostate cancer more recently.  He underwent radiotherapy for grade group 4 prostate cancer.  Additionally, he was treated with androgen deprivation therapy.  He is on Xarelto for atrial fibrillation.  Unfortunately, he has had recurrent bleeding necessitating catheterization, hospitalization and bladder irrigation.  He does not have significant hematuria when off of the Xarelto.  Because of his recurrent hematuria, he presents at this time for fulguration of his prostatic urethra, which is felt to be the source of his bleeding.  Findings: There was a mild stricture in his pendulous urethra.  Easily traversed with a 21 [place] scope.  Prostatic urethra was well resected.  Multiple telangiectatic vessels present.  Very minimal nodular regrowth of this tissue.  Urothelium of the bladder was normal.  Very mild degree of telangiectatic vessels at the bladder neck adjacent to the prostatic urethra.  Moderate trabeculations.  Description of procedure: The patient was properly identified in the holding area.  Taken to the operating room where general anesthetic was administered with the LMA.  He was placed in the dorsolithotomy position.  Genitalia and perineum were prepped, draped, proper timeout performed.  21 French panendoscope passed under direct vision with the above-mentioned findings noted.  Following this, the scope was removed.  Urethra dilated to 71 [place] with [employer]  sounds.  Resectoscope sheath placed using the visual obturator.  Resectoscope and cutting loop were then placed.  Careful fulguration was done of the entire prostatic urethra, circumferentially.  Small telangiectatic vessels inferior to the trigone were also cauterized, mainly posteriorly.  Care was taken to avoid cauterization near the ureteral orifices.  At this point, there was no evidence of telangiectatic vessels noted either in the prostatic urethra at the bladder neck.  The scope was removed.  I then passed an 49 [place] Foley catheter.  Balloon filled with 10 cc of water, hooked to dependent drainage.  At this point, the procedure was terminated.  The patient was awakened, taken to the PACU in stable condition, having tolerated the procedure well.

## 2022-01-23 NOTE — Discharge Instructions (Addendum)
You may see some blood in the urine and may have some burning with urination for 48-72 hours. You also may notice that you have to urinate more frequently or urgently after your procedure which is normal.  You should call should you develop an inability urinate, fever > 101, persistent nausea and vomiting that prevents you from eating or drinking to stay hydrated. If you have a catheter, you will be taught how to take care of the catheter by the nursing staff prior to discharge from the hospital.  You may periodically feel a strong urge to void with the catheter in place.  This is a bladder spasm and most often can occur when having a bowel movement or moving around. It is typically self-limited and usually will stop after a few minutes.  You may use some Vaseline or Neosporin around the tip of the catheter to reduce friction at the tip of the penis. You may also see some blood in the urine.  A very small amount of blood can make the urine look quite red.  As long as the catheter is draining well, there usually is not a problem.  However, if the catheter is not draining well and is bloody, you should call the office [phone]) to notify us.  It is okay to remove the catheter as instructed on Thursday morning. It is okay to resume the Xarelto later this week, once the urine turns totally yellow.

## 2022-01-23 NOTE — H&P (Signed)
H&P  Chief Complaint: Blood in urine  History of Present Illness: This gentleman presents for cystoscopy and fulguration of the prostatic urethra as well as bladder for recurrent gross hematuria necessitating catheter placement and, at times, hospitalization.  He has had a TURP in the past as well as radiotherapy for curative management of prostate cancer.  Unfortunately, he is on anticoagulation for history of atrial fibrillation.  He has had to remain off of this recently because of his recurrent hematuria.  Past Medical History:  Diagnosis Date   Benign localized prostatic hyperplasia with lower urinary tract symptoms (LUTS)    urologist-- dr Diona Fanti   Cancer [facility])    prostate   Chronic anticoagulation    Xarelto for afib   Chronic constipation    Chronic cystitis    Gross hematuria    History of DVT of lower extremity 06/08/2010   right femoral dvt dx post op total hip 05-08-2010   History of gastric ulcer    remote   History of kidney stones    one stone. remote hx   Hypercholesteremia    Hyperlipidemia    Hypertension    Dx by Dr. Lance Muss around age  12    Macular degeneration    dry   NICM (nonischemic cardiomyopathy) ([place])    per echo 06-11-2017,  ef 45-50% with diffuse hypokinesis,  G1DD   OSA on CPAP 2017   compliant with CPAP.  managed by Dr Elsworth Soho.   Osteoarthritis    Other urethral stricture, male, meatal    chronic;  pt self dilates   Paroxysmal atrial fibrillation [facility]) cardiologist-- dr Rayann Heman   first dx 2009/   a. documented by Dr Barrett Shell on office EKG 9/12. b. maintained on Tikosyn and Xarelto. c. s/p DCCV 's.  d.  x3  EP w/ ablation atrial fib last one 12-05-2016   Prediabetes    Premature ventricular contraction    PVC's (premature ventricular contractions)    RA (rheumatoid arthritis) ([place])    rheumatologist--- Dr Page Spiro,  treated with Humira   RBBB (right bundle branch block)    Hx of a fib, ablation Aug 2018   Rheumatoid arthritis ([place])     Vitamin D deficiency    Wears hearing aid in both ears     Past Surgical History:  Procedure Laterality Date   ABLATION OF DYSRHYTHMIC FOCUS  08/29/2015   ATRIAL FIBRILLATION ABLATION  12/05/2016   ATRIAL FIBRILLATION ABLATION N/A 12/05/2016   Procedure: Atrial Fibrillation Ablation;  Surgeon: [name], MD;  Location: [place] CV LAB;  Service: Cardiovascular;  Laterality: N/A;   ATRIAL FIBRILLATION ABLATION N/A 02/29/2020   Procedure: ATRIAL FIBRILLATION ABLATION;  Surgeon: [name], MD;  Location: [place] CV LAB;  Service: Cardiovascular;  Laterality: N/A;   ATRIAL FLUTTER ABLATION  09/2010   by Glenford Bayley Right 1990s   CARDIOVERSION N/A 10/28/2012   Procedure: CARDIOVERSION;  Surgeon: Burnell Blanks, MD;  Location: [place];  Service: Cardiovascular;  Laterality: N/A;   CATARACT EXTRACTION W/ INTRAOCULAR LENS  IMPLANT, BILATERAL  2019   CYSTOSCOPY WITH BIOPSY N/A 09/03/2018   Procedure: CYSTOSCOPY WITH FULGURATION Fran Lowes;  Surgeon: Franchot Gallo, MD;  Location: [facility];  Service: Urology;  Laterality: N/A;  [place] N/A 08/29/2015   Procedure: Atrial Fibrillation Ablation;  Surgeon: [name], MD;  Location: [place] CV LAB;  Service: Cardiovascular;  Laterality: N/A;   KNEE  SURGERY Bilateral x3   1960s & 1970s   open   LEFT HEART CATHETERIZATION WITH CORONARY ANGIOGRAM N/A 06/07/2011   Procedure: LEFT HEART CATHETERIZATION WITH CORONARY ANGIOGRAM;  Surgeon: Peter M Martinique, MD;  Location: [facility] CATH LAB;  Service: Cardiovascular;  Laterality: N/A;    Normal coronary arteries,  low normal LVF   TONSILLECTOMY AND ADENOIDECTOMY  age 8   TOTAL HIP ARTHROPLASTY Right 06/04/10    dr Noemi Chapel  '@[facility]'$    TOTAL KNEE ARTHROPLASTY Bilateral right 1995;  left [place]  06-29-2002   dr humphries  '@[facility]'$    TRANSURETHRAL RESECTION OF PROSTATE N/A 07/06/2018   Procedure: TRANSURETHRAL  RESECTION OF THE PROSTATE (TURP);  Surgeon: Franchot Gallo, MD;  Location: [facility];  Service: Urology;  Laterality: N/A;  45 MINS    Home Medications:    Allergies:  Allergies  Allergen Reactions   Penicillins Itching, Swelling and Other (See Comments)    Did it involve swelling of the face/tongue/throat, SOB, or low BP? No Did it involve sudden or severe rash/hives, skin peeling, or any reaction on the inside of your mouth or nose? No Did you need to seek medical attention at a hospital or doctor's office? No When did it last happen?      30 + years If all above answers are "NO", may proceed with cephalosporin use.      Family History  Problem Relation Age of Onset   Heart attack Father    Lung disease Mother    Arrhythmia Sister    Atrial fibrillation Brother    Diabetes Neg Hx     Social History:  reports that he quit smoking about 43 years ago. His smoking use included cigarettes. He has a 30.00 pack-year smoking history. He has never used smokeless tobacco. He reports current alcohol use of about 1.0 standard drink of alcohol per week. He reports that he does not use drugs.  ROS: A complete review of systems was performed.  All systems are negative except for pertinent findings as noted.  Physical Exam:  [facility]: Grossly intact, no focal deficits Psychiatric: Normal mood and affect  I have reviewed prior pt notes  I have reviewed notes from referring/previous physicians  I have reviewed urinalysis results  I have independently reviewed prior imaging  I have reviewed prior PSA results  I have reviewed prior urine culture   Impression/Assessment:  Gross hematuria, prostatic and bladder source following TURP and radiotherapy for prostate cancer  Plan:  Cystoscopy and fulguration of abnormal prostatic and bladder source

## 2022-01-23 NOTE — Transfer of Care (Signed)
Immediate Anesthesia Transfer of Care Note  Patient: Connor [name]  Procedure(s) Performed: [place] OF PROSTATIC URETHRA  Patient Location: PACU  Anesthesia Type:General  Level of Consciousness: awake and patient cooperative  Airway & Oxygen Therapy: Patient Spontanous Breathing and Patient connected to face mask  Post-op Assessment: Report given to RN and Post -op Vital signs reviewed and stable  Post vital signs: Reviewed and stable  Last Vitals:  Vitals Value Taken Time  BP 149/78 01/23/22 1334  Temp    Pulse 78 01/23/22 1336  Resp 12 01/23/22 1336  SpO2 100 % 01/23/22 1336  Vitals shown include unvalidated device data.  Last Pain:  Vitals:   01/23/22 1144  TempSrc:   PainSc: 0-No pain         Complications: No notable events documented.

## 2022-01-23 NOTE — Anesthesia Postprocedure Evaluation (Signed)
Anesthesia Post Note  Patient: Connor [name]  Procedure(s) Performed: CYSTOSCOPY WITH FULGERATION OF PROSTATIC URETHRA     Patient location during evaluation: PACU Anesthesia Type: General Level of consciousness: sedated and patient cooperative Pain management: pain level controlled Vital Signs Assessment: post-procedure vital signs reviewed and stable Respiratory status: spontaneous breathing Cardiovascular status: stable Anesthetic complications: no   No notable events documented.  Last Vitals:  Vitals:   01/23/22 1432 01/23/22 1450  BP: (!) 150/87 (!) 161/89  Pulse: 64 67  Resp: 14 16  Temp:  (!) 36.4 C  SpO2: 93% 96%    Last Pain:  Vitals:   01/23/22 1450  TempSrc: Oral  PainSc: 0-No pain                 Nolon Nations

## 2022-01-24 ENCOUNTER — Encounter: Payer: Self-pay | Admitting: Cardiology

## 2022-01-24 ENCOUNTER — Encounter (HOSPITAL_COMMUNITY): Payer: Self-pay | Admitting: Urology

## 2022-01-28 DIAGNOSIS — Z8546 Personal history of malignant neoplasm of prostate: Secondary | ICD-10-CM | POA: Diagnosis not present

## 2022-01-28 DIAGNOSIS — R310 Gross hematuria: Secondary | ICD-10-CM | POA: Diagnosis not present

## 2022-01-29 ENCOUNTER — Ambulatory Visit (HOSPITAL_COMMUNITY): Payer: Medicare PPO | Admitting: Nurse Practitioner

## 2022-01-30 ENCOUNTER — Encounter (HOSPITAL_COMMUNITY): Payer: Self-pay | Admitting: Nurse Practitioner

## 2022-01-30 ENCOUNTER — Ambulatory Visit (HOSPITAL_COMMUNITY)
Admission: RE | Admit: 2022-01-30 | Discharge: 2022-01-30 | Disposition: A | Discharge status: Home / Self Care | Payer: Medicare PPO | Source: Ambulatory Visit | Attending: Nurse Practitioner | Admitting: Nurse Practitioner

## 2022-01-30 VITALS — BP 124/80 | HR 75 | Ht 72.0 in | Wt 219.4 lb

## 2022-01-30 DIAGNOSIS — Z7901 Long term (current) use of anticoagulants: Secondary | ICD-10-CM | POA: Insufficient documentation

## 2022-01-30 DIAGNOSIS — D6869 Other thrombophilia: Secondary | ICD-10-CM | POA: Diagnosis not present

## 2022-01-30 DIAGNOSIS — I428 Other cardiomyopathies: Secondary | ICD-10-CM | POA: Diagnosis not present

## 2022-01-30 DIAGNOSIS — I480 Paroxysmal atrial fibrillation: Secondary | ICD-10-CM | POA: Insufficient documentation

## 2022-01-30 DIAGNOSIS — I10 Essential (primary) hypertension: Secondary | ICD-10-CM | POA: Diagnosis not present

## 2022-01-30 DIAGNOSIS — G4733 Obstructive sleep apnea (adult) (pediatric): Secondary | ICD-10-CM | POA: Insufficient documentation

## 2022-01-30 DIAGNOSIS — E785 Hyperlipidemia, unspecified: Secondary | ICD-10-CM | POA: Insufficient documentation

## 2022-01-30 DIAGNOSIS — I493 Ventricular premature depolarization: Secondary | ICD-10-CM | POA: Diagnosis not present

## 2022-01-30 MED ORDER — METOPROLOL SUCCINATE ER 50 MG PO TB24: 50.0000 mg | ORAL_TABLET | Freq: Two times a day (BID) | ORAL | 3 refills | Status: DC

## 2022-01-30 NOTE — Patient Instructions (Signed)
Increase metoprolol to 50mg twice a day 

## 2022-01-30 NOTE — Progress Notes (Signed)
Primary Care Physician: Darreld Mclean, MD Primary Electrophysiologist: Dr Rayann Heman Referring Physician: Dr Nena Polio III is a 88 y.o. male with a history of HTN, HLD, NICM, OSA, and atrial fibrillation who presents for follow up in the [facility]. Patient is on Xarelto for a CHADS2VASC score of 4. Patient has had afib ablations in 2017 and 2018. His most recent ablation was on 02/29/20 with Dr Rayann Heman. Patient reports he has felt well since ablation. He brings in his Kardia strips for review which shows 3-4 episodes since ablation, longest lasting 48 hours. These were rate controlled. His overall afib burden has improved. He denies CP, swallowing, or groin issues.   F/u in afib clinic, 01/30/22 for pt noticing more afib over the last several weeks in the setting of hematuria with drop pf hemoglobin to 9.2 from 12.7 in August. He just had a procedure to cauterize bladder wall and hematuria has improved since then. He feels this all stemmed from radiation to bladder wall in 2020. He is in SR today.   Today, he denies symptoms of palpitations, chest pain, shortness of breath, orthopnea, PND, lower extremity edema, dizziness, presyncope, syncope, snoring, daytime somnolence, bleeding, or neurologic sequela. The patient is tolerating medications without difficulties and is otherwise without complaint today.    Atrial Fibrillation Risk Factors:  he does have symptoms or diagnosis of sleep apnea. he is compliant with CPAP therapy. he does not have a history of rheumatic fever. he does not have a history of alcohol use.   he has a BMI of There is no height or weight on file to calculate BMI.. There were no vitals filed for this visit.   Family History  Problem Relation Age of Onset   Heart attack Father    Lung disease Mother    Arrhythmia Sister    Atrial fibrillation Brother    Diabetes Neg Hx      Atrial Fibrillation Management  history:  Previous antiarrhythmic drugs: dofetilide  Previous cardioversions: 2014 Previous ablations: 2017, 2018, 02/29/20 CHADS2VASC score: 4 Anticoagulation history: Xarelto   Past Medical History:  Diagnosis Date   Benign localized prostatic hyperplasia with lower urinary tract symptoms (LUTS)    urologist-- dr Diona Fanti   Cancer [facility])    prostate   Chronic anticoagulation    Xarelto for afib   Chronic constipation    Chronic cystitis    Gross hematuria    History of DVT of lower extremity 06/08/2010   right femoral dvt dx post op total hip 05-08-2010   History of gastric ulcer    remote   History of kidney stones    one stone. remote hx   Hypercholesteremia    Hyperlipidemia    Hypertension    Dx by Dr. Lance Muss around age  58    Macular degeneration    dry   NICM (nonischemic cardiomyopathy) ([place])    per echo 06-11-2017,  ef 45-50% with diffuse hypokinesis,  G1DD   OSA on CPAP 2017   compliant with CPAP.  managed by Dr Elsworth Soho.   Osteoarthritis    Other urethral stricture, male, meatal    chronic;  pt self dilates   Paroxysmal atrial fibrillation [facility]) cardiologist-- dr Rayann Heman   first dx 2009/   a. documented by Dr Barrett Shell on office EKG 9/12. b. maintained on Tikosyn and Xarelto. c. s/p DCCV 's.  d.  x3  EP w/ ablation atrial fib last one 12-05-2016  Prediabetes    Premature ventricular contraction    PVC's (premature ventricular contractions)    RA (rheumatoid arthritis) ([place])    rheumatologist--- Dr Page Spiro,  treated with Humira   RBBB (right bundle branch block)    Hx of a fib, ablation Aug 2018   Rheumatoid arthritis ([place])    Vitamin D deficiency    Wears hearing aid in both ears    Past Surgical History:  Procedure Laterality Date   ABLATION OF DYSRHYTHMIC FOCUS  08/29/2015   ATRIAL FIBRILLATION ABLATION  12/05/2016   ATRIAL FIBRILLATION ABLATION N/A 12/05/2016   Procedure: Atrial Fibrillation Ablation;  Surgeon: [name], MD;  Location: [place] CV LAB;  Service: Cardiovascular;  Laterality: N/A;   ATRIAL FIBRILLATION ABLATION N/A 02/29/2020   Procedure: ATRIAL FIBRILLATION ABLATION;  Surgeon: [name], MD;  Location: [place] CV LAB;  Service: Cardiovascular;  Laterality: N/A;   ATRIAL FLUTTER ABLATION  09/2010   by Glenford Bayley Right 1990s   CARDIOVERSION N/A 10/28/2012   Procedure: CARDIOVERSION;  Surgeon: Burnell Blanks, MD;  Location: [place];  Service: Cardiovascular;  Laterality: N/A;   CATARACT EXTRACTION W/ INTRAOCULAR LENS  IMPLANT, BILATERAL  2019   CYSTOSCOPY WITH BIOPSY N/A 09/03/2018   Procedure: CYSTOSCOPY WITH FULGURATION Fran Lowes;  Surgeon: Franchot Gallo, MD;  [facility];  Service: Urology;  Laterality: N/A;  [place] N/A 01/23/2022   Procedure: CYSTOSCOPY WITH FULGERATION OF PROSTATIC URETHRA;  Surgeon: Franchot Gallo, MD;  [facility];  Service: Urology;  Laterality: N/A;  [place] N/A 08/29/2015   Procedure: Atrial Fibrillation Ablation;  Surgeon: [name], MD;  Location: [place] CV LAB;  Service: Cardiovascular;  Laterality: N/A;   KNEE SURGERY Bilateral x3   1960s & 1970s   open   LEFT HEART CATHETERIZATION WITH CORONARY ANGIOGRAM N/A 06/07/2011   Procedure: LEFT HEART CATHETERIZATION WITH CORONARY ANGIOGRAM;  Surgeon: Peter M Martinique, MD;  Location: [facility] CATH LAB;  Service: Cardiovascular;  Laterality: N/A;    Normal coronary arteries,  low normal LVF   TONSILLECTOMY AND ADENOIDECTOMY  age 39   TOTAL HIP ARTHROPLASTY Right 06/04/10    dr Noemi Chapel  '@[facility]'$    TOTAL KNEE ARTHROPLASTY Bilateral right 1995;  left [place]  06-29-2002   dr humphries  '@[facility]'$    TRANSURETHRAL RESECTION OF PROSTATE N/A 07/06/2018   Procedure: TRANSURETHRAL RESECTION OF THE PROSTATE (TURP);  Surgeon: Franchot Gallo, MD;  Location: [facility];  Service: Urology;   Laterality: N/A;  45 MINS    Current Outpatient Medications  Medication Sig Dispense Refill   acetaminophen (TYLENOL) 500 MG tablet Take 1,000 mg by mouth 2 (two) times daily as needed for moderate pain.     atorvastatin (LIPITOR) 10 MG tablet TAKE 1 TABLET BY MOUTH EVERY DAY (Patient taking differently: Take 10 mg by mouth daily.) 90 tablet 3   calcium carbonate (TUMS - DOSED IN MG ELEMENTAL CALCIUM) 500 MG chewable tablet Chew 1 tablet by mouth daily as needed for heartburn.     Carboxymethylcellulose Sodium (THERATEARS OP) Apply 1 drop to eye daily as needed (dry eyes).     Cholecalciferol (VITAMIN D3) 5000 UNITS TABS Take 5,000 Units by mouth daily.     Cranberry 1000 MG CAPS Take 3,000 mg by mouth at bedtime.     Docusate Calcium (STOOL SOFTENER PO) Take 4 tablets  by mouth at bedtime.     finasteride (PROSCAR) 5 MG tablet Take 5 mg by mouth every morning.   3   HUMIRA PEN 40 MG/0.4ML PNKT Inject 40 mg as directed every 7 (seven) days. Every Tuesday  6   lisinopril (ZESTRIL) 40 MG tablet TAKE 1 TABLET BY MOUTH EVERY DAY (Patient taking differently: Take 40 mg by mouth at bedtime.) 90 tablet 3   metoprolol succinate (TOPROL-XL) 50 MG 24 hr tablet TAKE 1/2 TABLET IN THE MORNING AND TAKE 1 TABLET IN THE EVENING (Patient taking differently: Take 25-50 mg by mouth See admin instructions. Take 25 mg by mouth in the morning and 50 mg in the evening) 135 tablet 3   Multiple Vitamins-Minerals (PRESERVISION AREDS 2) CAPS Take 1 capsule by mouth 2 (two) times a day.     omeprazole (PRILOSEC) 20 MG capsule Take 20 mg by mouth daily.     rivaroxaban (XARELTO) 20 MG TABS tablet Take 20 mg by mouth daily with supper. (Patient not taking: Reported on 01/22/2022)     solifenacin (VESICARE) 10 MG tablet Take 10 mg by mouth at bedtime.      sulfamethoxazole-trimethoprim (BACTRIM DS) 800-160 MG tablet Take 1 tablet by mouth 2 (two) times daily. 6 tablet 0   tamsulosin (FLOMAX) 0.4 MG CAPS capsule Take 1 capsule  (0.4 mg total) by mouth daily after supper. 30 capsule 5   Testosterone 20.25 MG/ACT (1.62%) GEL Apply 1 Pump topically at bedtime. Apply to shoulders     vitamin B-12 (CYANOCOBALAMIN) 500 MCG tablet Take 500 mcg by mouth every evening.      No current facility-administered medications for this encounter.    Allergies  Allergen Reactions   Penicillins Itching, Swelling and Other (See Comments)    Did it involve swelling of the face/tongue/throat, SOB, or low BP? No Did it involve sudden or severe rash/hives, skin peeling, or any reaction on the inside of your mouth or nose? No Did you need to seek medical attention at a hospital or doctor's office? No When did it last happen?      30 + years If all above answers are "NO", may proceed with cephalosporin use.      Social History   Socioeconomic History   Marital status: Married    Spouse name: Joycelyn Schmid   Number of children: Not on file   Years of education: Not on file   Highest education level: Not on file  Occupational History   Occupation: Retired  Tobacco Use   Smoking status: Former    Packs/day: 1.00    Years: 30.00    Total pack years: 30.00    Types: Cigarettes    Quit date: 05/06/1978    Years since quitting: 43.7   Smokeless tobacco: Never   Tobacco comments:    Former smoker 03/14/2021  Vaping Use   Vaping Use: Never used  Substance and Sexual Activity   Alcohol use: Yes    Alcohol/week: 1.0 standard drink of alcohol    Types: 1 Cans of beer per week    Comment: occasionally wine and beer   Drug use: Never   Sexual activity: Not on file  Other Topics Concern   Not on file  Social History Narrative   he patient lives in [place] with his spouse.  Retired.   Social Determinants of Health   Financial Resource Strain: Low Risk  (06/21/2021)   Overall Financial Resource Strain (CARDIA)    Difficulty of Paying Living Expenses: Not  hard at all  Food Insecurity: No Food Insecurity (01/08/2022)   Hunger Vital  Sign    Worried About Running Out of Food in the Last Year: Never true    Ran Out of Food in the Last Year: Never true  Transportation Needs: No Transportation Needs (01/08/2022)   PRAPARE - Hydrologist (Medical): No    Lack of Transportation (Non-Medical): No  Physical Activity: Insufficiently Active (06/21/2021)   Exercise Vital Sign    Days of Exercise per Week: 2 days    Minutes of Exercise per Session: 50 min  Stress: No Stress Concern Present (06/08/2020)   [place]    Feeling of Stress : Not at all  Social Connections: Moderately Isolated (06/21/2021)   Social Connection and Isolation Panel [NHANES]    Frequency of Communication with Friends and Family: More than three times a week    Frequency of Social Gatherings with Friends and Family: More than three times a week    Attends Religious Services: Never    Marine scientist or Organizations: No    Attends Archivist Meetings: Never    Marital Status: Married  Human resources officer Violence: Not At Risk (06/21/2021)   Humiliation, Afraid, Rape, and Kick questionnaire    Fear of Current or Ex-Partner: No    Emotionally Abused: No    Physically Abused: No    Sexually Abused: No     ROS- All systems are reviewed and negative except as per the HPI above.  Physical Exam: There were no vitals filed for this visit.   GEN- The patient is well appearing obese elderly male, alert and oriented x 3 today.   Head- normocephalic, atraumatic Eyes-  Sclera clear, conjunctiva pink Ears- hearing intact Oropharynx- clear Neck- supple  Lungs- Clear to ausculation bilaterally, normal work of breathing Heart- Regular rate and rhythm, no murmurs, rubs or gallops  GI- soft, NT, ND, + BS Extremities- no clubbing, cyanosis, or edema MS- no significant deformity or atrophy Skin- no rash or lesion Psych- euthymic mood, full affect Neuro-  strength and sensation are intact  Wt Readings from Last 3 Encounters:  01/23/22 99.8 kg  01/22/22 99.8 kg  01/09/22 101.9 kg    EKG today demonstrates SR HR 77, RBBB, PR 164, QRS 140, QTc 516  Echo 02/25/20 demonstrated  1. Left ventricular ejection fraction, by estimation, is 50 to 55%. The  left ventricle has low normal function. The left ventricle has no regional  wall motion abnormalities. The left ventricular internal cavity size was  mildly dilated. Left ventricular  diastolic parameters are consistent with Grade I diastolic dysfunction  (impaired relaxation).   2. Right ventricular systolic function is normal. The right ventricular  size is mildly enlarged. There is mildly elevated pulmonary artery  systolic pressure. The estimated right ventricular systolic pressure is  15.1 mmHg.   3. Left atrial size was mildly dilated.   4. Right atrial size was mildly dilated.   5. The mitral valve is normal in structure. Trivial mitral valve  regurgitation. No evidence of mitral stenosis.   6. The aortic valve is normal in structure. Aortic valve regurgitation is  not visualized. No aortic stenosis is present.   7. Aortic dilatation noted. There is mild dilatation at the level of the  sinuses of Valsalva, measuring 38 mm.   8. The inferior vena cava is normal in size with greater than 50%  respiratory variability, suggesting right atrial pressure of 3 mmHg.   Comparison(s): Prior images reviewed side by side. The left ventricular  function has improved. 06/11/17 EF 45-50%.   Epic records are reviewed at length today  CHA2DS2-VASc Score =    The patient's score is based upon:        ASSESSMENT AND PLAN: 1. Paroxysmal Atrial Fibrillation (ICD10:  I48.0) S/p repeat ablation 02/29/20 Had been doing well until recent increase in afib since hematuria/anemia and recent procedure to bladder wall In SR today Will increase metoprolol to 50 mg am and pm  Continue Xarelto 20 mg daily,  just restarted 9/25 Kardia for home monitoring.   2. Secondary Hypercoagulable State (ICD10:  D68.69) The patient is at significant risk for stroke/thromboembolism based upon his CHA2DS2-VASc Score of 3 .  Continue Rivaroxaban (Xarelto).   3. Obstructive sleep apnea Patient reports compliance with CPAP therapy.  4. NICM EF 50-55% No signs or symptoms of fluid overload.  5. HTN Stable     I will refer to Dr. Quentin Ore to establish in Dr. Jackalyn Lombard absence and to f/u hematuria if surgery is not successful and needs consideration for [place]. Mila Homer [facility] [address] [place], [place] [address] [phone]  8:49 AM

## 2022-02-08 ENCOUNTER — Encounter (INDEPENDENT_AMBULATORY_CARE_PROVIDER_SITE_OTHER): Payer: Medicare PPO | Admitting: Family Medicine

## 2022-02-08 DIAGNOSIS — U071 COVID-19: Secondary | ICD-10-CM

## 2022-02-08 MED ORDER — MOLNUPIRAVIR EUA 200MG CAPSULE: 4.0000 | ORAL_CAPSULE | Freq: Two times a day (BID) | ORAL | 0 refills | Status: AC

## 2022-02-08 NOTE — Addendum Note (Signed)
Addended by: Lamar Blinks C on: 02/08/2022 01:08 PM   Modules accepted: Orders

## 2022-02-08 NOTE — Telephone Encounter (Signed)
FYI:   Pt is aware that you are not in office- Ive offered a VV with one of the other providers.

## 2022-02-08 NOTE — Telephone Encounter (Signed)
Please see the MyChart message reply(ies) for my assessment and plan.  The patient gave consent for this Medical Advice Message and is aware that it may result in a bill to their insurance company as well as the possibility that this may result in a co-payment or deductible. They are an established patient, but are not seeking medical advice exclusively about a problem treated during an in person or video visit in the last 7 days. I did not recommend an in person or video visit within 7 days of my reply.  I spent a total of 8 minutes cumulative time within 7 days through MyChart messaging Tyhesha Dutson, MD  

## 2022-02-27 DIAGNOSIS — R350 Frequency of micturition: Secondary | ICD-10-CM | POA: Diagnosis not present

## 2022-03-04 ENCOUNTER — Encounter (HOSPITAL_COMMUNITY): Payer: Self-pay

## 2022-03-04 DIAGNOSIS — M2011 Hallux valgus (acquired), right foot: Secondary | ICD-10-CM | POA: Diagnosis not present

## 2022-03-04 DIAGNOSIS — B351 Tinea unguium: Secondary | ICD-10-CM | POA: Diagnosis not present

## 2022-03-04 DIAGNOSIS — L609 Nail disorder, unspecified: Secondary | ICD-10-CM | POA: Diagnosis not present

## 2022-03-04 DIAGNOSIS — I739 Peripheral vascular disease, unspecified: Secondary | ICD-10-CM | POA: Diagnosis not present

## 2022-03-04 DIAGNOSIS — L603 Nail dystrophy: Secondary | ICD-10-CM | POA: Diagnosis not present

## 2022-03-04 DIAGNOSIS — M2012 Hallux valgus (acquired), left foot: Secondary | ICD-10-CM | POA: Diagnosis not present

## 2022-03-04 DIAGNOSIS — L84 Corns and callosities: Secondary | ICD-10-CM | POA: Diagnosis not present

## 2022-03-11 DIAGNOSIS — N401 Enlarged prostate with lower urinary tract symptoms: Secondary | ICD-10-CM | POA: Diagnosis not present

## 2022-03-11 DIAGNOSIS — R3914 Feeling of incomplete bladder emptying: Secondary | ICD-10-CM | POA: Diagnosis not present

## 2022-03-11 DIAGNOSIS — R310 Gross hematuria: Secondary | ICD-10-CM | POA: Diagnosis not present

## 2022-03-14 NOTE — Patient Instructions (Addendum)
Good to see you again today- I would recommend the latest covid booster and a dose of RSV if not done already  I will be in touch with your labs Let me know if anything comes up- otherwise let's visit in 4-6 months PT referral made for you!

## 2022-03-14 NOTE — Progress Notes (Addendum)
[place] at [facility] [address], [place], [place] [address] [phone]  Date:  03/18/2022   Name:  Connor [name]   DOB:  1933-08-20   MRN:  [id number]  PCP:  Darreld Mclean, MD    Chief Complaint: Annual Exam (Concerns/ questions: endurance/ stability/stamina. /Flu shot today: [facility]/)   History of Present Illness:  Connor [name] is a 88 y.o. very pleasant male patient who presents with the following:  Pt seen today for a CPE History of atrial fibrillation, left ventricle dysfunction, prostate cancer, seronegative rheumatoid arthritis and osteoarthritis, sleep apnea.  Has some difficulty using his CPAP and typically uses it for just an hour or 2 nightly He does IM humira weekly for his RA He notes his wrist and hand strength is not as good as it was in the past- can be harder to open a jar, etc   Last seen by myself in May of this year  He was admitted in September with gross hematuria: History of Present Illness: This gentleman presents for cystoscopy and fulguration of the prostatic urethra as well as bladder for recurrent gross hematuria necessitating catheter placement and, at times, hospitalization.  He has had a TURP in the past as well as radiotherapy for curative management of prostate cancer.  Unfortunately, he is on anticoagulation for history of atrial fibrillation.  He has had to remain off of this recently because of his recurrent hematuria   Pt notes he was in the ER a few times with urinary complications/ clots blocking his voiding prior to his admission and surgical treatment  He notes he is able to void again now, he is being treated for UTI per Dr Burman Riis with [facility] currently  No other bleeding noted  He is on xarelto still for his A fib He does go to A fib clinic as well- they adjusted his BB recently and referred him to electrophysiology. We wonder if there is any chance he can come off  xarelto in the future   He typically sees Dr Elsworth Soho for his sleep apnea once a year- notes he is not using cpap right now as he has trouble tolerating this.  He did order one more type of mask that he is awaiting  He has been on CPAP since 2015  He would like to re-start PT at [facility].  Working on strength and stamina   They got a new puppy- a goldendoodle - who is 71 months old right now   Wt Readings from Last 3 Encounters:  03/18/22 218 lb (98.9 kg)  01/30/22 219 lb 6.4 oz (99.5 kg)  01/23/22 220 lb 0.3 oz (99.8 kg)   He notes he is steering clear of sugared sodas  Patient Active Problem List   Diagnosis Date Noted   Hematuria 01/04/2022   Gross hematuria 01/04/2022   Secondary hypercoagulable state ([place]) 03/28/2020   Malignant neoplasm of prostate ([place]) 09/11/2018   Enlarged prostate with urinary obstruction 07/06/2018   Gastrocnemius strain, right, initial encounter 12/10/2017   Baker's cyst of knee, right 12/10/2017   Obesity 03/24/2017   Paroxysmal atrial fibrillation ([place]) 12/05/2016   Near syncope 04/20/2016   Leg hematoma, left, initial encounter 04/20/2016   Bleeding    Ecchymosis    Left hamstring muscle strain    Muscle tear    RBBB 00/93/8182   Chronic systolic dysfunction of left ventricle  07/11/2014   OSA (obstructive sleep apnea) 04/07/2013   Multinodular goiter 01/20/2013   Cervical spondylosis without myelopathy 01/18/2013   Atrial fibrillation with RVR ([place]) 10/27/2012   Left ventricular dysfunction 12/17/2011   Low back pain 09/11/2011   Fatigue 05/22/2011   PVC (premature ventricular contraction) 01/18/2011   Persistent atrial fibrillation ([place]) 01/12/2011   Hyperlipidemia 08/02/2010   Hypertension 08/02/2010   Osteoarthritis 08/02/2010   BPH (benign prostatic hyperplasia) 08/02/2010   GERD (gastroesophageal reflux disease) 08/02/2010   h/o Atrial flutter ([place]) s/p ablation 2012     Past Medical History:  Diagnosis  Date   Benign localized prostatic hyperplasia with lower urinary tract symptoms (LUTS)    urologist-- dr Diona Fanti   Cancer [facility])    prostate   Chronic anticoagulation    Xarelto for afib   Chronic constipation    Chronic cystitis    Gross hematuria    History of DVT of lower extremity 06/08/2010   right femoral dvt dx post op total hip 05-08-2010   History of gastric ulcer    remote   History of kidney stones    one stone. remote hx   Hypercholesteremia    Hyperlipidemia    Hypertension    Dx by Dr. Lance Muss around age  26    Macular degeneration    dry   NICM (nonischemic cardiomyopathy) ([place])    per echo 06-11-2017,  ef 45-50% with diffuse hypokinesis,  G1DD   OSA on CPAP 2017   compliant with CPAP.  managed by Dr Elsworth Soho.   Osteoarthritis    Other urethral stricture, male, meatal    chronic;  pt self dilates   Paroxysmal atrial fibrillation [facility]) cardiologist-- dr Rayann Heman   first dx 2009/   a. documented by Dr Barrett Shell on office EKG 9/12. b. maintained on Tikosyn and Xarelto. c. s/p DCCV 's.  d.  x3  EP w/ ablation atrial fib last one 12-05-2016   Prediabetes    Premature ventricular contraction    PVC's (premature ventricular contractions)    RA (rheumatoid arthritis) ([place])    rheumatologist--- Dr Page Spiro,  treated with Humira   RBBB (right bundle branch block)    Hx of a fib, ablation Aug 2018   Rheumatoid arthritis ([place])    Vitamin D deficiency    Wears hearing aid in both ears     Past Surgical History:  Procedure Laterality Date   ABLATION OF DYSRHYTHMIC FOCUS  08/29/2015   ATRIAL FIBRILLATION ABLATION  12/05/2016   ATRIAL FIBRILLATION ABLATION N/A 12/05/2016   Procedure: Atrial Fibrillation Ablation;  Surgeon: [name], MD;  Location: [place] [facility];  Service: Cardiovascular;  Laterality: N/A;   ATRIAL FIBRILLATION ABLATION N/A 02/29/2020   Procedure: ATRIAL FIBRILLATION ABLATION;  Surgeon: [name], MD;  Location: [place] [facility];  Service:  Cardiovascular;  Laterality: N/A;   ATRIAL FLUTTER ABLATION  09/2010   by [place] Bayley Right 1990s   CARDIOVERSION N/A 10/28/2012   Procedure: CARDIOVERSION;  Surgeon: Burnell Blanks, MD;  Location: [facility];  Service: Cardiovascular;  Laterality: N/A;   CATARACT EXTRACTION W/ INTRAOCULAR LENS  IMPLANT, BILATERAL  2019   CYSTOSCOPY WITH BIOPSY N/A 09/03/2018   Procedure: CYSTOSCOPY WITH FULGURATION [place];  Surgeon: Franchot Gallo, MD;  Location: [facility];  Service: Urology;  Laterality: N/A;  [place] N/A 01/23/2022   Procedure: CYSTOSCOPY WITH FULGERATION OF PROSTATIC URETHRA;  Surgeon: Franchot Gallo, MD;  Location: [facility];  Service: Urology;  Laterality: N/A;  [place] N/A 08/29/2015   Procedure: Atrial Fibrillation Ablation;  Surgeon: [name], MD;  Location: [place] [facility];  Service: Cardiovascular;  Laterality: N/A;   KNEE SURGERY Bilateral x3   1960s & 1970s   open   LEFT HEART CATHETERIZATION WITH CORONARY ANGIOGRAM N/A 06/07/2011   Procedure: LEFT HEART CATHETERIZATION WITH CORONARY ANGIOGRAM;  Surgeon: Peter M Martinique, MD;  Location: [facility] CATH LAB;  Service: Cardiovascular;  Laterality: N/A;    Normal coronary arteries,  low normal LVF   TONSILLECTOMY AND ADENOIDECTOMY  age 63   TOTAL HIP ARTHROPLASTY Right 06/04/10    dr Noemi Chapel  '@[facility]'$    TOTAL KNEE ARTHROPLASTY Bilateral right 1995;  left [place]  06-29-2002   dr humphries  '@[facility]'$    TRANSURETHRAL RESECTION OF PROSTATE N/A 07/06/2018   Procedure: TRANSURETHRAL RESECTION OF THE PROSTATE (TURP);  Surgeon: Franchot Gallo, MD;  Location: [facility];  Service: Urology;  Laterality: N/A;  105 MINS    Social History   Tobacco Use   Smoking status: Former    Packs/day: 1.00    Years: 30.00    Total pack years: 30.00    Types: Cigarettes    Quit date: 05/06/1978    Years since quitting:  43.8   Smokeless tobacco: Never   Tobacco comments:    Former smoker 03/14/2021  Vaping Use   Vaping Use: Never used  Substance Use Topics   Alcohol use: Yes    Alcohol/week: 1.0 standard drink of alcohol    Types: 1 Cans of beer per week    Comment: occasionally wine and beer   Drug use: Never    Family History  Problem Relation Age of Onset   Heart attack Father    Lung disease Mother    Arrhythmia Sister    Atrial fibrillation Brother    Diabetes Neg Hx     Allergies  Allergen Reactions   Penicillins Itching, Swelling and Other (See Comments)    Did it involve swelling of the face/tongue/throat, SOB, or low BP? No Did it involve sudden or severe rash/hives, skin peeling, or any reaction on the inside of your mouth or nose? No Did you need to seek medical attention at a hospital or doctor's office? No When did it last happen?      30 + years If all above answers are "NO", may proceed with cephalosporin use.      Medication list has been reviewed and updated.  Current Outpatient Medications on File Prior to Visit  Medication Sig Dispense Refill   acetaminophen (TYLENOL) 500 MG tablet Take 1,000 mg by mouth 2 (two) times daily as needed for moderate pain.     atorvastatin (LIPITOR) 10 MG tablet TAKE 1 TABLET BY MOUTH EVERY DAY 90 tablet 3   calcium carbonate (TUMS - DOSED IN MG ELEMENTAL CALCIUM) 500 MG chewable tablet Chew 1 tablet by mouth daily as needed for heartburn.     Carboxymethylcellulose Sodium (THERATEARS OP) Apply 1 drop to eye daily as needed (dry eyes).     Cholecalciferol (VITAMIN D3) 5000 UNITS TABS Take 5,000 Units by mouth daily.     Cranberry 1000 MG CAPS Take 3,000 mg by mouth at bedtime.     Docusate Calcium (STOOL SOFTENER PO) Take 4 tablets by mouth at bedtime.     finasteride (PROSCAR) 5 MG tablet Take  5 mg by mouth every morning.   3   HUMIRA PEN 40 MG/0.4ML PNKT Inject 40 mg as directed every 7 (seven) days. Every Tuesday  6   levofloxacin  (LEVAQUIN) 500 MG tablet Take 500 mg by mouth daily.     lisinopril (ZESTRIL) 40 MG tablet TAKE 1 TABLET BY MOUTH EVERY DAY 90 tablet 3   metoprolol succinate (TOPROL-XL) 50 MG 24 hr tablet Take 1 tablet (50 mg total) by mouth in the morning and at bedtime. Take with or immediately following a meal. 135 tablet 3   Multiple Vitamins-Minerals (PRESERVISION AREDS 2) CAPS Take 1 capsule by mouth 2 (two) times a day.     omeprazole (PRILOSEC) 20 MG capsule Take 20 mg by mouth daily.     rivaroxaban (XARELTO) 20 MG TABS tablet Take 20 mg by mouth daily with supper.     solifenacin (VESICARE) 10 MG tablet Take 10 mg by mouth at bedtime.      sulfamethoxazole-trimethoprim (BACTRIM DS) 800-160 MG tablet Take 1 tablet by mouth 2 (two) times daily. 6 tablet 0   tamsulosin (FLOMAX) 0.4 MG CAPS capsule Take 1 capsule (0.4 mg total) by mouth daily after supper. 30 capsule 5   Testosterone 20.25 MG/ACT (1.62%) GEL Apply 1 Pump topically at bedtime. Apply to shoulders     vitamin B-12 (CYANOCOBALAMIN) 500 MCG tablet Take 500 mcg by mouth every evening.      No current facility-administered medications on file prior to visit.    Review of Systems:  As per HPI- otherwise negative.   Physical Examination: Vitals:   03/18/22 1007  BP: 130/72  Pulse: 74  Resp: 18  Temp: 97.8 F (36.6 C)  SpO2: 97%   Vitals:   03/18/22 1007  Weight: 218 lb (98.9 kg)  Height: 6' (1.829 m)   Body mass index is 29.57 kg/m. Ideal Body Weight: Weight in (lb) to have BMI = 25: 183.9  GEN: no acute distress.  Possible mild jaundice- will check labs today  HEENT: Atraumatic, Normocephalic.  Ears and Nose: No external deformity. CV: RRR, No M/G/R. No JVD. No thrill. No extra heart sounds. Seems to be in SR right now  PULM: CTA B, no wheezes, crackles, rhonchi. No retractions. No resp. distress. No accessory muscle use. ABD: S, NT, ND, +BS. No rebound. No HSM. EXTR: No c/c/chronic bilateral LE edema is stable  PSYCH:  Normally interactive. Conversant.    Assessment and Plan: Physical exam  Physical deconditioning - Plan: Ambulatory referral to Physical Therapy  Essential hypertension - Plan: Comprehensive metabolic panel  Mixed hyperlipidemia  Paroxysmal atrial fibrillation ([place]) - Plan: TSH  Acute blood loss anemia - Plan: CBC  Physical exam today- encouraged healthy diet and exercise routine Referral back to PT so he can get back to exercise routine He dropped his hg during recent urological bleeding- check CBC He appears a bit sallow today- check bili, LFTs Overdue for a TSH- he is seeing electrophysiology this week to discuss his a fib  Signed Lamar Blinks, MD  Received labs as below, message to patient Anemia is stable-  Results for orders placed or performed in visit on 03/18/22  CBC  Result Value Ref Range   WBC 6.3 4.0 - 10.5 K/uL   RBC 3.43 (L) 4.22 - 5.81 Mil/uL   Platelets 173.0 150.0 - 400.0 K/uL   Hemoglobin 9.9 (L) 13.0 - 17.0 g/dL   HCT 32.4 (L) 39.0 - 52.0 %   MCV 94.5 78.0 - 100.0  fl   MCHC 30.5 30.0 - 36.0 g/dL   RDW 17.2 (H) 11.5 - 15.5 %  Comprehensive metabolic panel  Result Value Ref Range   Sodium 142 135 - 145 mEq/L   Potassium 3.6 3.5 - 5.1 mEq/L   Chloride 106 96 - 112 mEq/L   CO2 30 19 - 32 mEq/L   Glucose, Bld 106 (H) 70 - 99 mg/dL   BUN 12 6 - 23 mg/dL   Creatinine, Ser 0.71 0.40 - 1.50 mg/dL   Total Bilirubin 0.6 0.2 - 1.2 mg/dL   Alkaline Phosphatase 73 39 - 117 U/L   AST 16 0 - 37 U/L   ALT 14 0 - 53 U/L   Total Protein 6.2 6.0 - 8.3 g/dL   Albumin 3.7 3.5 - 5.2 g/dL   GFR 83.35 >60.00 mL/min   Calcium 8.3 (L) 8.4 - 10.5 mg/dL  TSH  Result Value Ref Range   TSH 1.30 0.35 - 5.50 uIU/mL   *Note: Due to a large number of results and/or encounters for the requested time period, some results have not been displayed. A complete set of results can be found in Results Review.

## 2022-03-18 ENCOUNTER — Encounter: Payer: Self-pay | Admitting: Family Medicine

## 2022-03-18 ENCOUNTER — Ambulatory Visit (INDEPENDENT_AMBULATORY_CARE_PROVIDER_SITE_OTHER): Payer: Medicare PPO | Admitting: Family Medicine

## 2022-03-18 VITALS — BP 130/72 | HR 74 | Temp 97.8°F | Resp 18 | Ht 72.0 in | Wt 218.0 lb

## 2022-03-18 DIAGNOSIS — D62 Acute posthemorrhagic anemia: Secondary | ICD-10-CM | POA: Diagnosis not present

## 2022-03-18 DIAGNOSIS — I10 Essential (primary) hypertension: Secondary | ICD-10-CM | POA: Diagnosis not present

## 2022-03-18 DIAGNOSIS — I480 Paroxysmal atrial fibrillation: Secondary | ICD-10-CM

## 2022-03-18 DIAGNOSIS — L601 Onycholysis: Secondary | ICD-10-CM | POA: Diagnosis not present

## 2022-03-18 DIAGNOSIS — Z0000 Encounter for general adult medical examination without abnormal findings: Secondary | ICD-10-CM

## 2022-03-18 DIAGNOSIS — E782 Mixed hyperlipidemia: Secondary | ICD-10-CM | POA: Diagnosis not present

## 2022-03-18 DIAGNOSIS — R5381 Other malaise: Secondary | ICD-10-CM | POA: Diagnosis not present

## 2022-03-18 LAB — COMPREHENSIVE METABOLIC PANEL
ALT: 14 U/L (ref 0–53)
AST: 16 U/L (ref 0–37)
Albumin: 3.7 g/dL (ref 3.5–5.2)
Alkaline Phosphatase: 73 U/L (ref 39–117)
BUN: 12 mg/dL (ref 6–23)
CO2: 30 mEq/L (ref 19–32)
Calcium: 8.3 mg/dL — ABNORMAL LOW (ref 8.4–10.5)
Chloride: 106 mEq/L (ref 96–112)
Creatinine, Ser: 0.71 mg/dL (ref 0.40–1.50)
GFR: 83.35 mL/min (ref >60.00)
Glucose, Bld: 106 mg/dL — ABNORMAL HIGH (ref 70–99)
Potassium: 3.6 mEq/L (ref 3.5–5.1)
Sodium: 142 mEq/L (ref 135–145)
Total Bilirubin: 0.6 mg/dL (ref 0.2–1.2)
Total Protein: 6.2 g/dL (ref 6.0–8.3)

## 2022-03-18 LAB — CBC
HCT: 32.4 % — ABNORMAL LOW (ref 39.0–52.0)
Hemoglobin: 9.9 g/dL — ABNORMAL LOW (ref 13.0–17.0)
MCHC: 30.5 g/dL (ref 30.0–36.0)
MCV: 94.5 fl (ref 78.0–100.0)
Platelets: 173 10*3/uL (ref 150.0–400.0)
RBC: 3.43 Mil/uL — ABNORMAL LOW (ref 4.22–5.81)
RDW: 17.2 % — ABNORMAL HIGH (ref 11.5–15.5)
WBC: 6.3 10*3/uL (ref 4.0–10.5)

## 2022-03-18 LAB — TSH: TSH: 1.3 u[IU]/mL (ref 0.35–5.50)

## 2022-03-18 NOTE — Addendum Note (Signed)
Addended by: Lamar Blinks C on: 03/18/2022 07:30 PM   Modules accepted: Orders

## 2022-03-20 ENCOUNTER — Encounter: Payer: Self-pay | Admitting: *Deleted

## 2022-03-20 ENCOUNTER — Ambulatory Visit: Payer: Medicare PPO | Attending: Cardiology | Admitting: Cardiology

## 2022-03-20 ENCOUNTER — Encounter: Payer: Self-pay | Admitting: Cardiology

## 2022-03-20 VITALS — BP 110/62 | HR 73 | Ht 72.0 in | Wt 219.2 lb

## 2022-03-20 DIAGNOSIS — I480 Paroxysmal atrial fibrillation: Secondary | ICD-10-CM

## 2022-03-20 DIAGNOSIS — Z01818 Encounter for other preprocedural examination: Secondary | ICD-10-CM

## 2022-03-20 NOTE — Patient Instructions (Signed)
Medication Instructions:  None  *If you need a refill on your cardiac medications before your next appointment, please call your pharmacy*   Lab Work: BMP If you have labs (blood work) drawn today and your tests are completely normal, you will receive your results only by: [place] (if you have MyChart) OR A paper copy in the mail If you have any lab test that is abnormal or we need to change your treatment, we will call you to review the results.   Testing/Procedures: Your physician has requested that you have an echocardiogram. Echocardiography is a painless test that uses sound waves to create images of your heart. It provides your doctor with information about the size and shape of your heart and how well your heart's chambers and valves are working. This procedure takes approximately one hour. There are no restrictions for this procedure. Please do NOT wear cologne, perfume, aftershave, or lotions (deodorant is allowed). Please arrive 15 minutes prior to your appointment time.  Your physician has requested that you have cardiac CT. Cardiac computed tomography (CT) is a painless test that uses an x-ray machine to take clear, detailed pictures of your heart. For further information please visit [email or web address]. Please follow instruction sheet as given.     Follow-Up: At [facility], you and your health needs are our priority.  As part of our continuing mission to provide you with exceptional heart care, we have created designated Provider Care Teams.  These Care Teams include your primary Cardiologist (physician) and Advanced Practice Providers (APPs -  Physician Assistants and Nurse Practitioners) who all work together to provide you with the care you need, when you need it.  We recommend signing up for the patient portal called "MyChart".  Sign up information is provided on this After Visit Summary.  MyChart is used to connect with patients for Virtual Visits  (Telemedicine).  Patients are able to view lab/test results, encounter notes, upcoming appointments, etc.  Non-urgent messages can be sent to your provider as well.   To learn more about what you can do with MyChart, go to [email or web address].    Your next appointment:   Connor [name], the Watchman Nurse Navigator, will call you after your CT once the [facility] Team has reviewed your imaging for an update on proceedings. [place]'s direct number is [phone] if you need assistance.  Other Instructions None   Important Information About Sugar

## 2022-03-20 NOTE — Progress Notes (Signed)
Electrophysiology Office Note:    Date:  03/20/2022   ID:  Connor [name], DOB [birth year], MRN [id number]  PCP:  Darreld Mclean, MD  [facility] Cardiologist:  Kirk Ruths, MD  [facility] Electrophysiologist:  Vickie Epley, MD   Referring MD: Darreld Mclean, MD   Chief Complaint: Watchman consult  History of Present Illness:    Connor [name] is a 88 y.o. male who presents for a watchman consult at the request of Connor [name]. Their medical history includes cancer, provoked DVT, PVCs, and RBBB.  Today,  He has an ablation for his flutter, which was successful. He then had 3 more ablations for his afib. The most recent one was in October of 2021.  He was bleeding in his urine recently, and his hemoglobin was very low. He had the problem areas cauterized and is doing much better. His blood count is now normal   He is on a blood thinner again, xarelto.  He doesn't exercise, but he still does his own yard work. He experiences mild fatigue.  He denies any palpitations, chest pain, shortness of breath, or peripheral edema. No lightheadedness, headaches, syncope, orthopnea, or PND.    Past Medical History:  Diagnosis Date   Benign localized prostatic hyperplasia with lower urinary tract symptoms (LUTS)    urologist-- dr Diona Fanti   Cancer [facility])    prostate   Chronic anticoagulation    Xarelto for afib   Chronic constipation    Chronic cystitis    Gross hematuria    History of DVT of lower extremity 06/08/2010   right femoral dvt dx post op total hip 05-08-2010   History of gastric ulcer    remote   History of kidney stones    one stone. remote hx   Hypercholesteremia    Hyperlipidemia    Hypertension    Dx by Dr. Lance Muss around age  76    Macular degeneration    dry   NICM (nonischemic cardiomyopathy) ([place])    per echo 06-11-2017,  ef 45-50% with diffuse hypokinesis,  G1DD   OSA on CPAP 2017   compliant with CPAP.  managed by Dr  Elsworth Soho.   Osteoarthritis    Other urethral stricture, male, meatal    chronic;  pt self dilates   Paroxysmal atrial fibrillation [facility]) cardiologist-- dr Rayann Heman   first dx 2009/   a. documented by Dr Barrett Shell on office EKG 9/12. b. maintained on Tikosyn and Xarelto. c. s/p DCCV 's.  d.  x3  EP w/ ablation atrial fib last one 12-05-2016   Prediabetes    Premature ventricular contraction    PVC's (premature ventricular contractions)    RA (rheumatoid arthritis) ([place])    rheumatologist--- Dr Page Spiro,  treated with Humira   RBBB (right bundle branch block)    Hx of a fib, ablation Aug 2018   Rheumatoid arthritis ([place])    Vitamin D deficiency    Wears hearing aid in both ears     Past Surgical History:  Procedure Laterality Date   ABLATION OF DYSRHYTHMIC FOCUS  08/29/2015   ATRIAL FIBRILLATION ABLATION  12/05/2016   ATRIAL FIBRILLATION ABLATION N/A 12/05/2016   Procedure: Atrial Fibrillation Ablation;  Surgeon: [name], MD;  Location: [place] [facility];  Service: Cardiovascular;  Laterality: N/A;   ATRIAL FIBRILLATION ABLATION N/A 02/29/2020   Procedure: ATRIAL FIBRILLATION ABLATION;  Surgeon: [name], MD;  Location: [place] [facility];  Service: Cardiovascular;  Laterality: N/A;   ATRIAL FLUTTER ABLATION  09/2010   by [place] Right 1990s   CARDIOVERSION N/A 10/28/2012   Procedure: CARDIOVERSION;  Surgeon: Burnell Blanks, MD;  Location: [place];  Service: Cardiovascular;  Laterality: N/A;   CATARACT EXTRACTION W/ INTRAOCULAR LENS  IMPLANT, BILATERAL  2019   CYSTOSCOPY WITH BIOPSY N/A 09/03/2018   Procedure: CYSTOSCOPY WITH FULGURATION [place];  Surgeon: Franchot Gallo, MD;  Location: [facility];  Service: Urology;  Laterality: N/A;  [place] N/A 01/23/2022   Procedure: CYSTOSCOPY WITH FULGERATION OF PROSTATIC URETHRA;  Surgeon: Franchot Gallo, MD;  Location: [facility];  Service: Urology;  Laterality: N/A;  [place] N/A 08/29/2015   Procedure: Atrial Fibrillation Ablation;  Surgeon: [name], MD;  Location: [place] [facility];  Service: Cardiovascular;  Laterality: N/A;   KNEE SURGERY Bilateral x3   1960s & 1970s   open   LEFT HEART CATHETERIZATION WITH CORONARY ANGIOGRAM N/A 06/07/2011   Procedure: LEFT HEART CATHETERIZATION WITH CORONARY ANGIOGRAM;  Surgeon: Peter M Martinique, MD;  Location: [facility] C[facility];  Service: Cardiovascular;  Laterality: N/A;    Normal coronary arteries,  low normal LVF   TONSILLECTOMY AND ADENOIDECTOMY  age 43   TOTAL HIP ARTHROPLASTY Right 06/04/10    dr Noemi Chapel  '@[facility]'$    TOTAL KNEE ARTHROPLASTY Bilateral right 1995;  left [place]  06-29-2002   dr humphries  '@[facility]'$    TRANSURETHRAL RESECTION OF PROSTATE N/A 07/06/2018   Procedure: TRANSURETHRAL RESECTION OF THE PROSTATE (TURP);  Surgeon: Franchot Gallo, MD;  Location: [facility];  Service: Urology;  Laterality: N/A;  45 MINS    Current Medications: Current Meds  Medication Sig   acetaminophen (TYLENOL) 500 MG tablet Take 1,000 mg by mouth 2 (two) times daily as needed for moderate pain.   atorvastatin (LIPITOR) 10 MG tablet TAKE 1 TABLET BY MOUTH EVERY DAY   calcium carbonate (TUMS - DOSED IN MG ELEMENTAL CALCIUM) 500 MG chewable tablet Chew 1 tablet by mouth daily as needed for heartburn.   Carboxymethylcellulose Sodium (THERATEARS OP) Apply 1 drop to eye daily as needed (dry eyes).   Cholecalciferol (VITAMIN D3) 5000 UNITS TABS Take 5,000 Units by mouth daily.   Cranberry 1000 MG CAPS Take 3,000 mg by mouth at bedtime.   Docusate Calcium (STOOL SOFTENER PO) Take 4 tablets by mouth at bedtime.   finasteride (PROSCAR) 5 MG tablet Take 5 mg by mouth every morning.    HUMIRA PEN 40 MG/0.4ML PNKT Inject 40 mg as directed every 7 (seven) days. Every Tuesday   levofloxacin (LEVAQUIN) 500 MG tablet Take 500 mg by mouth daily.   lisinopril  (ZESTRIL) 40 MG tablet TAKE 1 TABLET BY MOUTH EVERY DAY   metoprolol succinate (TOPROL-XL) 50 MG 24 hr tablet Take 1 tablet (50 mg total) by mouth in the morning and at bedtime. Take with or immediately following a meal.   Multiple Vitamins-Minerals (PRESERVISION AREDS 2) CAPS Take 1 capsule by mouth 2 (two) times a day.   omeprazole (PRILOSEC) 20 MG capsule Take 20 mg by mouth daily.   rivaroxaban (XARELTO) 20 MG TABS tablet Take 20 mg by mouth daily with supper.   solifenacin (VESICARE) 10 MG tablet Take 10 mg by mouth at bedtime.    sulfamethoxazole-trimethoprim (BACTRIM DS) 800-160 MG tablet Take 1 tablet by mouth 2 (two) times daily.  tamsulosin (FLOMAX) 0.4 MG CAPS capsule Take 1 capsule (0.4 mg total) by mouth daily after supper.   Testosterone 20.25 MG/ACT (1.62%) GEL Apply 1 Pump topically at bedtime. Apply to shoulders   vitamin B-12 (CYANOCOBALAMIN) 500 MCG tablet Take 500 mcg by mouth every evening.      Allergies:   Penicillins   Social History   Socioeconomic History   Marital status: Married    Spouse name: Joycelyn Schmid   Number of children: Not on file   Years of education: Not on file   Highest education level: Not on file  Occupational History   Occupation: Retired  Tobacco Use   Smoking status: Former    Packs/day: 1.00    Years: 30.00    Total pack years: 30.00    Types: Cigarettes    Quit date: 05/06/1978    Years since quitting: 43.9   Smokeless tobacco: Never   Tobacco comments:    Former smoker 03/14/2021  Vaping Use   Vaping Use: Never used  Substance and Sexual Activity   Alcohol use: Yes    Alcohol/week: 1.0 standard drink of alcohol    Types: 1 Cans of beer per week    Comment: occasionally wine and beer   Drug use: Never   Sexual activity: Not on file  Other Topics Concern   Not on file  Social History Narrative   he patient lives in [place] with his spouse.  Retired.   Social Determinants of Health   Financial Resource Strain: Low Risk   (06/21/2021)   Overall Financial Resource Strain (CARDIA)    Difficulty of Paying Living Expenses: Not hard at all  Food Insecurity: No Food Insecurity (01/08/2022)   Hunger Vital Sign    Worried About Running Out of Food in the Last Year: Never true    Ran Out of Food in the Last Year: Never true  Transportation Needs: No Transportation Needs (01/08/2022)   PRAPARE - Hydrologist (Medical): No    Lack of Transportation (Non-Medical): No  Physical Activity: Insufficiently Active (06/21/2021)   Exercise Vital Sign    Days of Exercise per Week: 2 days    Minutes of Exercise per Session: 50 min  Stress: No Stress Concern Present (06/08/2020)   [place]    Feeling of Stress : Not at all  Social Connections: Moderately Isolated (06/21/2021)   Social Connection and Isolation Panel [NHANES]    Frequency of Communication with Friends and Family: More than three times a week    Frequency of Social Gatherings with Friends and Family: More than three times a week    Attends Religious Services: Never    Marine scientist or Organizations: No    Attends Music therapist: Never    Marital Status: Married     Family History: The patient's family history includes Arrhythmia in his sister; Atrial fibrillation in his brother; Heart attack in his father; Lung disease in his mother. There is no history of Diabetes.  ROS:   Please see the history of present illness.   (+) Bleeding in urine   (+) Fatigue  All other systems reviewed and are negative.  EKGs/Labs/Other Studies Reviewed:    The following studies were reviewed today:    Recent Labs: 03/18/2022: ALT 14; BUN 12; Creatinine, Ser 0.71; Hemoglobin 9.9; Platelets 173.0; Potassium 3.6; Sodium 142; TSH 1.30   Recent Lipid Panel    Component Value  Date/Time   CHOL 156 03/12/2021 1004   TRIG 94.0 03/12/2021 1004   HDL 46.70  03/12/2021 1004   CHOLHDL 3 03/12/2021 1004   VLDL 18.8 03/12/2021 1004   LDLCALC 91 03/12/2021 1004    Physical Exam:    VS:  BP 110/62   Pulse 73   Ht 6' (1.829 m)   Wt 219 lb 3.2 oz (99.4 kg)   SpO2 94%   BMI 29.73 kg/m     Wt Readings from Last 3 Encounters:  03/20/22 219 lb 3.2 oz (99.4 kg)  03/18/22 218 lb (98.9 kg)  01/30/22 219 lb 6.4 oz (99.5 kg)     GEN: Well nourished, well developed in no acute distress HEENT: Normal NECK: No JVD; No carotid bruits LYMPHATICS: No lymphadenopathy CARDIAC: RRR, no murmurs, rubs, gallops. Device pocket well healed. RESPIRATORY:  Clear to auscultation without rales, wheezing or rhonchi  ABDOMEN: Soft, non-tender, non-distended MUSCULOSKELETAL:  No edema; No deformity  SKIN: Warm and dry NEUROLOGIC:  Alert and oriented x 3 PSYCHIATRIC:  Normal affect       ASSESSMENT:    1. Paroxysmal atrial fibrillation (HCC)   2. Pre-op evaluation    PLAN:    In order of problems listed above:  #Persistent atrial fibrillation and flutter Doing well after his most recent ablation in 2021. He has experienced severe hematuria and presents today to discuss [place]. He is thankfully tolerating xarelto now after his recent urologic procedure.  ------------------  I have seen Connor [name] in the office today who is being considered for a Watchman left atrial appendage closure device. I believe they will benefit from this procedure given their history of atrial fibrillation, CHA2DS2-VASc score of 6 and unadjusted ischemic stroke rate of 9.7% per year. Unfortunately, the patient is not felt to be a long term anticoagulation candidate secondary to severe hematuria. The patient's chart has been reviewed and I feel that they would be a candidate for short term oral anticoagulation after Watchman implant.   It is my belief that after undergoing a LAA closure procedure, Connor [name] will not need long term anticoagulation which eliminates  anticoagulation side effects and major bleeding risk.   Procedural risks for the Watchman implant have been reviewed with the patient including a 0.5% risk of stroke, <1% risk of perforation and <1% risk of device embolization. Other risks include bleeding, vascular damage, tamponade, worsening renal function, and death. The patient understands these risk and wishes to proceed.     The published clinical data on the safety and effectiveness of WATCHMAN include but are not limited to the following: - Holmes DR, Mechele Claude, Sick P et al. for the PROTECT AF Investigators. Percutaneous closure of the left atrial appendage versus warfarin therapy for prevention of stroke in patients with atrial fibrillation: a randomised non-inferiority trial. Lancet 2009; 374: 534-42. Mechele Claude, Doshi SK, Abelardo Diesel D et al. on behalf of the PROTECT AF Investigators. Percutaneous Left Atrial Appendage Closure for Stroke Prophylaxis in Patients With Atrial Fibrillation 2.3-Year Follow-up of the PROTECT AF (Watchman Left Atrial Appendage System for Embolic Protection in Patients With Atrial Fibrillation) Trial. Circulation 2013; 127:720-729. - Alli O, Doshi S,  Kar S, Reddy VY, Sievert H et al. Quality of Life Assessment in the Randomized PROTECT AF (Percutaneous Closure of the Left Atrial Appendage Versus Warfarin Therapy for Prevention of Stroke in Patients With Atrial Fibrillation) Trial of Patients at Risk for Stroke With Nonvalvular Atrial Fibrillation. J  Am Coll Cardiol 2013; 46:5681-2. Vertell Limber DR, Tarri Abernethy, Price M, [place], Sievert H, Doshi S, Huber K, Reddy V. Prospective randomized evaluation of the Watchman left atrial appendage Device in patients with atrial fibrillation versus long-term warfarin therapy; the PREVAIL trial. Journal of the [employer] of Cardiology, Vol. 4, No. 1, 2014, 1-11. - Kar S, Doshi SK, Sadhu A, Horton R, Osorio J et al. Primary outcome evaluation of a next-generation left atrial  appendage closure device: results from the PINNACLE FLX trial. Circulation 2021;143(18)1754-1762.    After today's visit with the patient which was dedicated solely for shared decision making visit regarding LAA closure device, the patient decided to proceed with the LAA appendage closure procedure scheduled to be done in the [facility] future at [facility]. Prior to the procedure, I would like to obtain a gated CT scan of the chest with contrast timed for PV/LA visualization.   HAS-BLED score 3 Hypertension Yes  Abnormal renal and liver function (Dialysis, transplant, Cr >2.26 mg/dL /Cirrhosis or Bilirubin >2x Normal or AST/ALT/AP >3x Normal) No  Stroke No  Bleeding Yes  Labile INR (Unstable/high INR) No  Elderly (>65) Yes  Drugs or alcohol (? 8 drinks/week, anti-plt or NSAID) No   CHA2DS2-VASc Score = 6  The patient's score is based upon: CHF History: 1 HTN History: 1 Diabetes History: 0 Stroke History: 2 (DVT/PE post op) Vascular Disease History: 0 Age Score: 2 Gender Score: 0    Medication Adjustments/Labs and Tests Ordered: Current medicines are reviewed at length with the patient today.  Concerns regarding medicines are outlined above.   Orders Placed This Encounter  Procedures   CT CARDIAC MORPH/PULM VEIN W/CM&W/O CA SCORE   Basic Metabolic Panel (BMET)   ECHOCARDIOGRAM COMPLETE   No orders of the defined types were placed in this encounter.   I,Mary Mosetta Pigeon Buren,acting as a scribe for Vickie Epley, MD.,have documented all relevant documentation on the behalf of Vickie Epley, MD,as directed by  Vickie Epley, MD while in the presence of Vickie Epley, MD.  I, Vickie Epley, MD, have reviewed all documentation for this visit. The documentation on 03/20/22 for the exam, diagnosis, procedures, and orders are all accurate and complete.   Signed, Hilton Cork. Quentin Ore, MD, [facility], [facility] 03/20/2022 7:15 PM    Electrophysiology [facility]

## 2022-03-21 LAB — BASIC METABOLIC PANEL
BUN/Creatinine Ratio: 16 (ref 10–24)
BUN: 13 mg/dL (ref 8–27)
CO2: 25 mmol/L (ref 20–29)
Calcium: 8.6 mg/dL (ref 8.6–10.2)
Chloride: 106 mmol/L (ref 96–106)
Creatinine, Ser: 0.83 mg/dL (ref 0.76–1.27)
Glucose: 106 mg/dL — ABNORMAL HIGH (ref 70–99)
Potassium: 3.9 mmol/L (ref 3.5–5.2)
Sodium: 146 mmol/L — ABNORMAL HIGH (ref 134–144)
eGFR: 85 mL/min/{1.73_m2} (ref >59)

## 2022-03-23 DIAGNOSIS — H353132 Nonexudative age-related macular degeneration, bilateral, intermediate dry stage: Secondary | ICD-10-CM | POA: Diagnosis not present

## 2022-03-25 ENCOUNTER — Encounter: Payer: Self-pay | Admitting: Family Medicine

## 2022-04-02 ENCOUNTER — Other Ambulatory Visit (INDEPENDENT_AMBULATORY_CARE_PROVIDER_SITE_OTHER): Payer: Medicare PPO

## 2022-04-02 DIAGNOSIS — D62 Acute posthemorrhagic anemia: Secondary | ICD-10-CM | POA: Diagnosis not present

## 2022-04-02 DIAGNOSIS — Z570 Occupational exposure to noise: Secondary | ICD-10-CM | POA: Diagnosis not present

## 2022-04-02 DIAGNOSIS — H903 Sensorineural hearing loss, bilateral: Secondary | ICD-10-CM | POA: Diagnosis not present

## 2022-04-03 ENCOUNTER — Encounter: Payer: Self-pay | Admitting: Family Medicine

## 2022-04-03 ENCOUNTER — Telehealth: Payer: Self-pay

## 2022-04-03 DIAGNOSIS — R195 Other fecal abnormalities: Secondary | ICD-10-CM

## 2022-04-03 LAB — FECAL OCCULT BLOOD, IMMUNOCHEMICAL: Fecal Occult Bld: POSITIVE — AB

## 2022-04-03 NOTE — Telephone Encounter (Signed)
Positive Ifob   Caller: [facility]   Receiver: Manuela Schwartz, RMA  Date and time:  04/03/2022 at 3:27pm

## 2022-04-04 NOTE — Telephone Encounter (Signed)
Addressed via mychart

## 2022-04-08 ENCOUNTER — Ambulatory Visit: Payer: Medicare PPO | Attending: Family Medicine | Admitting: Physical Therapy

## 2022-04-08 ENCOUNTER — Encounter: Payer: Self-pay | Admitting: Physical Therapy

## 2022-04-08 DIAGNOSIS — M542 Cervicalgia: Secondary | ICD-10-CM | POA: Diagnosis not present

## 2022-04-08 DIAGNOSIS — R252 Cramp and spasm: Secondary | ICD-10-CM | POA: Diagnosis not present

## 2022-04-08 DIAGNOSIS — R42 Dizziness and giddiness: Secondary | ICD-10-CM | POA: Insufficient documentation

## 2022-04-08 DIAGNOSIS — R262 Difficulty in walking, not elsewhere classified: Secondary | ICD-10-CM | POA: Diagnosis not present

## 2022-04-08 DIAGNOSIS — R5381 Other malaise: Secondary | ICD-10-CM | POA: Insufficient documentation

## 2022-04-08 DIAGNOSIS — M6281 Muscle weakness (generalized): Secondary | ICD-10-CM | POA: Insufficient documentation

## 2022-04-08 DIAGNOSIS — R195 Other fecal abnormalities: Secondary | ICD-10-CM | POA: Diagnosis not present

## 2022-04-08 DIAGNOSIS — R2681 Unsteadiness on feet: Secondary | ICD-10-CM | POA: Diagnosis not present

## 2022-04-08 DIAGNOSIS — R2689 Other abnormalities of gait and mobility: Secondary | ICD-10-CM | POA: Diagnosis not present

## 2022-04-08 DIAGNOSIS — I4891 Unspecified atrial fibrillation: Secondary | ICD-10-CM | POA: Diagnosis not present

## 2022-04-08 DIAGNOSIS — D649 Anemia, unspecified: Secondary | ICD-10-CM | POA: Diagnosis not present

## 2022-04-08 DIAGNOSIS — Z8711 Personal history of peptic ulcer disease: Secondary | ICD-10-CM | POA: Diagnosis not present

## 2022-04-08 NOTE — Therapy (Signed)
OUTPATIENT PHYSICAL THERAPY THORACOLUMBAR EVALUATION   Patient Name: Connor [name] MRN: [id number] DOB:07/05/1933, 88 y.o., male Today's Date: 04/08/2022  END OF SESSION:  PT End of Session - 04/08/22 0841     Visit Number 1    Number of Visits 13    Date for PT Re-Evaluation 07/03/22    Authorization Type [employer]    PT Start Time 0840    PT Stop Time 0930    PT Time Calculation (min) 50 min    Activity Tolerance Patient tolerated treatment well    Behavior During Therapy [facility] for tasks assessed/performed             Past Medical History:  Diagnosis Date   Benign localized prostatic hyperplasia with lower urinary tract symptoms (LUTS)    urologist-- dr Diona Fanti   Cancer [facility])    prostate   Chronic anticoagulation    Xarelto for afib   Chronic constipation    Chronic cystitis    Gross hematuria    History of DVT of lower extremity 06/08/2010   right femoral dvt dx post op total hip 05-08-2010   History of gastric ulcer    remote   History of kidney stones    one stone. remote hx   Hypercholesteremia    Hyperlipidemia    Hypertension    Dx by Dr. Lance Muss around age  68    Macular degeneration    dry   NICM (nonischemic cardiomyopathy) ([place])    per echo 06-11-2017,  ef 45-50% with diffuse hypokinesis,  G1DD   OSA on CPAP 2017   compliant with CPAP.  managed by Dr Elsworth Soho.   Osteoarthritis    Other urethral stricture, male, meatal    chronic;  pt self dilates   Paroxysmal atrial fibrillation [facility]) cardiologist-- dr Rayann Heman   first dx 2009/   a. documented by Dr Barrett Shell on office EKG 9/12. b. maintained on Tikosyn and Xarelto. c. s/p DCCV 's.  d.  x3  EP w/ ablation atrial fib last one 12-05-2016   Prediabetes    Premature ventricular contraction    PVC's (premature ventricular contractions)    RA (rheumatoid arthritis) ([place])    rheumatologist--- Dr Page Spiro,  treated with Humira   RBBB (right bundle branch block)    Hx of a fib, ablation Aug 2018    Rheumatoid arthritis ([place])    Vitamin D deficiency    Wears hearing aid in both ears    Past Surgical History:  Procedure Laterality Date   ABLATION OF DYSRHYTHMIC FOCUS  08/29/2015   ATRIAL FIBRILLATION ABLATION  12/05/2016   ATRIAL FIBRILLATION ABLATION N/A 12/05/2016   Procedure: Atrial Fibrillation Ablation;  Surgeon: [name], MD;  Location: [place] CV LAB;  Service: Cardiovascular;  Laterality: N/A;   ATRIAL FIBRILLATION ABLATION N/A 02/29/2020   Procedure: ATRIAL FIBRILLATION ABLATION;  Surgeon: [name], MD;  Location: [place] CV LAB;  Service: Cardiovascular;  Laterality: N/A;   ATRIAL FLUTTER ABLATION  09/2010   by Glenford Bayley Right 1990s   CARDIOVERSION N/A 10/28/2012   Procedure: CARDIOVERSION;  Surgeon: Burnell Blanks, MD;  Location: [place];  Service: Cardiovascular;  Laterality: N/A;   CATARACT EXTRACTION W/ INTRAOCULAR LENS  IMPLANT, BILATERAL  2019   CYSTOSCOPY WITH BIOPSY N/A 09/03/2018   Procedure: CYSTOSCOPY WITH FULGURATION Fran Lowes;  Surgeon: Franchot Gallo, MD;  Location: [facility];  Service: Urology;  Laterality: N/A;  [place] N/A 01/23/2022  Procedure: CYSTOSCOPY WITH FULGERATION OF PROSTATIC URETHRA;  Surgeon: Franchot Gallo, MD;  Location: [facility];  Service: Urology;  Laterality: N/A;  [place] N/A 08/29/2015   Procedure: Atrial Fibrillation Ablation;  Surgeon: [name], MD;  Location: [place] CV LAB;  Service: Cardiovascular;  Laterality: N/A;   KNEE SURGERY Bilateral x3   1960s & 1970s   open   LEFT HEART CATHETERIZATION WITH CORONARY ANGIOGRAM N/A 06/07/2011   Procedure: LEFT HEART CATHETERIZATION WITH CORONARY ANGIOGRAM;  Surgeon: Peter M Martinique, MD;  Location: [facility] CATH LAB;  Service: Cardiovascular;  Laterality: N/A;    Normal coronary arteries,  low normal LVF   TONSILLECTOMY AND ADENOIDECTOMY  age 65   TOTAL HIP ARTHROPLASTY Right 06/04/10     dr Noemi Chapel  _0    TOTAL KNEE ARTHROPLASTY Bilateral right 1995;  left [place]  06-29-2002   dr humphries  _1    TRANSURETHRAL RESECTION OF PROSTATE N/A 07/06/2018   Procedure: TRANSURETHRAL RESECTION OF THE PROSTATE (TURP);  Surgeon: Franchot Gallo, MD;  Location: [facility];  Service: Urology;  Laterality: N/A;  23 MINS   Patient Active Problem List   Diagnosis Date Noted   Hematuria 01/04/2022   Gross hematuria 01/04/2022   Secondary hypercoagulable state ([place]) 03/28/2020   Malignant neoplasm of prostate ([place]) 09/11/2018   Enlarged prostate with urinary obstruction 07/06/2018   Gastrocnemius strain, right, initial encounter 12/10/2017   Baker's cyst of knee, right 12/10/2017   Obesity 03/24/2017   Paroxysmal atrial fibrillation ([place]) 12/05/2016   Near syncope 04/20/2016   Leg hematoma, left, initial encounter 04/20/2016   Bleeding    Ecchymosis    Left hamstring muscle strain    Muscle tear    RBBB 12/45/8099   Chronic systolic dysfunction of left ventricle 07/11/2014   OSA (obstructive sleep apnea) 04/07/2013   Multinodular goiter 01/20/2013   Cervical spondylosis without myelopathy 01/18/2013   Atrial fibrillation with RVR ([place]) 10/27/2012   Left ventricular dysfunction 12/17/2011   Low back pain 09/11/2011   Fatigue 05/22/2011   PVC (premature ventricular contraction) 01/18/2011   Persistent atrial fibrillation ([place]) 01/12/2011   Hyperlipidemia 08/02/2010   Hypertension 08/02/2010   Osteoarthritis 08/02/2010   BPH (benign prostatic hyperplasia) 08/02/2010   GERD (gastroesophageal reflux disease) 08/02/2010   h/o Atrial flutter ([place]) s/p ablation 2012     PCP: Copland, MD  REFERRING PROVIDER: Copland, MD  REFERRING DIAG: Debility  Rationale for Evaluation and Treatment: Rehabilitation  THERAPY DIAG:  Muscle weakness (generalized)  Difficulty in walking, not elsewhere classified  Unsteadiness on  feet  ONSET DATE: August 2023  SUBJECTIVE:  SUBJECTIVE STATEMENT: Patient reports that he has had medical issues starting in July/August, had to go to the ED x 3 in less than a day, having bleeding and urination issues.  He is anemic.  He reports that he feels weaker and has more and more difficulty walking and completing ADL's  PERTINENT HISTORY:  Afib  PAIN: has knee and hip issues with DJD  PRECAUTIONS: None  WEIGHT BEARING RESTRICTIONS: No  FALLS:  Has patient fallen in last 6 months? No  LIVING ENVIRONMENT: Lives with: lives with their family Lives in: House/apartment Stairs: Yes: Internal: 12 steps; can reach both Has following equipment at home: None  OCCUPATION: retired  PLOF: Independent  was working out at [employer] 2x/week  PATIENT GOALS: be stronger, move better, return to the gym  NEXT MD VISIT:   OBJECTIVE:   DIAGNOSTIC FINDINGS:  none  COGNITION: Overall cognitive status: Within functional limits for tasks assessed     SENSATION: WFL  MUSCLE LENGTH: Hamstrings: Right 45 deg; Left 45 deg POSTURE: rounded shoulders and forward head  PALPATION: Non tender  LUMBAR ROM: decreased 50% due to stiffness  LOWER EXTREMITY ROM:     Active  Right eval Left eval  Hip flexion    Hip extension    Hip abduction    Hip adduction    Hip internal rotation    Hip external rotation    Knee flexion    Knee extension 10 15  Ankle dorsiflexion    Ankle plantarflexion    Ankle inversion    Ankle eversion     (Blank rows = not tested)  LOWER EXTREMITY MMT:    MMT Right eval Left eval  Hip flexion 3+ 3+  Hip extension 4- 4-  Hip abduction 3+ 3+  Hip adduction    Hip internal rotation    Hip external rotation    Knee flexion 4 4  Knee extension 3+ 3+  Ankle  dorsiflexion 4 4  Ankle plantarflexion 3+ 3+  Ankle inversion    Ankle eversion     (Blank rows = not tested)   FUNCTIONAL TESTS:  5 times sit to stand: 25 seconds Timed up and go (TUG): 22  seconds 6 minute walk test:  had to stop at 3 minutes at 540', got very SOB and was unsteady  GAIT: Distance walked: 100' Assistive device utilized: None Level of assistance: Complete Independence Comments: slow, WBOS, stairs one at a time  TODAY'S TREATMENT:                                                                                                                              DATE:  04/08/22 Nustep level 5 x 6 minutes    PATIENT EDUCATION:  Education details: POC Person educated: Patient Education method: Explanation Education comprehension: verbalized understanding  HOME EXERCISE PROGRAM: TBD  ASSESSMENT:  CLINICAL IMPRESSION: Patient is a 88 y.o. male who was seen today for physical therapy evaluation  and treatment for weakness, debility and difficulty walking.  He was a patient here earlier in the year and was doing very well and was independent with gym program, he has had health complications, house issues and really has regressed, very fatigued, weak and more difficulty walking   OBJECTIVE IMPAIRMENTS: Abnormal gait, cardiopulmonary status limiting activity, decreased activity tolerance, decreased balance, decreased endurance, decreased mobility, difficulty walking, decreased ROM, decreased strength, and impaired flexibility.   REHAB POTENTIAL: Good  CLINICAL DECISION MAKING: Stable/uncomplicated  EVALUATION COMPLEXITY: Low   GOALS: Goals reviewed with patient? Yes  SHORT TERM GOALS: Target date: 04/22/22  Independent with initial HEP Goal status: INITIAL  LONG TERM GOALS: Target date: 07/03/22  Understand posture and body mechanics Goal status: INITIAL  2.  Decrease TUG time to 13 seconds Goal status: INITIAL  3.  Decrease 5x STS to 15 seconds Goal  status: INITIAL  4.  Increase 6 minute walk test to 1200 feet Goal status: INITIAL  5.  Return safely to the gym Goal status: INITIAL  PLAN:  PT FREQUENCY: 1-2x/week  PT DURATION: 12 weeks  PLANNED INTERVENTIONS: Therapeutic exercises, Therapeutic activity, Neuromuscular re-education, Balance training, Gait training, Patient/Family education, Self Care, Joint mobilization, Stair training, and Manual therapy.  PLAN FOR NEXT SESSION: gym activities   Sumner Boast, PT 04/08/2022, 8:45 AM

## 2022-04-09 DIAGNOSIS — H903 Sensorineural hearing loss, bilateral: Secondary | ICD-10-CM | POA: Diagnosis not present

## 2022-04-10 ENCOUNTER — Ambulatory Visit (HOSPITAL_COMMUNITY): Payer: Medicare PPO | Attending: Cardiology

## 2022-04-10 DIAGNOSIS — I083 Combined rheumatic disorders of mitral, aortic and tricuspid valves: Secondary | ICD-10-CM | POA: Diagnosis not present

## 2022-04-10 DIAGNOSIS — Z01818 Encounter for other preprocedural examination: Secondary | ICD-10-CM | POA: Diagnosis not present

## 2022-04-10 DIAGNOSIS — I480 Paroxysmal atrial fibrillation: Secondary | ICD-10-CM | POA: Diagnosis not present

## 2022-04-10 DIAGNOSIS — I517 Cardiomegaly: Secondary | ICD-10-CM

## 2022-04-10 DIAGNOSIS — I77810 Thoracic aortic ectasia: Secondary | ICD-10-CM

## 2022-04-10 DIAGNOSIS — I5030 Unspecified diastolic (congestive) heart failure: Secondary | ICD-10-CM | POA: Diagnosis not present

## 2022-04-10 LAB — ECHOCARDIOGRAM COMPLETE
Area-P 1/2: 3.39 cm2
P 1/2 time: 725 msec
S' Lateral: 4.25 cm

## 2022-04-15 DIAGNOSIS — M1991 Primary osteoarthritis, unspecified site: Secondary | ICD-10-CM | POA: Diagnosis not present

## 2022-04-15 DIAGNOSIS — Z79899 Other long term (current) drug therapy: Secondary | ICD-10-CM | POA: Diagnosis not present

## 2022-04-15 DIAGNOSIS — Z6829 Body mass index (BMI) 29.0-29.9, adult: Secondary | ICD-10-CM | POA: Diagnosis not present

## 2022-04-15 DIAGNOSIS — M0600 Rheumatoid arthritis without rheumatoid factor, unspecified site: Secondary | ICD-10-CM | POA: Diagnosis not present

## 2022-04-15 DIAGNOSIS — E663 Overweight: Secondary | ICD-10-CM | POA: Diagnosis not present

## 2022-04-15 LAB — CBC AND DIFFERENTIAL: Hemoglobin: 10.2 — AB (ref 13.5–17.5)

## 2022-04-16 ENCOUNTER — Ambulatory Visit: Payer: Medicare PPO | Admitting: Physical Therapy

## 2022-04-16 ENCOUNTER — Telehealth (HOSPITAL_COMMUNITY): Payer: Self-pay | Admitting: *Deleted

## 2022-04-16 ENCOUNTER — Encounter: Payer: Self-pay | Admitting: Family Medicine

## 2022-04-16 ENCOUNTER — Encounter: Payer: Self-pay | Admitting: Physical Therapy

## 2022-04-16 DIAGNOSIS — R2689 Other abnormalities of gait and mobility: Secondary | ICD-10-CM

## 2022-04-16 DIAGNOSIS — M542 Cervicalgia: Secondary | ICD-10-CM | POA: Diagnosis not present

## 2022-04-16 DIAGNOSIS — R262 Difficulty in walking, not elsewhere classified: Secondary | ICD-10-CM | POA: Diagnosis not present

## 2022-04-16 DIAGNOSIS — R2681 Unsteadiness on feet: Secondary | ICD-10-CM | POA: Diagnosis not present

## 2022-04-16 DIAGNOSIS — R42 Dizziness and giddiness: Secondary | ICD-10-CM

## 2022-04-16 DIAGNOSIS — R252 Cramp and spasm: Secondary | ICD-10-CM | POA: Diagnosis not present

## 2022-04-16 DIAGNOSIS — M6281 Muscle weakness (generalized): Secondary | ICD-10-CM | POA: Diagnosis not present

## 2022-04-16 DIAGNOSIS — R5381 Other malaise: Secondary | ICD-10-CM | POA: Diagnosis not present

## 2022-04-16 NOTE — Therapy (Signed)
OUTPATIENT PHYSICAL THERAPY THORACOLUMBAR TREATMENT   Patient Name: Connor [name] MRN: [id number] DOB:07/07/1933, 88 y.o., male Today's Date: 04/16/2022  END OF SESSION:  PT End of Session - 04/16/22 0927     Visit Number 2    Number of Visits 13    Date for PT Re-Evaluation 07/03/22    Authorization Type [employer]    PT Start Time 0924    PT Stop Time 1013    PT Time Calculation (min) 49 min    Activity Tolerance Patient tolerated treatment well    Behavior During Therapy [facility] for tasks assessed/performed             Past Medical History:  Diagnosis Date   Benign localized prostatic hyperplasia with lower urinary tract symptoms (LUTS)    urologist-- dr Diona Fanti   Cancer [facility])    prostate   Chronic anticoagulation    Xarelto for afib   Chronic constipation    Chronic cystitis    Gross hematuria    History of DVT of lower extremity 06/08/2010   right femoral dvt dx post op total hip 05-08-2010   History of gastric ulcer    remote   History of kidney stones    one stone. remote hx   Hypercholesteremia    Hyperlipidemia    Hypertension    Dx by Dr. Lance Muss around age  55    Macular degeneration    dry   NICM (nonischemic cardiomyopathy) ([place])    per echo 06-11-2017,  ef 45-50% with diffuse hypokinesis,  G1DD   OSA on CPAP 2017   compliant with CPAP.  managed by Dr Elsworth Soho.   Osteoarthritis    Other urethral stricture, male, meatal    chronic;  pt self dilates   Paroxysmal atrial fibrillation [facility]) cardiologist-- dr Rayann Heman   first dx 2009/   a. documented by Dr Barrett Shell on office EKG 9/12. b. maintained on Tikosyn and Xarelto. c. s/p DCCV 's.  d.  x3  EP w/ ablation atrial fib last one 12-05-2016   Prediabetes    Premature ventricular contraction    PVC's (premature ventricular contractions)    RA (rheumatoid arthritis) ([place])    rheumatologist--- Dr Page Spiro,  treated with Humira   RBBB (right bundle branch block)    Hx of a fib, ablation Aug 2018    Rheumatoid arthritis ([place])    Vitamin D deficiency    Wears hearing aid in both ears    Past Surgical History:  Procedure Laterality Date   ABLATION OF DYSRHYTHMIC FOCUS  08/29/2015   ATRIAL FIBRILLATION ABLATION  12/05/2016   ATRIAL FIBRILLATION ABLATION N/A 12/05/2016   Procedure: Atrial Fibrillation Ablation;  Surgeon: [name], MD;  Location: [place] CV LAB;  Service: Cardiovascular;  Laterality: N/A;   ATRIAL FIBRILLATION ABLATION N/A 02/29/2020   Procedure: ATRIAL FIBRILLATION ABLATION;  Surgeon: [name], MD;  Location: [place] CV LAB;  Service: Cardiovascular;  Laterality: N/A;   ATRIAL FLUTTER ABLATION  09/2010   by Glenford Bayley Right 1990s   CARDIOVERSION N/A 10/28/2012   Procedure: CARDIOVERSION;  Surgeon: Burnell Blanks, MD;  Location: [place];  Service: Cardiovascular;  Laterality: N/A;   CATARACT EXTRACTION W/ INTRAOCULAR LENS  IMPLANT, BILATERAL  2019   CYSTOSCOPY WITH BIOPSY N/A 09/03/2018   Procedure: CYSTOSCOPY WITH FULGURATION Fran Lowes;  Surgeon: Franchot Gallo, MD;  [facility];  Service: Urology;  Laterality: N/A;  [place] N/A 01/23/2022  Procedure: CYSTOSCOPY WITH FULGERATION OF PROSTATIC URETHRA;  Surgeon: Franchot Gallo, MD;  [facility];  Service: Urology;  Laterality: N/A;  [place] N/A 08/29/2015   Procedure: Atrial Fibrillation Ablation;  Surgeon: [name], MD;  Location: [place] CV LAB;  Service: Cardiovascular;  Laterality: N/A;   KNEE SURGERY Bilateral x3   1960s & 1970s   open   LEFT HEART CATHETERIZATION WITH CORONARY ANGIOGRAM N/A 06/07/2011   Procedure: LEFT HEART CATHETERIZATION WITH CORONARY ANGIOGRAM;  Surgeon: Peter M Martinique, MD;  [facility] CATH LAB;  Service: Cardiovascular;  Laterality: N/A;    Normal coronary arteries,  low normal LVF   TONSILLECTOMY AND ADENOIDECTOMY  age 30   TOTAL HIP ARTHROPLASTY Right 06/04/10     dr Noemi Chapel  '@[facility]'$    TOTAL KNEE ARTHROPLASTY Bilateral right 1995;  left 1997   TRANSURETHRAL RESECTION OF PROSTATE  06-29-2002   dr humphries  '@[facility]'$    TRANSURETHRAL RESECTION OF PROSTATE N/A 07/06/2018   Procedure: TRANSURETHRAL RESECTION OF THE PROSTATE (TURP);  Surgeon: Franchot Gallo, MD;  [facility];  Service: Urology;  Laterality: N/A;  32 MINS   Patient Active Problem List   Diagnosis Date Noted   Hematuria 01/04/2022   Gross hematuria 01/04/2022   Secondary hypercoagulable state ([place]) 03/28/2020   Malignant neoplasm of prostate ([place]) 09/11/2018   Enlarged prostate with urinary obstruction 07/06/2018   Gastrocnemius strain, right, initial encounter 12/10/2017   Baker's cyst of knee, right 12/10/2017   Obesity 03/24/2017   Paroxysmal atrial fibrillation ([place]) 12/05/2016   Near syncope 04/20/2016   Leg hematoma, left, initial encounter 04/20/2016   Bleeding    Ecchymosis    Left hamstring muscle strain    Muscle tear    RBBB 96/43/8381   Chronic systolic dysfunction of left ventricle 07/11/2014   OSA (obstructive sleep apnea) 04/07/2013   Multinodular goiter 01/20/2013   Cervical spondylosis without myelopathy 01/18/2013   Atrial fibrillation with RVR ([place]) 10/27/2012   Left ventricular dysfunction 12/17/2011   Low back pain 09/11/2011   Fatigue 05/22/2011   PVC (premature ventricular contraction) 01/18/2011   Persistent atrial fibrillation ([place]) 01/12/2011   Hyperlipidemia 08/02/2010   Hypertension 08/02/2010   Osteoarthritis 08/02/2010   BPH (benign prostatic hyperplasia) 08/02/2010   GERD (gastroesophageal reflux disease) 08/02/2010   h/o Atrial flutter ([place]) s/p ablation 2012     PCP: Copland, MD  REFERRING PROVIDER: Copland, MD  REFERRING DIAG: Debility  Rationale for Evaluation and Treatment: Rehabilitation  THERAPY DIAG:  Muscle weakness (generalized)  Difficulty in walking, not elsewhere classified  Unsteadiness on  feet  Cervicalgia  Cramp and spasm  Other abnormalities of gait and mobility  Dizziness and giddiness  ONSET DATE: August 2023  SUBJECTIVE:  SUBJECTIVE STATEMENT: Patient reports that he is just weak, no falls  PERTINENT HISTORY:  Afib  PAIN: has knee and hip issues with DJD  PRECAUTIONS: None  WEIGHT BEARING RESTRICTIONS: No  FALLS:  Has patient fallen in last 6 months? No  LIVING ENVIRONMENT: Lives with: lives with their family Lives in: House/apartment Stairs: Yes: Internal: 12 steps; can reach both Has following equipment at home: None  OCCUPATION: retired  PLOF: Independent  was working out at [employer] 2x/week  PATIENT GOALS: be stronger, move better, return to the gym  NEXT MD VISIT:   OBJECTIVE:   DIAGNOSTIC FINDINGS:  none  COGNITION: Overall cognitive status: Within functional limits for tasks assessed     SENSATION: WFL  MUSCLE LENGTH: Hamstrings: Right 45 deg; Left 45 deg POSTURE: rounded shoulders and forward head  PALPATION: Non tender  LUMBAR ROM: decreased 50% due to stiffness  LOWER EXTREMITY ROM:     Active  Right eval Left eval  Hip flexion    Hip extension    Hip abduction    Hip adduction    Hip internal rotation    Hip external rotation    Knee flexion    Knee extension 10 15  Ankle dorsiflexion    Ankle plantarflexion    Ankle inversion    Ankle eversion     (Blank rows = not tested)  LOWER EXTREMITY MMT:    MMT Right eval Left eval  Hip flexion 3+ 3+  Hip extension 4- 4-  Hip abduction 3+ 3+  Hip adduction    Hip internal rotation    Hip external rotation    Knee flexion 4 4  Knee extension 3+ 3+  Ankle dorsiflexion 4 4  Ankle plantarflexion 3+ 3+  Ankle inversion    Ankle eversion     (Blank rows = not  tested)   FUNCTIONAL TESTS:  5 times sit to stand: 25 seconds Timed up and go (TUG): 22  seconds 6 minute walk test:  had to stop at 3 minutes at 540', got very SOB and was unsteady  GAIT: Distance walked: 100' Assistive device utilized: None Level of assistance: Complete Independence Comments: slow, WBOS, stairs one at a time  TODAY'S TREATMENT:                                                                                                                              DATE:  04/16/22 Nustep level 5 x 6 minutes Bike level 5 x 5 minutes UBE level 3 x 4 minutes Leg curls 35# 2x10 STS 10x from mat without UE- slightly elevated mat LAQ 3 # 2x10 Seated row 20# Lats 20# Triceps 25# Feet on ball K2C, trunk rotation, small bridges, isometric abs  04/08/22 Nustep level 5 x 6 minutes    PATIENT EDUCATION:  Education details: POC Person educated: Patient Education method: Explanation Education comprehension: verbalized understanding  HOME EXERCISE PROGRAM: TBD  ASSESSMENT:  CLINICAL IMPRESSION: Patient  did well with initiating exercises, reports that he is just fatigued and tired and has to rest at times with ADL's  OBJECTIVE IMPAIRMENTS: Abnormal gait, cardiopulmonary status limiting activity, decreased activity tolerance, decreased balance, decreased endurance, decreased mobility, difficulty walking, decreased ROM, decreased strength, and impaired flexibility.   REHAB POTENTIAL: Good  CLINICAL DECISION MAKING: Stable/uncomplicated  EVALUATION COMPLEXITY: Low   GOALS: Goals reviewed with patient? Yes  SHORT TERM GOALS: Target date: 04/22/22  Independent with initial HEP Goal status: INITIAL  LONG TERM GOALS: Target date: 07/03/22  Understand posture and body mechanics Goal status: INITIAL  2.  Decrease TUG time to 13 seconds Goal status: INITIAL  3.  Decrease 5x STS to 15 seconds Goal status: INITIAL  4.  Increase 6 minute walk test to 1200 feet Goal  status: INITIAL  5.  Return safely to the gym Goal status: INITIAL  PLAN:  PT FREQUENCY: 1-2x/week  PT DURATION: 12 weeks  PLANNED INTERVENTIONS: Therapeutic exercises, Therapeutic activity, Neuromuscular re-education, Balance training, Gait training, Patient/Family education, Self Care, Joint mobilization, Stair training, and Manual therapy.  PLAN FOR NEXT SESSION: gym activities   Sumner Boast, PT 04/16/2022, 9:28 AM

## 2022-04-16 NOTE — Telephone Encounter (Signed)
Reaching out to patient to offer assistance regarding upcoming cardiac imaging study; pt verbalizes understanding of appt date/time, parking situation and where to check in, pre-test NPO status and verified current allergies; name and call back number provided for further questions should they arise  Gordy Clement RN Navigator Cardiac [place] [facility] Vascular [phone] office [phone] cell  Patient aware to arrive at 11:30am.

## 2022-04-17 ENCOUNTER — Ambulatory Visit (HOSPITAL_COMMUNITY)
Admission: RE | Admit: 2022-04-17 | Discharge: 2022-04-17 | Disposition: A | Discharge status: Home / Self Care | Payer: Medicare PPO | Source: Ambulatory Visit | Attending: Cardiology | Admitting: Cardiology

## 2022-04-17 DIAGNOSIS — Z01818 Encounter for other preprocedural examination: Secondary | ICD-10-CM | POA: Insufficient documentation

## 2022-04-17 DIAGNOSIS — I480 Paroxysmal atrial fibrillation: Secondary | ICD-10-CM | POA: Diagnosis not present

## 2022-04-17 MED ORDER — IOHEXOL 350 MG/ML SOLN
100.0000 mL | Freq: Once | INTRAVENOUS | Status: AC | PRN
  Administered 2022-04-17: 100 mL via INTRAVENOUS

## 2022-04-18 ENCOUNTER — Other Ambulatory Visit: Payer: Medicare PPO

## 2022-04-18 NOTE — Telephone Encounter (Signed)
Abstracted

## 2022-04-22 DIAGNOSIS — H353132 Nonexudative age-related macular degeneration, bilateral, intermediate dry stage: Secondary | ICD-10-CM | POA: Diagnosis not present

## 2022-04-23 ENCOUNTER — Other Ambulatory Visit: Payer: Self-pay

## 2022-04-23 ENCOUNTER — Encounter: Payer: Self-pay | Admitting: Family Medicine

## 2022-04-23 ENCOUNTER — Encounter: Payer: Self-pay | Admitting: Physical Therapy

## 2022-04-23 ENCOUNTER — Ambulatory Visit: Payer: Medicare PPO | Admitting: Physical Therapy

## 2022-04-23 DIAGNOSIS — M542 Cervicalgia: Secondary | ICD-10-CM | POA: Diagnosis not present

## 2022-04-23 DIAGNOSIS — R262 Difficulty in walking, not elsewhere classified: Secondary | ICD-10-CM | POA: Diagnosis not present

## 2022-04-23 DIAGNOSIS — R2681 Unsteadiness on feet: Secondary | ICD-10-CM

## 2022-04-23 DIAGNOSIS — R252 Cramp and spasm: Secondary | ICD-10-CM | POA: Diagnosis not present

## 2022-04-23 DIAGNOSIS — M6281 Muscle weakness (generalized): Secondary | ICD-10-CM

## 2022-04-23 DIAGNOSIS — R5381 Other malaise: Secondary | ICD-10-CM | POA: Diagnosis not present

## 2022-04-23 DIAGNOSIS — I480 Paroxysmal atrial fibrillation: Secondary | ICD-10-CM

## 2022-04-23 DIAGNOSIS — R42 Dizziness and giddiness: Secondary | ICD-10-CM | POA: Diagnosis not present

## 2022-04-23 DIAGNOSIS — R2689 Other abnormalities of gait and mobility: Secondary | ICD-10-CM | POA: Diagnosis not present

## 2022-04-23 NOTE — Therapy (Signed)
OUTPATIENT PHYSICAL THERAPY THORACOLUMBAR TREATMENT   Patient Name: Connor [name] MRN: [id number] DOB:11-Aug-1933, 88 y.o., male Today's Date: 04/23/2022  END OF SESSION:  PT End of Session - 04/23/22 1009     Visit Number 3    Number of Visits 13    Date for PT Re-Evaluation 07/03/22    Authorization Type [employer]    PT Start Time 1009    PT Stop Time 1100    PT Time Calculation (min) 51 min    Activity Tolerance Patient tolerated treatment well    Behavior During Therapy WFL for tasks assessed/performed             Past Medical History:  Diagnosis Date   Benign localized prostatic hyperplasia with lower urinary tract symptoms (LUTS)    urologist-- dr Diona Fanti   Cancer [facility])    prostate   Chronic anticoagulation    Xarelto for afib   Chronic constipation    Chronic cystitis    Gross hematuria    History of DVT of lower extremity 06/08/2010   right femoral dvt dx post op total hip 05-08-2010   History of gastric ulcer    remote   History of kidney stones    one stone. remote hx   Hypercholesteremia    Hyperlipidemia    Hypertension    Dx by Dr. Lance Muss around age  67    Macular degeneration    dry   NICM (nonischemic cardiomyopathy) ([place])    per echo 06-11-2017,  ef 45-50% with diffuse hypokinesis,  G1DD   OSA on CPAP 2017   compliant with CPAP.  managed by Dr Elsworth Soho.   Osteoarthritis    Other urethral stricture, male, meatal    chronic;  pt self dilates   Paroxysmal atrial fibrillation [facility]) cardiologist-- dr Rayann Heman   first dx 2009/   a. documented by Dr Barrett Shell on office EKG 9/12. b. maintained on Tikosyn and Xarelto. c. s/p DCCV 's.  d.  x3  EP w/ ablation atrial fib last one 12-05-2016   Prediabetes    Premature ventricular contraction    PVC's (premature ventricular contractions)    RA (rheumatoid arthritis) ([place])    rheumatologist--- Dr Page Spiro,  treated with Humira   RBBB (right bundle branch block)    Hx of a fib, ablation Aug 2018    Rheumatoid arthritis ([place])    Vitamin D deficiency    Wears hearing aid in both ears    Past Surgical History:  Procedure Laterality Date   ABLATION OF DYSRHYTHMIC FOCUS  08/29/2015   ATRIAL FIBRILLATION ABLATION  12/05/2016   ATRIAL FIBRILLATION ABLATION N/A 12/05/2016   Procedure: Atrial Fibrillation Ablation;  Surgeon: [name], MD;  Location: [place] CV LAB;  Service: Cardiovascular;  Laterality: N/A;   ATRIAL FIBRILLATION ABLATION N/A 02/29/2020   Procedure: ATRIAL FIBRILLATION ABLATION;  Surgeon: [name], MD;  Location: [place] CV LAB;  Service: Cardiovascular;  Laterality: N/A;   ATRIAL FLUTTER ABLATION  09/2010   by Glenford Bayley Right 1990s   CARDIOVERSION N/A 10/28/2012   Procedure: CARDIOVERSION;  Surgeon: Burnell Blanks, MD;  Location: [place];  Service: Cardiovascular;  Laterality: N/A;   CATARACT EXTRACTION W/ INTRAOCULAR LENS  IMPLANT, BILATERAL  2019   CYSTOSCOPY WITH BIOPSY N/A 09/03/2018   Procedure: CYSTOSCOPY WITH FULGURATION Fran Lowes;  Surgeon: Franchot Gallo, MD;  [facility];  Service: Urology;  Laterality: N/A;  [place] N/A 01/23/2022  Procedure: CYSTOSCOPY WITH FULGERATION OF PROSTATIC URETHRA;  Surgeon: Franchot Gallo, MD;  [facility];  Service: Urology;  Laterality: N/A;  [place] N/A 08/29/2015   Procedure: Atrial Fibrillation Ablation;  Surgeon: [name], MD;  Location: [place] CV LAB;  Service: Cardiovascular;  Laterality: N/A;   KNEE SURGERY Bilateral x3   1960s & 1970s   open   LEFT HEART CATHETERIZATION WITH CORONARY ANGIOGRAM N/A 06/07/2011   Procedure: LEFT HEART CATHETERIZATION WITH CORONARY ANGIOGRAM;  Surgeon: Peter M Martinique, MD;  Location: [facility] CATH LAB;  Service: Cardiovascular;  Laterality: N/A;    Normal coronary arteries,  low normal LVF   TONSILLECTOMY AND ADENOIDECTOMY  age 68   TOTAL HIP ARTHROPLASTY Right 06/04/10     dr Noemi Chapel  _0    TOTAL KNEE ARTHROPLASTY Bilateral right 1995;  left [place]  06-29-2002   dr humphries  _1    TRANSURETHRAL RESECTION OF PROSTATE N/A 07/06/2018   Procedure: TRANSURETHRAL RESECTION OF THE PROSTATE (TURP);  Surgeon: Franchot Gallo, MD;  Location: [facility];  Service: Urology;  Laterality: N/A;  64 MINS   Patient Active Problem List   Diagnosis Date Noted   Hematuria 01/04/2022   Gross hematuria 01/04/2022   Secondary hypercoagulable state ([place]) 03/28/2020   Malignant neoplasm of prostate ([place]) 09/11/2018   Enlarged prostate with urinary obstruction 07/06/2018   Gastrocnemius strain, right, initial encounter 12/10/2017   Baker's cyst of knee, right 12/10/2017   Obesity 03/24/2017   Paroxysmal atrial fibrillation ([place]) 12/05/2016   Near syncope 04/20/2016   Leg hematoma, left, initial encounter 04/20/2016   Bleeding    Ecchymosis    Left hamstring muscle strain    Muscle tear    RBBB 35/82/5189   Chronic systolic dysfunction of left ventricle 07/11/2014   OSA (obstructive sleep apnea) 04/07/2013   Multinodular goiter 01/20/2013   Cervical spondylosis without myelopathy 01/18/2013   Atrial fibrillation with RVR ([place]) 10/27/2012   Left ventricular dysfunction 12/17/2011   Low back pain 09/11/2011   Fatigue 05/22/2011   PVC (premature ventricular contraction) 01/18/2011   Persistent atrial fibrillation () 01/12/2011   Hyperlipidemia 08/02/2010   Hypertension 08/02/2010   Osteoarthritis 08/02/2010   BPH (benign prostatic hyperplasia) 08/02/2010   GERD (gastroesophageal reflux disease) 08/02/2010   h/o Atrial flutter ([place]) s/p ablation 2012     PCP: Copland, MD  REFERRING PROVIDER: Copland, MD  REFERRING DIAG: Debility  Rationale for Evaluation and Treatment: Rehabilitation  THERAPY DIAG:  Muscle weakness (generalized)  Difficulty in walking, not elsewhere classified  Unsteadiness on  feet  Cervicalgia  Cramp and spasm  ONSET DATE: August 2023  SUBJECTIVE:  SUBJECTIVE STATEMENT: Reports that he has had a lot going on at home and was fatigued after the last visit, no pain  PERTINENT HISTORY:  Afib  PAIN: has knee and hip issues with DJD  PRECAUTIONS: None  WEIGHT BEARING RESTRICTIONS: No  FALLS:  Has patient fallen in last 6 months? No  LIVING ENVIRONMENT: Lives with: lives with their family Lives in: House/apartment Stairs: Yes: Internal: 12 steps; can reach both Has following equipment at home: None  OCCUPATION: retired  PLOF: Independent  was working out at [employer] 2x/week  PATIENT GOALS: be stronger, move better, return to the gym  NEXT MD VISIT:   OBJECTIVE:   DIAGNOSTIC FINDINGS:  none  COGNITION: Overall cognitive status: Within functional limits for tasks assessed     SENSATION: WFL  MUSCLE LENGTH: Hamstrings: Right 45 deg; Left 45 deg POSTURE: rounded shoulders and forward head  PALPATION: Non tender  LUMBAR ROM: decreased 50% due to stiffness  LOWER EXTREMITY ROM:     Active  Right eval Left eval  Hip flexion    Hip extension    Hip abduction    Hip adduction    Hip internal rotation    Hip external rotation    Knee flexion    Knee extension 10 15  Ankle dorsiflexion    Ankle plantarflexion    Ankle inversion    Ankle eversion     (Blank rows = not tested)  LOWER EXTREMITY MMT:    MMT Right eval Left eval  Hip flexion 3+ 3+  Hip extension 4- 4-  Hip abduction 3+ 3+  Hip adduction    Hip internal rotation    Hip external rotation    Knee flexion 4 4  Knee extension 3+ 3+  Ankle dorsiflexion 4 4  Ankle plantarflexion 3+ 3+  Ankle inversion    Ankle eversion     (Blank rows = not tested)   FUNCTIONAL TESTS:  5  times sit to stand: 25 seconds Timed up and go (TUG): 22  seconds 6 minute walk test:  had to stop at 3 minutes at 540', got very SOB and was unsteady  GAIT: Distance walked: 100' Assistive device utilized: None Level of assistance: Complete Independence Comments: slow, WBOS, stairs one at a time  TODAY'S TREATMENT:                                                                                                                              DATE:  04/23/22 Nustep Level 5 x 6 minutes Bike Level 4 x 6 minutes Leg curls 35# 2x10 Seated rows 20# Lats 20# 10# straight arm pulls 5# biceps 2x10 Triceps 25# 2x10 Feet on ball K2C, trunk rotation, small bridges and isometric abs Passive stretch HS  04/16/22 Nustep level 5 x 6 minutes Bike level 5 x 5 minutes UBE level 3 x 4 minutes Leg curls 35# 2x10 STS 10x from mat without UE- slightly elevated mat LAQ  3 # 2x10 Seated row 20# Lats 20# Triceps 25# Feet on ball K2C, trunk rotation, small bridges, isometric abs  04/08/22 Nustep level 5 x 6 minutes    PATIENT EDUCATION:  Education details: POC Person educated: Patient Education method: Explanation Education comprehension: verbalized understanding  HOME EXERCISE PROGRAM: TBD  ASSESSMENT:  CLINICAL IMPRESSION: Patient very tight in the HS and this was very sore and had to back off of the stretches.  We are very cautious of the knee and hip exercises as he was having issues with this earlier in the year  OBJECTIVE IMPAIRMENTS: Abnormal gait, cardiopulmonary status limiting activity, decreased activity tolerance, decreased balance, decreased endurance, decreased mobility, difficulty walking, decreased ROM, decreased strength, and impaired flexibility.   REHAB POTENTIAL: Good  CLINICAL DECISION MAKING: Stable/uncomplicated  EVALUATION COMPLEXITY: Low   GOALS: Goals reviewed with patient? Yes  SHORT TERM GOALS: Target date: 04/22/22  Independent with initial HEP Goal  status: met  LONG TERM GOALS: Target date: 07/03/22  Understand posture and body mechanics Goal status: INITIAL  2.  Decrease TUG time to 13 seconds Goal status: INITIAL  3.  Decrease 5x STS to 15 seconds Goal status: INITIAL  4.  Increase 6 minute walk test to 1200 feet Goal status: INITIAL  5.  Return safely to the gym Goal status: INITIAL  PLAN:  PT FREQUENCY: 1-2x/week  PT DURATION: 12 weeks  PLANNED INTERVENTIONS: Therapeutic exercises, Therapeutic activity, Neuromuscular re-education, Balance training, Gait training, Patient/Family education, Self Care, Joint mobilization, Stair training, and Manual therapy.  PLAN FOR NEXT SESSION: flexibility   Sumner Boast, PT 04/23/2022, 10:09 AM

## 2022-04-26 ENCOUNTER — Ambulatory Visit: Payer: Medicare PPO | Admitting: Physical Therapy

## 2022-04-26 ENCOUNTER — Encounter: Payer: Self-pay | Admitting: Physical Therapy

## 2022-04-26 DIAGNOSIS — R42 Dizziness and giddiness: Secondary | ICD-10-CM | POA: Diagnosis not present

## 2022-04-26 DIAGNOSIS — R5381 Other malaise: Secondary | ICD-10-CM | POA: Diagnosis not present

## 2022-04-26 DIAGNOSIS — R252 Cramp and spasm: Secondary | ICD-10-CM | POA: Diagnosis not present

## 2022-04-26 DIAGNOSIS — M542 Cervicalgia: Secondary | ICD-10-CM | POA: Diagnosis not present

## 2022-04-26 DIAGNOSIS — R262 Difficulty in walking, not elsewhere classified: Secondary | ICD-10-CM

## 2022-04-26 DIAGNOSIS — R2681 Unsteadiness on feet: Secondary | ICD-10-CM

## 2022-04-26 DIAGNOSIS — M6281 Muscle weakness (generalized): Secondary | ICD-10-CM | POA: Diagnosis not present

## 2022-04-26 DIAGNOSIS — R2689 Other abnormalities of gait and mobility: Secondary | ICD-10-CM | POA: Diagnosis not present

## 2022-04-26 NOTE — Therapy (Signed)
OUTPATIENT PHYSICAL THERAPY THORACOLUMBAR TREATMENT   Patient Name: Connor [name] MRN: [id number] DOB:1933/08/20, 88 y.o., male Today's Date: 04/26/2022  END OF SESSION:  PT End of Session - 04/26/22 0800     Visit Number 4    Number of Visits 13    Date for PT Re-Evaluation 07/03/22    Authorization Type [employer]    PT Start Time 0757    PT Stop Time 0845    PT Time Calculation (min) 48 min    Activity Tolerance Patient tolerated treatment well    Behavior During Therapy [facility] for tasks assessed/performed             Past Medical History:  Diagnosis Date   Benign localized prostatic hyperplasia with lower urinary tract symptoms (LUTS)    urologist-- dr Diona Fanti   Cancer [facility])    prostate   Chronic anticoagulation    Xarelto for afib   Chronic constipation    Chronic cystitis    Gross hematuria    History of DVT of lower extremity 06/08/2010   right femoral dvt dx post op total hip 05-08-2010   History of gastric ulcer    remote   History of kidney stones    one stone. remote hx   Hypercholesteremia    Hyperlipidemia    Hypertension    Dx by Dr. Lance Muss around age  76    Macular degeneration    dry   NICM (nonischemic cardiomyopathy) ([place])    per echo 06-11-2017,  ef 45-50% with diffuse hypokinesis,  G1DD   OSA on CPAP 2017   compliant with CPAP.  managed by Dr Elsworth Soho.   Osteoarthritis    Other urethral stricture, male, meatal    chronic;  pt self dilates   Paroxysmal atrial fibrillation [facility]) cardiologist-- dr Rayann Heman   first dx 2009/   a. documented by Dr Barrett Shell on office EKG 9/12. b. maintained on Tikosyn and Xarelto. c. s/p DCCV 's.  d.  x3  EP w/ ablation atrial fib last one 12-05-2016   Prediabetes    Premature ventricular contraction    PVC's (premature ventricular contractions)    RA (rheumatoid arthritis) ([place])    rheumatologist--- Dr Page Spiro,  treated with Humira   RBBB (right bundle branch block)    Hx of a fib, ablation Aug 2018    Rheumatoid arthritis ([place])    Vitamin D deficiency    Wears hearing aid in both ears    Past Surgical History:  Procedure Laterality Date   ABLATION OF DYSRHYTHMIC FOCUS  08/29/2015   ATRIAL FIBRILLATION ABLATION  12/05/2016   ATRIAL FIBRILLATION ABLATION N/A 12/05/2016   Procedure: Atrial Fibrillation Ablation;  Surgeon: [name], MD;  Location: [place] CV LAB;  Service: Cardiovascular;  Laterality: N/A;   ATRIAL FIBRILLATION ABLATION N/A 02/29/2020   Procedure: ATRIAL FIBRILLATION ABLATION;  Surgeon: [name], MD;  Location: [place] CV LAB;  Service: Cardiovascular;  Laterality: N/A;   ATRIAL FLUTTER ABLATION  09/2010   by Glenford Bayley Right 1990s   CARDIOVERSION N/A 10/28/2012   Procedure: CARDIOVERSION;  Surgeon: Burnell Blanks, MD;  Location: [place];  Service: Cardiovascular;  Laterality: N/A;   CATARACT EXTRACTION W/ INTRAOCULAR LENS  IMPLANT, BILATERAL  2019   CYSTOSCOPY WITH BIOPSY N/A 09/03/2018   Procedure: CYSTOSCOPY WITH FULGURATION Fran Lowes;  Surgeon: Franchot Gallo, MD;  [facility];  Service: Urology;  Laterality: N/A;  [place] N/A 01/23/2022  Procedure: CYSTOSCOPY WITH FULGERATION OF PROSTATIC URETHRA;  Surgeon: Franchot Gallo, MD;  [facility];  Service: Urology;  Laterality: N/A;  [place] N/A 08/29/2015   Procedure: Atrial Fibrillation Ablation;  Surgeon: [name], MD;  Location: [place] CV LAB;  Service: Cardiovascular;  Laterality: N/A;   KNEE SURGERY Bilateral x3   1960s & 1970s   open   LEFT HEART CATHETERIZATION WITH CORONARY ANGIOGRAM N/A 06/07/2011   Procedure: LEFT HEART CATHETERIZATION WITH CORONARY ANGIOGRAM;  Surgeon: Peter M Martinique, MD;  Location: [facility] CATH LAB;  Service: Cardiovascular;  Laterality: N/A;    Normal coronary arteries,  low normal LVF   TONSILLECTOMY AND ADENOIDECTOMY  age 42   TOTAL HIP ARTHROPLASTY Right 06/04/10     dr Noemi Chapel  _0    TOTAL KNEE ARTHROPLASTY Bilateral right 1995;  left [place]  06-29-2002   dr humphries  _1    TRANSURETHRAL RESECTION OF PROSTATE N/A 07/06/2018   Procedure: TRANSURETHRAL RESECTION OF THE PROSTATE (TURP);  Surgeon: Franchot Gallo, MD;  Location: [facility];  Service: Urology;  Laterality: N/A;  45 MINS   Patient Active Problem List   Diagnosis Date Noted   Hematuria 01/04/2022   Gross hematuria 01/04/2022   Secondary hypercoagulable state ([place]) 03/28/2020   Malignant neoplasm of prostate ([place]) 09/11/2018   Enlarged prostate with urinary obstruction 07/06/2018   Gastrocnemius strain, right, initial encounter 12/10/2017   Baker's cyst of knee, right 12/10/2017   Obesity 03/24/2017   Paroxysmal atrial fibrillation ([place]) 12/05/2016   Near syncope 04/20/2016   Leg hematoma, left, initial encounter 04/20/2016   Bleeding    Ecchymosis    Left hamstring muscle strain    Muscle tear    RBBB 29/93/7169   Chronic systolic dysfunction of left ventricle 07/11/2014   OSA (obstructive sleep apnea) 04/07/2013   Multinodular goiter 01/20/2013   Cervical spondylosis without myelopathy 01/18/2013   Atrial fibrillation with RVR ([place]) 10/27/2012   Left ventricular dysfunction 12/17/2011   Low back pain 09/11/2011   Fatigue 05/22/2011   PVC (premature ventricular contraction) 01/18/2011   Persistent atrial fibrillation ([place]) 01/12/2011   Hyperlipidemia 08/02/2010   Hypertension 08/02/2010   Osteoarthritis 08/02/2010   BPH (benign prostatic hyperplasia) 08/02/2010   GERD (gastroesophageal reflux disease) 08/02/2010   h/o Atrial flutter ([place]) s/p ablation 2012     PCP: Copland, MD  REFERRING PROVIDER: Copland, MD  REFERRING DIAG: Debility  Rationale for Evaluation and Treatment: Rehabilitation  THERAPY DIAG:  Muscle weakness (generalized)  Difficulty in walking, not elsewhere classified  Unsteadiness on  feet  Cervicalgia  ONSET DATE: August 2023  SUBJECTIVE:  SUBJECTIVE STATEMENT: Not sore or hurting after our sessions.  Just still feel weak and tired  PERTINENT HISTORY:  Afib  PAIN: has knee and hip issues with DJD  PRECAUTIONS: None  WEIGHT BEARING RESTRICTIONS: No  FALLS:  Has patient fallen in last 6 months? No  LIVING ENVIRONMENT: Lives with: lives with their family Lives in: House/apartment Stairs: Yes: Internal: 12 steps; can reach both Has following equipment at home: None  OCCUPATION: retired  PLOF: Independent  was working out at [employer] 2x/week  PATIENT GOALS: be stronger, move better, return to the gym  NEXT MD VISIT:   OBJECTIVE:   DIAGNOSTIC FINDINGS:  none  COGNITION: Overall cognitive status: Within functional limits for tasks assessed     SENSATION: WFL  MUSCLE LENGTH: Hamstrings: Right 45 deg; Left 45 deg POSTURE: rounded shoulders and forward head  PALPATION: Non tender  LUMBAR ROM: decreased 50% due to stiffness  LOWER EXTREMITY ROM:     Active  Right eval Left eval  Hip flexion    Hip extension    Hip abduction    Hip adduction    Hip internal rotation    Hip external rotation    Knee flexion    Knee extension 10 15  Ankle dorsiflexion    Ankle plantarflexion    Ankle inversion    Ankle eversion     (Blank rows = not tested)  LOWER EXTREMITY MMT:    MMT Right eval Left eval  Hip flexion 3+ 3+  Hip extension 4- 4-  Hip abduction 3+ 3+  Hip adduction    Hip internal rotation    Hip external rotation    Knee flexion 4 4  Knee extension 3+ 3+  Ankle dorsiflexion 4 4  Ankle plantarflexion 3+ 3+  Ankle inversion    Ankle eversion     (Blank rows = not tested)   FUNCTIONAL TESTS:  5 times sit to stand: 25 seconds Timed up  and go (TUG): 22  seconds 6 minute walk test:  had to stop at 3 minutes at 540', got very SOB and was unsteady  GAIT: Distance walked: 100' Assistive device utilized: None Level of assistance: Complete Independence Comments: slow, WBOS, stairs one at a time  TODAY'S TREATMENT:                                                                                                                              DATE:  04/26/22 Nustep Level 5x6 minutes Bike level 4 x 5 minutes Seated rows 20# 2x10_0 LAts 20# 2x10 10# straight arm pulls LEg curls 25# 2x10 Triceps 25# Biceps 5# Feet on ball K2C, trunk rotation, small bridge and isometric abs HS stretches   04/23/22 Nustep Level 5 x 6 minutes Bike Level 4 x 6 minutes Leg curls 35# 2x10 Seated rows 20# Lats 20# 10# straight arm pulls 5# biceps 2x10 Triceps 25# 2x10 Feet on ball K2C, trunk rotation, small bridges and  isometric abs Passive stretch HS  04/16/22 Nustep level 5 x 6 minutes Bike level 5 x 5 minutes UBE level 3 x 4 minutes Leg curls 35# 2x10 STS 10x from mat without UE- slightly elevated mat LAQ 3 # 2x10 Seated row 20# Lats 20# Triceps 25# Feet on ball K2C, trunk rotation, small bridges, isometric abs  04/08/22 Nustep level 5 x 6 minutes    PATIENT EDUCATION:  Education details: POC Person educated: Patient Education method: Explanation Education comprehension: verbalized understanding  HOME EXERCISE PROGRAM: TBD  ASSESSMENT:  CLINICAL IMPRESSION: Patient much improved HS flexibility today, he did seem and told me he was tired, a little more lethargic today  OBJECTIVE IMPAIRMENTS: Abnormal gait, cardiopulmonary status limiting activity, decreased activity tolerance, decreased balance, decreased endurance, decreased mobility, difficulty walking, decreased ROM, decreased strength, and impaired flexibility.   REHAB POTENTIAL: Good  CLINICAL DECISION MAKING: Stable/uncomplicated  EVALUATION COMPLEXITY:  Low   GOALS: Goals reviewed with patient? Yes  SHORT TERM GOALS: Target date: 04/22/22  Independent with initial HEP Goal status: met  LONG TERM GOALS: Target date: 07/03/22  Understand posture and body mechanics Goal status: INITIAL  2.  Decrease TUG time to 13 seconds Goal status: INITIAL  3.  Decrease 5x STS to 15 seconds Goal status: INITIAL  4.  Increase 6 minute walk test to 1200 feet Goal status: INITIAL  5.  Return safely to the gym Goal status: INITIAL  PLAN:  PT FREQUENCY: 1-2x/week  PT DURATION: 12 weeks  PLANNED INTERVENTIONS: Therapeutic exercises, Therapeutic activity, Neuromuscular re-education, Balance training, Gait training, Patient/Family education, Self Care, Joint mobilization, Stair training, and Manual therapy.  PLAN FOR NEXT SESSION: flexibility strength and endurance   Audrena Talaga W, PT 04/26/2022, 8:01 AM

## 2022-05-08 ENCOUNTER — Encounter: Payer: Self-pay | Admitting: Physical Therapy

## 2022-05-08 ENCOUNTER — Ambulatory Visit: Payer: Medicare PPO | Attending: Family Medicine | Admitting: Physical Therapy

## 2022-05-08 DIAGNOSIS — R252 Cramp and spasm: Secondary | ICD-10-CM | POA: Insufficient documentation

## 2022-05-08 DIAGNOSIS — R2681 Unsteadiness on feet: Secondary | ICD-10-CM | POA: Diagnosis not present

## 2022-05-08 DIAGNOSIS — M542 Cervicalgia: Secondary | ICD-10-CM | POA: Insufficient documentation

## 2022-05-08 DIAGNOSIS — R262 Difficulty in walking, not elsewhere classified: Secondary | ICD-10-CM

## 2022-05-08 DIAGNOSIS — M6281 Muscle weakness (generalized): Secondary | ICD-10-CM

## 2022-05-08 NOTE — Therapy (Signed)
OUTPATIENT PHYSICAL THERAPY THORACOLUMBAR TREATMENT   Patient Name: Connor [name] MRN: [id number] DOB:1934/02/09, 89 y.o., male Today's Date: 05/08/2022  END OF SESSION:  PT End of Session - 05/08/22 0935     Visit Number 5    Number of Visits 13    Date for PT Re-Evaluation 07/03/22    Authorization Type [employer]    PT Start Time 0932    PT Stop Time 8295    PT Time Calculation (min) 43 min    Activity Tolerance Patient tolerated treatment well    Behavior During Therapy [facility] for tasks assessed/performed             Past Medical History:  Diagnosis Date   Benign localized prostatic hyperplasia with lower urinary tract symptoms (LUTS)    urologist-- dr Diona Fanti   Cancer [facility])    prostate   Chronic anticoagulation    Xarelto for afib   Chronic constipation    Chronic cystitis    Gross hematuria    History of DVT of lower extremity 06/08/2010   right femoral dvt dx post op total hip 05-08-2010   History of gastric ulcer    remote   History of kidney stones    one stone. remote hx   Hypercholesteremia    Hyperlipidemia    Hypertension    Dx by Dr. Lance Muss around age  60    Macular degeneration    dry   NICM (nonischemic cardiomyopathy) ([place])    per echo 06-11-2017,  ef 45-50% with diffuse hypokinesis,  G1DD   OSA on CPAP 2017   compliant with CPAP.  managed by Dr Elsworth Soho.   Osteoarthritis    Other urethral stricture, male, meatal    chronic;  pt self dilates   Paroxysmal atrial fibrillation [facility]) cardiologist-- dr Rayann Heman   first dx 2009/   a. documented by Dr Barrett Shell on office EKG 9/12. b. maintained on Tikosyn and Xarelto. c. s/p DCCV 's.  d.  x3  EP w/ ablation atrial fib last one 12-05-2016   Prediabetes    Premature ventricular contraction    PVC's (premature ventricular contractions)    RA (rheumatoid arthritis) ([place])    rheumatologist--- Dr Page Spiro,  treated with Humira   RBBB (right bundle branch block)    Hx of a fib, ablation Aug 2018    Rheumatoid arthritis ([place])    Vitamin D deficiency    Wears hearing aid in both ears    Past Surgical History:  Procedure Laterality Date   ABLATION OF DYSRHYTHMIC FOCUS  08/29/2015   ATRIAL FIBRILLATION ABLATION  12/05/2016   ATRIAL FIBRILLATION ABLATION N/A 12/05/2016   Procedure: Atrial Fibrillation Ablation;  Surgeon: [name], MD;  Location: [place] CV LAB;  Service: Cardiovascular;  Laterality: N/A;   ATRIAL FIBRILLATION ABLATION N/A 02/29/2020   Procedure: ATRIAL FIBRILLATION ABLATION;  Surgeon: [name], MD;  Location: [place] CV [facility];  Service: Cardiovascular;  Laterality: N/A;   ATRIAL FLUTTER ABLATION  09/2010   by Glenford Bayley Right 1990s   CARDIOVERSION N/A 10/28/2012   Procedure: CARDIOVERSION;  Surgeon: Burnell Blanks, MD;  Location: [place];  Service: Cardiovascular;  Laterality: N/A;   CATARACT EXTRACTION W/ INTRAOCULAR LENS  IMPLANT, BILATERAL  2019   CYSTOSCOPY WITH BIOPSY N/A 09/03/2018   Procedure: CYSTOSCOPY WITH FULGURATION Fran Lowes;  Surgeon: Franchot Gallo, MD;  [facility];  Service: Urology;  Laterality: N/A;  [place] N/A 01/23/2022  Procedure: CYSTOSCOPY WITH FULGERATION OF PROSTATIC URETHRA;  Surgeon: Franchot Gallo, MD;  Location: [facility];  Service: Urology;  Laterality: N/A;  [place] N/A 08/29/2015   Procedure: Atrial Fibrillation Ablation;  Surgeon: [name], MD;  Location: [place] CV LAB;  Service: Cardiovascular;  Laterality: N/A;   KNEE SURGERY Bilateral x3   1960s & 1970s   open   LEFT HEART CATHETERIZATION WITH CORONARY ANGIOGRAM N/A 06/07/2011   Procedure: LEFT HEART CATHETERIZATION WITH CORONARY ANGIOGRAM;  Surgeon: Peter M Martinique, MD;  Location: [facility] CATH LAB;  Service: Cardiovascular;  Laterality: N/A;    Normal coronary arteries,  low normal LVF   TONSILLECTOMY AND ADENOIDECTOMY  age 65   TOTAL HIP ARTHROPLASTY Right 06/04/10     dr Noemi Chapel  _0    TOTAL KNEE ARTHROPLASTY Bilateral right 1995;  left [place]  06-29-2002   dr humphries  _1    TRANSURETHRAL RESECTION OF PROSTATE N/A 07/06/2018   Procedure: TRANSURETHRAL RESECTION OF THE PROSTATE (TURP);  Surgeon: Franchot Gallo, MD;  Location: [facility];  Service: Urology;  Laterality: N/A;  23 MINS   Patient Active Problem List   Diagnosis Date Noted   Hematuria 01/04/2022   Gross hematuria 01/04/2022   Secondary hypercoagulable state ([place]) 03/28/2020   Malignant neoplasm of prostate ([place]) 09/11/2018   Enlarged prostate with urinary obstruction 07/06/2018   Gastrocnemius strain, right, initial encounter 12/10/2017   Baker's cyst of knee, right 12/10/2017   Obesity 03/24/2017   Paroxysmal atrial fibrillation ([place]) 12/05/2016   Near syncope 04/20/2016   Leg hematoma, left, initial encounter 04/20/2016   Bleeding    Ecchymosis    Left hamstring muscle strain    Muscle tear    RBBB 12/45/8099   Chronic systolic dysfunction of left ventricle 07/11/2014   OSA (obstructive sleep apnea) 04/07/2013   Multinodular goiter 01/20/2013   Cervical spondylosis without myelopathy 01/18/2013   Atrial fibrillation with RVR ([place]) 10/27/2012   Left ventricular dysfunction 12/17/2011   Low back pain 09/11/2011   Fatigue 05/22/2011   PVC (premature ventricular contraction) 01/18/2011   Persistent atrial fibrillation ([place]) 01/12/2011   Hyperlipidemia 08/02/2010   Hypertension 08/02/2010   Osteoarthritis 08/02/2010   BPH (benign prostatic hyperplasia) 08/02/2010   GERD (gastroesophageal reflux disease) 08/02/2010   h/o Atrial flutter ([place]) s/p ablation 2012     PCP: Copland, MD  REFERRING PROVIDER: Copland, MD  REFERRING DIAG: Debility  Rationale for Evaluation and Treatment: Rehabilitation  THERAPY DIAG:  Muscle weakness (generalized)  Difficulty in walking, not elsewhere classified  Unsteadiness on  feet  ONSET DATE: August 2023  SUBJECTIVE:  SUBJECTIVE STATEMENT: REports really needs to work on endurance, gets tired too easy and can't do as much as he needs or wants  PERTINENT HISTORY:  Afib  PAIN: has knee and hip issues with DJD  PRECAUTIONS: None  WEIGHT BEARING RESTRICTIONS: No  FALLS:  Has patient fallen in last 6 months? No  LIVING ENVIRONMENT: Lives with: lives with their family Lives in: House/apartment Stairs: Yes: Internal: 12 steps; can reach both Has following equipment at home: None  OCCUPATION: retired  PLOF: Independent  was working out at [employer] 2x/week  PATIENT GOALS: be stronger, move better, return to the gym  NEXT MD VISIT:   OBJECTIVE:   DIAGNOSTIC FINDINGS:  none  COGNITION: Overall cognitive status: Within functional limits for tasks assessed     SENSATION: WFL  MUSCLE LENGTH: Hamstrings: Right 45 deg; Left 45 deg POSTURE: rounded shoulders and forward head  PALPATION: Non tender  LUMBAR ROM: decreased 50% due to stiffness  LOWER EXTREMITY ROM:     Active  Right eval Left eval  Hip flexion    Hip extension    Hip abduction    Hip adduction    Hip internal rotation    Hip external rotation    Knee flexion    Knee extension 10 15  Ankle dorsiflexion    Ankle plantarflexion    Ankle inversion    Ankle eversion     (Blank rows = not tested)  LOWER EXTREMITY MMT:    MMT Right eval Left eval  Hip flexion 3+ 3+  Hip extension 4- 4-  Hip abduction 3+ 3+  Hip adduction    Hip internal rotation    Hip external rotation    Knee flexion 4 4  Knee extension 3+ 3+  Ankle dorsiflexion 4 4  Ankle plantarflexion 3+ 3+  Ankle inversion    Ankle eversion     (Blank rows = not tested)   FUNCTIONAL TESTS:  5 times sit to stand: 25  seconds Timed up and go (TUG): 22  seconds 6 minute walk test:  had to stop at 3 minutes at 540', got very SOB and was unsteady  GAIT: Distance walked: 100' Assistive device utilized: None Level of assistance: Complete Independence Comments: slow, WBOS, stairs one at a time  TODAY'S TREATMENT:                                                                                                                              DATE:  05/08/22 Bike level 4 x 6 minutes Nustep level 6 x 6 minutes Seated row 25# 3x10 Lats 25# 3x10 Chest press 10# 3x10 Triceps 35# 2x10 Biceps 10# 2x10 Tmill push fwd backward 15 seconds x3 each Feet on ball K2C, trunk rotation, small bridges and isometric abs Passive HS and piriformis stretch  04/26/22 Nustep Level 5x6 minutes Bike level 4 x 5 minutes Seated rows 20# 2x10\ LAts 20# 2x10 10# straight arm pulls LEg curls  25# 2x10 Triceps 25# Biceps 5# Feet on ball K2C, trunk rotation, small bridge and isometric abs HS stretches   04/23/22 Nustep Level 5 x 6 minutes Bike Level 4 x 6 minutes Leg curls 35# 2x10 Seated rows 20# Lats 20# 10# straight arm pulls 5# biceps 2x10 Triceps 25# 2x10 Feet on ball K2C, trunk rotation, small bridges and isometric abs Passive stretch HS  04/16/22 Nustep level 5 x 6 minutes Bike level 5 x 5 minutes UBE level 3 x 4 minutes Leg curls 35# 2x10 STS 10x from mat without UE- slightly elevated mat LAQ 3 # 2x10 Seated row 20# Lats 20# Triceps 25# Feet on ball K2C, trunk rotation, small bridges, isometric abs  04/08/22 Nustep level 5 x 6 minutes    PATIENT EDUCATION:  Education details: POC Person educated: Patient Education method: Explanation Education comprehension: verbalized understanding  HOME EXERCISE PROGRAM: TBD  ASSESSMENT:  CLINICAL IMPRESSION: Patient reports that over the holiday he was not able to do much, reports feels very weak, he will be having a heart surgery in March and wants to be  ready for that recovery  OBJECTIVE IMPAIRMENTS: Abnormal gait, cardiopulmonary status limiting activity, decreased activity tolerance, decreased balance, decreased endurance, decreased mobility, difficulty walking, decreased ROM, decreased strength, and impaired flexibility.   REHAB POTENTIAL: Good  CLINICAL DECISION MAKING: Stable/uncomplicated  EVALUATION COMPLEXITY: Low   GOALS: Goals reviewed with patient? Yes  SHORT TERM GOALS: Target date: 04/22/22  Independent with initial HEP Goal status: met  LONG TERM GOALS: Target date: 07/03/22  Understand posture and body mechanics Goal status: ongoing  2.  Decrease TUG time to 13 seconds Goal status: INITIAL  3.  Decrease 5x STS to 15 seconds Goal status: INITIAL  4.  Increase 6 minute walk test to 1200 feet Goal status: ongoing  5.  Return safely to the gym Goal status: ongoing  PLAN:  PT FREQUENCY: 1-2x/week  PT DURATION: 12 weeks  PLANNED INTERVENTIONS: Therapeutic exercises, Therapeutic activity, Neuromuscular re-education, Balance training, Gait training, Patient/Family education, Self Care, Joint mobilization, Stair training, and Manual therapy.  PLAN FOR NEXT SESSION: flexibility strength and endurance   Orren Pietsch W, PT 05/08/2022, 9:36 AM

## 2022-05-10 ENCOUNTER — Ambulatory Visit: Payer: Medicare PPO | Admitting: Physical Therapy

## 2022-05-10 ENCOUNTER — Encounter: Payer: Self-pay | Admitting: Physical Therapy

## 2022-05-10 DIAGNOSIS — M6281 Muscle weakness (generalized): Secondary | ICD-10-CM

## 2022-05-10 DIAGNOSIS — M542 Cervicalgia: Secondary | ICD-10-CM

## 2022-05-10 DIAGNOSIS — R252 Cramp and spasm: Secondary | ICD-10-CM

## 2022-05-10 DIAGNOSIS — R2681 Unsteadiness on feet: Secondary | ICD-10-CM | POA: Diagnosis not present

## 2022-05-10 DIAGNOSIS — R262 Difficulty in walking, not elsewhere classified: Secondary | ICD-10-CM

## 2022-05-10 NOTE — Therapy (Signed)
OUTPATIENT PHYSICAL THERAPY THORACOLUMBAR TREATMENT   Patient Name: Connor [name] MRN: [id number] DOB:11-Nov-1933, 89 y.o., male Today's Date: 05/10/2022  END OF SESSION:  PT End of Session - 05/10/22 0930     Visit Number 6    Number of Visits 13    Date for PT Re-Evaluation 07/03/22    Authorization Type [employer]    PT Start Time 0930    PT Stop Time 1030    PT Time Calculation (min) 60 min    Activity Tolerance Patient tolerated treatment well    Behavior During Therapy WFL for tasks assessed/performed             Past Medical History:  Diagnosis Date   Benign localized prostatic hyperplasia with lower urinary tract symptoms (LUTS)    urologist-- dr Diona Fanti   Cancer [facility])    prostate   Chronic anticoagulation    Xarelto for afib   Chronic constipation    Chronic cystitis    Gross hematuria    History of DVT of lower extremity 06/08/2010   right femoral dvt dx post op total hip 05-08-2010   History of gastric ulcer    remote   History of kidney stones    one stone. remote hx   Hypercholesteremia    Hyperlipidemia    Hypertension    Dx by Dr. Lance Muss around age  68    Macular degeneration    dry   NICM (nonischemic cardiomyopathy) ([place])    per echo 06-11-2017,  ef 45-50% with diffuse hypokinesis,  G1DD   OSA on CPAP 2017   compliant with CPAP.  managed by Dr Elsworth Soho.   Osteoarthritis    Other urethral stricture, male, meatal    chronic;  pt self dilates   Paroxysmal atrial fibrillation [facility]) cardiologist-- dr Rayann Heman   first dx 2009/   a. documented by Dr Barrett Shell on office EKG 9/12. b. maintained on Tikosyn and Xarelto. c. s/p DCCV 's.  d.  x3  EP w/ ablation atrial fib last one 12-05-2016   Prediabetes    Premature ventricular contraction    PVC's (premature ventricular contractions)    RA (rheumatoid arthritis) ([place])    rheumatologist--- Dr Page Spiro,  treated with Humira   RBBB (right bundle branch block)    Hx of a fib, ablation Aug 2018    Rheumatoid arthritis ([place])    Vitamin D deficiency    Wears hearing aid in both ears    Past Surgical History:  Procedure Laterality Date   ABLATION OF DYSRHYTHMIC FOCUS  08/29/2015   ATRIAL FIBRILLATION ABLATION  12/05/2016   ATRIAL FIBRILLATION ABLATION N/A 12/05/2016   Procedure: Atrial Fibrillation Ablation;  Surgeon: [name], MD;  Location: [place] CV LAB;  Service: Cardiovascular;  Laterality: N/A;   ATRIAL FIBRILLATION ABLATION N/A 02/29/2020   Procedure: ATRIAL FIBRILLATION ABLATION;  Surgeon: [name], MD;  Location: [place] [facility];  Service: Cardiovascular;  Laterality: N/A;   ATRIAL FLUTTER ABLATION  09/2010   by Glenford Bayley Right 1990s   CARDIOVERSION N/A 10/28/2012   Procedure: CARDIOVERSION;  Surgeon: Burnell Blanks, MD;  Location: [place];  Service: Cardiovascular;  Laterality: N/A;   CATARACT EXTRACTION W/ INTRAOCULAR LENS  IMPLANT, BILATERAL  2019   CYSTOSCOPY WITH BIOPSY N/A 09/03/2018   Procedure: CYSTOSCOPY WITH FULGURATION Fran Lowes;  Surgeon: Franchot Gallo, MD;  [facility];  Service: Urology;  Laterality: N/A;  [place] N/A 01/23/2022  Procedure: CYSTOSCOPY WITH FULGERATION OF PROSTATIC URETHRA;  Surgeon: Franchot Gallo, MD;  [facility];  Service: Urology;  Laterality: N/A;  [place] N/A 08/29/2015   Procedure: Atrial Fibrillation Ablation;  Surgeon: [name], MD;  Location: [place] [facility];  Service: Cardiovascular;  Laterality: N/A;   KNEE SURGERY Bilateral x3   1960s & 1970s   open   LEFT HEART CATHETERIZATION WITH CORONARY ANGIOGRAM N/A 06/07/2011   Procedure: LEFT HEART CATHETERIZATION WITH CORONARY ANGIOGRAM;  Surgeon: Peter M Martinique, MD;  Location: [facility] CATH LAB;  Service: Cardiovascular;  Laterality: N/A;    Normal coronary arteries,  low normal LVF   TONSILLECTOMY AND ADENOIDECTOMY  age 68   TOTAL HIP ARTHROPLASTY Right 06/04/10     dr Noemi Chapel  _0    TOTAL KNEE ARTHROPLASTY Bilateral right 1995;  left [place]  06-29-2002   dr humphries  _1    TRANSURETHRAL RESECTION OF PROSTATE N/A 07/06/2018   Procedure: TRANSURETHRAL RESECTION OF THE PROSTATE (TURP);  Surgeon: Franchot Gallo, MD;  Location: [facility];  Service: Urology;  Laterality: N/A;  64 MINS   Patient Active Problem List   Diagnosis Date Noted   Hematuria 01/04/2022   Gross hematuria 01/04/2022   Secondary hypercoagulable state ([place]) 03/28/2020   Malignant neoplasm of prostate ([place]) 09/11/2018   Enlarged prostate with urinary obstruction 07/06/2018   Gastrocnemius strain, right, initial encounter 12/10/2017   Baker's cyst of knee, right 12/10/2017   Obesity 03/24/2017   Paroxysmal atrial fibrillation ([place]) 12/05/2016   Near syncope 04/20/2016   Leg hematoma, left, initial encounter 04/20/2016   Bleeding    Ecchymosis    Left hamstring muscle strain    Muscle tear    RBBB 35/82/5189   Chronic systolic dysfunction of left ventricle 07/11/2014   OSA (obstructive sleep apnea) 04/07/2013   Multinodular goiter 01/20/2013   Cervical spondylosis without myelopathy 01/18/2013   Atrial fibrillation with RVR ([place]) 10/27/2012   Left ventricular dysfunction 12/17/2011   Low back pain 09/11/2011   Fatigue 05/22/2011   PVC (premature ventricular contraction) 01/18/2011   Persistent atrial fibrillation () 01/12/2011   Hyperlipidemia 08/02/2010   Hypertension 08/02/2010   Osteoarthritis 08/02/2010   BPH (benign prostatic hyperplasia) 08/02/2010   GERD (gastroesophageal reflux disease) 08/02/2010   h/o Atrial flutter ([place]) s/p ablation 2012     PCP: Copland, MD  REFERRING PROVIDER: Copland, MD  REFERRING DIAG: Debility  Rationale for Evaluation and Treatment: Rehabilitation  THERAPY DIAG:  Muscle weakness (generalized)  Difficulty in walking, not elsewhere classified  Unsteadiness on  feet  Cervicalgia  Cramp and spasm  ONSET DATE: August 2023  SUBJECTIVE:  SUBJECTIVE STATEMENT: Patient reports a little tired, no pain PERTINENT HISTORY:  Afib  PAIN: has knee and hip issues with DJD  PRECAUTIONS: None  WEIGHT BEARING RESTRICTIONS: No  FALLS:  Has patient fallen in last 6 months? No  LIVING ENVIRONMENT: Lives with: lives with their family Lives in: House/apartment Stairs: Yes: Internal: 12 steps; can reach both Has following equipment at home: None  OCCUPATION: retired  PLOF: Independent  was working out at [employer] 2x/week  PATIENT GOALS: be stronger, move better, return to the gym  NEXT MD VISIT:   OBJECTIVE:   DIAGNOSTIC FINDINGS:  none  COGNITION: Overall cognitive status: Within functional limits for tasks assessed     SENSATION: WFL  MUSCLE LENGTH: Hamstrings: Right 45 deg; Left 45 deg POSTURE: rounded shoulders and forward head  PALPATION: Non tender  LUMBAR ROM: decreased 50% due to stiffness  LOWER EXTREMITY ROM:     Active  Right eval Left eval  Hip flexion    Hip extension    Hip abduction    Hip adduction    Hip internal rotation    Hip external rotation    Knee flexion    Knee extension 10 15  Ankle dorsiflexion    Ankle plantarflexion    Ankle inversion    Ankle eversion     (Blank rows = not tested)  LOWER EXTREMITY MMT:    MMT Right eval Left eval  Hip flexion 3+ 3+  Hip extension 4- 4-  Hip abduction 3+ 3+  Hip adduction    Hip internal rotation    Hip external rotation    Knee flexion 4 4  Knee extension 3+ 3+  Ankle dorsiflexion 4 4  Ankle plantarflexion 3+ 3+  Ankle inversion    Ankle eversion     (Blank rows = not tested)   FUNCTIONAL TESTS:  5 times sit to stand: 25 seconds Timed up and go (TUG): 22   seconds 6 minute walk test:  had to stop at 3 minutes at 540', got very SOB and was unsteady  GAIT: Distance walked: 100' Assistive device utilized: None Level of assistance: Complete Independence Comments: slow, WBOS, stairs one at a time  TODAY'S TREATMENT:                                                                                                                              DATE:  05/10/22 UBE level 3 x 5 minutes Bike level 4 x 5 minutes Nustep level 5 x 6 minutes Chest press 10# 3x10 Seated rows 35# 3x10 Lats 35# 3x10 Triceps 35# this was a struggle for him, decreased reps Biceps 10# 2x10 LEg curls 35# Leg extension 10# Passive HS and piriformis stretch  05/08/22 Bike level 4 x 6 minutes Nustep level 6 x 6 minutes Seated row 25# 3x10 Lats 25# 3x10 Chest press 10# 3x10 Triceps 35# 2x10 Biceps 10# 2x10 Tmill push fwd backward 15 seconds x3 each Feet  on ball K2C, trunk rotation, small bridges and isometric abs Passive HS and piriformis stretch  04/26/22 Nustep Level 5x6 minutes Bike level 4 x 5 minutes Seated rows 20# 2x10\ LAts 20# 2x10 10# straight arm pulls LEg curls 25# 2x10 Triceps 25# Biceps 5# Feet on ball K2C, trunk rotation, small bridge and isometric abs HS stretches   04/23/22 Nustep Level 5 x 6 minutes Bike Level 4 x 6 minutes Leg curls 35# 2x10 Seated rows 20# Lats 20# 10# straight arm pulls 5# biceps 2x10 Triceps 25# 2x10 Feet on ball K2C, trunk rotation, small bridges and isometric abs Passive stretch HS  04/16/22 Nustep level 5 x 6 minutes Bike level 5 x 5 minutes UBE level 3 x 4 minutes Leg curls 35# 2x10 STS 10x from mat without UE- slightly elevated mat LAQ 3 # 2x10 Seated row 20# Lats 20# Triceps 25# Feet on ball K2C, trunk rotation, small bridges, isometric abs  04/08/22 Nustep level 5 x 6 minutes    PATIENT EDUCATION:  Education details: POC Person educated: Patient Education method: Explanation Education  comprehension: verbalized understanding  HOME EXERCISE PROGRAM: TBD  ASSESSMENT:  CLINICAL IMPRESSION: Patient very fatigued and reports spaghetti legs after the treatment, had no increase of pain but again the HS and the piriformis mms were very tight OBJECTIVE IMPAIRMENTS: Abnormal gait, cardiopulmonary status limiting activity, decreased activity tolerance, decreased balance, decreased endurance, decreased mobility, difficulty walking, decreased ROM, decreased strength, and impaired flexibility.   REHAB POTENTIAL: Good  CLINICAL DECISION MAKING: Stable/uncomplicated  EVALUATION COMPLEXITY: Low   GOALS: Goals reviewed with patient? Yes  SHORT TERM GOALS: Target date: 04/22/22  Independent with initial HEP Goal status: met  LONG TERM GOALS: Target date: 07/03/22  Understand posture and body mechanics Goal status: ongoing  2.  Decrease TUG time to 13 seconds Goal status:ongoing  3.  Decrease 5x STS to 15 seconds Goal status: INITIAL  4.  Increase 6 minute walk test to 1200 feet Goal status: ongoing  5.  Return safely to the gym Goal status: ongoing  PLAN:  PT FREQUENCY: 1-2x/week  PT DURATION: 12 weeks  PLANNED INTERVENTIONS: Therapeutic exercises, Therapeutic activity, Neuromuscular re-education, Balance training, Gait training, Patient/Family education, Self Care, Joint mobilization, Stair training, and Manual therapy.  PLAN FOR NEXT SESSION: flexibility strength and endurance   Chiquitta Matty W, PT 05/10/2022, 9:30 AM

## 2022-05-13 ENCOUNTER — Encounter: Payer: Self-pay | Admitting: Physical Therapy

## 2022-05-13 ENCOUNTER — Ambulatory Visit: Payer: Medicare PPO | Admitting: Physical Therapy

## 2022-05-13 DIAGNOSIS — M6281 Muscle weakness (generalized): Secondary | ICD-10-CM | POA: Diagnosis not present

## 2022-05-13 DIAGNOSIS — R262 Difficulty in walking, not elsewhere classified: Secondary | ICD-10-CM

## 2022-05-13 DIAGNOSIS — M542 Cervicalgia: Secondary | ICD-10-CM

## 2022-05-13 DIAGNOSIS — R2681 Unsteadiness on feet: Secondary | ICD-10-CM | POA: Diagnosis not present

## 2022-05-13 DIAGNOSIS — R252 Cramp and spasm: Secondary | ICD-10-CM | POA: Diagnosis not present

## 2022-05-13 NOTE — Therapy (Signed)
OUTPATIENT PHYSICAL THERAPY THORACOLUMBAR TREATMENT   Patient Name: Connor [name] MRN: [id number] DOB:10/08/1933, 89 y.o., male Today's Date: 05/13/2022  END OF SESSION:  PT End of Session - 05/13/22 1314     Visit Number 7    Number of Visits 13    Date for PT Re-Evaluation 07/03/22    Authorization Type [employer]    PT Start Time 1311    PT Stop Time 1356    PT Time Calculation (min) 45 min    Activity Tolerance Patient tolerated treatment well    Behavior During Therapy WFL for tasks assessed/performed             Past Medical History:  Diagnosis Date   Benign localized prostatic hyperplasia with lower urinary tract symptoms (LUTS)    urologist-- dr Diona Fanti   Cancer [facility])    prostate   Chronic anticoagulation    Xarelto for afib   Chronic constipation    Chronic cystitis    Gross hematuria    History of DVT of lower extremity 06/08/2010   right femoral dvt dx post op total hip 05-08-2010   History of gastric ulcer    remote   History of kidney stones    one stone. remote hx   Hypercholesteremia    Hyperlipidemia    Hypertension    Dx by Dr. Lance Muss around age  39    Macular degeneration    dry   NICM (nonischemic cardiomyopathy) ([place])    per echo 06-11-2017,  ef 45-50% with diffuse hypokinesis,  G1DD   OSA on CPAP 2017   compliant with CPAP.  managed by Dr Elsworth Soho.   Osteoarthritis    Other urethral stricture, male, meatal    chronic;  pt self dilates   Paroxysmal atrial fibrillation [facility]) cardiologist-- dr Rayann Heman   first dx 2009/   a. documented by Dr Barrett Shell on office EKG 9/12. b. maintained on Tikosyn and Xarelto. c. s/p DCCV 's.  d.  x3  EP w/ ablation atrial fib last one 12-05-2016   Prediabetes    Premature ventricular contraction    PVC's (premature ventricular contractions)    RA (rheumatoid arthritis) ([place])    rheumatologist--- Dr Page Spiro,  treated with Humira   RBBB (right bundle branch block)    Hx of a fib, ablation Aug 2018    Rheumatoid arthritis ([place])    Vitamin D deficiency    Wears hearing aid in both ears    Past Surgical History:  Procedure Laterality Date   ABLATION OF DYSRHYTHMIC FOCUS  08/29/2015   ATRIAL FIBRILLATION ABLATION  12/05/2016   ATRIAL FIBRILLATION ABLATION N/A 12/05/2016   Procedure: Atrial Fibrillation Ablation;  Surgeon: [name], MD;  Location: [place] CV [facility];  Service: Cardiovascular;  Laterality: N/A;   ATRIAL FIBRILLATION ABLATION N/A 02/29/2020   Procedure: ATRIAL FIBRILLATION ABLATION;  Surgeon: [name], MD;  Location: [place] CV [facility];  Service: Cardiovascular;  Laterality: N/A;   ATRIAL FLUTTER ABLATION  09/2010   by Glenford Bayley Right 1990s   CARDIOVERSION N/A 10/28/2012   Procedure: CARDIOVERSION;  Surgeon: Burnell Blanks, MD;  Location: [place];  Service: Cardiovascular;  Laterality: N/A;   CATARACT EXTRACTION W/ INTRAOCULAR LENS  IMPLANT, BILATERAL  2019   CYSTOSCOPY WITH BIOPSY N/A 09/03/2018   Procedure: CYSTOSCOPY WITH FULGURATION Fran Lowes;  Surgeon: Franchot Gallo, MD;  [facility];  Service: Urology;  Laterality: N/A;  [place] N/A 01/23/2022  Procedure: CYSTOSCOPY WITH FULGERATION OF PROSTATIC URETHRA;  Surgeon: Franchot Gallo, MD;  [facility];  Service: Urology;  Laterality: N/A;  [place] N/A 08/29/2015   Procedure: Atrial Fibrillation Ablation;  Surgeon: [name], MD;  Location: [place] CV [facility];  Service: Cardiovascular;  Laterality: N/A;   KNEE SURGERY Bilateral x3   1960s & 1970s   open   LEFT HEART CATHETERIZATION WITH CORONARY ANGIOGRAM N/A 06/07/2011   Procedure: LEFT HEART CATHETERIZATION WITH CORONARY ANGIOGRAM;  Surgeon: Peter M Martinique, MD;  Location: [facility] CATH LAB;  Service: Cardiovascular;  Laterality: N/A;    Normal coronary arteries,  low normal LVF   TONSILLECTOMY AND ADENOIDECTOMY  age 68   TOTAL HIP ARTHROPLASTY Right 06/04/10     dr Noemi Chapel  _0    TOTAL KNEE ARTHROPLASTY Bilateral right 1995;  left [place]  06-29-2002   dr humphries  _1    TRANSURETHRAL RESECTION OF PROSTATE N/A 07/06/2018   Procedure: TRANSURETHRAL RESECTION OF THE PROSTATE (TURP);  Surgeon: Franchot Gallo, MD;  Location: [facility];  Service: Urology;  Laterality: N/A;  64 MINS   Patient Active Problem List   Diagnosis Date Noted   Hematuria 01/04/2022   Gross hematuria 01/04/2022   Secondary hypercoagulable state ([place]) 03/28/2020   Malignant neoplasm of prostate ([place]) 09/11/2018   Enlarged prostate with urinary obstruction 07/06/2018   Gastrocnemius strain, right, initial encounter 12/10/2017   Baker's cyst of knee, right 12/10/2017   Obesity 03/24/2017   Paroxysmal atrial fibrillation ([place]) 12/05/2016   Near syncope 04/20/2016   Leg hematoma, left, initial encounter 04/20/2016   Bleeding    Ecchymosis    Left hamstring muscle strain    Muscle tear    RBBB 35/82/5189   Chronic systolic dysfunction of left ventricle 07/11/2014   OSA (obstructive sleep apnea) 04/07/2013   Multinodular goiter 01/20/2013   Cervical spondylosis without myelopathy 01/18/2013   Atrial fibrillation with RVR ([place]) 10/27/2012   Left ventricular dysfunction 12/17/2011   Low back pain 09/11/2011   Fatigue 05/22/2011   PVC (premature ventricular contraction) 01/18/2011   Persistent atrial fibrillation () 01/12/2011   Hyperlipidemia 08/02/2010   Hypertension 08/02/2010   Osteoarthritis 08/02/2010   BPH (benign prostatic hyperplasia) 08/02/2010   GERD (gastroesophageal reflux disease) 08/02/2010   h/o Atrial flutter ([place]) s/p ablation 2012     PCP: Copland, MD  REFERRING PROVIDER: Copland, MD  REFERRING DIAG: Debility  Rationale for Evaluation and Treatment: Rehabilitation  THERAPY DIAG:  Muscle weakness (generalized)  Difficulty in walking, not elsewhere classified  Unsteadiness on  feet  Cervicalgia  Cramp and spasm  ONSET DATE: August 2023  SUBJECTIVE:  SUBJECTIVE STATEMENT: I was very tired and could tell I had a good workout, no pain PERTINENT HISTORY:  Afib  PAIN: has knee and hip issues with DJD  PRECAUTIONS: None  WEIGHT BEARING RESTRICTIONS: No  FALLS:  Has patient fallen in last 6 months? No  LIVING ENVIRONMENT: Lives with: lives with their family Lives in: House/apartment Stairs: Yes: Internal: 12 steps; can reach both Has following equipment at home: None  OCCUPATION: retired  PLOF: Independent  was working out at [employer] 2x/week  PATIENT GOALS: be stronger, move better, return to the gym  NEXT MD VISIT:   OBJECTIVE:   DIAGNOSTIC FINDINGS:  none  COGNITION: Overall cognitive status: Within functional limits for tasks assessed     SENSATION: WFL  MUSCLE LENGTH: Hamstrings: Right 45 deg; Left 45 deg POSTURE: rounded shoulders and forward head  PALPATION: Non tender  LUMBAR ROM: decreased 50% due to stiffness  LOWER EXTREMITY ROM:     Active  Right eval Left eval  Hip flexion    Hip extension    Hip abduction    Hip adduction    Hip internal rotation    Hip external rotation    Knee flexion    Knee extension 10 15  Ankle dorsiflexion    Ankle plantarflexion    Ankle inversion    Ankle eversion     (Blank rows = not tested)  LOWER EXTREMITY MMT:    MMT Right eval Left eval  Hip flexion 3+ 3+  Hip extension 4- 4-  Hip abduction 3+ 3+  Hip adduction    Hip internal rotation    Hip external rotation    Knee flexion 4 4  Knee extension 3+ 3+  Ankle dorsiflexion 4 4  Ankle plantarflexion 3+ 3+  Ankle inversion    Ankle eversion     (Blank rows = not tested)   FUNCTIONAL TESTS:  5 times sit to stand: 25  seconds Timed up and go (TUG): 22  seconds 6 minute walk test:  had to stop at 3 minutes at 540', got very SOB and was unsteady  GAIT: Distance walked: 100' Assistive device utilized: None Level of assistance: Complete Independence Comments: slow, WBOS, stairs one at a time  TODAY'S TREATMENT:                                                                                                                              DATE:  05/13/22 Nustep Level 5 x 6 minutes Bike Level 4 x 6 minutes Side step on and off airex On airex cone toe touches On airex ball toss Eyes closed on airex Leg extension 5# 2x10 Leg curls 35# 2x10 30# resisted walking all directions Passive stretch HS and piriformis  05/10/22 UBE level 3 x 5 minutes Bike level 4 x 5 minutes Nustep level 5 x 6 minutes Chest press 10# 3x10 Seated rows 35# 3x10 Lats 35# 3x10 Triceps 35# this was a struggle  for him, decreased reps Biceps 10# 2x10 LEg curls 35# Leg extension 10# Passive HS and piriformis stretch  05/08/22 Bike level 4 x 6 minutes Nustep level 6 x 6 minutes Seated row 25# 3x10 Lats 25# 3x10 Chest press 10# 3x10 Triceps 35# 2x10 Biceps 10# 2x10 Tmill push fwd backward 15 seconds x3 each Feet on ball K2C, trunk rotation, small bridges and isometric abs Passive HS and piriformis stretch  04/26/22 Nustep Level 5x6 minutes Bike level 4 x 5 minutes Seated rows 20# 2x10\ LAts 20# 2x10 10# straight arm pulls LEg curls 25# 2x10 Triceps 25# Biceps 5# Feet on ball K2C, trunk rotation, small bridge and isometric abs HS stretches   04/23/22 Nustep Level 5 x 6 minutes Bike Level 4 x 6 minutes Leg curls 35# 2x10 Seated rows 20# Lats 20# 10# straight arm pulls 5# biceps 2x10 Triceps 25# 2x10 Feet on ball K2C, trunk rotation, small bridges and isometric abs Passive stretch HS  04/16/22 Nustep level 5 x 6 minutes Bike level 5 x 5 minutes UBE level 3 x 4 minutes Leg curls 35# 2x10 STS 10x from mat  without UE- slightly elevated mat LAQ 3 # 2x10 Seated row 20# Lats 20# Triceps 25# Feet on ball K2C, trunk rotation, small bridges, isometric abs  04/08/22 Nustep level 5 x 6 minutes    PATIENT EDUCATION:  Education details: POC Person educated: Patient Education method: Explanation Education comprehension: verbalized understanding  HOME EXERCISE PROGRAM: TBD  ASSESSMENT:  CLINICAL IMPRESSION: Patient had an afternoon appointment with me today, this seemed to wear him out, he c/o fatigue and tired.  He did struggle some with the dynamic balance activities.  OBJECTIVE IMPAIRMENTS: Abnormal gait, cardiopulmonary status limiting activity, decreased activity tolerance, decreased balance, decreased endurance, decreased mobility, difficulty walking, decreased ROM, decreased strength, and impaired flexibility.   REHAB POTENTIAL: Good  CLINICAL DECISION MAKING: Stable/uncomplicated  EVALUATION COMPLEXITY: Low   GOALS: Goals reviewed with patient? Yes  SHORT TERM GOALS: Target date: 04/22/22  Independent with initial HEP Goal status: met  LONG TERM GOALS: Target date: 07/03/22  Understand posture and body mechanics Goal status: ongoing  2.  Decrease TUG time to 13 seconds Goal status:ongoing  3.  Decrease 5x STS to 15 seconds Goal status: INITIAL  4.  Increase 6 minute walk test to 1200 feet Goal status: ongoing  5.  Return safely to the gym Goal status: ongoing  PLAN:  PT FREQUENCY: 1-2x/week  PT DURATION: 12 weeks  PLANNED INTERVENTIONS: Therapeutic exercises, Therapeutic activity, Neuromuscular re-education, Balance training, Gait training, Patient/Family education, Self Care, Joint mobilization, Stair training, and Manual therapy.  PLAN FOR NEXT SESSION: continue to add as he tolerates may reassess the walk test   Sumner Boast, PT 05/13/2022, 1:15 PM

## 2022-05-15 ENCOUNTER — Encounter: Payer: Self-pay | Admitting: Physical Therapy

## 2022-05-15 ENCOUNTER — Ambulatory Visit: Payer: Medicare PPO | Admitting: Physical Therapy

## 2022-05-15 DIAGNOSIS — M6281 Muscle weakness (generalized): Secondary | ICD-10-CM | POA: Diagnosis not present

## 2022-05-15 DIAGNOSIS — D3617 Benign neoplasm of peripheral nerves and autonomic nervous system of trunk, unspecified: Secondary | ICD-10-CM | POA: Diagnosis not present

## 2022-05-15 DIAGNOSIS — R2681 Unsteadiness on feet: Secondary | ICD-10-CM

## 2022-05-15 DIAGNOSIS — L821 Other seborrheic keratosis: Secondary | ICD-10-CM | POA: Diagnosis not present

## 2022-05-15 DIAGNOSIS — M542 Cervicalgia: Secondary | ICD-10-CM | POA: Diagnosis not present

## 2022-05-15 DIAGNOSIS — R262 Difficulty in walking, not elsewhere classified: Secondary | ICD-10-CM

## 2022-05-15 DIAGNOSIS — L570 Actinic keratosis: Secondary | ICD-10-CM | POA: Diagnosis not present

## 2022-05-15 DIAGNOSIS — R252 Cramp and spasm: Secondary | ICD-10-CM | POA: Diagnosis not present

## 2022-05-15 NOTE — Therapy (Signed)
OUTPATIENT PHYSICAL THERAPY THORACOLUMBAR TREATMENT   Patient Name: Connor [name] MRN: [id number] DOB:1933/10/07, 89 y.o., male Today's Date: 05/15/2022  END OF SESSION:  PT End of Session - 05/15/22 0937     Visit Number 8    Number of Visits 13    Date for PT Re-Evaluation 07/03/22    Authorization Type [employer]    PT Start Time 0930    PT Stop Time 1010    PT Time Calculation (min) 40 min    Activity Tolerance Patient tolerated treatment well    Behavior During Therapy [facility] for tasks assessed/performed             Past Medical History:  Diagnosis Date   Benign localized prostatic hyperplasia with lower urinary tract symptoms (LUTS)    urologist-- dr Diona Fanti   Cancer [facility])    prostate   Chronic anticoagulation    Xarelto for afib   Chronic constipation    Chronic cystitis    Gross hematuria    History of DVT of lower extremity 06/08/2010   right femoral dvt dx post op total hip 05-08-2010   History of gastric ulcer    remote   History of kidney stones    one stone. remote hx   Hypercholesteremia    Hyperlipidemia    Hypertension    Dx by Dr. Lance Muss around age  75    Macular degeneration    dry   NICM (nonischemic cardiomyopathy) ([place])    per echo 06-11-2017,  ef 45-50% with diffuse hypokinesis,  G1DD   OSA on CPAP 2017   compliant with CPAP.  managed by Dr Elsworth Soho.   Osteoarthritis    Other urethral stricture, male, meatal    chronic;  pt self dilates   Paroxysmal atrial fibrillation [facility]) cardiologist-- dr Rayann Heman   first dx 2009/   a. documented by Dr Barrett Shell on office EKG 9/12. b. maintained on Tikosyn and Xarelto. c. s/p DCCV 's.  d.  x3  EP w/ ablation atrial fib last one 12-05-2016   Prediabetes    Premature ventricular contraction    PVC's (premature ventricular contractions)    RA (rheumatoid arthritis) ([place])    rheumatologist--- Dr Page Spiro,  treated with Humira   RBBB (right bundle branch block)    Hx of a fib, ablation Aug 2018    Rheumatoid arthritis ([place])    Vitamin D deficiency    Wears hearing aid in both ears    Past Surgical History:  Procedure Laterality Date   ABLATION OF DYSRHYTHMIC FOCUS  08/29/2015   ATRIAL FIBRILLATION ABLATION  12/05/2016   ATRIAL FIBRILLATION ABLATION N/A 12/05/2016   Procedure: Atrial Fibrillation Ablation;  Surgeon: [name], MD;  Location: [place] [facility];  Service: Cardiovascular;  Laterality: N/A;   ATRIAL FIBRILLATION ABLATION N/A 02/29/2020   Procedure: ATRIAL FIBRILLATION ABLATION;  Surgeon: [name], MD;  Location: [place] CV [facility];  Service: Cardiovascular;  Laterality: N/A;   ATRIAL FLUTTER ABLATION  09/2010   by Glenford Bayley Right 1990s   CARDIOVERSION N/A 10/28/2012   Procedure: CARDIOVERSION;  Surgeon: Burnell Blanks, MD;  Location: [place];  Service: Cardiovascular;  Laterality: N/A;   CATARACT EXTRACTION W/ INTRAOCULAR LENS  IMPLANT, BILATERAL  2019   CYSTOSCOPY WITH BIOPSY N/A 09/03/2018   Procedure: CYSTOSCOPY WITH FULGURATION Fran Lowes;  Surgeon: Franchot Gallo, MD;  [facility];  Service: Urology;  Laterality: N/A;  [place] N/A 01/23/2022  Procedure: CYSTOSCOPY WITH FULGERATION OF PROSTATIC URETHRA;  Surgeon: Franchot Gallo, MD;  [facility];  Service: Urology;  Laterality: N/A;  [place] N/A 08/29/2015   Procedure: Atrial Fibrillation Ablation;  Surgeon: [name], MD;  Location: [place] CV LAB;  Service: Cardiovascular;  Laterality: N/A;   KNEE SURGERY Bilateral x3   1960s & 1970s   open   LEFT HEART CATHETERIZATION WITH CORONARY ANGIOGRAM N/A 06/07/2011   Procedure: LEFT HEART CATHETERIZATION WITH CORONARY ANGIOGRAM;  Surgeon: Peter M Martinique, MD;  Location: [facility] CATH LAB;  Service: Cardiovascular;  Laterality: N/A;    Normal coronary arteries,  low normal LVF   TONSILLECTOMY AND ADENOIDECTOMY  age 65   TOTAL HIP ARTHROPLASTY Right 06/04/10     dr Noemi Chapel  _0    TOTAL KNEE ARTHROPLASTY Bilateral right 1995;  left [place]  06-29-2002   dr humphries  _1    TRANSURETHRAL RESECTION OF PROSTATE N/A 07/06/2018   Procedure: TRANSURETHRAL RESECTION OF THE PROSTATE (TURP);  Surgeon: Franchot Gallo, MD;  Location: [facility];  Service: Urology;  Laterality: N/A;  23 MINS   Patient Active Problem List   Diagnosis Date Noted   Hematuria 01/04/2022   Gross hematuria 01/04/2022   Secondary hypercoagulable state ([place]) 03/28/2020   Malignant neoplasm of prostate ([place]) 09/11/2018   Enlarged prostate with urinary obstruction 07/06/2018   Gastrocnemius strain, right, initial encounter 12/10/2017   Baker's cyst of knee, right 12/10/2017   Obesity 03/24/2017   Paroxysmal atrial fibrillation ([place]) 12/05/2016   Near syncope 04/20/2016   Leg hematoma, left, initial encounter 04/20/2016   Bleeding    Ecchymosis    Left hamstring muscle strain    Muscle tear    RBBB 12/45/8099   Chronic systolic dysfunction of left ventricle 07/11/2014   OSA (obstructive sleep apnea) 04/07/2013   Multinodular goiter 01/20/2013   Cervical spondylosis without myelopathy 01/18/2013   Atrial fibrillation with RVR ([place]) 10/27/2012   Left ventricular dysfunction 12/17/2011   Low back pain 09/11/2011   Fatigue 05/22/2011   PVC (premature ventricular contraction) 01/18/2011   Persistent atrial fibrillation ([place]) 01/12/2011   Hyperlipidemia 08/02/2010   Hypertension 08/02/2010   Osteoarthritis 08/02/2010   BPH (benign prostatic hyperplasia) 08/02/2010   GERD (gastroesophageal reflux disease) 08/02/2010   h/o Atrial flutter ([place]) s/p ablation 2012     PCP: Copland, MD  REFERRING PROVIDER: Copland, MD  REFERRING DIAG: Debility  Rationale for Evaluation and Treatment: Rehabilitation  THERAPY DIAG:  Muscle weakness (generalized)  Difficulty in walking, not elsewhere classified  Unsteadiness on  feet  ONSET DATE: August 2023  SUBJECTIVE:  SUBJECTIVE STATEMENT: I will be having the watchman procedure I will need to cancel appointments on the 22nd and 25th.  I asked him to find out any limitations to lifting and or exercise for after PERTINENT HISTORY:  Afib  PAIN: has knee and hip issues with DJD  PRECAUTIONS: None  WEIGHT BEARING RESTRICTIONS: No  FALLS:  Has patient fallen in last 6 months? No  LIVING ENVIRONMENT: Lives with: lives with their family Lives in: House/apartment Stairs: Yes: Internal: 12 steps; can reach both Has following equipment at home: None  OCCUPATION: retired  PLOF: Independent  was working out at [employer] 2x/week  PATIENT GOALS: be stronger, move better, return to the gym  NEXT MD VISIT:   OBJECTIVE:   DIAGNOSTIC FINDINGS:  none  COGNITION: Overall cognitive status: Within functional limits for tasks assessed     SENSATION: WFL  MUSCLE LENGTH: Hamstrings: Right 45 deg; Left 45 deg POSTURE: rounded shoulders and forward head  PALPATION: Non tender  LUMBAR ROM: decreased 50% due to stiffness  LOWER EXTREMITY ROM:     Active  Right eval Left eval  Hip flexion    Hip extension    Hip abduction    Hip adduction    Hip internal rotation    Hip external rotation    Knee flexion    Knee extension 10 15  Ankle dorsiflexion    Ankle plantarflexion    Ankle inversion    Ankle eversion     (Blank rows = not tested)  LOWER EXTREMITY MMT:    MMT Right eval Left eval  Hip flexion 3+ 3+  Hip extension 4- 4-  Hip abduction 3+ 3+  Hip adduction    Hip internal rotation    Hip external rotation    Knee flexion 4 4  Knee extension 3+ 3+  Ankle dorsiflexion 4 4  Ankle plantarflexion 3+ 3+  Ankle inversion    Ankle eversion     (Blank  rows = not tested)   FUNCTIONAL TESTS:  5 times sit to stand: 25 seconds Timed up and go (TUG): 22  seconds 6 minute walk test:  had to stop at 3 minutes at 540', got very SOB and was unsteady  GAIT: Distance walked: 100' Assistive device utilized: None Level of assistance: Complete Independence Comments: slow, WBOS, stairs one at a time  TODAY'S TREATMENT:                                                                                                                              DATE:  05/15/22 UBE level 4 x 5 minutes Bike level 4 x 6 minutes Seated row 25# Lats 25# Chest press 10# On airex cone toe touches and ball toss Biceps 5# Triceps 25# Green tband clamshells Bridges Feet on ball K2C, bridges and isometric abs Passive stretch hS and piriformis  05/13/22 Nustep Level 5 x 6 minutes Bike Level 4 x 6 minutes Side step on  and off airex On airex cone toe touches On airex ball toss Eyes closed on airex Leg extension 5# 2x10 Leg curls 35# 2x10 30# resisted walking all directions Passive stretch HS and piriformis  05/10/22 UBE level 3 x 5 minutes Bike level 4 x 5 minutes Nustep level 5 x 6 minutes Chest press 10# 3x10 Seated rows 35# 3x10 Lats 35# 3x10 Triceps 35# this was a struggle for him, decreased reps Biceps 10# 2x10 LEg curls 35# Leg extension 10# Passive HS and piriformis stretch  05/08/22 Bike level 4 x 6 minutes Nustep level 6 x 6 minutes Seated row 25# 3x10 Lats 25# 3x10 Chest press 10# 3x10 Triceps 35# 2x10 Biceps 10# 2x10 Tmill push fwd backward 15 seconds x3 each Feet on ball K2C, trunk rotation, small bridges and isometric abs Passive HS and piriformis stretch  04/26/22 Nustep Level 5x6 minutes Bike level 4 x 5 minutes Seated rows 20# 2x10\ LAts 20# 2x10 10# straight arm pulls LEg curls 25# 2x10 Triceps 25# Biceps 5# Feet on ball K2C, trunk rotation, small bridge and isometric abs HS stretches  PATIENT EDUCATION:  Education  details: POC Person educated: Patient Education method: Explanation Education comprehension: verbalized understanding  HOME EXERCISE PROGRAM: TBD  ASSESSMENT:  CLINICAL IMPRESSION: Patient continues to struggle some on the dynamic surface standing.  He did better today with fatigue but it was an earlier appointment.  Has some wrist pain with biceps so that is why we are doing light weight.  Will have watchman procedure on the 25th  OBJECTIVE IMPAIRMENTS: Abnormal gait, cardiopulmonary status limiting activity, decreased activity tolerance, decreased balance, decreased endurance, decreased mobility, difficulty walking, decreased ROM, decreased strength, and impaired flexibility.   REHAB POTENTIAL: Good  CLINICAL DECISION MAKING: Stable/uncomplicated  EVALUATION COMPLEXITY: Low   GOALS: Goals reviewed with patient? Yes  SHORT TERM GOALS: Target date: 04/22/22  Independent with initial HEP Goal status: met  LONG TERM GOALS: Target date: 07/03/22  Understand posture and body mechanics Goal status: progressing  2.  Decrease TUG time to 13 seconds Goal status:ongoing  3.  Decrease 5x STS to 15 seconds Goal status: ongoing  4.  Increase 6 minute walk test to 1200 feet Goal status: ongoing  5.  Return safely to the gym Goal status: ongoing  PLAN:  PT FREQUENCY: 1-2x/week  PT DURATION: 12 weeks  PLANNED INTERVENTIONS: Therapeutic exercises, Therapeutic activity, Neuromuscular re-education, Balance training, Gait training, Patient/Family education, Self Care, Joint mobilization, Stair training, and Manual therapy.  PLAN FOR NEXT SESSION: continue to add as he tolerates may reassess the walk test   Sumner Boast, PT 05/15/2022, 9:39 AM

## 2022-05-16 DIAGNOSIS — B351 Tinea unguium: Secondary | ICD-10-CM | POA: Diagnosis not present

## 2022-05-16 DIAGNOSIS — M21961 Unspecified acquired deformity of right lower leg: Secondary | ICD-10-CM | POA: Diagnosis not present

## 2022-05-16 DIAGNOSIS — M21962 Unspecified acquired deformity of left lower leg: Secondary | ICD-10-CM | POA: Diagnosis not present

## 2022-05-16 DIAGNOSIS — I739 Peripheral vascular disease, unspecified: Secondary | ICD-10-CM | POA: Diagnosis not present

## 2022-05-16 DIAGNOSIS — L84 Corns and callosities: Secondary | ICD-10-CM | POA: Diagnosis not present

## 2022-05-16 DIAGNOSIS — L603 Nail dystrophy: Secondary | ICD-10-CM | POA: Diagnosis not present

## 2022-05-20 ENCOUNTER — Ambulatory Visit: Payer: Medicare PPO | Admitting: Physical Therapy

## 2022-05-20 ENCOUNTER — Encounter: Payer: Self-pay | Admitting: Physical Therapy

## 2022-05-20 DIAGNOSIS — R262 Difficulty in walking, not elsewhere classified: Secondary | ICD-10-CM | POA: Diagnosis not present

## 2022-05-20 DIAGNOSIS — M542 Cervicalgia: Secondary | ICD-10-CM

## 2022-05-20 DIAGNOSIS — R2681 Unsteadiness on feet: Secondary | ICD-10-CM | POA: Diagnosis not present

## 2022-05-20 DIAGNOSIS — R252 Cramp and spasm: Secondary | ICD-10-CM

## 2022-05-20 DIAGNOSIS — M6281 Muscle weakness (generalized): Secondary | ICD-10-CM

## 2022-05-20 NOTE — Therapy (Signed)
OUTPATIENT PHYSICAL THERAPY THORACOLUMBAR TREATMENT   Patient Name: Connor [name] MRN: [id number] DOB:12/24/1933, 89 y.o., male Today's Date: 05/20/2022  END OF SESSION:  PT End of Session - 05/20/22 0923     Visit Number 9    Number of Visits 13    Date for PT Re-Evaluation 07/03/22    Authorization Type [employer]    PT Start Time [id number]    PT Stop Time 1011    PT Time Calculation (min) 48 min    Activity Tolerance Patient tolerated treatment well    Behavior During Therapy [facility] for tasks assessed/performed             Past Medical History:  Diagnosis Date   Benign localized prostatic hyperplasia with lower urinary tract symptoms (LUTS)    urologist-- dr Diona Fanti   Cancer [facility])    prostate   Chronic anticoagulation    Xarelto for afib   Chronic constipation    Chronic cystitis    Gross hematuria    History of DVT of lower extremity 06/08/2010   right femoral dvt dx post op total hip 05-08-2010   History of gastric ulcer    remote   History of kidney stones    one stone. remote hx   Hypercholesteremia    Hyperlipidemia    Hypertension    Dx by Dr. Lance Muss around age  37    Macular degeneration    dry   NICM (nonischemic cardiomyopathy) ([place])    per echo 06-11-2017,  ef 45-50% with diffuse hypokinesis,  G1DD   OSA on CPAP 2017   compliant with CPAP.  managed by Dr Elsworth Soho.   Osteoarthritis    Other urethral stricture, male, meatal    chronic;  pt self dilates   Paroxysmal atrial fibrillation [facility]) cardiologist-- dr Rayann Heman   first dx 2009/   a. documented by Dr Barrett Shell on office EKG 9/12. b. maintained on Tikosyn and Xarelto. c. s/p DCCV 's.  d.  x3  EP w/ ablation atrial fib last one 12-05-2016   Prediabetes    Premature ventricular contraction    PVC's (premature ventricular contractions)    RA (rheumatoid arthritis) ([place])    rheumatologist--- Dr Page Spiro,  treated with Humira   RBBB (right bundle branch block)    Hx of a fib, ablation Aug 2018    Rheumatoid arthritis ([place])    Vitamin D deficiency    Wears hearing aid in both ears    Past Surgical History:  Procedure Laterality Date   ABLATION OF DYSRHYTHMIC FOCUS  08/29/2015   ATRIAL FIBRILLATION ABLATION  12/05/2016   ATRIAL FIBRILLATION ABLATION N/A 12/05/2016   Procedure: Atrial Fibrillation Ablation;  Surgeon: [name], MD;  Location: [place] CV [facility];  Service: Cardiovascular;  Laterality: N/A;   ATRIAL FIBRILLATION ABLATION N/A 02/29/2020   Procedure: ATRIAL FIBRILLATION ABLATION;  Surgeon: [name], MD;  Location: [place] CV LAB;  Service: Cardiovascular;  Laterality: N/A;   ATRIAL FLUTTER ABLATION  09/2010   by Glenford Bayley Right 1990s   CARDIOVERSION N/A 10/28/2012   Procedure: CARDIOVERSION;  Surgeon: Burnell Blanks, MD;  Location: [place];  Service: Cardiovascular;  Laterality: N/A;   CATARACT EXTRACTION W/ INTRAOCULAR LENS  IMPLANT, BILATERAL  2019   CYSTOSCOPY WITH BIOPSY N/A 09/03/2018   Procedure: CYSTOSCOPY WITH FULGURATION [place];  Surgeon: Franchot Gallo, MD;  Location: [facility];  Service: Urology;  Laterality: N/A;  [place] N/A 01/23/2022  Procedure: CYSTOSCOPY WITH FULGERATION OF PROSTATIC URETHRA;  Surgeon: Franchot Gallo, MD;  Location: [facility];  Service: Urology;  Laterality: N/A;  [place] N/A 08/29/2015   Procedure: Atrial Fibrillation Ablation;  Surgeon: [name], MD;  Location: [place] CV LAB;  Service: Cardiovascular;  Laterality: N/A;   KNEE SURGERY Bilateral x3   1960s & 1970s   open   LEFT HEART CATHETERIZATION WITH CORONARY ANGIOGRAM N/A 06/07/2011   Procedure: LEFT HEART CATHETERIZATION WITH CORONARY ANGIOGRAM;  Surgeon: Peter M Martinique, MD;  Location: [facility] CATH LAB;  Service: Cardiovascular;  Laterality: N/A;    Normal coronary arteries,  low normal LVF   TONSILLECTOMY AND ADENOIDECTOMY  age 68   TOTAL HIP ARTHROPLASTY Right 06/04/10     dr Noemi Chapel  _0    TOTAL KNEE ARTHROPLASTY Bilateral right 1995;  left [place]  06-29-2002   dr humphries  _1    TRANSURETHRAL RESECTION OF PROSTATE N/A 07/06/2018   Procedure: TRANSURETHRAL RESECTION OF THE PROSTATE (TURP);  Surgeon: Franchot Gallo, MD;  Location: [facility];  Service: Urology;  Laterality: N/A;  64 MINS   Patient Active Problem List   Diagnosis Date Noted   Hematuria 01/04/2022   Gross hematuria 01/04/2022   Secondary hypercoagulable state ([place]) 03/28/2020   Malignant neoplasm of prostate ([place]) 09/11/2018   Enlarged prostate with urinary obstruction 07/06/2018   Gastrocnemius strain, right, initial encounter 12/10/2017   Baker's cyst of knee, right 12/10/2017   Obesity 03/24/2017   Paroxysmal atrial fibrillation ([place]) 12/05/2016   Near syncope 04/20/2016   Leg hematoma, left, initial encounter 04/20/2016   Bleeding    Ecchymosis    Left hamstring muscle strain    Muscle tear    RBBB 35/82/5189   Chronic systolic dysfunction of left ventricle 07/11/2014   OSA (obstructive sleep apnea) 04/07/2013   Multinodular goiter 01/20/2013   Cervical spondylosis without myelopathy 01/18/2013   Atrial fibrillation with RVR ([place]) 10/27/2012   Left ventricular dysfunction 12/17/2011   Low back pain 09/11/2011   Fatigue 05/22/2011   PVC (premature ventricular contraction) 01/18/2011   Persistent atrial fibrillation ([place]) 01/12/2011   Hyperlipidemia 08/02/2010   Hypertension 08/02/2010   Osteoarthritis 08/02/2010   BPH (benign prostatic hyperplasia) 08/02/2010   GERD (gastroesophageal reflux disease) 08/02/2010   h/o Atrial flutter ([place]) s/p ablation 2012     PCP: Copland, MD  REFERRING PROVIDER: Copland, MD  REFERRING DIAG: Debility  Rationale for Evaluation and Treatment: Rehabilitation  THERAPY DIAG:  Muscle weakness (generalized)  Difficulty in walking, not elsewhere classified  Unsteadiness on  feet  Cervicalgia  Cramp and spasm  ONSET DATE: August 2023  SUBJECTIVE:  SUBJECTIVE STATEMENT: I am okay just get really tired and fatigued after this exercise   PAIN: has knee and hip issues with DJD  PRECAUTIONS: None  WEIGHT BEARING RESTRICTIONS: No  FALLS:  Has patient fallen in last 6 months? No  LIVING ENVIRONMENT: Lives with: lives with their family Lives in: House/apartment Stairs: Yes: Internal: 12 steps; can reach both Has following equipment at home: None  OCCUPATION: retired  PLOF: Independent  was working out at [employer] 2x/week  PATIENT GOALS: be stronger, move better, return to the gym  NEXT MD VISIT:   OBJECTIVE:   DIAGNOSTIC FINDINGS:  none  COGNITION: Overall cognitive status: Within functional limits for tasks assessed     SENSATION: WFL  MUSCLE LENGTH: Hamstrings: Right 45 deg; Left 45 deg POSTURE: rounded shoulders and forward head  PALPATION: Non tender  LUMBAR ROM: decreased 50% due to stiffness  LOWER EXTREMITY ROM:     Active  Right eval Left eval  Hip flexion    Hip extension    Hip abduction    Hip adduction    Hip internal rotation    Hip external rotation    Knee flexion    Knee extension 10 15  Ankle dorsiflexion    Ankle plantarflexion    Ankle inversion    Ankle eversion     (Blank rows = not tested)  LOWER EXTREMITY MMT:    MMT Right eval Left eval  Hip flexion 3+ 3+  Hip extension 4- 4-  Hip abduction 3+ 3+  Hip adduction    Hip internal rotation    Hip external rotation    Knee flexion 4 4  Knee extension 3+ 3+  Ankle dorsiflexion 4 4  Ankle plantarflexion 3+ 3+  Ankle inversion    Ankle eversion     (Blank rows = not tested)   FUNCTIONAL TESTS:  5 times sit to stand: 25 seconds Timed up and go (TUG): 22   seconds 6 minute walk test:  had to stop at 3 minutes at 540', got very SOB and was unsteady  GAIT: Distance walked: 100' Assistive device utilized: None Level of assistance: Complete Independence Comments: slow, WBOS, stairs one at a time  TODAY'S TREATMENT:                                                                                                                              DATE:  05/20/22 Bike level 4 x 6 minutes Calf stretches Seated rows 25# 3x10 Lats 25# 3x10 Chest press 10# 3x10 difficult for him Side step on and off airex On airex ball toss Leg curls 25# 3x10 Leg extension 5# 3x10 Passive stretch LE's  05/15/22 UBE level 4 x 5 minutes Bike level 4 x 6 minutes Seated row 25# Lats 25# Chest press 10# On airex cone toe touches and ball toss Biceps 5# Triceps 25# Green tband clamshells Bridges Feet on ball K2C, bridges and isometric abs Passive stretch  hS and piriformis  05/13/22 Nustep Level 5 x 6 minutes Bike Level 4 x 6 minutes Side step on and off airex On airex cone toe touches On airex ball toss Eyes closed on airex Leg extension 5# 2x10 Leg curls 35# 2x10 30# resisted walking all directions Passive stretch HS and piriformis  05/10/22 UBE level 3 x 5 minutes Bike level 4 x 5 minutes Nustep level 5 x 6 minutes Chest press 10# 3x10 Seated rows 35# 3x10 Lats 35# 3x10 Triceps 35# this was a struggle for him, decreased reps Biceps 10# 2x10 LEg curls 35# Leg extension 10# Passive HS and piriformis stretch  05/08/22 Bike level 4 x 6 minutes Nustep level 6 x 6 minutes Seated row 25# 3x10 Lats 25# 3x10 Chest press 10# 3x10 Triceps 35# 2x10 Biceps 10# 2x10 Tmill push fwd backward 15 seconds x3 each Feet on ball K2C, trunk rotation, small bridges and isometric abs Passive HS and piriformis stretch  04/26/22 Nustep Level 5x6 minutes Bike level 4 x 5 minutes Seated rows 20# 2x10\ LAts 20# 2x10 10# straight arm pulls LEg curls 25#  2x10 Triceps 25# Biceps 5# Feet on ball K2C, trunk rotation, small bridge and isometric abs HS stretches  PATIENT EDUCATION:  Education details: POC Person educated: Patient Education method: Explanation Education comprehension: verbalized understanding  HOME EXERCISE PROGRAM: TBD  ASSESSMENT:  CLINICAL IMPRESSION: Patient continues to struggle some on the dynamic surface standing.  He does fatigue easily and requires rest, may need to try a 6 minute walk test  OBJECTIVE IMPAIRMENTS: Abnormal gait, cardiopulmonary status limiting activity, decreased activity tolerance, decreased balance, decreased endurance, decreased mobility, difficulty walking, decreased ROM, decreased strength, and impaired flexibility.   REHAB POTENTIAL: Good  CLINICAL DECISION MAKING: Stable/uncomplicated  EVALUATION COMPLEXITY: Low   GOALS: Goals reviewed with patient? Yes  SHORT TERM GOALS: Target date: 04/22/22  Independent with initial HEP Goal status: met  LONG TERM GOALS: Target date: 07/03/22  Understand posture and body mechanics Goal status: progressing  2.  Decrease TUG time to 13 seconds Goal status:ongoing  3.  Decrease 5x STS to 15 seconds Goal status: ongoing  4.  Increase 6 minute walk test to 1200 feet Goal status: ongoing  5.  Return safely to the gym Goal status: ongoing  PLAN:  PT FREQUENCY: 1-2x/week  PT DURATION: 12 weeks  PLANNED INTERVENTIONS: Therapeutic exercises, Therapeutic activity, Neuromuscular re-education, Balance training, Gait training, Patient/Family education, Self Care, Joint mobilization, Stair training, and Manual therapy.  PLAN FOR NEXT SESSION: continue to add as he tolerates may reassess the walk test   Sumner Boast, PT 05/20/2022, 9:24 AM

## 2022-05-22 ENCOUNTER — Ambulatory Visit: Payer: Medicare PPO | Admitting: Physical Therapy

## 2022-05-22 ENCOUNTER — Encounter: Payer: Self-pay | Admitting: Family Medicine

## 2022-05-22 ENCOUNTER — Encounter: Payer: Self-pay | Admitting: Physical Therapy

## 2022-05-22 DIAGNOSIS — D649 Anemia, unspecified: Secondary | ICD-10-CM | POA: Diagnosis not present

## 2022-05-22 DIAGNOSIS — R262 Difficulty in walking, not elsewhere classified: Secondary | ICD-10-CM | POA: Diagnosis not present

## 2022-05-22 DIAGNOSIS — R252 Cramp and spasm: Secondary | ICD-10-CM | POA: Diagnosis not present

## 2022-05-22 DIAGNOSIS — R2681 Unsteadiness on feet: Secondary | ICD-10-CM

## 2022-05-22 DIAGNOSIS — M542 Cervicalgia: Secondary | ICD-10-CM | POA: Diagnosis not present

## 2022-05-22 DIAGNOSIS — M6281 Muscle weakness (generalized): Secondary | ICD-10-CM | POA: Diagnosis not present

## 2022-05-22 DIAGNOSIS — R195 Other fecal abnormalities: Secondary | ICD-10-CM | POA: Diagnosis not present

## 2022-05-22 DIAGNOSIS — Z86010 Personal history of colonic polyps: Secondary | ICD-10-CM | POA: Diagnosis not present

## 2022-05-22 DIAGNOSIS — H353132 Nonexudative age-related macular degeneration, bilateral, intermediate dry stage: Secondary | ICD-10-CM | POA: Diagnosis not present

## 2022-05-22 NOTE — Therapy (Signed)
OUTPATIENT PHYSICAL THERAPY THORACOLUMBAR TREATMENT  Progress Note Reporting Period 04/08/22 to 05/22/22  See note below for Objective Data and Assessment of Progress/Goals.     Patient Name: Connor [name] MRN: [id number] DOB:Jun 17, 1933, 89 y.o., male Today's Date: 05/22/2022  END OF SESSION:  PT End of Session - 05/22/22 0936     Visit Number 10    Number of Visits 13    Date for PT Re-Evaluation 07/03/22    Authorization Type [employer]    PT Start Time 0929    PT Stop Time 1015    PT Time Calculation (min) 46 min    Activity Tolerance Patient tolerated treatment well    Behavior During Therapy [facility] for tasks assessed/performed             Past Medical History:  Diagnosis Date   Benign localized prostatic hyperplasia with lower urinary tract symptoms (LUTS)    urologist-- dr Diona Fanti   [facility]cer [facility])    prostate   Chronic anticoagulation    Xarelto for afib   Chronic constipation    Chronic cystitis    Gross hematuria    History of DVT of lower extremity 06/08/2010   right femoral dvt dx post op total hip 05-08-2010   History of gastric ulcer    remote   History of kidney stones    one stone. remote hx   Hypercholesteremia    Hyperlipidemia    Hypertension    Dx by Dr. Lance Muss around age  56    Macular degeneration    dry   NICM (nonischemic cardiomyopathy) ([place])    per echo 06-11-2017,  ef 45-50% with diffuse hypokinesis,  G1DD   OSA on CPAP 2017   compliant with CPAP.  managed by Dr Elsworth Soho.   Osteoarthritis    Other urethral stricture, male, meatal    chronic;  pt self dilates   Paroxysmal atrial fibrillation [facility]) cardiologist-- dr Rayann Heman   first dx 2009/   a. documented by Dr Barrett Shell on office EKG 9/12. b. maintained on Tikosyn and Xarelto. c. s/p DCCV 's.  d.  x3  EP w/ ablation atrial fib last one 12-05-2016   Prediabetes    Premature ventricular contraction    PVC's (premature ventricular contractions)    RA (rheumatoid arthritis) ([place])     rheumatologist--- Dr Page Spiro,  treated with Humira   RBBB (right bundle branch block)    Hx of a fib, ablation Aug 2018   Rheumatoid arthritis ([place])    Vitamin D deficiency    Wears hearing aid in both ears    Past Surgical History:  Procedure Laterality Date   ABLATION OF DYSRHYTHMIC FOCUS  08/29/2015   ATRIAL FIBRILLATION ABLATION  12/05/2016   ATRIAL FIBRILLATION ABLATION N/A 12/05/2016   Procedure: Atrial Fibrillation Ablation;  Surgeon: [name], MD;  Location: [place] [facility] LAB;  Service: Cardiovascular;  Laterality: N/A;   ATRIAL FIBRILLATION ABLATION N/A 02/29/2020   Procedure: ATRIAL FIBRILLATION ABLATION;  Surgeon: [name], MD;  Location: [place] CV LAB;  Service: Cardiovascular;  Laterality: N/A;   ATRIAL FLUTTER ABLATION  09/2010   by Glenford Bayley Right 1990s   CARDIOVERSION N/A 10/28/2012   Procedure: CARDIOVERSION;  Surgeon: Burnell Blanks, MD;  Location: [place];  Service: Cardiovascular;  Laterality: N/A;   CATARACT EXTRACTION W/ INTRAOCULAR LENS  IMPLANT, BILATERAL  2019   CYSTOSCOPY WITH BIOPSY N/A 09/03/2018   Procedure: CYSTOSCOPY WITH FULGURATION OFURETHRA;  Surgeon: Franchot Gallo,  MD;  [facility];  Service: Urology;  Laterality: N/A;  [place] N/A 01/23/2022   Procedure: CYSTOSCOPY WITH FULGERATION OF PROSTATIC URETHRA;  Surgeon: Franchot Gallo, MD;  [facility];  Service: Urology;  Laterality: N/A;  [place] N/A 08/29/2015   Procedure: Atrial Fibrillation Ablation;  Surgeon: [name], MD;  Location: [place] CV LAB;  Service: Cardiovascular;  Laterality: N/A;   KNEE SURGERY Bilateral x3   1960s & 1970s   open   LEFT HEART CATHETERIZATION WITH CORONARY ANGIOGRAM N/A 06/07/2011   Procedure: LEFT HEART CATHETERIZATION WITH CORONARY ANGIOGRAM;  Surgeon: Peter M Martinique, MD;  Location: [facility] CATH LAB;  Service: Cardiovascular;  Laterality: N/A;     Normal coronary arteries,  low normal LVF   TONSILLECTOMY AND ADENOIDECTOMY  age 29   TOTAL HIP ARTHROPLASTY Right 06/04/10    dr Noemi Chapel  '@[facility]'$    TOTAL KNEE ARTHROPLASTY Bilateral right 1995;  left [place]  06-29-2002   dr humphries  '@[facility]'$    TRANSURETHRAL RESECTION OF PROSTATE N/A 07/06/2018   Procedure: TRANSURETHRAL RESECTION OF THE PROSTATE (TURP);  Surgeon: Franchot Gallo, MD;  Location: [facility];  Service: Urology;  Laterality: N/A;  26 MINS   Patient Active Problem List   Diagnosis Date Noted   Hematuria 01/04/2022   Gross hematuria 01/04/2022   Secondary hypercoagulable state ([place]) 03/28/2020   Malignant neoplasm of prostate ([place]) 09/11/2018   Enlarged prostate with urinary obstruction 07/06/2018   Gastrocnemius strain, right, initial encounter 12/10/2017   Baker's cyst of knee, right 12/10/2017   Obesity 03/24/2017   Paroxysmal atrial fibrillation ([place]) 12/05/2016   Near syncope 04/20/2016   Leg hematoma, left, initial encounter 04/20/2016   Bleeding    Ecchymosis    Left hamstring muscle strain    Muscle tear    RBBB 76/73/4193   Chronic systolic dysfunction of left ventricle 07/11/2014   OSA (obstructive sleep apnea) 04/07/2013   Multinodular goiter 01/20/2013   Cervical spondylosis without myelopathy 01/18/2013   Atrial fibrillation with RVR ([place]) 10/27/2012   Left ventricular dysfunction 12/17/2011   Low back pain 09/11/2011   Fatigue 05/22/2011   PVC (premature ventricular contraction) 01/18/2011   Persistent atrial fibrillation ([place]) 01/12/2011   Hyperlipidemia 08/02/2010   Hypertension 08/02/2010   Osteoarthritis 08/02/2010   BPH (benign prostatic hyperplasia) 08/02/2010   GERD (gastroesophageal reflux disease) 08/02/2010   h/o Atrial flutter ([place]) s/p ablation 2012     PCP: Copland, MD  REFERRING PROVIDER: Copland, MD  REFERRING DIAG: Debility  Rationale for Evaluation and Treatment:  Rehabilitation  THERAPY DIAG:  Muscle weakness (generalized)  Difficulty in walking, not elsewhere classified  Unsteadiness on feet  Cramp and spasm  ONSET DATE: August 2023  SUBJECTIVE:  SUBJECTIVE STATEMENT: Doing okay, going to have the watch man procedure next week  PAIN: has knee and hip issues with DJD  PRECAUTIONS: None  WEIGHT BEARING RESTRICTIONS: No  FALLS:  Has patient fallen in last 6 months? No  LIVING ENVIRONMENT: Lives with: lives with their family Lives in: House/apartment Stairs: Yes: Internal: 12 steps; can reach both Has following equipment at home: None  OCCUPATION: retired  PLOF: Independent  was working out at [employer] 2x/week  PATIENT GOALS: be stronger, move better, return to the gym  NEXT MD VISIT:   OBJECTIVE:   DIAGNOSTIC FINDINGS:  none  COGNITION: Overall cognitive status: Within functional limits for tasks assessed     SENSATION: WFL  MUSCLE LENGTH: Hamstrings: Right 45 deg; Left 45 deg POSTURE: rounded shoulders and forward head  PALPATION: Non tender  LUMBAR ROM: decreased 50% due to stiffness  LOWER EXTREMITY ROM:     Active  Right eval Left eval  Hip flexion    Hip extension    Hip abduction    Hip adduction    Hip internal rotation    Hip external rotation    Knee flexion    Knee extension 10 15  Ankle dorsiflexion    Ankle plantarflexion    Ankle inversion    Ankle eversion     (Blank rows = not tested)  LOWER EXTREMITY MMT:    MMT Right eval Left eval  Hip flexion 3+ 3+  Hip extension 4- 4-  Hip abduction 3+ 3+  Hip adduction    Hip internal rotation    Hip external rotation    Knee flexion 4 4  Knee extension 3+ 3+  Ankle dorsiflexion 4 4  Ankle plantarflexion 3+ 3+  Ankle inversion    Ankle eversion      (Blank rows = not tested)   FUNCTIONAL TESTS:  5 times sit to stand: 25 seconds Timed up and go (TUG): 22  seconds 6 minute walk test:  had to stop at 3 minutes at 540', got very SOB and was unsteady  GAIT: Distance walked: 100' Assistive device utilized: None Level of assistance: Complete Independence Comments: slow, WBOS, stairs one at a time  TODAY'S TREATMENT:                                                                                                                              DATE:  05/22/22 UBE level 4 x 6 mintues Nustep level 5 x 6 minutes Volleyball LEg curls 35# 3x10 Leg extension 5# 3x10 Seated row 25# LAts 25# 3x10 10# straight arm pulls 2x10 Passive stretch LE's  05/20/22 Bike level 4 x 6 minutes Calf stretches Seated rows 25# 3x10 Lats 25# 3x10 Chest press 10# 3x10 difficult for him Side step on and off airex On airex ball toss Leg curls 25# 3x10 Leg extension 5# 3x10 Passive stretch LE's  05/15/22 UBE level 4 x 5 minutes Bike level 4 x 6  minutes Seated row 25# Lats 25# Chest press 10# On airex cone toe touches and ball toss Biceps 5# Triceps 25# Green tband clamshells Bridges Feet on ball K2C, bridges and isometric abs Passive stretch hS and piriformis  05/13/22 Nustep Level 5 x 6 minutes Bike Level 4 x 6 minutes Side step on and off airex On airex cone toe touches On airex ball toss Eyes closed on airex Leg extension 5# 2x10 Leg curls 35# 2x10 30# resisted walking all directions Passive stretch HS and piriformis  05/10/22 UBE level 3 x 5 minutes Bike level 4 x 5 minutes Nustep level 5 x 6 minutes Chest press 10# 3x10 Seated rows 35# 3x10 Lats 35# 3x10 Triceps 35# this was a struggle for him, decreased reps Biceps 10# 2x10 LEg curls 35# Leg extension 10# Passive HS and piriformis stretch  05/08/22 Bike level 4 x 6 minutes Nustep level 6 x 6 minutes Seated row 25# 3x10 Lats 25# 3x10 Chest press 10# 3x10 Triceps 35#  2x10 Biceps 10# 2x10 Tmill push fwd backward 15 seconds x3 each Feet on ball K2C, trunk rotation, small bridges and isometric abs Passive HS and piriformis stretch  PATIENT EDUCATION:  Education details: POC Person educated: Patient Education method: Explanation Education comprehension: verbalized understanding  HOME EXERCISE PROGRAM: TBD  ASSESSMENT:  CLINICAL IMPRESSION: Patient has a procedure next week, he will ask the MD if there are any precautions or if he can continue to come to PT and exercise.  And any limitations on lifting and HR.  He was still very fatigued today and we did not attempt the walk test, but may need to do that after the procedure especially if he regresses OBJECTIVE IMPAIRMENTS: Abnormal gait, cardiopulmonary status limiting activity, decreased activity tolerance, decreased balance, decreased endurance, decreased mobility, difficulty walking, decreased ROM, decreased strength, and impaired flexibility.   REHAB POTENTIAL: Good  CLINICAL DECISION MAKING: Stable/uncomplicated  EVALUATION COMPLEXITY: Low   GOALS: Goals reviewed with patient? Yes  SHORT TERM GOALS: Target date: 04/22/22  Independent with initial HEP Goal status: met  LONG TERM GOALS: Target date: 07/03/22  Understand posture and body mechanics Goal status: progressing  2.  Decrease TUG time to 13 seconds Goal status:ongoing  3.  Decrease 5x STS to 15 seconds Goal status: ongoing  4.  Increase 6 minute walk test to 1200 feet Goal status: ongoing  5.  Return safely to the gym Goal status: ongoing  PLAN:  PT FREQUENCY: 1-2x/week  PT DURATION: 12 weeks  PLANNED INTERVENTIONS: Therapeutic exercises, Therapeutic activity, Neuromuscular re-education, Balance training, Gait training, Patient/Family education, Self Care, Joint mobilization, Stair training, and Manual therapy.  PLAN FOR NEXT SESSION: See after procedure. And follow any guidelines from MD   Sumner Boast,  PT 05/22/2022, 9:37 AM

## 2022-05-23 NOTE — Progress Notes (Signed)
[facility]                                     Cardiology Office Note:    Date:  05/27/2022   ID:  Connor [name], DOB 15-Apr-1934, MRN [id number]  PCP:  Connor Mclean, MD  [facility] Cardiologist:  Connor Ruths, MD  [facility] Electrophysiologist:  Connor Epley, MD   Referring MD: Connor Mclean, MD   Chief Complaint  Patient presents with   Follow-up    Pre LAAO    History of Present Illness:    Connor [name] is a 89 y.o. male with a hx of prostate CA, provoked DVT (2012), PVCs and RBBB, A Flutter Ablation x 1, A Fib Ablation x 3, HTN, HLD, OSA on CPAP, obesity, NICM, PAF with hematuria and anemia who presents today for pre-op evaluation prior to [place] closure with Watchman.   [name] has been followed for his atrial fibrillation with the [facility] and was seen 01/2022 at which time he reported a several week hx of hematuria. Hb went from 12.7 in 12/2021 to 9.2. He underwent bladder cauterization with symptom improvement. Given trouble with hematuria, he was referred to Dr. Quentin [name] for consideration of LAAO closure with Watchman in hopes to avoid long term anticoagulation.   He was seen by Dr. Quentin [name] 03/20/2022. CT imaging showed anatomy suitable for device implant and no thrombus.   Today he presents with his wife and is doing well with no specific complaints including chest pain, palpitations, LE edema, orthopnea, dizziness, syncope, or bleeding in stool or urine. He does report that he will intermittently skip his anticoagulation due to hematuria. He has been consistent for the last 4 days. We had a long discussion regarding consistency with AC and the risk of LAA thrombus with skipping his Xarelto.  Past Medical History:  Diagnosis Date   Benign localized prostatic hyperplasia with lower urinary tract symptoms (LUTS)    urologist-- dr Diona Fanti   Cancer [facility])    prostate   Chronic anticoagulation    Xarelto for afib   Chronic  constipation    Chronic cystitis    Gross hematuria    History of DVT of lower extremity 06/08/2010   right femoral dvt dx post op total hip 05-08-2010   History of gastric ulcer    remote   History of kidney stones    one stone. remote hx   Hypercholesteremia    Hyperlipidemia    Hypertension    Dx by Dr. Lance Muss around age  46    Macular degeneration    dry   NICM (nonischemic cardiomyopathy) ([place])    per echo 06-11-2017,  ef 45-50% with diffuse hypokinesis,  G1DD   OSA on CPAP 2017   compliant with CPAP.  managed by Dr Elsworth Soho.   Osteoarthritis    Other urethral stricture, male, meatal    chronic;  pt self dilates   Paroxysmal atrial fibrillation [facility]) cardiologist-- dr Rayann Heman   first dx 2009/   a. documented by Dr Barrett Shell on office EKG 9/12. b. maintained on Tikosyn and Xarelto. c. s/p DCCV 's.  d.  x3  EP w/ ablation atrial fib last one 12-05-2016   Prediabetes    Premature ventricular contraction    PVC's (premature ventricular contractions)    RA (rheumatoid arthritis) [facility])    rheumatologist--- Dr  A.. Hawkes,  treated with Humira   RBBB (right bundle branch block)    Hx of a fib, ablation Aug 2018   Rheumatoid arthritis ([place])    Vitamin D deficiency    Wears hearing aid in both ears     Past Surgical History:  Procedure Laterality Date   ABLATION OF DYSRHYTHMIC FOCUS  08/29/2015   ATRIAL FIBRILLATION ABLATION  12/05/2016   ATRIAL FIBRILLATION ABLATION N/A 12/05/2016   Procedure: Atrial Fibrillation Ablation;  Surgeon: [name], MD;  Location: [place] CV [facility];  Service: Cardiovascular;  Laterality: N/A;   ATRIAL FIBRILLATION ABLATION N/A 02/29/2020   Procedure: ATRIAL FIBRILLATION ABLATION;  Surgeon: [name], MD;  Location: [place] CV [facility];  Service: Cardiovascular;  Laterality: N/A;   ATRIAL FLUTTER ABLATION  09/2010   by [place] Right 1990s   CARDIOVERSION N/A 10/28/2012   Procedure: CARDIOVERSION;  Surgeon: Burnell Blanks, MD;   Location: [place];  Service: Cardiovascular;  Laterality: N/A;   CATARACT EXTRACTION W/ INTRAOCULAR LENS  IMPLANT, BILATERAL  2019   CYSTOSCOPY WITH BIOPSY N/A 09/03/2018   Procedure: CYSTOSCOPY WITH FULGURATION Fran Lowes;  Surgeon: Franchot Gallo, MD;  Location: [facility];  Service: Urology;  Laterality: N/A;  [place] N/A 01/23/2022   Procedure: CYSTOSCOPY WITH FULGERATION OF PROSTATIC URETHRA;  Surgeon: Franchot Gallo, MD;  Location: [facility];  Service: Urology;  Laterality: N/A;  [place] N/A 08/29/2015   Procedure: Atrial Fibrillation Ablation;  Surgeon: [name], MD;  Location: [place] CV [facility];  Service: Cardiovascular;  Laterality: N/A;   KNEE SURGERY Bilateral x3   1960s & 1970s   open   LEFT HEART CATHETERIZATION WITH CORONARY ANGIOGRAM N/A 06/07/2011   Procedure: LEFT HEART CATHETERIZATION WITH CORONARY ANGIOGRAM;  Surgeon: Peter M Martinique, MD;  Location: [facility] CATH LAB;  Service: Cardiovascular;  Laterality: N/A;    Normal coronary arteries,  low normal LVF   TONSILLECTOMY AND ADENOIDECTOMY  age 30   TOTAL HIP ARTHROPLASTY Right 06/04/10    dr Noemi Chapel  '@[facility]'$    TOTAL KNEE ARTHROPLASTY Bilateral right 1995;  left [place]  06-29-2002   dr humphries  '@[facility]'$    TRANSURETHRAL RESECTION OF PROSTATE N/A 07/06/2018   Procedure: TRANSURETHRAL RESECTION OF THE PROSTATE (TURP);  Surgeon: Franchot Gallo, MD;  Location: [facility];  Service: Urology;  Laterality: N/A;  45 MINS    Current Medications: Current Meds  Medication Sig   acetaminophen (TYLENOL) 500 MG tablet Take 1,000 mg by mouth 2 (two) times daily as needed for moderate pain.   atorvastatin (LIPITOR) 10 MG tablet TAKE 1 TABLET BY MOUTH EVERY DAY   Carboxymethylcellulose Sodium (THERATEARS OP) Apply 1 drop to eye daily as needed (dry eyes).   Cholecalciferol (VITAMIN D3) 5000 UNITS TABS Take 5,000 Units by  mouth daily.   Cranberry 1000 MG CAPS Take 3,000 mg by mouth at bedtime.   Docusate Calcium (STOOL SOFTENER PO) Take 4 tablets by mouth at bedtime.   finasteride (PROSCAR) 5 MG tablet Take 5 mg by mouth every morning.    HUMIRA PEN 40 MG/0.4ML PNKT Inject 40 mg as directed every 7 (seven) days. Every Tuesday   lisinopril (ZESTRIL) 40 MG tablet TAKE 1 TABLET BY MOUTH EVERY DAY   metoprolol succinate (TOPROL-XL) 50 MG 24 hr tablet Take 1 tablet (50 mg total) by mouth in the morning and  at bedtime. Take with or immediately following a meal.   Multiple Vitamins-Minerals (PRESERVISION AREDS 2) CAPS Take 1 capsule by mouth 2 (two) times a day.   omeprazole (PRILOSEC) 20 MG capsule Take 20 mg by mouth daily.   rivaroxaban (XARELTO) 20 MG TABS tablet Take 20 mg by mouth daily with supper.   senna (SENOKOT) 8.6 MG TABS tablet Take 1 tablet by mouth daily. Per patient taking 2 tablets daily   solifenacin (VESICARE) 10 MG tablet Take 10 mg by mouth at bedtime.    sulfamethoxazole-trimethoprim (BACTRIM DS) 800-160 MG tablet Take 1 tablet by mouth 2 (two) times daily.   tamsulosin (FLOMAX) 0.4 MG CAPS capsule Take 1 capsule (0.4 mg total) by mouth daily after supper.   Testosterone 20.25 MG/ACT (1.62%) GEL Apply 1 Pump topically at bedtime. Apply to shoulders   vitamin B-12 (CYANOCOBALAMIN) 500 MCG tablet Take 500 mcg by mouth every evening.      Allergies:   Penicillins   Social History   Socioeconomic History   Marital status: Married    Spouse name: Joycelyn Schmid   Number of children: Not on file   Years of education: Not on file   Highest education level: Not on file  Occupational History   Occupation: Retired  Tobacco Use   Smoking status: Former    Packs/day: 1.00    Years: 30.00    Total pack years: 30.00    Types: Cigarettes    Quit date: 05/06/1978    Years since quitting: 44.0   Smokeless tobacco: Never   Tobacco comments:    Former smoker 03/14/2021  Vaping Use   Vaping Use: Never  used  Substance and Sexual Activity   Alcohol use: Yes    Alcohol/week: 1.0 standard drink of alcohol    Types: 1 Cans of beer per week    Comment: occasionally wine and beer   Drug use: Never   Sexual activity: Not on file  Other Topics Concern   Not on file  Social History Narrative   he patient lives in [place] with his spouse.  Retired.   Social Determinants of Health   Financial Resource Strain: Low Risk  (06/21/2021)   Overall Financial Resource Strain (CARDIA)    Difficulty of Paying Living Expenses: Not hard at all  Food Insecurity: No Food Insecurity (01/08/2022)   Hunger Vital Sign    Worried About Running Out of Food in the Last Year: Never true    Ran Out of Food in the Last Year: Never true  Transportation Needs: No Transportation Needs (01/08/2022)   PRAPARE - Hydrologist (Medical): No    Lack of Transportation (Non-Medical): No  Physical Activity: Insufficiently Active (06/21/2021)   Exercise Vital Sign    Days of Exercise per Week: 2 days    Minutes of Exercise per Session: 50 min  Stress: No Stress Concern Present (06/08/2020)   [place]    Feeling of Stress : Not at all  Social Connections: Moderately Isolated (06/21/2021)   Social Connection and Isolation Panel [NHANES]    Frequency of Communication with Friends and Family: More than three times a week    Frequency of Social Gatherings with Friends and Family: More than three times a week    Attends Religious Services: Never    Marine scientist or Organizations: No    Attends Archivist Meetings: Never    Marital Status: Married  Family History: The patient's family history includes Arrhythmia in his sister; Atrial fibrillation in his brother; Heart attack in his father; Lung disease in his mother. There is no history of Diabetes.  ROS:   Please see the history of present illness.    All  other systems reviewed and are negative.  EKGs/Labs/Other Studies Reviewed:    The following studies were reviewed today:  CT 2022-04-23:  IMPRESSION: 1.  Moderate bi atrial enlargement   2. [place] LAA no thrombus landing zone max 22.9 mm Average 19.9 mm suitable for a 27 mm Watchman FLX device   3.  Superior atrial septal aneurysm see description above   4.  Normal PV anatomy see description above   5.  Normal ascending thoracic aorta 3.3 cm   6.  No pericardial effusion   7.  Optimal deployment angle RAO 34 Caudal 27 degrees  EKG:  EKG is ordered today.  The ekg ordered today demonstrates SB with HR 58bpm  Recent Labs: 03/18/2022: ALT 14; Platelets 173.0; TSH 1.30 03/20/2022: BUN 13; Creatinine, Ser 0.83; Potassium 3.9; Sodium 146 04/15/2022: Hemoglobin 10.2  Recent Lipid Panel    Component Value Date/Time   CHOL 156 03/12/2021 1004   TRIG 94.0 03/12/2021 1004   HDL 46.70 03/12/2021 1004   CHOLHDL 3 03/12/2021 1004   VLDL 18.8 03/12/2021 1004   LDLCALC 91 03/12/2021 1004   Risk Assessment/Calculations:    HAS-BLED score 3 Hypertension Yes  Abnormal renal and liver function (Dialysis, transplant, Cr >2.26 mg/dL /Cirrhosis or Bilirubin >2x Normal or AST/ALT/AP >3x Normal) No  Stroke No  Bleeding Yes  Labile INR (Unstable/high INR) No  Elderly (>65) Yes  Drugs or alcohol (? 8 drinks/week, anti-plt or NSAID) No    CHA2DS2-VASc Score = 6  The patient's score is based upon: CHF History: 1 HTN History: 1 Diabetes History: 0 Stroke History: 2 (DVT/PE post op) Vascular Disease History: 0 Age Score: 2 Gender Score: 0   Physical Exam:    VS:  BP 110/74   Pulse (!) 58   Ht 6' (1.829 m)   Wt 215 lb 6.4 oz (97.7 kg)   SpO2 97%   BMI 29.21 kg/m     Wt Readings from Last 3 Encounters:  05/27/22 215 lb 6.4 oz (97.7 kg)  03/20/22 219 lb 3.2 oz (99.4 kg)  03/18/22 218 lb (98.9 kg)    General: Well developed, well nourished, NAD Lungs:Clear to  ausculation bilaterally. No wheezes, rales, or rhonchi. Breathing is unlabored. Cardiovascular: RRR with S1 S2. No murmur Extremities: No edema.  Neuro: Alert and oriented. No focal deficits. No facial asymmetry. MAE spontaneously. Psych: Responds to questions appropriately with normal affect.    ASSESSMENT/PLAN:    Persistent atrial fibrillation: s/p multiple ablations, last 02/29/2020. Maintaining SR/SB on EKG today with no c/o palpations. Discussed the importance of remaining consistent with Xarelto leading up to and after LAAO closure. Instruction letter reviewed with all questions answered. CHG soap given. Obtain CBC, BMET.     Obstructive sleep apnea Patient reports compliance with CPAP therapy.   NICM: w/ EF normalization to 50-55% on echo. Continues to do well with NYHA class I symptoms. Appears euvolemic on exam today. No changes at this time.   HTN: Stable today. No changes needed.    Medication Adjustments/Labs and Tests Ordered: Current medicines are reviewed at length with the patient today.  Concerns regarding medicines are outlined above.  Orders Placed This Encounter  Procedures   Basic metabolic  panel   CBC   EKG 12-Lead   No orders of the defined types were placed in this encounter.   Patient Instructions  Medication Instructions:  Your physician recommends that you continue on your current medications as directed. Please refer to the Current Medication list given to you today.  *If you need a refill on your cardiac medications before your next appointment, please call your pharmacy*   Lab Work: TODAY: BMET, CBC If you have labs (blood work) drawn today and your tests are completely normal, you will receive your results only by: [place] (if you have MyChart) OR A paper copy in the mail If you have any lab test that is abnormal or we need to change your treatment, we will call you to review the results.   Testing/Procedures: SEE INSTRUCTION  LETTER   Follow-Up: At [facility], you and your health needs are our priority.  As part of our continuing mission to provide you with exceptional heart care, we have created designated Provider Care Teams.  These Care Teams include your primary Cardiologist (physician) and Advanced Practice Providers (APPs -  Physician Assistants and Nurse Practitioners) who all work together to provide you with the care you need, when you need it.  We recommend signing up for the patient portal called "MyChart".  Sign up information is provided on this After Visit Summary.  MyChart is used to connect with patients for Virtual Visits (Telemedicine).  Patients are able to view lab/test results, encounter notes, upcoming appointments, etc.  Non-urgent messages can be sent to your provider as well.   To learn more about what you can do with MyChart, go to [email or web address].    Your next appointment:   AFTER YOUR PROCEDURE YOU WILL BE DISCHARGE WITH DATES AND TIMES OF YOUR FOLLOW-UP   Signed, Kathyrn Drown, NP  05/27/2022 9:37 AM    [place]

## 2022-05-27 ENCOUNTER — Telehealth: Payer: Self-pay

## 2022-05-27 ENCOUNTER — Ambulatory Visit: Payer: Medicare PPO | Admitting: Physical Therapy

## 2022-05-27 ENCOUNTER — Ambulatory Visit: Payer: Medicare PPO | Attending: Cardiovascular Disease | Admitting: Cardiology

## 2022-05-27 VITALS — BP 110/74 | HR 58 | Ht 72.0 in | Wt 215.4 lb

## 2022-05-27 DIAGNOSIS — G4733 Obstructive sleep apnea (adult) (pediatric): Secondary | ICD-10-CM

## 2022-05-27 DIAGNOSIS — I10 Essential (primary) hypertension: Secondary | ICD-10-CM | POA: Diagnosis not present

## 2022-05-27 DIAGNOSIS — I480 Paroxysmal atrial fibrillation: Secondary | ICD-10-CM | POA: Diagnosis not present

## 2022-05-27 DIAGNOSIS — Z01818 Encounter for other preprocedural examination: Secondary | ICD-10-CM | POA: Diagnosis not present

## 2022-05-27 DIAGNOSIS — I428 Other cardiomyopathies: Secondary | ICD-10-CM

## 2022-05-27 LAB — BASIC METABOLIC PANEL
BUN/Creatinine Ratio: 21 (ref 10–24)
BUN: 18 mg/dL (ref 8–27)
CO2: 28 mmol/L (ref 20–29)
Calcium: 8.9 mg/dL (ref 8.6–10.2)
Chloride: 106 mmol/L (ref 96–106)
Creatinine, Ser: 0.86 mg/dL (ref 0.76–1.27)
Glucose: 113 mg/dL — ABNORMAL HIGH (ref 70–99)
Potassium: 4.6 mmol/L (ref 3.5–5.2)
Sodium: 142 mmol/L (ref 134–144)
eGFR: 84 mL/min/{1.73_m2} (ref >59)

## 2022-05-27 LAB — CBC
Hematocrit: 37.7 % (ref 37.5–51.0)
Hemoglobin: 12.4 g/dL — ABNORMAL LOW (ref 13.0–17.7)
MCH: 30.4 pg (ref 26.6–33.0)
MCHC: 32.9 g/dL (ref 31.5–35.7)
MCV: 92 fL (ref 79–97)
Platelets: 154 10*3/uL (ref 150–450)
RBC: 4.08 x10E6/uL — ABNORMAL LOW (ref 4.14–5.80)
RDW: 20.8 % — ABNORMAL HIGH (ref 11.6–15.4)
WBC: 5.5 10*3/uL (ref 3.4–10.8)

## 2022-05-27 NOTE — Patient Instructions (Signed)
Medication Instructions:  Your physician recommends that you continue on your current medications as directed. Please refer to the Current Medication list given to you today.  *If you need a refill on your cardiac medications before your next appointment, please call your pharmacy*   Lab Work: TODAY: BMET, CBC If you have labs (blood work) drawn today and your tests are completely normal, you will receive your results only by: [place] (if you have MyChart) OR A paper copy in the mail If you have any lab test that is abnormal or we need to change your treatment, we will call you to review the results.   Testing/Procedures: SEE INSTRUCTION LETTER   Follow-Up: At [facility], you and your health needs are our priority.  As part of our continuing mission to provide you with exceptional heart care, we have created designated Provider Care Teams.  These Care Teams include your primary Cardiologist (physician) and Advanced Practice Providers (APPs -  Physician Assistants and Nurse Practitioners) who all work together to provide you with the care you need, when you need it.  We recommend signing up for the patient portal called "MyChart".  Sign up information is provided on this After Visit Summary.  MyChart is used to connect with patients for Virtual Visits (Telemedicine).  Patients are able to view lab/test results, encounter notes, upcoming appointments, etc.  Non-urgent messages can be sent to your provider as well.   To learn more about what you can do with MyChart, go to [email or web address].    Your next appointment:   AFTER YOUR PROCEDURE YOU WILL BE DISCHARGE WITH DATES AND TIMES OF YOUR FOLLOW-UP

## 2022-05-28 NOTE — Progress Notes (Signed)
Patient will have surgery on Thursday, January 25th, at 09:45 o'clock with Dr. Quentin Ore. Patient will be at the hospital at 07:00 o'clock. Patient received pre-procedure instructions. Patient was instructed to call if he will have questions/concerns prior to procedure.

## 2022-05-30 ENCOUNTER — Encounter (HOSPITAL_COMMUNITY): Payer: Self-pay | Admitting: Cardiology

## 2022-05-30 ENCOUNTER — Other Ambulatory Visit: Payer: Self-pay

## 2022-05-30 ENCOUNTER — Inpatient Hospital Stay (HOSPITAL_COMMUNITY): Payer: Medicare PPO | Admitting: Certified Registered"

## 2022-05-30 ENCOUNTER — Inpatient Hospital Stay (HOSPITAL_COMMUNITY)
Admission: RE | Admit: 2022-05-30 | Discharge: 2022-05-30 | DRG: 274 | Disposition: A | Discharge status: Home / Self Care | Payer: Medicare PPO | Attending: Cardiology | Admitting: Cardiology

## 2022-05-30 ENCOUNTER — Inpatient Hospital Stay (HOSPITAL_COMMUNITY): Payer: Medicare PPO

## 2022-05-30 ENCOUNTER — Encounter (HOSPITAL_COMMUNITY)
Admission: RE | Disposition: A | Discharge status: Home / Self Care | Payer: Self-pay | Source: Home / Self Care | Attending: Cardiology

## 2022-05-30 ENCOUNTER — Ambulatory Visit: Payer: Medicare PPO | Admitting: Physical Therapy

## 2022-05-30 ENCOUNTER — Encounter: Payer: Self-pay | Admitting: Cardiology

## 2022-05-30 DIAGNOSIS — G4733 Obstructive sleep apnea (adult) (pediatric): Secondary | ICD-10-CM | POA: Diagnosis present

## 2022-05-30 DIAGNOSIS — I10 Essential (primary) hypertension: Secondary | ICD-10-CM

## 2022-05-30 DIAGNOSIS — I480 Paroxysmal atrial fibrillation: Secondary | ICD-10-CM | POA: Diagnosis not present

## 2022-05-30 DIAGNOSIS — I4891 Unspecified atrial fibrillation: Secondary | ICD-10-CM | POA: Diagnosis present

## 2022-05-30 DIAGNOSIS — Z96651 Presence of right artificial knee joint: Secondary | ICD-10-CM | POA: Diagnosis present

## 2022-05-30 DIAGNOSIS — Z961 Presence of intraocular lens: Secondary | ICD-10-CM | POA: Diagnosis present

## 2022-05-30 DIAGNOSIS — E785 Hyperlipidemia, unspecified: Secondary | ICD-10-CM | POA: Diagnosis present

## 2022-05-30 DIAGNOSIS — Z95818 Presence of other cardiac implants and grafts: Principal | ICD-10-CM

## 2022-05-30 DIAGNOSIS — R310 Gross hematuria: Secondary | ICD-10-CM | POA: Diagnosis present

## 2022-05-30 DIAGNOSIS — E7800 Pure hypercholesterolemia, unspecified: Secondary | ICD-10-CM | POA: Diagnosis present

## 2022-05-30 DIAGNOSIS — Z974 Presence of external hearing-aid: Secondary | ICD-10-CM | POA: Diagnosis not present

## 2022-05-30 DIAGNOSIS — K5909 Other constipation: Secondary | ICD-10-CM | POA: Diagnosis present

## 2022-05-30 DIAGNOSIS — I4892 Unspecified atrial flutter: Secondary | ICD-10-CM | POA: Diagnosis present

## 2022-05-30 DIAGNOSIS — I4510 Unspecified right bundle-branch block: Secondary | ICD-10-CM | POA: Diagnosis present

## 2022-05-30 DIAGNOSIS — Z8546 Personal history of malignant neoplasm of prostate: Secondary | ICD-10-CM | POA: Diagnosis not present

## 2022-05-30 DIAGNOSIS — I519 Heart disease, unspecified: Secondary | ICD-10-CM | POA: Diagnosis present

## 2022-05-30 DIAGNOSIS — M069 Rheumatoid arthritis, unspecified: Secondary | ICD-10-CM | POA: Diagnosis present

## 2022-05-30 DIAGNOSIS — Z9841 Cataract extraction status, right eye: Secondary | ICD-10-CM | POA: Diagnosis not present

## 2022-05-30 DIAGNOSIS — Z96641 Presence of right artificial hip joint: Secondary | ICD-10-CM | POA: Diagnosis present

## 2022-05-30 DIAGNOSIS — Z87442 Personal history of urinary calculi: Secondary | ICD-10-CM | POA: Diagnosis not present

## 2022-05-30 DIAGNOSIS — Z8711 Personal history of peptic ulcer disease: Secondary | ICD-10-CM | POA: Diagnosis not present

## 2022-05-30 DIAGNOSIS — D649 Anemia, unspecified: Secondary | ICD-10-CM | POA: Diagnosis not present

## 2022-05-30 DIAGNOSIS — Z87891 Personal history of nicotine dependence: Secondary | ICD-10-CM

## 2022-05-30 DIAGNOSIS — Z01818 Encounter for other preprocedural examination: Secondary | ICD-10-CM | POA: Diagnosis not present

## 2022-05-30 DIAGNOSIS — H3530 Unspecified macular degeneration: Secondary | ICD-10-CM | POA: Diagnosis present

## 2022-05-30 DIAGNOSIS — N400 Enlarged prostate without lower urinary tract symptoms: Secondary | ICD-10-CM | POA: Diagnosis present

## 2022-05-30 DIAGNOSIS — Z86718 Personal history of other venous thrombosis and embolism: Secondary | ICD-10-CM | POA: Diagnosis not present

## 2022-05-30 DIAGNOSIS — I4819 Other persistent atrial fibrillation: Secondary | ICD-10-CM | POA: Diagnosis not present

## 2022-05-30 DIAGNOSIS — I428 Other cardiomyopathies: Secondary | ICD-10-CM | POA: Diagnosis not present

## 2022-05-30 DIAGNOSIS — Z794 Long term (current) use of insulin: Secondary | ICD-10-CM

## 2022-05-30 DIAGNOSIS — Z79899 Other long term (current) drug therapy: Secondary | ICD-10-CM

## 2022-05-30 DIAGNOSIS — E669 Obesity, unspecified: Secondary | ICD-10-CM | POA: Diagnosis present

## 2022-05-30 DIAGNOSIS — Z9989 Dependence on other enabling machines and devices: Secondary | ICD-10-CM | POA: Diagnosis not present

## 2022-05-30 DIAGNOSIS — Z8249 Family history of ischemic heart disease and other diseases of the circulatory system: Secondary | ICD-10-CM | POA: Diagnosis not present

## 2022-05-30 DIAGNOSIS — Z9842 Cataract extraction status, left eye: Secondary | ICD-10-CM

## 2022-05-30 DIAGNOSIS — Z006 Encounter for examination for normal comparison and control in clinical research program: Secondary | ICD-10-CM | POA: Diagnosis not present

## 2022-05-30 DIAGNOSIS — Z880 Allergy status to penicillin: Secondary | ICD-10-CM

## 2022-05-30 DIAGNOSIS — I088 Other rheumatic multiple valve diseases: Secondary | ICD-10-CM | POA: Diagnosis not present

## 2022-05-30 DIAGNOSIS — Z7901 Long term (current) use of anticoagulants: Secondary | ICD-10-CM

## 2022-05-30 HISTORY — PX: TEE WITHOUT CARDIOVERSION: SHX5443

## 2022-05-30 HISTORY — DX: Presence of other cardiac implants and grafts: Z95.818

## 2022-05-30 HISTORY — PX: LEFT ATRIAL APPENDAGE OCCLUSION: EP1229

## 2022-05-30 LAB — SURGICAL PCR SCREEN
MRSA, PCR: NEGATIVE
Staphylococcus aureus: NEGATIVE

## 2022-05-30 LAB — TYPE AND SCREEN
ABO/RH(D): O POS
Antibody Screen: NEGATIVE

## 2022-05-30 LAB — ECHO TEE

## 2022-05-30 LAB — POCT ACTIVATED CLOTTING TIME: Activated Clotting Time: 466 seconds

## 2022-05-30 SURGERY — LEFT ATRIAL APPENDAGE OCCLUSION
Anesthesia: General

## 2022-05-30 MED ORDER — HEPARIN (PORCINE) IN NACL 1000-0.9 UT/500ML-% IV SOLN
INTRAVENOUS | Status: AC
  Filled 2022-05-30: qty 500

## 2022-05-30 MED ORDER — LIDOCAINE 2% (20 MG/ML) 5 ML SYRINGE
INTRAMUSCULAR | Status: DC | PRN
  Administered 2022-05-30: 60 mg via INTRAVENOUS

## 2022-05-30 MED ORDER — LACTATED RINGERS IV SOLN: INTRAVENOUS | Status: DC

## 2022-05-30 MED ORDER — ONDANSETRON HCL 4 MG/2ML IJ SOLN
INTRAMUSCULAR | Status: DC | PRN
  Administered 2022-05-30: 4 mg via INTRAVENOUS

## 2022-05-30 MED ORDER — IOHEXOL 350 MG/ML SOLN
INTRAVENOUS | Status: DC | PRN
  Administered 2022-05-30: 30 mL

## 2022-05-30 MED ORDER — DEXAMETHASONE SODIUM PHOSPHATE 10 MG/ML IJ SOLN
INTRAMUSCULAR | Status: DC | PRN
  Administered 2022-05-30: 5 mg via INTRAVENOUS

## 2022-05-30 MED ORDER — ACETAMINOPHEN 325 MG PO TABS
650.0000 mg | ORAL_TABLET | ORAL | Status: DC | PRN
  Administered 2022-05-30: 650 mg via ORAL
  Filled 2022-05-30: qty 2

## 2022-05-30 MED ORDER — VANCOMYCIN HCL IN DEXTROSE 1-5 GM/200ML-% IV SOLN: 1000.0000 mg | INTRAVENOUS | Status: AC

## 2022-05-30 MED ORDER — ROCURONIUM BROMIDE 10 MG/ML (PF) SYRINGE
PREFILLED_SYRINGE | INTRAVENOUS | Status: DC | PRN
  Administered 2022-05-30: 20 mg via INTRAVENOUS
  Administered 2022-05-30: 50 mg via INTRAVENOUS

## 2022-05-30 MED ORDER — SODIUM CHLORIDE 0.9% FLUSH
3.0000 mL | Freq: Two times a day (BID) | INTRAVENOUS | Status: DC
  Administered 2022-05-30: 3 mL via INTRAVENOUS

## 2022-05-30 MED ORDER — PROPOFOL 10 MG/ML IV BOLUS
INTRAVENOUS | Status: DC | PRN
  Administered 2022-05-30: 110 mg via INTRAVENOUS

## 2022-05-30 MED ORDER — HEPARIN SODIUM (PORCINE) 1000 UNIT/ML IJ SOLN
INTRAMUSCULAR | Status: DC | PRN
  Administered 2022-05-30: 15000 [IU] via INTRAVENOUS

## 2022-05-30 MED ORDER — HEPARIN (PORCINE) IN NACL 2000-0.9 UNIT/L-% IV SOLN
INTRAVENOUS | Status: AC
  Filled 2022-05-30: qty 1000

## 2022-05-30 MED ORDER — EPHEDRINE SULFATE-NACL 50-0.9 MG/10ML-% IV SOSY
PREFILLED_SYRINGE | INTRAVENOUS | Status: DC | PRN
  Administered 2022-05-30: 10 mg via INTRAVENOUS
  Administered 2022-05-30: 5 mg via INTRAVENOUS

## 2022-05-30 MED ORDER — SUGAMMADEX SODIUM 200 MG/2ML IV SOLN
INTRAVENOUS | Status: DC | PRN
  Administered 2022-05-30: 200 mg via INTRAVENOUS

## 2022-05-30 MED ORDER — ONDANSETRON HCL 4 MG/2ML IJ SOLN: 4.0000 mg | Freq: Four times a day (QID) | INTRAMUSCULAR | Status: DC | PRN

## 2022-05-30 MED ORDER — CHLORHEXIDINE GLUCONATE 4 % EX LIQD
Freq: Once | CUTANEOUS | Status: DC
  Filled 2022-05-30: qty 15

## 2022-05-30 MED ORDER — SODIUM CHLORIDE 0.9 % IV SOLN: INTRAVENOUS | Status: DC

## 2022-05-30 MED ORDER — VANCOMYCIN HCL IN DEXTROSE 1-5 GM/200ML-% IV SOLN
INTRAVENOUS | Status: AC
  Administered 2022-05-30: 1000 mg via INTRAVENOUS
  Filled 2022-05-30: qty 200

## 2022-05-30 MED ORDER — CHLORHEXIDINE GLUCONATE 0.12 % MT SOLN
OROMUCOSAL | Status: AC
  Administered 2022-05-30: 15 mL via OROMUCOSAL
  Filled 2022-05-30: qty 15

## 2022-05-30 MED ORDER — LACTATED RINGERS IV SOLN: INTRAVENOUS | Status: DC | PRN

## 2022-05-30 MED ORDER — FENTANYL CITRATE (PF) 100 MCG/2ML IJ SOLN
INTRAMUSCULAR | Status: DC | PRN
  Administered 2022-05-30 (×2): 50 ug via INTRAVENOUS

## 2022-05-30 MED ORDER — HEPARIN (PORCINE) IN NACL 2000-0.9 UNIT/L-% IV SOLN
INTRAVENOUS | Status: DC | PRN
  Administered 2022-05-30: 1000 mL

## 2022-05-30 MED ORDER — SODIUM CHLORIDE 0.9% FLUSH: 3.0000 mL | INTRAVENOUS | Status: DC | PRN

## 2022-05-30 MED ORDER — SODIUM CHLORIDE 0.9 % IV SOLN: 250.0000 mL | INTRAVENOUS | Status: DC | PRN

## 2022-05-30 MED ORDER — HEPARIN (PORCINE) IN NACL 1000-0.9 UT/500ML-% IV SOLN
INTRAVENOUS | Status: DC | PRN
  Administered 2022-05-30: 500 mL

## 2022-05-30 MED ORDER — CHLORHEXIDINE GLUCONATE 0.12 % MT SOLN: 15.0000 mL | OROMUCOSAL | Status: AC

## 2022-05-30 MED ORDER — PHENYLEPHRINE HCL-NACL 20-0.9 MG/250ML-% IV SOLN
INTRAVENOUS | Status: DC | PRN
  Administered 2022-05-30: 25 ug/min via INTRAVENOUS

## 2022-05-30 MED ORDER — PROTAMINE SULFATE 10 MG/ML IV SOLN
INTRAVENOUS | Status: DC | PRN
  Administered 2022-05-30: 20 mg via INTRAVENOUS
  Administered 2022-05-30: 35 mg via INTRAVENOUS

## 2022-05-30 SURGICAL SUPPLY — 18 items
CATH DIAG 6FR PIGTAIL ANGLED (CATHETERS) IMPLANT
CLOSURE PERCLOSE PROSTYLE (VASCULAR PRODUCTS) IMPLANT
DEVICE WATCHMAN FLX PROC (KITS) IMPLANT
DILATOR VESSEL 38 20CM 11FR (INTRODUCER) IMPLANT
KIT HEART LEFT (KITS) ×1 IMPLANT
KIT SHEA VERSACROSS LAAC CONNE (KITS) IMPLANT
PACK CARDIAC CATHETERIZATION (CUSTOM PROCEDURE TRAY) ×1 IMPLANT
PAD DEFIB RADIO PHYSIO CONN (PAD) ×1 IMPLANT
SHEATH PERFORMER 16FR 30 (SHEATH) IMPLANT
SHEATH PINNACLE 8F 10CM (SHEATH) IMPLANT
SHEATH PROBE COVER 6X72 (BAG) ×1 IMPLANT
SYS WATCHMAN FXD DBL (SHEATH) ×1
SYSTEM WATCHMAN FXD DBL (SHEATH) IMPLANT
TRANSDUCER W/STOPCOCK (MISCELLANEOUS) ×1 IMPLANT
TUBING CIL FLEX 10 FLL-RA (TUBING) ×1 IMPLANT
WATCHMAN FLX 24 (Prosthesis & Implant Heart) IMPLANT
WATCHMAN FLX PROCEDURE DEVICE (KITS) ×1 IMPLANT
WATCHMAN PROCED TRUSEAL ACCESS (SHEATH) IMPLANT

## 2022-05-30 NOTE — Anesthesia Preprocedure Evaluation (Addendum)
Anesthesia Evaluation  Patient identified by MRN, date of birth, ID band Patient awake    Reviewed: Allergy & Precautions, NPO status , Patient's Chart, lab work & pertinent test results  Airway Mallampati: II  TM Distance: >3 FB Neck ROM: Full    Dental no notable dental hx.    Pulmonary sleep apnea and Continuous Positive Airway Pressure Ventilation , former smoker   Pulmonary exam normal        Cardiovascular hypertension, Pt. on medications and Pt. on home beta blockers + DVT  + dysrhythmias Atrial Fibrillation  Rhythm:Irregular Rate:Normal  ECHO 12/23:  1. Left ventricular ejection fraction, by estimation, is 50 to 55%. The  left ventricle has low normal function. The left ventricle has no regional  wall motion abnormalities. Left ventricular diastolic parameters are  consistent with Grade II diastolic  dysfunction (pseudonormalization).   2. Right ventricular systolic function is normal. The right ventricular  size is moderately enlarged. There is normal pulmonary artery systolic  pressure. The estimated right ventricular systolic pressure is 94.5 mmHg.   3. Left atrial size was moderately dilated.   4. Right atrial size was mild to moderately dilated.   5. The mitral valve is grossly normal. Mild mitral valve regurgitation.  No evidence of mitral stenosis.   6. The aortic valve is tricuspid. Aortic valve regurgitation is mild. No  aortic stenosis is present.   7. Aortic dilatation noted. There is mild dilatation of the aortic root,  measuring 41 mm.   8. The inferior vena cava is normal in size with greater than 50%  respiratory variability, suggesting right atrial pressure of 3 mmHg.   Comparison(s): No significant change from prior study.     Neuro/Psych negative neurological ROS  negative psych ROS   GI/Hepatic Neg liver ROS,GERD  ,,  Endo/Other  negative endocrine ROS    Renal/GU negative Renal ROS   negative genitourinary   Musculoskeletal  (+) Arthritis , Rheumatoid disorders,    Abdominal Normal abdominal exam  (+)   Peds  Hematology negative hematology ROS (+)   Anesthesia Other Findings   Reproductive/Obstetrics                             Anesthesia Physical Anesthesia Plan  ASA: 3  Anesthesia Plan: General   Post-op Pain Management:    Induction: Intravenous  PONV Risk Score and Plan: 2 and Ondansetron, Dexamethasone and Treatment may vary due to age or medical condition  Airway Management Planned: Mask and Oral ETT  Additional Equipment: ClearSight  Intra-op Plan:   Post-operative Plan: Extubation in OR  Informed Consent: I have reviewed the patients History and Physical, chart, labs and discussed the procedure including the risks, benefits and alternatives for the proposed anesthesia with the patient or authorized representative who has indicated his/her understanding and acceptance.     Dental advisory given  Plan Discussed with: CRNA  Anesthesia Plan Comments:        Anesthesia Quick Evaluation

## 2022-05-30 NOTE — Transfer of Care (Signed)
Immediate Anesthesia Transfer of Care Note  Patient: Connor [name]  Procedure(s) Performed: LEFT ATRIAL APPENDAGE OCCLUSION TRANSESOPHAGEAL ECHOCARDIOGRAM (TEE)  Patient Location: Cath Lab  Anesthesia Type:General  Level of Consciousness: awake, alert , and oriented  Airway & Oxygen Therapy: Patient Spontanous Breathing and Patient connected to nasal cannula oxygen  Post-op Assessment: Report given to RN  Post vital signs: Reviewed and stable  Last Vitals:  Vitals Value Taken Time  BP 122/68 05/30/22 1126  Temp    Pulse 72 05/30/22 1129  Resp 13 05/30/22 1129  SpO2 95 % 05/30/22 1129  Vitals shown include unvalidated device data.  Last Pain:  Vitals:   05/30/22 1110  TempSrc:   PainSc: 0-No pain      Patients Stated Pain Goal: 0 (85/02/77 4128)  Complications: There were no known notable events for this encounter.

## 2022-05-30 NOTE — Discharge Summary (Signed)
[facility]    Patient ID: Connor [name],  MRN: [id number], DOB/AGE: [birth year] 89 y.o.  Admit date: 05/30/2022 Discharge date: 05/30/2022  Primary Care Physician: Connor Mclean, MD  Primary Cardiologist: Connor Ruths, MD  Electrophysiologist: Connor Epley, MD  Primary Discharge Diagnosis:  Persistent Atrial Fibrillation Poor candidacy for long term anticoagulation due to  hematuria and anemia  Secondary Discharge Diagnosis:  -Prostate CA -Provoked DVT (2012) -PVCs and RBBB -A Flutter Ablation x 1 -A Fib Ablation x 3 -HTN -HLD -OSA on CPAP -NICM  Procedures This Admission:  Transeptal Puncture Intra-procedural TEE which showed no LAA thrombus Left atrial appendage occlusive device placement on 05/30/22 by Dr. Quentin [name].   Brief HPI: Connor [name] is a 89 y.o. male with a history of prostate CA, provoked DVT (2012), PVCs and RBBB, A Flutter Ablation x 1, A Fib Ablation x 3, HTN, HLD, OSA on CPAP, obesity, NICM, PAF with hematuria and anemia who presents today for pre-op evaluation prior to [place] closure with Watchman.    [name] has been followed for his atrial fibrillation [facility] and was seen 01/2022 at which time he reported a several week hx of hematuria. Hb went from 12.7 in 12/2021 to 9.2. He underwent bladder cauterization with symptom improvement. Given trouble with hematuria, he was referred to Dr. Quentin [name] for consideration of LAAO closure with Watchman in hopes to avoid long term anticoagulation.   He was seen by Dr. Quentin [name] 03/20/2022. CT imaging showed anatomy suitable for device implant and no thrombus.    In pre Watchman evaluation, he reported that he would intermittently skipping his anticoagulation due to recurrent hematuria. He reported that he had been consistent for the last 4 days. We discussed the importance of AC consistency.   Hospital Course:  The patient was admitted and underwent left atrial appendage  occlusive device placement with Watchman FLX 51m device. Groin site has been without bleeding or hematoma. Given his stability, he is being considered for same day discharge. Wound care and restrictions were reviewed with the patient. The patient has been scheduled for post procedure follow up with JKathyrn Drown NP in approximately 1 month. Medication plan will be resume home dose Xarelto this evening and continue this for 6 weeks post implant at which time he will transition to ASA and Plavix for a 6 month duration. A repeat CT at approximately 60 days will be performed to ensure proper seal of the device. He will require 6 months of dental SBE. Given PCN allergy, will RX azithromycin at follow up. He should refrain from dental procedures for the next 4 weeks.     Obstructive sleep apnea: Patient reports compliance with CPAP therapy.   NICM: w/ EF normalization to 50-55% on echocardiogram. Continues to do well with NYHA class I symptoms. Appears euvolemic on exam today. No changes at this time.   HTN: Stable today with no changes needed at this time.   Physical Exam: Vitals:   05/30/22 1400 05/30/22 1415 05/30/22 1430 05/30/22 1445  BP: 120/68 124/74 133/84 122/73  Pulse: 79 77 80 71  Resp: '17 16 17 19  '$ Temp:      TempSrc:      SpO2: 94% 95% 95% 91%  Weight:      Height:       General: Well developed, well nourished, NAD Lungs:Clear to ausculation bilaterally. No wheezes, rales, or rhonchi. Breathing is unlabored. Cardiovascular: RRR with S1  S2. No murmurs Extremities: No edema. Groin site level 0.  Neuro: Alert and oriented. No focal deficits. No facial asymmetry. MAE spontaneously. Psych: Responds to questions appropriately with normal affect.    Labs:   Lab Results  Component Value Date   WBC 5.5 05/27/2022   HGB 12.4 (L) 05/27/2022   HCT 37.7 05/27/2022   MCV 92 05/27/2022   PLT 154 05/27/2022    Recent Labs  Lab 05/27/22 0900  NA 142  K 4.6  CL 106  CO2 28  BUN 18   CREATININE 0.86  CALCIUM 8.9  GLUCOSE 113*   Discharge Medications:  Allergies as of 05/30/2022       Reactions   Penicillins Itching, Swelling, Other (See Comments)   Did it involve swelling of the face/tongue/throat, SOB, or low BP? No Did it involve sudden or severe rash/hives, skin peeling, or any reaction on the inside of your mouth or nose? No Did you need to seek medical attention at a hospital or doctor's office? No When did it last happen?      30 + years If all above answers are "NO", may proceed with cephalosporin use.        Medication List     TAKE these medications    acetaminophen 500 MG tablet Commonly known as: TYLENOL Take 1,000 mg by mouth 2 (two) times daily as needed for moderate pain.   atorvastatin 10 MG tablet Commonly known as: LIPITOR TAKE 1 TABLET BY MOUTH EVERY DAY What changed: when to take this   Cranberry 1000 MG Caps Take 4,000 mg by mouth 2 (two) times daily.   finasteride 5 MG tablet Commonly known as: PROSCAR Take 5 mg by mouth every morning.   Humira (2 Pen) 40 MG/0.4ML Pnkt Generic drug: Adalimumab Inject 40 mg as directed every 7 (seven) days. Every Tuesday   lisinopril 40 MG tablet Commonly known as: ZESTRIL TAKE 1 TABLET BY MOUTH EVERY DAY   Melatonin 5 MG Chew Chew 15 Pieces of gum by mouth at bedtime. gummy   metoprolol succinate 50 MG 24 hr tablet Commonly known as: TOPROL-XL Take 1 tablet (50 mg total) by mouth in the morning and at bedtime. Take with or immediately following a meal.   omeprazole 20 MG capsule Commonly known as: PRILOSEC Take 20 mg by mouth daily.   PreserVision AREDS 2 Caps Take 1 capsule by mouth 2 (two) times a day.   rivaroxaban 20 MG Tabs tablet Commonly known as: XARELTO Take 20 mg by mouth every evening. Notes to patient: Resume this evening, 05/30/22 after discharge home    senna 8.6 MG Tabs tablet Commonly known as: SENOKOT Take 2 tablets by mouth at bedtime. Per patient taking 2  tablets daily   solifenacin 10 MG tablet Commonly known as: VESICARE Take 10 mg by mouth at bedtime.   tamsulosin 0.4 MG Caps capsule Commonly known as: Flomax Take 1 capsule (0.4 mg total) by mouth daily after supper. What changed: when to take this   Testosterone 20.25 MG/ACT (1.62%) Gel Apply 1 Pump topically at bedtime. Apply to shoulders   THERATEARS OP Apply 1 drop to eye daily as needed (dry eyes).   vitamin B-12 500 MCG tablet Commonly known as: CYANOCOBALAMIN Take 500 mcg by mouth every evening.   Vitamin D3 125 MCG (5000 UT) Tabs Take 5,000 Units by mouth every morning.       Disposition:  Home  Discharge Instructions     Call MD for:  difficulty breathing, headache or visual disturbances   Complete by: As directed    Call MD for:  extreme fatigue   Complete by: As directed    Call MD for:  hives   Complete by: As directed    Call MD for:  persistant dizziness or light-headedness   Complete by: As directed    Call MD for:  persistant nausea and vomiting   Complete by: As directed    Call MD for:  redness, tenderness, or signs of infection (pain, swelling, redness, odor or green/yellow discharge around incision site)   Complete by: As directed    Call MD for:  severe uncontrolled pain   Complete by: As directed    Call MD for:  temperature >100.4   Complete by: As directed    Diet - low sodium heart healthy   Complete by: As directed    Discharge instructions   Complete by: As directed    [facility] Procedure, Care After  Procedure MD: Dr. Benson Norway Clinical Coordinator: Lenice Llamas, RN  This sheet gives you information about how to care for yourself after your procedure. Your health care provider may also give you more specific instructions. If you have problems or questions, contact your health care provider.  What can I expect after the procedure? After the procedure, it is common to have: Bruising around your puncture site. Tenderness  around your puncture site. Tiredness (fatigue).  Medication instructions It is very important to continue to take your blood thinner as directed by your doctor after the Watchman procedure. Call your procedure doctor's office with question or concerns. If you are on Coumadin (warfarin), you will have your INR checked the week after your procedure, with a goal INR of 2.0 - 3.0. Please follow your medication instructions on your discharge summary. Only take the medications listed on your discharge paperwork.  Follow up You will be seen in 1 month after your procedure You will have a repeat CT scan approximately 8 weeks after your procedure mark to check your device You will follow up the MD/APP who performed your procedure 6 months after your procedure The Watchman Clinical Coordinator will check in with you from time to time, including 1 and 2 years after your procedure.    Follow these instructions at home: Puncture site care  Follow instructions from your health care provider about how to take care of your puncture site. Make sure you: If present, leave stitches (sutures), skin glue, or adhesive strips in place.  If a large square bandage is present, this may be removed 24 hours after surgery.  Check your puncture site every day for signs of infection. Check for: Redness, swelling, or pain. Fluid or blood. If your puncture site starts to bleed, lie down on your back, apply firm pressure to the area, and contact your health care provider. Warmth. Pus or a bad smell. Driving Do not drive yourself home if you received sedation Do not drive for at least 4 days after your procedure or however long your health care provider recommends. (Do not resume driving if you have previously been instructed not to drive for other health reasons.) Do not spend greater than 1 hour at a time in a car for the first 3 days. Stop and take a break with a 5 minute walk at least every hour.  Do not drive or use  heavy machinery while taking prescription pain medicine.  Activity Avoid activities that take a lot of effort, including exercise,  for at least 7 days after your procedure. For the first 3 days, avoid sitting for longer than one hour at a time.  Avoid alcoholic beverages, signing paperwork, or participating in legal proceedings for 24 hours after receiving sedation Do not lift anything that is heavier than 10 lb (4.5 kg) for one week.  No sexual activity for 1 week.  Return to your normal activities as told by your health care provider. Ask your health care provider what activities are safe for you. General instructions Take over-the-counter and prescription medicines only as told by your health care provider. Do not use any products that contain nicotine or tobacco, such as cigarettes and e-cigarettes. If you need help quitting, ask your health care provider. You may shower after 24 hours, but Do not take baths, swim, or use a hot tub for 1 week.  Do not drink alcohol for 24 hours after your procedure. Keep all follow-up visits as told by your health care provider. This is important. Dental Work: You will require antibiotics prior to any dental work, including cleanings, for 6 months after your Watchman implantation to help protect you from infection. After 6 months, antibiotics are no longer required. Contact a health care provider if: You have redness, mild swelling, or pain around your puncture site. You have soreness in your throat or at your puncture site that does not improve after several days You have fluid or blood coming from your puncture site that stops after applying firm pressure to the area. Your puncture site feels warm to the touch. You have pus or a bad smell coming from your puncture site. You have a fever. You have chest pain or discomfort that spreads to your neck, jaw, or arm. You are sweating a lot. You feel nauseous. You have a fast or irregular heartbeat. You have  shortness of breath. You are dizzy or light-headed and feel the need to lie down. You have pain or numbness in the arm or leg closest to your puncture site. Get help right away if: Your puncture site suddenly swells. Your puncture site is bleeding and the bleeding does not stop after applying firm pressure to the area. These symptoms may represent a serious problem that is an emergency. Do not wait to see if the symptoms will go away. Get medical help right away. Call your local emergency services (911 in the [place] Do not drive yourself to the hospital. Summary After the procedure, it is normal to have bruising and tenderness at the puncture site in your groin, neck, or forearm. Check your puncture site every day for signs of infection. Get help right away if your puncture site is bleeding and the bleeding does not stop after applying firm pressure to the area. This is a medical emergency.  This information is not intended to replace advice given to you by your health care provider. Make sure you discuss any questions you have with your health care provider.   Increase activity slowly   Complete by: As directed        Follow-up Information     Tommie Raymond, NP Follow up on 07/01/2022.   Specialty: Cardiology Why: @ 1015am. Please arrive at 10am. Contact information: [place] [address] [phone]                Duration of Discharge Encounter: Greater than 30 minutes including physician time.  Signed, Connor Drown, NP  05/30/2022 3:22 PM

## 2022-05-30 NOTE — Progress Notes (Signed)
  Echocardiogram Echocardiogram Transesophageal has been performed.  Connor [name] M 05/30/2022, 11:08 AM

## 2022-05-30 NOTE — Anesthesia Procedure Notes (Signed)
Procedure Name: Intubation Date/Time: 05/30/2022 9:46 AM  Performed by: Barrington Ellison, CRNAPre-anesthesia Checklist: Patient identified, Emergency Drugs available, Suction available and Patient being monitored Patient Re-evaluated:Patient Re-evaluated prior to induction Oxygen Delivery Method: Circle System Utilized Preoxygenation: Pre-oxygenation with 100% oxygen Induction Type: IV induction Ventilation: Mask ventilation without difficulty Laryngoscope Size: Mac and 4 Grade View: Grade I Tube type: Oral Tube size: 7.5 mm Number of attempts: 1 Airway Equipment and Method: Stylet and Oral airway Placement Confirmation: ETT inserted through vocal cords under direct vision, positive ETCO2 and breath sounds checked- equal and bilateral Secured at: 22 cm Tube secured with: Tape Dental Injury: Teeth and Oropharynx as per pre-operative assessment

## 2022-05-30 NOTE — Progress Notes (Signed)
  [facility] TEAM  Patient doing well s/p LAAO with Watchman. He is hemodynamically stable. Groin site is stable. Post implant instructions reviewed with plan for early ambulation after bedrest completed and hopeful discharge later today.    Kathyrn Drown NP-C Structural Heart Team  Pager: [phone] Phone: [phone]

## 2022-05-30 NOTE — Anesthesia Postprocedure Evaluation (Signed)
Anesthesia Post Note  Patient: Connor [name]  Procedure(s) Performed: LEFT ATRIAL APPENDAGE OCCLUSION TRANSESOPHAGEAL ECHOCARDIOGRAM (TEE)     Patient location during evaluation: PACU Anesthesia Type: General Level of consciousness: awake and alert Pain management: pain level controlled Vital Signs Assessment: post-procedure vital signs reviewed and stable Respiratory status: spontaneous breathing, nonlabored ventilation, respiratory function stable and patient connected to nasal cannula oxygen Cardiovascular status: blood pressure returned to baseline and stable Postop Assessment: no apparent nausea or vomiting Anesthetic complications: no   There were no known notable events for this encounter.  Last Vitals:  Vitals:   05/30/22 1430 05/30/22 1445  BP: 133/84 122/73  Pulse: 80 71  Resp: 17 19  Temp:    SpO2: 95% 91%    Last Pain:  Vitals:   05/30/22 1245  TempSrc: Oral  PainSc:                  March Rummage Connor [name]

## 2022-05-30 NOTE — H&P (Addendum)
Electrophysiology Office Note:     Date:  05/30/2022    ID:  Connor [name], DOB 10/16/1933, MRN [id number]   PCP:  Darreld Mclean, MD      [facility] Cardiologist:  Kirk Ruths, MD  [facility] Electrophysiologist:  Vickie Epley, MD    Referring MD: Darreld Mclean, MD    Chief Complaint: Watchman consult   History of Present Illness:     Connor [name] is a 89 y.o. male who presents for a watchman consult at the request of Roderic Palau. Their medical history includes cancer, provoked DVT, PVCs, and RBBB.   Today,   He has an ablation for his flutter, which was successful. He then had 3 more ablations for his afib. The most recent one was in October of 2021.   He was bleeding in his urine recently, and his hemoglobin was very low. He had the problem areas cauterized and is doing much better. His blood count is now normal    He is on a blood thinner again, xarelto.   He doesn't exercise, but he still does his own yard work. He experiences mild fatigue.   He denies any palpitations, chest pain, shortness of breath, or peripheral edema. No lightheadedness, headaches, syncope, orthopnea, or PND.   Presents for LAAO. Procedure reviewed.    Objective      Past Medical History:  Diagnosis Date   Benign localized prostatic hyperplasia with lower urinary tract symptoms (LUTS)      urologist-- dr Diona Fanti   Cancer [facility])      prostate   Chronic anticoagulation      Xarelto for afib   Chronic constipation     Chronic cystitis     Gross hematuria     History of DVT of lower extremity 06/08/2010    right femoral dvt dx post op total hip 05-08-2010   History of gastric ulcer      remote   History of kidney stones      one stone. remote hx   Hypercholesteremia     Hyperlipidemia     Hypertension      Dx by Dr. Lance Muss around age  38    Macular degeneration      dry   NICM (nonischemic cardiomyopathy) ([place])      per echo 06-11-2017,  ef  45-50% with diffuse hypokinesis,  G1DD   OSA on CPAP 2017    compliant with CPAP.  managed by Dr Elsworth Soho.   Osteoarthritis     Other urethral stricture, male, meatal      chronic;  pt self dilates   Paroxysmal atrial fibrillation [facility]) cardiologist-- dr Rayann Heman    first dx 2009/   a. documented by Dr Barrett Shell on office EKG 9/12. b. maintained on Tikosyn and Xarelto. c. s/p DCCV 's.  d.  x3  EP w/ ablation atrial fib last one 12-05-2016   Prediabetes     Premature ventricular contraction     PVC's (premature ventricular contractions)     RA (rheumatoid arthritis) ([place])      rheumatologist--- Dr Page Spiro,  treated with Humira   RBBB (right bundle branch block)      Hx of a fib, ablation Aug 2018   Rheumatoid arthritis ([place])     Vitamin D deficiency     Wears hearing aid in both ears             Past Surgical History:  Procedure Laterality  Date   ABLATION OF DYSRHYTHMIC FOCUS   08/29/2015   ATRIAL FIBRILLATION ABLATION   12/05/2016   ATRIAL FIBRILLATION ABLATION N/A 12/05/2016    Procedure: Atrial Fibrillation Ablation;  Surgeon: [name], MD;  Location: [place] [facility];  Service: Cardiovascular;  Laterality: N/A;   ATRIAL FIBRILLATION ABLATION N/A 02/29/2020    Procedure: ATRIAL FIBRILLATION ABLATION;  Surgeon: [name], MD;  Location: [place] [facility];  Service: Cardiovascular;  Laterality: N/A;   ATRIAL FLUTTER ABLATION   09/2010    by [place]ford Bayley Right 1990s   CARDIOVERSION N/A 10/28/2012    Procedure: CARDIOVERSION;  Surgeon: Burnell Blanks, MD;  Location: [place];  Service: Cardiovascular;  Laterality: N/A;   CATARACT EXTRACTION W/ INTRAOCULAR LENS  IMPLANT, BILATERAL   2019   CYSTOSCOPY WITH BIOPSY N/A 09/03/2018    Procedure: CYSTOSCOPY WITH FULGURATION Fran Lowes;  Surgeon: Franchot Gallo, MD;  Location: [facility];  Service: Urology;  Laterality: N/A;  [place] N/A 01/23/2022    Procedure: CYSTOSCOPY WITH FULGERATION OF  PROSTATIC URETHRA;  Surgeon: Franchot Gallo, MD;  Location: [facility];  Service: Urology;  Laterality: N/A;  [place] N/A 08/29/2015    Procedure: Atrial Fibrillation Ablation;  Surgeon: [name], MD;  Location: [place] [facility];  Service: Cardiovascular;  Laterality: N/A;   KNEE SURGERY Bilateral x3   1960s & 1970s    open   LEFT HEART CATHETERIZATION WITH CORONARY ANGIOGRAM N/A 06/07/2011    Procedure: LEFT HEART CATHETERIZATION WITH CORONARY ANGIOGRAM;  Surgeon: Peter M Martinique, MD;  Location: [facility] CATH LAB;  Service: Cardiovascular;  Laterality: N/A;    Normal coronary arteries,  low normal LVF   TONSILLECTOMY AND ADENOIDECTOMY   age 59   TOTAL HIP ARTHROPLASTY Right 06/04/10    dr Noemi Chapel  '@[facility]'$    TOTAL KNEE ARTHROPLASTY Bilateral right 1995;  left 1997   TRANSURETHRAL RESECTION OF PROSTATE   06-29-2002   dr humphries  '@[facility]'$    TRANSURETHRAL RESECTION OF PROSTATE N/A 07/06/2018    Procedure: TRANSURETHRAL RESECTION OF THE PROSTATE (TURP);  Surgeon: Franchot Gallo, MD;  Location: [facility];  Service: Urology;  Laterality: N/A;  45 MINS      Current Medications: Active Medications      Current Meds  Medication Sig   acetaminophen (TYLENOL) 500 MG tablet Take 1,000 mg by mouth 2 (two) times daily as needed for moderate pain.   atorvastatin (LIPITOR) 10 MG tablet TAKE 1 TABLET BY MOUTH EVERY DAY   calcium carbonate (TUMS - DOSED IN MG ELEMENTAL CALCIUM) 500 MG chewable tablet Chew 1 tablet by mouth daily as needed for heartburn.   Carboxymethylcellulose Sodium (THERATEARS OP) Apply 1 drop to eye daily as needed (dry eyes).   Cholecalciferol (VITAMIN D3) 5000 UNITS TABS Take 5,000 Units by mouth daily.   Cranberry 1000 MG CAPS Take 3,000 mg by mouth at bedtime.   Docusate Calcium (STOOL SOFTENER PO) Take 4 tablets by mouth at bedtime.   finasteride (PROSCAR) 5 MG tablet Take 5 mg by mouth every morning.    HUMIRA PEN 40  MG/0.4ML PNKT Inject 40 mg as directed every 7 (seven) days. Every Tuesday   levofloxacin (LEVAQUIN) 500 MG tablet Take 500 mg by mouth daily.   lisinopril (ZESTRIL) 40 MG tablet TAKE 1 TABLET BY MOUTH EVERY DAY   metoprolol succinate (TOPROL-XL) 50 MG 24 hr tablet  Take 1 tablet (50 mg total) by mouth in the morning and at bedtime. Take with or immediately following a meal.   Multiple Vitamins-Minerals (PRESERVISION AREDS 2) CAPS Take 1 capsule by mouth 2 (two) times a day.   omeprazole (PRILOSEC) 20 MG capsule Take 20 mg by mouth daily.   rivaroxaban (XARELTO) 20 MG TABS tablet Take 20 mg by mouth daily with supper.   solifenacin (VESICARE) 10 MG tablet Take 10 mg by mouth at bedtime.    sulfamethoxazole-trimethoprim (BACTRIM DS) 800-160 MG tablet Take 1 tablet by mouth 2 (two) times daily.   tamsulosin (FLOMAX) 0.4 MG CAPS capsule Take 1 capsule (0.4 mg total) by mouth daily after supper.   Testosterone 20.25 MG/ACT (1.62%) GEL Apply 1 Pump topically at bedtime. Apply to shoulders   vitamin B-12 (CYANOCOBALAMIN) 500 MCG tablet Take 500 mcg by mouth every evening.         Allergies:   Penicillins    Social History         Socioeconomic History   Marital status: Married      Spouse name: Joycelyn Schmid   Number of children: Not on file   Years of education: Not on file   Highest education level: Not on file  Occupational History   Occupation: Retired  Tobacco Use   Smoking status: Former      Packs/day: 1.00      Years: 30.00      Total pack years: 30.00      Types: Cigarettes      Quit date: 05/06/1978      Years since quitting: 43.9   Smokeless tobacco: Never   Tobacco comments:      Former smoker 03/14/2021  Vaping Use   Vaping Use: Never used  Substance and Sexual Activity   Alcohol use: Yes      Alcohol/week: 1.0 standard drink of alcohol      Types: 1 Cans of beer per week      Comment: occasionally wine and beer   Drug use: Never   Sexual activity: Not on file  Other  Topics Concern   Not on file  Social History Narrative    he patient lives in [place] with his spouse.  Retired.    Social Determinants of Health        Financial Resource Strain: Low Risk  (06/21/2021)    Overall Financial Resource Strain (CARDIA)     Difficulty of Paying Living Expenses: Not hard at all  Food Insecurity: No Food Insecurity (01/08/2022)    Hunger Vital Sign     Worried About Running Out of Food in the Last Year: Never true     Ran Out of Food in the Last Year: Never true  Transportation Needs: No Transportation Needs (01/08/2022)    PRAPARE - Armed forces logistics/support/administrative officer (Medical): No     Lack of Transportation (Non-Medical): No  Physical Activity: Insufficiently Active (06/21/2021)    Exercise Vital Sign     Days of Exercise per Week: 2 days     Minutes of Exercise per Session: 50 min  Stress: No Stress Concern Present (06/08/2020)    [place]     Feeling of Stress : Not at all  Social Connections: Moderately Isolated (06/21/2021)    Social Connection and Isolation Panel [NHANES]     Frequency of Communication with Friends and Family: More than three times a week  Frequency of Social Gatherings with Friends and Family: More than three times a week     Attends Religious Services: Never     Marine scientist or Organizations: No     Attends Music therapist: Never     Marital Status: Married      Family History: The patient's family history includes Arrhythmia in his sister; Atrial fibrillation in his brother; Heart attack in his father; Lung disease in his mother. There is no history of Diabetes.   ROS:   Please see the history of present illness.    (+) Bleeding in urine   (+) Fatigue   All other systems reviewed and are negative.   EKGs/Labs/Other Studies Reviewed:     The following studies were reviewed today:       Recent Labs: 03/18/2022: ALT 14;  BUN 12; Creatinine, Ser 0.71; Hemoglobin 9.9; Platelets 173.0; Potassium 3.6; Sodium 142; TSH 1.30    Recent Lipid Panel Labs (Brief)          Component Value Date/Time    CHOL 156 03/12/2021 1004    TRIG 94.0 03/12/2021 1004    HDL 46.70 03/12/2021 1004    CHOLHDL 3 03/12/2021 1004    VLDL 18.8 03/12/2021 1004    LDLCALC 91 03/12/2021 1004        Physical Exam:     VS:  BP 130/70   Pulse 70   SpO2 94%   RR18      Wt Readings from Last 3 Encounters:  03/20/22 219 lb 3.2 oz (99.4 kg)  03/18/22 218 lb (98.9 kg)  01/30/22 219 lb 6.4 oz (99.5 kg)      GEN: Well nourished, well developed in no acute distress HEENT: Normal NECK: No JVD; No carotid bruits LYMPHATICS: No lymphadenopathy CARDIAC: RRR, no murmurs, rubs, gallops. Device pocket well healed. RESPIRATORY:  Clear to auscultation without rales, wheezing or rhonchi  ABDOMEN: Soft, non-tender, non-distended MUSCULOSKELETAL:  No edema; No deformity  SKIN: Warm and dry NEUROLOGIC:  Alert and oriented x 3 PSYCHIATRIC:  Normal affect          Assessment ASSESSMENT:     1. Paroxysmal atrial fibrillation (HCC)   2. Pre-op evaluation     PLAN:     In order of problems listed above:   #Persistent atrial fibrillation and flutter Doing well after his most recent ablation in 2021. He has experienced severe hematuria and presents today to discuss [place]. He is thankfully tolerating xarelto now after his recent urologic procedure.   ------------------   I have seen Connor [name] in the office today who is being considered for a Watchman left atrial appendage closure device. I believe they will benefit from this procedure given their history of atrial fibrillation, CHA2DS2-VASc score of 6 and unadjusted ischemic stroke rate of 9.7% per year. Unfortunately, the patient is not felt to be a long term anticoagulation candidate secondary to severe hematuria. The patient's chart has been reviewed and I feel that they would be  a candidate for short term oral anticoagulation after Watchman implant.    It is my belief that after undergoing a LAA closure procedure, Connor [name] will not need long term anticoagulation which eliminates anticoagulation side effects and major bleeding risk.    Procedural risks for the Watchman implant have been reviewed with the patient including a 0.5% risk of stroke, <1% risk of perforation and <1% risk of device embolization. Other risks include bleeding, vascular damage,  tamponade, worsening renal function, and death. The patient understands these risk and wishes to proceed.       The published clinical data on the safety and effectiveness of WATCHMAN include but are not limited to the following: - Holmes DR, Mechele Claude, Sick P et al. for the PROTECT AF Investigators. Percutaneous closure of the left atrial appendage versus warfarin therapy for prevention of stroke in patients with atrial fibrillation: a randomised non-inferiority trial. Lancet 2009; 374: 534-42. Mechele Claude, Doshi SK, Abelardo Diesel D et al. on behalf of the PROTECT AF Investigators. Percutaneous Left Atrial Appendage Closure for Stroke Prophylaxis in Patients With Atrial Fibrillation 2.3-Year Follow-up of the PROTECT AF (Watchman Left Atrial Appendage System for Embolic Protection in Patients With Atrial Fibrillation) Trial. Circulation 2013; 127:720-729. - Alli O, Doshi S,  Kar S, Reddy VY, Sievert H et al. Quality of Life Assessment in the Randomized PROTECT AF (Percutaneous Closure of the Left Atrial Appendage Versus Warfarin Therapy for Prevention of Stroke in Patients With Atrial Fibrillation) Trial of Patients at Risk for Stroke With Nonvalvular Atrial Fibrillation. J Am Coll Cardiol 2013; 40:9735-3. Vertell Limber DR, Tarri Abernethy, Price M, Crawford, Sievert H, Doshi S, Huber K, Reddy V. Prospective randomized evaluation of the Watchman left atrial appendage Device in patients with atrial fibrillation versus long-term  warfarin therapy; the PREVAIL trial. Journal of the [employer] of Cardiology, Vol. 4, No. 1, 2014, 1-11. - Kar S, Doshi SK, Sadhu A, Horton R, Osorio J et al. Primary outcome evaluation of a next-generation left atrial appendage closure device: results from the PINNACLE FLX trial. Circulation 2021;143(18)1754-1762.      After today's visit with the patient which was dedicated solely for shared decision making visit regarding LAA closure device, the patient decided to proceed with the LAA appendage closure procedure scheduled to be done in the near future at [facility]. Prior to the procedure, I would like to obtain a gated CT scan of the chest with contrast timed for PV/LA visualization.    HAS-BLED score 3 Hypertension Yes  Abnormal renal and liver function (Dialysis, transplant, Cr >2.26 mg/dL /Cirrhosis or Bilirubin >2x Normal or AST/ALT/AP >3x Normal) No  Stroke No  Bleeding Yes  Labile INR (Unstable/high INR) No  Elderly (>65) Yes  Drugs or alcohol (? 8 drinks/week, anti-plt or NSAID) No    CHA2DS2-VASc Score = 6  The patient's score is based upon: CHF History: 1 HTN History: 1 Diabetes History: 0 Stroke History: 2 (DVT/PE post op) Vascular Disease History: 0 Age Score: 2 Gender Score: 0      Presents today for LAAO. Procedure reviewed including the risks and he wishes to proceed.      Signed, Hilton Cork. Quentin Ore, MD, [facility], [facility] 05/30/2022 Electrophysiology [facility]

## 2022-05-31 ENCOUNTER — Encounter (HOSPITAL_COMMUNITY): Payer: Self-pay | Admitting: Cardiology

## 2022-05-31 ENCOUNTER — Telehealth: Payer: Self-pay | Admitting: Cardiology

## 2022-05-31 ENCOUNTER — Telehealth: Payer: Self-pay | Admitting: *Deleted

## 2022-05-31 NOTE — Telephone Encounter (Signed)
  [place] TEAM   Patient contacted regarding discharge from [facility] on 05/30/22   Patient understands to follow up with provider Kathyrn Drown, NP-C on 07/01/22 at 1015am.  Patient understands discharge instructions? Yes  Patient understands medications and regimen? Yes  Patient understands to bring all medications to this visit? Yes   Kathyrn Drown NP-C Structural Heart Team  Pager: [phone]

## 2022-05-31 NOTE — Patient Outreach (Signed)
  Care Coordination [facility] Note Transition Care Management Follow-up Telephone Call Date of discharge and from where: Thursday, 05/30/22, Connor [name]; Atrial Fibrillation; Watchman's device placement How have you been since you were released from the hospital? "I am doing great, having no problems.  My wife is keeping a close eye on me, but I am able to do everything for myself and on my own.  They called me form the cardiology office today, I have the appointment for one month like they recommended and I have their phone number if I run into questions or concerns" Any questions or concerns? No  Items Reviewed: Did the pt receive and understand the discharge instructions provided? Yes  Medications obtained and verified? Yes - verified no medication changes post-procedure; verified patient self-manages medications and denies questions and concerns around medications Other? No  Any new allergies since your discharge? No  Dietary orders reviewed? Yes Do you have support at home? Yes  patient reports he is independent in self-care activities; reports wife is able to assist as/ if indicated/ needed  Home Care and Equipment/Supplies: Were home health services ordered? no If so, what is the name of the agency? N/A  Has the agency set up a time to come to the patient's home? not applicable Were any new equipment or medical supplies ordered?  No What is the name of the medical supply agency? N/A Were you able to get the supplies/equipment? not applicable Do you have any questions related to the use of the equipment or supplies? No  Functional Questionnaire: (I = Independent and D = Dependent) ADLs: I  Bathing/Dressing- I  Meal Prep- I  Eating- I  Maintaining continence- I  Transferring/Ambulation- I  Managing Meds- I  Follow up appointments reviewed:  PCP Hospital f/u appt confirmed? Yes  Scheduled to see PCP on Monday 06/17/22 @ 10:20 am Specialist Hospital f/u appt confirmed? Yes   Scheduled to see cardiology provider on Monday 07/01/22 @ 10:15- verified this is recommended time frame for follow up per cardiology provider hospital notes Are transportation arrangements needed? No  If their condition worsens, is the pt aware to call PCP or go to the Emergency Dept.? Yes Was the patient provided with contact information for the PCP's office or ED? No- patient declined; reports already has contact information for all care providers Was to pt encouraged to call back with questions or concerns? Yes  SDOH assessments and interventions completed:   Yes SDOH Interventions Today    Flowsheet Row Most Recent Value  SDOH Interventions   Food Insecurity Interventions Intervention Not Indicated  Transportation Interventions Intervention Not Indicated  [drives self]      Interventions Today    Flowsheet Row Most Recent Value  Chronic Disease Discussed/Reviewed   Chronic disease discussed/reviewed during today's visit Atrial Fibrillation (AFib)  General Adult Interventions   General Interventions Discussed/Reviewed Doctor Visits  Doctor Visits Discussed/Reviewed PCP, Specialist  PCP/Specialist Visits Compliance with follow-up visit  Nutrition Interventions   Nutrition Discussed/Reviewed Nutrition Reviewed       Care Coordination Interventions:  No Care Coordination interventions needed at this time.   Encounter Outcome:  Pt. Visit Completed    Oneta Rack, RN, BSN, CCRN Alumnus RN CM Care Coordination/ Transition of [place] Management [phone]: direct office

## 2022-06-03 ENCOUNTER — Ambulatory Visit: Payer: Medicare PPO | Admitting: Physical Therapy

## 2022-06-05 ENCOUNTER — Encounter: Payer: Self-pay | Admitting: Physical Therapy

## 2022-06-10 ENCOUNTER — Encounter: Payer: Self-pay | Admitting: Physical Therapy

## 2022-06-10 ENCOUNTER — Ambulatory Visit: Payer: Medicare PPO | Attending: Family Medicine | Admitting: Physical Therapy

## 2022-06-10 DIAGNOSIS — R2681 Unsteadiness on feet: Secondary | ICD-10-CM | POA: Diagnosis not present

## 2022-06-10 DIAGNOSIS — R252 Cramp and spasm: Secondary | ICD-10-CM | POA: Insufficient documentation

## 2022-06-10 DIAGNOSIS — M6281 Muscle weakness (generalized): Secondary | ICD-10-CM | POA: Diagnosis not present

## 2022-06-10 DIAGNOSIS — R262 Difficulty in walking, not elsewhere classified: Secondary | ICD-10-CM | POA: Diagnosis not present

## 2022-06-10 NOTE — Therapy (Signed)
OUTPATIENT PHYSICAL THERAPY THORACOLUMBAR TREATMENT    Patient Name: Connor [name] MRN: [id number] DOB:1933/10/24, 89 y.o., male Today's Date: 06/10/2022  END OF SESSION:  PT End of Session - 06/10/22 0935     Visit Number 11    Number of Visits 13    Date for PT Re-Evaluation 07/03/22    Authorization Type [employer]    PT Start Time 0929    PT Stop Time 5035    PT Time Calculation (min) 46 min    Activity Tolerance Patient tolerated treatment well    Behavior During Therapy [facility] for tasks assessed/performed             Past Medical History:  Diagnosis Date   Benign localized prostatic hyperplasia with lower urinary tract symptoms (LUTS)    urologist-- dr Diona Fanti   Cancer [facility])    prostate   Chronic anticoagulation    Xarelto for afib   Chronic constipation    Chronic cystitis    Gross hematuria    History of DVT of lower extremity 06/08/2010   right femoral dvt dx post op total hip 05-08-2010   History of gastric ulcer    remote   History of kidney stones    one stone. remote hx   Hypercholesteremia    Hyperlipidemia    Hypertension    Dx by Dr. Lance Muss around age  60    Macular degeneration    dry   NICM (nonischemic cardiomyopathy) ([place])    per echo 06-11-2017,  ef 45-50% with diffuse hypokinesis,  G1DD   OSA on CPAP 2017   compliant with CPAP.  managed by Dr Elsworth Soho.   Osteoarthritis    Other urethral stricture, male, meatal    chronic;  pt self dilates   Paroxysmal atrial fibrillation [facility]) cardiologist-- dr Rayann Heman   first dx 2009/   a. documented by Dr Barrett Shell on office EKG 9/12. b. maintained on Tikosyn and Xarelto. c. s/p DCCV 's.  d.  x3  EP w/ ablation atrial fib last one 12-05-2016   Prediabetes    Premature ventricular contraction    Presence of Watchman left atrial appendage closure device 05/30/2022   Watchman FLX 57m by Dr. LQuentin Ore  PVC's (premature ventricular contractions)    RA (rheumatoid arthritis) ([facility]    rheumatologist--- Dr  APage Spiro  treated with Humira   RBBB (right bundle branch block)    Hx of a fib, ablation Aug 2018   [place]heumatoid arthritis ([place]    Vitamin D deficiency    Wears hearing aid in both ears    Past Surgical History:  Procedure Laterality Date   ABLATION OF DYSRHYTHMIC FOCUS  08/29/2015   ATRIAL FIBRILLATION ABLATION  12/05/2016   ATRIAL FIBRILLATION ABLATION N/A 12/05/2016   Procedure: Atrial Fibrillation Ablation;  Surgeon: AThompson Grayer MD;  [place];  Service: Cardiovascular;  Laterality: N/A;   ATRIAL FIBRILLATION ABLATION N/A 02/29/2020   Procedure: ATRIAL FIBRILLATION ABLATION;  Surgeon: AThompson Grayer MD;  [place];  Service: Cardiovascular;  Laterality: N/A;   ATRIAL FLUTTER ABLATION  09/2010   by JGlenford BayleyRight 1990s   CARDIOVERSION N/A 10/28/2012   Procedure: CARDIOVERSION;  Surgeon: CBurnell Blanks MD;  [place]  Service: Cardiovascular;  Laterality: N/A;   CATARACT EXTRACTION W/ INTRAOCULAR LENS  IMPLANT, BILATERAL  2019   CYSTOSCOPY WITH BIOPSY N/A 09/03/2018   Procedure: CYSTOSCOPY WITH FULGURATION OFran Lowes  Surgeon: DFranchot Gallo MD;  [facility];  Service: Urology;  Laterality: N/A;  [place] N/A 01/23/2022   Procedure: CYSTOSCOPY WITH FULGERATION OF PROSTATIC URETHRA;  Surgeon: Franchot Gallo, MD;  [facility];  Service: Urology;  Laterality: N/A;  [place] N/A 08/29/2015   Procedure: Atrial Fibrillation Ablation;  Surgeon: [name], MD;  Location: [place] CV LAB;  Service: Cardiovascular;  Laterality: N/A;   KNEE SURGERY Bilateral x3   1960s & 1970s   open   LEFT ATRIAL APPENDAGE OCCLUSION N/A 05/30/2022   Procedure: LEFT ATRIAL APPENDAGE OCCLUSION;  Surgeon: Vickie Epley, MD;  Location: [place] CV LAB;  Service: Cardiovascular;  Laterality: N/A;   LEFT HEART CATHETERIZATION WITH CORONARY  ANGIOGRAM N/A 06/07/2011   Procedure: LEFT HEART CATHETERIZATION WITH CORONARY ANGIOGRAM;  Surgeon: Peter M Martinique, MD;  Location: [facility] CATH LAB;  Service: Cardiovascular;  Laterality: N/A;    Normal coronary arteries,  low normal LVF   TEE WITHOUT CARDIOVERSION N/A 05/30/2022   Procedure: TRANSESOPHAGEAL ECHOCARDIOGRAM (TEE);  Surgeon: Vickie Epley, MD;  Location: [place] CV LAB;  Service: Cardiovascular;  Laterality: N/A;   TONSILLECTOMY AND ADENOIDECTOMY  age 74   TOTAL HIP ARTHROPLASTY Right 06/04/10    dr Noemi Chapel  '@[facility]    TOTAL KNEE ARTHROPLASTY Bilateral right 1995;  left [place]  06-29-2002   dr humphries  '@[facility]    TRANSURETHRAL RESECTION OF PROSTATE N/A 07/06/2018   Procedure: TRANSURETHRAL RESECTION OF THE PROSTATE (TURP);  Surgeon: Franchot Gallo, MD;  Location: [facility];  Service: Urology;  Laterality: N/A;  25 MINS   Patient Active Problem List   Diagnosis Date Noted   Atrial fibrillation ([place]) 05/30/2022   Presence of Watchman left atrial appendage closure device 05/30/2022   Hematuria 01/04/2022   Gross hematuria 01/04/2022   Secondary hypercoagulable state ([place]) 03/28/2020   Malignant neoplasm of prostate ([place]) 09/11/2018   Enlarged prostate with urinary obstruction 07/06/2018   Gastrocnemius strain, right, initial encounter 12/10/2017   Baker's cyst of knee, right 12/10/2017   Obesity 03/24/2017   Paroxysmal atrial fibrillation ([place]) 12/05/2016   Near syncope 04/20/2016   Leg hematoma, left, initial encounter 04/20/2016   Bleeding    Ecchymosis    Left hamstring muscle strain    Muscle tear    RBBB 14/97/0263   Chronic systolic dysfunction of left ventricle 07/11/2014   OSA (obstructive sleep apnea) 04/07/2013   Multinodular goiter 01/20/2013   Cervical spondylosis without myelopathy 01/18/2013   Left ventricular dysfunction 12/17/2011   Low back pain 09/11/2011   Fatigue 05/22/2011   PVC (premature  ventricular contraction) 01/18/2011   Persistent atrial fibrillation ([place]) 01/12/2011   Hyperlipidemia 08/02/2010   Hypertension 08/02/2010   Osteoarthritis 08/02/2010   BPH (benign prostatic hyperplasia) 08/02/2010   GERD (gastroesophageal reflux disease) 08/02/2010   h/o Atrial flutter ([place]) s/p ablation 2012     PCP: Copland, MD  REFERRING PROVIDER: Copland, MD  REFERRING DIAG: Debility  Rationale for Evaluation and Treatment: Rehabilitation  THERAPY DIAG:  Muscle weakness (generalized)  Difficulty in walking, not elsewhere classified  Unsteadiness on feet  Cramp and spasm  ONSET DATE: August 2023  SUBJECTIVE:  SUBJECTIVE STATEMENT: Had the procedure 2 weeks ago, had some fatigue from being in the house more and having some restrictions from the procedure, reports no pain, no falls PAIN: has knee and hip issues with DJD  PRECAUTIONS: None  WEIGHT BEARING RESTRICTIONS: No  FALLS:  Has patient fallen in last 6 months? No  LIVING ENVIRONMENT: Lives with: lives with their family Lives in: House/apartment Stairs: Yes: Internal: 12 steps; can reach both Has following equipment at home: None  OCCUPATION: retired  PLOF: Independent  was working out at [employer] 2x/week  PATIENT GOALS: be stronger, move better, return to the gym  NEXT MD VISIT:   OBJECTIVE:   DIAGNOSTIC FINDINGS:  none  COGNITION: Overall cognitive status: Within functional limits for tasks assessed     SENSATION: WFL  MUSCLE LENGTH: Hamstrings: Right 45 deg; Left 45 deg POSTURE: rounded shoulders and forward head  PALPATION: Non tender  LUMBAR ROM: decreased 50% due to stiffness  LOWER EXTREMITY ROM:     Active  Right eval Left eval  Hip flexion    Hip extension    Hip abduction    Hip  adduction    Hip internal rotation    Hip external rotation    Knee flexion    Knee extension 10 15  Ankle dorsiflexion    Ankle plantarflexion    Ankle inversion    Ankle eversion     (Blank rows = not tested)  LOWER EXTREMITY MMT:    MMT Right eval Left eval  Hip flexion 3+ 3+  Hip extension 4- 4-  Hip abduction 3+ 3+  Hip adduction    Hip internal rotation    Hip external rotation    Knee flexion 4 4  Knee extension 3+ 3+  Ankle dorsiflexion 4 4  Ankle plantarflexion 3+ 3+  Ankle inversion    Ankle eversion     (Blank rows = not tested)   FUNCTIONAL TESTS:  5 times sit to stand: 25 seconds Timed up and go (TUG): 22  seconds 6 minute walk test:  had to stop at 3 minutes at 540', got very SOB and was unsteady   TUG 06/10/22 16 seconds   6 minute walk test 06/10/22 = 800 feet with SOB and one standing rest break  5XSTS 06/10/22 20 seconds GAIT: Distance walked: 100' Assistive device utilized: None Level of assistance: Complete Independence Comments: slow, WBOS, stairs one at a time  TODAY'S TREATMENT:                                                                                                                              DATE:  06/10/22 Nustep level 5 x 6 minutes Leg curls 25# 3x10 Leg extension 5# 3x10 Volleyball 10# straight arm pulls Bike level 5 x 5 minutes UBE level 3 x 5 minutes Feet on ball K2C, trunk rotation and isometric abs LE stretches  05/22/22 UBE level 4 x 6 mintues Nustep  level 5 x 6 minutes Volleyball LEg curls 35# 3x10 Leg extension 5# 3x10 Seated row 25# LAts 25# 3x10 10# straight arm pulls 2x10 Passive stretch LE's  05/20/22 Bike level 4 x 6 minutes Calf stretches Seated rows 25# 3x10 Lats 25# 3x10 Chest press 10# 3x10 difficult for him Side step on and off airex On airex ball toss Leg curls 25# 3x10 Leg extension 5# 3x10 Passive stretch LE's  05/15/22 UBE level 4 x 5 minutes Bike level 4 x 6 minutes Seated row 25# Lats  25# Chest press 10# On airex cone toe touches and ball toss Biceps 5# Triceps 25# Green tband clamshells Bridges Feet on ball K2C, bridges and isometric abs Passive stretch hS and piriformis  05/13/22 Nustep Level 5 x 6 minutes Bike Level 4 x 6 minutes Side step on and off airex On airex cone toe touches On airex ball toss Eyes closed on airex Leg extension 5# 2x10 Leg curls 35# 2x10 30# resisted walking all directions Passive stretch HS and piriformis PATIENT EDUCATION:  Education details: POC Person educated: Patient Education method: Explanation Education comprehension: verbalized understanding  HOME EXERCISE PROGRAM: TBD  ASSESSMENT:  CLINICAL IMPRESSION: PAtient had a procedure performed on the 25th of January and was told to skip last week due to the healing process.  He seemed to tolerate  the exercises today without and adverse affects.  He does seem to be moving a little slower and c/o more fatigue.  I retested his 5xSTS and it decreased by 5 seconds, his 6 minute walk test improved by 260 feet, and his TUG time decreased by 6 visits OBJECTIVE IMPAIRMENTS: Abnormal gait, cardiopulmonary status limiting activity, decreased activity tolerance, decreased balance, decreased endurance, decreased mobility, difficulty walking, decreased ROM, decreased strength, and impaired flexibility.   REHAB POTENTIAL: Good  CLINICAL DECISION MAKING: Stable/uncomplicated  EVALUATION COMPLEXITY: Low   GOALS: Goals reviewed with patient? Yes  SHORT TERM GOALS: Target date: 04/22/22  Independent with initial HEP Goal status: met  LONG TERM GOALS: Target date: 07/03/22  Understand posture and body mechanics Goal status: progressing  2.  Decrease TUG time to 13 seconds Goal status: progressing  3.  Decrease 5x STS to 15 seconds Goal status: progressing  4.  Increase 6 minute walk test to 1200 feet Goal status:progressing  5.  Return safely to the gym Goal status:  progressing  PLAN:  PT FREQUENCY: 1-2x/week  PT DURATION: 12 weeks  PLANNED INTERVENTIONS: Therapeutic exercises, Therapeutic activity, Neuromuscular re-education, Balance training, Gait training, Patient/Family education, Self Care, Joint mobilization, Stair training, and Manual therapy.  PLAN FOR NEXT SESSION: will look to get renewal and assure that he is improving to get back to his PLOF after his recent heart procedure   Sumner Boast, PT 06/10/2022, 9:36 AM

## 2022-06-12 DIAGNOSIS — E349 Endocrine disorder, unspecified: Secondary | ICD-10-CM | POA: Diagnosis not present

## 2022-06-12 DIAGNOSIS — C61 Malignant neoplasm of prostate: Secondary | ICD-10-CM | POA: Diagnosis not present

## 2022-06-12 DIAGNOSIS — R310 Gross hematuria: Secondary | ICD-10-CM | POA: Diagnosis not present

## 2022-06-13 ENCOUNTER — Ambulatory Visit: Payer: Medicare PPO | Admitting: Physical Therapy

## 2022-06-13 ENCOUNTER — Telehealth: Payer: Self-pay | Admitting: Family Medicine

## 2022-06-13 ENCOUNTER — Encounter (HOSPITAL_COMMUNITY): Payer: Self-pay | Admitting: *Deleted

## 2022-06-13 ENCOUNTER — Encounter: Payer: Self-pay | Admitting: Physical Therapy

## 2022-06-13 DIAGNOSIS — M6281 Muscle weakness (generalized): Secondary | ICD-10-CM | POA: Diagnosis not present

## 2022-06-13 DIAGNOSIS — R262 Difficulty in walking, not elsewhere classified: Secondary | ICD-10-CM

## 2022-06-13 DIAGNOSIS — R252 Cramp and spasm: Secondary | ICD-10-CM

## 2022-06-13 DIAGNOSIS — R2681 Unsteadiness on feet: Secondary | ICD-10-CM

## 2022-06-13 NOTE — Therapy (Signed)
OUTPATIENT PHYSICAL THERAPY THORACOLUMBAR TREATMENT    Patient Name: Connor [name] MRN: [id number] DOB:10-May-1933, 89 y.o., male Today's Date: 06/13/2022  END OF SESSION:  PT End of Session - 06/13/22 0927     Visit Number 12    Number of Visits [place] Time Period 2/10    PT Start Time 0927    PT Stop Time 1015    PT Time Calculation (min) 48 min    Activity Tolerance Patient tolerated treatment well    Behavior During Therapy [facility] for tasks assessed/performed             Past Medical History:  Diagnosis Date   Benign localized prostatic hyperplasia with lower urinary tract symptoms (LUTS)    urologist-- dr Diona Fanti   Cancer [facility])    prostate   Chronic anticoagulation    Xarelto for afib   Chronic constipation    Chronic cystitis    Gross hematuria    History of DVT of lower extremity 06/08/2010   right femoral dvt dx post op total hip 05-08-2010   History of gastric ulcer    remote   History of kidney stones    one stone. remote hx   Hypercholesteremia    Hyperlipidemia    Hypertension    Dx by Dr. Lance Muss around age  56    Macular degeneration    dry   NICM (nonischemic cardiomyopathy) ([place])    per echo 06-11-2017,  ef 45-50% with diffuse hypokinesis,  G1DD   OSA on CPAP 2017   compliant with CPAP.  managed by Dr Elsworth Soho.   Osteoarthritis    Other urethral stricture, male, meatal    chronic;  pt self dilates   Paroxysmal atrial fibrillation [facility]) cardiologist-- dr Rayann Heman   first dx 2009/   a. documented by Dr Barrett Shell on office EKG 9/12. b. maintained on Tikosyn and Xarelto. c. s/p DCCV 's.  d.  x3  EP w/ ablation atrial fib last one 12-05-2016   Prediabetes    Premature ventricular contraction    Presence of Watchman left atrial appendage closure device 05/30/2022   Watchman FLX 79m by Dr. LQuentin Ore  PVC's (premature ventricular contractions)    RA (rheumatoid arthritis) ([facility]    rheumatologist--- Dr APage Spiro  treated with Humira   RBBB (right bundle branch block)    Hx of a fib, ablation Aug 2018   Rheumatoid arthritis ([place]    Vitamin D deficiency    Wears hearing aid in both ears    Past Surgical History:  Procedure Laterality Date   ABLATION OF DYSRHYTHMIC FOCUS  08/29/2015   ATRIAL FIBRILLATION ABLATION  12/05/2016   ATRIAL FIBRILLATION ABLATION N/A 12/05/2016   Procedure: Atrial Fibrillation Ablation;  Surgeon: AThompson Grayer MD;  [place] [facility];  Service: Cardiovascular;  Laterality: N/A;   ATRIAL FIBRILLATION ABLATION N/A 02/29/2020   Procedure: ATRIAL FIBRILLATION ABLATION;  Surgeon: AThompson Grayer MD;  [place] [facility];  Service: Cardiovascular;  Laterality: N/A;   ATRIAL FLUTTER ABLATION  09/2010   by JGlenford BayleyRight 1990s   CARDIOVERSION N/A 10/28/2012   Procedure: CARDIOVERSION;  Surgeon: CBurnell Blanks MD;  Location: [place]  Service: Cardiovascular;  Laterality: N/A;   CATARACT EXTRACTION W/ INTRAOCULAR LENS  IMPLANT, BILATERAL  2019   CYSTOSCOPY WITH BIOPSY N/A 09/03/2018   Procedure: CYSTOSCOPY WITH FULGURATION OFran Lowes  Surgeon: DFranchot Gallo MD;  [facility];  Service: Urology;  Laterality: N/A;  [place] N/A 01/23/2022   Procedure: CYSTOSCOPY WITH FULGERATION OF PROSTATIC URETHRA;  Surgeon: Franchot Gallo, MD;  [facility];  Service: Urology;  Laterality: N/A;  [place] N/A 08/29/2015   Procedure: Atrial Fibrillation Ablation;  Surgeon: [name], MD;  Location: [place] CV LAB;  Service: Cardiovascular;  Laterality: N/A;   KNEE SURGERY Bilateral x3   1960s & 1970s   open   LEFT ATRIAL APPENDAGE OCCLUSION N/A 05/30/2022   Procedure: LEFT ATRIAL APPENDAGE OCCLUSION;  Surgeon: Vickie Epley, MD;  Location: [place] CV LAB;  Service: Cardiovascular;  Laterality: N/A;   LEFT HEART CATHETERIZATION WITH CORONARY  ANGIOGRAM N/A 06/07/2011   Procedure: LEFT HEART CATHETERIZATION WITH CORONARY ANGIOGRAM;  Surgeon: Peter M Martinique, MD;  Location: [facility] CATH LAB;  Service: Cardiovascular;  Laterality: N/A;    Normal coronary arteries,  low normal LVF   TEE WITHOUT CARDIOVERSION N/A 05/30/2022   Procedure: TRANSESOPHAGEAL ECHOCARDIOGRAM (TEE);  Surgeon: Vickie Epley, MD;  Location: [place] CV LAB;  Service: Cardiovascular;  Laterality: N/A;   TONSILLECTOMY AND ADENOIDECTOMY  age 77   TOTAL HIP ARTHROPLASTY Right 06/04/10    dr Noemi Chapel  '@[facility]'$    TOTAL KNEE ARTHROPLASTY Bilateral right 1995;  left [place]  06-29-2002   dr humphries  '@[facility]'$    TRANSURETHRAL RESECTION OF PROSTATE N/A 07/06/2018   Procedure: TRANSURETHRAL RESECTION OF THE PROSTATE (TURP);  Surgeon: Franchot Gallo, MD;  Location: [facility];  Service: Urology;  Laterality: N/A;  6 MINS   Patient Active Problem List   Diagnosis Date Noted   Atrial fibrillation ([place]) 05/30/2022   Presence of Watchman left atrial appendage closure device 05/30/2022   Hematuria 01/04/2022   Gross hematuria 01/04/2022   Secondary hypercoagulable state ([place]) 03/28/2020   Malignant neoplasm of prostate ([place]) 09/11/2018   Enlarged prostate with urinary obstruction 07/06/2018   Gastrocnemius strain, right, initial encounter 12/10/2017   Baker's cyst of knee, right 12/10/2017   Obesity 03/24/2017   Paroxysmal atrial fibrillation ([place]) 12/05/2016   Near syncope 04/20/2016   Leg hematoma, left, initial encounter 04/20/2016   Bleeding    Ecchymosis    Left hamstring muscle strain    Muscle tear    RBBB 40/97/3532   Chronic systolic dysfunction of left ventricle 07/11/2014   OSA (obstructive sleep apnea) 04/07/2013   Multinodular goiter 01/20/2013   Cervical spondylosis without myelopathy 01/18/2013   Left ventricular dysfunction 12/17/2011   Low back pain 09/11/2011   Fatigue 05/22/2011   PVC (premature  ventricular contraction) 01/18/2011   Persistent atrial fibrillation ([place]) 01/12/2011   Hyperlipidemia 08/02/2010   Hypertension 08/02/2010   Osteoarthritis 08/02/2010   BPH (benign prostatic hyperplasia) 08/02/2010   GERD (gastroesophageal reflux disease) 08/02/2010   h/o Atrial flutter ([place]) s/p ablation 2012     PCP: Copland, MD  REFERRING PROVIDER: Copland, MD  REFERRING DIAG: Debility  Rationale for Evaluation and Treatment: Rehabilitation  THERAPY DIAG:  Muscle weakness (generalized)  Difficulty in walking, not elsewhere classified  Unsteadiness on feet  Cramp and spasm  ONSET DATE: August 2023  SUBJECTIVE:  SUBJECTIVE STATEMENT: Doing well, no issues, no stumbles, no pain PAIN: has knee and hip issues with DJD  PRECAUTIONS: None  WEIGHT BEARING RESTRICTIONS: No  FALLS:  Has patient fallen in last 6 months? No  LIVING ENVIRONMENT: Lives with: lives with their family Lives in: House/apartment Stairs: Yes: Internal: 12 steps; can reach both Has following equipment at home: None  OCCUPATION: retired  PLOF: Independent  was working out at [employer] 2x/week  PATIENT GOALS: be stronger, move better, return to the gym  NEXT MD VISIT:   OBJECTIVE:   DIAGNOSTIC FINDINGS:  none  COGNITION: Overall cognitive status: Within functional limits for tasks assessed     SENSATION: WFL  MUSCLE LENGTH: Hamstrings: Right 45 deg; Left 45 deg POSTURE: rounded shoulders and forward head  PALPATION: Non tender  LUMBAR ROM: decreased 50% due to stiffness  LOWER EXTREMITY ROM:     Active  Right eval Left eval  Hip flexion    Hip extension    Hip abduction    Hip adduction    Hip internal rotation    Hip external rotation    Knee flexion    Knee extension 10 15  Ankle  dorsiflexion    Ankle plantarflexion    Ankle inversion    Ankle eversion     (Blank rows = not tested)  LOWER EXTREMITY MMT:    MMT Right eval Left eval  Hip flexion 3+ 3+  Hip extension 4- 4-  Hip abduction 3+ 3+  Hip adduction    Hip internal rotation    Hip external rotation    Knee flexion 4 4  Knee extension 3+ 3+  Ankle dorsiflexion 4 4  Ankle plantarflexion 3+ 3+  Ankle inversion    Ankle eversion     (Blank rows = not tested)   FUNCTIONAL TESTS:  5 times sit to stand: 25 seconds Timed up and go (TUG): 22  seconds 6 minute walk test:  had to stop at 3 minutes at 540', got very SOB and was unsteady   TUG 06/10/22 16 seconds   6 minute walk test 06/10/22 = 800 feet with SOB and one standing rest break  5XSTS 06/10/22 20 seconds GAIT: Distance walked: 100' Assistive device utilized: None Level of assistance: Complete Independence Comments: slow, WBOS, stairs one at a time  TODAY'S TREATMENT:                                                                                                                              DATE:  06/13/22 Nustep level 5 x 6 minutes Cone toe touches solid surface and on airex 2.5# marches, hip abduction and extension 2x10 On airex balance beam side stepping Bike level 4 x 6 minutes Leg curls 35# 3x10 Leg extension 5# 3x10 UBE level 3 x 6 minutes  06/10/22 Nustep level 5 x 6 minutes Leg curls 25# 3x10 Leg extension 5# 3x10 Volleyball 10# straight arm pulls Bike level  5 x 5 minutes UBE level 3 x 5 minutes Feet on ball K2C, trunk rotation and isometric abs LE stretches  05/22/22 UBE level 4 x 6 mintues Nustep level 5 x 6 minutes Volleyball LEg curls 35# 3x10 Leg extension 5# 3x10 Seated row 25# LAts 25# 3x10 10# straight arm pulls 2x10 Passive stretch LE's  05/20/22 Bike level 4 x 6 minutes Calf stretches Seated rows 25# 3x10 Lats 25# 3x10 Chest press 10# 3x10 difficult for him Side step on and off airex On airex ball  toss Leg curls 25# 3x10 Leg extension 5# 3x10 Passive stretch LE's  05/15/22 UBE level 4 x 5 minutes Bike level 4 x 6 minutes Seated row 25# Lats 25# Chest press 10# On airex cone toe touches and ball toss Biceps 5# Triceps 25# Green tband clamshells Bridges Feet on ball K2C, bridges and isometric abs Passive stretch hS and piriformis  PATIENT EDUCATION:  Education details: POC Person educated: Patient Education method: Explanation Education comprehension: verbalized understanding  HOME EXERCISE PROGRAM: TBD  ASSESSMENT:  CLINICAL IMPRESSION: We talked today about we were approved for more visits with his insurance and we discussed his biggest issues, needs and want from PT, he felt balance and endurance were his biggest issues and would like to work on those, we did a lot of balance activities, he does struggle on the dynamic surfaces  OBJECTIVE IMPAIRMENTS: Abnormal gait, cardiopulmonary status limiting activity, decreased activity tolerance, decreased balance, decreased endurance, decreased mobility, difficulty walking, decreased ROM, decreased strength, and impaired flexibility.   REHAB POTENTIAL: Good  CLINICAL DECISION MAKING: Stable/uncomplicated  EVALUATION COMPLEXITY: Low   GOALS: Goals reviewed with patient? Yes  SHORT TERM GOALS: Target date: 04/22/22  Independent with initial HEP Goal status: met  LONG TERM GOALS: Target date: 07/03/22  Understand posture and body mechanics Goal status: progressing  2.  Decrease TUG time to 13 seconds Goal status: progressing  3.  Decrease 5x STS to 15 seconds Goal status: progressing  4.  Increase 6 minute walk test to 1200 feet Goal status:progressing  5.  Return safely to the gym Goal status: progressing  PLAN:  PT FREQUENCY: 1-2x/week  PT DURATION: 12 weeks  PLANNED INTERVENTIONS: Therapeutic exercises, Therapeutic activity, Neuromuscular re-education, Balance training, Gait training,  Patient/Family education, Self Care, Joint mobilization, Stair training, and Manual therapy.  PLAN FOR NEXT SESSION:  get back to his PLOF after his recent heart procedure work on endurance and balance   [employer], PT 06/13/2022, 9:28 AM

## 2022-06-13 NOTE — Telephone Encounter (Signed)
Copied from [place] [phone]. Topic: Medicare AWV >> Jun 13, 2022 11:03 AM Devoria Glassing wrote: Reason for CRM: Left message for patient to schedule Annual Wellness Visit(AWV).  Please schedule with Health Nurse Advisor at [facility]. Please call [phone] ask for [facility].

## 2022-06-16 NOTE — Patient Instructions (Incomplete)
It was great to see again today I recommend the latest COVID booster at your convenience if not done already  Let's plan to visit in 4 -6 months or so and check on your progress If not done elsewhere let's have you make a lab appt only in 4-6 weeks and check your blood counts, cholesterol

## 2022-06-16 NOTE — Progress Notes (Unsigned)
[place] [facility] [address], [place], [place] [address] [phone]  Date:  06/17/2022   Name:  Connor [name]   DOB:  [birth year]   MRN:  [id number]  PCP:  Darreld Mclean, MD    Chief Complaint: No chief complaint on file.   History of Present Illness:  Connor [name] is a 89 y.o. very pleasant male patient who presents with the following:  Patient is seen today for follow-up visit Most recent visit with myself was in November  History of atrial fibrillation, left ventricle dysfunction, prostate cancer, seronegative rheumatoid arthritis and osteoarthritis, sleep apnea.  Has some difficulty using his CPAP and typically uses it for just an hour or 2 nightly He does IM humira weekly for his RA Last fall he was admitted with gross hematuria which required treatment per urology  Married to [place] who I saw just recently, they continue to live independently  At last visit he was anemic-perhaps due to blood loss from hematuria episode in September. However, Hemoccult was also positive-he was seen last month at [facility] but I cannot see this note  Visit with urology earlier this month-stable at that time.  He is also on testosterone replacement\  Finally, he was admitted last month January 25 for a Watchman procedure-hopefully this will allow [place] to stop his blood thinners Medication plan will be resume home dose Xarelto this evening and continue this for 6 weeks post implant at which time he will transition to ASA and Plavix for a 6 month duration   Hemoglobin at [facility] 1/22 was 12.4-  Patient Active Problem List   Diagnosis Date Noted   Atrial fibrillation ([place]) 05/30/2022   Presence of Watchman left atrial appendage closure device 05/30/2022   Hematuria 01/04/2022   Gross hematuria 01/04/2022   Secondary hypercoagulable state ([place]) 03/28/2020   Malignant neoplasm of prostate ([place]) 09/11/2018    Enlarged prostate with urinary obstruction 07/06/2018   Gastrocnemius strain, right, initial encounter 12/10/2017   Baker's cyst of knee, right 12/10/2017   Obesity 03/24/2017   Paroxysmal atrial fibrillation ([place]) 12/05/2016   Near syncope 04/20/2016   Leg hematoma, left, initial encounter 04/20/2016   Bleeding    Ecchymosis    Left hamstring muscle strain    Muscle tear    RBBB 0000000   Chronic systolic dysfunction of left ventricle 07/11/2014   OSA (obstructive sleep apnea) 04/07/2013   Multinodular goiter 01/20/2013   Cervical spondylosis without myelopathy 01/18/2013   Left ventricular dysfunction 12/17/2011   Low back pain 09/11/2011   Fatigue 05/22/2011   PVC (premature ventricular contraction) 01/18/2011   Persistent atrial fibrillation ([place]) 01/12/2011   Hyperlipidemia 08/02/2010   Hypertension 08/02/2010   Osteoarthritis 08/02/2010   BPH (benign prostatic hyperplasia) 08/02/2010   GERD (gastroesophageal reflux disease) 08/02/2010   h/o Atrial flutter ([place]) s/p ablation 2012     Past Medical History:  Diagnosis Date   Benign localized prostatic hyperplasia with lower urinary tract symptoms (LUTS)    urologist-- dr Diona Fanti   [facility])    prostate   Chronic anticoagulation    Xarelto for afib   Chronic constipation    Chronic cystitis    Gross hematuria    History of DVT of lower extremity 06/08/2010   right femoral dvt dx post op total hip 05-08-2010   History of gastric ulcer    remote   History of kidney stones  one stone. remote hx   Hypercholesteremia    Hyperlipidemia    Hypertension    Dx by Dr. Lance Muss around age  22    Macular degeneration    dry   NICM (nonischemic cardiomyopathy) ([place])    per echo 06-11-2017,  ef 45-50% with diffuse hypokinesis,  G1DD   OSA on CPAP 2017   compliant with CPAP.  managed by Dr Elsworth Soho.   Osteoarthritis    Other urethral stricture, male, meatal    chronic;  pt self dilates   Paroxysmal atrial  fibrillation [facility]) cardiologist-- dr Rayann Heman   first dx 2009/   a. documented by Dr Barrett Shell on office EKG 9/12. b. maintained on Tikosyn and Xarelto. c. s/p DCCV 's.  d.  x3  EP w/ ablation atrial fib last one 12-05-2016   Prediabetes    Premature ventricular contraction    Presence of Watchman left atrial appendage closure device 05/30/2022   Watchman FLX 60m by Dr. LQuentin Ore  PVC's (premature ventricular contractions)    RA (rheumatoid arthritis) ([facility]    rheumatologist--- Dr APage Spiro  treated with Humira   RBBB (right bundle branch block)    Hx of a fib, ablation Aug 2018   Rheumatoid arthritis ([place]    Vitamin D deficiency    Wears hearing aid in both ears     Past Surgical History:  Procedure Laterality Date   ABLATION OF DYSRHYTHMIC FOCUS  08/29/2015   ATRIAL FIBRILLATION ABLATION  12/05/2016   ATRIAL FIBRILLATION ABLATION N/A 12/05/2016   Procedure: Atrial Fibrillation Ablation;  Surgeon: AThompson Grayer MD;  [place];  Service: Cardiovascular;  Laterality: N/A;   ATRIAL FIBRILLATION ABLATION N/A 02/29/2020   Procedure: ATRIAL FIBRILLATION ABLATION;  Surgeon: AThompson Grayer MD;  [place] [facility];  Service: Cardiovascular;  Laterality: N/A;   ATRIAL FLUTTER ABLATION  09/2010   by JGlenford BayleyRight 1990s   CARDIOVERSION N/A 10/28/2012   Procedure: CARDIOVERSION;  Surgeon: CBurnell Blanks MD;  [place]  Service: Cardiovascular;  Laterality: N/A;   CATARACT EXTRACTION W/ INTRAOCULAR LENS  IMPLANT, BILATERAL  2019   CYSTOSCOPY WITH BIOPSY N/A 09/03/2018   Procedure: CYSTOSCOPY WITH FULGURATION OFran [place]  Surgeon: DFranchot Gallo MD;  [facility];  Service: Urology;  Laterality: N/A;  [place] 01/23/2022   Procedure: CYSTOSCOPY WITH FULGERATION OF PROSTATIC URETHRA;  Surgeon: DFranchot Gallo MD;  [facility];  Service: Urology;  Laterality: N/A;  [place] 08/29/2015   Procedure: Atrial Fibrillation Ablation;  Surgeon: JThompson Grayer MD;  [place];  Service: Cardiovascular;  Laterality: N/A;   KNEE SURGERY Bilateral x3   1960s & 1970s   open   LEFT ATRIAL APPENDAGE OCCLUSION N/A 05/30/2022   Procedure: LEFT ATRIAL APPENDAGE OCCLUSION;  Surgeon: LVickie Epley MD;  [place] [facility];  Service: Cardiovascular;  Laterality: N/A;   LEFT HEART CATHETERIZATION WITH CORONARY ANGIOGRAM N/A 06/07/2011   Procedure: LEFT HEART CATHETERIZATION WITH CORONARY ANGIOGRAM;  Surgeon: Peter M JMartinique MD;  [facility];  Service: Cardiovascular;  Laterality: N/A;    Normal coronary arteries,  low normal LVF   TEE WITHOUT CARDIOVERSION N/A 05/30/2022   Procedure: TRANSESOPHAGEAL ECHOCARDIOGRAM (TEE);  Surgeon: LVickie Epley MD;  [place];  Service: Cardiovascular;  Laterality: N/A;   TONSILLECTOMY AND ADENOIDECTOMY  age 38   TOTAL HIP ARTHROPLASTY Right 06/04/10    dr Noemi Chapel  @[facility]$    TOTAL KNEE ARTHROPLASTY Bilateral right 1995;  left 1997   TRANSURETHRAL RESECTION OF PROSTATE  06-29-2002   dr humphries  @[facility]$    TRANSURETHRAL RESECTION OF PROSTATE N/A 07/06/2018   Procedure: TRANSURETHRAL RESECTION OF THE PROSTATE (TURP);  Surgeon: Franchot Gallo, MD;  Location: [facility];  Service: Urology;  Laterality: N/A;  54 MINS    Social History   Tobacco Use   Smoking status: Former    Packs/day: 1.00    Years: 30.00    Total pack years: 30.00    Types: Cigarettes    Quit date: 05/06/1978    Years since quitting: 44.1   Smokeless tobacco: Never   Tobacco comments:    Former smoker 03/14/2021  Vaping Use   Vaping Use: Never used  Substance Use Topics   Alcohol use: Yes    Alcohol/week: 1.0 standard drink of alcohol    Types: 1 Cans of beer per week    Comment: occasionally wine and beer   Drug use: Never    Family History  Problem Relation Age of  Onset   Heart attack Father    Lung disease Mother    Arrhythmia Sister    Atrial fibrillation Brother    Diabetes Neg Hx     Allergies  Allergen Reactions   Penicillins Itching, Swelling and Other (See Comments)    Did it involve swelling of the face/tongue/throat, SOB, or low BP? No Did it involve sudden or severe rash/hives, skin peeling, or any reaction on the inside of your mouth or nose? No Did you need to seek medical attention at a hospital or doctor's office? No When did it last happen?      30 + years If all above answers are "NO", may proceed with cephalosporin use.      Medication list has been reviewed and updated.  Current Outpatient Medications on File Prior to Visit  Medication Sig Dispense Refill   acetaminophen (TYLENOL) 500 MG tablet Take 1,000 mg by mouth 2 (two) times daily as needed for moderate pain.     atorvastatin (LIPITOR) 10 MG tablet TAKE 1 TABLET BY MOUTH EVERY DAY (Patient taking differently: Take 10 mg by mouth every evening.) 90 tablet 3   Carboxymethylcellulose Sodium (THERATEARS OP) Apply 1 drop to eye daily as needed (dry eyes).     Cholecalciferol (VITAMIN D3) 5000 UNITS TABS Take 5,000 Units by mouth every morning.     Cranberry 1000 MG CAPS Take 4,000 mg by mouth 2 (two) times daily.     finasteride (PROSCAR) 5 MG tablet Take 5 mg by mouth every morning.   3   HUMIRA PEN 40 MG/0.4ML PNKT Inject 40 mg as directed every 7 (seven) days. Every Tuesday  6   lisinopril (ZESTRIL) 40 MG tablet TAKE 1 TABLET BY MOUTH EVERY DAY 90 tablet 3   Melatonin 5 MG CHEW Chew 15 Pieces of gum by mouth at bedtime. gummy     metoprolol succinate (TOPROL-XL) 50 MG 24 hr tablet Take 1 tablet (50 mg total) by mouth in the morning and at bedtime. Take with or immediately following a meal. 135 tablet 3   Multiple Vitamins-Minerals (PRESERVISION AREDS 2) CAPS Take 1 capsule by mouth 2 (two) times a day.     omeprazole (PRILOSEC) 20 MG capsule Take 20 mg by mouth daily.      rivaroxaban (XARELTO) 20 MG TABS  tablet Take 20 mg by mouth every evening.     senna (SENOKOT) 8.6 MG TABS tablet Take 2 tablets by mouth at bedtime. Per patient taking 2 tablets daily     solifenacin (VESICARE) 10 MG tablet Take 10 mg by mouth at bedtime.      tamsulosin (FLOMAX) 0.4 MG CAPS capsule Take 1 capsule (0.4 mg total) by mouth daily after supper. (Patient taking differently: Take 0.4 mg by mouth every morning.) 30 capsule 5   Testosterone 20.25 MG/ACT (1.62%) GEL Apply 1 Pump topically at bedtime. Apply to shoulders     vitamin B-12 (CYANOCOBALAMIN) 500 MCG tablet Take 500 mcg by mouth every evening.      No current facility-administered medications on file prior to visit.    Review of Systems:  As per HPI- otherwise negative.   Physical Examination: There were no vitals filed for this visit. There were no vitals filed for this visit. There is no height or weight on file to calculate BMI. Ideal Body Weight:    GEN: no acute distress. HEENT: Atraumatic, Normocephalic.  Ears and Nose: No external deformity. CV: RRR, No M/G/R. No JVD. No thrill. No extra heart sounds. PULM: CTA B, no wheezes, crackles, rhonchi. No retractions. No resp. distress. No accessory muscle use. ABD: S, NT, ND, +BS. No rebound. No HSM. EXTR: No c/c/e PSYCH: Normally interactive. Conversant.    Assessment and Plan: ***  Signed Lamar Blinks, MD

## 2022-06-17 ENCOUNTER — Encounter: Payer: Self-pay | Admitting: Family Medicine

## 2022-06-17 ENCOUNTER — Ambulatory Visit: Payer: Medicare PPO | Admitting: Family Medicine

## 2022-06-17 ENCOUNTER — Encounter: Payer: Self-pay | Admitting: Physical Therapy

## 2022-06-17 ENCOUNTER — Ambulatory Visit: Payer: Medicare PPO | Admitting: Physical Therapy

## 2022-06-17 VITALS — BP 138/82 | HR 67 | Ht 72.0 in | Wt 222.0 lb

## 2022-06-17 DIAGNOSIS — R262 Difficulty in walking, not elsewhere classified: Secondary | ICD-10-CM

## 2022-06-17 DIAGNOSIS — R2681 Unsteadiness on feet: Secondary | ICD-10-CM

## 2022-06-17 DIAGNOSIS — I10 Essential (primary) hypertension: Secondary | ICD-10-CM

## 2022-06-17 DIAGNOSIS — I480 Paroxysmal atrial fibrillation: Secondary | ICD-10-CM | POA: Diagnosis not present

## 2022-06-17 DIAGNOSIS — M6281 Muscle weakness (generalized): Secondary | ICD-10-CM

## 2022-06-17 DIAGNOSIS — D62 Acute posthemorrhagic anemia: Secondary | ICD-10-CM | POA: Diagnosis not present

## 2022-06-17 DIAGNOSIS — E782 Mixed hyperlipidemia: Secondary | ICD-10-CM

## 2022-06-17 DIAGNOSIS — R252 Cramp and spasm: Secondary | ICD-10-CM | POA: Diagnosis not present

## 2022-06-17 NOTE — Therapy (Signed)
OUTPATIENT PHYSICAL THERAPY THORACOLUMBAR TREATMENT    Patient Name: Connor [name] MRN: [id number] DOB:[birth year], 89 y.o., male Today's Date: 06/17/2022  END OF SESSION:  PT End of Session - 06/17/22 1359     Visit Number 13    Number of Visits 22    Date for PT Re-Evaluation 07/03/22    Authorization Type [employer]    Authorization Time Period 3/10    PT Start Time 1357    PT Stop Time 1445    PT Time Calculation (min) 48 min    Activity Tolerance Patient tolerated treatment well    Behavior During Therapy WFL for tasks assessed/performed             Past Medical History:  Diagnosis Date   Benign localized prostatic hyperplasia with lower urinary tract symptoms (LUTS)    urologist-- dr Diona Fanti   Cancer [facility])    prostate   Chronic anticoagulation    Xarelto for afib   Chronic constipation    Chronic cystitis    Gross hematuria    History of DVT of lower extremity 06/08/2010   right femoral dvt dx post op total hip 05-08-2010   History of gastric ulcer    remote   History of kidney stones    one stone. remote hx   Hypercholesteremia    Hyperlipidemia    Hypertension    Dx by Dr. Lance Muss around age  79    Macular degeneration    dry   NICM (nonischemic cardiomyopathy) ([place])    per echo 06-11-2017,  ef 45-50% with diffuse hypokinesis,  G1DD   OSA on CPAP 2017   compliant with CPAP.  managed by Dr Elsworth Soho.   Osteoarthritis    Other urethral stricture, male, meatal    chronic;  pt self dilates   Paroxysmal atrial fibrillation [facility]) cardiologist-- dr Rayann Heman   first dx 2009/   a. documented by Dr Barrett Shell on office EKG 9/12. b. maintained on Tikosyn and Xarelto. c. s/p DCCV 's.  d.  x3  EP w/ ablation atrial fib last one 12-05-2016   Prediabetes    Premature ventricular contraction    Presence of Watchman left atrial appendage closure device 05/30/2022   Watchman FLX 15m by Dr. LQuentin Ore  PVC's (premature ventricular contractions)    RA (rheumatoid  arthritis) ([facility]    rheumatologist--- Dr APage Spiro  treated with Humira   RBBB (right bundle branch block)    Hx of a fib, ablation Aug 2018   Rheumatoid arthritis ([place]    Vitamin D deficiency    Wears hearing aid in both ears    Past Surgical History:  Procedure Laterality Date   ABLATION OF DYSRHYTHMIC FOCUS  08/29/2015   ATRIAL FIBRILLATION ABLATION  12/05/2016   ATRIAL FIBRILLATION ABLATION N/A 12/05/2016   Procedure: Atrial Fibrillation Ablation;  Surgeon: AThompson Grayer MD;  [facility] [place];  Service: Cardiovascular;  Laterality: N/A;   ATRIAL FIBRILLATION ABLATION N/A 02/29/2020   Procedure: ATRIAL FIBRILLATION ABLATION;  Surgeon: AThompson Grayer MD;  [place];  Service: Cardiovascular;  Laterality: N/A;   ATRIAL FLUTTER ABLATION  09/2010   by JGlenford BayleyRight 1990s   CARDIOVERSION N/A 10/28/2012   Procedure: CARDIOVERSION;  Surgeon: CBurnell Blanks MD;  [place]  Service: Cardiovascular;  Laterality: N/A;   CATARACT EXTRACTION W/ INTRAOCULAR LENS  IMPLANT, BILATERAL  2019   CYSTOSCOPY WITH BIOPSY N/A 09/03/2018   Procedure: CYSTOSCOPY WITH  FULGURATION [id number];  Surgeon: Franchot Gallo, MD;  [facility];  Service: Urology;  Laterality: N/A;  [place] N/A 01/23/2022   Procedure: CYSTOSCOPY WITH FULGERATION OF PROSTATIC URETHRA;  Surgeon: Franchot Gallo, MD;  [facility];  Service: Urology;  Laterality: N/A;  [place] N/A 08/29/2015   Procedure: Atrial Fibrillation Ablation;  Surgeon: [name], MD;  Location: [place] CV LAB;  Service: Cardiovascular;  Laterality: N/A;   KNEE SURGERY Bilateral x3   1960s & 1970s   open   LEFT ATRIAL APPENDAGE OCCLUSION N/A 05/30/2022   Procedure: LEFT ATRIAL APPENDAGE OCCLUSION;  Surgeon: Vickie Epley, MD;  Location: [place] CV LAB;  Service: Cardiovascular;  Laterality: N/A;    LEFT HEART CATHETERIZATION WITH CORONARY ANGIOGRAM N/A 06/07/2011   Procedure: LEFT HEART CATHETERIZATION WITH CORONARY ANGIOGRAM;  Surgeon: Peter M Martinique, MD;  Location: [facility];  Service: Cardiovascular;  Laterality: N/A;    Normal coronary arteries,  low normal LVF   TEE WITHOUT CARDIOVERSION N/A 05/30/2022   Procedure: TRANSESOPHAGEAL ECHOCARDIOGRAM (TEE);  Surgeon: Vickie Epley, MD;  Location: [place] CV LAB;  Service: Cardiovascular;  Laterality: N/A;   TONSILLECTOMY AND ADENOIDECTOMY  age 3   TOTAL HIP ARTHROPLASTY Right 06/04/10    dr Noemi Chapel  @[facility]$    TOTAL KNEE ARTHROPLASTY Bilateral right 1995;  left 1997   TRANSURETHRAL RESECTION OF PROSTATE  06-29-2002   dr humphries  @[facility]$    TRANSURETHRAL RESECTION OF PROSTATE N/A 07/06/2018   Procedure: TRANSURETHRAL RESECTION OF THE PROSTATE (TURP);  Surgeon: Franchot Gallo, MD;  Location: [facility];  Service: Urology;  Laterality: N/A;  20 MINS   Patient Active Problem List   Diagnosis Date Noted   Atrial fibrillation ([place]) 05/30/2022   Presence of Watchman left atrial appendage closure device 05/30/2022   Hematuria 01/04/2022   Gross hematuria 01/04/2022   Secondary hypercoagulable state ([place]) 03/28/2020   Malignant neoplasm of prostate ([place]) 09/11/2018   Enlarged prostate with urinary obstruction 07/06/2018   Gastrocnemius strain, right, initial encounter 12/10/2017   Baker's cyst of knee, right 12/10/2017   Obesity 03/24/2017   Paroxysmal atrial fibrillation ([place]) 12/05/2016   Near syncope 04/20/2016   Leg hematoma, left, initial encounter 04/20/2016   Bleeding    Ecchymosis    Left hamstring muscle strain    Muscle tear    RBBB 0000000   Chronic systolic dysfunction of left ventricle 07/11/2014   OSA (obstructive sleep apnea) 04/07/2013   Multinodular goiter 01/20/2013   Cervical spondylosis without myelopathy 01/18/2013   Left ventricular dysfunction 12/17/2011   Low back pain 09/11/2011    Fatigue 05/22/2011   PVC (premature ventricular contraction) 01/18/2011   Persistent atrial fibrillation ([place]) 01/12/2011   Hyperlipidemia 08/02/2010   Hypertension 08/02/2010   Osteoarthritis 08/02/2010   BPH (benign prostatic hyperplasia) 08/02/2010   GERD (gastroesophageal reflux disease) 08/02/2010   h/o Atrial flutter ([place]) s/p ablation 2012     PCP: Copland, MD  REFERRING PROVIDER: Copland, MD  REFERRING DIAG: Debility  Rationale for Evaluation and Treatment: Rehabilitation  THERAPY DIAG:  Muscle weakness (generalized)  Difficulty in walking, not elsewhere classified  Unsteadiness on feet  Cramp and spasm  ONSET DATE: August 2023  SUBJECTIVE:  SUBJECTIVE STATEMENT: No problems, just some knee pain PAIN: has knee and hip issues with DJD  PRECAUTIONS: None  WEIGHT BEARING RESTRICTIONS: No  FALLS:  Has patient fallen in last 6 months? No  LIVING ENVIRONMENT: Lives with: lives with their family Lives in: House/apartment Stairs: Yes: Internal: 12 steps; can reach both Has following equipment at home: None  OCCUPATION: retired  PLOF: Independent  was working out at [employer] 2x/week  PATIENT GOALS: be stronger, move better, return to the gym  NEXT MD VISIT:   OBJECTIVE:   DIAGNOSTIC FINDINGS:  none  COGNITION: Overall cognitive status: Within functional limits for tasks assessed     SENSATION: WFL  MUSCLE LENGTH: Hamstrings: Right 45 deg; Left 45 deg POSTURE: rounded shoulders and forward head  PALPATION: Non tender  LUMBAR ROM: decreased 50% due to stiffness  LOWER EXTREMITY ROM:     Active  Right eval Left eval  Hip flexion    Hip extension    Hip abduction    Hip adduction    Hip internal rotation    Hip external rotation    Knee flexion    Knee  extension 10 15  Ankle dorsiflexion    Ankle plantarflexion    Ankle inversion    Ankle eversion     (Blank rows = not tested)  LOWER EXTREMITY MMT:    MMT Right eval Left eval  Hip flexion 3+ 3+  Hip extension 4- 4-  Hip abduction 3+ 3+  Hip adduction    Hip internal rotation    Hip external rotation    Knee flexion 4 4  Knee extension 3+ 3+  Ankle dorsiflexion 4 4  Ankle plantarflexion 3+ 3+  Ankle inversion    Ankle eversion     (Blank rows = not tested)   FUNCTIONAL TESTS:  5 times sit to stand: 25 seconds Timed up and go (TUG): 22  seconds 6 minute walk test:  had to stop at 3 minutes at 540', got very SOB and was unsteady   TUG 06/10/22 16 seconds   6 minute walk test 06/10/22 = 800 feet with SOB and one standing rest break  5XSTS 06/10/22 20 seconds GAIT: Distance walked: 100' Assistive device utilized: None Level of assistance: Complete Independence Comments: slow, WBOS, stairs one at a time  TODAY'S TREATMENT:                                                                                                                              DATE:  06/17/22 Nustep level 5 x 6 minutes High level balance activties, on airex 6" and 8" toe touches, side step on and off airex, side stepping ball toss, fwd walking ball toss, on large mat figure 8's, stepping over objects, obstacle course and walking across foam mat without changing gait speed UBE level 4 x 5 minutes Passive stretch to the HS and piriformis  06/13/22 Nustep level 5 x 6 minutes [place]  toe touches solid surface and on airex 2.5# marches, hip abduction and extension 2x10 On airex balance beam side stepping Bike level 4 x 6 minutes Leg curls 35# 3x10 Leg extension 5# 3x10 UBE level 3 x 6 minutes  06/10/22 Nustep level 5 x 6 minutes Leg curls 25# 3x10 Leg extension 5# 3x10 Volleyball 10# straight arm pulls Bike level 5 x 5 minutes UBE level 3 x 5 minutes Feet on ball K2C, trunk rotation and isometric abs LE  stretches  05/22/22 UBE level 4 x 6 mintues Nustep level 5 x 6 minutes Volleyball LEg curls 35# 3x10 Leg extension 5# 3x10 Seated row 25# LAts 25# 3x10 10# straight arm pulls 2x10 Passive stretch LE's  05/20/22 Bike level 4 x 6 minutes Calf stretches Seated rows 25# 3x10 Lats 25# 3x10 Chest press 10# 3x10 difficult for him Side step on and off airex On airex ball toss Leg curls 25# 3x10 Leg extension 5# 3x10 Passive stretch LE's  05/15/22 UBE level 4 x 5 minutes Bike level 4 x 6 minutes Seated row 25# Lats 25# Chest press 10# On airex cone toe touches and ball toss Biceps 5# Triceps 25# Green tband clamshells Bridges Feet on ball K2C, bridges and isometric abs Passive stretch hS and piriformis  PATIENT EDUCATION:  Education details: POC Person educated: Patient Education method: Explanation Education comprehension: verbalized understanding  HOME EXERCISE PROGRAM: TBD  ASSESSMENT:  CLINICAL IMPRESSION: Patient with some difficulty on the balance, he does report some stiffness in the knee and the back, but feels he is recovering from the heart procedure that he underwent a few weeks ago.  OBJECTIVE IMPAIRMENTS: Abnormal gait, cardiopulmonary status limiting activity, decreased activity tolerance, decreased balance, decreased endurance, decreased mobility, difficulty walking, decreased ROM, decreased strength, and impaired flexibility.   REHAB POTENTIAL: Good  CLINICAL DECISION MAKING: Stable/uncomplicated  EVALUATION COMPLEXITY: Low   GOALS: Goals reviewed with patient? Yes  SHORT TERM GOALS: Target date: 04/22/22  Independent with initial HEP Goal status: met  LONG TERM GOALS: Target date: 07/03/22  Understand posture and body mechanics Goal status: progressing  2.  Decrease TUG time to 13 seconds Goal status: progressing  3.  Decrease 5x STS to 15 seconds Goal status: progressing  4.  Increase 6 minute walk test to 1200 feet Goal  status:progressing  5.  Return safely to the gym Goal status: progressing  PLAN:  PT FREQUENCY: 1-2x/week  PT DURATION: 12 weeks  PLANNED INTERVENTIONS: Therapeutic exercises, Therapeutic activity, Neuromuscular re-education, Balance training, Gait training, Patient/Family education, Self Care, Joint mobilization, Stair training, and Manual therapy.  PLAN FOR NEXT SESSION:  will add some higher level balance activities to his HEP   Sumner Boast, PT 06/17/2022, 2:00 PM

## 2022-06-19 ENCOUNTER — Encounter: Payer: Self-pay | Admitting: Physical Therapy

## 2022-06-19 ENCOUNTER — Ambulatory Visit: Payer: Medicare PPO | Admitting: Physical Therapy

## 2022-06-19 DIAGNOSIS — R2681 Unsteadiness on feet: Secondary | ICD-10-CM

## 2022-06-19 DIAGNOSIS — M6281 Muscle weakness (generalized): Secondary | ICD-10-CM

## 2022-06-19 DIAGNOSIS — R252 Cramp and spasm: Secondary | ICD-10-CM | POA: Diagnosis not present

## 2022-06-19 DIAGNOSIS — R262 Difficulty in walking, not elsewhere classified: Secondary | ICD-10-CM | POA: Diagnosis not present

## 2022-06-19 NOTE — Therapy (Signed)
OUTPATIENT PHYSICAL THERAPY THORACOLUMBAR TREATMENT    Patient Name: Connor [name] MRN: [id number] DOB:08/18/1933, 89 y.o., male Today's Date: 06/19/2022  END OF SESSION:  PT End of Session - 06/19/22 0926     Visit Number 14    Number of Visits 22    Date for PT Re-Evaluation 07/03/22    Authorization Type [employer]    Authorization Time Period 4/10    PT Start Time 0924    PT Stop Time 1015    PT Time Calculation (min) 51 min    Activity Tolerance Patient tolerated treatment well    Behavior During Therapy [facility] for tasks assessed/performed             Past Medical History:  Diagnosis Date   Benign localized prostatic hyperplasia with lower urinary tract symptoms (LUTS)    urologist-- dr Diona Fanti   Cancer [facility])    prostate   Chronic anticoagulation    Xarelto for afib   Chronic constipation    Chronic cystitis    Gross hematuria    History of DVT of lower extremity 06/08/2010   right femoral dvt dx post op total hip 05-08-2010   History of gastric ulcer    remote   History of kidney stones    one stone. remote hx   Hypercholesteremia    Hyperlipidemia    Hypertension    Dx by Dr. Lance Muss around age  89    Macular degeneration    dry   NICM (nonischemic cardiomyopathy) ([place])    per echo 06-11-2017,  ef 45-50% with diffuse hypokinesis,  G1DD   OSA on CPAP 2017   compliant with CPAP.  managed by Dr Elsworth Soho.   Osteoarthritis    Other urethral stricture, male, meatal    chronic;  pt self dilates   Paroxysmal atrial fibrillation [facility]) cardiologist-- dr Rayann Heman   first dx 2009/   a. documented by Dr Barrett Shell on office EKG 9/12. b. maintained on Tikosyn and Xarelto. c. s/p DCCV 's.  d.  x3  EP w/ ablation atrial fib last one 12-05-2016   Prediabetes    Premature ventricular contraction    Presence of Watchman left atrial appendage closure device 05/30/2022   Watchman FLX 33m by Dr. LQuentin Ore  PVC's (premature ventricular contractions)    RA (rheumatoid  arthritis) ([facility]    rheumatologist--- Dr APage Spiro  treated with Humira   RBBB (right bundle branch block)    Hx of a fib, ablation Aug 2018   Rheumatoid arthritis ([place]    Vitamin D deficiency    Wears hearing aid in both ears    Past Surgical History:  Procedure Laterality Date   ABLATION OF DYSRHYTHMIC FOCUS  08/29/2015   ATRIAL FIBRILLATION ABLATION  12/05/2016   ATRIAL FIBRILLATION ABLATION N/A 12/05/2016   Procedure: Atrial Fibrillation Ablation;  Surgeon: AThompson Grayer MD;  [place];  Service: Cardiovascular;  Laterality: N/A;   ATRIAL FIBRILLATION ABLATION N/A 02/29/2020   Procedure: ATRIAL FIBRILLATION ABLATION;  Surgeon: AThompson Grayer MD;  [place] [facility];  Service: Cardiovascular;  Laterality: N/A;   ATRIAL FLUTTER ABLATION  09/2010   by JGlenford BayleyRight 1990s   CARDIOVERSION N/A 10/28/2012   Procedure: CARDIOVERSION;  Surgeon: CBurnell Blanks MD;  [place]  Service: Cardiovascular;  Laterality: N/A;   CATARACT EXTRACTION W/ INTRAOCULAR LENS  IMPLANT, BILATERAL  2019   CYSTOSCOPY WITH BIOPSY N/A 09/03/2018   Procedure: CYSTOSCOPY WITH  FULGURATION [id number];  Surgeon: Franchot Gallo, MD;  Location: [facility];  Service: Urology;  Laterality: N/A;  [place] N/A 01/23/2022   Procedure: CYSTOSCOPY WITH FULGERATION OF PROSTATIC URETHRA;  Surgeon: Franchot Gallo, MD;  Location: [facility];  Service: Urology;  Laterality: N/A;  [place] N/A 08/29/2015   Procedure: Atrial Fibrillation Ablation;  Surgeon: [name], MD;  Location: [place] CV LAB;  Service: Cardiovascular;  Laterality: N/A;   KNEE SURGERY Bilateral x3   1960s & 1970s   open   LEFT ATRIAL APPENDAGE OCCLUSION N/A 05/30/2022   Procedure: LEFT ATRIAL APPENDAGE OCCLUSION;  Surgeon: Vickie Epley, MD;  Location: [place] CV LAB;  Service: Cardiovascular;  Laterality: N/A;    LEFT HEART CATHETERIZATION WITH CORONARY ANGIOGRAM N/A 06/07/2011   Procedure: LEFT HEART CATHETERIZATION WITH CORONARY ANGIOGRAM;  Surgeon: Peter M Martinique, MD;  Location: [facility] CATH LAB;  Service: Cardiovascular;  Laterality: N/A;    Normal coronary arteries,  low normal LVF   TEE WITHOUT CARDIOVERSION N/A 05/30/2022   Procedure: TRANSESOPHAGEAL ECHOCARDIOGRAM (TEE);  Surgeon: Vickie Epley, MD;  Location: [place] CV LAB;  Service: Cardiovascular;  Laterality: N/A;   TONSILLECTOMY AND ADENOIDECTOMY  age 45   TOTAL HIP ARTHROPLASTY Right 06/04/10    dr Noemi Chapel  @[facility]$    TOTAL KNEE ARTHROPLASTY Bilateral right 1995;  left [place]  06-29-2002   dr humphries  @[facility]$    TRANSURETHRAL RESECTION OF PROSTATE N/A 07/06/2018   Procedure: TRANSURETHRAL RESECTION OF THE PROSTATE (TURP);  Surgeon: Franchot Gallo, MD;  Location: [facility];  Service: Urology;  Laterality: N/A;  78 MINS   Patient Active Problem List   Diagnosis Date Noted   Atrial fibrillation ([place]) 05/30/2022   Presence of Watchman left atrial appendage closure device 05/30/2022   Hematuria 01/04/2022   Gross hematuria 01/04/2022   Secondary hypercoagulable state ([place]) 03/28/2020   Malignant neoplasm of prostate ([place]) 09/11/2018   Enlarged prostate with urinary obstruction 07/06/2018   Gastrocnemius strain, right, initial encounter 12/10/2017   Baker's cyst of knee, right 12/10/2017   Obesity 03/24/2017   Paroxysmal atrial fibrillation ([place]) 12/05/2016   Near syncope 04/20/2016   Leg hematoma, left, initial encounter 04/20/2016   Bleeding    Ecchymosis    Left hamstring muscle strain    Muscle tear    RBBB 0000000   Chronic systolic dysfunction of left ventricle 07/11/2014   OSA (obstructive sleep apnea) 04/07/2013   Multinodular goiter 01/20/2013   Cervical spondylosis without myelopathy 01/18/2013   Left ventricular dysfunction 12/17/2011   Low back pain 09/11/2011    Fatigue 05/22/2011   PVC (premature ventricular contraction) 01/18/2011   Persistent atrial fibrillation ([place]) 01/12/2011   Hyperlipidemia 08/02/2010   Hypertension 08/02/2010   Osteoarthritis 08/02/2010   BPH (benign prostatic hyperplasia) 08/02/2010   GERD (gastroesophageal reflux disease) 08/02/2010   h/o Atrial flutter ([place]) s/p ablation 2012     PCP: Copland, MD  REFERRING PROVIDER: Copland, MD  REFERRING DIAG: Debility  Rationale for Evaluation and Treatment: Rehabilitation  THERAPY DIAG:  Muscle weakness (generalized)  Difficulty in walking, not elsewhere classified  Unsteadiness on feet  ONSET DATE: August 2023  SUBJECTIVE:  SUBJECTIVE STATEMENT: When I asked patient today goals to get more information on what he wants to do, he was reports go back to the gym and he reports that it has been two years since [place] he has a house at [employer], he reports fear of falling and difficulty walking on sand PAIN: has knee and hip issues with DJD  PRECAUTIONS: None  WEIGHT BEARING RESTRICTIONS: No  FALLS:  Has patient fallen in last 6 months? No  LIVING ENVIRONMENT: Lives with: lives with their family Lives in: House/apartment Stairs: Yes: Internal: 12 steps; can reach both Has following equipment at home: None  OCCUPATION: retired  PLOF: Independent  was working out at [employer] 2x/week  PATIENT GOALS: be stronger, move better, return to the gym  NEXT MD VISIT:   OBJECTIVE:   DIAGNOSTIC FINDINGS:  none  COGNITION: Overall cognitive status: Within functional limits for tasks assessed     SENSATION: WFL  MUSCLE LENGTH: Hamstrings: Right 45 deg; Left 45 deg POSTURE: rounded shoulders and forward head  PALPATION: Non  tender  LUMBAR ROM: decreased 50% due to stiffness  LOWER EXTREMITY ROM:     Active  Right eval Left eval  Hip flexion    Hip extension    Hip abduction    Hip adduction    Hip internal rotation    Hip external rotation    Knee flexion    Knee extension 10 15  Ankle dorsiflexion    Ankle plantarflexion    Ankle inversion    Ankle eversion     (Blank rows = not tested)  LOWER EXTREMITY MMT:    MMT Right eval Left eval  Hip flexion 3+ 3+  Hip extension 4- 4-  Hip abduction 3+ 3+  Hip adduction    Hip internal rotation    Hip external rotation    Knee flexion 4 4  Knee extension 3+ 3+  Ankle dorsiflexion 4 4  Ankle plantarflexion 3+ 3+  Ankle inversion    Ankle eversion     (Blank rows = not tested)   FUNCTIONAL TESTS:  5 times sit to stand: 25 seconds Timed up and go (TUG): 22  seconds 6 minute walk test:  had to stop at 3 minutes at 540', got very SOB and was unsteady   TUG 06/10/22 16 seconds   6 minute walk test 06/10/22 = 800 feet with SOB and one standing rest break  5XSTS 06/10/22 20 seconds BERG 43/56 = 06/19/22 GAIT: Distance walked: 100' Assistive device utilized: None Level of assistance: Complete Independence Comments: slow, WBOS, stairs one at a time  TODAY'S TREATMENT:                                                                                                                              DATE:  06/19/22 Nustep level 5 x 6 minutes UBE level 4 x  4 minutes Leg curls 35# 3x10 Leg extension 5# 3x10 Seated row25# 3x10 Lats 25# 3x10 Gait around the parking island in the back 3 instances of instability, he was able to catch himself but reported legs very tired and weak Berg 43/56  06/17/22 Nustep level 5 x 6 minutes High level balance activties, on airex 6" and 8" toe touches, side step on and off airex, side stepping ball toss, fwd walking ball toss, on large mat figure 8's, stepping over objects, obstacle course and walking across foam mat  without changing gait speed UBE level 4 x 5 minutes Passive stretch to the HS and piriformis  06/13/22 Nustep level 5 x 6 minutes Cone toe touches solid surface and on airex 2.5# marches, hip abduction and extension 2x10 On airex balance beam side stepping Bike level 4 x 6 minutes Leg curls 35# 3x10 Leg extension 5# 3x10 UBE level 3 x 6 minutes  06/10/22 Nustep level 5 x 6 minutes Leg curls 25# 3x10 Leg extension 5# 3x10 Volleyball 10# straight arm pulls Bike level 5 x 5 minutes UBE level 3 x 5 minutes Feet on ball K2C, trunk rotation and isometric abs LE stretches  05/22/22 UBE level 4 x 6 mintues Nustep level 5 x 6 minutes Volleyball LEg curls 35# 3x10 Leg extension 5# 3x10 Seated row 25# LAts 25# 3x10 10# straight arm pulls 2x10 Passive stretch LE's  05/20/22 Bike level 4 x 6 minutes Calf stretches Seated rows 25# 3x10 Lats 25# 3x10 Chest press 10# 3x10 difficult for him Side step on and off airex On airex ball toss Leg curls 25# 3x10 Leg extension 5# 3x10 Passive stretch LE's  05/15/22 UBE level 4 x 5 minutes Bike level 4 x 6 minutes Seated row 25# Lats 25# Chest press 10# On airex cone toe touches and ball toss Biceps 5# Triceps 25# Green tband clamshells Bridges Feet on ball K2C, bridges and isometric abs Passive stretch hS and piriformis  PATIENT EDUCATION:  Education details: POC Person educated: Patient Education method: Explanation Education comprehension: verbalized understanding  HOME EXERCISE PROGRAM: TBD  ASSESSMENT:  CLINICAL IMPRESSION: Talked with patient today about goals and his issues and wants/needs, he would like to be able to walk further and tolerate activity better, c/o leg fatigue, and fear with some balance especially on sand and uneven terrain  OBJECTIVE IMPAIRMENTS: Abnormal gait, cardiopulmonary status limiting activity, decreased activity tolerance, decreased balance, decreased endurance, decreased mobility,  difficulty walking, decreased ROM, decreased strength, and impaired flexibility.   REHAB POTENTIAL: Good  CLINICAL DECISION MAKING: Stable/uncomplicated  EVALUATION COMPLEXITY: Low   GOALS: Goals reviewed with patient? Yes  SHORT TERM GOALS: Target date: 04/22/22  Independent with initial HEP Goal status: met  LONG TERM GOALS: Target date: 07/03/22  Understand posture and body mechanics Goal status: progressing  2.  Decrease TUG time to 13 seconds Goal status: progressing  3.  Decrease 5x STS to 15 seconds Goal status: progressing  4.  Increase 6 minute walk test to 1200 feet Goal status:progressing  5.  Return safely to the gym Goal status: progressing  PLAN:  PT FREQUENCY: 1-2x/week  PT DURATION: 12 weeks  PLANNED INTERVENTIONS: Therapeutic exercises, Therapeutic activity, Neuromuscular re-education, Balance training, Gait training, Patient/Family education, Self Care, Joint mobilization, Stair training, and Manual therapy.  PLAN FOR NEXT SESSION:  will add some higher level balance activities to his HEP   Sumner Boast, PT 06/19/2022, 9:27 AM

## 2022-06-21 DIAGNOSIS — R8271 Bacteriuria: Secondary | ICD-10-CM | POA: Diagnosis not present

## 2022-06-21 DIAGNOSIS — H353132 Nonexudative age-related macular degeneration, bilateral, intermediate dry stage: Secondary | ICD-10-CM | POA: Diagnosis not present

## 2022-06-24 ENCOUNTER — Encounter: Payer: Self-pay | Admitting: Physical Therapy

## 2022-06-24 ENCOUNTER — Ambulatory Visit: Payer: Medicare PPO | Admitting: Physical Therapy

## 2022-06-24 DIAGNOSIS — R2681 Unsteadiness on feet: Secondary | ICD-10-CM | POA: Diagnosis not present

## 2022-06-24 DIAGNOSIS — M6281 Muscle weakness (generalized): Secondary | ICD-10-CM

## 2022-06-24 DIAGNOSIS — R262 Difficulty in walking, not elsewhere classified: Secondary | ICD-10-CM

## 2022-06-24 DIAGNOSIS — R252 Cramp and spasm: Secondary | ICD-10-CM | POA: Diagnosis not present

## 2022-06-24 NOTE — Therapy (Signed)
OUTPATIENT PHYSICAL THERAPY THORACOLUMBAR TREATMENT    Patient Name: Connor [name] MRN: [id number] DOB:[birth year], 89 y.o., male Today's Date: 06/24/2022  END OF SESSION:  PT End of Session - 06/24/22 0844     Visit Number 15    Number of Visits 22    Date for PT Re-Evaluation 07/03/22    Authorization Type [employer]    Authorization Time Period 5/10    PT Start Time 0845    PT Stop Time 1025    PT Time Calculation (min) 100 min    Activity Tolerance Patient tolerated treatment well    Behavior During Therapy WFL for tasks assessed/performed             Past Medical History:  Diagnosis Date   Benign localized prostatic hyperplasia with lower urinary tract symptoms (LUTS)    urologist-- dr Diona Fanti   [facility])    prostate   Chronic anticoagulation    Xarelto for afib   Chronic constipation    Chronic cystitis    Gross hematuria    History of DVT of lower extremity 06/08/2010   right femoral dvt dx post op total hip 05-08-2010   History of gastric ulcer    remote   History of kidney stones    one stone. remote hx   Hypercholesteremia    Hyperlipidemia    Hypertension    Dx by Dr. Lance Muss around age  19    Macular degeneration    dry   NICM (nonischemic cardiomyopathy) ([place])    per echo 06-11-2017,  ef 45-50% with diffuse hypokinesis,  G1DD   OSA on CPAP 2017   compliant with CPAP.  managed by Dr Elsworth Soho.   Osteoarthritis    Other urethral stricture, male, meatal    chronic;  pt self dilates   Paroxysmal atrial fibrillation [facility]) cardiologist-- dr Rayann Heman   first dx 2009/   a. documented by Dr Barrett Shell on office EKG 9/12. b. maintained on Tikosyn and Xarelto. c. s/p DCCV 's.  d.  x3  EP w/ ablation atrial fib last one 12-05-2016   Prediabetes    Premature ventricular contraction    Presence of Watchman left atrial appendage closure device 05/30/2022   Watchman FLX 72m by Dr. LQuentin Ore  PVC's (premature ventricular contractions)    RA (rheumatoid  arthritis) ([facility]    rheumatologist--- Dr APage Spiro  treated with Humira   RBBB (right bundle branch block)    Hx of a fib, ablation Aug 2018   Rheumatoid arthritis ([place]    Vitamin D deficiency    Wears hearing aid in both ears    Past Surgical History:  Procedure Laterality Date   ABLATION OF DYSRHYTHMIC FOCUS  08/29/2015   ATRIAL FIBRILLATION ABLATION  12/05/2016   ATRIAL FIBRILLATION ABLATION N/A 12/05/2016   Procedure: Atrial Fibrillation Ablation;  Surgeon: AThompson Grayer MD;  [facility];  Service: Cardiovascular;  Laterality: N/A;   ATRIAL FIBRILLATION ABLATION N/A 02/29/2020   Procedure: ATRIAL FIBRILLATION ABLATION;  Surgeon: AThompson Grayer MD;  [facility];  Service: Cardiovascular;  Laterality: N/A;   ATRIAL FLUTTER ABLATION  09/2010   by JGlenford BayleyRight 1990s   CARDIOVERSION N/A 10/28/2012   Procedure: CARDIOVERSION;  Surgeon: CBurnell Blanks MD;  [facility]  Service: Cardiovascular;  Laterality: N/A;   CATARACT EXTRACTION W/ INTRAOCULAR LENS  IMPLANT, BILATERAL  2019   CYSTOSCOPY WITH BIOPSY N/A 09/03/2018   Procedure: CYSTOSCOPY WITH  FULGURATION [id number];  Surgeon: Franchot Gallo, MD;  Location: [facility];  Service: Urology;  Laterality: N/A;  [place] N/A 01/23/2022   Procedure: CYSTOSCOPY WITH FULGERATION OF PROSTATIC URETHRA;  Surgeon: Franchot Gallo, MD;  Location: [facility];  Service: Urology;  Laterality: N/A;  [place] N/A 08/29/2015   Procedure: Atrial Fibrillation Ablation;  Surgeon: [name], MD;  Location: [place] [facility];  Service: Cardiovascular;  Laterality: N/A;   KNEE SURGERY Bilateral x3   1960s & 1970s   open   LEFT ATRIAL APPENDAGE OCCLUSION N/A 05/30/2022   Procedure: LEFT ATRIAL APPENDAGE OCCLUSION;  Surgeon: Vickie Epley, MD;  Location: [place] CV LAB;  Service: Cardiovascular;  Laterality: N/A;    LEFT HEART CATHETERIZATION WITH CORONARY ANGIOGRAM N/A 06/07/2011   Procedure: LEFT HEART CATHETERIZATION WITH CORONARY ANGIOGRAM;  Surgeon: Peter M Martinique, MD;  Location: [facility] CATH LAB;  Service: Cardiovascular;  Laterality: N/A;    Normal coronary arteries,  low normal LVF   TEE WITHOUT CARDIOVERSION N/A 05/30/2022   Procedure: TRANSESOPHAGEAL ECHOCARDIOGRAM (TEE);  Surgeon: Vickie Epley, MD;  Location: [place] CV LAB;  Service: Cardiovascular;  Laterality: N/A;   TONSILLECTOMY AND ADENOIDECTOMY  age 74   TOTAL HIP ARTHROPLASTY Right 06/04/10    dr Noemi Chapel  @[facility]$    TOTAL KNEE ARTHROPLASTY Bilateral right 1995;  left 1997   TRANSURETHRAL RESECTION OF PROSTATE  06-29-2002   dr humphries  @[facility]$    TRANSURETHRAL RESECTION OF PROSTATE N/A 07/06/2018   Procedure: TRANSURETHRAL RESECTION OF THE PROSTATE (TURP);  Surgeon: Franchot Gallo, MD;  Location: [facility];  Service: Urology;  Laterality: N/A;  34 MINS   Patient Active Problem List   Diagnosis Date Noted   Atrial fibrillation ([place]) 05/30/2022   Presence of Watchman left atrial appendage closure device 05/30/2022   Hematuria 01/04/2022   Gross hematuria 01/04/2022   Secondary hypercoagulable state ([place]) 03/28/2020   Malignant neoplasm of prostate ([place]) 09/11/2018   Enlarged prostate with urinary obstruction 07/06/2018   Gastrocnemius strain, right, initial encounter 12/10/2017   Baker's cyst of knee, right 12/10/2017   Obesity 03/24/2017   Paroxysmal atrial fibrillation ([place]) 12/05/2016   Near syncope 04/20/2016   Leg hematoma, left, initial encounter 04/20/2016   Bleeding    Ecchymosis    Left hamstring muscle strain    Muscle tear    RBBB 0000000   Chronic systolic dysfunction of left ventricle 07/11/2014   OSA (obstructive sleep apnea) 04/07/2013   Multinodular goiter 01/20/2013   Cervical spondylosis without myelopathy 01/18/2013   Left ventricular dysfunction 12/17/2011   Low back pain 09/11/2011    Fatigue 05/22/2011   PVC (premature ventricular contraction) 01/18/2011   Persistent atrial fibrillation ([place]) 01/12/2011   Hyperlipidemia 08/02/2010   Hypertension 08/02/2010   Osteoarthritis 08/02/2010   BPH (benign prostatic hyperplasia) 08/02/2010   GERD (gastroesophageal reflux disease) 08/02/2010   h/o Atrial flutter ([place]) s/p ablation 2012     PCP: Copland, MD  REFERRING PROVIDER: Copland, MD  REFERRING DIAG: Debility  Rationale for Evaluation and Treatment: Rehabilitation  THERAPY DIAG:  Muscle weakness (generalized)  Difficulty in walking, not elsewhere classified  Unsteadiness on feet  Cramp and spasm  ONSET DATE: August 2023  SUBJECTIVE:  SUBJECTIVE STATEMENT: Reports leg fatigue and some left knee pain PAIN: has knee and hip issues with DJD  PRECAUTIONS: None  WEIGHT BEARING RESTRICTIONS: No  FALLS:  Has patient fallen in last 6 months? No  LIVING ENVIRONMENT: Lives with: lives with their family Lives in: House/apartment Stairs: Yes: Internal: 12 steps; can reach both Has following equipment at home: None  OCCUPATION: retired  PLOF: Independent  was working out at [employer] 2x/week  PATIENT GOALS: be stronger, move better, return to the gym  NEXT MD VISIT:   OBJECTIVE:   DIAGNOSTIC FINDINGS:  none  COGNITION: Overall cognitive status: Within functional limits for tasks assessed     SENSATION: WFL  MUSCLE LENGTH: Hamstrings: Right 45 deg; Left 45 deg POSTURE: rounded shoulders and forward head  PALPATION: Non tender  LUMBAR ROM: decreased 50% due to stiffness  LOWER EXTREMITY ROM:     Active  Right eval Left eval  Hip flexion    Hip extension    Hip abduction    Hip adduction    Hip internal rotation    Hip external rotation    Knee  flexion    Knee extension 10 15  Ankle dorsiflexion    Ankle plantarflexion    Ankle inversion    Ankle eversion     (Blank rows = not tested)  LOWER EXTREMITY MMT:    MMT Right eval Left eval  Hip flexion 3+ 3+  Hip extension 4- 4-  Hip abduction 3+ 3+  Hip adduction    Hip internal rotation    Hip external rotation    Knee flexion 4 4  Knee extension 3+ 3+  Ankle dorsiflexion 4 4  Ankle plantarflexion 3+ 3+  Ankle inversion    Ankle eversion     (Blank rows = not tested)   FUNCTIONAL TESTS:  5 times sit to stand: 25 seconds Timed up and go (TUG): 22  seconds 6 minute walk test:  had to stop at 3 minutes at 540', got very SOB and was unsteady   TUG 06/10/22 16 seconds   6 minute walk test 06/10/22 = 800 feet with SOB and one standing rest break  5XSTS 06/10/22 20 seconds BERG 43/56 = 06/19/22 GAIT: Distance walked: 100' Assistive device utilized: None Level of assistance: Complete Independence Comments: slow, WBOS, stairs one at a time  TODAY'S TREATMENT:                                                                                                                              DATE:  06/24/22 Nustep level 6 x 6 mintues Bike level 5 x 6 minutes Side step on and off airex,  Volley ball and then volleyball on the airex Walking figure 8's on mat Walking trying to not change speeds across the mat On airex cone toe and hand touches 2.5# hip abduction Leg curls 25# 3x10 Feet on ball K2C, trunk rotation, small bridge and isometric abs  06/19/22 Nustep level 5 x 6 minutes UBE level 4 x 4 minutes Leg curls 35# 3x10 Leg extension 5# 3x10 Seated row25# 3x10 Lats 25# 3x10 Gait around the parking island in the back 3 instances of instability, he was able to catch himself but reported legs very tired and weak Berg 43/56  06/17/22 Nustep level 5 x 6 minutes High level balance activties, on airex 6" and 8" toe touches, side step on and off airex, side stepping ball toss,  fwd walking ball toss, on large mat figure 8's, stepping over objects, obstacle course and walking across foam mat without changing gait speed UBE level 4 x 5 minutes Passive stretch to the HS and piriformis  06/13/22 Nustep level 5 x 6 minutes Cone toe touches solid surface and on airex 2.5# marches, hip abduction and extension 2x10 On airex balance beam side stepping Bike level 4 x 6 minutes Leg curls 35# 3x10 Leg extension 5# 3x10 UBE level 3 x 6 minutes  06/10/22 Nustep level 5 x 6 minutes Leg curls 25# 3x10 Leg extension 5# 3x10 Volleyball 10# straight arm pulls Bike level 5 x 5 minutes UBE level 3 x 5 minutes Feet on ball K2C, trunk rotation and isometric abs LE stretches  05/22/22 UBE level 4 x 6 mintues Nustep level 5 x 6 minutes Volleyball LEg curls 35# 3x10 Leg extension 5# 3x10 Seated row 25# LAts 25# 3x10 10# straight arm pulls 2x10 Passive stretch LE's  05/20/22 Bike level 4 x 6 minutes Calf stretches Seated rows 25# 3x10 Lats 25# 3x10 Chest press 10# 3x10 difficult for him Side step on and off airex On airex ball toss Leg curls 25# 3x10 Leg extension 5# 3x10 Passive stretch LE's    PATIENT EDUCATION:  Education details: POC Person educated: Patient Education method: Explanation Education comprehension: verbalized understanding  HOME EXERCISE PROGRAM: TBD  ASSESSMENT:  CLINICAL IMPRESSION:  he would like to be able to walk further and tolerate activity better, c/o leg fatigue, and fear with some balance especially on sand and uneven terrain.  He definitely struggles with walking on the mat and needs CGA, tends to not pick feet up fully and will catch his toe stepping onto the mat and the airex pad  OBJECTIVE IMPAIRMENTS: Abnormal gait, cardiopulmonary status limiting activity, decreased activity tolerance, decreased balance, decreased endurance, decreased mobility, difficulty walking, decreased ROM, decreased strength, and impaired flexibility.    REHAB POTENTIAL: Good  CLINICAL DECISION MAKING: Stable/uncomplicated  EVALUATION COMPLEXITY: Low   GOALS: Goals reviewed with patient? Yes  SHORT TERM GOALS: Target date: 04/22/22  Independent with initial HEP Goal status: met  LONG TERM GOALS: Target date: 07/03/22  Understand posture and body mechanics Goal status: progressing  2.  Decrease TUG time to 13 seconds Goal status: progressing  3.  Decrease 5x STS to 15 seconds Goal status: progressing  4.  Increase 6 minute walk test to 1200 feet Goal status:progressing  5.  Return safely to the gym Goal status: progressing  PLAN:  PT FREQUENCY: 1-2x/week  PT DURATION: 12 weeks  PLANNED INTERVENTIONS: Therapeutic exercises, Therapeutic activity, Neuromuscular re-education, Balance training, Gait training, Patient/Family education, Self Care, Joint mobilization, Stair training, and Manual therapy.  PLAN FOR NEXT SESSION:  will add some higher level balance activities to his HEP   Sumner Boast, PT 06/24/2022, 8:45 AM

## 2022-06-25 ENCOUNTER — Ambulatory Visit (INDEPENDENT_AMBULATORY_CARE_PROVIDER_SITE_OTHER): Payer: Medicare PPO | Admitting: *Deleted

## 2022-06-25 DIAGNOSIS — Z0000 Encounter for general adult medical examination without abnormal findings: Secondary | ICD-10-CM

## 2022-06-25 NOTE — Patient Instructions (Signed)
[name] , Thank you for taking time to come for your Medicare Wellness Visit. I appreciate your ongoing commitment to your health goals. Please review the following plan we discussed and let me know if I can assist you in the future.   These are the goals we discussed:  Goals      Increase physical activity        This is a list of the screening recommended for you and due dates:  Health Maintenance  Topic Date Due   COVID-19 Vaccine (7 - 2023-24 season) 04/03/2022   Medicare Annual Wellness Visit  06/26/2023   DTaP/Tdap/Td vaccine (2 - Td or Tdap) 11/09/2026   Pneumonia Vaccine  Completed   Flu Shot  Completed   Zoster (Shingles) Vaccine  Completed   HPV Vaccine  Aged Out     Next appointment: Follow up in one year for your annual wellness visit.   Preventive Care 50 Years and Older, Male Preventive care refers to lifestyle choices and visits with your health care provider that can promote health and wellness. What does preventive care include? A yearly physical exam. This is also called an annual well check. Dental exams once or twice a year. Routine eye exams. Ask your health care provider how often you should have your eyes checked. Personal lifestyle choices, including: Daily care of your teeth and gums. Regular physical activity. Eating a healthy diet. Avoiding tobacco and drug use. Limiting alcohol use. Practicing safe sex. Taking low doses of aspirin every day. Taking vitamin and mineral supplements as recommended by your health care provider. What happens during an annual well check? The services and screenings done by your health care provider during your annual well check will depend on your age, overall health, lifestyle risk factors, and family history of disease. Counseling  Your health care provider may ask you questions about your: Alcohol use. Tobacco use. Drug use. Emotional well-being. Home and relationship well-being. Sexual activity. Eating  habits. History of falls. Memory and ability to understand (cognition). Work and work Statistician. Screening  You may have the following tests or measurements: Height, weight, and BMI. Blood pressure. Lipid and cholesterol levels. These may be checked every 5 years, or more frequently if you are over 59 years old. Skin check. Lung cancer screening. You may have this screening every year starting at age 57 if you have a 30-pack-year history of smoking and currently smoke or have quit within the past 15 years. Fecal occult blood test (FOBT) of the stool. You may have this test every year starting at age 58. Flexible sigmoidoscopy or colonoscopy. You may have a sigmoidoscopy every 5 years or a colonoscopy every 10 years starting at age 28. Prostate cancer screening. Recommendations will vary depending on your family history and other risks. Hepatitis C blood test. Hepatitis B blood test. Sexually transmitted disease (STD) testing. Diabetes screening. This is done by checking your blood sugar (glucose) after you have not eaten for a while (fasting). You may have this done every 1-3 years. Abdominal aortic aneurysm (AAA) screening. You may need this if you are a current or former smoker. Osteoporosis. You may be screened starting at age 67 if you are at high risk. Talk with your health care provider about your test results, treatment options, and if necessary, the need for more tests. Vaccines  Your health care provider may recommend certain vaccines, such as: Influenza vaccine. This is recommended every year. Tetanus, diphtheria, and acellular pertussis (Tdap, Td) vaccine. You  may need a Td booster every 10 years. Zoster vaccine. You may need this after age 50. Pneumococcal 13-valent conjugate (PCV13) vaccine. One dose is recommended after age 74. Pneumococcal polysaccharide (PPSV23) vaccine. One dose is recommended after age 35. Talk to your health care provider about which screenings and  vaccines you need and how often you need them. This information is not intended to replace advice given to you by your health care provider. Make sure you discuss any questions you have with your health care provider. Document Released: 05/19/2015 Document Revised: 01/10/2016 Document Reviewed: 02/21/2015 Elsevier Interactive Patient Education  2017 [place] Prevention in the Home Falls can cause injuries. They can happen to people of all ages. There are many things you can do to make your home safe and to help prevent falls. What can I do on the outside of my home? Regularly fix the edges of walkways and driveways and fix any cracks. Remove anything that might make you trip as you walk through a door, such as a raised step or threshold. Trim any bushes or trees on the path to your home. Use bright outdoor lighting. Clear any walking paths of anything that might make someone trip, such as rocks or tools. Regularly check to see if handrails are loose or broken. Make sure that both sides of any steps have handrails. Any raised decks and porches should have guardrails on the edges. Have any leaves, snow, or ice cleared regularly. Use sand or salt on walking paths during winter. Clean up any spills in your garage right away. This includes oil or grease spills. What can I do in the bathroom? Use night lights. Install grab bars by the toilet and in the tub and shower. Do not use towel bars as grab bars. Use non-skid mats or decals in the tub or shower. If you need to sit down in the shower, use a plastic, non-slip stool. Keep the floor dry. Clean up any water that spills on the floor as soon as it happens. Remove soap buildup in the tub or shower regularly. Attach bath mats securely with double-sided non-slip rug tape. Do not have throw rugs and other things on the floor that can make you trip. What can I do in the bedroom? Use night lights. Make sure that you have a light by your  bed that is easy to reach. Do not use any sheets or blankets that are too big for your bed. They should not hang down onto the floor. Have a firm chair that has side arms. You can use this for support while you get dressed. Do not have throw rugs and other things on the floor that can make you trip. What can I do in the kitchen? Clean up any spills right away. Avoid walking on wet floors. Keep items that you use a lot in easy-to-reach places. If you need to reach something above you, use a strong step stool that has a grab bar. Keep electrical cords out of the way. Do not use floor polish or wax that makes floors slippery. If you must use wax, use non-skid floor wax. Do not have throw rugs and other things on the floor that can make you trip. What can I do with my stairs? Do not leave any items on the stairs. Make sure that there are handrails on both sides of the stairs and use them. Fix handrails that are broken or loose. Make sure that handrails are as long as the  stairways. Check any carpeting to make sure that it is firmly attached to the stairs. Fix any carpet that is loose or worn. Avoid having throw rugs at the top or bottom of the stairs. If you do have throw rugs, attach them to the floor with carpet tape. Make sure that you have a light switch at the top of the stairs and the bottom of the stairs. If you do not have them, ask someone to add them for you. What else can I do to help prevent falls? Wear shoes that: Do not have high heels. Have rubber bottoms. Are comfortable and fit you well. Are closed at the toe. Do not wear sandals. If you use a stepladder: Make sure that it is fully opened. Do not climb a closed stepladder. Make sure that both sides of the stepladder are locked into place. Ask someone to hold it for you, if possible. Clearly mark and make sure that you can see: Any grab bars or handrails. First and last steps. Where the edge of each step is. Use tools that  help you move around (mobility aids) if they are needed. These include: Canes. Walkers. Scooters. Crutches. Turn on the lights when you go into a dark area. Replace any light bulbs as soon as they burn out. Set up your furniture so you have a clear path. Avoid moving your furniture around. If any of your floors are uneven, fix them. If there are any pets around you, be aware of where they are. Review your medicines with your doctor. Some medicines can make you feel dizzy. This can increase your chance of falling. Ask your doctor what other things that you can do to help prevent falls. This information is not intended to replace advice given to you by your health care provider. Make sure you discuss any questions you have with your health care provider. Document Released: 02/16/2009 Document Revised: 09/28/2015 Document Reviewed: 05/27/2014 Elsevier Interactive Patient Education  2017 [employer].

## 2022-06-25 NOTE — Progress Notes (Signed)
Subjective:  Pt completed ADLs, Fall risk, & SDOH during e-check in on 06/18/22.  Answers verified with pt.    Connor [name] is a 89 y.o. male who presents for Medicare Annual/Subsequent preventive examination.  I connected with  Connor [name] on 06/25/22 by a audio enabled telemedicine application and verified that I am speaking with the correct person using two identifiers.  Patient Location: Home  Provider Location: Office/Clinic  I discussed the limitations of evaluation and management by telemedicine. The patient expressed understanding and agreed to proceed.   Review of Systems    Defer to PCP Cardiac Risk Factors include: advanced age (>27mn, >>68women);male gender;dyslipidemia;hypertension     Objective:    There were no vitals filed for this visit. There is no height or weight on file to calculate BMI.     06/25/2022   11:05 AM 05/30/2022    3:36 PM 05/30/2022    7:44 AM 04/08/2022    8:41 AM 01/23/2022   11:38 AM 01/22/2022    9:21 AM 01/04/2022    6:53 PM  Advanced Directives  Does Patient Have a Medical Advance Directive? Yes No No No  Yes   Type of AParamedicof AFarm[place]Living will    [place]  Does patient want to make changes to medical advance directive? No - Patient declined        Copy of [place] Chart? No - copy requested     No - copy requested   Would patient like information on creating a medical advance directive?  No - Patient declined No - Patient declined No - Patient declined Yes (Inpatient - patient requests chaplain consult to create a medical advance directive)  No - Patient declined    Current Medications (verified) Outpatient Encounter Medications as of 06/25/2022  Medication Sig   acetaminophen (TYLENOL) 500 MG tablet Take 1,000 mg by mouth 2 (two) times daily as needed for moderate pain.   atorvastatin (LIPITOR) 10 MG tablet  TAKE 1 TABLET BY MOUTH EVERY DAY (Patient taking differently: Take 10 mg by mouth every evening.)   Carboxymethylcellulose Sodium (THERATEARS OP) Apply 1 drop to eye daily as needed (dry eyes).   Cholecalciferol (VITAMIN D3) 5000 UNITS TABS Take 5,000 Units by mouth every morning.   Cranberry 1000 MG CAPS Take 4,000 mg by mouth 2 (two) times daily.   finasteride (PROSCAR) 5 MG tablet Take 5 mg by mouth every morning.    HUMIRA PEN 40 MG/0.4ML PNKT Inject 40 mg as directed every 7 (seven) days. Every Tuesday   Iron Combinations (CHROMAGEN) capsule Take 1 capsule by mouth daily.   lisinopril (ZESTRIL) 40 MG tablet TAKE 1 TABLET BY MOUTH EVERY DAY   Melatonin 5 MG CHEW Chew 15 Pieces of gum by mouth at bedtime. gummy   metoprolol succinate (TOPROL-XL) 50 MG 24 hr tablet Take 1 tablet (50 mg total) by mouth in the morning and at bedtime. Take with or immediately following a meal.   Multiple Vitamins-Minerals (PRESERVISION AREDS 2) CAPS Take 1 capsule by mouth 2 (two) times a day.   omeprazole (PRILOSEC) 20 MG capsule Take 20 mg by mouth daily.   rivaroxaban (XARELTO) 20 MG TABS tablet Take 20 mg by mouth every evening.   senna (SENOKOT) 8.6 MG TABS tablet Take 2 tablets by mouth at bedtime. Per patient taking 2 tablets daily   solifenacin (VESICARE) 10 MG  tablet Take 10 mg by mouth at bedtime.    tamsulosin (FLOMAX) 0.4 MG CAPS capsule Take 1 capsule (0.4 mg total) by mouth daily after supper. (Patient taking differently: Take 0.4 mg by mouth every morning.)   Testosterone 20.25 MG/ACT (1.62%) GEL Apply 1 Pump topically at bedtime. Apply to shoulders   vitamin B-12 (CYANOCOBALAMIN) 500 MCG tablet Take 500 mcg by mouth every evening.    No facility-administered encounter medications on file as of 06/25/2022.    Allergies (verified) Penicillins   History: Past Medical History:  Diagnosis Date   Benign localized prostatic hyperplasia with lower urinary tract symptoms (LUTS)    urologist-- dr  Diona Fanti   Cancer [facility])    prostate   Chronic anticoagulation    Xarelto for afib   Chronic constipation    Chronic cystitis    Gross hematuria    History of DVT of lower extremity 06/08/2010   right femoral dvt dx post op total hip 05-08-2010   History of gastric ulcer    remote   History of kidney stones    one stone. remote hx   Hypercholesteremia    Hyperlipidemia    Hypertension    Dx by Dr. Lance Muss around age  34    Macular degeneration    dry   NICM (nonischemic cardiomyopathy) ([place])    per echo 06-11-2017,  ef 45-50% with diffuse hypokinesis,  G1DD   OSA on CPAP 2017   compliant with CPAP.  managed by Dr Elsworth Soho.   Osteoarthritis    Other urethral stricture, male, meatal    chronic;  pt self dilates   Paroxysmal atrial fibrillation [facility]) cardiologist-- dr Rayann Heman   first dx 2009/   a. documented by Dr Barrett Shell on office EKG 9/12. b. maintained on Tikosyn and Xarelto. c. s/p DCCV 's.  d.  x3  EP w/ ablation atrial fib last one 12-05-2016   Prediabetes    Premature ventricular contraction    Presence of Watchman left atrial appendage closure device 05/30/2022   Watchman FLX 41m by Dr. LQuentin Ore  PVC's (premature ventricular contractions)    RA (rheumatoid arthritis) ([facility]    rheumatologist--- Dr APage Spiro  treated with Humira   RBBB (right bundle branch block)    Hx of a fib, ablation Aug 2018   [place]umatoid [place]    Vitamin D deficiency    Wears hearing aid in both ears    Past Surgical History:  Procedure Laterality Date   ABLATION OF DYSRHYTHMIC FOCUS  08/29/2015   ATRIAL FIBRILLATION ABLATION  12/05/2016   ATRIAL FIBRILLATION ABLATION N/A 12/05/2016   Procedure: Atrial Fibrillation Ablation;  Surgeon: AThompson Grayer MD;  [place];  Service: Cardiovascular;  Laterality: N/A;   ATRIAL FIBRILLATION ABLATION N/A 02/29/2020   Procedure: ATRIAL FIBRILLATION ABLATION;  Surgeon: AThompson Grayer MD;  [place];  Service:  Cardiovascular;  Laterality: N/A;   ATRIAL FLUTTER ABLATION  09/2010   by JGlenford BayleyRight 1990s   CARDIOVERSION N/A 10/28/2012   Procedure: CARDIOVERSION;  Surgeon: CBurnell Blanks MD;  [place]  Service: Cardiovascular;  Laterality: N/A;   CATARACT EXTRACTION W/ INTRAOCULAR LENS  IMPLANT, BILATERAL  2019   CYSTOSCOPY WITH BIOPSY N/A 09/03/2018   Procedure: CYSTOSCOPY WITH FULGURATION OFran Lowes  Surgeon: DFranchot Gallo MD;  Location: [facility];  Service: Urology;  Laterality: N/A;  [place] 01/23/2022   Procedure: CYSTOSCOPY WITH FULGERATION  OF PROSTATIC URETHRA;  Surgeon: Franchot Gallo, MD;  Location: [facility];  Service: Urology;  Laterality: N/A;  [place] N/A 08/29/2015   Procedure: Atrial Fibrillation Ablation;  Surgeon: [name], MD;  Location: [place] [facility] LAB;  Service: Cardiovascular;  Laterality: N/A;   KNEE SURGERY Bilateral x3   1960s & 1970s   open   LEFT ATRIAL APPENDAGE OCCLUSION N/A 05/30/2022   Procedure: LEFT ATRIAL APPENDAGE OCCLUSION;  Surgeon: Vickie Epley, MD;  Location: [place] CV LAB;  Service: Cardiovascular;  Laterality: N/A;   LEFT HEART CATHETERIZATION WITH CORONARY ANGIOGRAM N/A 06/07/2011   Procedure: LEFT HEART CATHETERIZATION WITH CORONARY ANGIOGRAM;  Surgeon: Peter M Martinique, MD;  Location: [facility] CATH LAB;  Service: Cardiovascular;  Laterality: N/A;    Normal coronary arteries,  low normal LVF   TEE WITHOUT CARDIOVERSION N/A 05/30/2022   Procedure: TRANSESOPHAGEAL ECHOCARDIOGRAM (TEE);  Surgeon: Vickie Epley, MD;  Location: [place] CV LAB;  Service: Cardiovascular;  Laterality: N/A;   TONSILLECTOMY AND ADENOIDECTOMY  age 12   TOTAL HIP ARTHROPLASTY Right 06/04/10    dr Noemi Chapel  @[facility]$    TOTAL KNEE ARTHROPLASTY Bilateral right 1995;  left [place]  06-29-2002   dr humphries  @[facility]$    TRANSURETHRAL RESECTION  OF PROSTATE N/A 07/06/2018   Procedure: TRANSURETHRAL RESECTION OF THE PROSTATE (TURP);  Surgeon: Franchot Gallo, MD;  Location: [facility];  Service: Urology;  Laterality: N/A;  58 MINS   Family History  Problem Relation Age of Onset   Heart attack Father    Lung disease Mother    Arrhythmia Sister    Atrial fibrillation Brother    Diabetes Neg Hx    Social History   Socioeconomic History   Marital status: Married    Spouse name: Joycelyn Schmid   Number of children: Not on file   Years of education: Not on file   Highest education level: Not on file  Occupational History   Occupation: Retired  Tobacco Use   Smoking status: Former    Packs/day: 1.00    Years: 30.00    Total pack years: 30.00    Types: Cigarettes    Quit date: 05/06/1978    Years since quitting: 44.1   Smokeless tobacco: Never   Tobacco comments:    Former smoker 03/14/2021  Vaping Use   Vaping Use: Never used  Substance and Sexual Activity   Alcohol use: Yes    Alcohol/week: 1.0 standard drink of alcohol    Types: 1 Cans of beer per week    Comment: occasionally wine and beer   Drug use: Never   Sexual activity: Not on file  Other Topics Concern   Not on file  Social History Narrative   he patient lives in [place] with his spouse.  Retired.   Social Determinants of Health   Financial Resource Strain: Low Risk  (06/18/2022)   Overall Financial Resource Strain (CARDIA)    Difficulty of Paying Living Expenses: Not hard at all  Food Insecurity: No Food Insecurity (06/18/2022)   Hunger Vital Sign    Worried About Running Out of Food in the Last Year: Never true    Ran Out of Food in the Last Year: Never true  Transportation Needs: No Transportation Needs (06/18/2022)   PRAPARE - Hydrologist (Medical): No    Lack of Transportation (Non-Medical): No  Physical Activity:  Insufficiently Active (06/18/2022)   Exercise Vital Sign    Days of Exercise per Week: 1  day    Minutes of Exercise per Session: 20 min  Stress: No Stress Concern Present (06/18/2022)   [place]    Feeling of Stress : Not at all  Social Connections: Moderately Integrated (06/18/2022)   Social Connection and Isolation Panel [NHANES]    Frequency of Communication with Friends and Family: More than three times a week    Frequency of Social Gatherings with Friends and Family: Three times a week    Attends Religious Services: Never    Active Member of Clubs or Organizations: Yes    Attends Archivist Meetings: 1 to 4 times per year    Marital Status: Married    Tobacco Counseling Counseling given: Not Answered Tobacco comments: Former smoker 03/14/2021   Clinical Intake:  Pre-visit preparation completed: Yes  Pain : No/denies pain  Diabetes: No  How often do you need to have someone help you when you read instructions, pamphlets, or other written materials from your doctor or pharmacy?: 1 - Never   Activities of Daily Living    06/18/2022    8:45 AM 05/30/2022    7:43 AM  In your present state of health, do you have any difficulty performing the following activities:  Hearing? 1 0  Comment wears hearing aids   Vision? 0 0  Difficulty concentrating or making decisions? 0 0  Walking or climbing stairs? 1 0  Dressing or bathing? 0 0  Doing errands, shopping? 0   Preparing Food and eating ? N   Using the Toilet? N   In the past six months, have you accidently leaked urine? N   Do you have problems with loss of bowel control? N   Managing your Medications? N   Managing your Finances? N   Housekeeping or managing your Housekeeping? N     Patient Care Team: Copland, Gay Filler, MD as PCP - General (Family Medicine) Stanford Breed Denice Bors, MD as PCP - Cardiology (Cardiology) Vickie Epley, MD as PCP - Electrophysiology (Cardiology) Elsie Saas, MD (Orthopedic Surgery)  Indicate any  recent Medical Services you may have received from other than [facility] providers in the past year (date may be approximate).     Assessment:   This is a routine wellness examination for [place].  Hearing/Vision screen No results found.  Dietary issues and exercise activities discussed: Current Exercise Habits: Home exercise routine, Type of exercise: walking, Time (Minutes): 10, Frequency (Times/Week): 3, Weekly Exercise (Minutes/Week): 30, Intensity: Mild, Exercise limited by: None identified   Goals Addressed   None    Depression Screen    06/25/2022   11:08 AM 03/18/2022   10:17 AM 09/12/2021   10:12 AM 06/21/2021    9:49 AM 12/20/2020    2:54 PM 09/07/2020   10:01 AM 06/08/2020   10:32 AM  PHQ 2/9 Scores  PHQ - 2 Score 0 0 0 0 0 0 0    Fall Risk    06/18/2022    8:45 AM 03/18/2022   10:17 AM 09/12/2021   10:12 AM 06/21/2021    9:48 AM 12/20/2020    2:54 PM  Fall Risk   Falls in the past year? 0 0 0 0 1  Number falls in past yr: 0 0 0 0 0  Injury with Fall? 0 0 0 0 1  Risk for fall due to : No  Fall Risks      Follow up Falls evaluation completed Falls evaluation completed  Falls prevention discussed Falls evaluation completed    FALL RISK PREVENTION PERTAINING TO THE HOME:  Any stairs in or around the home? Yes  If so, are there any without handrails? No  Home free of loose throw rugs in walkways, pet beds, electrical cords, etc? Yes  Adequate lighting in your home to reduce risk of falls? Yes   ASSISTIVE DEVICES UTILIZED TO PREVENT FALLS:  Life alert? No  Use of a cane, walker or w/c? No  Grab bars in the bathroom? Yes  Shower chair or bench in shower? Yes  Elevated toilet seat or a handicapped toilet?  Comfort height  TIMED UP AND GO:  Was the test performed?  No, audio visit .   Cognitive Function:        06/25/2022   11:16 AM 05/20/2019    1:31 PM  6CIT Screen  What Year? 0 points 0 points  What month? 0 points 0 points  What time? 0 points 0 points   Count back from 20 0 points 0 points  Months in reverse 0 points 0 points  Repeat phrase 0 points 0 points  Total Score 0 points 0 points    Immunizations Immunization History  Administered Date(s) Administered   Fluad Quad(high Dose 65+) 01/13/2021   Influenza Split 02/03/2013, 02/06/2022   Influenza, High Dose Seasonal PF 02/03/2017, 01/31/2020   Influenza,inj,Quad PF,6+ Mos 02/28/2015   Influenza-Unspecified 02/24/2014, 01/09/2016, 02/05/2017, 01/17/2018   PFIZER(Purple Top)SARS-COV-2 Vaccination 05/19/2019, 06/08/2019, 01/31/2020, 09/07/2020   Pfizer Covid-19 Vaccine Bivalent Booster 31yr & up 01/13/2021   Pneumococcal Conjugate-13 09/16/2013   Pneumococcal Polysaccharide-23 09/20/2015   Tdap 11/08/2016   Unspecified SARS-COV-2 Vaccination 02/06/2022   Zoster Recombinat (Shingrix) 01/19/2019, 04/21/2019   Zoster, Live 06/16/2006    TDAP status: Up to date  Flu Vaccine status: Up to date  Pneumococcal vaccine status: Up to date  Covid-19 vaccine status: Information provided on how to obtain vaccines.   Qualifies for Shingles Vaccine? Yes   Zostavax completed Yes   Shingrix Completed?: Yes  Screening Tests Health Maintenance  Topic Date Due   COVID-19 Vaccine (7 - 2023-24 season) 04/03/2022   Medicare Annual Wellness (AWV)  06/21/2022   DTaP/Tdap/Td (2 - Td or Tdap) 11/09/2026   Pneumonia Vaccine 89 Years old  Completed   INFLUENZA VACCINE  Completed   Zoster Vaccines- Shingrix  Completed   HPV VACCINES  Aged Out    Health Maintenance  Health Maintenance Due  Topic Date Due   COVID-19 Vaccine (7 - 2023-24 season) 04/03/2022   Medicare Annual Wellness (AWV)  06/21/2022    Colorectal cancer screening: No longer required.   Lung Cancer Screening: (Low Dose CT Chest recommended if Age 87-80years, 30 pack-year currently smoking OR have quit w/in 15years.) does not qualify.   Additional Screening:  Hepatitis C Screening: does not qualify  Vision  Screening: Recommended annual ophthalmology exams for early detection of glaucoma and other disorders of the eye. Is the patient up to date with their annual eye exam?  Yes  Who is the provider or what is the name of the office in which the patient attends annual eye exams? Dr. MRodman KeyIf pt is not established with a provider, would they like to be referred to a provider to establish care? No .   Dental Screening: Recommended annual dental exams for proper oral hygiene  Community Resource Referral / Chronic Care  Management: CRR required this visit?  No   CCM required this visit?  No      Plan:     I have personally reviewed and noted the following in the patient's chart:   Medical and social history Use of alcohol, tobacco or illicit drugs  Current medications and supplements including opioid prescriptions. Patient is not currently taking opioid prescriptions. Functional ability and status Nutritional status Physical activity Advanced directives List of other physicians Hospitalizations, surgeries, and ER visits in previous 12 months Vitals Screenings to include cognitive, depression, and falls Referrals and appointments  In addition, I have reviewed and discussed with patient certain preventive protocols, quality metrics, and best practice recommendations. A written personalized care plan for preventive services as well as general preventive health recommendations were provided to patient.   Due to this being a telephonic visit, the after visit summary with patients personalized plan was offered to patient via mail or my-chart. Patient would like to access on my-chart.  Beatris Ship, [place]   06/25/2022   Nurse Notes: None

## 2022-06-28 NOTE — Progress Notes (Unsigned)
[name]                                     Cardiology Office Note:    Date:  07/01/2022   ID:  Connor [name], DOB 12-Oct-1933, MRN [id number]  PCP:  Darreld Mclean, MD  [facility] Cardiologist:  Kirk Ruths, MD  [facility] Electrophysiologist:  Vickie Epley, MD   Referring MD: Darreld Mclean, MD   Chief Complaint  Patient presents with   Follow-up    S/p LAAO    History of Present Illness:    Connor [name] is a 89 y.o. male with a hx of prostate CA, provoked DVT (2012), PVCs and RBBB, A Flutter Ablation x 1, A Fib Ablation x 3, HTN, HLD, OSA on CPAP, obesity, NICM, PAF with hematuria and anemia who is now s/p Watchman and is being seen today for follow up.    Mr. Vonada has been followed for his atrial fibrillation with the [facility] and was seen 01/2022 at which time he reported a several week hx of hematuria. Hb went from 12.7 in 12/2021 to 9.2. He underwent bladder cauterization with symptom improvement. Given trouble with hematuria, he was referred to Dr. Quentin Ore for consideration of LAAO closure with Watchman in hopes to avoid long term anticoagulation.   He was seen by Dr. Quentin Ore 03/20/2022. CT imaging showed anatomy suitable for device implant and no thrombus and is now s/p LAAO closure with Watchman FLX 14m device. He was restarted on Xarelto with plans to transition to Plavix after 45 days for a total of 6 months duration.   He is here today alone and reports that he has been doing well since his procedure. He denies chest pain, palpitations, LE edema, orthopnea, SOB, dizziness, syncope, or bleeding in stool or urine.   Past Medical History:  Diagnosis Date   Benign localized prostatic hyperplasia with lower urinary tract symptoms (LUTS)    urologist-- dr dDiona Fanti  Cancer ([facility]    prostate   Chronic anticoagulation    Xarelto for afib   Chronic constipation    Chronic cystitis     Gross hematuria    History of DVT of lower extremity 06/08/2010   right femoral dvt dx post op total hip 05-08-2010   History of gastric ulcer    remote   History of kidney stones    one stone. remote hx   Hypercholesteremia    Hyperlipidemia    Hypertension    Dx by Dr. ALance Mussaround age  89   Macular degeneration    dry   [facility] [place]    per echo 06-11-2017,  ef 45-50% with diffuse hypokinesis,  G1DD   OSA on CPAP 2017   compliant with CPAP.  managed by Dr AElsworth Soho   Osteoarthritis    Other urethral stricture, male, meatal    chronic;  pt self dilates   Paroxysmal atrial fibrillation ([facility] cardiologist-- dr aRayann Heman  first dx 2009/   a. documented by Dr JBarrett Shellon office EKG 9/12. b. maintained on Tikosyn and Xarelto. c. s/p DCCV 's.  d.  x3  EP w/ ablation atrial fib last one 12-05-2016   Prediabetes    Premature ventricular contraction    Presence of Watchman left atrial appendage closure device 05/30/2022   Watchman  FLX 46m by Dr. LQuentin Ore  PVC's (premature ventricular contractions)    RA (rheumatoid arthritis) ([facility]    rheumatologist--- Dr ALoni MuseTrudie Reed  treated with Humira   RBBB (right bundle branch block)    Hx of a fib, ablation Aug 2018   Rheumatoid arthritis ([facility]    Vitamin D deficiency    Wears hearing aid in both ears     Past Surgical History:  Procedure Laterality Date   ABLATION OF DYSRHYTHMIC FOCUS  08/29/2015   ATRIAL FIBRILLATION ABLATION  12/05/2016   ATRIAL FIBRILLATION ABLATION N/A 12/05/2016   Procedure: Atrial Fibrillation Ablation;  Surgeon: AThompson Grayer MD;  [place];  Service: Cardiovascular;  Laterality: N/A;   ATRIAL FIBRILLATION ABLATION N/A 02/29/2020   Procedure: ATRIAL FIBRILLATION ABLATION;  Surgeon: AThompson Grayer MD;  [place] [facility];  Service: Cardiovascular;  Laterality: N/A;   ATRIAL FLUTTER ABLATION  09/2010   by JGlenford BayleyRight 1990s   CARDIOVERSION N/A  10/28/2012   Procedure: CARDIOVERSION;  Surgeon: CBurnell Blanks MD;  [place]  Service: Cardiovascular;  Laterality: N/A;   CATARACT EXTRACTION W/ INTRAOCULAR LENS  IMPLANT, BILATERAL  2019   CYSTOSCOPY WITH BIOPSY N/A 09/03/2018   Procedure: CYSTOSCOPY WITH FULGURATION OFran Lowes  Surgeon: DFranchot Gallo MD;  [facility];  Service: Urology;  Laterality: N/A;  [place] 01/23/2022   Procedure: CYSTOSCOPY WITH FULGERATION OF PROSTATIC URETHRA;  Surgeon: DFranchot Gallo MD;  Location[facility];  Service: Urology;  Laterality: N/A;  [place] 08/29/2015   Procedure: Atrial Fibrillation Ablation;  Surgeon: JThompson Grayer MD;  [place] [facility];  Service: Cardiovascular;  Laterality: N/A;   KNEE SURGERY Bilateral x3   1960s & 1970s   open   LEFT ATRIAL APPENDAGE OCCLUSION N/A 05/30/2022   Procedure: LEFT ATRIAL APPENDAGE OCCLUSION;  Surgeon: LVickie Epley MD;  [place] [facility];  Service: Cardiovascular;  Laterality: N/A;   LEFT HEART CATHETERIZATION WITH CORONARY ANGIOGRAM N/A 06/07/2011   Procedure: LEFT HEART CATHETERIZATION WITH CORONARY ANGIOGRAM;  Surgeon: Peter M JMartinique MD;  [facility];  Service: Cardiovascular;  Laterality: N/A;    Normal coronary arteries,  low normal LVF   TEE WITHOUT CARDIOVERSION N/A 05/30/2022   Procedure: TRANSESOPHAGEAL ECHOCARDIOGRAM (TEE);  Surgeon: LVickie Epley MD;  [place] [facility] [place];  Service: Cardiovascular;  Laterality: N/A;   TONSILLECTOMY AND ADENOIDECTOMY  age 89  TOTAL HIP ARTHROPLASTY Right 06/04/10    dr wNoemi Chapel '@[facility]'$    TOTAL KNEE ARTHROPLASTY Bilateral right 1995;  left 1997   TRANSURETHRAL RESECTION OF PROSTATE  06-29-2002   dr humphries  '@[facility]'$    TRANSURETHRAL RESECTION OF PROSTATE N/A 07/06/2018   Procedure: TRANSURETHRAL RESECTION OF THE PROSTATE (TURP);  Surgeon: DFranchot Gallo MD;   [facility]  Service: Urology;  Laterality: N/A;  45 MINS    Current Medications: Current Meds  Medication Sig   acetaminophen (TYLENOL) 500 MG tablet Take 1,000 mg by mouth 2 (two) times daily as needed for moderate pain.   atorvastatin (LIPITOR) 10 MG tablet TAKE 1 TABLET BY MOUTH EVERY DAY (Patient taking differently: Take 10 mg by mouth every evening.)   Carboxymethylcellulose Sodium (THERATEARS OP) Apply 1 drop to eye daily as needed (dry eyes).   Cholecalciferol (VITAMIN D3) 5000 UNITS TABS Take 5,000 Units by mouth  every morning.   [START ON 07/15/2022] clopidogrel (PLAVIX) 75 MG tablet Take 1 tablet (75 mg total) by mouth daily. Start on 3/11 and stop Xarelto on 3/10.   Cranberry 1000 MG CAPS Take 4,000 mg by mouth 2 (two) times daily.   finasteride (PROSCAR) 5 MG tablet Take 5 mg by mouth every morning.    HUMIRA PEN 40 MG/0.4ML PNKT Inject 40 mg as directed every 7 (seven) days. Every Tuesday   Iron Combinations (CHROMAGEN) capsule Take 1 capsule by mouth daily.   lisinopril (ZESTRIL) 40 MG tablet TAKE 1 TABLET BY MOUTH EVERY DAY   Melatonin 10 MG CHEW Chew 3 Pieces of gum by mouth at bedtime. gummy   metoprolol succinate (TOPROL-XL) 50 MG 24 hr tablet Take 1 tablet (50 mg total) by mouth in the morning and at bedtime. Take with or immediately following a meal.   Multiple Vitamins-Minerals (PRESERVISION AREDS 2) CAPS Take 1 capsule by mouth 2 (two) times a day.   omeprazole (PRILOSEC) 20 MG capsule Take 20 mg by mouth daily.   rivaroxaban (XARELTO) 20 MG TABS tablet Take 20 mg by mouth every evening. Stop on 3/10 and start Plavix 3/11   senna (SENOKOT) 8.6 MG TABS tablet Take 2 tablets by mouth at bedtime. Per patient taking 2 tablets daily   solifenacin (VESICARE) 10 MG tablet Take 10 mg by mouth at bedtime.    tamsulosin (FLOMAX) 0.4 MG CAPS capsule Take 1 capsule (0.4 mg total) by mouth daily after supper. (Patient taking differently: Take 0.4 mg by mouth  every morning.)   Testosterone 20.25 MG/ACT (1.62%) GEL Apply 1 Pump topically at bedtime. Apply to shoulders   vitamin B-12 (CYANOCOBALAMIN) 500 MCG tablet Take 500 mcg by mouth every evening.      Allergies:   Penicillins   Social History   Socioeconomic History   Marital status: Married    Spouse name: Joycelyn Schmid   Number of children: Not on file   Years of education: Not on file   Highest education level: Not on file  Occupational History   Occupation: Retired  Tobacco Use   Smoking status: Former    Packs/day: 1.00    Years: 30.00    Total pack years: 30.00    Types: Cigarettes    Quit date: 05/06/1978    Years since quitting: 44.1   Smokeless tobacco: Never   Tobacco comments:    Former smoker 03/14/2021  Vaping Use   Vaping Use: Never used  Substance and Sexual Activity   Alcohol use: Yes    Alcohol/week: 1.0 standard drink of alcohol    Types: 1 Cans of beer per week    Comment: occasionally wine and beer   Drug use: Never   Sexual activity: Not on file  Other Topics Concern   Not on file  Social History Narrative   he patient lives in [place] with his spouse.  Retired.   Social Determinants of Health   Financial Resource Strain: Low Risk  (06/18/2022)   Overall Financial Resource Strain (CARDIA)    Difficulty of Paying Living Expenses: Not hard at all  Food Insecurity: No Food Insecurity (06/18/2022)   Hunger Vital Sign    Worried About Running Out of Food in the Last Year: Never true    Ran Out of Food in the Last Year: Never true  Transportation Needs: No Transportation Needs (06/18/2022)   PRAPARE - Hydrologist (Medical): No    Lack of Transportation (  Non-Medical): No  Physical Activity: Insufficiently Active (06/18/2022)   Exercise Vital Sign    Days of Exercise per Week: 1 day    Minutes of Exercise per Session: 20 min  Stress: No Stress Concern Present (06/18/2022)   [place]    Feeling of Stress : Not at all  Social Connections: Moderately Integrated (06/18/2022)   Social Connection and Isolation Panel [NHANES]    Frequency of Communication with Friends and Family: More than three times a week    Frequency of Social Gatherings with Friends and Family: Three times a week    Attends Religious Services: Never    Active Member of Clubs or Organizations: Yes    Attends Archivist Meetings: 1 to 4 times per year    Marital Status: Married     Family History: The patient's family history includes Arrhythmia in his sister; Atrial fibrillation in his brother; Heart attack in his father; Lung disease in his mother. There is no history of Diabetes.  ROS:   Please see the history of present illness.    All other systems reviewed and are negative.  EKGs/Labs/Other Studies Reviewed:    The following studies were reviewed today:  Watchman 05/30/22:  Procedures This Admission:  Transeptal Puncture Intra-procedural TEE which showed no LAA thrombus Left atrial appendage occlusive device placement on 05/30/22 by Dr. Quentin Ore.   EKG:  EKG is not ordered today.  T  Recent Labs: 03/18/2022: ALT 14; TSH 1.30 05/27/2022: BUN 18; Creatinine, Ser 0.86; Hemoglobin 12.4; Platelets 154; Potassium 4.6; Sodium 142  Recent Lipid Panel    Component Value Date/Time   CHOL 156 03/12/2021 1004   TRIG 94.0 03/12/2021 1004   HDL 46.70 03/12/2021 1004   CHOLHDL 3 03/12/2021 1004   VLDL 18.8 03/12/2021 1004   LDLCALC 91 03/12/2021 1004   Physical Exam:    VS:  BP 122/74   Pulse 70   Ht 6' (1.829 m)   Wt 220 lb (99.8 kg)   SpO2 97%   BMI 29.84 kg/m     Wt Readings from Last 3 Encounters:  07/01/22 220 lb (99.8 kg)  06/17/22 222 lb (100.7 kg)  05/30/22 215 lb (97.5 kg)    General: Well developed, well nourished, NAD Lungs:Clear to ausculation bilaterally. No wheezes, rales, or rhonchi. Breathing is unlabored. Cardiovascular: RRR  with S1 S2. No murmurs Extremities: No edema.  Neuro: Alert and oriented. No focal deficits. No facial asymmetry. MAE spontaneously. Psych: Responds to questions appropriately with normal affect.    ASSESSMENT/PLAN:    Paroxysmal atrial fibrillation: s/p LAAO closure with Watchman FLX 74m device. He was restarted on Xarelto and will continue through 3/10 then will stop and transition to Plavix '75mg'$  QD on 07/15/22 through 6 months duration. He already takes dental SBE priro to cleanings and dental procedures. This is supplied by his PCP. CT instruction letter reviewed with understanding. Obtain BMET today. I will see him for 6 month post Watchman follow up.     Obstructive sleep apnea: Patient reports compliance with CPAP therapy.   NICM: w/ EF normalization to 50-55% on echocardiogram. Continues to do well with NYHA class I symptoms. Appears euvolemic on exam today. No changes at this time.   HTN: Stable today with no changes needed at this time  Medication Adjustments/Labs and Tests Ordered: Current medicines are reviewed at length with the patient today.  Concerns regarding medicines are outlined above.  Orders Placed This  Encounter  Procedures   Basic metabolic panel   Meds ordered this encounter  Medications   clopidogrel (PLAVIX) 75 MG tablet    Sig: Take 1 tablet (75 mg total) by mouth daily. Start on 3/11 and stop Xarelto on 3/10.    Dispense:  90 tablet    Refill:  3    Patient Instructions  Medication Instructions:  Your physician has recommended you make the following change in your medication:  STOP XARELTO ON 3/10 START PLAVIX ON 3/11  *If you need a refill on your cardiac medications before your next appointment, please call your pharmacy*   Lab Work: TODAY: BMET If you have labs (blood work) drawn today and your tests are completely normal, you will receive your results only by: [place] (if you have MyChart) OR A paper copy in the mail If you have any  lab test that is abnormal or we need to change your treatment, we will call you to review the results.   Testing/Procedures: SEE CT INSTRUCTION LETTER    Follow-Up: At [facility], you and your health needs are our priority.  As part of our continuing mission to provide you with exceptional heart care, we have created designated Provider Care Teams.  These Care Teams include your primary Cardiologist (physician) and Advanced Practice Providers (APPs -  Physician Assistants and Nurse Practitioners) who all work together to provide you with the care you need, when you need it.  We recommend signing up for the patient portal called "MyChart".  Sign up information is provided on this After Visit Summary.  MyChart is used to connect with patients for Virtual Visits (Telemedicine).  Patients are able to view lab/test results, encounter notes, upcoming appointments, etc.  Non-urgent messages can be sent to your provider as well.   To learn more about what you can do with MyChart, go to [email or web address].    Your next appointment:   KEEP SCHEDULED FOLLOW-UP   Signed, Kathyrn Drown, NP  07/01/2022 11:27 AM    [place]

## 2022-07-01 ENCOUNTER — Ambulatory Visit: Payer: Medicare PPO | Attending: Cardiology | Admitting: Cardiology

## 2022-07-01 VITALS — BP 122/74 | HR 70 | Ht 72.0 in | Wt 220.0 lb

## 2022-07-01 DIAGNOSIS — I10 Essential (primary) hypertension: Secondary | ICD-10-CM

## 2022-07-01 DIAGNOSIS — G4733 Obstructive sleep apnea (adult) (pediatric): Secondary | ICD-10-CM | POA: Diagnosis not present

## 2022-07-01 DIAGNOSIS — I480 Paroxysmal atrial fibrillation: Secondary | ICD-10-CM

## 2022-07-01 DIAGNOSIS — Z95818 Presence of other cardiac implants and grafts: Secondary | ICD-10-CM | POA: Diagnosis not present

## 2022-07-01 DIAGNOSIS — Z01818 Encounter for other preprocedural examination: Secondary | ICD-10-CM

## 2022-07-01 MED ORDER — CLOPIDOGREL BISULFATE 75 MG PO TABS: 75.0000 mg | ORAL_TABLET | Freq: Every day | ORAL | 3 refills | Status: DC

## 2022-07-01 NOTE — Patient Instructions (Signed)
Medication Instructions:  Your physician has recommended you make the following change in your medication:  STOP XARELTO ON 3/10 START PLAVIX ON 3/11  *If you need a refill on your cardiac medications before your next appointment, please call your pharmacy*   Lab Work: TODAY: BMET If you have labs (blood work) drawn today and your tests are completely normal, you will receive your results only by: [place] (if you have MyChart) OR A paper copy in the mail If you have any lab test that is abnormal or we need to change your treatment, we will call you to review the results.   Testing/Procedures: SEE CT INSTRUCTION LETTER    Follow-Up: At [facility], you and your health needs are our priority.  As part of our continuing mission to provide you with exceptional heart care, we have created designated Provider Care Teams.  These Care Teams include your primary Cardiologist (physician) and Advanced Practice Providers (APPs -  Physician Assistants and Nurse Practitioners) who all work together to provide you with the care you need, when you need it.  We recommend signing up for the patient portal called "MyChart".  Sign up information is provided on this After Visit Summary.  MyChart is used to connect with patients for Virtual Visits (Telemedicine).  Patients are able to view lab/test results, encounter notes, upcoming appointments, etc.  Non-urgent messages can be sent to your provider as well.   To learn more about what you can do with MyChart, go to [email or web address].    Your next appointment:   KEEP SCHEDULED FOLLOW-UP

## 2022-07-02 LAB — BASIC METABOLIC PANEL
BUN/Creatinine Ratio: 23 (ref 10–24)
BUN: 19 mg/dL (ref 8–27)
CO2: 25 mmol/L (ref 20–29)
Calcium: 8.9 mg/dL (ref 8.6–10.2)
Chloride: 108 mmol/L — ABNORMAL HIGH (ref 96–106)
Creatinine, Ser: 0.83 mg/dL (ref 0.76–1.27)
Glucose: 104 mg/dL — ABNORMAL HIGH (ref 70–99)
Potassium: 4.3 mmol/L (ref 3.5–5.2)
Sodium: 146 mmol/L — ABNORMAL HIGH (ref 134–144)
eGFR: 85 mL/min/{1.73_m2} (ref >59)

## 2022-07-03 ENCOUNTER — Ambulatory Visit: Payer: Medicare PPO | Admitting: Physical Therapy

## 2022-07-06 ENCOUNTER — Other Ambulatory Visit: Payer: Self-pay | Admitting: Family Medicine

## 2022-07-06 DIAGNOSIS — E782 Mixed hyperlipidemia: Secondary | ICD-10-CM

## 2022-07-06 DIAGNOSIS — I10 Essential (primary) hypertension: Secondary | ICD-10-CM

## 2022-07-08 ENCOUNTER — Other Ambulatory Visit: Payer: Self-pay | Admitting: Cardiology

## 2022-07-08 MED ORDER — PANTOPRAZOLE SODIUM 40 MG PO TBEC: 40.0000 mg | DELAYED_RELEASE_TABLET | Freq: Every day | ORAL | 2 refills | Status: DC

## 2022-07-09 ENCOUNTER — Encounter: Payer: Self-pay | Admitting: Physical Therapy

## 2022-07-09 ENCOUNTER — Ambulatory Visit: Payer: Medicare PPO | Attending: Family Medicine | Admitting: Physical Therapy

## 2022-07-09 DIAGNOSIS — R252 Cramp and spasm: Secondary | ICD-10-CM | POA: Insufficient documentation

## 2022-07-09 DIAGNOSIS — R2681 Unsteadiness on feet: Secondary | ICD-10-CM | POA: Insufficient documentation

## 2022-07-09 DIAGNOSIS — R262 Difficulty in walking, not elsewhere classified: Secondary | ICD-10-CM | POA: Diagnosis not present

## 2022-07-09 DIAGNOSIS — M6281 Muscle weakness (generalized): Secondary | ICD-10-CM | POA: Insufficient documentation

## 2022-07-09 NOTE — Therapy (Signed)
OUTPATIENT PHYSICAL THERAPY THORACOLUMBAR TREATMENT    Patient Name: Connor [name] MRN: [id number] DOB:[birth year], 89 y.o., male Today's Date: 07/09/2022  END OF SESSION:  PT End of Session - 07/09/22 1334     Visit Number 16    Number of Visits 22    Date for PT Re-Evaluation 08/09/22    Authorization Type [employer]    Authorization Time Period 6/10    PT Start Time 1310    PT Stop Time 1430    PT Time Calculation (min) 80 min    Activity Tolerance Patient tolerated treatment well    Behavior During Therapy WFL for tasks assessed/performed             Past Medical History:  Diagnosis Date   Benign localized prostatic hyperplasia with lower urinary tract symptoms (LUTS)    urologist-- dr Diona Fanti   Cancer [facility])    prostate   Chronic anticoagulation    Xarelto for afib   Chronic constipation    Chronic cystitis    Gross hematuria    History of DVT of lower extremity 06/08/2010   right femoral dvt dx post op total hip 05-08-2010   History of gastric ulcer    remote   History of kidney stones    one stone. remote hx   Hypercholesteremia    Hyperlipidemia    Hypertension    Dx by Dr. Lance Muss around age  19    Macular degeneration    dry   NICM (nonischemic cardiomyopathy) ([place])    per echo 06-11-2017,  ef 45-50% with diffuse hypokinesis,  G1DD   OSA on CPAP 2017   compliant with CPAP.  managed by Dr Elsworth Soho.   Osteoarthritis    Other urethral stricture, male, meatal    chronic;  pt self dilates   Paroxysmal atrial fibrillation [facility]) cardiologist-- dr Rayann Heman   first dx 2009/   a. documented by Dr Barrett Shell on office EKG 9/12. b. maintained on Tikosyn and Xarelto. c. s/p DCCV 's.  d.  x3  EP w/ ablation atrial fib last one 12-05-2016   Prediabetes    Premature ventricular contraction    Presence of Watchman left atrial appendage closure device 05/30/2022   Watchman FLX 59m by Dr. LQuentin Ore  PVC's (premature ventricular contractions)    RA (rheumatoid  arthritis) ([facility]    rheumatologist--- Dr APage Spiro  treated with Humira   RBBB (right bundle branch block)    Hx of a fib, ablation Aug 2018   [place]    Vitamin D deficiency    Wears hearing aid in both ears    Past Surgical History:  Procedure Laterality Date   ABLATION OF DYSRHYTHMIC FOCUS  08/29/2015   ATRIAL FIBRILLATION ABLATION  12/05/2016   ATRIAL FIBRILLATION ABLATION N/A 12/05/2016   Procedure: Atrial Fibrillation Ablation;  Surgeon: AThompson Grayer MD;  [place] [facility];  Service: Cardiovascular;  Laterality: N/A;   ATRIAL FIBRILLATION ABLATION N/A 02/29/2020   Procedure: ATRIAL FIBRILLATION ABLATION;  Surgeon: AThompson Grayer MD;  [place] [facility];  Service: Cardiovascular;  Laterality: N/A;   ATRIAL FLUTTER ABLATION  09/2010   by JGlenford BayleyRight 1990s   CARDIOVERSION N/A 10/28/2012   Procedure: CARDIOVERSION;  Surgeon: CBurnell Blanks MD;  [place]  Service: Cardiovascular;  Laterality: N/A;   CATARACT EXTRACTION W/ INTRAOCULAR LENS  IMPLANT, BILATERAL  2019   CYSTOSCOPY WITH BIOPSY N/A 09/03/2018   Procedure: CYSTOSCOPY WITH  FULGURATION [id number];  Surgeon: Franchot Gallo, MD;  Location: [facility];  Service: Urology;  Laterality: N/A;  [place] N/A 01/23/2022   Procedure: CYSTOSCOPY WITH FULGERATION OF PROSTATIC URETHRA;  Surgeon: Franchot Gallo, MD;  Location: [facility];  Service: Urology;  Laterality: N/A;  [place] N/A 08/29/2015   Procedure: Atrial Fibrillation Ablation;  Surgeon: [name], MD;  Location: [place] CV LAB;  Service: Cardiovascular;  Laterality: N/A;   KNEE SURGERY Bilateral x3   1960s & 1970s   open   LEFT ATRIAL APPENDAGE OCCLUSION N/A 05/30/2022   Procedure: LEFT ATRIAL APPENDAGE OCCLUSION;  Surgeon: Vickie Epley, MD;  Location: [place] CV LAB;  Service: Cardiovascular;  Laterality: N/A;    LEFT HEART CATHETERIZATION WITH CORONARY ANGIOGRAM N/A 06/07/2011   Procedure: LEFT HEART CATHETERIZATION WITH CORONARY ANGIOGRAM;  Surgeon: Peter M Martinique, MD;  Location: [facility] CATH LAB;  Service: Cardiovascular;  Laterality: N/A;    Normal coronary arteries,  low normal LVF   TEE WITHOUT CARDIOVERSION N/A 05/30/2022   Procedure: TRANSESOPHAGEAL ECHOCARDIOGRAM (TEE);  Surgeon: Vickie Epley, MD;  Location: [place] CV LAB;  Service: Cardiovascular;  Laterality: N/A;   TONSILLECTOMY AND ADENOIDECTOMY  age 61   TOTAL HIP ARTHROPLASTY Right 06/04/10    dr Noemi Chapel  '@[facility]'$    TOTAL KNEE ARTHROPLASTY Bilateral right 1995;  left [place]  06-29-2002   dr humphries  '@[facility]'$    TRANSURETHRAL RESECTION OF PROSTATE N/A 07/06/2018   Procedure: TRANSURETHRAL RESECTION OF THE PROSTATE (TURP);  Surgeon: Franchot Gallo, MD;  Location: [facility];  Service: Urology;  Laterality: N/A;  34 MINS   Patient Active Problem List   Diagnosis Date Noted   Atrial fibrillation ([place]) 05/30/2022   Presence of Watchman left atrial appendage closure device 05/30/2022   Hematuria 01/04/2022   Gross hematuria 01/04/2022   Secondary hypercoagulable state ([place]) 03/28/2020   Malignant neoplasm of prostate ([place]) 09/11/2018   Enlarged prostate with urinary obstruction 07/06/2018   Gastrocnemius strain, right, initial encounter 12/10/2017   Baker's cyst of knee, right 12/10/2017   Obesity 03/24/2017   Paroxysmal atrial fibrillation ([place]) 12/05/2016   Near syncope 04/20/2016   Leg hematoma, left, initial encounter 04/20/2016   Bleeding    Ecchymosis    Left hamstring muscle strain    Muscle tear    RBBB 0000000   Chronic systolic dysfunction of left ventricle 07/11/2014   OSA (obstructive sleep apnea) 04/07/2013   Multinodular goiter 01/20/2013   Cervical spondylosis without myelopathy 01/18/2013   Left ventricular dysfunction 12/17/2011   Low back pain 09/11/2011    Fatigue 05/22/2011   PVC (premature ventricular contraction) 01/18/2011   Persistent atrial fibrillation ([place]) 01/12/2011   Hyperlipidemia 08/02/2010   Hypertension 08/02/2010   Osteoarthritis 08/02/2010   BPH (benign prostatic hyperplasia) 08/02/2010   GERD (gastroesophageal reflux disease) 08/02/2010   h/o Atrial flutter ([place]) s/p ablation 2012     PCP: Copland, MD  REFERRING PROVIDER: Copland, MD  REFERRING DIAG: Debility  Rationale for Evaluation and Treatment: Rehabilitation  THERAPY DIAG:  Muscle weakness (generalized)  Difficulty in walking, not elsewhere classified  Unsteadiness on feet  ONSET DATE: August 2023  SUBJECTIVE:  SUBJECTIVE STATEMENT: Patient reports that he had to cancel one appointment and then has been at the beach, he reports that overall he thought he did well, no falls , he does report fatigue PAIN: has knee and hip issues with DJD  PRECAUTIONS: None  WEIGHT BEARING RESTRICTIONS: No  FALLS:  Has patient fallen in last 6 months? No  LIVING ENVIRONMENT: Lives with: lives with their family Lives in: House/apartment Stairs: Yes: Internal: 12 steps; can reach both Has following equipment at home: None  OCCUPATION: retired  PLOF: Independent  was working out at [employer] 2x/week  PATIENT GOALS: be stronger, move better, return to the gym  NEXT MD VISIT:   OBJECTIVE:   DIAGNOSTIC FINDINGS:  none  COGNITION: Overall cognitive status: Within functional limits for tasks assessed     SENSATION: WFL  MUSCLE LENGTH: Hamstrings: Right 45 deg; Left 45 deg POSTURE: rounded shoulders and forward head  PALPATION: Non tender  LUMBAR ROM: decreased 50% due to stiffness  LOWER EXTREMITY ROM:     Active  Right eval Left eval  Hip flexion    Hip  extension    Hip abduction    Hip adduction    Hip internal rotation    Hip external rotation    Knee flexion    Knee extension 10 15  Ankle dorsiflexion    Ankle plantarflexion    Ankle inversion    Ankle eversion     (Blank rows = not tested)  LOWER EXTREMITY MMT:    MMT Right eval Left eval  Hip flexion 3+ 3+  Hip extension 4- 4-  Hip abduction 3+ 3+  Hip adduction    Hip internal rotation    Hip external rotation    Knee flexion 4 4  Knee extension 3+ 3+  Ankle dorsiflexion 4 4  Ankle plantarflexion 3+ 3+  Ankle inversion    Ankle eversion     (Blank rows = not tested)   FUNCTIONAL TESTS:  5 times sit to stand: 25 seconds Timed up and go (TUG): 22  seconds 6 minute walk test:  had to stop at 3 minutes at 540', got very SOB and was unsteady   TUG 06/10/22 16 seconds   6 minute walk test 06/10/22 = 800 feet with SOB and one standing rest break  5XSTS 06/10/22 20 seconds BERG 43/56 = 06/19/22 GAIT: Distance walked: 100' Assistive device utilized: None Level of assistance: Complete Independence Comments: slow, WBOS, stairs one at a time  TODAY'S TREATMENT:                                                                                                                              DATE:  07/09/22 Walk around the parking island in the back, had to take 3 rest breaks due to fatigue in the legs and with his breathing Bike level 4 x 6 minutes Nustep level 5 x 6 minutes On airex ball toss Lat pulls  25# 2x10 Seated rows 25# 2x10 Leg curls 25# 3x10 Leg extension 5# 3x10 Passive HS and piriformis stretch  06/24/22 Nustep level 6 x 6 mintues Bike level 5 x 6 minutes Side step on and off airex,  Volley ball and then volleyball on the airex Walking figure 8's on mat Walking trying to not change speeds across the mat On airex cone toe and hand touches 2.5# hip abduction Leg curls 25# 3x10 Feet on ball K2C, trunk rotation, small bridge and isometric abs  06/19/22 Nustep  level 5 x 6 minutes UBE level 4 x 4 minutes Leg curls 35# 3x10 Leg extension 5# 3x10 Seated row25# 3x10 Lats 25# 3x10 Gait around the parking island in the back 3 instances of instability, he was able to catch himself but reported legs very tired and weak Berg 43/56  06/17/22 Nustep level 5 x 6 minutes High level balance activties, on airex 6" and 8" toe touches, side step on and off airex, side stepping ball toss, fwd walking ball toss, on large mat figure 8's, stepping over objects, obstacle course and walking across foam mat without changing gait speed UBE level 4 x 5 minutes Passive stretch to the HS and piriformis  06/13/22 Nustep level 5 x 6 minutes Cone toe touches solid surface and on airex 2.5# marches, hip abduction and extension 2x10 On airex balance beam side stepping Bike level 4 x 6 minutes Leg curls 35# 3x10 Leg extension 5# 3x10 UBE level 3 x 6 minutes  PATIENT EDUCATION:  Education details: POC Person educated: Patient Education method: Explanation Education comprehension: verbalized understanding  HOME EXERCISE PROGRAM: TBD  ASSESSMENT:  CLINICAL IMPRESSION:  Patient had a lot of difficulty with walking today, he required 3 rest breaks to walk around the back parking island, c/o leg and overall fatigue.  I tested his O2 sats at 98% and his HR was 77bpm, he reports that his BP has been good, he has started on a new blood thinner.  The last time we did this he needed one short break  OBJECTIVE IMPAIRMENTS: Abnormal gait, cardiopulmonary status limiting activity, decreased activity tolerance, decreased balance, decreased endurance, decreased mobility, difficulty walking, decreased ROM, decreased strength, and impaired flexibility.   REHAB POTENTIAL: Good  CLINICAL DECISION MAKING: Stable/uncomplicated  EVALUATION COMPLEXITY: Low   GOALS: Goals reviewed with patient? Yes  SHORT TERM GOALS: Target date: 04/22/22  Independent with initial HEP Goal  status: met  LONG TERM GOALS: Target date: 07/03/22  Understand posture and body mechanics Goal status: progressing  2.  Decrease TUG time to 13 seconds Goal status: progressing  3.  Decrease 5x STS to 15 seconds Goal status: progressing  4.  Increase 6 minute walk test to 1200 feet Goal status:progressing  5.  Return safely to the gym Goal status: progressing  PLAN:  PT FREQUENCY: 1-2x/week  PT DURATION: 12 weeks  PLANNED INTERVENTIONS: Therapeutic exercises, Therapeutic activity, Neuromuscular re-education, Balance training, Gait training, Patient/Family education, Self Care, Joint mobilization, Stair training, and Manual therapy.  PLAN FOR NEXT SESSION:  will add some higher level balance activities to his HEP   Sumner Boast, PT 07/09/2022, 2:00 PM

## 2022-07-10 ENCOUNTER — Encounter (INDEPENDENT_AMBULATORY_CARE_PROVIDER_SITE_OTHER): Payer: Medicare PPO | Admitting: Ophthalmology

## 2022-07-10 DIAGNOSIS — H43813 Vitreous degeneration, bilateral: Secondary | ICD-10-CM

## 2022-07-10 DIAGNOSIS — H35033 Hypertensive retinopathy, bilateral: Secondary | ICD-10-CM

## 2022-07-10 DIAGNOSIS — H353132 Nonexudative age-related macular degeneration, bilateral, intermediate dry stage: Secondary | ICD-10-CM | POA: Diagnosis not present

## 2022-07-10 DIAGNOSIS — I10 Essential (primary) hypertension: Secondary | ICD-10-CM

## 2022-07-16 ENCOUNTER — Encounter: Payer: Self-pay | Admitting: Physical Therapy

## 2022-07-16 ENCOUNTER — Ambulatory Visit: Payer: Medicare PPO | Admitting: Physical Therapy

## 2022-07-16 DIAGNOSIS — M6281 Muscle weakness (generalized): Secondary | ICD-10-CM

## 2022-07-16 DIAGNOSIS — R2681 Unsteadiness on feet: Secondary | ICD-10-CM

## 2022-07-16 DIAGNOSIS — R262 Difficulty in walking, not elsewhere classified: Secondary | ICD-10-CM | POA: Diagnosis not present

## 2022-07-16 DIAGNOSIS — R252 Cramp and spasm: Secondary | ICD-10-CM

## 2022-07-16 NOTE — Therapy (Signed)
OUTPATIENT PHYSICAL THERAPY THORACOLUMBAR TREATMENT    Patient Name: Connor [name] MRN: [id number] DOB:[birth year], 89 y.o., male Today's Date: 07/16/2022  END OF SESSION:  PT End of Session - 07/16/22 1310     Visit Number 17    Number of Visits 22    Date for PT Re-Evaluation 08/09/22    Authorization Type [employer]    Authorization Time Period 7/10    PT Start Time 1310    PT Stop Time 1415    PT Time Calculation (min) 65 min    Activity Tolerance Patient tolerated treatment well    Behavior During Therapy WFL for tasks assessed/performed             Past Medical History:  Diagnosis Date   Benign localized prostatic hyperplasia with lower urinary tract symptoms (LUTS)    urologist-- dr Diona Fanti   Cancer [facility])    prostate   Chronic anticoagulation    Xarelto for afib   Chronic constipation    Chronic cystitis    Gross hematuria    History of DVT of lower extremity 06/08/2010   right femoral dvt dx post op total hip 05-08-2010   History of gastric ulcer    remote   History of kidney stones    one stone. remote hx   Hypercholesteremia    Hyperlipidemia    Hypertension    Dx by Dr. Lance Muss around age  12    Macular degeneration    dry   NICM (nonischemic cardiomyopathy) ([place])    per echo 06-11-2017,  ef 45-50% with diffuse hypokinesis,  G1DD   OSA on CPAP 2017   compliant with CPAP.  managed by Dr Elsworth Soho.   Osteoarthritis    Other urethral stricture, male, meatal    chronic;  pt self dilates   Paroxysmal atrial fibrillation [facility]) cardiologist-- dr Rayann Heman   first dx 2009/   a. documented by Dr Barrett Shell on office EKG 9/12. b. maintained on Tikosyn and Xarelto. c. s/p DCCV 's.  d.  x3  EP w/ ablation atrial fib last one 12-05-2016   Prediabetes    Premature ventricular contraction    Presence of Watchman left atrial appendage closure device 05/30/2022   Watchman FLX 49m by Dr. LQuentin Ore  PVC's (premature ventricular contractions)    RA (rheumatoid  arthritis) ([facility]    rheumatologist--- Dr APage Spiro  treated with Humira   RBBB (right bundle branch block)    Hx of a fib, ablation Aug 2018   Rheumatoid arthritis ([place]    Vitamin D deficiency    Wears hearing aid in both ears    Past Surgical History:  Procedure Laterality Date   ABLATION OF DYSRHYTHMIC FOCUS  08/29/2015   ATRIAL FIBRILLATION ABLATION  12/05/2016   ATRIAL FIBRILLATION ABLATION N/A 12/05/2016   Procedure: Atrial Fibrillation Ablation;  Surgeon: AThompson Grayer MD;  [place] [facility];  Service: Cardiovascular;  Laterality: N/A;   ATRIAL FIBRILLATION ABLATION N/A 02/29/2020   Procedure: ATRIAL FIBRILLATION ABLATION;  Surgeon: AThompson Grayer MD;  [place] [facility];  Service: Cardiovascular;  Laterality: N/A;   ATRIAL FLUTTER ABLATION  09/2010   by JGlenford BayleyRight 1990s   CARDIOVERSION N/A 10/28/2012   Procedure: CARDIOVERSION;  Surgeon: CBurnell Blanks MD;  [place]: [place]  Service: Cardiovascular;  Laterality: N/A;   CATARACT EXTRACTION W/ INTRAOCULAR LENS  IMPLANT, BILATERAL  2019   CYSTOSCOPY WITH BIOPSY N/A 09/03/2018   Procedure: CYSTOSCOPY WITH  FULGURATION [id number];  Surgeon: Franchot Gallo, MD;  [facility];  Service: Urology;  Laterality: N/A;  [place] N/A 01/23/2022   Procedure: CYSTOSCOPY WITH FULGERATION OF PROSTATIC URETHRA;  Surgeon: Franchot Gallo, MD;  [facility];  Service: Urology;  Laterality: N/A;  [place] N/A 08/29/2015   Procedure: Atrial Fibrillation Ablation;  Surgeon: [name], MD;  Location: [place] CV LAB;  Service: Cardiovascular;  Laterality: N/A;   KNEE SURGERY Bilateral x3   1960s & 1970s   open   LEFT ATRIAL APPENDAGE OCCLUSION N/A 05/30/2022   Procedure: LEFT ATRIAL APPENDAGE OCCLUSION;  Surgeon: Vickie Epley, MD;  Location: [place] CV LAB;  Service: Cardiovascular;  Laterality: N/A;    LEFT HEART CATHETERIZATION WITH CORONARY ANGIOGRAM N/A 06/07/2011   Procedure: LEFT HEART CATHETERIZATION WITH CORONARY ANGIOGRAM;  Surgeon: Peter M Martinique, MD;  Location: [facility] C[facility] LAB;  Service: Cardiovascular;  Laterality: N/A;    Normal coronary arteries,  low normal LVF   TEE WITHOUT CARDIOVERSION N/A 05/30/2022   Procedure: TRANSESOPHAGEAL ECHOCARDIOGRAM (TEE);  Surgeon: Vickie Epley, MD;  Location: [place] CV LAB;  Service: Cardiovascular;  Laterality: N/A;   TONSILLECTOMY AND ADENOIDECTOMY  age 32   TOTAL HIP ARTHROPLASTY Right 06/04/10    dr Noemi Chapel  '@[facility]'$    TOTAL KNEE ARTHROPLASTY Bilateral right 1995;  left [place]  06-29-2002   dr humphries  '@[facility]'$    TRANSURETHRAL RESECTION OF PROSTATE N/A 07/06/2018   Procedure: TRANSURETHRAL RESECTION OF THE PROSTATE (TURP);  Surgeon: Franchot Gallo, MD;  Location: [facility];  Service: Urology;  Laterality: N/A;  58 MINS   Patient Active Problem List   Diagnosis Date Noted   Atrial fibrillation ([place]) 05/30/2022   Presence of Watchman left atrial appendage closure device 05/30/2022   Hematuria 01/04/2022   Gross hematuria 01/04/2022   Secondary hypercoagulable state ([place]) 03/28/2020   Malignant neoplasm of prostate ([place]) 09/11/2018   Enlarged prostate with urinary obstruction 07/06/2018   Gastrocnemius strain, right, initial encounter 12/10/2017   Baker's cyst of knee, right 12/10/2017   Obesity 03/24/2017   Paroxysmal atrial fibrillation ([place]) 12/05/2016   Near syncope 04/20/2016   Leg hematoma, left, initial encounter 04/20/2016   Bleeding    Ecchymosis    Left hamstring muscle strain    Muscle tear    RBBB 0000000   Chronic systolic dysfunction of left ventricle 07/11/2014   OSA (obstructive sleep apnea) 04/07/2013   Multinodular goiter 01/20/2013   Cervical spondylosis without myelopathy 01/18/2013   Left ventricular dysfunction 12/17/2011   Low back pain 09/11/2011    Fatigue 05/22/2011   PVC (premature ventricular contraction) 01/18/2011   Persistent atrial fibrillation ([place]) 01/12/2011   Hyperlipidemia 08/02/2010   Hypertension 08/02/2010   Osteoarthritis 08/02/2010   BPH (benign prostatic hyperplasia) 08/02/2010   GERD (gastroesophageal reflux disease) 08/02/2010   h/o Atrial flutter ([place]) s/p ablation 2012     PCP: Copland, MD  REFERRING PROVIDER: Copland, MD  REFERRING DIAG: Debility  Rationale for Evaluation and Treatment: Rehabilitation  THERAPY DIAG:  Muscle weakness (generalized)  Difficulty in walking, not elsewhere classified  Unsteadiness on feet  Cramp and spasm  ONSET DATE: August 2023  SUBJECTIVE:  SUBJECTIVE STATEMENT: Patient continues to report fatigue and some hip pain PAIN: has knee and hip issues with DJD  PRECAUTIONS: None  WEIGHT BEARING RESTRICTIONS: No  FALLS:  Has patient fallen in last 6 months? No  LIVING ENVIRONMENT: Lives with: lives with their family Lives in: House/apartment Stairs: Yes: Internal: 12 steps; can reach both Has following equipment at home: None  OCCUPATION: retired  PLOF: Independent  was working out at [employer] 2x/week  PATIENT GOALS: be stronger, move better, return to the gym  NEXT MD VISIT:   OBJECTIVE:   DIAGNOSTIC FINDINGS:  none  COGNITION: Overall cognitive status: Within functional limits for tasks assessed     SENSATION: WFL  MUSCLE LENGTH: Hamstrings: Right 45 deg; Left 45 deg POSTURE: rounded shoulders and forward head  PALPATION: Non tender  LUMBAR ROM: decreased 50% due to stiffness  LOWER EXTREMITY ROM:     Active  Right eval Left eval  Hip flexion    Hip extension    Hip abduction    Hip adduction    Hip internal rotation    Hip external rotation     Knee flexion    Knee extension 10 15  Ankle dorsiflexion    Ankle plantarflexion    Ankle inversion    Ankle eversion     (Blank rows = not tested)  LOWER EXTREMITY MMT:    MMT Right eval Left eval  Hip flexion 3+ 3+  Hip extension 4- 4-  Hip abduction 3+ 3+  Hip adduction    Hip internal rotation    Hip external rotation    Knee flexion 4 4  Knee extension 3+ 3+  Ankle dorsiflexion 4 4  Ankle plantarflexion 3+ 3+  Ankle inversion    Ankle eversion     (Blank rows = not tested)   FUNCTIONAL TESTS:  5 times sit to stand: 25 seconds Timed up and go (TUG): 22  seconds 6 minute walk test:  had to stop at 3 minutes at 540', got very SOB and was unsteady   TUG 06/10/22 16 seconds   6 minute walk test 06/10/22 = 800 feet with SOB and one standing rest break  5XSTS 06/10/22 20 seconds BERG 43/56 = 06/19/22 GAIT: Distance walked: 100' Assistive device utilized: None Level of assistance: Complete Independence Comments: slow, WBOS, stairs one at a time  TODAY'S TREATMENT:                                                                                                                              DATE:  07/16/22 Walk around the parking island in the back, went down hill first and he only required one rest break, still c/o significant fatigue in the legs and he was breathing heavily Nustep level 5 x 6 minutes Leg curls 35# 2x15 10# straight arm pulls 15# AR press On airex balance beam side step and tandem walk On airex cone toe touches On airex ball  toss On airex eyes closed Side step on and off airex Passive stretch HS and piriformis Isometric abs with ball in lap  07/09/22 Walk around the parking island in the back, had to take 3 rest breaks due to fatigue in the legs and with his breathing Bike level 4 x 6 minutes Nustep level 5 x 6 minutes On airex ball toss Lat pulls 25# 2x10 Seated rows 25# 2x10 Leg curls 25# 3x10 Leg extension 5# 3x10 Passive HS and piriformis  stretch  06/24/22 Nustep level 6 x 6 mintues Bike level 5 x 6 minutes Side step on and off airex,  Volley ball and then volleyball on the airex Walking figure 8's on mat Walking trying to not change speeds across the mat On airex cone toe and hand touches 2.5# hip abduction Leg curls 25# 3x10 Feet on ball K2C, trunk rotation, small bridge and isometric abs  06/19/22 Nustep level 5 x 6 minutes UBE level 4 x 4 minutes Leg curls 35# 3x10 Leg extension 5# 3x10 Seated row25# 3x10 Lats 25# 3x10 Gait around the parking island in the back 3 instances of instability, he was able to catch himself but reported legs very tired and weak Berg 43/56  06/17/22 Nustep level 5 x 6 minutes High level balance activties, on airex 6" and 8" toe touches, side step on and off airex, side stepping ball toss, fwd walking ball toss, on large mat figure 8's, stepping over objects, obstacle course and walking across foam mat without changing gait speed UBE level 4 x 5 minutes Passive stretch to the HS and piriformis  PATIENT EDUCATION:  Education details: POC Person educated: Patient Education method: Explanation Education comprehension: verbalized understanding  HOME EXERCISE PROGRAM: TBD  ASSESSMENT:  CLINICAL IMPRESSION:  Patient continues to have some fatigue with a light walk, he did have one rest break today compared to 3 last week.  HE did have some c/o pain in the left hip and feels like it was resolved with the piriformis stretch that we did  OBJECTIVE IMPAIRMENTS: Abnormal gait, cardiopulmonary status limiting activity, decreased activity tolerance, decreased balance, decreased endurance, decreased mobility, difficulty walking, decreased ROM, decreased strength, and impaired flexibility.   REHAB POTENTIAL: Good  CLINICAL DECISION MAKING: Stable/uncomplicated  EVALUATION COMPLEXITY: Low   GOALS: Goals reviewed with patient? Yes  SHORT TERM GOALS: Target date: 04/22/22  Independent  with initial HEP Goal status: met  LONG TERM GOALS: Target date: 07/03/22  Understand posture and body mechanics Goal status: met 07/16/22  2.  Decrease TUG time to 13 seconds Goal status: progressing  3.  Decrease 5x STS to 15 seconds Goal status: progressing  4.  Increase 6 minute walk test to 1200 feet Goal status:progressing  5.  Return safely to the gym Goal status: progressing  PLAN:  PT FREQUENCY: 1-2x/week  PT DURATION: 12 weeks  PLANNED INTERVENTIONS: Therapeutic exercises, Therapeutic activity, Neuromuscular re-education, Balance training, Gait training, Patient/Family education, Self Care, Joint mobilization, Stair training, and Manual therapy.  PLAN FOR NEXT SESSION:  will add some higher level balance activities to his HEP   Sumner Boast, PT 07/16/2022, 1:10 PM

## 2022-07-21 DIAGNOSIS — H353132 Nonexudative age-related macular degeneration, bilateral, intermediate dry stage: Secondary | ICD-10-CM | POA: Diagnosis not present

## 2022-07-22 DIAGNOSIS — R195 Other fecal abnormalities: Secondary | ICD-10-CM | POA: Diagnosis not present

## 2022-07-22 DIAGNOSIS — D649 Anemia, unspecified: Secondary | ICD-10-CM | POA: Diagnosis not present

## 2022-07-22 NOTE — Progress Notes (Unsigned)
HPI: Follow-up atrial fibrillation, hypertension, hyperlipidemia. Patient had a normal cardiac catheterization in 2013.  Patient had atrial fibrillation ablation in 2017 and 18.  He again had atrial fibrillation ablation in October 2021 with Dr. Rayann Heman. Echocardiogram December 2023 showed normal LV function, grade 2 diastolic dysfunction, moderate right ventricular enlargement, moderate left atrial enlargement, mild to moderate right atrial enlargement, mild mitral regurgitation, mild aortic insufficiency, mildly dilated aortic root.  Had Watchman placed January 2024 (history of hematuria).  Follow-up TEE January 2024 showed normal LV function, moderate right ventricular enlargement, biatrial enlargement, small pericardial effusion, mild mitral regurgitation, trace aortic insufficiency, PFO.  Since last seen, there is no dyspnea, chest pain, palpitations or syncope.  His hematuria has improved now that he is off of Xarelto.  Current Outpatient Medications  Medication Sig Dispense Refill   acetaminophen (TYLENOL) 500 MG tablet Take 1,000 mg by mouth 2 (two) times daily as needed for moderate pain.     atorvastatin (LIPITOR) 10 MG tablet TAKE 1 TABLET BY MOUTH EVERY DAY 90 tablet 3   Carboxymethylcellulose Sodium (THERATEARS OP) Apply 1 drop to eye daily as needed (dry eyes).     cephALEXin (KEFLEX) 500 MG capsule Take 2,000 mg by mouth as directed. Dental appointments     Cholecalciferol (VITAMIN D3) 5000 UNITS TABS Take 5,000 Units by mouth every morning.     clopidogrel (PLAVIX) 75 MG tablet Take 1 tablet (75 mg total) by mouth daily. Start on 3/11 and stop Xarelto on 3/10. 90 tablet 3   Cranberry 1000 MG CAPS Take 4,000 mg by mouth 2 (two) times daily.     finasteride (PROSCAR) 5 MG tablet Take 5 mg by mouth every morning.   3   HUMIRA PEN 40 MG/0.4ML PNKT Inject 40 mg as directed every 7 (seven) days. Every Tuesday  6   Iron Combinations (CHROMAGEN) capsule Take 1 capsule by mouth daily.      lisinopril (ZESTRIL) 40 MG tablet TAKE 1 TABLET BY MOUTH EVERY DAY 90 tablet 3   Melatonin 10 MG CHEW Chew 3 Pieces of gum by mouth at bedtime. gummy     metoprolol succinate (TOPROL-XL) 50 MG 24 hr tablet Take 1 tablet (50 mg total) by mouth in the morning and at bedtime. Take with or immediately following a meal. 135 tablet 3   Multiple Vitamins-Minerals (PRESERVISION AREDS 2) CAPS Take 1 capsule by mouth 2 (two) times a day.     pantoprazole (PROTONIX) 40 MG tablet Take 1 tablet (40 mg total) by mouth daily. 60 tablet 2   senna (SENOKOT) 8.6 MG TABS tablet Take 2 tablets by mouth at bedtime. Per patient taking 2 tablets daily     solifenacin (VESICARE) 10 MG tablet Take 10 mg by mouth at bedtime.      tamsulosin (FLOMAX) 0.4 MG CAPS capsule Take 1 capsule (0.4 mg total) by mouth daily after supper. 30 capsule 5   Testosterone 20.25 MG/ACT (1.62%) GEL Apply 1 Pump topically at bedtime. Apply to shoulders     vitamin B-12 (CYANOCOBALAMIN) 500 MCG tablet Take 500 mcg by mouth every evening.      No current facility-administered medications for this visit.     Past Medical History:  Diagnosis Date   Benign localized prostatic hyperplasia with lower urinary tract symptoms (LUTS)    urologist-- dr Diona Fanti   Cancer [facility])    prostate   Chronic anticoagulation    Xarelto for afib   Chronic constipation  Chronic cystitis    Gross hematuria    History of DVT of lower extremity 06/08/2010   right femoral dvt dx post op total hip 05-08-2010   History of gastric ulcer    remote   History of kidney stones    one stone. remote hx   Hypercholesteremia    Hyperlipidemia    Hypertension    Dx by Dr. Lance Muss around age  54    Macular degeneration    dry   NICM (nonischemic cardiomyopathy) ([place])    per echo 06-11-2017,  ef 45-50% with diffuse hypokinesis,  G1DD   OSA on CPAP 2017   compliant with CPAP.  managed by Dr Elsworth Soho.   Osteoarthritis    Other urethral stricture, male, meatal     chronic;  pt self dilates   Paroxysmal atrial fibrillation [facility]) cardiologist-- dr Rayann Heman   first dx 2009/   a. documented by Dr Barrett Shell on office EKG 9/12. b. maintained on Tikosyn and Xarelto. c. s/p DCCV 's.  d.  x3  EP w/ ablation atrial fib last one 12-05-2016   Prediabetes    Premature ventricular contraction    Presence of Watchman left atrial appendage closure device 05/30/2022   Watchman FLX 17mm by Dr. Quentin Ore   PVC's (premature ventricular contractions)    RA (rheumatoid arthritis) [facility])    rheumatologist--- Dr Page Spiro,  treated with Humira   RBBB (right bundle branch block)    Hx of a fib, ablation Aug 2018   Rheumatoid arthritis ([place])    Vitamin D deficiency    Wears hearing aid in both ears     Past Surgical History:  Procedure Laterality Date   ABLATION OF DYSRHYTHMIC FOCUS  08/29/2015   ATRIAL FIBRILLATION ABLATION  12/05/2016   ATRIAL FIBRILLATION ABLATION N/A 12/05/2016   Procedure: Atrial Fibrillation Ablation;  Surgeon: [name], MD;  Location: [place] CV LAB;  Service: Cardiovascular;  Laterality: N/A;   ATRIAL FIBRILLATION ABLATION N/A 02/29/2020   Procedure: ATRIAL FIBRILLATION ABLATION;  Surgeon: [name], MD;  Location: [place] CV LAB;  Service: Cardiovascular;  Laterality: N/A;   ATRIAL FLUTTER ABLATION  09/2010   by Glenford Bayley Right 1990s   CARDIOVERSION N/A 10/28/2012   Procedure: CARDIOVERSION;  Surgeon: Burnell Blanks, MD;  Location: [place];  Service: Cardiovascular;  Laterality: N/A;   CATARACT EXTRACTION W/ INTRAOCULAR LENS  IMPLANT, BILATERAL  2019   CYSTOSCOPY WITH BIOPSY N/A 09/03/2018   Procedure: CYSTOSCOPY WITH FULGURATION [place];  Surgeon: Franchot Gallo, MD;  [facility];  Service: Urology;  Laterality: N/A;  [place] N/A 01/23/2022   Procedure: CYSTOSCOPY WITH FULGERATION OF PROSTATIC URETHRA;  Surgeon: Franchot Gallo, MD;  [facility];  Service: Urology;   Laterality: N/A;  [place] N/A 08/29/2015   Procedure: Atrial Fibrillation Ablation;  Surgeon: [name], MD;  Location: [place] CV LAB;  Service: Cardiovascular;  Laterality: N/A;   KNEE SURGERY Bilateral x3   1960s & 1970s   open   LEFT ATRIAL APPENDAGE OCCLUSION N/A 05/30/2022   Procedure: LEFT ATRIAL APPENDAGE OCCLUSION;  Surgeon: Vickie Epley, MD;  Location: [place] CV LAB;  Service: Cardiovascular;  Laterality: N/A;   LEFT HEART CATHETERIZATION WITH CORONARY ANGIOGRAM N/A 06/07/2011   Procedure: LEFT HEART CATHETERIZATION WITH CORONARY ANGIOGRAM;  Surgeon: Peter M Martinique, MD;  Location: [facility] CATH LAB;  Service: Cardiovascular;  Laterality:  N/A;    Normal coronary arteries,  low normal LVF   TEE WITHOUT CARDIOVERSION N/A 05/30/2022   Procedure: TRANSESOPHAGEAL ECHOCARDIOGRAM (TEE);  Surgeon: Vickie Epley, MD;  Location: [place] CV LAB;  Service: Cardiovascular;  Laterality: N/A;   TONSILLECTOMY AND ADENOIDECTOMY  age 89   TOTAL HIP ARTHROPLASTY Right 06/04/10    dr Noemi Chapel  @[facility]    TOTAL KNEE ARTHROPLASTY Bilateral right 1995;  left [place]  06-29-2002   dr humphries  @[facility]    TRANSURETHRAL RESECTION OF PROSTATE N/A 07/06/2018   Procedure: TRANSURETHRAL RESECTION OF THE PROSTATE (TURP);  Surgeon: Franchot Gallo, MD;  Location[facility];  Service: Urology;  Laterality: N/A;  15 MINS    Social History   Socioeconomic History   Marital status: Married    Spouse name: Joycelyn Schmid   Number of children: Not on file   Years of education: Not on file   Highest education level: Not on file  Occupational History   Occupation: Retired  Tobacco Use   Smoking status: Former    Packs/day: 1.00    Years: 30.00    Additional pack years: 0.00    Total pack years: 30.00    Types: Cigarettes    Quit date: 05/06/1978    Years since quitting: 44.2   Smokeless tobacco: Never    Tobacco comments:    Former smoker 03/14/2021  Vaping Use   Vaping Use: Never used  Substance and Sexual Activity   Alcohol use: Yes    Alcohol/week: 1.0 standard drink of alcohol    Types: 1 Cans of beer per week    Comment: occasionally wine and beer   Drug use: Never   Sexual activity: Not on file  Other Topics Concern   Not on file  Social History Narrative   he patient lives in [place] with his spouse.  Retired.   Social Determinants of Health   Financial Resource Strain: Low Risk  (06/18/2022)   Overall Financial Resource Strain (CARDIA)    Difficulty of Paying Living Expenses: Not hard at all  Food Insecurity: No Food Insecurity (06/18/2022)   Hunger Vital Sign    Worried About Running Out of Food in the Last Year: Never true    Ran Out of Food in the Last Year: Never true  Transportation Needs: No Transportation Needs (06/18/2022)   PRAPARE - Hydrologist (Medical): No    Lack of Transportation (Non-Medical): No  Physical Activity: Insufficiently Active (06/18/2022)   Exercise Vital Sign    Days of Exercise per Week: 1 day    Minutes of Exercise per Session: 20 min  Stress: No Stress Concern Present (06/18/2022)   [place]    Feeling of Stress : Not at all  Social Connections: Moderately Integrated (06/18/2022)   Social Connection and Isolation Panel [NHANES]    Frequency of Communication with Friends and Family: More than three times a week    Frequency of Social Gatherings with Friends and Family: Three times a week    Attends Religious Services: Never    Active Member of Clubs or Organizations: Yes    Attends Archivist Meetings: 1 to 4 times per year    Marital Status: Married  Human resources officer Violence: Not At Risk (06/25/2022)   Humiliation, Afraid, Rape, and Kick questionnaire    Fear of Current or Ex-Partner: No    Emotionally Abused: No  Physically  Abused: No    Sexually Abused: No    Family History  Problem Relation Age of Onset   Heart attack Father    Lung disease Mother    Arrhythmia Sister    Atrial fibrillation Brother    Diabetes Neg Hx     ROS: no fevers or chills, productive cough, hemoptysis, dysphasia, odynophagia, melena, hematochezia, dysuria, hematuria, rash, seizure activity, orthopnea, PND, pedal edema, claudication. Remaining systems are negative.  Physical Exam: Well-developed well-nourished in no acute distress.  Skin is warm and dry.  HEENT is normal.  Neck is supple.  Chest is clear to auscultation with normal expansion.  Cardiovascular exam is regular rate and rhythm.  Abdominal exam nontender or distended. No masses palpated. Extremities show no edema. neuro grossly intact   A/P  1 paroxysmal atrial fibrillation-patient has had 3 prior ablations and remains in sinus rhythm on exam.  Continue Toprol.  He is now status post Watchman device given history of recurrent hematuria.  He is on Plavix to complete full 6 months of anticoagulation following recent Watchman device.  2 hypertension-patient's blood pressure is controlled.  Continue present medical regimen.  3 hyperlipidemia-continue statin.  4 nonischemic cardiomyopathy-LV function has normalized on most recent echocardiogram.  5 obstructive sleep apnea-continue CPAP.  6 obesity-we previously discussed the importance of weight loss.  Kirk Ruths, MD

## 2022-07-23 ENCOUNTER — Ambulatory Visit: Payer: Medicare PPO | Admitting: Physical Therapy

## 2022-07-23 ENCOUNTER — Encounter: Payer: Self-pay | Admitting: Physical Therapy

## 2022-07-23 DIAGNOSIS — M6281 Muscle weakness (generalized): Secondary | ICD-10-CM | POA: Diagnosis not present

## 2022-07-23 DIAGNOSIS — R2681 Unsteadiness on feet: Secondary | ICD-10-CM | POA: Diagnosis not present

## 2022-07-23 DIAGNOSIS — R262 Difficulty in walking, not elsewhere classified: Secondary | ICD-10-CM

## 2022-07-23 DIAGNOSIS — R252 Cramp and spasm: Secondary | ICD-10-CM | POA: Diagnosis not present

## 2022-07-23 NOTE — Therapy (Signed)
OUTPATIENT PHYSICAL THERAPY THORACOLUMBAR TREATMENT    Patient Name: Connor [name] MRN: [id number] DOB:07-23-1933, 89 y.o., male Today's Date: 07/23/2022  END OF SESSION:  PT End of Session - 07/23/22 1348     Visit Number 18    Number of Visits 22    Date for PT Re-Evaluation 08/09/22    Authorization Type [employer]    Authorization Time Period 8/10    PT Start Time 1305    PT Stop Time 1450    PT Time Calculation (min) 105 min    Activity Tolerance Patient tolerated treatment well    Behavior During Therapy WFL for tasks assessed/performed             Past Medical History:  Diagnosis Date   Benign localized prostatic hyperplasia with lower urinary tract symptoms (LUTS)    urologist-- dr Diona Fanti   Cancer [facility])    prostate   Chronic anticoagulation    Xarelto for afib   Chronic constipation    Chronic cystitis    Gross hematuria    History of DVT of lower extremity 06/08/2010   right femoral dvt dx post op total hip 05-08-2010   History of gastric ulcer    remote   History of kidney stones    one stone. remote hx   Hypercholesteremia    Hyperlipidemia    Hypertension    Dx by Dr. Lance Muss around age  81    Macular degeneration    dry   NICM (nonischemic cardiomyopathy) ([place])    per echo 06-11-2017,  ef 45-50% with diffuse hypokinesis,  G1DD   OSA on CPAP 2017   compliant with CPAP.  managed by Dr Elsworth Soho.   Osteoarthritis    Other urethral stricture, male, meatal    chronic;  pt self dilates   Paroxysmal atrial fibrillation [facility]) cardiologist-- dr Rayann Heman   first dx 2009/   a. documented by Dr Barrett Shell on office EKG 9/12. b. maintained on Tikosyn and Xarelto. c. s/p DCCV 's.  d.  x3  EP w/ ablation atrial fib last one 12-05-2016   Prediabetes    Premature ventricular contraction    Presence of Watchman left atrial appendage closure device 05/30/2022   Watchman FLX 41mm by Dr. Quentin Ore   PVC's (premature ventricular contractions)    RA (rheumatoid  arthritis) [facility])    rheumatologist--- Dr Page Spiro,  treated with Humira   RBBB (right bundle branch block)    Hx of a fib, ablation Aug 2018   Rheumatoid arthritis ([place])    Vitamin D deficiency    Wears hearing aid in both ears    Past Surgical History:  Procedure Laterality Date   ABLATION OF DYSRHYTHMIC FOCUS  08/29/2015   ATRIAL FIBRILLATION ABLATION  12/05/2016   ATRIAL FIBRILLATION ABLATION N/A 12/05/2016   Procedure: Atrial Fibrillation Ablation;  Surgeon: [name], MD;  Location: [place] [facility];  Service: Cardiovascular;  Laterality: N/A;   ATRIAL FIBRILLATION ABLATION N/A 02/29/2020   Procedure: ATRIAL FIBRILLATION ABLATION;  Surgeon: [name], MD;  Location: [place] [facility];  Service: Cardiovascular;  Laterality: N/A;   ATRIAL FLUTTER ABLATION  09/2010   by Glenford Bayley Right 1990s   CARDIOVERSION N/A 10/28/2012   Procedure: CARDIOVERSION;  Surgeon: Burnell Blanks, MD;  Location: [place];  Service: Cardiovascular;  Laterality: N/A;   CATARACT EXTRACTION W/ INTRAOCULAR LENS  IMPLANT, BILATERAL  2019   CYSTOSCOPY WITH BIOPSY N/A 09/03/2018   Procedure: CYSTOSCOPY WITH  FULGURATION [id number];  Surgeon: Franchot Gallo, MD;  Location: [facility];  Service: Urology;  Laterality: N/A;  [place] N/A 01/23/2022   Procedure: CYSTOSCOPY WITH FULGERATION OF PROSTATIC URETHRA;  Surgeon: Franchot Gallo, MD;  Location: [facility];  Service: Urology;  Laterality: N/A;  [place] N/A 08/29/2015   Procedure: Atrial Fibrillation Ablation;  Surgeon: [name], MD;  Location: [place] [facility];  Service: Cardiovascular;  Laterality: N/A;   KNEE SURGERY Bilateral x3   1960s & 1970s   open   LEFT ATRIAL APPENDAGE OCCLUSION N/A 05/30/2022   Procedure: LEFT ATRIAL APPENDAGE OCCLUSION;  Surgeon: Vickie Epley, MD;  Location: [place] [facility];  Service: Cardiovascular;  Laterality: N/A;    LEFT HEART CATHETERIZATION WITH CORONARY ANGIOGRAM N/A 06/07/2011   Procedure: LEFT HEART CATHETERIZATION WITH CORONARY ANGIOGRAM;  Surgeon: Peter M Martinique, MD;  Location: [facility] C[facility] LAB;  Service: Cardiovascular;  Laterality: N/A;    Normal coronary arteries,  low normal LVF   TEE WITHOUT CARDIOVERSION N/A 05/30/2022   Procedure: TRANSESOPHAGEAL ECHOCARDIOGRAM (TEE);  Surgeon: Vickie Epley, MD;  Location: [place] [facility];  Service: Cardiovascular;  Laterality: N/A;   TONSILLECTOMY AND ADENOIDECTOMY  age 45   TOTAL HIP ARTHROPLASTY Right 06/04/10    dr Noemi Chapel  @[facility]$    TOTAL KNEE ARTHROPLASTY Bilateral right 1995;  left [place]  06-29-2002   dr humphries  @[facility]$    TRANSURETHRAL RESECTION OF PROSTATE N/A 07/06/2018   Procedure: TRANSURETHRAL RESECTION OF THE PROSTATE (TURP);  Surgeon: Franchot Gallo, MD;  Location: [facility];  Service: Urology;  Laterality: N/A;  78 MINS   Patient Active Problem List   Diagnosis Date Noted   Atrial fibrillation ([place]) 05/30/2022   Presence of Watchman left atrial appendage closure device 05/30/2022   Hematuria 01/04/2022   Gross hematuria 01/04/2022   Secondary hypercoagulable state ([place]) 03/28/2020   Malignant neoplasm of prostate ([place]) 09/11/2018   Enlarged prostate with urinary obstruction 07/06/2018   Gastrocnemius strain, right, initial encounter 12/10/2017   Baker's cyst of knee, right 12/10/2017   Obesity 03/24/2017   Paroxysmal atrial fibrillation ([place]) 12/05/2016   Near syncope 04/20/2016   Leg hematoma, left, initial encounter 04/20/2016   Bleeding    Ecchymosis    Left hamstring muscle strain    Muscle tear    RBBB 0000000   Chronic systolic dysfunction of left ventricle 07/11/2014   OSA (obstructive sleep apnea) 04/07/2013   Multinodular goiter 01/20/2013   Cervical spondylosis without myelopathy 01/18/2013   Left ventricular dysfunction 12/17/2011   Low back pain 09/11/2011    Fatigue 05/22/2011   PVC (premature ventricular contraction) 01/18/2011   Persistent atrial fibrillation ([place]) 01/12/2011   Hyperlipidemia 08/02/2010   Hypertension 08/02/2010   Osteoarthritis 08/02/2010   BPH (benign prostatic hyperplasia) 08/02/2010   GERD (gastroesophageal reflux disease) 08/02/2010   h/o Atrial flutter ([place]) s/p ablation 2012     PCP: Copland, MD  REFERRING PROVIDER: Copland, MD  REFERRING DIAG: Debility  Rationale for Evaluation and Treatment: Rehabilitation  THERAPY DIAG:  Muscle weakness (generalized)  Difficulty in walking, not elsewhere classified  Unsteadiness on feet  ONSET DATE: August 2023  SUBJECTIVE:  SUBJECTIVE STATEMENT: Patient reports that he feels very fatigued, reports that he did the EKG on his watch and everything is fine, does report low RBC's during a test yesterday PAIN: has knee and hip issues with DJD  PRECAUTIONS: None  WEIGHT BEARING RESTRICTIONS: No  FALLS:  Has patient fallen in last 6 months? No  LIVING ENVIRONMENT: Lives with: lives with their family Lives in: House/apartment Stairs: Yes: Internal: 12 steps; can reach both Has following equipment at home: None  OCCUPATION: retired  PLOF: Independent  was working out at [employer] 2x/week  PATIENT GOALS: be stronger, move better, return to the gym  NEXT MD VISIT:   OBJECTIVE:   DIAGNOSTIC FINDINGS:  none  COGNITION: Overall cognitive status: Within functional limits for tasks assessed     SENSATION: WFL  MUSCLE LENGTH: Hamstrings: Right 45 deg; Left 45 deg POSTURE: rounded shoulders and forward head  PALPATION: Non tender  LUMBAR ROM: decreased 50% due to stiffness  LOWER EXTREMITY ROM:     Active  Right eval Left eval  Hip flexion    Hip extension    Hip  abduction    Hip adduction    Hip internal rotation    Hip external rotation    Knee flexion    Knee extension 10 15  Ankle dorsiflexion    Ankle plantarflexion    Ankle inversion    Ankle eversion     (Blank rows = not tested)  LOWER EXTREMITY MMT:    MMT Right eval Left eval  Hip flexion 3+ 3+  Hip extension 4- 4-  Hip abduction 3+ 3+  Hip adduction    Hip internal rotation    Hip external rotation    Knee flexion 4 4  Knee extension 3+ 3+  Ankle dorsiflexion 4 4  Ankle plantarflexion 3+ 3+  Ankle inversion    Ankle eversion     (Blank rows = not tested)   FUNCTIONAL TESTS:  5 times sit to stand: 25 seconds Timed up and go (TUG): 22  seconds 6 minute walk test:  had to stop at 3 minutes at 540', got very SOB and was unsteady   TUG 06/10/22 16 seconds   6 minute walk test 06/10/22 = 800 feet with SOB and one standing rest break  5XSTS 06/10/22 20 seconds BERG 43/56 = 06/19/22 GAIT: Distance walked: 100' Assistive device utilized: None Level of assistance: Complete Independence Comments: slow, WBOS, stairs one at a time  TODAY'S TREATMENT:                                                                                                                              DATE:  07/23/22 Bike level 4 x 6 mintues Nustep level 5 x 6 minutes 10# straight arm pulls 10# AR press 25# HS curls 5# leg extension 25# rows 25# lats On airex toe touches on cone UBE level 4 x 4 minutes Passive HS and piriformis stretch  Standing 2# hip abduction and extension  07/16/22 Walk around the parking island in the back, went down hill first and he only required one rest break, still c/o significant fatigue in the legs and he was breathing heavily Nustep level 5 x 6 minutes Leg curls 35# 2x15 10# straight arm pulls 15# AR press On airex balance beam side step and tandem walk On airex cone toe touches On airex ball toss On airex eyes closed Side step on and off airex Passive stretch HS  and piriformis Isometric abs with ball in lap  07/09/22 Walk around the parking island in the back, had to take 3 rest breaks due to fatigue in the legs and with his breathing Bike level 4 x 6 minutes Nustep level 5 x 6 minutes On airex ball toss Lat pulls 25# 2x10 Seated rows 25# 2x10 Leg curls 25# 3x10 Leg extension 5# 3x10 Passive HS and piriformis stretch  06/24/22 Nustep level 6 x 6 mintues Bike level 5 x 6 minutes Side step on and off airex,  Volley ball and then volleyball on the airex Walking figure 8's on mat Walking trying to not change speeds across the mat On airex cone toe and hand touches 2.5# hip abduction Leg curls 25# 3x10 Feet on ball K2C, trunk rotation, small bridge and isometric abs  PATIENT EDUCATION:  Education details: POC Person educated: Patient Education method: Explanation Education comprehension: verbalized understanding  HOME EXERCISE PROGRAM: TBD  ASSESSMENT:  CLINICAL IMPRESSION:  Patient continues to have issues with fatigue, his O2 saturation was 97%, his HR was 70, he reports that he did an EKG on his watch and he was in rhythm.  He does report a blood test last week with low hemoglobin, he does report that it was up from previous and he will be tested again next week.  HE was slow and lethargic with exercises today  OBJECTIVE IMPAIRMENTS: Abnormal gait, cardiopulmonary status limiting activity, decreased activity tolerance, decreased balance, decreased endurance, decreased mobility, difficulty walking, decreased ROM, decreased strength, and impaired flexibility.   REHAB POTENTIAL: Good  CLINICAL DECISION MAKING: Stable/uncomplicated  EVALUATION COMPLEXITY: Low   GOALS: Goals reviewed with patient? Yes  SHORT TERM GOALS: Target date: 04/22/22  Independent with initial HEP Goal status: met  LONG TERM GOALS: Target date: 07/03/22  Understand posture and body mechanics Goal status: met 07/16/22  2.  Decrease TUG time to 13  seconds Goal status: progressing  3.  Decrease 5x STS to 15 seconds Goal status: progressing  4.  Increase 6 minute walk test to 1200 feet Goal status:progressing  5.  Return safely to the gym Goal status: progressing  PLAN:  PT FREQUENCY: 1-2x/week  PT DURATION: 12 weeks  PLANNED INTERVENTIONS: Therapeutic exercises, Therapeutic activity, Neuromuscular re-education, Balance training, Gait training, Patient/Family education, Self Care, Joint mobilization, Stair training, and Manual therapy.  PLAN FOR NEXT SESSION:  struggling with fatigue, try to continue to push but see if his hemoglobin is rising   Theresa Wedel W, PT 07/23/2022, 1:55 PM

## 2022-07-24 ENCOUNTER — Encounter: Payer: Self-pay | Admitting: Cardiology

## 2022-07-24 ENCOUNTER — Ambulatory Visit: Payer: Medicare PPO | Attending: Cardiology | Admitting: Cardiology

## 2022-07-24 VITALS — BP 144/62 | HR 78 | Ht 72.0 in | Wt 219.8 lb

## 2022-07-24 DIAGNOSIS — I480 Paroxysmal atrial fibrillation: Secondary | ICD-10-CM

## 2022-07-24 DIAGNOSIS — Z95818 Presence of other cardiac implants and grafts: Secondary | ICD-10-CM

## 2022-07-24 DIAGNOSIS — I428 Other cardiomyopathies: Secondary | ICD-10-CM | POA: Diagnosis not present

## 2022-07-24 DIAGNOSIS — I10 Essential (primary) hypertension: Secondary | ICD-10-CM | POA: Diagnosis not present

## 2022-07-24 NOTE — Patient Instructions (Signed)
    Follow-Up: At [facility], you and your health needs are our priority.  As part of our continuing mission to provide you with exceptional heart care, we have created designated Provider Care Teams.  These Care Teams include your primary Cardiologist (physician) and Advanced Practice Providers (APPs -  Physician Assistants and Nurse Practitioners) who all work together to provide you with the care you need, when you need it.  We recommend signing up for the patient portal called "MyChart".  Sign up information is provided on this After Visit Summary.  MyChart is used to connect with patients for Virtual Visits (Telemedicine).  Patients are able to view lab/test results, encounter notes, upcoming appointments, etc.  Non-urgent messages can be sent to your provider as well.   To learn more about what you can do with MyChart, go to [email or web address].    Your next appointment:   6 month(s)  Provider:   Brian Crenshaw, MD      

## 2022-07-25 DIAGNOSIS — I480 Paroxysmal atrial fibrillation: Secondary | ICD-10-CM

## 2022-07-26 ENCOUNTER — Telehealth (HOSPITAL_COMMUNITY): Payer: Self-pay | Admitting: Emergency Medicine

## 2022-07-26 NOTE — Telephone Encounter (Signed)
Reaching out to patient to offer assistance regarding upcoming cardiac imaging study; pt verbalizes understanding of appt date/time, parking situation and where to check in, pre-test NPO status and medications ordered, and verified current allergies; name and call back number provided for further questions should they arise Connor Bond RN Navigator Cardiac Imaging [name] [phone] office [phone] cell  Arrival 1200 WC entrance Denies iv issues Daily meds Aware contrast

## 2022-07-29 ENCOUNTER — Ambulatory Visit (HOSPITAL_COMMUNITY)
Admission: RE | Admit: 2022-07-29 | Discharge: 2022-07-29 | Disposition: A | Discharge status: Home / Self Care | Payer: Medicare PPO | Source: Ambulatory Visit | Attending: Cardiology | Admitting: Cardiology

## 2022-07-29 DIAGNOSIS — I480 Paroxysmal atrial fibrillation: Secondary | ICD-10-CM | POA: Insufficient documentation

## 2022-07-29 DIAGNOSIS — Z95818 Presence of other cardiac implants and grafts: Secondary | ICD-10-CM | POA: Insufficient documentation

## 2022-07-29 MED ORDER — IOHEXOL 350 MG/ML SOLN
95.0000 mL | Freq: Once | INTRAVENOUS | Status: AC | PRN
  Administered 2022-07-29: 95 mL via INTRAVENOUS

## 2022-07-30 ENCOUNTER — Other Ambulatory Visit (INDEPENDENT_AMBULATORY_CARE_PROVIDER_SITE_OTHER): Payer: Medicare PPO

## 2022-07-30 ENCOUNTER — Encounter: Payer: Self-pay | Admitting: Family Medicine

## 2022-07-30 DIAGNOSIS — I10 Essential (primary) hypertension: Secondary | ICD-10-CM

## 2022-07-30 DIAGNOSIS — E782 Mixed hyperlipidemia: Secondary | ICD-10-CM | POA: Diagnosis not present

## 2022-07-30 LAB — CBC
HCT: 40.3 % (ref 39.0–52.0)
Hemoglobin: 13 g/dL (ref 13.0–17.0)
MCHC: 32.3 g/dL (ref 30.0–36.0)
MCV: 102.2 fl — ABNORMAL HIGH (ref 78.0–100.0)
Platelets: 134 10*3/uL — ABNORMAL LOW (ref 150.0–400.0)
RBC: 3.95 Mil/uL — ABNORMAL LOW (ref 4.22–5.81)
RDW: 17.9 % — ABNORMAL HIGH (ref 11.5–15.5)
WBC: 5.1 10*3/uL (ref 4.0–10.5)

## 2022-07-30 LAB — LIPID PANEL
Cholesterol: 132 mg/dL (ref 0–200)
HDL: 54.5 mg/dL (ref >39.00)
LDL Cholesterol: 61 mg/dL (ref 0–99)
NonHDL: 77.41
Total CHOL/HDL Ratio: 2
Triglycerides: 83 mg/dL (ref 0.0–149.0)
VLDL: 16.6 mg/dL (ref 0.0–40.0)

## 2022-07-31 ENCOUNTER — Encounter: Payer: Self-pay | Admitting: Physical Therapy

## 2022-07-31 ENCOUNTER — Ambulatory Visit: Payer: Medicare PPO | Admitting: Physical Therapy

## 2022-07-31 DIAGNOSIS — R2681 Unsteadiness on feet: Secondary | ICD-10-CM

## 2022-07-31 DIAGNOSIS — R262 Difficulty in walking, not elsewhere classified: Secondary | ICD-10-CM | POA: Diagnosis not present

## 2022-07-31 DIAGNOSIS — R252 Cramp and spasm: Secondary | ICD-10-CM | POA: Diagnosis not present

## 2022-07-31 DIAGNOSIS — M6281 Muscle weakness (generalized): Secondary | ICD-10-CM

## 2022-07-31 NOTE — Therapy (Signed)
OUTPATIENT PHYSICAL THERAPY THORACOLUMBAR TREATMENT    Patient Name: Connor [name] MRN: [id number] DOB:1933-11-30, 89 y.o., male Today's Date: 07/31/2022  END OF SESSION:  PT End of Session - 07/31/22 1021     Visit Number 19    Date for PT Re-Evaluation 08/09/22    Authorization Type [employer]    Authorization Time Period 9/10    PT Start Time 1015    PT Stop Time 1110    PT Time Calculation (min) 55 min    Activity Tolerance Patient tolerated treatment well    Behavior During Therapy WFL for tasks assessed/performed             Past Medical History:  Diagnosis Date   Benign localized prostatic hyperplasia with lower urinary tract symptoms (LUTS)    urologist-- dr Diona Fanti   Cancer [facility])    prostate   Chronic anticoagulation    Xarelto for afib   Chronic constipation    Chronic cystitis    Gross hematuria    History of DVT of lower extremity 06/08/2010   right femoral dvt dx post op total hip 05-08-2010   History of gastric ulcer    remote   History of kidney stones    one stone. remote hx   Hypercholesteremia    Hyperlipidemia    Hypertension    Dx by Dr. Lance Muss around age  38    Macular degeneration    dry   NICM (nonischemic cardiomyopathy) ([place])    per echo 06-11-2017,  ef 45-50% with diffuse hypokinesis,  G1DD   OSA on CPAP 2017   compliant with CPAP.  managed by Dr Elsworth Soho.   Osteoarthritis    Other urethral stricture, male, meatal    chronic;  pt self dilates   Paroxysmal atrial fibrillation [facility]) cardiologist-- dr Rayann Heman   first dx 2009/   a. documented by Dr Barrett Shell on office EKG 9/12. b. maintained on Tikosyn and Xarelto. c. s/p DCCV 's.  d.  x3  EP w/ ablation atrial fib last one 12-05-2016   Prediabetes    Premature ventricular contraction    Presence of Watchman left atrial appendage closure device 05/30/2022   Watchman FLX 30mm by Dr. Quentin Ore   PVC's (premature ventricular contractions)    RA (rheumatoid arthritis) [facility])     rheumatologist--- Dr Page Spiro,  treated with Humira   RBBB (right bundle branch block)    Hx of a fib, ablation Aug 2018   Rheumatoid arthritis ([place])    Vitamin D deficiency    Wears hearing aid in both ears    Past Surgical History:  Procedure Laterality Date   ABLATION OF DYSRHYTHMIC FOCUS  08/29/2015   ATRIAL FIBRILLATION ABLATION  12/05/2016   ATRIAL FIBRILLATION ABLATION N/A 12/05/2016   Procedure: Atrial Fibrillation Ablation;  Surgeon: [name], MD;  Location: [place] [facility];  Service: Cardiovascular;  Laterality: N/A;   ATRIAL FIBRILLATION ABLATION N/A 02/29/2020   Procedure: ATRIAL FIBRILLATION ABLATION;  Surgeon: [name], MD;  Location: [place] [facility];  Service: Cardiovascular;  Laterality: N/A;   ATRIAL FLUTTER ABLATION  09/2010   by Glenford Bayley Right 1990s   CARDIOVERSION N/A 10/28/2012   Procedure: CARDIOVERSION;  Surgeon: Burnell Blanks, MD;  Location: [place];  Service: Cardiovascular;  Laterality: N/A;   CATARACT EXTRACTION W/ INTRAOCULAR LENS  IMPLANT, BILATERAL  2019   CYSTOSCOPY WITH BIOPSY N/A 09/03/2018   Procedure: CYSTOSCOPY WITH FULGURATION Fran Lowes;  Surgeon: Franchot Gallo, MD;  Location: [facility];  Service: Urology;  Laterality: N/A;  [place] N/A 01/23/2022   Procedure: CYSTOSCOPY WITH FULGERATION OF PROSTATIC URETHRA;  Surgeon: Franchot Gallo, MD;  Location: [facility];  Service: Urology;  Laterality: N/A;  [place] N/A 08/29/2015   Procedure: Atrial Fibrillation Ablation;  Surgeon: [name], MD;  Location: [place] [facility];  Service: Cardiovascular;  Laterality: N/A;   KNEE SURGERY Bilateral x3   1960s & 1970s   open   LEFT ATRIAL APPENDAGE OCCLUSION N/A 05/30/2022   Procedure: LEFT ATRIAL APPENDAGE OCCLUSION;  Surgeon: Vickie Epley, MD;  Location: [place] [facility];  Service: Cardiovascular;  Laterality: N/A;   LEFT HEART  CATHETERIZATION WITH CORONARY ANGIOGRAM N/A 06/07/2011   Procedure: LEFT HEART CATHETERIZATION WITH CORONARY ANGIOGRAM;  Surgeon: Peter M Martinique, MD;  Location: [facility] CATH LAB;  Service: Cardiovascular;  Laterality: N/A;    Normal coronary arteries,  low normal LVF   TEE WITHOUT CARDIOVERSION N/A 05/30/2022   Procedure: TRANSESOPHAGEAL ECHOCARDIOGRAM (TEE);  Surgeon: Vickie Epley, MD;  Location: [place] [facility] LAB;  Service: Cardiovascular;  Laterality: N/A;   TONSILLECTOMY AND ADENOIDECTOMY  age 47   TOTAL HIP ARTHROPLASTY Right 06/04/10    dr Noemi Chapel  @[facility]    TOTAL KNEE ARTHROPLASTY Bilateral right 1995;  left [place]  06-29-2002   dr humphries  @[facility]    TRANSURETHRAL RESECTION OF PROSTATE N/A 07/06/2018   Procedure: TRANSURETHRAL RESECTION OF THE PROSTATE (TURP);  Surgeon: Franchot Gallo, MD;  Location: [facility];  Service: Urology;  Laterality: N/A;  3 MINS   Patient Active Problem List   Diagnosis Date Noted   Atrial fibrillation ([place]) 05/30/2022   Presence of Watchman left atrial appendage closure device 05/30/2022   Hematuria 01/04/2022   Gross hematuria 01/04/2022   Secondary hypercoagulable state ([place]) 03/28/2020   Malignant neoplasm of prostate ([place]) 09/11/2018   Enlarged prostate with urinary obstruction 07/06/2018   Gastrocnemius strain, right, initial encounter 12/10/2017   Baker's cyst of knee, right 12/10/2017   Obesity 03/24/2017   Paroxysmal atrial fibrillation ([place]) 12/05/2016   Near syncope 04/20/2016   Leg hematoma, left, initial encounter 04/20/2016   Bleeding    Ecchymosis    Left hamstring muscle strain    Muscle tear    RBBB 0000000   Chronic systolic dysfunction of left ventricle 07/11/2014   OSA (obstructive sleep apnea) 04/07/2013   Multinodular goiter 01/20/2013   Cervical spondylosis without myelopathy 01/18/2013   Left ventricular dysfunction 12/17/2011   Low back pain 09/11/2011   Fatigue  05/22/2011   PVC (premature ventricular contraction) 01/18/2011   Persistent atrial fibrillation ([place]) 01/12/2011   Hyperlipidemia 08/02/2010   Hypertension 08/02/2010   Osteoarthritis 08/02/2010   BPH (benign prostatic hyperplasia) 08/02/2010   GERD (gastroesophageal reflux disease) 08/02/2010   h/o Atrial flutter ([place]) s/p ablation 2012     PCP: Copland, MD  REFERRING PROVIDER: Copland, MD  REFERRING DIAG: Debility  Rationale for Evaluation and Treatment: Rehabilitation  THERAPY DIAG:  Muscle weakness (generalized)  Difficulty in walking, not elsewhere classified  Unsteadiness on feet  Cramp and spasm  ONSET DATE: August 2023  SUBJECTIVE:  SUBJECTIVE STATEMENT: Reports that he had more bloodwork and hemoglobin is back up, he reports that he still feels weak in the legs and fatigues easily PAIN: has knee and hip issues with DJD  PRECAUTIONS: None  WEIGHT BEARING RESTRICTIONS: No  FALLS:  Has patient fallen in last 6 months? No  LIVING ENVIRONMENT: Lives with: lives with their family Lives in: House/apartment Stairs: Yes: Internal: 12 steps; can reach both Has following equipment at home: None  OCCUPATION: retired  PLOF: Independent  was working out at [employer] 2x/week  PATIENT GOALS: be stronger, move better, return to the gym  NEXT MD VISIT:   OBJECTIVE:   DIAGNOSTIC FINDINGS:  none  COGNITION: Overall cognitive status: Within functional limits for tasks assessed     SENSATION: WFL  MUSCLE LENGTH: Hamstrings: Right 45 deg; Left 45 deg POSTURE: rounded shoulders and forward head  PALPATION: Non tender  LUMBAR ROM: decreased 50% due to stiffness  LOWER EXTREMITY ROM:     Active  Right eval Left eval  Hip flexion    Hip extension    Hip abduction    Hip  adduction    Hip internal rotation    Hip external rotation    Knee flexion    Knee extension 10 15  Ankle dorsiflexion    Ankle plantarflexion    Ankle inversion    Ankle eversion     (Blank rows = not tested)  LOWER EXTREMITY MMT:    MMT Right eval Left eval  Hip flexion 3+ 3+  Hip extension 4- 4-  Hip abduction 3+ 3+  Hip adduction    Hip internal rotation    Hip external rotation    Knee flexion 4 4  Knee extension 3+ 3+  Ankle dorsiflexion 4 4  Ankle plantarflexion 3+ 3+  Ankle inversion    Ankle eversion     (Blank rows = not tested)   FUNCTIONAL TESTS:  5 times sit to stand: 25 seconds Timed up and go (TUG): 22  seconds 6 minute walk test:  had to stop at 3 minutes at 540', got very SOB and was unsteady   TUG 06/10/22 16 seconds   6 minute walk test 06/10/22 = 800 feet with SOB and one standing rest break  5XSTS 06/10/22 20 seconds BERG 43/56 = 06/19/22 GAIT: Distance walked: 100' Assistive device utilized: None Level of assistance: Complete Independence Comments: slow, WBOS, stairs one at a time  TODAY'S TREATMENT:                                                                                                                              DATE:  07/31/22 Bike level 4 x 6 minutes UBE level 4 x 6 minutes Nustep level 5 x 6 minutes 10# straight arm pulls on airex 25# rows 3x10 25# lats 3x10 25# HS curls 3x10 5# leg extension small ROM Reviewed HEP and possible gym exercises that he could do safely,  talked about activities that are safe and how to slowly progress  07/23/22 Bike level 4 x 6 mintues Nustep level 5 x 6 minutes 10# straight arm pulls 10# AR press 25# HS curls 5# leg extension 25# rows 25# lats On airex toe touches on cone UBE level 4 x 4 minutes Passive HS and piriformis stretch Standing 2# hip abduction and extension  07/16/22 Walk around the parking island in the back, went down hill first and he only required one rest break, still c/o  significant fatigue in the legs and he was breathing heavily Nustep level 5 x 6 minutes Leg curls 35# 2x15 10# straight arm pulls 15# AR press On airex balance beam side step and tandem walk On airex cone toe touches On airex ball toss On airex eyes closed Side step on and off airex Passive stretch HS and piriformis Isometric abs with ball in lap  07/09/22 Walk around the parking island in the back, had to take 3 rest breaks due to fatigue in the legs and with his breathing Bike level 4 x 6 minutes Nustep level 5 x 6 minutes On airex ball toss Lat pulls 25# 2x10 Seated rows 25# 2x10 Leg curls 25# 3x10 Leg extension 5# 3x10 Passive HS and piriformis stretch  06/24/22 Nustep level 6 x 6 mintues Bike level 5 x 6 minutes Side step on and off airex,  Volley ball and then volleyball on the airex Walking figure 8's on mat Walking trying to not change speeds across the mat On airex cone toe and hand touches 2.5# hip abduction Leg curls 25# 3x10 Feet on ball K2C, trunk rotation, small bridge and isometric abs  PATIENT EDUCATION:  Education details: POC Person educated: Patient Education method: Explanation Education comprehension: verbalized understanding  HOME EXERCISE PROGRAM: TBD  ASSESSMENT:  CLINICAL IMPRESSION:  Patient seems to be doing well, he is still on one blood thinner and feels that he will have to be on it through the summer and then may get to come off, his hemoglobin is back up, he still struggles with fatigue after about 200 feet of walking  OBJECTIVE IMPAIRMENTS: Abnormal gait, cardiopulmonary status limiting activity, decreased activity tolerance, decreased balance, decreased endurance, decreased mobility, difficulty walking, decreased ROM, decreased strength, and impaired flexibility.   REHAB POTENTIAL: Good  CLINICAL DECISION MAKING: Stable/uncomplicated  EVALUATION COMPLEXITY: Low   GOALS: Goals reviewed with patient? Yes  SHORT TERM GOALS:  Target date: 04/22/22  Independent with initial HEP Goal status: met  LONG TERM GOALS: Target date: 07/03/22  Understand posture and body mechanics Goal status: met 07/16/22  2.  Decrease TUG time to 13 seconds Goal status: met  3.  Decrease 5x STS to 15 seconds Goal status: met  4.  Increase 6 minute walk test to 1200 feet Goal status:not met  5.  Return safely to the gym Goal status: met  PLAN:  PT FREQUENCY: 1-2x/week  PT DURATION: 12 weeks  PLANNED INTERVENTIONS: Therapeutic exercises, Therapeutic activity, Neuromuscular re-education, Balance training, Gait training, Patient/Family education, Self Care, Joint mobilization, Stair training, and Manual therapy.  PLAN FOR NEXT SESSION:  D/C goals met with him trying on his own at home    Sumner Boast, PT 07/31/2022, 10:22 AM

## 2022-07-31 NOTE — Telephone Encounter (Signed)
Labs have been faxed.

## 2022-08-08 DIAGNOSIS — I739 Peripheral vascular disease, unspecified: Secondary | ICD-10-CM | POA: Diagnosis not present

## 2022-08-08 DIAGNOSIS — B351 Tinea unguium: Secondary | ICD-10-CM | POA: Diagnosis not present

## 2022-08-08 DIAGNOSIS — M21962 Unspecified acquired deformity of left lower leg: Secondary | ICD-10-CM | POA: Diagnosis not present

## 2022-08-08 DIAGNOSIS — M21961 Unspecified acquired deformity of right lower leg: Secondary | ICD-10-CM | POA: Diagnosis not present

## 2022-08-08 DIAGNOSIS — L603 Nail dystrophy: Secondary | ICD-10-CM | POA: Diagnosis not present

## 2022-08-08 DIAGNOSIS — L84 Corns and callosities: Secondary | ICD-10-CM | POA: Diagnosis not present

## 2022-08-20 DIAGNOSIS — H353132 Nonexudative age-related macular degeneration, bilateral, intermediate dry stage: Secondary | ICD-10-CM | POA: Diagnosis not present

## 2022-08-23 ENCOUNTER — Telehealth: Payer: Self-pay

## 2022-08-23 NOTE — Telephone Encounter (Signed)
The pt called into the office with concerns about plavix effecting his eyes.  The patient has macular degeneration in his left eye and currently it is "dry". Last night when the pt got up to use the restroom he noticed floaters and they were "very red". Per patient this is the first time he has experienced this. The pt does follow with Dr Elmer Picker but has not contacted the office in regards to these symptoms. Today the pt's vision is okay and he has not had any floaters. The patient has also noticed that he is bruising easily since transitioning to plavix on 3/11 (pt stopped Xarelto 3/10, s/p LAAO).  Recent PLT count 134 by 3/26 labs, the pt does have a history of low platelet counts last year. Discussed with Georgie Chard NP and she advised that the pt contact Dr [facility] office to discuss the new symptom.  If Dr Elmer Picker felt that plavix could be causing an issue with eyes then we could discuss transitioning the pt back to Xarelto.   I made the pt aware of this information and he has an appointment with Dr Elmer Picker in the next 1-2 weeks.  He will discuss concerns and if Dr Elmer Picker feels that the plavix is causing an issue then the patient will contact us for further recommendations.

## 2022-09-05 DIAGNOSIS — H353132 Nonexudative age-related macular degeneration, bilateral, intermediate dry stage: Secondary | ICD-10-CM | POA: Diagnosis not present

## 2022-09-05 DIAGNOSIS — H04123 Dry eye syndrome of bilateral lacrimal glands: Secondary | ICD-10-CM | POA: Diagnosis not present

## 2022-09-05 DIAGNOSIS — H35033 Hypertensive retinopathy, bilateral: Secondary | ICD-10-CM | POA: Diagnosis not present

## 2022-09-05 DIAGNOSIS — H40013 Open angle with borderline findings, low risk, bilateral: Secondary | ICD-10-CM | POA: Diagnosis not present

## 2022-09-07 ENCOUNTER — Other Ambulatory Visit (HOSPITAL_COMMUNITY): Payer: Self-pay | Admitting: Physician Assistant

## 2022-09-16 ENCOUNTER — Ambulatory Visit: Payer: Medicare PPO | Admitting: Family Medicine

## 2022-09-19 DIAGNOSIS — H353132 Nonexudative age-related macular degeneration, bilateral, intermediate dry stage: Secondary | ICD-10-CM | POA: Diagnosis not present

## 2022-09-24 LAB — CBC AND DIFFERENTIAL
HCT: 40 — AB (ref 41–53)
Hemoglobin: 13 — AB (ref 13.5–17.5)
Platelets: 124 10*3/uL — AB (ref 150–400)
WBC: 3.8

## 2022-09-24 LAB — CBC: RBC: 3.82 — AB (ref 3.87–5.11)

## 2022-09-25 NOTE — Telephone Encounter (Signed)
Called and rescheduled the patient for 6 month LAAO visit on 12/02/2022. He was grateful for call and agreed with plan.

## 2022-10-04 DIAGNOSIS — K921 Melena: Secondary | ICD-10-CM | POA: Diagnosis not present

## 2022-10-08 ENCOUNTER — Telehealth: Payer: Self-pay | Admitting: Family Medicine

## 2022-10-08 NOTE — Telephone Encounter (Signed)
Connor [name] says the Pts mcv was elevated and provider asks for a folate and B12 to be collected when he see [place] on 6/12 to avoid extra commute. Verbal given, but she will be faxing an order as well.

## 2022-10-08 NOTE — Progress Notes (Unsigned)
[facility] [address] [place], [place] [address] [phone]  Date:  10/16/2022   Name:  Connor [name]   DOB:  1934-01-18   MRN:  [id number]  PCP:  Pearline Cables, MD    Chief Complaint: No chief complaint on file.   History of Present Illness:  Connor [name] is a 89 y.o. very pleasant male patient who presents with the following:  Patient seen today for short-term follow-up Most recent visit with myself was in February  History of atrial fibrillation, left ventricle dysfunction, prostate cancer, seronegative rheumatoid arthritis and osteoarthritis, sleep apnea.  Has some difficulty using his CPAP and typically uses it for just an hour or 2 nightly He does IM humira weekly for his RA In 2023 he was admitted with gross hematuria which required treatment per urology Most recent visit with urology was earlier this year-February 2024  His gastroenterologist recently requested a B12 and folate level due to macrocytosis- Dr Lorenso Quarry, fax to [phone] attn Harriett Sine T CMA  He was seen by Dr. Jens Som on 07/27/2022 to follow-up his Watchman device-he has been able to stop Xarelto though he does continue Plavix (needs to complete 6 months) 1 paroxysmal atrial fibrillation-patient has had 3 prior ablations and remains in sinus rhythm on exam.  Continue Toprol.  He is now status post Watchman device given history of recurrent hematuria.  He is on Plavix to complete full 6 months of anticoagulation following recent Watchman device. 2 hypertension-patient's blood pressure is controlled.  Continue present medical regimen. 3 hyperlipidemia-continue statin. 4 nonischemic cardiomyopathy-LV function has normalized on most recent echocardiogram. 5 obstructive sleep apnea-continue CPAP. 6 obesity-we previously discussed the importance of weight loss.   Atorvastatin 10 Lisinopril 40 Toprol-XL, 25 twice daily Plavix  75 Finasteride 5 Tamsulosin 0.4 Testosterone topical Pantoprazole Humira 40 mg once weekly Patient Active Problem List   Diagnosis Date Noted   Atrial fibrillation (HCC) 05/30/2022   Presence of Watchman left atrial appendage closure device 05/30/2022   Hematuria 01/04/2022   Gross hematuria 01/04/2022   Secondary hypercoagulable state (HCC) 03/28/2020   Malignant neoplasm of prostate (HCC) 09/11/2018   Enlarged prostate with urinary obstruction 07/06/2018   Gastrocnemius strain, right, initial encounter 12/10/2017   Baker's cyst of knee, right 12/10/2017   Obesity 03/24/2017   Paroxysmal atrial fibrillation (HCC) 12/05/2016   Near syncope 04/20/2016   Leg hematoma, left, initial encounter 04/20/2016   Bleeding    Ecchymosis    Left hamstring muscle strain    Muscle tear    RBBB 08/30/2015   Chronic systolic dysfunction of left ventricle 07/11/2014   OSA (obstructive sleep apnea) 04/07/2013   Multinodular goiter 01/20/2013   Cervical spondylosis without myelopathy 01/18/2013   Left ventricular dysfunction 12/17/2011   Low back pain 09/11/2011   Fatigue 05/22/2011   PVC (premature ventricular contraction) 01/18/2011   Persistent atrial fibrillation (HCC) 01/12/2011   Hyperlipidemia 08/02/2010   Hypertension 08/02/2010   Osteoarthritis 08/02/2010   BPH (benign prostatic hyperplasia) 08/02/2010   GERD (gastroesophageal reflux disease) 08/02/2010   h/o Atrial flutter (HCC) s/p ablation 2012     Past Medical History:  Diagnosis Date   Benign localized prostatic hyperplasia with lower urinary tract symptoms (LUTS)    urologist-- dr Retta Diones   Cancer [facility])    prostate   Chronic anticoagulation    Xarelto for afib   Chronic constipation    Chronic cystitis  Gross hematuria    History of DVT of lower extremity 06/08/2010   right femoral dvt dx post op total hip 05-08-2010   History of gastric ulcer    remote   History of kidney stones    one stone. remote hx    Hypercholesteremia    Hyperlipidemia    Hypertension    Dx by Dr. Champ Mungo around age  70    Macular degeneration    dry   NICM (nonischemic cardiomyopathy) (HCC)    per echo 06-11-2017,  ef 45-50% with diffuse hypokinesis,  G1DD   OSA on CPAP 2017   compliant with CPAP.  managed by Dr Vassie Loll.   Osteoarthritis    Other urethral stricture, male, meatal    chronic;  pt self dilates   Paroxysmal atrial fibrillation [facility]) cardiologist-- dr Johney Frame   first dx 2009/   a. documented by Dr Otho Perl on office EKG 9/12. b. maintained on Tikosyn and Xarelto. c. s/p DCCV 's.  d.  x3  EP w/ ablation atrial fib last one 12-05-2016   Prediabetes    Premature ventricular contraction    Presence of Watchman left atrial appendage closure device 05/30/2022   Watchman FLX 24mm by Dr. Lalla Brothers   PVC's (premature ventricular contractions)    RA (rheumatoid arthritis) [facility])    rheumatologist--- Dr Fontaine No,  treated with Humira   RBBB (right bundle branch block)    Hx of a fib, ablation Aug 2018   Rheumatoid arthritis (HCC)    Vitamin D deficiency    Wears hearing aid in both ears     Past Surgical History:  Procedure Laterality Date   ABLATION OF DYSRHYTHMIC FOCUS  08/29/2015   ATRIAL FIBRILLATION ABLATION  12/05/2016   ATRIAL FIBRILLATION ABLATION N/A 12/05/2016   Procedure: Atrial Fibrillation Ablation;  Surgeon: Hillis Range, MD;  Location: [facility] INVASIVE CV LAB;  Service: Cardiovascular;  Laterality: N/A;   ATRIAL FIBRILLATION ABLATION N/A 02/29/2020   Procedure: ATRIAL FIBRILLATION ABLATION;  Surgeon: Hillis Range, MD;  Location: [facility] INVASIVE CV LAB;  Service: Cardiovascular;  Laterality: N/A;   ATRIAL FLUTTER ABLATION  09/2010   by Karren Burly Right 1990s   CARDIOVERSION N/A 10/28/2012   Procedure: CARDIOVERSION;  Surgeon: Kathleene Hazel, MD;  Location[facility] OR;  Service: Cardiovascular;  Laterality: N/A;   CATARACT EXTRACTION W/ INTRAOCULAR LENS  IMPLANT, BILATERAL  2019    CYSTOSCOPY WITH BIOPSY N/A 09/03/2018   Procedure: CYSTOSCOPY WITH FULGURATION Coralee Pesa;  Surgeon: Marcine Matar, MD;  Location: [facility] ORS;  Service: Urology;  Laterality: N/A;  30 MINS   CYSTOSCOPY WITH FULGERATION N/A 01/23/2022   Procedure: CYSTOSCOPY WITH FULGERATION OF PROSTATIC URETHRA;  Surgeon: Marcine Matar, MD;  Location: [facility] ORS;  Service: Urology;  Laterality: N/A;  30 MINS   DENTAL SURGERY     ELECTROPHYSIOLOGIC STUDY N/A 08/29/2015   Procedure: Atrial Fibrillation Ablation;  Surgeon: Hillis Range, MD;  [facility] INVASIVE CV LAB;  Service: Cardiovascular;  Laterality: N/A;   KNEE SURGERY Bilateral x3   1960s & 1970s   open   LEFT ATRIAL APPENDAGE OCCLUSION N/A 05/30/2022   Procedure: LEFT ATRIAL APPENDAGE OCCLUSION;  Surgeon: Lanier Prude, MD;  Location: [facility] INVASIVE CV LAB;  Service: Cardiovascular;  Laterality: N/A;   LEFT HEART CATHETERIZATION WITH CORONARY ANGIOGRAM N/A 06/07/2011   Procedure: LEFT HEART CATHETERIZATION WITH CORONARY ANGIOGRAM;  Surgeon: Peter M Swaziland, MD;  Location: [facility] CATH LAB;  Service: Cardiovascular;  Laterality: N/A;    Normal  coronary arteries,  low normal LVF   TEE WITHOUT CARDIOVERSION N/A 05/30/2022   Procedure: TRANSESOPHAGEAL ECHOCARDIOGRAM (TEE);  Surgeon: Lanier Prude, MD;  Location: [facility] INVASIVE CV LAB;  Service: Cardiovascular;  Laterality: N/A;   TONSILLECTOMY AND ADENOIDECTOMY  age 4   TOTAL HIP ARTHROPLASTY Right 06/04/10    dr Thurston Hole  @[facility]    TOTAL KNEE ARTHROPLASTY Bilateral right 1995;  left 1997   TRANSURETHRAL RESECTION OF PROSTATE  06-29-2002   dr humphries  @[facility]    TRANSURETHRAL RESECTION OF PROSTATE N/A 07/06/2018   Procedure: TRANSURETHRAL RESECTION OF THE PROSTATE (TURP);  Surgeon: Marcine Matar, MD;  Location: [facility];  Service: Urology;  Laterality: N/A;  45 MINS    Social History   Tobacco Use   Smoking status: Former    Packs/day: 1.00    Years: 30.00    Additional pack years: 0.00     Total pack years: 30.00    Types: Cigarettes    Quit date: 05/06/1978    Years since quitting: 44.4   Smokeless tobacco: Never   Tobacco comments:    Former smoker 03/14/2021  Vaping Use   Vaping Use: Never used  Substance Use Topics   Alcohol use: Yes    Alcohol/week: 1.0 standard drink of alcohol    Types: 1 Cans of beer per week    Comment: occasionally wine and beer   Drug use: Never    Family History  Problem Relation Age of Onset   Heart attack Father    Lung disease Mother    Arrhythmia Sister    Atrial fibrillation Brother    Diabetes Neg Hx     Allergies  Allergen Reactions   Penicillins Itching, Swelling and Other (See Comments)    Did it involve swelling of the face/tongue/throat, SOB, or low BP? No Did it involve sudden or severe rash/hives, skin peeling, or any reaction on the inside of your mouth or nose? No Did you need to seek medical attention at a hospital or doctor's office? No When did it last happen?      30 + years If all above answers are "NO", may proceed with cephalosporin use.      Medication list has been reviewed and updated.  Current Outpatient Medications on File Prior to Visit  Medication Sig Dispense Refill   acetaminophen (TYLENOL) 500 MG tablet Take 1,000 mg by mouth 2 (two) times daily as needed for moderate pain.     atorvastatin (LIPITOR) 10 MG tablet TAKE 1 TABLET BY MOUTH EVERY DAY 90 tablet 3   Carboxymethylcellulose Sodium (THERATEARS OP) Apply 1 drop to eye daily as needed (dry eyes).     cephALEXin (KEFLEX) 500 MG capsule Take 2,000 mg by mouth as directed. Dental appointments     Cholecalciferol (VITAMIN D3) 5000 UNITS TABS Take 5,000 Units by mouth every morning.     clopidogrel (PLAVIX) 75 MG tablet Take 1 tablet (75 mg total) by mouth daily. Start on 3/11 and stop Xarelto on 3/10. 90 tablet 3   Cranberry 1000 MG CAPS Take 4,000 mg by mouth 2 (two) times daily.     finasteride (PROSCAR) 5 MG tablet Take 5 mg by mouth  every morning.   3   HUMIRA PEN 40 MG/0.4ML PNKT Inject 40 mg as directed every 7 (seven) days. Every Tuesday  6   Iron Combinations (CHROMAGEN) capsule Take 1 capsule by mouth daily.     lisinopril (ZESTRIL) 40 MG tablet TAKE 1 TABLET BY MOUTH  EVERY DAY 90 tablet 3   Melatonin 10 MG CHEW Chew 3 Pieces of gum by mouth at bedtime. gummy     metoprolol succinate (TOPROL-XL) 50 MG 24 hr tablet TAKE 1/2 TABLET IN THE MORNING AND TAKE 1 TABLET IN THE EVENING 135 tablet 3   Multiple Vitamins-Minerals (PRESERVISION AREDS 2) CAPS Take 1 capsule by mouth 2 (two) times a day.     pantoprazole (PROTONIX) 40 MG tablet Take 1 tablet (40 mg total) by mouth daily. 60 tablet 2   senna (SENOKOT) 8.6 MG TABS tablet Take 2 tablets by mouth at bedtime. Per patient taking 2 tablets daily     solifenacin (VESICARE) 10 MG tablet Take 10 mg by mouth at bedtime.      tamsulosin (FLOMAX) 0.4 MG CAPS capsule Take 1 capsule (0.4 mg total) by mouth daily after supper. 30 capsule 5   Testosterone 20.25 MG/ACT (1.62%) GEL Apply 1 Pump topically at bedtime. Apply to shoulders     vitamin B-12 (CYANOCOBALAMIN) 500 MCG tablet Take 500 mcg by mouth every evening.      No current facility-administered medications on file prior to visit.    Review of Systems:  ***  Physical Examination: There were no vitals filed for this visit. There were no vitals filed for this visit. There is no height or weight on file to calculate BMI. Ideal Body Weight:    ***  Assessment and Plan: ***  Signed Abbe Amsterdam, MD

## 2022-10-13 NOTE — Patient Instructions (Incomplete)
It was great to see you again today, hope you have a wonderful summer.  Please see me in 4 to 6 months assuming all is well  I will send your B12 and folate levels to Dr Lorenso Quarry

## 2022-10-15 DIAGNOSIS — E669 Obesity, unspecified: Secondary | ICD-10-CM | POA: Diagnosis not present

## 2022-10-15 DIAGNOSIS — R5383 Other fatigue: Secondary | ICD-10-CM | POA: Diagnosis not present

## 2022-10-15 DIAGNOSIS — D508 Other iron deficiency anemias: Secondary | ICD-10-CM | POA: Diagnosis not present

## 2022-10-15 DIAGNOSIS — Z79899 Other long term (current) drug therapy: Secondary | ICD-10-CM | POA: Diagnosis not present

## 2022-10-15 DIAGNOSIS — M1991 Primary osteoarthritis, unspecified site: Secondary | ICD-10-CM | POA: Diagnosis not present

## 2022-10-15 DIAGNOSIS — M0600 Rheumatoid arthritis without rheumatoid factor, unspecified site: Secondary | ICD-10-CM | POA: Diagnosis not present

## 2022-10-15 DIAGNOSIS — Z6830 Body mass index (BMI) 30.0-30.9, adult: Secondary | ICD-10-CM | POA: Diagnosis not present

## 2022-10-16 ENCOUNTER — Ambulatory Visit: Payer: Medicare PPO | Admitting: Family Medicine

## 2022-10-16 VITALS — BP 118/62 | HR 60 | Temp 97.7°F | Resp 18 | Ht 72.0 in | Wt 218.4 lb

## 2022-10-16 DIAGNOSIS — D7589 Other specified diseases of blood and blood-forming organs: Secondary | ICD-10-CM

## 2022-10-16 DIAGNOSIS — E782 Mixed hyperlipidemia: Secondary | ICD-10-CM

## 2022-10-16 DIAGNOSIS — I480 Paroxysmal atrial fibrillation: Secondary | ICD-10-CM

## 2022-10-16 DIAGNOSIS — I10 Essential (primary) hypertension: Secondary | ICD-10-CM | POA: Diagnosis not present

## 2022-10-16 LAB — FOLATE: Folate: 19.8 ng/mL (ref >5.9)

## 2022-10-16 LAB — VITAMIN B12: Vitamin B-12: 1011 pg/mL — ABNORMAL HIGH (ref 211–911)

## 2022-10-17 ENCOUNTER — Encounter: Payer: Self-pay | Admitting: Family Medicine

## 2022-10-17 ENCOUNTER — Other Ambulatory Visit: Payer: Self-pay | Admitting: Family Medicine

## 2022-10-17 DIAGNOSIS — E785 Hyperlipidemia, unspecified: Secondary | ICD-10-CM

## 2022-10-17 NOTE — Progress Notes (Signed)
This has been faxed.

## 2022-10-19 DIAGNOSIS — H353132 Nonexudative age-related macular degeneration, bilateral, intermediate dry stage: Secondary | ICD-10-CM | POA: Diagnosis not present

## 2022-11-14 DIAGNOSIS — L603 Nail dystrophy: Secondary | ICD-10-CM | POA: Diagnosis not present

## 2022-11-14 DIAGNOSIS — B351 Tinea unguium: Secondary | ICD-10-CM | POA: Diagnosis not present

## 2022-11-14 DIAGNOSIS — M21962 Unspecified acquired deformity of left lower leg: Secondary | ICD-10-CM | POA: Diagnosis not present

## 2022-11-14 DIAGNOSIS — I739 Peripheral vascular disease, unspecified: Secondary | ICD-10-CM | POA: Diagnosis not present

## 2022-11-14 DIAGNOSIS — M21961 Unspecified acquired deformity of right lower leg: Secondary | ICD-10-CM | POA: Diagnosis not present

## 2022-11-14 DIAGNOSIS — L84 Corns and callosities: Secondary | ICD-10-CM | POA: Diagnosis not present

## 2022-11-18 DIAGNOSIS — H353132 Nonexudative age-related macular degeneration, bilateral, intermediate dry stage: Secondary | ICD-10-CM | POA: Diagnosis not present

## 2022-11-27 ENCOUNTER — Ambulatory Visit: Payer: Medicare PPO

## 2022-12-02 ENCOUNTER — Ambulatory Visit: Payer: Medicare PPO | Attending: Cardiology | Admitting: Cardiology

## 2022-12-02 VITALS — BP 122/70 | HR 62 | Ht 72.0 in | Wt 222.0 lb

## 2022-12-02 DIAGNOSIS — R58 Hemorrhage, not elsewhere classified: Secondary | ICD-10-CM | POA: Diagnosis not present

## 2022-12-02 DIAGNOSIS — I428 Other cardiomyopathies: Secondary | ICD-10-CM

## 2022-12-02 DIAGNOSIS — I10 Essential (primary) hypertension: Secondary | ICD-10-CM

## 2022-12-02 DIAGNOSIS — G4733 Obstructive sleep apnea (adult) (pediatric): Secondary | ICD-10-CM

## 2022-12-02 DIAGNOSIS — I480 Paroxysmal atrial fibrillation: Secondary | ICD-10-CM | POA: Diagnosis not present

## 2022-12-02 DIAGNOSIS — Z95818 Presence of other cardiac implants and grafts: Secondary | ICD-10-CM | POA: Diagnosis not present

## 2022-12-02 MED ORDER — ASPIRIN 81 MG PO TBEC: 81.0000 mg | DELAYED_RELEASE_TABLET | Freq: Every day | ORAL | Status: DC

## 2022-12-02 NOTE — Patient Instructions (Addendum)
Medication Instructions:  Your physician has recommended you make the following change in your medication:  STOP PLAVIX   START ASPIRIN 81 MG DAILY  *If you need a refill on your cardiac medications before your next appointment, please call your pharmacy*   Lab Work: NONE If you have labs (blood work) drawn today and your tests are completely normal, you will receive your results only by: MyChart Message (if you have MyChart) OR A paper copy in the mail If you have any lab test that is abnormal or we need to change your treatment, we will call you to review the results.   Testing/Procedures: NONE   Follow-Up: At [facility], you and your health needs are our priority.  As part of our continuing mission to provide you with exceptional heart care, we have created designated Provider Care Teams.  These Care Teams include your primary Cardiologist (physician) and Advanced Practice Providers (APPs -  Physician Assistants and Nurse Practitioners) who all work together to provide you with the care you need, when you need it.  We recommend signing up for the patient portal called "MyChart".  Sign up information is provided on this After Visit Summary.  MyChart is used to connect with patients for Virtual Visits (Telemedicine).  Patients are able to view lab/test results, encounter notes, upcoming appointments, etc.  Non-urgent messages can be sent to your provider as well.   To learn more about what you can do with MyChart, go to [email or web address].    Your next appointment:   KEEP SCHEDULED FOLLOW-UP

## 2022-12-02 NOTE — Progress Notes (Signed)
[facility]                                     Cardiology Office Note:    Date:  12/02/2022   ID:  Connor [name], DOB [birth year], MRN [id number]  PCP:  Pearline Cables, MD  [facility] Cardiologist:  Olga Millers, MD  [facility] Electrophysiologist:  Lanier Prude, MD   Referring MD: Pearline Cables, MD   Chief Complaint  Patient presents with   Follow-up    6 month s/p LAAO    History of Present Illness:    Connor [name] is a 89 y.o. male with a hx of prostate CA, provoked DVT (2012), PVCs and RBBB, A Flutter Ablation x 1, A Fib Ablation x 3, HTN, HLD, OSA on CPAP, obesity, NICM, PAF with hematuria and anemia who is now s/p Watchman and is being seen for 6 month follow up.    Mr. Daggett has been followed for his atrial fibrillation with the [facility] and was seen 01/2022 at which time he reported a several week hx of hematuria. Hb went from 12.7 in 12/2021 to 9.2. He underwent bladder cauterization with symptom improvement. Given trouble with hematuria, he was referred to Dr. Lalla Brothers for consideration of LAAO closure with Watchman in hopes to avoid long term anticoagulation.   He was seen by Dr. Lalla Brothers 03/20/2022. CT imaging showed anatomy suitable for device implant and no thrombus and is now s/p LAAO closure with Watchman FLX 24mm device. He was restarted on Xarelto with plans to transition to Plavix after 45 days for a total of 6 months duration.    He is here today alone and reports that he has been doing well. Happy to be stopping Plavix given some intermittent bruising. He denies chest pain, palpitations, LE edema, orthopnea, SOB, dizziness, syncope, or bleeding in stool or urine.    Past Medical History:  Diagnosis Date   Benign localized prostatic hyperplasia with lower urinary tract symptoms (LUTS)    urologist-- dr Retta Diones   Cancer [facility])    prostate   Chronic anticoagulation    Xarelto for afib   Chronic constipation     Chronic cystitis    Gross hematuria    History of DVT of lower extremity 06/08/2010   right femoral dvt dx post op total hip 05-08-2010   History of gastric ulcer    remote   History of kidney stones    one stone. remote hx   Hypercholesteremia    Hyperlipidemia    Hypertension    Dx by Dr. Champ Mungo around age  32    Macular degeneration    dry   NICM (nonischemic cardiomyopathy) (HCC)    per echo 06-11-2017,  ef 45-50% with diffuse hypokinesis,  G1DD   OSA on CPAP 2017   compliant with CPAP.  managed by Dr Vassie Loll.   Osteoarthritis    Other urethral stricture, male, meatal    chronic;  pt self dilates   Paroxysmal atrial fibrillation [facility]) cardiologist-- dr Johney Frame   first dx 2009/   a. documented by Dr Otho Perl on office EKG 9/12. b. maintained on Tikosyn and Xarelto. c. s/p DCCV 's.  d.  x3  EP w/ ablation atrial fib last one 12-05-2016   Prediabetes    Premature ventricular contraction    Presence of Watchman left atrial appendage closure  device 05/30/2022   Watchman FLX 24mm by Dr. Lalla Brothers   PVC's (premature ventricular contractions)    RA (rheumatoid arthritis) [facility])    rheumatologist--- Dr Mervyn SkeetersNickola Major,  treated with Humira   RBBB (right bundle branch block)    Hx of a fib, ablation Aug 2018   Rheumatoid arthritis (HCC)    Vitamin D deficiency    Wears hearing aid in both ears     Past Surgical History:  Procedure Laterality Date   ABLATION OF DYSRHYTHMIC FOCUS  08/29/2015   ATRIAL FIBRILLATION ABLATION  12/05/2016   ATRIAL FIBRILLATION ABLATION N/A 12/05/2016   Procedure: Atrial Fibrillation Ablation;  Surgeon: Hillis Range, MD;  Location: [facility] INVASIVE CV LAB;  Service: Cardiovascular;  Laterality: N/A;   ATRIAL FIBRILLATION ABLATION N/A 02/29/2020   Procedure: ATRIAL FIBRILLATION ABLATION;  Surgeon: Hillis Range, MD;  Location: [facility] INVASIVE CV LAB;  Service: Cardiovascular;  Laterality: N/A;   ATRIAL FLUTTER ABLATION  09/2010   by Karren Burly Right 1990s    CARDIOVERSION N/A 10/28/2012   Procedure: CARDIOVERSION;  Surgeon: Kathleene Hazel, MD;  Location: [facility] OR;  Service: Cardiovascular;  Laterality: N/A;   CATARACT EXTRACTION W/ INTRAOCULAR LENS  IMPLANT, BILATERAL  2019   CYSTOSCOPY WITH BIOPSY N/A 09/03/2018   Procedure: CYSTOSCOPY WITH FULGURATION Coralee Pesa;  Surgeon: Marcine Matar, MD;  Location: [facility] ORS;  Service: Urology;  Laterality: N/A;  30 MINS   CYSTOSCOPY WITH FULGERATION N/A 01/23/2022   Procedure: CYSTOSCOPY WITH FULGERATION OF PROSTATIC URETHRA;  Surgeon: Marcine Matar, MD;  Location: [facility] ORS;  Service: Urology;  Laterality: N/A;  30 MINS   DENTAL SURGERY     ELECTROPHYSIOLOGIC STUDY N/A 08/29/2015   Procedure: Atrial Fibrillation Ablation;  Surgeon: Hillis Range, MD;  Location: [facility] INVASIVE CV LAB;  Service: Cardiovascular;  Laterality: N/A;   KNEE SURGERY Bilateral x3   1960s & 1970s   open   LEFT ATRIAL APPENDAGE OCCLUSION N/A 05/30/2022   Procedure: LEFT ATRIAL APPENDAGE OCCLUSION;  Surgeon: Lanier Prude, MD;  Location: [facility]VASIVE CV LAB;  Service: Cardiovascular;  Laterality: N/A;   LEFT HEART CATHETERIZATION WITH CORONARY ANGIOGRAM N/A 06/07/2011   Procedure: LEFT HEART CATHETERIZATION WITH CORONARY ANGIOGRAM;  Surgeon: Peter M Swaziland, MD;  Location: [facility] CATH LAB;  Service: Cardiovascular;  Laterality: N/A;    Normal coronary arteries,  low normal LVF   TEE WITHOUT CARDIOVERSION N/A 05/30/2022   Procedure: TRANSESOPHAGEAL ECHOCARDIOGRAM (TEE);  Surgeon: Lanier Prude, MD;  Location: [facility] INVASIVE CV LAB;  Service: Cardiovascular;  Laterality: N/A;   TONSILLECTOMY AND ADENOIDECTOMY  age 33   TOTAL HIP ARTHROPLASTY Right 06/04/10    dr Thurston Hole  @[facility]    TOTAL KNEE ARTHROPLASTY Bilateral right 1995;  left 1997   TRANSURETHRAL RESECTION OF PROSTATE  06-29-2002   dr humphries  @[facility]    TRANSURETHRAL RESECTION OF PROSTATE N/A 07/06/2018   Procedure: TRANSURETHRAL RESECTION OF THE PROSTATE (TURP);  Surgeon: Marcine Matar, MD;  [facility];  Service: Urology;  Laterality: N/A;  45 MINS    Current Medications: Current Meds  Medication Sig   acetaminophen (TYLENOL) 500 MG tablet Take 1,000 mg by mouth 2 (two) times daily as needed for moderate pain.   aspirin EC 81 MG tablet Take 1 tablet (81 mg total) by mouth daily. Swallow whole.   atorvastatin (LIPITOR) 10 MG tablet Take 1 tablet (10 mg total) by mouth daily.   Carboxymethylcellulose Sodium (THERATEARS OP) Apply 1 drop to eye daily as needed (  dry eyes).   cephALEXin (KEFLEX) 500 MG capsule Take 2,000 mg by mouth as directed. Dental appointments   Cholecalciferol (VITAMIN D3) 5000 UNITS TABS Take 5,000 Units by mouth every morning.   Cranberry 1000 MG CAPS Take 4,000 mg by mouth 2 (two) times daily.   finasteride (PROSCAR) 5 MG tablet Take 5 mg by mouth every morning.    HUMIRA PEN 40 MG/0.4ML PNKT Inject 40 mg as directed every 7 (seven) days. Every Tuesday   lisinopril (ZESTRIL) 40 MG tablet TAKE 1 TABLET BY MOUTH EVERY DAY   Melatonin 10 MG CHEW Chew 3 Pieces of gum by mouth at bedtime. gummy   metoprolol succinate (TOPROL-XL) 50 MG 24 hr tablet TAKE 1/2 TABLET IN THE MORNING AND TAKE 1 TABLET IN THE EVENING   Multiple Vitamins-Minerals (PRESERVISION AREDS 2) CAPS Take 1 capsule by mouth 2 (two) times a day.   pantoprazole (PROTONIX) 40 MG tablet Take 1 tablet (40 mg total) by mouth daily.   senna (SENOKOT) 8.6 MG TABS tablet Take 2 tablets by mouth at bedtime. Per patient taking 2 tablets daily   solifenacin (VESICARE) 10 MG tablet Take 10 mg by mouth at bedtime.    tamsulosin (FLOMAX) 0.4 MG CAPS capsule Take 1 capsule (0.4 mg total) by mouth daily after supper.   Testosterone 20.25 MG/ACT (1.62%) GEL Apply 1 Pump topically at bedtime. Apply to shoulders   vitamin B-12 (CYANOCOBALAMIN) 500 MCG tablet Take 500 mcg by mouth every evening.    [DISCONTINUED] clopidogrel (PLAVIX) 75 MG tablet Take 1 tablet (75 mg total) by  mouth daily. Start on 3/11 and stop Xarelto on 3/10.     Allergies:   Penicillins   Social History   Socioeconomic History   Marital status: Married    Spouse name: Claris Che   Number of children: Not on file   Years of education: Not on file   Highest education level: Bachelor's degree (e.g., BA, AB, BS)  Occupational History   Occupation: Retired  Tobacco Use   Smoking status: Former    Current packs/day: 0.00    Average packs/day: 1 pack/day for 30.0 years (30.0 ttl pk-yrs)    Types: Cigarettes    Start date: 05/06/1948    Quit date: 05/06/1978    Years since quitting: 44.6   Smokeless tobacco: Never   Tobacco comments:    Former smoker 03/14/2021  Vaping Use   Vaping status: Never Used  Substance and Sexual Activity   Alcohol use: Yes    Alcohol/week: 1.0 standard drink of alcohol    Types: 1 Cans of beer per week    Comment: occasionally wine and beer   Drug use: Never   Sexual activity: Not on file  Other Topics Concern   Not on file  Social History Narrative   he patient lives in [place] with his spouse.  Retired.   Social Determinants of Health   Financial Resource Strain: Low Risk  (10/09/2022)   Overall Financial Resource Strain (CARDIA)    Difficulty of Paying Living Expenses: Not hard at all  Food Insecurity: No Food Insecurity (10/09/2022)   Hunger Vital Sign    Worried About Running Out of Food in the Last Year: Never true    Ran Out of Food in the Last Year: Never true  Transportation Needs: No Transportation Needs (10/09/2022)   PRAPARE - Administrator, Civil Service (Medical): No    Lack of Transportation (Non-Medical): No  Physical Activity: Insufficiently Active (10/09/2022)  Exercise Vital Sign    Days of Exercise per Week: 1 day    Minutes of Exercise per Session: 10 min  Stress: No Stress Concern Present (10/09/2022)   [employer] of Occupational Health - Occupational Stress Questionnaire    Feeling of Stress : Not at all   Social Connections: Socially Integrated (10/09/2022)   Social Connection and Isolation Panel [NHANES]    Frequency of Communication with Friends and Family: More than three times a week    Frequency of Social Gatherings with Friends and Family: Twice a week    Attends Religious Services: More than 4 times per year    Active Member of [employer] or Organizations: Yes    Attends Engineer, structural: More than 4 times per year    Marital Status: Married    Family History: The patient's family history includes Arrhythmia in his sister; Atrial fibrillation in his brother; Heart attack in his father; Lung disease in his mother. There is no history of Diabetes.  ROS:   Please see the history of present illness.    All other systems reviewed and are negative.  EKGs/Labs/Other Studies Reviewed:    The following studies were reviewed today:  Cardiac Studies & Procedures       ECHOCARDIOGRAM  ECHOCARDIOGRAM COMPLETE 04/10/2022  Narrative ECHOCARDIOGRAM REPORT    Patient Name:   AHADU SKAHAN [name] Date of Exam: 04/10/2022 Medical Rec #:  [id number]          Height:       72.0 in Accession #:    [id number]         Weight:       219.2 lb Date of Birth:  06/12/1933         BSA:          2.215 m Patient Age:    88 years           BP:           110/62 mmHg Patient Gender: M                  HR:           59 bpm. Exam Location:  [address]  Procedure: 2D Echo, Cardiac Doppler and Color Doppler  Indications:    I48.91 Atrial Fibrillation  History:        Patient has prior history of Echocardiogram examinations, most recent 02/25/2020. Abnormal ECG, Arrythmias:Atrial Fibrillation, Atrial Flutter, PVC and RBBB, Signs/Symptoms:Fatigue; Risk Factors:Family History of Coronary Artery Disease, Hypertension, Dyslipidemia, Former Smoker and Sleep Apnea. Pre-Operative Eval for Watchman, Non-Ischemic Cardiomyopathy (50-55%).  Sonographer:    Farrel Conners RDCS Referring Phys: Lanier Prude  IMPRESSIONS   1. Left ventricular ejection fraction, by estimation, is 50 to 55%. The left ventricle has low normal function. The left ventricle has no regional wall motion abnormalities. Left ventricular diastolic parameters are consistent with Grade II diastolic dysfunction (pseudonormalization). 2. Right ventricular systolic function is normal. The right ventricular size is moderately enlarged. There is normal pulmonary artery systolic pressure. The estimated right ventricular systolic pressure is 35.9 mmHg. 3. Left atrial size was moderately dilated. 4. Right atrial size was mild to moderately dilated. 5. The mitral valve is grossly normal. Mild mitral valve regurgitation. No evidence of mitral stenosis. 6. The aortic valve is tricuspid. Aortic valve regurgitation is mild. No aortic stenosis is present. 7. Aortic dilatation noted. There is mild dilatation of the aortic root, measuring 41 mm. 8. The  inferior vena cava is normal in size with greater than 50% respiratory variability, suggesting right atrial pressure of 3 mmHg.  Comparison(s): No significant change from prior study.  FINDINGS Left Ventricle: Left ventricular ejection fraction, by estimation, is 50 to 55%. The left ventricle has low normal function. The left ventricle has no regional wall motion abnormalities. The left ventricular internal cavity size was normal in size. There is no left ventricular hypertrophy. Left ventricular diastolic parameters are consistent with Grade II diastolic dysfunction (pseudonormalization).  Right Ventricle: The right ventricular size is moderately enlarged. No increase in right ventricular wall thickness. Right ventricular systolic function is normal. There is normal pulmonary artery systolic pressure. The tricuspid regurgitant velocity is 2.87 m/s, and with an assumed right atrial pressure of 3 mmHg, the estimated right ventricular systolic pressure is 35.9 mmHg.  Left Atrium: Left  atrial size was moderately dilated.  Right Atrium: Right atrial size was mild to moderately dilated.  Pericardium: There is no evidence of pericardial effusion.  Mitral Valve: The mitral valve is grossly normal. Mild mitral valve regurgitation. No evidence of mitral valve stenosis.  Tricuspid Valve: The tricuspid valve is grossly normal. Tricuspid valve regurgitation is mild . No evidence of tricuspid stenosis.  Aortic Valve: The aortic valve is tricuspid. Aortic valve regurgitation is mild. Aortic regurgitation PHT measures 725 msec. No aortic stenosis is present.  Pulmonic Valve: The pulmonic valve was grossly normal. Pulmonic valve regurgitation is trivial. No evidence of pulmonic stenosis.  Aorta: Aortic dilatation noted. There is mild dilatation of the aortic root, measuring 41 mm.  Venous: The inferior vena cava is normal in size with greater than 50% respiratory variability, suggesting right atrial pressure of 3 mmHg.  IAS/Shunts: The atrial septum is grossly normal.   LEFT VENTRICLE PLAX 2D LVIDd:         5.95 cm   Diastology LVIDs:         4.25 cm   LV e' medial:    8.92 cm/s LV PW:         0.80 cm   LV E/e' medial:  9.8 LV IVS:        0.80 cm   LV e' lateral:   10.70 cm/s LVOT diam:     2.70 cm   LV E/e' lateral: 8.2 LV SV:         108 LV SV Index:   49 LVOT Area:     5.73 cm   RIGHT VENTRICLE RV Basal diam:  5.40 cm RV Mid diam:    4.75 cm RV S prime:     14.00 cm/s TAPSE (M-mode): 2.8 cm  LEFT ATRIUM             Index        RIGHT ATRIUM           Index LA diam:        4.45 cm 2.01 cm/m   RA Area:     30.10 cm LA Vol (A2C):   95.6 ml 43.16 ml/m  RA Volume:   112.00 ml 50.56 ml/m LA Vol (A4C):   91.2 ml 41.17 ml/m LA Biplane Vol: 99.4 ml 44.87 ml/m AORTIC VALVE LVOT Vmax:   86.10 cm/s LVOT Vmean:  58.450 cm/s LVOT VTI:    0.190 m AI PHT:      725 msec  AORTA Ao Root diam: 4.10 cm Ao Asc diam:  3.80 cm  MITRAL VALVE  TRICUSPID  VALVE MV Area (PHT): cm         TR Peak grad:   32.9 mmHg MV Decel Time: 224 msec    TR Vmax:        287.00 cm/s MV E velocity: 87.55 cm/s MV A velocity: 69.60 cm/s  SHUNTS MV E/A ratio:  1.26        Systemic VTI:  0.19 m Systemic Diam: 2.70 cm  Lennie Odor MD Electronically signed by Lennie Odor MD Signature Date/Time: 04/10/2022/12:45:58 PM    Final   TEE  ECHO TEE 05/30/2022  Narrative TRANSESOPHOGEAL ECHO REPORT    Patient Name:   KARY FRONTINO [name] Date of Exam: 05/30/2022 Medical Rec #:  [id number]          Height:       72.0 in Accession #:    [id number]         Weight:       215.0 lb Date of Birth:  Oct 20, 1933         BSA:          2.197 m Patient Age:    88 years           BP:           130/70 mmHg Patient Gender: M                  HR:           65 bpm. Exam Location:  Inpatient  Procedure: Transesophageal Echo, Color Doppler and 3D Echo  Indications:    Paroxysmal atrial fibrillation (HCC) [I48.0]  History:        Patient has prior history of Echocardiogram examinations, most recent 04/10/2022. Cardiomyopathy, Arrythmias:RBBB, PVC and Atrial Fibrillation; Risk Factors:Hypertension and Dyslipidemia.  Sonographer:    Leta Jungling RDCS Referring Phys: [id number] Lanier Prude  PROCEDURE: After discussion of the risks and benefits of a TEE, an informed consent was obtained from the patient. The patient was intubated. TEE procedure time was 52 minutes. The transesophogeal probe was passed without difficulty through the esophogus of the patient. Imaged were obtained with the patient in a supine position. Sedation performed by different physician. The patient was monitored while under deep sedation. Anesthestetic sedation was provided intravenously by Anesthesiology: 110mg  of Propofol, 60mg  of Lidocaine. Image quality was good. The patient developed no complications during the procedure.  TEE, including live and postprocessed 3D imaging was used to guide the  procedure and assess complications and the final result.  IMPRESSIONS   1. Left ventricular ejection fraction, by estimation, is 50 to 55%. The left ventricle has low normal function. 2. Right ventricular systolic function is normal. The right ventricular size is moderately enlarged. 3. Left atrial size was moderately dilated. No left atrial/left atrial appendage thrombus was detected. 4. Right atrial size was mild to moderately dilated. 5. A small pericardial effusion is present. The pericardial effusion is localized near the right atrium and anterior to the right ventricle. 6. The mitral valve is myxomatous. Mild mitral valve regurgitation. No evidence of mitral stenosis. There is mild holosystolic prolapse of both leaflets of the mitral valve. 7. The tricuspid valve is myxomatous. 8. The aortic valve is tricuspid. Aortic valve regurgitation is trivial. No aortic stenosis is present. 9. Evidence of atrial level shunting detected by color flow Doppler. There is a small patent foramen ovale with predominantly left to right shunting across the atrial septum.  Comparison(s): Interventional TEE for LAAO procedure. Prior to  procedure, there was no evidence of LAA thrombus. Maximal left atrial apendage dimension was 22mm, with sufficient depth for a 24 mm Watchman FLX device. Transeptal puncture performed without complication. Placement of the device was optimized with no peri-device leak. Post procedure there was a tiny left to right shunt through a tiny iatrogenic atrial septal defect. No change in pericardial effusion size before and after case.  FINDINGS Left Ventricle: Left ventricular ejection fraction, by estimation, is 50 to 55%. The left ventricle has low normal function. The left ventricular internal cavity size was normal in size.  Right Ventricle: The right ventricular size is moderately enlarged. No increase in right ventricular wall thickness. Right ventricular systolic function is  normal.  Left Atrium: Left atrial size was moderately dilated. No left atrial/left atrial appendage thrombus was detected.  Right Atrium: Right atrial size was mild to moderately dilated.  Pericardium: A small pericardial effusion is present. The pericardial effusion is localized near the right atrium and anterior to the right ventricle.  Mitral Valve: The mitral valve is myxomatous. There is mild holosystolic prolapse of both leaflets of the mitral valve. Mild mitral valve regurgitation, with centrally-directed jet. No evidence of mitral valve stenosis.  Tricuspid Valve: The tricuspid valve is myxomatous. Tricuspid valve regurgitation is mild . No evidence of tricuspid stenosis. There is mild prolapse of the tricuspid.  Aortic Valve: The aortic valve is tricuspid. Aortic valve regurgitation is trivial. No aortic stenosis is present.  Pulmonic Valve: The pulmonic valve was grossly normal. Pulmonic valve regurgitation is mild. No evidence of pulmonic stenosis.  Aorta: The aortic root and ascending aorta are structurally normal, with no evidence of dilitation.  IAS/Shunts: Evidence of atrial level shunting detected by color flow Doppler. A small patent foramen ovale is detected with predominantly left to right shunting across the atrial septum.  Thurmon Fair MD Electronically signed by Thurmon Fair MD Signature Date/Time: 05/30/2022/11:13:08 AM    Final    CT SCANS  CT CORONARY MORPH W/CTA COR W/SCORE 08/21/2015  Addendum 08/21/2015  1:56 PM ADDENDUM REPORT: 08/21/2015 13:54 CLINICAL DATA:  Pre Afib Ablation EXAM: Cardiac CTA MEDICATIONS: None TECHNIQUE: The patient was scanned on a Philips 256 slice scanner. Gantry rotation speed was 270 msecs. Collimation was .9mm. A 100 kV prospective scan was triggered in the descending thoracic aorta at 111 HU's with 5% padding centered around 78% of the R-R interval. Average HR during the scan was 68 bpm. The 3D data set  was interpreted on a dedicated work station using MPR, MIP and VRT modes. A total of 80cc of contrast was used. FINDINGS: There was mild biatrial enlargement. The was a likely PFO. There was no ASD. The RV appeared mildly dilated. The pulmonary veins drained into the LA normally with no anomalies. The LAA was Small and without thrombus. The esophagus coursed closest to the left inferior pulmonary vein. Measurements for the pulmonary veins were as follows LUPV:  Ostium 16.2 mm Area 2.34 cm2 LLPV:  Ostium 15.6 mm Area 2.2 cm2 RUPV:  22.4 mm Area 3.0 cm2 RLPV: 20.9 mm Area 3.0 cm2 there was a large superior branch to this vessel mimicking a RMPV IMPRESSION: 1) Normal pulmonary vein anatomy with no anomalies 2) Possible PFO 3) Small LAA with no thrombus 4) Esophagus courses closest to LLPV 5) Mild biatrial enlargement 6) Mild RV enlargement Charlton Haws Electronically Signed By: Charlton Haws M.D. On: 08/21/2015 13:54  Narrative EXAM: OVER-READ INTERPRETATION  CT CHEST  The following report is an  over-read performed by radiologist Dr. Royal Piedra [facility] on 08/21/2015. This over-read does not include interpretation of cardiac or coronary anatomy or pathology. The coronary calcium score/coronary CTA interpretation by the cardiologist is attached.  COMPARISON:  Chest CT 06/12/2010.  FINDINGS: Small cluster of peribronchovascular micronodules are noted in the anterior aspect of the right lower lobe, most likely to represent areas of mucoid impaction. Within the visualized portions of the thorax there are no other suspicious appearing pulmonary nodules or masses, there is no acute consolidative airspace disease, no pleural effusions, no pneumothorax and no lymphadenopathy. Visualized portions of the upper abdomen are unremarkable. Schmorl's nodes in several thoracic vertebral bodies incidentally noted. There are no aggressive appearing lytic or blastic  lesions noted in the visualized portions of the skeleton.  IMPRESSION: 1. Small cluster of benign appearing peribronchovascular micronodules in the right lower lobe are most compatible with areas of mucoid impaction within terminal bronchioles no imaging follow-up is recommended. This recommendation follows the consensus statement: Guidelines for Management of Incidental Pulmonary Nodules Detected on CT Images:From the Fleischner Society 2017; published online before print (10.1148/radiol.9528413244). 2. No other significant incidental cardiac finding noted.  Electronically Signed: By: Trudie Reed M.D. On: 08/21/2015 13:15          EKG:  EKG is not ordered today.    Recent Labs: 03/18/2022: ALT 14; TSH 1.30 07/01/2022: BUN 19; Creatinine, Ser 0.83; Potassium 4.3; Sodium 146 09/24/2022: Hemoglobin 13.0; Platelets 124   Recent Lipid Panel    Component Value Date/Time   CHOL 132 07/30/2022 0958   TRIG 83.0 07/30/2022 0958   HDL 54.50 07/30/2022 0958   CHOLHDL 2 07/30/2022 0958   VLDL 16.6 07/30/2022 0958   LDLCALC 61 07/30/2022 0958   Physical Exam:    VS:  BP 122/70   Pulse 62   Ht 6' (1.829 m)   Wt 222 lb (100.7 kg)   SpO2 96%   BMI 30.11 kg/m     Wt Readings from Last 3 Encounters:  12/02/22 222 lb (100.7 kg)  10/16/22 218 lb 6.4 oz (99.1 kg)  07/24/22 219 lb 12.8 oz (99.7 kg)    General: Well developed, well nourished, NAD Lungs:Clear to ausculation bilaterally. No wheezes, rales, or rhonchi. Breathing is unlabored. Cardiovascular: RRR with S1 S2. No murmurs Extremities: No edema.  Neuro: Alert and oriented. No focal deficits. No facial asymmetry. MAE spontaneously. Psych: Responds to questions appropriately with normal affect.    ASSESSMENT/PLAN:    Paroxysmal atrial fibrillation: s/p LAAO closure with Watchman FLX 24mm device and was transitioned off Xarelto to Plavix 75mg  QD on 07/15/22. He is now 6 months out from implant therefore he may stop  Plavix. We discussed adding ASA 81mg  daily to his regimen given family hx of CAD and risk factors with HTN and HLD. Will start ASA 81mg  daily indefinitely for CV risk reduction. No longer requires dental SBE although he continues to take with a hx of knee replacement although not indicated. Plan to touch base with him at 1 year and keep 01/2023 follow up with Dr. Jens Som.     Obstructive sleep apnea: Patient reports compliance with CPAP therapy.   NICM: w/ normalization to 50-55% on last echocardiogram. Appears euvolemic on exam today. No changes at this time.   HTN: Stable today with no changes needed at this time  Medication Adjustments/Labs and Tests Ordered: Current medicines are reviewed at length with the patient today.  Concerns regarding medicines are outlined above.  No  orders of the defined types were placed in this encounter.  Meds ordered this encounter  Medications   aspirin EC 81 MG tablet    Sig: Take 1 tablet (81 mg total) by mouth daily. Swallow whole.    Patient Instructions  Medication Instructions:  Your physician has recommended you make the following change in your medication:  STOP PLAVIX   START ASPIRIN 81 MG DAILY  *If you need a refill on your cardiac medications before your next appointment, please call your pharmacy*   Lab Work: NONE If you have labs (blood work) drawn today and your tests are completely normal, you will receive your results only by: MyChart Message (if you have MyChart) OR A paper copy in the mail If you have any lab test that is abnormal or we need to change your treatment, we will call you to review the results.   Testing/Procedures: NONE   Follow-Up: At [facility], you and your health needs are our priority.  As part of our continuing mission to provide you with exceptional heart care, we have created designated Provider Care Teams.  These Care Teams include your primary Cardiologist (physician) and Advanced Practice  Providers (APPs -  Physician Assistants and Nurse Practitioners) who all work together to provide you with the care you need, when you need it.  We recommend signing up for the patient portal called "MyChart".  Sign up information is provided on this After Visit Summary.  MyChart is used to connect with patients for Virtual Visits (Telemedicine).  Patients are able to view lab/test results, encounter notes, upcoming appointments, etc.  Non-urgent messages can be sent to your provider as well.   To learn more about what you can do with MyChart, go to [email or web address].    Your next appointment:   KEEP SCHEDULED FOLLOW-UP   Signed, Georgie Chard, NP  12/02/2022 10:48 AM    [facility]

## 2022-12-04 DIAGNOSIS — E349 Endocrine disorder, unspecified: Secondary | ICD-10-CM | POA: Diagnosis not present

## 2022-12-04 DIAGNOSIS — C61 Malignant neoplasm of prostate: Secondary | ICD-10-CM | POA: Diagnosis not present

## 2022-12-11 DIAGNOSIS — Z8546 Personal history of malignant neoplasm of prostate: Secondary | ICD-10-CM | POA: Diagnosis not present

## 2022-12-11 DIAGNOSIS — N400 Enlarged prostate without lower urinary tract symptoms: Secondary | ICD-10-CM | POA: Diagnosis not present

## 2022-12-11 DIAGNOSIS — E349 Endocrine disorder, unspecified: Secondary | ICD-10-CM | POA: Diagnosis not present

## 2022-12-18 DIAGNOSIS — H353132 Nonexudative age-related macular degeneration, bilateral, intermediate dry stage: Secondary | ICD-10-CM | POA: Diagnosis not present

## 2022-12-23 ENCOUNTER — Other Ambulatory Visit: Payer: Self-pay | Admitting: Cardiology

## 2022-12-26 DIAGNOSIS — R195 Other fecal abnormalities: Secondary | ICD-10-CM | POA: Diagnosis not present

## 2023-01-14 NOTE — Progress Notes (Signed)
HPI: Follow-up atrial fibrillation, hypertension, hyperlipidemia. Patient had a normal cardiac catheterization in 2013.  Patient had atrial fibrillation ablation in 2017 and 18.  He again had atrial fibrillation ablation in October 2021 with Dr. Johney Frame. Echocardiogram December 2023 showed normal LV function, grade 2 diastolic dysfunction, moderate right ventricular enlargement, moderate left atrial enlargement, mild to moderate right atrial enlargement, mild mitral regurgitation, mild aortic insufficiency, mildly dilated aortic root.  Had Watchman placed January 2024 (history of hematuria).  Follow-up TEE January 2024 showed normal LV function, moderate right ventricular enlargement, biatrial enlargement, small pericardial effusion, mild mitral regurgitation, trace aortic insufficiency, PFO.  Follow-up CTA March 2024 showed watchman in place with no device leak or thrombus detected, PFO noted.  Since last seen, he has some fatigue but denies dyspnea, chest pain, palpitations or syncope.  Current Outpatient Medications  Medication Sig Dispense Refill   acetaminophen (TYLENOL) 500 MG tablet Take 1,000 mg by mouth 2 (two) times daily as needed for moderate pain.     aspirin EC 81 MG tablet Take 1 tablet (81 mg total) by mouth daily. Swallow whole.     atorvastatin (LIPITOR) 10 MG tablet Take 1 tablet (10 mg total) by mouth daily. 90 tablet 1   Carboxymethylcellulose Sodium (THERATEARS OP) Apply 1 drop to eye daily as needed (dry eyes).     cephALEXin (KEFLEX) 500 MG capsule Take 2,000 mg by mouth as directed. Dental appointments     Cholecalciferol (VITAMIN D3) 5000 UNITS TABS Take 5,000 Units by mouth every morning.     Cranberry 1000 MG CAPS Take 4,000 mg by mouth 2 (two) times daily.     HUMIRA PEN 40 MG/0.4ML PNKT Inject 40 mg as directed every 7 (seven) days. Every Tuesday  6   Iron Combinations (CHROMAGEN) capsule Take 1 capsule by mouth daily.     lisinopril (ZESTRIL) 40 MG tablet TAKE 1  TABLET BY MOUTH EVERY DAY 90 tablet 3   Melatonin 10 MG CHEW Chew 3 Pieces of gum by mouth at bedtime. gummy     metoprolol succinate (TOPROL-XL) 50 MG 24 hr tablet TAKE 1/2 TABLET IN THE MORNING AND TAKE 1 TABLET IN THE EVENING (Patient taking differently: daily. Taking 1 tablet in the morning and 1 tablet in the evening.) 135 tablet 3   Multiple Vitamins-Minerals (PRESERVISION AREDS 2) CAPS Take 1 capsule by mouth 2 (two) times a day.     pantoprazole (PROTONIX) 40 MG tablet TAKE 1 TABLET BY MOUTH EVERY DAY 90 tablet 0   senna (SENOKOT) 8.6 MG TABS tablet Take 2 tablets by mouth at bedtime. Per patient taking 2 tablets daily     solifenacin (VESICARE) 10 MG tablet Take 10 mg by mouth at bedtime.      tamsulosin (FLOMAX) 0.4 MG CAPS capsule Take 1 capsule (0.4 mg total) by mouth daily after supper. 30 capsule 5   Testosterone 20.25 MG/ACT (1.62%) GEL Apply 1 Pump topically at bedtime. Apply to shoulders     vitamin B-12 (CYANOCOBALAMIN) 500 MCG tablet Take 500 mcg by mouth every evening.      finasteride (PROSCAR) 5 MG tablet Take 5 mg by mouth every morning.   3   No current facility-administered medications for this visit.     Past Medical History:  Diagnosis Date   Benign localized prostatic hyperplasia with lower urinary tract symptoms (LUTS)    urologist-- dr Retta Diones   Cancer [facility])    prostate   Chronic anticoagulation  Xarelto for afib   Chronic constipation    Chronic cystitis    Gross hematuria    History of DVT of lower extremity 06/08/2010   right femoral dvt dx post op total hip 05-08-2010   History of gastric ulcer    remote   History of kidney stones    one stone. remote hx   Hypercholesteremia    Hyperlipidemia    Hypertension    Dx by Dr. Champ Mungo around age  8    Macular degeneration    dry   NICM (nonischemic cardiomyopathy) (HCC)    per echo 06-11-2017,  ef 45-50% with diffuse hypokinesis,  G1DD   OSA on CPAP 2017   compliant with CPAP.  managed by  Dr Vassie Loll.   Osteoarthritis    Other urethral stricture, male, meatal    chronic;  pt self dilates   Paroxysmal atrial fibrillation [facility]) cardiologist-- dr Johney Frame   first dx 2009/   a. documented by Dr Otho Perl on office EKG 9/12. b. maintained on Tikosyn and Xarelto. c. s/p DCCV 's.  d.  x3  EP w/ ablation atrial fib last one 12-05-2016   Prediabetes    Premature ventricular contraction    Presence of Watchman left atrial appendage closure device 05/30/2022   Watchman FLX 24mm by Dr. Lalla Brothers   PVC's (premature ventricular contractions)    RA (rheumatoid arthritis) [facility])    rheumatologist--- Dr Fontaine No,  treated with Humira   RBBB (right bundle branch block)    Hx of a fib, ablation Aug 2018   Rheumatoid arthritis (HCC)    Vitamin D deficiency    Wears hearing aid in both ears     Past Surgical History:  Procedure Laterality Date   ABLATION OF DYSRHYTHMIC FOCUS  08/29/2015   ATRIAL FIBRILLATION ABLATION  12/05/2016   ATRIAL FIBRILLATION ABLATION N/A 12/05/2016   Procedure: Atrial Fibrillation Ablation;  Surgeon: Hillis Range, MD;  Location: [facility] INVASIVE CV LAB;  Service: Cardiovascular;  Laterality: N/A;   ATRIAL FIBRILLATION ABLATION N/A 02/29/2020   Procedure: ATRIAL FIBRILLATION ABLATION;  Surgeon: Hillis Range, MD;  Location: [facility] INVASIVE CV LAB;  Service: Cardiovascular;  Laterality: N/A;   ATRIAL FLUTTER ABLATION  09/2010   by Karren Burly Right 1990s   CARDIOVERSION N/A 10/28/2012   Procedure: CARDIOVERSION;  Surgeon: Kathleene Hazel, MD;  Location: [facility] OR;  Service: Cardiovascular;  Laterality: N/A;   CATARACT EXTRACTION W/ INTRAOCULAR LENS  IMPLANT, BILATERAL  2019   CYSTOSCOPY WITH BIOPSY N/A 09/03/2018   Procedure: CYSTOSCOPY WITH FULGURATION Coralee Pesa;  Surgeon: Marcine Matar, MD;  Location: [facility] ORS;  Service: Urology;  Laterality: N/A;  30 MINS   CYSTOSCOPY WITH FULGERATION N/A 01/23/2022   Procedure: CYSTOSCOPY WITH FULGERATION OF PROSTATIC URETHRA;   Surgeon: Marcine Matar, MD;  Location: [facility] ORS;  Service: Urology;  Laterality: N/A;  30 MINS   DENTAL SURGERY     ELECTROPHYSIOLOGIC STUDY N/A 08/29/2015   Procedure: Atrial Fibrillation Ablation;  Surgeon: Hillis Range, MD;  Location[facility] INVASIVE CV LAB;  Service: Cardiovascular;  Laterality: N/A;   KNEE SURGERY Bilateral x3   1960s & 1970s   open   LEFT ATRIAL APPENDAGE OCCLUSION N/A 05/30/2022   Procedure: LEFT ATRIAL APPENDAGE OCCLUSION;  Surgeon: Lanier Prude, MD;  Location: [facility] INVASIVE CV LAB;  Service: Cardiovascular;  Laterality: N/A;   LEFT HEART CATHETERIZATION WITH CORONARY ANGIOGRAM N/A 06/07/2011   Procedure: LEFT HEART CATHETERIZATION WITH CORONARY ANGIOGRAM;  Surgeon: Peter M Swaziland, MD;  Location: [facility] CATH LAB;  Service: Cardiovascular;  Laterality: N/A;    Normal coronary arteries,  low normal LVF   TEE WITHOUT CARDIOVERSION N/A 05/30/2022   Procedure: TRANSESOPHAGEAL ECHOCARDIOGRAM (TEE);  Surgeon: Lanier Prude, MD;  Location: [facility] INVASIVE CV LAB;  Service: Cardiovascular;  Laterality: N/A;   TONSILLECTOMY AND ADENOIDECTOMY  age 89   TOTAL HIP ARTHROPLASTY Right 06/04/10    dr Thurston Hole  @[facility]    TOTAL KNEE ARTHROPLASTY Bilateral right 1995;  left 1997   TRANSURETHRAL RESECTION OF PROSTATE  06-29-2002   dr humphries  @[facility]    TRANSURETHRAL RESECTION OF PROSTATE N/A 07/06/2018   Procedure: TRANSURETHRAL RESECTION OF THE PROSTATE (TURP);  Surgeon: Marcine Matar, MD;  Location: [facility];  Service: Urology;  Laterality: N/A;  69 MINS    Social History   Socioeconomic History   Marital status: Married    Spouse name: Claris Che   Number of children: Not on file   Years of education: Not on file   Highest education level: Bachelor's degree (e.g., BA, AB, BS)  Occupational History   Occupation: Retired  Tobacco Use   Smoking status: Former    Current packs/day: 0.00    Average packs/day: 1 pack/day for 30.0 years (30.0 ttl pk-yrs)    Types:  Cigarettes    Start date: 05/06/1948    Quit date: 05/06/1978    Years since quitting: 44.7   Smokeless tobacco: Never   Tobacco comments:    Former smoker 03/14/2021  Vaping Use   Vaping status: Never Used  Substance and Sexual Activity   Alcohol use: Yes    Alcohol/week: 1.0 standard drink of alcohol    Types: 1 Cans of beer per week    Comment: occasionally wine and beer   Drug use: Never   Sexual activity: Not on file  Other Topics Concern   Not on file  Social History Narrative   he patient lives in [place] with his spouse.  Retired.   Social Determinants of Health   Financial Resource Strain: Low Risk  (10/09/2022)   Overall Financial Resource Strain (CARDIA)    Difficulty of Paying Living Expenses: Not hard at all  Food Insecurity: No Food Insecurity (10/09/2022)   Hunger Vital Sign    Worried About Running Out of Food in the Last Year: Never true    Ran Out of Food in the Last Year: Never true  Transportation Needs: No Transportation Needs (10/09/2022)   PRAPARE - Administrator, Civil Service (Medical): No    Lack of Transportation (Non-Medical): No  Physical Activity: Insufficiently Active (10/09/2022)   Exercise Vital Sign    Days of Exercise per Week: 1 day    Minutes of Exercise per Session: 10 min  Stress: No Stress Concern Present (10/09/2022)   [employer] of Occupational Health - Occupational Stress Questionnaire    Feeling of Stress : Not at all  Social Connections: Socially Integrated (10/09/2022)   Social Connection and Isolation Panel [NHANES]    Frequency of Communication with Friends and Family: More than three times a week    Frequency of Social Gatherings with Friends and Family: Twice a week    Attends Religious Services: More than 4 times per year    Active Member of [employer] or Organizations: Yes    Attends Banker Meetings: More than 4 times per year    Marital Status: Married  Catering manager Violence: Not At Risk  (06/25/2022)   Humiliation, Afraid, Rape,  and Kick questionnaire    Fear of Current or Ex-Partner: No    Emotionally Abused: No    Physically Abused: No    Sexually Abused: No    Family History  Problem Relation Age of Onset   Heart attack Father    Lung disease Mother    Arrhythmia Sister    Atrial fibrillation Brother    Diabetes Neg Hx     ROS: no fevers or chills, productive cough, hemoptysis, dysphasia, odynophagia, melena, hematochezia, dysuria, hematuria, rash, seizure activity, orthopnea, PND, pedal edema, claudication. Remaining systems are negative.  Physical Exam: Well-developed well-nourished in no acute distress.  Skin is warm and dry.  HEENT is normal.  Neck is supple.  Chest is clear to auscultation with normal expansion.  Cardiovascular exam is regular rate and rhythm.  Abdominal exam nontender or distended. No masses palpated. Extremities show no edema. neuro grossly intact   A/P  1 paroxysmal atrial fibrillation-patient is in sinus rhythm today on exam.  As outlined above he is status post 3 prior ablations.  Continue Toprol.  Watchman device is in place due to history of recurrent hematuria.  He will therefore not require anticoagulation long-term.  2 nonischemic cardiomyopathy-LV function normal on most recent echocardiogram.  3 hypertension-blood pressure controlled.  Continue present medications.  4 hyperlipidemia-continue statin.  5 obstructive sleep apnea-continue CPAP.  Olga Millers, MD

## 2023-01-17 DIAGNOSIS — H353132 Nonexudative age-related macular degeneration, bilateral, intermediate dry stage: Secondary | ICD-10-CM | POA: Diagnosis not present

## 2023-01-20 DIAGNOSIS — I739 Peripheral vascular disease, unspecified: Secondary | ICD-10-CM | POA: Diagnosis not present

## 2023-01-20 DIAGNOSIS — B351 Tinea unguium: Secondary | ICD-10-CM | POA: Diagnosis not present

## 2023-01-20 DIAGNOSIS — M21962 Unspecified acquired deformity of left lower leg: Secondary | ICD-10-CM | POA: Diagnosis not present

## 2023-01-20 DIAGNOSIS — M7741 Metatarsalgia, right foot: Secondary | ICD-10-CM | POA: Diagnosis not present

## 2023-01-20 DIAGNOSIS — L603 Nail dystrophy: Secondary | ICD-10-CM | POA: Diagnosis not present

## 2023-01-20 DIAGNOSIS — M21961 Unspecified acquired deformity of right lower leg: Secondary | ICD-10-CM | POA: Diagnosis not present

## 2023-01-20 DIAGNOSIS — M7742 Metatarsalgia, left foot: Secondary | ICD-10-CM | POA: Diagnosis not present

## 2023-01-20 DIAGNOSIS — L84 Corns and callosities: Secondary | ICD-10-CM | POA: Diagnosis not present

## 2023-01-22 ENCOUNTER — Encounter: Payer: Self-pay | Admitting: Cardiology

## 2023-01-22 ENCOUNTER — Ambulatory Visit: Payer: Medicare PPO | Attending: Cardiology | Admitting: Cardiology

## 2023-01-22 VITALS — BP 138/66 | HR 75 | Ht 72.0 in | Wt 217.1 lb

## 2023-01-22 DIAGNOSIS — G4733 Obstructive sleep apnea (adult) (pediatric): Secondary | ICD-10-CM | POA: Diagnosis not present

## 2023-01-22 DIAGNOSIS — I480 Paroxysmal atrial fibrillation: Secondary | ICD-10-CM | POA: Diagnosis not present

## 2023-01-22 DIAGNOSIS — I10 Essential (primary) hypertension: Secondary | ICD-10-CM | POA: Diagnosis not present

## 2023-01-22 DIAGNOSIS — Z95818 Presence of other cardiac implants and grafts: Secondary | ICD-10-CM | POA: Diagnosis not present

## 2023-01-22 NOTE — Patient Instructions (Signed)

## 2023-02-15 NOTE — Progress Notes (Unsigned)
[facility] [address], [address] [place], [place] [address] [phone]  Date:  02/19/2023   Name:  Connor [name]   DOB:  [birth year]   MRN:  [id number]  PCP:  Pearline Cables, MD    Chief Complaint: No chief complaint on file.   History of Present Illness:  Connor [name] is a 89 y.o. very pleasant male patient who presents with the following:  Patient seen today for periodic follow-up Most recent visit with myself was in June History of atrial fibrillation, left ventricle dysfunction, prostate cancer, seronegative rheumatoid arthritis and osteoarthritis, sleep apnea.  Has some difficulty using his CPAP and typically uses it for just an hour or 2 nightly He does IM humira weekly for his RA Can update labs today if he would like  He saw his cardiologist, Dr. Jens Som in September: 1 paroxysmal atrial fibrillation-patient is in sinus rhythm today on exam.  As outlined above he is status post 3 prior ablations.  Continue Toprol.  Watchman device is in place due to history of recurrent hematuria.  He will therefore not require anticoagulation long-term. 2 nonischemic cardiomyopathy-LV function normal on most recent echocardiogram. 3 hypertension-blood pressure controlled.  Continue present medications. 4 hyperlipidemia-continue statin. 5 obstructive sleep apnea-continue CPAP.  Patient Active Problem List   Diagnosis Date Noted   Atrial fibrillation (HCC) 05/30/2022   Presence of Watchman left atrial appendage closure device 05/30/2022   Hematuria 01/04/2022   Gross hematuria 01/04/2022   Secondary hypercoagulable state (HCC) 03/28/2020   Malignant neoplasm of prostate (HCC) 09/11/2018   Enlarged prostate with urinary obstruction 07/06/2018   Gastrocnemius strain, right, initial encounter 12/10/2017   Baker's cyst of knee, right 12/10/2017   Obesity 03/24/2017   Paroxysmal atrial fibrillation (HCC) 12/05/2016    Near syncope 04/20/2016   Leg hematoma, left, initial encounter 04/20/2016   Bleeding    Ecchymosis    Left hamstring muscle strain    Muscle tear    RBBB 08/30/2015   Chronic systolic dysfunction of left ventricle 07/11/2014   OSA (obstructive sleep apnea) 04/07/2013   Multinodular goiter 01/20/2013   Cervical spondylosis without myelopathy 01/18/2013   Left ventricular dysfunction 12/17/2011   Low back pain 09/11/2011   Fatigue 05/22/2011   PVC (premature ventricular contraction) 01/18/2011   Persistent atrial fibrillation (HCC) 01/12/2011   Hyperlipidemia 08/02/2010   Hypertension 08/02/2010   Osteoarthritis 08/02/2010   BPH (benign prostatic hyperplasia) 08/02/2010   GERD (gastroesophageal reflux disease) 08/02/2010   h/o Atrial flutter (HCC) s/p ablation 2012     Past Medical History:  Diagnosis Date   Benign localized prostatic hyperplasia with lower urinary tract symptoms (LUTS)    urologist-- dr Retta Diones   Cancer [facility])    prostate   Chronic anticoagulation    Xarelto for afib   Chronic constipation    Chronic cystitis    Gross hematuria    History of DVT of lower extremity 06/08/2010   right femoral dvt dx post op total hip 05-08-2010   History of gastric ulcer    remote   History of kidney stones    one stone. remote hx   Hypercholesteremia    Hyperlipidemia    Hypertension    Dx by Dr. Champ Mungo around age  24    Macular degeneration    dry   NICM (nonischemic cardiomyopathy) (HCC)    per echo 06-11-2017,  ef 45-50% with diffuse hypokinesis,  G1DD   OSA on CPAP 2017   compliant with CPAP.  managed by Dr Vassie Loll.   Osteoarthritis    Other urethral stricture, male, meatal    chronic;  pt self dilates   Paroxysmal atrial fibrillation [facility]) cardiologist-- dr Johney Frame   first dx 2009/   a. documented by Dr Otho Perl on office EKG 9/12. b. maintained on Tikosyn and Xarelto. c. s/p DCCV 's.  d.  x3  EP w/ ablation atrial fib last one 12-05-2016   Prediabetes     Premature ventricular contraction    Presence of Watchman left atrial appendage closure device 05/30/2022   Watchman FLX 24mm by Dr. Lalla Brothers   PVC's (premature ventricular contractions)    RA (rheumatoid arthritis) [facility])    rheumatologist--- Dr Fontaine No,  treated with Humira   RBBB (right bundle branch block)    Hx of a fib, ablation Aug 2018   Rheumatoid arthritis (HCC)    Vitamin D deficiency    Wears hearing aid in both ears     Past Surgical History:  Procedure Laterality Date   ABLATION OF DYSRHYTHMIC FOCUS  08/29/2015   ATRIAL FIBRILLATION ABLATION  12/05/2016   ATRIAL FIBRILLATION ABLATION N/A 12/05/2016   Procedure: Atrial Fibrillation Ablation;  Surgeon: Hillis Range, MD;  Location: [facility] INVASIVE CV LAB;  Service: Cardiovascular;  Laterality: N/A;   ATRIAL FIBRILLATION ABLATION N/A 02/29/2020   Procedure: ATRIAL FIBRILLATION ABLATION;  Surgeon: Hillis Range, MD;  Location: [facility] INVASIVE CV LAB;  Service: Cardiovascular;  Laterality: N/A;   ATRIAL FLUTTER ABLATION  09/2010   by Karren Burly Right 1990s   CARDIOVERSION N/A 10/28/2012   Procedure: CARDIOVERSION;  Surgeon: Kathleene Hazel, MD;  Location: [facility] OR;  Service: Cardiovascular;  Laterality: N/A;   CATARACT EXTRACTION W/ INTRAOCULAR LENS  IMPLANT, BILATERAL  2019   CYSTOSCOPY WITH BIOPSY N/A 09/03/2018   Procedure: CYSTOSCOPY WITH FULGURATION Coralee Pesa;  Surgeon: Marcine Matar, MD;  Location: [facility] ORS;  Service: Urology;  Laterality: N/A;  30 MINS   CYSTOSCOPY WITH FULGERATION N/A 01/23/2022   Procedure: CYSTOSCOPY WITH FULGERATION OF PROSTATIC URETHRA;  Surgeon: Marcine Matar, MD;  Location: [facility] ORS;  Service: Urology;  Laterality: N/A;  30 MINS   DENTAL SURGERY     ELECTROPHYSIOLOGIC STUDY N/A 08/29/2015   Procedure: Atrial Fibrillation Ablation;  Surgeon: Hillis Range, MD;  Location: [facility] INVASIVE CV LAB;  Service: Cardiovascular;  Laterality: N/A;   KNEE SURGERY Bilateral x3   1960s & 1970s   open    LEFT ATRIAL APPENDAGE OCCLUSION N/A 05/30/2022   Procedure: LEFT ATRIAL APPENDAGE OCCLUSION;  Surgeon: Lanier Prude, MD;  Location: [facility] INVASIVE CV LAB;  Service: Cardiovascular;  Laterality: N/A;   LEFT HEART CATHETERIZATION WITH CORONARY ANGIOGRAM N/A 06/07/2011   Procedure: LEFT HEART CATHETERIZATION WITH CORONARY ANGIOGRAM;  Surgeon: Peter M Swaziland, MD;  Location: [facility] CATH LAB;  Service: Cardiovascular;  Laterality: N/A;    Normal coronary arteries,  low normal LVF   TEE WITHOUT CARDIOVERSION N/A 05/30/2022   Procedure: TRANSESOPHAGEAL ECHOCARDIOGRAM (TEE);  Surgeon: Lanier Prude, MD;  [facility] INVASIVE CV LAB;  Service: Cardiovascular;  Laterality: N/A;   TONSILLECTOMY AND ADENOIDECTOMY  age 6   TOTAL HIP ARTHROPLASTY Right 06/04/10    dr Thurston Hole  @[facility]    TOTAL KNEE ARTHROPLASTY Bilateral right 1995;  left 1997   TRANSURETHRAL RESECTION OF PROSTATE  06-29-2002   dr humphries  @[facility]    TRANSURETHRAL RESECTION OF PROSTATE N/A 07/06/2018   Procedure: TRANSURETHRAL RESECTION  OF THE PROSTATE (TURP);  Surgeon: Marcine Matar, MD;  [facility];  Service: Urology;  Laterality: N/A;  45 MINS    Social History   Tobacco Use   Smoking status: Former    Current packs/day: 0.00    Average packs/day: 1 pack/day for 30.0 years (30.0 ttl pk-yrs)    Types: Cigarettes    Start date: 05/06/1948    Quit date: 05/06/1978    Years since quitting: 44.8   Smokeless tobacco: Never   Tobacco comments:    Former smoker 03/14/2021  Vaping Use   Vaping status: Never Used  Substance Use Topics   Alcohol use: Yes    Alcohol/week: 1.0 standard drink of alcohol    Types: 1 Cans of beer per week    Comment: occasionally wine and beer   Drug use: Never    Family History  Problem Relation Age of Onset   Heart attack Father    Lung disease Mother    Arrhythmia Sister    Atrial fibrillation Brother    Diabetes Neg Hx     Allergies  Allergen Reactions   Penicillins  Itching, Swelling and Other (See Comments)    Did it involve swelling of the face/tongue/throat, SOB, or low BP? No Did it involve sudden or severe rash/hives, skin peeling, or any reaction on the inside of your mouth or nose? No Did you need to seek medical attention at a hospital or doctor's office? No When did it last happen?      30 + years If all above answers are "NO", may proceed with cephalosporin use.      Medication list has been reviewed and updated.  Current Outpatient Medications on File Prior to Visit  Medication Sig Dispense Refill   acetaminophen (TYLENOL) 500 MG tablet Take 1,000 mg by mouth 2 (two) times daily as needed for moderate pain.     aspirin EC 81 MG tablet Take 1 tablet (81 mg total) by mouth daily. Swallow whole.     atorvastatin (LIPITOR) 10 MG tablet Take 1 tablet (10 mg total) by mouth daily. 90 tablet 1   Carboxymethylcellulose Sodium (THERATEARS OP) Apply 1 drop to eye daily as needed (dry eyes).     cephALEXin (KEFLEX) 500 MG capsule Take 2,000 mg by mouth as directed. Dental appointments     Cholecalciferol (VITAMIN D3) 5000 UNITS TABS Take 5,000 Units by mouth every morning.     Cranberry 1000 MG CAPS Take 4,000 mg by mouth 2 (two) times daily.     finasteride (PROSCAR) 5 MG tablet Take 5 mg by mouth every morning.   3   HUMIRA PEN 40 MG/0.4ML PNKT Inject 40 mg as directed every 7 (seven) days. Every Tuesday  6   Iron Combinations (CHROMAGEN) capsule Take 1 capsule by mouth daily.     lisinopril (ZESTRIL) 40 MG tablet TAKE 1 TABLET BY MOUTH EVERY DAY 90 tablet 3   Melatonin 10 MG CHEW Chew 3 Pieces of gum by mouth at bedtime. gummy     metoprolol succinate (TOPROL-XL) 50 MG 24 hr tablet TAKE 1/2 TABLET IN THE MORNING AND TAKE 1 TABLET IN THE EVENING (Patient taking differently: daily. Taking 1 tablet in the morning and 1 tablet in the evening.) 135 tablet 3   Multiple Vitamins-Minerals (PRESERVISION AREDS 2) CAPS Take 1 capsule by mouth 2 (two) times a  day.     pantoprazole (PROTONIX) 40 MG tablet TAKE 1 TABLET BY MOUTH EVERY DAY 90 tablet  0   senna (SENOKOT) 8.6 MG TABS tablet Take 2 tablets by mouth at bedtime. Per patient taking 2 tablets daily     solifenacin (VESICARE) 10 MG tablet Take 10 mg by mouth at bedtime.      tamsulosin (FLOMAX) 0.4 MG CAPS capsule Take 1 capsule (0.4 mg total) by mouth daily after supper. 30 capsule 5   Testosterone 20.25 MG/ACT (1.62%) GEL Apply 1 Pump topically at bedtime. Apply to shoulders     vitamin B-12 (CYANOCOBALAMIN) 500 MCG tablet Take 500 mcg by mouth every evening.      No current facility-administered medications on file prior to visit.    Review of Systems:  As per HPI- otherwise negative.   Physical Examination: There were no vitals filed for this visit. There were no vitals filed for this visit. There is no height or weight on file to calculate BMI. Ideal Body Weight:    GEN: no acute distress. HEENT: Atraumatic, Normocephalic.  Ears and Nose: No external deformity. CV: RRR, No M/G/R. No JVD. No thrill. No extra heart sounds. PULM: CTA B, no wheezes, crackles, rhonchi. No retractions. No resp. distress. No accessory muscle use. ABD: S, NT, ND, +BS. No rebound. No HSM. EXTR: No c/c/e PSYCH: Normally interactive. Conversant.    Assessment and Plan: ***  Signed Abbe Amsterdam, MD

## 2023-02-16 DIAGNOSIS — H353132 Nonexudative age-related macular degeneration, bilateral, intermediate dry stage: Secondary | ICD-10-CM | POA: Diagnosis not present

## 2023-02-19 ENCOUNTER — Ambulatory Visit: Payer: Medicare PPO | Admitting: Family Medicine

## 2023-02-19 ENCOUNTER — Encounter: Payer: Self-pay | Admitting: Family Medicine

## 2023-02-19 VITALS — BP 112/62 | HR 63 | Temp 98.3°F | Resp 18 | Ht 72.0 in | Wt 225.2 lb

## 2023-02-19 DIAGNOSIS — R5381 Other malaise: Secondary | ICD-10-CM | POA: Diagnosis not present

## 2023-02-19 DIAGNOSIS — I10 Essential (primary) hypertension: Secondary | ICD-10-CM

## 2023-02-19 DIAGNOSIS — E782 Mixed hyperlipidemia: Secondary | ICD-10-CM

## 2023-02-19 DIAGNOSIS — Z636 Dependent relative needing care at home: Secondary | ICD-10-CM | POA: Diagnosis not present

## 2023-02-19 DIAGNOSIS — I480 Paroxysmal atrial fibrillation: Secondary | ICD-10-CM

## 2023-02-19 LAB — CBC
HCT: 42.2 % (ref 39.0–52.0)
Hemoglobin: 13.6 g/dL (ref 13.0–17.0)
MCHC: 32.3 g/dL (ref 30.0–36.0)
MCV: 106.2 fL — ABNORMAL HIGH (ref 78.0–100.0)
Platelets: 129 10*3/uL — ABNORMAL LOW (ref 150.0–400.0)
RBC: 3.97 Mil/uL — ABNORMAL LOW (ref 4.22–5.81)
RDW: 14.5 % (ref 11.5–15.5)
WBC: 5.4 10*3/uL (ref 4.0–10.5)

## 2023-02-19 LAB — COMPREHENSIVE METABOLIC PANEL
ALT: 13 U/L (ref 0–53)
AST: 16 U/L (ref 0–37)
Albumin: 4 g/dL (ref 3.5–5.2)
Alkaline Phosphatase: 69 U/L (ref 39–117)
BUN: 15 mg/dL (ref 6–23)
CO2: 31 meq/L (ref 19–32)
Calcium: 9 mg/dL (ref 8.4–10.5)
Chloride: 103 meq/L (ref 96–112)
Creatinine, Ser: 0.74 mg/dL (ref 0.40–1.50)
GFR: 81.78 mL/min (ref >60.00)
Glucose, Bld: 108 mg/dL — ABNORMAL HIGH (ref 70–99)
Potassium: 4.2 meq/L (ref 3.5–5.1)
Sodium: 142 meq/L (ref 135–145)
Total Bilirubin: 1.1 mg/dL (ref 0.2–1.2)
Total Protein: 6.6 g/dL (ref 6.0–8.3)

## 2023-02-19 NOTE — Patient Instructions (Addendum)
It was good to see you today!   For future needs/ memory concerns; you might consider moving into a community that offers memory care in case this is something you may need later on.    It is impossible to know if or when this care will be necessary- however it is generally better to make this move at your convenience as opposed to after a crisis develops!   I will be in touch with your labs asap

## 2023-02-20 ENCOUNTER — Other Ambulatory Visit: Payer: Self-pay | Admitting: Family Medicine

## 2023-02-20 DIAGNOSIS — R7989 Other specified abnormal findings of blood chemistry: Secondary | ICD-10-CM

## 2023-03-10 ENCOUNTER — Encounter: Payer: Self-pay | Admitting: Family Medicine

## 2023-03-10 ENCOUNTER — Other Ambulatory Visit (INDEPENDENT_AMBULATORY_CARE_PROVIDER_SITE_OTHER): Payer: Medicare PPO

## 2023-03-10 DIAGNOSIS — M79673 Pain in unspecified foot: Secondary | ICD-10-CM

## 2023-03-10 DIAGNOSIS — M21961 Unspecified acquired deformity of right lower leg: Secondary | ICD-10-CM | POA: Diagnosis not present

## 2023-03-10 DIAGNOSIS — M21962 Unspecified acquired deformity of left lower leg: Secondary | ICD-10-CM | POA: Diagnosis not present

## 2023-03-10 DIAGNOSIS — M7742 Metatarsalgia, left foot: Secondary | ICD-10-CM | POA: Diagnosis not present

## 2023-03-10 DIAGNOSIS — M65871 Other synovitis and tenosynovitis, right ankle and foot: Secondary | ICD-10-CM | POA: Diagnosis not present

## 2023-03-10 DIAGNOSIS — L603 Nail dystrophy: Secondary | ICD-10-CM | POA: Diagnosis not present

## 2023-03-10 DIAGNOSIS — L84 Corns and callosities: Secondary | ICD-10-CM | POA: Diagnosis not present

## 2023-03-10 DIAGNOSIS — B351 Tinea unguium: Secondary | ICD-10-CM | POA: Diagnosis not present

## 2023-03-10 DIAGNOSIS — M7741 Metatarsalgia, right foot: Secondary | ICD-10-CM | POA: Diagnosis not present

## 2023-03-10 DIAGNOSIS — I739 Peripheral vascular disease, unspecified: Secondary | ICD-10-CM | POA: Diagnosis not present

## 2023-03-10 NOTE — Progress Notes (Signed)
Pt requests that labs done today be faxed to [phone] @ [facility].  He also brought lab order form with him that requested a CBC w/diff as well as Uric acid.  I added CBCw/diff to today's labs.

## 2023-03-10 NOTE — Addendum Note (Signed)
Addended by: Mervin Kung A on: 03/10/2023 02:45 PM   Modules accepted: Orders

## 2023-03-11 ENCOUNTER — Encounter: Payer: Self-pay | Admitting: Family Medicine

## 2023-03-11 LAB — CBC WITH DIFFERENTIAL/PLATELET
Basophils Absolute: 0 10*3/uL (ref 0.0–0.1)
Basophils Relative: 0.7 % (ref 0.0–3.0)
Eosinophils Absolute: 0.1 10*3/uL (ref 0.0–0.7)
Eosinophils Relative: 2.3 % (ref 0.0–5.0)
HCT: 40.3 % (ref 39.0–52.0)
Hemoglobin: 13.1 g/dL (ref 13.0–17.0)
Lymphocytes Relative: 20.2 % (ref 12.0–46.0)
Lymphs Abs: 1 10*3/uL (ref 0.7–4.0)
MCHC: 32.4 g/dL (ref 30.0–36.0)
MCV: 106 fL — ABNORMAL HIGH (ref 78.0–100.0)
Monocytes Absolute: 0.5 10*3/uL (ref 0.1–1.0)
Monocytes Relative: 10.4 % (ref 3.0–12.0)
Neutro Abs: 3.2 10*3/uL (ref 1.4–7.7)
Neutrophils Relative %: 66.4 % (ref 43.0–77.0)
Platelets: 127 10*3/uL — ABNORMAL LOW (ref 150.0–400.0)
RBC: 3.8 Mil/uL — ABNORMAL LOW (ref 4.22–5.81)
RDW: 14.4 % (ref 11.5–15.5)
WBC: 4.8 10*3/uL (ref 4.0–10.5)

## 2023-03-11 LAB — URIC ACID: Uric Acid, Serum: 3.5 mg/dL — ABNORMAL LOW (ref 4.0–7.8)

## 2023-03-12 ENCOUNTER — Encounter: Payer: Self-pay | Admitting: Family Medicine

## 2023-03-13 ENCOUNTER — Ambulatory Visit: Payer: Medicare PPO

## 2023-03-13 DIAGNOSIS — M21961 Unspecified acquired deformity of right lower leg: Secondary | ICD-10-CM | POA: Diagnosis not present

## 2023-03-13 DIAGNOSIS — M65871 Other synovitis and tenosynovitis, right ankle and foot: Secondary | ICD-10-CM | POA: Diagnosis not present

## 2023-03-13 DIAGNOSIS — R7989 Other specified abnormal findings of blood chemistry: Secondary | ICD-10-CM | POA: Diagnosis not present

## 2023-03-13 DIAGNOSIS — M2011 Hallux valgus (acquired), right foot: Secondary | ICD-10-CM | POA: Diagnosis not present

## 2023-03-13 NOTE — Telephone Encounter (Signed)
Hi Tricia, yes please add on what you can and then have him come back at his convenience for repeat draw.

## 2023-03-13 NOTE — Addendum Note (Signed)
Addended by: Mervin Kung A on: 03/13/2023 02:30 PM   Modules accepted: Orders

## 2023-03-14 LAB — FOLATE: Folate: 11.2 ng/mL

## 2023-03-14 LAB — FERRITIN: Ferritin: 52 ng/mL (ref 24–380)

## 2023-03-14 LAB — VITAMIN B12: Vitamin B-12: 963 pg/mL (ref 200–1100)

## 2023-03-16 ENCOUNTER — Encounter: Payer: Self-pay | Admitting: Family Medicine

## 2023-03-18 DIAGNOSIS — H353132 Nonexudative age-related macular degeneration, bilateral, intermediate dry stage: Secondary | ICD-10-CM | POA: Diagnosis not present

## 2023-03-21 ENCOUNTER — Other Ambulatory Visit: Payer: Self-pay | Admitting: Cardiology

## 2023-03-21 NOTE — Telephone Encounter (Signed)
Pt's pharmacy is requesting a refill on pantoprazole. Would Dr. Jens Som like to refill this medication? Please address

## 2023-03-25 ENCOUNTER — Other Ambulatory Visit (INDEPENDENT_AMBULATORY_CARE_PROVIDER_SITE_OTHER): Payer: Medicare PPO

## 2023-03-25 DIAGNOSIS — R7989 Other specified abnormal findings of blood chemistry: Secondary | ICD-10-CM | POA: Diagnosis not present

## 2023-03-26 LAB — PATHOLOGIST SMEAR REVIEW

## 2023-03-28 ENCOUNTER — Ambulatory Visit: Payer: Medicare PPO | Admitting: Podiatry

## 2023-03-28 ENCOUNTER — Encounter: Payer: Self-pay | Admitting: Family Medicine

## 2023-03-28 ENCOUNTER — Ambulatory Visit (INDEPENDENT_AMBULATORY_CARE_PROVIDER_SITE_OTHER): Payer: Medicare PPO

## 2023-03-28 ENCOUNTER — Encounter: Payer: Self-pay | Admitting: Podiatry

## 2023-03-28 DIAGNOSIS — D7589 Other specified diseases of blood and blood-forming organs: Secondary | ICD-10-CM

## 2023-03-28 DIAGNOSIS — M21619 Bunion of unspecified foot: Secondary | ICD-10-CM | POA: Diagnosis not present

## 2023-03-28 DIAGNOSIS — M778 Other enthesopathies, not elsewhere classified: Secondary | ICD-10-CM

## 2023-03-28 NOTE — Progress Notes (Unsigned)
Subjective:   Patient ID: Connor [name], male   DOB: 89 y.o.   MRN: [id number]   HPI Chief Complaint  Patient presents with   Foot Pain    RM#14 Right foot pain and swelling patient has pictures of his xray.    On Nov 2nd started, it is better now. That day his foot was swollen and it hurt to walk. On that mONday saw Dr. Nelva Nay, x-ray and no diagnosis. Was wearinga  boot around the house. It starated to trun red. No pain now and the swelling is better and only at the end of the day. No history gout.    ROS      Objective:  Physical Exam  ***     Assessment:  ***     Plan:  ***    Needs nail

## 2023-03-31 ENCOUNTER — Encounter: Payer: Self-pay | Admitting: Family Medicine

## 2023-03-31 ENCOUNTER — Ambulatory Visit: Payer: Medicare PPO | Admitting: Family Medicine

## 2023-03-31 VITALS — BP 128/68 | HR 64 | Temp 97.8°F | Ht 72.0 in | Wt 221.0 lb

## 2023-03-31 DIAGNOSIS — J988 Other specified respiratory disorders: Secondary | ICD-10-CM | POA: Diagnosis not present

## 2023-03-31 MED ORDER — DOXYCYCLINE HYCLATE 100 MG PO TABS
100.0000 mg | ORAL_TABLET | Freq: Two times a day (BID) | ORAL | 0 refills | Status: AC
Start: 2023-03-31 — End: 2023-04-07

## 2023-03-31 NOTE — Progress Notes (Signed)
Acute Office Visit  Subjective:     Patient ID: Connor [name], male    DOB: 1934-04-13, 89 y.o.   MRN: [id number]  Chief Complaint  Patient presents with   Sore Throat     Patient is in today for cough, sore throat.   Discussed the use of AI scribe software for clinical note transcription with the patient, who gave verbal consent to proceed.  History of Present Illness   Patient presents with a week-long history of chest congestion and sore throat, without any associated rhinorrhea. The congestion is described as being located in the chest, with expectoration of yellowish phlegm. The patient denies any difficulty in breathing or chest pain. Over-the-counter remedies, including Dayquil and Nyquil, have been used intermittently with some relief of symptoms.  The patient denies any associated ear pain or pressure, fever, or recent exposure to sick contacts. The onset of symptoms was noted a week ago. The patient denies any difficulty swallowing despite the sore throat.  The patient has a known allergy to penicillin. He is leaving today to go out of town for the week of Thanksgiving.           ROS All review of systems negative except what is listed in the HPI      Objective:    BP 128/68   Pulse 64   Temp 97.8 F (36.6 C) (Oral)   Ht 6' (1.829 m)   Wt 221 lb (100.2 kg)   SpO2 93%   BMI 29.97 kg/m    Physical Exam Vitals reviewed.  Constitutional:      Appearance: Normal appearance.  HENT:     Head: Normocephalic and atraumatic.     Nose: No congestion or rhinorrhea.     Mouth/Throat:     Pharynx: Oropharynx is clear. No oropharyngeal exudate or posterior oropharyngeal erythema.  Cardiovascular:     Rate and Rhythm: Normal rate and regular rhythm.     Heart sounds: Normal heart sounds.  Pulmonary:     Effort: Pulmonary effort is normal.     Breath sounds: Normal breath sounds. No wheezing, rhonchi or rales.  Skin:    General: Skin is warm and dry.   Neurological:     Mental Status: He is alert and oriented to person, place, and time.  Psychiatric:        Mood and Affect: Mood normal.        Behavior: Behavior normal.        Thought Content: Thought content normal.        Judgment: Judgment normal.     No results found for any visits on 03/31/23.      Assessment & Plan:   Problem List Items Addressed This Visit   None Visit Diagnoses     Respiratory infection    -  Primary   Relevant Medications   doxycycline (VIBRA-TABS) 100 MG tablet     Start antibiotics - doxycycline (take with food and water, add a probiotic, mid-day) Continue supportive measures including rest, hydration, humidifier use, steam showers, warm compresses to sinuses, warm liquids with lemon and honey, and over-the-counter cough, cold, and analgesics as needed.  If not improving, please follow-up.  Meds ordered this encounter  Medications   doxycycline (VIBRA-TABS) 100 MG tablet    Sig: Take 1 tablet (100 mg total) by mouth 2 (two) times daily for 7 days.    Dispense:  14 tablet    Refill:  0    Order  Specific Question:   Supervising Provider    Answer:   Danise Edge A [[id number]]    Return if symptoms worsen or fail to improve.  Clayborne Dana, NP

## 2023-03-31 NOTE — Patient Instructions (Signed)
Start antibiotics - doxycycline (take with food and water, add a probiotic, mid-day) Continue supportive measures including rest, hydration, humidifier use, steam showers, warm compresses to sinuses, warm liquids with lemon and honey, and over-the-counter cough, cold, and analgesics as needed.  If not improving, please follow-up.

## 2023-04-01 ENCOUNTER — Encounter: Payer: Self-pay | Admitting: Family Medicine

## 2023-04-15 DIAGNOSIS — E669 Obesity, unspecified: Secondary | ICD-10-CM | POA: Diagnosis not present

## 2023-04-15 DIAGNOSIS — M1991 Primary osteoarthritis, unspecified site: Secondary | ICD-10-CM | POA: Diagnosis not present

## 2023-04-15 DIAGNOSIS — Z79899 Other long term (current) drug therapy: Secondary | ICD-10-CM | POA: Diagnosis not present

## 2023-04-15 DIAGNOSIS — Z6830 Body mass index (BMI) 30.0-30.9, adult: Secondary | ICD-10-CM | POA: Diagnosis not present

## 2023-04-15 DIAGNOSIS — M0600 Rheumatoid arthritis without rheumatoid factor, unspecified site: Secondary | ICD-10-CM | POA: Diagnosis not present

## 2023-04-17 DIAGNOSIS — H353132 Nonexudative age-related macular degeneration, bilateral, intermediate dry stage: Secondary | ICD-10-CM | POA: Diagnosis not present

## 2023-04-23 ENCOUNTER — Ambulatory Visit: Payer: Medicare PPO | Admitting: Family Medicine

## 2023-04-23 VITALS — BP 130/72 | HR 81 | Temp 97.6°F | Resp 18 | Ht 72.0 in | Wt 224.0 lb

## 2023-04-23 DIAGNOSIS — R58 Hemorrhage, not elsewhere classified: Secondary | ICD-10-CM | POA: Diagnosis not present

## 2023-04-23 NOTE — Progress Notes (Signed)
[facility] [address] [place], [place] [address] [phone]  Date:  04/23/2023   Name:  Connor [name]   DOB:  [birth year]   MRN:  [id number]  PCP:  Pearline Cables, MD    Chief Complaint: bruise   History of Present Illness:  Connor [name] is a 89 y.o. very pleasant male patient who presents with the following:  Patient seen today with concern of a bruise on his leg-left posterior calf Most recent visit with myself was in October History of atrial fibrillation, left ventricle dysfunction, prostate cancer, seronegative rheumatoid arthritis and osteoarthritis, sleep apnea.  Has some difficulty using his CPAP and typically uses it for just an hour or 2 nightly He does IM humira weekly for his RA  He is taking aspirin, no other blood thinners at this time-he received a Watchman device due to history of hematuria and is no longer on anticoagulation  He notes that 3 days ago he nearly fell and hit his left calf on a coffee table - it left a bruise on his posterior calf and this worried him He notes history of "hematoma" in his family, he states his father died of a hematoma in his 56s He did not have loss of consciousness,-not quite sure how he fell, but believes he tripped in some way.  The area over the bruise is sore to pressure but does not hurt to walk He otherwise feels well, no shortness of breath or chest pain  Patient Active Problem List   Diagnosis Date Noted   Atrial fibrillation (HCC) 05/30/2022   Presence of Watchman left atrial appendage closure device 05/30/2022   Hematuria 01/04/2022   Gross hematuria 01/04/2022   Secondary hypercoagulable state (HCC) 03/28/2020   Malignant neoplasm of prostate (HCC) 09/11/2018   Enlarged prostate with urinary obstruction 07/06/2018   Gastrocnemius strain, right, initial encounter 12/10/2017   Baker's cyst of knee, right 12/10/2017   Obesity  03/24/2017   Paroxysmal atrial fibrillation (HCC) 12/05/2016   Near syncope 04/20/2016   Leg hematoma, left, initial encounter 04/20/2016   Bleeding    Ecchymosis    Left hamstring muscle strain    Muscle tear    RBBB 08/30/2015   Chronic systolic dysfunction of left ventricle 07/11/2014   OSA (obstructive sleep apnea) 04/07/2013   Multinodular goiter 01/20/2013   Cervical spondylosis without myelopathy 01/18/2013   Left ventricular dysfunction 12/17/2011   Low back pain 09/11/2011   Fatigue 05/22/2011   PVC (premature ventricular contraction) 01/18/2011   Persistent atrial fibrillation (HCC) 01/12/2011   Hyperlipidemia 08/02/2010   Hypertension 08/02/2010   Osteoarthritis 08/02/2010   BPH (benign prostatic hyperplasia) 08/02/2010   GERD (gastroesophageal reflux disease) 08/02/2010   h/o Atrial flutter (HCC) s/p ablation 2012     Past Medical History:  Diagnosis Date   Benign localized prostatic hyperplasia with lower urinary tract symptoms (LUTS)    urologist-- dr Retta Diones   Cancer [facility])    prostate   Chronic anticoagulation    Xarelto for afib   Chronic constipation    Chronic cystitis    Gross hematuria    History of DVT of lower extremity 06/08/2010   right femoral dvt dx post op total hip 05-08-2010   History of gastric ulcer    remote   History of kidney stones    one stone. remote hx   Hypercholesteremia    Hyperlipidemia  Hypertension    Dx by Dr. Champ Mungo around age  54    Macular degeneration    dry   NICM (nonischemic cardiomyopathy) (HCC)    per echo 06-11-2017,  ef 45-50% with diffuse hypokinesis,  G1DD   OSA on CPAP 2017   compliant with CPAP.  managed by Dr Vassie Loll.   Osteoarthritis    Other urethral stricture, male, meatal    chronic;  pt self dilates   Paroxysmal atrial fibrillation [facility]) cardiologist-- dr Johney Frame   first dx 2009/   a. documented by Dr Otho Perl on office EKG 9/12. b. maintained on Tikosyn and Xarelto. c. s/p DCCV 's.  d.  x3  EP  w/ ablation atrial fib last one 12-05-2016   Prediabetes    Premature ventricular contraction    Presence of Watchman left atrial appendage closure device 05/30/2022   Watchman FLX 24mm by Dr. Lalla Brothers   PVC's (premature ventricular contractions)    RA (rheumatoid arthritis) [facility])    rheumatologist--- Dr Fontaine No,  treated with Humira   RBBB (right bundle branch block)    Hx of a fib, ablation Aug 2018   Rheumatoid arthritis (HCC)    Vitamin D deficiency    Wears hearing aid in both ears     Past Surgical History:  Procedure Laterality Date   ABLATION OF DYSRHYTHMIC FOCUS  08/29/2015   ATRIAL FIBRILLATION ABLATION  12/05/2016   ATRIAL FIBRILLATION ABLATION N/A 12/05/2016   Procedure: Atrial Fibrillation Ablation;  Surgeon: Hillis Range, MD;  [facility] INVAS[facility] CV LAB;  Service: Cardiovascular;  Laterality: N/A;   ATRIAL FIBRILLATION ABLATION N/A 02/29/2020   Procedure: ATRIAL FIBRILLATION ABLATION;  Surgeon: Hillis Range, MD;  [facility];  Service: Cardiovascular;  Laterality: N/A;   ATRIAL FLUTTER ABLATION  09/2010   by Karren Burly Right 1990s   CARDIOVERSION N/A 10/28/2012   Procedure: CARDIOVERSION;  Surgeon: Kathleene Hazel, MD;  Location: [facility] OR;  Service: Cardiovascular;  Laterality: N/A;   CATARACT EXTRACTION W/ INTRAOCULAR LENS  IMPLANT, BILATERAL  2019   CYSTOSCOPY WITH BIOPSY N/A 09/03/2018   Procedure: CYSTOSCOPY WITH FULGURATION Connor [name];  Surgeon: Marcine Matar, MD;  Location: [facility] ORS;  Service: Urology;  Laterality: N/A;  30 MINS   CYSTOSCOPY WITH FULGERATION N/A 01/23/2022   Procedure: CYSTOSCOPY WITH FULGERATION OF PROSTATIC URETHRA;  Surgeon: Marcine Matar, MD;  Location: [facility] OR[facility];  Service: Urology;  Laterality: N/A;  30 MINS   DENTAL SURGERY     ELECTROPHYSIOLOGIC STUDY N/A 08/29/2015   Procedure: Atrial Fibrillation Ablation;  Surgeon: Hillis Range, MD;  Location: [facility] INVASIVE CV LAB;  Service: Cardiovascular;   Laterality: N/A;   KNEE SURGERY Bilateral x3   1960s & 1970s   open   LEFT ATRIAL APPENDAGE OCCLUSION N/A 05/30/2022   Procedure: LEFT ATRIAL APPENDAGE OCCLUSION;  Surgeon: Lanier Prude, MD;  [facility];  Service: Cardiovascular;  Laterality: N/A;   LEFT HEART CATHETERIZATION WITH CORONARY ANGIOGRAM N/A 06/07/2011   Procedure: LEFT HEART CATHETERIZATION WITH CORONARY ANGIOGRAM;  Surgeon: Peter M Swaziland, MD;  Location: [facility] CATH LAB;  Service: Cardiovascular;  Laterality: N/A;    Normal coronary arteries,  low normal LVF   TEE WITHOUT CARDIOVERSION N/A 05/30/2022   Procedure: TRANSESOPHAGEAL ECHOCARDIOGRAM (TEE);  Surgeon: Lanier Prude, MD;  Location: [facility] INVASIVE CV LAB;  Service: Cardiovascular;  Laterality: N/A;   TONSILLECTOMY AND ADENOIDECTOMY  age 61   TOTAL HIP ARTHROPLASTY Right 06/04/10    dr  wainer  @[facility]    TOTAL KNEE ARTHROPLASTY Bilateral right 1995;  left 1997   TRANSURETHRAL RESECTION OF PROSTATE  06-29-2002   dr humphries  @[facility]    TRANSURETHRAL RESECTION OF PROSTATE N/A 07/06/2018   Procedure: TRANSURETHRAL RESECTION OF THE PROSTATE (TURP);  Surgeon: Marcine Matar, MD;  Location: [facility];  Service: Urology;  Laterality: N/A;  45 MINS    Social History   Tobacco Use   Smoking status: Former    Current packs/day: 0.00    Average packs/day: 1 pack/day for 30.0 years (30.0 ttl pk-yrs)    Types: Cigarettes    Start date: 05/06/1948    Quit date: 05/06/1978    Years since quitting: 44.9   Smokeless tobacco: Never   Tobacco comments:    Former smoker 03/14/2021  Vaping Use   Vaping status: Never Used  Substance Use Topics   Alcohol use: Yes    Alcohol/week: 1.0 standard drink of alcohol    Types: 1 Cans of beer per week    Comment: occasionally wine and beer   Drug use: Never    Family History  Problem Relation Age of Onset   Heart attack Father    Lung disease Mother    Arrhythmia Sister    Atrial fibrillation Brother     Diabetes Neg Hx     Allergies  Allergen Reactions   Penicillins Itching, Swelling and Other (See Comments)    Did it involve swelling of the face/tongue/throat, SOB, or low BP? No Did it involve sudden or severe rash/hives, skin peeling, or any reaction on the inside of your mouth or nose? No Did you need to seek medical attention at a hospital or doctor's office? No When did it last happen?      30 + years If all above answers are "NO", may proceed with cephalosporin use.      Medication list has been reviewed and updated.  Current Outpatient Medications on File Prior to Visit  Medication Sig Dispense Refill   acetaminophen (TYLENOL) 500 MG tablet Take 1,000 mg by mouth 2 (two) times daily as needed for moderate pain.     aspirin EC 81 MG tablet Take 1 tablet (81 mg total) by mouth daily. Swallow whole.     atorvastatin (LIPITOR) 10 MG tablet Take 1 tablet (10 mg total) by mouth daily. 90 tablet 1   Carboxymethylcellulose Sodium (THERATEARS OP) Apply 1 drop to eye daily as needed (dry eyes).     cephALEXin (KEFLEX) 500 MG capsule Take 2,000 mg by mouth as directed. Dental appointments (Patient not taking: Reported on 03/31/2023)     Cholecalciferol (VITAMIN D3) 5000 UNITS TABS Take 5,000 Units by mouth every morning.     Cranberry 1000 MG CAPS Take 4,000 mg by mouth 2 (two) times daily.     HUMIRA PEN 40 MG/0.4ML PNKT Inject 40 mg as directed every 7 (seven) days. Every Tuesday  6   lisinopril (ZESTRIL) 40 MG tablet TAKE 1 TABLET BY MOUTH EVERY DAY 90 tablet 3   Melatonin 10 MG CHEW Chew 3 Pieces of gum by mouth at bedtime. gummy     metoprolol succinate (TOPROL-XL) 50 MG 24 hr tablet TAKE 1/2 TABLET IN THE MORNING AND TAKE 1 TABLET IN THE EVENING (Patient taking differently: daily. Taking 1 tablet in the morning and 1 tablet in the evening.) 135 tablet 3   Multiple Vitamins-Minerals (PRESERVISION AREDS 2) CAPS Take 1 capsule by mouth 2 (two) times a day.  pantoprazole (PROTONIX)  40 MG tablet TAKE 1 TABLET BY MOUTH EVERY DAY 90 tablet 3   senna (SENOKOT) 8.6 MG TABS tablet Take 2 tablets by mouth at bedtime. Per patient taking 2 tablets daily     solifenacin (VESICARE) 10 MG tablet Take 10 mg by mouth at bedtime.      tamsulosin (FLOMAX) 0.4 MG CAPS capsule Take 1 capsule (0.4 mg total) by mouth daily after supper. 30 capsule 5   Testosterone 20.25 MG/ACT (1.62%) GEL Apply 1 Pump topically at bedtime. Apply to shoulders     vitamin B-12 (CYANOCOBALAMIN) 500 MCG tablet Take 500 mcg by mouth every evening.      No current facility-administered medications on file prior to visit.    Review of Systems:  As per HPI- otherwise negative.   Physical Examination: Vitals:   04/23/23 1141  BP: 130/72  Pulse: 81  Resp: 18  Temp: 97.6 F (36.4 C)  SpO2: 93%   Vitals:   04/23/23 1141  Weight: 224 lb (101.6 kg)  Height: 6' (1.829 m)   Body mass index is 30.38 kg/m. Ideal Body Weight: Weight in (lb) to have BMI = 25: 183.9  GEN: no acute distress.  Tall build, looks well HEENT: Atraumatic, Normocephalic.  Ears and Nose: No external deformity. CV: RRR, No M/G/R. No JVD. No thrill. No extra heart sounds. PULM: CTA B, no wheezes, crackles, rhonchi. No retractions. No resp. distress. No accessory muscle use. ABD: S, NT, ND, +BS. No rebound. No HSM. EXTR: No c/c/e PSYCH: Normally interactive. Conversant.  Patient has an approximately 3 inch diameter bruise on his right posterior calf, just inferior to the knee on the medial aspect.  It is appropriately tender to palpation but I do not believe this is a hematoma.  Normal Strength, he can push up on his toes.  Strong pulse in the left foot, normal knee range of motion The calf muscle itself is soft and nontender  Assessment and Plan: Ecchymosis  Patient seen today with concern of a bruise on his left calf which occurred when he nearly fell a few days ago.  He hit his leg on a coffee table edge.  He does have a bruise  in this area, but I reassured him it does not appear to be anything serious.  I think he will recover without incident.  However he is asked to let me know if any other concerns or if this seems to be getting worse  Signed Abbe Amsterdam, MD

## 2023-05-02 ENCOUNTER — Encounter: Payer: Self-pay | Admitting: Family Medicine

## 2023-05-05 NOTE — Telephone Encounter (Signed)
Message sent on Connor [name]'s chart.

## 2023-05-13 ENCOUNTER — Inpatient Hospital Stay: Payer: Medicare PPO

## 2023-05-13 ENCOUNTER — Encounter: Payer: Medicare PPO | Admitting: Family

## 2023-05-17 DIAGNOSIS — H353132 Nonexudative age-related macular degeneration, bilateral, intermediate dry stage: Secondary | ICD-10-CM | POA: Diagnosis not present

## 2023-05-27 ENCOUNTER — Inpatient Hospital Stay: Payer: Medicare PPO | Admitting: Family

## 2023-05-27 ENCOUNTER — Encounter: Payer: Self-pay | Admitting: Family

## 2023-05-27 ENCOUNTER — Other Ambulatory Visit: Payer: Self-pay | Admitting: Family

## 2023-05-27 ENCOUNTER — Other Ambulatory Visit: Payer: Self-pay

## 2023-05-27 ENCOUNTER — Inpatient Hospital Stay: Payer: Medicare PPO | Attending: Hematology & Oncology

## 2023-05-27 VITALS — BP 156/76 | HR 65 | Temp 98.0°F | Wt 226.0 lb

## 2023-05-27 DIAGNOSIS — D7589 Other specified diseases of blood and blood-forming organs: Secondary | ICD-10-CM | POA: Insufficient documentation

## 2023-05-27 DIAGNOSIS — D696 Thrombocytopenia, unspecified: Secondary | ICD-10-CM | POA: Insufficient documentation

## 2023-05-27 DIAGNOSIS — M069 Rheumatoid arthritis, unspecified: Secondary | ICD-10-CM

## 2023-05-27 LAB — CMP (CANCER CENTER ONLY)
ALT: 12 U/L (ref 0–44)
AST: 16 U/L (ref 15–41)
Albumin: 4 g/dL (ref 3.5–5.0)
Alkaline Phosphatase: 70 U/L (ref 38–126)
Anion gap: 5 (ref 5–15)
BUN: 15 mg/dL (ref 8–23)
CO2: 31 mmol/L (ref 22–32)
Calcium: 8.7 mg/dL — ABNORMAL LOW (ref 8.9–10.3)
Chloride: 106 mmol/L (ref 98–111)
Creatinine: 0.76 mg/dL (ref 0.61–1.24)
GFR, Estimated: >60 mL/min (ref >60)
Glucose, Bld: 106 mg/dL — ABNORMAL HIGH (ref 70–99)
Potassium: 4.2 mmol/L (ref 3.5–5.1)
Sodium: 142 mmol/L (ref 135–145)
Total Bilirubin: 1.1 mg/dL (ref 0.0–1.2)
Total Protein: 6.5 g/dL (ref 6.5–8.1)

## 2023-05-27 LAB — CBC WITH DIFFERENTIAL (CANCER CENTER ONLY)
Abs Immature Granulocytes: 0.05 10*3/uL (ref 0.00–0.07)
Basophils Absolute: 0 10*3/uL (ref 0.0–0.1)
Basophils Relative: 0 %
Eosinophils Absolute: 0.2 10*3/uL (ref 0.0–0.5)
Eosinophils Relative: 4 %
HCT: 39.7 % (ref 39.0–52.0)
Hemoglobin: 13 g/dL (ref 13.0–17.0)
Immature Granulocytes: 1 %
Lymphocytes Relative: 25 %
Lymphs Abs: 1.2 10*3/uL (ref 0.7–4.0)
MCH: 34.6 pg — ABNORMAL HIGH (ref 26.0–34.0)
MCHC: 32.7 g/dL (ref 30.0–36.0)
MCV: 105.6 fL — ABNORMAL HIGH (ref 80.0–100.0)
Monocytes Absolute: 0.7 10*3/uL (ref 0.1–1.0)
Monocytes Relative: 14 %
Neutro Abs: 2.8 10*3/uL (ref 1.7–7.7)
Neutrophils Relative %: 56 %
Platelet Count: 132 10*3/uL — ABNORMAL LOW (ref 150–400)
RBC: 3.76 MIL/uL — ABNORMAL LOW (ref 4.22–5.81)
RDW: 14 % (ref 11.5–15.5)
WBC Count: 4.9 10*3/uL (ref 4.0–10.5)
nRBC: 0 % (ref 0.0–0.2)

## 2023-05-27 LAB — IRON AND IRON BINDING CAPACITY (CC-WL,HP ONLY)
Iron: 133 ug/dL (ref 45–182)
Saturation Ratios: 45 % — ABNORMAL HIGH (ref 17.9–39.5)
TIBC: 295 ug/dL (ref 250–450)
UIBC: 162 ug/dL (ref 117–376)

## 2023-05-27 LAB — RETICULOCYTES
Immature Retic Fract: 11.9 % (ref 2.3–15.9)
RBC.: 3.83 MIL/uL — ABNORMAL LOW (ref 4.22–5.81)
Retic Count, Absolute: 60.9 10*3/uL (ref 19.0–186.0)
Retic Ct Pct: 1.6 % (ref 0.4–3.1)

## 2023-05-27 LAB — FERRITIN: Ferritin: 44 ng/mL (ref 24–336)

## 2023-05-27 LAB — SAVE SMEAR(SSMR), FOR PROVIDER SLIDE REVIEW

## 2023-05-27 LAB — LACTATE DEHYDROGENASE: LDH: 195 U/L — ABNORMAL HIGH (ref 98–192)

## 2023-05-27 NOTE — Progress Notes (Signed)
Hematology/Oncology Consultation   Name: Connor [name]      MRN: [id number]    Location: Room/bed info not found  Date: 05/27/2023 Time:10:58 AM   REFERRING PHYSICIAN:  Warner Mccreedy, MD  REASON FOR CONSULT:  Macrocytosis    DIAGNOSIS: Macrocytosis   HISTORY OF PRESENT ILLNESS: Connor [name] is a pleasant 89 yo gentleman with history of macrocytosis with and without anemia as well as mild thrombocytopenia.  His counts have remained stable and he is asymptomatic outside of occasional fatigue.  Hgb is 13.0, MCV 105, platelets 132 and WBC count 4.9.  Recent B 12 level was 963 and folate 11.  He has had iron deficiency in the past. He had issues with anticoagulation for atrial fib. This cancer severe GI blood loss. He states that after several ablations failed to treat his atrial fib he had a watchman device placed. He is now doing well.  He states that he recently had stool positive for blood but that his Gastroenterologist Dr. Alva Garnet put his colonoscopy on hold and recent stool test was negative.  He is taking senokot for constipation prevention.   He has history of prostate cancer treated with 40 fractions of radiation. He states that his latest PSA with urology was 0.025 No known familial history of cancer.  He has history of sleep apnea but does not wear a CPAP.  No diabetes or thyroid disease.  He has RA and is on weely Humira injections.  He is hard of hearing.  No issues with frequent or recurrent infections.  No fever, chills, n/v, cough, rash, dizziness, SOB, chest pain, palpitations, abdominal pain or changes in bowel or bladder habits.  Chronic swelling in his lower extremities is unchanged from baseline. No numbness or tingling in his extremities.  He states that he lost his balance and fell onto his couch 4 weeks ago. Thankfully he was not seriously injured.  No syncope.  He quit smoking in the 1980's. No recreational drug use. He has the occasional glass of wine or  beer.  Appetite and hydration are good. Weight is stable at 226 lbs.   ROS: All other 10 point review of systems is negative.   PAST MEDICAL HISTORY:   Past Medical History:  Diagnosis Date   Benign localized prostatic hyperplasia with lower urinary tract symptoms (LUTS)    urologist-- dr Retta Diones   Cancer [facility])    prostate   Chronic anticoagulation    Xarelto for afib   Chronic constipation    Chronic cystitis    Gross hematuria    History of DVT of lower extremity 06/08/2010   right femoral dvt dx post op total hip 05-08-2010   History of gastric ulcer    remote   History of kidney stones    one stone. remote hx   Hypercholesteremia    Hyperlipidemia    Hypertension    Dx by Dr. Champ Mungo around age  7    Macular degeneration    dry   NICM (nonischemic cardiomyopathy) (HCC)    per echo 06-11-2017,  ef 45-50% with diffuse hypokinesis,  G1DD   OSA on CPAP 2017   compliant with CPAP.  managed by Dr Vassie Loll.   Osteoarthritis    Other urethral stricture, male, meatal    chronic;  pt self dilates   Paroxysmal atrial fibrillation [facility]) cardiologist-- dr Johney Frame   first dx 2009/   a. documented by Dr Otho Perl on office EKG 9/12. b. maintained on Tikosyn and  Xarelto. c. s/p DCCV 's.  d.  x3  EP w/ ablation atrial fib last one 12-05-2016   Prediabetes    Premature ventricular contraction    Presence of Watchman left atrial appendage closure device 05/30/2022   Watchman FLX 24mm by Dr. Lalla Brothers   PVC's (premature ventricular contractions)    RA (rheumatoid arthritis) [facility])    rheumatologist--- Dr Fontaine No,  treated with Humira   RBBB (right bundle branch block)    Hx of a fib, ablation Aug 2018   Rheumatoid arthritis (HCC)    Vitamin D deficiency    Wears hearing aid in both ears     ALLERGIES: Allergies  Allergen Reactions   Penicillins Itching, Swelling and Other (See Comments)    Did it involve swelling of the face/tongue/throat, SOB, or low BP? No Did it involve  sudden or severe rash/hives, skin peeling, or any reaction on the inside of your mouth or nose? No Did you need to seek medical attention at a hospital or doctor's office? No When did it last happen?      30 + years If all above answers are "NO", may proceed with cephalosporin use.        MEDICATIONS:  Current Outpatient Medications on File Prior to Visit  Medication Sig Dispense Refill   acetaminophen (TYLENOL) 500 MG tablet Take 1,000 mg by mouth 2 (two) times daily as needed for moderate pain.     aspirin EC 81 MG tablet Take 1 tablet (81 mg total) by mouth daily. Swallow whole.     atorvastatin (LIPITOR) 10 MG tablet Take 1 tablet (10 mg total) by mouth daily. 90 tablet 1   Carboxymethylcellulose Sodium (THERATEARS OP) Apply 1 drop to eye daily as needed (dry eyes).     cephALEXin (KEFLEX) 500 MG capsule Take 2,000 mg by mouth as directed. Dental appointments (Patient not taking: Reported on 03/31/2023)     Cholecalciferol (VITAMIN D3) 5000 UNITS TABS Take 5,000 Units by mouth every morning.     Cranberry 1000 MG CAPS Take 4,000 mg by mouth 2 (two) times daily.     HUMIRA PEN 40 MG/0.4ML PNKT Inject 40 mg as directed every 7 (seven) days. Every Tuesday  6   lisinopril (ZESTRIL) 40 MG tablet TAKE 1 TABLET BY MOUTH EVERY DAY 90 tablet 3   Melatonin 10 MG CHEW Chew 3 Pieces of gum by mouth at bedtime. gummy     metoprolol succinate (TOPROL-XL) 50 MG 24 hr tablet TAKE 1/2 TABLET IN THE MORNING AND TAKE 1 TABLET IN THE EVENING (Patient taking differently: daily. Taking 1 tablet in the morning and 1 tablet in the evening.) 135 tablet 3   Multiple Vitamins-Minerals (PRESERVISION AREDS 2) CAPS Take 1 capsule by mouth 2 (two) times a day.     pantoprazole (PROTONIX) 40 MG tablet TAKE 1 TABLET BY MOUTH EVERY DAY 90 tablet 3   senna (SENOKOT) 8.6 MG TABS tablet Take 2 tablets by mouth at bedtime. Per patient taking 2 tablets daily     solifenacin (VESICARE) 10 MG tablet Take 10 mg by mouth at  bedtime.      tamsulosin (FLOMAX) 0.4 MG CAPS capsule Take 1 capsule (0.4 mg total) by mouth daily after supper. 30 capsule 5   Testosterone 20.25 MG/ACT (1.62%) GEL Apply 1 Pump topically at bedtime. Apply to shoulders     vitamin B-12 (CYANOCOBALAMIN) 500 MCG tablet Take 500 mcg by mouth every evening.      No current facility-administered medications  on file prior to visit.     PAST SURGICAL HISTORY Past Surgical History:  Procedure Laterality Date   ABLATION OF DYSRHYTHMIC FOCUS  08/29/2015   ATRIAL FIBRILLATION ABLATION  12/05/2016   ATRIAL FIBRILLATION ABLATION N/A 12/05/2016   Procedure: Atrial Fibrillation Ablation;  Surgeon: Hillis Range, MD;  [facility] LAB;  Service: Cardiovascular;  Laterality: N/A;   ATRIAL FIBRILLATION ABLATION N/A 02/29/2020   Procedure: ATRIAL FIBRILLATION ABLATION;  Surgeon: Hillis Range, MD;  [facility] LAB;  Service: Cardiovascular;  Laterality: N/A;   ATRIAL FLUTTER ABLATION  09/2010   by Karren Burly Right 1990s   CARDIOVERSION N/A 10/28/2012   Procedure: CARDIOVERSION;  Surgeon: Kathleene Hazel, MD;  [facility] OR;  Service: Cardiovascular;  Laterality: N/A;   CATARACT EXTRACTION W/ INTRAOCULAR LENS  IMPLANT, BILATERAL  2019   CYSTOSCOPY WITH BIOPSY N/A 09/03/2018   Procedure: CYSTOSCOPY WITH FULGURATION Coralee Pesa;  Surgeon: Marcine Matar, MD;  Location: [facility];  Service: Urology;  Laterality: N/A;  30 MINS   CYSTOSCOPY WITH FULGERATION N/A 01/23/2022   Procedure: CYSTOSCOPY WITH FULGERATION OF PROSTATIC URETHRA;  Surgeon: Marcine Matar, MD;  Location: [facility];  Service: Urology;  Laterality: N/A;  30 MINS   DENTAL SURGERY     ELECTROPHYSIOLOGIC STUDY N/A 08/29/2015   Procedure: Atrial Fibrillation Ablation;  Surgeon: Hillis Range, MD;  Location: [facility] INVASIVE CV LAB;  Service: Cardiovascular;  Laterality: N/A;   KNEE SURGERY Bilateral x3   1960s & 1970s   open   LEFT ATRIAL APPENDAGE OCCLUSION  N/A 05/30/2022   Procedure: LEFT ATRIAL APPENDAGE OCCLUSION;  Surgeon: Lanier Prude, MD;  [facility] CV LAB;  Service: Cardiovascular;  Laterality: N/A;   LEFT HEART CATHETERIZATION WITH CORONARY ANGIOGRAM N/A 06/07/2011   Procedure: LEFT HEART CATHETERIZATION WITH CORONARY ANGIOGRAM;  Surgeon: Peter M Swaziland, MD;  Location: [facility] CATH LAB;  Service: Cardiovascular;  Laterality: N/A;    Normal coronary arteries,  low normal LVF   TEE WITHOUT CARDIOVERSION N/A 05/30/2022   Procedure: TRANSESOPHAGEAL ECHOCARDIOGRAM (TEE);  Surgeon: Lanier Prude, MD;  Location: [facility] INVASIVE CV LAB;  Service: Cardiovascular;  Laterality: N/A;   TONSILLECTOMY AND ADENOIDECTOMY  age 75   TOTAL HIP ARTHROPLASTY Right 06/04/10    dr Thurston Hole  @[facility]    TOTAL KNEE ARTHROPLASTY Bilateral right 1995;  left 1997   TRANSURETHRAL RESECTION OF PROSTATE  06-29-2002   dr humphries  @[facility]    TRANSURETHRAL RESECTION OF PROSTATE N/A 07/06/2018   Procedure: TRANSURETHRAL RESECTION OF THE PROSTATE (TURP);  Surgeon: Marcine Matar, MD;  Location: [facility];  Service: Urology;  Laterality: N/A;  45 MINS    FAMILY HISTORY: Family History  Problem Relation Age of Onset   Heart attack Father    Lung disease Mother    Arrhythmia Sister    Atrial fibrillation Brother    Diabetes Neg Hx     SOCIAL HISTORY:  reports that he quit smoking about 45 years ago. His smoking use included cigarettes. He started smoking about 75 years ago. He has a 30 pack-year smoking history. He has never used smokeless tobacco. He reports current alcohol use of about 1.0 standard drink of alcohol per week. He reports that he does not use drugs.  PERFORMANCE STATUS: The patient's performance status is 0 - Asymptomatic  PHYSICAL EXAM: Most Recent Vital Signs: There were no vitals taken for this visit. BP (!) 156/76 (BP Location: Right Arm, Patient Position: Sitting)   Pulse  65   Temp 98 F (36.7 C) (Oral)   Wt 226 lb (102.5  kg)   SpO2 99%   BMI 30.65 kg/m   General Appearance:    Alert, cooperative, no distress, appears stated age  Head:    Normocephalic, without obvious abnormality, atraumatic  Eyes:    PERRL, conjunctiva/corneas clear, EOM's intact, fundi    benign, both eyes             Throat:   Lips, mucosa, and tongue normal; teeth and gums normal  Neck:   Supple, symmetrical, trachea midline, no adenopathy;       thyroid:  No enlargement/tenderness/nodules; no carotid   bruit or JVD  Back:     Symmetric, no curvature, ROM normal, no CVA tenderness  Lungs:     Clear to auscultation bilaterally, respirations unlabored  Chest wall:    No tenderness or deformity  Heart:    Regular rate and rhythm, S1 and S2 normal, no murmur, rub   or gallop  Abdomen:     Soft, non-tender, bowel sounds active all four quadrants,    no masses, no organomegaly        Extremities:   Extremities normal, atraumatic, no cyanosis or edema  Pulses:   2+ and symmetric all extremities  Skin:   Skin color, texture, turgor normal, no rashes or lesions  Lymph nodes:   Cervical, supraclavicular, and axillary nodes normal  Neurologic:   CNII-XII intact. Normal strength, sensation and reflexes      throughout    LABORATORY DATA:  No results found. However, due to the size of the patient record, not all encounters were searched. Please check Results Review for a complete set of results.    RADIOGRAPHY: No results found.     PATHOLOGY: None  ASSESSMENT/PLAN: Connor [name] is a very pleasant 89 yo caucasian gentleman with greater than 12 year history of intermittent macrocytosis with and without anemia as well as mild thrombocytopenia.  His counts remain stable and he is doing well at this time. Blood smear viewed by Dr. Myna Hidalgo. No abnormality or evidence of malignancy noted.  Iron studies ar pending.  We will plan to see him back in 1 year.   All questions were answered. The patient knows to call the clinic with any problems,  questions or concerns. We can certainly see the patient much sooner if necessary.  The patient was discussed with Dr. Myna Hidalgo and he is in agreement with the aforementioned.   Eileen Stanford, NP

## 2023-05-28 DIAGNOSIS — L821 Other seborrheic keratosis: Secondary | ICD-10-CM | POA: Diagnosis not present

## 2023-05-28 DIAGNOSIS — D1801 Hemangioma of skin and subcutaneous tissue: Secondary | ICD-10-CM | POA: Diagnosis not present

## 2023-05-28 DIAGNOSIS — L720 Epidermal cyst: Secondary | ICD-10-CM | POA: Diagnosis not present

## 2023-05-28 DIAGNOSIS — D692 Other nonthrombocytopenic purpura: Secondary | ICD-10-CM | POA: Diagnosis not present

## 2023-05-28 DIAGNOSIS — D3617 Benign neoplasm of peripheral nerves and autonomic nervous system of trunk, unspecified: Secondary | ICD-10-CM | POA: Diagnosis not present

## 2023-05-29 ENCOUNTER — Encounter: Payer: Self-pay | Admitting: Cardiology

## 2023-05-30 MED ORDER — METOPROLOL SUCCINATE ER 50 MG PO TB24: 50.0000 mg | ORAL_TABLET | Freq: Two times a day (BID) | ORAL | 3 refills | Status: DC

## 2023-06-12 ENCOUNTER — Telehealth: Payer: Self-pay | Admitting: Family Medicine

## 2023-06-12 NOTE — Telephone Encounter (Signed)
 Copied from CRM [id number]. Topic: Medicare AWV >> Jun 12, 2023  9:30 AM Nathanel DEL wrote: Reason for CRM: Called LVM 06/11/2023 to schedule AWV. Please schedule Virtual or Telehealth visits ONLY.   Nathanel Paschal; Care Guide Ambulatory Clinical Support  l [facility] Direct Dial: [phone]

## 2023-06-16 DIAGNOSIS — H353132 Nonexudative age-related macular degeneration, bilateral, intermediate dry stage: Secondary | ICD-10-CM | POA: Diagnosis not present

## 2023-06-19 DIAGNOSIS — C61 Malignant neoplasm of prostate: Secondary | ICD-10-CM | POA: Diagnosis not present

## 2023-06-19 DIAGNOSIS — E349 Endocrine disorder, unspecified: Secondary | ICD-10-CM | POA: Diagnosis not present

## 2023-06-26 ENCOUNTER — Ambulatory Visit: Payer: Medicare PPO | Admitting: *Deleted

## 2023-06-26 DIAGNOSIS — Z0000 Encounter for general adult medical examination without abnormal findings: Secondary | ICD-10-CM | POA: Diagnosis not present

## 2023-06-26 NOTE — Progress Notes (Signed)
Subjective:   Connor [name] is a 89 y.o. male who presents for Medicare Annual/Subsequent preventive examination.  Visit Complete: Virtual I connected with  Connor [name] on 06/26/23 by a audio enabled telemedicine application and verified that I am speaking with the correct person using two identifiers.  Patient Location: Home  Provider Location: Office/Clinic  I discussed the limitations of evaluation and management by telemedicine. The patient expressed understanding and agreed to proceed.  Vital Signs: Because this visit was a virtual/telehealth visit, some criteria may be missing or patient reported. Any vitals not documented were not able to be obtained and vitals that have been documented are patient reported.   Cardiac Risk Factors include: advanced age (>53men, >56 women);dyslipidemia;hypertension;male gender     Objective:    There were no vitals filed for this visit. There is no height or weight on file to calculate BMI.     06/26/2023   11:02 AM 05/27/2023   11:07 AM 06/25/2022   11:05 AM 05/30/2022    3:36 PM 05/30/2022    7:44 AM 04/08/2022    8:41 AM 01/23/2022   11:38 AM  Advanced Directives  Does Patient Have a Medical Advance Directive? Yes Yes Yes No No No   Type of Estate agent of [place];Living will Healthcare Power of [place];Living will Healthcare Power of [place];Living will    Healthcare Power of [place];Living will  Does patient want to make changes to medical advance directive? No - Patient declined No - Patient declined No - Patient declined      Copy of Healthcare Power of Attorney in Chart? No - copy requested No - copy requested No - copy requested      Would patient like information on creating a medical advance directive?  No - Patient declined  No - Patient declined No - Patient declined No - Patient declined Yes (Inpatient - patient requests chaplain consult to create a medical advance directive)    Current  Medications (verified) Outpatient Encounter Medications as of 06/26/2023  Medication Sig   acetaminophen (TYLENOL) 500 MG tablet Take 1,000 mg by mouth 2 (two) times daily as needed for moderate pain.   aspirin EC 81 MG tablet Take 1 tablet (81 mg total) by mouth daily. Swallow whole.   atorvastatin (LIPITOR) 10 MG tablet Take 1 tablet (10 mg total) by mouth daily.   Carboxymethylcellulose Sodium (THERATEARS OP) Apply 1 drop to eye daily as needed (dry eyes).   cephALEXin (KEFLEX) 500 MG capsule Take 2,000 mg by mouth as directed. Dental appointments (Patient not taking: Reported on 03/31/2023)   Cholecalciferol (VITAMIN D3) 5000 UNITS TABS Take 5,000 Units by mouth every morning.   clindamycin (CLEOCIN T) 1 % external solution Apply 1 Application topically daily.   Cranberry 1000 MG CAPS Take 4,000 mg by mouth 2 (two) times daily.   FLUZONE HIGH-DOSE 0.5 ML injection Inject 0.5 mLs into the muscle once.   HUMIRA PEN 40 MG/0.4ML PNKT Inject 40 mg as directed every 7 (seven) days. Every Tuesday   lisinopril (ZESTRIL) 40 MG tablet TAKE 1 TABLET BY MOUTH EVERY DAY   Melatonin 10 MG CHEW Chew 3 Pieces of gum by mouth at bedtime. gummy   metoprolol succinate (TOPROL-XL) 50 MG 24 hr tablet Take 1 tablet (50 mg total) by mouth 2 (two) times daily. Take with or immediately following a meal.   Multiple Vitamins-Minerals (PRESERVISION AREDS 2) CAPS Take 1 capsule by mouth 2 (two) times a day.  pantoprazole (PROTONIX) 40 MG tablet TAKE 1 TABLET BY MOUTH EVERY DAY   senna (SENOKOT) 8.6 MG TABS tablet Take 2 tablets by mouth at bedtime. Per patient taking 2 tablets daily   solifenacin (VESICARE) 10 MG tablet Take 10 mg by mouth at bedtime.    SPIKEVAX syringe Inject 0.5 mLs into the muscle once.   tamsulosin (FLOMAX) 0.4 MG CAPS capsule Take 1 capsule (0.4 mg total) by mouth daily after supper.   Testosterone 20.25 MG/ACT (1.62%) GEL Apply 1 Pump topically at bedtime. Apply to shoulders   vitamin B-12  (CYANOCOBALAMIN) 500 MCG tablet Take 500 mcg by mouth every evening.    No facility-administered encounter medications on file as of 06/26/2023.    Allergies (verified) Penicillins   History: Past Medical History:  Diagnosis Date   Allergy 1985   Benign localized prostatic hyperplasia with lower urinary tract symptoms (LUTS)    urologist-- dr dahlstedt   [facility] 07/2018   prostate   Cataract 12/2012   CHF (congestive heart failure) (HCC) ?   Chronic anticoagulation    Xarelto for afib   Chronic constipation    Chronic cystitis    Clotting disorder (HCC)    GERD (gastroesophageal reflux disease)    Gross hematuria    Heart murmur ?   History of DVT of lower extremity 06/08/2010   right femoral dvt dx post op total hip 05-08-2010   History of gastric ulcer    remote   History of kidney stones    one stone. remote hx   Hypercholesteremia    Hyperlipidemia ?   Hypertension    Dx by Dr. Champ Mungo around age  15    Macular degeneration    dry   NICM (nonischemic cardiomyopathy) (HCC)    per echo 06-11-2017,  ef 45-50% with diffuse hypokinesis,  G1DD   OSA on CPAP 2017   compliant with CPAP.  managed by Dr Vassie Loll.   Osteoarthritis    Other urethral stricture, male, meatal    chronic;  pt self dilates   Paroxysmal atrial fibrillation [facility]) cardiologist-- dr Johney Frame   first dx 2009/   a. documented by Dr Otho Perl on office EKG 9/12. b. maintained on Tikosyn and Xarelto. c. s/p DCCV 's.  d.  x3  EP w/ ablation atrial fib last one 12-05-2016   Prediabetes    Premature ventricular contraction    Presence of Watchman left atrial appendage closure device 05/30/2022   Watchman FLX 24mm by Dr. Lalla Brothers   PVC's (premature ventricular contractions)    RA (rheumatoid arthritis) [facility])    rheumatologist--- Dr Fontaine No,  treated with Humira   RBBB (right bundle branch block)    Hx of a fib, ablation Aug 2018   Rheumatoid arthritis (HCC)    Sleep apnea    Ulcer    Vitamin D  deficiency    Wears hearing aid in both ears    Past Surgical History:  Procedure Laterality Date   ABLATION OF DYSRHYTHMIC FOCUS  08/29/2015   ATRIAL FIBRILLATION ABLATION  12/05/2016   ATRIAL FIBRILLATION ABLATION N/A 12/05/2016   Procedure: Atrial Fibrillation Ablation;  Surgeon: Hillis Range, MD;  Location: [facility] INVASIVE CV LAB;  Service: Cardiovascular;  Laterality: N/A;   ATRIAL FIBRILLATION ABLATION N/A 02/29/2020   Procedure: ATRIAL FIBRILLATION ABLATION;  Surgeon: Hillis Range, MD;  Location: [facility] INVASIVE CV LAB;  Service: Cardiovascular;  Laterality: N/A;   ATRIAL FLUTTER ABLATION  09/2010   by Karren Burly Right  1990s   CARDIOVERSION N/A 10/28/2012   Procedure: CARDIOVERSION;  Surgeon: Kathleene Hazel, MD;  Location[facility];  Service: Cardiovascular;  Laterality: N/A;   CATARACT EXTRACTION W/ INTRAOCULAR LENS  IMPLANT, BILATERAL  2019   CYSTOSCOPY WITH BIOPSY N/A 09/03/2018   Procedure: CYSTOSCOPY WITH FULGURATION Coralee Pesa;  Surgeon: Marcine Matar, MD;  Location: [facility] ORS;  Service: Urology;  Laterality: N/A;  30 MINS   CYSTOSCOPY WITH FULGERATION N/A 01/23/2022   Procedure: CYSTOSCOPY WITH FULGERATION OF PROSTATIC URETHRA;  Surgeon: Marcine Matar, MD;  Location: [facility] ORS;  Service: Urology;  Laterality: N/A;  30 MINS   DENTAL SURGERY     ELECTROPHYSIOLOGIC STUDY N/A 08/29/2015   Procedure: Atrial Fibrillation Ablation;  Surgeon: Hillis Range, MD;  Location: [facility] INVASIVE CV LAB;  Service: Cardiovascular;  Laterality: N/A;   EYE SURGERY     Cataracts   JOINT REPLACEMENT  1995, 1997   KNEE SURGERY Bilateral x3   1960s & 1970s   open   LEFT ATRIAL APPENDAGE OCCLUSION N/A 05/30/2022   Procedure: LEFT ATRIAL APPENDAGE OCCLUSION;  Surgeon: Lanier Prude, MD;  Location: [facility] INVASIVE CV LAB;  Service: Cardiovascular;  Laterality: N/A;   LEFT HEART CATHETERIZATION WITH CORONARY ANGIOGRAM N/A 06/07/2011   Procedure: LEFT HEART CATHETERIZATION WITH CORONARY  ANGIOGRAM;  Surgeon: Peter M Swaziland, MD;  Location: [facility] CATH LAB;  Service: Cardiovascular;  Laterality: N/A;    Normal coronary arteries,  low normal LVF   TEE WITHOUT CARDIOVERSION N/A 05/30/2022   Procedure: TRANSESOPHAGEAL ECHOCARDIOGRAM (TEE);  Surgeon: Lanier Prude, MD;  Location: [facility] INVASIVE CV LAB;  Service: Cardiovascular;  Laterality: N/A;   TONSILLECTOMY AND ADENOIDECTOMY  age 28   TOTAL HIP ARTHROPLASTY Right 06/04/10    dr Thurston Hole  @[facility]    TOTAL KNEE ARTHROPLASTY Bilateral right 1995;  left 1997   TRANSURETHRAL RESECTION OF PROSTATE  06-29-2002   dr humphries  @[facility]    TRANSURETHRAL RESECTION OF PROSTATE N/A 07/06/2018   Procedure: TRANSURETHRAL RESECTION OF THE PROSTATE (TURP);  Surgeon: Marcine Matar, MD;  Location: [facility];  Service: Urology;  Laterality: N/A;  45 MINS   Family History  Problem Relation Age of Onset   Heart attack Father    Heart disease Father    Obesity Father    Lung disease Mother    Arthritis Mother    Arrhythmia Sister    Atrial fibrillation Brother    Diabetes Neg Hx    Social History   Socioeconomic History   Marital status: Married    Spouse name: Claris Che   Number of children: Not on file   Years of education: Not on file   Highest education level: Bachelor's degree (e.g., BA, AB, BS)  Occupational History   Occupation: Retired  Tobacco Use   Smoking status: Former    Current packs/day: 0.00    Average packs/day: 1 pack/day for 30.0 years (30.0 ttl pk-yrs)    Types: Cigarettes    Start date: 05/06/1948    Quit date: 05/06/1978    Years since quitting: 45.1   Smokeless tobacco: Never   Tobacco comments:    Former smoker 03/14/2021  Vaping Use   Vaping status: Never Used  Substance and Sexual Activity   Alcohol use: Yes    Alcohol/week: 1.0 standard drink of alcohol    Types: 1 Glasses of wine per week    Comment: occasionally wine and beer   Drug use: Never   Sexual activity: Not Currently    Birth  control/protection: Surgical  Other Topics Concern   Not on file  Social History Narrative   he patient lives in [place] with his spouse.  Retired.   Social Drivers of Corporate investment banker Strain: Low Risk  (06/26/2023)   Overall Financial Resource Strain (CARDIA)    Difficulty of Paying Living Expenses: Not hard at all  Food Insecurity: No Food Insecurity (06/26/2023)   Hunger Vital Sign    Worried About Running Out of Food in the Last Year: Never true    Ran Out of Food in the Last Year: Never true  Transportation Needs: No Transportation Needs (06/26/2023)   PRAPARE - Administrator, Civil Service (Medical): No    Lack of Transportation (Non-Medical): No  Physical Activity: Inactive (06/26/2023)   Exercise Vital Sign    Days of Exercise per Week: 0 days    Minutes of Exercise per Session: 0 min  Stress: No Stress Concern Present (06/26/2023)   [employer] of Occupational Health - Occupational Stress Questionnaire    Feeling of Stress : Not at all  Social Connections: Moderately Integrated (06/26/2023)   Social Connection and Isolation Panel [NHANES]    Frequency of Communication with Friends and Family: More than three times a week    Frequency of Social Gatherings with Friends and Family: Three times a week    Attends Religious Services: More than 4 times per year    Active Member of Clubs or Organizations: No    Attends Banker Meetings: Never    Marital Status: Married    Tobacco Counseling Counseling given: Not Answered Tobacco comments: Former smoker 03/14/2021   Clinical Intake:  Pre-visit preparation completed: Yes  Pain : No/denies pain  Diabetes: No  How often do you need to have someone help you when you read instructions, pamphlets, or other written materials from your doctor or pharmacy?: 1 - Never  Interpreter Needed?: No  Information entered by :: Donne Anon, CMA   Activities of Daily Living    06/26/2023    11:03 AM  In your present state of health, do you have any difficulty performing the following activities:  Hearing? 1  Comment wears hearing aids  Vision? 0  Difficulty concentrating or making decisions? 0  Walking or climbing stairs? 1  Dressing or bathing? 0  Doing errands, shopping? 0  Preparing Food and eating ? N  Using the Toilet? N  In the past six months, have you accidently leaked urine? N  Do you have problems with loss of bowel control? N  Managing your Medications? N  Managing your Finances? N  Housekeeping or managing your Housekeeping? N    Patient Care Team: Copland, Gwenlyn Found, MD as PCP - General (Family Medicine) Jens Som Madolyn Frieze, MD as PCP - Cardiology (Cardiology) Lanier Prude, MD as PCP - Electrophysiology (Cardiology) Salvatore Marvel, MD (Orthopedic Surgery)  Indicate any recent Medical Services you may have received from other than [facility] providers in the past year (date may be approximate).     Assessment:   This is a routine wellness examination for Talen.  Hearing/Vision screen No results found.   Goals Addressed   None    Depression Screen    06/26/2023   11:08 AM 06/25/2022   11:08 AM 03/18/2022   10:17 AM 09/12/2021   10:12 AM 06/21/2021    9:49 AM 12/20/2020    2:54 PM 09/07/2020   10:01 AM  PHQ 2/9 Scores  PHQ - 2  Score 0 0 0 0 0 0 0    Fall Risk    06/26/2023   11:07 AM 06/18/2022    8:45 AM 03/18/2022   10:17 AM 09/12/2021   10:12 AM 06/21/2021    9:48 AM  Fall Risk   Falls in the past year? 1 0 0 0 0  Comment stepped on dog bone      Number falls in past yr: 0 0 0 0 0  Injury with Fall? 1 0 0 0 0  Risk for fall due to : No Fall Risks No Fall Risks     Follow up Falls evaluation completed Falls evaluation completed Falls evaluation completed  Falls prevention discussed    MEDICARE RISK AT HOME: Medicare Risk at Home Any stairs in or around the home?: Yes If so, are there any without handrails?: No Home free of loose  throw rugs in walkways, pet beds, electrical cords, etc?: Yes Adequate lighting in your home to reduce risk of falls?: Yes Life alert?: No Use of a cane, walker or w/c?: No Grab bars in the bathroom?: Yes Shower chair or bench in shower?: Yes Elevated toilet seat or a handicapped toilet?: No  TIMED UP AND GO:  Was the test performed?  No    Cognitive Function:        06/26/2023   11:12 AM 06/25/2022   11:16 AM 05/20/2019    1:31 PM  6CIT Screen  What Year? 0 points 0 points 0 points  What month? 0 points 0 points 0 points  What time? 0 points 0 points 0 points  Count back from 20 0 points 0 points 0 points  Months in reverse 0 points 0 points 0 points  Repeat phrase 0 points 0 points 0 points  Total Score 0 points 0 points 0 points    Immunizations Immunization History  Administered Date(s) Administered   Fluad Quad(high Dose 65+) 01/13/2021   Fluzone Influenza virus vaccine,trivalent (IIV3), split virus 02/27/2011, 02/05/2012   Influenza Split 02/03/2013, 02/06/2022   Influenza, High Dose Seasonal PF 02/03/2017, 01/31/2020   Influenza,inj,Quad PF,6+ Mos 02/28/2015   Influenza-Unspecified 02/24/2014, 01/09/2016, 02/05/2017, 01/17/2018, 01/10/2023   PFIZER(Purple Top)SARS-COV-2 Vaccination 05/19/2019, 06/08/2019, 01/31/2020, 09/07/2020, 01/10/2023   Pfizer Covid-19 Vaccine Bivalent Booster 72yrs & up 01/13/2021   Pneumococcal Conjugate-13 09/16/2013   Pneumococcal Polysaccharide-23 05/06/1998, 09/20/2015   Tdap 11/08/2016   Unspecified SARS-COV-2 Vaccination 02/06/2022   Zoster Recombinant(Shingrix) 01/19/2019, 04/21/2019   Zoster, Live 06/16/2006    TDAP status: Up to date  Flu Vaccine status: Up to date  Pneumococcal vaccine status: Up to date  Covid-19 vaccine status: Information provided on how to obtain vaccines.   Qualifies for Shingles Vaccine? Yes   Zostavax completed Yes   Shingrix Completed?: Yes  Screening Tests Health Maintenance  Topic Date Due    COVID-19 Vaccine (8 - 2024-25 season) 03/07/2023   Medicare Annual Wellness (AWV)  06/26/2023   DTaP/Tdap/Td (2 - Td or Tdap) 11/09/2026   Pneumonia Vaccine 59+ Years old  Completed   INFLUENZA VACCINE  Completed   Zoster Vaccines- Shingrix  Completed   HPV VACCINES  Aged Out    Health Maintenance  Health Maintenance Due  Topic Date Due   COVID-19 Vaccine (8 - 2024-25 season) 03/07/2023   Medicare Annual Wellness (AWV)  06/26/2023    Colorectal cancer screening: No longer required.   Lung Cancer Screening: (Low Dose CT Chest recommended if Age 39-80 years, 20 pack-year currently smoking OR have quit w/in  15years.) does not qualify.   Additional Screening:  Hepatitis C Screening: does not qualify  Vision Screening: Recommended annual ophthalmology exams for early detection of glaucoma and other disorders of the eye. Is the patient up to date with their annual eye exam?  No  Who is the provider or what is the name of the office in which the patient attends annual eye exams? Dr. Ashley Royalty If pt is not established with a provider, would they like to be referred to a provider to establish care? No .   Dental Screening: Recommended annual dental exams for proper oral hygiene  Diabetic Foot Exam: N/a  Community Resource Referral / Chronic Care Management: CRR required this visit?  No   CCM required this visit?  No     Plan:     I have personally reviewed and noted the following in the patient's chart:   Medical and social history Use of alcohol, tobacco or illicit drugs  Current medications and supplements including opioid prescriptions. Patient is not currently taking opioid prescriptions. Functional ability and status Nutritional status Physical activity Advanced directives List of other physicians Hospitalizations, surgeries, and ER visits in previous 12 months Vitals Screenings to include cognitive, depression, and falls Referrals and appointments  In  addition, I have reviewed and discussed with patient certain preventive protocols, quality metrics, and best practice recommendations. A written personalized care plan for preventive services as well as general preventive health recommendations were provided to patient.     Donne Anon, CMA   06/26/2023   After Visit Summary: (MyChart) Due to this being a telephonic visit, the after visit summary with patients personalized plan was offered to patient via MyChart   Nurse Notes: None

## 2023-06-26 NOTE — Patient Instructions (Signed)
Connor [name] , Thank you for taking time to come for your Medicare Wellness Visit. I appreciate your ongoing commitment to your health goals. Please review the following plan we discussed and let me know if I can assist you in the future.     This is a list of the screening recommended for you and due dates:  Health Maintenance  Topic Date Due   COVID-19 Vaccine (8 - 2024-25 season) 03/07/2023   Medicare Annual Wellness Visit  06/25/2024   DTaP/Tdap/Td vaccine (2 - Td or Tdap) 11/09/2026   Pneumonia Vaccine  Completed   Flu Shot  Completed   Zoster (Shingles) Vaccine  Completed   HPV Vaccine  Aged Out    Next appointment: Follow up in one year for your annual wellness visit.   Preventive Care 40 Years and Older, Male Preventive care refers to lifestyle choices and visits with your health care provider that can promote health and wellness. What does preventive care include? A yearly physical exam. This is also called an annual well check. Dental exams once or twice a year. Routine eye exams. Ask your health care provider how often you should have your eyes checked. Personal lifestyle choices, including: Daily care of your teeth and gums. Regular physical activity. Eating a healthy diet. Avoiding tobacco and drug use. Limiting alcohol use. Practicing safe sex. Taking low doses of aspirin every day. Taking vitamin and mineral supplements as recommended by your health care provider. What happens during an annual well check? The services and screenings done by your health care provider during your annual well check will depend on your age, overall health, lifestyle risk factors, and family history of disease. Counseling  Your health care provider may ask you questions about your: Alcohol use. Tobacco use. Drug use. Emotional well-being. Home and relationship well-being. Sexual activity. Eating habits. History of falls. Memory and ability to understand (cognition). Work and  work Astronomer. Screening  You may have the following tests or measurements: Height, weight, and BMI. Blood pressure. Lipid and cholesterol levels. These may be checked every 5 years, or more frequently if you are over 2 years old. Skin check. Lung cancer screening. You may have this screening every year starting at age 73 if you have a 30-pack-year history of smoking and currently smoke or have quit within the past 15 years. Fecal occult blood test (FOBT) of the stool. You may have this test every year starting at age 51. Flexible sigmoidoscopy or colonoscopy. You may have a sigmoidoscopy every 5 years or a colonoscopy every 10 years starting at age 71. Prostate cancer screening. Recommendations will vary depending on your family history and other risks. Hepatitis C blood test. Hepatitis B blood test. Sexually transmitted disease (STD) testing. Diabetes screening. This is done by checking your blood sugar (glucose) after you have not eaten for a while (fasting). You may have this done every 1-3 years. Abdominal aortic aneurysm (AAA) screening. You may need this if you are a current or former smoker. Osteoporosis. You may be screened starting at age 53 if you are at high risk. Talk with your health care provider about your test results, treatment options, and if necessary, the need for more tests. Vaccines  Your health care provider may recommend certain vaccines, such as: Influenza vaccine. This is recommended every year. Tetanus, diphtheria, and acellular pertussis (Tdap, Td) vaccine. You may need a Td booster every 10 years. Zoster vaccine. You may need this after age 100. Pneumococcal 13-valent conjugate (PCV13) vaccine.  One dose is recommended after age 34. Pneumococcal polysaccharide (PPSV23) vaccine. One dose is recommended after age 21. Talk to your health care provider about which screenings and vaccines you need and how often you need them. This information is not intended to  replace advice given to you by your health care provider. Make sure you discuss any questions you have with your health care provider. Document Released: 05/19/2015 Document Revised: 01/10/2016 Document Reviewed: 02/21/2015 Elsevier Interactive Patient Education  2017 [employer].  Fall Prevention in the Home Falls can cause injuries. They can happen to people of all ages. There are many things you can do to make your home safe and to help prevent falls. What can I do on the outside of my home? Regularly fix the edges of walkways and driveways and fix any cracks. Remove anything that might make you trip as you walk through a door, such as a raised step or threshold. Trim any bushes or trees on the path to your home. Use bright outdoor lighting. Clear any walking paths of anything that might make someone trip, such as rocks or tools. Regularly check to see if handrails are loose or broken. Make sure that both sides of any steps have handrails. Any raised decks and porches should have guardrails on the edges. Have any leaves, snow, or ice cleared regularly. Use sand or salt on walking paths during winter. Clean up any spills in your garage right away. This includes oil or grease spills. What can I do in the bathroom? Use night lights. Install grab bars by the toilet and in the tub and shower. Do not use towel bars as grab bars. Use non-skid mats or decals in the tub or shower. If you need to sit down in the shower, use a plastic, non-slip stool. Keep the floor dry. Clean up any water that spills on the floor as soon as it happens. Remove soap buildup in the tub or shower regularly. Attach bath mats securely with double-sided non-slip rug tape. Do not have throw rugs and other things on the floor that can make you trip. What can I do in the bedroom? Use night lights. Make sure that you have a light by your bed that is easy to reach. Do not use any sheets or blankets that are too big for  your bed. They should not hang down onto the floor. Have a firm chair that has side arms. You can use this for support while you get dressed. Do not have throw rugs and other things on the floor that can make you trip. What can I do in the kitchen? Clean up any spills right away. Avoid walking on wet floors. Keep items that you use a lot in easy-to-reach places. If you need to reach something above you, use a strong step stool that has a grab bar. Keep electrical cords out of the way. Do not use floor polish or wax that makes floors slippery. If you must use wax, use non-skid floor wax. Do not have throw rugs and other things on the floor that can make you trip. What can I do with my stairs? Do not leave any items on the stairs. Make sure that there are handrails on both sides of the stairs and use them. Fix handrails that are broken or loose. Make sure that handrails are as long as the stairways. Check any carpeting to make sure that it is firmly attached to the stairs. Fix any carpet that is loose or  worn. Avoid having throw rugs at the top or bottom of the stairs. If you do have throw rugs, attach them to the floor with carpet tape. Make sure that you have a light switch at the top of the stairs and the bottom of the stairs. If you do not have them, ask someone to add them for you. What else can I do to help prevent falls? Wear shoes that: Do not have high heels. Have rubber bottoms. Are comfortable and fit you well. Are closed at the toe. Do not wear sandals. If you use a stepladder: Make sure that it is fully opened. Do not climb a closed stepladder. Make sure that both sides of the stepladder are locked into place. Ask someone to hold it for you, if possible. Clearly mark and make sure that you can see: Any grab bars or handrails. First and last steps. Where the edge of each step is. Use tools that help you move around (mobility aids) if they are needed. These  include: Canes. Walkers. Scooters. Crutches. Turn on the lights when you go into a dark area. Replace any light bulbs as soon as they burn out. Set up your furniture so you have a clear path. Avoid moving your furniture around. If any of your floors are uneven, fix them. If there are any pets around you, be aware of where they are. Review your medicines with your doctor. Some medicines can make you feel dizzy. This can increase your chance of falling. Ask your doctor what other things that you can do to help prevent falls. This information is not intended to replace advice given to you by your health care provider. Make sure you discuss any questions you have with your health care provider. Document Released: 02/16/2009 Document Revised: 09/28/2015 Document Reviewed: 05/27/2014 Elsevier Interactive Patient Education  2017 [employer].

## 2023-06-30 DIAGNOSIS — E349 Endocrine disorder, unspecified: Secondary | ICD-10-CM | POA: Diagnosis not present

## 2023-06-30 DIAGNOSIS — Z8546 Personal history of malignant neoplasm of prostate: Secondary | ICD-10-CM | POA: Diagnosis not present

## 2023-06-30 DIAGNOSIS — N400 Enlarged prostate without lower urinary tract symptoms: Secondary | ICD-10-CM | POA: Diagnosis not present

## 2023-07-02 ENCOUNTER — Telehealth: Payer: Self-pay

## 2023-07-02 NOTE — Telephone Encounter (Signed)
 Called to check in with patient, who had LAAO on 05/30/2022. The patient reports doing well with no issues.  Scheduled the patient for follow-up with Dr. Jens Som 10/29/2023. The patient understands to call with questions or concerns.  He was grateful for call and agreed with plan.

## 2023-07-03 ENCOUNTER — Encounter: Payer: Self-pay | Admitting: Podiatry

## 2023-07-03 ENCOUNTER — Ambulatory Visit: Payer: Medicare PPO | Admitting: Podiatry

## 2023-07-03 DIAGNOSIS — M79674 Pain in right toe(s): Secondary | ICD-10-CM

## 2023-07-03 DIAGNOSIS — M79675 Pain in left toe(s): Secondary | ICD-10-CM

## 2023-07-03 DIAGNOSIS — B351 Tinea unguium: Secondary | ICD-10-CM | POA: Insufficient documentation

## 2023-07-03 NOTE — Progress Notes (Signed)
 This patient presents to the office with chief complaint of long thick painful nails.  Patient says the nails are painful walking and wearing shoes.  This patient is unable to self treat.  This patient is unable to trim his  nails since he is unable to reach his  nails. Patient says he injured his right big toe about three weeks ago but now the swelling and redness has diminished. he presents to the office for preventative foot care services.  General Appearance  Alert, conversant and in no acute stress.  Vascular  Dorsalis pedis and posterior tibial  pulses are palpable  bilaterally.  Capillary return is within normal limits  bilaterally. Temperature is within normal limits  bilaterally.  Neurologic  Senn-Weinstein monofilament wire test within normal limits  bilaterally. Muscle power within normal limits bilaterally.  Nails Thick disfigured discolored nails with subungual debris  from hallux to fifth toes bilaterally. No evidence of bacterial infection or drainage bilaterally.  Orthopedic  No limitations of motion  feet .  No crepitus or effusions noted.  No bony pathology or digital deformities noted. HAV  B/L.  Skin  normotropic skin with no porokeratosis noted bilaterally.  No signs of infections or ulcers noted.     Onychomycosis  Nails  B/L.  Pain in right toes  Pain in left toes  Debridement of nails both feet followed trimming the nails with dremel tool.    RTC 3 months.   Helane Gunther DPM

## 2023-07-07 ENCOUNTER — Other Ambulatory Visit: Payer: Self-pay | Admitting: Family Medicine

## 2023-07-07 DIAGNOSIS — E785 Hyperlipidemia, unspecified: Secondary | ICD-10-CM

## 2023-07-09 ENCOUNTER — Encounter (INDEPENDENT_AMBULATORY_CARE_PROVIDER_SITE_OTHER): Payer: Medicare PPO | Admitting: Ophthalmology

## 2023-07-09 DIAGNOSIS — H35033 Hypertensive retinopathy, bilateral: Secondary | ICD-10-CM | POA: Diagnosis not present

## 2023-07-09 DIAGNOSIS — I10 Essential (primary) hypertension: Secondary | ICD-10-CM

## 2023-07-09 DIAGNOSIS — H353132 Nonexudative age-related macular degeneration, bilateral, intermediate dry stage: Secondary | ICD-10-CM

## 2023-07-09 DIAGNOSIS — H43813 Vitreous degeneration, bilateral: Secondary | ICD-10-CM | POA: Diagnosis not present

## 2023-07-16 DIAGNOSIS — H353132 Nonexudative age-related macular degeneration, bilateral, intermediate dry stage: Secondary | ICD-10-CM | POA: Diagnosis not present

## 2023-07-20 ENCOUNTER — Other Ambulatory Visit: Payer: Self-pay | Admitting: Family Medicine

## 2023-07-20 DIAGNOSIS — I10 Essential (primary) hypertension: Secondary | ICD-10-CM

## 2023-07-20 DIAGNOSIS — E782 Mixed hyperlipidemia: Secondary | ICD-10-CM

## 2023-08-15 DIAGNOSIS — H353132 Nonexudative age-related macular degeneration, bilateral, intermediate dry stage: Secondary | ICD-10-CM | POA: Diagnosis not present

## 2023-08-19 DIAGNOSIS — M25552 Pain in left hip: Secondary | ICD-10-CM | POA: Diagnosis not present

## 2023-08-26 DIAGNOSIS — M25552 Pain in left hip: Secondary | ICD-10-CM | POA: Diagnosis not present

## 2023-08-28 DIAGNOSIS — M25552 Pain in left hip: Secondary | ICD-10-CM | POA: Diagnosis not present

## 2023-09-07 NOTE — Progress Notes (Signed)
 [facility] [address], [address] [place], [place] [address] [phone]  Date:  09/15/2023   Name:  Connor [name]   DOB:  08/03/1933   MRN:  [id number]  PCP:  Kaylee Partridge, MD    Chief Complaint: Follow-up (6 month)   History of Present Illness:  KYAL SABHARWAL [name] is a 89 y.o. very pleasant male patient who presents with the following:  Pt seen today for a periodic recheck Last seen by myself in December   History of atrial fibrillation, left ventricle dysfunction, prostate cancer, seronegative rheumatoid arthritis and osteoarthritis, sleep apnea.  Has some difficulty using his CPAP and typically uses it for just an hour or 2 nightly He does IM humira weekly for his RA- he continues to do this    He is taking aspirin , no other blood thinners at this time-he received a Watchman device due to history of hematuria and is no longer on anticoagulation  He is having pain with his left hip- injection 3 weeks ago,  it helped some but unfortunately benefit did not last.  They are thinking of maybe doing a hip replacement. He had the right done in the past and went well  He is not as active as he has been in the past- he may get tired out and does not do as much as he would like  No CP or SOB A fib has not bothered him in over a year   He and his wife still live alone in their own home.  Daivd Dub does suffer from some memory loss but overall does pretty well.  We discussed a strategy to agree and reassure regarding any argument about memory as opposed to trying to correct her  Covid booster-recommended RSV-recommended Shingrix done Pneumonia UTD  Patient Active Problem List   Diagnosis Date Noted   Pain due to onychomycosis of toenails of both feet 07/03/2023   Atrial fibrillation (HCC) 05/30/2022   Presence of Watchman left atrial appendage closure device 05/30/2022   Hematuria 01/04/2022   Gross hematuria  01/04/2022   Secondary hypercoagulable state (HCC) 03/28/2020   Malignant neoplasm of prostate (HCC) 09/11/2018   Enlarged prostate with urinary obstruction 07/06/2018   Gastrocnemius strain, right, initial encounter 12/10/2017   Baker's cyst of knee, right 12/10/2017   Obesity 03/24/2017   Paroxysmal atrial fibrillation (HCC) 12/05/2016   Near syncope 04/20/2016   Leg hematoma, left, initial encounter 04/20/2016   Bleeding    Ecchymosis    Left hamstring muscle strain    Muscle tear    RBBB 08/30/2015   Chronic systolic dysfunction of left ventricle 07/11/2014   OSA (obstructive sleep apnea) 04/07/2013   Multinodular goiter 01/20/2013   Cervical spondylosis without myelopathy 01/18/2013   Left ventricular dysfunction 12/17/2011   Low back pain 09/11/2011   Fatigue 05/22/2011   PVC (premature ventricular contraction) 01/18/2011   Persistent atrial fibrillation (HCC) 01/12/2011   Hyperlipidemia 08/02/2010   Hypertension 08/02/2010   Osteoarthritis 08/02/2010   BPH (benign prostatic hyperplasia) 08/02/2010   GERD (gastroesophageal reflux disease) 08/02/2010   h/o Atrial flutter (HCC) s/p ablation 2012     Past Medical History:  Diagnosis Date   Allergy 1985   Benign localized prostatic hyperplasia with lower urinary tract symptoms (LUTS)    urologist-- dr Joie Narrow   Cancer [facility]) 07/2018   prostate   Cataract 12/2012   CHF (congestive heart failure) (HCC) ?  Chronic anticoagulation    Xarelto  for afib   Chronic constipation    Chronic cystitis    Clotting disorder (HCC)    GERD (gastroesophageal reflux disease)    Gross hematuria    Heart murmur ?   History of DVT of lower extremity 06/08/2010   right femoral dvt dx post op total hip 05-08-2010   History of gastric ulcer    remote   History of kidney stones    one stone. remote hx   Hypercholesteremia    Hyperlipidemia ?   Hypertension    Dx by Dr. Chilton Couch around age  88    Macular degeneration    dry    NICM (nonischemic cardiomyopathy) (HCC)    per echo 06-11-2017,  ef 45-50% with diffuse hypokinesis,  G1DD   OSA on CPAP 2017   compliant with CPAP.  managed by Dr Villa Greaser.   Osteoarthritis    Other urethral stricture, male, meatal    chronic;  pt self dilates   Paroxysmal atrial fibrillation [facility]) cardiologist-- dr Nunzio Belch   first dx 2009/   a. documented by Dr Palmira Bock on office EKG 9/12. b. maintained on Tikosyn  and Xarelto . c. s/p DCCV 's.  d.  x3  EP w/ ablation atrial fib last one 12-05-2016   Prediabetes    Premature ventricular contraction    Presence of Watchman left atrial appendage closure device 05/30/2022   Watchman FLX 24mm by Dr. Marven Slimmer   PVC's (premature ventricular contractions)    RA (rheumatoid arthritis) [facility])    rheumatologist--- Dr Lum Salina,  treated with Humira   RBBB (right bundle branch block)    Hx of a fib, ablation Aug 2018   Rheumatoid arthritis (HCC)    Sleep apnea    Ulcer    Vitamin D  deficiency    Wears hearing aid in both ears     Past Surgical History:  Procedure Laterality Date   ABLATION OF DYSRHYTHMIC FOCUS  08/29/2015   ATRIAL FIBRILLATION ABLATION  12/05/2016   ATRIAL FIBRILLATION ABLATION N/A 12/05/2016   Procedure: Atrial Fibrillation Ablation;  Surgeon: Jolly Needle, MD;  Location: [facility] INVASIVE CV LAB;  Service: Cardiovascular;  Laterality: N/A;   ATRIAL FIBRILLATION ABLATION N/A 02/29/2020   Procedure: ATRIAL FIBRILLATION ABLATION;  Surgeon: Jolly Needle, MD;  Location: [facility] INVASIVE CV LAB;  Service: Cardiovascular;  Laterality: N/A;   ATRIAL FLUTTER ABLATION  09/2010   by Mare Shallow Right 1990s   CARDIOVERSION N/A 10/28/2012   Procedure: CARDIOVERSION;  Surgeon: Odie Benne, MD;  Location: [facility] OR;  Service: Cardiovascular;  Laterality: N/A;   CATARACT EXTRACTION W/ INTRAOCULAR LENS  IMPLANT, BILATERAL  2019   CYSTOSCOPY WITH BIOPSY N/A 09/03/2018   Procedure: CYSTOSCOPY WITH FULGURATION Fredrick Jenkins;  Surgeon: Trent Frizzle, MD;  Location: [facility] ORS;  Service: Urology;  Laterality: N/A;  30 MINS   CYSTOSCOPY WITH FULGERATION N/A 01/23/2022   Procedure: CYSTOSCOPY WITH FULGERATION OF PROSTATIC URETHRA;  Surgeon: Trent Frizzle, MD;  Location: [facility] ORS;  Service: Urology;  Laterality: N/A;  30 MINS   DENTAL SURGERY     ELECTROPHYSIOLOGIC STUDY N/A 08/29/2015   Procedure: Atrial Fibrillation Ablation;  Surgeon: Jolly Needle, MD;  Location: [facility] INVASIVE CV LAB;  Service: Cardiovascular;  Laterality: N/A;   EYE SURGERY     Cataracts   JOINT REPLACEMENT  1995, 1997   KNEE SURGERY Bilateral x3   1960s & 1970s   open   LEFT ATRIAL APPENDAGE OCCLUSION N/A 05/30/2022  Procedure: LEFT ATRIAL APPENDAGE OCCLUSION;  Surgeon: Boyce Byes, MD;  Location: [facility] INVASIVE CV LAB;  Service: Cardiovascular;  Laterality: N/A;   LEFT HEART CATHETERIZATION WITH CORONARY ANGIOGRAM N/A 06/07/2011   Procedure: LEFT HEART CATHETERIZATION WITH CORONARY ANGIOGRAM;  Surgeon: Peter M Swaziland, MD;  Location: [facility] CATH LAB;  Service: Cardiovascular;  Laterality: N/A;    Normal coronary arteries,  low normal LVF   TEE WITHOUT CARDIOVERSION N/A 05/30/2022   Procedure: TRANSESOPHAGEAL ECHOCARDIOGRAM (TEE);  Surgeon: Boyce Byes, MD;  Location: [facility] INVASIVE CV LAB;  Service: Cardiovascular;  Laterality: N/A;   TONSILLECTOMY AND ADENOIDECTOMY  age 32   TOTAL HIP ARTHROPLASTY Right 06/04/10    dr Jinger Mount  @[facility]    TOTAL KNEE ARTHROPLASTY Bilateral right 1995;  left 1997   TRANSURETHRAL RESECTION OF PROSTATE  06-29-2002   dr humphries  @[facility]    TRANSURETHRAL RESECTION OF PROSTATE N/A 07/06/2018   Procedure: TRANSURETHRAL RESECTION OF THE PROSTATE (TURP);  Surgeon: Trent Frizzle, MD;  [facility];  Service: Urology;  Laterality: N/A;  45 MINS    Social History   Tobacco Use   Smoking status: Former    Current packs/day: 0.00    Average packs/day: 1 pack/day for 30.0 years (30.0 ttl pk-yrs)    Types: Cigarettes     Start date: 05/06/1948    Quit date: 05/06/1978    Years since quitting: 45.3   Smokeless tobacco: Never   Tobacco comments:    Former smoker 03/14/2021  Vaping Use   Vaping status: Never Used  Substance Use Topics   Alcohol  use: Yes    Alcohol /week: 1.0 standard drink of alcohol     Types: 1 Glasses of wine per week    Comment: occasionally wine and beer   Drug use: Never    Family History  Problem Relation Age of Onset   Heart attack Father    Heart disease Father    Obesity Father    Lung disease Mother    Arthritis Mother    Arrhythmia Sister    Atrial fibrillation Brother    Diabetes Neg Hx     Allergies  Allergen Reactions   Penicillins Itching, Swelling and Other (See Comments)    Did it involve swelling of the face/tongue/throat, SOB, or low BP? No Did it involve sudden or severe rash/hives, skin peeling, or any reaction on the inside of your mouth or nose? No Did you need to seek medical attention at a hospital or doctor's office? No When did it last happen?      30 + years If all above answers are "NO", may proceed with cephalosporin use.      Medication list has been reviewed and updated.  Current Outpatient Medications on File Prior to Visit  Medication Sig Dispense Refill   atorvastatin  (LIPITOR) 10 MG tablet Take 1 tablet (10 mg total) by mouth daily. 90 tablet 1   acetaminophen  (TYLENOL ) 500 MG tablet Take 1,000 mg by mouth 2 (two) times daily as needed for moderate pain.     aspirin  EC 81 MG tablet Take 1 tablet (81 mg total) by mouth daily. Swallow whole.     Carboxymethylcellulose Sodium (THERATEARS OP) Apply 1 drop to eye daily as needed (dry eyes).     cephALEXin  (KEFLEX ) 500 MG capsule Take 2,000 mg by mouth as directed. Dental appointments (Patient not taking: Reported on 03/31/2023)     Cholecalciferol  (VITAMIN D3) 5000 UNITS TABS Take 5,000 Units by mouth every morning.  clindamycin (CLEOCIN T) 1 % external solution Apply 1 Application  topically daily.     Cranberry 1000 MG CAPS Take 4,000 mg by mouth 2 (two) times daily.     FLUZONE HIGH-DOSE 0.5 ML injection Inject 0.5 mLs into the muscle once.     HUMIRA PEN 40 MG/0.4ML PNKT Inject 40 mg as directed every 7 (seven) days. Every Tuesday  6   lisinopril  (ZESTRIL ) 40 MG tablet TAKE 1 TABLET BY MOUTH EVERY DAY 90 tablet 3   Melatonin 10 MG CHEW Chew 3 Pieces of gum by mouth at bedtime. gummy     metoprolol  succinate (TOPROL -XL) 50 MG 24 hr tablet Take 1 tablet (50 mg total) by mouth 2 (two) times daily. Take with or immediately following a meal. 180 tablet 3   Multiple Vitamins-Minerals (PRESERVISION AREDS 2) CAPS Take 1 capsule by mouth 2 (two) times a day.     pantoprazole  (PROTONIX ) 40 MG tablet TAKE 1 TABLET BY MOUTH EVERY DAY 90 tablet 3   senna (SENOKOT) 8.6 MG TABS tablet Take 2 tablets by mouth at bedtime. Per patient taking 2 tablets daily     solifenacin (VESICARE) 10 MG tablet Take 10 mg by mouth at bedtime.      SPIKEVAX syringe Inject 0.5 mLs into the muscle once.     tamsulosin  (FLOMAX ) 0.4 MG CAPS capsule Take 1 capsule (0.4 mg total) by mouth daily after supper. 30 capsule 5   Testosterone  20.25 MG/ACT (1.62%) GEL Apply 1 Pump topically at bedtime. Apply to shoulders     vitamin B-12 (CYANOCOBALAMIN ) 500 MCG tablet Take 500 mcg by mouth every evening.      No current facility-administered medications on file prior to visit.    Review of Systems:  As per HPI- otherwise negative.   Physical Examination: Vitals:   09/15/23 1017  BP: 120/74  Pulse: 88  Resp: 18  Temp: 98.2 F (36.8 C)  SpO2: 100%   Vitals:   09/15/23 1017  Weight: 213 lb 9.6 oz (96.9 kg)  Height: 6' (1.829 m)   Body mass index is 28.97 kg/m. Ideal Body Weight: Weight in (lb) to have BMI = 25: 183.9  GEN: no acute distress.  Looks well, tall build HEENT: Atraumatic, Normocephalic.  Hard of hearing, wearing hearing aids Ears and Nose: No external deformity. CV: RRR, No M/G/R.  No JVD. No thrill. No extra heart sounds. PULM: CTA B, no wheezes, crackles, rhonchi. No retractions. No resp. distress. No accessory muscle use. ABD: S, NT, ND EXTR: No c/c/e PSYCH: Normally interactive. Conversant.  Gets around fairly well for age but does favor his left hip  Assessment and Plan: Left hip pain - Plan: Ambulatory referral to Physical Therapy  Mixed hyperlipidemia - Plan: Lipid panel  Essential hypertension - Plan: Basic metabolic panel with GFR  Screening for diabetes mellitus - Plan: Hemoglobin A1c  Patient would like to try some physical therapy for his left hip prior to having surgery.  He already sees a physical therapist to recommended adding services for his hip.  I ordered this today  Follow-up on hyperlipidemia  Blood pressure well-controlled  Follow-up on A1c  Signed Gates Kasal, MD Received labs as below, message to patient Results for orders placed or performed in visit on 09/15/23  Hemoglobin A1c   Collection Time: 09/15/23 10:41 AM  Result Value Ref Range   Hgb A1c MFr Bld 6.5 4.6 - 6.5 %  Lipid panel   Collection Time: 09/15/23 10:41 AM  Result  Value Ref Range   Cholesterol 144 0 - 200 mg/dL   Triglycerides 045.4 0.0 - 149.0 mg/dL   HDL 09.81 >19.14 mg/dL   VLDL 78.2 0.0 - 95.6 mg/dL   LDL Cholesterol 76 0 - 99 mg/dL   Total CHOL/HDL Ratio 3    NonHDL 97.94   Basic metabolic panel with GFR   Collection Time: 09/15/23 10:41 AM  Result Value Ref Range   Sodium 142 135 - 145 mEq/L   Potassium 4.1 3.5 - 5.1 mEq/L   Chloride 104 96 - 112 mEq/L   CO2 29 19 - 32 mEq/L   Glucose, Bld 95 70 - 99 mg/dL   BUN 20 6 - 23 mg/dL   Creatinine, Ser 2.13 0.40 - 1.50 mg/dL   GFR 08.65 >78.46 mL/min   Calcium  8.9 8.4 - 10.5 mg/dL   *Note: Due to a large number of results and/or encounters for the requested time period, some results have not been displayed. A complete set of results can be found in Results Review.

## 2023-09-14 DIAGNOSIS — H353132 Nonexudative age-related macular degeneration, bilateral, intermediate dry stage: Secondary | ICD-10-CM | POA: Diagnosis not present

## 2023-09-15 ENCOUNTER — Encounter: Payer: Self-pay | Admitting: Family Medicine

## 2023-09-15 ENCOUNTER — Ambulatory Visit: Payer: Medicare PPO | Admitting: Family Medicine

## 2023-09-15 VITALS — BP 120/74 | HR 88 | Temp 98.2°F | Resp 18 | Ht 72.0 in | Wt 213.6 lb

## 2023-09-15 DIAGNOSIS — E782 Mixed hyperlipidemia: Secondary | ICD-10-CM | POA: Diagnosis not present

## 2023-09-15 DIAGNOSIS — Z131 Encounter for screening for diabetes mellitus: Secondary | ICD-10-CM | POA: Diagnosis not present

## 2023-09-15 DIAGNOSIS — I10 Essential (primary) hypertension: Secondary | ICD-10-CM

## 2023-09-15 DIAGNOSIS — M25552 Pain in left hip: Secondary | ICD-10-CM | POA: Diagnosis not present

## 2023-09-15 LAB — LIPID PANEL
Cholesterol: 144 mg/dL (ref 0–200)
HDL: 46.3 mg/dL (ref >39.00)
LDL Cholesterol: 76 mg/dL (ref 0–99)
NonHDL: 97.94
Total CHOL/HDL Ratio: 3
Triglycerides: 108 mg/dL (ref 0.0–149.0)
VLDL: 21.6 mg/dL (ref 0.0–40.0)

## 2023-09-15 LAB — BASIC METABOLIC PANEL WITH GFR
BUN: 20 mg/dL (ref 6–23)
CO2: 29 meq/L (ref 19–32)
Calcium: 8.9 mg/dL (ref 8.4–10.5)
Chloride: 104 meq/L (ref 96–112)
Creatinine, Ser: 0.83 mg/dL (ref 0.40–1.50)
GFR: 78.68 mL/min (ref >60.00)
Glucose, Bld: 95 mg/dL (ref 70–99)
Potassium: 4.1 meq/L (ref 3.5–5.1)
Sodium: 142 meq/L (ref 135–145)

## 2023-09-15 LAB — HEMOGLOBIN A1C: Hgb A1c MFr Bld: 6.5 % (ref 4.6–6.5)

## 2023-09-15 NOTE — Patient Instructions (Addendum)
 If not done already I would recommend a dose of RSV vaccine at your pharmacy  Also recommend a covid booster if not done in the last 6 months or so   I will be in touch with your labs asap

## 2023-09-16 ENCOUNTER — Ambulatory Visit: Attending: Family Medicine | Admitting: Physical Therapy

## 2023-09-16 ENCOUNTER — Encounter: Payer: Self-pay | Admitting: Physical Therapy

## 2023-09-16 DIAGNOSIS — M6281 Muscle weakness (generalized): Secondary | ICD-10-CM | POA: Insufficient documentation

## 2023-09-16 DIAGNOSIS — R262 Difficulty in walking, not elsewhere classified: Secondary | ICD-10-CM | POA: Diagnosis not present

## 2023-09-16 DIAGNOSIS — R2681 Unsteadiness on feet: Secondary | ICD-10-CM | POA: Diagnosis not present

## 2023-09-16 DIAGNOSIS — M25552 Pain in left hip: Secondary | ICD-10-CM | POA: Insufficient documentation

## 2023-09-16 NOTE — Therapy (Signed)
 OUTPATIENT PHYSICAL THERAPY LOWER EXTREMITY EVALUATION   Patient Name: Connor [name] MRN: [id number] DOB:Jun 21, 1933, 89 y.o., male Today's Date: 09/16/2023  END OF SESSION:  PT End of Session - 09/16/23 1614     Visit Number 1    Number of Visits 13    Date for PT Re-Evaluation 12/17/23    Authorization Type [employer]    PT Start Time 1613    PT Stop Time 1658    PT Time Calculation (min) 45 min    Activity Tolerance Patient tolerated treatment well    Behavior During Therapy [facility] for tasks assessed/performed             Past Medical History:  Diagnosis Date   Allergy 1985   Benign localized prostatic hyperplasia with lower urinary tract symptoms (LUTS)    urologist-- dr Joie Narrow   Cancer [facility]) 07/2018   prostate   Cataract 12/2012   CHF (congestive heart failure) (HCC) ?   Chronic anticoagulation    Xarelto  for afib   Chronic constipation    Chronic cystitis    Clotting disorder (HCC)    GERD (gastroesophageal reflux disease)    Gross hematuria    Heart murmur ?   History of DVT of lower extremity 06/08/2010   right femoral dvt dx post op total hip 05-08-2010   History of gastric ulcer    remote   History of kidney stones    one stone. remote hx   Hypercholesteremia    Hyperlipidemia ?   Hypertension    Dx by Dr. Chilton Couch around age  10    Macular degeneration    dry   NICM (nonischemic cardiomyopathy) (HCC)    per echo 06-11-2017,  ef 45-50% with diffuse hypokinesis,  G1DD   OSA on CPAP 2017   compliant with CPAP.  managed by Dr Villa Greaser.   Osteoarthritis    Other urethral stricture, male, meatal    chronic;  pt self dilates   Paroxysmal atrial fibrillation [facility]) cardiologist-- dr Nunzio Belch   first dx 2009/   a. documented by Dr Palmira Bock on office EKG 9/12. b. maintained on Tikosyn  and Xarelto . c. s/p DCCV 's.  d.  x3  EP w/ ablation atrial fib last one 12-05-2016   Prediabetes    Premature ventricular contraction    Presence of Watchman left atrial  appendage closure device 05/30/2022   Watchman FLX 24mm by Dr. Marven Slimmer   PVC's (premature ventricular contractions)    RA (rheumatoid arthritis) [facility])    rheumatologist--- Dr Lum Salina,  treated with Humira   RBBB (right bundle branch block)    Hx of a fib, ablation Aug 2018   Rheumatoid arthritis (HCC)    Sleep apnea    Ulcer    Vitamin D  deficiency    Wears hearing aid in both ears    Past Surgical History:  Procedure Laterality Date   ABLATION OF DYSRHYTHMIC FOCUS  08/29/2015   ATRIAL FIBRILLATION ABLATION  12/05/2016   ATRIAL FIBRILLATION ABLATION N/A 12/05/2016   Procedure: Atrial Fibrillation Ablation;  Surgeon: Jolly Needle, MD;  Location: [facility] INVASIVE CV LAB;  Service: Cardiovascular;  Laterality: N/A;   ATRIAL FIBRILLATION ABLATION N/A 02/29/2020   Procedure: ATRIAL FIBRILLATION ABLATION;  Surgeon: Jolly Needle, MD;  Location: [facility] INVASIVE CV LAB;  Service: Cardiovascular;  Laterality: N/A;   ATRIAL FLUTTER ABLATION  09/2010   by Mare Shallow Right 1990s   CARDIOVERSION N/A 10/28/2012   Procedure: CARDIOVERSION;  Surgeon: Coy Ditty  Abel Hoe, MD;  Location: [facility] OR;  Service: Cardiovascular;  Laterality: N/A;   CATARACT EXTRACTION W/ INTRAOCULAR LENS  IMPLANT, BILATERAL  2019   CYSTOSCOPY WITH BIOPSY N/A 09/03/2018   Procedure: CYSTOSCOPY WITH FULGURATION Fredrick Jenkins;  Surgeon: Trent Frizzle, MD;  Location: [facility] ORS;  Service: Urology;  Laterality: N/A;  30 MINS   CYSTOSCOPY WITH FULGERATION N/A 01/23/2022   Procedure: CYSTOSCOPY WITH FULGERATION OF PROSTATIC URETHRA;  Surgeon: Trent Frizzle, MD;  Location: [facility] ORS;  Service: Urology;  Laterality: N/A;  30 MINS   DENTAL SURGERY     ELECTROPHYSIOLOGIC STUDY N/A 08/29/2015   Procedure: Atrial Fibrillation Ablation;  Surgeon: Jolly Needle, MD;  Location: [facility] INVASIVE CV LAB;  Service: Cardiovascular;  Laterality: N/A;   EYE SURGERY     Cataracts   JOINT REPLACEMENT  1995, 1997   KNEE SURGERY Bilateral x3   1960s  & 1970s   open   LEFT ATRIAL APPENDAGE OCCLUSION N/A 05/30/2022   Procedure: LEFT ATRIAL APPENDAGE OCCLUSION;  Surgeon: Boyce Byes, MD;  Location: [facility] INVASIVE CV LAB;  Service: Cardiovascular;  Laterality: N/A;   LEFT HEART CATHETERIZATION WITH CORONARY ANGIOGRAM N/A 06/07/2011   Procedure: LEFT HEART CATHETERIZATION WITH CORONARY ANGIOGRAM;  Surgeon: Peter M Swaziland, MD;  Location: [facility] CATH LAB;  Service: Cardiovascular;  Laterality: N/A;    Normal coronary arteries,  low normal LVF   TEE WITHOUT CARDIOVERSION N/A 05/30/2022   Procedure: TRANSESOPHAGEAL ECHOCARDIOGRAM (TEE);  Surgeon: Boyce Byes, MD;  Location: [facility] INVASIVE CV LAB;  Service: Cardiovascular;  Laterality: N/A;   TONSILLECTOMY AND ADENOIDECTOMY  age 74   TOTAL HIP ARTHROPLASTY Right 06/04/10    dr Jinger Mount  @[facility]    TOTAL KNEE ARTHROPLASTY Bilateral right 1995;  left 1997   TRANSURETHRAL RESECTION OF PROSTATE  06-29-2002   dr humphries  @[facility]    TRANSURETHRAL RESECTION OF PROSTATE N/A 07/06/2018   Procedure: TRANSURETHRAL RESECTION OF THE PROSTATE (TURP);  Surgeon: Trent Frizzle, MD;  Location: [facility];  Service: Urology;  Laterality: N/A;  45 MINS   Patient Active Problem List   Diagnosis Date Noted   Pain due to onychomycosis of toenails of both feet 07/03/2023   Atrial fibrillation (HCC) 05/30/2022   Presence of Watchman left atrial appendage closure device 05/30/2022   Hematuria 01/04/2022   Gross hematuria 01/04/2022   Secondary hypercoagulable state (HCC) 03/28/2020   Malignant neoplasm of prostate (HCC) 09/11/2018   Enlarged prostate with urinary obstruction 07/06/2018   Gastrocnemius strain, right, initial encounter 12/10/2017   Baker's cyst of knee, right 12/10/2017   Obesity 03/24/2017   Paroxysmal atrial fibrillation (HCC) 12/05/2016   Near syncope 04/20/2016   Leg hematoma, left, initial encounter 04/20/2016   Bleeding    Ecchymosis    Left hamstring muscle strain    Muscle  tear    RBBB 08/30/2015   Chronic systolic dysfunction of left ventricle 07/11/2014   OSA (obstructive sleep apnea) 04/07/2013   Multinodular goiter 01/20/2013   Cervical spondylosis without myelopathy 01/18/2013   Left ventricular dysfunction 12/17/2011   Low back pain 09/11/2011   Fatigue 05/22/2011   PVC (premature ventricular contraction) 01/18/2011   Persistent atrial fibrillation (HCC) 01/12/2011   Hyperlipidemia 08/02/2010   Hypertension 08/02/2010   Osteoarthritis 08/02/2010   BPH (benign prostatic hyperplasia) 08/02/2010   GERD (gastroesophageal reflux disease) 08/02/2010   h/o Atrial flutter (HCC) s/p ablation 2012     PCP: Copland, MD  REFERRING PROVIDER: Copland, MD  REFERRING DIAG: left hip pain  THERAPY  DIAG:  Muscle weakness (generalized)  Difficulty in walking, not elsewhere classified  Unsteadiness on feet  Pain in left hip  Rationale for Evaluation and Treatment: Rehabilitation  ONSET DATE: 09/15/23  SUBJECTIVE:   SUBJECTIVE STATEMENT: Patient reports that about a month ago he was doing the recumbent bike like he normally does, reports that the next day had some left hip pain.  He reports that it has gotten progressively worse where he was having a lot of difficulty, reports a recent x-ray showed no bone on bone, some arthritis, has had to start using a SPC.  Had a cortisone injection in the left hip 3 weeks ago and it has helped the sharp pain.  PERTINENT HISTORY: Bilateral TKA, right THA, A-fib, Watchman placed PAIN:  Are you having pain? Yes: NPRS scale: 0/10 Pain location: anterior left hip and GT area Pain description: sharp, throbbing Aggravating factors: walking pain up to 8/10 Relieving factors: rest, sit down pain can be o/10  PRECAUTIONS: None  RED FLAGS: None   WEIGHT BEARING RESTRICTIONS: No  FALLS:  Has patient fallen in last 6 months? No  LIVING ENVIRONMENT: Lives with: lives with their family Lives in:  House/apartment Stairs: Yes: Internal: 12 steps; can reach both Has following equipment at home: Single point cane and    OCCUPATION: retired  PLOF: Independent and some yardwork, was doing some gym activities, does go to [employer] often  PATIENT GOALS: less pain, walk better, better strength  NEXT MD VISIT: none scheduled  OBJECTIVE:  Note: Objective measures were completed at Evaluation unless otherwise noted.  DIAGNOSTIC FINDINGS: reports to me that the x-rays looked good  PATIENT SURVEYS:  LEFS 16.3%  COGNITION: Overall cognitive status: Within functional limits for tasks assessed     SENSATION: WFL  MUSCLE LENGTH: Mild tightness in the HS, tight calves  POSTURE: rounded shoulders, forward head, and decreased lumbar lordosis  PALPATION: Tender in the left GT area  LOWER EXTREMITY ROM:  Active ROM Right eval Left eval  Hip flexion  60  Hip extension  5  Hip abduction  11  Hip adduction    Hip internal rotation    Hip external rotation  5  Knee flexion    Knee extension    Ankle dorsiflexion    Ankle plantarflexion    Ankle inversion    Ankle eversion     (Blank rows = not tested) Cannot cross the left leg over the right, has to use hands and is painful  LOWER EXTREMITY MMT:  MMT Right eval Left eval  Hip flexion  3+  Hip extension  4-  Hip abduction  4-  Hip adduction  4  Hip internal rotation    Hip external rotation    Knee flexion    Knee extension    Ankle dorsiflexion    Ankle plantarflexion    Ankle inversion    Ankle eversion     (Blank rows = not tested)  LOWER EXTREMITY SPECIAL TESTS:  Hip special tests: Trendelenburg test: positive , Hip scouring test: negative, and Piriformis test: positive   FUNCTIONAL TESTS:  5 times sit to stand: 60 seconds, has to use hands Timed up and go (TUG): 21 seconds 3 minute walk test: unable to go past 1 minute 80 feet, pain   GAIT: Distance walked: 80 feet Assistive device utilized: Single  point cane Level of assistance: Modified independence Comments: slow, very difficult with getting up, antalgic on the left  TREATMENT DATE:  09/16/23 Evaluation    PATIENT EDUCATION:  Education details: POC/HEP Person educated: Patient Education method: Programmer, multimedia, Facilities manager, Verbal cues, and Handouts Education comprehension: verbalized understanding  HOME EXERCISE PROGRAM: Access Code: [email or web address] URL: [email or web address] Date: 09/16/2023 Prepared by: Cherylene Corrente  Exercises - Supine Piriformis Stretch Pulling Heel to Hip  - 2 x daily - 7 x weekly - 1 sets - 5 reps - 15 hold - Supine Hip Adductor Stretch  - 2 x daily - 7 x weekly - 1 sets - 5 reps - 15 hold - Modified Thomas Stretch  - 2 x daily - 7 x weekly - 1 sets - 5 reps - 15 hold  ASSESSMENT:  CLINICAL IMPRESSION: Patient is a 89 y.o. male who was seen today for physical therapy evaluation and treatment for left hip pain he is really struggling with getting up from sitting and walking > 80 feet due to pain and fatigue in the left hip.  Very tender in the left GT area, also c/o a pinch in the left groin.   OBJECTIVE IMPAIRMENTS: Abnormal gait, cardiopulmonary status limiting activity, decreased activity tolerance, decreased balance, decreased coordination, decreased endurance, decreased mobility, difficulty walking, decreased ROM, decreased strength, increased muscle spasms, impaired flexibility, improper body mechanics, postural dysfunction, and pain.   REHAB POTENTIAL: Good  CLINICAL DECISION MAKING: Stable/uncomplicated  EVALUATION COMPLEXITY: Low   GOALS: Goals reviewed with patient? Yes  SHORT TERM GOALS: Target date: 10/08/23 Independent with initial HEP Baseline: Goal status: INITIAL  LONG TERM GOALS: Target date: 12/17/23  Independent with advanced  HEP Baseline:  Goal status: INITIAL  2.  Walk most community distances without AD Baseline:  Goal status: INITIAL  3.  Decrease pain 50% Baseline:  Goal status: INITIAL  4.  Decrease TUG to 13 seconds Baseline:  Goal status: INITIAL  5.  Decrease 5XSTS to 32 seconds Baseline:  Goal status: INITIAL  6.  Walk 400 feet in 3 minutes Baseline:  Goal status: INITIAL   PLAN:  PT FREQUENCY: 1-2x/week  PT DURATION: 12 weeks  PLANNED INTERVENTIONS: 97164- PT Re-evaluation, 97110-Therapeutic exercises, 97530- Therapeutic activity, 97112- Neuromuscular re-education, 97535- Self Care, 16109- Manual therapy, [phone]- Gait training, [phone]- Ionotophoresis 4mg /ml Dexamethasone , Patient/Family education, Balance training, Stair training, Taping, Dry Needling, Joint mobilization, Cryotherapy, and Moist heat  PLAN FOR NEXT SESSION: ROM, gait, could try [employer], PT 09/16/2023, 4:15 PM

## 2023-09-25 ENCOUNTER — Ambulatory Visit: Payer: Medicare PPO | Admitting: Podiatry

## 2023-10-02 ENCOUNTER — Ambulatory Visit: Payer: Medicare PPO | Admitting: Podiatry

## 2023-10-02 ENCOUNTER — Encounter: Payer: Self-pay | Admitting: Podiatry

## 2023-10-02 DIAGNOSIS — M79674 Pain in right toe(s): Secondary | ICD-10-CM

## 2023-10-02 DIAGNOSIS — B351 Tinea unguium: Secondary | ICD-10-CM

## 2023-10-02 DIAGNOSIS — M79675 Pain in left toe(s): Secondary | ICD-10-CM

## 2023-10-02 NOTE — Progress Notes (Signed)
 This patient presents to the office with chief complaint of long thick painful nails.  Patient says the nails are painful walking and wearing shoes.  This patient is unable to self treat.  This patient is unable to trim his  nails since he is unable to reach his  nails. Patient says he injured his right big toe about three weeks ago but now the swelling and redness has diminished. he presents to the office for preventative foot care services.  General Appearance  Alert, conversant and in no acute stress.  Vascular  Dorsalis pedis and posterior tibial  pulses are palpable  bilaterally.  Capillary return is within normal limits  bilaterally. Temperature is within normal limits  bilaterally.  Neurologic  Senn-Weinstein monofilament wire test within normal limits  bilaterally. Muscle power within normal limits bilaterally.  Nails Thick disfigured discolored nails with subungual debris  from hallux to fifth toes bilaterally. No evidence of bacterial infection or drainage bilaterally.  Orthopedic  No limitations of motion  feet .  No crepitus or effusions noted.  No bony pathology or digital deformities noted. HAV  B/L.  Skin  normotropic skin with no porokeratosis noted bilaterally.  No signs of infections or ulcers noted.     Onychomycosis  Nails  B/L.  Pain in right toes  Pain in left toes  Debridement of nails both feet followed trimming the nails with dremel tool.    RTC 3 months.   Helane Gunther DPM

## 2023-10-06 ENCOUNTER — Ambulatory Visit: Attending: Family Medicine | Admitting: Physical Therapy

## 2023-10-06 ENCOUNTER — Encounter: Payer: Self-pay | Admitting: Physical Therapy

## 2023-10-06 DIAGNOSIS — M6281 Muscle weakness (generalized): Secondary | ICD-10-CM | POA: Insufficient documentation

## 2023-10-06 DIAGNOSIS — R2681 Unsteadiness on feet: Secondary | ICD-10-CM | POA: Insufficient documentation

## 2023-10-06 DIAGNOSIS — R262 Difficulty in walking, not elsewhere classified: Secondary | ICD-10-CM | POA: Diagnosis not present

## 2023-10-06 DIAGNOSIS — M25552 Pain in left hip: Secondary | ICD-10-CM | POA: Diagnosis not present

## 2023-10-06 DIAGNOSIS — R252 Cramp and spasm: Secondary | ICD-10-CM | POA: Insufficient documentation

## 2023-10-06 NOTE — Therapy (Signed)
 OUTPATIENT PHYSICAL THERAPY LOWER EXTREMITY EVALUATION   Patient Name: Connor [name] MRN: [id number] DOB:10/17/1933, 89 y.o., male Today's Date: 10/06/2023  END OF SESSION:  PT End of Session - 10/06/23 1021     Visit Number 2    Number of Visits 13    Date for PT Re-Evaluation 12/17/23    Authorization Type [employer]    PT Start Time 1015    PT Stop Time 1100    PT Time Calculation (min) 45 min    Activity Tolerance Patient tolerated treatment well    Behavior During Therapy [facility] for tasks assessed/performed             Past Medical History:  Diagnosis Date   Allergy 1985   Benign localized prostatic hyperplasia with lower urinary tract symptoms (LUTS)    urologist-- dr Joie Narrow   Cancer [facility]) 07/2018   prostate   Cataract 12/2012   CHF (congestive heart failure) (HCC) ?   Chronic anticoagulation    Xarelto  for afib   Chronic constipation    Chronic cystitis    Clotting disorder (HCC)    GERD (gastroesophageal reflux disease)    Gross hematuria    Heart murmur ?   History of DVT of lower extremity 06/08/2010   right femoral dvt dx post op total hip 05-08-2010   History of gastric ulcer    remote   History of kidney stones    one stone. remote hx   Hypercholesteremia    Hyperlipidemia ?   Hypertension    Dx by Dr. Chilton Couch around age  37    Macular degeneration    dry   NICM (nonischemic cardiomyopathy) (HCC)    per echo 06-11-2017,  ef 45-50% with diffuse hypokinesis,  G1DD   OSA on CPAP 2017   compliant with CPAP.  managed by Dr Villa Greaser.   Osteoarthritis    Other urethral stricture, male, meatal    chronic;  pt self dilates   Paroxysmal atrial fibrillation [facility]) cardiologist-- dr Nunzio Belch   first dx 2009/   a. documented by Dr Palmira Bock on office EKG 9/12. b. maintained on Tikosyn  and Xarelto . c. s/p DCCV 's.  d.  x3  EP w/ ablation atrial fib last one 12-05-2016   Prediabetes    Premature ventricular contraction    Presence of Watchman left atrial  appendage closure device 05/30/2022   Watchman FLX 24mm by Dr. Marven Slimmer   PVC's (premature ventricular contractions)    RA (rheumatoid arthritis) [facility]    rheumatologist--- Dr Lum Salina,  treated with Humira   RBBB (right bundle branch block)    Hx of a fib, ablation Aug 2018   Rheumatoid arthritis (HCC)    Sleep apnea    Ulcer    Vitamin D  deficiency    Wears hearing aid in both ears    Past Surgical History:  Procedure Laterality Date   ABLATION OF DYSRHYTHMIC FOCUS  08/29/2015   ATRIAL FIBRILLATION ABLATION  12/05/2016   ATRIAL FIBRILLATION ABLATION N/A 12/05/2016   Procedure: Atrial Fibrillation Ablation;  Surgeon: Jolly Needle, MD;  Location: [facility] INVASIVE CV LAB;  Service: Cardiovascular;  Laterality: N/A;   ATRIAL FIBRILLATION ABLATION N/A 02/29/2020   Procedure: ATRIAL FIBRILLATION ABLATION;  Surgeon: Jolly Needle, MD;  Location: [facility] INVASIVE CV LAB;  Service: Cardiovascular;  Laterality: N/A;   ATRIAL FLUTTER ABLATION  09/2010   by Mare Shallow Right 1990s   CARDIOVERSION N/A 10/28/2012   Procedure: CARDIOVERSION;  Surgeon: Coy Ditty  Abel Hoe, MD;  Location: [facility] OR;  Service: Cardiovascular;  Laterality: N/A;   CATARACT EXTRACTION W/ INTRAOCULAR LENS  IMPLANT, BILATERAL  2019   CYSTOSCOPY WITH BIOPSY N/A 09/03/2018   Procedure: CYSTOSCOPY WITH FULGURATION Fredrick Jenkins;  Surgeon: Trent Frizzle, MD;  Location: [facility] ORS;  Service: Urology;  Laterality: N/A;  30 MINS   CYSTOSCOPY WITH FULGERATION N/A 01/23/2022   Procedure: CYSTOSCOPY WITH FULGERATION OF PROSTATIC URETHRA;  Surgeon: Trent Frizzle, MD;  Location: [facility] ORS;  Service: Urology;  Laterality: N/A;  30 MINS   DENTAL SURGERY     ELECTROPHYSIOLOGIC STUDY N/A 08/29/2015   Procedure: Atrial Fibrillation Ablation;  Surgeon: Jolly Needle, MD;  Location: [facility] INVASIVE CV LAB;  Service: Cardiovascular;  Laterality: N/A;   EYE SURGERY     Cataracts   JOINT REPLACEMENT  1995, 1997   KNEE SURGERY Bilateral x3   1960s  & 1970s   open   LEFT ATRIAL APPENDAGE OCCLUSION N/A 05/30/2022   Procedure: LEFT ATRIAL APPENDAGE OCCLUSION;  Surgeon: Boyce Byes, MD;  Location: [facility] INVASIVE CV LAB;  Service: Cardiovascular;  Laterality: N/A;   LEFT HEART CATHETERIZATION WITH CORONARY ANGIOGRAM N/A 06/07/2011   Procedure: LEFT HEART CATHETERIZATION WITH CORONARY ANGIOGRAM;  Surgeon: Peter M Swaziland, MD;  Location: [facility] CATH LAB;  Service: Cardiovascular;  Laterality: N/A;    Normal coronary arteries,  low normal LVF   TEE WITHOUT CARDIOVERSION N/A 05/30/2022   Procedure: TRANSESOPHAGEAL ECHOCARDIOGRAM (TEE);  Surgeon: Boyce Byes, MD;  Location: [facility] INVASIVE CV LAB;  Service: Cardiovascular;  Laterality: N/A;   TONSILLECTOMY AND ADENOIDECTOMY  age 27   TOTAL HIP ARTHROPLASTY Right 06/04/10    dr Jinger Mount  @[facility]    TOTAL KNEE ARTHROPLASTY Bilateral right 1995;  left 1997   TRANSURETHRAL RESECTION OF PROSTATE  06-29-2002   dr humphries  @[facility]    TRANSURETHRAL RESECTION OF PROSTATE N/A 07/06/2018   Procedure: TRANSURETHRAL RESECTION OF THE PROSTATE (TURP);  Surgeon: Trent Frizzle, MD;  Location: [facility];  Service: Urology;  Laterality: N/A;  45 MINS   Patient Active Problem List   Diagnosis Date Noted   Pain due to onychomycosis of toenails of both feet 07/03/2023   Atrial fibrillation (HCC) 05/30/2022   Presence of Watchman left atrial appendage closure device 05/30/2022   Hematuria 01/04/2022   Gross hematuria 01/04/2022   Secondary hypercoagulable state (HCC) 03/28/2020   Malignant neoplasm of prostate (HCC) 09/11/2018   Enlarged prostate with urinary obstruction 07/06/2018   Gastrocnemius strain, right, initial encounter 12/10/2017   Baker's cyst of knee, right 12/10/2017   Obesity 03/24/2017   Paroxysmal atrial fibrillation (HCC) 12/05/2016   Near syncope 04/20/2016   Leg hematoma, left, initial encounter 04/20/2016   Bleeding    Ecchymosis    Left hamstring muscle strain    Muscle  tear    RBBB 08/30/2015   Chronic systolic dysfunction of left ventricle 07/11/2014   OSA (obstructive sleep apnea) 04/07/2013   Multinodular goiter 01/20/2013   Cervical spondylosis without myelopathy 01/18/2013   Left ventricular dysfunction 12/17/2011   Low back pain 09/11/2011   Fatigue 05/22/2011   PVC (premature ventricular contraction) 01/18/2011   Persistent atrial fibrillation (HCC) 01/12/2011   Hyperlipidemia 08/02/2010   Hypertension 08/02/2010   Osteoarthritis 08/02/2010   BPH (benign prostatic hyperplasia) 08/02/2010   GERD (gastroesophageal reflux disease) 08/02/2010   h/o Atrial flutter (HCC) s/p ablation 2012     PCP: Copland, MD  REFERRING PROVIDER: Copland, MD  REFERRING DIAG: left hip pain  THERAPY  DIAG:  Pain in left hip  Muscle weakness (generalized)  Difficulty in walking, not elsewhere classified  Unsteadiness on feet  Cramp and spasm  Rationale for Evaluation and Treatment: Rehabilitation  ONSET DATE: 09/15/23  SUBJECTIVE:   SUBJECTIVE STATEMENT: Patient reports that he was having some pain with the exercises so backed off some, feels like it is mms and has difficulty getting in and out of the car  Patient reports that about a month ago he was doing the recumbent bike like he normally does, reports that the next day had some left hip pain.  He reports that it has gotten progressively worse where he was having a lot of difficulty, reports a recent x-ray showed no bone on bone, some arthritis, has had to start using a SPC.  Had a cortisone injection in the left hip 3 weeks ago and it has helped the sharp pain.  PERTINENT HISTORY: Bilateral TKA, right THA, A-fib, Watchman placed PAIN:  Are you having pain? Yes: NPRS scale: 0/10 Pain location: anterior left hip and GT area Pain description: sharp, throbbing Aggravating factors: walking pain up to 8/10 Relieving factors: rest, sit down pain can be o/10  PRECAUTIONS: None  RED  FLAGS: None   WEIGHT BEARING RESTRICTIONS: No  FALLS:  Has patient fallen in last 6 months? No  LIVING ENVIRONMENT: Lives with: lives with their family Lives in: House/apartment Stairs: Yes: Internal: 12 steps; can reach both Has following equipment at home: Single point cane and    OCCUPATION: retired  PLOF: Independent and some yardwork, was doing some gym activities, does go to [employer] often  PATIENT GOALS: less pain, walk better, better strength  NEXT MD VISIT: none scheduled  OBJECTIVE:  Note: Objective measures were completed at Evaluation unless otherwise noted.  DIAGNOSTIC FINDINGS: reports to me that the x-rays looked good  PATIENT SURVEYS:  LEFS 16.3%  COGNITION: Overall cognitive status: Within functional limits for tasks assessed     SENSATION: WFL  MUSCLE LENGTH: Mild tightness in the HS, tight calves  POSTURE: rounded shoulders, forward head, and decreased lumbar lordosis  PALPATION: Tender in the left GT area  LOWER EXTREMITY ROM:  Active ROM Right eval Left eval  Hip flexion  60  Hip extension  5  Hip abduction  11  Hip adduction    Hip internal rotation    Hip external rotation  5  Knee flexion    Knee extension    Ankle dorsiflexion    Ankle plantarflexion    Ankle inversion    Ankle eversion     (Blank rows = not tested) Cannot cross the left leg over the right, has to use hands and is painful  LOWER EXTREMITY MMT:  MMT Right eval Left eval  Hip flexion  3+  Hip extension  4-  Hip abduction  4-  Hip adduction  4  Hip internal rotation    Hip external rotation    Knee flexion    Knee extension    Ankle dorsiflexion    Ankle plantarflexion    Ankle inversion    Ankle eversion     (Blank rows = not tested)  LOWER EXTREMITY SPECIAL TESTS:  Hip special tests: Trendelenburg test: positive , Hip scouring test: negative, and Piriformis test: positive   FUNCTIONAL TESTS:  5 times sit to stand: 60 seconds, has to use  hands Timed up and go (TUG): 21 seconds 3 minute walk test: unable to go past 1 minute 80 feet, pain  GAIT: Distance walked: 80 feet Assistive device utilized: Single point cane Level of assistance: Modified independence Comments: slow, very difficult with getting up, antalgic on the left                                                                                                                                TREATMENT DATE:  10/06/23 UBE level 4 x 6 minutes Nustep level 5 x 6 minutes 2.5# standing marches, hip abduction and extension On airex cone toe touch On airex ball toss 10# straight arm pulls Feet on ball K2C, rotation, bridges, isometric ab Ball b/n knees squeeze Blue tband clamshells STM with the T-gun to the left hip flexor, quad, adductor and glute  09/16/23 Evaluation    PATIENT EDUCATION:  Education details: POC/HEP Person educated: Patient Education method: Programmer, multimedia, Facilities manager, Verbal cues, and Handouts Education comprehension: verbalized understanding  HOME EXERCISE PROGRAM: Access Code: [email or web address] URL: [email or web address] Date: 09/16/2023 Prepared by: Cherylene Corrente  Exercises - Supine Piriformis Stretch Pulling Heel to Hip  - 2 x daily - 7 x weekly - 1 sets - 5 reps - 15 hold - Supine Hip Adductor Stretch  - 2 x daily - 7 x weekly - 1 sets - 5 reps - 15 hold - Modified Thomas Stretch  - 2 x daily - 7 x weekly - 1 sets - 5 reps - 15 hold  ASSESSMENT:  CLINICAL IMPRESSION: Started exercises today, he has the most difficulty with lifting the left leg up, marches, getting in and out of car and bed. Tends to use the hands to help lift the leg.  He reports pain with this and reports it is mm pain, tried some STM to this area to see if it helps  Patient is a 89 y.o. male who was seen today for physical therapy evaluation and treatment for left hip pain he is really struggling with getting up from sitting and walking > 80 feet due  to pain and fatigue in the left hip.  Very tender in the left GT area, also c/o a pinch in the left groin.   OBJECTIVE IMPAIRMENTS: Abnormal gait, cardiopulmonary status limiting activity, decreased activity tolerance, decreased balance, decreased coordination, decreased endurance, decreased mobility, difficulty walking, decreased ROM, decreased strength, increased muscle spasms, impaired flexibility, improper body mechanics, postural dysfunction, and pain.   REHAB POTENTIAL: Good  CLINICAL DECISION MAKING: Stable/uncomplicated  EVALUATION COMPLEXITY: Low   GOALS: Goals reviewed with patient? Yes  SHORT TERM GOALS: Target date: 10/08/23 Independent with initial HEP Baseline: Goal status: ,met 10/06/23  LONG TERM GOALS: Target date: 12/17/23  Independent with advanced HEP Baseline:  Goal status: INITIAL  2.  Walk most community distances without AD Baseline:  Goal status: INITIAL  3.  Decrease pain 50% Baseline:  Goal status: INITIAL  4.  Decrease TUG to 13 seconds Baseline:  Goal status: INITIAL  5.  Decrease 5XSTS to 32 seconds Baseline:  Goal status: INITIAL  6.  Walk 400 feet in 3 minutes Baseline:  Goal status: INITIAL   PLAN:  PT FREQUENCY: 1-2x/week  PT DURATION: 12 weeks  PLANNED INTERVENTIONS: 97164- PT Re-evaluation, 97110-Therapeutic exercises, 97530- Therapeutic activity, 97112- Neuromuscular re-education, 97535- Self Care, 16109- Manual therapy, [phone]- Gait training, [phone]- Ionotophoresis 4mg /ml Dexamethasone , Patient/Family education, Balance training, Stair training, Taping, Dry Needling, Joint mobilization, Cryotherapy, and Moist heat  PLAN FOR NEXT SESSION: ROM, gait, could try ionto, see how the STM did  with pain   Demonica Farrey W, PT 10/06/2023, 10:21 AM

## 2023-10-13 ENCOUNTER — Encounter: Payer: Self-pay | Admitting: Physical Therapy

## 2023-10-13 ENCOUNTER — Ambulatory Visit: Admitting: Physical Therapy

## 2023-10-13 DIAGNOSIS — R262 Difficulty in walking, not elsewhere classified: Secondary | ICD-10-CM | POA: Diagnosis not present

## 2023-10-13 DIAGNOSIS — M6281 Muscle weakness (generalized): Secondary | ICD-10-CM

## 2023-10-13 DIAGNOSIS — M25552 Pain in left hip: Secondary | ICD-10-CM | POA: Diagnosis not present

## 2023-10-13 DIAGNOSIS — R2681 Unsteadiness on feet: Secondary | ICD-10-CM | POA: Diagnosis not present

## 2023-10-13 DIAGNOSIS — R252 Cramp and spasm: Secondary | ICD-10-CM | POA: Diagnosis not present

## 2023-10-13 NOTE — Therapy (Signed)
 OUTPATIENT PHYSICAL THERAPY LOWER EXTREMITY TREATMENT   Patient Name: Connor [name] MRN: [id number] DOB:[birth year], 89 y.o., male Today's Date: 10/13/2023  END OF SESSION:  PT End of Session - 10/13/23 1014     Visit Number 3    Number of Visits 13    Date for PT Re-Evaluation 12/17/23    Authorization Type [employer] 2/12    PT Start Time 1015    PT Stop Time 1100    PT Time Calculation (min) 45 min    Activity Tolerance Patient tolerated treatment well    Behavior During Therapy [facility] for tasks assessed/performed             Past Medical History:  Diagnosis Date   Allergy 1985   Benign localized prostatic hyperplasia with lower urinary tract symptoms (LUTS)    urologist-- dr Joie Narrow   Cancer [facility]) 07/2018   prostate   Cataract 12/2012   CHF (congestive heart failure) (HCC) ?   Chronic anticoagulation    Xarelto  for afib   Chronic constipation    Chronic cystitis    Clotting disorder (HCC)    GERD (gastroesophageal reflux disease)    Gross hematuria    Heart murmur ?   History of DVT of lower extremity 06/08/2010   right femoral dvt dx post op total hip 05-08-2010   History of gastric ulcer    remote   History of kidney stones    one stone. remote hx   Hypercholesteremia    Hyperlipidemia ?   Hypertension    Dx by Dr. Chilton Couch around age  52    Macular degeneration    dry   NICM (nonischemic cardiomyopathy) (HCC)    per echo 06-11-2017,  ef 45-50% with diffuse hypokinesis,  G1DD   OSA on CPAP 2017   compliant with CPAP.  managed by Dr Villa Greaser.   Osteoarthritis    Other urethral stricture, male, meatal    chronic;  pt self dilates   Paroxysmal atrial fibrillation [facility]) cardiologist-- dr Nunzio Belch   first dx 2009/   a. documented by Dr Palmira Bock on office EKG 9/12. b. maintained on Tikosyn  and Xarelto . c. s/p DCCV 's.  d.  x3  EP w/ ablation atrial fib last one 12-05-2016   Prediabetes    Premature ventricular contraction    Presence of Watchman left atrial  appendage closure device 05/30/2022   Watchman FLX 24mm by Dr. Marven Slimmer   PVC's (premature ventricular contractions)    RA (rheumatoid arthritis) [facility])    rheumatologist--- Dr Lum Salina,  treated with Humira   RBBB (right bundle branch block)    Hx of a fib, ablation Aug 2018   Rheumatoid arthritis (HCC)    Sleep apnea    Ulcer    Vitamin D  deficiency    Wears hearing aid in both ears    Past Surgical History:  Procedure Laterality Date   ABLATION OF DYSRHYTHMIC FOCUS  08/29/2015   ATRIAL FIBRILLATION ABLATION  12/05/2016   ATRIAL FIBRILLATION ABLATION N/A 12/05/2016   Procedure: Atrial Fibrillation Ablation;  Surgeon: Jolly Needle, MD;  Location: [facility] INVASIVE CV LAB;  Service: Cardiovascular;  Laterality: N/A;   ATRIAL FIBRILLATION ABLATION N/A 02/29/2020   Procedure: ATRIAL FIBRILLATION ABLATION;  Surgeon: Jolly Needle, MD;  Location: [facility] INVASIVE CV LAB;  Service: Cardiovascular;  Laterality: N/A;   ATRIAL FLUTTER ABLATION  09/2010   by Mare Shallow Right 1990s   CARDIOVERSION N/A 10/28/2012   Procedure: CARDIOVERSION;  Surgeon: Veryl Gottron  Seferino Dade, MD;  Location: [facility] OR;  Service: Cardiovascular;  Laterality: N/A;   CATARACT EXTRACTION W/ INTRAOCULAR LENS  IMPLANT, BILATERAL  2019   CYSTOSCOPY WITH BIOPSY N/A 09/03/2018   Procedure: CYSTOSCOPY WITH FULGURATION Fredrick Jenkins;  Surgeon: Trent Frizzle, MD;  Location: [facility] ORS;  Service: Urology;  Laterality: N/A;  30 MINS   CYSTOSCOPY WITH FULGERATION N/A 01/23/2022   Procedure: CYSTOSCOPY WITH FULGERATION OF PROSTATIC URETHRA;  Surgeon: Trent Frizzle, MD;  Location: [facility] ORS;  Service: Urology;  Laterality: N/A;  30 MINS   DENTAL SURGERY     ELECTROPHYSIOLOGIC STUDY N/A 08/29/2015   Procedure: Atrial Fibrillation Ablation;  Surgeon: Jolly Needle, MD;  Location: [facility] INVASIVE CV LAB;  Service: Cardiovascular;  Laterality: N/A;   EYE SURGERY     Cataracts   JOINT REPLACEMENT  1995, 1997   KNEE SURGERY Bilateral x3   1960s  & 1970s   open   LEFT ATRIAL APPENDAGE OCCLUSION N/A 05/30/2022   Procedure: LEFT ATRIAL APPENDAGE OCCLUSION;  Surgeon: Boyce Byes, MD;  Location: [facility] INVASIVE CV LAB;  Service: Cardiovascular;  Laterality: N/A;   LEFT HEART CATHETERIZATION WITH CORONARY ANGIOGRAM N/A 06/07/2011   Procedure: LEFT HEART CATHETERIZATION WITH CORONARY ANGIOGRAM;  Surgeon: Peter M Swaziland, MD;  Location: [facility] CATH LAB;  Service: Cardiovascular;  Laterality: N/A;    Normal coronary arteries,  low normal LVF   TEE WITHOUT CARDIOVERSION N/A 05/30/2022   Procedure: TRANSESOPHAGEAL ECHOCARDIOGRAM (TEE);  Surgeon: Boyce Byes, MD;  Location: [facility] INVASIVE CV LAB;  Service: Cardiovascular;  Laterality: N/A;   TONSILLECTOMY AND ADENOIDECTOMY  age 21   TOTAL HIP ARTHROPLASTY Right 06/04/10    dr Jinger Mount  @[facility]    TOTAL KNEE ARTHROPLASTY Bilateral right 1995;  left 1997   TRANSURETHRAL RESECTION OF PROSTATE  06-29-2002   dr humphries  @[facility]    TRANSURETHRAL RESECTION OF PROSTATE N/A 07/06/2018   Procedure: TRANSURETHRAL RESECTION OF THE PROSTATE (TURP);  Surgeon: Trent Frizzle, MD;  Location: [facility];  Service: Urology;  Laterality: N/A;  45 MINS   Patient Active Problem List   Diagnosis Date Noted   Pain due to onychomycosis of toenails of both feet 07/03/2023   Atrial fibrillation (HCC) 05/30/2022   Presence of Watchman left atrial appendage closure device 05/30/2022   Hematuria 01/04/2022   Gross hematuria 01/04/2022   Secondary hypercoagulable state (HCC) 03/28/2020   Malignant neoplasm of prostate (HCC) 09/11/2018   Enlarged prostate with urinary obstruction 07/06/2018   Gastrocnemius strain, right, initial encounter 12/10/2017   Baker's cyst of knee, right 12/10/2017   Obesity 03/24/2017   Paroxysmal atrial fibrillation (HCC) 12/05/2016   Near syncope 04/20/2016   Leg hematoma, left, initial encounter 04/20/2016   Bleeding    Ecchymosis    Left hamstring muscle strain    Muscle  tear    RBBB 08/30/2015   Chronic systolic dysfunction of left ventricle 07/11/2014   OSA (obstructive sleep apnea) 04/07/2013   Multinodular goiter 01/20/2013   Cervical spondylosis without myelopathy 01/18/2013   Left ventricular dysfunction 12/17/2011   Low back pain 09/11/2011   Fatigue 05/22/2011   PVC (premature ventricular contraction) 01/18/2011   Persistent atrial fibrillation (HCC) 01/12/2011   Hyperlipidemia 08/02/2010   Hypertension 08/02/2010   Osteoarthritis 08/02/2010   BPH (benign prostatic hyperplasia) 08/02/2010   GERD (gastroesophageal reflux disease) 08/02/2010   h/o Atrial flutter (HCC) s/p ablation 2012     PCP: Copland, MD  REFERRING PROVIDER: Copland, MD  REFERRING DIAG: left hip pain  THERAPY DIAG:  Pain in left hip  Muscle weakness (generalized)  Difficulty in walking, not elsewhere classified  Unsteadiness on feet  Cramp and spasm  Rationale for Evaluation and Treatment: Rehabilitation  ONSET DATE: 09/15/23  SUBJECTIVE:   SUBJECTIVE STATEMENT: Patient reports that he was pretty sore after the last treatment\ Still very difficult in and out of the car, has to use hands to lift the leg Patient reports that about a month ago he was doing the recumbent bike like he normally does, reports that the next day had some left hip pain.  He reports that it has gotten progressively worse where he was having a lot of difficulty, reports a recent x-ray showed no bone on bone, some arthritis, has had to start using a SPC.  Had a cortisone injection in the left hip 3 weeks ago and it has helped the sharp pain.  PERTINENT HISTORY: Bilateral TKA, right THA, A-fib, Watchman placed PAIN:  Are you having pain? Yes: NPRS scale: 0/10 Pain location: anterior left hip and GT area Pain description: sharp, throbbing Aggravating factors: walking pain up to 8/10 Relieving factors: rest, sit down pain can be o/10  PRECAUTIONS: None  RED FLAGS: None   WEIGHT  BEARING RESTRICTIONS: No  FALLS:  Has patient fallen in last 6 months? No  LIVING ENVIRONMENT: Lives with: lives with their family Lives in: House/apartment Stairs: Yes: Internal: 12 steps; can reach both Has following equipment at home: Single point cane and    OCCUPATION: retired  PLOF: Independent and some yardwork, was doing some gym activities, does go to [employer] often  PATIENT GOALS: less pain, walk better, better strength  NEXT MD VISIT: none scheduled  OBJECTIVE:  Note: Objective measures were completed at Evaluation unless otherwise noted.  DIAGNOSTIC FINDINGS: reports to me that the x-rays looked good  PATIENT SURVEYS:  LEFS 16.3%  COGNITION: Overall cognitive status: Within functional limits for tasks assessed     SENSATION: WFL  MUSCLE LENGTH: Mild tightness in the HS, tight calves  POSTURE: rounded shoulders, forward head, and decreased lumbar lordosis  PALPATION: Tender in the left GT area  LOWER EXTREMITY ROM:  Active ROM Right eval Left eval  Hip flexion  60  Hip extension  5  Hip abduction  11  Hip adduction    Hip internal rotation    Hip external rotation  5  Knee flexion    Knee extension    Ankle dorsiflexion    Ankle plantarflexion    Ankle inversion    Ankle eversion     (Blank rows = not tested) Cannot cross the left leg over the right, has to use hands and is painful  LOWER EXTREMITY MMT:  MMT Right eval Left eval  Hip flexion  3+  Hip extension  4-  Hip abduction  4-  Hip adduction  4  Hip internal rotation    Hip external rotation    Knee flexion    Knee extension    Ankle dorsiflexion    Ankle plantarflexion    Ankle inversion    Ankle eversion     (Blank rows = not tested)  LOWER EXTREMITY SPECIAL TESTS:  Hip special tests: Trendelenburg test: positive , Hip scouring test: negative, and Piriformis test: positive   FUNCTIONAL TESTS:  5 times sit to stand: 60 seconds, has to use hands Timed up and go  (TUG): 21 seconds 3 minute walk test: unable to go past 1 minute 80 feet, pain  GAIT: Distance walked: 80 feet Assistive device utilized: Single point cane Level of assistance: Modified independence Comments: slow, very difficult with getting up, antalgic on the left                                                                                                                                TREATMENT DATE:  10/13/23 2# marches seated 2# marches standing 2# hip abduction 2# hip extension Ball b/n knees squeeze Nustep level 5 x 6 minutes Did some skilled palpations to find what he was saying is hurting, it is not the tendon or the joint he is tender in the left quad area.  STM to this area Ionto 80mA dose to the left quad area mid  10/06/23 UBE level 4 x 6 minutes Nustep level 5 x 6 minutes 2.5# standing marches, hip abduction and extension On airex cone toe touch On airex ball toss 10# straight arm pulls Feet on ball K2C, rotation, bridges, isometric ab Ball b/n knees squeeze Blue tband clamshells STM with the T-gun to the left hip flexor, quad, adductor and glute  09/16/23 Evaluation    PATIENT EDUCATION:  Education details: POC/HEP Person educated: Patient Education method: Programmer, multimedia, Facilities manager, Verbal cues, and Handouts Education comprehension: verbalized understanding  HOME EXERCISE PROGRAM: Access Code: [email or web address] URL: [email or web address] Date: 09/16/2023 Prepared by: Cherylene Corrente  Exercises - Supine Piriformis Stretch Pulling Heel to Hip  - 2 x daily - 7 x weekly - 1 sets - 5 reps - 15 hold - Supine Hip Adductor Stretch  - 2 x daily - 7 x weekly - 1 sets - 5 reps - 15 hold - Modified Thomas Stretch  - 2 x daily - 7 x weekly - 1 sets - 5 reps - 15 hold  ASSESSMENT:  CLINICAL IMPRESSION: Patient still pretty sore with rasing the leg up, he denies hip pain and after really doing some questioning and palpation it seems that his pain is  in the mid quad on the left, he is very tender here.  I elected to try some ionto to this area today.  Just to see if this would help  Patient is a 89 y.o. male who was seen today for physical therapy evaluation and treatment for left hip pain he is really struggling with getting up from sitting and walking > 80 feet due to pain and fatigue in the left hip.  Very tender in the left GT area, also c/o a pinch in the left groin.   OBJECTIVE IMPAIRMENTS: Abnormal gait, cardiopulmonary status limiting activity, decreased activity tolerance, decreased balance, decreased coordination, decreased endurance, decreased mobility, difficulty walking, decreased ROM, decreased strength, increased muscle spasms, impaired flexibility, improper body mechanics, postural dysfunction, and pain.   REHAB POTENTIAL: Good  CLINICAL DECISION MAKING: Stable/uncomplicated  EVALUATION COMPLEXITY: Low   GOALS: Goals reviewed with patient? Yes  SHORT TERM GOALS: Target date: 10/08/23 Independent with initial HEP Baseline: Goal status: ,met 10/06/23  LONG TERM GOALS: Target date: 12/17/23  Independent with advanced HEP Baseline:  Goal status: INITIAL  2.  Walk most community distances without AD Baseline:  Goal status: ongoing 10/13/23  3.  Decrease pain 50% Baseline:  Goal status: INITIAL  4.  Decrease TUG to 13 seconds Baseline:  Goal status: INITIAL  5.  Decrease 5XSTS to 32 seconds Baseline:  Goal status: INITIAL  6.  Walk 400 feet in 3 minutes Baseline:  Goal status: INITIAL   PLAN:  PT FREQUENCY: 1-2x/week  PT DURATION: 12 weeks  PLANNED INTERVENTIONS: 97164- PT Re-evaluation, 97110-Therapeutic exercises, 97530- Therapeutic activity, 97112- Neuromuscular re-education, 97535- Self Care, 82956- Manual therapy, 97116- Gait training, [phone]- Ionotophoresis 4mg /ml Dexamethasone , Patient/Family education, Balance training, Stair training, Taping, Dry Needling, Joint mobilization, Cryotherapy, and Moist  heat  PLAN FOR NEXT SESSION: ROM, gait, could try ionto, see how the STM did  with pain   Afrah Burlison W, PT 10/13/2023, 10:15 AM

## 2023-10-13 NOTE — Patient Instructions (Signed)

## 2023-10-14 DIAGNOSIS — H353132 Nonexudative age-related macular degeneration, bilateral, intermediate dry stage: Secondary | ICD-10-CM | POA: Diagnosis not present

## 2023-10-14 DIAGNOSIS — E663 Overweight: Secondary | ICD-10-CM | POA: Diagnosis not present

## 2023-10-14 DIAGNOSIS — Z79899 Other long term (current) drug therapy: Secondary | ICD-10-CM | POA: Diagnosis not present

## 2023-10-14 DIAGNOSIS — Z6829 Body mass index (BMI) 29.0-29.9, adult: Secondary | ICD-10-CM | POA: Diagnosis not present

## 2023-10-14 DIAGNOSIS — M1991 Primary osteoarthritis, unspecified site: Secondary | ICD-10-CM | POA: Diagnosis not present

## 2023-10-14 DIAGNOSIS — R5383 Other fatigue: Secondary | ICD-10-CM | POA: Diagnosis not present

## 2023-10-14 DIAGNOSIS — M0600 Rheumatoid arthritis without rheumatoid factor, unspecified site: Secondary | ICD-10-CM | POA: Diagnosis not present

## 2023-10-21 NOTE — Progress Notes (Signed)
 HPI: Follow-up atrial fibrillation, hypertension, hyperlipidemia. Patient had a normal cardiac catheterization in 2013.  Patient had atrial fibrillation ablation in 2017 and 18.  He again had atrial fibrillation ablation in October 2021 with Dr. Kelsie. Echocardiogram December 2023 showed normal LV function, grade 2 diastolic dysfunction, moderate right ventricular enlargement, moderate left atrial enlargement, mild to moderate right atrial enlargement, mild mitral regurgitation, mild aortic insufficiency, mildly dilated aortic root.  Had Watchman placed January 2024 (history of hematuria).  Follow-up TEE January 2024 showed normal LV function, moderate right ventricular enlargement, biatrial enlargement, small pericardial effusion, mild mitral regurgitation, trace aortic insufficiency, PFO.  Follow-up CTA March 2024 showed watchman in place with no device leak or thrombus detected, PFO noted.  Since last seen, patient denies dyspnea, chest pain, palpitations or syncope.  Current Outpatient Medications  Medication Sig Dispense Refill   acetaminophen  (TYLENOL ) 500 MG tablet Take 1,000 mg by mouth 2 (two) times daily as needed for moderate pain.     aspirin  EC 81 MG tablet Take 1 tablet (81 mg total) by mouth daily. Swallow whole.     atorvastatin  (LIPITOR) 10 MG tablet Take 1 tablet (10 mg total) by mouth daily. 90 tablet 1   Carboxymethylcellulose Sodium (THERATEARS OP) Apply 1 drop to eye daily as needed (dry eyes).     cephALEXin  (KEFLEX ) 500 MG capsule Take 2,000 mg by mouth as directed. Dental appointments     Cholecalciferol  (VITAMIN D3) 5000 UNITS TABS Take 5,000 Units by mouth every morning.     clindamycin (CLEOCIN T) 1 % external solution Apply 1 Application topically daily.     Cranberry 1000 MG CAPS Take 4,000 mg by mouth 2 (two) times daily.     FLUZONE HIGH-DOSE 0.5 ML injection Inject 0.5 mLs into the muscle once.     HUMIRA PEN 40 MG/0.4ML PNKT Inject 40 mg as directed every 7  (seven) days. Every Tuesday  6   lisinopril  (ZESTRIL ) 40 MG tablet TAKE 1 TABLET BY MOUTH EVERY DAY 90 tablet 3   Melatonin 10 MG CHEW Chew 3 Pieces of gum by mouth at bedtime. gummy     metoprolol  succinate (TOPROL -XL) 50 MG 24 hr tablet Take 1 tablet (50 mg total) by mouth 2 (two) times daily. Take with or immediately following a meal. 180 tablet 3   Multiple Vitamins-Minerals (PRESERVISION AREDS 2) CAPS Take 1 capsule by mouth 2 (two) times a day.     pantoprazole  (PROTONIX ) 40 MG tablet TAKE 1 TABLET BY MOUTH EVERY DAY 90 tablet 3   senna (SENOKOT) 8.6 MG TABS tablet Take 2 tablets by mouth at bedtime. Per patient taking 2 tablets daily     solifenacin (VESICARE) 10 MG tablet Take 10 mg by mouth at bedtime.      SPIKEVAX syringe Inject 0.5 mLs into the muscle once.     tamsulosin  (FLOMAX ) 0.4 MG CAPS capsule Take 1 capsule (0.4 mg total) by mouth daily after supper. 30 capsule 5   Testosterone  20.25 MG/ACT (1.62%) GEL Apply 1 Pump topically at bedtime. Apply to shoulders     vitamin B-12 (CYANOCOBALAMIN ) 500 MCG tablet Take 500 mcg by mouth every evening.      No current facility-administered medications for this visit.     Past Medical History:  Diagnosis Date   Allergy 1985   Benign localized prostatic hyperplasia with lower urinary tract symptoms (LUTS)    urologist-- dr matilda   Cancer [facility]) 07/2018   prostate   Cataract  12/2012   CHF (congestive heart failure) (HCC) ?   Chronic anticoagulation    Xarelto  for afib   Chronic constipation    Chronic cystitis    Clotting disorder (HCC)    GERD (gastroesophageal reflux disease)    Gross hematuria    Heart murmur ?   History of DVT of lower extremity 06/08/2010   right femoral dvt dx post op total hip 05-08-2010   History of gastric ulcer    remote   History of kidney stones    one stone. remote hx   Hypercholesteremia    Hyperlipidemia ?   Hypertension    Dx by Dr. Salena Skill around age  26    Macular degeneration     dry   NICM (nonischemic cardiomyopathy) (HCC)    per echo 06-11-2017,  ef 45-50% with diffuse hypokinesis,  G1DD   OSA on CPAP 2017   compliant with CPAP.  managed by Dr Jude.   Osteoarthritis    Other urethral stricture, male, meatal    chronic;  pt self dilates   Paroxysmal atrial fibrillation [facility]) cardiologist-- dr kelsie   first dx 2009/   a. documented by Dr Comer on office EKG 9/12. b. maintained on Tikosyn  and Xarelto . c. s/p DCCV 's.  d.  x3  EP w/ ablation atrial fib last one 12-05-2016   Prediabetes    Premature ventricular contraction    Presence of Watchman left atrial appendage closure device 05/30/2022   Watchman FLX 24mm by Dr. Cindie   PVC's (premature ventricular contractions)    RA (rheumatoid arthritis) [facility])    rheumatologist--- Dr LORELEI Jacob,  treated with Humira   RBBB (right bundle branch block)    Hx of a fib, ablation Aug 2018   Rheumatoid arthritis (HCC)    Sleep apnea    Ulcer    Vitamin D  deficiency    Wears hearing aid in both ears     Past Surgical History:  Procedure Laterality Date   ABLATION OF DYSRHYTHMIC FOCUS  08/29/2015   ATRIAL FIBRILLATION ABLATION  12/05/2016   ATRIAL FIBRILLATION ABLATION N/A 12/05/2016   Procedure: Atrial Fibrillation Ablation;  Surgeon: kelsie Agent, MD;  Location: [facility] INVASIVE CV LAB;  Service: Cardiovascular;  Laterality: N/A;   ATRIAL FIBRILLATION ABLATION N/A 02/29/2020   Procedure: ATRIAL FIBRILLATION ABLATION;  Surgeon: kelsie Agent, MD;  Location: [facility] INVASIVE CV LAB;  Service: Cardiovascular;  Laterality: N/A;   ATRIAL FLUTTER ABLATION  09/2010   by MILUS EDWARDS Right 1990s   CARDIOVERSION N/A 10/28/2012   Procedure: CARDIOVERSION;  Surgeon: Lonni JONETTA Cash, MD;  Location: [facility] OR;  Service: Cardiovascular;  Laterality: N/A;   CATARACT EXTRACTION W/ INTRAOCULAR LENS  IMPLANT, BILATERAL  2019   CYSTOSCOPY WITH BIOPSY N/A 09/03/2018   Procedure: CYSTOSCOPY WITH FULGURATION CELESTA;  Surgeon:  Matilda Senior, MD;  Location: [facility] ORS;  Service: Urology;  Laterality: N/A;  30 MINS   CYSTOSCOPY WITH FULGERATION N/A 01/23/2022   Procedure: CYSTOSCOPY WITH FULGERATION OF PROSTATIC URETHRA;  Surgeon: Matilda Senior, MD;  Location: [facility] ORS;  Service: Urology;  Laterality: N/A;  30 MINS   DENTAL SURGERY     ELECTROPHYSIOLOGIC STUDY N/A 08/29/2015   Procedure: Atrial Fibrillation Ablation;  Surgeon: Agent kelsie, MD;  Location: [facility] INVASIVE CV LAB;  Service: Cardiovascular;  Laterality: N/A;   EYE SURGERY     Cataracts   JOINT REPLACEMENT  1995, 1997   KNEE SURGERY Bilateral x3   1960s & 1970s  open   LEFT ATRIAL APPENDAGE OCCLUSION N/A 05/30/2022   Procedure: LEFT ATRIAL APPENDAGE OCCLUSION;  Surgeon: Cindie Ole DASEN, MD;  Location: [facility] CV LAB;  Service: Cardiovascular;  Laterality: N/A;   LEFT HEART CATHETERIZATION WITH CORONARY ANGIOGRAM N/A 06/07/2011   Procedure: LEFT HEART CATHETERIZATION WITH CORONARY ANGIOGRAM;  Surgeon: Peter M Swaziland, MD;  Location: [facility] CATH LAB;  Service: Cardiovascular;  Laterality: N/A;    Normal coronary arteries,  low normal LVF   TEE WITHOUT CARDIOVERSION N/A 05/30/2022   Procedure: TRANSESOPHAGEAL ECHOCARDIOGRAM (TEE);  Surgeon: Cindie Ole DASEN, MD;  Location: [facility] INVASIVE CV LAB;  Service: Cardiovascular;  Laterality: N/A;   TONSILLECTOMY AND ADENOIDECTOMY  age 89   TOTAL HIP ARTHROPLASTY Right 06/04/10    dr jane  @[facility]    TOTAL KNEE ARTHROPLASTY Bilateral right 1995;  left 1997   TRANSURETHRAL RESECTION OF PROSTATE  06-29-2002   dr humphries  @[facility]    TRANSURETHRAL RESECTION OF PROSTATE N/A 07/06/2018   Procedure: TRANSURETHRAL RESECTION OF THE PROSTATE (TURP);  Surgeon: Matilda Senior, MD;  Location: [facility];  Service: Urology;  Laterality: N/A;  4 MINS    Social History   Socioeconomic History   Marital status: Married    Spouse name: Rollene   Number of children: Not on file   Years of education: Not on  file   Highest education level: Bachelor's degree (e.g., BA, AB, BS)  Occupational History   Occupation: Retired  Tobacco Use   Smoking status: Former    Current packs/day: 0.00    Average packs/day: 1 pack/day for 30.0 years (30.0 ttl pk-yrs)    Types: Cigarettes    Start date: 05/06/1948    Quit date: 05/06/1978    Years since quitting: 45.5   Smokeless tobacco: Never   Tobacco comments:    Former smoker 03/14/2021  Vaping Use   Vaping status: Never Used  Substance and Sexual Activity   Alcohol  use: Yes    Alcohol /week: 1.0 standard drink of alcohol     Types: 1 Glasses of wine per week    Comment: occasionally wine and beer   Drug use: Never   Sexual activity: Not Currently    Birth control/protection: Surgical  Other Topics Concern   Not on file  Social History Narrative   he patient lives in [place] with his spouse.  Retired.   Social Drivers of Corporate investment banker Strain: Low Risk  (06/26/2023)   Overall Financial Resource Strain (CARDIA)    Difficulty of Paying Living Expenses: Not hard at all  Food Insecurity: No Food Insecurity (06/26/2023)   Hunger Vital Sign    Worried About Running Out of Food in the Last Year: Never true    Ran Out of Food in the Last Year: Never true  Transportation Needs: No Transportation Needs (06/26/2023)   PRAPARE - Administrator, Civil Service (Medical): No    Lack of Transportation (Non-Medical): No  Physical Activity: Inactive (06/26/2023)   Exercise Vital Sign    Days of Exercise per Week: 0 days    Minutes of Exercise per Session: 0 min  Stress: No Stress Concern Present (06/26/2023)   [employer] of Occupational Health - Occupational Stress Questionnaire    Feeling of Stress : Not at all  Social Connections: Moderately Integrated (06/26/2023)   Social Connection and Isolation Panel    Frequency of Communication with Friends and Family: More than three times a week    Frequency of Social Gatherings  with  Friends and Family: Three times a week    Attends Religious Services: More than 4 times per year    Active Member of Clubs or Organizations: No    Attends Banker Meetings: Never    Marital Status: Married  Catering manager Violence: Not At Risk (06/26/2023)   Humiliation, Afraid, Rape, and Kick questionnaire    Fear of Current or Ex-Partner: No    Emotionally Abused: No    Physically Abused: No    Sexually Abused: No    Family History  Problem Relation Age of Onset   Heart attack Father    Heart disease Father    Obesity Father    Lung disease Mother    Arthritis Mother    Arrhythmia Sister    Atrial fibrillation Brother    Diabetes Neg Hx     ROS: no fevers or chills, productive cough, hemoptysis, dysphasia, odynophagia, melena, hematochezia, dysuria, hematuria, rash, seizure activity, orthopnea, PND, pedal edema, claudication. Remaining systems are negative.  Physical Exam: Well-developed well-nourished in no acute distress.  Skin is warm and dry.  HEENT is normal.  Neck is supple.  Chest is clear to auscultation with normal expansion.  Cardiovascular exam is regular rate and rhythm.  Abdominal exam nontender or distended. No masses palpated. Extremities show no edema. neuro grossly intact  EKG Interpretation Date/Time:  Wednesday October 29 2023 10:15:12 EDT Ventricular Rate:  67 PR Interval:  172 QRS Duration:  144 QT Interval:  452 QTC Calculation: 477 R Axis:   258  Text Interpretation: Normal sinus rhythm Right bundle branch block Confirmed by Pietro Rogue ([id number]) on 10/29/2023 10:16:04 AM    A/P  1 paroxysmal atrial fibrillation-patient remains in sinus rhythm.  Continue Toprol  at present dose.  He is status post previous ablation on 3 separate occasions.  Watchman device is in place and he is not on anticoagulation.  2 hypertension-blood pressure is controlled.  Continue present medical regimen.  3 nonischemic cardiomyopathy-LV function has  normalized on most recent echocardiogram.  4 hyperlipidemia-continue statin.  5 obstructive sleep apnea-continue CPAP.  Rogue Pietro, MD

## 2023-10-29 ENCOUNTER — Encounter: Payer: Self-pay | Admitting: Cardiology

## 2023-10-29 ENCOUNTER — Encounter: Payer: Self-pay | Admitting: Physical Therapy

## 2023-10-29 ENCOUNTER — Ambulatory Visit: Admitting: Physical Therapy

## 2023-10-29 ENCOUNTER — Ambulatory Visit: Payer: Medicare PPO | Attending: Cardiology | Admitting: Cardiology

## 2023-10-29 VITALS — BP 114/69 | HR 67 | Ht 72.0 in | Wt 221.0 lb

## 2023-10-29 DIAGNOSIS — R2681 Unsteadiness on feet: Secondary | ICD-10-CM | POA: Diagnosis not present

## 2023-10-29 DIAGNOSIS — R252 Cramp and spasm: Secondary | ICD-10-CM

## 2023-10-29 DIAGNOSIS — R262 Difficulty in walking, not elsewhere classified: Secondary | ICD-10-CM | POA: Diagnosis not present

## 2023-10-29 DIAGNOSIS — I480 Paroxysmal atrial fibrillation: Secondary | ICD-10-CM | POA: Diagnosis not present

## 2023-10-29 DIAGNOSIS — M6281 Muscle weakness (generalized): Secondary | ICD-10-CM | POA: Diagnosis not present

## 2023-10-29 DIAGNOSIS — G4733 Obstructive sleep apnea (adult) (pediatric): Secondary | ICD-10-CM

## 2023-10-29 DIAGNOSIS — Z95818 Presence of other cardiac implants and grafts: Secondary | ICD-10-CM | POA: Diagnosis not present

## 2023-10-29 DIAGNOSIS — M25552 Pain in left hip: Secondary | ICD-10-CM

## 2023-10-29 DIAGNOSIS — I10 Essential (primary) hypertension: Secondary | ICD-10-CM

## 2023-10-29 DIAGNOSIS — I428 Other cardiomyopathies: Secondary | ICD-10-CM | POA: Diagnosis not present

## 2023-10-29 MED ORDER — METOPROLOL SUCCINATE ER 50 MG PO TB24: 50.0000 mg | ORAL_TABLET | Freq: Two times a day (BID) | ORAL | 3 refills | Status: AC

## 2023-10-29 NOTE — Therapy (Signed)
 OUTPATIENT PHYSICAL THERAPY LOWER EXTREMITY TREATMENT   Patient Name: Connor [name] MRN: [id number] DOB:[birth year], 89 y.o., male Today's Date: 10/29/2023  END OF SESSION:  PT End of Session - 10/29/23 1313     Visit Number 4    Number of Visits 13    Date for PT Re-Evaluation 12/14/23    Authorization Type [employer] 3/12    PT Start Time 1310    PT Stop Time 1355    PT Time Calculation (min) 45 min    Activity Tolerance Patient tolerated treatment well    Behavior During Therapy [facility] for tasks assessed/performed          Past Medical History:  Diagnosis Date   Allergy 1985   Benign localized prostatic hyperplasia with lower urinary tract symptoms (LUTS)    urologist-- dr matilda   Cancer [facility]) 07/2018   prostate   Cataract 12/2012   CHF (congestive heart failure) (HCC) ?   Chronic anticoagulation    Xarelto  for afib   Chronic constipation    Chronic cystitis    Clotting disorder (HCC)    GERD (gastroesophageal reflux disease)    Gross hematuria    Heart murmur ?   History of DVT of lower extremity 06/08/2010   right femoral dvt dx post op total hip 05-08-2010   History of gastric ulcer    remote   History of kidney stones    one stone. remote hx   Hypercholesteremia    Hyperlipidemia ?   Hypertension    Dx by Dr. Salena Skill around age  78    Macular degeneration    dry   NICM (nonischemic cardiomyopathy) (HCC)    per echo 06-11-2017,  ef 45-50% with diffuse hypokinesis,  G1DD   OSA on CPAP 2017   compliant with CPAP.  managed by Dr Jude.   Osteoarthritis    Other urethral stricture, male, meatal    chronic;  pt self dilates   Paroxysmal atrial fibrillation [facility]) cardiologist-- dr kelsie   first dx 2009/   a. documented by Dr Comer on office EKG 9/12. b. maintained on Tikosyn  and Xarelto . c. s/p DCCV 's.  d.  x3  EP w/ ablation atrial fib last one 12-05-2016   Prediabetes    Premature ventricular contraction    Presence of Watchman left atrial  appendage closure device 05/30/2022   Watchman FLX 24mm by Dr. Cindie   PVC's (premature ventricular contractions)    RA (rheumatoid arthritis) [facility])    rheumatologist--- Dr LORELEI Jacob,  treated with Humira   RBBB (right bundle branch block)    Hx of a fib, ablation Aug 2018   Rheumatoid arthritis (HCC)    Sleep apnea    Ulcer    Vitamin D  deficiency    Wears hearing aid in both ears    Past Surgical History:  Procedure Laterality Date   ABLATION OF DYSRHYTHMIC FOCUS  08/29/2015   ATRIAL FIBRILLATION ABLATION  12/05/2016   ATRIAL FIBRILLATION ABLATION N/A 12/05/2016   Procedure: Atrial Fibrillation Ablation;  Surgeon: kelsie Lynwood, MD;  Location: [facility] INVASIVE CV LAB;  Service: Cardiovascular;  Laterality: N/A;   ATRIAL FIBRILLATION ABLATION N/A 02/29/2020   Procedure: ATRIAL FIBRILLATION ABLATION;  Surgeon: kelsie Lynwood, MD;  Location: [facility] INVASIVE CV LAB;  Service: Cardiovascular;  Laterality: N/A;   ATRIAL FLUTTER ABLATION  09/2010   by MILUS EDWARDS Right 1990s   CARDIOVERSION N/A 10/28/2012   Procedure: CARDIOVERSION;  Surgeon: Lonni JONETTA Cash, MD;  Location: [facility] OR;  Service: Cardiovascular;  Laterality: N/A;   CATARACT EXTRACTION W/ INTRAOCULAR LENS  IMPLANT, BILATERAL  2019   CYSTOSCOPY WITH BIOPSY N/A 09/03/2018   Procedure: CYSTOSCOPY WITH FULGURATION [id number];  Surgeon: Matilda Senior, MD;  Location: [facility] ORS;  Service: Urology;  Laterality: N/A;  30 MINS   CYSTOSCOPY WITH FULGERATION N/A 01/23/2022   Procedure: CYSTOSCOPY WITH FULGERATION OF PROSTATIC URETHRA;  Surgeon: Matilda Senior, MD;  Location: [facility] ORS;  Service: Urology;  Laterality: N/A;  30 MINS   DENTAL SURGERY     ELECTROPHYSIOLOGIC STUDY N/A 08/29/2015   Procedure: Atrial Fibrillation Ablation;  Surgeon: Lynwood Rakers, MD;  Location: [facility] INVASIVE CV LAB;  Service: Cardiovascular;  Laterality: N/A;   EYE SURGERY     Cataracts   JOINT REPLACEMENT  1995, 1997   KNEE SURGERY Bilateral x3   1960s  & 1970s   open   LEFT ATRIAL APPENDAGE OCCLUSION N/A 05/30/2022   Procedure: LEFT ATRIAL APPENDAGE OCCLUSION;  Surgeon: Cindie Ole DASEN, MD;  Location: [facility] INVASIVE CV LAB;  Service: Cardiovascular;  Laterality: N/A;   LEFT HEART CATHETERIZATION WITH CORONARY ANGIOGRAM N/A 06/07/2011   Procedure: LEFT HEART CATHETERIZATION WITH CORONARY ANGIOGRAM;  Surgeon: Peter M Swaziland, MD;  Location: [facility] CATH LAB;  Service: Cardiovascular;  Laterality: N/A;    Normal coronary arteries,  low normal LVF   TEE WITHOUT CARDIOVERSION N/A 05/30/2022   Procedure: TRANSESOPHAGEAL ECHOCARDIOGRAM (TEE);  Surgeon: Cindie Ole DASEN, MD;  Location: [facility] INVASIVE CV LAB;  Service: Cardiovascular;  Laterality: N/A;   TONSILLECTOMY AND ADENOIDECTOMY  age 16   TOTAL HIP ARTHROPLASTY Right 06/04/10    dr jane  @[facility]    TOTAL KNEE ARTHROPLASTY Bilateral right 1995;  left 1997   TRANSURETHRAL RESECTION OF PROSTATE  06-29-2002   dr humphries  @[facility]    TRANSURETHRAL RESECTION OF PROSTATE N/A 07/06/2018   Procedure: TRANSURETHRAL RESECTION OF THE PROSTATE (TURP);  Surgeon: Matilda Senior, MD;  Location: [facility];  Service: Urology;  Laterality: N/A;  45 MINS   Patient Active Problem List   Diagnosis Date Noted   Pain due to onychomycosis of toenails of both feet 07/03/2023   Atrial fibrillation (HCC) 05/30/2022   Presence of Watchman left atrial appendage closure device 05/30/2022   Hematuria 01/04/2022   Gross hematuria 01/04/2022   Secondary hypercoagulable state (HCC) 03/28/2020   Malignant neoplasm of prostate (HCC) 09/11/2018   Enlarged prostate with urinary obstruction 07/06/2018   Gastrocnemius strain, right, initial encounter 12/10/2017   Baker's cyst of knee, right 12/10/2017   Obesity 03/24/2017   Paroxysmal atrial fibrillation (HCC) 12/05/2016   Near syncope 04/20/2016   Leg hematoma, left, initial encounter 04/20/2016   Bleeding    Ecchymosis    Left hamstring muscle strain    Muscle  tear    RBBB 08/30/2015   Chronic systolic dysfunction of left ventricle 07/11/2014   OSA (obstructive sleep apnea) 04/07/2013   Multinodular goiter 01/20/2013   Cervical spondylosis without myelopathy 01/18/2013   Left ventricular dysfunction 12/17/2011   Low back pain 09/11/2011   Fatigue 05/22/2011   PVC (premature ventricular contraction) 01/18/2011   Persistent atrial fibrillation (HCC) 01/12/2011   Hyperlipidemia 08/02/2010   Hypertension 08/02/2010   Osteoarthritis 08/02/2010   BPH (benign prostatic hyperplasia) 08/02/2010   GERD (gastroesophageal reflux disease) 08/02/2010   h/o Atrial flutter (HCC) s/p ablation 2012     PCP: Copland, MD  REFERRING PROVIDER: Copland, MD  REFERRING DIAG: left hip pain  THERAPY DIAG:  Pain  in left hip  Muscle weakness (generalized)  Difficulty in walking, not elsewhere classified  Unsteadiness on feet  Cramp and spasm  Rationale for Evaluation and Treatment: Rehabilitation  ONSET DATE: 09/15/23  SUBJECTIVE:   SUBJECTIVE STATEMENT: Patient reports that he is still really hurting when he lifts the left leg up for in and out of the car pain up to 5/10, sitting no pain, walking minimal pain, no problems sleeping, will see the MD tomorrow Patient reports that about a month ago he was doing the recumbent bike like he normally does, reports that the next day had some left hip pain.  He reports that it has gotten progressively worse where he was having a lot of difficulty, reports a recent x-ray showed no bone on bone, some arthritis, has had to start using a SPC.  Had a cortisone injection in the left hip 3 weeks ago and it has helped the sharp pain.  PERTINENT HISTORY: Bilateral TKA, right THA, A-fib, Watchman placed PAIN:  Are you having pain? Yes: NPRS scale: 0/10 Pain location: anterior left hip and GT area Pain description: sharp, throbbing Aggravating factors: walking pain up to 8/10 Relieving factors: rest, sit down pain can  be o/10  PRECAUTIONS: None  RED FLAGS: None   WEIGHT BEARING RESTRICTIONS: No  FALLS:  Has patient fallen in last 6 months? No  LIVING ENVIRONMENT: Lives with: lives with their family Lives in: House/apartment Stairs: Yes: Internal: 12 steps; can reach both Has following equipment at home: Single point cane and    OCCUPATION: retired  PLOF: Independent and some yardwork, was doing some gym activities, does go to [employer] often  PATIENT GOALS: less pain, walk better, better strength  NEXT MD VISIT: none scheduled  OBJECTIVE:  Note: Objective measures were completed at Evaluation unless otherwise noted.  DIAGNOSTIC FINDINGS: reports to me that the x-rays looked good  PATIENT SURVEYS:  LEFS 16.3%  COGNITION: Overall cognitive status: Within functional limits for tasks assessed     SENSATION: WFL  MUSCLE LENGTH: Mild tightness in the HS, tight calves  POSTURE: rounded shoulders, forward head, and decreased lumbar lordosis  PALPATION: Tender in the left GT area  LOWER EXTREMITY ROM:  Active ROM Right eval Left eval  Hip flexion  60  Hip extension  5  Hip abduction  11  Hip adduction    Hip internal rotation    Hip external rotation  5  Knee flexion    Knee extension    Ankle dorsiflexion    Ankle plantarflexion    Ankle inversion    Ankle eversion     (Blank rows = not tested) Cannot cross the left leg over the right, has to use hands and is painful  LOWER EXTREMITY MMT:  MMT Right eval Left eval  Hip flexion  3+  Hip extension  4-  Hip abduction  4-  Hip adduction  4  Hip internal rotation    Hip external rotation    Knee flexion    Knee extension    Ankle dorsiflexion    Ankle plantarflexion    Ankle inversion    Ankle eversion     (Blank rows = not tested)  LOWER EXTREMITY SPECIAL TESTS:  Hip special tests: Trendelenburg test: positive , Hip scouring test: negative, and Piriformis test: positive   FUNCTIONAL TESTS:  5 times sit  to stand: 60 seconds, has to use hands Timed up and go (TUG): 21 seconds 3 minute walk test: unable to go  past 1 minute 80 feet, pain   GAIT: Distance walked: 80 feet Assistive device utilized: Single point cane Level of assistance: Modified independence Comments: slow, very difficult with getting up, antalgic on the left                                                                                                                                TREATMENT DATE:  10/29/23 Nustep level 5 x 6 minutes 25# leg curls Ball b/n knees squeeze Green tband clam shells 2# marches 2# hip abduction 2# hip extension STM with the T-gun to the left anterior hip and some into the glute and the ITB Ionto 80mA dose 4 hour patch 2nd treatment  10/13/23 2# marches seated 2# marches standing 2# hip abduction 2# hip extension Ball b/n knees squeeze Nustep level 5 x 6 minutes Did some skilled palpations to find what he was saying is hurting, it is not the tendon or the joint he is tender in the left quad area.  STM to this area Ionto 80mA dose to the left quad area mid  10/06/23 UBE level 4 x 6 minutes Nustep level 5 x 6 minutes 2.5# standing marches, hip abduction and extension On airex cone toe touch On airex ball toss 10# straight arm pulls Feet on ball K2C, rotation, bridges, isometric ab Ball b/n knees squeeze Blue tband clamshells STM with the T-gun to the left hip flexor, quad, adductor and glute  09/16/23 Evaluation    PATIENT EDUCATION:  Education details: POC/HEP Person educated: Patient Education method: Programmer, multimedia, Facilities manager, Verbal cues, and Handouts Education comprehension: verbalized understanding  HOME EXERCISE PROGRAM: Access Code: [email or web address] URL: [email or web address] Date: 09/16/2023 Prepared by: Ozell Mainland  Exercises - Supine Piriformis Stretch Pulling Heel to Hip  - 2 x daily - 7 x weekly - 1 sets - 5 reps - 15 hold - Supine Hip Adductor  Stretch  - 2 x daily - 7 x weekly - 1 sets - 5 reps - 15 hold - Modified Thomas Stretch  - 2 x daily - 7 x weekly - 1 sets - 5 reps - 15 hold  ASSESSMENT:  CLINICAL IMPRESSION: Patient still really hurting with any time he raises the left leg, mostly when getting in and out of bed, pain up to 6/10, other times the pain is not too bad.  He reports feeling like it is in the mms of the hip.  I started some STM today and today as the 2nd iontophoresis patch with dexamethasone   Patient is a 89 y.o. male who was seen today for physical therapy evaluation and treatment for left hip pain he is really struggling with getting up from sitting and walking > 80 feet due to pain and fatigue in the left hip.  Very tender in the left GT area, also c/o a pinch in the left groin.   OBJECTIVE IMPAIRMENTS: Abnormal gait, cardiopulmonary status limiting activity, decreased activity tolerance, decreased balance,  decreased coordination, decreased endurance, decreased mobility, difficulty walking, decreased ROM, decreased strength, increased muscle spasms, impaired flexibility, improper body mechanics, postural dysfunction, and pain.   REHAB POTENTIAL: Good  CLINICAL DECISION MAKING: Stable/uncomplicated  EVALUATION COMPLEXITY: Low   GOALS: Goals reviewed with patient? Yes  SHORT TERM GOALS: Target date: 10/08/23 Independent with initial HEP Baseline: Goal status: ,met 10/06/23  LONG TERM GOALS: Target date: 12/17/23  Independent with advanced HEP Baseline:  Goal status: INITIAL  2.  Walk most community distances without AD Baseline:  Goal status: ongoing 10/13/23  3.  Decrease pain 50% Baseline:  Goal status: INITIAL  4.  Decrease TUG to 13 seconds Baseline:  Goal status: INITIAL  5.  Decrease 5XSTS to 32 seconds Baseline:  Goal status: INITIAL  6.  Walk 400 feet in 3 minutes Baseline:  Goal status: INITIAL   PLAN:  PT FREQUENCY: 1-2x/week  PT DURATION: 12 weeks  PLANNED INTERVENTIONS:  97164- PT Re-evaluation, 97110-Therapeutic exercises, 97530- Therapeutic activity, 97112- Neuromuscular re-education, 97535- Self Care, [id number]- Manual therapy, 97116- Gait training, [phone]- Ionotophoresis 4mg /ml Dexamethasone , Patient/Family education, Balance training, Stair training, Taping, Dry Needling, Joint mobilization, Cryotherapy, and Moist heat  PLAN FOR NEXT SESSION: Will continue with ionto and STM   OBADIAH OZELL ORN, PT 10/29/2023, 1:14 PM

## 2023-10-29 NOTE — Patient Instructions (Signed)

## 2023-10-30 ENCOUNTER — Telehealth: Payer: Self-pay

## 2023-10-30 DIAGNOSIS — M25552 Pain in left hip: Secondary | ICD-10-CM | POA: Diagnosis not present

## 2023-10-30 NOTE — Telephone Encounter (Signed)
   Pre-operative Risk Assessment    Patient Name: Connor [name]  DOB: 11-10-1933 MRN: [id number]   Date of last office visit: 10/29/23 BRAIN CRENSHAW, MD Date of next office visit: NONE   Request for Surgical Clearance    Procedure:  LT TOTAL HIP ARTHROPLASTY  Date of Surgery:  Clearance TBD                                Surgeon:  TORIBIO HIGASHI, MD Surgeon's Group or Practice Name:  [facility] Phone number:  [phone]   EXT 3134 Fax number:  [phone]   Type of Clearance Requested:   - Medical  - Pharmacy:  Hold Aspirin      Type of Anesthesia:  Spinal   Additional requests/questions:    SignedLucie DELENA Ku   10/30/2023, 2:45 PM

## 2023-10-31 NOTE — Telephone Encounter (Signed)
   Name: Connor [name]  DOB: [birth year]  MRN: [id number]   Primary Cardiologist: Redell Shallow, MD  Chart reviewed as part of pre-operative protocol coverage. Patient was contacted 10/31/2023 in reference to pre-operative risk assessment for pending surgery as outlined below.  Connor [name] was last seen on 10/29/2023 by Dr. Shallow.  Since that day, Connor [name] has done well without new cardiac issues.  Therefore, based on ACC/AHA guidelines, the patient would be at acceptable risk for the planned procedure without further cardiovascular testing. He is advised to hold ASA for 7 days prior to the procedure.  He verbalized understanding.   The patient was advised that if he develops new symptoms prior to surgery to contact our office to arrange for a follow-up visit, and he verbalized understanding.  I will route this recommendation to the requesting party via Epic fax function and remove from pre-op  pool. Please call with questions.  Lamarr Satterfield, NP 10/31/2023, 11:58 AM

## 2023-11-03 ENCOUNTER — Encounter: Payer: Self-pay | Admitting: Family Medicine

## 2023-11-04 ENCOUNTER — Ambulatory Visit: Attending: Family Medicine | Admitting: Physical Therapy

## 2023-11-04 ENCOUNTER — Encounter: Payer: Self-pay | Admitting: Physical Therapy

## 2023-11-04 DIAGNOSIS — R2681 Unsteadiness on feet: Secondary | ICD-10-CM | POA: Diagnosis not present

## 2023-11-04 DIAGNOSIS — R252 Cramp and spasm: Secondary | ICD-10-CM | POA: Diagnosis not present

## 2023-11-04 DIAGNOSIS — M25552 Pain in left hip: Secondary | ICD-10-CM | POA: Insufficient documentation

## 2023-11-04 DIAGNOSIS — R262 Difficulty in walking, not elsewhere classified: Secondary | ICD-10-CM | POA: Insufficient documentation

## 2023-11-04 DIAGNOSIS — M6281 Muscle weakness (generalized): Secondary | ICD-10-CM | POA: Insufficient documentation

## 2023-11-04 NOTE — Therapy (Signed)
 OUTPATIENT PHYSICAL THERAPY LOWER EXTREMITY TREATMENT   Patient Name: Connor [name] MRN: [id number] DOB:10/05/1933, 89 y.o., male Today's Date: 11/04/2023  END OF SESSION:  PT End of Session - 11/04/23 1359     Visit Number 5    Number of Visits 13    Date for PT Re-Evaluation 12/14/23    Authorization Type [employer] 4/12    PT Start Time 1353    PT Stop Time 1439    PT Time Calculation (min) 46 min    Activity Tolerance Patient tolerated treatment well    Behavior During Therapy [facility] for tasks assessed/performed          Past Medical History:  Diagnosis Date   Allergy 1985   Benign localized prostatic hyperplasia with lower urinary tract symptoms (LUTS)    urologist-- dr matilda   Cancer [facility]) 07/2018   prostate   Cataract 12/2012   CHF (congestive heart failure) (HCC) ?   Chronic anticoagulation    Xarelto  for afib   Chronic constipation    Chronic cystitis    Clotting disorder (HCC)    GERD (gastroesophageal reflux disease)    Gross hematuria    Heart murmur ?   History of DVT of lower extremity 06/08/2010   right femoral dvt dx post op total hip 05-08-2010   History of gastric ulcer    remote   History of kidney stones    one stone. remote hx   Hypercholesteremia    Hyperlipidemia ?   Hypertension    Dx by Dr. Salena Skill around age  55    Macular degeneration    dry   NICM (nonischemic cardiomyopathy) (HCC)    per echo 06-11-2017,  ef 45-50% with diffuse hypokinesis,  G1DD   OSA on CPAP 2017   compliant with CPAP.  managed by Dr Jude.   Osteoarthritis    Other urethral stricture, male, meatal    chronic;  pt self dilates   Paroxysmal atrial fibrillation [facility]) cardiologist-- dr kelsie   first dx 2009/   a. documented by Dr Comer on office EKG 9/12. b. maintained on Tikosyn  and Xarelto . c. s/p DCCV 's.  d.  x3  EP w/ ablation atrial fib last one 12-05-2016   Prediabetes    Premature ventricular contraction    Presence of Watchman left atrial  appendage closure device 05/30/2022   Watchman FLX 24mm by Dr. Cindie   PVC's (premature ventricular contractions)    RA (rheumatoid arthritis) [facility])    rheumatologist--- Dr LORELEI Jacob,  treated with Humira   RBBB (right bundle branch block)    Hx of a fib, ablation Aug 2018   Rheumatoid arthritis (HCC)    Sleep apnea    Ulcer    Vitamin D  deficiency    Wears hearing aid in both ears    Past Surgical History:  Procedure Laterality Date   ABLATION OF DYSRHYTHMIC FOCUS  08/29/2015   ATRIAL FIBRILLATION ABLATION  12/05/2016   ATRIAL FIBRILLATION ABLATION N/A 12/05/2016   Procedure: Atrial Fibrillation Ablation;  Surgeon: kelsie Lynwood, MD;  Location: [facility] INVASIVE CV LAB;  Service: Cardiovascular;  Laterality: N/A;   ATRIAL FIBRILLATION ABLATION N/A 02/29/2020   Procedure: ATRIAL FIBRILLATION ABLATION;  Surgeon: kelsie Lynwood, MD;  Location: [facility] INVASIVE CV LAB;  Service: Cardiovascular;  Laterality: N/A;   ATRIAL FLUTTER ABLATION  09/2010   by MILUS EDWARDS Right 1990s   CARDIOVERSION N/A 10/28/2012   Procedure: CARDIOVERSION;  Surgeon: Lonni JONETTA Cash, MD;  Location: [facility] OR;  Service: Cardiovascular;  Laterality: N/A;   CATARACT EXTRACTION W/ INTRAOCULAR LENS  IMPLANT, BILATERAL  2019   CYSTOSCOPY WITH BIOPSY N/A 09/03/2018   Procedure: CYSTOSCOPY WITH FULGURATION [id number];  Surgeon: Matilda Senior, MD;  Location: [facility] ORS;  Service: Urology;  Laterality: N/A;  30 MINS   CYSTOSCOPY WITH FULGERATION N/A 01/23/2022   Procedure: CYSTOSCOPY WITH FULGERATION OF PROSTATIC URETHRA;  Surgeon: Matilda Senior, MD;  Location: [facility] ORS;  Service: Urology;  Laterality: N/A;  30 MINS   DENTAL SURGERY     ELECTROPHYSIOLOGIC STUDY N/A 08/29/2015   Procedure: Atrial Fibrillation Ablation;  Surgeon: Lynwood Rakers, MD;  Location: [facility] INVASIVE CV LAB;  Service: Cardiovascular;  Laterality: N/A;   EYE SURGERY     Cataracts   JOINT REPLACEMENT  1995, 1997   KNEE SURGERY Bilateral x3   1960s  & 1970s   open   LEFT ATRIAL APPENDAGE OCCLUSION N/A 05/30/2022   Procedure: LEFT ATRIAL APPENDAGE OCCLUSION;  Surgeon: Cindie Ole DASEN, MD;  Location: [facility] INVASIVE CV LAB;  Service: Cardiovascular;  Laterality: N/A;   LEFT HEART CATHETERIZATION WITH CORONARY ANGIOGRAM N/A 06/07/2011   Procedure: LEFT HEART CATHETERIZATION WITH CORONARY ANGIOGRAM;  Surgeon: Peter M Swaziland, MD;  Location: [facility] CATH LAB;  Service: Cardiovascular;  Laterality: N/A;    Normal coronary arteries,  low normal LVF   TEE WITHOUT CARDIOVERSION N/A 05/30/2022   Procedure: TRANSESOPHAGEAL ECHOCARDIOGRAM (TEE);  Surgeon: Cindie Ole DASEN, MD;  Location[facility] INVASIVE CV LAB;  Service: Cardiovascular;  Laterality: N/A;   TONSILLECTOMY AND ADENOIDECTOMY  age 15   TOTAL HIP ARTHROPLASTY Right 06/04/10    dr jane  @[facility]    TOTAL KNEE ARTHROPLASTY Bilateral right 1995;  left 1997   TRANSURETHRAL RESECTION OF PROSTATE  06-29-2002   dr humphries  @[facility]    TRANSURETHRAL RESECTION OF PROSTATE N/A 07/06/2018   Procedure: TRANSURETHRAL RESECTION OF THE PROSTATE (TURP);  Surgeon: Matilda Senior, MD;  Location: [facility];  Service: Urology;  Laterality: N/A;  45 MINS   Patient Active Problem List   Diagnosis Date Noted   Pain due to onychomycosis of toenails of both feet 07/03/2023   Atrial fibrillation (HCC) 05/30/2022   Presence of Watchman left atrial appendage closure device 05/30/2022   Hematuria 01/04/2022   Gross hematuria 01/04/2022   Secondary hypercoagulable state (HCC) 03/28/2020   Malignant neoplasm of prostate (HCC) 09/11/2018   Enlarged prostate with urinary obstruction 07/06/2018   Gastrocnemius strain, right, initial encounter 12/10/2017   Baker's cyst of knee, right 12/10/2017   Obesity 03/24/2017   Paroxysmal atrial fibrillation (HCC) 12/05/2016   Near syncope 04/20/2016   Leg hematoma, left, initial encounter 04/20/2016   Bleeding    Ecchymosis    Left hamstring muscle strain    Muscle  tear    RBBB 08/30/2015   Chronic systolic dysfunction of left ventricle 07/11/2014   OSA (obstructive sleep apnea) 04/07/2013   Multinodular goiter 01/20/2013   Cervical spondylosis without myelopathy 01/18/2013   Left ventricular dysfunction 12/17/2011   Low back pain 09/11/2011   Fatigue 05/22/2011   PVC (premature ventricular contraction) 01/18/2011   Persistent atrial fibrillation (HCC) 01/12/2011   Hyperlipidemia 08/02/2010   Hypertension 08/02/2010   Osteoarthritis 08/02/2010   BPH (benign prostatic hyperplasia) 08/02/2010   GERD (gastroesophageal reflux disease) 08/02/2010   h/o Atrial flutter (HCC) s/p ablation 2012     PCP: Copland, MD  REFERRING PROVIDER: Copland, MD  REFERRING DIAG: left hip pain  THERAPY DIAG:  Pain  in left hip  Muscle weakness (generalized)  Difficulty in walking, not elsewhere classified  Unsteadiness on feet  Rationale for Evaluation and Treatment: Rehabilitation  ONSET DATE: 09/15/23  SUBJECTIVE:   SUBJECTIVE STATEMENT: Patient reports that he saw the MD, feels like he will get a replacement in the near future, is waiting on some paperwork    Patient reports that about a month ago he was doing the recumbent bike like he normally does, reports that the next day had some left hip pain.  He reports that it has gotten progressively worse where he was having a lot of difficulty, reports a recent x-ray showed no bone on bone, some arthritis, has had to start using a SPC.  Had a cortisone injection in the left hip 3 weeks ago and it has helped the sharp pain.  PERTINENT HISTORY: Bilateral TKA, right THA, A-fib, Watchman placed PAIN:  Are you having pain? Yes: NPRS scale: 0/10 Pain location: anterior left hip and GT area Pain description: sharp, throbbing Aggravating factors: walking pain up to 8/10 Relieving factors: rest, sit down pain can be o/10  PRECAUTIONS: None  RED FLAGS: None   WEIGHT BEARING RESTRICTIONS: No  FALLS:   Has patient fallen in last 6 months? No  LIVING ENVIRONMENT: Lives with: lives with their family Lives in: House/apartment Stairs: Yes: Internal: 12 steps; can reach both Has following equipment at home: Single point cane and    OCCUPATION: retired  PLOF: Independent and some yardwork, was doing some gym activities, does go to [employer] often  PATIENT GOALS: less pain, walk better, better strength  NEXT MD VISIT: none scheduled  OBJECTIVE:  Note: Objective measures were completed at Evaluation unless otherwise noted.  DIAGNOSTIC FINDINGS: reports to me that the x-rays looked good  PATIENT SURVEYS:  LEFS 16.3%  COGNITION: Overall cognitive status: Within functional limits for tasks assessed     SENSATION: WFL  MUSCLE LENGTH: Mild tightness in the HS, tight calves  POSTURE: rounded shoulders, forward head, and decreased lumbar lordosis  PALPATION: Tender in the left GT area  LOWER EXTREMITY ROM:  Active ROM Right eval Left eval  Hip flexion  60  Hip extension  5  Hip abduction  11  Hip adduction    Hip internal rotation    Hip external rotation  5  Knee flexion    Knee extension    Ankle dorsiflexion    Ankle plantarflexion    Ankle inversion    Ankle eversion     (Blank rows = not tested) Cannot cross the left leg over the right, has to use hands and is painful  LOWER EXTREMITY MMT:  MMT Right eval Left eval  Hip flexion  3+  Hip extension  4-  Hip abduction  4-  Hip adduction  4  Hip internal rotation    Hip external rotation    Knee flexion    Knee extension    Ankle dorsiflexion    Ankle plantarflexion    Ankle inversion    Ankle eversion     (Blank rows = not tested)  LOWER EXTREMITY SPECIAL TESTS:  Hip special tests: Trendelenburg test: positive , Hip scouring test: negative, and Piriformis test: positive   FUNCTIONAL TESTS:  5 times sit to stand: 60 seconds, has to use hands Timed up and go (TUG): 21 seconds 3 minute walk test:  unable to go past 1 minute 80 feet, pain   GAIT: Distance walked: 80 feet Assistive device utilized: Single point  cane Level of assistance: Modified independence Comments: slow, very difficult with getting up, antalgic on the left                                                                                                                                TREATMENT DATE:  11/04/23 Nustep level 5 x 7 minutes 3# LAQ with some pain 3# marches with tband assist 3# standing marches 3# hip abduction 3# hip extension Green tband clamshell  Blue tband HS curls Ball b/n knees curls STM to the left hip with Tgun  10/29/23 Nustep level 5 x 6 minutes 25# leg curls Ball b/n knees squeeze Green tband clam shells 2# marches 2# hip abduction 2# hip extension STM with the T-gun to the left anterior hip and some into the glute and the ITB Ionto 80mA dose 4 hour patch 2nd treatment  10/13/23 2# marches seated 2# marches standing 2# hip abduction 2# hip extension Ball b/n knees squeeze Nustep level 5 x 6 minutes Did some skilled palpations to find what he was saying is hurting, it is not the tendon or the joint he is tender in the left quad area.  STM to this area Ionto 80mA dose to the left quad area mid  10/06/23 UBE level 4 x 6 minutes Nustep level 5 x 6 minutes 2.5# standing marches, hip abduction and extension On airex cone toe touch On airex ball toss 10# straight arm pulls Feet on ball K2C, rotation, bridges, isometric ab Ball b/n knees squeeze Blue tband clamshells STM with the T-gun to the left hip flexor, quad, adductor and glute  09/16/23 Evaluation    PATIENT EDUCATION:  Education details: POC/HEP Person educated: Patient Education method: Programmer, multimedia, Facilities manager, Verbal cues, and Handouts Education comprehension: verbalized understanding  HOME EXERCISE PROGRAM: Access Code: [email or web address] URL: [email or web address] Date: 09/16/2023 Prepared by:  Ozell Mainland  Exercises - Supine Piriformis Stretch Pulling Heel to Hip  - 2 x daily - 7 x weekly - 1 sets - 5 reps - 15 hold - Supine Hip Adductor Stretch  - 2 x daily - 7 x weekly - 1 sets - 5 reps - 15 hold - Modified Thomas Stretch  - 2 x daily - 7 x weekly - 1 sets - 5 reps - 15 hold  ASSESSMENT:  CLINICAL IMPRESSION: Patient continues to have pain with lifting the left leg up into car and bed.  He did see the MD last week and may get a THA in the near future.  I will continue to work on strength of the mms as he tolerates for the next 4-6 weeks  Patient is a 89 y.o. male who was seen today for physical therapy evaluation and treatment for left hip pain he is really struggling with getting up from sitting and walking > 80 feet due to pain and fatigue in the left hip.  Very tender in the left GT area, also c/o a pinch  in the left groin.   OBJECTIVE IMPAIRMENTS: Abnormal gait, cardiopulmonary status limiting activity, decreased activity tolerance, decreased balance, decreased coordination, decreased endurance, decreased mobility, difficulty walking, decreased ROM, decreased strength, increased muscle spasms, impaired flexibility, improper body mechanics, postural dysfunction, and pain.   REHAB POTENTIAL: Good  CLINICAL DECISION MAKING: Stable/uncomplicated  EVALUATION COMPLEXITY: Low   GOALS: Goals reviewed with patient? Yes  SHORT TERM GOALS: Target date: 10/08/23 Independent with initial HEP Baseline: Goal status: ,met 10/06/23  LONG TERM GOALS: Target date: 12/17/23  Independent with advanced HEP Baseline:  Goal status: INITIAL  2.  Walk most community distances without AD Baseline:  Goal status: ongoing 10/13/23  3.  Decrease pain 50% Baseline:  Goal status: ongoing 11/04/23  4.  Decrease TUG to 13 seconds Baseline:  Goal status: ongoing 11/04/23  5.  Decrease 5XSTS to 32 seconds Baseline:  Goal status: INITIAL  6.  Walk 400 feet in 3 minutes Baseline:  Goal  status: INITIAL   PLAN:  PT FREQUENCY: 1-2x/week  PT DURATION: 12 weeks  PLANNED INTERVENTIONS: 97164- PT Re-evaluation, 97110-Therapeutic exercises, 97530- Therapeutic activity, 97112- Neuromuscular re-education, 97535- Self Care, [id number]- Manual therapy, [phone]- Gait training, [phone]- Ionotophoresis 4mg /ml Dexamethasone , Patient/Family education, Balance training, Stair training, Taping, Dry Needling, Joint mobilization, Cryotherapy, and Moist heat  PLAN FOR NEXT SESSION: Will continue with exercises and STM   Bertis Hustead W, PT 11/04/2023, 2:00 PM

## 2023-11-24 ENCOUNTER — Ambulatory Visit: Admitting: Physical Therapy

## 2023-11-24 ENCOUNTER — Encounter: Payer: Self-pay | Admitting: Physical Therapy

## 2023-11-24 DIAGNOSIS — R262 Difficulty in walking, not elsewhere classified: Secondary | ICD-10-CM | POA: Diagnosis not present

## 2023-11-24 DIAGNOSIS — M25552 Pain in left hip: Secondary | ICD-10-CM

## 2023-11-24 DIAGNOSIS — R2681 Unsteadiness on feet: Secondary | ICD-10-CM | POA: Diagnosis not present

## 2023-11-24 DIAGNOSIS — M6281 Muscle weakness (generalized): Secondary | ICD-10-CM | POA: Diagnosis not present

## 2023-11-24 DIAGNOSIS — R252 Cramp and spasm: Secondary | ICD-10-CM

## 2023-11-24 NOTE — Therapy (Signed)
 OUTPATIENT PHYSICAL THERAPY LOWER EXTREMITY TREATMENT   Patient Name: Connor [name] MRN: [id number] DOB:[birth year], 89 y.o., male Today's Date: 11/24/2023  END OF SESSION:  PT End of Session - 11/24/23 1053     Visit Number 6    Number of Visits 13    Date for PT Re-Evaluation 12/14/23    Authorization Type [employer] 5/12    PT Start Time 1053    PT Stop Time 1140    PT Time Calculation (min) 47 min    Activity Tolerance Patient tolerated treatment well    Behavior During Therapy [facility] for tasks assessed/performed          Past Medical History:  Diagnosis Date   Allergy 1985   Benign localized prostatic hyperplasia with lower urinary tract symptoms (LUTS)    urologist-- dr matilda   Cancer [facility]) 07/2018   prostate   Cataract 12/2012   CHF (congestive heart failure) (HCC) ?   Chronic anticoagulation    Xarelto  for afib   Chronic constipation    Chronic cystitis    Clotting disorder (HCC)    GERD (gastroesophageal reflux disease)    Gross hematuria    Heart murmur ?   History of DVT of lower extremity 06/08/2010   right femoral dvt dx post op total hip 05-08-2010   History of gastric ulcer    remote   History of kidney stones    one stone. remote hx   Hypercholesteremia    Hyperlipidemia ?   Hypertension    Dx by Dr. Salena Skill around age  65    Macular degeneration    dry   NICM (nonischemic cardiomyopathy) (HCC)    per echo 06-11-2017,  ef 45-50% with diffuse hypokinesis,  G1DD   OSA on CPAP 2017   compliant with CPAP.  managed by Dr Jude.   Osteoarthritis    Other urethral stricture, male, meatal    chronic;  pt self dilates   Paroxysmal atrial fibrillation [facility]) cardiologist-- dr kelsie   first dx 2009/   a. documented by Dr Comer on office EKG 9/12. b. maintained on Tikosyn  and Xarelto . c. s/p DCCV 's.  d.  x3  EP w/ ablation atrial fib last one 12-05-2016   Prediabetes    Premature ventricular contraction    Presence of Watchman left atrial  appendage closure device 05/30/2022   Watchman FLX 24mm by Dr. Cindie   PVC's (premature ventricular contractions)    RA (rheumatoid arthritis) [facility])    rheumatologist--- Dr LORELEI Jacob,  treated with Humira   RBBB (right bundle branch block)    Hx of a fib, ablation Aug 2018   Rheumatoid arthritis (HCC)    Sleep apnea    Ulcer    Vitamin D  deficiency    Wears hearing aid in both ears    Past Surgical History:  Procedure Laterality Date   ABLATION OF DYSRHYTHMIC FOCUS  08/29/2015   ATRIAL FIBRILLATION ABLATION  12/05/2016   ATRIAL FIBRILLATION ABLATION N/A 12/05/2016   Procedure: Atrial Fibrillation Ablation;  Surgeon: kelsie Lynwood, MD;  Location: [facility] INVASIVE CV LAB;  Service: Cardiovascular;  Laterality: N/A;   ATRIAL FIBRILLATION ABLATION N/A 02/29/2020   Procedure: ATRIAL FIBRILLATION ABLATION;  Surgeon: kelsie Lynwood, MD;  Location: [facility] INVASIVE CV LAB;  Service: Cardiovascular;  Laterality: N/A;   ATRIAL FLUTTER ABLATION  09/2010   by MILUS EDWARDS Right 1990s   CARDIOVERSION N/A 10/28/2012   Procedure: CARDIOVERSION;  Surgeon: Lonni JONETTA Cash, MD;  Location: [facility] OR;  Service: Cardiovascular;  Laterality: N/A;   CATARACT EXTRACTION W/ INTRAOCULAR LENS  IMPLANT, BILATERAL  2019   CYSTOSCOPY WITH BIOPSY N/A 09/03/2018   Procedure: CYSTOSCOPY WITH FULGURATION [id number];  Surgeon: Matilda Senior, MD;  Location: [facility] ORS;  Service: Urology;  Laterality: N/A;  30 MINS   CYSTOSCOPY WITH FULGERATION N/A 01/23/2022   Procedure: CYSTOSCOPY WITH FULGERATION OF PROSTATIC URETHRA;  Surgeon: Matilda Senior, MD;  Location: [facility] ORS;  Service: Urology;  Laterality: N/A;  30 MINS   DENTAL SURGERY     ELECTROPHYSIOLOGIC STUDY N/A 08/29/2015   Procedure: Atrial Fibrillation Ablation;  Surgeon: Lynwood Rakers, MD;  Location: [facility] INVASIVE CV LAB;  Service: Cardiovascular;  Laterality: N/A;   EYE SURGERY     Cataracts   JOINT REPLACEMENT  1995, 1997   KNEE SURGERY Bilateral x3   1960s  & 1970s   open   LEFT ATRIAL APPENDAGE OCCLUSION N/A 05/30/2022   Procedure: LEFT ATRIAL APPENDAGE OCCLUSION;  Surgeon: Cindie Ole DASEN, MD;  Location: [facility] INVASIVE CV LAB;  Service: Cardiovascular;  Laterality: N/A;   LEFT HEART CATHETERIZATION WITH CORONARY ANGIOGRAM N/A 06/07/2011   Procedure: LEFT HEART CATHETERIZATION WITH CORONARY ANGIOGRAM;  Surgeon: Peter M Swaziland, MD;  Location: [facility] CATH LAB;  Service: Cardiovascular;  Laterality: N/A;    Normal coronary arteries,  low normal LVF   TEE WITHOUT CARDIOVERSION N/A 05/30/2022   Procedure: TRANSESOPHAGEAL ECHOCARDIOGRAM (TEE);  Surgeon: Cindie Ole DASEN, MD;  Location: [facility] INVASIVE CV LAB;  Service: Cardiovascular;  Laterality: N/A;   TONSILLECTOMY AND ADENOIDECTOMY  age 89   TOTAL HIP ARTHROPLASTY Right 06/04/10    dr jane  @[facility]    TOTAL KNEE ARTHROPLASTY Bilateral right 1995;  left 1997   TRANSURETHRAL RESECTION OF PROSTATE  06-29-2002   dr humphries  @[facility]    TRANSURETHRAL RESECTION OF PROSTATE N/A 07/06/2018   Procedure: TRANSURETHRAL RESECTION OF THE PROSTATE (TURP);  Surgeon: Matilda Senior, MD;  Location[facility];  Service: Urology;  Laterality: N/A;  45 MINS   Patient Active Problem List   Diagnosis Date Noted   Pain due to onychomycosis of toenails of both feet 07/03/2023   Atrial fibrillation (HCC) 05/30/2022   Presence of Watchman left atrial appendage closure device 05/30/2022   Hematuria 01/04/2022   Gross hematuria 01/04/2022   Secondary hypercoagulable state (HCC) 03/28/2020   Malignant neoplasm of prostate (HCC) 09/11/2018   Enlarged prostate with urinary obstruction 07/06/2018   Gastrocnemius strain, right, initial encounter 12/10/2017   Baker's cyst of knee, right 12/10/2017   Obesity 03/24/2017   Paroxysmal atrial fibrillation (HCC) 12/05/2016   Near syncope 04/20/2016   Leg hematoma, left, initial encounter 04/20/2016   Bleeding    Ecchymosis    Left hamstring muscle strain    Muscle  tear    RBBB 08/30/2015   Chronic systolic dysfunction of left ventricle 07/11/2014   OSA (obstructive sleep apnea) 04/07/2013   Multinodular goiter 01/20/2013   Cervical spondylosis without myelopathy 01/18/2013   Left ventricular dysfunction 12/17/2011   Low back pain 09/11/2011   Fatigue 05/22/2011   PVC (premature ventricular contraction) 01/18/2011   Persistent atrial fibrillation (HCC) 01/12/2011   Hyperlipidemia 08/02/2010   Hypertension 08/02/2010   Osteoarthritis 08/02/2010   BPH (benign prostatic hyperplasia) 08/02/2010   GERD (gastroesophageal reflux disease) 08/02/2010   h/o Atrial flutter (HCC) s/p ablation 2012     PCP: Copland, MD  REFERRING PROVIDER: Copland, MD  REFERRING DIAG: left hip pain  THERAPY DIAG:  Pain  in left hip  Muscle weakness (generalized)  Difficulty in walking, not elsewhere classified  Unsteadiness on feet  Cramp and spasm  Rationale for Evaluation and Treatment: Rehabilitation  ONSET DATE: 09/15/23  SUBJECTIVE:   SUBJECTIVE STATEMENT: Has scheduled a left THA for September 3.  He reports that he is really hurting with walking now.  Reports that has pain with walking a 5/10, reports that sometimes it just buckles   Patient reports that about a month ago he was doing the recumbent bike like he normally does, reports that the next day had some left hip pain.  He reports that it has gotten progressively worse where he was having a lot of difficulty, reports a recent x-ray showed no bone on bone, some arthritis, has had to start using a SPC.  Had a cortisone injection in the left hip 3 weeks ago and it has helped the sharp pain.  PERTINENT HISTORY: Bilateral TKA, right THA, A-fib, Watchman placed PAIN:  Are you having pain? Yes: NPRS scale: 0/10 Pain location: anterior left hip and GT area Pain description: sharp, throbbing Aggravating factors: walking pain up to 8/10 Relieving factors: rest, sit down pain can be  o/10  PRECAUTIONS: None  RED FLAGS: None   WEIGHT BEARING RESTRICTIONS: No  FALLS:  Has patient fallen in last 6 months? No  LIVING ENVIRONMENT: Lives with: lives with their family Lives in: House/apartment Stairs: Yes: Internal: 12 steps; can reach both Has following equipment at home: Single point cane and    OCCUPATION: retired  PLOF: Independent and some yardwork, was doing some gym activities, does go to [employer] often  PATIENT GOALS: less pain, walk better, better strength  NEXT MD VISIT: none scheduled  OBJECTIVE:  Note: Objective measures were completed at Evaluation unless otherwise noted.  DIAGNOSTIC FINDINGS: reports to me that the x-rays looked good  PATIENT SURVEYS:  LEFS 16.3%  COGNITION: Overall cognitive status: Within functional limits for tasks assessed     SENSATION: WFL  MUSCLE LENGTH: Mild tightness in the HS, tight calves  POSTURE: rounded shoulders, forward head, and decreased lumbar lordosis  PALPATION: Tender in the left GT area  LOWER EXTREMITY ROM:  Active ROM Right eval Left eval  Hip flexion  60  Hip extension  5  Hip abduction  11  Hip adduction    Hip internal rotation    Hip external rotation  5  Knee flexion    Knee extension    Ankle dorsiflexion    Ankle plantarflexion    Ankle inversion    Ankle eversion     (Blank rows = not tested) Cannot cross the left leg over the right, has to use hands and is painful  LOWER EXTREMITY MMT:  MMT Right eval Left eval  Hip flexion  3+  Hip extension  4-  Hip abduction  4-  Hip adduction  4  Hip internal rotation    Hip external rotation    Knee flexion    Knee extension    Ankle dorsiflexion    Ankle plantarflexion    Ankle inversion    Ankle eversion     (Blank rows = not tested)  LOWER EXTREMITY SPECIAL TESTS:  Hip special tests: Trendelenburg test: positive , Hip scouring test: negative, and Piriformis test: positive   FUNCTIONAL TESTS:  5 times sit to  stand: 60 seconds, has to use hands Timed up and go (TUG): 21 seconds 3 minute walk test: unable to go past 1 minute 80  feet, pain   GAIT: Distance walked: 80 feet Assistive device utilized: Single point cane Level of assistance: Modified independence Comments: slow, very difficult with getting up, antalgic on the left                                                                                                                                TREATMENT DATE:  11/24/23 Nustep level 4 x 8 minutes Standing 10# rows and straight arm pulls 35# HS curls 3 x10 5# leg extension 3x10 2# standing left hip flexion, abduction extension Ball b/n knees squeeze Green tband clamshells STM to the left hip area  11/04/23 Nustep level 5 x 7 minutes 3# LAQ with some pain 3# marches with tband assist 3# standing marches 3# hip abduction 3# hip extension Green tband clamshell  Blue tband HS curls Ball b/n knees curls STM to the left hip with Tgun  10/29/23 Nustep level 5 x 6 minutes 25# leg curls Ball b/n knees squeeze Green tband clam shells 2# marches 2# hip abduction 2# hip extension STM with the T-gun to the left anterior hip and some into the glute and the ITB Ionto 80mA dose 4 hour patch 2nd treatment  10/13/23 2# marches seated 2# marches standing 2# hip abduction 2# hip extension Ball b/n knees squeeze Nustep level 5 x 6 minutes Did some skilled palpations to find what he was saying is hurting, it is not the tendon or the joint he is tender in the left quad area.  STM to this area Ionto 80mA dose to the left quad area mid  10/06/23 UBE level 4 x 6 minutes Nustep level 5 x 6 minutes 2.5# standing marches, hip abduction and extension On airex cone toe touch On airex ball toss 10# straight arm pulls Feet on ball K2C, rotation, bridges, isometric ab Ball b/n knees squeeze Blue tband clamshells STM with the T-gun to the left hip flexor, quad, adductor and  glute  09/16/23 Evaluation    PATIENT EDUCATION:  Education details: POC/HEP Person educated: Patient Education method: Programmer, multimedia, Facilities manager, Verbal cues, and Handouts Education comprehension: verbalized understanding  HOME EXERCISE PROGRAM: Access Code: [email or web address] URL: [email or web address] Date: 09/16/2023 Prepared by: Ozell Mainland  Exercises - Supine Piriformis Stretch Pulling Heel to Hip  - 2 x daily - 7 x weekly - 1 sets - 5 reps - 15 hold - Supine Hip Adductor Stretch  - 2 x daily - 7 x weekly - 1 sets - 5 reps - 15 hold - Modified Thomas Stretch  - 2 x daily - 7 x weekly - 1 sets - 5 reps - 15 hold  ASSESSMENT:  CLINICAL IMPRESSION: Patient continues to have pain in the left hip, reports getting worse, pain is with walking now, not just when raising the leg up, I continued to try to strengthen around the hip and did some arms and core as well.  He does have the THA scheduled for  01/07/24.  Patient is a 89 y.o. male who was seen today for physical therapy evaluation and treatment for left hip pain he is really struggling with getting up from sitting and walking > 80 feet due to pain and fatigue in the left hip.  Very tender in the left GT area, also c/o a pinch in the left groin.   OBJECTIVE IMPAIRMENTS: Abnormal gait, cardiopulmonary status limiting activity, decreased activity tolerance, decreased balance, decreased coordination, decreased endurance, decreased mobility, difficulty walking, decreased ROM, decreased strength, increased muscle spasms, impaired flexibility, improper body mechanics, postural dysfunction, and pain.   REHAB POTENTIAL: Good  CLINICAL DECISION MAKING: Stable/uncomplicated  EVALUATION COMPLEXITY: Low   GOALS: Goals reviewed with patient? Yes  SHORT TERM GOALS: Target date: 10/08/23 Independent with initial HEP Baseline: Goal status: ,met 10/06/23  LONG TERM GOALS: Target date: 12/17/23  Independent with advanced  HEP Baseline:  Goal status: progressing 11/24/23  2.  Walk most community distances without AD Baseline:  Goal status: ongoing 10/13/23  3.  Decrease pain 50% Baseline:  Goal status: ongoing 11/24/23  4.  Decrease TUG to 13 seconds Baseline:  Goal status: ongoing 11/24/23  5.  Decrease 5XSTS to 32 seconds Baseline:  Goal status: ongoing 11/24/23  6.  Walk 400 feet in 3 minutes Baseline:  Goal status: ongoing 11/24/23   PLAN:  PT FREQUENCY: 1-2x/week  PT DURATION: 12 weeks  PLANNED INTERVENTIONS: 97164- PT Re-evaluation, 97110-Therapeutic exercises, 97530- Therapeutic activity, 97112- Neuromuscular re-education, 97535- Self Care, [id number]- Manual therapy, [phone]- Gait training, [phone]- Ionotophoresis 4mg /ml Dexamethasone , Patient/Family education, Balance training, Stair training, Taping, Dry Needling, Joint mobilization, Cryotherapy, and Moist heat  PLAN FOR NEXT SESSION: Will discuss whether we continue PT or do we D/C and wait for the THA at the next visit   Saim Almanza W, PT 11/24/2023, 10:56 AM

## 2023-12-02 ENCOUNTER — Encounter: Payer: Self-pay | Admitting: Physical Therapy

## 2023-12-02 ENCOUNTER — Ambulatory Visit: Admitting: Physical Therapy

## 2023-12-02 DIAGNOSIS — M25552 Pain in left hip: Secondary | ICD-10-CM

## 2023-12-02 DIAGNOSIS — M6281 Muscle weakness (generalized): Secondary | ICD-10-CM | POA: Diagnosis not present

## 2023-12-02 DIAGNOSIS — R2681 Unsteadiness on feet: Secondary | ICD-10-CM | POA: Diagnosis not present

## 2023-12-02 DIAGNOSIS — R252 Cramp and spasm: Secondary | ICD-10-CM

## 2023-12-02 DIAGNOSIS — R262 Difficulty in walking, not elsewhere classified: Secondary | ICD-10-CM

## 2023-12-02 NOTE — Therapy (Signed)
 OUTPATIENT PHYSICAL THERAPY LOWER EXTREMITY TREATMENT   Patient Name: Connor [name] MRN: [id number] DOB:1934-04-18, 89 y.o., male Today's Date: 12/02/2023  END OF SESSION:  PT End of Session - 12/02/23 1310     Visit Number 7    Number of Visits 13    Date for PT Re-Evaluation 12/14/23    Authorization Type [employer] 6/12    PT Start Time 1310    PT Stop Time 1400    PT Time Calculation (min) 50 min    Activity Tolerance Patient tolerated treatment well    Behavior During Therapy [facility] for tasks assessed/performed          Past Medical History:  Diagnosis Date   Allergy 1985   Benign localized prostatic hyperplasia with lower urinary tract symptoms (LUTS)    urologist-- dr matilda   Cancer [facility]) 07/2018   prostate   Cataract 12/2012   CHF (congestive heart failure) (HCC) ?   Chronic anticoagulation    Xarelto  for afib   Chronic constipation    Chronic cystitis    Clotting disorder (HCC)    GERD (gastroesophageal reflux disease)    Gross hematuria    Heart murmur ?   History of DVT of lower extremity 06/08/2010   right femoral dvt dx post op total hip 05-08-2010   History of gastric ulcer    remote   History of kidney stones    one stone. remote hx   Hypercholesteremia    Hyperlipidemia ?   Hypertension    Dx by Dr. Salena Skill around age  25    Macular degeneration    dry   NICM (nonischemic cardiomyopathy) (HCC)    per echo 06-11-2017,  ef 45-50% with diffuse hypokinesis,  G1DD   OSA on CPAP 2017   compliant with CPAP.  managed by Dr Jude.   Osteoarthritis    Other urethral stricture, male, meatal    chronic;  pt self dilates   Paroxysmal atrial fibrillation [facility]) cardiologist-- dr kelsie   first dx 2009/   a. documented by Dr Comer on office EKG 9/12. b. maintained on Tikosyn  and Xarelto . c. s/p DCCV 's.  d.  x3  EP w/ ablation atrial fib last one 12-05-2016   Prediabetes    Premature ventricular contraction    Presence of Watchman left atrial  appendage closure device 05/30/2022   Watchman FLX 24mm by Dr. Cindie   PVC's (premature ventricular contractions)    RA (rheumatoid arthritis) [facility])    rheumatologist--- Dr LORELEI Jacob,  treated with Humira   RBBB (right bundle branch block)    Hx of a fib, ablation Aug 2018   Rheumatoid arthritis (HCC)    Sleep apnea    Ulcer    Vitamin D  deficiency    Wears hearing aid in both ears    Past Surgical History:  Procedure Laterality Date   ABLATION OF DYSRHYTHMIC FOCUS  08/29/2015   ATRIAL FIBRILLATION ABLATION  12/05/2016   ATRIAL FIBRILLATION ABLATION N/A 12/05/2016   Procedure: Atrial Fibrillation Ablation;  Surgeon: kelsie Lynwood, MD;  Location: [facility] INVASIVE CV LAB;  Service: Cardiovascular;  Laterality: N/A;   ATRIAL FIBRILLATION ABLATION N/A 02/29/2020   Procedure: ATRIAL FIBRILLATION ABLATION;  Surgeon: kelsie Lynwood, MD;  Location: [facility] INVASIVE CV LAB;  Service: Cardiovascular;  Laterality: N/A;   ATRIAL FLUTTER ABLATION  09/2010   by MILUS EDWARDS Right 1990s   CARDIOVERSION N/A 10/28/2012   Procedure: CARDIOVERSION;  Surgeon: Lonni JONETTA Cash, MD;  Location: [facility] OR;  Service: Cardiovascular;  Laterality: N/A;   CATARACT EXTRACTION W/ INTRAOCULAR LENS  IMPLANT, BILATERAL  2019   CYSTOSCOPY WITH BIOPSY N/A 09/03/2018   Procedure: CYSTOSCOPY WITH FULGURATION [id number];  Surgeon: Matilda Senior, MD;  Location: [facility] ORS;  Service: Urology;  Laterality: N/A;  30 MINS   CYSTOSCOPY WITH FULGERATION N/A 01/23/2022   Procedure: CYSTOSCOPY WITH FULGERATION OF PROSTATIC URETHRA;  Surgeon: Matilda Senior, MD;  Location: [facility] ORS;  Service: Urology;  Laterality: N/A;  30 MINS   DENTAL SURGERY     ELECTROPHYSIOLOGIC STUDY N/A 08/29/2015   Procedure: Atrial Fibrillation Ablation;  Surgeon: Lynwood Rakers, MD;  Location: [facility] INVASIVE CV LAB;  Service: Cardiovascular;  Laterality: N/A;   EYE SURGERY     Cataracts   JOINT REPLACEMENT  1995, 1997   KNEE SURGERY Bilateral x3   1960s  & 1970s   open   LEFT ATRIAL APPENDAGE OCCLUSION N/A 05/30/2022   Procedure: LEFT ATRIAL APPENDAGE OCCLUSION;  Surgeon: Cindie Ole DASEN, MD;  Location: [facility] INVASIVE CV LAB;  Service: Cardiovascular;  Laterality: N/A;   LEFT HEART CATHETERIZATION WITH CORONARY ANGIOGRAM N/A 06/07/2011   Procedure: LEFT HEART CATHETERIZATION WITH CORONARY ANGIOGRAM;  Surgeon: Peter M Swaziland, MD;  Location: [facility] CATH LAB;  Service: Cardiovascular;  Laterality: N/A;    Normal coronary arteries,  low normal LVF   TEE WITHOUT CARDIOVERSION N/A 05/30/2022   Procedure: TRANSESOPHAGEAL ECHOCARDIOGRAM (TEE);  Surgeon: Cindie Ole DASEN, MD;  Location: [facility] INVASIVE CV LAB;  Service: Cardiovascular;  Laterality: N/A;   TONSILLECTOMY AND ADENOIDECTOMY  age 16   TOTAL HIP ARTHROPLASTY Right 06/04/10    dr jane  @[facility]    TOTAL KNEE ARTHROPLASTY Bilateral right 1995;  left 1997   TRANSURETHRAL RESECTION OF PROSTATE  06-29-2002   dr humphries  @[facility]    TRANSURETHRAL RESECTION OF PROSTATE N/A 07/06/2018   Procedure: TRANSURETHRAL RESECTION OF THE PROSTATE (TURP);  Surgeon: Matilda Senior, MD;  Location: [facility];  Service: Urology;  Laterality: N/A;  45 MINS   Patient Active Problem List   Diagnosis Date Noted   Pain due to onychomycosis of toenails of both feet 07/03/2023   Atrial fibrillation (HCC) 05/30/2022   Presence of Watchman left atrial appendage closure device 05/30/2022   Hematuria 01/04/2022   Gross hematuria 01/04/2022   Secondary hypercoagulable state (HCC) 03/28/2020   Malignant neoplasm of prostate (HCC) 09/11/2018   Enlarged prostate with urinary obstruction 07/06/2018   Gastrocnemius strain, right, initial encounter 12/10/2017   Baker's cyst of knee, right 12/10/2017   Obesity 03/24/2017   Paroxysmal atrial fibrillation (HCC) 12/05/2016   Near syncope 04/20/2016   Leg hematoma, left, initial encounter 04/20/2016   Bleeding    Ecchymosis    Left hamstring muscle strain    Muscle  tear    RBBB 08/30/2015   Chronic systolic dysfunction of left ventricle 07/11/2014   OSA (obstructive sleep apnea) 04/07/2013   Multinodular goiter 01/20/2013   Cervical spondylosis without myelopathy 01/18/2013   Left ventricular dysfunction 12/17/2011   Low back pain 09/11/2011   Fatigue 05/22/2011   PVC (premature ventricular contraction) 01/18/2011   Persistent atrial fibrillation (HCC) 01/12/2011   Hyperlipidemia 08/02/2010   Hypertension 08/02/2010   Osteoarthritis 08/02/2010   BPH (benign prostatic hyperplasia) 08/02/2010   GERD (gastroesophageal reflux disease) 08/02/2010   h/o Atrial flutter (HCC) s/p ablation 2012     PCP: Copland, MD  REFERRING PROVIDER: Copland, MD  REFERRING DIAG: left hip pain  THERAPY DIAG:  Pain  in left hip  Muscle weakness (generalized)  Difficulty in walking, not elsewhere classified  Unsteadiness on feet  Cramp and spasm  Rationale for Evaluation and Treatment: Rehabilitation  ONSET DATE: 09/15/23  SUBJECTIVE:   SUBJECTIVE STATEMENT: Has scheduled a left THA for September 3.  Continues to hurt and have difficulty walking.   Patient reports that about a month ago he was doing the recumbent bike like he normally does, reports that the next day had some left hip pain.  He reports that it has gotten progressively worse where he was having a lot of difficulty, reports a recent x-ray showed no bone on bone, some arthritis, has had to start using a SPC.  Had a cortisone injection in the left hip 3 weeks ago and it has helped the sharp pain.  PERTINENT HISTORY: Bilateral TKA, right THA, A-fib, Watchman placed PAIN:  Are you having pain? Yes: NPRS scale: 0/10 Pain location: anterior left hip and GT area Pain description: sharp, throbbing Aggravating factors: walking pain up to 8/10 Relieving factors: rest, sit down pain can be o/10  PRECAUTIONS: None  RED FLAGS: None   WEIGHT BEARING RESTRICTIONS: No  FALLS:  Has patient  fallen in last 6 months? No  LIVING ENVIRONMENT: Lives with: lives with their family Lives in: House/apartment Stairs: Yes: Internal: 12 steps; can reach both Has following equipment at home: Single point cane and    OCCUPATION: retired  PLOF: Independent and some yardwork, was doing some gym activities, does go to [employer] often  PATIENT GOALS: less pain, walk better, better strength  NEXT MD VISIT: none scheduled  OBJECTIVE:  Note: Objective measures were completed at Evaluation unless otherwise noted.  DIAGNOSTIC FINDINGS: reports to me that the x-rays looked good  PATIENT SURVEYS:  LEFS 16.3%  COGNITION: Overall cognitive status: Within functional limits for tasks assessed     SENSATION: WFL  MUSCLE LENGTH: Mild tightness in the HS, tight calves  POSTURE: rounded shoulders, forward head, and decreased lumbar lordosis  PALPATION: Tender in the left GT area  LOWER EXTREMITY ROM:  Active ROM Right eval Left eval  Hip flexion  60  Hip extension  5  Hip abduction  11  Hip adduction    Hip internal rotation    Hip external rotation  5  Knee flexion    Knee extension    Ankle dorsiflexion    Ankle plantarflexion    Ankle inversion    Ankle eversion     (Blank rows = not tested) Cannot cross the left leg over the right, has to use hands and is painful  LOWER EXTREMITY MMT:  MMT Right eval Left eval  Hip flexion  3+  Hip extension  4-  Hip abduction  4-  Hip adduction  4  Hip internal rotation    Hip external rotation    Knee flexion    Knee extension    Ankle dorsiflexion    Ankle plantarflexion    Ankle inversion    Ankle eversion     (Blank rows = not tested)  LOWER EXTREMITY SPECIAL TESTS:  Hip special tests: Trendelenburg test: positive , Hip scouring test: negative, and Piriformis test: positive   FUNCTIONAL TESTS:  5 times sit to stand: 60 seconds, has to use hands Timed up and go (TUG): 21 seconds 3 minute walk test: unable to go  past 1 minute 80 feet, pain   GAIT: Distance walked: 80 feet Assistive device utilized: Single point cane Level of assistance:  Modified independence Comments: slow, very difficult with getting up, antalgic on the left                                                                                                                                TREATMENT DATE:  12/02/23 Leg curls 35# 2x12 Leg extension 5# 2 x10 10# straight arm pulls NuStep level 5 x 6 minutes Ball b/n knees squeeze Black tband clamshells Standing 2# marches 2# hip abduction 2# hip extension Discussion on movements but not pain, walking but not pain and activities up to the surgery  11/24/23 Nustep level 4 x 8 minutes Standing 10# rows and straight arm pulls 35# HS curls 3 x10 5# leg extension 3x10 2# standing left hip flexion, abduction extension Ball b/n knees squeeze Green tband clamshells STM to the left hip area  11/04/23 Nustep level 5 x 7 minutes 3# LAQ with some pain 3# marches with tband assist 3# standing marches 3# hip abduction 3# hip extension Green tband clamshell  Blue tband HS curls Ball b/n knees curls STM to the left hip with Tgun  10/29/23 Nustep level 5 x 6 minutes 25# leg curls Ball b/n knees squeeze Green tband clam shells 2# marches 2# hip abduction 2# hip extension STM with the T-gun to the left anterior hip and some into the glute and the ITB Ionto 80mA dose 4 hour patch 2nd treatment  10/13/23 2# marches seated 2# marches standing 2# hip abduction 2# hip extension Ball b/n knees squeeze Nustep level 5 x 6 minutes Did some skilled palpations to find what he was saying is hurting, it is not the tendon or the joint he is tender in the left quad area.  STM to this area Ionto 80mA dose to the left quad area mid  10/06/23 UBE level 4 x 6 minutes Nustep level 5 x 6 minutes 2.5# standing marches, hip abduction and extension On airex cone toe touch On airex ball toss 10#  straight arm pulls Feet on ball K2C, rotation, bridges, isometric ab Ball b/n knees squeeze Blue tband clamshells STM with the T-gun to the left hip flexor, quad, adductor and glute  09/16/23 Evaluation    PATIENT EDUCATION:  Education details: POC/HEP Person educated: Patient Education method: Programmer, multimedia, Facilities manager, Verbal cues, and Handouts Education comprehension: verbalized understanding  HOME EXERCISE PROGRAM: Access Code: [email or web address] URL: [email or web address] Date: 09/16/2023 Prepared by: Ozell Mainland  Exercises - Supine Piriformis Stretch Pulling Heel to Hip  - 2 x daily - 7 x weekly - 1 sets - 5 reps - 15 hold - Supine Hip Adductor Stretch  - 2 x daily - 7 x weekly - 1 sets - 5 reps - 15 hold - Modified Thomas Stretch  - 2 x daily - 7 x weekly - 1 sets - 5 reps - 15 hold  ASSESSMENT:  CLINICAL IMPRESSION: Patient continues to have pain in the left hip, he is struggling more with  walking due to the pain.   He does have the THA scheduled for 01/07/24.  We decided that today would be the last visit and he needs to stay active but avoid pain up until his surgery.    Patient is a 89 y.o. male who was seen today for physical therapy evaluation and treatment for left hip pain he is really struggling with getting up from sitting and walking > 80 feet due to pain and fatigue in the left hip.  Very tender in the left GT area, also c/o a pinch in the left groin.   OBJECTIVE IMPAIRMENTS: Abnormal gait, cardiopulmonary status limiting activity, decreased activity tolerance, decreased balance, decreased coordination, decreased endurance, decreased mobility, difficulty walking, decreased ROM, decreased strength, increased muscle spasms, impaired flexibility, improper body mechanics, postural dysfunction, and pain.   REHAB POTENTIAL: Good  CLINICAL DECISION MAKING: Stable/uncomplicated  EVALUATION COMPLEXITY: Low   GOALS: Goals reviewed with patient? Yes  SHORT  TERM GOALS: Target date: 10/08/23 Independent with initial HEP Baseline: Goal status: ,met 10/06/23  LONG TERM GOALS: Target date: 12/17/23  Independent with advanced HEP Baseline:  Goal status: met 12/02/23  2.  Walk most community distances without AD Baseline:  Goal status: not met  3.  Decrease pain 50% Baseline:  Goal status: not met  4.  Decrease TUG to 13 seconds Baseline:  Goal status: not met  5.  Decrease 5XSTS to 32 seconds Baseline:  Goal status: not met  6.  Walk 400 feet in 3 minutes Baseline:  Goal status: not met   PLAN:  PT FREQUENCY: 1-2x/week  PT DURATION: 12 weeks  PLANNED INTERVENTIONS: 97164- PT Re-evaluation, 97110-Therapeutic exercises, 97530- Therapeutic activity, 97112- Neuromuscular re-education, 97535- Self Care, [id number]- Manual therapy, [phone]- Gait training, [phone]- Ionotophoresis 4mg /ml Dexamethasone , Patient/Family education, Balance training, Stair training, Taping, Dry Needling, Joint mobilization, Cryotherapy, and Moist heat  PLAN FOR NEXT SESSION: We decided to d/c as his hip pain is getting worse and he is having more difficulty, goals were not met as he is having more pain and has a THA scheduled for 01/07/24   OBADIAH OZELL ORN, PT 12/02/2023, 1:10 PM

## 2023-12-10 ENCOUNTER — Ambulatory Visit: Admitting: Podiatry

## 2023-12-10 DIAGNOSIS — M79675 Pain in left toe(s): Secondary | ICD-10-CM

## 2023-12-10 DIAGNOSIS — M79674 Pain in right toe(s): Secondary | ICD-10-CM

## 2023-12-10 DIAGNOSIS — B351 Tinea unguium: Secondary | ICD-10-CM

## 2023-12-10 NOTE — Progress Notes (Addendum)
 This patient presents to the office with chief complaint of long thick painful nails.  Patient says the nails are painful walking and wearing shoes.  This patient is unable to self treat.  This patient is unable to trim his  nails since he is unable to reach his  nails. Patient says he injured his right big toe about three weeks ago but now the swelling and redness has diminished. he presents to the office for preventative foot care services.  General Appearance  Alert, conversant and in no acute stress.  Vascular  Dorsalis pedis and posterior tibial  pulses are palpable  bilaterally.  Capillary return is within normal limits  bilaterally. Temperature is within normal limits  bilaterally.  Neurologic  Senn-Weinstein monofilament wire test within normal limits  bilaterally. Muscle power within normal limits bilaterally.  Nails Thick disfigured discolored nails with subungual debris  from hallux to fifth toes bilaterally. No evidence of bacterial infection or drainage bilaterally.  Orthopedic  No limitations of motion  feet .  No crepitus or effusions noted.  No bony pathology or digital deformities noted. HAV  B/L.  Skin  normotropic skin with no porokeratosis noted bilaterally.  No signs of infections or ulcers noted.     Onychomycosis  Nails  B/L.  Pain in right toes  Pain in left toes  Debridement of nails both feet followed trimming the nails with dremel tool.    RTC  10 weeks    Cordella Bold DPM  /sm

## 2023-12-18 DIAGNOSIS — M25552 Pain in left hip: Secondary | ICD-10-CM | POA: Diagnosis not present

## 2023-12-24 ENCOUNTER — Ambulatory Visit: Payer: Self-pay | Admitting: Emergency Medicine

## 2023-12-24 NOTE — H&P (View-Only) (Signed)
 TOTAL HIP ADMISSION H&P  Patient is admitted for left total hip arthroplasty.  Subjective:  Chief Complaint: left hip pain  HPI: Connor [name], 89 y.o. male, has a history of pain and functional disability in the left hip(s) due to arthritis and patient has failed non-surgical conservative treatments for greater than 12 weeks to include NSAID's and/or analgesics, corticosteriod injections, use of assistive devices, and activity modification.  Onset of symptoms was gradual starting less than 1 years ago with steadily worsening course since that time.The patient noted no past surgery on the left hip(s).  Patient currently rates pain in the left hip at 10 out of 10 with activity. Patient has night pain, worsening of pain with activity and weight bearing, pain that interfers with activities of daily living, and pain with passive range of motion. Patient has evidence of periarticular osteophytes and joint space narrowing by imaging studies. This condition presents safety issues increasing the risk of falls.  There is no current active infection.  Patient Active Problem List   Diagnosis Date Noted   Pain due to onychomycosis of toenails of both feet 07/03/2023   Atrial fibrillation (HCC) 05/30/2022   Presence of Watchman left atrial appendage closure device 05/30/2022   Hematuria 01/04/2022   Gross hematuria 01/04/2022   Secondary hypercoagulable state (HCC) 03/28/2020   Malignant neoplasm of prostate (HCC) 09/11/2018   Enlarged prostate with urinary obstruction 07/06/2018   Gastrocnemius strain, right, initial encounter 12/10/2017   Baker's cyst of knee, right 12/10/2017   Obesity 03/24/2017   Paroxysmal atrial fibrillation (HCC) 12/05/2016   Near syncope 04/20/2016   Leg hematoma, left, initial encounter 04/20/2016   Bleeding    Ecchymosis    Left hamstring muscle strain    Muscle tear    RBBB 08/30/2015   Chronic systolic dysfunction of left ventricle 07/11/2014   OSA (obstructive  sleep apnea) 04/07/2013   Multinodular goiter 01/20/2013   Cervical spondylosis without myelopathy 01/18/2013   Left ventricular dysfunction 12/17/2011   Low back pain 09/11/2011   Fatigue 05/22/2011   PVC (premature ventricular contraction) 01/18/2011   Persistent atrial fibrillation (HCC) 01/12/2011   Hyperlipidemia 08/02/2010   Hypertension 08/02/2010   Osteoarthritis 08/02/2010   BPH (benign prostatic hyperplasia) 08/02/2010   GERD (gastroesophageal reflux disease) 08/02/2010   h/o Atrial flutter (HCC) s/p ablation 2012    Past Medical History:  Diagnosis Date   Allergy 1985   Benign localized prostatic hyperplasia with lower urinary tract symptoms (LUTS)    urologist-- dr matilda   Cancer [facility]) 07/2018   prostate   Cataract 12/2012   CHF (congestive heart failure) (HCC) ?   Chronic anticoagulation    Xarelto  for afib   Chronic constipation    Chronic cystitis    Clotting disorder (HCC)    GERD (gastroesophageal reflux disease)    Gross hematuria    Heart murmur ?   History of DVT of lower extremity 06/08/2010   right femoral dvt dx post op total hip 05-08-2010   History of gastric ulcer    remote   History of kidney stones    one stone. remote hx   Hypercholesteremia    Hyperlipidemia ?   Hypertension    Dx by Dr. Salena Skill around age  66    Macular degeneration    dry   NICM (nonischemic cardiomyopathy) (HCC)    per echo 06-11-2017,  ef 45-50% with diffuse hypokinesis,  G1DD   OSA on CPAP 2017   compliant with  CPAP.  managed by Dr Jude.   Osteoarthritis    Other urethral stricture, male, meatal    chronic;  pt self dilates   Paroxysmal atrial fibrillation [facility]) cardiologist-- dr kelsie   first dx 2009/   a. documented by Dr Comer on office EKG 9/12. b. maintained on Tikosyn  and Xarelto . c. s/p DCCV 's.  d.  x3  EP w/ ablation atrial fib last one 12-05-2016   Prediabetes    Premature ventricular contraction    Presence of Watchman left atrial appendage  closure device 05/30/2022   Watchman FLX 24mm by Dr. Cindie   PVC's (premature ventricular contractions)    RA (rheumatoid arthritis) [facility])    rheumatologist--- Dr LORELEI Jacob,  treated with Humira   RBBB (right bundle branch block)    Hx of a fib, ablation Aug 2018   Rheumatoid arthritis (HCC)    Sleep apnea    Ulcer    Vitamin D  deficiency    Wears hearing aid in both ears     Past Surgical History:  Procedure Laterality Date   ABLATION OF DYSRHYTHMIC FOCUS  08/29/2015   ATRIAL FIBRILLATION ABLATION  12/05/2016   ATRIAL FIBRILLATION ABLATION N/A 12/05/2016   Procedure: Atrial Fibrillation Ablation;  Surgeon: kelsie Agent, MD;  Location: [facility] INVASIVE CV LAB;  Service: Cardiovascular;  Laterality: N/A;   ATRIAL FIBRILLATION ABLATION N/A 02/29/2020   Procedure: ATRIAL FIBRILLATION ABLATION;  Surgeon: kelsie Agent, MD;  Location: [facility] INVASIVE CV LAB;  Service: Cardiovascular;  Laterality: N/A;   ATRIAL FLUTTER ABLATION  09/2010   by MILUS EDWARDS Right 1990s   CARDIOVERSION N/A 10/28/2012   Procedure: CARDIOVERSION;  Surgeon: Lonni JONETTA Cash, MD;  Location: [facility] OR;  Service: Cardiovascular;  Laterality: N/A;   CATARACT EXTRACTION W/ INTRAOCULAR LENS  IMPLANT, BILATERAL  2019   CYSTOSCOPY WITH BIOPSY N/A 09/03/2018   Procedure: CYSTOSCOPY WITH FULGURATION CELESTA;  Surgeon: Matilda Senior, MD;  Location: [facility] ORS;  Service: Urology;  Laterality: N/A;  30 MINS   CYSTOSCOPY WITH FULGERATION N/A 01/23/2022   Procedure: CYSTOSCOPY WITH FULGERATION OF PROSTATIC URETHRA;  Surgeon: Matilda Senior, MD;  Location: [facility] ORS;  Service: Urology;  Laterality: N/A;  30 MINS   DENTAL SURGERY     ELECTROPHYSIOLOGIC STUDY N/A 08/29/2015   Procedure: Atrial Fibrillation Ablation;  Surgeon: Agent kelsie, MD;  Location: [facility] INVASIVE CV LAB;  Service: Cardiovascular;  Laterality: N/A;   EYE SURGERY     Cataracts   JOINT REPLACEMENT  1995, 1997   KNEE SURGERY Bilateral x3   1960s & 1970s    open   LEFT ATRIAL APPENDAGE OCCLUSION N/A 05/30/2022   Procedure: LEFT ATRIAL APPENDAGE OCCLUSION;  Surgeon: Cindie Ole DASEN, MD;  Location: [facility] INVASIVE CV LAB;  Service: Cardiovascular;  Laterality: N/A;   LEFT HEART CATHETERIZATION WITH CORONARY ANGIOGRAM N/A 06/07/2011   Procedure: LEFT HEART CATHETERIZATION WITH CORONARY ANGIOGRAM;  Surgeon: Peter M Swaziland, MD;  Location: [facility] CATH LAB;  Service: Cardiovascular;  Laterality: N/A;    Normal coronary arteries,  low normal LVF   TEE WITHOUT CARDIOVERSION N/A 05/30/2022   Procedure: TRANSESOPHAGEAL ECHOCARDIOGRAM (TEE);  Surgeon: Cindie Ole DASEN, MD;  Location: [facility] INVASIVE CV LAB;  Service: Cardiovascular;  Laterality: N/A;   TONSILLECTOMY AND ADENOIDECTOMY  age 13   TOTAL HIP ARTHROPLASTY Right 06/04/10    dr jane  @[facility]    TOTAL KNEE ARTHROPLASTY Bilateral right 1995;  left 1997   TRANSURETHRAL RESECTION OF PROSTATE  06-29-2002   dr humphries  @  [facility]   TRANSURETHRAL RESECTION OF PROSTATE N/A 07/06/2018   Procedure: TRANSURETHRAL RESECTION OF THE PROSTATE (TURP);  Surgeon: Matilda Senior, MD;  Location: [facility];  Service: Urology;  Laterality: N/A;  45 MINS    Current Outpatient Medications  Medication Sig Dispense Refill Last Dose/Taking   acetaminophen  (TYLENOL ) 500 MG tablet Take 1,000 mg by mouth 2 (two) times daily as needed for moderate pain.      aspirin  EC 81 MG tablet Take 1 tablet (81 mg total) by mouth daily. Swallow whole.      atorvastatin  (LIPITOR) 10 MG tablet Take 1 tablet (10 mg total) by mouth daily. 90 tablet 1    Carboxymethylcellulose Sodium (THERATEARS OP) Apply 1 drop to eye daily as needed (dry eyes).      cephALEXin  (KEFLEX ) 500 MG capsule Take 2,000 mg by mouth as directed. Dental appointments      Cholecalciferol  (VITAMIN D3) 5000 UNITS TABS Take 5,000 Units by mouth every morning.      clindamycin (CLEOCIN T) 1 % external solution Apply 1 Application topically daily.      Cranberry 1000  MG CAPS Take 4,000 mg by mouth 2 (two) times daily.      FLUZONE HIGH-DOSE 0.5 ML injection Inject 0.5 mLs into the muscle once.      HUMIRA PEN 40 MG/0.4ML PNKT Inject 40 mg as directed every 7 (seven) days. Every Tuesday  6    lisinopril  (ZESTRIL ) 40 MG tablet TAKE 1 TABLET BY MOUTH EVERY DAY 90 tablet 3    Melatonin 10 MG CHEW Chew 3 Pieces of gum by mouth at bedtime. gummy      metoprolol  succinate (TOPROL -XL) 50 MG 24 hr tablet Take 1 tablet (50 mg total) by mouth 2 (two) times daily. Take with or immediately following a meal. 180 tablet 3    Multiple Vitamins-Minerals (PRESERVISION AREDS 2) CAPS Take 1 capsule by mouth 2 (two) times a day.      pantoprazole  (PROTONIX ) 40 MG tablet TAKE 1 TABLET BY MOUTH EVERY DAY 90 tablet 3    senna (SENOKOT) 8.6 MG TABS tablet Take 2 tablets by mouth at bedtime. Per patient taking 2 tablets daily      solifenacin (VESICARE) 10 MG tablet Take 10 mg by mouth at bedtime.       SPIKEVAX syringe Inject 0.5 mLs into the muscle once.      tamsulosin  (FLOMAX ) 0.4 MG CAPS capsule Take 1 capsule (0.4 mg total) by mouth daily after supper. 30 capsule 5    Testosterone  20.25 MG/ACT (1.62%) GEL Apply 1 Pump topically at bedtime. Apply to shoulders      vitamin B-12 (CYANOCOBALAMIN ) 500 MCG tablet Take 500 mcg by mouth every evening.       No current facility-administered medications for this visit.   Allergies  Allergen Reactions   Penicillins Itching, Swelling and Other (See Comments)    Did it involve swelling of the face/tongue/throat, SOB, or low BP? No Did it involve sudden or severe rash/hives, skin peeling, or any reaction on the inside of your mouth or nose? No Did you need to seek medical attention at a hospital or doctor's office? No When did it last happen?      30 + years If all above answers are "NO", may proceed with cephalosporin use.      Social History   Tobacco Use   Smoking status: Former    Current packs/day: 0.00    Average packs/day:  1 pack/day  for 30.0 years (30.0 ttl pk-yrs)    Types: Cigarettes    Start date: 05/06/1948    Quit date: 05/06/1978    Years since quitting: 45.6   Smokeless tobacco: Never   Tobacco comments:    Former smoker 03/14/2021  Substance Use Topics   Alcohol  use: Yes    Alcohol /week: 1.0 standard drink of alcohol     Types: 1 Glasses of wine per week    Comment: occasionally wine and beer    Family History  Problem Relation Age of Onset   Heart attack Father    Heart disease Father    Obesity Father    Lung disease Mother    Arthritis Mother    Arrhythmia Sister    Atrial fibrillation Brother    Diabetes Neg Hx      Review of Systems  Musculoskeletal:  Positive for arthralgias.  All other systems reviewed and are negative.   Objective:  Physical Exam Constitutional:      General: He is not in acute distress.    Appearance: Normal appearance. He is normal weight.  HENT:     Head: Normocephalic and atraumatic.  Eyes:     Extraocular Movements: Extraocular movements intact.     Conjunctiva/sclera: Conjunctivae normal.     Pupils: Pupils are equal, round, and reactive to light.  Cardiovascular:     Rate and Rhythm: Normal rate and regular rhythm.     Pulses: Normal pulses.     Heart sounds: Normal heart sounds.  Pulmonary:     Effort: Pulmonary effort is normal. No respiratory distress.     Breath sounds: Normal breath sounds.  Abdominal:     General: Bowel sounds are normal. There is no distension.     Palpations: Abdomen is soft.     Tenderness: There is no abdominal tenderness.  Musculoskeletal:        General: Tenderness present.     Cervical back: Normal range of motion and neck supple.     Comments: TTP over groin, lateral aspect, greater trochanter.  Mild IT band tenderness.  No significant swelling.  No overlying lesions of area of chief complaint.  Decreased strength and ROM due to elicited pain.  Dorsiflexion and plantarflexion intact.  BLE appear grossly  neurovascularly intact.  Gait mildly antalgic with use of cane.   Lymphadenopathy:     Cervical: No cervical adenopathy.  Skin:    General: Skin is warm and dry.     Capillary Refill: Capillary refill takes less than 2 seconds.     Findings: No erythema or rash.  Neurological:     General: No focal deficit present.     Mental Status: He is alert and oriented to person, place, and time.  Psychiatric:        Mood and Affect: Mood normal.        Behavior: Behavior normal.     Vital signs in last 24 hours: @VSRANGES @  Labs:   Estimated body mass index is 29.97 kg/m as calculated from the following:   Height as of 10/29/23: 6' (1.829 m).   Weight as of 10/29/23: 100.2 kg.   Imaging Review Plain radiographs demonstrate severe degenerative joint disease of the left hip(s). The bone quality appears to be fair for age and reported activity level.      Assessment/Plan:  End stage arthritis, left hip(s)  The patient history, physical examination, clinical judgement of the provider and imaging studies are consistent with end stage degenerative joint disease of  the left hip(s) and total hip arthroplasty is deemed medically necessary. The treatment options including medical management, injection therapy, arthroscopy and arthroplasty were discussed at length. The risks and benefits of total hip arthroplasty were presented and reviewed. The risks due to aseptic loosening, infection, stiffness, dislocation/subluxation,  thromboembolic complications and other imponderables were discussed.  The patient acknowledged the explanation, agreed to proceed with the plan and consent was signed. Patient is being admitted for inpatient treatment for surgery, pain control, PT, OT, prophylactic antibiotics, VTE prophylaxis, progressive ambulation and ADL's and discharge planning.The patient is planning to be discharged home with HHPT ([facility])   Anticipated LOS equal to or greater than 2 midnights due  to - Age 82 and older with one or more of the following:  - Obesity  - Expected need for hospital services (PT, OT, Nursing) required for safe  discharge  - Anticipated need for postoperative skilled nursing care or inpatient rehab  - Active co-morbidities: hx of prostate cancer 2020, pre-diabetes, HTN, HLD, GERD, Afib, HOH, CHF, OSA, RBBB, NICM, macular degeneration, Hx gastric ulcers, potential cardiac murmur, hx of DVT, hx of heart cath, rheumatoid arthritis, Watchman OR   - Unanticipated findings during/Post Surgery: None  - Patient is a high risk of re-admission due to: None

## 2023-12-24 NOTE — H&P (Signed)
 TOTAL HIP ADMISSION H&P  Patient is admitted for left total hip arthroplasty.  Subjective:  Chief Complaint: left hip pain  HPI: Connor [name], 89 y.o. male, has a history of pain and functional disability in the left hip(s) due to arthritis and patient has failed non-surgical conservative treatments for greater than 12 weeks to include NSAID's and/or analgesics, corticosteriod injections, use of assistive devices, and activity modification.  Onset of symptoms was gradual starting less than 1 years ago with steadily worsening course since that time.The patient noted no past surgery on the left hip(s).  Patient currently rates pain in the left hip at 10 out of 10 with activity. Patient has night pain, worsening of pain with activity and weight bearing, pain that interfers with activities of daily living, and pain with passive range of motion. Patient has evidence of periarticular osteophytes and joint space narrowing by imaging studies. This condition presents safety issues increasing the risk of falls.  There is no current active infection.  Patient Active Problem List   Diagnosis Date Noted   Pain due to onychomycosis of toenails of both feet 07/03/2023   Atrial fibrillation (HCC) 05/30/2022   Presence of Watchman left atrial appendage closure device 05/30/2022   Hematuria 01/04/2022   Gross hematuria 01/04/2022   Secondary hypercoagulable state (HCC) 03/28/2020   Malignant neoplasm of prostate (HCC) 09/11/2018   Enlarged prostate with urinary obstruction 07/06/2018   Gastrocnemius strain, right, initial encounter 12/10/2017   Baker's cyst of knee, right 12/10/2017   Obesity 03/24/2017   Paroxysmal atrial fibrillation (HCC) 12/05/2016   Near syncope 04/20/2016   Leg hematoma, left, initial encounter 04/20/2016   Bleeding    Ecchymosis    Left hamstring muscle strain    Muscle tear    RBBB 08/30/2015   Chronic systolic dysfunction of left ventricle 07/11/2014   OSA (obstructive  sleep apnea) 04/07/2013   Multinodular goiter 01/20/2013   Cervical spondylosis without myelopathy 01/18/2013   Left ventricular dysfunction 12/17/2011   Low back pain 09/11/2011   Fatigue 05/22/2011   PVC (premature ventricular contraction) 01/18/2011   Persistent atrial fibrillation (HCC) 01/12/2011   Hyperlipidemia 08/02/2010   Hypertension 08/02/2010   Osteoarthritis 08/02/2010   BPH (benign prostatic hyperplasia) 08/02/2010   GERD (gastroesophageal reflux disease) 08/02/2010   h/o Atrial flutter (HCC) s/p ablation 2012    Past Medical History:  Diagnosis Date   Allergy 1985   Benign localized prostatic hyperplasia with lower urinary tract symptoms (LUTS)    urologist-- dr matilda   Cancer [facility]) 07/2018   prostate   Cataract 12/2012   CHF (congestive heart failure) (HCC) ?   Chronic anticoagulation    Xarelto  for afib   Chronic constipation    Chronic cystitis    Clotting disorder (HCC)    GERD (gastroesophageal reflux disease)    Gross hematuria    Heart murmur ?   History of DVT of lower extremity 06/08/2010   right femoral dvt dx post op total hip 05-08-2010   History of gastric ulcer    remote   History of kidney stones    one stone. remote hx   Hypercholesteremia    Hyperlipidemia ?   Hypertension    Dx by Dr. Salena Skill around age  66    Macular degeneration    dry   NICM (nonischemic cardiomyopathy) (HCC)    per echo 06-11-2017,  ef 45-50% with diffuse hypokinesis,  G1DD   OSA on CPAP 2017   compliant with  CPAP.  managed by Dr Jude.   Osteoarthritis    Other urethral stricture, male, meatal    chronic;  pt self dilates   Paroxysmal atrial fibrillation [facility]) cardiologist-- dr kelsie   first dx 2009/   a. documented by Dr Comer on office EKG 9/12. b. maintained on Tikosyn  and Xarelto . c. s/p DCCV 's.  d.  x3  EP w/ ablation atrial fib last one 12-05-2016   Prediabetes    Premature ventricular contraction    Presence of Watchman left atrial appendage  closure device 05/30/2022   Watchman FLX 24mm by Dr. Cindie   PVC's (premature ventricular contractions)    RA (rheumatoid arthritis) [facility])    rheumatologist--- Dr LORELEI Jacob,  treated with Humira   RBBB (right bundle branch block)    Hx of a fib, ablation Aug 2018   Rheumatoid arthritis (HCC)    Sleep apnea    Ulcer    Vitamin D  deficiency    Wears hearing aid in both ears     Past Surgical History:  Procedure Laterality Date   ABLATION OF DYSRHYTHMIC FOCUS  08/29/2015   ATRIAL FIBRILLATION ABLATION  12/05/2016   ATRIAL FIBRILLATION ABLATION N/A 12/05/2016   Procedure: Atrial Fibrillation Ablation;  Surgeon: kelsie Agent, MD;  Location: [facility] INVASIVE CV LAB;  Service: Cardiovascular;  Laterality: N/A;   ATRIAL FIBRILLATION ABLATION N/A 02/29/2020   Procedure: ATRIAL FIBRILLATION ABLATION;  Surgeon: kelsie Agent, MD;  Location: [facility] INVASIVE CV LAB;  Service: Cardiovascular;  Laterality: N/A;   ATRIAL FLUTTER ABLATION  09/2010   by MILUS EDWARDS Right 1990s   CARDIOVERSION N/A 10/28/2012   Procedure: CARDIOVERSION;  Surgeon: Lonni JONETTA Cash, MD;  Location: [facility] OR;  Service: Cardiovascular;  Laterality: N/A;   CATARACT EXTRACTION W/ INTRAOCULAR LENS  IMPLANT, BILATERAL  2019   CYSTOSCOPY WITH BIOPSY N/A 09/03/2018   Procedure: CYSTOSCOPY WITH FULGURATION CELESTA;  Surgeon: Matilda Senior, MD;  Location: [facility] ORS;  Service: Urology;  Laterality: N/A;  30 MINS   CYSTOSCOPY WITH FULGERATION N/A 01/23/2022   Procedure: CYSTOSCOPY WITH FULGERATION OF PROSTATIC URETHRA;  Surgeon: Matilda Senior, MD;  Location: [facility] ORS;  Service: Urology;  Laterality: N/A;  30 MINS   DENTAL SURGERY     ELECTROPHYSIOLOGIC STUDY N/A 08/29/2015   Procedure: Atrial Fibrillation Ablation;  Surgeon: Agent kelsie, MD;  Location: [facility] INVASIVE CV LAB;  Service: Cardiovascular;  Laterality: N/A;   EYE SURGERY     Cataracts   JOINT REPLACEMENT  1995, 1997   KNEE SURGERY Bilateral x3   1960s & 1970s    open   LEFT ATRIAL APPENDAGE OCCLUSION N/A 05/30/2022   Procedure: LEFT ATRIAL APPENDAGE OCCLUSION;  Surgeon: Cindie Ole DASEN, MD;  Location: [facility] INVASIVE CV LAB;  Service: Cardiovascular;  Laterality: N/A;   LEFT HEART CATHETERIZATION WITH CORONARY ANGIOGRAM N/A 06/07/2011   Procedure: LEFT HEART CATHETERIZATION WITH CORONARY ANGIOGRAM;  Surgeon: Peter M Swaziland, MD;  Location: [facility] CATH LAB;  Service: Cardiovascular;  Laterality: N/A;    Normal coronary arteries,  low normal LVF   TEE WITHOUT CARDIOVERSION N/A 05/30/2022   Procedure: TRANSESOPHAGEAL ECHOCARDIOGRAM (TEE);  Surgeon: Cindie Ole DASEN, MD;  Location: [facility] INVASIVE CV LAB;  Service: Cardiovascular;  Laterality: N/A;   TONSILLECTOMY AND ADENOIDECTOMY  age 13   TOTAL HIP ARTHROPLASTY Right 06/04/10    dr jane  @[facility]    TOTAL KNEE ARTHROPLASTY Bilateral right 1995;  left 1997   TRANSURETHRAL RESECTION OF PROSTATE  06-29-2002   dr humphries  @  [facility]   TRANSURETHRAL RESECTION OF PROSTATE N/A 07/06/2018   Procedure: TRANSURETHRAL RESECTION OF THE PROSTATE (TURP);  Surgeon: Matilda Senior, MD;  Location: [facility];  Service: Urology;  Laterality: N/A;  45 MINS    Current Outpatient Medications  Medication Sig Dispense Refill Last Dose/Taking   acetaminophen  (TYLENOL ) 500 MG tablet Take 1,000 mg by mouth 2 (two) times daily as needed for moderate pain.      aspirin  EC 81 MG tablet Take 1 tablet (81 mg total) by mouth daily. Swallow whole.      atorvastatin  (LIPITOR) 10 MG tablet Take 1 tablet (10 mg total) by mouth daily. 90 tablet 1    Carboxymethylcellulose Sodium (THERATEARS OP) Apply 1 drop to eye daily as needed (dry eyes).      cephALEXin  (KEFLEX ) 500 MG capsule Take 2,000 mg by mouth as directed. Dental appointments      Cholecalciferol  (VITAMIN D3) 5000 UNITS TABS Take 5,000 Units by mouth every morning.      clindamycin (CLEOCIN T) 1 % external solution Apply 1 Application topically daily.      Cranberry 1000  MG CAPS Take 4,000 mg by mouth 2 (two) times daily.      FLUZONE HIGH-DOSE 0.5 ML injection Inject 0.5 mLs into the muscle once.      HUMIRA PEN 40 MG/0.4ML PNKT Inject 40 mg as directed every 7 (seven) days. Every Tuesday  6    lisinopril  (ZESTRIL ) 40 MG tablet TAKE 1 TABLET BY MOUTH EVERY DAY 90 tablet 3    Melatonin 10 MG CHEW Chew 3 Pieces of gum by mouth at bedtime. gummy      metoprolol  succinate (TOPROL -XL) 50 MG 24 hr tablet Take 1 tablet (50 mg total) by mouth 2 (two) times daily. Take with or immediately following a meal. 180 tablet 3    Multiple Vitamins-Minerals (PRESERVISION AREDS 2) CAPS Take 1 capsule by mouth 2 (two) times a day.      pantoprazole  (PROTONIX ) 40 MG tablet TAKE 1 TABLET BY MOUTH EVERY DAY 90 tablet 3    senna (SENOKOT) 8.6 MG TABS tablet Take 2 tablets by mouth at bedtime. Per patient taking 2 tablets daily      solifenacin (VESICARE) 10 MG tablet Take 10 mg by mouth at bedtime.       SPIKEVAX syringe Inject 0.5 mLs into the muscle once.      tamsulosin  (FLOMAX ) 0.4 MG CAPS capsule Take 1 capsule (0.4 mg total) by mouth daily after supper. 30 capsule 5    Testosterone  20.25 MG/ACT (1.62%) GEL Apply 1 Pump topically at bedtime. Apply to shoulders      vitamin B-12 (CYANOCOBALAMIN ) 500 MCG tablet Take 500 mcg by mouth every evening.       No current facility-administered medications for this visit.   Allergies  Allergen Reactions   Penicillins Itching, Swelling and Other (See Comments)    Did it involve swelling of the face/tongue/throat, SOB, or low BP? No Did it involve sudden or severe rash/hives, skin peeling, or any reaction on the inside of your mouth or nose? No Did you need to seek medical attention at a hospital or doctor's office? No When did it last happen?      30 + years If all above answers are "NO", may proceed with cephalosporin use.      Social History   Tobacco Use   Smoking status: Former    Current packs/day: 0.00    Average packs/day:  1 pack/day  for 30.0 years (30.0 ttl pk-yrs)    Types: Cigarettes    Start date: 05/06/1948    Quit date: 05/06/1978    Years since quitting: 45.6   Smokeless tobacco: Never   Tobacco comments:    Former smoker 03/14/2021  Substance Use Topics   Alcohol  use: Yes    Alcohol /week: 1.0 standard drink of alcohol     Types: 1 Glasses of wine per week    Comment: occasionally wine and beer    Family History  Problem Relation Age of Onset   Heart attack Father    Heart disease Father    Obesity Father    Lung disease Mother    Arthritis Mother    Arrhythmia Sister    Atrial fibrillation Brother    Diabetes Neg Hx      Review of Systems  Musculoskeletal:  Positive for arthralgias.  All other systems reviewed and are negative.   Objective:  Physical Exam Constitutional:      General: He is not in acute distress.    Appearance: Normal appearance. He is normal weight.  HENT:     Head: Normocephalic and atraumatic.  Eyes:     Extraocular Movements: Extraocular movements intact.     Conjunctiva/sclera: Conjunctivae normal.     Pupils: Pupils are equal, round, and reactive to light.  Cardiovascular:     Rate and Rhythm: Normal rate and regular rhythm.     Pulses: Normal pulses.     Heart sounds: Normal heart sounds.  Pulmonary:     Effort: Pulmonary effort is normal. No respiratory distress.     Breath sounds: Normal breath sounds.  Abdominal:     General: Bowel sounds are normal. There is no distension.     Palpations: Abdomen is soft.     Tenderness: There is no abdominal tenderness.  Musculoskeletal:        General: Tenderness present.     Cervical back: Normal range of motion and neck supple.     Comments: TTP over groin, lateral aspect, greater trochanter.  Mild IT band tenderness.  No significant swelling.  No overlying lesions of area of chief complaint.  Decreased strength and ROM due to elicited pain.  Dorsiflexion and plantarflexion intact.  BLE appear grossly  neurovascularly intact.  Gait mildly antalgic with use of cane.   Lymphadenopathy:     Cervical: No cervical adenopathy.  Skin:    General: Skin is warm and dry.     Capillary Refill: Capillary refill takes less than 2 seconds.     Findings: No erythema or rash.  Neurological:     General: No focal deficit present.     Mental Status: He is alert and oriented to person, place, and time.  Psychiatric:        Mood and Affect: Mood normal.        Behavior: Behavior normal.     Vital signs in last 24 hours: @VSRANGES @  Labs:   Estimated body mass index is 29.97 kg/m as calculated from the following:   Height as of 10/29/23: 6' (1.829 m).   Weight as of 10/29/23: 100.2 kg.   Imaging Review Plain radiographs demonstrate severe degenerative joint disease of the left hip(s). The bone quality appears to be fair for age and reported activity level.      Assessment/Plan:  End stage arthritis, left hip(s)  The patient history, physical examination, clinical judgement of the provider and imaging studies are consistent with end stage degenerative joint disease of  the left hip(s) and total hip arthroplasty is deemed medically necessary. The treatment options including medical management, injection therapy, arthroscopy and arthroplasty were discussed at length. The risks and benefits of total hip arthroplasty were presented and reviewed. The risks due to aseptic loosening, infection, stiffness, dislocation/subluxation,  thromboembolic complications and other imponderables were discussed.  The patient acknowledged the explanation, agreed to proceed with the plan and consent was signed. Patient is being admitted for inpatient treatment for surgery, pain control, PT, OT, prophylactic antibiotics, VTE prophylaxis, progressive ambulation and ADL's and discharge planning.The patient is planning to be discharged home with HHPT ([facility])   Anticipated LOS equal to or greater than 2 midnights due  to - Age 82 and older with one or more of the following:  - Obesity  - Expected need for hospital services (PT, OT, Nursing) required for safe  discharge  - Anticipated need for postoperative skilled nursing care or inpatient rehab  - Active co-morbidities: hx of prostate cancer 2020, pre-diabetes, HTN, HLD, GERD, Afib, HOH, CHF, OSA, RBBB, NICM, macular degeneration, Hx gastric ulcers, potential cardiac murmur, hx of DVT, hx of heart cath, rheumatoid arthritis, Watchman OR   - Unanticipated findings during/Post Surgery: None  - Patient is a high risk of re-admission due to: None

## 2023-12-25 NOTE — Progress Notes (Signed)
 Sent message, via epic in basket, requesting orders in epic from [facility].

## 2023-12-29 ENCOUNTER — Other Ambulatory Visit (HOSPITAL_COMMUNITY)

## 2023-12-30 NOTE — Progress Notes (Signed)
 Second request for pre op orders, left voicemail for [employer].

## 2023-12-30 NOTE — Care Plan (Signed)
 Ortho Bundle Case Management Note  Patient Details  Name: Connor [name] MRN: [id number] Date of Birth: 1933/12/06    met with patient in the office for H&P. will discharge to home with family to assist. has RW at home. HHPT referral to [facility]. OPPT set up with [facility]- [employer]. discharge instructions discussed and questions answered. wife has memory issues. patient states that children will help him.  Patient and MD in agreement with plan. Choice offered.                DME Arranged:    DME Agency:     HH Arranged:  PT HH Agency:  [facility] ([facility])  Additional Comments: Please contact me with any questions of if this plan should need to change.  Charlies Pitch,  RN,BSN,MHA,CCM  [facility]opaedic Specialist  [phone] 12/30/2023, 4:51 PM

## 2023-12-31 NOTE — Patient Instructions (Signed)
 SURGICAL WAITING ROOM VISITATION Patients having surgery or a procedure may have no more than 2 support people in the waiting area - these visitors may rotate in the visitor waiting room.   If the patient needs to stay at the hospital during part of their recovery, the visitor guidelines for inpatient rooms apply.  PRE-OP  VISITATION  Pre-op  nurse will coordinate an appropriate time for 1 support person to accompany the patient in pre-op .  This support person may not rotate.  This visitor will be contacted when the time is appropriate for the visitor to come back in the pre-op  area.  Please refer to the [facility] website for the visitor guidelines for Inpatients (after your surgery is over and you are in a regular room).  You are not required to quarantine at this time prior to your surgery. However, you must do this: Hand Hygiene often Do NOT share personal items Notify your provider if you are in close contact with someone who has COVID or you develop fever 100.4 or greater, new onset of sneezing, cough, sore throat, shortness of breath or body aches.  If you test positive for Covid or have been in contact with anyone that has tested positive in the last 10 days please notify you surgeon.    Your procedure is scheduled on: Wednesday January 07, 2024   Report to [facility] Main Entrance: [address] entrance where the [employer] is available.   Report to admitting at: 06:00  AM  Call this number if you have any questions or problems the morning of surgery [phone]  Do not eat food after Midnight the night prior to your surgery/procedure.  After Midnight you may have the following liquids until  05:30???    AM  DAY OF SURGERY  Clear Liquid Diet Water  Black Coffee (sugar ok, NO MILK/CREAM OR CREAMERS)  Tea (sugar ok, NO MILK/CREAM OR CREAMERS) regular and decaf                             Plain Jell-O  with no fruit (NO RED)                                            Fruit ices (not with fruit pulp, NO RED)                                     Popsicles (NO RED)                                                                  Juice: NO CITRUS JUICES: only apple, WHITE grape, WHITE cranberry Sports drinks like Gatorade or Powerade (NO RED)                FOLLOW ANY ADDITIONAL PRE OP INSTRUCTIONS YOU RECEIVED FROM YOUR SURGEON'S OFFICE!!!   Oral Hygiene is also important to reduce your risk of infection.        Remember - BRUSH YOUR TEETH THE MORNING OF SURGERY WITH YOUR REGULAR TOOTHPASTE  Do  NOT smoke after Midnight the night before surgery.  STOP TAKING all Vitamins, Herbs and supplements 1 week before your surgery.   ASPIRIN - Stop taking 7 days before your surgery. Last dose was taken on 12-31-23  ???  Take ONLY these medicines the morning of surgery with A SIP OF WATER : Pantoprazole , Metoprolol , and Tylenol  if needed for pain. You may use your Eye drops if needed.   DO NOT TAKE LISINOPRIL  the morning of your surgery.   If You have been diagnosed with Sleep Apnea - Bring CPAP mask and tubing day of surgery. We will provide you with a CPAP machine on the day of your surgery.                   You may not have any metal on your body including jewelry, and body piercing  Do not wear lotions, powders, cologne, or deodorant  Men may shave face and neck.  Contacts, Hearing Aids, dentures or bridgework may not be worn into surgery. DENTURES WILL BE REMOVED PRIOR TO SURGERY PLEASE DO NOT APPLY Poly grip OR ADHESIVES!!!  You may bring a small overnight bag with you on the day of surgery, only pack items that are not valuable. [facility] IS NOT RESPONSIBLE   FOR VALUABLES THAT ARE LOST OR STOLEN.   Do not bring your home medications to the hospital. The Pharmacy will dispense medications listed on your medication list to you during your admission in the Hospital.  Special Instructions: Bring a copy of your healthcare power of attorney and living  will documents the day of surgery, if you wish to have them scanned into your [facility]- EPIC  Please read over the following fact sheets you were given: IF YOU HAVE QUESTIONS ABOUT YOUR PRE-OP  INSTRUCTIONS, PLEASE CALL [phone].     Pre-operative 5 CHG Bath Instructions   You can play a key role in reducing the risk of infection after surgery. Your skin needs to be as free of germs as possible. You can reduce the number of germs on your skin by washing with CHG (chlorhexidine  gluconate) soap before surgery. CHG is an antiseptic soap that kills germs and continues to kill germs even after washing.   DO NOT use if you have an allergy to chlorhexidine /CHG or antibacterial soaps. If your skin becomes reddened or irritated, stop using the CHG and notify one of our RNs at [phone]  Please shower with the CHG soap starting 4 days before surgery using the following schedule: START SHOWERS ON  SATURDAY  January 03, 2024  Please keep in mind the following:  DO NOT shave, including legs and underarms, starting the day of your first shower.   You may shave your face at any point before/day of surgery.   Place clean sheets on your bed the day you start using CHG soap. Use a clean washcloth (not used since being washed) for each shower. DO NOT sleep with pets once you start using the CHG.   CHG Shower Instructions:  If you choose to wash your hair and private area, wash first with your normal shampoo/soap.  After you use shampoo/soap, rinse your hair and body thoroughly to remove shampoo/soap residue.  Turn the water  OFF and apply about 3 tablespoons (45 ml) of CHG soap to a CLEAN washcloth.  Apply CHG soap ONLY FROM YOUR NECK DOWN TO YOUR TOES (washing for 3-5 minutes)  DO NOT use CHG soap on face, private  areas, open wounds, or sores.  Pay special attention to the area where your surgery is being performed.  If you are having back surgery, having someone wash your back for you may be helpful.  Wait 2 minutes after CHG soap is applied, then you may rinse off the CHG soap.  Pat dry with a clean towel  Put on clean clothes/pajamas   If you choose to wear lotion, please use ONLY the CHG-compatible lotions on the back of this paper.     Additional instructions for the day of surgery: DO NOT APPLY any lotions, deodorants, cologne, or perfumes.   Put on clean/comfortable clothes.  Brush your teeth.  Ask your nurse before applying any prescription medications to the skin.      CHG Compatible Lotions   Aveeno Moisturizing lotion  Cetaphil Moisturizing Cream  Cetaphil Moisturizing Lotion  Clairol Herbal Essence Moisturizing Lotion, Dry Skin  Clairol Herbal Essence Moisturizing Lotion, Extra Dry Skin  Clairol Herbal Essence Moisturizing Lotion, Normal Skin  Curel Age Defying Therapeutic Moisturizing Lotion with Alpha Hydroxy  Curel Extreme Care Body Lotion  Curel Soothing Hands Moisturizing Hand Lotion  Curel Therapeutic Moisturizing Cream, Fragrance-Free  Curel Therapeutic Moisturizing Lotion, Fragrance-Free  Curel Therapeutic Moisturizing Lotion, Original Formula  Eucerin Daily Replenishing Lotion  Eucerin Dry Skin Therapy Plus Alpha Hydroxy Crme  Eucerin Dry Skin Therapy Plus Alpha Hydroxy Lotion  Eucerin Original Crme  Eucerin Original Lotion  Eucerin Plus Crme Eucerin Plus Lotion  Eucerin TriLipid Replenishing Lotion  Keri Anti-Bacterial Hand Lotion  Keri Deep Conditioning Original Lotion Dry Skin Formula Softly Scented  Keri Deep Conditioning Original Lotion, Fragrance Free Sensitive Skin Formula  Keri Lotion Fast Absorbing Fragrance Free Sensitive Skin Formula  Keri Lotion Fast Absorbing Softly Scented Dry Skin Formula  Keri Original Lotion  Keri Skin Renewal Lotion Keri  Silky Smooth Lotion  Keri Silky Smooth Sensitive Skin Lotion  Nivea Body Creamy Conditioning Oil  Nivea Body Extra Enriched Lotion  Nivea Body Original Lotion  Nivea Body Sheer Moisturizing Lotion Nivea Crme  Nivea Skin Firming Lotion  NutraDerm 30 Skin Lotion  NutraDerm Skin Lotion  NutraDerm Therapeutic Skin Cream  NutraDerm Therapeutic Skin Lotion  ProShield Protective Hand Cream  Provon moisturizing lotion   FAILURE TO FOLLOW THESE INSTRUCTIONS MAY RESULT IN THE CANCELLATION OF YOUR SURGERY  PATIENT SIGNATURE_________________________________  NURSE SIGNATURE__________________________________  ________________________________________________________________________       Connor [name]    An incentive spirometer is a tool that can help keep your lungs clear and active. This tool measures how well you are filling your lungs with each breath. Taking  long deep breaths may help reverse or decrease the chance of developing breathing (pulmonary) problems (especially infection) following: A long period of time when you are unable to move or be active. BEFORE THE PROCEDURE  If the spirometer includes an indicator to show your best effort, your nurse or respiratory therapist will set it to a desired goal. If possible, sit up straight or lean slightly forward. Try not to slouch. Hold the incentive spirometer in an upright position. INSTRUCTIONS FOR USE  Sit on the edge of your bed if possible, or sit up as far as you can in bed or on a chair. Hold the incentive spirometer in an upright position. Breathe out normally. Place the mouthpiece in your mouth and seal your lips tightly around it. Breathe in slowly and as deeply as possible, raising the piston or the ball toward the top of the column. Hold your breath for 3-5 seconds or for as long as possible. Allow the piston or ball to fall to the bottom of the column. Remove the mouthpiece from your mouth and breathe out  normally. Rest for a few seconds and repeat Steps 1 through 7 at least 10 times every 1-2 hours when you are awake. Take your time and take a few normal breaths between deep breaths. The spirometer may include an indicator to show your best effort. Use the indicator as a goal to work toward during each repetition. After each set of 10 deep breaths, practice coughing to be sure your lungs are clear. If you have an incision (the cut made at the time of surgery), support your incision when coughing by placing a pillow or rolled up towels firmly against it. Once you are able to get out of bed, walk around indoors and cough well. You may stop using the incentive spirometer when instructed by your caregiver.  RISKS AND COMPLICATIONS Take your time so you do not get dizzy or light-headed. If you are in pain, you may need to take or ask for pain medication before doing incentive spirometry. It is harder to take a deep breath if you are having pain. AFTER USE Rest and breathe slowly and easily. It can be helpful to keep track of a log of your progress. Your caregiver can provide you with a simple table to help with this. If you are using the spirometer at home, follow these instructions: SEEK MEDICAL CARE IF:  You are having difficultly using the spirometer. You have trouble using the spirometer as often as instructed. Your pain medication is not giving enough relief while using the spirometer. You develop fever of 100.5 F (38.1 C) or higher.                                                                                                    SEEK IMMEDIATE MEDICAL CARE IF:  You cough up bloody sputum that had not been present before. You develop fever of 102 F (38.9 C) or greater. You develop worsening pain at or near the incision site. MAKE SURE YOU:  Understand these instructions. Will watch your condition. Will  get help right away if you are not doing well or get worse. Document Released:  09/02/2006 Document Revised: 07/15/2011 Document Reviewed: 11/03/2006 [facility] Patient Information 2014 [place], [place].        WHAT IS A BLOOD TRANSFUSION? Blood Transfusion Information  A transfusion is the replacement of blood or some of its parts. Blood is made up of multiple cells which provide different functions. Red blood cells carry oxygen and are used for blood loss replacement. White blood cells fight against infection. Platelets control bleeding. Plasma helps clot blood. Other blood products are available for specialized needs, such as hemophilia or other clotting disorders. BEFORE THE TRANSFUSION  Who gives blood for transfusions?  Healthy volunteers who are fully evaluated to make sure their blood is safe. This is blood bank blood. Transfusion therapy is the safest it has ever been in the practice of medicine. Before blood is taken from a donor, a complete history is taken to make sure that person has no history of diseases nor engages in risky social behavior (examples are intravenous drug use or sexual activity with multiple partners). The donor's travel history is screened to minimize risk of transmitting infections, such as malaria. The donated blood is tested for signs of infectious diseases, such as HIV and hepatitis. The blood is then tested to be sure it is compatible with you in order to minimize the chance of a transfusion reaction. If you or a relative donates blood, this is often done in anticipation of surgery and is not appropriate for emergency situations. It takes many days to process the donated blood. RISKS AND COMPLICATIONS Although transfusion therapy is very safe and saves many lives, the main dangers of transfusion include:  Getting an infectious disease. Developing a transfusion reaction. This is an allergic reaction to something in the blood you were given. Every precaution is taken to prevent this. The decision to have a blood transfusion has been  considered carefully by your caregiver before blood is given. Blood is not given unless the benefits outweigh the risks. AFTER THE TRANSFUSION Right after receiving a blood transfusion, you will usually feel much better and more energetic. This is especially true if your red blood cells have gotten low (anemic). The transfusion raises the level of the red blood cells which carry oxygen, and this usually causes an energy increase. The nurse administering the transfusion will monitor you carefully for complications. HOME CARE INSTRUCTIONS  No special instructions are needed after a transfusion. You may find your energy is better. Speak with your caregiver about any limitations on activity for underlying diseases you may have. SEEK MEDICAL CARE IF:  Your condition is not improving after your transfusion. You develop redness or irritation at the intravenous (IV) site. SEEK IMMEDIATE MEDICAL CARE IF:  Any of the following symptoms occur over the next 12 hours: Shaking chills. You have a temperature by mouth above 102 F (38.9 C), not controlled by medicine. Chest, back, or muscle pain. People around you feel you are not acting correctly or are confused. Shortness of breath or difficulty breathing. Dizziness and fainting. You get a rash or develop hives. You have a decrease in urine output. Your urine turns a dark color or changes to pink, red, or brown. Any of the following symptoms occur over the next 10 days: You have a temperature by mouth above 102 F (38.9 C), not controlled by medicine. Shortness of breath. Weakness after normal activity. The white part of the eye turns yellow (jaundice). You have  a decrease in the amount of urine or are urinating less often. Your urine turns a dark color or changes to pink, red, or brown. Document Released: 04/19/2000 Document Revised: 07/15/2011 Document Reviewed: 12/07/2007 [facility] Patient Information 2014 [place], [place].      If you would  like to see a video about joint replacement:   [email or web address]               _______________________________________________________________________

## 2023-12-31 NOTE — Progress Notes (Signed)
 COVID Vaccine received:  []  No [x]  Yes Date of any COVID positive Test in last 90 days:  PCP - Harlene Schroeder, MD clearance scanned in Media 11-28-2023  Cardiologist - Redell Shallow, MD Lamarr Satterfield, NP  cardiac clearance in 10-31-23 Telephone note EP- Ole Holts, MD Rheumatology- Jon Jacob, MD  Chest x-ray - 05-30-2022  2v Epic EKG - 10-29-2023  Epic  Stress Test -  ECHO - 05-30-2022 TEE Cardiac Cath - 06-07-2011  LHC  CT Coronary Calcium  score:   Pacemaker / ICD device [x]  No []  Yes   Spinal Cord Stimulator:[x]  No []  Yes       History of Sleep Apnea? []  No [x]  Yes   CPAP used?- []  No [x]  Yes    Patient has: []  NO Hx DM   [x]  Pre-DM   []  DM1  []   DM2 Does the patient monitor blood sugar?   []  N/A   []  No []  Yes  Last A1c was: 6.5  on 09-15-23      Blood Thinner / Instructions:  none Aspirin  Instructions:  ASA 81 mg   hold x 7 days  last dose: 12-11-23  ERAS Protocol Ordered: []  No  []  Yes PRE-SURGERY []  ENSURE  []  G2   []  No Drink Ordered  Patient is to be NPO after: NO ORDERS  Dental hx: []  Dentures:  []  N/A      []  Bridge or Partial:                   []  Loose or Damaged teeth:   Comments: Patient was given the 5 CHG shower / bath instructions for THA surgery along with 2 bottles of the CHG soap. Patient will start this on:  Saturday 01-03-24            Activity level: Able to walk up 2 flights of stairs without becoming significantly short of breath or having chest pain?  []  No   []    Yes  Patient can perform ADLs without assistance. []  No   []   Yes  Anesthesia review: s/p Watchman 05-30-2022, A.Fib last ablation 02-29-20, HTN, NICM, RA, GERD, Pre-DM, HOH- HAs, macular degeneration,   Patient denies any S&S of respiratory illness or Covid - no shortness of breath, fever, cough or chest pain at PAT appointment.  Patient verbalized understanding and agreement to the Pre-Surgical Instructions that were given to them at this PAT appointment. Patient was also educated  of the need to review these PAT instructions again prior to his surgery.I reviewed the appropriate phone numbers to call if they have any and questions or concerns.

## 2024-01-01 ENCOUNTER — Other Ambulatory Visit: Payer: Self-pay

## 2024-01-01 ENCOUNTER — Encounter (HOSPITAL_COMMUNITY): Payer: Self-pay

## 2024-01-01 ENCOUNTER — Encounter (HOSPITAL_COMMUNITY)
Admission: RE | Admit: 2024-01-01 | Discharge: 2024-01-01 | Disposition: A | Discharge status: Home / Self Care | Source: Ambulatory Visit | Attending: Orthopedic Surgery | Admitting: Orthopedic Surgery

## 2024-01-01 VITALS — BP 124/70 | HR 62 | Temp 97.7°F | Resp 16 | Ht 72.0 in | Wt 214.0 lb

## 2024-01-01 DIAGNOSIS — Z87891 Personal history of nicotine dependence: Secondary | ICD-10-CM | POA: Diagnosis not present

## 2024-01-01 DIAGNOSIS — M1612 Unilateral primary osteoarthritis, left hip: Secondary | ICD-10-CM | POA: Diagnosis not present

## 2024-01-01 DIAGNOSIS — M069 Rheumatoid arthritis, unspecified: Secondary | ICD-10-CM | POA: Insufficient documentation

## 2024-01-01 DIAGNOSIS — I129 Hypertensive chronic kidney disease with stage 1 through stage 4 chronic kidney disease, or unspecified chronic kidney disease: Secondary | ICD-10-CM | POA: Insufficient documentation

## 2024-01-01 DIAGNOSIS — G4733 Obstructive sleep apnea (adult) (pediatric): Secondary | ICD-10-CM | POA: Diagnosis not present

## 2024-01-01 DIAGNOSIS — N189 Chronic kidney disease, unspecified: Secondary | ICD-10-CM | POA: Diagnosis not present

## 2024-01-01 DIAGNOSIS — Z79899 Other long term (current) drug therapy: Secondary | ICD-10-CM | POA: Diagnosis not present

## 2024-01-01 DIAGNOSIS — I2510 Atherosclerotic heart disease of native coronary artery without angina pectoris: Secondary | ICD-10-CM | POA: Insufficient documentation

## 2024-01-01 DIAGNOSIS — Z01818 Encounter for other preprocedural examination: Secondary | ICD-10-CM

## 2024-01-01 DIAGNOSIS — I480 Paroxysmal atrial fibrillation: Secondary | ICD-10-CM | POA: Insufficient documentation

## 2024-01-01 DIAGNOSIS — Z01812 Encounter for preprocedural laboratory examination: Secondary | ICD-10-CM | POA: Diagnosis not present

## 2024-01-01 DIAGNOSIS — I428 Other cardiomyopathies: Secondary | ICD-10-CM | POA: Diagnosis not present

## 2024-01-01 DIAGNOSIS — I4891 Unspecified atrial fibrillation: Secondary | ICD-10-CM

## 2024-01-01 HISTORY — DX: Chronic kidney disease, unspecified: N18.9

## 2024-01-01 HISTORY — DX: Prediabetes: R73.03

## 2024-01-01 LAB — SURGICAL PCR SCREEN
MRSA, PCR: NEGATIVE
Staphylococcus aureus: NEGATIVE

## 2024-01-01 LAB — COMPREHENSIVE METABOLIC PANEL WITH GFR
ALT: 12 U/L (ref 0–44)
AST: 16 U/L (ref 15–41)
Albumin: 3.8 g/dL (ref 3.5–5.0)
Alkaline Phosphatase: 67 U/L (ref 38–126)
Anion gap: 10 (ref 5–15)
BUN: 13 mg/dL (ref 8–23)
CO2: 26 mmol/L (ref 22–32)
Calcium: 8.8 mg/dL — ABNORMAL LOW (ref 8.9–10.3)
Chloride: 106 mmol/L (ref 98–111)
Creatinine, Ser: 0.88 mg/dL (ref 0.61–1.24)
GFR, Estimated: >60 mL/min (ref >60)
Glucose, Bld: 115 mg/dL — ABNORMAL HIGH (ref 70–99)
Potassium: 4.2 mmol/L (ref 3.5–5.1)
Sodium: 141 mmol/L (ref 135–145)
Total Bilirubin: 1.1 mg/dL (ref 0.0–1.2)
Total Protein: 6.3 g/dL — ABNORMAL LOW (ref 6.5–8.1)

## 2024-01-01 LAB — CBC
HCT: 39.7 % (ref 39.0–52.0)
Hemoglobin: 12.7 g/dL — ABNORMAL LOW (ref 13.0–17.0)
MCH: 34.6 pg — ABNORMAL HIGH (ref 26.0–34.0)
MCHC: 32 g/dL (ref 30.0–36.0)
MCV: 108.2 fL — ABNORMAL HIGH (ref 80.0–100.0)
Platelets: 134 K/uL — ABNORMAL LOW (ref 150–400)
RBC: 3.67 MIL/uL — ABNORMAL LOW (ref 4.22–5.81)
RDW: 13.4 % (ref 11.5–15.5)
WBC: 6.6 K/uL (ref 4.0–10.5)
nRBC: 0 % (ref 0.0–0.2)

## 2024-01-02 NOTE — Progress Notes (Signed)
 Anesthesia Chart Review   Case: [id number] Date/Time: 01/07/24 0815   Procedure: ARTHROPLASTY, HIP, TOTAL,POSTERIOR APPROACH (Left: Hip)   Anesthesia type: Spinal   Pre-op  diagnosis: OA LEFT HIP   Location: [facility] ROOM 08 / [facility]   Surgeons: Edna Toribio LABOR, MD       DISCUSSION:89 y.o. former smoker with h/o HTN, OSA uses CPAP, RA,  PAF with watchman device in place, NICM, CKD, prostate cancer, left hip OA scheduled for above procedure 01/07/24 with Dr. Toribio Edna.   Per cardiology preoperative evaluation 10/31/23, Chart reviewed as part of pre-operative protocol coverage. Patient was contacted 10/31/2023 in reference to pre-operative risk assessment for pending surgery as outlined below.  Connor [name] was last seen on 10/29/2023 by Dr. Pietro.  Since that day, Connor [name] has done well without new cardiac issues.   Therefore, based on ACC/AHA guidelines, the patient would be at acceptable risk for the planned procedure without further cardiovascular testing. He is advised to hold ASA for 7 days prior to the procedure.  He verbalized understanding.   Clearance received from PCP which state pt is moderate risk for planned procedure.   VS: BP 124/70 Comment: right arm sitting  Pulse 62   Temp 36.5 C (Oral)   Resp 16   Ht 6' (1.829 m)   Wt 97.1 kg   SpO2 95%   BMI 29.02 kg/m   PROVIDERS: Copland, Harlene BROCKS, MD is PCP   Cardiologist - Redell Pietro, MD  LABS: Labs reviewed: Acceptable for surgery. (all labs ordered are listed, but only abnormal results are displayed)  Labs Reviewed  COMPREHENSIVE METABOLIC PANEL WITH GFR - Abnormal; Notable for the following components:      Result Value   Glucose, Bld 115 (*)    Calcium  8.8 (*)    Total Protein 6.3 (*)    All other components within normal limits  CBC - Abnormal; Notable for the following components:   RBC 3.67 (*)    Hemoglobin 12.7 (*)    MCV 108.2 (*)    MCH 34.6 (*)    Platelets 134 (*)    All  other components within normal limits  SURGICAL PCR SCREEN  TYPE AND SCREEN     IMAGES:   EKG:   CV: Echo 05/30/2022  1. Left ventricular ejection fraction, by estimation, is 50 to 55%. The  left ventricle has low normal function.   2. Right ventricular systolic function is normal. The right ventricular  size is moderately enlarged.   3. Left atrial size was moderately dilated. No left atrial/left atrial  appendage thrombus was detected.   4. Right atrial size was mild to moderately dilated.   5. A small pericardial effusion is present. The pericardial effusion is  localized near the right atrium and anterior to the right ventricle.   6. The mitral valve is myxomatous. Mild mitral valve regurgitation. No  evidence of mitral stenosis. There is mild holosystolic prolapse of both  leaflets of the mitral valve.   7. The tricuspid valve is myxomatous.   8. The aortic valve is tricuspid. Aortic valve regurgitation is trivial.  No aortic stenosis is present.   9. Evidence of atrial level shunting detected by color flow Doppler.  There is a small patent foramen ovale with predominantly left to right  shunting across the atrial septum.  Past Medical History:  Diagnosis Date   Allergy 1985   Anemia    Benign localized prostatic hyperplasia with lower  urinary tract symptoms (LUTS)    urologist-- dr dahlstedt   Cancer [facility]) 07/2018   prostate   Cataract 12/2012   CHF (congestive heart failure) (HCC) ?   Chronic anticoagulation    Xarelto  for afib   Chronic constipation    Chronic cystitis    Chronic kidney disease    Clotting disorder (HCC)    GERD (gastroesophageal reflux disease)    Gross hematuria    Heart murmur ?   History of DVT of lower extremity 06/08/2010   right femoral dvt dx post op total hip 05-08-2010   History of gastric ulcer    remote   History of kidney stones    one stone. remote hx   Hypercholesteremia    Hyperlipidemia ?   Hypertension    Dx by Dr.  Salena Skill around age  75    Macular degeneration    dry in both eyes,  no injections  sees Dr. Alvia   NICM (nonischemic cardiomyopathy) [facility])    per echo 06-11-2017,  ef 45-50% with diffuse hypokinesis,  G1DD   OSA on CPAP 2017   compliant with CPAP.  managed by Dr Jude.   Osteoarthritis    Other urethral stricture, male, meatal    chronic;  pt self dilates   Paroxysmal atrial fibrillation [facility]) cardiologist-- dr kelsie   first dx 2009/   a. documented by Dr Comer on office EKG 9/12. b. maintained on Tikosyn  and Xarelto . c. s/p DCCV 's.  d.  x3  EP w/ ablation atrial fib last one 12-05-2016   Pre-diabetes    Premature ventricular contraction    Presence of Watchman left atrial appendage closure device 05/30/2022   Watchman FLX 24mm by Dr. Cindie   PVC's (premature ventricular contractions)    RA (rheumatoid arthritis) [facility])    rheumatologist--- Dr LORELEI Jacob,  treated with Humira   RBBB (right bundle branch block)    Hx of a fib, ablation Aug 2018   PVCs,   Pt had Watchman placed 05-30-2022   Rheumatoid arthritis (HCC)    Sleep apnea    Ulcer    Vitamin D  deficiency    Wears hearing aid in both ears     Past Surgical History:  Procedure Laterality Date   ABLATION OF DYSRHYTHMIC FOCUS  08/29/2015   ATRIAL FIBRILLATION ABLATION  12/05/2016   ATRIAL FIBRILLATION ABLATION N/A 12/05/2016   Procedure: Atrial Fibrillation Ablation;  Surgeon: kelsie Agent, MD;  [facility]: [facility]VAS[facility] CV LAB;  Service: Cardiovascular;  Laterality: N/A;   ATRIAL FIBRILLATION ABLATION N/A 02/29/2020   Procedure: ATRIAL FIBRILLATION ABLATION;  Surgeon: kelsie Agent, MD;  Location: [facility] INVASIVE CV LAB;  Service: Cardiovascular;  Laterality: N/A;   ATRIAL FLUTTER ABLATION  09/2010   by MILUS EDWARDS Right 1990s   CARDIOVERSION N/A 10/28/2012   Procedure: CARDIOVERSION;  Surgeon: Lonni JONETTA Cash, MD;  Location: [facility] OR;  Service: Cardiovascular;  Laterality: N/A;   CATARACT EXTRACTION W/  INTRAOCULAR LENS  IMPLANT, BILATERAL  2019   CYSTOSCOPY WITH BIOPSY N/A 09/03/2018   Procedure: CYSTOSCOPY WITH FULGURATION CELESTA;  Surgeon: Matilda Senior, MD;  Location: [facility];  Service: Urology;  Laterality: N/A;  30 MINS   CYSTOSCOPY WITH FULGERATION N/A 01/23/2022   Procedure: CYSTOSCOPY WITH FULGERATION OF PROSTATIC URETHRA;  Surgeon: Matilda Senior, MD;  Location: [facility];  Service: Urology;  Laterality: N/A;  30 MINS   DENTAL SURGERY     ELECTROPHYSIOLOGIC STUDY N/A 08/29/2015   Procedure: Atrial Fibrillation  Ablation;  Surgeon: Lynwood Rakers, MD;  Location: [facility] INVASIVE CV LAB;  Service: Cardiovascular;  Laterality: N/A;   EYE SURGERY     Cataracts   JOINT REPLACEMENT  1995, 1997   KNEE SURGERY Bilateral x3   1960s & 1970s   open   LEFT ATRIAL APPENDAGE OCCLUSION N/A 05/30/2022   Procedure: LEFT ATRIAL APPENDAGE OCCLUSION;  Surgeon: Cindie Ole DASEN, MD;  [facility];  Service: Cardiovascular;  Laterality: N/A;   LEFT HEART CATHETERIZATION WITH CORONARY ANGIOGRAM N/A 06/07/2011   Procedure: LEFT HEART CATHETERIZATION WITH CORONARY ANGIOGRAM;  Surgeon: Peter M Swaziland, MD;  Location: [facility] CATH LAB;  Service: Cardiovascular;  Laterality: N/A;    Normal coronary arteries,  low normal LVF   TEE WITHOUT CARDIOVERSION N/A 05/30/2022   Procedure: TRANSESOPHAGEAL ECHOCARDIOGRAM (TEE);  Surgeon: Cindie Ole DASEN, MD;  Location: [facility] INVASIVE CV LAB;  Service: Cardiovascular;  Laterality: N/A;   TONSILLECTOMY AND ADENOIDECTOMY  age 11   TOTAL HIP ARTHROPLASTY Right 06/04/10    dr jane  @[facility]    TOTAL KNEE ARTHROPLASTY Bilateral right 1995;  left 1997   TRANSURETHRAL RESECTION OF PROSTATE  06-29-2002   dr humphries  @[facility]    TRANSURETHRAL RESECTION OF PROSTATE N/A 07/06/2018   Procedure: TRANSURETHRAL RESECTION OF THE PROSTATE (TURP);  Surgeon: Matilda Senior, MD;  Location: [facility];  Service: Urology;  Laterality: N/A;  45 MINS    MEDICATIONS:   acetaminophen  (TYLENOL ) 500 MG tablet   aspirin  EC 81 MG tablet   atorvastatin  (LIPITOR) 10 MG tablet   Carboxymethylcellulose Sodium (THERATEARS OP)   cephALEXin  (KEFLEX ) 500 MG capsule   Cholecalciferol  (VITAMIN D3) 5000 UNITS TABS   CRANBERRY PO   HUMIRA PEN 40 MG/0.4ML PNKT   lisinopril  (ZESTRIL ) 40 MG tablet   Melatonin 10 MG CHEW   metoprolol  succinate (TOPROL -XL) 50 MG 24 hr tablet   Multiple Vitamins-Minerals (PRESERVISION AREDS 2) CAPS   pantoprazole  (PROTONIX ) 40 MG tablet   senna (SENOKOT) 8.6 MG TABS tablet   solifenacin (VESICARE) 10 MG tablet   tamsulosin  (FLOMAX ) 0.4 MG CAPS capsule   Testosterone  20.25 MG/ACT (1.62%) GEL   vitamin B-12 (CYANOCOBALAMIN ) 500 MCG tablet   No current facility-administered medications for this encounter.    Harlene Hoots Ward, PA-C [facility] Pre-Surgical Testing [phone]

## 2024-01-02 NOTE — Anesthesia Preprocedure Evaluation (Addendum)
 Anesthesia Evaluation  Patient identified by MRN, date of birth, ID band Patient awake    Reviewed: Allergy & Precautions, NPO status , Patient's Chart, lab work & pertinent test results  Airway Mallampati: III  TM Distance: >3 FB Neck ROM: Full    Dental  (+) Missing   Pulmonary sleep apnea , former smoker   Pulmonary exam normal        Cardiovascular hypertension, Pt. on home beta blockers and Pt. on medications +CHF  Normal cardiovascular exam+ dysrhythmias Atrial Fibrillation   ECHO: 1. Left ventricular ejection fraction, by estimation, is 50 to 55% . The left ventricle has low normal function. 2. Right ventricular systolic function is normal. The right ventricular size is moderately enlarged. 3. Left atrial size was moderately dilated. No left atrial/ left atrial appendage thrombus was detected. 4. Right atrial size was mild to moderately dilated. 5. A small pericardial effusion is present. The pericardial effusion is localized near the right atrium and anterior to the right ventricle. 6. The mitral valve is myxomatous. Mild mitral valve regurgitation. No evidence of mitral stenosis. There is mild holosystolic prolapse of both leaflets of the mitral valve. 7. The tricuspid valve is myxomatous. 8. The aortic valve is tricuspid. Aortic valve regurgitation is trivial. No aortic stenosis is present. 9. Evidence of atrial level shunting detected by color flow Doppler. There is a small patent foramen ovale with predominantly left to right shunting across the atrial septum.   Neuro/Psych  Neuromuscular disease    GI/Hepatic Neg liver ROS,GERD  Medicated and Controlled,,  Endo/Other  negative endocrine ROS    Renal/GU negative Renal ROS     Musculoskeletal  (+) Arthritis ,    Abdominal   Peds  Hematology negative hematology ROS (+) PLT: 134   Anesthesia Other Findings OA LEFT HIP  Reproductive/Obstetrics                               Anesthesia Physical Anesthesia Plan  ASA: 3  Anesthesia Plan: Spinal   Post-op Pain Management:    Induction:   PONV Risk Score and Plan: 1 and Propofol  infusion, Ondansetron , Dexamethasone  and Treatment may vary due to age or medical condition  Airway Management Planned: Nasal Cannula  Additional Equipment:   Intra-op Plan:   Post-operative Plan:   Informed Consent: I have reviewed the patients History and Physical, chart, labs and discussed the procedure including the risks, benefits and alternatives for the proposed anesthesia with the patient or authorized representative who has indicated his/her understanding and acceptance.     Dental advisory given  Plan Discussed with: CRNA  Anesthesia Plan Comments: (See PAT note 01/01/2024)         Anesthesia Quick Evaluation

## 2024-01-03 ENCOUNTER — Other Ambulatory Visit: Payer: Self-pay | Admitting: Family Medicine

## 2024-01-03 ENCOUNTER — Other Ambulatory Visit (HOSPITAL_COMMUNITY): Payer: Self-pay | Admitting: Cardiology

## 2024-01-03 DIAGNOSIS — E785 Hyperlipidemia, unspecified: Secondary | ICD-10-CM

## 2024-01-07 ENCOUNTER — Encounter (HOSPITAL_COMMUNITY)
Admission: RE | Disposition: A | Discharge status: Home / Self Care | Payer: Self-pay | Source: Home / Self Care | Attending: Orthopedic Surgery

## 2024-01-07 ENCOUNTER — Observation Stay (HOSPITAL_COMMUNITY)
Admission: RE | Admit: 2024-01-07 | Discharge: 2024-01-08 | Disposition: A | Discharge status: Home / Self Care | Attending: Orthopedic Surgery | Admitting: Orthopedic Surgery

## 2024-01-07 ENCOUNTER — Other Ambulatory Visit: Payer: Self-pay

## 2024-01-07 ENCOUNTER — Ambulatory Visit (HOSPITAL_COMMUNITY): Payer: Self-pay | Admitting: Physician Assistant

## 2024-01-07 ENCOUNTER — Ambulatory Visit (HOSPITAL_COMMUNITY): Admitting: Anesthesiology

## 2024-01-07 ENCOUNTER — Observation Stay (HOSPITAL_COMMUNITY)

## 2024-01-07 ENCOUNTER — Encounter (HOSPITAL_COMMUNITY): Payer: Self-pay | Admitting: Orthopedic Surgery

## 2024-01-07 DIAGNOSIS — Z79899 Other long term (current) drug therapy: Secondary | ICD-10-CM | POA: Insufficient documentation

## 2024-01-07 DIAGNOSIS — I509 Heart failure, unspecified: Secondary | ICD-10-CM | POA: Diagnosis not present

## 2024-01-07 DIAGNOSIS — M1612 Unilateral primary osteoarthritis, left hip: Principal | ICD-10-CM | POA: Insufficient documentation

## 2024-01-07 DIAGNOSIS — Z87891 Personal history of nicotine dependence: Secondary | ICD-10-CM | POA: Diagnosis not present

## 2024-01-07 DIAGNOSIS — M1712 Unilateral primary osteoarthritis, left knee: Secondary | ICD-10-CM | POA: Diagnosis not present

## 2024-01-07 DIAGNOSIS — R600 Localized edema: Secondary | ICD-10-CM | POA: Diagnosis not present

## 2024-01-07 DIAGNOSIS — I5040 Unspecified combined systolic (congestive) and diastolic (congestive) heart failure: Secondary | ICD-10-CM | POA: Insufficient documentation

## 2024-01-07 DIAGNOSIS — E785 Hyperlipidemia, unspecified: Secondary | ICD-10-CM | POA: Insufficient documentation

## 2024-01-07 DIAGNOSIS — I480 Paroxysmal atrial fibrillation: Secondary | ICD-10-CM

## 2024-01-07 DIAGNOSIS — I110 Hypertensive heart disease with heart failure: Secondary | ICD-10-CM | POA: Insufficient documentation

## 2024-01-07 DIAGNOSIS — Z7982 Long term (current) use of aspirin: Secondary | ICD-10-CM | POA: Insufficient documentation

## 2024-01-07 DIAGNOSIS — K219 Gastro-esophageal reflux disease without esophagitis: Secondary | ICD-10-CM | POA: Diagnosis not present

## 2024-01-07 DIAGNOSIS — I4891 Unspecified atrial fibrillation: Secondary | ICD-10-CM | POA: Diagnosis not present

## 2024-01-07 DIAGNOSIS — F1090 Alcohol use, unspecified, uncomplicated: Secondary | ICD-10-CM | POA: Insufficient documentation

## 2024-01-07 DIAGNOSIS — M25552 Pain in left hip: Secondary | ICD-10-CM | POA: Diagnosis present

## 2024-01-07 HISTORY — PX: TOTAL HIP ARTHROPLASTY: SHX124

## 2024-01-07 LAB — TYPE AND SCREEN
ABO/RH(D): O POS
Antibody Screen: NEGATIVE

## 2024-01-07 SURGERY — ARTHROPLASTY, HIP, TOTAL,POSTERIOR APPROACH
Anesthesia: Spinal | Site: Hip | Laterality: Left

## 2024-01-07 MED ORDER — SODIUM CHLORIDE (PF) 0.9 % IJ SOLN
INTRAMUSCULAR | Status: DC | PRN
  Administered 2024-01-07: 90 mL via SURGICAL_CAVITY

## 2024-01-07 MED ORDER — OXYCODONE HCL 5 MG PO TABS
5.0000 mg | ORAL_TABLET | ORAL | Status: DC | PRN
  Administered 2024-01-07 – 2024-01-08 (×2): 5 mg via ORAL
  Administered 2024-01-08: 10 mg via ORAL
  Administered 2024-01-08: 5 mg via ORAL
  Filled 2024-01-07 (×2): qty 1
  Filled 2024-01-07 (×2): qty 2
  Filled 2024-01-07: qty 1

## 2024-01-07 MED ORDER — CHLORHEXIDINE GLUCONATE 0.12 % MT SOLN
15.0000 mL | Freq: Once | OROMUCOSAL | Status: AC
  Administered 2024-01-07: 15 mL via OROMUCOSAL

## 2024-01-07 MED ORDER — SUGAMMADEX SODIUM 200 MG/2ML IV SOLN
INTRAVENOUS | Status: AC
  Filled 2024-01-07: qty 2

## 2024-01-07 MED ORDER — CELECOXIB 100 MG PO CAPS: 100.0000 mg | ORAL_CAPSULE | Freq: Two times a day (BID) | ORAL | 0 refills | Status: AC

## 2024-01-07 MED ORDER — SENNA 8.6 MG PO TABS: 2.0000 | ORAL_TABLET | Freq: Every day | ORAL | Status: DC

## 2024-01-07 MED ORDER — LISINOPRIL 20 MG PO TABS
40.0000 mg | ORAL_TABLET | Freq: Every day | ORAL | Status: DC
  Administered 2024-01-08: 40 mg via ORAL
  Filled 2024-01-07: qty 2

## 2024-01-07 MED ORDER — METHOCARBAMOL 1000 MG/10ML IJ SOLN
INTRAMUSCULAR | Status: AC
  Filled 2024-01-07: qty 10

## 2024-01-07 MED ORDER — METHOCARBAMOL 500 MG PO TABS
500.0000 mg | ORAL_TABLET | Freq: Four times a day (QID) | ORAL | Status: DC | PRN
  Administered 2024-01-08: 500 mg via ORAL
  Filled 2024-01-07: qty 1

## 2024-01-07 MED ORDER — PHENYLEPHRINE 80 MCG/ML (10ML) SYRINGE FOR IV PUSH (FOR BLOOD PRESSURE SUPPORT)
PREFILLED_SYRINGE | INTRAVENOUS | Status: AC
  Filled 2024-01-07: qty 20

## 2024-01-07 MED ORDER — PHENOL 1.4 % MT LIQD
1.0000 | OROMUCOSAL | Status: DC | PRN
Start: 2024-01-07 — End: 2024-01-08

## 2024-01-07 MED ORDER — METHOCARBAMOL 1000 MG/10ML IJ SOLN
500.0000 mg | Freq: Four times a day (QID) | INTRAMUSCULAR | Status: DC | PRN
  Administered 2024-01-07: 500 mg via INTRAVENOUS

## 2024-01-07 MED ORDER — HYDROMORPHONE HCL 2 MG/ML IJ SOLN
INTRAMUSCULAR | Status: AC
  Filled 2024-01-07: qty 1

## 2024-01-07 MED ORDER — ACETAMINOPHEN 325 MG PO TABS: 325.0000 mg | ORAL_TABLET | Freq: Four times a day (QID) | ORAL | Status: DC | PRN

## 2024-01-07 MED ORDER — AMISULPRIDE (ANTIEMETIC) 5 MG/2ML IV SOLN: 10.0000 mg | Freq: Once | INTRAVENOUS | Status: DC | PRN

## 2024-01-07 MED ORDER — ROCURONIUM BROMIDE 100 MG/10ML IV SOLN
INTRAVENOUS | Status: DC | PRN
  Administered 2024-01-07: 40 mg via INTRAVENOUS

## 2024-01-07 MED ORDER — FESOTERODINE FUMARATE ER 4 MG PO TB24
4.0000 mg | ORAL_TABLET | Freq: Every day | ORAL | Status: DC
  Administered 2024-01-08: 4 mg via ORAL
  Filled 2024-01-07: qty 1

## 2024-01-07 MED ORDER — ATORVASTATIN CALCIUM 10 MG PO TABS
10.0000 mg | ORAL_TABLET | Freq: Every day | ORAL | Status: DC
  Administered 2024-01-08: 10 mg via ORAL
  Filled 2024-01-07: qty 1

## 2024-01-07 MED ORDER — WATER FOR IRRIGATION, STERILE IR SOLN
Status: DC | PRN
  Administered 2024-01-07: 2000 mL

## 2024-01-07 MED ORDER — SODIUM CHLORIDE 0.9 % IR SOLN
Status: DC | PRN
Start: 2024-01-07 — End: 2024-01-07
  Administered 2024-01-07: 4000 mL

## 2024-01-07 MED ORDER — LACTATED RINGERS IV SOLN: INTRAVENOUS | Status: DC

## 2024-01-07 MED ORDER — FENTANYL CITRATE (PF) 100 MCG/2ML IJ SOLN
INTRAMUSCULAR | Status: DC | PRN
  Administered 2024-01-07: 100 ug via INTRAVENOUS

## 2024-01-07 MED ORDER — 0.9 % SODIUM CHLORIDE (POUR BTL) OPTIME
TOPICAL | Status: DC | PRN
  Administered 2024-01-07: 1000 mL

## 2024-01-07 MED ORDER — HYDROMORPHONE HCL 1 MG/ML IJ SOLN: 0.5000 mg | INTRAMUSCULAR | Status: DC | PRN

## 2024-01-07 MED ORDER — APIXABAN 2.5 MG PO TABS: 2.5000 mg | ORAL_TABLET | Freq: Two times a day (BID) | ORAL | 0 refills | Status: DC

## 2024-01-07 MED ORDER — SODIUM CHLORIDE (PF) 0.9 % IJ SOLN
INTRAMUSCULAR | Status: AC
  Filled 2024-01-07: qty 30

## 2024-01-07 MED ORDER — FENTANYL CITRATE PF 50 MCG/ML IJ SOSY
25.0000 ug | PREFILLED_SYRINGE | INTRAMUSCULAR | Status: DC | PRN
  Administered 2024-01-07 (×3): 50 ug via INTRAVENOUS

## 2024-01-07 MED ORDER — DOCUSATE SODIUM 100 MG PO CAPS
100.0000 mg | ORAL_CAPSULE | Freq: Two times a day (BID) | ORAL | Status: DC
  Administered 2024-01-07 – 2024-01-08 (×2): 100 mg via ORAL
  Filled 2024-01-07 (×2): qty 1

## 2024-01-07 MED ORDER — ACETAMINOPHEN 10 MG/ML IV SOLN
1000.0000 mg | Freq: Once | INTRAVENOUS | Status: AC
  Administered 2024-01-07: 1000 mg via INTRAVENOUS
  Filled 2024-01-07: qty 100

## 2024-01-07 MED ORDER — DEXAMETHASONE SODIUM PHOSPHATE 10 MG/ML IJ SOLN
INTRAMUSCULAR | Status: AC
  Filled 2024-01-07: qty 1

## 2024-01-07 MED ORDER — OXYCODONE HCL 5 MG PO TABS
ORAL_TABLET | ORAL | Status: AC
  Filled 2024-01-07: qty 1

## 2024-01-07 MED ORDER — MELATONIN 5 MG PO TABS
10.0000 mg | ORAL_TABLET | Freq: Every day | ORAL | Status: DC
  Administered 2024-01-07: 10 mg via ORAL
  Filled 2024-01-07: qty 2

## 2024-01-07 MED ORDER — ONDANSETRON HCL 4 MG PO TABS: 4.0000 mg | ORAL_TABLET | Freq: Three times a day (TID) | ORAL | 0 refills | Status: AC | PRN

## 2024-01-07 MED ORDER — ONDANSETRON HCL 4 MG/2ML IJ SOLN
INTRAMUSCULAR | Status: DC | PRN
  Administered 2024-01-07: 4 mg via INTRAVENOUS

## 2024-01-07 MED ORDER — ONDANSETRON HCL 4 MG/2ML IJ SOLN
INTRAMUSCULAR | Status: AC
  Filled 2024-01-07: qty 2

## 2024-01-07 MED ORDER — TAMSULOSIN HCL 0.4 MG PO CAPS
0.4000 mg | ORAL_CAPSULE | Freq: Every day | ORAL | Status: DC
  Administered 2024-01-07: 0.4 mg via ORAL
  Filled 2024-01-07: qty 1

## 2024-01-07 MED ORDER — KETOROLAC TROMETHAMINE 15 MG/ML IJ SOLN
7.5000 mg | Freq: Four times a day (QID) | INTRAMUSCULAR | Status: AC
  Administered 2024-01-07 – 2024-01-08 (×3): 7.5 mg via INTRAVENOUS
  Filled 2024-01-07 (×4): qty 1

## 2024-01-07 MED ORDER — POLYETHYLENE GLYCOL 3350 17 G PO PACK: 17.0000 g | PACK | Freq: Every day | ORAL | Status: DC | PRN

## 2024-01-07 MED ORDER — SENNA 8.6 MG PO TABS
1.0000 | ORAL_TABLET | Freq: Two times a day (BID) | ORAL | Status: DC
  Administered 2024-01-07 – 2024-01-08 (×2): 8.6 mg via ORAL
  Filled 2024-01-07 (×2): qty 1

## 2024-01-07 MED ORDER — FENTANYL CITRATE (PF) 100 MCG/2ML IJ SOLN
INTRAMUSCULAR | Status: AC
  Filled 2024-01-07: qty 2

## 2024-01-07 MED ORDER — ONDANSETRON HCL 4 MG/2ML IJ SOLN: 4.0000 mg | Freq: Four times a day (QID) | INTRAMUSCULAR | Status: DC | PRN

## 2024-01-07 MED ORDER — MENTHOL 3 MG MT LOZG: 1.0000 | LOZENGE | OROMUCOSAL | Status: DC | PRN

## 2024-01-07 MED ORDER — CEFAZOLIN SODIUM-DEXTROSE 2-4 GM/100ML-% IV SOLN
2.0000 g | Freq: Once | INTRAVENOUS | Status: AC
  Administered 2024-01-07: 2 g via INTRAVENOUS

## 2024-01-07 MED ORDER — BUPIVACAINE LIPOSOME 1.3 % IJ SUSP
INTRAMUSCULAR | Status: AC
  Filled 2024-01-07: qty 20

## 2024-01-07 MED ORDER — ACETAMINOPHEN 500 MG PO TABS
1000.0000 mg | ORAL_TABLET | Freq: Four times a day (QID) | ORAL | Status: AC
  Administered 2024-01-07 – 2024-01-08 (×4): 1000 mg via ORAL
  Filled 2024-01-07 (×4): qty 2

## 2024-01-07 MED ORDER — PHENYLEPHRINE 80 MCG/ML (10ML) SYRINGE FOR IV PUSH (FOR BLOOD PRESSURE SUPPORT)
PREFILLED_SYRINGE | INTRAVENOUS | Status: DC | PRN
  Administered 2024-01-07 (×4): 160 ug via INTRAVENOUS

## 2024-01-07 MED ORDER — DIPHENHYDRAMINE HCL 12.5 MG/5ML PO ELIX: 12.5000 mg | ORAL_SOLUTION | ORAL | Status: DC | PRN

## 2024-01-07 MED ORDER — SUGAMMADEX SODIUM 200 MG/2ML IV SOLN
INTRAVENOUS | Status: DC | PRN
  Administered 2024-01-07: 200 mg via INTRAVENOUS

## 2024-01-07 MED ORDER — HYDROMORPHONE HCL 1 MG/ML IJ SOLN
INTRAMUSCULAR | Status: DC | PRN
  Administered 2024-01-07 (×3): .2 mg via INTRAVENOUS

## 2024-01-07 MED ORDER — LIDOCAINE HCL (PF) 2 % IJ SOLN
INTRAMUSCULAR | Status: AC
  Filled 2024-01-07: qty 5

## 2024-01-07 MED ORDER — METOPROLOL SUCCINATE ER 50 MG PO TB24
50.0000 mg | ORAL_TABLET | Freq: Two times a day (BID) | ORAL | Status: DC
  Administered 2024-01-07 – 2024-01-08 (×2): 50 mg via ORAL
  Filled 2024-01-07 (×2): qty 1

## 2024-01-07 MED ORDER — ORAL CARE MOUTH RINSE: 15.0000 mL | Freq: Once | OROMUCOSAL | Status: AC

## 2024-01-07 MED ORDER — METHOCARBAMOL 500 MG PO TABS: 500.0000 mg | ORAL_TABLET | Freq: Three times a day (TID) | ORAL | 0 refills | Status: AC | PRN

## 2024-01-07 MED ORDER — ACETAMINOPHEN 500 MG PO TABS: 1000.0000 mg | ORAL_TABLET | Freq: Three times a day (TID) | ORAL | Status: AC | PRN

## 2024-01-07 MED ORDER — FENTANYL CITRATE PF 50 MCG/ML IJ SOSY
PREFILLED_SYRINGE | INTRAMUSCULAR | Status: AC
  Filled 2024-01-07: qty 3

## 2024-01-07 MED ORDER — PROPOFOL 500 MG/50ML IV EMUL
INTRAVENOUS | Status: AC
  Filled 2024-01-07: qty 50

## 2024-01-07 MED ORDER — TRANEXAMIC ACID-NACL 1000-0.7 MG/100ML-% IV SOLN
1000.0000 mg | INTRAVENOUS | Status: AC
  Administered 2024-01-07: 1000 mg via INTRAVENOUS

## 2024-01-07 MED ORDER — PANTOPRAZOLE SODIUM 40 MG PO TBEC
40.0000 mg | DELAYED_RELEASE_TABLET | Freq: Every day | ORAL | Status: DC
  Administered 2024-01-08: 40 mg via ORAL
  Filled 2024-01-07: qty 1

## 2024-01-07 MED ORDER — CEFAZOLIN SODIUM-DEXTROSE 2-4 GM/100ML-% IV SOLN
2.0000 g | Freq: Four times a day (QID) | INTRAVENOUS | Status: AC
  Administered 2024-01-07 (×2): 2 g via INTRAVENOUS
  Filled 2024-01-07 (×2): qty 100

## 2024-01-07 MED ORDER — ONDANSETRON HCL 4 MG PO TABS: 4.0000 mg | ORAL_TABLET | Freq: Four times a day (QID) | ORAL | Status: DC | PRN

## 2024-01-07 MED ORDER — TRANEXAMIC ACID-NACL 1000-0.7 MG/100ML-% IV SOLN
INTRAVENOUS | Status: AC
  Filled 2024-01-07: qty 100

## 2024-01-07 MED ORDER — ACETAMINOPHEN 10 MG/ML IV SOLN: 1000.0000 mg | Freq: Once | INTRAVENOUS | Status: DC | PRN

## 2024-01-07 MED ORDER — ISOPROPYL ALCOHOL 70 % SOLN
Status: AC
  Filled 2024-01-07: qty 480

## 2024-01-07 MED ORDER — DEXAMETHASONE SODIUM PHOSPHATE 10 MG/ML IJ SOLN
INTRAMUSCULAR | Status: DC | PRN
  Administered 2024-01-07: 8 mg via INTRAVENOUS

## 2024-01-07 MED ORDER — OXYCODONE HCL 5 MG PO TABS
5.0000 mg | ORAL_TABLET | Freq: Once | ORAL | Status: AC
  Administered 2024-01-07: 5 mg via ORAL

## 2024-01-07 MED ORDER — ONDANSETRON HCL 4 MG/2ML IJ SOLN: 4.0000 mg | Freq: Once | INTRAMUSCULAR | Status: DC | PRN

## 2024-01-07 MED ORDER — CEFAZOLIN SODIUM-DEXTROSE 2-4 GM/100ML-% IV SOLN
INTRAVENOUS | Status: AC
  Filled 2024-01-07: qty 100

## 2024-01-07 MED ORDER — LIDOCAINE HCL (PF) 2 % IJ SOLN
INTRAMUSCULAR | Status: DC | PRN
  Administered 2024-01-07: 40 mg via INTRADERMAL

## 2024-01-07 MED ORDER — BUPIVACAINE-EPINEPHRINE (PF) 0.25% -1:200000 IJ SOLN
INTRAMUSCULAR | Status: AC
  Filled 2024-01-07: qty 30

## 2024-01-07 MED ORDER — ROCURONIUM BROMIDE 10 MG/ML (PF) SYRINGE
PREFILLED_SYRINGE | INTRAVENOUS | Status: AC
  Filled 2024-01-07: qty 10

## 2024-01-07 MED ORDER — APIXABAN 2.5 MG PO TABS
2.5000 mg | ORAL_TABLET | Freq: Two times a day (BID) | ORAL | Status: DC
  Administered 2024-01-08: 2.5 mg via ORAL
  Filled 2024-01-07: qty 1

## 2024-01-07 MED ORDER — PROPOFOL 10 MG/ML IV BOLUS
INTRAVENOUS | Status: DC | PRN
  Administered 2024-01-07: 200 mg via INTRAVENOUS

## 2024-01-07 MED ORDER — ISOPROPYL ALCOHOL 70 % SOLN
Status: DC | PRN
  Administered 2024-01-07: 1 via TOPICAL

## 2024-01-07 MED ORDER — OXYCODONE HCL 5 MG PO TABS: 5.0000 mg | ORAL_TABLET | ORAL | 0 refills | Status: AC | PRN

## 2024-01-07 MED ORDER — LACTATED RINGERS IV SOLN: INTRAVENOUS | Status: DC | PRN

## 2024-01-07 SURGICAL SUPPLY — 69 items
BAG COUNTER SPONGE SURGICOUNT (BAG) IMPLANT
BAG ZIPLOCK 12X15 (MISCELLANEOUS) ×1 IMPLANT
BIT DRILL TRIDENT 4X40 SU (BIT) IMPLANT
BLADE SAW SAG 25X90X1.19 (BLADE) ×1 IMPLANT
BRUSH FEMORAL CANAL (MISCELLANEOUS) IMPLANT
CATH FOLEY 2WAY SLVR 5CC 14FR (CATHETERS) IMPLANT
CEMENT BONE SIMPLEX SPEEDSET (Cement) IMPLANT
CEMENT RESTRICTOR BONE PREP ST (Cement) IMPLANT
CHLORAPREP W/TINT 26 (MISCELLANEOUS) ×2 IMPLANT
CNTNR URN SCR LID CUP LEK RST (MISCELLANEOUS) ×1 IMPLANT
COVER SURGICAL LIGHT HANDLE (MISCELLANEOUS) ×1 IMPLANT
DERMABOND ADVANCED .7 DNX12 (GAUZE/BANDAGES/DRESSINGS) ×2 IMPLANT
DRAPE HIP W/POCKET STRL (MISCELLANEOUS) ×1 IMPLANT
DRAPE INCISE IOBAN 85X60 (DRAPES) ×1 IMPLANT
DRAPE POUCH INSTRU U-SHP 10X18 (DRAPES) ×1 IMPLANT
DRAPE SHEET LG 3/4 BI-LAMINATE (DRAPES) ×3 IMPLANT
DRAPE U-SHAPE 47X51 STRL (DRAPES) ×2 IMPLANT
DRSG AQUACEL AG ADV 3.5X10 (GAUZE/BANDAGES/DRESSINGS) ×1 IMPLANT
ELECT BLADE TIP CTD 4 INCH (ELECTRODE) ×1 IMPLANT
ELECT REM PT RETURN 15FT ADLT (MISCELLANEOUS) ×1 IMPLANT
FACESHIELD WRAPAROUND OR TEAM (MASK) ×1 IMPLANT
GAUZE SPONGE 4X4 12PLY STRL (GAUZE/BANDAGES/DRESSINGS) ×1 IMPLANT
GLOVE BIO SURGEON STRL SZ 6.5 (GLOVE) ×2 IMPLANT
GLOVE BIOGEL PI IND STRL 6.5 (GLOVE) ×1 IMPLANT
GLOVE BIOGEL PI IND STRL 8 (GLOVE) ×1 IMPLANT
GLOVE SURG ORTHO 8.0 STRL STRW (GLOVE) ×2 IMPLANT
GOWN STRL REUS W/ TWL XL LVL3 (GOWN DISPOSABLE) ×2 IMPLANT
HEAD BIOLOX HIP 36/+2.5 (Joint) IMPLANT
HOLDER FOLEY CATH W/STRAP (MISCELLANEOUS) ×1 IMPLANT
HOOD PEEL AWAY T7 (MISCELLANEOUS) ×3 IMPLANT
INSERT 0 DEG POLY 36 F (Miscellaneous) IMPLANT
KIT BASIN OR (CUSTOM PROCEDURE TRAY) ×1 IMPLANT
KIT TURNOVER KIT A (KITS) ×1 IMPLANT
MANIFOLD NEPTUNE II (INSTRUMENTS) ×1 IMPLANT
MARKER SKIN DUAL TIP RULER LAB (MISCELLANEOUS) ×1 IMPLANT
NDL SAFETY ECLIPSE 18X1.5 (NEEDLE) ×2 IMPLANT
NS IRRIG 1000ML POUR BTL (IV SOLUTION) ×1 IMPLANT
PACK TOTAL JOINT (CUSTOM PROCEDURE TRAY) ×1 IMPLANT
PAD ARMBOARD POSITIONER FOAM (MISCELLANEOUS) ×1 IMPLANT
PENCIL SMOKE EVACUATOR (MISCELLANEOUS) ×1 IMPLANT
PRESSURIZER FEMORAL UNIV (MISCELLANEOUS) IMPLANT
PROTECTOR NERVE ULNAR (MISCELLANEOUS) IMPLANT
RETRIEVER SUT HEWSON (MISCELLANEOUS) ×1 IMPLANT
SCREW HEX LP 6.5X20 (Screw) IMPLANT
SCREW HEX LP 6.5X35 (Screw) IMPLANT
SEALER BIPOLAR AQUA 6.0 (INSTRUMENTS) IMPLANT
SET HNDPC FAN SPRY TIP SCT (DISPOSABLE) IMPLANT
SHELL TRIDENT II CLUST SZ 56MM (Shell) IMPLANT
SOLUTION IRRIG SURGIPHOR (IV SOLUTION) IMPLANT
SOLUTION PRONTOSAN WOUND 350ML (IRRIGATION / IRRIGATOR) IMPLANT
SPIKE FLUID TRANSFER (MISCELLANEOUS) ×1 IMPLANT
SPONGE T-LAP 18X18 ~~LOC~~+RFID (SPONGE) IMPLANT
STEM FEM CEMT 49X158 SZ6 127D (Stem) IMPLANT
SUCTION TUBE FRAZIER 12FR DISP (SUCTIONS) ×1 IMPLANT
SUT BONE WAX W31G (SUTURE) ×1 IMPLANT
SUT ETHIBOND #5 BRAIDED 30INL (SUTURE) ×1 IMPLANT
SUT MNCRL AB 3-0 PS2 18 (SUTURE) ×1 IMPLANT
SUT STRATAFIX 14 PDO 48 VLT (SUTURE) ×1 IMPLANT
SUT VIC AB 2-0 CT2 27 (SUTURE) ×2 IMPLANT
SUTURE STRATFX 0 PDS 27 VIOLET (SUTURE) ×1 IMPLANT
SYR 30ML LL (SYRINGE) ×1 IMPLANT
SYR 50ML LL SCALE MARK (SYRINGE) ×1 IMPLANT
TOWEL GREEN STERILE FF (TOWEL DISPOSABLE) ×1 IMPLANT
TOWEL OR 17X26 10 PK STRL BLUE (TOWEL DISPOSABLE) ×1 IMPLANT
TOWER CARTRIDGE SMART MIX (DISPOSABLE) IMPLANT
TRAY FOLEY MTR SLVR 16FR STAT (SET/KITS/TRAYS/PACK) IMPLANT
TUBE SUCTION HIGH CAP CLEAR NV (SUCTIONS) ×1 IMPLANT
UNDERPAD 30X36 HEAVY ABSORB (UNDERPADS AND DIAPERS) ×1 IMPLANT
WATER STERILE IRR 1000ML POUR (IV SOLUTION) ×2 IMPLANT

## 2024-01-07 NOTE — Plan of Care (Signed)
  Problem: Activity: Goal: Risk for activity intolerance will decrease Outcome: Progressing   Problem: Pain Managment: Goal: General experience of comfort will improve and/or be controlled Outcome: Progressing   Problem: Safety: Goal: Ability to remain free from injury will improve Outcome: Progressing   Problem: Nutrition: Goal: Adequate nutrition will be maintained Outcome: Progressing

## 2024-01-07 NOTE — Evaluation (Signed)
 Physical Therapy Evaluation Patient Details Name: Connor [name] MRN: [id number] DOB: 1934/03/17 Today's Date: 01/07/2024  History of Present Illness  Pt is 89 yo male s/p posterior L THA with no formal hip precautions on 01/07/24.  Pt with hx including but not limited to afib, prostate CA, OSA, cervical spondylosis, low back pain, HLD, HTN, OA, arthritis, RA, Bil TKA, R THA  Clinical Impression  Pt is s/p THA resulting in the deficits listed below (see PT Problem List). At baseline, pt independent and ambulatory with a cane.  He lives with his wife but his children will be coming in for a few weeks to assist if needed.  Pt only has 2 steps to negotiate with a grab bar.  Today, pt motivated to work with therapy.  Needed increase assist for transfer but once on his feet able to ambulate 25' with RW and min A for safety with turns.  Pt with good pain control.  Pt expected to progress well with therapy.  Pt will benefit from acute skilled PT to increase their independence and safety with mobility to facilitate discharge.          If plan is discharge home, recommend the following: A lot of help with walking and/or transfers;A lot of help with bathing/dressing/bathroom;Assistance with cooking/housework;Help with stairs or ramp for entrance   Can travel by private vehicle        Equipment Recommendations None recommended by PT  Recommendations for Other Services       Functional Status Assessment       Precautions / Restrictions Precautions Precautions: Fall Precaution/Restrictions Comments: No posterior hip precautions Restrictions Weight Bearing Restrictions Per Provider Order: Yes LLE Weight Bearing Per Provider Order: Weight bearing as tolerated      Mobility  Bed Mobility Overal bed mobility: Needs Assistance Bed Mobility: Supine to Sit     Supine to sit: Mod assist, HOB elevated, Used rails     General bed mobility comments: increased time, cues for hand placement;  assist for legs and scooting forward    Transfers Overall transfer level: Needs assistance Equipment used: Rolling walker (2 wheels) Transfers: Sit to/from Stand Sit to Stand: Min assist, From elevated surface           General transfer comment: cues for hand placement; needed bed elevated    Ambulation/Gait Ambulation/Gait assistance: Min assist, +2 safety/equipment Gait Distance (Feet): 25 Feet Assistive device: Rolling walker (2 wheels) Gait Pattern/deviations: Step-to pattern, Decreased stride length, Decreased weight shift to left Gait velocity: decreased     General Gait Details: fatigued easily, chair follow, min cues for RW proximity  Stairs            Wheelchair Mobility     Tilt Bed    Modified Rankin (Stroke Patients Only)       Balance Overall balance assessment: Needs assistance Sitting-balance support: No upper extremity supported Sitting balance-Leahy Scale: Good     Standing balance support: Bilateral upper extremity supported, Reliant on assistive device for balance Standing balance-Leahy Scale: Poor                               Pertinent Vitals/Pain Pain Assessment Pain Assessment: 0-10 Pain Score: 5  Pain Location: L hip wtih transfers Pain Descriptors / Indicators: Discomfort, Guarding Pain Intervention(s): Limited activity within patient's tolerance, Monitored during session, Premedicated before session, Repositioned, Ice applied    Home Living Family/patient expects to  be discharged to:: Private residence Living Arrangements: Spouse/significant other Available Help at Discharge: Available 24 hours/day (pt's children coming over several days to assist) Type of Home: House Home Access: Stairs to enter Entrance Stairs-Rails: Right Entrance Stairs-Number of Steps: 2   Home Layout: One level;Laundry or work area in [employer]: Grab bars - tub/shower;Shower seat;BSC/3in1;Rolling Environmental consultant (2 wheels);Cane -  single point;Lift chair      Prior Function Prior Level of Function : Independent/Modified Independent             Mobility Comments: Ambulated with cane prior to surgery; could ambulate in community but did not do frequently ADLs Comments: independent adls and shared iadls with wife     Extremity/Trunk Assessment   Upper Extremity Assessment Upper Extremity Assessment: Overall WFL for tasks assessed    Lower Extremity Assessment Lower Extremity Assessment: LLE deficits/detail;RLE deficits/detail RLE Deficits / Details: ROM WFL; MMT 5/5 LLE Deficits / Details: Expected post op changes; ROM WFL; MMT: ankle 5/5, knee 3/5, hip 1/5    Cervical / Trunk Assessment Cervical / Trunk Assessment: Normal  Communication   Communication Communication: Impaired Factors Affecting Communication: Hearing impaired    Cognition Arousal: Alert Behavior During Therapy: WFL for tasks assessed/performed   PT - Cognitive impairments: No apparent impairments                         Following commands: Intact       Cueing       General Comments General comments (skin integrity, edema, etc.): VSS    Exercises     Assessment/Plan    PT Assessment Patient needs continued PT services  PT Problem List Decreased strength;Decreased mobility;Decreased range of motion;Decreased activity tolerance;Decreased balance;Decreased knowledge of use of DME;Pain       PT Treatment Interventions Therapeutic exercise;DME instruction;Gait training;Stair training;Functional mobility training;Therapeutic activities;Patient/family education;Modalities;Balance training    PT Goals (Current goals can be found in the Care Plan section)  Acute Rehab PT Goals Patient Stated Goal: return home PT Goal Formulation: With patient/family Time For Goal Achievement: 01/21/24 Potential to Achieve Goals: Good    Frequency 7X/week     Co-evaluation               AM-PAC PT 6 Clicks Mobility   Outcome Measure Help needed turning from your back to your side while in a flat bed without using bedrails?: A Little Help needed moving from lying on your back to sitting on the side of a flat bed without using bedrails?: A Lot Help needed moving to and from a bed to a chair (including a wheelchair)?: A Little Help needed standing up from a chair using your arms (e.g., wheelchair or bedside chair)?: A Little Help needed to walk in hospital room?: A Little Help needed climbing 3-5 steps with a railing? : A Lot 6 Click Score: 16    End of Session Equipment Utilized During Treatment: Gait belt Activity Tolerance: Patient tolerated treatment well Patient left: with chair alarm set;in chair;with call bell/phone within reach Nurse Communication: Mobility status PT Visit Diagnosis: Other abnormalities of gait and mobility (R26.89);Muscle weakness (generalized) (M62.81)    Time: 8451-8378 PT Time Calculation (min) (ACUTE ONLY): 33 min   Charges:   PT Evaluation $PT Eval Low Complexity: 1 Low PT Treatments $Gait Training: 8-22 mins PT General Charges $$ ACUTE PT VISIT: 1 Visit         Benjiman, PT Acute Rehab Services [employer]  [phone]   Benjiman VEAR Mulberry 01/07/2024, 4:31 PM

## 2024-01-07 NOTE — Discharge Instructions (Addendum)
 INSTRUCTIONS AFTER JOINT REPLACEMENT   Remove items at home which could result in a fall. This includes throw rugs or furniture in walking pathways ICE to the affected joint every three hours while awake for 30 minutes at a time, for at least the first 3-5 days, and then as needed for pain and swelling.  Continue to use ice for pain and swelling. You may notice swelling that will progress down to the foot and ankle.  This is normal after surgery.  Elevate your leg when you are not up walking on it.   Continue to use the breathing machine you got in the hospital (incentive spirometer) which will help keep your temperature down.  It is common for your temperature to cycle up and down following surgery, especially at night when you are not up moving around and exerting yourself.  The breathing machine keeps your lungs expanded and your temperature down.  DIET:  As you were doing prior to hospitalization, we recommend a well-balanced diet.  DRESSING / WOUND CARE / SHOWERING:  Keep the surgical dressing until follow up.  The dressing is water  proof, so you can shower without any extra covering.  IF THE DRESSING FALLS OFF or the wound gets wet inside, change the dressing with sterile gauze.  Please use good hand washing techniques before changing the dressing.  Do not use any lotions or creams on the incision until instructed by your surgeon.    ACTIVITY  Increase activity slowly as tolerated, but follow the weight bearing instructions below.   No driving for 6 weeks or until further direction given by your physician.  You cannot drive while taking narcotics.  No lifting or carrying greater than 10 lbs. until further directed by your surgeon. Avoid periods of inactivity such as sitting longer than an hour when not asleep. This helps prevent blood clots.  You may return to work once you are authorized by your doctor.   WEIGHT BEARING: Weight bearing as tolerated with assist device (walker, cane, etc) as  directed, use it as long as suggested by your surgeon or therapist, typically at least 4-6 weeks.  EXERCISES  Results after joint replacement surgery are often greatly improved when you follow the exercise, range of motion and muscle strengthening exercises prescribed by your doctor. Safety measures are also important to protect the joint from further injury. Any time any of these exercises cause you to have increased pain or swelling, decrease what you are doing until you are comfortable again and then slowly increase them. If you have problems or questions, call your caregiver or physical therapist for advice.   Rehabilitation is important following a joint replacement. After just a few days of immobilization, the muscles of the leg can become weakened and shrink (atrophy).  These exercises are designed to build up the tone and strength of the thigh and leg muscles and to improve motion. Often times heat used for twenty to thirty minutes before working out will loosen up your tissues and help with improving the range of motion but do not use heat for the first two weeks following surgery (sometimes heat can increase post-operative swelling).   These exercises can be done on a training (exercise) mat, on the floor, on a table or on a bed. Use whatever works the best and is most comfortable for you.    Use music or television while you are exercising so that the exercises are a pleasant break in your day. This will make your life  better with the exercises acting as a break in your routine that you can look forward to.   Perform all exercises about fifteen times, three times per day or as directed.  You should exercise both the operative leg and the other leg as well.  Exercises include:   Quad Sets - Tighten up the muscle on the front of the thigh (Quad) and hold for 5-10 seconds.   Straight Leg Raises - With your knee straight (if you were given a brace, keep it on), lift the leg to 60 degrees, hold  for 3 seconds, and slowly lower the leg.  Perform this exercise against resistance later as your leg gets stronger.  Leg Slides: Lying on your back, slowly slide your foot toward your buttocks, bending your knee up off the floor (only go as far as is comfortable). Then slowly slide your foot back down until your leg is flat on the floor again.  Angel Wings: Lying on your back spread your legs to the side as far apart as you can without causing discomfort.  Hamstring Strength:  Lying on your back, push your heel against the floor with your leg straight by tightening up the muscles of your buttocks.  Repeat, but this time bend your knee to a comfortable angle, and push your heel against the floor.  You may put a pillow under the heel to make it more comfortable if necessary.   A rehabilitation program following joint replacement surgery can speed recovery and prevent re-injury in the future due to weakened muscles. Contact your doctor or a physical therapist for more information on knee rehabilitation.   CONSTIPATION:  Constipation is defined medically as fewer than three stools per week and severe constipation as less than one stool per week.  Even if you have a regular bowel pattern at home, your normal regimen is likely to be disrupted due to multiple reasons following surgery.  Combination of anesthesia, postoperative narcotics, change in appetite and fluid intake all can affect your bowels.   YOU MUST use at least one of the following options; they are listed in order of increasing strength to get the job done.  They are all available over the counter, and you may need to use some, POSSIBLY even all of these options:    Drink plenty of fluids (prune juice may be helpful) and high fiber foods Colace 100 mg by mouth twice a day  Senokot for constipation as directed and as needed Dulcolax (bisacodyl), take with full glass of water   Miralax  (polyethylene glycol) once or twice a day as needed.  If you  have tried all these things and are unable to have a bowel movement in the first 3-4 days after surgery call either your surgeon or your primary doctor.    If you experience loose stools or diarrhea, hold the medications until you stool forms back up.  If your symptoms do not get better within 1 week or if they get worse, check with your doctor.  If you experience the worst abdominal pain ever or develop nausea or vomiting, please contact the office immediately for further recommendations for treatment.  ITCHING:  If you experience itching with your medications, try taking only a single pain pill, or even half a pain pill at a time.  You can also use Benadryl  over the counter for itching or also to help with sleep.   TED HOSE STOCKINGS:  Use stockings on both legs until for at least 2 weeks or  as directed by physician office. They may be removed at night for sleeping.  MEDICATIONS:  See your medication summary on the "After Visit Summary" that nursing will review with you.  You may have some home medications which will be placed on hold until you complete the course of blood thinner medication.  It is important for you to complete the blood thinner medication as prescribed.  Blood clot prevention (DVT Prophylaxis): After surgery you are at an increased risk for a blood clot.  You were prescribed a blood thinner, Eliquis , to be taken twice daily for a total of 4 weeks from surgery to help reduce your risk of getting a blood clot.  Signs of a pulmonary embolus (blood clot in the lungs) include sudden short of breath, feeling lightheaded or dizzy, chest pain with a deep breath, rapid pulse rapid breathing.  Signs of a blood clot in your arms or legs include new unexplained swelling and cramping, warm, red or darkened skin around the painful area.  Please call the office or 911 right away if these signs or symptoms develop.  PRECAUTIONS:   If you experience chest pain or shortness of breath - call 911  immediately for transfer to the hospital emergency department.   If you develop a fever greater that 101 F, purulent drainage from wound, increased redness or drainage from wound, foul odor from the wound/dressing, or calf pain - CONTACT YOUR SURGEON.                                                   FOLLOW-UP APPOINTMENTS:  If you do not already have a post-op appointment, please call the office for an appointment to be seen by your surgeon.  Guidelines for how soon to be seen are listed in your "After Visit Summary", but are typically between 2-3 weeks after surgery.  If you have a specialized bandage, you may be told to follow up 1 week after surgery.  POST-OPERATIVE OPIOID TAPER INSTRUCTIONS: It is important to wean off of your opioid medication as soon as possible. If you do not need pain medication after your surgery it is ok to stop day one. Opioids include: Codeine, Hydrocodone (Norco, Vicodin), Oxycodone (Percocet, oxycontin ) and hydromorphone  amongst others.  Long term and even short term use of opiods can cause: Increased pain response Dependence Constipation Depression Respiratory depression And more.  Withdrawal symptoms can include Flu like symptoms Nausea, vomiting And more Techniques to manage these symptoms Hydrate well Eat regular healthy meals Stay active Use relaxation techniques(deep breathing, meditating, yoga) Do Not substitute Alcohol  to help with tapering If you have been on opioids for less than two weeks and do not have pain than it is ok to stop all together.  Plan to wean off of opioids This plan should start within one week post op of your joint replacement. Maintain the same interval or time between taking each dose and first decrease the dose.  Cut the total daily intake of opioids by one tablet each day Next start to increase the time between doses. The last dose that should be eliminated is the evening dose.   MAKE SURE YOU:  Understand these  instructions.  Get help right away if you are not doing well or get worse.    Thank you for letting us  be a part of your medical care team.  It is a privilege we respect greatly.  We hope these instructions will help you stay on track for a fast and full recovery!        Information on my medicine - ELIQUIS  (apixaban )  This medication education was reviewed with me or my healthcare representative as part of my discharge preparation.  The pharmacist that spoke with me during my hospital stay was:    Why was Eliquis  prescribed for you? Eliquis  was prescribed for you to reduce the risk of blood clots forming after orthopedic surgery.    What do You need to know about Eliquis ? Take your Eliquis  TWICE DAILY - one tablet in the morning and one tablet in the evening with or without food.  It would be best to take the dose about the same time each day.  If you have difficulty swallowing the tablet whole please discuss with your pharmacist how to take the medication safely.  Take Eliquis  exactly as prescribed by your doctor and DO NOT stop taking Eliquis  without talking to the doctor who prescribed the medication.  Stopping without other medication to take the place of Eliquis  may increase your risk of developing a clot.  After discharge, you should have regular check-up appointments with your healthcare provider that is prescribing your Eliquis .  What do you do if you miss a dose? If a dose of ELIQUIS  is not taken at the scheduled time, take it as soon as possible on the same day and twice-daily administration should be resumed.  The dose should not be doubled to make up for a missed dose.  Do not take more than one tablet of ELIQUIS  at the same time.  Important Safety Information A possible side effect of Eliquis  is bleeding. You should call your healthcare provider right away if you experience any of the following: Bleeding from an injury or your nose that does not stop. Unusual  colored urine (red or dark brown) or unusual colored stools (red or black). Unusual bruising for unknown reasons. A serious fall or if you hit your head (even if there is no bleeding).  Some medicines may interact with Eliquis  and might increase your risk of bleeding or clotting while on Eliquis . To help avoid this, consult your healthcare provider or pharmacist prior to using any new prescription or non-prescription medications, including herbals, vitamins, non-steroidal anti-inflammatory drugs (NSAIDs) and supplements.  This website has more information on Eliquis  (apixaban ): http://[email or web address] .com/eliquis dena

## 2024-01-07 NOTE — Op Note (Signed)
 01/07/2024  10:34 AM  PATIENT:  Connor [name]   MRN: [id number]  PRE-OPERATIVE DIAGNOSIS: End-stage left hip osteoarthritis  POST-OPERATIVE DIAGNOSIS:  same  PROCEDURE: Cemented left total hip arthroplasty  PREOPERATIVE INDICATIONS:    Connor [name] is an 89 y.o. male who has a diagnosis of end-stage left hip osteoarthritis and elected for surgical management after failing conservative treatment.  The risks benefits and alternatives were discussed with the patient including but not limited to the risks of nonoperative treatment, versus surgical intervention including infection, bleeding, nerve injury, periprosthetic fracture, the need for revision surgery, dislocation, leg length discrepancy, blood clots, cardiopulmonary complications, morbidity, mortality, among others, and they were willing to proceed.     OPERATIVE REPORT     SURGEON:  Toribio Higashi, MD    ASSISTANT: Bernarda Mclean, PA-C, (Present throughout the entire procedure,  necessary for completion of procedure in a timely manner, assisting with retraction, instrumentation, and closure)     ANESTHESIA: General, spinal was attempted but unsuccessful  ESTIMATED BLOOD LOSS: 350cc    COMPLICATIONS:  None.     COMPONENTS:   [employer] Trident 256 mm press-fit acetabular shell, 6.5 peg screws x 2, neutral X3 polyethylene insert, Accolade C size #6 stem with 127 degree neck angle, 36+2.5 ceramic head Implant Name Type Inv. Item Serial No. Manufacturer Lot No. LRB No. Used Action  CEMENT BONE SIMPLEX SPEEDSET - [id number] Cement CEMENT BONE SIMPLEX SPEEDSET  [employer] [id number] Left 2 Implanted  INSERT 0 DEG POLY 36 F - [id number] Miscellaneous INSERT 0 DEG POLY 36 F  [employer] [id number] Left 1 Implanted  SCREW HEX LP 6.5X20 - [id number] Screw SCREW HEX LP 6.5X20  [employer] [id number] Left 1 Implanted  SCREW HEX LP 6.5X35 - [id number] Screw SCREW HEX LP 6.5X35  [employer] [id number] Left 1  Implanted  SHELL TRIDENT II CLUST SZ - [id number] Shell SHELL TRIDENT II CLUST SZ  [employer] [id number] Left 1 Implanted  CEMENT RESTRICTOR BONE PREP ST - [id number] Cement CEMENT RESTRICTOR BONE PREP ST  [employer] [id number] Left 1 Implanted  STEM FEM CEMT [id number] SZ6 127D - [id number] Stem STEM FEM CEMT [id number] SZ6 127D  [employer] [id number] Left 1 Implanted  HEAD BIOLOX HIP 36/+2.5 - [id number] Joint HEAD BIOLOX HIP 36/+2.5  [employer] [id number] Left 1 Implanted      PROCEDURE IN DETAIL:   The patient was met in the holding area and  identified.  The appropriate hip was identified and marked at the operative site.  The patient was then transported to the OR  and  placed under anesthesia.  At that point, the patient was  placed in the lateral decubitus position with the operative side up and  secured to the operating room table  and all bony prominences padded. A subaxillary role was also placed.    The operative lower extremity was prepped from the iliac crest to the distal leg.  Sterile draping was performed.  Preoperative antibiotics, 2 gm of ancef ,1 gm of Tranexamic Acid , and 8 mg of Decadron  administered. Time out was performed prior to incision.      A routine posterolateral approach was utilized via sharp dissection  carried down to the subcutaneous tissue.  Gross bleeders were Bovie coagulated.  The iliotibial band was identified and incised along the length of the skin incision through the glute max fascia.  Charnley retractor was placed with care to protect the sciatic nerve posteriorly.  With the hip  internally rotated, the piriformis tendon was identified and released from the femoral insertion and tagged with a #5 Ethibond.  A capsulotomy was then performed off the femoral insertion and also tagged with a #5 Ethibond.    The femoral neck was exposed, and I resected the femoral neck based on preoperative templating relative to the lesser  trochanter.    I then exposed the deep acetabulum, cleared out any tissue including the ligamentum teres.  After adequate visualization, I excised the labrum.  I then started reaming with a 50 mm reamer, first medializing to the floor of the cotyloid fossa, and then in the position of the cup aiming towards the greater sciatic notch, matching the version of the transverse acetabular ligament and tucked under the anterior wall. I reamed up to 56 mm reamer with good bony bed preparation and a 56 mm cup was chosen.  The real cup was then impacted into place.  Appropriate version and inclination was confirmed clinically matching their bony anatomy, and also with the use of the jig.  I placed 2 screws in the posterior superior quadrant to augment fixation.  A neutral liner was placed and impacted. It was confirmed to be appropriately seated and the acetabular retractors were removed.    I then prepared the proximal femur using the box cutter, Charnley awl, and then sequentially broached starting with 2 up to a size 6.  A trial broach, neck, and head was utilized, and I reduced the hip and it was found to have excellent stability.  There was no impingement with full extension and 90 degrees external rotation.  The hip was stable at the position of sleep and with 90 degrees flexion and 80degrees of internal rotation.  Leg lengths were also clinically assessed in the lateral position and felt to be equal.   A final femoral prosthesis size 6 was selected.   We then prepared canal for cementation.  The cement restrictor was measured and inserted distally.  The canal was then irrigated with the pulse lavage and 3 L of normal saline.  2 bags of Simplex cement were prepared.  Using the cement gun the cement was inserted distally and the canal was filled.  We then pressurized the canal. The real implant was then inserted matching the patient's native anteversion of approximately 30 degrees.  We then waited for 14  minutes for the cement to be fully set.  Excess cement was removed.  A lap was placed in the acetabulum prior to cementing was also removed and the acetabulum was assessed to make sure there was no cement or bone fragments.   I again trialed and selected a 36+ 2.61mm ball. The hip was then reduced and taken through a range of motion. There was no impingement with full extension and 90 degrees external rotation.  The hip was stable at the position of sleep and with 90 degrees flexion and 80 degrees of internal rotation. Leg lengths were  again assessed and felt to be restored.  We then opened, and I impacted the real head ball into place.  The posterior capsule was then closed with #5 Ethibond.  The piriformis was repaired through the base of the abductor tendon using a [place] suture passer.  I then irrigated the hip copiously with dilute Betadine and with normal saline pulse lavage. Periarticular injection was then performed with Exparel .   We repaired the fascia #1 barbed suture, followed by 0 barbed suture for the subcutaneous fat.  Skin was closed  with 2-0 Vicryl and 3-0 Monocryl.  Dermabond and Aquacel dressing were applied. The patient was then awakened and returned to PACU in stable and satisfactory condition.  Leg lengths in the supine position were assessed and felt to be clinically equal. There were no complications.  Post op recs: WB: WBAT LLE, No formal hip precautions Abx: ancef  Imaging: PACU pelvis Xray Dressing: Aquacell, keep intact until follow up DVT prophylaxis: Eliquis  2.5mg  BID starting POD1 Follow up: 2 weeks after surgery for a wound check with Dr. Edna at [facility].  Address: [address], [place], [place] [address]  Office Phone: [phone]   Toribio Edna, MD Orthopedic Surgeon

## 2024-01-07 NOTE — Anesthesia Procedure Notes (Signed)
 Procedure Name: Intubation Date/Time: 01/07/2024 8:56 AM  Performed by: Deeann Eva BROCKS, CRNAPre-anesthesia Checklist: Patient identified, Emergency Drugs available, Suction available and Patient being monitored Patient Re-evaluated:Patient Re-evaluated prior to induction Oxygen Delivery Method: Circle System Utilized Preoxygenation: Pre-oxygenation with 100% oxygen Induction Type: IV induction Ventilation: Mask ventilation without difficulty Laryngoscope Size: Mac and 4 Grade View: Grade II Tube type: Oral Tube size: 8.0 mm Number of attempts: 1 Airway Equipment and Method: Stylet Placement Confirmation: ETT inserted through vocal cords under direct vision, positive ETCO2 and breath sounds checked- equal and bilateral Secured at: 23 cm Tube secured with: Tape Dental Injury: Teeth and Oropharynx as per pre-operative assessment

## 2024-01-07 NOTE — Interval H&P Note (Signed)
The patient has been re-examined, and the chart reviewed, and there have been no interval changes to the documented history and physical.    Plan for L THA for Left hip OA  The operative side was examined and the patient was confirmed to have sensation to DPN, SPN, TN intact, Motor EHL, ext, flex 5/5, and DP 2+, PT 2+, No significant edema.   The risks, benefits, and alternatives have been discussed at length with patient, and the patient is willing to proceed.  Left hip marked. Consent has been signed.

## 2024-01-07 NOTE — Transfer of Care (Signed)
 Immediate Anesthesia Transfer of Care Note  Patient: Connor [name]  Procedure(s) Performed: ARTHROPLASTY, HIP, TOTAL,POSTERIOR APPROACH (Left: Hip)  Patient Location: PACU  Anesthesia Type:General  Level of Consciousness: awake and alert   Airway & Oxygen Therapy: Patient Spontanous Breathing and Patient connected to face mask oxygen  Post-op Assessment: Report given to RN and Post -op Vital signs reviewed and stable  Post vital signs: Reviewed and stable  Last Vitals:  Vitals Value Taken Time  BP 144/92 01/07/24 11:03  Temp    Pulse 79 01/07/24 11:05  Resp 14 01/07/24 11:05  SpO2 100 % 01/07/24 11:05  Vitals shown include unfiled device data.  Last Pain:  Vitals:   01/07/24 0730  TempSrc:   PainSc: 0-No pain         Complications: No notable events documented.

## 2024-01-08 ENCOUNTER — Encounter (HOSPITAL_COMMUNITY): Payer: Self-pay | Admitting: Orthopedic Surgery

## 2024-01-08 ENCOUNTER — Ambulatory Visit: Admitting: Podiatry

## 2024-01-08 DIAGNOSIS — M1612 Unilateral primary osteoarthritis, left hip: Secondary | ICD-10-CM | POA: Diagnosis not present

## 2024-01-08 DIAGNOSIS — I110 Hypertensive heart disease with heart failure: Secondary | ICD-10-CM | POA: Diagnosis not present

## 2024-01-08 DIAGNOSIS — K219 Gastro-esophageal reflux disease without esophagitis: Secondary | ICD-10-CM | POA: Diagnosis not present

## 2024-01-08 DIAGNOSIS — I4891 Unspecified atrial fibrillation: Secondary | ICD-10-CM | POA: Diagnosis not present

## 2024-01-08 DIAGNOSIS — Z79899 Other long term (current) drug therapy: Secondary | ICD-10-CM | POA: Diagnosis not present

## 2024-01-08 DIAGNOSIS — Z7982 Long term (current) use of aspirin: Secondary | ICD-10-CM | POA: Diagnosis not present

## 2024-01-08 DIAGNOSIS — F1090 Alcohol use, unspecified, uncomplicated: Secondary | ICD-10-CM | POA: Diagnosis not present

## 2024-01-08 DIAGNOSIS — E785 Hyperlipidemia, unspecified: Secondary | ICD-10-CM | POA: Diagnosis not present

## 2024-01-08 DIAGNOSIS — I5040 Unspecified combined systolic (congestive) and diastolic (congestive) heart failure: Secondary | ICD-10-CM | POA: Diagnosis not present

## 2024-01-08 LAB — BASIC METABOLIC PANEL WITH GFR
Anion gap: 10 (ref 5–15)
BUN: 17 mg/dL (ref 8–23)
CO2: 24 mmol/L (ref 22–32)
Calcium: 8.1 mg/dL — ABNORMAL LOW (ref 8.9–10.3)
Chloride: 103 mmol/L (ref 98–111)
Creatinine, Ser: 0.76 mg/dL (ref 0.61–1.24)
GFR, Estimated: >60 mL/min (ref >60)
Glucose, Bld: 151 mg/dL — ABNORMAL HIGH (ref 70–99)
Potassium: 4.2 mmol/L (ref 3.5–5.1)
Sodium: 137 mmol/L (ref 135–145)

## 2024-01-08 LAB — CBC
HCT: 27.4 % — ABNORMAL LOW (ref 39.0–52.0)
Hemoglobin: 8.5 g/dL — ABNORMAL LOW (ref 13.0–17.0)
MCH: 33.3 pg (ref 26.0–34.0)
MCHC: 31 g/dL (ref 30.0–36.0)
MCV: 107.5 fL — ABNORMAL HIGH (ref 80.0–100.0)
Platelets: 104 K/uL — ABNORMAL LOW (ref 150–400)
RBC: 2.55 MIL/uL — ABNORMAL LOW (ref 4.22–5.81)
RDW: 13.2 % (ref 11.5–15.5)
WBC: 9 K/uL (ref 4.0–10.5)
nRBC: 0 % (ref 0.0–0.2)

## 2024-01-08 NOTE — Plan of Care (Signed)
 Pt is alert and oriented x 4. Up with 1 assist with walker limited mobility. With long distance patient may need 2 assist. Foley in place due to remove this am. Vitals stable. Pt Apison removed per request. Sating in 90-94%. Received Robaxin  x 1 and Oxycodone  x 1 dose. Last bm 9/2. Aquacel to left hip with ice packs.  Problem: Education: Goal: Knowledge of General Education information will improve Description: Including pain rating scale, medication(s)/side effects and non-pharmacologic comfort measures Outcome: Progressing   Problem: Health Behavior/Discharge Planning: Goal: Ability to manage health-related needs will improve Outcome: Progressing   Problem: Clinical Measurements: Goal: Ability to maintain clinical measurements within normal limits will improve Outcome: Progressing Goal: Will remain free from infection Outcome: Progressing Goal: Diagnostic test results will improve Outcome: Progressing Goal: Respiratory complications will improve Outcome: Progressing Goal: Cardiovascular complication will be avoided Outcome: Progressing   Problem: Activity: Goal: Risk for activity intolerance will decrease Outcome: Progressing   Problem: Nutrition: Goal: Adequate nutrition will be maintained Outcome: Progressing   Problem: Coping: Goal: Level of anxiety will decrease Outcome: Progressing   Problem: Elimination: Goal: Will not experience complications related to bowel motility Outcome: Progressing Goal: Will not experience complications related to urinary retention Outcome: Progressing   Problem: Pain Managment: Goal: General experience of comfort will improve and/or be controlled Outcome: Progressing   Problem: Safety: Goal: Ability to remain free from injury will improve Outcome: Progressing   Problem: Skin Integrity: Goal: Risk for impaired skin integrity will decrease Outcome: Progressing   Problem: Education: Goal: Knowledge of the prescribed therapeutic regimen  will improve Outcome: Progressing Goal: Understanding of discharge needs will improve Outcome: Progressing   Problem: Activity: Goal: Ability to avoid complications of mobility impairment will improve Outcome: Progressing Goal: Ability to tolerate increased activity will improve Outcome: Progressing   Problem: Clinical Measurements: Goal: Postoperative complications will be avoided or minimized Outcome: Progressing   Problem: Pain Management: Goal: Pain level will decrease with appropriate interventions Outcome: Progressing   Problem: Skin Integrity: Goal: Will show signs of wound healing Outcome: Progressing

## 2024-01-08 NOTE — Progress Notes (Signed)
     Subjective:  Patient reports pain as mild.  Did well with PT yesterday mobilizing 25 feet. Possible dc home today. Denies distal n/t. No new concerns.  Yesterday's total administered Morphine  [place]ig[place] Equivalents: 102   Objective:   VITALS:   Vitals:   01/07/24 2127 01/07/24 2154 01/08/24 0107 01/08/24 0559  BP: 108/76 111/65 124/70 (!) 112/58  Pulse: (!) 59 (!) 59 (!) 56 (!) 55  Resp: 18 17 16 15   Temp: (!) 97.5 F (36.4 C) 97.8 F (36.6 C) 98.2 F (36.8 C) 98.2 F (36.8 C)  TempSrc: Oral Oral  Oral  SpO2: 96% 91% 94% 94%  Weight:      Height:        Sensation intact distally Intact pulses distally Dorsiflexion/Plantar flexion intact Incision: dressing C/D/I Compartment soft    Lab Results  Component Value Date   WBC 9.0 01/08/2024   HGB 8.5 (L) 01/08/2024   HCT 27.4 (L) 01/08/2024   MCV 107.5 (H) 01/08/2024   PLT 104 (L) 01/08/2024   BMET    Component Value Date/Time   NA 137 01/08/2024 0329   NA 146 (H) 07/01/2022 1104   K 4.2 01/08/2024 0329   CL 103 01/08/2024 0329   CO2 24 01/08/2024 0329   GLUCOSE 151 (H) 01/08/2024 0329   BUN 17 01/08/2024 0329   BUN 19 07/01/2022 1104   CREATININE 0.76 01/08/2024 0329   CREATININE 0.76 05/27/2023 1048   CREATININE 0.69 (L) 02/26/2016 1022   CALCIUM  8.1 (L) 01/08/2024 0329   EGFR 85 07/01/2022 1104   GFRNONAA >60 01/08/2024 0329   GFRNONAA >60 05/27/2023 1048      Xray: THA components in good position, no adverse features.  Assessment/Plan: 1 Day Post-Op   Principal Problem:   Primary osteoarthritis of left hip  S/p L THA 01/07/24  Post op recs: WB: WBAT LLE, No formal hip precautions Abx: ancef  Imaging: PACU pelvis Xray Dressing: Aquacell, keep intact until follow up DVT prophylaxis: Eliquis  2.5mg  BID starting POD1 Follow up: 2 weeks after surgery for a wound check with Dr. Edna at [facility].  Address: [address], [place], [place] [address]  Office Phone:  [phone]    TORIBIO DELENA EDNA 01/08/2024, 6:40 AM   TORIBIO Edna, MD  Contact information:   [phone] 7am-5pm epic message Dr. Edna, or call office for patient follow up: [phone] After hours and holidays please check [email or web address] for group call information for [facility]

## 2024-01-08 NOTE — Discharge Summary (Signed)
 Physician Discharge Summary  Patient ID: Connor [name] MRN: [id number] DOB/AGE: [birth year] 89 y.o.  Admit date: 01/07/2024 Discharge date: 01/08/2024  Admission Diagnoses:  Primary osteoarthritis of left hip  Discharge Diagnoses:  Principal Problem:   Primary osteoarthritis of left hip   Past Medical History:  Diagnosis Date   Allergy 1985   Anemia    Benign localized prostatic hyperplasia with lower urinary tract symptoms (LUTS)    urologist-- dr matilda   Cancer [facility]) 07/2018   prostate   Cataract 12/2012   CHF (congestive heart failure) (HCC) ?   Chronic anticoagulation    Xarelto  for afib   Chronic constipation    Chronic cystitis    Chronic kidney disease    Clotting disorder (HCC)    GERD (gastroesophageal reflux disease)    Gross hematuria    Heart murmur ?   History of DVT of lower extremity 06/08/2010   right femoral dvt dx post op total hip 05-08-2010   History of gastric ulcer    remote   History of kidney stones    one stone. remote hx   Hypercholesteremia    Hyperlipidemia ?   Hypertension    Dx by Dr. Salena Skill around age  81    Macular degeneration    dry in both eyes,  no injections  sees Dr. Alvia   NICM (nonischemic cardiomyopathy) [facility])    per echo 06-11-2017,  ef 45-50% with diffuse hypokinesis,  G1DD   OSA on CPAP 2017   compliant with CPAP.  managed by Dr Jude.   Osteoarthritis    Other urethral stricture, male, meatal    chronic;  pt self dilates   Paroxysmal atrial fibrillation [facility]) cardiologist-- dr kelsie   first dx 2009/   a. documented by Dr Comer on office EKG 9/12. b. maintained on Tikosyn  and Xarelto . c. s/p DCCV 's.  d.  x3  EP w/ ablation atrial fib last one 12-05-2016   Pre-diabetes    Premature ventricular contraction    Presence of Watchman left atrial appendage closure device 05/30/2022   Watchman FLX 24mm by Dr. Cindie   PVC's (premature ventricular contractions)    RA (rheumatoid arthritis) [facility])     rheumatologist--- Dr LORELEI Jacob,  treated with Humira   RBBB (right bundle branch block)    Hx of a fib, ablation Aug 2018   PVCs,   Pt had Watchman placed 05-30-2022   Rheumatoid arthritis (HCC)    Sleep apnea    Ulcer    Vitamin D  deficiency    Wears hearing aid in both ears     Surgeries: Procedure(s): ARTHROPLASTY, HIP, TOTAL,POSTERIOR APPROACH on 01/07/2024   Consultants (if any):   Discharged Condition: Improved  Hospital Course: Connor [name] is an 89 y.o. male who was admitted 01/07/2024 with a diagnosis of Primary osteoarthritis of left hip and went to the operating room on 01/07/2024 and underwent the above named procedures.    He was given perioperative antibiotics:  Anti-infectives (From admission, onward)    Start     Dose/Rate Route Frequency Ordered Stop   01/07/24 1430  ceFAZolin  (ANCEF ) IVPB 2g/100 mL premix        2 g 200 mL/hr over 30 Minutes Intravenous Every 6 hours 01/07/24 1308 01/07/24 2137   01/07/24 0830  ceFAZolin  (ANCEF ) IVPB 2g/100 mL premix        2 g 200 mL/hr over 30 Minutes Intravenous  Once 01/07/24 0817 01/07/24 0834  01/07/24 0817  ceFAZolin  (ANCEF ) 2-4 GM/100ML-% IVPB       Note to Pharmacy: Bryant, Tonie B: cabinet override      01/07/24 0817 01/07/24 2029     .  He was given sequential compression devices, early ambulation, and eliquis  for DVT prophylaxis.  He benefited maximally from the hospital stay and there were no complications.    Recent vital signs:  Vitals:   01/08/24 0107 01/08/24 0559  BP: 124/70 (!) 112/58  Pulse: (!) 56 (!) 55  Resp: 16 15  Temp: 98.2 F (36.8 C) 98.2 F (36.8 C)  SpO2: 94% 94%    Recent laboratory studies:  Lab Results  Component Value Date   HGB 8.5 (L) 01/08/2024   HGB 12.7 (L) 01/01/2024   HGB 13.0 05/27/2023   Lab Results  Component Value Date   WBC 9.0 01/08/2024   PLT 104 (L) 01/08/2024   Lab Results  Component Value Date   INR 1.1 01/03/2022   Lab Results  Component Value  Date   NA 137 01/08/2024   K 4.2 01/08/2024   CL 103 01/08/2024   CO2 24 01/08/2024   BUN 17 01/08/2024   CREATININE 0.76 01/08/2024   GLUCOSE 151 (H) 01/08/2024    Discharge Medications:   Allergies as of 01/08/2024       Reactions   Penicillins Itching, Swelling, Other (See Comments)   Did it involve swelling of the face/tongue/throat, SOB, or low BP? No Did it involve sudden or severe rash/hives, skin peeling, or any reaction on the inside of your mouth or nose? No Did you need to seek medical attention at a hospital or doctor's office? No When did it last happen?      30 + years If all above answers are "NO", may proceed with cephalosporin use.        Medication List     STOP taking these medications    aspirin  EC 81 MG tablet       TAKE these medications    acetaminophen  500 MG tablet Commonly known as: TYLENOL  Take 2 tablets (1,000 mg total) by mouth every 8 (eight) hours as needed. What changed:  when to take this reasons to take this   apixaban  2.5 MG Tabs tablet Commonly known as: Eliquis  Take 1 tablet (2.5 mg total) by mouth 2 (two) times daily.   atorvastatin  10 MG tablet Commonly known as: LIPITOR TAKE 1 TABLET BY MOUTH EVERY DAY   celecoxib  100 MG capsule Commonly known as: CeleBREX  Take 1 capsule (100 mg total) by mouth 2 (two) times daily for 14 days.   cephALEXin  500 MG capsule Commonly known as: KEFLEX  Take 2,000 mg by mouth as directed. Dental appointments   CRANBERRY PO Take 9,600 mg by mouth 2 (two) times daily. 4800 mg each   Humira (2 Pen) 40 MG/0.4ML pen Generic drug: adalimumab Inject 40 mg as directed every 7 (seven) days. Every Tuesday   lisinopril  40 MG tablet Commonly known as: ZESTRIL  TAKE 1 TABLET BY MOUTH EVERY DAY   Melatonin 10 MG Chew Chew 3 Pieces of gum by mouth at bedtime. gummy   methocarbamol  500 MG tablet Commonly known as: ROBAXIN  Take 1 tablet (500 mg total) by mouth every 8 (eight) hours as needed for  up to 10 days for muscle spasms.   metoprolol  succinate 50 MG 24 hr tablet Commonly known as: TOPROL -XL Take 1 tablet (50 mg total) by mouth 2 (two) times daily. Take with or immediately following  a meal.   ondansetron  4 MG tablet Commonly known as: Zofran  Take 1 tablet (4 mg total) by mouth every 8 (eight) hours as needed for up to 14 days for nausea or vomiting.   oxyCODONE  5 MG immediate release tablet Commonly known as: Roxicodone  Take 1 tablet (5 mg total) by mouth every 4 (four) hours as needed for up to 7 days for severe pain (pain score 7-10) or moderate pain (pain score 4-6).   pantoprazole  40 MG tablet Commonly known as: PROTONIX  TAKE 1 TABLET BY MOUTH EVERY DAY   PreserVision AREDS 2 Caps Take 1 capsule by mouth 2 (two) times a day.   senna 8.6 MG Tabs tablet Commonly known as: SENOKOT Take 2 tablets by mouth at bedtime.   solifenacin 10 MG tablet Commonly known as: VESICARE Take 10 mg by mouth at bedtime.   tamsulosin  0.4 MG Caps capsule Commonly known as: Flomax  Take 1 capsule (0.4 mg total) by mouth daily after supper.   Testosterone  20.25 MG/ACT (1.62%) Gel Apply 1 Pump topically once a week. Apply to shoulders   THERATEARS OP Apply 1 drop to eye daily as needed (dry eyes).   vitamin B-12 500 MCG tablet Commonly known as: CYANOCOBALAMIN  Take 500 mcg by mouth every evening.   Vitamin D3 125 MCG (5000 UT) Tabs Take 5,000 Units by mouth every morning.        Diagnostic Studies: DG HIP UNILAT W OR W/O PELVIS 2-3 VIEWS LEFT Result Date: 01/07/2024 CLINICAL DATA:  Postop. EXAM: DG HIP (WITH OR WITHOUT PELVIS) 2-3V LEFT COMPARISON:  None Available. FINDINGS: Left hip arthroplasty in expected alignment. No periprosthetic lucency or fracture. Recent postsurgical change includes air and edema in the soft tissues. Previous right hip arthroplasty. IMPRESSION: Left hip arthroplasty without immediate postoperative complication. Electronically Signed   By: Andrea Gasman M.D.   On: 01/07/2024 13:32    Disposition: Discharge disposition: 01-Home or Self Care       Discharge Instructions     Call MD / Call 911   Complete by: As directed    If you experience chest pain or shortness of breath, CALL 911 and be transported to the hospital emergency room.  If you develope a fever above 101 F, pus (white drainage) or increased drainage or redness at the wound, or calf pain, call your surgeon's office.   Constipation Prevention   Complete by: As directed    Drink plenty of fluids.  Prune juice may be helpful.  You may use a stool softener, such as Colace (over the counter) 100 mg twice a day.  Use MiraLax  (over the counter) for constipation as needed.   Diet - low sodium heart healthy   Complete by: As directed    Increase activity slowly as tolerated   Complete by: As directed    Post-operative opioid taper instructions:   Complete by: As directed    POST-OPERATIVE OPIOID TAPER INSTRUCTIONS: It is important to wean off of your opioid medication as soon as possible. If you do not need pain medication after your surgery it is ok to stop day one. Opioids include: Codeine, Hydrocodone (Norco, Vicodin), Oxycodone (Percocet, oxycontin ) and hydromorphone  amongst others.  Long term and even short term use of opiods can cause: Increased pain response Dependence Constipation Depression Respiratory depression And more.  Withdrawal symptoms can include Flu like symptoms Nausea, vomiting And more Techniques to manage these symptoms Hydrate well Eat regular healthy meals Stay active Use relaxation techniques(deep breathing, meditating, yoga) Do Not  substitute Alcohol  to help with tapering If you have been on opioids for less than two weeks and do not have pain than it is ok to stop all together.  Plan to wean off of opioids This plan should start within one week post op of your joint replacement. Maintain the same interval or time between taking each  dose and first decrease the dose.  Cut the total daily intake of opioids by one tablet each day Next start to increase the time between doses. The last dose that should be eliminated is the evening dose.           Follow-up Information     Edna Toribio LABOR, MD. Go on 01/22/2024.   Specialty: Orthopedic Surgery Why: your appointment is scheduled for 2:45. Contact information: [address] [place] [address] [phone]         [facility] Follow up.   Why: HHPT will provide 6 home visits prior to starting outpatient physical therapy        [facility] [place]. Go on 01/23/2024.   Why: your outpatient physical therapy has been ordered. they will call you with a time Contact information: [phone]                   Discharge Instructions      INSTRUCTIONS AFTER JOINT REPLACEMENT   Remove items at home which could result in a fall. This includes throw rugs or furniture in walking pathways ICE to the affected joint every three hours while awake for 30 minutes at a time, for at least the first 3-5 days, and then as needed for pain and swelling.  Continue to use ice for pain and swelling. You may notice swelling that will progress down to the foot and ankle.  This is normal after surgery.  Elevate your leg when you are not up walking on it.   Continue to use the breathing machine you got in the hospital (incentive spirometer) which will help keep your temperature down.  It is common for your temperature to cycle up and down following surgery, especially at night when you are not up moving around and exerting yourself.  The breathing machine keeps your lungs expanded and your temperature down.  DIET:  As you were doing prior to hospitalization, we recommend a well-balanced diet.  DRESSING / WOUND CARE / SHOWERING:  Keep the surgical dressing until follow up.  The dressing is water  proof, so you can shower without any extra covering.   IF THE DRESSING FALLS OFF or the wound gets wet inside, change the dressing with sterile gauze.  Please use good hand washing techniques before changing the dressing.  Do not use any lotions or creams on the incision until instructed by your surgeon.    ACTIVITY  Increase activity slowly as tolerated, but follow the weight bearing instructions below.   No driving for 6 weeks or until further direction given by your physician.  You cannot drive while taking narcotics.  No lifting or carrying greater than 10 lbs. until further directed by your surgeon. Avoid periods of inactivity such as sitting longer than an hour when not asleep. This helps prevent blood clots.  You may return to work once you are authorized by your doctor.   WEIGHT BEARING: Weight bearing as tolerated with assist device (walker, cane, etc) as directed, use it as long as suggested by your surgeon or therapist, typically at least 4-6 weeks.  EXERCISES  Results after joint replacement surgery are often greatly improved when you follow the exercise, range of motion and muscle strengthening exercises prescribed by your doctor. Safety measures are also important to protect the joint from further injury. Any time any of these exercises cause you to have increased pain or swelling, decrease what you are doing until you are comfortable again and then slowly increase them. If you have problems or questions, call your caregiver or physical therapist for advice.   Rehabilitation is important following a joint replacement. After just a few days of immobilization, the muscles of the leg can become weakened and shrink (atrophy).  These exercises are designed to build up the tone and strength of the thigh and leg muscles and to improve motion. Often times heat used for twenty to thirty minutes before working out will loosen up your tissues and help with improving the range of motion but do not use heat for the first two weeks following surgery  (sometimes heat can increase post-operative swelling).   These exercises can be done on a training (exercise) mat, on the floor, on a table or on a bed. Use whatever works the best and is most comfortable for you.    Use music or television while you are exercising so that the exercises are a pleasant break in your day. This will make your life better with the exercises acting as a break in your routine that you can look forward to.   Perform all exercises about fifteen times, three times per day or as directed.  You should exercise both the operative leg and the other leg as well.  Exercises include:   Quad Sets - Tighten up the muscle on the front of the thigh (Quad) and hold for 5-10 seconds.   Straight Leg Raises - With your knee straight (if you were given a brace, keep it on), lift the leg to 60 degrees, hold for 3 seconds, and slowly lower the leg.  Perform this exercise against resistance later as your leg gets stronger.  Leg Slides: Lying on your back, slowly slide your foot toward your buttocks, bending your knee up off the floor (only go as far as is comfortable). Then slowly slide your foot back down until your leg is flat on the floor again.  Angel Wings: Lying on your back spread your legs to the side as far apart as you can without causing discomfort.  Hamstring Strength:  Lying on your back, push your heel against the floor with your leg straight by tightening up the muscles of your buttocks.  Repeat, but this time bend your knee to a comfortable angle, and push your heel against the floor.  You may put a pillow under the heel to make it more comfortable if necessary.   A rehabilitation program following joint replacement surgery can speed recovery and prevent re-injury in the future due to weakened muscles. Contact your doctor or a physical therapist for more information on knee rehabilitation.   CONSTIPATION:  Constipation is defined medically as fewer than three stools per week and  severe constipation as less than one stool per week.  Even if you have a regular bowel pattern at home, your normal regimen is likely to be disrupted due to multiple reasons following surgery.  Combination of anesthesia, postoperative narcotics, change in appetite and fluid intake all can affect your bowels.   YOU MUST use at least one of the following options; they are listed in order of increasing strength to  get the job done.  They are all available over the counter, and you may need to use some, POSSIBLY even all of these options:    Drink plenty of fluids (prune juice may be helpful) and high fiber foods Colace 100 mg by mouth twice a day  Senokot for constipation as directed and as needed Dulcolax (bisacodyl), take with full glass of water   Miralax  (polyethylene glycol) once or twice a day as needed.  If you have tried all these things and are unable to have a bowel movement in the first 3-4 days after surgery call either your surgeon or your primary doctor.    If you experience loose stools or diarrhea, hold the medications until you stool forms back up.  If your symptoms do not get better within 1 week or if they get worse, check with your doctor.  If you experience the worst abdominal pain ever or develop nausea or vomiting, please contact the office immediately for further recommendations for treatment.  ITCHING:  If you experience itching with your medications, try taking only a single pain pill, or even half a pain pill at a time.  You can also use Benadryl  over the counter for itching or also to help with sleep.   TED HOSE STOCKINGS:  Use stockings on both legs until for at least 2 weeks or as directed by physician office. They may be removed at night for sleeping.  MEDICATIONS:  See your medication summary on the "After Visit Summary" that nursing will review with you.  You may have some home medications which will be placed on hold until you complete the course of blood thinner  medication.  It is important for you to complete the blood thinner medication as prescribed.  Blood clot prevention (DVT Prophylaxis): After surgery you are at an increased risk for a blood clot.  You were prescribed a blood thinner, Eliquis , to be taken twice daily for a total of 4 weeks from surgery to help reduce your risk of getting a blood clot.  Signs of a pulmonary embolus (blood clot in the lungs) include sudden short of breath, feeling lightheaded or dizzy, chest pain with a deep breath, rapid pulse rapid breathing.  Signs of a blood clot in your arms or legs include new unexplained swelling and cramping, warm, red or darkened skin around the painful area.  Please call the office or 911 right away if these signs or symptoms develop.  PRECAUTIONS:   If you experience chest pain or shortness of breath - call 911 immediately for transfer to the hospital emergency department.   If you develop a fever greater that 101 F, purulent drainage from wound, increased redness or drainage from wound, foul odor from the wound/dressing, or calf pain - CONTACT YOUR SURGEON.                                                   FOLLOW-UP APPOINTMENTS:  If you do not already have a post-op appointment, please call the office for an appointment to be seen by your surgeon.  Guidelines for how soon to be seen are listed in your "After Visit Summary", but are typically between 2-3 weeks after surgery.  If you have a specialized bandage, you may be told to follow up 1 week after surgery.  POST-OPERATIVE OPIOID TAPER INSTRUCTIONS: It is  important to wean off of your opioid medication as soon as possible. If you do not need pain medication after your surgery it is ok to stop day one. Opioids include: Codeine, Hydrocodone (Norco, Vicodin), Oxycodone (Percocet, oxycontin ) and hydromorphone  amongst others.  Long term and even short term use of opiods can cause: Increased pain  response Dependence Constipation Depression Respiratory depression And more.  Withdrawal symptoms can include Flu like symptoms Nausea, vomiting And more Techniques to manage these symptoms Hydrate well Eat regular healthy meals Stay active Use relaxation techniques(deep breathing, meditating, yoga) Do Not substitute Alcohol  to help with tapering If you have been on opioids for less than two weeks and do not have pain than it is ok to stop all together.  Plan to wean off of opioids This plan should start within one week post op of your joint replacement. Maintain the same interval or time between taking each dose and first decrease the dose.  Cut the total daily intake of opioids by one tablet each day Next start to increase the time between doses. The last dose that should be eliminated is the evening dose.   MAKE SURE YOU:  Understand these instructions.  Get help right away if you are not doing well or get worse.    Thank you for letting us  be a part of your medical care team.  It is a privilege we respect greatly.  We hope these instructions will help you stay on track for a fast and full recovery!        Information on my medicine - ELIQUIS  (apixaban )  This medication education was reviewed with me or my healthcare representative as part of my discharge preparation.  The pharmacist that spoke with me during my hospital stay was:    Why was Eliquis  prescribed for you? Eliquis  was prescribed for you to reduce the risk of blood clots forming after orthopedic surgery.    What do You need to know about Eliquis ? Take your Eliquis  TWICE DAILY - one tablet in the morning and one tablet in the evening with or without food.  It would be best to take the dose about the same time each day.  If you have difficulty swallowing the tablet whole please discuss with your pharmacist how to take the medication safely.  Take Eliquis  exactly as prescribed by your doctor and DO NOT  stop taking Eliquis  without talking to the doctor who prescribed the medication.  Stopping without other medication to take the place of Eliquis  may increase your risk of developing a clot.  After discharge, you should have regular check-up appointments with your healthcare provider that is prescribing your Eliquis .  What do you do if you miss a dose? If a dose of ELIQUIS  is not taken at the scheduled time, take it as soon as possible on the same day and twice-daily administration should be resumed.  The dose should not be doubled to make up for a missed dose.  Do not take more than one tablet of ELIQUIS  at the same time.  Important Safety Information A possible side effect of Eliquis  is bleeding. You should call your healthcare provider right away if you experience any of the following: Bleeding from an injury or your nose that does not stop. Unusual colored urine (red or dark brown) or unusual colored stools (red or black). Unusual bruising for unknown reasons. A serious fall or if you hit your head (even if there is no bleeding).  Some medicines may interact with Eliquis   and might increase your risk of bleeding or clotting while on Eliquis . To help avoid this, consult your healthcare provider or pharmacist prior to using any new prescription or non-prescription medications, including herbals, vitamins, non-steroidal anti-inflammatory drugs (NSAIDs) and supplements.  This website has more information on Eliquis  (apixaban ): http://www.eliquis .com/eliquis dena     Signed: Ahmoni Edge A Daemion Mcniel 01/08/2024, 6:42 AM

## 2024-01-08 NOTE — Progress Notes (Signed)
 Physical Therapy Treatment Patient Details Name: Connor [name] MRN: [id number] DOB: [birth year] Today's Date: 01/08/2024   History of Present Illness Pt is 89 yo male s/p posterior L THA with no formal hip precautions on 01/07/24.  Pt with hx including but not limited to afib, prostate CA, OSA, cervical spondylosis, low back pain, HLD, HTN, OA, arthritis, RA, Bil TKA, R THA    PT Comments  POD # 1 pm session with Son present. General transfer comment: Had Son hands on assist Pt with all transfers using safety belt.  General Gait Details: Had Son hands on assist with amb a functional distance in hallway with walker and use of safety belt.  Educated on safe handling as well as expected activity level once home. General stair comments: with Son hands on using safety belt, assisted PT with navigating 2 steps using ONE rail and a cane.  Pt was able to recall proper sequencing. Then returned to room to perform some TE's following HEP handout.  Instructed on proper tech, freq as well as use of ICE.   Addressed all mobility questions, discussed appropriate activity, educated on use of ICE.  Pt ready for D/C to home.    If plan is discharge home, recommend the following: A lot of help with walking and/or transfers;A lot of help with bathing/dressing/bathroom;Assistance with cooking/housework;Help with stairs or ramp for entrance   Can travel by private vehicle        Equipment Recommendations  None recommended by PT    Recommendations for Other Services       Precautions / Restrictions Precautions Precautions: Fall Precaution/Restrictions Comments: No posterior hip precautions Restrictions Weight Bearing Restrictions Per Provider Order: No LLE Weight Bearing Per Provider Order: Weight bearing as tolerated     Mobility  Bed Mobility   General bed mobility comments: OOB in recliner    Transfers Overall transfer level: Needs assistance Equipment used: Rolling walker (2  wheels) Transfers: Sit to/from Stand Sit to Stand: Supervision, Contact guard assist           General transfer comment: Had Son hands on assist Pt with all transfers using safety belt.    Ambulation/Gait Ambulation/Gait assistance: Supervision, Contact guard assist Gait Distance (Feet): 45 Feet Assistive device: Rolling walker (2 wheels) Gait Pattern/deviations: Step-to pattern, Decreased stride length, Decreased weight shift to left Gait velocity: decreased     General Gait Details: Had Son hands on assist with amb a functional distance in hallway with walker and use of safety belt.  Educated on safe handling as well as expected activity level once home.   Stairs Stairs: Yes Stairs assistance: Contact guard assist Stair Management: One rail Right, Step to pattern, Forwards, With cane Number of Stairs: 2 General stair comments: with Son hands on using safety belt, assisted PT with navigating 2 steps using ONE rail and a cane.  Pt was able to recall proper sequencing.   Wheelchair Mobility     Tilt Bed    Modified Rankin (Stroke Patients Only)       Balance                                            Communication Communication Factors Affecting Communication: Hearing impaired  Cognition Arousal: Alert Behavior During Therapy: WFL for tasks assessed/performed   PT - Cognitive impairments: No apparent impairments  PT - Cognition Comments: AxO x 3 pleasant and motivated.  This is his 4th and final total joint replcement.  VERY HOH requiring repeat instructions. Following commands: Intact      Cueing Cueing Techniques: Verbal cues  Exercises  Total Hip Replacement TE's following HEP Handout 10 reps ankle pumps 05 reps knee presses 05 reps heel slides 05 reps SAQ's 05 reps ABD 05 reps all  standing TE's  Instructed how to use a belt loop to assist  Followed by ICE     General Comments         Pertinent Vitals/Pain Pain Assessment Pain Assessment: 0-10 Pain Score: 6  Pain Location: L hip with activity Pain Descriptors / Indicators: Discomfort, Guarding, Operative site guarding Pain Intervention(s): Monitored during session, Premedicated before session, Repositioned, Ice applied    Home Living                          Prior Function            PT Goals (current goals can now be found in the care plan section) Progress towards PT goals: Progressing toward goals    Frequency    7X/week      PT Plan      Co-evaluation              AM-PAC PT 6 Clicks Mobility   Outcome Measure  Help needed turning from your back to your side while in a flat bed without using bedrails?: A Little Help needed moving from lying on your back to sitting on the side of a flat bed without using bedrails?: A Little Help needed moving to and from a bed to a chair (including a wheelchair)?: A Little Help needed standing up from a chair using your arms (e.g., wheelchair or bedside chair)?: A Little Help needed to walk in hospital room?: A Little Help needed climbing 3-5 steps with a railing? : A Lot 6 Click Score: 17    End of Session Equipment Utilized During Treatment: Gait belt Activity Tolerance: Patient limited by fatigue Patient left: with chair alarm set;in chair;with call bell/phone within reach Nurse Communication: Mobility status PT Visit Diagnosis: Other abnormalities of gait and mobility (R26.89);Muscle weakness (generalized) (M62.81)     Time: 8656-8579 PT Time Calculation (min) (ACUTE ONLY): 37 min  Charges:    $Gait Training: 8-22 mins $Therapeutic Exercise: 8-22 mins PT General Charges $$ ACUTE PT VISIT: 1 Visit                    Katheryn Leap  PTA Acute  Rehabilitation Services Office M-F          [phone]

## 2024-01-08 NOTE — Care Management Obs Status (Signed)
 MEDICARE OBSERVATION STATUS NOTIFICATION   Patient Details  Name: Connor [name] MRN: [id number] Date of Birth: [birth year]   Medicare Observation Status Notification Given:  Yes    Gisella Alwine A [place] Mielke, LCSW 01/08/2024, 9:38 AM

## 2024-01-08 NOTE — Anesthesia Postprocedure Evaluation (Signed)
 Anesthesia Post Note  Patient: Connor [name]  Procedure(s) Performed: ARTHROPLASTY, HIP, TOTAL,POSTERIOR APPROACH (Left: Hip)     Patient location during evaluation: PACU Anesthesia Type: General Level of consciousness: awake Pain management: pain level controlled Vital Signs Assessment: post-procedure vital signs reviewed and stable Respiratory status: spontaneous breathing, nonlabored ventilation and respiratory function stable Cardiovascular status: blood pressure returned to baseline and stable Postop Assessment: no apparent nausea or vomiting Anesthetic complications: no   No notable events documented.  Last Vitals:  Vitals:   01/08/24 0941 01/08/24 0952  BP: (!) 112/58   Pulse: (!) 54 80  Resp: 18   Temp: 36.7 C   SpO2: 96%     Last Pain:  Vitals:   01/08/24 1549  TempSrc:   PainSc: 3    Pain Goal: Patients Stated Pain Goal: 2 (01/08/24 1504)                 Connor [name] P Yash Cacciola

## 2024-01-08 NOTE — Progress Notes (Signed)
 Physical Therapy Treatment Patient Details Name: Connor [name] MRN: [id number] DOB: 02-Mar-1934 Today's Date: 01/08/2024   History of Present Illness Pt is 89 yo male s/p posterior L THA with no formal hip precautions on 01/07/24.  Pt with hx including but not limited to afib, prostate CA, OSA, cervical spondylosis, low back pain, HLD, HTN, OA, arthritis, RA, Bil TKA, R THA    PT Comments  POD # 1 am session PT - Cognition Comments: AxO x 3 pleasant and motivated.  This is his 4th and final total joint replcement.  VERY HOH requiring repeat instructions. Assisted OOB to amb in hallway then Educate on HEP. Pt will need another session this afternoon with Family present to ensure a safe D/C to home later today.    If plan is discharge home, recommend the following: A lot of help with walking and/or transfers;A lot of help with bathing/dressing/bathroom;Assistance with cooking/housework;Help with stairs or ramp for entrance   Can travel by private vehicle        Equipment Recommendations  None recommended by PT    Recommendations for Other Services       Precautions / Restrictions Precautions Precautions: Fall Precaution/Restrictions Comments: No posterior hip precautions Restrictions Weight Bearing Restrictions Per Provider Order: No LLE Weight Bearing Per Provider Order: Weight bearing as tolerated     Mobility  Bed Mobility Overal bed mobility: Needs Assistance Bed Mobility: Supine to Sit     Supine to sit: Contact guard, Supervision     General bed mobility comments: demonstarted and instriucted how to use his strap to self assist LE OOB.  Required increased time and repeat directions due to [facility].    Transfers Overall transfer level: Needs assistance Equipment used: Rolling walker (2 wheels) Transfers: Sit to/from Stand Sit to Stand: Min assist, From elevated surface, Contact guard assist           General transfer comment: cues for hand placement; needed  bed elevated    Ambulation/Gait Ambulation/Gait assistance: Min assist, Contact guard assist Gait Distance (Feet): 34 Feet Assistive device: Rolling walker (2 wheels) Gait Pattern/deviations: Step-to pattern, Decreased stride length, Decreased weight shift to left Gait velocity: decreased     General Gait Details: tolerated an increased distance but does fatigue easily.  VC's on safety with turns and back steps to recliner.  Repeat instructions due to [facility].   Stairs             Wheelchair Mobility     Tilt Bed    Modified Rankin (Stroke Patients Only)       Balance                                            Communication Communication Factors Affecting Communication: Hearing impaired  Cognition Arousal: Alert Behavior During Therapy: WFL for tasks assessed/performed   PT - Cognitive impairments: No apparent impairments                       PT - Cognition Comments: AxO x 3 pleasant and motivated.  This is his 4th and final total joint replcement.  VERY HOH requiring repeat instructions. Following commands: Intact      Cueing Cueing Techniques: Verbal cues  Exercises  Total Hip Replacement TE's following HEP Handout 10 reps ankle pumps 05 reps knee presses 05 reps heel slides 05 reps  SAQ's 05 reps ABD Instructed how to use a belt loop to assist  Followed by ICE     General Comments        Pertinent Vitals/Pain Pain Assessment Pain Assessment: 0-10 Pain Score: 6  Pain Location: L hip with activity Pain Descriptors / Indicators: Discomfort, Guarding, Operative site guarding Pain Intervention(s): Monitored during session, Premedicated before session, Repositioned, Ice applied    Home Living                          Prior Function            PT Goals (current goals can now be found in the care plan section) Progress towards PT goals: Progressing toward goals    Frequency    7X/week      PT  Plan      Co-evaluation              AM-PAC PT 6 Clicks Mobility   Outcome Measure  Help needed turning from your back to your side while in a flat bed without using bedrails?: A Little Help needed moving from lying on your back to sitting on the side of a flat bed without using bedrails?: A Little Help needed moving to and from a bed to a chair (including a wheelchair)?: A Little Help needed standing up from a chair using your arms (e.g., wheelchair or bedside chair)?: A Little Help needed to walk in hospital room?: A Little Help needed climbing 3-5 steps with a railing? : A Lot 6 Click Score: 17    End of Session Equipment Utilized During Treatment: Gait belt Activity Tolerance: Patient limited by fatigue Patient left: with chair alarm set;in chair;with call bell/phone within reach Nurse Communication: Mobility status PT Visit Diagnosis: Other abnormalities of gait and mobility (R26.89);Muscle weakness (generalized) (M62.81)     Time: 9057-8991 PT Time Calculation (min) (ACUTE ONLY): 26 min  Charges:    $Gait Training: 8-22 mins $Therapeutic Exercise: 8-22 mins PT General Charges $$ ACUTE PT VISIT: 1 Visit                    Katheryn Leap  PTA Acute  Rehabilitation Services Office M-F          [phone]

## 2024-01-09 DIAGNOSIS — I5022 Chronic systolic (congestive) heart failure: Secondary | ICD-10-CM | POA: Diagnosis not present

## 2024-01-09 DIAGNOSIS — N189 Chronic kidney disease, unspecified: Secondary | ICD-10-CM | POA: Diagnosis not present

## 2024-01-09 DIAGNOSIS — I428 Other cardiomyopathies: Secondary | ICD-10-CM | POA: Diagnosis not present

## 2024-01-09 DIAGNOSIS — Z96642 Presence of left artificial hip joint: Secondary | ICD-10-CM | POA: Diagnosis not present

## 2024-01-09 DIAGNOSIS — I130 Hypertensive heart and chronic kidney disease with heart failure and stage 1 through stage 4 chronic kidney disease, or unspecified chronic kidney disease: Secondary | ICD-10-CM | POA: Diagnosis not present

## 2024-01-09 DIAGNOSIS — M069 Rheumatoid arthritis, unspecified: Secondary | ICD-10-CM | POA: Diagnosis not present

## 2024-01-09 DIAGNOSIS — I4819 Other persistent atrial fibrillation: Secondary | ICD-10-CM | POA: Diagnosis not present

## 2024-01-09 DIAGNOSIS — Z471 Aftercare following joint replacement surgery: Secondary | ICD-10-CM | POA: Diagnosis not present

## 2024-01-09 DIAGNOSIS — D631 Anemia in chronic kidney disease: Secondary | ICD-10-CM | POA: Diagnosis not present

## 2024-01-12 DIAGNOSIS — I428 Other cardiomyopathies: Secondary | ICD-10-CM | POA: Diagnosis not present

## 2024-01-12 DIAGNOSIS — I5022 Chronic systolic (congestive) heart failure: Secondary | ICD-10-CM | POA: Diagnosis not present

## 2024-01-12 DIAGNOSIS — I130 Hypertensive heart and chronic kidney disease with heart failure and stage 1 through stage 4 chronic kidney disease, or unspecified chronic kidney disease: Secondary | ICD-10-CM | POA: Diagnosis not present

## 2024-01-12 DIAGNOSIS — Z96642 Presence of left artificial hip joint: Secondary | ICD-10-CM | POA: Diagnosis not present

## 2024-01-12 DIAGNOSIS — Z471 Aftercare following joint replacement surgery: Secondary | ICD-10-CM | POA: Diagnosis not present

## 2024-01-12 DIAGNOSIS — M069 Rheumatoid arthritis, unspecified: Secondary | ICD-10-CM | POA: Diagnosis not present

## 2024-01-12 DIAGNOSIS — I4819 Other persistent atrial fibrillation: Secondary | ICD-10-CM | POA: Diagnosis not present

## 2024-01-12 DIAGNOSIS — D631 Anemia in chronic kidney disease: Secondary | ICD-10-CM | POA: Diagnosis not present

## 2024-01-12 DIAGNOSIS — N189 Chronic kidney disease, unspecified: Secondary | ICD-10-CM | POA: Diagnosis not present

## 2024-01-14 DIAGNOSIS — I428 Other cardiomyopathies: Secondary | ICD-10-CM | POA: Diagnosis not present

## 2024-01-14 DIAGNOSIS — I5022 Chronic systolic (congestive) heart failure: Secondary | ICD-10-CM | POA: Diagnosis not present

## 2024-01-14 DIAGNOSIS — N189 Chronic kidney disease, unspecified: Secondary | ICD-10-CM | POA: Diagnosis not present

## 2024-01-14 DIAGNOSIS — D631 Anemia in chronic kidney disease: Secondary | ICD-10-CM | POA: Diagnosis not present

## 2024-01-14 DIAGNOSIS — M069 Rheumatoid arthritis, unspecified: Secondary | ICD-10-CM | POA: Diagnosis not present

## 2024-01-14 DIAGNOSIS — Z96642 Presence of left artificial hip joint: Secondary | ICD-10-CM | POA: Diagnosis not present

## 2024-01-14 DIAGNOSIS — Z471 Aftercare following joint replacement surgery: Secondary | ICD-10-CM | POA: Diagnosis not present

## 2024-01-14 DIAGNOSIS — I130 Hypertensive heart and chronic kidney disease with heart failure and stage 1 through stage 4 chronic kidney disease, or unspecified chronic kidney disease: Secondary | ICD-10-CM | POA: Diagnosis not present

## 2024-01-14 DIAGNOSIS — I4819 Other persistent atrial fibrillation: Secondary | ICD-10-CM | POA: Diagnosis not present

## 2024-01-15 DIAGNOSIS — I4819 Other persistent atrial fibrillation: Secondary | ICD-10-CM | POA: Diagnosis not present

## 2024-01-15 DIAGNOSIS — Z471 Aftercare following joint replacement surgery: Secondary | ICD-10-CM | POA: Diagnosis not present

## 2024-01-15 DIAGNOSIS — I428 Other cardiomyopathies: Secondary | ICD-10-CM | POA: Diagnosis not present

## 2024-01-15 DIAGNOSIS — I130 Hypertensive heart and chronic kidney disease with heart failure and stage 1 through stage 4 chronic kidney disease, or unspecified chronic kidney disease: Secondary | ICD-10-CM | POA: Diagnosis not present

## 2024-01-15 DIAGNOSIS — I5022 Chronic systolic (congestive) heart failure: Secondary | ICD-10-CM | POA: Diagnosis not present

## 2024-01-15 DIAGNOSIS — D631 Anemia in chronic kidney disease: Secondary | ICD-10-CM | POA: Diagnosis not present

## 2024-01-15 DIAGNOSIS — M069 Rheumatoid arthritis, unspecified: Secondary | ICD-10-CM | POA: Diagnosis not present

## 2024-01-15 DIAGNOSIS — Z96642 Presence of left artificial hip joint: Secondary | ICD-10-CM | POA: Diagnosis not present

## 2024-01-15 DIAGNOSIS — N189 Chronic kidney disease, unspecified: Secondary | ICD-10-CM | POA: Diagnosis not present

## 2024-01-19 DIAGNOSIS — Z96642 Presence of left artificial hip joint: Secondary | ICD-10-CM | POA: Diagnosis not present

## 2024-01-19 DIAGNOSIS — I130 Hypertensive heart and chronic kidney disease with heart failure and stage 1 through stage 4 chronic kidney disease, or unspecified chronic kidney disease: Secondary | ICD-10-CM | POA: Diagnosis not present

## 2024-01-19 DIAGNOSIS — I428 Other cardiomyopathies: Secondary | ICD-10-CM | POA: Diagnosis not present

## 2024-01-19 DIAGNOSIS — D631 Anemia in chronic kidney disease: Secondary | ICD-10-CM | POA: Diagnosis not present

## 2024-01-19 DIAGNOSIS — N189 Chronic kidney disease, unspecified: Secondary | ICD-10-CM | POA: Diagnosis not present

## 2024-01-19 DIAGNOSIS — M069 Rheumatoid arthritis, unspecified: Secondary | ICD-10-CM | POA: Diagnosis not present

## 2024-01-19 DIAGNOSIS — Z471 Aftercare following joint replacement surgery: Secondary | ICD-10-CM | POA: Diagnosis not present

## 2024-01-19 DIAGNOSIS — I4819 Other persistent atrial fibrillation: Secondary | ICD-10-CM | POA: Diagnosis not present

## 2024-01-19 DIAGNOSIS — I5022 Chronic systolic (congestive) heart failure: Secondary | ICD-10-CM | POA: Diagnosis not present

## 2024-01-20 DIAGNOSIS — M1612 Unilateral primary osteoarthritis, left hip: Secondary | ICD-10-CM | POA: Diagnosis not present

## 2024-01-21 DIAGNOSIS — D631 Anemia in chronic kidney disease: Secondary | ICD-10-CM | POA: Diagnosis not present

## 2024-01-21 DIAGNOSIS — I5022 Chronic systolic (congestive) heart failure: Secondary | ICD-10-CM | POA: Diagnosis not present

## 2024-01-21 DIAGNOSIS — I4819 Other persistent atrial fibrillation: Secondary | ICD-10-CM | POA: Diagnosis not present

## 2024-01-21 DIAGNOSIS — Z96642 Presence of left artificial hip joint: Secondary | ICD-10-CM | POA: Diagnosis not present

## 2024-01-21 DIAGNOSIS — N189 Chronic kidney disease, unspecified: Secondary | ICD-10-CM | POA: Diagnosis not present

## 2024-01-21 DIAGNOSIS — M069 Rheumatoid arthritis, unspecified: Secondary | ICD-10-CM | POA: Diagnosis not present

## 2024-01-21 DIAGNOSIS — Z471 Aftercare following joint replacement surgery: Secondary | ICD-10-CM | POA: Diagnosis not present

## 2024-01-21 DIAGNOSIS — I428 Other cardiomyopathies: Secondary | ICD-10-CM | POA: Diagnosis not present

## 2024-01-21 DIAGNOSIS — I130 Hypertensive heart and chronic kidney disease with heart failure and stage 1 through stage 4 chronic kidney disease, or unspecified chronic kidney disease: Secondary | ICD-10-CM | POA: Diagnosis not present

## 2024-01-22 DIAGNOSIS — M1612 Unilateral primary osteoarthritis, left hip: Secondary | ICD-10-CM | POA: Diagnosis not present

## 2024-01-26 ENCOUNTER — Encounter: Payer: Self-pay | Admitting: Physical Therapy

## 2024-01-26 ENCOUNTER — Ambulatory Visit: Attending: Family Medicine | Admitting: Physical Therapy

## 2024-01-26 ENCOUNTER — Other Ambulatory Visit: Payer: Self-pay

## 2024-01-26 DIAGNOSIS — R252 Cramp and spasm: Secondary | ICD-10-CM | POA: Diagnosis not present

## 2024-01-26 DIAGNOSIS — M25552 Pain in left hip: Secondary | ICD-10-CM | POA: Insufficient documentation

## 2024-01-26 DIAGNOSIS — R2681 Unsteadiness on feet: Secondary | ICD-10-CM | POA: Insufficient documentation

## 2024-01-26 DIAGNOSIS — R2689 Other abnormalities of gait and mobility: Secondary | ICD-10-CM | POA: Insufficient documentation

## 2024-01-26 DIAGNOSIS — M6281 Muscle weakness (generalized): Secondary | ICD-10-CM | POA: Insufficient documentation

## 2024-01-26 DIAGNOSIS — R262 Difficulty in walking, not elsewhere classified: Secondary | ICD-10-CM | POA: Diagnosis not present

## 2024-01-26 NOTE — Therapy (Signed)
 OUTPATIENT PHYSICAL THERAPY LOWER EXTREMITY EVALUATION   Patient Name: Connor [name] MRN: [id number] DOB:Feb 28, 1934, 89 y.o., male Today's Date: 01/26/2024  END OF SESSION:  PT End of Session - 01/26/24 1058     Visit Number 1    Number of Visits 24    Date for Recertification  04/26/24    Authorization Type [employer]    PT Start Time 1055    PT Stop Time 1146    PT Time Calculation (min) 51 min    Activity Tolerance Patient limited by fatigue    Behavior During Therapy [facility] for tasks assessed/performed          Past Medical History:  Diagnosis Date   Allergy 1985   Anemia    Benign localized prostatic hyperplasia with lower urinary tract symptoms (LUTS)    urologist-- dr matilda   Cancer [facility]) 07/2018   prostate   Cataract 12/2012   CHF (congestive heart failure) (HCC) ?   Chronic anticoagulation    Xarelto  for afib   Chronic constipation    Chronic cystitis    Chronic kidney disease    Clotting disorder (HCC)    GERD (gastroesophageal reflux disease)    Gross hematuria    Heart murmur ?   History of DVT of lower extremity 06/08/2010   right femoral dvt dx post op total hip 05-08-2010   History of gastric ulcer    remote   History of kidney stones    one stone. remote hx   Hypercholesteremia    Hyperlipidemia ?   Hypertension    Dx by Dr. Salena Skill around age  76    Macular degeneration    dry in both eyes,  no injections  sees Dr. Alvia   NICM (nonischemic cardiomyopathy) [facility])    per echo 06-11-2017,  ef 45-50% with diffuse hypokinesis,  G1DD   OSA on CPAP 2017   compliant with CPAP.  managed by Dr Jude.   Osteoarthritis    Other urethral stricture, male, meatal    chronic;  pt self dilates   Paroxysmal atrial fibrillation [facility]) cardiologist-- dr kelsie   first dx 2009/   a. documented by Dr Comer on office EKG 9/12. b. maintained on Tikosyn  and Xarelto . c. s/p DCCV 's.  d.  x3  EP w/ ablation atrial fib last one 12-05-2016   Pre-diabetes     Premature ventricular contraction    Presence of Watchman left atrial appendage closure device 05/30/2022   Watchman FLX 24mm by Dr. Cindie   PVC's (premature ventricular contractions)    RA (rheumatoid arthritis) [facility])    rheumatologist--- Dr LORELEI Jacob,  treated with Humira   RBBB (right bundle branch block)    Hx of a fib, ablation Aug 2018   PVCs,   Pt had Watchman placed 05-30-2022   Rheumatoid arthritis (HCC)    Sleep apnea    Ulcer    Vitamin D  deficiency    Wears hearing aid in both ears    Past Surgical History:  Procedure Laterality Date   ABLATION OF DYSRHYTHMIC FOCUS  08/29/2015   ATRIAL FIBRILLATION ABLATION  12/05/2016   ATRIAL FIBRILLATION ABLATION N/A 12/05/2016   Procedure: Atrial Fibrillation Ablation;  Surgeon: kelsie Lynwood, MD;  Location: [facility] INVASIVE CV LAB;  Service: Cardiovascular;  Laterality: N/A;   ATRIAL FIBRILLATION ABLATION N/A 02/29/2020   Procedure: ATRIAL FIBRILLATION ABLATION;  Surgeon: kelsie Lynwood, MD;  Location: [facility] INVASIVE CV LAB;  Service: Cardiovascular;  Laterality: N/A;  ATRIAL FLUTTER ABLATION  09/2010   by MILUS EDWARDS Right 1990s   CARDIOVERSION N/A 10/28/2012   Procedure: CARDIOVERSION;  Surgeon: Lonni JONETTA Cash, MD;  Location: [facility] OR;  Service: Cardiovascular;  Laterality: N/A;   CATARACT EXTRACTION W/ INTRAOCULAR LENS  IMPLANT, BILATERAL  2019   CYSTOSCOPY WITH BIOPSY N/A 09/03/2018   Procedure: CYSTOSCOPY WITH FULGURATION CELESTA;  Surgeon: Matilda Senior, MD;  Location: [facility] ORS;  Service: Urology;  Laterality: N/A;  30 MINS   CYSTOSCOPY WITH FULGERATION N/A 01/23/2022   Procedure: CYSTOSCOPY WITH FULGERATION OF PROSTATIC URETHRA;  Surgeon: Matilda Senior, MD;  Location: [facility] ORS;  Service: Urology;  Laterality: N/A;  30 MINS   DENTAL SURGERY     ELECTROPHYSIOLOGIC STUDY N/A 08/29/2015   Procedure: Atrial Fibrillation Ablation;  Surgeon: Lynwood Rakers, MD;  Location: [facility] INVASIVE CV LAB;  Service: Cardiovascular;   Laterality: N/A;   EYE SURGERY     Cataracts   JOINT REPLACEMENT  1995, 1997   KNEE SURGERY Bilateral x3   1960s & 1970s   open   LEFT ATRIAL APPENDAGE OCCLUSION N/A 05/30/2022   Procedure: LEFT ATRIAL APPENDAGE OCCLUSION;  Surgeon: Cindie Ole DASEN, MD;  Location: [facility] INVASIVE CV LAB;  Service: Cardiovascular;  Laterality: N/A;   LEFT HEART CATHETERIZATION WITH CORONARY ANGIOGRAM N/A 06/07/2011   Procedure: LEFT HEART CATHETERIZATION WITH CORONARY ANGIOGRAM;  Surgeon: Peter M Swaziland, MD;  Location: [facility] CATH LAB;  Service: Cardiovascular;  Laterality: N/A;    Normal coronary arteries,  low normal LVF   TEE WITHOUT CARDIOVERSION N/A 05/30/2022   Procedure: TRANSESOPHAGEAL ECHOCARDIOGRAM (TEE);  Surgeon: Cindie Ole DASEN, MD;  Location: [facility] INVASIVE CV LAB;  Service: Cardiovascular;  Laterality: N/A;   TONSILLECTOMY AND ADENOIDECTOMY  age 13   TOTAL HIP ARTHROPLASTY Right 06/04/10    dr jane  @[facility]    TOTAL HIP ARTHROPLASTY Left 01/07/2024   Procedure: ARTHROPLASTY, HIP, TOTAL,POSTERIOR APPROACH;  Surgeon: Edna Toribio LABOR, MD;  Location: [facility] ORS;  Service: Orthopedics;  Laterality: Left;   TOTAL KNEE ARTHROPLASTY Bilateral right 1995;  left 1997   TRANSURETHRAL RESECTION OF PROSTATE  06-29-2002   dr humphries  @[facility]    TRANSURETHRAL RESECTION OF PROSTATE N/A 07/06/2018   Procedure: TRANSURETHRAL RESECTION OF THE PROSTATE (TURP);  Surgeon: Matilda Senior, MD;  Location: [facility];  Service: Urology;  Laterality: N/A;  45 MINS   Patient Active Problem List   Diagnosis Date Noted   Primary osteoarthritis of left hip 01/07/2024   Pain due to onychomycosis of toenails of both feet 07/03/2023   Atrial fibrillation (HCC) 05/30/2022   Presence of Watchman left atrial appendage closure device 05/30/2022   Hematuria 01/04/2022   Gross hematuria 01/04/2022   Secondary hypercoagulable state (HCC) 03/28/2020   Malignant neoplasm of prostate (HCC) 09/11/2018   Enlarged prostate  with urinary obstruction 07/06/2018   Gastrocnemius strain, right, initial encounter 12/10/2017   Baker's cyst of knee, right 12/10/2017   Obesity 03/24/2017   Paroxysmal atrial fibrillation (HCC) 12/05/2016   Near syncope 04/20/2016   Leg hematoma, left, initial encounter 04/20/2016   Bleeding    Ecchymosis    Left hamstring muscle strain    Muscle tear    RBBB 08/30/2015   Chronic systolic dysfunction of left ventricle 07/11/2014   OSA (obstructive sleep apnea) 04/07/2013   Multinodular goiter 01/20/2013   Cervical spondylosis without myelopathy 01/18/2013   Left ventricular dysfunction 12/17/2011   Low back pain 09/11/2011   Fatigue 05/22/2011   PVC (premature  ventricular contraction) 01/18/2011   Persistent atrial fibrillation (HCC) 01/12/2011   Hyperlipidemia 08/02/2010   Hypertension 08/02/2010   Osteoarthritis 08/02/2010   BPH (benign prostatic hyperplasia) 08/02/2010   GERD (gastroesophageal reflux disease) 08/02/2010   h/o Atrial flutter (HCC) s/p ablation 2012     PCP: Copland, MD  REFERRING PROVIDER: Edna, MD  REFERRING DIAG: S/P Left THA posterior  THERAPY DIAG:  Pain in left hip  Muscle weakness (generalized)  Unsteadiness on feet  Difficulty in walking, not elsewhere classified  Other abnormalities of gait and mobility  Rationale for Evaluation and Treatment: Rehabilitation  ONSET DATE: 01/07/24  SUBJECTIVE:   SUBJECTIVE STATEMENT: Patient underwent left posterior THA on 01/07/24, he has had Home PT until last week.  He reports that he has very little pain, just fatigue.  He reports that he has been doing the HEP given from the hospital.    PERTINENT HISTORY: A fib, prostate CA, LBP. HLD, HTN, OA, RA, bilateral TKA PAIN:  Are you having pain? Yes: NPRS scale: 0/10 Pain location: left hip/back/thigh Pain description: spasm Aggravating factors: getting up from the chair pain up to 5/10 Relieving factors: rest, Tylenol , can have no  pain  PRECAUTIONS: Posterior hip  RED FLAGS: None   WEIGHT BEARING RESTRICTIONS: No  FALLS:  Has patient fallen in last 6 months? No  LIVING ENVIRONMENT: Lives with: lives with their family and lives with their spouse Lives in: House/apartment Stairs: Yes: Internal: 14 steps; can reach both Has following equipment at home: Vannie - 2 wheeled  OCCUPATION: retired  PLOF: Independent and shopping, no device, some yard work  PATIENT GOALS: I want to walk without any device, I want to be able to do yard work  NEXT MD VISIT: October 16  OBJECTIVE:  Note: Objective measures were completed at Evaluation unless otherwise noted.  DIAGNOSTIC FINDINGS: N/A   COGNITION: Overall cognitive status: Within functional limits for tasks assessed     SENSATION: WFL  EDEMA:  Has visible edema in the left thigh and knee  MUSCLE LENGTH: Tight calves and HS  POSTURE: rounded shoulders, forward head, and decreased lumbar lordosis  PALPATION: Mild tenderness in the left thigh and hip, buttock  LOWER EXTREMITY ROM:  Active ROM Right eval Left eval  Hip flexion  45  Hip extension    Hip abduction  10  Hip adduction    Hip internal rotation    Hip external rotation    Knee flexion    Knee extension    Ankle dorsiflexion    Ankle plantarflexion    Ankle inversion    Ankle eversion     (Blank rows = not tested)  LOWER EXTREMITY MMT:  MMT Right eval Left eval  Hip flexion  3+  Hip extension    Hip abduction  3+  Hip adduction    Hip internal rotation    Hip external rotation    Knee flexion  4-  Knee extension  4-  Ankle dorsiflexion    Ankle plantarflexion    Ankle inversion    Ankle eversion     (Blank rows = not tested)  FUNCTIONAL TESTS:  5 times sit to stand: unable to do without hands, had to use the arm rests and could only complete 3 due to pain and fatigue took 32 seconds to do 3 of the 5 Timed up and go (TUG): 32 seconds with FWW 3 minute walk test:  200 feet with the FWW  GAIT: Distance walked: 200 feet Assistive  device utilized: Environmental consultant - 2 wheeled Level of assistance: SBA Comments: slow, left toe slightly turns in                                                                                                                                TREATMENT DATE:  01/26/24 Evaluation    PATIENT EDUCATION:  Education details: POC/Reviewed HEP Person educated: Patient Education method: Explanation Education comprehension: verbalized understanding  HOME EXERCISE PROGRAM: From hospital   ASSESSMENT:  CLINICAL IMPRESSION: Patient is a 89 y.o. male who was seen today for physical therapy evaluation and treatment for s/p ;eft THA posterior approach.   He was having significant pain prior to the surgery so he had been limited with his ability.  He reports that for the most part he is without pain and only takes Tylenol .  He really struggles to get up from sitting, could not complete the 5XSTS test due to difficulty and pain.    OBJECTIVE IMPAIRMENTS: Abnormal gait, cardiopulmonary status limiting activity, decreased activity tolerance, decreased balance, decreased endurance, decreased mobility, difficulty walking, decreased ROM, decreased strength, increased edema, increased muscle spasms, impaired flexibility, postural dysfunction, and pain.   ACTIVITY LIMITATIONS: carrying, lifting, sitting, standing, squatting, sleeping, stairs, transfers, bed mobility, bathing, and locomotion level  PARTICIPATION LIMITATIONS: cleaning, laundry, driving, shopping, community activity, and yard work  PERSONAL FACTORS: Age, Fitness, Past/current experiences, 1 comorbidity:  , and 3+ comorbidities: as above are also affecting patient's functional outcome.   REHAB POTENTIAL: Good  CLINICAL DECISION MAKING: Evolving/moderate complexity  EVALUATION COMPLEXITY: Moderate   GOALS: Goals reviewed with patient? Yes  SHORT TERM GOALS: Target date:  02/25/24 Independent with initial HEP Baseline: Goal status: INITIAL   LONG TERM GOALS: Target date: 04/26/24  Independent with advanced HEP Baseline:  Goal status: INITIAL  2.  Go up and down stairs step over step Baseline:  Goal status: INITIAL  3.  Get up from sitting without hands Baseline:  Goal status: INITIAL  4.  Decrease TUG time to 15 seconds Baseline: 32 seconds Goal status: INITIAL  5.  Walk without device or SPC for all distances Baseline:  Goal status: INITIAL  6.  Walk 400 feet for Baseline:  Goal status: INITIAL   PLAN:  PT FREQUENCY: 2x/week  PT DURATION: 12 weeks  PLANNED INTERVENTIONS: 97164- PT Re-evaluation, 97110-Therapeutic exercises, 97530- Therapeutic activity, 97112- Neuromuscular re-education, 97535- Self Care, [id number]- Manual therapy, [phone]- Gait training, Patient/Family education, Balance training, Stair training, and Cryotherapy  PLAN FOR NEXT SESSION: advance walking, endurance, strength and function   Yeriel Mineo W, PT 01/26/2024, 11:00 AM

## 2024-01-28 ENCOUNTER — Encounter: Payer: Self-pay | Admitting: Physical Therapy

## 2024-01-28 ENCOUNTER — Ambulatory Visit: Admitting: Physical Therapy

## 2024-01-28 DIAGNOSIS — R2689 Other abnormalities of gait and mobility: Secondary | ICD-10-CM

## 2024-01-28 DIAGNOSIS — R252 Cramp and spasm: Secondary | ICD-10-CM

## 2024-01-28 DIAGNOSIS — R262 Difficulty in walking, not elsewhere classified: Secondary | ICD-10-CM

## 2024-01-28 DIAGNOSIS — M25552 Pain in left hip: Secondary | ICD-10-CM | POA: Diagnosis not present

## 2024-01-28 DIAGNOSIS — M6281 Muscle weakness (generalized): Secondary | ICD-10-CM | POA: Diagnosis not present

## 2024-01-28 DIAGNOSIS — R2681 Unsteadiness on feet: Secondary | ICD-10-CM

## 2024-01-28 NOTE — Therapy (Signed)
 OUTPATIENT PHYSICAL THERAPY LOWER EXTREMITY TREATMENT   Patient Name: Connor [name] MRN: [id number] DOB:1933-10-10, 89 y.o., male Today's Date: 01/28/2024  END OF SESSION:  PT End of Session - 01/28/24 1057     Visit Number 2    Number of Visits 24    Date for Recertification  04/26/24    Authorization Type [employer]    PT Start Time 1055    PT Stop Time 1142    PT Time Calculation (min) 47 min    Activity Tolerance Patient limited by fatigue    Behavior During Therapy [facility] for tasks assessed/performed          Past Medical History:  Diagnosis Date   Allergy 1985   Anemia    Benign localized prostatic hyperplasia with lower urinary tract symptoms (LUTS)    urologist-- dr matilda   Cancer [facility]) 07/2018   prostate   Cataract 12/2012   CHF (congestive heart failure) (HCC) ?   Chronic anticoagulation    Xarelto  for afib   Chronic constipation    Chronic cystitis    Chronic kidney disease    Clotting disorder    GERD (gastroesophageal reflux disease)    Gross hematuria    Heart murmur ?   History of DVT of lower extremity 06/08/2010   right femoral dvt dx post op total hip 05-08-2010   History of gastric ulcer    remote   History of kidney stones    one stone. remote hx   Hypercholesteremia    Hyperlipidemia ?   Hypertension    Dx by Dr. Salena Skill around age  73    Macular degeneration    dry in both eyes,  no injections  sees Dr. Alvia   NICM (nonischemic cardiomyopathy) [facility])    per echo 06-11-2017,  ef 45-50% with diffuse hypokinesis,  G1DD   OSA on CPAP 2017   compliant with CPAP.  managed by Dr Jude.   Osteoarthritis    Other urethral stricture, male, meatal    chronic;  pt self dilates   Paroxysmal atrial fibrillation [facility]) cardiologist-- dr kelsie   first dx 2009/   a. documented by Dr Comer on office EKG 9/12. b. maintained on Tikosyn  and Xarelto . c. s/p DCCV 's.  d.  x3  EP w/ ablation atrial fib last one 12-05-2016   Pre-diabetes     Premature ventricular contraction    Presence of Watchman left atrial appendage closure device 05/30/2022   Watchman FLX 24mm by Dr. Cindie   PVC's (premature ventricular contractions)    RA (rheumatoid arthritis) [facility])    rheumatologist--- Dr LORELEI Jacob,  treated with Humira   RBBB (right bundle branch block)    Hx of a fib, ablation Aug 2018   PVCs,   Pt had Watchman placed 05-30-2022   Rheumatoid arthritis (HCC)    Sleep apnea    Ulcer    Vitamin D  deficiency    Wears hearing aid in both ears    Past Surgical History:  Procedure Laterality Date   ABLATION OF DYSRHYTHMIC FOCUS  08/29/2015   ATRIAL FIBRILLATION ABLATION  12/05/2016   ATRIAL FIBRILLATION ABLATION N/A 12/05/2016   Procedure: Atrial Fibrillation Ablation;  Surgeon: kelsie Lynwood, MD;  Location: [facility] INVASIVE CV LAB;  Service: Cardiovascular;  Laterality: N/A;   ATRIAL FIBRILLATION ABLATION N/A 02/29/2020   Procedure: ATRIAL FIBRILLATION ABLATION;  Surgeon: kelsie Lynwood, MD;  Location: [facility] INVASIVE CV LAB;  Service: Cardiovascular;  Laterality: N/A;   ATRIAL  FLUTTER ABLATION  09/2010   by MILUS EDWARDS Right 1990s   CARDIOVERSION N/A 10/28/2012   Procedure: CARDIOVERSION;  Surgeon: Lonni JONETTA Cash, MD;  Location: [facility] OR;  Service: Cardiovascular;  Laterality: N/A;   CATARACT EXTRACTION W/ INTRAOCULAR LENS  IMPLANT, BILATERAL  2019   CYSTOSCOPY WITH BIOPSY N/A 09/03/2018   Procedure: CYSTOSCOPY WITH FULGURATION CELESTA;  Surgeon: Matilda Senior, MD;  Location: [facility] ORS;  Service: Urology;  Laterality: N/A;  30 MINS   CYSTOSCOPY WITH FULGERATION N/A 01/23/2022   Procedure: CYSTOSCOPY WITH FULGERATION OF PROSTATIC URETHRA;  Surgeon: Matilda Senior, MD;  Location: [facility] ORS;  Service: Urology;  Laterality: N/A;  30 MINS   DENTAL SURGERY     ELECTROPHYSIOLOGIC STUDY N/A 08/29/2015   Procedure: Atrial Fibrillation Ablation;  Surgeon: Lynwood Rakers, MD;  Location: [facility] INVASIVE CV LAB;  Service: Cardiovascular;   Laterality: N/A;   EYE SURGERY     Cataracts   JOINT REPLACEMENT  1995, 1997   KNEE SURGERY Bilateral x3   1960s & 1970s   open   LEFT ATRIAL APPENDAGE OCCLUSION N/A 05/30/2022   Procedure: LEFT ATRIAL APPENDAGE OCCLUSION;  Surgeon: Cindie Ole DASEN, MD;  Location: [facility] INVASIVE CV LAB;  Service: Cardiovascular;  Laterality: N/A;   LEFT HEART CATHETERIZATION WITH CORONARY ANGIOGRAM N/A 06/07/2011   Procedure: LEFT HEART CATHETERIZATION WITH CORONARY ANGIOGRAM;  Surgeon: Peter M Swaziland, MD;  Location: [facility] CATH LAB;  Service: Cardiovascular;  Laterality: N/A;    Normal coronary arteries,  low normal LVF   TEE WITHOUT CARDIOVERSION N/A 05/30/2022   Procedure: TRANSESOPHAGEAL ECHOCARDIOGRAM (TEE);  Surgeon: Cindie Ole DASEN, MD;  Location: [facility] INVASIVE CV LAB;  Service: Cardiovascular;  Laterality: N/A;   TONSILLECTOMY AND ADENOIDECTOMY  age 39   TOTAL HIP ARTHROPLASTY Right 06/04/10    dr jane  @[facility]    TOTAL HIP ARTHROPLASTY Left 01/07/2024   Procedure: ARTHROPLASTY, HIP, TOTAL,POSTERIOR APPROACH;  Surgeon: Edna Toribio LABOR, MD;  Location: [facility] ORS;  Service: Orthopedics;  Laterality: Left;   TOTAL KNEE ARTHROPLASTY Bilateral right 1995;  left 1997   TRANSURETHRAL RESECTION OF PROSTATE  06-29-2002   dr humphries  @[facility]    TRANSURETHRAL RESECTION OF PROSTATE N/A 07/06/2018   Procedure: TRANSURETHRAL RESECTION OF THE PROSTATE (TURP);  Surgeon: Matilda Senior, MD;  Location: [facility];  Service: Urology;  Laterality: N/A;  45 MINS   Patient Active Problem List   Diagnosis Date Noted   Primary osteoarthritis of left hip 01/07/2024   Pain due to onychomycosis of toenails of both feet 07/03/2023   Atrial fibrillation (HCC) 05/30/2022   Presence of Watchman left atrial appendage closure device 05/30/2022   Hematuria 01/04/2022   Gross hematuria 01/04/2022   Secondary hypercoagulable state 03/28/2020   Malignant neoplasm of prostate (HCC) 09/11/2018   Enlarged prostate with  urinary obstruction 07/06/2018   Gastrocnemius strain, right, initial encounter 12/10/2017   Baker's cyst of knee, right 12/10/2017   Obesity 03/24/2017   Paroxysmal atrial fibrillation (HCC) 12/05/2016   Near syncope 04/20/2016   Leg hematoma, left, initial encounter 04/20/2016   Bleeding    Ecchymosis    Left hamstring muscle strain    Muscle tear    RBBB 08/30/2015   Chronic systolic dysfunction of left ventricle 07/11/2014   OSA (obstructive sleep apnea) 04/07/2013   Multinodular goiter 01/20/2013   Cervical spondylosis without myelopathy 01/18/2013   Left ventricular dysfunction 12/17/2011   Low back pain 09/11/2011   Fatigue 05/22/2011   PVC (premature ventricular contraction)  01/18/2011   Persistent atrial fibrillation (HCC) 01/12/2011   Hyperlipidemia 08/02/2010   Hypertension 08/02/2010   Osteoarthritis 08/02/2010   BPH (benign prostatic hyperplasia) 08/02/2010   GERD (gastroesophageal reflux disease) 08/02/2010   h/o Atrial flutter (HCC) s/p ablation 2012     PCP: Copland, MD  REFERRING PROVIDER: Edna, MD  REFERRING DIAG: S/P Left THA posterior  THERAPY DIAG:  Pain in left hip  Muscle weakness (generalized)  Unsteadiness on feet  Difficulty in walking, not elsewhere classified  Other abnormalities of gait and mobility  Cramp and spasm  Rationale for Evaluation and Treatment: Rehabilitation  ONSET DATE: 01/07/24  SUBJECTIVE:   SUBJECTIVE STATEMENT: Reports that he is worn out, just lacks energy, reports some hip pain last night   Patient underwent left posterior THA on 01/07/24, he has had Home PT until last week.  He reports that he has very little pain, just fatigue.  He reports that he has been doing the HEP given from the hospital.    PERTINENT HISTORY: A fib, prostate CA, LBP. HLD, HTN, OA, RA, bilateral TKA PAIN:  Are you having pain? Yes: NPRS scale: 0/10 Pain location: left hip/back/thigh Pain description: spasm Aggravating factors:  getting up from the chair pain up to 5/10 Relieving factors: rest, Tylenol , can have no pain  PRECAUTIONS: Posterior hip  RED FLAGS: None   WEIGHT BEARING RESTRICTIONS: No  FALLS:  Has patient fallen in last 6 months? No  LIVING ENVIRONMENT: Lives with: lives with their family and lives with their spouse Lives in: House/apartment Stairs: Yes: Internal: 14 steps; can reach both Has following equipment at home: Vannie - 2 wheeled  OCCUPATION: retired  PLOF: Independent and shopping, no device, some yard work  PATIENT GOALS: I want to walk without any device, I want to be able to do yard work  NEXT MD VISIT: October 16  OBJECTIVE:  Note: Objective measures were completed at Evaluation unless otherwise noted.  DIAGNOSTIC FINDINGS: N/A   COGNITION: Overall cognitive status: Within functional limits for tasks assessed     SENSATION: WFL  EDEMA:  Has visible edema in the left thigh and knee  MUSCLE LENGTH: Tight calves and HS  POSTURE: rounded shoulders, forward head, and decreased lumbar lordosis  PALPATION: Mild tenderness in the left thigh and hip, buttock  LOWER EXTREMITY ROM:  Active ROM Right eval Left eval  Hip flexion  45  Hip extension    Hip abduction  10  Hip adduction    Hip internal rotation    Hip external rotation    Knee flexion    Knee extension    Ankle dorsiflexion    Ankle plantarflexion    Ankle inversion    Ankle eversion     (Blank rows = not tested)  LOWER EXTREMITY MMT:  MMT Right eval Left eval  Hip flexion  3+  Hip extension    Hip abduction  3+  Hip adduction    Hip internal rotation    Hip external rotation    Knee flexion  4-  Knee extension  4-  Ankle dorsiflexion    Ankle plantarflexion    Ankle inversion    Ankle eversion     (Blank rows = not tested)  FUNCTIONAL TESTS:  5 times sit to stand: unable to do without hands, had to use the arm rests and could only complete 3 due to pain and fatigue took 32  seconds to do 3 of the 5 Timed up and go (TUG): 32  seconds with FWW 3 minute walk test: 200 feet with the FWW  GAIT: Distance walked: 200 feet Assistive device utilized: Walker - 2 wheeled Level of assistance: SBA Comments: slow, left toe slightly turns in                                                                                                                                TREATMENT DATE:  01/28/24 Nustep level 5 x 6 minutes 25# HS curls 3 x10 Standing marches 2.5# LAQ 2x10 Blue tband clamshells Ball b/n knees squeeze Tried active hip ER, very poor strength, I supported leg and had him bring left foot to his right shin, but really could not perform the motion Supine bridges that I had him add to his HEP Supine heel slide with use of sliding board  01/26/24 Evaluation    PATIENT EDUCATION:  Education details: POC/Reviewed HEP Person educated: Patient Education method: Explanation Education comprehension: verbalized understanding  HOME EXERCISE PROGRAM: From hospital   ASSESSMENT:  CLINICAL IMPRESSION: Patient reports that he has had some ache last night.  He reports that he just feels very fatigued,  needed some assist and help with some motions.  The left hip is very weak.  He could not get up from a chair that did not have arm rests, needed assit to stand up from sitting  Patient is a 89 y.o. male who was seen today for physical therapy evaluation and treatment for s/p ;eft THA posterior approach.   He was having significant pain prior to the surgery so he had been limited with his ability.  He reports that for the most part he is without pain and only takes Tylenol .  He really struggles to get up from sitting, could not complete the 5XSTS test due to difficulty and pain.    OBJECTIVE IMPAIRMENTS: Abnormal gait, cardiopulmonary status limiting activity, decreased activity tolerance, decreased balance, decreased endurance, decreased mobility, difficulty walking,  decreased ROM, decreased strength, increased edema, increased muscle spasms, impaired flexibility, postural dysfunction, and pain.   ACTIVITY LIMITATIONS: carrying, lifting, sitting, standing, squatting, sleeping, stairs, transfers, bed mobility, bathing, and locomotion level  PARTICIPATION LIMITATIONS: cleaning, laundry, driving, shopping, community activity, and yard work  PERSONAL FACTORS: Age, Fitness, Past/current experiences, 1 comorbidity:  , and 3+ comorbidities: as above are also affecting patient's functional outcome.   REHAB POTENTIAL: Good  CLINICAL DECISION MAKING: Evolving/moderate complexity  EVALUATION COMPLEXITY: Moderate   GOALS: Goals reviewed with patient? Yes  SHORT TERM GOALS: Target date: 02/25/24 Independent with initial HEP Baseline: Goal status: INITIAL   LONG TERM GOALS: Target date: 04/26/24  Independent with advanced HEP Baseline:  Goal status: INITIAL  2.  Go up and down stairs step over step Baseline:  Goal status: INITIAL  3.  Get up from sitting without hands Baseline:  Goal status: INITIAL  4.  Decrease TUG time to 15 seconds Baseline: 32 seconds Goal status: INITIAL  5.  Walk  without device or SPC for all distances Baseline:  Goal status: INITIAL  6.  Walk 400 feet for Baseline:  Goal status: INITIAL   PLAN:  PT FREQUENCY: 2x/week  PT DURATION: 12 weeks  PLANNED INTERVENTIONS: 97164- PT Re-evaluation, 97110-Therapeutic exercises, 97530- Therapeutic activity, 97112- Neuromuscular re-education, 97535- Self Care, [id number]- Manual therapy, [phone]- Gait training, Patient/Family education, Balance training, Stair training, and Cryotherapy  PLAN FOR NEXT SESSION: advance walking, endurance, strength and function   Shaylene Paganelli W, PT 01/28/2024, 10:57 AM

## 2024-02-02 ENCOUNTER — Encounter: Payer: Self-pay | Admitting: Physical Therapy

## 2024-02-02 ENCOUNTER — Ambulatory Visit: Admitting: Physical Therapy

## 2024-02-02 DIAGNOSIS — R2681 Unsteadiness on feet: Secondary | ICD-10-CM | POA: Diagnosis not present

## 2024-02-02 DIAGNOSIS — R2689 Other abnormalities of gait and mobility: Secondary | ICD-10-CM

## 2024-02-02 DIAGNOSIS — R262 Difficulty in walking, not elsewhere classified: Secondary | ICD-10-CM

## 2024-02-02 DIAGNOSIS — R252 Cramp and spasm: Secondary | ICD-10-CM

## 2024-02-02 DIAGNOSIS — M25552 Pain in left hip: Secondary | ICD-10-CM

## 2024-02-02 DIAGNOSIS — M6281 Muscle weakness (generalized): Secondary | ICD-10-CM | POA: Diagnosis not present

## 2024-02-02 NOTE — Therapy (Signed)
 OUTPATIENT PHYSICAL THERAPY LOWER EXTREMITY TREATMENT   Patient Name: Connor [name] MRN: [id number] DOB:[birth year], 89 y.o., male Today's Date: 02/02/2024  END OF SESSION:  PT End of Session - 02/02/24 1057     Visit Number 3    Number of Visits 24    Date for Recertification  04/26/24    Authorization Type [employer]    PT Start Time 1051    PT Stop Time 1140    PT Time Calculation (min) 49 min    Activity Tolerance Patient limited by fatigue    Behavior During Therapy [facility] for tasks assessed/performed          Past Medical History:  Diagnosis Date   Allergy 1985   Anemia    Benign localized prostatic hyperplasia with lower urinary tract symptoms (LUTS)    urologist-- dr matilda   Cancer [facility]) 07/2018   prostate   Cataract 12/2012   CHF (congestive heart failure) (HCC) ?   Chronic anticoagulation    Xarelto  for afib   Chronic constipation    Chronic cystitis    Chronic kidney disease    Clotting disorder    GERD (gastroesophageal reflux disease)    Gross hematuria    Heart murmur ?   History of DVT of lower extremity 06/08/2010   right femoral dvt dx post op total hip 05-08-2010   History of gastric ulcer    remote   History of kidney stones    one stone. remote hx   Hypercholesteremia    Hyperlipidemia ?   Hypertension    Dx by Dr. Salena Skill around age  12    Macular degeneration    dry in both eyes,  no injections  sees Dr. Alvia   NICM (nonischemic cardiomyopathy) [facility])    per echo 06-11-2017,  ef 45-50% with diffuse hypokinesis,  G1DD   OSA on CPAP 2017   compliant with CPAP.  managed by Dr Jude.   Osteoarthritis    Other urethral stricture, male, meatal    chronic;  pt self dilates   Paroxysmal atrial fibrillation [facility]) cardiologist-- dr kelsie   first dx 2009/   a. documented by Dr Comer on office EKG 9/12. b. maintained on Tikosyn  and Xarelto . c. s/p DCCV 's.  d.  x3  EP w/ ablation atrial fib last one 12-05-2016   Pre-diabetes     Premature ventricular contraction    Presence of Watchman left atrial appendage closure device 05/30/2022   Watchman FLX 24mm by Dr. Cindie   PVC's (premature ventricular contractions)    RA (rheumatoid arthritis) [facility])    rheumatologist--- Dr LORELEI Jacob,  treated with Humira   RBBB (right bundle branch block)    Hx of a fib, ablation Aug 2018   PVCs,   Pt had Watchman placed 05-30-2022   Rheumatoid arthritis (HCC)    Sleep apnea    Ulcer    Vitamin D  deficiency    Wears hearing aid in both ears    Past Surgical History:  Procedure Laterality Date   ABLATION OF DYSRHYTHMIC FOCUS  08/29/2015   ATRIAL FIBRILLATION ABLATION  12/05/2016   ATRIAL FIBRILLATION ABLATION N/A 12/05/2016   Procedure: Atrial Fibrillation Ablation;  Surgeon: kelsie Lynwood, MD;  Location: [facility] INVASIVE CV LAB;  Service: Cardiovascular;  Laterality: N/A;   ATRIAL FIBRILLATION ABLATION N/A 02/29/2020   Procedure: ATRIAL FIBRILLATION ABLATION;  Surgeon: kelsie Lynwood, MD;  Location: [facility] INVASIVE CV LAB;  Service: Cardiovascular;  Laterality: N/A;   ATRIAL  FLUTTER ABLATION  09/2010   by MILUS EDWARDS Right 1990s   CARDIOVERSION N/A 10/28/2012   Procedure: CARDIOVERSION;  Surgeon: Lonni JONETTA Cash, MD;  Location: [facility] OR;  Service: Cardiovascular;  Laterality: N/A;   CATARACT EXTRACTION W/ INTRAOCULAR LENS  IMPLANT, BILATERAL  2019   CYSTOSCOPY WITH BIOPSY N/A 09/03/2018   Procedure: CYSTOSCOPY WITH FULGURATION CELESTA;  Surgeon: Matilda Senior, MD;  Location: [facility] ORS;  Service: Urology;  Laterality: N/A;  30 MINS   CYSTOSCOPY WITH FULGERATION N/A 01/23/2022   Procedure: CYSTOSCOPY WITH FULGERATION OF PROSTATIC URETHRA;  Surgeon: Matilda Senior, MD;  Location: [facility] ORS;  Service: Urology;  Laterality: N/A;  30 MINS   DENTAL SURGERY     ELECTROPHYSIOLOGIC STUDY N/A 08/29/2015   Procedure: Atrial Fibrillation Ablation;  Surgeon: Lynwood Rakers, MD;  Location: [facility] INVASIVE CV LAB;  Service: Cardiovascular;   Laterality: N/A;   EYE SURGERY     Cataracts   JOINT REPLACEMENT  1995, 1997   KNEE SURGERY Bilateral x3   1960s & 1970s   open   LEFT ATRIAL APPENDAGE OCCLUSION N/A 05/30/2022   Procedure: LEFT ATRIAL APPENDAGE OCCLUSION;  Surgeon: Cindie Ole DASEN, MD;  Location: [facility] INVASIVE CV LAB;  Service: Cardiovascular;  Laterality: N/A;   LEFT HEART CATHETERIZATION WITH CORONARY ANGIOGRAM N/A 06/07/2011   Procedure: LEFT HEART CATHETERIZATION WITH CORONARY ANGIOGRAM;  Surgeon: Peter M Swaziland, MD;  Location: [facility] CATH LAB;  Service: Cardiovascular;  Laterality: N/A;    Normal coronary arteries,  low normal LVF   TEE WITHOUT CARDIOVERSION N/A 05/30/2022   Procedure: TRANSESOPHAGEAL ECHOCARDIOGRAM (TEE);  Surgeon: Cindie Ole DASEN, MD;  Location: [facility] INVASIVE CV LAB;  Service: Cardiovascular;  Laterality: N/A;   TONSILLECTOMY AND ADENOIDECTOMY  age 4   TOTAL HIP ARTHROPLASTY Right 06/04/10    dr jane  @[facility]    TOTAL HIP ARTHROPLASTY Left 01/07/2024   Procedure: ARTHROPLASTY, HIP, TOTAL,POSTERIOR APPROACH;  Surgeon: Edna Toribio LABOR, MD;  Location: [facility] ORS;  Service: Orthopedics;  Laterality: Left;   TOTAL KNEE ARTHROPLASTY Bilateral right 1995;  left 1997   TRANSURETHRAL RESECTION OF PROSTATE  06-29-2002   dr humphries  @[facility]    TRANSURETHRAL RESECTION OF PROSTATE N/A 07/06/2018   Procedure: TRANSURETHRAL RESECTION OF THE PROSTATE (TURP);  Surgeon: Matilda Senior, MD;  Location: [facility];  Service: Urology;  Laterality: N/A;  45 MINS   Patient Active Problem List   Diagnosis Date Noted   Primary osteoarthritis of left hip 01/07/2024   Pain due to onychomycosis of toenails of both feet 07/03/2023   Atrial fibrillation (HCC) 05/30/2022   Presence of Watchman left atrial appendage closure device 05/30/2022   Hematuria 01/04/2022   Gross hematuria 01/04/2022   Secondary hypercoagulable state 03/28/2020   Malignant neoplasm of prostate (HCC) 09/11/2018   Enlarged prostate with  urinary obstruction 07/06/2018   Gastrocnemius strain, right, initial encounter 12/10/2017   Baker's cyst of knee, right 12/10/2017   Obesity 03/24/2017   Paroxysmal atrial fibrillation (HCC) 12/05/2016   Near syncope 04/20/2016   Leg hematoma, left, initial encounter 04/20/2016   Bleeding    Ecchymosis    Left hamstring muscle strain    Muscle tear    RBBB 08/30/2015   Chronic systolic dysfunction of left ventricle 07/11/2014   OSA (obstructive sleep apnea) 04/07/2013   Multinodular goiter 01/20/2013   Cervical spondylosis without myelopathy 01/18/2013   Left ventricular dysfunction 12/17/2011   Low back pain 09/11/2011   Fatigue 05/22/2011   PVC (premature ventricular contraction)  01/18/2011   Persistent atrial fibrillation (HCC) 01/12/2011   Hyperlipidemia 08/02/2010   Hypertension 08/02/2010   Osteoarthritis 08/02/2010   BPH (benign prostatic hyperplasia) 08/02/2010   GERD (gastroesophageal reflux disease) 08/02/2010   h/o Atrial flutter (HCC) s/p ablation 2012     PCP: Copland, MD  REFERRING PROVIDER: Edna, MD  REFERRING DIAG: S/P Left THA posterior  THERAPY DIAG:  Pain in left hip  Muscle weakness (generalized)  Unsteadiness on feet  Difficulty in walking, not elsewhere classified  Other abnormalities of gait and mobility  Cramp and spasm  Rationale for Evaluation and Treatment: Rehabilitation  ONSET DATE: 01/07/24  SUBJECTIVE:   SUBJECTIVE STATEMENT: Patient reports that yesterday he starting using the [facility] mostly, still uses the walker at night, reports that his wife had a fall and he had to go to the grocery store for her, and he feels that it was a little much, reports that the left hip and quad are very sore.  Has a significant limp in the left with the [facility]   Patient underwent left posterior THA on 01/07/24, he has had Home PT until last week.  He reports that he has very little pain, just fatigue.  He reports that he has been doing the HEP given  from the hospital.    PERTINENT HISTORY: A fib, prostate CA, LBP. HLD, HTN, OA, RA, bilateral TKA PAIN:  Are you having pain? Yes: NPRS scale: 0/10 Pain location: left hip/back/thigh Pain description: spasm Aggravating factors: getting up from the chair pain up to 5/10 Relieving factors: rest, Tylenol , can have no pain  PRECAUTIONS: Posterior hip  RED FLAGS: None   WEIGHT BEARING RESTRICTIONS: No  FALLS:  Has patient fallen in last 6 months? No  LIVING ENVIRONMENT: Lives with: lives with their family and lives with their spouse Lives in: House/apartment Stairs: Yes: Internal: 14 steps; can reach both Has following equipment at home: Vannie - 2 wheeled  OCCUPATION: retired  PLOF: Independent and shopping, no device, some yard work  PATIENT GOALS: I want to walk without any device, I want to be able to do yard work  NEXT MD VISIT: October 16  OBJECTIVE:  Note: Objective measures were completed at Evaluation unless otherwise noted.  DIAGNOSTIC FINDINGS: N/A   COGNITION: Overall cognitive status: Within functional limits for tasks assessed     SENSATION: WFL  EDEMA:  Has visible edema in the left thigh and knee  MUSCLE LENGTH: Tight calves and HS  POSTURE: rounded shoulders, forward head, and decreased lumbar lordosis  PALPATION: Mild tenderness in the left thigh and hip, buttock  LOWER EXTREMITY ROM:  Active ROM Right eval Left eval  Hip flexion  45  Hip extension    Hip abduction  10  Hip adduction    Hip internal rotation    Hip external rotation    Knee flexion    Knee extension    Ankle dorsiflexion    Ankle plantarflexion    Ankle inversion    Ankle eversion     (Blank rows = not tested)  LOWER EXTREMITY MMT:  MMT Right eval Left eval  Hip flexion  3+  Hip extension    Hip abduction  3+  Hip adduction    Hip internal rotation    Hip external rotation    Knee flexion  4-  Knee extension  4-  Ankle dorsiflexion    Ankle  plantarflexion    Ankle inversion    Ankle eversion     (Blank  rows = not tested)  FUNCTIONAL TESTS:  5 times sit to stand: unable to do without hands, had to use the arm rests and could only complete 3 due to pain and fatigue took 32 seconds to do 3 of the 5 Timed up and go (TUG): 32 seconds with FWW 3 minute walk test: 200 feet with the FWW  GAIT: Distance walked: 200 feet Assistive device utilized: Walker - 2 wheeled Level of assistance: SBA Comments: slow, left toe slightly turns in                                                                                                                                TREATMENT DATE:  02/02/24 Nustep level 5 x 7 minutes 25# HS curls 3x10 5# Leg ext 2x10 Slant board stretch On airex ball toss Ball b/n knees squeeze Standing with FWW marches and abduction Feet on ball K2C, rotation, small bridge and isometric abs 2x10 for each Blue tband clamshells Sliding board with cloth 2x10 SLR 2x5 Right sidelying clamshells STS from elevated seating surface 2 x5  01/28/24 Nustep level 5 x 6 minutes 25# HS curls 3 x10 Standing marches 2.5# LAQ 2x10 Blue tband clamshells Ball b/n knees squeeze Tried active hip ER, very poor strength, I supported leg and had him bring left foot to his right shin, but really could not perform the motion Supine bridges that I had him add to his HEP Supine heel slide with use of sliding board  01/26/24 Evaluation    PATIENT EDUCATION:  Education details: POC/Reviewed HEP Person educated: Patient Education method: Explanation Education comprehension: verbalized understanding  HOME EXERCISE PROGRAM: From hospital   ASSESSMENT:  CLINICAL IMPRESSION: Patient reports that he overdid it by going to the grocery store, left hip and thigh is very sore.  He comes in today using the [facility], we talked about safety with this and he reports that he uses the walker at night and when fatigued.  He has a lot of difficulty  with going from sit to stand, needs armrests.  He has pain and weakness with raising the leg up, to get in and out of bed and the car.  Patient is a 89 y.o. male who was seen today for physical therapy evaluation and treatment for s/p ;eft THA posterior approach.   He was having significant pain prior to the surgery so he had been limited with his ability.  He reports that for the most part he is without pain and only takes Tylenol .  He really struggles to get up from sitting, could not complete the 5XSTS test due to difficulty and pain.    OBJECTIVE IMPAIRMENTS: Abnormal gait, cardiopulmonary status limiting activity, decreased activity tolerance, decreased balance, decreased endurance, decreased mobility, difficulty walking, decreased ROM, decreased strength, increased edema, increased muscle spasms, impaired flexibility, postural dysfunction, and pain.   ACTIVITY LIMITATIONS: carrying, lifting, sitting, standing, squatting, sleeping, stairs, transfers, bed mobility, bathing, and locomotion  level  PARTICIPATION LIMITATIONS: cleaning, laundry, driving, shopping, community activity, and yard work  PERSONAL FACTORS: Age, Fitness, Past/current experiences, 1 comorbidity:  , and 3+ comorbidities: as above are also affecting patient's functional outcome.   REHAB POTENTIAL: Good  CLINICAL DECISION MAKING: Evolving/moderate complexity  EVALUATION COMPLEXITY: Moderate   GOALS: Goals reviewed with patient? Yes  SHORT TERM GOALS: Target date: 02/25/24 Independent with initial HEP Baseline: Goal status: progressing 02/02/24   LONG TERM GOALS: Target date: 04/26/24  Independent with advanced HEP Baseline:  Goal status: INITIAL  2.  Go up and down stairs step over step Baseline:  Goal status: INITIAL  3.  Get up from sitting without hands Baseline:  Goal status: INITIAL  4.  Decrease TUG time to 15 seconds Baseline: 32 seconds Goal status: INITIAL  5.  Walk without device or SPC for  all distances Baseline:  Goal status: INITIAL  6.  Walk 400 feet for Baseline:  Goal status: INITIAL   PLAN:  PT FREQUENCY: 2x/week  PT DURATION: 12 weeks  PLANNED INTERVENTIONS: 97164- PT Re-evaluation, 97110-Therapeutic exercises, 97530- Therapeutic activity, 97112- Neuromuscular re-education, 97535- Self Care, [id number]- Manual therapy, [id number]- Gait training, Patient/Family education, Balance training, Stair training, and Cryotherapy  PLAN FOR NEXT SESSION: advance walking, endurance, strength and function   Luay Balding W, PT 02/02/2024, 10:58 AM

## 2024-02-04 ENCOUNTER — Ambulatory Visit: Attending: Family Medicine | Admitting: Physical Therapy

## 2024-02-04 ENCOUNTER — Encounter: Payer: Self-pay | Admitting: Physical Therapy

## 2024-02-04 DIAGNOSIS — R252 Cramp and spasm: Secondary | ICD-10-CM | POA: Diagnosis not present

## 2024-02-04 DIAGNOSIS — M6281 Muscle weakness (generalized): Secondary | ICD-10-CM | POA: Diagnosis not present

## 2024-02-04 DIAGNOSIS — M25552 Pain in left hip: Secondary | ICD-10-CM | POA: Diagnosis not present

## 2024-02-04 DIAGNOSIS — R2681 Unsteadiness on feet: Secondary | ICD-10-CM | POA: Insufficient documentation

## 2024-02-04 DIAGNOSIS — R262 Difficulty in walking, not elsewhere classified: Secondary | ICD-10-CM | POA: Insufficient documentation

## 2024-02-04 DIAGNOSIS — R2689 Other abnormalities of gait and mobility: Secondary | ICD-10-CM | POA: Diagnosis not present

## 2024-02-04 NOTE — Therapy (Signed)
 OUTPATIENT PHYSICAL THERAPY LOWER EXTREMITY TREATMENT   Patient Name: Connor [name] MRN: [id number] DOB:[birth year], 89 y.o., male Today's Date: 02/04/2024  END OF SESSION:  PT End of Session - 02/04/24 1054     Visit Number 4    Number of Visits 24    Date for Recertification  04/26/24    Authorization Type [employer]    PT Start Time 1055    PT Stop Time 1140    PT Time Calculation (min) 45 min    Activity Tolerance Patient limited by fatigue;Patient tolerated treatment well    Behavior During Therapy [facility] for tasks assessed/performed          Past Medical History:  Diagnosis Date   Allergy 1985   Anemia    Benign localized prostatic hyperplasia with lower urinary tract symptoms (LUTS)    urologist-- dr matilda   Cancer [facility]) 07/2018   prostate   Cataract 12/2012   CHF (congestive heart failure) (HCC) ?   Chronic anticoagulation    Xarelto  for afib   Chronic constipation    Chronic cystitis    Chronic kidney disease    Clotting disorder    GERD (gastroesophageal reflux disease)    Gross hematuria    Heart murmur ?   History of DVT of lower extremity 06/08/2010   right femoral dvt dx post op total hip 05-08-2010   History of gastric ulcer    remote   History of kidney stones    one stone. remote hx   Hypercholesteremia    Hyperlipidemia ?   Hypertension    Dx by Dr. Salena Skill around age  41    Macular degeneration    dry in both eyes,  no injections  sees Dr. Alvia   NICM (nonischemic cardiomyopathy) [facility])    per echo 06-11-2017,  ef 45-50% with diffuse hypokinesis,  G1DD   OSA on CPAP 2017   compliant with CPAP.  managed by Dr Jude.   Osteoarthritis    Other urethral stricture, male, meatal    chronic;  pt self dilates   Paroxysmal atrial fibrillation [facility]) cardiologist-- dr kelsie   first dx 2009/   a. documented by Dr Comer on office EKG 9/12. b. maintained on Tikosyn  and Xarelto . c. s/p DCCV 's.  d.  x3  EP w/ ablation atrial fib last one  12-05-2016   Pre-diabetes    Premature ventricular contraction    Presence of Watchman left atrial appendage closure device 05/30/2022   Watchman FLX 24mm by Dr. Cindie   PVC's (premature ventricular contractions)    RA (rheumatoid arthritis) [facility])    rheumatologist--- Dr LORELEI Jacob,  treated with Humira   RBBB (right bundle branch block)    Hx of a fib, ablation Aug 2018   PVCs,   Pt had Watchman placed 05-30-2022   Rheumatoid arthritis (HCC)    Sleep apnea    Ulcer    Vitamin D  deficiency    Wears hearing aid in both ears    Past Surgical History:  Procedure Laterality Date   ABLATION OF DYSRHYTHMIC FOCUS  08/29/2015   ATRIAL FIBRILLATION ABLATION  12/05/2016   ATRIAL FIBRILLATION ABLATION N/A 12/05/2016   Procedure: Atrial Fibrillation Ablation;  Surgeon: kelsie Lynwood, MD;  Location: [facility] INVASIVE CV LAB;  Service: Cardiovascular;  Laterality: N/A;   ATRIAL FIBRILLATION ABLATION N/A 02/29/2020   Procedure: ATRIAL FIBRILLATION ABLATION;  Surgeon: kelsie Lynwood, MD;  Location: [facility] INVASIVE CV LAB;  Service: Cardiovascular;  Laterality: N/A;  ATRIAL FLUTTER ABLATION  09/2010   by MILUS EDWARDS Right 1990s   CARDIOVERSION N/A 10/28/2012   Procedure: CARDIOVERSION;  Surgeon: Lonni JONETTA Cash, MD;  Location: [facility] OR;  Service: Cardiovascular;  Laterality: N/A;   CATARACT EXTRACTION W/ INTRAOCULAR LENS  IMPLANT, BILATERAL  2019   CYSTOSCOPY WITH BIOPSY N/A 09/03/2018   Procedure: CYSTOSCOPY WITH FULGURATION CELESTA;  Surgeon: Matilda Senior, MD;  Location: [facility] ORS;  Service: Urology;  Laterality: N/A;  30 MINS   CYSTOSCOPY WITH FULGERATION N/A 01/23/2022   Procedure: CYSTOSCOPY WITH FULGERATION OF PROSTATIC URETHRA;  Surgeon: Matilda Senior, MD;  Location: [facility] ORS;  Service: Urology;  Laterality: N/A;  30 MINS   DENTAL SURGERY     ELECTROPHYSIOLOGIC STUDY N/A 08/29/2015   Procedure: Atrial Fibrillation Ablation;  Surgeon: Lynwood Rakers, MD;  Location: [facility] INVASIVE CV LAB;   Service: Cardiovascular;  Laterality: N/A;   EYE SURGERY     Cataracts   JOINT REPLACEMENT  1995, 1997   KNEE SURGERY Bilateral x3   1960s & 1970s   open   LEFT ATRIAL APPENDAGE OCCLUSION N/A 05/30/2022   Procedure: LEFT ATRIAL APPENDAGE OCCLUSION;  Surgeon: Cindie Ole DASEN, MD;  Location: [facility] INVASIVE CV LAB;  Service: Cardiovascular;  Laterality: N/A;   LEFT HEART CATHETERIZATION WITH CORONARY ANGIOGRAM N/A 06/07/2011   Procedure: LEFT HEART CATHETERIZATION WITH CORONARY ANGIOGRAM;  Surgeon: Peter M Swaziland, MD;  Location: [facility] CATH LAB;  Service: Cardiovascular;  Laterality: N/A;    Normal coronary arteries,  low normal LVF   TEE WITHOUT CARDIOVERSION N/A 05/30/2022   Procedure: TRANSESOPHAGEAL ECHOCARDIOGRAM (TEE);  Surgeon: Cindie Ole DASEN, MD;  Location: [facility] INVASIVE CV LAB;  Service: Cardiovascular;  Laterality: N/A;   TONSILLECTOMY AND ADENOIDECTOMY  age 64   TOTAL HIP ARTHROPLASTY Right 06/04/10    dr jane  @[facility]    TOTAL HIP ARTHROPLASTY Left 01/07/2024   Procedure: ARTHROPLASTY, HIP, TOTAL,POSTERIOR APPROACH;  Surgeon: Edna Toribio LABOR, MD;  Location: [facility] ORS;  Service: Orthopedics;  Laterality: Left;   TOTAL KNEE ARTHROPLASTY Bilateral right 1995;  left 1997   TRANSURETHRAL RESECTION OF PROSTATE  06-29-2002   dr humphries  @[facility]    TRANSURETHRAL RESECTION OF PROSTATE N/A 07/06/2018   Procedure: TRANSURETHRAL RESECTION OF THE PROSTATE (TURP);  Surgeon: Matilda Senior, MD;  Location: [facility];  Service: Urology;  Laterality: N/A;  45 MINS   Patient Active Problem List   Diagnosis Date Noted   Primary osteoarthritis of left hip 01/07/2024   Pain due to onychomycosis of toenails of both feet 07/03/2023   Atrial fibrillation (HCC) 05/30/2022   Presence of Watchman left atrial appendage closure device 05/30/2022   Hematuria 01/04/2022   Gross hematuria 01/04/2022   Secondary hypercoagulable state 03/28/2020   Malignant neoplasm of prostate (HCC) 09/11/2018    Enlarged prostate with urinary obstruction 07/06/2018   Gastrocnemius strain, right, initial encounter 12/10/2017   Baker's cyst of knee, right 12/10/2017   Obesity 03/24/2017   Paroxysmal atrial fibrillation (HCC) 12/05/2016   Near syncope 04/20/2016   Leg hematoma, left, initial encounter 04/20/2016   Bleeding    Ecchymosis    Left hamstring muscle strain    Muscle tear    RBBB 08/30/2015   Chronic systolic dysfunction of left ventricle 07/11/2014   OSA (obstructive sleep apnea) 04/07/2013   Multinodular goiter 01/20/2013   Cervical spondylosis without myelopathy 01/18/2013   Left ventricular dysfunction 12/17/2011   Low back pain 09/11/2011   Fatigue 05/22/2011   PVC (premature ventricular  contraction) 01/18/2011   Persistent atrial fibrillation (HCC) 01/12/2011   Hyperlipidemia 08/02/2010   Hypertension 08/02/2010   Osteoarthritis 08/02/2010   BPH (benign prostatic hyperplasia) 08/02/2010   GERD (gastroesophageal reflux disease) 08/02/2010   h/o Atrial flutter (HCC) s/p ablation 2012     PCP: Copland, MD  REFERRING PROVIDER: Edna, MD  REFERRING DIAG: S/P Left THA posterior  THERAPY DIAG:  Pain in left hip  Muscle weakness (generalized)  Unsteadiness on feet  Difficulty in walking, not elsewhere classified  Rationale for Evaluation and Treatment: Rehabilitation  ONSET DATE: 01/07/24  SUBJECTIVE:   SUBJECTIVE STATEMENT: Patient continues to come in using the [facility], reports doing well with this, reports that he was very worn out after the last visit, I was so tired I did not do much the rest of the day.   Patient underwent left posterior THA on 01/07/24, he has had Home PT until last week.  He reports that he has very little pain, just fatigue.  He reports that he has been doing the HEP given from the hospital.    PERTINENT HISTORY: A fib, prostate CA, LBP. HLD, HTN, OA, RA, bilateral TKA PAIN:  Are you having pain? Yes: NPRS scale: 0/10 Pain location:  left hip/back/thigh Pain description: spasm Aggravating factors: getting up from the chair pain up to 5/10 Relieving factors: rest, Tylenol , can have no pain  PRECAUTIONS: Posterior hip  RED FLAGS: None   WEIGHT BEARING RESTRICTIONS: No  FALLS:  Has patient fallen in last 6 months? No  LIVING ENVIRONMENT: Lives with: lives with their family and lives with their spouse Lives in: House/apartment Stairs: Yes: Internal: 14 steps; can reach both Has following equipment at home: Vannie - 2 wheeled  OCCUPATION: retired  PLOF: Independent and shopping, no device, some yard work  PATIENT GOALS: I want to walk without any device, I want to be able to do yard work  NEXT MD VISIT: October 16  OBJECTIVE:  Note: Objective measures were completed at Evaluation unless otherwise noted.  DIAGNOSTIC FINDINGS: N/A   COGNITION: Overall cognitive status: Within functional limits for tasks assessed     SENSATION: WFL  EDEMA:  Has visible edema in the left thigh and knee  MUSCLE LENGTH: Tight calves and HS  POSTURE: rounded shoulders, forward head, and decreased lumbar lordosis  PALPATION: Mild tenderness in the left thigh and hip, buttock  LOWER EXTREMITY ROM:  Active ROM Right eval Left eval  Hip flexion  45  Hip extension    Hip abduction  10  Hip adduction    Hip internal rotation    Hip external rotation    Knee flexion    Knee extension    Ankle dorsiflexion    Ankle plantarflexion    Ankle inversion    Ankle eversion     (Blank rows = not tested)  LOWER EXTREMITY MMT:  MMT Right eval Left eval  Hip flexion  3+  Hip extension    Hip abduction  3+  Hip adduction    Hip internal rotation    Hip external rotation    Knee flexion  4-  Knee extension  4-  Ankle dorsiflexion    Ankle plantarflexion    Ankle inversion    Ankle eversion     (Blank rows = not tested)  FUNCTIONAL TESTS:  5 times sit to stand: unable to do without hands, had to use the  arm rests and could only complete 3 due to pain and fatigue took 32  seconds to do 3 of the 5 Timed up and go (TUG): 32 seconds with FWW 3 minute walk test: 200 feet with the FWW  GAIT: Distance walked: 200 feet Assistive device utilized: Walker - 2 wheeled Level of assistance: SBA Comments: slow, left toe slightly turns in                                                                                                                                TREATMENT DATE:  02/04/24 Nustep level 5 x 7 minutes Leg curls 25# 3x10 Leg extension 5# 3x10 Sit to stand with weighted ball 2.5# marches 2.5# hip abduction Ball b/n knees squeeze Red tband clamshells Standing ball toss and then on airex with CGA Walking without device cues for speed and step length Backward walking  02/02/24 Nustep level 5 x 7 minutes 25# HS curls 3x10 5# Leg ext 2x10 Slant board stretch On airex ball toss Ball b/n knees squeeze Standing with FWW marches and abduction Feet on ball K2C, rotation, small bridge and isometric abs 2x10 for each Blue tband clamshells Sliding board with cloth 2x10 SLR 2x5 Right sidelying clamshells STS from elevated seating surface 2 x5  01/28/24 Nustep level 5 x 6 minutes 25# HS curls 3 x10 Standing marches 2.5# LAQ 2x10 Blue tband clamshells Ball b/n knees squeeze Tried active hip ER, very poor strength, I supported leg and had him bring left foot to his right shin, but really could not perform the motion Supine bridges that I had him add to his HEP Supine heel slide with use of sliding board  01/26/24 Evaluation    PATIENT EDUCATION:  Education details: POC/Reviewed HEP Person educated: Patient Education method: Explanation Education comprehension: verbalized understanding  HOME EXERCISE PROGRAM: From hospital   ASSESSMENT:  CLINICAL IMPRESSION: Patient reports that he was very tired after the last visit.  Reports that he did nothing the rest of the day and took  a nap.      He has a lot of difficulty with going from sit to stand, needs armrests.  We tried this repetitively with a higher surface again still difficult for him.  His walking was better today, less inward turn of the foot and able to go at a faster pace, biggest issue is the getting up and the first few steps.  He has pain and weakness with raising the leg up, to get in and out of bed and the car.  Patient is a 89 y.o. male who was seen today for physical therapy evaluation and treatment for s/p ;eft THA posterior approach.   He was having significant pain prior to the surgery so he had been limited with his ability.  He reports that for the most part he is without pain and only takes Tylenol .  He really struggles to get up from sitting, could not complete the 5XSTS test due to difficulty and pain.    OBJECTIVE IMPAIRMENTS: Abnormal gait,  cardiopulmonary status limiting activity, decreased activity tolerance, decreased balance, decreased endurance, decreased mobility, difficulty walking, decreased ROM, decreased strength, increased edema, increased muscle spasms, impaired flexibility, postural dysfunction, and pain.   ACTIVITY LIMITATIONS: carrying, lifting, sitting, standing, squatting, sleeping, stairs, transfers, bed mobility, bathing, and locomotion level  PARTICIPATION LIMITATIONS: cleaning, laundry, driving, shopping, community activity, and yard work  PERSONAL FACTORS: Age, Fitness, Past/current experiences, 1 comorbidity:  , and 3+ comorbidities: as above are also affecting patient's functional outcome.   REHAB POTENTIAL: Good  CLINICAL DECISION MAKING: Evolving/moderate complexity  EVALUATION COMPLEXITY: Moderate   GOALS: Goals reviewed with patient? Yes  SHORT TERM GOALS: Target date: 02/25/24 Independent with initial HEP Baseline: Goal status: progressing 02/02/24   LONG TERM GOALS: Target date: 04/26/24  Independent with advanced HEP Baseline:  Goal status:  INITIAL  2.  Go up and down stairs step over step Baseline:  Goal status: INITIAL  3.  Get up from sitting without hands Baseline:  Goal status: INITIAL  4.  Decrease TUG time to 15 seconds Baseline: 32 seconds Goal status: INITIAL  5.  Walk without device or SPC for all distances Baseline:  Goal status: INITIAL  6.  Walk 400 feet for Baseline:  Goal status: INITIAL   PLAN:  PT FREQUENCY: 2x/week  PT DURATION: 12 weeks  PLANNED INTERVENTIONS: 97164- PT Re-evaluation, 97110-Therapeutic exercises, 97530- Therapeutic activity, 97112- Neuromuscular re-education, 97535- Self Care, [id number]- Manual therapy, [phone]- Gait training, Patient/Family education, Balance training, Stair training, and Cryotherapy  PLAN FOR NEXT SESSION: advance walking, endurance, strength and function   Alajia Schmelzer W, PT 02/04/2024, 10:56 AM

## 2024-02-09 ENCOUNTER — Ambulatory Visit: Admitting: Physical Therapy

## 2024-02-09 ENCOUNTER — Encounter: Payer: Self-pay | Admitting: Physical Therapy

## 2024-02-09 DIAGNOSIS — R2681 Unsteadiness on feet: Secondary | ICD-10-CM | POA: Diagnosis not present

## 2024-02-09 DIAGNOSIS — R252 Cramp and spasm: Secondary | ICD-10-CM | POA: Diagnosis not present

## 2024-02-09 DIAGNOSIS — R262 Difficulty in walking, not elsewhere classified: Secondary | ICD-10-CM

## 2024-02-09 DIAGNOSIS — R2689 Other abnormalities of gait and mobility: Secondary | ICD-10-CM | POA: Diagnosis not present

## 2024-02-09 DIAGNOSIS — M25552 Pain in left hip: Secondary | ICD-10-CM

## 2024-02-09 DIAGNOSIS — M6281 Muscle weakness (generalized): Secondary | ICD-10-CM | POA: Diagnosis not present

## 2024-02-09 NOTE — Therapy (Signed)
 OUTPATIENT PHYSICAL THERAPY LOWER EXTREMITY TREATMENT   Patient Name: Connor [name] MRN: [id number] DOB:02-Mar-1934, 89 y.o., male Today's Date: 02/09/2024  END OF SESSION:  PT End of Session - 02/09/24 1055     Visit Number 5    Number of Visits 24    Date for Recertification  04/26/24    Authorization Type [employer]    PT Start Time 1055    PT Stop Time 1140    PT Time Calculation (min) 45 min    Activity Tolerance Patient limited by fatigue;Patient tolerated treatment well    Behavior During Therapy [facility] for tasks assessed/performed          Past Medical History:  Diagnosis Date   Allergy 1985   Anemia    Benign localized prostatic hyperplasia with lower urinary tract symptoms (LUTS)    urologist-- dr matilda   Cancer [facility]) 07/2018   prostate   Cataract 12/2012   CHF (congestive heart failure) (HCC) ?   Chronic anticoagulation    Xarelto  for afib   Chronic constipation    Chronic cystitis    Chronic kidney disease    Clotting disorder    GERD (gastroesophageal reflux disease)    Gross hematuria    Heart murmur ?   History of DVT of lower extremity 06/08/2010   right femoral dvt dx post op total hip 05-08-2010   History of gastric ulcer    remote   History of kidney stones    one stone. remote hx   Hypercholesteremia    Hyperlipidemia ?   Hypertension    Dx by Dr. Salena Skill around age  88    Macular degeneration    dry in both eyes,  no injections  sees Dr. Alvia   NICM (nonischemic cardiomyopathy) [facility])    per echo 06-11-2017,  ef 45-50% with diffuse hypokinesis,  G1DD   OSA on CPAP 2017   compliant with CPAP.  managed by Dr Jude.   Osteoarthritis    Other urethral stricture, male, meatal    chronic;  pt self dilates   Paroxysmal atrial fibrillation [facility]) cardiologist-- dr kelsie   first dx 2009/   a. documented by Dr Comer on office EKG 9/12. b. maintained on Tikosyn  and Xarelto . c. s/p DCCV 's.  d.  x3  EP w/ ablation atrial fib last one  12-05-2016   Pre-diabetes    Premature ventricular contraction    Presence of Watchman left atrial appendage closure device 05/30/2022   Watchman FLX 24mm by Dr. Cindie   PVC's (premature ventricular contractions)    RA (rheumatoid arthritis) [facility])    rheumatologist--- Dr LORELEI Jacob,  treated with Humira   RBBB (right bundle branch block)    Hx of a fib, ablation Aug 2018   PVCs,   Pt had Watchman placed 05-30-2022   Rheumatoid arthritis (HCC)    Sleep apnea    Ulcer    Vitamin D  deficiency    Wears hearing aid in both ears    Past Surgical History:  Procedure Laterality Date   ABLATION OF DYSRHYTHMIC FOCUS  08/29/2015   ATRIAL FIBRILLATION ABLATION  12/05/2016   ATRIAL FIBRILLATION ABLATION N/A 12/05/2016   Procedure: Atrial Fibrillation Ablation;  Surgeon: kelsie Lynwood, MD;  Location: [facility] INVASIVE CV LAB;  Service: Cardiovascular;  Laterality: N/A;   ATRIAL FIBRILLATION ABLATION N/A 02/29/2020   Procedure: ATRIAL FIBRILLATION ABLATION;  Surgeon: kelsie Lynwood, MD;  Location: [facility] INVASIVE CV LAB;  Service: Cardiovascular;  Laterality: N/A;  ATRIAL FLUTTER ABLATION  09/2010   by MILUS EDWARDS Right 1990s   CARDIOVERSION N/A 10/28/2012   Procedure: CARDIOVERSION;  Surgeon: Lonni JONETTA Cash, MD;  Location: [facility] OR;  Service: Cardiovascular;  Laterality: N/A;   CATARACT EXTRACTION W/ INTRAOCULAR LENS  IMPLANT, BILATERAL  2019   CYSTOSCOPY WITH BIOPSY N/A 09/03/2018   Procedure: CYSTOSCOPY WITH FULGURATION CELESTA;  Surgeon: Matilda Senior, MD;  Location: [facility] ORS;  Service: Urology;  Laterality: N/A;  30 MINS   CYSTOSCOPY WITH FULGERATION N/A 01/23/2022   Procedure: CYSTOSCOPY WITH FULGERATION OF PROSTATIC URETHRA;  Surgeon: Matilda Senior, MD;  Location: [facility] ORS;  Service: Urology;  Laterality: N/A;  30 MINS   DENTAL SURGERY     ELECTROPHYSIOLOGIC STUDY N/A 08/29/2015   Procedure: Atrial Fibrillation Ablation;  Surgeon: Lynwood Rakers, MD;  Location: [facility] INVASIVE CV LAB;   Service: Cardiovascular;  Laterality: N/A;   EYE SURGERY     Cataracts   JOINT REPLACEMENT  1995, 1997   KNEE SURGERY Bilateral x3   1960s & 1970s   open   LEFT ATRIAL APPENDAGE OCCLUSION N/A 05/30/2022   Procedure: LEFT ATRIAL APPENDAGE OCCLUSION;  Surgeon: Cindie Ole DASEN, MD;  Location: [facility] INVASIVE CV LAB;  Service: Cardiovascular;  Laterality: N/A;   LEFT HEART CATHETERIZATION WITH CORONARY ANGIOGRAM N/A 06/07/2011   Procedure: LEFT HEART CATHETERIZATION WITH CORONARY ANGIOGRAM;  Surgeon: Peter M Swaziland, MD;  Location[facility] CATH LAB;  Service: Cardiovascular;  Laterality: N/A;    Normal coronary arteries,  low normal LVF   TEE WITHOUT CARDIOVERSION N/A 05/30/2022   Procedure: TRANSESOPHAGEAL ECHOCARDIOGRAM (TEE);  Surgeon: Cindie Ole DASEN, MD;  Location: [facility] INVASIVE CV LAB;  Service: Cardiovascular;  Laterality: N/A;   TONSILLECTOMY AND ADENOIDECTOMY  age 54   TOTAL HIP ARTHROPLASTY Right 06/04/10    dr jane  @[facility]    TOTAL HIP ARTHROPLASTY Left 01/07/2024   Procedure: ARTHROPLASTY, HIP, TOTAL,POSTERIOR APPROACH;  Surgeon: Edna Toribio LABOR, MD;  Location: [facility] ORS;  Service: Orthopedics;  Laterality: Left;   TOTAL KNEE ARTHROPLASTY Bilateral right 1995;  left 1997   TRANSURETHRAL RESECTION OF PROSTATE  06-29-2002   dr humphries  @[facility]    TRANSURETHRAL RESECTION OF PROSTATE N/A 07/06/2018   Procedure: TRANSURETHRAL RESECTION OF THE PROSTATE (TURP);  Surgeon: Matilda Senior, MD;  Location[facility];  Service: Urology;  Laterality: N/A;  45 MINS   Patient Active Problem List   Diagnosis Date Noted   Primary osteoarthritis of left hip 01/07/2024   Pain due to onychomycosis of toenails of both feet 07/03/2023   Atrial fibrillation (HCC) 05/30/2022   Presence of Watchman left atrial appendage closure device 05/30/2022   Hematuria 01/04/2022   Gross hematuria 01/04/2022   Secondary hypercoagulable state 03/28/2020   Malignant neoplasm of prostate (HCC) 09/11/2018    Enlarged prostate with urinary obstruction 07/06/2018   Gastrocnemius strain, right, initial encounter 12/10/2017   Baker's cyst of knee, right 12/10/2017   Obesity 03/24/2017   Paroxysmal atrial fibrillation (HCC) 12/05/2016   Near syncope 04/20/2016   Leg hematoma, left, initial encounter 04/20/2016   Bleeding    Ecchymosis    Left hamstring muscle strain    Muscle tear    RBBB 08/30/2015   Chronic systolic dysfunction of left ventricle 07/11/2014   OSA (obstructive sleep apnea) 04/07/2013   Multinodular goiter 01/20/2013   Cervical spondylosis without myelopathy 01/18/2013   Left ventricular dysfunction 12/17/2011   Low back pain 09/11/2011   Fatigue 05/22/2011   PVC (premature ventricular  contraction) 01/18/2011   Persistent atrial fibrillation (HCC) 01/12/2011   Hyperlipidemia 08/02/2010   Hypertension 08/02/2010   Osteoarthritis 08/02/2010   BPH (benign prostatic hyperplasia) 08/02/2010   GERD (gastroesophageal reflux disease) 08/02/2010   h/o Atrial flutter (HCC) s/p ablation 2012     PCP: Copland, MD  REFERRING PROVIDER: Edna, MD  REFERRING DIAG: S/P Left THA posterior  THERAPY DIAG:  Pain in left hip  Muscle weakness (generalized)  Unsteadiness on feet  Difficulty in walking, not elsewhere classified  Other abnormalities of gait and mobility  Cramp and spasm  Rationale for Evaluation and Treatment: Rehabilitation  ONSET DATE: 01/07/24  SUBJECTIVE:   SUBJECTIVE STATEMENT: Patient reports that he is trying to use the leg more to get in and out of the car, reports that he was using his cane to help lift it up.   Patient underwent left posterior THA on 01/07/24, he has had Home PT until last week.  He reports that he has very little pain, just fatigue.  He reports that he has been doing the HEP given from the hospital.    PERTINENT HISTORY: A fib, prostate CA, LBP. HLD, HTN, OA, RA, bilateral TKA PAIN:  Are you having pain? Yes: NPRS scale:  0/10 Pain location: left hip/back/thigh Pain description: spasm Aggravating factors: getting up from the chair pain up to 5/10 Relieving factors: rest, Tylenol , can have no pain  PRECAUTIONS: Posterior hip  RED FLAGS: None   WEIGHT BEARING RESTRICTIONS: No  FALLS:  Has patient fallen in last 6 months? No  LIVING ENVIRONMENT: Lives with: lives with their family and lives with their spouse Lives in: House/apartment Stairs: Yes: Internal: 14 steps; can reach both Has following equipment at home: Vannie - 2 wheeled  OCCUPATION: retired  PLOF: Independent and shopping, no device, some yard work  PATIENT GOALS: I want to walk without any device, I want to be able to do yard work  NEXT MD VISIT: October 16  OBJECTIVE:  Note: Objective measures were completed at Evaluation unless otherwise noted.  DIAGNOSTIC FINDINGS: N/A   COGNITION: Overall cognitive status: Within functional limits for tasks assessed     SENSATION: WFL  EDEMA:  Has visible edema in the left thigh and knee  MUSCLE LENGTH: Tight calves and HS  POSTURE: rounded shoulders, forward head, and decreased lumbar lordosis  PALPATION: Mild tenderness in the left thigh and hip, buttock  LOWER EXTREMITY ROM:  Active ROM Right eval Left eval  Hip flexion  45  Hip extension    Hip abduction  10  Hip adduction    Hip internal rotation    Hip external rotation    Knee flexion    Knee extension    Ankle dorsiflexion    Ankle plantarflexion    Ankle inversion    Ankle eversion     (Blank rows = not tested)  LOWER EXTREMITY MMT:  MMT Right eval Left eval  Hip flexion  3+  Hip extension    Hip abduction  3+  Hip adduction    Hip internal rotation    Hip external rotation    Knee flexion  4-  Knee extension  4-  Ankle dorsiflexion    Ankle plantarflexion    Ankle inversion    Ankle eversion     (Blank rows = not tested)  FUNCTIONAL TESTS:  5 times sit to stand: unable to do without  hands, had to use the arm rests and could only complete 3 due to pain  and fatigue took 32 seconds to do 3 of the 5 Timed up and go (TUG): 32 seconds with FWW 3 minute walk test: 200 feet with the FWW  GAIT: Distance walked: 200 feet Assistive device utilized: Walker - 2 wheeled Level of assistance: SBA Comments: slow, left toe slightly turns in                                                                                                                                TREATMENT DATE:  02/09/24 Nustep level 5 x 7 minutes 2.5# Marches 2.5# hip abduction 2.5# hip extension Calf raises all above with the FWW. LEg curls 35# 3x10 5# leg extension 2x10 40# resisted gait forward and backward, with CGA and cues Feet on ball K2C, rotation, bridge, isometric abs Ball b/n knees squeeze Blue tband clamshells  02/04/24 Nustep level 5 x 7 minutes Leg curls 25# 3x10 Leg extension 5# 3x10 Sit to stand with weighted ball 2.5# marches 2.5# hip abduction Ball b/n knees squeeze Red tband clamshells Standing ball toss and then on airex with CGA Walking without device cues for speed and step length Backward walking  02/02/24 Nustep level 5 x 7 minutes 25# HS curls 3x10 5# Leg ext 2x10 Slant board stretch On airex ball toss Ball b/n knees squeeze Standing with FWW marches and abduction Feet on ball K2C, rotation, small bridge and isometric abs 2x10 for each Blue tband clamshells Sliding board with cloth 2x10 SLR 2x5 Right sidelying clamshells STS from elevated seating surface 2 x5  01/28/24 Nustep level 5 x 6 minutes 25# HS curls 3 x10 Standing marches 2.5# LAQ 2x10 Blue tband clamshells Ball b/n knees squeeze Tried active hip ER, very poor strength, I supported leg and had him bring left foot to his right shin, but really could not perform the motion Supine bridges that I had him add to his HEP Supine heel slide with use of sliding board  01/26/24 Evaluation    PATIENT  EDUCATION:  Education details: POC/Reviewed HEP Person educated: Patient Education method: Explanation Education comprehension: verbalized understanding  HOME EXERCISE PROGRAM: From hospital   ASSESSMENT:  CLINICAL IMPRESSION: Patient reports that he is lifting the leg better in and out of the car and bed, not having to use his hands as much, added the resisted walking fwd and backward today, needed CGA, did have two instances of a LOB but was able to correct.  Patient is a 89 y.o. male who was seen today for physical therapy evaluation and treatment for s/p ;eft THA posterior approach.   He was having significant pain prior to the surgery so he had been limited with his ability.  He reports that for the most part he is without pain and only takes Tylenol .  He really struggles to get up from sitting, could not complete the 5XSTS test due to difficulty and pain.    OBJECTIVE IMPAIRMENTS: Abnormal gait, cardiopulmonary status limiting activity,  decreased activity tolerance, decreased balance, decreased endurance, decreased mobility, difficulty walking, decreased ROM, decreased strength, increased edema, increased muscle spasms, impaired flexibility, postural dysfunction, and pain.   ACTIVITY LIMITATIONS: carrying, lifting, sitting, standing, squatting, sleeping, stairs, transfers, bed mobility, bathing, and locomotion level  PARTICIPATION LIMITATIONS: cleaning, laundry, driving, shopping, community activity, and yard work  PERSONAL FACTORS: Age, Fitness, Past/current experiences, 1 comorbidity:  , and 3+ comorbidities: as above are also affecting patient's functional outcome.   REHAB POTENTIAL: Good  CLINICAL DECISION MAKING: Evolving/moderate complexity  EVALUATION COMPLEXITY: Moderate   GOALS: Goals reviewed with patient? Yes  SHORT TERM GOALS: Target date: 02/25/24 Independent with initial HEP Baseline: Goal status: progressing 02/02/24   LONG TERM GOALS: Target date:  04/26/24  Independent with advanced HEP Baseline:  Goal status: INITIAL  2.  Go up and down stairs step over step Baseline:  Goal status: INITIAL  3.  Get up from sitting without hands Baseline:  Goal status: INITIAL  4.  Decrease TUG time to 15 seconds Baseline: 32 seconds Goal status: INITIAL  5.  Walk without device or SPC for all distances Baseline:  Goal status: INITIAL  6.  Walk 400 feet for Baseline:  Goal status: INITIAL   PLAN:  PT FREQUENCY: 2x/week  PT DURATION: 12 weeks  PLANNED INTERVENTIONS: 97164- PT Re-evaluation, 97110-Therapeutic exercises, 97530- Therapeutic activity, 97112- Neuromuscular re-education, 97535- Self Care, [id number]- Manual therapy, [phone]- Gait training, Patient/Family education, Balance training, Stair training, and Cryotherapy  PLAN FOR NEXT SESSION: advance walking, endurance, strength and function, assess TUG and work on gait   [employer], PT 02/09/2024, 10:55 AM

## 2024-02-11 ENCOUNTER — Ambulatory Visit: Admitting: Physical Therapy

## 2024-02-11 ENCOUNTER — Encounter: Payer: Self-pay | Admitting: Physical Therapy

## 2024-02-11 DIAGNOSIS — M6281 Muscle weakness (generalized): Secondary | ICD-10-CM

## 2024-02-11 DIAGNOSIS — R252 Cramp and spasm: Secondary | ICD-10-CM | POA: Diagnosis not present

## 2024-02-11 DIAGNOSIS — M25552 Pain in left hip: Secondary | ICD-10-CM

## 2024-02-11 DIAGNOSIS — R2681 Unsteadiness on feet: Secondary | ICD-10-CM | POA: Diagnosis not present

## 2024-02-11 DIAGNOSIS — R262 Difficulty in walking, not elsewhere classified: Secondary | ICD-10-CM

## 2024-02-11 DIAGNOSIS — R2689 Other abnormalities of gait and mobility: Secondary | ICD-10-CM | POA: Diagnosis not present

## 2024-02-11 NOTE — Therapy (Signed)
 OUTPATIENT PHYSICAL THERAPY LOWER EXTREMITY TREATMENT   Patient Name: Connor [name] MRN: [id number] DOB:1933-05-18, 89 y.o., male Today's Date: 02/11/2024  END OF SESSION:  PT End of Session - 02/11/24 1053     Visit Number 6    Number of Visits 24    Date for Recertification  04/26/24    Authorization Type [employer]    PT Start Time 1053    PT Stop Time 1140    PT Time Calculation (min) 47 min    Activity Tolerance Patient limited by fatigue;Patient tolerated treatment well    Behavior During Therapy [facility] for tasks assessed/performed          Past Medical History:  Diagnosis Date   Allergy 1985   Anemia    Benign localized prostatic hyperplasia with lower urinary tract symptoms (LUTS)    urologist-- dr matilda   Cancer [facility]) 07/2018   prostate   Cataract 12/2012   CHF (congestive heart failure) (HCC) ?   Chronic anticoagulation    Xarelto  for afib   Chronic constipation    Chronic cystitis    Chronic kidney disease    Clotting disorder    GERD (gastroesophageal reflux disease)    Gross hematuria    Heart murmur ?   History of DVT of lower extremity 06/08/2010   right femoral dvt dx post op total hip 05-08-2010   History of gastric ulcer    remote   History of kidney stones    one stone. remote hx   Hypercholesteremia    Hyperlipidemia ?   Hypertension    Dx by Dr. Salena Skill around age  41    Macular degeneration    dry in both eyes,  no injections  sees Dr. Alvia   NICM (nonischemic cardiomyopathy) [facility])    per echo 06-11-2017,  ef 45-50% with diffuse hypokinesis,  G1DD   OSA on CPAP 2017   compliant with CPAP.  managed by Dr Jude.   Osteoarthritis    Other urethral stricture, male, meatal    chronic;  pt self dilates   Paroxysmal atrial fibrillation [facility]) cardiologist-- dr kelsie   first dx 2009/   a. documented by Dr Comer on office EKG 9/12. b. maintained on Tikosyn  and Xarelto . c. s/p DCCV 's.  d.  x3  EP w/ ablation atrial fib last one  12-05-2016   Pre-diabetes    Premature ventricular contraction    Presence of Watchman left atrial appendage closure device 05/30/2022   Watchman FLX 24mm by Dr. Cindie   PVC's (premature ventricular contractions)    RA (rheumatoid arthritis) [facility])    rheumatologist--- Dr LORELEI Jacob,  treated with Humira   RBBB (right bundle branch block)    Hx of a fib, ablation Aug 2018   PVCs,   Pt had Watchman placed 05-30-2022   Rheumatoid arthritis (HCC)    Sleep apnea    Ulcer    Vitamin D  deficiency    Wears hearing aid in both ears    Past Surgical History:  Procedure Laterality Date   ABLATION OF DYSRHYTHMIC FOCUS  08/29/2015   ATRIAL FIBRILLATION ABLATION  12/05/2016   ATRIAL FIBRILLATION ABLATION N/A 12/05/2016   Procedure: Atrial Fibrillation Ablation;  Surgeon: kelsie Lynwood, MD;  Location: [facility] INVASIVE CV LAB;  Service: Cardiovascular;  Laterality: N/A;   ATRIAL FIBRILLATION ABLATION N/A 02/29/2020   Procedure: ATRIAL FIBRILLATION ABLATION;  Surgeon: kelsie Lynwood, MD;  Location: [facility] INVASIVE CV LAB;  Service: Cardiovascular;  Laterality: N/A;  ATRIAL FLUTTER ABLATION  09/2010   by MILUS EDWARDS Right 1990s   CARDIOVERSION N/A 10/28/2012   Procedure: CARDIOVERSION;  Surgeon: Lonni JONETTA Cash, MD;  Location: [facility] OR;  Service: Cardiovascular;  Laterality: N/A;   CATARACT EXTRACTION W/ INTRAOCULAR LENS  IMPLANT, BILATERAL  2019   CYSTOSCOPY WITH BIOPSY N/A 09/03/2018   Procedure: CYSTOSCOPY WITH FULGURATION CELESTA;  Surgeon: Matilda Senior, MD;  Location: [facility] ORS;  Service: Urology;  Laterality: N/A;  30 MINS   CYSTOSCOPY WITH FULGERATION N/A 01/23/2022   Procedure: CYSTOSCOPY WITH FULGERATION OF PROSTATIC URETHRA;  Surgeon: Matilda Senior, MD;  Location: [facility] ORS;  Service: Urology;  Laterality: N/A;  30 MINS   DENTAL SURGERY     ELECTROPHYSIOLOGIC STUDY N/A 08/29/2015   Procedure: Atrial Fibrillation Ablation;  Surgeon: Lynwood Rakers, MD;  Location: [facility] INVASIVE CV LAB;   Service: Cardiovascular;  Laterality: N/A;   EYE SURGERY     Cataracts   JOINT REPLACEMENT  1995, 1997   KNEE SURGERY Bilateral x3   1960s & 1970s   open   LEFT ATRIAL APPENDAGE OCCLUSION N/A 05/30/2022   Procedure: LEFT ATRIAL APPENDAGE OCCLUSION;  Surgeon: Cindie Ole DASEN, MD;  Location: [facility] INVASIVE CV LAB;  Service: Cardiovascular;  Laterality: N/A;   LEFT HEART CATHETERIZATION WITH CORONARY ANGIOGRAM N/A 06/07/2011   Procedure: LEFT HEART CATHETERIZATION WITH CORONARY ANGIOGRAM;  Surgeon: Peter M Swaziland, MD;  Location: [facility] CATH LAB;  Service: Cardiovascular;  Laterality: N/A;    Normal coronary arteries,  low normal LVF   TEE WITHOUT CARDIOVERSION N/A 05/30/2022   Procedure: TRANSESOPHAGEAL ECHOCARDIOGRAM (TEE);  Surgeon: Cindie Ole DASEN, MD;  Location: [facility] INVASIVE CV LAB;  Service: Cardiovascular;  Laterality: N/A;   TONSILLECTOMY AND ADENOIDECTOMY  age 68   TOTAL HIP ARTHROPLASTY Right 06/04/10    dr jane  @[facility]    TOTAL HIP ARTHROPLASTY Left 01/07/2024   Procedure: ARTHROPLASTY, HIP, TOTAL,POSTERIOR APPROACH;  Surgeon: Edna Toribio LABOR, MD;  Location: [facility] ORS;  Service: Orthopedics;  Laterality: Left;   TOTAL KNEE ARTHROPLASTY Bilateral right 1995;  left 1997   TRANSURETHRAL RESECTION OF PROSTATE  06-29-2002   dr humphries  @[facility]    TRANSURETHRAL RESECTION OF PROSTATE N/A 07/06/2018   Procedure: TRANSURETHRAL RESECTION OF THE PROSTATE (TURP);  Surgeon: Matilda Senior, MD;  Location: [facility];  Service: Urology;  Laterality: N/A;  45 MINS   Patient Active Problem List   Diagnosis Date Noted   Primary osteoarthritis of left hip 01/07/2024   Pain due to onychomycosis of toenails of both feet 07/03/2023   Atrial fibrillation (HCC) 05/30/2022   Presence of Watchman left atrial appendage closure device 05/30/2022   Hematuria 01/04/2022   Gross hematuria 01/04/2022   Secondary hypercoagulable state 03/28/2020   Malignant neoplasm of prostate (HCC) 09/11/2018    Enlarged prostate with urinary obstruction 07/06/2018   Gastrocnemius strain, right, initial encounter 12/10/2017   Baker's cyst of knee, right 12/10/2017   Obesity 03/24/2017   Paroxysmal atrial fibrillation (HCC) 12/05/2016   Near syncope 04/20/2016   Leg hematoma, left, initial encounter 04/20/2016   Bleeding    Ecchymosis    Left hamstring muscle strain    Muscle tear    RBBB 08/30/2015   Chronic systolic dysfunction of left ventricle 07/11/2014   OSA (obstructive sleep apnea) 04/07/2013   Multinodular goiter 01/20/2013   Cervical spondylosis without myelopathy 01/18/2013   Left ventricular dysfunction 12/17/2011   Low back pain 09/11/2011   Fatigue 05/22/2011   PVC (premature ventricular  contraction) 01/18/2011   Persistent atrial fibrillation (HCC) 01/12/2011   Hyperlipidemia 08/02/2010   Hypertension 08/02/2010   Osteoarthritis 08/02/2010   BPH (benign prostatic hyperplasia) 08/02/2010   GERD (gastroesophageal reflux disease) 08/02/2010   h/o Atrial flutter (HCC) s/p ablation 2012     PCP: Copland, MD  REFERRING PROVIDER: Edna, MD  REFERRING DIAG: S/P Left THA posterior  THERAPY DIAG:  Pain in left hip  Muscle weakness (generalized)  Unsteadiness on feet  Difficulty in walking, not elsewhere classified  Other abnormalities of gait and mobility  Cramp and spasm  Rationale for Evaluation and Treatment: Rehabilitation  ONSET DATE: 01/07/24  SUBJECTIVE:   SUBJECTIVE STATEMENT: I am doing okay, a little sore, I have not done a lot of walking yet   Patient underwent left posterior THA on 01/07/24, he has had Home PT until last week.  He reports that he has very little pain, just fatigue.  He reports that he has been doing the HEP given from the hospital.    PERTINENT HISTORY: A fib, prostate CA, LBP. HLD, HTN, OA, RA, bilateral TKA PAIN:  Are you having pain? Yes: NPRS scale: 0/10 Pain location: left hip/back/thigh Pain description:  spasm Aggravating factors: getting up from the chair pain up to 5/10 Relieving factors: rest, Tylenol , can have no pain  PRECAUTIONS: Posterior hip  RED FLAGS: None   WEIGHT BEARING RESTRICTIONS: No  FALLS:  Has patient fallen in last 6 months? No  LIVING ENVIRONMENT: Lives with: lives with their family and lives with their spouse Lives in: House/apartment Stairs: Yes: Internal: 14 steps; can reach both Has following equipment at home: Vannie - 2 wheeled  OCCUPATION: retired  PLOF: Independent and shopping, no device, some yard work  PATIENT GOALS: I want to walk without any device, I want to be able to do yard work  NEXT MD VISIT: October 16  OBJECTIVE:  Note: Objective measures were completed at Evaluation unless otherwise noted.  DIAGNOSTIC FINDINGS: N/A   COGNITION: Overall cognitive status: Within functional limits for tasks assessed     SENSATION: WFL  EDEMA:  Has visible edema in the left thigh and knee  MUSCLE LENGTH: Tight calves and HS  POSTURE: rounded shoulders, forward head, and decreased lumbar lordosis  PALPATION: Mild tenderness in the left thigh and hip, buttock  LOWER EXTREMITY ROM:  Active ROM Right eval Left eval  Hip flexion  45  Hip extension    Hip abduction  10  Hip adduction    Hip internal rotation    Hip external rotation    Knee flexion    Knee extension    Ankle dorsiflexion    Ankle plantarflexion    Ankle inversion    Ankle eversion     (Blank rows = not tested)  LOWER EXTREMITY MMT:  MMT Right eval Left eval  Hip flexion  3+  Hip extension    Hip abduction  3+  Hip adduction    Hip internal rotation    Hip external rotation    Knee flexion  4-  Knee extension  4-  Ankle dorsiflexion    Ankle plantarflexion    Ankle inversion    Ankle eversion     (Blank rows = not tested)  FUNCTIONAL TESTS:  5 times sit to stand: unable to do without hands, had to use the arm rests and could only complete 3 due  to pain and fatigue took 32 seconds to do 3 of the 5 Timed up and go (  TUG): 32 seconds with FWW.  02/11/24 20 seconds with SPC 3 minute walk test: 200 feet with the FWW  GAIT: Distance walked: 200 feet Assistive device utilized: Walker - 2 wheeled Level of assistance: SBA Comments: slow, left toe slightly turns in                                                                                                                                TREATMENT DATE:  02/11/24 Nustep level 5 x 7 minutes Gait outside with SPC around a/2 the parking [place], CGA for curbs and 2 LOB but able to self right, then 2 rest breaks due to fatigue LEg curls 25# 2 x12 LEg ext 5# 2x12 TUG 20 seconds with SPC 2.5# marches, abduction and extension 2x10 each Feet on ball K2C, rotation, small bridge, isometric abs Blue tband clamshells Ball b/n knees squeeze  02/09/24 Nustep level 5 x 7 minutes 2.5# Marches 2.5# hip abduction 2.5# hip extension Calf raises all above with the FWW. LEg curls 35# 3x10 5# leg extension 2x10 40# resisted gait forward and backward, with CGA and cues Feet on ball K2C, rotation, bridge, isometric abs Ball b/n knees squeeze Blue tband clamshells  02/04/24 Nustep level 5 x 7 minutes Leg curls 25# 3x10 Leg extension 5# 3x10 Sit to stand with weighted ball 2.5# marches 2.5# hip abduction Ball b/n knees squeeze Red tband clamshells Standing ball toss and then on airex with CGA Walking without device cues for speed and step length Backward walking  02/02/24 Nustep level 5 x 7 minutes 25# HS curls 3x10 5# Leg ext 2x10 Slant board stretch On airex ball toss Ball b/n knees squeeze Standing with FWW marches and abduction Feet on ball K2C, rotation, small bridge and isometric abs 2x10 for each Blue tband clamshells Sliding board with cloth 2x10 SLR 2x5 Right sidelying clamshells STS from elevated seating surface 2 x5  01/28/24 Nustep level 5 x 6 minutes 25# HS curls 3  x10 Standing marches 2.5# LAQ 2x10 Blue tband clamshells Ball b/n knees squeeze Tried active hip ER, very poor strength, I supported leg and had him bring left foot to his right shin, but really could not perform the motion Supine bridges that I had him add to his HEP Supine heel slide with use of sliding board  01/26/24 Evaluation    PATIENT EDUCATION:  Education details: POC/Reviewed HEP Person educated: Patient Education method: Explanation Education comprehension: verbalized understanding  HOME EXERCISE PROGRAM: From hospital   ASSESSMENT:  CLINICAL IMPRESSION: Patient really improved his TUG down from 32 seconds to 20 seconds and from FWW to a SPC.  He really struggles with fatigue, With the gait outside he had to rest twice due to fatigue  Patient is a 89 y.o. male who was seen today for physical therapy evaluation and treatment for s/p ;eft THA posterior approach.   He was having significant pain prior to the surgery so he had  been limited with his ability.  He reports that for the most part he is without pain and only takes Tylenol .  He really struggles to get up from sitting, could not complete the 5XSTS test due to difficulty and pain.    OBJECTIVE IMPAIRMENTS: Abnormal gait, cardiopulmonary status limiting activity, decreased activity tolerance, decreased balance, decreased endurance, decreased mobility, difficulty walking, decreased ROM, decreased strength, increased edema, increased muscle spasms, impaired flexibility, postural dysfunction, and pain.   ACTIVITY LIMITATIONS: carrying, lifting, sitting, standing, squatting, sleeping, stairs, transfers, bed mobility, bathing, and locomotion level  PARTICIPATION LIMITATIONS: cleaning, laundry, driving, shopping, community activity, and yard work  PERSONAL FACTORS: Age, Fitness, Past/current experiences, 1 comorbidity:  , and 3+ comorbidities: as above are also affecting patient's functional outcome.   REHAB POTENTIAL:  Good  CLINICAL DECISION MAKING: Evolving/moderate complexity  EVALUATION COMPLEXITY: Moderate   GOALS: Goals reviewed with patient? Yes  SHORT TERM GOALS: Target date: 02/25/24 Independent with initial HEP Baseline: Goal status:met 02/11/24   LONG TERM GOALS: Target date: 04/26/24  Independent with advanced HEP Baseline:  Goal status: INITIAL  2.  Go up and down stairs step over step Baseline:  Goal status: INITIAL  3.  Get up from sitting without hands Baseline:  Goal status: INITIAL  4.  Decrease TUG time to 15 seconds Baseline: 32 seconds Goal status: progressing 20 seconds 02/11/24  5.  Walk without device or SPC for all distances Baseline:  Goal status: INITIAL  6.  Walk 400 feet for Baseline:  Goal status: INITIAL   PLAN:  PT FREQUENCY: 2x/week  PT DURATION: 12 weeks  PLANNED INTERVENTIONS: 97164- PT Re-evaluation, 97110-Therapeutic exercises, 97530- Therapeutic activity, 97112- Neuromuscular re-education, 97535- Self Care, [id number]- Manual therapy, [phone]- Gait training, Patient/Family education, Balance training, Stair training, and Cryotherapy  PLAN FOR NEXT SESSION: advance walking, endurance, strength and function,    Connor [name] W, PT 02/11/2024, 10:55 AM

## 2024-02-16 ENCOUNTER — Ambulatory Visit: Admitting: Physical Therapy

## 2024-02-16 ENCOUNTER — Encounter: Payer: Self-pay | Admitting: Physical Therapy

## 2024-02-16 DIAGNOSIS — M25552 Pain in left hip: Secondary | ICD-10-CM | POA: Diagnosis not present

## 2024-02-16 DIAGNOSIS — R252 Cramp and spasm: Secondary | ICD-10-CM

## 2024-02-16 DIAGNOSIS — R2681 Unsteadiness on feet: Secondary | ICD-10-CM

## 2024-02-16 DIAGNOSIS — M6281 Muscle weakness (generalized): Secondary | ICD-10-CM | POA: Diagnosis not present

## 2024-02-16 DIAGNOSIS — R262 Difficulty in walking, not elsewhere classified: Secondary | ICD-10-CM

## 2024-02-16 DIAGNOSIS — R2689 Other abnormalities of gait and mobility: Secondary | ICD-10-CM | POA: Diagnosis not present

## 2024-02-16 NOTE — Therapy (Signed)
 OUTPATIENT PHYSICAL THERAPY LOWER EXTREMITY TREATMENT   Patient Name: Connor [name] MRN: [id number] DOB:Apr 24, 1934, 89 y.o., male Today's Date: 02/16/2024  END OF SESSION:  PT End of Session - 02/16/24 1056     Visit Number 7    Number of Visits 24    Date for Recertification  04/26/24    Authorization Type [employer]    PT Start Time 1053    PT Stop Time 1140    PT Time Calculation (min) 47 min    Activity Tolerance Patient limited by fatigue;Patient tolerated treatment well    Behavior During Therapy [facility] for tasks assessed/performed          Past Medical History:  Diagnosis Date   Allergy 1985   Anemia    Benign localized prostatic hyperplasia with lower urinary tract symptoms (LUTS)    urologist-- dr matilda   Cancer [facility]) 07/2018   prostate   Cataract 12/2012   CHF (congestive heart failure) (HCC) ?   Chronic anticoagulation    Xarelto  for afib   Chronic constipation    Chronic cystitis    Chronic kidney disease    Clotting disorder    GERD (gastroesophageal reflux disease)    Gross hematuria    Heart murmur ?   History of DVT of lower extremity 06/08/2010   right femoral dvt dx post op total hip 05-08-2010   History of gastric ulcer    remote   History of kidney stones    one stone. remote hx   Hypercholesteremia    Hyperlipidemia ?   Hypertension    Dx by Dr. Salena Skill around age  73    Macular degeneration    dry in both eyes,  no injections  sees Dr. Alvia   NICM (nonischemic cardiomyopathy) [facility])    per echo 06-11-2017,  ef 45-50% with diffuse hypokinesis,  G1DD   OSA on CPAP 2017   compliant with CPAP.  managed by Dr Jude.   Osteoarthritis    Other urethral stricture, male, meatal    chronic;  pt self dilates   Paroxysmal atrial fibrillation [facility]) cardiologist-- dr kelsie   first dx 2009/   a. documented by Dr Comer on office EKG 9/12. b. maintained on Tikosyn  and Xarelto . c. s/p DCCV 's.  d.  x3  EP w/ ablation atrial fib last one  12-05-2016   Pre-diabetes    Premature ventricular contraction    Presence of Watchman left atrial appendage closure device 05/30/2022   Watchman FLX 24mm by Dr. Cindie   PVC's (premature ventricular contractions)    RA (rheumatoid arthritis) [facility])    rheumatologist--- Dr LORELEI Jacob,  treated with Humira   RBBB (right bundle branch block)    Hx of a fib, ablation Aug 2018   PVCs,   Pt had Watchman placed 05-30-2022   Rheumatoid arthritis (HCC)    Sleep apnea    Ulcer    Vitamin D  deficiency    Wears hearing aid in both ears    Past Surgical History:  Procedure Laterality Date   ABLATION OF DYSRHYTHMIC FOCUS  08/29/2015   ATRIAL FIBRILLATION ABLATION  12/05/2016   ATRIAL FIBRILLATION ABLATION N/A 12/05/2016   Procedure: Atrial Fibrillation Ablation;  Surgeon: kelsie Lynwood, MD;  Location: [facility] INVASIVE CV LAB;  Service: Cardiovascular;  Laterality: N/A;   ATRIAL FIBRILLATION ABLATION N/A 02/29/2020   Procedure: ATRIAL FIBRILLATION ABLATION;  Surgeon: kelsie Lynwood, MD;  Location: [facility] INVASIVE CV LAB;  Service: Cardiovascular;  Laterality: N/A;  ATRIAL FLUTTER ABLATION  09/2010   by MILUS EDWARDS Right 1990s   CARDIOVERSION N/A 10/28/2012   Procedure: CARDIOVERSION;  Surgeon: Lonni JONETTA Cash, MD;  Location: [facility] OR;  Service: Cardiovascular;  Laterality: N/A;   CATARACT EXTRACTION W/ INTRAOCULAR LENS  IMPLANT, BILATERAL  2019   CYSTOSCOPY WITH BIOPSY N/A 09/03/2018   Procedure: CYSTOSCOPY WITH FULGURATION CELESTA;  Surgeon: Matilda Senior, MD;  Location: [facility] ORS;  Service: Urology;  Laterality: N/A;  30 MINS   CYSTOSCOPY WITH FULGERATION N/A 01/23/2022   Procedure: CYSTOSCOPY WITH FULGERATION OF PROSTATIC URETHRA;  Surgeon: Matilda Senior, MD;  Location: [facility] ORS;  Service: Urology;  Laterality: N/A;  30 MINS   DENTAL SURGERY     ELECTROPHYSIOLOGIC STUDY N/A 08/29/2015   Procedure: Atrial Fibrillation Ablation;  Surgeon: Lynwood Rakers, MD;  Location: [facility] INVASIVE CV LAB;   Service: Cardiovascular;  Laterality: N/A;   EYE SURGERY     Cataracts   JOINT REPLACEMENT  1995, 1997   KNEE SURGERY Bilateral x3   1960s & 1970s   open   LEFT ATRIAL APPENDAGE OCCLUSION N/A 05/30/2022   Procedure: LEFT ATRIAL APPENDAGE OCCLUSION;  Surgeon: Cindie Ole DASEN, MD;  Location: [facility] INVASIVE CV LAB;  Service: Cardiovascular;  Laterality: N/A;   LEFT HEART CATHETERIZATION WITH CORONARY ANGIOGRAM N/A 06/07/2011   Procedure: LEFT HEART CATHETERIZATION WITH CORONARY ANGIOGRAM;  Surgeon: Peter M Swaziland, MD;  Location: [facility] CATH LAB;  Service: Cardiovascular;  Laterality: N/A;    Normal coronary arteries,  low normal LVF   TEE WITHOUT CARDIOVERSION N/A 05/30/2022   Procedure: TRANSESOPHAGEAL ECHOCARDIOGRAM (TEE);  Surgeon: Cindie Ole DASEN, MD;  Location: [facility] INVASIVE CV LAB;  Service: Cardiovascular;  Laterality: N/A;   TONSILLECTOMY AND ADENOIDECTOMY  age 28   TOTAL HIP ARTHROPLASTY Right 06/04/10    dr jane  @[facility]    TOTAL HIP ARTHROPLASTY Left 01/07/2024   Procedure: ARTHROPLASTY, HIP, TOTAL,POSTERIOR APPROACH;  Surgeon: Edna Toribio LABOR, MD;  Location: [facility] ORS;  Service: Orthopedics;  Laterality: Left;   TOTAL KNEE ARTHROPLASTY Bilateral right 1995;  left 1997   TRANSURETHRAL RESECTION OF PROSTATE  06-29-2002   dr humphries  @[facility]    TRANSURETHRAL RESECTION OF PROSTATE N/A 07/06/2018   Procedure: TRANSURETHRAL RESECTION OF THE PROSTATE (TURP);  Surgeon: Matilda Senior, MD;  Location: [facility];  Service: Urology;  Laterality: N/A;  45 MINS   Patient Active Problem List   Diagnosis Date Noted   Primary osteoarthritis of left hip 01/07/2024   Pain due to onychomycosis of toenails of both feet 07/03/2023   Atrial fibrillation (HCC) 05/30/2022   Presence of Watchman left atrial appendage closure device 05/30/2022   Hematuria 01/04/2022   Gross hematuria 01/04/2022   Secondary hypercoagulable state 03/28/2020   Malignant neoplasm of prostate (HCC) 09/11/2018    Enlarged prostate with urinary obstruction 07/06/2018   Gastrocnemius strain, right, initial encounter 12/10/2017   Baker's cyst of knee, right 12/10/2017   Obesity 03/24/2017   Paroxysmal atrial fibrillation (HCC) 12/05/2016   Near syncope 04/20/2016   Leg hematoma, left, initial encounter 04/20/2016   Bleeding    Ecchymosis    Left hamstring muscle strain    Muscle tear    RBBB 08/30/2015   Chronic systolic dysfunction of left ventricle 07/11/2014   OSA (obstructive sleep apnea) 04/07/2013   Multinodular goiter 01/20/2013   Cervical spondylosis without myelopathy 01/18/2013   Left ventricular dysfunction 12/17/2011   Low back pain 09/11/2011   Fatigue 05/22/2011   PVC (premature ventricular  contraction) 01/18/2011   Persistent atrial fibrillation (HCC) 01/12/2011   Hyperlipidemia 08/02/2010   Hypertension 08/02/2010   Osteoarthritis 08/02/2010   BPH (benign prostatic hyperplasia) 08/02/2010   GERD (gastroesophageal reflux disease) 08/02/2010   h/o Atrial flutter (HCC) s/p ablation 2012     PCP: Copland, MD  REFERRING PROVIDER: Edna, MD  REFERRING DIAG: S/P Left THA posterior  THERAPY DIAG:  Pain in left hip  Muscle weakness (generalized)  Unsteadiness on feet  Difficulty in walking, not elsewhere classified  Other abnormalities of gait and mobility  Cramp and spasm  Rationale for Evaluation and Treatment: Rehabilitation  ONSET DATE: 01/07/24  SUBJECTIVE:   SUBJECTIVE STATEMENT: I am tired I am walking some inside the house without [facility], I just feel tired all the time   Patient underwent left posterior THA on 01/07/24, he has had Home PT until last week.  He reports that he has very little pain, just fatigue.  He reports that he has been doing the HEP given from the hospital.    PERTINENT HISTORY: A fib, prostate CA, LBP. HLD, HTN, OA, RA, bilateral TKA PAIN:  Are you having pain? Yes: NPRS scale: 0/10 Pain location: left hip/back/thigh Pain  description: spasm Aggravating factors: getting up from the chair pain up to 5/10 Relieving factors: rest, Tylenol , can have no pain  PRECAUTIONS: Posterior hip  RED FLAGS: None   WEIGHT BEARING RESTRICTIONS: No  FALLS:  Has patient fallen in last 6 months? No  LIVING ENVIRONMENT: Lives with: lives with their family and lives with their spouse Lives in: House/apartment Stairs: Yes: Internal: 14 steps; can reach both Has following equipment at home: Vannie - 2 wheeled  OCCUPATION: retired  PLOF: Independent and shopping, no device, some yard work  PATIENT GOALS: I want to walk without any device, I want to be able to do yard work  NEXT MD VISIT: October 16  OBJECTIVE:  Note: Objective measures were completed at Evaluation unless otherwise noted.  DIAGNOSTIC FINDINGS: N/A   COGNITION: Overall cognitive status: Within functional limits for tasks assessed     SENSATION: WFL  EDEMA:  Has visible edema in the left thigh and knee  MUSCLE LENGTH: Tight calves and HS  POSTURE: rounded shoulders, forward head, and decreased lumbar lordosis  PALPATION: Mild tenderness in the left thigh and hip, buttock  LOWER EXTREMITY ROM:  Active ROM Right eval Left eval  Hip flexion  45  Hip extension    Hip abduction  10  Hip adduction    Hip internal rotation    Hip external rotation    Knee flexion    Knee extension    Ankle dorsiflexion    Ankle plantarflexion    Ankle inversion    Ankle eversion     (Blank rows = not tested)  LOWER EXTREMITY MMT:  MMT Right eval Left eval  Hip flexion  3+  Hip extension    Hip abduction  3+  Hip adduction    Hip internal rotation    Hip external rotation    Knee flexion  4-  Knee extension  4-  Ankle dorsiflexion    Ankle plantarflexion    Ankle inversion    Ankle eversion     (Blank rows = not tested)  FUNCTIONAL TESTS:  5 times sit to stand: unable to do without hands, had to use the arm rests and could only  complete 3 due to pain and fatigue took 32 seconds to do 3 of the 5 Timed  up and go (TUG): 32 seconds with FWW.  02/11/24 20 seconds with SPC 3 minute walk test: 200 feet with the FWW  GAIT: Distance walked: 200 feet Assistive device utilized: Walker - 2 wheeled Level of assistance: SBA Comments: slow, left toe slightly turns in                                                                                                                                TREATMENT DATE:  02/16/24 Gait outside 1/2 way around the back parking [place] with 2 rest breaks standing CGA for curbs Hs curls 35# 3x10 Leg ext 5# 3x10 Nustep level 5 x 6 minutes 2.5# MArches 2.5# hip abduction Sit to stand no hands with a raised surface  02/11/24 Nustep level 5 x 7 minutes Gait outside with SPC around a/2 the parking [place], CGA for curbs and 2 LOB but able to self right, then 2 rest breaks due to fatigue LEg curls 25# 2 x12 LEg ext 5# 2x12 TUG 20 seconds with SPC 2.5# marches, abduction and extension 2x10 each Feet on ball K2C, rotation, small bridge, isometric abs Blue tband clamshells Ball b/n knees squeeze  02/09/24 Nustep level 5 x 7 minutes 2.5# Marches 2.5# hip abduction 2.5# hip extension Calf raises all above with the FWW. LEg curls 35# 3x10 5# leg extension 2x10 40# resisted gait forward and backward, with CGA and cues Feet on ball K2C, rotation, bridge, isometric abs Ball b/n knees squeeze Blue tband clamshells  02/04/24 Nustep level 5 x 7 minutes Leg curls 25# 3x10 Leg extension 5# 3x10 Sit to stand with weighted ball 2.5# marches 2.5# hip abduction Ball b/n knees squeeze Red tband clamshells Standing ball toss and then on airex with CGA Walking without device cues for speed and step length Backward walking  02/02/24 Nustep level 5 x 7 minutes 25# HS curls 3x10 5# Leg ext 2x10 Slant board stretch On airex ball toss Ball b/n knees squeeze Standing with FWW marches and  abduction Feet on ball K2C, rotation, small bridge and isometric abs 2x10 for each Blue tband clamshells Sliding board with cloth 2x10 SLR 2x5 Right sidelying clamshells STS from elevated seating surface 2 x5  01/28/24 Nustep level 5 x 6 minutes 25# HS curls 3 x10 Standing marches 2.5# LAQ 2x10 Blue tband clamshells Ball b/n knees squeeze Tried active hip ER, very poor strength, I supported leg and had him bring left foot to his right shin, but really could not perform the motion Supine bridges that I had him add to his HEP Supine heel slide with use of sliding board  01/26/24 Evaluation    PATIENT EDUCATION:  Education details: POC/Reviewed HEP Person educated: Patient Education method: Explanation Education comprehension: verbalized understanding  HOME EXERCISE PROGRAM: From hospital   ASSESSMENT:  CLINICAL IMPRESSION: Patient continues to struggle with fatigue, we did look at his bloodwork his hemoglobin is very low, we talked about how to raise  this with green leafy foods, iron supplement, b-12 supplement and Vitamin C supplement.  Patient is a 89 y.o. male who was seen today for physical therapy evaluation and treatment for s/p ;eft THA posterior approach.   He was having significant pain prior to the surgery so he had been limited with his ability.  He reports that for the most part he is without pain and only takes Tylenol .  He really struggles to get up from sitting, could not complete the 5XSTS test due to difficulty and pain.    OBJECTIVE IMPAIRMENTS: Abnormal gait, cardiopulmonary status limiting activity, decreased activity tolerance, decreased balance, decreased endurance, decreased mobility, difficulty walking, decreased ROM, decreased strength, increased edema, increased muscle spasms, impaired flexibility, postural dysfunction, and pain.   ACTIVITY LIMITATIONS: carrying, lifting, sitting, standing, squatting, sleeping, stairs, transfers, bed mobility, bathing,  and locomotion level  PARTICIPATION LIMITATIONS: cleaning, laundry, driving, shopping, community activity, and yard work  PERSONAL FACTORS: Age, Fitness, Past/current experiences, 1 comorbidity:  , and 3+ comorbidities: as above are also affecting patient's functional outcome.   REHAB POTENTIAL: Good  CLINICAL DECISION MAKING: Evolving/moderate complexity  EVALUATION COMPLEXITY: Moderate   GOALS: Goals reviewed with patient? Yes  SHORT TERM GOALS: Target date: 02/25/24 Independent with initial HEP Baseline: Goal status:met 02/11/24   LONG TERM GOALS: Target date: 04/26/24  Independent with advanced HEP Baseline:  Goal status: INITIAL  2.  Go up and down stairs step over step Baseline:  Goal status: INITIAL  3.  Get up from sitting without hands Baseline:  Goal status: INITIAL  4.  Decrease TUG time to 15 seconds Baseline: 32 seconds Goal status: progressing 20 seconds 02/11/24  5.  Walk without device or SPC for all distances Baseline:  Goal status: INITIAL  6.  Walk 400 feet for Baseline:  Goal status: INITIAL   PLAN:  PT FREQUENCY: 2x/week  PT DURATION: 12 weeks  PLANNED INTERVENTIONS: 97164- PT Re-evaluation, 97110-Therapeutic exercises, 97530- Therapeutic activity, 97112- Neuromuscular re-education, 97535- Self Care, [id number]- Manual therapy, [phone]- Gait training, Patient/Family education, Balance training, Stair training, and Cryotherapy  PLAN FOR NEXT SESSION: advance walking, endurance, strength and function,    Sherylann Vangorden W, PT 02/16/2024, 10:58 AM

## 2024-02-18 ENCOUNTER — Ambulatory Visit: Admitting: Physical Therapy

## 2024-02-18 ENCOUNTER — Encounter: Payer: Self-pay | Admitting: Physical Therapy

## 2024-02-18 DIAGNOSIS — M25552 Pain in left hip: Secondary | ICD-10-CM

## 2024-02-18 DIAGNOSIS — R2689 Other abnormalities of gait and mobility: Secondary | ICD-10-CM

## 2024-02-18 DIAGNOSIS — R252 Cramp and spasm: Secondary | ICD-10-CM | POA: Diagnosis not present

## 2024-02-18 DIAGNOSIS — M6281 Muscle weakness (generalized): Secondary | ICD-10-CM | POA: Diagnosis not present

## 2024-02-18 DIAGNOSIS — R262 Difficulty in walking, not elsewhere classified: Secondary | ICD-10-CM | POA: Diagnosis not present

## 2024-02-18 DIAGNOSIS — R2681 Unsteadiness on feet: Secondary | ICD-10-CM

## 2024-02-18 NOTE — Therapy (Signed)
 OUTPATIENT PHYSICAL THERAPY LOWER EXTREMITY TREATMENT   Patient Name: Connor [name] MRN: [id number] DOB:February 20, 1934, 89 y.o., male Today's Date: 02/18/2024  END OF SESSION:  PT End of Session - 02/18/24 1051     Visit Number 8    Number of Visits 24    Date for Recertification  04/26/24    Authorization Type [employer]    PT Start Time 1051    PT Stop Time 1140    PT Time Calculation (min) 49 min    Activity Tolerance Patient limited by fatigue;Patient tolerated treatment well    Behavior During Therapy [facility] for tasks assessed/performed          Past Medical History:  Diagnosis Date   Allergy 1985   Anemia    Benign localized prostatic hyperplasia with lower urinary tract symptoms (LUTS)    urologist-- dr matilda   Cancer [facility]) 07/2018   prostate   Cataract 12/2012   CHF (congestive heart failure) (HCC) ?   Chronic anticoagulation    Xarelto  for afib   Chronic constipation    Chronic cystitis    Chronic kidney disease    Clotting disorder    GERD (gastroesophageal reflux disease)    Gross hematuria    Heart murmur ?   History of DVT of lower extremity 06/08/2010   right femoral dvt dx post op total hip 05-08-2010   History of gastric ulcer    remote   History of kidney stones    one stone. remote hx   Hypercholesteremia    Hyperlipidemia ?   Hypertension    Dx by Dr. Salena Skill around age  48    Macular degeneration    dry in both eyes,  no injections  sees Dr. Alvia   NICM (nonischemic cardiomyopathy) [facility])    per echo 06-11-2017,  ef 45-50% with diffuse hypokinesis,  G1DD   OSA on CPAP 2017   compliant with CPAP.  managed by Dr Jude.   Osteoarthritis    Other urethral stricture, male, meatal    chronic;  pt self dilates   Paroxysmal atrial fibrillation [facility]) cardiologist-- dr kelsie   first dx 2009/   a. documented by Dr Comer on office EKG 9/12. b. maintained on Tikosyn  and Xarelto . c. s/p DCCV 's.  d.  x3  EP w/ ablation atrial fib last one  12-05-2016   Pre-diabetes    Premature ventricular contraction    Presence of Watchman left atrial appendage closure device 05/30/2022   Watchman FLX 24mm by Dr. Cindie   PVC's (premature ventricular contractions)    RA (rheumatoid arthritis) [facility])    rheumatologist--- Dr LORELEI Jacob,  treated with Humira   RBBB (right bundle branch block)    Hx of a fib, ablation Aug 2018   PVCs,   Pt had Watchman placed 05-30-2022   Rheumatoid arthritis (HCC)    Sleep apnea    Ulcer    Vitamin D  deficiency    Wears hearing aid in both ears    Past Surgical History:  Procedure Laterality Date   ABLATION OF DYSRHYTHMIC FOCUS  08/29/2015   ATRIAL FIBRILLATION ABLATION  12/05/2016   ATRIAL FIBRILLATION ABLATION N/A 12/05/2016   Procedure: Atrial Fibrillation Ablation;  Surgeon: kelsie Lynwood, MD;  Location: [facility] INVASIVE CV LAB;  Service: Cardiovascular;  Laterality: N/A;   ATRIAL FIBRILLATION ABLATION N/A 02/29/2020   Procedure: ATRIAL FIBRILLATION ABLATION;  Surgeon: kelsie Lynwood, MD;  Location: [facility] INVASIVE CV LAB;  Service: Cardiovascular;  Laterality: N/A;  ATRIAL FLUTTER ABLATION  09/2010   by MILUS EDWARDS Right 1990s   CARDIOVERSION N/A 10/28/2012   Procedure: CARDIOVERSION;  Surgeon: Lonni JONETTA Cash, MD;  Location: [facility] OR;  Service: Cardiovascular;  Laterality: N/A;   CATARACT EXTRACTION W/ INTRAOCULAR LENS  IMPLANT, BILATERAL  2019   CYSTOSCOPY WITH BIOPSY N/A 09/03/2018   Procedure: CYSTOSCOPY WITH FULGURATION CELESTA;  Surgeon: Matilda Senior, MD;  Location: [facility] ORS;  Service: Urology;  Laterality: N/A;  30 MINS   CYSTOSCOPY WITH FULGERATION N/A 01/23/2022   Procedure: CYSTOSCOPY WITH FULGERATION OF PROSTATIC URETHRA;  Surgeon: Matilda Senior, MD;  Location: [facility] ORS;  Service: Urology;  Laterality: N/A;  30 MINS   DENTAL SURGERY     ELECTROPHYSIOLOGIC STUDY N/A 08/29/2015   Procedure: Atrial Fibrillation Ablation;  Surgeon: Lynwood Rakers, MD;  Location: [facility] INVASIVE CV LAB;   Service: Cardiovascular;  Laterality: N/A;   EYE SURGERY     Cataracts   JOINT REPLACEMENT  1995, 1997   KNEE SURGERY Bilateral x3   1960s & 1970s   open   LEFT ATRIAL APPENDAGE OCCLUSION N/A 05/30/2022   Procedure: LEFT ATRIAL APPENDAGE OCCLUSION;  Surgeon: Cindie Ole DASEN, MD;  Location: [facility] INVASIVE CV LAB;  Service: Cardiovascular;  Laterality: N/A;   LEFT HEART CATHETERIZATION WITH CORONARY ANGIOGRAM N/A 06/07/2011   Procedure: LEFT HEART CATHETERIZATION WITH CORONARY ANGIOGRAM;  Surgeon: Peter M Swaziland, MD;  Location: [facility] CATH LAB;  Service: Cardiovascular;  Laterality: N/A;    Normal coronary arteries,  low normal LVF   TEE WITHOUT CARDIOVERSION N/A 05/30/2022   Procedure: TRANSESOPHAGEAL ECHOCARDIOGRAM (TEE);  Surgeon: Cindie Ole DASEN, MD;  Location: [facility] INVASIVE CV LAB;  Service: Cardiovascular;  Laterality: N/A;   TONSILLECTOMY AND ADENOIDECTOMY  age 44   TOTAL HIP ARTHROPLASTY Right 06/04/10    dr jane  @[facility]    TOTAL HIP ARTHROPLASTY Left 01/07/2024   Procedure: ARTHROPLASTY, HIP, TOTAL,POSTERIOR APPROACH;  Surgeon: Edna Toribio LABOR, MD;  Location: [facility] ORS;  Service: Orthopedics;  Laterality: Left;   TOTAL KNEE ARTHROPLASTY Bilateral right 1995;  left 1997   TRANSURETHRAL RESECTION OF PROSTATE  06-29-2002   dr humphries  @[facility]    TRANSURETHRAL RESECTION OF PROSTATE N/A 07/06/2018   Procedure: TRANSURETHRAL RESECTION OF THE PROSTATE (TURP);  Surgeon: Matilda Senior, MD;  Location: [facility];  Service: Urology;  Laterality: N/A;  45 MINS   Patient Active Problem List   Diagnosis Date Noted   Primary osteoarthritis of left hip 01/07/2024   Pain due to onychomycosis of toenails of both feet 07/03/2023   Atrial fibrillation (HCC) 05/30/2022   Presence of Watchman left atrial appendage closure device 05/30/2022   Hematuria 01/04/2022   Gross hematuria 01/04/2022   Secondary hypercoagulable state 03/28/2020   Malignant neoplasm of prostate (HCC) 09/11/2018    Enlarged prostate with urinary obstruction 07/06/2018   Gastrocnemius strain, right, initial encounter 12/10/2017   Baker's cyst of knee, right 12/10/2017   Obesity 03/24/2017   Paroxysmal atrial fibrillation (HCC) 12/05/2016   Near syncope 04/20/2016   Leg hematoma, left, initial encounter 04/20/2016   Bleeding    Ecchymosis    Left hamstring muscle strain    Muscle tear    RBBB 08/30/2015   Chronic systolic dysfunction of left ventricle 07/11/2014   OSA (obstructive sleep apnea) 04/07/2013   Multinodular goiter 01/20/2013   Cervical spondylosis without myelopathy 01/18/2013   Left ventricular dysfunction 12/17/2011   Low back pain 09/11/2011   Fatigue 05/22/2011   PVC (premature ventricular  contraction) 01/18/2011   Persistent atrial fibrillation (HCC) 01/12/2011   Hyperlipidemia 08/02/2010   Hypertension 08/02/2010   Osteoarthritis 08/02/2010   BPH (benign prostatic hyperplasia) 08/02/2010   GERD (gastroesophageal reflux disease) 08/02/2010   h/o Atrial flutter (HCC) s/p ablation 2012     PCP: Copland, MD  REFERRING PROVIDER: Edna, MD  REFERRING DIAG: S/P Left THA posterior  THERAPY DIAG:  Pain in left hip  Muscle weakness (generalized)  Unsteadiness on feet  Difficulty in walking, not elsewhere classified  Other abnormalities of gait and mobility  Cramp and spasm  Rationale for Evaluation and Treatment: Rehabilitation  ONSET DATE: 01/07/24  SUBJECTIVE:   SUBJECTIVE STATEMENT: Doing okay, I was not too tired after the last visit   Patient underwent left posterior THA on 01/07/24, he has had Home PT until last week.  He reports that he has very little pain, just fatigue.  He reports that he has been doing the HEP given from the hospital.    PERTINENT HISTORY: A fib, prostate CA, LBP. HLD, HTN, OA, RA, bilateral TKA PAIN:  Are you having pain? Yes: NPRS scale: 0/10 Pain location: left hip/back/thigh Pain description: spasm Aggravating factors:  getting up from the chair pain up to 5/10 Relieving factors: rest, Tylenol , can have no pain  PRECAUTIONS: Posterior hip  RED FLAGS: None   WEIGHT BEARING RESTRICTIONS: No  FALLS:  Has patient fallen in last 6 months? No  LIVING ENVIRONMENT: Lives with: lives with their family and lives with their spouse Lives in: House/apartment Stairs: Yes: Internal: 14 steps; can reach both Has following equipment at home: Vannie - 2 wheeled  OCCUPATION: retired  PLOF: Independent and shopping, no device, some yard work  PATIENT GOALS: I want to walk without any device, I want to be able to do yard work  NEXT MD VISIT: October 16  OBJECTIVE:  Note: Objective measures were completed at Evaluation unless otherwise noted.  DIAGNOSTIC FINDINGS: N/A   COGNITION: Overall cognitive status: Within functional limits for tasks assessed     SENSATION: WFL  EDEMA:  Has visible edema in the left thigh and knee  MUSCLE LENGTH: Tight calves and HS  POSTURE: rounded shoulders, forward head, and decreased lumbar lordosis  PALPATION: Mild tenderness in the left thigh and hip, buttock  LOWER EXTREMITY ROM:  Active ROM Right eval Left eval  Hip flexion  45  Hip extension    Hip abduction  10  Hip adduction    Hip internal rotation    Hip external rotation    Knee flexion    Knee extension    Ankle dorsiflexion    Ankle plantarflexion    Ankle inversion    Ankle eversion     (Blank rows = not tested)  LOWER EXTREMITY MMT:  MMT Right eval Left eval  Hip flexion  3+  Hip extension    Hip abduction  3+  Hip adduction    Hip internal rotation    Hip external rotation    Knee flexion  4-  Knee extension  4-  Ankle dorsiflexion    Ankle plantarflexion    Ankle inversion    Ankle eversion     (Blank rows = not tested)  FUNCTIONAL TESTS:  5 times sit to stand: unable to do without hands, had to use the arm rests and could only complete 3 due to pain and fatigue took 32  seconds to do 3 of the 5 Timed up and go (TUG): 32 seconds with FWW.  02/11/24 20 seconds with SPC 3 minute walk test: 200 feet with the FWW  GAIT: Distance walked: 200 feet Assistive device utilized: Walker - 2 wheeled Level of assistance: SBA Comments: slow, left toe slightly turns in                                                                                                                                TREATMENT DATE:  02/18/24 Leg curls 25# 3x10 Leg ext 5# 3x10 Gait outside 1/2 parking [place] with 1 rest break 2.5# MArches 2.5# abduction Side stepping backward walking TUG without device 16 seconds   02/16/24 Gait outside 1/2 way around the back parking [place] with 2 rest breaks standing CGA for curbs Hs curls 35# 3x10 Leg ext 5# 3x10 Nustep level 5 x 6 minutes 2.5# MArches 2.5# hip abduction Sit to stand no hands with a raised surface  02/11/24 Nustep level 5 x 7 minutes Gait outside with SPC around a/2 the parking [place], CGA for curbs and 2 LOB but able to self right, then 2 rest breaks due to fatigue LEg curls 25# 2 x12 LEg ext 5# 2x12 TUG 20 seconds with SPC 2.5# marches, abduction and extension 2x10 each Feet on ball K2C, rotation, small bridge, isometric abs Blue tband clamshells Ball b/n knees squeeze  02/09/24 Nustep level 5 x 7 minutes 2.5# Marches 2.5# hip abduction 2.5# hip extension Calf raises all above with the FWW. LEg curls 35# 3x10 5# leg extension 2x10 40# resisted gait forward and backward, with CGA and cues Feet on ball K2C, rotation, bridge, isometric abs Ball b/n knees squeeze Blue tband clamshells  02/04/24 Nustep level 5 x 7 minutes Leg curls 25# 3x10 Leg extension 5# 3x10 Sit to stand with weighted ball 2.5# marches 2.5# hip abduction Ball b/n knees squeeze Red tband clamshells Standing ball toss and then on airex with CGA Walking without device cues for speed and step length Backward walking  02/02/24 Nustep level 5  x 7 minutes 25# HS curls 3x10 5# Leg ext 2x10 Slant board stretch On airex ball toss Ball b/n knees squeeze Standing with FWW marches and abduction Feet on ball K2C, rotation, small bridge and isometric abs 2x10 for each Blue tband clamshells Sliding board with cloth 2x10 SLR 2x5 Right sidelying clamshells STS from elevated seating surface 2 x5  01/28/24 Nustep level 5 x 6 minutes 25# HS curls 3 x10 Standing marches 2.5# LAQ 2x10 Blue tband clamshells Ball b/n knees squeeze Tried active hip ER, very poor strength, I supported leg and had him bring left foot to his right shin, but really could not perform the motion Supine bridges that I had him add to his HEP Supine heel slide with use of sliding board  01/26/24 Evaluation    PATIENT EDUCATION:  Education details: POC/Reviewed HEP Person educated: Patient Education method: Explanation Education comprehension: verbalized understanding  HOME EXERCISE PROGRAM: From hospital   ASSESSMENT:  CLINICAL  IMPRESSION: Patient only needed one rest break with the walk today but did c/o the fatigue with breathing and in his leg mms.  He still struggles to get up from seating surfaces.  WE did the TUG today with no device and he was substantially faster.  Patient is a 89 y.o. male who was seen today for physical therapy evaluation and treatment for s/p ;eft THA posterior approach.   He was having significant pain prior to the surgery so he had been limited with his ability.  He reports that for the most part he is without pain and only takes Tylenol .  He really struggles to get up from sitting, could not complete the 5XSTS test due to difficulty and pain.    OBJECTIVE IMPAIRMENTS: Abnormal gait, cardiopulmonary status limiting activity, decreased activity tolerance, decreased balance, decreased endurance, decreased mobility, difficulty walking, decreased ROM, decreased strength, increased edema, increased muscle spasms, impaired  flexibility, postural dysfunction, and pain.   ACTIVITY LIMITATIONS: carrying, lifting, sitting, standing, squatting, sleeping, stairs, transfers, bed mobility, bathing, and locomotion level  PARTICIPATION LIMITATIONS: cleaning, laundry, driving, shopping, community activity, and yard work  PERSONAL FACTORS: Age, Fitness, Past/current experiences, 1 comorbidity:  , and 3+ comorbidities: as above are also affecting patient's functional outcome.   REHAB POTENTIAL: Good  CLINICAL DECISION MAKING: Evolving/moderate complexity  EVALUATION COMPLEXITY: Moderate   GOALS: Goals reviewed with patient? Yes  SHORT TERM GOALS: Target date: 02/25/24 Independent with initial HEP Baseline: Goal status:met 02/11/24   LONG TERM GOALS: Target date: 04/26/24  Independent with advanced HEP Baseline:  Goal status: INITIAL  2.  Go up and down stairs step over step Baseline:  Goal status: ongoing 02/18/24  3.  Get up from sitting without hands Baseline:  Goal status: ongoing 02/18/24  4.  Decrease TUG time to 15 seconds Baseline: 32 seconds Goal status: progressing 20 seconds 02/11/24  5.  Walk without device or SPC for all distances Baseline:  Goal status: INITIAL  6.  Walk 400 feet for Baseline:  Goal status: INITIAL   PLAN:  PT FREQUENCY: 2x/week  PT DURATION: 12 weeks  PLANNED INTERVENTIONS: [id number]- PT Re-evaluation, 97110-Therapeutic exercises, 97530- Therapeutic activity, 97112- Neuromuscular re-education, 97535- Self Care, [id number]- Manual therapy, [phone]- Gait training, Patient/Family education, Balance training, Stair training, and Cryotherapy  PLAN FOR NEXT SESSION: advance walking, endurance, strength and function,    Arisbeth Purrington W, PT 02/18/2024, 10:53 AM

## 2024-02-19 DIAGNOSIS — Z4789 Encounter for other orthopedic aftercare: Secondary | ICD-10-CM | POA: Diagnosis not present

## 2024-02-19 DIAGNOSIS — Z96642 Presence of left artificial hip joint: Secondary | ICD-10-CM | POA: Diagnosis not present

## 2024-02-23 ENCOUNTER — Ambulatory Visit: Admitting: Physical Therapy

## 2024-02-23 ENCOUNTER — Encounter: Payer: Self-pay | Admitting: Physical Therapy

## 2024-02-23 DIAGNOSIS — M6281 Muscle weakness (generalized): Secondary | ICD-10-CM

## 2024-02-23 DIAGNOSIS — R2689 Other abnormalities of gait and mobility: Secondary | ICD-10-CM | POA: Diagnosis not present

## 2024-02-23 DIAGNOSIS — R252 Cramp and spasm: Secondary | ICD-10-CM | POA: Diagnosis not present

## 2024-02-23 DIAGNOSIS — R2681 Unsteadiness on feet: Secondary | ICD-10-CM | POA: Diagnosis not present

## 2024-02-23 DIAGNOSIS — R262 Difficulty in walking, not elsewhere classified: Secondary | ICD-10-CM

## 2024-02-23 DIAGNOSIS — M25552 Pain in left hip: Secondary | ICD-10-CM

## 2024-02-23 NOTE — Therapy (Signed)
 OUTPATIENT PHYSICAL THERAPY LOWER EXTREMITY TREATMENT   Patient Name: Connor [name] MRN: [id number] DOB:Dec 31, 1933, 89 y.o., male Today's Date: 02/23/2024  END OF SESSION:  PT End of Session - 02/23/24 1052     Visit Number 9    Number of Visits 24    Date for Recertification  04/26/24    Authorization Type [employer]    Activity Tolerance Patient limited by fatigue;Patient tolerated treatment well    Behavior During Therapy [facility] for tasks assessed/performed          Past Medical History:  Diagnosis Date   Allergy 1985   Anemia    Benign localized prostatic hyperplasia with lower urinary tract symptoms (LUTS)    urologist-- dr matilda   Cancer [facility]) 07/2018   prostate   Cataract 12/2012   CHF (congestive heart failure) (HCC) ?   Chronic anticoagulation    Xarelto  for afib   Chronic constipation    Chronic cystitis    Chronic kidney disease    Clotting disorder    GERD (gastroesophageal reflux disease)    Gross hematuria    Heart murmur ?   History of DVT of lower extremity 06/08/2010   right femoral dvt dx post op total hip 05-08-2010   History of gastric ulcer    remote   History of kidney stones    one stone. remote hx   Hypercholesteremia    Hyperlipidemia ?   Hypertension    Dx by Dr. Salena Skill around age  61    Macular degeneration    dry in both eyes,  no injections  sees Dr. Alvia   NICM (nonischemic cardiomyopathy) [facility])    per echo 06-11-2017,  ef 45-50% with diffuse hypokinesis,  G1DD   OSA on CPAP 2017   compliant with CPAP.  managed by Dr Jude.   Osteoarthritis    Other urethral stricture, male, meatal    chronic;  pt self dilates   Paroxysmal atrial fibrillation [facility]) cardiologist-- dr kelsie   first dx 2009/   a. documented by Dr Comer on office EKG 9/12. b. maintained on Tikosyn  and Xarelto . c. s/p DCCV 's.  d.  x3  EP w/ ablation atrial fib last one 12-05-2016   Pre-diabetes    Premature ventricular contraction    Presence of  Watchman left atrial appendage closure device 05/30/2022   Watchman FLX 24mm by Dr. Cindie   PVC's (premature ventricular contractions)    RA (rheumatoid arthritis) [facility])    rheumatologist--- Dr LORELEI Jacob,  treated with Humira   RBBB (right bundle branch block)    Hx of a fib, ablation Aug 2018   PVCs,   Pt had Watchman placed 05-30-2022   Rheumatoid arthritis (HCC)    Sleep apnea    Ulcer    Vitamin D  deficiency    Wears hearing aid in both ears    Past Surgical History:  Procedure Laterality Date   ABLATION OF DYSRHYTHMIC FOCUS  08/29/2015   ATRIAL FIBRILLATION ABLATION  12/05/2016   ATRIAL FIBRILLATION ABLATION N/A 12/05/2016   Procedure: Atrial Fibrillation Ablation;  Surgeon: kelsie Lynwood, MD;  Location: [facility] INVASIVE CV LAB;  Service: Cardiovascular;  Laterality: N/A;   ATRIAL FIBRILLATION ABLATION N/A 02/29/2020   Procedure: ATRIAL FIBRILLATION ABLATION;  Surgeon: kelsie Lynwood, MD;  Location: [facility] INVASIVE CV LAB;  Service: Cardiovascular;  Laterality: N/A;   ATRIAL FLUTTER ABLATION  09/2010   by MILUS EDWARDS Right 1990s   CARDIOVERSION N/A 10/28/2012  Procedure: CARDIOVERSION;  Surgeon: Lonni JONETTA Cash, MD;  Location: [facility] OR;  Service: Cardiovascular;  Laterality: N/A;   CATARACT EXTRACTION W/ INTRAOCULAR LENS  IMPLANT, BILATERAL  2019   CYSTOSCOPY WITH BIOPSY N/A 09/03/2018   Procedure: CYSTOSCOPY WITH FULGURATION CELESTA;  Surgeon: Matilda Senior, MD;  Location: [facility] ORS;  Service: Urology;  Laterality: N/A;  30 MINS   CYSTOSCOPY WITH FULGERATION N/A 01/23/2022   Procedure: CYSTOSCOPY WITH FULGERATION OF PROSTATIC URETHRA;  Surgeon: Matilda Senior, MD;  Location: [facility] ORS;  Service: Urology;  Laterality: N/A;  30 MINS   DENTAL SURGERY     ELECTROPHYSIOLOGIC STUDY N/A 08/29/2015   Procedure: Atrial Fibrillation Ablation;  Surgeon: Lynwood Rakers, MD;  Location: [facility] INVASIVE CV LAB;  Service: Cardiovascular;  Laterality: N/A;   EYE SURGERY     Cataracts    JOINT REPLACEMENT  1995, 1997   KNEE SURGERY Bilateral x3   1960s & 1970s   open   LEFT ATRIAL APPENDAGE OCCLUSION N/A 05/30/2022   Procedure: LEFT ATRIAL APPENDAGE OCCLUSION;  Surgeon: Cindie Ole DASEN, MD;  Location: [facility] INVASIVE CV LAB;  Service: Cardiovascular;  Laterality: N/A;   LEFT HEART CATHETERIZATION WITH CORONARY ANGIOGRAM N/A 06/07/2011   Procedure: LEFT HEART CATHETERIZATION WITH CORONARY ANGIOGRAM;  Surgeon: Peter M Swaziland, MD;  Location[facility] CATH LAB;  Service: Cardiovascular;  Laterality: N/A;    Normal coronary arteries,  low normal LVF   TEE WITHOUT CARDIOVERSION N/A 05/30/2022   Procedure: TRANSESOPHAGEAL ECHOCARDIOGRAM (TEE);  Surgeon: Cindie Ole DASEN, MD;  Location: [facility] INVASIVE CV LAB;  Service: Cardiovascular;  Laterality: N/A;   TONSILLECTOMY AND ADENOIDECTOMY  age 68   TOTAL HIP ARTHROPLASTY Right 06/04/10    dr jane  @[facility]    TOTAL HIP ARTHROPLASTY Left 01/07/2024   Procedure: ARTHROPLASTY, HIP, TOTAL,POSTERIOR APPROACH;  Surgeon: Edna Toribio LABOR, MD;  Location: [facility] ORS;  Service: Orthopedics;  Laterality: Left;   TOTAL KNEE ARTHROPLASTY Bilateral right 1995;  left 1997   TRANSURETHRAL RESECTION OF PROSTATE  06-29-2002   dr humphries  @[facility]    TRANSURETHRAL RESECTION OF PROSTATE N/A 07/06/2018   Procedure: TRANSURETHRAL RESECTION OF THE PROSTATE (TURP);  Surgeon: Matilda Senior, MD;  Location: [facility];  Service: Urology;  Laterality: N/A;  45 MINS   Patient Active Problem List   Diagnosis Date Noted   Primary osteoarthritis of left hip 01/07/2024   Pain due to onychomycosis of toenails of both feet 07/03/2023   Atrial fibrillation (HCC) 05/30/2022   Presence of Watchman left atrial appendage closure device 05/30/2022   Hematuria 01/04/2022   Gross hematuria 01/04/2022   Secondary hypercoagulable state 03/28/2020   Malignant neoplasm of prostate (HCC) 09/11/2018   Enlarged prostate with urinary obstruction 07/06/2018   Gastrocnemius  strain, right, initial encounter 12/10/2017   Baker's cyst of knee, right 12/10/2017   Obesity 03/24/2017   Paroxysmal atrial fibrillation (HCC) 12/05/2016   Near syncope 04/20/2016   Leg hematoma, left, initial encounter 04/20/2016   Bleeding    Ecchymosis    Left hamstring muscle strain    Muscle tear    RBBB 08/30/2015   Chronic systolic dysfunction of left ventricle 07/11/2014   OSA (obstructive sleep apnea) 04/07/2013   Multinodular goiter 01/20/2013   Cervical spondylosis without myelopathy 01/18/2013   Left ventricular dysfunction 12/17/2011   Low back pain 09/11/2011   Fatigue 05/22/2011   PVC (premature ventricular contraction) 01/18/2011   Persistent atrial fibrillation (HCC) 01/12/2011   Hyperlipidemia 08/02/2010   Hypertension 08/02/2010   Osteoarthritis 08/02/2010  BPH (benign prostatic hyperplasia) 08/02/2010   GERD (gastroesophageal reflux disease) 08/02/2010   h/o Atrial flutter (HCC) s/p ablation 2012     PCP: Copland, MD  REFERRING PROVIDER: Edna, MD  REFERRING DIAG: S/P Left THA posterior  THERAPY DIAG:  Pain in left hip  Muscle weakness (generalized)  Unsteadiness on feet  Difficulty in walking, not elsewhere classified  Rationale for Evaluation and Treatment: Rehabilitation  ONSET DATE: 01/07/24  SUBJECTIVE:   SUBJECTIVE STATEMENT: I just get tired easy, saw surgeon last week, thinks everything is looking great.   Patient underwent left posterior THA on 01/07/24, he has had Home PT until last week.  He reports that he has very little pain, just fatigue.  He reports that he has been doing the HEP given from the hospital.    PERTINENT HISTORY: A fib, prostate CA, LBP. HLD, HTN, OA, RA, bilateral TKA PAIN:  Are you having pain? Yes: NPRS scale: 0/10 Pain location: left hip/back/thigh Pain description: spasm Aggravating factors: getting up from the chair pain up to 5/10 Relieving factors: rest, Tylenol , can have no pain  PRECAUTIONS:  Posterior hip  RED FLAGS: None   WEIGHT BEARING RESTRICTIONS: No  FALLS:  Has patient fallen in last 6 months? No  LIVING ENVIRONMENT: Lives with: lives with their family and lives with their spouse Lives in: House/apartment Stairs: Yes: Internal: 14 steps; can reach both Has following equipment at home: Vannie - 2 wheeled  OCCUPATION: retired  PLOF: Independent and shopping, no device, some yard work  PATIENT GOALS: I want to walk without any device, I want to be able to do yard work  NEXT MD VISIT: October 16  OBJECTIVE:  Note: Objective measures were completed at Evaluation unless otherwise noted.  DIAGNOSTIC FINDINGS: N/A   COGNITION: Overall cognitive status: Within functional limits for tasks assessed     SENSATION: WFL  EDEMA:  Has visible edema in the left thigh and knee  MUSCLE LENGTH: Tight calves and HS  POSTURE: rounded shoulders, forward head, and decreased lumbar lordosis  PALPATION: Mild tenderness in the left thigh and hip, buttock  LOWER EXTREMITY ROM:  Active ROM Right eval Left eval  Hip flexion  45  Hip extension    Hip abduction  10  Hip adduction    Hip internal rotation    Hip external rotation    Knee flexion    Knee extension    Ankle dorsiflexion    Ankle plantarflexion    Ankle inversion    Ankle eversion     (Blank rows = not tested)  LOWER EXTREMITY MMT:  MMT Right eval Left eval  Hip flexion  3+  Hip extension    Hip abduction  3+  Hip adduction    Hip internal rotation    Hip external rotation    Knee flexion  4-  Knee extension  4-  Ankle dorsiflexion    Ankle plantarflexion    Ankle inversion    Ankle eversion     (Blank rows = not tested)  FUNCTIONAL TESTS:  5 times sit to stand: unable to do without hands, had to use the arm rests and could only complete 3 due to pain and fatigue took 32 seconds to do 3 of the 5 Timed up and go (TUG): 32 seconds with FWW.  02/11/24 20 seconds with SPC 3 minute  walk test: 200 feet with the FWW  GAIT: Distance walked: 200 feet Assistive device utilized: Walker - 2 wheeled Level of assistance:  SBA Comments: slow, left toe slightly turns in                                                                                                                                TREATMENT DATE:  02/23/24 Nustep level 5 x 6 minutes LEg curls 35# 3x10 Leg extension 5# 3x10 2.5# marches 2.5# extension Gait outside 1/2 way around the parking island short standing break and then through the grass to the front door On airex ball toss Side stepping over object Forward and back stepping over object Walking in clinic without device, forward, back ward and side stepping Education and discussion on multi tasking and how when he does this he tends to be off balance, gave instructions to stop walking prior to doing another task    02/18/24 Leg curls 25# 3x10 Leg ext 5# 3x10 Gait outside 1/2 parking [place] with 1 rest break 2.5# MArches 2.5# abduction Side stepping backward walking TUG without device 16 seconds   02/16/24 Gait outside 1/2 way around the back parking [place] with 2 rest breaks standing CGA for curbs Hs curls 35# 3x10 Leg ext 5# 3x10 Nustep level 5 x 6 minutes 2.5# MArches 2.5# hip abduction Sit to stand no hands with a raised surface  02/11/24 Nustep level 5 x 7 minutes Gait outside with SPC around a/2 the parking [place], CGA for curbs and 2 LOB but able to self right, then 2 rest breaks due to fatigue LEg curls 25# 2 x12 LEg ext 5# 2x12 TUG 20 seconds with SPC 2.5# marches, abduction and extension 2x10 each Feet on ball K2C, rotation, small bridge, isometric abs Blue tband clamshells Ball b/n knees squeeze  02/09/24 Nustep level 5 x 7 minutes 2.5# Marches 2.5# hip abduction 2.5# hip extension Calf raises all above with the FWW. LEg curls 35# 3x10 5# leg extension 2x10 40# resisted gait forward and backward, with CGA and  cues Feet on ball K2C, rotation, bridge, isometric abs Ball b/n knees squeeze Blue tband clamshells  02/04/24 Nustep level 5 x 7 minutes Leg curls 25# 3x10 Leg extension 5# 3x10 Sit to stand with weighted ball 2.5# marches 2.5# hip abduction Ball b/n knees squeeze Red tband clamshells Standing ball toss and then on airex with CGA Walking without device cues for speed and step length Backward walking  02/02/24 Nustep level 5 x 7 minutes 25# HS curls 3x10 5# Leg ext 2x10 Slant board stretch On airex ball toss Ball b/n knees squeeze Standing with FWW marches and abduction Feet on ball K2C, rotation, small bridge and isometric abs 2x10 for each Blue tband clamshells Sliding board with cloth 2x10 SLR 2x5 Right sidelying clamshells STS from elevated seating surface 2 x5  01/28/24 Nustep level 5 x 6 minutes 25# HS curls 3 x10 Standing marches 2.5# LAQ 2x10 Blue tband clamshells Ball b/n knees squeeze Tried active hip ER, very poor strength, I supported leg and had him bring  left foot to his right shin, but really could not perform the motion Supine bridges that I had him add to his HEP Supine heel slide with use of sliding board  01/26/24 Evaluation    PATIENT EDUCATION:  Education details: POC/Reviewed HEP Person educated: Patient Education method: Explanation Education comprehension: verbalized understanding  HOME EXERCISE PROGRAM: From hospital   ASSESSMENT:  CLINICAL IMPRESSION: Patient is doing well, seems to still have issues with fatigue and tires sooner than he would like, he does lose balance when he is multi tasking, like looking around our turning to talk while walking, tends to veer off and be unsafe, we discussed strategies to help with this, at this time I think he still requires the cane for all ambulation.  Patient is a 89 y.o. male who was seen today for physical therapy evaluation and treatment for s/p ;eft THA posterior approach.   He was having  significant pain prior to the surgery so he had been limited with his ability.  He reports that for the most part he is without pain and only takes Tylenol .  He really struggles to get up from sitting, could not complete the 5XSTS test due to difficulty and pain.    OBJECTIVE IMPAIRMENTS: Abnormal gait, cardiopulmonary status limiting activity, decreased activity tolerance, decreased balance, decreased endurance, decreased mobility, difficulty walking, decreased ROM, decreased strength, increased edema, increased muscle spasms, impaired flexibility, postural dysfunction, and pain.   ACTIVITY LIMITATIONS: carrying, lifting, sitting, standing, squatting, sleeping, stairs, transfers, bed mobility, bathing, and locomotion level  PARTICIPATION LIMITATIONS: cleaning, laundry, driving, shopping, community activity, and yard work  PERSONAL FACTORS: Age, Fitness, Past/current experiences, 1 comorbidity:  , and 3+ comorbidities: as above are also affecting patient's functional outcome.   REHAB POTENTIAL: Good  CLINICAL DECISION MAKING: Evolving/moderate complexity  EVALUATION COMPLEXITY: Moderate   GOALS: Goals reviewed with patient? Yes  SHORT TERM GOALS: Target date: 02/25/24 Independent with initial HEP Baseline: Goal status:met 02/11/24   LONG TERM GOALS: Target date: 04/26/24  Independent with advanced HEP Baseline:  Goal status: INITIAL  2.  Go up and down stairs step over step Baseline:  Goal status: ongoing 02/18/24  3.  Get up from sitting without hands Baseline:  Goal status: ongoing 02/18/24  4.  Decrease TUG time to 15 seconds Baseline: 32 seconds Goal status: progressing 20 seconds 02/11/24  5.  Walk without device or SPC for all distances Baseline:  Goal status: INITIAL  6.  Walk 400 feet for Baseline:  Goal status: INITIAL   PLAN:  PT FREQUENCY: 2x/week  PT DURATION: 12 weeks  PLANNED INTERVENTIONS: 97164- PT Re-evaluation, 97110-Therapeutic  exercises, 97530- Therapeutic activity, 97112- Neuromuscular re-education, 97535- Self Care, [id number]- Manual therapy, [phone]- Gait training, Patient/Family education, Balance training, Stair training, and Cryotherapy  PLAN FOR NEXT SESSION: advance walking, endurance, strength and function,    Breslin Burklow W, PT 02/23/2024, 10:52 AM

## 2024-02-25 ENCOUNTER — Encounter: Payer: Self-pay | Admitting: Physical Therapy

## 2024-02-25 ENCOUNTER — Ambulatory Visit: Admitting: Physical Therapy

## 2024-02-25 DIAGNOSIS — R2681 Unsteadiness on feet: Secondary | ICD-10-CM

## 2024-02-25 DIAGNOSIS — M6281 Muscle weakness (generalized): Secondary | ICD-10-CM | POA: Diagnosis not present

## 2024-02-25 DIAGNOSIS — R252 Cramp and spasm: Secondary | ICD-10-CM | POA: Diagnosis not present

## 2024-02-25 DIAGNOSIS — R262 Difficulty in walking, not elsewhere classified: Secondary | ICD-10-CM | POA: Diagnosis not present

## 2024-02-25 DIAGNOSIS — R2689 Other abnormalities of gait and mobility: Secondary | ICD-10-CM | POA: Diagnosis not present

## 2024-02-25 DIAGNOSIS — M25552 Pain in left hip: Secondary | ICD-10-CM

## 2024-02-25 NOTE — Therapy (Signed)
 OUTPATIENT PHYSICAL THERAPY LOWER EXTREMITY TREATMENT Progress Note Reporting Period 01/26/24 to 02/25/24  See note below for Objective Data and Assessment of Progress/Goals.      Patient Name: Connor [name] MRN: [id number] DOB:1934-03-21, 89 y.o., male Today's Date: 02/25/2024  END OF SESSION:  PT End of Session - 02/25/24 1055     Visit Number 10    Number of Visits 24    Date for Recertification  04/26/24    Authorization Type [employer]    PT Start Time 1053    PT Stop Time 1140    PT Time Calculation (min) 47 min    Activity Tolerance Patient limited by fatigue;Patient tolerated treatment well    Behavior During Therapy [facility] for tasks assessed/performed          Past Medical History:  Diagnosis Date   Allergy 1985   Anemia    Benign localized prostatic hyperplasia with lower urinary tract symptoms (LUTS)    urologist-- dr matilda   Cancer [facility]) 07/2018   prostate   Cataract 12/2012   CHF (congestive heart failure) (HCC) ?   Chronic anticoagulation    Xarelto  for afib   Chronic constipation    Chronic cystitis    Chronic kidney disease    Clotting disorder    GERD (gastroesophageal reflux disease)    Gross hematuria    Heart murmur ?   History of DVT of lower extremity 06/08/2010   right femoral dvt dx post op total hip 05-08-2010   History of gastric ulcer    remote   History of kidney stones    one stone. remote hx   Hypercholesteremia    Hyperlipidemia ?   Hypertension    Dx by Dr. Salena Skill around age  26    Macular degeneration    dry in both eyes,  no injections  sees Dr. Alvia   NICM (nonischemic cardiomyopathy) [facility])    per echo 06-11-2017,  ef 45-50% with diffuse hypokinesis,  G1DD   OSA on CPAP 2017   compliant with CPAP.  managed by Dr Jude.   Osteoarthritis    Other urethral stricture, male, meatal    chronic;  pt self dilates   Paroxysmal atrial fibrillation [facility]) cardiologist-- dr kelsie   first dx 2009/   a. documented by Dr  Comer on office EKG 9/12. b. maintained on Tikosyn  and Xarelto . c. s/p DCCV 's.  d.  x3  EP w/ ablation atrial fib last one 12-05-2016   Pre-diabetes    Premature ventricular contraction    Presence of Watchman left atrial appendage closure device 05/30/2022   Watchman FLX 24mm by Dr. Cindie   PVC's (premature ventricular contractions)    RA (rheumatoid arthritis) [facility])    rheumatologist--- Dr LORELEI Jacob,  treated with Humira   RBBB (right bundle branch block)    Hx of a fib, ablation Aug 2018   PVCs,   Pt had Watchman placed 05-30-2022   Rheumatoid arthritis (HCC)    Sleep apnea    Ulcer    Vitamin D  deficiency    Wears hearing aid in both ears    Past Surgical History:  Procedure Laterality Date   ABLATION OF DYSRHYTHMIC FOCUS  08/29/2015   ATRIAL FIBRILLATION ABLATION  12/05/2016   ATRIAL FIBRILLATION ABLATION N/A 12/05/2016   Procedure: Atrial Fibrillation Ablation;  Surgeon: kelsie Lynwood, MD;  Location: [facility] INVASIVE CV LAB;  Service: Cardiovascular;  Laterality: N/A;   ATRIAL FIBRILLATION ABLATION N/A 02/29/2020  Procedure: ATRIAL FIBRILLATION ABLATION;  Surgeon: Kelsie Agent, MD;  Location: [facility] INVASIVE CV LAB;  Service: Cardiovascular;  Laterality: N/A;   ATRIAL FLUTTER ABLATION  09/2010   by MILUS EDWARDS Right 1990s   CARDIOVERSION N/A 10/28/2012   Procedure: CARDIOVERSION;  Surgeon: Lonni JONETTA Cash, MD;  Location: [facility] OR;  Service: Cardiovascular;  Laterality: N/A;   CATARACT EXTRACTION W/ INTRAOCULAR LENS  IMPLANT, BILATERAL  2019   CYSTOSCOPY WITH BIOPSY N/A 09/03/2018   Procedure: CYSTOSCOPY WITH FULGURATION CELESTA;  Surgeon: Matilda Senior, MD;  Location: [facility] ORS;  Service: Urology;  Laterality: N/A;  30 MINS   CYSTOSCOPY WITH FULGERATION N/A 01/23/2022   Procedure: CYSTOSCOPY WITH FULGERATION OF PROSTATIC URETHRA;  Surgeon: Matilda Senior, MD;  Location: [facility] ORS;  Service: Urology;  Laterality: N/A;  30 MINS   DENTAL SURGERY     ELECTROPHYSIOLOGIC  STUDY N/A 08/29/2015   Procedure: Atrial Fibrillation Ablation;  Surgeon: Agent Kelsie, MD;  Location: [facility] INVASIVE CV LAB;  Service: Cardiovascular;  Laterality: N/A;   EYE SURGERY     Cataracts   JOINT REPLACEMENT  1995, 1997   KNEE SURGERY Bilateral x3   1960s & 1970s   open   LEFT ATRIAL APPENDAGE OCCLUSION N/A 05/30/2022   Procedure: LEFT ATRIAL APPENDAGE OCCLUSION;  Surgeon: Cindie Ole DASEN, MD;  Location: [facility] INVASIVE CV LAB;  Service: Cardiovascular;  Laterality: N/A;   LEFT HEART CATHETERIZATION WITH CORONARY ANGIOGRAM N/A 06/07/2011   Procedure: LEFT HEART CATHETERIZATION WITH CORONARY ANGIOGRAM;  Surgeon: Peter M Swaziland, MD;  Location: [facility] CATH LAB;  Service: Cardiovascular;  Laterality: N/A;    Normal coronary arteries,  low normal LVF   TEE WITHOUT CARDIOVERSION N/A 05/30/2022   Procedure: TRANSESOPHAGEAL ECHOCARDIOGRAM (TEE);  Surgeon: Cindie Ole DASEN, MD;  Location: [facility] INVASIVE CV LAB;  Service: Cardiovascular;  Laterality: N/A;   TONSILLECTOMY AND ADENOIDECTOMY  age 25   TOTAL HIP ARTHROPLASTY Right 06/04/10    dr jane  @[facility]    TOTAL HIP ARTHROPLASTY Left 01/07/2024   Procedure: ARTHROPLASTY, HIP, TOTAL,POSTERIOR APPROACH;  Surgeon: Edna Toribio LABOR, MD;  Location: [facility] ORS;  Service: Orthopedics;  Laterality: Left;   TOTAL KNEE ARTHROPLASTY Bilateral right 1995;  left 1997   TRANSURETHRAL RESECTION OF PROSTATE  06-29-2002   dr humphries  @[facility]    TRANSURETHRAL RESECTION OF PROSTATE N/A 07/06/2018   Procedure: TRANSURETHRAL RESECTION OF THE PROSTATE (TURP);  Surgeon: Matilda Senior, MD;  Location: [facility];  Service: Urology;  Laterality: N/A;  45 MINS   Patient Active Problem List   Diagnosis Date Noted   Primary osteoarthritis of left hip 01/07/2024   Pain due to onychomycosis of toenails of both feet 07/03/2023   Atrial fibrillation (HCC) 05/30/2022   Presence of Watchman left atrial appendage closure device 05/30/2022   Hematuria 01/04/2022    Gross hematuria 01/04/2022   Secondary hypercoagulable state 03/28/2020   Malignant neoplasm of prostate (HCC) 09/11/2018   Enlarged prostate with urinary obstruction 07/06/2018   Gastrocnemius strain, right, initial encounter 12/10/2017   Baker's cyst of knee, right 12/10/2017   Obesity 03/24/2017   Paroxysmal atrial fibrillation (HCC) 12/05/2016   Near syncope 04/20/2016   Leg hematoma, left, initial encounter 04/20/2016   Bleeding    Ecchymosis    Left hamstring muscle strain    Muscle tear    RBBB 08/30/2015   Chronic systolic dysfunction of left ventricle 07/11/2014   OSA (obstructive sleep apnea) 04/07/2013   Multinodular goiter 01/20/2013   Cervical spondylosis without  myelopathy 01/18/2013   Left ventricular dysfunction 12/17/2011   Low back pain 09/11/2011   Fatigue 05/22/2011   PVC (premature ventricular contraction) 01/18/2011   Persistent atrial fibrillation (HCC) 01/12/2011   Hyperlipidemia 08/02/2010   Hypertension 08/02/2010   Osteoarthritis 08/02/2010   BPH (benign prostatic hyperplasia) 08/02/2010   GERD (gastroesophageal reflux disease) 08/02/2010   h/o Atrial flutter (HCC) s/p ablation 2012     PCP: Copland, MD  REFERRING PROVIDER: Edna, MD  REFERRING DIAG: S/P Left THA posterior  THERAPY DIAG:  Pain in left hip  Muscle weakness (generalized)  Unsteadiness on feet  Difficulty in walking, not elsewhere classified  Other abnormalities of gait and mobility  Cramp and spasm  Rationale for Evaluation and Treatment: Rehabilitation  ONSET DATE: 01/07/24  SUBJECTIVE:   SUBJECTIVE STATEMENT: I was tired after last time, I went home and took a nap   Patient underwent left posterior THA on 01/07/24, he has had Home PT until last week.  He reports that he has very little pain, just fatigue.  He reports that he has been doing the HEP given from the hospital.    PERTINENT HISTORY: A fib, prostate CA, LBP. HLD, HTN, OA, RA, bilateral TKA PAIN:   Are you having pain? Yes: NPRS scale: 0/10 Pain location: left hip/back/thigh Pain description: spasm Aggravating factors: getting up from the chair pain up to 5/10 Relieving factors: rest, Tylenol , can have no pain  PRECAUTIONS: Posterior hip  RED FLAGS: None   WEIGHT BEARING RESTRICTIONS: No  FALLS:  Has patient fallen in last 6 months? No  LIVING ENVIRONMENT: Lives with: lives with their family and lives with their spouse Lives in: House/apartment Stairs: Yes: Internal: 14 steps; can reach both Has following equipment at home: Vannie - 2 wheeled  OCCUPATION: retired  PLOF: Independent and shopping, no device, some yard work  PATIENT GOALS: I want to walk without any device, I want to be able to do yard work  NEXT MD VISIT: October 16  OBJECTIVE:  Note: Objective measures were completed at Evaluation unless otherwise noted.  DIAGNOSTIC FINDINGS: N/A   COGNITION: Overall cognitive status: Within functional limits for tasks assessed     SENSATION: WFL  EDEMA:  Has visible edema in the left thigh and knee  MUSCLE LENGTH: Tight calves and HS  POSTURE: rounded shoulders, forward head, and decreased lumbar lordosis  PALPATION: Mild tenderness in the left thigh and hip, buttock  LOWER EXTREMITY ROM:  Active ROM Right eval Left eval  Hip flexion  45  Hip extension    Hip abduction  10  Hip adduction    Hip internal rotation    Hip external rotation    Knee flexion    Knee extension    Ankle dorsiflexion    Ankle plantarflexion    Ankle inversion    Ankle eversion     (Blank rows = not tested)  LOWER EXTREMITY MMT:  MMT Right eval Left eval  Hip flexion  3+  Hip extension    Hip abduction  3+  Hip adduction    Hip internal rotation    Hip external rotation    Knee flexion  4-  Knee extension  4-  Ankle dorsiflexion    Ankle plantarflexion    Ankle inversion    Ankle eversion     (Blank rows = not tested)  FUNCTIONAL TESTS:  5  times sit to stand: unable to do without hands, had to use the arm rests and could only  complete 3 due to pain and fatigue took 32 seconds to do 3 of the 5 Timed up and go (TUG): 32 seconds with FWW.  02/11/24 20 seconds with SPC 3 minute walk test: 200 feet with the FWW  GAIT: Distance walked: 200 feet Assistive device utilized: Walker - 2 wheeled Level of assistance: SBA Comments: slow, left toe slightly turns in                                                                                                                                TREATMENT DATE:  02/25/24 Nustep level 5 x 6 minutes Leg curls 35# 3x10 Leg extension 5# 3x10 Gait outside around the entire parking [place] with 2 rests 2.5# marches and abduction Feet on ball K2C, rotation, bridges and isometric abs Blue tband clamshells Ball b/n knees squeeze  02/23/24 Nustep level 5 x 6 minutes LEg curls 35# 3x10 Leg extension 5# 3x10 2.5# marches 2.5# extension Gait outside 1/2 way around the parking [place] short standing break and then through the grass to the front door On airex ball toss Side stepping over object Forward and back stepping over object Walking in clinic without device, forward, back ward and side stepping Education and discussion on multi tasking and how when he does this he tends to be off balance, gave instructions to stop walking prior to doing another task    02/18/24 Leg curls 25# 3x10 Leg ext 5# 3x10 Gait outside 1/2 parking [place] with 1 rest break 2.5# MArches 2.5# abduction Side stepping backward walking TUG without device 16 seconds   02/16/24 Gait outside 1/2 way around the back parking [place] with 2 rest breaks standing CGA for curbs Hs curls 35# 3x10 Leg ext 5# 3x10 Nustep level 5 x 6 minutes 2.5# MArches 2.5# hip abduction Sit to stand no hands with a raised surface  02/11/24 Nustep level 5 x 7 minutes Gait outside with SPC around a/2 the parking [place], CGA for curbs and  2 LOB but able to self right, then 2 rest breaks due to fatigue LEg curls 25# 2 x12 LEg ext 5# 2x12 TUG 20 seconds with SPC 2.5# marches, abduction and extension 2x10 each Feet on ball K2C, rotation, small bridge, isometric abs Blue tband clamshells Ball b/n knees squeeze  02/09/24 Nustep level 5 x 7 minutes 2.5# Marches 2.5# hip abduction 2.5# hip extension Calf raises all above with the FWW. LEg curls 35# 3x10 5# leg extension 2x10 40# resisted gait forward and backward, with CGA and cues Feet on ball K2C, rotation, bridge, isometric abs Ball b/n knees squeeze Blue tband clamshells  02/04/24 Nustep level 5 x 7 minutes Leg curls 25# 3x10 Leg extension 5# 3x10 Sit to stand with weighted ball 2.5# marches 2.5# hip abduction Ball b/n knees squeeze Red tband clamshells Standing ball toss and then on airex with CGA Walking without device cues for speed and step length Backward walking  02/02/24 Nustep  level 5 x 7 minutes 25# HS curls 3x10 5# Leg ext 2x10 Slant board stretch On airex ball toss Ball b/n knees squeeze Standing with FWW marches and abduction Feet on ball K2C, rotation, small bridge and isometric abs 2x10 for each Blue tband clamshells Sliding board with cloth 2x10 SLR 2x5 Right sidelying clamshells STS from elevated seating surface 2 x5  01/28/24 Nustep level 5 x 6 minutes 25# HS curls 3 x10 Standing marches 2.5# LAQ 2x10 Blue tband clamshells Ball b/n knees squeeze Tried active hip ER, very poor strength, I supported leg and had him bring left foot to his right shin, but really could not perform the motion Supine bridges that I had him add to his HEP Supine heel slide with use of sliding board  01/26/24 Evaluation    PATIENT EDUCATION:  Education details: POC/Reviewed HEP Person educated: Patient Education method: Explanation Education comprehension: verbalized understanding  HOME EXERCISE PROGRAM: From hospital   ASSESSMENT:  CLINICAL  IMPRESSION: Using [facility] for ambulation all times, except int he house will use furniture and walls, he reports fatigue is his biggest issue, however I have seen him be off balance a few times, he tends to do this with turning or when he first gets up, we did talk last time regarding multitasking and safety  Patient is a 89 y.o. male who was seen today for physical therapy evaluation and treatment for s/p ;eft THA posterior approach.   He was having significant pain prior to the surgery so he had been limited with his ability.  He reports that for the most part he is without pain and only takes Tylenol .  He really struggles to get up from sitting, could not complete the 5XSTS test due to difficulty and pain.    OBJECTIVE IMPAIRMENTS: Abnormal gait, cardiopulmonary status limiting activity, decreased activity tolerance, decreased balance, decreased endurance, decreased mobility, difficulty walking, decreased ROM, decreased strength, increased edema, increased muscle spasms, impaired flexibility, postural dysfunction, and pain.   ACTIVITY LIMITATIONS: carrying, lifting, sitting, standing, squatting, sleeping, stairs, transfers, bed mobility, bathing, and locomotion level  PARTICIPATION LIMITATIONS: cleaning, laundry, driving, shopping, community activity, and yard work  PERSONAL FACTORS: Age, Fitness, Past/current experiences, 1 comorbidity:  , and 3+ comorbidities: as above are also affecting patient's functional outcome.   REHAB POTENTIAL: Good  CLINICAL DECISION MAKING: Evolving/moderate complexity  EVALUATION COMPLEXITY: Moderate   GOALS: Goals reviewed with patient? Yes  SHORT TERM GOALS: Target date: 02/25/24 Independent with initial HEP Baseline: Goal status:met 02/11/24   LONG TERM GOALS: Target date: 04/26/24  Independent with advanced HEP Baseline:  Goal status: progressing 02/25/24  2.  Go up and down stairs step over step Baseline:  Goal status: ongoing 02/25/24  3.  Get  up from sitting without hands Baseline:  Goal status: progressing 02/25/24  4.  Decrease TUG time to 15 seconds Baseline: 32 seconds Goal status: progressing 20 seconds 02/11/24  5.  Walk without device or SPC for all distances Baseline:  Goal status: met 02/25/24  6.  Walk 400 feet for Baseline:  Goal status: INITIAL   PLAN:  PT FREQUENCY: 2x/week  PT DURATION: 12 weeks  PLANNED INTERVENTIONS: 97164- PT Re-evaluation, 97110-Therapeutic exercises, 97530- Therapeutic activity, 97112- Neuromuscular re-education, 97535- Self Care, [id number]- Manual therapy, [phone]- Gait training, Patient/Family education, Balance training, Stair training, and Cryotherapy  PLAN FOR NEXT SESSION: advance walking, endurance, strength and function,    Elva Breaker W, PT 02/25/2024, 10:56 AM

## 2024-03-01 ENCOUNTER — Encounter: Payer: Self-pay | Admitting: Physical Therapy

## 2024-03-01 ENCOUNTER — Ambulatory Visit: Admitting: Physical Therapy

## 2024-03-01 DIAGNOSIS — M25552 Pain in left hip: Secondary | ICD-10-CM

## 2024-03-01 DIAGNOSIS — M6281 Muscle weakness (generalized): Secondary | ICD-10-CM | POA: Diagnosis not present

## 2024-03-01 DIAGNOSIS — R2689 Other abnormalities of gait and mobility: Secondary | ICD-10-CM | POA: Diagnosis not present

## 2024-03-01 DIAGNOSIS — R262 Difficulty in walking, not elsewhere classified: Secondary | ICD-10-CM

## 2024-03-01 DIAGNOSIS — R2681 Unsteadiness on feet: Secondary | ICD-10-CM

## 2024-03-01 DIAGNOSIS — R252 Cramp and spasm: Secondary | ICD-10-CM | POA: Diagnosis not present

## 2024-03-01 NOTE — Therapy (Signed)
 OUTPATIENT PHYSICAL THERAPY LOWER EXTREMITY TREATMENT    Patient Name: Connor [name] MRN: [id number] DOB:11-23-1933, 89 y.o., male Today's Date: 03/01/2024  END OF SESSION:  PT End of Session - 03/01/24 1057     Visit Number 11    Number of Visits 24    Date for Recertification  04/26/24    Authorization Type [employer]    PT Start Time 1057    PT Stop Time 1143    PT Time Calculation (min) 46 min    Activity Tolerance Patient limited by fatigue;Patient tolerated treatment well    Behavior During Therapy [facility] for tasks assessed/performed          Past Medical History:  Diagnosis Date   Allergy 1985   Anemia    Benign localized prostatic hyperplasia with lower urinary tract symptoms (LUTS)    urologist-- dr matilda   Cancer [facility]) 07/2018   prostate   Cataract 12/2012   CHF (congestive heart failure) (HCC) ?   Chronic anticoagulation    Xarelto  for afib   Chronic constipation    Chronic cystitis    Chronic kidney disease    Clotting disorder    GERD (gastroesophageal reflux disease)    Gross hematuria    Heart murmur ?   History of DVT of lower extremity 06/08/2010   right femoral dvt dx post op total hip 05-08-2010   History of gastric ulcer    remote   History of kidney stones    one stone. remote hx   Hypercholesteremia    Hyperlipidemia ?   Hypertension    Dx by Dr. Salena Skill around age  89    Macular degeneration    dry in both eyes,  no injections  sees Dr. Alvia   NICM (nonischemic cardiomyopathy) [facility])    per echo 06-11-2017,  ef 45-50% with diffuse hypokinesis,  G1DD   OSA on CPAP 2017   compliant with CPAP.  managed by Dr Jude.   Osteoarthritis    Other urethral stricture, male, meatal    chronic;  pt self dilates   Paroxysmal atrial fibrillation [facility]) cardiologist-- dr kelsie   first dx 2009/   a. documented by Dr Comer on office EKG 9/12. b. maintained on Tikosyn  and Xarelto . c. s/p DCCV 's.  d.  x3  EP w/ ablation atrial fib last one  12-05-2016   Pre-diabetes    Premature ventricular contraction    Presence of Watchman left atrial appendage closure device 05/30/2022   Watchman FLX 24mm by Dr. Cindie   PVC's (premature ventricular contractions)    RA (rheumatoid arthritis) [facility])    rheumatologist--- Dr LORELEI Jacob,  treated with Humira   RBBB (right bundle branch block)    Hx of a fib, ablation Aug 2018   PVCs,   Pt had Watchman placed 05-30-2022   Rheumatoid arthritis (HCC)    Sleep apnea    Ulcer    Vitamin D  deficiency    Wears hearing aid in both ears    Past Surgical History:  Procedure Laterality Date   ABLATION OF DYSRHYTHMIC FOCUS  08/29/2015   ATRIAL FIBRILLATION ABLATION  12/05/2016   ATRIAL FIBRILLATION ABLATION N/A 12/05/2016   Procedure: Atrial Fibrillation Ablation;  Surgeon: Kelsie Lynwood, MD;  Location: [facility] INVASIVE CV LAB;  Service: Cardiovascular;  Laterality: N/A;   ATRIAL FIBRILLATION ABLATION N/A 02/29/2020   Procedure: ATRIAL FIBRILLATION ABLATION;  Surgeon: Kelsie Lynwood, MD;  Location: [facility] INVASIVE CV LAB;  Service: Cardiovascular;  Laterality:  N/A;   ATRIAL FLUTTER ABLATION  09/2010   by MILUS EDWARDS Right 1990s   CARDIOVERSION N/A 10/28/2012   Procedure: CARDIOVERSION;  Surgeon: Lonni JONETTA Cash, MD;  Location: [facility] OR;  Service: Cardiovascular;  Laterality: N/A;   CATARACT EXTRACTION W/ INTRAOCULAR LENS  IMPLANT, BILATERAL  2019   CYSTOSCOPY WITH BIOPSY N/A 09/03/2018   Procedure: CYSTOSCOPY WITH FULGURATION CELESTA;  Surgeon: Matilda Senior, MD;  Location: [facility] ORS;  Service: Urology;  Laterality: N/A;  30 MINS   CYSTOSCOPY WITH FULGERATION N/A 01/23/2022   Procedure: CYSTOSCOPY WITH FULGERATION OF PROSTATIC URETHRA;  Surgeon: Matilda Senior, MD;  Location: [facility] ORS;  Service: Urology;  Laterality: N/A;  30 MINS   DENTAL SURGERY     ELECTROPHYSIOLOGIC STUDY N/A 08/29/2015   Procedure: Atrial Fibrillation Ablation;  Surgeon: Lynwood Rakers, MD;  Location: [facility] INVASIVE CV LAB;   Service: Cardiovascular;  Laterality: N/A;   EYE SURGERY     Cataracts   JOINT REPLACEMENT  1995, 1997   KNEE SURGERY Bilateral x3   1960s & 1970s   open   LEFT ATRIAL APPENDAGE OCCLUSION N/A 05/30/2022   Procedure: LEFT ATRIAL APPENDAGE OCCLUSION;  Surgeon: Cindie Ole DASEN, MD;  Location: [facility] INVASIVE CV LAB;  Service: Cardiovascular;  Laterality: N/A;   LEFT HEART CATHETERIZATION WITH CORONARY ANGIOGRAM N/A 06/07/2011   Procedure: LEFT HEART CATHETERIZATION WITH CORONARY ANGIOGRAM;  Surgeon: Peter M Jordan, MD;  Location: [facility] CATH LAB;  Service: Cardiovascular;  Laterality: N/A;    Normal coronary arteries,  low normal LVF   TEE WITHOUT CARDIOVERSION N/A 05/30/2022   Procedure: TRANSESOPHAGEAL ECHOCARDIOGRAM (TEE);  Surgeon: Cindie Ole DASEN, MD;  Location: [facility] INVASIVE CV LAB;  Service: Cardiovascular;  Laterality: N/A;   TONSILLECTOMY AND ADENOIDECTOMY  age 64   TOTAL HIP ARTHROPLASTY Right 06/04/10    dr jane  @[facility]    TOTAL HIP ARTHROPLASTY Left 01/07/2024   Procedure: ARTHROPLASTY, HIP, TOTAL,POSTERIOR APPROACH;  Surgeon: Edna Toribio LABOR, MD;  Location: [facility] ORS;  Service: Orthopedics;  Laterality: Left;   TOTAL KNEE ARTHROPLASTY Bilateral right 1995;  left 1997   TRANSURETHRAL RESECTION OF PROSTATE  06-29-2002   dr humphries  @[facility]    TRANSURETHRAL RESECTION OF PROSTATE N/A 07/06/2018   Procedure: TRANSURETHRAL RESECTION OF THE PROSTATE (TURP);  Surgeon: Matilda Senior, MD;  Location: [facility];  Service: Urology;  Laterality: N/A;  45 MINS   Patient Active Problem List   Diagnosis Date Noted   Primary osteoarthritis of left hip 01/07/2024   Pain due to onychomycosis of toenails of both feet 07/03/2023   Atrial fibrillation (HCC) 05/30/2022   Presence of Watchman left atrial appendage closure device 05/30/2022   Hematuria 01/04/2022   Gross hematuria 01/04/2022   Secondary hypercoagulable state 03/28/2020   Malignant neoplasm of prostate (HCC) 09/11/2018    Enlarged prostate with urinary obstruction 07/06/2018   Gastrocnemius strain, right, initial encounter 12/10/2017   Baker's cyst of knee, right 12/10/2017   Obesity 03/24/2017   Paroxysmal atrial fibrillation (HCC) 12/05/2016   Near syncope 04/20/2016   Leg hematoma, left, initial encounter 04/20/2016   Bleeding    Ecchymosis    Left hamstring muscle strain    Muscle tear    RBBB 08/30/2015   Chronic systolic dysfunction of left ventricle 07/11/2014   OSA (obstructive sleep apnea) 04/07/2013   Multinodular goiter 01/20/2013   Cervical spondylosis without myelopathy 01/18/2013   Left ventricular dysfunction 12/17/2011   Low back pain 09/11/2011   Fatigue 05/22/2011  PVC (premature ventricular contraction) 01/18/2011   Persistent atrial fibrillation (HCC) 01/12/2011   Hyperlipidemia 08/02/2010   Hypertension 08/02/2010   Osteoarthritis 08/02/2010   BPH (benign prostatic hyperplasia) 08/02/2010   GERD (gastroesophageal reflux disease) 08/02/2010   h/o Atrial flutter (HCC) s/p ablation 2012     PCP: Copland, MD  REFERRING PROVIDER: Edna, MD  REFERRING DIAG: S/P Left THA posterior  THERAPY DIAG:  Pain in left hip  Muscle weakness (generalized)  Unsteadiness on feet  Difficulty in walking, not elsewhere classified  Other abnormalities of gait and mobility  Cramp and spasm  Rationale for Evaluation and Treatment: Rehabilitation  ONSET DATE: 01/07/24  SUBJECTIVE:   SUBJECTIVE STATEMENT: Doing pretty good.  No pain, just struggle walking at times   Patient underwent left posterior THA on 01/07/24, he has had Home PT until last week.  He reports that he has very little pain, just fatigue.  He reports that he has been doing the HEP given from the hospital.    PERTINENT HISTORY: A fib, prostate CA, LBP. HLD, HTN, OA, RA, bilateral TKA PAIN:  Are you having pain? Yes: NPRS scale: 0/10 Pain location: left hip/back/thigh Pain description: spasm Aggravating  factors: getting up from the chair pain up to 5/10 Relieving factors: rest, Tylenol , can have no pain  PRECAUTIONS: Posterior hip  RED FLAGS: None   WEIGHT BEARING RESTRICTIONS: No  FALLS:  Has patient fallen in last 6 months? No  LIVING ENVIRONMENT: Lives with: lives with their family and lives with their spouse Lives in: House/apartment Stairs: Yes: Internal: 14 steps; can reach both Has following equipment at home: Vannie - 2 wheeled  OCCUPATION: retired  PLOF: Independent and shopping, no device, some yard work  PATIENT GOALS: I want to walk without any device, I want to be able to do yard work  NEXT MD VISIT: October 16  OBJECTIVE:  Note: Objective measures were completed at Evaluation unless otherwise noted.  DIAGNOSTIC FINDINGS: N/A   COGNITION: Overall cognitive status: Within functional limits for tasks assessed     SENSATION: WFL  EDEMA:  Has visible edema in the left thigh and knee  MUSCLE LENGTH: Tight calves and HS  POSTURE: rounded shoulders, forward head, and decreased lumbar lordosis  PALPATION: Mild tenderness in the left thigh and hip, buttock  LOWER EXTREMITY ROM:  Active ROM Right eval Left eval  Hip flexion  45  Hip extension    Hip abduction  10  Hip adduction    Hip internal rotation    Hip external rotation    Knee flexion    Knee extension    Ankle dorsiflexion    Ankle plantarflexion    Ankle inversion    Ankle eversion     (Blank rows = not tested)  LOWER EXTREMITY MMT:  MMT Right eval Left eval  Hip flexion  3+  Hip extension    Hip abduction  3+  Hip adduction    Hip internal rotation    Hip external rotation    Knee flexion  4-  Knee extension  4-  Ankle dorsiflexion    Ankle plantarflexion    Ankle inversion    Ankle eversion     (Blank rows = not tested)  FUNCTIONAL TESTS:  5 times sit to stand: unable to do without hands, had to use the arm rests and could only complete 3 due to pain and fatigue  took 32 seconds to do 3 of the 5 Timed up and go (TUG): 32  seconds with FWW.  02/11/24 20 seconds with SPC 3 minute walk test: 200 feet with the FWW  GAIT: Distance walked: 200 feet Assistive device utilized: Walker - 2 wheeled Level of assistance: SBA Comments: slow, left toe slightly turns in                                                                                                                                TREATMENT DATE:  03/01/24 Nustep level 5 x 6 minutes Resisted gait 30# fwd and backward 3# marches and abduction 35# HS curls 5# Leg extension Walking ball toss Sit to stand with weighted ball reach Ball b/n knees squeeze Side step on and off airex CGA needed  02/25/24 Nustep level 5 x 6 minutes Leg curls 35# 3x10 Leg extension 5# 3x10 Gait outside around the entire parking [place] with 2 rests 2.5# marches and abduction Feet on ball K2C, rotation, bridges and isometric abs Blue tband clamshells Ball b/n knees squeeze  02/23/24 Nustep level 5 x 6 minutes LEg curls 35# 3x10 Leg extension 5# 3x10 2.5# marches 2.5# extension Gait outside 1/2 way around the parking [place] short standing break and then through the grass to the front door On airex ball toss Side stepping over object Forward and back stepping over object Walking in clinic without device, forward, back ward and side stepping Education and discussion on multi tasking and how when he does this he tends to be off balance, gave instructions to stop walking prior to doing another task    02/18/24 Leg curls 25# 3x10 Leg ext 5# 3x10 Gait outside 1/2 parking [place] with 1 rest break 2.5# MArches 2.5# abduction Side stepping backward walking TUG without device 16 seconds   02/16/24 Gait outside 1/2 way around the back parking [place] with 2 rest breaks standing CGA for curbs Hs curls 35# 3x10 Leg ext 5# 3x10 Nustep level 5 x 6 minutes 2.5# MArches 2.5# hip abduction Sit to stand no hands  with a raised surface  02/11/24 Nustep level 5 x 7 minutes Gait outside with SPC around a/2 the parking [place], CGA for curbs and 2 LOB but able to self right, then 2 rest breaks due to fatigue LEg curls 25# 2 x12 LEg ext 5# 2x12 TUG 20 seconds with SPC 2.5# marches, abduction and extension 2x10 each Feet on ball K2C, rotation, small bridge, isometric abs Blue tband clamshells Ball b/n knees squeeze  02/09/24 Nustep level 5 x 7 minutes 2.5# Marches 2.5# hip abduction 2.5# hip extension Calf raises all above with the FWW. LEg curls 35# 3x10 5# leg extension 2x10 40# resisted gait forward and backward, with CGA and cues Feet on ball K2C, rotation, bridge, isometric abs Ball b/n knees squeeze Blue tband clamshells  02/04/24 Nustep level 5 x 7 minutes Leg curls 25# 3x10 Leg extension 5# 3x10 Sit to stand with weighted ball 2.5# marches 2.5# hip abduction Ball b/n knees squeeze Red tband  clamshells Standing ball toss and then on airex with CGA Walking without device cues for speed and step length Backward walking  PATIENT EDUCATION:  Education details: POC/Reviewed HEP Person educated: Patient Education method: Explanation Education comprehension: verbalized understanding  HOME EXERCISE PROGRAM: From hospital   ASSESSMENT:  CLINICAL IMPRESSION: Added the resisted gait today, he did struggle with this and needed CGA, did side stepping on and off the airex, again struggles with this and required CGA.  Sit to stand is tough on him.  He did well with walking ball toss a few instances of LOB but able to correct on own.  Patient is a 89 y.o. male who was seen today for physical therapy evaluation and treatment for s/p ;eft THA posterior approach.   He was having significant pain prior to the surgery so he had been limited with his ability.  He reports that for the most part he is without pain and only takes Tylenol .  He really struggles to get up from sitting, could not  complete the 5XSTS test due to difficulty and pain.    OBJECTIVE IMPAIRMENTS: Abnormal gait, cardiopulmonary status limiting activity, decreased activity tolerance, decreased balance, decreased endurance, decreased mobility, difficulty walking, decreased ROM, decreased strength, increased edema, increased muscle spasms, impaired flexibility, postural dysfunction, and pain.   ACTIVITY LIMITATIONS: carrying, lifting, sitting, standing, squatting, sleeping, stairs, transfers, bed mobility, bathing, and locomotion level  PARTICIPATION LIMITATIONS: cleaning, laundry, driving, shopping, community activity, and yard work  PERSONAL FACTORS: Age, Fitness, Past/current experiences, 1 comorbidity:  , and 3+ comorbidities: as above are also affecting patient's functional outcome.   REHAB POTENTIAL: Good  CLINICAL DECISION MAKING: Evolving/moderate complexity  EVALUATION COMPLEXITY: Moderate   GOALS: Goals reviewed with patient? Yes  SHORT TERM GOALS: Target date: 02/25/24 Independent with initial HEP Baseline: Goal status:met 02/11/24   LONG TERM GOALS: Target date: 04/26/24  Independent with advanced HEP Baseline:  Goal status: progressing 02/25/24  2.  Go up and down stairs step over step Baseline:  Goal status: ongoing 02/25/24  3.  Get up from sitting without hands Baseline:  Goal status: progressing 02/25/24  4.  Decrease TUG time to 15 seconds Baseline: 32 seconds Goal status: progressing 20 seconds 02/11/24  5.  Walk without device or SPC for all distances Baseline:  Goal status: met 02/25/24  6.  Walk 400 feet for Baseline:  Goal status: INITIAL   PLAN:  PT FREQUENCY: 2x/week  PT DURATION: 12 weeks  PLANNED INTERVENTIONS: 97164- PT Re-evaluation, 97110-Therapeutic exercises, 97530- Therapeutic activity, 97112- Neuromuscular re-education, 97535- Self Care, [id number]- Manual therapy, [phone]- Gait training, Patient/Family education, Balance training, Stair training,  and Cryotherapy  PLAN FOR NEXT SESSION: advance walking, endurance, strength and function,    Videl Nobrega W, PT 03/01/2024, 11:01 AM

## 2024-03-03 ENCOUNTER — Encounter: Payer: Self-pay | Admitting: Physical Therapy

## 2024-03-03 ENCOUNTER — Ambulatory Visit: Admitting: Physical Therapy

## 2024-03-03 DIAGNOSIS — R2689 Other abnormalities of gait and mobility: Secondary | ICD-10-CM | POA: Diagnosis not present

## 2024-03-03 DIAGNOSIS — M6281 Muscle weakness (generalized): Secondary | ICD-10-CM | POA: Diagnosis not present

## 2024-03-03 DIAGNOSIS — R262 Difficulty in walking, not elsewhere classified: Secondary | ICD-10-CM

## 2024-03-03 DIAGNOSIS — R252 Cramp and spasm: Secondary | ICD-10-CM | POA: Diagnosis not present

## 2024-03-03 DIAGNOSIS — M25552 Pain in left hip: Secondary | ICD-10-CM

## 2024-03-03 DIAGNOSIS — R2681 Unsteadiness on feet: Secondary | ICD-10-CM

## 2024-03-03 NOTE — Therapy (Signed)
 OUTPATIENT PHYSICAL THERAPY LOWER EXTREMITY TREATMENT    Patient Name: Connor [name] MRN: [id number] DOB:05-12-1933, 89 y.o., male Today's Date: 03/03/2024  END OF SESSION:  PT End of Session - 03/03/24 1053     Visit Number 12    Number of Visits 24    Date for Recertification  05/05/24    Authorization Type [employer]    PT Start Time 1053    PT Stop Time 1141    PT Time Calculation (min) 48 min    Activity Tolerance Patient limited by fatigue;Patient tolerated treatment well    Behavior During Therapy [facility] for tasks assessed/performed          Past Medical History:  Diagnosis Date   Allergy 1985   Anemia    Benign localized prostatic hyperplasia with lower urinary tract symptoms (LUTS)    urologist-- dr matilda   Cancer [facility]) 07/2018   prostate   Cataract 12/2012   CHF (congestive heart failure) (HCC) ?   Chronic anticoagulation    Xarelto  for afib   Chronic constipation    Chronic cystitis    Chronic kidney disease    Clotting disorder    GERD (gastroesophageal reflux disease)    Gross hematuria    Heart murmur ?   History of DVT of lower extremity 06/08/2010   right femoral dvt dx post op total hip 05-08-2010   History of gastric ulcer    remote   History of kidney stones    one stone. remote hx   Hypercholesteremia    Hyperlipidemia ?   Hypertension    Dx by Dr. Salena Skill around age  61    Macular degeneration    dry in both eyes,  no injections  sees Dr. Alvia   NICM (nonischemic cardiomyopathy) [facility])    per echo 06-11-2017,  ef 45-50% with diffuse hypokinesis,  G1DD   OSA on CPAP 2017   compliant with CPAP.  managed by Dr Jude.   Osteoarthritis    Other urethral stricture, male, meatal    chronic;  pt self dilates   Paroxysmal atrial fibrillation [facility]) cardiologist-- dr kelsie   first dx 2009/   a. documented by Dr Comer on office EKG 9/12. b. maintained on Tikosyn  and Xarelto . c. s/p DCCV 's.  d.  x3  EP w/ ablation atrial fib last one  12-05-2016   Pre-diabetes    Premature ventricular contraction    Presence of Watchman left atrial appendage closure device 05/30/2022   Watchman FLX 24mm by Dr. Cindie   PVC's (premature ventricular contractions)    RA (rheumatoid arthritis) [facility])    rheumatologist--- Dr LORELEI Jacob,  treated with Humira   RBBB (right bundle branch block)    Hx of a fib, ablation Aug 2018   PVCs,   Pt had Watchman placed 05-30-2022   Rheumatoid arthritis (HCC)    Sleep apnea    Ulcer    Vitamin D  deficiency    Wears hearing aid in both ears    Past Surgical History:  Procedure Laterality Date   ABLATION OF DYSRHYTHMIC FOCUS  08/29/2015   ATRIAL FIBRILLATION ABLATION  12/05/2016   ATRIAL FIBRILLATION ABLATION N/A 12/05/2016   Procedure: Atrial Fibrillation Ablation;  Surgeon: Kelsie Lynwood, MD;  Location: [facility] INVASIVE CV LAB;  Service: Cardiovascular;  Laterality: N/A;   ATRIAL FIBRILLATION ABLATION N/A 02/29/2020   Procedure: ATRIAL FIBRILLATION ABLATION;  Surgeon: Kelsie Lynwood, MD;  Location: [facility] INVASIVE CV LAB;  Service: Cardiovascular;  Laterality:  N/A;   ATRIAL FLUTTER ABLATION  09/2010   by MILUS EDWARDS Right 1990s   CARDIOVERSION N/A 10/28/2012   Procedure: CARDIOVERSION;  Surgeon: Lonni JONETTA Cash, MD;  Location: [facility] OR;  Service: Cardiovascular;  Laterality: N/A;   CATARACT EXTRACTION W/ INTRAOCULAR LENS  IMPLANT, BILATERAL  2019   CYSTOSCOPY WITH BIOPSY N/A 09/03/2018   Procedure: CYSTOSCOPY WITH FULGURATION CELESTA;  Surgeon: Matilda Senior, MD;  Location: [facility] ORS;  Service: Urology;  Laterality: N/A;  30 MINS   CYSTOSCOPY WITH FULGERATION N/A 01/23/2022   Procedure: CYSTOSCOPY WITH FULGERATION OF PROSTATIC URETHRA;  Surgeon: Matilda Senior, MD;  Location: [facility] ORS;  Service: Urology;  Laterality: N/A;  30 MINS   DENTAL SURGERY     ELECTROPHYSIOLOGIC STUDY N/A 08/29/2015   Procedure: Atrial Fibrillation Ablation;  Surgeon: Lynwood Rakers, MD;  Location: [facility] INVASIVE CV LAB;   Service: Cardiovascular;  Laterality: N/A;   EYE SURGERY     Cataracts   JOINT REPLACEMENT  1995, 1997   KNEE SURGERY Bilateral x3   1960s & 1970s   open   LEFT ATRIAL APPENDAGE OCCLUSION N/A 05/30/2022   Procedure: LEFT ATRIAL APPENDAGE OCCLUSION;  Surgeon: Cindie Ole DASEN, MD;  Location: [facility] INVASIVE CV LAB;  Service: Cardiovascular;  Laterality: N/A;   LEFT HEART CATHETERIZATION WITH CORONARY ANGIOGRAM N/A 06/07/2011   Procedure: LEFT HEART CATHETERIZATION WITH CORONARY ANGIOGRAM;  Surgeon: Peter M Jordan, MD;  Location: [facility] CATH LAB;  Service: Cardiovascular;  Laterality: N/A;    Normal coronary arteries,  low normal LVF   TEE WITHOUT CARDIOVERSION N/A 05/30/2022   Procedure: TRANSESOPHAGEAL ECHOCARDIOGRAM (TEE);  Surgeon: Cindie Ole DASEN, MD;  Location: [facility] INVASIVE CV LAB;  Service: Cardiovascular;  Laterality: N/A;   TONSILLECTOMY AND ADENOIDECTOMY  age 79   TOTAL HIP ARTHROPLASTY Right 06/04/10    dr jane  @[facility]    TOTAL HIP ARTHROPLASTY Left 01/07/2024   Procedure: ARTHROPLASTY, HIP, TOTAL,POSTERIOR APPROACH;  Surgeon: Edna Toribio LABOR, MD;  Location: [facility] ORS;  Service: Orthopedics;  Laterality: Left;   TOTAL KNEE ARTHROPLASTY Bilateral right 1995;  left 1997   TRANSURETHRAL RESECTION OF PROSTATE  06-29-2002   dr humphries  @[facility]    TRANSURETHRAL RESECTION OF PROSTATE N/A 07/06/2018   Procedure: TRANSURETHRAL RESECTION OF THE PROSTATE (TURP);  Surgeon: Matilda Senior, MD;  Location: [facility];  Service: Urology;  Laterality: N/A;  45 MINS   Patient Active Problem List   Diagnosis Date Noted   Primary osteoarthritis of left hip 01/07/2024   Pain due to onychomycosis of toenails of both feet 07/03/2023   Atrial fibrillation (HCC) 05/30/2022   Presence of Watchman left atrial appendage closure device 05/30/2022   Hematuria 01/04/2022   Gross hematuria 01/04/2022   Secondary hypercoagulable state 03/28/2020   Malignant neoplasm of prostate (HCC) 09/11/2018    Enlarged prostate with urinary obstruction 07/06/2018   Gastrocnemius strain, right, initial encounter 12/10/2017   Baker's cyst of knee, right 12/10/2017   Obesity 03/24/2017   Paroxysmal atrial fibrillation (HCC) 12/05/2016   Near syncope 04/20/2016   Leg hematoma, left, initial encounter 04/20/2016   Bleeding    Ecchymosis    Left hamstring muscle strain    Muscle tear    RBBB 08/30/2015   Chronic systolic dysfunction of left ventricle 07/11/2014   OSA (obstructive sleep apnea) 04/07/2013   Multinodular goiter 01/20/2013   Cervical spondylosis without myelopathy 01/18/2013   Left ventricular dysfunction 12/17/2011   Low back pain 09/11/2011   Fatigue 05/22/2011  PVC (premature ventricular contraction) 01/18/2011   Persistent atrial fibrillation (HCC) 01/12/2011   Hyperlipidemia 08/02/2010   Hypertension 08/02/2010   Osteoarthritis 08/02/2010   BPH (benign prostatic hyperplasia) 08/02/2010   GERD (gastroesophageal reflux disease) 08/02/2010   h/o Atrial flutter (HCC) s/p ablation 2012     PCP: Copland, MD  REFERRING PROVIDER: Edna, MD  REFERRING DIAG: S/P Left THA posterior  THERAPY DIAG:  Pain in left hip  Muscle weakness (generalized)  Unsteadiness on feet  Difficulty in walking, not elsewhere classified  Rationale for Evaluation and Treatment: Rehabilitation  ONSET DATE: 01/07/24  SUBJECTIVE:   SUBJECTIVE STATEMENT: I had a little anterior and medial left thigh pain the other day getting in and out of the car.  I did not hurt at rest, just felt like a strain   Patient underwent left posterior THA on 01/07/24, he has had Home PT until last week.  He reports that he has very little pain, just fatigue.  He reports that he has been doing the HEP given from the hospital.    PERTINENT HISTORY: A fib, prostate CA, LBP. HLD, HTN, OA, RA, bilateral TKA PAIN:  Are you having pain? Yes: NPRS scale: 0/10 Pain location: left hip/back/thigh Pain description:  spasm Aggravating factors: getting up from the chair pain up to 5/10 Relieving factors: rest, Tylenol , can have no pain  PRECAUTIONS: Posterior hip  RED FLAGS: None   WEIGHT BEARING RESTRICTIONS: No  FALLS:  Has patient fallen in last 6 months? No  LIVING ENVIRONMENT: Lives with: lives with their family and lives with their spouse Lives in: House/apartment Stairs: Yes: Internal: 14 steps; can reach both Has following equipment at home: Vannie - 2 wheeled  OCCUPATION: retired  PLOF: Independent and shopping, no device, some yard work  PATIENT GOALS: I want to walk without any device, I want to be able to do yard work  NEXT MD VISIT: October 16  OBJECTIVE:  Note: Objective measures were completed at Evaluation unless otherwise noted.  DIAGNOSTIC FINDINGS: N/A   COGNITION: Overall cognitive status: Within functional limits for tasks assessed     SENSATION: WFL  EDEMA:  Has visible edema in the left thigh and knee  MUSCLE LENGTH: Tight calves and HS  POSTURE: rounded shoulders, forward head, and decreased lumbar lordosis  PALPATION: Mild tenderness in the left thigh and hip, buttock  LOWER EXTREMITY ROM:  Active ROM Right eval Left eval  Hip flexion  45  Hip extension    Hip abduction  10  Hip adduction    Hip internal rotation    Hip external rotation    Knee flexion    Knee extension    Ankle dorsiflexion    Ankle plantarflexion    Ankle inversion    Ankle eversion     (Blank rows = not tested)  LOWER EXTREMITY MMT:  MMT Right eval Left eval  Hip flexion  3+  Hip extension    Hip abduction  3+  Hip adduction    Hip internal rotation    Hip external rotation    Knee flexion  4-  Knee extension  4-  Ankle dorsiflexion    Ankle plantarflexion    Ankle inversion    Ankle eversion     (Blank rows = not tested)  FUNCTIONAL TESTS:  5 times sit to stand: unable to do without hands, had to use the arm rests and could only complete 3 due  to pain and fatigue took 32 seconds to do  3 of the 5 Timed up and go (TUG): 32 seconds with FWW.  02/11/24 20 seconds with SPC 3 minute walk test: 200 feet with the FWW  GAIT: Distance walked: 200 feet Assistive device utilized: Walker - 2 wheeled Level of assistance: SBA Comments: slow, left toe slightly turns in                                                                                                                                TREATMENT DATE:  03/03/24 Nustep level 5 x 6 minutes 40# resisted walking all directions 35# HS curls 3x10 10# Leg extension 3x10 2.5# marches and hip abduction Ball b/n knees squeeze Step overs side to side, front to back Side stepping  Walking ball toss Practiced stairs step over step, needed cues and hand rail  03/01/24 Nustep level 5 x 6 minutes Resisted gait 30# fwd and backward 3# marches and abduction 35# HS curls 5# Leg extension Walking ball toss Sit to stand with weighted ball reach Ball b/n knees squeeze Side step on and off airex CGA needed  02/25/24 Nustep level 5 x 6 minutes Leg curls 35# 3x10 Leg extension 5# 3x10 Gait outside around the entire parking [place] with 2 rests 2.5# marches and abduction Feet on ball K2C, rotation, bridges and isometric abs Blue tband clamshells Ball b/n knees squeeze  02/23/24 Nustep level 5 x 6 minutes LEg curls 35# 3x10 Leg extension 5# 3x10 2.5# marches 2.5# extension Gait outside 1/2 way around the parking [place] short standing break and then through the grass to the front door On airex ball toss Side stepping over object Forward and back stepping over object Walking in clinic without device, forward, back ward and side stepping Education and discussion on multi tasking and how when he does this he tends to be off balance, gave instructions to stop walking prior to doing another task    02/18/24 Leg curls 25# 3x10 Leg ext 5# 3x10 Gait outside 1/2 parking [place] with 1 rest  break 2.5# MArches 2.5# abduction Side stepping backward walking TUG without device 16 seconds   02/16/24 Gait outside 1/2 way around the back parking [place] with 2 rest breaks standing CGA for curbs Hs curls 35# 3x10 Leg ext 5# 3x10 Nustep level 5 x 6 minutes 2.5# MArches 2.5# hip abduction Sit to stand no hands with a raised surface  02/11/24 Nustep level 5 x 7 minutes Gait outside with SPC around a/2 the parking [place], CGA for curbs and 2 LOB but able to self right, then 2 rest breaks due to fatigue LEg curls 25# 2 x12 LEg ext 5# 2x12 TUG 20 seconds with SPC 2.5# marches, abduction and extension 2x10 each Feet on ball K2C, rotation, small bridge, isometric abs Blue tband clamshells Ball b/n knees squeeze  02/09/24 Nustep level 5 x 7 minutes 2.5# Marches 2.5# hip abduction 2.5# hip extension Calf raises all above with the FWW. LEg curls  35# 3x10 5# leg extension 2x10 40# resisted gait forward and backward, with CGA and cues Feet on ball K2C, rotation, bridge, isometric abs Ball b/n knees squeeze Blue tband clamshells  02/04/24 Nustep level 5 x 7 minutes Leg curls 25# 3x10 Leg extension 5# 3x10 Sit to stand with weighted ball 2.5# marches 2.5# hip abduction Ball b/n knees squeeze Red tband clamshells Standing ball toss and then on airex with CGA Walking without device cues for speed and step length Backward walking  PATIENT EDUCATION:  Education details: POC/Reviewed HEP Person educated: Patient Education method: Explanation Education comprehension: verbalized understanding  HOME EXERCISE PROGRAM: From hospital   ASSESSMENT:  CLINICAL IMPRESSION: Continue to add to the exercises , he fatigues and tends to shuffle at times, he needed CGA for the step overs and the resisted gait.  Did better with sit to stand today, still with elevated seat though.  Patient is a 89 y.o. male who was seen today for physical therapy evaluation and treatment for s/p ;eft  THA posterior approach.   He was having significant pain prior to the surgery so he had been limited with his ability.  He reports that for the most part he is without pain and only takes Tylenol .  He really struggles to get up from sitting, could not complete the 5XSTS test due to difficulty and pain.    OBJECTIVE IMPAIRMENTS: Abnormal gait, cardiopulmonary status limiting activity, decreased activity tolerance, decreased balance, decreased endurance, decreased mobility, difficulty walking, decreased ROM, decreased strength, increased edema, increased muscle spasms, impaired flexibility, postural dysfunction, and pain.   ACTIVITY LIMITATIONS: carrying, lifting, sitting, standing, squatting, sleeping, stairs, transfers, bed mobility, bathing, and locomotion level  PARTICIPATION LIMITATIONS: cleaning, laundry, driving, shopping, community activity, and yard work  PERSONAL FACTORS: Age, Fitness, Past/current experiences, 1 comorbidity:  , and 3+ comorbidities: as above are also affecting patient's functional outcome.   REHAB POTENTIAL: Good  CLINICAL DECISION MAKING: Evolving/moderate complexity  EVALUATION COMPLEXITY: Moderate   GOALS: Goals reviewed with patient? Yes  SHORT TERM GOALS: Target date: 02/25/24 Independent with initial HEP Baseline: Goal status:met 02/11/24   LONG TERM GOALS: Target date: 04/26/24  Independent with advanced HEP Baseline:  Goal status: progressing 02/25/24  2.  Go up and down stairs step over step Baseline:  Goal status: progressing 03/03/24  3.  Get up from sitting without hands Baseline:  Goal status: progressing 02/25/24  4.  Decrease TUG time to 15 seconds Baseline: 32 seconds Goal status: progressing 20 seconds 02/11/24  5.  Walk without device or SPC for all distances Baseline:  Goal status: met 02/25/24  6.  Walk 400 feet for Baseline:  Goal status: INITIAL   PLAN:  PT FREQUENCY: 2x/week  PT DURATION: 12 weeks  PLANNED  INTERVENTIONS: 97164- PT Re-evaluation, 97110-Therapeutic exercises, 97530- Therapeutic activity, 97112- Neuromuscular re-education, 97535- Self Care, [id number]- Manual therapy, [phone]- Gait training, Patient/Family education, Balance training, Stair training, and Cryotherapy  PLAN FOR NEXT SESSION: advance walking, endurance, strength and function,    Devinn Voshell W, PT 03/03/2024, 10:55 AM

## 2024-03-04 DIAGNOSIS — Z8546 Personal history of malignant neoplasm of prostate: Secondary | ICD-10-CM | POA: Diagnosis not present

## 2024-03-04 DIAGNOSIS — E349 Endocrine disorder, unspecified: Secondary | ICD-10-CM | POA: Diagnosis not present

## 2024-03-08 ENCOUNTER — Ambulatory Visit: Admitting: Podiatry

## 2024-03-08 ENCOUNTER — Encounter: Payer: Self-pay | Admitting: Podiatry

## 2024-03-08 ENCOUNTER — Ambulatory Visit: Attending: Family Medicine | Admitting: Physical Therapy

## 2024-03-08 ENCOUNTER — Encounter: Payer: Self-pay | Admitting: Physical Therapy

## 2024-03-08 DIAGNOSIS — B351 Tinea unguium: Secondary | ICD-10-CM | POA: Diagnosis not present

## 2024-03-08 DIAGNOSIS — M25552 Pain in left hip: Secondary | ICD-10-CM | POA: Diagnosis not present

## 2024-03-08 DIAGNOSIS — M79675 Pain in left toe(s): Secondary | ICD-10-CM | POA: Diagnosis not present

## 2024-03-08 DIAGNOSIS — M79674 Pain in right toe(s): Secondary | ICD-10-CM | POA: Diagnosis not present

## 2024-03-08 DIAGNOSIS — R2689 Other abnormalities of gait and mobility: Secondary | ICD-10-CM | POA: Diagnosis not present

## 2024-03-08 DIAGNOSIS — M6281 Muscle weakness (generalized): Secondary | ICD-10-CM | POA: Insufficient documentation

## 2024-03-08 DIAGNOSIS — R2681 Unsteadiness on feet: Secondary | ICD-10-CM | POA: Insufficient documentation

## 2024-03-08 DIAGNOSIS — R262 Difficulty in walking, not elsewhere classified: Secondary | ICD-10-CM | POA: Diagnosis present

## 2024-03-08 NOTE — Progress Notes (Signed)
 This patient presents to the office with chief complaint of long thick painful nails.  Patient says the nails are painful walking and wearing shoes.  This patient is unable to self treat.  This patient is unable to trim his  nails since he is unable to reach his  nails. Patient says he injured his right big toe about three weeks ago but now the swelling and redness has diminished. he presents to the office for preventative foot care services.  General Appearance  Alert, conversant and in no acute stress.  Vascular  Dorsalis pedis and posterior tibial  pulses are palpable  bilaterally.  Capillary return is within normal limits  bilaterally. Temperature is within normal limits  bilaterally.  Neurologic  Senn-Weinstein monofilament wire test within normal limits  bilaterally. Muscle power within normal limits bilaterally.  Nails Thick disfigured discolored nails with subungual debris  from hallux to fifth toes bilaterally. No evidence of bacterial infection or drainage bilaterally.  Orthopedic  No limitations of motion  feet .  No crepitus or effusions noted.  No bony pathology or digital deformities noted. HAV  B/L.  Skin  normotropic skin with no porokeratosis noted bilaterally.  No signs of infections or ulcers noted.     Onychomycosis  Nails  B/L.  Pain in right toes  Pain in left toes  Debridement of nails both feet followed trimming the nails with dremel tool.    RTC  10 weeks    Cordella Bold DPM  /sm

## 2024-03-08 NOTE — Therapy (Signed)
 OUTPATIENT PHYSICAL THERAPY LOWER EXTREMITY TREATMENT    Patient Name: EDDY LISZEWSKI MRN: [id number] DOB:1933/12/30, 89 y.o., male Today's Date: 03/08/2024  END OF SESSION:  PT End of Session - 03/08/24 1056     Visit Number 13    Number of Visits 24    Date for Recertification  05/05/24    Authorization Type [employer]    PT Start Time 1052    PT Stop Time 1137    PT Time Calculation (min) 45 min    Activity Tolerance Patient limited by fatigue;Patient tolerated treatment well    Behavior During Therapy [facility] for tasks assessed/performed          Past Medical History:  Diagnosis Date   Allergy 1985   Anemia    Benign localized prostatic hyperplasia with lower urinary tract symptoms (LUTS)    urologist-- dr matilda   Cancer [facility]) 07/2018   prostate   Cataract 12/2012   CHF (congestive heart failure) (HCC) ?   Chronic anticoagulation    Xarelto  for afib   Chronic constipation    Chronic cystitis    Chronic kidney disease    Clotting disorder    GERD (gastroesophageal reflux disease)    Gross hematuria    Heart murmur ?   History of DVT of lower extremity 06/08/2010   right femoral dvt dx post op total hip 05-08-2010   History of gastric ulcer    remote   History of kidney stones    one stone. remote hx   Hypercholesteremia    Hyperlipidemia ?   Hypertension    Dx by Dr. Salena Skill around age  89    Macular degeneration    dry in both eyes,  no injections  sees Dr. Alvia   NICM (nonischemic cardiomyopathy) [facility])    per echo 06-11-2017,  ef 45-50% with diffuse hypokinesis,  G1DD   OSA on CPAP 2017   compliant with CPAP.  managed by Dr Jude.   Osteoarthritis    Other urethral stricture, male, meatal    chronic;  pt self dilates   Paroxysmal atrial fibrillation [facility]) cardiologist-- dr kelsie   first dx 2009/   a. documented by Dr Comer on office EKG 9/12. b. maintained on Tikosyn  and Xarelto . c. s/p DCCV 's.  d.  x3  EP w/ ablation atrial fib last one  12-05-2016   Pre-diabetes    Premature ventricular contraction    Presence of Watchman left atrial appendage closure device 05/30/2022   Watchman FLX 24mm by Dr. Cindie   PVC's (premature ventricular contractions)    RA (rheumatoid arthritis) [facility])    rheumatologist--- Dr LORELEI Jacob,  treated with Humira   RBBB (right bundle branch block)    Hx of a fib, ablation Aug 2018   PVCs,   Pt had Watchman placed 05-30-2022   Rheumatoid arthritis (HCC)    Sleep apnea    Ulcer    Vitamin D  deficiency    Wears hearing aid in both ears    Past Surgical History:  Procedure Laterality Date   ABLATION OF DYSRHYTHMIC FOCUS  08/29/2015   ATRIAL FIBRILLATION ABLATION  12/05/2016   ATRIAL FIBRILLATION ABLATION N/A 12/05/2016   Procedure: Atrial Fibrillation Ablation;  Surgeon: Kelsie Lynwood, MD;  Location: [facility] INVASIVE CV LAB;  Service: Cardiovascular;  Laterality: N/A;   ATRIAL FIBRILLATION ABLATION N/A 02/29/2020   Procedure: ATRIAL FIBRILLATION ABLATION;  Surgeon: Kelsie Lynwood, MD;  Location: [facility] INVASIVE CV LAB;  Service: Cardiovascular;  Laterality:  N/A;   ATRIAL FLUTTER ABLATION  09/2010   by MILUS EDWARDS Right 1990s   CARDIOVERSION N/A 10/28/2012   Procedure: CARDIOVERSION;  Surgeon: Lonni JONETTA Cash, MD;  Location: [facility] OR;  Service: Cardiovascular;  Laterality: N/A;   CATARACT EXTRACTION W/ INTRAOCULAR LENS  IMPLANT, BILATERAL  2019   CYSTOSCOPY WITH BIOPSY N/A 09/03/2018   Procedure: CYSTOSCOPY WITH FULGURATION CELESTA;  Surgeon: Matilda Senior, MD;  Location: [facility] ORS;  Service: Urology;  Laterality: N/A;  30 MINS   CYSTOSCOPY WITH FULGERATION N/A 01/23/2022   Procedure: CYSTOSCOPY WITH FULGERATION OF PROSTATIC URETHRA;  Surgeon: Matilda Senior, MD;  Location: [facility] ORS;  Service: Urology;  Laterality: N/A;  30 MINS   DENTAL SURGERY     ELECTROPHYSIOLOGIC STUDY N/A 08/29/2015   Procedure: Atrial Fibrillation Ablation;  Surgeon: Lynwood Rakers, MD;  Location: [facility] INVASIVE CV LAB;   Service: Cardiovascular;  Laterality: N/A;   EYE SURGERY     Cataracts   JOINT REPLACEMENT  1995, 1997   KNEE SURGERY Bilateral x3   1960s & 1970s   open   LEFT ATRIAL APPENDAGE OCCLUSION N/A 05/30/2022   Procedure: LEFT ATRIAL APPENDAGE OCCLUSION;  Surgeon: Cindie Ole DASEN, MD;  Location: [facility] INVASIVE CV LAB;  Service: Cardiovascular;  Laterality: N/A;   LEFT HEART CATHETERIZATION WITH CORONARY ANGIOGRAM N/A 06/07/2011   Procedure: LEFT HEART CATHETERIZATION WITH CORONARY ANGIOGRAM;  Surgeon: Peter M Jordan, MD;  Location: [facility] CATH LAB;  Service: Cardiovascular;  Laterality: N/A;    Normal coronary arteries,  low normal LVF   TEE WITHOUT CARDIOVERSION N/A 05/30/2022   Procedure: TRANSESOPHAGEAL ECHOCARDIOGRAM (TEE);  Surgeon: Cindie Ole DASEN, MD;  Location: [facility] INVASIVE CV LAB;  Service: Cardiovascular;  Laterality: N/A;   TONSILLECTOMY AND ADENOIDECTOMY  age 68   TOTAL HIP ARTHROPLASTY Right 06/04/10    dr jane  @[facility]    TOTAL HIP ARTHROPLASTY Left 01/07/2024   Procedure: ARTHROPLASTY, HIP, TOTAL,POSTERIOR APPROACH;  Surgeon: Edna Toribio LABOR, MD;  Location: [facility] ORS;  Service: Orthopedics;  Laterality: Left;   TOTAL KNEE ARTHROPLASTY Bilateral right 1995;  left 1997   TRANSURETHRAL RESECTION OF PROSTATE  06-29-2002   dr humphries  @[facility]    TRANSURETHRAL RESECTION OF PROSTATE N/A 07/06/2018   Procedure: TRANSURETHRAL RESECTION OF THE PROSTATE (TURP);  Surgeon: Matilda Senior, MD;  Location: [facility];  Service: Urology;  Laterality: N/A;  45 MINS   Patient Active Problem List   Diagnosis Date Noted   Primary osteoarthritis of left hip 01/07/2024   Pain due to onychomycosis of toenails of both feet 07/03/2023   Atrial fibrillation (HCC) 05/30/2022   Presence of Watchman left atrial appendage closure device 05/30/2022   Hematuria 01/04/2022   Gross hematuria 01/04/2022   Secondary hypercoagulable state 03/28/2020   Malignant neoplasm of prostate (HCC) 09/11/2018    Enlarged prostate with urinary obstruction 07/06/2018   Gastrocnemius strain, right, initial encounter 12/10/2017   Baker's cyst of knee, right 12/10/2017   Obesity 03/24/2017   Paroxysmal atrial fibrillation (HCC) 12/05/2016   Near syncope 04/20/2016   Leg hematoma, left, initial encounter 04/20/2016   Bleeding    Ecchymosis    Left hamstring muscle strain    Muscle tear    RBBB 08/30/2015   Chronic systolic dysfunction of left ventricle 07/11/2014   OSA (obstructive sleep apnea) 04/07/2013   Multinodular goiter 01/20/2013   Cervical spondylosis without myelopathy 01/18/2013   Left ventricular dysfunction 12/17/2011   Low back pain 09/11/2011   Fatigue 05/22/2011  PVC (premature ventricular contraction) 01/18/2011   Persistent atrial fibrillation (HCC) 01/12/2011   Hyperlipidemia 08/02/2010   Hypertension 08/02/2010   Osteoarthritis 08/02/2010   BPH (benign prostatic hyperplasia) 08/02/2010   GERD (gastroesophageal reflux disease) 08/02/2010   h/o Atrial flutter (HCC) s/p ablation 2012     PCP: Copland, MD  REFERRING PROVIDER: Edna, MD  REFERRING DIAG: S/P Left THA posterior  THERAPY DIAG:  Pain in left hip  Muscle weakness (generalized)  Unsteadiness on feet  Difficulty in walking, not elsewhere classified  Other abnormalities of gait and mobility  Rationale for Evaluation and Treatment: Rehabilitation  ONSET DATE: 01/07/24  SUBJECTIVE:   SUBJECTIVE STATEMENT: Doing pretty good   Patient underwent left posterior THA on 01/07/24, he has had Home PT until last week.  He reports that he has very little pain, just fatigue.  He reports that he has been doing the HEP given from the hospital.    PERTINENT HISTORY: A fib, prostate CA, LBP. HLD, HTN, OA, RA, bilateral TKA PAIN:  Are you having pain? Yes: NPRS scale: 0/10 Pain location: left hip/back/thigh Pain description: spasm Aggravating factors: getting up from the chair pain up to 5/10 Relieving  factors: rest, Tylenol , can have no pain  PRECAUTIONS: Posterior hip  RED FLAGS: None   WEIGHT BEARING RESTRICTIONS: No  FALLS:  Has patient fallen in last 6 months? No  LIVING ENVIRONMENT: Lives with: lives with their family and lives with their spouse Lives in: House/apartment Stairs: Yes: Internal: 14 steps; can reach both Has following equipment at home: Vannie - 2 wheeled  OCCUPATION: retired  PLOF: Independent and shopping, no device, some yard work  PATIENT GOALS: I want to walk without any device, I want to be able to do yard work  NEXT MD VISIT: October 16  OBJECTIVE:  Note: Objective measures were completed at Evaluation unless otherwise noted.  DIAGNOSTIC FINDINGS: N/A   COGNITION: Overall cognitive status: Within functional limits for tasks assessed     SENSATION: WFL  EDEMA:  Has visible edema in the left thigh and knee  MUSCLE LENGTH: Tight calves and HS  POSTURE: rounded shoulders, forward head, and decreased lumbar lordosis  PALPATION: Mild tenderness in the left thigh and hip, buttock  LOWER EXTREMITY ROM:  Active ROM Right eval Left eval  Hip flexion  45  Hip extension    Hip abduction  10  Hip adduction    Hip internal rotation    Hip external rotation    Knee flexion    Knee extension    Ankle dorsiflexion    Ankle plantarflexion    Ankle inversion    Ankle eversion     (Blank rows = not tested)  LOWER EXTREMITY MMT:  MMT Right eval Left eval  Hip flexion  3+  Hip extension    Hip abduction  3+  Hip adduction    Hip internal rotation    Hip external rotation    Knee flexion  4-  Knee extension  4-  Ankle dorsiflexion    Ankle plantarflexion    Ankle inversion    Ankle eversion     (Blank rows = not tested)  FUNCTIONAL TESTS:  5 times sit to stand: unable to do without hands, had to use the arm rests and could only complete 3 due to pain and fatigue took 32 seconds to do 3 of the 5 Timed up and go (TUG): 32  seconds with FWW.  02/11/24 20 seconds with SPC 3 minute walk  test: 200 feet with the FWW  GAIT: Distance walked: 200 feet Assistive device utilized: Walker - 2 wheeled Level of assistance: SBA Comments: slow, left toe slightly turns in                                                                                                                                TREATMENT DATE:  03/08/24 Nustep level 5 x 6 minutes Leg curls 35# 2x10 Leg extension 5# 2x10 On airex 10# straight arm pulls On airex ball toss  On airex cone toe touches Side step on and off airex Gait outside around the parking [place] 2 rest breaks, very fatigued after this Ball b/n knees squeeze Blue tband clamshells Gait out back door, in the grass and pine [place]els uneven terrain and a curb some CGA but mostly SBA  03/03/24 Nustep level 5 x 6 minutes 40# resisted walking all directions 35# HS curls 3x10 10# Leg extension 3x10 2.5# marches and hip abduction Ball b/n knees squeeze Step overs side to side, front to back Side stepping  Walking ball toss Practiced stairs step over step, needed cues and hand rail  03/01/24 Nustep level 5 x 6 minutes Resisted gait 30# fwd and backward 3# marches and abduction 35# HS curls 5# Leg extension Walking ball toss Sit to stand with weighted ball reach Ball b/n knees squeeze Side step on and off airex CGA needed  02/25/24 Nustep level 5 x 6 minutes Leg curls 35# 3x10 Leg extension 5# 3x10 Gait outside around the entire parking [place] with 2 rests 2.5# marches and abduction Feet on ball K2C, rotation, bridges and isometric abs Blue tband clamshells Ball b/n knees squeeze  02/23/24 Nustep level 5 x 6 minutes LEg curls 35# 3x10 Leg extension 5# 3x10 2.5# marches 2.5# extension Gait outside 1/2 way around the parking [place] short standing break and then through the grass to the front door On airex ball toss Side stepping over object Forward and back stepping  over object Walking in clinic without device, forward, back ward and side stepping Education and discussion on multi tasking and how when he does this he tends to be off balance, gave instructions to stop walking prior to doing another task    02/18/24 Leg curls 25# 3x10 Leg ext 5# 3x10 Gait outside 1/2 parking [place] with 1 rest break 2.5# MArches 2.5# abduction Side stepping backward walking TUG without device 16 seconds   02/16/24 Gait outside 1/2 way around the back parking [place] with 2 rest breaks standing CGA for curbs Hs curls 35# 3x10 Leg ext 5# 3x10 Nustep level 5 x 6 minutes 2.5# MArches 2.5# hip abduction Sit to stand no hands with a raised surface  02/11/24 Nustep level 5 x 7 minutes Gait outside with SPC around a/2 the parking [place], CGA for curbs and 2 LOB but able to self right, then 2 rest breaks due to fatigue LEg curls 25# 2 x12 LEg  ext 5# 2x12 TUG 20 seconds with SPC 2.5# marches, abduction and extension 2x10 each Feet on ball K2C, rotation, small bridge, isometric abs Blue tband clamshells Ball b/n knees squeeze  02/09/24 Nustep level 5 x 7 minutes 2.5# Marches 2.5# hip abduction 2.5# hip extension Calf raises all above with the FWW. LEg curls 35# 3x10 5# leg extension 2x10 40# resisted gait forward and backward, with CGA and cues Feet on ball K2C, rotation, bridge, isometric abs Ball b/n knees squeeze Blue tband clamshells  02/04/24 Nustep level 5 x 7 minutes Leg curls 25# 3x10 Leg extension 5# 3x10 Sit to stand with weighted ball 2.5# marches 2.5# hip abduction Ball b/n knees squeeze Red tband clamshells Standing ball toss and then on airex with CGA Walking without device cues for speed and step length Backward walking  PATIENT EDUCATION:  Education details: POC/Reviewed HEP Person educated: Patient Education method: Explanation Education comprehension: verbalized understanding  HOME EXERCISE PROGRAM: From hospital    ASSESSMENT:  CLINICAL IMPRESSION: Started more proprioception today, he does struggle with this needing CGA or use of the SPC.  With the gait he does fatigue and requires some rest  Patient is a 89 y.o. male who was seen today for physical therapy evaluation and treatment for s/p ;eft THA posterior approach.   He was having significant pain prior to the surgery so he had been limited with his ability.  He reports that for the most part he is without pain and only takes Tylenol .  He really struggles to get up from sitting, could not complete the 5XSTS test due to difficulty and pain.    OBJECTIVE IMPAIRMENTS: Abnormal gait, cardiopulmonary status limiting activity, decreased activity tolerance, decreased balance, decreased endurance, decreased mobility, difficulty walking, decreased ROM, decreased strength, increased edema, increased muscle spasms, impaired flexibility, postural dysfunction, and pain.   ACTIVITY LIMITATIONS: carrying, lifting, sitting, standing, squatting, sleeping, stairs, transfers, bed mobility, bathing, and locomotion level  PARTICIPATION LIMITATIONS: cleaning, laundry, driving, shopping, community activity, and yard work  PERSONAL FACTORS: Age, Fitness, Past/current experiences, 1 comorbidity:  , and 3+ comorbidities: as above are also affecting patient's functional outcome.   REHAB POTENTIAL: Good  CLINICAL DECISION MAKING: Evolving/moderate complexity  EVALUATION COMPLEXITY: Moderate   GOALS: Goals reviewed with patient? Yes  SHORT TERM GOALS: Target date: 02/25/24 Independent with initial HEP Baseline: Goal status:met 02/11/24   LONG TERM GOALS: Target date: 04/26/24  Independent with advanced HEP Baseline:  Goal status: progressing 02/25/24  2.  Go up and down stairs step over step Baseline:  Goal status: progressing 03/03/24  3.  Get up from sitting without hands Baseline:  Goal status: progressing 02/25/24  4.  Decrease TUG time to 15  seconds Baseline: 32 seconds Goal status: progressing 20 seconds 02/11/24  5.  Walk without device or SPC for all distances Baseline:  Goal status: met 02/25/24  6.  Walk 400 feet for Baseline:  Goal status: INITIAL   PLAN:  PT FREQUENCY: 2x/week  PT DURATION: 12 weeks  PLANNED INTERVENTIONS: 97164- PT Re-evaluation, 97110-Therapeutic exercises, 97530- Therapeutic activity, 97112- Neuromuscular re-education, 97535- Self Care, [id number]- Manual therapy, [phone]- Gait training, Patient/Family education, Balance training, Stair training, and Cryotherapy  PLAN FOR NEXT SESSION: advance walking, endurance, strength and function,    Estrellita Lasky W, PT 03/08/2024, 10:58 AM

## 2024-03-10 ENCOUNTER — Ambulatory Visit: Admitting: Physical Therapy

## 2024-03-10 ENCOUNTER — Encounter: Payer: Self-pay | Admitting: Physical Therapy

## 2024-03-10 DIAGNOSIS — R2681 Unsteadiness on feet: Secondary | ICD-10-CM

## 2024-03-10 DIAGNOSIS — M6281 Muscle weakness (generalized): Secondary | ICD-10-CM | POA: Diagnosis not present

## 2024-03-10 DIAGNOSIS — R262 Difficulty in walking, not elsewhere classified: Secondary | ICD-10-CM | POA: Diagnosis not present

## 2024-03-10 DIAGNOSIS — R2689 Other abnormalities of gait and mobility: Secondary | ICD-10-CM | POA: Diagnosis not present

## 2024-03-10 DIAGNOSIS — M25552 Pain in left hip: Secondary | ICD-10-CM | POA: Diagnosis not present

## 2024-03-10 NOTE — Therapy (Signed)
 OUTPATIENT PHYSICAL THERAPY LOWER EXTREMITY TREATMENT    Patient Name: Connor [name] MRN: [id number] DOB:1933/08/20, 89 y.o., male Today's Date: 03/10/2024  END OF SESSION:  PT End of Session - 03/10/24 1058     Visit Number 14    Number of Visits 24    Date for Recertification  05/05/24    Authorization Type [employer]    PT Start Time 1055    PT Stop Time 1141    PT Time Calculation (min) 46 min    Activity Tolerance Patient limited by fatigue;Patient tolerated treatment well    Behavior During Therapy [facility] for tasks assessed/performed          Past Medical History:  Diagnosis Date   Allergy 1985   Anemia    Benign localized prostatic hyperplasia with lower urinary tract symptoms (LUTS)    urologist-- dr matilda   [facility]) 07/2018   prostate   Cataract 12/2012   CHF (congestive heart failure) (HCC) ?   Chronic anticoagulation    Xarelto  for afib   Chronic constipation    Chronic cystitis    Chronic kidney disease    Clotting disorder    GERD (gastroesophageal reflux disease)    Gross hematuria    Heart murmur ?   History of DVT of lower extremity 06/08/2010   right femoral dvt dx post op total hip 05-08-2010   History of gastric ulcer    remote   History of kidney stones    one stone. remote hx   Hypercholesteremia    Hyperlipidemia ?   Hypertension    Dx by Dr. Salena Skill around age  47    Macular degeneration    dry in both eyes,  no injections  sees Dr. Alvia   NICM (nonischemic cardiomyopathy) [facility])    per echo 06-11-2017,  ef 45-50% with diffuse hypokinesis,  G1DD   OSA on CPAP 2017   compliant with CPAP.  managed by Dr Jude.   Osteoarthritis    Other urethral stricture, male, meatal    chronic;  pt self dilates   Paroxysmal atrial fibrillation [facility]) cardiologist-- dr kelsie   first dx 2009/   a. documented by Dr Comer on office EKG 9/12. b. maintained on Tikosyn  and Xarelto . c. s/p DCCV 's.  d.  x3  EP w/ ablation atrial fib last one  12-05-2016   Pre-diabetes    Premature ventricular contraction    Presence of Watchman left atrial appendage closure device 05/30/2022   Watchman FLX 24mm by Dr. Cindie   PVC's (premature ventricular contractions)    RA (rheumatoid arthritis) [facility])    rheumatologist--- Dr LORELEI Jacob,  treated with Humira   RBBB (right bundle branch block)    Hx of a fib, ablation Aug 2018   PVCs,   Pt had Watchman placed 05-30-2022   Rheumatoid arthritis (HCC)    Sleep apnea    Ulcer    Vitamin D  deficiency    Wears hearing aid in both ears    Past Surgical History:  Procedure Laterality Date   ABLATION OF DYSRHYTHMIC FOCUS  08/29/2015   ATRIAL FIBRILLATION ABLATION  12/05/2016   ATRIAL FIBRILLATION ABLATION N/A 12/05/2016   Procedure: Atrial Fibrillation Ablation;  Surgeon: Kelsie Lynwood, MD;  Location: [facility] INVASIVE CV LAB;  Service: Cardiovascular;  Laterality: N/A;   ATRIAL FIBRILLATION ABLATION N/A 02/29/2020   Procedure: ATRIAL FIBRILLATION ABLATION;  Surgeon: Kelsie Lynwood, MD;  Location: [facility] INVASIVE CV LAB;  Service: Cardiovascular;  Laterality:  N/A;   ATRIAL FLUTTER ABLATION  09/2010   by MILUS EDWARDS Right 1990s   CARDIOVERSION N/A 10/28/2012   Procedure: CARDIOVERSION;  Surgeon: Lonni JONETTA Cash, MD;  Location: [facility] OR;  Service: Cardiovascular;  Laterality: N/A;   CATARACT EXTRACTION W/ INTRAOCULAR LENS  IMPLANT, BILATERAL  2019   CYSTOSCOPY WITH BIOPSY N/A 09/03/2018   Procedure: CYSTOSCOPY WITH FULGURATION CELESTA;  Surgeon: Matilda Senior, MD;  Location: [facility] ORS;  Service: Urology;  Laterality: N/A;  30 MINS   CYSTOSCOPY WITH FULGERATION N/A 01/23/2022   Procedure: CYSTOSCOPY WITH FULGERATION OF PROSTATIC URETHRA;  Surgeon: Matilda Senior, MD;  Location: [facility] ORS;  Service: Urology;  Laterality: N/A;  30 MINS   DENTAL SURGERY     ELECTROPHYSIOLOGIC STUDY N/A 08/29/2015   Procedure: Atrial Fibrillation Ablation;  Surgeon: Lynwood Rakers, MD;  Location: [facility] INVASIVE CV LAB;   Service: Cardiovascular;  Laterality: N/A;   EYE SURGERY     Cataracts   JOINT REPLACEMENT  1995, 1997   KNEE SURGERY Bilateral x3   1960s & 1970s   open   LEFT ATRIAL APPENDAGE OCCLUSION N/A 05/30/2022   Procedure: LEFT ATRIAL APPENDAGE OCCLUSION;  Surgeon: Cindie Ole DASEN, MD;  Location: [facility] INVASIVE CV LAB;  Service: Cardiovascular;  Laterality: N/A;   LEFT HEART CATHETERIZATION WITH CORONARY ANGIOGRAM N/A 06/07/2011   Procedure: LEFT HEART CATHETERIZATION WITH CORONARY ANGIOGRAM;  Surgeon: Peter M Jordan, MD;  Location: [facility] CATH LAB;  Service: Cardiovascular;  Laterality: N/A;    Normal coronary arteries,  low normal LVF   TEE WITHOUT CARDIOVERSION N/A 05/30/2022   Procedure: TRANSESOPHAGEAL ECHOCARDIOGRAM (TEE);  Surgeon: Cindie Ole DASEN, MD;  Location: [facility] INVASIVE CV LAB;  Service: Cardiovascular;  Laterality: N/A;   TONSILLECTOMY AND ADENOIDECTOMY  age 52   TOTAL HIP ARTHROPLASTY Right 06/04/10    dr jane  @[facility]    TOTAL HIP ARTHROPLASTY Left 01/07/2024   Procedure: ARTHROPLASTY, HIP, TOTAL,POSTERIOR APPROACH;  Surgeon: Edna Toribio LABOR, MD;  Location: [facility] ORS;  Service: Orthopedics;  Laterality: Left;   TOTAL KNEE ARTHROPLASTY Bilateral right 1995;  left 1997   TRANSURETHRAL RESECTION OF PROSTATE  06-29-2002   dr humphries  @[facility]    TRANSURETHRAL RESECTION OF PROSTATE N/A 07/06/2018   Procedure: TRANSURETHRAL RESECTION OF THE PROSTATE (TURP);  Surgeon: Matilda Senior, MD;  Location: [facility];  Service: Urology;  Laterality: N/A;  45 MINS   Patient Active Problem List   Diagnosis Date Noted   Primary osteoarthritis of left hip 01/07/2024   Pain due to onychomycosis of toenails of both feet 07/03/2023   Atrial fibrillation (HCC) 05/30/2022   Presence of Watchman left atrial appendage closure device 05/30/2022   Hematuria 01/04/2022   Gross hematuria 01/04/2022   Secondary hypercoagulable state 03/28/2020   Malignant neoplasm of prostate (HCC) 09/11/2018    Enlarged prostate with urinary obstruction 07/06/2018   Gastrocnemius strain, right, initial encounter 12/10/2017   Baker's cyst of knee, right 12/10/2017   Obesity 03/24/2017   Paroxysmal atrial fibrillation (HCC) 12/05/2016   Near syncope 04/20/2016   Leg hematoma, left, initial encounter 04/20/2016   Bleeding    Ecchymosis    Left hamstring muscle strain    Muscle tear    RBBB 08/30/2015   Chronic systolic dysfunction of left ventricle 07/11/2014   OSA (obstructive sleep apnea) 04/07/2013   Multinodular goiter 01/20/2013   Cervical spondylosis without myelopathy 01/18/2013   Left ventricular dysfunction 12/17/2011   Low back pain 09/11/2011   Fatigue 05/22/2011  PVC (premature ventricular contraction) 01/18/2011   Persistent atrial fibrillation (HCC) 01/12/2011   Hyperlipidemia 08/02/2010   Hypertension 08/02/2010   Osteoarthritis 08/02/2010   BPH (benign prostatic hyperplasia) 08/02/2010   GERD (gastroesophageal reflux disease) 08/02/2010   h/o Atrial flutter (HCC) s/p ablation 2012     PCP: Copland, MD  REFERRING PROVIDER: Edna, MD  REFERRING DIAG: S/P Left THA posterior  THERAPY DIAG:  Pain in left hip  Muscle weakness (generalized)  Unsteadiness on feet  Difficulty in walking, not elsewhere classified  Rationale for Evaluation and Treatment: Rehabilitation  ONSET DATE: 01/07/24  SUBJECTIVE:   SUBJECTIVE STATEMENT: Report that his legs were very tired after the last visit, reports that he was up more than usual   Patient underwent left posterior THA on 01/07/24, he has had Home PT until last week.  He reports that he has very little pain, just fatigue.  He reports that he has been doing the HEP given from the hospital.    PERTINENT HISTORY: A fib, prostate CA, LBP. HLD, HTN, OA, RA, bilateral TKA PAIN:  Are you having pain? Yes: NPRS scale: 0/10 Pain location: left hip/back/thigh Pain description: spasm Aggravating factors: getting up from the  chair pain up to 5/10 Relieving factors: rest, Tylenol , can have no pain  PRECAUTIONS: Posterior hip  RED FLAGS: None   WEIGHT BEARING RESTRICTIONS: No  FALLS:  Has patient fallen in last 6 months? No  LIVING ENVIRONMENT: Lives with: lives with their family and lives with their spouse Lives in: House/apartment Stairs: Yes: Internal: 14 steps; can reach both Has following equipment at home: Vannie - 2 wheeled  OCCUPATION: retired  PLOF: Independent and shopping, no device, some yard work  PATIENT GOALS: I want to walk without any device, I want to be able to do yard work  NEXT MD VISIT: October 16  OBJECTIVE:  Note: Objective measures were completed at Evaluation unless otherwise noted.  DIAGNOSTIC FINDINGS: N/A   COGNITION: Overall cognitive status: Within functional limits for tasks assessed     SENSATION: WFL  EDEMA:  Has visible edema in the left thigh and knee  MUSCLE LENGTH: Tight calves and HS  POSTURE: rounded shoulders, forward head, and decreased lumbar lordosis  PALPATION: Mild tenderness in the left thigh and hip, buttock  LOWER EXTREMITY ROM:  Active ROM Right eval Left eval  Hip flexion  45  Hip extension    Hip abduction  10  Hip adduction    Hip internal rotation    Hip external rotation    Knee flexion    Knee extension    Ankle dorsiflexion    Ankle plantarflexion    Ankle inversion    Ankle eversion     (Blank rows = not tested)  LOWER EXTREMITY MMT:  MMT Right eval Left eval  Hip flexion  3+  Hip extension    Hip abduction  3+  Hip adduction    Hip internal rotation    Hip external rotation    Knee flexion  4-  Knee extension  4-  Ankle dorsiflexion    Ankle plantarflexion    Ankle inversion    Ankle eversion     (Blank rows = not tested)  FUNCTIONAL TESTS:  5 times sit to stand: unable to do without hands, had to use the arm rests and could only complete 3 due to pain and fatigue took 32 seconds to do 3 of  the 5 Timed up and go (TUG): 32 seconds with FWW.  02/11/24 20 seconds with SPC 3 minute walk test: 200 feet with the FWW  GAIT: Distance walked: 200 feet Assistive device utilized: Walker - 2 wheeled Level of assistance: SBA Comments: slow, left toe slightly turns in                                                                                                                                TREATMENT DATE:  03/10/24 Leg curls 35# 3x10 Leg extension 5# 3x10 Gait around the back parking [place] and then around to the front of the building  3 rest breaks standing Nustep level 5 x 6 minutes 2.5# marches 2.5# hip abduction Side stepping on airex balance beam in pbars no hands CGA On airex ball toss and bounce  03/08/24 Nustep level 5 x 6 minutes Leg curls 35# 2x10 Leg extension 5# 2x10 On airex 10# straight arm pulls On airex ball toss  On airex cone toe touches Side step on and off airex Gait outside around the parking [place] 2 rest breaks, very fatigued after this Ball b/n knees squeeze Blue tband clamshells Gait out back door, in the grass and pine needels uneven terrain and a curb some CGA but mostly SBA  03/03/24 Nustep level 5 x 6 minutes 40# resisted walking all directions 35# HS curls 3x10 10# Leg extension 3x10 2.5# marches and hip abduction Ball b/n knees squeeze Step overs side to side, front to back Side stepping  Walking ball toss Practiced stairs step over step, needed cues and hand rail  03/01/24 Nustep level 5 x 6 minutes Resisted gait 30# fwd and backward 3# marches and abduction 35# HS curls 5# Leg extension Walking ball toss Sit to stand with weighted ball reach Ball b/n knees squeeze Side step on and off airex CGA needed  02/25/24 Nustep level 5 x 6 minutes Leg curls 35# 3x10 Leg extension 5# 3x10 Gait outside around the entire parking [place] with 2 rests 2.5# marches and abduction Feet on ball K2C, rotation, bridges and isometric  abs Blue tband clamshells Ball b/n knees squeeze  02/23/24 Nustep level 5 x 6 minutes LEg curls 35# 3x10 Leg extension 5# 3x10 2.5# marches 2.5# extension Gait outside 1/2 way around the parking [place] short standing break and then through the grass to the front door On airex ball toss Side stepping over object Forward and back stepping over object Walking in clinic without device, forward, back ward and side stepping Education and discussion on multi tasking and how when he does this he tends to be off balance, gave instructions to stop walking prior to doing another task    02/18/24 Leg curls 25# 3x10 Leg ext 5# 3x10 Gait outside 1/2 parking [place] with 1 rest break 2.5# MArches 2.5# abduction Side stepping backward walking TUG without device 16 seconds   02/16/24 Gait outside 1/2 way around the back parking [place] with 2 rest breaks standing CGA for curbs Hs curls 35#  3x10 Leg ext 5# 3x10 Nustep level 5 x 6 minutes 2.5# MArches 2.5# hip abduction Sit to stand no hands with a raised surface  02/11/24 Nustep level 5 x 7 minutes Gait outside with SPC around a/2 the parking [place], CGA for curbs and 2 LOB but able to self right, then 2 rest breaks due to fatigue LEg curls 25# 2 x12 LEg ext 5# 2x12 TUG 20 seconds with SPC 2.5# marches, abduction and extension 2x10 each Feet on ball K2C, rotation, small bridge, isometric abs Blue tband clamshells Ball b/n knees squeeze  02/09/24 Nustep level 5 x 7 minutes 2.5# Marches 2.5# hip abduction 2.5# hip extension Calf raises all above with the FWW. LEg curls 35# 3x10 5# leg extension 2x10 40# resisted gait forward and backward, with CGA and cues Feet on ball K2C, rotation, bridge, isometric abs Ball b/n knees squeeze Blue tband clamshells  02/04/24 Nustep level 5 x 7 minutes Leg curls 25# 3x10 Leg extension 5# 3x10 Sit to stand with weighted ball 2.5# marches 2.5# hip abduction Ball b/n knees squeeze Red tband  clamshells Standing ball toss and then on airex with CGA Walking without device cues for speed and step length Backward walking  PATIENT EDUCATION:  Education details: POC/Reviewed HEP Person educated: Patient Education method: Explanation Education comprehension: verbalized understanding  HOME EXERCISE PROGRAM: From hospital   ASSESSMENT:  CLINICAL IMPRESSION: Added distance to the walk, this was difficult for him.  Still struggles with getting up from sitting without hands, also really struggles with the dynamic surfaces.  Patient is a 89 y.o. male who was seen today for physical therapy evaluation and treatment for s/p left THA posterior approach.   He was having significant pain prior to the surgery so he had been limited with his ability.  He reports that for the most part he is without pain and only takes Tylenol .  He really struggles to get up from sitting, could not complete the 5XSTS test due to difficulty and pain.    OBJECTIVE IMPAIRMENTS: Abnormal gait, cardiopulmonary status limiting activity, decreased activity tolerance, decreased balance, decreased endurance, decreased mobility, difficulty walking, decreased ROM, decreased strength, increased edema, increased muscle spasms, impaired flexibility, postural dysfunction, and pain.   ACTIVITY LIMITATIONS: carrying, lifting, sitting, standing, squatting, sleeping, stairs, transfers, bed mobility, bathing, and locomotion level  PARTICIPATION LIMITATIONS: cleaning, laundry, driving, shopping, community activity, and yard work  PERSONAL FACTORS: Age, Fitness, Past/current experiences, 1 comorbidity:  , and 3+ comorbidities: as above are also affecting patient's functional outcome.   REHAB POTENTIAL: Good  CLINICAL DECISION MAKING: Evolving/moderate complexity  EVALUATION COMPLEXITY: Moderate   GOALS: Goals reviewed with patient? Yes  SHORT TERM GOALS: Target date: 02/25/24 Independent with initial HEP Baseline: Goal  status:met 02/11/24   LONG TERM GOALS: Target date: 04/26/24  Independent with advanced HEP Baseline:  Goal status: progressing 03/10/24  2.  Go up and down stairs step over step Baseline:  Goal status: progressing 03/10/24  3.  Get up from sitting without hands Baseline:  Goal status: progressing 03/10/24  4.  Decrease TUG time to 15 seconds Baseline: 32 seconds Goal status: progressing 20 seconds 02/11/24  5.  Walk without device or SPC for all distances Baseline:  Goal status: met 02/25/24  6.  Walk 400 feet for Baseline:  Goal status:ongoing 03/10/24   PLAN:  PT FREQUENCY: 2x/week  PT DURATION: 12 weeks  PLANNED INTERVENTIONS: 97164- PT Re-evaluation, 97110-Therapeutic exercises, 97530- Therapeutic activity, 97112- Neuromuscular re-education, 97535- Self Care, [id number]-  Manual therapy, [phone]- Gait training, Patient/Family education, Balance training, Stair training, and Cryotherapy  PLAN FOR NEXT SESSION: advance walking, endurance, strength and function,    Ladye Macnaughton W, PT 03/10/2024, 10:59 AM

## 2024-03-11 DIAGNOSIS — R9721 Rising PSA following treatment for malignant neoplasm of prostate: Secondary | ICD-10-CM | POA: Diagnosis not present

## 2024-03-11 DIAGNOSIS — N401 Enlarged prostate with lower urinary tract symptoms: Secondary | ICD-10-CM | POA: Diagnosis not present

## 2024-03-11 DIAGNOSIS — R350 Frequency of micturition: Secondary | ICD-10-CM | POA: Diagnosis not present

## 2024-03-15 ENCOUNTER — Ambulatory Visit: Admitting: Physical Therapy

## 2024-03-15 ENCOUNTER — Encounter: Payer: Self-pay | Admitting: Physical Therapy

## 2024-03-15 DIAGNOSIS — R262 Difficulty in walking, not elsewhere classified: Secondary | ICD-10-CM

## 2024-03-15 DIAGNOSIS — M6281 Muscle weakness (generalized): Secondary | ICD-10-CM | POA: Diagnosis not present

## 2024-03-15 DIAGNOSIS — M25552 Pain in left hip: Secondary | ICD-10-CM

## 2024-03-15 DIAGNOSIS — R2681 Unsteadiness on feet: Secondary | ICD-10-CM

## 2024-03-15 DIAGNOSIS — R2689 Other abnormalities of gait and mobility: Secondary | ICD-10-CM | POA: Diagnosis not present

## 2024-03-15 NOTE — Therapy (Signed)
 OUTPATIENT PHYSICAL THERAPY LOWER EXTREMITY TREATMENT    Patient Name: Connor [name] MRN: [id number] DOB:[birth year], 89 y.o., male Today's Date: 03/15/2024  END OF SESSION:  PT End of Session - 03/15/24 1313     Visit Number 15    Number of Visits 24    Date for Recertification  05/05/24    Authorization Type [employer]    PT Start Time 1310    PT Stop Time 1355    PT Time Calculation (min) 45 min    Activity Tolerance Patient limited by fatigue;Patient tolerated treatment well    Behavior During Therapy [facility] for tasks assessed/performed          Past Medical History:  Diagnosis Date   Allergy 1985   Anemia    Benign localized prostatic hyperplasia with lower urinary tract symptoms (LUTS)    urologist-- dr matilda   Cancer [facility]) 07/2018   prostate   Cataract 12/2012   CHF (congestive heart failure) (HCC) ?   Chronic anticoagulation    Xarelto  for afib   Chronic constipation    Chronic cystitis    Chronic kidney disease    Clotting disorder    GERD (gastroesophageal reflux disease)    Gross hematuria    Heart murmur ?   History of DVT of lower extremity 06/08/2010   right femoral dvt dx post op total hip 05-08-2010   History of gastric ulcer    remote   History of kidney stones    one stone. remote hx   Hypercholesteremia    Hyperlipidemia ?   Hypertension    Dx by Dr. Salena Skill around age  45    Macular degeneration    dry in both eyes,  no injections  sees Dr. Alvia   NICM (nonischemic cardiomyopathy) [facility])    per echo 06-11-2017,  ef 45-50% with diffuse hypokinesis,  G1DD   OSA on CPAP 2017   compliant with CPAP.  managed by Dr Jude.   Osteoarthritis    Other urethral stricture, male, meatal    chronic;  pt self dilates   Paroxysmal atrial fibrillation [facility]) cardiologist-- dr kelsie   first dx 2009/   a. documented by Dr Comer on office EKG 9/12. b. maintained on Tikosyn  and Xarelto . c. s/p DCCV 's.  d.  x3  EP w/ ablation atrial fib last one  12-05-2016   Pre-diabetes    Premature ventricular contraction    Presence of Watchman left atrial appendage closure device 05/30/2022   Watchman FLX 24mm by Dr. Cindie   PVC's (premature ventricular contractions)    RA (rheumatoid arthritis) [facility])    rheumatologist--- Dr LORELEI Jacob,  treated with Humira   RBBB (right bundle branch block)    Hx of a fib, ablation Aug 2018   PVCs,   Pt had Watchman placed 05-30-2022   Rheumatoid arthritis (HCC)    Sleep apnea    Ulcer    Vitamin D  deficiency    Wears hearing aid in both ears    Past Surgical History:  Procedure Laterality Date   ABLATION OF DYSRHYTHMIC FOCUS  08/29/2015   ATRIAL FIBRILLATION ABLATION  12/05/2016   ATRIAL FIBRILLATION ABLATION N/A 12/05/2016   Procedure: Atrial Fibrillation Ablation;  Surgeon: Kelsie Lynwood, MD;  Location: [facility] INVASIVE CV LAB;  Service: Cardiovascular;  Laterality: N/A;   ATRIAL FIBRILLATION ABLATION N/A 02/29/2020   Procedure: ATRIAL FIBRILLATION ABLATION;  Surgeon: Kelsie Lynwood, MD;  Location: [facility] INVASIVE CV LAB;  Service: Cardiovascular;  Laterality:  N/A;   ATRIAL FLUTTER ABLATION  09/2010   by MILUS EDWARDS Right 1990s   CARDIOVERSION N/A 10/28/2012   Procedure: CARDIOVERSION;  Surgeon: Lonni JONETTA Cash, MD;  Location: [facility] OR;  Service: Cardiovascular;  Laterality: N/A;   CATARACT EXTRACTION W/ INTRAOCULAR LENS  IMPLANT, BILATERAL  2019   CYSTOSCOPY WITH BIOPSY N/A 09/03/2018   Procedure: CYSTOSCOPY WITH FULGURATION CELESTA;  Surgeon: Matilda Senior, MD;  Location: [facility] ORS;  Service: Urology;  Laterality: N/A;  30 MINS   CYSTOSCOPY WITH FULGERATION N/A 01/23/2022   Procedure: CYSTOSCOPY WITH FULGERATION OF PROSTATIC URETHRA;  Surgeon: Matilda Senior, MD;  Location: [facility] ORS;  Service: Urology;  Laterality: N/A;  30 MINS   DENTAL SURGERY     ELECTROPHYSIOLOGIC STUDY N/A 08/29/2015   Procedure: Atrial Fibrillation Ablation;  Surgeon: Lynwood Rakers, MD;  Location: [facility] INVASIVE CV LAB;   Service: Cardiovascular;  Laterality: N/A;   EYE SURGERY     Cataracts   JOINT REPLACEMENT  1995, 1997   KNEE SURGERY Bilateral x3   1960s & 1970s   open   LEFT ATRIAL APPENDAGE OCCLUSION N/A 05/30/2022   Procedure: LEFT ATRIAL APPENDAGE OCCLUSION;  Surgeon: Cindie Ole DASEN, MD;  Location: [facility] INVASIVE CV LAB;  Service: Cardiovascular;  Laterality: N/A;   LEFT HEART CATHETERIZATION WITH CORONARY ANGIOGRAM N/A 06/07/2011   Procedure: LEFT HEART CATHETERIZATION WITH CORONARY ANGIOGRAM;  Surgeon: Peter M Jordan, MD;  Location: [facility] CATH LAB;  Service: Cardiovascular;  Laterality: N/A;    Normal coronary arteries,  low normal LVF   TEE WITHOUT CARDIOVERSION N/A 05/30/2022   Procedure: TRANSESOPHAGEAL ECHOCARDIOGRAM (TEE);  Surgeon: Cindie Ole DASEN, MD;  Location: [facility] INVASIVE CV LAB;  Service: Cardiovascular;  Laterality: N/A;   TONSILLECTOMY AND ADENOIDECTOMY  age 32   TOTAL HIP ARTHROPLASTY Right 06/04/10    dr jane  @[facility]    TOTAL HIP ARTHROPLASTY Left 01/07/2024   Procedure: ARTHROPLASTY, HIP, TOTAL,POSTERIOR APPROACH;  Surgeon: Edna Toribio LABOR, MD;  Location: [facility] ORS;  Service: Orthopedics;  Laterality: Left;   TOTAL KNEE ARTHROPLASTY Bilateral right 1995;  left 1997   TRANSURETHRAL RESECTION OF PROSTATE  06-29-2002   dr humphries  @[facility]    TRANSURETHRAL RESECTION OF PROSTATE N/A 07/06/2018   Procedure: TRANSURETHRAL RESECTION OF THE PROSTATE (TURP);  Surgeon: Matilda Senior, MD;  Location: [facility];  Service: Urology;  Laterality: N/A;  45 MINS   Patient Active Problem List   Diagnosis Date Noted   Primary osteoarthritis of left hip 01/07/2024   Pain due to onychomycosis of toenails of both feet 07/03/2023   Atrial fibrillation (HCC) 05/30/2022   Presence of Watchman left atrial appendage closure device 05/30/2022   Hematuria 01/04/2022   Gross hematuria 01/04/2022   Secondary hypercoagulable state 03/28/2020   Malignant neoplasm of prostate (HCC) 09/11/2018    Enlarged prostate with urinary obstruction 07/06/2018   Gastrocnemius strain, right, initial encounter 12/10/2017   Baker's cyst of knee, right 12/10/2017   Obesity 03/24/2017   Paroxysmal atrial fibrillation (HCC) 12/05/2016   Near syncope 04/20/2016   Leg hematoma, left, initial encounter 04/20/2016   Bleeding    Ecchymosis    Left hamstring muscle strain    Muscle tear    RBBB 08/30/2015   Chronic systolic dysfunction of left ventricle 07/11/2014   OSA (obstructive sleep apnea) 04/07/2013   Multinodular goiter 01/20/2013   Cervical spondylosis without myelopathy 01/18/2013   Left ventricular dysfunction 12/17/2011   Low back pain 09/11/2011   Fatigue 05/22/2011  PVC (premature ventricular contraction) 01/18/2011   Persistent atrial fibrillation (HCC) 01/12/2011   Hyperlipidemia 08/02/2010   Hypertension 08/02/2010   Osteoarthritis 08/02/2010   BPH (benign prostatic hyperplasia) 08/02/2010   GERD (gastroesophageal reflux disease) 08/02/2010   h/o Atrial flutter (HCC) s/p ablation 2012     PCP: Copland, MD  REFERRING PROVIDER: Edna, MD  REFERRING DIAG: S/P Left THA posterior  THERAPY DIAG:  Pain in left hip  Muscle weakness (generalized)  Unsteadiness on feet  Difficulty in walking, not elsewhere classified  Rationale for Evaluation and Treatment: Rehabilitation  ONSET DATE: 01/07/24  SUBJECTIVE:   SUBJECTIVE STATEMENT: Report that he is tired and a little sore after a barbecue on Saturday was on feet all day   Patient underwent left posterior THA on 01/07/24, he has had Home PT until last week.  He reports that he has very little pain, just fatigue.  He reports that he has been doing the HEP given from the hospital.    PERTINENT HISTORY: A fib, prostate CA, LBP. HLD, HTN, OA, RA, bilateral TKA PAIN:  Are you having pain? Yes: NPRS scale: 0/10 Pain location: left hip/back/thigh Pain description: spasm Aggravating factors: getting up from the chair  pain up to 5/10 Relieving factors: rest, Tylenol , can have no pain  PRECAUTIONS: Posterior hip  RED FLAGS: None   WEIGHT BEARING RESTRICTIONS: No  FALLS:  Has patient fallen in last 6 months? No  LIVING ENVIRONMENT: Lives with: lives with their family and lives with their spouse Lives in: House/apartment Stairs: Yes: Internal: 14 steps; can reach both Has following equipment at home: Vannie - 2 wheeled  OCCUPATION: retired  PLOF: Independent and shopping, no device, some yard work  PATIENT GOALS: I want to walk without any device, I want to be able to do yard work  NEXT MD VISIT: October 16  OBJECTIVE:  Note: Objective measures were completed at Evaluation unless otherwise noted.  DIAGNOSTIC FINDINGS: N/A   COGNITION: Overall cognitive status: Within functional limits for tasks assessed     SENSATION: WFL  EDEMA:  Has visible edema in the left thigh and knee  MUSCLE LENGTH: Tight calves and HS  POSTURE: rounded shoulders, forward head, and decreased lumbar lordosis  PALPATION: Mild tenderness in the left thigh and hip, buttock  LOWER EXTREMITY ROM:  Active ROM Right eval Left eval  Hip flexion  45  Hip extension    Hip abduction  10  Hip adduction    Hip internal rotation    Hip external rotation    Knee flexion    Knee extension    Ankle dorsiflexion    Ankle plantarflexion    Ankle inversion    Ankle eversion     (Blank rows = not tested)  LOWER EXTREMITY MMT:  MMT Right eval Left eval  Hip flexion  3+  Hip extension    Hip abduction  3+  Hip adduction    Hip internal rotation    Hip external rotation    Knee flexion  4-  Knee extension  4-  Ankle dorsiflexion    Ankle plantarflexion    Ankle inversion    Ankle eversion     (Blank rows = not tested)  FUNCTIONAL TESTS:  5 times sit to stand: unable to do without hands, had to use the arm rests and could only complete 3 due to pain and fatigue took 32 seconds to do 3 of the  5 Timed up and go (TUG): 32 seconds with FWW.  02/11/24 20 seconds with SPC 3 minute walk test: 200 feet with the FWW  GAIT: Distance walked: 200 feet Assistive device utilized: Walker - 2 wheeled Level of assistance: SBA Comments: slow, left toe slightly turns in                                                                                                                                TREATMENT DATE:  03/15/24 Nustep level 5 x 8 minutes Leg curls 35# Leg ext 5# On airex ball toss On airex cone toe touch with SPC Side step on and off aires 40# resisted walking all directions 8 toe clears More square 2 blocks step overs front to back and side to side  03/10/24 Leg curls 35# 3x10 Leg extension 5# 3x10 Gait around the back parking [place] and then around to the front of the building  3 rest breaks standing Nustep level 5 x 6 minutes 2.5# marches 2.5# hip abduction Side stepping on airex balance beam in pbars no hands CGA On airex ball toss and bounce  03/08/24 Nustep level 5 x 6 minutes Leg curls 35# 2x10 Leg extension 5# 2x10 On airex 10# straight arm pulls On airex ball toss  On airex cone toe touches Side step on and off airex Gait outside around the parking [place] 2 rest breaks, very fatigued after this Ball b/n knees squeeze Blue tband clamshells Gait out back door, in the grass and [place] uneven terrain and a curb some CGA but mostly SBA  03/03/24 Nustep level 5 x 6 minutes 40# resisted walking all directions 35# HS curls 3x10 10# Leg extension 3x10 2.5# marches and hip abduction Ball b/n knees squeeze Step overs side to side, front to back Side stepping  Walking ball toss Practiced stairs step over step, needed cues and hand rail  03/01/24 Nustep level 5 x 6 minutes Resisted gait 30# fwd and backward 3# marches and abduction 35# HS curls 5# Leg extension Walking ball toss Sit to stand with weighted ball reach Ball b/n knees squeeze Side  step on and off airex CGA needed  02/25/24 Nustep level 5 x 6 minutes Leg curls 35# 3x10 Leg extension 5# 3x10 Gait outside around the entire parking [place] with 2 rests 2.5# marches and abduction Feet on ball K2C, rotation, bridges and isometric abs Blue tband clamshells Ball b/n knees squeeze  02/23/24 Nustep level 5 x 6 minutes LEg curls 35# 3x10 Leg extension 5# 3x10 2.5# marches 2.5# extension Gait outside 1/2 way around the parking [place] short standing break and then through the grass to the front door On airex ball toss Side stepping over object Forward and back stepping over object Walking in clinic without device, forward, back ward and side stepping Education and discussion on multi tasking and how when he does this he tends to be off balance, gave instructions to stop walking prior to doing another task  02/18/24 Leg curls 25#  3x10 Leg ext 5# 3x10 Gait outside 1/2 parking [place] with 1 rest break 2.5# MArches 2.5# abduction Side stepping backward walking TUG without device 16 seconds   02/16/24 Gait outside 1/2 way around the back parking [place] with 2 rest breaks standing CGA for curbs Hs curls 35# 3x10 Leg ext 5# 3x10 Nustep level 5 x 6 minutes 2.5# MArches 2.5# hip abduction Sit to stand no hands with a raised surface  02/11/24 Nustep level 5 x 7 minutes Gait outside with SPC around a/2 the parking [place], CGA for curbs and 2 LOB but able to self right, then 2 rest breaks due to fatigue LEg curls 25# 2 x12 LEg ext 5# 2x12 TUG 20 seconds with SPC 2.5# marches, abduction and extension 2x10 each Feet on ball K2C, rotation, small bridge, isometric abs Blue tband clamshells Ball b/n knees squeeze  02/09/24 Nustep level 5 x 7 minutes 2.5# Marches 2.5# hip abduction 2.5# hip extension Calf raises all above with the FWW. LEg curls 35# 3x10 5# leg extension 2x10 40# resisted gait forward and backward, with CGA and cues Feet on ball K2C, rotation,  bridge, isometric abs Ball b/n knees squeeze Blue tband clamshells  02/04/24 Nustep level 5 x 7 minutes Leg curls 25# 3x10 Leg extension 5# 3x10 Sit to stand with weighted ball 2.5# marches 2.5# hip abduction Ball b/n knees squeeze Red tband clamshells Standing ball toss and then on airex with CGA Walking without device cues for speed and step length Backward walking  PATIENT EDUCATION:  Education details: POC/Reviewed HEP Person educated: Patient Education method: Explanation Education comprehension: verbalized understanding  HOME EXERCISE PROGRAM: From hospital   ASSESSMENT:  CLINICAL IMPRESSION: Patient really struggles with the dynamic surfaces, needed CGA.  A little more fatigued today.  He is able to walk level surfaces without cane Patient is a 89 y.o. male who was seen today for physical therapy evaluation and treatment for s/p left THA posterior approach.   He was having significant pain prior to the surgery so he had been limited with his ability.  He reports that for the most part he is without pain and only takes Tylenol .  He really struggles to get up from sitting, could not complete the 5XSTS test due to difficulty and pain.    OBJECTIVE IMPAIRMENTS: Abnormal gait, cardiopulmonary status limiting activity, decreased activity tolerance, decreased balance, decreased endurance, decreased mobility, difficulty walking, decreased ROM, decreased strength, increased edema, increased muscle spasms, impaired flexibility, postural dysfunction, and pain.   ACTIVITY LIMITATIONS: carrying, lifting, sitting, standing, squatting, sleeping, stairs, transfers, bed mobility, bathing, and locomotion level  PARTICIPATION LIMITATIONS: cleaning, laundry, driving, shopping, community activity, and yard work  PERSONAL FACTORS: Age, Fitness, Past/current experiences, 1 comorbidity:  , and 3+ comorbidities: as above are also affecting patient's functional outcome.   REHAB POTENTIAL:  Good  CLINICAL DECISION MAKING: Evolving/moderate complexity  EVALUATION COMPLEXITY: Moderate   GOALS: Goals reviewed with patient? Yes  SHORT TERM GOALS: Target date: 02/25/24 Independent with initial HEP Baseline: Goal status:met 02/11/24   LONG TERM GOALS: Target date: 04/26/24  Independent with advanced HEP Baseline:  Goal status: progressing 03/10/24  2.  Go up and down stairs step over step Baseline:  Goal status: progressing 03/10/24  3.  Get up from sitting without hands Baseline:  Goal status: progressing 03/10/24  4.  Decrease TUG time to 15 seconds Baseline: 32 seconds Goal status: progressing 20 seconds 02/11/24  5.  Walk without device or SPC for all distances Baseline:  Goal status: met 02/25/24  6.  Walk 400 feet for Baseline:  Goal status:ongoing 03/10/24   PLAN:  PT FREQUENCY: 2x/week  PT DURATION: 12 weeks  PLANNED INTERVENTIONS: 97164- PT Re-evaluation, 97110-Therapeutic exercises, 97530- Therapeutic activity, 97112- Neuromuscular re-education, 97535- Self Care, [id number]- Manual therapy, [phone]- Gait training, Patient/Family education, Balance training, Stair training, and Cryotherapy  PLAN FOR NEXT SESSION: advance walking, endurance, strength and function,    Lyonel Morejon W, PT 03/15/2024, 1:36 PM

## 2024-03-17 ENCOUNTER — Ambulatory Visit: Admitting: Physical Therapy

## 2024-03-17 ENCOUNTER — Encounter: Payer: Self-pay | Admitting: Physical Therapy

## 2024-03-17 DIAGNOSIS — R2689 Other abnormalities of gait and mobility: Secondary | ICD-10-CM | POA: Diagnosis not present

## 2024-03-17 DIAGNOSIS — R262 Difficulty in walking, not elsewhere classified: Secondary | ICD-10-CM

## 2024-03-17 DIAGNOSIS — M25552 Pain in left hip: Secondary | ICD-10-CM | POA: Diagnosis not present

## 2024-03-17 DIAGNOSIS — R2681 Unsteadiness on feet: Secondary | ICD-10-CM | POA: Diagnosis not present

## 2024-03-17 DIAGNOSIS — M6281 Muscle weakness (generalized): Secondary | ICD-10-CM | POA: Diagnosis not present

## 2024-03-17 NOTE — Therapy (Signed)
 OUTPATIENT PHYSICAL THERAPY LOWER EXTREMITY TREATMENT    Patient Name: Connor [name] MRN: [id number] DOB:Oct 14, 1933, 89 y.o., male Today's Date: 03/17/2024  END OF SESSION:  PT End of Session - 03/17/24 1118     Visit Number 16    Number of Visits 24    Date for Recertification  05/05/24    Authorization Type [employer]    PT Start Time 1055    PT Stop Time 1140    PT Time Calculation (min) 45 min    Activity Tolerance Patient limited by fatigue;Patient tolerated treatment well    Behavior During Therapy [facility] for tasks assessed/performed          Past Medical History:  Diagnosis Date   Allergy 1985   Anemia    Benign localized prostatic hyperplasia with lower urinary tract symptoms (LUTS)    urologist-- dr matilda   Cancer [facility]) 07/2018   prostate   Cataract 12/2012   CHF (congestive heart failure) (HCC) ?   Chronic anticoagulation    Xarelto  for afib   Chronic constipation    Chronic cystitis    Chronic kidney disease    Clotting disorder    GERD (gastroesophageal reflux disease)    Gross hematuria    Heart murmur ?   History of DVT of lower extremity 06/08/2010   right femoral dvt dx post op total hip 05-08-2010   History of gastric ulcer    remote   History of kidney stones    one stone. remote hx   Hypercholesteremia    Hyperlipidemia ?   Hypertension    Dx by Dr. Salena Skill around age  25    Macular degeneration    dry in both eyes,  no injections  sees Dr. Alvia   NICM (nonischemic cardiomyopathy) [facility])    per echo 06-11-2017,  ef 45-50% with diffuse hypokinesis,  G1DD   OSA on CPAP 2017   compliant with CPAP.  managed by Dr Jude.   Osteoarthritis    Other urethral stricture, male, meatal    chronic;  pt self dilates   Paroxysmal atrial fibrillation [facility]) cardiologist-- dr kelsie   first dx 2009/   a. documented by Dr Comer on office EKG 9/12. b. maintained on Tikosyn  and Xarelto . c. s/p DCCV 's.  d.  x3  EP w/ ablation atrial fib last one  12-05-2016   Pre-diabetes    Premature ventricular contraction    Presence of Watchman left atrial appendage closure device 05/30/2022   Watchman FLX 24mm by Dr. Cindie   PVC's (premature ventricular contractions)    RA (rheumatoid arthritis) [facility])    rheumatologist--- Dr LORELEI Jacob,  treated with Humira   RBBB (right bundle branch block)    Hx of a fib, ablation Aug 2018   PVCs,   Pt had Watchman placed 05-30-2022   Rheumatoid arthritis (HCC)    Sleep apnea    Ulcer    Vitamin D  deficiency    Wears hearing aid in both ears    Past Surgical History:  Procedure Laterality Date   ABLATION OF DYSRHYTHMIC FOCUS  08/29/2015   ATRIAL FIBRILLATION ABLATION  12/05/2016   ATRIAL FIBRILLATION ABLATION N/A 12/05/2016   Procedure: Atrial Fibrillation Ablation;  Surgeon: Kelsie Lynwood, MD;  Location: [facility] INVASIVE CV LAB;  Service: Cardiovascular;  Laterality: N/A;   ATRIAL FIBRILLATION ABLATION N/A 02/29/2020   Procedure: ATRIAL FIBRILLATION ABLATION;  Surgeon: Kelsie Lynwood, MD;  Location: [facility] INVASIVE CV LAB;  Service: Cardiovascular;  Laterality:  N/A;   ATRIAL FLUTTER ABLATION  09/2010   by MILUS EDWARDS Right 1990s   CARDIOVERSION N/A 10/28/2012   Procedure: CARDIOVERSION;  Surgeon: Lonni JONETTA Cash, MD;  Location: [facility] OR;  Service: Cardiovascular;  Laterality: N/A;   CATARACT EXTRACTION W/ INTRAOCULAR LENS  IMPLANT, BILATERAL  2019   CYSTOSCOPY WITH BIOPSY N/A 09/03/2018   Procedure: CYSTOSCOPY WITH FULGURATION CELESTA;  Surgeon: Matilda Senior, MD;  Location: [facility] ORS;  Service: Urology;  Laterality: N/A;  30 MINS   CYSTOSCOPY WITH FULGERATION N/A 01/23/2022   Procedure: CYSTOSCOPY WITH FULGERATION OF PROSTATIC URETHRA;  Surgeon: Matilda Senior, MD;  Location: [facility] ORS;  Service: Urology;  Laterality: N/A;  30 MINS   DENTAL SURGERY     ELECTROPHYSIOLOGIC STUDY N/A 08/29/2015   Procedure: Atrial Fibrillation Ablation;  Surgeon: Lynwood Rakers, MD;  Location: [facility] INVASIVE CV LAB;   Service: Cardiovascular;  Laterality: N/A;   EYE SURGERY     Cataracts   JOINT REPLACEMENT  1995, 1997   KNEE SURGERY Bilateral x3   1960s & 1970s   open   LEFT ATRIAL APPENDAGE OCCLUSION N/A 05/30/2022   Procedure: LEFT ATRIAL APPENDAGE OCCLUSION;  Surgeon: Cindie Ole DASEN, MD;  Location: [facility] INVASIVE CV LAB;  Service: Cardiovascular;  Laterality: N/A;   LEFT HEART CATHETERIZATION WITH CORONARY ANGIOGRAM N/A 06/07/2011   Procedure: LEFT HEART CATHETERIZATION WITH CORONARY ANGIOGRAM;  Surgeon: Peter M Jordan, MD;  Location: [facility] CATH LAB;  Service: Cardiovascular;  Laterality: N/A;    Normal coronary arteries,  low normal LVF   TEE WITHOUT CARDIOVERSION N/A 05/30/2022   Procedure: TRANSESOPHAGEAL ECHOCARDIOGRAM (TEE);  Surgeon: Cindie Ole DASEN, MD;  Location: [facility] INVASIVE CV LAB;  Service: Cardiovascular;  Laterality: N/A;   TONSILLECTOMY AND ADENOIDECTOMY  age 12   TOTAL HIP ARTHROPLASTY Right 06/04/10    dr jane  @[facility]    TOTAL HIP ARTHROPLASTY Left 01/07/2024   Procedure: ARTHROPLASTY, HIP, TOTAL,POSTERIOR APPROACH;  Surgeon: Edna Toribio LABOR, MD;  Location: [facility] ORS;  Service: Orthopedics;  Laterality: Left;   TOTAL KNEE ARTHROPLASTY Bilateral right 1995;  left 1997   TRANSURETHRAL RESECTION OF PROSTATE  06-29-2002   dr humphries  @[facility]    TRANSURETHRAL RESECTION OF PROSTATE N/A 07/06/2018   Procedure: TRANSURETHRAL RESECTION OF THE PROSTATE (TURP);  Surgeon: Matilda Senior, MD;  Location: [facility];  Service: Urology;  Laterality: N/A;  45 MINS   Patient Active Problem List   Diagnosis Date Noted   Primary osteoarthritis of left hip 01/07/2024   Pain due to onychomycosis of toenails of both feet 07/03/2023   Atrial fibrillation (HCC) 05/30/2022   Presence of Watchman left atrial appendage closure device 05/30/2022   Hematuria 01/04/2022   Gross hematuria 01/04/2022   Secondary hypercoagulable state 03/28/2020   Malignant neoplasm of prostate (HCC) 09/11/2018    Enlarged prostate with urinary obstruction 07/06/2018   Gastrocnemius strain, right, initial encounter 12/10/2017   Baker's cyst of knee, right 12/10/2017   Obesity 03/24/2017   Paroxysmal atrial fibrillation (HCC) 12/05/2016   Near syncope 04/20/2016   Leg hematoma, left, initial encounter 04/20/2016   Bleeding    Ecchymosis    Left hamstring muscle strain    Muscle tear    RBBB 08/30/2015   Chronic systolic dysfunction of left ventricle 07/11/2014   OSA (obstructive sleep apnea) 04/07/2013   Multinodular goiter 01/20/2013   Cervical spondylosis without myelopathy 01/18/2013   Left ventricular dysfunction 12/17/2011   Low back pain 09/11/2011   Fatigue 05/22/2011  PVC (premature ventricular contraction) 01/18/2011   Persistent atrial fibrillation (HCC) 01/12/2011   Hyperlipidemia 08/02/2010   Hypertension 08/02/2010   Osteoarthritis 08/02/2010   BPH (benign prostatic hyperplasia) 08/02/2010   GERD (gastroesophageal reflux disease) 08/02/2010   h/o Atrial flutter (HCC) s/p ablation 2012     PCP: Copland, MD  REFERRING PROVIDER: Edna, MD  REFERRING DIAG: S/P Left THA posterior  THERAPY DIAG:  Pain in left hip  Muscle weakness (generalized)  Unsteadiness on feet  Difficulty in walking, not elsewhere classified  Other abnormalities of gait and mobility  Rationale for Evaluation and Treatment: Rehabilitation  ONSET DATE: 01/07/24  SUBJECTIVE:   SUBJECTIVE STATEMENT: Report that he got a vaccination yesterday and is not feeling great today  Patient underwent left posterior THA on 01/07/24, he has had Home PT until last week.  He reports that he has very little pain, just fatigue.  He reports that he has been doing the HEP given from the hospital.    PERTINENT HISTORY: A fib, prostate CA, LBP. HLD, HTN, OA, RA, bilateral TKA PAIN:  Are you having pain? Yes: NPRS scale: 0/10 Pain location: left hip/back/thigh Pain description: spasm Aggravating factors:  getting up from the chair pain up to 5/10 Relieving factors: rest, Tylenol , can have no pain  PRECAUTIONS: Posterior hip  RED FLAGS: None   WEIGHT BEARING RESTRICTIONS: No  FALLS:  Has patient fallen in last 6 months? No  LIVING ENVIRONMENT: Lives with: lives with their family and lives with their spouse Lives in: House/apartment Stairs: Yes: Internal: 14 steps; can reach both Has following equipment at home: Vannie - 2 wheeled  OCCUPATION: retired  PLOF: Independent and shopping, no device, some yard work  PATIENT GOALS: I want to walk without any device, I want to be able to do yard work  NEXT MD VISIT: October 16  OBJECTIVE:  Note: Objective measures were completed at Evaluation unless otherwise noted.  DIAGNOSTIC FINDINGS: N/A   COGNITION: Overall cognitive status: Within functional limits for tasks assessed     SENSATION: WFL  EDEMA:  Has visible edema in the left thigh and knee  MUSCLE LENGTH: Tight calves and HS  POSTURE: rounded shoulders, forward head, and decreased lumbar lordosis  PALPATION: Mild tenderness in the left thigh and hip, buttock  LOWER EXTREMITY ROM:  Active ROM Right eval Left eval  Hip flexion  45  Hip extension    Hip abduction  10  Hip adduction    Hip internal rotation    Hip external rotation    Knee flexion    Knee extension    Ankle dorsiflexion    Ankle plantarflexion    Ankle inversion    Ankle eversion     (Blank rows = not tested)  LOWER EXTREMITY MMT:  MMT Right eval Left eval  Hip flexion  3+  Hip extension    Hip abduction  3+  Hip adduction    Hip internal rotation    Hip external rotation    Knee flexion  4-  Knee extension  4-  Ankle dorsiflexion    Ankle plantarflexion    Ankle inversion    Ankle eversion     (Blank rows = not tested)  FUNCTIONAL TESTS:  5 times sit to stand: unable to do without hands, had to use the arm rests and could only complete 3 due to pain and fatigue took 32  seconds to do 3 of the 5 Timed up and go (TUG): 32 seconds with FWW.  02/11/24 20 seconds with SPC 3 minute walk test: 200 feet with the FWW  GAIT: Distance walked: 200 feet Assistive device utilized: Walker - 2 wheeled Level of assistance: SBA Comments: slow, left toe slightly turns in                                                                                                                                TREATMENT DATE:  03/17/24 Leg curls 35# Leg extension 5# On airex 10# straight arm pulls More square 2 blocks step overs all directions Sit to stand with weighted ball 2x5 Nustep level 5 x 7 minutes 6  step ups holding on needs cues to push with the left leg and extend the knee 40# resisted gait all directions with CGA  03/15/24 Nustep level 5 x 8 minutes Leg curls 35# Leg ext 5# On airex ball toss On airex cone toe touch with SPC Side step on and off aires 40# resisted walking all directions 8 toe clears More square 2 blocks step overs front to back and side to side  03/10/24 Leg curls 35# 3x10 Leg extension 5# 3x10 Gait around the back parking [place] and then around to the front of the building  3 rest breaks standing Nustep level 5 x 6 minutes 2.5# marches 2.5# hip abduction Side stepping on airex balance beam in pbars no hands CGA On airex ball toss and bounce  03/08/24 Nustep level 5 x 6 minutes Leg curls 35# 2x10 Leg extension 5# 2x10 On airex 10# straight arm pulls On airex ball toss  On airex cone toe touches Side step on and off airex Gait outside around the parking [place] 2 rest breaks, very fatigued after this Ball b/n knees squeeze Blue tband clamshells Gait out back door, in the grass and [place] uneven terrain and a curb some CGA but mostly SBA  03/03/24 Nustep level 5 x 6 minutes 40# resisted walking all directions 35# HS curls 3x10 10# Leg extension 3x10 2.5# marches and hip abduction Ball b/n knees squeeze Step overs side to  side, front to back Side stepping  Walking ball toss Practiced stairs step over step, needed cues and hand rail  03/01/24 Nustep level 5 x 6 minutes Resisted gait 30# fwd and backward 3# marches and abduction 35# HS curls 5# Leg extension Walking ball toss Sit to stand with weighted ball reach Ball b/n knees squeeze Side step on and off airex CGA needed  02/25/24 Nustep level 5 x 6 minutes Leg curls 35# 3x10 Leg extension 5# 3x10 Gait outside around the entire parking [place] with 2 rests 2.5# marches and abduction Feet on ball K2C, rotation, bridges and isometric abs Blue tband clamshells Ball b/n knees squeeze  02/23/24 Nustep level 5 x 6 minutes LEg curls 35# 3x10 Leg extension 5# 3x10 2.5# marches 2.5# extension Gait outside 1/2 way around the parking [place] short standing break and then through the grass to the  front door On airex ball toss Side stepping over object Forward and back stepping over object Walking in clinic without device, forward, back ward and side stepping Education and discussion on multi tasking and how when he does this he tends to be off balance, gave instructions to stop walking prior to doing another task  02/18/24 Leg curls 25# 3x10 Leg ext 5# 3x10 Gait outside 1/2 parking [place] with 1 rest break 2.5# MArches 2.5# abduction Side stepping backward walking TUG without device 16 seconds   02/16/24 Gait outside 1/2 way around the back parking [place] with 2 rest breaks standing CGA for curbs Hs curls 35# 3x10 Leg ext 5# 3x10 Nustep level 5 x 6 minutes 2.5# MArches 2.5# hip abduction Sit to stand no hands with a raised surface  02/11/24 Nustep level 5 x 7 minutes Gait outside with SPC around a/2 the parking [place], CGA for curbs and 2 LOB but able to self right, then 2 rest breaks due to fatigue LEg curls 25# 2 x12 LEg ext 5# 2x12 TUG 20 seconds with SPC 2.5# marches, abduction and extension 2x10 each Feet on ball K2C, rotation,  small bridge, isometric abs Blue tband clamshells Ball b/n knees squeeze  PATIENT EDUCATION:  Education details: POC/Reviewed HEP Person educated: Patient Education method: Explanation Education comprehension: verbalized understanding  HOME EXERCISE PROGRAM: From hospital   ASSESSMENT:  CLINICAL IMPRESSION: Patient not feeling as well today, he reports getting vaccination yesterday, he had a few times on the 6 step ups that he could not extend the knee to get all the way up and also had a few loss of balance, he actually did well with the more square steps.  Patient is a 89 y.o. male who was seen today for physical therapy evaluation and treatment for s/p left THA posterior approach.   He was having significant pain prior to the surgery so he had been limited with his ability.  He reports that for the most part he is without pain and only takes Tylenol .  He really struggles to get up from sitting, could not complete the 5XSTS test due to difficulty and pain.    OBJECTIVE IMPAIRMENTS: Abnormal gait, cardiopulmonary status limiting activity, decreased activity tolerance, decreased balance, decreased endurance, decreased mobility, difficulty walking, decreased ROM, decreased strength, increased edema, increased muscle spasms, impaired flexibility, postural dysfunction, and pain.   ACTIVITY LIMITATIONS: carrying, lifting, sitting, standing, squatting, sleeping, stairs, transfers, bed mobility, bathing, and locomotion level  PARTICIPATION LIMITATIONS: cleaning, laundry, driving, shopping, community activity, and yard work  PERSONAL FACTORS: Age, Fitness, Past/current experiences, 1 comorbidity:  , and 3+ comorbidities: as above are also affecting patient's functional outcome.   REHAB POTENTIAL: Good  CLINICAL DECISION MAKING: Evolving/moderate complexity  EVALUATION COMPLEXITY: Moderate   GOALS: Goals reviewed with patient? Yes  SHORT TERM GOALS: Target date: 02/25/24 Independent  with initial HEP Baseline: Goal status:met 02/11/24   LONG TERM GOALS: Target date: 04/26/24  Independent with advanced HEP Baseline:  Goal status: progressing 03/10/24  2.  Go up and down stairs step over step Baseline:  Goal status: progressing 03/17/24  3.  Get up from sitting without hands Baseline:  Goal status: progressing 03/10/24  4.  Decrease TUG time to 15 seconds Baseline: 32 seconds Goal status: progressing 20 seconds 02/11/24  5.  Walk without device or SPC for all distances Baseline:  Goal status: met 02/25/24  6.  Walk 400 feet for Baseline:  Goal status:ongoing 03/10/24   PLAN:  PT FREQUENCY:  2x/week  PT DURATION: 12 weeks  PLANNED INTERVENTIONS: 97164- PT Re-evaluation, 97110-Therapeutic exercises, 97530- Therapeutic activity, 97112- Neuromuscular re-education, 97535- Self Care, [id number]- Manual therapy, [id number]- Gait training, Patient/Family education, Balance training, Stair training, and Cryotherapy  PLAN FOR NEXT SESSION: advance walking, endurance, strength and function,    Makenze Ellett W, PT 03/17/2024, 11:18 AM

## 2024-03-20 NOTE — Progress Notes (Signed)
 [facility] [address] [place], [place] [address] [phone]  Date:  03/22/2024   Name:  Connor [name]   DOB:  [birth year]   MRN:  [id number]  PCP:  Watt Harlene BROCKS, MD    Chief Complaint: No chief complaint on file.   History of Present Illness:  Connor [name] is a 89 y.o. very pleasant male patient who presents with the following:  Patient seen today for a periodic follow-up most recently in May of this year History of atrial fibrillation status post multiple ablation and watchman placed 2024, left ventricle dysfunction, prostate cancer, seronegative rheumatoid arthritis and osteoarthritis, sleep apnea.  Has some difficulty using his CPAP and typically uses it for just an hour or 2 nightly He does IM humira weekly for his RA- he continues to do this   ??  Name of his rheumatologist   He is taking aspirin , no other blood thinners at this time-he received a Watchman device due to history of hematuria and is no longer on anticoagulation  He had a total hip replacement in September Seen by cardiology, Dr. Pietro in June: 1 paroxysmal atrial fibrillation-patient remains in sinus rhythm.  Continue Toprol  at present dose.  He is status post previous ablation on 3 separate occasions.  Watchman device is in place and he is not on anticoagulation. 2 hypertension-blood pressure is controlled.  Continue present medical regimen. 3 nonischemic cardiomyopathy-LV function has normalized on most recent echocardiogram. 4 hyperlipidemia-continue statin.  He is seen by urology, Dr. Nieves for BPH.  Most recent visit was in February Seen by hematology in January for macrocytosis with and without anemia and mild thrombocytopenia-no evidence of malignancy, plan to recheck in 1 year  Atorvastatin  Humira Lisinopril  40 Toprol  XL 50 twice daily Pantoprazole  Vesicare, Flomax  Eliquis  use temporarily around time of  knee surgery Topical testosterone ?  -Flu shot - Recommend COVID booster -Recommend RSV if not done He has completed Shingrix, pneumonia Discussed the use of AI scribe software for clinical note transcription with the patient, who gave verbal consent to proceed.  History of Present Illness     Patient Active Problem List   Diagnosis Date Noted   Primary osteoarthritis of left hip 01/07/2024   Pain due to onychomycosis of toenails of both feet 07/03/2023   Atrial fibrillation (HCC) 05/30/2022   Presence of Watchman left atrial appendage closure device 05/30/2022   Hematuria 01/04/2022   Gross hematuria 01/04/2022   Secondary hypercoagulable state 03/28/2020   Malignant neoplasm of prostate (HCC) 09/11/2018   Enlarged prostate with urinary obstruction 07/06/2018   Gastrocnemius strain, right, initial encounter 12/10/2017   Baker's cyst of knee, right 12/10/2017   Obesity 03/24/2017   Paroxysmal atrial fibrillation (HCC) 12/05/2016   Near syncope 04/20/2016   Leg hematoma, left, initial encounter 04/20/2016   Bleeding    Ecchymosis    Left hamstring muscle strain    Muscle tear    RBBB 08/30/2015   Chronic systolic dysfunction of left ventricle 07/11/2014   OSA (obstructive sleep apnea) 04/07/2013   Multinodular goiter 01/20/2013   Cervical spondylosis without myelopathy 01/18/2013   Left ventricular dysfunction 12/17/2011   Low back pain 09/11/2011   Fatigue 05/22/2011   PVC (premature ventricular contraction) 01/18/2011   Persistent atrial fibrillation (HCC) 01/12/2011   Hyperlipidemia 08/02/2010   Hypertension 08/02/2010   Osteoarthritis 08/02/2010   BPH (benign prostatic hyperplasia) 08/02/2010   GERD (gastroesophageal reflux  disease) 08/02/2010   h/o Atrial flutter [facility]) s/p ablation 2012     Past Medical History:  Diagnosis Date   Allergy 1985   Anemia    Benign localized prostatic hyperplasia with lower urinary tract symptoms (LUTS)    urologist-- dr  matilda   Cancer [facility]) 07/2018   prostate   Cataract 12/2012   CHF (congestive heart failure) (HCC) ?   Chronic anticoagulation    Xarelto  for afib   Chronic constipation    Chronic cystitis    Chronic kidney disease    Clotting disorder    GERD (gastroesophageal reflux disease)    Gross hematuria    Heart murmur ?   History of DVT of lower extremity 06/08/2010   right femoral dvt dx post op total hip 05-08-2010   History of gastric ulcer    remote   History of kidney stones    one stone. remote hx   Hypercholesteremia    Hyperlipidemia ?   Hypertension    Dx by Dr. Salena Skill around age  57    Macular degeneration    dry in both eyes,  no injections  sees Dr. Alvia   NICM (nonischemic cardiomyopathy) [facility])    per echo 06-11-2017,  ef 45-50% with diffuse hypokinesis,  G1DD   OSA on CPAP 2017   compliant with CPAP.  managed by Dr Jude.   Osteoarthritis    Other urethral stricture, male, meatal    chronic;  pt self dilates   Paroxysmal atrial fibrillation [facility]) cardiologist-- dr kelsie   first dx 2009/   a. documented by Dr Comer on office EKG 9/12. b. maintained on Tikosyn  and Xarelto . c. s/p DCCV 's.  d.  x3  EP w/ ablation atrial fib last one 12-05-2016   Pre-diabetes    Premature ventricular contraction    Presence of Watchman left atrial appendage closure device 05/30/2022   Watchman FLX 24mm by Dr. Cindie   PVC's (premature ventricular contractions)    RA (rheumatoid arthritis) [facility])    rheumatologist--- Dr LORELEI Jacob,  treated with Humira   RBBB (right bundle branch block)    Hx of a fib, ablation Aug 2018   PVCs,   Pt had Watchman placed 05-30-2022   Rheumatoid arthritis (HCC)    Sleep apnea    Ulcer    Vitamin D  deficiency    Wears hearing aid in both ears     Past Surgical History:  Procedure Laterality Date   ABLATION OF DYSRHYTHMIC FOCUS  08/29/2015   ATRIAL FIBRILLATION ABLATION  12/05/2016   ATRIAL FIBRILLATION ABLATION N/A 12/05/2016    Procedure: Atrial Fibrillation Ablation;  Surgeon: Kelsie Agent, MD;  Location: [facility] INVASIVE CV LAB;  Service: Cardiovascular;  Laterality: N/A;   ATRIAL FIBRILLATION ABLATION N/A 02/29/2020   Procedure: ATRIAL FIBRILLATION ABLATION;  Surgeon: Kelsie Agent, MD;  Location: [facility] INVASIVE CV LAB;  Service: Cardiovascular;  Laterality: N/A;   ATRIAL FLUTTER ABLATION  09/2010   by MILUS EDWARDS Right 1990s   CARDIOVERSION N/A 10/28/2012   Procedure: CARDIOVERSION;  Surgeon: Lonni JONETTA Cash, MD;  Location: [facility] OR;  Service: Cardiovascular;  Laterality: N/A;   CATARACT EXTRACTION W/ INTRAOCULAR LENS  IMPLANT, BILATERAL  2019   CYSTOSCOPY WITH BIOPSY N/A 09/03/2018   Procedure: CYSTOSCOPY WITH FULGURATION CELESTA;  Surgeon: Matilda Senior, MD;  Location: [facility] ORS;  Service: Urology;  Laterality: N/A;  30 MINS   CYSTOSCOPY WITH FULGERATION N/A 01/23/2022   Procedure: CYSTOSCOPY WITH FULGERATION OF PROSTATIC  URETHRA;  Surgeon: Matilda Senior, MD;  Location: [facility] ORS;  Service: Urology;  Laterality: N/A;  30 MINS   DENTAL SURGERY     ELECTROPHYSIOLOGIC STUDY N/A 08/29/2015   Procedure: Atrial Fibrillation Ablation;  Surgeon: Lynwood Rakers, MD;  Location: [facility] INVASIVE CV LAB;  Service: Cardiovascular;  Laterality: N/A;   EYE SURGERY     Cataracts   JOINT REPLACEMENT  1995, 1997   KNEE SURGERY Bilateral x3   1960s & 1970s   open   LEFT ATRIAL APPENDAGE OCCLUSION N/A 05/30/2022   Procedure: LEFT ATRIAL APPENDAGE OCCLUSION;  Surgeon: Cindie Ole DASEN, MD;  Location: [facility] INVASIVE CV LAB;  Service: Cardiovascular;  Laterality: N/A;   LEFT HEART CATHETERIZATION WITH CORONARY ANGIOGRAM N/A 06/07/2011   Procedure: LEFT HEART CATHETERIZATION WITH CORONARY ANGIOGRAM;  Surgeon: Peter M Jordan, MD;  Location: [facility] CATH LAB;  Service: Cardiovascular;  Laterality: N/A;    Normal coronary arteries,  low normal LVF   TEE WITHOUT CARDIOVERSION N/A 05/30/2022   Procedure: TRANSESOPHAGEAL ECHOCARDIOGRAM  (TEE);  Surgeon: Cindie Ole DASEN, MD;  Location: [facility] INVASIVE CV LAB;  Service: Cardiovascular;  Laterality: N/A;   TONSILLECTOMY AND ADENOIDECTOMY  age 12   TOTAL HIP ARTHROPLASTY Right 06/04/10    dr jane  @[facility]    TOTAL HIP ARTHROPLASTY Left 01/07/2024   Procedure: ARTHROPLASTY, HIP, TOTAL,POSTERIOR APPROACH;  Surgeon: Edna Toribio LABOR, MD;  Location: [facility] ORS;  Service: Orthopedics;  Laterality: Left;   TOTAL KNEE ARTHROPLASTY Bilateral right 1995;  left 1997   TRANSURETHRAL RESECTION OF PROSTATE  06-29-2002   dr humphries  @[facility]    TRANSURETHRAL RESECTION OF PROSTATE N/A 07/06/2018   Procedure: TRANSURETHRAL RESECTION OF THE PROSTATE (TURP);  Surgeon: Matilda Senior, MD;  Location: [facility];  Service: Urology;  Laterality: N/A;  45 MINS    Social History   Tobacco Use   Smoking status: Former    Current packs/day: 0.00    Average packs/day: 1 pack/day for 30.0 years (30.0 ttl pk-yrs)    Types: Cigarettes    Start date: 05/06/1948    Quit date: 05/06/1978    Years since quitting: 45.9   Smokeless tobacco: Never   Tobacco comments:    Former smoker 03/14/2021  Vaping Use   Vaping status: Never Used  Substance Use Topics   Alcohol  use: Yes    Alcohol /week: 1.0 standard drink of alcohol     Types: 1 Glasses of wine per week    Comment: occasionally wine and beer   Drug use: Never    Family History  Problem Relation Age of Onset   Heart attack Father    Heart disease Father    Obesity Father    Lung disease Mother    Arthritis Mother    Arrhythmia Sister    Atrial fibrillation Brother    Diabetes Neg Hx     Allergies  Allergen Reactions   Penicillins Itching, Swelling and Other (See Comments)    Did it involve swelling of the face/tongue/throat, SOB, or low BP? No Did it involve sudden or severe rash/hives, skin peeling, or any reaction on the inside of your mouth or nose? No Did you need to seek medical attention at a hospital or doctor's office?  No When did it last happen?      30 + years If all above answers are "NO", may proceed with cephalosporin use.      Medication list has been reviewed and updated.  Current Outpatient Medications on File Prior to Visit  Medication Sig Dispense Refill   apixaban  (ELIQUIS ) 2.5 MG TABS tablet Take 1 tablet (2.5 mg total) by mouth 2 (two) times daily. 60 tablet 0   atorvastatin  (LIPITOR) 10 MG tablet TAKE 1 TABLET BY MOUTH EVERY DAY 90 tablet 1   Carboxymethylcellulose Sodium (THERATEARS OP) Apply 1 drop to eye daily as needed (dry eyes).     cephALEXin  (KEFLEX ) 500 MG capsule Take 2,000 mg by mouth as directed. Dental appointments     Cholecalciferol  (VITAMIN D3) 5000 UNITS TABS Take 5,000 Units by mouth every morning.     CRANBERRY PO Take 9,600 mg by mouth 2 (two) times daily. 4800 mg each     HUMIRA PEN 40 MG/0.4ML PNKT Inject 40 mg as directed every 7 (seven) days. Every Tuesday  6   lisinopril  (ZESTRIL ) 40 MG tablet TAKE 1 TABLET BY MOUTH EVERY DAY 90 tablet 3   Melatonin 10 MG CHEW Chew 3 Pieces of gum by mouth at bedtime. gummy     metoprolol  succinate (TOPROL -XL) 50 MG 24 hr tablet Take 1 tablet (50 mg total) by mouth 2 (two) times daily. Take with or immediately following a meal. 180 tablet 3   Multiple Vitamins-Minerals (PRESERVISION AREDS 2) CAPS Take 1 capsule by mouth 2 (two) times a day.     pantoprazole  (PROTONIX ) 40 MG tablet TAKE 1 TABLET BY MOUTH EVERY DAY 90 tablet 3   senna (SENOKOT) 8.6 MG TABS tablet Take 2 tablets by mouth at bedtime.     solifenacin (VESICARE) 10 MG tablet Take 10 mg by mouth at bedtime.      tamsulosin  (FLOMAX ) 0.4 MG CAPS capsule Take 1 capsule (0.4 mg total) by mouth daily after supper. 30 capsule 5   Testosterone  20.25 MG/ACT (1.62%) GEL Apply 1 Pump topically once a week. Apply to shoulders     vitamin B-12 (CYANOCOBALAMIN ) 500 MCG tablet Take 500 mcg by mouth every evening.      No current facility-administered medications on file prior to  visit.    Review of Systems:  As per HPI- otherwise negative.   Physical Examination: There were no vitals filed for this visit. There were no vitals filed for this visit. There is no height or weight on file to calculate BMI. Ideal Body Weight:    GEN: no acute distress. HEENT: Atraumatic, Normocephalic.  Ears and Nose: No external deformity. CV: RRR, No M/G/R. No JVD. No thrill. No extra heart sounds. PULM: CTA B, no wheezes, crackles, rhonchi. No retractions. No resp. distress. No accessory muscle use. ABD: S, NT, ND, +BS. No rebound. No HSM. EXTR: No c/c/e PSYCH: Normally interactive. Conversant.    Assessment and Plan: No diagnosis found.  Assessment & Plan   Signed Harlene Schroeder, MD

## 2024-03-20 NOTE — Patient Instructions (Incomplete)
 It was great to see you today, I will be in touch with your labs If not done already recommend COVID booster this fall, also recommend 1 dose of RSV vaccine

## 2024-03-22 ENCOUNTER — Ambulatory Visit: Admitting: Family Medicine

## 2024-03-22 ENCOUNTER — Ambulatory Visit: Admitting: Physical Therapy

## 2024-03-22 ENCOUNTER — Encounter: Payer: Self-pay | Admitting: Physical Therapy

## 2024-03-22 ENCOUNTER — Encounter: Payer: Self-pay | Admitting: Family Medicine

## 2024-03-22 VITALS — BP 116/64 | HR 60 | Ht 72.0 in | Wt 211.0 lb

## 2024-03-22 DIAGNOSIS — E782 Mixed hyperlipidemia: Secondary | ICD-10-CM

## 2024-03-22 DIAGNOSIS — M6281 Muscle weakness (generalized): Secondary | ICD-10-CM | POA: Diagnosis not present

## 2024-03-22 DIAGNOSIS — R2681 Unsteadiness on feet: Secondary | ICD-10-CM | POA: Diagnosis not present

## 2024-03-22 DIAGNOSIS — I10 Essential (primary) hypertension: Secondary | ICD-10-CM

## 2024-03-22 DIAGNOSIS — M25552 Pain in left hip: Secondary | ICD-10-CM | POA: Diagnosis not present

## 2024-03-22 DIAGNOSIS — D62 Acute posthemorrhagic anemia: Secondary | ICD-10-CM

## 2024-03-22 DIAGNOSIS — R262 Difficulty in walking, not elsewhere classified: Secondary | ICD-10-CM | POA: Diagnosis not present

## 2024-03-22 DIAGNOSIS — R2689 Other abnormalities of gait and mobility: Secondary | ICD-10-CM | POA: Diagnosis not present

## 2024-03-22 LAB — CBC
HCT: 39.6 % (ref 39.0–52.0)
Hemoglobin: 12.9 g/dL — ABNORMAL LOW (ref 13.0–17.0)
MCHC: 32.7 g/dL (ref 30.0–36.0)
MCV: 104.5 fl — ABNORMAL HIGH (ref 78.0–100.0)
Platelets: 146 K/uL — ABNORMAL LOW (ref 150.0–400.0)
RBC: 3.79 Mil/uL — ABNORMAL LOW (ref 4.22–5.81)
RDW: 14.6 % (ref 11.5–15.5)
WBC: 6.7 K/uL (ref 4.0–10.5)

## 2024-03-22 MED ORDER — LISINOPRIL 40 MG PO TABS: 40.0000 mg | ORAL_TABLET | Freq: Every day | ORAL | 3 refills | Status: AC

## 2024-03-22 NOTE — Therapy (Signed)
 OUTPATIENT PHYSICAL THERAPY LOWER EXTREMITY TREATMENT    Patient Name: Connor [name] MRN: [id number] DOB:14-Feb-1934, 89 y.o., male Today's Date: 03/22/2024  END OF SESSION:  PT End of Session - 03/22/24 1309     Visit Number 17    Number of Visits 24    Date for Recertification  05/05/24    Authorization Type [employer]    PT Start Time 1309    PT Stop Time 1357    PT Time Calculation (min) 48 min    Activity Tolerance Patient limited by fatigue;Patient tolerated treatment well    Behavior During Therapy [facility] for tasks assessed/performed          Past Medical History:  Diagnosis Date   Allergy 1985   Anemia    Benign localized prostatic hyperplasia with lower urinary tract symptoms (LUTS)    urologist-- dr matilda   Cancer [facility]) 07/2018   prostate   Cataract 12/2012   CHF (congestive heart failure) (HCC) ?   Chronic anticoagulation    Xarelto  for afib   Chronic constipation    Chronic cystitis    Chronic kidney disease    Clotting disorder    GERD (gastroesophageal reflux disease)    Gross hematuria    Heart murmur ?   History of DVT of lower extremity 06/08/2010   right femoral dvt dx post op total hip 05-08-2010   History of gastric ulcer    remote   History of kidney stones    one stone. remote hx   Hypercholesteremia    Hyperlipidemia ?   Hypertension    Dx by Dr. Salena Skill around age  80    Macular degeneration    dry in both eyes,  no injections  sees Dr. Alvia   NICM (nonischemic cardiomyopathy) [facility])    per echo 06-11-2017,  ef 45-50% with diffuse hypokinesis,  G1DD   OSA on CPAP 2017   compliant with CPAP.  managed by Dr Jude.   Osteoarthritis    Other urethral stricture, male, meatal    chronic;  pt self dilates   Paroxysmal atrial fibrillation [facility]) cardiologist-- dr kelsie   first dx 2009/   a. documented by Dr Comer on office EKG 9/12. b. maintained on Tikosyn  and Xarelto . c. s/p DCCV 's.  d.  x3  EP w/ ablation atrial fib last one  12-05-2016   Pre-diabetes    Premature ventricular contraction    Presence of Watchman left atrial appendage closure device 05/30/2022   Watchman FLX 24mm by Dr. Cindie   PVC's (premature ventricular contractions)    RA (rheumatoid arthritis) [facility])    rheumatologist--- Dr LORELEI Jacob,  treated with Humira   RBBB (right bundle branch block)    Hx of a fib, ablation Aug 2018   PVCs,   Pt had Watchman placed 05-30-2022   Rheumatoid arthritis (HCC)    Sleep apnea    Ulcer    Vitamin D  deficiency    Wears hearing aid in both ears    Past Surgical History:  Procedure Laterality Date   ABLATION OF DYSRHYTHMIC FOCUS  08/29/2015   ATRIAL FIBRILLATION ABLATION  12/05/2016   ATRIAL FIBRILLATION ABLATION N/A 12/05/2016   Procedure: Atrial Fibrillation Ablation;  Surgeon: Kelsie Lynwood, MD;  Location: [facility] INVASIVE CV LAB;  Service: Cardiovascular;  Laterality: N/A;   ATRIAL FIBRILLATION ABLATION N/A 02/29/2020   Procedure: ATRIAL FIBRILLATION ABLATION;  Surgeon: Kelsie Lynwood, MD;  Location: [facility] INVASIVE CV LAB;  Service: Cardiovascular;  Laterality:  N/A;   ATRIAL FLUTTER ABLATION  09/2010   by MILUS EDWARDS Right 1990s   CARDIOVERSION N/A 10/28/2012   Procedure: CARDIOVERSION;  Surgeon: Lonni JONETTA Cash, MD;  Location: [facility] OR;  Service: Cardiovascular;  Laterality: N/A;   CATARACT EXTRACTION W/ INTRAOCULAR LENS  IMPLANT, BILATERAL  2019   CYSTOSCOPY WITH BIOPSY N/A 09/03/2018   Procedure: CYSTOSCOPY WITH FULGURATION CELESTA;  Surgeon: Matilda Senior, MD;  Location: [facility] ORS;  Service: Urology;  Laterality: N/A;  30 MINS   CYSTOSCOPY WITH FULGERATION N/A 01/23/2022   Procedure: CYSTOSCOPY WITH FULGERATION OF PROSTATIC URETHRA;  Surgeon: Matilda Senior, MD;  Location: [facility] ORS;  Service: Urology;  Laterality: N/A;  30 MINS   DENTAL SURGERY     ELECTROPHYSIOLOGIC STUDY N/A 08/29/2015   Procedure: Atrial Fibrillation Ablation;  Surgeon: Lynwood Rakers, MD;  Location: [facility] INVASIVE CV LAB;   Service: Cardiovascular;  Laterality: N/A;   EYE SURGERY     Cataracts   JOINT REPLACEMENT  1995, 1997   KNEE SURGERY Bilateral x3   1960s & 1970s   open   LEFT ATRIAL APPENDAGE OCCLUSION N/A 05/30/2022   Procedure: LEFT ATRIAL APPENDAGE OCCLUSION;  Surgeon: Cindie Ole DASEN, MD;  Location: [facility] INVASIVE CV LAB;  Service: Cardiovascular;  Laterality: N/A;   LEFT HEART CATHETERIZATION WITH CORONARY ANGIOGRAM N/A 06/07/2011   Procedure: LEFT HEART CATHETERIZATION WITH CORONARY ANGIOGRAM;  Surgeon: Peter M Jordan, MD;  Location: [facility] CATH LAB;  Service: Cardiovascular;  Laterality: N/A;    Normal coronary arteries,  low normal LVF   TEE WITHOUT CARDIOVERSION N/A 05/30/2022   Procedure: TRANSESOPHAGEAL ECHOCARDIOGRAM (TEE);  Surgeon: Cindie Ole DASEN, MD;  Location: [facility] INVASIVE CV LAB;  Service: Cardiovascular;  Laterality: N/A;   TONSILLECTOMY AND ADENOIDECTOMY  age 49   TOTAL HIP ARTHROPLASTY Right 06/04/10    dr jane  @[facility]    TOTAL HIP ARTHROPLASTY Left 01/07/2024   Procedure: ARTHROPLASTY, HIP, TOTAL,POSTERIOR APPROACH;  Surgeon: Edna Toribio LABOR, MD;  Location: [facility] ORS;  Service: Orthopedics;  Laterality: Left;   TOTAL KNEE ARTHROPLASTY Bilateral right 1995;  left 1997   TRANSURETHRAL RESECTION OF PROSTATE  06-29-2002   dr humphries  @[facility]    TRANSURETHRAL RESECTION OF PROSTATE N/A 07/06/2018   Procedure: TRANSURETHRAL RESECTION OF THE PROSTATE (TURP);  Surgeon: Matilda Senior, MD;  Location: [facility];  Service: Urology;  Laterality: N/A;  45 MINS   Patient Active Problem List   Diagnosis Date Noted   Primary osteoarthritis of left hip 01/07/2024   Pain due to onychomycosis of toenails of both feet 07/03/2023   Atrial fibrillation (HCC) 05/30/2022   Presence of Watchman left atrial appendage closure device 05/30/2022   Hematuria 01/04/2022   Gross hematuria 01/04/2022   Secondary hypercoagulable state 03/28/2020   Malignant neoplasm of prostate (HCC) 09/11/2018    Enlarged prostate with urinary obstruction 07/06/2018   Gastrocnemius strain, right, initial encounter 12/10/2017   Baker's cyst of knee, right 12/10/2017   Obesity 03/24/2017   Paroxysmal atrial fibrillation (HCC) 12/05/2016   Near syncope 04/20/2016   Leg hematoma, left, initial encounter 04/20/2016   Bleeding    Ecchymosis    Left hamstring muscle strain    Muscle tear    RBBB 08/30/2015   Chronic systolic dysfunction of left ventricle 07/11/2014   OSA (obstructive sleep apnea) 04/07/2013   Multinodular goiter 01/20/2013   Cervical spondylosis without myelopathy 01/18/2013   Left ventricular dysfunction 12/17/2011   Low back pain 09/11/2011   Fatigue 05/22/2011  PVC (premature ventricular contraction) 01/18/2011   Persistent atrial fibrillation (HCC) 01/12/2011   Hyperlipidemia 08/02/2010   Hypertension 08/02/2010   Osteoarthritis 08/02/2010   BPH (benign prostatic hyperplasia) 08/02/2010   GERD (gastroesophageal reflux disease) 08/02/2010   h/o Atrial flutter (HCC) s/p ablation 2012     PCP: Copland, MD  REFERRING PROVIDER: Edna, MD  REFERRING DIAG: S/P Left THA posterior  THERAPY DIAG:  Pain in left hip  Muscle weakness (generalized)  Unsteadiness on feet  Difficulty in walking, not elsewhere classified  Rationale for Evaluation and Treatment: Rehabilitation  ONSET DATE: 01/07/24  SUBJECTIVE:   SUBJECTIVE STATEMENT: Tired, I had to walk some and had an MD appointment this morning.  Minimal pain .  He does report a fall yesterday, stepped out of his storage shed onto a ramp with leaves and slipped falling on his backside  Patient underwent left posterior THA on 01/07/24, he has had Home PT until last week.  He reports that he has very little pain, just fatigue.  He reports that he has been doing the HEP given from the hospital.    PERTINENT HISTORY: A fib, prostate CA, LBP. HLD, HTN, OA, RA, bilateral TKA PAIN:  Are you having pain? Yes: NPRS scale:  0/10 Pain location: left hip/back/thigh Pain description: spasm Aggravating factors: getting up from the chair pain up to 5/10 Relieving factors: rest, Tylenol , can have no pain  PRECAUTIONS: Posterior hip  RED FLAGS: None   WEIGHT BEARING RESTRICTIONS: No  FALLS:  Has patient fallen in last 6 months? No  LIVING ENVIRONMENT: Lives with: lives with their family and lives with their spouse Lives in: House/apartment Stairs: Yes: Internal: 14 steps; can reach both Has following equipment at home: Vannie - 2 wheeled  OCCUPATION: retired  PLOF: Independent and shopping, no device, some yard work  PATIENT GOALS: I want to walk without any device, I want to be able to do yard work  NEXT MD VISIT: October 16  OBJECTIVE:  Note: Objective measures were completed at Evaluation unless otherwise noted.  DIAGNOSTIC FINDINGS: N/A   COGNITION: Overall cognitive status: Within functional limits for tasks assessed     SENSATION: WFL  EDEMA:  Has visible edema in the left thigh and knee  MUSCLE LENGTH: Tight calves and HS  POSTURE: rounded shoulders, forward head, and decreased lumbar lordosis  PALPATION: Mild tenderness in the left thigh and hip, buttock  LOWER EXTREMITY ROM:  Active ROM Right eval Left eval  Hip flexion  45  Hip extension    Hip abduction  10  Hip adduction    Hip internal rotation    Hip external rotation    Knee flexion    Knee extension    Ankle dorsiflexion    Ankle plantarflexion    Ankle inversion    Ankle eversion     (Blank rows = not tested)  LOWER EXTREMITY MMT:  MMT Right eval Left eval  Hip flexion  3+  Hip extension    Hip abduction  3+  Hip adduction    Hip internal rotation    Hip external rotation    Knee flexion  4-  Knee extension  4-  Ankle dorsiflexion    Ankle plantarflexion    Ankle inversion    Ankle eversion     (Blank rows = not tested)  FUNCTIONAL TESTS:  5 times sit to stand: unable to do without  hands, had to use the arm rests and could only complete 3 due to  pain and fatigue took 32 seconds to do 3 of the 5 Timed up and go (TUG): 32 seconds with FWW.  02/11/24 20 seconds with SPC 3 minute walk test: 200 feet with the FWW  GAIT: Distance walked: 200 feet Assistive device utilized: Walker - 2 wheeled Level of assistance: SBA Comments: slow, left toe slightly turns in                                                                                                                                TREATMENT DATE:  03/22/24 Leg curls 35# LEg ext 5# Nustep level 5 x 6 minutes Gait outside with SPC down the hill around [place] at the bottom and then back with 3-4 rest breaks [place] 2 and 3 blocks step overs On airex ball catch some difficulty with LOB Side step on and off airex Obstacle course step over 1/2 and full bolster and then step up and down a 6 step with SPC and CGA  03/17/24 Leg curls 35# Leg extension 5# On airex 10# straight arm pulls More square 2 blocks step overs all directions Sit to stand with weighted ball 2x5 Nustep level 5 x 7 minutes 6  step ups holding on needs cues to push with the left leg and extend the knee 40# resisted gait all directions with CGA  03/15/24 Nustep level 5 x 8 minutes Leg curls 35# Leg ext 5# On airex ball toss On airex cone toe touch with SPC Side step on and off aires 40# resisted walking all directions 8 toe clears More square 2 blocks step overs front to back and side to side  03/10/24 Leg curls 35# 3x10 Leg extension 5# 3x10 Gait around the back parking [place] and then around to the front of the building  3 rest breaks standing Nustep level 5 x 6 minutes 2.5# marches 2.5# hip abduction Side stepping on airex balance beam in pbars no hands CGA On airex ball toss and bounce  03/08/24 Nustep level 5 x 6 minutes Leg curls 35# 2x10 Leg extension 5# 2x10 On airex 10# straight arm pulls On airex ball toss  On airex  cone toe touches Side step on and off airex Gait outside around the parking [place] 2 rest breaks, very fatigued after this Ball b/n knees squeeze Blue tband clamshells Gait out back door, in the grass and [place] uneven terrain and a curb some CGA but mostly SBA  03/03/24 Nustep level 5 x 6 minutes 40# resisted walking all directions 35# HS curls 3x10 10# Leg extension 3x10 2.5# marches and hip abduction Ball b/n knees squeeze Step overs side to side, front to back Side stepping  Walking ball toss Practiced stairs step over step, needed cues and hand rail  03/01/24 Nustep level 5 x 6 minutes Resisted gait 30# fwd and backward 3# marches and abduction 35# HS curls 5# Leg extension Walking ball toss Sit to stand with weighted ball  reach [employer] b/n knees squeeze Side step on and off airex CGA needed  02/25/24 Nustep level 5 x 6 minutes Leg curls 35# 3x10 Leg extension 5# 3x10 Gait outside around the entire parking [place] with 2 rests 2.5# marches and abduction Feet on ball K2C, rotation, bridges and isometric abs Blue tband clamshells Ball b/n knees squeeze  02/23/24 Nustep level 5 x 6 minutes LEg curls 35# 3x10 Leg extension 5# 3x10 2.5# marches 2.5# extension Gait outside 1/2 way around the parking [place] short standing break and then through the grass to the front door On airex ball toss Side stepping over object Forward and back stepping over object Walking in clinic without device, forward, back ward and side stepping Education and discussion on multi tasking and how when he does this he tends to be off balance, gave instructions to stop walking prior to doing another task  02/18/24 Leg curls 25# 3x10 Leg ext 5# 3x10 Gait outside 1/2 parking [place] with 1 rest break 2.5# MArches 2.5# abduction Side stepping backward walking TUG without device 16 seconds   02/16/24 Gait outside 1/2 way around the back parking [place] with 2 rest breaks standing CGA  for curbs Hs curls 35# 3x10 Leg ext 5# 3x10 Nustep level 5 x 6 minutes 2.5# MArches 2.5# hip abduction Sit to stand no hands with a raised surface  02/11/24 Nustep level 5 x 7 minutes Gait outside with SPC around a/2 the parking [place], CGA for curbs and 2 LOB but able to self right, then 2 rest breaks due to fatigue LEg curls 25# 2 x12 LEg ext 5# 2x12 TUG 20 seconds with SPC 2.5# marches, abduction and extension 2x10 each Feet on ball K2C, rotation, small bridge, isometric abs Blue tband clamshells Ball b/n knees squeeze  PATIENT EDUCATION:  Education details: POC/Reviewed HEP Person educated: Patient Education method: Explanation Education comprehension: verbalized understanding  HOME EXERCISE PROGRAM: From hospital   ASSESSMENT:  CLINICAL IMPRESSION: Patient had a fall this weekend, stepped on a ramped covered in leaves and the leaves slipped and he fell on his backside, denies injury.  We tried to walk a little further today but really struggled walking up the hill.  Still struggles on the dynamic surfaces  Patient is a 89 y.o. male who was seen today for physical therapy evaluation and treatment for s/p left THA posterior approach.   He was having significant pain prior to the surgery so he had been limited with his ability.  He reports that for the most part he is without pain and only takes Tylenol .  He really struggles to get up from sitting, could not complete the 5XSTS test due to difficulty and pain.    OBJECTIVE IMPAIRMENTS: Abnormal gait, cardiopulmonary status limiting activity, decreased activity tolerance, decreased balance, decreased endurance, decreased mobility, difficulty walking, decreased ROM, decreased strength, increased edema, increased muscle spasms, impaired flexibility, postural dysfunction, and pain.   ACTIVITY LIMITATIONS: carrying, lifting, sitting, standing, squatting, sleeping, stairs, transfers, bed mobility, bathing, and locomotion  level  PARTICIPATION LIMITATIONS: cleaning, laundry, driving, shopping, community activity, and yard work  PERSONAL FACTORS: Age, Fitness, Past/current experiences, 1 comorbidity:  , and 3+ comorbidities: as above are also affecting patient's functional outcome.   REHAB POTENTIAL: Good  CLINICAL DECISION MAKING: Evolving/moderate complexity  EVALUATION COMPLEXITY: Moderate   GOALS: Goals reviewed with patient? Yes  SHORT TERM GOALS: Target date: 02/25/24 Independent with initial HEP Baseline: Goal status:met 02/11/24   LONG TERM GOALS: Target date: 04/26/24  Independent with  advanced HEP Baseline:  Goal status: progressing 03/10/24  2.  Go up and down stairs step over step Baseline:  Goal status: progressing 03/22/24  3.  Get up from sitting without hands Baseline:  Goal status: progressing 03/10/24  4.  Decrease TUG time to 15 seconds Baseline: 32 seconds Goal status: met 04/01/24  5.  Walk without device or SPC for all distances Baseline:  Goal status: met 02/25/24  6.  Walk 400 feet for Baseline:  Goal status:ongoing 03/10/24   PLAN:  PT FREQUENCY: 2x/week  PT DURATION: 12 weeks  PLANNED INTERVENTIONS: 97164- PT Re-evaluation, 97110-Therapeutic exercises, 97530- Therapeutic activity, 97112- Neuromuscular re-education, 97535- Self Care, [id number]- Manual therapy, [id number]- Gait training, Patient/Family education, Balance training, Stair training, and Cryotherapy  PLAN FOR NEXT SESSION: advance walking, endurance, strength and function,    Ryo Klang W, PT 03/22/2024, 1:18 PM

## 2024-03-24 ENCOUNTER — Encounter: Payer: Self-pay | Admitting: Physical Therapy

## 2024-03-24 ENCOUNTER — Ambulatory Visit: Admitting: Physical Therapy

## 2024-03-24 DIAGNOSIS — M6281 Muscle weakness (generalized): Secondary | ICD-10-CM | POA: Diagnosis not present

## 2024-03-24 DIAGNOSIS — R262 Difficulty in walking, not elsewhere classified: Secondary | ICD-10-CM | POA: Diagnosis not present

## 2024-03-24 DIAGNOSIS — M25552 Pain in left hip: Secondary | ICD-10-CM | POA: Diagnosis not present

## 2024-03-24 DIAGNOSIS — R2681 Unsteadiness on feet: Secondary | ICD-10-CM | POA: Diagnosis not present

## 2024-03-24 DIAGNOSIS — R2689 Other abnormalities of gait and mobility: Secondary | ICD-10-CM | POA: Diagnosis not present

## 2024-03-24 NOTE — Therapy (Signed)
 OUTPATIENT PHYSICAL THERAPY LOWER EXTREMITY TREATMENT    Patient Name: Connor [name] MRN: [id number] DOB:09-17-1933, 89 y.o., male Today's Date: 03/24/2024  END OF SESSION:  PT End of Session - 03/24/24 1052     Visit Number 18    Number of Visits 24    Date for Recertification  05/05/24    Authorization Type [employer]    PT Start Time 1054    PT Stop Time 1138    PT Time Calculation (min) 44 min    Activity Tolerance Patient limited by fatigue;Patient tolerated treatment well    Behavior During Therapy [facility] for tasks assessed/performed          Past Medical History:  Diagnosis Date   Allergy 1985   Anemia    Benign localized prostatic hyperplasia with lower urinary tract symptoms (LUTS)    urologist-- dr matilda   Cancer [facility]) 07/2018   prostate   Cataract 12/2012   CHF (congestive heart failure) (HCC) ?   Chronic anticoagulation    Xarelto  for afib   Chronic constipation    Chronic cystitis    Chronic kidney disease    Clotting disorder    GERD (gastroesophageal reflux disease)    Gross hematuria    Heart murmur ?   History of DVT of lower extremity 06/08/2010   right femoral dvt dx post op total hip 05-08-2010   History of gastric ulcer    remote   History of kidney stones    one stone. remote hx   Hypercholesteremia    Hyperlipidemia ?   Hypertension    Dx by Dr. Salena Skill around age  13    Macular degeneration    dry in both eyes,  no injections  sees Dr. Alvia   NICM (nonischemic cardiomyopathy) [facility])    per echo 06-11-2017,  ef 45-50% with diffuse hypokinesis,  G1DD   OSA on CPAP 2017   compliant with CPAP.  managed by Dr Jude.   Osteoarthritis    Other urethral stricture, male, meatal    chronic;  pt self dilates   Paroxysmal atrial fibrillation [facility]) cardiologist-- dr kelsie   first dx 2009/   a. documented by Dr Comer on office EKG 9/12. b. maintained on Tikosyn  and Xarelto . c. s/p DCCV 's.  d.  x3  EP w/ ablation atrial fib last one  12-05-2016   Pre-diabetes    Premature ventricular contraction    Presence of Watchman left atrial appendage closure device 05/30/2022   Watchman FLX 24mm by Dr. Cindie   PVC's (premature ventricular contractions)    RA (rheumatoid arthritis) [facility])    rheumatologist--- Dr LORELEI Jacob,  treated with Humira   RBBB (right bundle branch block)    Hx of a fib, ablation Aug 2018   PVCs,   Pt had Watchman placed 05-30-2022   Rheumatoid arthritis (HCC)    Sleep apnea    Ulcer    Vitamin D  deficiency    Wears hearing aid in both ears    Past Surgical History:  Procedure Laterality Date   ABLATION OF DYSRHYTHMIC FOCUS  08/29/2015   ATRIAL FIBRILLATION ABLATION  12/05/2016   ATRIAL FIBRILLATION ABLATION N/A 12/05/2016   Procedure: Atrial Fibrillation Ablation;  Surgeon: Kelsie Lynwood, MD;  Location: [facility] INVASIVE CV LAB;  Service: Cardiovascular;  Laterality: N/A;   ATRIAL FIBRILLATION ABLATION N/A 02/29/2020   Procedure: ATRIAL FIBRILLATION ABLATION;  Surgeon: Kelsie Lynwood, MD;  Location: [facility] INVASIVE CV LAB;  Service: Cardiovascular;  Laterality:  N/A;   ATRIAL FLUTTER ABLATION  09/2010   by MILUS EDWARDS Right 1990s   CARDIOVERSION N/A 10/28/2012   Procedure: CARDIOVERSION;  Surgeon: Lonni JONETTA Cash, MD;  Location: [facility] OR;  Service: Cardiovascular;  Laterality: N/A;   CATARACT EXTRACTION W/ INTRAOCULAR LENS  IMPLANT, BILATERAL  2019   CYSTOSCOPY WITH BIOPSY N/A 09/03/2018   Procedure: CYSTOSCOPY WITH FULGURATION CELESTA;  Surgeon: Matilda Senior, MD;  Location: [facility] ORS;  Service: Urology;  Laterality: N/A;  30 MINS   CYSTOSCOPY WITH FULGERATION N/A 01/23/2022   Procedure: CYSTOSCOPY WITH FULGERATION OF PROSTATIC URETHRA;  Surgeon: Matilda Senior, MD;  Location: [facility] ORS;  Service: Urology;  Laterality: N/A;  30 MINS   DENTAL SURGERY     ELECTROPHYSIOLOGIC STUDY N/A 08/29/2015   Procedure: Atrial Fibrillation Ablation;  Surgeon: Lynwood Rakers, MD;  Location: [facility] INVASIVE CV LAB;   Service: Cardiovascular;  Laterality: N/A;   EYE SURGERY     Cataracts   JOINT REPLACEMENT  1995, 1997   KNEE SURGERY Bilateral x3   1960s & 1970s   open   LEFT ATRIAL APPENDAGE OCCLUSION N/A 05/30/2022   Procedure: LEFT ATRIAL APPENDAGE OCCLUSION;  Surgeon: Cindie Ole DASEN, MD;  Location: [facility] INVASIVE CV LAB;  Service: Cardiovascular;  Laterality: N/A;   LEFT HEART CATHETERIZATION WITH CORONARY ANGIOGRAM N/A 06/07/2011   Procedure: LEFT HEART CATHETERIZATION WITH CORONARY ANGIOGRAM;  Surgeon: Peter M Jordan, MD;  Location: [facility] CATH LAB;  Service: Cardiovascular;  Laterality: N/A;    Normal coronary arteries,  low normal LVF   TEE WITHOUT CARDIOVERSION N/A 05/30/2022   Procedure: TRANSESOPHAGEAL ECHOCARDIOGRAM (TEE);  Surgeon: Cindie Ole DASEN, MD;  Location: [facility] INVASIVE CV LAB;  Service: Cardiovascular;  Laterality: N/A;   TONSILLECTOMY AND ADENOIDECTOMY  age 66   TOTAL HIP ARTHROPLASTY Right 06/04/10    dr jane  @[facility]    TOTAL HIP ARTHROPLASTY Left 01/07/2024   Procedure: ARTHROPLASTY, HIP, TOTAL,POSTERIOR APPROACH;  Surgeon: Edna Toribio LABOR, MD;  Location: [facility] ORS;  Service: Orthopedics;  Laterality: Left;   TOTAL KNEE ARTHROPLASTY Bilateral right 1995;  left 1997   TRANSURETHRAL RESECTION OF PROSTATE  06-29-2002   dr humphries  @[facility]    TRANSURETHRAL RESECTION OF PROSTATE N/A 07/06/2018   Procedure: TRANSURETHRAL RESECTION OF THE PROSTATE (TURP);  Surgeon: Matilda Senior, MD;  Location: [facility];  Service: Urology;  Laterality: N/A;  45 MINS   Patient Active Problem List   Diagnosis Date Noted   Primary osteoarthritis of left hip 01/07/2024   Pain due to onychomycosis of toenails of both feet 07/03/2023   Atrial fibrillation (HCC) 05/30/2022   Presence of Watchman left atrial appendage closure device 05/30/2022   Hematuria 01/04/2022   Gross hematuria 01/04/2022   Secondary hypercoagulable state 03/28/2020   Malignant neoplasm of prostate (HCC) 09/11/2018    Enlarged prostate with urinary obstruction 07/06/2018   Gastrocnemius strain, right, initial encounter 12/10/2017   Baker's cyst of knee, right 12/10/2017   Obesity 03/24/2017   Paroxysmal atrial fibrillation (HCC) 12/05/2016   Near syncope 04/20/2016   Leg hematoma, left, initial encounter 04/20/2016   Bleeding    Ecchymosis    Left hamstring muscle strain    Muscle tear    RBBB 08/30/2015   Chronic systolic dysfunction of left ventricle 07/11/2014   OSA (obstructive sleep apnea) 04/07/2013   Multinodular goiter 01/20/2013   Cervical spondylosis without myelopathy 01/18/2013   Left ventricular dysfunction 12/17/2011   Low back pain 09/11/2011   Fatigue 05/22/2011  PVC (premature ventricular contraction) 01/18/2011   Persistent atrial fibrillation (HCC) 01/12/2011   Hyperlipidemia 08/02/2010   Hypertension 08/02/2010   Osteoarthritis 08/02/2010   BPH (benign prostatic hyperplasia) 08/02/2010   GERD (gastroesophageal reflux disease) 08/02/2010   h/o Atrial flutter (HCC) s/p ablation 2012     PCP: Copland, MD  REFERRING PROVIDER: Edna, MD  REFERRING DIAG: S/P Left THA posterior  THERAPY DIAG:  Pain in left hip  Muscle weakness (generalized)  Unsteadiness on feet  Difficulty in walking, not elsewhere classified  Rationale for Evaluation and Treatment: Rehabilitation  ONSET DATE: 01/07/24  SUBJECTIVE:   SUBJECTIVE STATEMENT: Reports that he is sore from his fall and reports that he was very tired from the last session  Patient underwent left posterior THA on 01/07/24, he has had Home PT until last week.  He reports that he has very little pain, just fatigue.  He reports that he has been doing the HEP given from the hospital.    PERTINENT HISTORY: A fib, prostate CA, LBP. HLD, HTN, OA, RA, bilateral TKA PAIN:  Are you having pain? Yes: NPRS scale: 0/10 Pain location: left hip/back/thigh Pain description: spasm Aggravating factors: getting up from the chair  pain up to 5/10 Relieving factors: rest, Tylenol , can have no pain  PRECAUTIONS: Posterior hip  RED FLAGS: None   WEIGHT BEARING RESTRICTIONS: No  FALLS:  Has patient fallen in last 6 months? No  LIVING ENVIRONMENT: Lives with: lives with their family and lives with their spouse Lives in: House/apartment Stairs: Yes: Internal: 14 steps; can reach both Has following equipment at home: Vannie - 2 wheeled  OCCUPATION: retired  PLOF: Independent and shopping, no device, some yard work  PATIENT GOALS: I want to walk without any device, I want to be able to do yard work  NEXT MD VISIT: October 16  OBJECTIVE:  Note: Objective measures were completed at Evaluation unless otherwise noted.  DIAGNOSTIC FINDINGS: N/A   COGNITION: Overall cognitive status: Within functional limits for tasks assessed     SENSATION: WFL  EDEMA:  Has visible edema in the left thigh and knee  MUSCLE LENGTH: Tight calves and HS  POSTURE: rounded shoulders, forward head, and decreased lumbar lordosis  PALPATION: Mild tenderness in the left thigh and hip, buttock  LOWER EXTREMITY ROM:  Active ROM Right eval Left eval  Hip flexion  45  Hip extension    Hip abduction  10  Hip adduction    Hip internal rotation    Hip external rotation    Knee flexion    Knee extension    Ankle dorsiflexion    Ankle plantarflexion    Ankle inversion    Ankle eversion     (Blank rows = not tested)  LOWER EXTREMITY MMT:  MMT Right eval Left eval  Hip flexion  3+  Hip extension    Hip abduction  3+  Hip adduction    Hip internal rotation    Hip external rotation    Knee flexion  4-  Knee extension  4-  Ankle dorsiflexion    Ankle plantarflexion    Ankle inversion    Ankle eversion     (Blank rows = not tested)  FUNCTIONAL TESTS:  5 times sit to stand: unable to do without hands, had to use the arm rests and could only complete 3 due to pain and fatigue took 32 seconds to do 3 of the  5 Timed up and go (TUG): 32 seconds with FWW.  02/11/24 20 seconds with SPC 3 minute walk test: 200 feet with the FWW  GAIT: Distance walked: 200 feet Assistive device utilized: Walker - 2 wheeled Level of assistance: SBA Comments: slow, left toe slightly turns in                                                                                                                                TREATMENT DATE:  03/24/24 Leg curls 35# 3x10 Leg extension 5# 3x10 Gait outside long walk all the way around the back building, with a seated rest break at a picnic table, walking up the hill had to use the hand rail on the side walk, standing rest break toward the top, then another standing rest break before we made it back to the building Discussion on building strength and endurance as well as being safe, using the SPC on long walks or when outside , not using the cane when feeling good and when inside  03/22/24 Leg curls 35# LEg ext 5# Nustep level 5 x 6 minutes Gait outside with SPC down the hill around [place] at the bottom and then back with 3-4 rest breaks [place] 2 and 3 blocks step overs On airex ball catch some difficulty with LOB Side step on and off airex Obstacle course step over 1/2 and full bolster and then step up and down a 6 step with SPC and CGA  03/17/24 Leg curls 35# Leg extension 5# On airex 10# straight arm pulls More square 2 blocks step overs all directions Sit to stand with weighted ball 2x5 Nustep level 5 x 7 minutes 6  step ups holding on needs cues to push with the left leg and extend the knee 40# resisted gait all directions with CGA  03/15/24 Nustep level 5 x 8 minutes Leg curls 35# Leg ext 5# On airex ball toss On airex cone toe touch with SPC Side step on and off aires 40# resisted walking all directions 8 toe clears More square 2 blocks step overs front to back and side to side  03/10/24 Leg curls 35# 3x10 Leg extension 5# 3x10 Gait around  the back parking [place] and then around to the front of the building  3 rest breaks standing Nustep level 5 x 6 minutes 2.5# marches 2.5# hip abduction Side stepping on airex balance beam in pbars no hands CGA On airex ball toss and bounce  03/08/24 Nustep level 5 x 6 minutes Leg curls 35# 2x10 Leg extension 5# 2x10 On airex 10# straight arm pulls On airex ball toss  On airex cone toe touches Side step on and off airex Gait outside around the parking [place] 2 rest breaks, very fatigued after this Ball b/n knees squeeze Blue tband clamshells Gait out back door, in the grass and pine [place]els uneven terrain and a curb some CGA but mostly SBA  03/03/24 Nustep level 5 x 6 minutes 40# resisted walking all directions 35# HS  curls 3x10 10# Leg extension 3x10 2.5# marches and hip abduction Ball b/n knees squeeze Step overs side to side, front to back Side stepping  Walking ball toss Practiced stairs step over step, needed cues and hand rail  03/01/24 Nustep level 5 x 6 minutes Resisted gait 30# fwd and backward 3# marches and abduction 35# HS curls 5# Leg extension Walking ball toss Sit to stand with weighted ball reach Ball b/n knees squeeze Side step on and off airex CGA needed  02/25/24 Nustep level 5 x 6 minutes Leg curls 35# 3x10 Leg extension 5# 3x10 Gait outside around the entire parking [place] with 2 rests 2.5# marches and abduction Feet on ball K2C, rotation, bridges and isometric abs Blue tband clamshells Ball b/n knees squeeze  02/23/24 Nustep level 5 x 6 minutes LEg curls 35# 3x10 Leg extension 5# 3x10 2.5# marches 2.5# extension Gait outside 1/2 way around the parking [place] short standing break and then through the grass to the front door On airex ball toss Side stepping over object Forward and back stepping over object Walking in clinic without device, forward, back ward and side stepping Education and discussion on multi tasking and how when he  does this he tends to be off balance, gave instructions to stop walking prior to doing another task  02/18/24 Leg curls 25# 3x10 Leg ext 5# 3x10 Gait outside 1/2 parking [place] with 1 rest break 2.5# MArches 2.5# abduction Side stepping backward walking TUG without device 16 seconds   02/16/24 Gait outside 1/2 way around the back parking [place] with 2 rest breaks standing CGA for curbs Hs curls 35# 3x10 Leg ext 5# 3x10 Nustep level 5 x 6 minutes 2.5# MArches 2.5# hip abduction Sit to stand no hands with a raised surface  02/11/24 Nustep level 5 x 7 minutes Gait outside with SPC around a/2 the parking [place], CGA for curbs and 2 LOB but able to self right, then 2 rest breaks due to fatigue LEg curls 25# 2 x12 LEg ext 5# 2x12 TUG 20 seconds with SPC 2.5# marches, abduction and extension 2x10 each Feet on ball K2C, rotation, small bridge, isometric abs Blue tband clamshells Ball b/n knees squeeze  PATIENT EDUCATION:  Education details: POC/Reviewed HEP Person educated: Patient Education method: Explanation Education comprehension: verbalized understanding  HOME EXERCISE PROGRAM: From hospital   ASSESSMENT:  CLINICAL IMPRESSION: We went on our longest walk yet, he did need 3 rest breaks and was very short of breath when we got back to the building.  Did discuss safety and building strength, endurance and independence  Patient is a 89 y.o. male who was seen today for physical therapy evaluation and treatment for s/p left THA posterior approach.   He was having significant pain prior to the surgery so he had been limited with his ability.  He reports that for the most part he is without pain and only takes Tylenol .  He really struggles to get up from sitting, could not complete the 5XSTS test due to difficulty and pain.    OBJECTIVE IMPAIRMENTS: Abnormal gait, cardiopulmonary status limiting activity, decreased activity tolerance, decreased balance, decreased endurance,  decreased mobility, difficulty walking, decreased ROM, decreased strength, increased edema, increased muscle spasms, impaired flexibility, postural dysfunction, and pain.   ACTIVITY LIMITATIONS: carrying, lifting, sitting, standing, squatting, sleeping, stairs, transfers, bed mobility, bathing, and locomotion level  PARTICIPATION LIMITATIONS: cleaning, laundry, driving, shopping, community activity, and yard work  PERSONAL FACTORS: Age, Fitness, Past/current experiences, 1 comorbidity:  ,  and 3+ comorbidities: as above are also affecting patient's functional outcome.   REHAB POTENTIAL: Good  CLINICAL DECISION MAKING: Evolving/moderate complexity  EVALUATION COMPLEXITY: Moderate   GOALS: Goals reviewed with patient? Yes  SHORT TERM GOALS: Target date: 02/25/24 Independent with initial HEP Baseline: Goal status:met 02/11/24   LONG TERM GOALS: Target date: 04/26/24  Independent with advanced HEP Baseline:  Goal status: progressing 03/10/24  2.  Go up and down stairs step over step Baseline:  Goal status: progressing 03/22/24  3.  Get up from sitting without hands Baseline:  Goal status: progressing 03/10/24  4.  Decrease TUG time to 15 seconds Baseline: 32 seconds Goal status: met 04/01/24  5.  Walk without device or SPC for all distances Baseline:  Goal status: met 02/25/24  6.  Walk 400 feet for Baseline:  Goal status:ongoing 03/10/24   PLAN:  PT FREQUENCY: 2x/week  PT DURATION: 12 weeks  PLANNED INTERVENTIONS: 97164- PT Re-evaluation, 97110-Therapeutic exercises, 97530- Therapeutic activity, 97112- Neuromuscular re-education, 97535- Self Care, [id number]- Manual therapy, [phone]- Gait training, Patient/Family education, Balance training, Stair training, and Cryotherapy  PLAN FOR NEXT SESSION: advance walking, endurance, strength and function,    Tomeko Scoville W, PT 03/24/2024, 10:59 AM

## 2024-03-29 ENCOUNTER — Other Ambulatory Visit: Payer: Self-pay | Admitting: Cardiology

## 2024-04-05 ENCOUNTER — Ambulatory Visit: Admitting: Physical Therapy

## 2024-04-05 ENCOUNTER — Telehealth: Payer: Self-pay

## 2024-04-05 NOTE — Telephone Encounter (Signed)
 Called to check in with patient, who had LAAO on 05/30/2022. The patient reports doing well with no issues.  The patient understands to call with questions or concerns.

## 2024-04-07 ENCOUNTER — Ambulatory Visit: Attending: Family Medicine | Admitting: Physical Therapy

## 2024-04-07 ENCOUNTER — Encounter: Payer: Self-pay | Admitting: Physical Therapy

## 2024-04-07 DIAGNOSIS — M6281 Muscle weakness (generalized): Secondary | ICD-10-CM | POA: Insufficient documentation

## 2024-04-07 DIAGNOSIS — R252 Cramp and spasm: Secondary | ICD-10-CM | POA: Insufficient documentation

## 2024-04-07 DIAGNOSIS — M25552 Pain in left hip: Secondary | ICD-10-CM | POA: Diagnosis present

## 2024-04-07 DIAGNOSIS — R2689 Other abnormalities of gait and mobility: Secondary | ICD-10-CM | POA: Diagnosis present

## 2024-04-07 DIAGNOSIS — R262 Difficulty in walking, not elsewhere classified: Secondary | ICD-10-CM | POA: Insufficient documentation

## 2024-04-07 DIAGNOSIS — R2681 Unsteadiness on feet: Secondary | ICD-10-CM | POA: Diagnosis present

## 2024-04-07 NOTE — Therapy (Signed)
 OUTPATIENT PHYSICAL THERAPY LOWER EXTREMITY TREATMENT    Patient Name: Connor [name] MRN: [id number] DOB:1934/02/28, 89 y.o., male Today's Date: 04/07/2024  END OF SESSION:  PT End of Session - 04/07/24 1016     Visit Number 19    Number of Visits 24    Date for Recertification  05/05/24    Authorization Type [employer]    PT Start Time 1013    PT Stop Time 1058    PT Time Calculation (min) 45 min    Activity Tolerance Patient tolerated treatment well    Behavior During Therapy [facility] for tasks assessed/performed          Past Medical History:  Diagnosis Date   Allergy 1985   Anemia    Benign localized prostatic hyperplasia with lower urinary tract symptoms (LUTS)    urologist-- dr matilda   Cancer [facility]) 07/2018   prostate   Cataract 12/2012   CHF (congestive heart failure) (HCC) ?   Chronic anticoagulation    Xarelto  for afib   Chronic constipation    Chronic cystitis    Chronic kidney disease    Clotting disorder    GERD (gastroesophageal reflux disease)    Gross hematuria    Heart murmur ?   History of DVT of lower extremity 06/08/2010   right femoral dvt dx post op total hip 05-08-2010   History of gastric ulcer    remote   History of kidney stones    one stone. remote hx   Hypercholesteremia    Hyperlipidemia ?   Hypertension    Dx by Dr. Salena Skill around age  31    Macular degeneration    dry in both eyes,  no injections  sees Dr. Alvia   NICM (nonischemic cardiomyopathy) [facility])    per echo 06-11-2017,  ef 45-50% with diffuse hypokinesis,  G1DD   OSA on CPAP 2017   compliant with CPAP.  managed by Dr Jude.   Osteoarthritis    Other urethral stricture, male, meatal    chronic;  pt self dilates   Paroxysmal atrial fibrillation [facility]) cardiologist-- dr kelsie   first dx 2009/   a. documented by Dr Comer on office EKG 9/12. b. maintained on Tikosyn  and Xarelto . c. s/p DCCV 's.  d.  x3  EP w/ ablation atrial fib last one 12-05-2016   Pre-diabetes     Premature ventricular contraction    Presence of Watchman left atrial appendage closure device 05/30/2022   Watchman FLX 24mm by Dr. Cindie   PVC's (premature ventricular contractions)    RA (rheumatoid arthritis) [facility])    rheumatologist--- Dr LORELEI Jacob,  treated with Humira   RBBB (right bundle branch block)    Hx of a fib, ablation Aug 2018   PVCs,   Pt had Watchman placed 05-30-2022   Rheumatoid arthritis (HCC)    Sleep apnea    Ulcer    Vitamin D  deficiency    Wears hearing aid in both ears    Past Surgical History:  Procedure Laterality Date   ABLATION OF DYSRHYTHMIC FOCUS  08/29/2015   ATRIAL FIBRILLATION ABLATION  12/05/2016   ATRIAL FIBRILLATION ABLATION N/A 12/05/2016   Procedure: Atrial Fibrillation Ablation;  Surgeon: Kelsie Lynwood, MD;  Location: [facility] INVASIVE CV LAB;  Service: Cardiovascular;  Laterality: N/A;   ATRIAL FIBRILLATION ABLATION N/A 02/29/2020   Procedure: ATRIAL FIBRILLATION ABLATION;  Surgeon: Kelsie Lynwood, MD;  Location: [facility] INVASIVE CV LAB;  Service: Cardiovascular;  Laterality: N/A;  ATRIAL FLUTTER ABLATION  09/2010   by MILUS EDWARDS Right 1990s   CARDIOVERSION N/A 10/28/2012   Procedure: CARDIOVERSION;  Surgeon: Lonni JONETTA Cash, MD;  Location: [facility] OR;  Service: Cardiovascular;  Laterality: N/A;   CATARACT EXTRACTION W/ INTRAOCULAR LENS  IMPLANT, BILATERAL  2019   CYSTOSCOPY WITH BIOPSY N/A 09/03/2018   Procedure: CYSTOSCOPY WITH FULGURATION CELESTA;  Surgeon: Matilda Senior, MD;  Location: [facility] ORS;  Service: Urology;  Laterality: N/A;  30 MINS   CYSTOSCOPY WITH FULGERATION N/A 01/23/2022   Procedure: CYSTOSCOPY WITH FULGERATION OF PROSTATIC URETHRA;  Surgeon: Matilda Senior, MD;  Location: [facility] ORS;  Service: Urology;  Laterality: N/A;  30 MINS   DENTAL SURGERY     ELECTROPHYSIOLOGIC STUDY N/A 08/29/2015   Procedure: Atrial Fibrillation Ablation;  Surgeon: Lynwood Rakers, MD;  Location: [facility] INVASIVE CV LAB;  Service: Cardiovascular;   Laterality: N/A;   EYE SURGERY     Cataracts   JOINT REPLACEMENT  1995, 1997   KNEE SURGERY Bilateral x3   1960s & 1970s   open   LEFT ATRIAL APPENDAGE OCCLUSION N/A 05/30/2022   Procedure: LEFT ATRIAL APPENDAGE OCCLUSION;  Surgeon: Cindie Ole DASEN, MD;  Location: [facility] INVASIVE CV LAB;  Service: Cardiovascular;  Laterality: N/A;   LEFT HEART CATHETERIZATION WITH CORONARY ANGIOGRAM N/A 06/07/2011   Procedure: LEFT HEART CATHETERIZATION WITH CORONARY ANGIOGRAM;  Surgeon: Peter M Jordan, MD;  Location: [facility] CATH LAB;  Service: Cardiovascular;  Laterality: N/A;    Normal coronary arteries,  low normal LVF   TEE WITHOUT CARDIOVERSION N/A 05/30/2022   Procedure: TRANSESOPHAGEAL ECHOCARDIOGRAM (TEE);  Surgeon: Cindie Ole DASEN, MD;  Location: [facility] INVASIVE CV LAB;  Service: Cardiovascular;  Laterality: N/A;   TONSILLECTOMY AND ADENOIDECTOMY  age 58   TOTAL HIP ARTHROPLASTY Right 06/04/10    dr jane  @[facility]    TOTAL HIP ARTHROPLASTY Left 01/07/2024   Procedure: ARTHROPLASTY, HIP, TOTAL,POSTERIOR APPROACH;  Surgeon: Edna Toribio LABOR, MD;  Location: [facility] ORS;  Service: Orthopedics;  Laterality: Left;   TOTAL KNEE ARTHROPLASTY Bilateral right 1995;  left 1997   TRANSURETHRAL RESECTION OF PROSTATE  06-29-2002   dr humphries  @[facility]    TRANSURETHRAL RESECTION OF PROSTATE N/A 07/06/2018   Procedure: TRANSURETHRAL RESECTION OF THE PROSTATE (TURP);  Surgeon: Matilda Senior, MD;  Location: [facility];  Service: Urology;  Laterality: N/A;  45 MINS   Patient Active Problem List   Diagnosis Date Noted   Primary osteoarthritis of left hip 01/07/2024   Pain due to onychomycosis of toenails of both feet 07/03/2023   Atrial fibrillation (HCC) 05/30/2022   Presence of Watchman left atrial appendage closure device 05/30/2022   Hematuria 01/04/2022   Gross hematuria 01/04/2022   Secondary hypercoagulable state 03/28/2020   Malignant neoplasm of prostate (HCC) 09/11/2018   Enlarged prostate with  urinary obstruction 07/06/2018   Gastrocnemius strain, right, initial encounter 12/10/2017   Baker's cyst of knee, right 12/10/2017   Obesity 03/24/2017   Paroxysmal atrial fibrillation (HCC) 12/05/2016   Near syncope 04/20/2016   Leg hematoma, left, initial encounter 04/20/2016   Bleeding    Ecchymosis    Left hamstring muscle strain    Muscle tear    RBBB 08/30/2015   Chronic systolic dysfunction of left ventricle 07/11/2014   OSA (obstructive sleep apnea) 04/07/2013   Multinodular goiter 01/20/2013   Cervical spondylosis without myelopathy 01/18/2013   Left ventricular dysfunction 12/17/2011   Low back pain 09/11/2011   Fatigue 05/22/2011   PVC (premature ventricular  contraction) 01/18/2011   Persistent atrial fibrillation (HCC) 01/12/2011   Hyperlipidemia 08/02/2010   Hypertension 08/02/2010   Osteoarthritis 08/02/2010   BPH (benign prostatic hyperplasia) 08/02/2010   GERD (gastroesophageal reflux disease) 08/02/2010   h/o Atrial flutter (HCC) s/p ablation 2012     PCP: Copland, MD  REFERRING PROVIDER: Edna, MD  REFERRING DIAG: S/P Left THA posterior  THERAPY DIAG:  Pain in left hip  Muscle weakness (generalized)  Unsteadiness on feet  Difficulty in walking, not elsewhere classified  Rationale for Evaluation and Treatment: Rehabilitation  ONSET DATE: 01/07/24  SUBJECTIVE:   SUBJECTIVE STATEMENT: Doing okay, I am tired and sore I was at the beach over the weekend  Patient underwent left posterior THA on 01/07/24, he has had Home PT until last week.  He reports that he has very little pain, just fatigue.  He reports that he has been doing the HEP given from the hospital.    PERTINENT HISTORY: A fib, prostate CA, LBP. HLD, HTN, OA, RA, bilateral TKA PAIN:  Are you having pain? Yes: NPRS scale: 0/10 Pain location: left hip/back/thigh Pain description: spasm Aggravating factors: getting up from the chair pain up to 5/10 Relieving factors: rest, Tylenol ,  can have no pain  PRECAUTIONS: Posterior hip  RED FLAGS: None   WEIGHT BEARING RESTRICTIONS: No  FALLS:  Has patient fallen in last 6 months? No  LIVING ENVIRONMENT: Lives with: lives with their family and lives with their spouse Lives in: House/apartment Stairs: Yes: Internal: 14 steps; can reach both Has following equipment at home: Vannie - 2 wheeled  OCCUPATION: retired  PLOF: Independent and shopping, no device, some yard work  PATIENT GOALS: I want to walk without any device, I want to be able to do yard work  NEXT MD VISIT: October 16  OBJECTIVE:  Note: Objective measures were completed at Evaluation unless otherwise noted.  DIAGNOSTIC FINDINGS: N/A   COGNITION: Overall cognitive status: Within functional limits for tasks assessed     SENSATION: WFL  EDEMA:  Has visible edema in the left thigh and knee  MUSCLE LENGTH: Tight calves and HS  POSTURE: rounded shoulders, forward head, and decreased lumbar lordosis  PALPATION: Mild tenderness in the left thigh and hip, buttock  LOWER EXTREMITY ROM:  Active ROM Right eval Left eval  Hip flexion  45  Hip extension    Hip abduction  10  Hip adduction    Hip internal rotation    Hip external rotation    Knee flexion    Knee extension    Ankle dorsiflexion    Ankle plantarflexion    Ankle inversion    Ankle eversion     (Blank rows = not tested)  LOWER EXTREMITY MMT:  MMT Right eval Left eval  Hip flexion  3+  Hip extension    Hip abduction  3+  Hip adduction    Hip internal rotation    Hip external rotation    Knee flexion  4-  Knee extension  4-  Ankle dorsiflexion    Ankle plantarflexion    Ankle inversion    Ankle eversion     (Blank rows = not tested)  FUNCTIONAL TESTS:  5 times sit to stand: unable to do without hands, had to use the arm rests and could only complete 3 due to pain and fatigue took 32 seconds to do 3 of the 5 Timed up and go (TUG): 32 seconds with FWW.  02/11/24  20 seconds with SPC 3 minute  walk test: 200 feet with the FWW  GAIT: Distance walked: 200 feet Assistive device utilized: Walker - 2 wheeled Level of assistance: SBA Comments: slow, left toe slightly turns in                                                                                                                                TREATMENT DATE:  04/07/24 Nustep level 5 x 7 minutes Leg curls 35# Leg ext 5# 5# marches, hip abduction and extension in Pbars Sit to stand with weight Side stepping Fast walking 4 laps Back to wall back to wall ball OHP working on posture  03/24/24 Leg curls 35# 3x10 Leg extension 5# 3x10 Gait outside long walk all the way around the back building, with a seated rest break at a picnic table, walking up the hill had to use the hand rail on the side walk, standing rest break toward the top, then another standing rest break before we made it back to the building Discussion on building strength and endurance as well as being safe, using the SPC on long walks or when outside , not using the cane when feeling good and when inside  03/22/24 Leg curls 35# LEg ext 5# Nustep level 5 x 6 minutes Gait outside with SPC down the hill around [place] at the bottom and then back with 3-4 rest breaks [place] 2 and 3 blocks step overs On airex ball catch some difficulty with LOB Side step on and off airex Obstacle course step over 1/2 and full bolster and then step up and down a 6 step with SPC and CGA  03/17/24 Leg curls 35# Leg extension 5# On airex 10# straight arm pulls More square 2 blocks step overs all directions Sit to stand with weighted ball 2x5 Nustep level 5 x 7 minutes 6  step ups holding on needs cues to push with the left leg and extend the knee 40# resisted gait all directions with CGA  03/15/24 Nustep level 5 x 8 minutes Leg curls 35# Leg ext 5# On airex ball toss On airex cone toe touch with SPC Side step on and off aires 40#  resisted walking all directions 8 toe clears More square 2 blocks step overs front to back and side to side  03/10/24 Leg curls 35# 3x10 Leg extension 5# 3x10 Gait around the back parking [place] and then around to the front of the building  3 rest breaks standing Nustep level 5 x 6 minutes 2.5# marches 2.5# hip abduction Side stepping on airex balance beam in pbars no hands CGA On airex ball toss and bounce  03/08/24 Nustep level 5 x 6 minutes Leg curls 35# 2x10 Leg extension 5# 2x10 On airex 10# straight arm pulls On airex ball toss  On airex cone toe touches Side step on and off airex Gait outside around the parking [place] 2 rest breaks, very fatigued after this Ball b/n knees squeeze [employer]  tband clamshells Gait out back door, in the grass and pine needels uneven terrain and a curb some CGA but mostly SBA  03/03/24 Nustep level 5 x 6 minutes 40# resisted walking all directions 35# HS curls 3x10 10# Leg extension 3x10 2.5# marches and hip abduction Ball b/n knees squeeze Step overs side to side, front to back Side stepping  Walking ball toss Practiced stairs step over step, needed cues and hand rail  03/01/24 Nustep level 5 x 6 minutes Resisted gait 30# fwd and backward 3# marches and abduction 35# HS curls 5# Leg extension Walking ball toss Sit to stand with weighted ball reach Ball b/n knees squeeze Side step on and off airex CGA needed  02/25/24 Nustep level 5 x 6 minutes Leg curls 35# 3x10 Leg extension 5# 3x10 Gait outside around the entire parking [place] with 2 rests 2.5# marches and abduction Feet on ball K2C, rotation, bridges and isometric abs Blue tband clamshells Ball b/n knees squeeze  02/23/24 Nustep level 5 x 6 minutes LEg curls 35# 3x10 Leg extension 5# 3x10 2.5# marches 2.5# extension Gait outside 1/2 way around the parking [place] short standing break and then through the grass to the front door On airex ball toss Side stepping over  object Forward and back stepping over object Walking in clinic without device, forward, back ward and side stepping Education and discussion on multi tasking and how when he does this he tends to be off balance, gave instructions to stop walking prior to doing another task  02/18/24 Leg curls 25# 3x10 Leg ext 5# 3x10 Gait outside 1/2 parking [place] with 1 rest break 2.5# MArches 2.5# abduction Side stepping backward walking TUG without device 16 seconds  PATIENT EDUCATION:  Education details: POC/Reviewed HEP Person educated: Patient Education method: Explanation Education comprehension: verbalized understanding  HOME EXERCISE PROGRAM: From hospital   ASSESSMENT:  CLINICAL IMPRESSION: I worked with him on posture as he has very fwd head and rounded shoulders.  Had him try holding posture.  Worked on strength and balance as well as speed of gait, with the fast walking he became SOB  Patient is a 89 y.o. male who was seen today for physical therapy evaluation and treatment for s/p left THA posterior approach.   He was having significant pain prior to the surgery so he had been limited with his ability.  He reports that for the most part he is without pain and only takes Tylenol .  He really struggles to get up from sitting, could not complete the 5XSTS test due to difficulty and pain.    OBJECTIVE IMPAIRMENTS: Abnormal gait, cardiopulmonary status limiting activity, decreased activity tolerance, decreased balance, decreased endurance, decreased mobility, difficulty walking, decreased ROM, decreased strength, increased edema, increased muscle spasms, impaired flexibility, postural dysfunction, and pain.   ACTIVITY LIMITATIONS: carrying, lifting, sitting, standing, squatting, sleeping, stairs, transfers, bed mobility, bathing, and locomotion level  PARTICIPATION LIMITATIONS: cleaning, laundry, driving, shopping, community activity, and yard work  PERSONAL FACTORS: Age, Fitness,  Past/current experiences, 1 comorbidity:  , and 3+ comorbidities: as above are also affecting patient's functional outcome.   REHAB POTENTIAL: Good  CLINICAL DECISION MAKING: Evolving/moderate complexity  EVALUATION COMPLEXITY: Moderate   GOALS: Goals reviewed with patient? Yes  SHORT TERM GOALS: Target date: 02/25/24 Independent with initial HEP Baseline: Goal status:met 02/11/24   LONG TERM GOALS: Target date: 04/26/24  Independent with advanced HEP Baseline:  Goal status: progressing 03/10/24  2.  Go up and down stairs step over step  Baseline:  Goal status: progressing 03/22/24  3.  Get up from sitting without hands Baseline:  Goal status: progressing 03/10/24  4.  Decrease TUG time to 15 seconds Baseline: 32 seconds Goal status: met 04/01/24  5.  Walk without device or SPC for all distances Baseline:  Goal status: met 02/25/24  6.  Walk 400 feet for Baseline:  Goal status:ongoing 03/10/24   PLAN:  PT FREQUENCY: 2x/week  PT DURATION: 12 weeks  PLANNED INTERVENTIONS: 97164- PT Re-evaluation, 97110-Therapeutic exercises, 97530- Therapeutic activity, 97112- Neuromuscular re-education, 97535- Self Care, [id number]- Manual therapy, [phone]- Gait training, Patient/Family education, Balance training, Stair training, and Cryotherapy  PLAN FOR NEXT SESSION: advance walking, endurance, strength and function, assess TUG   Mistey Hoffert W, PT 04/07/2024, 10:18 AM

## 2024-04-12 ENCOUNTER — Encounter: Payer: Self-pay | Admitting: Physical Therapy

## 2024-04-12 ENCOUNTER — Ambulatory Visit: Admitting: Physical Therapy

## 2024-04-12 DIAGNOSIS — R262 Difficulty in walking, not elsewhere classified: Secondary | ICD-10-CM

## 2024-04-12 DIAGNOSIS — R2681 Unsteadiness on feet: Secondary | ICD-10-CM

## 2024-04-12 DIAGNOSIS — R252 Cramp and spasm: Secondary | ICD-10-CM

## 2024-04-12 DIAGNOSIS — M25552 Pain in left hip: Secondary | ICD-10-CM | POA: Diagnosis not present

## 2024-04-12 DIAGNOSIS — R2689 Other abnormalities of gait and mobility: Secondary | ICD-10-CM

## 2024-04-12 DIAGNOSIS — M6281 Muscle weakness (generalized): Secondary | ICD-10-CM

## 2024-04-12 NOTE — Therapy (Signed)
 OUTPATIENT PHYSICAL THERAPY LOWER EXTREMITY TREATMENT  Progress Note Reporting Period 9 to 04/12/24 for visits 11-20  See note below for Objective Data and Assessment of Progress/Goals.      Patient Name: Connor [name] MRN: [id number] DOB:July 16, 1933, 89 y.o., male Today's Date: 04/12/2024  END OF SESSION:  PT End of Session - 04/12/24 1307     Visit Number 20    Number of Visits 24    Date for Recertification  05/05/24    Authorization Type [employer] 21 of 24    PT Start Time 1307    PT Stop Time 1356    PT Time Calculation (min) 49 min    Activity Tolerance Patient tolerated treatment well    Behavior During Therapy [facility] for tasks assessed/performed          Past Medical History:  Diagnosis Date   Allergy 1985   Anemia    Benign localized prostatic hyperplasia with lower urinary tract symptoms (LUTS)    urologist-- dr matilda   Cancer [facility]) 07/2018   prostate   Cataract 12/2012   CHF (congestive heart failure) (HCC) ?   Chronic anticoagulation    Xarelto  for afib   Chronic constipation    Chronic cystitis    Chronic kidney disease    Clotting disorder    GERD (gastroesophageal reflux disease)    Gross hematuria    Heart murmur ?   History of DVT of lower extremity 06/08/2010   right femoral dvt dx post op total hip 05-08-2010   History of gastric ulcer    remote   History of kidney stones    one stone. remote hx   Hypercholesteremia    Hyperlipidemia ?   Hypertension    Dx by Dr. Salena Skill around age  79    Macular degeneration    dry in both eyes,  no injections  sees Dr. Alvia   NICM (nonischemic cardiomyopathy) [facility])    per echo 06-11-2017,  ef 45-50% with diffuse hypokinesis,  G1DD   OSA on CPAP 2017   compliant with CPAP.  managed by Dr Jude.   Osteoarthritis    Other urethral stricture, male, meatal    chronic;  pt self dilates   Paroxysmal atrial fibrillation [facility]) cardiologist-- dr kelsie   first dx 2009/   a. documented by Dr Comer on  office EKG 9/12. b. maintained on Tikosyn  and Xarelto . c. s/p DCCV 's.  d.  x3  EP w/ ablation atrial fib last one 12-05-2016   Pre-diabetes    Premature ventricular contraction    Presence of Watchman left atrial appendage closure device 05/30/2022   Watchman FLX 24mm by Dr. Cindie   PVC's (premature ventricular contractions)    RA (rheumatoid arthritis) [facility])    rheumatologist--- Dr LORELEI Jacob,  treated with Humira   RBBB (right bundle branch block)    Hx of a fib, ablation Aug 2018   PVCs,   Pt had Watchman placed 05-30-2022   Rheumatoid arthritis (HCC)    Sleep apnea    Ulcer    Vitamin D  deficiency    Wears hearing aid in both ears    Past Surgical History:  Procedure Laterality Date   ABLATION OF DYSRHYTHMIC FOCUS  08/29/2015   ATRIAL FIBRILLATION ABLATION  12/05/2016   ATRIAL FIBRILLATION ABLATION N/A 12/05/2016   Procedure: Atrial Fibrillation Ablation;  Surgeon: Kelsie Lynwood, MD;  Location: [facility] INVASIVE CV LAB;  Service: Cardiovascular;  Laterality: N/A;   ATRIAL FIBRILLATION ABLATION  N/A 02/29/2020   Procedure: ATRIAL FIBRILLATION ABLATION;  Surgeon: Kelsie Agent, MD;  Location: [facility] INVASIVE CV LAB;  Service: Cardiovascular;  Laterality: N/A;   ATRIAL FLUTTER ABLATION  09/2010   by MILUS EDWARDS Right 1990s   CARDIOVERSION N/A 10/28/2012   Procedure: CARDIOVERSION;  Surgeon: Lonni JONETTA Cash, MD;  Location: [facility] OR;  Service: Cardiovascular;  Laterality: N/A;   CATARACT EXTRACTION W/ INTRAOCULAR LENS  IMPLANT, BILATERAL  2019   CYSTOSCOPY WITH BIOPSY N/A 09/03/2018   Procedure: CYSTOSCOPY WITH FULGURATION CELESTA;  Surgeon: Matilda Senior, MD;  Location: [facility] ORS;  Service: Urology;  Laterality: N/A;  30 MINS   CYSTOSCOPY WITH FULGERATION N/A 01/23/2022   Procedure: CYSTOSCOPY WITH FULGERATION OF PROSTATIC URETHRA;  Surgeon: Matilda Senior, MD;  Location: [facility] ORS;  Service: Urology;  Laterality: N/A;  30 MINS   DENTAL SURGERY     ELECTROPHYSIOLOGIC STUDY  N/A 08/29/2015   Procedure: Atrial Fibrillation Ablation;  Surgeon: Agent Kelsie, MD;  Location: [facility] INVASIVE CV LAB;  Service: Cardiovascular;  Laterality: N/A;   EYE SURGERY     Cataracts   JOINT REPLACEMENT  1995, 1997   KNEE SURGERY Bilateral x3   1960s & 1970s   open   LEFT ATRIAL APPENDAGE OCCLUSION N/A 05/30/2022   Procedure: LEFT ATRIAL APPENDAGE OCCLUSION;  Surgeon: Cindie Ole DASEN, MD;  Location: [facility] INVASIVE CV LAB;  Service: Cardiovascular;  Laterality: N/A;   LEFT HEART CATHETERIZATION WITH CORONARY ANGIOGRAM N/A 06/07/2011   Procedure: LEFT HEART CATHETERIZATION WITH CORONARY ANGIOGRAM;  Surgeon: Peter M Jordan, MD;  Location: [facility] CATH LAB;  Service: Cardiovascular;  Laterality: N/A;    Normal coronary arteries,  low normal LVF   TEE WITHOUT CARDIOVERSION N/A 05/30/2022   Procedure: TRANSESOPHAGEAL ECHOCARDIOGRAM (TEE);  Surgeon: Cindie Ole DASEN, MD;  Location: [facility] INVASIVE CV LAB;  Service: Cardiovascular;  Laterality: N/A;   TONSILLECTOMY AND ADENOIDECTOMY  age 89   TOTAL HIP ARTHROPLASTY Right 06/04/10    dr jane  @[facility]    TOTAL HIP ARTHROPLASTY Left 01/07/2024   Procedure: ARTHROPLASTY, HIP, TOTAL,POSTERIOR APPROACH;  Surgeon: Edna Toribio LABOR, MD;  Location: [facility] ORS;  Service: Orthopedics;  Laterality: Left;   TOTAL KNEE ARTHROPLASTY Bilateral right 1995;  left 1997   TRANSURETHRAL RESECTION OF PROSTATE  06-29-2002   dr humphries  @[facility]    TRANSURETHRAL RESECTION OF PROSTATE N/A 07/06/2018   Procedure: TRANSURETHRAL RESECTION OF THE PROSTATE (TURP);  Surgeon: Matilda Senior, MD;  Location: [facility];  Service: Urology;  Laterality: N/A;  45 MINS   Patient Active Problem List   Diagnosis Date Noted   Primary osteoarthritis of left hip 01/07/2024   Pain due to onychomycosis of toenails of both feet 07/03/2023   Atrial fibrillation (HCC) 05/30/2022   Presence of Watchman left atrial appendage closure device 05/30/2022   Hematuria 01/04/2022   Gross  hematuria 01/04/2022   Secondary hypercoagulable state 03/28/2020   Malignant neoplasm of prostate (HCC) 09/11/2018   Enlarged prostate with urinary obstruction 07/06/2018   Gastrocnemius strain, right, initial encounter 12/10/2017   Baker's cyst of knee, right 12/10/2017   Obesity 03/24/2017   Paroxysmal atrial fibrillation (HCC) 12/05/2016   Near syncope 04/20/2016   Leg hematoma, left, initial encounter 04/20/2016   Bleeding    Ecchymosis    Left hamstring muscle strain    Muscle tear    RBBB 08/30/2015   Chronic systolic dysfunction of left ventricle 07/11/2014   OSA (obstructive sleep apnea) 04/07/2013   Multinodular goiter 01/20/2013  Cervical spondylosis without myelopathy 01/18/2013   Left ventricular dysfunction 12/17/2011   Low back pain 09/11/2011   Fatigue 05/22/2011   PVC (premature ventricular contraction) 01/18/2011   Persistent atrial fibrillation (HCC) 01/12/2011   Hyperlipidemia 08/02/2010   Hypertension 08/02/2010   Osteoarthritis 08/02/2010   BPH (benign prostatic hyperplasia) 08/02/2010   GERD (gastroesophageal reflux disease) 08/02/2010   h/o Atrial flutter (HCC) s/p ablation 2012     PCP: Copland, MD  REFERRING PROVIDER: Edna, MD  REFERRING DIAG: S/P Left THA posterior  THERAPY DIAG:  Pain in left hip  Muscle weakness (generalized)  Unsteadiness on feet  Difficulty in walking, not elsewhere classified  Other abnormalities of gait and mobility  Cramp and spasm  Rationale for Evaluation and Treatment: Rehabilitation  ONSET DATE: 01/07/24  SUBJECTIVE:   SUBJECTIVE STATEMENT: I went to a funeral this weekend and was standing for a long time and my legs were jelly for that day and the next  Patient underwent left posterior THA on 01/07/24, he has had Home PT until last week.  He reports that he has very little pain, just fatigue.  He reports that he has been doing the HEP given from the hospital.    PERTINENT HISTORY: A fib, prostate  CA, LBP. HLD, HTN, OA, RA, bilateral TKA PAIN:  Are you having pain? Yes: NPRS scale: 0/10 Pain location: left hip/back/thigh Pain description: spasm Aggravating factors: getting up from the chair pain up to 5/10 Relieving factors: rest, Tylenol , can have no pain  PRECAUTIONS: Posterior hip  RED FLAGS: None   WEIGHT BEARING RESTRICTIONS: No  FALLS:  Has patient fallen in last 6 months? No  LIVING ENVIRONMENT: Lives with: lives with their family and lives with their spouse Lives in: House/apartment Stairs: Yes: Internal: 14 steps; can reach both Has following equipment at home: Vannie - 2 wheeled  OCCUPATION: retired  PLOF: Independent and shopping, no device, some yard work  PATIENT GOALS: I want to walk without any device, I want to be able to do yard work  NEXT MD VISIT: October 16  OBJECTIVE:  Note: Objective measures were completed at Evaluation unless otherwise noted.  DIAGNOSTIC FINDINGS: N/A   COGNITION: Overall cognitive status: Within functional limits for tasks assessed     SENSATION: WFL  EDEMA:  Has visible edema in the left thigh and knee  MUSCLE LENGTH: Tight calves and HS  POSTURE: rounded shoulders, forward head, and decreased lumbar lordosis  PALPATION: Mild tenderness in the left thigh and hip, buttock  LOWER EXTREMITY ROM:  Active ROM Right eval Left eval  Hip flexion  45  Hip extension    Hip abduction  10  Hip adduction    Hip internal rotation    Hip external rotation    Knee flexion    Knee extension    Ankle dorsiflexion    Ankle plantarflexion    Ankle inversion    Ankle eversion     (Blank rows = not tested)  LOWER EXTREMITY MMT:  MMT Right eval Left eval  Hip flexion  3+  Hip extension    Hip abduction  3+  Hip adduction    Hip internal rotation    Hip external rotation    Knee flexion  4-  Knee extension  4-  Ankle dorsiflexion    Ankle plantarflexion    Ankle inversion    Ankle eversion     (Blank  rows = not tested)  FUNCTIONAL TESTS:  5 times sit to stand:  unable to do without hands, had to use the arm rests and could only complete 3 due to pain and fatigue took 32 seconds to do 3 of the 5 Timed up and go (TUG): 32 seconds with FWW.  02/11/24 20 seconds with SPC 3 minute walk test: 200 feet with the FWW  GAIT: Distance walked: 200 feet Assistive device utilized: Walker - 2 wheeled Level of assistance: SBA Comments: slow, left toe slightly turns in                                                                                                                                TREATMENT DATE:  04/12/24 Leg curls 35# 3x10 Leg extension 5# 2.5# marches 2.5# hip abduction Ball squeeze Nustep level 6 x 6 minutes On airex 10# straight arm pulls More square 2blocks high On airex ball toss and bounce Fast walk 2 laps  04/07/24 Nustep level 5 x 7 minutes Leg curls 35# Leg ext 5# 5# marches, hip abduction and extension in Pbars Sit to stand with weight Side stepping Fast walking 4 laps Back to wall back to wall ball OHP working on posture  03/24/24 Leg curls 35# 3x10 Leg extension 5# 3x10 Gait outside long walk all the way around the back building, with a seated rest break at a picnic table, walking up the hill had to use the hand rail on the side walk, standing rest break toward the top, then another standing rest break before we made it back to the building Discussion on building strength and endurance as well as being safe, using the SPC on long walks or when outside , not using the cane when feeling good and when inside  03/22/24 Leg curls 35# LEg ext 5# Nustep level 5 x 6 minutes Gait outside with SPC down the hill around [place] at the bottom and then back with 3-4 rest breaks [place] 2 and 3 blocks step overs On airex ball catch some difficulty with LOB Side step on and off airex Obstacle course step over 1/2 and full bolster and then step up and down a 6 step  with SPC and CGA  03/17/24 Leg curls 35# Leg extension 5# On airex 10# straight arm pulls More square 2 blocks step overs all directions Sit to stand with weighted ball 2x5 Nustep level 5 x 7 minutes 6  step ups holding on needs cues to push with the left leg and extend the knee 40# resisted gait all directions with CGA  03/15/24 Nustep level 5 x 8 minutes Leg curls 35# Leg ext 5# On airex ball toss On airex cone toe touch with SPC Side step on and off aires 40# resisted walking all directions 8 toe clears More square 2 blocks step overs front to back and side to side  03/10/24 Leg curls 35# 3x10 Leg extension 5# 3x10 Gait around the back parking [place] and then around to the front of the building  3 rest breaks standing Nustep level 5 x 6 minutes 2.5# marches 2.5# hip abduction Side stepping on airex balance beam in pbars no hands CGA On airex ball toss and bounce  03/08/24 Nustep level 5 x 6 minutes Leg curls 35# 2x10 Leg extension 5# 2x10 On airex 10# straight arm pulls On airex ball toss  On airex cone toe touches Side step on and off airex Gait outside around the parking [place] 2 rest breaks, very fatigued after this Ball b/n knees squeeze Blue tband clamshells Gait out back door, in the grass and pine needels uneven terrain and a curb some CGA but mostly SBA  03/03/24 Nustep level 5 x 6 minutes 40# resisted walking all directions 35# HS curls 3x10 10# Leg extension 3x10 2.5# marches and hip abduction Ball b/n knees squeeze Step overs side to side, front to back Side stepping  Walking ball toss Practiced stairs step over step, needed cues and hand rail  03/01/24 Nustep level 5 x 6 minutes Resisted gait 30# fwd and backward 3# marches and abduction 35# HS curls 5# Leg extension Walking ball toss Sit to stand with weighted ball reach Ball b/n knees squeeze Side step on and off airex CGA needed  02/25/24 Nustep level 5 x 6 minutes Leg curls 35#  3x10 Leg extension 5# 3x10 Gait outside around the entire parking [place] with 2 rests 2.5# marches and abduction Feet on ball K2C, rotation, bridges and isometric abs Blue tband clamshells Ball b/n knees squeeze  02/23/24 Nustep level 5 x 6 minutes LEg curls 35# 3x10 Leg extension 5# 3x10 2.5# marches 2.5# extension Gait outside 1/2 way around the parking [place] short standing break and then through the grass to the front door On airex ball toss Side stepping over object Forward and back stepping over object Walking in clinic without device, forward, back ward and side stepping Education and discussion on multi tasking and how when he does this he tends to be off balance, gave instructions to stop walking prior to doing another task  02/18/24 Leg curls 25# 3x10 Leg ext 5# 3x10 Gait outside 1/2 parking [place] with 1 rest break 2.5# MArches 2.5# abduction Side stepping backward walking TUG without device 16 seconds  PATIENT EDUCATION:  Education details: POC/Reviewed HEP Person educated: Patient Education method: Explanation Education comprehension: verbalized understanding  HOME EXERCISE PROGRAM: From hospital   ASSESSMENT:  CLINICAL IMPRESSION: Continued to work on strength, balance and gait, he does fatigue quickly, really struggles with the step overs needing CGA  Patient is a 89 y.o. male who was seen today for physical therapy evaluation and treatment for s/p left THA posterior approach.   He was having significant pain prior to the surgery so he had been limited with his ability.  He reports that for the most part he is without pain and only takes Tylenol .  He really struggles to get up from sitting, could not complete the 5XSTS test due to difficulty and pain.    OBJECTIVE IMPAIRMENTS: Abnormal gait, cardiopulmonary status limiting activity, decreased activity tolerance, decreased balance, decreased endurance, decreased mobility, difficulty walking, decreased ROM,  decreased strength, increased edema, increased muscle spasms, impaired flexibility, postural dysfunction, and pain.   ACTIVITY LIMITATIONS: carrying, lifting, sitting, standing, squatting, sleeping, stairs, transfers, bed mobility, bathing, and locomotion level  PARTICIPATION LIMITATIONS: cleaning, laundry, driving, shopping, community activity, and yard work  PERSONAL FACTORS: Age, Fitness, Past/current experiences, 1 comorbidity:  , and 3+ comorbidities: as above are also affecting patient's functional outcome.  REHAB POTENTIAL: Good  CLINICAL DECISION MAKING: Evolving/moderate complexity  EVALUATION COMPLEXITY: Moderate   GOALS: Goals reviewed with patient? Yes  SHORT TERM GOALS: Target date: 02/25/24 Independent with initial HEP Baseline: Goal status:met 02/11/24   LONG TERM GOALS: Target date: 04/26/24  Independent with advanced HEP Baseline:  Goal status: progressing 03/10/24  2.  Go up and down stairs step over step Baseline:  Goal status: progressing 03/22/24  3.  Get up from sitting without hands Baseline:  Goal status: progressing 03/10/24  4.  Decrease TUG time to 15 seconds Baseline: 32 seconds Goal status: met 04/01/24  5.  Walk without device or SPC for all distances Baseline:  Goal status: met 02/25/24  6.  Walk 400 feet for Baseline:  Goal status:ongoing 03/10/24   PLAN:  PT FREQUENCY: 2x/week  PT DURATION: 12 weeks  PLANNED INTERVENTIONS: 97164- PT Re-evaluation, 97110-Therapeutic exercises, 97530- Therapeutic activity, 97112- Neuromuscular re-education, 97535- Self Care, [id number]- Manual therapy, [phone]- Gait training, Patient/Family education, Balance training, Stair training, and Cryotherapy  PLAN FOR NEXT SESSION: advance walking, endurance, strength and function, assess TUG   Bertil Brickey W, PT 04/12/2024, 1:08 PM

## 2024-04-13 DIAGNOSIS — M0600 Rheumatoid arthritis without rheumatoid factor, unspecified site: Secondary | ICD-10-CM | POA: Diagnosis not present

## 2024-04-13 DIAGNOSIS — Z6828 Body mass index (BMI) 28.0-28.9, adult: Secondary | ICD-10-CM | POA: Diagnosis not present

## 2024-04-13 DIAGNOSIS — E663 Overweight: Secondary | ICD-10-CM | POA: Diagnosis not present

## 2024-04-13 DIAGNOSIS — H353132 Nonexudative age-related macular degeneration, bilateral, intermediate dry stage: Secondary | ICD-10-CM | POA: Diagnosis not present

## 2024-04-13 DIAGNOSIS — Z79899 Other long term (current) drug therapy: Secondary | ICD-10-CM | POA: Diagnosis not present

## 2024-04-13 DIAGNOSIS — M1991 Primary osteoarthritis, unspecified site: Secondary | ICD-10-CM | POA: Diagnosis not present

## 2024-04-13 LAB — BASIC METABOLIC PANEL WITH GFR
BUN: 17 (ref 4–21)
CO2: 26 — AB (ref 13–22)
Chloride: 103 (ref 99–108)
Creatinine: 0.7 (ref 0.6–1.3)
Glucose: 101
Potassium: 4.4 meq/L (ref 3.5–5.1)
Sodium: 142 (ref 137–147)

## 2024-04-13 LAB — HEPATIC FUNCTION PANEL
AST: 18 (ref 14–40)
Alkaline Phosphatase: 70 (ref 25–125)
Bilirubin, Total: 0.7

## 2024-04-14 ENCOUNTER — Ambulatory Visit: Admitting: Physical Therapy

## 2024-04-14 ENCOUNTER — Encounter: Payer: Self-pay | Admitting: Physical Therapy

## 2024-04-14 DIAGNOSIS — M6281 Muscle weakness (generalized): Secondary | ICD-10-CM

## 2024-04-14 DIAGNOSIS — R262 Difficulty in walking, not elsewhere classified: Secondary | ICD-10-CM

## 2024-04-14 DIAGNOSIS — M25552 Pain in left hip: Secondary | ICD-10-CM

## 2024-04-14 DIAGNOSIS — R2681 Unsteadiness on feet: Secondary | ICD-10-CM

## 2024-04-14 LAB — CBC AND DIFFERENTIAL
HCT: 39 — AB (ref 41–53)
Hemoglobin: 12.7 — AB (ref 13.5–17.5)
Platelets: 122 K/uL — AB (ref 150–400)
WBC: 5.3

## 2024-04-14 LAB — COMPREHENSIVE METABOLIC PANEL WITH GFR
Calcium: 8.9 (ref 8.7–10.7)
eGFR: 88

## 2024-04-14 NOTE — Therapy (Signed)
 OUTPATIENT PHYSICAL THERAPY LOWER EXTREMITY TREATMENT     Patient Name: Connor [name] MRN: [id number] DOB:12-10-1933, 89 y.o., male Today's Date: 04/14/2024  END OF SESSION:  PT End of Session - 04/14/24 1054     Visit Number 21    Number of Visits 24    Date for Recertification  05/05/24    Authorization Type [employer] 21 of 24    PT Start Time 1054    PT Stop Time 1143    PT Time Calculation (min) 49 min    Activity Tolerance Patient tolerated treatment well    Behavior During Therapy WFL for tasks assessed/performed          Past Medical History:  Diagnosis Date   Allergy 1985   Anemia    Benign localized prostatic hyperplasia with lower urinary tract symptoms (LUTS)    urologist-- dr matilda   Cancer [facility]) 07/2018   prostate   Cataract 12/2012   CHF (congestive heart failure) (HCC) ?   Chronic anticoagulation    Xarelto  for afib   Chronic constipation    Chronic cystitis    Chronic kidney disease    Clotting disorder    GERD (gastroesophageal reflux disease)    Gross hematuria    Heart murmur ?   History of DVT of lower extremity 06/08/2010   right femoral dvt dx post op total hip 05-08-2010   History of gastric ulcer    remote   History of kidney stones    one stone. remote hx   Hypercholesteremia    Hyperlipidemia ?   Hypertension    Dx by Dr. Salena Skill around age  41    Macular degeneration    dry in both eyes,  no injections  sees Dr. Alvia   NICM (nonischemic cardiomyopathy) [facility])    per echo 06-11-2017,  ef 45-50% with diffuse hypokinesis,  G1DD   OSA on CPAP 2017   compliant with CPAP.  managed by Dr Jude.   Osteoarthritis    Other urethral stricture, male, meatal    chronic;  pt self dilates   Paroxysmal atrial fibrillation [facility]) cardiologist-- dr kelsie   first dx 2009/   a. documented by Dr Comer on office EKG 9/12. b. maintained on Tikosyn  and Xarelto . c. s/p DCCV 's.  d.  x3  EP w/ ablation atrial fib last one 12-05-2016    Pre-diabetes    Premature ventricular contraction    Presence of Watchman left atrial appendage closure device 05/30/2022   Watchman FLX 24mm by Dr. Cindie   PVC's (premature ventricular contractions)    RA (rheumatoid arthritis) [facility])    rheumatologist--- Dr LORELEI Jacob,  treated with Humira   RBBB (right bundle branch block)    Hx of a fib, ablation Aug 2018   PVCs,   Pt had Watchman placed 05-30-2022   Rheumatoid arthritis (HCC)    Sleep apnea    Ulcer    Vitamin D  deficiency    Wears hearing aid in both ears    Past Surgical History:  Procedure Laterality Date   ABLATION OF DYSRHYTHMIC FOCUS  08/29/2015   ATRIAL FIBRILLATION ABLATION  12/05/2016   ATRIAL FIBRILLATION ABLATION N/A 12/05/2016   Procedure: Atrial Fibrillation Ablation;  Surgeon: Kelsie Lynwood, MD;  Location: [facility] INVASIVE CV LAB;  Service: Cardiovascular;  Laterality: N/A;   ATRIAL FIBRILLATION ABLATION N/A 02/29/2020   Procedure: ATRIAL FIBRILLATION ABLATION;  Surgeon: Kelsie Lynwood, MD;  Location: [facility] INVASIVE CV LAB;  Service: Cardiovascular;  Laterality: N/A;   ATRIAL FLUTTER ABLATION  09/2010   by MILUS EDWARDS Right 1990s   CARDIOVERSION N/A 10/28/2012   Procedure: CARDIOVERSION;  Surgeon: Lonni JONETTA Cash, MD;  Location: [facility] OR;  Service: Cardiovascular;  Laterality: N/A;   CATARACT EXTRACTION W/ INTRAOCULAR LENS  IMPLANT, BILATERAL  2019   CYSTOSCOPY WITH BIOPSY N/A 09/03/2018   Procedure: CYSTOSCOPY WITH FULGURATION CELESTA;  Surgeon: Matilda Senior, MD;  Location: [facility] ORS;  Service: Urology;  Laterality: N/A;  30 MINS   CYSTOSCOPY WITH FULGERATION N/A 01/23/2022   Procedure: CYSTOSCOPY WITH FULGERATION OF PROSTATIC URETHRA;  Surgeon: Matilda Senior, MD;  Location: [facility] ORS;  Service: Urology;  Laterality: N/A;  30 MINS   DENTAL SURGERY     ELECTROPHYSIOLOGIC STUDY N/A 08/29/2015   Procedure: Atrial Fibrillation Ablation;  Surgeon: Lynwood Rakers, MD;  Location: [facility] INVASIVE CV LAB;  Service:  Cardiovascular;  Laterality: N/A;   EYE SURGERY     Cataracts   JOINT REPLACEMENT  1995, 1997   KNEE SURGERY Bilateral x3   1960s & 1970s   open   LEFT ATRIAL APPENDAGE OCCLUSION N/A 05/30/2022   Procedure: LEFT ATRIAL APPENDAGE OCCLUSION;  Surgeon: Cindie Ole DASEN, MD;  Location: [facility] INVASIVE CV LAB;  Service: Cardiovascular;  Laterality: N/A;   LEFT HEART CATHETERIZATION WITH CORONARY ANGIOGRAM N/A 06/07/2011   Procedure: LEFT HEART CATHETERIZATION WITH CORONARY ANGIOGRAM;  Surgeon: Peter M Jordan, MD;  Location: [facility] CATH LAB;  Service: Cardiovascular;  Laterality: N/A;    Normal coronary arteries,  low normal LVF   TEE WITHOUT CARDIOVERSION N/A 05/30/2022   Procedure: TRANSESOPHAGEAL ECHOCARDIOGRAM (TEE);  Surgeon: Cindie Ole DASEN, MD;  Location: [facility] INVASIVE CV LAB;  Service: Cardiovascular;  Laterality: N/A;   TONSILLECTOMY AND ADENOIDECTOMY  age 89   TOTAL HIP ARTHROPLASTY Right 06/04/10    dr jane  @[facility]    TOTAL HIP ARTHROPLASTY Left 01/07/2024   Procedure: ARTHROPLASTY, HIP, TOTAL,POSTERIOR APPROACH;  Surgeon: Edna Toribio LABOR, MD;  Location: [facility] ORS;  Service: Orthopedics;  Laterality: Left;   TOTAL KNEE ARTHROPLASTY Bilateral right 1995;  left 1997   TRANSURETHRAL RESECTION OF PROSTATE  06-29-2002   dr humphries  @[facility]    TRANSURETHRAL RESECTION OF PROSTATE N/A 07/06/2018   Procedure: TRANSURETHRAL RESECTION OF THE PROSTATE (TURP);  Surgeon: Matilda Senior, MD;  Location: [facility];  Service: Urology;  Laterality: N/A;  45 MINS   Patient Active Problem List   Diagnosis Date Noted   Primary osteoarthritis of left hip 01/07/2024   Pain due to onychomycosis of toenails of both feet 07/03/2023   Atrial fibrillation (HCC) 05/30/2022   Presence of Watchman left atrial appendage closure device 05/30/2022   Hematuria 01/04/2022   Gross hematuria 01/04/2022   Secondary hypercoagulable state 03/28/2020   Malignant neoplasm of prostate (HCC) 09/11/2018   Enlarged  prostate with urinary obstruction 07/06/2018   Gastrocnemius strain, right, initial encounter 12/10/2017   Baker's cyst of knee, right 12/10/2017   Obesity 03/24/2017   Paroxysmal atrial fibrillation (HCC) 12/05/2016   Near syncope 04/20/2016   Leg hematoma, left, initial encounter 04/20/2016   Bleeding    Ecchymosis    Left hamstring muscle strain    Muscle tear    RBBB 08/30/2015   Chronic systolic dysfunction of left ventricle 07/11/2014   OSA (obstructive sleep apnea) 04/07/2013   Multinodular goiter 01/20/2013   Cervical spondylosis without myelopathy 01/18/2013   Left ventricular dysfunction 12/17/2011   Low back pain 09/11/2011   Fatigue 05/22/2011  PVC (premature ventricular contraction) 01/18/2011   Persistent atrial fibrillation (HCC) 01/12/2011   Hyperlipidemia 08/02/2010   Hypertension 08/02/2010   Osteoarthritis 08/02/2010   BPH (benign prostatic hyperplasia) 08/02/2010   GERD (gastroesophageal reflux disease) 08/02/2010   h/o Atrial flutter (HCC) s/p ablation 2012     PCP: Copland, MD  REFERRING PROVIDER: Edna, MD  REFERRING DIAG: S/P Left THA posterior  THERAPY DIAG:  Pain in left hip  Muscle weakness (generalized)  Unsteadiness on feet  Difficulty in walking, not elsewhere classified  Rationale for Evaluation and Treatment: Rehabilitation  ONSET DATE: 01/07/24  SUBJECTIVE:   SUBJECTIVE STATEMENT: I am doing good today  Patient underwent left posterior THA on 01/07/24, he has had Home PT until last week.  He reports that he has very little pain, just fatigue.  He reports that he has been doing the HEP given from the hospital.    PERTINENT HISTORY: A fib, prostate CA, LBP. HLD, HTN, OA, RA, bilateral TKA PAIN:  Are you having pain? Yes: NPRS scale: 0/10 Pain location: left hip/back/thigh Pain description: spasm Aggravating factors: getting up from the chair pain up to 5/10 Relieving factors: rest, Tylenol , can have no pain  PRECAUTIONS:  Posterior hip  RED FLAGS: None   WEIGHT BEARING RESTRICTIONS: No  FALLS:  Has patient fallen in last 6 months? No  LIVING ENVIRONMENT: Lives with: lives with their family and lives with their spouse Lives in: House/apartment Stairs: Yes: Internal: 14 steps; can reach both Has following equipment at home: Vannie - 2 wheeled  OCCUPATION: retired  PLOF: Independent and shopping, no device, some yard work  PATIENT GOALS: I want to walk without any device, I want to be able to do yard work  NEXT MD VISIT: October 16  OBJECTIVE:  Note: Objective measures were completed at Evaluation unless otherwise noted.  DIAGNOSTIC FINDINGS: N/A   COGNITION: Overall cognitive status: Within functional limits for tasks assessed     SENSATION: WFL  EDEMA:  Has visible edema in the left thigh and knee  MUSCLE LENGTH: Tight calves and HS  POSTURE: rounded shoulders, forward head, and decreased lumbar lordosis  PALPATION: Mild tenderness in the left thigh and hip, buttock  LOWER EXTREMITY ROM:  Active ROM Right eval Left eval  Hip flexion  45  Hip extension    Hip abduction  10  Hip adduction    Hip internal rotation    Hip external rotation    Knee flexion    Knee extension    Ankle dorsiflexion    Ankle plantarflexion    Ankle inversion    Ankle eversion     (Blank rows = not tested)  LOWER EXTREMITY MMT:  MMT Right eval Left eval  Hip flexion  3+  Hip extension    Hip abduction  3+  Hip adduction    Hip internal rotation    Hip external rotation    Knee flexion  4-  Knee extension  4-  Ankle dorsiflexion    Ankle plantarflexion    Ankle inversion    Ankle eversion     (Blank rows = not tested)  FUNCTIONAL TESTS:  5 times sit to stand: unable to do without hands, had to use the arm rests and could only complete 3 due to pain and fatigue took 32 seconds to do 3 of the 5 Timed up and go (TUG): 32 seconds with FWW.  02/11/24 20 seconds with SPC 3 minute  walk test: 200 feet with the FWW  GAIT: Distance walked: 200 feet Assistive device utilized: Walker - 2 wheeled Level of assistance: SBA Comments: slow, left toe slightly turns in                                                                                                                                TREATMENT DATE:  04/14/24 Nustep level 5 x 6 minutes TUG10 seconds  5XSTS 23 seconds Sit to stand with 10# weight Ball kicks Leg curls 35# 5# leg extension On airex 10# straight arm pulls 5# Marches fatigue in the left thigh with this increased weight 5# hip abduction Ball b/n knees squeeze Fast walk 3 laps  More square 4 blocks side to side only with CGA  04/12/24 Leg curls 35# 3x10 Leg extension 5# 2.5# marches 2.5# hip abduction Ball squeeze Nustep level 6 x 6 minutes On airex 10# straight arm pulls More square 2blocks high On airex ball toss and bounce Fast walk 2 laps  04/07/24 Nustep level 5 x 7 minutes Leg curls 35# Leg ext 5# 5# marches, hip abduction and extension in Pbars Sit to stand with weight Side stepping Fast walking 4 laps Back to wall back to wall ball OHP working on posture  03/24/24 Leg curls 35# 3x10 Leg extension 5# 3x10 Gait outside long walk all the way around the back building, with a seated rest break at a picnic table, walking up the hill had to use the hand rail on the side walk, standing rest break toward the top, then another standing rest break before we made it back to the building Discussion on building strength and endurance as well as being safe, using the SPC on long walks or when outside , not using the cane when feeling good and when inside  03/22/24 Leg curls 35# LEg ext 5# Nustep level 5 x 6 minutes Gait outside with SPC down the hill around [place] at the bottom and then back with 3-4 rest breaks [place] 2 and 3 blocks step overs On airex ball catch some difficulty with LOB Side step on and off airex Obstacle  course step over 1/2 and full bolster and then step up and down a 6 step with SPC and CGA  03/17/24 Leg curls 35# Leg extension 5# On airex 10# straight arm pulls More square 2 blocks step overs all directions Sit to stand with weighted ball 2x5 Nustep level 5 x 7 minutes 6  step ups holding on needs cues to push with the left leg and extend the knee 40# resisted gait all directions with CGA  03/15/24 Nustep level 5 x 8 minutes Leg curls 35# Leg ext 5# On airex ball toss On airex cone toe touch with SPC Side step on and off aires 40# resisted walking all directions 8 toe clears More square 2 blocks step overs front to back and side to side  03/10/24 Leg curls 35# 3x10 Leg extension 5# 3x10 Gait around the  back parking [place] and then around to the front of the building  3 rest breaks standing Nustep level 5 x 6 minutes 2.5# marches 2.5# hip abduction Side stepping on airex balance beam in pbars no hands CGA On airex ball toss and bounce  03/08/24 Nustep level 5 x 6 minutes Leg curls 35# 2x10 Leg extension 5# 2x10 On airex 10# straight arm pulls On airex ball toss  On airex cone toe touches Side step on and off airex Gait outside around the parking [place] 2 rest breaks, very fatigued after this Ball b/n knees squeeze Blue tband clamshells Gait out back door, in the grass and pine needels uneven terrain and a curb some CGA but mostly SBA  03/03/24 Nustep level 5 x 6 minutes 40# resisted walking all directions 35# HS curls 3x10 10# Leg extension 3x10 2.5# marches and hip abduction Ball b/n knees squeeze Step overs side to side, front to back Side stepping  Walking ball toss Practiced stairs step over step, needed cues and hand rail  03/01/24 Nustep level 5 x 6 minutes Resisted gait 30# fwd and backward 3# marches and abduction 35# HS curls 5# Leg extension Walking ball toss Sit to stand with weighted ball reach Ball b/n knees squeeze Side step on and  off airex CGA needed  02/25/24 Nustep level 5 x 6 minutes Leg curls 35# 3x10 Leg extension 5# 3x10 Gait outside around the entire parking [place] with 2 rests 2.5# marches and abduction Feet on ball K2C, rotation, bridges and isometric abs Blue tband clamshells Ball b/n knees squeeze  02/23/24 Nustep level 5 x 6 minutes LEg curls 35# 3x10 Leg extension 5# 3x10 2.5# marches 2.5# extension Gait outside 1/2 way around the parking [place] short standing break and then through the grass to the front door On airex ball toss Side stepping over object Forward and back stepping over object Walking in clinic without device, forward, back ward and side stepping Education and discussion on multi tasking and how when he does this he tends to be off balance, gave instructions to stop walking prior to doing another task  02/18/24 Leg curls 25# 3x10 Leg ext 5# 3x10 Gait outside 1/2 parking [place] with 1 rest break 2.5# MArches 2.5# abduction Side stepping backward walking TUG without device 16 seconds  PATIENT EDUCATION:  Education details: POC/Reviewed HEP Person educated: Patient Education method: Explanation Education comprehension: verbalized understanding  HOME EXERCISE PROGRAM: From hospital   ASSESSMENT:  CLINICAL IMPRESSION: Continued to work on strength, balance and gait, he does fatigue quickly, really struggles with the step overs needing CGA.  TUG and 5XSTS has improved greatly, he still struggles with the sit to stand, will tend to use the back of the knees to help get up, he reports difficulty getting up when he is out at a restaurant .  Patient is a 89 y.o. male who was seen today for physical therapy evaluation and treatment for s/p left THA posterior approach.   He was having significant pain prior to the surgery so he had been limited with his ability.  He reports that for the most part he is without pain and only takes Tylenol .  He really struggles to get up from  sitting, could not complete the 5XSTS test due to difficulty and pain.    OBJECTIVE IMPAIRMENTS: Abnormal gait, cardiopulmonary status limiting activity, decreased activity tolerance, decreased balance, decreased endurance, decreased mobility, difficulty walking, decreased ROM, decreased strength, increased edema, increased muscle spasms, impaired flexibility, postural dysfunction,  and pain.   ACTIVITY LIMITATIONS: carrying, lifting, sitting, standing, squatting, sleeping, stairs, transfers, bed mobility, bathing, and locomotion level  PARTICIPATION LIMITATIONS: cleaning, laundry, driving, shopping, community activity, and yard work  PERSONAL FACTORS: Age, Fitness, Past/current experiences, 1 comorbidity:  , and 3+ comorbidities: as above are also affecting patient's functional outcome.   REHAB POTENTIAL: Good  CLINICAL DECISION MAKING: Evolving/moderate complexity  EVALUATION COMPLEXITY: Moderate   GOALS: Goals reviewed with patient? Yes  SHORT TERM GOALS: Target date: 02/25/24 Independent with initial HEP Baseline: Goal status:met 02/11/24   LONG TERM GOALS: Target date: 04/26/24  Independent with advanced HEP Baseline:  Goal status: progressing 03/10/24  2.  Go up and down stairs step over step Baseline:  Goal status: progressing 03/22/24  3.  Get up from sitting without hands Baseline:  Goal status: progressing 03/10/24 still struggling 04/14/24  4.  Decrease TUG time to 15 seconds Baseline: 32 seconds Goal status: met 04/01/24  04/14/24 able to do in 10 seconds  5.  Walk without device or SPC for all distances Baseline:  Goal status: met 02/25/24  6.  Walk 400 feet for Baseline:  Goal status: progressing 04/14/24   PLAN:  PT FREQUENCY: 2x/week  PT DURATION: 12 weeks  PLANNED INTERVENTIONS: 97164- PT Re-evaluation, 97110-Therapeutic exercises, 97530- Therapeutic activity, 97112- Neuromuscular re-education, 97535- Self Care, [id number]- Manual therapy,  [phone]- Gait training, Patient/Family education, Balance training, Stair training, and Cryotherapy  PLAN FOR NEXT SESSION: advance walking, endurance, strength and function,  Bayard More W, PT 04/14/2024, 10:55 AM

## 2024-04-19 ENCOUNTER — Ambulatory Visit: Admitting: Physical Therapy

## 2024-04-19 ENCOUNTER — Ambulatory Visit: Admitting: Family Medicine

## 2024-04-19 ENCOUNTER — Encounter: Payer: Self-pay | Admitting: Physical Therapy

## 2024-04-19 VITALS — BP 124/76 | HR 69 | Ht 72.0 in | Wt 212.8 lb

## 2024-04-19 DIAGNOSIS — R262 Difficulty in walking, not elsewhere classified: Secondary | ICD-10-CM

## 2024-04-19 DIAGNOSIS — R252 Cramp and spasm: Secondary | ICD-10-CM

## 2024-04-19 DIAGNOSIS — M79674 Pain in right toe(s): Secondary | ICD-10-CM | POA: Diagnosis not present

## 2024-04-19 DIAGNOSIS — M25552 Pain in left hip: Secondary | ICD-10-CM | POA: Diagnosis not present

## 2024-04-19 DIAGNOSIS — M6281 Muscle weakness (generalized): Secondary | ICD-10-CM

## 2024-04-19 DIAGNOSIS — R2681 Unsteadiness on feet: Secondary | ICD-10-CM

## 2024-04-19 DIAGNOSIS — R2689 Other abnormalities of gait and mobility: Secondary | ICD-10-CM

## 2024-04-19 NOTE — Progress Notes (Signed)
 [facility] [address] [place], [place] [address] [phone]  Date:  04/19/2024   Name:  Connor [name]   DOB:  20-Dec-1933   MRN:  [id number]  PCP:  Watt Harlene BROCKS, MD    Chief Complaint: Foot Pain (R foot pain onset Saturday afternoon /When I walk and put pressure on it, it hurts)   History of Present Illness:  Connor [name] is a 89 y.o. very pleasant male patient who presents with the following:  Pt seen today with foot pain and difficulty walking Last seen by me last month  History of atrial fibrillation status post multiple ablation and watchman placed 2024, left ventricle dysfunction, prostate cancer, seronegative rheumatoid arthritis and osteoarthritis, sleep apnea.  Has some difficulty using his CPAP and typically uses it for just an hour or 2 nightly He does IM humira weekly for his RA- he continues to do this  He notes right foot pain for the last 2 days   Discussed the use of AI scribe software for clinical note transcription with the patient, who gave verbal consent to proceed.  History of Present Illness JSON Connor [name] is an 89 year old male who presents with right foot pain, suspected to be gout.  On Saturday, he experienced a sudden onset of severe pain in his right foot, specifically around the large toe, accompanied by swelling. He suspected gout after researching his symptoms online. He has no prior history of gout, and a blood test for gout conducted a year ago was negative. He began taking ibuprofen, which has helped alleviate the pain. As of today, he can walk fine and is only conscious of the area without experiencing pain. No recent injury to the foot.  He is currently undergoing therapy for a recent hip replacement, which is progressing well. He attends therapy sessions to improve balance and strength and anticipates continuing therapy through December.  He also  reports issues with hearing, noting that his hearing aids are not functioning well, particularly in the right ear.    Patient Active Problem List   Diagnosis Date Noted   Primary osteoarthritis of left hip 01/07/2024   Pain due to onychomycosis of toenails of both feet 07/03/2023   Atrial fibrillation (HCC) 05/30/2022   Presence of Watchman left atrial appendage closure device 05/30/2022   Hematuria 01/04/2022   Gross hematuria 01/04/2022   Secondary hypercoagulable state 03/28/2020   Malignant neoplasm of prostate (HCC) 09/11/2018   Enlarged prostate with urinary obstruction 07/06/2018   Gastrocnemius strain, right, initial encounter 12/10/2017   Baker's cyst of knee, right 12/10/2017   Obesity 03/24/2017   Paroxysmal atrial fibrillation (HCC) 12/05/2016   Near syncope 04/20/2016   Leg hematoma, left, initial encounter 04/20/2016   Bleeding    Ecchymosis    Left hamstring muscle strain    Muscle tear    RBBB 08/30/2015   Chronic systolic dysfunction of left ventricle 07/11/2014   OSA (obstructive sleep apnea) 04/07/2013   Multinodular goiter 01/20/2013   Cervical spondylosis without myelopathy 01/18/2013   Left ventricular dysfunction 12/17/2011   Low back pain 09/11/2011   Fatigue 05/22/2011   PVC (premature ventricular contraction) 01/18/2011   Persistent atrial fibrillation (HCC) 01/12/2011   Hyperlipidemia 08/02/2010   Hypertension 08/02/2010   Osteoarthritis 08/02/2010   BPH (benign prostatic hyperplasia) 08/02/2010   GERD (gastroesophageal reflux disease) 08/02/2010   h/o Atrial flutter (HCC) s/p ablation  2012     Past Medical History:  Diagnosis Date   Allergy 1985   Anemia    Benign localized prostatic hyperplasia with lower urinary tract symptoms (LUTS)    urologist-- dr dahlstedt   Cancer [facility]) 07/2018   prostate   Cataract 12/2012   CHF (congestive heart failure) (HCC) ?   Chronic anticoagulation    Xarelto  for afib   Chronic constipation    Chronic  cystitis    Chronic kidney disease    Clotting disorder    GERD (gastroesophageal reflux disease)    Gross hematuria    Heart murmur ?   History of DVT of lower extremity 06/08/2010   right femoral dvt dx post op total hip 05-08-2010   History of gastric ulcer    remote   History of kidney stones    one stone. remote hx   Hypercholesteremia    Hyperlipidemia ?   Hypertension    Dx by Dr. Salena Skill around age  52    Macular degeneration    dry in both eyes,  no injections  sees Dr. Alvia   NICM (nonischemic cardiomyopathy) [facility])    per echo 06-11-2017,  ef 45-50% with diffuse hypokinesis,  G1DD   OSA on CPAP 2017   compliant with CPAP.  managed by Dr Jude.   Osteoarthritis    Other urethral stricture, male, meatal    chronic;  pt self dilates   Paroxysmal atrial fibrillation [facility]) cardiologist-- dr kelsie   first dx 2009/   a. documented by Dr Comer on office EKG 9/12. b. maintained on Tikosyn  and Xarelto . c. s/p DCCV 's.  d.  x3  EP w/ ablation atrial fib last one 12-05-2016   Pre-diabetes    Premature ventricular contraction    Presence of Watchman left atrial appendage closure device 05/30/2022   Watchman FLX 24mm by Dr. Cindie   PVC's (premature ventricular contractions)    RA (rheumatoid arthritis) [facility])    rheumatologist--- Dr LORELEI Jacob,  treated with Humira   RBBB (right bundle branch block)    Hx of a fib, ablation Aug 2018   PVCs,   Pt had Watchman placed 05-30-2022   Rheumatoid arthritis (HCC)    Sleep apnea    Ulcer    Vitamin D  deficiency    Wears hearing aid in both ears     Past Surgical History:  Procedure Laterality Date   ABLATION OF DYSRHYTHMIC FOCUS  08/29/2015   ATRIAL FIBRILLATION ABLATION  12/05/2016   ATRIAL FIBRILLATION ABLATION N/A 12/05/2016   Procedure: Atrial Fibrillation Ablation;  Surgeon: Kelsie Agent, MD;  Location: [facility] INVASIVE CV LAB;  Service: Cardiovascular;  Laterality: N/A;   ATRIAL FIBRILLATION ABLATION N/A 02/29/2020    Procedure: ATRIAL FIBRILLATION ABLATION;  Surgeon: Kelsie Agent, MD;  Location: [facility] INVASIVE CV LAB;  Service: Cardiovascular;  Laterality: N/A;   ATRIAL FLUTTER ABLATION  09/2010   by MILUS EDWARDS Right 1990s   CARDIOVERSION N/A 10/28/2012   Procedure: CARDIOVERSION;  Surgeon: Lonni JONETTA Cash, MD;  Location: [facility] OR;  Service: Cardiovascular;  Laterality: N/A;   CATARACT EXTRACTION W/ INTRAOCULAR LENS  IMPLANT, BILATERAL  2019   CYSTOSCOPY WITH BIOPSY N/A 09/03/2018   Procedure: CYSTOSCOPY WITH FULGURATION CELESTA;  Surgeon: Matilda Senior, MD;  Location: [facility] ORS;  Service: Urology;  Laterality: N/A;  30 MINS   CYSTOSCOPY WITH FULGERATION N/A 01/23/2022   Procedure: CYSTOSCOPY WITH FULGERATION OF PROSTATIC URETHRA;  Surgeon: Matilda Senior, MD;  Location: [facility] ORS;  Service: Urology;  Laterality: N/A;  30 MINS   DENTAL SURGERY     ELECTROPHYSIOLOGIC STUDY N/A 08/29/2015   Procedure: Atrial Fibrillation Ablation;  Surgeon: Lynwood Rakers, MD;  Location: [facility] INVASIVE CV LAB;  Service: Cardiovascular;  Laterality: N/A;   EYE SURGERY     Cataracts   JOINT REPLACEMENT  1995, 1997   KNEE SURGERY Bilateral x3   1960s & 1970s   open   LEFT ATRIAL APPENDAGE OCCLUSION N/A 05/30/2022   Procedure: LEFT ATRIAL APPENDAGE OCCLUSION;  Surgeon: Cindie Ole DASEN, MD;  Location: [facility] INVASIVE CV LAB;  Service: Cardiovascular;  Laterality: N/A;   LEFT HEART CATHETERIZATION WITH CORONARY ANGIOGRAM N/A 06/07/2011   Procedure: LEFT HEART CATHETERIZATION WITH CORONARY ANGIOGRAM;  Surgeon: Peter M Jordan, MD;  Location[facility] CATH LAB;  Service: Cardiovascular;  Laterality: N/A;    Normal coronary arteries,  low normal LVF   TEE WITHOUT CARDIOVERSION N/A 05/30/2022   Procedure: TRANSESOPHAGEAL ECHOCARDIOGRAM (TEE);  Surgeon: Cindie Ole DASEN, MD;  Location[facility] INVASIVE CV LAB;  Service: Cardiovascular;  Laterality: N/A;   TONSILLECTOMY AND ADENOIDECTOMY  age 78   TOTAL HIP ARTHROPLASTY Right 06/04/10     dr jane  @[facility]    TOTAL HIP ARTHROPLASTY Left 01/07/2024   Procedure: ARTHROPLASTY, HIP, TOTAL,POSTERIOR APPROACH;  Surgeon: Edna Toribio LABOR, MD;  Location: [facility] ORS;  Service: Orthopedics;  Laterality: Left;   TOTAL KNEE ARTHROPLASTY Bilateral right 1995;  left 1997   TRANSURETHRAL RESECTION OF PROSTATE  06-29-2002   dr humphries  @[facility]    TRANSURETHRAL RESECTION OF PROSTATE N/A 07/06/2018   Procedure: TRANSURETHRAL RESECTION OF THE PROSTATE (TURP);  Surgeon: Matilda Senior, MD;  Location: [facility];  Service: Urology;  Laterality: N/A;  45 MINS    Social History[1]  Family History  Problem Relation Age of Onset   Heart attack Father    Heart disease Father    Obesity Father    Lung disease Mother    Arthritis Mother    Arrhythmia Sister    Atrial fibrillation Brother    Diabetes Neg Hx     Allergies[2]  Medication list has been reviewed and updated.  Medications Ordered Prior to Encounter[3]  Review of Systems:  As per HPI- otherwise negative.   Physical Examination: Vitals:   04/19/24 1432  BP: 124/76  Pulse: 69  SpO2: 97%   Vitals:   04/19/24 1432  Weight: 212 lb 12.8 oz (96.5 kg)  Height: 6' (1.829 m)   Body mass index is 28.86 kg/m. Ideal Body Weight: Weight in (lb) to have BMI = 25: 183.9  GEN: no acute distress.  Mild overweight, looks well.  HOH HEENT: Atraumatic, Normocephalic.  Ears and Nose: No external deformity. EXTR: No c/c/e PSYCH: Normally interactive. Conversant.  Right first MTP; pt indicates this as the site of his pain but it is much better- basically resolved- now No tenderness with movement no heat or redness   Assessment and Plan: Great toe pain, right - Plan: Uric acid  Assessment & Plan Acute gout flare, right great toe Acute gout flare suspected due to pain location and response to ibuprofen. Previous negative blood test, but current symptoms suggestive. - Ordered blood test for uric acid levels. - Plan to  prescribe colchicine for future flares if uric acid is elevated.  Signed Harlene Schroeder, MD     [1]  Social History Tobacco Use   Smoking status: Former    Current packs/day: 0.00    Average packs/day: 1 pack/day for  30.0 years (30.0 ttl pk-yrs)    Types: Cigarettes    Start date: 05/06/1948    Quit date: 05/06/1978    Years since quitting: 45.9   Smokeless tobacco: Never   Tobacco comments:    Former smoker 03/14/2021  Vaping Use   Vaping status: Never Used  Substance Use Topics   Alcohol  use: Yes    Alcohol /week: 1.0 standard drink of alcohol     Types: 1 Glasses of wine per week    Comment: occasionally wine and beer   Drug use: Never  [2]  Allergies Allergen Reactions   Penicillins Itching, Swelling and Other (See Comments)    Did it involve swelling of the face/tongue/throat, SOB, or low BP? No Did it involve sudden or severe rash/hives, skin peeling, or any reaction on the inside of your mouth or nose? No Did you need to seek medical attention at a hospital or doctor's office? No When did it last happen?      30 + years If all above answers are NO, may proceed with cephalosporin use.    [3]  Current Outpatient Medications on File Prior to Visit  Medication Sig Dispense Refill   MYRBETRIQ 25 MG TB24 tablet Take 25 mg by mouth daily.     atorvastatin  (LIPITOR) 10 MG tablet TAKE 1 TABLET BY MOUTH EVERY DAY 90 tablet 1   Carboxymethylcellulose Sodium (THERATEARS OP) Apply 1 drop to eye daily as needed (dry eyes).     cephALEXin  (KEFLEX ) 500 MG capsule Take 2,000 mg by mouth as directed. Dental appointments     Cholecalciferol  (VITAMIN D3) 5000 UNITS TABS Take 5,000 Units by mouth every morning.     CRANBERRY PO Take 9,600 mg by mouth 2 (two) times daily. 4800 mg each     HUMIRA PEN 40 MG/0.4ML PNKT Inject 40 mg as directed every 7 (seven) days. Every Tuesday  6   lisinopril  (ZESTRIL ) 40 MG tablet Take 1 tablet (40 mg total) by mouth daily. 90 tablet 3   Melatonin 10  MG CHEW Chew 3 Pieces of gum by mouth at bedtime. gummy     metoprolol  succinate (TOPROL -XL) 50 MG 24 hr tablet Take 1 tablet (50 mg total) by mouth 2 (two) times daily. Take with or immediately following a meal. 180 tablet 3   Multiple Vitamins-Minerals (PRESERVISION AREDS 2) CAPS Take 1 capsule by mouth 2 (two) times a day.     pantoprazole  (PROTONIX ) 40 MG tablet TAKE 1 TABLET BY MOUTH EVERY DAY 90 tablet 1   senna (SENOKOT) 8.6 MG TABS tablet Take 2 tablets by mouth at bedtime.     solifenacin (VESICARE) 10 MG tablet Take 10 mg by mouth at bedtime.  (Patient not taking: Reported on 04/19/2024)     tamsulosin  (FLOMAX ) 0.4 MG CAPS capsule Take 1 capsule (0.4 mg total) by mouth daily after supper. 30 capsule 5   Testosterone  20.25 MG/ACT (1.62%) GEL Apply 1 Pump topically once a week. Apply to shoulders     vitamin B-12 (CYANOCOBALAMIN ) 500 MCG tablet Take 500 mcg by mouth every evening.      No current facility-administered medications on file prior to visit.

## 2024-04-19 NOTE — Therapy (Signed)
 OUTPATIENT PHYSICAL THERAPY LOWER EXTREMITY TREATMENT     Patient Name: Connor [name] MRN: [id number] DOB:[birth year], 89 y.o., male Today's Date: 04/19/2024  END OF SESSION:  PT End of Session - 04/19/24 1053     Visit Number 22    Number of Visits 24    Date for Recertification  05/05/24    Authorization Type [employer] 22 of 24    PT Start Time 1054    PT Stop Time 1142    PT Time Calculation (min) 48 min    Activity Tolerance Patient tolerated treatment well    Behavior During Therapy WFL for tasks assessed/performed          Past Medical History:  Diagnosis Date   Allergy 1985   Anemia    Benign localized prostatic hyperplasia with lower urinary tract symptoms (LUTS)    urologist-- dr matilda   Cancer [facility]) 07/2018   prostate   Cataract 12/2012   CHF (congestive heart failure) (HCC) ?   Chronic anticoagulation    Xarelto  for afib   Chronic constipation    Chronic cystitis    Chronic kidney disease    Clotting disorder    GERD (gastroesophageal reflux disease)    Gross hematuria    Heart murmur ?   History of DVT of lower extremity 06/08/2010   right femoral dvt dx post op total hip 05-08-2010   History of gastric ulcer    remote   History of kidney stones    one stone. remote hx   Hypercholesteremia    Hyperlipidemia ?   Hypertension    Dx by Dr. Salena Skill around age  37    Macular degeneration    dry in both eyes,  no injections  sees Dr. Alvia   NICM (nonischemic cardiomyopathy) [facility])    per echo 06-11-2017,  ef 45-50% with diffuse hypokinesis,  G1DD   OSA on CPAP 2017   compliant with CPAP.  managed by Dr Jude.   Osteoarthritis    Other urethral stricture, male, meatal    chronic;  pt self dilates   Paroxysmal atrial fibrillation [facility]) cardiologist-- dr kelsie   first dx 2009/   a. documented by Dr Comer on office EKG 9/12. b. maintained on Tikosyn  and Xarelto . c. s/p DCCV 's.  d.  x3  EP w/ ablation atrial fib last one 12-05-2016    Pre-diabetes    Premature ventricular contraction    Presence of Watchman left atrial appendage closure device 05/30/2022   Watchman FLX 24mm by Dr. Cindie   PVC's (premature ventricular contractions)    RA (rheumatoid arthritis) [facility])    rheumatologist--- Dr LORELEI Jacob,  treated with Humira   RBBB (right bundle branch block)    Hx of a fib, ablation Aug 2018   PVCs,   Pt had Watchman placed 05-30-2022   Rheumatoid arthritis (HCC)    Sleep apnea    Ulcer    Vitamin D  deficiency    Wears hearing aid in both ears    Past Surgical History:  Procedure Laterality Date   ABLATION OF DYSRHYTHMIC FOCUS  08/29/2015   ATRIAL FIBRILLATION ABLATION  12/05/2016   ATRIAL FIBRILLATION ABLATION N/A 12/05/2016   Procedure: Atrial Fibrillation Ablation;  Surgeon: Kelsie Lynwood, MD;  Location: [facility] INVASIVE CV LAB;  Service: Cardiovascular;  Laterality: N/A;   ATRIAL FIBRILLATION ABLATION N/A 02/29/2020   Procedure: ATRIAL FIBRILLATION ABLATION;  Surgeon: Kelsie Lynwood, MD;  Location: [facility] INVASIVE CV LAB;  Service: Cardiovascular;  Laterality: N/A;   ATRIAL FLUTTER ABLATION  09/2010   by MILUS EDWARDS Right 1990s   CARDIOVERSION N/A 10/28/2012   Procedure: CARDIOVERSION;  Surgeon: Lonni JONETTA Cash, MD;  Location: [facility] OR;  Service: Cardiovascular;  Laterality: N/A;   CATARACT EXTRACTION W/ INTRAOCULAR LENS  IMPLANT, BILATERAL  2019   CYSTOSCOPY WITH BIOPSY N/A 09/03/2018   Procedure: CYSTOSCOPY WITH FULGURATION CELESTA;  Surgeon: Matilda Senior, MD;  Location: [facility] ORS;  Service: Urology;  Laterality: N/A;  30 MINS   CYSTOSCOPY WITH FULGERATION N/A 01/23/2022   Procedure: CYSTOSCOPY WITH FULGERATION OF PROSTATIC URETHRA;  Surgeon: Matilda Senior, MD;  Location: [facility] ORS;  Service: Urology;  Laterality: N/A;  30 MINS   DENTAL SURGERY     ELECTROPHYSIOLOGIC STUDY N/A 08/29/2015   Procedure: Atrial Fibrillation Ablation;  Surgeon: Lynwood Rakers, MD;  Location: [facility] INVASIVE CV LAB;  Service:  Cardiovascular;  Laterality: N/A;   EYE SURGERY     Cataracts   JOINT REPLACEMENT  1995, 1997   KNEE SURGERY Bilateral x3   1960s & 1970s   open   LEFT ATRIAL APPENDAGE OCCLUSION N/A 05/30/2022   Procedure: LEFT ATRIAL APPENDAGE OCCLUSION;  Surgeon: Cindie Ole DASEN, MD;  Location: [facility] INVASIVE CV LAB;  Service: Cardiovascular;  Laterality: N/A;   LEFT HEART CATHETERIZATION WITH CORONARY ANGIOGRAM N/A 06/07/2011   Procedure: LEFT HEART CATHETERIZATION WITH CORONARY ANGIOGRAM;  Surgeon: Peter M Jordan, MD;  Location: [facility] CATH LAB;  Service: Cardiovascular;  Laterality: N/A;    Normal coronary arteries,  low normal LVF   TEE WITHOUT CARDIOVERSION N/A 05/30/2022   Procedure: TRANSESOPHAGEAL ECHOCARDIOGRAM (TEE);  Surgeon: Cindie Ole DASEN, MD;  Location: [facility] INVASIVE CV LAB;  Service: Cardiovascular;  Laterality: N/A;   TONSILLECTOMY AND ADENOIDECTOMY  age 45   TOTAL HIP ARTHROPLASTY Right 06/04/10    dr jane  @[facility]    TOTAL HIP ARTHROPLASTY Left 01/07/2024   Procedure: ARTHROPLASTY, HIP, TOTAL,POSTERIOR APPROACH;  Surgeon: Edna Toribio LABOR, MD;  Location: [facility] ORS;  Service: Orthopedics;  Laterality: Left;   TOTAL KNEE ARTHROPLASTY Bilateral right 1995;  left 1997   TRANSURETHRAL RESECTION OF PROSTATE  06-29-2002   dr humphries  @[facility]    TRANSURETHRAL RESECTION OF PROSTATE N/A 07/06/2018   Procedure: TRANSURETHRAL RESECTION OF THE PROSTATE (TURP);  Surgeon: Matilda Senior, MD;  Location: [facility];  Service: Urology;  Laterality: N/A;  45 MINS   Patient Active Problem List   Diagnosis Date Noted   Primary osteoarthritis of left hip 01/07/2024   Pain due to onychomycosis of toenails of both feet 07/03/2023   Atrial fibrillation (HCC) 05/30/2022   Presence of Watchman left atrial appendage closure device 05/30/2022   Hematuria 01/04/2022   Gross hematuria 01/04/2022   Secondary hypercoagulable state 03/28/2020   Malignant neoplasm of prostate (HCC) 09/11/2018   Enlarged  prostate with urinary obstruction 07/06/2018   Gastrocnemius strain, right, initial encounter 12/10/2017   Baker's cyst of knee, right 12/10/2017   Obesity 03/24/2017   Paroxysmal atrial fibrillation (HCC) 12/05/2016   Near syncope 04/20/2016   Leg hematoma, left, initial encounter 04/20/2016   Bleeding    Ecchymosis    Left hamstring muscle strain    Muscle tear    RBBB 08/30/2015   Chronic systolic dysfunction of left ventricle 07/11/2014   OSA (obstructive sleep apnea) 04/07/2013   Multinodular goiter 01/20/2013   Cervical spondylosis without myelopathy 01/18/2013   Left ventricular dysfunction 12/17/2011   Low back pain 09/11/2011   Fatigue 05/22/2011  PVC (premature ventricular contraction) 01/18/2011   Persistent atrial fibrillation (HCC) 01/12/2011   Hyperlipidemia 08/02/2010   Hypertension 08/02/2010   Osteoarthritis 08/02/2010   BPH (benign prostatic hyperplasia) 08/02/2010   GERD (gastroesophageal reflux disease) 08/02/2010   h/o Atrial flutter (HCC) s/p ablation 2012     PCP: Copland, MD  REFERRING PROVIDER: Edna, MD  REFERRING DIAG: S/P Left THA posterior  THERAPY DIAG:  Pain in left hip  Muscle weakness (generalized)  Unsteadiness on feet  Difficulty in walking, not elsewhere classified  Other abnormalities of gait and mobility  Cramp and spasm  Rationale for Evaluation and Treatment: Rehabilitation  ONSET DATE: 01/07/24  SUBJECTIVE:   SUBJECTIVE STATEMENT: Patient reports that he had an issue with his right toe on Saturday, He thinks it is gout and is seeing the MD today  Patient underwent left posterior THA on 01/07/24, he has had Home PT until last week.  He reports that he has very little pain, just fatigue.  He reports that he has been doing the HEP given from the hospital.    PERTINENT HISTORY: A fib, prostate CA, LBP. HLD, HTN, OA, RA, bilateral TKA PAIN:  Are you having pain? Yes: NPRS scale: 0/10 Pain location: left  hip/back/thigh Pain description: spasm Aggravating factors: getting up from the chair pain up to 5/10 Relieving factors: rest, Tylenol , can have no pain  PRECAUTIONS: Posterior hip  RED FLAGS: None   WEIGHT BEARING RESTRICTIONS: No  FALLS:  Has patient fallen in last 6 months? No  LIVING ENVIRONMENT: Lives with: lives with their family and lives with their spouse Lives in: House/apartment Stairs: Yes: Internal: 14 steps; can reach both Has following equipment at home: Vannie - 2 wheeled  OCCUPATION: retired  PLOF: Independent and shopping, no device, some yard work  PATIENT GOALS: I want to walk without any device, I want to be able to do yard work  NEXT MD VISIT: October 16  OBJECTIVE:  Note: Objective measures were completed at Evaluation unless otherwise noted.  DIAGNOSTIC FINDINGS: N/A   COGNITION: Overall cognitive status: Within functional limits for tasks assessed     SENSATION: WFL  EDEMA:  Has visible edema in the left thigh and knee  MUSCLE LENGTH: Tight calves and HS  POSTURE: rounded shoulders, forward head, and decreased lumbar lordosis  PALPATION: Mild tenderness in the left thigh and hip, buttock  LOWER EXTREMITY ROM:  Active ROM Right eval Left eval  Hip flexion  45  Hip extension    Hip abduction  10  Hip adduction    Hip internal rotation    Hip external rotation    Knee flexion    Knee extension    Ankle dorsiflexion    Ankle plantarflexion    Ankle inversion    Ankle eversion     (Blank rows = not tested)  LOWER EXTREMITY MMT:  MMT Right eval Left eval  Hip flexion  3+  Hip extension    Hip abduction  3+  Hip adduction    Hip internal rotation    Hip external rotation    Knee flexion  4-  Knee extension  4-  Ankle dorsiflexion    Ankle plantarflexion    Ankle inversion    Ankle eversion     (Blank rows = not tested)  FUNCTIONAL TESTS:  5 times sit to stand: unable to do without hands, had to use the arm  rests and could only complete 3 due to pain and fatigue took 32 seconds  to do 3 of the 5 Timed up and go (TUG): 32 seconds with FWW.  02/11/24 20 seconds with SPC 3 minute walk test: 200 feet with the FWW  GAIT: Distance walked: 200 feet Assistive device utilized: Walker - 2 wheeled Level of assistance: SBA Comments: slow, left toe slightly turns in                                                                                                                                TREATMENT DATE:  04/19/24 Nustep level 5 x 6 minutes 35# leg curls 5# leg extension 3# marches 3# abduction Ball b/n knees Sit to stand with weighted ball Feet on ball K2C, rotation, small bridge, isometric abs Supine marches  04/14/24 Nustep level 5 x 6 minutes TUG10 seconds  5XSTS 23 seconds Sit to stand with 10# weight Ball kicks Leg curls 35# 5# leg extension On airex 10# straight arm pulls 5# Marches fatigue in the left thigh with this increased weight 5# hip abduction Ball b/n knees squeeze Fast walk 3 laps  More square 4 blocks side to side only with CGA  04/12/24 Leg curls 35# 3x10 Leg extension 5# 2.5# marches 2.5# hip abduction Ball squeeze Nustep level 6 x 6 minutes On airex 10# straight arm pulls More square 2blocks high On airex ball toss and bounce Fast walk 2 laps  04/07/24 Nustep level 5 x 7 minutes Leg curls 35# Leg ext 5# 5# marches, hip abduction and extension in Pbars Sit to stand with weight Side stepping Fast walking 4 laps Back to wall back to wall ball OHP working on posture  03/24/24 Leg curls 35# 3x10 Leg extension 5# 3x10 Gait outside long walk all the way around the back building, with a seated rest break at a picnic table, walking up the hill had to use the hand rail on the side walk, standing rest break toward the top, then another standing rest break before we made it back to the building Discussion on building strength and endurance as well as being  safe, using the SPC on long walks or when outside , not using the cane when feeling good and when inside  03/22/24 Leg curls 35# LEg ext 5# Nustep level 5 x 6 minutes Gait outside with SPC down the hill around [place] at the bottom and then back with 3-4 rest breaks Moresquare 2 and 3 blocks step overs On airex ball catch some difficulty with LOB Side step on and off airex Obstacle course step over 1/2 and full bolster and then step up and down a 6 step with SPC and CGA  03/17/24 Leg curls 35# Leg extension 5# On airex 10# straight arm pulls More square 2 blocks step overs all directions Sit to stand with weighted ball 2x5 Nustep level 5 x 7 minutes 6  step ups holding on needs cues to push with the left leg and extend the knee 40# resisted gait  all directions with CGA  03/15/24 Nustep level 5 x 8 minutes Leg curls 35# Leg ext 5# On airex ball toss On airex cone toe touch with SPC Side step on and off aires 40# resisted walking all directions 8 toe clears More square 2 blocks step overs front to back and side to side   PATIENT EDUCATION:  Education details: POC/Reviewed HEP Person educated: Patient Education method: Explanation Education comprehension: verbalized understanding  HOME EXERCISE PROGRAM: From hospital   ASSESSMENT:  CLINICAL IMPRESSION: Patient with an issue with the right toe over the weekend, reports that he thinks it is gout, has no hx of this, will see the MD today .  He will tend to use the back of the knees to help get up, he reports difficulty getting up when he is out at a restaurant  and today reported some right anterior hip pain with lifting leg up and with getting in and out of bed.  Patient is a 89 y.o. male who was seen today for physical therapy evaluation and treatment for s/p left THA posterior approach.   He was having significant pain prior to the surgery so he had been limited with his ability.  He reports that for the most part he is  without pain and only takes Tylenol .  He really struggles to get up from sitting, could not complete the 5XSTS test due to difficulty and pain.    OBJECTIVE IMPAIRMENTS: Abnormal gait, cardiopulmonary status limiting activity, decreased activity tolerance, decreased balance, decreased endurance, decreased mobility, difficulty walking, decreased ROM, decreased strength, increased edema, increased muscle spasms, impaired flexibility, postural dysfunction, and pain.   ACTIVITY LIMITATIONS: carrying, lifting, sitting, standing, squatting, sleeping, stairs, transfers, bed mobility, bathing, and locomotion level  PARTICIPATION LIMITATIONS: cleaning, laundry, driving, shopping, community activity, and yard work  PERSONAL FACTORS: Age, Fitness, Past/current experiences, 1 comorbidity:  , and 3+ comorbidities: as above are also affecting patient's functional outcome.   REHAB POTENTIAL: Good  CLINICAL DECISION MAKING: Evolving/moderate complexity  EVALUATION COMPLEXITY: Moderate   GOALS: Goals reviewed with patient? Yes  SHORT TERM GOALS: Target date: 02/25/24 Independent with initial HEP Baseline: Goal status:met 02/11/24   LONG TERM GOALS: Target date: 04/26/24  Independent with advanced HEP Baseline:  Goal status: progressing 03/10/24  2.  Go up and down stairs step over step Baseline:  Goal status: progressing 03/22/24  3.  Get up from sitting without hands Baseline:  Goal status: progressing 03/10/24 still struggling 04/14/24  4.  Decrease TUG time to 15 seconds Baseline: 32 seconds Goal status: met 04/01/24  04/14/24 able to do in 10 seconds  5.  Walk without device or SPC for all distances Baseline:  Goal status: met 02/25/24  6.  Walk 400 feet for Baseline:  Goal status: progressing 04/14/24   PLAN:  PT FREQUENCY: 2x/week  PT DURATION: 12 weeks  PLANNED INTERVENTIONS: 97164- PT Re-evaluation, 97110-Therapeutic exercises, 97530- Therapeutic activity, 97112-  Neuromuscular re-education, 97535- Self Care, [id number]- Manual therapy, [phone]- Gait training, Patient/Family education, Balance training, Stair training, and Cryotherapy  PLAN FOR NEXT SESSION: advance walking, endurance, strength and function, May need to ask for more visits   Ovie Cornelio W, PT 04/19/2024, 10:57 AM

## 2024-04-19 NOTE — Patient Instructions (Signed)
 I will check a uric acid level for you- if it is high gout is much more likely If you do appear to have gout I can call in some colchicine for you to have in case this happens again  Please keep me posted

## 2024-04-20 ENCOUNTER — Encounter: Payer: Self-pay | Admitting: Family Medicine

## 2024-04-20 LAB — URIC ACID: Uric Acid, Serum: 4.3 mg/dL (ref 4.0–7.8)

## 2024-04-26 ENCOUNTER — Ambulatory Visit: Admitting: Physical Therapy

## 2024-04-26 ENCOUNTER — Encounter: Payer: Self-pay | Admitting: Physical Therapy

## 2024-04-26 DIAGNOSIS — R2681 Unsteadiness on feet: Secondary | ICD-10-CM

## 2024-04-26 DIAGNOSIS — M25552 Pain in left hip: Secondary | ICD-10-CM

## 2024-04-26 DIAGNOSIS — R252 Cramp and spasm: Secondary | ICD-10-CM

## 2024-04-26 DIAGNOSIS — R262 Difficulty in walking, not elsewhere classified: Secondary | ICD-10-CM

## 2024-04-26 DIAGNOSIS — R2689 Other abnormalities of gait and mobility: Secondary | ICD-10-CM

## 2024-04-26 DIAGNOSIS — M6281 Muscle weakness (generalized): Secondary | ICD-10-CM

## 2024-04-26 NOTE — Therapy (Signed)
 " OUTPATIENT PHYSICAL THERAPY LOWER EXTREMITY TREATMENT     Patient Name: Connor [name] MRN: [id number] DOB:August 21, 1933, 89 y.o., male Today's Date: 04/26/2024  END OF SESSION:  PT End of Session - 04/26/24 1016     Visit Number 23    Number of Visits 24    Date for Recertification  05/05/24    Authorization Type [employer] 23 of 24    PT Start Time 1013    PT Stop Time 1058    PT Time Calculation (min) 45 min    Activity Tolerance Patient tolerated treatment well    Behavior During Therapy WFL for tasks assessed/performed          Past Medical History:  Diagnosis Date   Allergy 1985   Anemia    Benign localized prostatic hyperplasia with lower urinary tract symptoms (LUTS)    urologist-- dr matilda   Cancer [facility]) 07/2018   prostate   Cataract 12/2012   CHF (congestive heart failure) (HCC) ?   Chronic anticoagulation    Xarelto  for afib   Chronic constipation    Chronic cystitis    Chronic kidney disease    Clotting disorder    GERD (gastroesophageal reflux disease)    Gross hematuria    Heart murmur ?   History of DVT of lower extremity 06/08/2010   right femoral dvt dx post op total hip 05-08-2010   History of gastric ulcer    remote   History of kidney stones    one stone. remote hx   Hypercholesteremia    Hyperlipidemia ?   Hypertension    Dx by Dr. Salena Skill around age  42    Macular degeneration    dry in both eyes,  no injections  sees Dr. Alvia   NICM (nonischemic cardiomyopathy) [facility])    per echo 06-11-2017,  ef 45-50% with diffuse hypokinesis,  G1DD   OSA on CPAP 2017   compliant with CPAP.  managed by Dr Jude.   Osteoarthritis    Other urethral stricture, male, meatal    chronic;  pt self dilates   Paroxysmal atrial fibrillation [facility]) cardiologist-- dr kelsie   first dx 2009/   a. documented by Dr Comer on office EKG 9/12. b. maintained on Tikosyn  and Xarelto . c. s/p DCCV 's.  d.  x3  EP w/ ablation atrial fib last one 12-05-2016    Pre-diabetes    Premature ventricular contraction    Presence of Watchman left atrial appendage closure device 05/30/2022   Watchman FLX 24mm by Dr. Cindie   PVC's (premature ventricular contractions)    RA (rheumatoid arthritis) [facility])    rheumatologist--- Dr LORELEI Jacob,  treated with Humira   RBBB (right bundle branch block)    Hx of a fib, ablation Aug 2018   PVCs,   Pt had Watchman placed 05-30-2022   Rheumatoid arthritis (HCC)    Sleep apnea    Ulcer    Vitamin D  deficiency    Wears hearing aid in both ears    Past Surgical History:  Procedure Laterality Date   ABLATION OF DYSRHYTHMIC FOCUS  08/29/2015   ATRIAL FIBRILLATION ABLATION  12/05/2016   ATRIAL FIBRILLATION ABLATION N/A 12/05/2016   Procedure: Atrial Fibrillation Ablation;  Surgeon: Kelsie Lynwood, MD;  Location: [facility] INVASIVE CV LAB;  Service: Cardiovascular;  Laterality: N/A;   ATRIAL FIBRILLATION ABLATION N/A 02/29/2020   Procedure: ATRIAL FIBRILLATION ABLATION;  Surgeon: Kelsie Lynwood, MD;  Location: [facility] INVASIVE CV LAB;  Service: Cardiovascular;  Laterality: N/A;   ATRIAL FLUTTER ABLATION  09/2010   by MILUS EDWARDS Right 1990s   CARDIOVERSION N/A 10/28/2012   Procedure: CARDIOVERSION;  Surgeon: Lonni JONETTA Cash, MD;  Location: [facility] OR;  Service: Cardiovascular;  Laterality: N/A;   CATARACT EXTRACTION W/ INTRAOCULAR LENS  IMPLANT, BILATERAL  2019   CYSTOSCOPY WITH BIOPSY N/A 09/03/2018   Procedure: CYSTOSCOPY WITH FULGURATION CELESTA;  Surgeon: Matilda Senior, MD;  Location: [facility] ORS;  Service: Urology;  Laterality: N/A;  30 MINS   CYSTOSCOPY WITH FULGERATION N/A 01/23/2022   Procedure: CYSTOSCOPY WITH FULGERATION OF PROSTATIC URETHRA;  Surgeon: Matilda Senior, MD;  Location: [facility] ORS;  Service: Urology;  Laterality: N/A;  30 MINS   DENTAL SURGERY     ELECTROPHYSIOLOGIC STUDY N/A 08/29/2015   Procedure: Atrial Fibrillation Ablation;  Surgeon: Lynwood Rakers, MD;  Location: [facility] INVASIVE CV LAB;  Service:  Cardiovascular;  Laterality: N/A;   EYE SURGERY     Cataracts   JOINT REPLACEMENT  1995, 1997   KNEE SURGERY Bilateral x3   1960s & 1970s   open   LEFT ATRIAL APPENDAGE OCCLUSION N/A 05/30/2022   Procedure: LEFT ATRIAL APPENDAGE OCCLUSION;  Surgeon: Cindie Ole DASEN, MD;  Location: [facility] INVASIVE CV LAB;  Service: Cardiovascular;  Laterality: N/A;   LEFT HEART CATHETERIZATION WITH CORONARY ANGIOGRAM N/A 06/07/2011   Procedure: LEFT HEART CATHETERIZATION WITH CORONARY ANGIOGRAM;  Surgeon: Peter M Jordan, MD;  Location: [facility] CATH LAB;  Service: Cardiovascular;  Laterality: N/A;    Normal coronary arteries,  low normal LVF   TEE WITHOUT CARDIOVERSION N/A 05/30/2022   Procedure: TRANSESOPHAGEAL ECHOCARDIOGRAM (TEE);  Surgeon: Cindie Ole DASEN, MD;  Location: [facility] INVASIVE CV LAB;  Service: Cardiovascular;  Laterality: N/A;   TONSILLECTOMY AND ADENOIDECTOMY  age 43   TOTAL HIP ARTHROPLASTY Right 06/04/10    dr jane  @[facility]    TOTAL HIP ARTHROPLASTY Left 01/07/2024   Procedure: ARTHROPLASTY, HIP, TOTAL,POSTERIOR APPROACH;  Surgeon: Edna Toribio LABOR, MD;  Location: [facility] ORS;  Service: Orthopedics;  Laterality: Left;   TOTAL KNEE ARTHROPLASTY Bilateral right 1995;  left 1997   TRANSURETHRAL RESECTION OF PROSTATE  06-29-2002   dr humphries  @[facility]    TRANSURETHRAL RESECTION OF PROSTATE N/A 07/06/2018   Procedure: TRANSURETHRAL RESECTION OF THE PROSTATE (TURP);  Surgeon: Matilda Senior, MD;  Location: [facility];  Service: Urology;  Laterality: N/A;  45 MINS   Patient Active Problem List   Diagnosis Date Noted   Primary osteoarthritis of left hip 01/07/2024   Pain due to onychomycosis of toenails of both feet 07/03/2023   Atrial fibrillation (HCC) 05/30/2022   Presence of Watchman left atrial appendage closure device 05/30/2022   Hematuria 01/04/2022   Gross hematuria 01/04/2022   Secondary hypercoagulable state 03/28/2020   Malignant neoplasm of prostate (HCC) 09/11/2018   Enlarged  prostate with urinary obstruction 07/06/2018   Gastrocnemius strain, right, initial encounter 12/10/2017   Baker's cyst of knee, right 12/10/2017   Obesity 03/24/2017   Paroxysmal atrial fibrillation (HCC) 12/05/2016   Near syncope 04/20/2016   Leg hematoma, left, initial encounter 04/20/2016   Bleeding    Ecchymosis    Left hamstring muscle strain    Muscle tear    RBBB 08/30/2015   Chronic systolic dysfunction of left ventricle 07/11/2014   OSA (obstructive sleep apnea) 04/07/2013   Multinodular goiter 01/20/2013   Cervical spondylosis without myelopathy 01/18/2013   Left ventricular dysfunction 12/17/2011   Low back pain 09/11/2011   Fatigue 05/22/2011  PVC (premature ventricular contraction) 01/18/2011   Persistent atrial fibrillation (HCC) 01/12/2011   Hyperlipidemia 08/02/2010   Hypertension 08/02/2010   Osteoarthritis 08/02/2010   BPH (benign prostatic hyperplasia) 08/02/2010   GERD (gastroesophageal reflux disease) 08/02/2010   h/o Atrial flutter (HCC) s/p ablation 2012     PCP: Copland, MD  REFERRING PROVIDER: Edna, MD  REFERRING DIAG: S/P Left THA posterior  THERAPY DIAG:  Pain in left hip  Muscle weakness (generalized)  Unsteadiness on feet  Difficulty in walking, not elsewhere classified  Other abnormalities of gait and mobility  Cramp and spasm  Rationale for Evaluation and Treatment: Rehabilitation  ONSET DATE: 01/07/24  SUBJECTIVE:   SUBJECTIVE STATEMENT: Patient reports that his toe is feeling better, had some increased pain in the right thigh with the sit to stand activities  Patient underwent left posterior THA on 01/07/24, he has had Home PT until last week.  He reports that he has very little pain, just fatigue.  He reports that he has been doing the HEP given from the hospital.    PERTINENT HISTORY: A fib, prostate CA, LBP. HLD, HTN, OA, RA, bilateral TKA PAIN:  Are you having pain? Yes: NPRS scale: 0/10 Pain location: left  hip/back/thigh Pain description: spasm Aggravating factors: getting up from the chair pain up to 5/10 Relieving factors: rest, Tylenol , can have no pain  PRECAUTIONS: Posterior hip  RED FLAGS: None   WEIGHT BEARING RESTRICTIONS: No  FALLS:  Has patient fallen in last 6 months? No  LIVING ENVIRONMENT: Lives with: lives with their family and lives with their spouse Lives in: House/apartment Stairs: Yes: Internal: 14 steps; can reach both Has following equipment at home: Vannie - 2 wheeled  OCCUPATION: retired  PLOF: Independent and shopping, no device, some yard work  PATIENT GOALS: I want to walk without any device, I want to be able to do yard work  NEXT MD VISIT: October 16  OBJECTIVE:  Note: Objective measures were completed at Evaluation unless otherwise noted.  DIAGNOSTIC FINDINGS: N/A   COGNITION: Overall cognitive status: Within functional limits for tasks assessed     SENSATION: WFL  EDEMA:  Has visible edema in the left thigh and knee  MUSCLE LENGTH: Tight calves and HS  POSTURE: rounded shoulders, forward head, and decreased lumbar lordosis  PALPATION: Mild tenderness in the left thigh and hip, buttock  LOWER EXTREMITY ROM:  Active ROM Right eval Left eval  Hip flexion  45  Hip extension    Hip abduction  10  Hip adduction    Hip internal rotation    Hip external rotation    Knee flexion    Knee extension    Ankle dorsiflexion    Ankle plantarflexion    Ankle inversion    Ankle eversion     (Blank rows = not tested)  LOWER EXTREMITY MMT:  MMT Right eval Left eval  Hip flexion  3+  Hip extension    Hip abduction  3+  Hip adduction    Hip internal rotation    Hip external rotation    Knee flexion  4-  Knee extension  4-  Ankle dorsiflexion    Ankle plantarflexion    Ankle inversion    Ankle eversion     (Blank rows = not tested)  FUNCTIONAL TESTS:  5 times sit to stand: unable to do without hands, had to use the arm  rests and could only complete 3 due to pain and fatigue took 32 seconds to do  3 of the 5 Timed up and go (TUG): 32 seconds with FWW.  02/11/24 20 seconds with SPC 3 minute walk test: 200 feet with the FWW  GAIT: Distance walked: 200 feet Assistive device utilized: Walker - 2 wheeled Level of assistance: SBA Comments: slow, left toe slightly turns in                                                                                                                                TREATMENT DATE:  04/26/24 Nustep level 5 x 6 minutes 35# leg curls 5# leg extension 50# resisted gait 3# marches 3# hip ext 3# hip abduction Ball b/n knees squeeze Blue tband clamshells On airex cone toe touch Side step on and off airex On airex ball toss and bounce  04/19/24 Nustep level 5 x 6 minutes 35# leg curls 5# leg extension 3# marches 3# abduction Ball b/n knees Sit to stand with weighted ball Feet on ball K2C, rotation, small bridge, isometric abs Supine marches  04/14/24 Nustep level 5 x 6 minutes TUG10 seconds  5XSTS 23 seconds Sit to stand with 10# weight Ball kicks Leg curls 35# 5# leg extension On airex 10# straight arm pulls 5# Marches fatigue in the left thigh with this increased weight 5# hip abduction Ball b/n knees squeeze Fast walk 3 laps  More square 4 blocks side to side only with CGA  04/12/24 Leg curls 35# 3x10 Leg extension 5# 2.5# marches 2.5# hip abduction Ball squeeze Nustep level 6 x 6 minutes On airex 10# straight arm pulls More square 2blocks high On airex ball toss and bounce Fast walk 2 laps  04/07/24 Nustep level 5 x 7 minutes Leg curls 35# Leg ext 5# 5# marches, hip abduction and extension in Pbars Sit to stand with weight Side stepping Fast walking 4 laps Back to wall back to wall ball OHP working on posture  03/24/24 Leg curls 35# 3x10 Leg extension 5# 3x10 Gait outside long walk all the way around the back building, with a seated rest  break at a picnic table, walking up the hill had to use the hand rail on the side walk, standing rest break toward the top, then another standing rest break before we made it back to the building Discussion on building strength and endurance as well as being safe, using the SPC on long walks or when outside , not using the cane when feeling good and when inside  03/22/24 Leg curls 35# LEg ext 5# Nustep level 5 x 6 minutes Gait outside with SPC down the hill around [place] at the bottom and then back with 3-4 rest breaks Moresquare 2 and 3 blocks step overs On airex ball catch some difficulty with LOB Side step on and off airex Obstacle course step over 1/2 and full bolster and then step up and down a 6 step with SPC and CGA  03/17/24 Leg curls 35# Leg extension 5# On  airex 10# straight arm pulls More square 2 blocks step overs all directions Sit to stand with weighted ball 2x5 Nustep level 5 x 7 minutes 6  step ups holding on needs cues to push with the left leg and extend the knee 40# resisted gait all directions with CGA  03/15/24 Nustep level 5 x 8 minutes Leg curls 35# Leg ext 5# On airex ball toss On airex cone toe touch with SPC Side step on and off aires 40# resisted walking all directions 8 toe clears More square 2 blocks step overs front to back and side to side   PATIENT EDUCATION:  Education details: POC/Reviewed HEP Person educated: Patient Education method: Explanation Education comprehension: verbalized understanding  HOME EXERCISE PROGRAM: From hospital   ASSESSMENT:  CLINICAL IMPRESSION: Patient reports toe is good, reports that he was sore after doing the sit to stands without hands, he does get soreness in the quad hip with marching, at times is unsteady with airex and needs CGA  Patient is a 89 y.o. male who was seen today for physical therapy evaluation and treatment for s/p left THA posterior approach.   He was having significant pain prior to  the surgery so he had been limited with his ability.  He reports that for the most part he is without pain and only takes Tylenol .  He really struggles to get up from sitting, could not complete the 5XSTS test due to difficulty and pain.    OBJECTIVE IMPAIRMENTS: Abnormal gait, cardiopulmonary status limiting activity, decreased activity tolerance, decreased balance, decreased endurance, decreased mobility, difficulty walking, decreased ROM, decreased strength, increased edema, increased muscle spasms, impaired flexibility, postural dysfunction, and pain.   ACTIVITY LIMITATIONS: carrying, lifting, sitting, standing, squatting, sleeping, stairs, transfers, bed mobility, bathing, and locomotion level  PARTICIPATION LIMITATIONS: cleaning, laundry, driving, shopping, community activity, and yard work  PERSONAL FACTORS: Age, Fitness, Past/current experiences, 1 comorbidity:  , and 3+ comorbidities: as above are also affecting patient's functional outcome.   REHAB POTENTIAL: Good  CLINICAL DECISION MAKING: Evolving/moderate complexity  EVALUATION COMPLEXITY: Moderate   GOALS: Goals reviewed with patient? Yes  SHORT TERM GOALS: Target date: 02/25/24 Independent with initial HEP Baseline: Goal status:met 02/11/24   LONG TERM GOALS: Target date: 04/26/24  Independent with advanced HEP Baseline:  Goal status: progressing 03/10/24  2.  Go up and down stairs step over step Baseline:  Goal status: progressing 03/22/24  3.  Get up from sitting without hands Baseline:  Goal status: progressing 03/10/24 still struggling 04/14/24  4.  Decrease TUG time to 15 seconds Baseline: 32 seconds Goal status: met 04/01/24  04/14/24 able to do in 10 seconds  5.  Walk without device or SPC for all distances Baseline:  Goal status: met 02/25/24  6.  Walk 400 feet for Baseline:  Goal status: progressing 04/14/24   PLAN:  PT FREQUENCY: 2x/week  PT DURATION: 12 weeks  PLANNED  INTERVENTIONS: 97164- PT Re-evaluation, 97110-Therapeutic exercises, 97530- Therapeutic activity, 97112- Neuromuscular re-education, 97535- Self Care, [id number]- Manual therapy, [phone]- Gait training, Patient/Family education, Balance training, Stair training, and Cryotherapy  PLAN FOR NEXT SESSION: advance walking, endurance, strength and function, May need to ask for more visits   Starlina Lapre W, PT 04/26/2024, 10:17 AM  "

## 2024-04-30 ENCOUNTER — Emergency Department (HOSPITAL_COMMUNITY)
Admission: EM | Admit: 2024-04-30 | Discharge: 2024-04-30 | Disposition: A | Discharge status: Home / Self Care | Attending: Emergency Medicine | Admitting: Emergency Medicine

## 2024-04-30 ENCOUNTER — Emergency Department (HOSPITAL_COMMUNITY)

## 2024-04-30 DIAGNOSIS — W010XXA Fall on same level from slipping, tripping and stumbling without subsequent striking against object, initial encounter: Secondary | ICD-10-CM | POA: Diagnosis not present

## 2024-04-30 DIAGNOSIS — Z96642 Presence of left artificial hip joint: Secondary | ICD-10-CM | POA: Insufficient documentation

## 2024-04-30 DIAGNOSIS — S20211A Contusion of right front wall of thorax, initial encounter: Secondary | ICD-10-CM | POA: Diagnosis not present

## 2024-04-30 DIAGNOSIS — Z7901 Long term (current) use of anticoagulants: Secondary | ICD-10-CM | POA: Insufficient documentation

## 2024-04-30 DIAGNOSIS — I482 Chronic atrial fibrillation, unspecified: Secondary | ICD-10-CM | POA: Diagnosis not present

## 2024-04-30 DIAGNOSIS — W19XXXA Unspecified fall, initial encounter: Secondary | ICD-10-CM

## 2024-04-30 DIAGNOSIS — Y92007 Garden or yard of unspecified non-institutional (private) residence as the place of occurrence of the external cause: Secondary | ICD-10-CM | POA: Insufficient documentation

## 2024-04-30 DIAGNOSIS — S298XXA Other specified injuries of thorax, initial encounter: Secondary | ICD-10-CM

## 2024-04-30 DIAGNOSIS — S29001A Unspecified injury of muscle and tendon of front wall of thorax, initial encounter: Secondary | ICD-10-CM | POA: Diagnosis present

## 2024-04-30 LAB — CBC
HCT: 38.4 % — ABNORMAL LOW (ref 39.0–52.0)
Hemoglobin: 12.4 g/dL — ABNORMAL LOW (ref 13.0–17.0)
MCH: 33.8 pg (ref 26.0–34.0)
MCHC: 32.3 g/dL (ref 30.0–36.0)
MCV: 104.6 fL — ABNORMAL HIGH (ref 80.0–100.0)
Platelets: 128 K/uL — ABNORMAL LOW (ref 150–400)
RBC: 3.67 MIL/uL — ABNORMAL LOW (ref 4.22–5.81)
RDW: 13.4 % (ref 11.5–15.5)
WBC: 9.5 K/uL (ref 4.0–10.5)
nRBC: 0 % (ref 0.0–0.2)

## 2024-04-30 LAB — BASIC METABOLIC PANEL WITH GFR
Anion gap: 9 (ref 5–15)
BUN: 18 mg/dL (ref 8–23)
CO2: 25 mmol/L (ref 22–32)
Calcium: 8.5 mg/dL — ABNORMAL LOW (ref 8.9–10.3)
Chloride: 109 mmol/L (ref 98–111)
Creatinine, Ser: 0.6 mg/dL — ABNORMAL LOW (ref 0.61–1.24)
GFR, Estimated: >60 mL/min
Glucose, Bld: 116 mg/dL — ABNORMAL HIGH (ref 70–99)
Potassium: 4.2 mmol/L (ref 3.5–5.1)
Sodium: 143 mmol/L (ref 135–145)

## 2024-04-30 NOTE — ED Provider Notes (Signed)
 " [place] EMERGENCY DEPARTMENT AT [facility] Provider Note   CSN: [id number] Arrival date & time: 04/30/24  1319     Patient presents with: Connor [name] Connor [name] is a 89 y.o. male.    Fall     Patient has a history of hyperlipidemia, atrial fibrillation on anticoagulation, nonischemic cardiomyopathy, chronic bundle branch block, rheumatoid arthritis, acid reflux.  Patient states he was walking outside when he stumbled on the side of the concrete.  He ended up falling to his yard.  Patient states he primarily is having pain in the right rib area.  He also thinks he might of strained the muscles in his left hip when he was trying to get up.  He is having some pain in the left hip.  He did have prior hip replacement surgery in September.  He denies any difficulty breathing.  No abdominal pain.  No headache or loss of consciousness  Prior to Admission medications  Medication Sig Start Date End Date Taking? Authorizing Provider  atorvastatin  (LIPITOR) 10 MG tablet TAKE 1 TABLET BY MOUTH EVERY DAY 01/06/24   Copland, Jessica C, MD  Carboxymethylcellulose Sodium (THERATEARS OP) Apply 1 drop to eye daily as needed (dry eyes).    [provider]  cephALEXin  (KEFLEX ) 500 MG capsule Take 2,000 mg by mouth as directed. Dental appointments 07/02/22   [provider]  Cholecalciferol  (VITAMIN D3) 5000 UNITS TABS Take 5,000 Units by mouth every morning.    [provider]  CRANBERRY PO Take 9,600 mg by mouth 2 (two) times daily. 4800 mg each    [provider]  HUMIRA PEN 40 MG/0.4ML PNKT Inject 40 mg as directed every 7 (seven) days. Every Tuesday 03/26/18   [provider]  lisinopril  (ZESTRIL ) 40 MG tablet Take 1 tablet (40 mg total) by mouth daily. 03/22/24   Copland, Jessica C, MD  Melatonin 10 MG CHEW Chew 3 Pieces of gum by mouth at bedtime. gummy    [provider]  metoprolol  succinate (TOPROL -XL) 50 MG 24 hr tablet Take 1  tablet (50 mg total) by mouth 2 (two) times daily. Take with or immediately following a meal. 10/29/23   Crenshaw, Redell RAMAN, MD  Multiple Vitamins-Minerals (PRESERVISION AREDS 2) CAPS Take 1 capsule by mouth 2 (two) times a day.    [provider]  MYRBETRIQ 25 MG TB24 tablet Take 25 mg by mouth daily. 04/16/24   [provider]  pantoprazole  (PROTONIX ) 40 MG tablet TAKE 1 TABLET BY MOUTH EVERY DAY 04/05/24   Pietro Redell RAMAN, MD  senna (SENOKOT) 8.6 MG TABS tablet Take 2 tablets by mouth at bedtime.    [provider]  solifenacin (VESICARE) 10 MG tablet Take 10 mg by mouth at bedtime.  Patient not taking: Reported on 04/19/2024    [provider]  tamsulosin  (FLOMAX ) 0.4 MG CAPS capsule Take 1 capsule (0.4 mg total) by mouth daily after supper. 12/25/18   Patrcia Cough, MD  Testosterone  20.25 MG/ACT (1.62%) GEL Apply 1 Pump topically once a week. Apply to shoulders 09/01/21   [provider]  vitamin B-12 (CYANOCOBALAMIN ) 500 MCG tablet Take 500 mcg by mouth every evening.     [provider]    Allergies: Penicillins    Review of Systems  Updated Vital Signs BP (!) 162/80   Pulse 66   Temp 97.8 F (36.6 C)   Resp 17   SpO2 96%   Physical Exam Vitals  and nursing note reviewed.  Constitutional:      Appearance: He is well-developed. He is not diaphoretic.  HENT:     Head: Normocephalic and atraumatic.     Right Ear: External ear normal.     Left Ear: External ear normal.  Eyes:     General: No scleral icterus.       Right eye: No discharge.        Left eye: No discharge.     Conjunctiva/sclera: Conjunctivae normal.  Neck:     Trachea: No tracheal deviation.  Cardiovascular:     Rate and Rhythm: Normal rate and regular rhythm.  Pulmonary:     Effort: Pulmonary effort is normal. No respiratory distress.     Breath sounds: Normal breath sounds. No stridor. No wheezing or rales.  Abdominal:     General: Bowel sounds are  normal. There is no distension.     Palpations: Abdomen is soft.     Tenderness: There is abdominal tenderness. There is no guarding or rebound.     Comments: Mild tenderness palpation lateral right ribs  Musculoskeletal:        General: No tenderness or deformity.     Cervical back: Normal and neck supple. No tenderness.     Thoracic back: No tenderness.     Lumbar back: No tenderness.     Comments: No ttp right hip,  ttp left hip, pt is able to lift it off the bed but ROM limited compared to right, no ttp knees or ankles, no ttp upper extremities  Skin:    General: Skin is warm and dry.     Findings: No rash.  Neurological:     General: No focal deficit present.     Mental Status: He is alert.     Cranial Nerves: No cranial nerve deficit, dysarthria or facial asymmetry.     Sensory: No sensory deficit.     Motor: No abnormal muscle tone or seizure activity.     Coordination: Coordination normal.  Psychiatric:        Mood and Affect: Mood normal.     (all labs ordered are listed, but only abnormal results are displayed) Labs Reviewed  CBC - Abnormal; Notable for the following components:      Result Value   RBC 3.67 (*)    Hemoglobin 12.4 (*)    HCT 38.4 (*)    MCV 104.6 (*)    Platelets 128 (*)    All other components within normal limits  BASIC METABOLIC PANEL WITH GFR - Abnormal; Notable for the following components:   Glucose, Bld 116 (*)    Creatinine, Ser 0.60 (*)    Calcium  8.5 (*)    All other components within normal limits    EKG: EKG Interpretation Date/Time:  Friday April 30 2024 13:33:44 EST Ventricular Rate:  67 PR Interval:  175 QRS Duration:  149 QT Interval:  469 QTC Calculation: 496 R Axis:   23  Text Interpretation: Sinus rhythm Right bundle branch block No significant change since last tracing Confirmed by Randol Simmonds [phone]) on 04/30/2024 1:45:37 PM  Radiology: ARCOLA Ribs Unilateral W/Chest Right Result Date: 04/30/2024 CLINICAL DATA:   Lower back pain after fall today EXAM: RIGHT RIBS AND CHEST - 3+ VIEW COMPARISON:  None Available. FINDINGS: No fracture or other bone lesions are seen involving the ribs. There is no evidence of pneumothorax or pleural effusion. Both lungs are clear. Heart size and mediastinal contours are within normal limits.  IMPRESSION: Negative. Electronically Signed   By: Lynwood Landy Raddle M.D.   On: 04/30/2024 15:27   DG Hip Unilat W or Wo Pelvis 2-3 Views Left Result Date: 04/30/2024 CLINICAL DATA:  Left hip pain after fall EXAM: DG HIP (WITH OR WITHOUT PELVIS) 2-3V LEFT COMPARISON:  None Available. FINDINGS: Status post bilateral total hip arthroplasties. No fracture or dislocation is noted. IMPRESSION: Negative. Electronically Signed   By: Lynwood Landy Raddle M.D.   On: 04/30/2024 15:25     Procedures   Medications Ordered in the ED - No data to display  Clinical Course as of 04/30/24 1602  Fri Apr 30, 2024  1523 Basic metabolic panel(!) nl [JK]  1524 CBC(!) Hgb stable [JK]  1528 Hip x-ray without acute findings [JK]  1553 Rib xrays negative [JK]    Clinical Course User Index [JK] Randol Simmonds, MD                                 Medical Decision Making Problems Addressed: Contusion of rib, initial encounter: acute illness or injury that poses a threat to life or bodily functions Fall, initial encounter: acute illness or injury that poses a threat to life or bodily functions  Amount and/or Complexity of Data Reviewed Labs: ordered. Decision-making details documented in ED Course. Radiology: ordered and independent interpretation performed.   Patient presented to the ED primarily with complaints of right sided rib pain after fall.  Patient also complained of some mild left hip discomfort.  The ED patient did not require any pain medications.  His x-ray has a rib fracture.  No pneumothorax.  Blood tests are reassuring.  Hip xray Does not show fracture or dislocation.    Pt feels comfortable  going home.  Will manage pain with tylenol      Final diagnoses:  Fall, initial encounter  Contusion of rib, initial encounter    ED Discharge Orders     None          Randol Simmonds, MD 04/30/24 1606  "

## 2024-04-30 NOTE — Discharge Instructions (Signed)
 The x-rays did not show any signs of rib fractures.  Your hip x-ray did not show any signs of broken bone or dislocation.  Take over-the-counter medication such as Tylenol  to help with the pain.  Return as needed for worsening symptoms, shortness of breath

## 2024-04-30 NOTE — ED Notes (Signed)
 Pt educated on DC instructions and verbalizes understanding.

## 2024-04-30 NOTE — ED Triage Notes (Signed)
 Pt was walking, tripped on uneven payment and landed in yard (grass), pt c/o pain in mid back and around left hip. Pt reports hip replacement September 2025. HX afib, not on thinner x 1 month, denies LOC, or hitting head, pt reports just tripped over pavement in driveway.

## 2024-05-05 ENCOUNTER — Ambulatory Visit: Admitting: Physical Therapy

## 2024-05-05 ENCOUNTER — Encounter: Payer: Self-pay | Admitting: Physical Therapy

## 2024-05-05 DIAGNOSIS — R2681 Unsteadiness on feet: Secondary | ICD-10-CM

## 2024-05-05 DIAGNOSIS — R2689 Other abnormalities of gait and mobility: Secondary | ICD-10-CM

## 2024-05-05 DIAGNOSIS — M25552 Pain in left hip: Secondary | ICD-10-CM | POA: Diagnosis not present

## 2024-05-05 DIAGNOSIS — M6281 Muscle weakness (generalized): Secondary | ICD-10-CM

## 2024-05-05 DIAGNOSIS — R262 Difficulty in walking, not elsewhere classified: Secondary | ICD-10-CM

## 2024-05-05 DIAGNOSIS — R252 Cramp and spasm: Secondary | ICD-10-CM

## 2024-05-05 NOTE — Therapy (Signed)
 " OUTPATIENT PHYSICAL THERAPY LOWER EXTREMITY TREATMENT     Patient Name: Connor [name] MRN: [id number] DOB:Sep 08, 1933, 89 y.o., male Today's Date: 05/05/2024  END OF SESSION:  PT End of Session - 05/05/24 1056     Visit Number 24    Date for Recertification  05/05/24    Authorization Type [employer] 24 of 24    PT Start Time 1053    PT Stop Time 1140    PT Time Calculation (min) 47 min    Activity Tolerance Patient tolerated treatment well    Behavior During Therapy WFL for tasks assessed/performed          Past Medical History:  Diagnosis Date   Allergy 1985   Anemia    Benign localized prostatic hyperplasia with lower urinary tract symptoms (LUTS)    urologist-- dr matilda   Cancer [facility]) 07/2018   prostate   Cataract 12/2012   CHF (congestive heart failure) (HCC) ?   Chronic anticoagulation    Xarelto  for afib   Chronic constipation    Chronic cystitis    Chronic kidney disease    Clotting disorder    GERD (gastroesophageal reflux disease)    Gross hematuria    Heart murmur ?   History of DVT of lower extremity 06/08/2010   right femoral dvt dx post op total hip 05-08-2010   History of gastric ulcer    remote   History of kidney stones    one stone. remote hx   Hypercholesteremia    Hyperlipidemia ?   Hypertension    Dx by Dr. Salena Skill around age  64    Macular degeneration    dry in both eyes,  no injections  sees Dr. Alvia   NICM (nonischemic cardiomyopathy) [facility])    per echo 06-11-2017,  ef 45-50% with diffuse hypokinesis,  G1DD   OSA on CPAP 2017   compliant with CPAP.  managed by Dr Jude.   Osteoarthritis    Other urethral stricture, male, meatal    chronic;  pt self dilates   Paroxysmal atrial fibrillation [facility]) cardiologist-- dr kelsie   first dx 2009/   a. documented by Dr Comer on office EKG 9/12. b. maintained on Tikosyn  and Xarelto . c. s/p DCCV 's.  d.  x3  EP w/ ablation atrial fib last one 12-05-2016   Pre-diabetes    Premature  ventricular contraction    Presence of Watchman left atrial appendage closure device 05/30/2022   Watchman FLX 24mm by Dr. Cindie   PVC's (premature ventricular contractions)    RA (rheumatoid arthritis) [facility])    rheumatologist--- Dr LORELEI Jacob,  treated with Humira   RBBB (right bundle branch block)    Hx of a fib, ablation Aug 2018   PVCs,   Pt had Watchman placed 05-30-2022   Rheumatoid arthritis (HCC)    Sleep apnea    Ulcer    Vitamin D  deficiency    Wears hearing aid in both ears    Past Surgical History:  Procedure Laterality Date   ABLATION OF DYSRHYTHMIC FOCUS  08/29/2015   ATRIAL FIBRILLATION ABLATION  12/05/2016   ATRIAL FIBRILLATION ABLATION N/A 12/05/2016   Procedure: Atrial Fibrillation Ablation;  Surgeon: Kelsie Lynwood, MD;  Location: [facility] INVASIVE CV LAB;  Service: Cardiovascular;  Laterality: N/A;   ATRIAL FIBRILLATION ABLATION N/A 02/29/2020   Procedure: ATRIAL FIBRILLATION ABLATION;  Surgeon: Kelsie Lynwood, MD;  Location: [facility] INVASIVE CV LAB;  Service: Cardiovascular;  Laterality: N/A;   ATRIAL FLUTTER  ABLATION  09/2010   by MILUS EDWARDS Right 1990s   CARDIOVERSION N/A 10/28/2012   Procedure: CARDIOVERSION;  Surgeon: Lonni JONETTA Cash, MD;  Location: [facility] OR;  Service: Cardiovascular;  Laterality: N/A;   CATARACT EXTRACTION W/ INTRAOCULAR LENS  IMPLANT, BILATERAL  2019   CYSTOSCOPY WITH BIOPSY N/A 09/03/2018   Procedure: CYSTOSCOPY WITH FULGURATION CELESTA;  Surgeon: Matilda Senior, MD;  Location: [facility] ORS;  Service: Urology;  Laterality: N/A;  30 MINS   CYSTOSCOPY WITH FULGERATION N/A 01/23/2022   Procedure: CYSTOSCOPY WITH FULGERATION OF PROSTATIC URETHRA;  Surgeon: Matilda Senior, MD;  Location: [facility] ORS;  Service: Urology;  Laterality: N/A;  30 MINS   DENTAL SURGERY     ELECTROPHYSIOLOGIC STUDY N/A 08/29/2015   Procedure: Atrial Fibrillation Ablation;  Surgeon: Lynwood Rakers, MD;  Location: [facility] INVASIVE CV LAB;  Service: Cardiovascular;  Laterality:  N/A;   EYE SURGERY     Cataracts   JOINT REPLACEMENT  1995, 1997   KNEE SURGERY Bilateral x3   1960s & 1970s   open   LEFT ATRIAL APPENDAGE OCCLUSION N/A 05/30/2022   Procedure: LEFT ATRIAL APPENDAGE OCCLUSION;  Surgeon: Cindie Ole DASEN, MD;  Location: [facility] INVASIVE CV LAB;  Service: Cardiovascular;  Laterality: N/A;   LEFT HEART CATHETERIZATION WITH CORONARY ANGIOGRAM N/A 06/07/2011   Procedure: LEFT HEART CATHETERIZATION WITH CORONARY ANGIOGRAM;  Surgeon: Peter M Jordan, MD;  Location: [facility] CATH LAB;  Service: Cardiovascular;  Laterality: N/A;    Normal coronary arteries,  low normal LVF   TEE WITHOUT CARDIOVERSION N/A 05/30/2022   Procedure: TRANSESOPHAGEAL ECHOCARDIOGRAM (TEE);  Surgeon: Cindie Ole DASEN, MD;  Location: [facility] INVASIVE CV LAB;  Service: Cardiovascular;  Laterality: N/A;   TONSILLECTOMY AND ADENOIDECTOMY  age 30   TOTAL HIP ARTHROPLASTY Right 06/04/10    dr jane  @[facility]    TOTAL HIP ARTHROPLASTY Left 01/07/2024   Procedure: ARTHROPLASTY, HIP, TOTAL,POSTERIOR APPROACH;  Surgeon: Edna Toribio LABOR, MD;  Location: [facility] ORS;  Service: Orthopedics;  Laterality: Left;   TOTAL KNEE ARTHROPLASTY Bilateral right 1995;  left 1997   TRANSURETHRAL RESECTION OF PROSTATE  06-29-2002   dr humphries  @[facility]    TRANSURETHRAL RESECTION OF PROSTATE N/A 07/06/2018   Procedure: TRANSURETHRAL RESECTION OF THE PROSTATE (TURP);  Surgeon: Matilda Senior, MD;  Location: [facility];  Service: Urology;  Laterality: N/A;  45 MINS   Patient Active Problem List   Diagnosis Date Noted   Primary osteoarthritis of left hip 01/07/2024   Pain due to onychomycosis of toenails of both feet 07/03/2023   Atrial fibrillation (HCC) 05/30/2022   Presence of Watchman left atrial appendage closure device 05/30/2022   Hematuria 01/04/2022   Gross hematuria 01/04/2022   Secondary hypercoagulable state 03/28/2020   Malignant neoplasm of prostate (HCC) 09/11/2018   Enlarged prostate with urinary  obstruction 07/06/2018   Gastrocnemius strain, right, initial encounter 12/10/2017   Baker's cyst of knee, right 12/10/2017   Obesity 03/24/2017   Paroxysmal atrial fibrillation (HCC) 12/05/2016   Near syncope 04/20/2016   Leg hematoma, left, initial encounter 04/20/2016   Bleeding    Ecchymosis    Left hamstring muscle strain    Muscle tear    RBBB 08/30/2015   Chronic systolic dysfunction of left ventricle 07/11/2014   OSA (obstructive sleep apnea) 04/07/2013   Multinodular goiter 01/20/2013   Cervical spondylosis without myelopathy 01/18/2013   Left ventricular dysfunction 12/17/2011   Low back pain 09/11/2011   Fatigue 05/22/2011   PVC (premature ventricular contraction) 01/18/2011  Persistent atrial fibrillation (HCC) 01/12/2011   Hyperlipidemia 08/02/2010   Hypertension 08/02/2010   Osteoarthritis 08/02/2010   BPH (benign prostatic hyperplasia) 08/02/2010   GERD (gastroesophageal reflux disease) 08/02/2010   h/o Atrial flutter (HCC) s/p ablation 2012     PCP: Copland, MD  REFERRING PROVIDER: Edna, MD  REFERRING DIAG: S/P Left THA posterior  THERAPY DIAG:  Pain in left hip  Muscle weakness (generalized)  Unsteadiness on feet  Difficulty in walking, not elsewhere classified  Other abnormalities of gait and mobility  Cramp and spasm  Rationale for Evaluation and Treatment: Rehabilitation  ONSET DATE: 01/07/24  SUBJECTIVE:   SUBJECTIVE STATEMENT: Patient had a fall the day after Christmas, reports that he was on the driveway and he thinks the foot rolled when he was half on the driveway and fell into the yard, reports bad back pain, also right knee pain, and left hip strain, he had x-rays of back, ribs, knees and hips all negative.  Reports back pain a 9.5/10 when getting up and down  Patient underwent left posterior THA on 01/07/24, he has had Home PT until last week.  He reports that he has very little pain, just fatigue.  He reports that he has been  doing the HEP given from the hospital.    PERTINENT HISTORY: A fib, prostate CA, LBP. HLD, HTN, OA, RA, bilateral TKA PAIN:  Are you having pain? Yes: NPRS scale: 0/10 Pain location: left hip/back/thigh Pain description: spasm Aggravating factors: getting up from the chair pain up to 5/10 Relieving factors: rest, Tylenol , can have no pain  PRECAUTIONS: Posterior hip  RED FLAGS: None   WEIGHT BEARING RESTRICTIONS: No  FALLS:  Has patient fallen in last 6 months? No  LIVING ENVIRONMENT: Lives with: lives with their family and lives with their spouse Lives in: House/apartment Stairs: Yes: Internal: 14 steps; can reach both Has following equipment at home: Vannie - 2 wheeled  OCCUPATION: retired  PLOF: Independent and shopping, no device, some yard work  PATIENT GOALS: I want to walk without any device, I want to be able to do yard work  NEXT MD VISIT: October 16  OBJECTIVE:  Note: Objective measures were completed at Evaluation unless otherwise noted.  DIAGNOSTIC FINDINGS: N/A   COGNITION: Overall cognitive status: Within functional limits for tasks assessed     SENSATION: WFL  EDEMA:  Has visible edema in the left thigh and knee  MUSCLE LENGTH: Tight calves and HS  POSTURE: rounded shoulders, forward head, and decreased lumbar lordosis  PALPATION: Mild tenderness in the left thigh and hip, buttock  LOWER EXTREMITY ROM:  Active ROM Right eval Left eval  Hip flexion  45  Hip extension    Hip abduction  10  Hip adduction    Hip internal rotation    Hip external rotation    Knee flexion    Knee extension    Ankle dorsiflexion    Ankle plantarflexion    Ankle inversion    Ankle eversion     (Blank rows = not tested)  LOWER EXTREMITY MMT:  MMT Right eval Left eval  Hip flexion  3+  Hip extension    Hip abduction  3+  Hip adduction    Hip internal rotation    Hip external rotation    Knee flexion  4-  Knee extension  4-  Ankle  dorsiflexion    Ankle plantarflexion    Ankle inversion    Ankle eversion     (Blank rows =  not tested)  FUNCTIONAL TESTS:  5 times sit to stand: unable to do without hands, had to use the arm rests and could only complete 3 due to pain and fatigue took 32 seconds to do 3 of the 5 Timed up and go (TUG): 32 seconds with FWW.  02/11/24 20 seconds with SPC 3 minute walk test: 200 feet with the FWW  GAIT: Distance walked: 200 feet Assistive device utilized: Walker - 2 wheeled Level of assistance: SBA Comments: slow, left toe slightly turns in                                                                                                                                TREATMENT DATE:  05/05/24 Feet on ball K2C, rotation, small bridge, isometric abs SAQ bilaterally Ball b/n knees squeeze Blue tband clamshells Nustep level 5 x 6 minutes TUG 12 seconds 3 minute walk 400 feet STM to the right low back   04/26/24 Nustep level 5 x 6 minutes 35# leg curls 5# leg extension 50# resisted gait 3# marches 3# hip ext 3# hip abduction Ball b/n knees squeeze Blue tband clamshells On airex cone toe touch Side step on and off airex On airex ball toss and bounce  04/19/24 Nustep level 5 x 6 minutes 35# leg curls 5# leg extension 3# marches 3# abduction Ball b/n knees Sit to stand with weighted ball Feet on ball K2C, rotation, small bridge, isometric abs Supine marches  04/14/24 Nustep level 5 x 6 minutes TUG10 seconds  5XSTS 23 seconds Sit to stand with 10# weight Ball kicks Leg curls 35# 5# leg extension On airex 10# straight arm pulls 5# Marches fatigue in the left thigh with this increased weight 5# hip abduction Ball b/n knees squeeze Fast walk 3 laps  More square 4 blocks side to side only with CGA  04/12/24 Leg curls 35# 3x10 Leg extension 5# 2.5# marches 2.5# hip abduction Ball squeeze Nustep level 6 x 6 minutes On airex 10# straight arm pulls More square  2blocks high On airex ball toss and bounce Fast walk 2 laps  04/07/24 Nustep level 5 x 7 minutes Leg curls 35# Leg ext 5# 5# marches, hip abduction and extension in Pbars Sit to stand with weight Side stepping Fast walking 4 laps Back to wall back to wall ball OHP working on posture  03/24/24 Leg curls 35# 3x10 Leg extension 5# 3x10 Gait outside long walk all the way around the back building, with a seated rest break at a picnic table, walking up the hill had to use the hand rail on the side walk, standing rest break toward the top, then another standing rest break before we made it back to the building Discussion on building strength and endurance as well as being safe, using the SPC on long walks or when outside , not using the cane when feeling good and when inside  PATIENT EDUCATION:  Education details:  POC/Reviewed HEP Person educated: Patient Education method: Explanation Education comprehension: verbalized understanding  HOME EXERCISE PROGRAM: From hospital   ASSESSMENT:  CLINICAL IMPRESSION: Patient had a recent fall the day after Christmas, ankle stepped off driveway and he fell, has some increased pain in the back, right knee and left hip, all x-rays were negative but this is a set back as he really struggles to get up from sitting.  He was doing great and had not been using the cane, he is now back using the cane.  And a little slower with his walking  Patient is a 89 y.o. male who was seen today for physical therapy evaluation and treatment for s/p left THA posterior approach.   He was having significant pain prior to the surgery so he had been limited with his ability.  He reports that for the most part he is without pain and only takes Tylenol .  He really struggles to get up from sitting, could not complete the 5XSTS test due to difficulty and pain.    OBJECTIVE IMPAIRMENTS: Abnormal gait, cardiopulmonary status limiting activity, decreased activity tolerance,  decreased balance, decreased endurance, decreased mobility, difficulty walking, decreased ROM, decreased strength, increased edema, increased muscle spasms, impaired flexibility, postural dysfunction, and pain.   ACTIVITY LIMITATIONS: carrying, lifting, sitting, standing, squatting, sleeping, stairs, transfers, bed mobility, bathing, and locomotion level  PARTICIPATION LIMITATIONS: cleaning, laundry, driving, shopping, community activity, and yard work  PERSONAL FACTORS: Age, Fitness, Past/current experiences, 1 comorbidity:  , and 3+ comorbidities: as above are also affecting patient's functional outcome.   REHAB POTENTIAL: Good  CLINICAL DECISION MAKING: Evolving/moderate complexity  EVALUATION COMPLEXITY: Moderate   GOALS: Goals reviewed with patient? Yes  SHORT TERM GOALS: Target date: 02/25/24 Independent with initial HEP Baseline: Goal status:met 02/11/24   LONG TERM GOALS: Target date: 04/26/24  Independent with advanced HEP Baseline:  Goal status: progressing 05/05/24  2.  Go up and down stairs step over step Baseline:  Goal status: progressing 05/05/24  3.  Get up from sitting without hands Baseline:  Goal status: progressing 03/10/24 still struggling 04/14/24 really struggled today due to recent fall 05/05/24  4.  Decrease TUG time to 15 seconds Baseline: 32 seconds Goal status: met 04/01/24  04/14/24 able to do in 10 secondsmet 05/05/24  5.  Walk without device or SPC for all distances Baseline:  Goal status: met 02/25/24  6.  Walk 400 feet for Baseline:  Goal status: progressing 04/14/24, met 05/05/24   PLAN:  PT FREQUENCY: 2x/week  PT DURATION: 12 weeks  PLANNED INTERVENTIONS: 97164- PT Re-evaluation, 97110-Therapeutic exercises, 97530- Therapeutic activity, 97112- Neuromuscular re-education, 97535- Self Care, [id number]- Manual therapy, [phone]- Gait training, Patient/Family education, Balance training, Stair training, and Cryotherapy  PLAN FOR  NEXT SESSION: advance walking, endurance, strength and function, see if we can get him to recover from this fall  Tamatha Gadbois W, PT 05/05/2024, 10:57 AM  "

## 2024-05-12 ENCOUNTER — Encounter: Payer: Self-pay | Admitting: Physical Therapy

## 2024-05-12 ENCOUNTER — Ambulatory Visit: Attending: Family Medicine | Admitting: Physical Therapy

## 2024-05-12 DIAGNOSIS — R2689 Other abnormalities of gait and mobility: Secondary | ICD-10-CM | POA: Diagnosis present

## 2024-05-12 DIAGNOSIS — R262 Difficulty in walking, not elsewhere classified: Secondary | ICD-10-CM | POA: Insufficient documentation

## 2024-05-12 DIAGNOSIS — R2681 Unsteadiness on feet: Secondary | ICD-10-CM | POA: Diagnosis present

## 2024-05-12 DIAGNOSIS — M25552 Pain in left hip: Secondary | ICD-10-CM | POA: Insufficient documentation

## 2024-05-12 DIAGNOSIS — R252 Cramp and spasm: Secondary | ICD-10-CM | POA: Diagnosis present

## 2024-05-12 DIAGNOSIS — M6281 Muscle weakness (generalized): Secondary | ICD-10-CM | POA: Diagnosis present

## 2024-05-12 NOTE — Therapy (Signed)
 " OUTPATIENT PHYSICAL THERAPY LOWER EXTREMITY TREATMENT     Patient Name: Connor [name] MRN: [id number] DOB:07/10/1933, 89 y.o., male Today's Date: 05/12/2024  END OF SESSION:  PT End of Session - 05/12/24 1617     Visit Number 25    Date for Recertification  08/18/24    Authorization Type [employer] 1/12    PT Start Time 1614    PT Stop Time 1700    PT Time Calculation (min) 46 min    Activity Tolerance Patient tolerated treatment well    Behavior During Therapy [facility] for tasks assessed/performed          Past Medical History:  Diagnosis Date   Allergy 1985   Anemia    Benign localized prostatic hyperplasia with lower urinary tract symptoms (LUTS)    urologist-- dr matilda   Cancer [facility]) 07/2018   prostate   Cataract 12/2012   CHF (congestive heart failure) (HCC) ?   Chronic anticoagulation    Xarelto  for afib   Chronic constipation    Chronic cystitis    Chronic kidney disease    Clotting disorder    GERD (gastroesophageal reflux disease)    Gross hematuria    Heart murmur ?   History of DVT of lower extremity 06/08/2010   right femoral dvt dx post op total hip 05-08-2010   History of gastric ulcer    remote   History of kidney stones    one stone. remote hx   Hypercholesteremia    Hyperlipidemia ?   Hypertension    Dx by Dr. Salena Skill around age  51    Macular degeneration    dry in both eyes,  no injections  sees Dr. Alvia   NICM (nonischemic cardiomyopathy) [facility])    per echo 06-11-2017,  ef 45-50% with diffuse hypokinesis,  G1DD   OSA on CPAP 2017   compliant with CPAP.  managed by Dr Jude.   Osteoarthritis    Other urethral stricture, male, meatal    chronic;  pt self dilates   Paroxysmal atrial fibrillation [facility]) cardiologist-- dr kelsie   first dx 2009/   a. documented by Dr Comer on office EKG 9/12. b. maintained on Tikosyn  and Xarelto . c. s/p DCCV 's.  d.  x3  EP w/ ablation atrial fib last one 12-05-2016   Pre-diabetes    Premature  ventricular contraction    Presence of Watchman left atrial appendage closure device 05/30/2022   Watchman FLX 24mm by Dr. Cindie   PVC's (premature ventricular contractions)    RA (rheumatoid arthritis) [facility])    rheumatologist--- Dr LORELEI Jacob,  treated with Humira   RBBB (right bundle branch block)    Hx of a fib, ablation Aug 2018   PVCs,   Pt had Watchman placed 05-30-2022   Rheumatoid arthritis (HCC)    Sleep apnea    Ulcer    Vitamin D  deficiency    Wears hearing aid in both ears    Past Surgical History:  Procedure Laterality Date   ABLATION OF DYSRHYTHMIC FOCUS  08/29/2015   ATRIAL FIBRILLATION ABLATION  12/05/2016   ATRIAL FIBRILLATION ABLATION N/A 12/05/2016   Procedure: Atrial Fibrillation Ablation;  Surgeon: Kelsie Lynwood, MD;  Location: [facility] INVASIVE CV LAB;  Service: Cardiovascular;  Laterality: N/A;   ATRIAL FIBRILLATION ABLATION N/A 02/29/2020   Procedure: ATRIAL FIBRILLATION ABLATION;  Surgeon: Kelsie Lynwood, MD;  Location: [facility] INVASIVE CV LAB;  Service: Cardiovascular;  Laterality: N/A;   ATRIAL FLUTTER ABLATION  09/2010   by MILUS EDWARDS Right 1990s   CARDIOVERSION N/A 10/28/2012   Procedure: CARDIOVERSION;  Surgeon: Lonni JONETTA Cash, MD;  Location: [facility] OR;  Service: Cardiovascular;  Laterality: N/A;   CATARACT EXTRACTION W/ INTRAOCULAR LENS  IMPLANT, BILATERAL  2019   CYSTOSCOPY WITH BIOPSY N/A 09/03/2018   Procedure: CYSTOSCOPY WITH FULGURATION CELESTA;  Surgeon: Matilda Senior, MD;  Location: [facility] ORS;  Service: Urology;  Laterality: N/A;  30 MINS   CYSTOSCOPY WITH FULGERATION N/A 01/23/2022   Procedure: CYSTOSCOPY WITH FULGERATION OF PROSTATIC URETHRA;  Surgeon: Matilda Senior, MD;  Location: [facility] ORS;  Service: Urology;  Laterality: N/A;  30 MINS   DENTAL SURGERY     ELECTROPHYSIOLOGIC STUDY N/A 08/29/2015   Procedure: Atrial Fibrillation Ablation;  Surgeon: Lynwood Rakers, MD;  Location: [facility] INVASIVE CV LAB;  Service: Cardiovascular;  Laterality:  N/A;   EYE SURGERY     Cataracts   JOINT REPLACEMENT  1995, 1997   KNEE SURGERY Bilateral x3   1960s & 1970s   open   LEFT ATRIAL APPENDAGE OCCLUSION N/A 05/30/2022   Procedure: LEFT ATRIAL APPENDAGE OCCLUSION;  Surgeon: Cindie Ole DASEN, MD;  Location: [facility] INVASIVE CV LAB;  Service: Cardiovascular;  Laterality: N/A;   LEFT HEART CATHETERIZATION WITH CORONARY ANGIOGRAM N/A 06/07/2011   Procedure: LEFT HEART CATHETERIZATION WITH CORONARY ANGIOGRAM;  Surgeon: Peter M Jordan, MD;  Location: [facility] CATH LAB;  Service: Cardiovascular;  Laterality: N/A;    Normal coronary arteries,  low normal LVF   TEE WITHOUT CARDIOVERSION N/A 05/30/2022   Procedure: TRANSESOPHAGEAL ECHOCARDIOGRAM (TEE);  Surgeon: Cindie Ole DASEN, MD;  Location: [facility] INVASIVE CV LAB;  Service: Cardiovascular;  Laterality: N/A;   TONSILLECTOMY AND ADENOIDECTOMY  age 8   TOTAL HIP ARTHROPLASTY Right 06/04/10    dr jane  @[facility]    TOTAL HIP ARTHROPLASTY Left 01/07/2024   Procedure: ARTHROPLASTY, HIP, TOTAL,POSTERIOR APPROACH;  Surgeon: Edna Toribio LABOR, MD;  Location: [facility] ORS;  Service: Orthopedics;  Laterality: Left;   TOTAL KNEE ARTHROPLASTY Bilateral right 1995;  left 1997   TRANSURETHRAL RESECTION OF PROSTATE  06-29-2002   dr humphries  @[facility]    TRANSURETHRAL RESECTION OF PROSTATE N/A 07/06/2018   Procedure: TRANSURETHRAL RESECTION OF THE PROSTATE (TURP);  Surgeon: Matilda Senior, MD;  Location: [facility];  Service: Urology;  Laterality: N/A;  45 MINS   Patient Active Problem List   Diagnosis Date Noted   Primary osteoarthritis of left hip 01/07/2024   Pain due to onychomycosis of toenails of both feet 07/03/2023   Atrial fibrillation (HCC) 05/30/2022   Presence of Watchman left atrial appendage closure device 05/30/2022   Hematuria 01/04/2022   Gross hematuria 01/04/2022   Secondary hypercoagulable state 03/28/2020   Malignant neoplasm of prostate (HCC) 09/11/2018   Enlarged prostate with urinary  obstruction 07/06/2018   Gastrocnemius strain, right, initial encounter 12/10/2017   Baker's cyst of knee, right 12/10/2017   Obesity 03/24/2017   Paroxysmal atrial fibrillation (HCC) 12/05/2016   Near syncope 04/20/2016   Leg hematoma, left, initial encounter 04/20/2016   Bleeding    Ecchymosis    Left hamstring muscle strain    Muscle tear    RBBB 08/30/2015   Chronic systolic dysfunction of left ventricle 07/11/2014   OSA (obstructive sleep apnea) 04/07/2013   Multinodular goiter 01/20/2013   Cervical spondylosis without myelopathy 01/18/2013   Left ventricular dysfunction 12/17/2011   Low back pain 09/11/2011   Fatigue 05/22/2011   PVC (premature ventricular contraction) 01/18/2011  Persistent atrial fibrillation (HCC) 01/12/2011   Hyperlipidemia 08/02/2010   Hypertension 08/02/2010   Osteoarthritis 08/02/2010   BPH (benign prostatic hyperplasia) 08/02/2010   GERD (gastroesophageal reflux disease) 08/02/2010   h/o Atrial flutter (HCC) s/p ablation 2012     PCP: Copland, MD  REFERRING PROVIDER: Edna, MD  REFERRING DIAG: S/P Left THA posterior  THERAPY DIAG:  Pain in left hip  Muscle weakness (generalized)  Unsteadiness on feet  Difficulty in walking, not elsewhere classified  Other abnormalities of gait and mobility  Rationale for Evaluation and Treatment: Rehabilitation  ONSET DATE: 01/07/24  SUBJECTIVE:   SUBJECTIVE STATEMENT: Reports that the back is still hurting, he reports that he is walking better than the last time I saw him but not as good as before the fall.  Pain a 3/10  Patient had a fall the day after Christmas, reports that he was on the driveway and he thinks the foot rolled when he was half on the driveway and fell into the yard, reports bad back pain, also right knee pain, and left hip strain, he had x-rays of back, ribs, knees and hips all negative.  Reports back pain a 9.5/10 when getting up and down  Patient underwent left posterior  THA on 01/07/24, he has had Home PT until last week.  He reports that he has very little pain, just fatigue.  He reports that he has been doing the HEP given from the hospital.    PERTINENT HISTORY: A fib, prostate CA, LBP. HLD, HTN, OA, RA, bilateral TKA PAIN:  Are you having pain? Yes: NPRS scale: 0/10 Pain location: left hip/back/thigh Pain description: spasm Aggravating factors: getting up from the chair pain up to 5/10 Relieving factors: rest, Tylenol , can have no pain  PRECAUTIONS: Posterior hip  RED FLAGS: None   WEIGHT BEARING RESTRICTIONS: No  FALLS:  Has patient fallen in last 6 months? No  LIVING ENVIRONMENT: Lives with: lives with their family and lives with their spouse Lives in: House/apartment Stairs: Yes: Internal: 14 steps; can reach both Has following equipment at home: Vannie - 2 wheeled  OCCUPATION: retired  PLOF: Independent and shopping, no device, some yard work  PATIENT GOALS: I want to walk without any device, I want to be able to do yard work  NEXT MD VISIT: October 16  OBJECTIVE:  Note: Objective measures were completed at Evaluation unless otherwise noted.  DIAGNOSTIC FINDINGS: N/A   COGNITION: Overall cognitive status: Within functional limits for tasks assessed     SENSATION: WFL  EDEMA:  Has visible edema in the left thigh and knee  MUSCLE LENGTH: Tight calves and HS  POSTURE: rounded shoulders, forward head, and decreased lumbar lordosis  PALPATION: Mild tenderness in the left thigh and hip, buttock  LOWER EXTREMITY ROM:  Active ROM Right eval Left eval  Hip flexion  45  Hip extension    Hip abduction  10  Hip adduction    Hip internal rotation    Hip external rotation    Knee flexion    Knee extension    Ankle dorsiflexion    Ankle plantarflexion    Ankle inversion    Ankle eversion     (Blank rows = not tested)  LOWER EXTREMITY MMT:  MMT Right eval Left eval  Hip flexion  3+  Hip extension    Hip  abduction  3+  Hip adduction    Hip internal rotation    Hip external rotation    Knee flexion  4-  Knee extension  4-  Ankle dorsiflexion    Ankle plantarflexion    Ankle inversion    Ankle eversion     (Blank rows = not tested)  FUNCTIONAL TESTS:  5 times sit to stand: unable to do without hands, had to use the arm rests and could only complete 3 due to pain and fatigue took 32 seconds to do 3 of the 5 Timed up and go (TUG): 32 seconds with FWW.  02/11/24 20 seconds with SPC 3 minute walk test: 200 feet with the FWW  GAIT: Distance walked: 200 feet Assistive device utilized: Walker - 2 wheeled Level of assistance: SBA Comments: slow, left toe slightly turns in                                                                                                                                TREATMENT DATE:  05/12/24 Nustep level 5 x 6 minutes 35# HS curls Ball b/n knees squeeze 3# marches 3# abduction 10# straight arm pulls Blue tband clamshells Feet on ball K2C, rotation, bridge, isometric abs Gait 300 feet  05/05/24 Feet on ball K2C, rotation, small bridge, isometric abs SAQ bilaterally Ball b/n knees squeeze Blue tband clamshells Nustep level 5 x 6 minutes TUG 12 seconds 3 minute walk 400 feet STM to the right low back   04/26/24 Nustep level 5 x 6 minutes 35# leg curls 5# leg extension 50# resisted gait 3# marches 3# hip ext 3# hip abduction Ball b/n knees squeeze Blue tband clamshells On airex cone toe touch Side step on and off airex On airex ball toss and bounce  04/19/24 Nustep level 5 x 6 minutes 35# leg curls 5# leg extension 3# marches 3# abduction Ball b/n knees Sit to stand with weighted ball Feet on ball K2C, rotation, small bridge, isometric abs Supine marches  04/14/24 Nustep level 5 x 6 minutes TUG10 seconds  5XSTS 23 seconds Sit to stand with 10# weight Ball kicks Leg curls 35# 5# leg extension On airex 10# straight arm  pulls 5# Marches fatigue in the left thigh with this increased weight 5# hip abduction Ball b/n knees squeeze Fast walk 3 laps  More square 4 blocks side to side only with CGA  04/12/24 Leg curls 35# 3x10 Leg extension 5# 2.5# marches 2.5# hip abduction Ball squeeze Nustep level 6 x 6 minutes On airex 10# straight arm pulls More square 2blocks high On airex ball toss and bounce Fast walk 2 laps  04/07/24 Nustep level 5 x 7 minutes Leg curls 35# Leg ext 5# 5# marches, hip abduction and extension in Pbars Sit to stand with weight Side stepping Fast walking 4 laps Back to wall back to wall ball OHP working on posture  03/24/24 Leg curls 35# 3x10 Leg extension 5# 3x10 Gait outside long walk all the way around the back building, with a seated rest break at a picnic table, walking up  the hill had to use the hand rail on the side walk, standing rest break toward the top, then another standing rest break before we made it back to the building Discussion on building strength and endurance as well as being safe, using the SPC on long walks or when outside , not using the cane when feeling good and when inside  PATIENT EDUCATION:  Education details: POC/Reviewed HEP Person educated: Patient Education method: Explanation Education comprehension: verbalized understanding  HOME EXERCISE PROGRAM: From hospital   ASSESSMENT:  CLINICAL IMPRESSION: Patient had a recent fall the day after Christmas, ankle stepped off driveway and he fell, has some increased pain in the back, right knee and left hip, all x-rays were negative but this is a set back as he really struggles to get up from sitting.  He is still hurting in the back and moving slower, but is better than last week after the fall, still left hip and knee pain with the back pain  Patient is a 89 y.o. male who was seen today for physical therapy evaluation and treatment for s/p left THA posterior approach.   He was having significant  pain prior to the surgery so he had been limited with his ability.  He reports that for the most part he is without pain and only takes Tylenol .  He really struggles to get up from sitting, could not complete the 5XSTS test due to difficulty and pain.    OBJECTIVE IMPAIRMENTS: Abnormal gait, cardiopulmonary status limiting activity, decreased activity tolerance, decreased balance, decreased endurance, decreased mobility, difficulty walking, decreased ROM, decreased strength, increased edema, increased muscle spasms, impaired flexibility, postural dysfunction, and pain.   ACTIVITY LIMITATIONS: carrying, lifting, sitting, standing, squatting, sleeping, stairs, transfers, bed mobility, bathing, and locomotion level  PARTICIPATION LIMITATIONS: cleaning, laundry, driving, shopping, community activity, and yard work  PERSONAL FACTORS: Age, Fitness, Past/current experiences, 1 comorbidity:  , and 3+ comorbidities: as above are also affecting patient's functional outcome.   REHAB POTENTIAL: Good  CLINICAL DECISION MAKING: Evolving/moderate complexity  EVALUATION COMPLEXITY: Moderate   GOALS: Goals reviewed with patient? Yes  SHORT TERM GOALS: Target date: 02/25/24 Independent with initial HEP Baseline: Goal status:met 02/11/24   LONG TERM GOALS: Target date: 04/26/24  Independent with advanced HEP Baseline:  Goal status: progressing 05/05/24  2.  Go up and down stairs step over step Baseline:  Goal status: progressing 05/05/24  3.  Get up from sitting without hands Baseline:  Goal status: progressing 03/10/24 still struggling 04/14/24 really struggled today due to recent fall 05/05/24  4.  Decrease TUG time to 15 seconds Baseline: 32 seconds Goal status: met 04/01/24  04/14/24 able to do in 10 secondsmet 05/05/24  5.  Walk without device or SPC for all distances Baseline:  Goal status: met 02/25/24  6.  Walk 400 feet for Baseline:  Goal status: progressing 04/14/24, met  05/05/24   PLAN:  PT FREQUENCY: 2x/week  PT DURATION: 12 weeks  PLANNED INTERVENTIONS: 97164- PT Re-evaluation, 97110-Therapeutic exercises, 97530- Therapeutic activity, 97112- Neuromuscular re-education, 97535- Self Care, [id number]- Manual therapy, [phone]- Gait training, Patient/Family education, Balance training, Stair training, and Cryotherapy  PLAN FOR NEXT SESSION: advance walking, endurance, strength and function, see if we can get him to recover from this fall  Hilery Wintle W, PT 05/12/2024, 4:18 PM  "

## 2024-05-13 ENCOUNTER — Encounter (INDEPENDENT_AMBULATORY_CARE_PROVIDER_SITE_OTHER): Admitting: Ophthalmology

## 2024-05-13 DIAGNOSIS — H353112 Nonexudative age-related macular degeneration, right eye, intermediate dry stage: Secondary | ICD-10-CM

## 2024-05-13 DIAGNOSIS — H43813 Vitreous degeneration, bilateral: Secondary | ICD-10-CM | POA: Diagnosis not present

## 2024-05-13 DIAGNOSIS — H35033 Hypertensive retinopathy, bilateral: Secondary | ICD-10-CM

## 2024-05-13 DIAGNOSIS — H353221 Exudative age-related macular degeneration, left eye, with active choroidal neovascularization: Secondary | ICD-10-CM

## 2024-05-13 DIAGNOSIS — I10 Essential (primary) hypertension: Secondary | ICD-10-CM

## 2024-05-17 ENCOUNTER — Encounter: Payer: Self-pay | Admitting: Physical Therapy

## 2024-05-17 ENCOUNTER — Ambulatory Visit: Admitting: Physical Therapy

## 2024-05-17 DIAGNOSIS — M25552 Pain in left hip: Secondary | ICD-10-CM

## 2024-05-17 DIAGNOSIS — M6281 Muscle weakness (generalized): Secondary | ICD-10-CM

## 2024-05-17 DIAGNOSIS — R2681 Unsteadiness on feet: Secondary | ICD-10-CM

## 2024-05-17 DIAGNOSIS — R262 Difficulty in walking, not elsewhere classified: Secondary | ICD-10-CM

## 2024-05-17 NOTE — Therapy (Signed)
 " OUTPATIENT PHYSICAL THERAPY LOWER EXTREMITY TREATMENT     Patient Name: Connor [name] MRN: [id number] DOB:1933/10/20, 89 y.o., male Today's Date: 05/17/2024  END OF SESSION:  PT End of Session - 05/17/24 1440     Visit Number 26    Date for Recertification  08/18/24    Authorization Type [employer] 2/12    PT Start Time 1440    PT Stop Time 1523    PT Time Calculation (min) 43 min    Activity Tolerance Patient tolerated treatment well    Behavior During Therapy [facility] for tasks assessed/performed          Past Medical History:  Diagnosis Date   Allergy 1985   Anemia    Benign localized prostatic hyperplasia with lower urinary tract symptoms (LUTS)    urologist-- dr matilda   Cancer [facility]) 07/2018   prostate   Cataract 12/2012   CHF (congestive heart failure) (HCC) ?   Chronic anticoagulation    Xarelto  for afib   Chronic constipation    Chronic cystitis    Chronic kidney disease    Clotting disorder    GERD (gastroesophageal reflux disease)    Gross hematuria    Heart murmur ?   History of DVT of lower extremity 06/08/2010   right femoral dvt dx post op total hip 05-08-2010   History of gastric ulcer    remote   History of kidney stones    one stone. remote hx   Hypercholesteremia    Hyperlipidemia ?   Hypertension    Dx by Dr. Salena Skill around age  37    Macular degeneration    dry in both eyes,  no injections  sees Dr. Alvia   NICM (nonischemic cardiomyopathy) [facility])    per echo 06-11-2017,  ef 45-50% with diffuse hypokinesis,  G1DD   OSA on CPAP 2017   compliant with CPAP.  managed by Dr Jude.   Osteoarthritis    Other urethral stricture, male, meatal    chronic;  pt self dilates   Paroxysmal atrial fibrillation [facility]) cardiologist-- dr kelsie   first dx 2009/   a. documented by Dr Comer on office EKG 9/12. b. maintained on Tikosyn  and Xarelto . c. s/p DCCV 's.  d.  x3  EP w/ ablation atrial fib last one 12-05-2016   Pre-diabetes    Premature  ventricular contraction    Presence of Watchman left atrial appendage closure device 05/30/2022   Watchman FLX 24mm by Dr. Cindie   PVC's (premature ventricular contractions)    RA (rheumatoid arthritis) [facility])    rheumatologist--- Dr LORELEI Jacob,  treated with Humira   RBBB (right bundle branch block)    Hx of a fib, ablation Aug 2018   PVCs,   Pt had Watchman placed 05-30-2022   Rheumatoid arthritis (HCC)    Sleep apnea    Ulcer    Vitamin D  deficiency    Wears hearing aid in both ears    Past Surgical History:  Procedure Laterality Date   ABLATION OF DYSRHYTHMIC FOCUS  08/29/2015   ATRIAL FIBRILLATION ABLATION  12/05/2016   ATRIAL FIBRILLATION ABLATION N/A 12/05/2016   Procedure: Atrial Fibrillation Ablation;  Surgeon: Kelsie Lynwood, MD;  Location: [facility] INVASIVE CV LAB;  Service: Cardiovascular;  Laterality: N/A;   ATRIAL FIBRILLATION ABLATION N/A 02/29/2020   Procedure: ATRIAL FIBRILLATION ABLATION;  Surgeon: Kelsie Lynwood, MD;  Location: [facility] INVASIVE CV LAB;  Service: Cardiovascular;  Laterality: N/A;   ATRIAL FLUTTER ABLATION  09/2010   by MILUS EDWARDS Right 1990s   CARDIOVERSION N/A 10/28/2012   Procedure: CARDIOVERSION;  Surgeon: Lonni JONETTA Cash, MD;  Location: [facility] OR;  Service: Cardiovascular;  Laterality: N/A;   CATARACT EXTRACTION W/ INTRAOCULAR LENS  IMPLANT, BILATERAL  2019   CYSTOSCOPY WITH BIOPSY N/A 09/03/2018   Procedure: CYSTOSCOPY WITH FULGURATION CELESTA;  Surgeon: Matilda Senior, MD;  Location: [facility] ORS;  Service: Urology;  Laterality: N/A;  30 MINS   CYSTOSCOPY WITH FULGERATION N/A 01/23/2022   Procedure: CYSTOSCOPY WITH FULGERATION OF PROSTATIC URETHRA;  Surgeon: Matilda Senior, MD;  Location: [facility] ORS;  Service: Urology;  Laterality: N/A;  30 MINS   DENTAL SURGERY     ELECTROPHYSIOLOGIC STUDY N/A 08/29/2015   Procedure: Atrial Fibrillation Ablation;  Surgeon: Lynwood Rakers, MD;  Location: [facility] INVASIVE CV LAB;  Service: Cardiovascular;  Laterality:  N/A;   EYE SURGERY     Cataracts   JOINT REPLACEMENT  1995, 1997   KNEE SURGERY Bilateral x3   1960s & 1970s   open   LEFT ATRIAL APPENDAGE OCCLUSION N/A 05/30/2022   Procedure: LEFT ATRIAL APPENDAGE OCCLUSION;  Surgeon: Cindie Ole DASEN, MD;  Location: [facility] INVASIVE CV LAB;  Service: Cardiovascular;  Laterality: N/A;   LEFT HEART CATHETERIZATION WITH CORONARY ANGIOGRAM N/A 06/07/2011   Procedure: LEFT HEART CATHETERIZATION WITH CORONARY ANGIOGRAM;  Surgeon: Peter M Jordan, MD;  Location: [facility] CATH LAB;  Service: Cardiovascular;  Laterality: N/A;    Normal coronary arteries,  low normal LVF   TEE WITHOUT CARDIOVERSION N/A 05/30/2022   Procedure: TRANSESOPHAGEAL ECHOCARDIOGRAM (TEE);  Surgeon: Cindie Ole DASEN, MD;  Location: [facility] INVASIVE CV LAB;  Service: Cardiovascular;  Laterality: N/A;   TONSILLECTOMY AND ADENOIDECTOMY  age 56   TOTAL HIP ARTHROPLASTY Right 06/04/10    dr jane  @[facility]    TOTAL HIP ARTHROPLASTY Left 01/07/2024   Procedure: ARTHROPLASTY, HIP, TOTAL,POSTERIOR APPROACH;  Surgeon: Edna Toribio LABOR, MD;  Location: [facility] ORS;  Service: Orthopedics;  Laterality: Left;   TOTAL KNEE ARTHROPLASTY Bilateral right 1995;  left 1997   TRANSURETHRAL RESECTION OF PROSTATE  06-29-2002   dr humphries  @[facility]    TRANSURETHRAL RESECTION OF PROSTATE N/A 07/06/2018   Procedure: TRANSURETHRAL RESECTION OF THE PROSTATE (TURP);  Surgeon: Matilda Senior, MD;  Location: [facility];  Service: Urology;  Laterality: N/A;  45 MINS   Patient Active Problem List   Diagnosis Date Noted   Primary osteoarthritis of left hip 01/07/2024   Pain due to onychomycosis of toenails of both feet 07/03/2023   Atrial fibrillation (HCC) 05/30/2022   Presence of Watchman left atrial appendage closure device 05/30/2022   Hematuria 01/04/2022   Gross hematuria 01/04/2022   Secondary hypercoagulable state 03/28/2020   Malignant neoplasm of prostate (HCC) 09/11/2018   Enlarged prostate with urinary  obstruction 07/06/2018   Gastrocnemius strain, right, initial encounter 12/10/2017   Baker's cyst of knee, right 12/10/2017   Obesity 03/24/2017   Paroxysmal atrial fibrillation (HCC) 12/05/2016   Near syncope 04/20/2016   Leg hematoma, left, initial encounter 04/20/2016   Bleeding    Ecchymosis    Left hamstring muscle strain    Muscle tear    RBBB 08/30/2015   Chronic systolic dysfunction of left ventricle 07/11/2014   OSA (obstructive sleep apnea) 04/07/2013   Multinodular goiter 01/20/2013   Cervical spondylosis without myelopathy 01/18/2013   Left ventricular dysfunction 12/17/2011   Low back pain 09/11/2011   Fatigue 05/22/2011   PVC (premature ventricular contraction) 01/18/2011  Persistent atrial fibrillation (HCC) 01/12/2011   Hyperlipidemia 08/02/2010   Hypertension 08/02/2010   Osteoarthritis 08/02/2010   BPH (benign prostatic hyperplasia) 08/02/2010   GERD (gastroesophageal reflux disease) 08/02/2010   h/o Atrial flutter (HCC) s/p ablation 2012     PCP: Copland, MD  REFERRING PROVIDER: Edna, MD  REFERRING DIAG: S/P Left THA posterior  THERAPY DIAG:  Pain in left hip  Muscle weakness (generalized)  Unsteadiness on feet  Difficulty in walking, not elsewhere classified  Rationale for Evaluation and Treatment: Rehabilitation  ONSET DATE: 01/07/24  SUBJECTIVE:   SUBJECTIVE STATEMENT: Reports that the back is still hurting still sore in the right ribs, he reports that he is very fatigued when he tries to do a lot  Patient had a fall the day after Christmas, reports that he was on the driveway and he thinks the foot rolled when he was half on the driveway and fell into the yard, reports bad back pain, also right knee pain, and left hip strain, he had x-rays of back, ribs, knees and hips all negative.  Reports back pain a 9.5/10 when getting up and down  Patient underwent left posterior THA on 01/07/24, he has had Home PT until last week.  He reports that  he has very little pain, just fatigue.  He reports that he has been doing the HEP given from the hospital.    PERTINENT HISTORY: A fib, prostate CA, LBP. HLD, HTN, OA, RA, bilateral TKA PAIN:  Are you having pain? Yes: NPRS scale: 0/10 Pain location: left hip/back/thigh Pain description: spasm Aggravating factors: getting up from the chair pain up to 5/10 Relieving factors: rest, Tylenol , can have no pain  PRECAUTIONS: Posterior hip  RED FLAGS: None   WEIGHT BEARING RESTRICTIONS: No  FALLS:  Has patient fallen in last 6 months? No  LIVING ENVIRONMENT: Lives with: lives with their family and lives with their spouse Lives in: House/apartment Stairs: Yes: Internal: 14 steps; can reach both Has following equipment at home: Vannie - 2 wheeled  OCCUPATION: retired  PLOF: Independent and shopping, no device, some yard work  PATIENT GOALS: I want to walk without any device, I want to be able to do yard work  NEXT MD VISIT: October 16  OBJECTIVE:  Note: Objective measures were completed at Evaluation unless otherwise noted.  DIAGNOSTIC FINDINGS: N/A   COGNITION: Overall cognitive status: Within functional limits for tasks assessed     SENSATION: WFL  EDEMA:  Has visible edema in the left thigh and knee  MUSCLE LENGTH: Tight calves and HS  POSTURE: rounded shoulders, forward head, and decreased lumbar lordosis  PALPATION: Mild tenderness in the left thigh and hip, buttock  LOWER EXTREMITY ROM:  Active ROM Right eval Left eval  Hip flexion  45  Hip extension    Hip abduction  10  Hip adduction    Hip internal rotation    Hip external rotation    Knee flexion    Knee extension    Ankle dorsiflexion    Ankle plantarflexion    Ankle inversion    Ankle eversion     (Blank rows = not tested)  LOWER EXTREMITY MMT:  MMT Right eval Left eval  Hip flexion  3+  Hip extension    Hip abduction  3+  Hip adduction    Hip internal rotation    Hip external  rotation    Knee flexion  4-  Knee extension  4-  Ankle dorsiflexion    Ankle  plantarflexion    Ankle inversion    Ankle eversion     (Blank rows = not tested)  FUNCTIONAL TESTS:  5 times sit to stand: unable to do without hands, had to use the arm rests and could only complete 3 due to pain and fatigue took 32 seconds to do 3 of the 5 Timed up and go (TUG): 32 seconds with FWW.  02/11/24 20 seconds with SPC 3 minute walk test: 200 feet with the FWW  GAIT: Distance walked: 200 feet Assistive device utilized: Walker - 2 wheeled Level of assistance: SBA Comments: slow, left toe slightly turns in                                                                                                                                TREATMENT DATE:  05/17/24 Nustep level 5 x 6 minutes 45# HS curls 5# leg ext 10# standing straight arm pulls On airex reaching On airex cone toe touches 4 block more square step overs 3# seated marches 3# standing hip abduction 3# standing hip extension Gait outside walking from the back door to the car CGA due to fatigue and a LOB  05/12/24 Nustep level 5 x 6 minutes 35# HS curls Ball b/n knees squeeze 3# marches 3# abduction 10# straight arm pulls Blue tband clamshells Feet on ball K2C, rotation, bridge, isometric abs Gait 300 feet  05/05/24 Feet on ball K2C, rotation, small bridge, isometric abs SAQ bilaterally Ball b/n knees squeeze Blue tband clamshells Nustep level 5 x 6 minutes TUG 12 seconds 3 minute walk 400 feet STM to the right low back   04/26/24 Nustep level 5 x 6 minutes 35# leg curls 5# leg extension 50# resisted gait 3# marches 3# hip ext 3# hip abduction Ball b/n knees squeeze Blue tband clamshells On airex cone toe touch Side step on and off airex On airex ball toss and bounce  04/19/24 Nustep level 5 x 6 minutes 35# leg curls 5# leg extension 3# marches 3# abduction Ball b/n knees Sit to stand with weighted  ball Feet on ball K2C, rotation, small bridge, isometric abs Supine marches  04/14/24 Nustep level 5 x 6 minutes TUG10 seconds  5XSTS 23 seconds Sit to stand with 10# weight Ball kicks Leg curls 35# 5# leg extension On airex 10# straight arm pulls 5# Marches fatigue in the left thigh with this increased weight 5# hip abduction Ball b/n knees squeeze Fast walk 3 laps  More square 4 blocks side to side only with CGA  04/12/24 Leg curls 35# 3x10 Leg extension 5# 2.5# marches 2.5# hip abduction Ball squeeze Nustep level 6 x 6 minutes On airex 10# straight arm pulls More square 2blocks high On airex ball toss and bounce Fast walk 2 laps  PATIENT EDUCATION:  Education details: POC/Reviewed HEP Person educated: Patient Education method: Explanation Education comprehension: verbalized understanding  HOME EXERCISE PROGRAM: From hospital   ASSESSMENT:  CLINICAL IMPRESSION: 05/17/24 Working on gait again, he was much more fatigued and had some difficulty with walking outside, required CGA and had one LOB.  Difficulty marching with the left side  05/12/24  Patient had a recent fall the day after Christmas, ankle stepped off driveway and he fell, has some increased pain in the back, right knee and left hip, all x-rays were negative but this is a set back as he really struggles to get up from sitting.  He is still hurting in the back and moving slower, but is better than last week after the fall, still left hip and knee pain with the back pain  Patient is a 89 y.o. male who was seen today for physical therapy evaluation and treatment for s/p left THA posterior approach.   He was having significant pain prior to the surgery so he had been limited with his ability.  He reports that for the most part he is without pain and only takes Tylenol .  He really struggles to get up from sitting, could not complete the 5XSTS test due to difficulty and pain.    OBJECTIVE IMPAIRMENTS: Abnormal gait,  cardiopulmonary status limiting activity, decreased activity tolerance, decreased balance, decreased endurance, decreased mobility, difficulty walking, decreased ROM, decreased strength, increased edema, increased muscle spasms, impaired flexibility, postural dysfunction, and pain.   ACTIVITY LIMITATIONS: carrying, lifting, sitting, standing, squatting, sleeping, stairs, transfers, bed mobility, bathing, and locomotion level  PARTICIPATION LIMITATIONS: cleaning, laundry, driving, shopping, community activity, and yard work  PERSONAL FACTORS: Age, Fitness, Past/current experiences, 1 comorbidity:  , and 3+ comorbidities: as above are also affecting patient's functional outcome.   REHAB POTENTIAL: Good  CLINICAL DECISION MAKING: Evolving/moderate complexity  EVALUATION COMPLEXITY: Moderate   GOALS: Goals reviewed with patient? Yes  SHORT TERM GOALS: Target date: 02/25/24 Independent with initial HEP Baseline: Goal status:met 02/11/24   LONG TERM GOALS: Target date: 04/26/24  Independent with advanced HEP Baseline:  Goal status: progressing 05/17/24  2.  Go up and down stairs step over step Baseline:  Goal status: progressing 05/17/24  3.  Get up from sitting without hands Baseline:  Goal status: progressing 03/10/24 still struggling 04/14/24 really struggled today due to recent fall 05/05/24  4.  Decrease TUG time to 15 seconds Baseline: 32 seconds Goal status: met 04/01/24  04/14/24 able to do in 10 secondsmet 05/05/24  5.  Walk without device or SPC for all distances Baseline:  Goal status: met 02/25/24  6.  Walk 400 feet for Baseline:  Goal status: progressing 04/14/24, met 05/05/24   PLAN:  PT FREQUENCY: 2x/week  PT DURATION: 12 weeks  PLANNED INTERVENTIONS: 97164- PT Re-evaluation, 97110-Therapeutic exercises, 97530- Therapeutic activity, 97112- Neuromuscular re-education, 97535- Self Care, [id number]- Manual therapy, [phone]- Gait training, Patient/Family  education, Balance training, Stair training, and Cryotherapy  PLAN FOR NEXT SESSION: advance walking, endurance, strength and function, see if we can get him to recover from this fall  Renessa Wellnitz W, PT 05/17/2024, 2:42 PM  "

## 2024-05-24 ENCOUNTER — Encounter: Payer: Self-pay | Admitting: Physical Therapy

## 2024-05-24 ENCOUNTER — Ambulatory Visit: Admitting: Physical Therapy

## 2024-05-24 DIAGNOSIS — M6281 Muscle weakness (generalized): Secondary | ICD-10-CM

## 2024-05-24 DIAGNOSIS — M25552 Pain in left hip: Secondary | ICD-10-CM

## 2024-05-24 DIAGNOSIS — R2689 Other abnormalities of gait and mobility: Secondary | ICD-10-CM

## 2024-05-24 DIAGNOSIS — R252 Cramp and spasm: Secondary | ICD-10-CM

## 2024-05-24 DIAGNOSIS — R262 Difficulty in walking, not elsewhere classified: Secondary | ICD-10-CM

## 2024-05-24 DIAGNOSIS — R2681 Unsteadiness on feet: Secondary | ICD-10-CM

## 2024-05-24 NOTE — Therapy (Signed)
 " OUTPATIENT PHYSICAL THERAPY LOWER EXTREMITY TREATMENT     Patient Name: Connor [name] MRN: [id number] DOB:11/23/1933, 89 y.o., male Today's Date: 05/24/2024  END OF SESSION:  PT End of Session - 05/24/24 1051     Visit Number 27    Date for Recertification  08/18/24    Authorization Type [employer] 3/12    PT Start Time 1051    PT Stop Time 1143    PT Time Calculation (min) 52 min    Activity Tolerance Patient tolerated treatment well    Behavior During Therapy [facility] for tasks assessed/performed          Past Medical History:  Diagnosis Date   Allergy 1985   Anemia    Benign localized prostatic hyperplasia with lower urinary tract symptoms (LUTS)    urologist-- dr matilda   Cancer [facility]) 07/2018   prostate   Cataract 12/2012   CHF (congestive heart failure) (HCC) ?   Chronic anticoagulation    Xarelto  for afib   Chronic constipation    Chronic cystitis    Chronic kidney disease    Clotting disorder    GERD (gastroesophageal reflux disease)    Gross hematuria    Heart murmur ?   History of DVT of lower extremity 06/08/2010   right femoral dvt dx post op total hip 05-08-2010   History of gastric ulcer    remote   History of kidney stones    one stone. remote hx   Hypercholesteremia    Hyperlipidemia ?   Hypertension    Dx by Dr. Salena Skill around age  43    Macular degeneration    dry in both eyes,  no injections  sees Dr. Alvia   NICM (nonischemic cardiomyopathy) [facility])    per echo 06-11-2017,  ef 45-50% with diffuse hypokinesis,  G1DD   OSA on CPAP 2017   compliant with CPAP.  managed by Dr Jude.   Osteoarthritis    Other urethral stricture, male, meatal    chronic;  pt self dilates   Paroxysmal atrial fibrillation [facility]) cardiologist-- dr kelsie   first dx 2009/   a. documented by Dr Comer on office EKG 9/12. b. maintained on Tikosyn  and Xarelto . c. s/p DCCV 's.  d.  x3  EP w/ ablation atrial fib last one 12-05-2016   Pre-diabetes    Premature  ventricular contraction    Presence of Watchman left atrial appendage closure device 05/30/2022   Watchman FLX 24mm by Dr. Cindie   PVC's (premature ventricular contractions)    RA (rheumatoid arthritis) [facility])    rheumatologist--- Dr LORELEI Jacob,  treated with Humira   RBBB (right bundle branch block)    Hx of a fib, ablation Aug 2018   PVCs,   Pt had Watchman placed 05-30-2022   Rheumatoid arthritis (HCC)    Sleep apnea    Ulcer    Vitamin D  deficiency    Wears hearing aid in both ears    Past Surgical History:  Procedure Laterality Date   ABLATION OF DYSRHYTHMIC FOCUS  08/29/2015   ATRIAL FIBRILLATION ABLATION  12/05/2016   ATRIAL FIBRILLATION ABLATION N/A 12/05/2016   Procedure: Atrial Fibrillation Ablation;  Surgeon: Kelsie Lynwood, MD;  Location: [facility] INVASIVE CV LAB;  Service: Cardiovascular;  Laterality: N/A;   ATRIAL FIBRILLATION ABLATION N/A 02/29/2020   Procedure: ATRIAL FIBRILLATION ABLATION;  Surgeon: Kelsie Lynwood, MD;  Location: [facility] INVASIVE CV LAB;  Service: Cardiovascular;  Laterality: N/A;   ATRIAL FLUTTER ABLATION  09/2010   by MILUS EDWARDS Right 1990s   CARDIOVERSION N/A 10/28/2012   Procedure: CARDIOVERSION;  Surgeon: Lonni JONETTA Cash, MD;  Location: [facility] OR;  Service: Cardiovascular;  Laterality: N/A;   CATARACT EXTRACTION W/ INTRAOCULAR LENS  IMPLANT, BILATERAL  2019   CYSTOSCOPY WITH BIOPSY N/A 09/03/2018   Procedure: CYSTOSCOPY WITH FULGURATION CELESTA;  Surgeon: Matilda Senior, MD;  Location: [facility] ORS;  Service: Urology;  Laterality: N/A;  30 MINS   CYSTOSCOPY WITH FULGERATION N/A 01/23/2022   Procedure: CYSTOSCOPY WITH FULGERATION OF PROSTATIC URETHRA;  Surgeon: Matilda Senior, MD;  Location: [facility] ORS;  Service: Urology;  Laterality: N/A;  30 MINS   DENTAL SURGERY     ELECTROPHYSIOLOGIC STUDY N/A 08/29/2015   Procedure: Atrial Fibrillation Ablation;  Surgeon: Lynwood Rakers, MD;  Location: [facility] INVASIVE CV LAB;  Service: Cardiovascular;  Laterality:  N/A;   EYE SURGERY     Cataracts   JOINT REPLACEMENT  1995, 1997   KNEE SURGERY Bilateral x3   1960s & 1970s   open   LEFT ATRIAL APPENDAGE OCCLUSION N/A 05/30/2022   Procedure: LEFT ATRIAL APPENDAGE OCCLUSION;  Surgeon: Cindie Ole DASEN, MD;  Location: [facility] INVASIVE CV LAB;  Service: Cardiovascular;  Laterality: N/A;   LEFT HEART CATHETERIZATION WITH CORONARY ANGIOGRAM N/A 06/07/2011   Procedure: LEFT HEART CATHETERIZATION WITH CORONARY ANGIOGRAM;  Surgeon: Peter M Jordan, MD;  Location: [facility] CATH LAB;  Service: Cardiovascular;  Laterality: N/A;    Normal coronary arteries,  low normal LVF   TEE WITHOUT CARDIOVERSION N/A 05/30/2022   Procedure: TRANSESOPHAGEAL ECHOCARDIOGRAM (TEE);  Surgeon: Cindie Ole DASEN, MD;  Location: [facility] INVASIVE CV LAB;  Service: Cardiovascular;  Laterality: N/A;   TONSILLECTOMY AND ADENOIDECTOMY  age 34   TOTAL HIP ARTHROPLASTY Right 06/04/10    dr jane  @[facility]    TOTAL HIP ARTHROPLASTY Left 01/07/2024   Procedure: ARTHROPLASTY, HIP, TOTAL,POSTERIOR APPROACH;  Surgeon: Edna Toribio LABOR, MD;  Location: [facility] ORS;  Service: Orthopedics;  Laterality: Left;   TOTAL KNEE ARTHROPLASTY Bilateral right 1995;  left 1997   TRANSURETHRAL RESECTION OF PROSTATE  06-29-2002   dr humphries  @[facility]    TRANSURETHRAL RESECTION OF PROSTATE N/A 07/06/2018   Procedure: TRANSURETHRAL RESECTION OF THE PROSTATE (TURP);  Surgeon: Matilda Senior, MD;  Location: [facility];  Service: Urology;  Laterality: N/A;  45 MINS   Patient Active Problem List   Diagnosis Date Noted   Primary osteoarthritis of left hip 01/07/2024   Pain due to onychomycosis of toenails of both feet 07/03/2023   Atrial fibrillation (HCC) 05/30/2022   Presence of Watchman left atrial appendage closure device 05/30/2022   Hematuria 01/04/2022   Gross hematuria 01/04/2022   Secondary hypercoagulable state 03/28/2020   Malignant neoplasm of prostate (HCC) 09/11/2018   Enlarged prostate with urinary  obstruction 07/06/2018   Gastrocnemius strain, right, initial encounter 12/10/2017   Baker's cyst of knee, right 12/10/2017   Obesity 03/24/2017   Paroxysmal atrial fibrillation (HCC) 12/05/2016   Near syncope 04/20/2016   Leg hematoma, left, initial encounter 04/20/2016   Bleeding    Ecchymosis    Left hamstring muscle strain    Muscle tear    RBBB 08/30/2015   Chronic systolic dysfunction of left ventricle 07/11/2014   OSA (obstructive sleep apnea) 04/07/2013   Multinodular goiter 01/20/2013   Cervical spondylosis without myelopathy 01/18/2013   Left ventricular dysfunction 12/17/2011   Low back pain 09/11/2011   Fatigue 05/22/2011   PVC (premature ventricular contraction) 01/18/2011  Persistent atrial fibrillation (HCC) 01/12/2011   Hyperlipidemia 08/02/2010   Hypertension 08/02/2010   Osteoarthritis 08/02/2010   BPH (benign prostatic hyperplasia) 08/02/2010   GERD (gastroesophageal reflux disease) 08/02/2010   h/o Atrial flutter (HCC) s/p ablation 2012     PCP: Copland, MD  REFERRING PROVIDER: Edna, MD  REFERRING DIAG: S/P Left THA posterior  THERAPY DIAG:  Pain in left hip  Muscle weakness (generalized)  Unsteadiness on feet  Difficulty in walking, not elsewhere classified  Other abnormalities of gait and mobility  Cramp and spasm  Rationale for Evaluation and Treatment: Rehabilitation  ONSET DATE: 01/07/24  SUBJECTIVE:   SUBJECTIVE STATEMENT: Reports still some pain in the right rib area, reports that he realizes he has to do more at home and is trying to do a few new things recently that sound appropriate  Patient had a fall the day after Christmas, reports that he was on the driveway and he thinks the foot rolled when he was half on the driveway and fell into the yard, reports bad back pain, also right knee pain, and left hip strain, he had x-rays of back, ribs, knees and hips all negative.  Reports back pain a 9.5/10 when getting up and  down  Patient underwent left posterior THA on 01/07/24, he has had Home PT until last week.  He reports that he has very little pain, just fatigue.  He reports that he has been doing the HEP given from the hospital.    PERTINENT HISTORY: A fib, prostate CA, LBP. HLD, HTN, OA, RA, bilateral TKA PAIN:  Are you having pain? Yes: NPRS scale: 0/10 Pain location: left hip/back/thigh Pain description: spasm Aggravating factors: getting up from the chair pain up to 5/10 Relieving factors: rest, Tylenol , can have no pain  PRECAUTIONS: Posterior hip  RED FLAGS: None   WEIGHT BEARING RESTRICTIONS: No  FALLS:  Has patient fallen in last 6 months? No  LIVING ENVIRONMENT: Lives with: lives with their family and lives with their spouse Lives in: House/apartment Stairs: Yes: Internal: 14 steps; can reach both Has following equipment at home: Vannie - 2 wheeled  OCCUPATION: retired  PLOF: Independent and shopping, no device, some yard work  PATIENT GOALS: I want to walk without any device, I want to be able to do yard work  NEXT MD VISIT: October 16  OBJECTIVE:  Note: Objective measures were completed at Evaluation unless otherwise noted.  DIAGNOSTIC FINDINGS: N/A   COGNITION: Overall cognitive status: Within functional limits for tasks assessed     SENSATION: WFL  EDEMA:  Has visible edema in the left thigh and knee  MUSCLE LENGTH: Tight calves and HS  POSTURE: rounded shoulders, forward head, and decreased lumbar lordosis  PALPATION: Mild tenderness in the left thigh and hip, buttock  LOWER EXTREMITY ROM:  Active ROM Right eval Left eval  Hip flexion  45  Hip extension    Hip abduction  10  Hip adduction    Hip internal rotation    Hip external rotation    Knee flexion    Knee extension    Ankle dorsiflexion    Ankle plantarflexion    Ankle inversion    Ankle eversion     (Blank rows = not tested)  LOWER EXTREMITY MMT:  MMT Right eval Left eval  Hip  flexion  3+  Hip extension    Hip abduction  3+  Hip adduction    Hip internal rotation    Hip external rotation  Knee flexion  4-  Knee extension  4-  Ankle dorsiflexion    Ankle plantarflexion    Ankle inversion    Ankle eversion     (Blank rows = not tested)  FUNCTIONAL TESTS:  5 times sit to stand: unable to do without hands, had to use the arm rests and could only complete 3 due to pain and fatigue took 32 seconds to do 3 of the 5 Timed up and go (TUG): 32 seconds with FWW.  02/11/24 20 seconds with SPC 3 minute walk test: 200 feet with the FWW  GAIT: Distance walked: 200 feet Assistive device utilized: Walker - 2 wheeled Level of assistance: SBA Comments: slow, left toe slightly turns in                                                                                                                                TREATMENT DATE:  05/24/24 Leg curls 35# 3x10 Nustep level 5 x 7 minutes Standing 3# marches, hip abduction Seated 3# marches Ball b/n knees squeeze Ball in lap isometric abs On airex 10# straight arm pulls Walking ball toss 2 laps Standing cone toe touches Direction changes Gait 300 feet prior to rest  05/17/24 Nustep level 5 x 6 minutes 45# HS curls 5# leg ext 10# standing straight arm pulls On airex reaching On airex cone toe touches 4 block more square step overs 3# seated marches 3# standing hip abduction 3# standing hip extension Gait outside walking from the back door to the car CGA due to fatigue and a LOB  05/12/24 Nustep level 5 x 6 minutes 35# HS curls Ball b/n knees squeeze 3# marches 3# abduction 10# straight arm pulls Blue tband clamshells Feet on ball K2C, rotation, bridge, isometric abs Gait 300 feet  05/05/24 Feet on ball K2C, rotation, small bridge, isometric abs SAQ bilaterally Ball b/n knees squeeze Blue tband clamshells Nustep level 5 x 6 minutes TUG 12 seconds 3 minute walk 400 feet STM to the right low  back   04/26/24 Nustep level 5 x 6 minutes 35# leg curls 5# leg extension 50# resisted gait 3# marches 3# hip ext 3# hip abduction Ball b/n knees squeeze Blue tband clamshells On airex cone toe touch Side step on and off airex On airex ball toss and bounce  04/19/24 Nustep level 5 x 6 minutes 35# leg curls 5# leg extension 3# marches 3# abduction Ball b/n knees Sit to stand with weighted ball Feet on ball K2C, rotation, small bridge, isometric abs Supine marches  04/14/24 Nustep level 5 x 6 minutes TUG10 seconds  5XSTS 23 seconds Sit to stand with 10# weight Ball kicks Leg curls 35# 5# leg extension On airex 10# straight arm pulls 5# Marches fatigue in the left thigh with this increased weight 5# hip abduction Ball b/n knees squeeze Fast walk 3 laps  More square 4 blocks side to side only with CGA  04/12/24  Leg curls 35# 3x10 Leg extension 5# 2.5# marches 2.5# hip abduction Ball squeeze Nustep level 6 x 6 minutes On airex 10# straight arm pulls More square 2blocks high On airex ball toss and bounce Fast walk 2 laps  PATIENT EDUCATION:  Education details: POC/Reviewed HEP Person educated: Patient Education method: Explanation Education comprehension: verbalized understanding  HOME EXERCISE PROGRAM: From hospital   ASSESSMENT:  CLINICAL IMPRESSION: 05/24/24 Patient reports that he really still feels like he is recovering from the fall he took.  He gets fatigued easily and is having difficulty with lifting his leg on his own, tends to want to use hands to do hip flexion especially with him in sitting.  I had him in a chair wihtout arms and he really struggled today had to put a small cushion under him so he could get up without hands.  05/12/24  Patient had a recent fall the day after Christmas, ankle stepped off driveway and he fell, has some increased pain in the back, right knee and left hip, all x-rays were negative but this is a set back as he  really struggles to get up from sitting.  He is still hurting in the back and moving slower, but is better than last week after the fall, still left hip and knee pain with the back pain  Patient is a 89 y.o. male who was seen today for physical therapy evaluation and treatment for s/p left THA posterior approach.   He was having significant pain prior to the surgery so he had been limited with his ability.  He reports that for the most part he is without pain and only takes Tylenol .  He really struggles to get up from sitting, could not complete the 5XSTS test due to difficulty and pain.    OBJECTIVE IMPAIRMENTS: Abnormal gait, cardiopulmonary status limiting activity, decreased activity tolerance, decreased balance, decreased endurance, decreased mobility, difficulty walking, decreased ROM, decreased strength, increased edema, increased muscle spasms, impaired flexibility, postural dysfunction, and pain.   ACTIVITY LIMITATIONS: carrying, lifting, sitting, standing, squatting, sleeping, stairs, transfers, bed mobility, bathing, and locomotion level  PARTICIPATION LIMITATIONS: cleaning, laundry, driving, shopping, community activity, and yard work  PERSONAL FACTORS: Age, Fitness, Past/current experiences, 1 comorbidity:  , and 3+ comorbidities: as above are also affecting patient's functional outcome.   REHAB POTENTIAL: Good  CLINICAL DECISION MAKING: Evolving/moderate complexity  EVALUATION COMPLEXITY: Moderate   GOALS: Goals reviewed with patient? Yes  SHORT TERM GOALS: Target date: 02/25/24 Independent with initial HEP Baseline: Goal status:met 02/11/24   LONG TERM GOALS: Target date: 04/26/24  Independent with advanced HEP Baseline:  Goal status: progressing 05/17/24  2.  Go up and down stairs step over step Baseline:  Goal status: progressing 05/17/24  3.  Get up from sitting without hands Baseline:  Goal status: progressing 03/10/24 still struggling 04/14/24 really struggled  today due to recent fall 05/05/24, still working on this 05/24/24  4.  Decrease TUG time to 15 seconds Baseline: 32 seconds Goal status: met 04/01/24  04/14/24 able to do in 10 secondsmet 05/05/24  5.  Walk without device or SPC for all distances Baseline:  Goal status: met 02/25/24  6.  Walk 400 feet for Baseline:  Goal status: progressing 04/14/24, met 05/05/24   PLAN:  PT FREQUENCY: 2x/week  PT DURATION: 12 weeks  PLANNED INTERVENTIONS: 97164- PT Re-evaluation, 97110-Therapeutic exercises, 97530- Therapeutic activity, 97112- Neuromuscular re-education, 97535- Self Care, [id number]- Manual therapy, [phone]- Gait training, Patient/Family education, Balance training, Stair training,  and Cryotherapy  PLAN FOR NEXT SESSION: advance walking, endurance, strength and function, see if we can get him to recover from this fall  Briyana Badman W, PT 05/24/2024, 10:51 AM  "

## 2024-05-25 ENCOUNTER — Encounter: Payer: Self-pay | Admitting: Hematology & Oncology

## 2024-05-25 ENCOUNTER — Inpatient Hospital Stay: Payer: Medicare PPO | Admitting: Hematology & Oncology

## 2024-05-25 ENCOUNTER — Inpatient Hospital Stay: Payer: Medicare PPO | Attending: Family

## 2024-05-25 VITALS — BP 138/61 | HR 66 | Temp 97.9°F | Resp 20 | Ht 73.0 in | Wt 216.8 lb

## 2024-05-25 DIAGNOSIS — D696 Thrombocytopenia, unspecified: Secondary | ICD-10-CM | POA: Diagnosis not present

## 2024-05-25 DIAGNOSIS — D7589 Other specified diseases of blood and blood-forming organs: Secondary | ICD-10-CM

## 2024-05-25 LAB — CBC WITH DIFFERENTIAL (CANCER CENTER ONLY)
Abs Immature Granulocytes: 0.02 K/uL (ref 0.00–0.07)
Basophils Absolute: 0 K/uL (ref 0.0–0.1)
Basophils Relative: 0 %
Eosinophils Absolute: 0.2 K/uL (ref 0.0–0.5)
Eosinophils Relative: 5 %
HCT: 37.6 % — ABNORMAL LOW (ref 39.0–52.0)
Hemoglobin: 12.4 g/dL — ABNORMAL LOW (ref 13.0–17.0)
Immature Granulocytes: 0 %
Lymphocytes Relative: 21 %
Lymphs Abs: 1.1 K/uL (ref 0.7–4.0)
MCH: 33.9 pg (ref 26.0–34.0)
MCHC: 33 g/dL (ref 30.0–36.0)
MCV: 102.7 fL — ABNORMAL HIGH (ref 80.0–100.0)
Monocytes Absolute: 0.5 K/uL (ref 0.1–1.0)
Monocytes Relative: 11 %
Neutro Abs: 3.1 K/uL (ref 1.7–7.7)
Neutrophils Relative %: 63 %
Platelet Count: 119 K/uL — ABNORMAL LOW (ref 150–400)
RBC: 3.66 MIL/uL — ABNORMAL LOW (ref 4.22–5.81)
RDW: 14.2 % (ref 11.5–15.5)
WBC Count: 5 K/uL (ref 4.0–10.5)
nRBC: 0 % (ref 0.0–0.2)

## 2024-05-25 LAB — CMP (CANCER CENTER ONLY)
ALT: 14 U/L (ref 0–44)
AST: 19 U/L (ref 15–41)
Albumin: 3.9 g/dL (ref 3.5–5.0)
Alkaline Phosphatase: 85 U/L (ref 38–126)
Anion gap: 11 (ref 5–15)
BUN: 16 mg/dL (ref 8–23)
CO2: 27 mmol/L (ref 22–32)
Calcium: 8.9 mg/dL (ref 8.9–10.3)
Chloride: 107 mmol/L (ref 98–111)
Creatinine: 0.74 mg/dL (ref 0.61–1.24)
GFR, Estimated: >60 mL/min
Glucose, Bld: 130 mg/dL — ABNORMAL HIGH (ref 70–99)
Potassium: 3.9 mmol/L (ref 3.5–5.1)
Sodium: 145 mmol/L (ref 135–145)
Total Bilirubin: 0.9 mg/dL (ref 0.0–1.2)
Total Protein: 6.5 g/dL (ref 6.5–8.1)

## 2024-05-25 LAB — LACTATE DEHYDROGENASE: LDH: 225 U/L (ref 105–235)

## 2024-05-25 LAB — SAVE SMEAR(SSMR), FOR PROVIDER SLIDE REVIEW

## 2024-05-25 NOTE — Progress Notes (Signed)
 " Hematology and Oncology Follow Up Visit  Connor [name] [id number] 1934-05-04 89 y.o. 05/25/2024   Principle Diagnosis:  Mild thrombocytopenia  Current Therapy:   Observation     Interim History:  [name] is back for follow-up.  This is a second office visit.  We saw him a year ago.  At that time, he was seen by Lauraine.  Everything looked all right with his blood counts.  Since then, he has been doing pretty well.  He is on quite a few medications.  He does take Humira.  He has had no problems with infections.  Has had no bleeding.  Has had no bruising.  He has had no weight loss or weight gain.  His appetite has been pretty good.  He is somewhat hard of hearing.  He has had no fever.  He has had no rashes.  Has had no leg swelling.  Overall, I would have say that his performance status is probably ECOG 1.  Medications: Current Medications[1]  Allergies: Allergies[2]  Past Medical History, Surgical history, Social history, and Family History were reviewed and updated.  Review of Systems: Review of Systems  Constitutional: Negative.   HENT:  Negative.    Eyes: Negative.   Respiratory: Negative.    Cardiovascular: Negative.   Gastrointestinal: Negative.   Endocrine: Negative.   Genitourinary: Negative.    Musculoskeletal: Negative.   Skin: Negative.   Neurological: Negative.   Hematological: Negative.   Psychiatric/Behavioral: Negative.      Physical Exam:  height is 6' 1 (1.854 m) and weight is 216 lb 12.8 oz (98.3 kg). His oral temperature is 97.9 F (36.6 C). His blood pressure is 138/61 and his pulse is 66. His respiration is 20 and oxygen saturation is 98%.   Wt Readings from Last 3 Encounters:  05/25/24 216 lb 12.8 oz (98.3 kg)  04/19/24 212 lb 12.8 oz (96.5 kg)  03/22/24 211 lb (95.7 kg)    Physical Exam Vitals reviewed.  HENT:     Head: Normocephalic and atraumatic.  Eyes:     Pupils: Pupils are equal, round, and reactive to light.   Cardiovascular:     Rate and Rhythm: Normal rate and regular rhythm.     Heart sounds: Normal heart sounds.  Pulmonary:     Effort: Pulmonary effort is normal.     Breath sounds: Normal breath sounds.  Abdominal:     General: Bowel sounds are normal.     Palpations: Abdomen is soft.  Musculoskeletal:        General: No tenderness or deformity. Normal range of motion.     Cervical back: Normal range of motion.  Lymphadenopathy:     Cervical: No cervical adenopathy.  Skin:    General: Skin is warm and dry.     Findings: No erythema or rash.  Neurological:     Mental Status: He is alert and oriented to person, place, and time.  Psychiatric:        Behavior: Behavior normal.        Thought Content: Thought content normal.        Judgment: Judgment normal.      Lab Results  Component Value Date   WBC 5.0 05/25/2024   HGB 12.4 (L) 05/25/2024   HCT 37.6 (L) 05/25/2024   MCV 102.7 (H) 05/25/2024   PLT 119 (L) 05/25/2024     Chemistry      Component Value Date/Time   NA 145 05/25/2024 0945  NA 142 04/13/2024 0000   K 3.9 05/25/2024 0945   CL 107 05/25/2024 0945   CO2 27 05/25/2024 0945   BUN 16 05/25/2024 0945   BUN 17 04/13/2024 0000   CREATININE 0.74 05/25/2024 0945   CREATININE 0.69 (L) 02/26/2016 1022   GLU 101 04/13/2024 0000      Component Value Date/Time   CALCIUM  8.9 05/25/2024 0945   ALKPHOS 85 05/25/2024 0945   AST 19 05/25/2024 0945   ALT 14 05/25/2024 0945   BILITOT 0.9 05/25/2024 0945       Impression and Plan: Mr. Drudge is a very nice 89 year old white male.  He has mild thrombocytopenia.  He has mild anemia.  I have to believe that a lot of this is from medication.  He also has rheumatoid arthritis.  He is on Humira.  Again, everything looks okay from my point of view.  Again he is not symptomatic with his blood counts.  I looked at his blood under the microscope.  I really did not did not see anything that looks suspicious.  He did not have  any immature myeloid cells.  His platelets were adequate in size and granulation.  His red cells appeared normal.  There is no nucleated red blood cells.  I saw no rouleaux formation.  I would like to get him back in about 4 months.  I suppose at his age, he could always have an element of myelodysplasia.  However, the only way that we would know this is to do a bone marrow biopsy on him.  I just do not think we have to put into an invasive procedure at this point.  He served our country.  He was in the [employer]  I thanked him for his service.  He is a true American hero.  We will certainly do all what that we can for him.   Maude JONELLE Crease, MD 1/20/202611:33 AM    [1]  Current Outpatient Medications:    atorvastatin  (LIPITOR) 10 MG tablet, TAKE 1 TABLET BY MOUTH EVERY DAY, Disp: 90 tablet, Rfl: 1   Carboxymethylcellulose Sodium (THERATEARS OP), Apply 1 drop to eye daily as needed (dry eyes)., Disp: , Rfl:    Cholecalciferol  (VITAMIN D3) 5000 UNITS TABS, Take 5,000 Units by mouth every morning., Disp: , Rfl:    CRANBERRY PO, Take 9,600 mg by mouth 2 (two) times daily. 4800 mg each, Disp: , Rfl:    HUMIRA PEN 40 MG/0.4ML PNKT, Inject 40 mg as directed every 7 (seven) days. Every Tuesday, Disp: , Rfl: 6   lisinopril  (ZESTRIL ) 40 MG tablet, Take 1 tablet (40 mg total) by mouth daily., Disp: 90 tablet, Rfl: 3   Melatonin 10 MG CHEW, Chew 3 Pieces of gum by mouth at bedtime. gummy, Disp: , Rfl:    metoprolol  succinate (TOPROL -XL) 50 MG 24 hr tablet, Take 1 tablet (50 mg total) by mouth 2 (two) times daily. Take with or immediately following a meal., Disp: 180 tablet, Rfl: 3   Multiple Vitamins-Minerals (PRESERVISION AREDS 2) CAPS, Take 1 capsule by mouth 2 (two) times a day., Disp: , Rfl:    MYRBETRIQ 25 MG TB24 tablet, Take 25 mg by mouth daily., Disp: , Rfl:    pantoprazole  (PROTONIX ) 40 MG tablet, TAKE 1 TABLET BY MOUTH EVERY DAY, Disp: 90 tablet, Rfl: 1   senna (SENOKOT) 8.6 MG TABS tablet,  Take 2 tablets by mouth at bedtime., Disp: , Rfl:    tamsulosin  (FLOMAX ) 0.4 MG CAPS capsule,  Take 1 capsule (0.4 mg total) by mouth daily after supper., Disp: 30 capsule, Rfl: 5   vitamin B-12 (CYANOCOBALAMIN ) 500 MCG tablet, Take 500 mcg by mouth every evening. , Disp: , Rfl:    cephALEXin  (KEFLEX ) 500 MG capsule, Take 2,000 mg by mouth as directed. Dental appointments (Patient not taking: Reported on 05/25/2024), Disp: , Rfl:    Testosterone  20.25 MG/ACT (1.62%) GEL, Apply 1 Pump topically once a week. Apply to shoulders (Patient not taking: Reported on 05/25/2024), Disp: , Rfl:  [2]  Allergies Allergen Reactions   Penicillins Itching, Swelling and Other (See Comments)    Did it involve swelling of the face/tongue/throat, SOB, or low BP? No Did it involve sudden or severe rash/hives, skin peeling, or any reaction on the inside of your mouth or nose? No Did you need to seek medical attention at a hospital or doctor's office? No When did it last happen?      30 + years If all above answers are NO, may proceed with cephalosporin use.     "

## 2024-05-31 ENCOUNTER — Ambulatory Visit: Admitting: Physical Therapy

## 2024-06-07 ENCOUNTER — Ambulatory Visit: Admitting: Physical Therapy

## 2024-06-09 ENCOUNTER — Encounter: Payer: Self-pay | Admitting: Podiatry

## 2024-06-09 ENCOUNTER — Ambulatory Visit: Admitting: Podiatry

## 2024-06-09 ENCOUNTER — Encounter (INDEPENDENT_AMBULATORY_CARE_PROVIDER_SITE_OTHER): Admitting: Ophthalmology

## 2024-06-09 DIAGNOSIS — B351 Tinea unguium: Secondary | ICD-10-CM

## 2024-06-09 DIAGNOSIS — I10 Essential (primary) hypertension: Secondary | ICD-10-CM

## 2024-06-09 DIAGNOSIS — H43813 Vitreous degeneration, bilateral: Secondary | ICD-10-CM | POA: Diagnosis not present

## 2024-06-09 DIAGNOSIS — H35033 Hypertensive retinopathy, bilateral: Secondary | ICD-10-CM | POA: Diagnosis not present

## 2024-06-09 DIAGNOSIS — H353112 Nonexudative age-related macular degeneration, right eye, intermediate dry stage: Secondary | ICD-10-CM | POA: Diagnosis not present

## 2024-06-09 DIAGNOSIS — H353221 Exudative age-related macular degeneration, left eye, with active choroidal neovascularization: Secondary | ICD-10-CM | POA: Diagnosis not present

## 2024-06-09 NOTE — Progress Notes (Addendum)
 This patient presents to the office with chief complaint of long thick painful nails.  Patient says the nails are painful walking and wearing shoes.  This patient is unable to self treat.  This patient is unable to trim his  nails since he is unable to reach his  nails.  he presents to the office for preventative foot care services.  General Appearance  Alert, conversant and in no acute stress.  Vascular  Dorsalis pedis and posterior tibial  pulses are palpable  bilaterally.  Capillary return is within normal limits  bilaterally. Temperature is within normal limits  bilaterally.  Neurologic  Senn-Weinstein monofilament wire test within normal limits  bilaterally. Muscle power within normal limits bilaterally.  Nails Thick disfigured discolored nails with subungual debris  from hallux to fifth toes bilaterally. No evidence of bacterial infection or drainage bilaterally.  Orthopedic  No limitations of motion  feet .  No crepitus or effusions noted.  No bony pathology or digital deformities noted. HAV  B/L.  Skin  normotropic skin with no porokeratosis noted bilaterally.  No signs of infections or ulcers noted.     Onychomycosis  Nails  B/L.  Pain in right toes  Pain in left toes  Debridement of nails both feet followed trimming the nails with dremel tool.    RTC  10 weeks    Cordella Bold DPM  [facility]

## 2024-06-14 ENCOUNTER — Ambulatory Visit: Attending: Family Medicine | Admitting: Physical Therapy

## 2024-06-21 ENCOUNTER — Ambulatory Visit: Admitting: Physical Therapy

## 2024-06-23 ENCOUNTER — Ambulatory Visit: Admitting: Cardiology

## 2024-06-30 ENCOUNTER — Ambulatory Visit: Admitting: Physical Therapy

## 2024-07-06 ENCOUNTER — Encounter (INDEPENDENT_AMBULATORY_CARE_PROVIDER_SITE_OTHER): Admitting: Ophthalmology

## 2024-07-07 ENCOUNTER — Encounter (INDEPENDENT_AMBULATORY_CARE_PROVIDER_SITE_OTHER): Admitting: Ophthalmology

## 2024-09-08 ENCOUNTER — Ambulatory Visit: Admitting: Podiatry

## 2024-09-20 ENCOUNTER — Ambulatory Visit: Admitting: Family Medicine

## 2024-09-22 ENCOUNTER — Inpatient Hospital Stay: Admitting: Hematology & Oncology

## 2024-09-22 ENCOUNTER — Inpatient Hospital Stay
